# Patient Record
Sex: Female | Born: 1975 | Race: Black or African American | Hispanic: No | Marital: Single | Smoking: Former smoker
Health system: Southern US, Community
[De-identification: ages and names within clinical notes are randomized; demographics above are authoritative.]

## PROBLEM LIST (undated history)

## (undated) DIAGNOSIS — K5090 Crohn's disease, unspecified, without complications: Secondary | ICD-10-CM

## (undated) DIAGNOSIS — C50919 Malignant neoplasm of unspecified site of unspecified female breast: Secondary | ICD-10-CM

## (undated) DIAGNOSIS — K625 Hemorrhage of anus and rectum: Secondary | ICD-10-CM

## (undated) DIAGNOSIS — R197 Diarrhea, unspecified: Secondary | ICD-10-CM

## (undated) DIAGNOSIS — C801 Malignant (primary) neoplasm, unspecified: Secondary | ICD-10-CM

## (undated) DIAGNOSIS — D500 Iron deficiency anemia secondary to blood loss (chronic): Secondary | ICD-10-CM

## (undated) DIAGNOSIS — E559 Vitamin D deficiency, unspecified: Secondary | ICD-10-CM

## (undated) DIAGNOSIS — D259 Leiomyoma of uterus, unspecified: Secondary | ICD-10-CM

## (undated) HISTORY — DX: Crohn's disease, unspecified, without complications: K50.90

## (undated) HISTORY — DX: Diarrhea, unspecified: R19.7

## (undated) HISTORY — DX: Hemorrhage of anus and rectum: K62.5

## (undated) HISTORY — DX: Leiomyoma of uterus, unspecified: D25.9

## (undated) HISTORY — DX: Iron deficiency anemia secondary to blood loss (chronic): D50.0

## (undated) HISTORY — DX: Vitamin D deficiency, unspecified: E55.9

---

## 1998-01-23 ENCOUNTER — Emergency Department (HOSPITAL_COMMUNITY): Admission: EM | Admit: 1998-01-23 | Discharge: 1998-01-23 | Payer: Self-pay | Admitting: Emergency Medicine

## 1998-06-06 ENCOUNTER — Emergency Department (HOSPITAL_COMMUNITY): Admission: EM | Admit: 1998-06-06 | Discharge: 1998-06-06 | Payer: Self-pay | Admitting: Emergency Medicine

## 1998-06-06 ENCOUNTER — Inpatient Hospital Stay (HOSPITAL_COMMUNITY): Admission: AD | Admit: 1998-06-06 | Discharge: 1998-06-06 | Payer: Self-pay | Admitting: *Deleted

## 1998-06-09 ENCOUNTER — Inpatient Hospital Stay (HOSPITAL_COMMUNITY): Admission: AD | Admit: 1998-06-09 | Discharge: 1998-06-09 | Payer: Self-pay | Admitting: Obstetrics

## 1998-06-15 ENCOUNTER — Inpatient Hospital Stay (HOSPITAL_COMMUNITY): Admission: AD | Admit: 1998-06-15 | Discharge: 1998-06-15 | Payer: Self-pay | Admitting: Obstetrics

## 1998-08-27 ENCOUNTER — Inpatient Hospital Stay (HOSPITAL_COMMUNITY): Admission: AD | Admit: 1998-08-27 | Discharge: 1998-08-27 | Payer: Self-pay | Admitting: Obstetrics

## 1998-10-28 ENCOUNTER — Inpatient Hospital Stay (HOSPITAL_COMMUNITY): Admission: AD | Admit: 1998-10-28 | Discharge: 1998-10-28 | Payer: Self-pay | Admitting: Obstetrics

## 1999-01-24 ENCOUNTER — Inpatient Hospital Stay (HOSPITAL_COMMUNITY): Admission: AD | Admit: 1999-01-24 | Discharge: 1999-01-26 | Payer: Self-pay | Admitting: Obstetrics

## 1999-12-19 ENCOUNTER — Encounter (INDEPENDENT_AMBULATORY_CARE_PROVIDER_SITE_OTHER): Payer: Self-pay

## 1999-12-19 ENCOUNTER — Other Ambulatory Visit: Admission: RE | Admit: 1999-12-19 | Discharge: 1999-12-19 | Payer: Self-pay | Admitting: Obstetrics

## 2002-04-09 ENCOUNTER — Encounter: Payer: Self-pay | Admitting: Emergency Medicine

## 2002-04-09 ENCOUNTER — Emergency Department (HOSPITAL_COMMUNITY): Admission: EM | Admit: 2002-04-09 | Discharge: 2002-04-10 | Payer: Self-pay | Admitting: Emergency Medicine

## 2002-04-15 ENCOUNTER — Emergency Department (HOSPITAL_COMMUNITY): Admission: EM | Admit: 2002-04-15 | Discharge: 2002-04-15 | Payer: Self-pay | Admitting: Emergency Medicine

## 2004-04-07 ENCOUNTER — Emergency Department (HOSPITAL_COMMUNITY): Admission: EM | Admit: 2004-04-07 | Discharge: 2004-04-07 | Payer: Self-pay | Admitting: Emergency Medicine

## 2004-12-16 ENCOUNTER — Encounter: Payer: Self-pay | Admitting: Gastroenterology

## 2004-12-19 ENCOUNTER — Encounter (INDEPENDENT_AMBULATORY_CARE_PROVIDER_SITE_OTHER): Payer: Self-pay | Admitting: *Deleted

## 2004-12-19 ENCOUNTER — Encounter (INDEPENDENT_AMBULATORY_CARE_PROVIDER_SITE_OTHER): Payer: Self-pay | Admitting: Specialist

## 2004-12-19 ENCOUNTER — Encounter: Payer: Self-pay | Admitting: Gastroenterology

## 2004-12-19 ENCOUNTER — Ambulatory Visit (HOSPITAL_COMMUNITY): Admission: RE | Admit: 2004-12-19 | Discharge: 2004-12-19 | Payer: Self-pay | Admitting: Gastroenterology

## 2005-06-13 ENCOUNTER — Encounter: Payer: Self-pay | Admitting: Gastroenterology

## 2005-12-04 ENCOUNTER — Encounter: Payer: Self-pay | Admitting: Gastroenterology

## 2005-12-26 ENCOUNTER — Encounter: Payer: Self-pay | Admitting: Gastroenterology

## 2006-01-01 ENCOUNTER — Encounter: Payer: Self-pay | Admitting: Gastroenterology

## 2006-01-18 ENCOUNTER — Encounter: Admission: RE | Admit: 2006-01-18 | Discharge: 2006-01-18 | Payer: Self-pay | Admitting: Gastroenterology

## 2006-03-06 ENCOUNTER — Encounter: Payer: Self-pay | Admitting: Gastroenterology

## 2006-06-22 ENCOUNTER — Encounter: Payer: Self-pay | Admitting: Gastroenterology

## 2007-06-24 ENCOUNTER — Emergency Department (HOSPITAL_COMMUNITY): Admission: EM | Admit: 2007-06-24 | Discharge: 2007-06-24 | Payer: Self-pay | Admitting: Emergency Medicine

## 2009-08-27 ENCOUNTER — Encounter (INDEPENDENT_AMBULATORY_CARE_PROVIDER_SITE_OTHER): Payer: Self-pay | Admitting: *Deleted

## 2009-08-27 ENCOUNTER — Ambulatory Visit: Payer: Self-pay | Admitting: Gastroenterology

## 2009-08-27 DIAGNOSIS — K625 Hemorrhage of anus and rectum: Secondary | ICD-10-CM

## 2009-08-27 DIAGNOSIS — K5090 Crohn's disease, unspecified, without complications: Secondary | ICD-10-CM | POA: Insufficient documentation

## 2009-08-30 ENCOUNTER — Ambulatory Visit: Payer: Self-pay | Admitting: Gastroenterology

## 2009-08-30 LAB — CONVERTED CEMR LAB
ALT: 12 units/L (ref 0–35)
AST: 15 units/L (ref 0–37)
Albumin: 3.4 g/dL — ABNORMAL LOW (ref 3.5–5.2)
Alkaline Phosphatase: 103 units/L (ref 39–117)
BUN: 8 mg/dL (ref 6–23)
Basophils Absolute: 0 10*3/uL (ref 0.0–0.1)
Basophils Relative: 0.4 % (ref 0.0–3.0)
Bilirubin, Direct: 0.1 mg/dL (ref 0.0–0.3)
CO2: 29 meq/L (ref 19–32)
Calcium: 9 mg/dL (ref 8.4–10.5)
Chloride: 106 meq/L (ref 96–112)
Creatinine, Ser: 0.7 mg/dL (ref 0.4–1.2)
Eosinophils Absolute: 0.1 10*3/uL (ref 0.0–0.7)
Eosinophils Relative: 0.9 % (ref 0.0–5.0)
Ferritin: 17 ng/mL (ref 10.0–291.0)
Folate: 9.2 ng/mL
GFR calc non Af Amer: 127.01 mL/min (ref >60)
Glucose, Bld: 88 mg/dL (ref 70–99)
HCT: 35.3 % — ABNORMAL LOW (ref 36.0–46.0)
Hemoglobin: 11.6 g/dL — ABNORMAL LOW (ref 12.0–15.0)
Iron: 32 ug/dL — ABNORMAL LOW (ref 42–145)
Lymphocytes Relative: 25.6 % (ref 12.0–46.0)
Lymphs Abs: 2 10*3/uL (ref 0.7–4.0)
MCHC: 33 g/dL (ref 30.0–36.0)
MCV: 82.6 fL (ref 78.0–100.0)
Magnesium: 2.3 mg/dL (ref 1.5–2.5)
Monocytes Absolute: 0.9 10*3/uL (ref 0.1–1.0)
Monocytes Relative: 11.1 % (ref 3.0–12.0)
Neutro Abs: 4.8 10*3/uL (ref 1.4–7.7)
Neutrophils Relative %: 62 % (ref 43.0–77.0)
Platelets: 352 10*3/uL (ref 150.0–400.0)
Potassium: 4.1 meq/L (ref 3.5–5.1)
RBC: 4.28 M/uL (ref 3.87–5.11)
RDW: 14 % (ref 11.5–14.6)
Saturation Ratios: 10.5 % — ABNORMAL LOW (ref 20.0–50.0)
Sed Rate: 11 mm/hr (ref 0–22)
Sodium: 141 meq/L (ref 135–145)
TSH: 0.89 microintl units/mL (ref 0.35–5.50)
Total Bilirubin: 0.7 mg/dL (ref 0.3–1.2)
Total Protein: 7.1 g/dL (ref 6.0–8.3)
Transferrin: 217.4 mg/dL (ref 212.0–360.0)
Vitamin B-12: 600 pg/mL (ref 211–911)
WBC: 7.7 10*3/uL (ref 4.5–10.5)

## 2009-09-01 ENCOUNTER — Encounter: Payer: Self-pay | Admitting: Gastroenterology

## 2009-09-02 ENCOUNTER — Telehealth: Payer: Self-pay | Admitting: Gastroenterology

## 2009-09-13 ENCOUNTER — Telehealth (INDEPENDENT_AMBULATORY_CARE_PROVIDER_SITE_OTHER): Payer: Self-pay | Admitting: *Deleted

## 2010-05-10 NOTE — Letter (Signed)
Summary: [facility]  [facility]   Imported By: Phillis Knack 09/29/2009 15:21:19  _____________________________________________________________________  External Attachment:    Type:   Image     Comment:   External Document

## 2010-05-10 NOTE — Letter (Signed)
Summary: Patient Notice- Colon Biospy Results  [facility]  [place], [place] [address]   Phone: [phone]  Fax: [phone]        Sep 01, 2009 MRN: [id number]    [name] [place], [place]  [address]    Dear [name],  I am pleased to inform you that the biopsies taken during your recent colonoscopy did not show any evidence of cancer upon pathologic examination.  Additional information/recommendations:  __No further action is needed at this time.  Please follow-up with      your primary care physician for your other healthcare needs.  _x_Please call [phone] to schedule a return visit to review      your condition.  _x_Continue with the treatment plan as outlined on the day of your      exam.  __You should have a repeat colonoscopy examination for this problem           in _ years.  Please call us if you are having persistent problems or have questions about your condition that have not been fully answered at this time.  Sincerely,  Sable Feil MD [facility]   This letter has been electronically signed by your physician.  Appended Document: Patient Notice- Colon Biospy Results letter mailed.

## 2010-05-10 NOTE — Progress Notes (Signed)
Summary: instructions  Phone Note Call from Patient Call back at Home Phone [phone]   Caller: Patient Call For: Dr. Sharlett Iles Reason for Call: Talk to Nurse Summary of Call: pt had COL Monday and thinks she was told to stop "something", but she doesnt recall what the "something" was Initial call taken by: Lucien Mons,  Sep 02, 2009 8:20 AM  Follow-up for Phone Call        LM for pt to call.   Butch Penny Surface RN  Sep 02, 2009 8:33 AM  Pt asking if she still needs to use the canasa suppositories?  Pt has noticed that her urine is darker than normal.  No other urinary symptoms.  Follow-up by: Alberteen Spindle RN,  Sep 02, 2009 8:40 AM  Additional Follow-up for Phone Call Additional follow up Details #1::        stop supp...see me next week Additional Follow-up by: Sable Feil MD Rubie Maid 9:28 AM    Additional Follow-up for Phone Call Additional follow up Details #2::    patient was wonder is the flagyl was causing the darker urine not the supp.  Follow-up by: Bernita Buffy CMA Deborra Medina),  Sep 02, 2009 4:18 PM  Additional Follow-up for Phone Call Additional follow up Details #3:: Details for Additional Follow-up Action Taken: talked with pt.  She states urine has returned to normal color.  Has return OV scheduled. Additional Follow-up by: Alberteen Spindle RN,  September 08, 2009 11:48 AM

## 2010-05-10 NOTE — Letter (Signed)
Summary: [facility]  [facility]   Imported By: Phillis Knack 09/29/2009 15:22:03  _____________________________________________________________________  External Attachment:    Type:   Image     Comment:   External Document

## 2010-05-10 NOTE — Procedures (Signed)
Summary: Colonoscopy  Patient: [name] Note: All result statuses are Final unless otherwise noted.  Tests: (1) Colonoscopy (COL)   COL Colonoscopy           [facility].     [place], [place]  [id number]           COLONOSCOPY PROCEDURE REPORT           PATIENT:  [name], [name]  MR#:  [id number]     BIRTHDATE:  [birth year], 33 yrs. old  GENDER:  female     ENDOSCOPIST:  Loralee Pacas. Sharlett Iles, MD, [facility]     REF. BY:     PROCEDURE DATE:  08/30/2009     PROCEDURE:  Colonoscopy with biopsy     ASA CLASS:  Class II     INDICATIONS:  FOBT positive stool, Crohn's disease, unexplained     diarrhea     MEDICATIONS:   Fentanyl 100 mcg IV, Versed 9 mg IV           DESCRIPTION OF PROCEDURE:   After the risks benefits and     alternatives of the procedure were thoroughly explained, informed     consent was obtained.  Digital rectal exam was performed and     revealed no abnormalities.   The LB CF-H180AL [id number] endoscope     was introduced through the anus and advanced to the terminal ileum     which was intubated for a short distance, without limitations.     The quality of the prep was excellent, using MoviPrep.  The     instrument was then slowly withdrawn as the colon was fully     examined.     <<PROCEDUREIMAGES>>     FINDINGS:  There were mucosal changes consistent with Crohn's     disease. SEVERE COLITIS FROM MID SIGMOID AREA TO     CECUM.GRANULAR,ULCERATED,PSEUDO-POLYS AND MARKED EXUDATE.BX.     DONE.THE ILEUM APPEARS NORMAL AS DOES THE RECTUM AND DISTAL     SIGMOID AREA.   Retrof     lexed views in the rectum revealed no abnormalities.    The scope     was then withdrawn from the patient and the procedure completed.           COMPLICATIONS:  None     ENDOSCOPIC IMPRESSION:     1) Colitis - Crohn's     SEVERE CROHN'S COLITIS AND PROBABLE C.DIFF INFECTION,R/O CMV.     RECOMMENDATIONS:     #1.METRONIDAZOLE 500 MG BID FOR 2 WEEKS.#50, REFILL X  3.     #2.CONTINUE PO AMINOSAICYLATES.CONSIDER 6MP RX ON RTC VISIT 2     WEEKS,     REPEAT EXAM:  No           ______________________________     Loralee Pacas. Sharlett Iles, MD, [facility]           CC:  Freda Munro, MD           n.     Lorrin Mais:   Loralee Pacas. Patterson at 08/30/2009 04:06 PM           [name], [phone]  Note: An exclamation mark (!) indicates a result that was not dispersed into the flowsheet. Document Creation Date: 08/30/2009 4:07 PM _______________________________________________________________________  (1) Order result status: Final Collection or observation date-time: 08/30/2009 15:56 Requested date-time:  Receipt date-time:  Reported date-time:  Referring Physician:   Ordering Physician:  Verl Blalock [id number]) Specimen Source:  Source: Tawanna Cooler Order Number: [id number] Lab site:

## 2010-05-10 NOTE — Letter (Signed)
Summary: [facility]  [facility]   Imported By: Phillis Knack 09/29/2009 15:22:50  _____________________________________________________________________  External Attachment:    Type:   Image     Comment:   External Document

## 2010-05-10 NOTE — Progress Notes (Signed)
  Phone Note Other Incoming   Request: Send information Summary of Call: Records received from [facility]. 31 pages of records forwarded to Dr. Sharlett Iles for review.

## 2010-05-10 NOTE — Procedures (Signed)
Summary: Colon and path   Colonoscopy  Procedure date:  12/19/2004  Findings:      Location:  [facility].    NAME:  [name], [name]              ACCOUNT NO.:  [id number]   MEDICAL RECORD NO.:  [id number]          PATIENT TYPE:  AMB   LOCATION:  ENDO                         FACILITY:  [facility]   PHYSICIAN:  Lear Ng, MDDATE OF BIRTH:  [birth year]   DATE OF PROCEDURE:  12/19/2004  DATE OF DISCHARGE:                                 OPERATIVE REPORT   PROCEDURE:  Colonoscopy.   INDICATIONS FOR PROCEDURE:  Rectal bleeding.   HISTORY:  The patient is a 35 year old black female who presented to my  clinic on December 16, 2004, with a two month history of rectal bleeding  with associated abdominal pain and diarrhea along with a 20 pound weight  loss.  She had a C. diff test which was negative by her primary care  physician and had negative celiac sprue panel done by her primary care  physician, and she was sent to my clinic for further evaluation.  Upon  further discussion, she had a negative travel history and denied tenesmus.  She did report the onset of arthritis in her left foot which was new, as  well.  She is unsure of her family history since she is adopted.  Blood  counts done by my office revealed a hematocrit of 32 with an MCV of 80.  She  has undergone this colonoscopy to evaluate for possible inflammatory bowel  disease.   DESCRIPTION OF PROCEDURE:  Informed consent was obtained from the patient  prior to initiation of procedure, discussing the risks, benefits, and  alternatives.  A PCF-160AL colonoscope was used for this procedure.  Conscious sedation was used and a total of 60 mg Demerol and 6 mg Versed was  given.   FINDINGS:  Rectal exam was normal with normal rectal tone and no stool on  the gloved finger.  The colonoscope was inserted into a well prepped colon  and advanced to the cecum where the ileocecal valve and appendiceal orifice  were  identified.  The terminal ileum was intubated which was normal in  appearance and random biopsies were taken in the terminal ileum.  Careful  withdrawal of the colonoscope revealed scattered ulcers throughout the colon  mainly concentrated in the ascending, transverse, descending, and proximal  sigmoid colon.  Edema was noted in these areas as well as friability.  These  areas represented lesions with areas of normal mucosa surrounding these  scattered ulcers.  Random biopsies were taken throughout the colon.  The  distal sigmoid and rectosigmoid junction as well as the rectum was spared  with normal vascular pattern and no ulcers or edema noted.  Retroflexion was  normal.  The colonoscope was withdrawn and confirmed the above findings.   DIAGNOSIS:  Colitis likely secondary to Crohn's disease - status post  multiple random biopsies.   PLAN:  1.  Start Asacol 800 mg t.i.d.  2.  Metronidazole 500 mg t.i.d. x 7 days.  3.  The patient will follow up  in my clinic at [place] in 2-3 weeks.      Lear Ng, MD  Electronically Signed     VCS/MEDQ  D:  12/19/2004  T:  12/19/2004  Job:  [id number]      [facility] Surgical Pathology - STATUS: Final             By: Leatrice Jewels,      Perform Date: 11Sep06 00:00  Ordered By: Golden Pop MD , PROVIDER         Ordered Date: 11Sep06 16:37  Facility: [facility]                              Department: [place]  Service Report Text  [facility]   [place], [place] [address]   [phone]    REPORT OF SURGICAL PATHOLOGY    Case #: [id number]   Patient Name: [name]   PID: [id number]   Pathologist: Jaquelyn Bitter A. Rodney Cruise, MD   DOB/Age 10-08-1975 (Age: 63) Gender: F   Date Taken: 12/19/2004   Date Received: 12/19/2004    FINAL DIAGNOSIS    ***MICROSCOPIC EXAMINATION AND DIAGNOSIS***    1. TERMINAL ILEUM, BIOPSIES: FRAGMENTS OF SMALL INTESTINAL   MUCOSA WITH REACTIVE LYMPHOID AGGREGATES.  NO ACTIVE ILEITIS,   GRANULOMAS OR DYSPLASIA IDENTIFIED.    2. COLON, RIGHT, BIOPSIES: MILD ACTIVE CHRONIC MUCOSAL COLITIS.   NO DYSPLASIA IDENTIFIED.    3. COLON, TRANSVERSE, BIOPSIES: ACTIVE CHRONIC MUCOSAL COLITIS.   NO DYSPLASIA IDENTIFIED.    4. COLON, LEFT, BIOPSIES: ACTIVE CHRONIC MUCOSAL COLITIS WITH   FOCAL EXTENSION OF INFLAMMATION TO SUBMUCOSAL TISSUES. NO   GRANULOMAS OR DYSPLASIA IDENTIFIED.    5. RECTUM, BIOPSIES: FRAGMENTS OF BENIGN COLONIC MUCOSA WITH   FOCAL REACTIVE LYMPHOID AGGREGATES. NO ACTIVE MUCOSAL   INFLAMMATION, GRANULOMAS OR DYSPLASIA IDENTIFIED.    COMMENT   The right, transverse and left colon biopsies show fragments of   colonic mucosa with features consistent with active chronic   idiopathic inflammatory bowel disease. Although in some of the   biopsy fragments the changes are diffuse, much of the   inflammation including ulceration is focal and in the left colon   biopsy there is extension of inflammation into the submucosa.   These features are more in favor of Crohn disease than ulcerative   colitis. No dysplasia is identified. Clinical and endoscopic   correlation would be helpful. (EAA:glk 12-20-04)    gk   Date Reported: 12/20/2004 Louanne Belton. Rodney Cruise, MD   *** Electronically Signed Out By EAA ***    Clinical information   Blood in stool; colitis (cm)    specimen(s) obtained   1: Ileum, biopsy, terminal   2: Colon, biopsy, right side   3: Colon, biopsy, transverse   4: Colon, biopsy, left side   5: Rectum, biopsy    Gross Description   1. Received in formalin are tan, soft tissue fragments that are   submitted in toto. Number: multiple.   Size: each 0.3 cm.    2. Received in formalin are tan, soft tissue fragments that are   submitted in toto. Number: multiple.   Size: 0.2 to 0.3 cm.    3. Received in formalin are tan, soft tissue fragments that are   submitted in toto. Number: two.   Size: each 0.3 cm.    4. Received in formalin  are  tan, soft tissue fragments that are   submitted in toto. Number: multiple.   Size: 0.2 to 0.3 cm.    5. Received in formalin are tan, soft tissue fragments that are   submitted in toto. Number: two.   Size: each 0.3 cm. (GP:caf 12/19/04)    cf/

## 2010-05-10 NOTE — Assessment & Plan Note (Signed)
Summary: flare up colitis...erm   History of Present Illness Visit Type: new patient Primary GI MD: Verl Blalock MD [place] Primary Provider: n/a Chief Complaint: crohn's flare, pt has seen Dr. Michail Sermon in the past, but has not seen him since 2007, last colonoscopy in 2006 History of Present Illness:   35 year old F. American female with a past history of Crohn's colitis diagnosed at time of colonoscopy in September 2006 by Dr. Michail Sermon at [facility]. His report is reviewed today as his pathologic report which confirmed granulomatous colitis. She had an excellent response to Asacol 800 mg t.i.d. which she took for several months, then self discontinued. She's had no problems until the last 4-6 weeks when she again has developed crampy lower abdominal pain, fatigue, distention, and watery diarrhea. Followup with Dr. Ouida Sills, her gynecologist, had been unremarkable.  She has suffered from recurrent oral stomatitis but denies current problems with this, denies arthritis, skin rashes, fever, chills, or other systemic complaints except for mild fatigue. She has normal menstrual periods and denies any chance she could be pregnant. She does use a fair amount of Advil at the time of her menses. Otherwise she is denies abuse of NSAIDs or alcohol. She was a heavy smoker until 3 years ago. Family history is noncontributory since she is adopted.   GI Review of Systems    Reports abdominal pain and  weight loss.   Weight loss of 10 pounds over 6 weeks.   Denies acid reflux, belching, bloating, chest pain, dysphagia with liquids, dysphagia with solids, heartburn, loss of appetite, nausea, vomiting, vomiting blood, and  weight gain.      Reports change in bowel habits and  constipation.     Denies anal fissure, black tarry stools, diarrhea, diverticulosis, fecal incontinence, heme positive stool, hemorrhoids, irritable bowel syndrome, jaundice, light color stool, liver problems, rectal bleeding, and  rectal  pain. Preventive Screening-Counseling & Management  Alcohol-Tobacco     Smoking Status: quit      Drug Use:  no.      Current Medications (verified): 1)  None  Allergies (verified): No Known Drug Allergies  Past History:  Past medical, surgical, family and social histories (including risk factors) reviewed for relevance to current acute and chronic problems.  Past Medical History: Crohns Disease  Past Surgical History: None  Family History: Reviewed history and no changes required. Family History of Breast Cancer: Mat Aunt No FH of Colon Cancer:  Social History: Reviewed history and no changes required. 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  Alcohol Use - no Illicit Drug Use - no Smoking Status:  quit Drug Use:  no  Review of Systems       The patient complains of fatigue.  The patient denies allergy/sinus, anemia, anxiety-new, arthritis/joint pain, back pain, blood in urine, breast changes/lumps, change in vision, confusion, cough, coughing up blood, depression-new, fainting, fever, headaches-new, hearing problems, heart murmur, heart rhythm changes, itching, menstrual pain, muscle pains/cramps, night sweats, nosebleeds, pregnancy symptoms, shortness of breath, skin rash, sleeping problems, sore throat, swelling of feet/legs, swollen lymph glands, thirst - excessive, urination - excessive, urination changes/pain, urine leakage, and voice change.   General:  Complains of fatigue and weight loss; denies fever, chills, sweats, anorexia, weakness, malaise, and sleep disorder; she has alleged 10 pound weight loss over the last 2 months.. Eyes:  Denies blurring, diplopia, irritation, discharge, vision loss, scotoma, eye pain, and photophobia. ENT:  Denies earache, ear discharge, tinnitus, decreased hearing, nasal  congestion, loss of smell, nosebleeds, sore throat, hoarseness, and difficulty swallowing. CV:  Denies chest pains, angina, palpitations,  syncope, dyspnea on exertion, orthopnea, PND, peripheral edema, and claudication. Resp:  Denies dyspnea at rest, dyspnea with exercise, cough, sputum, wheezing, coughing up blood, and pleurisy. GI:  Complains of abdominal pain, gas/bloating, and diarrhea; denies difficulty swallowing, pain on swallowing, nausea, indigestion/heartburn, vomiting, vomiting blood, jaundice, constipation, change in bowel habits, bloody BM's, black BMs, and fecal incontinence. GU:  Denies urinary burning, blood in urine, nocturnal urination, urinary frequency, urinary incontinence, abnormal vaginal bleeding, amenorrhea, menorrhagia, vaginal discharge, pelvic pain, genital sores, painful intercourse, and decreased libido. MS:  Denies joint pain / LOM, joint swelling, joint stiffness, joint deformity, low back pain, muscle weakness, muscle cramps, muscle atrophy, leg pain at night, leg pain with exertion, and shoulder pain / LOM hand / wrist pain (CTS). Derm:  Denies rash, itching, dry skin, hives, moles, warts, and unhealing ulcers. Neuro:  Complains of frequent headaches; denies weakness, paralysis, abnormal sensation, seizures, syncope, tremors, vertigo, transient blindness, frequent falls, difficulty walking, headache, sciatica, radiculopathy other:, restless legs, memory loss, and confusion. Psych:  Denies depression, anxiety, memory loss, suicidal ideation, hallucinations, paranoia, phobia, and confusion. Endo:  Denies cold intolerance, heat intolerance, polydipsia, polyphagia, polyuria, unusual weight change, and hirsutism. Heme:  Denies bruising, bleeding, enlarged lymph nodes, and pagophagia. Allergy:  Denies hives, rash, sneezing, hay fever, and recurrent infections.  Vital Signs:  Patient profile:   34 year old female Height:      65 inches Weight:      139 pounds BMI:     23.21 Pulse rate:   80 / minute Pulse rhythm:   regular BP sitting:   108 / 70  (left arm) Cuff size:   regular  Vitals Entered By:  Abelino Derrick CMA Deborra Medina) (Aug 27, 2009 9:47 AM)  Physical Exam  General:  Well developed, well nourished, no acute distress.healthy appearing.   Head:  Normocephalic and atraumatic. Eyes:  PERRLA, no icterus.exam deferred to patient's ophthalmologist.   Mouth:  No deformity or lesions, dentition normal. Neck:  Supple; no masses or thyromegaly.enlarged thyroid.   Lungs:  Clear throughout to auscultation. Heart:  Regular rate and rhythm; no murmurs, rubs,  or bruits. Abdomen:  Soft, nontender and nondistended. No masses, hepatosplenomegaly or hernias noted. Normal bowel sounds.Her abdomen is distended with some mild tenderness of the sigmoid colon. Rectal:  Normal exam.Loose liquidy stool which is markedly guaiac positive Msk:  Symmetrical with no gross deformities. Normal posture. Pulses:  Normal pulses noted. Extremities:  No clubbing, cyanosis, edema or deformities noted. Neurologic:  Alert and  oriented x4;  grossly normal neurologically. Cervical Nodes:  No significant cervical adenopathy. Psych:  Alert and cooperative. Normal mood and affect.anxious.     Impression & Recommendations:  Problem # 1:  CROHN'S DISEASE (ICD-555.9) Assessment Unchanged Obvious flare of her inflammatory colitis, perhaps precipitated by NSAID use. Her thyroid is enlarged we will check thyroid profile and other labs. I have started Lialba 2.4 g twice a day and Canasa 1 g suppositories at bedtime, p.r.n. Imodium every 12 hours, and avoidance of NSAIDs. Colonoscopy also has been scheduled for reevaluation of her inflammatory bowel disease. I suspect she does have associated iron deficiency anemia from GI and gynecologic blood loss. Orders: TLB-CBC Platelet - w/Differential (85025-CBCD) TLB-BMP (Basic Metabolic Panel-BMET) (26948-NIOEVOJ) TLB-Hepatic/Liver Function Pnl (80076-HEPATIC) TLB-TSH (Thyroid Stimulating Hormone) (84443-TSH) TLB-B12, Serum-Total ONLY (50093-G18) TLB-Ferritin  (29937-JIR) TLB-Folic Acid (Folate) (67893-YBO) TLB-IBC Pnl (Iron/FE;Transferrin) (83550-IBC)  TLB-Magnesium (Mg) (83735-MG) TLB-Sedimentation Rate (ESR) (85652-ESR)  Problem # 2:  DIARRHEA (ICD-787.91) Assessment: Deteriorated  Orders: TLB-CBC Platelet - w/Differential (85025-CBCD) TLB-BMP (Basic Metabolic Panel-BMET) (06237-SEGBTDV) TLB-Hepatic/Liver Function Pnl (80076-HEPATIC) TLB-TSH (Thyroid Stimulating Hormone) (84443-TSH) TLB-B12, Serum-Total ONLY (76160-V37) TLB-Ferritin (10626-RSW) TLB-Folic Acid (Folate) (54627-OJJ) TLB-IBC Pnl (Iron/FE;Transferrin) (83550-IBC) TLB-Magnesium (Mg) (83735-MG) TLB-Sedimentation Rate (ESR) (85652-ESR)  Patient Instructions: 1)  Please go to the basement for lab work. 2)  You are scheduled for a colonoscopy. 3)  Begin Lialda two tabs two times a day  4)  Begin Canasa suppositories at bedtime. 5)  You may take Imodium every 12 hrs as needed for dairrhea. 6)  Do not take any aleve, motrin ect.  Tylenol may be used for pain if needed. 7)  Copy Sent To:Dr. Freda Munro gynecology  Appended Document: flare up colitis...erm    Clinical Lists Changes  Medications: Added new medication of MOVIPREP 100 GM  SOLR (PEG-KCL-NACL-NASULF-NA ASC-C) As per prep instructions. - Signed Added new medication of LIALDA 1.2 GM  TBEC (MESALAMINE) 2 tabs two times a day - Signed Added new medication of CANASA 1000 MG  SUPP (MESALAMINE) 1 q hs - Signed Added new medication of IMODIUM A-D 2 MG TABS (LOPERAMIDE HCL) q 12 hrs as needed Rx of MOVIPREP 100 GM  SOLR (PEG-KCL-NACL-NASULF-NA ASC-C) As per prep instructions.;  #1 x 0;  Signed;  Entered by: Alberteen Spindle RN;  Authorized by: Sable Feil MD [facility];  Method used: Electronically to [facility]. [place]. [phone]*, [address] [place], [place], [place]  [address], Ph: [phone] or [phone], Fax: [phone] Rx of LIALDA 1.2 GM  TBEC (MESALAMINE) 2 tabs two times a day;  #120 x 6;  Signed;   Entered by: Alberteen Spindle RN;  Authorized by: Sable Feil MD [facility];  Method used: Electronically to [facility]. [place]. [phone]*, [address] [place], [place], [place]  [address], Ph: [phone] or [phone], Fax: [phone] Rx of CANASA 1000 MG  SUPP (MESALAMINE) 1 q hs;  #30 x 3;  Signed;  Entered by: Alberteen Spindle RN;  Authorized by: Sable Feil MD [facility];  Method used: Electronically to [facility]. [place]. [phone]*, [address] [place], [place], [place]  [address], Ph: [phone] or [phone], Fax: [phone] Orders: Added new Test order of Colonoscopy (Colon) - Signed    Prescriptions: CANASA 1000 MG  SUPP (MESALAMINE) 1 q hs  #30 x 3   Entered by:   Alberteen Spindle RN   Authorized by:   Sable Feil MD [facility]   Signed by:   Alberteen Spindle RN on 08/27/2009   Method used:   Electronically to        [facility]. [address] [phone]* (retail)       [address] [place], [place]  [address]       Ph: [phone] or [phone]       Fax: [phone]   RxID:   [id number] LIALDA 1.2 GM  TBEC (MESALAMINE) 2 tabs two times a day  #120 x 6   Entered by:   Alberteen Spindle RN   Authorized by:   Sable Feil MD [facility]   Signed by:   Alberteen Spindle RN on 08/27/2009   Method used:   Electronically to        [facility]. [address] [phone]* (retail)  [place], [place]  [address]       Ph: [phone] or [phone]       Fax: [phone]   RxID:   [id number] MOVIPREP 100 GM  SOLR (PEG-KCL-NACL-NASULF-NA ASC-C) As per prep instructions.  #1 x 0   Entered by:   Alberteen Spindle RN   Authorized by:   Sable Feil MD [facility]   Signed by:   Alberteen Spindle RN on 08/27/2009   Method used:   Electronically to        [facility]. [address] [phone]* (retail)       [address]       [place], [place]  [address]       Ph: [phone] or [phone]       Fax: [phone]   RxID:    [id number]

## 2010-05-10 NOTE — Letter (Signed)
Summary: [facility]  [facility]   Imported By: Phillis Knack 09/29/2009 15:20:31  _____________________________________________________________________  External Attachment:    Type:   Image     Comment:   External Document

## 2010-05-10 NOTE — Letter (Signed)
Summary: [facility]  [facility]   Imported By: Phillis Knack 09/29/2009 15:23:41  _____________________________________________________________________  External Attachment:    Type:   Image     Comment:   External Document

## 2010-05-10 NOTE — Letter (Signed)
Summary: [facility] Instructions  [facility]  [place], [place] [address]   Phone: [phone]  Fax: [phone]       [name]    02-21-1976    MRN: [id number]        Procedure Day /Date: Monday, 08/30/09     Arrival Time: 3:00      Procedure Time: 4:00     Location of Procedure:                    [name]  [place] (4th Floor)   [place]   Starting todaydo not eat nuts, seeds, popcorn, corn, beans, peas,  salads, or any raw vegetables.  Do not take any fiber supplements (e.g. Metamucil, Citrucel, and Benefiber).  THE DAY BEFORE YOUR PROCEDURE         DATE: 08/29/09  SWH:QPRFFM  1.  Drink clear liquids the entire day-NO SOLID FOOD  2.  Do not drink anything colored red or purple.  Avoid juices with pulp.  No orange juice.  3.  Drink at least 64 oz. (8 glasses) of fluid/clear liquids during the day to prevent dehydration and help the prep work efficiently.  CLEAR LIQUIDS INCLUDE: Water Jello Ice Popsicles Tea (sugar ok, no milk/cream) Powdered fruit flavored drinks Coffee (sugar ok, no milk/cream) Gatorade Juice: apple, white grape, white cranberry  Lemonade Clear bullion, consomm, broth Carbonated beverages (any kind) Strained chicken noodle soup Hard Candy                             4.  In the morning, mix first dose of MoviPrep solution:    Empty 1 Pouch A and 1 Pouch B into the disposable container    Add lukewarm drinking water to the top line of the container. Mix to dissolve    Refrigerate (mixed solution should be used within 24 hrs)  5.  Begin drinking the prep at 5:00 p.m. The MoviPrep container is divided by 4 marks.   Every 15 minutes drink the solution down to the next mark (approximately 8 oz) until the full liter is complete.   6.  Follow completed prep with 16 oz of clear liquid of your choice (Nothing red or purple).  Continue to drink clear liquids until  bedtime.  7.  Before going to bed, mix second dose of MoviPrep solution:    Empty 1 Pouch A and 1 Pouch B into the disposable container    Add lukewarm drinking water to the top line of the container. Mix to dissolve    Refrigerate  THE DAY OF YOUR PROCEDURE      DATE: 08/30/09   DAY: Monday  Beginning at 11:00 a.m. (5 hours before procedure):         1. Every 15 minutes, drink the solution down to the next mark (approx 8 oz) until the full liter is complete.  2. Follow completed prep with 16 oz. of clear liquid of your choice.    3. You may drink clear liquids until 2:00 (2 HOURS BEFORE PROCEDURE).   MEDICATION INSTRUCTIONS  Unless otherwise instructed, you should take regular prescription medications with a small sip of water   as early as possible the morning of your procedure.                 OTHER INSTRUCTIONS  You will need a responsible adult at least  33 years of age to accompany you and drive you home.   This person must remain in the waiting room during your procedure.  Wear loose fitting clothing that is easily removed.  Leave jewelry and other valuables at home.  However, you may wish to bring a book to read or  an iPod/MP3 player to listen to music as you wait for your procedure to start.  Remove all body piercing jewelry and leave at home.  Total time from sign-in until discharge is approximately 2-3 hours.  You should go home directly after your procedure and rest.  You can resume normal activities the  day after your procedure.  The day of your procedure you should not:   Drive   Make legal decisions   Operate machinery   Drink alcohol   Return to work  You will receive specific instructions about eating, activities and medications before you leave.    The above instructions have been reviewed and explained to me by   _______________________    I fully understand and can verbalize these instructions _____________________________  Date _________

## 2010-05-10 NOTE — Letter (Signed)
Summary: [facility]  [facility]   Imported By: Phillis Knack 09/29/2009 15:19:56  _____________________________________________________________________  External Attachment:    Type:   Image     Comment:   External Document

## 2010-05-10 NOTE — Miscellaneous (Signed)
Summary: Flagyl order/Metronidazole for 2 wks  Clinical Lists Changes  Medications: Added new medication of METRONIDAZOLE 500 MG  TABS (METRONIDAZOLE) Take 1 twice a day times 14 days - Signed Rx of METRONIDAZOLE 500 MG  TABS (METRONIDAZOLE) Take 1 twice a day times 14 days;  #50 x 3;  Signed;  Entered by: Thurston Pounds RN II;  Authorized by: Sable Feil MD [facility];  Method used: Electronically to [facility]. [place]. [phone]*, [address] [place], [place], [place]  [address], Ph: [phone] or [phone], Fax: [phone] Observations: Added new observation of ALLERGY REV: Done (08/30/2009 16:10) Added new observation of NKA: T (08/30/2009 16:10)    Prescriptions: METRONIDAZOLE 500 MG  TABS (METRONIDAZOLE) Take 1 twice a day times 14 days  #50 x 3   Entered by:   Thurston Pounds RN II   Authorized by:   Sable Feil MD [facility]   Signed by:   Thurston Pounds RN II on 08/30/2009   Method used:   Electronically to        [facility]. [address] [phone]* (retail)       [address]       [place], [place]  [address]       Ph: [phone] or [phone]       Fax: [phone]   RxID:   [id number]

## 2010-05-13 NOTE — Procedures (Signed)
Summary: Colonoscopy/[place]  Colonoscopy/[place]   Imported By: Phillis Knack 09/29/2009 15:25:32  _____________________________________________________________________  External Attachment:    Type:   Image     Comment:   External Document

## 2010-08-26 ENCOUNTER — Other Ambulatory Visit: Payer: Self-pay | Admitting: Gastroenterology

## 2010-08-26 MED ORDER — MESALAMINE 1.2 G PO TBEC: DELAYED_RELEASE_TABLET | ORAL | Status: DC

## 2010-08-26 NOTE — Op Note (Signed)
[name], [name]              ACCOUNT NO.:  [id number]   MEDICAL RECORD NO.:  [id number]          PATIENT TYPE:  AMB   LOCATION:  ENDO                         FACILITY:  [place]   PHYSICIAN:  Lear Ng, MDDATE OF BIRTH:  Jun 03, 1975   DATE OF PROCEDURE:  12/19/2004  DATE OF DISCHARGE:                                 OPERATIVE REPORT   PROCEDURE:  Colonoscopy.   INDICATIONS FOR PROCEDURE:  Rectal bleeding.   HISTORY:  The patient is a 35 year old black female who presented to my  clinic on December 16, 2004, with a two month history of rectal bleeding  with associated abdominal pain and diarrhea along with a 20 pound weight  loss.  She had a C. diff test which was negative by her primary care  physician and had negative celiac sprue panel done by her primary care  physician, and she was sent to my clinic for further evaluation.  Upon  further discussion, she had a negative travel history and denied tenesmus.  She did report the onset of arthritis in her left foot which was new, as  well.  She is unsure of her family history since she is adopted.  Blood  counts done by my office revealed a hematocrit of 32 with an MCV of 80.  She  has undergone this colonoscopy to evaluate for possible inflammatory bowel  disease.   DESCRIPTION OF PROCEDURE:  Informed consent was obtained from the patient  prior to initiation of procedure, discussing the risks, benefits, and  alternatives.  A PCF-160AL colonoscope was used for this procedure.  Conscious sedation was used and a total of 60 mg Demerol and 6 mg Versed was  given.   FINDINGS:  Rectal exam was normal with normal rectal tone and no stool on  the gloved finger.  The colonoscope was inserted into a well prepped colon  and advanced to the cecum where the ileocecal valve and appendiceal orifice  were identified.  The terminal ileum was intubated which was normal in  appearance and random biopsies were taken in the terminal  ileum.  Careful  withdrawal of the colonoscope revealed scattered ulcers throughout the colon  mainly concentrated in the ascending, transverse, descending, and proximal  sigmoid colon.  Edema was noted in these areas as well as friability.  These  areas represented lesions with areas of normal mucosa surrounding these  scattered ulcers.  Random biopsies were taken throughout the colon.  The  distal sigmoid and rectosigmoid junction as well as the rectum was spared  with normal vascular pattern and no ulcers or edema noted.  Retroflexion was  normal.  The colonoscope was withdrawn and confirmed the above findings.   DIAGNOSIS:  Colitis likely secondary to Crohn's disease - status post  multiple random biopsies.   PLAN:  1.  Start Asacol 800 mg t.i.d.  2.  Metronidazole 500 mg t.i.d. x 7 days.  3.  The patient will follow up in my clinic at [place] in 2-3 weeks.      Lear Ng, MD  Electronically Signed     VCS/MEDQ  D:  12/19/2004  T:  12/19/2004  Job:  [id number]

## 2010-09-02 ENCOUNTER — Ambulatory Visit: Payer: Self-pay | Admitting: Gastroenterology

## 2010-09-02 ENCOUNTER — Telehealth: Payer: Self-pay | Admitting: *Deleted

## 2010-09-02 NOTE — Telephone Encounter (Signed)
Per Dr Sharlett Iles bill patient for same day no show / cx

## 2010-09-02 NOTE — Telephone Encounter (Signed)
Message copied by Sheral Flow on Fri Sep 02, 2010  9:23 AM ------      Message from: Webb Laws D      Created: Fri Sep 02, 2010  8:57 AM       Pt. resch'd her appt. from today until 09-09-10 b/c "she has a lot going on"

## 2010-09-09 ENCOUNTER — Ambulatory Visit: Payer: Self-pay | Admitting: Gastroenterology

## 2010-09-26 ENCOUNTER — Telehealth: Payer: Self-pay | Admitting: Gastroenterology

## 2010-09-27 ENCOUNTER — Ambulatory Visit: Payer: Self-pay | Admitting: Gastroenterology

## 2010-09-27 NOTE — Telephone Encounter (Signed)
Pt has been given a one time refill since 08/2010 she has no showed her appt in may and cancelled twice in June. I tried to call her phone number but its is not accepting incoming calls. Her meds have been denied until she KEEPS an office visit.

## 2010-10-05 ENCOUNTER — Telehealth: Payer: Self-pay | Admitting: Gastroenterology

## 2010-10-06 MED ORDER — MESALAMINE 1.2 G PO TBEC: DELAYED_RELEASE_TABLET | ORAL | Status: DC

## 2010-10-06 NOTE — Telephone Encounter (Signed)
Left message for pt to call back, see previous phone call from 09/26/2010.

## 2010-10-06 NOTE — Telephone Encounter (Signed)
i have sent in one month until her office visit she is upset that no rx has been sent but she was asked to have a return office visit 2 weeks after her colonoscopy in 08/2009. She has not followed up I have advised her that if she dont keep her appt she will no longer get refills.

## 2010-10-17 ENCOUNTER — Encounter: Payer: Self-pay | Admitting: Gastroenterology

## 2010-10-17 ENCOUNTER — Other Ambulatory Visit (INDEPENDENT_AMBULATORY_CARE_PROVIDER_SITE_OTHER): Payer: BC Managed Care – PPO

## 2010-10-17 ENCOUNTER — Ambulatory Visit (INDEPENDENT_AMBULATORY_CARE_PROVIDER_SITE_OTHER): Payer: BC Managed Care – PPO | Admitting: Gastroenterology

## 2010-10-17 VITALS — BP 120/80 | HR 68 | Ht 66.0 in | Wt 151.0 lb

## 2010-10-17 DIAGNOSIS — K5090 Crohn's disease, unspecified, without complications: Secondary | ICD-10-CM

## 2010-10-17 DIAGNOSIS — D500 Iron deficiency anemia secondary to blood loss (chronic): Secondary | ICD-10-CM

## 2010-10-17 DIAGNOSIS — Z862 Personal history of diseases of the blood and blood-forming organs and certain disorders involving the immune mechanism: Secondary | ICD-10-CM

## 2010-10-17 HISTORY — DX: Iron deficiency anemia secondary to blood loss (chronic): D50.0

## 2010-10-17 LAB — IBC PANEL
Iron: 33 ug/dL — ABNORMAL LOW (ref 42–145)
Transferrin: 275.9 mg/dL (ref 212.0–360.0)

## 2010-10-17 LAB — BASIC METABOLIC PANEL
CO2: 25 mEq/L (ref 19–32)
Calcium: 8.7 mg/dL (ref 8.4–10.5)
Potassium: 4 mEq/L (ref 3.5–5.1)
Sodium: 138 mEq/L (ref 135–145)

## 2010-10-17 LAB — HEPATIC FUNCTION PANEL
AST: 16 U/L (ref 0–37)
Albumin: 4 g/dL (ref 3.5–5.2)

## 2010-10-17 LAB — CBC WITH DIFFERENTIAL/PLATELET
Basophils Absolute: 0 10*3/uL (ref 0.0–0.1)
Hemoglobin: 11.4 g/dL — ABNORMAL LOW (ref 12.0–15.0)
Lymphocytes Relative: 22.8 % (ref 12.0–46.0)
Monocytes Relative: 6.4 % (ref 3.0–12.0)
Neutro Abs: 6.9 10*3/uL (ref 1.4–7.7)
Platelets: 250 10*3/uL (ref 150.0–400.0)
RDW: 14.4 % (ref 11.5–14.6)

## 2010-10-17 LAB — FERRITIN: Ferritin: 5.3 ng/mL — ABNORMAL LOW (ref 10.0–291.0)

## 2010-10-17 LAB — FOLATE: Folate: 16.5 ng/mL (ref >5.9)

## 2010-10-17 LAB — VITAMIN B12: Vitamin B-12: 547 pg/mL (ref 211–911)

## 2010-10-17 MED ORDER — MESALAMINE 1.2 G PO TBEC: DELAYED_RELEASE_TABLET | ORAL | Status: DC

## 2010-10-17 NOTE — Progress Notes (Signed)
This is a 35 year old African American with Crohn's colitis currently in remission on Lialba 2.4 g a day. She reports regular bowel movements without melena, hematochezia, abdominal pain, upper GI or hepatobiliary complaints. Her diet consists of a" blood type specific diet" without a history of lactose intolerance or malabsorption of other food products. Her appetite is good and her weight is stable. There is no history of arthritis, skin rashes, mouth sores, or systemic complaints. She has had problems with affording her medications.  Current Medications, Allergies, Past Medical History, Past Surgical History, Family History and Social History were reviewed in [employer] record.  Pertinent Review of Systems Negative... history of iron deficiency but regular menstrual periods, she relates regular GYN checkups.   Physical Exam: Awake and alert in no acute distress. I cannot appreciate stigmata of chronic liver disease. Chest is clear cardiac exam is unremarkable. There is no hepatosplenomegaly, abdominal masses or tenderness. Bowel sounds are normal. Mental status is clear.    Assessment and Plan: Crohn's colitis in remission  on aminosalicylate therapy. I have ordered a CBC a metabolic profile and anemia profile. I have refilled her Lialba 2.4 g a day with yearly followup or when necessary as needed. Encounter Diagnosis  Name Primary?  . Crohn's disease Yes

## 2010-10-17 NOTE — Patient Instructions (Signed)
Your prescription(s) have been sent to you pharmacy.  Please go to the basement today for your labs.  Please make sure you see Dr Sharlett Iles every year.

## 2010-10-18 ENCOUNTER — Telehealth: Payer: Self-pay | Admitting: *Deleted

## 2010-10-18 NOTE — Telephone Encounter (Signed)
Message copied by Sheral Flow on Tue Oct 18, 2010 11:57 AM ------      Message from: Sharlett Iles, DAVID R      Created: Tue Oct 18, 2010 11:55 AM       OTC IRON DAILY...

## 2010-10-18 NOTE — Telephone Encounter (Signed)
Left message on machine to buy OTC iron and take one a day,.

## 2011-01-02 LAB — URINALYSIS, ROUTINE W REFLEX MICROSCOPIC
Bilirubin Urine: NEGATIVE
Glucose, UA: NEGATIVE
Ketones, ur: NEGATIVE
Protein, ur: 100 — AB

## 2011-01-02 LAB — URINE CULTURE: Colony Count: >=100000

## 2011-01-02 LAB — URINE MICROSCOPIC-ADD ON

## 2012-01-23 ENCOUNTER — Emergency Department (HOSPITAL_COMMUNITY)
Admission: EM | Admit: 2012-01-23 | Discharge: 2012-01-23 | Disposition: A | Discharge status: Home / Self Care | Payer: 59 | Source: Home / Self Care | Attending: Emergency Medicine | Admitting: Emergency Medicine

## 2012-01-23 ENCOUNTER — Encounter (HOSPITAL_COMMUNITY): Payer: Self-pay | Admitting: *Deleted

## 2012-01-23 DIAGNOSIS — K0889 Other specified disorders of teeth and supporting structures: Secondary | ICD-10-CM

## 2012-01-23 DIAGNOSIS — R51 Headache: Secondary | ICD-10-CM

## 2012-01-23 DIAGNOSIS — R110 Nausea: Secondary | ICD-10-CM

## 2012-01-23 DIAGNOSIS — K089 Disorder of teeth and supporting structures, unspecified: Secondary | ICD-10-CM

## 2012-01-23 MED ORDER — PENICILLIN V POTASSIUM 500 MG PO TABS: 500.0000 mg | ORAL_TABLET | Freq: Four times a day (QID) | ORAL | Status: DC

## 2012-01-23 MED ORDER — HYDROCODONE-ACETAMINOPHEN 5-325 MG PO TABS: 1.0000 | ORAL_TABLET | Freq: Four times a day (QID) | ORAL | Status: DC | PRN

## 2012-01-23 NOTE — ED Notes (Signed)
Pt states that her left side of jaw radiating to head  Has been hurting for the past 2 days, possible bad tooth on upper left - no previous prob with bp

## 2012-01-23 NOTE — ED Provider Notes (Signed)
History     CSN: [id number]  Arrival date & time 01/23/12  1250   First MD Initiated Contact with Patient 01/23/12 1330      Chief Complaint  Patient presents with  . Headache    (Consider location/radiation/quality/duration/timing/severity/associated sxs/prior treatment) HPI Pt reports that 1 year ago had two of her back molars in her lower left jaw removed.  Have been asymptomatic until 2 weeks ago at which time they started hurting.  She reports a throbbing pain in her left lower jaw that is constant and radiates to the left temple.  Pain has been gradually worsening over past two weeks.  She denies any fevers, chills, diarrhea, abd pain, visual changes, or pain elsewhere.  She has been taking tylenol for pain (can't take NSAIDs due to Crohn's disease) which has been working at least somewhat.  She does admit to some problems with intermittent nausea due to pain.  Past Medical History  Diagnosis Date  . Crohn's disease   . Rectal bleeding   . Diarrhea     History reviewed. No pertinent past surgical history.  Family History  Problem Relation Age of Onset  . Breast cancer Maternal Aunt   . Colon cancer Neg Hx     History  Substance Use Topics  . Smoking status: Former Research scientist (life sciences)  . Smokeless tobacco: Not on file  . Alcohol Use: No    OB History    Grav Para Term Preterm Abortions TAB SAB Ect Mult Living                  Review of Systems  Constitutional: Negative for fever, chills, appetite change and fatigue.  HENT: Negative for facial swelling, neck pain and neck stiffness.   Eyes: Negative for pain and visual disturbance.  Gastrointestinal: Positive for nausea. Negative for vomiting and abdominal pain.  Neurological: Positive for headaches. Negative for dizziness and speech difficulty.    Allergies  Review of patient's allergies indicates no known allergies.  Home Medications   Current Outpatient Rx  Name Route Sig Dispense Refill  . FERROUS FUM-IRON  POLYSACCH 162-115.2 MG PO CAPS Oral Take 1 capsule by mouth daily with breakfast.      . HYDROCODONE-ACETAMINOPHEN 5-325 MG PO TABS Oral Take 1 tablet by mouth every 6 (six) hours as needed for pain. 40 tablet 0  . LOPERAMIDE HCL 2 MG PO TABS Oral Take 2 mg by mouth as needed.      Marland Kitchen MESALAMINE 1000 MG RE SUPP Rectal Place 1,000 mg rectally at bedtime.      Marland Kitchen MESALAMINE 1.2 G PO TBEC  Take two tablets by mouth two times a day. 120 tablet 6  . PENICILLIN V POTASSIUM 500 MG PO TABS Oral Take 1 tablet (500 mg total) by mouth 4 (four) times daily. 40 tablet 0    BP 161/106  Pulse 90  Temp 98.3 F (36.8 C) (Oral)  Resp 16  SpO2 100%  LMP 01/07/2012  Physical Exam  Constitutional: She appears well-developed and well-nourished.  HENT:  Head: Normocephalic and atraumatic.  Right Ear: External ear normal.  Left Ear: External ear normal.  Mouth/Throat: Oropharynx is clear and moist.       There is pain on palpation over the back left lower jaw.  No obvious warmth, erythema, or fluctuance.  No redness noted inside mouth although jaw is tender to touch.  No obvious tooth breakdown  Eyes: Conjunctivae normal and EOM are normal. Pupils are equal, round, and reactive  to light.  Neck: Normal range of motion. Neck supple.  Lymphadenopathy:    She has no cervical adenopathy.    ED Course  Procedures (including critical care time)  Labs Reviewed - No data to display No results found.   1. Pain, dental       MDM  Dental pain, possibly from infectious source.  Will treat with PCN and pain meds.  Gave patient resources for dental care.  Also rec patient find PCP for bp f/u.        Vivi Ferns, MD 01/23/12 [phone]

## 2012-01-23 NOTE — ED Provider Notes (Signed)
Medical screening examination/treatment/procedure(s) were performed by a resident physician and as supervising physician I was immediately available for consultation/collaboration.  Additionally, I saw the patient independently, verified the history, examined the patient and discussed the treatment plan with the resident. The pain seems to be originating from her teeth, yet we could not see any evidence of an abscess or an infection. We recommended that she followup with a dentist, and she was given antibiotics and pain medication.  Philipp Deputy, M.D.    Harden Mo, MD 01/23/12 2153

## 2012-02-29 ENCOUNTER — Telehealth: Payer: Self-pay | Admitting: Gastroenterology

## 2012-02-29 DIAGNOSIS — K5090 Crohn's disease, unspecified, without complications: Secondary | ICD-10-CM

## 2012-02-29 NOTE — Telephone Encounter (Signed)
According to her pharmacy she filled her Lialda in Sept 2012, December 2012, and April 2013.    I have left a message for the patient to call back to discuss

## 2012-03-01 MED ORDER — MESALAMINE 1.2 G PO TBEC: DELAYED_RELEASE_TABLET | ORAL | Status: DC

## 2012-03-01 NOTE — Telephone Encounter (Signed)
Patient with a history of crohn's.  Her last office visit was 10/2011.  She reports she did well for many months and she stopped her Lialda.  She reports that she has had 2 weeks of lower abdominal pain and diarrhea.  Denies rectal bleeding.  She reports that she did have some Lialda left and started herself back on it last Thursday, but is out of pills now.  She is advised that she will need an office visit for evaluation.  Patient will come in and see Nicoletta Ba PA on Tuesday at 3:00.  I have given her one box of Lialda samples to come pick up until Tuesday.

## 2012-03-04 ENCOUNTER — Ambulatory Visit: Payer: 59 | Admitting: Physician Assistant

## 2012-03-19 ENCOUNTER — Telehealth: Payer: Self-pay | Admitting: Gastroenterology

## 2012-03-19 NOTE — Telephone Encounter (Signed)
Pt reports a Crohn's flare; can't be seen today, but will come in tomorrow and see Amy. Informed her we cannot order meds until she is seen. Pt reports she mixed up her last appt; states she was having a miscarriage.

## 2012-03-20 ENCOUNTER — Ambulatory Visit (INDEPENDENT_AMBULATORY_CARE_PROVIDER_SITE_OTHER): Payer: 59 | Admitting: Physician Assistant

## 2012-03-20 ENCOUNTER — Other Ambulatory Visit (INDEPENDENT_AMBULATORY_CARE_PROVIDER_SITE_OTHER): Payer: 59

## 2012-03-20 ENCOUNTER — Encounter: Payer: Self-pay | Admitting: Physician Assistant

## 2012-03-20 VITALS — BP 110/64 | HR 104 | Ht 64.0 in | Wt 149.0 lb

## 2012-03-20 DIAGNOSIS — R197 Diarrhea, unspecified: Secondary | ICD-10-CM

## 2012-03-20 DIAGNOSIS — K5090 Crohn's disease, unspecified, without complications: Secondary | ICD-10-CM

## 2012-03-20 LAB — C-REACTIVE PROTEIN: CRP: 3.6 mg/dL (ref 0.5–20.0)

## 2012-03-20 LAB — CBC WITH DIFFERENTIAL/PLATELET
Basophils Absolute: 0 10*3/uL (ref 0.0–0.1)
Eosinophils Absolute: 0.1 10*3/uL (ref 0.0–0.7)
HCT: 32.7 % — ABNORMAL LOW (ref 36.0–46.0)
Lymphs Abs: 2 10*3/uL (ref 0.7–4.0)
MCHC: 32.2 g/dL (ref 30.0–36.0)
Monocytes Absolute: 1 10*3/uL (ref 0.1–1.0)
Monocytes Relative: 9.6 % (ref 3.0–12.0)
Neutro Abs: 7.4 10*3/uL (ref 1.4–7.7)
Platelets: 366 10*3/uL (ref 150.0–400.0)
RDW: 15.2 % — ABNORMAL HIGH (ref 11.5–14.6)

## 2012-03-20 MED ORDER — MESALAMINE 1.2 G PO TBEC: DELAYED_RELEASE_TABLET | ORAL | Status: DC

## 2012-03-20 MED ORDER — METRONIDAZOLE 250 MG PO TABS: 250.0000 mg | ORAL_TABLET | Freq: Four times a day (QID) | ORAL | Status: DC

## 2012-03-20 NOTE — Progress Notes (Signed)
Subjective:    Patient ID: [name], female    DOB: 1975/11/01, 36 y.o.   MRN: [id number]  HPI [name] is a pleasant 36 year old African American female known to Dr. Sharlett Iles who has history of Crohns' colitis which was  initially was diagnosed 6 or 7 years ago . She last had colonoscopy in May of 2011 and at that time was noted to have severe colitis from the mid sigmoid to the cecum with ulceration and pseudopolyps and marked exudate. The ileum appeared normal as did the rectum and distal sigmoid colon. Biopsies were consistent with Crohn's disease and there was no evidence of pseudomembranous colitis. She was treated with Lialda at that time as well as a course of Flagyl, and then went into remission. She was last seen in July of 2012 at that time had remained only all the and was doing well with no symptoms She says she eventually stop taking the lead all the last spring except for a short period of time when she was stressed. She started having problems again about 3 weeks ago with diarrhea and lower abdominal pain. Unfortunately she had found out around that same time that she was pregnant and then had a miscarriage over Thanksgiving-she was only [redacted] weeks pregnant at that time. She also had an ER visit in mid October with tooth pain and was given a course of penicillin which she says she did not finish. She called  here after her symptoms recurred and was given samples of Lialda  but has since run out. She says while she was on the Lialda she did feel a little bit better but has continued to have diarrhea. Currently she's having 3-4 watery bowel movements per day no melena or hematochezia. She does not feel that she's had any fever or chills, her energy level is been pretty good, and her weight has been stable. She says she's been having persistent pain in the left side of her abdomen which radiates across her lower abdomen after meals.    Review of Systems  Constitutional: Negative.   HENT:  Negative.   Eyes: Negative.   Respiratory: Negative.   Cardiovascular: Negative.   Gastrointestinal: Positive for abdominal pain and diarrhea.  Genitourinary: Negative.   Musculoskeletal: Negative.   Neurological: Negative.   Hematological: Negative.   Psychiatric/Behavioral: Negative.    Outpatient Prescriptions Prior to Visit  Medication Sig Dispense Refill  . HYDROcodone-acetaminophen (NORCO) 5-325 MG per tablet Take 1 tablet by mouth every 6 (six) hours as needed for pain.  40 tablet  0  . [DISCONTINUED] mesalamine (LIALDA) 1.2 G EC tablet Take two tablets by mouth two times a day.  24 tablet  0  . [DISCONTINUED] ferrous fumarate-iron polysaccharide complex (TANDEM) 162-115.2 MG CAPS Take 1 capsule by mouth daily with breakfast.        . [DISCONTINUED] loperamide (IMODIUM A-D) 2 MG tablet Take 2 mg by mouth as needed.        . [DISCONTINUED] mesalamine (CANASA) 1000 MG suppository Place 1,000 mg rectally at bedtime.        . [DISCONTINUED] penicillin v potassium (VEETID) 500 MG tablet Take 1 tablet (500 mg total) by mouth 4 (four) times daily.  40 tablet  0   Last reviewed on 03/20/2012 12:04 PM by Alfredia Ferguson, PA  No Known Allergies Patient Active Problem List  Diagnosis  . CROHN'S DISEASE  . DIARRHEA  . Crohn's disease  . History of iron deficiency anemia   History  Substance Use Topics  . Smoking status: Former Research scientist (life sciences)  . Smokeless tobacco: Never Used  . Alcohol Use: No   Objective:   Physical Exam well-developed young African American female in no acute distress, pleasant blood pressure 110/64 pulse 104 height 5 foot 4 weight 149. HEENT; nontraumatic normocephalic EOMI PERRLA sclera anicteric,Neck; Supple no JVD, Cardiovascular; regular rate and rhythm with S1-S2 no murmur or gallop, Pulmonary; clear bilaterally, Abdomen; soft she is tender in the left lower quadrant left mid quadrant mildly in the suprapubic area there is no guarding or rebound no palpable mass or  hepatosplenomegaly bowel sounds are active, Rectal; exam not done, Extremities; no clubbing cyanosis or edema skin warm and dry, Psych; mood and affect normal and appropriate.        Assessment & Plan:  #65 36 year old female with history of Crohn's colitis which had been in remission. Patient off we although over the past several months, and now presents with 3 week history of recurrent primarily left-sided abdominal pain and diarrhea. Suspect Crohn's exacerbation. Patient had also taken a short course of penicillin and will need to rule out superimposed C. Difficile  #2 recent miscarriage   Plan; we'll check CBC with differential and CRP today Stool for C. difficile by PCR Restart Lialda 1.2gm ; 2 tablets by mouth twice daily-patient is given samples and a prescription today Will also call and empiric course of Flagyl 250 mg by mouth 4 times daily x14 days Followup with Dr. Sharlett Iles in 3-4 weeks .

## 2012-03-20 NOTE — Patient Instructions (Addendum)
Your physician has requested that you go to the basement for the following lab work before leaving today: CBC/Diff, CRP, Stool studies  We have sent the following medications to your pharmacy for you to pick up at your convenience: Lialda and Flagyl in case needed once stool test results come back.  Follow-up with Dr. Sharlett Iles in 1 month.

## 2012-03-20 NOTE — Progress Notes (Signed)
I agree with asseesment and plans

## 2012-03-21 ENCOUNTER — Other Ambulatory Visit: Payer: 59

## 2012-03-21 ENCOUNTER — Other Ambulatory Visit: Payer: Self-pay | Admitting: Physician Assistant

## 2012-03-21 DIAGNOSIS — R197 Diarrhea, unspecified: Secondary | ICD-10-CM

## 2012-03-25 ENCOUNTER — Encounter: Payer: Self-pay | Admitting: *Deleted

## 2012-03-29 ENCOUNTER — Other Ambulatory Visit: Payer: Self-pay | Admitting: *Deleted

## 2012-03-29 MED ORDER — DICYCLOMINE HCL 10 MG PO CAPS: 10.0000 mg | ORAL_CAPSULE | Freq: Two times a day (BID) | ORAL | Status: DC | PRN

## 2012-04-25 ENCOUNTER — Ambulatory Visit (INDEPENDENT_AMBULATORY_CARE_PROVIDER_SITE_OTHER): Payer: 59 | Admitting: Gastroenterology

## 2012-04-25 ENCOUNTER — Other Ambulatory Visit (INDEPENDENT_AMBULATORY_CARE_PROVIDER_SITE_OTHER): Payer: 59

## 2012-04-25 ENCOUNTER — Encounter: Payer: Self-pay | Admitting: Gastroenterology

## 2012-04-25 VITALS — BP 118/70 | HR 103 | Ht 64.0 in | Wt 148.2 lb

## 2012-04-25 DIAGNOSIS — K529 Noninfective gastroenteritis and colitis, unspecified: Secondary | ICD-10-CM

## 2012-04-25 DIAGNOSIS — K5289 Other specified noninfective gastroenteritis and colitis: Secondary | ICD-10-CM

## 2012-04-25 DIAGNOSIS — D509 Iron deficiency anemia, unspecified: Secondary | ICD-10-CM

## 2012-04-25 DIAGNOSIS — K5010 Crohn's disease of large intestine without complications: Secondary | ICD-10-CM

## 2012-04-25 LAB — FOLATE: Folate: 13.2 ng/mL (ref >5.9)

## 2012-04-25 LAB — IBC PANEL: Transferrin: 246.5 mg/dL (ref 212.0–360.0)

## 2012-04-25 LAB — VITAMIN B12: Vitamin B-12: 867 pg/mL (ref 211–911)

## 2012-04-25 LAB — SEDIMENTATION RATE: Sed Rate: 29 mm/hr — ABNORMAL HIGH (ref 0–22)

## 2012-04-25 LAB — C-REACTIVE PROTEIN: CRP: 0.5 mg/dL (ref 0.5–20.0)

## 2012-04-25 MED ORDER — PEG-KCL-NACL-NASULF-NA ASC-C 100 G PO SOLR: 1.0000 | Freq: Once | ORAL | Status: DC

## 2012-04-25 NOTE — Patient Instructions (Addendum)
Colitis Colitis is inflammation of the colon. Colitis can be a short-term or long-standing (chronic) illness. Crohn's disease and ulcerative colitis are 2 types of colitis which are chronic. They usually require lifelong treatment. CAUSES  There are many different causes of colitis, including:  Viruses.  Germs (bacteria).  Medicine reactions. SYMPTOMS   Diarrhea.  Intestinal bleeding.  Pain.  Fever.  Throwing up (vomiting).  Tiredness (fatigue).  Weight loss.  Bowel blockage. DIAGNOSIS  The diagnosis of colitis is based on examination and stool or blood tests. X-rays, CT scan, and colonoscopy may also be needed. TREATMENT  Treatment may include:  Fluids given through the vein (intravenously).  Bowel rest (nothing to eat or drink for a period of time).  Medicine for pain and diarrhea.  Medicines (antibiotics) that kill germs.  Cortisone medicines.  Surgery. HOME CARE INSTRUCTIONS   Get plenty of rest.  Drink enough water and fluids to keep your urine clear or pale yellow.  Eat a well-balanced diet.  Call your caregiver for follow-up as recommended. SEEK IMMEDIATE MEDICAL CARE IF:   You develop chills.  You have an oral temperature above 102 F (38.9 C), not controlled by medicine.  You have extreme weakness, fainting, or dehydration.  You have repeated vomiting.  You develop severe belly (abdominal) pain or are passing bloody or tarry stools. MAKE SURE YOU:   Understand these instructions.  Will watch your condition.  Will get help right away if you are not doing well or get worse. Document Released: 05/04/2004 Document Revised: 06/19/2011 Document Reviewed: 07/30/2009 [facility] Patient Information 2013 [place].   Go to the basement for labs today  You have been scheduled for a colonoscopy with propofol. Please follow written instructions given to you at your visit today.  Please pick up your prep kit at the pharmacy within the next  1-3 days. If you use inhalers (even only as needed) or a CPAP machine, please bring them with you on the day of your procedure.

## 2012-04-25 NOTE — Progress Notes (Signed)
This is a 37 year old African American who has had Crohn's colitis since 2006 when she was under the care of [facility].  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  She was treated with metronidazole and Lialda and had good response, was lost to followup, and apparently was off of all medications for one year.  She relapsed approximately month ago perhaps related to a miscarriage.  She was seen by our physician's assistant and placed on Lialda 2.4 g a day and metronidazole, which she did not take.  The patient relates her to 3 weeks for her to improve but she currently denies abdominal pain, diarrhea, rectal bleeding, upper GI or hepatobiliary complaints.  She is back having normal menstrual periods but does have a history of iron deficiency anemia.  She denies abuse of NSAIDs, alcohol, or cigarettes.  There is no history of skin rashes, joint pains, oral stomatitis, anorexia, weight loss, or other systemic problems.  Current Medications, Allergies, Past Medical History, Past Surgical History, Family History and Social History were reviewed in [employer] record.  Pertinent Review of Systems Negative   Physical Exam: At pressure 118/70, pulse 103 and regular, and weight 148 with a BMI of 25.45.  Healthy-appearing patient in no distress.  Chest clear in she is in a regular rhythm without murmurs gallops or rubs.  Her abdomen shows no distention, organomegaly, masses, and bowel sounds are normal.  Inspection of rectum shows no evidence of perianal or perirectal inflammatory processes.  Rectal exam shows no masses or tenderness, and there is formed stool which is guaiac negative.  Mental status is normal.  Peripheral extremities are unremarkable.    Assessment and Plan: I'm surprised this patient is doing so well on standard dose by mouth amino salicylates with such severe Crohn's disease.  Obviously she  has mucosa disease and not fibro- stenosing or fistulizing  disease.  We will perform colonoscopy to assure mucosa with healing.  I have set her up for colonoscopy, continue her medications at current levels.  Anemia profile ordered for review along with CRP and sedimentation rate.  This patient may need more aggressive therapy, but appears to compliance has been an issue.  I did give her information today concern our local chapter of the Colitis- [employer]. Encounter Diagnosis  Name Primary?  . Colitis Yes

## 2012-05-06 ENCOUNTER — Encounter: Payer: 59 | Admitting: Gastroenterology

## 2012-05-08 ENCOUNTER — Encounter: Payer: 59 | Admitting: Gastroenterology

## 2012-05-29 ENCOUNTER — Telehealth: Payer: Self-pay | Admitting: *Deleted

## 2012-05-29 NOTE — Telephone Encounter (Signed)
Pt never called back. Mailed her a letter with her lab results asking her to call for a Tandem script.

## 2012-05-29 NOTE — Telephone Encounter (Signed)
Message copied by Lance Morin on Wed May 29, 2012  1:31 PM ------      Message from: PATTERSON, DAVID R      Created: Fri Apr 26, 2012  9:17 AM       Start Tandem daily chronically ------

## 2013-04-10 HISTORY — PX: COLONOSCOPY: SHX174

## 2013-05-01 ENCOUNTER — Other Ambulatory Visit: Payer: Self-pay | Admitting: Obstetrics and Gynecology

## 2013-08-22 ENCOUNTER — Telehealth: Payer: Self-pay | Admitting: Gastroenterology

## 2013-08-22 ENCOUNTER — Ambulatory Visit (INDEPENDENT_AMBULATORY_CARE_PROVIDER_SITE_OTHER): Payer: 59 | Admitting: Physician Assistant

## 2013-08-22 ENCOUNTER — Encounter: Payer: Self-pay | Admitting: Physician Assistant

## 2013-08-22 ENCOUNTER — Other Ambulatory Visit (INDEPENDENT_AMBULATORY_CARE_PROVIDER_SITE_OTHER): Payer: 59

## 2013-08-22 VITALS — BP 120/80 | HR 88 | Ht 63.25 in | Wt 151.0 lb

## 2013-08-22 DIAGNOSIS — K5010 Crohn's disease of large intestine without complications: Secondary | ICD-10-CM

## 2013-08-22 DIAGNOSIS — K5090 Crohn's disease, unspecified, without complications: Secondary | ICD-10-CM

## 2013-08-22 LAB — CBC WITH DIFFERENTIAL/PLATELET
BASOS ABS: 0 10*3/uL (ref 0.0–0.1)
Basophils Relative: 0.3 % (ref 0.0–3.0)
EOS ABS: 0.1 10*3/uL (ref 0.0–0.7)
Eosinophils Relative: 0.9 % (ref 0.0–5.0)
HEMATOCRIT: 28.5 % — AB (ref 36.0–46.0)
HEMOGLOBIN: 8.9 g/dL — AB (ref 12.0–15.0)
LYMPHS ABS: 1.7 10*3/uL (ref 0.7–4.0)
Lymphocytes Relative: 19.3 % (ref 12.0–46.0)
MCHC: 31.3 g/dL (ref 30.0–36.0)
MCV: 71.3 fl — AB (ref 78.0–100.0)
Monocytes Absolute: 0.9 10*3/uL (ref 0.1–1.0)
Monocytes Relative: 10.7 % (ref 3.0–12.0)
NEUTROS ABS: 5.9 10*3/uL (ref 1.4–7.7)
Neutrophils Relative %: 68.8 % (ref 43.0–77.0)
Platelets: 417 10*3/uL — ABNORMAL HIGH (ref 150.0–400.0)
RBC: 4 Mil/uL (ref 3.87–5.11)
RDW: 17.6 % — AB (ref 11.5–15.5)
WBC: 8.6 10*3/uL (ref 4.0–10.5)

## 2013-08-22 LAB — BASIC METABOLIC PANEL
BUN: 9 mg/dL (ref 6–23)
CALCIUM: 8.5 mg/dL (ref 8.4–10.5)
CO2: 27 meq/L (ref 19–32)
CREATININE: 0.8 mg/dL (ref 0.4–1.2)
Chloride: 103 mEq/L (ref 96–112)
GFR: 103.05 mL/min (ref >60.00)
Glucose, Bld: 91 mg/dL (ref 70–99)
Potassium: 3.6 mEq/L (ref 3.5–5.1)
Sodium: 136 mEq/L (ref 135–145)

## 2013-08-22 LAB — HIGH SENSITIVITY CRP: CRP HIGH SENSITIVITY: 44.49 mg/L — AB (ref 0.000–5.000)

## 2013-08-22 MED ORDER — MESALAMINE 1.2 G PO TBEC: DELAYED_RELEASE_TABLET | ORAL | Status: DC

## 2013-08-22 MED ORDER — GLYCOPYRROLATE 2 MG PO TABS: ORAL_TABLET | ORAL | Status: DC

## 2013-08-22 NOTE — Telephone Encounter (Signed)
Spoke with patient and she does not have a preference for GI MD. She states she has been having bloody diarrhea x 1 week every time she eats. Also, having cramping and bloating. She states she has not been taking her Lialda regularly because "my symptoms had gone away." Scheduled with Nicoletta Ba, PA today at 2:30 PM.

## 2013-08-22 NOTE — Progress Notes (Signed)
Subjective:    Patient ID: [name], female    DOB: [birth year], 38 y.o.   MRN: [id number]  HPI [name] is a pleasant 37 year old African American female known previously to Dr. Sharlett Iles who has a diagnosis of Crohn's colitis, initially diagnosed in 2008. She was last seen here in December of 2013 and last had colonoscopy in May of 2011 at which time she had a severe colitis from the mid sigmoid to the cecum with ulcerations and pseudopolyps and marked exudate. Path showed chronic active mucosal colitis consistent with IBD. Fortunately she has done well and has only required mesalamine therapy. She had been on Lee all the 4.8 g daily but says she stopped taking the medicine when she ran out of 5 or 6 months ago. She said she had been feeling well for a long time and wasn't sure that she needed it. She did not have any colitis symptoms until over the past 2 weeks when she started noticing some abdominal bloating lower abdominal cramping and is now having diarrheal stools with 3-4 bowel movements per day. She saw small amounts of blood in her stool yesterday. She has not had any associated fever or chills. Her appetite has been fine her weight has been stable. No complaints of nausea. She does have some mild fatigue and says she has noticed some aching in her legs recently as well. Patient was supposed to have a colonoscopy done in 2013 after she was seen but did not go through with that.   Review of Systems  Constitutional: Positive for fatigue.  HENT: Negative.   Eyes: Negative.   Respiratory: Negative.   Cardiovascular: Negative.   Gastrointestinal: Positive for abdominal pain, diarrhea and blood in stool.  Endocrine: Negative.   Genitourinary: Negative.   Musculoskeletal: Positive for arthralgias.  Skin: Negative.   Allergic/Immunologic: Negative.   Neurological: Positive for facial asymmetry.  Hematological: Negative.   Psychiatric/Behavioral: Negative.    Outpatient  Prescriptions Prior to Visit  Medication Sig Dispense Refill  . mesalamine (LIALDA) 1.2 G EC tablet Take 2 tablets twice a day  120 tablet  11  . HYDROcodone-acetaminophen (NORCO) 5-325 MG per tablet Take 1 tablet by mouth every 6 (six) hours as needed for pain.  40 tablet  0  . peg 3350 powder (MOVIPREP) 100 G SOLR Take 1 kit (100 g total) by mouth once.  1 kit  0   No facility-administered medications prior to visit.      No Known Allergies Patient Active Problem List   Diagnosis Date Noted  . Crohn's disease 10/17/2010  . History of iron deficiency anemia 10/17/2010  . DIARRHEA 08/27/2009   History  Substance Use Topics  . Smoking status: Former Smoker    Types: Cigarettes  . Smokeless tobacco: Never Used  . Alcohol Use: No  family history includes Breast cancer in her maternal aunt; Diabetes in her father and paternal aunt. There is no history of Colon cancer.  Objective:   Physical Exam  well-developed young African American female in no acute distress, blood pressure 120 /80,pulse 88,height 5 foot 3 weight 151. HEENT; nontraumatic normocephalic EOMI PERRLA sclera anicteric, Supple; no JVD, Cardiovascular; regular rate and rhythm with S1-S2 no murmur or gallop, Pulmonary; clear bilaterally, Abdomen; soft she is tender in the left mid quadrant left lower quadrant there is no guarding or rebound no palpable mass or hepatosplenomegaly bowel sounds are present, Rectal; exam not done, Extremities ;no clubbing cyanosis or edema skin warm and dry,  Psych ;mood and affect appropriate        Assessment & Plan:  #22 38 year old female with history of Crohn's colitis. Currently maintained on Lialda, and off all therapy over the past 6 months now with exacerbation with lower abdominal cramping small-volume hematochezia and diarrhea. Symptoms are most consistent with exacerbation of her known Crohn's colitis. #2 mild arthralgias possibly secondary to IBD #3 fatigue rule out anemia  Plan;  check CBC with differential, BMET and CRP Restart Lee all the 4.8 g daily (2 tablets twice daily) Start Robinul Forte 2 mg by mouth every morning with second dose as needed for urgency and cramping Discussed importance of maintenance therapy long-term for IBD, and importance of regular followup. She will be established with Dr. Carlean Purl Have scheduled for colonoscopy with Dr. Carlean Purl to reassess extent and severity of her disease. Procedure discussed in detail the patient and she is agreeable to proceed

## 2013-08-22 NOTE — Patient Instructions (Signed)
Please go to the basement level to have your labs drawn.  You have been scheduled for a colonoscopy with propofol. Please follow written instructions given to you at your visit today.  We have given you the sample Kit for the colonoscopy prep.  If you use inhalers (even only as needed), please bring them with you on the day of your procedure. Your physician has requested that you go to www.startemmi.com and enter the access code given to you at your visit today. This web site gives a general overview about your procedure. However, you should still follow specific instructions given to you by our office regarding your preparation for the procedure.  We sent prescriptions to [facility] [address]. Lialda 2. Robinul Forte

## 2013-08-22 NOTE — Progress Notes (Signed)
Agree w/ [name].

## 2013-08-25 ENCOUNTER — Other Ambulatory Visit: Payer: Self-pay | Admitting: *Deleted

## 2013-08-25 DIAGNOSIS — D649 Anemia, unspecified: Secondary | ICD-10-CM

## 2013-08-25 MED ORDER — FUSION PLUS PO CAPS: ORAL_CAPSULE | ORAL | Status: DC

## 2013-08-27 ENCOUNTER — Encounter: Payer: Self-pay | Admitting: Internal Medicine

## 2013-09-08 ENCOUNTER — Telehealth: Payer: Self-pay | Admitting: Internal Medicine

## 2013-09-08 ENCOUNTER — Other Ambulatory Visit: Payer: Self-pay | Admitting: *Deleted

## 2013-09-08 NOTE — Telephone Encounter (Signed)
I called the pharmacy and called in the prescriptions for the Lialda and the Robinul forte.  I originally sent them to the pharmacy electronically on 08-22-2013. The paienti had not called me since her visit on the 15th to let me know she didn't get her medications.  I left the patient a message advising I called them into the pharmacy today.  [facility] [address]

## 2013-09-16 ENCOUNTER — Encounter: Payer: 59 | Admitting: Internal Medicine

## 2013-09-17 ENCOUNTER — Encounter: Payer: Self-pay | Admitting: Internal Medicine

## 2013-09-17 ENCOUNTER — Ambulatory Visit (AMBULATORY_SURGERY_CENTER): Payer: 59 | Admitting: Internal Medicine

## 2013-09-17 ENCOUNTER — Other Ambulatory Visit (INDEPENDENT_AMBULATORY_CARE_PROVIDER_SITE_OTHER): Payer: 59

## 2013-09-17 VITALS — BP 135/92 | HR 90 | Temp 98.9°F | Resp 20 | Ht 63.0 in | Wt 151.0 lb

## 2013-09-17 DIAGNOSIS — D649 Anemia, unspecified: Secondary | ICD-10-CM

## 2013-09-17 DIAGNOSIS — K5289 Other specified noninfective gastroenteritis and colitis: Secondary | ICD-10-CM

## 2013-09-17 DIAGNOSIS — K5010 Crohn's disease of large intestine without complications: Secondary | ICD-10-CM

## 2013-09-17 DIAGNOSIS — K50112 Crohn's disease of large intestine with intestinal obstruction: Secondary | ICD-10-CM | POA: Insufficient documentation

## 2013-09-17 DIAGNOSIS — K50819 Crohn's disease of both small and large intestine with unspecified complications: Secondary | ICD-10-CM | POA: Insufficient documentation

## 2013-09-17 DIAGNOSIS — Z1211 Encounter for screening for malignant neoplasm of colon: Secondary | ICD-10-CM

## 2013-09-17 HISTORY — DX: Crohn's disease of large intestine with intestinal obstruction: K50.112

## 2013-09-17 LAB — CBC WITH DIFFERENTIAL/PLATELET
BASOS ABS: 0 10*3/uL (ref 0.0–0.1)
Basophils Relative: 0.4 % (ref 0.0–3.0)
EOS ABS: 0 10*3/uL (ref 0.0–0.7)
Eosinophils Relative: 0.6 % (ref 0.0–5.0)
HCT: 29.4 % — ABNORMAL LOW (ref 36.0–46.0)
Hemoglobin: 9.1 g/dL — ABNORMAL LOW (ref 12.0–15.0)
Lymphocytes Relative: 21.6 % (ref 12.0–46.0)
Lymphs Abs: 1.7 10*3/uL (ref 0.7–4.0)
MCHC: 31 g/dL (ref 30.0–36.0)
MCV: 70.2 fl — AB (ref 78.0–100.0)
MONO ABS: 0.7 10*3/uL (ref 0.1–1.0)
Monocytes Relative: 9.2 % (ref 3.0–12.0)
Neutro Abs: 5.4 10*3/uL (ref 1.4–7.7)
Neutrophils Relative %: 68.2 % (ref 43.0–77.0)
PLATELETS: 377 10*3/uL (ref 150.0–400.0)
RBC: 4.19 Mil/uL (ref 3.87–5.11)
RDW: 18.5 % — AB (ref 11.5–15.5)
WBC: 7.9 10*3/uL (ref 4.0–10.5)

## 2013-09-17 NOTE — Patient Instructions (Addendum)
There is active colitis from Crohn's disease in your colon.   Please stay on the L[facility] - I also want you to go for blood work today and we will call with results and follow-up plans.  It will be very important to maintain regular visits so we can get this disease under control.  I appreciate the opportunity to care for you. [name], MD, FACG   YOU HAD AN ENDOSCOPIC PROCEDURE TODAY AT [facility]: Refer to the procedure report that was given to you for any specific questions about what was found during the examination.  If the procedure report does not answer your questions, please call your gastroenterologist to clarify.  If you requested that your care partner not be given the details of your procedure findings, then the procedure report has been included in a sealed envelope for you to review at your convenience later.  YOU SHOULD EXPECT: Some feelings of bloating in the abdomen. Passage of more gas than usual.  Walking can help get rid of the air that was put into your GI tract during the procedure and reduce the bloating. If you had a lower endoscopy (such as a colonoscopy or flexible sigmoidoscopy) you may notice spotting of blood in your stool or on the toilet paper. If you underwent a bowel prep for your procedure, then you may not have a normal bowel movement for a few days.  DIET: Your first meal following the procedure should be a light meal and then it is ok to progress to your normal diet.  A half-sandwich or bowl of soup is an example of a good first meal.  Heavy or fried foods are harder to digest and may make you feel nauseous or bloated.  Likewise meals heavy in dairy and vegetables can cause extra gas to form and this can also increase the bloating.  Drink plenty of fluids but you should avoid alcoholic beverages for 24 hours.  ACTIVITY: Your care partner should take you home directly after the procedure.  You should plan to take it easy, moving slowly for  the rest of the day.  You can resume normal activity the day after the procedure however you should NOT DRIVE or use heavy machinery for 24 hours (because of the sedation medicines used during the test).    SYMPTOMS TO REPORT IMMEDIATELY: A gastroenterologist can be reached at any hour.  During normal business hours, 8:30 AM to 5:00 PM Monday through Friday, call [phone].  After hours and on weekends, please call the GI answering service at [phone] who will take a message and have the physician on call contact you.   Following lower endoscopy (colonoscopy or flexible sigmoidoscopy):  Excessive amounts of blood in the stool  Significant tenderness or worsening of abdominal pains  Swelling of the abdomen that is new, acute  Fever of 100F or higher   FOLLOW UP: If any biopsies were taken you will be contacted by phone or by letter within the next 1-3 weeks.  Call your gastroenterologist if you have not heard about the biopsies in 3 weeks.  Our staff will call the home number listed on your records the next business day following your procedure to check on you and address any questions or concerns that you may have at that time regarding the information given to you following your procedure. This is a courtesy call and so if there is no answer at the home number and we have not heard  from you through the emergency physician on call, we will assume that you have returned to your regular daily activities without incident.  SIGNATURES/CONFIDENTIALITY: You and/or your care partner have signed paperwork which will be entered into your electronic medical record.  These signatures attest to the fact that that the information above on your After Visit Summary has been reviewed and is understood.  Full responsibility of the confidentiality of this discharge information lies with you and/or your care-partner.  Labs today.  Wait biospy results.

## 2013-09-17 NOTE — Op Note (Signed)
[place]  [address] [place], [address]   COLONOSCOPY PROCEDURE REPORT  PATIENT: [name], [name]  MR#: [id number] BIRTHDATE: [birth year] , 37  yrs. old GENDER: Female ENDOSCOPIST: [name], MD, [facility] PROCEDURE DATE:  09/17/2013 PROCEDURE:   Colonoscopy with biopsy First Screening Colonoscopy - Avg.  risk and is 50 yrs.  old or older - No.  Prior Negative Screening - Now for repeat screening. N/A  History of Adenoma - Now for follow-up colonoscopy & has been > or = to 3 yrs.  N/A  Polyps Removed Today? No.  Recommend repeat exam, <10 yrs? Yes.  High risk (family or personal hx). ASA CLASS:   Class II INDICATIONS:High risk patient with previously diagnosed Crohn's disease: large intestine. MEDICATIONS: propofol (Diprivan) 192m IV, MAC sedation, administered by CRNA, and These medications were titrated to patient response per physician's verbal order  DESCRIPTION OF PROCEDURE:   After the risks benefits and alternatives of the procedure were thoroughly explained, informed consent was obtained.  A digital rectal exam revealed no abnormalities of the rectum.   The LB [id number]-[id number] endoscope was introduced through the anus and advanced to the terminal ileum which was intubated for a short distance. No adverse events experienced.   The quality of the prep was excellent using Suprep The instrument was then slowly withdrawn as the colon was fully examined.  COLON FINDINGS: Severe colitis - friable and nodular with ulcerations, contact friability.  20-60 cm.  Biopsies taken. Otherwise normal and terminal ileum normal.  Retroflexed views revealed no abnormalities. The time to cecum=1 minutes 47 seconds. Withdrawal time=4 minutes 09 seconds.  The scope was withdrawn and the procedure completed. COMPLICATIONS: There were no complications.  ENDOSCOPIC IMPRESSION: Severe colitis - friable and nodular with ulcerations, contact friability.  20-60 cm.   Biopsies taken.  Otherwise normal and terminal ileum normal.  RECOMMENDATIONS: 1.  Await biopsy results 2.   CBC, ferritin, B12 and vit D level today   eSigned:  CGatha Mayer MD, FAscension [facility]06/01/2014 4:08 PM   cc: The Patient

## 2013-09-17 NOTE — Progress Notes (Signed)
Called to room to assist during endoscopic procedure.  Patient ID and intended procedure confirmed with present staff. Received instructions for my participation in the procedure from the performing physician.  

## 2013-09-18 ENCOUNTER — Telehealth: Payer: Self-pay | Admitting: *Deleted

## 2013-09-18 LAB — VITAMIN D 25 HYDROXY (VIT D DEFICIENCY, FRACTURES): VITD: 13.87 ng/mL

## 2013-09-18 LAB — VITAMIN B12: VITAMIN B 12: 981 pg/mL — AB (ref 211–911)

## 2013-09-18 LAB — FERRITIN: FERRITIN: 5 ng/mL — AB (ref 10.0–291.0)

## 2013-09-18 NOTE — Telephone Encounter (Signed)
Telephone number given with prerecorded message no calls accepted at this time. No option to leave message.

## 2013-09-24 ENCOUNTER — Encounter: Payer: Self-pay | Admitting: Internal Medicine

## 2013-09-24 DIAGNOSIS — E559 Vitamin D deficiency, unspecified: Secondary | ICD-10-CM

## 2013-09-24 HISTORY — DX: Vitamin D deficiency, unspecified: E55.9

## 2013-09-24 NOTE — Progress Notes (Signed)
Quick Note:  Please let her know biopsies show colitis - as expected Labs show she has vitamin D deficiency and low iron  1) Be sure to take iron 2) vit D Rx 50,000 U weekly x 8 3) See me in August 4) Call back sooner if not improving  [facility] colon recall 2 years  ______

## 2013-09-25 ENCOUNTER — Other Ambulatory Visit: Payer: Self-pay

## 2013-09-25 MED ORDER — VITAMIN D3 1.25 MG (50000 UT) PO CAPS: 50000.0000 [IU] | ORAL_CAPSULE | ORAL | Status: DC

## 2013-09-29 ENCOUNTER — Telehealth: Payer: Self-pay | Admitting: Internal Medicine

## 2013-09-29 NOTE — Telephone Encounter (Signed)
See pathology results for details

## 2013-11-10 ENCOUNTER — Emergency Department (HOSPITAL_COMMUNITY)
Admission: EM | Admit: 2013-11-10 | Discharge: 2013-11-10 | Disposition: A | Discharge status: Home / Self Care | Payer: 59 | Attending: Emergency Medicine | Admitting: Emergency Medicine

## 2013-11-10 ENCOUNTER — Encounter (HOSPITAL_COMMUNITY): Payer: Self-pay | Admitting: Emergency Medicine

## 2013-11-10 DIAGNOSIS — Y92410 Unspecified street and highway as the place of occurrence of the external cause: Secondary | ICD-10-CM | POA: Insufficient documentation

## 2013-11-10 DIAGNOSIS — Z79899 Other long term (current) drug therapy: Secondary | ICD-10-CM | POA: Insufficient documentation

## 2013-11-10 DIAGNOSIS — Z8719 Personal history of other diseases of the digestive system: Secondary | ICD-10-CM | POA: Diagnosis not present

## 2013-11-10 DIAGNOSIS — Z87891 Personal history of nicotine dependence: Secondary | ICD-10-CM | POA: Insufficient documentation

## 2013-11-10 DIAGNOSIS — S76012A Strain of muscle, fascia and tendon of left hip, initial encounter: Secondary | ICD-10-CM

## 2013-11-10 DIAGNOSIS — Z8639 Personal history of other endocrine, nutritional and metabolic disease: Secondary | ICD-10-CM | POA: Insufficient documentation

## 2013-11-10 DIAGNOSIS — Y9389 Activity, other specified: Secondary | ICD-10-CM | POA: Diagnosis not present

## 2013-11-10 DIAGNOSIS — Z791 Long term (current) use of non-steroidal anti-inflammatories (NSAID): Secondary | ICD-10-CM | POA: Diagnosis not present

## 2013-11-10 DIAGNOSIS — M545 Low back pain, unspecified: Secondary | ICD-10-CM

## 2013-11-10 DIAGNOSIS — Z862 Personal history of diseases of the blood and blood-forming organs and certain disorders involving the immune mechanism: Secondary | ICD-10-CM | POA: Diagnosis not present

## 2013-11-10 DIAGNOSIS — IMO0002 Reserved for concepts with insufficient information to code with codable children: Secondary | ICD-10-CM | POA: Insufficient documentation

## 2013-11-10 DIAGNOSIS — Z8742 Personal history of other diseases of the female genital tract: Secondary | ICD-10-CM | POA: Insufficient documentation

## 2013-11-10 DIAGNOSIS — Z792 Long term (current) use of antibiotics: Secondary | ICD-10-CM | POA: Diagnosis not present

## 2013-11-10 MED ORDER — METHOCARBAMOL 1000 MG/10ML IJ SOLN
1000.0000 mg | Freq: Once | INTRAMUSCULAR | Status: AC
  Administered 2013-11-10: 1000 mg via INTRAMUSCULAR
  Filled 2013-11-10: qty 10

## 2013-11-10 MED ORDER — METHOCARBAMOL 500 MG PO TABS: 500.0000 mg | ORAL_TABLET | Freq: Two times a day (BID) | ORAL | Status: DC

## 2013-11-10 MED ORDER — HYDROCODONE-ACETAMINOPHEN 5-325 MG PO TABS: 1.0000 | ORAL_TABLET | ORAL | Status: DC | PRN

## 2013-11-10 MED ORDER — NAPROXEN 500 MG PO TABS: 500.0000 mg | ORAL_TABLET | Freq: Two times a day (BID) | ORAL | Status: DC

## 2013-11-10 MED ORDER — OXYCODONE-ACETAMINOPHEN 5-325 MG PO TABS
2.0000 | ORAL_TABLET | Freq: Once | ORAL | Status: AC
  Administered 2013-11-10: 2 via ORAL
  Filled 2013-11-10: qty 2

## 2013-11-10 NOTE — ED Provider Notes (Signed)
Medical screening examination/treatment/procedure(s) were performed by non-physician practitioner and as supervising physician I was immediately available for consultation/collaboration.    [name], MD 11/10/13 [phone]
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
## 2013-11-11 ENCOUNTER — Encounter: Payer: Self-pay | Admitting: Gastroenterology

## 2013-11-13 ENCOUNTER — Encounter: Payer: 59 | Admitting: Internal Medicine

## 2013-12-02 ENCOUNTER — Ambulatory Visit: Payer: 59 | Admitting: Internal Medicine

## 2014-10-05 ENCOUNTER — Other Ambulatory Visit: Payer: Self-pay | Admitting: Physician Assistant

## 2015-11-09 ENCOUNTER — Encounter: Payer: Self-pay | Admitting: Internal Medicine

## 2016-04-06 ENCOUNTER — Other Ambulatory Visit: Payer: Self-pay | Admitting: Obstetrics and Gynecology

## 2016-04-07 LAB — CYTOLOGY - PAP

## 2018-09-10 ENCOUNTER — Emergency Department (HOSPITAL_COMMUNITY)
Admission: EM | Admit: 2018-09-10 | Discharge: 2018-09-10 | Disposition: A | Discharge status: Home / Self Care | Payer: Managed Care, Other (non HMO) | Attending: Emergency Medicine | Admitting: Emergency Medicine

## 2018-09-10 ENCOUNTER — Emergency Department (HOSPITAL_COMMUNITY): Payer: Managed Care, Other (non HMO)

## 2018-09-10 ENCOUNTER — Encounter (HOSPITAL_COMMUNITY): Payer: Self-pay | Admitting: Emergency Medicine

## 2018-09-10 ENCOUNTER — Other Ambulatory Visit: Payer: Self-pay

## 2018-09-10 DIAGNOSIS — R1084 Generalized abdominal pain: Secondary | ICD-10-CM | POA: Diagnosis not present

## 2018-09-10 DIAGNOSIS — Z79899 Other long term (current) drug therapy: Secondary | ICD-10-CM | POA: Insufficient documentation

## 2018-09-10 DIAGNOSIS — Z87891 Personal history of nicotine dependence: Secondary | ICD-10-CM | POA: Insufficient documentation

## 2018-09-10 DIAGNOSIS — R1110 Vomiting, unspecified: Secondary | ICD-10-CM | POA: Insufficient documentation

## 2018-09-10 DIAGNOSIS — R112 Nausea with vomiting, unspecified: Secondary | ICD-10-CM

## 2018-09-10 DIAGNOSIS — K50819 Crohn's disease of both small and large intestine with unspecified complications: Secondary | ICD-10-CM

## 2018-09-10 DIAGNOSIS — K50112 Crohn's disease of large intestine with intestinal obstruction: Secondary | ICD-10-CM

## 2018-09-10 LAB — URINALYSIS, ROUTINE W REFLEX MICROSCOPIC
Bilirubin Urine: NEGATIVE
Glucose, UA: NEGATIVE mg/dL
Ketones, ur: 20 mg/dL — AB
Leukocytes,Ua: NEGATIVE
Nitrite: NEGATIVE
Protein, ur: NEGATIVE mg/dL
Specific Gravity, Urine: 1.027 (ref 1.005–1.030)
pH: 5 (ref 5.0–8.0)

## 2018-09-10 LAB — CBC WITH DIFFERENTIAL/PLATELET
Abs Immature Granulocytes: 0.01 10*3/uL (ref 0.00–0.07)
Basophils Absolute: 0 10*3/uL (ref 0.0–0.1)
Basophils Relative: 0 %
Eosinophils Absolute: 0 10*3/uL (ref 0.0–0.5)
Eosinophils Relative: 0 %
HCT: 38.6 % (ref 36.0–46.0)
Hemoglobin: 11.9 g/dL — ABNORMAL LOW (ref 12.0–15.0)
Immature Granulocytes: 0 %
Lymphocytes Relative: 15 %
Lymphs Abs: 1 10*3/uL (ref 0.7–4.0)
MCH: 24.6 pg — ABNORMAL LOW (ref 26.0–34.0)
MCHC: 30.8 g/dL (ref 30.0–36.0)
MCV: 79.9 fL — ABNORMAL LOW (ref 80.0–100.0)
Monocytes Absolute: 1.1 10*3/uL — ABNORMAL HIGH (ref 0.1–1.0)
Monocytes Relative: 17 %
Neutro Abs: 4.5 10*3/uL (ref 1.7–7.7)
Neutrophils Relative %: 68 %
Platelets: 368 10*3/uL (ref 150–400)
RBC: 4.83 MIL/uL (ref 3.87–5.11)
RDW: 17.5 % — ABNORMAL HIGH (ref 11.5–15.5)
WBC: 6.6 10*3/uL (ref 4.0–10.5)
nRBC: 0 % (ref 0.0–0.2)

## 2018-09-10 LAB — COMPREHENSIVE METABOLIC PANEL
ALT: 11 U/L (ref 0–44)
AST: 13 U/L — ABNORMAL LOW (ref 15–41)
Albumin: 3.4 g/dL — ABNORMAL LOW (ref 3.5–5.0)
Alkaline Phosphatase: 101 U/L (ref 38–126)
Anion gap: 13 (ref 5–15)
BUN: 12 mg/dL (ref 6–20)
CO2: 23 mmol/L (ref 22–32)
Calcium: 8.6 mg/dL — ABNORMAL LOW (ref 8.9–10.3)
Chloride: 100 mmol/L (ref 98–111)
Creatinine, Ser: 0.91 mg/dL (ref 0.44–1.00)
GFR calc Af Amer: >60 mL/min (ref >60)
GFR calc non Af Amer: >60 mL/min (ref >60)
Glucose, Bld: 111 mg/dL — ABNORMAL HIGH (ref 70–99)
Potassium: 3.4 mmol/L — ABNORMAL LOW (ref 3.5–5.1)
Sodium: 136 mmol/L (ref 135–145)
Total Bilirubin: 0.8 mg/dL (ref 0.3–1.2)
Total Protein: 7 g/dL (ref 6.5–8.1)

## 2018-09-10 LAB — I-STAT BETA HCG BLOOD, ED (MC, WL, AP ONLY): I-stat hCG, quantitative: <5 m[IU]/mL (ref <5)

## 2018-09-10 LAB — POC OCCULT BLOOD, ED: Fecal Occult Bld: POSITIVE — AB

## 2018-09-10 LAB — LIPASE, BLOOD: Lipase: 24 U/L (ref 11–51)

## 2018-09-10 MED ORDER — MORPHINE SULFATE (PF) 4 MG/ML IV SOLN
4.0000 mg | Freq: Once | INTRAVENOUS | Status: AC
  Administered 2018-09-10: 4 mg via INTRAVENOUS
  Filled 2018-09-10: qty 1

## 2018-09-10 MED ORDER — ONDANSETRON HCL 4 MG/2ML IJ SOLN
4.0000 mg | Freq: Once | INTRAMUSCULAR | Status: AC
  Administered 2018-09-10: 4 mg via INTRAVENOUS
  Filled 2018-09-10: qty 2

## 2018-09-10 MED ORDER — SODIUM CHLORIDE 0.9 % IV BOLUS
1000.0000 mL | Freq: Once | INTRAVENOUS | Status: AC
  Administered 2018-09-10: 1000 mL via INTRAVENOUS

## 2018-09-10 MED ORDER — PREDNISONE 5 MG PO TABS: ORAL_TABLET | ORAL | 0 refills | Status: DC

## 2018-09-10 MED ORDER — ONDANSETRON 4 MG PO TBDP: 4.0000 mg | ORAL_TABLET | Freq: Three times a day (TID) | ORAL | 0 refills | Status: DC | PRN

## 2018-09-10 MED ORDER — IOPAMIDOL (ISOVUE-300) INJECTION 61%
100.0000 mL | Freq: Once | INTRAVENOUS | Status: AC | PRN
  Administered 2018-09-10: 100 mL via INTRAVENOUS

## 2018-09-10 MED ORDER — PANTOPRAZOLE SODIUM 40 MG IV SOLR
40.0000 mg | Freq: Once | INTRAVENOUS | Status: AC
  Administered 2018-09-10: 40 mg via INTRAVENOUS
  Filled 2018-09-10: qty 40

## 2018-09-10 NOTE — ED Notes (Signed)
Urine culture collected and sent to main lab.

## 2018-09-10 NOTE — Discharge Instructions (Addendum)
Schedule to see Gi for evaluation as directed.

## 2018-09-10 NOTE — ED Triage Notes (Signed)
Pt arrives from home. Pt complaining of inability to eat since Sunday. Pt states Sunday she began having abdominal pain/cramping. Pt states that since that began she has not been able to keep any food down.

## 2018-09-10 NOTE — ED Notes (Signed)
Patient transported to CT 

## 2018-09-10 NOTE — ED Provider Notes (Signed)
Pt's care assumed by me at 4pm.  I spoke to Nancy Marus with [facility].  She will see pt here for medication management.    Fransico Meadow, [place] 09/10/18 1613    Pattricia Boss, MD 09/11/18 1248

## 2018-09-10 NOTE — Consult Note (Addendum)
[facility] Consult: 4:03 PM 09/10/2018  LOS: 0 days    Referring Provider: Dr. Rogene Houston in the ED. Primary Care Physician:  Patient, [name] Dr. Ouida Sills is her gynecologist Primary Gastroenterologist:  Dr Carlean Purl    Reason for Consultation: Crohn's flare.   HPI: [name] is a 43 y.o. female.  Hx IDA.  Uterine fibroids with menorrhagia. Crohns colitis dx 2006.  Severe colitis from sigmoid to cecum on colonoscopy 2011 Had been taking only mesalamine as of 08/2013, her last GI OV.  Colonoscopy 2015: severe colitis from 20 to 60 mm.  Normal TI.   Patient previously took Noble but has not taken this in years.  GI wise she has felt fine.  She says she is "in remission", her words/her diagnosis.  3 days ago, on Sunday she lost her appetite and did not feel well.  Monday, Tuesday nauseous and vomiting a couple of times.  She has mid abdominal pain which is moderate level.  Normally her stools are twice a day, formed and kind of dark in color because she takes iron 3 times a day.  For the last couple of days they have been looser but only 1 or 2/day.  No fever, no visible blood in the stool, no coffee ground emesis. For a couple of days in early to mid April she had similar but much milder symptoms than she has currently  Presented to the ED for evaluation.   CTAP: extensive Crohns of mid-distal SB and colon.  / ileus in transverse colon.  Uterine fibroids.  Hgb 11.9, low MCV.  WBC 6.6   FOBT + K 3.4.  Albumin 3.4.    Works full-time as a Orthoptist for [employer].  Does not smoke.  Past Medical History:  Diagnosis Date   Crohn's disease ([place])    Diarrhea    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 10/17/2010   Rectal bleeding    Uterine fibroid    Vitamin D  deficiency 09/24/2013    History reviewed. No pertinent surgical history.  Prior to Admission medications   Medication Sig Start Date End Date Taking? Authorizing Provider  ferrous sulfate 325 (65 FE) MG tablet Take 325 mg by mouth 3 (three) times daily with meals.   Yes [provider]  HYDROcodone-acetaminophen (NORCO/VICODIN) 5-325 MG per tablet Take 1 tablet by mouth every 4 (four) hours as needed for moderate pain (toothpain). Patient not taking: Reported on 09/10/2018 11/10/13   Antonietta Breach, PA-C  LIALDA 1.2 G EC tablet TAKE 2 TABLETS BY MOUTH TWICE DAILY Patient not taking: Reported on 09/10/2018 10/05/14   [name], MD  methocarbamol (ROBAXIN) 500 MG tablet Take 1 tablet (500 mg total) by mouth 2 (two) times daily. Patient not taking: Reported on 09/10/2018 11/10/13   Antonietta Breach, PA-C  naproxen (NAPROSYN) 500 MG tablet Take 1 tablet (500 mg total) by mouth 2 (two) times daily. Patient not taking: Reported on 09/10/2018 11/10/13   Antonietta Breach, PA-C    Scheduled Meds:  Infusions:  PRN Meds:    Allergies as of 09/10/2018   (No Known Allergies)    Family History  Problem Relation Age of Onset   Breast cancer Maternal Aunt    Diabetes Father    Diabetes Paternal Aunt    Colon cancer Neg Hx     Social History   Socioeconomic History   Marital status: Single    Spouse name: Not on file   Number of children: 1   Years of education: Not on file   Highest education level: Not on file  Occupational History   Occupation: Careers information officer   Social Needs   Financial resource strain: Not on file   Food insecurity:    Worry: Not on file    Inability: Not on file   Transportation needs:    Medical: Not on file    Non-medical: Not on file  Tobacco Use   Smoking status: Former Smoker    Types: Cigarettes   Smokeless tobacco: Never Used  Substance and Sexual Activity   Alcohol use: Yes    Alcohol/week: 3.0 standard drinks    Types: 1 Glasses of  wine, 1 Cans of beer, 1 Shots of liquor per week   Drug use: No   Sexual activity: Never    Birth control/protection: None  Lifestyle   Physical activity:    Days per week: Not on file    Minutes per session: Not on file   Stress: Not on file  Relationships   Social connections:    Talks on phone: Not on file    Gets together: Not on file    Attends religious service: Not on file    Active member of club or organization: Not on file    Attends meetings of clubs or organizations: Not on file    Relationship status: Not on file   Intimate partner violence:    Fear of current or ex partner: Not on file    Emotionally abused: Not on file    Physically abused: Not on file    Forced sexual activity: Not on file  Other Topics Concern   Not on file  Social History Narrative   Not on file    REVIEW OF SYSTEMS: Constitutional: Generally has good energy.  She still working full-time as a Careers information officer ENT:  No nose bleeds Pulm: No difficulty breathing, no cough. CV:  No palpitations, no LE edema.  No chest pain. GU:  No hematuria, no frequency.  Slight decrease in urinary output in the last couple of days GYN: Menorrhagia.  Occasional midcycle bleeding. GI: See HPI.  No dysphagia.  Generally no reflux symptoms. Heme: No unusual bleeding or bruising Transfusions: None. Neuro:  No headaches, no peripheral tingling or numbness Derm:  No itching, no rash or sores.  Endocrine:  No sweats or chills.  No polyuria or dysuria Immunization: Reviewed. Travel:  None beyond local counties in last few months.    PHYSICAL EXAM: Vital signs in last 24 hours: Vitals:   09/10/18 1430 09/10/18 1515  BP: 124/87 116/88  Pulse: 86 93  Resp: 16   Temp:    SpO2: 98% 99%   Wt Readings from Last 3 Encounters:  09/10/18 67.1 kg  09/17/13 68.5 kg  08/22/13 68.5 kg    General: Pleasant, looks well.  Not toxic.  Not uncomfortable. Head: No facial asymmetry or swelling.  No signs of head  trauma. Eyes: No scleral icterus.  No conjunctival pallor. Ears: Not hard of hearing Nose:  No discharge or congestion Mouth: Good dentition.  Oral mucosa moist, pink, clear. Neck: No JVD, no masses.  No thyromegaly. Lungs: Clear bilaterally.  No labored breathing, no cough. Heart: RRR.  No MRG.  S1, S2 present. Abdomen: Soft.  Bowel sounds hypoactive but not tinkling or tympanitic.  Not distended.  Tenderness without guarding or rebound broadly in the mid abdomen..   Rectal: Deferred Musc/Skeltl: No joint redness, swelling, deformity. Extremities: No CCE. Neurologic: Oriented x3.  Moves all 4 limbs.  No limb weakness, no tremors.  No gross deficits. Skin: No rashes, no sores Tattoos: Yes  Nodes: No cervical adenopathy Psych: In good spirits, pleasant, cooperative  Intake/Output from previous day: No intake/output data recorded. Intake/Output this shift: Total I/O In: 1000 [IV Piggyback:1000] Out: -   LAB RESULTS: Recent Labs    09/10/18 1029  WBC 6.6  HGB 11.9*  HCT 38.6  PLT 368   BMET Lab Results  Component Value Date   NA 136 09/10/2018   NA 136 08/22/2013   NA 138 10/17/2010   K 3.4 (L) 09/10/2018   K 3.6 08/22/2013   K 4.0 10/17/2010   CL 100 09/10/2018   CL 103 08/22/2013   CL 103 10/17/2010   CO2 23 09/10/2018   CO2 27 08/22/2013   CO2 25 10/17/2010   GLUCOSE 111 (H) 09/10/2018   GLUCOSE 91 08/22/2013   GLUCOSE 88 10/17/2010   BUN 12 09/10/2018   BUN 9 08/22/2013   BUN 11 10/17/2010   CREATININE 0.91 09/10/2018   CREATININE 0.8 08/22/2013   CREATININE 0.7 10/17/2010   CALCIUM 8.6 (L) 09/10/2018   CALCIUM 8.5 08/22/2013   CALCIUM 8.7 10/17/2010   LFT Recent Labs    09/10/18 1029  PROT 7.0  ALBUMIN 3.4*  AST 13*  ALT 11  ALKPHOS 101  BILITOT 0.8   PT/INR No results found for: INR, PROTIME Hepatitis Panel No results for input(s): HEPBSAG, HCVAB, HEPAIGM, HEPBIGM in the last 72 hours. C-Diff No components found for: CDIFF Lipase       Component Value Date/Time   LIPASE 24 09/10/2018 1029    Drugs of Abuse  No results found for: LABOPIA, COCAINSCRNUR, LABBENZ, AMPHETMU, THCU, LABBARB   RADIOLOGY STUDIES: Ct Abdomen Pelvis W Contrast  Result Date: 09/10/2018 CLINICAL DATA:  Abdominal pain for 2 days. History of Crohn's disease EXAM: CT ABDOMEN AND PELVIS WITH CONTRAST TECHNIQUE: Multidetector CT imaging of the abdomen and pelvis was performed using the standard protocol following bolus administration of intravenous contrast. CONTRAST:  119m ISOVUE-300 IOPAMIDOL (ISOVUE-300) INJECTION 61% COMPARISON:  None. FINDINGS: Lower chest: Lung bases are clear. Hepatobiliary: No focal liver lesions are appreciable. Gallbladder wall is not appreciably thickened. There is no biliary duct dilatation. Pancreas: No pancreatic mass or inflammatory focus. Spleen: No splenic lesions are evident. Adrenals/Urinary Tract: Adrenals bilaterally appear normal. Kidneys bilaterally show no evident mass or hydronephrosis on either side. There is no evident renal or ureteral calculus on either side. Urinary bladder is midline with wall thickness within normal limits. Stomach/Bowel: The proximal small bowel and stomach appear within normal limits. There is dilatation with enhancement of the walls of the distal jejunum and essentially the entire ileum. There is wall enhancement and dilatation of the ascending colon to the level of the proximal transverse colon. There Is fluid arising in the proximal transverse colon with moderate fluid throughout the remainder of the colon. There is milder wall thickening in portions of the descending and proximal sigmoid colon regions. There  is no bowel obstruction. No evident free air or portal venous air. Note that there is fluid tracking along the inferior aspect of the cecum. Vascular/Lymphatic: There is no abdominal aortic aneurysm. No vascular lesions are evident. No adenopathy is appreciable in the abdomen or pelvis.  Reproductive: The uterus is anteverted. The uterus is diffusely inhomogeneous and contains multiple masses consistent with diffuse leiomyomatous change. A leiomyoma in the leftward posterior aspect of the uterus has peripheral calcification. This leiomyoma measures 4.4 x 4.2 cm. No extrauterine pelvic masses are evident. Other: Appendix appears unremarkable. No abscess is evident in the abdomen or pelvis. There is no ascites beyond fluid tracking adjacent to the cecum in the right pelvis. Musculoskeletal: There are no blastic or lytic bone lesions. No sacroiliitis evident. No intramuscular or abdominal wall lesions are evident. IMPRESSION: 1. Extensive mid to distal small bowel and colonic inflammatory changes consistent with known Crohn's disease. There is wall thickening and enhancement throughout much of the mid the distal small bowel as well as throughout much of the colon. The proximal small bowel and stomach appear unremarkable. No frank bowel obstruction evident. Note that there is localized fluid extending from the proximal transverse colon throughout most of the remainder of the transverse colon, potentially due to a degree of ileus in this area. No fistulae are evident. There is fluid adjacent to the cecum which is probably of post inflammatory etiology. 2. The appendix appears unremarkable. No abscess evident in the abdomen or pelvis. 3.  Enlarged leiomyomatous uterus. 4. No evident renal or ureteral calculus. No hydronephrosis. Urinary bladder appears unremarkable. 5.  No evidence sacroiliitis. Electronically Signed   By: Lowella Grip III M.D.   On: 09/10/2018 15:11     IMPRESSION:   *    Acute small bowel and colonic Crohn's disease in patient with previous history of colonic Crohn's. Interestingly was not having any Crohn's symptoms until the last few days though her CT suggests Crohn's disease has been active for some time.  *    IDA.  Hemoglobins actually pretty good but her MCV is low.   This despite her taking iron 3 times daily.      PLAN:     *   ?  Can she discharge out of the ED on oral prednisone with plans for rapid follow-up with Dr. Arelia Longest at the office or by virtual visit.  She is not toxic at present.  Concern would be that she is not able to tolerate p.o. enough to prevent dehydration.    [name]  09/10/2018, 4:03 PM Phone [phone]

## 2018-09-10 NOTE — ED Notes (Signed)
Pt ambulated to the restroom with steady gait.

## 2018-09-11 ENCOUNTER — Encounter: Payer: Self-pay | Admitting: Internal Medicine

## 2018-09-11 NOTE — ED Provider Notes (Signed)
[facility] EMERGENCY DEPARTMENT Provider Note   CSN: [id number] Arrival date & time: 09/10/18  0160    History   Chief Complaint Chief Complaint  Patient presents with   Abdominal Pain    HPI [name] is a 43 y.o. female with PMH/o Crohn's Disease Who presents for evaluation of 2 days of generalized abdominal pain, more focal in the upper portion of her abdomen, and nausea/vomiting/diarrhea.  She states that over the last 2 days, she has not been able to tolerate much p.o.  She states she has had several episodes of nonbloody, nonbilious emesis.  She also reports several episodes of diarrhea.  She states that she normally has some darker stools because she takes p.o. iron pills.  She states that she has not noticed any changes or black tarry stools.  She has noticed some mild bright red blood but states she is currently on her menstrual cycle she does not know if that is what is causing the blood.  Patient states that she has not tried any medications for her pains.  She denies any alleviating or aggravating factors.  She states symptoms began after eating at a cookout at a family member's house.  She reports that several members of her family ate the same food that she did and they do not have symptoms.  Patient states that she has not noted any fevers, chest pain, difficulty breathing, dysuria, hematuria, vaginal discharge.  She has a history of Crohn's disease and had been previously followed by Dr. Sharlett Iles ([facility]).  She states she has not seen a [place] in several years and states that she is not currently on any medications.     The history is provided by the patient.    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 No pertinent surgical history.   OB History   No obstetric history on file.      Home Medications    Prior to Admission medications   Medication Sig Start Date End Date Taking? Authorizing Provider  ferrous sulfate 325 (65 FE) MG tablet Take 325 mg by mouth 3 (three) times daily with meals.   Yes [provider]  HYDROcodone-acetaminophen (NORCO/VICODIN) 5-325 MG per tablet Take 1 tablet by mouth every 4 (four) hours as needed for moderate pain (toothpain). Patient not taking: Reported on 09/10/2018 11/10/13   Antonietta Breach, PA-C  LIALDA 1.2 G EC tablet TAKE 2 TABLETS BY MOUTH TWICE DAILY Patient not taking: Reported on 09/10/2018 10/05/14   [name], MD  methocarbamol (ROBAXIN) 500 MG tablet Take 1 tablet (500 mg total) by mouth 2 (two) times daily. Patient not taking: Reported on 09/10/2018 11/10/13   Antonietta Breach, PA-C  naproxen (NAPROSYN) 500 MG tablet Take 1 tablet (500 mg total) by mouth 2 (two) times daily. Patient not taking: Reported on 09/10/2018 11/10/13   Antonietta Breach, PA-C  ondansetron [facility] ODT) 4  MG disintegrating tablet Take 1 tablet (4 mg total) by mouth every 8 (eight) hours as needed for nausea or vomiting. 09/10/18   Fransico Meadow, PA-C  predniSONE (DELTASONE) 5 MG tablet 86m a week x 2 weeks then 35 mg x 1 week, 359mx 1 week, 25, x 1 week then 20,15,10 and 5 09/10/18   SoFransico MeadowPA-C    Family History Family History  Problem Relation Age of Onset   Breast cancer Maternal Aunt    Diabetes Father    Diabetes Paternal Aunt    Colon cancer Neg Hx     Social History Social History   Tobacco Use   Smoking status: Former Smoker    Types:  Cigarettes   Smokeless tobacco: Never Used  Substance Use Topics   Alcohol use: Yes    Alcohol/week: 3.0 standard drinks    Types: 1 Glasses of wine, 1 Cans of beer, 1 Shots of liquor per week   Drug use: No     Allergies   Patient has no known allergies.   Review of Systems Review of Systems  Constitutional: Negative for fever.  Respiratory: Negative for cough and shortness of breath.   Cardiovascular: Negative for chest pain.  Gastrointestinal: Positive for abdominal pain, blood in stool, nausea and vomiting.  Genitourinary: Negative for dysuria and hematuria.  Neurological: Negative for headaches.  All other systems reviewed and are negative.    Physical Exam Updated Vital Signs BP 128/86 (BP Location: Right Arm)    Pulse 90    Temp 98.2 F (36.8 C) (Oral)    Resp 14    Ht 5' 4"  (1.626 m)    Wt 67.1 kg    SpO2 98%    BMI 25.40 kg/m   Physical Exam Vitals signs and nursing note reviewed. Exam conducted with a chaperone present.  Constitutional:      Appearance: Normal appearance. She is well-developed.  HENT:     Head: Normocephalic and atraumatic.  Eyes:     General: Lids are normal.     Conjunctiva/sclera: Conjunctivae normal.     Pupils: Pupils are equal, round, and reactive to light.  Neck:     Musculoskeletal: Full passive range of motion without pain.  Cardiovascular:     Rate and Rhythm: Normal rate and regular rhythm.     Pulses: Normal pulses.     Heart sounds: Normal heart sounds. No murmur. No friction rub. No gallop.   Pulmonary:     Effort: Pulmonary effort is normal.     Breath sounds: Normal breath sounds.     Comments: Lungs clear to auscultation bilaterally.  Symmetric chest rise.  No wheezing, rales, rhonchi. Abdominal:     Palpations: Abdomen is soft. Abdomen is not rigid.     Tenderness: There is generalized abdominal tenderness. There is no right CVA tenderness, left CVA tenderness or guarding.     Comments: Abdomen is soft,  nondistended.  Generalized tenderness noted throughout, more focal at the upper abdomen.  No rigidity, guarding.  Genitourinary:    Rectum: Guaiac result positive.     Comments: The exam was performed with a chaperone present.  No gross melena on DRE.  There was bright red blood surrounding the vagina that extended over into the anus.  I suspect that this is from being on her menstrual cycle.  There was some bright red blood around the anus and on the finger during initial exam but no evidence of black or tarry  stools. Musculoskeletal: Normal range of motion.  Skin:    General: Skin is warm and dry.     Capillary Refill: Capillary refill takes less than 2 seconds.  Neurological:     Mental Status: She is alert and oriented to person, place, and time.  Psychiatric:        Speech: Speech normal.      ED Treatments / Results  Labs (all labs ordered are listed, but only abnormal results are displayed) Labs Reviewed  COMPREHENSIVE METABOLIC PANEL - Abnormal; Notable for the following components:      Result Value   Potassium 3.4 (*)    Glucose, Bld 111 (*)    Calcium 8.6 (*)    Albumin 3.4 (*)    AST 13 (*)    All other components within normal limits  CBC WITH DIFFERENTIAL/PLATELET - Abnormal; Notable for the following components:   Hemoglobin 11.9 (*)    MCV 79.9 (*)    MCH 24.6 (*)    RDW 17.5 (*)    Monocytes Absolute 1.1 (*)    All other components within normal limits  URINALYSIS, ROUTINE W REFLEX MICROSCOPIC - Abnormal; Notable for the following components:   APPearance HAZY (*)    Hgb urine dipstick LARGE (*)    Ketones, ur 20 (*)    Bacteria, UA FEW (*)    All other components within normal limits  POC OCCULT BLOOD, ED - Abnormal; Notable for the following components:   Fecal Occult Bld POSITIVE (*)    All other components within normal limits  LIPASE, BLOOD  I-STAT BETA HCG BLOOD, ED (MC, WL, AP ONLY)    EKG None  Radiology Ct Abdomen Pelvis W  Contrast  Result Date: 09/10/2018 CLINICAL DATA:  Abdominal pain for 2 days. History of Crohn's disease EXAM: CT ABDOMEN AND PELVIS WITH CONTRAST TECHNIQUE: Multidetector CT imaging of the abdomen and pelvis was performed using the standard protocol following bolus administration of intravenous contrast. CONTRAST:  175m ISOVUE-300 IOPAMIDOL (ISOVUE-300) INJECTION 61% COMPARISON:  None. FINDINGS: Lower chest: Lung bases are clear. Hepatobiliary: No focal liver lesions are appreciable. Gallbladder wall is not appreciably thickened. There is no biliary duct dilatation. Pancreas: No pancreatic mass or inflammatory focus. Spleen: No splenic lesions are evident. Adrenals/Urinary Tract: Adrenals bilaterally appear normal. Kidneys bilaterally show no evident mass or hydronephrosis on either side. There is no evident renal or ureteral calculus on either side. Urinary bladder is midline with wall thickness within normal limits. Stomach/Bowel: The proximal small bowel and stomach appear within normal limits. There is dilatation with enhancement of the walls of the distal jejunum and essentially the entire ileum. There is wall enhancement and dilatation of the ascending colon to the level of the proximal transverse colon. There Is fluid arising in the proximal transverse colon with moderate fluid throughout the remainder of the colon. There is milder wall thickening in portions of the descending and proximal sigmoid colon regions. There is no bowel obstruction. No evident free air or portal venous air. Note that there is fluid tracking along the inferior aspect of the cecum. Vascular/Lymphatic: There is no abdominal aortic aneurysm. No vascular lesions are evident. No adenopathy is appreciable in the abdomen or pelvis. Reproductive: The uterus is anteverted. The uterus is diffusely inhomogeneous and contains multiple masses consistent with diffuse leiomyomatous change. A leiomyoma in the leftward posterior aspect of the  uterus has peripheral calcification. This leiomyoma measures 4.4 x 4.2 cm. No extrauterine pelvic masses are evident. Other: Appendix  appears unremarkable. No abscess is evident in the abdomen or pelvis. There is no ascites beyond fluid tracking adjacent to the cecum in the right pelvis. Musculoskeletal: There are no blastic or lytic bone lesions. No sacroiliitis evident. No intramuscular or abdominal wall lesions are evident. IMPRESSION: 1. Extensive mid to distal small bowel and colonic inflammatory changes consistent with known Crohn's disease. There is wall thickening and enhancement throughout much of the mid the distal small bowel as well as throughout much of the colon. The proximal small bowel and stomach appear unremarkable. No frank bowel obstruction evident. Note that there is localized fluid extending from the proximal transverse colon throughout most of the remainder of the transverse colon, potentially due to a degree of ileus in this area. No fistulae are evident. There is fluid adjacent to the cecum which is probably of post inflammatory etiology. 2. The appendix appears unremarkable. No abscess evident in the abdomen or pelvis. 3.  Enlarged leiomyomatous uterus. 4. No evident renal or ureteral calculus. No hydronephrosis. Urinary bladder appears unremarkable. 5.  No evidence sacroiliitis. Electronically Signed   By: Lowella Grip III M.D.   On: 09/10/2018 15:11    Procedures Procedures (including critical care time)  Medications Ordered in ED Medications  sodium chloride 0.9 % bolus 1,000 mL (0 mLs Intravenous Stopped 09/10/18 1153)  morphine 4 MG/ML injection 4 mg (4 mg Intravenous Given 09/10/18 1047)  ondansetron (ZOFRAN) injection 4 mg (4 mg Intravenous Given 09/10/18 1046)  morphine 4 MG/ML injection 4 mg (4 mg Intravenous Given 09/10/18 1507)  ondansetron (ZOFRAN) injection 4 mg (4 mg Intravenous Given 09/10/18 1505)  pantoprazole (PROTONIX) injection 40 mg (40 mg Intravenous Given  09/10/18 1507)  iopamidol (ISOVUE-300) 61 % injection 100 mL (100 mLs Intravenous Contrast Given 09/10/18 1455)     Initial Impression / Assessment and Plan / ED Course  I have reviewed the triage vital signs and the nursing notes.  Pertinent labs & imaging results that were available during my care of the patient were reviewed by me and considered in my medical decision making (see chart for details).        43 year old female history of Crohn's not currently on any medications who presents for 2 days of generalized abdominal pain, nausea/vomiting/diarrhea.  No fevers, urinary complaints.  On initial ED arrival, she is afebrile, vitals are stable.  On exam, she has diffuse tenderness, more focal at the upper abdomen.  Concern for infectious etiology versus Crohn's flare.  History/physical exam not concerning for pelvic pathology.  Additionally, she has noticed some bright red blood in her stools but she is on her menstrual cycle.  She does not have any risk factors for GI bleed.  Doubt that this is the cause of her symptoms but will plan to evaluate.  GU exam as documented above.  Patient had some bright red blood that I think is secondary from her menstrual bleeding.  No gross melena on exam.  UA negative for any infectious etiology.  I-STAT beta is negative.  Fecal occult is positive.  Lipase unremarkable.  CMP shows LFTs within normal limits.  Normal BUN and creatinine.  CBC without any significant leukocytosis.  Hemoglobin stable at 11.9.  CT abdomen pelvis shows extensive mid to distal small bowel and colonic inflammatory changes consistent with known Crohn's disease.  She has diffuse wall thickening and enhancement through much of the mid distal small bowel as well as throughout much of the colon.  No evidence of bowel obstruction.  Given extensiveness of inflammation likely secondary to Crohn's flare, will plan to consult GI.  Patient signed out to Marcene Brawn, PA-C with GI evaluation  pending.  Portions of this note were generated with [employer]. Dictation errors may occur despite best attempts at proofreading.      Final Clinical Impressions(s) / ED Diagnoses   Final diagnoses:  Generalized abdominal pain  Non-intractable vomiting with nausea, unspecified vomiting type    ED Discharge Orders         Ordered    predniSONE (DELTASONE) 5 MG tablet    Note to Pharmacy:  Pharmacist please assist with taper dosage   09/10/18 1754    ondansetron (ZOFRAN ODT) 4 MG disintegrating tablet  Every 8 hours PRN     09/10/18 1754           Volanda Napoleon, PA-C 09/11/18 1304    Fredia Sorrow, MD 09/11/18 1930

## 2018-09-24 ENCOUNTER — Telehealth: Payer: Self-pay

## 2018-09-24 NOTE — Telephone Encounter (Signed)
Covid-19 screening questions   Do you now or have you had a fever in the last 14 days? NO  Do you have any respiratory symptoms of shortness of breath or cough now or in the last 14 days? NO  Do you have any family members or close contacts with diagnosed or suspected Covid-19 in the past 14 days? NO  Have you been tested for Covid-19 and found to be positive? NO  Patient called back and answered questions

## 2018-09-24 NOTE — Telephone Encounter (Signed)
Call patient and left voicemail to give [place] a call back in regard to covid screening

## 2018-09-26 ENCOUNTER — Ambulatory Visit (INDEPENDENT_AMBULATORY_CARE_PROVIDER_SITE_OTHER): Payer: Managed Care, Other (non HMO) | Admitting: Internal Medicine

## 2018-09-26 ENCOUNTER — Encounter: Payer: Self-pay | Admitting: Internal Medicine

## 2018-09-26 DIAGNOSIS — K50819 Crohn's disease of both small and large intestine with unspecified complications: Secondary | ICD-10-CM | POA: Diagnosis not present

## 2018-09-26 DIAGNOSIS — D500 Iron deficiency anemia secondary to blood loss (chronic): Secondary | ICD-10-CM | POA: Diagnosis not present

## 2018-09-26 DIAGNOSIS — E559 Vitamin D deficiency, unspecified: Secondary | ICD-10-CM

## 2018-09-26 MED ORDER — MESALAMINE 1.2 G PO TBEC: 2.4000 g | DELAYED_RELEASE_TABLET | Freq: Two times a day (BID) | ORAL | 1 refills | Status: DC

## 2018-09-26 NOTE — Assessment & Plan Note (Addendum)
Lialda 2.4 g twice daily will be started. Finish steroids Colonoscopy after she has been on the Lialda for a couple of months.  We will place and October colonoscopy recall to get it done by around that time.  I think it makes sense to have her perform a colonoscopy after she been on therapy for a few months.  The last one was done after she had stopped therapy once before.  We had a discussion today about how Crohn's is a chronic illness and autoimmune disease and requires chronic therapy.  Even if feeling well there are potential problems including an increased risk of cancer if inflammation is untreated.  I will say that this recent suspected Crohn's flare may not have been 1 given the short-term signs and symptoms and that rapid resolution.  I wonder if she did not have some sort of a gastrointestinal infection.  She could certainly could have had a gastroenteritis responsible for the small bowel changes seen on CT.  She is never had small bowel issues before.  Consider repeat cross-sectional imaging though I will try to look into the terminal ileum when she has a Colonoscopy.  I did recommend that she practice birth control on a regular basis as she indicates she is not intending to get pregnant and that pregnancy in the face of IBD can be complicated as well.

## 2018-09-26 NOTE — Progress Notes (Signed)
[name] 43 y.o. 1975/10/24 [id number]  Assessment & Plan:   Crohn's disease of small and large intestines with complication (H[facility]) Lialda 2.4 g twice daily will be started. Finish steroids Colonoscopy after she has been on the Lialda for a couple of months.  We will place and October colonoscopy recall to get it done by around that time.  I think it makes sense to have her perform a colonoscopy after she been on therapy for a few months.  The last one was done after she had stopped therapy once before.  We had a discussion today about how Crohn's is a chronic illness and autoimmune disease and requires chronic therapy.  Even if feeling well there are potential problems including an increased risk of cancer if inflammation is untreated.  I will say that this recent suspected Crohn's flare may not have been 1 given the short-term signs and symptoms and that rapid resolution.  I wonder if she did not have some sort of a gastrointestinal infection.  She could certainly could have had a gastroenteritis responsible for the small bowel changes seen on CT.  She is never had small bowel issues before.  Consider repeat cross-sectional imaging though I will try to look into the terminal ileum when she has a Colonoscopy.  I did recommend that she practice birth control on a regular basis as she indicates she is not intending to get pregnant and that pregnancy in the face of IBD can be complicated as well.  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 Ferritin, B12 level  Vitamin D deficiency recheck   Printed lab orders provided to the patient she works for [employer] and may get her labs free   I will send a copy to Dr. Freda Munro Subjective:   Chief Complaint: Reestablish care for Crohn's disease  HPI The patient is a 43 year old African-American woman with a history of Crohn's colitis, originally diagnosed in 2008 by Dr. Verl Blalock and I  assumed her care in 48 14-15 after his retirement.  She has not been seen since then until she showed up in the ER in early June where [name] and Nancy Marus saw her because she had abdominal cramps and diarrhea.  CT scanning demonstrated extensive mid to distal small bowel colonic inflammatory changes consistent with known Crohn's disease.  Wall thickening and enhancement throughout much of the mid and distal small bowel as well as much of the colon.  Localized fluid from the proximal transverse colon suggestion of ileus 2.  She was started on a prednisone taper and actually got better the next day.  She had really only been sick for about 24 to 36 hours total.  In general since stopping Lialda in 2015 she has felt well.  No diarrhea abdominal pain bleeding etc.  At her last colonoscopy in June 2015 she had pretty severe left-sided colitis and otherwise normal exam.  She also has a history of vitamin D deficiency and iron deficiency anemia.  Review of systems notable for irregular menses.  Pregnancy test was negative in early June.  She is sexually active with her boyfriend and intermittently uses protection.  She is followed by Dr. Freda Munro of gynecology for the fibroids.  She is considering options to treat those but has not decided. No Known Allergies Current Meds  Medication Sig  . ferrous sulfate 325 (65 FE) MG tablet Take 325 mg by mouth 3 (three) times daily with meals.  Marland Kitchen  ondansetron (ZOFRAN ODT) 4 MG disintegrating tablet Take 1 tablet (4 mg total) by mouth every 8 (eight) hours as needed for nausea or vomiting.  . predniSONE (DELTASONE) 5 MG tablet 69m a week x 2 weeks then 35 mg x 1 week, 387mx 1 week, 25, x 1 week then 20,15,10 and 5   Past Medical History:  Diagnosis Date  . [place]  . Diarrhea   . 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 Rectal bleeding   . Uterine fibroid   . Vitamin D deficiency  09/24/2013   Past Surgical History:  Procedure Laterality Date  . COLONOSCOPY     Social History   Social History Narrative   Single, 1 daughter born 2001 approximately   [employer] [place] She is a former smoker, occasional alcohol no drug use   family history includes Breast cancer in her maternal aunt; Diabetes in her father and paternal aunt.   Review of Systems As per HPI  Objective:   Physical Exam BP 138/84 (BP Location: Right Arm, Patient Position: Sitting, Cuff Size: Normal)   Pulse 98   Temp 98.4 F (36.9 C) (Oral)   Resp 16   Ht 5' 4"  (1.626 m)   Wt 148 lb (67.1 kg)   SpO2 99%   BMI 25.40 kg/m  Middle-aged black woman looking stated age or younger in no acute distress Eyes anicteric Lungs clear Abdomen is soft slightly tympanitic and protuberant but without organomegaly mass or tenderness The skin is notable for some tattoos otherwise warm and dry She is alert and oriented x3 and has an appropriate mood and affect

## 2018-09-26 NOTE — Patient Instructions (Addendum)
  If you are age 41 or younger, your body mass index should be between 19-25. Your Body mass index is 25.4 kg/m. If this is out of the aformentioned range listed, please consider follow up with your Primary Care Provider.   To help prevent the possible spread of infection to our patients, communities, and staff; we will be implementing the following measures:  As of now we are not allowing any visitors/family members to accompany you to any upcoming appointments with [name]. If you have any concerns about this please contact our office to discuss prior to the appointment.   We are giving you printed lab orders to take to [facility] to have drawn.   We have sent the following medications to your pharmacy for you to pick up at your convenience: [name]    We are putting you in for a colon recall for October 2020.   I appreciate the opportunity to care for you. [name], MD, [facility]

## 2018-09-26 NOTE — Assessment & Plan Note (Signed)
recheck

## 2018-09-26 NOTE — Assessment & Plan Note (Signed)
Ferritin, B12 level

## 2019-01-20 ENCOUNTER — Telehealth: Payer: Self-pay

## 2019-01-20 ENCOUNTER — Encounter: Payer: Self-pay | Admitting: Internal Medicine

## 2019-01-20 NOTE — Telephone Encounter (Signed)
-----   Message from [name], MD sent at 01/19/2019  3:49 PM EDT ----- Regarding: needs colonoscopy Please contact her and set up a colonoscopy  for Crohn's  See my last note -- plan was to do a colonoscopy after she had been on Lialda for several mos

## 2019-01-20 NOTE — Telephone Encounter (Signed)
Unable to reach the patient.  Recall letter mailed.

## 2019-02-03 ENCOUNTER — Telehealth: Payer: Self-pay | Admitting: Internal Medicine

## 2019-02-03 NOTE — Telephone Encounter (Signed)
I called [place] and she states starting today she is having nausea, vomiting, diarrhea, no blood, unsure of her temp. She drank 2 glasses of wine Saturday and wonders if this set off a flare. She has not been on her lialda  In months. She has #40 prednisone 69m on hand, she took 3 today. She is requesting prednisone rx. She was seen in June 2020. Please see office note.

## 2019-02-03 NOTE — Telephone Encounter (Signed)
She was supposed to start Lialda - she needs to do that and start prednisone 10 mg w each day # 120  In 1 week go to 30 mg daily  Needs OV me or an APP in 2-3 weeks- can use an empty banding slot if I have one

## 2019-02-04 ENCOUNTER — Telehealth: Payer: Self-pay | Admitting: Internal Medicine

## 2019-02-04 MED ORDER — PREDNISONE 10 MG PO TABS: ORAL_TABLET | ORAL | 0 refills | Status: DC

## 2019-02-04 NOTE — Telephone Encounter (Signed)
Spoke with [name] and she will restart her Lialda and do the prednisone. She has made an appointment for November 17th with Dr Carlean Purl.

## 2019-02-04 NOTE — Telephone Encounter (Signed)
I have spoken to patient , see previous phone note.

## 2019-02-25 ENCOUNTER — Ambulatory Visit: Payer: Managed Care, Other (non HMO) | Admitting: Internal Medicine

## 2019-02-25 ENCOUNTER — Encounter: Payer: Self-pay | Admitting: Internal Medicine

## 2019-02-25 ENCOUNTER — Other Ambulatory Visit: Payer: Self-pay

## 2019-02-25 VITALS — BP 118/80 | HR 100 | Temp 97.9°F | Ht 64.0 in | Wt 153.4 lb

## 2019-02-25 DIAGNOSIS — D500 Iron deficiency anemia secondary to blood loss (chronic): Secondary | ICD-10-CM | POA: Diagnosis not present

## 2019-02-25 DIAGNOSIS — K50819 Crohn's disease of both small and large intestine with unspecified complications: Secondary | ICD-10-CM

## 2019-02-25 DIAGNOSIS — E559 Vitamin D deficiency, unspecified: Secondary | ICD-10-CM

## 2019-02-25 DIAGNOSIS — D509 Iron deficiency anemia, unspecified: Secondary | ICD-10-CM

## 2019-02-25 NOTE — Assessment & Plan Note (Addendum)
Better with therapy for Crohn's and iron supplementation recheck ferritin and CBC recheck B12 also

## 2019-02-25 NOTE — Assessment & Plan Note (Addendum)
BetterAfter steroid taper and now on Lialda 2.4 g twice daily.  Time for colonoscopy, we have decided to do that after the first of the year. want to check for endoscopic healing.  If she is not in endoscopic remission need to consider additional treatment.

## 2019-02-25 NOTE — Assessment & Plan Note (Signed)
Follow-up level

## 2019-02-25 NOTE — Patient Instructions (Signed)
We are putting you in for a colonoscopy recall for January 2021.  We will give you printed orders to take to [place] to have done.   I appreciate the opportunity to care for you. [name], MD, [facility]

## 2019-02-25 NOTE — Progress Notes (Signed)
   [name] 43 y.o. March 04, 1976 [id number]  Assessment & Plan:   Crohn's disease of small and large intestines with complication ([place]) BetterAfter steroid taper and now on Lialda 2.4 g twice daily.  Time for colonoscopy, we have decided to do that after the first of the year. want to check for endoscopic healing.  If she is not in endoscopic remission need to consider additional treatment.   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  Some fatigue.  Very busy at work at [employer], mandatory overtime.  She is off the steroids and happy about that and is taking her iron supplementation and Lialda.  The plan was to schedule a follow-up colonoscopy to look for endoscopic remission.  Social history notable that she is staying home a lot because of the Covid pandemic, she plans to have Thanksgiving dinner with her son only.  This is her family in [place] and other places.  We discussed the possibility of a zoom call and she thinks she will try that. No Known Allergies Current Meds  Medication Sig  . ferrous sulfate 325 (65 FE) MG tablet Take 325 mg by mouth 2 (two) times daily with a meal.   . mesalamine (LIALDA) 1.2 g EC tablet Take 2.4 g by mouth 2 (two) times daily.   Past Medical History:  Diagnosis Date  . Crohn's disease ([place])   . Diarrhea   . 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 Rectal bleeding   . Uterine fibroid   . Vitamin D deficiency 09/24/2013   Past Surgical History:  Procedure Laterality Date  . COLONOSCOPY     Social History   Social History Narrative   Single, 1 daughter born  2001 approximately   Careers information officer for [employer]   She is a former smoker, occasional alcohol no drug use   family history includes Breast cancer in her maternal aunt; Diabetes in her father and paternal aunt.   Review of Systems As above  Objective:   Physical Exam BP 118/80   Pulse 100   Temp 97.9 F (36.6 C)   Ht 5' 4"  (1.626 m)   Wt 153 lb 6.4 oz (69.6 kg)   BMI 26.33 kg/m  No acute distress   15 minutes time spent with patient > half in counseling coordination of care

## 2019-03-03 ENCOUNTER — Telehealth: Payer: Self-pay | Admitting: Internal Medicine

## 2019-03-03 NOTE — Telephone Encounter (Signed)
Agree 

## 2019-03-03 NOTE — Telephone Encounter (Signed)
Patient reports that she has been having stomach burning off an on.  She started herself on some left over prednisone for the burning in her stomach.  She started it today..  She is advised to stop the prednisone and begin Prilosec OTC.  She will call me next week if this fails to improve her symptoms.

## 2019-03-18 NOTE — Telephone Encounter (Signed)
Pt states she had a rapid covid test done and it was negative, she reports she has anti-nausea meds already and she will take them.

## 2019-03-18 NOTE — Telephone Encounter (Signed)
Left message for pt to call back  °

## 2019-03-18 NOTE — Telephone Encounter (Signed)
Pt reported that she has been experiencing N/V and early satiety.  Please advise.

## 2019-03-18 NOTE — Telephone Encounter (Signed)
Crohn's patient.  However, no immunosuppressants.  Tapering mesalamine.  See PCP regarding fever and chills.  Can prescribe limited Zofran 4 mg every 4-6 hours as needed for nausea (#10, no refills).

## 2019-03-18 NOTE — Telephone Encounter (Signed)
[name] pt calling, pt reports that last night she had chills last night and was so cold she had to use 2 blankets and her feet were burning they felt so cold. Asked pt is she had a fever and she stated it is low grade at 99. Pt reports she drank some water this morning a threw up and has been nauseated. Discussed with her that she should probably contact her PCP regarding the low grade temp and chills for possible covid. Pt wants to know what she can do regarding the nausea. Please advise as DOD.

## 2019-04-02 ENCOUNTER — Other Ambulatory Visit: Payer: Self-pay | Admitting: Internal Medicine

## 2019-04-02 ENCOUNTER — Encounter (HOSPITAL_COMMUNITY): Payer: Self-pay | Admitting: Emergency Medicine

## 2019-04-02 ENCOUNTER — Telehealth: Payer: Self-pay | Admitting: Internal Medicine

## 2019-04-02 ENCOUNTER — Emergency Department (HOSPITAL_COMMUNITY): Payer: Managed Care, Other (non HMO)

## 2019-04-02 ENCOUNTER — Other Ambulatory Visit: Payer: Self-pay

## 2019-04-02 ENCOUNTER — Emergency Department (HOSPITAL_COMMUNITY)
Admission: EM | Admit: 2019-04-02 | Discharge: 2019-04-02 | Disposition: A | Discharge status: Home / Self Care | Payer: Managed Care, Other (non HMO) | Attending: Emergency Medicine | Admitting: Emergency Medicine

## 2019-04-02 DIAGNOSIS — Z87891 Personal history of nicotine dependence: Secondary | ICD-10-CM | POA: Insufficient documentation

## 2019-04-02 DIAGNOSIS — R112 Nausea with vomiting, unspecified: Secondary | ICD-10-CM | POA: Diagnosis not present

## 2019-04-02 DIAGNOSIS — Z79899 Other long term (current) drug therapy: Secondary | ICD-10-CM | POA: Insufficient documentation

## 2019-04-02 DIAGNOSIS — R1084 Generalized abdominal pain: Secondary | ICD-10-CM | POA: Diagnosis not present

## 2019-04-02 DIAGNOSIS — R197 Diarrhea, unspecified: Secondary | ICD-10-CM | POA: Diagnosis not present

## 2019-04-02 LAB — COMPREHENSIVE METABOLIC PANEL
ALT: 15 U/L (ref 0–44)
AST: 14 U/L — ABNORMAL LOW (ref 15–41)
Albumin: 3.7 g/dL (ref 3.5–5.0)
Alkaline Phosphatase: 101 U/L (ref 38–126)
Anion gap: 13 (ref 5–15)
BUN: 15 mg/dL (ref 6–20)
CO2: 21 mmol/L — ABNORMAL LOW (ref 22–32)
Calcium: 9.4 mg/dL (ref 8.9–10.3)
Chloride: 102 mmol/L (ref 98–111)
Creatinine, Ser: 0.85 mg/dL (ref 0.44–1.00)
GFR calc Af Amer: >60 mL/min (ref >60)
GFR calc non Af Amer: >60 mL/min (ref >60)
Glucose, Bld: 157 mg/dL — ABNORMAL HIGH (ref 70–99)
Potassium: 3.6 mmol/L (ref 3.5–5.1)
Sodium: 136 mmol/L (ref 135–145)
Total Bilirubin: 1.3 mg/dL — ABNORMAL HIGH (ref 0.3–1.2)
Total Protein: 7.8 g/dL (ref 6.5–8.1)

## 2019-04-02 LAB — CBC WITH DIFFERENTIAL/PLATELET
Abs Immature Granulocytes: 0.08 10*3/uL — ABNORMAL HIGH (ref 0.00–0.07)
Basophils Absolute: 0 10*3/uL (ref 0.0–0.1)
Basophils Relative: 0 %
Eosinophils Absolute: 0 10*3/uL (ref 0.0–0.5)
Eosinophils Relative: 0 %
HCT: 39.5 % (ref 36.0–46.0)
Hemoglobin: 12.5 g/dL (ref 12.0–15.0)
Immature Granulocytes: 0 %
Lymphocytes Relative: 2 %
Lymphs Abs: 0.3 10*3/uL — ABNORMAL LOW (ref 0.7–4.0)
MCH: 25.5 pg — ABNORMAL LOW (ref 26.0–34.0)
MCHC: 31.6 g/dL (ref 30.0–36.0)
MCV: 80.4 fL (ref 80.0–100.0)
Monocytes Absolute: 0.8 10*3/uL (ref 0.1–1.0)
Monocytes Relative: 4 %
Neutro Abs: 17.8 10*3/uL — ABNORMAL HIGH (ref 1.7–7.7)
Neutrophils Relative %: 94 %
Platelets: 476 10*3/uL — ABNORMAL HIGH (ref 150–400)
RBC: 4.91 MIL/uL (ref 3.87–5.11)
RDW: 14.3 % (ref 11.5–15.5)
WBC: 19 10*3/uL — ABNORMAL HIGH (ref 4.0–10.5)
nRBC: 0 % (ref 0.0–0.2)

## 2019-04-02 LAB — LIPASE, BLOOD: Lipase: 20 U/L (ref 11–51)

## 2019-04-02 LAB — I-STAT BETA HCG BLOOD, ED (MC, WL, AP ONLY): I-stat hCG, quantitative: <5 m[IU]/mL (ref <5)

## 2019-04-02 MED ORDER — MORPHINE SULFATE 15 MG PO TABS: 15.0000 mg | ORAL_TABLET | ORAL | 0 refills | Status: DC | PRN

## 2019-04-02 MED ORDER — MORPHINE SULFATE (PF) 4 MG/ML IV SOLN
4.0000 mg | Freq: Once | INTRAVENOUS | Status: AC
  Administered 2019-04-02: 4 mg via INTRAVENOUS
  Filled 2019-04-02: qty 1

## 2019-04-02 MED ORDER — FAMOTIDINE IN NACL 20-0.9 MG/50ML-% IV SOLN
20.0000 mg | Freq: Once | INTRAVENOUS | Status: AC
  Administered 2019-04-02: 20 mg via INTRAVENOUS
  Filled 2019-04-02: qty 50

## 2019-04-02 MED ORDER — ONDANSETRON HCL 4 MG/2ML IJ SOLN
4.0000 mg | Freq: Once | INTRAMUSCULAR | Status: AC
  Administered 2019-04-02: 4 mg via INTRAVENOUS
  Filled 2019-04-02: qty 2

## 2019-04-02 MED ORDER — OXYCODONE HCL 5 MG PO TABS
5.0000 mg | ORAL_TABLET | Freq: Once | ORAL | Status: AC
  Administered 2019-04-02: 5 mg via ORAL
  Filled 2019-04-02: qty 1

## 2019-04-02 MED ORDER — ONDANSETRON 4 MG PO TBDP: 4.0000 mg | ORAL_TABLET | Freq: Three times a day (TID) | ORAL | 0 refills | Status: DC | PRN

## 2019-04-02 MED ORDER — IOHEXOL 300 MG/ML  SOLN
100.0000 mL | Freq: Once | INTRAMUSCULAR | Status: AC | PRN
  Administered 2019-04-02: 100 mL via INTRAVENOUS

## 2019-04-02 MED ORDER — ALUM & MAG HYDROXIDE-SIMETH 200-200-20 MG/5ML PO SUSP
15.0000 mL | Freq: Once | ORAL | Status: AC
  Administered 2019-04-02: 15 mL via ORAL
  Filled 2019-04-02: qty 30

## 2019-04-02 MED ORDER — ONDANSETRON HCL 4 MG/2ML IJ SOLN
4.0000 mg | Freq: Once | INTRAMUSCULAR | Status: AC
  Administered 2019-04-02: 10:00:00 4 mg via INTRAVENOUS
  Filled 2019-04-02: qty 2

## 2019-04-02 MED ORDER — SODIUM CHLORIDE 0.9 % IV BOLUS
1000.0000 mL | Freq: Once | INTRAVENOUS | Status: AC
  Administered 2019-04-02: 1000 mL via INTRAVENOUS

## 2019-04-02 NOTE — ED Provider Notes (Signed)
[facility] EMERGENCY DEPARTMENT Provider Note   CSN: [id number] Arrival date & time: 04/02/19  9798     History Chief Complaint  Patient presents with  . Abdominal Pain  . Vomiting    ANNELLA Norris is a 43 y.o. female.  43 yo F with a chief complaints of diffuse abdominal burning.  Is been going on for a few days now.  Associated with some nausea and vomiting and diarrhea.  She has a history of Crohn's disease and has been taking her medications.  Over the past couple days she has been unable to tolerate some of the medications and has been vomiting them up.  She denies fever denies blood in her stool or dark stool.  Describes diffuse abdominal pain that she initially thought was worse in the epigastrium but when I laid her back she felt it was all over the belly.  Nothing seems to make it better or worse.  The history is provided by the patient.  Abdominal Pain Pain location:  Generalized Pain quality: burning   Pain radiates to:  Does not radiate Pain severity:  Moderate Onset quality:  Gradual Duration:  3 days Timing:  Constant Progression:  Worsening Chronicity:  New Relieved by:  Nothing Worsened by:  Nothing Ineffective treatments:  None tried Associated symptoms: diarrhea, nausea and vomiting   Associated symptoms: no chest pain, no chills, no dysuria, no fever and no shortness of breath        Past Medical History:  Diagnosis Date  . Crohn's disease ([place])   . Diarrhea   . 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 Rectal bleeding   . Uterine fibroid   . Vitamin D deficiency 09/24/2013    Patient Active Problem List   Diagnosis Date Noted  . Vitamin D deficiency 09/24/2013  . Crohn's disease of small and large intestines with complication ([place]) 92/02/9416  . 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 COLONOSCOPY       OB History   No obstetric history on file.     Family History  Problem Relation Age of Onset  . Breast cancer Maternal Aunt   . Diabetes Father   . Diabetes Paternal Aunt   . Colon cancer Neg Hx     Social History   Tobacco Use  . Smoking status: Former Smoker    Types: Cigarettes  . Smokeless tobacco: Never Used  Substance Use Topics  . Alcohol use: Not Currently  . Drug use: No    Home Medications Prior to Admission medications   Medication Sig Start Date End Date Taking? Authorizing Provider  ferrous sulfate 325 (65 FE) MG tablet Take 325 mg by mouth 2 (two) times daily with a meal.     [provider]  mesalamine (LIALDA) 1.2 g EC tablet Take 2.4 g by mouth 2 (two) times daily.    [provider]  morphine (MSIR) 15 MG tablet Take 1 tablet (15 mg total) by mouth every 4 (four) hours as needed for severe pain. 04/02/19   Deno Etienne, DO  ondansetron (ZOFRAN ODT) 4 MG disintegrating tablet Take 1 tablet (4 mg total) by mouth every 8 (eight) hours as needed for nausea or vomiting. 04/02/19   Deno Etienne, DO    Allergies    Patient has no known allergies.  Review of Systems  Review of Systems  Constitutional: Negative for chills and fever.  HENT: Negative for congestion and rhinorrhea.   Eyes: Negative for redness and visual disturbance.  Respiratory: Negative for shortness of breath and wheezing.   Cardiovascular: Negative for chest pain and palpitations.  Gastrointestinal: Positive for abdominal pain, diarrhea, nausea and vomiting. Negative for blood in stool.  Genitourinary: Negative for dysuria and urgency.  Musculoskeletal: Negative for arthralgias and myalgias.  Skin: Negative for pallor and wound.  Neurological: Negative for dizziness and headaches.    Physical Exam Updated Vital Signs BP (!) 145/79   Pulse 99   Temp 98 F (36.7 C) (Oral)   Resp 16   SpO2 100%   Physical Exam Vitals and  nursing note reviewed.  Constitutional:      General: She is not in acute distress.    Appearance: She is well-developed. She is not diaphoretic.  HENT:     Head: Normocephalic and atraumatic.  Eyes:     Pupils: Pupils are equal, round, and reactive to light.  Cardiovascular:     Rate and Rhythm: Normal rate and regular rhythm.     Heart sounds: No murmur. No friction rub. No gallop.   Pulmonary:     Effort: Pulmonary effort is normal.     Breath sounds: No wheezing or rales.  Abdominal:     General: There is no distension.     Palpations: Abdomen is soft.     Tenderness: There is abdominal tenderness (mild diffuse).  Musculoskeletal:        General: No tenderness.     Cervical back: Normal range of motion and neck supple.  Skin:    General: Skin is warm and dry.  Neurological:     Mental Status: She is alert and oriented to person, place, and time.  Psychiatric:        Behavior: Behavior normal.     ED Results / Procedures / Treatments   Labs (all labs ordered are listed, but only abnormal results are displayed) Labs Reviewed  CBC WITH DIFFERENTIAL/PLATELET - Abnormal; Notable for the following components:      Result Value   WBC 19.0 (*)    MCH 25.5 (*)    Platelets 476 (*)    Neutro Abs 17.8 (*)    Lymphs Abs 0.3 (*)    Abs Immature Granulocytes 0.08 (*)    All other components within normal limits  COMPREHENSIVE METABOLIC PANEL - Abnormal; Notable for the following components:   CO2 21 (*)    Glucose, Bld 157 (*)    AST 14 (*)    Total Bilirubin 1.3 (*)    All other components within normal limits  LIPASE, BLOOD  I-STAT BETA HCG BLOOD, ED ([facility], [facility], [facility] ONLY)    EKG None  Radiology CT ABDOMEN PELVIS W CONTRAST  Result Date: 04/02/2019 CLINICAL DATA:  Abdominal pain nausea vomiting for 3 days EXAM: CT ABDOMEN AND PELVIS WITH CONTRAST TECHNIQUE: Multidetector CT imaging of the abdomen and pelvis was performed using the standard protocol following bolus  administration of intravenous contrast. CONTRAST:  118m OMNIPAQUE IOHEXOL 300 MG/ML  SOLN COMPARISON:  09/10/2018 FINDINGS: Lower chest: Basilar atelectasis. Hepatobiliary: Liver is normal. Gallbladder without inflammation. No signs of biliary ductal dilation. Pancreas: Pancreas is unremarkable. No signs of pancreatic inflammation or ductal dilation. Spleen: Spleen is normal. Adrenals/Urinary Tract: Adrenal glands are normal. Kidneys enhance symmetrically without signs of focal lesion or evidence of hydronephrosis. Stomach/Bowel: Small bowel distension beginning in the distal  small bowel. Normal appendix accounting for fluid-filled colon. Ascending colon is distended with transition point in the transverse colon with the mucosa is markedly nodular and there is evidence of long segment narrowing of the mid transverse colon spanning approximately 9 cm. Pericolonic stranding is present. Marked mucosal irregularity and thickening of the long segment narrowing is present, perhaps worse when compared to the previous exam. Upper abdominal lymphadenopathy is also noted in the celiac distribution. Small locules of air are trapped along the wall but do not appear to be within the wall of the ascending colon. Similar to the prior study. Ascites in the right hemiabdomen is diminished adjacent to the dilated colon when compared to the prior study. Second area of colonic narrowing at the descending to sigmoid junction is similar to the prior study. Vascular/Lymphatic: SMA and SMV are patent. Mild-to-moderate nodal enlargement in the upper abdomen more abnormal with respect to number than with respect to size. No retroperitoneal adenopathy. No signs of pelvic lymphadenopathy. Reproductive: Enlarged leiomyomatous uterus. Endometrial lesion measuring 2.8 cm in greatest width and extending through the endometrial canal from the fundus. This of uncertain significance. Other: Small volume fluid less than on the prior study in the  right lower quadrant. Musculoskeletal: No acute bone finding or destructive bone process. IMPRESSION: 1. 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 While this may be due to robust inflammatory changes, the possibility of colonic neoplasm with this pattern has been seen in the setting of Crohn's disease, assessment with colonoscopy may be warranted. 2. Numerous mildly enlarged upper abdominal lymph nodes may be reactive, attention on follow-up. 3. Endometrial lesion measuring 2.8 cm in greatest width and extending through the endometrial canal from the fundus of the uterus. This is of uncertain significance. Pelvic ultrasound follow-up is suggested. Findings could represent large pedunculated intra cavitary fibroid, polyp or neoplasm. 4. Small volume ascites in the right hemiabdomen is diminished when compared to the prior study as is inflammation of the descending colon. 5. Normal appendix is. 6. These results were called by telephone at the time of interpretation on 04/02/2019 at 1:43 pm to provider Elyjah Hazan , who verbally acknowledged these results. Electronically Signed   By: Zetta Bills M.D.   On: 04/02/2019 13:44    Procedures Procedures (including critical care time)  Medications Ordered in ED Medications  oxyCODONE (Oxy IR/ROXICODONE) immediate release tablet 5 mg (has no administration in time range)  sodium chloride 0.9 % bolus 1,000 mL (0 mLs Intravenous Stopped 04/02/19 1102)  ondansetron (ZOFRAN) injection 4 mg (4 mg Intravenous Given 04/02/19 0952)  alum & mag hydroxide-simeth (MAALOX/MYLANTA) 200-200-20 MG/5ML suspension 15 mL (15 mLs Oral Given 04/02/19 0954)  morphine 4 MG/ML injection 4 mg (4 mg Intravenous Given 04/02/19 1139)  famotidine (PEPCID) IVPB 20 mg premix (0 mg Intravenous Stopped 04/02/19 1205)  ondansetron (ZOFRAN) injection 4 mg (4 mg Intravenous Given 04/02/19 1139)  iohexol (OMNIPAQUE)  300 MG/ML solution 100 mL (100 mLs Intravenous Contrast Given 04/02/19 1303)    ED Course  I have reviewed the triage vital signs and the nursing notes.  Pertinent labs & imaging results that were available during my care of the patient were reviewed by me and considered in my medical decision making (see chart for details).    MDM Rules/Calculators/A&P                      43 yo F with a chief  complaints of diffuse burning abdominal pain.  Going on for a few days.  Mild diffuse pain on exam.  She otherwise well-appearing though is tachycardic on arrival.  Will give a bolus of IV fluids check lab work.  Reassess.   The patient is feeling much better on reassessment.  Her lab work is concerning for a significant leukocytosis.  She is also persistently tachycardic.  I discussed this with her and recommended obtaining a CT scan to further evaluate.  Patient was somewhat hesitant that she was feeling better and would like to go home.  She called her GI doctors office and they felt that it was appropriate for imaging.  Patient CT scan shows 2 areas of stricture, a partial bowel obstruction.  There is some inflammation though somewhat improved from her scan in June.  There is also some concern for nodularity, radiology is recommending colonoscopy if the patient has not had one recently.  She also has a mass in her uterus, she told me that she does have extensive fibroids.  I suggested that she follow this up with her GYN docs.  She continues to feel well and would like to try and go home.  We will have her call her GI doctor.  1:57 PM:  I have discussed the diagnosis/risks/treatment options with the patient and believe the pt to be eligible for discharge home to follow-up with GI, PCP. We also discussed returning to the ED immediately if new or worsening sx occur. We discussed the sx which are most concerning (e.g., sudden worsening pain, fever, inability to tolerate by mouth) that necessitate immediate  return. Medications administered to the patient during their visit and any new prescriptions provided to the patient are listed below.  Medications given during this visit Medications  oxyCODONE (Oxy IR/ROXICODONE) immediate release tablet 5 mg (has no administration in time range)  sodium chloride 0.9 % bolus 1,000 mL (0 mLs Intravenous Stopped 04/02/19 1102)  ondansetron (ZOFRAN) injection 4 mg (4 mg Intravenous Given 04/02/19 0952)  alum & mag hydroxide-simeth (MAALOX/MYLANTA) 200-200-20 MG/5ML suspension 15 mL (15 mLs Oral Given 04/02/19 0954)  morphine 4 MG/ML injection 4 mg (4 mg Intravenous Given 04/02/19 1139)  famotidine (PEPCID) IVPB 20 mg premix (0 mg Intravenous Stopped 04/02/19 1205)  ondansetron (ZOFRAN) injection 4 mg (4 mg Intravenous Given 04/02/19 1139)  iohexol (OMNIPAQUE) 300 MG/ML solution 100 mL (100 mLs Intravenous Contrast Given 04/02/19 1303)     The patient appears reasonably screen and/or stabilized for discharge and I doubt any other medical condition or other [facility] requiring further screening, evaluation, or treatment in the ED at this time prior to discharge.   Final Clinical Impression(s) / ED Diagnoses Final diagnoses:  Generalized abdominal pain    Rx / DC Orders ED Discharge Orders         Ordered    morphine (MSIR) 15 MG tablet  Every 4 hours PRN     04/02/19 1353    ondansetron (ZOFRAN ODT) 4 MG disintegrating tablet  Every 8 hours PRN     04/02/19 [place], Jayvion Stefanski, DO 04/02/19 1357

## 2019-04-02 NOTE — ED Triage Notes (Signed)
abd pain and burring vomiting since Monday

## 2019-04-02 NOTE — Discharge Instructions (Signed)
Please follow-up with your gastroenterologist.  Based on the CT scan the radiologist recommended that you get a colonoscopy, I know that you already have one scheduled next month discussed with them to see if they want to change the timing.  You had a partial obstruction on your CAT scan.  Since you are able to tolerate by mouth here and are feeling better we will try and send you home.  This something that could get worse, if you have worsening pain develop a fever for unable to eat or drink please return for admission to the hospital.

## 2019-04-02 NOTE — ED Notes (Signed)
Got patient a warm blanket

## 2019-04-02 NOTE — ED Notes (Signed)
Patient Alert and oriented to baseline. Stable and ambulatory to baseline. Patient verbalized understanding of the discharge instructions.  Patient belongings were taken by the patient.   

## 2019-04-02 NOTE — Telephone Encounter (Signed)
Patient notified that she should allow the ED MD to perform any necessary tests to help her.  I notified [name], PA that she is there.  Patient will call back for any additional questions or concerns.

## 2019-04-02 NOTE — ED Notes (Signed)
Got patient on the monitor patient is resting with call bell in reach  ?

## 2019-04-02 NOTE — Telephone Encounter (Signed)
Patient called to let us know she was discharged and the doctor at the hospital would like for her to have the procedure sooner if possible due to some obstructions

## 2019-04-02 NOTE — Telephone Encounter (Signed)
Patient is in [facility] for burning in stomach- and she was supposed to have a colonoscopy with Dr. Carlean Purl next month but at the hospital they asked if she would like to have a CT scan to look at her stomach. She is wondering if Dr. Carlean Purl would like for her to have that done or just wait to have the colonoscopy done.

## 2019-04-03 LAB — CBC WITH DIFFERENTIAL/PLATELET
Basophils Absolute: 0 10*3/uL (ref 0.0–0.2)
Basos: 0 %
EOS (ABSOLUTE): 0 10*3/uL (ref 0.0–0.4)
Eos: 0 %
Hematocrit: 39.2 % (ref 34.0–46.6)
Hemoglobin: 12.2 g/dL (ref 11.1–15.9)
Immature Grans (Abs): 0 10*3/uL (ref 0.0–0.1)
Immature Granulocytes: 0 %
Lymphocytes Absolute: 0.7 10*3/uL (ref 0.7–3.1)
Lymphs: 7 %
MCH: 24.9 pg — ABNORMAL LOW (ref 26.6–33.0)
MCHC: 31.1 g/dL — ABNORMAL LOW (ref 31.5–35.7)
MCV: 80 fL (ref 79–97)
Monocytes Absolute: 0.8 10*3/uL (ref 0.1–0.9)
Monocytes: 8 %
Neutrophils Absolute: 9 10*3/uL — ABNORMAL HIGH (ref 1.4–7.0)
Neutrophils: 85 %
Platelets: 497 10*3/uL — ABNORMAL HIGH (ref 150–450)
RBC: 4.89 x10E6/uL (ref 3.77–5.28)
RDW: 14 % (ref 11.7–15.4)
WBC: 10.6 10*3/uL (ref 3.4–10.8)

## 2019-04-03 LAB — VITAMIN D 25 HYDROXY (VIT D DEFICIENCY, FRACTURES): Vit D, 25-Hydroxy: 9.4 ng/mL — ABNORMAL LOW (ref 30.0–100.0)

## 2019-04-03 LAB — VITAMIN B12: Vitamin B-12: 1011 pg/mL (ref 232–1245)

## 2019-04-03 LAB — FERRITIN: Ferritin: 30 ng/mL (ref 15–150)

## 2019-04-03 NOTE — Telephone Encounter (Signed)
Pt called back and requested an update.

## 2019-04-03 NOTE — Telephone Encounter (Signed)
OK to move up colonoscopy

## 2019-04-03 NOTE — Telephone Encounter (Signed)
Okay to move up her procedure or does she need an office visit.  Please review the ED notes from yesterday

## 2019-04-07 NOTE — Telephone Encounter (Signed)
Patient has been scheduled for colon, pre-visit, and COVID screen

## 2019-04-08 ENCOUNTER — Other Ambulatory Visit: Payer: Self-pay

## 2019-04-08 MED ORDER — VITAMIN D (ERGOCALCIFEROL) 1.25 MG (50000 UNIT) PO CAPS: 50000.0000 [IU] | ORAL_CAPSULE | ORAL | 0 refills | Status: DC

## 2019-04-17 ENCOUNTER — Other Ambulatory Visit: Payer: Self-pay

## 2019-04-17 ENCOUNTER — Ambulatory Visit (INDEPENDENT_AMBULATORY_CARE_PROVIDER_SITE_OTHER): Payer: Self-pay

## 2019-04-17 ENCOUNTER — Other Ambulatory Visit: Payer: Self-pay | Admitting: Internal Medicine

## 2019-04-17 ENCOUNTER — Ambulatory Visit (AMBULATORY_SURGERY_CENTER): Payer: Managed Care, Other (non HMO) | Admitting: *Deleted

## 2019-04-17 ENCOUNTER — Encounter: Payer: Self-pay | Admitting: Internal Medicine

## 2019-04-17 VITALS — Temp 97.3°F | Ht 64.0 in | Wt 147.0 lb

## 2019-04-17 DIAGNOSIS — Z1159 Encounter for screening for other viral diseases: Secondary | ICD-10-CM

## 2019-04-17 DIAGNOSIS — K50819 Crohn's disease of both small and large intestine with unspecified complications: Secondary | ICD-10-CM

## 2019-04-17 NOTE — Progress Notes (Signed)
Patient is here in-person for PV. Patient denies any allergies to eggs or soy. Patient denies any problems with anesthesia/sedation. Patient denies any oxygen use at home. Patient denies taking any diet/weight loss medications or blood thinners. Patient is not being treated for MRSA or C-diff. EMMI education assisgned to the patient for the procedure, this was explained and instructions given to patient. COVID-19 screening test is on 1/7, the pt is aware. Pt is aware that care partner will wait in the car during procedure; if they feel like they will be too hot or cold to wait in the car; they may wait in the 4 th floor lobby. Patient is aware to bring only one care partner. We want them to wear a mask (we do not have any that we can provide them), practice social distancing, and we will check their temperatures when they get here.  I did remind the patient that their care partner needs to stay in the parking lot the entire time and have a cell phone available, we will call them when the pt is ready for discharge. Patient will wear mask into building.

## 2019-04-18 LAB — SARS CORONAVIRUS 2 (TAT 6-24 HRS): SARS Coronavirus 2: NEGATIVE

## 2019-04-21 ENCOUNTER — Ambulatory Visit (AMBULATORY_SURGERY_CENTER): Payer: Managed Care, Other (non HMO) | Admitting: Internal Medicine

## 2019-04-21 ENCOUNTER — Other Ambulatory Visit: Payer: Self-pay

## 2019-04-21 ENCOUNTER — Encounter: Payer: Self-pay | Admitting: Internal Medicine

## 2019-04-21 VITALS — BP 109/71 | HR 78 | Temp 98.1°F | Resp 19 | Ht 64.0 in | Wt 147.0 lb

## 2019-04-21 DIAGNOSIS — K50819 Crohn's disease of both small and large intestine with unspecified complications: Secondary | ICD-10-CM | POA: Diagnosis present

## 2019-04-21 DIAGNOSIS — K56699 Other intestinal obstruction unspecified as to partial versus complete obstruction: Secondary | ICD-10-CM | POA: Diagnosis not present

## 2019-04-21 DIAGNOSIS — K50919 Crohn's disease, unspecified, with unspecified complications: Secondary | ICD-10-CM

## 2019-04-21 DIAGNOSIS — Z1159 Encounter for screening for other viral diseases: Secondary | ICD-10-CM

## 2019-04-21 DIAGNOSIS — Z111 Encounter for screening for respiratory tuberculosis: Secondary | ICD-10-CM

## 2019-04-21 MED ORDER — SODIUM CHLORIDE 0.9 % IV SOLN: 500.0000 mL | Freq: Once | INTRAVENOUS | Status: DC

## 2019-04-21 NOTE — Progress Notes (Signed)
Called to room to assist during endoscopic procedure.  Patient ID and intended procedure confirmed with present staff. Received instructions for my participation in the procedure from the performing physician.  

## 2019-04-21 NOTE — Op Note (Addendum)
[place] Patient Name: [name] Procedure Date: 04/21/2019 2:44 PM MRN: [id number] Endoscopist: [name] , MD Age: 44 Referring MD:  Date of Birth: 06-12-1975 Gender: Female Account #: [id number] Procedure:                Colonoscopy Indications:              Crohn's disease of the small bowel and colon,                            Follow-up of Crohn's disease of the small bowel and                            colon, Disease activity assessment of Crohn's                            disease of the small bowel and colon Medicines:                Propofol per Anesthesia, Monitored Anesthesia Care Procedure:                Pre-Anesthesia Assessment:                           - Prior to the procedure, a History and Physical                            was performed, and patient medications and                            allergies were reviewed. The patient's tolerance of                            previous anesthesia was also reviewed. The risks                            and benefits of the procedure and the sedation                            options and risks were discussed with the patient.                            All questions were answered, and informed consent                            was obtained. Prior Anticoagulants: The patient has                            taken no previous anticoagulant or antiplatelet                            agents. ASA Grade Assessment: II - A patient with                            mild systemic disease. After reviewing the risks  and benefits, the patient was deemed in                            satisfactory condition to undergo the procedure.                           After obtaining informed consent, the colonoscope                            was passed under direct vision. Throughout the                            procedure, the patient's blood pressure, pulse, and   oxygen saturations were monitored continuously. The                            Colonoscope was introduced through the anus with                            the intention of advancing to the cecum. The scope                            was advanced to the sigmoid colon before the                            procedure was aborted. Medications were given. The                            colonoscopy was performed with difficulty due to                            bowel stenosis. The patient tolerated the procedure                            well. The quality of the bowel preparation was                            excellent. The rectum was photographed. Scope In: 2:54:54 PM Scope Out: 3:01:01 PM Total Procedure Duration: 0 hours 6 minutes 7 seconds  Findings:                 The perianal and digital rectal examinations were                            normal.                           A benign-appearing, intrinsic severe stenosis was                            found in the sigmoid colon and was non-traversed.                            Biopsies were taken with a cold forceps for  histology. Verification of patient identification                            for the specimen was done. Estimated blood loss was                            minimal.                           The exam was otherwise without abnormality on                            direct and retroflexion views. Complications:            No immediate complications. Estimated Blood Loss:     Estimated blood loss was minimal. 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                            Biopsied. suspect from Crohn's - gastroscope would                            not pass                           - The examination was otherwise normal on direct                            and retroflexion views. Recommendation:           - Patient has a contact number available for                             emergencies. The signs and symptoms of potential                            delayed complications were discussed with the                            patient. Return to normal activities tomorrow.                            Written discharge instructions were provided to the                            patient.                           - Resume previous diet.                           - Continue present medications.                           - Await pathology results.                           -  Labs in anticipation of biologics today - she                            will likely take to [employer] [facility] [facility]                           - will need to review at [place] also I                            gave her [place] fact sheet - will need more                            education about nature of Crohn's disease and                            treatment I believe [name], MD 04/21/2019 3:26:46 PM This report has been signed electronically.

## 2019-04-21 NOTE — Progress Notes (Signed)
Temp JR  VS DT  Pt's states no medical or surgical changes since previsit or office visit.

## 2019-04-21 NOTE — Progress Notes (Signed)
PT taken to PACU. Monitors in place. VSS. Report given to RN. 

## 2019-04-21 NOTE — Patient Instructions (Addendum)
The Crohn's disease has caused a stricture or narrow area of the colon. I could not get past that with my scope - I took biopsies of the area to understand it better and will call you with results.  You will need to have different treatment which I will discuss and you may also even need surgery (we try to avoid that).  I am sensing you to the lab today for some testing that we will need to know about before changing treatment.  I appreciate the opportunity to care for you. [name], MD, [facility]   Orders for labs by Dr. Carlean Purl  given to you today, take these with you to [place]:   Refer to the procedure report that was given to you for any specific questions about what was found during the examination.  If the procedure report does not answer your questions, please call your gastroenterologist to clarify.  If you requested that your care partner not be given the details of your procedure findings, then the procedure report has been included in a sealed envelope for you to review at your convenience later.  YOU SHOULD EXPECT: Some feelings of bloating in the abdomen. Passage of more gas than usual.  Walking can help get rid of the air that was put into your GI tract during the procedure and reduce the bloating. If you had a lower endoscopy (such as a colonoscopy or flexible sigmoidoscopy) you may notice spotting of blood in your stool or on the toilet paper. If you underwent a bowel prep for your procedure, you may not have a normal bowel movement for a few days.  Please Note:  You might notice some irritation and congestion in your nose or some drainage.  This is from the oxygen used during your procedure.  There is no need for concern and it should clear up in a day or so.  SYMPTOMS TO REPORT IMMEDIATELY:   Following lower endoscopy (colonoscopy or flexible sigmoidoscopy):  Excessive amounts of blood in the  stool  Significant tenderness or worsening of abdominal pains  Swelling of the abdomen that is new, acute  Fever of 100F or higher   For urgent or emergent issues, a gastroenterologist can be reached at any hour by calling [phone].   DIET:  We do recommend a small meal at first, but then you may proceed to your regular diet.  Drink plenty of fluids but you should avoid alcoholic beverages for 24 hours.  ACTIVITY:  You should plan to take it easy for the rest of today and you should NOT DRIVE or use heavy machinery until tomorrow (because of the sedation medicines used during the test).    FOLLOW UP: Our staff will call the number listed on your records 48-72 hours following your procedure to check on you and address any questions or concerns that you may have regarding the information given to you following your procedure. If we do not reach you, we will leave a message.  We will attempt to reach you two times.  During this call, we will ask if you have developed any symptoms of COVID 19. If you develop any symptoms (ie: fever, flu-like symptoms, shortness of breath, cough etc.) before then, please call [phone].  If you test positive for Covid 19 in the 2 weeks post procedure, please call and report this information to [place].    If any biopsies were  taken you will be contacted by phone or by letter within the next 1-3 weeks.  Please call us at [phone] if you have not heard about the biopsies in 3 weeks.    SIGNATURES/CONFIDENTIALITY: You and/or your care partner have signed paperwork which will be entered into your electronic medical record.  These signatures attest to the fact that that the information above on your After Visit Summary has been reviewed and is understood.  Full responsibility of the confidentiality of this discharge information lies with you and/or your care-partner.

## 2019-04-23 ENCOUNTER — Telehealth: Payer: Self-pay

## 2019-04-23 ENCOUNTER — Telehealth: Payer: Self-pay | Admitting: *Deleted

## 2019-04-23 NOTE — Telephone Encounter (Signed)
Pt is returning your call and has some questions.  Asking for a call back to discuss

## 2019-04-23 NOTE — Telephone Encounter (Signed)
  Follow up Call-  Call back number 04/21/2019  Post procedure Call Back phone  # [phone]  Permission to leave phone message Yes  Some recent data might be hidden     Patient questions:  Do you have a fever, pain , or abdominal swelling? No. Pain Score  0 *  Have you tolerated food without any problems? Yes.    Have you been able to return to your normal activities? Yes.    Do you have any questions about your discharge instructions: Diet   No. Medications  No. Follow up visit  No.  Do you have questions or concerns about your Care? Yes.  Pt had questions regarding bx results and continuing her Lialda. Dr. Carlean Purl wanted her to resume previous meds and to wait on bx results. PT verbalized understanding.  Actions: * If pain score is 4 or above: No action needed, pain <4.   1. Have you developed a fever since your procedure? no  2.   Have you had an respiratory symptoms (SOB or cough) since your procedure? no  3.   Have you tested positive for COVID 19 since your procedure no  4.   Have you had any family members/close contacts diagnosed with the COVID 19 since your procedure?  no   If yes to any of these questions please route to Joylene John, RN and [name], Therapist, sports.

## 2019-04-23 NOTE — Telephone Encounter (Signed)
Follow up call made, left message

## 2019-04-23 NOTE — Telephone Encounter (Signed)
2nd follow up call made.  NALM 

## 2019-04-25 ENCOUNTER — Telehealth: Payer: Self-pay | Admitting: Internal Medicine

## 2019-04-25 NOTE — Telephone Encounter (Signed)
Please tell her biopsies show inflammation No cancer Consistent with crohn's disease  I need her to come in week of 25th - looks like 11/27 at 1130 is a good spot so we can go over next steps in treatment

## 2019-04-25 NOTE — Telephone Encounter (Signed)
Spoke to the patient, notified the patient of her results.   Scheduled patient on 1/27 at 11:30 am. Patient aware of appointment.

## 2019-04-25 NOTE — Telephone Encounter (Signed)
Patient called about path and lab results.

## 2019-04-27 LAB — SERIAL MONITORING

## 2019-04-28 LAB — THIOPURINE METABOLITES
6-MMPN Metaboilte: <187 pmol/8x 10E8
6-TGN Metabolite: <37 pmol/8x 10E8

## 2019-04-28 LAB — QUANTIFERON-TB GOLD PLUS
QuantiFERON Mitogen Value: >10 IU/mL
QuantiFERON Nil Value: 0.03 IU/mL
QuantiFERON TB1 Ag Value: 0.04 IU/mL
QuantiFERON TB2 Ag Value: 0.03 IU/mL
QuantiFERON-TB Gold Plus: NEGATIVE

## 2019-04-28 LAB — HEPATITIS B SURFACE ANTIGEN: Hepatitis B Surface Ag: NEGATIVE

## 2019-04-28 LAB — HEPATITIS B CORE ANTIBODY, TOTAL: Hep B Core Total Ab: NEGATIVE

## 2019-04-28 LAB — HEPATITIS B SURFACE ANTIBODY,QUALITATIVE: Hep B Surface Ab, Qual: NONREACTIVE

## 2019-05-07 ENCOUNTER — Ambulatory Visit (INDEPENDENT_AMBULATORY_CARE_PROVIDER_SITE_OTHER): Payer: Managed Care, Other (non HMO) | Admitting: Internal Medicine

## 2019-05-07 ENCOUNTER — Other Ambulatory Visit: Payer: Self-pay | Admitting: Internal Medicine

## 2019-05-07 ENCOUNTER — Encounter: Payer: Self-pay | Admitting: Internal Medicine

## 2019-05-07 VITALS — BP 120/80 | HR 64 | Temp 97.6°F | Ht 64.0 in | Wt 143.1 lb

## 2019-05-07 DIAGNOSIS — K6389 Other specified diseases of intestine: Secondary | ICD-10-CM

## 2019-05-07 DIAGNOSIS — R9389 Abnormal findings on diagnostic imaging of other specified body structures: Secondary | ICD-10-CM | POA: Diagnosis not present

## 2019-05-07 DIAGNOSIS — K50819 Crohn's disease of both small and large intestine with unspecified complications: Secondary | ICD-10-CM | POA: Diagnosis not present

## 2019-05-07 DIAGNOSIS — K56699 Other intestinal obstruction unspecified as to partial versus complete obstruction: Secondary | ICD-10-CM | POA: Diagnosis not present

## 2019-05-07 HISTORY — DX: Other intestinal obstruction unspecified as to partial versus complete obstruction: K56.699

## 2019-05-07 NOTE — Assessment & Plan Note (Signed)
We know she has fibroids but she missed a period this past month and last intercourse was in November, and had periods prior. Refer to Dr. Freda Munro her gynecologist for further evaluation. Question benign versus malignant. May need something beyond imaging. I explained how uterine lesion particularly if malignant could impact plan of overall treatment.

## 2019-05-07 NOTE — Assessment & Plan Note (Signed)
Stricture in the sigmoid colon preventing advancement of even gastroscope which means we cannot reach area of mass to determine etiology. I have some doubts as to whether this would even respond to biologic therapy anyway. Surgery will be curative for the stricture.

## 2019-05-07 NOTE — Assessment & Plan Note (Signed)
Imaging shows a mass lesion in the colon, cannot tell if this is Crohn's related or malignancy. It has features concerning for the latter. Reviewed at joint IBD conference. Plan is for surgery. Options for medical therapy really not good here given the concern over possible malignancy. The patient will be referred to colorectal surgery. I have explained this to her. Understand that this is not typically our first choice but given the concern over potential malignancy biologic therapy is contraindicated I think in proceeding to surgical resection which can be definitive for either though will require Crohn's treatment subsequently is the best option.

## 2019-05-07 NOTE — Progress Notes (Signed)
[name] 44 y.o. May 02, 1975 [id number]  Assessment & Plan:  Crohn's disease of small and large intestines with complication [facility]) Referral to colorectal surgery. See discussion under the problem of colon mass.  Mass of colon Imaging shows a mass lesion in the colon, cannot tell if this is Crohn's related or malignancy. It has features concerning for the latter. Reviewed at joint IBD conference. Plan is for surgery. Options for medical therapy really not good here given the concern over possible malignancy. The patient will be referred to colorectal surgery. I have explained this to her. Understand that this is not typically our first choice but given the concern over potential malignancy biologic therapy is contraindicated I think in proceeding to surgical resection which can be definitive for either though will require Crohn's treatment subsequently is the best option.  Colon stricture ([place]) Stricture in the sigmoid colon preventing advancement of even gastroscope which means we cannot reach area of mass to determine etiology. I have some doubts as to whether this would even respond to biologic therapy anyway. Surgery will be curative for the stricture.  Abnormal finding present on diagnostic imaging of uterus We know she has fibroids but she missed a period this past month and last intercourse was in November, and had periods prior. Refer to Dr. Freda Munro her gynecologist for further evaluation. Question benign versus malignant. May need something beyond imaging. I explained how uterine lesion particularly if malignant could impact plan of overall treatment.    I will send copies to Drs. [name] and Nadeen Landau and Freda Munro  Subjective:   Chief Complaint: Crohn's disease  HPI The patient is here for follow-up, she had an attempted colonoscopy to evaluate Crohn's disease seen at CT scan and known from previous exams, on January 11. There was a sigmoid  stricture with biopsy showing chronic colitis but a gastroscope would not pass. I reviewed her case at IBD conference yesterday with surgery and radiology and other gastroenterologist present. There is concern for mass in the transverse colon area, looks like it could be malignant, so the consensus was surgery over medical treatment and follow-up for her. I have explained this to her today. She had one episode about 10 days ago of nausea and vomiting that was transient but otherwise has not been having those problems. She is moving her bowels and not having significant abdominal pain that I am aware of. She missed her period this month, last intercourse was in November, and she had had regular periods though the December period was "funny". Imaging shows a 2.8 cm uterine lesion as well as fibroids on the CT scan. This was explained to her today again as well. No Known Allergies Current Meds  Medication Sig  . mesalamine (LIALDA) 1.2 g EC tablet Take 2.4 g by mouth 2 (two) times daily.  . Multiple Vitamins-Minerals (MULTIVITAMIN ADULT PO) Take by mouth.   Past Medical History:  Diagnosis Date  . Crohn's disease ([address])   . Diarrhea   . 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 Rectal bleeding   . Uterine fibroid   . Vitamin D deficiency 09/24/2013   Past Surgical History:  Procedure Laterality Date  . COLONOSCOPY  2015   Social History   Social History Narrative   Single, 1 daughter born 2001 approximately   Careers information officer for [employer]   She is a former smoker, occasional alcohol no drug use   family history includes  Breast cancer in her maternal aunt; Diabetes in her father and paternal aunt.   Review of Systems As above  Objective:   Physical Exam BP 120/80   Pulse 64   Temp 97.6 F (36.4 C)   Ht 5' 4"  (1.626 m)   Wt 143 lb 2 oz (64.9 kg)   BMI 24.57 kg/m  NAD  Total time 22 minutes

## 2019-05-07 NOTE — Assessment & Plan Note (Addendum)
Referral to colorectal surgery. See discussion under the problem of colon mass.

## 2019-05-07 NOTE — Patient Instructions (Signed)
We are providing you with a printed order to have a CEA drawn at [facility] have been scheduled for an appointment with _______________ at [facility]. Your appointment is on ______________ at ____________________. Please arrive at _______________ for registration. Make certain to bring a list of current medications, including any over the counter medications or vitamins. Also bring your co-pay if you have one as well as your insurance cards. [facility] is located at [address], [address]. Should you need to reschedule your appointment, please contact them at [phone].   We have made you an appointment with Dr Freda Munro with [facility] for 05/13/2019 at 9:45AM, arrive at 9:30AM.   I appreciate the opportunity to care for you. [name] MD, [facility]

## 2019-05-08 LAB — CEA: CEA: 2.3 ng/mL (ref 0.0–4.7)

## 2019-05-12 ENCOUNTER — Ambulatory Visit: Payer: Self-pay | Admitting: General Surgery

## 2019-05-12 NOTE — H&P (Addendum)
History of Present Illness [name] MD; 0/0/7622 3:17 PM) The patient is a 44 year old female who presents with Crohn's disease. 44 year old female with a 10 year history of Crohn's disease who presents to the office today with worsening obstructive symptoms. She was seen by her gastrologist. After CT scan showed a mass in the transverse colon as well as some terminal ileitis and sigmoid stricture. Dr. Carlean Purl was unable to advance the scope past the sigmoid stricture. This patient's case was discussed in our multidisciplinary inflammatory bowel disease conference and it was thought that a surgical resection would be her best chance at symptom relief as well as evaluating her transverse colon mass. She is having occasional vomiting. No weight loss has been noted.   Past Surgical History Geni Bers [place], [place]; 05/12/2019 2:36 PM) Oral Surgery  Diagnostic Studies History Geni Bers [place], [place]; 05/12/2019 2:36 PM) Colonoscopy within last year Mammogram within last year Pap Smear 1-5 years ago  Allergies Geni Bers [place], RMA; 05/12/2019 2:36 PM) No Known Drug Allergies [05/12/2019]: Allergies Reconciled  Medication History [employer], RMA; 05/12/2019 2:37 PM) Mesalamine (1.2GM Tablet DR, Oral) Active. Multi Vitamin (Oral) Active. Medications Reconciled  Social History Geni Bers [place], RMA; 05/12/2019 2:36 PM) Alcohol use Recently quit alcohol use. Caffeine use Carbonated beverages, Coffee, Tea. No drug use Tobacco use Former smoker.  Family History Geni Bers [place], RMA; 05/12/2019 2:36 PM) Diabetes Mellitus Father.  Pregnancy / Birth History Geni Bers [place], RMA; 05/12/2019 2:36 PM) Age at menarche 74 years. Gravida 4 Irregular periods Length (months) of breastfeeding 3-6 Maternal age 9-20 Para 1  Other Problems Geni Bers [place], RMA; 05/12/2019 2:36 PM) Crohn's Disease     Review of Systems Geni Bers [place] RMA;  05/12/2019 2:36 PM) General Not Present- Appetite Loss, Chills, Fatigue, Fever, Night Sweats, Weight Gain and Weight Loss. Skin Not Present- Change in Wart/Mole, Dryness, Hives, Jaundice, New Lesions, Non-Healing Wounds, Rash and Ulcer. HEENT Not Present- Earache, Hearing Loss, Hoarseness, Nose Bleed, Oral Ulcers, Ringing in the Ears, Seasonal Allergies, Sinus Pain, Sore Throat, Visual Disturbances, Wears glasses/contact lenses and Yellow Eyes. Respiratory Not Present- Bloody sputum, Chronic Cough, Difficulty Breathing, Snoring and Wheezing. Breast Not Present- Breast Mass, Breast Pain, Nipple Discharge and Skin Changes. Cardiovascular Not Present- Chest Pain, Difficulty Breathing Lying Down, Leg Cramps, Palpitations, Rapid Heart Rate, Shortness of Breath and Swelling of Extremities. Gastrointestinal Not Present- Abdominal Pain, Bloating, Bloody Stool, Change in Bowel Habits, Chronic diarrhea, Constipation, Difficulty Swallowing, Excessive gas, Gets full quickly at meals, Hemorrhoids, Indigestion, Nausea, Rectal Pain and Vomiting. Female Genitourinary Not Present- Frequency, Nocturia, Painful Urination, Pelvic Pain and Urgency. Musculoskeletal Not Present- Back Pain, Joint Pain, Joint Stiffness, Muscle Pain, Muscle Weakness and Swelling of Extremities. Neurological Not Present- Decreased Memory, Fainting, Headaches, Numbness, Seizures, Tingling, Tremor, Trouble walking and Weakness. Psychiatric Not Present- Anxiety, Bipolar, Change in Sleep Pattern, Depression, Fearful and Frequent crying. Endocrine Not Present- Cold Intolerance, Excessive Hunger, Hair Changes, Heat Intolerance, Hot flashes and New Diabetes. Hematology Not Present- Blood Thinners, Easy Bruising, Excessive bleeding, Gland problems, HIV and Persistent Infections.  Vitals Geni Bers [place] RMA; 05/12/2019 2:37 PM) 05/12/2019 2:37 PM Weight: 141 lb Height: 64in Body Surface Area: 1.69 m Body Mass Index: 24.2 kg/m  Temp.:  98.65F(Temporal)  Pulse: 132 (Regular)  P.OX: 97% (Room air) BP: 118/78 (Sitting, Left Arm, Standard)        Physical Exam [name] MD; 09/10/3352 3:39 PM)  General Mental Status-Alert. General Appearance-Not in acute distress. Build & Nutrition-Well nourished. Posture-Normal posture. Gait-Normal.  Head and Neck  Head-normocephalic, atraumatic with no lesions or palpable masses. Trachea-midline. Thyroid Gland Characteristics - normal size and consistency and no palpable nodules.  Chest and Lung Exam Chest and lung exam reveals -on auscultation, normal breath sounds, no adventitious sounds and normal vocal resonance.  Cardiovascular Cardiovascular examination reveals -normal heart sounds, regular rate and rhythm with no murmurs and no digital clubbing, cyanosis, edema, increased warmth or tenderness.  Abdomen Inspection Inspection of the abdomen reveals - No Hernias. Palpation/Percussion Palpation and Percussion of the abdomen reveal - Soft, Non Tender, No Rigidity (guarding), No hepatosplenomegaly and No Palpable abdominal masses.  Neurologic Neurologic evaluation reveals -alert and oriented x 3 with no impairment of recent or remote memory, normal attention span and ability to concentrate, normal sensation and normal coordination.  Musculoskeletal Normal Exam - Bilateral-Upper Extremity Strength Normal and Lower Extremity Strength Normal.    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 She underwent a CT scan in December 2020 which showed a partial obstruction of the transverse colon associated with significant nodularity and masslike effect with robust inflammatory  changes as well as a sigmoid stricture and terminal ileum inflammation. The right colon is significantly dilated. She is currently on mesalamine. I have recommended that we perform a total abdominal colectomy with ileorectal or ileosigmoid anastomosis. We have discussed this in detail including risk of postoperative bleeding, intraoperative bleeding and anastomotic dehiscence. She has an endometrial lesion noted in her uterus. She is going to see her gynecologist for this week for evaluation of this. The surgery and anatomy were described to the patient as well as the risks of surgery and the possible complications.  These include: Bleeding, deep abdominal infections and possible wound complications such as hernia and infection, damage to adjacent structures, leak of surgical connections, which can lead to other surgeries and possibly an ostomy, possible need for other procedures, such as abscess drains in radiology, possible prolonged hospital stay, possible diarrhea from removal of part of the colon, possible constipation from narcotics, prolonged fatigue/weakness or appetite loss, possible early recurrence of of disease, possible complications of their medical problems such as heart disease or arrhythmias or lung problems, death (less than 1%). I believe the patient understands and wishes to proceed with the surgery.

## 2019-05-13 ENCOUNTER — Telehealth: Payer: Self-pay | Admitting: Internal Medicine

## 2019-05-13 NOTE — Telephone Encounter (Signed)
Pt would like to speak with Dr. Celesta Aver nurse about her visit with colorectal surgeon yesterday. Pls call her.

## 2019-05-13 NOTE — Telephone Encounter (Signed)
Spoke with pt and she is aware.

## 2019-05-13 NOTE — Telephone Encounter (Signed)
Pt states Dr. Carlean Purl had mentioned that she would need a partial colectomy. Reports when she saw Dr. Marcello Moores yesterday she was told she would need a total colectomy. Pt states this contradicts what Dr. Carlean Purl told her and she wants to know his thoughts. Please advise.

## 2019-05-13 NOTE — Telephone Encounter (Signed)
If that is what she heard then I misspoke.  I was trying to communicate she would keep her rectum and not have a colostomy.  Dr. Marcello Moores is the expert here and there are good reasons as to why she needs to remove the whole colon but fortunately get to keep the rectum

## 2019-06-20 NOTE — Progress Notes (Addendum)
Incentive Spirometer  An incentive spirometer is a tool that can help keep your lungs clear and active. This tool measures how well you are filling your lungs with each breath. Taking long deep breaths may help reverse or decrease the chance of developing breathing (pulmonary) problems (especially infection) following:  A long period of time when you are unable to move or be active. BEFORE THE PROCEDURE   If the spirometer includes an indicator to show your best effort, your nurse or respiratory therapist will set it to a desired goal.  If possible, sit up straight or lean slightly forward. Try not to slouch.  Hold the incentive spirometer in an upright position. INSTRUCTIONS FOR USE  1. Sit on the edge of your bed if possible, or sit up as far as you can in bed or on a chair. 2. Hold the incentive spirometer in an upright position. 3. Breathe out normally. 4. Place the mouthpiece in your mouth and seal your lips tightly around it. 5. Breathe in slowly and as deeply as possible, raising the piston or the ball toward the top of the column. 6. Hold your breath for 3-5 seconds or for as long as possible. Allow the piston or ball to fall to the bottom of the column. 7. Remove the mouthpiece from your mouth and breathe out normally. 8. Rest for a few seconds and repeat Steps 1 through 7 at least 10 times every 1-2 hours when you are awake. Take your time and take a few normal breaths between deep breaths. 9. The spirometer may include an indicator to show your best effort. Use the indicator as a goal to work toward during each repetition. 10. After each set of 10 deep breaths, practice coughing to be sure your lungs are clear. If you have an incision (the cut made at the time of surgery), support your incision when coughing by placing a pillow or rolled up towels firmly against it. Once you are able to get out of bed, walk around indoors and cough well. You may stop using the incentive  spirometer when instructed by your caregiver.  RISKS AND COMPLICATIONS  Take your time so you do not get dizzy or light-headed.  If you are in pain, you may need to take or ask for pain medication before doing incentive spirometry. It is harder to take a deep breath if you are having pain. AFTER USE  Rest and breathe slowly and easily.  It can be helpful to keep track of a log of your progress. Your caregiver can provide you with a simple table to help with this. If you are using the spirometer at home, follow these instructions: [place] IF:   You are having difficultly using the spirometer.  You have trouble using the spirometer as often as instructed.  Your pain medication is not giving enough relief while using the spirometer.  You develop fever of 100.5 F (38.1 C) or higher. SEEK IMMEDIATE MEDICAL CARE IF:   You cough up bloody sputum that had not been present before.  You develop fever of 102 F (38.9 C) or greater.  You develop worsening pain at or near the incision site. MAKE SURE YOU:   Understand these instructions.  Will watch your condition.  Will get help right away if you are not doing well or get worse. Document Released: 08/07/2006 Document Revised: 06/19/2011 Document Reviewed: 10/08/2006 ExitCare Patient Information 2014 [place].   ________________________________________________________________________ DUE TO COVID-19 ONLY ONE VISITOR IS  ALLOWED TO COME WITH YOU AND STAY IN THE WAITING ROOM ONLY DURING PRE OP AND PROCEDURE DAY OF SURGERY. THE 1 VISITOR MAY VISIT WITH YOU AFTER SURGERY IN YOUR PRIVATE ROOM DURING VISITING HOURS ONLY!  YOU NEED TO HAVE A COVID 19 TEST ON____3/16/21 ___ @__305PM_____ , THIS TEST MUST BE DONE BEFORE SURGERY, COME  [address], [address] , [address].  ([place]) ONCE YOUR COVID TEST IS COMPLETED, PLEASE BEGIN THE QUARANTINE INSTRUCTIONS AS OUTLINED IN YOUR HANDOUT.                 CHANY WOOLWORTH  06/20/2019   Your procedure is scheduled on:  06/27/2019   Report to [facility] Main  Entrance   Report to admitting at   [place]      Call this number if you have problems the morning of surgery [phone]    Remember: Do not eat food or drink liquids :After Midnight. BRUSH YOUR TEETH MORNING OF SURGERY AND RINSE YOUR MOUTH OUT, NO CHEWING GUM CANDY OR MINTS.     Take these medicines the morning of surgery with A SIP OF WATER:   DO NOT TAKE ANY DIABETIC MEDICATIONS DAY OF YOUR SURGERY                               You may not have any metal on your body including hair pins and              piercings  Do not wear jewelry, make-up, lotions, powders or perfumes, deodorant             Do not wear nail polish on your fingernails.  Do not shave  48 hours prior to surgery.     Do not bring valuables to the hospital. [facility].  Contacts, dentures or bridgework may not be worn into surgery.  Leave suitcase in the car. After surgery it may be brought to your room.     Patients discharged the day of surgery will not be allowed to drive home. IF YOU ARE HAVING SURGERY AND GOING HOME THE SAME DAY, YOU MUST HAVE AN ADULT TO DRIVE YOU HOME AND BE WITH YOU FOR 24 HOURS. YOU MAY GO HOME BY TAXI OR UBER OR ORTHERWISE, BUT AN ADULT MUST ACCOMPANY YOU HOME AND STAY WITH YOU FOR 24 HOURS.  Name and phone number of your driver:  Special Instructions: N/A              Please read over the following fact sheets you were given: _____________________________________________________________________                CLEAR LIQUID DIET  ON DAY OF BOWEL PREP    Foods Allowed                                                                     Foods Excluded  Coffee and tea, regular and decaf  liquids that you cannot  Plain Jell-O any favor except red or purple                                            see through such as: Fruit ices (not with fruit pulp)                                     milk, soups, orange juice  Iced Popsicles                                    All solid food Carbonated beverages, regular and diet                                    Cranberry, grape and apple juices Sports drinks like Gatorade Lightly seasoned clear broth or consume(fat free) Sugar, honey syrup  Sample Menu Breakfast                                Lunch                                     Supper Cranberry juice                    Beef broth                            Chicken broth Jell-O                                     Grape juice                           Apple juice Coffee or tea                        Jell-O                                      Popsicle                                                Coffee or tea                        Coffee or tea  _____________________________________________________________________  DRINK 2 PRESURGERY ENSURE DRINKS THE NIGHT BEFORE SURGERY AT  1000 PM AND 1 PRESURGERY DRINK THE DAY OF THE PROCEDURE 3 HOURS PRIOR TO SCHEDULED SURGERY. NO SOLIDS AFTER MIDNIGHT THE DAY PRIOR TO THE SURGERY. NOTHING BY MOUTH EXCEPT CLEAR LIQUIDS UNTIL THREE HOURS PRIOR TO SCHEDULED SURGERY. PLEASE FINISH PRESURGERY ENSURE DRINK PER SURGEON  ORDER 3 HOURS PRIOR TO SCHEDULED SURGERY TIME WHICH NEEDS TO BE COMPLETED AT __1100AM _______. [place] - Preparing for Surgery Before surgery, you can play an important role.  Because skin is not sterile, your skin needs to be as free of germs as possible.  You can reduce the number of germs on your skin by washing with CHG (chlorahexidine gluconate) soap before surgery.  CHG is an antiseptic cleaner which kills germs and bonds with the skin to continue killing germs even after washing. Please DO NOT use if you have an allergy to CHG or antibacterial soaps.  If your skin becomes reddened/irritated stop using the CHG and inform your nurse  when you arrive at Short Stay. Do not shave (including legs and underarms) for at least 48 hours prior to the first CHG shower.  You may shave your face/neck. Please follow these instructions carefully:  1.  Shower with CHG Soap the night before surgery and the  morning of Surgery.  2.  If you choose to wash your hair, wash your hair first as usual with your  normal  shampoo.  3.  After you shampoo, rinse your hair and body thoroughly to remove the  shampoo.                           4.  Use CHG as you would any other liquid soap.  You can apply chg directly  to the skin and wash                       Gently with a scrungie or clean washcloth.  5.  Apply the CHG Soap to your body ONLY FROM THE NECK DOWN.   Do not use on face/ open                           Wound or open sores. Avoid contact with eyes, ears mouth and genitals (private parts).                       Wash face,  Genitals (private parts) with your normal soap.             6.  Wash thoroughly, paying special attention to the area where your surgery  will be performed.  7.  Thoroughly rinse your body with warm water from the neck down.  8.  DO NOT shower/wash with your normal soap after using and rinsing off  the CHG Soap.                9.  Pat yourself dry with a clean towel.            10.  Wear clean pajamas.            11.  Place clean sheets on your bed the night of your first shower and do not  sleep with pets. Day of Surgery : Do not apply any lotions/deodorants the morning of surgery.  Please wear clean clothes to the hospital/surgery center.  FAILURE TO FOLLOW THESE INSTRUCTIONS MAY RESULT IN THE CANCELLATION OF YOUR SURGERY PATIENT SIGNATURE_________________________________  NURSE SIGNATURE__________________________________  ________________________________________________________________________

## 2019-06-23 ENCOUNTER — Encounter (HOSPITAL_COMMUNITY): Payer: Self-pay

## 2019-06-23 ENCOUNTER — Encounter (HOSPITAL_COMMUNITY)
Admission: RE | Admit: 2019-06-23 | Discharge: 2019-06-23 | Disposition: A | Discharge status: Home / Self Care | Payer: Managed Care, Other (non HMO) | Source: Ambulatory Visit | Attending: General Surgery | Admitting: General Surgery

## 2019-06-23 ENCOUNTER — Other Ambulatory Visit: Payer: Self-pay

## 2019-06-23 NOTE — Progress Notes (Signed)
Patient states at time of phone call appt she has not received bowel prep instructions per DR Marcello Moores.  Instructed patient to call office of Dr Marcello Moores and speak with Triage and ask for bowel prep instructions. Patient voiced understanding.

## 2019-06-24 ENCOUNTER — Other Ambulatory Visit: Payer: Self-pay

## 2019-06-24 ENCOUNTER — Encounter (HOSPITAL_COMMUNITY)
Admission: RE | Admit: 2019-06-24 | Discharge: 2019-06-24 | Disposition: A | Discharge status: Home / Self Care | Payer: Managed Care, Other (non HMO) | Source: Ambulatory Visit | Attending: General Surgery | Admitting: General Surgery

## 2019-06-24 ENCOUNTER — Other Ambulatory Visit (HOSPITAL_COMMUNITY)
Admission: RE | Admit: 2019-06-24 | Discharge: 2019-06-24 | Disposition: A | Discharge status: Home / Self Care | Payer: Managed Care, Other (non HMO) | Source: Ambulatory Visit | Attending: General Surgery | Admitting: General Surgery

## 2019-06-24 DIAGNOSIS — Z01812 Encounter for preprocedural laboratory examination: Secondary | ICD-10-CM | POA: Insufficient documentation

## 2019-06-24 DIAGNOSIS — Z20822 Contact with and (suspected) exposure to covid-19: Secondary | ICD-10-CM | POA: Insufficient documentation

## 2019-06-24 LAB — CBC
HCT: 35 % — ABNORMAL LOW (ref 36.0–46.0)
Hemoglobin: 10.5 g/dL — ABNORMAL LOW (ref 12.0–15.0)
MCH: 24.1 pg — ABNORMAL LOW (ref 26.0–34.0)
MCHC: 30 g/dL (ref 30.0–36.0)
MCV: 80.5 fL (ref 80.0–100.0)
Platelets: 317 10*3/uL (ref 150–400)
RBC: 4.35 MIL/uL (ref 3.87–5.11)
RDW: 18.2 % — ABNORMAL HIGH (ref 11.5–15.5)
WBC: 8.5 10*3/uL (ref 4.0–10.5)
nRBC: 0 % (ref 0.0–0.2)

## 2019-06-24 LAB — COMPREHENSIVE METABOLIC PANEL
ALT: 10 U/L (ref 0–44)
AST: 13 U/L — ABNORMAL LOW (ref 15–41)
Albumin: 3.5 g/dL (ref 3.5–5.0)
Alkaline Phosphatase: 75 U/L (ref 38–126)
Anion gap: 7 (ref 5–15)
BUN: 12 mg/dL (ref 6–20)
CO2: 24 mmol/L (ref 22–32)
Calcium: 8.5 mg/dL — ABNORMAL LOW (ref 8.9–10.3)
Chloride: 107 mmol/L (ref 98–111)
Creatinine, Ser: 0.74 mg/dL (ref 0.44–1.00)
GFR calc Af Amer: >60 mL/min (ref >60)
GFR calc non Af Amer: >60 mL/min (ref >60)
Glucose, Bld: 96 mg/dL (ref 70–99)
Potassium: 4.4 mmol/L (ref 3.5–5.1)
Sodium: 138 mmol/L (ref 135–145)
Total Bilirubin: 0.5 mg/dL (ref 0.3–1.2)
Total Protein: 7 g/dL (ref 6.5–8.1)

## 2019-06-24 LAB — SARS CORONAVIRUS 2 (TAT 6-24 HRS): SARS Coronavirus 2: NEGATIVE

## 2019-06-24 NOTE — Progress Notes (Signed)
Anesthesia Review:  PCP: Cardiologist : Chest x-ray : EKG : Echo : Cardiac Cath :  Sleep Study/ CPAP : Fasting Blood Sugar :      / Checks Blood Sugar -- times a day:   Blood Thinner/ Instructions /Last Dose: ASA / Instructions/ Last Dose :   Patient denies shortness of breath, chest pain, fever, and cough at this phone interview. Please note CBC results done 06/24/2019.  Patient is not a [employer]hov[employer] Witness but does refuse blood.  Note sent via epic to DR Marcello Moores and placed in all other areas need to document.

## 2019-06-24 NOTE — Progress Notes (Signed)
CBC results routed to Dr Marcello Moores.

## 2019-06-24 NOTE — Progress Notes (Signed)
Patient states at time of preop phone call that she is not a Jehov[employer] Witness but she does refuse blood.

## 2019-06-25 LAB — CEA: CEA: 2 ng/mL (ref 0.0–4.7)

## 2019-06-26 MED ORDER — BUPIVACAINE LIPOSOME 1.3 % IJ SUSP
20.0000 mL | Freq: Once | INTRAMUSCULAR | Status: DC
  Filled 2019-06-26: qty 20

## 2019-06-27 ENCOUNTER — Encounter (HOSPITAL_COMMUNITY): Payer: Self-pay | Admitting: General Surgery

## 2019-06-27 ENCOUNTER — Inpatient Hospital Stay (HOSPITAL_COMMUNITY)
Admission: RE | Admit: 2019-06-27 | Discharge: 2019-07-02 | DRG: 331 | Disposition: A | Discharge status: Home / Self Care | Payer: Managed Care, Other (non HMO) | Attending: General Surgery | Admitting: General Surgery

## 2019-06-27 ENCOUNTER — Inpatient Hospital Stay (HOSPITAL_COMMUNITY): Payer: Managed Care, Other (non HMO) | Admitting: Anesthesiology

## 2019-06-27 ENCOUNTER — Telehealth (HOSPITAL_COMMUNITY): Payer: Self-pay | Admitting: *Deleted

## 2019-06-27 ENCOUNTER — Other Ambulatory Visit: Payer: Self-pay

## 2019-06-27 ENCOUNTER — Inpatient Hospital Stay (HOSPITAL_COMMUNITY): Payer: Managed Care, Other (non HMO) | Admitting: Physician Assistant

## 2019-06-27 ENCOUNTER — Encounter (HOSPITAL_COMMUNITY)
Admission: RE | Disposition: A | Discharge status: Home / Self Care | Payer: Self-pay | Source: Other Acute Inpatient Hospital | Attending: General Surgery

## 2019-06-27 DIAGNOSIS — Z87891 Personal history of nicotine dependence: Secondary | ICD-10-CM | POA: Diagnosis not present

## 2019-06-27 DIAGNOSIS — Z79899 Other long term (current) drug therapy: Secondary | ICD-10-CM | POA: Diagnosis not present

## 2019-06-27 DIAGNOSIS — Z833 Family history of diabetes mellitus: Secondary | ICD-10-CM

## 2019-06-27 DIAGNOSIS — K50112 Crohn's disease of large intestine with intestinal obstruction: Secondary | ICD-10-CM | POA: Diagnosis present

## 2019-06-27 DIAGNOSIS — Z20822 Contact with and (suspected) exposure to covid-19: Secondary | ICD-10-CM | POA: Diagnosis present

## 2019-06-27 HISTORY — PX: COLECTOMY: SHX59

## 2019-06-27 LAB — PREGNANCY, URINE: Preg Test, Ur: NEGATIVE

## 2019-06-27 SURGERY — COLECTOMY, PARTIAL, ROBOT-ASSISTED, LAPAROSCOPIC
Anesthesia: General | Site: Abdomen

## 2019-06-27 MED ORDER — ALBUMIN HUMAN 5 % IV SOLN
INTRAVENOUS | Status: AC
  Filled 2019-06-27: qty 500

## 2019-06-27 MED ORDER — PHENYLEPHRINE HCL-NACL 10-0.9 MG/250ML-% IV SOLN
INTRAVENOUS | Status: DC | PRN
  Administered 2019-06-27: 80 ug/min via INTRAVENOUS

## 2019-06-27 MED ORDER — TRAMADOL HCL 50 MG PO TABS
50.0000 mg | ORAL_TABLET | Freq: Four times a day (QID) | ORAL | Status: DC | PRN
  Administered 2019-06-27 – 2019-07-01 (×10): 50 mg via ORAL
  Filled 2019-06-27 (×10): qty 1

## 2019-06-27 MED ORDER — FENTANYL CITRATE (PF) 100 MCG/2ML IJ SOLN
25.0000 ug | INTRAMUSCULAR | Status: DC | PRN
  Administered 2019-06-27 (×2): 50 ug via INTRAVENOUS

## 2019-06-27 MED ORDER — PROPOFOL 10 MG/ML IV BOLUS
INTRAVENOUS | Status: AC
  Filled 2019-06-27: qty 20

## 2019-06-27 MED ORDER — PHENYLEPHRINE 40 MCG/ML (10ML) SYRINGE FOR IV PUSH (FOR BLOOD PRESSURE SUPPORT)
PREFILLED_SYRINGE | INTRAVENOUS | Status: DC | PRN
  Administered 2019-06-27 (×2): 80 ug via INTRAVENOUS
  Administered 2019-06-27: 120 ug via INTRAVENOUS

## 2019-06-27 MED ORDER — KETAMINE HCL 10 MG/ML IJ SOLN
INTRAMUSCULAR | Status: DC | PRN
  Administered 2019-06-27: 10 mg via INTRAVENOUS
  Administered 2019-06-27: 30 mg via INTRAVENOUS
  Administered 2019-06-27: 10 mg via INTRAVENOUS

## 2019-06-27 MED ORDER — ONDANSETRON HCL 4 MG/2ML IJ SOLN: 4.0000 mg | Freq: Four times a day (QID) | INTRAMUSCULAR | Status: DC | PRN

## 2019-06-27 MED ORDER — LACTATED RINGERS IR SOLN
Status: DC | PRN
  Administered 2019-06-27: 1000 mL

## 2019-06-27 MED ORDER — FENTANYL CITRATE (PF) 100 MCG/2ML IJ SOLN
INTRAMUSCULAR | Status: AC
  Filled 2019-06-27: qty 2

## 2019-06-27 MED ORDER — HEPARIN SODIUM (PORCINE) 5000 UNIT/ML IJ SOLN
5000.0000 [IU] | Freq: Once | INTRAMUSCULAR | Status: AC
  Administered 2019-06-27: 5000 [IU] via SUBCUTANEOUS
  Filled 2019-06-27: qty 1

## 2019-06-27 MED ORDER — ALVIMOPAN 12 MG PO CAPS
12.0000 mg | ORAL_CAPSULE | Freq: Two times a day (BID) | ORAL | Status: DC
  Administered 2019-06-28 – 2019-06-29 (×3): 12 mg via ORAL
  Filled 2019-06-27 (×3): qty 1

## 2019-06-27 MED ORDER — ONDANSETRON HCL 4 MG/2ML IJ SOLN
INTRAMUSCULAR | Status: AC
  Filled 2019-06-27: qty 2

## 2019-06-27 MED ORDER — SACCHAROMYCES BOULARDII 250 MG PO CAPS
250.0000 mg | ORAL_CAPSULE | Freq: Two times a day (BID) | ORAL | Status: DC
  Administered 2019-06-27 – 2019-07-02 (×10): 250 mg via ORAL
  Filled 2019-06-27 (×10): qty 1

## 2019-06-27 MED ORDER — ALVIMOPAN 12 MG PO CAPS
12.0000 mg | ORAL_CAPSULE | ORAL | Status: AC
  Administered 2019-06-27: 12 mg via ORAL
  Filled 2019-06-27: qty 1

## 2019-06-27 MED ORDER — LIDOCAINE HCL 2 % IJ SOLN
INTRAMUSCULAR | Status: AC
  Filled 2019-06-27: qty 20

## 2019-06-27 MED ORDER — SODIUM CHLORIDE 0.9 % IV SOLN
2.0000 g | INTRAVENOUS | Status: AC
  Administered 2019-06-27: 2 g via INTRAVENOUS
  Filled 2019-06-27: qty 2

## 2019-06-27 MED ORDER — HYDROMORPHONE HCL 1 MG/ML IJ SOLN
0.5000 mg | INTRAMUSCULAR | Status: DC | PRN
  Administered 2019-06-27 – 2019-07-01 (×9): 0.5 mg via INTRAVENOUS
  Filled 2019-06-27 (×9): qty 0.5

## 2019-06-27 MED ORDER — PHENYLEPHRINE HCL (PRESSORS) 10 MG/ML IV SOLN
INTRAVENOUS | Status: AC
  Filled 2019-06-27: qty 1

## 2019-06-27 MED ORDER — MIDAZOLAM HCL 5 MG/5ML IJ SOLN
INTRAMUSCULAR | Status: DC | PRN
  Administered 2019-06-27: 2 mg via INTRAVENOUS

## 2019-06-27 MED ORDER — DEXAMETHASONE SODIUM PHOSPHATE 10 MG/ML IJ SOLN
INTRAMUSCULAR | Status: DC | PRN
  Administered 2019-06-27: 6 mg via INTRAVENOUS

## 2019-06-27 MED ORDER — KCL IN DEXTROSE-NACL 20-5-0.45 MEQ/L-%-% IV SOLN
INTRAVENOUS | Status: DC
  Filled 2019-06-27 (×4): qty 1000

## 2019-06-27 MED ORDER — ROCURONIUM BROMIDE 10 MG/ML (PF) SYRINGE
PREFILLED_SYRINGE | INTRAVENOUS | Status: AC
  Filled 2019-06-27: qty 10

## 2019-06-27 MED ORDER — GABAPENTIN 300 MG PO CAPS
300.0000 mg | ORAL_CAPSULE | ORAL | Status: AC
  Administered 2019-06-27: 300 mg via ORAL
  Filled 2019-06-27: qty 1

## 2019-06-27 MED ORDER — KETAMINE HCL 10 MG/ML IJ SOLN
INTRAMUSCULAR | Status: AC
  Filled 2019-06-27: qty 1

## 2019-06-27 MED ORDER — LIDOCAINE 2% (20 MG/ML) 5 ML SYRINGE
INTRAMUSCULAR | Status: AC
  Filled 2019-06-27: qty 5

## 2019-06-27 MED ORDER — ENOXAPARIN SODIUM 40 MG/0.4ML ~~LOC~~ SOLN
40.0000 mg | SUBCUTANEOUS | Status: DC
  Administered 2019-06-28 – 2019-07-02 (×5): 40 mg via SUBCUTANEOUS
  Filled 2019-06-27 (×5): qty 0.4

## 2019-06-27 MED ORDER — ENSURE SURGERY PO LIQD
237.0000 mL | Freq: Two times a day (BID) | ORAL | Status: DC
  Administered 2019-06-28 – 2019-06-30 (×5): 237 mL via ORAL
  Filled 2019-06-27 (×8): qty 237

## 2019-06-27 MED ORDER — ALBUMIN HUMAN 5 % IV SOLN: INTRAVENOUS | Status: DC | PRN

## 2019-06-27 MED ORDER — BUPIVACAINE LIPOSOME 1.3 % IJ SUSP
INTRAMUSCULAR | Status: DC | PRN
  Administered 2019-06-27: 20 mL

## 2019-06-27 MED ORDER — MIDAZOLAM HCL 2 MG/2ML IJ SOLN
INTRAMUSCULAR | Status: AC
  Filled 2019-06-27: qty 2

## 2019-06-27 MED ORDER — FENTANYL CITRATE (PF) 100 MCG/2ML IJ SOLN
INTRAMUSCULAR | Status: DC | PRN
  Administered 2019-06-27 (×7): 50 ug via INTRAVENOUS
  Administered 2019-06-27: 100 ug via INTRAVENOUS
  Administered 2019-06-27: 50 ug via INTRAVENOUS

## 2019-06-27 MED ORDER — SODIUM CHLORIDE 0.9 % IV SOLN
2.0000 g | Freq: Two times a day (BID) | INTRAVENOUS | Status: AC
  Administered 2019-06-28: 2 g via INTRAVENOUS
  Filled 2019-06-27: qty 2

## 2019-06-27 MED ORDER — LACTATED RINGERS IV SOLN: INTRAVENOUS | Status: DC | PRN

## 2019-06-27 MED ORDER — LACTATED RINGERS IV SOLN: INTRAVENOUS | Status: DC

## 2019-06-27 MED ORDER — 0.9 % SODIUM CHLORIDE (POUR BTL) OPTIME
TOPICAL | Status: DC | PRN
  Administered 2019-06-27: 2000 mL

## 2019-06-27 MED ORDER — ONDANSETRON HCL 4 MG PO TABS
4.0000 mg | ORAL_TABLET | Freq: Four times a day (QID) | ORAL | Status: DC | PRN
  Administered 2019-06-29: 4 mg via ORAL
  Filled 2019-06-27: qty 1

## 2019-06-27 MED ORDER — MESALAMINE 1.2 G PO TBEC
2.4000 g | DELAYED_RELEASE_TABLET | Freq: Two times a day (BID) | ORAL | Status: DC
  Administered 2019-06-27 – 2019-07-02 (×10): 2.4 g via ORAL
  Filled 2019-06-27 (×11): qty 2

## 2019-06-27 MED ORDER — PROPOFOL 10 MG/ML IV BOLUS
INTRAVENOUS | Status: DC | PRN
  Administered 2019-06-27: 150 mg via INTRAVENOUS

## 2019-06-27 MED ORDER — ROCURONIUM BROMIDE 50 MG/5ML IV SOSY
PREFILLED_SYRINGE | INTRAVENOUS | Status: DC | PRN
  Administered 2019-06-27 (×2): 20 mg via INTRAVENOUS
  Administered 2019-06-27: 60 mg via INTRAVENOUS

## 2019-06-27 MED ORDER — LIDOCAINE 2% (20 MG/ML) 5 ML SYRINGE
INTRAMUSCULAR | Status: DC | PRN
  Administered 2019-06-27: 40 mg via INTRAVENOUS

## 2019-06-27 MED ORDER — FENTANYL CITRATE (PF) 250 MCG/5ML IJ SOLN
INTRAMUSCULAR | Status: AC
  Filled 2019-06-27: qty 5

## 2019-06-27 MED ORDER — BUPIVACAINE HCL 0.25 % IJ SOLN
INTRAMUSCULAR | Status: AC
  Filled 2019-06-27: qty 1

## 2019-06-27 MED ORDER — ACETAMINOPHEN 500 MG PO TABS
1000.0000 mg | ORAL_TABLET | ORAL | Status: AC
  Administered 2019-06-27: 1000 mg via ORAL
  Filled 2019-06-27: qty 2

## 2019-06-27 MED ORDER — ONDANSETRON HCL 4 MG/2ML IJ SOLN
INTRAMUSCULAR | Status: DC | PRN
  Administered 2019-06-27 (×2): 4 mg via INTRAVENOUS

## 2019-06-27 MED ORDER — BUPIVACAINE HCL (PF) 0.25 % IJ SOLN
INTRAMUSCULAR | Status: DC | PRN
  Administered 2019-06-27: 30 mL

## 2019-06-27 MED ORDER — ALUM & MAG HYDROXIDE-SIMETH 200-200-20 MG/5ML PO SUSP: 30.0000 mL | Freq: Four times a day (QID) | ORAL | Status: DC | PRN

## 2019-06-27 MED ORDER — GABAPENTIN 300 MG PO CAPS
300.0000 mg | ORAL_CAPSULE | Freq: Two times a day (BID) | ORAL | Status: DC
  Administered 2019-06-27 – 2019-07-02 (×10): 300 mg via ORAL
  Filled 2019-06-27 (×11): qty 1

## 2019-06-27 MED ORDER — LIDOCAINE 2% (20 MG/ML) 5 ML SYRINGE
INTRAMUSCULAR | Status: DC | PRN
  Administered 2019-06-27: 1.5 mg/kg/h via INTRAVENOUS

## 2019-06-27 SURGICAL SUPPLY — 97 items
BLADE EXTENDED COATED 6.5IN (ELECTRODE) IMPLANT
CANNULA REDUC XI 12-8 STAPL (CANNULA) ×2
CANNULA REDUC XI 12-8MM STAPL (CANNULA) ×1
CANNULA REDUCER 12-8 DVNC XI (CANNULA) ×1 IMPLANT
CELLS DAT CNTRL 66122 CELL SVR (MISCELLANEOUS) IMPLANT
CLIP VESOLOCK LG 6/CT PURPLE (CLIP) IMPLANT
CLIP VESOLOCK MED 6/CT (CLIP) IMPLANT
COVER SURGICAL LIGHT HANDLE (MISCELLANEOUS) ×6 IMPLANT
COVER TIP SHEARS 8 DVNC (MISCELLANEOUS) ×1 IMPLANT
COVER TIP SHEARS 8MM DA VINCI (MISCELLANEOUS) ×3
COVER WAND RF STERILE (DRAPES) ×3 IMPLANT
DECANTER SPIKE VIAL GLASS SM (MISCELLANEOUS) IMPLANT
DERMABOND ADVANCED (GAUZE/BANDAGES/DRESSINGS) ×2
DERMABOND ADVANCED .7 DNX12 (GAUZE/BANDAGES/DRESSINGS) IMPLANT
DRAIN CHANNEL 19F RND (DRAIN) IMPLANT
DRAPE ARM DVNC X/XI (DISPOSABLE) ×4 IMPLANT
DRAPE COLUMN DVNC XI (DISPOSABLE) ×1 IMPLANT
DRAPE DA VINCI XI ARM (DISPOSABLE) ×12
DRAPE DA VINCI XI COLUMN (DISPOSABLE) ×3
DRAPE SURG IRRIG POUCH 19X23 (DRAPES) ×3 IMPLANT
DRSG OPSITE POSTOP 4X10 (GAUZE/BANDAGES/DRESSINGS) IMPLANT
DRSG OPSITE POSTOP 4X6 (GAUZE/BANDAGES/DRESSINGS) ×2 IMPLANT
DRSG OPSITE POSTOP 4X8 (GAUZE/BANDAGES/DRESSINGS) IMPLANT
ELECT REM PT RETURN 15FT ADLT (MISCELLANEOUS) ×3 IMPLANT
ENDOLOOP SUT PDS II  0 18 (SUTURE)
ENDOLOOP SUT PDS II 0 18 (SUTURE) IMPLANT
EVACUATOR SILICONE 100CC (DRAIN) IMPLANT
GAUZE SPONGE 4X4 12PLY STRL (GAUZE/BANDAGES/DRESSINGS) IMPLANT
GLOVE BIO SURGEON STRL SZ 6.5 (GLOVE) ×6 IMPLANT
GLOVE BIO SURGEONS STRL SZ 6.5 (GLOVE) ×3
GLOVE BIOGEL PI IND STRL 7.0 (GLOVE) ×3 IMPLANT
GLOVE BIOGEL PI INDICATOR 7.0 (GLOVE) ×6
GOWN STRL REUS W/TWL XL LVL3 (GOWN DISPOSABLE) ×21 IMPLANT
GRASPER ENDOPATH ANVIL 10MM (MISCELLANEOUS) IMPLANT
GRASPER SUT TROCAR 14GX15 (MISCELLANEOUS) IMPLANT
HOLDER FOLEY CATH W/STRAP (MISCELLANEOUS) ×3 IMPLANT
IRRIG SUCT STRYKERFLOW 2 WTIP (MISCELLANEOUS) ×3
IRRIGATION SUCT STRKRFLW 2 WTP (MISCELLANEOUS) ×1 IMPLANT
IRRIGATOR SUCT 8 DISP DVNC XI (IRRIGATION / IRRIGATOR) IMPLANT
IRRIGATOR SUCTION 8MM XI DISP (IRRIGATION / IRRIGATOR)
KIT PROCEDURE DA VINCI SI (MISCELLANEOUS)
KIT PROCEDURE DVNC SI (MISCELLANEOUS) IMPLANT
KIT TURNOVER KIT A (KITS) IMPLANT
NDL INSUFFLATION 14GA 120MM (NEEDLE) ×1 IMPLANT
NEEDLE INSUFFLATION 14GA 120MM (NEEDLE) ×3 IMPLANT
PACK CARDIOVASCULAR III (CUSTOM PROCEDURE TRAY) ×3 IMPLANT
PACK COLON (CUSTOM PROCEDURE TRAY) ×3 IMPLANT
PENCIL SMOKE EVACUATOR (MISCELLANEOUS) IMPLANT
PORT LAP GEL ALEXIS MED 5-9CM (MISCELLANEOUS) ×3 IMPLANT
RELOAD STAPLE 45 BLU REG DVNC (STAPLE) IMPLANT
RELOAD STAPLE 45 GRN THCK DVNC (STAPLE) IMPLANT
RETRACTOR WND ALEXIS 18 MED (MISCELLANEOUS) IMPLANT
RTRCTR WOUND ALEXIS 18CM MED (MISCELLANEOUS)
SCISSORS LAP 5X35 DISP (ENDOMECHANICALS) ×3 IMPLANT
SEAL CANN UNIV 5-8 DVNC XI (MISCELLANEOUS) ×3 IMPLANT
SEAL XI 5MM-8MM UNIVERSAL (MISCELLANEOUS) ×9
SEALER VESSEL DA VINCI XI (MISCELLANEOUS) ×3
SEALER VESSEL EXT DVNC XI (MISCELLANEOUS) ×1 IMPLANT
SLEEVE ADV FIXATION 5X100MM (TROCAR) IMPLANT
SOLUTION ELECTROLUBE (MISCELLANEOUS) ×3 IMPLANT
STAPLER 45 BLU RELOAD XI (STAPLE) IMPLANT
STAPLER 45 BLUE RELOAD XI (STAPLE)
STAPLER 45 GREEN RELOAD XI (STAPLE)
STAPLER 45 GRN RELOAD XI (STAPLE) IMPLANT
STAPLER CANNULA SEAL DVNC XI (STAPLE) ×1 IMPLANT
STAPLER CANNULA SEAL XI (STAPLE) ×3
STAPLER SHEATH (SHEATH) ×3
STAPLER SHEATH ENDOWRIST DVNC (SHEATH) ×1 IMPLANT
STAPLER VISISTAT 35W (STAPLE) IMPLANT
STOPCOCK 4 WAY LG BORE MALE ST (IV SETS) ×6 IMPLANT
SUT ETHILON 2 0 PS N (SUTURE) IMPLANT
SUT NOVA NAB DX-16 0-1 5-0 T12 (SUTURE) ×6 IMPLANT
SUT PROLENE 2 0 KS (SUTURE) ×3 IMPLANT
SUT SILK 2 0 (SUTURE) ×3
SUT SILK 2 0 SH CR/8 (SUTURE) IMPLANT
SUT SILK 2-0 18XBRD TIE 12 (SUTURE) ×1 IMPLANT
SUT SILK 3 0 (SUTURE)
SUT SILK 3 0 SH CR/8 (SUTURE) ×3 IMPLANT
SUT SILK 3-0 18XBRD TIE 12 (SUTURE) IMPLANT
SUT V-LOC BARB 180 2/0GR6 GS22 (SUTURE)
SUT VIC AB 2-0 SH 18 (SUTURE) IMPLANT
SUT VIC AB 2-0 SH 27 (SUTURE) ×3
SUT VIC AB 2-0 SH 27X BRD (SUTURE) ×1 IMPLANT
SUT VIC AB 3-0 SH 18 (SUTURE) IMPLANT
SUT VIC AB 4-0 PS2 27 (SUTURE) ×6 IMPLANT
SUT VICRYL 0 UR6 27IN ABS (SUTURE) ×3 IMPLANT
SUTURE V-LC BRB 180 2/0GR6GS22 (SUTURE) IMPLANT
SYR 10ML ECCENTRIC (SYRINGE) ×3 IMPLANT
SYS LAPSCP GELPORT 120MM (MISCELLANEOUS)
SYSTEM LAPSCP GELPORT 120MM (MISCELLANEOUS) IMPLANT
TOWEL OR 17X26 10 PK STRL BLUE (TOWEL DISPOSABLE) IMPLANT
TOWEL OR NON WOVEN STRL DISP B (DISPOSABLE) ×3 IMPLANT
TRAY FOLEY MTR SLVR 16FR STAT (SET/KITS/TRAYS/PACK) ×3 IMPLANT
TROCAR ADV FIXATION 5X100MM (TROCAR) ×3 IMPLANT
TUBING CONNECTING 10 (TUBING) ×4 IMPLANT
TUBING CONNECTING 10' (TUBING) ×2
TUBING INSUFFLATION 10FT LAP (TUBING) ×3 IMPLANT

## 2019-06-27 NOTE — Anesthesia Postprocedure Evaluation (Signed)
Anesthesia Post Note  Patient: [name]  Procedure(s) Performed: ROBOTIC ASSISTED SUBTOTAL COLECTOMY WITH ILEOSIGMOID ANASTOMOSIS (N/A Abdomen)     Patient location during evaluation: PACU Anesthesia Type: General Level of consciousness: awake and alert Pain management: pain level controlled Vital Signs Assessment: post-procedure vital signs reviewed and stable Respiratory status: spontaneous breathing, nonlabored ventilation and respiratory function stable Cardiovascular status: blood pressure returned to baseline and stable Postop Assessment: no apparent nausea or vomiting Anesthetic complications: no    Last Vitals:  Vitals:   06/27/19 1715 06/27/19 1730  BP: (!) 136/93 (!) 134/92  Pulse: 92 96  Resp: 11 12  Temp:  36.4 C  SpO2: 100% 100%    Last Pain:  Vitals:   06/27/19 1730  TempSrc:   PainSc: 6                  Lidia Collum

## 2019-06-27 NOTE — H&P (Signed)
The patient is a 44 year old female who presents with Crohn's disease. 44 year old female with a 10 year history of Crohn's disease who presents to the office today with worsening obstructive symptoms. She was seen by her gastrologist. After CT scan showed a mass in the transverse colon as well as some terminal ileitis and sigmoid stricture. Dr. Carlean Purl was unable to advance the scope past the sigmoid stricture. This patient's case was discussed in our multidisciplinary inflammatory bowel disease conference and it was thought that a surgical resection would be her best chance at symptom relief as well as evaluating her transverse colon mass. She is having occasional vomiting. No weight loss has been noted.   Past Surgical History  Oral Surgery  Diagnostic Studies History Colonoscopy within last year Mammogram within last year Pap Smear 1-5 years ago  Allergies  No Known Drug Allergies [05/12/2019]: Allergies Reconciled  Medication History  Mesalamine (1.2GM Tablet DR, Oral) Active. Multi Vitamin (Oral) Active. Medications Reconciled  Social History  Alcohol use Recently quit alcohol use. Caffeine use Carbonated beverages, Coffee, Tea. No drug use Tobacco use Former smoker.  Family History  Diabetes Mellitus Father.  Pregnancy / Birth History  Age at menarche 37 years. Gravida 4 Irregular periods Length (months) of breastfeeding 3-6 Maternal age 71-20 Para 1  Other Problems  Crohn's Disease     Review of Systems General Not Present- Appetite Loss, Chills, Fatigue, Fever, Night Sweats, Weight Gain and Weight Loss. Skin Not Present- Change in Wart/Mole, Dryness, Hives, Jaundice, New Lesions, Non-Healing Wounds, Rash and Ulcer. HEENT Not Present- Earache, Hearing Loss, Hoarseness, Nose Bleed, Oral Ulcers, Ringing in the Ears, Seasonal Allergies, Sinus Pain, Sore Throat, Visual Disturbances, Wears glasses/contact lenses and Yellow  Eyes. Respiratory Not Present- Bloody sputum, Chronic Cough, Difficulty Breathing, Snoring and Wheezing. Breast Not Present- Breast Mass, Breast Pain, Nipple Discharge and Skin Changes. Cardiovascular Not Present- Chest Pain, Difficulty Breathing Lying Down, Leg Cramps, Palpitations, Rapid Heart Rate, Shortness of Breath and Swelling of Extremities. Gastrointestinal Not Present- Abdominal Pain, Bloating, Bloody Stool, Change in Bowel Habits, Chronic diarrhea, Constipation, Difficulty Swallowing, Excessive gas, Gets full quickly at meals, Hemorrhoids, Indigestion, Nausea, Rectal Pain and Vomiting. Female Genitourinary Not Present- Frequency, Nocturia, Painful Urination, Pelvic Pain and Urgency. Musculoskeletal Not Present- Back Pain, Joint Pain, Joint Stiffness, Muscle Pain, Muscle Weakness and Swelling of Extremities. Neurological Not Present- Decreased Memory, Fainting, Headaches, Numbness, Seizures, Tingling, Tremor, Trouble walking and Weakness. Psychiatric Not Present- Anxiety, Bipolar, Change in Sleep Pattern, Depression, Fearful and Frequent crying. Endocrine Not Present- Cold Intolerance, Excessive Hunger, Hair Changes, Heat Intolerance, Hot flashes and New Diabetes. Hematology Not Present- Blood Thinners, Easy Bruising, Excessive bleeding, Gland problems, HIV and Persistent Infections.  BP (!) 141/101   Pulse (!) 124   Temp 98.8 F (37.1 C) (Oral)   Resp 16   Ht 5' 5"  (1.651 m)   Wt 65.5 kg   LMP 06/09/2019   SpO2 99%   BMI 24.03 kg/m     Physical Exam   General Mental Status-Alert. General Appearance-Not in acute distress. Build & Nutrition-Well nourished. Posture-Normal posture. Gait-Normal.  Head and Neck Head-normocephalic, atraumatic with no lesions or palpable masses. Trachea-midline. Thyroid Gland Characteristics - normal size and consistency and no palpable nodules.  Chest and Lung Exam Chest and lung exam reveals -on auscultation, normal  breath sounds, no adventitious sounds and normal vocal resonance.  Cardiovascular Cardiovascular examination reveals -normal heart sounds, regular rate and rhythm with no murmurs and no digital clubbing, cyanosis,  edema, increased warmth or tenderness.  Abdomen Inspection Inspection of the abdomen reveals - No Hernias. Palpation/Percussion Palpation and Percussion of the abdomen reveal - Soft, Non Tender, No Rigidity (guarding), No hepatosplenomegaly and No Palpable abdominal masses.  Neurologic Neurologic evaluation reveals -alert and oriented x 3 with no impairment of recent or remote memory, normal attention span and ability to concentrate, normal sensation and normal coordination.  Musculoskeletal Normal Exam - Bilateral-Upper Extremity Strength Normal and Lower Extremity Strength Normal.    Assessment & Plan  CROHN'S COLITIS, WITH INTESTINAL OBSTRUCTION (K50.112) Impression: 44 year old female with continued history of Crohn's disease who presents to the office today for evaluation of an increasingly symptomatic sigmoid stricture as well as a transverse colon mass. She underwent a CT scan in December 2020 which showed a partial obstruction of the transverse colon associated with significant nodularity and masslike effect with robust inflammatory changes as well as a sigmoid stricture and terminal ileum inflammation. The right colon is significantly dilated. She is currently on mesalamine. I have recommended that we perform a total abdominal colectomy with ileorectal or ileosigmoid anastomosis. We have discussed this in detail including risk of postoperative bleeding, intraoperative bleeding and anastomotic dehiscence.  The surgery and anatomy were described to the patient as well as the risks of surgery and the possible complications.  These include: Bleeding, deep abdominal infections and possible wound complications such as hernia and infection, damage to adjacent  structures, leak of surgical connections, which can lead to other surgeries and possibly an ostomy, possible need for other procedures, such as abscess drains in radiology, possible prolonged hospital stay, possible diarrhea from removal of part of the colon, possible constipation from narcotics, prolonged fatigue/weakness or appetite loss, possible early recurrence of of disease, possible complications of their medical problems such as heart disease or arrhythmias or lung problems, death (less than 1%). I believe the patient understands and wishes to proceed with the surgery.

## 2019-06-27 NOTE — Op Note (Signed)
06/27/2019  4:25 PM  PATIENT:  [name]  44 y.o. female  Patient Care Team: Patient, [name] as PCP - General (General Practice)  PRE-OPERATIVE DIAGNOSIS:  CHRONS STRICTURE, COLON MASS  POST-OPERATIVE DIAGNOSIS:  CHRONS STRICTURE, COLON MASS  PROCEDURE:  ROBOTIC ASSISTED SUBTOTAL COLECTOMY WITH ILEO-SIGMOID ANASTOMOSIS   Surgeon(s): [name], MD Clovis Riley, MD Michael Boston, MD  ASSISTANT: Dr Johney Maine   ANESTHESIA:   local and general  EBL: 385m  Total I/O In: 4000 [I.V.:3400; IV [id number]]Out: 500 [Urine:200; Blood:300]  Delay start of Pharmacological VTE agent (>24hrs) due to surgical blood loss or risk of bleeding:  no  DRAINS: none   SPECIMEN:  Source of Specimen:  Subtotal colectomy  DISPOSITION OF SPECIMEN:  PATHOLOGY  COUNTS:  YES  PLAN OF CARE: Admit to inpatient   PATIENT DISPOSITION:  PACU - hemodynamically stable.  INDICATION:    44y.o. F with multiple colonic strictures due to Crohn's disease.  I recommended segmental resection:  The anatomy & physiology of the digestive tract was discussed.  The pathophysiology was discussed.  Natural history risks without surgery was discussed.   I worked to give an overview of the disease and the frequent need to have multispecialty involvement.  I feel the risks of no intervention will lead to serious problems that outweigh the operative risks; therefore, I recommended a partial colectomy to remove the pathology.  Laparoscopic & open techniques were discussed.   Risks such as bleeding, infection, abscess, leak, reoperation, possible ostomy, hernia, heart attack, death, and other risks were discussed.  I noted a good likelihood this will help address the problem.   Goals of post-operative recovery were discussed as well.    The patient expressed understanding & wished to proceed with surgery.  OR FINDINGS:   Patient had significant inflammatory changes in the mid transverse colon.  No  obvious metastatic disease on visceral parietal peritoneum or liver.  The anastomosis rests ~20 cm from the anal verge by rigid proctoscopy.  DESCRIPTION:   Informed consent was confirmed.  The patient underwent general anaesthesia without difficulty.  The patient was positioned appropriately.  VTE prevention in place.  The patient's abdomen was clipped, prepped, & draped in a sterile fashion.  Surgical timeout confirmed our plan.  The patient was positioned in reverse Trendelenburg.  Abdominal entry was gained using a Varies needle in the LUQ.  Entry was clean.  I induced carbon dioxide insufflation.  An 840mrobotic port was placed in the LLQ.  Camera inspection revealed no injury.  Extra ports were carefully placed under direct laparoscopic visualization. The patient was appropriately positioned and the robot was docked to the patient's left side.  Instruments were placed under direct visualization.    I began by mobilizing the right colon.  I identified the ileocolic artery.  I entered into the retroperitoneal plane underneath of the artery and identified the duodenum.  Dissected out the ileocolic artery and vein.  This was divided under a robotic vessel sealer.  Unfortunately there was bleeding in the proximal end.  I was unable to get good exposure to identified the stump of the artery.  I decided to undocked the robot and make a small midline laparotomy.  I packed off the small bowel and identified the bleeding vessel.  This was clamped with a right angle and suture ligated.  Once this was complete abdomen was irrigated.  Hemostasis was good.  I placed an [place] protector inside the  incision and covered this with a green cap.  We then redocked the robot and continued our dissection.  I mobilized the right colon from the right side of the abdominal wall using the robotic vessel sealer.  Blunt dissection was used to dissect the remaining retroperitoneal plane.  I then continued with the  hepatocolic ligament and divided this using the vessel sealer.  I continued to divide the mesentery from the terminal ileum up towards the transverse colon using the robotic vessel sealer.  The duodenum and pancreas were identified and carefully dissected free from the mesenteric structures.  I was able to identify the middle colic artery and vein.  There was significant lymphadenopathy noted.  I transected the middle colic artery and vein approximately 2 cm above the takeoff.  There was good hemostasis.  I then divided the omentum off of the transverse colon using the vessel sealer.  I continued into the lesser sac dividing the gastrocolic and splenocolic ligaments.  We then repositioned the patient and I took down the splenic flexure using the robotic vessel sealer.  I continued to divide the mesentery also using the vessel sealer.  The omentum was preserved on the left side.  I then took down the descending colon attachments.  At this point we needed to identify the sigmoid stricture.  I clipped the proximal descending colon and performed an on table colonoscopy.  The sigmoid stricture was noted to be approximately 25 cm.  I marked an area distal to this and I continued by dissection.  I developed the retroperitoneal plane from lateral to medial to free up the remaining attachments of the descending colon.  I finished dividing the mesentery as well using the robotic vessel sealer.  This allowed for the entire colon to be Norris.  We then placed a 60 mm green load stapler into the abdomen.  I identified my margin in the terminal ileum and transected this using the stapler.  I also transected the sigmoid colon in similar fashion.  Once this was complete, we reevaluated the sigmoid colon and felt that we did not have enough distal margin.  An additional 10 cm was identified and the bowel was skeletonized from the mesentery using the robotic vessel sealer.  I then transected the sigmoid colon again using a green load  robotic stapler.  Once this was complete a 60 mm robotic stapler was used to create a anastomosis between the terminal ileum and the rectosigmoid area.  This was done in an isoperistaltic fashion.  The common enterotomy was sewn closed robotically using 2, 2-0 V-Loc sutures.  Once this was complete, the abdomen was irrigated.  Hemostasis was good.  The colonoscope was reinserted into the colon and the anastomosis was evaluated.  There was no leak with insufflation.  Hemostasis was good.  The ports and instruments were removed.  We switched to clean gowns, gloves, instruments and drapes.  The fascia of the midline incision was then closed using interrupted #1 Novafil sutures.  Subcutaneous tissue was reapproximated using a 2-0 Vicryl running suture.  The skin was closed using a running 4-0 Vicryl subcuticular suture.  The remaining port sites were also closed using 4-0 Vicryl subcuticular sutures.  The 12 mm suprapubic port fascia was closed using a 0 Vicryl suture.  The skin was closed with a 4-0 Vicryl interrupted suture.  Dermabond was placed on the port sites and a sterile dressing was placed on the midline.  The patient was then awakened from anesthesia and sent  to the postanesthesia care unit in stable condition.  All counts were correct per operating room staff.

## 2019-06-27 NOTE — Anesthesia Preprocedure Evaluation (Addendum)
Anesthesia Evaluation  Patient identified by MRN, date of birth, ID band Patient awake    Reviewed: Allergy & Precautions, NPO status , Patient's Chart, lab work & pertinent test results  Airway Mallampati: I  TM Distance: >3 FB Neck ROM: Full    Dental no notable dental hx. (+) Teeth Intact, Dental Advisory Given   Pulmonary neg pulmonary ROS, former smoker,    Pulmonary exam normal breath sounds clear to auscultation       Cardiovascular negative cardio ROS Normal cardiovascular exam Rhythm:Regular Rate:Normal     Neuro/Psych negative neurological ROS  negative psych ROS   GI/Hepatic negative GI ROS, Neg liver ROS,   Endo/Other  negative endocrine ROS  Renal/GU negative Renal ROS  negative genitourinary   Musculoskeletal negative musculoskeletal ROS (+)   Abdominal   Peds  Hematology  (+) Blood dyscrasia (Hgb 10.5), anemia , REFUSES BLOOD PRODUCTS (will accept albumin ),   Anesthesia Other Findings Colon mass, crohn's disease   Reproductive/Obstetrics                            Anesthesia Physical Anesthesia Plan  ASA: II  Anesthesia Plan: General   Post-op Pain Management:    Induction: Intravenous  PONV Risk Score and Plan: 3 and Midazolam, Dexamethasone and Ondansetron  Airway Management Planned: Oral ETT  Additional Equipment:   Intra-op Plan:   Post-operative Plan: Extubation in OR  Informed Consent: I have reviewed the patients History and Physical, chart, labs and discussed the procedure including the risks, benefits and alternatives for the proposed anesthesia with the patient or authorized representative who has indicated his/her understanding and acceptance.     Dental advisory given  Plan Discussed with: CRNA  Anesthesia Plan Comments:         Anesthesia Quick Evaluation

## 2019-06-27 NOTE — Transfer of Care (Signed)
Immediate Anesthesia Transfer of Care Note  Patient: [name]  Procedure(s) Performed: ROBOTIC ASSISTED SUBTOTAL COLECTOMY WITH ILEOSIGMOID ANASTOMOSIS (N/A Abdomen)  Patient Location: PACU  Anesthesia Type:General  Level of Consciousness: drowsy and patient cooperative  Airway & Oxygen Therapy: Patient Spontanous Breathing and Patient connected to face mask oxygen  Post-op Assessment: Report given to RN and Post -op Vital signs reviewed and stable  Post vital signs: Reviewed and stable  Last Vitals:  Vitals Value Taken Time  BP 135/96 06/27/19 1639  Temp    Pulse 93 06/27/19 1642  Resp 20 06/27/19 1642  SpO2 100 % 06/27/19 1642  Vitals shown include unvalidated device data.  Last Pain:  Vitals:   06/27/19 1057  TempSrc: Oral         Complications: No apparent anesthesia complications

## 2019-06-27 NOTE — Anesthesia Procedure Notes (Addendum)
Procedure Name: Intubation Date/Time: 06/27/2019 12:04 PM Performed by: Montel Clock, CRNA Pre-anesthesia Checklist: Patient identified, Emergency Drugs available, Suction available, Patient being monitored and Timeout performed Patient Re-evaluated:Patient Re-evaluated prior to induction Oxygen Delivery Method: Circle system utilized Preoxygenation: Pre-oxygenation with 100% oxygen Induction Type: IV induction Ventilation: Mask ventilation without difficulty Laryngoscope Size: Mac and 3 Grade View: Grade I Tube type: Oral Tube size: 7.0 mm Number of attempts: 1 Airway Equipment and Method: Stylet Placement Confirmation: ETT inserted through vocal cords under direct vision,  positive ETCO2 and breath sounds checked- equal and bilateral Secured at: 21 cm Tube secured with: Tape Dental Injury: Teeth and Oropharynx as per pre-operative assessment

## 2019-06-28 LAB — BASIC METABOLIC PANEL
Anion gap: 10 (ref 5–15)
BUN: 10 mg/dL (ref 6–20)
CO2: 19 mmol/L — ABNORMAL LOW (ref 22–32)
Calcium: 7.3 mg/dL — ABNORMAL LOW (ref 8.9–10.3)
Chloride: 107 mmol/L (ref 98–111)
Creatinine, Ser: 0.91 mg/dL (ref 0.44–1.00)
GFR calc Af Amer: >60 mL/min (ref >60)
GFR calc non Af Amer: >60 mL/min (ref >60)
Glucose, Bld: 156 mg/dL — ABNORMAL HIGH (ref 70–99)
Potassium: 4 mmol/L (ref 3.5–5.1)
Sodium: 136 mmol/L (ref 135–145)

## 2019-06-28 LAB — CBC
HCT: 29.7 % — ABNORMAL LOW (ref 36.0–46.0)
Hemoglobin: 9 g/dL — ABNORMAL LOW (ref 12.0–15.0)
MCH: 24.2 pg — ABNORMAL LOW (ref 26.0–34.0)
MCHC: 30.3 g/dL (ref 30.0–36.0)
MCV: 79.8 fL — ABNORMAL LOW (ref 80.0–100.0)
Platelets: 289 10*3/uL (ref 150–400)
RBC: 3.72 MIL/uL — ABNORMAL LOW (ref 3.87–5.11)
RDW: 18.6 % — ABNORMAL HIGH (ref 11.5–15.5)
WBC: 11.9 10*3/uL — ABNORMAL HIGH (ref 4.0–10.5)
nRBC: 0 % (ref 0.0–0.2)

## 2019-06-28 LAB — NO BLOOD PRODUCTS

## 2019-06-28 MED ORDER — ACETAMINOPHEN 325 MG PO TABS
650.0000 mg | ORAL_TABLET | ORAL | Status: DC | PRN
  Administered 2019-06-28: 650 mg via ORAL
  Filled 2019-06-28: qty 2

## 2019-06-28 MED ORDER — CHLORHEXIDINE GLUCONATE CLOTH 2 % EX PADS
6.0000 | MEDICATED_PAD | Freq: Every day | CUTANEOUS | Status: DC
  Administered 2019-06-28 – 2019-06-30 (×2): 6 via TOPICAL

## 2019-06-28 NOTE — Progress Notes (Signed)
Vital signs this evening score pt a red mews for elevated HR and temp of 100.9, MD Tsuei (on-call for surgery) notified per protocol, gave order for tylenol and deferred to Dr. Manon Hilding note regarding HR, pt has been walking, doing IS, pain this evening was minimal but goes to a 7/10 with movement, given Ultram per [facility] order, surgical port sites and midline incision look clean and dry, no redness or drainage noted, pt is tolerating a full liquid diet, hypo BSx4, no flatus but burping, no BM post op yet, will implement new red mews guidelines with vitals at 11p, 12a, 1a, and 2a, and then q4 hrs, Since physician aware he states no need to renotify for same vital sign abnormalities.

## 2019-06-28 NOTE — Progress Notes (Signed)
1 Day Post-Op robotic subtotal colectomy with IRA Subjective: Having a lot of pain today.  No flatus.  No nausea.  Tolerating clears.  Good UOP  Objective: Vital signs in last 24 hours: Temp:  [97.5 F (36.4 C)-99.7 F (37.6 C)] 99.6 F (37.6 C) (03/20 0532) Pulse Rate:  [91-125] 125 (03/20 0532) Resp:  [11-22] 16 (03/20 0532) BP: (120-141)/(80-101) 140/89 (03/20 0532) SpO2:  [99 %-100 %] 99 % (03/20 0532) Weight:  [65.5 kg] 65.5 kg (03/19 1050)   Intake/Output from previous day: 03/19 0701 - 03/20 0700 In: 5585 [P.O.:200; I.V.:4685; IV Piggyback:700] Out: 1125 [Urine:825; Blood:300] Intake/Output this shift: No intake/output data recorded.   General appearance: alert and cooperative GI: normal findings: soft, appropriately tender, mildly distended  Incision: no significant drainage  Lab Results:  Recent Labs    06/28/19 0404  WBC 11.9*  HGB 9.0*  HCT 29.7*  PLT 289   BMET Recent Labs    06/28/19 0404  NA 136  K 4.0  CL 107  CO2 19*  GLUCOSE 156*  BUN 10  CREATININE 0.91  CALCIUM 7.3*   PT/INR No results for input(s): LABPROT, INR in the last 72 hours. ABG No results for input(s): PHART, HCO3 in the last 72 hours.  Invalid input(s): PCO2, PO2  MEDS, Scheduled . alvimopan  12 mg Oral BID  . enoxaparin (LOVENOX) injection  40 mg Subcutaneous Q24H  . feeding supplement  237 mL Oral BID BM  . gabapentin  300 mg Oral BID  . mesalamine  2.4 g Oral BID  . saccharomyces boulardii  250 mg Oral BID    Studies/Results: No results found.  Assessment: s/p Procedure(s): ROBOTIC ASSISTED SUBTOTAL COLECTOMY WITH ILEOSIGMOID ANASTOMOSIS Patient Active Problem List   Diagnosis Date Noted  . Crohn's colitis, with intestinal obstruction ([place]) 06/27/2019  . Mass of colon 05/07/2019  . Abnormal finding present on diagnostic imaging of uterus 05/07/2019  . Colon stricture ([place]) 05/07/2019  . Vitamin D deficiency 09/24/2013  . Crohn's disease of small and  large intestines with complication ([place]) 54/00/8676  . 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 10/17/2010    Expected post op course.  Pt with tachycardia, which is very common in post op Crohn's patients.  No signs of dehydration or severe anemia.  Pain also likely contributing.  Plan: cont liquid diet  OOB to chair at least today Cont IVF and foley   LOS: 1 day     .Rosario Adie, MD [facility], [place]    06/28/2019 7:49 AM

## 2019-06-28 NOTE — Progress Notes (Signed)
Pt's HR this morning 122-126 sustained . Paged oncall MD [name]. Administered PRN tramadol and IV dilaudid for pain. Continue to monitor.

## 2019-06-29 LAB — BASIC METABOLIC PANEL
Anion gap: 7 (ref 5–15)
BUN: 6 mg/dL (ref 6–20)
CO2: 24 mmol/L (ref 22–32)
Calcium: 7.4 mg/dL — ABNORMAL LOW (ref 8.9–10.3)
Chloride: 103 mmol/L (ref 98–111)
Creatinine, Ser: 0.8 mg/dL (ref 0.44–1.00)
GFR calc Af Amer: >60 mL/min (ref >60)
GFR calc non Af Amer: >60 mL/min (ref >60)
Glucose, Bld: 129 mg/dL — ABNORMAL HIGH (ref 70–99)
Potassium: 3.4 mmol/L — ABNORMAL LOW (ref 3.5–5.1)
Sodium: 134 mmol/L — ABNORMAL LOW (ref 135–145)

## 2019-06-29 LAB — CBC
HCT: 25.2 % — ABNORMAL LOW (ref 36.0–46.0)
Hemoglobin: 7.7 g/dL — ABNORMAL LOW (ref 12.0–15.0)
MCH: 23.9 pg — ABNORMAL LOW (ref 26.0–34.0)
MCHC: 30.6 g/dL (ref 30.0–36.0)
MCV: 78.3 fL — ABNORMAL LOW (ref 80.0–100.0)
Platelets: 249 10*3/uL (ref 150–400)
RBC: 3.22 MIL/uL — ABNORMAL LOW (ref 3.87–5.11)
RDW: 18.2 % — ABNORMAL HIGH (ref 11.5–15.5)
WBC: 18.8 10*3/uL — ABNORMAL HIGH (ref 4.0–10.5)
nRBC: 0 % (ref 0.0–0.2)

## 2019-06-29 MED ORDER — ACETAMINOPHEN 500 MG PO TABS
1000.0000 mg | ORAL_TABLET | Freq: Four times a day (QID) | ORAL | Status: DC
  Administered 2019-06-29 – 2019-07-01 (×8): 1000 mg via ORAL
  Filled 2019-06-29 (×9): qty 2

## 2019-06-29 NOTE — Progress Notes (Signed)
Red mews q1 hour vitals completed, temp down to 100.8 and heart rate down to 127- pt now back in the yellow mews, will continue q4 hour vitals and monitor for changes.

## 2019-06-29 NOTE — Progress Notes (Signed)
2 Days Post-Op robotic subtotal colectomy with IRA Subjective: Had some fevers yesterday.  Good UOP.  Had a BM.  No nausea  Objective: Vital signs in last 24 hours: Temp:  [98.6 F (37 C)-102.3 F (39.1 C)] 99.7 F (37.6 C) (03/21 0557) Pulse Rate:  [127-150] 130 (03/21 0557) Resp:  [16-18] 18 (03/21 0557) BP: (120-138)/(81-92) 133/84 (03/21 0557) SpO2:  [99 %-100 %] 99 % (03/21 0557)   Intake/Output from previous day: 03/20 0701 - 03/21 0700 In: 2785.7 [P.O.:1140; I.V.:1645.7] Out: 6433 [Urine:3275] Intake/Output this shift: No intake/output data recorded.   General appearance: alert and cooperative GI: normal findings: soft, appropriately tender, mildly distended  Incision: no significant drainage  Lab Results:  Recent Labs    06/28/19 0404 06/29/19 0336  WBC 11.9* 18.8*  HGB 9.0* 7.7*  HCT 29.7* 25.2*  PLT 289 249   BMET Recent Labs    06/28/19 0404 06/29/19 0336  NA 136 134*  K 4.0 3.4*  CL 107 103  CO2 19* 24  GLUCOSE 156* 129*  BUN 10 6  CREATININE 0.91 0.80  CALCIUM 7.3* 7.4*   PT/INR No results for input(s): LABPROT, INR in the last 72 hours. ABG No results for input(s): PHART, HCO3 in the last 72 hours.  Invalid input(s): PCO2, PO2  MEDS, Scheduled . acetaminophen  1,000 mg Oral Q6H  . alvimopan  12 mg Oral BID  . Chlorhexidine Gluconate Cloth  6 each Topical Daily  . enoxaparin (LOVENOX) injection  40 mg Subcutaneous Q24H  . feeding supplement  237 mL Oral BID BM  . gabapentin  300 mg Oral BID  . mesalamine  2.4 g Oral BID  . saccharomyces boulardii  250 mg Oral BID    Studies/Results: No results found.  Assessment: s/p Procedure(s): ROBOTIC ASSISTED SUBTOTAL COLECTOMY WITH ILEOSIGMOID ANASTOMOSIS Patient Active Problem List   Diagnosis Date Noted  . Crohn's colitis, with intestinal obstruction ([place]) 06/27/2019  . Mass of colon 05/07/2019  . Abnormal finding present on diagnostic imaging of uterus 05/07/2019  . Colon  stricture ([place]) 05/07/2019  . Vitamin D deficiency 09/24/2013  . Crohn's disease of small and large intestines with complication ([place]) 29/51/8841  . 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 10/17/2010    Expected post op course.  Pt with tachycardia, which is very common in post op Crohn's patients.  No signs of dehydration or severe anemia.  Pain and fevers also likely contributing.  Plan: Advance to reg diet OOB to chair at least today Decrease IVF  D/c foley   LOS: 2 days     .Rosario Adie, [place] [facility], [place]    06/29/2019 7:56 AM

## 2019-06-29 NOTE — Plan of Care (Signed)

## 2019-06-29 NOTE — Progress Notes (Signed)
Pharmacy Brief Note - Alvimopan (Entereg)  The standing order set for alvimopan (Entereg) now includes an automatic order to discontinue the drug after the patient has had a bowel movement. The change was approved by the [place] and the Medical Executive Committee.   This patient has had bowel movements documented by nursing. Therefore, alvimopan has been discontinued. If there are questions, please contact the pharmacy at [phone].   Thank you- Dolly Rias RPh 06/29/2019, 10:18 AM

## 2019-06-30 LAB — CBC
HCT: 24.1 % — ABNORMAL LOW (ref 36.0–46.0)
Hemoglobin: 7.5 g/dL — ABNORMAL LOW (ref 12.0–15.0)
MCH: 24.3 pg — ABNORMAL LOW (ref 26.0–34.0)
MCHC: 31.1 g/dL (ref 30.0–36.0)
MCV: 78 fL — ABNORMAL LOW (ref 80.0–100.0)
Platelets: 258 10*3/uL (ref 150–400)
RBC: 3.09 MIL/uL — ABNORMAL LOW (ref 3.87–5.11)
RDW: 18 % — ABNORMAL HIGH (ref 11.5–15.5)
WBC: 22.7 10*3/uL — ABNORMAL HIGH (ref 4.0–10.5)
nRBC: 0 % (ref 0.0–0.2)

## 2019-06-30 LAB — BASIC METABOLIC PANEL
Anion gap: 11 (ref 5–15)
BUN: 6 mg/dL (ref 6–20)
CO2: 26 mmol/L (ref 22–32)
Calcium: 8.2 mg/dL — ABNORMAL LOW (ref 8.9–10.3)
Chloride: 102 mmol/L (ref 98–111)
Creatinine, Ser: 0.65 mg/dL (ref 0.44–1.00)
GFR calc Af Amer: >60 mL/min (ref >60)
GFR calc non Af Amer: >60 mL/min (ref >60)
Glucose, Bld: 118 mg/dL — ABNORMAL HIGH (ref 70–99)
Potassium: 3.4 mmol/L — ABNORMAL LOW (ref 3.5–5.1)
Sodium: 139 mmol/L (ref 135–145)

## 2019-06-30 LAB — SURGICAL PATHOLOGY

## 2019-06-30 MED ORDER — MAGIC MOUTHWASH W/LIDOCAINE
5.0000 mL | Freq: Four times a day (QID) | ORAL | Status: DC
  Administered 2019-06-30 – 2019-07-02 (×7): 5 mL via ORAL
  Filled 2019-06-30 (×10): qty 5

## 2019-06-30 MED ORDER — OXYCODONE HCL 5 MG PO TABS
5.0000 mg | ORAL_TABLET | ORAL | Status: DC | PRN
  Filled 2019-06-30: qty 1

## 2019-06-30 NOTE — TOC Progression Note (Signed)
Transition of Care [facility]) - Progression Note    Patient Details  Name: [name] MRN: [id number] Date of Birth: [birth year]  Transition of Care [facility] CM/SW [place], [place] Phone Number: 06/30/2019, 10:13 AM  Clinical Narrative:    Consult-PCP appointment CSW gave the patient instructions to call the number on the back of the insurance card to determine her in network primary care providers. Patient reports understanding.  No other needs identified.     Expected Discharge Plan: Home/Self Care Barriers to Discharge: No Barriers Identified  Expected Discharge Plan and Services Expected Discharge Plan: Home/Self Care                                               Social Determinants of Health (SDOH) Interventions    Readmission Risk Interventions No flowsheet data found.

## 2019-06-30 NOTE — Plan of Care (Signed)

## 2019-06-30 NOTE — Progress Notes (Signed)
3 Days Post-Op robotic subtotal colectomy with IRA Subjective: No more fevers.  Good UOP. No nausea.  Not much appetite yet.  No nausea.  Ambulating some.  Having some bleeding with urination   Objective: Vital signs in last 24 hours: Temp:  [97.3 F (36.3 C)-98.7 F (37.1 C)] 98.7 F (37.1 C) (03/22 0527) Pulse Rate:  [111-135] 113 (03/22 0527) Resp:  [16-18] 18 (03/22 0527) BP: (125-135)/(83-94) 125/83 (03/22 0527) SpO2:  [99 %-100 %] 99 % (03/22 0527)   Intake/Output from previous day: 03/21 0701 - 03/22 0700 In: 1703.8 [P.O.:1200; I.V.:503.8] Out: 1200 [Urine:1200] Intake/Output this shift: No intake/output data recorded.   General appearance: alert and cooperative GI: normal findings: soft, appropriately tender, mildly distended  Incision: no significant drainage  Lab Results:  Recent Labs    06/29/19 0336 06/30/19 0423  WBC 18.8* 22.7*  HGB 7.7* 7.5*  HCT 25.2* 24.1*  PLT 249 258   BMET Recent Labs    06/29/19 0336 06/30/19 0423  NA 134* 139  K 3.4* 3.4*  CL 103 102  CO2 24 26  GLUCOSE 129* 118*  BUN 6 6  CREATININE 0.80 0.65  CALCIUM 7.4* 8.2*   PT/INR No results for input(s): LABPROT, INR in the last 72 hours. ABG No results for input(s): PHART, HCO3 in the last 72 hours.  Invalid input(s): PCO2, PO2  MEDS, Scheduled . acetaminophen  1,000 mg Oral Q6H  . Chlorhexidine Gluconate Cloth  6 each Topical Daily  . enoxaparin (LOVENOX) injection  40 mg Subcutaneous Q24H  . feeding supplement  237 mL Oral BID BM  . gabapentin  300 mg Oral BID  . mesalamine  2.4 g Oral BID  . saccharomyces boulardii  250 mg Oral BID    Studies/Results: No results found.  Assessment: s/p Procedure(s): ROBOTIC ASSISTED SUBTOTAL COLECTOMY WITH ILEOSIGMOID ANASTOMOSIS Patient Active Problem List   Diagnosis Date Noted  . Crohn's colitis, with intestinal obstruction ([place]) 06/27/2019  . Mass of colon 05/07/2019  . Abnormal finding present on diagnostic imaging  of uterus 05/07/2019  . Colon stricture ([place]) 05/07/2019  . Vitamin D deficiency 09/24/2013  . Crohn's disease of small and large intestines with complication ([place]) 55/04/5866  . 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 10/17/2010    Expected post op course.  Pt with tachycardia, which is very common in post op Crohn's patients.  This is resolving.  No signs of dehydration or severe anemia.   Plan: Cont reg diet as tolerated Ambulate Decrease IVF     LOS: 3 days     .Rosario Adie, [facility], [place]    06/30/2019 7:21 AM

## 2019-07-01 LAB — CBC
HCT: 23.6 % — ABNORMAL LOW (ref 36.0–46.0)
Hemoglobin: 7.3 g/dL — ABNORMAL LOW (ref 12.0–15.0)
MCH: 23.7 pg — ABNORMAL LOW (ref 26.0–34.0)
MCHC: 30.9 g/dL (ref 30.0–36.0)
MCV: 76.6 fL — ABNORMAL LOW (ref 80.0–100.0)
Platelets: 303 10*3/uL (ref 150–400)
RBC: 3.08 MIL/uL — ABNORMAL LOW (ref 3.87–5.11)
RDW: 18 % — ABNORMAL HIGH (ref 11.5–15.5)
WBC: 17.3 10*3/uL — ABNORMAL HIGH (ref 4.0–10.5)
nRBC: 0 % (ref 0.0–0.2)

## 2019-07-01 MED ORDER — ENSURE ENLIVE PO LIQD
237.0000 mL | Freq: Two times a day (BID) | ORAL | Status: DC
  Administered 2019-07-01 – 2019-07-02 (×3): 237 mL via ORAL

## 2019-07-01 MED ORDER — HYDROCODONE-ACETAMINOPHEN 5-325 MG PO TABS
1.0000 | ORAL_TABLET | ORAL | Status: DC | PRN
  Administered 2019-07-01 – 2019-07-02 (×4): 2 via ORAL
  Filled 2019-07-01 (×4): qty 2

## 2019-07-01 NOTE — Progress Notes (Signed)
4 Days Post-Op robotic subtotal colectomy with IRA Subjective: Occasional nausea. Tolerating a bit more of her diet.  Ambulating in hall.  Having some bleeding vaginally.   Objective: Vital signs in last 24 hours: Temp:  [98.4 F (36.9 C)-99.2 F (37.3 C)] 98.8 F (37.1 C) (03/23 0629) Pulse Rate:  [113-123] 117 (03/23 0629) Resp:  [17-18] 18 (03/23 0629) BP: (127-146)/(85-97) 140/94 (03/23 0629) SpO2:  [97 %-100 %] 100 % (03/23 0629)   Intake/Output from previous day: 03/22 0701 - 03/23 0700 In: 1640 [P.O.:1640] Out: 0  Intake/Output this shift: No intake/output data recorded.  General appearance: alert and cooperative GI: normal findings: soft, appropriately tender, mildly distended  Incision: no significant drainage  Lab Results:  Recent Labs    06/30/19 0423 07/01/19 0432  WBC 22.7* 17.3*  HGB 7.5* 7.3*  HCT 24.1* 23.6*  PLT 258 303   BMET Recent Labs    06/29/19 0336 06/30/19 0423  NA 134* 139  K 3.4* 3.4*  CL 103 102  CO2 24 26  GLUCOSE 129* 118*  BUN 6 6  CREATININE 0.80 0.65  CALCIUM 7.4* 8.2*   PT/INR No results for input(s): LABPROT, INR in the last 72 hours. ABG No results for input(s): PHART, HCO3 in the last 72 hours.  Invalid input(s): PCO2, PO2  MEDS, Scheduled . Chlorhexidine Gluconate Cloth  6 each Topical Daily  . enoxaparin (LOVENOX) injection  40 mg Subcutaneous Q24H  . feeding supplement  237 mL Oral BID BM  . gabapentin  300 mg Oral BID  . magic mouthwash w/lidocaine  5 mL Oral QID  . mesalamine  2.4 g Oral BID  . saccharomyces boulardii  250 mg Oral BID    Studies/Results: No results found.  Assessment: s/p Procedure(s): ROBOTIC ASSISTED SUBTOTAL COLECTOMY WITH ILEOSIGMOID ANASTOMOSIS Patient Active Problem List   Diagnosis Date Noted  . Crohn's colitis, with intestinal obstruction ([place]) 06/27/2019  . Mass of colon 05/07/2019  . Abnormal finding present on diagnostic imaging of uterus 05/07/2019  . Colon stricture  ([place]) 05/07/2019  . Vitamin D deficiency 09/24/2013  . Crohn's disease of small and large intestines with complication ([place]) 68/03/7516  . 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 10/17/2010    Expected post op course.  Pt with tachycardia, which is very common in post op Crohn's patients.  This is resolving.  No signs of dehydration or severe anemia.   Plan: Cont reg diet as tolerated Ambulate SL IVF     LOS: 4 days     .Rosario Adie, [facility], [place]    07/01/2019 8:34 AM

## 2019-07-02 MED ORDER — FERROUS SULFATE 325 (65 FE) MG PO TABS: 325.0000 mg | ORAL_TABLET | Freq: Three times a day (TID) | ORAL | 3 refills | Status: DC

## 2019-07-02 MED ORDER — HYDROCODONE-ACETAMINOPHEN 5-325 MG PO TABS: 1.0000 | ORAL_TABLET | Freq: Four times a day (QID) | ORAL | 0 refills | Status: DC | PRN

## 2019-07-02 MED ORDER — LOPERAMIDE HCL 2 MG PO TABS: 2.0000 mg | ORAL_TABLET | Freq: Four times a day (QID) | ORAL | 0 refills | Status: DC | PRN

## 2019-07-02 NOTE — Discharge Instructions (Signed)

## 2019-07-02 NOTE — Discharge Summary (Signed)
Physician Discharge Summary  Patient ID: [name] MRN: [id number] DOB/AGE: Nov 24, 1975 44 y.o.  Admit date: 06/27/2019 Discharge date: 07/02/2019  Admission Diagnoses: Crohn's colitis with stricture  Discharge Diagnoses:  Active Problems:   Crohn's colitis, with intestinal obstruction [facility])   Discharged Condition: good  Hospital Course: Patient was admitted after robotic assisted total abdominal colectomy.  Her diet was advanced as tolerated.  By postop day three she was having bowel function.  She was urinating without difficulty.  We monitored her hemoglobin for signs of severe anemia.  This stabilized around seven.  By postop day five she was in stable condition for discharge to home.  She was tolerating a diet without difficulty and ambulating well in the hallways.  She was having good bowel function.  Consults: None  Significant Diagnostic Studies: labs: cbc, bmet  Treatments: IV hydration, analgesia: Vicodin and surgery: robotic assisted total abd colectomy  Discharge Exam: Blood pressure 132/82, pulse (!) 107, temperature 98.5 F (36.9 C), temperature source Oral, resp. rate 18, height 5' 5"  (1.651 m), weight 65.5 kg, last menstrual period 06/09/2019, SpO2 100 %. General appearance: alert and cooperative GI: normal findings: soft, non-tender Incision/Wound:  Disposition:   HOme   Follow-up Information    [name], MD Follow up.   Specialty: General Surgery Why: as scheduled Contact information: [address] [place] [address] [phone]           Signed: Rosario Adie 5/82/5189, 11:35 AM

## 2019-07-03 ENCOUNTER — Telehealth: Payer: Self-pay | Admitting: *Deleted

## 2019-07-03 ENCOUNTER — Encounter: Payer: Self-pay | Admitting: *Deleted

## 2019-07-03 NOTE — Telephone Encounter (Signed)
Spoke to the patient who has been scheduled to see Dr. Carlean Purl for follow up on 08/13/19 at 8:30 am.   Reminder letter sent to the patient.

## 2019-07-03 NOTE — Telephone Encounter (Signed)
-----   Message from [name], MD sent at 07/02/2019 12:54 PM EDT ----- Regarding: RE: [name] Thank you for the update  We will schedule a GI follow-up  Brttani please make her a May appointment ----- Message ----- From: [name], MD Sent: 12/31/3007  11:39 AM EDT To: [name], MD Subject: [name]                                            She did pretty well with her surgery.  Final pathology shows no sign of malignancy.  I have restarted her Lialda.  Elmo Putt

## 2019-07-14 ENCOUNTER — Other Ambulatory Visit: Payer: Self-pay | Admitting: General Surgery

## 2019-07-14 DIAGNOSIS — K50112 Crohn's disease of large intestine with intestinal obstruction: Secondary | ICD-10-CM

## 2019-07-14 DIAGNOSIS — R1032 Left lower quadrant pain: Secondary | ICD-10-CM

## 2019-07-24 ENCOUNTER — Other Ambulatory Visit: Payer: Self-pay | Admitting: General Surgery

## 2019-07-24 DIAGNOSIS — R1032 Left lower quadrant pain: Secondary | ICD-10-CM

## 2019-07-24 DIAGNOSIS — K50112 Crohn's disease of large intestine with intestinal obstruction: Secondary | ICD-10-CM

## 2019-07-30 ENCOUNTER — Other Ambulatory Visit: Payer: Managed Care, Other (non HMO)

## 2019-08-13 ENCOUNTER — Ambulatory Visit: Payer: Managed Care, Other (non HMO) | Admitting: Internal Medicine

## 2019-08-13 ENCOUNTER — Encounter: Payer: Self-pay | Admitting: Internal Medicine

## 2019-08-13 VITALS — BP 106/72 | HR 66 | Temp 98.1°F | Ht 65.0 in | Wt 136.0 lb

## 2019-08-13 DIAGNOSIS — Z01818 Encounter for other preprocedural examination: Secondary | ICD-10-CM | POA: Diagnosis not present

## 2019-08-13 DIAGNOSIS — D500 Iron deficiency anemia secondary to blood loss (chronic): Secondary | ICD-10-CM

## 2019-08-13 DIAGNOSIS — K50819 Crohn's disease of both small and large intestine with unspecified complications: Secondary | ICD-10-CM | POA: Diagnosis not present

## 2019-08-13 NOTE — Progress Notes (Signed)
[name] 44 y.o. [birth year] [id number]  Assessment & Plan:  Crohn's disease of small and large intestines with complication ([place]) Much improved since surgery.  Plan is for restaging with endoscopic evaluation sigmoidoscopy with look into the ileum.  I explained that it is important to get a baseline.  If this all looks good may just continue the mesalamine.  She indicates she is not inclined to "take a bunch of medications".  I did explain that the Lialda is a colon medication and not a small bowel medication.  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 I will review the labs from Dr. Elta Guadeloupe Anderson's office.  Continue ferrous sulfate further plans pending that. Labs obtained but no Hgb or ferritin Will ask her to do these   I have encouraged her to get the Covid vaccine either the [place] I would avoid the J&J vaccine in her.  She will consider.  I appreciate the opportunity to care for this patient. CC: Dr. Freda Munro, Dr. [name] Subjective:   Chief Complaint: Follow-up of Crohn's disease after colectomy  HPI [name] is here for her first visit after a subtotal colectomy with ileosigmoid anastomosis by Dr. Marcello Moores in March, reporting that she feels much better.  She had colonic strictures and a mass lesion in the transverse colon that we were concerned about being malignant, fortunately it was all benign disease.  Pathology read as inflammatory polyps of transverse colon in the background of inflammatory bowel disease with strictures of transverse and sigmoid colons.  She is moving her bowels 2-3 times a day without difficulty not having pain.  She has been concerned about gaining weight that she lost and wonders about using CBD Gummies or prescription THC.  She was using the CBD Gummies at the recommendation of a friend but quit them.  She is on her mesalamine.  Recall that she had gone for years without taking medication before  returning with the stricturing.  She has not been vaccinated for Covid.  She reports that she does not have fatigue she has good energy she says Dr. Ouida Sills had checked her iron and it was good 2 weeks ago, at her GYN visit.  She believes she had an iron infusion in the hospital though I cannot find that in the records that I have.  Hemoglobin at discharge was 7.3 with MCV 76 Allergies  Allergen Reactions  . Other     BLOOD PRODUCT REFUSAL   Current Meds  Medication Sig  . ferrous sulfate 325 (65 FE) MG tablet Take 1 tablet (325 mg total) by mouth 3 (three) times daily with meals.  Marland Kitchen loperamide (IMODIUM A-D) 2 MG tablet Take 1 tablet (2 mg total) by mouth 4 (four) times daily as needed for diarrhea or loose stools.  . mesalamine (LIALDA) 1.2 g EC tablet Take 2.4 g by mouth 2 (two) times daily.   Past Medical History:  Diagnosis Date  . Colon stricture ([place]) 05/07/2019  . Crohn's disease ([place])   . Diarrhea   . 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 Rectal bleeding   . Uterine fibroid   . Vitamin D deficiency 09/24/2013   Past Surgical History:  Procedure Laterality Date  . COLECTOMY  06/27/2019  . COLONOSCOPY  2015   Social History   Social History Narrative   Single, 1 daughter born 2001 approximately   Careers information officer for [employer]   She  is a former smoker, occasional alcohol no drug use   family history includes Breast cancer in her maternal aunt; Diabetes in her father and paternal aunt.   Review of Systems As above  Objective:   Physical Exam BP 106/72   Pulse 66   Temp 98.1 F (36.7 C)   Ht 5' 5"  (1.651 m)   Wt 136 lb (61.7 kg)   BMI 22.63 kg/m  Well-developed well-nourished no acute distress young black woman Eyes are anicteric The abdomen has well-healed surgical scars a soft slightly protuberant and tympanic with active bowel sounds and no organomegaly or mass  Data reviewed includes hospitalization records from  March including pathology labs operative note discharge summary.  I have requested GYN records from this year including labs that showed normal iron level per patient

## 2019-08-13 NOTE — Patient Instructions (Signed)
  You have been scheduled for a flexible sigmoidoscopy. Please follow the written instructions given to you at your visit today. If you use inhalers (even only as needed), please bring them with you on the day of your procedure.   We are going to get your records from Dr Freda Munro for review.    I appreciate the opportunity to care for you. [name], MD, [facility]

## 2019-08-13 NOTE — Assessment & Plan Note (Addendum)
I will review the labs from Dr. Elta Guadeloupe Anderson's office.  Continue ferrous sulfate further plans pending that. Labs obtained but no Hgb or ferritin Will ask her to do these

## 2019-08-13 NOTE — Assessment & Plan Note (Signed)
Much improved since surgery.  Plan is for restaging with endoscopic evaluation sigmoidoscopy with look into the ileum.  I explained that it is important to get a baseline.  If this all looks good may just continue the mesalamine.  She indicates she is not inclined to "take a bunch of medications".  I did explain that the Lialda is a colon medication and not a small bowel medication.

## 2019-09-12 ENCOUNTER — Other Ambulatory Visit: Payer: Managed Care, Other (non HMO) | Admitting: Internal Medicine

## 2019-09-16 ENCOUNTER — Ambulatory Visit (INDEPENDENT_AMBULATORY_CARE_PROVIDER_SITE_OTHER): Payer: Managed Care, Other (non HMO)

## 2019-09-16 ENCOUNTER — Other Ambulatory Visit: Payer: Self-pay | Admitting: Internal Medicine

## 2019-09-16 DIAGNOSIS — Z1159 Encounter for screening for other viral diseases: Secondary | ICD-10-CM

## 2019-09-17 ENCOUNTER — Encounter: Payer: Self-pay | Admitting: Certified Registered Nurse Anesthetist

## 2019-09-17 LAB — SARS CORONAVIRUS 2 (TAT 6-24 HRS): SARS Coronavirus 2: NEGATIVE

## 2019-09-18 ENCOUNTER — Ambulatory Visit (AMBULATORY_SURGERY_CENTER): Payer: Managed Care, Other (non HMO) | Admitting: Internal Medicine

## 2019-09-18 ENCOUNTER — Other Ambulatory Visit: Payer: Self-pay

## 2019-09-18 ENCOUNTER — Encounter: Payer: Self-pay | Admitting: Internal Medicine

## 2019-09-18 VITALS — BP 126/81 | HR 83 | Temp 97.3°F | Resp 25 | Ht 65.0 in | Wt 136.0 lb

## 2019-09-18 DIAGNOSIS — K50819 Crohn's disease of both small and large intestine with unspecified complications: Secondary | ICD-10-CM | POA: Diagnosis present

## 2019-09-18 MED ORDER — SODIUM CHLORIDE 0.9 % IV SOLN: 500.0000 mL | Freq: Once | INTRAVENOUS | Status: DC

## 2019-09-18 NOTE — Progress Notes (Signed)
Pt's states no medical or surgical changes since previsit or office visit.  [place]

## 2019-09-18 NOTE — Progress Notes (Signed)
Called to room to assist during endoscopic procedure.  Patient ID and intended procedure confirmed with present staff. Received instructions for my participation in the procedure from the performing physician.  

## 2019-09-18 NOTE — Progress Notes (Signed)
Report given to PACU, vss 

## 2019-09-18 NOTE — Op Note (Signed)
[name] Patient Name: [name] Procedure Date: 09/18/2019 10:49 AM MRN: [id number] Endoscopist: [name] , MD Age: 44 Referring MD:  Date of Birth: [birth year] Gender: Female Account #: [id number] Procedure:                Flexible Sigmoidoscopy Indications:              Crohn's disease of the small bowel and colon,                            Follow-up of Crohn's disease of the small bowel and                            colon, Disease activity assessment of Crohn's                            disease of the small bowel and colon Medicines:                Propofol per Anesthesia, Monitored Anesthesia Care Procedure:                Pre-Anesthesia Assessment:                           - Prior to the procedure, a History and Physical                            was performed, and patient medications and                            allergies were reviewed. The patient's tolerance of                            previous anesthesia was also reviewed. The risks                            and benefits of the procedure and the sedation                            options and risks were discussed with the patient.                            All questions were answered, and informed consent                            was obtained. Prior Anticoagulants: The patient has                            taken no previous anticoagulant or antiplatelet                            agents. ASA Grade Assessment: II - A patient with                            mild systemic disease. After reviewing the risks  and benefits, the patient was deemed in                            satisfactory condition to undergo the procedure.                           After obtaining informed consent, the scope was                            passed under direct vision. The Colonoscope was                            introduced through the anus and advanced to the the                             ileo-sigmoid anastomosis and into the ileum The                            flexible sigmoidoscopy was accomplished without                            difficulty. The patient tolerated the procedure                            well. The quality of the bowel preparation was                            excellent. Scope In: 11:05:15 AM Scope Out: 11:13:31 AM Total Procedure Duration: 0 hours 8 minutes 16 seconds  Findings:                 The perianal and digital rectal examinations were                            normal.                           There was evidence of a prior end-to-side                            ileo-colonic anastomosis in the sigmoid colon. This                            was patent and was characterized by inflammation.                            The anastomosis was traversed. Biopsies were taken                            with a cold forceps for histology. Verification of                            patient identification for the specimen was done.  Estimated blood loss was minimal.                           The ileum appeared normal. Biopsies were taken with                            a cold forceps for histology. Verification of                            patient identification for the specimen was done.                            Estimated blood loss was minimal. Advanved 30-40 cm                            beyond anastomosis                           The rectum and sigmoid colon appeared normal.                            Biopsies were taken with a cold forceps for                            histology. Verification of patient identification                            for the specimen was done. Estimated blood loss was                            minimal. Complications:            No immediate complications. Estimated Blood Loss:     Estimated blood loss was minimal. Impression:               - Patent end-to-side ileo-colonic anastomosis,                             characterized by inflammation. Biopsied. This is                            post-surgical non-specific inflammation vs Crohn's                            persistence                           - The terminal ileum is normal. Biopsied.                           - The rectum and sigmoid colon are normal. Biopsied. Recommendation:           - The patient will be observed post-procedure,                            until all discharge criteria are met.                           -  Resume previous diet.                           - Await pathology results. Overall this looks good.                            The inflammatory changes at the anastomosis could                            be normal post-op findings. Seems like observation                            would be a good option in her. will discuss after                            patholog is in [name], MD 09/18/2019 11:26:06 AM This report has been signed electronically.

## 2019-09-18 NOTE — Patient Instructions (Addendum)
There was slight inflammation where the intestines were put together - most often this is related to the surgery and NOT active Crohn's but I took biopsies of the and the small intestine + rectum which all looked ok.  Once I get the biopsies back will let you know.  I think things look good overall.  I appreciate the opportunity to care for you. [name], MD, FACG  YOU HAD AN ENDOSCOPIC PROCEDURE TODAY AT [facility]:   Refer to the procedure report that was given to you for any specific questions about what was found during the examination.  If the procedure report does not answer your questions, please call your gastroenterologist to clarify.  If you requested that your care partner not be given the details of your procedure findings, then the procedure report has been included in a sealed envelope for you to review at your convenience later.  YOU SHOULD EXPECT: Some feelings of bloating in the abdomen. Passage of more gas than usual.  Walking can help get rid of the air that was put into your GI tract during the procedure and reduce the bloating. If you had a lower endoscopy (such as a colonoscopy or flexible sigmoidoscopy) you may notice spotting of blood in your stool or on the toilet paper. If you underwent a bowel prep for your procedure, you may not have a normal bowel movement for a few days.  Please Note:  You might notice some irritation and congestion in your nose or some drainage.  This is from the oxygen used during your procedure.  There is no need for concern and it should clear up in a day or so.  SYMPTOMS TO REPORT IMMEDIATELY:   Following lower endoscopy (colonoscopy or flexible sigmoidoscopy):  Excessive amounts of blood in the stool  Significant tenderness or worsening of abdominal pains  Swelling of the abdomen that is new, acute  Fever of 100F or higher  For urgent or emergent issues, a gastroenterologist can be reached at any hour by calling  [phone]. Do not use MyChart messaging for urgent concerns.    DIET:  We do recommend a small meal at first, but then you may proceed to your regular diet.  Drink plenty of fluids but you should avoid alcoholic beverages for 24 hours.  ACTIVITY:  You should plan to take it easy for the rest of today and you should NOT DRIVE or use heavy machinery until tomorrow (because of the sedation medicines used during the test).    FOLLOW UP: Our staff will call the number listed on your records 48-72 hours following your procedure to check on you and address any questions or concerns that you may have regarding the information given to you following your procedure. If we do not reach you, we will leave a message.  We will attempt to reach you two times.  During this call, we will ask if you have developed any symptoms of COVID 19. If you develop any symptoms (ie: fever, flu-like symptoms, shortness of breath, cough etc.) before then, please call [phone].  If you test positive for Covid 19 in the 2 weeks post procedure, please call and report this information to [place].    If any biopsies were taken you will be contacted by phone or by letter within the next 1-3 weeks.  Please call us at [phone] if you have not heard about the biopsies in 3 weeks.    SIGNATURES/CONFIDENTIALITY: You and/or your  care partner have signed paperwork which will be entered into your electronic medical record.  These signatures attest to the fact that that the information above on your After Visit Summary has been reviewed and is understood.  Full responsibility of the confidentiality of this discharge information lies with you and/or your care-partner.

## 2019-09-22 ENCOUNTER — Telehealth: Payer: Self-pay

## 2019-09-22 NOTE — Telephone Encounter (Signed)
Covid-19 screening questions   Do you now or have you had a fever in the last 14 days? No.  Do you have any respiratory symptoms of shortness of breath or cough now or in the last 14 days? No.  Do you have any family members or close contacts with diagnosed or suspected Covid-19 in the past 14 days? No.  Have you been tested for Covid-19 and found to be positive? No.       Follow up Call-  Call back number 09/18/2019 04/21/2019  Post procedure Call Back phone  # [phone]  Permission to leave phone message Yes Yes  Some recent data might be hidden     Patient questions:  Do you have a fever, pain , or abdominal swelling? No. Pain Score  0 *  Have you tolerated food without any problems? Yes.    Have you been able to return to your normal activities? Yes.    Do you have any questions about your discharge instructions: Diet   No. Medications  No. Follow up visit  No.  Do you have questions or concerns about your Care? No.  Actions: * If pain score is 4 or above: No action needed, pain <4.

## 2019-10-06 ENCOUNTER — Telehealth: Payer: Self-pay | Admitting: Internal Medicine

## 2019-10-06 NOTE — Telephone Encounter (Signed)
Spoke with patient, patient aware to discontinue Lialda. Removed from [place].

## 2019-10-06 NOTE — Telephone Encounter (Signed)
Spoke with patient regarding pathology results, read Dr. Celesta Aver My Chart Message to patient. Patient scheduled for follow up on 11/20/19 at 9:10 am with Dr. Carlean Purl. Patient wanting to know if you would like her to continue Lialda? Please advise, thank you.

## 2019-10-06 NOTE — Telephone Encounter (Signed)
We can dc Lialda at this time  Please take off med list  Thanks

## 2019-11-20 ENCOUNTER — Ambulatory Visit: Payer: Managed Care, Other (non HMO) | Admitting: Internal Medicine

## 2019-12-18 ENCOUNTER — Ambulatory Visit: Payer: Managed Care, Other (non HMO) | Admitting: Internal Medicine

## 2019-12-23 ENCOUNTER — Ambulatory Visit: Payer: Managed Care, Other (non HMO) | Admitting: Gastroenterology

## 2020-01-21 ENCOUNTER — Encounter: Payer: Self-pay | Admitting: Gastroenterology

## 2020-01-21 ENCOUNTER — Ambulatory Visit (INDEPENDENT_AMBULATORY_CARE_PROVIDER_SITE_OTHER): Payer: Managed Care, Other (non HMO) | Admitting: Gastroenterology

## 2020-01-21 VITALS — BP 144/80 | HR 92 | Ht 63.25 in | Wt 153.2 lb

## 2020-01-21 DIAGNOSIS — D500 Iron deficiency anemia secondary to blood loss (chronic): Secondary | ICD-10-CM

## 2020-01-21 DIAGNOSIS — K50819 Crohn's disease of both small and large intestine with unspecified complications: Secondary | ICD-10-CM

## 2020-01-21 NOTE — Patient Instructions (Addendum)
.  If you are age 42 or older, your body mass index should be between 23-30. Your Body mass index is 26.93 kg/m. If this is out of the aforementioned range listed, please consider follow up with your Primary Care Provider.  If you are age 17 or younger, your body mass index should be between 19-25. Your Body mass index is 26.93 kg/m. If this is out of the aformentioned range listed, please consider follow up with your Primary Care Provider.   Your provider has requested that you go for lab work today.   Follow up in 1 year or sooner if needed.

## 2020-01-21 NOTE — Progress Notes (Signed)
01/21/2020 [name] [id number] [birth year]   HISTORY OF PRESENT ILLNESS: This is a 44 year old female who is a patient of Dr. Celesta Aver.  She has Crohn's disease with colonic strictures and a mass lesion in the transverse colon that they were concerned about being malignant, but fortunately was all benign disease.  She had a subtotal colectomy with ileosigmoid anastomosis by Dr. Marcello Moores in March of this year.  Pathology from her surgery read as inflammatory polyps of the transverse colon in the background of inflammatory bowel disease with strictures of the transverse and sigmoid colon.  She saw Dr. Carlean Purl in May and then underwent flexible sigmoidoscopy in June.  Everything looked good except for some inflammation at the anastomosis, but biopsies were consistent with surgical changes rather than Crohn's disease.  She had been on Lialda but after her flexible sigmoidoscopy Dr. Carlean Purl told her that she could discontinue it.  She has now been off of medication since the end of June.  She says that she is having 3 formed bowel movements a day for the most part.  Denies seeing blood in her stools and denies abdominal pain.  She has gained 17 pounds since her visit with Dr. Carlean Purl in May.  Her only medication at this time is a daily multivitamin.  She also has history of iron deficiency anemia.  We do not have any labs in our system since she had her surgery in March.   Past Medical History:  Diagnosis Date  . Colon stricture ([place]) 05/07/2019  . Crohn's disease ([place])   . Diarrhea   . 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 Rectal bleeding   . Uterine fibroid   . Vitamin D deficiency 09/24/2013   Past Surgical History:  Procedure Laterality Date  . COLECTOMY  06/27/2019  . COLONOSCOPY  2015    reports that she has quit smoking. Her smoking use included cigarettes. She has never used smokeless tobacco. She reports current alcohol use of  about 1.0 - 2.0 standard drink of alcohol per week. She reports that she does not use drugs. family history includes Breast cancer in her maternal aunt; Diabetes in her father and paternal aunt. Allergies  Allergen Reactions  . Other     BLOOD PRODUCT REFUSAL      Outpatient Encounter Medications as of 01/21/2020  Medication Sig  . Multiple Vitamin (MULTIVITAMIN) tablet Take 1 tablet by mouth daily.  . [DISCONTINUED] ferrous sulfate 325 (65 FE) MG tablet Take 1 tablet (325 mg total) by mouth 3 (three) times daily with meals.  . [DISCONTINUED] loperamide (IMODIUM A-D) 2 MG tablet Take 1 tablet (2 mg total) by mouth 4 (four) times daily as needed for diarrhea or loose stools.   No facility-administered encounter medications on file as of 01/21/2020.     REVIEW OF SYSTEMS  : All other systems reviewed and negative except where noted in the History of Present Illness.   PHYSICAL EXAM: BP (!) 144/80 (BP Location: Left Arm, Patient Position: Sitting, Cuff Size: Normal)   Pulse 92   Ht 5' 3.25" (1.607 m) Comment: height measured without shoes  Wt 153 lb 4 oz (69.5 kg)   LMP 12/24/2019   BMI 26.93 kg/m  General: Well developed AA female in no acute distress Head: Normocephalic and atraumatic Eyes:  Sclerae anicteric, conjunctiva pink. Ears: Normal auditory acuity Lungs: Clear throughout to auscultation; no W/R/R. Heart: Regular rate and rhythm; no M/R/G. Abdomen: Soft,  non-distended.  BS present.  Non-tender.  Well-healed midline scar noted. Musculoskeletal: Symmetrical with no gross deformities  Skin: No lesions on visible extremities Extremities: No edema  Neurological: Alert oriented x 4, grossly non-focal Psychological:  Alert and cooperative. Normal mood and affect  ASSESSMENT AND PLAN: *44 year old female with Crohn's disease status post subtotal colectomy with ileosigmoid anastomosis by Dr. Marcello Moores in March 2021.  Has been doing really well since that time.  Flexible  sigmoidoscopy in June looked good.  She had been on Lialda, but she was told that she could discontinue that after her flex sig.  She continues to do well without medication.  Will check a CBC, BMP, iron studies today.  If all looks well she can follow-up here in a year or sooner if needed. *History of iron deficiency anemia: No labs in our system since March when she had her surgery.  We will recheck CBC and iron studies today.  CC:  No ref. provider found

## 2020-01-22 LAB — BASIC METABOLIC PANEL
BUN/Creatinine Ratio: 15 (ref 9–23)
BUN: 12 mg/dL (ref 6–24)
CO2: 22 mmol/L (ref 20–29)
Calcium: 9.4 mg/dL (ref 8.7–10.2)
Chloride: 101 mmol/L (ref 96–106)
Creatinine, Ser: 0.78 mg/dL (ref 0.57–1.00)
GFR calc Af Amer: 108 mL/min/{1.73_m2} (ref >59)
GFR calc non Af Amer: 94 mL/min/{1.73_m2} (ref >59)
Glucose: 98 mg/dL (ref 65–99)
Potassium: 3.9 mmol/L (ref 3.5–5.2)
Sodium: 135 mmol/L (ref 134–144)

## 2020-01-22 LAB — CBC WITH DIFFERENTIAL/PLATELET
Basophils Absolute: 0 10*3/uL (ref 0.0–0.2)
Basos: 0 %
EOS (ABSOLUTE): 0 10*3/uL (ref 0.0–0.4)
Eos: 0 %
Hematocrit: 33.9 % — ABNORMAL LOW (ref 34.0–46.6)
Hemoglobin: 10.4 g/dL — ABNORMAL LOW (ref 11.1–15.9)
Immature Grans (Abs): 0 10*3/uL (ref 0.0–0.1)
Immature Granulocytes: 0 %
Lymphocytes Absolute: 1.3 10*3/uL (ref 0.7–3.1)
Lymphs: 14 %
MCH: 21.7 pg — ABNORMAL LOW (ref 26.6–33.0)
MCHC: 30.7 g/dL — ABNORMAL LOW (ref 31.5–35.7)
MCV: 71 fL — ABNORMAL LOW (ref 79–97)
Monocytes Absolute: 0.6 10*3/uL (ref 0.1–0.9)
Monocytes: 7 %
Neutrophils Absolute: 7.2 10*3/uL — ABNORMAL HIGH (ref 1.4–7.0)
Neutrophils: 79 %
Platelets: 346 10*3/uL (ref 150–450)
RBC: 4.79 x10E6/uL (ref 3.77–5.28)
RDW: 18.8 % — ABNORMAL HIGH (ref 11.7–15.4)
WBC: 9.2 10*3/uL (ref 3.4–10.8)

## 2020-01-22 LAB — IRON AND TIBC
Iron Saturation: 9 % — CL (ref 15–55)
Iron: 39 ug/dL (ref 27–159)
Total Iron Binding Capacity: 424 ug/dL (ref 250–450)
UIBC: 385 ug/dL (ref 131–425)

## 2020-01-22 LAB — FERRITIN: Ferritin: 6 ng/mL — ABNORMAL LOW (ref 15–150)

## 2020-02-05 ENCOUNTER — Other Ambulatory Visit: Payer: Self-pay

## 2020-02-05 DIAGNOSIS — D500 Iron deficiency anemia secondary to blood loss (chronic): Secondary | ICD-10-CM

## 2020-02-06 ENCOUNTER — Telehealth: Payer: Self-pay | Admitting: Hematology and Oncology

## 2020-02-06 NOTE — Telephone Encounter (Signed)
Received a new hem referral from [facility] for [facility]. Pt has been cld and scheduled to see Dr. Alvy Bimler on 11/16 at 2pm. Pt aware to arrive 30 minutes early.

## 2020-02-23 NOTE — Progress Notes (Deleted)
[facility] CONSULT NOTE  Patient Care Team: Patient, [name] as PCP - General (General Practice)  CHIEF COMPLAINTS/PURPOSE OF CONSULTATION:  Severe iron deficiency anemia secondary to Crohn's disease  HISTORY OF PRESENTING ILLNESS:  [name] 44 y.o. female is here because of diagnosis of severe iron deficiency anemia due to Crohn's disease  She was found to have abnormal CBC from routine blood count monitoring I have the opportunity to review her CBC all the way to 2011 Between Aug 27, 2009 to present, her hemoglobin fluctuated from as low as 7.32 as high as 12.5 Her latest blood test result on January 21, 2020 show hemoglobin of 10.4 with an MCV of 71 Iron studies was abnormally low with iron saturation 9% and ferritin of 6  ***She denies recent chest pain on exertion, shortness of breath on minimal exertion, pre-syncopal episodes, or palpitations. ***She had not noticed any recent bleeding such as epistaxis, hematuria or hematochezia ***The patient denies over the counter NSAID ingestion. She is not *** on antiplatelets agents. Her last colonoscopy was *** ***She had no prior history or diagnosis of cancer. Her age appropriate screening programs are up-to-date. ***She denies any pica and eats a variety of diet. ***She never donated blood or received blood transfusion ***The patient was prescribed oral iron supplements and she takes ***  MEDICAL HISTORY:  Past Medical History:  Diagnosis Date  . Colon stricture ([place]) 05/07/2019  . Crohn's disease ([place])   . Diarrhea   . 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 Rectal bleeding   . Uterine fibroid   . Vitamin D deficiency 09/24/2013    SURGICAL HISTORY: Past Surgical History:  Procedure Laterality Date  . COLECTOMY  06/27/2019  . COLONOSCOPY  2015    SOCIAL HISTORY: Social History   Socioeconomic History  . Marital status: Single    Spouse name: Not  on file  . Number of children: 1  . Years of education: 15  . Highest education level: Not on file  Occupational History  . Occupation: Medical Coder   Tobacco Use  . Smoking status: Former Smoker    Types: Cigarettes  . Smokeless tobacco: Never Used  Vaping Use  . Vaping Use: Never used  Substance and Sexual Activity  . Alcohol use: Yes    Alcohol/week: 1.0 - 2.0 standard drink    Types: 1 - 2 Glasses of wine per week  . Drug use: No  . Sexual activity: Yes    Partners: Male    Comment: Intermittent protection not consistent  Other Topics Concern  . Not on file  Social History Narrative   Single, 1 daughter born 2001 approximately   Medical coder for [employer]   She is a former smoker, occasional alcohol no drug use   Social Determinants of Radio broadcast assistant Strain:   . Difficulty of Paying Living Expenses: Not on file  Food Insecurity:   . Worried About Charity fundraiser in the Last Year: Not on file  . Ran Out of Food in the Last Year: Not on file  Transportation Needs:   . Lack of Transportation (Medical): Not on file  . Lack of Transportation (Non-Medical): Not on file  Physical Activity:   . Days of Exercise per Week: Not on file  . Minutes of Exercise per Session: Not on file  Stress:   . Feeling of Stress : Not on file  Social Connections:   .  Frequency of Communication with Friends and Family: Not on file  . Frequency of Social Gatherings with Friends and Family: Not on file  . Attends Religious Services: Not on file  . Active Member of Clubs or Organizations: Not on file  . Attends Archivist Meetings: Not on file  . Marital Status: Not on file  Intimate Partner Violence:   . Fear of Current or Ex-Partner: Not on file  . Emotionally Abused: Not on file  . Physically Abused: Not on file  . Sexually Abused: Not on file    FAMILY HISTORY: Family History  Problem Relation Age of Onset  . Breast cancer Maternal Aunt   . Diabetes  Father   . Diabetes Paternal Aunt   . Esophageal cancer Neg Hx   . Rectal cancer Neg Hx   . Stomach cancer Neg Hx   . Colon polyps Neg Hx   . Colon cancer Neg Hx     ALLERGIES:  is allergic to other.  MEDICATIONS:  Current Outpatient Medications  Medication Sig Dispense Refill  . Multiple Vitamin (MULTIVITAMIN) tablet Take 1 tablet by mouth daily.     No current facility-administered medications for this visit.    REVIEW OF SYSTEMS:   Constitutional: Denies fevers, chills or abnormal night sweats Eyes: Denies blurriness of vision, double vision or watery eyes Ears, nose, mouth, throat, and face: Denies mucositis or sore throat Respiratory: Denies cough, dyspnea or wheezes Cardiovascular: Denies palpitation, chest discomfort or lower extremity swelling Gastrointestinal:  Denies nausea, heartburn or change in bowel habits Skin: Denies abnormal skin rashes Lymphatics: Denies new lymphadenopathy or easy bruising Neurological:Denies numbness, tingling or new weaknesses Behavioral/Psych: Mood is stable, no new changes  All other systems were reviewed with the patient and are negative.  PHYSICAL EXAMINATION: ECOG PERFORMANCE STATUS: {CHL ONC ECOG PS:[id number]}  There were no vitals filed for this visit. There were no vitals filed for this visit.  GENERAL:alert, no distress and comfortable SKIN: skin color, texture, turgor are normal, no rashes or significant lesions EYES: normal, conjunctiva are pink and non-injected, sclera clear OROPHARYNX:no exudate, no erythema and lips, buccal mucosa, and tongue normal  NECK: supple, thyroid normal size, non-tender, without nodularity LYMPH:  no palpable lymphadenopathy in the cervical, axillary or inguinal LUNGS: clear to auscultation and percussion with normal breathing effort HEART: regular rate & rhythm and no murmurs and no lower extremity edema ABDOMEN:abdomen soft, non-tender and normal bowel sounds Musculoskeletal:no cyanosis of  digits and no clubbing  PSYCH: alert & oriented x 3 with fluent speech NEURO: no focal motor/sensory deficits  RADIOGRAPHIC STUDIES: I have personally reviewed the radiological images as listed and agreed with the findings in the report. No results found.  ASSESSMENT & PLAN:  No problem-specific Assessment & Plan notes found for this encounter.   All questions were answered. The patient knows to call the clinic with any problems, questions or concerns.  The total time spent in the appointment was {CHL ONC TIME VISIT - ZRAQT:[id number]} encounter with patients including review of chart and various tests results, discussions about plan of care and coordination of care plan  Heath Lark, MD 11/15/20212:23 PM       Heath Lark, MD 02/23/20 2:23 PM

## 2020-02-24 ENCOUNTER — Telehealth: Payer: Self-pay | Admitting: Hematology and Oncology

## 2020-02-24 ENCOUNTER — Inpatient Hospital Stay: Payer: Managed Care, Other (non HMO) | Admitting: Hematology and Oncology

## 2020-02-24 NOTE — Telephone Encounter (Signed)
Received a call from Ms. Linskey to reschedule her new hem appt to 12/1 at 8am to see [name]. Pt aware to arrive 15 minutes early.

## 2020-02-25 ENCOUNTER — Telehealth: Payer: Self-pay | Admitting: Hematology and Oncology

## 2020-02-25 NOTE — Telephone Encounter (Signed)
Pt has been rescheduled to see [name] on 12/1 to 10am. Ms. Cabacungan confirmed the new appt time

## 2020-03-07 NOTE — Progress Notes (Signed)
[facility] CONSULT NOTE  Patient Care Team: Patient, [name] as PCP - General (General Practice)  CHIEF COMPLAINTS/PURPOSE OF CONSULTATION:   Iron deficiency anemia.  ASSESSMENT & PLAN:  No problem-specific Assessment & Plan notes found for this encounter.  Orders Placed This Encounter  Procedures  . CBC with Differential/Platelet    Standing Status:   Standing    Number of Occurrences:   22    Standing Expiration Date:   03/10/2021  . Iron and TIBC    Standing Status:   Future    Standing Expiration Date:   03/10/2021  . Ferritin    Standing Status:   Future    Standing Expiration Date:   03/10/2021   This is a very pleasant 44 year old female patient with past medical history significant for Crohn's disease, iron deficiency anemia, uterine fibroids, menorrhagia referred to hematology for evaluation and recommendations regarding iron deficiency anemia.  She was previously diagnosed with iron deficiency and took iron supplementation for a while and discontinued it given her improvement in iron.  She most recently was asked to restart it but she has only been taking it couple times a week.  Review of systems only pertinent for fatigue, otherwise feeling well.  Physical examination, healthy appearing female patient, no palpable lymphadenopathy or hepatosplenomegaly. 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 Since with regards to her fibroids, she is still interested in childbearing hence she is following up with her gynecologist.   I recommended that she start oral iron supplementation, ferrous sulfate 325 mg at least twice a day since she is tolerating it very well.  She will repeat labs in 8 weeks before her next visit with me.  If she does not have adequate response with oral iron iron despite her compliance then we will consider intravenous iron supplementation.  She is agreeable to this  plan. She is already doing mammograms regularly. Thank you for consulting [place] in the care of this patient.  Please do not hesitate to contact us with any additional questions or concerns.  Labs will be faxed to pt's lab of choice.  HISTORY OF PRESENTING ILLNESS:   TEMEKIA CASKEY 44 y.o. 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  This is a very pleasant 44 year old female patient with past medical history significant for iron deficiency anemia secondary to menorrhagia, Crohn's disease status post total colectomy, uterine fibroids, vitamin D deficiency referred to hematology for evaluation and recommendations regarding iron deficiency anemia.  Ms. Jamielyn arrived to the appointment today by herself.  She is a good historian.  She tells me that she has heavy menstrual cycles because of her fibroids and has had intermittent iron deficiency.  She took iron supplementation last year and she had improvement in her numbers so she was told to discontinue it.  She denies any hematochezia or melena since she had total colectomy.  Bowel habits are normal.  No other source of blood loss.  She is not a vegetarian.  No dietary restrictions.  She feels well except for some fatigue and sleepiness from time to time.  No shortness of breath with exertion, pica, brittle nails, hair loss or difficulty swallowing.  She has been taking ferrous sulfate 65 mg once every other day or so, when she remembers it.  She has not taken it regularly.  She has no side effects from iron supplementation.  Rest of the pertinent 10 point ROS reviewed and negative.  REVIEW OF SYSTEMS:   Constitutional: Denies fevers, chills or abnormal night sweats Eyes: Denies blurriness of vision, double vision or watery eyes Ears, nose, mouth, throat, and face: Denies mucositis or sore throat Respiratory: Denies cough, dyspnea or wheezes Cardiovascular: Denies palpitation, chest discomfort or lower extremity  swelling Gastrointestinal:  Denies nausea, heartburn or change in bowel habits Skin: Denies abnormal skin rashes Lymphatics: Denies new lymphadenopathy or easy bruising Neurological:Denies numbness, tingling or new weaknesses Behavioral/Psych: Mood is stable, no new changes  All other systems were reviewed with the patient and are negative.  MEDICAL HISTORY:  Past Medical History:  Diagnosis Date  . Colon stricture ([place]) 05/07/2019  . Crohn's disease ([place])   . Diarrhea   . 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 Rectal bleeding   . Uterine fibroid   . Vitamin D deficiency 09/24/2013    SURGICAL HISTORY: Past Surgical History:  Procedure Laterality Date  . COLECTOMY  06/27/2019  . COLONOSCOPY  2015    SOCIAL HISTORY: Social History   Socioeconomic History  . Marital status: Single    Spouse name: Not on file  . Number of children: 1  . Years of education: 48  . Highest education level: Not on file  Occupational History  . Occupation: Medical Coder   Tobacco Use  . Smoking status: Former Smoker    Types: Cigarettes  . Smokeless tobacco: Never Used  Vaping Use  . Vaping Use: Never used  Substance and Sexual Activity  . Alcohol use: Yes    Alcohol/week: 1.0 - 2.0 standard drink    Types: 1 - 2 Glasses of wine per week  . Drug use: No  . Sexual activity: Yes    Partners: Male    Comment: Intermittent protection not consistent  Other Topics Concern  . Not on file  Social History Narrative   Single, 1 daughter born 2001 approximately   Medical coder for [employer]   She is a former smoker, occasional alcohol no drug use   Social Determinants of Radio broadcast assistant Strain:   . Difficulty of Paying Living Expenses: Not on file  Food Insecurity:   . Worried About Charity fundraiser in the Last Year: Not on file  . Ran Out of Food in the Last Year: Not on file  Transportation Needs:   . Lack of Transportation  (Medical): Not on file  . Lack of Transportation (Non-Medical): Not on file  Physical Activity:   . Days of Exercise per Week: Not on file  . Minutes of Exercise per Session: Not on file  Stress:   . Feeling of Stress : Not on file  Social Connections:   . Frequency of Communication with Friends and Family: Not on file  . Frequency of Social Gatherings with Friends and Family: Not on file  . Attends Religious Services: Not on file  . Active Member of Clubs or Organizations: Not on file  . Attends Archivist Meetings: Not on file  . Marital Status: Not on file  Intimate Partner Violence:   . Fear of Current or Ex-Partner: Not on file  . Emotionally Abused: Not on file  . Physically Abused: Not on file  . Sexually Abused: Not on file    FAMILY HISTORY: Family History  Problem Relation Age of Onset  . Breast cancer Maternal Aunt   . Diabetes Father   .  Diabetes Paternal Aunt   . Esophageal cancer Neg Hx   . Rectal cancer Neg Hx   . Stomach cancer Neg Hx   . Colon polyps Neg Hx   . Colon cancer Neg Hx      ALLERGIES:  is allergic to other.  MEDICATIONS:  Current Outpatient Medications  Medication Sig Dispense Refill  . ferrous sulfate 325 (65 FE) MG tablet Take by mouth.    . Multiple Vitamin (MULTIVITAMIN) tablet Take 1 tablet by mouth daily.     No current facility-administered medications for this visit.     PHYSICAL EXAMINATION: ECOG PERFORMANCE STATUS: 0 - Asymptomatic  Vitals:   03/10/20 0959  BP: (!) 138/100  Pulse: (!) 105  Temp: 98.7 F (37.1 C)  SpO2: 100%   Filed Weights   03/10/20 0959  Weight: 157 lb (71.2 kg)    GENERAL:alert, no distress and comfortable SKIN: skin color, texture, turgor are normal, no rashes or significant lesions EYES: normal, conjunctiva are pink and non-injected, sclera clear OROPHARYNX:no exudate, no erythema and lips, buccal mucosa, and tongue normal  NECK: supple, thyroid normal size, non-tender, without  nodularity LYMPH:  no palpable lymphadenopathy in the cervical, axillary or inguinal LUNGS: clear to auscultation and percussion with normal breathing effort HEART: regular rate & rhythm and no murmurs and no lower extremity edema ABDOMEN:abdomen soft, non-tender and normal bowel sounds Musculoskeletal:no cyanosis of digits and no clubbing  PSYCH: alert & oriented x 3 with fluent speech NEURO: no focal motor/sensory deficits  LABORATORY DATA:  I have reviewed the data as listed Lab Results  Component Value Date   WBC 9.2 01/21/2020   HGB 10.4 (L) 01/21/2020   HCT 33.9 (L) 01/21/2020   MCV 71 (L) 01/21/2020   PLT 346 01/21/2020     Chemistry      Component Value Date/Time   NA 135 01/21/2020 1056   K 3.9 01/21/2020 1056   CL 101 01/21/2020 1056   CO2 22 01/21/2020 1056   BUN 12 01/21/2020 1056   CREATININE 0.78 01/21/2020 1056      Component Value Date/Time   CALCIUM 9.4 01/21/2020 1056   ALKPHOS 75 06/24/2019 0852   AST 13 (L) 06/24/2019 0852   ALT 10 06/24/2019 0852   BILITOT 0.5 06/24/2019 0852     Reviewed labs, microcytic hypochromic anemia, normal WBC count and normal platelet count. Ferritin of 6.   RADIOGRAPHIC STUDIES: I have personally reviewed the radiological images as listed and agreed with the findings in the report. I have spent a total of 30 minutes in the care of this patient including history and physical, review of medical records, counseling and coordination of care All questions were answered. The patient knows to call the clinic with any problems, questions or concerns.     Benay Pike, MD 03/10/2020 10:38 AM

## 2020-03-10 ENCOUNTER — Inpatient Hospital Stay: Payer: Managed Care, Other (non HMO)

## 2020-03-10 ENCOUNTER — Inpatient Hospital Stay: Payer: Managed Care, Other (non HMO) | Attending: Hematology and Oncology | Admitting: Hematology and Oncology

## 2020-03-10 ENCOUNTER — Encounter: Payer: Self-pay | Admitting: Hematology and Oncology

## 2020-03-10 ENCOUNTER — Telehealth: Payer: Self-pay | Admitting: Hematology and Oncology

## 2020-03-10 ENCOUNTER — Other Ambulatory Visit: Payer: Self-pay

## 2020-03-10 VITALS — BP 138/100 | HR 105 | Temp 98.7°F | Ht 63.0 in | Wt 157.0 lb

## 2020-03-10 DIAGNOSIS — K5090 Crohn's disease, unspecified, without complications: Secondary | ICD-10-CM | POA: Insufficient documentation

## 2020-03-10 DIAGNOSIS — D500 Iron deficiency anemia secondary to blood loss (chronic): Secondary | ICD-10-CM | POA: Insufficient documentation

## 2020-03-10 DIAGNOSIS — N920 Excessive and frequent menstruation with regular cycle: Secondary | ICD-10-CM | POA: Insufficient documentation

## 2020-03-10 NOTE — Telephone Encounter (Signed)
Scheduled appointment per 12/1 los. Spoke to patient who is aware of appointment date and time. No labs scheduled. Patient will have labs done at outside source. Nurse is aware of patient's decision for labs.

## 2020-04-30 ENCOUNTER — Other Ambulatory Visit: Payer: Self-pay | Admitting: Obstetrics and Gynecology

## 2020-04-30 DIAGNOSIS — N6310 Unspecified lump in the right breast, unspecified quadrant: Secondary | ICD-10-CM

## 2020-05-07 ENCOUNTER — Telehealth: Payer: Self-pay

## 2020-05-07 NOTE — Telephone Encounter (Signed)
After the Patients last visit with [name] she had requested to go to [place] in order to have her CBC w/ Diff/Platelet, Iron & TIBC, and Ferritin drawn. The patient called today requesting that the orders be faxed over to [facility] on [facility]. I faxed over the above lab order requisitions to the listed [facility] at [phone] and notified the Patient. Patient verbalized understanding. I advised the Patient to contact me if there were any issues.

## 2020-05-12 ENCOUNTER — Other Ambulatory Visit: Payer: Self-pay

## 2020-05-12 ENCOUNTER — Inpatient Hospital Stay: Payer: Managed Care, Other (non HMO) | Attending: Hematology and Oncology | Admitting: Hematology and Oncology

## 2020-05-12 VITALS — BP 150/101 | HR 102 | Temp 97.7°F | Resp 18 | Ht 63.0 in | Wt 160.8 lb

## 2020-05-12 DIAGNOSIS — K5090 Crohn's disease, unspecified, without complications: Secondary | ICD-10-CM | POA: Insufficient documentation

## 2020-05-12 DIAGNOSIS — D500 Iron deficiency anemia secondary to blood loss (chronic): Secondary | ICD-10-CM

## 2020-05-12 DIAGNOSIS — N920 Excessive and frequent menstruation with regular cycle: Secondary | ICD-10-CM | POA: Insufficient documentation

## 2020-05-12 NOTE — Progress Notes (Unsigned)
[facility] CONSULT NOTE  Patient Care Team: Patient, [name] as PCP - General (General Practice)  CHIEF COMPLAINTS/PURPOSE OF CONSULTATION:   Iron deficiency anemia.  ASSESSMENT & PLAN:  No problem-specific Assessment & Plan notes found for this encounter.  No orders of the defined types were placed in this encounter.  This is a very pleasant 45 year old female patient with past medical history significant for Crohn's disease, iron deficiency anemia, uterine fibroids, menorrhagia referred to hematology for evaluation and recommendations regarding iron deficiency anemia.  She was previously diagnosed with iron deficiency and took iron supplementation for a while and discontinued it given her improvement in iron.  She most recently was asked to restart it but she has only been taking it couple times a week.  Review of systems only pertinent for fatigue, otherwise feeling well.  Physical examination, healthy appearing female patient, no palpable lymphadenopathy or hepatosplenomegaly. 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 with regards to her fibroids, she is still interested in childbearing hence she is following up with her gynecologist.   I recommended that she start oral iron supplementation, ferrous sulfate 325 mg at least twice a day since she is tolerating it very well.  She will repeat labs in 8 weeks before her next visit with me.  If she does not have adequate response with oral iron iron despite her compliance then we will consider intravenous iron supplementation.  She is agreeable to this plan.  She is here for FU after oral iron. She has felt some improvement in energy, unless she is having menstrual cycles when she feels weak She has been compliant with iron, no complications. She did her labs recently, but we dont have access to it. We will review labs when available, if no  improvement with oral iron, we will arrange for IV iron. RTC in 4 months otherwise.  She is already doing mammograms regularly.  Thank you for consulting [place] in the care of this patient.  Please do not hesitate to contact us with any additional questions or concerns.   HISTORY OF PRESENTING ILLNESS:   [name] 45 y.o. 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  This is a very pleasant 45 year old female patient with past medical history significant for iron deficiency anemia secondary to menorrhagia, Crohn's disease status post total colectomy, uterine fibroids, vitamin D deficiency referred to hematology for evaluation and recommendations regarding iron deficiency anemia.   She has started oral iron supplementation about 8 weeks ago and is here for FU. She is compliant and is tolerating oral iron well. She feels tired right after her menstrual cycle, but otherwise no major issues.  REVIEW OF SYSTEMS:   Constitutional: Denies fevers, chills or abnormal night sweats Eyes: Denies blurriness of vision, double vision or watery eyes Ears, nose, mouth, throat, and face: Denies mucositis or sore throat Respiratory: Denies cough, dyspnea or wheezes Cardiovascular: Denies palpitation, chest discomfort or lower extremity swelling Gastrointestinal:  Denies nausea, heartburn or change in bowel habits Skin: Denies abnormal skin rashes Lymphatics: Denies new lymphadenopathy or easy bruising Neurological:Denies numbness, tingling or new weaknesses Behavioral/Psych: Mood is stable, no new changes  All other systems were reviewed with the patient and are negative.  MEDICAL HISTORY:  Past Medical History:  Diagnosis Date  . 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 Crohn's disease ([place])   . Diarrhea   .  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 Rectal bleeding   . Uterine fibroid   . Vitamin D deficiency 09/24/2013    SURGICAL  HISTORY: Past Surgical History:  Procedure Laterality Date  . COLECTOMY  06/27/2019  . COLONOSCOPY  2015    SOCIAL HISTORY: Social History   Socioeconomic History  . Marital status: Single    Spouse name: Not on file  . Number of children: 1  . Years of education: 74  . Highest education level: Not on file  Occupational History  . Occupation: Medical Coder   Tobacco Use  . Smoking status: Former Smoker    Types: Cigarettes  . Smokeless tobacco: Never Used  Vaping Use  . Vaping Use: Never used  Substance and Sexual Activity  . Alcohol use: Yes    Alcohol/week: 1.0 - 2.0 standard drink    Types: 1 - 2 Glasses of wine per week  . Drug use: No  . Sexual activity: Yes    Partners: Male    Comment: Intermittent protection not consistent  Other Topics Concern  . Not on file  Social History Narrative   Single, 1 daughter born 2001 approximately   Medical coder for [employer]   She is a former smoker, occasional alcohol no drug use   Social Determinants of Radio broadcast assistant Strain: Not on file  Food Insecurity: Not on file  Transportation Needs: Not on file  Physical Activity: Not on file  Stress: Not on file  Social Connections: Not on file  Intimate Partner Violence: Not on file    FAMILY HISTORY: Family History  Problem Relation Age of Onset  . Breast cancer Maternal Aunt   . Diabetes Father   . Diabetes Paternal Aunt   . Esophageal cancer Neg Hx   . Rectal cancer Neg Hx   . Stomach cancer Neg Hx   . Colon polyps Neg Hx   . Colon cancer Neg Hx      ALLERGIES:  is allergic to other.  MEDICATIONS:  Current Outpatient Medications  Medication Sig Dispense Refill  . ferrous sulfate 325 (65 FE) MG tablet Take by mouth.    . Multiple Vitamin (MULTIVITAMIN) tablet Take 1 tablet by mouth daily.     No current facility-administered medications for this visit.     PHYSICAL EXAMINATION: ECOG PERFORMANCE STATUS: 0 - Asymptomatic  Vitals:   05/12/20  1510  BP: (!) 150/101  Pulse: (!) 102  Resp: 18  Temp: 97.7 F (36.5 C)  SpO2: 100%   Filed Weights   05/12/20 1510  Weight: 160 lb 12.8 oz (72.9 kg)    GENERAL:alert, no distress and comfortable SKIN: skin color, texture, turgor are normal, no rashes or significant lesions EYES: normal, conjunctiva are pink and non-injected, sclera clear OROPHARYNX:no exudate, no erythema and lips, buccal mucosa, and tongue normal  NECK: supple, thyroid normal size, non-tender, without nodularity LYMPH:  no palpable lymphadenopathy in the cervical, axillary or inguinal LUNGS: clear to auscultation and percussion with normal breathing effort HEART: regular rate & rhythm and no murmurs and no lower extremity edema ABDOMEN:abdomen soft, non-tender and normal bowel sounds Musculoskeletal:no cyanosis of digits and no clubbing  PSYCH: alert & oriented x 3 with fluent speech NEURO: no focal motor/sensory deficits  LABORATORY DATA:  I have reviewed the data as listed Lab Results  Component Value Date   WBC 9.2 01/21/2020   HGB 10.4 (L) 01/21/2020   HCT 33.9 (L)  01/21/2020   MCV 71 (L) 01/21/2020   PLT 346 01/21/2020     Chemistry      Component Value Date/Time   NA 135 01/21/2020 1056   K 3.9 01/21/2020 1056   CL 101 01/21/2020 1056   CO2 22 01/21/2020 1056   BUN 12 01/21/2020 1056   CREATININE 0.78 01/21/2020 1056      Component Value Date/Time   CALCIUM 9.4 01/21/2020 1056   ALKPHOS 75 06/24/2019 0852   AST 13 (L) 06/24/2019 0852   ALT 10 06/24/2019 0852   BILITOT 0.5 06/24/2019 0852     Reviewed labs, microcytic hypochromic anemia, normal WBC count and normal platelet count.   Labs received from [facility], Hb now over 11 gms and ferritin of 43. This is consistent with response.  RADIOGRAPHIC STUDIES: I have personally reviewed the radiological images as listed and agreed with the findings in the report. I have spent a total of 20 minutes in the care of this patient including  history and physical, review of medical records, counseling and coordination of care All questions were answered. The patient knows to call the clinic with any problems, questions or concerns.     Benay Pike, MD 05/13/2020 8:26 AM

## 2020-05-13 ENCOUNTER — Other Ambulatory Visit: Payer: Self-pay | Admitting: Hematology and Oncology

## 2020-05-13 ENCOUNTER — Encounter: Payer: Self-pay | Admitting: Hematology and Oncology

## 2020-05-13 DIAGNOSIS — D500 Iron deficiency anemia secondary to blood loss (chronic): Secondary | ICD-10-CM

## 2020-05-14 ENCOUNTER — Telehealth: Payer: Self-pay | Admitting: Hematology and Oncology

## 2020-05-14 NOTE — Telephone Encounter (Signed)
Scheduled appt per 2/4 sch msg - pt is aware of new appt scheduled in May

## 2020-06-02 ENCOUNTER — Ambulatory Visit
Admission: RE | Admit: 2020-06-02 | Discharge: 2020-06-02 | Disposition: A | Discharge status: Home / Self Care | Payer: Managed Care, Other (non HMO) | Source: Ambulatory Visit | Attending: Obstetrics and Gynecology | Admitting: Obstetrics and Gynecology

## 2020-06-02 ENCOUNTER — Other Ambulatory Visit: Payer: Self-pay

## 2020-06-02 ENCOUNTER — Other Ambulatory Visit: Payer: Self-pay | Admitting: Obstetrics and Gynecology

## 2020-06-02 DIAGNOSIS — N6310 Unspecified lump in the right breast, unspecified quadrant: Secondary | ICD-10-CM

## 2020-06-03 ENCOUNTER — Ambulatory Visit
Admission: RE | Admit: 2020-06-03 | Discharge: 2020-06-03 | Disposition: A | Discharge status: Home / Self Care | Payer: Managed Care, Other (non HMO) | Source: Ambulatory Visit | Attending: Obstetrics and Gynecology | Admitting: Obstetrics and Gynecology

## 2020-06-03 ENCOUNTER — Other Ambulatory Visit: Payer: Self-pay | Admitting: Obstetrics and Gynecology

## 2020-06-03 DIAGNOSIS — N6310 Unspecified lump in the right breast, unspecified quadrant: Secondary | ICD-10-CM

## 2020-06-04 ENCOUNTER — Other Ambulatory Visit: Payer: Self-pay | Admitting: Obstetrics and Gynecology

## 2020-06-04 ENCOUNTER — Other Ambulatory Visit: Payer: Self-pay | Admitting: *Deleted

## 2020-06-04 DIAGNOSIS — C50411 Malignant neoplasm of upper-outer quadrant of right female breast: Secondary | ICD-10-CM | POA: Insufficient documentation

## 2020-06-04 DIAGNOSIS — Z170 Estrogen receptor positive status [ER+]: Secondary | ICD-10-CM | POA: Insufficient documentation

## 2020-06-04 DIAGNOSIS — C50919 Malignant neoplasm of unspecified site of unspecified female breast: Secondary | ICD-10-CM

## 2020-06-07 ENCOUNTER — Ambulatory Visit
Admission: RE | Admit: 2020-06-07 | Discharge: 2020-06-07 | Disposition: A | Discharge status: Home / Self Care | Payer: Managed Care, Other (non HMO) | Source: Ambulatory Visit | Attending: Obstetrics and Gynecology | Admitting: Obstetrics and Gynecology

## 2020-06-07 ENCOUNTER — Other Ambulatory Visit: Payer: Self-pay | Admitting: Hematology and Oncology

## 2020-06-07 ENCOUNTER — Other Ambulatory Visit: Payer: Self-pay

## 2020-06-07 DIAGNOSIS — C50919 Malignant neoplasm of unspecified site of unspecified female breast: Secondary | ICD-10-CM

## 2020-06-07 NOTE — Progress Notes (Signed)
Tried calling her, no answer. I will talk to her on Wednesday.

## 2020-06-08 ENCOUNTER — Encounter: Payer: Self-pay | Admitting: *Deleted

## 2020-06-08 ENCOUNTER — Other Ambulatory Visit: Payer: Self-pay | Admitting: *Deleted

## 2020-06-08 ENCOUNTER — Other Ambulatory Visit: Payer: Self-pay | Admitting: Genetic Counselor

## 2020-06-08 ENCOUNTER — Other Ambulatory Visit: Payer: Self-pay

## 2020-06-08 ENCOUNTER — Telehealth: Payer: Self-pay | Admitting: *Deleted

## 2020-06-08 ENCOUNTER — Ambulatory Visit
Admission: RE | Admit: 2020-06-08 | Discharge: 2020-06-08 | Disposition: A | Discharge status: Home / Self Care | Payer: Managed Care, Other (non HMO) | Source: Ambulatory Visit | Attending: General Surgery | Admitting: General Surgery

## 2020-06-08 ENCOUNTER — Other Ambulatory Visit: Payer: Self-pay | Admitting: General Surgery

## 2020-06-08 DIAGNOSIS — Z170 Estrogen receptor positive status [ER+]: Secondary | ICD-10-CM

## 2020-06-08 DIAGNOSIS — C50411 Malignant neoplasm of upper-outer quadrant of right female breast: Secondary | ICD-10-CM

## 2020-06-08 MED ORDER — GADOBUTROL 1 MMOL/ML IV SOLN
7.0000 mL | Freq: Once | INTRAVENOUS | Status: AC | PRN
  Administered 2020-06-08: 7 mL via INTRAVENOUS

## 2020-06-08 NOTE — Telephone Encounter (Signed)
Called pt with navigation resources and contact information. Discussed new pt appt tomorrow at [facility] to see [name] and [name]. Informed pt will be coordinating MR bx of left breast per MRI recommendations. Denies further questions at this time. Encourage pt to call with needs.

## 2020-06-09 ENCOUNTER — Inpatient Hospital Stay: Payer: Managed Care, Other (non HMO)

## 2020-06-09 ENCOUNTER — Other Ambulatory Visit: Payer: Self-pay | Admitting: *Deleted

## 2020-06-09 ENCOUNTER — Inpatient Hospital Stay: Payer: Managed Care, Other (non HMO) | Admitting: Genetic Counselor

## 2020-06-09 ENCOUNTER — Ambulatory Visit: Payer: Managed Care, Other (non HMO) | Attending: General Surgery | Admitting: Physical Therapy

## 2020-06-09 ENCOUNTER — Encounter: Payer: Self-pay | Admitting: *Deleted

## 2020-06-09 ENCOUNTER — Encounter: Payer: Self-pay | Admitting: Physical Therapy

## 2020-06-09 ENCOUNTER — Encounter: Payer: Self-pay | Admitting: Hematology and Oncology

## 2020-06-09 ENCOUNTER — Other Ambulatory Visit: Payer: Self-pay | Admitting: General Surgery

## 2020-06-09 ENCOUNTER — Inpatient Hospital Stay: Payer: Managed Care, Other (non HMO) | Attending: Hematology and Oncology | Admitting: Hematology and Oncology

## 2020-06-09 VITALS — BP 156/98 | HR 100 | Temp 97.8°F | Resp 18 | Ht 63.0 in | Wt 154.9 lb

## 2020-06-09 DIAGNOSIS — C50411 Malignant neoplasm of upper-outer quadrant of right female breast: Secondary | ICD-10-CM | POA: Insufficient documentation

## 2020-06-09 DIAGNOSIS — Z170 Estrogen receptor positive status [ER+]: Secondary | ICD-10-CM | POA: Insufficient documentation

## 2020-06-09 DIAGNOSIS — D500 Iron deficiency anemia secondary to blood loss (chronic): Secondary | ICD-10-CM | POA: Diagnosis not present

## 2020-06-09 DIAGNOSIS — K5090 Crohn's disease, unspecified, without complications: Secondary | ICD-10-CM | POA: Diagnosis not present

## 2020-06-09 DIAGNOSIS — Z5112 Encounter for antineoplastic immunotherapy: Secondary | ICD-10-CM | POA: Diagnosis present

## 2020-06-09 DIAGNOSIS — N920 Excessive and frequent menstruation with regular cycle: Secondary | ICD-10-CM | POA: Diagnosis not present

## 2020-06-09 DIAGNOSIS — Z5111 Encounter for antineoplastic chemotherapy: Secondary | ICD-10-CM | POA: Insufficient documentation

## 2020-06-09 DIAGNOSIS — Z803 Family history of malignant neoplasm of breast: Secondary | ICD-10-CM

## 2020-06-09 DIAGNOSIS — C50811 Malignant neoplasm of overlapping sites of right female breast: Secondary | ICD-10-CM | POA: Diagnosis present

## 2020-06-09 DIAGNOSIS — R293 Abnormal posture: Secondary | ICD-10-CM | POA: Insufficient documentation

## 2020-06-09 LAB — CBC WITH DIFFERENTIAL/PLATELET
Abs Immature Granulocytes: 0.01 10*3/uL (ref 0.00–0.07)
Basophils Absolute: 0.1 10*3/uL (ref 0.0–0.1)
Basophils Relative: 1 %
Eosinophils Absolute: 0 10*3/uL (ref 0.0–0.5)
Eosinophils Relative: 0 %
HCT: 40.3 % (ref 36.0–46.0)
Hemoglobin: 12.8 g/dL (ref 12.0–15.0)
Immature Granulocytes: 0 %
Lymphocytes Relative: 15 %
Lymphs Abs: 1.2 10*3/uL (ref 0.7–4.0)
MCH: 26.6 pg (ref 26.0–34.0)
MCHC: 31.8 g/dL (ref 30.0–36.0)
MCV: 83.8 fL (ref 80.0–100.0)
Monocytes Absolute: 0.5 10*3/uL (ref 0.1–1.0)
Monocytes Relative: 6 %
Neutro Abs: 6.1 10*3/uL (ref 1.7–7.7)
Neutrophils Relative %: 78 %
Platelets: 344 10*3/uL (ref 150–400)
RBC: 4.81 MIL/uL (ref 3.87–5.11)
RDW: 15.3 % (ref 11.5–15.5)
WBC: 7.9 10*3/uL (ref 4.0–10.5)
nRBC: 0 % (ref 0.0–0.2)

## 2020-06-09 LAB — COMPREHENSIVE METABOLIC PANEL
ALT: 10 U/L (ref 0–44)
AST: 17 U/L (ref 15–41)
Albumin: 4.4 g/dL (ref 3.5–5.0)
Alkaline Phosphatase: 100 U/L (ref 38–126)
Anion gap: 12 (ref 5–15)
BUN: 7 mg/dL (ref 6–20)
CO2: 24 mmol/L (ref 22–32)
Calcium: 9.6 mg/dL (ref 8.9–10.3)
Chloride: 104 mmol/L (ref 98–111)
Creatinine, Ser: 0.83 mg/dL (ref 0.44–1.00)
GFR, Estimated: >60 mL/min (ref >60)
Glucose, Bld: 93 mg/dL (ref 70–99)
Potassium: 4.5 mmol/L (ref 3.5–5.1)
Sodium: 140 mmol/L (ref 135–145)
Total Bilirubin: 0.5 mg/dL (ref 0.3–1.2)
Total Protein: 8.5 g/dL — ABNORMAL HIGH (ref 6.5–8.1)

## 2020-06-09 LAB — MAGNESIUM: Magnesium: 2.3 mg/dL (ref 1.7–2.4)

## 2020-06-09 LAB — GENETIC SCREENING ORDER

## 2020-06-09 NOTE — Patient Instructions (Signed)

## 2020-06-09 NOTE — Progress Notes (Signed)
START ON PATHWAY REGIMEN - Breast     A cycle is every 21 days:     Pertuzumab      Pertuzumab      Trastuzumab-xxxx      Trastuzumab-xxxx      Carboplatin      Docetaxel   **Always confirm dose/schedule in your pharmacy ordering system**  Patient Characteristics: Preoperative or Nonsurgical Candidate (Clinical Staging), Neoadjuvant Therapy followed by Surgery, Invasive Disease, Chemotherapy, HER2 Positive, ER Positive Therapeutic Status: Preoperative or Nonsurgical Candidate (Clinical Staging) AJCC M Category: cM0 AJCC Grade: G3 Breast Surgical Plan: Neoadjuvant Therapy followed by Surgery ER Status: Positive (+) AJCC 8 Stage Grouping: IIIA HER2 Status: Positive (+) AJCC T Category: cT3 AJCC N Category: cN1 PR Status: Negative (-) Intent of Therapy: Curative Intent, Discussed with Patient

## 2020-06-09 NOTE — Therapy (Signed)
[place], [place], [address] Phone: [phone]   Fax:  [phone]  Physical Therapy Evaluation  Patient Details  Name: [name] MRN: [id number] Date of Birth: March 12, 1976 Referring Provider (PT): Dr. [name]   Encounter Date: 06/09/2020   PT End of Session - 06/09/20 1416    Visit Number 1    Number of Visits 2    Date for PT Re-Evaluation 12/10/20    PT Start Time 8891    PT Stop Time 1112    PT Time Calculation (min) 30 min    Activity Tolerance Patient tolerated treatment well    Behavior During Therapy [facility] for tasks assessed/performed           Past Medical History:  Diagnosis Date  . 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 Crohn's disease ([place])   . Diarrhea   . 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 Rectal bleeding   . Uterine fibroid   . Vitamin D deficiency 09/24/2013    Past Surgical History:  Procedure Laterality Date  . COLECTOMY  06/27/2019  . COLONOSCOPY  2015    There were no vitals filed for this visit.    Subjective Assessment - 06/09/20 1404    Subjective Patient reports she is here at the cancer center to meet with hr medical team for her newly diagnosed right breast cancer.    Pertinent History Patient was diagnosed on 06/04/2020 with right grade III invasive ductal carcinoma breast cancer. It measures 7.9 cm and is located in the upper outer quadrant. It is ER positive, PR negative, and HER2 negative with a Ki67 of 40%.    Patient Stated Goals Prevent lymphedema    Currently in Pain? No/denies              [facility] PT Assessment - 06/09/20 0001      Assessment   Medical Diagnosis Right breast cancer    Referring Provider (PT) Dr. [name]    Onset Date/Surgical Date 06/04/20    Hand Dominance Right    Prior Therapy none      Precautions   Precautions Other (comment)    Precaution  Comments active cancer      Restrictions   Weight Bearing Restrictions No      Balance Screen   Has the patient fallen in the past 6 months No    Has the patient had a decrease in activity level because of a fear of falling?  No    Is the patient reluctant to leave their home because of a fear of falling?  No      Home Environment   Living Environment Private residence    Living Arrangements Children   23 y.o. daughter   Available Help at Discharge Family      Prior Function   Level of Independence Independent    Vocation Full time employment    Biochemist, clinical for [employer] She does some exercises at home including squats and weights      Cognition   Overall Cognitive Status Within Functional Limits for tasks assessed      Posture/Postural Control   Posture/Postural Control Postural limitations    Postural Limitations Rounded Shoulders;Forward head      ROM / Strength   AROM / PROM / Strength AROM;Strength      AROM   Overall AROM Comments Cervical AROM is WNL  AROM Assessment Site Shoulder    Right/Left Shoulder Right;Left    Right Shoulder Extension 42 Degrees    Right Shoulder Flexion 147 Degrees    Right Shoulder ABduction 170 Degrees    Right Shoulder Internal Rotation 75 Degrees    Right Shoulder External Rotation 90 Degrees    Left Shoulder Extension 52 Degrees    Left Shoulder Flexion 162 Degrees    Left Shoulder ABduction 178 Degrees    Left Shoulder Internal Rotation 68 Degrees    Left Shoulder External Rotation 77 Degrees      Strength   Overall Strength Within functional limits for tasks performed             LYMPHEDEMA/ONCOLOGY QUESTIONNAIRE - 06/09/20 0001      Type   Cancer Type Right breast cancer      Lymphedema Assessments   Lymphedema Assessments Upper extremities      Right Upper Extremity Lymphedema   10 cm Proximal to Olecranon Process 27.4 cm    Olecranon Process 23.3 cm    10 cm Proximal to Ulnar  Styloid Process 20.2 cm    Just Proximal to Ulnar Styloid Process 15.2 cm    Across Hand at [employer] 17.7 cm    At [place] of 2nd Digit 6.1 cm      Left Upper Extremity Lymphedema   10 cm Proximal to Olecranon Process 28 cm    Olecranon Process 24.2 cm    10 cm Proximal to Ulnar Styloid Process 19.9 cm    Just Proximal to Ulnar Styloid Process 15.2 cm    Across Hand at [employer] 18.5 cm    At [place] of 2nd Digit 6.1 cm           L-DEX FLOWSHEETS - 06/09/20 1400      L-DEX LYMPHEDEMA SCREENING   Measurement Type Unilateral    L-DEX MEASUREMENT EXTREMITY Upper Extremity    POSITION  Standing    DOMINANT SIDE Right    At Risk Side Right    BASELINE SCORE (UNILATERAL) -4.4           The patient was assessed using the L-Dex machine today to produce a lymphedema index baseline score. The patient will be reassessed on a regular basis (typically every 3 months) to obtain new L-Dex scores. If the score is > 6.5 points away from his/her baseline score indicating onset of subclinical lymphedema, it will be recommended to wear a compression garment for 4 weeks, 12 hours per day and then be reassessed. If the score continues to be > 6.5 points from baseline at reassessment, we will initiate lymphedema treatment. Assessing in this manner has a 95% rate of preventing clinically significant lymphedema.      Katina Dung - 06/09/20 0001    Open a tight or new jar No difficulty    Do heavy household chores (wash walls, wash floors) No difficulty    Carry a shopping bag or briefcase No difficulty    Wash your back No difficulty    Use a knife to cut food No difficulty    Recreational activities in which you take some force or impact through your arm, shoulder, or hand (golf, hammering, tennis) No difficulty    During the past week, to what extent has your arm, shoulder or hand problem interfered with your normal social activities with family, friends, neighbors, or groups? Not at all     During the past week, to what extent has  your arm, shoulder or hand problem limited your work or other regular daily activities Not at all    Arm, shoulder, or hand pain. None    Tingling (pins and needles) in your arm, shoulder, or hand None    Difficulty Sleeping No difficulty    DASH Score 0 %            Objective measurements completed on examination: See above findings.         Patient was instructed today in a home exercise program today for post op shoulder range of motion. These included active assist shoulder flexion in sitting, scapular retraction, wall walking with shoulder abduction, and hands behind head external rotation.  She was encouraged to do these twice a day, holding 3 seconds and repeating 5 times when permitted by her physician.          PT Education - 06/09/20 1414    Education Details Lymphedema education and HEP    Person(s) Educated Patient;Child(ren)    Methods Explanation;Demonstration;Handout    Comprehension Returned demonstration;Verbalized understanding               PT Long Term Goals - 06/09/20 1421      PT LONG TERM GOAL #1   Title Patient will demonstate she has regained full shoulder ROM and function post operatively compared to baselines.    Time 6    Period Months    Status New    Target Date 12/10/20           Breast Clinic Goals - 06/09/20 1420      Patient will be able to verbalize understanding of pertinent lymphedema risk reduction practices relevant to her diagnosis specifically related to skin care.   Time 1    Period Days    Status Achieved      Patient will be able to return demonstrate and/or verbalize understanding of the post-op home exercise program related to regaining shoulder range of motion.   Time 1    Period Days    Status Achieved      Patient will be able to verbalize understanding of the importance of attending the postoperative After Breast Cancer Class for further lymphedema risk reduction  education and therapeutic exercise.   Time 1    Period Days    Status Achieved                 Plan - 06/09/20 1417    Clinical Impression Statement Patient was diagnosed on 06/04/2020 with right grade III invasive ductal carcinoma breast cancer. It measures 7.9 cm and is located in the upper outer quadrant. It is ER positive, PR negative, and HER2 negative with a Ki67 of 40%. Her medical team met prior to her assessments to determine a recommended treatment plan. She is planning ot have neoadjuvant chemotherapy followed by a right lumpectomy or mastectomy with a targeted axillary lymph node dissection, radiation, and Tamoxifen. She will benefit from a post op PT reassesment to determine needs and from L-Dex screens every 3 months to detect subclinical lymphedema.    Stability/Clinical Decision Making Stable/Uncomplicated    Clinical Decision Making Low    Rehab Potential Excellent    PT Frequency --   Eval and 1 f/u visit   PT Treatment/Interventions ADLs/Self Care Home Management;Therapeutic exercise;Patient/family education    PT Next Visit Plan Will reassess 3-4 weeks post op    PT Home Exercise Plan Post op shoulder ROM HEP    Consulted and Agree  with Plan of Care Patient    Family Member Consulted Daughter           Patient will benefit from skilled therapeutic intervention in order to improve the following deficits and impairments:  Postural dysfunction,Decreased range of motion,Decreased knowledge of precautions,Impaired UE functional use,Pain  Visit Diagnosis: Carcinoma of upper-outer quadrant of right breast in female, estrogen receptor positive ([place]) - Plan: PT plan of care cert/re-cert  Abnormal posture - Plan: PT plan of care cert/re-cert   Patient will follow up at outpatient cancer rehab 3-4 weeks following surgery.  If the patient requires physical therapy at that time, a specific plan will be dictated and sent to the referring physician for approval. The patient  was educated today on appropriate basic range of motion exercises to begin post operatively and the importance of attending the After Breast Cancer class following surgery.  Patient was educated today on lymphedema risk reduction practices as it pertains to recommendations that will benefit the patient immediately following surgery.  She verbalized good understanding.      Problem List Patient Active Problem List   Diagnosis Date Noted  . Malignant neoplasm of upper-outer quadrant of right breast in female, estrogen receptor positive ([place]) 06/04/2020  . Abnormal finding present on diagnostic imaging of uterus 05/07/2019  . Vitamin D deficiency 09/24/2013  . Crohn's disease of small and large intestines with complication ([place]) 72/76/1848  . 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 10/17/2010   Annia Friendly, PT 06/09/20 2:24 PM  [place], [place], [address] Phone: [phone]   Fax:  [phone]  Name: [name] MRN: [id number] Date of Birth: 1975-10-22

## 2020-06-09 NOTE — Progress Notes (Addendum)
[place] CONSULT NOTE  Patient Care Team: Patient, [name] as PCP - General (General Practice) [name], RN as Oncology Nurse Navigator [name], RN as Oncology Nurse Navigator  CHIEF COMPLAINTS/PURPOSE OF CONSULTATION:   New diagnosis of breast cancer.  ASSESSMENT & PLAN:  No problem-specific Assessment & Plan notes found for this encounter.  Orders Placed This Encounter  Procedures  . CBC with Differential/Platelet    Standing Status:   Standing    Number of Occurrences:   22    Standing Expiration Date:   06/09/2021  . Comprehensive metabolic panel    Standing Status:   Standing    Number of Occurrences:   33    Standing Expiration Date:   06/09/2021  . Magnesium    Standing Status:   Standing    Number of Occurrences:   33    Standing Expiration Date:   06/09/2021   Assessment   This is a very pleasant 45 year old female patient with past medical history significant for Crohn's disease, iron deficiency anemia, uterine fibroids, menorrhagia referred to hematology initially for evaluation and recommendations regarding iron deficiency anemia.  She recently complained of palpable mass in her right breast which prompted a mammogram. Her imaging and pathology suggested contiguous masses in the right breast measuring 7.9 cms in greatest dimension. LN positive for involvement. Systemic staging pending. No concern for brain involvement from history, so will not proceed with MR brain.  PLAN  If no evidence of metastatic disease, we have discussed neoadjuvant chemotherapy with TCHP every 21 days for 6 cycles followed by surgery followed by maintenance herceptin based therapy or Kadcyla depending on the response. Antiestrogens to follow later in the course.  Discussed adverse effects including but not limited to fatigue, nausea, vomiting, diarrhea, cardiotoxicity with Herceptin which is reversible in most patients, neuropathy which can occasionally be  permanent after completion of chemotherapy, increased risk of infections etc.  Typically in HER-2 amplified patients with weakly ER staining and PR negative, we tend to see excellent response to neoadjuvant chemotherapy.  Discussed the schedule of chemotherapy in detail with the patient, she would like to start infusion on the 18th. Discussed that we will need maintenance Herceptin based therapy for a total of 1 year.  We have not addressed antiestrogen therapy on this visit, patient is already very overwhelmed. She will come back for follow-up to see me on March 17 in preparation for chemotherapy on March 18 provided staging is complete and no evidence of metastasis.  We have not really focused on iron deficiency anemia on this visit given extensive information that was discussed today.  Genetic testing scheduled for today given young age at the time of diagnosis All labs will [facility] according to patient. Standing orders printed and handed over to the patient. She understands that results need to be available at the time of the visit.  Thank you for consulting [place] in the care of this patient.  Please do not hesitate to contact us with any additional questions or concerns.  HISTORY OF PRESENTING ILLNESS:   [name] 45 y.o. 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  This is a very pleasant 45 year old female patient with past medical history significant for iron deficiency anemia secondary to menorrhagia, Crohn's disease status post total colectomy, uterine fibroids, vitamin D deficiency referred to hematology for evaluation and recommendations regarding iron deficiency anemia. She then noticed a breast  mass in her right breast which prompted a mammogram.  06/02/2020, Mammo and [place] Numerous concerning hypoechoic masses in the lateral aspect of the right breast. Enlarged right axillary lymph node.  06/03/2020, US guided biopsy of the right breast 9 0 clock  mass showed grade III IDC Pathology from right breast 10 0 clock mass showed grade III IDC  Prognostics showed ER 40% positive, weak staining, PR positive 0 %, Her 2 3 +, Ki 67 30% MRI breast showed contiguous masses in the UPPER-OUTER and LOWER-OUTER quadrants of the RIGHT breast, spanning 7.9 centimeters in greatest dimension. 2. Enhancement extends to the RIGHT nipple, suspicious for nipple and/or retroareolar involvement. 3. Significant skin thickening and skin enhancement of the RIGHT breast. 4. Enlarged RIGHT axillary lymph node with tissue marker clip, consistent with known metastatic disease. 5. Single indeterminate 6 millimeter mass in the LOWER OUTER QUADRANT of the LEFT breast warranting tissue diagnosis. Given the small size, ultrasound correlate is unlikely, and MR guided biopsy is recommended.  Left breast biopsy scheduled for 06/14/2020. She is referred back to medical oncology for consideration of neoadjuvant chemotherapy. She is here for FU with her daughter. She is understandably overwhelmed by the diagnosis. She has met Dr Donne Hazel this AM, he has explained the plan to her as well. She has no major underlying co-morbidities. Very rare headaches associated with premenstrual syndrome otherwise no complaints. No other neurological symtpoms No bone pains. No change in bowel or urinary habits.  REVIEW OF SYSTEMS:    Constitutional: Denies fevers, chills or abnormal night sweats Eyes: Denies blurriness of vision, double vision or watery eyes Ears, nose, mouth, throat, and face: Denies mucositis or sore throat Respiratory: Denies cough, dyspnea or wheezes Cardiovascular: Denies palpitation, chest discomfort or lower extremity swelling Gastrointestinal:  Denies nausea, heartburn or change in bowel habits Skin: Denies abnormal skin rashes Neurological:Denies numbness, tingling or new weaknesses Behavioral/Psych: Mood is stable, no new changes  All other systems were  reviewed with the patient and are negative. Breast: She noticed right breast mass and swelling.  MEDICAL HISTORY:  Past Medical History:  Diagnosis Date  . 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 Crohn's disease ([place])   . Diarrhea   . 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 Rectal bleeding   . Uterine fibroid   . Vitamin D deficiency 09/24/2013    SURGICAL HISTORY: Past Surgical History:  Procedure Laterality Date  . COLECTOMY  06/27/2019  . COLONOSCOPY  2015    SOCIAL HISTORY: Social History   Socioeconomic History  . Marital status: Single    Spouse name: Not on file  . Number of children: 1  . Years of education: 38  . Highest education level: Not on file  Occupational History  . Occupation: Medical Coder   Tobacco Use  . Smoking status: Former Smoker    Types: Cigarettes  . Smokeless tobacco: Never Used  Vaping Use  . Vaping Use: Never used  Substance and Sexual Activity  . Alcohol use: Yes    Alcohol/week: 1.0 - 2.0 standard drink    Types: 1 - 2 Glasses of wine per week  . Drug use: No  . Sexual activity: Yes    Partners: Male    Comment: Intermittent protection not consistent  Other Topics Concern  . Not on file  Social History Narrative   Single, 1 daughter born 2001 approximately   Medical coder for [employer]   She is a former smoker,  occasional alcohol no drug use   Social Determinants of Radio broadcast assistant Strain: Not on file  Food Insecurity: Not on file  Transportation Needs: Not on file  Physical Activity: Not on file  Stress: Not on file  Social Connections: Not on file  Intimate Partner Violence: Not on file    FAMILY HISTORY: Family History  Problem Relation Age of Onset  . Breast cancer Maternal Aunt   . Diabetes Father   . Diabetes Paternal Aunt   . Esophageal cancer Neg Hx   . Rectal cancer Neg Hx   . Stomach cancer Neg Hx   . Colon polyps Neg Hx   . Colon cancer Neg Hx       ALLERGIES:  is allergic to other.  MEDICATIONS:  Current Outpatient Medications  Medication Sig Dispense Refill  . ferrous sulfate 325 (65 FE) MG tablet Take by mouth.    . Multiple Vitamin (MULTIVITAMIN) tablet Take 1 tablet by mouth daily.     No current facility-administered medications for this visit.     PHYSICAL EXAMINATION: ECOG PERFORMANCE STATUS: 0 - Asymptomatic  Vitals:   06/09/20 1347  BP: (!) 156/98  Pulse: 100  Resp: 18  Temp: 97.8 F (36.6 C)  SpO2: 100%     Filed Weights   06/09/20 1347  Weight: 154 lb 14.4 oz (70.3 kg)    GENERAL:alert, no distress and comfortable SKIN: skin color, texture, turgor are normal, no rashes or significant lesions EYES: normal, conjunctiva are pink and non-injected, sclera clear OROPHARYNX:no exudate, no erythema and lips, buccal mucosa, and tongue normal  NECK: supple, thyroid normal size, non-tender, without nodularity LUNGS: clear to auscultation and percussion with normal breathing effort HEART: regular rate & rhythm and no murmurs and no lower extremity edema ABDOMEN:abdomen soft, non-tender and normal bowel sounds Musculoskeletal:no cyanosis of digits and no clubbing  PSYCH: alert & oriented x 3 with fluent speech NEURO: no focal motor/sensory deficits Breast: Right breast visibly larger than left breast. Palpable mass occupying pretty much the entire outer portion of the right breast. Mass measures about 7-8 cms on palpation, movable freely in the breast.Skin over the breast not pinchable in certain areas. Palpable right axillary swelling, but hard to discern how much of this is hematoma from the most recent biopsy. No infraclavicular or other palpable lymphadenopathy No palpable masses in left breast.  LABORATORY DATA:  I have reviewed the data as listed Lab Results  Component Value Date   WBC 7.9 06/09/2020   HGB 12.8 06/09/2020   HCT 40.3 06/09/2020   MCV 83.8 06/09/2020   PLT 344 06/09/2020      Chemistry      Component Value Date/Time   NA 140 06/09/2020 1509   NA 135 01/21/2020 1056   K 4.5 06/09/2020 1509   CL 104 06/09/2020 1509   CO2 24 06/09/2020 1509   BUN 7 06/09/2020 1509   BUN 12 01/21/2020 1056   CREATININE 0.83 06/09/2020 1509      Component Value Date/Time   CALCIUM 9.6 06/09/2020 1509   ALKPHOS 100 06/09/2020 1509   AST 17 06/09/2020 1509   ALT 10 06/09/2020 1509   BILITOT 0.5 06/09/2020 1509     Reviewed labs, microcytic hypochromic anemia, normal WBC count and normal platelet count. Ferritin of 6.   RADIOGRAPHIC STUDIES: I have personally reviewed the radiological images as listed and agreed with the findings in the report. I have spent a total of 45 minutes in the  care of this patient including history and physical, review of medical records, counseling and coordination of care Discussed the MRI results, pathology results, neoadjuvant treatment, side effects of chemotherapy, anticipated prognosis coordinated her imaging, appointments. All questions were answered. The patient knows to call the clinic with any problems, questions or concerns.     Benay Pike, MD 06/09/2020 4:31 PM

## 2020-06-10 ENCOUNTER — Encounter: Payer: Self-pay | Admitting: *Deleted

## 2020-06-10 ENCOUNTER — Other Ambulatory Visit: Payer: Managed Care, Other (non HMO)

## 2020-06-10 LAB — IRON AND TIBC
Iron: 50 ug/dL (ref 41–142)
Saturation Ratios: 16 % — ABNORMAL LOW (ref 21–57)
TIBC: 324 ug/dL (ref 236–444)
UIBC: 273 ug/dL (ref 120–384)

## 2020-06-10 LAB — FERRITIN: Ferritin: 45 ng/mL (ref 11–307)

## 2020-06-11 ENCOUNTER — Encounter: Payer: Self-pay | Admitting: Genetic Counselor

## 2020-06-11 DIAGNOSIS — Z803 Family history of malignant neoplasm of breast: Secondary | ICD-10-CM

## 2020-06-11 HISTORY — DX: Family history of malignant neoplasm of breast: Z80.3

## 2020-06-11 NOTE — Progress Notes (Signed)
The following biosimilar Kanjinti (trastuzumab-anns) has been selected for use in this patient.  Pt can get Neulasta for 30 days at [facility], after that she has to get through specialty Rx for home administration.  Day 3 inj removed from tx plan starting w/ Cycle 3.  Kennith Center, Pharm.D., CPP 06/11/2020@9 :34 AM

## 2020-06-11 NOTE — Progress Notes (Signed)
REFERRING PROVIDER: Benay Pike, MD [name],  [place] [address]  PRIMARY PROVIDER:  No PCP listed  PRIMARY REASON FOR VISIT:  1. Malignant neoplasm of upper-outer quadrant of right breast in female, estrogen receptor positive ([place])   2. Family history of breast cancer     HISTORY OF PRESENT ILLNESS:   [name], a 45 y.o. female, was seen for a [place] cancer genetics consultation at the request of [name] due to a personal and family history of cancer.  [name] presents to clinic today with her daughter to discuss the possibility of a hereditary predisposition to cancer, to discuss genetic testing, and to further clarify her future cancer risks, as well as potential cancer risks for family members.   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 The preliminary treatment plan includes neoadjuvant chemotherapy, surgery, .   CANCER HISTORY:  Oncology History  Malignant neoplasm of upper-outer quadrant of right breast in female, estrogen receptor positive ([place])  06/04/2020 Initial Diagnosis   Malignant neoplasm of upper-outer quadrant of right breast in female, estrogen receptor positive ([place])   06/25/2020 -  Chemotherapy    Patient is on Treatment Plan: BREAST  DOCETAXEL + CARBOPLATIN + TRASTUZUMAB + PERTUZUMAB  (TCHP) Q21D        RISK FACTORS:  Menarche was at age 70 or 47.  First live birth at age 18.  OCP use for approximately 3 years.  Ovaries intact: yes.  Hysterectomy: no.  Menopausal status: premenopausal.  HRT use: 0 years. Colonoscopy: yes Mammogram within the last year: yes. Number of breast biopsies: 1.  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   Crohn's disease ([place])    Diarrhea    Family history of breast cancer 06/11/2020   Iron deficiency  anemia secondary to blood loss (chronic) - Crohn's colitis 10/17/2010   Rectal bleeding    Uterine fibroid    Vitamin D deficiency 09/24/2013    Past Surgical History:  Procedure Laterality Date   COLECTOMY  06/27/2019   COLONOSCOPY  2015    Social History   Socioeconomic History   Marital status: Single    Spouse name: Not on file   Number of children: 1   Years of education: 13   Highest education level: Not on file  Occupational History   Occupation: Medical Coder   Tobacco Use   Smoking status: Former Smoker    Types: Cigarettes   Smokeless tobacco: Never Used  Scientific laboratory technician Use: Never used  Substance and Sexual Activity   Alcohol use: Yes    Alcohol/week: 1.0 - 2.0 standard drink    Types: 1 - 2 Glasses of wine per week   Drug use: No   Sexual activity: Yes    Partners: Male    Comment: Intermittent protection not consistent  Other Topics Concern   Not on file  Social History Narrative   Single, 1 daughter born 2001 approximately   Medical coder for [employer]   She is a former smoker, occasional alcohol no drug use   Social Determinants of Radio broadcast assistant Strain: Not on file  Food Insecurity: Not on file  Transportation Needs: Not on file  Physical Activity: Not on file  Stress: Not on file  Social Connections: Not  on file     FAMILY HISTORY:  We obtained a detailed, 4-generation family history.  Significant diagnoses are listed below: Family History  Problem Relation Age of Onset   Breast cancer Maternal Aunt        dx unknown age   Breast cancer Cousin        maternal cousin; dx unknown age    [name] has one full brother, two maternal half brothers, and six maternal half sisters, all without a history of cancer.  [name] mother passed away before the age of 25 and did not have cancer.  [name] has a maternal aunt and maternal cousin with breast cancer diagnosed at unknown ages.  [name] father is 35  without cancer.  No paternal family history of cancer was reported.   [name] is unaware of previous family history of genetic testing for hereditary cancer risks. There is no reported Ashkenazi Jewish ancestry. There is no known consanguinity.  GENETIC COUNSELING ASSESSMENT: [name] is a 45 y.o. female with a personal and family history of cancer which is somewhat suggestive of a hereditary cancer syndrome and predisposition to cancer given her age of diagnoses and the presence of related cancers in her maternal family. We, therefore, discussed and recommended the following at today's visit.   DISCUSSION: We discussed that 5 - 10% of cancer is hereditary, with most cases of hereditary breast cancer associated with mutations in BRCA1/2.  There are other genes that can be associated with hereditary breast cancer syndromes.  Type of cancer risk and level of risk are gene-specific.  We discussed that testing is beneficial for several reasons including knowing how to follow individuals after completing their treatment, identifying whether potential treatment options would be beneficial, and understanding if other family members could be at risk for cancer and allowing them to undergo genetic testing.   We reviewed the characteristics, features and inheritance patterns of hereditary cancer syndromes. We also discussed genetic testing, including the appropriate family members to test, the process of testing, insurance coverage and turn-around-time for results. We discussed the implications of a negative, positive, carrier and/or variant of uncertain significant result. We recommended [name] pursue genetic testing for a panel that includes genes associated with breast cancer.   [name]  was offered a common hereditary cancer panel (47 genes) and an expanded pan-cancer panel (77 genes). [name] was informed of the benefits and limitations of each panel, including that expanded pan-cancer panels  contain genes that do not have clear management guidelines at this point in time.  We also discussed that as the number of genes included on a panel increases, the chances of variants of uncertain significance increases.  After considering the benefits and limitations of each gene panel, Ms. Cimini  elected to have a common hereditary cancers panel through [place].  The CustomNext-Cancer+RNAinsight panel offered by [facility] includes sequencing and rearrangement analysis for the following 47 genes:  APC, ATM, AXIN2, BARD1, BMPR1A, BRCA1, BRCA2, BRIP1, CDH1, CDK4, CDKN2A, CHEK2, DICER1, EPCAM, GREM1, HOXB13, MEN1, MLH1, MSH2, MSH3, MSH6, MUTYH, NBN, NF1, NF2, NTHL1, PALB2, PMS2, POLD1, POLE, PTEN, RAD51C, RAD51D, RECQL, RET, SDHA, SDHAF2, SDHB, SDHC, SDHD, SMAD4, SMARCA4, STK11, TP53, TSC1, TSC2, and VHL.  RNA data is routinely analyzed for use in variant interpretation for all genes.  Based on Ms. Schweer's personal and family history of breast cancer, she meets medical criteria for genetic testing. Despite that she meets criteria, she may still have an out  of pocket cost. We discussed that if her out of pocket cost for testing is over $100, the laboratory will contact her to discuss patient pay assistance programs and/or self-pay options.   PLAN: After considering the risks, benefits, and limitations, Ms. Vicens provided informed consent to pursue genetic testing and the blood sample was sent to [facility] for analysis of the CustomNext-Cancer +RNAinsight Panel. Results should be available within approximately 3 weeks' time, at which point they will be disclosed by telephone to Ms. Kroner, as will any additional recommendations warranted by these results. Ms. Havener will receive a summary of her genetic counseling visit and a copy of her results once available. This information will also be available in Epic.   Lastly, we encouraged Ms. Longhi to remain in contact with cancer genetics annually so  that we can continuously update the family history and inform her of any changes in cancer genetics and testing that may be of benefit for this family.   Ms. Lapage questions were answered to her satisfaction today. Our contact information was provided should additional questions or concerns arise. Thank you for the referral and allowing [place] to share in the care of your patient.   Nhyira Leano M. Joette Catching, [place], [facility] Genetic Counselor [email or web address] (P) [phone]  The patient was seen for a total of 20 minutes in face-to-face genetic counseling.  Drs. Magrinat, Lindi Adie and/or Burr Medico were available to discuss this case as needed.    _______________________________________________________________________ For Office Staff:  Number of people involved in session: 1 Was an Intern/ student involved with case: no

## 2020-06-14 ENCOUNTER — Ambulatory Visit
Admission: RE | Admit: 2020-06-14 | Discharge: 2020-06-14 | Disposition: A | Discharge status: Home / Self Care | Payer: Managed Care, Other (non HMO) | Source: Ambulatory Visit | Attending: General Surgery | Admitting: General Surgery

## 2020-06-14 ENCOUNTER — Other Ambulatory Visit: Payer: Self-pay

## 2020-06-14 ENCOUNTER — Other Ambulatory Visit: Payer: Self-pay | Admitting: Medical Oncology

## 2020-06-14 ENCOUNTER — Telehealth: Payer: Self-pay

## 2020-06-14 ENCOUNTER — Telehealth: Payer: Self-pay | Admitting: Medical Oncology

## 2020-06-14 DIAGNOSIS — Z5111 Encounter for antineoplastic chemotherapy: Secondary | ICD-10-CM

## 2020-06-14 DIAGNOSIS — Z170 Estrogen receptor positive status [ER+]: Secondary | ICD-10-CM

## 2020-06-14 DIAGNOSIS — C50411 Malignant neoplasm of upper-outer quadrant of right female breast: Secondary | ICD-10-CM

## 2020-06-14 MED ORDER — NEULASTA 6 MG/0.6ML ~~LOC~~ SOSY: 6.0000 mg | PREFILLED_SYRINGE | Freq: Once | SUBCUTANEOUS | 4 refills | Status: DC

## 2020-06-14 MED ORDER — GADOBUTROL 1 MMOL/ML IV SOLN
7.0000 mL | Freq: Once | INTRAVENOUS | Status: AC | PRN
  Administered 2020-06-14: 7 mL via INTRAVENOUS

## 2020-06-14 MED ORDER — NEULASTA 6 MG/0.6ML ~~LOC~~ SOSY: 6.0000 mg | PREFILLED_SYRINGE | Freq: Once | SUBCUTANEOUS | 4 refills | Status: AC

## 2020-06-14 NOTE — Telephone Encounter (Signed)
This is not a patient of mine.  Please forward to [name].

## 2020-06-14 NOTE — Telephone Encounter (Addendum)
Neulasta administration She is authorized to get 1st Neulasta at the cancer center.The next 5 are authorized for home administration. 5 doses are authorized for home delivery. Per [name] at [facility] please send rx to [facility] with 4 refills..  Rx send to Dr Chryl Heck for authorization.

## 2020-06-14 NOTE — Telephone Encounter (Signed)
Malachy Mood with [facility] called with their Pharmacist needing clarification on pts Neulasta rx. They need to know what cycle and day supply is needed?  Call back number is [phone] Reference#: [id number] SD

## 2020-06-15 ENCOUNTER — Telehealth: Payer: Self-pay | Admitting: *Deleted

## 2020-06-15 NOTE — Telephone Encounter (Signed)
Hello All,  Neulasta would be given 2 days after her treatment. Her treatment will be every 21 days for a total of 6 treatments.  I work with Lexine Baton to request the prior authorization for scans. I'm not sure but I think the desk nurse typically works on getting the prior auth for home injections if needed.

## 2020-06-15 NOTE — Telephone Encounter (Signed)
Spoke to pt concerning types of chemo and immunotherapy recommended per [name] as well as timing. Received verbal understanding.  Pt requested all appts be moved to Friday as needed for her work. Pt unable to come any other day. Scheduling msg sent to move chemo to 3/18

## 2020-06-16 ENCOUNTER — Ambulatory Visit (HOSPITAL_COMMUNITY)
Admission: RE | Admit: 2020-06-16 | Discharge: 2020-06-16 | Disposition: A | Discharge status: Home / Self Care | Payer: Managed Care, Other (non HMO) | Source: Ambulatory Visit | Attending: Hematology and Oncology | Admitting: Hematology and Oncology

## 2020-06-16 ENCOUNTER — Inpatient Hospital Stay: Payer: Managed Care, Other (non HMO)

## 2020-06-16 ENCOUNTER — Other Ambulatory Visit: Payer: Self-pay | Admitting: Hematology and Oncology

## 2020-06-16 ENCOUNTER — Other Ambulatory Visit: Payer: Self-pay

## 2020-06-16 ENCOUNTER — Other Ambulatory Visit: Payer: Self-pay | Admitting: General Surgery

## 2020-06-16 ENCOUNTER — Other Ambulatory Visit: Payer: Self-pay | Admitting: *Deleted

## 2020-06-16 ENCOUNTER — Telehealth: Payer: Self-pay | Admitting: Hematology and Oncology

## 2020-06-16 ENCOUNTER — Telehealth: Payer: Self-pay

## 2020-06-16 ENCOUNTER — Encounter: Payer: Self-pay | Admitting: Hematology and Oncology

## 2020-06-16 DIAGNOSIS — Z170 Estrogen receptor positive status [ER+]: Secondary | ICD-10-CM

## 2020-06-16 DIAGNOSIS — Z01818 Encounter for other preprocedural examination: Secondary | ICD-10-CM | POA: Insufficient documentation

## 2020-06-16 DIAGNOSIS — C50411 Malignant neoplasm of upper-outer quadrant of right female breast: Secondary | ICD-10-CM | POA: Diagnosis not present

## 2020-06-16 LAB — ECHOCARDIOGRAM COMPLETE
Area-P 1/2: 4.89 cm2
S' Lateral: 2.6 cm

## 2020-06-16 MED ORDER — LORAZEPAM 0.5 MG PO TABS: 0.5000 mg | ORAL_TABLET | Freq: Four times a day (QID) | ORAL | 0 refills | Status: DC | PRN

## 2020-06-16 MED ORDER — DEXAMETHASONE 4 MG PO TABS: 8.0000 mg | ORAL_TABLET | Freq: Two times a day (BID) | ORAL | 1 refills | Status: DC

## 2020-06-16 MED ORDER — LIDOCAINE-PRILOCAINE 2.5-2.5 % EX CREA: 1.0000 "application " | TOPICAL_CREAM | CUTANEOUS | 2 refills | Status: DC | PRN

## 2020-06-16 MED ORDER — PROCHLORPERAZINE MALEATE 10 MG PO TABS: 10.0000 mg | ORAL_TABLET | Freq: Four times a day (QID) | ORAL | 1 refills | Status: DC | PRN

## 2020-06-16 MED ORDER — ONDANSETRON HCL 8 MG PO TABS: 8.0000 mg | ORAL_TABLET | Freq: Two times a day (BID) | ORAL | 1 refills | Status: DC | PRN

## 2020-06-16 NOTE — Telephone Encounter (Signed)
RN spoke to patient to confirm new appointment time for chemotherapy.     Pt will be having labs drawn 3 days prior to MD follow up at [facility], pt aware to bring results.    RN reviewed new appointment times and dates - scheduling message send to have injection rescheduled.  No further needs. Pt verbalized understanding.

## 2020-06-16 NOTE — Progress Notes (Signed)
Met with patient at registration to introduce myself as Arboriculturist and to offer available resources.  Discussed one-time $1000 Radio broadcast assistant to assist with personal expenses while going through treatment.  Patient interested in applying and provided income.  Moved patient to unoccupied desk to complete process. Patient approved for grant. Discussed in detail expenses and how they are covered. She has a copy of the approval letter and expense sheet along with the Outpatient pharmacy information.  Also advised of available copay assistance for treatment drugs. She gave me consent to apply on her behalf.  She has my card for any additional financial questions or concerns.  Patient mentioned she had an echo at 42 and thought she was to check in here being she was sent to a desk. Escorted patient to [facility] to check in for echo and advised her to come back here for class. She has already been screened and can be arrived and sent to waiting area for class.

## 2020-06-16 NOTE — Telephone Encounter (Signed)
Left message with rescheduled upcoming appointments per 3/9 schedule message. Gave option to call back to reschedule if needed.

## 2020-06-16 NOTE — Progress Notes (Signed)
  Echocardiogram 2D Echocardiogram has been performed.  Tiffany G Dance 06/16/2020, 10:13 AM

## 2020-06-16 NOTE — Progress Notes (Signed)
The following biosimilar Ziextenzo (pegfilgrastim-bmez) has been selected for use in this patient.  Kennith Center, Pharm.D., CPP 06/16/2020@10 :50 AM

## 2020-06-17 ENCOUNTER — Telehealth: Payer: Self-pay | Admitting: Hematology and Oncology

## 2020-06-17 ENCOUNTER — Encounter: Payer: Self-pay | Admitting: Hematology and Oncology

## 2020-06-17 ENCOUNTER — Other Ambulatory Visit: Payer: Self-pay | Admitting: Hematology and Oncology

## 2020-06-17 ENCOUNTER — Encounter (HOSPITAL_BASED_OUTPATIENT_CLINIC_OR_DEPARTMENT_OTHER): Payer: Self-pay | Admitting: General Surgery

## 2020-06-17 ENCOUNTER — Telehealth: Payer: Self-pay

## 2020-06-17 ENCOUNTER — Encounter: Payer: Self-pay | Admitting: *Deleted

## 2020-06-17 ENCOUNTER — Other Ambulatory Visit: Payer: Self-pay

## 2020-06-17 DIAGNOSIS — Z170 Estrogen receptor positive status [ER+]: Secondary | ICD-10-CM

## 2020-06-17 DIAGNOSIS — C50411 Malignant neoplasm of upper-outer quadrant of right female breast: Secondary | ICD-10-CM

## 2020-06-17 MED ORDER — LIDOCAINE-PRILOCAINE 2.5-2.5 % EX CREA: TOPICAL_CREAM | CUTANEOUS | 3 refills | Status: DC

## 2020-06-17 NOTE — Progress Notes (Signed)
Enrolled patient in Max program for [place].  Patient approved for $25,000 06/10/20 - 04/09/21 leaving her with a balance with as little as a $5 copay.  She will receive a copy of the approval letter for her records only.  A copy of the approval letter and copay card information will be given to [facility] for billing/claim submissions.

## 2020-06-17 NOTE — Telephone Encounter (Signed)
Returned call to reschedule appointments per 3/10 schedule message. Patient opted to keep appointments as is. No changes made.

## 2020-06-17 NOTE — Telephone Encounter (Signed)
After much communication I was finally able to call [name] and confirm with [name] who is the patient's case manager that everything has been authorized for her injections. [name] was able to confirm with me that the Patient is all set up for her home deliveries of Neulasta. Her insurance will be covering/providing nursing aid to help administer the medication to the patient.   [employer] [employer] [facility] case Freight forwarder with [name] [phone] ex [phone]

## 2020-06-17 NOTE — Progress Notes (Signed)
I called the patient, explained to her that immunotherapy is not recommended for this particular subtype at this time. Radiation will be considered but not before surgery. She expressed understanding.  [name]

## 2020-06-17 NOTE — Progress Notes (Signed)
Enrolled patient in [place] program for [place].  Patient approved for $20,000 leaving her with a $0 out-of-pocket for first dose or cycle and as little as $5 for subsequent doses or cycles after insurance pays.  She will receive a copy of the approval letter which will be for her records only.  A copy of the approval and copay card information will be given to [facility] for billing/copay claim submissions.

## 2020-06-18 ENCOUNTER — Telehealth: Payer: Self-pay | Admitting: Medical Oncology

## 2020-06-18 ENCOUNTER — Encounter: Payer: Self-pay | Admitting: Hematology and Oncology

## 2020-06-18 ENCOUNTER — Other Ambulatory Visit: Payer: Self-pay | Admitting: Medical Oncology

## 2020-06-18 NOTE — Progress Notes (Signed)
Pharmacist Chemotherapy Monitoring - Initial Assessment    Anticipated start date: 06/25/20  Regimen:  . Are orders appropriate based on the patient's diagnosis, regimen, and cycle? Yes . Does the plan date match the patient's scheduled date? Yes . Is the sequencing of drugs appropriate? Yes . Are the premedications appropriate for the patient's regimen? Yes . Prior Authorization for treatment is: Approved o If applicable, is the correct biosimilar selected based on the patient's insurance? yes  Organ Function and Labs: Marland Kitchen Are dose adjustments needed based on the patient's renal function, hepatic function, or hematologic function? No . Are appropriate labs ordered prior to the start of patient's treatment? Yes . Other organ system assessment, if indicated: trastuzumab: Echo/ MUGA and pertuzumab: Echo/ MUGA . The following baseline labs, if indicated, have been ordered: N/A  Dose Assessment: . Are the drug doses appropriate? Yes . Are the following correct: o Drug concentrations Yes o IV fluid compatible with drug Yes o Administration routes Yes o Timing of therapy Yes . If applicable, does the patient have documented access for treatment and/or plans for port-a-cath placement? yes . If applicable, have lifetime cumulative doses been properly documented and assessed? yes Lifetime Dose Tracking  No doses have been documented on this patient for the following tracked chemicals: Doxorubicin, Epirubicin, Idarubicin, Daunorubicin, Mitoxantrone, Bleomycin, Oxaliplatin, Carboplatin, Liposomal Doxorubicin  o   Toxicity Monitoring/Prevention: . The patient has the following take home antiemetics prescribed: Ondansetron, Prochlorperazine and Lorazepam . The patient has the following take home medications prescribed: N/A . Medication allergies and previous infusion related reactions, if applicable, have been reviewed and addressed. Yes . The patient's current medication list has been assessed  for drug-drug interactions with their chemotherapy regimen. no significant drug-drug interactions were identified on review.  Order Review: . Are the treatment plan orders signed? No . Is the patient scheduled to see a provider prior to their treatment? Yes  I verify that I have reviewed each item in the above checklist and answered each question accordingly.  Romualdo Bolk Romeo, Moro, 06/18/2020  9:54 AM

## 2020-06-18 NOTE — Telephone Encounter (Signed)
Neulasta for home injection after 03/18 chemo  -Neulasta order clarified with Mina Marble at [facility] .She will fax over Neulasta order for signature for  home injection on 03/21. Per Jeani Hawking, [facility] will send nurse to pts home for  Injection instructions.

## 2020-06-19 ENCOUNTER — Other Ambulatory Visit (HOSPITAL_COMMUNITY)
Admission: RE | Admit: 2020-06-19 | Discharge: 2020-06-19 | Disposition: A | Discharge status: Home / Self Care | Payer: Managed Care, Other (non HMO) | Source: Ambulatory Visit | Attending: General Surgery | Admitting: General Surgery

## 2020-06-19 DIAGNOSIS — Z20822 Contact with and (suspected) exposure to covid-19: Secondary | ICD-10-CM | POA: Insufficient documentation

## 2020-06-19 DIAGNOSIS — Z01812 Encounter for preprocedural laboratory examination: Secondary | ICD-10-CM | POA: Diagnosis present

## 2020-06-19 LAB — SARS CORONAVIRUS 2 (TAT 6-24 HRS): SARS Coronavirus 2: NEGATIVE

## 2020-06-21 ENCOUNTER — Encounter (HOSPITAL_COMMUNITY): Payer: Managed Care, Other (non HMO)

## 2020-06-21 ENCOUNTER — Ambulatory Visit (HOSPITAL_COMMUNITY): Payer: Managed Care, Other (non HMO)

## 2020-06-21 ENCOUNTER — Other Ambulatory Visit (HOSPITAL_COMMUNITY): Payer: Managed Care, Other (non HMO)

## 2020-06-22 ENCOUNTER — Other Ambulatory Visit: Payer: Self-pay | Admitting: Hematology and Oncology

## 2020-06-22 MED ORDER — ZIEXTENZO 6 MG/0.6ML ~~LOC~~ SOSY: 6.0000 mg | PREFILLED_SYRINGE | SUBCUTANEOUS | 5 refills | Status: DC

## 2020-06-22 MED ORDER — ENSURE PRE-SURGERY PO LIQD: 296.0000 mL | Freq: Once | ORAL | Status: DC

## 2020-06-22 NOTE — Progress Notes (Signed)
I ordered [id number] per insurance recommendations.  [name]

## 2020-06-22 NOTE — Progress Notes (Signed)

## 2020-06-23 ENCOUNTER — Other Ambulatory Visit: Payer: Self-pay

## 2020-06-23 ENCOUNTER — Encounter (HOSPITAL_BASED_OUTPATIENT_CLINIC_OR_DEPARTMENT_OTHER): Payer: Self-pay | Admitting: General Surgery

## 2020-06-23 ENCOUNTER — Ambulatory Visit (HOSPITAL_BASED_OUTPATIENT_CLINIC_OR_DEPARTMENT_OTHER): Payer: Managed Care, Other (non HMO) | Admitting: Anesthesiology

## 2020-06-23 ENCOUNTER — Encounter (HOSPITAL_BASED_OUTPATIENT_CLINIC_OR_DEPARTMENT_OTHER)
Admission: RE | Disposition: A | Discharge status: Home / Self Care | Payer: Self-pay | Source: Ambulatory Visit | Attending: General Surgery

## 2020-06-23 ENCOUNTER — Ambulatory Visit (HOSPITAL_BASED_OUTPATIENT_CLINIC_OR_DEPARTMENT_OTHER)
Admission: RE | Admit: 2020-06-23 | Discharge: 2020-06-23 | Disposition: A | Discharge status: Home / Self Care | Payer: Managed Care, Other (non HMO) | Source: Ambulatory Visit | Attending: General Surgery | Admitting: General Surgery

## 2020-06-23 ENCOUNTER — Ambulatory Visit (HOSPITAL_COMMUNITY): Payer: Managed Care, Other (non HMO)

## 2020-06-23 DIAGNOSIS — Z419 Encounter for procedure for purposes other than remedying health state, unspecified: Secondary | ICD-10-CM

## 2020-06-23 DIAGNOSIS — C50911 Malignant neoplasm of unspecified site of right female breast: Secondary | ICD-10-CM | POA: Diagnosis present

## 2020-06-23 DIAGNOSIS — Z87891 Personal history of nicotine dependence: Secondary | ICD-10-CM | POA: Diagnosis not present

## 2020-06-23 HISTORY — PX: PORTACATH PLACEMENT: SHX2246

## 2020-06-23 LAB — POCT PREGNANCY, URINE: Preg Test, Ur: NEGATIVE

## 2020-06-23 SURGERY — INSERTION, TUNNELED CENTRAL VENOUS DEVICE, WITH PORT
Anesthesia: General | Site: Breast

## 2020-06-23 MED ORDER — BUPIVACAINE HCL (PF) 0.25 % IJ SOLN
INTRAMUSCULAR | Status: DC | PRN
  Administered 2020-06-23: 10 mL

## 2020-06-23 MED ORDER — OXYMETAZOLINE HCL 0.05 % NA SOLN
NASAL | Status: AC
  Filled 2020-06-23: qty 30

## 2020-06-23 MED ORDER — ONDANSETRON HCL 4 MG/2ML IJ SOLN
INTRAMUSCULAR | Status: DC | PRN
  Administered 2020-06-23: 4 mg via INTRAVENOUS

## 2020-06-23 MED ORDER — PROPOFOL 10 MG/ML IV BOLUS
INTRAVENOUS | Status: DC | PRN
  Administered 2020-06-23: 150 mg via INTRAVENOUS

## 2020-06-23 MED ORDER — BACITRACIN ZINC 500 UNIT/GM EX OINT
TOPICAL_OINTMENT | CUTANEOUS | Status: AC
  Filled 2020-06-23: qty 0.9

## 2020-06-23 MED ORDER — KETOROLAC TROMETHAMINE 15 MG/ML IJ SOLN
15.0000 mg | INTRAMUSCULAR | Status: AC
  Administered 2020-06-23: 15 mg via INTRAVENOUS

## 2020-06-23 MED ORDER — DEXAMETHASONE SODIUM PHOSPHATE 4 MG/ML IJ SOLN
INTRAMUSCULAR | Status: DC | PRN
  Administered 2020-06-23: 4 mg via INTRAVENOUS

## 2020-06-23 MED ORDER — BUPIVACAINE HCL (PF) 0.25 % IJ SOLN
INTRAMUSCULAR | Status: AC
  Filled 2020-06-23: qty 30

## 2020-06-23 MED ORDER — LACTATED RINGERS IV SOLN: INTRAVENOUS | Status: DC

## 2020-06-23 MED ORDER — HEPARIN (PORCINE) IN NACL 2-0.9 UNITS/ML
INTRAMUSCULAR | Status: AC | PRN
  Administered 2020-06-23: 500 mL via INTRAVENOUS

## 2020-06-23 MED ORDER — EPHEDRINE SULFATE 50 MG/ML IJ SOLN
INTRAMUSCULAR | Status: DC | PRN
  Administered 2020-06-23: 10 mg via INTRAVENOUS

## 2020-06-23 MED ORDER — HEPARIN (PORCINE) IN NACL 1000-0.9 UT/500ML-% IV SOLN
INTRAVENOUS | Status: AC
  Filled 2020-06-23: qty 500

## 2020-06-23 MED ORDER — PHENYLEPHRINE HCL (PRESSORS) 10 MG/ML IV SOLN
INTRAVENOUS | Status: DC | PRN
  Administered 2020-06-23 (×3): 80 ug via INTRAVENOUS

## 2020-06-23 MED ORDER — CEFAZOLIN SODIUM-DEXTROSE 2-4 GM/100ML-% IV SOLN
2.0000 g | INTRAVENOUS | Status: AC
  Administered 2020-06-23: 2 g via INTRAVENOUS

## 2020-06-23 MED ORDER — MIDAZOLAM HCL 5 MG/5ML IJ SOLN
INTRAMUSCULAR | Status: DC | PRN
  Administered 2020-06-23: 2 mg via INTRAVENOUS

## 2020-06-23 MED ORDER — LIDOCAINE-EPINEPHRINE 1 %-1:100000 IJ SOLN
INTRAMUSCULAR | Status: AC
  Filled 2020-06-23: qty 1

## 2020-06-23 MED ORDER — CEFAZOLIN SODIUM-DEXTROSE 2-4 GM/100ML-% IV SOLN
INTRAVENOUS | Status: AC
  Filled 2020-06-23: qty 100

## 2020-06-23 MED ORDER — FENTANYL CITRATE (PF) 100 MCG/2ML IJ SOLN
INTRAMUSCULAR | Status: DC | PRN
  Administered 2020-06-23: 50 ug via INTRAVENOUS

## 2020-06-23 MED ORDER — HEPARIN SOD (PORK) LOCK FLUSH 100 UNIT/ML IV SOLN
INTRAVENOUS | Status: AC
  Filled 2020-06-23: qty 5

## 2020-06-23 MED ORDER — LIDOCAINE 2% (20 MG/ML) 5 ML SYRINGE
INTRAMUSCULAR | Status: DC | PRN
  Administered 2020-06-23: 60 mg via INTRAVENOUS

## 2020-06-23 MED ORDER — ACETAMINOPHEN 500 MG PO TABS
ORAL_TABLET | ORAL | Status: AC
  Filled 2020-06-23: qty 2

## 2020-06-23 MED ORDER — FENTANYL CITRATE (PF) 100 MCG/2ML IJ SOLN
INTRAMUSCULAR | Status: AC
  Filled 2020-06-23: qty 2

## 2020-06-23 MED ORDER — OXYCODONE HCL 5 MG PO TABS
ORAL_TABLET | ORAL | Status: AC
  Filled 2020-06-23: qty 1

## 2020-06-23 MED ORDER — KETOROLAC TROMETHAMINE 15 MG/ML IJ SOLN
INTRAMUSCULAR | Status: AC
  Filled 2020-06-23: qty 1

## 2020-06-23 MED ORDER — ACETAMINOPHEN 500 MG PO TABS
1000.0000 mg | ORAL_TABLET | Freq: Once | ORAL | Status: AC
  Administered 2020-06-23: 1000 mg via ORAL

## 2020-06-23 MED ORDER — ONDANSETRON HCL 4 MG/2ML IJ SOLN
INTRAMUSCULAR | Status: AC
  Filled 2020-06-23: qty 2

## 2020-06-23 MED ORDER — HEPARIN SOD (PORK) LOCK FLUSH 100 UNIT/ML IV SOLN
INTRAVENOUS | Status: DC | PRN
  Administered 2020-06-23: 500 [IU] via INTRAVENOUS

## 2020-06-23 MED ORDER — OXYCODONE HCL 5 MG PO TABS
5.0000 mg | ORAL_TABLET | Freq: Once | ORAL | Status: AC
Start: 2020-06-23 — End: 2020-06-23
  Administered 2020-06-23: 5 mg via ORAL

## 2020-06-23 MED ORDER — FENTANYL CITRATE (PF) 100 MCG/2ML IJ SOLN
25.0000 ug | INTRAMUSCULAR | Status: DC | PRN
Start: 2020-06-23 — End: 2020-06-23

## 2020-06-23 MED ORDER — MIDAZOLAM HCL 2 MG/2ML IJ SOLN
INTRAMUSCULAR | Status: AC
  Filled 2020-06-23: qty 2

## 2020-06-23 MED ORDER — DEXAMETHASONE SODIUM PHOSPHATE 10 MG/ML IJ SOLN
INTRAMUSCULAR | Status: AC
  Filled 2020-06-23: qty 1

## 2020-06-23 MED ORDER — LIDOCAINE 2% (20 MG/ML) 5 ML SYRINGE
INTRAMUSCULAR | Status: AC
  Filled 2020-06-23: qty 5

## 2020-06-23 SURGICAL SUPPLY — 51 items
ADH SKN CLS APL DERMABOND .7 (GAUZE/BANDAGES/DRESSINGS) ×1
APL PRP STRL LF DISP 70% ISPRP (MISCELLANEOUS) ×1
APL SKNCLS STERI-STRIP NONHPOA (GAUZE/BANDAGES/DRESSINGS) ×1
BAG DECANTER FOR FLEXI CONT (MISCELLANEOUS) ×2 IMPLANT
BENZOIN TINCTURE PRP APPL 2/3 (GAUZE/BANDAGES/DRESSINGS) ×2 IMPLANT
BLADE SURG 11 STRL SS (BLADE) ×2 IMPLANT
BLADE SURG 15 STRL LF DISP TIS (BLADE) ×1 IMPLANT
BLADE SURG 15 STRL SS (BLADE) ×2
CANISTER SUCT 1200ML W/VALVE (MISCELLANEOUS) IMPLANT
CHLORAPREP W/TINT 26 (MISCELLANEOUS) ×2 IMPLANT
COVER BACK TABLE 60X90IN (DRAPES) ×2 IMPLANT
COVER MAYO STAND STRL (DRAPES) ×2 IMPLANT
COVER PROBE 5X48 (MISCELLANEOUS)
COVER WAND RF STERILE (DRAPES) IMPLANT
DECANTER SPIKE VIAL GLASS SM (MISCELLANEOUS) IMPLANT
DERMABOND ADVANCED (GAUZE/BANDAGES/DRESSINGS) ×1
DERMABOND ADVANCED .7 DNX12 (GAUZE/BANDAGES/DRESSINGS) ×1 IMPLANT
DRAPE C-ARM 42X72 X-RAY (DRAPES) ×2 IMPLANT
DRAPE LAPAROSCOPIC ABDOMINAL (DRAPES) ×2 IMPLANT
DRAPE UTILITY XL STRL (DRAPES) ×2 IMPLANT
DRSG TEGADERM 4X4.75 (GAUZE/BANDAGES/DRESSINGS) IMPLANT
ELECT COATED BLADE 2.86 ST (ELECTRODE) ×2 IMPLANT
ELECT REM PT RETURN 9FT ADLT (ELECTROSURGICAL) ×2
ELECTRODE REM PT RTRN 9FT ADLT (ELECTROSURGICAL) ×1 IMPLANT
GAUZE SPONGE 4X4 12PLY STRL LF (GAUZE/BANDAGES/DRESSINGS) ×2 IMPLANT
GLOVE SURG ENC MOIS LTX SZ7 (GLOVE) ×2 IMPLANT
GLOVE SURG UNDER POLY LF SZ7.5 (GLOVE) ×2 IMPLANT
GOWN STRL REUS W/ TWL LRG LVL3 (GOWN DISPOSABLE) ×2 IMPLANT
GOWN STRL REUS W/TWL LRG LVL3 (GOWN DISPOSABLE) ×4
IV KIT MINILOC 20X1 SAFETY (NEEDLE) IMPLANT
KIT CVR 48X5XPRB PLUP LF (MISCELLANEOUS) IMPLANT
KIT PORT POWER 8FR ISP CVUE (Port) ×2 IMPLANT
NDL SAFETY ECLIPSE 18X1.5 (NEEDLE) IMPLANT
NEEDLE HYPO 18GX1.5 SHARP (NEEDLE)
NEEDLE HYPO 25X1 1.5 SAFETY (NEEDLE) ×2 IMPLANT
PACK BASIN DAY SURGERY FS (CUSTOM PROCEDURE TRAY) ×2 IMPLANT
PENCIL SMOKE EVACUATOR (MISCELLANEOUS) ×2 IMPLANT
SHEATH COOK PEEL AWAY SET 9F (SHEATH) ×2 IMPLANT
SLEEVE SCD COMPRESS KNEE MED (STOCKING) ×2 IMPLANT
STRIP CLOSURE SKIN 1/2X4 (GAUZE/BANDAGES/DRESSINGS) ×2 IMPLANT
SUT MNCRL AB 4-0 PS2 18 (SUTURE) ×2 IMPLANT
SUT PROLENE 2 0 SH DA (SUTURE) ×2 IMPLANT
SUT SILK 2 0 TIES 17X18 (SUTURE)
SUT SILK 2-0 18XBRD TIE BLK (SUTURE) IMPLANT
SUT VIC AB 3-0 SH 27 (SUTURE) ×2
SUT VIC AB 3-0 SH 27X BRD (SUTURE) ×1 IMPLANT
SYR 5ML LUER SLIP (SYRINGE) ×2 IMPLANT
SYR CONTROL 10ML LL (SYRINGE) ×2 IMPLANT
TOWEL GREEN STERILE FF (TOWEL DISPOSABLE) ×2 IMPLANT
TUBE CONNECTING 20X1/4 (TUBING) IMPLANT
YANKAUER SUCT BULB TIP NO VENT (SUCTIONS) IMPLANT

## 2020-06-23 NOTE — Transfer of Care (Signed)
Immediate Anesthesia Transfer of Care Note  Patient: [name]  Procedure(s) Performed: INSERTION PORT-A-CATH (N/A Breast)  Patient Location: PACU  Anesthesia Type:General  Level of Consciousness: sedated  Airway & Oxygen Therapy: Patient Spontanous Breathing and Patient connected to face mask oxygen  Post-op Assessment: Report given to RN and Post -op Vital signs reviewed and stable  Post vital signs: Reviewed and stable  Last Vitals:  Vitals Value Taken Time  BP 107/69 06/23/20 1130  Temp    Pulse 86 06/23/20 1133  Resp 13 06/23/20 1133  SpO2 100 % 06/23/20 1133  Vitals shown include unvalidated device data.  Last Pain:  Vitals:   06/23/20 0930  PainSc: 3          Complications: No complications documented.

## 2020-06-23 NOTE — Anesthesia Preprocedure Evaluation (Addendum)
Anesthesia Evaluation  Patient identified by MRN, date of birth, ID band Patient awake    Reviewed: Allergy & Precautions, H&P , NPO status , Patient's Chart, lab work & pertinent test results  Airway Mallampati: II  TM Distance: >3 FB Neck ROM: Full    Dental no notable dental hx. (+) Teeth Intact, Dental Advisory Given   Pulmonary neg pulmonary ROS, former smoker,    Pulmonary exam normal breath sounds clear to auscultation       Cardiovascular negative cardio ROS   Rhythm:Regular Rate:Normal     Neuro/Psych negative neurological ROS  negative psych ROS   GI/Hepatic negative GI ROS, Neg liver ROS,   Endo/Other  negative endocrine ROS  Renal/GU negative Renal ROS  negative genitourinary   Musculoskeletal   Abdominal   Peds  Hematology  (+) Blood dyscrasia, anemia ,   Anesthesia Other Findings   Reproductive/Obstetrics negative OB ROS                            Anesthesia Physical Anesthesia Plan  ASA: II  Anesthesia Plan: General   Post-op Pain Management:    Induction: Intravenous  PONV Risk Score and Plan: 4 or greater and Ondansetron, Dexamethasone and Midazolam  Airway Management Planned: LMA  Additional Equipment:   Intra-op Plan:   Post-operative Plan: Extubation in OR  Informed Consent: I have reviewed the patients History and Physical, chart, labs and discussed the procedure including the risks, benefits and alternatives for the proposed anesthesia with the patient or authorized representative who has indicated his/her understanding and acceptance.     Dental advisory given  Plan Discussed with: CRNA  Anesthesia Plan Comments:         Anesthesia Quick Evaluation

## 2020-06-23 NOTE — H&P (Signed)
  77 yof who has fh in maternal aunt who had a palpable mass noted on her own exam. she has d density mammogram. she had masses at 9 oclock measuring 2.1 cm and 10 oclock 1.9 cm and numerous other masses from 8 to 10. there is one abnormal axillary node. the clips are 4.7 cm apart. MRI shows a 7.9 cm contiguous mass that goes to nipple, skin thickening, enlarged right axillary node, there is also a left loq mass that needs mr biopsy. 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 and Ki is 30%. she is here to discuss options today   Past Surgical History [name], MD; 06/09/2020 1:32 PM) Oral Surgery   Allergies [name], MD; 06/09/2020 1:32 PM) No Known Drug Allergies  [05/12/2019]:  Medication History [name], MD; 06/09/2020 1:32 PM) Ondansetron (4MG Tablet Disint, 1 (one) Oral every six hours, as needed, Taken starting 06/26/2019) Active. FeroSul (325 (65 Fe)MG Tablet, Oral) Active. Mesalamine (1.2GM Tablet DR, Oral) Active. Multi Vitamin (Oral) Active.  Social History [name], MD; 06/09/2020 1:32 PM) Alcohol use  Recently quit alcohol use. Caffeine use  Carbonated beverages, Coffee, Tea. No drug use  Tobacco use  Former smoker.  Family History [name], MD; 06/09/2020 1:32 PM) Diabetes Mellitus  Father.   Physical Exam [name] MD; 06/09/2020 1:29 PM) General Mental Status-Alert. Orientation-Oriented X3. Breast Nipples-No Discharge. Note: ruoq 8 cm mass mobile nontender, no skin involvement Lymphatic Head & Neck General Head & Neck Lymphatics: Bilateral - Description - Normal. Axillary General Axillary Region: Bilateral - Description - Normal. Note: no Andrew adenopathy   Assessment & Plan [name] MD; 06/09/2020 1:32 PM) BREAST CANCER METASTASIZED TO AXILLARY LYMPH NODE, RIGHT (C50.911) Story: Port placement, primary chemotherapy,  Left breast mr biopsy, genetics We discussed the staging and pathophysiology of breast cancer. We discussed all of the different options for treatment for breast cancer including surgery, chemotherapy, radiation therapy, Herceptin, and antiestrogen therapy. we discussed staging scans, left mr biopsy for the small lesion. we discussed need for chemotherapy and why doing this primarily is reasonable. she will get genetics today. we discussed likely tad later and lumpectomy/ssm/nsm/mastectomy as well. discussed port placement for chemotherapy which I will schedule

## 2020-06-23 NOTE — Op Note (Signed)
Preoperative diagnosisnode positive right breast cancer Postoperative diagnosis: Same as above Procedure: Leftinternal jugular port placement with ultrasound guidance Surgeon: Dr. Serita Grammes Anesthesia: General  Estimated blood loss:minimal Specimens:none Sponge and needlecount was correct atcompletion Drains: None Disposition recovery stable condition  Indications:44 yof who has fh in maternal aunt who had a palpable mass noted on her own exam. she has d density mammogram. she had masses at 9 oclock measuring 2.1 cm and 10 oclock 1.9 cm and numerous other masses from 8 to 10. there is one abnormal axillary node. the clips are 4.7 cm apart. MRI shows a 7.9 cm contiguous mass that goes to nipple, skin thickening, enlarged right axillary node, there is also a left loq mass that needs mr biopsy. 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 and Ki is 30%. we discussed primary systemic therapy  Procedure: After informed consent was obtainedshe was taken to the OR.She was given antibiotics. SCDs were placed. She was placed under general anesthesia without complication. She was prepped and draped in the standard sterile surgical fashion. A surgical timeout was then performed.  I used the ultrasound to identify theleftinternal jugular vein. Under ultrasound guidance I then accessed the vein with the needle. I passed the wire. The wire was in the vein both by ultrasound and by fluoroscopy. I thenmade an incisionon herleft chest.I tunneled the line between the 2 sites. I then placed the dilator over the wire. I observed this with fluoroscopy to go in the correct position. I then removedthe wire. I then passed the line. The peel-away sheath was removed. I pulled the line back to be in the superior vena cava. The tip of the line is in the superior vena cava. I then attached the port. I sutured this into  place with 2-0 Prolene. I then closed this with 3-0 Vicryl and 4-0 Monocryl. Glue was placed. Final fluoroscopic image showed the port to be in good position. I then accessed the port and was able to aspirate blood and packed this with heparin.She tolerated well, was transferred to recovery stable.

## 2020-06-23 NOTE — Discharge Instructions (Signed)
PORT-A-CATH: POST OP INSTRUCTIONS  Always review your discharge instruction sheet given to you by the facility where your surgery was performed.   1. A prescription for pain medication may be given to you upon discharge. Take your pain medication as prescribed, if needed. If narcotic pain medicine is not needed, then you make take acetaminophen (Tylenol) or ibuprofen (Advil) as needed.  *No tylenol until 3:30pm *No Ibuprofen/Motrin/Advil until 4:45pm 2. Take your usually prescribed medications unless otherwise directed. 3. If you need a refill on your pain medication, please contact our office. All narcotic pain medicine now requires a paper prescription.  Phoned in and fax refills are no longer allowed by law.  Prescriptions will not be filled after 5 pm or on weekends.  4. You should follow a light diet for the remainder of the day after your procedure. 5. Most patients will experience some mild swelling and/or bruising in the area of the incision. It may take several days to resolve. 6. It is common to experience some constipation if taking pain medication after surgery. Increasing fluid intake and taking a stool softener (such as Colace) will usually help or prevent this problem from occurring. A mild laxative (Milk of Magnesia or Miralax) should be taken according to package directions if there are no bowel movements after 48 hours.  7. Unless discharge instructions indicate otherwise, you may remove your bandages 48 hours after surgery, and you may shower at that time. You may have steri-strips (small white skin tapes) in place directly over the incision.  These strips should be left on the skin for 7-10 days.  If your surgeon used Dermabond (skin glue) on the incision, you may shower in 24 hours.  The glue will flake off over the next 2-3 weeks.  8. If your port is left accessed at the end of surgery (needle left in port), the dressing cannot get wet and should only by changed by a healthcare  professional. When the port is no longer accessed (when the needle has been removed), follow step 7.   9. ACTIVITIES:  Limit activity involving your arms for the next 72 hours. Do no strenuous exercise or activity for 1 week. You may drive when you are no longer taking prescription pain medication, you can comfortably wear a seatbelt, and you can maneuver your car. 10.You may need to see your doctor in the office for a follow-up appointment.  Please       check with your doctor.  11.When you receive a new Port-a-Cath, you will get a product guide and        ID card.  Please keep them in case you need them.  WHEN TO CALL YOUR DOCTOR [phone]): 1. Fever over 101.0 2. Chills 3. Continued bleeding from incision 4. Increased redness and tenderness at the site 5. Shortness of breath, difficulty breathing   The clinic staff is available to answer your questions during regular business hours. Please don't hesitate to call and ask to speak to one of the nurses or medical assistants for clinical concerns. If you have a medical emergency, go to the nearest emergency room or call 911.  A surgeon from [facility] is always on call at the hospital.     For further information, please visit [email or web address]    Post Anesthesia Home Care Instructions  Activity: Get plenty of rest for the remainder of the day. A responsible individual must stay with you for 24 hours following the procedure.  For  the next 24 hours, DO NOT: -Drive a car -Paediatric nurse -Drink alcoholic beverages -Take any medication unless instructed by your physician -Make any legal decisions or sign important papers.  Meals: Start with liquid foods such as gelatin or soup. Progress to regular foods as tolerated. Avoid greasy, spicy, heavy foods. If nausea and/or vomiting occur, drink only clear liquids until the nausea and/or vomiting subsides. Call your physician if vomiting continues.  Special  Instructions/Symptoms: Your throat may feel dry or sore from the anesthesia or the breathing tube placed in your throat during surgery. If this causes discomfort, gargle with warm salt water. The discomfort should disappear within 24 hours.  If you had a scopolamine patch placed behind your ear for the management of post- operative nausea and/or vomiting:  1. The medication in the patch is effective for 72 hours, after which it should be removed.  Wrap patch in a tissue and discard in the trash. Wash hands thoroughly with soap and water. 2. You may remove the patch earlier than 72 hours if you experience unpleasant side effects which may include dry mouth, dizziness or visual disturbances. 3. Avoid touching the patch. Wash your hands with soap and water after contact with the patch.

## 2020-06-23 NOTE — Interval H&P Note (Signed)
History and Physical Interval Note:  06/23/2020 10:23 AM  [name]  has presented today for surgery, with the diagnosis of BREAST CANCER.  The various methods of treatment have been discussed with the patient and family. After consideration of risks, benefits and other options for treatment, the patient has consented to  Procedure(s) with comments: INSERTION PORT-A-CATH (N/A) - START TIME OF 11:00 AM FOR 60 MINUTES [name] as a surgical intervention.  The patient's history has been reviewed, patient examined, no change in status, stable for surgery.  I have reviewed the patient's chart and labs.  Questions were answered to the patient's satisfaction.     [name]

## 2020-06-23 NOTE — Anesthesia Postprocedure Evaluation (Signed)
Anesthesia Post Note  Patient: [name]  Procedure(s) Performed: INSERTION PORT-A-CATH (N/A Breast)     Patient location during evaluation: PACU Anesthesia Type: General Level of consciousness: awake and alert Pain management: pain level controlled Vital Signs Assessment: post-procedure vital signs reviewed and stable Respiratory status: spontaneous breathing, nonlabored ventilation and respiratory function stable Cardiovascular status: blood pressure returned to baseline and stable Postop Assessment: no apparent nausea or vomiting Anesthetic complications: no   No complications documented.  Last Vitals:  Vitals:   06/23/20 1145 06/23/20 1200  BP: 125/85 129/86  Pulse: 97 81  Resp: 14 (!) 23  Temp:    SpO2: 100% 97%    Last Pain:  Vitals:   06/23/20 1145  PainSc: 5                  Denetria Luevanos,W. EDMOND

## 2020-06-23 NOTE — Anesthesia Procedure Notes (Signed)
Procedure Name: LMA Insertion Date/Time: 06/23/2020 10:47 AM Performed by: Maryella Shivers, CRNA Pre-anesthesia Checklist: Patient identified, Emergency Drugs available, Suction available and Patient being monitored Patient Re-evaluated:Patient Re-evaluated prior to induction Oxygen Delivery Method: Circle system utilized Preoxygenation: Pre-oxygenation with 100% oxygen Induction Type: IV induction Ventilation: Mask ventilation without difficulty LMA: LMA inserted LMA Size: 4.0 Number of attempts: 1 Airway Equipment and Method: Bite block Placement Confirmation: positive ETCO2 Tube secured with: Tape Dental Injury: Teeth and Oropharynx as per pre-operative assessment

## 2020-06-24 ENCOUNTER — Ambulatory Visit: Payer: Managed Care, Other (non HMO) | Admitting: Hematology and Oncology

## 2020-06-24 ENCOUNTER — Encounter: Payer: Self-pay | Admitting: Hematology and Oncology

## 2020-06-24 ENCOUNTER — Other Ambulatory Visit: Payer: Managed Care, Other (non HMO)

## 2020-06-24 ENCOUNTER — Ambulatory Visit: Payer: Managed Care, Other (non HMO)

## 2020-06-24 ENCOUNTER — Encounter: Payer: Self-pay | Admitting: *Deleted

## 2020-06-24 ENCOUNTER — Inpatient Hospital Stay: Payer: Managed Care, Other (non HMO)

## 2020-06-24 ENCOUNTER — Inpatient Hospital Stay (HOSPITAL_BASED_OUTPATIENT_CLINIC_OR_DEPARTMENT_OTHER): Payer: Managed Care, Other (non HMO) | Admitting: Hematology and Oncology

## 2020-06-24 VITALS — BP 146/95 | HR 87 | Temp 97.9°F | Resp 16 | Ht 63.0 in | Wt 154.5 lb

## 2020-06-24 DIAGNOSIS — Z803 Family history of malignant neoplasm of breast: Secondary | ICD-10-CM | POA: Diagnosis not present

## 2020-06-24 DIAGNOSIS — Z170 Estrogen receptor positive status [ER+]: Secondary | ICD-10-CM | POA: Diagnosis not present

## 2020-06-24 DIAGNOSIS — C50411 Malignant neoplasm of upper-outer quadrant of right female breast: Secondary | ICD-10-CM

## 2020-06-24 DIAGNOSIS — Z5112 Encounter for antineoplastic immunotherapy: Secondary | ICD-10-CM | POA: Diagnosis not present

## 2020-06-24 DIAGNOSIS — D500 Iron deficiency anemia secondary to blood loss (chronic): Secondary | ICD-10-CM

## 2020-06-24 DIAGNOSIS — Z5111 Encounter for antineoplastic chemotherapy: Secondary | ICD-10-CM

## 2020-06-24 LAB — CBC WITH DIFFERENTIAL/PLATELET
Abs Immature Granulocytes: 0.04 10*3/uL (ref 0.00–0.07)
Basophils Absolute: 0 10*3/uL (ref 0.0–0.1)
Basophils Relative: 0 %
Eosinophils Absolute: 0 10*3/uL (ref 0.0–0.5)
Eosinophils Relative: 0 %
HCT: 37.6 % (ref 36.0–46.0)
Hemoglobin: 11.9 g/dL — ABNORMAL LOW (ref 12.0–15.0)
Immature Granulocytes: 0 %
Lymphocytes Relative: 10 %
Lymphs Abs: 1.4 10*3/uL (ref 0.7–4.0)
MCH: 26.5 pg (ref 26.0–34.0)
MCHC: 31.6 g/dL (ref 30.0–36.0)
MCV: 83.7 fL (ref 80.0–100.0)
Monocytes Absolute: 0.9 10*3/uL (ref 0.1–1.0)
Monocytes Relative: 6 %
Neutro Abs: 11.6 10*3/uL — ABNORMAL HIGH (ref 1.7–7.7)
Neutrophils Relative %: 84 %
Platelets: 281 10*3/uL (ref 150–400)
RBC: 4.49 MIL/uL (ref 3.87–5.11)
RDW: 14.7 % (ref 11.5–15.5)
WBC: 13.9 10*3/uL — ABNORMAL HIGH (ref 4.0–10.5)
nRBC: 0 % (ref 0.0–0.2)

## 2020-06-24 LAB — COMPREHENSIVE METABOLIC PANEL
ALT: 12 U/L (ref 0–44)
AST: 15 U/L (ref 15–41)
Albumin: 4 g/dL (ref 3.5–5.0)
Alkaline Phosphatase: 93 U/L (ref 38–126)
Anion gap: 10 (ref 5–15)
BUN: 6 mg/dL (ref 6–20)
CO2: 25 mmol/L (ref 22–32)
Calcium: 9.4 mg/dL (ref 8.9–10.3)
Chloride: 106 mmol/L (ref 98–111)
Creatinine, Ser: 0.86 mg/dL (ref 0.44–1.00)
GFR, Estimated: >60 mL/min (ref >60)
Glucose, Bld: 99 mg/dL (ref 70–99)
Potassium: 4.2 mmol/L (ref 3.5–5.1)
Sodium: 141 mmol/L (ref 135–145)
Total Bilirubin: 0.6 mg/dL (ref 0.3–1.2)
Total Protein: 8 g/dL (ref 6.5–8.1)

## 2020-06-24 LAB — IRON AND TIBC
Iron: 123 ug/dL (ref 41–142)
Saturation Ratios: 41 % (ref 21–57)
TIBC: 303 ug/dL (ref 236–444)
UIBC: 180 ug/dL (ref 120–384)

## 2020-06-24 LAB — MAGNESIUM: Magnesium: 2 mg/dL (ref 1.7–2.4)

## 2020-06-24 LAB — FERRITIN: Ferritin: 28 ng/mL (ref 11–307)

## 2020-06-24 LAB — GENETIC SCREENING ORDER

## 2020-06-24 NOTE — Progress Notes (Signed)
Received approval letter from [employer] for [place] after insurance pays leaving patient with a $0 copay.  Letter given to [facility] for billing/claim submissions.

## 2020-06-24 NOTE — Progress Notes (Signed)
[place] CONSULT NOTE  Patient Care Team: Patient, [name] as PCP - General (General Practice) [name], RN as Oncology Nurse Navigator [name], RN as Oncology Nurse Navigator  CHIEF COMPLAINTS/PURPOSE OF CONSULTATION:  Her 2 amplified breast cancer  ASSESSMENT & PLAN:  No problem-specific Assessment & Plan notes found for this encounter.  Orders Placed This Encounter  Procedures  . CBC with Differential/Platelet    Standing Status:   Standing    Number of Occurrences:   22    Standing Expiration Date:   06/24/2021  . 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 She will have bone scan rescheduled. Given suspicion for IBC and no evidence of distant mets on CT, we will proceed with planned TCHP neoadjuvant for 6 cycles. No concerning review of systems today. Physical examination today palpable right breast mass measuring about 7 cm x 5 and half centimeters, palpable right axillary lymph node with concern for skin invasion We have previously discussed in detail about side effects of chemotherapy. Again she had questions about toxicity, when to reach out to the MD, will she get 6 cycles or 4 cycles. We discussed that depending on her tolerance and response, we could discuss number of cycles. Discussed adverse effects once again including but not limited to fatigue, nausea, vomiting, diarrhea, cytopenias, increased risk of infections, neuropathy.  She understands that she can go to the nearest ER with any concern for infections or fevers. We have reassured that this is standard of care chemotherapy and is very effective in Her 2 amplified breast cancer with excellent pCR rates. We discussed that standard of care is mastectomy if this is indeed treated as IBC after chemotherapy  but she can have a discussion with surgeon regarding this. We have previously talked that she may not be able to conceive after this chemo and she is acceptable to this fact and she doesn't want any more kids.  IDA , ferritin of 45, will continue to monitor.  Genetic testing, screening blood drawn today  Thank you for consulting [place] in the care of this patient.  Please do not hesitate to contact us with any additional questions or concerns.  HISTORY OF PRESENTING ILLNESS:   [name] 45 y.o. 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  This is a very pleasant 45 year old female patient with past medical history significant for iron deficiency anemia secondary to menorrhagia, Crohn's disease status post total colectomy, uterine fibroids, vitamin D deficiency referred to hematology for evaluation and recommendations regarding iron deficiency anemia. She then noticed a breast mass in her right breast which prompted a mammogram.  06/02/2020, Mammo and [place] Numerous concerning hypoechoic masses in the lateral aspect of the right breast. Enlarged right axillary lymph node.  06/03/2020, US guided biopsy of the right breast 9 0 clock mass showed grade III IDC Pathology from right breast 10 0 clock mass showed grade III IDC  Prognostics showed ER 40% positive, weak staining, PR positive 0 %, Her 2 3 +, Ki 67 30% MRI breast showed contiguous masses in the UPPER-OUTER and LOWER-OUTER quadrants of the RIGHT breast, spanning 7.9 centimeters in greatest dimension. 2. Enhancement extends to the RIGHT nipple, suspicious for nipple and/or retroareolar involvement. 3. Significant  skin thickening and skin enhancement of the RIGHT breast. 4. Enlarged RIGHT axillary lymph node with tissue marker clip, consistent with known metastatic disease. 5. Single indeterminate 6 millimeter mass in the LOWER OUTER QUADRANT of the LEFT breast warranting tissue diagnosis. Given the small size,  ultrasound correlate is unlikely, and MR guided biopsy is recommended.  Left breast biopsy showed benign breast tissue with focal fibroadenomatous change, no evidence of malignancy.  CT chest abdomen and pelvis with inflammatory right breast cancer with single enlarged right axillary node. Post surgical changes of left breast. NO convincing evidence of metastatic disease. Endometrial thickening and enhancement out of proportion for a premenopausal patient. Findings could reflect an intracavitary lesion or endometrial hyperplasia/neoplasia. Correlate with any symptoms of bleeding and medication use (such as tamoxifen  Interval History  Patient arrived to the appointment by herself.  Since her last visit, she had CT scans and breast biopsy.  She has been feeling well although she is understandably apprehensive about the upcoming chemotherapy and the potential side effects.  She thinks that the breast mass may have gotten a little smaller overall.  She had her port placed. No new health complaints.  She has her labs done note for today and anticipate first cycle of TCHP tomorrow.  She is hoping that she may not have to do all 6 cycles of chemotherapy. Rest of the pertinent 10 point ROS reviewed and negative.  MEDICAL HISTORY:  Past Medical History:  Diagnosis Date  . 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 Crohn's disease ([place])   . Diarrhea   . Family history of breast cancer 06/11/2020  . 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 Rectal bleeding   . Uterine fibroid   . Vitamin D deficiency 09/24/2013    SURGICAL HISTORY: Past Surgical History:  Procedure Laterality Date  . COLECTOMY  06/27/2019  . COLONOSCOPY  2015    SOCIAL HISTORY: Social History   Socioeconomic History  . Marital status: Single    Spouse name: Not on file  . Number of children: 1  . Years of education: 53  . Highest education level: Not on file  Occupational History   . Occupation: Medical Coder   Tobacco Use  . Smoking status: Former Smoker    Types: Cigarettes  . Smokeless tobacco: Never Used  Vaping Use  . Vaping Use: Never used  Substance and Sexual Activity  . Alcohol use: Yes    Alcohol/week: 1.0 - 2.0 standard drink    Types: 1 - 2 Glasses of wine per week  . Drug use: No  . Sexual activity: Yes    Partners: Male    Comment: Intermittent protection not consistent  Other Topics Concern  . Not on file  Social History Narrative   Single, 1 daughter born 2001 approximately   Medical coder for [employer]   She is a former smoker, occasional alcohol no drug use   Social Determinants of Radio broadcast assistant Strain: Not on file  Food Insecurity: Not on file  Transportation Needs: Not on file  Physical Activity: Not on file  Stress: Not on file  Social Connections: Not on file  Intimate Partner Violence: Not on file    FAMILY HISTORY: Family History  Problem Relation Age of Onset  . Breast cancer Maternal Aunt        dx unknown age  . Diabetes Father   . Diabetes Paternal Aunt   . Breast cancer Cousin  maternal cousin; dx unknown age  . Esophageal cancer Neg Hx   . Rectal cancer Neg Hx   . Stomach cancer Neg Hx   . Colon polyps Neg Hx   . Colon cancer Neg Hx      ALLERGIES:  is allergic to other.  MEDICATIONS:  Current Outpatient Medications  Medication Sig Dispense Refill  . ferrous sulfate 325 (65 FE) MG tablet Take by mouth.    . Multiple Vitamin (MULTIVITAMIN) tablet Take 1 tablet by mouth daily.    Marland Kitchen dexamethasone (DECADRON) 4 MG tablet Take 2 tablets (8 mg total) by mouth 2 (two) times daily. Start the day before Taxotere. Then take daily x 3 days after chemotherapy. 30 tablet 1  . lidocaine-prilocaine (EMLA) cream Apply 1 application topically as needed (Prior to port access). 30 g 2  . lidocaine-prilocaine (EMLA) cream Apply to affected area once 30 g 3  . LORazepam (ATIVAN) 0.5 MG tablet Take 1 tablet  (0.5 mg total) by mouth every 6 (six) hours as needed (Nausea or vomiting). 30 tablet 0  . ondansetron (ZOFRAN) 8 MG tablet Take 1 tablet (8 mg total) by mouth 2 (two) times daily as needed (Nausea or vomiting). Start on the third day after chemotherapy. 30 tablet 1  . pegfilgrastim-bmez (ZIEXTENZO) 6 MG/0.6ML injection Inject 0.6 mLs (6 mg total) into the skin every 21 ( twenty-one) days. First injection on 06/28/2020 0.6 mL 5  . prochlorperazine (COMPAZINE) 10 MG tablet Take 1 tablet (10 mg total) by mouth every 6 (six) hours as needed (Nausea or vomiting). 30 tablet 1   No current facility-administered medications for this visit.     PHYSICAL EXAMINATION: ECOG PERFORMANCE STATUS: 0 - Asymptomatic  Vitals:   06/24/20 0900  BP: (!) 146/95  Pulse: 87  Resp: 16  Temp: 97.9 F (36.6 C)  SpO2: 100%     Filed Weights   06/24/20 0900  Weight: 154 lb 8 oz (70.1 kg)    Physical Exam Constitutional:      Appearance: Normal appearance.  HENT:     Head: Normocephalic and atraumatic.     Mouth/Throat:     Mouth: Mucous membranes are moist.  Eyes:     Extraocular Movements: Extraocular movements intact.  Cardiovascular:     Rate and Rhythm: Normal rate and regular rhythm.     Pulses: Normal pulses.     Heart sounds: Normal heart sounds.  Pulmonary:     Effort: Pulmonary effort is normal.     Breath sounds: Normal breath sounds.  Chest:       Comments: Right breast mass palpable measuring about 5-1/2 x 7 cm.  Breast mass is mobile within the breast.  Skin over the breast is non pinchable suggestive of skin invasion.Palpable right axillary lymphadenopathy measuring around 3 cm, again freely mobile.  Abdominal:     General: Abdomen is flat. Bowel sounds are normal.     Palpations: Abdomen is soft.  Musculoskeletal:     Cervical back: Neck supple.  Lymphadenopathy:     Cervical: No cervical adenopathy.  Neurological:     Mental Status: She is alert.      LABORATORY DATA:   I have reviewed the data as listed Lab Results  Component Value Date   WBC 13.9 (H) 06/24/2020   HGB 11.9 (L) 06/24/2020   HCT 37.6 06/24/2020   MCV 83.7 06/24/2020   PLT 281 06/24/2020     Chemistry      Component Value Date/Time  NA 141 06/24/2020 0948   NA 135 01/21/2020 1056   K 4.2 06/24/2020 0948   CL 106 06/24/2020 0948   CO2 25 06/24/2020 0948   BUN 6 06/24/2020 0948   BUN 12 01/21/2020 1056   CREATININE 0.86 06/24/2020 0948      Component Value Date/Time   CALCIUM 9.4 06/24/2020 0948   ALKPHOS 93 06/24/2020 0948   AST 15 06/24/2020 0948   ALT 12 06/24/2020 0948   BILITOT 0.6 06/24/2020 0948     Reviewed recent biopsy results, CT imaging results  RADIOGRAPHIC STUDIES: I reviewed pertinent imaging reports and pathology reports.  Time I spent 45  minutes in the care of this patient including history, physical, review of records, counseling and coordination of care.     Benay Pike, MD 06/24/2020 10:31 AM

## 2020-06-25 ENCOUNTER — Inpatient Hospital Stay: Payer: Managed Care, Other (non HMO)

## 2020-06-25 ENCOUNTER — Other Ambulatory Visit: Payer: Self-pay

## 2020-06-25 ENCOUNTER — Encounter: Payer: Self-pay | Admitting: Genetic Counselor

## 2020-06-25 ENCOUNTER — Encounter (HOSPITAL_BASED_OUTPATIENT_CLINIC_OR_DEPARTMENT_OTHER): Payer: Self-pay | Admitting: General Surgery

## 2020-06-25 ENCOUNTER — Ambulatory Visit: Payer: Managed Care, Other (non HMO)

## 2020-06-25 VITALS — BP 139/89 | HR 72 | Temp 98.9°F | Resp 16 | Wt 152.0 lb

## 2020-06-25 DIAGNOSIS — Z170 Estrogen receptor positive status [ER+]: Secondary | ICD-10-CM

## 2020-06-25 DIAGNOSIS — Z7189 Other specified counseling: Secondary | ICD-10-CM | POA: Insufficient documentation

## 2020-06-25 DIAGNOSIS — Z1379 Encounter for other screening for genetic and chromosomal anomalies: Secondary | ICD-10-CM | POA: Insufficient documentation

## 2020-06-25 DIAGNOSIS — Z5112 Encounter for antineoplastic immunotherapy: Secondary | ICD-10-CM | POA: Diagnosis not present

## 2020-06-25 MED ORDER — SODIUM CHLORIDE 0.9 % IV SOLN
75.0000 mg/m2 | Freq: Once | INTRAVENOUS | Status: AC
  Administered 2020-06-25: 130 mg via INTRAVENOUS
  Filled 2020-06-25: qty 13

## 2020-06-25 MED ORDER — PALONOSETRON HCL INJECTION 0.25 MG/5ML
INTRAVENOUS | Status: AC
  Filled 2020-06-25: qty 5

## 2020-06-25 MED ORDER — PALONOSETRON HCL INJECTION 0.25 MG/5ML
0.2500 mg | Freq: Once | INTRAVENOUS | Status: AC
  Administered 2020-06-25: 0.25 mg via INTRAVENOUS

## 2020-06-25 MED ORDER — SODIUM CHLORIDE 0.9 % IV SOLN
840.0000 mg | Freq: Once | INTRAVENOUS | Status: AC
  Administered 2020-06-25: 840 mg via INTRAVENOUS
  Filled 2020-06-25: qty 28

## 2020-06-25 MED ORDER — DIPHENHYDRAMINE HCL 25 MG PO CAPS
ORAL_CAPSULE | ORAL | Status: AC
  Filled 2020-06-25: qty 2

## 2020-06-25 MED ORDER — HEPARIN SOD (PORK) LOCK FLUSH 100 UNIT/ML IV SOLN
500.0000 [IU] | Freq: Once | INTRAVENOUS | Status: AC | PRN
  Administered 2020-06-25: 500 [IU]
  Filled 2020-06-25: qty 5

## 2020-06-25 MED ORDER — SODIUM CHLORIDE 0.9 % IV SOLN
150.0000 mg | Freq: Once | INTRAVENOUS | Status: AC
  Administered 2020-06-25: 150 mg via INTRAVENOUS
  Filled 2020-06-25: qty 150

## 2020-06-25 MED ORDER — SODIUM CHLORIDE 0.9% FLUSH
10.0000 mL | INTRAVENOUS | Status: DC | PRN
  Administered 2020-06-25: 10 mL
  Filled 2020-06-25: qty 10

## 2020-06-25 MED ORDER — ACETAMINOPHEN 325 MG PO TABS
650.0000 mg | ORAL_TABLET | Freq: Once | ORAL | Status: AC
  Administered 2020-06-25: 650 mg via ORAL

## 2020-06-25 MED ORDER — DIPHENHYDRAMINE HCL 25 MG PO CAPS
50.0000 mg | ORAL_CAPSULE | Freq: Once | ORAL | Status: AC
  Administered 2020-06-25: 50 mg via ORAL

## 2020-06-25 MED ORDER — ACETAMINOPHEN 325 MG PO TABS
ORAL_TABLET | ORAL | Status: AC
  Filled 2020-06-25: qty 2

## 2020-06-25 MED ORDER — SODIUM CHLORIDE 0.9 % IV SOLN
Freq: Once | INTRAVENOUS | Status: AC
Start: 2020-06-25 — End: 2020-06-25
  Filled 2020-06-25: qty 250

## 2020-06-25 MED ORDER — SODIUM CHLORIDE 0.9 % IV SOLN
10.0000 mg | Freq: Once | INTRAVENOUS | Status: AC
  Administered 2020-06-25: 10 mg via INTRAVENOUS
  Filled 2020-06-25: qty 10

## 2020-06-25 MED ORDER — SODIUM CHLORIDE 0.9 % IV SOLN
8.0000 mg/kg | Freq: Once | INTRAVENOUS | Status: AC
Start: 2020-06-25 — End: 2020-06-25
  Administered 2020-06-25: 567 mg via INTRAVENOUS
  Filled 2020-06-25: qty 27

## 2020-06-25 MED ORDER — SODIUM CHLORIDE 0.9 % IV SOLN
717.6000 mg | Freq: Once | INTRAVENOUS | Status: AC
  Administered 2020-06-25: 720 mg via INTRAVENOUS
  Filled 2020-06-25: qty 72

## 2020-06-25 NOTE — Patient Instructions (Signed)
[place] Discharge Instructions for Patients Receiving Chemotherapy  Today you received the following chemotherapy agents trastuzumab, pertuzumab, docetaxel, carboplatin.   To help prevent nausea and vomiting after your treatment, we encourage you to take your nausea medication as directed.    If you develop nausea and vomiting that is not controlled by your nausea medication, call the clinic.   BELOW ARE SYMPTOMS THAT SHOULD BE REPORTED IMMEDIATELY:  *FEVER GREATER THAN 100.5 F  *CHILLS WITH OR WITHOUT FEVER  NAUSEA AND VOMITING THAT IS NOT CONTROLLED WITH YOUR NAUSEA MEDICATION  *UNUSUAL SHORTNESS OF BREATH  *UNUSUAL BRUISING OR BLEEDING  TENDERNESS IN MOUTH AND THROAT WITH OR WITHOUT PRESENCE OF ULCERS  *URINARY PROBLEMS  *BOWEL PROBLEMS  UNUSUAL RASH Items with * indicate a potential emergency and should be followed up as soon as possible.  Feel free to call the clinic should you have any questions or concerns. The clinic phone number is [phone].  Please show the [facility] at check-in to the Emergency Department and triage nurse.  Trastuzumab injection for infusion What is this medicine? TRASTUZUMAB (tras TOO zoo mab) is a monoclonal antibody. It is used to treat breast cancer and stomach cancer. This medicine may be used for other purposes; ask your health care provider or pharmacist if you have questions. COMMON BRAND NAME(S): Herceptin, Galvin Proffer, Trazimera What should I tell my health care provider before I take this medicine? They need to know if you have any of these conditions:  heart disease  heart failure  lung or breathing disease, like asthma  an unusual or allergic reaction to trastuzumab, benzyl alcohol, or other medications, foods, dyes, or preservatives  pregnant or trying to get pregnant  breast-feeding How should I use this medicine? This drug is given as an infusion into a vein. It is  administered in a hospital or clinic by a specially trained health care professional. Talk to your pediatrician regarding the use of this medicine in children. This medicine is not approved for use in children. Overdosage: If you think you have taken too much of this medicine contact a poison control center or emergency room at once. NOTE: This medicine is only for you. Do not share this medicine with others. What if I miss a dose? It is important not to miss a dose. Call your doctor or health care professional if you are unable to keep an appointment. What may interact with this medicine? This medicine may interact with the following medications:  certain types of chemotherapy, such as daunorubicin, doxorubicin, epirubicin, and idarubicin This list may not describe all possible interactions. Give your health care provider a list of all the medicines, herbs, non-prescription drugs, or dietary supplements you use. Also tell them if you smoke, drink alcohol, or use illegal drugs. Some items may interact with your medicine. What should I watch for while using this medicine? Visit your doctor for checks on your progress. Report any side effects. Continue your course of treatment even though you feel ill unless your doctor tells you to stop. Call your doctor or health care professional for advice if you get a fever, chills or sore throat, or other symptoms of a cold or flu. Do not treat yourself. Try to avoid being around people who are sick. You may experience fever, chills and shaking during your first infusion. These effects are usually mild and can be treated with other medicines. Report any side effects during the infusion to your health  care professional. Fever and chills usually do not happen with later infusions. Do not become pregnant while taking this medicine or for 7 months after stopping it. Women should inform their doctor if they wish to become pregnant or think they might be pregnant. Women  of child-bearing potential will need to have a negative pregnancy test before starting this medicine. There is a potential for serious side effects to an unborn child. Talk to your health care professional or pharmacist for more information. Do not breast-feed an infant while taking this medicine or for 7 months after stopping it. Women must use effective birth control with this medicine. What side effects may I notice from receiving this medicine? Side effects that you should report to your doctor or health care professional as soon as possible:  allergic reactions like skin rash, itching or hives, swelling of the face, lips, or tongue  chest pain or palpitations  cough  dizziness  feeling faint or lightheaded, falls  fever  general ill feeling or flu-like symptoms  signs of worsening heart failure like breathing problems; swelling in your legs and feet  unusually weak or tired Side effects that usually do not require medical attention (report to your doctor or health care professional if they continue or are bothersome):  bone pain  changes in taste  diarrhea  joint pain  nausea/vomiting  weight loss This list may not describe all possible side effects. Call your doctor for medical advice about side effects. You may report side effects to FDA at 1-800-FDA-1088. Where should I keep my medicine? This drug is given in a hospital or clinic and will not be stored at home. NOTE: This sheet is a summary. It may not cover all possible information. If you have questions about this medicine, talk to your doctor, pharmacist, or health care provider.  2021 Elsevier/Gold Standard (2016-03-21 14:37:52)  Pertuzumab injection What is this medicine? PERTUZUMAB (per TOOZ ue mab) is a monoclonal antibody. It is used to treat breast cancer. This medicine may be used for other purposes; ask your health care provider or pharmacist if you have questions. COMMON BRAND NAME(S): PERJETA What  should I tell my health care provider before I take this medicine? They need to know if you have any of these conditions:  heart disease  heart failure  high blood pressure  history of irregular heart beat  recent or ongoing radiation therapy  an unusual or allergic reaction to pertuzumab, other medicines, foods, dyes, or preservatives  pregnant or trying to get pregnant  breast-feeding How should I use this medicine? This medicine is for infusion into a vein. It is given by a health care professional in a hospital or clinic setting. Talk to your pediatrician regarding the use of this medicine in children. Special care may be needed. Overdosage: If you think you have taken too much of this medicine contact a poison control center or emergency room at once. NOTE: This medicine is only for you. Do not share this medicine with others. What if I miss a dose? It is important not to miss your dose. Call your doctor or health care professional if you are unable to keep an appointment. What may interact with this medicine? Interactions are not expected. Give your health care provider a list of all the medicines, herbs, non-prescription drugs, or dietary supplements you use. Also tell them if you smoke, drink alcohol, or use illegal drugs. Some items may interact with your medicine. This list may not describe all  possible interactions. Give your health care provider a list of all the medicines, herbs, non-prescription drugs, or dietary supplements you use. Also tell them if you smoke, drink alcohol, or use illegal drugs. Some items may interact with your medicine. What should I watch for while using this medicine? Your condition will be monitored carefully while you are receiving this medicine. Report any side effects. Continue your course of treatment even though you feel ill unless your doctor tells you to stop. Do not become pregnant while taking this medicine or for 7 months after stopping  it. Women should inform their doctor if they wish to become pregnant or think they might be pregnant. Women of child-bearing potential will need to have a negative pregnancy test before starting this medicine. There is a potential for serious side effects to an unborn child. Talk to your health care professional or pharmacist for more information. Do not breast-feed an infant while taking this medicine or for 7 months after stopping it. Women must use effective birth control with this medicine. Call your doctor or health care professional for advice if you get a fever, chills or sore throat, or other symptoms of a cold or flu. Do not treat yourself. Try to avoid being around people who are sick. You may experience fever, chills, and headache during the infusion. Report any side effects during the infusion to your health care professional. What side effects may I notice from receiving this medicine? Side effects that you should report to your doctor or health care professional as soon as possible:  breathing problems  chest pain or palpitations  dizziness  feeling faint or lightheaded  fever or chills  skin rash, itching or hives  sore throat  swelling of the face, lips, or tongue  swelling of the legs or ankles  unusually weak or tired Side effects that usually do not require medical attention (report to your doctor or health care professional if they continue or are bothersome):  diarrhea  hair loss  nausea, vomiting  tiredness This list may not describe all possible side effects. Call your doctor for medical advice about side effects. You may report side effects to FDA at 1-800-FDA-1088. Where should I keep my medicine? This drug is given in a hospital or clinic and will not be stored at home. NOTE: This sheet is a summary. It may not cover all possible information. If you have questions about this medicine, talk to your doctor, pharmacist, or health care provider.  2021  Elsevier/Gold Standard (2015-04-29 12:08:50)  Docetaxel injection What is this medicine? DOCETAXEL (doe se TAX el) is a chemotherapy drug. It targets fast dividing cells, like cancer cells, and causes these cells to die. This medicine is used to treat many types of cancers like breast cancer, certain stomach cancers, head and neck cancer, lung cancer, and prostate cancer. This medicine may be used for other purposes; ask your health care provider or pharmacist if you have questions. COMMON BRAND NAME(S): Docefrez, Taxotere What should I tell my health care provider before I take this medicine? They need to know if you have any of these conditions:  infection (especially a virus infection such as chickenpox, cold sores, or herpes)  liver disease  low blood counts, like low white cell, platelet, or red cell counts  an unusual or allergic reaction to docetaxel, polysorbate 80, other chemotherapy agents, other medicines, foods, dyes, or preservatives  pregnant or trying to get pregnant  breast-feeding How should I use this medicine? This  drug is given as an infusion into a vein. It is administered in a hospital or clinic by a specially trained health care professional. Talk to your pediatrician regarding the use of this medicine in children. Special care may be needed. Overdosage: If you think you have taken too much of this medicine contact a poison control center or emergency room at once. NOTE: This medicine is only for you. Do not share this medicine with others. What if I miss a dose? It is important not to miss your dose. Call your doctor or health care professional if you are unable to keep an appointment. What may interact with this medicine? Do not take this medicine with any of the following medications:  live virus vaccines This medicine may also interact with the following medications:  aprepitant  certain antibiotics like erythromycin or clarithromycin  certain  antivirals for HIV or hepatitis  certain medicines for fungal infections like fluconazole, itraconazole, ketoconazole, posaconazole, or voriconazole  cimetidine  ciprofloxacin  conivaptan  cyclosporine  dronedarone  fluvoxamine  grapefruit juice  imatinib  verapamil This list may not describe all possible interactions. Give your health care provider a list of all the medicines, herbs, non-prescription drugs, or dietary supplements you use. Also tell them if you smoke, drink alcohol, or use illegal drugs. Some items may interact with your medicine. What should I watch for while using this medicine? Your condition will be monitored carefully while you are receiving this medicine. You will need important blood work done while you are taking this medicine. Call your doctor or health care professional for advice if you get a fever, chills or sore throat, or other symptoms of a cold or flu. Do not treat yourself. This drug decreases your body's ability to fight infections. Try to avoid being around people who are sick. Some products may contain alcohol. Ask your health care professional if this medicine contains alcohol. Be sure to tell all health care professionals you are taking this medicine. Certain medicines, like metronidazole and disulfiram, can cause an unpleasant reaction when taken with alcohol. The reaction includes flushing, headache, nausea, vomiting, sweating, and increased thirst. The reaction can last from 30 minutes to several hours. You may get drowsy or dizzy. Do not drive, use machinery, or do anything that needs mental alertness until you know how this medicine affects you. Do not stand or sit up quickly, especially if you are an older patient. This reduces the risk of dizzy or fainting spells. Alcohol may interfere with the effect of this medicine. Talk to your health care professional about your risk of cancer. You may be more at risk for certain types of cancer if you  take this medicine. Do not become pregnant while taking this medicine or for 6 months after stopping it. Women should inform their doctor if they wish to become pregnant or think they might be pregnant. There is a potential for serious side effects to an unborn child. Talk to your health care professional or pharmacist for more information. Do not breast-feed an infant while taking this medicine or for 1 week after stopping it. Males who get this medicine must use a condom during sex with females who can get pregnant. If you get a woman pregnant, the baby could have birth defects. The baby could die before they are born. You will need to continue wearing a condom for 3 months after stopping the medicine. Tell your health care provider right away if your partner becomes pregnant while you  are taking this medicine. This may interfere with the ability to father a child. You should talk to your doctor or health care professional if you are concerned about your fertility. What side effects may I notice from receiving this medicine? Side effects that you should report to your doctor or health care professional as soon as possible:  allergic reactions like skin rash, itching or hives, swelling of the face, lips, or tongue  blurred vision  breathing problems  changes in vision  low blood counts - This drug may decrease the number of white blood cells, red blood cells and platelets. You may be at increased risk for infections and bleeding.  nausea and vomiting  pain, redness or irritation at site where injected  pain, tingling, numbness in the hands or feet  redness, blistering, peeling, or loosening of the skin, including inside the mouth  signs of decreased platelets or bleeding - bruising, pinpoint red spots on the skin, black, tarry stools, nosebleeds  signs of decreased red blood cells - unusually weak or tired, fainting spells, lightheadedness  signs of infection - fever or chills, cough,  sore throat, pain or difficulty passing urine  swelling of the ankle, feet, hands Side effects that usually do not require medical attention (report to your doctor or health care professional if they continue or are bothersome):  constipation  diarrhea  fingernail or toenail changes  hair loss  loss of appetite  mouth sores  muscle pain This list may not describe all possible side effects. Call your doctor for medical advice about side effects. You may report side effects to FDA at 1-800-FDA-1088. Where should I keep my medicine? This drug is given in a hospital or clinic and will not be stored at home. NOTE: This sheet is a summary. It may not cover all possible information. If you have questions about this medicine, talk to your doctor, pharmacist, or health care provider.  2021 Elsevier/Gold Standard (2019-02-24 19:50:31)  Carboplatin injection What is this medicine? CARBOPLATIN (KAR boe pla tin) is a chemotherapy drug. It targets fast dividing cells, like cancer cells, and causes these cells to die. This medicine is used to treat ovarian cancer and many other cancers. This medicine may be used for other purposes; ask your health care provider or pharmacist if you have questions. COMMON BRAND NAME(S): Paraplatin What should I tell my health care provider before I take this medicine? They need to know if you have any of these conditions:  blood disorders  hearing problems  kidney disease  recent or ongoing radiation therapy  an unusual or allergic reaction to carboplatin, cisplatin, other chemotherapy, other medicines, foods, dyes, or preservatives  pregnant or trying to get pregnant  breast-feeding How should I use this medicine? This drug is usually given as an infusion into a vein. It is administered in a hospital or clinic by a specially trained health care professional. Talk to your pediatrician regarding the use of this medicine in children. Special care may be  needed. Overdosage: If you think you have taken too much of this medicine contact a poison control center or emergency room at once. NOTE: This medicine is only for you. Do not share this medicine with others. What if I miss a dose? It is important not to miss a dose. Call your doctor or health care professional if you are unable to keep an appointment. What may interact with this medicine?  medicines for seizures  medicines to increase blood counts like filgrastim, pegfilgrastim,  sargramostim  some antibiotics like amikacin, gentamicin, neomycin, streptomycin, tobramycin  vaccines Talk to your doctor or health care professional before taking any of these medicines:  acetaminophen  aspirin  ibuprofen  ketoprofen  naproxen This list may not describe all possible interactions. Give your health care provider a list of all the medicines, herbs, non-prescription drugs, or dietary supplements you use. Also tell them if you smoke, drink alcohol, or use illegal drugs. Some items may interact with your medicine. What should I watch for while using this medicine? Your condition will be monitored carefully while you are receiving this medicine. You will need important blood work done while you are taking this medicine. This drug may make you feel generally unwell. This is not uncommon, as chemotherapy can affect healthy cells as well as cancer cells. Report any side effects. Continue your course of treatment even though you feel ill unless your doctor tells you to stop. In some cases, you may be given additional medicines to help with side effects. Follow all directions for their use. Call your doctor or health care professional for advice if you get a fever, chills or sore throat, or other symptoms of a cold or flu. Do not treat yourself. This drug decreases your body's ability to fight infections. Try to avoid being around people who are sick. This medicine may increase your risk to bruise or  bleed. Call your doctor or health care professional if you notice any unusual bleeding. Be careful brushing and flossing your teeth or using a toothpick because you may get an infection or bleed more easily. If you have any dental work done, tell your dentist you are receiving this medicine. Avoid taking products that contain aspirin, acetaminophen, ibuprofen, naproxen, or ketoprofen unless instructed by your doctor. These medicines may hide a fever. Do not become pregnant while taking this medicine. Women should inform their doctor if they wish to become pregnant or think they might be pregnant. There is a potential for serious side effects to an unborn child. Talk to your health care professional or pharmacist for more information. Do not breast-feed an infant while taking this medicine. What side effects may I notice from receiving this medicine? Side effects that you should report to your doctor or health care professional as soon as possible:  allergic reactions like skin rash, itching or hives, swelling of the face, lips, or tongue  signs of infection - fever or chills, cough, sore throat, pain or difficulty passing urine  signs of decreased platelets or bleeding - bruising, pinpoint red spots on the skin, black, tarry stools, nosebleeds  signs of decreased red blood cells - unusually weak or tired, fainting spells, lightheadedness  breathing problems  changes in hearing  changes in vision  chest pain  high blood pressure  low blood counts - This drug may decrease the number of white blood cells, red blood cells and platelets. You may be at increased risk for infections and bleeding.  nausea and vomiting  pain, swelling, redness or irritation at the injection site  pain, tingling, numbness in the hands or feet  problems with balance, talking, walking  trouble passing urine or change in the amount of urine Side effects that usually do not require medical attention (report to  your doctor or health care professional if they continue or are bothersome):  hair loss  loss of appetite  metallic taste in the mouth or changes in taste This list may not describe all possible side effects. Call your  doctor for medical advice about side effects. You may report side effects to FDA at 1-800-FDA-1088. Where should I keep my medicine? This drug is given in a hospital or clinic and will not be stored at home. NOTE: This sheet is a summary. It may not cover all possible information. If you have questions about this medicine, talk to your doctor, pharmacist, or health care provider.  2021 Elsevier/Gold Standard (2007-07-02 14:38:05)

## 2020-06-26 ENCOUNTER — Ambulatory Visit: Payer: Managed Care, Other (non HMO)

## 2020-06-28 ENCOUNTER — Encounter: Payer: Self-pay | Admitting: Hematology and Oncology

## 2020-06-28 ENCOUNTER — Inpatient Hospital Stay: Payer: Managed Care, Other (non HMO)

## 2020-06-28 ENCOUNTER — Telehealth: Payer: Self-pay | Admitting: Genetic Counselor

## 2020-06-28 ENCOUNTER — Ambulatory Visit: Payer: Self-pay | Admitting: Genetic Counselor

## 2020-06-28 ENCOUNTER — Other Ambulatory Visit: Payer: Self-pay

## 2020-06-28 VITALS — BP 137/80 | HR 82 | Resp 18

## 2020-06-28 DIAGNOSIS — Z1379 Encounter for other screening for genetic and chromosomal anomalies: Secondary | ICD-10-CM

## 2020-06-28 DIAGNOSIS — Z803 Family history of malignant neoplasm of breast: Secondary | ICD-10-CM

## 2020-06-28 DIAGNOSIS — Z170 Estrogen receptor positive status [ER+]: Secondary | ICD-10-CM

## 2020-06-28 MED ORDER — PEGFILGRASTIM-BMEZ 6 MG/0.6ML ~~LOC~~ SOSY
PREFILLED_SYRINGE | SUBCUTANEOUS | Status: AC
  Filled 2020-06-28: qty 0.6

## 2020-06-28 MED ORDER — PEGFILGRASTIM-BMEZ 6 MG/0.6ML ~~LOC~~ SOSY
6.0000 mg | PREFILLED_SYRINGE | Freq: Once | SUBCUTANEOUS | Status: AC
  Administered 2020-06-28: 6 mg via SUBCUTANEOUS

## 2020-06-28 NOTE — Progress Notes (Signed)
HPI:  Ms. Cassarino was previously seen in the [facility] clinic due to a personal and family history of cancer and concerns regarding a hereditary predisposition to cancer. Please refer to our prior cancer genetics clinic note for more information regarding our discussion, assessment and recommendations, at the time. Ms. Defranco recent genetic test results were disclosed to her, as were recommendations warranted by these results. These results and recommendations are discussed in more detail below.  CANCER HISTORY:  In February 2022, at the age of 45, Ms. 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 Oncology History  Malignant neoplasm of upper-outer quadrant of right breast in female, estrogen receptor positive ([place])  06/04/2020 Initial Diagnosis   Malignant neoplasm of upper-outer quadrant of right breast in female, estrogen receptor positive ([place])   06/24/2020 Genetic Testing   No pathogenic variants detected in [facility]-Cancer +RNAinsight.  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  The report date is June 24, 2020.   The CustomNext-Cancer+RNAinsight panel offered by [facility] includes sequencing and rearrangement analysis for the following 47 genes:  APC, ATM, AXIN2, BARD1, BMPR1A, BRCA1, BRCA2, BRIP1, CDH1, CDK4, CDKN2A, CHEK2, DICER1, EPCAM, GREM1, HOXB13, MEN1, MLH1, MSH2, MSH3, MSH6, MUTYH, NBN, NF1, NF2, NTHL1, PALB2, PMS2, POLD1, POLE, PTEN, RAD51C, RAD51D, RECQL, RET, SDHA, SDHAF2, SDHB, SDHC, SDHD, SMAD4, SMARCA4, STK11, TP53, TSC1, TSC2, and VHL.  RNA data is routinely analyzed for use in variant interpretation for all genes.   06/25/2020 -  Chemotherapy    Patient is on Treatment Plan: BREAST  DOCETAXEL + CARBOPLATIN + TRASTUZUMAB + PERTUZUMAB  (TCHP) Q21D         FAMILY HISTORY:  We obtained a detailed, 4-generation family history.  Significant  diagnoses are listed below: Family History  Problem Relation Age of Onset  . Breast cancer Maternal Aunt        dx unknown age  . Breast cancer Cousin        maternal cousin; dx unknown age     Ms. Fairburn has one full brother, two maternal half brothers, and six maternal half sisters, all without a history of cancer.  Ms. Slider mother passed away before the age of 31 and did not have cancer.  Ms. Delellis has a maternal aunt and maternal cousin with breast cancer diagnosed at unknown ages.  Ms. Mangano father is 25 without cancer.  No paternal family history of cancer was reported.   Ms. Ladouceur is unaware of previous family history of genetic testing for hereditary cancer risks. There is no reported Ashkenazi Jewish ancestry. There is no known consanguinity.  GENETIC TEST RESULTS: Genetic testing reported out on June 24, 2020.  The  [facility] CustomNext-Cancer +RNAinsight Panel found no pathogenic mutations. The CustomNext-Cancer+RNAinsight panel offered by [facility] includes sequencing and rearrangement analysis for the following 47 genes:  APC, ATM, AXIN2, BARD1, BMPR1A, BRCA1, BRCA2, BRIP1, CDH1, CDK4, CDKN2A, CHEK2, DICER1, EPCAM, GREM1, HOXB13, MEN1, MLH1, MSH2, MSH3, MSH6, MUTYH, NBN, NF1, NF2, NTHL1, PALB2, PMS2, POLD1, POLE, PTEN, RAD51C, RAD51D, RECQL, RET, SDHA, SDHAF2, SDHB, SDHC, SDHD, SMAD4, SMARCA4, STK11, TP53, TSC1, TSC2, and VHL.  RNA data is routinely analyzed for use in variant interpretation for all genes.  The test report has been scanned into EPIC and is located under the Molecular Pathology section of the Results Review tab.  A portion of the result report is included below for reference.  We discussed with Ms. Brundage that because current genetic testing is not perfect, it is possible there may be a gene mutation in one of these genes that current testing cannot detect, but that chance is small.  We also discussed, that there could be another gene that has not yet  been discovered, or that we have not yet tested, that is responsible for the cancer diagnoses in the family. It is also possible there is a hereditary cause for the cancer in the family that Ms. Klink did not inherit and therefore was not identified in her testing.  Therefore, it is important to remain in touch with cancer genetics in the future so that we can continue to offer Ms. Mullinax the most up to date genetic testing.   Genetic testing did identify a variant of uncertain significance (VUS) was identified in the PALB2 gene called c.109C>A (p.R37S).  At this time, it is unknown if this variant is associated with increased cancer risk or if this is a normal finding, but most variants such as this get reclassified to being inconsequential. It should not be used to make medical management decisions. With time, we suspect the lab will determine the significance of this variant, if any. If we do learn more about it, we will try to contact Ms. Klassen to discuss it further. However, it is important to stay in touch with [place] periodically and keep the address and phone number up to date.  ADDITIONAL GENETIC TESTING: We discussed with Ms. Pollet that there are other genes that are associated with increased cancer risk that can be analyzed. Should Ms. Granville wish to pursue additional genetic testing, we are happy to discuss and coordinate this testing, at any time.    CANCER SCREENING RECOMMENDATIONS: Ms. Kaman test result is considered negative (normal).  This means that we have not identified a hereditary cause for her personal history of cancer at this time. Most cancers happen by chance and this negative test suggests that her cancer may fall into this category.    This does not definitively rule out a hereditary predisposition to cancer. It is still possible that there could be genetic mutations that are undetectable by current technology. There could be genetic mutations in genes that have not been tested  or identified to increase cancer risk.  Therefore, it is recommended she continue to follow the cancer management and screening guidelines provided by her oncology and primary healthcare provider.   An individual's cancer risk and medical management are not determined by genetic test results alone. Overall cancer risk assessment incorporates additional factors, including personal medical history, family history, and any available genetic information that may result in a personalized plan for cancer prevention and surveillance  RECOMMENDATIONS FOR FAMILY MEMBERS:  Individuals in this family might be at some increased risk of developing cancer, over the general population risk, simply due to the family history of cancer.  We recommended women in this family have a yearly mammogram beginning at age 43, or 24 years younger than the earliest onset of cancer, an annual clinical breast exam, and perform monthly breast self-exams. Women in this family should also have a gynecological exam as recommended by their primary provider. All family members should be referred for colonoscopy starting at age 65.  FOLLOW-UP: Lastly, we discussed with Ms. Corbit that cancer genetics is a rapidly advancing field and it is possible that new genetic tests will be appropriate for her and/or her family members in the future. We encouraged  her to remain in contact with cancer genetics on an annual basis so we can update her personal and family histories and let her know of advances in cancer genetics that may benefit this family.   Our contact number was provided. Ms. Junkins questions were answered to her satisfaction, and she knows she is welcome to call us at anytime with additional questions or concerns.    Mellanie Bejarano M. Joette Catching, [place], [facility] Genetic Counselor [email or web address] (P) [phone]

## 2020-06-28 NOTE — Patient Instructions (Signed)
Pegfilgrastim injection What is this medicine? PEGFILGRASTIM (PEG fil gra stim) is a long-acting granulocyte colony-stimulating factor that stimulates the growth of neutrophils, a type of white blood cell important in the body's fight against infection. It is used to reduce the incidence of fever and infection in patients with certain types of cancer who are receiving chemotherapy that affects the bone marrow, and to increase survival after being exposed to high doses of radiation. This medicine may be used for other purposes; ask your health care provider or pharmacist if you have questions. COMMON BRAND NAME(S): Rexene Edison, Ziextenzo What should I tell my health care provider before I take this medicine? They need to know if you have any of these conditions:  kidney disease  latex allergy  ongoing radiation therapy  sickle cell disease  skin reactions to acrylic adhesives (On-Body Injector only)  an unusual or allergic reaction to pegfilgrastim, filgrastim, other medicines, foods, dyes, or preservatives  pregnant or trying to get pregnant  breast-feeding How should I use this medicine? This medicine is for injection under the skin. If you get this medicine at home, you will be taught how to prepare and give the pre-filled syringe or how to use the On-body Injector. Refer to the patient Instructions for Use for detailed instructions. Use exactly as directed. Tell your healthcare provider immediately if you suspect that the On-body Injector may not have performed as intended or if you suspect the use of the On-body Injector resulted in a missed or partial dose. It is important that you put your used needles and syringes in a special sharps container. Do not put them in a trash can. If you do not have a sharps container, call your pharmacist or healthcare provider to get one. Talk to your pediatrician regarding the use of this medicine in children. While this drug  may be prescribed for selected conditions, precautions do apply. Overdosage: If you think you have taken too much of this medicine contact a poison control center or emergency room at once. NOTE: This medicine is only for you. Do not share this medicine with others. What if I miss a dose? It is important not to miss your dose. Call your doctor or health care professional if you miss your dose. If you miss a dose due to an On-body Injector failure or leakage, a new dose should be administered as soon as possible using a single prefilled syringe for manual use. What may interact with this medicine? Interactions have not been studied. This list may not describe all possible interactions. Give your health care provider a list of all the medicines, herbs, non-prescription drugs, or dietary supplements you use. Also tell them if you smoke, drink alcohol, or use illegal drugs. Some items may interact with your medicine. What should I watch for while using this medicine? Your condition will be monitored carefully while you are receiving this medicine. You may need blood work done while you are taking this medicine. Talk to your health care provider about your risk of cancer. You may be more at risk for certain types of cancer if you take this medicine. If you are going to need a MRI, CT scan, or other procedure, tell your doctor that you are using this medicine (On-Body Injector only). What side effects may I notice from receiving this medicine? Side effects that you should report to your doctor or health care professional as soon as possible:  allergic reactions (skin rash, itching or hives, swelling of  the face, lips, or tongue)  back pain  dizziness  fever  pain, redness, or irritation at site where injected  pinpoint red spots on the skin  red or dark-brown urine  shortness of breath or breathing problems  stomach or side pain, or pain at the shoulder  swelling  tiredness  trouble  passing urine or change in the amount of urine  unusual bruising or bleeding Side effects that usually do not require medical attention (report to your doctor or health care professional if they continue or are bothersome):  bone pain  muscle pain This list may not describe all possible side effects. Call your doctor for medical advice about side effects. You may report side effects to FDA at 1-800-FDA-1088. Where should I keep my medicine? Keep out of the reach of children. If you are using this medicine at home, you will be instructed on how to store it. Throw away any unused medicine after the expiration date on the label. NOTE: This sheet is a summary. It may not cover all possible information. If you have questions about this medicine, talk to your doctor, pharmacist, or health care provider.  2021 Elsevier/Gold Standard (2019-04-18 13:20:51)

## 2020-06-28 NOTE — Telephone Encounter (Signed)
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  Discussed that we do not know why she has breast cancer or why there is cancer in the family. It could be familial, due to a different gene that we are not testing, or maybe our current technology may not be able to pick something up.  It will be important for her to keep in contact with genetics to keep up with whether additional testing may be needed.

## 2020-06-29 ENCOUNTER — Telehealth: Payer: Self-pay | Admitting: *Deleted

## 2020-06-29 ENCOUNTER — Encounter: Payer: Self-pay | Admitting: Hematology and Oncology

## 2020-06-29 ENCOUNTER — Other Ambulatory Visit: Payer: Self-pay

## 2020-06-29 ENCOUNTER — Encounter (HOSPITAL_COMMUNITY): Payer: Self-pay

## 2020-06-29 ENCOUNTER — Telehealth: Payer: Self-pay

## 2020-06-29 DIAGNOSIS — D701 Agranulocytosis secondary to cancer chemotherapy: Secondary | ICD-10-CM | POA: Diagnosis present

## 2020-06-29 DIAGNOSIS — Z170 Estrogen receptor positive status [ER+]: Secondary | ICD-10-CM

## 2020-06-29 DIAGNOSIS — K5010 Crohn's disease of large intestine without complications: Secondary | ICD-10-CM | POA: Diagnosis present

## 2020-06-29 DIAGNOSIS — K521 Toxic gastroenteritis and colitis: Principal | ICD-10-CM | POA: Diagnosis present

## 2020-06-29 DIAGNOSIS — Z20822 Contact with and (suspected) exposure to covid-19: Secondary | ICD-10-CM | POA: Diagnosis present

## 2020-06-29 DIAGNOSIS — R823 Hemoglobinuria: Secondary | ICD-10-CM | POA: Diagnosis present

## 2020-06-29 DIAGNOSIS — Z79899 Other long term (current) drug therapy: Secondary | ICD-10-CM

## 2020-06-29 DIAGNOSIS — Z803 Family history of malignant neoplasm of breast: Secondary | ICD-10-CM

## 2020-06-29 DIAGNOSIS — Z87891 Personal history of nicotine dependence: Secondary | ICD-10-CM

## 2020-06-29 DIAGNOSIS — K567 Ileus, unspecified: Secondary | ICD-10-CM | POA: Diagnosis present

## 2020-06-29 DIAGNOSIS — Z9049 Acquired absence of other specified parts of digestive tract: Secondary | ICD-10-CM

## 2020-06-29 DIAGNOSIS — E876 Hypokalemia: Secondary | ICD-10-CM | POA: Diagnosis present

## 2020-06-29 DIAGNOSIS — D649 Anemia, unspecified: Secondary | ICD-10-CM | POA: Diagnosis present

## 2020-06-29 DIAGNOSIS — Z833 Family history of diabetes mellitus: Secondary | ICD-10-CM

## 2020-06-29 DIAGNOSIS — T451X5A Adverse effect of antineoplastic and immunosuppressive drugs, initial encounter: Secondary | ICD-10-CM | POA: Diagnosis present

## 2020-06-29 DIAGNOSIS — C50411 Malignant neoplasm of upper-outer quadrant of right female breast: Secondary | ICD-10-CM | POA: Diagnosis present

## 2020-06-29 DIAGNOSIS — D259 Leiomyoma of uterus, unspecified: Secondary | ICD-10-CM | POA: Diagnosis present

## 2020-06-29 NOTE — Telephone Encounter (Signed)
-----   Message from Benay Pike, MD sent at 06/29/2020  2:30 PM EDT ----- Yes she can certainly take tylenol for headaches and bone pain. This is likely from growth factor we give to keep her WBC up. I dont believe this is necessarily from chemotherapy.  Blood in stool is not a common side effect of chemo. Do we know if she is having diarrhea or severe constipation.  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 She states that she has not seen blood in her stool since her colon was removed last year. The patient would like to know 1) can she take tylenol for her headaches & bone pain 2) is the blood in her stool normal after chemo or should she go to the ED?  Please advise, thank you.

## 2020-06-29 NOTE — Telephone Encounter (Signed)
-----   Message from Isabel Caprice, RN sent at 06/25/2020  3:27 PM EDT ----- Regarding: First time TCHP, [name] patient. Patient received Trastuzumab, perjeta, taxotere, and carboplatin today. She tolerated infusion well, please follow up tomorrow. Thank you!

## 2020-06-29 NOTE — Telephone Encounter (Signed)
I called the [name] to go over her symptoms and to see how she was feeling. According to the [name] she has been staying very hydrated, has not had any diarrhea, and denies constipation. The [name] states that she is currently menstruating but she is sure that she is also bleeding from her rectum. The [name] reports that her vaginal bleeding is minimum and the blood coming from her rectum is light pink. We also discussed that the Neulasta is more than likely causing her bone pain/headaches and I advised her to take Tylenol prn but to avoid NSAIDs right now with the potential for rectal bleeding. Per [name] an appointment was offered to the [name] to be seen in the clinic tomorrow but the [name] denied. [name] states that she will call us tomorrow with an update for [name] on how she is feeling and she plans to follow up with her on 07/01/20.

## 2020-06-29 NOTE — ED Triage Notes (Signed)
Pt reports first chemotherapy treatment this Friday and since then has experienced bone aches and bright red blood in stool.

## 2020-06-29 NOTE — Telephone Encounter (Signed)
Called pt to see how she did with her recent treatment & she reports doing well except fatigue.  Discussed nutrition, hydration, exercise, & rest.  She denies any other problems & is working.  She knows how to reach [place].

## 2020-06-30 ENCOUNTER — Inpatient Hospital Stay (HOSPITAL_COMMUNITY)
Admission: EM | Admit: 2020-06-30 | Discharge: 2020-07-02 | DRG: 394 | Disposition: A | Discharge status: Home / Self Care | Payer: Managed Care, Other (non HMO) | Attending: Internal Medicine | Admitting: Internal Medicine

## 2020-06-30 ENCOUNTER — Emergency Department (HOSPITAL_COMMUNITY): Payer: Managed Care, Other (non HMO)

## 2020-06-30 ENCOUNTER — Encounter (HOSPITAL_COMMUNITY): Payer: Self-pay

## 2020-06-30 ENCOUNTER — Telehealth: Payer: Self-pay | Admitting: Medical Oncology

## 2020-06-30 DIAGNOSIS — K625 Hemorrhage of anus and rectum: Secondary | ICD-10-CM | POA: Diagnosis not present

## 2020-06-30 DIAGNOSIS — D709 Neutropenia, unspecified: Secondary | ICD-10-CM | POA: Diagnosis not present

## 2020-06-30 DIAGNOSIS — K5090 Crohn's disease, unspecified, without complications: Secondary | ICD-10-CM | POA: Diagnosis present

## 2020-06-30 DIAGNOSIS — K529 Noninfective gastroenteritis and colitis, unspecified: Secondary | ICD-10-CM | POA: Diagnosis present

## 2020-06-30 DIAGNOSIS — C50411 Malignant neoplasm of upper-outer quadrant of right female breast: Secondary | ICD-10-CM

## 2020-06-30 DIAGNOSIS — K6289 Other specified diseases of anus and rectum: Secondary | ICD-10-CM | POA: Diagnosis not present

## 2020-06-30 DIAGNOSIS — E876 Hypokalemia: Secondary | ICD-10-CM | POA: Diagnosis present

## 2020-06-30 DIAGNOSIS — K5010 Crohn's disease of large intestine without complications: Secondary | ICD-10-CM | POA: Diagnosis present

## 2020-06-30 DIAGNOSIS — Z20822 Contact with and (suspected) exposure to covid-19: Secondary | ICD-10-CM | POA: Diagnosis present

## 2020-06-30 DIAGNOSIS — D259 Leiomyoma of uterus, unspecified: Secondary | ICD-10-CM | POA: Diagnosis present

## 2020-06-30 DIAGNOSIS — K567 Ileus, unspecified: Secondary | ICD-10-CM | POA: Diagnosis present

## 2020-06-30 DIAGNOSIS — Z833 Family history of diabetes mellitus: Secondary | ICD-10-CM | POA: Diagnosis not present

## 2020-06-30 DIAGNOSIS — K921 Melena: Secondary | ICD-10-CM | POA: Diagnosis not present

## 2020-06-30 DIAGNOSIS — R823 Hemoglobinuria: Secondary | ICD-10-CM | POA: Diagnosis present

## 2020-06-30 DIAGNOSIS — Z9049 Acquired absence of other specified parts of digestive tract: Secondary | ICD-10-CM | POA: Diagnosis not present

## 2020-06-30 DIAGNOSIS — K521 Toxic gastroenteritis and colitis: Secondary | ICD-10-CM | POA: Diagnosis present

## 2020-06-30 DIAGNOSIS — D649 Anemia, unspecified: Secondary | ICD-10-CM | POA: Diagnosis present

## 2020-06-30 DIAGNOSIS — Z79899 Other long term (current) drug therapy: Secondary | ICD-10-CM | POA: Diagnosis not present

## 2020-06-30 DIAGNOSIS — Z87891 Personal history of nicotine dependence: Secondary | ICD-10-CM | POA: Diagnosis not present

## 2020-06-30 DIAGNOSIS — Z170 Estrogen receptor positive status [ER+]: Secondary | ICD-10-CM | POA: Diagnosis not present

## 2020-06-30 DIAGNOSIS — D701 Agranulocytosis secondary to cancer chemotherapy: Secondary | ICD-10-CM | POA: Diagnosis present

## 2020-06-30 DIAGNOSIS — Z803 Family history of malignant neoplasm of breast: Secondary | ICD-10-CM | POA: Diagnosis not present

## 2020-06-30 DIAGNOSIS — T451X5A Adverse effect of antineoplastic and immunosuppressive drugs, initial encounter: Secondary | ICD-10-CM | POA: Diagnosis present

## 2020-06-30 LAB — URINALYSIS, ROUTINE W REFLEX MICROSCOPIC
Bacteria, UA: NONE SEEN
Bilirubin Urine: NEGATIVE
Glucose, UA: NEGATIVE mg/dL
Ketones, ur: NEGATIVE mg/dL
Leukocytes,Ua: NEGATIVE
Nitrite: NEGATIVE
Protein, ur: NEGATIVE mg/dL
Specific Gravity, Urine: 1.044 — ABNORMAL HIGH (ref 1.005–1.030)
pH: 6 (ref 5.0–8.0)

## 2020-06-30 LAB — DIFFERENTIAL
Abs Immature Granulocytes: 1.17 10*3/uL — ABNORMAL HIGH (ref 0.00–0.07)
Basophils Absolute: 0.1 10*3/uL (ref 0.0–0.1)
Basophils Relative: 1 %
Eosinophils Absolute: 0 10*3/uL (ref 0.0–0.5)
Eosinophils Relative: 0 %
Immature Granulocytes: 11 %
Lymphocytes Relative: 15 %
Lymphs Abs: 1.6 10*3/uL (ref 0.7–4.0)
Monocytes Absolute: 0.1 10*3/uL (ref 0.1–1.0)
Monocytes Relative: 1 %
Neutro Abs: 7.6 10*3/uL (ref 1.7–7.7)
Neutrophils Relative %: 72 %

## 2020-06-30 LAB — RENAL FUNCTION PANEL
Albumin: 3.4 g/dL — ABNORMAL LOW (ref 3.5–5.0)
Anion gap: 8 (ref 5–15)
BUN: 11 mg/dL (ref 6–20)
CO2: 24 mmol/L (ref 22–32)
Calcium: 8.3 mg/dL — ABNORMAL LOW (ref 8.9–10.3)
Chloride: 105 mmol/L (ref 98–111)
Creatinine, Ser: 0.58 mg/dL (ref 0.44–1.00)
GFR, Estimated: >60 mL/min (ref >60)
Glucose, Bld: 92 mg/dL (ref 70–99)
Phosphorus: 3.3 mg/dL (ref 2.5–4.6)
Potassium: 3.7 mmol/L (ref 3.5–5.1)
Sodium: 137 mmol/L (ref 135–145)

## 2020-06-30 LAB — CBC
HCT: 34.1 % — ABNORMAL LOW (ref 36.0–46.0)
Hemoglobin: 10.9 g/dL — ABNORMAL LOW (ref 12.0–15.0)
MCH: 27 pg (ref 26.0–34.0)
MCHC: 32 g/dL (ref 30.0–36.0)
MCV: 84.4 fL (ref 80.0–100.0)
Platelets: 161 10*3/uL (ref 150–400)
RBC: 4.04 MIL/uL (ref 3.87–5.11)
RDW: 13.9 % (ref 11.5–15.5)
WBC: 6 10*3/uL (ref 4.0–10.5)
nRBC: 0 % (ref 0.0–0.2)

## 2020-06-30 LAB — CBC WITH DIFFERENTIAL/PLATELET
HCT: 38 % (ref 36.0–46.0)
Hemoglobin: 12.1 g/dL (ref 12.0–15.0)
MCH: 26.9 pg (ref 26.0–34.0)
MCHC: 31.8 g/dL (ref 30.0–36.0)
MCV: 84.6 fL (ref 80.0–100.0)
Platelets: 184 10*3/uL (ref 150–400)
RBC: 4.49 MIL/uL (ref 3.87–5.11)
RDW: 14 % (ref 11.5–15.5)
WBC: 10.8 10*3/uL — ABNORMAL HIGH (ref 4.0–10.5)
nRBC: 0 % (ref 0.0–0.2)

## 2020-06-30 LAB — COMPREHENSIVE METABOLIC PANEL
ALT: 21 U/L (ref 0–44)
AST: 23 U/L (ref 15–41)
Albumin: 3.8 g/dL (ref 3.5–5.0)
Alkaline Phosphatase: 98 U/L (ref 38–126)
Anion gap: 9 (ref 5–15)
BUN: 15 mg/dL (ref 6–20)
CO2: 26 mmol/L (ref 22–32)
Calcium: 8.6 mg/dL — ABNORMAL LOW (ref 8.9–10.3)
Chloride: 104 mmol/L (ref 98–111)
Creatinine, Ser: 0.7 mg/dL (ref 0.44–1.00)
GFR, Estimated: >60 mL/min (ref >60)
Glucose, Bld: 106 mg/dL — ABNORMAL HIGH (ref 70–99)
Potassium: 3.4 mmol/L — ABNORMAL LOW (ref 3.5–5.1)
Sodium: 139 mmol/L (ref 135–145)
Total Bilirubin: 0.7 mg/dL (ref 0.3–1.2)
Total Protein: 7.5 g/dL (ref 6.5–8.1)

## 2020-06-30 LAB — C DIFFICILE QUICK SCREEN W PCR REFLEX
C Diff antigen: NEGATIVE
C Diff interpretation: NOT DETECTED
C Diff toxin: NEGATIVE

## 2020-06-30 LAB — MAGNESIUM: Magnesium: 2.4 mg/dL (ref 1.7–2.4)

## 2020-06-30 LAB — SARS CORONAVIRUS 2 (TAT 6-24 HRS): SARS Coronavirus 2: NEGATIVE

## 2020-06-30 LAB — POC OCCULT BLOOD, ED: Fecal Occult Bld: POSITIVE — AB

## 2020-06-30 MED ORDER — LACTATED RINGERS IV BOLUS
1000.0000 mL | Freq: Once | INTRAVENOUS | Status: AC
  Administered 2020-06-30: 1000 mL via INTRAVENOUS

## 2020-06-30 MED ORDER — POTASSIUM CHLORIDE 10 MEQ/100ML IV SOLN
10.0000 meq | INTRAVENOUS | Status: AC
  Administered 2020-06-30 (×4): 10 meq via INTRAVENOUS
  Filled 2020-06-30 (×2): qty 100

## 2020-06-30 MED ORDER — FLEET ENEMA 7-19 GM/118ML RE ENEM: 2.0000 | ENEMA | Freq: Once | RECTAL | Status: DC

## 2020-06-30 MED ORDER — LIDOCAINE-PRILOCAINE 2.5-2.5 % EX CREA
TOPICAL_CREAM | Freq: Once | CUTANEOUS | Status: AC
  Filled 2020-06-30: qty 5

## 2020-06-30 MED ORDER — FAMOTIDINE IN NACL 20-0.9 MG/50ML-% IV SOLN
20.0000 mg | Freq: Two times a day (BID) | INTRAVENOUS | Status: DC
  Administered 2020-06-30: 20 mg via INTRAVENOUS
  Filled 2020-06-30: qty 50

## 2020-06-30 MED ORDER — ACETAMINOPHEN 650 MG RE SUPP: 650.0000 mg | Freq: Four times a day (QID) | RECTAL | Status: DC | PRN

## 2020-06-30 MED ORDER — PROCHLORPERAZINE EDISYLATE 10 MG/2ML IJ SOLN: 5.0000 mg | INTRAMUSCULAR | Status: DC | PRN

## 2020-06-30 MED ORDER — SODIUM CHLORIDE 0.9% FLUSH: 10.0000 mL | INTRAVENOUS | Status: DC | PRN

## 2020-06-30 MED ORDER — SODIUM CHLORIDE 0.9% FLUSH
10.0000 mL | Freq: Two times a day (BID) | INTRAVENOUS | Status: DC
  Administered 2020-06-30 – 2020-07-01 (×3): 10 mL

## 2020-06-30 MED ORDER — LIDOCAINE-PRILOCAINE 2.5-2.5 % EX CREA: 1.0000 "application " | TOPICAL_CREAM | CUTANEOUS | Status: DC | PRN

## 2020-06-30 MED ORDER — LOPERAMIDE HCL 2 MG PO CAPS
2.0000 mg | ORAL_CAPSULE | ORAL | Status: DC | PRN
  Administered 2020-06-30 – 2020-07-02 (×2): 2 mg via ORAL
  Filled 2020-06-30 (×2): qty 1

## 2020-06-30 MED ORDER — IOHEXOL 300 MG/ML  SOLN
100.0000 mL | Freq: Once | INTRAMUSCULAR | Status: AC | PRN
  Administered 2020-06-30: 100 mL via INTRAVENOUS

## 2020-06-30 MED ORDER — HYDROMORPHONE HCL 1 MG/ML IJ SOLN: 0.5000 mg | INTRAMUSCULAR | Status: DC | PRN

## 2020-06-30 MED ORDER — LORAZEPAM 0.5 MG PO TABS
0.5000 mg | ORAL_TABLET | Freq: Four times a day (QID) | ORAL | Status: DC | PRN
  Filled 2020-06-30: qty 1

## 2020-06-30 MED ORDER — ACETAMINOPHEN 325 MG PO TABS
650.0000 mg | ORAL_TABLET | Freq: Four times a day (QID) | ORAL | Status: DC | PRN
  Administered 2020-07-01: 650 mg via ORAL
  Filled 2020-06-30: qty 2

## 2020-06-30 MED ORDER — FLEET ENEMA 7-19 GM/118ML RE ENEM
2.0000 | ENEMA | Freq: Once | RECTAL | Status: AC
  Administered 2020-07-01: 2 via RECTAL
  Filled 2020-06-30: qty 2

## 2020-06-30 MED ORDER — ACETAMINOPHEN 325 MG PO TABS
650.0000 mg | ORAL_TABLET | Freq: Once | ORAL | Status: AC
  Administered 2020-06-30: 650 mg via ORAL
  Filled 2020-06-30: qty 2

## 2020-06-30 MED ORDER — CHLORHEXIDINE GLUCONATE CLOTH 2 % EX PADS
6.0000 | MEDICATED_PAD | Freq: Every day | CUTANEOUS | Status: DC
  Administered 2020-06-30 – 2020-07-02 (×3): 6 via TOPICAL

## 2020-06-30 MED ORDER — FAMOTIDINE IN NACL 20-0.9 MG/50ML-% IV SOLN: 20.0000 mg | Freq: Two times a day (BID) | INTRAVENOUS | Status: DC

## 2020-06-30 NOTE — ED Provider Notes (Signed)
[facility] DEPT Provider Note   CSN: [id number] Arrival date & time: 06/29/20  2343     History No chief complaint on file.   [name] is a 45 y.o. female.  Patient with PMH of breast cancer, Crohn's with colon resection, presents to the emergency department with a chief complaint of rectal bleeding and lower abdominal pain.  She states the symptoms started the day after her most recent chemotherapy.  She states that this feels similar to her prior Crohn's flare ups.   She also reports tingling in her feet.  She denies numbness or weakness.   She reports having completed her chemotherapy a few days ago, was given Neulasta injection on Monday.  Took Tylenol for pain with little relief.  The history is provided by the patient. No language interpreter was used.       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 10/17/2010   Rectal bleeding    Uterine fibroid    Vitamin D deficiency 09/24/2013    Patient Active Problem List   Diagnosis Date Noted   Genetic testing 06/25/2020   Family history of breast cancer 06/11/2020   Malignant neoplasm of upper-outer quadrant of right breast in female, estrogen receptor positive ([place]) 06/04/2020   Abnormal finding present on diagnostic imaging of uterus 05/07/2019   Vitamin D deficiency 09/24/2013   Crohn's disease of small and large intestines with complication ([place]) 53/97/6734   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     Family History  Problem Relation Age of Onset   Breast cancer Maternal Aunt        dx unknown age   Diabetes Father    Diabetes Paternal Aunt    Breast cancer Cousin        maternal cousin; dx unknown age   Esophageal cancer Neg Hx    Rectal cancer Neg Hx    Stomach cancer Neg Hx    Colon polyps Neg Hx    Colon cancer Neg Hx     Social History   Tobacco Use   Smoking status: Former Smoker    Types: Cigarettes   Smokeless tobacco: Never Used  Scientific laboratory technician Use: Never used  Substance Use Topics   Alcohol use: Yes    Alcohol/week: 1.0 - 2.0 standard drink    Types: 1 - 2 Glasses of wine per week   Drug use: No    Home Medications Prior to Admission medications   Medication Sig Start Date End Date Taking? Authorizing Provider  dexamethasone (DECADRON) 4 MG tablet Take 2 tablets (8 mg total) by mouth 2 (two) times daily. Start the day before Taxotere. Then take daily x 3 days after chemotherapy. 06/16/20   Benay Pike, MD  ferrous sulfate 325 (65 FE) MG tablet Take by mouth. 07/02/19  [provider]  lidocaine-prilocaine (EMLA) cream Apply 1 application topically as needed (Prior to port access). 06/16/20   Benay Pike, MD  lidocaine-prilocaine (EMLA) cream Apply to affected area once 06/17/20   Iruku, Arletha Pili, MD  LORazepam (ATIVAN) 0.5 MG tablet Take 1 tablet (0.5 mg total) by mouth every 6 (six) hours as needed (Nausea or vomiting). 06/16/20   Benay Pike, MD  Multiple Vitamin (MULTIVITAMIN) tablet Take 1 tablet by mouth daily.    [provider]  ondansetron (ZOFRAN) 8 MG tablet Take 1 tablet (8 mg total) by mouth 2 (two) times daily as needed (Nausea or vomiting). Start on the third day after chemotherapy. 06/16/20   Benay Pike, MD  pegfilgrastim-bmez (ZIEXTENZO) 6 MG/0.6ML injection  Inject 0.6 mLs (6 mg total) into the skin every 21 ( twenty-one) days. First injection on 06/28/2020 06/22/20   Benay Pike, MD  prochlorperazine (COMPAZINE) 10 MG tablet Take 1 tablet (10 mg total) by mouth every 6 (six) hours as needed (Nausea or vomiting). 06/16/20   Benay Pike, MD    Allergies    Other  Review of Systems   Review of Systems  All other systems reviewed and are negative.   Physical Exam Updated Vital Signs BP (!) 177/97 (BP Location: Left Arm)    Pulse (!) 117    Temp 99.1 F (37.3 C) (Oral)    Resp 18    Ht 5' 3"  (1.6 m)    Wt 68.9 kg    LMP 05/31/2020 (Exact Date)    SpO2 97%    BMI 26.93 kg/m   Physical Exam Vitals and nursing note reviewed.  Constitutional:      General: She is not in acute distress.    Appearance: She is well-developed.  HENT:     Head: Normocephalic and atraumatic.  Eyes:     Conjunctiva/sclera: Conjunctivae normal.  Cardiovascular:     Rate and Rhythm: Normal rate and regular rhythm.     Heart sounds: No murmur heard.   Pulmonary:     Effort: Pulmonary effort is normal. No respiratory distress.     Breath sounds: Normal breath sounds.  Abdominal:     Palpations: Abdomen is soft.     Tenderness: There is no abdominal tenderness.  Musculoskeletal:        General: Normal range of motion.     Cervical back: Neck supple.  Skin:    General: Skin is warm and dry.  Neurological:     Mental Status: She is alert and oriented to person, place, and time.  Psychiatric:        Mood and Affect: Mood normal.        Behavior: Behavior normal.     ED Results / Procedures / Treatments   Labs (all labs ordered are listed, but only abnormal results are displayed) Labs Reviewed  CBC WITH DIFFERENTIAL/PLATELET - Abnormal; Notable for the following components:      Result Value   WBC 10.8 (*)    All other components within normal limits  COMPREHENSIVE METABOLIC PANEL - Abnormal; Notable for the following components:   Potassium 3.4  (*)    Glucose, Bld 106 (*)    Calcium 8.6 (*)    All other components within normal limits  URINALYSIS, ROUTINE W REFLEX MICROSCOPIC  POC OCCULT BLOOD, ED    EKG None  Radiology No results found.  Procedures Procedures   Medications Ordered in ED Medications - No data to display  ED Course  I  have reviewed the triage vital signs and the nursing notes.  Pertinent labs & imaging results that were available during my care of the patient were reviewed by me and considered in my medical decision making (see chart for details).    MDM Rules/Calculators/A&P                          This patient complains of low abdominal pain and rectal bleeding, this involves an extensive number of treatment options, and is a complaint that carries with it a high risk of complications and morbidity.    Differential Dx GI bleed, colitis, diverticulitis  Pertinent Labs I ordered, reviewed, and interpreted labs, which included no significant leukocytosis, electrolytes notable for mild hypokalemia, Hemoccult positive  Imaging Interpretation I ordered imaging studies which included CT abdomen/pelvis which showed findings consistent with colitis versus ileus, but thought to be colitis in this patient with Crohn's disease.  Also showed findings consistent with patient's breast cancer and uterine fibroids, both of which the patient is aware of.   Reassessments After the interventions stated above, I reevaluated the patient and found feeling somewhat improved.  Consultants Dr. Olevia Bowens, hospitalist, who is appreciated for admitting patient for rectal bleeding in the setting of colitis versus ileus.  Plan Admit  Case discussed with Dr. Tyrone Nine, who agrees with plan.    Final Clinical Impression(s) / ED Diagnoses Final diagnoses:  Colitis with rectal bleeding    Rx / DC Orders ED Discharge Orders    None       Montine Circle, PA-C 06/30/20 Bergman, [place], DO 06/30/20 [phone]

## 2020-06-30 NOTE — Consult Note (Addendum)
[facility] Consult: 12:26 PM 06/30/2020  LOS: 0 days    Referring Provider: Dr Kathreen Cosier  Primary Care Physician:  Patient, [name] Primary Gastroenterologist:  Dr. Carlean Purl    Reason for Consultation: Colitis   HPI: [name] is a 45 y.o. female.  PMH IDA.   Ileal and colonic Crohn's disease.  06/2019 robot-assisted subtotal colectomy with ileosigmoid anastomosis.  Has not had to resume any of her Crohn's medications since the surgery. 04/2019 colonoscopy.  Stricture at mid/distal sigmoid colon, suspect this is from Crohn's.  Gastroscope did not pass beyond this area.  Biopsied: Mildly active chronic colitis consistent with Crohn's disease.  No dysplasia, no granulomas 09/2019 Flex sig:  Inflammation at patent end-to-end ileocolonic anastomosis, biopsied.  Normal rectum and sigmoid. Anastomotic pathology shows severely active chronic inflammation with ulceration at anastomotic site.  No specific histopathologic changes on biopsies from rectum or terminal ileum.  Dr. Carlean Purl did not feel that the findings in the intestine as well as pathology supported active Crohn's disease.  Recently diagnosed with breast cancer.  Port-A-Cath placement 06/23/2020.  Began chemotherapy 3/18 under the direction of Dr Chryl Heck w carboplatin, docetaxel, pertuzumab and trastuzumab-anns, premedicated with palonosetron 0.25 mg IVP x1, dexamethasone 10 mg IVPB, fosaprepitant 150 mg IVPB, acetaminophen 650 mg p.o. x1 and diphenhydramine 50 mg p.o. x1 she did well following chemotherapy.  On 3/21 she received an injection of Neulasta. Developed pain in her bones, especially in the feet, after receiving Neulasta.  In addition to the pain in her feet, yesterday she developed diarrhea.  Initially there was a small amount of blood with this  but on subsequent episodes more and more blood was visible and this continues through today.  Some crampy abdominal pain associated with bowel movements but subsides after bowel movements.  No nausea, vomiting.  No fevers or chills.  She is hungry as she has been made n.p.o. Normal bowel pattern is 2-3 soft, sometimes loose stools depending on what she is eaten, daily but no blood.  Hgb 12.1 >> 10.9.  WBCs 10.8 >> 6.  Normal MCV and platelets. Hypokalemia corrected.  No renal dysfunction.  LFTs normal other than albumin slightly low at 3.4. Stool FOBT positive.  Stool C. Difficile Ag and toxin negative. CTAP w contrast: Mild air distention at distal small bowel with some fecalization proximal to the anastomotic line.  Mild mural thickening in the proximal colon remnant.  Faint rectosigmoid mucosal hyper per enhancement, adjacent reactive adenopathy and increased vascularity.  Inspissated fecal contents in the remnant colon.  Findings may reflect mild ileus vs slow intestinal transit in setting of ?active Crohn's.  Fibroid uterus, endometrial thickening.    Past Medical History:  Diagnosis Date  . Colon stricture ([place]) 05/07/2019  . Crohn's disease ([place])   . Diarrhea   . Family history of breast cancer 06/11/2020  . 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 Rectal bleeding   . Uterine fibroid   . Vitamin D deficiency 09/24/2013    Past  Surgical History:  Procedure Laterality Date  . COLECTOMY  06/27/2019  . COLONOSCOPY  2015  . PORTACATH PLACEMENT N/A 06/23/2020   Procedure: INSERTION PORT-A-CATH;  Surgeon: [name], MD;  Location: [facility];  Service: General;  Laterality: N/A;  START TIME OF 11:00 AM FOR 66 MINUTES [place] [facility]    Prior to Admission medications   Medication Sig Start Date End Date Taking? Authorizing Provider  dexamethasone (DECADRON) 4 MG tablet Take 2 tablets (8 mg total) by mouth 2 (two) times daily.  Start the day before Taxotere. Then take daily x 3 days after chemotherapy. 06/16/20  Yes Benay Pike, MD  ferrous sulfate 325 (65 FE) MG tablet Take 325 mg by mouth daily with breakfast. 07/02/19  Yes [provider]  lidocaine-prilocaine (EMLA) cream Apply 1 application topically as needed (Prior to port access). 06/16/20  Yes Benay Pike, MD  loratadine (CLARITIN) 10 MG tablet Take 10 mg by mouth See admin instructions. Take 7 days starting the day of injection   Yes [provider]  LORazepam (ATIVAN) 0.5 MG tablet Take 1 tablet (0.5 mg total) by mouth every 6 (six) hours as needed (Nausea or vomiting). 06/16/20  Yes Benay Pike, MD  Multiple Vitamin (MULTIVITAMIN) tablet Take 1 tablet by mouth daily.   Yes [provider]  ondansetron (ZOFRAN) 8 MG tablet Take 1 tablet (8 mg total) by mouth 2 (two) times daily as needed (Nausea or vomiting). Start on the third day after chemotherapy. 06/16/20  Yes Benay Pike, MD  pegfilgrastim-bmez (ZIEXTENZO) 6 MG/0.6ML injection Inject 0.6 mLs (6 mg total) into the skin every 21 ( twenty-one) days. First injection on 06/28/2020 06/22/20  Yes Iruku, Arletha Pili, MD  prochlorperazine (COMPAZINE) 10 MG tablet Take 1 tablet (10 mg total) by mouth every 6 (six) hours as needed (Nausea or vomiting). 06/16/20  Yes Benay Pike, MD    Scheduled Meds:  Infusions: . famotidine (PEPCID) IV 20 mg (06/30/20 0853)   PRN Meds: acetaminophen **OR** acetaminophen, HYDROmorphone (DILAUDID) injection, lidocaine-prilocaine, prochlorperazine   Allergies as of 06/29/2020 - Review Complete 06/29/2020  Allergen Reaction Noted  . Other  06/23/2019    Family History  Problem Relation Age of Onset  . Breast cancer Maternal Aunt        dx unknown age  . Diabetes Father   . Diabetes Paternal Aunt   . Breast cancer Cousin        maternal cousin; dx unknown age  . Esophageal cancer Neg Hx   . Rectal cancer Neg Hx   . Stomach cancer Neg Hx    . Colon polyps Neg Hx   . Colon cancer Neg Hx     Social History   Socioeconomic History  . Marital status: Single    Spouse name: Not on file  . Number of children: 1  . Years of education: 85  . Highest education level: Not on file  Occupational History  . Occupation: Medical Coder   Tobacco Use  . Smoking status: Former Smoker    Types: Cigarettes  . Smokeless tobacco: Never Used  Vaping Use  . Vaping Use: Never used  Substance and Sexual Activity  . Alcohol use: Yes    Alcohol/week: 1.0 - 2.0 standard drink    Types: 1 - 2 Glasses of wine per week  . Drug use: No  . Sexual activity: Yes    Partners: Male    Comment: Intermittent protection not consistent  Other Topics Concern  . Not  on file  Social History Narrative   Single, 1 daughter born 2001 approximately   Careers information officer for [employer]   She is a former smoker, occasional alcohol no drug use   Social Determinants of Radio broadcast assistant Strain: Not on file  Food Insecurity: Not on file  Transportation Needs: Not on file  Physical Activity: Not on file  Stress: Not on file  Social Connections: Not on file  Intimate Partner Violence: Not on file    REVIEW OF SYSTEMS: Constitutional: No profound weakness or fatigue. ENT:  No nose bleeds Pulm: No shortness of breath or cough. CV:  No palpitations, no LE edema.  No angina GU:  No hematuria, no frequency GI: See HPI peer Heme: Other than the intestinal bleeding, no excessive or unusual bleeding or bruising Transfusions: No records of RBC transfusions.  Patient declines blood product transfusion now and in the past though she is not a Jehovah's Witness. Neuro:  No headaches, no peripheral tingling or numbness Derm:  No itching, no rash or sores.  Endocrine:  No sweats or chills.  No polyuria or dysuria Immunization: Not queried Travel:  None beyond local counties in last few months.    PHYSICAL EXAM: Vital signs in last 24 hours: Vitals:    06/30/20 0649 06/30/20 1030  BP: (!) 116/91 125/89  Pulse: 87 99  Resp: 14 16  Temp: 98.2 F (36.8 C) 99 F (37.2 C)  SpO2: 99% 100%   Wt Readings from Last 3 Encounters:  06/29/20 68.9 kg  06/25/20 68.9 kg  06/24/20 70.1 kg    General: Does not, looks well.  Resting comfortably. Head: No facial asymmetry or swelling.  No signs of head trauma. Eyes: No conjunctival pallor or scleral icterus.  EOMI Ears: No hearing deficit Nose: Congestion or discharge Mouth: Oral mucosa is moist, pink, clear.  Good dentition.  Tongue midline. Neck: No JVD, no masses, no thyromegaly Lungs: Slight breath sounds.  Clear bilaterally.  No labored breathing or cough. Heart: RRR.  No MRG.  S1, S2 present. Abdomen: Soft, slight mild tenderness on the right mid abdomen without guarding or rebound.  No distention.  No HSM, masses, bruits, hernias.  Bowel sounds active..   Rectal: Did not perform digital exam.  No visible blood or hemorrhoids. Musc/Skeltl: No joint redness, swelling or gross deformity. Extremities: No CCE. Neurologic: Alert.  Oriented x3.  Moves all 4 limbs without weakness, no tremor. Skin: No rash, no sores, no telangiectasia.  No suspicious lesions.  Psych: Calm, pleasant, cooperative.  Intake/Output from previous day: No intake/output data recorded. Intake/Output this shift: Total I/O In: 350 [IV Piggyback:350] Out: -   LAB RESULTS: Recent Labs    06/29/20 2352 06/30/20 0727  WBC 10.8* 6.0  HGB 12.1 10.9*  HCT 38.0 34.1*  PLT 184 161   BMET Lab Results  Component Value Date   NA 137 06/30/2020   NA 139 06/29/2020   NA 141 06/24/2020   K 3.7 06/30/2020   K 3.4 (L) 06/29/2020   K 4.2 06/24/2020   CL 105 06/30/2020   CL 104 06/29/2020   CL 106 06/24/2020   CO2 24 06/30/2020   CO2 26 06/29/2020   CO2 25 06/24/2020   GLUCOSE 92 06/30/2020   GLUCOSE 106 (H) 06/29/2020   GLUCOSE 99 06/24/2020   BUN 11 06/30/2020   BUN 15 06/29/2020   BUN 6 06/24/2020    CREATININE 0.58 06/30/2020   CREATININE 0.70 06/29/2020   CREATININE 0.86  06/24/2020   CALCIUM 8.3 (L) 06/30/2020   CALCIUM 8.6 (L) 06/29/2020   CALCIUM 9.4 06/24/2020   LFT Recent Labs    06/29/20 2352 06/30/20 0727  PROT 7.5  --   ALBUMIN 3.8 3.4*  AST 23  --   ALT 21  --   ALKPHOS 98  --   BILITOT 0.7  --    PT/INR No results found for: INR, PROTIME Hepatitis Panel No results for input(s): HEPBSAG, HCVAB, HEPAIGM, HEPBIGM in the last 72 hours. C-Diff No components found for: CDIFF Lipase     Component Value Date/Time   LIPASE 20 04/02/2019 0929    Drugs of Abuse  No results found for: LABOPIA, COCAINSCRNUR, LABBENZ, AMPHETMU, THCU, LABBARB   RADIOLOGY STUDIES: CT ABDOMEN PELVIS W CONTRAST  Result Date: 06/30/2020 CLINICAL DATA:  Abdominal pain, hematochezia, recently began treatment for right breast cancer 06/25/2020, initially diagnosed February 2022 EXAM: CT ABDOMEN AND PELVIS WITH CONTRAST TECHNIQUE: Multidetector CT imaging of the abdomen and pelvis was performed using the standard protocol following bolus administration of intravenous contrast. CONTRAST:  180m OMNIPAQUE IOHEXOL 300 MG/ML  SOLN COMPARISON:  CT 04/02/2019 FINDINGS: Lower chest: Mild pectus deformity. Lung bases are clear. Normal heart size. No pericardial effusion. Irregular thickening of the tissues in the right breast lower outer quadrant likely corresponding to the abnormality seen on comparison breast MR. Hepatobiliary: No worrisome focal liver lesions. Smooth liver surface contour. Normal hepatic attenuation. Normal gallbladder and biliary tree. Pancreas: No pancreatic ductal dilatation or surrounding inflammatory changes. Spleen: Normal in size. No concerning splenic lesions. Adrenals/Urinary Tract: Normal adrenal glands. Kidneys are normally located with symmetric enhancement and excretion. No suspicious renal lesion, urolithiasis or hydronephrosis. Urinary bladder is unremarkable. Stomach/Bowel:  Distal esophagus, stomach and duodenum are unremarkable. Postsurgical changes from prior partial colectomy with ileocolic anastomosis seen in the midline abdomen. Mild air distention is seen of the distal most segments of small bowel with some fecalization just proximal to the anastomotic line as well as slightly inspissated fecal contents in the colonic remnant. Some mild mural thickening is noted of the proximal colonic remnant as well as the rectosigmoid with faint mucosal hyperenhancement. Some adjacent reactive adenopathy and increased vascularity is present as well. Vascular/Lymphatic: No significant vascular findings are present. No enlarged abdominal or pelvic lymph nodes. Reproductive: Enlarged, heterogeneous fibroid uterus. Multiple intramural, subserosal and submucosal fibroids throughout the uterus result in distortion of the endometrium though some questionable endometrial thickening may be present up to 25 mm which is greater than expected, similar to comparison CT imaging. No concerning adnexal lesions. Other: No abdominopelvic free fluid or free gas. No bowel containing hernias. Mild rectus diastasis. Postsurgical changes from prior vertical midline incision. Musculoskeletal: No acute osseous abnormality or suspicious osseous lesion. Musculature is normal and symmetric. IMPRESSION: 1. Postsurgical changes from prior subtotal colectomy with ileocolic anastomosis seen in the midline abdomen. Mild air distention of the distal most segments of small bowel with some fecalization just proximal to and across the patent anastomotic line. Inspissated fecal contents in the colonic remnant. Findings could reflect a mild ileus or slowed intestinal transit possibly a secondary finding to the additional inflammatory features of the colonic remnant suspicious for a colitis, favor inflammatory in this patient with history of Crohn's. 2. Enlarged, heterogeneous fibroid uterus. Multiple intramural, subserosal and  submucosal fibroids throughout the uterus result in distortion of the endometrium though some questionable endometrial thickening may be present up to 25 mm which is greater than expected, similar to  comparison CT imaging. Recommend further evaluation with pelvic ultrasound as clinically indicated. 3. Irregular thickening of the tissues in the right breast lower outer quadrant likely corresponding to the abnormality seen on comparison breast Recommend correlation with recent mammography. Electronically Signed   By: Lovena Le M.D.   On: 06/30/2020 03:08     IMPRESSION:   *    Bloody diarrhea in pt who recently had first session of chemotherapy for breast cancer on 3/18 followed by Neulasta on 3/21.   Patient with history small bowel, colonic Crohn's disease.  Crohn's associated stricture treated with subtotal colectomy and ileocolonic anastomosis 06/2019. Follow-up flexible sigmoidoscopy 09/2019 showed chronic inflammation at the anastomosis which Dr. Carlean Purl was not convinced represented active Crohn's disease. Has been on no Crohn's meds since the surgery. CTAP raises concern for active Crohn's in the remnant proximal colon and or rectosigmoid.  *    normocytic anemia.  Takes oral iron at home, labs consistent with iron deficiency in October 2021 which had normalized by repeat testing 3 weeks ago and 1 week ago.  PLAN:     *  Okay for clear liquids, ordered.  In fact if she tolerates these, could perhaps advance to a soft diet. Dr Havery Moros will see pt later today.    *   Note that the hospitalist ordered Pepcid 20 mg IV bid.  From a GI standpoint this is not necessary but it was possibly ordered for non-GI reasons so will leave this in place.   [name]  06/30/2020, 12:26 PM Phone [phone]

## 2020-06-30 NOTE — Plan of Care (Signed)
  Problem: Pain Managment: Goal: General experience of comfort will improve Description: Patient's general experience of comfort will improve by 07/03/20 Outcome: Progressing

## 2020-06-30 NOTE — Telephone Encounter (Signed)
Provider- -asked if Dr Julien Nordmann is taking new pts. I  told her that he does not see new Breast Cancer pts.   I told her to talk to Dr Chryl Heck at her next appt.

## 2020-06-30 NOTE — H&P (View-Only) (Signed)
[facility] Consult: 12:26 PM 06/30/2020  LOS: 0 days    Referring Provider: Dr Kathreen Cosier  Primary Care Physician:  Patient, [name] Primary Gastroenterologist:  Dr. Carlean Purl    Reason for Consultation: Colitis   HPI: [name] is a 45 y.o. female.  PMH IDA.   Ileal and colonic Crohn's disease.  06/2019 robot-assisted subtotal colectomy with ileosigmoid anastomosis.  Has not had to resume any of her Crohn's medications since the surgery. 04/2019 colonoscopy.  Stricture at mid/distal sigmoid colon, suspect this is from Crohn's.  Gastroscope did not pass beyond this area.  Biopsied: Mildly active chronic colitis consistent with Crohn's disease.  No dysplasia, no granulomas 09/2019 Flex sig:  Inflammation at patent end-to-end ileocolonic anastomosis, biopsied.  Normal rectum and sigmoid. Anastomotic pathology shows severely active chronic inflammation with ulceration at anastomotic site.  No specific histopathologic changes on biopsies from rectum or terminal ileum.  Dr. Carlean Purl did not feel that the findings in the intestine as well as pathology supported active Crohn's disease.  Recently diagnosed with breast cancer.  Port-A-Cath placement 06/23/2020.  Began chemotherapy 3/18 under the direction of Dr Chryl Heck w carboplatin, docetaxel, pertuzumab and trastuzumab-anns, premedicated with palonosetron 0.25 mg IVP x1, dexamethasone 10 mg IVPB, fosaprepitant 150 mg IVPB, acetaminophen 650 mg p.o. x1 and diphenhydramine 50 mg p.o. x1 she did well following chemotherapy.  On 3/21 she received an injection of Neulasta. Developed pain in her bones, especially in the feet, after receiving Neulasta.  In addition to the pain in her feet, yesterday she developed diarrhea.  Initially there was a small amount of blood with this  but on subsequent episodes more and more blood was visible and this continues through today.  Some crampy abdominal pain associated with bowel movements but subsides after bowel movements.  No nausea, vomiting.  No fevers or chills.  She is hungry as she has been made n.p.o. Normal bowel pattern is 2-3 soft, sometimes loose stools depending on what she is eaten, daily but no blood.  Hgb 12.1 >> 10.9.  WBCs 10.8 >> 6.  Normal MCV and platelets. Hypokalemia corrected.  No renal dysfunction.  LFTs normal other than albumin slightly low at 3.4. Stool FOBT positive.  Stool C. Difficile Ag and toxin negative. CTAP w contrast: Mild air distention at distal small bowel with some fecalization proximal to the anastomotic line.  Mild mural thickening in the proximal colon remnant.  Faint rectosigmoid mucosal hyper per enhancement, adjacent reactive adenopathy and increased vascularity.  Inspissated fecal contents in the remnant colon.  Findings may reflect mild ileus vs slow intestinal transit in setting of ?active Crohn's.  Fibroid uterus, endometrial thickening.    Past Medical History:  Diagnosis Date  . Colon stricture ([place]) 05/07/2019  . Crohn's disease ([place])   . Diarrhea   . Family history of breast cancer 06/11/2020  . 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 Rectal bleeding   . Uterine fibroid   . Vitamin D deficiency 09/24/2013    Past  Surgical History:  Procedure Laterality Date  . COLECTOMY  06/27/2019  . COLONOSCOPY  2015  . PORTACATH PLACEMENT N/A 06/23/2020   Procedure: INSERTION PORT-A-CATH;  Surgeon: [name], MD;  Location: [facility];  Service: General;  Laterality: N/A;  START TIME OF 11:00 AM FOR 41 MINUTES [place]    Prior to Admission medications   Medication Sig Start Date End Date Taking? Authorizing Provider  dexamethasone (DECADRON) 4 MG tablet Take 2 tablets (8 mg total) by mouth 2 (two) times daily.  Start the day before Taxotere. Then take daily x 3 days after chemotherapy. 06/16/20  Yes Benay Pike, MD  ferrous sulfate 325 (65 FE) MG tablet Take 325 mg by mouth daily with breakfast. 07/02/19  Yes [provider]  lidocaine-prilocaine (EMLA) cream Apply 1 application topically as needed (Prior to port access). 06/16/20  Yes Benay Pike, MD  loratadine (CLARITIN) 10 MG tablet Take 10 mg by mouth See admin instructions. Take 7 days starting the day of injection   Yes [provider]  LORazepam (ATIVAN) 0.5 MG tablet Take 1 tablet (0.5 mg total) by mouth every 6 (six) hours as needed (Nausea or vomiting). 06/16/20  Yes Benay Pike, MD  Multiple Vitamin (MULTIVITAMIN) tablet Take 1 tablet by mouth daily.   Yes [provider]  ondansetron (ZOFRAN) 8 MG tablet Take 1 tablet (8 mg total) by mouth 2 (two) times daily as needed (Nausea or vomiting). Start on the third day after chemotherapy. 06/16/20  Yes Benay Pike, MD  pegfilgrastim-bmez (ZIEXTENZO) 6 MG/0.6ML injection Inject 0.6 mLs (6 mg total) into the skin every 21 ( twenty-one) days. First injection on 06/28/2020 06/22/20  Yes Iruku, Arletha Pili, MD  prochlorperazine (COMPAZINE) 10 MG tablet Take 1 tablet (10 mg total) by mouth every 6 (six) hours as needed (Nausea or vomiting). 06/16/20  Yes Benay Pike, MD    Scheduled Meds:  Infusions: . famotidine (PEPCID) IV 20 mg (06/30/20 0853)   PRN Meds: acetaminophen **OR** acetaminophen, HYDROmorphone (DILAUDID) injection, lidocaine-prilocaine, prochlorperazine   Allergies as of 06/29/2020 - Review Complete 06/29/2020  Allergen Reaction Noted  . Other  06/23/2019    Family History  Problem Relation Age of Onset  . Breast cancer Maternal Aunt        dx unknown age  . Diabetes Father   . Diabetes Paternal Aunt   . Breast cancer Cousin        maternal cousin; dx unknown age  . Esophageal cancer Neg Hx   . Rectal cancer Neg Hx   . Stomach cancer Neg Hx    . Colon polyps Neg Hx   . Colon cancer Neg Hx     Social History   Socioeconomic History  . Marital status: Single    Spouse name: Not on file  . Number of children: 1  . Years of education: 81  . Highest education level: Not on file  Occupational History  . Occupation: Medical Coder   Tobacco Use  . Smoking status: Former Smoker    Types: Cigarettes  . Smokeless tobacco: Never Used  Vaping Use  . Vaping Use: Never used  Substance and Sexual Activity  . Alcohol use: Yes    Alcohol/week: 1.0 - 2.0 standard drink    Types: 1 - 2 Glasses of wine per week  . Drug use: No  . Sexual activity: Yes    Partners: Male    Comment: Intermittent protection not consistent  Other Topics Concern  . Not  on file  Social History Narrative   Single, 1 daughter born 2001 approximately   Careers information officer for [employer]   She is a former smoker, occasional alcohol no drug use   Social Determinants of Radio broadcast assistant Strain: Not on file  Food Insecurity: Not on file  Transportation Needs: Not on file  Physical Activity: Not on file  Stress: Not on file  Social Connections: Not on file  Intimate Partner Violence: Not on file    REVIEW OF SYSTEMS: Constitutional: No profound weakness or fatigue. ENT:  No nose bleeds Pulm: No shortness of breath or cough. CV:  No palpitations, no LE edema.  No angina GU:  No hematuria, no frequency GI: See HPI peer Heme: Other than the intestinal bleeding, no excessive or unusual bleeding or bruising Transfusions: No records of RBC transfusions.  Patient declines blood product transfusion now and in the past though she is not a Jehovah's Witness. Neuro:  No headaches, no peripheral tingling or numbness Derm:  No itching, no rash or sores.  Endocrine:  No sweats or chills.  No polyuria or dysuria Immunization: Not queried Travel:  None beyond local counties in last few months.    PHYSICAL EXAM: Vital signs in last 24 hours: Vitals:    06/30/20 0649 06/30/20 1030  BP: (!) 116/91 125/89  Pulse: 87 99  Resp: 14 16  Temp: 98.2 F (36.8 C) 99 F (37.2 C)  SpO2: 99% 100%   Wt Readings from Last 3 Encounters:  06/29/20 68.9 kg  06/25/20 68.9 kg  06/24/20 70.1 kg    General: Does not, looks well.  Resting comfortably. Head: No facial asymmetry or swelling.  No signs of head trauma. Eyes: No conjunctival pallor or scleral icterus.  EOMI Ears: No hearing deficit Nose: Congestion or discharge Mouth: Oral mucosa is moist, pink, clear.  Good dentition.  Tongue midline. Neck: No JVD, no masses, no thyromegaly Lungs: Slight breath sounds.  Clear bilaterally.  No labored breathing or cough. Heart: RRR.  No MRG.  S1, S2 present. Abdomen: Soft, slight mild tenderness on the right mid abdomen without guarding or rebound.  No distention.  No HSM, masses, bruits, hernias.  Bowel sounds active..   Rectal: Did not perform digital exam.  No visible blood or hemorrhoids. Musc/Skeltl: No joint redness, swelling or gross deformity. Extremities: No CCE. Neurologic: Alert.  Oriented x3.  Moves all 4 limbs without weakness, no tremor. Skin: No rash, no sores, no telangiectasia.  No suspicious lesions.  Psych: Calm, pleasant, cooperative.  Intake/Output from previous day: No intake/output data recorded. Intake/Output this shift: Total I/O In: 350 [IV Piggyback:350] Out: -   LAB RESULTS: Recent Labs    06/29/20 2352 06/30/20 0727  WBC 10.8* 6.0  HGB 12.1 10.9*  HCT 38.0 34.1*  PLT 184 161   BMET Lab Results  Component Value Date   NA 137 06/30/2020   NA 139 06/29/2020   NA 141 06/24/2020   K 3.7 06/30/2020   K 3.4 (L) 06/29/2020   K 4.2 06/24/2020   CL 105 06/30/2020   CL 104 06/29/2020   CL 106 06/24/2020   CO2 24 06/30/2020   CO2 26 06/29/2020   CO2 25 06/24/2020   GLUCOSE 92 06/30/2020   GLUCOSE 106 (H) 06/29/2020   GLUCOSE 99 06/24/2020   BUN 11 06/30/2020   BUN 15 06/29/2020   BUN 6 06/24/2020    CREATININE 0.58 06/30/2020   CREATININE 0.70 06/29/2020   CREATININE 0.86  06/24/2020   CALCIUM 8.3 (L) 06/30/2020   CALCIUM 8.6 (L) 06/29/2020   CALCIUM 9.4 06/24/2020   LFT Recent Labs    06/29/20 2352 06/30/20 0727  PROT 7.5  --   ALBUMIN 3.8 3.4*  AST 23  --   ALT 21  --   ALKPHOS 98  --   BILITOT 0.7  --    PT/INR No results found for: INR, PROTIME Hepatitis Panel No results for input(s): HEPBSAG, HCVAB, HEPAIGM, HEPBIGM in the last 72 hours. C-Diff No components found for: CDIFF Lipase     Component Value Date/Time   LIPASE 20 04/02/2019 0929    Drugs of Abuse  No results found for: LABOPIA, COCAINSCRNUR, LABBENZ, AMPHETMU, THCU, LABBARB   RADIOLOGY STUDIES: CT ABDOMEN PELVIS W CONTRAST  Result Date: 06/30/2020 CLINICAL DATA:  Abdominal pain, hematochezia, recently began treatment for right breast cancer 06/25/2020, initially diagnosed February 2022 EXAM: CT ABDOMEN AND PELVIS WITH CONTRAST TECHNIQUE: Multidetector CT imaging of the abdomen and pelvis was performed using the standard protocol following bolus administration of intravenous contrast. CONTRAST:  172m OMNIPAQUE IOHEXOL 300 MG/ML  SOLN COMPARISON:  CT 04/02/2019 FINDINGS: Lower chest: Mild pectus deformity. Lung bases are clear. Normal heart size. No pericardial effusion. Irregular thickening of the tissues in the right breast lower outer quadrant likely corresponding to the abnormality seen on comparison breast MR. Hepatobiliary: No worrisome focal liver lesions. Smooth liver surface contour. Normal hepatic attenuation. Normal gallbladder and biliary tree. Pancreas: No pancreatic ductal dilatation or surrounding inflammatory changes. Spleen: Normal in size. No concerning splenic lesions. Adrenals/Urinary Tract: Normal adrenal glands. Kidneys are normally located with symmetric enhancement and excretion. No suspicious renal lesion, urolithiasis or hydronephrosis. Urinary bladder is unremarkable. Stomach/Bowel:  Distal esophagus, stomach and duodenum are unremarkable. Postsurgical changes from prior partial colectomy with ileocolic anastomosis seen in the midline abdomen. Mild air distention is seen of the distal most segments of small bowel with some fecalization just proximal to the anastomotic line as well as slightly inspissated fecal contents in the colonic remnant. Some mild mural thickening is noted of the proximal colonic remnant as well as the rectosigmoid with faint mucosal hyperenhancement. Some adjacent reactive adenopathy and increased vascularity is present as well. Vascular/Lymphatic: No significant vascular findings are present. No enlarged abdominal or pelvic lymph nodes. Reproductive: Enlarged, heterogeneous fibroid uterus. Multiple intramural, subserosal and submucosal fibroids throughout the uterus result in distortion of the endometrium though some questionable endometrial thickening may be present up to 25 mm which is greater than expected, similar to comparison CT imaging. No concerning adnexal lesions. Other: No abdominopelvic free fluid or free gas. No bowel containing hernias. Mild rectus diastasis. Postsurgical changes from prior vertical midline incision. Musculoskeletal: No acute osseous abnormality or suspicious osseous lesion. Musculature is normal and symmetric. IMPRESSION: 1. Postsurgical changes from prior subtotal colectomy with ileocolic anastomosis seen in the midline abdomen. Mild air distention of the distal most segments of small bowel with some fecalization just proximal to and across the patent anastomotic line. Inspissated fecal contents in the colonic remnant. Findings could reflect a mild ileus or slowed intestinal transit possibly a secondary finding to the additional inflammatory features of the colonic remnant suspicious for a colitis, favor inflammatory in this patient with history of Crohn's. 2. Enlarged, heterogeneous fibroid uterus. Multiple intramural, subserosal and  submucosal fibroids throughout the uterus result in distortion of the endometrium though some questionable endometrial thickening may be present up to 25 mm which is greater than expected, similar to  comparison CT imaging. Recommend further evaluation with pelvic ultrasound as clinically indicated. 3. Irregular thickening of the tissues in the right breast lower outer quadrant likely corresponding to the abnormality seen on comparison breast Recommend correlation with recent mammography. Electronically Signed   By: Lovena Le M.D.   On: 06/30/2020 03:08     IMPRESSION:   *    Bloody diarrhea in pt who recently had first session of chemotherapy for breast cancer on 3/18 followed by Neulasta on 3/21.   Patient with history small bowel, colonic Crohn's disease.  Crohn's associated stricture treated with subtotal colectomy and ileocolonic anastomosis 06/2019. Follow-up flexible sigmoidoscopy 09/2019 showed chronic inflammation at the anastomosis which Dr. Carlean Purl was not convinced represented active Crohn's disease. Has been on no Crohn's meds since the surgery. CTAP raises concern for active Crohn's in the remnant proximal colon and or rectosigmoid.  *    normocytic anemia.  Takes oral iron at home, labs consistent with iron deficiency in October 2021 which had normalized by repeat testing 3 weeks ago and 1 week ago.  PLAN:     *  Okay for clear liquids, ordered.  In fact if she tolerates these, could perhaps advance to a soft diet. Dr Havery Moros will see pt later today.    *   Note that the hospitalist ordered Pepcid 20 mg IV bid.  From a GI standpoint this is not necessary but it was possibly ordered for non-GI reasons so will leave this in place.   [name]  06/30/2020, 12:26 PM Phone [phone]

## 2020-06-30 NOTE — ED Notes (Signed)
Patient transported to CT 

## 2020-06-30 NOTE — Consult Note (Signed)
[facility] CONSULT NOTE  Patient Care Team: Patient, [name] as PCP - General (General Practice) [name], RN as Oncology Nurse Navigator [name], RN as Oncology Nurse Navigator  CHIEF COMPLAINTS/PURPOSE OF CONSULTATION:  Bloody stool  ASSESSMENT & PLAN:   No problem-specific Assessment & Plan notes found for this encounter.  This is a pleasant 45 yr old female on neoadjuvant chemotherapy with carboplatin, docetaxel, herceptin and perjeta for locally advanced breast cancer, PMH of crohn's disease admitted with blood in stool. She is not neutropenic at this time and tested negative for C diff. Please consider PRN imodium for diarrhea if ok with gastroenterology team, fluid and electrolyte correction and gastro enterology consult for management of crohn's flare. She has no signs of acute abdomen at this time. It is possible that the diarrhea could have precipitated with chemo, we have discussed multiple times with patient about adverse effects I have reassured her that we will decrease the dose for C2. She appears to have some response to the treatment already, she is happy about it. CBC reviewed, mild drop in Hb, please consider transfusion if Hb 8 gm/dl or if there is a sudden drop by more than 2 gm.dl We will continue to monitor her inpatient.   HISTORY OF PRESENTING ILLNESS:   [name] 45 y.o. female is here because of bloody stool  Patient recently started on chemotherapy for breast cancer. She received chemo on 06/25/2020 She called [place] yesterday with some concern for bleeding per rectum, but she said it was faint. She then had a bloody bowel movement after her dinner and came to the ER because she was concerned for crohn's flare. She had about 5-7 bowel movements yesterday, she says today is better, no fevers, chills, abdominal pain. She otherwise feels that the breast lump is getting smaller. Rest of the pertinent 10 point ROS reviewed  and unremarkable.  Oncology History  06/02/2020, Mammo and [place] Numerous concerning hypoechoic masses in the lateral aspect of the right breast. Enlarged right axillary lymph node.  06/03/2020, US guided biopsy of the right breast 9 0 clock mass showed grade III IDC Pathology from right breast 10 0 clock mass showed grade III IDC  Prognostics showed ER 40% positive, weak staining, PR positive 0 %, Her 2 3 +, Ki 67 30% MRI breast showed contiguous masses in the UPPER-OUTER and LOWER-OUTER quadrants of the RIGHT breast, spanning 7.9 centimeters in greatest dimension. 2. Enhancement extends to the RIGHT nipple, suspicious for nipple and/or retroareolar involvement. 3. Significant skin thickening and skin enhancement of the RIGHT breast. 4. Enlarged RIGHT axillary lymph node with tissue marker clip, consistent with known metastatic disease. 5. Single indeterminate 6 millimeter mass in the LOWER OUTER QUADRANT of the LEFT breast warranting tissue diagnosis. Given the small size, ultrasound correlate is unlikely, and MR guided biopsy is recommended.  Left breast biopsy showed benign breast tissue with focal fibroadenomatous change, no evidence of malignancy. CT chest abdomen and pelvis with inflammatory right breast cancer with single enlarged right axillary node. Post surgical changes of left breast. NO convincing evidence of metastatic disease. Endometrial thickening and enhancement out of proportion for a premenopausal patient. Findings could reflect an intracavitary lesion or endometrial hyperplasia/neoplasia. Correlate with any symptoms of bleeding and medication use (such as tamoxifen)  She started neoadjuvant chemotherapy [facility] on 06/25/2020,  MEDICAL HISTORY:  Past Medical History:  Diagnosis Date  . Colon stricture ([place]) 05/07/2019  . Crohn's disease ([place])   .  Diarrhea   . Family history of breast cancer 06/11/2020  . Iron deficiency anemia secondary to blood loss (chronic) -  Crohn's colitis 10/17/2010  . Rectal bleeding   . Uterine fibroid   . Vitamin D deficiency 09/24/2013    SURGICAL HISTORY: Past Surgical History:  Procedure Laterality Date  . COLECTOMY  06/27/2019  . COLONOSCOPY  2015  . PORTACATH PLACEMENT N/A 06/23/2020   Procedure: INSERTION PORT-A-CATH;  Surgeon: [name], MD;  Location: [facility];  Service: General;  Laterality: N/A;  START TIME OF 11:00 AM FOR 60 MINUTES [place]    SOCIAL HISTORY: Social History   Socioeconomic History  . Marital status: Single    Spouse name: Not on file  . Number of children: 1  . Years of education: 43  . Highest education level: Not on file  Occupational History  . Occupation: Medical Coder   Tobacco Use  . Smoking status: Former Smoker    Types: Cigarettes  . Smokeless tobacco: Never Used  Vaping Use  . Vaping Use: Never used  Substance and Sexual Activity  . Alcohol use: Yes    Alcohol/week: 1.0 - 2.0 standard drink    Types: 1 - 2 Glasses of wine per week  . Drug use: No  . Sexual activity: Yes    Partners: Male    Comment: Intermittent protection not consistent  Other Topics Concern  . Not on file  Social History Narrative   Single, 1 daughter born 2001 approximately   Medical coder for [employer]   She is a former smoker, occasional alcohol no drug use   Social Determinants of Radio broadcast assistant Strain: Not on file  Food Insecurity: Not on file  Transportation Needs: Not on file  Physical Activity: Not on file  Stress: Not on file  Social Connections: Not on file  Intimate Partner Violence: Not on file    FAMILY HISTORY: Family History  Problem Relation Age of Onset  . Breast cancer Maternal Aunt        dx unknown age  . Diabetes Father   . Diabetes Paternal Aunt   . Breast cancer Cousin        maternal cousin; dx unknown age  . Esophageal cancer Neg Hx   . Rectal cancer Neg Hx   . Stomach cancer Neg Hx   . Colon polyps Neg Hx   .  Colon cancer Neg Hx     ALLERGIES:  is allergic to other.  MEDICATIONS:  Current Facility-Administered Medications  Medication Dose Route Frequency Provider Last Rate Last Admin  . acetaminophen (TYLENOL) tablet 650 mg  650 mg Oral Q6H PRN Reubin Milan, MD       Or  . acetaminophen (TYLENOL) suppository 650 mg  650 mg Rectal Q6H PRN Reubin Milan, MD      . famotidine (PEPCID) IVPB 20 mg premix  20 mg Intravenous Q12H Reubin Milan, MD 100 mL/hr at 06/30/20 0853 20 mg at 06/30/20 0853  . HYDROmorphone (DILAUDID) injection 0.5 mg  0.5 mg Intravenous Q4H PRN Reubin Milan, MD      . lidocaine-prilocaine (EMLA) cream 1 application  1 application Topical PRN Reubin Milan, MD      . prochlorperazine (COMPAZINE) injection 5 mg  5 mg Intravenous Q4H PRN Reubin Milan, MD         PHYSICAL EXAMINATION: ECOG PERFORMANCE STATUS: 1 - Symptomatic but completely ambulatory  Vitals:   06/30/20 5366  06/30/20 1030  BP: (!) 116/91 125/89  Pulse: 87 99  Resp: 14 16  Temp: 98.2 F (36.8 C) 99 F (37.2 C)  SpO2: 99% 100%   Filed Weights   06/29/20 2347  Weight: 152 lb (68.9 kg)    GENERAL:alert, no distress and comfortable SKIN: skin color, texture, turgor are normal, no rashes or significant lesions EYES: normal, conjunctiva are pink and non-injected, sclera clear OROPHARYNX:no exudate, no erythema and lips, buccal mucosa, and tongue normal  NECK: supple, thyroid normal size, non-tender, without nodularity LYMPH:  no palpable lymphadenopathy in the cervical, axillary or inguinal LUNGS: clear to auscultation and percussion with normal breathing effort HEART: regular rate & rhythm and no murmurs and no lower extremity edema ABDOMEN:abdomen soft, non-tender and normal bowel sounds Musculoskeletal:no cyanosis of digits and no clubbing  PSYCH: alert & oriented x 3 with fluent speech NEURO: no focal motor/sensory deficits Breast: right breast mass feels  more softer and mobile on palpation, it also feels smaller.  LABORATORY DATA:  I have reviewed the data as listed Lab Results  Component Value Date   WBC 6.0 06/30/2020   HGB 10.9 (L) 06/30/2020   HCT 34.1 (L) 06/30/2020   MCV 84.4 06/30/2020   PLT 161 06/30/2020   @LASTCHEM @  RADIOGRAPHIC STUDIES:  Reviewed recent labs.  All questions were answered. The patient knows to call the clinic with any problems, questions or concerns. I spent 50 minutes in the care of this patient including H and P, review of records, counseling and coordination of care.     Benay Pike, MD 06/30/2020 12:53 PM

## 2020-06-30 NOTE — ED Notes (Signed)
ED TO INPATIENT HANDOFF REPORT  Name/Age/Gender [name] 45 y.o. female  Code Status    Code Status Orders  (From admission, onward)         Start     Ordered   06/30/20 0356  Full code  Continuous        06/30/20 0359        Code Status History    Date Active Date Inactive Code Status Order ID Comments User Context   06/27/2019 1753 07/02/2019 1731 Full Code [id number]  [name], MD Inpatient   Advance Care Planning Activity      Home/SNF/Other Home  Chief Complaint Colitis with rectal bleeding [K52.9, K62.5]  Level of Care/Admitting Diagnosis ED Disposition    ED Disposition Condition [place]: [facility] [[id number]]  Level of Care: Med-Surg [16]  Covid Evaluation: Asymptomatic Screening Protocol (No Symptoms)  Diagnosis: Colitis with rectal bleeding [[id number]]  Admitting Physician: Reubin Milan [[id number]]  Attending Physician: Reubin Milan [[id number]]       Medical History Past Medical History:  Diagnosis Date  . Colon stricture ([place]) 05/07/2019  . Crohn's disease ([place])   . Diarrhea   . Family history of breast cancer 06/11/2020  . 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 Rectal bleeding   . Uterine fibroid   . Vitamin D deficiency 09/24/2013    Allergies Allergies  Allergen Reactions  . Other     BLOOD PRODUCT REFUSAL    IV Location/Drains/Wounds Patient Lines/Drains/Airways Status    Active Line/Drains/Airways    Name Placement date Placement time Site Days   Implanted Port 06/30/20 Left Chest 06/30/20  0206  Chest  less than 1   Peripheral IV 06/27/19 Right Hand 06/27/19  1206  Hand  369   CVC Single Lumen 06/23/20 Left Internal jugular 06/23/20  1107  Internal jugular  7   Incision (Closed) Abdomen Other (Comment) --  1621  -- --   Incision (Closed) 06/23/20 Chest Left 06/23/20  1109  -- 7   Incision - 5 Ports Abdomen Right Right  Mid;Lower Left Left 06/27/19  1230  -- 369          Labs/Imaging Results for orders placed or performed during the hospital encounter of 06/30/20 (from the past 48 hour(s))  CBC with Differential     Status: Abnormal   Collection Time: 06/29/20 11:52 PM  Result Value Ref Range   WBC 10.8 (H) 4.0 - 10.5 K/uL   RBC 4.49 3.87 - 5.11 MIL/uL   Hemoglobin 12.1 12.0 - 15.0 g/dL   HCT 38.0 36.0 - 46.0 %   MCV 84.6 80.0 - 100.0 fL   MCH 26.9 26.0 - 34.0 pg   MCHC 31.8 30.0 - 36.0 g/dL   RDW 14.0 11.5 - 15.5 %   Platelets 184 150 - 400 K/uL   nRBC 0.0 0.0 - 0.2 %    Comment: Performed at [facility], [place] [address] [place], [place] [address]   Neutrophils Relative % TEST RESCHEDULED %   Neutro Abs TEST RESCHEDULED 1.7 - 7.7 K/uL   Band Neutrophils TEST RESCHEDULED %   Lymphocytes Relative TEST RESCHEDULED %   Lymphs Abs TEST RESCHEDULED 0.7 - 4.0 K/uL   Monocytes Relative TEST RESCHEDULED %   Monocytes Absolute TEST RESCHEDULED 0.1 - 1.0 K/uL   Eosinophils Relative TEST RESCHEDULED %   Eosinophils Absolute TEST RESCHEDULED 0.0 -  0.5 K/uL   Basophils Relative TEST RESCHEDULED %   Basophils Absolute TEST RESCHEDULED 0.0 - 0.1 K/uL   WBC Morphology TEST RESCHEDULED    RBC Morphology TEST RESCHEDULED    Smear Review TEST RESCHEDULED    Other TEST RESCHEDULED %   nRBC TEST RESCHEDULED 0 /100 WBC   Metamyelocytes Relative TEST RESCHEDULED %   Myelocytes TEST RESCHEDULED %   Promyelocytes Relative TEST RESCHEDULED %   Blasts TEST RESCHEDULED %   Immature Granulocytes TEST RESCHEDULED %   Abs Immature Granulocytes TEST RESCHEDULED 0.00 - 0.07 K/uL  Comprehensive metabolic panel     Status: Abnormal   Collection Time: 06/29/20 11:52 PM  Result Value Ref Range   Sodium 139 135 - 145 mmol/L   Potassium 3.4 (L) 3.5 - 5.1 mmol/L   Chloride 104 98 - 111 mmol/L   CO2 26 22 - 32 mmol/L   Glucose, Bld 106 (H) 70 - 99 mg/dL    Comment: Glucose reference range applies only  to samples taken after fasting for at least 8 hours.   BUN 15 6 - 20 mg/dL   Creatinine, Ser 0.70 0.44 - 1.00 mg/dL   Calcium 8.6 (L) 8.9 - 10.3 mg/dL   Total Protein 7.5 6.5 - 8.1 g/dL   Albumin 3.8 3.5 - 5.0 g/dL   AST 23 15 - 41 U/L   ALT 21 0 - 44 U/L   Alkaline Phosphatase 98 38 - 126 U/L   Total Bilirubin 0.7 0.3 - 1.2 mg/dL   GFR, Estimated >60 >60 mL/min    Comment: (NOTE) Calculated using the CKD-EPI Creatinine Equation (2021)    Anion gap 9 5 - 15    Comment: Performed at [facility], [place] [address], [place], [place] [address]  Differential     Status: Abnormal   Collection Time: 06/30/20 12:04 AM  Result Value Ref Range   Neutrophils Relative % 72 %   Neutro Abs 7.6 1.7 - 7.7 K/uL   Lymphocytes Relative 15 %   Lymphs Abs 1.6 0.7 - 4.0 K/uL   Monocytes Relative 1 %   Monocytes Absolute 0.1 0.1 - 1.0 K/uL   Eosinophils Relative 0 %   Eosinophils Absolute 0.0 0.0 - 0.5 K/uL   Basophils Relative 1 %   Basophils Absolute 0.1 0.0 - 0.1 K/uL   Immature Granulocytes 11 %   Abs Immature Granulocytes 1.17 (H) 0.00 - 0.07 K/uL   Reactive, Benign Lymphocytes PRESENT     Comment: Performed at [facility], [place] [address] [place], [place] [address]  Urinalysis, Routine w reflex microscopic Urine, Clean Catch     Status: Abnormal   Collection Time: 06/30/20  3:02 AM  Result Value Ref Range   Color, Urine YELLOW YELLOW   APPearance CLEAR CLEAR   Specific Gravity, Urine 1.044 (H) 1.005 - 1.030   pH 6.0 5.0 - 8.0   Glucose, UA NEGATIVE NEGATIVE mg/dL   Hgb urine dipstick MODERATE (A) NEGATIVE   Bilirubin Urine NEGATIVE NEGATIVE   Ketones, ur NEGATIVE NEGATIVE mg/dL   Protein, ur NEGATIVE NEGATIVE mg/dL   Nitrite NEGATIVE NEGATIVE   Leukocytes,Ua NEGATIVE NEGATIVE   RBC / HPF 0-5 0 - 5 RBC/hpf   WBC, UA 0-5 0 - 5 WBC/hpf   Bacteria, UA NONE SEEN NONE SEEN   Squamous Epithelial / LPF 0-5 0 - 5    Comment: Performed at [facility], [place] [address] [place], [place] [address]  POC  occult blood, ED     Status: Abnormal   Collection Time: 06/30/20  3:14 AM  Result Value Ref Range   Fecal Occult Bld POSITIVE (A) NEGATIVE   CT ABDOMEN PELVIS W CONTRAST  Result Date: 06/30/2020 CLINICAL DATA:  Abdominal pain, hematochezia, recently began treatment for right breast cancer 06/25/2020, initially diagnosed February 2022 EXAM: CT ABDOMEN AND PELVIS WITH CONTRAST TECHNIQUE: Multidetector CT imaging of the abdomen and pelvis was performed using the standard protocol following bolus administration of intravenous contrast. CONTRAST:  187m OMNIPAQUE IOHEXOL 300 MG/ML  SOLN COMPARISON:  CT 04/02/2019 FINDINGS: Lower chest: Mild pectus deformity. Lung bases are clear. Normal heart size. No pericardial effusion. Irregular thickening of the tissues in the right breast lower outer quadrant likely corresponding to the abnormality seen on comparison breast MR. Hepatobiliary: No worrisome focal liver lesions. Smooth liver surface contour. Normal hepatic attenuation. Normal gallbladder and biliary tree. Pancreas: No pancreatic ductal dilatation or surrounding inflammatory changes. Spleen: Normal in size. No concerning splenic lesions. Adrenals/Urinary Tract: Normal adrenal glands. Kidneys are normally located with symmetric enhancement and excretion. No suspicious renal lesion, urolithiasis or hydronephrosis. Urinary bladder is unremarkable. Stomach/Bowel: Distal esophagus, stomach and duodenum are unremarkable. Postsurgical changes from prior partial colectomy with ileocolic anastomosis seen in the midline abdomen. Mild air distention is seen of the distal most segments of small bowel with some fecalization just proximal to the anastomotic line as well as slightly inspissated fecal contents in the colonic remnant. Some mild mural thickening is noted of the proximal colonic remnant as well as the rectosigmoid with faint mucosal  hyperenhancement. Some adjacent reactive adenopathy and increased vascularity is present as well. Vascular/Lymphatic: No significant vascular findings are present. No enlarged abdominal or pelvic lymph nodes. Reproductive: Enlarged, heterogeneous fibroid uterus. Multiple intramural, subserosal and submucosal fibroids throughout the uterus result in distortion of the endometrium though some questionable endometrial thickening may be present up to 25 mm which is greater than expected, similar to comparison CT imaging. No concerning adnexal lesions. Other: No abdominopelvic free fluid or free gas. No bowel containing hernias. Mild rectus diastasis. Postsurgical changes from prior vertical midline incision. Musculoskeletal: No acute osseous abnormality or suspicious osseous lesion. Musculature is normal and symmetric. IMPRESSION: 1. Postsurgical changes from prior subtotal colectomy with ileocolic anastomosis seen in the midline abdomen. Mild air distention of the distal most segments of small bowel with some fecalization just proximal to and across the patent anastomotic line. Inspissated fecal contents in the colonic remnant. Findings could reflect a mild ileus or slowed intestinal transit possibly a secondary finding to the additional inflammatory features of the colonic remnant suspicious for a colitis, favor inflammatory in this patient with history of Crohn's. 2. Enlarged, heterogeneous fibroid uterus. Multiple intramural, subserosal and submucosal fibroids throughout the uterus result in distortion of the endometrium though some questionable endometrial thickening may be present up to 25 mm which is greater than expected, similar to comparison CT imaging. Recommend further evaluation with pelvic ultrasound as clinically indicated. 3. Irregular thickening of the tissues in the right breast lower outer quadrant likely corresponding to the abnormality seen on comparison breast Recommend correlation with recent  mammography. Electronically Signed   By: PLovena LeM.D.   On: 06/30/2020 03:08    Pending Labs Unresulted Labs (From admission, onward)          Start     Ordered   07/01/20 0500  HIV Antibody (routine testing w rflx)  (HIV Antibody (Routine testing w reflex) panel)  Tomorrow morning,   R        06/30/20 0406   06/30/20 0730  CBC  Once-Timed,   STAT        06/30/20 0359   06/30/20 0730  Renal function panel  Once-Timed,   STAT        06/30/20 0359   06/30/20 0358  Magnesium  Add-on,   AD        06/30/20 0359   06/30/20 0325  SARS CORONAVIRUS 2 (TAT 6-24 HRS) Nasopharyngeal Nasopharyngeal Swab  (Tier 3 - Symptomatic/asymptomatic with Precautions)  Once,   STAT       Question Answer Comment  Is this test for diagnosis or screening Screening   Symptomatic for COVID-19 as defined by CDC No   Hospitalized for COVID-19 No   Admitted to ICU for COVID-19 No   Previously tested for COVID-19 Yes   Resident in a congregate (group) care setting Unknown   Employed in healthcare setting Unknown   Pregnant Unknown   Has patient completed COVID vaccination(s) (2 doses of Pfizer/Moderna 1 dose of [employer]) Unknown      06/30/20 0324   06/30/20 0324  C Difficile Quick Screen w PCR reflex  (C Difficile quick screen w PCR reflex panel)  Once, for 24 hours,   STAT       References:    CDiff Information Tool   06/30/20 0323          Vitals/Pain Today's Vitals   06/30/20 0247 06/30/20 0300 06/30/20 0352 06/30/20 0400  BP: (!) 136/92  (!) 129/91 121/76  Pulse: 98  98 93  Resp: 19  20 (!) 21  Temp:      TempSrc:      SpO2: 99%  97% 99%  Weight:      Height:      PainSc:  0-No pain      Isolation Precautions Airborne and Contact precautions  Medications Medications  acetaminophen (TYLENOL) tablet 650 mg (has no administration in time range)    Or  acetaminophen (TYLENOL) suppository 650 mg (has no administration in time range)  prochlorperazine (COMPAZINE) injection 5 mg  (has no administration in time range)  lactated ringers bolus 1,000 mL (has no administration in time range)  potassium chloride 10 mEq in 100 mL IVPB (has no administration in time range)  lidocaine-prilocaine (EMLA) cream ( Topical Given 06/30/20 0137)  iohexol (OMNIPAQUE) 300 MG/ML solution 100 mL (100 mLs Intravenous Contrast Given 06/30/20 0230)  acetaminophen (TYLENOL) tablet 650 mg (650 mg Oral Given 06/30/20 0200)    Mobility Independent

## 2020-06-30 NOTE — Progress Notes (Signed)
   06/30/20 0517  Assess: MEWS Score  Temp 98.5 F (36.9 C)  BP 133/90  Pulse Rate (!) 111  Resp 14  SpO2 99 %  O2 Device Room Air  Assess: MEWS Score  MEWS Temp 0  MEWS Systolic 0  MEWS Pulse 2  MEWS RR 0  MEWS LOC 0  MEWS Score 2  MEWS Score Color Yellow  Assess: if the MEWS score is Yellow or Red  Were vital signs taken at a resting state? Yes  Focused Assessment No change from prior assessment  Early Detection of Sepsis Score *See Row Information* Low  MEWS guidelines implemented *See Row Information* Yes  Treat  MEWS Interventions Escalated (See documentation below)  Pain Scale 0-10  Pain Score 5  Pain Type Acute pain  Pain Location Abdomen  Pain Orientation Lower  Pain Intervention(s) MD notified (Comment) (MD aware at bedside, patient refused pain medication to be ordered)  Take Vital Signs  Increase Vital Sign Frequency  Yellow: Q 2hr X 2 then Q 4hr X 2, if remains yellow, continue Q 4hrs  Escalate  MEWS: Escalate Yellow: discuss with charge nurse/RN and consider discussing with provider and RRT  Notify: Charge Nurse/RN  Name of Charge Nurse/RN Notified Stephanie, RN  Date Charge Nurse/RN Notified 06/30/20  Time Charge Nurse/RN Notified [phone]  Document  Patient Outcome Other (Comment) (Stable on unit)  Progress note created (see row info) Yes

## 2020-06-30 NOTE — Progress Notes (Signed)
Patient admitted this morning.  45 year old female with history of subtotal colectomy Crohn's disease iron deficiency anemia breast cancer admitted with hematochezia and diarrhea.  She is status post recent chemotherapy and Neulasta.  She was Hemoccult positive in the ED.  She is admitted for further management.  I appreciate GI and oncology input.  Hemoglobin stable.  Potassium low repleted.  Follow-up labs in a.m. and monitor her clinical course.  Advance diet as tolerated.

## 2020-06-30 NOTE — H&P (Signed)
History and Physical    [name] HYQ:[id number] DOB: October 30, 1975 DOA: 06/30/2020  PCP: Patient, [name]  Patient coming from: Home.  I have personally briefly reviewed patient's old medical records in [place]  Chief Complaint: Rectal bleeding.  HPI: [name] is a 45 y.o. female with medical history significant of colon stricture with history of subtotal colectomy, Crohn's disease, diarrhea, iron deficiency anemia, uterine fibroids, vitamin D deficiency, episodes of rectal bleeding who was recently diagnosed with breast cancer, [name] placed by [name] on 06/23/2020, seen by her oncologist [name] on 06/24/2020 and begun chemotherapy with carboplatin, docetaxel, pertuzumab and trastuzumab-anns on 06/26/2020 after premedication with palonosetron 0.25 mg IVP x1, dexamethasone 10 mg IVPB, fosaprepitant 150 mg IVPB, acetaminophen 650 mg p.o. x1 and diphenhydramine 50 mg p.o. x1.  The patient mentioned that she did not have any particular symptoms over the weekend, but mentions that since she received the Neulasta she has been having pain in her bones, particularly in her feet.  The patient also has had multiple bowel movements with hematochezia associated with colic-like pain in the RLQ.  No nausea, vomiting, melena or constipation.  No dysuria, frequency or hematuria.  She denies she denies fever, but complains of night sweats, chills and mild fatigue.  Denies rhinorrhea, sore throat, dyspnea, chest pain, dizziness, orthopnea or lower extremity edema.  No polyuria, polydipsia or polyphagia.  ED Course: Initial vital signs were temperature 99.1 F, pulse 117, respiration 18, BP 177/97 mmHg O2 sat 97% on room air.  The patient received 650 mg of acetaminophen p.o. and EMLA cream was used topically to assess her Port-A-Cath.  Labwork: Fecal occult blood was positive.  Urinalysis showed an elevated specific gravity of more than 1.044 and moderate hemoglobinuria.  CBC  with a white count of 10.8, hemoglobin 12.1 g/dL and platelets 184.  CMP showed potassium of 3.4 mmol/L.  Glucose of 106 and calcium 8.6 mg/dL.  The rest of the electrolytes, hepatic and renal function are within normal range.  Imaging: CT abdomen/pelvis with contrast showed postsurgical changes from previous subtotal colectomy with ileocolic anastomosis seen in the midline abdomen.  There is mild air distention of the most distal segments of small bowel with some fecalization just proximal to and across the patent anastomotic line.  Inspissated fecal contents and inflammatory changes in the colonic remnant.  Findings are suspicious for an ileus.  Multiple fibroids with pelvic ultrasound recommended.  Please see images and full radiology report for further details.  Review of Systems: As per HPI otherwise all other systems reviewed and are negative.  Past Medical History:  Diagnosis Date  . Colon stricture ([place]) 05/07/2019  . Crohn's disease ([place])   . Diarrhea   . Family history of breast cancer 06/11/2020  . 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 Rectal bleeding   . Uterine fibroid   . Vitamin D deficiency 09/24/2013   Past Surgical History:  Procedure Laterality Date  . COLECTOMY  06/27/2019  . COLONOSCOPY  2015  . PORTACATH PLACEMENT N/A 06/23/2020   Procedure: INSERTION PORT-A-CATH;  Surgeon: [name], MD;  Location: [facility];  Service: General;  Laterality: N/A;  START TIME OF 11:00 AM FOR 60 MINUTES [place] [facility]   Social History  reports that she has quit smoking. Her smoking use included cigarettes. She has never used smokeless tobacco. She reports current alcohol use of about 1.0 - 2.0 standard  drink of alcohol per week. She reports that she does not use drugs.  Allergies  Allergen Reactions  . Other     BLOOD PRODUCT REFUSAL   Family History  Problem Relation Age of Onset  . Breast cancer Maternal Aunt         dx unknown age  . Diabetes Father   . Diabetes Paternal Aunt   . Breast cancer Cousin        maternal cousin; dx unknown age  . Esophageal cancer Neg Hx   . Rectal cancer Neg Hx   . Stomach cancer Neg Hx   . Colon polyps Neg Hx   . Colon cancer Neg Hx    Prior to Admission medications   Medication Sig Start Date End Date Taking? Authorizing Provider  dexamethasone (DECADRON) 4 MG tablet Take 2 tablets (8 mg total) by mouth 2 (two) times daily. Start the day before Taxotere. Then take daily x 3 days after chemotherapy. 06/16/20  Yes Benay Pike, MD  ferrous sulfate 325 (65 FE) MG tablet Take 325 mg by mouth daily with breakfast. 07/02/19  Yes [provider]  lidocaine-prilocaine (EMLA) cream Apply 1 application topically as needed (Prior to port access). 06/16/20  Yes Benay Pike, MD  loratadine (CLARITIN) 10 MG tablet Take 10 mg by mouth See admin instructions. Take 7 days starting the day of injection   Yes [provider]  LORazepam (ATIVAN) 0.5 MG tablet Take 1 tablet (0.5 mg total) by mouth every 6 (six) hours as needed (Nausea or vomiting). 06/16/20  Yes Benay Pike, MD  Multiple Vitamin (MULTIVITAMIN) tablet Take 1 tablet by mouth daily.   Yes [provider]  ondansetron (ZOFRAN) 8 MG tablet Take 1 tablet (8 mg total) by mouth 2 (two) times daily as needed (Nausea or vomiting). Start on the third day after chemotherapy. 06/16/20  Yes Benay Pike, MD  pegfilgrastim-bmez (ZIEXTENZO) 6 MG/0.6ML injection Inject 0.6 mLs (6 mg total) into the skin every 21 ( twenty-one) days. First injection on 06/28/2020 06/22/20  Yes Iruku, Arletha Pili, MD  prochlorperazine (COMPAZINE) 10 MG tablet Take 1 tablet (10 mg total) by mouth every 6 (six) hours as needed (Nausea or vomiting). 06/16/20  Yes Benay Pike, MD   Physical Exam: Vitals:   06/30/20 0215 06/30/20 0247 06/30/20 0352 06/30/20 0400  BP: (!) 131/92 (!) 136/92 (!) 129/91 121/76  Pulse: 88 98 98 93   Resp: 15 19 20  (!) 21  Temp:      TempSrc:      SpO2: 99% 99% 97% 99%  Weight:      Height:       Constitutional: NAD, calm, comfortable Eyes: PERRL, lids and conjunctivae normal ENMT: Mucous membranes are mildly dry. Posterior pharynx clear of any exudate or lesions. Neck: normal, supple, no masses, no thyromegaly Respiratory: clear to auscultation bilaterally, no wheezing, no crackles. Normal respiratory effort. No accessory muscle use.  Chest: Left Port-A-Cath in place. Cardiovascular: Regular rate and rhythm, no murmurs / rubs / gallops. No extremity edema. 2+ pedal pulses. No carotid bruits.  Abdomen: No distention.  Bowel sounds positive.  Soft, positive RLQ tenderness, no guarding or rebound, no masses palpated. No hepatosplenomegaly. Bowel sounds positive.  Musculoskeletal: no clubbing / cyanosis. Good ROM, no contractures. Normal muscle tone.  Skin: no rashes, lesions, ulcers on limited dermatological examination. Neurologic: CN 2-12 grossly intact. Sensation intact, DTR normal. Strength 5/5 in all 4.  Psychiatric: Normal judgment and insight. Alert and oriented x 3. Normal  mood.   Labs on Admission: I have personally reviewed following labs and imaging studies  CBC: Recent Labs  Lab 06/24/20 0948 06/29/20 2352 06/30/20 0004  WBC 13.9* 10.8*  --   NEUTROABS 11.6* TEST RESCHEDULED 7.6  HGB 11.9* 12.1  --   HCT 37.6 38.0  --   MCV 83.7 84.6  --   PLT 281 184  --     Basic Metabolic Panel: Recent Labs  Lab 06/24/20 0948 06/29/20 2352  NA 141 139  K 4.2 3.4*  CL 106 104  CO2 25 26  GLUCOSE 99 106*  BUN 6 15  CREATININE 0.86 0.70  CALCIUM 9.4 8.6*  MG 2.0  --     GFR: Estimated Creatinine Clearance: 85.3 mL/min (by C-G formula based on SCr of 0.7 mg/dL).  Liver Function Tests: Recent Labs  Lab 06/24/20 0948 06/29/20 2352  AST 15 23  ALT 12 21  ALKPHOS 93 98  BILITOT 0.6 0.7  PROT 8.0 7.5  ALBUMIN 4.0 3.8    Urine analysis:    Component Value  Date/Time   COLORURINE YELLOW 06/30/2020 0302   APPEARANCEUR CLEAR 06/30/2020 0302   LABSPEC 1.044 (H) 06/30/2020 0302   PHURINE 6.0 06/30/2020 0302   GLUCOSEU NEGATIVE 06/30/2020 0302   HGBUR MODERATE (A) 06/30/2020 0302   BILIRUBINUR NEGATIVE 06/30/2020 0302   KETONESUR NEGATIVE 06/30/2020 0302   PROTEINUR NEGATIVE 06/30/2020 0302   UROBILINOGEN 0.2 06/24/2007 0321   NITRITE NEGATIVE 06/30/2020 0302   LEUKOCYTESUR NEGATIVE 06/30/2020 0302    Radiological Exams on Admission: CT ABDOMEN PELVIS W CONTRAST  Result Date: 06/30/2020 CLINICAL DATA:  Abdominal pain, hematochezia, recently began treatment for right breast cancer 06/25/2020, initially diagnosed February 2022 EXAM: CT ABDOMEN AND PELVIS WITH CONTRAST TECHNIQUE: Multidetector CT imaging of the abdomen and pelvis was performed using the standard protocol following bolus administration of intravenous contrast. CONTRAST:  113m OMNIPAQUE IOHEXOL 300 MG/ML  SOLN COMPARISON:  CT 04/02/2019 FINDINGS: Lower chest: Mild pectus deformity. Lung bases are clear. Normal heart size. No pericardial effusion. Irregular thickening of the tissues in the right breast lower outer quadrant likely corresponding to the abnormality seen on comparison breast MR. Hepatobiliary: No worrisome focal liver lesions. Smooth liver surface contour. Normal hepatic attenuation. Normal gallbladder and biliary tree. Pancreas: No pancreatic ductal dilatation or surrounding inflammatory changes. Spleen: Normal in size. No concerning splenic lesions. Adrenals/Urinary Tract: Normal adrenal glands. Kidneys are normally located with symmetric enhancement and excretion. No suspicious renal lesion, urolithiasis or hydronephrosis. Urinary bladder is unremarkable. Stomach/Bowel: Distal esophagus, stomach and duodenum are unremarkable. Postsurgical changes from prior partial colectomy with ileocolic anastomosis seen in the midline abdomen. Mild air distention is seen of the distal most  segments of small bowel with some fecalization just proximal to the anastomotic line as well as slightly inspissated fecal contents in the colonic remnant. Some mild mural thickening is noted of the proximal colonic remnant as well as the rectosigmoid with faint mucosal hyperenhancement. Some adjacent reactive adenopathy and increased vascularity is present as well. Vascular/Lymphatic: No significant vascular findings are present. No enlarged abdominal or pelvic lymph nodes. Reproductive: Enlarged, heterogeneous fibroid uterus. Multiple intramural, subserosal and submucosal fibroids throughout the uterus result in distortion of the endometrium though some questionable endometrial thickening may be present up to 25 mm which is greater than expected, similar to comparison CT imaging. No concerning adnexal lesions. Other: No abdominopelvic free fluid or free gas. No bowel containing hernias. Mild rectus diastasis. Postsurgical changes from prior vertical  midline incision. Musculoskeletal: No acute osseous abnormality or suspicious osseous lesion. Musculature is normal and symmetric. IMPRESSION: 1. Postsurgical changes from prior subtotal colectomy with ileocolic anastomosis seen in the midline abdomen. Mild air distention of the distal most segments of small bowel with some fecalization just proximal to and across the patent anastomotic line. Inspissated fecal contents in the colonic remnant. Findings could reflect a mild ileus or slowed intestinal transit possibly a secondary finding to the additional inflammatory features of the colonic remnant suspicious for a colitis, favor inflammatory in this patient with history of Crohn's. 2. Enlarged, heterogeneous fibroid uterus. Multiple intramural, subserosal and submucosal fibroids throughout the uterus result in distortion of the endometrium though some questionable endometrial thickening may be present up to 25 mm which is greater than expected, similar to comparison CT  imaging. Recommend further evaluation with pelvic ultrasound as clinically indicated. 3. Irregular thickening of the tissues in the right breast lower outer quadrant likely corresponding to the abnormality seen on comparison breast Recommend correlation with recent mammography. Electronically Signed   By: Lovena Le M.D.   On: 06/30/2020 03:08    EKG: Independently reviewed.  Assessment/Plan Principal Problem:   Colitis with rectal bleeding   Ileus (HCC) Keep NPO. Continue IV fluids. Optimize electrolytes. Observation/MedSurg. Antiemetics as needed. Famotidine 20 mg IVPB twice daily. Analgesics as needed, but try to limit use.  Active Problems:   Crohn's disease ([place]) Was in remission since partial colectomy. Exacerbation of Crohn's? Medication side effect? Consider GI evaluation.    Hypokalemia Replacing. Follow-up potassium level as needed.    Hypocalcemia Has history of vitamin D deficiency. Repeat BMP later this morning.    Malignant neoplasm of upper-outer quadrant of right breast in female, estrogen receptor positive ([place]) Follow-up with [name] [name] will follow in the future for possible mastectomy.   DVT prophylaxis: SCDs. Code Status:   Full code. Family Communication: Disposition Plan:   Patient is from:  Home.  Anticipated DC to:  Home.  Anticipated DC date:  07/01/2020.  Anticipated DC barriers: Clinical status.  Consults called: Admission status:  Observation/MedSurg.  Severity of Illness:  High severity due to multiple episodes of rectal bleeding associated with abdominal pain with imaging suspicious for ileus and colitis.  The patient will remain forced IV hydration and symptomatic treatment.  Reubin Milan MD [facility]  How to contact the [facility] Attending or Consulting provider [place] or covering provider during after hours [place], for this patient?   1. Check the care team in [facility] and look for a) attending/consulting [facility]  provider listed and b) the [facility] team listed 2. Log into [email or web address] and use [place]'s universal password to access. If you do not have the password, please contact the hospital operator. 3. Locate the [facility] provider you are looking for under [facility] and page to a number that you can be directly reached. 4. If you still have difficulty reaching the provider, please page the [facility] (Director on Call) for the Hospitalists listed on amion for assistance.  06/30/2020, 4:18 AM   This document was prepared using Dragon voice recognition software and may contain some unintended transcription errors.

## 2020-06-30 NOTE — ED Notes (Signed)
Pt ambulatory to restroom unassisted

## 2020-07-01 ENCOUNTER — Inpatient Hospital Stay (HOSPITAL_COMMUNITY): Payer: Managed Care, Other (non HMO) | Admitting: Certified Registered"

## 2020-07-01 ENCOUNTER — Inpatient Hospital Stay: Payer: Managed Care, Other (non HMO) | Admitting: Hematology and Oncology

## 2020-07-01 ENCOUNTER — Inpatient Hospital Stay: Payer: Managed Care, Other (non HMO)

## 2020-07-01 ENCOUNTER — Encounter (HOSPITAL_COMMUNITY)
Admission: EM | Disposition: A | Discharge status: Home / Self Care | Payer: Self-pay | Source: Home / Self Care | Attending: Internal Medicine

## 2020-07-01 ENCOUNTER — Encounter (HOSPITAL_COMMUNITY): Payer: Self-pay | Admitting: Internal Medicine

## 2020-07-01 ENCOUNTER — Encounter: Payer: Self-pay | Admitting: *Deleted

## 2020-07-01 ENCOUNTER — Telehealth: Payer: Self-pay | Admitting: Hematology and Oncology

## 2020-07-01 DIAGNOSIS — K625 Hemorrhage of anus and rectum: Secondary | ICD-10-CM | POA: Diagnosis not present

## 2020-07-01 DIAGNOSIS — K529 Noninfective gastroenteritis and colitis, unspecified: Secondary | ICD-10-CM | POA: Diagnosis not present

## 2020-07-01 DIAGNOSIS — K6289 Other specified diseases of anus and rectum: Secondary | ICD-10-CM

## 2020-07-01 DIAGNOSIS — K921 Melena: Secondary | ICD-10-CM

## 2020-07-01 HISTORY — PX: FLEXIBLE SIGMOIDOSCOPY: SHX5431

## 2020-07-01 HISTORY — PX: BIOPSY: SHX5522

## 2020-07-01 LAB — BASIC METABOLIC PANEL
Anion gap: 8 (ref 5–15)
BUN: 10 mg/dL (ref 6–20)
CO2: 26 mmol/L (ref 22–32)
Calcium: 8.9 mg/dL (ref 8.9–10.3)
Chloride: 101 mmol/L (ref 98–111)
Creatinine, Ser: 0.81 mg/dL (ref 0.44–1.00)
GFR, Estimated: >60 mL/min (ref >60)
Glucose, Bld: 99 mg/dL (ref 70–99)
Potassium: 3.9 mmol/L (ref 3.5–5.1)
Sodium: 135 mmol/L (ref 135–145)

## 2020-07-01 LAB — GASTROINTESTINAL PANEL BY PCR, STOOL (REPLACES STOOL CULTURE)

## 2020-07-01 LAB — CBC
HCT: 36 % (ref 36.0–46.0)
Hemoglobin: 11.7 g/dL — ABNORMAL LOW (ref 12.0–15.0)
MCH: 27.2 pg (ref 26.0–34.0)
MCHC: 32.5 g/dL (ref 30.0–36.0)
MCV: 83.7 fL (ref 80.0–100.0)
Platelets: 163 10*3/uL (ref 150–400)
RBC: 4.3 MIL/uL (ref 3.87–5.11)
RDW: 13.9 % (ref 11.5–15.5)
WBC: 2 10*3/uL — ABNORMAL LOW (ref 4.0–10.5)
nRBC: 0 % (ref 0.0–0.2)

## 2020-07-01 LAB — MAGNESIUM: Magnesium: 2.2 mg/dL (ref 1.7–2.4)

## 2020-07-01 LAB — HIV ANTIBODY (ROUTINE TESTING W REFLEX): HIV Screen 4th Generation wRfx: NONREACTIVE

## 2020-07-01 SURGERY — SIGMOIDOSCOPY, FLEXIBLE
Anesthesia: Monitor Anesthesia Care

## 2020-07-01 MED ORDER — METRONIDAZOLE IN NACL 5-0.79 MG/ML-% IV SOLN
500.0000 mg | Freq: Three times a day (TID) | INTRAVENOUS | Status: DC
  Administered 2020-07-01 – 2020-07-02 (×4): 500 mg via INTRAVENOUS
  Filled 2020-07-01 (×4): qty 100

## 2020-07-01 MED ORDER — PROPOFOL 10 MG/ML IV BOLUS
INTRAVENOUS | Status: DC | PRN
  Administered 2020-07-01: 20 mg via INTRAVENOUS

## 2020-07-01 MED ORDER — LORATADINE 10 MG PO TABS
10.0000 mg | ORAL_TABLET | Freq: Every day | ORAL | Status: DC
  Administered 2020-07-01 – 2020-07-02 (×2): 10 mg via ORAL
  Filled 2020-07-01 (×2): qty 1

## 2020-07-01 MED ORDER — PROPOFOL 500 MG/50ML IV EMUL
INTRAVENOUS | Status: DC | PRN
  Administered 2020-07-01: 125 ug/kg/min via INTRAVENOUS

## 2020-07-01 MED ORDER — TRAMADOL HCL 50 MG PO TABS: 50.0000 mg | ORAL_TABLET | Freq: Four times a day (QID) | ORAL | Status: DC | PRN

## 2020-07-01 MED ORDER — LACTATED RINGERS IV SOLN: INTRAVENOUS | Status: DC

## 2020-07-01 MED ORDER — SODIUM CHLORIDE 0.9 % IV SOLN: INTRAVENOUS | Status: DC

## 2020-07-01 MED ORDER — PHENYLEPHRINE 40 MCG/ML (10ML) SYRINGE FOR IV PUSH (FOR BLOOD PRESSURE SUPPORT)
PREFILLED_SYRINGE | INTRAVENOUS | Status: DC | PRN
  Administered 2020-07-01 (×2): 40 ug via INTRAVENOUS

## 2020-07-01 MED ORDER — PROPOFOL 500 MG/50ML IV EMUL
INTRAVENOUS | Status: AC
  Filled 2020-07-01: qty 50

## 2020-07-01 MED ORDER — PROPOFOL 10 MG/ML IV BOLUS
INTRAVENOUS | Status: AC
  Filled 2020-07-01: qty 20

## 2020-07-01 NOTE — Progress Notes (Signed)
GI UPDATE:  See flex sig for details of findings. Rectum is inflamed, rest of colon and anastomosis looks okay. I took some biopsies. I told the patient while it is possible this could be Crohn's, I think less likely. This would be unusual to have Crohn's flare in the setting of chemotherapy, which often helps put IBD into remission. Will await path. Will also await stool studies to rule out infection. Perhaps most likely is reaction from Docetaxil. We discussed treatment options. The patient's [place] has fallen, concerned she may develop worsening neutropenia, and in that setting would not want to use suppository / enema therapy (mesalamine, short chain fatty acid enemas, etc). Will cover her with flagyl in case her neutropenia worsens over the next day or so. She has received Neupogen. Hopefully with more time this improves on its own with supportive measures but will await her course. I think she can eat, will start flagyl, await biopsies, hopefully back tomorrow.  G[place] Cellar, MD [facility]

## 2020-07-01 NOTE — Anesthesia Postprocedure Evaluation (Signed)
Anesthesia Post Note  Patient: [name]  Procedure(s) Performed: FLEXIBLE SIGMOIDOSCOPY (N/A ) BIOPSY     Patient location during evaluation: Endoscopy Anesthesia Type: MAC Level of consciousness: awake Pain management: pain level controlled Vital Signs Assessment: post-procedure vital signs reviewed and stable Respiratory status: spontaneous breathing Cardiovascular status: stable Postop Assessment: no apparent nausea or vomiting Anesthetic complications: no   No complications documented.  Last Vitals:  Vitals:   07/01/20 1210 07/01/20 1220  BP: 111/71 115/78  Pulse: 94 97  Resp: 18 (!) 21  Temp: 37 C   SpO2: 100% 100%    Last Pain:  Vitals:   07/01/20 1220  TempSrc:   PainSc: 0-No pain                 Huston Foley

## 2020-07-01 NOTE — Op Note (Addendum)
[facility] Patient Name: [name] Procedure Date: 07/01/2020 MRN: [id number] Attending MD: Carlota Raspberry. Havery Moros , MD Date of Birth: [birth year] CSN: [id number] Age: 45 Admit Type: Inpatient Procedure:                Flexible Sigmoidoscopy Indications:              Lower abdominal pain, Hematochezia, Diarrhea -                            history of Crohn's disease thought to be in                            remission, now on chemotherapy for breast cancer,                            recently started prior to onset of symptoms                            including use of Docetaxil Providers:                Carlota Raspberry. Havery Moros, MD, Clyde Lundborg, RN,                            Lesia Sago, Technician, Jefm Miles CRNA Referring MD:              Medicines:                Monitored Anesthesia Care Complications:            No immediate complications. Estimated blood loss:                            Minimal. Estimated Blood Loss:     Estimated blood loss was minimal. Procedure:                Pre-Anesthesia Assessment:                           - Prior to the procedure, a History and Physical                            was performed, and patient medications and                            allergies were reviewed. The patient's tolerance of                            previous anesthesia was also reviewed. The risks                            and benefits of the procedure and the sedation                            options and risks were discussed with the patient.  All questions were answered, and informed consent                            was obtained. Prior Anticoagulants: The patient has                            taken no previous anticoagulant or antiplatelet                            agents. ASA Grade Assessment: III - A patient with                            severe systemic disease. After reviewing the risks                             and benefits, the patient was deemed in                            satisfactory condition to undergo the procedure.                           After obtaining informed consent, the scope was                            passed under direct vision. The PCF-H190DL                            ([id number] Olympus pediatric colonscope was                            introduced through the anus and advanced to the the                            ileo-sigmoid anastomosis. The flexible                            sigmoidoscopy was accomplished without difficulty.                            The patient tolerated the procedure well. The                            quality of the bowel preparation was fair. Scope In: 11:40:16 AM Scope Out: 11:49:30 AM Total Procedure Duration: 0 hours 9 minutes 14 seconds  Findings:      The perianal and digital rectal examinations were normal.      The ileum appeared normal.      The surgical anastomosis in the sigmoid colon appeared normal.      Diffuse inflammation characterized by altered vascularity, erosions,       erythema, friability, granularity, loss of vascularity and mucus was       found in the distal 10-15cm of the rectum. Biopsies were taken with a       cold forceps for histology.      The exam was otherwise normal throughout the  examined colon. No       inflammation noted proximal to 10-15cm from the anal verge. Prep was       fair. Impression:               - The terminal ileum is normal.                           - The colonic anastomosis is normal.                           - Diffuse inflammation was found in the distal                            rectum. Biopsied.                           - Normal colon otherwise                           Differential diagnosis includes drug induced                            colitis (Docetaxil), Crohn's disease, vs                            infectious. I think drug reaction is most likely.                             It would be unusual to have a Crohn's flare in the                            setting of chemotherapy and her surgical                            anastomosis and ileum, looked normal. Moderate Sedation:      No moderate sedation, case performed with MAC Recommendation:           - Return patient to hospital ward for ongoing care.                           - Resume regular diet as tolerated.                           - Await pathology results. I have asked this to be                            sent rush, hopefully may have a result back by                            tomorrow?                           - Await GI pathogen panel, exclude infection                           -  Will discuss case with Oncology regarding                            possibility of Docetaxil induced colitis which is                            well-known but rare association, in regards to                            treatment options. Consider short chain fatty acid                            enema, or rectal mesalamine suppositories although                            WBC has dropped this AM, will need to keep an eye                            on ANC. Would hold off on steroids right now.                            Consider empiric flagyl.                           - GI service will follow Procedure Code(s):        --- Professional ---                           [phone], Sigmoidoscopy, flexible; with biopsy, single                            or multiple Diagnosis Code(s):        --- Professional ---                           K62.89, Other specified diseases of anus and rectum                           R10.30, Lower abdominal pain, unspecified                           K92.1, Melena (includes Hematochezia)                           R19.7, Diarrhea, unspecified CPT copyright 2019 American Medical Association. All rights reserved. The codes documented in this report are preliminary and upon coder review may  be  revised to meet current compliance requirements. Remo Lipps P. Annarae Macnair, MD 07/01/2020 12:12:53 PM This report has been signed electronically. Number of Addenda: 0

## 2020-07-01 NOTE — Transfer of Care (Signed)
Immediate Anesthesia Transfer of Care Note  Patient: [name]  Procedure(s) Performed: FLEXIBLE SIGMOIDOSCOPY (N/A ) BIOPSY  Patient Location: PACU and Endoscopy Unit  Anesthesia Type:MAC  Level of Consciousness: drowsy  Airway & Oxygen Therapy: Patient Spontanous Breathing and Patient connected to face mask oxygen  Post-op Assessment: Report given to RN and Post -op Vital signs reviewed and stable  Post vital signs: Reviewed and stable  Last Vitals:  Vitals Value Taken Time  BP    Temp    Pulse    Resp    SpO2      Last Pain:  Vitals:   07/01/20 1011  TempSrc: Oral  PainSc: 0-No pain      Patients Stated Pain Goal: 0 (56/[id number]/94 [id number])  Complications: No complications documented.

## 2020-07-01 NOTE — Anesthesia Procedure Notes (Signed)
Procedure Name: MAC Date/Time: 07/01/2020 11:35 AM Performed by: Eben Burow, CRNA Pre-anesthesia Checklist: Patient identified, Emergency Drugs available, Suction available, Patient being monitored and Timeout performed Oxygen Delivery Method: Simple face mask Placement Confirmation: positive ETCO2

## 2020-07-01 NOTE — Progress Notes (Signed)
PROGRESS NOTE    [name]  XTA:[id number] DOB: 1975-06-17 DOA: 06/30/2020 PCP: Patient, [name]    Brief Narrative: HPI per Dr. Olevia Bowens on 06/30/2020 [name] is a 45 y.o. female with medical history significant of colon stricture with history of subtotal colectomy, Crohn's disease, diarrhea, iron deficiency anemia, uterine fibroids, vitamin D deficiency, episodes of rectal bleeding who was recently diagnosed with breast cancer, [name] placed by [name] on 06/23/2020, seen by her oncologist [name] on 06/24/2020 and begun chemotherapy with carboplatin, docetaxel, pertuzumab and trastuzumab-anns on 06/26/2020 after premedication with palonosetron 0.25 mg IVP x1, dexamethasone 10 mg IVPB, fosaprepitant 150 mg IVPB, acetaminophen 650 mg p.o. x1 and diphenhydramine 50 mg p.o. x1.  The patient mentioned that she did not have any particular symptoms over the weekend, but mentions that since she received the Neulasta she has been having pain in her bones, particularly in her feet.  The patient also has had multiple bowel movements with hematochezia associated with colic-like pain in the RLQ.  No nausea, vomiting, melena or constipation.  No dysuria, frequency or hematuria.  She denies she denies fever, but complains of night sweats, chills and mild fatigue.  Denies rhinorrhea, sore throat, dyspnea, chest pain, dizziness, orthopnea or lower extremity edema.  No polyuria, polydipsia or polyphagia.  ED Course: Initial vital signs were temperature 99.1 F, pulse 117, respiration 18, BP 177/97 mmHg O2 sat 97% on room air.  The patient received 650 mg of acetaminophen p.o. and EMLA cream was used topically to assess her Norris.  Labwork: Fecal occult blood was positive.  Urinalysis showed an elevated specific gravity of more than 1.044 and moderate hemoglobinuria.  CBC with a white count of 10.8, hemoglobin 12.1 g/dL and platelets 184.  CMP showed potassium of 3.4 mmol/L.   Glucose of 106 and calcium 8.6 mg/dL.  The rest of the electrolytes, hepatic and renal function are within normal range.   Imaging: CT abdomen/pelvis with contrast showed postsurgical changes from previous subtotal colectomy with ileocolic anastomosis seen in the midline abdomen.  There is mild air distention of the most distal segments of small bowel with some fecalization just proximal to and across the patent anastomotic line.  Inspissated fecal contents and inflammatory changes in the colonic remnant.  Findings are suspicious for an ileus.  Multiple fibroids with pelvic ultrasound recommended.  Please see images and full radiology report for further details. Assessment & Plan:   Principal Problem:   Colitis with rectal bleeding Active Problems:   Crohn's disease ([name])   Malignant neoplasm of upper-outer quadrant of right breast in female, estrogen receptor positive ([name])   Hypokalemia   Ileus (HCC)   Hypocalcemia   Rectal bleed   #1 bloody diarrhea Crohn's versus drug-induced-patient admitted with bloody diarrhea, status post first session of chemotherapy with docetaxel and Neulasta for breast cancer  C. difficile negative Patient has history of Crohn's stricture treated with subtotal colectomy and ileocolonic anastomosis.  She has history of small bowel and colonic Crohn's disease. Flex sig 07/01/2020-diffuse inflammation in the distal rectum biopsied.  Normal colon otherwise. GI thinks this could be drug reaction from docetaxel though rare. She started on regular diet  #2 malignant neoplasm of the right breast upper outer quadrant ER positive-followed by oncology.  [name] to follow in the future for possible mastectomy.  #3 hypokalemia/hypocalcemia resolved  #4 anemia stable hemoglobin 11.7  #5 neutropenia white count dropped from 6 to 2.  Follow-up in a.m.  #  6 soft blood pressure-  Estimated body mass index is 26.57 kg/m as calculated from the following:   Height as of  this encounter: 5' 3"  (1.6 m).   Weight as of this encounter: 68 kg.  DVT prophylaxis: None due to bloody diarrhea  code Status: Full code Family Communication: None at bedside today Disposition Plan:  Status is: Inpatient  Dispo: The patient is from: Home              Anticipated d/c is to: Home              Patient currently is not medically stable to d/c.   Difficult to place patient na   Consultants:   GI and oncology  Procedures: None Antimicrobials: Flagyl 07/01/2020  Subjective: Patient resting in bed had 1 loose BM overnight did notice some fresh blood in the stool  Objective: Vitals:   07/01/20 1200 07/01/20 1206 07/01/20 1210 07/01/20 1220  BP: (!) 87/62  111/71 115/78  Pulse: 93 92 94 97  Resp: 19 (!) 21 18 (!) 21  Temp:   98.6 F (37 C)   TempSrc:   Oral   SpO2: 100% 100% 100% 100%  Weight:      Height:        Intake/Output Summary (Last 24 hours) at 07/01/2020 1441 Last data filed at 07/01/2020 1226 Gross per 24 hour  Intake 623.05 ml  Output --  Net 623.05 ml   Filed Weights   06/29/20 2347 07/01/20 1011  Weight: 68.9 kg 68 kg    Examination:  General exam: Appears calm and comfortable  Respiratory system: Clear to auscultation. Respiratory effort normal. Cardiovascular system: S1 & S2 heard, RRR. No JVD, murmurs, rubs, gallops or clicks. No pedal edema. Gastrointestinal system: Abdomen is nondistended, soft and nontender. No organomegaly or masses felt. Normal bowel sounds heard. Central nervous system: Alert and oriented. No focal neurological deficits. Extremities: Symmetric 5 x 5 power. Skin: No rashes, lesions or ulcers Psychiatry: Judgement and insight appear normal. Mood & affect appropriate.     Data Reviewed: I have personally reviewed following labs and imaging studies  CBC: Recent Labs  Lab 06/29/20 2352 06/30/20 0004 06/30/20 0727 07/01/20 0516  WBC 10.8*  --  6.0 2.0*  NEUTROABS TEST RESCHEDULED 7.6  --   --   HGB 12.1   --  10.9* 11.7*  HCT 38.0  --  34.1* 36.0  MCV 84.6  --  84.4 83.7  PLT 184  --  161 644   Basic Metabolic Panel: Recent Labs  Lab 06/29/20 2352 06/30/20 0727 07/01/20 0516  NA 139 137 135  K 3.4* 3.7 3.9  CL 104 105 101  CO2 26 24 26   GLUCOSE 106* 92 99  BUN 15 11 10   CREATININE 0.70 0.58 0.81  CALCIUM 8.6* 8.3* 8.9  MG 2.4  --  2.2  PHOS  --  3.3  --    GFR: Estimated Creatinine Clearance: 83.7 mL/min (by C-G formula based on SCr of 0.81 mg/dL). Liver Function Tests: Recent Labs  Lab 06/29/20 2352 06/30/20 0727  AST 23  --   ALT 21  --   ALKPHOS 98  --   BILITOT 0.7  --   PROT 7.5  --   ALBUMIN 3.8 3.4*   No results for input(s): LIPASE, AMYLASE in the last 168 hours. No results for input(s): AMMONIA in the last 168 hours. Coagulation Profile: No results for input(s): INR, PROTIME in the last 168 hours.  Cardiac Enzymes: No results for input(s): CKTOTAL, CKMB, CKMBINDEX, TROPONINI in the last 168 hours. BNP (last 3 results) No results for input(s): PROBNP in the last 8760 hours. HbA1C: No results for input(s): HGBA1C in the last 72 hours. CBG: No results for input(s): GLUCAP in the last 168 hours. Lipid Profile: No results for input(s): CHOL, HDL, LDLCALC, TRIG, CHOLHDL, LDLDIRECT in the last 72 hours. Thyroid Function Tests: No results for input(s): TSH, T4TOTAL, FREET4, T3FREE, THYROIDAB in the last 72 hours. Anemia Panel: No results for input(s): VITAMINB12, FOLATE, FERRITIN, TIBC, IRON, RETICCTPCT in the last 72 hours. Sepsis Labs: No results for input(s): PROCALCITON, LATICACIDVEN in the last 168 hours.  Recent Results (from the past 240 hour(s))  SARS CORONAVIRUS 2 (TAT 6-24 HRS) Nasopharyngeal Nasopharyngeal Swab     Status: None   Collection Time: 06/30/20  3:30 AM   Specimen: Nasopharyngeal Swab  Result Value Ref Range Status   SARS Coronavirus 2 NEGATIVE NEGATIVE Final    Comment: (NOTE) SARS-CoV-2 target nucleic acids are NOT  DETECTED.  The SARS-CoV-2 RNA is generally detectable in upper and lower respiratory specimens during the acute phase of infection. Negative results do not preclude SARS-CoV-2 infection, do not rule out co-infections with other pathogens, and should not be used as the sole basis for treatment or other patient management decisions. Negative results must be combined with clinical observations, patient history, and epidemiological information. The expected result is Negative.  Fact Sheet for Patients: SugarRoll.be  Fact Sheet for Healthcare Providers: https://www.woods-.com/  This test is not yet approved or cleared by the [place] FDA and  has been authorized for detection and/or diagnosis of SARS-CoV-2 by FDA under an Emergency Use Authorization (EUA). This EUA will remain  in effect (meaning this test can be used) for the duration of the COVID-19 declaration under Se ction 564(b)(1) of the Act, 21 U.S.C. section 360bbb-3(b)(1), unless the authorization is terminated or revoked sooner.  Performed at [facility], [place] [address], [place], [place] [address]   C Difficile Quick Screen w PCR reflex     Status: None   Collection Time: 06/30/20  5:48 AM   Specimen: STOOL  Result Value Ref Range Status   C Diff antigen NEGATIVE NEGATIVE Final   C Diff toxin NEGATIVE NEGATIVE Final   C Diff interpretation No C. difficile detected.  Final    Comment: Performed at [facility], [place] [address], [place], [place] [address]  Gastrointestinal Panel by PCR , Stool     Status: None   Collection Time: 06/30/20  5:48 AM   Specimen: Stool  Result Value Ref Range Status   Campylobacter species NOT DETECTED NOT DETECTED Final   Plesimonas shigelloides NOT DETECTED NOT DETECTED Final   Salmonella species NOT DETECTED NOT DETECTED Final   Yersinia enterocolitica NOT DETECTED NOT DETECTED Final   Vibrio species NOT  DETECTED NOT DETECTED Final   Vibrio cholerae NOT DETECTED NOT DETECTED Final   Enteroaggregative E coli (EAEC) NOT DETECTED NOT DETECTED Final   Enteropathogenic E coli (EPEC) NOT DETECTED NOT DETECTED Final   Enterotoxigenic E coli (ETEC) NOT DETECTED NOT DETECTED Final   Shiga like toxin producing E coli (STEC) NOT DETECTED NOT DETECTED Final   Shigella/Enteroinvasive E coli (EIEC) NOT DETECTED NOT DETECTED Final   Cryptosporidium NOT DETECTED NOT DETECTED Final   Cyclospora cayetanensis NOT DETECTED NOT DETECTED Final   Entamoeba histolytica NOT DETECTED NOT DETECTED Final   Giardia lamblia NOT DETECTED NOT  DETECTED Final   Adenovirus F40/41 NOT DETECTED NOT DETECTED Final   Astrovirus NOT DETECTED NOT DETECTED Final   Norovirus GI/GII NOT DETECTED NOT DETECTED Final   Rotavirus A NOT DETECTED NOT DETECTED Final   Sapovirus (I, II, IV, and V) NOT DETECTED NOT DETECTED Final    Comment: Performed at [facility], [address], [place], [place] [address]         Radiology Studies: CT ABDOMEN PELVIS W CONTRAST  Result Date: 06/30/2020 CLINICAL DATA:  Abdominal pain, hematochezia, recently began treatment for right breast cancer 06/25/2020, initially diagnosed February 2022 EXAM: CT ABDOMEN AND PELVIS WITH CONTRAST TECHNIQUE: Multidetector CT imaging of the abdomen and pelvis was performed using the standard protocol following bolus administration of intravenous contrast. CONTRAST:  129m OMNIPAQUE IOHEXOL 300 MG/ML  SOLN COMPARISON:  CT 04/02/2019 FINDINGS: Lower chest: Mild pectus deformity. Lung bases are clear. Normal heart size. No pericardial effusion. Irregular thickening of the tissues in the right breast lower outer quadrant likely corresponding to the abnormality seen on comparison breast MR. Hepatobiliary: No worrisome focal liver lesions. Smooth liver surface contour. Normal hepatic attenuation. Normal gallbladder and biliary tree. Pancreas: No pancreatic ductal  dilatation or surrounding inflammatory changes. Spleen: Normal in size. No concerning splenic lesions. Adrenals/Urinary Tract: Normal adrenal glands. Kidneys are normally located with symmetric enhancement and excretion. No suspicious renal lesion, urolithiasis or hydronephrosis. Urinary bladder is unremarkable. Stomach/Bowel: Distal esophagus, stomach and duodenum are unremarkable. Postsurgical changes from prior partial colectomy with ileocolic anastomosis seen in the midline abdomen. Mild air distention is seen of the distal most segments of small bowel with some fecalization just proximal to the anastomotic line as well as slightly inspissated fecal contents in the colonic remnant. Some mild mural thickening is noted of the proximal colonic remnant as well as the rectosigmoid with faint mucosal hyperenhancement. Some adjacent reactive adenopathy and increased vascularity is present as well. Vascular/Lymphatic: No significant vascular findings are present. No enlarged abdominal or pelvic lymph nodes. Reproductive: Enlarged, heterogeneous fibroid uterus. Multiple intramural, subserosal and submucosal fibroids throughout the uterus result in distortion of the endometrium though some questionable endometrial thickening may be present up to 25 mm which is greater than expected, similar to comparison CT imaging. No concerning adnexal lesions. Other: No abdominopelvic free fluid or free gas. No bowel containing hernias. Mild rectus diastasis. Postsurgical changes from prior vertical midline incision. Musculoskeletal: No acute osseous abnormality or suspicious osseous lesion. Musculature is normal and symmetric. IMPRESSION: 1. Postsurgical changes from prior subtotal colectomy with ileocolic anastomosis seen in the midline abdomen. Mild air distention of the distal most segments of small bowel with some fecalization just proximal to and across the patent anastomotic line. Inspissated fecal contents in the colonic  remnant. Findings could reflect a mild ileus or slowed intestinal transit possibly a secondary finding to the additional inflammatory features of the colonic remnant suspicious for a colitis, favor inflammatory in this patient with history of Crohn's. 2. Enlarged, heterogeneous fibroid uterus. Multiple intramural, subserosal and submucosal fibroids throughout the uterus result in distortion of the endometrium though some questionable endometrial thickening may be present up to 25 mm which is greater than expected, similar to comparison CT imaging. Recommend further evaluation with pelvic ultrasound as clinically indicated. 3. Irregular thickening of the tissues in the right breast lower outer quadrant likely corresponding to the abnormality seen on comparison breast Recommend correlation with recent mammography. Electronically Signed   By: PLovena LeM.D.   On: 06/30/2020 03:08  Scheduled Meds: . Chlorhexidine Gluconate Cloth  6 each Topical Daily  . sodium chloride flush  10-40 mL Intracatheter Q12H   Continuous Infusions: . sodium chloride 100 mL/hr at 07/01/20 0737  . metronidazole 500 mg (07/01/20 1351)     LOS: 1 day    Georgette Shell, MD 07/01/2020, 2:41 PM

## 2020-07-01 NOTE — Anesthesia Preprocedure Evaluation (Signed)
Anesthesia Evaluation  Patient identified by MRN, date of birth, ID band Patient awake    Reviewed: Allergy & Precautions, H&P , NPO status , Patient's Chart, lab work & pertinent test results  Airway Mallampati: I  TM Distance: >3 FB Neck ROM: Full    Dental no notable dental hx. (+) Teeth Intact   Pulmonary neg pulmonary ROS, former smoker,    Pulmonary exam normal breath sounds clear to auscultation       Cardiovascular negative cardio ROS Normal cardiovascular exam Rhythm:Regular Rate:Normal     Neuro/Psych negative neurological ROS  negative psych ROS   GI/Hepatic Neg liver ROS,   Endo/Other  negative endocrine ROS  Renal/GU negative Renal ROS  negative genitourinary   Musculoskeletal negative musculoskeletal ROS (+)   Abdominal Normal abdominal exam  (+)   Peds  Hematology  (+) Blood dyscrasia, anemia ,   Anesthesia Other Findings   Reproductive/Obstetrics negative OB ROS                             Anesthesia Physical  Anesthesia Plan  ASA: II  Anesthesia Plan: MAC   Post-op Pain Management:    Induction:   PONV Risk Score and Plan: 2 and Propofol infusion  Airway Management Planned: Natural Airway, Nasal Cannula and Simple Face Mask  Additional Equipment: None  Intra-op Plan:   Post-operative Plan:   Informed Consent: I have reviewed the patients History and Physical, chart, labs and discussed the procedure including the risks, benefits and alternatives for the proposed anesthesia with the patient or authorized representative who has indicated his/her understanding and acceptance.       Plan Discussed with: CRNA  Anesthesia Plan Comments:         Anesthesia Quick Evaluation

## 2020-07-01 NOTE — Telephone Encounter (Signed)
Scheduled per sch msg. Called, daughter picked up phone. Patient is in the hospital. Daughter said that patients [name] is active. Will check mychart for appt .

## 2020-07-01 NOTE — Interval H&P Note (Signed)
History and Physical Interval Note: Patient states no further abdominal pain. Has had some rectal bleeding with the enema.  I have discussed flex sig with the patient, risks / benefits, she wishes to proceed. Further recommendations pending the results.    07/01/2020 11:33 AM  [name]  has presented today for surgery, with the diagnosis of History of Crohn's, acute colitis.  The various methods of treatment have been discussed with the patient and family. After consideration of risks, benefits and other options for treatment, the patient has consented to  Procedure(s): FLEXIBLE SIGMOIDOSCOPY (N/A) as a surgical intervention.  The patient's history has been reviewed, patient examined, no change in status, stable for surgery.  I have reviewed the patient's chart and labs.  Questions were answered to the patient's satisfaction.     [place]

## 2020-07-01 NOTE — Progress Notes (Incomplete)
Patient ID: [name], female   DOB: 20-Apr-1975, 45 y.o.   MRN: [id number]    Progress Note   Subjective   Day # 2  CC; bloody diarrhea  C. difficile quick screen negative GI path panel pending  WBC down to 2.0/hemoglobin 11.7 hematocrit 36    Objective   Vital signs in last 24 hours: Temp:  [98.2 F (36.8 C)-99 F (37.2 C)] 98.2 F (36.8 C) (03/24 0530) Pulse Rate:  [95-108] 108 (03/24 0530) Resp:  [14-16] 14 (03/24 0530) BP: (119-134)/(81-95) 119/81 (03/24 0530) SpO2:  [97 %-100 %] 98 % (03/24 0530) Last BM Date: 06/30/20 General:    white female in NAD Heart:  Regular rate and rhythm; no murmurs Lungs: Respirations even and unlabored, lungs CTA bilaterally Abdomen:  Soft, nontender and nondistended. Normal bowel sounds. Extremities:  Without edema. Neurologic:  Alert and oriented,  grossly normal neurologically. Psych:  Cooperative. Normal mood and affect.  Intake/Output from previous day: 03/23 0701 - 03/24 0700 In: 360 [I.V.:10; IV Piggyback:350] Out: -  Intake/Output this shift: No intake/output data recorded.  Lab Results: Recent Labs    06/29/20 2352 06/30/20 0727 07/01/20 0516  WBC 10.8* 6.0 2.0*  HGB 12.1 10.9* 11.7*  HCT 38.0 34.1* 36.0  PLT 184 161 163   BMET Recent Labs    06/29/20 2352 06/30/20 0727 07/01/20 0516  NA 139 137 135  K 3.4* 3.7 3.9  CL 104 105 101  CO2 26 24 26   GLUCOSE 106* 92 99  BUN 15 11 10   CREATININE 0.70 0.58 0.81  CALCIUM 8.6* 8.3* 8.9   LFT Recent Labs    06/29/20 2352 06/30/20 0727  PROT 7.5  --   ALBUMIN 3.8 3.4*  AST 23  --   ALT 21  --   ALKPHOS 98  --   BILITOT 0.7  --    PT/INR No results for input(s): LABPROT, INR in the last 72 hours.  Studies/Results: CT ABDOMEN PELVIS W CONTRAST  Result Date: 06/30/2020 CLINICAL DATA:  Abdominal pain, hematochezia, recently began treatment for right breast cancer 06/25/2020, initially diagnosed February 2022 EXAM: CT ABDOMEN AND PELVIS WITH  CONTRAST TECHNIQUE: Multidetector CT imaging of the abdomen and pelvis was performed using the standard protocol following bolus administration of intravenous contrast. CONTRAST:  178m OMNIPAQUE IOHEXOL 300 MG/ML  SOLN COMPARISON:  CT 04/02/2019 FINDINGS: Lower chest: Mild pectus deformity. Lung bases are clear. Normal heart size. No pericardial effusion. Irregular thickening of the tissues in the right breast lower outer quadrant likely corresponding to the abnormality seen on comparison breast MR. Hepatobiliary: No worrisome focal liver lesions. Smooth liver surface contour. Normal hepatic attenuation. Normal gallbladder and biliary tree. Pancreas: No pancreatic ductal dilatation or surrounding inflammatory changes. Spleen: Normal in size. No concerning splenic lesions. Adrenals/Urinary Tract: Normal adrenal glands. Kidneys are normally located with symmetric enhancement and excretion. No suspicious renal lesion, urolithiasis or hydronephrosis. Urinary bladder is unremarkable. Stomach/Bowel: Distal esophagus, stomach and duodenum are unremarkable. Postsurgical changes from prior partial colectomy with ileocolic anastomosis seen in the midline abdomen. Mild air distention is seen of the distal most segments of small bowel with some fecalization just proximal to the anastomotic line as well as slightly inspissated fecal contents in the colonic remnant. Some mild mural thickening is noted of the proximal colonic remnant as well as the rectosigmoid with faint mucosal hyperenhancement. Some adjacent reactive adenopathy and increased vascularity is present as well. Vascular/Lymphatic: No significant vascular findings are present. No enlarged abdominal  or pelvic lymph nodes. Reproductive: Enlarged, heterogeneous fibroid uterus. Multiple intramural, subserosal and submucosal fibroids throughout the uterus result in distortion of the endometrium though some questionable endometrial thickening may be present up to 25 mm  which is greater than expected, similar to comparison CT imaging. No concerning adnexal lesions. Other: No abdominopelvic free fluid or free gas. No bowel containing hernias. Mild rectus diastasis. Postsurgical changes from prior vertical midline incision. Musculoskeletal: No acute osseous abnormality or suspicious osseous lesion. Musculature is normal and symmetric. IMPRESSION: 1. Postsurgical changes from prior subtotal colectomy with ileocolic anastomosis seen in the midline abdomen. Mild air distention of the distal most segments of small bowel with some fecalization just proximal to and across the patent anastomotic line. Inspissated fecal contents in the colonic remnant. Findings could reflect a mild ileus or slowed intestinal transit possibly a secondary finding to the additional inflammatory features of the colonic remnant suspicious for a colitis, favor inflammatory in this patient with history of Crohn's. 2. Enlarged, heterogeneous fibroid uterus. Multiple intramural, subserosal and submucosal fibroids throughout the uterus result in distortion of the endometrium though some questionable endometrial thickening may be present up to 25 mm which is greater than expected, similar to comparison CT imaging. Recommend further evaluation with pelvic ultrasound as clinically indicated. 3. Irregular thickening of the tissues in the right breast lower outer quadrant likely corresponding to the abnormality seen on comparison breast Recommend correlation with recent mammography. Electronically Signed   By: Lovena Le M.D.   On: 06/30/2020 03:08       Assessment / Plan:           Principal Problem:   Colitis with rectal bleeding Active Problems:   Crohn's disease ([place])   Malignant neoplasm of upper-outer quadrant of right breast in female, estrogen receptor positive ([place])   Hypokalemia   Ileus ([place])   Hypocalcemia   Rectal bleed     LOS: 1 day   Amy Esterwood PA-C 07/01/2020, 8:39 AM

## 2020-07-02 ENCOUNTER — Telehealth: Payer: Self-pay | Admitting: Gastroenterology

## 2020-07-02 ENCOUNTER — Encounter (HOSPITAL_COMMUNITY): Payer: Self-pay | Admitting: Gastroenterology

## 2020-07-02 DIAGNOSIS — D709 Neutropenia, unspecified: Secondary | ICD-10-CM

## 2020-07-02 DIAGNOSIS — K625 Hemorrhage of anus and rectum: Secondary | ICD-10-CM | POA: Diagnosis not present

## 2020-07-02 DIAGNOSIS — K529 Noninfective gastroenteritis and colitis, unspecified: Secondary | ICD-10-CM | POA: Diagnosis not present

## 2020-07-02 LAB — CBC WITH DIFFERENTIAL/PLATELET
Abs Immature Granulocytes: 0.1 10*3/uL — ABNORMAL HIGH (ref 0.00–0.07)
Band Neutrophils: 3 %
Basophils Absolute: 0 10*3/uL (ref 0.0–0.1)
Basophils Relative: 0 %
Eosinophils Absolute: 0.1 10*3/uL (ref 0.0–0.5)
Eosinophils Relative: 3 %
HCT: 30.9 % — ABNORMAL LOW (ref 36.0–46.0)
Hemoglobin: 9.9 g/dL — ABNORMAL LOW (ref 12.0–15.0)
Lymphocytes Relative: 41 %
Lymphs Abs: 0.7 10*3/uL (ref 0.7–4.0)
MCH: 27.2 pg (ref 26.0–34.0)
MCHC: 32 g/dL (ref 30.0–36.0)
MCV: 84.9 fL (ref 80.0–100.0)
Metamyelocytes Relative: 5 %
Monocytes Absolute: 0.5 10*3/uL (ref 0.1–1.0)
Monocytes Relative: 31 %
Myelocytes: 2 %
Neutro Abs: 0.3 10*3/uL — CL (ref 1.7–7.7)
Neutrophils Relative %: 15 %
Platelets: 146 10*3/uL — ABNORMAL LOW (ref 150–400)
RBC: 3.64 MIL/uL — ABNORMAL LOW (ref 3.87–5.11)
RDW: 13.6 % (ref 11.5–15.5)
WBC: 1.7 10*3/uL — ABNORMAL LOW (ref 4.0–10.5)
nRBC: 0 % (ref 0.0–0.2)

## 2020-07-02 LAB — SURGICAL PATHOLOGY

## 2020-07-02 MED ORDER — LOPERAMIDE HCL 2 MG PO CAPS: 2.0000 mg | ORAL_CAPSULE | ORAL | 0 refills | Status: DC | PRN

## 2020-07-02 MED ORDER — MESALAMINE 1.2 G PO TBEC: 2.4000 g | DELAYED_RELEASE_TABLET | Freq: Every day | ORAL | 0 refills | Status: DC

## 2020-07-02 MED ORDER — TRAMADOL HCL 50 MG PO TABS: 50.0000 mg | ORAL_TABLET | Freq: Four times a day (QID) | ORAL | 0 refills | Status: DC | PRN

## 2020-07-02 MED ORDER — METRONIDAZOLE 500 MG PO TABS: 500.0000 mg | ORAL_TABLET | Freq: Three times a day (TID) | ORAL | 0 refills | Status: AC

## 2020-07-02 NOTE — Telephone Encounter (Signed)
Pt called stating that her pharmacy told her that they are waiting for her doctor's authorization for Lialda. Not sure if they were referring to PA, pt did not know either but is asking if we can help her with that.

## 2020-07-02 NOTE — Discharge Summary (Signed)
Physician Discharge Summary  [name]:[id number] DOB: Nov 16, 1975 DOA: 06/30/2020  PCP: Patient, [name]  Admit date: 06/30/2020 Discharge date: 07/02/2020  Admitted From: home Disposition: home Recommendations for Outpatient Follow-up:  1. Follow up with PCP in 1-2 weeks 2. Please obtain BMP/CBC in one week 3. Please follow up with Dr. Lindi Adie oncology/Dr. T[place] none Equipment/Devices: None Discharge Condition: Stable CODE STATUS: Full code Diet recommendation: Cardiac  Brief/Interim Summary: [name] a 45 y.o.femalewith medical history significant ofcolon stricture with history of subtotal colectomy, Crohn's disease, diarrhea, iron deficiency anemia, uterine fibroids, vitamin D deficiency, episodes of rectal bleeding who was recently diagnosed with breast cancer, [name] placed by [name] on 06/23/2020, seen by her oncologist [name] on 3/17/2022and begun chemotherapy with carboplatin, docetaxel,pertuzumab and trastuzumab-annson 06/26/2020 after premedication with palonosetron 0.25 mg IVP x1, dexamethasone 10 mg IVPB, fosaprepitant 150 mg IVPB, acetaminophen 650 mg p.o. x1 and diphenhydramine 50 mg p.o. x1. The patient mentionedthatshe did not have any particular symptoms over the weekend, but mentions that since she received the Neulasta she has been having pain in her bones, particularly in her feet. The patient also has had multiple bowel movements with hematochezia associated with colic-like pain in the RLQ. No nausea, vomiting, melena or constipation. No dysuria, frequency or hematuria. She denies she denies fever, but complains of night sweats, chills and mild fatigue. Denies rhinorrhea, sore throat, dyspnea, chest pain, dizziness, orthopnea or lower extremity edema. No polyuria, polydipsia or polyphagia.  ED Course:Initialvital signs were temperature99.1 F, pulse 117, respiration 18, BP 177/97 mmHg O2 sat 97% on  room air. The patient received 650 mg of acetaminophen p.o. and EMLA cream was used topically to assess her Port-A-Cath.  Labwork:Fecal occult blood was positive. Urinalysis showed an elevated specific gravity of more than 1.044 and moderate hemoglobinuria. CBC with a white count of 10.8, hemoglobin 12.1 g/dL and platelets 184. CMP showed potassium of 3.4 mmol/L. Glucose of 106 and calcium 8.6 mg/dL. The rest of the electrolytes, hepatic and renal function are within normal range. Imaging:CT abdomen/pelvis with contrast showed postsurgical changes from previous subtotal colectomy with ileocolic anastomosis seen in the midline abdomen. There is mild air distention of the most distal segments of small bowel with some fecalization just proximal to and across the patent anastomotic line. Inspissated fecal contents and inflammatory changes in the colonic remnant. Findings are suspicious for an ileus. Multiple fibroids with pelvic ultrasound recommended. Please see images and full radiology report for further details.  Discharge Diagnoses:  Principal Problem:   Colitis with rectal bleeding Active Problems:   Crohn's disease ([place])   Malignant neoplasm of upper-outer quadrant of right breast in female, estrogen receptor positive ([place])   Hypokalemia   Ileus ([facility])   Hypocalcemia   Rectal bleed   Neutropenia ([facility])   #1 bloody diarrhea-patient admitted with bloody diarrhea, status post first session of chemotherapy with docetaxel and Neulasta for breast cancer  C. difficile negative Patient has history of Crohn's stricture treated with subtotal colectomy and ileocolonic anastomosis.  She has history of small bowel and colonic Crohn's disease. Flex sig 07/01/2020-diffuse inflammation in the distal rectum biopsied.  [name] discussed with pathologist about the biopsy reports.  Even though there were some background chronic changes with history of Crohn's acute inflammation was likely more  related to drug effect.  Likely representing a drug reaction from docetaxel.  Her symptoms started after getting this infusion previously her Crohn's was in  remission.  And GI felt it is unusual to have Crohn's flare in the setting of chemotherapy. Normal colon otherwise. GI thinks this could be drug reaction from docetaxel though rare. She started on regular diet and tolerated. Due to neutropenia she was not prescribed Canasa emesis.  Instead she was discharged on Flagyl 500 mg 3 times a day with Lialda 2.4 mg a day.  She will follow up with GI and oncology this week.  CBC to be repeated in 2 days to know the progression of neutropenia and hemoglobin.    #2 malignant neoplasm of the right breast upper outer quadrant ER positive-followed by oncology.  [name] to follow in the future for possible mastectomy.  #3 hypokalemia/hypocalcemia resolved  #4 anemia her hemoglobin was 9.9 on discharge this had dropped from 11.7 in 24 hours.  Repeat CBC to be done in 2 days at the GI office.    #5 neutropenia white count 1.7 from 6 at the time of admission likely due to chemotherapy.     Estimated body mass index is 26.57 kg/m as calculated from the following:   Height as of this encounter: 5' 3"  (1.6 m).   Weight as of this encounter: 68 kg.  Discharge Instructions  Discharge Instructions    Diet - low sodium heart healthy   Complete by: As directed    Increase activity slowly   Complete by: As directed    No wound care   Complete by: As directed      Allergies as of 07/02/2020      Reactions   Other    BLOOD PRODUCT REFUSAL      Medication List    STOP taking these medications   Ziextenzo 6 MG/0.6ML injection Generic drug: pegfilgrastim-bmez     TAKE these medications   dexamethasone 4 MG tablet Commonly known as: DECADRON Take 2 tablets (8 mg total) by mouth 2 (two) times daily. Start the day before Taxotere. Then take daily x 3 days after chemotherapy.   ferrous  sulfate 325 (65 FE) MG tablet Take 325 mg by mouth daily with breakfast.   lidocaine-prilocaine cream Commonly known as: EMLA Apply 1 application topically as needed (Prior to port access).   loperamide 2 MG capsule Commonly known as: IMODIUM Take 1 capsule (2 mg total) by mouth as needed for diarrhea or loose stools.   loratadine 10 MG tablet Commonly known as: CLARITIN Take 10 mg by mouth See admin instructions. Take 7 days starting the day of injection   LORazepam 0.5 MG tablet Commonly known as: Ativan Take 1 tablet (0.5 mg total) by mouth every 6 (six) hours as needed (Nausea or vomiting).   mesalamine 1.2 g EC tablet Commonly known as: Lialda Take 2 tablets (2.4 g total) by mouth daily with breakfast.   metroNIDAZOLE 500 MG tablet Commonly known as: Flagyl Take 1 tablet (500 mg total) by mouth 3 (three) times daily for 14 days.   multivitamin tablet Take 1 tablet by mouth daily.   ondansetron 8 MG tablet Commonly known as: Zofran Take 1 tablet (8 mg total) by mouth 2 (two) times daily as needed (Nausea or vomiting). Start on the third day after chemotherapy.   prochlorperazine 10 MG tablet Commonly known as: COMPAZINE Take 1 tablet (10 mg total) by mouth every 6 (six) hours as needed (Nausea or vomiting).   traMADol 50 MG tablet Commonly known as: ULTRAM Take 1 tablet (50 mg total) by mouth every 6 (six) hours as needed  for severe pain.       Follow-up Information    [name], MD Follow up.   Specialty: Hematology and Oncology Contact information: [place] [address] [phone]        Yetta Flock, MD Follow up.   Specialty: Gastroenterology Contact information: [address] [place] [address] [phone]              Allergies  Allergen Reactions  . Other     BLOOD PRODUCT REFUSAL    Consultations:  Fabienne Bruns Adella Nissen   Procedures/Studies: DG Chest 1 View  Result Date:  06/23/2020 CLINICAL DATA:  Port-A-Cath insertion. EXAM: DG C-ARM 1-60 MIN; CHEST  1 VIEW FLUOROSCOPY TIME:  Fluoroscopy Time:  28 seconds. Radiation Exposure Index (if provided by the fluoroscopic device): 2.32 mGy. Number of Acquired Spot Images: 1 COMPARISON:  None. FINDINGS: A single C-arm fluoroscopic image was obtained intraoperatively and submitted for post operative interpretation. This image demonstrates a left IJ approach Port-A-Cath with the tip projecting at the superior cavoatrial junction. No evidence of a kink in the catheter tubing. Please see the performing provider's procedural report for further detail. IMPRESSION: Left Port-A-Cath with tip at the superior cavoatrial junction. Electronically Signed   By: Margaretha Sheffield MD   On: 06/23/2020 12:35   CT ABDOMEN PELVIS W CONTRAST  Result Date: 06/30/2020 CLINICAL DATA:  Abdominal pain, hematochezia, recently began treatment for right breast cancer 06/25/2020, initially diagnosed February 2022 EXAM: CT ABDOMEN AND PELVIS WITH CONTRAST TECHNIQUE: Multidetector CT imaging of the abdomen and pelvis was performed using the standard protocol following bolus administration of intravenous contrast. CONTRAST:  124m OMNIPAQUE IOHEXOL 300 MG/ML  SOLN COMPARISON:  CT 04/02/2019 FINDINGS: Lower chest: Mild pectus deformity. Lung bases are clear. Normal heart size. No pericardial effusion. Irregular thickening of the tissues in the right breast lower outer quadrant likely corresponding to the abnormality seen on comparison breast MR. Hepatobiliary: No worrisome focal liver lesions. Smooth liver surface contour. Normal hepatic attenuation. Normal gallbladder and biliary tree. Pancreas: No pancreatic ductal dilatation or surrounding inflammatory changes. Spleen: Normal in size. No concerning splenic lesions. Adrenals/Urinary Tract: Normal adrenal glands. Kidneys are normally located with symmetric enhancement and excretion. No suspicious renal lesion,  urolithiasis or hydronephrosis. Urinary bladder is unremarkable. Stomach/Bowel: Distal esophagus, stomach and duodenum are unremarkable. Postsurgical changes from prior partial colectomy with ileocolic anastomosis seen in the midline abdomen. Mild air distention is seen of the distal most segments of small bowel with some fecalization just proximal to the anastomotic line as well as slightly inspissated fecal contents in the colonic remnant. Some mild mural thickening is noted of the proximal colonic remnant as well as the rectosigmoid with faint mucosal hyperenhancement. Some adjacent reactive adenopathy and increased vascularity is present as well. Vascular/Lymphatic: No significant vascular findings are present. No enlarged abdominal or pelvic lymph nodes. Reproductive: Enlarged, heterogeneous fibroid uterus. Multiple intramural, subserosal and submucosal fibroids throughout the uterus result in distortion of the endometrium though some questionable endometrial thickening may be present up to 25 mm which is greater than expected, similar to comparison CT imaging. No concerning adnexal lesions. Other: No abdominopelvic free fluid or free gas. No bowel containing hernias. Mild rectus diastasis. Postsurgical changes from prior vertical midline incision. Musculoskeletal: No acute osseous abnormality or suspicious osseous lesion. Musculature is normal and symmetric. IMPRESSION: 1. Postsurgical changes from prior subtotal colectomy with ileocolic anastomosis seen in the midline abdomen. Mild air distention of the distal  most segments of small bowel with some fecalization just proximal to and across the patent anastomotic line. Inspissated fecal contents in the colonic remnant. Findings could reflect a mild ileus or slowed intestinal transit possibly a secondary finding to the additional inflammatory features of the colonic remnant suspicious for a colitis, favor inflammatory in this patient with history of Crohn's. 2.  Enlarged, heterogeneous fibroid uterus. Multiple intramural, subserosal and submucosal fibroids throughout the uterus result in distortion of the endometrium though some questionable endometrial thickening may be present up to 25 mm which is greater than expected, similar to comparison CT imaging. Recommend further evaluation with pelvic ultrasound as clinically indicated. 3. Irregular thickening of the tissues in the right breast lower outer quadrant likely corresponding to the abnormality seen on comparison breast Recommend correlation with recent mammography. Electronically Signed   By: Lovena Le M.D.   On: 06/30/2020 03:08   MR BREAST BILATERAL W WO CONTRAST INC CAD  Result Date: 06/08/2020 CLINICAL DATA:  Patient recently presented with palpable mass in the RIGHT breast. Diagnostic evaluation shows numerous masses in the LATERAL portion of the RIGHT breast. Two of these masses were biopsied, showing grade 3 invasive mammary carcinoma in the 10 o'clock location in the 9 o'clock location of the RIGHT breast. Ultrasound-guided core biopsy of an enlarged RIGHT axillary lymph node shows metastatic carcinoma. LABS:  None obtained at the time of imaging. EXAM: BILATERAL BREAST MRI WITH AND WITHOUT CONTRAST TECHNIQUE: Multiplanar, multisequence MR images of both breasts were obtained prior to and following the intravenous administration of 7 ml of Gadavist Three-dimensional MR images were rendered by post-processing of the original MR data on an independent workstation. The three-dimensional MR images were interpreted, and findings are reported in the following complete MRI report for this study. Three dimensional images were evaluated at the independent interpreting workstation using the DynaCAD thin client. COMPARISON:  Prior studies including 06/07/2020 and earlier FINDINGS: Breast composition: c. Heterogeneous fibroglandular tissue. Background parenchymal enhancement: Moderate. Right breast: There are  numerous contiguous solid rim enhancing masses in the UPPER-OUTER and LOWER-OUTER quadrants of the RIGHT breast, spanning 7.9 x 4.7 x 7.5 centimeters. Enhancement within the larger lesions is rapid in and washout. Marker clips are identified within the central aspect of the RIGHT breast and UPPER OUTER QUADRANT of the RIGHT breast following recent core biopsies. The skin of the RIGHT breast is diffusely thickened and there is mild enhancement of the skin on delayed. There is linear enhancement in the immediate retroareolar region of the RIGHT breast, which extends to the nipple and is suspicious for malignancy of the nipple or retroareolar region. Left breast: Within the LOWER OUTER QUADRANT of the LEFT breast, there is an oval mass with indistinct margins measuring 6 millimeters on image 96 of series 12. Mass demonstrates progressive enhancement. Lymph nodes: The of enlarged RIGHT axillary lymph node is 2.4 centimeters and contains tissue marker clip artifact from recent core biopsy. LEFT axilla is unremarkable. No enlarged internal mammary lymph nodes. Ancillary findings:  None. IMPRESSION: 1. Contiguous masses in the UPPER-OUTER and LOWER-OUTER quadrants of the RIGHT breast, spanning 7.9 centimeters in greatest dimension. 2. Enhancement extends to the RIGHT nipple, suspicious for nipple and/or retroareolar involvement. 3. Significant skin thickening and skin enhancement of the RIGHT breast. 4. Enlarged RIGHT axillary lymph node with tissue marker clip, consistent with known metastatic disease. 5. Single indeterminate 6 millimeter mass in the LOWER OUTER QUADRANT of the LEFT breast warranting tissue diagnosis. Given the small size, ultrasound  correlate is unlikely, and MR guided biopsy is recommended. RECOMMENDATION: 1. Treatment plan for known RIGHT breast malignancy. 2. Consider punch biopsy to evaluate for possible skin involvement, for staging purposes. 3. Recommend MR guided core biopsy of mass in the LOWER  OUTER QUADRANT of the LEFT breast. BI-RADS CATEGORY  4: Suspicious. Electronically Signed   By: Nolon Nations M.D.   On: 06/08/2020 11:30   DG C-Arm 1-60 Min  Result Date: 06/23/2020 CLINICAL DATA:  Port-A-Cath insertion. EXAM: DG C-ARM 1-60 MIN; CHEST  1 VIEW FLUOROSCOPY TIME:  Fluoroscopy Time:  28 seconds. Radiation Exposure Index (if provided by the fluoroscopic device): 2.32 mGy. Number of Acquired Spot Images: 1 COMPARISON:  None. FINDINGS: A single C-arm fluoroscopic image was obtained intraoperatively and submitted for post operative interpretation. This image demonstrates a left IJ approach Port-A-Cath with the tip projecting at the superior cavoatrial junction. No evidence of a kink in the catheter tubing. Please see the performing provider's procedural report for further detail. IMPRESSION: Left Port-A-Cath with tip at the superior cavoatrial junction. Electronically Signed   By: Margaretha Sheffield MD   On: 06/23/2020 12:35   US BREAST LTD UNI RIGHT INC AXILLA  Result Date: 06/02/2020 CLINICAL DATA:  45 year old female complaining of a palpable mass in the right breast. EXAM: DIGITAL DIAGNOSTIC UNILATERAL RIGHT MAMMOGRAM WITH TOMOSYNTHESIS AND CAD; ULTRASOUND RIGHT BREAST LIMITED TECHNIQUE: Right digital diagnostic mammography and breast tomosynthesis was performed. The images were evaluated with computer-aided detection.; Targeted ultrasound examination of the right breast was performed COMPARISON:  Previous exam(s). ACR Breast Density Category c: The breast tissue is heterogeneously dense, which may obscure small masses. FINDINGS: In the upper-outer quadrant of the right breast there is interval development of pleomorphic calcifications spanning 6 cm. The overall parenchymal pattern is diffusely increased throughout the lateral aspect of the right breast with skin thickening. On the lateral view there is a prominent appearing partially imaged axillary lymph node. On physical exam, there is  diffuse firmness through the lateral aspect of the right breast. Targeted ultrasound is performed, showing numerous hypoechoic irregular masses in the lateral aspect of the right breast. There is a hypoechoic mass at 8 o'clock 3 cm from the nipple measuring 1.1 x 1.7 x 0.8 cm. There is a hypoechoic mass in the right breast at 8 o'clock 4 cm from the nipple measuring 1.2 x 0.9 x 0.5 cm. There is a hypoechoic mass in the right breast at 9 o'clock 2 cm from the nipple measuring 2.0 x 1.7 x 2.1 cm. There is a hypoechoic mass in the right breast at 10 o'clock 7 cm from the nipple measuring 1.9 x 1.8 x 1.6 cm. There is a hypoechoic mass in the right breast at 10 o'clock in the retroareolar region measuring 1.2 x 1.0 x 1.0 cm. Sonographic evaluation of the right axilla shows a prominent lymph node with cortical thickening measuring 6 mm. IMPRESSION: Numerous concerning hypoechoic masses in the lateral aspect of the right breast. Enlarged right axillary lymph node. RECOMMENDATION: Ultrasound-guided core biopsy of 2 of the masses in the right breast and the abnormal right axillary lymph node is recommended. I would recommend biopsying the larger mass at 9 o'clock 2 cm from the nipple and 1 of the masses in the 10 o'clock region of the right breast 7 cm from the nipple. I have discussed the findings and recommendations with the patient. If applicable, a reminder letter will be sent to the patient regarding the next appointment. BI-RADS CATEGORY  5: Highly suggestive of malignancy. Electronically Signed   By: Lillia Mountain M.D.   On: 06/02/2020 16:14   MM DIAG BREAST TOMO UNI LEFT  Result Date: 06/07/2020 CLINICAL DATA:  Patient with newly diagnosed right breast carcinoma, for left breast mammography prior to treatment. Patient also has a breast MRI ordered to assess for extent of disease. EXAM: DIGITAL DIAGNOSTIC UNILATERAL LEFT MAMMOGRAM WITH TOMOSYNTHESIS AND CAD TECHNIQUE: Left digital diagnostic mammography and breast  tomosynthesis was performed. The images were evaluated with computer-aided detection. COMPARISON:  Previous exam(s). ACR Breast Density Category c: The breast tissue is heterogeneously dense, which may obscure small masses. FINDINGS: There are no masses, areas of architectural distortion, areas of significant asymmetry or suspicious calcifications. No mammographic change. IMPRESSION: 1. Known right breast malignancy. No evidence of left breast malignancy. RECOMMENDATION: Treatment as planned for the known right breast malignancy. I have discussed the findings and recommendations with the patient. If applicable, a reminder letter will be sent to the patient regarding the next appointment. BI-RADS CATEGORY  6: Known biopsy-proven malignancy. Electronically Signed   By: Lajean Manes M.D.   On: 06/07/2020 08:45   MM DIAG BREAST TOMO UNI RIGHT  Result Date: 06/02/2020 CLINICAL DATA:  45 year old female complaining of a palpable mass in the right breast. EXAM: DIGITAL DIAGNOSTIC UNILATERAL RIGHT MAMMOGRAM WITH TOMOSYNTHESIS AND CAD; ULTRASOUND RIGHT BREAST LIMITED TECHNIQUE: Right digital diagnostic mammography and breast tomosynthesis was performed. The images were evaluated with computer-aided detection.; Targeted ultrasound examination of the right breast was performed COMPARISON:  Previous exam(s). ACR Breast Density Category c: The breast tissue is heterogeneously dense, which may obscure small masses. FINDINGS: In the upper-outer quadrant of the right breast there is interval development of pleomorphic calcifications spanning 6 cm. The overall parenchymal pattern is diffusely increased throughout the lateral aspect of the right breast with skin thickening. On the lateral view there is a prominent appearing partially imaged axillary lymph node. On physical exam, there is diffuse firmness through the lateral aspect of the right breast. Targeted ultrasound is performed, showing numerous hypoechoic irregular masses  in the lateral aspect of the right breast. There is a hypoechoic mass at 8 o'clock 3 cm from the nipple measuring 1.1 x 1.7 x 0.8 cm. There is a hypoechoic mass in the right breast at 8 o'clock 4 cm from the nipple measuring 1.2 x 0.9 x 0.5 cm. There is a hypoechoic mass in the right breast at 9 o'clock 2 cm from the nipple measuring 2.0 x 1.7 x 2.1 cm. There is a hypoechoic mass in the right breast at 10 o'clock 7 cm from the nipple measuring 1.9 x 1.8 x 1.6 cm. There is a hypoechoic mass in the right breast at 10 o'clock in the retroareolar region measuring 1.2 x 1.0 x 1.0 cm. Sonographic evaluation of the right axilla shows a prominent lymph node with cortical thickening measuring 6 mm. IMPRESSION: Numerous concerning hypoechoic masses in the lateral aspect of the right breast. Enlarged right axillary lymph node. RECOMMENDATION: Ultrasound-guided core biopsy of 2 of the masses in the right breast and the abnormal right axillary lymph node is recommended. I would recommend biopsying the larger mass at 9 o'clock 2 cm from the nipple and 1 of the masses in the 10 o'clock region of the right breast 7 cm from the nipple. I have discussed the findings and recommendations with the patient. If applicable, a reminder letter will be sent to the patient regarding the next appointment. BI-RADS CATEGORY  5: Highly suggestive of malignancy. Electronically Signed   By: Lillia Mountain M.D.   On: 06/02/2020 16:14   ECHOCARDIOGRAM COMPLETE  Result Date: 06/16/2020    ECHOCARDIOGRAM REPORT   Patient Name:   AMANII SNETHEN Date of Exam: 06/16/2020 Medical Rec #:  [id number]        Height:       63.0 in Accession #:    [id number]       Weight:       154.9 lb Date of Birth:  [birth year]        BSA:          1.735 m Patient Age:    63 years         BP:           130/86 mmHg Patient Gender: F                HR:           86 bpm. Exam Location:  Outpatient Procedure: 2D Echo, Cardiac Doppler, Color Doppler, Strain Analysis and 3D Echo  Indications:    Z51.11 Encounter for antineoplastic chemotheraphy  History:        Patient has no prior history of Echocardiogram examinations.  Sonographer:    Tiffany Dance Referring Phys: [id number] [place]  1. Left ventricular ejection fraction, by estimation, is 60 to 65%. The left ventricle has normal function. The left ventricle has no regional wall motion abnormalities. Left ventricular diastolic parameters were normal.  2. Right ventricular systolic function is normal. The right ventricular size is normal.  3. The mitral valve is normal in structure. No evidence of mitral valve regurgitation. No evidence of mitral stenosis.  4. The aortic valve is normal in structure. Aortic valve regurgitation is not visualized. No aortic stenosis is present. FINDINGS  Left Ventricle: Left ventricular ejection fraction, by estimation, is 60 to 65%. The left ventricle has normal function. The left ventricle has no regional wall motion abnormalities. The left ventricular internal cavity size was normal in size. There is  no left ventricular hypertrophy. Left ventricular diastolic parameters were normal. Right Ventricle: The right ventricular size is normal. No increase in right ventricular wall thickness. Right ventricular systolic function is normal. Left Atrium: Left atrial size was normal in size. Right Atrium: Right atrial size was normal in size. Pericardium: There is no evidence of pericardial effusion. Mitral Valve: The mitral valve is normal in structure. No evidence of mitral valve regurgitation. No evidence of mitral valve stenosis. Tricuspid Valve: The tricuspid valve is grossly normal. Tricuspid valve regurgitation is not demonstrated. Aortic Valve: The aortic valve is normal in structure. Aortic valve regurgitation is not visualized. No aortic stenosis is present. Pulmonic Valve: The pulmonic valve was normal in structure. Pulmonic valve regurgitation is not visualized. Aorta: The aortic root and  ascending aorta are structurally normal, with no evidence of dilitation. IAS/Shunts: The atrial septum is grossly normal.  LEFT VENTRICLE PLAX 2D LVIDd:         3.80 cm  Diastology LVIDs:         2.60 cm  LV e' medial:    8.54 cm/s LV PW:         0.90 cm  LV E/e' medial:  6.7 LV IVS:        0.90 cm  LV e' lateral:   10.90 cm/s LVOT diam:     1.90 cm  LV E/e' lateral: 5.2 LV SV:  39 LV SV Index:   23 LVOT Area:     2.84 cm                          3D Volume EF:                         3D EF:        53 %                         LV EDV:       106 ml                         LV ESV:       50 ml                         LV SV:        56 ml RIGHT VENTRICLE             IVC RV Basal diam:  2.00 cm     IVC diam: 1.30 cm RV S prime:     12.10 cm/s TAPSE (M-mode): 2.1 cm LEFT ATRIUM             Index       RIGHT ATRIUM          Index LA diam:        3.00 cm 1.73 cm/m  RA Area:     9.25 cm LA Vol (A2C):   18.3 ml 10.55 ml/m RA Volume:   15.70 ml 9.05 ml/m LA Vol (A4C):   20.9 ml 12.05 ml/m LA Biplane Vol: 20.5 ml 11.82 ml/m  AORTIC VALVE LVOT Vmax:   73.70 cm/s LVOT Vmean:  48.400 cm/s LVOT VTI:    0.138 m  AORTA Ao Root diam: 2.70 cm Ao Asc diam:  2.70 cm MITRAL VALVE MV Area (PHT): 4.89 cm    SHUNTS MV Decel Time: 155 msec    Systemic VTI:  0.14 m MV E velocity: 57.20 cm/s  Systemic Diam: 1.90 cm MV A velocity: 56.00 cm/s MV E/A ratio:  1.02 Mertie Moores MD Electronically signed by Mertie Moores MD Signature Date/Time: 06/16/2020/10:26:51 AM    Final    [place] AXILLARY NODE CORE BIOPSY RIGHT  Addendum Date: 06/04/2020   ADDENDUM REPORT: 06/04/2020 15:34 ADDENDUM: 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 This was found to be concordant by Dr. Dorise Bullion. 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 This was found to be concordant by Dr. Dorise Bullion. 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 This was found to be concordant by Dr. Dorise Bullion. Pathology results were discussed with the patient by telephone by Stacie Acres, RN Nurse Navigator. The patient reported doing well after the biopsies with tenderness at the sites. Post biopsy instructions and care were reviewed and questions were answered. The patient was encouraged to call [facility] [place] for any additional concerns. Surgical consultation has been arranged with Dr. [name] at [facility] on June 09, 2020. Medical oncology consultation has been arranged with Dr. Benay Pike at [facility] on June 09, 2020. Recommendation for a bilateral breast MRI to exclude  any additional sites of disease, heterogeneously dense breasts, age and family history. Pathology results reported by Terie Purser, RN on 06/04/2020. Electronically Signed   By: Dorise Bullion III M.D   On: 06/04/2020 15:34   Result Date: 06/04/2020 CLINICAL DATA:  Biopsy of 2 right breast masses and an abnormal right axillary node. EXAM: ULTRASOUND GUIDED RIGHT BREAST CORE NEEDLE BIOPSY COMPARISON:  Previous exam(s). PROCEDURE: I met with the patient and we discussed the procedure of ultrasound-guided biopsy, including benefits and alternatives. We discussed the high likelihood of a successful procedure. We discussed the risks of the procedure, including infection, bleeding, tissue injury, clip migration, and inadequate sampling. Informed written consent was given. The usual time-out protocol was performed immediately prior to the procedure. Lesion quadrant: 9 o'clock, 2 cm from the nipple Using sterile technique and 1% Lidocaine as local anesthetic, under direct ultrasound visualization, a 12 gauge spring-loaded device was used to perform biopsy of a breast mass at 9 o'clock, 2 cm from the nipple in the right breast using a lateral approach. At the conclusion of  the procedure a ribbon shaped tissue marker clip was deployed into the biopsy cavity. Follow up 2 view mammogram was performed and dictated separately. Lesion quadrant: Right breast, 10 o'clock, 7 cm from the nipple Using sterile technique and 1% Lidocaine as local anesthetic, under direct ultrasound visualization, a 14 gauge spring-loaded device was used to perform biopsy of a right breast mass at 10 o'clock, 7 cm from the nipple using a lateral approach. At the conclusion of the procedure a coil shaped tissue marker clip was deployed into the biopsy cavity. Follow up 2 view mammogram was performed and dictated separately. Lesion quadrant: Right axilla Using sterile technique and 1% Lidocaine as local anesthetic, under direct ultrasound visualization, a 14 gauge spring-loaded device was used to perform biopsy of an abnormal right axillary node using a lateral approach. At the conclusion of the procedure a tribell tissue marker clip was deployed into the biopsy cavity. Follow up 2 view mammogram was performed and dictated separately. IMPRESSION: Ultrasound guided biopsy of 2 right breast masses and an abnormal right axillary node. No apparent complications. Electronically Signed: By: Dorise Bullion III M.D On: 06/03/2020 10:43   MM CLIP PLACEMENT LEFT  Result Date: 06/14/2020 CLINICAL DATA:  Evaluate CYLINDER clip following MR guided LEFT breast biopsy. EXAM: 3D DIAGNOSTIC LEFT MAMMOGRAM POST MRI BIOPSY COMPARISON:  Previous exam(s). FINDINGS: 3D Mammographic images were obtained following MR guided biopsy of 0.6 cm OUTER LEFT breast mass. The CYLINDER biopsy marking clip is in expected position at the site of biopsy. IMPRESSION: Appropriate positioning of the CYLINDER shaped biopsy marking clip at the site of biopsy in the OUTER LEFT breast. Final Assessment: Post Procedure Mammograms for Marker Placement Electronically Signed   By: Margarette Canada M.D.   On: 06/14/2020 11:30   MM CLIP PLACEMENT RIGHT  Result  Date: 06/03/2020 CLINICAL DATA:  Evaluate biopsy marker EXAM: DIAGNOSTIC RIGHT MAMMOGRAM POST ULTRASOUND BIOPSY COMPARISON:  Previous exam(s). FINDINGS: Mammographic images were obtained following ultrasound guided biopsies of of 2 right breast masses and a right axillary lymph node. The ribbon shaped clip is within the mass at 9 o'clock, 2 cm from the nipple. The coil shaped clip is within the mass at 10 o'clock, 7 cm from the nipple. These 2 clips are 5 cm apart. The clip placed in the abnormal axillary lymph node could not be visualized on today's mammogram. However, the clip placed within the lymph node  was clearly visualized under ultrasound. IMPRESSION: The ribbon shaped clip is within the mass biopsied at 9 o'clock, 2 cm from the nipple. The coil shaped clip is within the mass biopsied at 10 o'clock, 7 cm from the nipple. Final Assessment: Post Procedure Mammograms for Marker Placement Electronically Signed   By: Dorise Bullion III M.D   On: 06/03/2020 11:15   MR LT BREAST BX W LOC DEV 1ST LESION IMAGE BX [place] MR GUIDE  Addendum Date: 06/15/2020   ADDENDUM REPORT: 06/15/2020 13:18 ADDENDUM: Pathology revealed BENIGN BREAST TISSUE WITH FOCAL FIBROADENOMATOUS CHANGE, NO EVIDENCE OF MALIGNANCY of the LEFT breast. This was found to be concordant by Dr. Hassan Rowan. Pathology results were discussed with the patient by telephone. The patient reported doing well after the biopsy with tenderness at the site. Post biopsy instructions and care were reviewed and questions were answered. The patient was encouraged to call [facility] for any additional concerns. The patient has a recent diagnosis of right breast cancer and should follow her outlined treatment plan. Pathology results reported by Stacie Acres RN on 06/15/2020. Electronically Signed   By: Margarette Canada M.D.   On: 06/15/2020 13:18   Result Date: 06/15/2020 CLINICAL DATA:  45 year old female for tissue sampling of 0.6 cm LOWER OUTER  LEFT breast mass. Recent diagnosis of RIGHT breast cancer. EXAM: MRI GUIDED CORE NEEDLE BIOPSY OF THE LEFT BREAST TECHNIQUE: Multiplanar, multisequence MR imaging of the LEFT breast was performed both before and after administration of intravenous contrast. CONTRAST:  42m GADAVIST GADOBUTROL 1 MMOL/ML IV SOLN COMPARISON:  Previous exams. FINDINGS: I met with the patient, and we discussed the procedure of MRI guided biopsy, including risks, benefits, and alternatives. Specifically, we discussed the risks of infection, bleeding, tissue injury, clip migration, and inadequate sampling. Informed, written consent was given. The usual time out protocol was performed immediately prior to the procedure. Using sterile technique, 1% Lidocaine, MRI guidance, and a 9 gauge vacuum assisted device, biopsy was performed of the 0.6 cm mass in the LOWER OUTER LEFT breast using a LATERAL approach. At the conclusion of the procedure, a CYLINDER tissue marker clip was deployed into the biopsy cavity. Follow-up 2-view mammogram was performed and dictated separately. IMPRESSION: MRI guided biopsy of 0.6 cm LOWER OUTER LEFT breast mass. No apparent complications. Electronically Signed: By: JMargarette CanadaM.D. 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 This was found to be concordant by Dr. 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 This was found to be concordant by Dr. 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 This was found to be concordant by Dr. DDorise Bullion Pathology results were discussed with the patient by telephone by SStacie Acres RN Nurse Navigator. The patient reported  doing well after the biopsies with tenderness at the sites. Post biopsy instructions and care were reviewed and questions were answered. The [facility] [place] [facility]. [facility]. Medical oncology [facility]. Recommendation for a bilateral breast MRI to exclude any additional sites of disease, heterogeneously dense breasts, age and family history. Pathology results reported by Terie Purser, RN on 06/04/2020. Electronically Signed   By: Dorise Bullion III M.D   On: 06/04/2020 15:34   Result Date: 06/04/2020 CLINICAL DATA:  Biopsy of 2 right breast masses and an abnormal right axillary node. EXAM: ULTRASOUND GUIDED RIGHT BREAST CORE NEEDLE BIOPSY COMPARISON:  Previous exam(s). PROCEDURE: I met with the patient and we discussed the procedure of ultrasound-guided biopsy, including benefits and alternatives. We discussed the high likelihood of a successful procedure. We discussed the risks of the procedure, including infection, bleeding, tissue injury, clip migration, and inadequate sampling. Informed written consent was given. The usual time-out protocol was performed immediately prior to the procedure. Lesion quadrant: 9 o'clock, 2 cm from the nipple Using sterile technique and 1% Lidocaine as local anesthetic, under direct ultrasound visualization, a 12 gauge spring-loaded device was used to perform biopsy of a breast mass at 9 o'clock, 2 cm from the nipple in the right breast using a lateral approach. At the conclusion of the procedure a ribbon shaped tissue marker clip was deployed into the biopsy cavity. Follow up 2 view mammogram was performed and dictated separately. Lesion quadrant: Right breast, 10 o'clock, 7 cm from the nipple Using sterile  technique and 1% Lidocaine as local anesthetic, under direct ultrasound visualization, a 14 gauge spring-loaded device was used to perform biopsy of a right breast mass at 10 o'clock, 7 cm from the nipple using a lateral approach. At the conclusion of the procedure a coil shaped tissue marker clip was deployed into the biopsy cavity. Follow up 2 view mammogram was performed and dictated separately. Lesion quadrant: Right axilla Using sterile technique and 1% Lidocaine as local anesthetic, under direct ultrasound visualization, a 14 gauge spring-loaded device was used to perform biopsy of an abnormal right axillary node using a lateral approach. At the conclusion of the procedure a tribell tissue marker clip was deployed into the biopsy cavity. Follow up 2 view mammogram was performed and dictated separately. IMPRESSION: Ultrasound guided biopsy of 2 right breast masses and an abnormal right axillary node. No apparent complications. 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 This was found to be concordant by Dr. Dorise Bullion. 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 This was found to be concordant by Dr. Dorise Bullion. 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 This was found to be concordant by Dr. Dorise Bullion. Pathology results were discussed with the patient by telephone by Stacie Acres, RN Nurse Navigator. The patient reported doing well after the biopsies with tenderness at the sites. Post biopsy instructions and care were reviewed and questions were answered. The patient was encouraged to call [facility] [place] for any additional concerns. Surgical consultation has been arranged with Dr. [name] at [facility] on June 09, 2020. Medical oncology consultation has been arranged with Dr. Benay Pike at [facility] on June 09, 2020. Recommendation for a bilateral breast MRI to exclude any additional sites of disease, heterogeneously  dense breasts, age and family history. Pathology results reported by Terie Purser, RN on 06/04/2020. Electronically Signed   By: Dorise Bullion III M.D   On: 06/04/2020 15:34   Result Date: 06/04/2020 CLINICAL DATA:  Biopsy of 2 right breast masses and an abnormal right axillary node. EXAM: ULTRASOUND GUIDED RIGHT BREAST CORE NEEDLE BIOPSY COMPARISON:  Previous exam(s). PROCEDURE: I met with the patient and we discussed the procedure of ultrasound-guided biopsy, including benefits and alternatives. We discussed the high likelihood of a successful procedure. We discussed the risks of the procedure, including infection, bleeding, tissue injury, clip migration, and inadequate sampling. Informed written consent was given. The usual time-out protocol was performed immediately prior to the procedure. Lesion quadrant: 9 o'clock, 2 cm from the nipple Using sterile technique and 1% Lidocaine as local anesthetic, under direct ultrasound visualization, a 12 gauge spring-loaded device was used to perform biopsy of a breast mass at 9 o'clock, 2 cm from the nipple in the right breast using a lateral approach. At the conclusion of the procedure a ribbon shaped tissue marker clip was deployed into the biopsy cavity. Follow up 2 view mammogram was performed and dictated separately. Lesion quadrant: Right breast, 10 o'clock, 7 cm from the nipple Using sterile technique and 1% Lidocaine as local anesthetic, under direct ultrasound visualization, a 14 gauge spring-loaded device was used to perform biopsy of a right breast mass at 10 o'clock, 7 cm from the  nipple using a lateral approach. At the conclusion of the procedure a coil shaped tissue marker clip was deployed into the biopsy cavity. Follow up 2 view mammogram was performed and dictated separately. Lesion quadrant: Right axilla Using sterile technique and 1% Lidocaine as local anesthetic, under direct ultrasound visualization, a 14 gauge spring-loaded device was used to perform biopsy of an abnormal right axillary node using a lateral approach. At the conclusion of the procedure a tribell tissue marker clip was deployed into the biopsy cavity. Follow up 2 view mammogram was performed and dictated separately. IMPRESSION: Ultrasound guided biopsy of 2 right breast masses and an abnormal right axillary node. No apparent complications. Electronically Signed: By: Dorise Bullion III M.D On: 06/03/2020 10:43    (Echo, Carotid, EGD, Colonoscopy, ERCP)    Subjective:  Resting in bed anxious  Had few BMs overnight saw some fresh blood  Discharge Exam: Vitals:   07/02/20 0551 07/02/20 1302  BP: 116/70 118/90  Pulse: (!) 110 (!) 116  Resp: 20   Temp: 98.9 F (37.2 C)   SpO2: 100% 99%   Vitals:   07/01/20 1220 07/01/20 2211 07/02/20 0551 07/02/20 1302  BP: 115/78 116/83 116/70 118/90  Pulse: 97 (!) 115 (!) 110 (!) 116  Resp: (!) 21 18 20    Temp:  99.4 F (37.4 C) 98.9 F (37.2 C)   TempSrc:  Oral Oral   SpO2: 100% 98% 100% 99%  Weight:      Height:        General: Pt is alert, awake, not in acute distress Cardiovascular: RRR, S1/S2 +, no rubs, no gallops Respiratory: CTA bilaterally, no wheezing, no rhonchi Abdominal: Soft, NT, ND, bowel sounds + Extremities: no edema, no cyanosis    The results of significant diagnostics from this hospitalization (including imaging, microbiology, ancillary and laboratory) are listed below for reference.     Microbiology: Recent Results (from the past 240 hour(s))  SARS CORONAVIRUS 2 (TAT 6-24 HRS) Nasopharyngeal Nasopharyngeal Swab      Status: None   Collection  Time: 06/30/20  3:30 AM   Specimen: Nasopharyngeal Swab  Result Value Ref Range Status   SARS Coronavirus 2 NEGATIVE NEGATIVE Final    Comment: (NOTE) SARS-CoV-2 target nucleic acids are NOT DETECTED.  The SARS-CoV-2 RNA is generally detectable in upper and lower respiratory specimens during the acute phase of infection. Negative results do not preclude SARS-CoV-2 infection, do not rule out co-infections with other pathogens, and should not be used as the sole basis for treatment or other patient management decisions. Negative results must be combined with clinical observations, patient history, and epidemiological information. The expected result is Negative.  Fact Sheet for Patients: SugarRoll.be  Fact Sheet for Healthcare Providers: https://www.woods-mathews.com/  This test is not yet approved or cleared by the [place] FDA and  has been authorized for detection and/or diagnosis of SARS-CoV-2 by FDA under an Emergency Use Authorization (EUA). This EUA will remain  in effect (meaning this test can be used) for the duration of the COVID-19 declaration under Se ction 564(b)(1) of the Act, 21 U.S.C. section 360bbb-3(b)(1), unless the authorization is terminated or revoked sooner.  Performed at [facility], [place] [address], [place], [place] [address]   C Difficile Quick Screen w PCR reflex     Status: None   Collection Time: 06/30/20  5:48 AM   Specimen: STOOL  Result Value Ref Range Status   C Diff antigen NEGATIVE NEGATIVE Final   C Diff toxin NEGATIVE NEGATIVE Final   C Diff interpretation No C. difficile detected.  Final    Comment: Performed at [facility], [place] [address], [place], [place] [address]  Gastrointestinal Panel by PCR , Stool     Status: None   Collection Time: 06/30/20  5:48 AM   Specimen: Stool  Result Value Ref Range Status   Campylobacter species NOT  DETECTED NOT DETECTED Final   Plesimonas shigelloides NOT DETECTED NOT DETECTED Final   Salmonella species NOT DETECTED NOT DETECTED Final   Yersinia enterocolitica NOT DETECTED NOT DETECTED Final   Vibrio species NOT DETECTED NOT DETECTED Final   Vibrio cholerae NOT DETECTED NOT DETECTED Final   Enteroaggregative E coli (EAEC) NOT DETECTED NOT DETECTED Final   Enteropathogenic E coli (EPEC) NOT DETECTED NOT DETECTED Final   Enterotoxigenic E coli (ETEC) NOT DETECTED NOT DETECTED Final   Shiga like toxin producing E coli (STEC) NOT DETECTED NOT DETECTED Final   Shigella/Enteroinvasive E coli (EIEC) NOT DETECTED NOT DETECTED Final   Cryptosporidium NOT DETECTED NOT DETECTED Final   Cyclospora cayetanensis NOT DETECTED NOT DETECTED Final   Entamoeba histolytica NOT DETECTED NOT DETECTED Final   Giardia lamblia NOT DETECTED NOT DETECTED Final   Adenovirus F40/41 NOT DETECTED NOT DETECTED Final   Astrovirus NOT DETECTED NOT DETECTED Final   Norovirus GI/GII NOT DETECTED NOT DETECTED Final   Rotavirus A NOT DETECTED NOT DETECTED Final   Sapovirus (I, II, IV, and V) NOT DETECTED NOT DETECTED Final    Comment: Performed at [facility], [place] [place], [place] [address]     Labs: BNP (last 3 results) No results for input(s): BNP in the last 8760 hours. Basic Metabolic Panel: Recent Labs  Lab 06/29/20 2352 06/30/20 0727 07/01/20 0516  NA 139 137 135  K 3.4* 3.7 3.9  CL 104 105 101  CO2 26 24 26   GLUCOSE 106* 92 99  BUN 15 11 10   CREATININE 0.70 0.58 0.81  CALCIUM 8.6* 8.3* 8.9  MG 2.4  --  2.2  PHOS  --  3.3  --    Liver Function Tests: Recent Labs  Lab 06/29/20 2352 06/30/20 0727  AST 23  --   ALT 21  --   ALKPHOS 98  --   BILITOT 0.7  --   PROT 7.5  --   ALBUMIN 3.8 3.4*   No results for input(s): LIPASE, AMYLASE in the last 168 hours. No results for input(s): AMMONIA in the last 168 hours. CBC: Recent Labs  Lab 06/29/20 2352 06/30/20 0004  06/30/20 0727 07/01/20 0516 07/02/20 1149  WBC 10.8*  --  6.0 2.0* 1.7*  NEUTROABS TEST RESCHEDULED 7.6  --   --  0.3*  HGB 12.1  --  10.9* 11.7* 9.9*  HCT 38.0  --  34.1* 36.0 30.9*  MCV 84.6  --  84.4 83.7 84.9  PLT 184  --  161 163 146*   Cardiac Enzymes: No results for input(s): CKTOTAL, CKMB, CKMBINDEX, TROPONINI in the last 168 hours. BNP: Invalid input(s): POCBNP CBG: No results for input(s): GLUCAP in the last 168 hours. D-Dimer No results for input(s): DDIMER in the last 72 hours. Hgb A1c No results for input(s): HGBA1C in the last 72 hours. Lipid Profile No results for input(s): CHOL, HDL, LDLCALC, TRIG, CHOLHDL, LDLDIRECT in the last 72 hours. Thyroid function studies No results for input(s): TSH, T4TOTAL, T3FREE, THYROIDAB in the last 72 hours.  Invalid input(s): FREET3 Anemia work up No results for input(s): VITAMINB12, FOLATE, FERRITIN, TIBC, IRON, RETICCTPCT in the last 72 hours. Urinalysis    Component Value Date/Time   COLORURINE YELLOW 06/30/2020 0302   APPEARANCEUR CLEAR 06/30/2020 0302   LABSPEC 1.044 (H) 06/30/2020 0302   PHURINE 6.0 06/30/2020 0302   GLUCOSEU NEGATIVE 06/30/2020 0302   HGBUR MODERATE (A) 06/30/2020 0302   BILIRUBINUR NEGATIVE 06/30/2020 0302   KETONESUR NEGATIVE 06/30/2020 0302   PROTEINUR NEGATIVE 06/30/2020 0302   UROBILINOGEN 0.2 06/24/2007 0321   NITRITE NEGATIVE 06/30/2020 0302   LEUKOCYTESUR NEGATIVE 06/30/2020 0302   Sepsis Labs Invalid input(s): PROCALCITONIN,  WBC,  LACTICIDVEN Microbiology Recent Results (from the past 240 hour(s))  SARS CORONAVIRUS 2 (TAT 6-24 HRS) Nasopharyngeal Nasopharyngeal Swab     Status: None   Collection Time: 06/30/20  3:30 AM   Specimen: Nasopharyngeal Swab  Result Value Ref Range Status   SARS Coronavirus 2 NEGATIVE NEGATIVE Final    Comment: (NOTE) SARS-CoV-2 target nucleic acids are NOT DETECTED.  The SARS-CoV-2 RNA is generally detectable in upper and lower respiratory specimens  during the acute phase of infection. Negative results do not preclude SARS-CoV-2 infection, do not rule out co-infections with other pathogens, and should not be used as the sole basis for treatment or other patient management decisions. Negative results must be combined with clinical observations, patient history, and epidemiological information. The expected result is Negative.  Fact Sheet for Patients: SugarRoll.be  Fact Sheet for Healthcare Providers: https://www.woods-mathews.com/  This test is not yet approved or cleared by the [place] FDA and  has been authorized for detection and/or diagnosis of SARS-CoV-2 by FDA under an Emergency Use Authorization (EUA). This EUA will remain  in effect (meaning this test can be used) for the duration of the COVID-19 declaration under Se ction 564(b)(1) of the Act, 21 U.S.C. section 360bbb-3(b)(1), unless the authorization is terminated or revoked sooner.  Performed at [facility], [place] [address], [place], [place] [address]   C Difficile Quick Screen w PCR reflex     Status:  None   Collection Time: 06/30/20  5:48 AM   Specimen: STOOL  Result Value Ref Range Status   C Diff antigen NEGATIVE NEGATIVE Final   C Diff toxin NEGATIVE NEGATIVE Final   C Diff interpretation No C. difficile detected.  Final    Comment: Performed at [facility], [place] [address] [place], [place] [address]  Gastrointestinal Panel by PCR , Stool     Status: None   Collection Time: 06/30/20  5:48 AM   Specimen: Stool  Result Value Ref Range Status   Campylobacter species NOT DETECTED NOT DETECTED Final   Plesimonas shigelloides NOT DETECTED NOT DETECTED Final   Salmonella species NOT DETECTED NOT DETECTED Final   Yersinia enterocolitica NOT DETECTED NOT DETECTED Final   Vibrio species NOT DETECTED NOT DETECTED Final   Vibrio cholerae NOT DETECTED NOT DETECTED Final   Enteroaggregative E  coli (EAEC) NOT DETECTED NOT DETECTED Final   Enteropathogenic E coli (EPEC) NOT DETECTED NOT DETECTED Final   Enterotoxigenic E coli (ETEC) NOT DETECTED NOT DETECTED Final   Shiga like toxin producing E coli (STEC) NOT DETECTED NOT DETECTED Final   Shigella/Enteroinvasive E coli (EIEC) NOT DETECTED NOT DETECTED Final   Cryptosporidium NOT DETECTED NOT DETECTED Final   Cyclospora cayetanensis NOT DETECTED NOT DETECTED Final   Entamoeba histolytica NOT DETECTED NOT DETECTED Final   Giardia lamblia NOT DETECTED NOT DETECTED Final   Adenovirus F40/41 NOT DETECTED NOT DETECTED Final   Astrovirus NOT DETECTED NOT DETECTED Final   Norovirus GI/GII NOT DETECTED NOT DETECTED Final   Rotavirus A NOT DETECTED NOT DETECTED Final   Sapovirus (I, II, IV, and V) NOT DETECTED NOT DETECTED Final    Comment: Performed at [facility], [address], [place], [place] [address]     Time coordinating discharge:  39 minutes  SIGNED:   Georgette Shell, MD  [facility] 07/02/2020, 2:15 PM

## 2020-07-02 NOTE — Progress Notes (Signed)
Patient not seen today, attempted to visit her yesterday She was being wheeled to flex sig at that time. Daughter mentioned patient is worried about continuing current therapy and would like to see another oncologist to make sure that this is an appropriate treatment. This is reasonable, I have made arrangement for her care to be transferred to Dr [name]. I also discussed with Dr Havery Moros, and he believes this is less likely crohn's disease. We agreed that its reasonable to try abx with flagyl. I have already reduced the dose of chemotherapy for C2, but this may be modified by Dr [name] depending on his assessment. She already had neulasta after her chemotherapy Ok to use short term pain medication, do not prefer NSAID in her case given rectal bleeding for management of arthralgias from neulasta.

## 2020-07-02 NOTE — Plan of Care (Signed)
  Problem: Education: Goal: Knowledge of General Education information will improve Description: Including pain rating scale, medication(s)/side effects and non-pharmacologic comfort measures Outcome: Adequate for Discharge   Problem: Health Behavior/Discharge Planning: Goal: Ability to manage health-related needs will improve Outcome: Adequate for Discharge   Problem: Clinical Measurements: Goal: Ability to maintain clinical measurements within normal limits will improve Outcome: Adequate for Discharge Goal: Will remain free from infection Outcome: Adequate for Discharge Goal: Diagnostic test results will improve Outcome: Adequate for Discharge Goal: Respiratory complications will improve Outcome: Adequate for Discharge Goal: Cardiovascular complication will be avoided Outcome: Adequate for Discharge   Problem: Activity: Goal: Risk for activity intolerance will decrease Outcome: Adequate for Discharge   Problem: Nutrition: Goal: Adequate nutrition will be maintained Outcome: Adequate for Discharge   Problem: Coping: Goal: Level of anxiety will decrease Outcome: Adequate for Discharge   Problem: Elimination: Goal: Will not experience complications related to bowel motility Outcome: Adequate for Discharge Goal: Will not experience complications related to urinary retention Outcome: Adequate for Discharge   Problem: Pain Managment: Goal: General experience of comfort will improve Description: Patient's general experience of comfort will improve by 07/03/20 Outcome: Adequate for Discharge   Problem: Safety: Goal: Ability to remain free from injury will improve Outcome: Adequate for Discharge   Problem: Skin Integrity: Goal: Risk for impaired skin integrity will decrease Outcome: Adequate for Discharge

## 2020-07-02 NOTE — Progress Notes (Signed)
Progress Note   Subjective  Patient states she is feeling better.  She has no abdominal pain.  She is having some intermittent diarrhea and small amount of rectal bleeding.  No fevers.  White blood cell count slightly lower than yesterday.   Objective   Vital signs in last 24 hours: Temp:  [98.9 F (37.2 C)-99.4 F (37.4 C)] 98.9 F (37.2 C) (03/25 0551) Pulse Rate:  [110-116] 116 (03/25 1302) Resp:  [18-20] 20 (03/25 0551) BP: (116-118)/(70-90) 118/90 (03/25 1302) SpO2:  [98 %-100 %] 99 % (03/25 1302) Last BM Date: 07/02/20 General:    AA female in NAD Abdomen:  Soft, nontender and nondistended.  Intake/Output from previous day: 03/24 0701 - 03/25 0700 In: 1193 [P.O.:240; I.V.:853; IV Piggyback:100] Out: -  Intake/Output this shift: No intake/output data recorded.  Lab Results: Recent Labs    06/30/20 0727 07/01/20 0516 07/02/20 1149  WBC 6.0 2.0* 1.7*  HGB 10.9* 11.7* 9.9*  HCT 34.1* 36.0 30.9*  PLT 161 163 146*   BMET Recent Labs    06/29/20 2352 06/30/20 0727 07/01/20 0516  NA 139 137 135  K 3.4* 3.7 3.9  CL 104 105 101  CO2 26 24 26   GLUCOSE 106* 92 99  BUN 15 11 10   CREATININE 0.70 0.58 0.81  CALCIUM 8.6* 8.3* 8.9   LFT Recent Labs    06/29/20 2352 06/30/20 0727  PROT 7.5  --   ALBUMIN 3.8 3.4*  AST 23  --   ALT 21  --   ALKPHOS 98  --   BILITOT 0.7  --    PT/INR No results for input(s): LABPROT, INR in the last 72 hours.  Studies/Results: No results found.     Assessment / Plan:    45 year old female with a history of Crohn's disease status post partial colectomy with ileosigmoid anastomosis, recently diagnosed breast cancer on chemotherapy with regimen including docetaxel, presented with bloody diarrhea.  Stool studies for infection negative.  I performed a flex sig yesterday which showed no inflammation in the ileum, surgical anastomosis looked good without inflammation.  She had inflammatory changes of the distal 10 cm of  her rectum consistent with proctitis, the rest of her remnant colon looked normal.  Biopsies were taken.  I spoke with pathology about the results today.  On biopsies there is evidence of history of Crohn's with some background chronicity changes however acute inflammation they suspect is more likely drug effect to be causing this.  I discussed this with the patient.  I do think that the findings most likely represent drug reaction from docetaxel.  Her symptoms started after getting this infused, previously her Crohn's is in remission, and it is unusual to have a Crohn's flare in the setting of chemotherapy.  We discussed how to treat this.  Unfortunately her white blood cell count is going down and becoming neutropenic.  I would normally consider a trial of Canasa, or short-chain fatty acid enemas for this.  Given her low white blood cell count I would avoid rectal therapies for now.  I discussed other options.  I reviewed some of the literature on docetaxel induced colitis, there are case reports of using Flagyl for this.  Given her neutropenia will keep her on Flagyl 500 mg 3 times daily.  I also discussed empiric Lialda dosed at 2.4 g a day to see if that will help at all, in case there is a component of IBD to this, or perhaps  this may also help drug-related colitis.  She was agreeable with this.  She strongly wishes to go home which I think is reasonable if she is otherwise stable, her symptoms appear improved from previous.  Hopefully this will improve with time.  The question moving forward is which chemotherapy regimen is best for her in light of this occurrence, I would avoid docetaxel.  She is set to see oncology on Wednesday to discuss her options.  In the interim we will place her on Flagyl 3 times daily and Lialda.  We will repeat CBC on Monday to see where her neutropenia is heading and if this is improved we may switch to Canasa or other rectal therapy pending her course.  I had a lengthy  discussion with her about all of this and she understands.  She is quite frustrated with this occurrence has a lot of questions about plans moving forward.  Again hopefully with time, if related to [facility], this should get better.  Plan: - patient symptomatically better, I think okay to be discharged today - plan on discharging with Flagyl 500 mg PO 3 times daily as well as Lialda 2.4 gm / day - CBC with diff on Monday - we will call to check on her on Monday.  If symptoms are persisting or worsening will consider Canasa or short-chain fatty acid enemas if she is no longer neutropenic.  She has had Neupogen already - Oncology follow-up on Wednesday to determine best regimen for chemotherapy at this point given this occurrence - if she for some reason worsens over the weekend she should call after hours line for advice  Call with questions, we will sign off for now.   Cellar, MD [facility]

## 2020-07-05 ENCOUNTER — Encounter: Payer: Self-pay | Admitting: *Deleted

## 2020-07-05 LAB — PATHOLOGIST SMEAR REVIEW

## 2020-07-05 NOTE — Telephone Encounter (Signed)
Called and spoke to pharmacy. Patient has already picked up Lialda script.

## 2020-07-06 ENCOUNTER — Other Ambulatory Visit (INDEPENDENT_AMBULATORY_CARE_PROVIDER_SITE_OTHER): Payer: Managed Care, Other (non HMO)

## 2020-07-06 ENCOUNTER — Other Ambulatory Visit: Payer: Self-pay

## 2020-07-06 ENCOUNTER — Telehealth: Payer: Self-pay

## 2020-07-06 ENCOUNTER — Encounter: Payer: Self-pay | Admitting: Hematology and Oncology

## 2020-07-06 DIAGNOSIS — D509 Iron deficiency anemia, unspecified: Secondary | ICD-10-CM

## 2020-07-06 DIAGNOSIS — D500 Iron deficiency anemia secondary to blood loss (chronic): Secondary | ICD-10-CM | POA: Diagnosis not present

## 2020-07-06 DIAGNOSIS — K50819 Crohn's disease of both small and large intestine with unspecified complications: Secondary | ICD-10-CM | POA: Diagnosis not present

## 2020-07-06 NOTE — Assessment & Plan Note (Signed)
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 palpable right breast mass measuring about 7 cm x 5 and half centimeters, palpable right axillary lymph node with concern for skin invasion   Treatment Plan: 1. Neoadjuvant chemo with TCHP q 3 weeks X 6 followed by HP vs Kadcyla 2. Mastectomy with TAD 3. Adj XRT 4. Adj Anti-estrogen therapy 5. Neratinib --------------------------------------------------------------------------------------------------------- Current Treatment : neoadj TCHP started 06/25/20 Chemo Toxicities: 1. Bloody Diarrhea 2. Hospitalization 3/23-3/25/22: Sub total colectomy, Crohns disease, Iron deficiency Anemia 3. Bone Pain   Iron Deficiency Anemia

## 2020-07-06 NOTE — Progress Notes (Signed)
Received [employer] approval for [employer].   Patient approved for up to $10,000 leaving her with a $0 copay after insurance pays 06/23/20 - 04/09/21 with eligible claim date of 02/24/20.  Unsure whom applied on behalf of patient and [employer] didn't have a name listed either .  Copy will be given to [facility] for billing/claim submissions.

## 2020-07-06 NOTE — Telephone Encounter (Signed)
Called and spoke to patient.  She is feeling a little better but she still has loose stool.  She will go to the lab today for CBC.

## 2020-07-06 NOTE — Progress Notes (Unsigned)
Order entered for cbc

## 2020-07-06 NOTE — Progress Notes (Addendum)
[name] Care Team: [name], [name] (Inactive) as PCP - General (General Practice) [name], RN as Oncology Nurse Navigator [name], RN as Oncology Nurse Navigator  DIAGNOSIS:    ICD-10-CM   1. 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 palpable right breast mass measuring about 7 cm x 5 and half centimeters, palpable right axillary lymph node with concern for skin invasion    06/24/2020 Genetic Testing   No pathogenic variants detected in [facility] [employer] +RNAinsight.  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  The report date is June 24, 2020.   The CustomNext-Cancer+RNAinsight panel offered by [facility] includes sequencing and rearrangement analysis for the following 47 genes:  APC, ATM, AXIN2, BARD1, BMPR1A, BRCA1, BRCA2, BRIP1, CDH1, CDK4, CDKN2A, CHEK2, DICER1, EPCAM, GREM1, HOXB13, MEN1, MLH1, MSH2, MSH3, MSH6, MUTYH, NBN, NF1, NF2, NTHL1, PALB2, PMS2, POLD1, POLE, PTEN, RAD51C, RAD51D, RECQL, RET, SDHA, SDHAF2, SDHB, SDHC, SDHD, SMAD4, SMARCA4, STK11, TP53, TSC1, TSC2, and VHL.  RNA data is routinely analyzed for use in variant interpretation for all genes.   06/25/2020 -  Chemotherapy    [name] is on Treatment Plan: BREAST  DOCETAXEL + CARBOPLATIN + TRASTUZUMAB + PERTUZUMAB  (TCHP) Q21D         CHIEF COMPLIANT: Evaluation of right breast cancer for second opinion  INTERVAL HISTORY: RISHA BARRETTA  is a 45 y.o. with above-mentioned history of HER-2 positive right breast cancer currently on neoadjuvant chemotherapy with [place]. She received cycle 1 on 06/25/20. On 06/30/20 she presented to the ED for rectal bleeding and abdominal pain. She has a history of Crohn's disease. She is a [name] of [name] and requested a second opinion on her treatment plan. She presents to the clinic today for evaluation.  After her first cycle of TCHP she had profound side effects that included severe diarrhea accompanied by bloody stool.  She was in the hospital as result of that.  Her gastroenterologist suggested that the symptoms are related to Taxotere.  She is reluctant to receive any more Taxotere because of the way she feels.  She also experienced fatigue and bone pain.  She lost about 10 pounds weight overall.  ALLERGIES:  is allergic to other.  MEDICATIONS:  Current Outpatient Medications  Medication Sig Dispense Refill  . dexamethasone (DECADRON) 4 MG tablet Take 2 tablets (8 mg total) by mouth 2 (two) times daily. Start the day before Taxotere. Then take daily x 3 days after chemotherapy. 30 tablet 1  . ferrous sulfate 325 (65 FE) MG tablet Take 325 mg by mouth daily with breakfast.    . lidocaine-prilocaine (EMLA) cream Apply 1 application topically as needed (Prior to port access). 30 g 2  . loperamide (IMODIUM) 2 MG capsule Take 1 capsule (2 mg total) by mouth as needed for diarrhea or loose stools. 30 capsule 0  . loratadine (CLARITIN) 10 MG tablet Take 10 mg by mouth  See admin instructions. Take 7 days starting the day of injection    . LORazepam (ATIVAN) 0.5 MG tablet Take 1 tablet (0.5 mg total) by mouth every 6 (six) hours as needed (Nausea or vomiting). 30 tablet 0  . mesalamine (LIALDA) 1.2 g EC tablet Take 2 tablets (2.4 g total) by mouth daily with breakfast. 60 tablet 0  . metroNIDAZOLE (FLAGYL) 500 MG tablet Take 1 tablet (500 mg total) by mouth 3 (three) times daily for 14 days. 42  tablet 0  . Multiple Vitamin (MULTIVITAMIN) tablet Take 1 tablet by mouth daily.    . ondansetron (ZOFRAN) 8 MG tablet Take 1 tablet (8 mg total) by mouth 2 (two) times daily as needed (Nausea or vomiting). Start on the third day after chemotherapy. 30 tablet 1  . prochlorperazine (COMPAZINE) 10 MG tablet Take 1 tablet (10 mg total) by mouth every 6 (six) hours as needed (Nausea or vomiting). 30 tablet 1  . traMADol (ULTRAM) 50 MG tablet Take 1 tablet (50 mg total) by mouth every 6 (six) hours as needed for severe pain. 30 tablet 0   No current facility-administered medications for this visit.    PHYSICAL EXAMINATION: ECOG PERFORMANCE STATUS: 2 - Symptomatic, <50% confined to bed  There were no vitals filed for this visit. There were no vitals filed for this visit.   LABORATORY DATA:  I have reviewed the data as listed CMP Latest Ref Rng & Units 07/01/2020 06/30/2020 06/29/2020  Glucose 70 - 99 mg/dL 99 92 106(H)  BUN 6 - 20 mg/dL _0 Creatinine 0.44 - 1.00 mg/dL 0.81 0.58 0.70  Sodium 135 - 145 mmol/L 135 137 139  Potassium 3.5 - 5.1 mmol/L 3.9 3.7 3.4(L)  Chloride 98 - 111 mmol/L 101 105 104  CO2 22 - 32 mmol/L _1 Calcium 8.9 - 10.3 mg/dL 8.9 8.3(L) 8.6(L)  Total Protein 6.5 - 8.1 g/dL - - 7.5  Total Bilirubin 0.3 - 1.2 mg/dL - - 0.7  Alkaline Phos 38 - 126 U/L - - 98  AST 15 - 41 U/L - - 23  ALT 0 - 44 U/L - - 21    Lab Results  Component Value Date   WBC 13.6 (H) 07/06/2020   HGB 11.7 (L) 07/06/2020   HCT 36.0 07/06/2020   MCV 82.4 07/06/2020   PLT 231.0 07/06/2020   NEUTROABS 11.7 (H) 07/06/2020    ASSESSMENT & PLAN:  Malignant neoplasm of upper-outer quadrant of right breast in female, estrogen receptor positive ([place]) 06/03/2020, US guided biopsy of the right breast 9 0 clock mass showed grade III IDC; Prognostics showed ER 40% positive, weak staining, PR positive 0 %, Her 2 3 +, Ki 67 30% 4dCN1M0 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 palpable  right breast mass measuring about 7 cm x 5 and half centimeters, palpable right axillary lymph node with concern for skin invasion   Treatment Plan: 1. Neoadjuvant chemo with TCHP q 3 weeks X 6 followed by HP vs Kadcyla 2. Mastectomy with TAD 3. Adj XRT 4. Adj Anti-estrogen therapy 5. Neratinib --------------------------------------------------------------------------------------------------------- Current Treatment : neoadj TCHP started 06/25/20 Chemo Toxicities: 1. Bloody Diarrhea: Still ongoing issue 2. Hospitalization 3/23-3/25/22: History of Sub total colectomy, Crohns disease, Iron deficiency Anemia 3. Bone Pain We had lengthy discussion about the diagnosis and the treatment plan.  I discussed with her that she received what we considered standard of care treatment which would have been my treatment plan as well.  However given the severity of her symptoms and her personal wishes, our plan is to eliminate Perjeta and Taxotere for the next treatment.  If the diarrhea and the hematochezia improve then we will consider adding back the Taxotere at a reduced dosage of 50 mg/m with cycle 3. She is receiving additional treatments for Crohn's disease including mesalamine to stop the bleeding and inflammation.  Iron Deficiency Anemia: Hemoglobin has improved to 11.7 on 07/06/2020. We will add iron studies to the next set of labs to see if she would benefit from intravenous iron therapy. I removed the injection appointment since she will not need it without the Taxotere.  No orders of the defined types were placed in this encounter.  The [name] has a good understanding of the overall plan. she agrees with it. she will call with any problems that may develop before the next visit here.  Total time spent: 45 mins including face to face time and time spent for planning, charting and coordination of care  Rulon Eisenmenger, MD, MPH 07/07/2020  I, Molly Dorshimer, am acting as scribe for Dr. [name].  I have reviewed the above documentation for accuracy and completeness, and I agree with the above.

## 2020-07-06 NOTE — Telephone Encounter (Signed)
-----   Message from Yetta Flock, MD sent at 07/02/2020  1:30 PM EDT ----- Regarding: outpatient follow up Barbera Setters can you help place an order for CBC with diff Monday and call and check up on this patient Monday? She is being discharged today.  Glendell Docker see my note for this patient. Very complicated. 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 It is only rectal involvement but can't use rectal therapies as she is now neutropenic. I have her on flagyl (case reports of using this for docetaxil colitis) and empirically giving Lialda. If her neutropenia improves would consider Canasa or short chain fatty acid enemas. Will see how she does.   Thanks Richardson Landry

## 2020-07-06 NOTE — Telephone Encounter (Signed)
Patient notified to come for labs.  She will come today or tomorrow.  She reports urgency with BM after meals.  Bleeding has stopped.

## 2020-07-06 NOTE — Telephone Encounter (Signed)
Thanks [name] was only for 30 day supply. She should be on flagyl and Lialda right now. She was due for CBC yesterday and don't see that she had it done. Can you ask her to go to the lab for CBC with diff today. Hopefully she is feeling better with the medications. Thanks

## 2020-07-06 NOTE — Telephone Encounter (Signed)
Okay thanks for your help with this

## 2020-07-07 ENCOUNTER — Other Ambulatory Visit: Payer: Self-pay

## 2020-07-07 ENCOUNTER — Ambulatory Visit: Payer: Managed Care, Other (non HMO) | Admitting: Hematology and Oncology

## 2020-07-07 ENCOUNTER — Encounter: Payer: Self-pay | Admitting: *Deleted

## 2020-07-07 ENCOUNTER — Telehealth: Payer: Self-pay | Admitting: Internal Medicine

## 2020-07-07 ENCOUNTER — Other Ambulatory Visit: Payer: Self-pay | Admitting: Hematology and Oncology

## 2020-07-07 ENCOUNTER — Inpatient Hospital Stay: Payer: Managed Care, Other (non HMO) | Admitting: Hematology and Oncology

## 2020-07-07 VITALS — BP 142/99 | HR 119 | Temp 97.9°F | Resp 21 | Ht 63.0 in | Wt 142.2 lb

## 2020-07-07 DIAGNOSIS — N920 Excessive and frequent menstruation with regular cycle: Secondary | ICD-10-CM | POA: Diagnosis not present

## 2020-07-07 DIAGNOSIS — K5090 Crohn's disease, unspecified, without complications: Secondary | ICD-10-CM | POA: Diagnosis not present

## 2020-07-07 DIAGNOSIS — C50411 Malignant neoplasm of upper-outer quadrant of right female breast: Secondary | ICD-10-CM

## 2020-07-07 DIAGNOSIS — D500 Iron deficiency anemia secondary to blood loss (chronic): Secondary | ICD-10-CM | POA: Diagnosis not present

## 2020-07-07 DIAGNOSIS — Z170 Estrogen receptor positive status [ER+]: Secondary | ICD-10-CM

## 2020-07-07 DIAGNOSIS — C50811 Malignant neoplasm of overlapping sites of right female breast: Secondary | ICD-10-CM | POA: Diagnosis present

## 2020-07-07 DIAGNOSIS — Z5112 Encounter for antineoplastic immunotherapy: Secondary | ICD-10-CM | POA: Diagnosis present

## 2020-07-07 DIAGNOSIS — Z5111 Encounter for antineoplastic chemotherapy: Secondary | ICD-10-CM | POA: Diagnosis present

## 2020-07-07 LAB — CBC WITH DIFFERENTIAL/PLATELET
Basophils Absolute: 0 10*3/uL (ref 0.0–0.1)
Basophils Relative: 0.1 % (ref 0.0–3.0)
Eosinophils Absolute: 0 10*3/uL (ref 0.0–0.7)
Eosinophils Relative: 0 % (ref 0.0–5.0)
HCT: 36 % (ref 36.0–46.0)
Hemoglobin: 11.7 g/dL — ABNORMAL LOW (ref 12.0–15.0)
Lymphocytes Relative: 9 % — ABNORMAL LOW (ref 12.0–46.0)
Lymphs Abs: 1.2 10*3/uL (ref 0.7–4.0)
MCHC: 32.6 g/dL (ref 30.0–36.0)
MCV: 82.4 fl (ref 78.0–100.0)
Monocytes Absolute: 0.7 10*3/uL (ref 0.1–1.0)
Monocytes Relative: 5.3 % (ref 3.0–12.0)
Neutro Abs: 11.7 10*3/uL — ABNORMAL HIGH (ref 1.4–7.7)
Neutrophils Relative %: 85.6 % — ABNORMAL HIGH (ref 43.0–77.0)
Platelets: 231 10*3/uL (ref 150.0–400.0)
RBC: 4.36 Mil/uL (ref 3.87–5.11)
RDW: 14.5 % (ref 11.5–15.5)
WBC: 13.6 10*3/uL — ABNORMAL HIGH (ref 4.0–10.5)

## 2020-07-07 MED ORDER — DIPHENOXYLATE-ATROPINE 2.5-0.025 MG PO TABS: ORAL_TABLET | ORAL | 1 refills | Status: DC

## 2020-07-07 MED ORDER — MESALAMINE 1.2 G PO TBEC: 2.4000 g | DELAYED_RELEASE_TABLET | Freq: Two times a day (BID) | ORAL | 2 refills | Status: DC

## 2020-07-07 NOTE — Telephone Encounter (Signed)
Left message for patient to call back  

## 2020-07-07 NOTE — Progress Notes (Signed)
Per MD pt will not need home Ziextenzo injection following tx on 07/16/20 due to eliminating Taxotere for that tx date.  States pt will be due next for Ziextenzo following tx on 08/06/20.  RN attempt x1 to contact pt case manager [name] with [facility] at [phone] ext [phone].  No answer, LVM to return call to the office.

## 2020-07-07 NOTE — Addendum Note (Signed)
Addended by: [name] on: 07/07/2020 10:20 AM   Modules accepted: Orders

## 2020-07-07 NOTE — Telephone Encounter (Signed)
I called the patient and let her know that her hemoglobin was 11.7.  Her white count is up above normal a little bit.  She saw Dr. [name] yesterday regarding her breast cancer treatment and his plan is to hold some of the therapy on her next treatment cycle and then added back at reduced dose.  We have had some question as to what caused this colitis the biopsies definitely show chronic colitis so I think it is Crohn's disease plus or minus the chemotherapy.  She is still having some rectal bleeding and definitely having urgent loose stools postprandial  Loperamide is not helping  My plan is as follows:  Increase Lialda to 2 tabs twice a day I sent a new prescription  Use generic Lomotil 1 perhaps 2 tabs prior to meals to treat symptoms of urgent defecation.  I have prescribed this.  I think the oncology plan is reasonable.  I would lean towards using the chemotherapy at a reduced dose as planned even if her diarrhea does not recur with her next treatment without it.  I think this is a multifactorial problem and that her Crohn's disease is at least a part of it.   Hold steroids like budesonide in reserve if she feels like she is improving.   She needs an appointment to see me in about 2 or 3 weeks, she knows to check in through my chart otherwise if needed.  We can use a working slot or a banding slot to see her.

## 2020-07-08 ENCOUNTER — Encounter: Payer: Self-pay | Admitting: Hematology and Oncology

## 2020-07-08 ENCOUNTER — Other Ambulatory Visit (HOSPITAL_COMMUNITY): Payer: Managed Care, Other (non HMO)

## 2020-07-09 ENCOUNTER — Ambulatory Visit (HOSPITAL_COMMUNITY)
Admission: RE | Admit: 2020-07-09 | Discharge: 2020-07-09 | Disposition: A | Discharge status: Home / Self Care | Payer: Managed Care, Other (non HMO) | Source: Ambulatory Visit | Attending: Hematology and Oncology | Admitting: Hematology and Oncology

## 2020-07-09 ENCOUNTER — Other Ambulatory Visit: Payer: Self-pay

## 2020-07-09 DIAGNOSIS — C50411 Malignant neoplasm of upper-outer quadrant of right female breast: Secondary | ICD-10-CM | POA: Diagnosis present

## 2020-07-09 DIAGNOSIS — Z170 Estrogen receptor positive status [ER+]: Secondary | ICD-10-CM

## 2020-07-09 MED ORDER — TECHNETIUM TC 99M MEDRONATE IV KIT
20.7000 | PACK | Freq: Once | INTRAVENOUS | Status: AC | PRN
  Administered 2020-07-09: 20.7 via INTRAVENOUS

## 2020-07-12 ENCOUNTER — Telehealth: Payer: Self-pay | Admitting: Hematology and Oncology

## 2020-07-12 ENCOUNTER — Encounter: Payer: Self-pay | Admitting: *Deleted

## 2020-07-12 ENCOUNTER — Telehealth: Payer: Self-pay | Admitting: *Deleted

## 2020-07-12 NOTE — Telephone Encounter (Signed)
Scheduled per 3/30 los. Pt will receive an updated appt calendar per next visit appt notes

## 2020-07-12 NOTE — Telephone Encounter (Signed)
Connected with patient regarding Leave of absence.  Apologized for any inconvenience.  Reported form faxed today to [employer] [facility]: [phone].  Received successful fax confirmation.  Transmission included note [facility] forms nurse currently backlogged three to four weeks.  Envelope with registration for pick-up this week.  Currently no questions or needs.

## 2020-07-13 ENCOUNTER — Encounter: Payer: Self-pay | Admitting: Hematology and Oncology

## 2020-07-14 NOTE — Assessment & Plan Note (Signed)
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 palpable right breast mass measuring about 7 cm x 5 and half centimeters, palpable right axillary lymph node with concern for skin invasion   Treatment Plan: 1. Neoadjuvant chemo with TCHP q 3 weeks X 6 followed by HP vs Kadcyla 2. Mastectomy with TAD 3. Adj XRT 4. Adj Anti-estrogen therapy 5. Neratinib --------------------------------------------------------------------------------------------------------- Current Treatment : neoadj TCHP started 06/25/20, Today is cycle 2 (taxotere and Perjeta are being held)  Chemo Toxicities: 1. Bloody Diarrhea: Still ongoing issue 2. Hospitalization 3/23-3/25/22: History of Sub total colectomy, Crohns disease, Iron deficiency Anemia 3. Bone Pain  Iron Deficiency Anemia: Hemoglobin has improved to 11.7 on 07/06/2020. RTC in 3 weeks for cycle 3

## 2020-07-14 NOTE — Progress Notes (Signed)
[name] Care Team: [name], [name] (Inactive) as PCP - General (General Practice) [name], RN as Oncology Nurse Navigator [name], RN as Oncology Nurse Navigator  DIAGNOSIS:    ICD-10-CM   1. 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 palpable right breast mass measuring about 7 cm x 5 and half centimeters, palpable right axillary lymph node with concern for skin invasion    06/08/2020 Cancer Staging   Staging form: Breast, AJCC 8th Edition - Clinical: Stage IIIB (cT4d, cN1, cM0, G3, ER+, PR-, HER2+) - Signed by [name], MD on 07/07/2020 Histologic grading system: 3 grade system   06/24/2020 Genetic Testing   No pathogenic variants detected in [facility] [employer] [facility]RNA[employer]ight.  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  The report date is June 24, 2020.   The CustomNext-Cancer+RNAinsight panel offered by [facility] includes sequencing and rearrangement analysis for the following 47 genes:  APC, ATM, AXIN2, BARD1, BMPR1A, BRCA1, BRCA2, BRIP1, CDH1, CDK4, CDKN2A, CHEK2, DICER1, EPCAM, GREM1, HOXB13, MEN1, MLH1, MSH2, MSH3, MSH6, MUTYH, NBN, NF1, NF2, NTHL1, PALB2, PMS2, POLD1, POLE, PTEN, RAD51C, RAD51D, RECQL, RET, SDHA, SDHAF2, SDHB, SDHC, SDHD, SMAD4, SMARCA4, STK11, TP53, TSC1, TSC2, and VHL.  RNA data is routinely analyzed for use in variant interpretation for all genes.   06/25/2020 -  Chemotherapy     [name] is on Treatment Plan: BREAST  DOCETAXEL + CARBOPLATIN + TRASTUZUMAB + PERTUZUMAB  (TCHP) Q21D         CHIEF COMPLIANT: Cycle 2 TCHP  INTERVAL HISTORY: [name] is a 45 y.o. with above-mentioned history of HER-2 positive right breast cancer currently on neoadjuvant chemotherapy with TCH Perjeta, but Perjeta and Taxotere have been discontinued due to gastrointestinal side effects. She presents to the clinic today for treatment.   ALLERGIES:  is allergic to other.  MEDICATIONS:  Current Outpatient Medications  Medication Sig Dispense Refill  . dexamethasone (DECADRON) 4 MG tablet Take 2 tablets (8 mg total) by mouth 2 (two) times daily. Start the day before Taxotere. Then take daily x 3 days after chemotherapy. 30 tablet 1  . diphenoxylate-atropine (LOMOTIL) 2.5-0.025 MG tablet 1 to 2 tablets before meals and also use at bedtime if needed 90 tablet 1  . ferrous sulfate 325 (65 FE) MG tablet Take 325 mg by mouth daily with breakfast.    . lidocaine-prilocaine (EMLA) cream Apply 1 application topically as needed (Prior to port access). 30 g 2  . loratadine (CLARITIN) 10 MG tablet Take 10 mg by mouth See admin instructions. Take 7 days starting the day of injection    . LORazepam (ATIVAN) 0.5 MG tablet Take 1 tablet (0.5 mg total) by mouth every 6 (six) hours as needed (Nausea or vomiting). 30 tablet 0  . mesalamine (LIALDA) 1.2 g EC tablet Take 2 tablets (2.4 g total) by mouth 2 (two) times daily with a meal. 120 tablet 2  . metroNIDAZOLE (FLAGYL)  500 MG tablet Take 1 tablet (500 mg total) by mouth 3 (three) times daily for 14 days. 42 tablet 0  . Multiple Vitamin (MULTIVITAMIN) tablet Take 1 tablet by mouth daily.    . ondansetron (ZOFRAN) 8 MG tablet Take 1 tablet (8 mg total) by mouth 2 (two) times daily as needed (Nausea or vomiting). Start on the third day after chemotherapy. 30 tablet 1  . prochlorperazine (COMPAZINE) 10 MG tablet Take 1 tablet (10 mg total) by mouth every 6  (six) hours as needed (Nausea or vomiting). 30 tablet 1  . traMADol (ULTRAM) 50 MG tablet Take 1 tablet (50 mg total) by mouth every 6 (six) hours as needed for severe pain. 30 tablet 0   No current facility-administered medications for this visit.    PHYSICAL EXAMINATION: ECOG PERFORMANCE STATUS: 1 - Symptomatic but completely ambulatory  Vitals:   07/15/20 0938  BP: 127/79  Pulse: 94  Resp: 18  Temp: 98.1 F (36.7 C)  SpO2: 100%   Filed Weights   07/15/20 0938  Weight: 149 lb 9.6 oz (67.9 kg)      LABORATORY DATA:  I have reviewed the data as listed CMP Latest Ref Rng & Units 07/15/2020 07/01/2020 06/30/2020  Glucose 70 - 99 mg/dL 112(H) 99 92  BUN 6 - 20 mg/dL _0 Creatinine 0.44 - 1.00 mg/dL 0.73 0.81 0.58  Sodium 135 - 145 mmol/L 141 135 137  Potassium 3.5 - 5.1 mmol/L 3.8 3.9 3.7  Chloride 98 - 111 mmol/L 108 101 105  CO2 22 - 32 mmol/L _1 Calcium 8.9 - 10.3 mg/dL 8.4(L) 8.9 8.3(L)  Total Protein 6.5 - 8.1 g/dL 6.5 - -  Total Bilirubin 0.3 - 1.2 mg/dL <0.2(L) - -  Alkaline Phos 38 - 126 U/L 105 - -  AST 15 - 41 U/L 13(L) - -  ALT 0 - 44 U/L 10 - -    Lab Results  Component Value Date   WBC 9.2 07/15/2020   HGB 9.6 (L) 07/15/2020   HCT 30.2 (L) 07/15/2020   MCV 84.4 07/15/2020   PLT 311 07/15/2020   NEUTROABS 7.4 07/15/2020    ASSESSMENT & PLAN:  Malignant neoplasm of upper-outer quadrant of right breast in female, estrogen receptor positive ([place]) 06/03/2020, US guided biopsy of the right breast 9 0 clock mass showed grade III IDC; Prognostics showed ER 40% positive, weak staining, PR positive 0 %, Her 2 3 +, Ki 67 30% 4dCN1M0 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 palpable right breast mass measuring about 7 cm x 5 and half centimeters, palpable right axillary lymph node with concern for skin invasion   Treatment Plan: 1. Neoadjuvant chemo with TCHP q 3 weeks X 6 followed by HP vs Kadcyla 2. Mastectomy with TAD 3. Adj XRT 4. Adj  Anti-estrogen therapy 5. Neratinib --------------------------------------------------------------------------------------------------------- Current Treatment : neoadj TCHP started 06/25/20, Today is cycle 2 (taxotere and Perjeta are being held)  Chemo Toxicities: 1. Bloody Diarrhea: Much improved 2. Hospitalization 3/23-3/25/22: History of Sub total colectomy, Crohns disease, Iron deficiency Anemia 3. Bone Pain  Iron Deficiency Anemia: Hemoglobin is fluctuating up and down today it is 9.6 For this treatment we are holding Taxotere and Perjeta.  Depending on how she does with this treatment we will discuss bringing back Taxotere at a lower dosage for the next cycle. Return to clinic in 1 week for toxicity check and then we will decide on cycle 3.   No orders  of the defined types were placed in this encounter.  The [name] has a good understanding of the overall plan. she agrees with it. she will call with any problems that may develop before the next visit here.  Total time spent: 30 mins including face to face time and time spent for planning, charting and coordination of care  Rulon Eisenmenger, MD, MPH 07/15/2020  I, Molly Dorshimer, am acting as scribe for Dr. [name].  I have reviewed the above documentation for accuracy and completeness, and I agree with the above.

## 2020-07-15 ENCOUNTER — Inpatient Hospital Stay: Payer: Managed Care, Other (non HMO) | Admitting: Hematology and Oncology

## 2020-07-15 ENCOUNTER — Inpatient Hospital Stay: Payer: Managed Care, Other (non HMO)

## 2020-07-15 ENCOUNTER — Inpatient Hospital Stay: Payer: Managed Care, Other (non HMO) | Attending: Hematology and Oncology

## 2020-07-15 ENCOUNTER — Other Ambulatory Visit: Payer: Self-pay

## 2020-07-15 DIAGNOSIS — D509 Iron deficiency anemia, unspecified: Secondary | ICD-10-CM | POA: Insufficient documentation

## 2020-07-15 DIAGNOSIS — Z5112 Encounter for antineoplastic immunotherapy: Secondary | ICD-10-CM | POA: Insufficient documentation

## 2020-07-15 DIAGNOSIS — Z170 Estrogen receptor positive status [ER+]: Secondary | ICD-10-CM

## 2020-07-15 DIAGNOSIS — Z95828 Presence of other vascular implants and grafts: Secondary | ICD-10-CM | POA: Insufficient documentation

## 2020-07-15 DIAGNOSIS — C50411 Malignant neoplasm of upper-outer quadrant of right female breast: Secondary | ICD-10-CM

## 2020-07-15 DIAGNOSIS — C50811 Malignant neoplasm of overlapping sites of right female breast: Secondary | ICD-10-CM | POA: Diagnosis present

## 2020-07-15 DIAGNOSIS — Z5111 Encounter for antineoplastic chemotherapy: Secondary | ICD-10-CM | POA: Insufficient documentation

## 2020-07-15 LAB — CBC WITH DIFFERENTIAL/PLATELET
Abs Immature Granulocytes: 0.04 10*3/uL (ref 0.00–0.07)
Basophils Absolute: 0 10*3/uL (ref 0.0–0.1)
Basophils Relative: 0 %
Eosinophils Absolute: 0 10*3/uL (ref 0.0–0.5)
Eosinophils Relative: 0 %
HCT: 30.2 % — ABNORMAL LOW (ref 36.0–46.0)
Hemoglobin: 9.6 g/dL — ABNORMAL LOW (ref 12.0–15.0)
Immature Granulocytes: 0 %
Lymphocytes Relative: 14 %
Lymphs Abs: 1.3 10*3/uL (ref 0.7–4.0)
MCH: 26.8 pg (ref 26.0–34.0)
MCHC: 31.8 g/dL (ref 30.0–36.0)
MCV: 84.4 fL (ref 80.0–100.0)
Monocytes Absolute: 0.6 10*3/uL (ref 0.1–1.0)
Monocytes Relative: 6 %
Neutro Abs: 7.4 10*3/uL (ref 1.7–7.7)
Neutrophils Relative %: 80 %
Platelets: 311 10*3/uL (ref 150–400)
RBC: 3.58 MIL/uL — ABNORMAL LOW (ref 3.87–5.11)
RDW: 15.2 % (ref 11.5–15.5)
WBC: 9.2 10*3/uL (ref 4.0–10.5)
nRBC: 0 % (ref 0.0–0.2)

## 2020-07-15 LAB — COMPREHENSIVE METABOLIC PANEL
ALT: 10 U/L (ref 0–44)
AST: 13 U/L — ABNORMAL LOW (ref 15–41)
Albumin: 3.1 g/dL — ABNORMAL LOW (ref 3.5–5.0)
Alkaline Phosphatase: 105 U/L (ref 38–126)
Anion gap: 10 (ref 5–15)
BUN: 8 mg/dL (ref 6–20)
CO2: 23 mmol/L (ref 22–32)
Calcium: 8.4 mg/dL — ABNORMAL LOW (ref 8.9–10.3)
Chloride: 108 mmol/L (ref 98–111)
Creatinine, Ser: 0.73 mg/dL (ref 0.44–1.00)
GFR, Estimated: >60 mL/min (ref >60)
Glucose, Bld: 112 mg/dL — ABNORMAL HIGH (ref 70–99)
Potassium: 3.8 mmol/L (ref 3.5–5.1)
Sodium: 141 mmol/L (ref 135–145)
Total Bilirubin: <0.2 mg/dL — ABNORMAL LOW (ref 0.3–1.2)
Total Protein: 6.5 g/dL (ref 6.5–8.1)

## 2020-07-15 LAB — MAGNESIUM: Magnesium: 1.7 mg/dL (ref 1.7–2.4)

## 2020-07-15 MED ORDER — DIPHENHYDRAMINE HCL 25 MG PO CAPS
50.0000 mg | ORAL_CAPSULE | Freq: Once | ORAL | Status: AC
  Administered 2020-07-15: 50 mg via ORAL

## 2020-07-15 MED ORDER — ACETAMINOPHEN 325 MG PO TABS
650.0000 mg | ORAL_TABLET | Freq: Once | ORAL | Status: AC
  Administered 2020-07-15: 650 mg via ORAL

## 2020-07-15 MED ORDER — SODIUM CHLORIDE 0.9% FLUSH
10.0000 mL | INTRAVENOUS | Status: DC | PRN
  Administered 2020-07-15: 10 mL
  Filled 2020-07-15: qty 10

## 2020-07-15 MED ORDER — DIPHENHYDRAMINE HCL 25 MG PO CAPS
ORAL_CAPSULE | ORAL | Status: AC
  Filled 2020-07-15: qty 2

## 2020-07-15 MED ORDER — TRASTUZUMAB-ANNS CHEMO 150 MG IV SOLR
6.0000 mg/kg | Freq: Once | INTRAVENOUS | Status: AC
  Administered 2020-07-15: 420 mg via INTRAVENOUS
  Filled 2020-07-15: qty 20

## 2020-07-15 MED ORDER — PALONOSETRON HCL INJECTION 0.25 MG/5ML
0.2500 mg | Freq: Once | INTRAVENOUS | Status: AC
  Administered 2020-07-15: 0.25 mg via INTRAVENOUS

## 2020-07-15 MED ORDER — PALONOSETRON HCL INJECTION 0.25 MG/5ML
INTRAVENOUS | Status: AC
  Filled 2020-07-15: qty 5

## 2020-07-15 MED ORDER — SODIUM CHLORIDE 0.9 % IV SOLN
150.0000 mg | Freq: Once | INTRAVENOUS | Status: AC
  Administered 2020-07-15: 150 mg via INTRAVENOUS
  Filled 2020-07-15: qty 150

## 2020-07-15 MED ORDER — HEPARIN SOD (PORK) LOCK FLUSH 100 UNIT/ML IV SOLN
500.0000 [IU] | Freq: Once | INTRAVENOUS | Status: AC | PRN
  Administered 2020-07-15: 500 [IU]
  Filled 2020-07-15: qty 5

## 2020-07-15 MED ORDER — ACETAMINOPHEN 325 MG PO TABS
ORAL_TABLET | ORAL | Status: AC
  Filled 2020-07-15: qty 2

## 2020-07-15 MED ORDER — SODIUM CHLORIDE 0.9 % IV SOLN
633.5000 mg | Freq: Once | INTRAVENOUS | Status: AC
  Administered 2020-07-15: 630 mg via INTRAVENOUS
  Filled 2020-07-15: qty 63

## 2020-07-15 MED ORDER — SODIUM CHLORIDE 0.9% FLUSH
10.0000 mL | Freq: Once | INTRAVENOUS | Status: AC
  Administered 2020-07-15: 10 mL
  Filled 2020-07-15: qty 10

## 2020-07-15 MED ORDER — SODIUM CHLORIDE 0.9 % IV SOLN
Freq: Once | INTRAVENOUS | Status: AC
  Filled 2020-07-15: qty 250

## 2020-07-15 MED ORDER — SODIUM CHLORIDE 0.9 % IV SOLN
10.0000 mg | Freq: Once | INTRAVENOUS | Status: AC
  Administered 2020-07-15: 10 mg via INTRAVENOUS
  Filled 2020-07-15: qty 10

## 2020-07-15 NOTE — Patient Instructions (Addendum)
[facility] Discharge Instructions for Patients Receiving Chemotherapy  Today you received the following chemotherapy agents trastuzumab and carboplatin.   To help prevent nausea and vomiting after your treatment, we encourage you to take your nausea medication as directed.    If you develop nausea and vomiting that is not controlled by your nausea medication, call the clinic.   BELOW ARE SYMPTOMS THAT SHOULD BE REPORTED IMMEDIATELY:  *FEVER GREATER THAN 100.5 F  *CHILLS WITH OR WITHOUT FEVER  NAUSEA AND VOMITING THAT IS NOT CONTROLLED WITH YOUR NAUSEA MEDICATION  *UNUSUAL SHORTNESS OF BREATH  *UNUSUAL BRUISING OR BLEEDING  TENDERNESS IN MOUTH AND THROAT WITH OR WITHOUT PRESENCE OF ULCERS  *URINARY PROBLEMS  *BOWEL PROBLEMS  UNUSUAL RASH Items with * indicate a potential emergency and should be followed up as soon as possible.  Feel free to call the clinic should you have any questions or concerns. The clinic phone number is [phone].  Please show the [facility] at check-in to the Emergency Department and triage nurse.

## 2020-07-17 ENCOUNTER — Ambulatory Visit: Payer: Managed Care, Other (non HMO)

## 2020-07-19 ENCOUNTER — Encounter: Payer: Self-pay | Admitting: Hematology and Oncology

## 2020-07-20 ENCOUNTER — Ambulatory Visit: Payer: Managed Care, Other (non HMO) | Admitting: Internal Medicine

## 2020-07-20 VITALS — BP 118/72 | HR 78 | Ht 63.0 in | Wt 145.0 lb

## 2020-07-20 DIAGNOSIS — K50112 Crohn's disease of large intestine with intestinal obstruction: Secondary | ICD-10-CM

## 2020-07-20 DIAGNOSIS — T50905D Adverse effect of unspecified drugs, medicaments and biological substances, subsequent encounter: Secondary | ICD-10-CM

## 2020-07-20 NOTE — Assessment & Plan Note (Signed)
She is much improved.  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  In the past I had wondered about small bowel disease based upon a CT scan she had when she was ill the other year but that might have been a gastroenteritis.  On top of this she had this Taxotere related toxicity.  She was also neutropenic.  She is recovering from that.  I will plan to see her back in July she may cancel that if she is feeling really well.  I do agree with oncology plans to dose reduce her Taxotere.  I think that makes the best overall sense.  Continue twice daily mesalamine 2.4 g twice a day at 4.8 g total.  At some point in the future we may consider dose reduction.

## 2020-07-20 NOTE — Patient Instructions (Addendum)
Continue your Lialda per Dr Carlean Purl.  Please make a July appointment and if your doing will you may cancel.   I appreciate the opportunity to care for you. [name], MD, [facility]

## 2020-07-20 NOTE — Progress Notes (Signed)
[name] 45 y.o. 08-11-1975 [id number]  Assessment & Plan:   Encounter Diagnoses  Name Primary?  . Medication side effect, subsequent encounter Yes  . Crohn's colitis, with intestinal obstruction ([place])     Crohn's colitis, with intestinal obstruction ([place]) She is much improved.  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  In the past I had wondered about small bowel disease based upon a CT scan she had when she was ill the other year but that might have been a gastroenteritis.  On top of this she had this Taxotere related toxicity.  She was also neutropenic.  She is recovering from that.  I will plan to see her back in July she may cancel that if she is feeling really well.  I do agree with oncology plans to dose reduce her Taxotere.  I think that makes the best overall sense.  Continue twice daily mesalamine 2.4 g twice a day at 4.8 g total.  At some point in the future we may consider dose reduction.   Copy to Dr. Lindi Adie   Subjective:   Chief Complaint: Colitis  HPI [name] is a 45 year old African-American woman with a history of Crohn's disease which was stricturing requiring a subtotal colectomy, who was in the hospital recently with a severe colitis thought likely due to Taxotere plus or minus some component of her chronic Crohn's.  She is now on mesalamine 2.4 g twice daily and has not had her Taxotere in some time, she had her chemotherapy regimen just this week but the Taxotere was held.  She is having 1-3 stools a day mostly formed and she is feeling much better.  The plan is to do a dose reduction of Taxotere going forward.  She is not needing Lomotil on a regular basis. Allergies  Allergen Reactions  . Other     BLOOD PRODUCT REFUSAL   Current Meds  Medication Sig  . dexamethasone (DECADRON) 4 MG tablet Take 2 tablets (8 mg total) by mouth 2 (two) times daily. Start the day before Taxotere. Then take daily x 3 days after  chemotherapy.  . diphenoxylate-atropine (LOMOTIL) 2.5-0.025 MG tablet 1 to 2 tablets before meals and also use at bedtime if needed  . ferrous sulfate 325 (65 FE) MG tablet Take 325 mg by mouth daily with breakfast.  . lidocaine-prilocaine (EMLA) cream Apply 1 application topically as needed (Prior to port access).  Marland Kitchen loratadine (CLARITIN) 10 MG tablet Take 10 mg by mouth See admin instructions. Take 7 days starting the day of injection  . LORazepam (ATIVAN) 0.5 MG tablet Take 1 tablet (0.5 mg total) by mouth every 6 (six) hours as needed (Nausea or vomiting).  . mesalamine (LIALDA) 1.2 g EC tablet Take 2 tablets (2.4 g total) by mouth 2 (two) times daily with a meal.  . Multiple Vitamin (MULTIVITAMIN) tablet Take 1 tablet by mouth daily.  . ondansetron (ZOFRAN) 8 MG tablet Take 1 tablet (8 mg total) by mouth 2 (two) times daily as needed (Nausea or vomiting). Start on the third day after chemotherapy.  . prochlorperazine (COMPAZINE) 10 MG tablet Take 1 tablet (10 mg total) by mouth every 6 (six) hours as needed (Nausea or vomiting).  . traMADol (ULTRAM) 50 MG tablet Take 1 tablet (50 mg total) by mouth every 6 (six) hours as needed for severe pain.   Past Medical History:  Diagnosis Date  . Colon stricture ([place]) 05/07/2019  . Crohn's disease ([place])   .  Family history of breast cancer 06/11/2020  . 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 Rectal bleeding   . Uterine fibroid   . Vitamin D deficiency 09/24/2013   Past Surgical History:  Procedure Laterality Date  . BIOPSY  07/01/2020   Procedure: BIOPSY;  Surgeon: Yetta Flock, MD;  Location: [facility];  Service: Gastroenterology;;  . [name]  06/27/2019  . COLONOSCOPY  2015  . FLEXIBLE SIGMOIDOSCOPY N/A 07/01/2020   Procedure: FLEXIBLE SIGMOIDOSCOPY;  Surgeon: Yetta Flock, MD;  Location: [facility];  Service: Gastroenterology;  Laterality: N/A;  . PORTACATH PLACEMENT N/A  06/23/2020   Procedure: INSERTION PORT-A-CATH;  Surgeon: [name], MD;  Location: [facility];  Service: General;  Laterality: N/A;  START TIME OF 11:00 AM FOR 77 MINUTES [place] [facility]   Social History   Social History Narrative   Single, 1 daughter born 2001 approximately   Medical coder for [employer]   She is a former smoker, occasional alcohol no drug use   family history includes Breast cancer in her cousin and maternal aunt; Diabetes in her father and paternal aunt.   Review of Systems   Objective:   Physical Exam

## 2020-07-21 NOTE — Progress Notes (Signed)
Patient Care Team: Patient, [name] (Inactive) as PCP - General (General Practice) [name], RN as Oncology Nurse Navigator [name], RN as Oncology Nurse Navigator  DIAGNOSIS:    ICD-10-CM   1. 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 palpable right breast mass measuring about 7 cm x 5 and half centimeters, palpable right axillary lymph node with concern for skin invasion    06/08/2020 Cancer Staging   Staging form: Breast, AJCC 8th Edition - Clinical: Stage IIIB (cT4d, cN1, cM0, G3, ER+, PR-, HER2+) - Signed by [name], MD on 07/07/2020 Histologic grading system: 3 grade system   06/24/2020 Genetic Testing   No pathogenic variants detected in [facility] [employer]RNA[employer]ight.  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  The report date is June 24, 2020.   The CustomNext-Cancer+RNAinsight panel offered by [facility] includes sequencing and rearrangement analysis for the following 47 genes:  APC, ATM, AXIN2, BARD1, BMPR1A, BRCA1, BRCA2, BRIP1, CDH1, CDK4, CDKN2A, CHEK2, DICER1, EPCAM, GREM1, HOXB13, MEN1, MLH1, MSH2, MSH3, MSH6, MUTYH, NBN, NF1, NF2, NTHL1, PALB2, PMS2, POLD1, POLE, PTEN, RAD51C, RAD51D, RECQL, RET, SDHA, SDHAF2, SDHB, SDHC, SDHD, SMAD4, SMARCA4, STK11, TP53, TSC1, TSC2, and VHL.  RNA data is routinely analyzed for use in variant interpretation for all genes.   06/25/2020 -  Chemotherapy     Patient is on Treatment Plan: BREAST  DOCETAXEL + CARBOPLATIN + TRASTUZUMAB + PERTUZUMAB  (TCHP) Q21D         CHIEF COMPLIANT: Cycle 2 Day 8 TCHP  INTERVAL HISTORY: [name] is a 45 y.o. with above-mentioned history of HER-2 positive right breast cancer currently on neoadjuvant chemotherapy with TCH Perjeta, but Perjeta and Taxotere have been discontinued due to gastrointestinal side effects. She presents to the clinic today for a toxicity check.  Cycle 2 was very well-tolerated because of eliminated Taxotere and Perjeta.  She had 1-2 loose stools but no bloody stools.  She has seen Dr. Ellender Hose yesterday.  She is feeling slight fatigue and mental cloudiness but otherwise doing extremely well.  She is here to discuss what to do with the next cycle of chemo.  ALLERGIES:  is allergic to other.  MEDICATIONS:  Current Outpatient Medications  Medication Sig Dispense Refill  . dexamethasone (DECADRON) 4 MG tablet Take 2 tablets (8 mg total) by mouth 2 (two) times daily. Start the day before Taxotere. Then take daily x 3 days after chemotherapy. 30 tablet 1  . diphenoxylate-atropine (LOMOTIL) 2.5-0.025 MG tablet 1 to 2 tablets before meals and also use at bedtime if needed 90 tablet 1  . ferrous sulfate 325 (65 FE) MG tablet Take 325 mg by mouth daily with breakfast.    . lidocaine-prilocaine (EMLA) cream Apply 1 application topically as needed (Prior to port access). 30 g 2  . loratadine (CLARITIN) 10 MG tablet Take 10 mg by mouth See admin instructions. Take 7 days starting the day  of injection    . LORazepam (ATIVAN) 0.5 MG tablet Take 1 tablet (0.5 mg total) by mouth every 6 (six) hours as needed (Nausea or vomiting). 30 tablet 0  . mesalamine (LIALDA) 1.2 g EC tablet Take 2 tablets (2.4 g total) by mouth 2 (two) times daily with a meal. 120 tablet 2  . Multiple Vitamin (MULTIVITAMIN) tablet Take 1 tablet by mouth daily.    . ondansetron (ZOFRAN) 8 MG tablet Take 1 tablet (8 mg total) by  mouth 2 (two) times daily as needed (Nausea or vomiting). Start on the third day after chemotherapy. 30 tablet 1  . prochlorperazine (COMPAZINE) 10 MG tablet Take 1 tablet (10 mg total) by mouth every 6 (six) hours as needed (Nausea or vomiting). 30 tablet 1  . traMADol (ULTRAM) 50 MG tablet Take 1 tablet (50 mg total) by mouth every 6 (six) hours as needed for severe pain. 30 tablet 0   No current facility-administered medications for this visit.    PHYSICAL EXAMINATION: ECOG PERFORMANCE STATUS: 1 - Symptomatic but completely ambulatory  There were no vitals filed for this visit. There were no vitals filed for this visit.   LABORATORY DATA:  I have reviewed the data as listed CMP Latest Ref Rng & Units 07/15/2020 07/01/2020 06/30/2020  Glucose 70 - 99 mg/dL 112(H) 99 92  BUN 6 - 20 mg/dL 8 10 11   Creatinine 0.44 - 1.00 mg/dL 0.73 0.81 0.58  Sodium 135 - 145 mmol/L 141 135 137  Potassium 3.5 - 5.1 mmol/L 3.8 3.9 3.7  Chloride 98 - 111 mmol/L 108 101 105  CO2 22 - 32 mmol/L 23 26 24   Calcium 8.9 - 10.3 mg/dL 8.4(L) 8.9 8.3(L)  Total Protein 6.5 - 8.1 g/dL 6.5 - -  Total Bilirubin 0.3 - 1.2 mg/dL <0.2(L) - -  Alkaline Phos 38 - 126 U/L 105 - -  AST 15 - 41 U/L 13(L) - -  ALT 0 - 44 U/L 10 - -    Lab Results  Component Value Date   WBC 9.2 07/15/2020   HGB 9.6 (L) 07/15/2020   HCT 30.2 (L) 07/15/2020   MCV 84.4 07/15/2020   PLT 311 07/15/2020   NEUTROABS 7.4 07/15/2020    ASSESSMENT & PLAN:  Malignant neoplasm of upper-outer quadrant of right breast in female, estrogen receptor positive ([place]) 06/03/2020, US guided biopsy of the right breast 9 0 clock mass showed grade III IDC; Prognostics showed ER 40% positive, weak staining, PR positive 0 %, Her 2 3 +, Ki 67 30% 4dCN1M0 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 palpable right breast mass measuring about 7 cm x 5 and half centimeters, palpable right axillary lymph node with concern for skin invasion  Treatment  Plan: 1. Neoadjuvant chemo with TCHP q 3 weeks X 6 followed by HP vs Kadcyla 2. Mastectomy with TAD 3. Adj XRT 4. Adj Anti-estrogen therapy 5. Neratinib --------------------------------------------------------------------------------------------------------- Current Treatment: neoadj TCHP started 06/25/20, Today is cycle 2 (taxotere and Perjeta were held for cycle 2), Taxotere at a reduced dose will be added for cycle 3  Chemo Toxicities: 1. Bloody Diarrhea: Much improved 2. Hospitalization 3/23-3/25/22:History ofSub total colectomy, Crohns disease, Iron deficiency Anemia 3. Bone Pain: Due to Neulasta which is being discontinued  Iron Deficiency Anemia: Hemoglobin is fluctuating up and down today it is 9.6 For the next cycle of chemo we plan to add Taxotere 30 mg per metered squared and will still hold off on Perjeta.  If she tolerates  that better than for the fourth cycle we will bring back Perjeta and increase Taxotere dosage.  This will gradually assess her tolerance to more and more chemotherapy.  Return to clinic in 2 weeks for cycle 3  No orders of the defined types were placed in this encounter.  The patient has a good understanding of the overall plan. she agrees with it. she will call with any problems that may develop before the next visit here.  Total time spent: 30 mins including face to face time and time spent for planning, charting and coordination of care  Rulon Eisenmenger, MD, MPH 07/22/2020  I, Molly Dorshimer, am acting as scribe for Dr. [name].  I have reviewed the above documentation for accuracy and completeness, and I agree with the above.

## 2020-07-21 NOTE — Assessment & Plan Note (Signed)
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 palpable right breast mass measuring about 7 cm x 5 and half centimeters, palpable right axillary lymph node with concern for skin invasion  Treatment Plan: 1. Neoadjuvant chemo with TCHP q 3 weeks X 6 followed by HP vs Kadcyla 2. Mastectomy with TAD 3. Adj XRT 4. Adj Anti-estrogen therapy 5. Neratinib --------------------------------------------------------------------------------------------------------- Current Treatment: neoadj TCHP started 06/25/20, Today is cycle 2 (taxotere and Perjeta are being held)  Chemo Toxicities: 1. Bloody Diarrhea: Much improved 2. Hospitalization 3/23-3/25/22:History ofSub total colectomy, Crohns disease, Iron deficiency Anemia 3. Bone Pain  Iron Deficiency Anemia: Hemoglobin is fluctuating up and down today it is 9.6 For this treatment we are holding Taxotere and Perjeta.  Depending on how she does with this treatment we will discuss bringing back Taxotere at a lower dosage for the next cycle.  Return to clinic in 2 weeks for cycle 3

## 2020-07-22 ENCOUNTER — Inpatient Hospital Stay: Payer: Managed Care, Other (non HMO)

## 2020-07-22 ENCOUNTER — Telehealth: Payer: Self-pay | Admitting: Internal Medicine

## 2020-07-22 ENCOUNTER — Inpatient Hospital Stay (HOSPITAL_BASED_OUTPATIENT_CLINIC_OR_DEPARTMENT_OTHER): Payer: Managed Care, Other (non HMO) | Admitting: Hematology and Oncology

## 2020-07-22 ENCOUNTER — Other Ambulatory Visit: Payer: Self-pay

## 2020-07-22 DIAGNOSIS — C50411 Malignant neoplasm of upper-outer quadrant of right female breast: Secondary | ICD-10-CM

## 2020-07-22 DIAGNOSIS — Z170 Estrogen receptor positive status [ER+]: Secondary | ICD-10-CM | POA: Diagnosis not present

## 2020-07-22 DIAGNOSIS — Z5112 Encounter for antineoplastic immunotherapy: Secondary | ICD-10-CM | POA: Diagnosis not present

## 2020-07-22 MED ORDER — MESALAMINE 1.2 G PO TBEC: 2.4000 g | DELAYED_RELEASE_TABLET | Freq: Two times a day (BID) | ORAL | 11 refills | Status: DC

## 2020-07-22 NOTE — Telephone Encounter (Signed)
Mesalamine refilled as requested. Seen recently and Dr Carlean Purl put in the note to continue this rx.

## 2020-07-27 ENCOUNTER — Encounter: Payer: Self-pay | Admitting: Hematology and Oncology

## 2020-07-28 ENCOUNTER — Telehealth: Payer: Self-pay

## 2020-07-28 ENCOUNTER — Encounter: Payer: Self-pay | Admitting: *Deleted

## 2020-07-28 NOTE — Telephone Encounter (Signed)
[name] from [employer] called to update regarding medication, Ziextenzo.  [phone] Ext [phone]  Per [name], Rx is at [facility] and ready to ship to patient if needed.  [name] states they will need to wait closer to date of 5/20 for shipment purposes if needed.

## 2020-07-28 NOTE — Progress Notes (Signed)
Per MD pt to not receive Ziextenzo following 08/05/20 tx due to decreased dose in taxotere.  Per MD pt may receive Ziextenzo following 08/26/20 infusion depending on tx dose and pt side effects.  RN LVM with [facility] Kitten case manager for home injection Ziextenzo and provided above information.  RN also placed call to [facility] mail order pharmacy to update on pt delivery date.  Pharmacy verified  order for Ziextenzo 6 mg subcutaneous injection to be self administered 08/27/20.  Total time spent on phone: 20 minutes.

## 2020-08-02 ENCOUNTER — Encounter: Payer: Self-pay | Admitting: Hematology and Oncology

## 2020-08-03 ENCOUNTER — Encounter: Payer: Self-pay | Admitting: Hematology and Oncology

## 2020-08-03 ENCOUNTER — Encounter: Payer: Self-pay | Admitting: *Deleted

## 2020-08-05 ENCOUNTER — Inpatient Hospital Stay: Payer: Managed Care, Other (non HMO)

## 2020-08-05 ENCOUNTER — Ambulatory Visit: Payer: Managed Care, Other (non HMO) | Admitting: Hematology and Oncology

## 2020-08-05 ENCOUNTER — Other Ambulatory Visit: Payer: Self-pay

## 2020-08-05 ENCOUNTER — Inpatient Hospital Stay: Payer: Managed Care, Other (non HMO) | Admitting: Medical

## 2020-08-05 VITALS — BP 130/90 | HR 95

## 2020-08-05 VITALS — BP 152/101 | HR 120 | Temp 98.2°F | Resp 18 | Ht 63.0 in | Wt 146.7 lb

## 2020-08-05 DIAGNOSIS — C50411 Malignant neoplasm of upper-outer quadrant of right female breast: Secondary | ICD-10-CM

## 2020-08-05 DIAGNOSIS — Z170 Estrogen receptor positive status [ER+]: Secondary | ICD-10-CM

## 2020-08-05 DIAGNOSIS — Z95828 Presence of other vascular implants and grafts: Secondary | ICD-10-CM

## 2020-08-05 DIAGNOSIS — Z5112 Encounter for antineoplastic immunotherapy: Secondary | ICD-10-CM | POA: Diagnosis not present

## 2020-08-05 LAB — CBC WITH DIFFERENTIAL/PLATELET
Abs Immature Granulocytes: 0.03 10*3/uL (ref 0.00–0.07)
Basophils Absolute: 0 10*3/uL (ref 0.0–0.1)
Basophils Relative: 0 %
Eosinophils Absolute: 0 10*3/uL (ref 0.0–0.5)
Eosinophils Relative: 0 %
HCT: 30.5 % — ABNORMAL LOW (ref 36.0–46.0)
Hemoglobin: 9.7 g/dL — ABNORMAL LOW (ref 12.0–15.0)
Immature Granulocytes: 1 %
Lymphocytes Relative: 6 %
Lymphs Abs: 0.4 10*3/uL — ABNORMAL LOW (ref 0.7–4.0)
MCH: 27.4 pg (ref 26.0–34.0)
MCHC: 31.8 g/dL (ref 30.0–36.0)
MCV: 86.2 fL (ref 80.0–100.0)
Monocytes Absolute: 0.3 10*3/uL (ref 0.1–1.0)
Monocytes Relative: 5 %
Neutro Abs: 5.8 10*3/uL (ref 1.7–7.7)
Neutrophils Relative %: 88 %
Platelets: 150 10*3/uL (ref 150–400)
RBC: 3.54 MIL/uL — ABNORMAL LOW (ref 3.87–5.11)
RDW: 16.1 % — ABNORMAL HIGH (ref 11.5–15.5)
WBC: 6.5 10*3/uL (ref 4.0–10.5)
nRBC: 0 % (ref 0.0–0.2)

## 2020-08-05 LAB — COMPREHENSIVE METABOLIC PANEL
ALT: 14 U/L (ref 0–44)
AST: 15 U/L (ref 15–41)
Albumin: 3.7 g/dL (ref 3.5–5.0)
Alkaline Phosphatase: 115 U/L (ref 38–126)
Anion gap: 12 (ref 5–15)
BUN: 10 mg/dL (ref 6–20)
CO2: 23 mmol/L (ref 22–32)
Calcium: 9.3 mg/dL (ref 8.9–10.3)
Chloride: 105 mmol/L (ref 98–111)
Creatinine, Ser: 0.77 mg/dL (ref 0.44–1.00)
GFR, Estimated: >60 mL/min (ref >60)
Glucose, Bld: 128 mg/dL — ABNORMAL HIGH (ref 70–99)
Potassium: 3.8 mmol/L (ref 3.5–5.1)
Sodium: 140 mmol/L (ref 135–145)
Total Bilirubin: 0.4 mg/dL (ref 0.3–1.2)
Total Protein: 7.9 g/dL (ref 6.5–8.1)

## 2020-08-05 LAB — MAGNESIUM: Magnesium: 2 mg/dL (ref 1.7–2.4)

## 2020-08-05 MED ORDER — HEPARIN SOD (PORK) LOCK FLUSH 100 UNIT/ML IV SOLN
500.0000 [IU] | Freq: Once | INTRAVENOUS | Status: AC | PRN
  Administered 2020-08-05: 500 [IU]
  Filled 2020-08-05: qty 5

## 2020-08-05 MED ORDER — PALONOSETRON HCL INJECTION 0.25 MG/5ML
0.2500 mg | Freq: Once | INTRAVENOUS | Status: AC
  Administered 2020-08-05: 0.25 mg via INTRAVENOUS

## 2020-08-05 MED ORDER — TRASTUZUMAB-ANNS CHEMO 150 MG IV SOLR
6.0000 mg/kg | Freq: Once | INTRAVENOUS | Status: AC
  Administered 2020-08-05: 420 mg via INTRAVENOUS
  Filled 2020-08-05: qty 20

## 2020-08-05 MED ORDER — SODIUM CHLORIDE 0.9 % IV SOLN
Freq: Once | INTRAVENOUS | Status: AC
  Filled 2020-08-05: qty 250

## 2020-08-05 MED ORDER — SODIUM CHLORIDE 0.9 % IV SOLN
150.0000 mg | Freq: Once | INTRAVENOUS | Status: AC
  Administered 2020-08-05: 150 mg via INTRAVENOUS
  Filled 2020-08-05: qty 150

## 2020-08-05 MED ORDER — SODIUM CHLORIDE 0.9 % IV SOLN
633.5000 mg | Freq: Once | INTRAVENOUS | Status: AC
  Administered 2020-08-05: 630 mg via INTRAVENOUS
  Filled 2020-08-05: qty 63

## 2020-08-05 MED ORDER — SODIUM CHLORIDE 0.9% FLUSH
10.0000 mL | INTRAVENOUS | Status: DC | PRN
  Administered 2020-08-05: 10 mL
  Filled 2020-08-05: qty 10

## 2020-08-05 MED ORDER — ACETAMINOPHEN 325 MG PO TABS
650.0000 mg | ORAL_TABLET | Freq: Once | ORAL | Status: AC
  Administered 2020-08-05: 650 mg via ORAL

## 2020-08-05 MED ORDER — PALONOSETRON HCL INJECTION 0.25 MG/5ML
INTRAVENOUS | Status: AC
  Filled 2020-08-05: qty 5

## 2020-08-05 MED ORDER — DIPHENHYDRAMINE HCL 25 MG PO CAPS
ORAL_CAPSULE | ORAL | Status: AC
  Filled 2020-08-05: qty 2

## 2020-08-05 MED ORDER — DIPHENHYDRAMINE HCL 25 MG PO CAPS
50.0000 mg | ORAL_CAPSULE | Freq: Once | ORAL | Status: AC
  Administered 2020-08-05: 50 mg via ORAL

## 2020-08-05 MED ORDER — SODIUM CHLORIDE 0.9% FLUSH
10.0000 mL | Freq: Once | INTRAVENOUS | Status: AC
  Administered 2020-08-05: 10 mL
  Filled 2020-08-05: qty 10

## 2020-08-05 MED ORDER — SODIUM CHLORIDE 0.9 % IV SOLN
10.0000 mg | Freq: Once | INTRAVENOUS | Status: AC
  Administered 2020-08-05: 10 mg via INTRAVENOUS
  Filled 2020-08-05: qty 10

## 2020-08-05 MED ORDER — ACETAMINOPHEN 325 MG PO TABS
ORAL_TABLET | ORAL | Status: AC
  Filled 2020-08-05: qty 2

## 2020-08-05 MED ORDER — SODIUM CHLORIDE 0.9 % IV SOLN
30.0000 mg/m2 | Freq: Once | INTRAVENOUS | Status: AC
  Administered 2020-08-05: 50 mg via INTRAVENOUS
  Filled 2020-08-05: qty 5

## 2020-08-05 NOTE — Patient Instructions (Addendum)
[facility]    Discharge Instructions:  Thank you for choosing [facility] to provide your oncology and hematology care.   If you have a lab appointment with the [facility], please go directly to the [facility] and check in at the registration area.   Wear comfortable clothing and clothing appropriate for easy access to any Portacath or PICC line.   We strive to give you quality time with your provider. You may need to reschedule your appointment if you arrive late (15 or more minutes).  Arriving late affects you and other patients whose appointments are after yours.  Also, if you miss three or more appointments without notifying the office, you may be dismissed from the clinic at the provider's discretion.      For prescription refill requests, have your pharmacy contact our office and allow 72 hours for refills to be completed.    Today you received the following chemotherapy and/or immunotherapy agents:  trastuzumab-anns, docetaxel, and carboplatin.   To help prevent nausea and vomiting after your treatment, we encourage you to take your nausea medication as directed.  BELOW ARE SYMPTOMS THAT SHOULD BE REPORTED IMMEDIATELY: . *FEVER GREATER THAN 100.4 F (38 C) OR HIGHER . *CHILLS OR SWEATING . *NAUSEA AND VOMITING THAT IS NOT CONTROLLED WITH YOUR NAUSEA MEDICATION . *UNUSUAL SHORTNESS OF BREATH . *UNUSUAL BRUISING OR BLEEDING . *URINARY PROBLEMS (pain or burning when urinating, or frequent urination) . *BOWEL PROBLEMS (unusual diarrhea, constipation, pain near the anus) . TENDERNESS IN MOUTH AND THROAT WITH OR WITHOUT PRESENCE OF ULCERS (sore throat, sores in mouth, or a toothache) . UNUSUAL RASH, SWELLING OR PAIN  . UNUSUAL VAGINAL DISCHARGE OR ITCHING   Items with * indicate a potential emergency and should be followed up as soon as possible or go to the Emergency Department if any problems should occur.  Please show the  CHEMOTHERAPY ALERT CARD or IMMUNOTHERAPY ALERT CARD at check-in to the Emergency Department and triage nurse.  Should you have questions after your visit or need to cancel or reschedule your appointment, please contact [facility]  Dept: [phone]  and follow the prompts.  Office hours are 8:00 a.m. to 4:30 p.m. Monday - Friday. Please note that voicemails left after 4:00 p.m. may not be returned until the following business day.  We are closed weekends and major holidays. You have access to a nurse at all times for urgent questions. Please call the main number to the clinic Dept: [phone] and follow the prompts.   For any non-urgent questions, you may also contact your provider using MyChart. We now offer e-Visits for anyone 9 and older to request care online for non-urgent symptoms. For details visit [email or web address].   Also download the MyChart app! Go to the app store, search "MyChart", open the app, select [facility], and log in with your MyChart username and password.  Due to Covid, a mask is required upon entering the hospital/clinic. If you do not have a mask, one will be given to you upon arrival. For doctor visits, patients may have 1 support person aged 16 or older with them. For treatment visits, patients cannot have anyone with them due to current Covid guidelines and our immunocompromised population.

## 2020-08-05 NOTE — Progress Notes (Signed)
OK to treat.  Sandi Mealy, MHS, PA-C Physician Assistant

## 2020-08-05 NOTE — Progress Notes (Signed)
Per Dr. Lindi Adie, patient will not need GCSF support d/t docetaxel dose reduction. Patient aware and verbalizes understanding.

## 2020-08-05 NOTE — Progress Notes (Signed)
Symptoms Management Clinic Progress Note   [name] [id number] 03-26-1976 45 y.o.  [name] is managed by Dr. [name]  Actively treated with chemotherapy/immunotherapy/hormonal therapy: yes  Current therapy: Carboplatin, docetaxel, and Kanjinti  Last treated: 07/15/2020 (cycle 2, day 1) Cycle 2, day 1 of chemotherapy was given without docetaxel given the patient's GI bleeding following his treatment.  Next scheduled appointment with provider: 08/26/2020  Assessment: Plan:    Malignant neoplasm of upper-outer quadrant of right breast in female, estrogen receptor positive ([place])   ER positive malignant neoplasm of the right breast: Ms. [name] presents to the clinic today for consideration of cycle #3, day 1 of carboplatin, docetaxel, and Kanjinti.  Plans are to reintroduce docetaxel with today's cycle at a reduced dose.  We will proceed with her treatment today and have her return as scheduled on 08/26/2020.  Please see After Visit Summary for patient specific instructions.  Future Appointments  Date Time Provider [place]  08/26/2020  8:15 AM [facility] None  08/26/2020  8:30 AM [name], MD [facility] None  08/26/2020  9:30 AM [facility] INFUSION [facility] None  09/16/2020  8:15 AM [facility] FLUSH [facility] None  09/16/2020  8:45 AM [name], MD [facility] None  09/16/2020  9:30 AM [facility] INFUSION [facility] None  10/15/2020  2:30 PM [name], MD [facility]    No orders of the defined types were placed in this encounter.      Subjective:   Patient ID:  [name] is a 45 y.o. (DOB 08/23/1975) female.  Chief Complaint: No chief complaint on file.   HPI [name]  is a 45 y.o. female with a diagnosis of an ER positive malignant neoplasm of the right breast. She is followed by Dr. [name] and presents to the clinic today for consideration of cycle #3, day 1 of carboplatin,  docetaxel, and Kanjinti.  She reports having bloody diarrhea.  Her first cycle of chemotherapy which was given with docetaxel.  She has a history of Crohn's disease and has had a colectomy.  Plans are to reintroduce docetaxel with today's cycle at a reduced dose.  She is nervous regarding the reintroduction of docetaxel today but is also nervous that she is getting a reduced dose.  She is concerned that it might be less effective than her prior dose.  This was discussed with her at length today.  She feels more comfortable after a discussion regarding how we often have to adjust chemotherapy based on patient's sensitivity to that drug.  She is otherwise doing well.  She denies fevers, chills, sweats, nausea, vomiting.  Her appetite is good.  Medications: I have reviewed the patient's current medications.  Allergies:  Allergies  Allergen Reactions  . Other     BLOOD PRODUCT REFUSAL    Past Medical History:  Diagnosis Date  . Colon stricture ([place]) 05/07/2019  . Crohn's disease ([place])   . Family history of breast cancer 06/11/2020  . 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 Rectal bleeding   . Uterine fibroid   . Vitamin D deficiency 09/24/2013    Past Surgical History:  Procedure Laterality Date  . BIOPSY  07/01/2020   Procedure: BIOPSY;  Surgeon: Courtney Flock, MD;  Location: [facility];  Service: Gastroenterology;;  . [name]  06/27/2019  . COLONOSCOPY  2015  . FLEXIBLE SIGMOIDOSCOPY N/A 07/01/2020   Procedure: FLEXIBLE SIGMOIDOSCOPY;  Surgeon: Courtney Flock, MD;  Location: [facility];  Service: Gastroenterology;  Laterality: N/A;  . PORTACATH PLACEMENT N/A 06/23/2020   Procedure: INSERTION PORT-A-CATH;  Surgeon: [name], MD;  Location: [facility];  Service: General;  Laterality: N/A;  START TIME OF 11:00 AM FOR 60 MINUTES [place] [facility]    Family History  Problem Relation Age of Onset  . Breast  cancer Maternal Aunt        dx unknown age  . Diabetes Father   . Diabetes Paternal Aunt   . Breast cancer Cousin        maternal cousin; dx unknown age  . Esophageal cancer Neg Hx   . Rectal cancer Neg Hx   . Stomach cancer Neg Hx   . Colon polyps Neg Hx   . Colon cancer Neg Hx     Social History   Socioeconomic History  . Marital status: Significant Other    Spouse name: Not on file  . Number of children: 1  . Years of education: 33  . Highest education level: Not on file  Occupational History  . Occupation: Medical Coder   Tobacco Use  . Smoking status: Former Smoker    Types: Cigarettes  . Smokeless tobacco: Never Used  Vaping Use  . Vaping Use: Never used  Substance and Sexual Activity  . Alcohol use: Yes    Alcohol/week: 1.0 - 2.0 standard drink    Types: 1 - 2 Glasses of wine per week  . Drug use: No  . Sexual activity: Yes    Partners: Male    Comment: Intermittent protection not consistent  Other Topics Concern  . Not on file  Social History Narrative   Single, 1 daughter born 2001 approximately   Medical coder for [employer]   She is a former smoker, occasional alcohol no drug use   Social Determinants of Radio broadcast assistant Strain: Not on file  Food Insecurity: Not on file  Transportation Needs: Not on file  Physical Activity: Not on file  Stress: Not on file  Social Connections: Not on file  Intimate Partner Violence: Not on file    Past Medical History, Surgical history, Social history, and Family history were reviewed and updated as appropriate.   Please see review of systems for further details on the patient's review from today.   Review of Systems:  Review of Systems  Constitutional: Negative for chills, diaphoresis and fever.  HENT: Negative for trouble swallowing and voice change.   Respiratory: Negative for cough, chest tightness, shortness of breath and wheezing.   Cardiovascular: Negative for chest pain and palpitations.   Gastrointestinal: Negative for abdominal pain, constipation, diarrhea, nausea and vomiting.  Musculoskeletal: Negative for back pain and myalgias.  Neurological: Negative for dizziness, light-headedness and headaches.    Objective:   Physical Exam:  BP (!) 152/101 (BP Location: Left Arm, Patient Position: Sitting)   Pulse (!) 120   Temp 98.2 F (36.8 C) (Tympanic)   Resp 18   Ht 5' 3"  (1.6 m)   Wt 146 lb 11.2 oz (66.5 kg)   SpO2 100%   BMI 25.99 kg/m  ECOG: 0  Physical Exam Constitutional:      General: She is not in acute distress.    Appearance: She is not diaphoretic.  HENT:     Head: Normocephalic and atraumatic.  Eyes:     General: No scleral icterus.       Right eye: No discharge.  Left eye: No discharge.     Conjunctiva/sclera: Conjunctivae normal.  Cardiovascular:     Rate and Rhythm: Normal rate and regular rhythm.     Heart sounds: Normal heart sounds. No murmur heard. No friction rub. No gallop.   Pulmonary:     Effort: Pulmonary effort is normal. No respiratory distress.     Breath sounds: Normal breath sounds. No wheezing or rales.  Skin:    General: Skin is warm and dry.     Findings: No erythema or rash.  Neurological:     Mental Status: She is alert.     Coordination: Coordination normal.     Gait: Gait normal.  Psychiatric:        Mood and Affect: Mood normal.        Behavior: Behavior normal.        Thought Content: Thought content normal.        Judgment: Judgment normal.     Lab Review:     Component Value Date/Time   NA 140 08/05/2020 0854   NA 135 01/21/2020 1056   K 3.8 08/05/2020 0854   CL 105 08/05/2020 0854   CO2 23 08/05/2020 0854   GLUCOSE 128 (H) 08/05/2020 0854   BUN 10 08/05/2020 0854   BUN 12 01/21/2020 1056   CREATININE 0.77 08/05/2020 0854   CALCIUM 9.3 08/05/2020 0854   PROT 7.9 08/05/2020 0854   ALBUMIN 3.7 08/05/2020 0854   AST 15 08/05/2020 0854   ALT 14 08/05/2020 0854   ALKPHOS 115 08/05/2020 0854    BILITOT 0.4 08/05/2020 0854   GFRNONAA >60 08/05/2020 0854   GFRAA 108 01/21/2020 1056       Component Value Date/Time   WBC 6.5 08/05/2020 0854   RBC 3.54 (L) 08/05/2020 0854   HGB 9.7 (L) 08/05/2020 0854   HGB 10.4 (L) 01/21/2020 1056   HCT 30.5 (L) 08/05/2020 0854   HCT 33.9 (L) 01/21/2020 1056   PLT 150 08/05/2020 0854   PLT 346 01/21/2020 1056   MCV 86.2 08/05/2020 0854   MCV 71 (L) 01/21/2020 1056   MCH 27.4 08/05/2020 0854   MCHC 31.8 08/05/2020 0854   RDW 16.1 (H) 08/05/2020 0854   RDW 18.8 (H) 01/21/2020 1056   LYMPHSABS 0.4 (L) 08/05/2020 0854   LYMPHSABS 1.3 01/21/2020 1056   MONOABS 0.3 08/05/2020 0854   EOSABS 0.0 08/05/2020 0854   EOSABS 0.0 01/21/2020 1056   BASOSABS 0.0 08/05/2020 0854   BASOSABS 0.0 01/21/2020 1056   -------------------------------  Imaging from last 24 hours (if applicable):  Radiology interpretation: NM Bone Scan Whole Body  Result Date: 07/10/2020 CLINICAL DATA:  Breast cancer.  Evaluate for metastatic disease. EXAM: NUCLEAR MEDICINE WHOLE BODY BONE SCAN TECHNIQUE: Whole body anterior and posterior images were obtained approximately 3 hours after intravenous injection of radiopharmaceutical. RADIOPHARMACEUTICALS:  20.7 mCi Technetium-1mMDP IV COMPARISON:  None. FINDINGS: Two foci of uptake are seen in the mid mandible in the left posterior mandible. Uptake at the sternomanubrial junction is within normal limits. Uptake is seen in a right posterior rib., somewhat linear in nature. No other abnormal bony or soft tissue uptake. IMPRESSION: Somewhat linear uptake in a right posterior rib is identified. Given the lack of other abnormalities, this finding has less than a 10% chance of representing metastatic disease. However, recommend dedicated anatomic imaging for further evaluation. Odontogenic uptake in the mandible. No other abnormalities. Electronically Signed   By: DDorise BullionIII M.D   On: 07/10/2020 16:48

## 2020-08-07 ENCOUNTER — Ambulatory Visit: Payer: Managed Care, Other (non HMO)

## 2020-08-07 ENCOUNTER — Encounter: Payer: Self-pay | Admitting: Hematology and Oncology

## 2020-08-11 ENCOUNTER — Encounter: Payer: Self-pay | Admitting: Hematology and Oncology

## 2020-08-16 ENCOUNTER — Ambulatory Visit: Payer: Managed Care, Other (non HMO) | Admitting: Hematology and Oncology

## 2020-08-25 ENCOUNTER — Encounter: Payer: Self-pay | Admitting: Hematology and Oncology

## 2020-08-25 NOTE — Progress Notes (Signed)
Patient Care Team: Patient, [name] (Inactive) as PCP - General (General Practice) [name], RN as Oncology Nurse Navigator [name], RN as Oncology Nurse Navigator  DIAGNOSIS:    ICD-10-CM   1. Malignant neoplasm of upper-outer quadrant of right breast in female, estrogen receptor positive ([place])  C50.411    Z17.0     SUMMARY OF ONCOLOGIC HISTORY: Oncology History  Malignant neoplasm of upper-outer quadrant of right breast in female, estrogen receptor positive ([place])  06/03/2020 Mammogram   06/03/2020, US guided biopsy of the right breast 9 0 clock mass showed grade III IDC; Prognostics showed ER 40% positive, weak staining, PR positive 0 %, Her 2 3 +, Ki 67 30%   06/04/2020 Initial Diagnosis   [id number] 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 palpable right breast mass measuring about 7 cm x 5 and half centimeters, palpable right axillary lymph node with concern for skin invasion    06/08/2020 Cancer Staging   Staging form: Breast, AJCC 8th Edition - Clinical: Stage IIIB (cT4d, cN1, cM0, G3, ER+, PR-, HER2+) - Signed by [name], MD on 07/07/2020 Histologic grading system: 3 grade system   06/24/2020 Genetic Testing   No pathogenic variants detected in [facility] +RNA[facility].  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  The report date is June 24, 2020.   The CustomNext-Cancer+RNAinsight panel offered by [facility] includes sequencing and rearrangement analysis for the following 47 genes:  APC, ATM, AXIN2, BARD1, BMPR1A, BRCA1, BRCA2, BRIP1, CDH1, CDK4, CDKN2A, CHEK2, DICER1, EPCAM, GREM1, HOXB13, MEN1, MLH1, MSH2, MSH3, MSH6, MUTYH, NBN, NF1, NF2, NTHL1, PALB2, PMS2, POLD1, POLE, PTEN, RAD51C, RAD51D, RECQL, RET, SDHA, SDHAF2, SDHB, SDHC, SDHD, SMAD4, SMARCA4, STK11, TP53, TSC1, TSC2, and VHL.  RNA data is routinely analyzed for use in variant interpretation for all genes.   06/25/2020 -  Chemotherapy     Patient is on Treatment Plan: BREAST  DOCETAXEL + CARBOPLATIN + TRASTUZUMAB + PERTUZUMAB  (TCHP) Q21D         CHIEF COMPLIANT: Cycle 4 Day 1 TCHP  INTERVAL HISTORY: THOMASENE DUBOW is a 45 y.o. with above-mentioned history of HER-2 positive right breast cancercurrently on neoadjuvant chemotherapy with TCH Perjeta, but Perjeta and Taxotere have been discontinued due to gastrointestinal side effects.She presents to the clinic todayfor a toxicity check.    She had 1 episode of bloody stool but otherwise did quite well when she received 30 mg per metered square of Taxotere.  So today we will increase the dosage to 50 mg per metered square.  We will still not do Perjeta because of her diarrhea issues.  She did have more fatigue than usual but otherwise did quite well.  She notices that the inflammatory changes in the breast have all resolved.  ALLERGIES:  is allergic to other.  MEDICATIONS:  Current Outpatient Medications  Medication Sig Dispense Refill  . dexamethasone (DECADRON) 4 MG tablet Take 2 tablets (8 mg total) by mouth 2 (two) times daily. Start the day before Taxotere. Then take daily x 3 days after chemotherapy. 30 tablet 1  . diphenoxylate-atropine (LOMOTIL) 2.5-0.025 MG tablet 1 to 2 tablets before meals and also use at bedtime if needed 90 tablet 1  . ferrous sulfate 325 (65 FE) MG tablet Take 325 mg by mouth daily with breakfast.    . lidocaine-prilocaine (EMLA) cream Apply 1 application topically as needed (Prior to port access). 30 g 2  . loratadine (CLARITIN) 10  MG tablet Take 10 mg by mouth See admin instructions. Take 7 days starting the day of injection    . LORazepam (ATIVAN) 0.5 MG tablet Take 1 tablet (0.5 mg total) by mouth every 6 (six) hours as needed (Nausea or vomiting). 30 tablet 0  . mesalamine (LIALDA) 1.2 g EC tablet Take 2 tablets (2.4 g total) by mouth 2 (two) times daily with a meal. 120 tablet 11  . Multiple Vitamin (MULTIVITAMIN) tablet Take 1 tablet by  mouth daily.    . ondansetron (ZOFRAN) 8 MG tablet Take 1 tablet (8 mg total) by mouth 2 (two) times daily as needed (Nausea or vomiting). Start on the third day after chemotherapy. 30 tablet 1  . prochlorperazine (COMPAZINE) 10 MG tablet Take 1 tablet (10 mg total) by mouth every 6 (six) hours as needed (Nausea or vomiting). 30 tablet 1  . traMADol (ULTRAM) 50 MG tablet Take 1 tablet (50 mg total) by mouth every 6 (six) hours as needed for severe pain. 30 tablet 0   No current facility-administered medications for this visit.    PHYSICAL EXAMINATION: ECOG PERFORMANCE STATUS: 1 - Symptomatic but completely ambulatory  Vitals:   08/26/20 0904  BP: (!) 143/92  Pulse: (!) 110  Resp: 16  Temp: (!) 97.3 F (36.3 C)  SpO2: 100%   Filed Weights   08/26/20 0904  Weight: 147 lb 6.4 oz (66.9 kg)     LABORATORY DATA:  I have reviewed the data as listed CMP Latest Ref Rng & Units 08/05/2020 07/15/2020 07/01/2020  Glucose 70 - 99 mg/dL 128(H) 112(H) 99  BUN 6 - 20 mg/dL _0 Creatinine 0.44 - 1.00 mg/dL 0.77 0.73 0.81  Sodium 135 - 145 mmol/L 140 141 135  Potassium 3.5 - 5.1 mmol/L 3.8 3.8 3.9  Chloride 98 - 111 mmol/L 105 108 101  CO2 22 - 32 mmol/L _1 Calcium 8.9 - 10.3 mg/dL 9.3 8.4(L) 8.9  Total Protein 6.5 - 8.1 g/dL 7.9 6.5 -  Total Bilirubin 0.3 - 1.2 mg/dL 0.4 <0.2(L) -  Alkaline Phos 38 - 126 U/L 115 105 -  AST 15 - 41 U/L 15 13(L) -  ALT 0 - 44 U/L 14 10 -    Lab Results  Component Value Date   WBC 6.2 08/26/2020   HGB 9.6 (L) 08/26/2020   HCT 29.7 (L) 08/26/2020   MCV 85.8 08/26/2020   PLT 111 (L) 08/26/2020   NEUTROABS 5.0 08/26/2020    ASSESSMENT & PLAN:  Malignant neoplasm of upper-outer quadrant of right breast in female, estrogen receptor positive ([place]) 06/03/2020, US guided biopsy of the right breast 9 0 clock mass showed grade III IDC; Prognostics showed ER 40% positive, weak staining, PR positive 0 %, Her 2 3 +, Ki 67 30% 4dCN1M0 Inflammatory  breast cancer of the right breast. palpable right breast mass measuring about 7 cm x 5 and half centimeters, palpable right axillary lymph node with concern for skin invasion  Treatment Plan: 1. Neoadjuvant chemo with TCHP q 3 weeks X 6 followed by HP vs Kadcyla 2. Mastectomy with TAD 3. Adj XRT 4. Adj Anti-estrogen therapy 5. Neratinib --------------------------------------------------------------------------------------------------------- Current Treatment: neoadj TCHP started 06/25/20, Today is cycle 4 (taxotere and Perjeta were held for cycle 2), Taxotere at a reduced dose added for cycle 3  Chemo Toxicities: 1. Bloody Diarrhea:Much improved 2. Hospitalization 3/23-3/25/22:History ofSub total colectomy, Crohns disease, Iron deficiency Anemia 3. Bone Pain: Due to Neulasta which is being  discontinued  Iron Deficiency Anemia: Today's hemoglobin is 9.6 and it is stable.  Plan: Increase the dose of Taxotere to 50 mg/m2 Slightly decrease the dosage of carboplatin We are still holding off on giving her Neulasta injection at this time.  We will plan to get a breast MRI after 6 cycles of chemo. RTC in 3 weeks for cycle 5    No orders of the defined types were placed in this encounter.  The patient has a good understanding of the overall plan. she agrees with it. she will call with any problems that may develop before the next visit here.  Total time spent: 30 mins including face to face time and time spent for planning, charting and coordination of care  Rulon Eisenmenger, MD, MPH 08/26/2020  I, Cloyde Reams Dorshimer, am acting as scribe for Dr. [name].  I have reviewed the above documentation for accuracy and completeness, and I agree with the above.

## 2020-08-25 NOTE — Assessment & Plan Note (Signed)
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 palpable right breast mass measuring about 7 cm x 5 and half centimeters, palpable right axillary lymph node with concern for skin invasion  Treatment Plan: 1. Neoadjuvant chemo with TCHP q 3 weeks X 6 followed by HP vs Kadcyla 2. Mastectomy with TAD 3. Adj XRT 4. Adj Anti-estrogen therapy 5. Neratinib --------------------------------------------------------------------------------------------------------- Current Treatment: neoadj TCHP started 06/25/20, Today is cycle 4 (taxotere and Perjeta were held for cycle 2), Taxotere at a reduced dose added for cycle 3  Chemo Toxicities: 1. Bloody Diarrhea:Much improved 2. Hospitalization 3/23-3/25/22:History ofSub total colectomy, Crohns disease, Iron deficiency Anemia 3. Bone Pain: Due to Neulasta which is being discontinued  Iron Deficiency Anemia:  Plan: Increase the dose of Taxotere to 50 mg/m2 RTC in 3 weeks for cycle 5

## 2020-08-26 ENCOUNTER — Other Ambulatory Visit: Payer: Managed Care, Other (non HMO)

## 2020-08-26 ENCOUNTER — Encounter: Payer: Self-pay | Admitting: *Deleted

## 2020-08-26 ENCOUNTER — Inpatient Hospital Stay: Payer: Managed Care, Other (non HMO) | Attending: Hematology and Oncology

## 2020-08-26 ENCOUNTER — Ambulatory Visit: Payer: Managed Care, Other (non HMO) | Admitting: Hematology and Oncology

## 2020-08-26 ENCOUNTER — Inpatient Hospital Stay: Payer: Managed Care, Other (non HMO)

## 2020-08-26 ENCOUNTER — Other Ambulatory Visit: Payer: Self-pay

## 2020-08-26 ENCOUNTER — Inpatient Hospital Stay: Payer: Managed Care, Other (non HMO) | Admitting: Hematology and Oncology

## 2020-08-26 ENCOUNTER — Encounter: Payer: Self-pay | Admitting: Hematology and Oncology

## 2020-08-26 VITALS — BP 135/90 | HR 103 | Temp 98.1°F | Resp 18

## 2020-08-26 DIAGNOSIS — C50411 Malignant neoplasm of upper-outer quadrant of right female breast: Secondary | ICD-10-CM

## 2020-08-26 DIAGNOSIS — Z95828 Presence of other vascular implants and grafts: Secondary | ICD-10-CM

## 2020-08-26 DIAGNOSIS — D509 Iron deficiency anemia, unspecified: Secondary | ICD-10-CM | POA: Diagnosis not present

## 2020-08-26 DIAGNOSIS — Z170 Estrogen receptor positive status [ER+]: Secondary | ICD-10-CM | POA: Diagnosis not present

## 2020-08-26 DIAGNOSIS — Z5111 Encounter for antineoplastic chemotherapy: Secondary | ICD-10-CM | POA: Diagnosis present

## 2020-08-26 DIAGNOSIS — Z5112 Encounter for antineoplastic immunotherapy: Secondary | ICD-10-CM | POA: Insufficient documentation

## 2020-08-26 DIAGNOSIS — D500 Iron deficiency anemia secondary to blood loss (chronic): Secondary | ICD-10-CM

## 2020-08-26 LAB — CBC WITH DIFFERENTIAL/PLATELET
Abs Immature Granulocytes: 0.03 10*3/uL (ref 0.00–0.07)
Basophils Absolute: 0 10*3/uL (ref 0.0–0.1)
Basophils Relative: 0 %
Eosinophils Absolute: 0 10*3/uL (ref 0.0–0.5)
Eosinophils Relative: 0 %
HCT: 29.7 % — ABNORMAL LOW (ref 36.0–46.0)
Hemoglobin: 9.6 g/dL — ABNORMAL LOW (ref 12.0–15.0)
Immature Granulocytes: 1 %
Lymphocytes Relative: 13 %
Lymphs Abs: 0.8 10*3/uL (ref 0.7–4.0)
MCH: 27.7 pg (ref 26.0–34.0)
MCHC: 32.3 g/dL (ref 30.0–36.0)
MCV: 85.8 fL (ref 80.0–100.0)
Monocytes Absolute: 0.4 10*3/uL (ref 0.1–1.0)
Monocytes Relative: 6 %
Neutro Abs: 5 10*3/uL (ref 1.7–7.7)
Neutrophils Relative %: 80 %
Platelets: 111 10*3/uL — ABNORMAL LOW (ref 150–400)
RBC: 3.46 MIL/uL — ABNORMAL LOW (ref 3.87–5.11)
RDW: 17.6 % — ABNORMAL HIGH (ref 11.5–15.5)
WBC: 6.2 10*3/uL (ref 4.0–10.5)
nRBC: 0 % (ref 0.0–0.2)

## 2020-08-26 LAB — COMPREHENSIVE METABOLIC PANEL
ALT: 13 U/L (ref 0–44)
AST: 15 U/L (ref 15–41)
Albumin: 3.7 g/dL (ref 3.5–5.0)
Alkaline Phosphatase: 111 U/L (ref 38–126)
Anion gap: 12 (ref 5–15)
BUN: 10 mg/dL (ref 6–20)
CO2: 25 mmol/L (ref 22–32)
Calcium: 9.6 mg/dL (ref 8.9–10.3)
Chloride: 101 mmol/L (ref 98–111)
Creatinine, Ser: 0.78 mg/dL (ref 0.44–1.00)
GFR, Estimated: >60 mL/min (ref >60)
Glucose, Bld: 153 mg/dL — ABNORMAL HIGH (ref 70–99)
Potassium: 3.5 mmol/L (ref 3.5–5.1)
Sodium: 138 mmol/L (ref 135–145)
Total Bilirubin: 0.4 mg/dL (ref 0.3–1.2)
Total Protein: 7.8 g/dL (ref 6.5–8.1)

## 2020-08-26 LAB — IRON AND TIBC
Iron: 85 ug/dL (ref 41–142)
Saturation Ratios: 29 % (ref 21–57)
TIBC: 296 ug/dL (ref 236–444)
UIBC: 212 ug/dL (ref 120–384)

## 2020-08-26 LAB — MAGNESIUM: Magnesium: 1.7 mg/dL (ref 1.7–2.4)

## 2020-08-26 LAB — FERRITIN: Ferritin: 82 ng/mL (ref 11–307)

## 2020-08-26 MED ORDER — SODIUM CHLORIDE 0.9 % IV SOLN
500.0000 mg | Freq: Once | INTRAVENOUS | Status: AC
  Administered 2020-08-26: 500 mg via INTRAVENOUS
  Filled 2020-08-26: qty 50

## 2020-08-26 MED ORDER — ACETAMINOPHEN 325 MG PO TABS
ORAL_TABLET | ORAL | Status: AC
  Filled 2020-08-26: qty 2

## 2020-08-26 MED ORDER — SODIUM CHLORIDE 0.9 % IV SOLN
10.0000 mg | Freq: Once | INTRAVENOUS | Status: AC
  Administered 2020-08-26: 10 mg via INTRAVENOUS
  Filled 2020-08-26: qty 10

## 2020-08-26 MED ORDER — ACETAMINOPHEN 325 MG PO TABS
650.0000 mg | ORAL_TABLET | Freq: Once | ORAL | Status: AC
  Administered 2020-08-26: 650 mg via ORAL

## 2020-08-26 MED ORDER — SODIUM CHLORIDE 0.9 % IV SOLN
420.0000 mg | Freq: Once | INTRAVENOUS | Status: AC
  Administered 2020-08-26: 420 mg via INTRAVENOUS
  Filled 2020-08-26: qty 14

## 2020-08-26 MED ORDER — SODIUM CHLORIDE 0.9% FLUSH
10.0000 mL | INTRAVENOUS | Status: DC | PRN
  Administered 2020-08-26: 10 mL
  Filled 2020-08-26: qty 10

## 2020-08-26 MED ORDER — PALONOSETRON HCL INJECTION 0.25 MG/5ML
0.2500 mg | Freq: Once | INTRAVENOUS | Status: AC
Start: 2020-08-26 — End: 2020-08-26
  Administered 2020-08-26: 0.25 mg via INTRAVENOUS

## 2020-08-26 MED ORDER — SODIUM CHLORIDE 0.9 % IV SOLN
Freq: Once | INTRAVENOUS | Status: AC
  Filled 2020-08-26: qty 250

## 2020-08-26 MED ORDER — HEPARIN SOD (PORK) LOCK FLUSH 100 UNIT/ML IV SOLN
500.0000 [IU] | Freq: Once | INTRAVENOUS | Status: AC | PRN
  Administered 2020-08-26: 500 [IU]
  Filled 2020-08-26: qty 5

## 2020-08-26 MED ORDER — SODIUM CHLORIDE 0.9 % IV SOLN
150.0000 mg | Freq: Once | INTRAVENOUS | Status: AC
  Administered 2020-08-26: 150 mg via INTRAVENOUS
  Filled 2020-08-26: qty 150

## 2020-08-26 MED ORDER — SODIUM CHLORIDE 0.9% FLUSH
10.0000 mL | Freq: Once | INTRAVENOUS | Status: AC
  Administered 2020-08-26: 10 mL
  Filled 2020-08-26: qty 10

## 2020-08-26 MED ORDER — DIPHENHYDRAMINE HCL 25 MG PO CAPS
ORAL_CAPSULE | ORAL | Status: AC
  Filled 2020-08-26: qty 2

## 2020-08-26 MED ORDER — SODIUM CHLORIDE 0.9 % IV SOLN
50.0000 mg/m2 | Freq: Once | INTRAVENOUS | Status: AC
  Administered 2020-08-26: 90 mg via INTRAVENOUS
  Filled 2020-08-26: qty 9

## 2020-08-26 MED ORDER — DIPHENHYDRAMINE HCL 25 MG PO CAPS
50.0000 mg | ORAL_CAPSULE | Freq: Once | ORAL | Status: AC
  Administered 2020-08-26: 50 mg via ORAL

## 2020-08-26 MED ORDER — TRASTUZUMAB-ANNS CHEMO 150 MG IV SOLR
6.0000 mg/kg | Freq: Once | INTRAVENOUS | Status: AC
  Administered 2020-08-26: 420 mg via INTRAVENOUS
  Filled 2020-08-26: qty 20

## 2020-08-26 MED ORDER — HEPARIN SOD (PORK) LOCK FLUSH 100 UNIT/ML IV SOLN
500.0000 [IU] | Freq: Once | INTRAVENOUS | Status: AC
  Administered 2020-08-26: 500 [IU]
  Filled 2020-08-26: qty 5

## 2020-08-26 MED ORDER — PALONOSETRON HCL INJECTION 0.25 MG/5ML
INTRAVENOUS | Status: AC
  Filled 2020-08-26: qty 5

## 2020-08-26 NOTE — Patient Instructions (Signed)
[facility]    Discharge Instructions:  Thank you for choosing [facility] to provide your oncology and hematology care.   If you have a lab appointment with the [facility], please go directly to the [facility] and check in at the registration area.   Wear comfortable clothing and clothing appropriate for easy access to any Portacath or PICC line.   We strive to give you quality time with your provider. You may need to reschedule your appointment if you arrive late (15 or more minutes).  Arriving late affects you and other patients whose appointments are after yours.  Also, if you miss three or more appointments without notifying the office, you may be dismissed from the clinic at the provider's discretion.      For prescription refill requests, have your pharmacy contact our office and allow 72 hours for refills to be completed.    Today you received the following chemotherapy and/or immunotherapy agents:  trastuzumab-anns, docetaxel, and carboplatin and perjeta   To help prevent nausea and vomiting after your treatment, we encourage you to take your nausea medication as directed.  BELOW ARE SYMPTOMS THAT SHOULD BE REPORTED IMMEDIATELY: . *FEVER GREATER THAN 100.4 F (38 C) OR HIGHER . *CHILLS OR SWEATING . *NAUSEA AND VOMITING THAT IS NOT CONTROLLED WITH YOUR NAUSEA MEDICATION . *UNUSUAL SHORTNESS OF BREATH . *UNUSUAL BRUISING OR BLEEDING . *URINARY PROBLEMS (pain or burning when urinating, or frequent urination) . *BOWEL PROBLEMS (unusual diarrhea, constipation, pain near the anus) . TENDERNESS IN MOUTH AND THROAT WITH OR WITHOUT PRESENCE OF ULCERS (sore throat, sores in mouth, or a toothache) . UNUSUAL RASH, SWELLING OR PAIN  . UNUSUAL VAGINAL DISCHARGE OR ITCHING   Items with * indicate a potential emergency and should be followed up as soon as possible or go to the Emergency Department if any problems should occur.  Please show  the CHEMOTHERAPY ALERT CARD or IMMUNOTHERAPY ALERT CARD at check-in to the Emergency Department and triage nurse.  Should you have questions after your visit or need to cancel or reschedule your appointment, please contact [facility]  Dept: [phone]  and follow the prompts.  Office hours are 8:00 a.m. to 4:30 p.m. Monday - Friday. Please note that voicemails left after 4:00 p.m. may not be returned until the following business day.  We are closed weekends and major holidays. You have access to a nurse at all times for urgent questions. Please call the main number to the clinic Dept: [phone] and follow the prompts.   For any non-urgent questions, you may also contact your provider using MyChart. We now offer e-Visits for anyone 75 and older to request care online for non-urgent symptoms. For details visit [email or web address].   Also download the MyChart app! Go to the app store, search "MyChart", open the app, select [facility], and log in with your MyChart username and password.  Due to Covid, a mask is required upon entering the hospital/clinic. If you do not have a mask, one will be given to you upon arrival. For doctor visits, patients may have 1 support person aged 22 or older with them. For treatment visits, patients cannot have anyone with them due to current Covid guidelines and our immunocompromised population.

## 2020-08-27 ENCOUNTER — Telehealth: Payer: Self-pay | Admitting: Hematology and Oncology

## 2020-08-27 NOTE — Telephone Encounter (Signed)
Scheduled appointment per 05/19 los. Patient will be given updated calender.

## 2020-08-28 ENCOUNTER — Ambulatory Visit: Payer: Managed Care, Other (non HMO)

## 2020-08-30 ENCOUNTER — Encounter: Payer: Self-pay | Admitting: *Deleted

## 2020-08-30 ENCOUNTER — Telehealth: Payer: Self-pay | Admitting: *Deleted

## 2020-08-30 DIAGNOSIS — Z170 Estrogen receptor positive status [ER+]: Secondary | ICD-10-CM

## 2020-08-30 NOTE — Telephone Encounter (Signed)
Confirmed appt with [name] for 6/22 at 11:15.  Pt would like to consider bil mast with reconstruction. Referral placed for pt to see DR. Pace. Denies further needs or questions at this time.

## 2020-09-01 ENCOUNTER — Ambulatory Visit (INDEPENDENT_AMBULATORY_CARE_PROVIDER_SITE_OTHER): Payer: Managed Care, Other (non HMO) | Admitting: Plastic Surgery

## 2020-09-01 ENCOUNTER — Other Ambulatory Visit: Payer: Self-pay

## 2020-09-01 ENCOUNTER — Encounter: Payer: Self-pay | Admitting: Plastic Surgery

## 2020-09-01 VITALS — BP 146/95 | HR 115 | Ht 64.0 in | Wt 145.8 lb

## 2020-09-01 DIAGNOSIS — Z170 Estrogen receptor positive status [ER+]: Secondary | ICD-10-CM | POA: Diagnosis not present

## 2020-09-01 DIAGNOSIS — C50411 Malignant neoplasm of upper-outer quadrant of right female breast: Secondary | ICD-10-CM | POA: Diagnosis not present

## 2020-09-01 NOTE — Progress Notes (Signed)
Referring Provider [name], MD [place],  [place] [id number]   CC:  Chief Complaint  Patient presents with  . consult      [name] is an 45 y.o. female.  HPI: Patient presents to discuss breast reconstruction.  She found a lump in her right breast several months ago.  This was then evaluated with mammogram and core biopsy showing her right-sided breast cancer.  She is undergoing neoadjuvant chemotherapy which should finish at the end of June.  She has yet to have a formal discussion about her planned cancer surgery but is seemingly leaning towards bilateral mastectomies.  She wants to discuss her reconstructive options.  She does not smoke and is not a good diabetic and has very few health issues with her cancer.  She is currently a B cup.  Allergies  Allergen Reactions  . Other     BLOOD PRODUCT REFUSAL    Outpatient Encounter Medications as of 09/01/2020  Medication Sig  . dexamethasone (DECADRON) 4 MG tablet Take 2 tablets (8 mg total) by mouth 2 (two) times daily. Start the day before Taxotere. Then take daily x 3 days after chemotherapy.  . diphenoxylate-atropine (LOMOTIL) 2.5-0.025 MG tablet 1 to 2 tablets before meals and also use at bedtime if needed  . ferrous sulfate 325 (65 FE) MG tablet Take 325 mg by mouth daily with breakfast.  . lidocaine-prilocaine (EMLA) cream Apply 1 application topically as needed (Prior to port access).  Marland Kitchen loratadine (CLARITIN) 10 MG tablet Take 10 mg by mouth See admin instructions. Take 7 days starting the day of injection  . LORazepam (ATIVAN) 0.5 MG tablet Take 1 tablet (0.5 mg total) by mouth every 6 (six) hours as needed (Nausea or vomiting).  . mesalamine (LIALDA) 1.2 g EC tablet Take 2 tablets (2.4 g total) by mouth 2 (two) times daily with a meal.  . Multiple Vitamin (MULTIVITAMIN) tablet Take 1 tablet by mouth daily.  . ondansetron (ZOFRAN) 8 MG tablet Take 1 tablet (8 mg total) by mouth 2 (two)  times daily as needed (Nausea or vomiting). Start on the third day after chemotherapy.  . prochlorperazine (COMPAZINE) 10 MG tablet Take 1 tablet (10 mg total) by mouth every 6 (six) hours as needed (Nausea or vomiting).  . traMADol (ULTRAM) 50 MG tablet Take 1 tablet (50 mg total) by mouth every 6 (six) hours as needed for severe pain.   No facility-administered encounter medications on file as of 09/01/2020.     Past Medical History:  Diagnosis Date  . 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 Crohn's disease ([place])   . Family history of breast cancer 06/11/2020  . 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 Rectal bleeding   . Uterine fibroid   . Vitamin D deficiency 09/24/2013    Past Surgical History:  Procedure Laterality Date  . BIOPSY  07/01/2020   Procedure: BIOPSY;  Surgeon: Yetta Flock, MD;  Location: [facility];  Service: Gastroenterology;;  . [name]  06/27/2019  . COLONOSCOPY  2015  . FLEXIBLE SIGMOIDOSCOPY N/A 07/01/2020   Procedure: FLEXIBLE SIGMOIDOSCOPY;  Surgeon: Yetta Flock, MD;  Location: [facility];  Service: Gastroenterology;  Laterality: N/A;  . PORTACATH PLACEMENT N/A 06/23/2020   Procedure: INSERTION PORT-A-CATH;  Surgeon: [name], MD;  Location: [facility];  Service: General;  Laterality: N/A;  START TIME OF 11:00 AM FOR 60 MINUTES [place] IQ  Family History  Problem Relation Age of Onset  . Breast cancer Maternal Aunt        dx unknown age  . Diabetes Father   . Diabetes Paternal Aunt   . Breast cancer Cousin        maternal cousin; dx unknown age  . Esophageal cancer Neg Hx   . Rectal cancer Neg Hx   . Stomach cancer Neg Hx   . Colon polyps Neg Hx   . Colon cancer Neg Hx     Social History   Social History Narrative   Single, 1 daughter born 2001 approximately   Careers information officer for [employer]   She is a former smoker, occasional alcohol no drug use     Review  of Systems General: Denies fevers, chills, weight loss CV: Denies chest pain, shortness of breath, palpitations  Physical Exam Vitals with BMI 09/01/2020 08/26/2020 08/26/2020  Height 5' 4"  - -  Weight 145 lbs 13 oz - -  BMI 36.14 - -  Systolic 431 540 -  Diastolic 95 90 -  Pulse 086 103 99    General:  No acute distress,  Alert and oriented, Non-Toxic, Normal speech and affect Breast: She has very little ptosis.  She is around a B cup in size.  She has a Scar and palpable port in the left upper chest.  She is reasonably symmetric at baseline.  Assessment/Plan Had a long discussion with the patient about her options for breast reconstruction.  She had done some research and seemed most interested in implant-based reconstruction and wanted to avoid autologous flaps if possible.  I discussed the tissue expander to implant process.  She wants to be a little bit bigger than she is now.  We discussed placement of the expander at the time of the mastectomy followed by expansion subsequently switching her to a gel implant.  We discussed the risks of the procedure that include bleeding, infection, damage to surrounding structures and need for further surgery.  We discussed the potential for bleeding complications after surgery that might affect whether or not the implant can remain in place.  We discussed the need for drains postoperatively.  We discussed that the postop mastectomy radiation ends up being required this may impact the outcome of her reconstruction.  All her questions were answered and we will plan to follow-up with her discussion with [name] and plan accordingly.  All of her questions were answered.  [name] 09/01/2020, 4:56 PM

## 2020-09-14 ENCOUNTER — Ambulatory Visit: Payer: Managed Care, Other (non HMO) | Admitting: Hematology and Oncology

## 2020-09-15 ENCOUNTER — Other Ambulatory Visit: Payer: Self-pay | Admitting: *Deleted

## 2020-09-15 DIAGNOSIS — Z79899 Other long term (current) drug therapy: Secondary | ICD-10-CM

## 2020-09-15 NOTE — Progress Notes (Signed)
Patient Care Team: Patient, [name] (Inactive) as PCP - General (General Practice) [name], RN as Oncology Nurse Navigator [name], RN as Oncology Nurse Navigator  DIAGNOSIS:    ICD-10-CM   1. 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 palpable right breast mass measuring about 7 cm x 5 and half centimeters, palpable right axillary lymph node with concern for skin invasion    06/08/2020 Cancer Staging   Staging form: Breast, AJCC 8th Edition - Clinical: Stage IIIB (cT4d, cN1, cM0, G3, ER+, PR-, HER2+) - Signed by [name], MD on 07/07/2020  Histologic grading system: 3 grade system    06/24/2020 Genetic Testing   No pathogenic variants detected in [facility] +RNA[facility]ight.  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  The report date is June 24, 2020.   The CustomNext-Cancer+RNAinsight panel offered by [facility] includes sequencing and rearrangement analysis for the following 47 genes:  APC, ATM, AXIN2, BARD1, BMPR1A, BRCA1, BRCA2, BRIP1, CDH1, CDK4, CDKN2A, CHEK2, DICER1, EPCAM, GREM1, HOXB13, MEN1, MLH1, MSH2, MSH3, MSH6, MUTYH, NBN, NF1, NF2, NTHL1, PALB2, PMS2, POLD1, POLE, PTEN, RAD51C, RAD51D, RECQL, RET, SDHA, SDHAF2, SDHB, SDHC, SDHD, SMAD4, SMARCA4, STK11, TP53, TSC1, TSC2, and VHL.  RNA data is routinely analyzed for use in variant interpretation for all genes.   06/25/2020 -  Chemotherapy     Patient is on Treatment Plan: BREAST  DOCETAXEL + CARBOPLATIN + TRASTUZUMAB + PERTUZUMAB  (TCHP) Q21D          CHIEF COMPLIANT:  Cycle 5 Day 1 TCHP  INTERVAL HISTORY: GIRTHA KILGORE is a 45 y.o. with above-mentioned history of HER-2 positive right breast cancer currently on neoadjuvant chemotherapy with TCH Perjeta, but Perjeta and Taxotere have been discontinued due to gastrointestinal side effects. She presents to the clinic today for a toxicity check and treatment.  She had side effects of treatment which included diarrhea 3-4 loose stools per day but she does not want to take any antidiarrheal medication.  She also noticed to have bright red blood per rectum which we felt were related to internal hemorrhoids.  ALLERGIES:  is allergic to other.  MEDICATIONS:  Current Outpatient Medications  Medication Sig Dispense Refill   dexamethasone (DECADRON) 4 MG tablet Take 2 tablets (8 mg total) by mouth 2 (two) times daily. Start the day before Taxotere. Then take daily x 3 days after chemotherapy. 30 tablet 1   diphenoxylate-atropine (LOMOTIL) 2.5-0.025 MG tablet 1 to 2 tablets before meals and also use at bedtime if needed 90 tablet 1   ferrous sulfate 325 (65 FE) MG tablet Take 325 mg by mouth daily with breakfast.     lidocaine-prilocaine (EMLA) cream Apply 1 application topically as needed (Prior to port access). 30 g 2   loratadine (CLARITIN) 10 MG tablet Take 10 mg by mouth See admin instructions. Take 7 days starting the day of injection  LORazepam (ATIVAN) 0.5 MG tablet Take 1 tablet (0.5 mg total) by mouth every 6 (six) hours as needed (Nausea or vomiting). 30 tablet 0   mesalamine (LIALDA) 1.2 g EC tablet Take 2 tablets (2.4 g total) by mouth 2 (two) times daily with a meal. 120 tablet 11   Multiple Vitamin (MULTIVITAMIN) tablet Take 1 tablet by mouth daily.     ondansetron (ZOFRAN) 8 MG tablet Take 1 tablet (8 mg total) by mouth 2 (two) times daily as needed (Nausea or vomiting).  Start on the third day after chemotherapy. 30 tablet 1   prochlorperazine (COMPAZINE) 10 MG tablet Take 1 tablet (10 mg total) by mouth every 6 (six) hours as needed (Nausea or vomiting). 30 tablet 1   traMADol (ULTRAM) 50 MG tablet Take 1 tablet (50 mg total) by mouth every 6 (six) hours as needed for severe pain. 30 tablet 0   No current facility-administered medications for this visit.    PHYSICAL EXAMINATION: ECOG PERFORMANCE STATUS: 1 - Symptomatic but completely ambulatory  Vitals:   09/16/20 0843  BP: (!) 143/94  Pulse: (!) 113  Resp: 18  Temp: (!) 97.4 F (36.3 C)  SpO2: 100%   Filed Weights   09/16/20 0843  Weight: 146 lb 9.6 oz (66.5 kg)    LABORATORY DATA:  I have reviewed the data as listed CMP Latest Ref Rng & Units 08/26/2020 08/05/2020 07/15/2020  Glucose 70 - 99 mg/dL 153(H) 128(H) 112(H)  BUN 6 - 20 mg/dL 10 10 8   Creatinine 0.44 - 1.00 mg/dL 0.78 0.77 0.73  Sodium 135 - 145 mmol/L 138 140 141  Potassium 3.5 - 5.1 mmol/L 3.5 3.8 3.8  Chloride 98 - 111 mmol/L 101 105 108  CO2 22 - 32 mmol/L 25 23 23   Calcium 8.9 - 10.3 mg/dL 9.6 9.3 8.4(L)  Total Protein 6.5 - 8.1 g/dL 7.8 7.9 6.5  Total Bilirubin 0.3 - 1.2 mg/dL 0.4 0.4 <0.2(L)  Alkaline Phos 38 - 126 U/L 111 115 105  AST 15 - 41 U/L 15 15 13(L)  ALT 0 - 44 U/L 13 14 10     Lab Results  Component Value Date   WBC 13.5 (H) 09/16/2020   HGB 9.4 (L) 09/16/2020   HCT 29.5 (L) 09/16/2020   MCV 86.5 09/16/2020   PLT 186 09/16/2020   NEUTROABS 12.2 (H) 09/16/2020    ASSESSMENT & PLAN:  Malignant neoplasm of upper-outer quadrant of right breast in female, estrogen receptor positive ([place]) 06/03/2020, US guided biopsy of the right breast 9 0 clock mass showed grade III IDC; Prognostics showed ER 40% positive, weak staining, PR positive 0 %, Her 2 3 +, Ki 67 30% 4dCN1M0 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 palpable right breast mass measuring about 7 cm x 5 and half centimeters, palpable right  axillary lymph node with concern for skin invasion    Treatment Plan: 1. Neoadjuvant chemo with TCHP q 3 weeks X 6 followed by HP vs Kadcyla 2. Mastectomy with TAD 3. Adj XRT 4. Adj Anti-estrogen therapy 5. Neratinib --------------------------------------------------------------------------------------------------------- Current Treatment : neoadj TCHP started 06/25/20, Today is cycle 5 (taxotere and Perjeta were held for cycle 2), Taxotere at a reduced dose added for cycle 3   Chemo Toxicities: 1. Bloody Diarrhea: Much improved, could be related to hemorrhoids 2. Hospitalization 3/23-3/25/22: History of Sub total colectomy, Crohns disease, Iron deficiency Anemia 3. Bone Pain: Due to Neulasta which is being discontinued   Iron Deficiency Anemia: Today's hemoglobin is 9.4  and it is stable.   Plan: Increase the dose of Taxotere to 50 mg/m2 Slightly decrease the dosage of carboplatin We are still holding off on giving her Neulasta injection at this time.   We will plan to get a breast MRI after 6 cycles of chemo. I discussed with her about the need for radiation and the need for antiestrogen therapy and anti-HER2 maintenance therapy. RTC in 3 weeks for cycle 6      No orders of the defined types were placed in this encounter.  The patient has a good understanding of the overall plan. she agrees with it. she will call with any problems that may develop before the next visit here.  Total time spent: 30 mins including face to face time and time spent for planning, charting and coordination of care  Rulon Eisenmenger, MD, MPH 09/16/2020  I, Cloyde Reams Dorshimer, am acting as scribe for Dr. [name].  I have reviewed the above documentation for accuracy and completeness, and I agree with the above.

## 2020-09-15 NOTE — Assessment & Plan Note (Signed)
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 palpable right breast mass measuring about 7 cm x 5 and half centimeters, palpable right axillary lymph node with concern for skin invasion  Treatment Plan: 1. Neoadjuvant chemo with TCHP q 3 weeks X 6 followed by HP vs Kadcyla 2. Mastectomy with TAD 3. Adj XRT 4. Adj Anti-estrogen therapy 5. Neratinib --------------------------------------------------------------------------------------------------------- Current Treatment: neoadj TCHP started 06/25/20, Today is cycle 5 (taxotere and Perjetawereheld for cycle 2), Taxotere at a reduced dose added for cycle 3  Chemo Toxicities: 1. Bloody Diarrhea:Much improved 2. Hospitalization 3/23-3/25/22:History ofSub total colectomy, Crohns disease, Iron deficiency Anemia 3. Bone Pain: Due to Neulasta which is being discontinued  Iron Deficiency Anemia: Today's hemoglobin is 9.6 and it is stable.  Plan: Increase the dose of Taxotere to 50 mg/m2 Slightly decrease the dosage of carboplatin We are still holding off on giving her Neulasta injection at this time.  We will plan to get a breast MRI after 6 cycles of chemo. RTC in 3 weeks for cycle 6

## 2020-09-16 ENCOUNTER — Inpatient Hospital Stay: Payer: Managed Care, Other (non HMO)

## 2020-09-16 ENCOUNTER — Other Ambulatory Visit: Payer: Managed Care, Other (non HMO)

## 2020-09-16 ENCOUNTER — Inpatient Hospital Stay: Payer: Managed Care, Other (non HMO) | Attending: Hematology and Oncology

## 2020-09-16 ENCOUNTER — Inpatient Hospital Stay (HOSPITAL_BASED_OUTPATIENT_CLINIC_OR_DEPARTMENT_OTHER): Payer: Managed Care, Other (non HMO) | Admitting: Hematology and Oncology

## 2020-09-16 ENCOUNTER — Other Ambulatory Visit: Payer: Self-pay

## 2020-09-16 ENCOUNTER — Ambulatory Visit: Payer: Managed Care, Other (non HMO) | Admitting: Hematology and Oncology

## 2020-09-16 VITALS — BP 138/78 | HR 89 | Temp 98.2°F | Resp 18

## 2020-09-16 DIAGNOSIS — Z5111 Encounter for antineoplastic chemotherapy: Secondary | ICD-10-CM | POA: Insufficient documentation

## 2020-09-16 DIAGNOSIS — D509 Iron deficiency anemia, unspecified: Secondary | ICD-10-CM | POA: Diagnosis not present

## 2020-09-16 DIAGNOSIS — Z95828 Presence of other vascular implants and grafts: Secondary | ICD-10-CM

## 2020-09-16 DIAGNOSIS — C50411 Malignant neoplasm of upper-outer quadrant of right female breast: Secondary | ICD-10-CM | POA: Insufficient documentation

## 2020-09-16 DIAGNOSIS — Z170 Estrogen receptor positive status [ER+]: Secondary | ICD-10-CM

## 2020-09-16 DIAGNOSIS — Z79899 Other long term (current) drug therapy: Secondary | ICD-10-CM | POA: Insufficient documentation

## 2020-09-16 DIAGNOSIS — Z5112 Encounter for antineoplastic immunotherapy: Secondary | ICD-10-CM | POA: Insufficient documentation

## 2020-09-16 LAB — COMPREHENSIVE METABOLIC PANEL
ALT: 14 U/L (ref 0–44)
AST: 14 U/L — ABNORMAL LOW (ref 15–41)
Albumin: 3.5 g/dL (ref 3.5–5.0)
Alkaline Phosphatase: 101 U/L (ref 38–126)
Anion gap: 14 (ref 5–15)
BUN: 10 mg/dL (ref 6–20)
CO2: 24 mmol/L (ref 22–32)
Calcium: 10 mg/dL (ref 8.9–10.3)
Chloride: 103 mmol/L (ref 98–111)
Creatinine, Ser: 0.79 mg/dL (ref 0.44–1.00)
GFR, Estimated: >60 mL/min (ref >60)
Glucose, Bld: 185 mg/dL — ABNORMAL HIGH (ref 70–99)
Potassium: 3.6 mmol/L (ref 3.5–5.1)
Sodium: 141 mmol/L (ref 135–145)
Total Bilirubin: 0.3 mg/dL (ref 0.3–1.2)
Total Protein: 7.8 g/dL (ref 6.5–8.1)

## 2020-09-16 LAB — CBC WITH DIFFERENTIAL/PLATELET
Abs Immature Granulocytes: 0.15 10*3/uL — ABNORMAL HIGH (ref 0.00–0.07)
Basophils Absolute: 0 10*3/uL (ref 0.0–0.1)
Basophils Relative: 0 %
Eosinophils Absolute: 0 10*3/uL (ref 0.0–0.5)
Eosinophils Relative: 0 %
HCT: 29.5 % — ABNORMAL LOW (ref 36.0–46.0)
Hemoglobin: 9.4 g/dL — ABNORMAL LOW (ref 12.0–15.0)
Immature Granulocytes: 1 %
Lymphocytes Relative: 7 %
Lymphs Abs: 0.9 10*3/uL (ref 0.7–4.0)
MCH: 27.6 pg (ref 26.0–34.0)
MCHC: 31.9 g/dL (ref 30.0–36.0)
MCV: 86.5 fL (ref 80.0–100.0)
Monocytes Absolute: 0.2 10*3/uL (ref 0.1–1.0)
Monocytes Relative: 2 %
Neutro Abs: 12.2 10*3/uL — ABNORMAL HIGH (ref 1.7–7.7)
Neutrophils Relative %: 90 %
Platelets: 186 10*3/uL (ref 150–400)
RBC: 3.41 MIL/uL — ABNORMAL LOW (ref 3.87–5.11)
RDW: 18 % — ABNORMAL HIGH (ref 11.5–15.5)
WBC: 13.5 10*3/uL — ABNORMAL HIGH (ref 4.0–10.5)
nRBC: 0 % (ref 0.0–0.2)

## 2020-09-16 LAB — MAGNESIUM: Magnesium: 1.6 mg/dL — ABNORMAL LOW (ref 1.7–2.4)

## 2020-09-16 MED ORDER — SODIUM CHLORIDE 0.9 % IV SOLN
Freq: Once | INTRAVENOUS | Status: AC
Start: 2020-09-16 — End: 2020-09-16
  Filled 2020-09-16: qty 250

## 2020-09-16 MED ORDER — DIPHENHYDRAMINE HCL 25 MG PO CAPS
50.0000 mg | ORAL_CAPSULE | Freq: Once | ORAL | Status: AC
  Administered 2020-09-16: 50 mg via ORAL

## 2020-09-16 MED ORDER — SODIUM CHLORIDE 0.9 % IV SOLN
10.0000 mg | Freq: Once | INTRAVENOUS | Status: AC
  Administered 2020-09-16: 10 mg via INTRAVENOUS
  Filled 2020-09-16: qty 10

## 2020-09-16 MED ORDER — PALONOSETRON HCL INJECTION 0.25 MG/5ML
0.2500 mg | Freq: Once | INTRAVENOUS | Status: AC
  Administered 2020-09-16: 0.25 mg via INTRAVENOUS

## 2020-09-16 MED ORDER — PALONOSETRON HCL INJECTION 0.25 MG/5ML
INTRAVENOUS | Status: AC
  Filled 2020-09-16: qty 5

## 2020-09-16 MED ORDER — SODIUM CHLORIDE 0.9% FLUSH
10.0000 mL | Freq: Once | INTRAVENOUS | Status: AC
  Administered 2020-09-16: 10 mL
  Filled 2020-09-16: qty 10

## 2020-09-16 MED ORDER — SODIUM CHLORIDE 0.9 % IV SOLN
500.0000 mg | Freq: Once | INTRAVENOUS | Status: AC
  Administered 2020-09-16: 500 mg via INTRAVENOUS
  Filled 2020-09-16: qty 50

## 2020-09-16 MED ORDER — ACETAMINOPHEN 325 MG PO TABS
ORAL_TABLET | ORAL | Status: AC
  Filled 2020-09-16: qty 2

## 2020-09-16 MED ORDER — SODIUM CHLORIDE 0.9 % IV SOLN
420.0000 mg | Freq: Once | INTRAVENOUS | Status: AC
  Administered 2020-09-16: 420 mg via INTRAVENOUS
  Filled 2020-09-16: qty 14

## 2020-09-16 MED ORDER — ACETAMINOPHEN 325 MG PO TABS
650.0000 mg | ORAL_TABLET | Freq: Once | ORAL | Status: AC
Start: 2020-09-16 — End: 2020-09-16
  Administered 2020-09-16: 650 mg via ORAL

## 2020-09-16 MED ORDER — DIPHENHYDRAMINE HCL 25 MG PO CAPS
ORAL_CAPSULE | ORAL | Status: AC
  Filled 2020-09-16: qty 2

## 2020-09-16 MED ORDER — SODIUM CHLORIDE 0.9 % IV SOLN
50.0000 mg/m2 | Freq: Once | INTRAVENOUS | Status: AC
  Administered 2020-09-16: 90 mg via INTRAVENOUS
  Filled 2020-09-16: qty 9

## 2020-09-16 MED ORDER — SODIUM CHLORIDE 0.9 % IV SOLN
150.0000 mg | Freq: Once | INTRAVENOUS | Status: AC
  Administered 2020-09-16: 150 mg via INTRAVENOUS
  Filled 2020-09-16: qty 150

## 2020-09-16 MED ORDER — TRASTUZUMAB-ANNS CHEMO 150 MG IV SOLR
6.0000 mg/kg | Freq: Once | INTRAVENOUS | Status: AC
  Administered 2020-09-16: 420 mg via INTRAVENOUS
  Filled 2020-09-16: qty 20

## 2020-09-18 ENCOUNTER — Ambulatory Visit: Payer: Managed Care, Other (non HMO)

## 2020-09-27 ENCOUNTER — Encounter: Payer: Self-pay | Admitting: Hematology and Oncology

## 2020-10-01 ENCOUNTER — Other Ambulatory Visit: Payer: Self-pay

## 2020-10-01 ENCOUNTER — Ambulatory Visit (HOSPITAL_COMMUNITY)
Admission: RE | Admit: 2020-10-01 | Discharge: 2020-10-01 | Disposition: A | Discharge status: Home / Self Care | Payer: Managed Care, Other (non HMO) | Source: Ambulatory Visit | Attending: Hematology and Oncology | Admitting: Hematology and Oncology

## 2020-10-01 ENCOUNTER — Other Ambulatory Visit (HOSPITAL_COMMUNITY): Payer: Managed Care, Other (non HMO)

## 2020-10-01 DIAGNOSIS — Z5181 Encounter for therapeutic drug level monitoring: Secondary | ICD-10-CM | POA: Diagnosis not present

## 2020-10-01 DIAGNOSIS — Z01818 Encounter for other preprocedural examination: Secondary | ICD-10-CM | POA: Insufficient documentation

## 2020-10-01 DIAGNOSIS — I361 Nonrheumatic tricuspid (valve) insufficiency: Secondary | ICD-10-CM | POA: Diagnosis not present

## 2020-10-01 DIAGNOSIS — C50919 Malignant neoplasm of unspecified site of unspecified female breast: Secondary | ICD-10-CM | POA: Diagnosis not present

## 2020-10-01 DIAGNOSIS — Z79899 Other long term (current) drug therapy: Secondary | ICD-10-CM | POA: Diagnosis not present

## 2020-10-06 NOTE — Progress Notes (Signed)
Patient Care Team: Patient, [name] (Inactive) as PCP - General (General Practice) [name], RN as Oncology Nurse Navigator [name], RN as Oncology Nurse Navigator  DIAGNOSIS:    ICD-10-CM   1. 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 palpable right breast mass measuring about 7 cm x 5 and half centimeters, palpable right axillary lymph node with concern for skin invasion    06/08/2020 Cancer Staging   Staging form: Breast, AJCC 8th Edition - Clinical: Stage IIIB (cT4d, cN1, cM0, G3, ER+, PR-, HER2+) - Signed by [name], MD on 07/07/2020  Histologic grading system: 3 grade system    06/24/2020 Genetic Testing   No pathogenic variants detected in [facility].  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  The report date is June 24, 2020.   The [facility]Next-Cancer+RNAinsight panel offered by [facility] includes sequencing and rearrangement analysis for the following 47 genes:  APC, ATM, AXIN2, BARD1, BMPR1A, BRCA1, BRCA2, BRIP1, CDH1, CDK4, CDKN2A, CHEK2, DICER1, EPCAM, GREM1, HOXB13, MEN1, MLH1, MSH2, MSH3, MSH6, MUTYH, NBN, NF1, NF2, NTHL1, PALB2, PMS2, POLD1, POLE, PTEN, RAD51C, RAD51D, RECQL, RET, SDHA, SDHAF2, SDHB, SDHC, SDHD, SMAD4, SMARCA4, STK11, TP53, TSC1, TSC2, and VHL.  RNA data is routinely analyzed for use in variant interpretation for all genes.   06/25/2020 -  Chemotherapy     Patient is on Treatment Plan: BREAST  DOCETAXEL + CARBOPLATIN + TRASTUZUMAB + PERTUZUMAB  (TCHP) Q21D          CHIEF COMPLIANT: Cycle 6 Day 1 TCHP  INTERVAL HISTORY: [name] is a 45 y.o. with above-mentioned history of HER-2 positive right breast cancer currently on neoadjuvant chemotherapy with TCH Perjeta, but Perjeta and Taxotere have been discontinued due to gastrointestinal side effects. She presents to the clinic today for a toxicity check and treatment.  She has mild intermittent diarrhea but she is able to manage it without any problems.  She did not have any blood in the stool.  ALLERGIES:  is allergic to other.  MEDICATIONS:  Current Outpatient Medications  Medication Sig Dispense Refill   dexamethasone (DECADRON) 4 MG tablet Take 2 tablets (8 mg total) by mouth 2 (two) times daily. Start the day before Taxotere. Then take daily x 3 days after chemotherapy. 30 tablet 1   diphenoxylate-atropine (LOMOTIL) 2.5-0.025 MG tablet 1 to 2 tablets before meals and also use at bedtime if needed 90 tablet 1   ferrous sulfate 325 (65 FE) MG tablet Take 325 mg by mouth daily with breakfast.     lidocaine-prilocaine (EMLA) cream Apply 1 application topically as needed (Prior to port access). 30 g 2   loratadine (CLARITIN) 10 MG tablet Take 10 mg by mouth See admin instructions. Take 7 days starting the day of injection     LORazepam (ATIVAN) 0.5 MG tablet Take 1 tablet (0.5 mg total) by mouth every 6 (six) hours as needed (  Nausea or vomiting). 30 tablet 0   mesalamine (LIALDA) 1.2 g EC tablet Take 2 tablets (2.4 g total) by mouth 2 (two) times daily with a meal. 120 tablet 11   Multiple Vitamin (MULTIVITAMIN) tablet Take 1 tablet by mouth daily.     ondansetron (ZOFRAN) 8 MG tablet Take 1 tablet (8 mg total) by mouth 2 (two) times daily as needed (Nausea or vomiting). Start on the third day after chemotherapy. 30 tablet 1   prochlorperazine (COMPAZINE) 10 MG tablet Take 1 tablet (10 mg  total) by mouth every 6 (six) hours as needed (Nausea or vomiting). 30 tablet 1   traMADol (ULTRAM) 50 MG tablet Take 1 tablet (50 mg total) by mouth every 6 (six) hours as needed for severe pain. 30 tablet 0   No current facility-administered medications for this visit.    PHYSICAL EXAMINATION: ECOG PERFORMANCE STATUS: 1 - Symptomatic but completely ambulatory  Vitals:   10/07/20 1046  BP: (!) 148/90  Pulse: 85  Resp: 18  Temp: 97.7 F (36.5 C)  SpO2: 100%   Filed Weights   10/07/20 1046  Weight: 148 lb 1.6 oz (67.2 kg)    LABORATORY DATA:  I have reviewed the data as listed CMP Latest Ref Rng & Units 09/16/2020 08/26/2020 08/05/2020  Glucose 70 - 99 mg/dL 185(H) 153(H) 128(H)  BUN 6 - 20 mg/dL _0 Creatinine 0.44 - 1.00 mg/dL 0.79 0.78 0.77  Sodium 135 - 145 mmol/L 141 138 140  Potassium 3.5 - 5.1 mmol/L 3.6 3.5 3.8  Chloride 98 - 111 mmol/L 103 101 105  CO2 22 - 32 mmol/L _1 Calcium 8.9 - 10.3 mg/dL 10.0 9.6 9.3  Total Protein 6.5 - 8.1 g/dL 7.8 7.8 7.9  Total Bilirubin 0.3 - 1.2 mg/dL 0.3 0.4 0.4  Alkaline Phos 38 - 126 U/L 101 111 115  AST 15 - 41 U/L 14(L) 15 15  ALT 0 - 44 U/L _2 Lab Results  Component Value Date   WBC 10.8 (H) 10/07/2020   HGB 9.2 (L) 10/07/2020   HCT 28.1 (L) 10/07/2020   MCV 85.9 10/07/2020   PLT 186 10/07/2020   NEUTROABS 9.6 (H) 10/07/2020    ASSESSMENT & PLAN:  Malignant neoplasm of upper-outer quadrant of right breast in female, estrogen receptor positive ([place]) 06/03/2020, US guided biopsy of the right breast 9 0 clock mass showed grade III IDC; Prognostics showed ER 40% positive, weak staining, PR positive 0 %, Her 2 3 +, Ki 67 30% 4dCN1M0 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 palpable right breast mass measuring about 7 cm x 5 and half centimeters, palpable right axillary lymph node with concern for skin invasion    Treatment Plan: 1. Neoadjuvant chemo with TCHP q 3 weeks X 6 followed by HP vs  Kadcyla 2. Mastectomy with TAD 3. Adj XRT 4. Adj Anti-estrogen therapy 5. Neratinib --------------------------------------------------------------------------------------------------------- Current Treatment : neoadj TCHP started 06/25/20, Today is cycle 5 (taxotere and Perjeta were held for cycle 2), Taxotere at a reduced dose added for cycle 3   Chemo Toxicities: 1. Bloody Diarrhea: No blood in the stool. 2. Hospitalization 3/23-3/25/22: History of Sub total colectomy, Crohns disease, Iron deficiency Anemia   Iron Deficiency Anemia: Today's hemoglobin is 9.2 and it is stable.  Breast MRI scheduled for 10/08/2020.  She will come back in 3 weeks for Herceptin Perjeta maintenance I will schedule her for a treatment of Herceptin and Perjeta  in 3 weeks.  She would like to do her treatments on Fridays.  I discussed with her about the need for radiation and the need for antiestrogen therapy and anti-HER2 maintenance therapy. Okay to treat her without the result of the echocardiogram from today.  No orders of the defined types were placed in this encounter.  The patient has a good understanding of the overall plan. she agrees with it. she will call with any problems that may develop before the next visit here.  Total time spent: 30 mins including face to face time and time spent for planning, charting and coordination of care  Rulon Eisenmenger, MD, MPH 10/07/2020  I, Reinaldo Raddle, am acting as scribe for Dr. [name], MD.

## 2020-10-07 ENCOUNTER — Encounter: Payer: Self-pay | Admitting: *Deleted

## 2020-10-07 ENCOUNTER — Other Ambulatory Visit: Payer: Self-pay

## 2020-10-07 ENCOUNTER — Inpatient Hospital Stay (HOSPITAL_BASED_OUTPATIENT_CLINIC_OR_DEPARTMENT_OTHER): Payer: Managed Care, Other (non HMO) | Admitting: Hematology and Oncology

## 2020-10-07 ENCOUNTER — Inpatient Hospital Stay: Payer: Managed Care, Other (non HMO)

## 2020-10-07 VITALS — BP 123/81 | HR 98 | Temp 97.8°F | Resp 20

## 2020-10-07 DIAGNOSIS — Z170 Estrogen receptor positive status [ER+]: Secondary | ICD-10-CM

## 2020-10-07 DIAGNOSIS — C50411 Malignant neoplasm of upper-outer quadrant of right female breast: Secondary | ICD-10-CM

## 2020-10-07 DIAGNOSIS — Z5112 Encounter for antineoplastic immunotherapy: Secondary | ICD-10-CM | POA: Diagnosis not present

## 2020-10-07 DIAGNOSIS — Z95828 Presence of other vascular implants and grafts: Secondary | ICD-10-CM

## 2020-10-07 LAB — CMP (CANCER CENTER ONLY)
ALT: 14 U/L (ref 0–44)
AST: 15 U/L (ref 15–41)
Albumin: 3.5 g/dL (ref 3.5–5.0)
Alkaline Phosphatase: 109 U/L (ref 38–126)
Anion gap: 12 (ref 5–15)
BUN: 13 mg/dL (ref 6–20)
CO2: 25 mmol/L (ref 22–32)
Calcium: 9.9 mg/dL (ref 8.9–10.3)
Chloride: 104 mmol/L (ref 98–111)
Creatinine: 0.73 mg/dL (ref 0.44–1.00)
GFR, Estimated: >60 mL/min (ref >60)
Glucose, Bld: 107 mg/dL — ABNORMAL HIGH (ref 70–99)
Potassium: 3.8 mmol/L (ref 3.5–5.1)
Sodium: 141 mmol/L (ref 135–145)
Total Bilirubin: 0.4 mg/dL (ref 0.3–1.2)
Total Protein: 7.7 g/dL (ref 6.5–8.1)

## 2020-10-07 LAB — CBC WITH DIFFERENTIAL (CANCER CENTER ONLY)
Abs Immature Granulocytes: 0.03 10*3/uL (ref 0.00–0.07)
Basophils Absolute: 0 10*3/uL (ref 0.0–0.1)
Basophils Relative: 0 %
Eosinophils Absolute: 0 10*3/uL (ref 0.0–0.5)
Eosinophils Relative: 0 %
HCT: 28.1 % — ABNORMAL LOW (ref 36.0–46.0)
Hemoglobin: 9.2 g/dL — ABNORMAL LOW (ref 12.0–15.0)
Immature Granulocytes: 0 %
Lymphocytes Relative: 8 %
Lymphs Abs: 0.9 10*3/uL (ref 0.7–4.0)
MCH: 28.1 pg (ref 26.0–34.0)
MCHC: 32.7 g/dL (ref 30.0–36.0)
MCV: 85.9 fL (ref 80.0–100.0)
Monocytes Absolute: 0.4 10*3/uL (ref 0.1–1.0)
Monocytes Relative: 3 %
Neutro Abs: 9.6 10*3/uL — ABNORMAL HIGH (ref 1.7–7.7)
Neutrophils Relative %: 89 %
Platelet Count: 186 10*3/uL (ref 150–400)
RBC: 3.27 MIL/uL — ABNORMAL LOW (ref 3.87–5.11)
RDW: 18.1 % — ABNORMAL HIGH (ref 11.5–15.5)
WBC Count: 10.8 10*3/uL — ABNORMAL HIGH (ref 4.0–10.5)
nRBC: 0 % (ref 0.0–0.2)

## 2020-10-07 LAB — ECHOCARDIOGRAM COMPLETE: S' Lateral: 2.6 cm

## 2020-10-07 LAB — MAGNESIUM: Magnesium: 1.8 mg/dL (ref 1.7–2.4)

## 2020-10-07 MED ORDER — DOCETAXEL CHEMO INJECTION 160 MG/16ML
50.0000 mg/m2 | Freq: Once | INTRAVENOUS | Status: AC
  Administered 2020-10-07: 90 mg via INTRAVENOUS
  Filled 2020-10-07: qty 9

## 2020-10-07 MED ORDER — SODIUM CHLORIDE 0.9% FLUSH
10.0000 mL | Freq: Once | INTRAVENOUS | Status: AC
Start: 2020-10-07 — End: 2020-10-07
  Administered 2020-10-07: 10 mL
  Filled 2020-10-07: qty 10

## 2020-10-07 MED ORDER — SODIUM CHLORIDE 0.9 % IV SOLN
150.0000 mg | Freq: Once | INTRAVENOUS | Status: AC
  Administered 2020-10-07: 150 mg via INTRAVENOUS
  Filled 2020-10-07: qty 150

## 2020-10-07 MED ORDER — SODIUM CHLORIDE 0.9 % IV SOLN
420.0000 mg | Freq: Once | INTRAVENOUS | Status: AC
  Administered 2020-10-07: 420 mg via INTRAVENOUS
  Filled 2020-10-07: qty 14

## 2020-10-07 MED ORDER — DIPHENHYDRAMINE HCL 25 MG PO CAPS
50.0000 mg | ORAL_CAPSULE | Freq: Once | ORAL | Status: AC
  Administered 2020-10-07: 50 mg via ORAL

## 2020-10-07 MED ORDER — SODIUM CHLORIDE 0.9 % IV SOLN
Freq: Once | INTRAVENOUS | Status: AC
Start: 2020-10-07 — End: 2020-10-07
  Filled 2020-10-07: qty 250

## 2020-10-07 MED ORDER — PALONOSETRON HCL INJECTION 0.25 MG/5ML
0.2500 mg | Freq: Once | INTRAVENOUS | Status: AC
  Administered 2020-10-07: 0.25 mg via INTRAVENOUS

## 2020-10-07 MED ORDER — ACETAMINOPHEN 325 MG PO TABS
ORAL_TABLET | ORAL | Status: AC
  Filled 2020-10-07: qty 2

## 2020-10-07 MED ORDER — PALONOSETRON HCL INJECTION 0.25 MG/5ML
INTRAVENOUS | Status: AC
  Filled 2020-10-07: qty 5

## 2020-10-07 MED ORDER — ACETAMINOPHEN 325 MG PO TABS
650.0000 mg | ORAL_TABLET | Freq: Once | ORAL | Status: AC
  Administered 2020-10-07: 650 mg via ORAL

## 2020-10-07 MED ORDER — SODIUM CHLORIDE 0.9 % IV SOLN
500.0000 mg | Freq: Once | INTRAVENOUS | Status: AC
  Administered 2020-10-07: 500 mg via INTRAVENOUS
  Filled 2020-10-07: qty 50

## 2020-10-07 MED ORDER — COLD PACK MISC ONCOLOGY
1.0000 | Freq: Once | Status: DC | PRN
  Filled 2020-10-07: qty 1

## 2020-10-07 MED ORDER — SODIUM CHLORIDE 0.9% FLUSH
10.0000 mL | INTRAVENOUS | Status: DC | PRN
  Administered 2020-10-07: 10 mL
  Filled 2020-10-07: qty 10

## 2020-10-07 MED ORDER — TRASTUZUMAB-ANNS CHEMO 150 MG IV SOLR
6.0000 mg/kg | Freq: Once | INTRAVENOUS | Status: AC
  Administered 2020-10-07: 420 mg via INTRAVENOUS
  Filled 2020-10-07: qty 20

## 2020-10-07 MED ORDER — HEPARIN SOD (PORK) LOCK FLUSH 100 UNIT/ML IV SOLN
500.0000 [IU] | Freq: Once | INTRAVENOUS | Status: AC | PRN
  Administered 2020-10-07: 500 [IU]
  Filled 2020-10-07: qty 5

## 2020-10-07 MED ORDER — SODIUM CHLORIDE 0.9 % IV SOLN
10.0000 mg | Freq: Once | INTRAVENOUS | Status: AC
  Administered 2020-10-07: 10 mg via INTRAVENOUS
  Filled 2020-10-07: qty 10

## 2020-10-07 MED ORDER — DIPHENHYDRAMINE HCL 25 MG PO CAPS
ORAL_CAPSULE | ORAL | Status: AC
  Filled 2020-10-07: qty 2

## 2020-10-07 NOTE — Assessment & Plan Note (Signed)
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 palpable right breast mass measuring about 7 cm x 5 and half centimeters, palpable right axillary lymph node with concern for skin invasion  Treatment Plan: 1. Neoadjuvant chemo with TCHP q 3 weeks X 6 followed by HP vs Kadcyla 2. Mastectomy with TAD 3. Adj XRT 4. Adj Anti-estrogen therapy 5. Neratinib --------------------------------------------------------------------------------------------------------- Current Treatment: neoadj TCHP started 06/25/20, Today is cycle5(taxotere and Perjetawereheld for cycle 2), Taxotere at a reduced dose added for cycle 3  Chemo Toxicities: 1. Bloody Diarrhea:Much improved, could be related to hemorrhoids 2. Hospitalization 3/23-3/25/22:History ofSub total colectomy, Crohns disease, Iron deficiency Anemia 3. Bone Pain: Due to Neulasta which is being discontinued  Iron Deficiency Anemia:Today's hemoglobin is 9.4 and it is stable.  Breast MRI scheduled for 10/08/2020.  She will come back in 3 weeks for Herceptin Perjeta maintenance  I discussed with her about the need for radiation and the need for antiestrogen therapy and anti-HER2 maintenance therapy.

## 2020-10-07 NOTE — Patient Instructions (Signed)
[facility]  Discharge Instructions: Thank you for choosing [facility] to provide your oncology and hematology care.   If you have a lab appointment with the [facility], please go directly to the [facility] and check in at the registration area.   Wear comfortable clothing and clothing appropriate for easy access to any Portacath or PICC line.   We strive to give you quality time with your provider. You may need to reschedule your appointment if you arrive late (15 or more minutes).  Arriving late affects you and other patients whose appointments are after yours.  Also, if you miss three or more appointments without notifying the office, you may be dismissed from the clinic at the provider's discretion.      For prescription refill requests, have your pharmacy contact our office and allow 72 hours for refills to be completed.    Today you received the following chemotherapy and/or immunotherapy agents: Trastuzumab, Pertuzumab (Perjeta), Docetaxel (Taxotere), and Carboplatin.   To help prevent nausea and vomiting after your treatment, we encourage you to take your nausea medication as directed.  BELOW ARE SYMPTOMS THAT SHOULD BE REPORTED IMMEDIATELY: *FEVER GREATER THAN 100.4 F (38 C) OR HIGHER *CHILLS OR SWEATING *NAUSEA AND VOMITING THAT IS NOT CONTROLLED WITH YOUR NAUSEA MEDICATION *UNUSUAL SHORTNESS OF BREATH *UNUSUAL BRUISING OR BLEEDING *URINARY PROBLEMS (pain or burning when urinating, or frequent urination) *BOWEL PROBLEMS (unusual diarrhea, constipation, pain near the anus) TENDERNESS IN MOUTH AND THROAT WITH OR WITHOUT PRESENCE OF ULCERS (sore throat, sores in mouth, or a toothache) UNUSUAL RASH, SWELLING OR PAIN  UNUSUAL VAGINAL DISCHARGE OR ITCHING   Items with * indicate a potential emergency and should be followed up as soon as possible or go to the Emergency Department if any problems should occur.  Please show the  CHEMOTHERAPY ALERT CARD or IMMUNOTHERAPY ALERT CARD at check-in to the Emergency Department and triage nurse.  Should you have questions after your visit or need to cancel or reschedule your appointment, please contact [facility]  Dept: [phone]  and follow the prompts.  Office hours are 8:00 a.m. to 4:30 p.m. Monday - Friday. Please note that voicemails left after 4:00 p.m. may not be returned until the following business day.  We are closed weekends and major holidays. You have access to a nurse at all times for urgent questions. Please call the main number to the clinic Dept: [phone] and follow the prompts.   For any non-urgent questions, you may also contact your provider using MyChart. We now offer e-Visits for anyone 26 and older to request care online for non-urgent symptoms. For details visit [email or web address].   Also download the MyChart app! Go to the app store, search "MyChart", open the app, select [facility], and log in with your MyChart username and password.  Due to Covid, a mask is required upon entering the hospital/clinic. If you do not have a mask, one will be given to you upon arrival. For doctor visits, patients may have 1 support person aged 43 or older with them. For treatment visits, patients cannot have anyone with them due to current Covid guidelines and our immunocompromised population.

## 2020-10-07 NOTE — Progress Notes (Signed)
Per Dr. Lindi Adie ok for treatment today with ECHO 3/22

## 2020-10-08 ENCOUNTER — Telehealth: Payer: Self-pay | Admitting: Hematology and Oncology

## 2020-10-08 ENCOUNTER — Ambulatory Visit
Admission: RE | Admit: 2020-10-08 | Discharge: 2020-10-08 | Disposition: A | Discharge status: Home / Self Care | Payer: Managed Care, Other (non HMO) | Source: Ambulatory Visit | Attending: Hematology and Oncology | Admitting: Hematology and Oncology

## 2020-10-08 DIAGNOSIS — C50411 Malignant neoplasm of upper-outer quadrant of right female breast: Secondary | ICD-10-CM

## 2020-10-08 MED ORDER — GADOBUTROL 1 MMOL/ML IV SOLN
7.0000 mL | Freq: Once | INTRAVENOUS | Status: AC | PRN
  Administered 2020-10-08: 7 mL via INTRAVENOUS

## 2020-10-08 NOTE — Telephone Encounter (Signed)
R/s appt time. Pt aware. Per 7/1 sch msg

## 2020-10-08 NOTE — Telephone Encounter (Signed)
Scheduled per 6/30 los. Called pt and left a msg

## 2020-10-12 ENCOUNTER — Encounter: Payer: Self-pay | Admitting: *Deleted

## 2020-10-14 ENCOUNTER — Encounter: Payer: Self-pay | Admitting: Hematology and Oncology

## 2020-10-14 ENCOUNTER — Encounter: Payer: Self-pay | Admitting: *Deleted

## 2020-10-15 ENCOUNTER — Telehealth: Payer: Self-pay | Admitting: Hematology and Oncology

## 2020-10-15 ENCOUNTER — Ambulatory Visit: Payer: Managed Care, Other (non HMO) | Admitting: Internal Medicine

## 2020-10-15 NOTE — Telephone Encounter (Signed)
Scheduled appts per 7/7 sch msg. Called pt, no answer. Left msg with appts dates and times.

## 2020-10-15 NOTE — Telephone Encounter (Signed)
Per RN Tammi, pt had called in to discuss appts. I called pt back and made updates to her schedule per her request. Pt is aware of appts.

## 2020-10-21 ENCOUNTER — Other Ambulatory Visit: Payer: Self-pay

## 2020-10-21 ENCOUNTER — Ambulatory Visit (INDEPENDENT_AMBULATORY_CARE_PROVIDER_SITE_OTHER): Payer: Managed Care, Other (non HMO)

## 2020-10-21 VITALS — BP 119/78 | HR 102 | Ht 64.0 in | Wt 146.0 lb

## 2020-10-21 DIAGNOSIS — Z170 Estrogen receptor positive status [ER+]: Secondary | ICD-10-CM

## 2020-10-21 DIAGNOSIS — C50411 Malignant neoplasm of upper-outer quadrant of right female breast: Secondary | ICD-10-CM

## 2020-10-26 ENCOUNTER — Ambulatory Visit: Payer: Managed Care, Other (non HMO) | Admitting: Internal Medicine

## 2020-10-28 ENCOUNTER — Other Ambulatory Visit: Payer: Self-pay | Admitting: Pharmacist

## 2020-10-28 ENCOUNTER — Other Ambulatory Visit: Payer: Self-pay | Admitting: Hematology and Oncology

## 2020-10-29 ENCOUNTER — Ambulatory Visit: Payer: Managed Care, Other (non HMO)

## 2020-10-29 ENCOUNTER — Other Ambulatory Visit: Payer: Self-pay

## 2020-10-29 ENCOUNTER — Inpatient Hospital Stay: Payer: Managed Care, Other (non HMO) | Attending: Hematology and Oncology

## 2020-10-29 VITALS — BP 119/83 | HR 109 | Temp 98.2°F | Resp 20 | Ht 64.0 in | Wt 149.5 lb

## 2020-10-29 DIAGNOSIS — C50411 Malignant neoplasm of upper-outer quadrant of right female breast: Secondary | ICD-10-CM | POA: Insufficient documentation

## 2020-10-29 DIAGNOSIS — Z5112 Encounter for antineoplastic immunotherapy: Secondary | ICD-10-CM | POA: Diagnosis present

## 2020-10-29 DIAGNOSIS — D6481 Anemia due to antineoplastic chemotherapy: Secondary | ICD-10-CM | POA: Insufficient documentation

## 2020-10-29 DIAGNOSIS — Z170 Estrogen receptor positive status [ER+]: Secondary | ICD-10-CM

## 2020-10-29 MED ORDER — ACETAMINOPHEN 325 MG PO TABS
650.0000 mg | ORAL_TABLET | Freq: Once | ORAL | Status: AC
  Administered 2020-10-29: 650 mg via ORAL

## 2020-10-29 MED ORDER — TRASTUZUMAB-ANNS CHEMO 150 MG IV SOLR
6.0000 mg/kg | Freq: Once | INTRAVENOUS | Status: AC
  Administered 2020-10-29: 420 mg via INTRAVENOUS
  Filled 2020-10-29: qty 20

## 2020-10-29 MED ORDER — ACETAMINOPHEN 325 MG PO TABS
ORAL_TABLET | ORAL | Status: AC
  Filled 2020-10-29: qty 2

## 2020-10-29 MED ORDER — HEPARIN SOD (PORK) LOCK FLUSH 100 UNIT/ML IV SOLN
500.0000 [IU] | Freq: Once | INTRAVENOUS | Status: AC | PRN
  Administered 2020-10-29: 500 [IU]
  Filled 2020-10-29: qty 5

## 2020-10-29 MED ORDER — SODIUM CHLORIDE 0.9 % IV SOLN
420.0000 mg | Freq: Once | INTRAVENOUS | Status: AC
  Administered 2020-10-29: 420 mg via INTRAVENOUS
  Filled 2020-10-29: qty 14

## 2020-10-29 MED ORDER — DIPHENHYDRAMINE HCL 25 MG PO CAPS
ORAL_CAPSULE | ORAL | Status: AC
  Filled 2020-10-29: qty 2

## 2020-10-29 MED ORDER — SODIUM CHLORIDE 0.9% FLUSH
10.0000 mL | INTRAVENOUS | Status: DC | PRN
  Administered 2020-10-29: 10 mL
  Filled 2020-10-29: qty 10

## 2020-10-29 MED ORDER — SODIUM CHLORIDE 0.9 % IV SOLN
Freq: Once | INTRAVENOUS | Status: AC
  Filled 2020-10-29: qty 250

## 2020-10-29 MED ORDER — DIPHENHYDRAMINE HCL 25 MG PO CAPS
50.0000 mg | ORAL_CAPSULE | Freq: Once | ORAL | Status: AC
  Administered 2020-10-29: 50 mg via ORAL

## 2020-10-29 NOTE — Progress Notes (Signed)
Per Dr. Lindi Adie - okay to treat with elevated heart rate.  Also, patient does not require any updated labs today.

## 2020-10-29 NOTE — Patient Instructions (Signed)
[facility]   Discharge Instructions: Thank you for choosing [facility] to provide your oncology and hematology care.   If you have a lab appointment with the [facility], please go directly to the [facility] and check in at the registration area.   Wear comfortable clothing and clothing appropriate for easy access to any Portacath or PICC line.   We strive to give you quality time with your provider. You may need to reschedule your appointment if you arrive late (15 or more minutes).  Arriving late affects you and other patients whose appointments are after yours.  Also, if you miss three or more appointments without notifying the office, you may be dismissed from the clinic at the provider's discretion.      For prescription refill requests, have your pharmacy contact our office and allow 72 hours for refills to be completed.    Today you received the following chemotherapy and/or immunotherapy agents: Trastuzumab (Herceptin) and Pertuzumab (Perjeta)      To help prevent nausea and vomiting after your treatment, we encourage you to take your nausea medication as directed.  BELOW ARE SYMPTOMS THAT SHOULD BE REPORTED IMMEDIATELY: *FEVER GREATER THAN 100.4 F (38 C) OR HIGHER *CHILLS OR SWEATING *NAUSEA AND VOMITING THAT IS NOT CONTROLLED WITH YOUR NAUSEA MEDICATION *UNUSUAL SHORTNESS OF BREATH *UNUSUAL BRUISING OR BLEEDING *URINARY PROBLEMS (pain or burning when urinating, or frequent urination) *BOWEL PROBLEMS (unusual diarrhea, constipation, pain near the anus) TENDERNESS IN MOUTH AND THROAT WITH OR WITHOUT PRESENCE OF ULCERS (sore throat, sores in mouth, or a toothache) UNUSUAL RASH, SWELLING OR PAIN  UNUSUAL VAGINAL DISCHARGE OR ITCHING   Items with * indicate a potential emergency and should be followed up as soon as possible or go to the Emergency Department if any problems should occur.  Please show the CHEMOTHERAPY ALERT CARD or  IMMUNOTHERAPY ALERT CARD at check-in to the Emergency Department and triage nurse.  Should you have questions after your visit or need to cancel or reschedule your appointment, please contact [facility]  Dept: [phone]  and follow the prompts.  Office hours are 8:00 a.m. to 4:30 p.m. Monday - Friday. Please note that voicemails left after 4:00 p.m. may not be returned until the following business day.  We are closed weekends and major holidays. You have access to a nurse at all times for urgent questions. Please call the main number to the clinic Dept: [phone] and follow the prompts.   For any non-urgent questions, you may also contact your provider using MyChart. We now offer e-Visits for anyone 88 and older to request care online for non-urgent symptoms. For details visit [email or web address].   Also download the MyChart app! Go to the app store, search "MyChart", open the app, select [facility], and log in with your MyChart username and password.  Due to Covid, a mask is required upon entering the hospital/clinic. If you do not have a mask, one will be given to you upon arrival. For doctor visits, patients may have 1 support person aged 88 or older with them. For treatment visits, patients cannot have anyone with them due to current Covid guidelines and our immunocompromised population.

## 2020-11-02 ENCOUNTER — Other Ambulatory Visit: Payer: Self-pay

## 2020-11-02 ENCOUNTER — Telehealth: Payer: Self-pay | Admitting: *Deleted

## 2020-11-02 ENCOUNTER — Other Ambulatory Visit: Payer: Self-pay | Admitting: *Deleted

## 2020-11-02 ENCOUNTER — Encounter (HOSPITAL_BASED_OUTPATIENT_CLINIC_OR_DEPARTMENT_OTHER): Payer: Self-pay | Admitting: General Surgery

## 2020-11-02 DIAGNOSIS — D500 Iron deficiency anemia secondary to blood loss (chronic): Secondary | ICD-10-CM

## 2020-11-02 DIAGNOSIS — C50411 Malignant neoplasm of upper-outer quadrant of right female breast: Secondary | ICD-10-CM

## 2020-11-02 NOTE — Telephone Encounter (Signed)
Received call back from patient and she will come in tomorrow 7/27 for lab work at 7:45am.

## 2020-11-02 NOTE — Progress Notes (Addendum)
Reviewed upcoming surgical procedure, pt's pmh of chronic anemia and recent labwork with Dr Ambrose Pancoast.  Pt's surgery will need to be moved to the Main OR. LVM with Ivey at [facility] to move the case.    [name] and Dr Ambrose Pancoast discussed the case, and have decided that it can be done at [facility].

## 2020-11-02 NOTE — Telephone Encounter (Signed)
Left message for a return phone call to schedule lab appt to recheck iron and hgb studies prior to sx.

## 2020-11-03 ENCOUNTER — Encounter: Payer: Self-pay | Admitting: Hematology and Oncology

## 2020-11-03 ENCOUNTER — Inpatient Hospital Stay: Payer: Managed Care, Other (non HMO)

## 2020-11-03 DIAGNOSIS — Z5112 Encounter for antineoplastic immunotherapy: Secondary | ICD-10-CM | POA: Diagnosis not present

## 2020-11-03 DIAGNOSIS — C50411 Malignant neoplasm of upper-outer quadrant of right female breast: Secondary | ICD-10-CM

## 2020-11-03 DIAGNOSIS — Z170 Estrogen receptor positive status [ER+]: Secondary | ICD-10-CM

## 2020-11-03 DIAGNOSIS — D500 Iron deficiency anemia secondary to blood loss (chronic): Secondary | ICD-10-CM

## 2020-11-03 LAB — IRON AND TIBC
Iron: 91 ug/dL (ref 41–142)
Saturation Ratios: 30 % (ref 21–57)
TIBC: 301 ug/dL (ref 236–444)
UIBC: 210 ug/dL (ref 120–384)

## 2020-11-03 LAB — CBC WITH DIFFERENTIAL (CANCER CENTER ONLY)
Abs Immature Granulocytes: 0.01 10*3/uL (ref 0.00–0.07)
Basophils Absolute: 0 10*3/uL (ref 0.0–0.1)
Basophils Relative: 0 %
Eosinophils Absolute: 0 10*3/uL (ref 0.0–0.5)
Eosinophils Relative: 0 %
HCT: 30.1 % — ABNORMAL LOW (ref 36.0–46.0)
Hemoglobin: 9.5 g/dL — ABNORMAL LOW (ref 12.0–15.0)
Immature Granulocytes: 0 %
Lymphocytes Relative: 22 %
Lymphs Abs: 1.2 10*3/uL (ref 0.7–4.0)
MCH: 27.9 pg (ref 26.0–34.0)
MCHC: 31.6 g/dL (ref 30.0–36.0)
MCV: 88.3 fL (ref 80.0–100.0)
Monocytes Absolute: 0.5 10*3/uL (ref 0.1–1.0)
Monocytes Relative: 10 %
Neutro Abs: 3.5 10*3/uL (ref 1.7–7.7)
Neutrophils Relative %: 68 %
Platelet Count: 177 10*3/uL (ref 150–400)
RBC: 3.41 MIL/uL — ABNORMAL LOW (ref 3.87–5.11)
RDW: 17.9 % — ABNORMAL HIGH (ref 11.5–15.5)
WBC Count: 5.2 10*3/uL (ref 4.0–10.5)
nRBC: 0 % (ref 0.0–0.2)

## 2020-11-03 LAB — RETICULOCYTES
Immature Retic Fract: 22.8 % — ABNORMAL HIGH (ref 2.3–15.9)
RBC.: 3.38 MIL/uL — ABNORMAL LOW (ref 3.87–5.11)
Retic Count, Absolute: 97 10*3/uL (ref 19.0–186.0)
Retic Ct Pct: 2.9 % (ref 0.4–3.1)

## 2020-11-03 LAB — FERRITIN: Ferritin: 64 ng/mL (ref 11–307)

## 2020-11-07 NOTE — Patient Instructions (Signed)
Patient will call for any changes or concerns

## 2020-11-07 NOTE — Progress Notes (Signed)
10/21/20     Patient ID: [name], female    DOB: [birth year], 45 y.o.   MRN: [id number]  Chief Complaint  Patient presents with   Pre-op Exam    No diagnosis found. Right breast cancer   History of Present Illness: [name] is a 45 y.o.  female  with a history of right breast cancer.  She presents for preoperative evaluation for upcoming procedure, bilateral breast reconstruction with tissue expanders & A-cellular dermal matrix, scheduled for 11/09/20 with Dr. Claudia Desanctis.  The patient has not had problems with anesthesia.   Summary of Previous Visit: consult with Dr. Claudia Desanctis for breast reconstruction options.  Job: [employer]  PMH Significant for:   Right breast cancer Possible immunotherapy for breast cancer scheduled to begin 10/30/20 May require radiation in future Chron's disease- colon resection 2017 Vitamin D deficiency Former smoker- quit 2 years ago Patient refuses "all blood products"      Past Medical History: Allergies: Allergies  Allergen Reactions   Other     BLOOD PRODUCT REFUSAL    Current Medications:  Current Outpatient Medications:    ferrous sulfate 325 (65 FE) MG tablet, Take 325 mg by mouth daily with breakfast., Disp: , Rfl:    loratadine (CLARITIN) 10 MG tablet, Take 10 mg by mouth See admin instructions. Take 7 days starting the day of injection, Disp: , Rfl:    mesalamine (LIALDA) 1.2 g EC tablet, Take 2 tablets (2.4 g total) by mouth 2 (two) times daily with a meal., Disp: 120 tablet, Rfl: 11   Multiple Vitamin (MULTIVITAMIN) tablet, Take 1 tablet by mouth daily., Disp: , Rfl:    dexamethasone (DECADRON) 4 MG tablet, Take 2 tablets (8 mg total) by mouth 2 (two) times daily. Start the day before Taxotere. Then take daily x 3 days after chemotherapy., Disp: 30 tablet, Rfl: 1   diphenoxylate-atropine (LOMOTIL) 2.5-0.025 MG tablet, 1 to 2 tablets before meals and also use at bedtime if needed, Disp: 90 tablet, Rfl: 1   lidocaine-prilocaine  (EMLA) cream, Apply 1 application topically as needed (Prior to port access)., Disp: 30 g, Rfl: 2   LORazepam (ATIVAN) 0.5 MG tablet, Take 1 tablet (0.5 mg total) by mouth every 6 (six) hours as needed (Nausea or vomiting)., Disp: 30 tablet, Rfl: 0   ondansetron (ZOFRAN) 8 MG tablet, Take 1 tablet (8 mg total) by mouth 2 (two) times daily as needed (Nausea or vomiting). 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 10/17/2010   Rectal bleeding    Uterine fibroid    Vitamin D deficiency 09/24/2013    Past Surgical History: Past Surgical History:  Procedure Laterality Date   BIOPSY  07/01/2020   Procedure: BIOPSY;  Surgeon: Yetta Flock, MD;  Location: [facility];  Service: Gastroenterology;;   COLECTOMY  06/27/2019   COLONOSCOPY  2015   FLEXIBLE SIGMOIDOSCOPY N/A  07/01/2020   Procedure: FLEXIBLE SIGMOIDOSCOPY;  Surgeon: Yetta Flock, MD;  Location: [facility];  Service: Gastroenterology;  Laterality: N/A;   PORTACATH PLACEMENT N/A 06/23/2020   Procedure: INSERTION PORT-A-CATH;  Surgeon: [name], MD;  Location: [facility];  Service: General;  Laterality: N/A;  START TIME OF 11:00 AM FOR 60 MINUTES [place] [facility]    Social History:   daughter [name] ph# [phone] Social History   Socioeconomic History   Marital status: Significant Other    Spouse name: Not on file   Number of  children: 1   Years of education: 13   Highest education level: Not on file  Occupational History   Occupation: Medical Coder   Tobacco Use   Smoking status: Former    Types: Cigarettes   Smokeless tobacco: Never  Vaping Use   Vaping Use: Never used  Substance and Sexual Activity   Alcohol use: Yes    Alcohol/week: 1.0 - 2.0 standard drink    Types: 1 - 2 Glasses of wine per week   Drug use: No   Sexual activity: Yes    Partners: Male    Comment: Intermittent protection not consistent  Other Topics Concern   Not on file  Social History Narrative   Single, 1 daughter born 2001 approximately   Medical coder for [employer]   She is a former smoker, occasional alcohol no drug use   Social Determinants of Radio broadcast assistant Strain: Not on file  Food Insecurity: Not on file  Transportation Needs: Not on file  Physical Activity: Not on file  Stress: Not on file  Social Connections: Not on file  Intimate Partner Violence: Not on file    Family History: Family History  Problem Relation Age of Onset   Breast cancer Maternal Aunt        dx unknown age   Diabetes Father    Diabetes Paternal Aunt    Breast cancer Cousin        maternal cousin; dx unknown age   Esophageal cancer Neg Hx    Rectal cancer Neg Hx    Stomach cancer Neg Hx    Colon polyps Neg Hx    Colon cancer Neg Hx     Review of Systems: ROS Patient denies any of the following: No respiratory hx/ no sleep apnea / no supplemental oxygen use / She is a former smoker - quit in 2020 No cardiac history, no DVT/PE or varicose veins / no swelling of lower extremities Patient regulates her diet/meds  for chron's disease  Physical Exam: Vital Signs BP 119/78 (BP Location: Left Arm, Patient Position: Sitting, Cuff Size: Normal)   Pulse (!) 102   Ht 5' 4"  (1.626 m)   Wt 146 lb (66.2 kg)   SpO2 100%   BMI 25.06 kg/m   Physical Exam  Constitutional:      General: Not in acute distress.    Appearance:  Normal appearance. Not ill-appearing.  HENT:     Head: Normocephalic and atraumatic.  Eyes:     Pupils: Pupils are equal, round Neck:     Musculoskeletal: Normal range of motion.  Cardiovascular:     Rate and Rhythm: Normal rate    Pulses: Normal pulses.  Pulmonary:     Effort: Pulmonary effort is normal. No respiratory distress.  Abdominal:     General: Abdomen is flat. There is no distension.  Musculoskeletal: Normal range of motion.  Skin:    General:  Skin is warm and dry.     Findings: No erythema or rash.  Neurological:     General: No focal deficit present.     Mental Status: Alert and oriented to person, place, and time. Mental status is at baseline.     Motor: No weakness.  Psychiatric:        Mood and Affect: Mood normal.        Behavior: Behavior normal.    Assessment/Plan: The patient is scheduled for bilateral breast reconstruction with tissue expanders & acellular dermal matrix with Dr. Claudia Desanctis.  Risks, benefits, and alternatives of procedure discussed, questions answered and consent obtained.    Smoking Status: former smoker; Counseling Given? yes Last Mammogram: 2022; Results: right breast cancer  Caprini Score: 8; Risk Factors include: hx of cancer, hx of chron's, age  and length of planned surgery. Recommendation for mechanical SCD's pharmacological prophylaxis. Encourage early ambulation.   Pictures obtained: yes  Post-op Rx sent to pharmacy: Dr. Claudia Desanctis  Patient was provided with the  General Surgical Risk consent document and Pain Medication Agreement prior to their appointment.  They had adequate time to read through the risk consent documents and Pain Medication Agreement. We also discussed them in person together during this preop appointment. All of their questions were answered to their satisfaction.  Recommended calling if they have any further questions.  Risk consent form and Pain Medication Agreement to be scanned into patient's chart.  Note:  patient  refuses all blood products   Electronically signed by: [place], RN 11/07/2020 8:01 PM

## 2020-11-08 ENCOUNTER — Other Ambulatory Visit: Payer: Self-pay | Admitting: General Surgery

## 2020-11-09 ENCOUNTER — Encounter (HOSPITAL_BASED_OUTPATIENT_CLINIC_OR_DEPARTMENT_OTHER): Payer: Self-pay | Admitting: General Surgery

## 2020-11-09 ENCOUNTER — Other Ambulatory Visit: Payer: Self-pay

## 2020-11-09 ENCOUNTER — Ambulatory Visit (HOSPITAL_BASED_OUTPATIENT_CLINIC_OR_DEPARTMENT_OTHER): Payer: Managed Care, Other (non HMO) | Admitting: Anesthesiology

## 2020-11-09 ENCOUNTER — Observation Stay (HOSPITAL_BASED_OUTPATIENT_CLINIC_OR_DEPARTMENT_OTHER)
Admission: RE | Admit: 2020-11-09 | Discharge: 2020-11-10 | Disposition: A | Discharge status: Home / Self Care | Payer: Managed Care, Other (non HMO) | Attending: Plastic Surgery | Admitting: Plastic Surgery

## 2020-11-09 ENCOUNTER — Encounter (HOSPITAL_BASED_OUTPATIENT_CLINIC_OR_DEPARTMENT_OTHER)
Admission: RE | Disposition: A | Discharge status: Home / Self Care | Payer: Self-pay | Source: Home / Self Care | Attending: Plastic Surgery

## 2020-11-09 DIAGNOSIS — Z87891 Personal history of nicotine dependence: Secondary | ICD-10-CM | POA: Diagnosis not present

## 2020-11-09 DIAGNOSIS — Z170 Estrogen receptor positive status [ER+]: Secondary | ICD-10-CM

## 2020-11-09 DIAGNOSIS — N6022 Fibroadenosis of left breast: Secondary | ICD-10-CM | POA: Diagnosis not present

## 2020-11-09 DIAGNOSIS — C50911 Malignant neoplasm of unspecified site of right female breast: Principal | ICD-10-CM | POA: Insufficient documentation

## 2020-11-09 DIAGNOSIS — N6042 Mammary duct ectasia of left breast: Secondary | ICD-10-CM | POA: Insufficient documentation

## 2020-11-09 DIAGNOSIS — C50411 Malignant neoplasm of upper-outer quadrant of right female breast: Secondary | ICD-10-CM

## 2020-11-09 DIAGNOSIS — C50919 Malignant neoplasm of unspecified site of unspecified female breast: Secondary | ICD-10-CM | POA: Diagnosis present

## 2020-11-09 HISTORY — PX: BREAST RECONSTRUCTION WITH PLACEMENT OF TISSUE EXPANDER AND FLEX HD (ACELLULAR HYDRATED DERMIS): SHX6295

## 2020-11-09 HISTORY — DX: Malignant (primary) neoplasm, unspecified: C80.1

## 2020-11-09 HISTORY — PX: NIPPLE SPARING MASTECTOMY: SHX6537

## 2020-11-09 HISTORY — PX: AXILLARY LYMPH NODE DISSECTION: SHX5229

## 2020-11-09 LAB — POCT PREGNANCY, URINE: Preg Test, Ur: NEGATIVE

## 2020-11-09 SURGERY — MASTECTOMY, NIPPLE SPARING
Anesthesia: General | Site: Breast | Laterality: Right

## 2020-11-09 MED ORDER — ROCURONIUM BROMIDE 100 MG/10ML IV SOLN
INTRAVENOUS | Status: DC | PRN
  Administered 2020-11-09: 70 mg via INTRAVENOUS

## 2020-11-09 MED ORDER — DIPHENHYDRAMINE HCL 50 MG/ML IJ SOLN: 25.0000 mg | Freq: Four times a day (QID) | INTRAMUSCULAR | Status: DC | PRN

## 2020-11-09 MED ORDER — DEXAMETHASONE SODIUM PHOSPHATE 4 MG/ML IJ SOLN
INTRAMUSCULAR | Status: DC | PRN
  Administered 2020-11-09: 5 mg via INTRAVENOUS

## 2020-11-09 MED ORDER — EPHEDRINE 5 MG/ML INJ
INTRAVENOUS | Status: AC
  Filled 2020-11-09: qty 10

## 2020-11-09 MED ORDER — CEFAZOLIN SODIUM-DEXTROSE 2-4 GM/100ML-% IV SOLN: 2.0000 g | INTRAVENOUS | Status: DC

## 2020-11-09 MED ORDER — CHLORHEXIDINE GLUCONATE CLOTH 2 % EX PADS: 6.0000 | MEDICATED_PAD | Freq: Once | CUTANEOUS | Status: DC

## 2020-11-09 MED ORDER — ROPIVACAINE HCL 5 MG/ML IJ SOLN
INTRAMUSCULAR | Status: DC | PRN
  Administered 2020-11-09: 50 mL via PERINEURAL

## 2020-11-09 MED ORDER — MORPHINE SULFATE (PF) 4 MG/ML IV SOLN
1.0000 mg | INTRAVENOUS | Status: DC | PRN
Start: 2020-11-09 — End: 2020-11-10

## 2020-11-09 MED ORDER — ACETAMINOPHEN 500 MG PO TABS
ORAL_TABLET | ORAL | Status: AC
  Filled 2020-11-09: qty 2

## 2020-11-09 MED ORDER — SODIUM CHLORIDE 0.9 % IV SOLN
INTRAVENOUS | Status: DC | PRN
  Administered 2020-11-09: 500 mL

## 2020-11-09 MED ORDER — ACETAMINOPHEN 325 MG RE SUPP: 650.0000 mg | Freq: Four times a day (QID) | RECTAL | Status: DC | PRN

## 2020-11-09 MED ORDER — EPHEDRINE SULFATE 50 MG/ML IJ SOLN
INTRAMUSCULAR | Status: DC | PRN
  Administered 2020-11-09: 10 mg via INTRAVENOUS

## 2020-11-09 MED ORDER — SODIUM CHLORIDE 0.9 % IV SOLN
INTRAVENOUS | Status: AC
  Filled 2020-11-09: qty 10

## 2020-11-09 MED ORDER — HEPARIN SOD (PORK) LOCK FLUSH 100 UNIT/ML IV SOLN
INTRAVENOUS | Status: AC
  Filled 2020-11-09: qty 5

## 2020-11-09 MED ORDER — METHYLENE BLUE 0.5 % INJ SOLN
INTRAVENOUS | Status: AC
  Filled 2020-11-09: qty 10

## 2020-11-09 MED ORDER — MIDAZOLAM HCL 5 MG/5ML IJ SOLN
INTRAMUSCULAR | Status: DC | PRN
  Administered 2020-11-09: 1 mg via INTRAVENOUS

## 2020-11-09 MED ORDER — MIDAZOLAM HCL 2 MG/2ML IJ SOLN
2.0000 mg | Freq: Once | INTRAMUSCULAR | Status: AC
  Administered 2020-11-09: 2 mg via INTRAVENOUS

## 2020-11-09 MED ORDER — ONDANSETRON HCL 4 MG/2ML IJ SOLN
INTRAMUSCULAR | Status: DC | PRN
  Administered 2020-11-09: 4 mg via INTRAVENOUS

## 2020-11-09 MED ORDER — FENTANYL CITRATE (PF) 100 MCG/2ML IJ SOLN
INTRAMUSCULAR | Status: DC | PRN
  Administered 2020-11-09: 50 ug via INTRAVENOUS

## 2020-11-09 MED ORDER — ENSURE PRE-SURGERY PO LIQD: 296.0000 mL | Freq: Once | ORAL | Status: DC

## 2020-11-09 MED ORDER — AMISULPRIDE (ANTIEMETIC) 5 MG/2ML IV SOLN: 10.0000 mg | Freq: Once | INTRAVENOUS | Status: DC | PRN

## 2020-11-09 MED ORDER — HYDROMORPHONE HCL 1 MG/ML IJ SOLN: 0.2500 mg | INTRAMUSCULAR | Status: DC | PRN

## 2020-11-09 MED ORDER — CEFAZOLIN SODIUM-DEXTROSE 2-4 GM/100ML-% IV SOLN
2.0000 g | INTRAVENOUS | Status: AC
  Administered 2020-11-09: 2 g via INTRAVENOUS

## 2020-11-09 MED ORDER — PHENYLEPHRINE 40 MCG/ML (10ML) SYRINGE FOR IV PUSH (FOR BLOOD PRESSURE SUPPORT)
PREFILLED_SYRINGE | INTRAVENOUS | Status: AC
  Filled 2020-11-09: qty 10

## 2020-11-09 MED ORDER — NITROGLYCERIN 2 % TD OINT
TOPICAL_OINTMENT | TRANSDERMAL | Status: DC | PRN
  Administered 2020-11-09: 2 [in_us] via TOPICAL

## 2020-11-09 MED ORDER — ACETAMINOPHEN 325 MG PO TABS: 650.0000 mg | ORAL_TABLET | Freq: Four times a day (QID) | ORAL | Status: DC | PRN

## 2020-11-09 MED ORDER — PROMETHAZINE HCL 25 MG/ML IJ SOLN: 6.2500 mg | INTRAMUSCULAR | Status: DC | PRN

## 2020-11-09 MED ORDER — ROCURONIUM BROMIDE 10 MG/ML (PF) SYRINGE
PREFILLED_SYRINGE | INTRAVENOUS | Status: AC
  Filled 2020-11-09: qty 10

## 2020-11-09 MED ORDER — LACTATED RINGERS IV SOLN: INTRAVENOUS | Status: DC

## 2020-11-09 MED ORDER — HYDROCODONE-ACETAMINOPHEN 5-325 MG PO TABS
1.0000 | ORAL_TABLET | ORAL | Status: DC | PRN
  Administered 2020-11-09 – 2020-11-10 (×4): 2 via ORAL
  Filled 2020-11-09 (×4): qty 2

## 2020-11-09 MED ORDER — NITROGLYCERIN 2 % TD OINT
TOPICAL_OINTMENT | TRANSDERMAL | Status: AC
  Filled 2020-11-09: qty 30

## 2020-11-09 MED ORDER — DIPHENHYDRAMINE HCL 50 MG/ML IJ SOLN
INTRAMUSCULAR | Status: AC
  Filled 2020-11-09: qty 2

## 2020-11-09 MED ORDER — DIPHENHYDRAMINE HCL 50 MG/ML IJ SOLN
INTRAMUSCULAR | Status: DC | PRN
  Administered 2020-11-09: 12.5 mg via INTRAVENOUS

## 2020-11-09 MED ORDER — FENTANYL CITRATE (PF) 100 MCG/2ML IJ SOLN
INTRAMUSCULAR | Status: AC
  Filled 2020-11-09: qty 2

## 2020-11-09 MED ORDER — MIDAZOLAM HCL 2 MG/2ML IJ SOLN
INTRAMUSCULAR | Status: AC
  Filled 2020-11-09: qty 2

## 2020-11-09 MED ORDER — PROCHLORPERAZINE EDISYLATE 10 MG/2ML IJ SOLN: 5.0000 mg | Freq: Four times a day (QID) | INTRAMUSCULAR | Status: DC | PRN

## 2020-11-09 MED ORDER — LORAZEPAM 1 MG PO TABS: 0.5000 mg | ORAL_TABLET | Freq: Four times a day (QID) | ORAL | Status: DC | PRN

## 2020-11-09 MED ORDER — METHOCARBAMOL 500 MG PO TABS
500.0000 mg | ORAL_TABLET | Freq: Four times a day (QID) | ORAL | Status: DC | PRN
  Administered 2020-11-09 (×2): 500 mg via ORAL
  Filled 2020-11-09 (×2): qty 1

## 2020-11-09 MED ORDER — MEPERIDINE HCL 25 MG/ML IJ SOLN
6.2500 mg | INTRAMUSCULAR | Status: DC | PRN
Start: 2020-11-09 — End: 2020-11-10

## 2020-11-09 MED ORDER — ONDANSETRON HCL 4 MG/2ML IJ SOLN: 4.0000 mg | Freq: Four times a day (QID) | INTRAMUSCULAR | Status: DC | PRN

## 2020-11-09 MED ORDER — SUCCINYLCHOLINE CHLORIDE 200 MG/10ML IV SOSY
PREFILLED_SYRINGE | INTRAVENOUS | Status: AC
  Filled 2020-11-09: qty 10

## 2020-11-09 MED ORDER — OXYCODONE HCL 5 MG PO TABS: 5.0000 mg | ORAL_TABLET | Freq: Once | ORAL | Status: DC | PRN

## 2020-11-09 MED ORDER — KETOROLAC TROMETHAMINE 15 MG/ML IJ SOLN
INTRAMUSCULAR | Status: AC
  Filled 2020-11-09: qty 1

## 2020-11-09 MED ORDER — KETOROLAC TROMETHAMINE 15 MG/ML IJ SOLN
15.0000 mg | INTRAMUSCULAR | Status: AC
  Administered 2020-11-09: 15 mg via INTRAVENOUS

## 2020-11-09 MED ORDER — DEXAMETHASONE SODIUM PHOSPHATE 10 MG/ML IJ SOLN
INTRAMUSCULAR | Status: AC
  Filled 2020-11-09: qty 1

## 2020-11-09 MED ORDER — PHENYLEPHRINE HCL (PRESSORS) 10 MG/ML IV SOLN
INTRAVENOUS | Status: DC | PRN
  Administered 2020-11-09 (×2): 80 ug via INTRAVENOUS

## 2020-11-09 MED ORDER — ACETAMINOPHEN 500 MG PO TABS
1000.0000 mg | ORAL_TABLET | ORAL | Status: AC
  Administered 2020-11-09: 1000 mg via ORAL

## 2020-11-09 MED ORDER — SUGAMMADEX SODIUM 500 MG/5ML IV SOLN
INTRAVENOUS | Status: AC
  Filled 2020-11-09: qty 5

## 2020-11-09 MED ORDER — ONDANSETRON 4 MG PO TBDP
4.0000 mg | ORAL_TABLET | Freq: Four times a day (QID) | ORAL | Status: DC | PRN
  Administered 2020-11-10: 4 mg via ORAL
  Filled 2020-11-09: qty 1

## 2020-11-09 MED ORDER — ONDANSETRON HCL 4 MG/2ML IJ SOLN
INTRAMUSCULAR | Status: AC
  Filled 2020-11-09: qty 2

## 2020-11-09 MED ORDER — OXYCODONE HCL 5 MG/5ML PO SOLN: 5.0000 mg | Freq: Once | ORAL | Status: DC | PRN

## 2020-11-09 MED ORDER — PROCHLORPERAZINE MALEATE 10 MG PO TABS
10.0000 mg | ORAL_TABLET | Freq: Four times a day (QID) | ORAL | Status: DC | PRN
  Filled 2020-11-09: qty 1

## 2020-11-09 MED ORDER — LIDOCAINE HCL (PF) 2 % IJ SOLN
INTRAMUSCULAR | Status: AC
  Filled 2020-11-09: qty 5

## 2020-11-09 MED ORDER — SODIUM CHLORIDE (PF) 0.9 % IJ SOLN
INTRAVENOUS | Status: DC | PRN
  Administered 2020-11-09: 3 mL via INTRAMUSCULAR

## 2020-11-09 MED ORDER — SUGAMMADEX SODIUM 200 MG/2ML IV SOLN
INTRAVENOUS | Status: DC | PRN
  Administered 2020-11-09: 150 mg via INTRAVENOUS

## 2020-11-09 MED ORDER — HEPARIN (PORCINE) IN NACL 1000-0.9 UT/500ML-% IV SOLN
INTRAVENOUS | Status: AC
  Filled 2020-11-09: qty 500

## 2020-11-09 MED ORDER — FENTANYL CITRATE (PF) 100 MCG/2ML IJ SOLN
100.0000 ug | Freq: Once | INTRAMUSCULAR | Status: AC
  Administered 2020-11-09: 100 ug via INTRAVENOUS

## 2020-11-09 MED ORDER — PROPOFOL 10 MG/ML IV BOLUS
INTRAVENOUS | Status: DC | PRN
  Administered 2020-11-09: 150 mg via INTRAVENOUS

## 2020-11-09 MED ORDER — SODIUM CHLORIDE (PF) 0.9 % IJ SOLN
INTRAMUSCULAR | Status: AC
  Filled 2020-11-09: qty 10

## 2020-11-09 MED ORDER — LIDOCAINE HCL (CARDIAC) PF 100 MG/5ML IV SOSY
PREFILLED_SYRINGE | INTRAVENOUS | Status: DC | PRN
  Administered 2020-11-09: 50 mg via INTRAVENOUS

## 2020-11-09 MED ORDER — DIPHENHYDRAMINE HCL 25 MG PO CAPS: 25.0000 mg | ORAL_CAPSULE | Freq: Four times a day (QID) | ORAL | Status: DC | PRN

## 2020-11-09 MED ORDER — CEFAZOLIN SODIUM-DEXTROSE 2-4 GM/100ML-% IV SOLN
INTRAVENOUS | Status: AC
  Filled 2020-11-09: qty 100

## 2020-11-09 MED ORDER — PROPOFOL 500 MG/50ML IV EMUL
INTRAVENOUS | Status: DC | PRN
  Administered 2020-11-09: 15 ug/kg/min via INTRAVENOUS

## 2020-11-09 SURGICAL SUPPLY — 74 items
APPLIER CLIP 11 MED OPEN (CLIP) ×3
APPLIER CLIP 9.375 MED OPEN (MISCELLANEOUS) ×3
BAG DECANTER FOR FLEXI CONT (MISCELLANEOUS) ×3 IMPLANT
BENZOIN TINCTURE PRP APPL 2/3 (GAUZE/BANDAGES/DRESSINGS) ×6 IMPLANT
BIOPATCH RED 1 DISK 7.0 (GAUZE/BANDAGES/DRESSINGS) ×9 IMPLANT
BLADE SURG 10 STRL SS (BLADE) ×6 IMPLANT
BLADE SURG 15 STRL LF DISP TIS (BLADE) ×4 IMPLANT
BLADE SURG 15 STRL SS (BLADE) ×6
BNDG ELASTIC 6X10 VLCR STRL LF (GAUZE/BANDAGES/DRESSINGS) ×3 IMPLANT
CANISTER SUCT 1200ML W/VALVE (MISCELLANEOUS) ×3 IMPLANT
CHLORAPREP W/TINT 26 (MISCELLANEOUS) ×6 IMPLANT
CLIP APPLIE 11 MED OPEN (CLIP) ×2 IMPLANT
CLIP APPLIE 9.375 MED OPEN (MISCELLANEOUS) ×2 IMPLANT
COVER BACK TABLE 60X90IN (DRAPES) ×3 IMPLANT
COVER MAYO STAND STRL (DRAPES) ×6 IMPLANT
DRAIN CHANNEL 15F RND FF W/TCR (WOUND CARE) ×9 IMPLANT
DRAPE TOP ARMCOVERS (MISCELLANEOUS) ×3 IMPLANT
DRAPE U-SHAPE 76X120 STRL (DRAPES) ×3 IMPLANT
DRAPE UTILITY XL STRL (DRAPES) ×6 IMPLANT
DRSG PAD ABDOMINAL 8X10 ST (GAUZE/BANDAGES/DRESSINGS) ×12 IMPLANT
DRSG TEGADERM 2-3/8X2-3/4 SM (GAUZE/BANDAGES/DRESSINGS) ×9 IMPLANT
DRSG TEGADERM 4X10 (GAUZE/BANDAGES/DRESSINGS) IMPLANT
ELECT BLADE 4.0 EZ CLEAN MEGAD (MISCELLANEOUS) ×6
ELECT REM PT RETURN 9FT ADLT (ELECTROSURGICAL) ×3
ELECTRODE BLDE 4.0 EZ CLN MEGD (MISCELLANEOUS) ×4 IMPLANT
ELECTRODE REM PT RTRN 9FT ADLT (ELECTROSURGICAL) ×2 IMPLANT
EVACUATOR SILICONE 100CC (DRAIN) ×9 IMPLANT
GAUZE SPONGE 4X4 12PLY STRL (GAUZE/BANDAGES/DRESSINGS) ×3 IMPLANT
GLOVE SRG 8 PF TXTR STRL LF DI (GLOVE) ×2 IMPLANT
GLOVE SURG ENC MOIS LTX SZ6.5 (GLOVE) ×6 IMPLANT
GLOVE SURG ENC MOIS LTX SZ7 (GLOVE) ×3 IMPLANT
GLOVE SURG ENC TEXT LTX SZ7.5 (GLOVE) ×3 IMPLANT
GLOVE SURG UNDER POLY LF SZ7.5 (GLOVE) ×3 IMPLANT
GLOVE SURG UNDER POLY LF SZ8 (GLOVE) ×3
GOWN STRL REUS W/ TWL LRG LVL3 (GOWN DISPOSABLE) ×8 IMPLANT
GOWN STRL REUS W/TWL LRG LVL3 (GOWN DISPOSABLE) ×12
GRAFT FLEX HD 19X22X0.7-1.4 (Tissue) ×6 IMPLANT
ILLUMINATOR WAVEGUIDE N/F (MISCELLANEOUS) ×3 IMPLANT
IMPLANT BREAST 375CC (Breast) ×6 IMPLANT
NEEDLE HYPO 25X1 1.5 SAFETY (NEEDLE) ×3 IMPLANT
NS IRRIG 1000ML POUR BTL (IV SOLUTION) ×3 IMPLANT
PACK BASIN DAY SURGERY FS (CUSTOM PROCEDURE TRAY) ×6 IMPLANT
PACK SPY-PHI (KITS) ×3 IMPLANT
PENCIL SMOKE EVACUATOR (MISCELLANEOUS) ×6 IMPLANT
PIN SAFETY STERILE (MISCELLANEOUS) ×6 IMPLANT
SHEET MEDIUM DRAPE 40X70 STRL (DRAPES) ×6 IMPLANT
SLEEVE SCD COMPRESS KNEE MED (STOCKING) ×3 IMPLANT
SPONGE T-LAP 18X18 ~~LOC~~+RFID (SPONGE) ×9 IMPLANT
STAPLER INSORB 30 2030 C-SECTI (MISCELLANEOUS) ×3 IMPLANT
STAPLER VISISTAT 35W (STAPLE) ×3 IMPLANT
STRIP CLOSURE SKIN 1/2X4 (GAUZE/BANDAGES/DRESSINGS) ×6 IMPLANT
SUT ETHILON 2 0 FS 18 (SUTURE) IMPLANT
SUT ETHILON 3 0 PS 1 (SUTURE) ×9 IMPLANT
SUT MNCRL AB 3-0 PS2 18 (SUTURE) IMPLANT
SUT MNCRL AB 4-0 PS2 18 (SUTURE) IMPLANT
SUT MON AB 3-0 SH 27 (SUTURE)
SUT MON AB 3-0 SH27 (SUTURE) IMPLANT
SUT MON AB 4-0 PC3 18 (SUTURE) IMPLANT
SUT MON AB 5-0 PS2 18 (SUTURE) ×3 IMPLANT
SUT PDS 3-0 CT2 (SUTURE) ×18
SUT PDS II 3-0 CT2 27 ABS (SUTURE) ×12 IMPLANT
SUT SILK 2 0 SH (SUTURE) ×3 IMPLANT
SUT VIC AB 3-0 SH 27 (SUTURE) ×6
SUT VIC AB 3-0 SH 27X BRD (SUTURE) ×4 IMPLANT
SUT VICRYL 4-0 PS2 18IN ABS (SUTURE) IMPLANT
SUT VLOC 90 P-14 23 (SUTURE) ×6 IMPLANT
SYR 50ML LL SCALE MARK (SYRINGE) ×3 IMPLANT
SYR BULB IRRIG 60ML STRL (SYRINGE) ×3 IMPLANT
SYR CONTROL 10ML LL (SYRINGE) ×3 IMPLANT
TOWEL GREEN STERILE FF (TOWEL DISPOSABLE) ×6 IMPLANT
TRAY FOLEY W/BAG SLVR 14FR LF (SET/KITS/TRAYS/PACK) ×3 IMPLANT
TUBE CONNECTING 20X1/4 (TUBING) ×3 IMPLANT
UNDERPAD 30X36 HEAVY ABSORB (UNDERPADS AND DIAPERS) ×6 IMPLANT
YANKAUER SUCT BULB TIP NO VENT (SUCTIONS) ×6 IMPLANT

## 2020-11-09 NOTE — Anesthesia Postprocedure Evaluation (Signed)
Anesthesia Post Note  Patient: [name]  Procedure(s) Performed: BILATERAL NIPPLE SPARING MASTECTOMY (Bilateral: Breast) RIGHT AXILLARY LYMPH NODE DISSECTION (Right: Axilla) BILATERAL BREAST RECONSTRUCTION WITH PLACEMENT OF TISSUE EXPANDER AND FLEX HD (ACELLULAR HYDRATED DERMIS) (Bilateral: Breast)     Patient location during evaluation: PACU Anesthesia Type: General Level of consciousness: awake and alert Pain management: pain level controlled Vital Signs Assessment: post-procedure vital signs reviewed and stable Respiratory status: spontaneous breathing, nonlabored ventilation and respiratory function stable Cardiovascular status: blood pressure returned to baseline and stable Postop Assessment: no apparent nausea or vomiting Anesthetic complications: no   No notable events documented.  Last Vitals:  Vitals:   11/09/20 1145 11/09/20 1200  BP: 112/78 114/80  Pulse: (!) 107 (!) 106  Resp: 13 17  Temp:    SpO2: 98% 98%    Last Pain:  Vitals:   11/09/20 1200  TempSrc:   PainSc: 1                  Lynda Rainwater

## 2020-11-09 NOTE — Anesthesia Procedure Notes (Signed)
Anesthesia Regional Block: Pectoralis block   Pre-Anesthetic Checklist: , timeout performed,  Correct Patient, Correct Site, Correct Laterality,  Correct Procedure, Correct Position, site marked,  Risks and benefits discussed,  Surgical consent,  Pre-op evaluation,  At surgeon's request and post-op pain management  Laterality: Left and Right  Prep: chloraprep       Needles:  Injection technique: Single-shot  Needle Type: Stimiplex     Needle Length: 9cm  Needle Gauge: 21     Additional Needles:   Procedures:,,,, ultrasound used (permanent image in chart),,    Narrative:  Start time: 11/09/2020 7:11 AM End time: 11/09/2020 7:16 AM Injection made incrementally with aspirations every 5 mL.  Performed by: Personally  Anesthesiologist: Lynda Rainwater, MD

## 2020-11-09 NOTE — Op Note (Signed)
Operative Note   DATE OF OPERATION: 11/09/2020  SURGICAL DEPARTMENT: Plastic Surgery  PREOPERATIVE DIAGNOSES: Breast cancer  POSTOPERATIVE DIAGNOSES:  same  PROCEDURE: 1.  Bilateral breast reconstruction with tissue expanders and acellular dermal matrix 2.  Indocyanine green angiography bilateral mastectomy flaps  SURGEON: Talmadge Coventry, MD  ASSISTANT: [place], RNFA The advanced practice practitioner (APP) assisted throughout the case.  The APP was essential in retraction and counter traction when needed to make the case progress smoothly.  This retraction and assistance made it possible to see the tissue plans for the procedure.  The assistance was needed for blood control, tissue re-approximation and assisted with closure of the incision site.  ANESTHESIA:  General.   COMPLICATIONS: None.   INDICATIONS FOR PROCEDURE:  The patient, [name] is a 45 y.o. female born on Mar 01, 1976, is here for treatment of bilateral mastectomy defects MRN: [id number]  CONSENT:  Informed consent was obtained directly from the patient. Risks, benefits and alternatives were fully discussed. Specific risks including but not limited to bleeding, infection, hematoma, seroma, scarring, pain, contracture, asymmetry, wound healing problems, and need for further surgery were all discussed. The patient did have an ample opportunity to have questions answered to satisfaction.   DESCRIPTION OF PROCEDURE:  The patient was taken to the operating room. SCDs were placed and antibiotics were given.  General anesthesia was administered.  The patient's operative site was prepped and draped in a sterile fashion. A time out was performed and all information was confirmed to be correct.  [name] performed his portion of the case which included bilateral nipple sparing mastectomies and a right axillary dissection.  His part will be dictated separately.  I started by evaluating the skin flaps and they look to  be healthy.  Indocyanine green angiography was then performed.  The left side filled beautifully.  There was some delayed filling on the right side but it ultimately filled completely with the exception of a focal area of the nipple areolar complex itself.  This was about 1 cm in size.  I then inspected both pockets and ensured meticulous hemostasis.  0 V-Loc was used to tack down the lateral tissues on both sides to close off the dead space.  A 15 French drain was placed into the axilla and secured with a nylon suture.  The axilla was then closed with interrupted buried 3-0 PDS sutures, insorb stapler and 3 OV lock for the for the skin.  I then placed 15 French drains in both breast pockets and secured them with nylon sutures.  The Flex HD was then brought onto the field and soaked in saline.  This was a Flex HD pliable pre-Peace 1 for each side.  3-0 PDS was then run around the periphery as a pursestring.  The expanders were then brought onto the field.  I elected to use Mentor Arturo high-profile smooth expanders with a 375 cc volume.  Serial number for the left side is [id number].  Serial number for the right side is [id number].  All the air was removed the expanders and they were then placed within the matrix and the pursestring was tied down.  Suture tabs were then brought out at 3 6 and 9:00.  3-0 PDS stay sutures were then placed to the chest wall at 3 6 and 9:00 in the locations that would correspond to those suture tabs in the prepectoral pocket.  The stay sutures were then run through the suture tabs and the expanders were then  placed within the pocket after irrigating copiously with triple antibiotic solution.  Stay sutures were tied down to secure the implants to the chest wall and 150 cc of air were then placed into both expanders which put no tension on the skin closure.  The inframammary crease incision was then closed with insorb stapler and 3 OV lock.  Steri-Strips were then applied along with  some Nitropaste to the right nipple areolar complex.  The patient tolerated the procedure well.  There were no complications. The patient was allowed to wake from anesthesia, extubated and taken to the recovery room in satisfactory condition.

## 2020-11-09 NOTE — Brief Op Note (Signed)
11/09/2020  10:57 AM  PATIENT:  [name]  45 y.o. female  PRE-OPERATIVE DIAGNOSIS:  BREAST CANCER  POST-OPERATIVE DIAGNOSIS:  BREAST CANCER  PROCEDURE:  Procedure(s): BILATERAL NIPPLE SPARING MASTECTOMY (Bilateral) RIGHT AXILLARY LYMPH NODE DISSECTION (Right) BILATERAL BREAST RECONSTRUCTION WITH PLACEMENT OF TISSUE EXPANDER AND FLEX HD (ACELLULAR HYDRATED DERMIS) (Bilateral)  SURGEON:  Surgeon(s) and Role: Panel 1:    [name], MD - Primary Panel 2:    * [name], MD - Primary  PHYSICIAN ASSISTANT: [place], RNFA  ASSISTANTS: none   ANESTHESIA:   general  EBL:  225 mL   BLOOD ADMINISTERED:none  DRAINS: (3) Jackson-Pratt drain(s) with closed bulb suction in the chest    LOCAL MEDICATIONS USED:  MARCAINE     SPECIMEN:  No Specimen  DISPOSITION OF SPECIMEN:  PATHOLOGY  COUNTS:  YES  TOURNIQUET:  * No tourniquets in log *  DICTATION: .Dragon Dictation  PLAN OF CARE: Observation  PATIENT DISPOSITION:  PACU - hemodynamically stable.   Delay start of Pharmacological VTE agent (>24hrs) due to surgical blood loss or risk of bleeding: not applicable

## 2020-11-09 NOTE — Op Note (Signed)
Preoperative diagnosis: Node positive right breast cancer status post primary chemotherapy Postoperative diagnosis: Same as above Procedure: 1.  Left risk reducing nipple sparing mastectomy 2.  Right nipple sparing mastectomy 3.  Right axillary lymph node dissection Surgeon: Dr. Serita Grammes Anesthesia: General with bilateral pectoral blocks Estimated blood loss: 50 cc Specimens: 1.  Left breast tissue marked short superior, long lateral, double deep 2.  Left nipple biopsy 3.  Right axillary lymph nodes 4.  Right nipple sparing mastectomy marked short superior, long lateral, double deep 5.  Right nipple biopsy Complications: None Drains: Per plastic surgery Sponge needle count was correct Dispo case turned over to plastic surgery for reconstruction  Indications:44 yof who has fh in maternal aunt who had a palpable mass noted on her own exam.  she has d density mammogram.  she had masses at 9 oclock measuring 2.1 cm and 10 oclock 1.9 cm and numerous other masses from 8 to 10.  there is one abnormal axillary node.  the clips are 4.7 cm apart.  MRI shows a 7.9 cm contiguous mass that goes to nipple, skin thickening, enlarged right axillary node, there is also a left loq mass that needs mr biopsy.  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 and Ki is 30%. She has completed chemotherapy. Repeat mri shows a good response with only small amount of enhancement.  The prior biopsied node is smaller.  We discussed all of the options and have elected to attempt a nipple sparing mastectomy on the right side due to what appears to be a really good response to chemotherapy.  She also desires a risk reducing mastectomy on the other side which is not unreasonable given her young age.  Even though her lymph nodes look better on her MRI I still feel a palpable lymph node and I discussed a low level axillary lymph node dissection.  Procedure: After  informed consent was obtained the patient first underwent bilateral pectoral blocks.  She was given antibiotics.  SCDs were in place.  She was placed under general anesthesia without complication.  A Foley catheter was placed.  She was prepped and draped in the standard sterile surgical fashion.  A surgical timeout was then performed.  I made an inframammary incision 8 cm from the xiphoid that extended 10 cm in the inframammary fold.  I then located the pectoralis muscle.  I then created the posterior flap to include the pectoralis fascia to the parasternal area, clavicle, and latissimus.  I then created the anterior flap with cautery to the similar margins and removed all of the breast tissue.  I trimmed the flaps to ensure that all the breast tissue had been removed.  I then inverted the nipple and remove this and sent it off as a specimen.  Hemostasis was obtained.  I then packed this side and went to the right.  I first did the axillary lymph nodes.  I made an incision below the axillary hairline.  I carried this through the axillary fascia.  I she did a portion of the mastectomy and created this flap from this incision is well in the upper outer quadrant.  I then entered in the axilla.  There were numerous lymph nodes that were visible and palpable.  I ended up identifying the axillary vein and removed all of the palpable lymph nodes caudad to that which was a low level axillary lymph node dissection.  The nerves in the vein were both preserved.  There was no other palpable disease present upon completion.  Hemostasis was obtained.  I then packed this.  I then did the nipple sparing mastectomy on the side.  I made a similar incision to the other side.  I created the posterior flap and anterior flap in a similar fashion.  I then remove the breast tissue marked it as above.  I trimmed the flaps again and remove the nipple on the side.  There was a small rent in the nipple that I repaired with 5-0 Monocryl.  I  packed this.  This  case was then turned over to plastic surgery for reconstruction.

## 2020-11-09 NOTE — Brief Op Note (Signed)
11/09/2020  9:55 AM  PATIENT:  [name]  45 y.o. female  PRE-OPERATIVE DIAGNOSIS:  BREAST CANCER  POST-OPERATIVE DIAGNOSIS:  BREAST CANCER  PROCEDURE:  Procedure(s): BILATERAL NIPPLE SPARING MASTECTOMY (Bilateral) RIGHT AXILLARY LYMPH NODE DISSECTION (Right) BILATERAL BREAST RECONSTRUCTION WITH PLACEMENT OF TISSUE EXPANDER AND FLEX HD (ACELLULAR HYDRATED DERMIS) (Bilateral)  SURGEON:  Surgeon(s) and Role: Panel 1:    [name], MD - Primary Panel 2:    * [name], MD - Primary  PHYSICIAN ASSISTANT:   ASSISTANTS: none   ANESTHESIA:   general with pec blocks  EBL:  225 mL   BLOOD ADMINISTERED:none  DRAINS:  per plastic srugery    LOCAL MEDICATIONS USED:  NONE  SPECIMEN:  Mastectomy with ALND right , left nsm  DISPOSITION OF SPECIMEN:  PATHOLOGY  COUNTS:  YES  TOURNIQUET:  * No tourniquets in log *  DICTATION: .Dragon Dictation  PLAN OF CARE: Admit for overnight observation  PATIENT DISPOSITION:   case turned over to plastic surgery   Delay start of Pharmacological VTE agent (>24hrs) due to surgical blood loss or risk of bleeding: not applicable

## 2020-11-09 NOTE — Interval H&P Note (Signed)
History and Physical Interval Note:  11/09/2020 6:57 AM  [name]  has presented today for surgery, with the diagnosis of BREAST CANCER.  The various methods of treatment have been discussed with the patient and family. After consideration of risks, benefits and other options for treatment, the patient has consented to  Procedure(s): BILATERAL NIPPLE SPARING MASTECTOMY (Bilateral) RIGHT AXILLARY LYMPH NODE DISSECTION (Right) BILATERAL BREAST RECONSTRUCTION WITH PLACEMENT OF TISSUE EXPANDER AND FLEX HD (ACELLULAR HYDRATED DERMIS) (Bilateral) as a surgical intervention.  The patient's history has been reviewed, patient examined, no change in status, stable for surgery.  I have reviewed the patient's chart and labs.  Questions were answered to the patient's satisfaction.     [name]

## 2020-11-09 NOTE — H&P (Signed)
[name] is an 45 y.o. female.   Chief Complaint: breast cancer HPI: 27 yof who has fh in maternal aunt who had a palpable mass noted on her own exam.  she has d density mammogram.  she had masses at 9 oclock measuring 2.1 cm and 10 oclock 1.9 cm and numerous other masses from 8 to 10.  there is one abnormal axillary node.  the clips are 4.7 cm apart.  MRI shows a 7.9 cm contiguous mass that goes to nipple, skin thickening, enlarged right axillary node, there is also a left loq mass that needs mr biopsy.  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 and Ki is 30%. She has completed chemotherapy. Repeat mri shows a good response with only small amount of enhancement.  The prior biopsied node is smaller.     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 10/17/2010   Rectal bleeding    Uterine fibroid    Vitamin D deficiency 09/24/2013    Past Surgical History:  Procedure Laterality Date   BIOPSY  07/01/2020   Procedure: BIOPSY;  Surgeon: Yetta Flock, MD;  Location: [facility];  Service: Gastroenterology;;   COLECTOMY  06/27/2019   COLONOSCOPY  2015   FLEXIBLE SIGMOIDOSCOPY N/A 07/01/2020   Procedure: Otho Darner SIGMOIDOSCOPY;  Surgeon: Yetta Flock, MD;  Location: [facility];  Service: Gastroenterology;  Laterality: N/A;   PORTACATH PLACEMENT N/A 06/23/2020   Procedure: INSERTION PORT-A-CATH;  Surgeon: [name], MD;  Location: [facility];  Service: General;  Laterality: N/A;  START TIME OF 11:00 AM FOR 17 MINUTES Anshika Pethtel IQ    Family History  Problem Relation Age of Onset   Breast cancer Maternal Aunt        dx unknown age   Diabetes Father    Diabetes Paternal Aunt    Breast cancer  Cousin        maternal cousin; dx unknown age   Esophageal cancer Neg Hx    Rectal cancer Neg Hx    Stomach cancer Neg Hx    Colon polyps Neg Hx    Colon cancer Neg Hx    Social History:  reports that she has quit smoking. Her smoking use included cigarettes. She has never used smokeless tobacco. She reports current alcohol use of about 1.0 - 2.0 standard drink of alcohol per week. She reports that she does not use drugs.  Allergies:  Allergies  Allergen Reactions   Other     BLOOD PRODUCT REFUSAL    Medications Prior to Admission  Medication Sig Dispense Refill   ferrous sulfate 325 (65 FE) MG tablet Take 325 mg by mouth daily with breakfast.     lidocaine-prilocaine (EMLA) cream Apply 1 application topically as needed (Prior to port access). 30 g 2   mesalamine (LIALDA) 1.2 g EC tablet Take 2 tablets (2.4 g total) by mouth 2 (two) times daily with a meal. 120 tablet 11   Multiple Vitamin (MULTIVITAMIN) tablet Take 1 tablet by mouth daily.     dexamethasone (DECADRON) 4 MG tablet Take 2 tablets (8 mg total) by mouth 2 (two) times daily. Start the day before Taxotere. Then take daily x 3  days after chemotherapy. 30 tablet 1   diphenoxylate-atropine (LOMOTIL) 2.5-0.025 MG tablet 1 to 2 tablets before meals and also use at bedtime if needed 90 tablet 1   loratadine (CLARITIN) 10 MG tablet Take 10 mg by mouth See admin instructions. Take 7 days starting the day of injection     LORazepam (ATIVAN) 0.5 MG tablet Take 1 tablet (0.5 mg total) by mouth every 6 (six) hours as needed (Nausea or vomiting). 30 tablet 0   ondansetron (ZOFRAN) 8 MG tablet Take 1 tablet (8 mg total) by mouth 2 (two) times daily as needed (Nausea or vomiting). Start on the third day after chemotherapy. 30 tablet 1   prochlorperazine (COMPAZINE) 10 MG tablet Take 1 tablet (10 mg total) by mouth every 6 (six) hours as needed (Nausea or vomiting). 30 tablet 1   traMADol (ULTRAM) 50 MG tablet Take 1 tablet (50 mg total) by  mouth every 6 (six) hours as needed for severe pain. 30 tablet 0    Results for orders placed or performed during the hospital encounter of 11/09/20 (from the past 48 hour(s))  Pregnancy, urine POC     Status: None   Collection Time: 11/09/20  6:15 AM  Result Value Ref Range   Preg Test, Ur NEGATIVE NEGATIVE    Comment:        THE SENSITIVITY OF THIS METHODOLOGY IS >24 mIU/mL    No results found.  Review of Systems negative Temperature (!) 97.5 F (36.4 C), temperature source Oral, height 5' 4"  (1.626 m), weight 67.9 kg. Physical Exam  Breast Nipples - No Discharge. Breast Lump - No Palpable Breast Mass. Note:  breast has dense tissue but no real discrete mass now Lymphatic Head & Neck General Head & Neck Lymphatics: Bilateral - Description - Normal. Axillary  - ? enlarged right axillary node. Note:  no  adenopathy Cv rrr Pulm effort normal  Assessment/Plan BREAST CANCER METASTASIZED TO AXILLARY LYMPH NODE, RIGHT (C50.911) Right mastectomy, right ALND, left mastectomy genetics were negative but for symmetry and mostly due to young age she desires risk reducing left mastectomy. I think at her age this is reasonable and discussed nsm on left without node evaluation. discussed skin loss. nac loss and loss of sensation. On the right side after mri I do think she is nsm candidate.  I do not think she ever had inflammatory cancer.  After mri she desires reconstruction and reasonable. we discussed pros and cons of recon and outcomes with radiotherapy postoperatively. I can still palpate a node on the right which is concerning. Will plan for low level alnd given young age.    [name], MD 11/09/2020, 6:49 AM

## 2020-11-09 NOTE — Anesthesia Preprocedure Evaluation (Signed)
Anesthesia Evaluation  Patient identified by MRN, date of birth, ID band Patient awake    Reviewed: Allergy & Precautions, H&P , NPO status , Patient's Chart, lab work & pertinent test results  Airway Mallampati: I  TM Distance: >3 FB Neck ROM: Full    Dental no notable dental hx. (+) Teeth Intact   Pulmonary neg pulmonary ROS, former smoker,    Pulmonary exam normal breath sounds clear to auscultation       Cardiovascular negative cardio ROS Normal cardiovascular exam Rhythm:Regular Rate:Normal     Neuro/Psych negative neurological ROS  negative psych ROS   GI/Hepatic Neg liver ROS,   Endo/Other  negative endocrine ROS  Renal/GU negative Renal ROS  negative genitourinary   Musculoskeletal negative musculoskeletal ROS (+)   Abdominal Normal abdominal exam  (+)   Peds  Hematology  (+) Blood dyscrasia, anemia ,   Anesthesia Other Findings Breast Cancer  Reproductive/Obstetrics negative OB ROS                             Anesthesia Physical  Anesthesia Plan  ASA: 3  Anesthesia Plan: General   Post-op Pain Management:  Regional for Post-op pain   Induction: Intravenous  PONV Risk Score and Plan: 3 and Ondansetron, Dexamethasone, Midazolam and Treatment may vary due to age or medical condition  Airway Management Planned: Oral ETT  Additional Equipment: None  Intra-op Plan:   Post-operative Plan: Extubation in OR  Informed Consent: I have reviewed the patients History and Physical, chart, labs and discussed the procedure including the risks, benefits and alternatives for the proposed anesthesia with the patient or authorized representative who has indicated his/her understanding and acceptance.       Plan Discussed with: CRNA  Anesthesia Plan Comments:         Anesthesia Quick Evaluation

## 2020-11-09 NOTE — Anesthesia Procedure Notes (Addendum)
Procedure Name: Intubation Date/Time: 11/09/2020 7:38 AM Performed by: Willa Frater, CRNA Pre-anesthesia Checklist: Patient identified, Emergency Drugs available, Suction available and Patient being monitored Patient Re-evaluated:Patient Re-evaluated prior to induction Oxygen Delivery Method: Circle system utilized Preoxygenation: Pre-oxygenation with 100% oxygen Induction Type: IV induction Ventilation: Mask ventilation without difficulty Laryngoscope Size: Mac and 3 Grade View: Grade I Tube type: Oral Tube size: 7.0 mm Number of attempts: 1 Airway Equipment and Method: Stylet and Oral airway Placement Confirmation: ETT inserted through vocal cords under direct vision, positive ETCO2 and breath sounds checked- equal and bilateral Secured at: 22 cm Tube secured with: Tape Dental Injury: Teeth and Oropharynx as per pre-operative assessment

## 2020-11-09 NOTE — Progress Notes (Signed)
Assisted Dr. Sabra Heck with right, left, pectoralis block. Side rails up, monitors on throughout procedure. See vital signs in flow sheet. Tolerated Procedure well.

## 2020-11-09 NOTE — Transfer of Care (Signed)
Immediate Anesthesia Transfer of Care Note  Patient: [name]  Procedure(s) Performed: BILATERAL NIPPLE SPARING MASTECTOMY (Bilateral: Breast) RIGHT AXILLARY LYMPH NODE DISSECTION (Right: Axilla) BILATERAL BREAST RECONSTRUCTION WITH PLACEMENT OF TISSUE EXPANDER AND FLEX HD (ACELLULAR HYDRATED DERMIS) (Bilateral: Breast)  Patient Location: PACU  Anesthesia Type:General  Level of Consciousness: sedated  Airway & Oxygen Therapy: Patient Spontanous Breathing and Patient connected to face mask oxygen  Post-op Assessment: Report given to RN and Post -op Vital signs reviewed and stable  Post vital signs: Reviewed and stable  Last Vitals:  Vitals Value Taken Time  BP    Temp    Pulse    Resp    SpO2      Last Pain:  Vitals:   11/09/20 0638  TempSrc: Oral  PainSc: 0-No pain         Complications: No notable events documented.

## 2020-11-10 ENCOUNTER — Telehealth: Payer: Self-pay | Admitting: Plastic Surgery

## 2020-11-10 ENCOUNTER — Telehealth: Payer: Self-pay

## 2020-11-10 ENCOUNTER — Encounter (HOSPITAL_BASED_OUTPATIENT_CLINIC_OR_DEPARTMENT_OTHER): Payer: Self-pay | Admitting: General Surgery

## 2020-11-10 DIAGNOSIS — C50911 Malignant neoplasm of unspecified site of right female breast: Secondary | ICD-10-CM | POA: Diagnosis not present

## 2020-11-10 MED ORDER — SULFAMETHOXAZOLE-TRIMETHOPRIM 800-160 MG PO TABS: 1.0000 | ORAL_TABLET | Freq: Two times a day (BID) | ORAL | 0 refills | Status: DC

## 2020-11-10 MED ORDER — NITROGLYCERIN 2 % TD OINT: 0.5000 [in_us] | TOPICAL_OINTMENT | Freq: Two times a day (BID) | TRANSDERMAL | 3 refills | Status: DC

## 2020-11-10 MED ORDER — HYDROCODONE-ACETAMINOPHEN 5-325 MG PO TABS: 1.0000 | ORAL_TABLET | ORAL | 0 refills | Status: DC | PRN

## 2020-11-10 MED ORDER — NITROGLYCERIN 2 % TD OINT: 1.0000 [in_us] | TOPICAL_OINTMENT | Freq: Once | TRANSDERMAL | Status: DC

## 2020-11-10 NOTE — Discharge Instructions (Addendum)
Activity As tolerated: NO showers for 3 days No heavy activities  Diet: Regular  Wound Care: Keep dressing clean & dry for 3 days.  Keep wrap applied as much as possible.  Wrap should not be tight.  Empty and record drain output.  Do not change dressings for 3 days unless soiled.  Can change if needed but make sure to reapply wrap. After three days can remove wrap and shower.  Then reapply dressings if needed and continue wrap or soft sports bra. Call doctor if any unusual problems occur such as pain, excessive bleeding, unrelieved nausea/vomiting, fever &/or chills  Follow-up appointment: Scheduled for next week.  About my Jackson-Pratt Bulb Drain  What is a Jackson-Pratt bulb? A Jackson-Pratt is a soft, round device used to collect drainage. It is connected to a long, thin drainage catheter, which is held in place by one or two small stiches near your surgical incision site. When the bulb is squeezed, it forms a vacuum, forcing the drainage to empty into the bulb.  Emptying the Jackson-Pratt bulb- To empty the bulb: 1. Release the plug on the top of the bulb. 2. Pour the bulb's contents into a measuring container which your nurse will provide. 3. Record the time emptied and amount of drainage. Empty the drain(s) as often as your     doctor or nurse recommends.  Date                  Time                    Amount (Drain 1)            Amount (Drain 2)              Amount (Drain  3)  _____________________________________________________________________________________________________________  _____________________________________________________________________________________________________________  _____________________________________________________________________________________________________________  _____________________________________________________________________________________________________________  _____________________________________________________________________________________________________________  _____________________________________________________________________________________________________________  _____________________________________________________________________________________________________________  ______________________________________________________________________________________________________________  Squeezing the Jackson-Pratt Bulb- To squeeze the bulb: 1. Make sure the plug at the top of the bulb is open. 2. Squeeze the bulb tightly in your fist. You will hear air squeezing from the bulb. 3. Replace the plug while the bulb is squeezed. 4. Use a safety pin to attach the bulb to your clothing. This will keep the catheter from     pulling at the bulb insertion site.  When to call your doctor- Call your doctor if: Drain site becomes red, swollen or hot. You have a fever greater than 101 degrees F. There is oozing at the drain site. Drain falls out (apply a guaze bandage over the drain hole and secure it with tape). Drainage increases daily not related to activity patterns. (You will usually have more drainage when you are active than when you are resting.) Drainage has a bad odor.

## 2020-11-10 NOTE — Discharge Summary (Signed)
Physician Discharge Summary  Patient ID: MANROOP JAKUBOWICZ MRN: [id number] DOB/AGE: 08-18-1975 45 y.o.  Admit date: 11/09/2020 Discharge date: 11/10/2020  Admission Diagnoses:  Discharge Diagnoses:  Active Problems:   Breast cancer [facility])   Discharged Condition: good  Hospital Course: Tolerated procedure  Consults: None  Significant Diagnostic Studies: none  Treatments: surgery: tolerated well  Discharge Exam: Blood pressure 100/69, pulse 100, temperature 98.2 F (36.8 C), resp. rate 18, height 5' 4"  (1.626 m), weight 67.9 kg, SpO2 99 %. General appearance: alert  Disposition: Discharge disposition: 01-Home or Self Care       Discharge Instructions     Diet - low sodium heart healthy   Complete by: As directed    Increase activity slowly   Complete by: As directed        Signed: [name] 11/10/2020, 8:57 AM

## 2020-11-10 NOTE — Progress Notes (Signed)
Patient ID: [name], female   DOB: 21-May-1975, 45 y.o.   MRN: [id number] Doing well, drains as expected, will call later this week with pathology.

## 2020-11-10 NOTE — Telephone Encounter (Signed)
Patient called to say that she received a prescription for sulfamethoxazole-trimethoprim and would like to know if it's mandatory for her to take it.  Please call.

## 2020-11-10 NOTE — Telephone Encounter (Signed)
Patient left voicemail wanting to find out if she can mix the hydrocodone and antibiotic or if she should wait a certain amount of time in between taking the two. Please call her to advise.

## 2020-11-10 NOTE — Telephone Encounter (Signed)
Called and spoke with the patient regarding the message below.  Informed the patient that the sulfamethoxazole-trimethoprim is for precaution or preventative after surgery.  So yes it would be good for her to take it.  Patient verbalized understanding and agreed.    She stated that she's on a lot of medicine now and she just didn't want to add anymore.    Patient also asked if we could call in something for nausea.  Informed the patient that I see in her chart that Ondansetron (Zofran) was sent to her pharmacy on (06/16/2020) #30,1 refill.  Asked the patient if she still have that prescription.  She stated that she's not sure.    Informed the patient that she can call her pharmacy to see if they still have the refill on file,and if so ask if they would fill it for her to take.  Patient verbalized understanding.//AB/CMA

## 2020-11-10 NOTE — Telephone Encounter (Signed)
Called and spoke with the patient regarding the message below.  Informed the patient she should be fine, but if she really have concerns she can call the pharmacy and they can let her know.  Patient verbalized understanding and agreed.//AB/CMA

## 2020-11-15 ENCOUNTER — Encounter: Payer: Self-pay | Admitting: *Deleted

## 2020-11-17 ENCOUNTER — Encounter: Payer: Self-pay | Admitting: *Deleted

## 2020-11-17 ENCOUNTER — Encounter: Payer: Self-pay | Admitting: Hematology and Oncology

## 2020-11-18 ENCOUNTER — Ambulatory Visit: Payer: Managed Care, Other (non HMO) | Admitting: Hematology and Oncology

## 2020-11-18 ENCOUNTER — Other Ambulatory Visit: Payer: Managed Care, Other (non HMO)

## 2020-11-18 ENCOUNTER — Ambulatory Visit: Payer: Managed Care, Other (non HMO)

## 2020-11-18 NOTE — Progress Notes (Signed)
Patient Care Team: Patient, [name] (Inactive) as PCP - General (General Practice) [name], RN as Oncology Nurse Navigator [name], RN as Oncology Nurse Navigator  DIAGNOSIS:    ICD-10-CM   1. 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 palpable right breast mass measuring about 7 cm x 5 and half centimeters, palpable right axillary lymph node with concern for skin invasion    06/08/2020 Cancer Staging   Staging form: Breast, AJCC 8th Edition - Clinical: Stage IIIB (cT4d, cN1, cM0, G3, ER+, PR-, HER2+) - Signed by [name], MD on 07/07/2020 Histologic grading system: 3 grade system   06/24/2020 Genetic Testing   No pathogenic variants detected in [facility] [employer] [facility]RNA[employer].  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  The report date is June 24, 2020.   The CustomNext-Cancer+RNAinsight panel offered by [facility] includes sequencing and rearrangement analysis for the following 47 genes:  APC, ATM, AXIN2, BARD1, BMPR1A, BRCA1, BRCA2, BRIP1, CDH1, CDK4, CDKN2A, CHEK2, DICER1, EPCAM, GREM1, HOXB13, MEN1, MLH1, MSH2, MSH3, MSH6, MUTYH, NBN, NF1, NF2, NTHL1, PALB2, PMS2, POLD1, POLE, PTEN, RAD51C, RAD51D, RECQL, RET, SDHA, SDHAF2, SDHB, SDHC, SDHD, SMAD4, SMARCA4, STK11, TP53, TSC1, TSC2, and VHL.  RNA data is routinely  analyzed for use in variant interpretation for all genes.   06/25/2020 - 10/29/2020 Chemotherapy          11/09/2020 Surgery   Bilateral mastectomies: Left mastectomy: Benign Right mastectomy: Residual microinvasive cancer status post neoadjuvant therapy, DCIS, 0/12 lymph nodes, ER +1%, PR negative, HER2 equivocal 2+ by [facility]   11/20/2020 -  Chemotherapy    Patient is on Treatment Plan: BREAST TRASTUZUMAB  + PERTUZUMAB Q21D X 13 CYCLES         CHIEF COMPLIANT:  Herceptin and Perjeta  INTERVAL HISTORY: [name] is a 45 y.o. with above-mentioned history of HER-2 positive right breast cancer currently on neoadjuvant chemotherapy with TCH Perjeta, but Perjeta and Taxotere have been discontinued due to gastrointestinal side effects. She presents to the clinic today for a toxicity check and treatment.  She reports that since her surgery the pain is under reasonable control.  She wants to get the drains out today.  Does not have any major residual adverse effects from prior chemo.  She is here to receive Herceptin and Perjeta.  ALLERGIES:  is allergic to other.  MEDICATIONS:  Current Outpatient Medications  Medication Sig Dispense Refill   diphenoxylate-atropine (LOMOTIL) 2.5-0.025 MG tablet 1 to 2 tablets before meals and also use at bedtime if needed 90 tablet 1   ferrous sulfate 325 (65 FE) MG tablet Take 325 mg by mouth daily with breakfast.     HYDROcodone-acetaminophen (NORCO/VICODIN) 5-325 MG tablet Take 1 tablet by mouth every 4 (four) hours as needed for moderate pain. 30 tablet 0   lidocaine-prilocaine (EMLA) cream Apply 1 application  topically as needed (Prior to port access). 30 g 2   loratadine (CLARITIN) 10 MG tablet Take 10 mg by mouth See admin instructions. Take 7 days starting the day of injection     mesalamine (LIALDA) 1.2 g EC tablet Take 2 tablets (2.4 g total) by mouth 2 (two) times daily with a meal. 120 tablet 11   Multiple Vitamin (MULTIVITAMIN) tablet Take 1  tablet by mouth daily.     nitroGLYCERIN (NITROGLYN) 2 % ointment Apply 0.5 inches topically 2 (two) times daily. 30 g 3   sulfamethoxazole-trimethoprim (BACTRIM DS) 800-160 MG tablet Take 1 tablet by mouth 2 (two) times daily. 28 tablet 0   traMADol (ULTRAM) 50 MG tablet Take 1 tablet (50 mg total) by mouth every 6 (six) hours as needed for severe pain. 30 tablet 0   No current facility-administered medications for this visit.    PHYSICAL EXAMINATION: ECOG PERFORMANCE STATUS: 1 - Symptomatic but completely ambulatory  Vitals:   11/19/20 0908  BP: 136/81  Pulse: 100  Resp: 18  Temp: 97.6 F (36.4 C)  SpO2: 100%   Filed Weights   11/19/20 0908  Weight: 149 lb 9.6 oz (67.9 kg)      LABORATORY DATA:  I have reviewed the data as listed CMP Latest Ref Rng & Units 11/19/2020 10/07/2020 09/16/2020  Glucose 70 - 99 mg/dL 79 107(H) 185(H)  BUN 6 - 20 mg/dL _0 Creatinine 0.44 - 1.00 mg/dL 0.94 0.73 0.79  Sodium 135 - 145 mmol/L 138 141 141  Potassium 3.5 - 5.1 mmol/L 4.0 3.8 3.6  Chloride 98 - 111 mmol/L 106 104 103  CO2 22 - 32 mmol/L 21(L) 25 24  Calcium 8.9 - 10.3 mg/dL 9.1 9.9 10.0  Total Protein 6.5 - 8.1 g/dL 6.7 7.7 7.8  Total Bilirubin 0.3 - 1.2 mg/dL <0.2(L) 0.4 0.3  Alkaline Phos 38 - 126 U/L 93 109 101  AST 15 - 41 U/L 24 15 14(L)  ALT 0 - 44 U/L _1 Lab Results  Component Value Date   WBC 7.6 11/19/2020   HGB 7.4 (L) 11/19/2020   HCT 23.8 (L) 11/19/2020   MCV 88.5 11/19/2020   PLT 341 11/19/2020   NEUTROABS 5.2 11/19/2020    ASSESSMENT & PLAN:  Malignant neoplasm of upper-outer quadrant of right breast in female, estrogen receptor positive ([place]) 06/03/2020, US guided biopsy of the right breast 9 0 clock mass showed grade III IDC; Prognostics showed ER 40% positive, weak staining, PR positive 0 %, Her 2 3 +, Ki 67 30% 4dCN1M0 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 palpable right breast mass measuring about 7 cm x 5 and half centimeters,  palpable right axillary lymph node with concern for skin invasion    Treatment Plan: 1. Neoadjuvant chemo with TCHP q 3 weeks X 6 followed by HP 2. Mastectomy with TAD 3. Adj XRT 4. Adj Anti-estrogen therapy 5. Neratinib ---------------------------------------------------------------------------------------------------------   Bilateral mastectomies: Left mastectomy: Benign Right mastectomy: Residual microinvasive cancer status post neoadjuvant therapy, DCIS, 0/12 lymph nodes, ER +1%, PR negative, HER2 equivocal 2+ by [facility]  Pathology counseling: I discussed the final pathology report of the patient provided  a copy of this report. I discussed the margins as well as lymph node surgeries. We also discussed the final staging along with previously performed ER/PR and HER-2/neu testing.  Treatment plan:  Herceptin Perjeta maintenance Adjuvant radiation  Return to clinic every 3 weeks for Herceptin Perjeta and  every 6 weeks for labs Herceptin Perjeta and MD follow-up.    Orders Placed This Encounter  Procedures   ONCBCN PHYSICIAN COMMUNICATION 1    Trastuzumab/Pertuzumab SQ options for each treatment day available via "Add Orders".   PHYSICIAN COMMUNICATION ORDER    A baseline Echo/Muga should be obtained prior to initiation of Trastuzumab, at 3, 6, 9 months during Trastuzumab treatment.   PHYSICIAN COMMUNICATION ORDER    Post Neo-adjuvant AC-THP. Repeat trastuzumab 8 mg/kg Loading Dose D1 if dose missed by > 1 week. Repeat pertuzumab (Perjeta) 840 mg Loading Dose D1 if 6 weeks or > since last dose   The patient has a good understanding of the overall plan. she agrees with it. she will call with any problems that may develop before the next visit here.  Total time spent: 30 mins including face to face time and time spent for planning, charting and coordination of care  Rulon Eisenmenger, MD, MPH 11/19/2020  I, Thana Ates, am acting as scribe for Dr. [name].  I have reviewed  the above documentation for accuracy and completeness, and I agree with the above.

## 2020-11-19 ENCOUNTER — Encounter: Payer: Self-pay | Admitting: Surgical

## 2020-11-19 ENCOUNTER — Other Ambulatory Visit: Payer: Self-pay

## 2020-11-19 ENCOUNTER — Ambulatory Visit (INDEPENDENT_AMBULATORY_CARE_PROVIDER_SITE_OTHER): Payer: Managed Care, Other (non HMO) | Admitting: Surgical

## 2020-11-19 ENCOUNTER — Inpatient Hospital Stay: Payer: Managed Care, Other (non HMO) | Attending: Hematology and Oncology

## 2020-11-19 ENCOUNTER — Telehealth: Payer: Self-pay | Admitting: Radiation Oncology

## 2020-11-19 ENCOUNTER — Inpatient Hospital Stay (HOSPITAL_BASED_OUTPATIENT_CLINIC_OR_DEPARTMENT_OTHER): Payer: Managed Care, Other (non HMO) | Admitting: Hematology and Oncology

## 2020-11-19 ENCOUNTER — Other Ambulatory Visit: Payer: Self-pay | Admitting: *Deleted

## 2020-11-19 ENCOUNTER — Inpatient Hospital Stay: Payer: Managed Care, Other (non HMO)

## 2020-11-19 VITALS — BP 136/81 | HR 100 | Temp 97.6°F | Resp 18 | Ht 64.0 in | Wt 149.6 lb

## 2020-11-19 DIAGNOSIS — C50411 Malignant neoplasm of upper-outer quadrant of right female breast: Secondary | ICD-10-CM

## 2020-11-19 DIAGNOSIS — Z170 Estrogen receptor positive status [ER+]: Secondary | ICD-10-CM | POA: Insufficient documentation

## 2020-11-19 DIAGNOSIS — Z5112 Encounter for antineoplastic immunotherapy: Secondary | ICD-10-CM | POA: Insufficient documentation

## 2020-11-19 DIAGNOSIS — Z95828 Presence of other vascular implants and grafts: Secondary | ICD-10-CM

## 2020-11-19 LAB — CBC WITH DIFFERENTIAL (CANCER CENTER ONLY)
Abs Immature Granulocytes: 0.07 10*3/uL (ref 0.00–0.07)
Basophils Absolute: 0 10*3/uL (ref 0.0–0.1)
Basophils Relative: 0 %
Eosinophils Absolute: 0 10*3/uL (ref 0.0–0.5)
Eosinophils Relative: 1 %
HCT: 23.8 % — ABNORMAL LOW (ref 36.0–46.0)
Hemoglobin: 7.4 g/dL — ABNORMAL LOW (ref 12.0–15.0)
Immature Granulocytes: 1 %
Lymphocytes Relative: 22 %
Lymphs Abs: 1.7 10*3/uL (ref 0.7–4.0)
MCH: 27.5 pg (ref 26.0–34.0)
MCHC: 31.1 g/dL (ref 30.0–36.0)
MCV: 88.5 fL (ref 80.0–100.0)
Monocytes Absolute: 0.5 10*3/uL (ref 0.1–1.0)
Monocytes Relative: 7 %
Neutro Abs: 5.2 10*3/uL (ref 1.7–7.7)
Neutrophils Relative %: 69 %
Platelet Count: 341 10*3/uL (ref 150–400)
RBC: 2.69 MIL/uL — ABNORMAL LOW (ref 3.87–5.11)
RDW: 17.2 % — ABNORMAL HIGH (ref 11.5–15.5)
WBC Count: 7.6 10*3/uL (ref 4.0–10.5)
nRBC: 0 % (ref 0.0–0.2)

## 2020-11-19 LAB — CMP (CANCER CENTER ONLY)
ALT: 24 U/L (ref 0–44)
AST: 24 U/L (ref 15–41)
Albumin: 3 g/dL — ABNORMAL LOW (ref 3.5–5.0)
Alkaline Phosphatase: 93 U/L (ref 38–126)
Anion gap: 11 (ref 5–15)
BUN: 11 mg/dL (ref 6–20)
CO2: 21 mmol/L — ABNORMAL LOW (ref 22–32)
Calcium: 9.1 mg/dL (ref 8.9–10.3)
Chloride: 106 mmol/L (ref 98–111)
Creatinine: 0.94 mg/dL (ref 0.44–1.00)
GFR, Estimated: >60 mL/min (ref >60)
Glucose, Bld: 79 mg/dL (ref 70–99)
Potassium: 4 mmol/L (ref 3.5–5.1)
Sodium: 138 mmol/L (ref 135–145)
Total Bilirubin: <0.2 mg/dL — ABNORMAL LOW (ref 0.3–1.2)
Total Protein: 6.7 g/dL (ref 6.5–8.1)

## 2020-11-19 LAB — MAGNESIUM: Magnesium: 1.7 mg/dL (ref 1.7–2.4)

## 2020-11-19 MED ORDER — SODIUM CHLORIDE 0.9 % IV SOLN
Freq: Once | INTRAVENOUS | Status: AC
  Filled 2020-11-19: qty 250

## 2020-11-19 MED ORDER — TRASTUZUMAB-DKST CHEMO 150 MG IV SOLR: 6.0000 mg/kg | Freq: Once | INTRAVENOUS | Status: DC

## 2020-11-19 MED ORDER — HEPARIN SOD (PORK) LOCK FLUSH 100 UNIT/ML IV SOLN
500.0000 [IU] | Freq: Once | INTRAVENOUS | Status: AC | PRN
  Administered 2020-11-19: 500 [IU]
  Filled 2020-11-19: qty 5

## 2020-11-19 MED ORDER — DIPHENHYDRAMINE HCL 25 MG PO CAPS
25.0000 mg | ORAL_CAPSULE | Freq: Once | ORAL | Status: AC
  Administered 2020-11-19: 25 mg via ORAL
  Filled 2020-11-19: qty 1

## 2020-11-19 MED ORDER — SODIUM CHLORIDE 0.9% FLUSH
10.0000 mL | Freq: Once | INTRAVENOUS | Status: AC
  Administered 2020-11-19: 10 mL
  Filled 2020-11-19: qty 10

## 2020-11-19 MED ORDER — SODIUM CHLORIDE 0.9 % IV SOLN
420.0000 mg | Freq: Once | INTRAVENOUS | Status: AC
  Administered 2020-11-19: 420 mg via INTRAVENOUS
  Filled 2020-11-19: qty 14

## 2020-11-19 MED ORDER — ACETAMINOPHEN 325 MG PO TABS
650.0000 mg | ORAL_TABLET | Freq: Once | ORAL | Status: AC
  Administered 2020-11-19: 650 mg via ORAL
  Filled 2020-11-19: qty 2

## 2020-11-19 MED ORDER — SODIUM CHLORIDE 0.9% FLUSH
10.0000 mL | INTRAVENOUS | Status: DC | PRN
  Administered 2020-11-19: 10 mL
  Filled 2020-11-19: qty 10

## 2020-11-19 MED ORDER — TRASTUZUMAB-ANNS CHEMO 420 MG IV SOLR
420.0000 mg | Freq: Once | INTRAVENOUS | Status: AC
  Administered 2020-11-19: 420 mg via INTRAVENOUS
  Filled 2020-11-19: qty 20

## 2020-11-19 NOTE — Progress Notes (Signed)
The following biosimilar Kanjinti (trastuzumab-anns) has been selected for use in this patient.  Kennith Center, Pharm.D., CPP 11/19/2020@9 :58 AM

## 2020-11-19 NOTE — Patient Instructions (Signed)
[facility]   Discharge Instructions: Thank you for choosing [facility] to provide your oncology and hematology care.   If you have a lab appointment with the [facility], please go directly to the [facility] and check in at the registration area.   Wear comfortable clothing and clothing appropriate for easy access to any Portacath or PICC line.   We strive to give you quality time with your provider. You may need to reschedule your appointment if you arrive late (15 or more minutes).  Arriving late affects you and other patients whose appointments are after yours.  Also, if you miss three or more appointments without notifying the office, you may be dismissed from the clinic at the provider's discretion.      For prescription refill requests, have your pharmacy contact our office and allow 72 hours for refills to be completed.    Today you received the following chemotherapy and/or immunotherapy agents: Trastuzumab (Herceptin) and Pertuzumab (Perjeta)      To help prevent nausea and vomiting after your treatment, we encourage you to take your nausea medication as directed.  BELOW ARE SYMPTOMS THAT SHOULD BE REPORTED IMMEDIATELY: *FEVER GREATER THAN 100.4 F (38 C) OR HIGHER *CHILLS OR SWEATING *NAUSEA AND VOMITING THAT IS NOT CONTROLLED WITH YOUR NAUSEA MEDICATION *UNUSUAL SHORTNESS OF BREATH *UNUSUAL BRUISING OR BLEEDING *URINARY PROBLEMS (pain or burning when urinating, or frequent urination) *BOWEL PROBLEMS (unusual diarrhea, constipation, pain near the anus) TENDERNESS IN MOUTH AND THROAT WITH OR WITHOUT PRESENCE OF ULCERS (sore throat, sores in mouth, or a toothache) UNUSUAL RASH, SWELLING OR PAIN  UNUSUAL VAGINAL DISCHARGE OR ITCHING   Items with * indicate a potential emergency and should be followed up as soon as possible or go to the Emergency Department if any problems should occur.  Please show the CHEMOTHERAPY ALERT CARD or  IMMUNOTHERAPY ALERT CARD at check-in to the Emergency Department and triage nurse.  Should you have questions after your visit or need to cancel or reschedule your appointment, please contact [facility]  Dept: [phone]  and follow the prompts.  Office hours are 8:00 a.m. to 4:30 p.m. Monday - Friday. Please note that voicemails left after 4:00 p.m. may not be returned until the following business day.  We are closed weekends and major holidays. You have access to a nurse at all times for urgent questions. Please call the main number to the clinic Dept: [phone] and follow the prompts.   For any non-urgent questions, you may also contact your provider using MyChart. We now offer e-Visits for anyone 76 and older to request care online for non-urgent symptoms. For details visit [email or web address].   Also download the MyChart app! Go to the app store, search "MyChart", open the app, select [facility], and log in with your MyChart username and password.  Due to Covid, a mask is required upon entering the hospital/clinic. If you do not have a mask, one will be given to you upon arrival. For doctor visits, patients may have 1 support person aged 56 or older with them. For treatment visits, patients cannot have anyone with them due to current Covid guidelines and our immunocompromised population.

## 2020-11-19 NOTE — Assessment & Plan Note (Signed)
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 palpable right breast mass measuring about 7 cm x 5 and half centimeters, palpable right axillary lymph node with concern for skin invasion  Treatment Plan: 1. Neoadjuvant chemo with TCHP q 3 weeks X 6 followed by HP 2. Mastectomy with TAD 3. Adj XRT 4. Adj Anti-estrogen therapy 5. Neratinib ---------------------------------------------------------------------------------------------------------   Bilateral mastectomies: Left mastectomy: Benign Right mastectomy: Residual microinvasive cancer status post neoadjuvant therapy, DCIS, 0/12 lymph nodes, ER +1%, PR negative, HER2 equivocal 2+ by IHC  Pathology counseling: I discussed the final pathology report of the patient provided  a copy of this report. I discussed the margins as well as lymph node surgeries. We also discussed the final staging along with previously performed ER/PR and HER-2/neu testing.  Treatment plan:  Herceptin Perjeta maintenance Adjuvant radiation  Return to clinic every 3 weeks for Herceptin Perjeta and every 6 weeks for labs Herceptin Perjeta and MD follow-up. 

## 2020-11-19 NOTE — Progress Notes (Signed)
Patient is a 45 year old female here for follow-up after bilateral nipple sparing mastectomy by [name] followed by bilateral breast reconstruction with placement of tissue expanders with Dr. Claudia Desanctis on 11/09/2020.  She is 10 days postop.  Patient reports overall she is doing well, reports no infectious symptoms.  She reports that the drains have been uncomfortable.  Chaperone present on exam Bilateral breast incisions intact, Steri-Strips in place.  Right JP drains in place with serosanguineous output, left JP drain in place with serosanguineous output.  Output from left JP drain approximately 30 cc per 24 hours, output from right breast JP drain approximately 30 cc per 24 hours.  Output from right axilla JP drain approximately 25 cc per 24 hours.  There is no erythema or cellulitic changes.  Patient had Nitropaste over right NAC complex.  No NAC complex necrosis noted, some slight pink discoloration of the nipple but no sloughing skin noted today on exam or necrosis.  Given the appearance of the right nipple and the use of Nitropaste intraoperatively decided to hold off on fill until next week.  We did remove the right axillary JP drain today due to low output.  We will plan to remove bilateral breast JP drains next week.  Recommend calling with questions or concerns.  There is no sign of infection or seroma or hematoma.  We did not place injectable saline in the Expander using a sterile technique: Right: 0 cc for a total of 150 / 375 cc Left: 0 cc for a total of 150 / 375 cc

## 2020-11-22 ENCOUNTER — Encounter: Payer: Self-pay | Admitting: *Deleted

## 2020-11-22 LAB — SURGICAL PATHOLOGY

## 2020-11-23 NOTE — Progress Notes (Signed)
New Breast Cancer Diagnosis: Right Breast UOQ  Did patient present with symptoms (if so, please note symptoms) or screening mammography?:Palpable mass    Location and Extent of disease :right breast. Located at 9 o'clock position, measured  7 x 5.5 cm in greatest dimension. Adenopathy yes.  Histology per Pathology Report: grade 3, Invasive Ductal Carcinoma 11/09/2020  Receptor Status: ER(positive), PR (negative), Her2-neu (positive), Ki-(30%)  Surgeon and surgical plan, if any:  [name] -Bilateral Mastectomies 11/09/2020 -Left Mastectomy: benign -Right Mastectomy: Residual microinvasion cancer post neoadjuvant therapy, DCIS, 0/12 lymph nodes, ER +1%, PR negative, HER2 equivocal 2+ by IHC  -Follow-up last week with [name] -Drains come out today.  Medical oncologist, treatment if any:   Dr. Lindi Adie 11/19/2020 -Treatment Plan: 1. Neoadjuvant chemo with TCHP q 3 weeks X 6 followed by HP 2. Mastectomy with TAD 3. Adj XRT 4. Adj Anti-estrogen therapy 5. Neratinib Treatment Plan:  Herceptin Perjeta maintenance Adjuvant radiation   Plastic Surgeon Dr. Claudia Desanctis- Breast reconstruction with placement of tissue expander.  Family History of Breast/Ovarian/Prostate Cancer: Maternal Aunt had breast cancer, Maternal Cousin had breast cancer.  Lymphedema issues, if any: Little bit, seeing PT next week.     Pain issues, if any: None     SAFETY ISSUES: Prior radiation? No Pacemaker/ICD? No Possible current pregnancy? Chemotherapy, Has occasional spotting. Is the patient on methotrexate? No  Current Complaints / other details:   Port insertion 06/23/2020

## 2020-11-25 ENCOUNTER — Ambulatory Visit
Admission: RE | Admit: 2020-11-25 | Discharge: 2020-11-25 | Disposition: A | Discharge status: Home / Self Care | Payer: Managed Care, Other (non HMO) | Source: Ambulatory Visit | Attending: Radiation Oncology | Admitting: Radiation Oncology

## 2020-11-25 ENCOUNTER — Encounter: Payer: Self-pay | Admitting: Radiation Oncology

## 2020-11-25 ENCOUNTER — Ambulatory Visit (INDEPENDENT_AMBULATORY_CARE_PROVIDER_SITE_OTHER): Payer: Managed Care, Other (non HMO) | Admitting: Plastic Surgery

## 2020-11-25 ENCOUNTER — Other Ambulatory Visit: Payer: Self-pay

## 2020-11-25 VITALS — Ht 64.0 in | Wt 150.0 lb

## 2020-11-25 DIAGNOSIS — Z170 Estrogen receptor positive status [ER+]: Secondary | ICD-10-CM

## 2020-11-25 DIAGNOSIS — C50411 Malignant neoplasm of upper-outer quadrant of right female breast: Secondary | ICD-10-CM

## 2020-11-25 NOTE — Progress Notes (Signed)
Radiation Oncology         [phone] ________________________________  Initial Outpatient Consultation - Conducted via telephone due to current COVID-19 concerns for limiting patient exposure  I spoke with the patient to conduct this consult visit via telephone to spare the patient unnecessary potential exposure in the healthcare setting during the current COVID-19 pandemic. The patient was notified in advance and was offered a [place] meeting to allow for face to face communication but unfortunately reported that they did not have the appropriate resources/technology to support such a visit and instead preferred to proceed with a telephone consult.    Name: [name]        MRN: [id number]  Date of Service: 11/25/2020 DOB: 04/03/1976  OJ:[id number], [name] (Inactive)  [name], MD     REFERRING PHYSICIAN: [name], MD   DIAGNOSIS: The encounter diagnosis was Malignant neoplasm of upper-outer quadrant of right breast in female, estrogen receptor positive ([place]).   HISTORY OF PRESENT ILLNESS: [name] is a 45 y.o. female with a diagnosis of right breast cancer.  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  During her work-up she was noted to have multiple lesions in the breast, between 8 to 10 o'clock position there were multiple small masses, at 9:00 in the right breast there was a 2.1 cm mass and at 10:00 a 1.9 cm mass with 1 axillary lymph node of concern.  She underwent biopsies on 06/03/2020 which revealed grade 3 invasive ductal carcinoma in the 9:00 to 10:00 and right axillary specimens.  Her cancer was ER positive PR negative HER2 was amplified with a Ki-67 of 30%.  She subsequently was counseled on the rationale for neoadjuvant chemotherapy and MRI prior to treatment revealed a conglomerate mass in the  right breast measuring a total of 7.9 cm, there was right nipple and skin enhancement and 1 abnormal appearing lymph node in the left axilla.  She also had a 6 mm mass in the left breast that was identified, it was sampled on 06/14/2020 and was consistent with benign focal fibroadenomatous change, she went on to receive chemotherapy between 06/25/2020 and 10/07/2020.  Her post neoadjuvant MRI scan showed significant improvement in the tumor now measuring 2 cm in the right breast, her lymph node showed normal-appearing morphology.  She elected to proceed with right mastectomy bilateral nipple sparing mastectomy and right axillary lymph node dissection with immediate placement of tissue expanders on 11/09/20.  Final pathology revealed microinvasive grade 1 invasive ductal carcinoma measuring 1 mm her lymph nodes that were sampled were negative for disease and 12 sampled nodes were removed.  Her margins were negative for invasive disease and 2 mm from the anterior and posterior for noninvasive disease.  Her specimen was retested for prognostic markers and remained weakly positive for estrogen, PR negative, and HER2 positive.  She is contacted today by phone to discuss adjuvant radiotherapy.  She also continues with systemic Perjeta and Kanjinti.   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 10/17/2010   Rectal bleeding  Uterine fibroid    Vitamin D deficiency 09/24/2013       PAST SURGICAL HISTORY: Past Surgical History:  Procedure Laterality Date   AXILLARY LYMPH NODE DISSECTION Right 11/09/2020   Procedure: RIGHT AXILLARY LYMPH NODE DISSECTION;  Surgeon: [name], MD;  Location: [facility];  Service: General;  Laterality: Right;    BIOPSY  07/01/2020   Procedure: BIOPSY;  Surgeon: Yetta Flock, MD;  Location: [facility];  Service: Gastroenterology;;   BREAST RECONSTRUCTION WITH PLACEMENT OF TISSUE EXPANDER AND FLEX HD (ACELLULAR HYDRATED DERMIS) Bilateral 11/09/2020   Procedure: BILATERAL BREAST RECONSTRUCTION WITH PLACEMENT OF TISSUE EXPANDER AND FLEX HD (ACELLULAR HYDRATED DERMIS);  Surgeon: [name], MD;  Location: [facility];  Service: Plastics;  Laterality: Bilateral;   COLECTOMY  06/27/2019   COLONOSCOPY  2015   FLEXIBLE SIGMOIDOSCOPY N/A 07/01/2020   Procedure: FLEXIBLE SIGMOIDOSCOPY;  Surgeon: Yetta Flock, MD;  [facility];  Service: Gastroenterology;  Laterality: N/A;   NIPPLE SPARING MASTECTOMY Bilateral 11/09/2020   Procedure: BILATERAL NIPPLE SPARING MASTECTOMY;  Surgeon: [name], MD;  Location: [facility];  Service: General;  Laterality: Bilateral;   PORTACATH PLACEMENT N/A 06/23/2020   Procedure: INSERTION PORT-A-CATH;  Surgeon: [name], MD;  Location: [facility];  Service: General;  Laterality: N/A;  START TIME OF 11:00 AM FOR 60 MINUTES [place] [facility]     FAMILY HISTORY:  Family History  Problem Relation Age of Onset   Breast cancer Maternal Aunt        dx unknown age   Diabetes Father    Diabetes Paternal Aunt    Breast cancer Cousin        maternal cousin; dx unknown age   Esophageal cancer Neg Hx    Rectal cancer Neg Hx    Stomach cancer Neg Hx    Colon polyps Neg Hx    Colon cancer Neg Hx      SOCIAL HISTORY:  reports that she has quit smoking. Her smoking use included cigarettes. She has never used smokeless tobacco. She reports current alcohol use of about 1.0 - 2.0 standard drink per week. She reports that she does not use drugs. The patient is in a relationship and lives in [place].    ALLERGIES: Other   MEDICATIONS:  Current Outpatient Medications  Medication Sig Dispense Refill    ferrous sulfate 325 (65 FE) MG tablet Take 325 mg by mouth daily with breakfast.     HYDROcodone-acetaminophen (NORCO/VICODIN) 5-325 MG tablet Take 1 tablet by mouth every 4 (four) hours as needed for moderate pain. 30 tablet 0   lidocaine-prilocaine (EMLA) cream Apply 1 application topically as needed (Prior to port access). 30 g 2   mesalamine (LIALDA) 1.2 g EC tablet Take 2 tablets (2.4 g total) by mouth 2 (two) times daily with a meal. 120 tablet 11   Multiple Vitamin (MULTIVITAMIN) tablet Take 1 tablet by mouth daily.     nitroGLYCERIN (NITROGLYN) 2 % ointment Apply 0.5 inches topically 2 (two) times daily. 30 g 3   sulfamethoxazole-trimethoprim (BACTRIM DS) 800-160 MG tablet Take 1 tablet by mouth 2 (two) times daily. 28 tablet 0   No current facility-administered medications for this encounter.     REVIEW OF SYSTEMS: On review of systems, the patient reports that she is doing well and has a little swelling in her arm as well as some restricted ROM. She's going to see Dr. Claudia Desanctis this afternoon. She reports she  did pretty well with chemotherapy. No other complaints are noted.     PHYSICAL EXAM:  Unable to assess due to encounter     ECOG = 1  0 - Asymptomatic (Fully active, able to carry on all predisease activities without restriction)  1 - Symptomatic but completely ambulatory (Restricted in physically strenuous activity but ambulatory and able to carry out work of a light or sedentary nature. For example, light housework, office work)  2 - Symptomatic, <50% in bed during the day (Ambulatory and capable of all self care but unable to carry out any work activities. Up and about more than 50% of waking hours)  3 - Symptomatic, >50% in bed, but not bedbound (Capable of only limited self-care, confined to bed or chair 50% or more of waking hours)  4 - Bedbound (Completely disabled. Cannot carry on any self-care. Totally confined to bed or chair)  5 - Death   Eustace Pen MM, Creech RH,  Tormey DC, et al. [phone]). "Toxicity and response criteria of the [facility]". [place] Oncol. 5 (6): 649-55    LABORATORY DATA:  Lab Results  Component Value Date   WBC 7.6 11/19/2020   HGB 7.4 (L) 11/19/2020   HCT 23.8 (L) 11/19/2020   MCV 88.5 11/19/2020   PLT 341 11/19/2020   Lab Results  Component Value Date   NA 138 11/19/2020   K 4.0 11/19/2020   CL 106 11/19/2020   CO2 21 (L) 11/19/2020   Lab Results  Component Value Date   ALT 24 11/19/2020   AST 24 11/19/2020   ALKPHOS 93 11/19/2020   BILITOT <0.2 (L) 11/19/2020      RADIOGRAPHY: No results found.     IMPRESSION/PLAN: 1. 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 Dr. Lisbeth Renshaw discusses the patient's course of treatment including final pathology findings and reviews the nature of node positive breast disease. The consensus from the breast conference includes continuation of targeted HER2 therapy and external radiotherapy to the right chest wall and regional nodes to reduce risks of local recurrence followed by antiestrogen therapy. We discussed the risks, benefits, short, and long term effects of radiotherapy, as well as the curative intent, and the patient is interested in proceeding. Dr. Lisbeth Renshaw recommends a course of 6 1/2 weeks of radiotherapy. We will see for simulation on 12/06/20 and would proceed on 12/20/20 if healed and if her reconstructive plan is to proceed with delayed implant exchange, we can delay radiation depending on if she decides for implant exchange sooner. 2. Contraceptive counseling. The patient is not on any contraceptive prophylaxis. We discussed the importance of not getting pregnant during radiotherapy out of concerns for risks to the fetus.  She is in agreement to proceed with a home pregnancy test prior to simulation.  Given current concerns for patient exposure during the COVID-19 pandemic, this encounter was  conducted via telephone.  The patient has provided two factor identification and has given verbal consent for this type of encounter and has been advised to only accept a meeting of this type in a secure network environment. The time spent during this encounter was 60 minutes including preparation, discussion, and coordination of the patient's care. The attendants for this meeting include Blenda Nicely, RN, Dr. Lisbeth Renshaw, Hayden Pedro  and [name].  During the encounter,  Blenda Nicely, RN, Dr. Lisbeth Renshaw, and Hayden Pedro were located at [facility].  [name] was located at home.   The above documentation reflects my direct findings during this shared patient visit. Please see the separate note by Dr. Lisbeth Renshaw on this date for the remainder of the patient's plan of care.    [name], [facility]    **Disclaimer: This note was dictated with voice recognition software. Similar sounding words can inadvertently be transcribed and this note may contain transcription errors which may not have been corrected upon publication of note.**

## 2020-11-25 NOTE — Progress Notes (Signed)
Patient is a 45 year old female with past medical history of right-sided breast cancer s/p chemotherapy and bilateral nipple sparing mastectomy performed by [name] followed by breast reconstruction with tissue expander placement performed by Dr. Claudia Desanctis on 11/09/2020 who presents to the clinic for follow-up.  She is now 16 days postop.  She was seen most recently here in the clinic 11/19/2020.  At that time, she noted the drains were uncomfortable, but otherwise was doing well.  The JP drains were draining approximately 30 cc per 24 hours.  Plan was to leave JP drains in an additional week as well as hold off on expander injections until subsequent appointment.  Physical exam was largely reassuring as she was without any NAC necrosis or sloughing.  JP drains were each draining approximately 30 cc per 24-hour.    On my examination, she is doing well.  Patient denies any significant pain symptoms as well as any fevers, chills, redness or swelling, or other concerning symptoms.  She no longer is requiring Nitropaste for her NSM.    Patient's JP drains were both only draining approximately 10 to 15 cc/day.  We remove them here in clinic.  She tolerated the procedure without difficulty.  Her incisions appear to be well-healing.  Steri-Strips were removed.  While she was due for a fill today, there still appears to be a small wound involving the right ANC.  Discussed with Dr. Claudia Desanctis and we will abstain from fill for another week as we do not want to put any further strain on the skin for healing.  Patient is understandable and agreeable with current plan.  She will return next week.  We placed injectable saline in the Expander using a sterile technique: Right: 0 cc for a total of 150/ 375 cc Left: 0 cc for a total of 150/ 375 cc  Patient instructed to call the clinic should she develop any new questions or concerns.

## 2020-12-01 ENCOUNTER — Encounter: Payer: Self-pay | Admitting: *Deleted

## 2020-12-02 ENCOUNTER — Ambulatory Visit (INDEPENDENT_AMBULATORY_CARE_PROVIDER_SITE_OTHER): Payer: Managed Care, Other (non HMO) | Admitting: Plastic Surgery

## 2020-12-02 ENCOUNTER — Other Ambulatory Visit: Payer: Self-pay

## 2020-12-02 DIAGNOSIS — Z170 Estrogen receptor positive status [ER+]: Secondary | ICD-10-CM

## 2020-12-02 DIAGNOSIS — C50411 Malignant neoplasm of upper-outer quadrant of right female breast: Secondary | ICD-10-CM

## 2020-12-02 NOTE — Progress Notes (Signed)
Patient presents for follow-up after breast reconstruction.  We have been waiting to fill her to let some small areas of nipple epidermal lysis healed up.  This looks to be doing better and so we will start her films today.  75 cc was infiltrated on both sides.  If everything continues to go well we will remove all the air from her expanders next week and proceed with all saline going forward.  Her current total volume is 225 cc.  This is in the 375 cc expander.

## 2020-12-06 ENCOUNTER — Ambulatory Visit: Payer: Managed Care, Other (non HMO) | Attending: General Surgery | Admitting: Physical Therapy

## 2020-12-06 ENCOUNTER — Other Ambulatory Visit: Payer: Self-pay

## 2020-12-06 ENCOUNTER — Ambulatory Visit
Admission: RE | Admit: 2020-12-06 | Discharge: 2020-12-06 | Disposition: A | Discharge status: Home / Self Care | Payer: Managed Care, Other (non HMO) | Source: Ambulatory Visit | Attending: Radiation Oncology | Admitting: Radiation Oncology

## 2020-12-06 ENCOUNTER — Encounter: Payer: Self-pay | Admitting: Physical Therapy

## 2020-12-06 DIAGNOSIS — C50411 Malignant neoplasm of upper-outer quadrant of right female breast: Secondary | ICD-10-CM | POA: Insufficient documentation

## 2020-12-06 DIAGNOSIS — M79621 Pain in right upper arm: Secondary | ICD-10-CM | POA: Diagnosis present

## 2020-12-06 DIAGNOSIS — R293 Abnormal posture: Secondary | ICD-10-CM | POA: Diagnosis present

## 2020-12-06 DIAGNOSIS — Z170 Estrogen receptor positive status [ER+]: Secondary | ICD-10-CM | POA: Insufficient documentation

## 2020-12-06 DIAGNOSIS — Z483 Aftercare following surgery for neoplasm: Secondary | ICD-10-CM | POA: Diagnosis present

## 2020-12-06 DIAGNOSIS — Z510 Encounter for antineoplastic radiation therapy: Secondary | ICD-10-CM | POA: Insufficient documentation

## 2020-12-06 NOTE — Patient Instructions (Addendum)
            [facility]         [address], [place] [address]         [phone]         Annia Friendly, PT, CLT   After Breast Cancer Class It is recommended you attend the ABC class to be educated on lymphedema risk reduction. This class is free of charge and lasts for 1 hour. It is a 1-time class.  You are scheduled for October 17th at 11:00. We will send you the link the night before the class.  Scar massage You can being doing gentle scar massage with coconut oil to all of your incisions a few minutes each day.  Home exercise Program Begin doing the nerve stretch (shown below) to reduce the upper arm tightness.  Follow up PT: It is recommended you return every 3 months for the first 3 years following surgery to be assessed on the SOZO machine for an L-Dex score. This helps prevent clinically significant lymphedema in 95% of patients. These follow up screens are 10 minute appointments that you are not billed for. WE ARE SCHEDULED TO MOVE TO [facility] January 10, 2021. APPOINTMENTS FOR SOZO SCREENS AFTER 01/10/2021 WILL BE LOCATED AT [facility] AT [address] [place] [address]. Please call us to confirm we have moved if your appointment is scheduled after October 3rd, 2022. The phone number is [phone]. You are scheduled for November 14th at 4:50.  MEDIAN NERVE: Mobilization XI    Stand with right palm flat on wall, fingers back, elbow bent, head tilted away. Sidestep away from wall, straightening elbow. Do _1__ set of __3_ repetitions per session, holding 5 seconds. Do _2-3__ sessions per day.  Copyright  VHI. All rights reserved.

## 2020-12-06 NOTE — Therapy (Signed)
[place], [place], [address] Phone: [phone]   Fax:  [phone]  Physical Therapy Treatment  Patient Details  Name: [name] MRN: [id number] Date of Birth: 12/28/1975 Referring Provider (PT): Dr. [name]   Encounter Date: 12/06/2020   PT End of Session - 12/06/20 1548     Visit Number 2    Number of Visits 2    PT Start Time 1505    PT Stop Time 2800    PT Time Calculation (min) 43 min    Activity Tolerance Patient tolerated treatment well    Behavior During Therapy [facility] for tasks assessed/performed             Past Medical History:  Diagnosis Date   Cancer ([facility])    Colon stricture ([facility]) 05/07/2019   Crohn's disease ([facility])    Family history of breast cancer 06/11/2020   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 10/17/2010   Rectal bleeding    Uterine fibroid    Vitamin D deficiency 09/24/2013    Past Surgical History:  Procedure Laterality Date   AXILLARY LYMPH NODE DISSECTION Right 11/09/2020   Procedure: RIGHT AXILLARY LYMPH NODE DISSECTION;  Surgeon: [name], MD;  Location: [place];  Service: General;  Laterality: Right;   BIOPSY  07/01/2020   Procedure: BIOPSY;  Surgeon: Yetta Flock, MD;  Location: [facility];  Service: Gastroenterology;;   BREAST RECONSTRUCTION WITH PLACEMENT OF TISSUE EXPANDER AND FLEX HD (ACELLULAR HYDRATED DERMIS) Bilateral 11/09/2020   Procedure: BILATERAL BREAST RECONSTRUCTION WITH PLACEMENT OF TISSUE EXPANDER AND FLEX HD (ACELLULAR HYDRATED DERMIS);  Surgeon: [name], MD;  Location: [place];  Service: Plastics;  Laterality: Bilateral;   COLECTOMY  06/27/2019   COLONOSCOPY  2015   FLEXIBLE SIGMOIDOSCOPY N/A 07/01/2020   Procedure: FLEXIBLE SIGMOIDOSCOPY;  Surgeon: Yetta Flock, MD;  Location: [facility];  Service: Gastroenterology;   Laterality: N/A;   NIPPLE SPARING MASTECTOMY Bilateral 11/09/2020   Procedure: BILATERAL NIPPLE SPARING MASTECTOMY;  Surgeon: [name], MD;  Location: [place];  Service: General;  Laterality: Bilateral;   PORTACATH PLACEMENT N/A 06/23/2020   Procedure: INSERTION PORT-A-CATH;  Surgeon: [name], MD;  Location: [facility];  Service: General;  Laterality: N/A;  START TIME OF 11:00 AM FOR 60 MINUTES [place] [facility]    There were no vitals filed for this visit.   Subjective Assessment - 12/06/20 1508     Subjective Patient underwent neoadjuvant chemotherapy from 06/25/2020 - 10/29/2020 followed by bilateral mastectomies with expanders placed on 11/09/2020. Twelve nodes were removed but all were negative. She will undergo radiation beginning 12/20/2020.    Pertinent History Patient was diagnosed on 06/04/2020 with right grade III invasive ductal carcinoma breast cancer. She neoadjuvant chemotherapy from 06/25/2020 - 10/29/2020 followed by bilateral mastectomies with expanders placed on 11/09/2020 with 0/12 axillary nodes positive for cancer. It is ER positive, PR negative, and HER2 negative with a Ki67 of 40%.    Patient Stated Goals See how my arm is doing    Currently in Pain? Yes    Pain Score 2     Pain Location Arm    Pain Orientation Right;Upper;Medial    Pain Descriptors / Indicators Tightness    Pain Type Surgical pain    Pain Onset 1 to 4 weeks ago    Pain Frequency Intermittent    Aggravating Factors  Abduction  Pain Relieving Factors Rest                [facility] PT Assessment - 12/06/20 0001       Assessment   Medical Diagnosis s/p bil mastectomies and right TAD    Referring Provider (PT) Dr. [name]    Onset Date/Surgical Date 11/09/20    Hand Dominance Right    Prior Therapy Baselines      Precautions   Precautions Other (comment)    Precaution Comments recent surgery and right targeted node dissection       Restrictions   Weight Bearing Restrictions No      Balance Screen   Has the patient fallen in the past 6 months No    Has the patient had a decrease in activity level because of a fear of falling?  No    Is the patient reluctant to leave their home because of a fear of falling?  No      Home Environment   Living Environment Private residence    Living Arrangements Children   43 y.o. daughter   Available Help at Discharge Family      Prior Function   Level of Independence Independent    Vocation Full time employment    Biochemist, clinical for [employer]      Cognition   Overall Cognitive Status Within Functional Limits for tasks assessed      Observation/Other Assessments   Observations Bil breast incisions appear to be healing well. Mild keloid scarring in right axillary incision but it is well healed.      Posture/Postural Control   Posture/Postural Control Postural limitations    Postural Limitations Rounded Shoulders;Forward head      ROM / Strength   AROM / PROM / Strength AROM      AROM   AROM Assessment Site Shoulder    Right/Left Shoulder Right;Left    Right Shoulder Extension 42 Degrees    Right Shoulder Flexion 148 Degrees    Right Shoulder ABduction 172 Degrees    Right Shoulder Internal Rotation 72 Degrees    Right Shoulder External Rotation 89 Degrees    Left Shoulder Extension 51 Degrees    Left Shoulder Flexion 151 Degrees    Left Shoulder ABduction 177 Degrees    Left Shoulder Internal Rotation 60 Degrees    Left Shoulder External Rotation 75 Degrees      Strength   Overall Strength Unable to assess;Due to precautions               LYMPHEDEMA/ONCOLOGY QUESTIONNAIRE - 12/06/20 0001       Type   Cancer Type Right breast cancer      Surgeries   Mastectomy Date 11/09/20    Axillary Lymph Node Dissection Date 11/09/20    Number Lymph Nodes Removed 12      Treatment   Active Chemotherapy Treatment No     Past Chemotherapy Treatment No    Active Radiation Treatment No    Past Radiation Treatment No    Current Hormone Treatment No    Past Hormone Therapy No      What other symptoms do you have   Are you Having Heaviness or Tightness Yes    Are you having Pain Yes    Are you having pitting edema No    Is it Hard or Difficult finding clothes that fit No    Do you have infections No  Is there Decreased scar mobility Yes    [place]mer Sign No      Lymphedema Assessments   Lymphedema Assessments Upper extremities      Right Upper Extremity Lymphedema   10 cm Proximal to Olecranon Process 27.8 cm    Olecranon Process 24.3 cm    10 cm Proximal to Ulnar Styloid Process 20.6 cm    Just Proximal to Ulnar Styloid Process 15.3 cm    Across Hand at [employer] 18.3 cm    At [place] of 2nd Digit 5.9 cm      Left Upper Extremity Lymphedema   10 cm Proximal to Olecranon Process 28.3 cm    Olecranon Process 24.3 cm    10 cm Proximal to Ulnar Styloid Process 20.2 cm    Just Proximal to Ulnar Styloid Process 15.5 cm    Across Hand at [employer] 18.3 cm    At [place] of 2nd Digit 6.1 cm                Quick Dash - 12/06/20 0001     Open a tight or new jar No difficulty    Do heavy household chores (wash walls, wash floors) No difficulty    Carry a shopping bag or briefcase No difficulty    Wash your back No difficulty    Use a knife to cut food No difficulty    Recreational activities in which you take some force or impact through your arm, shoulder, or hand (golf, hammering, tennis) No difficulty    During the past week, to what extent has your arm, shoulder or hand problem interfered with your normal social activities with family, friends, neighbors, or groups? Not at all    During the past week, to what extent has your arm, shoulder or hand problem limited your work or other regular daily activities Not at all    Arm, shoulder, or hand pain. Mild    Tingling (pins and needles)  in your arm, shoulder, or hand None    Difficulty Sleeping No difficulty    DASH Score 2.27 %                            PT Education - 12/06/20 1547     Education Details Aftercare; scar massage; lymphedema precautions    Person(s) Educated Patient    Methods Explanation;Demonstration;Handout    Comprehension Returned demonstration;Verbalized understanding                 PT Long Term Goals - 12/06/20 1551       PT LONG TERM GOAL #1   Title Patient will demonstate she has regained full shoulder ROM and function post operatively compared to baselines.    Time 6    Period Months    Status Achieved                   Plan - 12/06/20 1548     Clinical Impression Statement Patient is doing very well s/p bil mastectomies with reconstruction. She had 12 negative nodes removed and expanders placed on 11/09/2020. She underwent neoadjuvant chemotherapy from 06/25/2020 - 10/29/2020. She will begin radiation in 2 weeks. Her shoulder ROM and function is back to baseline. Her incisions are healing well. Her only complaint is a tightness present in her right medial upper arm that appears to be nerve pain so a stretch was given for that. She will participate in  the After Breast Cancer class and will call us if her pain does not resolve with her new HEP. She declined to do PT for that tightness/pain at this time due to starting radiation and having concerns about missing work.    PT Treatment/Interventions ADLs/Self Care Home Management;Therapeutic exercise;Patient/family education    PT Next Visit Plan D/C per pt request    PT Home Exercise Plan Nerve stretch right UE    Consulted and Agree with Plan of Care Patient             Patient will benefit from skilled therapeutic intervention in order to improve the following deficits and impairments:  Postural dysfunction, Decreased range of motion, Decreased knowledge of precautions, Impaired UE functional use, Pain,  Decreased scar mobility  Visit Diagnosis: Carcinoma of upper-outer quadrant of right breast in female, estrogen receptor positive ([facility])  Abnormal posture  Pain in right upper arm  Aftercare following surgery for neoplasm     Problem List Patient Active Problem List   Diagnosis Date Noted   Breast cancer ([facility]) 11/09/2020   Port-A-Cath in place 07/15/2020   Genetic testing 06/25/2020   Family history of breast cancer 06/11/2020   Malignant neoplasm of upper-outer quadrant of right breast in female, estrogen receptor positive ([facility]) 06/04/2020   Abnormal finding present on diagnostic imaging of uterus 05/07/2019   Vitamin D deficiency 09/24/2013   Crohn's colitis, with intestinal obstruction ([facility]) 09/17/2013   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 10/17/2010   PHYSICAL THERAPY DISCHARGE SUMMARY  Visits from Start of Care: 2  Current functional level related to goals / functional outcomes: Goals met. See above for objective findings.   Remaining deficits: Mild pain and tightness present in right medial upper arm.   Education / Equipment: HEP and lymphedema education  Patient agrees to discharge. Patient goals were met. Patient is being discharged due to meeting the stated rehab goals.  Annia Friendly, [place] 12/06/20 3:54 PM    [place], [place], [address] Phone: [phone]   Fax:  [phone]  Name: MARYLYNNE KEELIN MRN: [id number] Date of Birth: [birth year]

## 2020-12-07 ENCOUNTER — Telehealth: Payer: Self-pay | Admitting: Radiation Oncology

## 2020-12-07 NOTE — Telephone Encounter (Signed)
I called this morning and spoke with the patient to let her know that we were just made aware that she was still on the expansion process and still had 3 to 4 weeks to proceed with this.  I apologize that we would have to restimulate her, and she is understandably frustrated but understands the reasoning for making sure her  volume is appropriate for the time of treatment.  We will contact her to reschedule her simulation at the end of September.

## 2020-12-08 ENCOUNTER — Ambulatory Visit (INDEPENDENT_AMBULATORY_CARE_PROVIDER_SITE_OTHER): Payer: Managed Care, Other (non HMO) | Admitting: Plastic Surgery

## 2020-12-08 ENCOUNTER — Other Ambulatory Visit: Payer: Self-pay

## 2020-12-08 DIAGNOSIS — C50411 Malignant neoplasm of upper-outer quadrant of right female breast: Secondary | ICD-10-CM

## 2020-12-08 DIAGNOSIS — Z170 Estrogen receptor positive status [ER+]: Secondary | ICD-10-CM

## 2020-12-08 NOTE — Progress Notes (Signed)
Patient presents to continue her fills of her tissue expanders.  She feels like everything went fine last time.  She does appear to have some residual air in her expanders which I removed today.  50 additional cc of fluid were then added giving her a current volume of around 275 cc and 375 cc expanders.  She tolerated this well.  She would like to be a little bit bigger but feels like we are getting close.  I will plan to see her next week and what may be her last fill.

## 2020-12-09 ENCOUNTER — Ambulatory Visit: Payer: Managed Care, Other (non HMO)

## 2020-12-09 ENCOUNTER — Other Ambulatory Visit: Payer: Managed Care, Other (non HMO)

## 2020-12-10 ENCOUNTER — Inpatient Hospital Stay: Payer: Managed Care, Other (non HMO)

## 2020-12-10 ENCOUNTER — Other Ambulatory Visit: Payer: Self-pay

## 2020-12-10 VITALS — BP 121/85 | HR 83 | Temp 98.3°F | Resp 18 | Wt 150.5 lb

## 2020-12-10 DIAGNOSIS — Z5112 Encounter for antineoplastic immunotherapy: Secondary | ICD-10-CM | POA: Insufficient documentation

## 2020-12-10 DIAGNOSIS — Z170 Estrogen receptor positive status [ER+]: Secondary | ICD-10-CM

## 2020-12-10 DIAGNOSIS — C50411 Malignant neoplasm of upper-outer quadrant of right female breast: Secondary | ICD-10-CM | POA: Insufficient documentation

## 2020-12-10 DIAGNOSIS — Z95828 Presence of other vascular implants and grafts: Secondary | ICD-10-CM | POA: Insufficient documentation

## 2020-12-10 LAB — CBC WITH DIFFERENTIAL/PLATELET
Abs Immature Granulocytes: 0.01 10*3/uL (ref 0.00–0.07)
Basophils Absolute: 0 10*3/uL (ref 0.0–0.1)
Basophils Relative: 0 %
Eosinophils Absolute: 0.1 10*3/uL (ref 0.0–0.5)
Eosinophils Relative: 1 %
HCT: 30.4 % — ABNORMAL LOW (ref 36.0–46.0)
Hemoglobin: 9.9 g/dL — ABNORMAL LOW (ref 12.0–15.0)
Immature Granulocytes: 0 %
Lymphocytes Relative: 24 %
Lymphs Abs: 1.5 10*3/uL (ref 0.7–4.0)
MCH: 28.4 pg (ref 26.0–34.0)
MCHC: 32.6 g/dL (ref 30.0–36.0)
MCV: 87.1 fL (ref 80.0–100.0)
Monocytes Absolute: 0.5 10*3/uL (ref 0.1–1.0)
Monocytes Relative: 9 %
Neutro Abs: 4 10*3/uL (ref 1.7–7.7)
Neutrophils Relative %: 66 %
Platelets: 246 10*3/uL (ref 150–400)
RBC: 3.49 MIL/uL — ABNORMAL LOW (ref 3.87–5.11)
RDW: 15.6 % — ABNORMAL HIGH (ref 11.5–15.5)
WBC: 6.1 10*3/uL (ref 4.0–10.5)
nRBC: 0 % (ref 0.0–0.2)

## 2020-12-10 LAB — COMPREHENSIVE METABOLIC PANEL
ALT: 9 U/L (ref 0–44)
AST: 16 U/L (ref 15–41)
Albumin: 3.7 g/dL (ref 3.5–5.0)
Alkaline Phosphatase: 106 U/L (ref 38–126)
Anion gap: 13 (ref 5–15)
BUN: 11 mg/dL (ref 6–20)
CO2: 22 mmol/L (ref 22–32)
Calcium: 9.1 mg/dL (ref 8.9–10.3)
Chloride: 106 mmol/L (ref 98–111)
Creatinine, Ser: 0.87 mg/dL (ref 0.44–1.00)
GFR, Estimated: >60 mL/min (ref >60)
Glucose, Bld: 90 mg/dL (ref 70–99)
Potassium: 3.8 mmol/L (ref 3.5–5.1)
Sodium: 141 mmol/L (ref 135–145)
Total Bilirubin: 0.5 mg/dL (ref 0.3–1.2)
Total Protein: 7.2 g/dL (ref 6.5–8.1)

## 2020-12-10 LAB — MAGNESIUM: Magnesium: 1.9 mg/dL (ref 1.7–2.4)

## 2020-12-10 MED ORDER — HEPARIN SOD (PORK) LOCK FLUSH 100 UNIT/ML IV SOLN
500.0000 [IU] | Freq: Once | INTRAVENOUS | Status: AC | PRN
  Administered 2020-12-10: 500 [IU]

## 2020-12-10 MED ORDER — SODIUM CHLORIDE 0.9 % IV SOLN: Freq: Once | INTRAVENOUS | Status: AC

## 2020-12-10 MED ORDER — SODIUM CHLORIDE 0.9% FLUSH
10.0000 mL | INTRAVENOUS | Status: DC | PRN
  Administered 2020-12-10: 10 mL

## 2020-12-10 MED ORDER — DIPHENHYDRAMINE HCL 25 MG PO CAPS
25.0000 mg | ORAL_CAPSULE | Freq: Once | ORAL | Status: AC
Start: 2020-12-10 — End: 2020-12-10
  Administered 2020-12-10: 25 mg via ORAL
  Filled 2020-12-10: qty 1

## 2020-12-10 MED ORDER — TRASTUZUMAB-ANNS CHEMO 150 MG IV SOLR
420.0000 mg | Freq: Once | INTRAVENOUS | Status: AC
  Administered 2020-12-10: 420 mg via INTRAVENOUS
  Filled 2020-12-10: qty 20

## 2020-12-10 MED ORDER — SODIUM CHLORIDE 0.9% FLUSH
10.0000 mL | Freq: Once | INTRAVENOUS | Status: AC
  Administered 2020-12-10: 10 mL

## 2020-12-10 MED ORDER — ACETAMINOPHEN 325 MG PO TABS
650.0000 mg | ORAL_TABLET | Freq: Once | ORAL | Status: AC
  Administered 2020-12-10: 650 mg via ORAL
  Filled 2020-12-10: qty 2

## 2020-12-10 MED ORDER — SODIUM CHLORIDE 0.9 % IV SOLN
420.0000 mg | Freq: Once | INTRAVENOUS | Status: AC
  Administered 2020-12-10: 420 mg via INTRAVENOUS
  Filled 2020-12-10: qty 14

## 2020-12-10 NOTE — Patient Instructions (Signed)
[facility]   Discharge Instructions: Thank you for choosing [facility] to provide your oncology and hematology care.   If you have a lab appointment with the [facility], please go directly to the [facility] and check in at the registration area.   Wear comfortable clothing and clothing appropriate for easy access to any Portacath or PICC line.   We strive to give you quality time with your provider. You may need to reschedule your appointment if you arrive late (15 or more minutes).  Arriving late affects you and other patients whose appointments are after yours.  Also, if you miss three or more appointments without notifying the office, you may be dismissed from the clinic at the provider's discretion.      For prescription refill requests, have your pharmacy contact our office and allow 72 hours for refills to be completed.    Today you received the following chemotherapy and/or immunotherapy agents: Trastuzumab (Herceptin) and Pertuzumab (Perjeta)      To help prevent nausea and vomiting after your treatment, we encourage you to take your nausea medication as directed.  BELOW ARE SYMPTOMS THAT SHOULD BE REPORTED IMMEDIATELY: *FEVER GREATER THAN 100.4 F (38 C) OR HIGHER *CHILLS OR SWEATING *NAUSEA AND VOMITING THAT IS NOT CONTROLLED WITH YOUR NAUSEA MEDICATION *UNUSUAL SHORTNESS OF BREATH *UNUSUAL BRUISING OR BLEEDING *URINARY PROBLEMS (pain or burning when urinating, or frequent urination) *BOWEL PROBLEMS (unusual diarrhea, constipation, pain near the anus) TENDERNESS IN MOUTH AND THROAT WITH OR WITHOUT PRESENCE OF ULCERS (sore throat, sores in mouth, or a toothache) UNUSUAL RASH, SWELLING OR PAIN  UNUSUAL VAGINAL DISCHARGE OR ITCHING   Items with * indicate a potential emergency and should be followed up as soon as possible or go to the Emergency Department if any problems should occur.  Please show the CHEMOTHERAPY ALERT CARD or  IMMUNOTHERAPY ALERT CARD at check-in to the Emergency Department and triage nurse.  Should you have questions after your visit or need to cancel or reschedule your appointment, please contact [facility]  Dept: [phone]  and follow the prompts.  Office hours are 8:00 a.m. to 4:30 p.m. Monday - Friday. Please note that voicemails left after 4:00 p.m. may not be returned until the following business day.  We are closed weekends and major holidays. You have access to a nurse at all times for urgent questions. Please call the main number to the clinic Dept: [phone] and follow the prompts.   For any non-urgent questions, you may also contact your provider using MyChart. We now offer e-Visits for anyone 15 and older to request care online for non-urgent symptoms. For details visit [email or web address].   Also download the MyChart app! Go to the app store, search "MyChart", open the app, select [facility], and log in with your MyChart username and password.  Due to Covid, a mask is required upon entering the hospital/clinic. If you do not have a mask, one will be given to you upon arrival. For doctor visits, patients may have 1 support person aged 83 or older with them. For treatment visits, patients cannot have anyone with them due to current Covid guidelines and our immunocompromised population.

## 2020-12-14 ENCOUNTER — Encounter: Payer: Self-pay | Admitting: *Deleted

## 2020-12-15 ENCOUNTER — Encounter: Payer: Self-pay | Admitting: Plastic Surgery

## 2020-12-15 ENCOUNTER — Other Ambulatory Visit: Payer: Self-pay

## 2020-12-15 ENCOUNTER — Ambulatory Visit (INDEPENDENT_AMBULATORY_CARE_PROVIDER_SITE_OTHER): Payer: Managed Care, Other (non HMO) | Admitting: Surgical

## 2020-12-15 DIAGNOSIS — C50411 Malignant neoplasm of upper-outer quadrant of right female breast: Secondary | ICD-10-CM

## 2020-12-15 DIAGNOSIS — Z170 Estrogen receptor positive status [ER+]: Secondary | ICD-10-CM

## 2020-12-15 NOTE — Progress Notes (Signed)
Patient is a 46 year old female here for follow-up on her bilateral breast reconstruction with Dr. Claudia Desanctis.  She currently has 275/375 in each breast expander.  She tolerated her last fill fine.  She would like an additional fill today.  She reports that there is plans for radiation, she believes they are planning to do this after expansion but prior to implant exchange.  Chaperone present on exam On exam bilateral breast incisions are intact, bilateral nipple areolar complexes are healing well.  No erythema or cellulitic changes noted.  No subcutaneous fluid collections and with palpation.  We placed injectable saline in the Expander using a sterile technique: Right: 50 cc for a total of 325 / 375 cc Left: 50 cc for a total of 325 / 375 cc  Patient is doing well in regards to her bilateral breast reconstruction, recommend following up in 1 week for reevaluation and additional fill.  She is getting close to her desired size.

## 2020-12-20 ENCOUNTER — Ambulatory Visit: Payer: Managed Care, Other (non HMO) | Admitting: Radiation Oncology

## 2020-12-21 ENCOUNTER — Ambulatory Visit: Payer: Managed Care, Other (non HMO)

## 2020-12-22 ENCOUNTER — Ambulatory Visit: Payer: Managed Care, Other (non HMO)

## 2020-12-23 ENCOUNTER — Ambulatory Visit (INDEPENDENT_AMBULATORY_CARE_PROVIDER_SITE_OTHER): Payer: Managed Care, Other (non HMO) | Admitting: Plastic Surgery

## 2020-12-23 ENCOUNTER — Encounter: Payer: Self-pay | Admitting: Hematology and Oncology

## 2020-12-23 ENCOUNTER — Other Ambulatory Visit: Payer: Self-pay

## 2020-12-23 ENCOUNTER — Ambulatory Visit: Payer: Managed Care, Other (non HMO)

## 2020-12-23 DIAGNOSIS — C50411 Malignant neoplasm of upper-outer quadrant of right female breast: Secondary | ICD-10-CM

## 2020-12-23 DIAGNOSIS — Z170 Estrogen receptor positive status [ER+]: Secondary | ICD-10-CM

## 2020-12-23 NOTE — Progress Notes (Signed)
Patient presents for further refills regarding her tissue expanders.  She currently has 325 cc and 375 cc expanders.  She would like to be bigger.  I added 75 cc to both sides so she now has 400 cc and 375 cc expanders.  She feels pretty good about the size but wants to think about it.  We will make an appointment for her to come back next week in the event that she wants some additional volume.  The nipple on the left side is riding a little bit higher than the right side but I do believe I be able to correct this by excising some of the skin along the IMF at the time of the implant exchange.  It could be that this improves after getting rid of the distortion caused by the shape of the expander as well.  Additionally if she is radiated on the right side that may lift the nipple on that side which might bring it into a more symmetric position.  Sounds like she is scheduled for a initial appointment and radiation in 2 weeks which should work out fine from our standpoint.

## 2020-12-24 ENCOUNTER — Ambulatory Visit: Payer: Managed Care, Other (non HMO)

## 2020-12-27 ENCOUNTER — Encounter: Payer: Self-pay | Admitting: Hematology and Oncology

## 2020-12-27 ENCOUNTER — Telehealth: Payer: Self-pay | Admitting: Radiation Oncology

## 2020-12-27 ENCOUNTER — Ambulatory Visit: Payer: Managed Care, Other (non HMO)

## 2020-12-27 NOTE — Telephone Encounter (Signed)
I called and left the patient a voicemail to see if she desired to move up her simulation to this week following her visit with plastics on Thursday.  She may or may not have additional expansion but this would be the last anticipated fill of her expander.  I gave her our number to call back if she is interested in moving forward with simulation on Friday.

## 2020-12-28 ENCOUNTER — Ambulatory Visit: Payer: Managed Care, Other (non HMO)

## 2020-12-29 ENCOUNTER — Ambulatory Visit: Payer: Managed Care, Other (non HMO)

## 2020-12-30 ENCOUNTER — Other Ambulatory Visit: Payer: Managed Care, Other (non HMO)

## 2020-12-30 ENCOUNTER — Ambulatory Visit: Payer: Managed Care, Other (non HMO) | Admitting: Hematology and Oncology

## 2020-12-30 ENCOUNTER — Ambulatory Visit: Payer: Managed Care, Other (non HMO)

## 2020-12-30 ENCOUNTER — Other Ambulatory Visit: Payer: Self-pay

## 2020-12-30 ENCOUNTER — Ambulatory Visit (INDEPENDENT_AMBULATORY_CARE_PROVIDER_SITE_OTHER): Payer: Managed Care, Other (non HMO) | Admitting: Surgical

## 2020-12-30 DIAGNOSIS — C50411 Malignant neoplasm of upper-outer quadrant of right female breast: Secondary | ICD-10-CM

## 2020-12-30 DIAGNOSIS — Z170 Estrogen receptor positive status [ER+]: Secondary | ICD-10-CM

## 2020-12-30 NOTE — Progress Notes (Signed)
Patient Care Team: Patient, [name] (Inactive) as PCP - General (General Practice) [name], RN as Oncology Nurse Navigator [name], RN as Oncology Nurse Navigator  DIAGNOSIS:    ICD-10-CM   1. 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 palpable right breast mass measuring about 7 cm x 5 and half centimeters, palpable right axillary lymph node with concern for skin invasion    06/08/2020 Cancer Staging   Staging form: Breast, AJCC 8th Edition - Clinical: Stage IIIB (cT4d, cN1, cM0, G3, ER+, PR-, HER2+) - Signed by [name], MD on 07/07/2020 Histologic grading system: 3 grade system   06/24/2020 Genetic Testing   No pathogenic variants detected in [facility]RNA[facility].  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  The report date is June 24, 2020.   The CustomNext-Cancer+RNAinsight panel offered by [facility] includes sequencing and rearrangement analysis for the following 47 genes:  APC, ATM, AXIN2, BARD1, BMPR1A, BRCA1, BRCA2, BRIP1, CDH1, CDK4, CDKN2A, CHEK2, DICER1, EPCAM, GREM1, HOXB13, MEN1, MLH1, MSH2, MSH3, MSH6, MUTYH, NBN, NF1, NF2, NTHL1, PALB2, PMS2, POLD1, POLE, PTEN, RAD51C, RAD51D, RECQL, RET, SDHA, SDHAF2, SDHB, SDHC, SDHD, SMAD4, SMARCA4, STK11, TP53, TSC1, TSC2, and VHL.  RNA data is routinely analyzed for use in variant interpretation for all genes.   06/25/2020  - 10/29/2020 Chemotherapy   TCHP x6 cycles   11/09/2020 Surgery   Bilateral mastectomies: Left mastectomy: Benign Right mastectomy: Residual microinvasive cancer status post neoadjuvant therapy, DCIS, 0/12 lymph nodes, ER +1%, PR negative, HER2 equivocal 2+ by [facility]   11/19/2020 -  Chemotherapy    Patient is on Treatment Plan: BREAST TRASTUZUMAB  + PERTUZUMAB Q21D X 13 CYCLES         CHIEF COMPLIANT: Cycle 3 Herceptin and Perjeta  INTERVAL HISTORY: PAYGE EPPES is a 45 y.o. with above-mentioned history of HER-2 positive right breast cancer currently on neoadjuvant chemotherapy with [place], but Perjeta and Taxotere were discontinued due to gastrointestinal side effects. She presents to the clinic today for a toxicity check and treatment.  She is tolerating Herceptin and Perjeta extremely well without any major side effects.  ALLERGIES:  is allergic to other.  MEDICATIONS:  Current Outpatient Medications  Medication Sig Dispense Refill   ferrous sulfate 325 (65 FE) MG tablet Take 325 mg by mouth daily with breakfast.     lidocaine-prilocaine (EMLA) cream Apply 1 application topically as needed (Prior to port access). 30 g 2   mesalamine (LIALDA) 1.2 g EC tablet Take 2 tablets (2.4 g total) by mouth 2 (two) times daily with a meal. 120 tablet 11   No current facility-administered medications for this visit.    PHYSICAL EXAMINATION: ECOG PERFORMANCE STATUS: 1 - Symptomatic but completely ambulatory  Vitals:   12/31/20 0854  BP: 138/87  Pulse: 77  Resp: 17  Temp: (!) 97.5 F (36.4 C)  SpO2: 100%   Filed Weights  12/31/20 0854  Weight: 153 lb (69.4 kg)      LABORATORY DATA:  I have reviewed the data as listed CMP Latest Ref Rng & Units 12/10/2020 11/19/2020 10/07/2020  Glucose 70 - 99 mg/dL 90 79 107(H)  BUN 6 - 20 mg/dL 11 11 13   Creatinine 0.44 - 1.00 mg/dL 0.87 0.94 0.73  Sodium 135 - 145 mmol/L 141 138 141  Potassium 3.5 - 5.1 mmol/L 3.8 4.0 3.8  Chloride 98 -  111 mmol/L 106 106 104  CO2 22 - 32 mmol/L 22 21(L) 25  Calcium 8.9 - 10.3 mg/dL 9.1 9.1 9.9  Total Protein 6.5 - 8.1 g/dL 7.2 6.7 7.7  Total Bilirubin 0.3 - 1.2 mg/dL 0.5 <0.2(L) 0.4  Alkaline Phos 38 - 126 U/L 106 93 109  AST 15 - 41 U/L 16 24 15   ALT 0 - 44 U/L 9 24 14     Lab Results  Component Value Date   WBC 5.6 12/31/2020   HGB 10.4 (L) 12/31/2020   HCT 32.9 (L) 12/31/2020   MCV 85.5 12/31/2020   PLT 230 12/31/2020   NEUTROABS 3.9 12/31/2020    ASSESSMENT & PLAN:  Malignant neoplasm of upper-outer quadrant of right breast in female, estrogen receptor positive ([place]) 06/03/2020, US guided biopsy of the right breast 9 0 clock mass showed grade III IDC; Prognostics showed ER 40% positive, weak staining, PR positive 0 %, Her 2 3 +, Ki 67 30% 4dCN1M0 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 palpable right breast mass measuring about 7 cm x 5 and half centimeters, palpable right axillary lymph node with concern for skin invasion    Treatment Plan: 1. Neoadjuvant chemo with TCHP q 3 weeks X 6 followed by HP (until 07/29/2021) 2. 11/09/2020 Bilateral mastectomies: Left mastectomy: Benign Right mastectomy: Residual microinvasive cancer status post neoadjuvant therapy, DCIS, 0/12 lymph nodes, ER +1%, PR negative, HER2 equivocal 2+ by IHC 3. Adj XRT 4. Adj Anti-estrogen therapy 5. Neratinib --------------------------------------------------------------------------------------------------------- Treatment plan: 1.  Herceptin Perjeta maintenance 2. Adjuvant radiation 01/11/2021-02/23/2021   Patient expressed concerns regarding coming daily for radiation and is hoping to see if there are any options for transportation assistance.  She is worried about the financial impact of gas cost. We will obtain an echocardiogram Return to clinic every 3 weeks for Herceptin Perjeta and every 6 weeks for labs Herceptin Perjeta and MD follow-up.    Orders Placed This Encounter  Procedures    ECHOCARDIOGRAM COMPLETE    Standing Status:   Future    Standing Expiration Date:   12/31/2021    Scheduling Instructions:     Please do it wither before or after radiation appt    Order Specific Question:   Where should this test be performed    Answer:   [facility]    Order Specific Question:   Perflutren DEFINITY (image enhancing agent) should be administered unless hypersensitivity or allergy exist    Answer:   Administer Perflutren    Order Specific Question:   Reason for exam-Echo    Answer:   Chemo  Z09    Order Specific Question:   Release to patient    Answer:   Immediate    The patient has a good understanding of the overall plan. she agrees with it. she will call with any problems that may develop before the next visit here.  Total time spent: 30 mins including face to face time and time spent for planning, charting and coordination of care  Rulon Eisenmenger, MD, MPH 12/31/2020  I, Thana Ates, am acting as scribe for Dr. [name].  I have reviewed the above documentation for accuracy and completeness, and I agree with the above.

## 2020-12-30 NOTE — Progress Notes (Signed)
Patient is a 45 year old female here for follow-up on her bilateral breast reconstruction.  She currently has 400 cc in 375 cc expanders and would like a little bit additional fluid.  I added 20 cc to each expander and she now is 420 and 375 cc expanders.  She feels much better about today's size after the fill.  She is scheduled to begin radiation October 3 and this will end mid November.  We would wait 6 months prior to exchange surgery so we will plan to see her in January sometime to discuss planning for exchange.  On exam incisions are intact, bilateral NAC's are viable, no erythema or subcutaneous fluid collections are noted.  Recommend continue wear compressive garments when active, she does not need to wear a compressive garment at night any longer.  Recommend following up in January, I discussed with her she can certainly call with any questions or concerns and we could see her sooner if anything changes.  Pictures taken and placed in the patient's chart with patient's permission.

## 2020-12-31 ENCOUNTER — Ambulatory Visit: Payer: Managed Care, Other (non HMO)

## 2020-12-31 ENCOUNTER — Ambulatory Visit
Admission: RE | Admit: 2020-12-31 | Discharge: 2020-12-31 | Disposition: A | Discharge status: Home / Self Care | Payer: Managed Care, Other (non HMO) | Source: Ambulatory Visit | Attending: Radiation Oncology | Admitting: Radiation Oncology

## 2020-12-31 ENCOUNTER — Inpatient Hospital Stay (HOSPITAL_BASED_OUTPATIENT_CLINIC_OR_DEPARTMENT_OTHER): Payer: Managed Care, Other (non HMO) | Admitting: Hematology and Oncology

## 2020-12-31 ENCOUNTER — Inpatient Hospital Stay: Payer: Managed Care, Other (non HMO)

## 2020-12-31 DIAGNOSIS — Z170 Estrogen receptor positive status [ER+]: Secondary | ICD-10-CM | POA: Diagnosis not present

## 2020-12-31 DIAGNOSIS — Z95828 Presence of other vascular implants and grafts: Secondary | ICD-10-CM

## 2020-12-31 DIAGNOSIS — C50411 Malignant neoplasm of upper-outer quadrant of right female breast: Secondary | ICD-10-CM | POA: Diagnosis not present

## 2020-12-31 LAB — COMPREHENSIVE METABOLIC PANEL
ALT: 8 U/L (ref 0–44)
AST: 13 U/L — ABNORMAL LOW (ref 15–41)
Albumin: 3.8 g/dL (ref 3.5–5.0)
Alkaline Phosphatase: 105 U/L (ref 38–126)
Anion gap: 10 (ref 5–15)
BUN: 7 mg/dL (ref 6–20)
CO2: 25 mmol/L (ref 22–32)
Calcium: 9.3 mg/dL (ref 8.9–10.3)
Chloride: 106 mmol/L (ref 98–111)
Creatinine, Ser: 0.81 mg/dL (ref 0.44–1.00)
GFR, Estimated: >60 mL/min (ref >60)
Glucose, Bld: 91 mg/dL (ref 70–99)
Potassium: 3.7 mmol/L (ref 3.5–5.1)
Sodium: 141 mmol/L (ref 135–145)
Total Bilirubin: 0.4 mg/dL (ref 0.3–1.2)
Total Protein: 7.4 g/dL (ref 6.5–8.1)

## 2020-12-31 LAB — CBC WITH DIFFERENTIAL/PLATELET
Abs Immature Granulocytes: 0.01 10*3/uL (ref 0.00–0.07)
Basophils Absolute: 0 10*3/uL (ref 0.0–0.1)
Basophils Relative: 0 %
Eosinophils Absolute: 0 10*3/uL (ref 0.0–0.5)
Eosinophils Relative: 1 %
HCT: 32.9 % — ABNORMAL LOW (ref 36.0–46.0)
Hemoglobin: 10.4 g/dL — ABNORMAL LOW (ref 12.0–15.0)
Immature Granulocytes: 0 %
Lymphocytes Relative: 22 %
Lymphs Abs: 1.2 10*3/uL (ref 0.7–4.0)
MCH: 27 pg (ref 26.0–34.0)
MCHC: 31.6 g/dL (ref 30.0–36.0)
MCV: 85.5 fL (ref 80.0–100.0)
Monocytes Absolute: 0.4 10*3/uL (ref 0.1–1.0)
Monocytes Relative: 7 %
Neutro Abs: 3.9 10*3/uL (ref 1.7–7.7)
Neutrophils Relative %: 70 %
Platelets: 230 10*3/uL (ref 150–400)
RBC: 3.85 MIL/uL — ABNORMAL LOW (ref 3.87–5.11)
RDW: 14.4 % (ref 11.5–15.5)
WBC: 5.6 10*3/uL (ref 4.0–10.5)
nRBC: 0 % (ref 0.0–0.2)

## 2020-12-31 LAB — MAGNESIUM: Magnesium: 1.8 mg/dL (ref 1.7–2.4)

## 2020-12-31 MED ORDER — ACETAMINOPHEN 325 MG PO TABS
650.0000 mg | ORAL_TABLET | Freq: Once | ORAL | Status: AC
  Administered 2020-12-31: 650 mg via ORAL

## 2020-12-31 MED ORDER — SODIUM CHLORIDE 0.9% FLUSH
10.0000 mL | INTRAVENOUS | Status: DC | PRN
  Administered 2020-12-31: 10 mL

## 2020-12-31 MED ORDER — HEPARIN SOD (PORK) LOCK FLUSH 100 UNIT/ML IV SOLN
500.0000 [IU] | Freq: Once | INTRAVENOUS | Status: AC | PRN
  Administered 2020-12-31: 500 [IU]

## 2020-12-31 MED ORDER — DIPHENHYDRAMINE HCL 25 MG PO CAPS
25.0000 mg | ORAL_CAPSULE | Freq: Once | ORAL | Status: AC
  Administered 2020-12-31: 25 mg via ORAL

## 2020-12-31 MED ORDER — ACETAMINOPHEN 325 MG PO TABS
ORAL_TABLET | ORAL | Status: AC
  Filled 2020-12-31: qty 2

## 2020-12-31 MED ORDER — TRASTUZUMAB-ANNS CHEMO 150 MG IV SOLR
420.0000 mg | Freq: Once | INTRAVENOUS | Status: AC
  Administered 2020-12-31: 420 mg via INTRAVENOUS
  Filled 2020-12-31: qty 20

## 2020-12-31 MED ORDER — DIPHENHYDRAMINE HCL 25 MG PO CAPS
ORAL_CAPSULE | ORAL | Status: AC
  Filled 2020-12-31: qty 2

## 2020-12-31 MED ORDER — SODIUM CHLORIDE 0.9 % IV SOLN: Freq: Once | INTRAVENOUS | Status: AC

## 2020-12-31 MED ORDER — SODIUM CHLORIDE 0.9% FLUSH
10.0000 mL | Freq: Once | INTRAVENOUS | Status: AC
  Administered 2020-12-31: 10 mL

## 2020-12-31 MED ORDER — SODIUM CHLORIDE 0.9 % IV SOLN
420.0000 mg | Freq: Once | INTRAVENOUS | Status: AC
  Administered 2020-12-31: 420 mg via INTRAVENOUS
  Filled 2020-12-31: qty 14

## 2020-12-31 NOTE — Assessment & Plan Note (Signed)
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 palpable right breast mass measuring about 7 cm x 5 and half centimeters, palpable right axillary lymph node with concern for skin invasion  Treatment Plan: 1. Neoadjuvant chemo with TCHP q 3 weeks X 6 followed by HP 2. Mastectomy with TAD 3. Adj XRT 4. Adj Anti-estrogen therapy 5. Neratinib --------------------------------------------------------------------------------------------------------- Bilateral mastectomies: Left mastectomy: Benign Right mastectomy: Residual microinvasive cancer status post neoadjuvant therapy, DCIS, 0/12 lymph nodes, ER +1%, PR negative, HER2 equivocal 2+ by IHC  Treatment plan: 1.  Herceptin Perjeta maintenance 2. Adjuvant radiation 01/11/2021-02/23/2021  Return to clinic every 3 weeks for Herceptin Perjeta and every 6 weeks for labs Herceptin Perjeta and MD follow-up.

## 2020-12-31 NOTE — Patient Instructions (Signed)
[facility]COL[facility]   Discharge Instructions: Thank you for choosing [facility] to provide your oncology and hematology care.   If you have a lab appointment with the [facility], please go directly to the [facility] and check in at the registration area.   Wear comfortable clothing and clothing appropriate for easy access to any Portacath or PICC line.   We strive to give you quality time with your provider. You may need to reschedule your appointment if you arrive late (15 or more minutes).  Arriving late affects you and other patients whose appointments are after yours.  Also, if you miss three or more appointments without notifying the office, you may be dismissed from the clinic at the provider's discretion.      For prescription refill requests, have your pharmacy contact our office and allow 72 hours for refills to be completed.    Today you received the following chemotherapy and/or immunotherapy agents: Trastuzumab (Herceptin) and Pertuzumab (Perjeta)      To help prevent nausea and vomiting after your treatment, we encourage you to take your nausea medication as directed.  BELOW ARE SYMPTOMS THAT SHOULD BE REPORTED IMMEDIATELY: *FEVER GREATER THAN 100.4 F (38 C) OR HIGHER *CHILLS OR SWEATING *NAUSEA AND VOMITING THAT IS NOT CONTROLLED WITH YOUR NAUSEA MEDICATION *UNUSUAL SHORTNESS OF BREATH *UNUSUAL BRUISING OR BLEEDING *URINARY PROBLEMS (pain or burning when urinating, or frequent urination) *BOWEL PROBLEMS (unusual diarrhea, constipation, pain near the anus) TENDERNESS IN MOUTH AND THROAT WITH OR WITHOUT PRESENCE OF ULCERS (sore throat, sores in mouth, or a toothache) UNUSUAL RASH, SWELLING OR PAIN  UNUSUAL VAGINAL DISCHARGE OR ITCHING   Items with * indicate a potential emergency and should be followed up as soon as possible or go to the Emergency Department if any problems should occur.  Please show the CHEMOTHERAPY ALERT CARD or  IMMUNOTHERAPY ALERT CARD at check-in to the Emergency Department and triage nurse.  Should you have questions after your visit or need to cancel or reschedule your appointment, please contact [facility]  Dept: [phone]  and follow the prompts.  Office hours are 8:00 a.m. to 4:30 p.m. Monday - Friday. Please note that voicemails left after 4:00 p.m. may not be returned until the following business day.  We are closed weekends and major holidays. You have access to a nurse at all times for urgent questions. Please call the main number to the clinic Dept: [phone] and follow the prompts.   For any non-urgent questions, you may also contact your provider using MyChart. We now offer e-Visits for anyone 76 and older to request care online for non-urgent symptoms. For details visit [email or web address].   Also download the MyChart app! Go to the app store, search "MyChart", open the app, select [facility], and log in with your MyChart username and password.  Due to Covid, a mask is required upon entering the hospital/clinic. If you do not have a mask, one will be given to you upon arrival. For doctor visits, patients may have 1 support person aged 36 or older with them. For treatment visits, patients cannot have anyone with them due to current Covid guidelines and our immunocompromised population.

## 2021-01-03 ENCOUNTER — Ambulatory Visit: Payer: Managed Care, Other (non HMO)

## 2021-01-04 ENCOUNTER — Ambulatory Visit: Payer: Managed Care, Other (non HMO)

## 2021-01-05 ENCOUNTER — Ambulatory Visit: Payer: Managed Care, Other (non HMO) | Admitting: Radiation Oncology

## 2021-01-05 ENCOUNTER — Ambulatory Visit: Payer: Managed Care, Other (non HMO)

## 2021-01-06 ENCOUNTER — Ambulatory Visit: Payer: Managed Care, Other (non HMO)

## 2021-01-07 ENCOUNTER — Ambulatory Visit: Payer: Managed Care, Other (non HMO)

## 2021-01-08 DIAGNOSIS — Z510 Encounter for antineoplastic radiation therapy: Secondary | ICD-10-CM | POA: Insufficient documentation

## 2021-01-08 DIAGNOSIS — Z95828 Presence of other vascular implants and grafts: Secondary | ICD-10-CM | POA: Insufficient documentation

## 2021-01-08 DIAGNOSIS — C50411 Malignant neoplasm of upper-outer quadrant of right female breast: Secondary | ICD-10-CM | POA: Insufficient documentation

## 2021-01-08 DIAGNOSIS — Z170 Estrogen receptor positive status [ER+]: Secondary | ICD-10-CM | POA: Insufficient documentation

## 2021-01-08 DIAGNOSIS — Z5112 Encounter for antineoplastic immunotherapy: Secondary | ICD-10-CM | POA: Insufficient documentation

## 2021-01-09 DIAGNOSIS — Z510 Encounter for antineoplastic radiation therapy: Secondary | ICD-10-CM | POA: Diagnosis present

## 2021-01-09 DIAGNOSIS — Z5112 Encounter for antineoplastic immunotherapy: Secondary | ICD-10-CM | POA: Diagnosis not present

## 2021-01-09 DIAGNOSIS — Z95828 Presence of other vascular implants and grafts: Secondary | ICD-10-CM | POA: Diagnosis not present

## 2021-01-09 DIAGNOSIS — Z170 Estrogen receptor positive status [ER+]: Secondary | ICD-10-CM | POA: Diagnosis not present

## 2021-01-09 DIAGNOSIS — C50411 Malignant neoplasm of upper-outer quadrant of right female breast: Secondary | ICD-10-CM | POA: Diagnosis present

## 2021-01-10 ENCOUNTER — Ambulatory Visit: Payer: Managed Care, Other (non HMO)

## 2021-01-10 ENCOUNTER — Ambulatory Visit
Admission: RE | Admit: 2021-01-10 | Payer: Managed Care, Other (non HMO) | Source: Ambulatory Visit | Admitting: Radiation Oncology

## 2021-01-11 ENCOUNTER — Other Ambulatory Visit: Payer: Self-pay

## 2021-01-11 ENCOUNTER — Ambulatory Visit
Admission: RE | Admit: 2021-01-11 | Discharge: 2021-01-11 | Disposition: A | Discharge status: Home / Self Care | Payer: Managed Care, Other (non HMO) | Source: Ambulatory Visit | Attending: Radiation Oncology | Admitting: Radiation Oncology

## 2021-01-11 ENCOUNTER — Ambulatory Visit: Payer: Managed Care, Other (non HMO)

## 2021-01-11 DIAGNOSIS — Z5112 Encounter for antineoplastic immunotherapy: Secondary | ICD-10-CM | POA: Diagnosis not present

## 2021-01-12 ENCOUNTER — Ambulatory Visit: Payer: Managed Care, Other (non HMO)

## 2021-01-12 ENCOUNTER — Other Ambulatory Visit (HOSPITAL_COMMUNITY): Payer: Managed Care, Other (non HMO)

## 2021-01-12 ENCOUNTER — Ambulatory Visit (HOSPITAL_COMMUNITY)
Admission: RE | Admit: 2021-01-12 | Discharge: 2021-01-12 | Disposition: A | Discharge status: Home / Self Care | Payer: Managed Care, Other (non HMO) | Source: Ambulatory Visit | Attending: Hematology and Oncology | Admitting: Hematology and Oncology

## 2021-01-12 ENCOUNTER — Encounter: Payer: Self-pay | Admitting: *Deleted

## 2021-01-12 ENCOUNTER — Ambulatory Visit
Admission: RE | Admit: 2021-01-12 | Discharge: 2021-01-12 | Disposition: A | Discharge status: Home / Self Care | Payer: Managed Care, Other (non HMO) | Source: Ambulatory Visit | Attending: Radiation Oncology | Admitting: Radiation Oncology

## 2021-01-12 DIAGNOSIS — Z170 Estrogen receptor positive status [ER+]: Secondary | ICD-10-CM | POA: Diagnosis not present

## 2021-01-12 DIAGNOSIS — Z0189 Encounter for other specified special examinations: Secondary | ICD-10-CM

## 2021-01-12 DIAGNOSIS — C50411 Malignant neoplasm of upper-outer quadrant of right female breast: Secondary | ICD-10-CM | POA: Insufficient documentation

## 2021-01-12 DIAGNOSIS — Z08 Encounter for follow-up examination after completed treatment for malignant neoplasm: Secondary | ICD-10-CM | POA: Diagnosis not present

## 2021-01-12 DIAGNOSIS — Z79899 Other long term (current) drug therapy: Secondary | ICD-10-CM | POA: Diagnosis not present

## 2021-01-12 DIAGNOSIS — Z5181 Encounter for therapeutic drug level monitoring: Secondary | ICD-10-CM | POA: Diagnosis not present

## 2021-01-12 DIAGNOSIS — Z5112 Encounter for antineoplastic immunotherapy: Secondary | ICD-10-CM | POA: Diagnosis not present

## 2021-01-12 LAB — ECHOCARDIOGRAM COMPLETE
Area-P 1/2: 5.62 cm2
S' Lateral: 2.7 cm

## 2021-01-12 NOTE — Progress Notes (Signed)
  Echocardiogram 2D Echocardiogram has been performed.  [name] 01/12/2021, 9:55 AM

## 2021-01-13 ENCOUNTER — Other Ambulatory Visit: Payer: Self-pay

## 2021-01-13 ENCOUNTER — Ambulatory Visit: Payer: Managed Care, Other (non HMO)

## 2021-01-13 ENCOUNTER — Ambulatory Visit
Admission: RE | Admit: 2021-01-13 | Discharge: 2021-01-13 | Disposition: A | Discharge status: Home / Self Care | Payer: Managed Care, Other (non HMO) | Source: Ambulatory Visit | Attending: Radiation Oncology | Admitting: Radiation Oncology

## 2021-01-13 DIAGNOSIS — Z5112 Encounter for antineoplastic immunotherapy: Secondary | ICD-10-CM | POA: Diagnosis not present

## 2021-01-13 NOTE — Progress Notes (Signed)
Pt here for patient teaching.  Pt given Radiation and You booklet, skin care instructions, Alra deodorant, and Radiaplex gel.  Reviewed areas of pertinence such as fatigue, hair loss, skin changes, breast tenderness, and breast swelling . Pt able to give teach back of to pat skin and use unscented/gentle soap,apply Radiaplex bid, avoid applying anything to skin within 4 hours of treatment, avoid wearing an under wire bra, and to use an electric razor if they must shave. Pt verbalizes understanding of information given and will contact nursing with any questions or concerns.     [email or web address]

## 2021-01-14 ENCOUNTER — Ambulatory Visit: Payer: Managed Care, Other (non HMO) | Admitting: Radiation Oncology

## 2021-01-14 ENCOUNTER — Ambulatory Visit: Payer: Managed Care, Other (non HMO)

## 2021-01-14 ENCOUNTER — Ambulatory Visit
Admission: RE | Admit: 2021-01-14 | Discharge: 2021-01-14 | Disposition: A | Discharge status: Home / Self Care | Payer: Managed Care, Other (non HMO) | Source: Ambulatory Visit | Attending: Radiation Oncology | Admitting: Radiation Oncology

## 2021-01-14 DIAGNOSIS — Z5112 Encounter for antineoplastic immunotherapy: Secondary | ICD-10-CM | POA: Diagnosis not present

## 2021-01-14 DIAGNOSIS — Z170 Estrogen receptor positive status [ER+]: Secondary | ICD-10-CM

## 2021-01-14 DIAGNOSIS — C50411 Malignant neoplasm of upper-outer quadrant of right female breast: Secondary | ICD-10-CM

## 2021-01-14 MED ORDER — RADIAPLEXRX EX GEL: Freq: Once | CUTANEOUS | Status: AC

## 2021-01-14 MED ORDER — ALRA NON-METALLIC DEODORANT (RAD-ONC)
1.0000 "application " | Freq: Once | TOPICAL | Status: AC
  Administered 2021-01-14: 1 via TOPICAL

## 2021-01-17 ENCOUNTER — Ambulatory Visit: Payer: Managed Care, Other (non HMO)

## 2021-01-17 ENCOUNTER — Ambulatory Visit
Admission: RE | Admit: 2021-01-17 | Discharge: 2021-01-17 | Disposition: A | Discharge status: Home / Self Care | Payer: Managed Care, Other (non HMO) | Source: Ambulatory Visit | Attending: Radiation Oncology | Admitting: Radiation Oncology

## 2021-01-17 ENCOUNTER — Other Ambulatory Visit: Payer: Self-pay

## 2021-01-17 DIAGNOSIS — Z5112 Encounter for antineoplastic immunotherapy: Secondary | ICD-10-CM | POA: Diagnosis not present

## 2021-01-18 ENCOUNTER — Ambulatory Visit: Payer: Managed Care, Other (non HMO)

## 2021-01-18 ENCOUNTER — Ambulatory Visit
Admission: RE | Admit: 2021-01-18 | Discharge: 2021-01-18 | Disposition: A | Discharge status: Home / Self Care | Payer: Managed Care, Other (non HMO) | Source: Ambulatory Visit | Attending: Radiation Oncology | Admitting: Radiation Oncology

## 2021-01-18 DIAGNOSIS — Z5112 Encounter for antineoplastic immunotherapy: Secondary | ICD-10-CM | POA: Diagnosis not present

## 2021-01-19 ENCOUNTER — Ambulatory Visit: Payer: Managed Care, Other (non HMO)

## 2021-01-19 ENCOUNTER — Other Ambulatory Visit: Payer: Self-pay

## 2021-01-19 ENCOUNTER — Ambulatory Visit
Admission: RE | Admit: 2021-01-19 | Discharge: 2021-01-19 | Disposition: A | Discharge status: Home / Self Care | Payer: Managed Care, Other (non HMO) | Source: Ambulatory Visit | Attending: Radiation Oncology | Admitting: Radiation Oncology

## 2021-01-19 DIAGNOSIS — Z5112 Encounter for antineoplastic immunotherapy: Secondary | ICD-10-CM | POA: Diagnosis not present

## 2021-01-20 ENCOUNTER — Ambulatory Visit
Admission: RE | Admit: 2021-01-20 | Discharge: 2021-01-20 | Disposition: A | Discharge status: Home / Self Care | Payer: Managed Care, Other (non HMO) | Source: Ambulatory Visit | Attending: Radiation Oncology | Admitting: Radiation Oncology

## 2021-01-20 ENCOUNTER — Ambulatory Visit: Payer: Managed Care, Other (non HMO)

## 2021-01-20 DIAGNOSIS — Z5112 Encounter for antineoplastic immunotherapy: Secondary | ICD-10-CM | POA: Diagnosis not present

## 2021-01-21 ENCOUNTER — Ambulatory Visit: Payer: Managed Care, Other (non HMO)

## 2021-01-21 ENCOUNTER — Inpatient Hospital Stay: Payer: Managed Care, Other (non HMO)

## 2021-01-21 ENCOUNTER — Other Ambulatory Visit: Payer: Self-pay

## 2021-01-21 ENCOUNTER — Ambulatory Visit: Payer: Managed Care, Other (non HMO) | Admitting: Radiation Oncology

## 2021-01-21 ENCOUNTER — Ambulatory Visit
Admission: RE | Admit: 2021-01-21 | Discharge: 2021-01-21 | Disposition: A | Discharge status: Home / Self Care | Payer: Managed Care, Other (non HMO) | Source: Ambulatory Visit | Attending: Radiation Oncology | Admitting: Radiation Oncology

## 2021-01-21 VITALS — BP 120/87 | HR 81 | Temp 98.6°F | Resp 16 | Wt 152.5 lb

## 2021-01-21 DIAGNOSIS — Z95828 Presence of other vascular implants and grafts: Secondary | ICD-10-CM

## 2021-01-21 DIAGNOSIS — C50411 Malignant neoplasm of upper-outer quadrant of right female breast: Secondary | ICD-10-CM

## 2021-01-21 DIAGNOSIS — Z5112 Encounter for antineoplastic immunotherapy: Secondary | ICD-10-CM | POA: Diagnosis not present

## 2021-01-21 LAB — CMP (CANCER CENTER ONLY)
ALT: 13 U/L (ref 0–44)
AST: 17 U/L (ref 15–41)
Albumin: 3.9 g/dL (ref 3.5–5.0)
Alkaline Phosphatase: 108 U/L (ref 38–126)
Anion gap: 9 (ref 5–15)
BUN: 12 mg/dL (ref 6–20)
CO2: 25 mmol/L (ref 22–32)
Calcium: 9.3 mg/dL (ref 8.9–10.3)
Chloride: 103 mmol/L (ref 98–111)
Creatinine: 0.79 mg/dL (ref 0.44–1.00)
GFR, Estimated: >60 mL/min (ref >60)
Glucose, Bld: 95 mg/dL (ref 70–99)
Potassium: 3.9 mmol/L (ref 3.5–5.1)
Sodium: 137 mmol/L (ref 135–145)
Total Bilirubin: 0.5 mg/dL (ref 0.3–1.2)
Total Protein: 7.6 g/dL (ref 6.5–8.1)

## 2021-01-21 LAB — CBC WITH DIFFERENTIAL/PLATELET
Abs Immature Granulocytes: 0.01 10*3/uL (ref 0.00–0.07)
Basophils Absolute: 0 10*3/uL (ref 0.0–0.1)
Basophils Relative: 0 %
Eosinophils Absolute: 0 10*3/uL (ref 0.0–0.5)
Eosinophils Relative: 1 %
HCT: 33.1 % — ABNORMAL LOW (ref 36.0–46.0)
Hemoglobin: 10.6 g/dL — ABNORMAL LOW (ref 12.0–15.0)
Immature Granulocytes: 0 %
Lymphocytes Relative: 20 %
Lymphs Abs: 1 10*3/uL (ref 0.7–4.0)
MCH: 26.6 pg (ref 26.0–34.0)
MCHC: 32 g/dL (ref 30.0–36.0)
MCV: 83 fL (ref 80.0–100.0)
Monocytes Absolute: 0.4 10*3/uL (ref 0.1–1.0)
Monocytes Relative: 8 %
Neutro Abs: 3.5 10*3/uL (ref 1.7–7.7)
Neutrophils Relative %: 71 %
Platelets: 241 10*3/uL (ref 150–400)
RBC: 3.99 MIL/uL (ref 3.87–5.11)
RDW: 13.9 % (ref 11.5–15.5)
WBC: 5 10*3/uL (ref 4.0–10.5)
nRBC: 0 % (ref 0.0–0.2)

## 2021-01-21 LAB — MAGNESIUM: Magnesium: 1.9 mg/dL (ref 1.7–2.4)

## 2021-01-21 MED ORDER — SODIUM CHLORIDE 0.9 % IV SOLN
420.0000 mg | Freq: Once | INTRAVENOUS | Status: AC
  Administered 2021-01-21: 420 mg via INTRAVENOUS
  Filled 2021-01-21: qty 14

## 2021-01-21 MED ORDER — SODIUM CHLORIDE 0.9% FLUSH
10.0000 mL | INTRAVENOUS | Status: DC | PRN
  Administered 2021-01-21: 10 mL

## 2021-01-21 MED ORDER — SODIUM CHLORIDE 0.9% FLUSH
10.0000 mL | Freq: Once | INTRAVENOUS | Status: AC
Start: 2021-01-21 — End: 2021-01-21
  Administered 2021-01-21: 10 mL

## 2021-01-21 MED ORDER — HEPARIN SOD (PORK) LOCK FLUSH 100 UNIT/ML IV SOLN
500.0000 [IU] | Freq: Once | INTRAVENOUS | Status: AC | PRN
  Administered 2021-01-21: 500 [IU]

## 2021-01-21 MED ORDER — TRASTUZUMAB-ANNS CHEMO 150 MG IV SOLR
420.0000 mg | Freq: Once | INTRAVENOUS | Status: AC
  Administered 2021-01-21: 420 mg via INTRAVENOUS
  Filled 2021-01-21: qty 20

## 2021-01-21 MED ORDER — SODIUM CHLORIDE 0.9 % IV SOLN: Freq: Once | INTRAVENOUS | Status: AC

## 2021-01-21 MED ORDER — ACETAMINOPHEN 325 MG PO TABS
650.0000 mg | ORAL_TABLET | Freq: Once | ORAL | Status: AC
  Administered 2021-01-21: 650 mg via ORAL
  Filled 2021-01-21: qty 2

## 2021-01-21 MED ORDER — DIPHENHYDRAMINE HCL 25 MG PO CAPS
25.0000 mg | ORAL_CAPSULE | Freq: Once | ORAL | Status: AC
  Administered 2021-01-21: 25 mg via ORAL
  Filled 2021-01-21: qty 1

## 2021-01-21 NOTE — Patient Instructions (Signed)
[facility]COL[facility]   Discharge Instructions: Thank you for choosing [facility] to provide your oncology and hematology care.   If you have a lab appointment with the [facility], please go directly to the [facility] and check in at the registration area.   Wear comfortable clothing and clothing appropriate for easy access to any Portacath or PICC line.   We strive to give you quality time with your provider. You may need to reschedule your appointment if you arrive late (15 or more minutes).  Arriving late affects you and other patients whose appointments are after yours.  Also, if you miss three or more appointments without notifying the office, you may be dismissed from the clinic at the provider's discretion.      For prescription refill requests, have your pharmacy contact our office and allow 72 hours for refills to be completed.    Today you received the following chemotherapy and/or immunotherapy agents: Trastuzumab (Herceptin) and Pertuzumab (Perjeta)      To help prevent nausea and vomiting after your treatment, we encourage you to take your nausea medication as directed.  BELOW ARE SYMPTOMS THAT SHOULD BE REPORTED IMMEDIATELY: *FEVER GREATER THAN 100.4 F (38 C) OR HIGHER *CHILLS OR SWEATING *NAUSEA AND VOMITING THAT IS NOT CONTROLLED WITH YOUR NAUSEA MEDICATION *UNUSUAL SHORTNESS OF BREATH *UNUSUAL BRUISING OR BLEEDING *URINARY PROBLEMS (pain or burning when urinating, or frequent urination) *BOWEL PROBLEMS (unusual diarrhea, constipation, pain near the anus) TENDERNESS IN MOUTH AND THROAT WITH OR WITHOUT PRESENCE OF ULCERS (sore throat, sores in mouth, or a toothache) UNUSUAL RASH, SWELLING OR PAIN  UNUSUAL VAGINAL DISCHARGE OR ITCHING   Items with * indicate a potential emergency and should be followed up as soon as possible or go to the Emergency Department if any problems should occur.  Please show the CHEMOTHERAPY ALERT CARD or  IMMUNOTHERAPY ALERT CARD at check-in to the Emergency Department and triage nurse.  Should you have questions after your visit or need to cancel or reschedule your appointment, please contact [facility]  Dept: [phone]  and follow the prompts.  Office hours are 8:00 a.m. to 4:30 p.m. Monday - Friday. Please note that voicemails left after 4:00 p.m. may not be returned until the following business day.  We are closed weekends and major holidays. You have access to a nurse at all times for urgent questions. Please call the main number to the clinic Dept: [phone] and follow the prompts.   For any non-urgent questions, you may also contact your provider using MyChart. We now offer e-Visits for anyone 60 and older to request care online for non-urgent symptoms. For details visit [email or web address].   Also download the MyChart app! Go to the app store, search "MyChart", open the app, select [facility], and log in with your MyChart username and password.  Due to Covid, a mask is required upon entering the hospital/clinic. If you do not have a mask, one will be given to you upon arrival. For doctor visits, patients may have 1 support person aged 17 or older with them. For treatment visits, patients cannot have anyone with them due to current Covid guidelines and our immunocompromised population.

## 2021-01-24 ENCOUNTER — Ambulatory Visit
Admission: RE | Admit: 2021-01-24 | Discharge: 2021-01-24 | Disposition: A | Discharge status: Home / Self Care | Payer: Managed Care, Other (non HMO) | Source: Ambulatory Visit | Attending: Radiation Oncology | Admitting: Radiation Oncology

## 2021-01-24 ENCOUNTER — Other Ambulatory Visit: Payer: Self-pay

## 2021-01-24 ENCOUNTER — Ambulatory Visit: Payer: Managed Care, Other (non HMO)

## 2021-01-24 DIAGNOSIS — Z5112 Encounter for antineoplastic immunotherapy: Secondary | ICD-10-CM | POA: Diagnosis not present

## 2021-01-25 ENCOUNTER — Ambulatory Visit
Admission: RE | Admit: 2021-01-25 | Discharge: 2021-01-25 | Disposition: A | Discharge status: Home / Self Care | Payer: Managed Care, Other (non HMO) | Source: Ambulatory Visit | Attending: Radiation Oncology | Admitting: Radiation Oncology

## 2021-01-25 ENCOUNTER — Encounter: Payer: Self-pay | Admitting: Hematology and Oncology

## 2021-01-25 ENCOUNTER — Ambulatory Visit: Payer: Managed Care, Other (non HMO)

## 2021-01-25 DIAGNOSIS — Z5112 Encounter for antineoplastic immunotherapy: Secondary | ICD-10-CM | POA: Diagnosis not present

## 2021-01-26 ENCOUNTER — Observation Stay (HOSPITAL_COMMUNITY)
Admission: EM | Admit: 2021-01-26 | Discharge: 2021-01-28 | Disposition: A | Discharge status: Home / Self Care | Payer: Managed Care, Other (non HMO) | Attending: General Surgery | Admitting: General Surgery

## 2021-01-26 ENCOUNTER — Other Ambulatory Visit: Payer: Self-pay

## 2021-01-26 ENCOUNTER — Observation Stay (HOSPITAL_COMMUNITY): Payer: Managed Care, Other (non HMO)

## 2021-01-26 ENCOUNTER — Encounter: Payer: Self-pay | Admitting: Radiation Oncology

## 2021-01-26 ENCOUNTER — Emergency Department (HOSPITAL_COMMUNITY): Payer: Managed Care, Other (non HMO)

## 2021-01-26 ENCOUNTER — Ambulatory Visit: Payer: Managed Care, Other (non HMO)

## 2021-01-26 ENCOUNTER — Encounter (HOSPITAL_COMMUNITY): Payer: Self-pay | Admitting: Oncology

## 2021-01-26 DIAGNOSIS — Z980 Intestinal bypass and anastomosis status: Secondary | ICD-10-CM | POA: Insufficient documentation

## 2021-01-26 DIAGNOSIS — K56609 Unspecified intestinal obstruction, unspecified as to partial versus complete obstruction: Secondary | ICD-10-CM

## 2021-01-26 DIAGNOSIS — K6289 Other specified diseases of anus and rectum: Secondary | ICD-10-CM | POA: Diagnosis not present

## 2021-01-26 DIAGNOSIS — K50819 Crohn's disease of both small and large intestine with unspecified complications: Secondary | ICD-10-CM

## 2021-01-26 DIAGNOSIS — Z20822 Contact with and (suspected) exposure to covid-19: Secondary | ICD-10-CM | POA: Insufficient documentation

## 2021-01-26 DIAGNOSIS — Z853 Personal history of malignant neoplasm of breast: Secondary | ICD-10-CM | POA: Diagnosis not present

## 2021-01-26 DIAGNOSIS — Z8719 Personal history of other diseases of the digestive system: Secondary | ICD-10-CM

## 2021-01-26 DIAGNOSIS — K5090 Crohn's disease, unspecified, without complications: Secondary | ICD-10-CM | POA: Insufficient documentation

## 2021-01-26 DIAGNOSIS — Z0189 Encounter for other specified special examinations: Secondary | ICD-10-CM

## 2021-01-26 DIAGNOSIS — Z87891 Personal history of nicotine dependence: Secondary | ICD-10-CM | POA: Insufficient documentation

## 2021-01-26 DIAGNOSIS — R109 Unspecified abdominal pain: Secondary | ICD-10-CM | POA: Diagnosis present

## 2021-01-26 DIAGNOSIS — K50812 Crohn's disease of both small and large intestine with intestinal obstruction: Secondary | ICD-10-CM

## 2021-01-26 DIAGNOSIS — K56601 Complete intestinal obstruction, unspecified as to cause: Secondary | ICD-10-CM | POA: Diagnosis not present

## 2021-01-26 LAB — CBC
HCT: 41.7 % (ref 36.0–46.0)
Hemoglobin: 13.3 g/dL (ref 12.0–15.0)
MCH: 26.5 pg (ref 26.0–34.0)
MCHC: 31.9 g/dL (ref 30.0–36.0)
MCV: 83.2 fL (ref 80.0–100.0)
Platelets: 312 10*3/uL (ref 150–400)
RBC: 5.01 MIL/uL (ref 3.87–5.11)
RDW: 14.4 % (ref 11.5–15.5)
WBC: 10.5 10*3/uL (ref 4.0–10.5)
nRBC: 0 % (ref 0.0–0.2)

## 2021-01-26 LAB — COMPREHENSIVE METABOLIC PANEL
ALT: 16 U/L (ref 0–44)
AST: 25 U/L (ref 15–41)
Albumin: 4.7 g/dL (ref 3.5–5.0)
Alkaline Phosphatase: 134 U/L — ABNORMAL HIGH (ref 38–126)
Anion gap: 11 (ref 5–15)
BUN: 21 mg/dL — ABNORMAL HIGH (ref 6–20)
CO2: 25 mmol/L (ref 22–32)
Calcium: 9.7 mg/dL (ref 8.9–10.3)
Chloride: 100 mmol/L (ref 98–111)
Creatinine, Ser: 1.03 mg/dL — ABNORMAL HIGH (ref 0.44–1.00)
GFR, Estimated: >60 mL/min (ref >60)
Glucose, Bld: 122 mg/dL — ABNORMAL HIGH (ref 70–99)
Potassium: 3.9 mmol/L (ref 3.5–5.1)
Sodium: 136 mmol/L (ref 135–145)
Total Bilirubin: 1.1 mg/dL (ref 0.3–1.2)
Total Protein: 8.9 g/dL — ABNORMAL HIGH (ref 6.5–8.1)

## 2021-01-26 LAB — URINALYSIS, ROUTINE W REFLEX MICROSCOPIC
Bilirubin Urine: NEGATIVE
Glucose, UA: NEGATIVE mg/dL
Hgb urine dipstick: NEGATIVE
Ketones, ur: NEGATIVE mg/dL
Leukocytes,Ua: NEGATIVE
Nitrite: NEGATIVE
Protein, ur: 30 mg/dL — AB
Specific Gravity, Urine: 1.03 (ref 1.005–1.030)
pH: 5 (ref 5.0–8.0)

## 2021-01-26 LAB — RESP PANEL BY RT-PCR (FLU A&B, COVID) ARPGX2
Influenza A by PCR: NEGATIVE
Influenza B by PCR: NEGATIVE
SARS Coronavirus 2 by RT PCR: NEGATIVE

## 2021-01-26 LAB — I-STAT BETA HCG BLOOD, ED (MC, WL, AP ONLY): I-stat hCG, quantitative: <5 m[IU]/mL (ref <5)

## 2021-01-26 LAB — LIPASE, BLOOD: Lipase: 47 U/L (ref 11–51)

## 2021-01-26 LAB — LACTIC ACID, PLASMA: Lactic Acid, Venous: 1.5 mmol/L (ref 0.5–1.9)

## 2021-01-26 MED ORDER — MORPHINE SULFATE (PF) 4 MG/ML IV SOLN
4.0000 mg | Freq: Once | INTRAVENOUS | Status: AC
  Administered 2021-01-26: 4 mg via INTRAVENOUS
  Filled 2021-01-26: qty 1

## 2021-01-26 MED ORDER — IOHEXOL 350 MG/ML SOLN
80.0000 mL | Freq: Once | INTRAVENOUS | Status: AC | PRN
  Administered 2021-01-26: 80 mL via INTRAVENOUS

## 2021-01-26 MED ORDER — SODIUM CHLORIDE 0.9 % IV SOLN: INTRAVENOUS | Status: DC

## 2021-01-26 MED ORDER — DIATRIZOATE MEGLUMINE & SODIUM 66-10 % PO SOLN
90.0000 mL | Freq: Once | ORAL | Status: AC
  Administered 2021-01-26: 90 mL via NASOGASTRIC
  Filled 2021-01-26: qty 90

## 2021-01-26 MED ORDER — HYDROMORPHONE HCL 1 MG/ML IJ SOLN
0.5000 mg | Freq: Once | INTRAMUSCULAR | Status: DC
Start: 2021-01-26 — End: 2021-01-28
  Filled 2021-01-26: qty 1

## 2021-01-26 MED ORDER — MORPHINE SULFATE (PF) 2 MG/ML IV SOLN: 2.0000 mg | INTRAVENOUS | Status: DC | PRN

## 2021-01-26 MED ORDER — ACETAMINOPHEN 650 MG RE SUPP: 650.0000 mg | Freq: Four times a day (QID) | RECTAL | Status: DC | PRN

## 2021-01-26 MED ORDER — PHENOL 1.4 % MT LIQD
1.0000 | OROMUCOSAL | Status: DC | PRN
  Administered 2021-01-26: 1 via OROMUCOSAL
  Filled 2021-01-26: qty 177

## 2021-01-26 MED ORDER — METOPROLOL TARTRATE 5 MG/5ML IV SOLN: 5.0000 mg | Freq: Four times a day (QID) | INTRAVENOUS | Status: DC | PRN

## 2021-01-26 MED ORDER — ONDANSETRON HCL 4 MG/2ML IJ SOLN
4.0000 mg | Freq: Once | INTRAMUSCULAR | Status: AC
  Administered 2021-01-26: 4 mg via INTRAVENOUS
  Filled 2021-01-26: qty 2

## 2021-01-26 MED ORDER — SODIUM CHLORIDE (PF) 0.9 % IJ SOLN
INTRAMUSCULAR | Status: AC
  Filled 2021-01-26: qty 50

## 2021-01-26 MED ORDER — SODIUM CHLORIDE 0.9 % IV BOLUS
1000.0000 mL | Freq: Once | INTRAVENOUS | Status: AC
Start: 2021-01-26 — End: 2021-01-26
  Administered 2021-01-26: 1000 mL via INTRAVENOUS

## 2021-01-26 MED ORDER — ACETAMINOPHEN 325 MG PO TABS: 650.0000 mg | ORAL_TABLET | Freq: Four times a day (QID) | ORAL | Status: DC | PRN

## 2021-01-26 MED ORDER — ONDANSETRON HCL 4 MG/2ML IJ SOLN: 4.0000 mg | Freq: Four times a day (QID) | INTRAMUSCULAR | Status: DC | PRN

## 2021-01-26 MED ORDER — ENOXAPARIN SODIUM 40 MG/0.4ML IJ SOSY
40.0000 mg | PREFILLED_SYRINGE | Freq: Every day | INTRAMUSCULAR | Status: DC
  Filled 2021-01-26 (×2): qty 0.4

## 2021-01-26 MED ORDER — ONDANSETRON 4 MG PO TBDP: 4.0000 mg | ORAL_TABLET | Freq: Four times a day (QID) | ORAL | Status: DC | PRN

## 2021-01-26 NOTE — ED Notes (Addendum)
Confirmed radiology read and placement via Dr. Ria Comment written report. Placement verified and no additional recommendations for placement at this time. NG tube re-started to low intermittent suction at this time.

## 2021-01-26 NOTE — ED Notes (Signed)
Pt back from CT at this time 

## 2021-01-26 NOTE — ED Notes (Signed)
Report sent to floor.

## 2021-01-26 NOTE — ED Notes (Signed)
Radiology notified by this nurse regarding pt's STAT KUB. Xray aware and en route.

## 2021-01-26 NOTE — ED Notes (Signed)
NG tube placed to right nostril with gastric stomach contents returned. NG tube set to low intermittent suction. Pt tolerating well. Pt denies any complaints or concerns at this time. Stat KUB ordered.

## 2021-01-26 NOTE — ED Notes (Signed)
Xray at the bedside.

## 2021-01-26 NOTE — ED Notes (Signed)
Pt's intermittent suction paused until films read and placement confirmed by Radiologist.

## 2021-01-26 NOTE — H&P (Signed)
[facility] Surgery Admission Note  [name] [birth year]  [id number].    Requesting MD: Daleen Bo Chief Complaint/Reason for Consult: SBO  HPI:  [name] is a 45yo female PMH breast cancer s/p bilateral nipple sparing mastectomy 11/09/20 by [name] and currently on immunosuppressant therapy and undergoing radiation treatment, as well as a h/o Crohns disease s/p robotic assisted subtotal colectomy with ileo-sigmoid anastomosis 06/27/19 by Dr. Marcello Moores, who presented to [facility] today complaining of abdominal pain. States that the pain started yesterday around 4am. It is diffuse and constant. Associated with multiple episodes of nausea and vomiting. Unable to tolerate PO. She had a normal BM yesterday afternoon, and has passed some flatus today. Denies prior h/o SBO.  In the ED she underwent CT scan which shows a high-grade distal small bowel obstruction with transition point in the region of intussusception at the ileocolic anastomosis. WBC and lactic acid WNL. She is mildly tachycardic in the low 100s and hypertensive. General surgery asked to see.  Anticoagulants: none  Review of Systems  Constitutional: Negative.   Respiratory: Negative.    Cardiovascular: Negative.   Gastrointestinal:  Positive for abdominal pain, nausea and vomiting.   All systems reviewed and otherwise negative except for as above  Family History  Problem Relation Age of Onset   Breast cancer Maternal Aunt        dx unknown age   Diabetes Father    Diabetes Paternal Aunt    Breast cancer Cousin        maternal cousin; dx unknown age   Esophageal cancer Neg Hx    Rectal cancer Neg Hx    Stomach cancer Neg Hx    Colon polyps Neg Hx    Colon cancer Neg Hx     Past Medical History:  Diagnosis Date   Cancer ([place])    Colon stricture ([place]) 05/07/2019   Crohn's disease ([place])    Family history of breast cancer 06/11/2020   Iron deficiency anemia secondary to blood loss (chronic) -  Crohn's colitis 10/17/2010   Rectal bleeding    Uterine fibroid    Vitamin D deficiency 09/24/2013    Past Surgical History:  Procedure Laterality Date   AXILLARY LYMPH NODE DISSECTION Right 11/09/2020   Procedure: RIGHT AXILLARY LYMPH NODE DISSECTION;  Surgeon: [name], MD;  Location: [place];  Service: General;  Laterality: Right;   BIOPSY  07/01/2020   Procedure: BIOPSY;  Surgeon: Yetta Flock, MD;  Location: [facility];  Service: Gastroenterology;;   BREAST RECONSTRUCTION WITH PLACEMENT OF TISSUE EXPANDER AND FLEX HD (ACELLULAR HYDRATED DERMIS) Bilateral 11/09/2020   Procedure: BILATERAL BREAST RECONSTRUCTION WITH PLACEMENT OF TISSUE EXPANDER AND FLEX HD (ACELLULAR HYDRATED DERMIS);  Surgeon: [name], MD;  Location: [place];  Service: Plastics;  Laterality: Bilateral;   COLECTOMY  06/27/2019   COLONOSCOPY  2015   FLEXIBLE SIGMOIDOSCOPY N/A 07/01/2020   Procedure: FLEXIBLE SIGMOIDOSCOPY;  Surgeon: Yetta Flock, MD;  Location: [facility];  Service: Gastroenterology;  Laterality: N/A;   NIPPLE SPARING MASTECTOMY Bilateral 11/09/2020   Procedure: BILATERAL NIPPLE SPARING MASTECTOMY;  Surgeon: [name], MD;  Location: [place];  Service: General;  Laterality: Bilateral;   PORTACATH PLACEMENT N/A 06/23/2020   Procedure: INSERTION PORT-A-CATH;  Surgeon: [name], MD;  Location: [place];  Service: General;  Laterality: N/A;  START TIME OF 11:00 AM FOR 60 MINUTES [place] [facility]    Social History:  reports that  she has quit smoking. Her smoking use included cigarettes. She has never used smokeless tobacco. She reports current alcohol use of about 1.0 - 2.0 standard drink per week. She reports that she does not use drugs.  Allergies:  Allergies  Allergen Reactions   Other     BLOOD PRODUCT REFUSAL    (Not in a hospital admission)   Prior to Admission medications    Medication Sig Start Date End Date Taking? Authorizing Provider  acetaminophen (TYLENOL) 500 MG tablet Take 500-1,000 mg by mouth every 6 (six) hours as needed for mild pain.   Yes [provider]  lidocaine-prilocaine (EMLA) cream Apply 1 application topically as needed (Prior to port access). 06/16/20  Yes Benay Pike, MD  mesalamine (LIALDA) 1.2 g EC tablet Take 2 tablets (2.4 g total) by mouth 2 (two) times daily with a meal. 07/22/20  Yes [name], MD  OVER THE COUNTER MEDICATION Take 2 tablets by mouth daily.   Yes [provider]  prochlorperazine (COMPAZINE) 10 MG tablet Take 1 tablet (10 mg total) by mouth every 6 (six) hours as needed (Nausea or vomiting). 06/16/20 11/19/20  Benay Pike, MD    Blood pressure (!) 151/103, pulse 100, temperature 98.7 F (37.1 C), temperature source Oral, resp. rate 19, height 5' 4"  (1.626 m), weight 68.9 kg, SpO2 100 %. Physical Exam: General: pleasant, WD/WN female who is laying in bed in NAD HEENT: head is normocephalic, atraumatic.  Sclera are noninjected.  Pupils equal and round.  Ears and nose without any masses or lesions.  Mouth is pink and moist. Dentition fair Heart: regular, rate, and rhythm.  Normal s1,s2. No obvious murmurs, gallops, or rubs noted.  Palpable pedal pulses bilaterally  Lungs: CTAB, no wheezes, rhonchi, or rales noted.  Respiratory effort nonlabored Abd: well healed midline incision, soft, mild distension, minimal diffuse TTP without rebound or guarding, hypoactive BS, no masses, hernias, or organomegaly MS: no BUE/BLE edema, calves soft and nontender Skin: warm and dry with no masses, lesions, or rashes Psych: A&Ox4 with an appropriate affect Neuro: cranial nerves grossly intact, equal strength in BUE/BLE bilaterally, normal speech, thought process intact  Results for orders placed or performed during the hospital encounter of 01/26/21 (from the past 48 hour(s))  Lipase, blood     Status: None    Collection Time: 01/26/21  7:34 AM  Result Value Ref Range   Lipase 47 11 - 51 U/L    Comment: Performed at [facility], [place] [address], [place],  [place] [address]  Comprehensive metabolic panel     Status: Abnormal   Collection Time: 01/26/21  7:34 AM  Result Value Ref Range   Sodium 136 135 - 145 mmol/L   Potassium 3.9 3.5 - 5.1 mmol/L   Chloride 100 98 - 111 mmol/L   CO2 25 22 - 32 mmol/L   Glucose, Bld 122 (H) 70 - 99 mg/dL    Comment: Glucose reference range applies only to samples taken after fasting for at least 8 hours.   BUN 21 (H) 6 - 20 mg/dL   Creatinine, Ser 1.03 (H) 0.44 - 1.00 mg/dL   Calcium 9.7 8.9 - 10.3 mg/dL   Total Protein 8.9 (H) 6.5 - 8.1 g/dL   Albumin 4.7 3.5 - 5.0 g/dL   AST 25 15 - 41 U/L   ALT 16 0 - 44 U/L   Alkaline Phosphatase 134 (H) 38 - 126 U/L   Total Bilirubin 1.1 0.3 - 1.2 mg/dL  GFR, Estimated >60 >60 mL/min    Comment: (NOTE) Calculated using the CKD-EPI Creatinine Equation (2021)    Anion gap 11 5 - 15    Comment: Performed at [facility], [place] [address] [place], [place] [address]  CBC     Status: None   Collection Time: 01/26/21  7:34 AM  Result Value Ref Range   WBC 10.5 4.0 - 10.5 K/uL   RBC 5.01 3.87 - 5.11 MIL/uL   Hemoglobin 13.3 12.0 - 15.0 g/dL   HCT 41.7 36.0 - 46.0 %   MCV 83.2 80.0 - 100.0 fL   MCH 26.5 26.0 - 34.0 pg   MCHC 31.9 30.0 - 36.0 g/dL   RDW 14.4 11.5 - 15.5 %   Platelets 312 150 - 400 K/uL   nRBC 0.0 0.0 - 0.2 %    Comment: Performed at [facility], [place] [address], [place], [place] [address]  Urinalysis, Routine w reflex microscopic     Status: Abnormal   Collection Time: 01/26/21  7:40 AM  Result Value Ref Range   Color, Urine YELLOW YELLOW   APPearance HAZY (A) CLEAR   Specific Gravity, Urine 1.030 1.005 - 1.030   pH 5.0 5.0 - 8.0   Glucose, UA NEGATIVE NEGATIVE mg/dL   Hgb urine dipstick NEGATIVE NEGATIVE   Bilirubin Urine NEGATIVE NEGATIVE    Ketones, ur NEGATIVE NEGATIVE mg/dL   Protein, ur 30 (A) NEGATIVE mg/dL   Nitrite NEGATIVE NEGATIVE   Leukocytes,Ua NEGATIVE NEGATIVE   RBC / HPF 0-5 0 - 5 RBC/hpf   WBC, UA 0-5 0 - 5 WBC/hpf   Bacteria, UA FEW (A) NONE SEEN   Squamous Epithelial / LPF 0-5 0 - 5   Mucus PRESENT    Hyaline Casts, UA PRESENT     Comment: Performed at [facility], [place] [address], [place],  [address]  I-Stat beta hCG blood, ED     Status: None   Collection Time: 01/26/21  8:10 AM  Result Value Ref Range   I-stat hCG, quantitative <5.0 <5 mIU/mL   Comment 3            Comment:   GEST. AGE      CONC.  (mIU/mL)   <=1 WEEK        5 - 50     2 WEEKS       50 - 500     3 WEEKS       100 - 10,000     4 WEEKS     1,000 - 30,000        FEMALE AND NON-PREGNANT FEMALE:     LESS THAN 5 mIU/mL   Resp Panel by RT-PCR (Flu A&B, Covid) Nasopharyngeal Swab     Status: None   Collection Time: 01/26/21  9:31 AM   Specimen: Nasopharyngeal Swab; Nasopharyngeal(NP) swabs in vial transport medium  Result Value Ref Range   SARS Coronavirus 2 by RT PCR NEGATIVE NEGATIVE    Comment: (NOTE) SARS-CoV-2 target nucleic acids are NOT DETECTED.  The SARS-CoV-2 RNA is generally detectable in upper respiratory specimens during the acute phase of infection. The lowest concentration of SARS-CoV-2 viral copies this assay can detect is 138 copies/mL. A negative result does not preclude SARS-Cov-2 infection and should not be used as the sole basis for treatment or other patient management decisions. A negative result may occur with  improper specimen collection/handling, submission of specimen other than nasopharyngeal swab, presence of  viral mutation(s) within the areas targeted by this assay, and inadequate number of viral copies(<138 copies/mL). A negative result must be combined with clinical observations, patient history, and epidemiological information. The expected result is Negative.  Fact  Sheet for Patients:  EntrepreneurPulse.com.au  Fact Sheet for Healthcare Providers:  IncredibleEmployment.be  This test is no t yet approved or cleared by the [place] FDA and  has been authorized for detection and/or diagnosis of SARS-CoV-2 by FDA under an Emergency Use Authorization (EUA). This EUA will remain  in effect (meaning this test can be used) for the duration of the COVID-19 declaration under Section 564(b)(1) of the Act, 21 U.S.C.section 360bbb-3(b)(1), unless the authorization is terminated  or revoked sooner.       Influenza A by PCR NEGATIVE NEGATIVE   Influenza B by PCR NEGATIVE NEGATIVE    Comment: (NOTE) The Xpert Xpress SARS-CoV-2/FLU/RSV plus assay is intended as an aid in the diagnosis of influenza from Nasopharyngeal swab specimens and should not be used as a sole basis for treatment. Nasal washings and aspirates are unacceptable for Xpert Xpress SARS-CoV-2/FLU/RSV testing.  Fact Sheet for Patients: EntrepreneurPulse.com.au  Fact Sheet for Healthcare Providers: IncredibleEmployment.be  This test is not yet approved or cleared by the [place] FDA and has been authorized for detection and/or diagnosis of SARS-CoV-2 by FDA under an Emergency Use Authorization (EUA). This EUA will remain in effect (meaning this test can be used) for the duration of the COVID-19 declaration under Section 564(b)(1) of the Act, 21 U.S.C. section 360bbb-3(b)(1), unless the authorization is terminated or revoked.  Performed at [facility], [place] [address], [place], [place] [address]   Lactic acid, plasma     Status: None   Collection Time: 01/26/21  9:31 AM  Result Value Ref Range   Lactic Acid, Venous 1.5 0.5 - 1.9 mmol/L    Comment: Performed at [facility], [place] [address] [place], [place] [address]   CT Abdomen Pelvis W Contrast  Result Date:  01/26/2021 CLINICAL DATA:  Abdominal pain EXAM: CT ABDOMEN AND PELVIS WITH CONTRAST TECHNIQUE: Multidetector CT imaging of the abdomen and pelvis was performed using the standard protocol following bolus administration of intravenous contrast. CONTRAST:  64m OMNIPAQUE IOHEXOL 350 MG/ML SOLN COMPARISON:  06/30/2020 FINDINGS: Lower chest: Bilateral breast implants. No pleural or pericardial effusion. Visualized lung bases clear. Hepatobiliary: No focal liver abnormality is seen. No gallstones, gallbladder wall thickening, or biliary dilatation. Pancreas: Unremarkable. No pancreatic ductal dilatation or surrounding inflammatory changes. Spleen: Normal in size without focal abnormality. Adrenals/Urinary Tract: Adrenal glands are unremarkable. Kidneys are normal, without renal calculi, focal lesion, or hydronephrosis. Bladder is unremarkable. Stomach/Bowel: Stomach is partially distended, unremarkable. Proximal loops of small bowel are nondilated. There are multiple dilated distal small bowel loops. Transition point is in the region of intussusception involving the ileocolic anastomosis. Distal sigmoid colon and rectum are decompressed. Vascular/Lymphatic: No significant vascular findings are present. No enlarged abdominal or pelvic lymph nodes. Reproductive: Enlarged uterus with peripherally calcified left fundal fibroid. No adnexal mass. Other: No ascites.  No free air. Musculoskeletal: No acute or significant osseous findings. IMPRESSION: 1. High-grade distal small bowel obstruction, transition point in the region of intussusception at the ileocolic anastomosis. Electronically Signed   By: DLucrezia EuropeM.D.   On: 01/26/2021 09:49      Assessment/Plan SBO - PSH robotic assisted subtotal colectomy with ileo-sigmoid anastomosis 06/27/19 by Dr. TMarcello Moores- CT scan today shows a high-grade distal small bowel  obstruction with transition point in the region of intussusception at the ileocolic anastomosis - her WBC and  lactic acid are WNL and she is minimally tender on abdominal exam with no signs of peritonitis. No indication for urgent surgical intervention. - Will admit to med-surg for observation and start the small bowel obstruction protocol with NG tube for decompression, gastrograffin administration and delayed abdominal film.  ID - none VTE - lovenox FEN - IVF, NPO/NGT to LIWS Foley - none  Breast cancer s/p bilateral nipple sparing mastectomy 11/09/20 by [name], currently on immunosuppressant therapy and undergoing radiation treatment  Crohns disease - on mesalamine  Wellington Hampshire, [facility] Surgery 01/26/2021, 11:07 AM Please see Amion for pager number during day hours 7:00am-4:30pm

## 2021-01-26 NOTE — ED Triage Notes (Signed)
Pt reports abdominal pain since radiation on Monday.  Pain became severe yesterday accompanied by N/V.  Reports poor po intake.

## 2021-01-26 NOTE — Consult Note (Signed)
Referring Provider: [facility], Dr. Marcello Moores Primary Care Physician:  Patient, [name] (Inactive) Primary Gastroenterologist:  Dr. Carlean Purl  Reason for Consultation:  SBO, Crohn's disease  HPI: [name] is a 45 y.o. female PMH breast cancer s/p bilateral nipple sparing mastectomy 11/09/20 by [name] and currently on immunosuppressant therapy and undergoing radiation treatment, h/o Crohns disease s/p robotic assisted subtotal colectomy with ileo-sigmoid anastomosis 06/27/19 by Dr. Marcello Moores maintained on Lialda 4.8 grams daily, who presented to [facility] today complaining of abdominal pain. States that the pain started yesterday around 4am. It is diffuse and constant. Associated with multiple episodes of nausea and vomiting. Unable to tolerate PO. She had a normal BM yesterday morning shortly after all of this began but none since.  Tells me that she is NOT passing any flatus.  Labs look fine except for very small bump in her creatinine, likely reflective of some dehydration.  NGT is in place.  CT scan of the abdomen and pelvis with contrast showed the following: IMPRESSION: 1. High-grade distal small bowel obstruction, transition point in the region of intussusception at the ileocolic anastomosis.   Flex sig 06/2020:  - The terminal ileum is normal. - The colonic anastomosis is normal. - Diffuse inflammation was found in the distal rectum. Biopsied. - Normal colon otherwise Differential diagnosis includes drug induced colitis (Docetaxil), Crohn's disease, vs infectious. I think drug reaction is most likely. It would be unusual to have a Crohn's flare in the setting of chemotherapy and her surgical anastomosis and ileum, looked normal.  A. RECTUM, BIOPSY:  - Moderately active chronic colitis, see comment.  - No granulomas.  - No dysplasia or malignancy.   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   Crohn's disease ([place])    Family  history of breast cancer 06/11/2020   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 10/17/2010   Rectal bleeding    Uterine fibroid    Vitamin D deficiency 09/24/2013    Past Surgical History:  Procedure Laterality Date   AXILLARY LYMPH NODE DISSECTION Right 11/09/2020   Procedure: RIGHT AXILLARY LYMPH NODE DISSECTION;  Surgeon: [name], MD;  Location: [place];  Service: General;  Laterality: Right;   BIOPSY  07/01/2020   Procedure: BIOPSY;  Surgeon: Yetta Flock, MD;  Location: [facility];  Service: Gastroenterology;;   BREAST RECONSTRUCTION WITH PLACEMENT OF TISSUE EXPANDER AND FLEX HD (ACELLULAR HYDRATED DERMIS) Bilateral 11/09/2020   Procedure: BILATERAL BREAST RECONSTRUCTION WITH PLACEMENT OF TISSUE EXPANDER AND FLEX HD (ACELLULAR HYDRATED DERMIS);  Surgeon: [name], MD;  Location: [place];  Service: Plastics;  Laterality: Bilateral;   COLECTOMY  06/27/2019   COLONOSCOPY  2015   FLEXIBLE SIGMOIDOSCOPY N/A 07/01/2020   Procedure: FLEXIBLE SIGMOIDOSCOPY;  Surgeon: Yetta Flock, MD;  Location: [facility];  Service: Gastroenterology;  Laterality: N/A;   NIPPLE SPARING MASTECTOMY Bilateral 11/09/2020   Procedure: BILATERAL NIPPLE SPARING MASTECTOMY;  Surgeon: [name], MD;  Location: [place];  Service: General;  Laterality: Bilateral;   PORTACATH PLACEMENT N/A 06/23/2020   Procedure: INSERTION PORT-A-CATH;  Surgeon: [name], MD;  Location: [place];  Service: General;  Laterality: N/A;  START TIME OF 11:00 AM FOR 40 MINUTES [place] [facility]    Prior to Admission medications   Medication Sig Start Date End Date Taking? Authorizing Provider  acetaminophen (TYLENOL) 500 MG tablet Take  500-1,000 mg by mouth every 6 (six) hours as needed for mild pain.   Yes [provider]  lidocaine-prilocaine (EMLA) cream Apply 1 application topically as  needed (Prior to port access). 06/16/20  Yes Benay Pike, MD  mesalamine (LIALDA) 1.2 g EC tablet Take 2 tablets (2.4 g total) by mouth 2 (two) times daily with a meal. 07/22/20  Yes [name], MD  OVER THE COUNTER MEDICATION Take 2 tablets by mouth daily.   Yes [provider]  prochlorperazine (COMPAZINE) 10 MG tablet Take 1 tablet (10 mg total) by mouth every 6 (six) hours as needed (Nausea or vomiting). 06/16/20 11/19/20  Benay Pike, MD    Current Facility-Administered Medications  Medication Dose Route Frequency Provider Last Rate Last Admin   0.9 %  sodium chloride infusion   Intravenous Continuous Jillyn Ledger, PA-C       acetaminophen (TYLENOL) tablet 650 mg  650 mg Oral Q6H PRN Jillyn Ledger, PA-C       Or   acetaminophen (TYLENOL) suppository 650 mg  650 mg Rectal Q6H PRN Maczis, Barth Kirks, PA-C       diatrizoate meglumine-sodium (GASTROGRAFIN) 66-10 % solution 90 mL  90 mL Per NG tube Once Meuth, Brooke A, PA-C       enoxaparin (LOVENOX) injection 40 mg  40 mg Subcutaneous Daily Maczis, Michael M, PA-C       HYDROmorphone (DILAUDID) injection 0.5 mg  0.5 mg Intravenous Once Henderly, Britni A, PA-C       metoprolol tartrate (LOPRESSOR) injection 5 mg  5 mg Intravenous Q6H PRN Maczis, Barth Kirks, PA-C       morphine 2 MG/ML injection 2 mg  2 mg Intravenous Q2H PRN Jillyn Ledger, PA-C       ondansetron (ZOFRAN-ODT) disintegrating tablet 4 mg  4 mg Oral Q6H PRN Jillyn Ledger, PA-C       Or   ondansetron [facility]) injection 4 mg  4 mg Intravenous Q6H PRN Maczis, Michael M, PA-C       sodium chloride (PF) 0.9 % injection            Current Outpatient Medications  Medication Sig Dispense Refill   acetaminophen (TYLENOL) 500 MG tablet Take 500-1,000 mg by mouth every 6 (six) hours as needed for mild pain.     lidocaine-prilocaine (EMLA) cream Apply 1 application topically as needed (Prior to port access). 30 g 2   mesalamine (LIALDA) 1.2 g EC tablet Take  2 tablets (2.4 g total) by mouth 2 (two) times daily with a meal. 120 tablet 11   OVER THE COUNTER MEDICATION Take 2 tablets by mouth daily.      Allergies as of 01/26/2021 - Review Complete 01/26/2021  Allergen Reaction Noted   Other  06/23/2019    Family History  Problem Relation Age of Onset   Breast cancer Maternal Aunt        dx unknown age   Diabetes Father    Diabetes Paternal Aunt    Breast cancer Cousin        maternal cousin; dx unknown age   Esophageal cancer Neg Hx    Rectal cancer Neg Hx    Stomach cancer Neg Hx    Colon polyps Neg Hx    Colon cancer Neg Hx     Social History   Socioeconomic History   Marital status: Significant Other    Spouse name: Not on file   Number of children: 1  Years of education: 20   Highest education level: Not on file  Occupational History   Occupation: Medical Coder   Tobacco Use   Smoking status: Former    Types: Cigarettes   Smokeless tobacco: Never  Vaping Use   Vaping Use: Never used  Substance and Sexual Activity   Alcohol use: Yes    Alcohol/week: 1.0 - 2.0 standard drink    Types: 1 - 2 Glasses of wine per week   Drug use: No   Sexual activity: Yes    Partners: Male    Comment: Intermittent protection not consistent  Other Topics Concern   Not on file  Social History Narrative   Single, 1 daughter born 2001 approximately   Medical coder for [employer]   She is a former smoker, occasional alcohol no drug use   Social Determinants of Radio broadcast assistant Strain: Not on file  Food Insecurity: Not on file  Transportation Needs: Not on file  Physical Activity: Not on file  Stress: Not on file  Social Connections: Not on file  Intimate Partner Violence: Not At Risk   Fear of Current or Ex-Partner: No   Emotionally Abused: No   Physically Abused: No   Sexually Abused: No   Review of Systems: ROS is O/W negative except as mentioned in HPI.  Physical Exam: Vital signs in last 24 hours: Temp:   [98.3 F (36.8 C)-98.7 F (37.1 C)] 98.7 F (37.1 C) (10/19 0725) Pulse Rate:  [100-118] 100 (10/19 1036) Resp:  [19-23] 19 (10/19 1036) BP: (140-151)/(100-107) 151/103 (10/19 1036) SpO2:  [98 %-100 %] 100 % (10/19 1036) Weight:  [68.9 kg] 68.9 kg (10/19 0713)   General:  Alert, Well-developed, well-nourished, pleasant and cooperative in NAD Head:  Normocephalic and atraumatic. Eyes:  Sclera clear, no icterus.  Conjunctiva pink. Ears:  Normal auditory acuity. Mouth:  No deformity or lesions.   Lungs:  Clear throughout to auscultation.  No wheezes, crackles, or rhonchi.  Heart:  Regular rate and rhythm; no murmurs, clicks, rubs, or gallops. Abdomen:  Soft, slightly distended.  BS present but quiet and sparse.  Mild diffuse TTP. Msk:  Symmetrical without gross deformities. Pulses:  Normal pulses noted. Extremities:  Without clubbing or edema. Neurologic:  Alert and oriented x 4;  grossly normal neurologically. Skin:  Intact without significant lesions or rashes. Psych:  Alert and cooperative. Normal mood and affect.  Lab Results: Recent Labs    01/26/21 0734  WBC 10.5  HGB 13.3  HCT 41.7  PLT 312   BMET Recent Labs    01/26/21 0734  NA 136  K 3.9  CL 100  CO2 25  GLUCOSE 122*  BUN 21*  CREATININE 1.03*  CALCIUM 9.7   LFT Recent Labs    01/26/21 0734  PROT 8.9*  ALBUMIN 4.7  AST 25  ALT 16  ALKPHOS 134*  BILITOT 1.1    Studies/Results: CT Abdomen Pelvis W Contrast  Result Date: 01/26/2021 CLINICAL DATA:  Abdominal pain EXAM: CT ABDOMEN AND PELVIS WITH CONTRAST TECHNIQUE: Multidetector CT imaging of the abdomen and pelvis was performed using the standard protocol following bolus administration of intravenous contrast. CONTRAST:  57m OMNIPAQUE IOHEXOL 350 MG/ML SOLN COMPARISON:  06/30/2020 FINDINGS: Lower chest: Bilateral breast implants. No pleural or pericardial effusion. Visualized lung bases clear. Hepatobiliary: No focal liver abnormality is seen. No  gallstones, gallbladder wall thickening, or biliary dilatation. Pancreas: Unremarkable. No pancreatic ductal dilatation or surrounding inflammatory changes. Spleen: Normal in size without  focal abnormality. Adrenals/Urinary Tract: Adrenal glands are unremarkable. Kidneys are normal, without renal calculi, focal lesion, or hydronephrosis. Bladder is unremarkable. Stomach/Bowel: Stomach is partially distended, unremarkable. Proximal loops of small bowel are nondilated. There are multiple dilated distal small bowel loops. Transition point is in the region of intussusception involving the ileocolic anastomosis. Distal sigmoid colon and rectum are decompressed. Vascular/Lymphatic: No significant vascular findings are present. No enlarged abdominal or pelvic lymph nodes. Reproductive: Enlarged uterus with peripherally calcified left fundal fibroid. No adnexal mass. Other: No ascites.  No free air. Musculoskeletal: No acute or significant osseous findings. IMPRESSION: 1. High-grade distal small bowel obstruction, transition point in the region of intussusception at the ileocolic anastomosis. Electronically Signed   By: Lucrezia Europe M.D.   On: 01/26/2021 09:49    IMPRESSION:  *High-grade distal small bowel obstruction with transition point in the region of intussusception at the ileocolic anastomosis *Crohn's colitis status post subtotal colectomy with ileal to sigmoid anastomosis in March 2021 by Dr. Marcello Moores.  Has been maintained on mesalamine 4.8 g daily.  She had diffuse inflammation in the distal rectum in 06/2020 that was thought to be drug-induced from her Docetaxil. *Breast cancer s/p bilateral nipple sparing mastectomy 11/09/20 by [name], currently on immunosuppressant therapy and undergoing radiation treatment  PLAN: -She definitely needs management of her small bowel obstruction with NG tube, bowel rest, antiemetics, etc. per surgery.  Once that improves/resolves to a degree then could consider prepping  her for flexible sigmoidoscopy to reassess degree of inflammation to determine if that could be driving this process.  Tentatively have plans for tomorrow afternoon if she improves some by then.   Laban Emperor. Kelly Eisler  01/26/2021, 12:14 PM

## 2021-01-26 NOTE — ED Provider Notes (Signed)
[facility] DEPT Provider Note   CSN: [id number] Arrival date & time: 01/26/21  0175    History Chief Complaint  Patient presents with   Abdominal Pain   [name] is a 45 y.o. female with past medical history significant for breast CA, crohn's colitis who presents for evaluation of abd pain. Diffuse abd pain since yesterday morning. Started some vaginal spotting initially however since resolved. Thought she was starting her menstrual cycle. Last LMP in March. Multiple NBNB emesis, not able to tolerate PO intake. Passing flatus, Last BM yesterday 4 pm. Sx began after radiation. Attempted Tylenol 1000 mg without relief. Pain 7/10. Pain intermittent in nature. No etoh use, chronic NSAID use. No melena or BRBPR.  Denies additional aggravating or relieving factors  Prior hx of colectomy with reversal  History obtained from patient and past medical records.  No interpreter is used.  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   Crohn's disease ([place])    Family history of breast cancer 06/11/2020   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 10/17/2010   Rectal bleeding    Uterine fibroid    Vitamin D deficiency 09/24/2013    Patient Active Problem List   Diagnosis Date Noted   SBO (small bowel obstruction) ([place]) 01/26/2021   Breast cancer ([place]) 11/09/2020   Port-A-Cath in place 07/15/2020   Genetic testing 06/25/2020   Family history of breast cancer 06/11/2020   Malignant neoplasm of upper-outer quadrant of right breast in female, estrogen receptor positive ([place]) 06/04/2020   Abnormal finding present on diagnostic imaging of uterus 05/07/2019   Vitamin D deficiency 09/24/2013   Crohn's colitis, with intestinal obstruction ([place]) 09/17/2013   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 10/17/2010     Past Surgical History:  Procedure Laterality Date   AXILLARY LYMPH NODE DISSECTION Right 11/09/2020   Procedure: RIGHT AXILLARY LYMPH NODE DISSECTION;  Surgeon: [name], MD;  Location: [facility];  Service: General;  Laterality: Right;   BIOPSY  07/01/2020   Procedure: BIOPSY;  Surgeon: Yetta Flock, MD;  Location: [facility];  Service: Gastroenterology;;   BREAST RECONSTRUCTION WITH PLACEMENT OF TISSUE EXPANDER AND FLEX HD (ACELLULAR HYDRATED DERMIS) Bilateral 11/09/2020   Procedure: BILATERAL BREAST RECONSTRUCTION WITH PLACEMENT OF TISSUE EXPANDER AND FLEX HD (ACELLULAR HYDRATED DERMIS);  Surgeon: [name], MD;  Location: [facility];  Service: Plastics;  Laterality: Bilateral;   COLECTOMY  06/27/2019   COLONOSCOPY  2015   FLEXIBLE SIGMOIDOSCOPY N/A 07/01/2020   Procedure: FLEXIBLE SIGMOIDOSCOPY;  Surgeon: Yetta Flock, MD;  Location[facility];  Service: Gastroenterology;  Laterality: N/A;   NIPPLE SPARING MASTECTOMY Bilateral 11/09/2020   Procedure: BILATERAL NIPPLE SPARING MASTECTOMY;  Surgeon: [name], MD;  Location: [facility];  Service: General;  Laterality: Bilateral;   PORTACATH PLACEMENT N/A 06/23/2020   Procedure: INSERTION PORT-A-CATH;  Surgeon: [name], MD;  Location: [facility];  Service: General;  Laterality: N/A;  START TIME OF 11:00 AM FOR 60 MINUTES [place] [facility]     OB History   No obstetric history on file.     Family History  Problem Relation Age of Onset   Breast cancer Maternal Aunt        dx unknown age   Diabetes  Father    Diabetes Paternal Aunt    Breast cancer Cousin        maternal cousin; dx unknown age   Esophageal cancer Neg Hx    Rectal cancer Neg Hx    Stomach cancer Neg Hx    Colon polyps Neg Hx    Colon cancer Neg Hx     Social History   Tobacco Use   Smoking status: Former    Types: Cigarettes   Smokeless tobacco: Never   Vaping Use   Vaping Use: Never used  Substance Use Topics   Alcohol use: Yes    Alcohol/week: 1.0 - 2.0 standard drink    Types: 1 - 2 Glasses of wine per week   Drug use: No    Home Medications Prior to Admission medications   Medication Sig Start Date End Date Taking? Authorizing Provider  acetaminophen (TYLENOL) 500 MG tablet Take 500-1,000 mg by mouth every 6 (six) hours as needed for mild pain.   Yes [provider]  lidocaine-prilocaine (EMLA) cream Apply 1 application topically as needed (Prior to port access). 06/16/20  Yes Benay Pike, MD  mesalamine (LIALDA) 1.2 g EC tablet Take 2 tablets (2.4 g total) by mouth 2 (two) times daily with a meal. 07/22/20  Yes [name], MD  OVER THE COUNTER MEDICATION Take 2 tablets by mouth daily.   Yes [provider]  prochlorperazine (COMPAZINE) 10 MG tablet Take 1 tablet (10 mg total) by mouth every 6 (six) hours as needed (Nausea or vomiting). 06/16/20 11/19/20  Benay Pike, MD    Allergies    Other  Review of Systems   Review of Systems  Constitutional: Negative.   HENT: Negative.    Respiratory: Negative.    Cardiovascular: Negative.   Gastrointestinal:  Positive for abdominal pain, nausea and vomiting. Negative for abdominal distention, anal bleeding, blood in stool, constipation, diarrhea and rectal pain.  Genitourinary: Negative.   Musculoskeletal: Negative.   Skin: Negative.   Neurological: Negative.   All other systems reviewed and are negative.  Physical Exam Updated Vital Signs BP (!) 151/103 (BP Location: Right Arm)   Pulse 100   Temp 98.7 F (37.1 C) (Oral)   Resp 19   Ht 5' 4"  (1.626 m)   Wt 68.9 kg   SpO2 100%   BMI 26.09 kg/m   Physical Exam Vitals and nursing note reviewed.  Constitutional:      General: She is not in acute distress.    Appearance: She is well-developed. She is not ill-appearing, toxic-appearing or diaphoretic.  HENT:     Head: Normocephalic and atraumatic.      Mouth/Throat:     Mouth: Mucous membranes are moist.  Eyes:     Pupils: Pupils are equal, round, and reactive to light.  Cardiovascular:     Rate and Rhythm: Normal rate.     Pulses: Normal pulses.          Radial pulses are 2+ on the right side and 2+ on the left side.       Dorsalis pedis pulses are 2+ on the right side and 2+ on the left side.     Heart sounds: Normal heart sounds.  Pulmonary:     Effort: Pulmonary effort is normal. No respiratory distress.     Breath sounds: Normal breath sounds.     Comments: Clear Bl, Speaks in full sentences without difficulty Abdominal:     General: Bowel sounds are normal. There is no  distension.     Palpations: Abdomen is soft.     Tenderness: There is generalized abdominal tenderness. There is guarding. There is no right CVA tenderness, left CVA tenderness or rebound.     Hernia: No hernia is present.  Musculoskeletal:        General: Normal range of motion.     Cervical back: Normal range of motion.  Skin:    General: Skin is warm and dry.     Capillary Refill: Capillary refill takes less than 2 seconds.  Neurological:     General: No focal deficit present.     Mental Status: She is alert and oriented to person, place, and time.  Psychiatric:        Mood and Affect: Mood normal.    ED Results / Procedures / Treatments   Labs (all labs ordered are listed, but only abnormal results are displayed) Labs Reviewed  COMPREHENSIVE METABOLIC PANEL - Abnormal; Notable for the following components:      Result Value   Glucose, Bld 122 (*)    BUN 21 (*)    Creatinine, Ser 1.03 (*)    Total Protein 8.9 (*)    Alkaline Phosphatase 134 (*)    All other components within normal limits  URINALYSIS, ROUTINE W REFLEX MICROSCOPIC - Abnormal; Notable for the following components:   APPearance HAZY (*)    Protein, ur 30 (*)    Bacteria, UA FEW (*)    All other components within normal limits  RESP PANEL BY RT-PCR (FLU A&B, COVID) [id number]   CULTURE, BLOOD (SINGLE)  LIPASE, BLOOD  CBC  LACTIC ACID, PLASMA  I-STAT BETA HCG BLOOD, ED ([facility], [facility], [facility] ONLY)    EKG EKG Interpretation  Date/Time:  Wednesday January 26 2021 10:35:55 EDT Ventricular Rate:  100 PR Interval:  148 QRS Duration: 80 QT Interval:  333 QTC Calculation: 430 R Axis:   45 Text Interpretation: Sinus tachycardia Probable left atrial enlargement Probable left ventricular hypertrophy No old tracing to compare Confirmed by Daleen Bo [phone]) on 01/26/2021 10:43:32 AM  Radiology CT Abdomen Pelvis W Contrast  Result Date: 01/26/2021 CLINICAL DATA:  Abdominal pain EXAM: CT ABDOMEN AND PELVIS WITH CONTRAST TECHNIQUE: Multidetector CT imaging of the abdomen and pelvis was performed using the standard protocol following bolus administration of intravenous contrast. CONTRAST:  38m OMNIPAQUE IOHEXOL 350 MG/ML SOLN COMPARISON:  06/30/2020 FINDINGS: Lower chest: Bilateral breast implants. No pleural or pericardial effusion. Visualized lung bases clear. Hepatobiliary: No focal liver abnormality is seen. No gallstones, gallbladder wall thickening, or biliary dilatation. Pancreas: Unremarkable. No pancreatic ductal dilatation or surrounding inflammatory changes. Spleen: Normal in size without focal abnormality. Adrenals/Urinary Tract: Adrenal glands are unremarkable. Kidneys are normal, without renal calculi, focal lesion, or hydronephrosis. Bladder is unremarkable. Stomach/Bowel: Stomach is partially distended, unremarkable. Proximal loops of small bowel are nondilated. There are multiple dilated distal small bowel loops. Transition point is in the region of intussusception involving the ileocolic anastomosis. Distal sigmoid colon and rectum are decompressed. Vascular/Lymphatic: No significant vascular findings are present. No enlarged abdominal or pelvic lymph nodes. Reproductive: Enlarged uterus with peripherally calcified left fundal fibroid. No adnexal mass. Other: No  ascites.  No free air. Musculoskeletal: No acute or significant osseous findings. IMPRESSION: 1. High-grade distal small bowel obstruction, transition point in the region of intussusception at the ileocolic anastomosis. Electronically Signed   By: DLucrezia EuropeM.D.   On: 01/26/2021 09:49    Procedures Procedures   Medications Ordered in ED Medications  sodium chloride (PF) 0.9 % injection (  Not Given 01/26/21 0858)  HYDROmorphone (DILAUDID) injection 0.5 mg (0 mg Intravenous Hold 01/26/21 1108)  diatrizoate meglumine-sodium (GASTROGRAFIN) 66-10 % solution 90 mL (has no administration in time range)  enoxaparin (LOVENOX) injection 40 mg (has no administration in time range)  0.9 %  sodium chloride infusion (has no administration in time range)  morphine 2 MG/ML injection 2 mg (has no administration in time range)  acetaminophen (TYLENOL) tablet 650 mg (has no administration in time range)    Or  acetaminophen (TYLENOL) suppository 650 mg (has no administration in time range)  ondansetron (ZOFRAN-ODT) disintegrating tablet 4 mg (has no administration in time range)    Or  ondansetron (ZOFRAN) injection 4 mg (has no administration in time range)  metoprolol tartrate (LOPRESSOR) injection 5 mg (has no administration in time range)  sodium chloride 0.9 % bolus 1,000 mL (1,000 mLs Intravenous New Bag/Given 01/26/21 0822)  ondansetron (ZOFRAN) injection 4 mg (4 mg Intravenous Given 01/26/21 0823)  morphine 4 MG/ML injection 4 mg (4 mg Intravenous Given 01/26/21 0825)  iohexol (OMNIPAQUE) 350 MG/ML injection 80 mL (80 mLs Intravenous Contrast Given 01/26/21 0900)    ED Course  I have reviewed the triage vital signs and the nursing notes.  Pertinent labs & imaging results that were available during my care of the patient were reviewed by me and considered in my medical decision making (see chart for details).  45 year old complicated medical history here for evaluation of generalized abdominal  pain, nausea and vomiting.  Afebrile, nonseptic, not ill-appearing.  Currently undergoing treatment for breast cancer.  Prior history of colitis s/p colectomy with reversal.  She is passing flatus.  NBNB emesis, no melena or blood per rectum.  Mildly tachycardic however denies any chest pain, shortness of breath, lateral leg swelling, redness or warmth.  Low suspicion for atypical intrathoracic etiology such as PE, PNA, AAA, dissection as cause of her symptoms.  Plan on labs, imaging and reassess: CBC without leukocytosis CMP creatinine 1.03, alk phos 134 normal other LFT, Tbili Lipase 47 UA neg for infection Lactic 1.5 BC pending  CT AP grade bowel obstruction at point of possible intussusception  Patient reassessed.  Discussed labs and imaging.  Emesis here in ED however is having some increased pain and gas however appears comfortable in room.  Will write for additional medications as well as consult with general surgery   CONSULT with Jerene Pitch with general surgery who will evaluate patient for admission.  The patient appears reasonably stabilized for admission considering the current resources, flow, and capabilities available in the ED at this time, and I doubt any other [facility] requiring further screening and/or treatment in the ED prior to admission.   Discussed with attending who agrees with above treatment, plan and disposition     MDM Rules/Calculators/A&P                            Final Clinical Impression(s) / ED Diagnoses Final diagnoses:  Complete intestinal obstruction, unspecified cause [facility])    Rx / DC Orders ED Discharge Orders     None        Rie Mcneil A, PA-C 01/26/21 1142    Daleen Bo, MD 01/26/21 1802

## 2021-01-27 ENCOUNTER — Encounter (HOSPITAL_COMMUNITY): Payer: Self-pay

## 2021-01-27 ENCOUNTER — Encounter (HOSPITAL_COMMUNITY)
Admission: EM | Disposition: A | Discharge status: Home / Self Care | Payer: Self-pay | Source: Home / Self Care | Attending: Emergency Medicine

## 2021-01-27 ENCOUNTER — Observation Stay (HOSPITAL_COMMUNITY): Payer: Managed Care, Other (non HMO) | Admitting: Anesthesiology

## 2021-01-27 ENCOUNTER — Ambulatory Visit: Payer: Managed Care, Other (non HMO)

## 2021-01-27 DIAGNOSIS — K6289 Other specified diseases of anus and rectum: Secondary | ICD-10-CM | POA: Diagnosis not present

## 2021-01-27 DIAGNOSIS — K50812 Crohn's disease of both small and large intestine with intestinal obstruction: Secondary | ICD-10-CM

## 2021-01-27 DIAGNOSIS — Z8719 Personal history of other diseases of the digestive system: Secondary | ICD-10-CM | POA: Diagnosis not present

## 2021-01-27 DIAGNOSIS — K50819 Crohn's disease of both small and large intestine with unspecified complications: Secondary | ICD-10-CM

## 2021-01-27 DIAGNOSIS — K56609 Unspecified intestinal obstruction, unspecified as to partial versus complete obstruction: Secondary | ICD-10-CM | POA: Diagnosis not present

## 2021-01-27 HISTORY — PX: FLEXIBLE SIGMOIDOSCOPY: SHX5431

## 2021-01-27 HISTORY — PX: BIOPSY: SHX5522

## 2021-01-27 LAB — CBC
HCT: 35.3 % — ABNORMAL LOW (ref 36.0–46.0)
Hemoglobin: 11 g/dL — ABNORMAL LOW (ref 12.0–15.0)
MCH: 26.4 pg (ref 26.0–34.0)
MCHC: 31.2 g/dL (ref 30.0–36.0)
MCV: 84.9 fL (ref 80.0–100.0)
Platelets: 233 10*3/uL (ref 150–400)
RBC: 4.16 MIL/uL (ref 3.87–5.11)
RDW: 14.3 % (ref 11.5–15.5)
WBC: 4.9 10*3/uL (ref 4.0–10.5)
nRBC: 0 % (ref 0.0–0.2)

## 2021-01-27 LAB — BASIC METABOLIC PANEL
Anion gap: 7 (ref 5–15)
BUN: 21 mg/dL — ABNORMAL HIGH (ref 6–20)
CO2: 25 mmol/L (ref 22–32)
Calcium: 8.7 mg/dL — ABNORMAL LOW (ref 8.9–10.3)
Chloride: 107 mmol/L (ref 98–111)
Creatinine, Ser: 0.98 mg/dL (ref 0.44–1.00)
GFR, Estimated: >60 mL/min (ref >60)
Glucose, Bld: 94 mg/dL (ref 70–99)
Potassium: 3.8 mmol/L (ref 3.5–5.1)
Sodium: 139 mmol/L (ref 135–145)

## 2021-01-27 SURGERY — SIGMOIDOSCOPY, FLEXIBLE
Anesthesia: General

## 2021-01-27 MED ORDER — FENTANYL CITRATE (PF) 100 MCG/2ML IJ SOLN
INTRAMUSCULAR | Status: AC
  Filled 2021-01-27: qty 2

## 2021-01-27 MED ORDER — LIDOCAINE 2% (20 MG/ML) 5 ML SYRINGE
INTRAMUSCULAR | Status: DC | PRN
  Administered 2021-01-27: 80 mg via INTRAVENOUS

## 2021-01-27 MED ORDER — PROPOFOL 10 MG/ML IV BOLUS
INTRAVENOUS | Status: AC
  Filled 2021-01-27: qty 20

## 2021-01-27 MED ORDER — MIDAZOLAM HCL 5 MG/5ML IJ SOLN
INTRAMUSCULAR | Status: DC | PRN
  Administered 2021-01-27: 2 mg via INTRAVENOUS

## 2021-01-27 MED ORDER — MIDAZOLAM HCL 2 MG/2ML IJ SOLN
INTRAMUSCULAR | Status: AC
  Filled 2021-01-27: qty 2

## 2021-01-27 MED ORDER — LACTATED RINGERS IV SOLN: INTRAVENOUS | Status: DC | PRN

## 2021-01-27 MED ORDER — PROPOFOL 10 MG/ML IV BOLUS
INTRAVENOUS | Status: DC | PRN
  Administered 2021-01-27: 20 mg via INTRAVENOUS
  Administered 2021-01-27: 30 mg via INTRAVENOUS
  Administered 2021-01-27: 50 mg via INTRAVENOUS

## 2021-01-27 MED ORDER — LACTATED RINGERS IV SOLN
INTRAVENOUS | Status: AC | PRN
  Administered 2021-01-27: 10 mL/h via INTRAVENOUS

## 2021-01-27 MED ORDER — MENTHOL 3 MG MT LOZG: 1.0000 | LOZENGE | OROMUCOSAL | Status: DC | PRN

## 2021-01-27 NOTE — Progress Notes (Signed)
I met with the patient today. She is considering her options regarding her breast cancer. She feels that the XRT "may be too strong" for her. We reviewed her XRT plan in detail once again, including the rationale for treatment given the locally aggressive nature of her tumor at presentation, as well as the details of her course. We discussed that this treatment is not related to her bowel issues. She understands my recommendation to continue XRT as soon as she is able to proceed. She is going think about how she wants to proceed and we will check with her again tomorrow to see what she has decided.  ------------------------------------------------  Jodelle Gross, MD, PhD

## 2021-01-27 NOTE — Op Note (Signed)
[facility] Patient Name: [name] Procedure Date: 01/27/2021 MRN: [id number] Attending MD: Carlota Raspberry. Havery Moros , MD Date of Birth: 1975-11-04 CSN: [id number] Age: 45 Admit Type: Inpatient Procedure:                Flexible Sigmoidoscopy Indications:              Abnormal CT of the GI tract - history of colonic                            Crohn's s/p resection - subtotal colectomy with                            ileal-sigmoid anastomosis, currently not on any                            Crohn's specific therapy, admitted with bowel                            obstruction reportedly near surgical anastomosis                            which has clinically improved. Flex sig to further                            evaluate anastomosis and ileum. Providers:                Carlota Raspberry. Havery Moros, MD, Truddie Coco, RN, Particia Nearing, RN, [employer], CRNA Referring MD:              Medicines:                Monitored Anesthesia Care Complications:            No immediate complications. Estimated blood loss:                            Minimal. Estimated Blood Loss:     Estimated blood loss was minimal. Procedure:                Pre-Anesthesia Assessment:                           - Prior to the procedure, a History and Physical                            was performed, and patient medications and                            allergies were reviewed. The patient's tolerance of                            previous anesthesia was also reviewed. The risks  and benefits of the procedure and the sedation                            options and risks were discussed with the patient.                            All questions were answered, and informed consent                            was obtained. Prior Anticoagulants: The patient has                            taken no previous anticoagulant or antiplatelet                             agents. ASA Grade Assessment: II - A patient with                            mild systemic disease. After reviewing the risks                            and benefits, the patient was deemed in                            satisfactory condition to undergo the procedure.                           After obtaining informed consent, the scope was                            passed under direct vision. The [id number]-HQ190L                            ([id number] Olympus colonoscope was introduced                            through the anus and advanced to the the                            ileo-sigmoid anastomosis. The flexible                            sigmoidoscopy was accomplished without difficulty.                            The patient tolerated the procedure well. The                            quality of the bowel preparation was fair. Scope In: 1:46:06 PM Scope Out: 1:59:38 PM Total Procedure Duration: 0 hours 13 minutes 32 seconds  Findings:      The perianal and digital rectal examinations were normal.      Scaterred small patchy erosions and small areas of erythema was found in  the ileum. Biopsies obtained. The ileum otherwise was deeply intubated.       No focal stenosis / stricture appreciated, no mass lesions. No obvious       pathology noted to cause obstruction.      There was evidence of a end-to-side ileo-colonic anastomosis in the       sigmoid colon. This was patent and was characterized by healthy       appearing mucosa. The anastomosis was easily traversed.      A moderate amount of semi-solid stool was found in the rectum, in the       recto-sigmoid colon and in the sigmoid colon, making visualization       difficult in the colon. Time was spent to lavage the colon to visualize       the anastomosis. The scope clogged a few times during this process which       prolonged the exam.      A patchy area of mildly erythematous mucosa was found in the distal        rectum. Biopsies were taken with a cold forceps for histology.      The exam was otherwise without abnormality in the colon of what was       visualized. All air suctioned from the ileum and colon at the end of the       case. Impression:               - Preparation of the colon was fair, time spent to                            lavage the colon to complete the exam.                           - Patient ileo-sigmoid surgical anastomosis which                            appeared healthy.                           - Ileum deeply intubated - some scattered areas of                            small erosions and nonspecific mild erythema but no                            significant inflammation, no stenosis / stricture /                            mass lesions appreciated. Ileal biopsies obtained                           - Erythematous mucosa in the distal rectum.                            Biopsied.                           - Colon otherwise normal but views limited in the  colon due to prep.                           Unclear cause of obstruction - no focal pathology                            noted on the exam to cause this. Anastomosis                            appears patent. Mild scattered inflammatory changes                            of the ileum of unclear significance. Moderate Sedation:      No moderate sedation, case performed with MAC Recommendation:           - Return patient to hospital ward for ongoing care.                           - Clear liquid diet if okay with surgical service.                           - Continue present medications.                           - Await pathology results.                           - GI service will follow, call with questions Procedure Code(s):        --- Professional ---                           [phone], Sigmoidoscopy, flexible; with biopsy, single                            or multiple Diagnosis Code(s):         --- Professional ---                           K62.89, Other specified diseases of anus and rectum                           R93.3, Abnormal findings on diagnostic imaging of                            other parts of digestive tract CPT copyright 2019 [employer]. All rights reserved. The codes documented in this report are preliminary and upon coder review may  be revised to meet current compliance requirements. Remo Lipps P. Bolivar Koranda, MD 01/27/2021 2:18:56 PM This report has been signed electronically. Number of Addenda: 0

## 2021-01-27 NOTE — H&P (View-Only) (Signed)
[facility] Progress Note  CC:  SBO, Crohn's disease  Subjective:  Feel better.  Not as much abdominal pain or distention/bloating.  Had two decent sized BMs.  Had about 600 mLs from NGT.  She wants the NGT taken out, throat is very sore.  Objective:  Vital signs in last 24 hours: Temp:  [97.8 F (36.6 C)-98.4 F (36.9 C)] 97.8 F (36.6 C) (10/20 0718) Pulse Rate:  [98-112] 98 (10/20 0718) Resp:  [17-19] 17 (10/20 0718) BP: (114-151)/(82-103) 121/82 (10/20 0718) SpO2:  [98 %-100 %] 98 % (10/20 0718) Last BM Date: 01/27/21 General:  Alert, Well-developed, in NAD; uncomfortable form NGT. Heart:  Regular rate and rhythm; no murmurs Pulm:  CTAB.  No W/R/R. Abdomen:  Soft, non-distended.  BS present, but still very quiet and sparse.  Non-tender.     Extremities:  Without edema. Neurologic:  Alert and oriented x 4;  grossly normal neurologically. Psych:  Alert and cooperative. Normal mood and affect.  Intake/Output from previous day: 10/19 0701 - 10/20 0700 In: 1237.6 [I.V.:1237.6] Out: 1700 [Urine:400; Emesis/NG output:400; Stool:900]  Lab Results: Recent Labs    01/26/21 0734 01/27/21 0437  WBC 10.5 4.9  HGB 13.3 11.0*  HCT 41.7 35.3*  PLT 312 233   BMET Recent Labs    01/26/21 0734 01/27/21 0437  NA 136 139  K 3.9 3.8  CL 100 107  CO2 25 25  GLUCOSE 122* 94  BUN 21* 21*  CREATININE 1.03* 0.98  CALCIUM 9.7 8.7*   LFT Recent Labs    01/26/21 0734  PROT 8.9*  ALBUMIN 4.7  AST 25  ALT 16  ALKPHOS 134*  BILITOT 1.1   CT Abdomen Pelvis W Contrast  Result Date: 01/26/2021 CLINICAL DATA:  Abdominal pain EXAM: CT ABDOMEN AND PELVIS WITH CONTRAST TECHNIQUE: Multidetector CT imaging of the abdomen and pelvis was performed using the standard protocol following bolus administration of intravenous contrast. CONTRAST:  53m OMNIPAQUE IOHEXOL 350 MG/ML SOLN COMPARISON:  06/30/2020 FINDINGS: Lower chest: Bilateral breast implants. No pleural or  pericardial effusion. Visualized lung bases clear. Hepatobiliary: No focal liver abnormality is seen. No gallstones, gallbladder wall thickening, or biliary dilatation. Pancreas: Unremarkable. No pancreatic ductal dilatation or surrounding inflammatory changes. Spleen: Normal in size without focal abnormality. Adrenals/Urinary Tract: Adrenal glands are unremarkable. Kidneys are normal, without renal calculi, focal lesion, or hydronephrosis. Bladder is unremarkable. Stomach/Bowel: Stomach is partially distended, unremarkable. Proximal loops of small bowel are nondilated. There are multiple dilated distal small bowel loops. Transition point is in the region of intussusception involving the ileocolic anastomosis. Distal sigmoid colon and rectum are decompressed. Vascular/Lymphatic: No significant vascular findings are present. No enlarged abdominal or pelvic lymph nodes. Reproductive: Enlarged uterus with peripherally calcified left fundal fibroid. No adnexal mass. Other: No ascites.  No free air. Musculoskeletal: No acute or significant osseous findings. IMPRESSION: 1. High-grade distal small bowel obstruction, transition point in the region of intussusception at the ileocolic anastomosis. Electronically Signed   By: DLucrezia EuropeM.D.   On: 01/26/2021 09:49   DG Abd Portable 1V-Small Bowel Obstruction Protocol-initial, 8 hr delay  Result Date: 01/26/2021 CLINICAL DATA:  8 hour delay EXAM: PORTABLE ABDOMEN - 1 VIEW COMPARISON:  01/26/2021, CT 01/26/2021 FINDINGS: Esophageal tube tip overlies the gastric fundus. Persistent gaseous dilatation of bowel measuring up to 5.4 cm. Contrast present within dilated small bowel as well as the rectum. Contrast within the bladder. IMPRESSION: Persistent dilatation of small bowel up to 5.4 cm.  Contrast is present within dilated distal small bowel as well as the rectum. Electronically Signed   By: Donavan Foil M.D.   On: 01/26/2021 23:24   DG Abd Portable 1V-Small Bowel  Protocol-Position Verification  Result Date: 01/26/2021 CLINICAL DATA:  Tube placement EXAM: PORTABLE ABDOMEN - 1 VIEW COMPARISON:  None. FINDINGS: Esophagogastric tube with tip and side port below the diaphragm, tip in the gastric fundus. Distended loops of bowel in the central abdomen measuring up to 5.4 cm in caliber. No obvious free air. IMPRESSION: 1. Esophagogastric tube with tip and side port below the diaphragm, tip in the gastric fundus. 2. Distended loops of bowel in the central abdomen measuring up to 5.4 cm in caliber. Electronically Signed   By: Delanna Ahmadi M.D.   On: 01/26/2021 13:06    Assessment / Plan: *High-grade distal small bowel obstruction with transition point in the region of intussusception at the ileocolic anastomosis *Crohn's colitis status post subtotal colectomy with ileal to sigmoid anastomosis in March 2021 by Dr. Marcello Moores.  Has been maintained on mesalamine 4.8 g daily.  She had diffuse inflammation in the distal rectum in 06/2020 that was thought to be drug-induced from her Docetaxil. *Breast cancer s/p bilateral nipple sparing mastectomy 11/09/20 by [name], currently on immunosuppressant therapy and undergoing radiation treatment  -Continue NGT for now. -Flex sig with [name] this afternoon.  Tap water enemas ordered.  Lovenox held for now.    LOS: 0 days   Laban Emperor. Arleatha Philipps  01/27/2021, 9:04 AM

## 2021-01-27 NOTE — Progress Notes (Signed)
[facility] Progress Note  CC:  SBO, Crohn's disease  Subjective:  Feel better.  Not as much abdominal pain or distention/bloating.  Had two decent sized BMs.  Had about 600 mLs from NGT.  She wants the NGT taken out, throat is very sore.  Objective:  Vital signs in last 24 hours: Temp:  [97.8 F (36.6 C)-98.4 F (36.9 C)] 97.8 F (36.6 C) (10/20 0718) Pulse Rate:  [98-112] 98 (10/20 0718) Resp:  [17-19] 17 (10/20 0718) BP: (114-151)/(82-103) 121/82 (10/20 0718) SpO2:  [98 %-100 %] 98 % (10/20 0718) Last BM Date: 01/27/21 General:  Alert, Well-developed, in NAD; uncomfortable form NGT. Heart:  Regular rate and rhythm; no murmurs Pulm:  CTAB.  No W/R/R. Abdomen:  Soft, non-distended.  BS present, but still very quiet and sparse.  Non-tender.     Extremities:  Without edema. Neurologic:  Alert and oriented x 4;  grossly normal neurologically. Psych:  Alert and cooperative. Normal mood and affect.  Intake/Output from previous day: 10/19 0701 - 10/20 0700 In: 1237.6 [I.V.:1237.6] Out: 1700 [Urine:400; Emesis/NG output:400; Stool:900]  Lab Results: Recent Labs    01/26/21 0734 01/27/21 0437  WBC 10.5 4.9  HGB 13.3 11.0*  HCT 41.7 35.3*  PLT 312 233   BMET Recent Labs    01/26/21 0734 01/27/21 0437  NA 136 139  K 3.9 3.8  CL 100 107  CO2 25 25  GLUCOSE 122* 94  BUN 21* 21*  CREATININE 1.03* 0.98  CALCIUM 9.7 8.7*   LFT Recent Labs    01/26/21 0734  PROT 8.9*  ALBUMIN 4.7  AST 25  ALT 16  ALKPHOS 134*  BILITOT 1.1   CT Abdomen Pelvis W Contrast  Result Date: 01/26/2021 CLINICAL DATA:  Abdominal pain EXAM: CT ABDOMEN AND PELVIS WITH CONTRAST TECHNIQUE: Multidetector CT imaging of the abdomen and pelvis was performed using the standard protocol following bolus administration of intravenous contrast. CONTRAST:  30m OMNIPAQUE IOHEXOL 350 MG/ML SOLN COMPARISON:  06/30/2020 FINDINGS: Lower chest: Bilateral breast implants. No pleural or  pericardial effusion. Visualized lung bases clear. Hepatobiliary: No focal liver abnormality is seen. No gallstones, gallbladder wall thickening, or biliary dilatation. Pancreas: Unremarkable. No pancreatic ductal dilatation or surrounding inflammatory changes. Spleen: Normal in size without focal abnormality. Adrenals/Urinary Tract: Adrenal glands are unremarkable. Kidneys are normal, without renal calculi, focal lesion, or hydronephrosis. Bladder is unremarkable. Stomach/Bowel: Stomach is partially distended, unremarkable. Proximal loops of small bowel are nondilated. There are multiple dilated distal small bowel loops. Transition point is in the region of intussusception involving the ileocolic anastomosis. Distal sigmoid colon and rectum are decompressed. Vascular/Lymphatic: No significant vascular findings are present. No enlarged abdominal or pelvic lymph nodes. Reproductive: Enlarged uterus with peripherally calcified left fundal fibroid. No adnexal mass. Other: No ascites.  No free air. Musculoskeletal: No acute or significant osseous findings. IMPRESSION: 1. High-grade distal small bowel obstruction, transition point in the region of intussusception at the ileocolic anastomosis. Electronically Signed   By: DLucrezia EuropeM.D.   On: 01/26/2021 09:49   DG Abd Portable 1V-Small Bowel Obstruction Protocol-initial, 8 hr delay  Result Date: 01/26/2021 CLINICAL DATA:  8 hour delay EXAM: PORTABLE ABDOMEN - 1 VIEW COMPARISON:  01/26/2021, CT 01/26/2021 FINDINGS: Esophageal tube tip overlies the gastric fundus. Persistent gaseous dilatation of bowel measuring up to 5.4 cm. Contrast present within dilated small bowel as well as the rectum. Contrast within the bladder. IMPRESSION: Persistent dilatation of small bowel up to 5.4 cm.  Contrast is present within dilated distal small bowel as well as the rectum. Electronically Signed   By: Donavan Foil M.D.   On: 01/26/2021 23:24   DG Abd Portable 1V-Small Bowel  Protocol-Position Verification  Result Date: 01/26/2021 CLINICAL DATA:  Tube placement EXAM: PORTABLE ABDOMEN - 1 VIEW COMPARISON:  None. FINDINGS: Esophagogastric tube with tip and side port below the diaphragm, tip in the gastric fundus. Distended loops of bowel in the central abdomen measuring up to 5.4 cm in caliber. No obvious free air. IMPRESSION: 1. Esophagogastric tube with tip and side port below the diaphragm, tip in the gastric fundus. 2. Distended loops of bowel in the central abdomen measuring up to 5.4 cm in caliber. Electronically Signed   By: Delanna Ahmadi M.D.   On: 01/26/2021 13:06    Assessment / Plan: *High-grade distal small bowel obstruction with transition point in the region of intussusception at the ileocolic anastomosis *Crohn's colitis status post subtotal colectomy with ileal to sigmoid anastomosis in March 2021 by Dr. Marcello Moores.  Has been maintained on mesalamine 4.8 g daily.  She had diffuse inflammation in the distal rectum in 06/2020 that was thought to be drug-induced from her Docetaxil. *Breast cancer s/p bilateral nipple sparing mastectomy 11/09/20 by [name], currently on immunosuppressant therapy and undergoing radiation treatment  -Continue NGT for now. -Flex sig with [name] this afternoon.  Tap water enemas ordered.  Lovenox held for now.    LOS: 0 days   Laban Emperor. Britzy Graul  01/27/2021, 9:04 AM

## 2021-01-27 NOTE — Anesthesia Postprocedure Evaluation (Signed)
Anesthesia Post Note  Patient: [name]  Procedure(s) Performed: FLEXIBLE SIGMOIDOSCOPY BIOPSY     Patient location during evaluation: PACU Anesthesia Type: General Level of consciousness: awake and alert and oriented Pain management: pain level controlled Vital Signs Assessment: post-procedure vital signs reviewed and stable Respiratory status: spontaneous breathing, nonlabored ventilation and respiratory function stable Cardiovascular status: blood pressure returned to baseline Postop Assessment: no apparent nausea or vomiting Anesthetic complications: no   No notable events documented.  Last Vitals:  Vitals:   01/27/21 1430 01/27/21 1449  BP: 128/88 (!) 125/98  Pulse: 88 97  Resp: 17 16  Temp:  36.5 C  SpO2: 100% 100%    Last Pain:  Vitals:   01/27/21 1449  TempSrc: Oral  PainSc:                  Marthenia Rolling

## 2021-01-27 NOTE — Interval H&P Note (Signed)
History and Physical Interval Note: No interval changes since progress note written.  01/27/2021 1:31 PM  [name]  has presented today for surgery, with the diagnosis of Crohn's disease and SBO.  The various methods of treatment have been discussed with the patient and family. After consideration of risks, benefits and other options for treatment, the patient has consented to  Procedure(s): FLEXIBLE SIGMOIDOSCOPY (N/A) as a surgical intervention.  The patient's history has been reviewed, patient examined, no change in status, stable for surgery.  I have reviewed the patient's chart and labs.  Questions were answered to the patient's satisfaction.     [place]

## 2021-01-27 NOTE — Transfer of Care (Signed)
Immediate Anesthesia Transfer of Care Note  Patient: [name]  Procedure(s) Performed: FLEXIBLE SIGMOIDOSCOPY BIOPSY  Patient Location: PACU  Anesthesia Type:MAC  Level of Consciousness: awake, alert  and oriented  Airway & Oxygen Therapy: Patient Spontanous Breathing and Patient connected to face mask oxygen  Post-op Assessment: Report given to RN and Post -op Vital signs reviewed and stable  Post vital signs: Reviewed and stable  Last Vitals:  Vitals Value Taken Time  BP 114/80 01/27/21 1410  Temp    Pulse 84 01/27/21 1413  Resp 16 01/27/21 1413  SpO2 100 % 01/27/21 1413  Vitals shown include unvalidated device data.  Last Pain:  Vitals:   01/27/21 1245  TempSrc: Oral  PainSc: 0-No pain         Complications: No notable events documented.

## 2021-01-27 NOTE — Anesthesia Preprocedure Evaluation (Addendum)
Anesthesia Evaluation  Patient identified by MRN, date of birth, ID band Patient awake    Reviewed: Allergy & Precautions, NPO status , Patient's Chart, lab work & pertinent test results  History of Anesthesia Complications Negative for: history of anesthetic complications  Airway Mallampati: II  TM Distance: >3 FB Neck ROM: Full    Dental no notable dental hx.    Pulmonary former smoker,    Pulmonary exam normal        Cardiovascular negative cardio ROS Normal cardiovascular exam     Neuro/Psych negative neurological ROS  negative psych ROS   GI/Hepatic Neg liver ROS, Crohn's dx, SBO   Endo/Other  negative endocrine ROS  Renal/GU negative Renal ROS  negative genitourinary   Musculoskeletal negative musculoskeletal ROS (+)   Abdominal   Peds  Hematology  (+) anemia , REFUSES BLOOD PRODUCTS, JEHOVAH'S WITNESSHgb 11.0   Anesthesia Other Findings Hx of breast ca s/p bilateral mastectomy  Reproductive/Obstetrics negative OB ROS                            Anesthesia Physical Anesthesia Plan  ASA: 2  Anesthesia Plan: MAC   Post-op Pain Management:    Induction: Intravenous  PONV Risk Score and Plan: Treatment may vary due to age or medical condition, Ondansetron and Propofol infusion  Airway Management Planned: Natural Airway and Simple Face Mask  Additional Equipment: None  Intra-op Plan:   Post-operative Plan:   Informed Consent: I have reviewed the patients History and Physical, chart, labs and discussed the procedure including the risks, benefits and alternatives for the proposed anesthesia with the patient or authorized representative who has indicated his/her understanding and acceptance.       Plan Discussed with: CRNA  Anesthesia Plan Comments:       Anesthesia Quick Evaluation

## 2021-01-27 NOTE — Progress Notes (Addendum)
Day of Surgery  Subjective: CC: Patient complains of irritation in the back of her throat from the NGT. No nausea. Abdomen feels less tight. She reports abdominal pain resolved currently but will still get intermittent epigastric cramping pain that occurs randomly and will last for a few seconds to minutes before resolving spontaneously. She reports she is passing flatus (1-2x) and had 2 large, loose bm's yesterday. NGT w/ 600cc bilious output. There is contrast that passed into her rectum on last night's xray.   Objective: Vital signs in last 24 hours: Temp:  [97.8 F (36.6 C)-98.4 F (36.9 C)] 98.2 F (36.8 C) (10/20 0929) Pulse Rate:  [98-112] 99 (10/20 0929) Resp:  [17-19] 18 (10/20 0929) BP: (114-151)/(82-103) 128/87 (10/20 0929) SpO2:  [98 %-100 %] 98 % (10/20 0929) Last BM Date: 01/27/21  Intake/Output from previous day: 10/19 0701 - 10/20 0700 In: 1237.6 [I.V.:1237.6] Out: 1700 [Urine:400; Emesis/NG output:400; Stool:900] Intake/Output this shift: No intake/output data recorded.  PE: Gen:  Alert, NAD, pleasant HEENT: EOM's intact, pupils equal and round Card:  RRR Pulm:  CTAB, no W/R/R, effort normal Abd: Soft, ND, mild epigastric tenderness, +BS, prior midline scar that is well healed. NGT w/ 600cc bilious output Ext:  No LE edema or calf tenderness Psych: A&Ox3  Skin: no rashes noted, warm and dry  Lab Results:  Recent Labs    01/26/21 0734 01/27/21 0437  WBC 10.5 4.9  HGB 13.3 11.0*  HCT 41.7 35.3*  PLT 312 233   BMET Recent Labs    01/26/21 0734 01/27/21 0437  NA 136 139  K 3.9 3.8  CL 100 107  CO2 25 25  GLUCOSE 122* 94  BUN 21* 21*  CREATININE 1.03* 0.98  CALCIUM 9.7 8.7*   PT/INR No results for input(s): LABPROT, INR in the last 72 hours. CMP     Component Value Date/Time   NA 139 01/27/2021 0437   NA 135 01/21/2020 1056   K 3.8 01/27/2021 0437   CL 107 01/27/2021 0437   CO2 25 01/27/2021 0437   GLUCOSE 94 01/27/2021 0437   BUN  21 (H) 01/27/2021 0437   BUN 12 01/21/2020 1056   CREATININE 0.98 01/27/2021 0437   CREATININE 0.79 01/21/2021 0817   CALCIUM 8.7 (L) 01/27/2021 0437   PROT 8.9 (H) 01/26/2021 0734   ALBUMIN 4.7 01/26/2021 0734   AST 25 01/26/2021 0734   AST 17 01/21/2021 0817   ALT 16 01/26/2021 0734   ALT 13 01/21/2021 0817   ALKPHOS 134 (H) 01/26/2021 0734   BILITOT 1.1 01/26/2021 0734   BILITOT 0.5 01/21/2021 0817   GFRNONAA >60 01/27/2021 0437   GFRNONAA >60 01/21/2021 0817   GFRAA 108 01/21/2020 1056   Lipase     Component Value Date/Time   LIPASE 47 01/26/2021 0734    Studies/Results: CT Abdomen Pelvis W Contrast  Result Date: 01/26/2021 CLINICAL DATA:  Abdominal pain EXAM: CT ABDOMEN AND PELVIS WITH CONTRAST TECHNIQUE: Multidetector CT imaging of the abdomen and pelvis was performed using the standard protocol following bolus administration of intravenous contrast. CONTRAST:  41m OMNIPAQUE IOHEXOL 350 MG/ML SOLN COMPARISON:  06/30/2020 FINDINGS: Lower chest: Bilateral breast implants. No pleural or pericardial effusion. Visualized lung bases clear. Hepatobiliary: No focal liver abnormality is seen. No gallstones, gallbladder wall thickening, or biliary dilatation. Pancreas: Unremarkable. No pancreatic ductal dilatation or surrounding inflammatory changes. Spleen: Normal in size without focal abnormality. Adrenals/Urinary Tract: Adrenal glands are unremarkable. Kidneys are normal, without renal calculi, focal  lesion, or hydronephrosis. Bladder is unremarkable. Stomach/Bowel: Stomach is partially distended, unremarkable. Proximal loops of small bowel are nondilated. There are multiple dilated distal small bowel loops. Transition point is in the region of intussusception involving the ileocolic anastomosis. Distal sigmoid colon and rectum are decompressed. Vascular/Lymphatic: No significant vascular findings are present. No enlarged abdominal or pelvic lymph nodes. Reproductive: Enlarged uterus with  peripherally calcified left fundal fibroid. No adnexal mass. Other: No ascites.  No free air. Musculoskeletal: No acute or significant osseous findings. IMPRESSION: 1. High-grade distal small bowel obstruction, transition point in the region of intussusception at the ileocolic anastomosis. Electronically Signed   By: Lucrezia Europe M.D.   On: 01/26/2021 09:49   DG Abd Portable 1V-Small Bowel Obstruction Protocol-initial, 8 hr delay  Result Date: 01/26/2021 CLINICAL DATA:  8 hour delay EXAM: PORTABLE ABDOMEN - 1 VIEW COMPARISON:  01/26/2021, CT 01/26/2021 FINDINGS: Esophageal tube tip overlies the gastric fundus. Persistent gaseous dilatation of bowel measuring up to 5.4 cm. Contrast present within dilated small bowel as well as the rectum. Contrast within the bladder. IMPRESSION: Persistent dilatation of small bowel up to 5.4 cm. Contrast is present within dilated distal small bowel as well as the rectum. Electronically Signed   By: Donavan Foil M.D.   On: 01/26/2021 23:24   DG Abd Portable 1V-Small Bowel Protocol-Position Verification  Result Date: 01/26/2021 CLINICAL DATA:  Tube placement EXAM: PORTABLE ABDOMEN - 1 VIEW COMPARISON:  None. FINDINGS: Esophagogastric tube with tip and side port below the diaphragm, tip in the gastric fundus. Distended loops of bowel in the central abdomen measuring up to 5.4 cm in caliber. No obvious free air. IMPRESSION: 1. Esophagogastric tube with tip and side port below the diaphragm, tip in the gastric fundus. 2. Distended loops of bowel in the central abdomen measuring up to 5.4 cm in caliber. Electronically Signed   By: Delanna Ahmadi M.D.   On: 01/26/2021 13:06    Anti-infectives: Anti-infectives (From admission, onward)    None        Assessment/Plan SBO - [facility] robotic assisted subtotal colectomy with ileo-sigmoid anastomosis 06/27/19 by Dr. Marcello Moores - CT scan 10/19 shows a high-grade distal small bowel obstruction with transition point in the region of  intussusception at the ileocolic anastomosis - GI to scope today to eval if any active disease at anastomosis and eval possible area of intussusception seen on CT. Spoke with their team in person. - Contrast has passed into rectum and she had ROBF. Okay for CLD and NGT clamping after GI procedure.   ID - none VTE - lovenox FEN - IVF, NPO/NGT to LIWS Foley - none   Breast cancer s/p bilateral nipple sparing mastectomy 11/09/20 by [name], currently on immunosuppressant therapy and undergoing radiation treatment Crohn's disease - on mesalamine   LOS: 0 days    Jillyn Ledger , [facility] 01/27/2021, 9:32 AM Please see Amion for pager number during day hours 7:00am-4:30pm

## 2021-01-28 ENCOUNTER — Encounter (HOSPITAL_COMMUNITY): Payer: Self-pay | Admitting: Gastroenterology

## 2021-01-28 ENCOUNTER — Ambulatory Visit: Payer: Managed Care, Other (non HMO)

## 2021-01-28 DIAGNOSIS — K50819 Crohn's disease of both small and large intestine with unspecified complications: Secondary | ICD-10-CM | POA: Diagnosis not present

## 2021-01-28 DIAGNOSIS — K56609 Unspecified intestinal obstruction, unspecified as to partial versus complete obstruction: Secondary | ICD-10-CM | POA: Diagnosis not present

## 2021-01-28 LAB — SURGICAL PATHOLOGY

## 2021-01-28 MED ORDER — DOCUSATE SODIUM 50 MG PO CAPS: 50.0000 mg | ORAL_CAPSULE | Freq: Two times a day (BID) | ORAL | 0 refills | Status: DC | PRN

## 2021-01-28 MED ORDER — POLYETHYLENE GLYCOL 3350 17 G PO PACK: 17.0000 g | PACK | Freq: Every day | ORAL | 0 refills | Status: DC | PRN

## 2021-01-28 MED ORDER — MESALAMINE 1.2 G PO TBEC
2.4000 g | DELAYED_RELEASE_TABLET | Freq: Two times a day (BID) | ORAL | Status: DC
  Filled 2021-01-28: qty 2

## 2021-01-28 NOTE — Progress Notes (Signed)
1 Day Post-Op  Subjective: CC: Patient reports no abdominal pain, n/v. Tolerating cld. Passing flatus. BM yesterday and early this am. Voiding.   Objective: Vital signs in last 24 hours: Temp:  [97.7 F (36.5 C)-98.8 F (37.1 C)] 98.5 F (36.9 C) (10/21 0558) Pulse Rate:  [78-99] 81 (10/21 0558) Resp:  [14-21] 18 (10/21 0558) BP: (113-128)/(74-98) 123/78 (10/21 0558) SpO2:  [98 %-100 %] 99 % (10/21 0558) Weight:  [68.5 kg] 68.5 kg (10/20 1245) Last BM Date: 01/27/21  Intake/Output from previous day: 10/20 0701 - 10/21 0700 In: 3606 [P.O.:1540; I.V.:2066] Out: 600 [Urine:400; Emesis/NG output:200] Intake/Output this shift: No intake/output data recorded.  PE: Gen:  Alert, NAD, pleasant HEENT: EOM's intact, pupils equal and round Card:  RRR Pulm:  CTAB, no W/R/R, effort normal Abd: Soft, ND, NT, +BS Ext:  No LE edema or calf tenderness Psych: A&Ox3  Skin: no rashes noted, warm and dry  Lab Results:  Recent Labs    01/26/21 0734 01/27/21 0437  WBC 10.5 4.9  HGB 13.3 11.0*  HCT 41.7 35.3*  PLT 312 233   BMET Recent Labs    01/26/21 0734 01/27/21 0437  NA 136 139  K 3.9 3.8  CL 100 107  CO2 25 25  GLUCOSE 122* 94  BUN 21* 21*  CREATININE 1.03* 0.98  CALCIUM 9.7 8.7*   PT/INR No results for input(s): LABPROT, INR in the last 72 hours. CMP     Component Value Date/Time   NA 139 01/27/2021 0437   NA 135 01/21/2020 1056   K 3.8 01/27/2021 0437   CL 107 01/27/2021 0437   CO2 25 01/27/2021 0437   GLUCOSE 94 01/27/2021 0437   BUN 21 (H) 01/27/2021 0437   BUN 12 01/21/2020 1056   CREATININE 0.98 01/27/2021 0437   CREATININE 0.79 01/21/2021 0817   CALCIUM 8.7 (L) 01/27/2021 0437   PROT 8.9 (H) 01/26/2021 0734   ALBUMIN 4.7 01/26/2021 0734   AST 25 01/26/2021 0734   AST 17 01/21/2021 0817   ALT 16 01/26/2021 0734   ALT 13 01/21/2021 0817   ALKPHOS 134 (H) 01/26/2021 0734   BILITOT 1.1 01/26/2021 0734   BILITOT 0.5 01/21/2021 0817   GFRNONAA  >60 01/27/2021 0437   GFRNONAA >60 01/21/2021 0817   GFRAA 108 01/21/2020 1056   Lipase     Component Value Date/Time   LIPASE 47 01/26/2021 0734    Studies/Results: CT Abdomen Pelvis W Contrast  Result Date: 01/26/2021 CLINICAL DATA:  Abdominal pain EXAM: CT ABDOMEN AND PELVIS WITH CONTRAST TECHNIQUE: Multidetector CT imaging of the abdomen and pelvis was performed using the standard protocol following bolus administration of intravenous contrast. CONTRAST:  46m OMNIPAQUE IOHEXOL 350 MG/ML SOLN COMPARISON:  06/30/2020 FINDINGS: Lower chest: Bilateral breast implants. No pleural or pericardial effusion. Visualized lung bases clear. Hepatobiliary: No focal liver abnormality is seen. No gallstones, gallbladder wall thickening, or biliary dilatation. Pancreas: Unremarkable. No pancreatic ductal dilatation or surrounding inflammatory changes. Spleen: Normal in size without focal abnormality. Adrenals/Urinary Tract: Adrenal glands are unremarkable. Kidneys are normal, without renal calculi, focal lesion, or hydronephrosis. Bladder is unremarkable. Stomach/Bowel: Stomach is partially distended, unremarkable. Proximal loops of small bowel are nondilated. There are multiple dilated distal small bowel loops. Transition point is in the region of intussusception involving the ileocolic anastomosis. Distal sigmoid colon and rectum are decompressed. Vascular/Lymphatic: No significant vascular findings are present. No enlarged abdominal or pelvic lymph nodes. Reproductive: Enlarged uterus with peripherally calcified left fundal  fibroid. No adnexal mass. Other: No ascites.  No free air. Musculoskeletal: No acute or significant osseous findings. IMPRESSION: 1. High-grade distal small bowel obstruction, transition point in the region of intussusception at the ileocolic anastomosis. Electronically Signed   By: Lucrezia Europe M.D.   On: 01/26/2021 09:49   DG Abd Portable 1V-Small Bowel Obstruction Protocol-initial, 8 hr  delay  Result Date: 01/26/2021 CLINICAL DATA:  8 hour delay EXAM: PORTABLE ABDOMEN - 1 VIEW COMPARISON:  01/26/2021, CT 01/26/2021 FINDINGS: Esophageal tube tip overlies the gastric fundus. Persistent gaseous dilatation of bowel measuring up to 5.4 cm. Contrast present within dilated small bowel as well as the rectum. Contrast within the bladder. IMPRESSION: Persistent dilatation of small bowel up to 5.4 cm. Contrast is present within dilated distal small bowel as well as the rectum. Electronically Signed   By: Donavan Foil M.D.   On: 01/26/2021 23:24   DG Abd Portable 1V-Small Bowel Protocol-Position Verification  Result Date: 01/26/2021 CLINICAL DATA:  Tube placement EXAM: PORTABLE ABDOMEN - 1 VIEW COMPARISON:  None. FINDINGS: Esophagogastric tube with tip and side port below the diaphragm, tip in the gastric fundus. Distended loops of bowel in the central abdomen measuring up to 5.4 cm in caliber. No obvious free air. IMPRESSION: 1. Esophagogastric tube with tip and side port below the diaphragm, tip in the gastric fundus. 2. Distended loops of bowel in the central abdomen measuring up to 5.4 cm in caliber. Electronically Signed   By: Delanna Ahmadi M.D.   On: 01/26/2021 13:06    Anti-infectives: Anti-infectives (From admission, onward)    None        Assessment/Plan SBO - PSH robotic assisted subtotal colectomy with ileo-sigmoid anastomosis 06/27/19 by Dr. Marcello Moores - CT scan 10/19 shows a high-grade distal small bowel obstruction with transition point in the region of intussusception at the ileocolic anastomosis - GI scoped and no focal pathology noted. Anastomosis appears patent. Mild scattered inflammatory changes of the ileum of unclear significance. Path pending.  - Contrast has passed into rectum on xray. She is tolerating cld w/ ROBF. Adv diet. Plan for d/c later today.    ID - none VTE - lovenox FEN - FLD, AAT to Soft diet  Foley - none   Breast cancer s/p bilateral nipple  sparing mastectomy 11/09/20 by [name], currently on immunosuppressant therapy and undergoing radiation treatment - Radiation Oncology to see again today per their notes  Crohn's disease - on mesalamine   LOS: 0 days    Jillyn Ledger , [facility] 01/28/2021, 8:38 AM Please see Amion for pager number during day hours 7:00am-4:30pm

## 2021-01-28 NOTE — Progress Notes (Signed)
Patient was given discharge instructions, and all questions were answered. Patient was stable for discharge and was walked to the ED exit where her car was parked.

## 2021-01-28 NOTE — Discharge Instructions (Signed)
Recommend continuing soft/ low fiber diet for the next couple of weeks, then gradually return to a high fiber diet

## 2021-01-28 NOTE — Progress Notes (Signed)
[facility] Progress Note  CC:  Crohn's disease, SBO  Subjective:  Feel much better.  Passing flatus and having BMs.  No abdominal pain, nausea, vomiting.  Tolerated soft diet.  Flex sig on 10/20:  - Preparation of the colon was fair, time spent to lavage the colon to complete the exam. - Patient ileo-sigmoid surgical anastomosis which appeared healthy. - Ileum deeply intubated - some scattered areas of small erosions and nonspecific mild erythema but no significant inflammation, no stenosis / stricture / mass lesions appreciated. Ileal biopsies obtained - Erythematous mucosa in the distal rectum. Biopsied. - Colon otherwise normal but views limited in the colon due to prep. Unclear cause of obstruction - no focal pathology noted on the exam to cause this.  Anastomosis appears patent. Mild scattered inflammatory changes of the ileum of unclear significance.   Objective:  Vital signs in last 24 hours: Temp:  [97.7 F (36.5 C)-98.8 F (37.1 C)] 98.5 F (36.9 C) (10/21 0558) Pulse Rate:  [78-99] 81 (10/21 0558) Resp:  [14-21] 18 (10/21 0558) BP: (113-128)/(74-98) 123/78 (10/21 0558) SpO2:  [98 %-100 %] 99 % (10/21 0558) Weight:  [68.5 kg] 68.5 kg (10/20 1245) Last BM Date: 01/28/21 General:  Alert, Well-developed, in NAD Heart:  Regular rate and rhythm; no murmurs Pulm:  CTAB.  No W/R/R. Abdomen:  Soft, non-distended.  BS present.  Non-tender. Extremities:  Without edema. Neurologic:  Alert and oriented x 4;  grossly normal neurologically. Psych:  Alert and cooperative. Normal mood and affect.  Intake/Output from previous day: 10/20 0701 - 10/21 0700 In: 3606 [P.O.:1540; I.V.:2066] Out: 600 [Urine:400; Emesis/NG output:200]  Lab Results: Recent Labs    01/26/21 0734 01/27/21 0437  WBC 10.5 4.9  HGB 13.3 11.0*  HCT 41.7 35.3*  PLT 312 233   BMET Recent Labs    01/26/21 0734 01/27/21 0437  NA 136 139  K 3.9 3.8  CL 100 107  CO2 25 25   GLUCOSE 122* 94  BUN 21* 21*  CREATININE 1.03* 0.98  CALCIUM 9.7 8.7*   LFT Recent Labs    01/26/21 0734  PROT 8.9*  ALBUMIN 4.7  AST 25  ALT 16  ALKPHOS 134*  BILITOT 1.1   DG Abd Portable 1V-Small Bowel Obstruction Protocol-initial, 8 hr delay  Result Date: 01/26/2021 CLINICAL DATA:  8 hour delay EXAM: PORTABLE ABDOMEN - 1 VIEW COMPARISON:  01/26/2021, CT 01/26/2021 FINDINGS: Esophageal tube tip overlies the gastric fundus. Persistent gaseous dilatation of bowel measuring up to 5.4 cm. Contrast present within dilated small bowel as well as the rectum. Contrast within the bladder. IMPRESSION: Persistent dilatation of small bowel up to 5.4 cm. Contrast is present within dilated distal small bowel as well as the rectum. Electronically Signed   By: Donavan Foil M.D.   On: 01/26/2021 23:24   DG Abd Portable 1V-Small Bowel Protocol-Position Verification  Result Date: 01/26/2021 CLINICAL DATA:  Tube placement EXAM: PORTABLE ABDOMEN - 1 VIEW COMPARISON:  None. FINDINGS: Esophagogastric tube with tip and side port below the diaphragm, tip in the gastric fundus. Distended loops of bowel in the central abdomen measuring up to 5.4 cm in caliber. No obvious free air. IMPRESSION: 1. Esophagogastric tube with tip and side port below the diaphragm, tip in the gastric fundus. 2. Distended loops of bowel in the central abdomen measuring up to 5.4 cm in caliber. Electronically Signed   By: Delanna Ahmadi M.D.   On: 01/26/2021 13:06    Assessment /  Plan: *High-grade distal small bowel obstruction with transition point in the region of intussusception at the ileocolic anastomosis.  Flex sig 10/20 showed no focal pathology noted. Anastomosis appears patent. Mild scattered inflammatory changes of the ileum of unclear significance. Path pending.  *Crohn's colitis status post subtotal colectomy with ileal to sigmoid anastomosis in March 2021 by Dr. Marcello Moores.  Has been maintained on mesalamine 4.8 g daily.  She  had diffuse inflammation in the distal rectum in 06/2020 that was thought to be drug-induced from her Docetaxil. *Breast cancer s/p bilateral nipple sparing mastectomy 11/09/20 by [name], currently on immunosuppressant therapy and undergoing radiation treatment  -She is feeling better.  Going to be discharged today.  We will contact her with biopsy results and any further plans regarding her care.   LOS: 0 days   Laban Emperor. Loveah Like  01/28/2021, 9:49 AM

## 2021-01-28 NOTE — Discharge Summary (Signed)
[facility] Discharge Summary   Patient ID: [name] MRN: [id number] DOB/AGE: 10/27/1975 45 y.o.  Admit date: 01/26/2021 Discharge date: 01/28/2021  Admitting Diagnosis: Bowel obstruction  Discharge Diagnosis Patient Active Problem List   Diagnosis Date Noted   Crohn's disease of small and large intestines with complication ([place])    Small bowel obstruction ([place]) 01/26/2021   History of Crohn's disease    Breast cancer ([place]) 11/09/2020   Port-A-Cath in place 07/15/2020   Genetic testing 06/25/2020   Family history of breast cancer 06/11/2020   Malignant neoplasm of upper-outer quadrant of right breast in female, estrogen receptor positive ([place]) 06/04/2020   Abnormal finding present on diagnostic imaging of uterus 05/07/2019   Vitamin D deficiency 09/24/2013   Crohn's colitis, with intestinal obstruction ([place]) 09/17/2013   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 10/17/2010    Consultants Gastroenterology  Imaging: DG Abd Portable 1V-Small Bowel Obstruction Protocol-initial, 8 hr delay  Result Date: 01/26/2021 CLINICAL DATA:  8 hour delay EXAM: PORTABLE ABDOMEN - 1 VIEW COMPARISON:  01/26/2021, CT 01/26/2021 FINDINGS: Esophageal tube tip overlies the gastric fundus. Persistent gaseous dilatation of bowel measuring up to 5.4 cm. Contrast present within dilated small bowel as well as the rectum. Contrast within the bladder. IMPRESSION: Persistent dilatation of small bowel up to 5.4 cm. Contrast is present within dilated distal small bowel as well as the rectum. Electronically Signed   By: Donavan Foil M.D.   On: 01/26/2021 23:24   DG Abd Portable 1V-Small Bowel Protocol-Position Verification  Result Date: 01/26/2021 CLINICAL DATA:  Tube placement EXAM: PORTABLE ABDOMEN - 1 VIEW COMPARISON:  None. FINDINGS: Esophagogastric tube with tip and side port below the diaphragm, tip in the gastric fundus. Distended loops of bowel  in the central abdomen measuring up to 5.4 cm in caliber. No obvious free air. IMPRESSION: 1. Esophagogastric tube with tip and side port below the diaphragm, tip in the gastric fundus. 2. Distended loops of bowel in the central abdomen measuring up to 5.4 cm in caliber. Electronically Signed   By: Delanna Ahmadi M.D.   On: 01/26/2021 13:06    Procedures [name] (01/27/2021) - Flexible sigmoidoscopy  Hospital Course:  [name] is a 45yo female PMH breast cancer s/p bilateral nipple sparing mastectomy 11/09/20 by [name] and currently on immunosuppressant therapy and undergoing radiation treatment, as well as a h/o Crohns disease s/p robotic assisted subtotal colectomy with ileo-sigmoid anastomosis 06/27/19 by Dr. Marcello Moores, who presented to [facility] today complaining of abdominal pain. In the ED she underwent CT scan which shows a high-grade distal small bowel obstruction with transition point in the region of intussusception at the ileocolic anastomosis.  Patient was admitted to the surgical service. NG tube was placed and she was started on the small bowel obstruction protocol. Gastroenterology was consulted and took the patient for flex sig on 10/20 to evaluate intussusception at the ileocolic anastomosis seen on CT scan. Ileo-sigmoid surgical anastomosis appeared healthy; some scattered areas of small erosions and nonspecific mild erythema but no significant inflammation, no stenosis / stricture / mass lesions appreciated; Ileal biopsies obtained; erythematous mucosa in the distal rectum also biopsied.  Delayed film showed contrast in the colon and patient's bowel function returned. NG tube was removed and diet advanced as tolerated.  On 01/28/21 the patient was tolerating diet, having bowel function, abdominal pain resolved, vital signs stable and felt stable for discharge home.  Patient will follow up as  below and knows to call with questions or concerns.      Allergies as of 01/28/2021        Reactions   Other    BLOOD PRODUCT REFUSAL        Medication List     TAKE these medications    acetaminophen 500 MG tablet Commonly known as: TYLENOL Take 500-1,000 mg by mouth every 6 (six) hours as needed for mild pain.   docusate sodium 50 MG capsule Commonly known as: COLACE Take 1 capsule (50 mg total) by mouth 2 (two) times daily as needed for mild constipation.   lidocaine-prilocaine cream Commonly known as: EMLA Apply 1 application topically as needed (Prior to port access).   mesalamine 1.2 g EC tablet Commonly known as: Lialda Take 2 tablets (2.4 g total) by mouth 2 (two) times daily with a meal.   OVER THE COUNTER MEDICATION Take 2 tablets by mouth daily.   polyethylene glycol 17 g packet Commonly known as: MiraLax Take 17 g by mouth daily as needed.          Follow-up Information     [facility], [facility] Follow up.   Specialty: General Surgery Why: As needed Contact information: [address] [place] [address] [phone]                 Signed: Wellington Hampshire, [facility] 01/28/2021, 12:31 PM Please see Amion for pager number during day hours 7:00am-4:30pm

## 2021-01-31 ENCOUNTER — Ambulatory Visit: Payer: Managed Care, Other (non HMO)

## 2021-01-31 LAB — CULTURE, BLOOD (SINGLE): Culture: NO GROWTH

## 2021-02-01 ENCOUNTER — Ambulatory Visit: Payer: Managed Care, Other (non HMO)

## 2021-02-01 ENCOUNTER — Ambulatory Visit: Admission: RE | Admit: 2021-02-01 | Payer: Managed Care, Other (non HMO) | Source: Ambulatory Visit

## 2021-02-01 ENCOUNTER — Telehealth: Payer: Self-pay | Admitting: Radiation Oncology

## 2021-02-01 NOTE — Telephone Encounter (Signed)
I called and spoke with the patient this afternoon.  Thankfully she is now home from her recent hospitalization for small bowel obstruction.  It is notable that imaging during this timeframe showed a transition point at the level of her anastomosis from her prior surgery for Crohn's disease.  She fortunately recovered from her obstruction with bowel rest NG tube decompression and IV hydration.  She had been asked by our staff if she was having any symptoms in the abdominal region  (for what reason we're not sure of) last week which at that time she did not have. However within 24 hours, she developed nausea ,vomiting, and abdominal pain. She was concerned that there was a connection between the small bowel obstruction and her ongoing radiotherapy and last week while she had been hospitalized I had tried calling her but was unsuccessful at reaching her.  Dr. Lisbeth Renshaw did go up and visit with her.  After talking to the the provider who saw her for her undertreatment visit on 02/24/2021 the day that she had been asked about abdominal symptoms in our clinic, it was not felt that there was an overwhelming concern for any specific side effects at that time that would have been reason to worry about her abdomen.  I spent time today on the phone explaining to the patient that given the location of her treatment it would be highly unusual to have any gastrointestinal side effects.  We did discuss that left-sided breast cancer patients who are obese with large breast tissue can sometimes experience enteritis due to anatomy and overlap of their treatment.  Her case however is much different based on anatomy and location.  She is quite curious about whether or not Dr. Lisbeth Renshaw would be willing to reduce her daily dose of radiotherapy as she understands the benefits of local control with radiotherapy.  We discussed again that radiotherapy recommendations are based on clinical diagnosis for node positive patients regardless of  neoadjuvant chemotherapeutic response.  She would like to have further discussion with Dr. Lisbeth Renshaw as well prior to resuming.  We will contact her tomorrow at 1:00 in the afternoon so that he is able to join the discussion.

## 2021-02-02 ENCOUNTER — Ambulatory Visit: Payer: Managed Care, Other (non HMO)

## 2021-02-02 ENCOUNTER — Telehealth: Payer: Self-pay | Admitting: Radiation Oncology

## 2021-02-02 NOTE — Telephone Encounter (Signed)
I called the patient and she is going to try to make some continued dietary changes and read up a bit more on foods that can trigger obstructive symptoms. She is now moore comfortable understanding the risks of obstruction and how diet can influence how people can experience obstructive risks. She is willing to keep a soft or mostly full liquid diet while re-attempting radiation. She does feel strongly though that if her symptoms of obstruction occurred she would not continue with treatment. Dr. Lisbeth Renshaw also joined the call and all are in agreement with this plan to start back tomorrow.

## 2021-02-03 ENCOUNTER — Ambulatory Visit
Admission: RE | Admit: 2021-02-03 | Discharge: 2021-02-03 | Disposition: A | Discharge status: Home / Self Care | Payer: Managed Care, Other (non HMO) | Source: Ambulatory Visit | Attending: Radiation Oncology | Admitting: Radiation Oncology

## 2021-02-03 ENCOUNTER — Other Ambulatory Visit: Payer: Self-pay

## 2021-02-03 DIAGNOSIS — Z5112 Encounter for antineoplastic immunotherapy: Secondary | ICD-10-CM | POA: Diagnosis not present

## 2021-02-04 ENCOUNTER — Ambulatory Visit
Admission: RE | Admit: 2021-02-04 | Discharge: 2021-02-04 | Disposition: A | Discharge status: Home / Self Care | Payer: Managed Care, Other (non HMO) | Source: Ambulatory Visit | Attending: Radiation Oncology | Admitting: Radiation Oncology

## 2021-02-04 ENCOUNTER — Ambulatory Visit: Payer: Managed Care, Other (non HMO) | Admitting: Radiation Oncology

## 2021-02-04 DIAGNOSIS — Z5112 Encounter for antineoplastic immunotherapy: Secondary | ICD-10-CM | POA: Diagnosis not present

## 2021-02-07 ENCOUNTER — Other Ambulatory Visit: Payer: Self-pay

## 2021-02-07 ENCOUNTER — Ambulatory Visit
Admission: RE | Admit: 2021-02-07 | Discharge: 2021-02-07 | Disposition: A | Discharge status: Home / Self Care | Payer: Managed Care, Other (non HMO) | Source: Ambulatory Visit | Attending: Radiation Oncology | Admitting: Radiation Oncology

## 2021-02-07 DIAGNOSIS — Z5112 Encounter for antineoplastic immunotherapy: Secondary | ICD-10-CM | POA: Diagnosis not present

## 2021-02-08 ENCOUNTER — Ambulatory Visit
Admission: RE | Admit: 2021-02-08 | Discharge: 2021-02-08 | Disposition: A | Discharge status: Home / Self Care | Payer: Managed Care, Other (non HMO) | Source: Ambulatory Visit | Attending: Radiation Oncology | Admitting: Radiation Oncology

## 2021-02-08 DIAGNOSIS — C50411 Malignant neoplasm of upper-outer quadrant of right female breast: Secondary | ICD-10-CM | POA: Insufficient documentation

## 2021-02-08 DIAGNOSIS — Z170 Estrogen receptor positive status [ER+]: Secondary | ICD-10-CM | POA: Diagnosis not present

## 2021-02-08 DIAGNOSIS — Z510 Encounter for antineoplastic radiation therapy: Secondary | ICD-10-CM | POA: Insufficient documentation

## 2021-02-08 DIAGNOSIS — Z5112 Encounter for antineoplastic immunotherapy: Secondary | ICD-10-CM | POA: Diagnosis not present

## 2021-02-09 ENCOUNTER — Other Ambulatory Visit: Payer: Self-pay

## 2021-02-09 ENCOUNTER — Ambulatory Visit
Admission: RE | Admit: 2021-02-09 | Discharge: 2021-02-09 | Disposition: A | Discharge status: Home / Self Care | Payer: Managed Care, Other (non HMO) | Source: Ambulatory Visit | Attending: Radiation Oncology | Admitting: Radiation Oncology

## 2021-02-09 DIAGNOSIS — Z5112 Encounter for antineoplastic immunotherapy: Secondary | ICD-10-CM | POA: Diagnosis not present

## 2021-02-10 ENCOUNTER — Inpatient Hospital Stay: Payer: Managed Care, Other (non HMO) | Attending: Hematology and Oncology

## 2021-02-10 ENCOUNTER — Inpatient Hospital Stay: Payer: Managed Care, Other (non HMO)

## 2021-02-10 ENCOUNTER — Ambulatory Visit
Admission: RE | Admit: 2021-02-10 | Discharge: 2021-02-10 | Disposition: A | Discharge status: Home / Self Care | Payer: Managed Care, Other (non HMO) | Source: Ambulatory Visit | Attending: Radiation Oncology | Admitting: Radiation Oncology

## 2021-02-10 ENCOUNTER — Encounter: Payer: Self-pay | Admitting: Adult Health

## 2021-02-10 ENCOUNTER — Inpatient Hospital Stay (HOSPITAL_BASED_OUTPATIENT_CLINIC_OR_DEPARTMENT_OTHER): Payer: Managed Care, Other (non HMO) | Admitting: Adult Health

## 2021-02-10 VITALS — BP 134/80 | HR 91 | Temp 97.5°F | Resp 18 | Ht 64.0 in | Wt 150.9 lb

## 2021-02-10 DIAGNOSIS — Z170 Estrogen receptor positive status [ER+]: Secondary | ICD-10-CM | POA: Diagnosis not present

## 2021-02-10 DIAGNOSIS — Z95828 Presence of other vascular implants and grafts: Secondary | ICD-10-CM

## 2021-02-10 DIAGNOSIS — Z5112 Encounter for antineoplastic immunotherapy: Secondary | ICD-10-CM | POA: Diagnosis not present

## 2021-02-10 DIAGNOSIS — C50411 Malignant neoplasm of upper-outer quadrant of right female breast: Secondary | ICD-10-CM

## 2021-02-10 DIAGNOSIS — Z510 Encounter for antineoplastic radiation therapy: Secondary | ICD-10-CM | POA: Insufficient documentation

## 2021-02-10 LAB — CBC WITH DIFFERENTIAL/PLATELET
Abs Immature Granulocytes: 0.02 10*3/uL (ref 0.00–0.07)
Basophils Absolute: 0 10*3/uL (ref 0.0–0.1)
Basophils Relative: 1 %
Eosinophils Absolute: 0 10*3/uL (ref 0.0–0.5)
Eosinophils Relative: 1 %
HCT: 32.5 % — ABNORMAL LOW (ref 36.0–46.0)
Hemoglobin: 10.3 g/dL — ABNORMAL LOW (ref 12.0–15.0)
Immature Granulocytes: 0 %
Lymphocytes Relative: 12 %
Lymphs Abs: 0.7 10*3/uL (ref 0.7–4.0)
MCH: 26.1 pg (ref 26.0–34.0)
MCHC: 31.7 g/dL (ref 30.0–36.0)
MCV: 82.5 fL (ref 80.0–100.0)
Monocytes Absolute: 0.4 10*3/uL (ref 0.1–1.0)
Monocytes Relative: 6 %
Neutro Abs: 5.2 10*3/uL (ref 1.7–7.7)
Neutrophils Relative %: 80 %
Platelets: 197 10*3/uL (ref 150–400)
RBC: 3.94 MIL/uL (ref 3.87–5.11)
RDW: 13.8 % (ref 11.5–15.5)
WBC: 6.4 10*3/uL (ref 4.0–10.5)
nRBC: 0 % (ref 0.0–0.2)

## 2021-02-10 LAB — COMPREHENSIVE METABOLIC PANEL
ALT: 11 U/L (ref 0–44)
AST: 19 U/L (ref 15–41)
Albumin: 3.5 g/dL (ref 3.5–5.0)
Alkaline Phosphatase: 113 U/L (ref 38–126)
Anion gap: 12 (ref 5–15)
BUN: 9 mg/dL (ref 6–20)
CO2: 20 mmol/L — ABNORMAL LOW (ref 22–32)
Calcium: 8.6 mg/dL — ABNORMAL LOW (ref 8.9–10.3)
Chloride: 109 mmol/L (ref 98–111)
Creatinine, Ser: 0.76 mg/dL (ref 0.44–1.00)
GFR, Estimated: >60 mL/min (ref >60)
Glucose, Bld: 89 mg/dL (ref 70–99)
Potassium: 4.1 mmol/L (ref 3.5–5.1)
Sodium: 141 mmol/L (ref 135–145)
Total Bilirubin: 0.3 mg/dL (ref 0.3–1.2)
Total Protein: 6.9 g/dL (ref 6.5–8.1)

## 2021-02-10 LAB — MAGNESIUM: Magnesium: 1.9 mg/dL (ref 1.7–2.4)

## 2021-02-10 MED ORDER — ACETAMINOPHEN 325 MG PO TABS
650.0000 mg | ORAL_TABLET | Freq: Once | ORAL | Status: AC
  Administered 2021-02-10: 650 mg via ORAL
  Filled 2021-02-10: qty 2

## 2021-02-10 MED ORDER — DIPHENHYDRAMINE HCL 25 MG PO CAPS
25.0000 mg | ORAL_CAPSULE | Freq: Once | ORAL | Status: AC
  Administered 2021-02-10: 25 mg via ORAL
  Filled 2021-02-10: qty 1

## 2021-02-10 MED ORDER — SODIUM CHLORIDE 0.9 % IV SOLN
420.0000 mg | Freq: Once | INTRAVENOUS | Status: AC
  Administered 2021-02-10: 420 mg via INTRAVENOUS
  Filled 2021-02-10: qty 14

## 2021-02-10 MED ORDER — SODIUM CHLORIDE 0.9 % IV SOLN: Freq: Once | INTRAVENOUS | Status: AC

## 2021-02-10 MED ORDER — HEPARIN SOD (PORK) LOCK FLUSH 100 UNIT/ML IV SOLN
500.0000 [IU] | Freq: Once | INTRAVENOUS | Status: AC | PRN
  Administered 2021-02-10: 500 [IU]

## 2021-02-10 MED ORDER — TRASTUZUMAB-ANNS CHEMO 150 MG IV SOLR
6.0000 mg/kg | Freq: Once | INTRAVENOUS | Status: AC
  Administered 2021-02-10: 399 mg via INTRAVENOUS
  Filled 2021-02-10: qty 19

## 2021-02-10 MED ORDER — SODIUM CHLORIDE 0.9% FLUSH
10.0000 mL | Freq: Once | INTRAVENOUS | Status: AC
  Administered 2021-02-10: 10 mL

## 2021-02-10 MED ORDER — SODIUM CHLORIDE 0.9% FLUSH
10.0000 mL | INTRAVENOUS | Status: DC | PRN
  Administered 2021-02-10: 10 mL

## 2021-02-10 NOTE — Progress Notes (Signed)
Patient declined to stay for 30 minute observation after perjeta. Patient in no distress upon leaving infusion room.

## 2021-02-10 NOTE — Patient Instructions (Signed)
[facility]   Discharge Instructions: Thank you for choosing [facility] to provide your oncology and hematology care.   If you have a lab appointment with the [facility], please go directly to the [facility] and check in at the registration area.   Wear comfortable clothing and clothing appropriate for easy access to any Portacath or PICC line.   We strive to give you quality time with your provider. You may need to reschedule your appointment if you arrive late (15 or more minutes).  Arriving late affects you and other patients whose appointments are after yours.  Also, if you miss three or more appointments without notifying the office, you may be dismissed from the clinic at the provider's discretion.      For prescription refill requests, have your pharmacy contact our office and allow 72 hours for refills to be completed.    Today you received the following chemotherapy and/or immunotherapy agents: Trastuzumab (Herceptin) and Pertuzumab (Perjeta)      To help prevent nausea and vomiting after your treatment, we encourage you to take your nausea medication as directed.  BELOW ARE SYMPTOMS THAT SHOULD BE REPORTED IMMEDIATELY: *FEVER GREATER THAN 100.4 F (38 C) OR HIGHER *CHILLS OR SWEATING *NAUSEA AND VOMITING THAT IS NOT CONTROLLED WITH YOUR NAUSEA MEDICATION *UNUSUAL SHORTNESS OF BREATH *UNUSUAL BRUISING OR BLEEDING *URINARY PROBLEMS (pain or burning when urinating, or frequent urination) *BOWEL PROBLEMS (unusual diarrhea, constipation, pain near the anus) TENDERNESS IN MOUTH AND THROAT WITH OR WITHOUT PRESENCE OF ULCERS (sore throat, sores in mouth, or a toothache) UNUSUAL RASH, SWELLING OR PAIN  UNUSUAL VAGINAL DISCHARGE OR ITCHING   Items with * indicate a potential emergency and should be followed up as soon as possible or go to the Emergency Department if any problems should occur.  Please show the CHEMOTHERAPY ALERT CARD or  IMMUNOTHERAPY ALERT CARD at check-in to the Emergency Department and triage nurse.  Should you have questions after your visit or need to cancel or reschedule your appointment, please contact [facility]  Dept: [phone]  and follow the prompts.  Office hours are 8:00 a.m. to 4:30 p.m. Monday - Friday. Please note that voicemails left after 4:00 p.m. may not be returned until the following business day.  We are closed weekends and major holidays. You have access to a nurse at all times for urgent questions. Please call the main number to the clinic Dept: [phone] and follow the prompts.   For any non-urgent questions, you may also contact your provider using MyChart. We now offer e-Visits for anyone 73 and older to request care online for non-urgent symptoms. For details visit [email or web address].   Also download the MyChart app! Go to the app store, search "MyChart", open the app, select [facility], and log in with your MyChart username and password.  Due to Covid, a mask is required upon entering the hospital/clinic. If you do not have a mask, one will be given to you upon arrival. For doctor visits, patients may have 1 support person aged 68 or older with them. For treatment visits, patients cannot have anyone with them due to current Covid guidelines and our immunocompromised population.

## 2021-02-10 NOTE — Progress Notes (Signed)
[facility] Follow up:    Patient, [name] (Inactive) No address on file   DIAGNOSIS: Cancer Staging Malignant neoplasm of upper-outer quadrant of right breast in female, estrogen receptor positive ([place]) Staging form: Breast, AJCC 8th Edition - Clinical: Stage IIIB (cT4d, cN1, cM0, G3, ER+, PR-, HER2+) - Signed by [name], MD on 07/07/2020 Histologic grading system: 3 grade system   SUMMARY OF ONCOLOGIC HISTORY: Oncology History  Malignant neoplasm of upper-outer quadrant of right breast in female, estrogen receptor positive ([place])  06/03/2020 Mammogram   06/03/2020, US guided biopsy of the right breast 9 0 clock mass showed grade III IDC; Prognostics showed ER 40% positive, weak staining, PR positive 0 %, Her 2 3 +, Ki 67 30%   06/04/2020 Initial Diagnosis   T4dCN1M0 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 palpable right breast mass measuring about 7 cm x 5 and half centimeters, palpable right axillary lymph node with concern for skin invasion    06/08/2020 Cancer Staging   Staging form: Breast, AJCC 8th Edition - Clinical: Stage IIIB (cT4d, cN1, cM0, G3, ER+, PR-, HER2+) - Signed by [name], MD on 07/07/2020 Histologic grading system: 3 grade system    06/24/2020 Genetic Testing   No pathogenic variants detected in [facility] +RNAinsight.  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  The report date is June 24, 2020.   The CustomNext-Cancer+RNAinsight panel offered by [facility] includes sequencing and rearrangement analysis for the following 47 genes:  APC, ATM, AXIN2, BARD1, BMPR1A, BRCA1, BRCA2, BRIP1, CDH1, CDK4, CDKN2A, CHEK2, DICER1, EPCAM, GREM1, HOXB13, MEN1, MLH1, MSH2, MSH3, MSH6, MUTYH, NBN, NF1, NF2, NTHL1, PALB2, PMS2, POLD1, POLE, PTEN, RAD51C, RAD51D, RECQL, RET, SDHA, SDHAF2, SDHB, SDHC, SDHD, SMAD4, SMARCA4, STK11, TP53, TSC1, TSC2, and VHL.  RNA data is routinely analyzed for use in  variant interpretation for all genes.   06/25/2020 - 10/29/2020 Chemotherapy   TCHP x6 cycles   11/09/2020 Surgery   Bilateral mastectomies: Left mastectomy: Benign Right mastectomy: Residual microinvasive cancer status post neoadjuvant therapy, DCIS, 0/12 lymph nodes, ER +1%, PR negative, HER2 equivocal 2+ by [facility]   11/19/2020 -  Chemotherapy   Patient is on Treatment Plan : BREAST Trastuzumab  + Pertuzumab q21d x 13 cycles       CURRENT THERAPY: Herceptin/Perjeta  INTERVAL HISTORY: [name] 45 y.o. female returns for evaluation prior to receiving her maitnenance Herceptin Perjeta.  Her most recent echo was completed on 10/5/20225 and showed a normal EF of 60-65%.    [name] tells me that she is doing quite well today.  She notes that she has remained active and is working.  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  She has no stomach issues today.  No nausea vomiting or diarrhea.  She continues to receive adjuvant radiation and will continue this through the middle of this month.  She does have some mild skin irritation under her right arm.  Otherwise she is doing quite well and has no questions or concerns today.   Patient Active Problem List   Diagnosis Date Noted   Crohn's disease of small and large intestines with complication ([place])    Small bowel obstruction ([place]) 01/26/2021   History of Crohn's disease    Breast cancer ([place]) 11/09/2020   Port-A-Cath in place 07/15/2020   Genetic testing 06/25/2020   Family history of breast cancer 06/11/2020   Malignant neoplasm of upper-outer quadrant of right breast  in female, estrogen receptor positive ([place]) 06/04/2020   Abnormal finding present on diagnostic imaging of uterus 05/07/2019   Vitamin D deficiency 09/24/2013   Crohn's colitis, with intestinal obstruction ([place]) 09/17/2013   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 10/17/2010    is allergic to other.  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[place]) 05/07/2019   Crohn's disease ([place])    Family history of breast cancer 06/11/2020   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 10/17/2010   Rectal bleeding    Uterine fibroid    Vitamin D deficiency 09/24/2013    SURGICAL HISTORY: Past Surgical History:  Procedure Laterality Date   AXILLARY LYMPH NODE DISSECTION Right 11/09/2020   Procedure: RIGHT AXILLARY LYMPH NODE DISSECTION;  Surgeon: [name], MD;  Location: [place] [facility];  Service: General;  Laterality: Right;   BIOPSY  07/01/2020   Procedure: BIOPSY;  Surgeon: Yetta Flock, MD;  [facility];  Service: Gastroenterology;;   BIOPSY  01/27/2021   Procedure: BIOPSY;  Surgeon: Yetta Flock, MD;  [facility];  Service: Gastroenterology;;   BREAST RECONSTRUCTION WITH PLACEMENT OF TISSUE EXPANDER AND FLEX HD (ACELLULAR HYDRATED DERMIS) Bilateral 11/09/2020   Procedure: BILATERAL BREAST RECONSTRUCTION WITH PLACEMENT OF TISSUE EXPANDER AND FLEX HD (ACELLULAR HYDRATED DERMIS);  Surgeon: [name], MD;  Location: [facility];  Service: Plastics;  Laterality: Bilateral;   COLECTOMY  06/27/2019   COLONOSCOPY  2015   FLEXIBLE SIGMOIDOSCOPY N/A 07/01/2020   Procedure: FLEXIBLE SIGMOIDOSCOPY;  Surgeon: Yetta Flock, MD;  [facility];  Service: Gastroenterology;  Laterality: N/A;   FLEXIBLE SIGMOIDOSCOPY N/A 01/27/2021   Procedure: FLEXIBLE SIGMOIDOSCOPY;  Surgeon: Yetta Flock, MD;  [facility];  Service: Gastroenterology;  Laterality: N/A;   NIPPLE SPARING MASTECTOMY Bilateral 11/09/2020   Procedure: BILATERAL NIPPLE SPARING MASTECTOMY;  Surgeon: [name], MD;  Location: [place];  Service: General;  Laterality: Bilateral;   PORTACATH PLACEMENT N/A 06/23/2020   Procedure: INSERTION PORT-A-CATH;  Surgeon: [name], MD;  Location: [place];  Service: General;  Laterality: N/A;  START TIME OF 11:00 AM FOR 60 MINUTES [place] [facility]    SOCIAL HISTORY: Social History   Socioeconomic History   Marital status: Significant Other    Spouse name: Not on file   Number of children: 1   Years of education: 13   Highest education level: Not on file  Occupational History   Occupation: Medical Coder   Tobacco Use   Smoking status: Former    Types: Cigarettes   Smokeless tobacco: Never  Vaping Use   Vaping Use: Never used  Substance and Sexual Activity   Alcohol use: Yes    Alcohol/week: 1.0 - 2.0 standard drink    Types: 1 - 2 Glasses of wine per week   Drug use: No   Sexual activity: Yes    Partners: Male    Comment: Intermittent protection not consistent  Other Topics Concern   Not on file  Social History Narrative   Single, 1 daughter born 2001 approximately   Medical coder for [employer]   She is a former smoker, occasional alcohol no drug use   Social Determinants of Radio broadcast assistant Strain: Not on file  Food Insecurity: Not on file  Transportation Needs: Not on file  Physical Activity: Not on  file  Stress: Not on file  Social Connections: Not on file  Intimate Partner Violence: Not At Risk   Fear of Current or Ex-Partner: No   Emotionally Abused: No   Physically Abused: No   Sexually Abused: No    FAMILY HISTORY: Family History  Problem Relation Age of Onset   Breast cancer Maternal Aunt        dx unknown age   Diabetes Father    Diabetes Paternal Aunt    Breast cancer Cousin        maternal cousin; dx unknown age   Esophageal cancer Neg Hx    Rectal cancer Neg Hx    Stomach cancer Neg Hx    Colon polyps Neg Hx    Colon cancer Neg Hx     Review of Systems  Constitutional:  Positive for fatigue. Negative for appetite change, chills, fever and unexpected weight change.  HENT:   Negative for hearing loss, lump/mass and trouble swallowing.    Eyes:  Negative for eye problems and icterus.  Respiratory:  Negative for chest tightness, cough and shortness of breath.   Cardiovascular:  Negative for chest pain, leg swelling and palpitations.  Gastrointestinal:  Negative for abdominal distention, abdominal pain, constipation, diarrhea, nausea and vomiting.  Endocrine: Negative for hot flashes.  Genitourinary:  Negative for difficulty urinating.   Musculoskeletal:  Negative for arthralgias.  Skin:  Negative for itching and rash.  Neurological:  Negative for dizziness, extremity weakness, headaches and numbness.  Hematological:  Negative for adenopathy. Does not bruise/bleed easily.  Psychiatric/Behavioral:  Negative for depression. The patient is not nervous/anxious.      PHYSICAL EXAMINATION  ECOG PERFORMANCE STATUS: 1 - Symptomatic but completely ambulatory  Vitals:   02/10/21 0807  BP: 134/80  Pulse: 91  Resp: 18  Temp: (!) 97.5 F (36.4 C)  SpO2: 100%    Physical Exam Constitutional:      General: She is not in acute distress.    Appearance: Normal appearance. She is not toxic-appearing.  HENT:     Head: Normocephalic and atraumatic.  Eyes:     General: No scleral icterus. Cardiovascular:     Rate and Rhythm: Normal rate and regular rhythm.     Pulses: Normal pulses.     Heart sounds: Normal heart sounds.  Pulmonary:     Effort: Pulmonary effort is normal.     Breath sounds: Normal breath sounds.  Abdominal:     General: Abdomen is flat. Bowel sounds are normal. There is no distension.     Palpations: Abdomen is soft.     Tenderness: There is no abdominal tenderness.  Musculoskeletal:        General: No swelling.     Cervical back: Neck supple.  Lymphadenopathy:     Cervical: No cervical adenopathy.  Skin:    General: Skin is warm and dry.     Findings: No rash.  Neurological:     General: No focal deficit present.     Mental Status: She is alert.  Psychiatric:        Mood and Affect: Mood normal.         Behavior: Behavior normal.    LABORATORY DATA:  CBC    Component Value Date/Time   WBC 6.4 02/10/2021 0755   RBC 3.94 02/10/2021 0755   HGB 10.3 (L) 02/10/2021 0755   HGB 7.4 (L) 11/19/2020 0854   HGB 10.4 (L) 01/21/2020 1056   HCT 32.5 (L) 02/10/2021 7741  HCT 33.9 (L) 01/21/2020 1056   PLT 197 02/10/2021 0755   PLT 341 11/19/2020 0854   PLT 346 01/21/2020 1056   MCV 82.5 02/10/2021 0755   MCV 71 (L) 01/21/2020 1056   MCH 26.1 02/10/2021 0755   MCHC 31.7 02/10/2021 0755   RDW 13.8 02/10/2021 0755   RDW 18.8 (H) 01/21/2020 1056   LYMPHSABS 0.7 02/10/2021 0755   LYMPHSABS 1.3 01/21/2020 1056   MONOABS 0.4 02/10/2021 0755   EOSABS 0.0 02/10/2021 0755   EOSABS 0.0 01/21/2020 1056   BASOSABS 0.0 02/10/2021 0755   BASOSABS 0.0 01/21/2020 1056    CMP     Component Value Date/Time   NA 141 02/10/2021 0755   NA 135 01/21/2020 1056   K 4.1 02/10/2021 0755   CL 109 02/10/2021 0755   CO2 20 (L) 02/10/2021 0755   GLUCOSE 89 02/10/2021 0755   BUN 9 02/10/2021 0755   BUN 12 01/21/2020 1056   CREATININE 0.76 02/10/2021 0755   CREATININE 0.79 01/21/2021 0817   CALCIUM 8.6 (L) 02/10/2021 0755   PROT 6.9 02/10/2021 0755   ALBUMIN 3.5 02/10/2021 0755   AST 19 02/10/2021 0755   AST 17 01/21/2021 0817   ALT 11 02/10/2021 0755   ALT 13 01/21/2021 0817   ALKPHOS 113 02/10/2021 0755   BILITOT 0.3 02/10/2021 0755   BILITOT 0.5 01/21/2021 0817   GFRNONAA >60 02/10/2021 0755   GFRNONAA >60 01/21/2021 0817   GFRAA 108 01/21/2020 1056       ASSESSMENT and THERAPY PLAN:   Malignant neoplasm of upper-outer quadrant of right breast in female, estrogen receptor positive ([place]) 06/03/2020, US guided biopsy of the right breast 9 0 clock mass showed grade III IDC; Prognostics showed ER 40% positive, weak staining, PR positive 0 %, Her 2 3 +, Ki 67 30% 4dCN1M0 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 palpable right breast mass measuring about 7 cm x 5 and half centimeters,  palpable right axillary lymph node with concern for skin invasion    Treatment Plan: 1. Neoadjuvant chemo with TCHP q 3 weeks X 6 followed by HP 2. Mastectomy with TAD 3. Adj XRT 4. Adj Anti-estrogen therapy 5. Neratinib --------------------------------------------------------------------------------------------------------- Bilateral mastectomies: Left mastectomy: Benign Right mastectomy: Residual microinvasive cancer status post neoadjuvant therapy, DCIS, 0/12 lymph nodes, ER +1%, PR negative, HER2 equivocal 2+ by IHC   Treatment plan: 1.  Herceptin Perjeta maintenance 2. Adjuvant radiation 01/11/2021-02/23/2021  Echo 01/12/2021: EF of 60-65%  [name] is doing quite well today.  I reviewed her labs which were stable today.  We discussed that she is mildly anemic with a hemoglobin of 10.3.  I reviewed iron rich foods that she could eat which may help.  She will continue with Herceptin and Perjeta.  She is following up with radiation oncology about her adjuvant radiation.  She is tolerating both of these things well and will continue with these.  We discussed activity level and healthy diet today.  Her next echo will be due in January 2023.  I sent scheduling messages to secure her next to appointment dates.  Return to clinic every 3 weeks for Herceptin Perjeta and every 6 weeks for labs Herceptin Perjeta and follow-up.   All questions were answered. The patient knows to call the clinic with any problems, questions or concerns. We can certainly see the patient much sooner if necessary.  Total encounter time: 20 minutes in face-to-face time, chart review, lab review, order entry,care coordination, and documentation of the  encounter.  Wilber Bihari, NP 02/10/21 9:49 AM Medical Oncology and Hematology [facility] [place], [place] [address] Tel. [phone]    Fax. [phone]  *Total Encounter Time as defined by the Centers for Medicare and Medicaid  Services includes, in addition to the face-to-face time of a patient visit (documented in the note above) non-face-to-face time: obtaining and reviewing outside history, ordering and reviewing medications, tests or procedures, care coordination (communications with other health care professionals or caregivers) and documentation in the medical record.

## 2021-02-10 NOTE — Assessment & Plan Note (Addendum)
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 palpable right breast mass measuring about 7 cm x 5 and half centimeters, palpable right axillary lymph node with concern for skin invasion  Treatment Plan: 1. Neoadjuvant chemo with TCHP q 3 weeks X 6 followed by HP 2. Mastectomy with TAD 3. Adj XRT 4. Adj Anti-estrogen therapy 5. Neratinib --------------------------------------------------------------------------------------------------------- Bilateral mastectomies: Left mastectomy: Benign Right mastectomy: Residual microinvasive cancer status post neoadjuvant therapy, DCIS, 0/12 lymph nodes, ER +1%, PR negative, HER2 equivocal 2+ by IHC  Treatment plan: 1.  Herceptin Perjeta maintenance 2. Adjuvant radiation 01/11/2021-02/23/2021  Echo 01/12/2021: EF of 60-65%  Mateo Flow is doing quite well today.  I reviewed her labs which were stable today.  We discussed that she is mildly anemic with a hemoglobin of 10.3.  I reviewed iron rich foods that she could eat which may help.  She will continue with Herceptin and Perjeta.  She is following up with radiation oncology about her adjuvant radiation.  She is tolerating both of these things well and will continue with these.  We discussed activity level and healthy diet today.  Her next echo will be due in January 2023.  I sent scheduling messages to secure her next to appointment dates.  Return to clinic every 3 weeks for Herceptin Perjeta and every 6 weeks for labs Herceptin Perjeta and follow-up.

## 2021-02-11 ENCOUNTER — Other Ambulatory Visit: Payer: Self-pay

## 2021-02-11 ENCOUNTER — Other Ambulatory Visit: Payer: Managed Care, Other (non HMO)

## 2021-02-11 ENCOUNTER — Ambulatory Visit
Admission: RE | Admit: 2021-02-11 | Discharge: 2021-02-11 | Disposition: A | Discharge status: Home / Self Care | Payer: Managed Care, Other (non HMO) | Source: Ambulatory Visit | Attending: Radiation Oncology | Admitting: Radiation Oncology

## 2021-02-11 ENCOUNTER — Ambulatory Visit: Payer: Managed Care, Other (non HMO) | Admitting: Hematology and Oncology

## 2021-02-11 ENCOUNTER — Ambulatory Visit: Payer: Managed Care, Other (non HMO) | Admitting: Radiation Oncology

## 2021-02-11 ENCOUNTER — Ambulatory Visit: Payer: Managed Care, Other (non HMO)

## 2021-02-11 DIAGNOSIS — Z5112 Encounter for antineoplastic immunotherapy: Secondary | ICD-10-CM | POA: Diagnosis not present

## 2021-02-14 ENCOUNTER — Other Ambulatory Visit: Payer: Self-pay

## 2021-02-14 ENCOUNTER — Encounter: Payer: Self-pay | Admitting: Radiation Oncology

## 2021-02-14 ENCOUNTER — Ambulatory Visit
Admission: RE | Admit: 2021-02-14 | Discharge: 2021-02-14 | Disposition: A | Discharge status: Home / Self Care | Payer: Managed Care, Other (non HMO) | Source: Ambulatory Visit | Attending: Radiation Oncology | Admitting: Radiation Oncology

## 2021-02-14 DIAGNOSIS — Z5112 Encounter for antineoplastic immunotherapy: Secondary | ICD-10-CM | POA: Diagnosis not present

## 2021-02-15 ENCOUNTER — Ambulatory Visit
Admission: RE | Admit: 2021-02-15 | Discharge: 2021-02-15 | Disposition: A | Discharge status: Home / Self Care | Payer: Managed Care, Other (non HMO) | Source: Ambulatory Visit | Attending: Radiation Oncology | Admitting: Radiation Oncology

## 2021-02-15 DIAGNOSIS — Z5112 Encounter for antineoplastic immunotherapy: Secondary | ICD-10-CM | POA: Diagnosis not present

## 2021-02-16 ENCOUNTER — Ambulatory Visit
Admission: RE | Admit: 2021-02-16 | Discharge: 2021-02-16 | Disposition: A | Discharge status: Home / Self Care | Payer: Managed Care, Other (non HMO) | Source: Ambulatory Visit | Attending: Radiation Oncology | Admitting: Radiation Oncology

## 2021-02-16 ENCOUNTER — Other Ambulatory Visit: Payer: Self-pay

## 2021-02-16 DIAGNOSIS — Z5112 Encounter for antineoplastic immunotherapy: Secondary | ICD-10-CM | POA: Diagnosis not present

## 2021-02-17 ENCOUNTER — Ambulatory Visit
Admission: RE | Admit: 2021-02-17 | Discharge: 2021-02-17 | Disposition: A | Discharge status: Home / Self Care | Payer: Managed Care, Other (non HMO) | Source: Ambulatory Visit | Attending: Radiation Oncology | Admitting: Radiation Oncology

## 2021-02-17 ENCOUNTER — Ambulatory Visit: Payer: Managed Care, Other (non HMO)

## 2021-02-17 DIAGNOSIS — Z5112 Encounter for antineoplastic immunotherapy: Secondary | ICD-10-CM | POA: Diagnosis not present

## 2021-02-18 ENCOUNTER — Ambulatory Visit: Payer: Managed Care, Other (non HMO)

## 2021-02-18 ENCOUNTER — Other Ambulatory Visit: Payer: Self-pay

## 2021-02-18 ENCOUNTER — Ambulatory Visit
Admission: RE | Admit: 2021-02-18 | Discharge: 2021-02-18 | Disposition: A | Discharge status: Home / Self Care | Payer: Managed Care, Other (non HMO) | Source: Ambulatory Visit | Attending: Radiation Oncology | Admitting: Radiation Oncology

## 2021-02-18 ENCOUNTER — Ambulatory Visit: Payer: Managed Care, Other (non HMO) | Admitting: Radiation Oncology

## 2021-02-18 DIAGNOSIS — Z5112 Encounter for antineoplastic immunotherapy: Secondary | ICD-10-CM | POA: Diagnosis not present

## 2021-02-21 ENCOUNTER — Ambulatory Visit: Payer: Managed Care, Other (non HMO)

## 2021-02-21 ENCOUNTER — Ambulatory Visit
Admission: RE | Admit: 2021-02-21 | Discharge: 2021-02-21 | Disposition: A | Discharge status: Home / Self Care | Payer: Managed Care, Other (non HMO) | Source: Ambulatory Visit | Attending: Radiation Oncology | Admitting: Radiation Oncology

## 2021-02-21 ENCOUNTER — Other Ambulatory Visit: Payer: Self-pay

## 2021-02-21 DIAGNOSIS — Z5112 Encounter for antineoplastic immunotherapy: Secondary | ICD-10-CM | POA: Diagnosis not present

## 2021-02-22 ENCOUNTER — Ambulatory Visit: Payer: Managed Care, Other (non HMO)

## 2021-02-22 ENCOUNTER — Ambulatory Visit
Admission: RE | Admit: 2021-02-22 | Discharge: 2021-02-22 | Disposition: A | Discharge status: Home / Self Care | Payer: Managed Care, Other (non HMO) | Source: Ambulatory Visit | Attending: Radiation Oncology | Admitting: Radiation Oncology

## 2021-02-22 DIAGNOSIS — Z5112 Encounter for antineoplastic immunotherapy: Secondary | ICD-10-CM | POA: Diagnosis not present

## 2021-02-23 ENCOUNTER — Ambulatory Visit: Admission: RE | Admit: 2021-02-23 | Payer: Managed Care, Other (non HMO) | Source: Ambulatory Visit

## 2021-02-23 ENCOUNTER — Encounter: Payer: Self-pay | Admitting: *Deleted

## 2021-02-23 ENCOUNTER — Ambulatory Visit: Payer: Managed Care, Other (non HMO)

## 2021-02-23 ENCOUNTER — Other Ambulatory Visit: Payer: Self-pay

## 2021-02-23 DIAGNOSIS — Z5112 Encounter for antineoplastic immunotherapy: Secondary | ICD-10-CM | POA: Diagnosis not present

## 2021-02-23 NOTE — Progress Notes (Signed)
RN successfully faxed dental clearance form to [facility] and [employer] [phone]).  MD requesting pt be placed on Amoxicillin 500 mg prior to any dental procedures including fillings, root canal's, and extractions.

## 2021-02-24 ENCOUNTER — Ambulatory Visit: Payer: Managed Care, Other (non HMO)

## 2021-02-24 ENCOUNTER — Ambulatory Visit: Payer: Managed Care, Other (non HMO) | Admitting: Radiation Oncology

## 2021-02-24 ENCOUNTER — Ambulatory Visit
Admission: RE | Admit: 2021-02-24 | Discharge: 2021-02-24 | Disposition: A | Discharge status: Home / Self Care | Payer: Managed Care, Other (non HMO) | Source: Ambulatory Visit | Attending: Radiation Oncology | Admitting: Radiation Oncology

## 2021-02-24 ENCOUNTER — Encounter: Payer: Self-pay | Admitting: *Deleted

## 2021-02-24 DIAGNOSIS — Z5112 Encounter for antineoplastic immunotherapy: Secondary | ICD-10-CM | POA: Diagnosis not present

## 2021-02-25 ENCOUNTER — Ambulatory Visit: Payer: Managed Care, Other (non HMO)

## 2021-02-25 ENCOUNTER — Ambulatory Visit
Admission: RE | Admit: 2021-02-25 | Discharge: 2021-02-25 | Disposition: A | Discharge status: Home / Self Care | Payer: Managed Care, Other (non HMO) | Source: Ambulatory Visit | Attending: Radiation Oncology | Admitting: Radiation Oncology

## 2021-02-25 DIAGNOSIS — Z5112 Encounter for antineoplastic immunotherapy: Secondary | ICD-10-CM | POA: Diagnosis not present

## 2021-02-27 ENCOUNTER — Ambulatory Visit
Admission: RE | Admit: 2021-02-27 | Discharge: 2021-02-27 | Disposition: A | Discharge status: Home / Self Care | Payer: Managed Care, Other (non HMO) | Source: Ambulatory Visit | Attending: Radiation Oncology | Admitting: Radiation Oncology

## 2021-02-27 DIAGNOSIS — Z5112 Encounter for antineoplastic immunotherapy: Secondary | ICD-10-CM | POA: Diagnosis not present

## 2021-02-28 ENCOUNTER — Ambulatory Visit: Payer: Managed Care, Other (non HMO)

## 2021-02-28 ENCOUNTER — Ambulatory Visit
Admission: RE | Admit: 2021-02-28 | Discharge: 2021-02-28 | Disposition: A | Discharge status: Home / Self Care | Payer: Managed Care, Other (non HMO) | Source: Ambulatory Visit | Attending: Radiation Oncology | Admitting: Radiation Oncology

## 2021-02-28 ENCOUNTER — Other Ambulatory Visit: Payer: Self-pay

## 2021-02-28 DIAGNOSIS — Z5112 Encounter for antineoplastic immunotherapy: Secondary | ICD-10-CM | POA: Diagnosis not present

## 2021-02-28 NOTE — Progress Notes (Incomplete)
She developed fatigue and anticipated skin changes in the treatment field. She had been admitted early in the course of her radiation with a small bowel obstruction at the level of her prior surgical anastamosis from her history of crohn's disease. She missed a few treatments during this time. She was contemplating not continuing therapy, however she was able to finish this.  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

## 2021-03-01 ENCOUNTER — Ambulatory Visit: Payer: Managed Care, Other (non HMO)

## 2021-03-02 ENCOUNTER — Other Ambulatory Visit: Payer: Self-pay

## 2021-03-02 ENCOUNTER — Inpatient Hospital Stay: Payer: Managed Care, Other (non HMO)

## 2021-03-02 ENCOUNTER — Ambulatory Visit: Payer: Managed Care, Other (non HMO)

## 2021-03-02 VITALS — BP 136/72 | HR 85 | Temp 98.0°F | Resp 18 | Wt 148.5 lb

## 2021-03-02 DIAGNOSIS — C50411 Malignant neoplasm of upper-outer quadrant of right female breast: Secondary | ICD-10-CM

## 2021-03-02 DIAGNOSIS — Z5112 Encounter for antineoplastic immunotherapy: Secondary | ICD-10-CM | POA: Diagnosis not present

## 2021-03-02 DIAGNOSIS — Z95828 Presence of other vascular implants and grafts: Secondary | ICD-10-CM

## 2021-03-02 DIAGNOSIS — Z170 Estrogen receptor positive status [ER+]: Secondary | ICD-10-CM

## 2021-03-02 LAB — COMPREHENSIVE METABOLIC PANEL
ALT: 13 U/L (ref 0–44)
AST: 17 U/L (ref 15–41)
Albumin: 3.6 g/dL (ref 3.5–5.0)
Alkaline Phosphatase: 112 U/L (ref 38–126)
Anion gap: 6 (ref 5–15)
BUN: 7 mg/dL (ref 6–20)
CO2: 25 mmol/L (ref 22–32)
Calcium: 8.9 mg/dL (ref 8.9–10.3)
Chloride: 110 mmol/L (ref 98–111)
Creatinine, Ser: 0.79 mg/dL (ref 0.44–1.00)
GFR, Estimated: >60 mL/min (ref >60)
Glucose, Bld: 95 mg/dL (ref 70–99)
Potassium: 3.9 mmol/L (ref 3.5–5.1)
Sodium: 141 mmol/L (ref 135–145)
Total Bilirubin: 0.3 mg/dL (ref 0.3–1.2)
Total Protein: 7 g/dL (ref 6.5–8.1)

## 2021-03-02 LAB — CBC WITH DIFFERENTIAL/PLATELET
Abs Immature Granulocytes: 0.01 10*3/uL (ref 0.00–0.07)
Basophils Absolute: 0 10*3/uL (ref 0.0–0.1)
Basophils Relative: 0 %
Eosinophils Absolute: 0 10*3/uL (ref 0.0–0.5)
Eosinophils Relative: 1 %
HCT: 32.7 % — ABNORMAL LOW (ref 36.0–46.0)
Hemoglobin: 10.5 g/dL — ABNORMAL LOW (ref 12.0–15.0)
Immature Granulocytes: 0 %
Lymphocytes Relative: 11 %
Lymphs Abs: 0.5 10*3/uL — ABNORMAL LOW (ref 0.7–4.0)
MCH: 26.2 pg (ref 26.0–34.0)
MCHC: 32.1 g/dL (ref 30.0–36.0)
MCV: 81.5 fL (ref 80.0–100.0)
Monocytes Absolute: 0.4 10*3/uL (ref 0.1–1.0)
Monocytes Relative: 8 %
Neutro Abs: 4.1 10*3/uL (ref 1.7–7.7)
Neutrophils Relative %: 80 %
Platelets: 208 10*3/uL (ref 150–400)
RBC: 4.01 MIL/uL (ref 3.87–5.11)
RDW: 14.5 % (ref 11.5–15.5)
WBC: 5 10*3/uL (ref 4.0–10.5)
nRBC: 0 % (ref 0.0–0.2)

## 2021-03-02 LAB — MAGNESIUM: Magnesium: 1.8 mg/dL (ref 1.7–2.4)

## 2021-03-02 MED ORDER — SODIUM CHLORIDE 0.9 % IV SOLN
420.0000 mg | Freq: Once | INTRAVENOUS | Status: AC
  Administered 2021-03-02: 420 mg via INTRAVENOUS
  Filled 2021-03-02: qty 14

## 2021-03-02 MED ORDER — ACETAMINOPHEN 325 MG PO TABS
650.0000 mg | ORAL_TABLET | Freq: Once | ORAL | Status: AC
  Administered 2021-03-02: 650 mg via ORAL
  Filled 2021-03-02: qty 2

## 2021-03-02 MED ORDER — TRASTUZUMAB-ANNS CHEMO 150 MG IV SOLR
6.0000 mg/kg | Freq: Once | INTRAVENOUS | Status: AC
  Administered 2021-03-02: 399 mg via INTRAVENOUS
  Filled 2021-03-02: qty 19

## 2021-03-02 MED ORDER — DIPHENHYDRAMINE HCL 25 MG PO CAPS
25.0000 mg | ORAL_CAPSULE | Freq: Once | ORAL | Status: AC
  Administered 2021-03-02: 25 mg via ORAL
  Filled 2021-03-02: qty 1

## 2021-03-02 MED ORDER — HEPARIN SOD (PORK) LOCK FLUSH 100 UNIT/ML IV SOLN
500.0000 [IU] | Freq: Once | INTRAVENOUS | Status: AC | PRN
  Administered 2021-03-02: 500 [IU]

## 2021-03-02 MED ORDER — SODIUM CHLORIDE 0.9 % IV SOLN
Freq: Once | INTRAVENOUS | Status: AC
Start: 2021-03-02 — End: 2021-03-02

## 2021-03-02 MED ORDER — SODIUM CHLORIDE 0.9% FLUSH
10.0000 mL | Freq: Once | INTRAVENOUS | Status: AC
  Administered 2021-03-02: 10 mL

## 2021-03-02 MED ORDER — SODIUM CHLORIDE 0.9% FLUSH
10.0000 mL | INTRAVENOUS | Status: DC | PRN
  Administered 2021-03-02: 10 mL

## 2021-03-02 NOTE — Progress Notes (Signed)
Patient declined 30 minute post observation period.  VSS.  No s/s or c/o distress or discomfort.

## 2021-03-02 NOTE — Patient Instructions (Signed)
[facility]  Discharge Instructions: Thank you for choosing [facility] to provide your oncology and hematology care.   If you have a lab appointment with the [facility], please go directly to the [facility] and check in at the registration area.   Wear comfortable clothing and clothing appropriate for easy access to any Portacath or PICC line.   We strive to give you quality time with your provider. You may need to reschedule your appointment if you arrive late (15 or more minutes).  Arriving late affects you and other patients whose appointments are after yours.  Also, if you miss three or more appointments without notifying the office, you may be dismissed from the clinic at the provider's discretion.      For prescription refill requests, have your pharmacy contact our office and allow 72 hours for refills to be completed.    Today you received the following chemotherapy and/or immunotherapy agents: Kanjinti/Perjeta.      To help prevent nausea and vomiting after your treatment, we encourage you to take your nausea medication as directed.  BELOW ARE SYMPTOMS THAT SHOULD BE REPORTED IMMEDIATELY: *FEVER GREATER THAN 100.4 F (38 C) OR HIGHER *CHILLS OR SWEATING *NAUSEA AND VOMITING THAT IS NOT CONTROLLED WITH YOUR NAUSEA MEDICATION *UNUSUAL SHORTNESS OF BREATH *UNUSUAL BRUISING OR BLEEDING *URINARY PROBLEMS (pain or burning when urinating, or frequent urination) *BOWEL PROBLEMS (unusual diarrhea, constipation, pain near the anus) TENDERNESS IN MOUTH AND THROAT WITH OR WITHOUT PRESENCE OF ULCERS (sore throat, sores in mouth, or a toothache) UNUSUAL RASH, SWELLING OR PAIN  UNUSUAL VAGINAL DISCHARGE OR ITCHING   Items with * indicate a potential emergency and should be followed up as soon as possible or go to the Emergency Department if any problems should occur.  Please show the CHEMOTHERAPY ALERT CARD or IMMUNOTHERAPY ALERT CARD at  check-in to the Emergency Department and triage nurse.  Should you have questions after your visit or need to cancel or reschedule your appointment, please contact [facility]  Dept: [phone]  and follow the prompts.  Office hours are 8:00 a.m. to 4:30 p.m. Monday - Friday. Please note that voicemails left after 4:00 p.m. may not be returned until the following business day.  We are closed weekends and major holidays. You have access to a nurse at all times for urgent questions. Please call the main number to the clinic Dept: [phone] and follow the prompts.   For any non-urgent questions, you may also contact your provider using MyChart. We now offer e-Visits for anyone 23 and older to request care online for non-urgent symptoms. For details visit [email or web address].   Also download the MyChart app! Go to the app store, search "MyChart", open the app, select [facility], and log in with your MyChart username and password.  Due to Covid, a mask is required upon entering the hospital/clinic. If you do not have a mask, one will be given to you upon arrival. For doctor visits, patients may have 1 support person aged 32 or older with them. For treatment visits, patients cannot have anyone with them due to current Covid guidelines and our immunocompromised population.

## 2021-03-03 ENCOUNTER — Ambulatory Visit: Payer: Managed Care, Other (non HMO)

## 2021-03-04 ENCOUNTER — Ambulatory Visit: Payer: Managed Care, Other (non HMO)

## 2021-03-04 ENCOUNTER — Telehealth: Payer: Self-pay | Admitting: Hematology and Oncology

## 2021-03-04 NOTE — Telephone Encounter (Signed)
Scheduled per sch msg. Patient wanted to move all appts to [facility]. Called and left msg

## 2021-03-07 ENCOUNTER — Ambulatory Visit: Payer: Managed Care, Other (non HMO) | Attending: General Surgery

## 2021-03-07 ENCOUNTER — Ambulatory Visit: Payer: Managed Care, Other (non HMO)

## 2021-03-07 ENCOUNTER — Encounter: Payer: Self-pay | Admitting: Radiation Oncology

## 2021-03-07 ENCOUNTER — Other Ambulatory Visit: Payer: Self-pay

## 2021-03-07 ENCOUNTER — Ambulatory Visit
Admission: RE | Admit: 2021-03-07 | Discharge: 2021-03-07 | Disposition: A | Discharge status: Home / Self Care | Payer: Managed Care, Other (non HMO) | Source: Ambulatory Visit | Attending: Radiation Oncology | Admitting: Radiation Oncology

## 2021-03-07 VITALS — Wt 147.1 lb

## 2021-03-07 DIAGNOSIS — Z483 Aftercare following surgery for neoplasm: Secondary | ICD-10-CM | POA: Insufficient documentation

## 2021-03-07 DIAGNOSIS — Z5112 Encounter for antineoplastic immunotherapy: Secondary | ICD-10-CM | POA: Diagnosis not present

## 2021-03-07 NOTE — Progress Notes (Signed)
She developed fatigue and anticipated skin changes in the treatment field. She had been admitted early in the course of her radiation with a small bowel obstruction at the level of her prior surgical anastamosis from her history of crohn's disease. She missed a few treatments during this time. She was contemplating not continuing therapy, however she was able to finish this.  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

## 2021-03-07 NOTE — Therapy (Signed)
[facility] @ [facility], [place], [address] Phone: [phone]   Fax:  [phone]  Physical Therapy Treatment  Patient Details  Name: [name] MRN: [id number] Date of Birth: 1975/10/20 Referring Provider (PT): Dr. 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 10/17/2010   Rectal bleeding    Uterine fibroid    Vitamin D deficiency 09/24/2013    Past Surgical History:  Procedure Laterality Date   AXILLARY LYMPH NODE DISSECTION Right 11/09/2020   Procedure: RIGHT AXILLARY LYMPH NODE DISSECTION;  Surgeon: [name], MD;  Location: [facility];  Service: General;  Laterality: Right;   BIOPSY  07/01/2020   Procedure: BIOPSY;  Surgeon: Yetta Flock, MD;  Location: [facility];  Service: Gastroenterology;;   BIOPSY  01/27/2021   Procedure: BIOPSY;  Surgeon: Yetta Flock, MD;  [facility];  Service: Gastroenterology;;   BREAST RECONSTRUCTION WITH PLACEMENT OF TISSUE EXPANDER AND FLEX HD (ACELLULAR HYDRATED DERMIS) Bilateral 11/09/2020   Procedure: BILATERAL BREAST RECONSTRUCTION WITH PLACEMENT OF TISSUE EXPANDER AND FLEX HD (ACELLULAR HYDRATED DERMIS);  Surgeon: [name], MD;  Location: [facility];  Service: Plastics;  Laterality: Bilateral;   COLECTOMY  06/27/2019   COLONOSCOPY  2015   FLEXIBLE SIGMOIDOSCOPY N/A  07/01/2020   Procedure: FLEXIBLE SIGMOIDOSCOPY;  Surgeon: Yetta Flock, MD;  [facility];  Service: Gastroenterology;  Laterality: N/A;   FLEXIBLE SIGMOIDOSCOPY N/A 01/27/2021   Procedure: FLEXIBLE SIGMOIDOSCOPY;  Surgeon: Yetta Flock, MD;  [facility];  Service: Gastroenterology;  Laterality: N/A;   NIPPLE SPARING MASTECTOMY Bilateral 11/09/2020   Procedure: BILATERAL NIPPLE SPARING MASTECTOMY;  Surgeon: [name], MD;  Location: [facility];  Service: General;  Laterality: Bilateral;   PORTACATH PLACEMENT N/A 06/23/2020   Procedure: INSERTION PORT-A-CATH;  Surgeon: [name], MD;  Location: [facility];  Service: General;  Laterality: N/A;  START TIME OF 11:00 AM FOR 60 MINUTES [place] [facility]    Vitals:   03/07/21 0833  Weight: 147 lb 2 oz (66.7 kg)     Subjective Assessment - 03/07/21 0832     Subjective Pt returns for her 3 month L-Dex screen.    Pertinent History Patient was diagnosed on 06/04/2020 with right grade III invasive ductal carcinoma breast cancer. She neoadjuvant chemotherapy from 06/25/2020 - 10/29/2020 followed by bilateral mastectomies with expanders placed on 11/09/2020 with 0/12 axillary nodes positive for cancer. It is ER positive, PR negative, and HER2 negative with a Ki67 of 40%.                    L-DEX FLOWSHEETS - 03/07/21 0800       L-DEX LYMPHEDEMA SCREENING   Measurement Type Unilateral    L-DEX  MEASUREMENT EXTREMITY Upper Extremity    POSITION  Standing    DOMINANT SIDE Right    At Risk Side Right    BASELINE SCORE (UNILATERAL) -4.4    L-DEX SCORE (UNILATERAL) -5.6    VALUE CHANGE (UNILAT) -1.2                                     PT Long Term Goals - 12/06/20 1551       PT LONG TERM GOAL #1   Title Patient will demonstate she has regained full shoulder ROM and function post operatively compared to baselines.    Time 6    Period  Months    Status Achieved                   Plan - 03/07/21 0841     Clinical Impression Statement Pt returns for her 3 month L-Dex screen. Her change from baseline of -1.2 is WNLs so no further treatment is required at this time except to cont every 3 month L-Dex screens which pt is agreeable to.    PT Next Visit Plan Cont every 3 month L-Dex screen for up to 2 years from her ALND (~11/10/22)    Consulted and Agree with Plan of Care Patient             Patient will benefit from skilled therapeutic intervention in order to improve the following deficits and impairments:     Visit Diagnosis: Aftercare following surgery for neoplasm     Problem List Patient Active Problem List   Diagnosis Date Noted   Crohn's disease of small and large intestines with complication ([place])    Small bowel obstruction ([place]) 01/26/2021   History of Crohn's disease    Breast cancer ([place]) 11/09/2020   Port-A-Cath in place 07/15/2020   Genetic testing 06/25/2020   Family history of breast cancer 06/11/2020   Malignant neoplasm of upper-outer quadrant of right breast in female, estrogen receptor positive ([place]) 06/04/2020   Abnormal finding present on diagnostic imaging of uterus 05/07/2019   Vitamin D deficiency 09/24/2013   Crohn's colitis, with intestinal obstruction ([place]) 09/17/2013   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

## 2021-03-08 ENCOUNTER — Ambulatory Visit
Admission: RE | Admit: 2021-03-08 | Discharge: 2021-03-08 | Disposition: A | Discharge status: Home / Self Care | Payer: Managed Care, Other (non HMO) | Source: Ambulatory Visit | Attending: Radiation Oncology | Admitting: Radiation Oncology

## 2021-03-08 ENCOUNTER — Ambulatory Visit: Payer: Managed Care, Other (non HMO)

## 2021-03-08 DIAGNOSIS — Z5112 Encounter for antineoplastic immunotherapy: Secondary | ICD-10-CM | POA: Diagnosis not present

## 2021-03-09 ENCOUNTER — Encounter: Payer: Self-pay | Admitting: Radiation Oncology

## 2021-03-09 ENCOUNTER — Ambulatory Visit
Admission: RE | Admit: 2021-03-09 | Discharge: 2021-03-09 | Disposition: A | Discharge status: Home / Self Care | Payer: Managed Care, Other (non HMO) | Source: Ambulatory Visit | Attending: Radiation Oncology | Admitting: Radiation Oncology

## 2021-03-09 ENCOUNTER — Other Ambulatory Visit: Payer: Self-pay

## 2021-03-09 DIAGNOSIS — Z5112 Encounter for antineoplastic immunotherapy: Secondary | ICD-10-CM | POA: Diagnosis not present

## 2021-03-10 ENCOUNTER — Encounter: Payer: Self-pay | Admitting: Hematology and Oncology

## 2021-03-10 NOTE — Progress Notes (Signed)
                                                                                                                                                             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 She developed fatigue and anticipated skin changes in the treatment field. She had been admitted early in the course of her radiation with a small bowel obstruction at the level of her prior surgical anastamosis from her history of crohn's disease. She missed a few treatments during this time. She was contemplating not continuing therapy, however she was able to finish this.  Plan: The patient will receive a call in about one month from the radiation oncology department. She will continue follow up with Dr.  Lindi Adie as well.   ________________________________________________    [name], [facility]

## 2021-03-24 ENCOUNTER — Ambulatory Visit: Payer: Managed Care, Other (non HMO) | Admitting: Hematology and Oncology

## 2021-03-24 ENCOUNTER — Ambulatory Visit: Payer: Managed Care, Other (non HMO)

## 2021-03-24 ENCOUNTER — Other Ambulatory Visit: Payer: Managed Care, Other (non HMO)

## 2021-03-24 NOTE — Assessment & Plan Note (Signed)
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 palpable right breast mass measuring about 7 cm x 5 and half centimeters, palpable right axillary lymph node with concern for skin invasion  Treatment Plan: 1. Neoadjuvant chemo with TCHP q 3 weeks X 6 followed by HP 2. Mastectomy with TAD 3. Adj XRT 4. Adj Anti-estrogen therapy 5. Neratinib --------------------------------------------------------------------------------------------------------- Bilateral mastectomies: Left mastectomy: Benign Right mastectomy: Residual microinvasive cancer status post neoadjuvant therapy, DCIS, 0/12 lymph nodes, ER +1%, PR negative, HER2 equivocal 2+ by IHC  Treatment plan: 1. Herceptin Perjeta maintenance 2. Adjuvant radiation 01/11/2021-02/23/2021  Echo 01/12/2021: EF of 60-65% Since radiation was completed recommended antiestrogen therapy with tamoxifen.  Tamoxifen counseling: We discussed the risks and benefits of tamoxifen. These include but not limited to insomnia, hot flashes, mood changes, vaginal dryness, and weight gain. Although rare, serious side effects including endometrial cancer, risk of blood clots were also discussed. We strongly believe that the benefits far outweigh the risks. Patient understands these risks and consented to starting treatment. Planned treatment duration is 10 years.  Return to clinic every 3 weeks for Herceptin and Perjeta and every 6 weeks to follow-up with me

## 2021-03-24 NOTE — Progress Notes (Signed)
Patient Care Team: Patient, [name] (Inactive) as PCP - General (General Practice) [name], RN as Oncology Nurse Navigator [name], RN as Oncology Nurse Navigator  DIAGNOSIS:    ICD-10-CM   1. 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 palpable right breast mass measuring about 7 cm x 5 and half centimeters, palpable right axillary lymph node with concern for skin invasion    06/08/2020 Cancer Staging   Staging form: Breast, AJCC 8th Edition - Clinical: Stage IIIB (cT4d, cN1, cM0, G3, ER+, PR-, HER2+) - Signed by [name], MD on 07/07/2020 Histologic grading system: 3 grade system    06/24/2020 Genetic Testing   No pathogenic variants detected in [facility] [employer]-Cancer +RNAinsight.  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  The report date is June 24, 2020.   The CustomNext-Cancer+RNAinsight panel offered by [facility] includes sequencing and rearrangement analysis for the following 47 genes:  APC, ATM, AXIN2, BARD1, BMPR1A, BRCA1, BRCA2, BRIP1, CDH1, CDK4, CDKN2A, CHEK2, DICER1, EPCAM, GREM1, HOXB13, MEN1, MLH1, MSH2, MSH3, MSH6, MUTYH, NBN, NF1, NF2, NTHL1, PALB2, PMS2, POLD1, POLE, PTEN, RAD51C, RAD51D, RECQL, RET, SDHA, SDHAF2, SDHB, SDHC, SDHD, SMAD4, SMARCA4, STK11, TP53, TSC1, TSC2, and VHL.  RNA data is routinely analyzed for use in variant interpretation for all genes.   06/25/2020 - 10/29/2020  Chemotherapy   TCHP x6 cycles   11/09/2020 Surgery   Bilateral mastectomies: Left mastectomy: Benign Right mastectomy: Residual microinvasive cancer status post neoadjuvant therapy, DCIS, 0/12 lymph nodes, ER +1%, PR negative, HER2 equivocal 2+ by [facility]   11/19/2020 -  Chemotherapy   Patient is on Treatment Plan : BREAST Trastuzumab  + Pertuzumab q21d x 13 cycles       CHIEF COMPLIANT: Follow-up of right breast cancer  INTERVAL HISTORY: ASENETH HACK is a 45 y.o. with above-mentioned history of HER-2 positive right breast cancer currently on neoadjuvant chemotherapy with [place], but Perjeta and Taxotere were discontinued due to gastrointestinal side effects. She presents to the clinic today for a toxicity check.   ALLERGIES:  is allergic to other.  MEDICATIONS:  Current Outpatient Medications  Medication Sig Dispense Refill   acetaminophen (TYLENOL) 500 MG tablet Take 500-1,000 mg by mouth every 6 (six) hours as needed for mild pain.     lidocaine-prilocaine (EMLA) cream Apply 1 application topically as needed (Prior to port access). 30 g 2   mesalamine (LIALDA) 1.2 g EC tablet Take 2 tablets (2.4 g total) by mouth 2 (two) times daily with a meal. 120 tablet 11   polyethylene glycol (MIRALAX) 17 g packet Take 17 g by mouth daily as needed. 14 each 0   No current facility-administered medications for this visit.    PHYSICAL EXAMINATION: ECOG PERFORMANCE STATUS: 1 - Symptomatic but completely ambulatory  Vitals:   03/25/21 1333  BP: (!) 146/90  Pulse: 99  Resp: 18  Temp: 97.9 F (36.6 C)  SpO2: 100%   Filed Weights   03/25/21 1333  Weight: 147 lb 9.6 oz (67 kg)    BREAST: No palpable masses or nodules in either right or left breasts. No palpable axillary supraclavicular or infraclavicular adenopathy no breast tenderness or nipple discharge. (exam performed in the presence of a chaperone)  LABORATORY DATA:  I have reviewed the data as listed CMP Latest Ref Rng &  Units 03/02/2021 02/10/2021 01/27/2021  Glucose 70 - 99 mg/dL 95 89 94  BUN 6 - 20 mg/dL 7 9 21(H)  Creatinine 0.44 - 1.00 mg/dL 0.79 0.76 0.98  Sodium 135 - 145 mmol/L 141 141 139  Potassium 3.5 - 5.1 mmol/L 3.9 4.1 3.8  Chloride 98 - 111 mmol/L 110 109 107  CO2 22 - 32 mmol/L 25 20(L) 25  Calcium 8.9 - 10.3 mg/dL 8.9 8.6(L) 8.7(L)  Total Protein 6.5 - 8.1 g/dL 7.0 6.9 -  Total Bilirubin 0.3 - 1.2 mg/dL 0.3 0.3 -  Alkaline Phos 38 - 126 U/L 112 113 -  AST 15 - 41 U/L 17 19 -  ALT 0 - 44 U/L 13 11 -    Lab Results  Component Value Date   WBC 6.2 03/25/2021   HGB 10.4 (L) 03/25/2021   HCT 32.0 (L) 03/25/2021   MCV 80.8 03/25/2021   PLT 215 03/25/2021   NEUTROABS 4.8 03/25/2021    ASSESSMENT & PLAN:  Malignant neoplasm of upper-outer quadrant of right breast in female, estrogen receptor positive ([place]) 06/03/2020, US guided biopsy of the right breast 9 0 clock mass showed grade III IDC; Prognostics showed ER 40% positive, weak staining, PR positive 0 %, Her 2 3 +, Ki 67 30% 4dCN1M0 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 palpable right breast mass measuring about 7 cm x 5 and half centimeters, palpable right axillary lymph node with concern for skin invasion    Treatment Plan: 1. Neoadjuvant chemo with TCHP q 3 weeks X 6 followed by HP 2. Bilateral mastectomies: Left mastectomy: Benign Right mastectomy: Residual microinvasive cancer status post neoadjuvant therapy, DCIS, 0/12 lymph nodes, ER +1%, PR negative, HER2 equivocal 2+ by IHC 3. Adj XRT completed 02/23/2021 4. Adj Anti-estrogen therapy with tamoxifen to start 04/24/2021 5. Neratinib --------------------------------------------------------------------------------------------------------- Treatment plan: 1.  Herceptin Perjeta maintenance 2. Adjuvant radiation 01/11/2021-02/23/2021   Echo 01/12/2021: EF of 60-65% Since radiation was completed recommended antiestrogen therapy with tamoxifen.  Tamoxifen counseling: We  discussed the risks and benefits of tamoxifen. These include but not limited to insomnia, hot flashes, mood changes, vaginal dryness, and weight gain. Although rare, serious side effects including endometrial cancer, risk of blood clots were also discussed. We strongly believe that the benefits far outweigh the risks. Patient understands these risks and consented to starting treatment. Planned treatment duration is 10 years.  Return to clinic every 3 weeks for Herceptin and Perjeta and every 6 weeks to follow-up with me   No orders of the defined types were placed in this encounter.  The patient has a good understanding of the overall plan. she agrees with it. she will call with any problems that may develop before the next visit here.  Total time spent: 20 mins including face to face time and time spent for planning, charting and coordination of care  Rulon Eisenmenger, MD, MPH 03/25/2021  I, Thana Ates, am acting as scribe for Dr. [name].  I have reviewed the above documentation for accuracy and completeness, and I agree with the above.

## 2021-03-25 ENCOUNTER — Other Ambulatory Visit: Payer: Self-pay

## 2021-03-25 ENCOUNTER — Inpatient Hospital Stay: Payer: Managed Care, Other (non HMO)

## 2021-03-25 ENCOUNTER — Inpatient Hospital Stay (HOSPITAL_BASED_OUTPATIENT_CLINIC_OR_DEPARTMENT_OTHER): Payer: Managed Care, Other (non HMO) | Admitting: Hematology and Oncology

## 2021-03-25 ENCOUNTER — Inpatient Hospital Stay: Payer: Managed Care, Other (non HMO) | Attending: Hematology and Oncology

## 2021-03-25 VITALS — BP 138/70 | HR 84 | Temp 98.2°F | Resp 18

## 2021-03-25 DIAGNOSIS — Z170 Estrogen receptor positive status [ER+]: Secondary | ICD-10-CM

## 2021-03-25 DIAGNOSIS — Z95828 Presence of other vascular implants and grafts: Secondary | ICD-10-CM

## 2021-03-25 DIAGNOSIS — C50411 Malignant neoplasm of upper-outer quadrant of right female breast: Secondary | ICD-10-CM | POA: Diagnosis not present

## 2021-03-25 DIAGNOSIS — Z5112 Encounter for antineoplastic immunotherapy: Secondary | ICD-10-CM | POA: Insufficient documentation

## 2021-03-25 LAB — CBC WITH DIFFERENTIAL/PLATELET
Abs Immature Granulocytes: 0.01 10*3/uL (ref 0.00–0.07)
Basophils Absolute: 0 10*3/uL (ref 0.0–0.1)
Basophils Relative: 1 %
Eosinophils Absolute: 0 10*3/uL (ref 0.0–0.5)
Eosinophils Relative: 0 %
HCT: 32 % — ABNORMAL LOW (ref 36.0–46.0)
Hemoglobin: 10.4 g/dL — ABNORMAL LOW (ref 12.0–15.0)
Immature Granulocytes: 0 %
Lymphocytes Relative: 13 %
Lymphs Abs: 0.8 10*3/uL (ref 0.7–4.0)
MCH: 26.3 pg (ref 26.0–34.0)
MCHC: 32.5 g/dL (ref 30.0–36.0)
MCV: 80.8 fL (ref 80.0–100.0)
Monocytes Absolute: 0.6 10*3/uL (ref 0.1–1.0)
Monocytes Relative: 10 %
Neutro Abs: 4.8 10*3/uL (ref 1.7–7.7)
Neutrophils Relative %: 76 %
Platelets: 215 10*3/uL (ref 150–400)
RBC: 3.96 MIL/uL (ref 3.87–5.11)
RDW: 15.3 % (ref 11.5–15.5)
WBC: 6.2 10*3/uL (ref 4.0–10.5)
nRBC: 0 % (ref 0.0–0.2)

## 2021-03-25 LAB — COMPREHENSIVE METABOLIC PANEL
ALT: 12 U/L (ref 0–44)
AST: 19 U/L (ref 15–41)
Albumin: 3.8 g/dL (ref 3.5–5.0)
Alkaline Phosphatase: 121 U/L (ref 38–126)
Anion gap: 10 (ref 5–15)
BUN: 11 mg/dL (ref 6–20)
CO2: 22 mmol/L (ref 22–32)
Calcium: 9.1 mg/dL (ref 8.9–10.3)
Chloride: 107 mmol/L (ref 98–111)
Creatinine, Ser: 0.78 mg/dL (ref 0.44–1.00)
GFR, Estimated: >60 mL/min (ref >60)
Glucose, Bld: 85 mg/dL (ref 70–99)
Potassium: 3.7 mmol/L (ref 3.5–5.1)
Sodium: 139 mmol/L (ref 135–145)
Total Bilirubin: 0.5 mg/dL (ref 0.3–1.2)
Total Protein: 7.6 g/dL (ref 6.5–8.1)

## 2021-03-25 LAB — MAGNESIUM: Magnesium: 1.8 mg/dL (ref 1.7–2.4)

## 2021-03-25 MED ORDER — DIPHENHYDRAMINE HCL 25 MG PO CAPS
25.0000 mg | ORAL_CAPSULE | Freq: Once | ORAL | Status: AC
  Administered 2021-03-25: 25 mg via ORAL
  Filled 2021-03-25: qty 1

## 2021-03-25 MED ORDER — SODIUM CHLORIDE 0.9 % IV SOLN: Freq: Once | INTRAVENOUS | Status: AC

## 2021-03-25 MED ORDER — TRASTUZUMAB-ANNS CHEMO 150 MG IV SOLR
6.0000 mg/kg | Freq: Once | INTRAVENOUS | Status: AC
  Administered 2021-03-25: 399 mg via INTRAVENOUS
  Filled 2021-03-25: qty 19

## 2021-03-25 MED ORDER — TAMOXIFEN CITRATE 20 MG PO TABS: 20.0000 mg | ORAL_TABLET | Freq: Every day | ORAL | 3 refills | Status: DC

## 2021-03-25 MED ORDER — SODIUM CHLORIDE 0.9% FLUSH
10.0000 mL | Freq: Once | INTRAVENOUS | Status: AC
  Administered 2021-03-25: 10 mL

## 2021-03-25 MED ORDER — HEPARIN SOD (PORK) LOCK FLUSH 100 UNIT/ML IV SOLN
500.0000 [IU] | Freq: Once | INTRAVENOUS | Status: AC | PRN
  Administered 2021-03-25: 500 [IU]

## 2021-03-25 MED ORDER — ACETAMINOPHEN 325 MG PO TABS
650.0000 mg | ORAL_TABLET | Freq: Once | ORAL | Status: AC
  Administered 2021-03-25: 650 mg via ORAL
  Filled 2021-03-25: qty 2

## 2021-03-25 MED ORDER — SODIUM CHLORIDE 0.9% FLUSH
10.0000 mL | INTRAVENOUS | Status: DC | PRN
  Administered 2021-03-25: 10 mL

## 2021-03-25 MED ORDER — SODIUM CHLORIDE 0.9 % IV SOLN
420.0000 mg | Freq: Once | INTRAVENOUS | Status: AC
  Administered 2021-03-25: 420 mg via INTRAVENOUS
  Filled 2021-03-25: qty 14

## 2021-03-25 NOTE — Patient Instructions (Signed)
[facility]  Discharge Instructions: Thank you for choosing [facility] to provide your oncology and hematology care.   If you have a lab appointment with the [facility], please go directly to the [facility] and check in at the registration area.   Wear comfortable clothing and clothing appropriate for easy access to any Portacath or PICC line.   We strive to give you quality time with your provider. You may need to reschedule your appointment if you arrive late (15 or more minutes).  Arriving late affects you and other patients whose appointments are after yours.  Also, if you miss three or more appointments without notifying the office, you may be dismissed from the clinic at the providers discretion.      For prescription refill requests, have your pharmacy contact our office and allow 72 hours for refills to be completed.    Today you received the following chemotherapy and/or immunotherapy agents: Kanjinti & Perjeta     To help prevent nausea and vomiting after your treatment, we encourage you to take your nausea medication as directed.  BELOW ARE SYMPTOMS THAT SHOULD BE REPORTED IMMEDIATELY: *FEVER GREATER THAN 100.4 F (38 C) OR HIGHER *CHILLS OR SWEATING *NAUSEA AND VOMITING THAT IS NOT CONTROLLED WITH YOUR NAUSEA MEDICATION *UNUSUAL SHORTNESS OF BREATH *UNUSUAL BRUISING OR BLEEDING *URINARY PROBLEMS (pain or burning when urinating, or frequent urination) *BOWEL PROBLEMS (unusual diarrhea, constipation, pain near the anus) TENDERNESS IN MOUTH AND THROAT WITH OR WITHOUT PRESENCE OF ULCERS (sore throat, sores in mouth, or a toothache) UNUSUAL RASH, SWELLING OR PAIN  UNUSUAL VAGINAL DISCHARGE OR ITCHING   Items with * indicate a potential emergency and should be followed up as soon as possible or go to the Emergency Department if any problems should occur.  Please show the CHEMOTHERAPY ALERT CARD or IMMUNOTHERAPY ALERT CARD at  check-in to the Emergency Department and triage nurse.  Should you have questions after your visit or need to cancel or reschedule your appointment, please contact [facility]  Dept: [phone]  and follow the prompts.  Office hours are 8:00 a.m. to 4:30 p.m. Monday - Friday. Please note that voicemails left after 4:00 p.m. may not be returned until the following business day.  We are closed weekends and major holidays. You have access to a nurse at all times for urgent questions. Please call the main number to the clinic Dept: [phone] and follow the prompts.   For any non-urgent questions, you may also contact your provider using MyChart. We now offer e-Visits for anyone 95 and older to request care online for non-urgent symptoms. For details visit [email or web address].   Also download the MyChart app! Go to the app store, search "MyChart", open the app, select , and log in with your MyChart username and password.  Due to Covid, a mask is required upon entering the hospital/clinic. If you do not have a mask, one will be given to you upon arrival. For doctor visits, patients may have 1 support person aged 67 or older with them. For treatment visits, patients cannot have anyone with them due to current Covid guidelines and our immunocompromised population.

## 2021-03-29 ENCOUNTER — Encounter: Payer: Self-pay | Admitting: Hematology and Oncology

## 2021-03-30 ENCOUNTER — Other Ambulatory Visit: Payer: Self-pay | Admitting: *Deleted

## 2021-03-30 MED ORDER — TAMOXIFEN CITRATE 10 MG PO TABS: 10.0000 mg | ORAL_TABLET | Freq: Two times a day (BID) | ORAL | 3 refills | Status: DC

## 2021-04-14 ENCOUNTER — Other Ambulatory Visit: Payer: Managed Care, Other (non HMO)

## 2021-04-14 ENCOUNTER — Ambulatory Visit: Payer: Managed Care, Other (non HMO)

## 2021-04-15 ENCOUNTER — Other Ambulatory Visit: Payer: Managed Care, Other (non HMO)

## 2021-04-15 ENCOUNTER — Inpatient Hospital Stay: Payer: Managed Care, Other (non HMO) | Attending: Hematology and Oncology

## 2021-04-15 ENCOUNTER — Ambulatory Visit: Payer: Managed Care, Other (non HMO)

## 2021-04-15 ENCOUNTER — Inpatient Hospital Stay: Payer: Managed Care, Other (non HMO)

## 2021-04-15 ENCOUNTER — Other Ambulatory Visit: Payer: Self-pay

## 2021-04-15 VITALS — BP 130/87 | HR 93 | Temp 98.3°F | Resp 18

## 2021-04-15 DIAGNOSIS — C50411 Malignant neoplasm of upper-outer quadrant of right female breast: Secondary | ICD-10-CM

## 2021-04-15 DIAGNOSIS — Z5112 Encounter for antineoplastic immunotherapy: Secondary | ICD-10-CM | POA: Diagnosis not present

## 2021-04-15 DIAGNOSIS — Z170 Estrogen receptor positive status [ER+]: Secondary | ICD-10-CM | POA: Diagnosis not present

## 2021-04-15 DIAGNOSIS — Z95828 Presence of other vascular implants and grafts: Secondary | ICD-10-CM

## 2021-04-15 DIAGNOSIS — I3139 Other pericardial effusion (noninflammatory): Secondary | ICD-10-CM | POA: Insufficient documentation

## 2021-04-15 LAB — COMPREHENSIVE METABOLIC PANEL
ALT: 11 U/L (ref 0–44)
AST: 15 U/L (ref 15–41)
Albumin: 3.7 g/dL (ref 3.5–5.0)
Alkaline Phosphatase: 104 U/L (ref 38–126)
Anion gap: 5 (ref 5–15)
BUN: 8 mg/dL (ref 6–20)
CO2: 28 mmol/L (ref 22–32)
Calcium: 8.7 mg/dL — ABNORMAL LOW (ref 8.9–10.3)
Chloride: 106 mmol/L (ref 98–111)
Creatinine, Ser: 0.65 mg/dL (ref 0.44–1.00)
GFR, Estimated: >60 mL/min (ref >60)
Glucose, Bld: 104 mg/dL — ABNORMAL HIGH (ref 70–99)
Potassium: 3.2 mmol/L — ABNORMAL LOW (ref 3.5–5.1)
Sodium: 139 mmol/L (ref 135–145)
Total Bilirubin: 0.4 mg/dL (ref 0.3–1.2)
Total Protein: 6.6 g/dL (ref 6.5–8.1)

## 2021-04-15 LAB — CBC WITH DIFFERENTIAL/PLATELET
Abs Immature Granulocytes: 0.02 10*3/uL (ref 0.00–0.07)
Basophils Absolute: 0 10*3/uL (ref 0.0–0.1)
Basophils Relative: 1 %
Eosinophils Absolute: 0 10*3/uL (ref 0.0–0.5)
Eosinophils Relative: 0 %
HCT: 29.6 % — ABNORMAL LOW (ref 36.0–46.0)
Hemoglobin: 9.6 g/dL — ABNORMAL LOW (ref 12.0–15.0)
Immature Granulocytes: 0 %
Lymphocytes Relative: 11 %
Lymphs Abs: 0.7 10*3/uL (ref 0.7–4.0)
MCH: 26.7 pg (ref 26.0–34.0)
MCHC: 32.4 g/dL (ref 30.0–36.0)
MCV: 82.2 fL (ref 80.0–100.0)
Monocytes Absolute: 0.7 10*3/uL (ref 0.1–1.0)
Monocytes Relative: 10 %
Neutro Abs: 5.1 10*3/uL (ref 1.7–7.7)
Neutrophils Relative %: 78 %
Platelets: 226 10*3/uL (ref 150–400)
RBC: 3.6 MIL/uL — ABNORMAL LOW (ref 3.87–5.11)
RDW: 14.9 % (ref 11.5–15.5)
WBC: 6.6 10*3/uL (ref 4.0–10.5)
nRBC: 0 % (ref 0.0–0.2)

## 2021-04-15 LAB — MAGNESIUM: Magnesium: 1.8 mg/dL (ref 1.7–2.4)

## 2021-04-15 MED ORDER — DIPHENHYDRAMINE HCL 25 MG PO CAPS
25.0000 mg | ORAL_CAPSULE | Freq: Once | ORAL | Status: AC
  Administered 2021-04-15: 25 mg via ORAL
  Filled 2021-04-15: qty 1

## 2021-04-15 MED ORDER — HEPARIN SOD (PORK) LOCK FLUSH 100 UNIT/ML IV SOLN
500.0000 [IU] | Freq: Once | INTRAVENOUS | Status: AC | PRN
  Administered 2021-04-15: 500 [IU]

## 2021-04-15 MED ORDER — SODIUM CHLORIDE 0.9% FLUSH
10.0000 mL | Freq: Once | INTRAVENOUS | Status: AC
  Administered 2021-04-15: 10 mL

## 2021-04-15 MED ORDER — TRASTUZUMAB-ANNS CHEMO 150 MG IV SOLR
6.0000 mg/kg | Freq: Once | INTRAVENOUS | Status: AC
  Administered 2021-04-15: 399 mg via INTRAVENOUS
  Filled 2021-04-15: qty 19

## 2021-04-15 MED ORDER — SODIUM CHLORIDE 0.9 % IV SOLN
420.0000 mg | Freq: Once | INTRAVENOUS | Status: AC
  Administered 2021-04-15: 420 mg via INTRAVENOUS
  Filled 2021-04-15: qty 14

## 2021-04-15 MED ORDER — ACETAMINOPHEN 325 MG PO TABS
650.0000 mg | ORAL_TABLET | Freq: Once | ORAL | Status: AC
  Administered 2021-04-15: 650 mg via ORAL
  Filled 2021-04-15: qty 2

## 2021-04-15 MED ORDER — SODIUM CHLORIDE 0.9% FLUSH
10.0000 mL | INTRAVENOUS | Status: DC | PRN
  Administered 2021-04-15: 10 mL

## 2021-04-15 MED ORDER — SODIUM CHLORIDE 0.9 % IV SOLN: Freq: Once | INTRAVENOUS | Status: AC

## 2021-04-15 NOTE — Progress Notes (Signed)
Patient declined 30 minute post observation period.  VSS.  No s/s or c/o distress or discomfort.

## 2021-04-18 ENCOUNTER — Ambulatory Visit
Admission: RE | Admit: 2021-04-18 | Discharge: 2021-04-18 | Disposition: A | Discharge status: Home / Self Care | Payer: Managed Care, Other (non HMO) | Source: Ambulatory Visit | Attending: Radiation Oncology | Admitting: Radiation Oncology

## 2021-04-18 DIAGNOSIS — Z170 Estrogen receptor positive status [ER+]: Secondary | ICD-10-CM

## 2021-04-18 NOTE — Progress Notes (Signed)
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 She developed fatigue and anticipated skin changes in the treatment field. She had been admitted early in the course of her radiation with a small bowel obstruction at the level of her prior surgical anastamosis from her history of crohn's disease. She missed a few treatments during this time. She was contemplating not continuing therapy, however she was able to finish this.    Impression/Plan: 1. 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 I was unable to reach the patient but left a voicemail and on the message, I  discussed that we would be happy to continue to follow her as needed, but she will also continue to follow up with Dr. Lindi Adie in medical oncology. She was counseled to call us back regarding questions or concerns she may have.     [name], PAC

## 2021-04-19 ENCOUNTER — Telehealth: Payer: Self-pay

## 2021-04-19 NOTE — Telephone Encounter (Signed)
-----   Message from Gardenia Phlegm, NP sent at 04/18/2021  3:49 PM EST ----- Please call patient about decreased potassium level.  She needs to take potassium supplementation. ----- Message ----- From: Benay Pike, MD Sent: 04/18/2021  10:08 AM EST To: [name], MD

## 2021-04-19 NOTE — Telephone Encounter (Signed)
Pt is aware of lab results, encouraged K+ rich diet and supplementation. Pt states she was sick for a few days d/t taking iron on an empty stomach and thinks this may be why her K+ is low.

## 2021-04-20 ENCOUNTER — Ambulatory Visit (HOSPITAL_COMMUNITY)
Admission: RE | Admit: 2021-04-20 | Discharge: 2021-04-20 | Disposition: A | Discharge status: Home / Self Care | Payer: Managed Care, Other (non HMO) | Source: Ambulatory Visit | Attending: Hematology and Oncology | Admitting: Hematology and Oncology

## 2021-04-20 ENCOUNTER — Other Ambulatory Visit: Payer: Self-pay

## 2021-04-20 DIAGNOSIS — Z170 Estrogen receptor positive status [ER+]: Secondary | ICD-10-CM | POA: Insufficient documentation

## 2021-04-20 DIAGNOSIS — C50411 Malignant neoplasm of upper-outer quadrant of right female breast: Secondary | ICD-10-CM | POA: Diagnosis not present

## 2021-04-20 DIAGNOSIS — Z0189 Encounter for other specified special examinations: Secondary | ICD-10-CM | POA: Diagnosis not present

## 2021-04-20 DIAGNOSIS — Z9221 Personal history of antineoplastic chemotherapy: Secondary | ICD-10-CM | POA: Diagnosis not present

## 2021-04-20 DIAGNOSIS — Z01818 Encounter for other preprocedural examination: Secondary | ICD-10-CM | POA: Diagnosis not present

## 2021-04-20 LAB — ECHOCARDIOGRAM COMPLETE
AR max vel: 2.16 cm2
AV Area VTI: 2.03 cm2
AV Area mean vel: 1.91 cm2
AV Mean grad: 2 mmHg
AV Peak grad: 2.7 mmHg
Ao pk vel: 0.82 m/s
Area-P 1/2: 6.12 cm2
S' Lateral: 2.7 cm

## 2021-04-26 ENCOUNTER — Encounter: Payer: Self-pay | Admitting: Hematology and Oncology

## 2021-04-27 ENCOUNTER — Encounter: Payer: Self-pay | Admitting: Hematology and Oncology

## 2021-04-28 ENCOUNTER — Ambulatory Visit: Payer: Managed Care, Other (non HMO) | Admitting: Plastic Surgery

## 2021-04-28 ENCOUNTER — Other Ambulatory Visit: Payer: Self-pay

## 2021-04-28 DIAGNOSIS — Z170 Estrogen receptor positive status [ER+]: Secondary | ICD-10-CM

## 2021-04-28 DIAGNOSIS — C50411 Malignant neoplasm of upper-outer quadrant of right female breast: Secondary | ICD-10-CM | POA: Diagnosis not present

## 2021-04-28 NOTE — Progress Notes (Signed)
° °  Referring Provider No referring provider defined for this encounter.   CC:  Chief Complaint  Patient presents with   Follow-up      [name] is an 46 y.o. female.  HPI: Patient presents to discuss the next day of her breast reconstruction.  She had bilateral nipple sparing mastectomies with tissue expander reconstruction.  Subsequently she had radiation and is getting Herceptin.  Her last radiation treatment was in November and her Herceptin treatment should be finished in March.  She would like to have her port removed and expanders exchanged in mid April if possible.  She feels generally happy with shape and size but does note that the areolar position is asymmetric between the sides.  She currently has 420 cc in her 375 cc expanders.  Review of Systems General: Denies fevers or chills  Physical Exam Vitals with BMI 04/15/2021 04/15/2021 03/25/2021  Height - - -  Weight - - -  BMI - - -  Systolic - 127 517  Diastolic - 87 70  Pulse 93 102 84    General:  No acute distress,  Alert and oriented, Non-Toxic, Normal speech and affect On exam she has some radiation changes on the right side.  The overall shape and position on the chest wall is reasonably symmetric with regards to her breast expander volume.  The areola on the left side is higher than on the right.  Assessment/Plan Patient will soon be ready for her second stage reconstruction.  I would plan to remove both expanders.  I may do a capsulotomy on the left side to try to encourage the implant to migrate a little higher and may take out some skin inferiorly to try and bring the areola more inferiorly to match the right side.  I did explain to her that there would be some risk involved in trying to raise the areola on the right side as its been radiated in could lead to wound healing problems.  We reviewed risks of the procedure that include bleeding, infection, damage surrounding structures need for additional procedures.   We may coordinate with [name] to get the port done at the same time.  All her questions were answered.  [name] 04/28/2021, 4:22 PM

## 2021-04-29 ENCOUNTER — Telehealth: Payer: Self-pay | Admitting: Hematology and Oncology

## 2021-04-29 NOTE — Telephone Encounter (Signed)
Sch per 1/19 inbasket, pt req to r/s 3/30, left msg .

## 2021-05-04 NOTE — Progress Notes (Signed)
Patient Care Team: Patient, [name] (Inactive) as PCP - General (General Practice) Courtney Kaufmann, RN as Oncology Nurse Navigator Courtney Germany, RN as Oncology Nurse Navigator  DIAGNOSIS:    ICD-10-CM   1. 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 palpable right breast mass measuring about 7 cm x 5 and half centimeters, palpable right axillary lymph node with concern for skin invasion    06/08/2020 Cancer Staging   Staging form: Breast, AJCC 8th Edition - Clinical: Stage IIIB (cT4d, cN1, cM0, G3, ER+, PR-, HER2+) - Signed by [name], MD on 07/07/2020 Histologic grading system: 3 grade system    06/24/2020 Genetic Testing   No pathogenic variants detected in [facility].  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  The report date is June 24, 2020.   The [facility]Can[employer]RNA[facility]ight panel offered by [facility] includes sequencing and rearrangement analysis for the following 47 genes:  APC, ATM, AXIN2, BARD1, BMPR1A, BRCA1, BRCA2, BRIP1, CDH1, CDK4, CDKN2A, CHEK2, DICER1, EPCAM, GREM1, HOXB13, MEN1, MLH1, MSH2, MSH3, MSH6, MUTYH, NBN, NF1, NF2, NTHL1, PALB2, PMS2, POLD1, POLE, PTEN, RAD51C, RAD51D, RECQL, RET, SDHA, SDHAF2, SDHB, SDHC, SDHD, SMAD4, SMARCA4, STK11, TP53, TSC1, TSC2, and VHL.  RNA data is routinely analyzed for use in variant interpretation for all genes.   06/25/2020 - 10/29/2020  Chemotherapy   TCHP x6 cycles   11/09/2020 Surgery   Bilateral mastectomies: Left mastectomy: Benign Right mastectomy: Residual microinvasive cancer status post neoadjuvant therapy, DCIS, 0/12 lymph nodes, ER +1%, PR negative, HER2 equivocal 2+ by [facility]   11/19/2020 -  Chemotherapy   Patient is on Treatment Plan : BREAST Trastuzumab  + Pertuzumab q21d x 13 cycles       CHIEF COMPLIANT: Cycle 9 Herceptin and Perjeta  INTERVAL HISTORY: [name] is a 46 y.o. with above-mentioned history of HER-2 positive right breast cancer currently on Herceptin Perjeta maintenance.  She is tolerating the treatment extremely well without any problems.  She had an echocardiogram that showed trace pericardial effusion and she was very concerned about it.  I tried to reassure her that this is a very trace finding and therefore is not clinically necessary to refer her to cardiology. She has not started tamoxifen yet. She has changed her diet significantly and became a pescatarian/Mediterranean diet.  ALLERGIES:  is allergic to other.  MEDICATIONS:  Current Outpatient Medications  Medication Sig Dispense Refill   acetaminophen (TYLENOL) 500 MG tablet Take 500-1,000 mg by mouth every 6 (six) hours as needed for mild pain.     lidocaine-prilocaine (EMLA) cream Apply 1 application topically as needed (Prior to port access). 30 g 2   mesalamine (LIALDA) 1.2 g EC tablet Take 2 tablets (2.4 g total) by mouth 2 (two) times daily with a meal. 120 tablet 11   polyethylene glycol (MIRALAX) 17 g packet Take 17 g by mouth daily as needed. 14 each 0  tamoxifen (NOLVADEX) 10 MG tablet Take 1 tablet (10 mg total) by mouth 2 (two) times daily. 90 tablet 3   No current facility-administered medications for this visit.    PHYSICAL EXAMINATION: ECOG PERFORMANCE STATUS: 1 - Symptomatic but completely ambulatory  Vitals:   05/06/21 0852  BP: (!) 144/90  Pulse: 97  Resp: 18  Temp: (!) 97.5 F (36.4 C)  SpO2: 99%    Filed Weights   05/06/21 0852  Weight: 148 lb 8 oz (67.4 kg)    BREAST: No palpable masses or nodules in either right or left breasts. No palpable axillary supraclavicular or infraclavicular adenopathy no breast tenderness or nipple discharge. (exam performed in the presence of a chaperone)  LABORATORY DATA:  I have reviewed the data as listed CMP Latest Ref Rng & Units 05/06/2021 04/15/2021 03/25/2021  Glucose 70 - 99 mg/dL 88 104(H) 85  BUN 6 - 20 mg/dL 11 8 11   Creatinine 0.44 - 1.00 mg/dL 0.76 0.65 0.78  Sodium 135 - 145 mmol/L 138 139 139  Potassium 3.5 - 5.1 mmol/L 3.8 3.2(L) 3.7  Chloride 98 - 111 mmol/L 106 106 107  CO2 22 - 32 mmol/L 25 28 22   Calcium 8.9 - 10.3 mg/dL 8.8(L) 8.7(L) 9.1  Total Protein 6.5 - 8.1 g/dL 7.0 6.6 7.6  Total Bilirubin 0.3 - 1.2 mg/dL 0.4 0.4 0.5  Alkaline Phos 38 - 126 U/L 107 104 121  AST 15 - 41 U/L 16 15 19   ALT 0 - 44 U/L 9 11 12     Lab Results  Component Value Date   WBC 5.4 05/06/2021   HGB 10.0 (L) 05/06/2021   HCT 31.4 (L) 05/06/2021   MCV 84.4 05/06/2021   PLT 236 05/06/2021   NEUTROABS 4.0 05/06/2021    ASSESSMENT & PLAN:  Malignant neoplasm of upper-outer quadrant of right breast in female, estrogen receptor positive ([place]) 06/03/2020, US guided biopsy of the right breast 9 0 clock mass showed grade III IDC; Prognostics showed ER 40% positive, weak staining, PR positive 0 %, Her 2 3 +, Ki 67 30% 4dCN1M0 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 palpable right breast mass measuring about 7 cm x 5 and half centimeters, palpable right axillary lymph node with concern for skin invasion    Treatment Plan: 1. Neoadjuvant chemo with TCHP q 3 weeks X 6 followed by HP 2. Bilateral mastectomies: Left mastectomy: Benign Right mastectomy: Residual microinvasive cancer status post neoadjuvant therapy, DCIS, 0/12 lymph nodes, ER +1%, PR negative, HER2 equivocal 2+ by IHC 3. Adj XRT completed 02/23/2021 4. Adj Anti-estrogen therapy with  tamoxifen to be started 06/08/2021 5. Neratinib --------------------------------------------------------------------------------------------------------- Current treatment: 1.  Herceptin Perjeta maintenance to be completed 07/07/2021 (her daughter is having a baby shower on 07/08/2021) 2. adjuvant tamoxifen started 04/24/2021   Echo 01/12/2021: EF of 60-65% Echo January 2023: EF 65% (trace pericardial effusion: I assured her that it is not clinically significant.)   tamoxifen toxicities: She has not started tamoxifen yet She changed her diet profoundly and she eats mostly fish and Mediterranean diet  Return to clinic every 3 weeks for Herceptin Perjeta maintenance and every 6 weeks to follow-up with me.    No orders of the defined types were placed in this encounter.  The patient has a good understanding of the overall plan. she agrees with it. she will call with any problems that may develop before the next visit here.  Total time spent: 30 mins including face to face time and  time spent for planning, charting and coordination of care  Rulon Eisenmenger, MD, MPH 05/06/2021  I, Thana Ates, am acting as scribe for Dr. [name].  I have reviewed the above documentation for accuracy and completeness, and I agree with the above.

## 2021-05-05 ENCOUNTER — Ambulatory Visit: Payer: Managed Care, Other (non HMO)

## 2021-05-05 ENCOUNTER — Ambulatory Visit: Payer: Managed Care, Other (non HMO) | Admitting: Hematology and Oncology

## 2021-05-05 ENCOUNTER — Other Ambulatory Visit: Payer: Managed Care, Other (non HMO)

## 2021-05-06 ENCOUNTER — Inpatient Hospital Stay: Payer: Managed Care, Other (non HMO) | Admitting: Dietician

## 2021-05-06 ENCOUNTER — Inpatient Hospital Stay: Payer: Managed Care, Other (non HMO)

## 2021-05-06 ENCOUNTER — Other Ambulatory Visit: Payer: Self-pay

## 2021-05-06 ENCOUNTER — Inpatient Hospital Stay (HOSPITAL_BASED_OUTPATIENT_CLINIC_OR_DEPARTMENT_OTHER): Payer: Managed Care, Other (non HMO) | Admitting: Hematology and Oncology

## 2021-05-06 VITALS — BP 131/92 | HR 74 | Temp 98.4°F | Resp 18

## 2021-05-06 DIAGNOSIS — Z5112 Encounter for antineoplastic immunotherapy: Secondary | ICD-10-CM | POA: Diagnosis not present

## 2021-05-06 DIAGNOSIS — C50411 Malignant neoplasm of upper-outer quadrant of right female breast: Secondary | ICD-10-CM | POA: Diagnosis not present

## 2021-05-06 DIAGNOSIS — Z95828 Presence of other vascular implants and grafts: Secondary | ICD-10-CM

## 2021-05-06 DIAGNOSIS — Z170 Estrogen receptor positive status [ER+]: Secondary | ICD-10-CM

## 2021-05-06 LAB — CBC WITH DIFFERENTIAL/PLATELET
Abs Immature Granulocytes: 0.01 10*3/uL (ref 0.00–0.07)
Basophils Absolute: 0 10*3/uL (ref 0.0–0.1)
Basophils Relative: 0 %
Eosinophils Absolute: 0.1 10*3/uL (ref 0.0–0.5)
Eosinophils Relative: 1 %
HCT: 31.4 % — ABNORMAL LOW (ref 36.0–46.0)
Hemoglobin: 10 g/dL — ABNORMAL LOW (ref 12.0–15.0)
Immature Granulocytes: 0 %
Lymphocytes Relative: 16 %
Lymphs Abs: 0.8 10*3/uL (ref 0.7–4.0)
MCH: 26.9 pg (ref 26.0–34.0)
MCHC: 31.8 g/dL (ref 30.0–36.0)
MCV: 84.4 fL (ref 80.0–100.0)
Monocytes Absolute: 0.5 10*3/uL (ref 0.1–1.0)
Monocytes Relative: 9 %
Neutro Abs: 4 10*3/uL (ref 1.7–7.7)
Neutrophils Relative %: 74 %
Platelets: 236 10*3/uL (ref 150–400)
RBC: 3.72 MIL/uL — ABNORMAL LOW (ref 3.87–5.11)
RDW: 14.6 % (ref 11.5–15.5)
WBC: 5.4 10*3/uL (ref 4.0–10.5)
nRBC: 0 % (ref 0.0–0.2)

## 2021-05-06 LAB — COMPREHENSIVE METABOLIC PANEL
ALT: 9 U/L (ref 0–44)
AST: 16 U/L (ref 15–41)
Albumin: 3.8 g/dL (ref 3.5–5.0)
Alkaline Phosphatase: 107 U/L (ref 38–126)
Anion gap: 7 (ref 5–15)
BUN: 11 mg/dL (ref 6–20)
CO2: 25 mmol/L (ref 22–32)
Calcium: 8.8 mg/dL — ABNORMAL LOW (ref 8.9–10.3)
Chloride: 106 mmol/L (ref 98–111)
Creatinine, Ser: 0.76 mg/dL (ref 0.44–1.00)
GFR, Estimated: >60 mL/min (ref >60)
Glucose, Bld: 88 mg/dL (ref 70–99)
Potassium: 3.8 mmol/L (ref 3.5–5.1)
Sodium: 138 mmol/L (ref 135–145)
Total Bilirubin: 0.4 mg/dL (ref 0.3–1.2)
Total Protein: 7 g/dL (ref 6.5–8.1)

## 2021-05-06 LAB — MAGNESIUM: Magnesium: 1.8 mg/dL (ref 1.7–2.4)

## 2021-05-06 MED ORDER — SODIUM CHLORIDE 0.9 % IV SOLN
420.0000 mg | Freq: Once | INTRAVENOUS | Status: AC
  Administered 2021-05-06: 420 mg via INTRAVENOUS
  Filled 2021-05-06: qty 14

## 2021-05-06 MED ORDER — HEPARIN SOD (PORK) LOCK FLUSH 100 UNIT/ML IV SOLN
500.0000 [IU] | Freq: Once | INTRAVENOUS | Status: AC | PRN
  Administered 2021-05-06: 500 [IU]

## 2021-05-06 MED ORDER — SODIUM CHLORIDE 0.9 % IV SOLN: Freq: Once | INTRAVENOUS | Status: AC

## 2021-05-06 MED ORDER — SODIUM CHLORIDE 0.9% FLUSH
10.0000 mL | INTRAVENOUS | Status: DC | PRN
  Administered 2021-05-06: 10 mL

## 2021-05-06 MED ORDER — DIPHENHYDRAMINE HCL 25 MG PO CAPS
25.0000 mg | ORAL_CAPSULE | Freq: Once | ORAL | Status: AC
  Administered 2021-05-06: 25 mg via ORAL
  Filled 2021-05-06: qty 1

## 2021-05-06 MED ORDER — SODIUM CHLORIDE 0.9% FLUSH
10.0000 mL | Freq: Once | INTRAVENOUS | Status: AC
  Administered 2021-05-06: 10 mL

## 2021-05-06 MED ORDER — ACETAMINOPHEN 325 MG PO TABS
650.0000 mg | ORAL_TABLET | Freq: Once | ORAL | Status: AC
  Administered 2021-05-06: 650 mg via ORAL
  Filled 2021-05-06: qty 2

## 2021-05-06 MED ORDER — TRASTUZUMAB-ANNS CHEMO 150 MG IV SOLR
6.0000 mg/kg | Freq: Once | INTRAVENOUS | Status: AC
  Administered 2021-05-06: 399 mg via INTRAVENOUS
  Filled 2021-05-06: qty 19

## 2021-05-06 NOTE — Progress Notes (Signed)
Pt declined 30 min post observation period after Pertuzumab. VSS upon discharge. Pt ambulatory with no complaints.

## 2021-05-06 NOTE — Assessment & Plan Note (Signed)
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 palpable right breast mass measuring about 7 cm x 5 and half centimeters, palpable right axillary lymph node with concern for skin invasion  Treatment Plan: 1. Neoadjuvant chemo with TCHP q 3 weeks X 6 followed by HP 2. Bilateral mastectomies: Left mastectomy: Benign Right mastectomy: Residual microinvasive cancer status post neoadjuvant therapy, DCIS, 0/12 lymph nodes, ER +1%, PR negative, HER2 equivocal 2+ by IHC 3. Adj XRT completed 02/23/2021 4. Adj Anti-estrogen therapy with tamoxifen started 04/24/2021 5. Neratinib --------------------------------------------------------------------------------------------------------- Current treatment: 1. Herceptin Perjeta maintenance 2. adjuvant tamoxifen started 04/24/2021  Echo 01/12/2021: EF of 60-65%  Tamoxifen toxicities:  Return to clinic every 3 weeks for Herceptin Perjeta maintenance and every 6 weeks to follow-up with me.

## 2021-05-10 ENCOUNTER — Encounter: Payer: Self-pay | Admitting: Hematology and Oncology

## 2021-05-18 ENCOUNTER — Other Ambulatory Visit: Payer: Self-pay | Admitting: Obstetrics and Gynecology

## 2021-05-18 DIAGNOSIS — E049 Nontoxic goiter, unspecified: Secondary | ICD-10-CM

## 2021-05-20 ENCOUNTER — Encounter: Payer: Self-pay | Admitting: Hematology and Oncology

## 2021-05-25 ENCOUNTER — Ambulatory Visit
Admission: RE | Admit: 2021-05-25 | Discharge: 2021-05-25 | Disposition: A | Discharge status: Home / Self Care | Payer: Managed Care, Other (non HMO) | Source: Ambulatory Visit | Attending: Obstetrics and Gynecology | Admitting: Obstetrics and Gynecology

## 2021-05-25 DIAGNOSIS — E049 Nontoxic goiter, unspecified: Secondary | ICD-10-CM

## 2021-05-26 ENCOUNTER — Other Ambulatory Visit: Payer: Managed Care, Other (non HMO)

## 2021-05-26 ENCOUNTER — Encounter: Payer: Self-pay | Admitting: *Deleted

## 2021-05-26 ENCOUNTER — Ambulatory Visit: Payer: Managed Care, Other (non HMO)

## 2021-05-27 ENCOUNTER — Inpatient Hospital Stay (HOSPITAL_BASED_OUTPATIENT_CLINIC_OR_DEPARTMENT_OTHER): Payer: Managed Care, Other (non HMO) | Admitting: Adult Health

## 2021-05-27 ENCOUNTER — Encounter (HOSPITAL_COMMUNITY): Payer: Self-pay | Admitting: Emergency Medicine

## 2021-05-27 ENCOUNTER — Other Ambulatory Visit: Payer: Managed Care, Other (non HMO)

## 2021-05-27 ENCOUNTER — Inpatient Hospital Stay (HOSPITAL_COMMUNITY): Payer: Managed Care, Other (non HMO)

## 2021-05-27 ENCOUNTER — Ambulatory Visit: Payer: Managed Care, Other (non HMO)

## 2021-05-27 ENCOUNTER — Inpatient Hospital Stay: Payer: Managed Care, Other (non HMO) | Attending: Hematology and Oncology

## 2021-05-27 ENCOUNTER — Other Ambulatory Visit: Payer: Self-pay

## 2021-05-27 ENCOUNTER — Inpatient Hospital Stay: Payer: Managed Care, Other (non HMO)

## 2021-05-27 ENCOUNTER — Encounter: Payer: Self-pay | Admitting: Adult Health

## 2021-05-27 ENCOUNTER — Encounter: Payer: Self-pay | Admitting: Hematology and Oncology

## 2021-05-27 ENCOUNTER — Emergency Department (HOSPITAL_COMMUNITY): Payer: Managed Care, Other (non HMO)

## 2021-05-27 ENCOUNTER — Inpatient Hospital Stay (HOSPITAL_COMMUNITY)
Admission: EM | Admit: 2021-05-27 | Discharge: 2021-05-30 | DRG: 389 | Disposition: A | Discharge status: Home / Self Care | Payer: Managed Care, Other (non HMO) | Attending: Family Medicine | Admitting: Family Medicine

## 2021-05-27 VITALS — BP 136/93 | HR 115 | Temp 100.0°F | Resp 196

## 2021-05-27 DIAGNOSIS — Z833 Family history of diabetes mellitus: Secondary | ICD-10-CM | POA: Diagnosis not present

## 2021-05-27 DIAGNOSIS — Z9013 Acquired absence of bilateral breasts and nipples: Secondary | ICD-10-CM

## 2021-05-27 DIAGNOSIS — R1114 Bilious vomiting: Secondary | ICD-10-CM | POA: Diagnosis not present

## 2021-05-27 DIAGNOSIS — Z79899 Other long term (current) drug therapy: Secondary | ICD-10-CM | POA: Diagnosis not present

## 2021-05-27 DIAGNOSIS — D500 Iron deficiency anemia secondary to blood loss (chronic): Secondary | ICD-10-CM | POA: Diagnosis present

## 2021-05-27 DIAGNOSIS — Z4659 Encounter for fitting and adjustment of other gastrointestinal appliance and device: Secondary | ICD-10-CM

## 2021-05-27 DIAGNOSIS — Z803 Family history of malignant neoplasm of breast: Secondary | ICD-10-CM

## 2021-05-27 DIAGNOSIS — Z9049 Acquired absence of other specified parts of digestive tract: Secondary | ICD-10-CM | POA: Diagnosis not present

## 2021-05-27 DIAGNOSIS — Z888 Allergy status to other drugs, medicaments and biological substances status: Secondary | ICD-10-CM

## 2021-05-27 DIAGNOSIS — Z0189 Encounter for other specified special examinations: Secondary | ICD-10-CM

## 2021-05-27 DIAGNOSIS — K565 Intestinal adhesions [bands], unspecified as to partial versus complete obstruction: Principal | ICD-10-CM | POA: Diagnosis present

## 2021-05-27 DIAGNOSIS — K56609 Unspecified intestinal obstruction, unspecified as to partial versus complete obstruction: Secondary | ICD-10-CM

## 2021-05-27 DIAGNOSIS — K50818 Crohn's disease of both small and large intestine with other complication: Secondary | ICD-10-CM | POA: Diagnosis present

## 2021-05-27 DIAGNOSIS — C50411 Malignant neoplasm of upper-outer quadrant of right female breast: Secondary | ICD-10-CM | POA: Insufficient documentation

## 2021-05-27 DIAGNOSIS — D72829 Elevated white blood cell count, unspecified: Secondary | ICD-10-CM

## 2021-05-27 DIAGNOSIS — K50819 Crohn's disease of both small and large intestine with unspecified complications: Secondary | ICD-10-CM | POA: Diagnosis present

## 2021-05-27 DIAGNOSIS — Z87891 Personal history of nicotine dependence: Secondary | ICD-10-CM | POA: Diagnosis not present

## 2021-05-27 DIAGNOSIS — Z170 Estrogen receptor positive status [ER+]: Secondary | ICD-10-CM

## 2021-05-27 DIAGNOSIS — Z95828 Presence of other vascular implants and grafts: Secondary | ICD-10-CM

## 2021-05-27 DIAGNOSIS — K50812 Crohn's disease of both small and large intestine with intestinal obstruction: Secondary | ICD-10-CM | POA: Diagnosis present

## 2021-05-27 DIAGNOSIS — Z20822 Contact with and (suspected) exposure to covid-19: Secondary | ICD-10-CM | POA: Diagnosis present

## 2021-05-27 DIAGNOSIS — Z5112 Encounter for antineoplastic immunotherapy: Secondary | ICD-10-CM | POA: Insufficient documentation

## 2021-05-27 LAB — CBC WITH DIFFERENTIAL/PLATELET
Abs Immature Granulocytes: 0.03 10*3/uL (ref 0.00–0.07)
Basophils Absolute: 0 10*3/uL (ref 0.0–0.1)
Basophils Relative: 0 %
Eosinophils Absolute: 0 10*3/uL (ref 0.0–0.5)
Eosinophils Relative: 0 %
HCT: 41.2 % (ref 36.0–46.0)
Hemoglobin: 13.7 g/dL (ref 12.0–15.0)
Immature Granulocytes: 0 %
Lymphocytes Relative: 3 %
Lymphs Abs: 0.4 10*3/uL — ABNORMAL LOW (ref 0.7–4.0)
MCH: 26.9 pg (ref 26.0–34.0)
MCHC: 33.3 g/dL (ref 30.0–36.0)
MCV: 80.9 fL (ref 80.0–100.0)
Monocytes Absolute: 0.7 10*3/uL (ref 0.1–1.0)
Monocytes Relative: 5 %
Neutro Abs: 12.8 10*3/uL — ABNORMAL HIGH (ref 1.7–7.7)
Neutrophils Relative %: 92 %
Platelets: 321 10*3/uL (ref 150–400)
RBC: 5.09 MIL/uL (ref 3.87–5.11)
RDW: 13.9 % (ref 11.5–15.5)
WBC: 14 10*3/uL — ABNORMAL HIGH (ref 4.0–10.5)
nRBC: 0 % (ref 0.0–0.2)

## 2021-05-27 LAB — COMPREHENSIVE METABOLIC PANEL
ALT: 15 U/L (ref 0–44)
AST: 21 U/L (ref 15–41)
Albumin: 4.9 g/dL (ref 3.5–5.0)
Alkaline Phosphatase: 134 U/L — ABNORMAL HIGH (ref 38–126)
Anion gap: 11 (ref 5–15)
BUN: 15 mg/dL (ref 6–20)
CO2: 26 mmol/L (ref 22–32)
Calcium: 10.3 mg/dL (ref 8.9–10.3)
Chloride: 99 mmol/L (ref 98–111)
Creatinine, Ser: 0.85 mg/dL (ref 0.44–1.00)
GFR, Estimated: >60 mL/min (ref >60)
Glucose, Bld: 150 mg/dL — ABNORMAL HIGH (ref 70–99)
Potassium: 3.7 mmol/L (ref 3.5–5.1)
Sodium: 136 mmol/L (ref 135–145)
Total Bilirubin: 0.9 mg/dL (ref 0.3–1.2)
Total Protein: 8.6 g/dL — ABNORMAL HIGH (ref 6.5–8.1)

## 2021-05-27 LAB — RESP PANEL BY RT-PCR (FLU A&B, COVID) ARPGX2
Influenza A by PCR: NEGATIVE
Influenza B by PCR: NEGATIVE
SARS Coronavirus 2 by RT PCR: NEGATIVE

## 2021-05-27 LAB — LIPASE, BLOOD: Lipase: 37 U/L (ref 11–51)

## 2021-05-27 LAB — MAGNESIUM: Magnesium: 1.9 mg/dL (ref 1.7–2.4)

## 2021-05-27 MED ORDER — PHENOL 1.4 % MT LIQD
2.0000 | OROMUCOSAL | Status: DC | PRN
  Administered 2021-05-28 (×2): 2 via OROMUCOSAL
  Filled 2021-05-27 (×2): qty 177

## 2021-05-27 MED ORDER — MAGIC MOUTHWASH
15.0000 mL | Freq: Four times a day (QID) | ORAL | Status: DC | PRN
  Filled 2021-05-27: qty 15

## 2021-05-27 MED ORDER — ALUM & MAG HYDROXIDE-SIMETH 200-200-20 MG/5ML PO SUSP: 30.0000 mL | Freq: Four times a day (QID) | ORAL | Status: DC | PRN

## 2021-05-27 MED ORDER — HYDRALAZINE HCL 20 MG/ML IJ SOLN: 10.0000 mg | Freq: Four times a day (QID) | INTRAMUSCULAR | Status: DC | PRN

## 2021-05-27 MED ORDER — LACTATED RINGERS IV BOLUS
1000.0000 mL | Freq: Three times a day (TID) | INTRAVENOUS | Status: AC | PRN
Start: 2021-05-27 — End: 2021-05-29

## 2021-05-27 MED ORDER — SODIUM CHLORIDE (PF) 0.9 % IJ SOLN
INTRAMUSCULAR | Status: AC
  Filled 2021-05-27: qty 50

## 2021-05-27 MED ORDER — IOHEXOL 300 MG/ML  SOLN
100.0000 mL | Freq: Once | INTRAMUSCULAR | Status: AC | PRN
  Administered 2021-05-27: 100 mL via INTRAVENOUS

## 2021-05-27 MED ORDER — LACTATED RINGERS IV BOLUS
1000.0000 mL | Freq: Once | INTRAVENOUS | Status: AC
  Administered 2021-05-27: 1000 mL via INTRAVENOUS

## 2021-05-27 MED ORDER — PROCHLORPERAZINE EDISYLATE 10 MG/2ML IJ SOLN: 5.0000 mg | INTRAMUSCULAR | Status: DC | PRN

## 2021-05-27 MED ORDER — ONDANSETRON HCL 4 MG/2ML IJ SOLN: 4.0000 mg | Freq: Four times a day (QID) | INTRAMUSCULAR | Status: DC | PRN

## 2021-05-27 MED ORDER — METHOCARBAMOL 1000 MG/10ML IJ SOLN
1000.0000 mg | Freq: Four times a day (QID) | INTRAVENOUS | Status: DC | PRN
  Filled 2021-05-27: qty 10

## 2021-05-27 MED ORDER — ENOXAPARIN SODIUM 40 MG/0.4ML IJ SOSY: 40.0000 mg | PREFILLED_SYRINGE | INTRAMUSCULAR | Status: DC

## 2021-05-27 MED ORDER — ONDANSETRON HCL 4 MG PO TABS: 4.0000 mg | ORAL_TABLET | Freq: Four times a day (QID) | ORAL | Status: DC | PRN

## 2021-05-27 MED ORDER — LACTATED RINGERS IV SOLN: INTRAVENOUS | Status: DC

## 2021-05-27 MED ORDER — HYDROMORPHONE HCL 1 MG/ML IJ SOLN: 0.5000 mg | INTRAMUSCULAR | Status: DC | PRN

## 2021-05-27 MED ORDER — HYDROMORPHONE HCL 1 MG/ML IJ SOLN
1.0000 mg | Freq: Once | INTRAMUSCULAR | Status: AC
  Administered 2021-05-27: 1 mg via INTRAVENOUS
  Filled 2021-05-27: qty 1

## 2021-05-27 MED ORDER — DIATRIZOATE MEGLUMINE & SODIUM 66-10 % PO SOLN
90.0000 mL | Freq: Once | ORAL | Status: AC
  Administered 2021-05-27: 90 mL via NASOGASTRIC
  Filled 2021-05-27: qty 90

## 2021-05-27 MED ORDER — LIP MEDEX EX OINT
1.0000 "application " | TOPICAL_OINTMENT | Freq: Two times a day (BID) | CUTANEOUS | Status: DC
  Administered 2021-05-27 – 2021-05-30 (×5): 1 via TOPICAL
  Filled 2021-05-27 (×2): qty 7

## 2021-05-27 MED ORDER — SODIUM CHLORIDE 0.9% FLUSH
10.0000 mL | Freq: Once | INTRAVENOUS | Status: AC
  Administered 2021-05-27: 10 mL

## 2021-05-27 MED ORDER — ACETAMINOPHEN 325 MG PO TABS
650.0000 mg | ORAL_TABLET | Freq: Four times a day (QID) | ORAL | Status: DC | PRN
  Filled 2021-05-27: qty 2

## 2021-05-27 MED ORDER — ONDANSETRON HCL 4 MG/2ML IJ SOLN
4.0000 mg | Freq: Once | INTRAMUSCULAR | Status: AC
  Administered 2021-05-27: 4 mg via INTRAVENOUS
  Filled 2021-05-27: qty 2

## 2021-05-27 MED ORDER — MENTHOL 3 MG MT LOZG
1.0000 | LOZENGE | OROMUCOSAL | Status: DC | PRN
  Filled 2021-05-27: qty 9

## 2021-05-27 MED ORDER — DIPHENHYDRAMINE HCL 50 MG/ML IJ SOLN: 12.5000 mg | Freq: Four times a day (QID) | INTRAMUSCULAR | Status: DC | PRN

## 2021-05-27 MED ORDER — ACETAMINOPHEN 650 MG RE SUPP: 650.0000 mg | Freq: Four times a day (QID) | RECTAL | Status: DC | PRN

## 2021-05-27 MED ORDER — SODIUM CHLORIDE 0.9 % IV SOLN
8.0000 mg | Freq: Four times a day (QID) | INTRAVENOUS | Status: DC | PRN
Start: 2021-05-27 — End: 2021-05-30
  Filled 2021-05-27: qty 4

## 2021-05-27 MED ORDER — BISACODYL 10 MG RE SUPP
10.0000 mg | Freq: Two times a day (BID) | RECTAL | Status: DC | PRN
Start: 2021-05-27 — End: 2021-05-30

## 2021-05-27 NOTE — ED Provider Notes (Signed)
[facility] DEPT Provider Note   CSN: [id number] Arrival date & time: 05/27/21  1451     History  Chief Complaint  Patient presents with   Hiccups   Nausea    [name] is a 46 y.o. female.  46 yo F with a chief complaints of abdominal pain nausea and vomiting.  Patient states it feels like when she had Crohn's previously.  She went to see her oncologist today and they were concerned about her abdominal discomfort and sent her down to the ED for evaluation.  Had a temperature just over 100 in the oncology office.  Had blood work there with a leukocytosis and they suggested she come here.  The history is provided by the patient.  Illness     Home Medications Prior to Admission medications   Medication Sig Start Date End Date Taking? Authorizing Provider  acetaminophen (TYLENOL) 500 MG tablet Take 500-1,000 mg by mouth every 6 (six) hours as needed for mild pain.    [provider]  lidocaine-prilocaine (EMLA) cream Apply 1 application topically as needed (Prior to port access). 06/16/20   Benay Pike, MD  mesalamine (LIALDA) 1.2 g EC tablet Take 2 tablets (2.4 g total) by mouth 2 (two) times daily with a meal. 07/22/20   [name], MD  polyethylene glycol (MIRALAX) 17 g packet Take 17 g by mouth daily as needed. 01/28/21   Maczis, Barth Kirks, PA-C  tamoxifen (NOLVADEX) 10 MG tablet Take 1 tablet (10 mg total) by mouth 2 (two) times daily. 03/30/21   [name], MD  prochlorperazine (COMPAZINE) 10 MG tablet Take 1 tablet (10 mg total) by mouth every 6 (six) hours as needed (Nausea or vomiting). 06/16/20 11/19/20  Benay Pike, MD      Allergies    Other    Review of Systems   Review of Systems  Physical Exam Updated Vital Signs BP (!) 147/110    Pulse 96    Temp 98.5 F (36.9 C) (Oral)    Resp (!) 24    Ht 5' 4"  (1.626 m)    Wt 67 kg    SpO2 98%    BMI 25.35 kg/m  Physical Exam Vitals and nursing note reviewed.   Constitutional:      General: She is not in acute distress.    Appearance: She is well-developed. She is not diaphoretic.  HENT:     Head: Normocephalic and atraumatic.  Eyes:     Pupils: Pupils are equal, round, and reactive to light.  Cardiovascular:     Rate and Rhythm: Normal rate and regular rhythm.     Heart sounds: No murmur heard.   No friction rub. No gallop.  Pulmonary:     Effort: Pulmonary effort is normal.     Breath sounds: No wheezing or rales.  Abdominal:     General: There is no distension.     Palpations: Abdomen is soft.     Tenderness: There is no abdominal tenderness.  Musculoskeletal:        General: Tenderness (mild diffuse pain) present.     Cervical back: Normal range of motion and neck supple.  Skin:    General: Skin is warm and dry.  Neurological:     Mental Status: She is alert and oriented to person, place, and time.  Psychiatric:        Behavior: Behavior normal.    ED Results / Procedures / Treatments   Labs (all labs  ordered are listed, but only abnormal results are displayed) Labs Reviewed  LIPASE, BLOOD    EKG None  Radiology No results found.  Procedures Procedures    Medications Ordered in ED Medications  HYDROmorphone (DILAUDID) injection 1 mg (has no administration in time range)  ondansetron (ZOFRAN) injection 4 mg (has no administration in time range)    ED Course/ Medical Decision Making/ A&P                           Medical Decision Making Amount and/or Complexity of Data Reviewed Radiology: ordered.  Risk Prescription drug management. Decision regarding hospitalization.   Patient is a 45 y.o. female with a cc of abdominal pain.  Has a history of Crohn's and she thinks is typical of her Crohn's flare.  Going on for about 48 hours or so.  Is being treated currently for breast cancer and so is gone to the oncology center.  She was sent in here for evaluation.  I reviewed their notes they document bilious emesis  though not obviously bilious by my history.  We will obtain CT imaging treat pain and nausea IV fluids reassess..  I was concerned for the possibility of Crohn's flare versus small bowel obstruction and so obtained CT imaging.  CT scan is consistent with a small bowel obstruction with the transition point.  I discussed this with [place] gastroenterology who without obvious inflammation on CT thought could be primary postsurgical pathology we will hold off on any specific Crohn's therapy at this time.  They recommended discussion with general surgery.  I discussed case with Dr. Johney Maine who recommended placing an NG tube and they would evaluate at bedside.  Discussed with medicine for admission.    The patients results and plan were reviewed and discussed.   Any x-rays performed were independently reviewed by myself.   Differential diagnosis were considered with the presenting HPI.  Medications  diatrizoate meglumine-sodium (GASTROGRAFIN) 66-10 % solution 90 mL (has no administration in time range)  lactated ringers bolus 1,000 mL (has no administration in time range)  HYDROmorphone (DILAUDID) injection 0.5-2 mg (has no administration in time range)  methocarbamol (ROBAXIN) 1,000 mg in dextrose 5 % 100 mL IVPB (has no administration in time range)  ondansetron (ZOFRAN) injection 4 mg (has no administration in time range)    Or  ondansetron (ZOFRAN) 8 mg in sodium chloride 0.9 % 50 mL IVPB (has no administration in time range)  prochlorperazine (COMPAZINE) injection 5-10 mg (has no administration in time range)  lip balm (CARMEX) ointment 1 application (has no administration in time range)  phenol (CHLORASEPTIC) mouth spray 2 spray (has no administration in time range)  menthol-cetylpyridinium (CEPACOL) lozenge 3 mg (has no administration in time range)  magic mouthwash (has no administration in time range)  alum & mag hydroxide-simeth (MAALOX/MYLANTA) 200-200-20 MG/5ML suspension 30 mL (has no  administration in time range)  bisacodyl (DULCOLAX) suppository 10 mg (has no administration in time range)  diphenhydrAMINE (BENADRYL) injection 12.5-25 mg (has no administration in time range)  lactated ringers infusion (has no administration in time range)  acetaminophen (TYLENOL) tablet 650 mg (has no administration in time range)    Or  acetaminophen (TYLENOL) suppository 650 mg (has no administration in time range)  ondansetron (ZOFRAN) tablet 4 mg (has no administration in time range)    Or  ondansetron (ZOFRAN) injection 4 mg (has no administration in time range)  hydrALAZINE (APRESOLINE) injection 10 mg (  has no administration in time range)  HYDROmorphone (DILAUDID) injection 1 mg (1 mg Intravenous Given 05/27/21 1523)  ondansetron (ZOFRAN) injection 4 mg (4 mg Intravenous Given 05/27/21 1522)  iohexol (OMNIPAQUE) 300 MG/ML solution 100 mL (100 mLs Intravenous Contrast Given 05/27/21 1544)  sodium chloride (PF) 0.9 % injection (  Given by Other 05/27/21 1628)  lactated ringers bolus 1,000 mL (1,000 mLs Intravenous New Bag/Given 05/27/21 1654)    Vitals:   05/27/21 1530 05/27/21 1606 05/27/21 1641 05/27/21 1645  BP: (!) 129/98 119/83 (!) 113/95 (!) 119/91  Pulse: (!) 112 (!) 103 100 (!) 105  Resp: 20 16 13 17   Temp:      TempSrc:      SpO2: 96% 95% 96% 96%  Weight:      Height:        Final diagnoses:  Encounter for imaging study to confirm nasogastric (NG) tube placement  Small bowel obstruction ([place])    Admission/ observation were discussed with the admitting physician, patient and/or family and they are comfortable with the plan.         Final Clinical Impression(s) / ED Diagnoses Final diagnoses:  None    Rx / DC Orders ED Discharge Orders     None         Deno Etienne, DO 05/27/21 1705

## 2021-05-27 NOTE — Consult Note (Signed)
[name]  [birth year] [id number]  CARE TEAM:  PCP: Merryl Hacker, No  Outpatient Care Team: Patient Care Team: Pcp, No as PCP - General [name], RN as Oncology Nurse Navigator [name], RN as Oncology Nurse Navigator [name], MD as Consulting Physician (Colon and Rectal Surgery) [name], MD as Consulting Physician (Gastroenterology) [name], MD as Consulting Physician (General Surgery) [name], MD as Consulting Physician (Hematology and Oncology)  Inpatient Treatment Team: Treatment Team: Attending Provider: Deno Etienne, DO; Registered Nurse: Garrison, [place] N, RN; Technician: Hilary Hertz Consulting Physician: Nolon Nations, MD; Rounding Team: Jackelyn Knife, MD   This patient is a 46 y.o.female who presents today for surgical evaluation at the request of Dr Tyrone Nine.   Chief complaint / Reason for evaluation: Small bowel obstruction.  Woman with history of Crohn disease.  Significant small bowel large intestinal disease.  Ultimately required abdominal colectomy with ileorectal anastomosis by my partner, Dr. Marcello Moores, 2021.  On maintenance Lialda. Also with breast cancer under getting chemotherapy as well.  Was admitted in the fall with small bowel obstruction that improved with nasogastric tube decompression.  Flexible sigmoidoscopy at that time showed some mild irritation disease but no major Crohn's flares.  She improved and was discharged 48 hours later.  Patient undergoing immunotherapy chemotherapy through the cancer center through Dr. Sonny Dandy.  Came today feeling worse with nausea vomiting crampy abdominal pain since last night.  Recommended transfer to the emergency department.  Examination and CT scan findings concerning for small bowel obstruction.  Gastroenterology and surgical consultations requested.  Patient here with daughter.  She noted her crampy abdominal pain started last night.  Some nausea.  Became more intense.  Felt  like the obstruction she had last fall.  Patient notes that she usually moves his bowels about 2-3 times a day.  Does not need to take any antidiarrheals.  Denies any sick contacts or travel history.  No hematochezia or melena.  No alteration in her bowels.   Assessment  [name]  46 y.o. 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  No major evidence of small bowel inflammation that would suggest recurrent Crohn's at this time.  Plan:  IV fluid resuscitation.  Nausea and pain control.  Nasogastric tube decompression.  Small bowel protocol.  Surgery will help follow but there is no need for emergent surgery at this time.  Dr. Marcello Moores, her colorectal surgeon, is on this weekend and can provide any evaluation / insights.  Agree by the CAT scan there is no major inflation that would argue towards a Crohn's flare but may need to be revisited with gastroenterology if persistent problem.  We will try and hold off on abdominal exploration and lysis of adhesions unless this keeps happening more often or if she fails small bowel protocol.  Last time was just 48 hours and that is the only other episode.  Hopefully it will be a safe and quick turnaround.  We will see.  -VTE prophylaxis- SCDs, etc -mobilize as tolerated to help recovery  40 minutes spent in  review, evaluation, examination, counseling, and coordination of care.  More than 50% of that time was spent in counseling.  Adin Hector, MD, FACS, MASCRS Esophageal, Gastrointestinal & Colorectal Surgery Robotic and Minimally Invasive  Surgery  [facility], [place]  [place]. [address], [place], [place] [address] [phone] Fax [phone] Main  CONTACT INFORMATION:  Weekday (9AM-5PM): Call [facility] main office at [phone]  Weeknight (5PM-9AM) or Weekend/Holiday: Check [email or web address] (password " [email or web address]") for General Surgery [facility] coverage  (Please, do not use SecureChat as it is not reliable communication to operating surgeons for immediate patient care)      05/27/2021      Past Medical History:  Diagnosis Date   Cancer [facility])    Colon stricture ([place]) 05/07/2019   Crohn's colitis, with intestinal obstruction ([place]) 09/17/2013   Dx 2006 - right and left colon involved 2011 - pan-colitis 09/17/2013 left colitis 60-20 cm March 2021 subtotal colectomy ileosigmoid anastomosis for colonic strictures-Dr. Marcello Moores     Crohn's disease [facility])    Family history of breast cancer 06/11/2020   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 10/17/2010   Rectal bleeding    Uterine fibroid    Vitamin D deficiency 09/24/2013    Past Surgical History:  Procedure Laterality Date   AXILLARY LYMPH NODE DISSECTION Right 11/09/2020   Procedure: RIGHT AXILLARY LYMPH NODE DISSECTION;  Surgeon: [name], MD;  Location: [place];  Service: General;  Laterality: Right;   BIOPSY  07/01/2020   Procedure: BIOPSY;  Surgeon: Yetta Flock, MD;  Location: [facility];  Service: Gastroenterology;;   BIOPSY  01/27/2021   Procedure: BIOPSY;  Surgeon: Yetta Flock, MD;  Location: [facility];  Service: Gastroenterology;;   BREAST RECONSTRUCTION WITH PLACEMENT OF TISSUE EXPANDER AND FLEX HD (ACELLULAR HYDRATED DERMIS) Bilateral 11/09/2020   Procedure: BILATERAL BREAST RECONSTRUCTION WITH PLACEMENT OF TISSUE EXPANDER AND FLEX HD (ACELLULAR HYDRATED DERMIS);  Surgeon: [name], MD;  Location: [place];  Service: Plastics;  Laterality: Bilateral;   COLECTOMY  06/27/2019   COLONOSCOPY  2015   FLEXIBLE SIGMOIDOSCOPY N/A 07/01/2020   Procedure: FLEXIBLE SIGMOIDOSCOPY;  Surgeon: Yetta Flock, MD;  [facility];  Service: Gastroenterology;  Laterality: N/A;   FLEXIBLE SIGMOIDOSCOPY N/A 01/27/2021   Procedure: FLEXIBLE SIGMOIDOSCOPY;  Surgeon: Yetta Flock, MD;  [facility];  Service: Gastroenterology;  Laterality: N/A;   NIPPLE SPARING MASTECTOMY Bilateral 11/09/2020   Procedure: BILATERAL NIPPLE SPARING MASTECTOMY;  Surgeon: [name], MD;  Location: [place];  Service: General;  Laterality: Bilateral;   PORTACATH PLACEMENT N/A 06/23/2020   Procedure: INSERTION PORT-A-CATH;  Surgeon: [name], MD;  Location: [place];  Service: General;  Laterality: N/A;  START TIME OF 11:00 AM FOR 60 MINUTES [place] [facility]    Social History   Socioeconomic History   Marital status: Significant Other    Spouse name: Not on file   Number of children: 1   Years of education: 13   Highest education level: Not on file  Occupational History   Occupation: Medical Coder   Tobacco Use   Smoking status: Former    Types: Cigarettes   Smokeless tobacco: Never  Vaping Use   Vaping Use: Never used  Substance and Sexual Activity   Alcohol use: Yes    Alcohol/week: 1.0 - 2.0 standard drink    Types: 1 -  2 Glasses of wine per week   Drug use: No   Sexual activity: Yes    Partners: Male    Comment: Intermittent protection not consistent  Other Topics Concern   Not on file  Social History Narrative   Single, 1 daughter born 2001 approximately   Medical coder for [employer]   She is a former smoker, occasional alcohol no drug use   Social Determinants of Radio broadcast assistant Strain: Not on file  Food Insecurity: Not on file  Transportation Needs: Not on file  Physical Activity: Not on file  Stress: Not on file   Social Connections: Not on file  Intimate Partner Violence: Not At Risk   Fear of Current or Ex-Partner: No   Emotionally Abused: No   Physically Abused: No   Sexually Abused: No    Family History  Problem Relation Age of Onset   Breast cancer Maternal Aunt        dx unknown age   Diabetes Father    Diabetes Paternal Aunt    Breast cancer Cousin        maternal cousin; dx unknown age   Esophageal cancer Neg Hx    Rectal cancer Neg Hx    Stomach cancer Neg Hx    Colon polyps Neg Hx    Colon cancer Neg Hx     Current Facility-Administered Medications  Medication Dose Route Frequency Provider Last Rate Last Admin   alum & mag hydroxide-simeth (MAALOX/MYLANTA) 200-200-20 MG/5ML suspension 30 mL  30 mL Oral Q6H PRN Michael Boston, MD       bisacodyl (DULCOLAX) suppository 10 mg  10 mg Rectal Q12H PRN Michael Boston, MD       diatrizoate meglumine-sodium (GASTROGRAFIN) 66-10 % solution 90 mL  90 mL Per NG tube Once Michael Boston, MD       diphenhydrAMINE (BENADRYL) injection 12.5-25 mg  12.5-25 mg Intravenous Q6H PRN Michael Boston, MD       HYDROmorphone (DILAUDID) injection 0.5-2 mg  0.5-2 mg Intravenous Q2H PRN Michael Boston, MD       lactated ringers bolus 1,000 mL  1,000 mL Intravenous Once Michael Boston, MD       lactated ringers bolus 1,000 mL  1,000 mL Intravenous Q8H PRN Omarian Jaquith, Remo Lipps, MD       lip balm (CARMEX) ointment 1 application  1 application Topical BID Michael Boston, MD       magic mouthwash  15 mL Oral QID PRN Michael Boston, MD       menthol-cetylpyridinium (CEPACOL) lozenge 3 mg  1 lozenge Oral PRN Michael Boston, MD       methocarbamol (ROBAXIN) 1,000 mg in dextrose 5 % 100 mL IVPB  1,000 mg Intravenous Q6H PRN Michael Boston, MD       ondansetron (ZOFRAN) injection 4 mg  4 mg Intravenous Q6H PRN Michael Boston, MD       Or   ondansetron (ZOFRAN) 8 mg in sodium chloride 0.9 % 50 mL IVPB  8 mg Intravenous Q6H PRN Michael Boston, MD       phenol (CHLORASEPTIC) mouth  spray 2 spray  2 spray Mouth/Throat PRN Michael Boston, MD       prochlorperazine (COMPAZINE) injection 5-10 mg  5-10 mg Intravenous Q4H PRN Michael Boston, MD       Current Outpatient Medications  Medication Sig Dispense Refill   acetaminophen (TYLENOL) 500 MG tablet Take 500-1,000 mg by mouth every 6 (six) hours as needed for mild pain.  lidocaine-prilocaine (EMLA) cream Apply 1 application topically as needed (Prior to port access). 30 g 2   mesalamine (LIALDA) 1.2 g EC tablet Take 2 tablets (2.4 g total) by mouth 2 (two) times daily with a meal. 120 tablet 11   polyethylene glycol (MIRALAX) 17 g packet Take 17 g by mouth daily as needed. 14 each 0   tamoxifen (NOLVADEX) 10 MG tablet Take 1 tablet (10 mg total) by mouth 2 (two) times daily. 90 tablet 3     Allergies  Allergen Reactions   Nsaids Other (See Comments)    Crohns disease/ IBD   Other     BLOOD PRODUCT REFUSAL    ROS:   All other systems reviewed & are negative except per HPI or as noted below: Constitutional:  No fevers, chills, sweats.  Weight stable Eyes:  No vision changes, No discharge HENT:  No sore throats, nasal drainage Lymph: No neck swelling, No bruising easily Pulmonary:  No cough, productive sputum CV: No orthopnea, PND  Patient walks 30 minutes for about 1 miles without difficulty.  No exertional chest/neck/shoulder/arm pain. GI:  No personal nor family history of GI/colon cancer,irritable bowel syndrome, allergy such as Celiac Sprue, dietary/dairy problems, ulcers nor gastritis.  No recent sick contacts/gastroenteritis.  No travel outside the country.  No changes in diet. Renal: No UTIs, No hematuria Genital:  No drainage, bleeding, masses Musculoskeletal: No severe joint pain.  Good ROM major joints Skin:  No sores or lesions.  No rashes Heme/Lymph:  No easy bleeding.  No swollen lymph nodes Neuro: No focal weakness/numbness.  No seizures Psych: No suicidal ideation.  No hallucinations  BP (!) 113/95     Pulse 100    Temp 98.5 F (36.9 C) (Oral)    Resp 13    Ht 5' 4"  (1.626 m)    Wt 67 kg    SpO2 96%    BMI 25.35 kg/m   Physical Exam:  Constitutional: Not cachectic.  Hygeine adequate.  Vitals signs as above.   Eyes: Pupils reactive, normal extraocular movements. Sclera nonicteric Neuro: CN II-XII intact.  No major focal sensory defects.  No major motor deficits. Lymph: No head/neck/groin lymphadenopathy Psych:  No severe agitation.  No severe anxiety.  Judgment & insight Adequate, Oriented x4, HENT: Normocephalic, Mucus membranes moist.  No thrush.   Neck: Supple, No tracheal deviation.  No obvious thyromegaly Chest: No pain to chest wall compression.  Good respiratory excursion.  No audible wheezing CV:  Pulses intact.   Regular rhythm.  No major extremity edema  Abdomen:   Hernia: Not present. Diastasis recti: Mild supraumbilical midline. Somewhat firm.   Moderately distended.  Nontender.  No hepatomegaly.  No splenomegaly  Gen:  Inguinal hernia: Not present.  Inguinal lymph nodes: without lymphadenopathy.    Rectal: (Deferred)  Ext: No obvious deformity or contracture.  Edema: Not present.  No cyanosis Skin: No major subcutaneous nodules.  Warm and dry Musculoskeletal: Severe joint rigidity not present.  No obvious clubbing.  No digital petechiae.     Results:   Labs: Results for orders placed or performed during the hospital encounter of 05/27/21 (from the past 48 hour(s))  Lipase, blood     Status: None   Collection Time: 05/27/21  1:40 PM  Result Value Ref Range   Lipase 37 11 - 51 U/L    Comment: Performed at [facility], [place] [address], [place], [place] [address]    Imaging / Studies: CT ABDOMEN PELVIS  W CONTRAST  Result Date: 05/27/2021 CLINICAL DATA:  Abdominal pain, acute, nonlocalized EXAM: CT ABDOMEN AND PELVIS WITH CONTRAST TECHNIQUE: Multidetector CT imaging of the abdomen and pelvis was performed using the standard protocol following  bolus administration of intravenous contrast. RADIATION DOSE REDUCTION: This exam was performed according to the departmental dose-optimization program which includes automated exposure control, adjustment of the mA and/or kV according to patient size and/or use of iterative reconstruction technique. CONTRAST:  136m OMNIPAQUE IOHEXOL 300 MG/ML  SOLN COMPARISON:  Abdominal radiograph 01/26/2021, CT 01/26/2021 FINDINGS: Lower chest: Small peripheral opacity in the right lung base, appears to be a subsolid nodule with solid component measuring 3 mm (series 6, image 22). Per Fleischner guidelines, no routine follow-up imaging is recommended. There are bilateral saline breast implants with radial folds. Hepatobiliary: No focal liver abnormality is seen. The gallbladder is unremarkable. Pancreas: Unremarkable. No pancreatic ductal dilatation or surrounding inflammatory changes. Spleen: Normal in size without focal abnormality. Adrenals/Urinary Tract: Adrenal glands are unremarkable. Unchanged 7 mm left adrenal nodule, statistically likely to be an adenoma. Normal right adrenal gland. No hydronephrosis or nephrolithiasis. Stomach/Bowel: Prior subtotal colectomy with ileosigmoid anastomosis. Diffusely dilated small bowel with air-fluid levels, and transition point in the central pelvis (coronal images 44-60), at the ileosigmoid anastomosis. There is twisting and kinking of the anastomosis. Vascular/Lymphatic: There are few prominent mesenteric lymph nodes in the mid abdomen. No significant vascular findings are present. No AAA. Reproductive: Unchanged multiple uterine fibroids, a few of which are calcified. Other: Small volume abdominopelvic ascites.  No free air. Musculoskeletal: No acute or significant osseous findings. IMPRESSION: Small-bowel obstruction with transition point at the ileosigmoid anastomosis. Twisting and kinking at the transition point, likely due to adhesions. Small volume abdominopelvic free fluid. No  free air. Electronically Signed   By: JMaurine SimmeringM.D.   On: 05/27/2021 16:16   [place]  Result Date: 05/25/2021 CLINICAL DATA:  Palpable abnormality. Thyromegaly on physical examination. EXAM: THYROID ULTRASOUND TECHNIQUE: Ultrasound examination of the thyroid gland and adjacent soft tissues was performed. COMPARISON:  None. FINDINGS: Parenchymal Echotexture: Moderately heterogenous Isthmus: Normal in size measures 0.4 cm in diameter Right lobe: Borderline enlarged measuring 4.5 x 2.1 x 2.1 cm Left lobe: Borderline enlarged measuring 4.7 x 2.0 x 2.0 cm _________________________________________________________ Estimated total number of nodules >/= 1 cm: 0 Number of spongiform nodules >/=  2 cm not described below (TR1): 0 Number of mixed cystic and solid nodules >/= 1.5 cm not described below (TR2): 0 _________________________________________________________ There is an approximately 0.9 x 0.9 x 0.6 cm anechoic cyst within the right-sided the thyroid isthmus (labeled 1), which does not meet criteria to recommend percutaneous sampling or continued dedicated follow-up. There is an approximately 0.7 x 0.6 x 0.3 cm spongiform/benign-appearing nodule within mid aspect the right lobe of the thyroid (labeled 2), which does not meet criteria to recommend percutaneous sampling or continued dedicated follow-up There is an approximately 0.7 x 0.6 x 0.5 cm mixed partially cystic, partially solid nodule within the mid, posterior aspect the right lobe of the thyroid (labeled 3), which does not meet criteria to recommend percutaneous sampling or continued dedicated follow-up There is an approximately 0.7 x 0.5 x 0.3 cm spongiform/benign-appearing nodule within the inferior pole of the right lobe of the thyroid (labeled 4), which does not meet criteria to recommend percutaneous sampling or continued dedicated follow-up _________________________________________________________ There is an approximately 0.8 x 0.6 x 0.5 cm  spongiform/benign-appearing nodule within mid aspect the left lobe of the  thyroid (labeled 5), which does not meet imaging criteria to recommend percutaneous sampling or continued dedicated follow-up. IMPRESSION: 1. Borderline enlarged and moderately heterogeneous appearing thyroid without worrisome nodule or mass. 2. None of the discretely measured punctate (sub 0.9 cm) spongiform/benign-appearing nodules or cysts meet imaging criteria to recommend percutaneous sampling or continued dedicated follow-up. The above is in keeping with the ACR TI-RADS recommendations - J Am Coll Radiol 2017;14:587-595. Electronically Signed   By: Sandi Mariscal M.D.   On: 05/25/2021 15:11    Medications / Allergies: per chart  Antibiotics: Anti-infectives (From admission, onward)    None         Note: Portions of this report may have been transcribed using voice recognition software. Every effort was made to ensure accuracy; however, inadvertent computerized transcription errors may be present.   Any transcriptional errors that result from this process are unintentional.    Adin Hector, MD, FACS, MASCRS Esophageal, Gastrointestinal & Colorectal Surgery Robotic and Minimally Invasive Surgery  [facility], [place]  [place]. [address] [place], [place] [address] [phone] Fax [phone] Main  CONTACT INFORMATION:  Weekday (9AM-5PM): Call [facility] main office at [phone]  Weeknight (5PM-9AM) or Weekend/Holiday: Check [email or web address] (password " [email or web address]") for General Surgery [facility] coverage  (Please, do not use SecureChat as it is not reliable communication to operating surgeons for immediate patient care)       05/27/2021  4:48 PM

## 2021-05-27 NOTE — Assessment & Plan Note (Addendum)
This could be reactive.  No signs of infection noted. Leukocytosis resolved.

## 2021-05-27 NOTE — Assessment & Plan Note (Addendum)
Patient presented with cramping abdominal pain associated with nausea, vomiting and hiccups. Hiccups and pain was so intense, sent from cancer center. CT abdomen showed small bowel obstruction. Continue IV hydration, bowel rest, NG decompression. Adequate pain control with Dilaudid. Continue IV Zofran as needed. General surgery consulted.  NG tube discontinued. Patient reports having 5 bowel movements,  feeling much improved. She reports abdominal pain has resolved, started on full liquid diet Advance diet as tolerated.  SBO resolved.  Cleared from general surgery to be discharged

## 2021-05-27 NOTE — Hospital Course (Addendum)
This 46 years old female with PMH significant for Crohn's disease, breast cancer undergoing chemotherapy, significant small and large intestinal disease required abdominal colectomy with ileorectal anastomosis.  She presented in the ED with complaints of nausea, vomiting and abdominal pain that started last night.  Patient reports having similar episode few months back and found to have small bowel obstruction which resolved with conservative management. CT abdomen showed Small-bowel obstruction with transition point at the ileosigmoid anastomosis. Twisting and kinking at the transition point, likely due to adhesions. Small volume abdominopelvic free fluid. No free air. Patient is admitted for small bowel obstruction, general surgery and GI is consulted.  Patient was kept n.p.o., NG tube was inserted.  Patient has significantly improved , She has bowel movements multiple times ,  denies any abdominal pain, started on full liquid diet, advance to soft diet.  Small bowel obstruction has resolved with conservative management.  Patient wants to be discharged.  Patient is being discharged and follow-up with general surgery.

## 2021-05-27 NOTE — Progress Notes (Signed)
[facility] Follow up:    Pcp, No No address on file   DIAGNOSIS:  Cancer Staging  Malignant neoplasm of upper-outer quadrant of right breast in female, estrogen receptor positive ([place]) Staging form: Breast, AJCC 8th Edition - Clinical: Stage IIIB (cT4d, cN1, cM0, G3, ER+, PR-, HER2+) - Signed by [name], MD on 07/07/2020 Histologic grading system: 3 grade system   SUMMARY OF ONCOLOGIC HISTORY: Oncology History  Malignant neoplasm of upper-outer quadrant of right breast in female, estrogen receptor positive ([place])  06/03/2020 Mammogram   06/03/2020, US guided biopsy of the right breast 9 0 clock mass showed grade III IDC; Prognostics showed ER 40% positive, weak staining, PR positive 0 %, Her 2 3 +, Ki 67 30%   06/04/2020 Initial Diagnosis   [id number] 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 palpable right breast mass measuring about 7 cm x 5 and half centimeters, palpable right axillary lymph node with concern for skin invasion    06/08/2020 Cancer Staging   Staging form: Breast, AJCC 8th Edition - Clinical: Stage IIIB (cT4d, cN1, cM0, G3, ER+, PR-, HER2+) - Signed by [name], MD on 07/07/2020 Histologic grading system: 3 grade system    06/24/2020 Genetic Testing   No pathogenic variants detected in [facility] +RNAinsight.  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  The report date is June 24, 2020.   The CustomNext-Cancer+RNAinsight panel offered by [facility] includes sequencing and rearrangement analysis for the following 47 genes:  APC, ATM, AXIN2, BARD1, BMPR1A, BRCA1, BRCA2, BRIP1, CDH1, CDK4, CDKN2A, CHEK2, DICER1, EPCAM, GREM1, HOXB13, MEN1, MLH1, MSH2, MSH3, MSH6, MUTYH, NBN, NF1, NF2, NTHL1, PALB2, PMS2, POLD1, POLE, PTEN, RAD51C, RAD51D, RECQL, RET, SDHA, SDHAF2, SDHB, SDHC, SDHD, SMAD4, SMARCA4, STK11, TP53, TSC1, TSC2, and VHL.  RNA data is routinely analyzed for use in variant  interpretation for all genes.   06/25/2020 - 10/29/2020 Chemotherapy   TCHP x6 cycles   11/09/2020 Surgery   Bilateral mastectomies: Left mastectomy: Benign Right mastectomy: Residual microinvasive cancer status post neoadjuvant therapy, DCIS, 0/12 lymph nodes, ER +1%, PR negative, HER2 equivocal 2+ by [facility]   11/19/2020 -  Chemotherapy   Patient is on Treatment Plan : BREAST Trastuzumab  + Pertuzumab q21d x 13 cycles       CURRENT THERAPY: Herceptin/Perjeta  INTERVAL HISTORY: [name] 46 y.o. female returns for evaluation prior to receiving Herceptin Perjeta.  She is here urgently because overnight she developed intense abdominal pain along with nausea vomiting.  She says that this feels like one of her Crohn's flares, and when this happened she normally takes her Crohn's medication however she cannot tolerate this.  She has not been able to keep anything down.  She feels very poorly.  She was too weak to stand up to give [place] a weight today.  Her white blood cells today were elevated at 12.4.  She also has a low-grade fever of 100.   Patient Active Problem List   Diagnosis Date Noted   Crohn's disease of small and large intestines with complication ([place])    Small bowel obstruction ([place]) 01/26/2021   History of Crohn's disease    Breast cancer ([place]) 11/09/2020   Port-A-Cath in place 07/15/2020   Genetic testing 06/25/2020   Family history of breast cancer 06/11/2020   Malignant neoplasm of upper-outer quadrant of right breast in female, estrogen receptor positive ([place]) 06/04/2020   Abnormal finding present on diagnostic imaging of uterus  05/07/2019   Vitamin D deficiency 09/24/2013   Crohn's colitis, with intestinal obstruction ([place]) 09/17/2013   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 10/17/2010    is allergic to other.  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   Crohn's  disease ([place])    Family history of breast cancer 06/11/2020   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 10/17/2010   Rectal bleeding    Uterine fibroid    Vitamin D deficiency 09/24/2013    SURGICAL HISTORY: Past Surgical History:  Procedure Laterality Date   AXILLARY LYMPH NODE DISSECTION Right 11/09/2020   Procedure: RIGHT AXILLARY LYMPH NODE DISSECTION;  Surgeon: [name], MD;  Location: [place];  Service: General;  Laterality: Right;   BIOPSY  07/01/2020   Procedure: BIOPSY;  Surgeon: Yetta Flock, MD;  [facility];  Service: Gastroenterology;;   BIOPSY  01/27/2021   Procedure: BIOPSY;  Surgeon: Yetta Flock, MD;  [facility];  Service: Gastroenterology;;   BREAST RECONSTRUCTION WITH PLACEMENT OF TISSUE EXPANDER AND FLEX HD (ACELLULAR HYDRATED DERMIS) Bilateral 11/09/2020   Procedure: BILATERAL BREAST RECONSTRUCTION WITH PLACEMENT OF TISSUE EXPANDER AND FLEX HD (ACELLULAR HYDRATED DERMIS);  Surgeon: [name], MD;  Location: [place];  Service: Plastics;  Laterality: Bilateral;   COLECTOMY  06/27/2019   COLONOSCOPY  2015   FLEXIBLE SIGMOIDOSCOPY N/A 07/01/2020   Procedure: FLEXIBLE SIGMOIDOSCOPY;  Surgeon: Yetta Flock, MD;  [facility];  Service: Gastroenterology;  Laterality: N/A;   FLEXIBLE SIGMOIDOSCOPY N/A 01/27/2021   Procedure: FLEXIBLE SIGMOIDOSCOPY;  Surgeon: Yetta Flock, MD;  [facility];  Service: Gastroenterology;  Laterality: N/A;   NIPPLE SPARING MASTECTOMY Bilateral 11/09/2020   Procedure: BILATERAL NIPPLE SPARING MASTECTOMY;  Surgeon: [name], MD;  Location: [place];  Service: General;  Laterality: Bilateral;   PORTACATH PLACEMENT N/A 06/23/2020   Procedure: INSERTION PORT-A-CATH;  Surgeon: [name], MD;  Location: [place];  Service: General;  Laterality: N/A;   START TIME OF 11:00 AM FOR 60 MINUTES [place] [facility]    SOCIAL HISTORY: Social History   Socioeconomic History   Marital status: Significant Other    Spouse name: Not on file   Number of children: 1   Years of education: 13   Highest education level: Not on file  Occupational History   Occupation: Medical Coder   Tobacco Use   Smoking status: Former    Types: Cigarettes   Smokeless tobacco: Never  Vaping Use   Vaping Use: Never used  Substance and Sexual Activity   Alcohol use: Yes    Alcohol/week: 1.0 - 2.0 standard drink    Types: 1 - 2 Glasses of wine per week   Drug use: No   Sexual activity: Yes    Partners: Male    Comment: Intermittent protection not consistent  Other Topics Concern   Not on file  Social History Narrative   Single, 1 daughter born 2001 approximately   Medical coder for [employer]   She is a former smoker, occasional alcohol no drug use   Social Determinants of Radio broadcast assistant Strain: Not on file  Food Insecurity: Not on file  Transportation Needs: Not on file  Physical Activity: Not on file  Stress: Not on file  Social Connections: Not on file  Intimate Partner Violence: Not  At Risk   Fear of Current or Ex-Partner: No   Emotionally Abused: No   Physically Abused: No   Sexually Abused: No    FAMILY HISTORY: Family History  Problem Relation Age of Onset   Breast cancer Maternal Aunt        dx unknown age   Diabetes Father    Diabetes Paternal Aunt    Breast cancer Cousin        maternal cousin; dx unknown age   Esophageal cancer Neg Hx    Rectal cancer Neg Hx    Stomach cancer Neg Hx    Colon polyps Neg Hx    Colon cancer Neg Hx     Review of Systems  Constitutional:  Positive for appetite change, chills, fatigue and fever.  Eyes:  Negative for eye problems and icterus.  Respiratory:  Negative for chest tightness and cough.   Cardiovascular:  Negative for chest pain, leg swelling and palpitations.  Gastrointestinal:   Positive for abdominal distention and abdominal pain.  Endocrine: Negative for hot flashes.  Hematological:  Negative for adenopathy. Does not bruise/bleed easily.  Psychiatric/Behavioral:  The patient is not nervous/anxious.      PHYSICAL EXAMINATION  ECOG PERFORMANCE STATUS: 3 - Symptomatic, >50% confined to bed  Vitals:   05/27/21 1357 05/27/21 1438  BP: (!) 136/93   Pulse: (!) 115   Resp: (!) 196   Temp: 99.1 F (37.3 C) 100 F (37.8 C)  SpO2: 98%     Physical Exam Constitutional:      Appearance: She is ill-appearing.  HENT:     Head: Normocephalic and atraumatic.  Cardiovascular:     Rate and Rhythm: Tachycardia present.     Heart sounds: Normal heart sounds.  Pulmonary:     Effort: Pulmonary effort is normal.     Breath sounds: Normal breath sounds.  Abdominal:     Tenderness: There is guarding.  Neurological:     General: No focal deficit present.     Mental Status: She is alert and oriented to person, place, and time.  Psychiatric:        Mood and Affect: Mood normal.        Behavior: Behavior normal.    LABORATORY DATA:  CBC    Component Value Date/Time   WBC 14.0 (H) 05/27/2021 1340   RBC 5.09 05/27/2021 1340   HGB 13.7 05/27/2021 1340   HGB 7.4 (L) 11/19/2020 0854   HGB 10.4 (L) 01/21/2020 1056   HCT 41.2 05/27/2021 1340   HCT 33.9 (L) 01/21/2020 1056   PLT 321 05/27/2021 1340   PLT 341 11/19/2020 0854   PLT 346 01/21/2020 1056   MCV 80.9 05/27/2021 1340   MCV 71 (L) 01/21/2020 1056   MCH 26.9 05/27/2021 1340   MCHC 33.3 05/27/2021 1340   RDW 13.9 05/27/2021 1340   RDW 18.8 (H) 01/21/2020 1056   LYMPHSABS 0.4 (L) 05/27/2021 1340   LYMPHSABS 1.3 01/21/2020 1056   MONOABS 0.7 05/27/2021 1340   EOSABS 0.0 05/27/2021 1340   EOSABS 0.0 01/21/2020 1056   BASOSABS 0.0 05/27/2021 1340   BASOSABS 0.0 01/21/2020 1056    CMP     Component Value Date/Time   NA 136 05/27/2021 1340   NA 135 01/21/2020 1056   K 3.7 05/27/2021 1340   CL 99  05/27/2021 1340   CO2 26 05/27/2021 1340   GLUCOSE 150 (H) 05/27/2021 1340   BUN 15 05/27/2021 1340   BUN 12 01/21/2020 1056  CREATININE 0.85 05/27/2021 1340   CREATININE 0.79 01/21/2021 0817   CALCIUM 10.3 05/27/2021 1340   PROT 8.6 (H) 05/27/2021 1340   ALBUMIN 4.9 05/27/2021 1340   AST 21 05/27/2021 1340   AST 17 01/21/2021 0817   ALT 15 05/27/2021 1340   ALT 13 01/21/2021 0817   ALKPHOS 134 (H) 05/27/2021 1340   BILITOT 0.9 05/27/2021 1340   BILITOT 0.5 01/21/2021 0817   GFRNONAA >60 05/27/2021 1340   GFRNONAA >60 01/21/2021 0817   GFRAA 108 01/21/2020 1056     ASSESSMENT and THERAPY PLAN:   Malignant neoplasm of upper-outer quadrant of right breast in female, estrogen receptor positive ([place]) 06/03/2020, US guided biopsy of the right breast 9 0 clock mass showed grade III IDC; Prognostics showed ER 40% positive, weak staining, PR positive 0 %, Her 2 3 +, Ki 67 30% 4dCN1M0 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 palpable right breast mass measuring about 7 cm x 5 and half centimeters, palpable right axillary lymph node with concern for skin invasion    Treatment Plan: 1. Neoadjuvant chemo with TCHP q 3 weeks X 6 followed by [facility] 2. Bilateral mastectomies: Left mastectomy: Benign Right mastectomy: Residual microinvasive cancer status post neoadjuvant therapy, DCIS, 0/12 lymph nodes, ER +1%, PR negative, HER2 equivocal 2+ by IHC 3. Adj XRT completed 02/23/2021 4. Adj Anti-estrogen therapy with tamoxifen started 04/24/2021 5. Neratinib --------------------------------------------------------------------------------------------------------- Current treatment: 1.  Herceptin Perjeta maintenance 2. adjuvant tamoxifen started 04/24/2021    The patient is here today for evaluation of abdominal pain nausea vomiting.  White blood cells are slightly elevated with a left shift.  She is tachycardic and has low-grade fever.  Considering her abdominal pain and history of intestinal  obstruction I am concerned that we may be dealing with another obstruction or acute abdomen.  I am sending her over to the ER for IV fluids and further prompt evaluation.  I reviewed with her that considering her history of previous obstruction her work-up and evaluation should be done promptly and safely in the emergency room.  She verbalized understanding and my nurse called over to the ER and took her over.   All questions were answered. The patient knows to call the clinic with any problems, questions or concerns. We can certainly see the patient much sooner if necessary.  Total encounter time: 30 minutes in face-to-face visit time, chart review, lab review, order entry, care coordination, and documentation of the encounter.  Wilber Bihari, NP 05/27/21 2:53 PM Medical Oncology and Hematology [facility] [place], [place] [address] Tel. [phone]    Fax. [phone]  *Total Encounter Time as defined by the Centers for Medicare and Medicaid Services includes, in addition to the face-to-face time of a patient visit (documented in the note above) non-face-to-face time: obtaining and reviewing outside history, ordering and reviewing medications, tests or procedures, care coordination (communications with other health care professionals or caregivers) and documentation in the medical record.

## 2021-05-27 NOTE — H&P (Signed)
History and Physical    Patient: [name] WER:[id number] DOB: [birth year] DOA: 05/27/2021 DOS: the patient was seen and examined on 05/27/2021 PCP: Pcp, No  Patient coming from: Home  Chief Complaint:  Chief Complaint  Patient presents with   Hiccups   Nausea    HPI: [name] is a 46 y.o. female with PMH significant for Crohn's disease, breast cancer undergoing chemotherapy, significant small and large intestinal disease required abdominal colectomy with ileorectal anastomosis.  She presented in the ED with complaints of nausea, vomiting and abdominal pain that started last night.  She describes abdominal pain as generalized, sharp, non, radiating associated with nausea,  vomiting and hiccups..  She described pain as 6 /10 on the pain scale, patient reports having similar episode few months back and found to have small bowel obstruction which resolved with conservative management. CT abdomen showed Small-bowel obstruction with transition point at the ileosigmoid anastomosis. Twisting and kinking at the transition point, likely due to adhesions. Small volume abdominopelvic free fluid. No free air. ED course: Temp 98.5, HR 109, RR 26, BP 147/110, SPO2 98% on room air Labs include sodium 136, potassium 3.7, chloride 99, bicarb 26, glucose 150, BUN 15, creatinine 0.85, calcium 10.3, anion gap 11, magnesium 1.9, alkaline phosphatase 134, albumin 4.9, lipase 37, AST 21, ALT 15, total protein 8.6, total bilirubin 0.9, WBC 14.0, hemoglobin 13.7, hematocrit 41.2, MCV 80.9, platelets 321. CT abdomen: Small-bowel obstruction with transition point at the ileosigmoid anastomosis. Twisting and kinking at the transition point, likely due to adhesions. Small volume abdominopelvic free fluid. No free air.   Review of Systems:  Review of Systems  Constitutional:  Positive for malaise/fatigue.  HENT: Negative.    Eyes: Negative.   Respiratory: Negative.    Cardiovascular: Negative.    Gastrointestinal:  Positive for abdominal pain, nausea and vomiting.  Genitourinary: Negative.        Hiccups  Musculoskeletal: Negative.   Skin: Negative.   Neurological: Negative.   Endo/Heme/Allergies: Negative.   Psychiatric/Behavioral: Negative.       Past Medical History:  Diagnosis Date   Cancer 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 Marcello Moores     Crohn's disease [facility])    Family history of breast cancer 06/11/2020   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 10/17/2010   Rectal bleeding    Uterine fibroid    Vitamin D deficiency 09/24/2013   Past Surgical History:  Procedure Laterality Date   AXILLARY LYMPH NODE DISSECTION Right 11/09/2020   Procedure: RIGHT AXILLARY LYMPH NODE DISSECTION;  Surgeon: [name], MD;  Location: [place];  Service: General;  Laterality: Right;   BIOPSY  07/01/2020   Procedure: BIOPSY;  Surgeon: Yetta Flock, MD;  [facility];  Service: Gastroenterology;;   BIOPSY  01/27/2021   Procedure: BIOPSY;  Surgeon: Yetta Flock, MD;  [facility];  Service: Gastroenterology;;   BREAST RECONSTRUCTION WITH PLACEMENT OF TISSUE EXPANDER AND FLEX HD (ACELLULAR HYDRATED DERMIS) Bilateral 11/09/2020   Procedure: BILATERAL BREAST RECONSTRUCTION WITH PLACEMENT OF TISSUE EXPANDER AND FLEX HD (ACELLULAR HYDRATED DERMIS);  Surgeon: [name], MD;  Location: [place];  Service: Plastics;  Laterality: Bilateral;   COLECTOMY  06/27/2019   COLONOSCOPY  2015   FLEXIBLE SIGMOIDOSCOPY N/A 07/01/2020   Procedure: FLEXIBLE SIGMOIDOSCOPY;  Surgeon: Yetta Flock, MD;  [facility];  Service: Gastroenterology;   Laterality: N/A;   FLEXIBLE SIGMOIDOSCOPY N/A 01/27/2021   Procedure: FLEXIBLE SIGMOIDOSCOPY;  Surgeon: Yetta Flock, MD;  [facility];  Service: Gastroenterology;  Laterality: N/A;   NIPPLE SPARING MASTECTOMY Bilateral 11/09/2020   Procedure: BILATERAL NIPPLE SPARING MASTECTOMY;  Surgeon: [name], MD;  Location: [place];  Service: General;  Laterality: Bilateral;   PORTACATH PLACEMENT N/A 06/23/2020   Procedure: INSERTION PORT-A-CATH;  Surgeon: [name], MD;  Location: [place];  Service: General;  Laterality: N/A;  START TIME OF 11:00 AM FOR 60 MINUTES [place] [facility]   Social History:  reports that she has quit smoking. Her smoking use included cigarettes. She has never used smokeless tobacco. She reports current alcohol use of about 1.0 - 2.0 standard drink per week. She reports that she does not use drugs.  Allergies  Allergen Reactions   Nsaids Other (See Comments)    Crohns disease/ IBD   Other     BLOOD PRODUCT REFUSAL    Family History  Problem Relation Age of Onset   Breast cancer Maternal Aunt        dx unknown age   Diabetes Father    Diabetes Paternal Aunt    Breast cancer Cousin        maternal cousin; dx unknown age   Esophageal cancer Neg Hx    Rectal cancer Neg Hx    Stomach cancer Neg Hx    Colon polyps Neg Hx    Colon cancer Neg Hx     Prior to Admission medications   Medication Sig Start Date End Date Taking? Authorizing Provider  acetaminophen (TYLENOL) 500 MG tablet Take 500-1,000 mg by mouth every 6 (six) hours as needed for mild pain.    [provider]  lidocaine-prilocaine (EMLA) cream Apply 1 application topically as needed (Prior to port access). 06/16/20   Benay Pike, MD  mesalamine (LIALDA) 1.2 g EC tablet Take 2 tablets (2.4 g total) by mouth 2 (two) times daily with a meal. 07/22/20   [name], MD  polyethylene glycol (MIRALAX) 17 g packet Take 17 g by mouth  daily as needed. 01/28/21   Maczis, Barth Kirks, PA-C  tamoxifen (NOLVADEX) 10 MG tablet Take 1 tablet (10 mg total) by mouth 2 (two) times daily. 03/30/21   [name], MD  prochlorperazine (COMPAZINE) 10 MG tablet Take 1 tablet (10 mg total) by mouth every 6 (six) hours as needed (Nausea or vomiting). 06/16/20 11/19/20  Benay Pike, MD    Physical Exam: Vitals:   05/27/21 1530 05/27/21 1606 05/27/21 1641 05/27/21 1645  BP: (!) 129/98 119/83 (!) 113/95 (!) 119/91  Pulse: (!) 112 (!) 103 100 (!) 105  Resp: 20 16 13 17   Temp:      TempSrc:      SpO2: 96% 95% 96% 96%  Weight:      Height:       Physical Exam Vitals and nursing note reviewed.  Constitutional:      Appearance: Normal appearance. She is normal weight.  HENT:     Head: Normocephalic and atraumatic.  Eyes:     Conjunctiva/sclera: Conjunctivae normal.  Cardiovascular:     Rate and Rhythm: Normal rate and regular rhythm.     Pulses: Normal pulses.     Heart sounds: Normal heart sounds.  Pulmonary:  Effort: Pulmonary effort is normal.     Breath sounds: Normal breath sounds.  Abdominal:     General: Abdomen is flat. Bowel sounds are normal.     Palpations: Abdomen is soft.     Tenderness: There is abdominal tenderness.  Skin:    General: Skin is warm and dry.  Neurological:     General: No focal deficit present.     Mental Status: She is alert and oriented to person, place, and time. Mental status is at baseline.  Psychiatric:        Mood and Affect: Mood normal.        Behavior: Behavior normal.     Data Reviewed: I have Reviewed nursing notes, Vitals, and Lab results since pt's last encounter. Pertinent lab results CBC, CMP I have ordered test including CBC, CMP, mag , phosphorus I have independently visualized and interpreted imaging CT abdomen which showed small bowel obstruction. I have ordered imaging studies x-ray abdomen. I have reviewed the last note from general surgery,  I have discussed pt's  care plan and test results with patient and the daughter.   Assessment and Plan: * SBO (small bowel obstruction) ([place]) Patient presented with cramping abdominal pain associated with nausea, vomiting and hiccups that started last night. Hiccups and pain is so intense, sent from cancer center. CT abdomen showed small bowel obstruction. Admit on medical floor, continue IV hydration, bowel rest. Insert NG tube, adequate pain control with Dilaudid. Continue IV Zofran as needed. General surgery and GI consulted.  Leucocytosis This could be reactive.  No signs of infection noted.  Crohn's disease of small and large intestines with complication ([place])- (present on admission) Patient reports having Crohn's disease and been on mesalamine. She reports having significant small and large intestinal disease underwent colectomy with ileorectal anastomosis. General surgery consulted, did not seem like Crohn's flare.  Continue SBO protocol.  Malignant neoplasm of upper-outer quadrant of right breast in female, estrogen receptor positive ([place]) Patient is regularly following up with Dr. Payton Mccallum and getting chemotherapy and immunotherapy via Port-A-Cath. Continue outpatient follow-up.  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- (present on admission) H&H remains stable.    Advance Care Planning:   Code Status: Full Code   Consults: General surgery, GI  Family Communication: Spoke with daughter  Severity of Illness: The appropriate patient status for this patient is OBSERVATION. Observation status is judged to be reasonable and necessary in order to provide the required intensity of service to ensure the patient's safety. The patient's presenting symptoms, physical exam findings, and initial radiographic and laboratory data in the context of their medical condition is felt to place them at decreased risk for further clinical deterioration. Furthermore, it is anticipated  that the patient will be medically stable for discharge from the hospital within 2 midnights of admission.   Author: Shawna Clamp, MD 05/27/2021 5:31 PM  For on call review [email or web address].

## 2021-05-27 NOTE — Assessment & Plan Note (Signed)
Patient is regularly following up with Dr. Payton Mccallum and getting chemotherapy and immunotherapy via Port-A-Cath. Continue outpatient follow-up.

## 2021-05-27 NOTE — ED Notes (Signed)
Pt hooked back to low intermittent suction per MD order.

## 2021-05-27 NOTE — ED Notes (Addendum)
NG tube withdrawn some due to x-ray reading. Gastrografin given per NG tube. Tube clamped now and to be unclamped at 7:45 pm.

## 2021-05-27 NOTE — Assessment & Plan Note (Addendum)
Patient reports having Crohn's disease and been on mesalamine. She reports having significant small and large intestinal disease,  underwent colectomy with ileorectal anastomosis. General surgery consulted, did not seem like Crohn's flare.   SBO resolved

## 2021-05-27 NOTE — ED Notes (Signed)
Dr Gross at bedside

## 2021-05-27 NOTE — ED Triage Notes (Signed)
Patient brought over from the cancer center, they brought her over due to nausea, hiccups and tachycardia of 103. Being treated for breast cancer.

## 2021-05-27 NOTE — Assessment & Plan Note (Signed)
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 palpable right breast mass measuring about 7 cm x 5 and half centimeters, palpable right axillary lymph node with concern for skin invasion  Treatment Plan: 1. Neoadjuvant chemo with TCHP q 3 weeks X 6 followed by HP 2. Bilateral mastectomies: Left mastectomy: Benign Right mastectomy: Residual microinvasive cancer status post neoadjuvant therapy, DCIS, 0/12 lymph nodes, ER +1%, PR negative, HER2 equivocal 2+ by IHC 3. Adj XRT completed 02/23/2021 4. Adj Anti-estrogen therapy with tamoxifen started 04/24/2021 5. Neratinib --------------------------------------------------------------------------------------------------------- Current treatment: 1. Herceptin Perjeta maintenance 2. adjuvant tamoxifen started 04/24/2021   The patient is here today for evaluation of abdominal pain nausea vomiting.  White blood cells are slightly elevated with a left shift.  She is tachycardic and has low-grade fever.  Considering her abdominal pain and history of intestinal obstruction I am concerned that we may be dealing with another obstruction or acute abdomen.  I am sending her over to the ER for IV fluids and further prompt evaluation.  I reviewed with her that considering her history of previous obstruction her work-up and evaluation should be done promptly and safely in the emergency room.  She verbalized understanding and my nurse called over to the ER and took her over.

## 2021-05-27 NOTE — Assessment & Plan Note (Signed)
H&H remains stable.

## 2021-05-28 ENCOUNTER — Ambulatory Visit: Payer: Managed Care, Other (non HMO)

## 2021-05-28 ENCOUNTER — Inpatient Hospital Stay (HOSPITAL_COMMUNITY): Payer: Managed Care, Other (non HMO)

## 2021-05-28 ENCOUNTER — Encounter (HOSPITAL_COMMUNITY): Payer: Self-pay | Admitting: Family Medicine

## 2021-05-28 LAB — CBC
HCT: 34.6 % — ABNORMAL LOW (ref 36.0–46.0)
Hemoglobin: 11 g/dL — ABNORMAL LOW (ref 12.0–15.0)
MCH: 26.8 pg (ref 26.0–34.0)
MCHC: 31.8 g/dL (ref 30.0–36.0)
MCV: 84.4 fL (ref 80.0–100.0)
Platelets: 235 10*3/uL (ref 150–400)
RBC: 4.1 MIL/uL (ref 3.87–5.11)
RDW: 14.2 % (ref 11.5–15.5)
WBC: 4.8 10*3/uL (ref 4.0–10.5)
nRBC: 0 % (ref 0.0–0.2)

## 2021-05-28 LAB — BASIC METABOLIC PANEL
Anion gap: 8 (ref 5–15)
BUN: 18 mg/dL (ref 6–20)
CO2: 28 mmol/L (ref 22–32)
Calcium: 8.6 mg/dL — ABNORMAL LOW (ref 8.9–10.3)
Chloride: 101 mmol/L (ref 98–111)
Creatinine, Ser: 0.98 mg/dL (ref 0.44–1.00)
GFR, Estimated: >60 mL/min (ref >60)
Glucose, Bld: 105 mg/dL — ABNORMAL HIGH (ref 70–99)
Potassium: 3.4 mmol/L — ABNORMAL LOW (ref 3.5–5.1)
Sodium: 137 mmol/L (ref 135–145)

## 2021-05-28 LAB — MAGNESIUM: Magnesium: 1.9 mg/dL (ref 1.7–2.4)

## 2021-05-28 LAB — PHOSPHORUS: Phosphorus: 4.1 mg/dL (ref 2.5–4.6)

## 2021-05-28 MED ORDER — CHLORHEXIDINE GLUCONATE CLOTH 2 % EX PADS
6.0000 | MEDICATED_PAD | Freq: Every day | CUTANEOUS | Status: DC
  Administered 2021-05-28 – 2021-05-30 (×3): 6 via TOPICAL

## 2021-05-28 MED ORDER — SODIUM CHLORIDE 0.9 % IV SOLN: INTRAVENOUS | Status: DC | PRN

## 2021-05-28 MED ORDER — POTASSIUM CHLORIDE 10 MEQ/100ML IV SOLN
10.0000 meq | INTRAVENOUS | Status: AC
  Administered 2021-05-28 (×2): 10 meq via INTRAVENOUS
  Filled 2021-05-28 (×2): qty 100

## 2021-05-28 MED ORDER — ENOXAPARIN SODIUM 40 MG/0.4ML IJ SOSY
40.0000 mg | PREFILLED_SYRINGE | INTRAMUSCULAR | Status: DC
  Filled 2021-05-28 (×2): qty 0.4

## 2021-05-28 MED ORDER — SODIUM CHLORIDE 0.9% FLUSH
10.0000 mL | INTRAVENOUS | Status: DC | PRN
  Administered 2021-05-30: 10 mL

## 2021-05-28 MED ORDER — SODIUM CHLORIDE 0.9% FLUSH
10.0000 mL | Freq: Two times a day (BID) | INTRAVENOUS | Status: DC
  Administered 2021-05-30: 10 mL

## 2021-05-28 NOTE — Progress Notes (Signed)
Progress Note   Patient: [name] LTJ:[id number] DOB: 12/19/1975 DOA: 05/27/2021     1 DOS: the patient was seen and examined on 05/28/2021   Brief hospital course: This 46 years old female with PMH significant for Crohn's disease, breast cancer undergoing chemotherapy, significant small and large intestinal disease required abdominal colectomy with ileorectal anastomosis.  She presented in the ED with complaints of nausea, vomiting and abdominal pain that started last night.  Patient reports having similar episode few months back and found to have small bowel obstruction which resolved with conservative management. CT abdomen showed Small-bowel obstruction with transition point at the ileosigmoid anastomosis. Twisting and kinking at the transition point, likely due to adhesions. Small volume abdominopelvic free fluid. No free air. Patient is admitted for small bowel obstruction, general surgery and GI is consulted.  Patient is kept NPO , NG tube decompression and IV hydration.    Assessment and Plan: * SBO (small bowel obstruction) ([place]) Patient presented with cramping abdominal pain associated with nausea, vomiting and hiccups that started last night. Hiccups and pain was so intense, sent from cancer center. CT abdomen showed small bowel obstruction. Continue IV hydration, bowel rest, NG decompression. Adequate pain control with Dilaudid. Continue IV Zofran as needed. General surgery consulted.  Continue above management. Patient reports having 2 bowel movements,  feeling much improved.  Leucocytosis This could be reactive.  No signs of infection noted.  Crohn's disease of small and large intestines with complication ([place])- (present on admission) Patient reports having Crohn's disease and been on mesalamine. She reports having significant small and large intestinal disease underwent colectomy with ileorectal anastomosis. General surgery consulted, did not seem like Crohn's flare.   Continue SBO protocol.  Malignant neoplasm of upper-outer quadrant of right breast in female, estrogen receptor positive ([place]) Patient is regularly following up with Dr. Payton Mccallum and getting chemotherapy and immunotherapy via Port-A-Cath. Continue outpatient follow-up.  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- (present on admission) H&H remains stable.   Subjective: Patient was seen and examined at bedside, overnight events noted. Patient reports having 2 bowel movements.  Denies any nausea, vomiting.  Physical Exam: Vitals:   05/27/21 2337 05/28/21 0247 05/28/21 0614 05/28/21 1038  BP: (!) 155/102 135/89 121/80 125/90  Pulse: (!) 110 (!) 109 (!) 110 (!) 104  Resp: 16 18 18 15   Temp: 99.1 F (37.3 C) 98.3 F (36.8 C) 98.6 F (37 C) 98.4 F (36.9 C)  TempSrc: Oral Oral Oral Oral  SpO2: 100% 99% 98% 99%  Weight:      Height:       Physical Exam Vitals and nursing note reviewed.  Constitutional:      Appearance: Normal appearance. She is normal weight.  HENT:     Head: Normocephalic and atraumatic.  Eyes:     Conjunctiva/sclera: Conjunctivae normal.  Cardiovascular:     Rate and Rhythm: Normal rate and regular rhythm.     Pulses: Normal pulses.     Heart sounds: Normal heart sounds.  Pulmonary:     Effort: Pulmonary effort is normal.     Breath sounds: Normal breath sounds.  Abdominal:     General: Abdomen is flat. Bowel sounds are normal.     Palpations: Abdomen is soft.  Skin:    General: Skin is warm and dry.  Neurological:     General: No focal deficit present.     Mental Status: She is alert and oriented to person, place, and  time. Mental status is at baseline.  Psychiatric:        Mood and Affect: Mood normal.        Behavior: Behavior normal.     Data Reviewed: I have Reviewed nursing notes, Vitals, and Lab results since pt's last encounter. Pertinent lab results CBC CMP I have ordered test including CBC CMP I have  independently visualized and interpreted imaging CT abdomen which showed SBO with transition point. I have ordered imaging studies x-ray abdomen. I have reviewed the last note from general surgery,  I have discussed pt's care plan and test results with patient.   Family Communication: No family at bedside  Disposition: Status is: Inpatient Remains inpatient appropriate because: Admitted for SBO, requiring IV hydration, bowel rest and pain control.    Planned Discharge Destination: Home  Time spent: 35 minutes  Author: Shawna Clamp, MD 05/28/2021 11:28 AM  For on call review [email or web address].

## 2021-05-28 NOTE — Progress Notes (Signed)
NG tube advanced 5cm.

## 2021-05-28 NOTE — Progress Notes (Signed)
Chief Complaint/Subjective: +BM, no nausea, abdomen improving  Objective: Vital signs in last 24 hours: Temp:  [98.3 F (36.8 C)-100 F (37.8 C)] 98.6 F (37 C) (02/18 0614) Pulse Rate:  [96-115] 110 (02/18 0614) Resp:  [13-196] 18 (02/18 0614) BP: (113-159)/(80-112) 121/80 (02/18 0614) SpO2:  [95 %-100 %] 98 % (02/18 0614) Weight:  [67 kg] 67 kg (02/17 1503) Last BM Date : 05/26/21 Intake/Output from previous day: 02/17 0701 - 02/18 0700 In: 1628.8 [I.V.:628.8; IV Piggyback:1000] Out: 200 [Emesis/NG output:200] Intake/Output this shift: No intake/output data recorded.  PE: Gen: NAd Resp: nonlabored Card: tachycardic Abd: soft, NT, ND  Lab Results:  Recent Labs    05/27/21 1340 05/28/21 0429  WBC 14.0* 4.8  HGB 13.7 11.0*  HCT 41.2 34.6*  PLT 321 235   BMET Recent Labs    05/27/21 1340 05/28/21 0429  NA 136 137  K 3.7 3.4*  CL 99 101  CO2 26 28  GLUCOSE 150* 105*  BUN 15 18  CREATININE 0.85 0.98  CALCIUM 10.3 8.6*   PT/INR No results for input(s): LABPROT, INR in the last 72 hours. CMP     Component Value Date/Time   NA 137 05/28/2021 0429   NA 135 01/21/2020 1056   K 3.4 (L) 05/28/2021 0429   CL 101 05/28/2021 0429   CO2 28 05/28/2021 0429   GLUCOSE 105 (H) 05/28/2021 0429   BUN 18 05/28/2021 0429   BUN 12 01/21/2020 1056   CREATININE 0.98 05/28/2021 0429   CREATININE 0.79 01/21/2021 0817   CALCIUM 8.6 (L) 05/28/2021 0429   PROT 8.6 (H) 05/27/2021 1340   ALBUMIN 4.9 05/27/2021 1340   AST 21 05/27/2021 1340   AST 17 01/21/2021 0817   ALT 15 05/27/2021 1340   ALT 13 01/21/2021 0817   ALKPHOS 134 (H) 05/27/2021 1340   BILITOT 0.9 05/27/2021 1340   BILITOT 0.5 01/21/2021 0817   GFRNONAA >60 05/28/2021 0429   GFRNONAA >60 01/21/2021 0817   GFRAA 108 01/21/2020 1056   Lipase     Component Value Date/Time   LIPASE 37 05/27/2021 1340    Studies/Results: DG Abd 1 View  Result Date: 05/28/2021 CLINICAL DATA:  NG tube placement.  EXAM: ABDOMEN - 1 VIEW COMPARISON:  05/27/2021. FINDINGS: Multiple loops of dilated small bowel with air-fluid levels are noted in the abdomen measuring up to 4.7 cm in diameter. An enteric tube side port is seen at the gastroesophageal junction and should be advanced approximately 5 cm. No radio-opaque calculi. Left chest port is stable. Surgical clips are present in the right axilla. IMPRESSION: 1. Multiple distended loops of small bowel in the upper abdomen with air-fluid levels, compatible with small bowel obstruction. 2. The side port of the enteric tube is at the gastroesophageal junction and should be advanced approximately 5 cm. Electronically Signed   By: Brett Fairy M.D.   On: 05/28/2021 00:39   CT ABDOMEN PELVIS W CONTRAST  Result Date: 05/27/2021 CLINICAL DATA:  Abdominal pain, acute, nonlocalized EXAM: CT ABDOMEN AND PELVIS WITH CONTRAST TECHNIQUE: Multidetector CT imaging of the abdomen and pelvis was performed using the standard protocol following bolus administration of intravenous contrast. RADIATION DOSE REDUCTION: This exam was performed according to the departmental dose-optimization program which includes automated exposure control, adjustment of the mA and/or kV according to patient size and/or use of iterative reconstruction technique. CONTRAST:  151m OMNIPAQUE IOHEXOL 300 MG/ML  SOLN COMPARISON:  Abdominal radiograph 01/26/2021, CT 01/26/2021 FINDINGS: Lower  chest: Small peripheral opacity in the right lung base, appears to be a subsolid nodule with solid component measuring 3 mm (series 6, image 22). Per Fleischner guidelines, no routine follow-up imaging is recommended. There are bilateral saline breast implants with radial folds. Hepatobiliary: No focal liver abnormality is seen. The gallbladder is unremarkable. Pancreas: Unremarkable. No pancreatic ductal dilatation or surrounding inflammatory changes. Spleen: Normal in size without focal abnormality. Adrenals/Urinary Tract:  Adrenal glands are unremarkable. Unchanged 7 mm left adrenal nodule, statistically likely to be an adenoma. Normal right adrenal gland. No hydronephrosis or nephrolithiasis. Stomach/Bowel: Prior subtotal colectomy with ileosigmoid anastomosis. Diffusely dilated small bowel with air-fluid levels, and transition point in the central pelvis (coronal images 44-60), at the ileosigmoid anastomosis. There is twisting and kinking of the anastomosis. Vascular/Lymphatic: There are few prominent mesenteric lymph nodes in the mid abdomen. No significant vascular findings are present. No AAA. Reproductive: Unchanged multiple uterine fibroids, a few of which are calcified. Other: Small volume abdominopelvic ascites.  No free air. Musculoskeletal: No acute or significant osseous findings. IMPRESSION: Small-bowel obstruction with transition point at the ileosigmoid anastomosis. Twisting and kinking at the transition point, likely due to adhesions. Small volume abdominopelvic free fluid. No free air. Electronically Signed   By: Maurine Simmering M.D.   On: 05/27/2021 16:16   DG Abd Portable 1V-Small Bowel Obstruction Protocol-initial, 8 hr delay  Result Date: 05/28/2021 CLINICAL DATA:  Small-bowel obstruction, 8 hour delay. EXAM: PORTABLE ABDOMEN - 1 VIEW COMPARISON:  05/28/2021. FINDINGS: There is redemonstration of multiple dilated loops of small bowel in the abdomen measuring up to 4.7 cm. Small amount of residual contrast is present in the left upper quadrant. An enteric tube is noted in the stomach. A calcified lesion is noted in the pelvis on the left, compatible with known fibroid. Excreted contrast is present in the urinary bladder. IMPRESSION: 1. Multiple distended loops of small bowel in the abdomen, compatible with known small bowel obstruction and not significantly changed from the prior exam. 2. Enteric tube terminates in the stomach. Electronically Signed   By: Brett Fairy M.D.   On: 05/28/2021 03:09   DG Abd  Portable 1V-Small Bowel Protocol-Position Verification  Result Date: 05/27/2021 CLINICAL DATA:  Enteric catheter placement, breast cancer EXAM: PORTABLE ABDOMEN - 1 VIEW COMPARISON:  01/26/2021 FINDINGS: Upright frontal view of the lower chest and upper abdomen demonstrates enteric catheter passing below diaphragm coiled over the gastric fundus. Gaseous distention of the small bowel is seen, compatible with obstruction seen on prior CT. Lung bases are clear. IMPRESSION: 1. Enteric catheter coiled over the gastric fundus. 2. Small bowel obstruction. Electronically Signed   By: Randa Ngo M.D.   On: 05/27/2021 17:50    Anti-infectives: Anti-infectives (From admission, onward)    None       Assessment/Plan SBO from Crohn's vs adhesive disease FEN - NPO, NGT VTE - lovenox ID - no issues Disposition - inpatient   LOS: 1 day   I reviewed last 24 h vitals and pain scores, last 48 h intake and output, last 24 h labs and trends, and last 24 h imaging results.  This care required high  level of medical decision making.   [facility] 05/28/2021, 9:13 AM Please see Amion for pager number during day hours 7:00am-4:30pm or 7:00am -11:30am on weekends

## 2021-05-28 NOTE — Plan of Care (Signed)
  Problem: Education: Goal: Knowledge of General Education information will improve Description Including pain rating scale, medication(s)/side effects and non-pharmacologic comfort measures Outcome: Progressing   

## 2021-05-28 NOTE — Progress Notes (Signed)
Transition of Care [facility]) Screening Note  Patient Details  Name: [name] Date of Birth: 12/04/1975  Transition of Care [facility]) CM/SW Contact:    Sherie Don, LCSW Phone Number: 05/28/2021, 10:09 AM  Transition of Care Department [facility]) has reviewed patient and no TOC needs have been identified at this time. We will continue to monitor patient advancement through interdisciplinary progression rounds. If new patient transition needs arise, please place a TOC consult.

## 2021-05-29 LAB — BASIC METABOLIC PANEL
Anion gap: 6 (ref 5–15)
BUN: 13 mg/dL (ref 6–20)
CO2: 26 mmol/L (ref 22–32)
Calcium: 8.3 mg/dL — ABNORMAL LOW (ref 8.9–10.3)
Chloride: 104 mmol/L (ref 98–111)
Creatinine, Ser: 0.8 mg/dL (ref 0.44–1.00)
GFR, Estimated: >60 mL/min (ref >60)
Glucose, Bld: 75 mg/dL (ref 70–99)
Potassium: 3.3 mmol/L — ABNORMAL LOW (ref 3.5–5.1)
Sodium: 136 mmol/L (ref 135–145)

## 2021-05-29 MED ORDER — POTASSIUM CHLORIDE 10 MEQ/100ML IV SOLN
10.0000 meq | INTRAVENOUS | Status: AC
  Filled 2021-05-29: qty 100

## 2021-05-29 MED ORDER — POTASSIUM CHLORIDE CRYS ER 20 MEQ PO TBCR: 40.0000 meq | EXTENDED_RELEASE_TABLET | Freq: Once | ORAL | Status: DC

## 2021-05-29 NOTE — Plan of Care (Signed)

## 2021-05-29 NOTE — Progress Notes (Signed)
Progress Note   Patient: [name] JQB:[id number] DOB: 1975-11-25 DOA: 05/27/2021     2  DOS: the patient was seen and examined on 05/29/2021   Brief hospital course: This 46 years old female with PMH significant for Crohn's disease, breast cancer undergoing chemotherapy, significant small and large intestinal disease required abdominal colectomy with ileorectal anastomosis.  She presented in the ED with complaints of nausea, vomiting and abdominal pain that started last night.  Patient reports having similar episode few months back and found to have small bowel obstruction which resolved with conservative management. CT abdomen showed Small-bowel obstruction with transition point at the ileosigmoid anastomosis. Twisting and kinking at the transition point, likely due to adhesions. Small volume abdominopelvic free fluid. No free air. Patient is admitted for small bowel obstruction, general surgery and GI is consulted.  Patient is kept NPO , NG tube decompression and IV hydration.    Assessment and Plan: * SBO (small bowel obstruction) ([place]) Patient presented with cramping abdominal pain associated with nausea, vomiting and hiccups. Hiccups and pain was so intense, sent from cancer center. CT abdomen showed small bowel obstruction. Continue IV hydration, bowel rest, NG decompression. Adequate pain control with Dilaudid. Continue IV Zofran as needed. General surgery consulted.  NG tube discontinued. Patient reports having 2 bowel movements,  feeling much improved. Consider clear liquid diet.  Leucocytosis This could be reactive.  No signs of infection noted. Leukocytosis resolved.  Crohn's disease of small and large intestines with complication ([place])- (present on admission) Patient reports having Crohn's disease and been on mesalamine. She reports having significant small and large intestinal disease,  underwent colectomy with ileorectal anastomosis. General surgery consulted, did  not seem like Crohn's flare.  Continue SBO protocol.  Malignant neoplasm of upper-outer quadrant of right breast in female, estrogen receptor positive ([place]) Patient is regularly following up with Dr. Payton Mccallum and getting chemotherapy and immunotherapy via Port-A-Cath. Continue outpatient follow-up.  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- (present on admission) H&H remains stable.   Subjective: Patient was seen and examined at bedside, overnight events noted. Patient reports feeling better, She reports having bowel movements, able to pass flatus.  Physical Exam: Vitals:   05/28/21 1227 05/28/21 1647 05/28/21 2107 05/29/21 0444  BP: 125/85 (!) 126/93 136/86 127/83  Pulse: (!) 104 (!) 110 (!) 107 99  Resp: 15 15 18 17   Temp: 98.3 F (36.8 C) 99.6 F (37.6 C) 99.6 F (37.6 C) 98.7 F (37.1 C)  TempSrc: Oral Oral Oral Oral  SpO2: 98% 98% 100% 99%  Weight:      Height:       Physical Exam Vitals and nursing note reviewed.  Constitutional:      Appearance: Normal appearance. She is normal weight.  HENT:     Head: Normocephalic and atraumatic.  Eyes:     Conjunctiva/sclera: Conjunctivae normal.  Cardiovascular:     Rate and Rhythm: Normal rate and regular rhythm.     Heart sounds: Normal heart sounds.  Pulmonary:     Effort: Pulmonary effort is normal.     Breath sounds: Normal breath sounds.  Abdominal:     General: Bowel sounds are normal. There is no distension.     Palpations: Abdomen is soft.     Tenderness: There is no abdominal tenderness.  Skin:    General: Skin is warm and dry.  Neurological:     General: No focal deficit present.     Mental Status:  She is alert and oriented to person, place, and time. Mental status is at baseline.  Psychiatric:        Mood and Affect: Mood normal.        Behavior: Behavior normal.      Data Reviewed: I have Reviewed nursing notes, Vitals, and Lab results since pt's last encounter.  Pertinent lab results CBC BMP I have ordered test including CBC BMP I have reviewed the last note from general surgery,  I have discussed pt's care plan and test results with patient.   Family Communication: No family at bedside  Disposition: Status is: Inpatient Remains inpatient appropriate because: Admitted for SBO, requiring IV hydration, bowel rest and pain control.    Planned Discharge Destination: Home  Time spent: 35 minutes  Author: Shawna Clamp, MD 05/29/2021 11:40 AM  For on call review [email or web address].

## 2021-05-29 NOTE — Progress Notes (Signed)
Patient refused Dr. Dwyane Dee, MD's orders of 2 runs of IV Potassium today. Paged Dr. [name], MD to ask about the patient having her diet advanced. Dr. Marcello Moores, MD requested to keep her NPO and try minimal fluids/ice chips. Patient also refused oral Potassium when it was changed to oral route by Dr. Dwyane Dee, MD. Patient also refused Lovenox for VTE. Dr. Dwyane Dee, MD was made aware of patient's refusals. Patient walked in the hall today. SCDs also ordered and put in room for VTE (Received SCD order from Dr. Dwyane Dee, MD).

## 2021-05-29 NOTE — Plan of Care (Signed)
  Problem: Education: Goal: Knowledge of General Education information will improve Description Including pain rating scale, medication(s)/side effects and non-pharmacologic comfort measures Outcome: Progressing   Problem: Health Behavior/Discharge Planning: Goal: Ability to manage health-related needs will improve Outcome: Progressing   

## 2021-05-30 ENCOUNTER — Ambulatory Visit: Payer: Managed Care, Other (non HMO)

## 2021-05-30 ENCOUNTER — Encounter: Payer: Self-pay | Admitting: Hematology and Oncology

## 2021-05-30 DIAGNOSIS — K56609 Unspecified intestinal obstruction, unspecified as to partial versus complete obstruction: Secondary | ICD-10-CM

## 2021-05-30 LAB — BASIC METABOLIC PANEL
Anion gap: 10 (ref 5–15)
BUN: 12 mg/dL (ref 6–20)
CO2: 23 mmol/L (ref 22–32)
Calcium: 8.4 mg/dL — ABNORMAL LOW (ref 8.9–10.3)
Chloride: 102 mmol/L (ref 98–111)
Creatinine, Ser: 0.8 mg/dL (ref 0.44–1.00)
GFR, Estimated: >60 mL/min (ref >60)
Glucose, Bld: 52 mg/dL — ABNORMAL LOW (ref 70–99)
Potassium: 3.5 mmol/L (ref 3.5–5.1)
Sodium: 135 mmol/L (ref 135–145)

## 2021-05-30 MED ORDER — HEPARIN SOD (PORK) LOCK FLUSH 100 UNIT/ML IV SOLN
500.0000 [IU] | INTRAVENOUS | Status: AC | PRN
  Administered 2021-05-30: 500 [IU]
  Filled 2021-05-30: qty 5

## 2021-05-30 NOTE — Plan of Care (Signed)
Problem: Education: Goal: Knowledge of General Education information will improve Description: Including pain rating scale, medication(s)/side effects and non-pharmacologic comfort measures Outcome: Progressing   Problem: Clinical Measurements: Goal: Diagnostic test results will improve Outcome: Progressing   Problem: Coping: Goal: Level of anxiety will decrease Outcome: Progressing   Ivan Anchors, RN 05/30/21 9:02 AM

## 2021-05-30 NOTE — Plan of Care (Signed)
Patient discharged home via private vehicle following port being de-accessed by IV team. Ivan Anchors, RN 05/30/21 4:58 PM

## 2021-05-30 NOTE — Discharge Instructions (Signed)
Advised to follow-up with primary care physician in 1 week. Advised to follow-up with oncologist Dr. Payton Mccallum tomorrow. Small bowel obstruction has resolved with conservative management.

## 2021-05-30 NOTE — Progress Notes (Signed)
Subjective: CC: Hungry Reports on day of admission she had no flatus or bm. Bowel function return on Saturday and all symptoms resolved yesterday. Denies any further abdominal pain, bloating, n/v. She had 5 bm's yesterday that were mainly liquid but did have some flecks of more formed stool in them. Usually has 3 soft bm's per day. No PRN meds. Patient is currently npo but drank ~1/2 galloon water yesterday. Reports she is hungry.   Objective: Vital signs in last 24 hours: Temp:  [98 F (36.7 C)-98.9 F (37.2 C)] 98 F (36.7 C) (02/20 0524) Pulse Rate:  [86-90] 86 (02/20 0524) Resp:  [15-16] 15 (02/20 0524) BP: (119-134)/(81-84) 119/81 (02/20 0524) SpO2:  [99 %-100 %] 99 % (02/20 0524) Last BM Date : 05/29/21  Intake/Output from previous day: 02/19 0701 - 02/20 0700 In: 2779.3 [I.V.:2779.3] Out: -  Intake/Output this shift: No intake/output data recorded.  PE: Gen:  Alert, NAD, pleasant Pulm: Rate and effort normal Abd: Soft, ND, NT +BS, prior midline wound well healed Psych: A&Ox3  Skin: no rashes noted, warm and dry  Lab Results:  Recent Labs    05/27/21 1340 05/28/21 0429  WBC 14.0* 4.8  HGB 13.7 11.0*  HCT 41.2 34.6*  PLT 321 235   BMET Recent Labs    05/29/21 0500 05/30/21 0426  NA 136 135  K 3.3* 3.5  CL 104 102  CO2 26 23  GLUCOSE 75 52*  BUN 13 12  CREATININE 0.80 0.80  CALCIUM 8.3* 8.4*   PT/INR No results for input(s): LABPROT, INR in the last 72 hours. CMP     Component Value Date/Time   NA 135 05/30/2021 0426   NA 135 01/21/2020 1056   K 3.5 05/30/2021 0426   CL 102 05/30/2021 0426   CO2 23 05/30/2021 0426   GLUCOSE 52 (L) 05/30/2021 0426   BUN 12 05/30/2021 0426   BUN 12 01/21/2020 1056   CREATININE 0.80 05/30/2021 0426   CREATININE 0.79 01/21/2021 0817   CALCIUM 8.4 (L) 05/30/2021 0426   PROT 8.6 (H) 05/27/2021 1340   ALBUMIN 4.9 05/27/2021 1340   AST 21 05/27/2021 1340   AST 17 01/21/2021 0817   ALT 15 05/27/2021 1340    ALT 13 01/21/2021 0817   ALKPHOS 134 (H) 05/27/2021 1340   BILITOT 0.9 05/27/2021 1340   BILITOT 0.5 01/21/2021 0817   GFRNONAA >60 05/30/2021 0426   GFRNONAA >60 01/21/2021 0817   GFRAA 108 01/21/2020 1056   Lipase     Component Value Date/Time   LIPASE 37 05/27/2021 1340    Studies/Results: DG Abd Portable 1V  Result Date: 05/28/2021 CLINICAL DATA:  Small-bowel obstruction EXAM: PORTABLE ABDOMEN - 1 VIEW COMPARISON:  Previous studies including the examination done earlier today FINDINGS: Tip of enteric tube is seen in the stomach. There is dilation of small-bowel loops measuring up to 4.7 cm. There is possible slight decrease in degree of small-bowel dilation. Stomach is not distended. There is no demonstrable gas in colon, possibly due to previous colectomy. There is coarse calcification in the left side of pelvis, possibly calcified uterine fibroid. IMPRESSION: There is abnormal dilation of small-bowel loops suggesting distal small bowel obstruction. There is possible slight decrease in degree of small-bowel dilation. Electronically Signed   By: Elmer Picker M.D.   On: 05/28/2021 13:09    Anti-infectives: Anti-infectives (From admission, onward)    None        Assessment/Plan SBO Hx Crohn's s/p  robotic assisted subtotal colectomy with ileo-sigmoid anastomosis 06/27/19 by Dr. Marcello Moores.  On maintenance Lialda/Mesalamine - CT 2/17 w/ small-bowel obstruction with transition point at the ileosigmoid anastomosis. Some free fluid, no free air. No obvious inflammatory changes to suggest this is from a Crohn's flare. EDP discussed with GI who recommended holding on any specific Crohn's therapy at this time.  - Hx SBO 01/2021 that improved with conservative management  - NGT out 2/19. Tolerating cld w/ ROBF. Clinically improving - symptoms have resolved and NT on exam. Xray's can lag behind clinical improvement. Adv to FLD - Keep K > 4, Mg > 2 and mobilize for bowel function -  Hopefully will continue to improve with non-operative therapy. We will follow with you   FEN - FLD K 3.5 (replacing - declined yesterday's dose) VTE - SCDs, Lovenox  ID - None currently. Afebrile. WBC 4.8  Breast CA on Chemo - followed by Dr. Sonny Dandy. Hx bilateral nipple sparing mastectomy 11/09/20 by [name]  Hx iron deficiency anemia   I reviewed the last 24 hours of vital signs, I/O, pain scores, MAR, hospitalist and nursing notes.  This care required moderate level of medical decision making.    LOS: 3 days    Jillyn Ledger , [facility] 05/30/2021, 8:24 AM Please see Amion for pager number during day hours 7:00am-4:30pm

## 2021-05-30 NOTE — Progress Notes (Signed)
Progress Note   Patient: [name] QVZ:[id number] DOB: 1975/06/08 DOA: 05/27/2021     3  DOS: the patient was seen and examined on 05/30/2021   Brief hospital course: This 46 years old female with PMH significant for Crohn's disease, breast cancer undergoing chemotherapy, significant small and large intestinal disease required abdominal colectomy with ileorectal anastomosis.  She presented in the ED with complaints of nausea, vomiting and abdominal pain that started last night.  Patient reports having similar episode few months back and found to have small bowel obstruction which resolved with conservative management. CT abdomen showed Small-bowel obstruction with transition point at the ileosigmoid anastomosis. Twisting and kinking at the transition point, likely due to adhesions. Small volume abdominopelvic free fluid. No free air. Patient is admitted for small bowel obstruction, general surgery and GI is consulted.  Patient was kept n.p.o., NG tube was inserted.  Patient has significantly improved , She has bowel movements multiple times yesterday,  denies any abdominal pain, started on full liquid diet,  will advance as tolerated.  Assessment and Plan: * SBO (small bowel obstruction) ([place]) Patient presented with cramping abdominal pain associated with nausea, vomiting and hiccups. Hiccups and pain was so intense, sent from cancer center. CT abdomen showed small bowel obstruction. Continue IV hydration, bowel rest, NG decompression. Adequate pain control with Dilaudid. Continue IV Zofran as needed. General surgery consulted.  NG tube discontinued. Patient reports having 5 bowel movements,  feeling much improved. She reports abdominal pain has resolved, started on full liquid diet Advance diet as tolerated.  Leucocytosis This could be reactive.  No signs of infection noted. Leukocytosis resolved.  Crohn's disease of small and large intestines with complication ([place])- (present on  admission) Patient reports having Crohn's disease and been on mesalamine. She reports having significant small and large intestinal disease,  underwent colectomy with ileorectal anastomosis. General surgery consulted, did not seem like Crohn's flare.  Continue SBO protocol.  Malignant neoplasm of upper-outer quadrant of right breast in female, estrogen receptor positive ([place]) Patient is regularly following up with Dr. Payton Mccallum and getting chemotherapy and immunotherapy via Port-A-Cath. Continue outpatient follow-up.  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- (present on admission) H&H remains stable.   Subjective: Patient was seen and examined at bedside, overnight events noted. Patient reports feeling better, has bowel movements yesterday, denies any abdominal pain.  Physical Exam: Vitals:   05/29/21 0444 05/29/21 2041 05/30/21 0524 05/30/21 1040  BP: 127/83 134/84 119/81 124/89  Pulse: 99 90 86 95  Resp: 17 16 15 16   Temp: 98.7 F (37.1 C) 98.9 F (37.2 C) 98 F (36.7 C) 98 F (36.7 C)  TempSrc: Oral Oral Oral Oral  SpO2: 99% 100% 99% 100%  Weight:      Height:       General exam: Appears comfortable, not in any acute distress. Respiratory : Clear to auscultation bilaterally, no wheezing, no crackles, respiratory effort normal. Cardiovascular : S1-S2 heard, regular rate and rhythm, no murmur Gastrointestinal : Abdominal soft, nontender, nondistended, BS+ Central nervous system: Alert, oriented x3, no neurological deficits. Extremities: No edema, no cyanosis, no clubbing. Psychiatry: Mood and insight normal   Data Reviewed: I have Reviewed nursing notes, Vitals, and Lab results since pt's last encounter. Pertinent lab results CBC BMP I have ordered test including CBC BMP I have reviewed the last note from general surgery,  I have discussed pt's care plan and test results with patient.   Family Communication: No  family at  bedside  Disposition: Status is: Inpatient Remains inpatient appropriate because: Admitted for SBO, requiring IV hydration, bowel rest and pain control.    Planned Discharge Destination: Home  Time spent: 35 minutes  Author: Shawna Clamp, MD 05/30/2021 11:58 AM  For on call review [email or web address].

## 2021-05-30 NOTE — Discharge Summary (Signed)
Physician Discharge Summary   Patient: [name] MRN: [id number] DOB: 10-28-1975  Admit date:     05/27/2021  Discharge date: 05/30/21  Discharge Physician: Shawna Clamp   PCP: Pcp, No   Recommendations at discharge:  Advised to follow-up with primary care physician in 1 week. Advised to follow-up with oncologist Dr. Payton Mccallum tomorrow. Small bowel obstruction has resolved with conservative management.  Discharge Diagnoses: Principal Problem:   SBO (small bowel obstruction) (HCC) Active Problems:   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   Malignant neoplasm of upper-outer quadrant of right breast in female, estrogen receptor positive ([place])   Port-A-Cath in place   Crohn's disease of small and large intestines with complication ([place])   Leucocytosis  Resolved Problems:   * No resolved hospital problems. [facility] Course: This 46 years old female with PMH significant for Crohn's disease, breast cancer undergoing chemotherapy, significant small and large intestinal disease required abdominal colectomy with ileorectal anastomosis.  She presented in the ED with complaints of nausea, vomiting and abdominal pain that started last night.  Patient reports having similar episode few months back and found to have small bowel obstruction which resolved with conservative management. CT abdomen showed Small-bowel obstruction with transition point at the ileosigmoid anastomosis. Twisting and kinking at the transition point, likely due to adhesions. Small volume abdominopelvic free fluid. No free air. Patient is admitted for small bowel obstruction, general surgery and GI is consulted.  Patient was kept n.p.o., NG tube was inserted.  Patient has significantly improved , She has bowel movements multiple times ,  denies any abdominal pain, started on full liquid diet, advance to soft diet.  Small bowel obstruction has resolved with conservative management.   Patient wants to be discharged.  Patient is being discharged and follow-up with general surgery.  Assessment and Plan: * SBO (small bowel obstruction) ([place]) Patient presented with cramping abdominal pain associated with nausea, vomiting and hiccups. Hiccups and pain was so intense, sent from cancer center. CT abdomen showed small bowel obstruction. Continue IV hydration, bowel rest, NG decompression. Adequate pain control with Dilaudid. Continue IV Zofran as needed. General surgery consulted.  NG tube discontinued. Patient reports having 5 bowel movements,  feeling much improved. She reports abdominal pain has resolved, started on full liquid diet Advance diet as tolerated.  SBO resolved.  Cleared from general surgery to be discharged  Leucocytosis This could be reactive.  No signs of infection noted. Leukocytosis resolved.  Crohn's disease of small and large intestines with complication ([place])- (present on admission) Patient reports having Crohn's disease and been on mesalamine. She reports having significant small and large intestinal disease,  underwent colectomy with ileorectal anastomosis. General surgery consulted, did not seem like Crohn's flare.   SBO resolved  Malignant neoplasm of upper-outer quadrant of right breast in female, estrogen receptor positive ([place]) Patient is regularly following up with Dr. Payton Mccallum and getting chemotherapy and immunotherapy via Port-A-Cath. Continue outpatient follow-up.  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- (present on admission) H&H remains stable.    Consultants: General surgery Procedures performed: None Disposition: Home Diet recommendation:  Discharge Diet Orders (From admission, onward)     Start     Ordered   05/30/21 0000  Diet - low sodium heart healthy        05/30/21 1517   05/30/21 0000  Diet Carb Modified        05/30/21 1517  Carb modified diet  DISCHARGE  MEDICATION: Allergies as of 05/30/2021       Reactions   Nsaids Other (See Comments)   Crohns disease/ IBD   Other    BLOOD PRODUCT REFUSAL        Medication List     STOP taking these medications    tamoxifen 10 MG tablet Commonly known as: NOLVADEX       TAKE these medications    acetaminophen 500 MG tablet Commonly known as: TYLENOL Take 500-1,000 mg by mouth every 6 (six) hours as needed for mild pain.   lidocaine-prilocaine cream Commonly known as: EMLA Apply 1 application topically as needed (Prior to port access).   mesalamine 1.2 g EC tablet Commonly known as: Lialda Take 2 tablets (2.4 g total) by mouth 2 (two) times daily with a meal.   multivitamin with minerals tablet Take 1 tablet by mouth daily.   polyethylene glycol 17 g packet Commonly known as: MiraLax Take 17 g by mouth daily as needed.        Follow-up Information     [name], MD Follow up in 1 week(s).   Specialty: Hematology and Oncology Contact information: [place] [address] [phone]                 Discharge Exam: Danley Danker Weights   05/27/21 1503  Weight: 67 kg   General exam: Appears comfortable, not in any acute distress. Respiratory system: Clear to auscultation bilaterally, no wheezing, no crackles respiratory for normal Cardiovascular system: S1-S2 heard, regular rate and rhythm, no murmur Gastrointestinal system: .  Abdominal soft, nontender, nondistended, BS+ Central nervous system: Alert and oriented x3, no neurological deficits Extremities: No edema, no cyanosis, no clubbing Psychiatry: Mood and insight normal   Condition at discharge: good  The results of significant diagnostics from this hospitalization (including imaging, microbiology, ancillary and laboratory) are listed below for reference.   Imaging Studies: DG Abd 1 View  Result Date: 05/28/2021 CLINICAL DATA:  NG tube placement. EXAM: ABDOMEN - 1 VIEW  COMPARISON:  05/27/2021. FINDINGS: Multiple loops of dilated small bowel with air-fluid levels are noted in the abdomen measuring up to 4.7 cm in diameter. An enteric tube side port is seen at the gastroesophageal junction and should be advanced approximately 5 cm. No radio-opaque calculi. Left chest port is stable. Surgical clips are present in the right axilla. IMPRESSION: 1. Multiple distended loops of small bowel in the upper abdomen with air-fluid levels, compatible with small bowel obstruction. 2. The side port of the enteric tube is at the gastroesophageal junction and should be advanced approximately 5 cm. Electronically Signed   By: Brett Fairy M.D.   On: 05/28/2021 00:39   CT ABDOMEN PELVIS W CONTRAST  Result Date: 05/27/2021 CLINICAL DATA:  Abdominal pain, acute, nonlocalized EXAM: CT ABDOMEN AND PELVIS WITH CONTRAST TECHNIQUE: Multidetector CT imaging of the abdomen and pelvis was performed using the standard protocol following bolus administration of intravenous contrast. RADIATION DOSE REDUCTION: This exam was performed according to the departmental dose-optimization program which includes automated exposure control, adjustment of the mA and/or kV according to patient size and/or use of iterative reconstruction technique. CONTRAST:  119m OMNIPAQUE IOHEXOL 300 MG/ML  SOLN COMPARISON:  Abdominal radiograph 01/26/2021, CT 01/26/2021 FINDINGS: Lower chest: Small peripheral opacity in the right lung base, appears to be a subsolid nodule with solid component measuring 3 mm (series 6, image 22). Per Fleischner guidelines, no routine follow-up imaging is recommended.  There are bilateral saline breast implants with radial folds. Hepatobiliary: No focal liver abnormality is seen. The gallbladder is unremarkable. Pancreas: Unremarkable. No pancreatic ductal dilatation or surrounding inflammatory changes. Spleen: Normal in size without focal abnormality. Adrenals/Urinary Tract: Adrenal glands are  unremarkable. Unchanged 7 mm left adrenal nodule, statistically likely to be an adenoma. Normal right adrenal gland. No hydronephrosis or nephrolithiasis. Stomach/Bowel: Prior subtotal colectomy with ileosigmoid anastomosis. Diffusely dilated small bowel with air-fluid levels, and transition point in the central pelvis (coronal images 44-60), at the ileosigmoid anastomosis. There is twisting and kinking of the anastomosis. Vascular/Lymphatic: There are few prominent mesenteric lymph nodes in the mid abdomen. No significant vascular findings are present. No AAA. Reproductive: Unchanged multiple uterine fibroids, a few of which are calcified. Other: Small volume abdominopelvic ascites.  No free air. Musculoskeletal: No acute or significant osseous findings. IMPRESSION: Small-bowel obstruction with transition point at the ileosigmoid anastomosis. Twisting and kinking at the transition point, likely due to adhesions. Small volume abdominopelvic free fluid. No free air. Electronically Signed   By: Maurine Simmering M.D.   On: 05/27/2021 16:16   DG Abd Portable 1V  Result Date: 05/28/2021 CLINICAL DATA:  Small-bowel obstruction EXAM: PORTABLE ABDOMEN - 1 VIEW COMPARISON:  Previous studies including the examination done earlier today FINDINGS: Tip of enteric tube is seen in the stomach. There is dilation of small-bowel loops measuring up to 4.7 cm. There is possible slight decrease in degree of small-bowel dilation. Stomach is not distended. There is no demonstrable gas in colon, possibly due to previous colectomy. There is coarse calcification in the left side of pelvis, possibly calcified uterine fibroid. IMPRESSION: There is abnormal dilation of small-bowel loops suggesting distal small bowel obstruction. There is possible slight decrease in degree of small-bowel dilation. Electronically Signed   By: Elmer Picker M.D.   On: 05/28/2021 13:09   DG Abd Portable 1V-Small Bowel Obstruction Protocol-initial, 8 hr  delay  Result Date: 05/28/2021 CLINICAL DATA:  Small-bowel obstruction, 8 hour delay. EXAM: PORTABLE ABDOMEN - 1 VIEW COMPARISON:  05/28/2021. FINDINGS: There is redemonstration of multiple dilated loops of small bowel in the abdomen measuring up to 4.7 cm. Small amount of residual contrast is present in the left upper quadrant. An enteric tube is noted in the stomach. A calcified lesion is noted in the pelvis on the left, compatible with known fibroid. Excreted contrast is present in the urinary bladder. IMPRESSION: 1. Multiple distended loops of small bowel in the abdomen, compatible with known small bowel obstruction and not significantly changed from the prior exam. 2. Enteric tube terminates in the stomach. Electronically Signed   By: Brett Fairy M.D.   On: 05/28/2021 03:09   DG Abd Portable 1V-Small Bowel Protocol-Position Verification  Result Date: 05/27/2021 CLINICAL DATA:  Enteric catheter placement, breast cancer EXAM: PORTABLE ABDOMEN - 1 VIEW COMPARISON:  01/26/2021 FINDINGS: Upright frontal view of the lower chest and upper abdomen demonstrates enteric catheter passing below diaphragm coiled over the gastric fundus. Gaseous distention of the small bowel is seen, compatible with obstruction seen on prior CT. Lung bases are clear. IMPRESSION: 1. Enteric catheter coiled over the gastric fundus. 2. Small bowel obstruction. Electronically Signed   By: Randa Ngo M.D.   On: 05/27/2021 17:50   US THYROID  Result Date: 05/25/2021 CLINICAL DATA:  Palpable abnormality. Thyromegaly on physical examination. EXAM: THYROID ULTRASOUND TECHNIQUE: Ultrasound examination of the thyroid gland and adjacent soft tissues was performed. COMPARISON:  None. FINDINGS: Parenchymal Echotexture: Moderately heterogenous  Isthmus: Normal in size measures 0.4 cm in diameter Right lobe: Borderline enlarged measuring 4.5 x 2.1 x 2.1 cm Left lobe: Borderline enlarged measuring 4.7 x 2.0 x 2.0 cm  _________________________________________________________ Estimated total number of nodules >/= 1 cm: 0 Number of spongiform nodules >/=  2 cm not described below (TR1): 0 Number of mixed cystic and solid nodules >/= 1.5 cm not described below (TR2): 0 _________________________________________________________ There is an approximately 0.9 x 0.9 x 0.6 cm anechoic cyst within the right-sided the thyroid isthmus (labeled 1), which does not meet criteria to recommend percutaneous sampling or continued dedicated follow-up. There is an approximately 0.7 x 0.6 x 0.3 cm spongiform/benign-appearing nodule within mid aspect the right lobe of the thyroid (labeled 2), which does not meet criteria to recommend percutaneous sampling or continued dedicated follow-up There is an approximately 0.7 x 0.6 x 0.5 cm mixed partially cystic, partially solid nodule within the mid, posterior aspect the right lobe of the thyroid (labeled 3), which does not meet criteria to recommend percutaneous sampling or continued dedicated follow-up There is an approximately 0.7 x 0.5 x 0.3 cm spongiform/benign-appearing nodule within the inferior pole of the right lobe of the thyroid (labeled 4), which does not meet criteria to recommend percutaneous sampling or continued dedicated follow-up _________________________________________________________ There is an approximately 0.8 x 0.6 x 0.5 cm spongiform/benign-appearing nodule within mid aspect the left lobe of the thyroid (labeled 5), which does not meet imaging criteria to recommend percutaneous sampling or continued dedicated follow-up. IMPRESSION: 1. Borderline enlarged and moderately heterogeneous appearing thyroid without worrisome nodule or mass. 2. None of the discretely measured punctate (sub 0.9 cm) spongiform/benign-appearing nodules or cysts meet imaging criteria to recommend percutaneous sampling or continued dedicated follow-up. The above is in keeping with the ACR TI-RADS recommendations  - J Am Coll Radiol 2017;14:587-595. Electronically Signed   By: Sandi Mariscal M.D.   On: 05/25/2021 15:11    Microbiology: Results for orders placed or performed during the hospital encounter of 05/27/21  Resp Panel by RT-PCR (Flu A&B, Covid) Nasopharyngeal Swab     Status: None   Collection Time: 05/27/21  4:43 PM   Specimen: Nasopharyngeal Swab; Nasopharyngeal(NP) swabs in vial transport medium  Result Value Ref Range Status   SARS Coronavirus 2 by RT PCR NEGATIVE NEGATIVE Final    Comment: (NOTE) SARS-CoV-2 target nucleic acids are NOT DETECTED.  The SARS-CoV-2 RNA is generally detectable in upper respiratory specimens during the acute phase of infection. The lowest concentration of SARS-CoV-2 viral copies this assay can detect is 138 copies/mL. A negative result does not preclude SARS-Cov-2 infection and should not be used as the sole basis for treatment or other patient management decisions. A negative result may occur with  improper specimen collection/handling, submission of specimen other than nasopharyngeal swab, presence of viral mutation(s) within the areas targeted by this assay, and inadequate number of viral copies(<138 copies/mL). A negative result must be combined with clinical observations, patient history, and epidemiological information. The expected result is Negative.  Fact Sheet for Patients:  EntrepreneurPulse.com.au  Fact Sheet for Healthcare Providers:  IncredibleEmployment.be  This test is no t yet approved or cleared by the [place] FDA and  has been authorized for detection and/or diagnosis of SARS-CoV-2 by FDA under an Emergency Use Authorization (EUA). This EUA will remain  in effect (meaning this test can be used) for the duration of the COVID-19 declaration under Section 564(b)(1) of the Act, 21 U.S.C.section 360bbb-3(b)(1), unless the authorization is terminated  or revoked sooner.       Influenza A by PCR  NEGATIVE NEGATIVE Final   Influenza B by PCR NEGATIVE NEGATIVE Final    Comment: (NOTE) The Xpert Xpress SARS-CoV-2/FLU/RSV plus assay is intended as an aid in the diagnosis of influenza from Nasopharyngeal swab specimens and should not be used as a sole basis for treatment. Nasal washings and aspirates are unacceptable for Xpert Xpress SARS-CoV-2/FLU/RSV testing.  Fact Sheet for Patients: EntrepreneurPulse.com.au  Fact Sheet for Healthcare Providers: IncredibleEmployment.be  This test is not yet approved or cleared by the [place] FDA and has been authorized for detection and/or diagnosis of SARS-CoV-2 by FDA under an Emergency Use Authorization (EUA). This EUA will remain in effect (meaning this test can be used) for the duration of the COVID-19 declaration under Section 564(b)(1) of the Act, 21 U.S.C. section 360bbb-3(b)(1), unless the authorization is terminated or revoked.  Performed at [facility], [place] [address] [place], [place] [address]     Labs: CBC: Recent Labs  Lab 05/27/21 1340 05/28/21 0429  WBC 14.0* 4.8  NEUTROABS 12.8*  --   HGB 13.7 11.0*  HCT 41.2 34.6*  MCV 80.9 84.4  PLT 321 437   Basic Metabolic Panel: Recent Labs  Lab 05/27/21 1340 05/28/21 0429 05/29/21 0500 05/30/21 0426  NA 136 137 136 135  K 3.7 3.4* 3.3* 3.5  CL 99 101 104 102  CO2 26 28 26 23   GLUCOSE 150* 105* 75 52*  BUN 15 18 13 12   CREATININE 0.85 0.98 0.80 0.80  CALCIUM 10.3 8.6* 8.3* 8.4*  MG 1.9 1.9  --   --   PHOS  --  4.1  --   --    Liver Function Tests: Recent Labs  Lab 05/27/21 1340  AST 21  ALT 15  ALKPHOS 134*  BILITOT 0.9  PROT 8.6*  ALBUMIN 4.9   CBG: No results for input(s): GLUCAP in the last 168 hours.  Discharge time spent: greater than 30 minutes.  Signed: Shawna Clamp, MD [facility] 05/30/2021

## 2021-05-30 NOTE — Plan of Care (Signed)

## 2021-05-31 ENCOUNTER — Telehealth: Payer: Self-pay | Admitting: Hematology and Oncology

## 2021-05-31 NOTE — Telephone Encounter (Signed)
Sch per 2/20 inbasket, left msg with pt

## 2021-06-02 NOTE — Progress Notes (Signed)
Patient Care Team: Pcp, No as PCP - General [name], RN as Oncology Nurse Navigator [name], RN as Oncology Nurse Navigator [name], MD as Consulting Physician (Colon and Rectal Surgery) [name], MD as Consulting Physician (Gastroenterology) [name], MD as Consulting Physician (General Surgery) [name], MD as Consulting Physician (Hematology and Oncology)  DIAGNOSIS:    ICD-10-CM   1. 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 palpable right breast mass measuring about 7 cm x 5 and half centimeters, palpable right axillary lymph node with concern for skin invasion    06/08/2020 Cancer Staging   Staging form: Breast, AJCC 8th Edition - Clinical: Stage IIIB (cT4d, cN1, cM0, G3, ER+, PR-, HER2+) - Signed by [name], MD on 07/07/2020 Histologic grading system: 3 grade system    06/24/2020 Genetic Testing   No pathogenic variants detected in [facility]RNA[facility].  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  The report date is June 24, 2020.   The CustomNext-Cancer+RNAinsight panel offered by [facility] includes sequencing and rearrangement analysis for the following 47 genes:  APC, ATM, AXIN2, BARD1, BMPR1A, BRCA1, BRCA2, BRIP1, CDH1, CDK4, CDKN2A, CHEK2, DICER1, EPCAM, GREM1, HOXB13, MEN1, MLH1, MSH2, MSH3, MSH6, MUTYH, NBN, NF1, NF2, NTHL1, PALB2, PMS2, POLD1, POLE,  PTEN, RAD51C, RAD51D, RECQL, RET, SDHA, SDHAF2, SDHB, SDHC, SDHD, SMAD4, SMARCA4, STK11, TP53, TSC1, TSC2, and VHL.  RNA data is routinely analyzed for use in variant interpretation for all genes.   06/25/2020 - 10/29/2020 Chemotherapy   TCHP x6 cycles   11/09/2020 Surgery   Bilateral mastectomies: Left mastectomy: Benign Right mastectomy: Residual microinvasive cancer status post neoadjuvant therapy, DCIS, 0/12 lymph nodes, ER +1%, PR negative, HER2 equivocal 2+ by [facility]   11/19/2020 -  Chemotherapy   Patient is on Treatment Plan : BREAST Trastuzumab  + Pertuzumab q21d x 13 cycles       CHIEF COMPLIANT: Follow-up of HER-2 positive right breast cancer  INTERVAL HISTORY: [name] is a 46 y.o. with above-mentioned history of HER-2 positive right breast cancer currently on Herceptin Perjeta maintenance. She presents to the clinic today for follow-up and toxicity check.    ALLERGIES:  is allergic to nsaids and other.  MEDICATIONS:  Current Outpatient Medications  Medication Sig Dispense Refill   acetaminophen (TYLENOL) 500 MG tablet Take 500-1,000 mg by mouth every 6 (six) hours as needed for mild pain.     lidocaine-prilocaine (EMLA) cream Apply 1 application topically as needed (Prior to port access). 30 g 2   mesalamine (LIALDA) 1.2 g EC tablet Take 2 tablets (2.4 g total) by mouth 2 (two) times daily with a meal. 120 tablet 11   Multiple Vitamins-Minerals (MULTIVITAMIN WITH MINERALS) tablet Take 1 tablet by mouth daily.     polyethylene glycol (MIRALAX) 17 g packet Take 17 g by mouth daily as needed. 14 each 0   No current facility-administered medications for  this visit.    PHYSICAL EXAMINATION: ECOG PERFORMANCE STATUS: 1 - Symptomatic but completely ambulatory  Vitals:   06/03/21 0940  BP: 123/88  Pulse: (!) 109  Resp: 18  Temp: (!) 97.4 F (36.3 C)  SpO2: 100%   Filed Weights   06/03/21 0940  Weight: 139 lb 4.8 oz (63.2 kg)    LABORATORY DATA:  I have reviewed  the data as listed CMP Latest Ref Rng & Units 05/30/2021 05/29/2021 05/28/2021  Glucose 70 - 99 mg/dL 52(L) 75 105(H)  BUN 6 - 20 mg/dL _0 Creatinine 0.44 - 1.00 mg/dL 0.80 0.80 0.98  Sodium 135 - 145 mmol/L 135 136 137  Potassium 3.5 - 5.1 mmol/L 3.5 3.3(L) 3.4(L)  Chloride 98 - 111 mmol/L 102 104 101  CO2 22 - 32 mmol/L _1 Calcium 8.9 - 10.3 mg/dL 8.4(L) 8.3(L) 8.6(L)  Total Protein 6.5 - 8.1 g/dL - - -  Total Bilirubin 0.3 - 1.2 mg/dL - - -  Alkaline Phos 38 - 126 U/L - - -  AST 15 - 41 U/L - - -  ALT 0 - 44 U/L - - -    Lab Results  Component Value Date   WBC 6.0 06/03/2021   HGB 11.1 (L) 06/03/2021   HCT 34.5 (L) 06/03/2021   MCV 81.8 06/03/2021   PLT 249 06/03/2021   NEUTROABS 4.5 06/03/2021    ASSESSMENT & PLAN:  Malignant neoplasm of upper-outer quadrant of right breast in female, estrogen receptor positive ([place]) 06/03/2020, US guided biopsy of the right breast 9 0 clock mass showed grade III IDC; Prognostics showed ER 40% positive, weak staining, PR positive 0 %, Her 2 3 +, Ki 67 30% 4dCN1M0 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 palpable right breast mass measuring about 7 cm x 5 and half centimeters, palpable right axillary lymph node with concern for skin invasion    Treatment Plan: 1. Neoadjuvant chemo with TCHP q 3 weeks X 6 followed by HP 2. Bilateral mastectomies: Left mastectomy: Benign Right mastectomy: Residual microinvasive cancer status post neoadjuvant therapy, DCIS, 0/12 lymph nodes, ER +1%, PR negative, HER2 equivocal 2+ by IHC 3. Adj XRT completed 02/23/2021 4. Adj Anti-estrogen therapy with tamoxifen to be started 06/08/2021 5. Neratinib --------------------------------------------------------------------------------------------------------- Current treatment: 1.  Herceptin Perjeta maintenance to be completed 07/07/2021 (her daughter is having a baby shower on 07/08/2021) 2. adjuvant tamoxifen started 04/24/2021   Echo 01/12/2021: EF  of 60-65% Echo January 2023: EF 65% (trace pericardial effusion: I assured her that it is not clinically significant.)    tamoxifen toxicities: She stopped tamoxifen after taking it for a few months She changed her diet profoundly and she eats mostly fish and Mediterranean diet Hospitalization 05/27/2021- 05/30/2021: Crohn's colitis, small bowel obstruction (due to adhesions) I discussed with her that it would be ideal to restart the tamoxifen at half a tablet every other day after she gets done with her infusions.  She will think about it.  Return to clinic every 3 weeks for Herceptin Perjeta maintenance and every 6 weeks to follow-up with me.    No orders of the defined types were placed in this encounter.  The patient has a good understanding of the overall plan. she agrees with it. she will call with any problems that may develop before the next visit here.  Total time spent: 30 mins including face to face time and time spent for planning, charting and coordination of care  Reniya Mcclees K  Lindi Adie, MD, MPH 06/03/2021  I, Thana Ates, am acting as scribe for Dr. [name].  I have reviewed the above documentation for accuracy and completeness, and I agree with the above.

## 2021-06-02 NOTE — Assessment & Plan Note (Signed)
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 palpable right breast mass measuring about 7 cm x 5 and half centimeters, palpable right axillary lymph node with concern for skin invasion  Treatment Plan: 1. Neoadjuvant chemo with TCHP q 3 weeks X 6 followed by HP 2. Bilateral mastectomies: Left mastectomy: Benign Right mastectomy: Residual microinvasive cancer status post neoadjuvant therapy, DCIS, 0/12 lymph nodes, ER +1%, PR negative, HER2 equivocal 2+ by IHC 3. Adj XRTcompleted 02/23/2021 4. Adj Anti-estrogen therapywith tamoxifen to be started 06/08/2021 5. Neratinib --------------------------------------------------------------------------------------------------------- Current treatment: 1. Herceptin Perjeta maintenance to be completed 07/07/2021 (her daughter is having a baby shower on 07/08/2021) 2. adjuvant tamoxifen started 04/24/2021  Echo 01/12/2021: EF of 60-65% Echo January 2023: EF 65% (trace pericardial effusion: I assured her that it is not clinically significant.)   tamoxifen toxicities: She has not started tamoxifen yet She changed her diet profoundly and she eats mostly fish and Mediterranean diet Hospitalization 05/27/2021- 05/30/2021: Crohn's colitis, small bowel obstruction (due to adhesions)  Return to clinic every 3 weeks for Herceptin Perjeta maintenance and every 6 weeks to follow-up with me.

## 2021-06-03 ENCOUNTER — Encounter: Payer: Self-pay | Admitting: Hematology and Oncology

## 2021-06-03 ENCOUNTER — Other Ambulatory Visit: Payer: Self-pay

## 2021-06-03 ENCOUNTER — Other Ambulatory Visit: Payer: Self-pay | Admitting: *Deleted

## 2021-06-03 ENCOUNTER — Inpatient Hospital Stay: Payer: Managed Care, Other (non HMO) | Admitting: Hematology and Oncology

## 2021-06-03 ENCOUNTER — Inpatient Hospital Stay: Payer: Managed Care, Other (non HMO)

## 2021-06-03 VITALS — BP 113/81 | HR 93 | Temp 97.8°F | Resp 18

## 2021-06-03 DIAGNOSIS — Z170 Estrogen receptor positive status [ER+]: Secondary | ICD-10-CM

## 2021-06-03 DIAGNOSIS — Z5112 Encounter for antineoplastic immunotherapy: Secondary | ICD-10-CM | POA: Diagnosis present

## 2021-06-03 DIAGNOSIS — C50411 Malignant neoplasm of upper-outer quadrant of right female breast: Secondary | ICD-10-CM | POA: Diagnosis present

## 2021-06-03 DIAGNOSIS — Z95828 Presence of other vascular implants and grafts: Secondary | ICD-10-CM

## 2021-06-03 LAB — COMPREHENSIVE METABOLIC PANEL
ALT: 17 U/L (ref 0–44)
AST: 22 U/L (ref 15–41)
Albumin: 4 g/dL (ref 3.5–5.0)
Alkaline Phosphatase: 106 U/L (ref 38–126)
Anion gap: 7 (ref 5–15)
BUN: 6 mg/dL (ref 6–20)
CO2: 23 mmol/L (ref 22–32)
Calcium: 8.9 mg/dL (ref 8.9–10.3)
Chloride: 107 mmol/L (ref 98–111)
Creatinine, Ser: 0.87 mg/dL (ref 0.44–1.00)
GFR, Estimated: >60 mL/min (ref >60)
Glucose, Bld: 93 mg/dL (ref 70–99)
Potassium: 3.5 mmol/L (ref 3.5–5.1)
Sodium: 137 mmol/L (ref 135–145)
Total Bilirubin: 0.4 mg/dL (ref 0.3–1.2)
Total Protein: 7.2 g/dL (ref 6.5–8.1)

## 2021-06-03 LAB — CBC WITH DIFFERENTIAL/PLATELET
Abs Immature Granulocytes: 0.03 10*3/uL (ref 0.00–0.07)
Basophils Absolute: 0 10*3/uL (ref 0.0–0.1)
Basophils Relative: 0 %
Eosinophils Absolute: 0 10*3/uL (ref 0.0–0.5)
Eosinophils Relative: 1 %
HCT: 34.5 % — ABNORMAL LOW (ref 36.0–46.0)
Hemoglobin: 11.1 g/dL — ABNORMAL LOW (ref 12.0–15.0)
Immature Granulocytes: 1 %
Lymphocytes Relative: 15 %
Lymphs Abs: 0.9 10*3/uL (ref 0.7–4.0)
MCH: 26.3 pg (ref 26.0–34.0)
MCHC: 32.2 g/dL (ref 30.0–36.0)
MCV: 81.8 fL (ref 80.0–100.0)
Monocytes Absolute: 0.5 10*3/uL (ref 0.1–1.0)
Monocytes Relative: 8 %
Neutro Abs: 4.5 10*3/uL (ref 1.7–7.7)
Neutrophils Relative %: 75 %
Platelets: 249 10*3/uL (ref 150–400)
RBC: 4.22 MIL/uL (ref 3.87–5.11)
RDW: 13.4 % (ref 11.5–15.5)
WBC: 6 10*3/uL (ref 4.0–10.5)
nRBC: 0 % (ref 0.0–0.2)

## 2021-06-03 MED ORDER — SODIUM CHLORIDE 0.9 % IV SOLN: Freq: Once | INTRAVENOUS | Status: AC

## 2021-06-03 MED ORDER — HEPARIN SOD (PORK) LOCK FLUSH 100 UNIT/ML IV SOLN
500.0000 [IU] | Freq: Once | INTRAVENOUS | Status: AC | PRN
  Administered 2021-06-03: 500 [IU]

## 2021-06-03 MED ORDER — SODIUM CHLORIDE 0.9 % IV SOLN
420.0000 mg | Freq: Once | INTRAVENOUS | Status: AC
  Administered 2021-06-03: 420 mg via INTRAVENOUS
  Filled 2021-06-03: qty 14

## 2021-06-03 MED ORDER — SODIUM CHLORIDE 0.9% FLUSH
10.0000 mL | Freq: Once | INTRAVENOUS | Status: AC
  Administered 2021-06-03: 10 mL

## 2021-06-03 MED ORDER — SODIUM CHLORIDE 0.9% FLUSH
10.0000 mL | INTRAVENOUS | Status: DC | PRN
  Administered 2021-06-03: 10 mL

## 2021-06-03 MED ORDER — DIPHENHYDRAMINE HCL 25 MG PO CAPS
25.0000 mg | ORAL_CAPSULE | Freq: Once | ORAL | Status: AC
  Administered 2021-06-03: 25 mg via ORAL
  Filled 2021-06-03: qty 1

## 2021-06-03 MED ORDER — ACETAMINOPHEN 325 MG PO TABS
650.0000 mg | ORAL_TABLET | Freq: Once | ORAL | Status: AC
  Administered 2021-06-03: 650 mg via ORAL
  Filled 2021-06-03: qty 2

## 2021-06-03 MED ORDER — TRASTUZUMAB-ANNS CHEMO 150 MG IV SOLR
6.0000 mg/kg | Freq: Once | INTRAVENOUS | Status: AC
  Administered 2021-06-03: 399 mg via INTRAVENOUS
  Filled 2021-06-03: qty 19

## 2021-06-03 NOTE — Progress Notes (Signed)
Pt declined 30 minute observation period post Pertuzumab infusion. VSS. No complaints upon discharge.

## 2021-06-03 NOTE — Progress Notes (Signed)
Per MD request referral placed for [facility] nutritionist evaluation.

## 2021-06-06 ENCOUNTER — Other Ambulatory Visit: Payer: Self-pay

## 2021-06-06 ENCOUNTER — Ambulatory Visit: Payer: Managed Care, Other (non HMO) | Attending: General Surgery

## 2021-06-06 DIAGNOSIS — Z483 Aftercare following surgery for neoplasm: Secondary | ICD-10-CM

## 2021-06-06 NOTE — Therapy (Signed)
[facility] @ [address] [place], [place], [address] Phone: [phone]   Fax:  [phone]  Physical Therapy Treatment  Patient Details  Name: [name] MRN: [id number] Date of Birth: 03-26-1976 Referring Provider (PT): Dr. [name]   Encounter Date: 06/06/2021   PT End of Session - 06/06/21 1712     Visit Number 2   # unchanged due to screen only   PT Start Time 1533    PT Stop Time 1540    PT Time Calculation (min) 7 min    Activity Tolerance Patient tolerated treatment well    Behavior During Therapy [facility] for tasks assessed/performed             Past Medical History:  Diagnosis Date   Cancer ([place])    Colon stricture ([place]) 05/07/2019   Crohn's colitis, with intestinal obstruction ([place]) 09/17/2013   Dx 2006 - right and left colon involved 2011 - pan-colitis 09/17/2013 left colitis 60-20 cm March 2021 subtotal colectomy ileosigmoid anastomosis for colonic strictures-Dr. Marcello Moores     Crohn's disease [facility])    Family history of breast cancer 06/11/2020   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 10/17/2010   Rectal bleeding    Uterine fibroid    Vitamin D deficiency 09/24/2013    Past Surgical History:  Procedure Laterality Date   AXILLARY LYMPH NODE DISSECTION Right 11/09/2020   Procedure: RIGHT AXILLARY LYMPH NODE DISSECTION;  Surgeon: [name], MD;  Location: [place];  Service: General;  Laterality: Right;   BIOPSY  07/01/2020   Procedure: BIOPSY;  Surgeon: Yetta Flock, MD;  Location: [facility];  Service: Gastroenterology;;   BIOPSY  01/27/2021   Procedure: BIOPSY;  Surgeon: Yetta Flock, MD;  Location: [facility];  Service: Gastroenterology;;   BREAST RECONSTRUCTION WITH PLACEMENT OF TISSUE EXPANDER AND FLEX HD (ACELLULAR HYDRATED DERMIS) Bilateral 11/09/2020   Procedure: BILATERAL BREAST RECONSTRUCTION WITH PLACEMENT OF  TISSUE EXPANDER AND FLEX HD (ACELLULAR HYDRATED DERMIS);  Surgeon: [name], MD;  Location: [facility];  Service: Plastics;  Laterality: Bilateral;   COLECTOMY  06/27/2019   COLONOSCOPY  2015   FLEXIBLE SIGMOIDOSCOPY N/A 07/01/2020   Procedure: FLEXIBLE SIGMOIDOSCOPY;  Surgeon: Yetta Flock, MD;  Location: [facility];  Service: Gastroenterology;  Laterality: N/A;   FLEXIBLE SIGMOIDOSCOPY N/A 01/27/2021   Procedure: FLEXIBLE SIGMOIDOSCOPY;  Surgeon: Yetta Flock, MD;  Location: [facility];  Service: Gastroenterology;  Laterality: N/A;   NIPPLE SPARING MASTECTOMY Bilateral 11/09/2020   Procedure: BILATERAL NIPPLE SPARING MASTECTOMY;  Surgeon: [name], MD;  Location: [facility];  Service: General;  Laterality: Bilateral;   PORTACATH PLACEMENT N/A 06/23/2020   Procedure: INSERTION PORT-A-CATH;  Surgeon: [name], MD;  Location: [facility];  Service: General;  Laterality: N/A;  START TIME OF 11:00 AM FOR 60 MINUTES [place] [facility]    There were no vitals filed for this visit.           L-DEX FLOWSHEETS - 06/06/21 1700       L-DEX LYMPHEDEMA SCREENING   Measurement Type Unilateral    L-DEX MEASUREMENT EXTREMITY Upper Extremity    POSITION  Standing    DOMINANT SIDE Right    At Risk Side Right    BASELINE SCORE (UNILATERAL) -4.4    L-DEX SCORE (UNILATERAL) 6.8    VALUE CHANGE (UNILAT) 11.2  PT Long Term Goals - 12/06/20 1551       PT LONG TERM GOAL #1   Title Patient will demonstate she has regained full shoulder ROM and function post operatively compared to baselines.    Time 6    Period Months    Status Achieved                   Plan - 06/06/21 1713     Clinical Impression Statement Pt returns for her 3 month L-Dex screen. Her change from baseline of 11.2 indicates subclinical lymphedema so pt was measured for and  issued a size I [employer] compression sleeve and instructed in wash/care for sleeve, proper donning, wear of 10 hrs/day, do not wear at night, and how to order another from [email or web address] if she so wishes. Pt was able to verablize good understanding of all instructions and knows she can call this clinic if she has any questions before she returns in 1 month.    PT Next Visit Plan Pt to return in 1 month to reassess L-Dex score after having worn the compression sleeve daily for 30 days. If returns to [facility] then cont every 3 month L-Dex screen for up to 2 years from her ALND (~11/10/22)    Consulted and Agree with Plan of Care Patient             Patient will benefit from skilled therapeutic intervention in order to improve the following deficits and impairments:     Visit Diagnosis: Aftercare following surgery for neoplasm     Problem List Patient Active Problem List   Diagnosis Date Noted   Leucocytosis 05/27/2021   Crohn's disease of small and large intestines with complication ([place])    SBO (small bowel obstruction) ([place]) 01/26/2021   History of Crohn's disease    Breast cancer ([place]) 11/09/2020   Port-A-Cath in place 07/15/2020   Genetic testing 06/25/2020   Family history of breast cancer 06/11/2020   Malignant neoplasm of upper-outer quadrant of right breast in female, estrogen receptor positive ([place]) 06/04/2020   Abnormal finding present on diagnostic imaging of uterus 05/07/2019   Vitamin D deficiency 09/24/2013   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 10/17/2010    [name], PTA 06/06/2021, 5:17 PM  [facility] @ [address] [place], [place], [address] Phone: [phone]   Fax:  [phone]  Name: [name] MRN: [id number] Date of Birth: 01/17/1976

## 2021-06-07 ENCOUNTER — Telehealth: Payer: Self-pay | Admitting: Gastroenterology

## 2021-06-07 ENCOUNTER — Emergency Department (HOSPITAL_COMMUNITY): Payer: Managed Care, Other (non HMO)

## 2021-06-07 ENCOUNTER — Encounter (HOSPITAL_COMMUNITY): Payer: Self-pay

## 2021-06-07 ENCOUNTER — Inpatient Hospital Stay (HOSPITAL_COMMUNITY)
Admission: EM | Admit: 2021-06-07 | Discharge: 2021-06-13 | DRG: 389 | Disposition: A | Discharge status: Home / Self Care | Payer: Managed Care, Other (non HMO) | Attending: Family Medicine | Admitting: Family Medicine

## 2021-06-07 ENCOUNTER — Other Ambulatory Visit: Payer: Self-pay

## 2021-06-07 DIAGNOSIS — Z791 Long term (current) use of non-steroidal anti-inflammatories (NSAID): Secondary | ICD-10-CM

## 2021-06-07 DIAGNOSIS — Z87891 Personal history of nicotine dependence: Secondary | ICD-10-CM

## 2021-06-07 DIAGNOSIS — Z170 Estrogen receptor positive status [ER+]: Secondary | ICD-10-CM | POA: Diagnosis not present

## 2021-06-07 DIAGNOSIS — Z20822 Contact with and (suspected) exposure to covid-19: Secondary | ICD-10-CM | POA: Diagnosis present

## 2021-06-07 DIAGNOSIS — D72829 Elevated white blood cell count, unspecified: Secondary | ICD-10-CM | POA: Diagnosis present

## 2021-06-07 DIAGNOSIS — K5080 Crohn's disease of both small and large intestine without complications: Secondary | ICD-10-CM | POA: Diagnosis present

## 2021-06-07 DIAGNOSIS — E876 Hypokalemia: Secondary | ICD-10-CM

## 2021-06-07 DIAGNOSIS — Z886 Allergy status to analgesic agent status: Secondary | ICD-10-CM | POA: Diagnosis not present

## 2021-06-07 DIAGNOSIS — K56609 Unspecified intestinal obstruction, unspecified as to partial versus complete obstruction: Secondary | ICD-10-CM | POA: Diagnosis present

## 2021-06-07 DIAGNOSIS — Z803 Family history of malignant neoplasm of breast: Secondary | ICD-10-CM

## 2021-06-07 DIAGNOSIS — Z9013 Acquired absence of bilateral breasts and nipples: Secondary | ICD-10-CM

## 2021-06-07 DIAGNOSIS — D259 Leiomyoma of uterus, unspecified: Secondary | ICD-10-CM | POA: Diagnosis present

## 2021-06-07 DIAGNOSIS — E559 Vitamin D deficiency, unspecified: Secondary | ICD-10-CM | POA: Diagnosis present

## 2021-06-07 DIAGNOSIS — K56600 Partial intestinal obstruction, unspecified as to cause: Secondary | ICD-10-CM | POA: Diagnosis present

## 2021-06-07 DIAGNOSIS — K50819 Crohn's disease of both small and large intestine with unspecified complications: Secondary | ICD-10-CM | POA: Diagnosis not present

## 2021-06-07 DIAGNOSIS — Z9049 Acquired absence of other specified parts of digestive tract: Secondary | ICD-10-CM | POA: Diagnosis not present

## 2021-06-07 DIAGNOSIS — Z79899 Other long term (current) drug therapy: Secondary | ICD-10-CM | POA: Diagnosis not present

## 2021-06-07 DIAGNOSIS — R109 Unspecified abdominal pain: Secondary | ICD-10-CM | POA: Diagnosis present

## 2021-06-07 DIAGNOSIS — K50812 Crohn's disease of both small and large intestine with intestinal obstruction: Secondary | ICD-10-CM | POA: Diagnosis present

## 2021-06-07 DIAGNOSIS — C50411 Malignant neoplasm of upper-outer quadrant of right female breast: Secondary | ICD-10-CM

## 2021-06-07 LAB — CBC WITH DIFFERENTIAL/PLATELET
Abs Immature Granulocytes: 0.1 10*3/uL — ABNORMAL HIGH (ref 0.00–0.07)
Basophils Absolute: 0.1 10*3/uL (ref 0.0–0.1)
Basophils Relative: 0 %
Eosinophils Absolute: 0 10*3/uL (ref 0.0–0.5)
Eosinophils Relative: 0 %
HCT: 42.8 % (ref 36.0–46.0)
Hemoglobin: 14 g/dL (ref 12.0–15.0)
Immature Granulocytes: 1 %
Lymphocytes Relative: 5 %
Lymphs Abs: 1.1 10*3/uL (ref 0.7–4.0)
MCH: 26.7 pg (ref 26.0–34.0)
MCHC: 32.7 g/dL (ref 30.0–36.0)
MCV: 81.5 fL (ref 80.0–100.0)
Monocytes Absolute: 0.5 10*3/uL (ref 0.1–1.0)
Monocytes Relative: 2 %
Neutro Abs: 20.2 10*3/uL — ABNORMAL HIGH (ref 1.7–7.7)
Neutrophils Relative %: 92 %
Platelets: 343 10*3/uL (ref 150–400)
RBC: 5.25 MIL/uL — ABNORMAL HIGH (ref 3.87–5.11)
RDW: 13.9 % (ref 11.5–15.5)
WBC: 21.9 10*3/uL — ABNORMAL HIGH (ref 4.0–10.5)
nRBC: 0 % (ref 0.0–0.2)

## 2021-06-07 LAB — I-STAT CHEM 8, ED
BUN: 9 mg/dL (ref 6–20)
Calcium, Ion: 1.28 mmol/L (ref 1.15–1.40)
Chloride: 107 mmol/L (ref 98–111)
Creatinine, Ser: 0.7 mg/dL (ref 0.44–1.00)
Glucose, Bld: 159 mg/dL — ABNORMAL HIGH (ref 70–99)
HCT: 46 % (ref 36.0–46.0)
Hemoglobin: 15.6 g/dL — ABNORMAL HIGH (ref 12.0–15.0)
Potassium: 3.3 mmol/L — ABNORMAL LOW (ref 3.5–5.1)
Sodium: 140 mmol/L (ref 135–145)
TCO2: 20 mmol/L — ABNORMAL LOW (ref 22–32)

## 2021-06-07 LAB — COMPREHENSIVE METABOLIC PANEL
ALT: 22 U/L (ref 0–44)
AST: 24 U/L (ref 15–41)
Albumin: 4.5 g/dL (ref 3.5–5.0)
Alkaline Phosphatase: 129 U/L — ABNORMAL HIGH (ref 38–126)
Anion gap: 4 — ABNORMAL LOW (ref 5–15)
BUN: 11 mg/dL (ref 6–20)
CO2: 20 mmol/L — ABNORMAL LOW (ref 22–32)
Calcium: 9.4 mg/dL (ref 8.9–10.3)
Chloride: 113 mmol/L — ABNORMAL HIGH (ref 98–111)
Creatinine, Ser: 0.86 mg/dL (ref 0.44–1.00)
GFR, Estimated: >60 mL/min (ref >60)
Glucose, Bld: 159 mg/dL — ABNORMAL HIGH (ref 70–99)
Potassium: 3.2 mmol/L — ABNORMAL LOW (ref 3.5–5.1)
Sodium: 137 mmol/L (ref 135–145)
Total Bilirubin: 0.5 mg/dL (ref 0.3–1.2)
Total Protein: 8.8 g/dL — ABNORMAL HIGH (ref 6.5–8.1)

## 2021-06-07 LAB — RESP PANEL BY RT-PCR (FLU A&B, COVID) ARPGX2
Influenza A by PCR: NEGATIVE
Influenza B by PCR: NEGATIVE
SARS Coronavirus 2 by RT PCR: NEGATIVE

## 2021-06-07 LAB — LIPASE, BLOOD: Lipase: 34 U/L (ref 11–51)

## 2021-06-07 MED ORDER — ONDANSETRON HCL 4 MG/2ML IJ SOLN
4.0000 mg | Freq: Four times a day (QID) | INTRAMUSCULAR | Status: DC | PRN
  Administered 2021-06-07 – 2021-06-08 (×2): 4 mg via INTRAVENOUS
  Filled 2021-06-07 (×3): qty 2

## 2021-06-07 MED ORDER — KETAMINE HCL 50 MG/5ML IJ SOSY
10.0000 mg | PREFILLED_SYRINGE | Freq: Once | INTRAMUSCULAR | Status: DC
  Administered 2021-06-07: 10 mg via INTRAVENOUS
  Filled 2021-06-07: qty 5

## 2021-06-07 MED ORDER — ACETAMINOPHEN 650 MG RE SUPP: 650.0000 mg | Freq: Four times a day (QID) | RECTAL | Status: DC | PRN

## 2021-06-07 MED ORDER — IOHEXOL 300 MG/ML  SOLN
100.0000 mL | Freq: Once | INTRAMUSCULAR | Status: AC | PRN
  Administered 2021-06-07: 100 mL via INTRAVENOUS

## 2021-06-07 MED ORDER — DEXTROSE IN LACTATED RINGERS 5 % IV SOLN: INTRAVENOUS | Status: AC

## 2021-06-07 MED ORDER — SODIUM CHLORIDE 0.9 % IV BOLUS
1000.0000 mL | Freq: Once | INTRAVENOUS | Status: AC
  Administered 2021-06-07: 1000 mL via INTRAVENOUS

## 2021-06-07 MED ORDER — POTASSIUM CHLORIDE 10 MEQ/100ML IV SOLN: 10.0000 meq | INTRAVENOUS | Status: AC

## 2021-06-07 MED ORDER — MORPHINE SULFATE (PF) 2 MG/ML IV SOLN
1.0000 mg | INTRAVENOUS | Status: DC | PRN
  Administered 2021-06-07 – 2021-06-09 (×8): 1 mg via INTRAVENOUS
  Filled 2021-06-07 (×8): qty 1

## 2021-06-07 MED ORDER — ACETAMINOPHEN 325 MG PO TABS: 650.0000 mg | ORAL_TABLET | Freq: Four times a day (QID) | ORAL | Status: DC | PRN

## 2021-06-07 MED ORDER — HYDROMORPHONE HCL 1 MG/ML IJ SOLN
1.0000 mg | Freq: Once | INTRAMUSCULAR | Status: AC
  Administered 2021-06-07: 1 mg via INTRAVENOUS
  Filled 2021-06-07: qty 1

## 2021-06-07 MED ORDER — HEPARIN SODIUM (PORCINE) 5000 UNIT/ML IJ SOLN: 5000.0000 [IU] | Freq: Three times a day (TID) | INTRAMUSCULAR | Status: DC

## 2021-06-07 MED ORDER — ONDANSETRON HCL 4 MG/2ML IJ SOLN
4.0000 mg | Freq: Once | INTRAMUSCULAR | Status: AC
  Administered 2021-06-07: 4 mg via INTRAVENOUS
  Filled 2021-06-07: qty 2

## 2021-06-07 MED ORDER — LIDOCAINE HCL URETHRAL/MUCOSAL 2 % EX GEL
1.0000 "application " | Freq: Once | CUTANEOUS | Status: DC
  Administered 2021-06-07: 1 via TOPICAL
  Filled 2021-06-07: qty 11

## 2021-06-07 NOTE — Telephone Encounter (Signed)
Patient calling with persistent nausea and vomiting, no bowel movements and no flatus.  She was hospitalized with a small bowel obstruction last week.  Current symptoms are very similar.  She is advised to be n.p.o. and to go to the nearest ED for further evaluation this evening.

## 2021-06-07 NOTE — Progress Notes (Signed)
Patient ID: [name], female   DOB: [birth year], 46 y.o.   MRN: [id number] Patient known to our service, just discharged, will see in am, agree with ng decompression for now

## 2021-06-07 NOTE — ED Triage Notes (Signed)
Pt c/o abd pain, nausea, vomiting, hiccups.  Pt hx of bowel obstruction recently. Pt hx of beast cancer.

## 2021-06-07 NOTE — Telephone Encounter (Signed)
Patient called and said she was at the hospital due to a bowel obstruction and she is now starting to have similar symptoms again. Advised her to call later if it gets worst and ask for the D.O.D or go to ER.

## 2021-06-07 NOTE — H&P (Signed)
History and Physical    [name] KAJ:[id number] DOB: 12-01-1975 DOA: 06/07/2021  PCP: Pcp, No  Patient coming from: Home.  Chief Complaint: Abdominal pain with nausea vomiting.  HPI: [name] is a 46 y.o. female with history of Crohn's disease status post colectomy with ileorectal anastomosis in 2021 who was recently admitted for small bowel obstruction and discharged about a week ago started developing abdominal pain with nausea vomiting again over the last 24 hours.  Last bowel movement was 24 hours ago.  Denies any blood in the vomitus.  Pain in the abdomen is diffuse.  ED Course: In the ER CT abdomen pelvis shows high-grade small bowel obstruction with transition point in the ileosigmoid anastomotic area.  [name], general surgeon was consulted.  NG tube placed patient admitted for further work-up.  Labs do show leukocytosis but patient does not have any signs of infection.  COVID test negative.  Review of Systems: As per HPI, rest all negative.   Past Medical History:  Diagnosis Date   Cancer [facility])    Colon stricture ([place]) 05/07/2019   Crohn's colitis, with intestinal obstruction ([place]) 09/17/2013   Dx 2006 - right and left colon involved 2011 - pan-colitis 09/17/2013 left colitis 60-20 cm March 2021 subtotal colectomy ileosigmoid anastomosis for colonic strictures-Dr. Marcello Moores     Crohn's disease [facility])    Family history of breast cancer 06/11/2020   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 10/17/2010   Rectal bleeding    Uterine fibroid    Vitamin D deficiency 09/24/2013    Past Surgical History:  Procedure Laterality Date   AXILLARY LYMPH NODE DISSECTION Right 11/09/2020   Procedure: RIGHT AXILLARY LYMPH NODE DISSECTION;  Surgeon: [name], MD;  Location: [place];  Service: General;  Laterality: Right;   BIOPSY  07/01/2020   Procedure: BIOPSY;  Surgeon: Yetta Flock, MD;  Location[facility]  END[facility];  Service: Gastroenterology;;   BIOPSY  01/27/2021   Procedure: BIOPSY;  Surgeon: Yetta Flock, MD;  [facility];  Service: Gastroenterology;;   BREAST RECONSTRUCTION WITH PLACEMENT OF TISSUE EXPANDER AND FLEX HD (ACELLULAR HYDRATED DERMIS) Bilateral 11/09/2020   Procedure: BILATERAL BREAST RECONSTRUCTION WITH PLACEMENT OF TISSUE EXPANDER AND FLEX HD (ACELLULAR HYDRATED DERMIS);  Surgeon: [name], MD;  Location: [place];  Service: Plastics;  Laterality: Bilateral;   COLECTOMY  06/27/2019   COLONOSCOPY  2015   FLEXIBLE SIGMOIDOSCOPY N/A 07/01/2020   Procedure: FLEXIBLE SIGMOIDOSCOPY;  Surgeon: Yetta Flock, MD;  [facility];  Service: Gastroenterology;  Laterality: N/A;   FLEXIBLE SIGMOIDOSCOPY N/A 01/27/2021   Procedure: FLEXIBLE SIGMOIDOSCOPY;  Surgeon: Yetta Flock, MD;  [facility];  Service: Gastroenterology;  Laterality: N/A;   NIPPLE SPARING MASTECTOMY Bilateral 11/09/2020   Procedure: BILATERAL NIPPLE SPARING MASTECTOMY;  Surgeon: [name], MD;  Location: [place];  Service: General;  Laterality: Bilateral;   PORTACATH PLACEMENT N/A 06/23/2020   Procedure: INSERTION PORT-A-CATH;  Surgeon: [name], MD;  Location: [place];  Service: General;  Laterality: N/A;  START TIME OF 11:00 AM FOR 60 MINUTES [place] [facility]     reports that she has quit smoking. Her smoking use included cigarettes. She has never used smokeless tobacco. She reports current alcohol use of about 1.0 - 2.0 standard drink per week. She reports that she does not use drugs.  Allergies  Allergen Reactions   Nsaids Other (See Comments)  Crohns disease/ IBD   Other     BLOOD PRODUCT REFUSAL    Family History  Problem Relation Age of Onset   Breast cancer Maternal Aunt        dx unknown age   Diabetes Father    Diabetes Paternal Aunt    Breast cancer Cousin        maternal  cousin; dx unknown age   Esophageal cancer Neg Hx    Rectal cancer Neg Hx    Stomach cancer Neg Hx    Colon polyps Neg Hx    Colon cancer Neg Hx     Prior to Admission medications   Medication Sig Start Date End Date Taking? Authorizing Provider  acetaminophen (TYLENOL) 500 MG tablet Take 500-1,000 mg by mouth every 6 (six) hours as needed for mild pain.    [provider]  lidocaine-prilocaine (EMLA) cream Apply 1 application topically as needed (Prior to port access). 06/16/20   Benay Pike, MD  mesalamine (LIALDA) 1.2 g EC tablet Take 2 tablets (2.4 g total) by mouth 2 (two) times daily with a meal. 07/22/20   [name], MD  Multiple Vitamins-Minerals (MULTIVITAMIN WITH MINERALS) tablet Take 1 tablet by mouth daily.    [provider]  polyethylene glycol (MIRALAX) 17 g packet Take 17 g by mouth daily as needed. 01/28/21   Maczis, Barth Kirks, PA-C  prochlorperazine (COMPAZINE) 10 MG tablet Take 1 tablet (10 mg total) by mouth every 6 (six) hours as needed (Nausea or vomiting). 06/16/20 11/19/20  Benay Pike, MD    Physical Exam: Constitutional: Moderately built and nourished. Vitals:   06/07/21 1814 06/07/21 1823 06/07/21 1912 06/07/21 1930  BP:  (!) 160/112 129/90 131/90  Pulse:  (!) 125 98 98  Resp:  (!) 23 15 14   Temp:  98.6 F (37 C)    TempSrc:  Oral    SpO2:  97% 96% 96%  Weight: 63 kg     Height: 5' 4"  (1.626 m)      Eyes: Anicteric no pallor. ENMT: No discharge from the ears eyes nose and mouth. Neck: No mass felt.  No neck rigidity. Respiratory: No rhonchi or crepitations. Cardiovascular: S1-S2 heard. Abdomen: Distended nontender bowel sounds not appreciated. Musculoskeletal: No edema. Skin: No rash. Neurologic: Alert awake oriented to time place and person.  Moves all extremities. Psychiatric: Appears normal.  Normal affect.   Labs on Admission: I have personally reviewed following labs and imaging studies  CBC: Recent Labs  Lab  06/03/21 0927 06/07/21 1818 06/07/21 1835  WBC 6.0 21.9*  --   NEUTROABS 4.5 20.2*  --   HGB 11.1* 14.0 15.6*  HCT 34.5* 42.8 46.0  MCV 81.8 81.5  --   PLT 249 343  --    Basic Metabolic Panel: Recent Labs  Lab 06/03/21 0927 06/07/21 1818 06/07/21 1835  NA 137 137 140  K 3.5 3.2* 3.3*  CL 107 113* 107  CO2 23 20*  --   GLUCOSE 93 159* 159*  BUN 6 11 9   CREATININE 0.87 0.86 0.70  CALCIUM 8.9 9.4  --    GFR: Estimated Creatinine Clearance: 78.3 mL/min (by C-G formula based on SCr of 0.7 mg/dL). Liver Function Tests: Recent Labs  Lab 06/03/21 0927 06/07/21 1818  AST 22 24  ALT 17 22  ALKPHOS 106 129*  BILITOT 0.4 0.5  PROT 7.2 8.8*  ALBUMIN 4.0 4.5   Recent Labs  Lab 06/07/21 1818  LIPASE 34   No  results for input(s): AMMONIA in the last 168 hours. Coagulation Profile: No results for input(s): INR, PROTIME in the last 168 hours. Cardiac Enzymes: No results for input(s): CKTOTAL, CKMB, CKMBINDEX, TROPONINI in the last 168 hours. BNP (last 3 results) No results for input(s): PROBNP in the last 8760 hours. HbA1C: No results for input(s): HGBA1C in the last 72 hours. CBG: No results for input(s): GLUCAP in the last 168 hours. Lipid Profile: No results for input(s): CHOL, HDL, LDLCALC, TRIG, CHOLHDL, LDLDIRECT in the last 72 hours. Thyroid Function Tests: No results for input(s): TSH, T4TOTAL, FREET4, T3FREE, THYROIDAB in the last 72 hours. Anemia Panel: No results for input(s): VITAMINB12, FOLATE, FERRITIN, TIBC, IRON, RETICCTPCT in the last 72 hours. Urine analysis:    Component Value Date/Time   COLORURINE YELLOW 01/26/2021 0740   APPEARANCEUR HAZY (A) 01/26/2021 0740   LABSPEC 1.030 01/26/2021 0740   PHURINE 5.0 01/26/2021 0740   GLUCOSEU NEGATIVE 01/26/2021 0740   HGBUR NEGATIVE 01/26/2021 0740   BILIRUBINUR NEGATIVE 01/26/2021 0740   KETONESUR NEGATIVE 01/26/2021 0740   PROTEINUR 30 (A) 01/26/2021 0740   UROBILINOGEN 0.2 06/24/2007 0321    NITRITE NEGATIVE 01/26/2021 0740   LEUKOCYTESUR NEGATIVE 01/26/2021 0740   Sepsis Labs: @LABRCNTIP (procalcitonin:4,lacticidven:4) )No results found for this or any previous visit (from the past 240 hour(s)).   Radiological Exams on Admission: CT ABDOMEN PELVIS W CONTRAST  Result Date: 06/07/2021 CLINICAL DATA:  Abdominal pain, nausea and vomiting, hiccups EXAM: CT ABDOMEN AND PELVIS WITH CONTRAST TECHNIQUE: Multidetector CT imaging of the abdomen and pelvis was performed using the standard protocol following bolus administration of intravenous contrast. RADIATION DOSE REDUCTION: This exam was performed according to the departmental dose-optimization program which includes automated exposure control, adjustment of the mA and/or kV according to patient size and/or use of iterative reconstruction technique. CONTRAST:  172m OMNIPAQUE IOHEXOL 300 MG/ML  SOLN COMPARISON:  05/27/2021 FINDINGS: Lower chest: No acute pleural or parenchymal lung disease. Hepatobiliary: No focal liver abnormality is seen. No gallstones, gallbladder wall thickening, or biliary dilatation. Pancreas: Unremarkable. No pancreatic ductal dilatation or surrounding inflammatory changes. Spleen: Normal in size without focal abnormality. Adrenals/Urinary Tract: Adrenal glands are unremarkable. Kidneys are normal, without renal calculi, focal lesion, or hydronephrosis. Bladder is decompressed, limiting its evaluation. Stomach/Bowel: Postsurgical changes from subtotal colectomy. There is progressive small-bowel obstruction with abrupt transition at the ileo sigmoid anastomosis within the central upper pelvis. Increasing caliber of the dilated small bowel measuring up to 5 cm, with diffuse gas fluid levels. Vascular/Lymphatic: No significant vascular findings are present. No enlarged abdominal or pelvic lymph nodes. Reproductive: A large heterogeneous uterus consistent with fibroids unchanged. No adnexal masses. Other: No free fluid or free  intraperitoneal gas. No abdominal wall hernia. Musculoskeletal: Bilateral breast prostheses. No acute or destructive bony lesions. Reconstructed images demonstrate no additional findings. IMPRESSION: 1. Progressive high-grade small bowel obstruction, with abrupt transition point at the site of prior ileo sigmoid anastomosis within the central upper pelvis. Increasing caliber of the small bowel with diffuse gas fluid levels again noted. 2. Fibroid uterus. Electronically Signed   By: MRanda NgoM.D.   On: 06/07/2021 19:32      Assessment/Plan Principal Problem:   SBO (small bowel obstruction) (HCC) Active Problems:   Malignant neoplasm of upper-outer quadrant of right breast in female, estrogen receptor positive ([place]   Crohn's disease of small and large intestines with complication [place]   Leucocytosis    Small bowel obstruction -with history of Crohn's disease status post colectomy  with ileorectal anastomosis.  Patient will be kept n.p.o. and an NG tube suction.  IV fluids pain medication General surgery has been consulted will follow the recommendations. Leukocytosis could be reactive.  No signs of infection we will continue to follow CBC. History of breast cancer undergoing chemotherapy. History of anemia follow CBC.  Since patient has bowel obstruction will need close monitoring and further management and inpatient status.   DVT prophylaxis: Heparin. Code Status: Full code. Family Communication: Discussed with patient. Disposition Plan: Home. Consults called: General surgery. Admission status: Inpatient.   Rise Patience MD [facility] Pager [phone].  If 7PM-7AM, please contact night-coverage [email or web address] Password [facility]  06/07/2021, 8:25 PM

## 2021-06-07 NOTE — ED Provider Notes (Signed)
[name] Provider Note   CSN: [id number] Arrival date & time: 06/07/21  1808     History  Chief Complaint  Patient presents with   Abdominal Pain   Emesis    [name] is a 46 y.o. female.  46 yo F with a chief complaints of nausea vomiting and no flatus or stool output since this morning.  She has a history of Crohn's disease as well as breast cancer with mets.  Was just in the hospital a couple weeks ago with similar symptoms and found to have small bowel obstruction.  She thinks her symptoms feel very similar.  No fevers or chills.  Diffuse abdominal discomfort.   Abdominal Pain Associated symptoms: vomiting   Emesis Associated symptoms: abdominal pain       Home Medications Prior to Admission medications   Medication Sig Start Date End Date Taking? Authorizing Provider  acetaminophen (TYLENOL) 500 MG tablet Take 500-1,000 mg by mouth every 6 (six) hours as needed for mild pain.    [provider]  lidocaine-prilocaine (EMLA) cream Apply 1 application topically as needed (Prior to port access). 06/16/20   Benay Pike, MD  mesalamine (LIALDA) 1.2 g EC tablet Take 2 tablets (2.4 g total) by mouth 2 (two) times daily with a meal. 07/22/20   [name], MD  Multiple Vitamins-Minerals (MULTIVITAMIN WITH MINERALS) tablet Take 1 tablet by mouth daily.    [provider]  polyethylene glycol (MIRALAX) 17 g packet Take 17 g by mouth daily as needed. 01/28/21   Maczis, Barth Kirks, PA-C  prochlorperazine (COMPAZINE) 10 MG tablet Take 1 tablet (10 mg total) by mouth every 6 (six) hours as needed (Nausea or vomiting). 06/16/20 11/19/20  Benay Pike, MD      Allergies    Nsaids and Other    Review of Systems   Review of Systems  Gastrointestinal:  Positive for abdominal pain and vomiting.   Physical Exam Updated Vital Signs BP 131/90    Pulse 98    Temp 98.6 F (37 C) (Oral)    Resp 14    Ht 5' 4"  (1.626 m)     Wt 63 kg    SpO2 96%    BMI 23.84 kg/m  Physical Exam Vitals and nursing note reviewed.  Constitutional:      General: She is not in acute distress.    Appearance: She is well-developed. She is not diaphoretic.  HENT:     Head: Normocephalic and atraumatic.  Eyes:     Pupils: Pupils are equal, round, and reactive to light.  Cardiovascular:     Rate and Rhythm: Normal rate and regular rhythm.     Heart sounds: No murmur heard.   No friction rub. No gallop.  Pulmonary:     Effort: Pulmonary effort is normal.     Breath sounds: No wheezing or rales.  Abdominal:     General: There is no distension.     Palpations: Abdomen is soft.     Tenderness: There is abdominal tenderness (diffuse).  Musculoskeletal:        General: No tenderness.     Cervical back: Normal range of motion and neck supple.  Skin:    General: Skin is warm and dry.  Neurological:     Mental Status: She is alert and oriented to person, place, and time.  Psychiatric:        Behavior: Behavior normal.    ED Results / Procedures /  Treatments   Labs (all labs ordered are listed, but only abnormal results are displayed) Labs Reviewed  CBC WITH DIFFERENTIAL/PLATELET - Abnormal; Notable for the following components:      Result Value   WBC 21.9 (*)    RBC 5.25 (*)    Neutro Abs 20.2 (*)    Abs Immature Granulocytes 0.10 (*)    All other components within normal limits  COMPREHENSIVE METABOLIC PANEL - Abnormal; Notable for the following components:   Potassium 3.2 (*)    Chloride 113 (*)    CO2 20 (*)    Glucose, Bld 159 (*)    Total Protein 8.8 (*)    Alkaline Phosphatase 129 (*)    Anion gap 4 (*)    All other components within normal limits  I-STAT CHEM 8, ED - Abnormal; Notable for the following components:   Potassium 3.3 (*)    Glucose, Bld 159 (*)    TCO2 20 (*)    Hemoglobin 15.6 (*)    All other components within normal limits  RESP PANEL BY RT-PCR (FLU A&B, COVID) ARPGX2  LIPASE, BLOOD   COMPREHENSIVE METABOLIC PANEL  CBC    EKG None  Radiology CT ABDOMEN PELVIS W CONTRAST  Result Date: 06/07/2021 CLINICAL DATA:  Abdominal pain, nausea and vomiting, hiccups EXAM: CT ABDOMEN AND PELVIS WITH CONTRAST TECHNIQUE: Multidetector CT imaging of the abdomen and pelvis was performed using the standard protocol following bolus administration of intravenous contrast. RADIATION DOSE REDUCTION: This exam was performed according to the departmental dose-optimization program which includes automated exposure control, adjustment of the mA and/or kV according to patient size and/or use of iterative reconstruction technique. CONTRAST:  149m OMNIPAQUE IOHEXOL 300 MG/ML  SOLN COMPARISON:  05/27/2021 FINDINGS: Lower chest: No acute pleural or parenchymal lung disease. Hepatobiliary: No focal liver abnormality is seen. No gallstones, gallbladder wall thickening, or biliary dilatation. Pancreas: Unremarkable. No pancreatic ductal dilatation or surrounding inflammatory changes. Spleen: Normal in size without focal abnormality. Adrenals/Urinary Tract: Adrenal glands are unremarkable. Kidneys are normal, without renal calculi, focal lesion, or hydronephrosis. Bladder is decompressed, limiting its evaluation. Stomach/Bowel: Postsurgical changes from subtotal colectomy. There is progressive small-bowel obstruction with abrupt transition at the ileo sigmoid anastomosis within the central upper pelvis. Increasing caliber of the dilated small bowel measuring up to 5 cm, with diffuse gas fluid levels. Vascular/Lymphatic: No significant vascular findings are present. No enlarged abdominal or pelvic lymph nodes. Reproductive: A large heterogeneous uterus consistent with fibroids unchanged. No adnexal masses. Other: No free fluid or free intraperitoneal gas. No abdominal wall hernia. Musculoskeletal: Bilateral breast prostheses. No acute or destructive bony lesions. Reconstructed images demonstrate no additional findings.  IMPRESSION: 1. Progressive high-grade small bowel obstruction, with abrupt transition point at the site of prior ileo sigmoid anastomosis within the central upper pelvis. Increasing caliber of the small bowel with diffuse gas fluid levels again noted. 2. Fibroid uterus. Electronically Signed   By: MRanda NgoM.D.   On: 06/07/2021 19:32    Procedures Procedures    Medications Ordered in ED Medications  heparin injection 5,000 Units (has no administration in time range)  acetaminophen (TYLENOL) tablet 650 mg (has no administration in time range)    Or  acetaminophen (TYLENOL) suppository 650 mg (has no administration in time range)  morphine (PF) 2 MG/ML injection 1 mg (has no administration in time range)  dextrose 5 % in lactated ringers infusion (has no administration in time range)  potassium chloride 10 mEq in 100 mL  IVPB (has no administration in time range)  sodium chloride 0.9 % bolus 1,000 mL (0 mLs Intravenous Stopped 06/07/21 2034)  HYDROmorphone (DILAUDID) injection 1 mg (1 mg Intravenous Given 06/07/21 1829)  ondansetron (ZOFRAN) injection 4 mg (4 mg Intravenous Given 06/07/21 1828)  iohexol (OMNIPAQUE) 300 MG/ML solution 100 mL (100 mLs Intravenous Contrast Given 06/07/21 1851)    ED Course/ Medical Decision Making/ A&P                           Medical Decision Making Amount and/or Complexity of Data Reviewed Labs: ordered. Radiology: ordered.  Risk Prescription drug management. Decision regarding hospitalization.   Patient is a 46 y.o. female with a cc of abdominal pain nausea and vomiting.  I actually saw the patient's last time she presented with similar symptoms which was about a week ago she was admitted and per record review resolved on her own.  I suspect likely this is a recurrence.  We will obtain a laboratory evaluation and CT scan and treat pain and nausea and reassess..  Patient's potassium is mildly low at 3.2.  No LFT elevation.  Lipase is normal.   Leukocytosis of 20,000.  CT scan of the abdomen pelvis with concern for high-grade small bowel obstruction.  I discussed this with [name].  Recommends NG tube hospitalist admission and surgery will see in the hospital.  The patients results and plan were reviewed and discussed.   Any x-rays performed were independently reviewed by myself.   Differential diagnosis were considered with the presenting HPI.  Medications  heparin injection 5,000 Units (has no administration in time range)  acetaminophen (TYLENOL) tablet 650 mg (has no administration in time range)    Or  acetaminophen (TYLENOL) suppository 650 mg (has no administration in time range)  morphine (PF) 2 MG/ML injection 1 mg (has no administration in time range)  dextrose 5 % in lactated ringers infusion (has no administration in time range)  potassium chloride 10 mEq in 100 mL IVPB (has no administration in time range)  sodium chloride 0.9 % bolus 1,000 mL (0 mLs Intravenous Stopped 06/07/21 2034)  HYDROmorphone (DILAUDID) injection 1 mg (1 mg Intravenous Given 06/07/21 1829)  ondansetron (ZOFRAN) injection 4 mg (4 mg Intravenous Given 06/07/21 1828)  iohexol (OMNIPAQUE) 300 MG/ML solution 100 mL (100 mLs Intravenous Contrast Given 06/07/21 1851)    Vitals:   06/07/21 1814 06/07/21 1823 06/07/21 1912 06/07/21 1930  BP:  (!) 160/112 129/90 131/90  Pulse:  (!) 125 98 98  Resp:  (!) 23 15 14   Temp:  98.6 F (37 C)    TempSrc:  Oral    SpO2:  97% 96% 96%  Weight: 63 kg     Height: 5' 4"  (1.626 m)       Final diagnoses:  SBO (small bowel obstruction) ([place])    Admission/ observation were discussed with the admitting physician, patient and/or family and they are comfortable with the plan.           Final Clinical Impression(s) / ED Diagnoses Final diagnoses:  SBO (small bowel obstruction) [facility])    Rx / DC Orders ED Discharge Orders     None         Deno Etienne, DO 06/07/21 2044

## 2021-06-07 NOTE — ED Notes (Signed)
ED TO INPATIENT HANDOFF REPORT  ED Nurse Name and Phone #: Erick Colace, [phone]  S Name/Age/Gender [name] 46 y.o. female Room/Bed: WA17/WA17  Code Status   Code Status: Full Code  Home/SNF/Other Home Patient oriented to: self, place, time, and situation Is this baseline? Yes   Triage Complete: Triage complete  Chief Complaint SBO (small bowel obstruction) ([place]) [G81.856]  Triage Note Pt c/o abd pain, nausea, vomiting, hiccups.  Pt hx of bowel obstruction recently. Pt hx of beast cancer.   Allergies Allergies  Allergen Reactions   Nsaids Other (See Comments)    Crohns disease/ IBD   Other     BLOOD PRODUCT REFUSAL    Level of Care/Admitting Diagnosis ED Disposition     ED Disposition  Admit   Condition  --   Comment  Hospital Area: [place] [[id number]]  Level of Care: Med-Surg [16]  May admit patient to [facility] or [facility] if equivalent level of care is available:: No  Covid Evaluation: Asymptomatic Screening Protocol (No Symptoms)  Diagnosis: SBO (small bowel obstruction) [facility]) [[id number]]  Admitting Physician: Rise Patience [phone]  Attending Physician: Rise Patience [phone]  Estimated length of stay: past midnight tomorrow  Certification:: I certify this patient will need inpatient services for at least 2 midnights          B Medical/Surgery History Past Medical History:  Diagnosis Date   Cancer ([place])    Colon stricture ([place]) 05/07/2019   Crohn's colitis, with intestinal obstruction ([place]) 09/17/2013   Dx 2006 - right and left colon involved 2011 - pan-colitis 09/17/2013 left colitis 60-20 cm March 2021 subtotal colectomy ileosigmoid anastomosis for colonic strictures-Dr. Marcello Moores     Crohn's disease [facility])    Family history of breast cancer 06/11/2020   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 10/17/2010   Rectal bleeding    Uterine fibroid    Vitamin D deficiency  09/24/2013   Past Surgical History:  Procedure Laterality Date   AXILLARY LYMPH NODE DISSECTION Right 11/09/2020   Procedure: RIGHT AXILLARY LYMPH NODE DISSECTION;  Surgeon: [name], MD;  Location: [place];  Service: General;  Laterality: Right;   BIOPSY  07/01/2020   Procedure: BIOPSY;  Surgeon: Yetta Flock, MD;  Location: [facility];  Service: Gastroenterology;;   BIOPSY  01/27/2021   Procedure: BIOPSY;  Surgeon: Yetta Flock, MD;  [facility];  Service: Gastroenterology;;   BREAST RECONSTRUCTION WITH PLACEMENT OF TISSUE EXPANDER AND FLEX HD (ACELLULAR HYDRATED DERMIS) Bilateral 11/09/2020   Procedure: BILATERAL BREAST RECONSTRUCTION WITH PLACEMENT OF TISSUE EXPANDER AND FLEX HD (ACELLULAR HYDRATED DERMIS);  Surgeon: [name], MD;  Location: [place];  Service: Plastics;  Laterality: Bilateral;   COLECTOMY  06/27/2019   COLONOSCOPY  2015   FLEXIBLE SIGMOIDOSCOPY N/A 07/01/2020   Procedure: FLEXIBLE SIGMOIDOSCOPY;  Surgeon: Yetta Flock, MD;  [facility];  Service: Gastroenterology;  Laterality: N/A;   FLEXIBLE SIGMOIDOSCOPY N/A 01/27/2021   Procedure: FLEXIBLE SIGMOIDOSCOPY;  Surgeon: Yetta Flock, MD;  [facility];  Service: Gastroenterology;  Laterality: N/A;   NIPPLE SPARING MASTECTOMY Bilateral 11/09/2020   Procedure: BILATERAL NIPPLE SPARING MASTECTOMY;  Surgeon: [name], MD;  Location: [place];  Service: General;  Laterality: Bilateral;   PORTACATH PLACEMENT N/A 06/23/2020   Procedure: INSERTION PORT-A-CATH;  Surgeon: [name], MD;  Location: [place];  Service: General;  Laterality: N/A;  START TIME OF 11:00 AM FOR 60 MINUTES [place] [facility]     A IV Location/Drains/Wounds Patient Lines/Drains/Airways Status     Active Line/Drains/Airways     Name Placement date Placement time Site Days   Implanted Port 06/30/20  Left Chest 06/30/20  0206  Chest  342   Peripheral IV 06/07/21 20 G Left Antecubital 06/07/21  1828  Antecubital  less than 1   NG/OG Vented/Dual Lumen 14 Fr. Right nare Marking at nare/corner of mouth 06/07/21  2035  Right nare  less than 1            Intake/Output Last 24 hours  Intake/Output Summary (Last 24 hours) at 06/07/2021 2036 Last data filed at 06/07/2021 2034 Gross per 24 hour  Intake 1000 ml  Output --  Net 1000 ml    Labs/Imaging Results for orders placed or performed during the hospital encounter of 06/07/21 (from the past 48 hour(s))  CBC with Differential     Status: Abnormal   Collection Time: 06/07/21  6:18 PM  Result Value Ref Range   WBC 21.9 (H) 4.0 - 10.5 K/uL   RBC 5.25 (H) 3.87 - 5.11 MIL/uL   Hemoglobin 14.0 12.0 - 15.0 g/dL   HCT 42.8 36.0 - 46.0 %   MCV 81.5 80.0 - 100.0 fL   MCH 26.7 26.0 - 34.0 pg   MCHC 32.7 30.0 - 36.0 g/dL   RDW 13.9 11.5 - 15.5 %   Platelets 343 150 - 400 K/uL   nRBC 0.0 0.0 - 0.2 %   Neutrophils Relative % 92 %   Neutro Abs 20.2 (H) 1.7 - 7.7 K/uL   Lymphocytes Relative 5 %   Lymphs Abs 1.1 0.7 - 4.0 K/uL   Monocytes Relative 2 %   Monocytes Absolute 0.5 0.1 - 1.0 K/uL   Eosinophils Relative 0 %   Eosinophils Absolute 0.0 0.0 - 0.5 K/uL   Basophils Relative 0 %   Basophils Absolute 0.1 0.0 - 0.1 K/uL   Immature Granulocytes 1 %   Abs Immature Granulocytes 0.10 (H) 0.00 - 0.07 K/uL    Comment: Performed at [facility], [place] [address], [place], [place] [address]  Comprehensive metabolic panel     Status: Abnormal   Collection Time: 06/07/21  6:18 PM  Result Value Ref Range   Sodium 137 135 - 145 mmol/L   Potassium 3.2 (L) 3.5 - 5.1 mmol/L   Chloride 113 (H) 98 - 111 mmol/L   CO2 20 (L) 22 - 32 mmol/L   Glucose, Bld 159 (H) 70 - 99 mg/dL    Comment: Glucose reference range applies only to samples taken after fasting for at least 8 hours.   BUN 11 6 - 20 mg/dL   Creatinine, Ser 0.86 0.44 - 1.00  mg/dL   Calcium 9.4 8.9 - 10.3 mg/dL   Total Protein 8.8 (H) 6.5 - 8.1 g/dL   Albumin 4.5 3.5 - 5.0 g/dL   AST 24 15 - 41 U/L   ALT 22 0 - 44 U/L   Alkaline Phosphatase 129 (H) 38 - 126 U/L   Total Bilirubin 0.5 0.3 - 1.2 mg/dL   GFR, Estimated >60 >60 mL/min    Comment: (NOTE) Calculated using the CKD-EPI Creatinine Equation (2021)    Anion gap 4 (L) 5 - 15    Comment: Performed at [facility], [place] [address], [place], [place] [address]  Lipase, blood     Status: None   Collection  Time: 06/07/21  6:18 PM  Result Value Ref Range   Lipase 34 11 - 51 U/L    Comment: Performed at [facility], [place] [address] [place], [place] [address]  I-stat chem 8, ED (not at [facility] or [facility])     Status: Abnormal   Collection Time: 06/07/21  6:35 PM  Result Value Ref Range   Sodium 140 135 - 145 mmol/L   Potassium 3.3 (L) 3.5 - 5.1 mmol/L   Chloride 107 98 - 111 mmol/L   BUN 9 6 - 20 mg/dL   Creatinine, Ser 0.70 0.44 - 1.00 mg/dL   Glucose, Bld 159 (H) 70 - 99 mg/dL    Comment: Glucose reference range applies only to samples taken after fasting for at least 8 hours.   Calcium, Ion 1.28 1.15 - 1.40 mmol/L   TCO2 20 (L) 22 - 32 mmol/L   Hemoglobin 15.6 (H) 12.0 - 15.0 g/dL   HCT 46.0 36.0 - 46.0 %   CT ABDOMEN PELVIS W CONTRAST  Result Date: 06/07/2021 CLINICAL DATA:  Abdominal pain, nausea and vomiting, hiccups EXAM: CT ABDOMEN AND PELVIS WITH CONTRAST TECHNIQUE: Multidetector CT imaging of the abdomen and pelvis was performed using the standard protocol following bolus administration of intravenous contrast. RADIATION DOSE REDUCTION: This exam was performed according to the departmental dose-optimization program which includes automated exposure control, adjustment of the mA and/or kV according to patient size and/or use of iterative reconstruction technique. CONTRAST:  147m OMNIPAQUE IOHEXOL 300 MG/ML  SOLN COMPARISON:  05/27/2021 FINDINGS: Lower chest: No  acute pleural or parenchymal lung disease. Hepatobiliary: No focal liver abnormality is seen. No gallstones, gallbladder wall thickening, or biliary dilatation. Pancreas: Unremarkable. No pancreatic ductal dilatation or surrounding inflammatory changes. Spleen: Normal in size without focal abnormality. Adrenals/Urinary Tract: Adrenal glands are unremarkable. Kidneys are normal, without renal calculi, focal lesion, or hydronephrosis. Bladder is decompressed, limiting its evaluation. Stomach/Bowel: Postsurgical changes from subtotal colectomy. There is progressive small-bowel obstruction with abrupt transition at the ileo sigmoid anastomosis within the central upper pelvis. Increasing caliber of the dilated small bowel measuring up to 5 cm, with diffuse gas fluid levels. Vascular/Lymphatic: No significant vascular findings are present. No enlarged abdominal or pelvic lymph nodes. Reproductive: A large heterogeneous uterus consistent with fibroids unchanged. No adnexal masses. Other: No free fluid or free intraperitoneal gas. No abdominal wall hernia. Musculoskeletal: Bilateral breast prostheses. No acute or destructive bony lesions. Reconstructed images demonstrate no additional findings. IMPRESSION: 1. Progressive high-grade small bowel obstruction, with abrupt transition point at the site of prior ileo sigmoid anastomosis within the central upper pelvis. Increasing caliber of the small bowel with diffuse gas fluid levels again noted. 2. Fibroid uterus. Electronically Signed   By: MRanda NgoM.D.   On: 06/07/2021 19:32    Pending Labs Unresulted Labs (From admission, onward)     Start     Ordered   06/08/21 0500  Comprehensive metabolic panel  Tomorrow morning,   R        06/07/21 2024   06/08/21 0500  CBC  Tomorrow morning,   R        06/07/21 2024   06/07/21 2022  CBC  (heparin)  Once,   R       Comments: Baseline for heparin therapy IF NOT ALREADY DRAWN.  Notify MD if PLT < 100 K.    06/07/21 2024    06/07/21 2022  Creatinine, serum  (heparin)  Once,   R  Comments: Baseline for heparin therapy IF NOT ALREADY DRAWN.    06/07/21 2024   06/07/21 1936  Resp Panel by RT-PCR (Flu A&B, Covid) Nasopharyngeal Swab  Once,   STAT        06/07/21 1936            Vitals/Pain Today's Vitals   06/07/21 1823 06/07/21 1912 06/07/21 1912 06/07/21 1930  BP: (!) 160/112 129/90  131/90  Pulse: (!) 125 98  98  Resp: (!) 23 15  14   Temp: 98.6 F (37 C)     TempSrc: Oral     SpO2: 97% 96%  96%  Weight:      Height:      PainSc:   5      Isolation Precautions No active isolations  Medications Medications  heparin injection 5,000 Units (has no administration in time range)  acetaminophen (TYLENOL) tablet 650 mg (has no administration in time range)    Or  acetaminophen (TYLENOL) suppository 650 mg (has no administration in time range)  morphine (PF) 2 MG/ML injection 1 mg (has no administration in time range)  dextrose 5 % in lactated ringers infusion (has no administration in time range)  potassium chloride 10 mEq in 100 mL IVPB (has no administration in time range)  sodium chloride 0.9 % bolus 1,000 mL (0 mLs Intravenous Stopped 06/07/21 2034)  HYDROmorphone (DILAUDID) injection 1 mg (1 mg Intravenous Given 06/07/21 1829)  ondansetron (ZOFRAN) injection 4 mg (4 mg Intravenous Given 06/07/21 1828)  iohexol (OMNIPAQUE) 300 MG/ML solution 100 mL (100 mLs Intravenous Contrast Given 06/07/21 1851)    Mobility walks Low fall risk   Focused Assessments    R Recommendations: See Admitting Provider Note  Report given to:   Additional Notes:

## 2021-06-07 NOTE — Plan of Care (Signed)
  Problem: Activity: Goal: Risk for activity intolerance will decrease Outcome: Progressing   Problem: Nutrition: Goal: Adequate nutrition will be maintained Outcome: Progressing   Problem: Coping: Goal: Level of anxiety will decrease Outcome: Progressing   Problem: Pain Managment: Goal: General experience of comfort will improve Outcome: Progressing   Problem: Safety: Goal: Ability to remain free from injury will improve Outcome: Progressing   

## 2021-06-08 ENCOUNTER — Inpatient Hospital Stay (HOSPITAL_COMMUNITY): Payer: Managed Care, Other (non HMO)

## 2021-06-08 DIAGNOSIS — C50411 Malignant neoplasm of upper-outer quadrant of right female breast: Secondary | ICD-10-CM

## 2021-06-08 DIAGNOSIS — D72829 Elevated white blood cell count, unspecified: Secondary | ICD-10-CM

## 2021-06-08 DIAGNOSIS — Z170 Estrogen receptor positive status [ER+]: Secondary | ICD-10-CM

## 2021-06-08 LAB — COMPREHENSIVE METABOLIC PANEL
ALT: 20 U/L (ref 0–44)
AST: 25 U/L (ref 15–41)
Albumin: 3.9 g/dL (ref 3.5–5.0)
Alkaline Phosphatase: 103 U/L (ref 38–126)
Anion gap: 11 (ref 5–15)
BUN: 12 mg/dL (ref 6–20)
CO2: 20 mmol/L — ABNORMAL LOW (ref 22–32)
Calcium: 9.2 mg/dL (ref 8.9–10.3)
Chloride: 105 mmol/L (ref 98–111)
Creatinine, Ser: 0.69 mg/dL (ref 0.44–1.00)
GFR, Estimated: >60 mL/min (ref >60)
Glucose, Bld: 162 mg/dL — ABNORMAL HIGH (ref 70–99)
Potassium: 4 mmol/L (ref 3.5–5.1)
Sodium: 136 mmol/L (ref 135–145)
Total Bilirubin: 0.9 mg/dL (ref 0.3–1.2)
Total Protein: 7.5 g/dL (ref 6.5–8.1)

## 2021-06-08 LAB — CBC
HCT: 40.5 % (ref 36.0–46.0)
Hemoglobin: 13.5 g/dL (ref 12.0–15.0)
MCH: 26.8 pg (ref 26.0–34.0)
MCHC: 33.3 g/dL (ref 30.0–36.0)
MCV: 80.5 fL (ref 80.0–100.0)
Platelets: 272 10*3/uL (ref 150–400)
RBC: 5.03 MIL/uL (ref 3.87–5.11)
RDW: 14 % (ref 11.5–15.5)
WBC: 17.1 10*3/uL — ABNORMAL HIGH (ref 4.0–10.5)
nRBC: 0 % (ref 0.0–0.2)

## 2021-06-08 LAB — GLUCOSE, CAPILLARY
Glucose-Capillary: 138 mg/dL — ABNORMAL HIGH (ref 70–99)
Glucose-Capillary: 155 mg/dL — ABNORMAL HIGH (ref 70–99)
Glucose-Capillary: 174 mg/dL — ABNORMAL HIGH (ref 70–99)

## 2021-06-08 LAB — C-REACTIVE PROTEIN: CRP: 3.3 mg/dL — ABNORMAL HIGH (ref <1.0)

## 2021-06-08 MED ORDER — PHENOL 1.4 % MT LIQD: 1.0000 | OROMUCOSAL | Status: DC | PRN

## 2021-06-08 MED ORDER — MESALAMINE 1.2 G PO TBEC
4.8000 g | DELAYED_RELEASE_TABLET | Freq: Every day | ORAL | Status: DC
  Filled 2021-06-08 (×5): qty 4

## 2021-06-08 MED ORDER — DIATRIZOATE MEGLUMINE & SODIUM 66-10 % PO SOLN
90.0000 mL | Freq: Once | ORAL | Status: AC
  Administered 2021-06-08: 90 mL via NASOGASTRIC
  Filled 2021-06-08: qty 90

## 2021-06-08 NOTE — Progress Notes (Signed)
? ? ?   ?Subjective: ?Patient known to our service for two prior SBOs of similar fashion in October and just in the last 1-2 weeks requiring admission.  She was doing well after her last discharge until yesterday she began having abdominal pain with N/V.  She normally has 2-3 BMs a day, but hasn't had one since Monday.  Her new CT scan shows SBO at the level of ileosigmoid anastomosis.  She has been admitted and an NGT has been placed. ? ?Objective: ?Vital signs in last 24 hours: ?Temp:  [98 ?F (36.7 ?C)-98.6 ?F (37 ?C)] 98 ?F (36.7 ?C) (03/01 7026) ?Pulse Rate:  [85-125] 109 (03/01 0925) ?Resp:  [14-23] 16 (03/01 0925) ?BP: (127-160)/(90-112) 157/102 (03/01 0925) ?SpO2:  [96 %-100 %] 98 % (03/01 0925) ?Weight:  [63 kg] 63 kg (02/28 1814) ?Last BM Date : 06/06/21 ? ?Intake/Output from previous day: ?02/28 0701 - 03/01 0700 ?In: 3785 [I.V.:15; IV Piggyback:1000] ?Out: 100 [Emesis/NG output:100] ?Intake/Output this shift: ?No intake/output data recorded. ? ?PE: ?Gen:  Alert, NAD, pleasant ?Pulm: Rate and effort normal ?Abd: Soft, some distention, mild central tenderness, NGT in place with several hundred ccs of output. ?Psych: A&Ox3  ? ? ?Lab Results:  ?Recent Labs  ?  06/07/21 ?1818 06/07/21 ?1835 06/08/21 ?0411  ?WBC 21.9*  --  17.1*  ?HGB 14.0 15.6* 13.5  ?HCT 42.8 46.0 40.5  ?PLT 343  --  272  ? ?BMET ?Recent Labs  ?  06/07/21 ?1818 06/07/21 ?1835 06/08/21 ?0411  ?NA 137 140 136  ?K 3.2* 3.3* 4.0  ?CL 113* 107 105  ?CO2 20*  --  20*  ?GLUCOSE 159* 159* 162*  ?BUN 11 9 12   ?CREATININE 0.86 0.70 0.69  ?CALCIUM 9.4  --  9.2  ? ?PT/INR ?No results for input(s): LABPROT, INR in the last 72 hours. ?CMP  ?   ?Component Value Date/Time  ? NA 136 06/08/2021 0411  ? NA 135 01/21/2020 1056  ? K 4.0 06/08/2021 0411  ? CL 105 06/08/2021 0411  ? CO2 20 (L) 06/08/2021 0411  ? GLUCOSE 162 (H) 06/08/2021 0411  ? BUN 12 06/08/2021 0411  ? BUN 12 01/21/2020 1056  ? CREATININE 0.69 06/08/2021 0411  ? CREATININE 0.79 01/21/2021 0817   ? CALCIUM 9.2 06/08/2021 0411  ? PROT 7.5 06/08/2021 0411  ? ALBUMIN 3.9 06/08/2021 0411  ? AST 25 06/08/2021 0411  ? AST 17 01/21/2021 0817  ? ALT 20 06/08/2021 0411  ? ALT 13 01/21/2021 0817  ? ALKPHOS 103 06/08/2021 0411  ? BILITOT 0.9 06/08/2021 0411  ? BILITOT 0.5 01/21/2021 0817  ? GFRNONAA >60 06/08/2021 0411  ? GFRNONAA >60 01/21/2021 0817  ? GFRAA 108 01/21/2020 1056  ? ?Lipase  ?   ?Component Value Date/Time  ? LIPASE 34 06/07/2021 1818  ? ? ?Studies/Results: ?CT ABDOMEN PELVIS W CONTRAST ? ?Result Date: 06/07/2021 ?CLINICAL DATA:  Abdominal pain, nausea and vomiting, hiccups EXAM: CT ABDOMEN AND PELVIS WITH CONTRAST TECHNIQUE: Multidetector CT imaging of the abdomen and pelvis was performed using the standard protocol following bolus administration of intravenous contrast. RADIATION DOSE REDUCTION: This exam was performed according to the departmental dose-optimization program which includes automated exposure control, adjustment of the mA and/or kV according to patient size and/or use of iterative reconstruction technique. CONTRAST:  187m OMNIPAQUE IOHEXOL 300 MG/ML  SOLN COMPARISON:  05/27/2021 FINDINGS: Lower chest: No acute pleural or parenchymal lung disease. Hepatobiliary: No focal liver abnormality is seen. No gallstones, gallbladder wall thickening,  or biliary dilatation. Pancreas: Unremarkable. No pancreatic ductal dilatation or surrounding inflammatory changes. Spleen: Normal in size without focal abnormality. Adrenals/Urinary Tract: Adrenal glands are unremarkable. Kidneys are normal, without renal calculi, focal lesion, or hydronephrosis. Bladder is decompressed, limiting its evaluation. Stomach/Bowel: Postsurgical changes from subtotal colectomy. There is progressive small-bowel obstruction with abrupt transition at the ileo sigmoid anastomosis within the central upper pelvis. Increasing caliber of the dilated small bowel measuring up to 5 cm, with diffuse gas fluid levels.  Vascular/Lymphatic: No significant vascular findings are present. No enlarged abdominal or pelvic lymph nodes. Reproductive: A large heterogeneous uterus consistent with fibroids unchanged. No adnexal masses. Other: No free fluid or free intraperitoneal gas. No abdominal wall hernia. Musculoskeletal: Bilateral breast prostheses. No acute or destructive bony lesions. Reconstructed images demonstrate no additional findings. IMPRESSION: 1. Progressive high-grade small bowel obstruction, with abrupt transition point at the site of prior ileo sigmoid anastomosis within the central upper pelvis. Increasing caliber of the small bowel with diffuse gas fluid levels again noted. 2. Fibroid uterus. Electronically Signed   By: Randa Ngo M.D.   On: 06/07/2021 19:32   ? ?Anti-infectives: ?Anti-infectives (From admission, onward)  ? ? None  ? ?  ? ? ? ?Assessment/Plan ?Recurrent SBO ?Hx Crohn's s/p robotic assisted subtotal colectomy with ileo-sigmoid anastomosis 06/27/19 by Dr. Marcello Moores.  On maintenance Lialda/Mesalamine ?- unfortunately this is the patient's 3rd admission in the last 5ish months at the same location.  Her anastomosis was evaluated with c-scope in October but appeared healthy with no signs of narrowing.  It is possible this is from extrinsic compression from adhesive disease. ?-we need to discuss this with Dr. Marcello Moores who last operated on her to get some recommendations. ?-concerning that she may need some type of intervention this how quickly this continues to recur. ?-will start SBO protocol for now and allow decompression ? ?FEN - NPO/NGT/IVFs ?VTE - SCDs, heparin (refusing) ?ID - None currently ? ?Breast CA on Chemo - followed by Dr. Sonny Dandy. Hx bilateral nipple sparing mastectomy 11/09/20 by [name]  ?Hx iron deficiency anemia  ? ?I reviewed the last 24 hours of vital signs, I/O, pain scores, MAR, hospitalist and nursing notes. ? ?This care required moderate level of medical decision making.  ? ? LOS: 1  day  ? ? ?Henreitta Cea , PA-C ?[facility] ?06/08/2021, 10:17 AM ?Please see [facility] for pager number during day hours 7:00am-4:30pm ? ?

## 2021-06-08 NOTE — Progress Notes (Signed)
Transition of Care (TOC) Screening Note ? ?Patient Details  ?Name: [name] ?Date of Birth: 02-08-1976 ? ?Transition of Care (TOC) CM/SW Contact:    ?Sherie Don, LCSW ?Phone Number: ?06/08/2021, 11:00 AM ? ?Transition of Care Department [facility]) has reviewed patient and no TOC needs have been identified at this time. We will continue to monitor patient advancement through interdisciplinary progression rounds. If new patient transition needs arise, please place a TOC consult. ?

## 2021-06-08 NOTE — Assessment & Plan Note (Signed)
Patient follows with Dr. Lindi Adie. Currently receiving chemotherapy as an outpatient. She is s/p bilateral mastectomies. ?

## 2021-06-08 NOTE — Consult Note (Signed)
Consultation  Referring Provider:   Margie Billet, PA-C, General Surgery Primary Care Physician:  Pcp, No Primary Gastroenterologist: Dr. Carlean Purl        Reason for Consultation: Stricturing Crohn's with recurrent bowel obstruction         HPI:   [name] is a 46 y.o. female with a past medical history as listed below including breast cancer on chemo and immunotherapy Crohn's disease status post colectomy with ileorectal anastomosis in 2021, who presented to the ER on 06/07/2021 with abdominal pain, nausea and vomiting.    Of note patient has been admitted about 5 times with bowel obstruction.  The last was about a week ago.    Today, the patient is not very conversive, she was just in the hospital and appears exhausted at the fact that she is back.  Tells me that she started with abdominal pain about 24 hours ago and then nausea and vomiting which is very similar to her recent presentation for bowel obstruction so she returned to the ER.  Prior to this she had continued to have normal bowel movements and was eating okay.  Tells me she is still on her Mesalamine 4.8 g daily.  She is passing a little gas but has not had a stool since 06/06/2021.  Continues with some waves of abdominal pain now regardless of NG tube.    Denies fever, chills or blood in her stool.  ER course: CTAP shows high-grade small bowel obstruction with transition point in the ileosigmoid anastomotic area, surgery was consulted  GI history: 05/27/2021-05/30/2021 admission for small bowel obstruction (our service was not consulted): It appears at that time patient had IV hydration, bowel rest and NG decompression and pain control with Dilaudid, general surgery was consulted and NG tube was discontinued, she started having better bowel movements and was discharged 01/27/2021 flex sig: With fair preparation of the colon, patent ileosigmoid surgical anastomosis which appeared healthy, ileum deeply intubated with some  scattered areas of small erosions and nonspecific mild erythema, but no significant inflammation, stenosis/stricture/mass lesions, erythematous mucosa in the distal rectum and colon otherwise normal but views limited due to prep, at that time noted unclear cause of obstruction but no focal pathology noted on exam; pathology showed focal erosion with active inflammation and pyloric metaplasia consistent with Crohn's ileitis, rectal mucosa with altered crypt architecture and reactive lymphoid aggregates consistent with chronic colitis 01/26/2021 admission for small bowel obstruction and Crohn's disease at that time noted to be status post subtotal colectomy with ileo and sigmoid anastomosis March 2021 and development of colitis thought due to chemotherapy earlier that year, since then had been on immunotherapy and receiving radiation therapy for breast cancer, that a.m. she had acutely developed vomiting and found to have obstruction near the surgical anastomosis, NG tube placed and n.p.o. she was feeling better, recommended a flex sig 07/20/2020 office visit Dr. Carlean Purl at that time was because she was improved she did appear persistent chronic colitis in the remaining colon, also had recent Taxotere related toxicity and was neutropenic and is recovering, was recommended that she come back in July she was continued on twice daily Mesalamine 2.4 g twice daily  Past Medical History:  Diagnosis Date   Cancer [facility])    Colon stricture ([place]) 05/07/2019   Crohn's colitis, with intestinal obstruction ([place]) 09/17/2013   Dx 2006 - right and left colon involved 2011 - pan-colitis 09/17/2013 left colitis 60-20 cm March 2021 subtotal colectomy ileosigmoid anastomosis for  colonic strictures-Dr. Marcello Moores     Crohn's disease [facility])    Family history of breast cancer 06/11/2020   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 10/17/2010   Rectal bleeding    Uterine fibroid    Vitamin D  deficiency 09/24/2013    Past Surgical History:  Procedure Laterality Date   AXILLARY LYMPH NODE DISSECTION Right 11/09/2020   Procedure: RIGHT AXILLARY LYMPH NODE DISSECTION;  Surgeon: [name], MD;  Location: [place];  Service: General;  Laterality: Right;   BIOPSY  07/01/2020   Procedure: BIOPSY;  Surgeon: Yetta Flock, MD;  Location: [facility];  Service: Gastroenterology;;   BIOPSY  01/27/2021   Procedure: BIOPSY;  Surgeon: Yetta Flock, MD;  Location: [facility];  Service: Gastroenterology;;   BREAST RECONSTRUCTION WITH PLACEMENT OF TISSUE EXPANDER AND FLEX HD (ACELLULAR HYDRATED DERMIS) Bilateral 11/09/2020   Procedure: BILATERAL BREAST RECONSTRUCTION WITH PLACEMENT OF TISSUE EXPANDER AND FLEX HD (ACELLULAR HYDRATED DERMIS);  Surgeon: [name], MD;  Location: [place];  Service: Plastics;  Laterality: Bilateral;   COLECTOMY  06/27/2019   COLONOSCOPY  2015   FLEXIBLE SIGMOIDOSCOPY N/A 07/01/2020   Procedure: FLEXIBLE SIGMOIDOSCOPY;  Surgeon: Yetta Flock, MD;  Location: [facility];  Service: Gastroenterology;  Laterality: N/A;   FLEXIBLE SIGMOIDOSCOPY N/A 01/27/2021   Procedure: FLEXIBLE SIGMOIDOSCOPY;  Surgeon: Yetta Flock, MD;  Location: [facility];  Service: Gastroenterology;  Laterality: N/A;   NIPPLE SPARING MASTECTOMY Bilateral 11/09/2020   Procedure: BILATERAL NIPPLE SPARING MASTECTOMY;  Surgeon: [name], MD;  Location: [place];  Service: General;  Laterality: Bilateral;   PORTACATH PLACEMENT N/A 06/23/2020   Procedure: INSERTION PORT-A-CATH;  Surgeon: [name], MD;  Location: [place];  Service: General;  Laterality: N/A;  START TIME OF 11:00 AM FOR 40 MINUTES [place] [facility]    Family History  Problem Relation Age of Onset   Breast cancer Maternal Aunt        dx unknown age   Diabetes Father    Diabetes Paternal Aunt     Breast cancer Cousin        maternal cousin; dx unknown age   Esophageal cancer Neg Hx    Rectal cancer Neg Hx    Stomach cancer Neg Hx    Colon polyps Neg Hx    Colon cancer Neg Hx    Social History   Tobacco Use   Smoking status: Former    Types: Cigarettes   Smokeless tobacco: Never  Vaping Use   Vaping Use: Never used  Substance Use Topics   Alcohol use: Yes    Alcohol/week: 1.0 - 2.0 standard drink    Types: 1 - 2 Glasses of wine per week   Drug use: No    Prior to Admission medications   Medication Sig Start Date End Date Taking? Authorizing Provider  acetaminophen (TYLENOL) 500 MG tablet Take 500-1,000 mg by mouth every 6 (six) hours as needed for mild pain.   Yes [provider]  lidocaine-prilocaine (EMLA) cream Apply 1 application topically as needed (Prior to port access). 06/16/20  Yes Benay Pike, MD  mesalamine (LIALDA) 1.2 g EC tablet Take 2 tablets (2.4 g total) by mouth 2 (two) times daily with a meal. 07/22/20  Yes [name], MD  Multiple Vitamins-Minerals (MULTIVITAMIN WITH MINERALS) tablet Take 1 tablet by mouth daily.   Yes [provider]  polyethylene glycol (MIRALAX) 17 g packet  Take 17 g by mouth daily as needed. Patient taking differently: Take 17 g by mouth daily as needed for mild constipation. 01/28/21  Yes Maczis, Barth Kirks, PA-C  prochlorperazine (COMPAZINE) 10 MG tablet Take 1 tablet (10 mg total) by mouth every 6 (six) hours as needed (Nausea or vomiting). 06/16/20 11/19/20  Benay Pike, MD    Current Facility-Administered Medications  Medication Dose Route Frequency Provider Last Rate Last Admin   acetaminophen (TYLENOL) tablet 650 mg  650 mg Oral Q6H PRN Rise Patience, MD       Or   acetaminophen (TYLENOL) suppository 650 mg  650 mg Rectal Q6H PRN Rise Patience, MD       dextrose 5 % in lactated ringers infusion   Intravenous Continuous Rise Patience, MD 100 mL/hr at 06/08/21 0537 New  Bag at 06/08/21 0537   morphine (PF) 2 MG/ML injection 1 mg  1 mg Intravenous Q3H PRN Rise Patience, MD   1 mg at 06/08/21 1332   ondansetron (ZOFRAN) injection 4 mg  4 mg Intravenous Q6H PRN Rise Patience, MD   4 mg at 06/08/21 0530    Allergies as of 06/07/2021 - Review Complete 06/07/2021  Allergen Reaction Noted   Nsaids Other (See Comments) 05/27/2021   Other  06/23/2019     Review of Systems:    Constitutional: No weight loss, fever or chills Skin: No rash Cardiovascular: No chest pain Respiratory: No SOB Gastrointestinal: See HPI and otherwise negative Genitourinary: No dysuria  Neurological: No headache, dizziness or syncope Musculoskeletal: No new muscle or joint pain Hematologic: No bleeding Psychiatric: No history of depression or anxiety    Physical Exam:  Vital signs in last 24 hours: Temp:  [98 F (36.7 C)-98.6 F (37 C)] 98.1 F (36.7 C) (03/01 1324) Pulse Rate:  [85-125] 116 (03/01 1324) Resp:  [14-23] 18 (03/01 1324) BP: (127-160)/(90-112) 155/106 (03/01 1324) SpO2:  [96 %-100 %] 99 % (03/01 1324) Weight:  [17 kg] 63 kg (02/28 1814) Last BM Date : 06/06/21 General:   AA female appears to be in NAD, Well developed, Well nourished, alert and cooperative +NG tube in place  Head:  Normocephalic and atraumatic. Eyes:   PEERL, EOMI. No icterus. Conjunctiva pink. Ears:  Normal auditory acuity. Neck:  Supple Throat: Oral cavity and pharynx without inflammation, swelling or lesion.  Lungs: Respirations even and unlabored. Lungs clear to auscultation bilaterally.   No wheezes, crackles, or rhonchi.  Heart: Normal S1, S2. No MRG. Regular rate and rhythm. No peripheral edema, cyanosis or pallor.  Abdomen:  Soft, mild distention, moderate generalized TTP in the upper abdomen. No rebound or guarding.  Decreased bowel sounds no appreciable masses or hepatomegaly. Rectal:  Not performed.  Msk:  Symmetrical without gross deformities. Peripheral pulses  intact.  Extremities:  Without edema, no deformity or joint abnormality.  Neurologic:  Alert and  oriented x4;  grossly normal neurologically.  Skin:   Dry and intact without significant lesions or rashes. Psychiatric: Demonstrates good judgement and reason without abnormal affect or behaviors.   LAB RESULTS: Recent Labs    06/07/21 1818 06/07/21 1835 06/08/21 0411  WBC 21.9*  --  17.1*  HGB 14.0 15.6* 13.5  HCT 42.8 46.0 40.5  PLT 343  --  272   BMET Recent Labs    06/07/21 1818 06/07/21 1835 06/08/21 0411  NA 137 140 136  K 3.2* 3.3* 4.0  CL 113* 107 105  CO2 20*  --  20*  GLUCOSE 159* 159* 162*  BUN 11 9 12   CREATININE 0.86 0.70 0.69  CALCIUM 9.4  --  9.2   LFT Recent Labs    06/08/21 0411  PROT 7.5  ALBUMIN 3.9  AST 25  ALT 20  ALKPHOS 103  BILITOT 0.9    STUDIES: CT ABDOMEN PELVIS W CONTRAST  Result Date: 06/07/2021 CLINICAL DATA:  Abdominal pain, nausea and vomiting, hiccups EXAM: CT ABDOMEN AND PELVIS WITH CONTRAST TECHNIQUE: Multidetector CT imaging of the abdomen and pelvis was performed using the standard protocol following bolus administration of intravenous contrast. RADIATION DOSE REDUCTION: This exam was performed according to the departmental dose-optimization program which includes automated exposure control, adjustment of the mA and/or kV according to patient size and/or use of iterative reconstruction technique. CONTRAST:  154m OMNIPAQUE IOHEXOL 300 MG/ML  SOLN COMPARISON:  05/27/2021 FINDINGS: Lower chest: No acute pleural or parenchymal lung disease. Hepatobiliary: No focal liver abnormality is seen. No gallstones, gallbladder wall thickening, or biliary dilatation. Pancreas: Unremarkable. No pancreatic ductal dilatation or surrounding inflammatory changes. Spleen: Normal in size without focal abnormality. Adrenals/Urinary Tract: Adrenal glands are unremarkable. Kidneys are normal, without renal calculi, focal lesion, or hydronephrosis. Bladder is  decompressed, limiting its evaluation. Stomach/Bowel: Postsurgical changes from subtotal colectomy. There is progressive small-bowel obstruction with abrupt transition at the ileo sigmoid anastomosis within the central upper pelvis. Increasing caliber of the dilated small bowel measuring up to 5 cm, with diffuse gas fluid levels. Vascular/Lymphatic: No significant vascular findings are present. No enlarged abdominal or pelvic lymph nodes. Reproductive: A large heterogeneous uterus consistent with fibroids unchanged. No adnexal masses. Other: No free fluid or free intraperitoneal gas. No abdominal wall hernia. Musculoskeletal: Bilateral breast prostheses. No acute or destructive bony lesions. Reconstructed images demonstrate no additional findings. IMPRESSION: 1. Progressive high-grade small bowel obstruction, with abrupt transition point at the site of prior ileo sigmoid anastomosis within the central upper pelvis. Increasing caliber of the small bowel with diffuse gas fluid levels again noted. 2. Fibroid uterus. Electronically Signed   By: MRanda NgoM.D.   On: 06/07/2021 19:32     Impression / Plan:   Impression: 1.  High-grade distal small bowel obstruction with transition point in the region of ileocolic anastomosis: Last flex sig 01/27/2021 showed no focal pathology, anastomosis appeared patent, mild scattered inflammatory changes of the ileum of unclear significance, biopsies did show continued colitis and patient was supposed to have follow-up in our clinic, does have leukocytosis; consider Crohn's versus fibrotic stricture causing recurrent small bowel obstructions 2.  Crohn's colitis status post subtotal colectomy with ileal to sigmoid anastomosis in March 2021 by Dr. TMarcello Moores Had been maintained on Mesalamine 4.8 g daily, previous diffuse inflammation in the distal rectum 3/22 was thought due to drug-induced from Docetaxil 3.  Breast cancer status post bilateral nipple sparing mastectomy  11/09/2020  Plan: 1.  At this point we will wait on surgery to make their final recommendations.  If no plans for surgery then could start IV steroids to see if this helps with current symptoms and may need to discuss changing maintenance therapy 2.  Diet as per general surgery 3.  Ordered CRP 4.  Continue Lialda 4.8 g daily for now  Thank you for your kind consultation, we will continue to follow.  JLavone NianLemmon  06/08/2021, 3:13 PM

## 2021-06-08 NOTE — Assessment & Plan Note (Addendum)
GI consulted for concern that Crohn disease is contributing to recurrent SBO. Initiation of Solu-medrol led to improvement of obstructive symptoms. GI recommendation for prednisone on discharge with slow taper and outpatient follow-up in 4 weeks. ?

## 2021-06-08 NOTE — Hospital Course (Addendum)
[name] is a 46 y.o. female with a history of Crohn disease s/p colectomy with ileosigmoid anastomosis, breast cancer s/p bilateral mastectomies currently on chemotherapy. Patient presented secondary to abdominal pain, nausea and vomiting. CT abdomen/pelvis significant for evidence of a high grade small bowel obstruction at the site of prior ileosigmoid anastomosis. General surgery consulted for management. NG tube placed and patient is NPO. IV steroids initiated for possible Crohn's flare. Bowel function is improved and NG tube removed. Diet advanced. Patient transitioned to prednisone with taper. ?

## 2021-06-08 NOTE — Assessment & Plan Note (Addendum)
Recurrent issue. Complicated by history of Crohn disease and prior colectomy with ileosigmoid anastomosis. CT scan on admission significant for high-grade SBO with transition point at site of prior ileosigmoid anastomosis. General surgery consulted on admission. NG tube placed and patient made NPO. Patient has started having bowel function with bowel movements. NG tube removed on 3/4 and diet advanced to soft diet. Resolved. ?

## 2021-06-08 NOTE — Progress Notes (Signed)
PROGRESS NOTE    [name]  VOZ:[id number] DOB: [birth year] DOA: 06/07/2021 PCP: Pcp, No   Brief Narrative: [name] is a 46 y.o. female with a history of Crohn disease s/p colectomy with ileosigmoid anastomosis, breast cancer s/p bilateral mastectomies currently on chemotherapy. Patient presented secondary to abdominal pain, nausea and vomiting. CT abdomen/pelvis significant for evidence of a high grade small bowel obstruction at the site of prior ileosigmoid anastomosis. General surgery consulted for management. NG tube placed and patient is NPO.   Assessment and Plan: * SBO (small bowel obstruction) ([place])- (present on admission) Recurrent issue. Complicated by history of Crohn disease and prior colectomy with ileosigmoid anastomosis. CT scan on admission significant for high-grade SBO with transition point at site of prior ileosigmoid anastomosis. General surgery consulted on admission. NG tube placed. NPO. -General surgery recommendations: small bowel protocol, GI consult -Continue IV fluids  Leucocytosis- (present on admission) Possibly related to Crohn disease. Afebrile. Leukocytosis is improved today. CBC significant for a WBC of 17,100. -CBC in AM  Crohn's disease of small and large intestines with complication ([place])- (present on admission) GI consulted for concern that Crohn disease is contributing to recurrent SBO. -GI recommendations: awaiting general surgery recommendations prior to starting treatment  Malignant neoplasm of upper-outer quadrant of right breast in female, estrogen receptor positive ([place]) Patient follows with Dr. Lindi Adie. Currently receiving chemotherapy as an outpatient. She is s/p bilateral mastectomies.    DVT prophylaxis: SCDs (patient has declined chemical VTE prophylaxis) Code Status:   Code Status: Full Code Family Communication: None at bedside Disposition Plan: Discharge home pending general surgery/GI  recommendations/management   Consultants:  General surgery [facility]  Procedures:  NG tube placement  Antimicrobials: None    Subjective: Patient with no flatus. She mentioned she has has a bowel movement consisting of small stool balls.  Objective: BP (!) 143/99 (BP Location: Left Arm)    Pulse (!) 115    Temp 98.6 F (37 C) (Oral)    Resp 17    Ht 5' 4"  (1.626 m)    Wt 63 kg    SpO2 98%    BMI 23.84 kg/m   Examination:  General exam: Appears calm and comfortable Respiratory system: Clear to auscultation. Respiratory effort normal. Cardiovascular system: S1 & S2 heard, RRR. No murmurs, rubs, gallops or clicks. Gastrointestinal system: Abdomen is nondistended, soft and tender in lower quadrants. Decreased bowel sounds heard. Central nervous system: Alert and oriented. No focal neurological deficits. Musculoskeletal: No edema. No calf tenderness Skin: No cyanosis. No rashes Psychiatry: Judgement and insight appear normal. Mood & affect appropriate.    Data Reviewed: I have personally reviewed following labs and imaging studies  CBC Lab Results  Component Value Date   WBC 17.1 (H) 06/08/2021   RBC 5.03 06/08/2021   HGB 13.5 06/08/2021   HCT 40.5 06/08/2021   MCV 80.5 06/08/2021   MCH 26.8 06/08/2021   PLT 272 06/08/2021   MCHC 33.3 06/08/2021   RDW 14.0 06/08/2021   LYMPHSABS 1.1 06/07/2021   MONOABS 0.5 06/07/2021   EOSABS 0.0 06/07/2021   BASOSABS 0.1 42/59/5638     Last metabolic panel Lab Results  Component Value Date   NA 136 06/08/2021   K 4.0 06/08/2021   CL 105 06/08/2021   CO2 20 (L) 06/08/2021   BUN 12 06/08/2021   CREATININE 0.69 06/08/2021   GLUCOSE 162 (H) 06/08/2021   GFRNONAA >60 06/08/2021   GFRAA 108 01/21/2020   CALCIUM  9.2 06/08/2021   PHOS 4.1 05/28/2021   PROT 7.5 06/08/2021   ALBUMIN 3.9 06/08/2021   BILITOT 0.9 06/08/2021   ALKPHOS 103 06/08/2021   AST 25 06/08/2021   ALT 20 06/08/2021   ANIONGAP 11  06/08/2021    GFR: Estimated Creatinine Clearance: 78.3 mL/min (by C-G formula based on SCr of 0.69 mg/dL).  Recent Results (from the past 240 hour(s))  Resp Panel by RT-PCR (Flu A&B, Covid) Nasopharyngeal Swab     Status: None   Collection Time: 06/07/21  7:36 PM   Specimen: Nasopharyngeal Swab; Nasopharyngeal(NP) swabs in vial transport medium  Result Value Ref Range Status   SARS Coronavirus 2 by RT PCR NEGATIVE NEGATIVE Final    Comment: (NOTE) SARS-CoV-2 target nucleic acids are NOT DETECTED.  The SARS-CoV-2 RNA is generally detectable in upper respiratory specimens during the acute phase of infection. The lowest concentration of SARS-CoV-2 viral copies this assay can detect is 138 copies/mL. A negative result does not preclude SARS-Cov-2 infection and should not be used as the sole basis for treatment or other patient management decisions. A negative result may occur with  improper specimen collection/handling, submission of specimen other than nasopharyngeal swab, presence of viral mutation(s) within the areas targeted by this assay, and inadequate number of viral copies(<138 copies/mL). A negative result must be combined with clinical observations, patient history, and epidemiological information. The expected result is Negative.  Fact Sheet for Patients:  EntrepreneurPulse.com.au  Fact Sheet for Healthcare Providers:  IncredibleEmployment.be  This test is no t yet approved or cleared by the [place] FDA and  has been authorized for detection and/or diagnosis of SARS-CoV-2 by FDA under an Emergency Use Authorization (EUA). This EUA will remain  in effect (meaning this test can be used) for the duration of the COVID-19 declaration under Section 564(b)(1) of the Act, 21 U.S.C.section 360bbb-3(b)(1), unless the authorization is terminated  or revoked sooner.       Influenza A by PCR NEGATIVE NEGATIVE Final   Influenza B by PCR  NEGATIVE NEGATIVE Final    Comment: (NOTE) The Xpert Xpress SARS-CoV-2/FLU/RSV plus assay is intended as an aid in the diagnosis of influenza from Nasopharyngeal swab specimens and should not be used as a sole basis for treatment. Nasal washings and aspirates are unacceptable for Xpert Xpress SARS-CoV-2/FLU/RSV testing.  Fact Sheet for Patients: EntrepreneurPulse.com.au  Fact Sheet for Healthcare Providers: IncredibleEmployment.be  This test is not yet approved or cleared by the [place] FDA and has been authorized for detection and/or diagnosis of SARS-CoV-2 by FDA under an Emergency Use Authorization (EUA). This EUA will remain in effect (meaning this test can be used) for the duration of the COVID-19 declaration under Section 564(b)(1) of the Act, 21 U.S.C. section 360bbb-3(b)(1), unless the authorization is terminated or revoked.  Performed at [facility], [place] [address], [place], [place] [address]       Radiology Studies: CT ABDOMEN PELVIS W CONTRAST  Result Date: 06/07/2021 CLINICAL DATA:  Abdominal pain, nausea and vomiting, hiccups EXAM: CT ABDOMEN AND PELVIS WITH CONTRAST TECHNIQUE: Multidetector CT imaging of the abdomen and pelvis was performed using the standard protocol following bolus administration of intravenous contrast. RADIATION DOSE REDUCTION: This exam was performed according to the departmental dose-optimization program which includes automated exposure control, adjustment of the mA and/or kV according to patient size and/or use of iterative reconstruction technique. CONTRAST:  141m OMNIPAQUE IOHEXOL 300 MG/ML  SOLN COMPARISON:  05/27/2021 FINDINGS: Lower chest: No acute pleural  or parenchymal lung disease. Hepatobiliary: No focal liver abnormality is seen. No gallstones, gallbladder wall thickening, or biliary dilatation. Pancreas: Unremarkable. No pancreatic ductal dilatation or surrounding  inflammatory changes. Spleen: Normal in size without focal abnormality. Adrenals/Urinary Tract: Adrenal glands are unremarkable. Kidneys are normal, without renal calculi, focal lesion, or hydronephrosis. Bladder is decompressed, limiting its evaluation. Stomach/Bowel: Postsurgical changes from subtotal colectomy. There is progressive small-bowel obstruction with abrupt transition at the ileo sigmoid anastomosis within the central upper pelvis. Increasing caliber of the dilated small bowel measuring up to 5 cm, with diffuse gas fluid levels. Vascular/Lymphatic: No significant vascular findings are present. No enlarged abdominal or pelvic lymph nodes. Reproductive: A large heterogeneous uterus consistent with fibroids unchanged. No adnexal masses. Other: No free fluid or free intraperitoneal gas. No abdominal wall hernia. Musculoskeletal: Bilateral breast prostheses. No acute or destructive bony lesions. Reconstructed images demonstrate no additional findings. IMPRESSION: 1. Progressive high-grade small bowel obstruction, with abrupt transition point at the site of prior ileo sigmoid anastomosis within the central upper pelvis. Increasing caliber of the small bowel with diffuse gas fluid levels again noted. 2. Fibroid uterus. Electronically Signed   By: Randa Ngo M.D.   On: 06/07/2021 19:32      LOS: 1 day    Cordelia Poche, MD [facility] 06/08/2021, 5:19 PM   If 7PM-7AM, please contact night-coverage [email or web address]

## 2021-06-08 NOTE — Progress Notes (Signed)
Patient in yellow MEWS. Patient had just ambulated back from bathroom and in pain. PRN medicine given. ?

## 2021-06-08 NOTE — Assessment & Plan Note (Addendum)
Possibly related to Crohn disease. Afebrile. Leukocytosis resolved but has worsened slightly after initiation of steroids. ?

## 2021-06-08 NOTE — Telephone Encounter (Addendum)
Patient is currently admitted and being evaluated.  ?

## 2021-06-09 DIAGNOSIS — K50819 Crohn's disease of both small and large intestine with unspecified complications: Secondary | ICD-10-CM | POA: Diagnosis not present

## 2021-06-09 LAB — CBC
HCT: 35.2 % — ABNORMAL LOW (ref 36.0–46.0)
Hemoglobin: 11.3 g/dL — ABNORMAL LOW (ref 12.0–15.0)
MCH: 26.6 pg (ref 26.0–34.0)
MCHC: 32.1 g/dL (ref 30.0–36.0)
MCV: 82.8 fL (ref 80.0–100.0)
Platelets: 271 10*3/uL (ref 150–400)
RBC: 4.25 MIL/uL (ref 3.87–5.11)
RDW: 14 % (ref 11.5–15.5)
WBC: 10 10*3/uL (ref 4.0–10.5)
nRBC: 0 % (ref 0.0–0.2)

## 2021-06-09 LAB — BASIC METABOLIC PANEL
Anion gap: 8 (ref 5–15)
BUN: 9 mg/dL (ref 6–20)
CO2: 28 mmol/L (ref 22–32)
Calcium: 8.9 mg/dL (ref 8.9–10.3)
Chloride: 103 mmol/L (ref 98–111)
Creatinine, Ser: 0.62 mg/dL (ref 0.44–1.00)
GFR, Estimated: >60 mL/min (ref >60)
Glucose, Bld: 119 mg/dL — ABNORMAL HIGH (ref 70–99)
Potassium: 3.2 mmol/L — ABNORMAL LOW (ref 3.5–5.1)
Sodium: 139 mmol/L (ref 135–145)

## 2021-06-09 LAB — GLUCOSE, CAPILLARY
Glucose-Capillary: 100 mg/dL — ABNORMAL HIGH (ref 70–99)
Glucose-Capillary: 118 mg/dL — ABNORMAL HIGH (ref 70–99)
Glucose-Capillary: 118 mg/dL — ABNORMAL HIGH (ref 70–99)
Glucose-Capillary: 121 mg/dL — ABNORMAL HIGH (ref 70–99)
Glucose-Capillary: 129 mg/dL — ABNORMAL HIGH (ref 70–99)

## 2021-06-09 MED ORDER — METHYLPREDNISOLONE SODIUM SUCC 40 MG IJ SOLR
40.0000 mg | Freq: Every day | INTRAMUSCULAR | Status: DC
  Administered 2021-06-09 – 2021-06-12 (×4): 40 mg via INTRAVENOUS
  Filled 2021-06-09 (×5): qty 1

## 2021-06-09 MED ORDER — SODIUM CHLORIDE 0.9 % IV SOLN: INTRAVENOUS | Status: DC

## 2021-06-09 MED ORDER — POTASSIUM CHLORIDE 10 MEQ/100ML IV SOLN
10.0000 meq | INTRAVENOUS | Status: AC
  Filled 2021-06-09: qty 100

## 2021-06-09 NOTE — Assessment & Plan Note (Addendum)
Potassium supplementation given. Now resolved. ?

## 2021-06-09 NOTE — Progress Notes (Signed)
Patient refusing potassium infusion. MD made aware. Patient educated on importance of potassium.  ?

## 2021-06-09 NOTE — Progress Notes (Signed)
? ? ?   ?Subjective: ?Continues to have pain in her abd.  Small amount of flatus, but no BM.  NGT still with bilious output. ? ?Objective: ?Vital signs in last 24 hours: ?Temp:  [98.1 ?F (36.7 ?C)-98.6 ?F (37 ?C)] 98.5 ?F (36.9 ?C) (03/02 0941) ?Pulse Rate:  [103-116] 103 (03/02 0941) ?Resp:  [17-20] 18 (03/02 0941) ?BP: (139-155)/(83-106) 139/98 (03/02 0941) ?SpO2:  [98 %-100 %] 98 % (03/02 0941) ?Last BM Date : 06/06/21 ? ?Intake/Output from previous day: ?03/01 0701 - 03/02 0700 ?In: 0  ?Out: 850 [Urine:100; Emesis/NG output:750] ?Intake/Output this shift: ?No intake/output data recorded. ? ?PE: ?Gen:  Alert, NAD, pleasant ?Abd: Soft, some distention, central to RLQ tenderness, NGT in place with some bilious output ? ? ? ?Lab Results:  ?Recent Labs  ?  06/08/21 ?0411 06/09/21 ?0430  ?WBC 17.1* 10.0  ?HGB 13.5 11.3*  ?HCT 40.5 35.2*  ?PLT 272 271  ? ?BMET ?Recent Labs  ?  06/08/21 ?0411 06/09/21 ?0430  ?NA 136 139  ?K 4.0 3.2*  ?CL 105 103  ?CO2 20* 28  ?GLUCOSE 162* 119*  ?BUN 12 9  ?CREATININE 0.69 0.62  ?CALCIUM 9.2 8.9  ? ?PT/INR ?No results for input(s): LABPROT, INR in the last 72 hours. ?CMP  ?   ?Component Value Date/Time  ? NA 139 06/09/2021 0430  ? NA 135 01/21/2020 1056  ? K 3.2 (L) 06/09/2021 0430  ? CL 103 06/09/2021 0430  ? CO2 28 06/09/2021 0430  ? GLUCOSE 119 (H) 06/09/2021 0430  ? BUN 9 06/09/2021 0430  ? BUN 12 01/21/2020 1056  ? CREATININE 0.62 06/09/2021 0430  ? CREATININE 0.79 01/21/2021 0817  ? CALCIUM 8.9 06/09/2021 0430  ? PROT 7.5 06/08/2021 0411  ? ALBUMIN 3.9 06/08/2021 0411  ? AST 25 06/08/2021 0411  ? AST 17 01/21/2021 0817  ? ALT 20 06/08/2021 0411  ? ALT 13 01/21/2021 0817  ? ALKPHOS 103 06/08/2021 0411  ? BILITOT 0.9 06/08/2021 0411  ? BILITOT 0.5 01/21/2021 0817  ? GFRNONAA >60 06/09/2021 0430  ? GFRNONAA >60 01/21/2021 0817  ? GFRAA 108 01/21/2020 1056  ? ?Lipase  ?   ?Component Value Date/Time  ? LIPASE 34 06/07/2021 1818  ? ? ?Studies/Results: ?CT ABDOMEN PELVIS W  CONTRAST ? ?Result Date: 06/07/2021 ?CLINICAL DATA:  Abdominal pain, nausea and vomiting, hiccups EXAM: CT ABDOMEN AND PELVIS WITH CONTRAST TECHNIQUE: Multidetector CT imaging of the abdomen and pelvis was performed using the standard protocol following bolus administration of intravenous contrast. RADIATION DOSE REDUCTION: This exam was performed according to the departmental dose-optimization program which includes automated exposure control, adjustment of the mA and/or kV according to patient size and/or use of iterative reconstruction technique. CONTRAST:  149m OMNIPAQUE IOHEXOL 300 MG/ML  SOLN COMPARISON:  05/27/2021 FINDINGS: Lower chest: No acute pleural or parenchymal lung disease. Hepatobiliary: No focal liver abnormality is seen. No gallstones, gallbladder wall thickening, or biliary dilatation. Pancreas: Unremarkable. No pancreatic ductal dilatation or surrounding inflammatory changes. Spleen: Normal in size without focal abnormality. Adrenals/Urinary Tract: Adrenal glands are unremarkable. Kidneys are normal, without renal calculi, focal lesion, or hydronephrosis. Bladder is decompressed, limiting its evaluation. Stomach/Bowel: Postsurgical changes from subtotal colectomy. There is progressive small-bowel obstruction with abrupt transition at the ileo sigmoid anastomosis within the central upper pelvis. Increasing caliber of the dilated small bowel measuring up to 5 cm, with diffuse gas fluid levels. Vascular/Lymphatic: No significant vascular findings are present. No enlarged abdominal or pelvic lymph nodes. Reproductive: A  large heterogeneous uterus consistent with fibroids unchanged. No adnexal masses. Other: No free fluid or free intraperitoneal gas. No abdominal wall hernia. Musculoskeletal: Bilateral breast prostheses. No acute or destructive bony lesions. Reconstructed images demonstrate no additional findings. IMPRESSION: 1. Progressive high-grade small bowel obstruction, with abrupt transition  point at the site of prior ileo sigmoid anastomosis within the central upper pelvis. Increasing caliber of the small bowel with diffuse gas fluid levels again noted. 2. Fibroid uterus. Electronically Signed   By: Randa Ngo M.D.   On: 06/07/2021 19:32  ? ?DG Abd Portable 1V-Small Bowel Obstruction Protocol-initial, 8 hr delay ? ?Result Date: 06/08/2021 ?CLINICAL DATA:  Small-bowel obstruction, oral contrast administration EXAM: PORTABLE ABDOMEN - 1 VIEW COMPARISON:  06/07/2021 FINDINGS: Supine frontal view of the abdomen and pelvis demonstrates persistent gaseous distension of the small bowel measuring up to 6.2 cm in diameter. No oral contrast is identified on this exam. Minimal residual contrast within the bladder from previous CT exam. Calcified uterine fibroid. Enteric catheter coiled over the gastric fundus. IMPRESSION: 1. Oral contrast is not definitively identified on this study. 2. Progressive gaseous distention of the small bowel consistent with obstruction. 3. Enteric catheter overlying the gastric fundus. Electronically Signed   By: Randa Ngo M.D.   On: 06/08/2021 21:45   ? ?Anti-infectives: ?Anti-infectives (From admission, onward)  ? ? None  ? ?  ? ? ? ?Assessment/Plan ?Recurrent SBO ?Hx Crohn's s/p robotic assisted subtotal colectomy with ileo-sigmoid anastomosis 06/27/19 by Dr. Marcello Moores.  On maintenance Lialda/Mesalamine ?- SBO protocol with contrast likely being totally sucked back out ?-GI did evaluate yesterday.  CRP is mildly elevated.  I think it is reasonable if GI would like to try tx for acute inflammation/flare to see if this will improve as the patient has already had a fair amount of intestine removed and would not want to just jump to further removal if she were to improve with conservative management. ?-if patient were to fail this, then she may require surgical intervention ?-cont to follow and monitor closely ? ?FEN - NPO/NGT/IVFs ?VTE - SCDs, heparin (refusing) ?ID - None  currently ? ?Breast CA on Chemo - followed by Dr. Sonny Dandy. Hx bilateral nipple sparing mastectomy 11/09/20 by [name]  ?Hx iron deficiency anemia  ? ?I reviewed the last 24 hours of vital signs, I/O, pain scores, MAR, hospitalist and nursing notes. ? ?This care required moderate level of medical decision making.  ? ? LOS: 2 days  ? ? ?Henreitta Cea , PA-C ?[facility] ?06/09/2021, 9:49 AM ?Please see Amion for pager number during day hours 7:00am-4:30pm ? ?

## 2021-06-09 NOTE — Discharge Instructions (Addendum)
[name], ? ?You were in the hospital with a bowel blockage. This seems to have improved with treatment of your Crohn's with steroids. The GI physician group has recommended a prednisone taper. Please follow-up with them in 4 weeks. ? ? ?Low Fiber Nutrition Therapy  ? ?You may need a low-fiber diet if you have Crohn's disease, diverticulitis, gastroparesis, ulcerative colitis, a new colostomy, or new ileostomy. A low-fiber diet may also be needed following radiation therapy to the pelvis and lower bowel or recent intestinal surgery. ? ?A low-fiber diet reduces the frequency and volume of your stools. This lessens irritation to the gastrointestinal (GI) tract and can help you heal. Use this diet if you have a stricture so your intestine doesn't get blocked. The goal of this diet is to get less than 8 grams of fiber daily. It's also important to eat enough protein foods while you are on a low-fiber diet. ? ?Drink nutrition supplements that have 1 gram of fiber or less in each serving. If your stricture is severe or if your inflammation is severe, drink more liquids to reduce symptoms and to get enough calories and protein. ? ?Tips ?Eat about 5 to 6 small meals daily or about every 3 to 4 hours. Do not skip meals.  ?Every time you eat, include a small amount of protein (1 to 2 ounces) plus an additional food. Low fiber starch foods are the best choice to eat with protein.  ?Limit acidic, spicy and high-fat or fried and greasy foods to reduce GI symptoms.  ?Do not eat raw fruits and vegetables while on this diet. All fruits and vegetables need to be cooked and without peels or skins.  ?Drink a lot of fluids, at least 8 cups of fluid each day. Limit drinks with caffeine, sugar, and sugar substitutes.  ?Plain water is the best choice. Avoid mixing drink packets or flavor drops into water. Marland Kitchen  ?Take a chewable multivitamin with minerals. Gummy vitamins do not have enough minerals and can block an ostomy and  non-chewable supplements are not easily digested. Chewable supplements must be used if you have a stricture or ostomy.  ?If you are lactose intolerant, you may need to eat low-lactose dairy products. If you can't tolerate dairy, ask your RDN about how you can get enough calcium from other foods.  ?Do not take a calcium supplement. They can cause a blockage.  ?It is important to add high-calcium foods gradually to your diet and monitor for symptoms to avoid a blockage.  ?Do not add more fiber to your diet until your health care provider or registered dietitian nutritionist (RDN) tells you it's OK. Fiber is part of whole grains, fruits and vegetables (foods from plants) and needs to be slowly added back in to your diet when your body is healed.  ?Choose foods that have been safely handled and prepared to lower your risk of foodborne illness. Talk to your RDN or see the Food Safety Nutrition Therapy handout for more information.  ? ?Foods Recommended ?These foods are low in fat and fiber and will help with your GI symptoms. ?Food Group Foods Recommended  ?Grains  Choose grain foods with less than 2 grams of fiber per serving. ?Refined white flour products--for example, enriched white bread without seeds, crackers or pasta ?Cream of wheat or rice ?Grits (fine ground) ?Tortillas: white flour or corn ?White rice, well-cooked (do not rinse, or soak before cooking) ?Cold and hot cereals made from white or refined flour such  as puffed rice or corn flakes  ?Protein Foods  Lean, very tender, well-cooked poultry or fish; red meats: beef, pork or lamb (slow cook until soft; chop meats if you have stricture or ostomy) ?Eggs, well-cooked ?Smooth nut butters such as almond, peanut, or sunflower ?Tofu  ?Dairy  If you have lactose intolerance, drinking milk products from cows or goats may make diarrhea worse. Foods marked with an asterisk (*) have lactose. ?Milk: fat-free, 1% or 2% * (choose best tolerated) ?Lactose-free  milk ?Buttermilk* ?Fortified non-dairy milks: almond, cashew, coconut, or rice (be aware that these options are not good sources of protein so you will need to eat an additional protein food) ?Kefir* (Don't include kefir in the diet until approved by your health care provider) ?Yogurt*/lactose-free yogurt (without nuts, fruit, granola or chocolate) ?Mild cheese* (hard and aged cheeses tend to be lower in lactose such as cheddar, swiss or parmesan) ?Cottage cheese* or lactose-free cottage cheese ?Low-fat ice cream* or lactose-free ice cream ?Sherbet* (usually lower lactose)  ?Vegetables  Canned and well-cooked vegetables without seeds, skins, or hulls  ?Carrots or green beans, cooked ?White, red or yellow potatoes without skins ?Strained vegetable juice  ?Fruit Soft, and well-cooked fruits without skins, seeds, or membranes ?Canned fruit in juice: peaches, pears, or applesauce ?Fruit juice without pulp diluted by half with water may be tolerated better ?Fruit drinks fortified with vitamin C may be tolerated better than 100% fruit juice  ?Oils  When possible, choose healthy oils and fats, such as olive and canola oils, plant oils rather than solid fats.  ?Other  Broth and strained soups made from allowed foods ?Desserts (small portions) without whole grains, seeds, nuts, raisins, or coconut ?Jelly (clear)  ? ?Foods Not Recommended ?These foods are higher in fat and fiber and may make your GI symptoms worse.  ?Food Group Foods Not Recommended  ?Grains  Bread, whole wheat or with whole grain flour or seeds or nuts ?Brown rice, quinoa, kasha, barley ?Tortillas: whole grain ?Whole wheat pasta ?Whole grain and high-fiber cereals, including oatmeal, bran flakes or shredded wheat ?Popcorn  ?Protein Foods  Steak, pork chops, or other meats that are fatty or have gristle ?Fried meat, Sales executive, or fish ?Seafood with a tough or rubbery texture, such as shrimp ?Luncheon meats such as bologna and salami ?Sausage, bacon, or hot  dogs ?Dried beans, peas, or lentils ?Hummus ?Sushi ?Nuts and chunky nut butters  ?Dairy  Whole milk ?Pea milk and soymilk (may cause diarrhea, gas, bloating, and abdominal pain) ?Cream ?Half-and-half ?Sour cream ?Yogurt with added fruit, nuts, or granola or chocolate  ?Vegetables  Alfalfa or bean sprouts (high fiber and risk for bacteria) ?Raw or undercooked vegetables: beets; broccoli; brussels sprouts; cabbage; cauliflower; collard, mustard, or turnip greens; corn; cucumber; green peas or any kind of peas; kale; lima beans; mushrooms; okra; olives; pickles and relish; onions; parsnips; peppers; potato skins; sauerkraut; spinach; tomatoes  ?Fruit Raw fruit ?Dried fruit ?Avocado, berries, coconut ?Canned fruit in syrup ?Canned fruit with mandarin oranges, papaya or pineapple ?Fruit juice with pulp ?Prune juice ?Fruit skin  ?Oils  Pork rinds  ? ?Low-Fiber (8 grams) Sample 1-Day Menu  ?Breakfast ? cup cream of wheat (0.5 gram fiber)  ?1 slice white toast (1 gram fiber)  ?1 teaspoon margarine, soft tub  ?2 scrambled eggs   ?Morning Snack 1 cup lactose-free nutrition supplement  ?Lunch 2 slices white bread (2 grams fiber)  ?3 tablespoons tuna  ?1 tablespoon mayonnaise  ?1 cup chicken noodle soup (  1 gram fiber)  ?? cup apple juice   ?Afternoon Snack 6 saltine crackers (0.5 gram fiber)  ?2 ounces low-fat cheddar cheese  ?Evening Meal 3 ounces tender chicken breast  ?1 cup white rice (0.5 gram fiber)  ?? cup cooked canned green beans (2 grams fiber)  ?? cup cranberry juice   ?Evening Snack 1 cup lactose-free nutrition supplement  ? ?Copyright 2020 ? Academy of Nutrition and Dietetics ?

## 2021-06-09 NOTE — Progress Notes (Signed)
PROGRESS NOTE    [name]  OZD:[id number] DOB: 1975-07-15 DOA: 06/07/2021 PCP: Pcp, No   Brief Narrative: [name] is a 46 y.o. female with a history of Crohn disease s/p colectomy with ileosigmoid anastomosis, breast cancer s/p bilateral mastectomies currently on chemotherapy. Patient presented secondary to abdominal pain, nausea and vomiting. CT abdomen/pelvis significant for evidence of a high grade small bowel obstruction at the site of prior ileosigmoid anastomosis. General surgery consulted for management. NG tube placed and patient is NPO. IV steroids initiated for possible Crohn's flare.   Assessment and Plan: * SBO (small bowel obstruction) (HCC) Recurrent issue. Complicated by history of Crohn disease and prior colectomy with ileosigmoid anastomosis. CT scan on admission significant for high-grade SBO with transition point at site of prior ileosigmoid anastomosis. General surgery consulted on admission. NG tube placed. NPO. -General surgery recommendations: Follow-up GI and follow treatment for possible Crohn flare -GI recommendations: IV steroid trial for possible Crohn flare -Continue IV fluids  Leucocytosis Possibly related to Crohn disease. Afebrile. Leukocytosis is resolved.  Crohn's disease of small and large intestines with complication ([place]) GI consulted for concern that Crohn disease is contributing to recurrent SBO. -GI recommendations: awaiting general surgery recommendations prior to starting treatment  Hypokalemia Potassium of 3.2 on BMP today -Potassium supplementation  Malignant neoplasm of upper-outer quadrant of right breast in female, estrogen receptor positive ([place]) Patient follows with Dr. Lindi Adie. Currently receiving chemotherapy as an outpatient. She is s/p bilateral mastectomies.    DVT prophylaxis: SCDs (patient has declined chemical VTE prophylaxis) Code Status:   Code Status: Full Code Family Communication: Daughter at  bedside Disposition Plan: Discharge home pending general surgery/GI recommendations/management   Consultants:  General surgery [facility]  Procedures:  NG tube placement  Antimicrobials: None    Subjective: Patient with no flatus. She mentioned she has has a bowel movement consisting of small stool balls.  Objective: BP 139/90 (BP Location: Left Arm)    Pulse 96    Temp 98.5 F (36.9 C) (Oral)    Resp 16    Ht 5' 4"  (1.626 m)    Wt 63 kg    SpO2 99%    BMI 23.84 kg/m   Examination:  General exam: Appears calm and comfortable Respiratory system: Clear to auscultation. Respiratory effort normal. Cardiovascular system: S1 & S2 heard, RRR. No murmurs, rubs, gallops or clicks. Gastrointestinal system: Abdomen is nondistended, soft and tender. Normal bowel sounds heard. Central nervous system: Alert and oriented. No focal neurological deficits. Musculoskeletal: No edema. No calf tenderness Skin: No cyanosis. No rashes Psychiatry: Judgement and insight appear normal. Mood & affect appropriate.    Data Reviewed: I have personally reviewed following labs and imaging studies  CBC Lab Results  Component Value Date   WBC 10.0 06/09/2021   RBC 4.25 06/09/2021   HGB 11.3 (L) 06/09/2021   HCT 35.2 (L) 06/09/2021   MCV 82.8 06/09/2021   MCH 26.6 06/09/2021   PLT 271 06/09/2021   MCHC 32.1 06/09/2021   RDW 14.0 06/09/2021   LYMPHSABS 1.1 06/07/2021   MONOABS 0.5 06/07/2021   EOSABS 0.0 06/07/2021   BASOSABS 0.1 25/95/6387     Last metabolic panel Lab Results  Component Value Date   NA 139 06/09/2021   K 3.2 (L) 06/09/2021   CL 103 06/09/2021   CO2 28 06/09/2021   BUN 9 06/09/2021   CREATININE 0.62 06/09/2021   GLUCOSE 119 (H) 06/09/2021   GFRNONAA >60 06/09/2021  GFRAA 108 01/21/2020   CALCIUM 8.9 06/09/2021   PHOS 4.1 05/28/2021   PROT 7.5 06/08/2021   ALBUMIN 3.9 06/08/2021   BILITOT 0.9 06/08/2021   ALKPHOS 103 06/08/2021   AST 25 06/08/2021    ALT 20 06/08/2021   ANIONGAP 8 06/09/2021    GFR: Estimated Creatinine Clearance: 78.3 mL/min (by C-G formula based on SCr of 0.62 mg/dL).  Recent Results (from the past 240 hour(s))  Resp Panel by RT-PCR (Flu A&B, Covid) Nasopharyngeal Swab     Status: None   Collection Time: 06/07/21  7:36 PM   Specimen: Nasopharyngeal Swab; Nasopharyngeal(NP) swabs in vial transport medium  Result Value Ref Range Status   SARS Coronavirus 2 by RT PCR NEGATIVE NEGATIVE Final    Comment: (NOTE) SARS-CoV-2 target nucleic acids are NOT DETECTED.  The SARS-CoV-2 RNA is generally detectable in upper respiratory specimens during the acute phase of infection. The lowest concentration of SARS-CoV-2 viral copies this assay can detect is 138 copies/mL. A negative result does not preclude SARS-Cov-2 infection and should not be used as the sole basis for treatment or other patient management decisions. A negative result may occur with  improper specimen collection/handling, submission of specimen other than nasopharyngeal swab, presence of viral mutation(s) within the areas targeted by this assay, and inadequate number of viral copies(<138 copies/mL). A negative result must be combined with clinical observations, patient history, and epidemiological information. The expected result is Negative.  Fact Sheet for Patients:  EntrepreneurPulse.com.au  Fact Sheet for Healthcare Providers:  IncredibleEmployment.be  This test is no t yet approved or cleared by the [place] FDA and  has been authorized for detection and/or diagnosis of SARS-CoV-2 by FDA under an Emergency Use Authorization (EUA). This EUA will remain  in effect (meaning this test can be used) for the duration of the COVID-19 declaration under Section 564(b)(1) of the Act, 21 U.S.C.section 360bbb-3(b)(1), unless the authorization is terminated  or revoked sooner.       Influenza A by PCR NEGATIVE  NEGATIVE Final   Influenza B by PCR NEGATIVE NEGATIVE Final    Comment: (NOTE) The Xpert Xpress SARS-CoV-2/FLU/RSV plus assay is intended as an aid in the diagnosis of influenza from Nasopharyngeal swab specimens and should not be used as a sole basis for treatment. Nasal washings and aspirates are unacceptable for Xpert Xpress SARS-CoV-2/FLU/RSV testing.  Fact Sheet for Patients: EntrepreneurPulse.com.au  Fact Sheet for Healthcare Providers: IncredibleEmployment.be  This test is not yet approved or cleared by the [place] FDA and has been authorized for detection and/or diagnosis of SARS-CoV-2 by FDA under an Emergency Use Authorization (EUA). This EUA will remain in effect (meaning this test can be used) for the duration of the COVID-19 declaration under Section 564(b)(1) of the Act, 21 U.S.C. section 360bbb-3(b)(1), unless the authorization is terminated or revoked.  Performed at [facility], [place] [address] [place], [place] [address]       Radiology Studies: CT ABDOMEN PELVIS W CONTRAST  Result Date: 06/07/2021 CLINICAL DATA:  Abdominal pain, nausea and vomiting, hiccups EXAM: CT ABDOMEN AND PELVIS WITH CONTRAST TECHNIQUE: Multidetector CT imaging of the abdomen and pelvis was performed using the standard protocol following bolus administration of intravenous contrast. RADIATION DOSE REDUCTION: This exam was performed according to the departmental dose-optimization program which includes automated exposure control, adjustment of the mA and/or kV according to patient size and/or use of iterative reconstruction technique. CONTRAST:  133m OMNIPAQUE IOHEXOL 300 MG/ML  SOLN COMPARISON:  05/27/2021  FINDINGS: Lower chest: No acute pleural or parenchymal lung disease. Hepatobiliary: No focal liver abnormality is seen. No gallstones, gallbladder wall thickening, or biliary dilatation. Pancreas: Unremarkable. No pancreatic ductal  dilatation or surrounding inflammatory changes. Spleen: Normal in size without focal abnormality. Adrenals/Urinary Tract: Adrenal glands are unremarkable. Kidneys are normal, without renal calculi, focal lesion, or hydronephrosis. Bladder is decompressed, limiting its evaluation. Stomach/Bowel: Postsurgical changes from subtotal colectomy. There is progressive small-bowel obstruction with abrupt transition at the ileo sigmoid anastomosis within the central upper pelvis. Increasing caliber of the dilated small bowel measuring up to 5 cm, with diffuse gas fluid levels. Vascular/Lymphatic: No significant vascular findings are present. No enlarged abdominal or pelvic lymph nodes. Reproductive: A large heterogeneous uterus consistent with fibroids unchanged. No adnexal masses. Other: No free fluid or free intraperitoneal gas. No abdominal wall hernia. Musculoskeletal: Bilateral breast prostheses. No acute or destructive bony lesions. Reconstructed images demonstrate no additional findings. IMPRESSION: 1. Progressive high-grade small bowel obstruction, with abrupt transition point at the site of prior ileo sigmoid anastomosis within the central upper pelvis. Increasing caliber of the small bowel with diffuse gas fluid levels again noted. 2. Fibroid uterus. Electronically Signed   By: Randa Ngo M.D.   On: 06/07/2021 19:32   DG Abd Portable 1V-Small Bowel Obstruction Protocol-initial, 8 hr delay  Result Date: 06/08/2021 CLINICAL DATA:  Small-bowel obstruction, oral contrast administration EXAM: PORTABLE ABDOMEN - 1 VIEW COMPARISON:  06/07/2021 FINDINGS: Supine frontal view of the abdomen and pelvis demonstrates persistent gaseous distension of the small bowel measuring up to 6.2 cm in diameter. No oral contrast is identified on this exam. Minimal residual contrast within the bladder from previous CT exam. Calcified uterine fibroid. Enteric catheter coiled over the gastric fundus. IMPRESSION: 1. Oral contrast is not  definitively identified on this study. 2. Progressive gaseous distention of the small bowel consistent with obstruction. 3. Enteric catheter overlying the gastric fundus. Electronically Signed   By: Randa Ngo M.D.   On: 06/08/2021 21:45      LOS: 2 days    Cordelia Poche, MD [facility] 06/09/2021, 3:13 PM   If 7PM-7AM, please contact night-coverage [email or web address]

## 2021-06-09 NOTE — Progress Notes (Addendum)
? ? ? ?[facility] Progress Note ? ?CC:  Crohn's disease and SBO ? ?Subjective: Pain and distention intermittent/colicky.  Passing some flatus, but no BM. ? ?Objective:  ?Vital signs in last 24 hours: ?Temp:  [98.1 ?F (36.7 ?C)-98.6 ?F (37 ?C)] 98.5 ?F (36.9 ?C) (03/02 0941) ?Pulse Rate:  [103-116] 103 (03/02 0941) ?Resp:  [17-20] 18 (03/02 0941) ?BP: (139-155)/(83-106) 139/98 (03/02 0941) ?SpO2:  [98 %-100 %] 98 % (03/02 0941) ?Last BM Date : 06/06/21 ?General:  Alert, Well-developed, in NAD ?Heart:  Slightly tachy; no murmurs ?Pulm:  CTAB.  No W/R/R. ?Abdomen:  Soft, some distention present.  BS sparse and quiet.  Mild TTP in mid-abdomen. ?Extremities:  Without edema. ?Neurologic:  Alert and oriented x 4;  grossly normal neurologically. ?Psych:  Alert and cooperative. Normal mood and affect. ? ?Intake/Output from previous day: ?03/01 0701 - 03/02 0700 ?In: 0  ?Out: 850 [Urine:100; Emesis/NG output:750] ? ?Lab Results: ?Recent Labs  ?  06/07/21 ?1818 06/07/21 ?1835 06/08/21 ?0411 06/09/21 ?0430  ?WBC 21.9*  --  17.1* 10.0  ?HGB 14.0 15.6* 13.5 11.3*  ?HCT 42.8 46.0 40.5 35.2*  ?PLT 343  --  272 271  ? ?BMET ?Recent Labs  ?  06/07/21 ?1818 06/07/21 ?1835 06/08/21 ?0411 06/09/21 ?0430  ?NA 137 140 136 139  ?K 3.2* 3.3* 4.0 3.2*  ?CL 113* 107 105 103  ?CO2 20*  --  20* 28  ?GLUCOSE 159* 159* 162* 119*  ?BUN 11 9 12 9   ?CREATININE 0.86 0.70 0.69 0.62  ?CALCIUM 9.4  --  9.2 8.9  ? ?LFT ?Recent Labs  ?  06/08/21 ?0411  ?PROT 7.5  ?ALBUMIN 3.9  ?AST 25  ?ALT 20  ?ALKPHOS 103  ?BILITOT 0.9  ? ?CT ABDOMEN PELVIS W CONTRAST ? ?Result Date: 06/07/2021 ?CLINICAL DATA:  Abdominal pain, nausea and vomiting, hiccups EXAM: CT ABDOMEN AND PELVIS WITH CONTRAST TECHNIQUE: Multidetector CT imaging of the abdomen and pelvis was performed using the standard protocol following bolus administration of intravenous contrast. RADIATION DOSE REDUCTION: This exam was performed according to the departmental dose-optimization  program which includes automated exposure control, adjustment of the mA and/or kV according to patient size and/or use of iterative reconstruction technique. CONTRAST:  175m OMNIPAQUE IOHEXOL 300 MG/ML  SOLN COMPARISON:  05/27/2021 FINDINGS: Lower chest: No acute pleural or parenchymal lung disease. Hepatobiliary: No focal liver abnormality is seen. No gallstones, gallbladder wall thickening, or biliary dilatation. Pancreas: Unremarkable. No pancreatic ductal dilatation or surrounding inflammatory changes. Spleen: Normal in size without focal abnormality. Adrenals/Urinary Tract: Adrenal glands are unremarkable. Kidneys are normal, without renal calculi, focal lesion, or hydronephrosis. Bladder is decompressed, limiting its evaluation. Stomach/Bowel: Postsurgical changes from subtotal colectomy. There is progressive small-bowel obstruction with abrupt transition at the ileo sigmoid anastomosis within the central upper pelvis. Increasing caliber of the dilated small bowel measuring up to 5 cm, with diffuse gas fluid levels. Vascular/Lymphatic: No significant vascular findings are present. No enlarged abdominal or pelvic lymph nodes. Reproductive: A large heterogeneous uterus consistent with fibroids unchanged. No adnexal masses. Other: No free fluid or free intraperitoneal gas. No abdominal wall hernia. Musculoskeletal: Bilateral breast prostheses. No acute or destructive bony lesions. Reconstructed images demonstrate no additional findings. IMPRESSION: 1. Progressive high-grade small bowel obstruction, with abrupt transition point at the site of prior ileo sigmoid anastomosis within the central upper pelvis. Increasing caliber of the small bowel with diffuse gas fluid levels again noted. 2. Fibroid uterus. Electronically Signed   By: MLegrand Como  Owens Shark M.D.   On: 06/07/2021 19:32  ? ?DG Abd Portable 1V-Small Bowel Obstruction Protocol-initial, 8 hr delay ? ?Result Date: 06/08/2021 ?CLINICAL DATA:  Small-bowel obstruction,  oral contrast administration EXAM: PORTABLE ABDOMEN - 1 VIEW COMPARISON:  06/07/2021 FINDINGS: Supine frontal view of the abdomen and pelvis demonstrates persistent gaseous distension of the small bowel measuring up to 6.2 cm in diameter. No oral contrast is identified on this exam. Minimal residual contrast within the bladder from previous CT exam. Calcified uterine fibroid. Enteric catheter coiled over the gastric fundus. IMPRESSION: 1. Oral contrast is not definitively identified on this study. 2. Progressive gaseous distention of the small bowel consistent with obstruction. 3. Enteric catheter overlying the gastric fundus. Electronically Signed   By: Randa Ngo M.D.   On: 06/08/2021 21:45   ? ?Assessment / Plan: ?1.  High-grade distal small bowel obstruction with transition point in the region of ileocolic anastomosis: Last flex sig 01/27/2021 showed no focal pathology, anastomosis appeared patent, mild scattered inflammatory changes of the ileum of unclear significance, biopsies did show continued colitis and patient was supposed to have follow-up in our clinic, did have leukocytosis but that has resolved today; consider Crohn's versus fibrotic stricture vs adhesions causing recurrent small bowel obstructions.   ?2.  Crohn's colitis status post subtotal colectomy with ileal to sigmoid anastomosis in March 2021 by Dr. Marcello Moores: Had been maintained on Mesalamine 4.8 g daily, previous diffuse inflammation in the distal rectum 3/22 was thought due to drug-induced from Docetaxil.  CRP currently 3.3. ?3.  Breast cancer status post bilateral nipple sparing mastectomy 11/09/2020 ?4.  Hypokalemia:  K+ 3.2 ? ?-? Trial of IV steroids to see if that helps to open her up before proceeding with another surgery.  Will await Dr. Celesta Aver input (he is her regular GI MD and will be seeing her here in the hospital today). ?-Otherwise supportive care for SBO with NGT, IVFs, antiemetics, etc. ?-Correct hypokalemia to optimize gut  function. ? ? LOS: 2 days  ? ?Laban Emperor. Zehr  06/09/2021, 10:46 AM ? ? ? [facility] Attending  ? ?Patient seen and evaluated. We will treat with IV steroids for possible Crohn's flare to see if she opens up. I think that is very possible. ? ?[name], MD, Marval Regal ?[facility] ?06/09/2021 1:28 PM ? ? ?

## 2021-06-09 NOTE — Progress Notes (Signed)
Initial Nutrition Assessment ? ?INTERVENTION:  ? ?-Will monitor for diet advancement and resolution of SBO ? ?-If diet unable to be advanced by 3/6, recommend initiation of TPN ? ?-If diet advanced, recommend Boost Breeze po TID, each supplement provides 250 kcal and 9 grams of protein ? ? ?NUTRITION DIAGNOSIS:  ? ?Increased nutrient needs related to chronic illness, cancer and cancer related treatments (SBO) as evidenced by estimated needs. ? ?GOAL:  ? ?Patient will meet greater than or equal to 90% of their needs ? ?MONITOR:  ? ?Diet advancement, Labs, Weight trends, I & O's ? ?REASON FOR ASSESSMENT:  ? ?Consult ?Assessment of nutrition requirement/status, Diet education ? ?ASSESSMENT:  ? ?46 y.o. female with a history of Crohn disease s/p colectomy with ileosigmoid anastomosis, breast cancer s/p bilateral mastectomies currently on chemotherapy. Patient presented secondary to abdominal pain, nausea and vomiting. CT abdomen/pelvis significant for evidence of a high grade small bowel obstruction at the site of prior ileosigmoid anastomosis. General surgery consulted for management. NG tube placed and patient is NPO. ? ?Patient lying in bed, not very interactive. NGT in placed, canister was just emptied per patient.  ?Grimacing in pain, did not open eyes during visit. Pt reports eating well until 2/28 when she began to have N/V. States she was never told to follow  a low fiber diet and didn't know what this entailed. Briefly spoke on diet principles but pt unable to engage in full education at this time. Placed information in discharge instructions. Will attempt to review diet prior to discharge. ? ?Pt states she has been losing weight. UBW is 145-150 lbs. ?Per weight records, pt has lost 14 lbs since 01/21/21 (9% wt loss x 4.5 months, significant for time frame). ? ?Medications reviewed. ? ?Labs reviewed: ? CBGs: 118-121 ?Low K ? ?NUTRITION - FOCUSED PHYSICAL EXAM: ? ?Flowsheet Row Most Recent Value  ?Orbital Region  No depletion  ?Upper Arm Region Mild depletion  ?Thoracic and Lumbar Region Unable to assess  [in pain]  ?Buccal Region No depletion  ?Temple Region Mild depletion  ?Clavicle Bone Region No depletion  ?Clavicle and Acromion Bone Region No depletion  ?Scapular Bone Region Unable to assess  ?Dorsal Hand No depletion  ?Patellar Region Unable to assess  ?Anterior Thigh Region Unable to assess  ?Posterior Calf Region Unable to assess  ?Edema (RD Assessment) None  ?Hair Unable to assess  ?Eyes Unable to assess  [kept eyes closed in pain]  ?Mouth Reviewed  ?Skin Reviewed  ? ?  ? ? ?Diet Order:   ?Diet Order   ? ?       ?  Diet NPO time specified Except for: Ice Chips  Diet effective now       ?  ? ?  ?  ? ?  ? ? ?EDUCATION NEEDS:  ? ?Education needs have been addressed ? ?Skin:  Skin Assessment: Reviewed RN Assessment ? ?Last BM:  2/27 ? ?Height:  ? ?Ht Readings from Last 1 Encounters:  ?06/07/21 5' 4"  (1.626 m)  ? ? ?Weight:  ? ?Wt Readings from Last 1 Encounters:  ?06/07/21 63 kg  ? ? ?BMI:  Body mass index is 23.84 kg/m?. ? ?Estimated Nutritional Needs:  ? ?Kcal:  1900-2100 ? ?Protein:  95-110g ? ?Fluid:  2.1L/day ? ?Clayton Bibles, MS, RD, LDN ?Inpatient Clinical Dietitian ?Contact information available via Amion ? ?

## 2021-06-10 LAB — BASIC METABOLIC PANEL
Anion gap: 9 (ref 5–15)
BUN: 13 mg/dL (ref 6–20)
CO2: 29 mmol/L (ref 22–32)
Calcium: 8.6 mg/dL — ABNORMAL LOW (ref 8.9–10.3)
Chloride: 100 mmol/L (ref 98–111)
Creatinine, Ser: 0.7 mg/dL (ref 0.44–1.00)
GFR, Estimated: >60 mL/min (ref >60)
Glucose, Bld: 100 mg/dL — ABNORMAL HIGH (ref 70–99)
Potassium: 3.4 mmol/L — ABNORMAL LOW (ref 3.5–5.1)
Sodium: 138 mmol/L (ref 135–145)

## 2021-06-10 LAB — GLUCOSE, CAPILLARY
Glucose-Capillary: 120 mg/dL — ABNORMAL HIGH (ref 70–99)
Glucose-Capillary: 96 mg/dL (ref 70–99)
Glucose-Capillary: 98 mg/dL (ref 70–99)

## 2021-06-10 LAB — MAGNESIUM: Magnesium: 2 mg/dL (ref 1.7–2.4)

## 2021-06-10 MED ORDER — CHLORHEXIDINE GLUCONATE CLOTH 2 % EX PADS
6.0000 | MEDICATED_PAD | Freq: Every day | CUTANEOUS | Status: DC
  Administered 2021-06-10: 6 via TOPICAL

## 2021-06-10 MED ORDER — SODIUM CHLORIDE 0.45 % IV SOLN: INTRAVENOUS | Status: DC

## 2021-06-10 MED ORDER — POTASSIUM CHLORIDE IN NACL 20-0.45 MEQ/L-% IV SOLN: INTRAVENOUS | Status: DC

## 2021-06-10 MED ORDER — SODIUM CHLORIDE 0.9% FLUSH
10.0000 mL | Freq: Two times a day (BID) | INTRAVENOUS | Status: DC
  Administered 2021-06-10 – 2021-06-12 (×3): 10 mL

## 2021-06-10 MED ORDER — SODIUM CHLORIDE 0.9% FLUSH
10.0000 mL | INTRAVENOUS | Status: DC | PRN
  Administered 2021-06-12: 20 mL
  Administered 2021-06-13: 10 mL

## 2021-06-10 MED ORDER — SODIUM CHLORIDE 0.45 % IV SOLN
INTRAVENOUS | Status: DC
  Filled 2021-06-10 (×3): qty 1000

## 2021-06-10 NOTE — Progress Notes (Addendum)
? ? ?   ?Subjective: ?Continues to have pain in her abd, but less today than yesterday.  Small amount of flatus, but no BM.  NGT still with bilious output. ? ?Objective: ?Vital signs in last 24 hours: ?Temp:  [97.7 ?F (36.5 ?C)-98.7 ?F (37.1 ?C)] 98.7 ?F (37.1 ?C) (03/03 0515) ?Pulse Rate:  [87-105] 95 (03/03 0515) ?Resp:  [16-20] 18 (03/03 0515) ?BP: (131-157)/(90-99) 146/98 (03/03 0515) ?SpO2:  [97 %-99 %] 97 % (03/03 0515) ?Last BM Date : 06/06/21 ? ?Intake/Output from previous day: ?03/02 0701 - 03/03 0700 ?In: 334.2 [I.V.:334.2] ?Out: 800 [Urine:300; Emesis/NG output:500] ?Intake/Output this shift: ?No intake/output data recorded. ? ?PE: ?Gen:  Alert, NAD, pleasant ?Abd: Soft, some distention, less tender today , NGT in place with some bilious output, 500cc out yesterday ? ? ? ?Lab Results:  ?Recent Labs  ?  06/08/21 ?0411 06/09/21 ?0430  ?WBC 17.1* 10.0  ?HGB 13.5 11.3*  ?HCT 40.5 35.2*  ?PLT 272 271  ? ?BMET ?Recent Labs  ?  06/09/21 ?0430 06/10/21 ?0414  ?NA 139 138  ?K 3.2* 3.4*  ?CL 103 100  ?CO2 28 29  ?GLUCOSE 119* 100*  ?BUN 9 13  ?CREATININE 0.62 0.70  ?CALCIUM 8.9 8.6*  ? ?PT/INR ?No results for input(s): LABPROT, INR in the last 72 hours. ?CMP  ?   ?Component Value Date/Time  ? NA 138 06/10/2021 0414  ? NA 135 01/21/2020 1056  ? K 3.4 (L) 06/10/2021 0414  ? CL 100 06/10/2021 0414  ? CO2 29 06/10/2021 0414  ? GLUCOSE 100 (H) 06/10/2021 0414  ? BUN 13 06/10/2021 0414  ? BUN 12 01/21/2020 1056  ? CREATININE 0.70 06/10/2021 0414  ? CREATININE 0.79 01/21/2021 0817  ? CALCIUM 8.6 (L) 06/10/2021 0414  ? PROT 7.5 06/08/2021 0411  ? ALBUMIN 3.9 06/08/2021 0411  ? AST 25 06/08/2021 0411  ? AST 17 01/21/2021 0817  ? ALT 20 06/08/2021 0411  ? ALT 13 01/21/2021 0817  ? ALKPHOS 103 06/08/2021 0411  ? BILITOT 0.9 06/08/2021 0411  ? BILITOT 0.5 01/21/2021 0817  ? GFRNONAA >60 06/10/2021 0414  ? GFRNONAA >60 01/21/2021 0817  ? GFRAA 108 01/21/2020 1056  ? ?Lipase  ?   ?Component Value Date/Time  ? LIPASE 34  06/07/2021 1818  ? ? ?Studies/Results: ?DG Abd Portable 1V-Small Bowel Obstruction Protocol-initial, 8 hr delay ? ?Result Date: 06/08/2021 ?CLINICAL DATA:  Small-bowel obstruction, oral contrast administration EXAM: PORTABLE ABDOMEN - 1 VIEW COMPARISON:  06/07/2021 FINDINGS: Supine frontal view of the abdomen and pelvis demonstrates persistent gaseous distension of the small bowel measuring up to 6.2 cm in diameter. No oral contrast is identified on this exam. Minimal residual contrast within the bladder from previous CT exam. Calcified uterine fibroid. Enteric catheter coiled over the gastric fundus. IMPRESSION: 1. Oral contrast is not definitively identified on this study. 2. Progressive gaseous distention of the small bowel consistent with obstruction. 3. Enteric catheter overlying the gastric fundus. Electronically Signed   By: Randa Ngo M.D.   On: 06/08/2021 21:45   ? ?Anti-infectives: ?Anti-infectives (From admission, onward)  ? ? None  ? ?  ? ? ? ?Assessment/Plan ?Recurrent SBO ?Hx Crohn's s/p robotic assisted subtotal colectomy with ileo-sigmoid anastomosis 06/27/19 by Dr. Marcello Moores.  On maintenance Lialda/Mesalamine ?- SBO protocol with contrast likely being totally sucked back out ?-GI started steroids yesterday.  Pain a little improved today, but no BMs yet.  Still with a small amount of flatus. ?-cont current treatment plans to  see if we can get her better conservatively.  Rediscussed with patient today. ?-follow up film in am to see if she has made any progress in bowel dilatation etc ? ?FEN - NPO/NGT/IVFs ?VTE - SCDs, heparin (refusing) ?ID - None currently ? ?Breast CA on Chemo - followed by Dr. Sonny Dandy. Hx bilateral nipple sparing mastectomy 11/09/20 by [name]  ?Hx iron deficiency anemia  ? ?I reviewed the last 24 hours of vital signs, I/O, pain scores, MAR, hospitalist and nursing notes. ? ?This care required straightforward level of medical decision making.  ? ? LOS: 3 days  ? ? ?Henreitta Cea  , PA-C ?[facility] ?06/10/2021, 8:59 AM ?Please see Amion for pager number during day hours 7:00am-4:30pm ? ?

## 2021-06-10 NOTE — Progress Notes (Signed)
? ?PROGRESS NOTE ? ? ? [name]  TKW:[id number] DOB: [birth year] DOA: 06/07/2021 ?PCP: Pcp, No ? ? ?Brief Narrative: ?ICESS BERTONI is a 46 y.o. female with a history of Crohn disease s/p colectomy with ileosigmoid anastomosis, breast cancer s/p bilateral mastectomies currently on chemotherapy. Patient presented secondary to abdominal pain, nausea and vomiting. CT abdomen/pelvis significant for evidence of a high grade small bowel obstruction at the site of prior ileosigmoid anastomosis. General surgery consulted for management. NG tube placed and patient is NPO. IV steroids initiated for possible Crohn's flare. ? ? ?Assessment and Plan: ?* SBO (small bowel obstruction) ([place]) ?Recurrent issue. Complicated by history of Crohn disease and prior colectomy with ileosigmoid anastomosis. CT scan on admission significant for high-grade SBO with transition point at site of prior ileosigmoid anastomosis. General surgery consulted on admission. NG tube placed. NPO. ?-General surgery recommendations: Follow-up GI and follow treatment for possible Crohn flare ?-Continue IV fluids ? ?Leucocytosis ?Possibly related to Crohn disease. Afebrile. Leukocytosis is resolved. ? ?Crohn's disease of small and large intestines with complication ([place]) ?GI consulted for concern that Crohn disease is contributing to recurrent SBO. ?-GI recommendations: IV solumedrol ? ?Hypokalemia ?Potassium of 3.2 > 3.4 on BMP. Patient declining IV potassium supplementation. Currently NPO with an NG tube. Advised patient of risks for not repleting potassium. ? ?Malignant neoplasm of upper-outer quadrant of right breast in female, estrogen receptor positive ([place]) ?Patient follows with Dr. Lindi Adie. Currently receiving chemotherapy as an outpatient. She is s/p bilateral mastectomies. ? ? ? ?DVT prophylaxis: SCDs (patient has declined chemical VTE prophylaxis) ?Code Status:   Code Status: Full Code ?Family Communication: Daughter at bedside ?Disposition  Plan: Discharge home pending general surgery/GI recommendations/management ? ? ?Consultants:  ?General surgery ?[facility] ? ?Procedures:  ?NG tube placement ? ?Antimicrobials: ?None  ? ? ?Subjective: ?Patient is unhappy because IV fluids discontinued last night. She also had an issue where NG tube contents spilled in her bed.  ? ?Objective: ?BP (!) 146/98 (BP Location: Left Arm)   Pulse 95   Temp 98.7 ?F (37.1 ?C) (Oral)   Resp 18   Ht 5' 4"  (1.626 m)   Wt 63 kg   SpO2 97%   BMI 23.84 kg/m?  ? ?Examination: ? ?General exam: Appears comfortable. No distress ?Respiratory system: Clear to auscultation. Respiratory effort normal. ?Cardiovascular system: S1 & S2 heard, RRR. ?Gastrointestinal system: Normal bowel sounds. Physical exam deferred per patient request ?Central nervous system: Alert and oriented. No focal neurological deficits. ?Musculoskeletal: No edema. No calf tenderness ?Skin: No cyanosis. No rashes ?Psychiatry: Judgement and insight appear normal. Agitated. ? ? ?Data Reviewed: I have personally reviewed following labs and imaging studies ? ?CBC ?Lab Results  ?Component Value Date  ? WBC 10.0 06/09/2021  ? RBC 4.25 06/09/2021  ? HGB 11.3 (L) 06/09/2021  ? HCT 35.2 (L) 06/09/2021  ? MCV 82.8 06/09/2021  ? MCH 26.6 06/09/2021  ? PLT 271 06/09/2021  ? MCHC 32.1 06/09/2021  ? RDW 14.0 06/09/2021  ? LYMPHSABS 1.1 06/07/2021  ? MONOABS 0.5 06/07/2021  ? EOSABS 0.0 06/07/2021  ? BASOSABS 0.1 06/07/2021  ? ? ? ?Last metabolic panel ?Lab Results  ?Component Value Date  ? NA 138 06/10/2021  ? K 3.4 (L) 06/10/2021  ? CL 100 06/10/2021  ? CO2 29 06/10/2021  ? BUN 13 06/10/2021  ? CREATININE 0.70 06/10/2021  ? GLUCOSE 100 (H) 06/10/2021  ? GFRNONAA >60 06/10/2021  ? GFRAA 108 01/21/2020  ? CALCIUM 8.6 (L)  06/10/2021  ? PHOS 4.1 05/28/2021  ? PROT 7.5 06/08/2021  ? ALBUMIN 3.9 06/08/2021  ? BILITOT 0.9 06/08/2021  ? ALKPHOS 103 06/08/2021  ? AST 25 06/08/2021  ? ALT 20 06/08/2021  ? ANIONGAP 9  06/10/2021  ? ? ?GFR: ?Estimated Creatinine Clearance: 78.3 mL/min (by C-G formula based on SCr of 0.7 mg/dL). ? ?Recent Results (from the past 240 hour(s))  ?Resp Panel by RT-PCR (Flu A&B, Covid) Nasopharyngeal Swab     Status: None  ? Collection Time: 06/07/21  7:36 PM  ? Specimen: Nasopharyngeal Swab; Nasopharyngeal(NP) swabs in vial transport medium  ?Result Value Ref Range Status  ? SARS Coronavirus 2 by RT PCR NEGATIVE NEGATIVE Final  ?  Comment: (NOTE) ?SARS-CoV-2 target nucleic acids are NOT DETECTED. ? ?The SARS-CoV-2 RNA is generally detectable in upper respiratory ?specimens during the acute phase of infection. The lowest ?concentration of SARS-CoV-2 viral copies this assay can detect is ?138 copies/mL. A negative result does not preclude SARS-Cov-2 ?infection and should not be used as the sole basis for treatment or ?other patient management decisions. A negative result may occur with  ?improper specimen collection/handling, submission of specimen other ?than nasopharyngeal swab, presence of viral mutation(s) within the ?areas targeted by this assay, and inadequate number of viral ?copies(<138 copies/mL). A negative result must be combined with ?clinical observations, patient history, and epidemiological ?information. The expected result is Negative. ? ?Fact Sheet for Patients:  ?EntrepreneurPulse.com.au ? ?Fact Sheet for Healthcare Providers:  ?IncredibleEmployment.be ? ?This test is no t yet approved or cleared by the [place] FDA and  ?has been authorized for detection and/or diagnosis of SARS-CoV-2 by ?FDA under an Emergency Use Authorization (EUA). This EUA will remain  ?in effect (meaning this test can be used) for the duration of the ?COVID-19 declaration under Section 564(b)(1) of the Act, 21 ?U.S.C.section 360bbb-3(b)(1), unless the authorization is terminated  ?or revoked sooner.  ? ? ?  ? Influenza A by PCR NEGATIVE NEGATIVE Final  ? Influenza B by PCR  NEGATIVE NEGATIVE Final  ?  Comment: (NOTE) ?The Xpert Xpress SARS-CoV-2/FLU/RSV plus assay is intended as an aid ?in the diagnosis of influenza from Nasopharyngeal swab specimens and ?should not be used as a sole basis for treatment. Nasal washings and ?aspirates are unacceptable for Xpert Xpress SARS-CoV-2/FLU/RSV ?testing. ? ?Fact Sheet for Patients: ?EntrepreneurPulse.com.au ? ?Fact Sheet for Healthcare Providers: ?IncredibleEmployment.be ? ?This test is not yet approved or cleared by the [place] FDA and ?has been authorized for detection and/or diagnosis of SARS-CoV-2 by ?FDA under an Emergency Use Authorization (EUA). This EUA will remain ?in effect (meaning this test can be used) for the duration of the ?COVID-19 declaration under Section 564(b)(1) of the Act, 21 U.S.C. ?section 360bbb-3(b)(1), unless the authorization is terminated or ?revoked. ? ?Performed at [facility], [address] ?[place], [place] [address] ?  ?  ? ? ?Radiology Studies: ?DG Abd Portable 1V-Small Bowel Obstruction Protocol-initial, 8 hr delay ? ?Result Date: 06/08/2021 ?CLINICAL DATA:  Small-bowel obstruction, oral contrast administration EXAM: PORTABLE ABDOMEN - 1 VIEW COMPARISON:  06/07/2021 FINDINGS: Supine frontal view of the abdomen and pelvis demonstrates persistent gaseous distension of the small bowel measuring up to 6.2 cm in diameter. No oral contrast is identified on this exam. Minimal residual contrast within the bladder from previous CT exam. Calcified uterine fibroid. Enteric catheter coiled over the gastric fundus. IMPRESSION: 1. Oral contrast is not definitively identified on this study. 2. Progressive gaseous distention of  the small bowel consistent with obstruction. 3. Enteric catheter overlying the gastric fundus. Electronically Signed   By: Randa Ngo M.D.   On: 06/08/2021 21:45   ? ? ? LOS: 3 days  ? ? ?Cordelia Poche, MD ?[facility] ?06/10/2021,  12:54 PM ? ? ?If 7PM-7AM, please contact night-coverage ?[email or web address] ? ?

## 2021-06-10 NOTE — Progress Notes (Signed)
? ? ? ?[name] ? ?CC:  Crohn's and SBO ? ?Subjective:  Upset, agitated this morning.  Refused her potassium because it makes her feel unwell.  Upset that everyone keeps pressing on her belly.  Passing flatus but no BM.  Pain is a little better.  Still a lot of bilious output in the NG canister.   ? ?Objective:  ?Vital signs in last 24 hours: ?Temp:  [97.7 ?F (36.5 ?C)-98.7 ?F (37.1 ?C)] 98.7 ?F (37.1 ?C) (03/03 0515) ?Pulse Rate:  [87-105] 95 (03/03 0515) ?Resp:  [16-20] 18 (03/03 0515) ?BP: (131-157)/(90-99) 146/98 (03/03 0515) ?SpO2:  [97 %-99 %] 97 % (03/03 0515) ?Last BM Date : 06/06/21 ?General:  Alert, Well-developed, in NAD; agitated this morning. ?Heart:  Regular rate and rhythm; no murmurs ?Pulm:  CTAB.  No W/R/R. ?Abdomen:  Soft, somewhat distended.  BS sparse and quiet.  Abdomen not palpated at the patient's request. ?Extremities:  Without edema. ?Neurologic:  Alert and oriented x 4;  grossly normal neurologically. ?Psych:  Alert and cooperative. Normal mood and affect. ? ?Intake/Output from previous day: ?03/02 0701 - 03/03 0700 ?In: 334.2 [I.V.:334.2] ?Out: 800 [Urine:300; Emesis/NG output:500] ? ?Lab Results: ?Recent Labs  ?  06/07/21 ?1818 06/07/21 ?1835 06/08/21 ?0411 06/09/21 ?0430  ?WBC 21.9*  --  17.1* 10.0  ?HGB 14.0 15.6* 13.5 11.3*  ?HCT 42.8 46.0 40.5 35.2*  ?PLT 343  --  272 271  ? ?BMET ?Recent Labs  ?  06/08/21 ?0411 06/09/21 ?0430 06/10/21 ?8299  ?NA 136 139 138  ?K 4.0 3.2* 3.4*  ?CL 105 103 100  ?CO2 20* 28 29  ?GLUCOSE 162* 119* 100*  ?BUN 12 9 13   ?CREATININE 0.69 0.62 0.70  ?CALCIUM 9.2 8.9 8.6*  ? ?LFT ?Recent Labs  ?  06/08/21 ?0411  ?PROT 7.5  ?ALBUMIN 3.9  ?AST 25  ?ALT 20  ?ALKPHOS 103  ?BILITOT 0.9  ? ?DG Abd Portable 1V-Small Bowel Obstruction Protocol-initial, 8 hr delay ? ?Result Date: 06/08/2021 ?CLINICAL DATA:  Small-bowel obstruction, oral contrast administration EXAM: PORTABLE ABDOMEN - 1 VIEW COMPARISON:  06/07/2021 FINDINGS: Supine frontal  view of the abdomen and pelvis demonstrates persistent gaseous distension of the small bowel measuring up to 6.2 cm in diameter. No oral contrast is identified on this exam. Minimal residual contrast within the bladder from previous CT exam. Calcified uterine fibroid. Enteric catheter coiled over the gastric fundus. IMPRESSION: 1. Oral contrast is not definitively identified on this study. 2. Progressive gaseous distention of the small bowel consistent with obstruction. 3. Enteric catheter overlying the gastric fundus. Electronically Signed   By: Randa Ngo M.D.   On: 06/08/2021 21:45   ? ?Assessment / Plan: ?1.  High-grade distal small bowel obstruction with transition point in the region of ileocolic anastomosis: Last flex sig 01/27/2021 showed no focal pathology, anastomosis appeared patent, mild scattered inflammatory changes of the ileum of unclear significance, biopsies did show continued colitis and patient was supposed to have follow-up in our clinic, did have leukocytosis but that has resolved today; consider Crohn's versus fibrotic stricture vs adhesions causing recurrent small bowel obstructions.   ?2.  Crohn's colitis status post subtotal colectomy with ileal to sigmoid anastomosis in March 2021 by Dr. Marcello Moores: Had been maintained on Mesalamine 4.8 g daily, previous diffuse inflammation in the distal rectum 3/22 was thought due to drug-induced from Docetaxil.  CRP currently 3.3. ?3.  Breast cancer status post bilateral nipple sparing mastectomy 11/09/2020 ?4.  Hypokalemia:  K+  3.4 today ?  ?-Trial of IV steroids to see if that helps to open her up before proceeding with another surgery.  Started solumedrol 40 mg IV daily. ?-Otherwise supportive care for SBO with NGT, IVFs, antiemetics, etc. ?-Correct hypokalemia to optimize gut function. ?-She said that she would try to walk around today.  I think that she felt better after conversation with Dr. Lonny Prude and myself. ? ? LOS: 3 days  ? ?[name]. [name]   06/10/2021, 9:12 AM ? ?  ?

## 2021-06-11 ENCOUNTER — Inpatient Hospital Stay (HOSPITAL_COMMUNITY): Payer: Managed Care, Other (non HMO)

## 2021-06-11 LAB — GLUCOSE, CAPILLARY
Glucose-Capillary: 80 mg/dL (ref 70–99)
Glucose-Capillary: 82 mg/dL (ref 70–99)
Glucose-Capillary: 90 mg/dL (ref 70–99)

## 2021-06-11 LAB — BASIC METABOLIC PANEL
Anion gap: 8 (ref 5–15)
BUN: 16 mg/dL (ref 6–20)
CO2: 29 mmol/L (ref 22–32)
Calcium: 8.4 mg/dL — ABNORMAL LOW (ref 8.9–10.3)
Chloride: 97 mmol/L — ABNORMAL LOW (ref 98–111)
Creatinine, Ser: 0.65 mg/dL (ref 0.44–1.00)
GFR, Estimated: >60 mL/min (ref >60)
Glucose, Bld: 80 mg/dL (ref 70–99)
Potassium: 3.4 mmol/L — ABNORMAL LOW (ref 3.5–5.1)
Sodium: 134 mmol/L — ABNORMAL LOW (ref 135–145)

## 2021-06-11 LAB — CBC
HCT: 31.7 % — ABNORMAL LOW (ref 36.0–46.0)
Hemoglobin: 10.3 g/dL — ABNORMAL LOW (ref 12.0–15.0)
MCH: 27 pg (ref 26.0–34.0)
MCHC: 32.5 g/dL (ref 30.0–36.0)
MCV: 83 fL (ref 80.0–100.0)
Platelets: 241 10*3/uL (ref 150–400)
RBC: 3.82 MIL/uL — ABNORMAL LOW (ref 3.87–5.11)
RDW: 13.8 % (ref 11.5–15.5)
WBC: 8 10*3/uL (ref 4.0–10.5)
nRBC: 0 % (ref 0.0–0.2)

## 2021-06-11 LAB — PREALBUMIN: Prealbumin: 19.2 mg/dL (ref 18–38)

## 2021-06-11 MED ORDER — POTASSIUM CHLORIDE 10 MEQ/100ML IV SOLN
10.0000 meq | INTRAVENOUS | Status: AC
  Administered 2021-06-11 (×4): 10 meq via INTRAVENOUS
  Filled 2021-06-11 (×3): qty 100

## 2021-06-11 MED ORDER — SODIUM CHLORIDE 0.45 % IV SOLN: INTRAVENOUS | Status: DC

## 2021-06-11 NOTE — Progress Notes (Addendum)
? ?  Subjective/Chief Complaint: ?No flatus or bm yet ? ? ?Objective: ?Vital signs in last 24 hours: ?Temp:  [97.7 ?F (36.5 ?C)-98.6 ?F (37 ?C)] 98.2 ?F (36.8 ?C) (03/04 0505) ?Pulse Rate:  [88-95] 95 (03/04 0505) ?Resp:  [17-18] 18 (03/04 0505) ?BP: (132-146)/(89-97) 136/97 (03/04 0505) ?SpO2:  [98 %] 98 % (03/04 0505) ?Last BM Date : 06/06/21 ? ?Intake/Output from previous day: ?03/03 0701 - 03/04 0700 ?In: 802.1 [P.O.:300; I.V.:502.1] ?Out: 6789 [Urine:1400; Emesis/NG output:1150] ?Intake/Output this shift: ?No intake/output data recorded. ? ?Gen:  Alert, NAD, pleasant ?Abd: Soft, some distention, minimally tender well healed incision ?  ? ?Lab Results:  ?Recent Labs  ?  06/09/21 ?0430 06/11/21 ?3810  ?WBC 10.0 8.0  ?HGB 11.3* 10.3*  ?HCT 35.2* 31.7*  ?PLT 271 241  ? ?BMET ?Recent Labs  ?  06/10/21 ?0414 06/11/21 ?1751  ?NA 138 134*  ?K 3.4* 3.4*  ?CL 100 97*  ?CO2 29 29  ?GLUCOSE 100* 80  ?BUN 13 16  ?CREATININE 0.70 0.65  ?CALCIUM 8.6* 8.4*  ? ?PT/INR ?No results for input(s): LABPROT, INR in the last 72 hours. ?ABG ?No results for input(s): PHART, HCO3 in the last 72 hours. ? ?Invalid input(s): PCO2, PO2 ? ?Studies/Results: ?DG Abd Portable 1V ? ?Result Date: 06/11/2021 ?CLINICAL DATA:  46 year old female small-bowel obstruction. EXAM: PORTABLE ABDOMEN - 1 VIEW COMPARISON:  CT Abdomen and Pelvis 06/07/2021. KUB 06/08/2021. FINDINGS: Portable AP supine view at 0608 hours. Enteric tube remains looped at the level of the stomach. Improved but not normalized bowel gas pattern from 3 days ago with continued gas distended small bowel loops now about 5 cm diameter. Prior subtotal colectomy. Stable abdominal visceral contours. No acute osseous abnormality identified. IMPRESSION: 1. Improved but not normalized bowel gas pattern since 06/08/2021. Ongoing at least partial SBO suspected. 2. Enteric tube remains within the stomach. Electronically Signed   By: Genevie Ann M.D.   On: 06/11/2021 08:26    ? ?Anti-infectives: ?Anti-infectives (From admission, onward)  ? ? None  ? ?  ? ? ?Assessment/Plan: ?Recurrent SBO ?Hx Crohn's s/p robotic assisted subtotal colectomy with ileo-sigmoid anastomosis 06/27/19 by Dr. Marcello Moores.  On maintenance Lialda/Mesalamine ?- SBO protocol with contrast likely being totally sucked back out ?-GI started steroids 2 days ago, no real bowel function ?-cont current treatment plans to see if we can get her better conservatively.  Rediscussed with patient today. ?-follow up film in am  ?-would consider another flex sig prior to any surgery ? ?FEN - NPO/NGT/IVFs ?VTE - SCDs, heparin (refusing) ?ID - None currently ?  ?Breast CA on antiher2 therapy (not chemotherapy) - followed by Dr. Lindi Adie. Hx bilateral nipple sparing mastectomy 11/09/20 by [name]  ?Hx iron deficiency anemia  ?  ?I reviewed the last 24 hours of vital signs, I/O, pain scores, MAR, hospitalist and nursing notes. ?  ?This care required straightforward level of medical decision making.  ? ?[name] ?06/11/2021 ? ?

## 2021-06-11 NOTE — Progress Notes (Signed)
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 Patient has been on Lialda for her Crohn's but has never been tried on biologics. She presented with a SBO and is being treated with NG decompression and steroids. She had similar admissions last month and in 01/2021. A flexible sigmoidoscopy done in  October last year revealed erosions in the terminal ileum with a healthy ileo-sigmoid anastamosis. She denies having any abdominal pain or nausea; has not passed any flatus.  ? ?Objective: ?Vital signs in last 24 hours: ?Temp:  [97.7 ?F (36.5 ?C)-98.6 ?F (37 ?C)] 98.2 ?F (36.8 ?C) (03/04 0505) ?Pulse Rate:  [88-95] 95 (03/04 0505) ?Resp:  [17-18] 18 (03/04 0505) ?BP: (132-146)/(89-97) 136/97 (03/04 0505) ?SpO2:  [98 %] 98 % (03/04 0505) ?Last BM Date : 06/06/21 ? ?Intake/Output from previous day: ?03/03 0701 - 03/04 0700 ?In: 802.1 [P.O.:300; I.V.:502.1] ?Out: 2778 [Urine:1400; Emesis/NG output:1150] ?Intake/Output this shift: ?No intake/output data recorded. ? ?General appearance: alert, cooperative, appears stated age, and no distress ?Resp: clear to auscultation bilaterally ?Cardio: regular rate and rhythm, S1, S2 normal, no murmur, click, rub or gallop ?GI: soft, non-tender; hypoactive bowel sounds normal; no masses,  no organomegaly ? ?Lab Results: ?Recent Labs  ?  06/09/21 ?0430 06/11/21 ?2423  ?WBC 10.0 8.0  ?HGB 11.3* 10.3*  ?HCT 35.2* 31.7*  ?PLT 271 241  ? ?BMET ?Recent Labs  ?  06/09/21 ?0430 06/10/21 ?0414 06/11/21 ?5361  ?NA 139 138 134*  ?K 3.2* 3.4* 3.4*  ?CL 103 100 97*  ?CO2 _0 ?GLUCOSE 119* 100* 80  ?BUN _1 ?CREATININE 0.62 0.70  0.65  ?CALCIUM 8.9 8.6* 8.4*  ? ?Studies/Results: ?DG Abd Portable 1V ? ?Result Date: 06/11/2021 ?CLINICAL DATA:  46 year old female small-bowel obstruction. EXAM: PORTABLE ABDOMEN - 1 VIEW COMPARISON:  CT Abdomen and Pelvis 06/07/2021. KUB 06/08/2021. FINDINGS: Portable AP supine view at 0608 hours. Enteric tube remains looped at the level of the stomach. Improved but not normalized bowel gas pattern from 3 days ago with continued gas distended small bowel loops now about 5 cm diameter. Prior subtotal colectomy. Stable abdominal visceral contours. No acute osseous abnormality identified. IMPRESSION: 1. Improved but not normalized bowel gas pattern since 06/08/2021. Ongoing at least partial SBO suspected. 2. Enteric tube remains within the stomach. Electronically Signed   By: Genevie Ann M.D.   On: 06/11/2021 08:26   ? ?Medications: I have reviewed the patient's current medications. ?Prior to Admission:  ?Medications Prior to Admission  ?Medication Sig Dispense Refill Last Dose  ? acetaminophen (TYLENOL) 500 MG tablet Take 500-1,000 mg by mouth every 6 (six) hours as needed for mild pain.   unk  ? lidocaine-prilocaine (EMLA) cream Apply 1 application topically as needed (Prior to port access). 30 g 2 unk  ? mesalamine (LIALDA) 1.2 g EC tablet Take 2 tablets (2.4 g total) by mouth 2 (two) times daily with a meal. 120 tablet 11 06/06/2021  ? Multiple Vitamins-Minerals (MULTIVITAMIN WITH MINERALS) tablet Take 1 tablet by mouth daily.   06/06/2021  ? polyethylene glycol (MIRALAX) 17 g packet Take 17 g by mouth daily as needed. (Patient taking differently: Take 17 g by mouth daily as  needed for mild constipation.) 14 each 0 unk  ? ?Scheduled: ? Chlorhexidine Gluconate Cloth  6 each Topical Daily  ? mesalamine  4.8 g Oral Q breakfast  ? methylPREDNISolone (SOLU-MEDROL) injection  40 mg Intravenous Daily  ? sodium chloride flush  10-40 mL Intracatheter Q12H  ? ?Continuous: ? sodium chloride 75 mL/hr at 06/11/21 1450  ? potassium  chloride 10 mEq (06/11/21 1453)  ? ?Assessment/Plan: ?1) High grade distal SBO with a transition pint at the ileocolonic anastamosis-on IV steroids and NG decompression-SBO seems to have improved on imaging buit has not resolved clinically. We may need to consider other treatment options besides Lialda as she has SB involvement with Crohn's as well. ?2) Crohn's colitis s/p subtotal colectomy with ileo-sigmoid anastamosis-06/2019 ?3) Stage III B breast cancer s/p bilateral mastectomies in 11/2020-completing Herceptin Prejenta infusions on 07/08/21. ?.  ? LOS: 4 days  ? ?Lilliemae Fruge ?06/11/2021, 9:02 AM ? ? ?

## 2021-06-11 NOTE — Progress Notes (Signed)
PROGRESS NOTE    [name]  PRF:[id number] DOB: 1975/06/12 DOA: 06/07/2021 PCP: Pcp, No   Brief Narrative: [name] is a 46 y.o. female with a history of Crohn disease s/p colectomy with ileosigmoid anastomosis, breast cancer s/p bilateral mastectomies currently on chemotherapy. Patient presented secondary to abdominal pain, nausea and vomiting. CT abdomen/pelvis significant for evidence of a high grade small bowel obstruction at the site of prior ileosigmoid anastomosis. General surgery consulted for management. NG tube placed and patient is NPO. IV steroids initiated for possible Crohn's flare.   Assessment and Plan: * SBO (small bowel obstruction) (HCC) Recurrent issue. Complicated by history of Crohn disease and prior colectomy with ileosigmoid anastomosis. CT scan on admission significant for high-grade SBO with transition point at site of prior ileosigmoid anastomosis. General surgery consulted on admission. NG tube placed. NPO. -General surgery recommendations: Treat possible Crohn's flare and revisit need for possible surgery if treatment fails -Continue IV fluids  Leucocytosis Possibly related to Crohn disease. Afebrile. Leukocytosis is resolved.  Crohn's disease of small and large intestines with complication ([place]) GI consulted for concern that Crohn disease is contributing to recurrent SBO. -GI recommendations: IV solumedrol  Hypokalemia Potassium of 3.2 > 3.4 > 3.4 on BMP. Patient initially declining IV potassium supplementation. Currently NPO with an NG tube. Patient now okay with receiving potassium via IV. -potassium IV 10 meq x4 runs  Malignant neoplasm of upper-outer quadrant of right breast in female, estrogen receptor positive ([place]) Patient follows with Dr. Lindi Adie. Currently receiving chemotherapy as an outpatient. She is s/p bilateral mastectomies.    DVT prophylaxis: SCDs (patient has declined chemical VTE prophylaxis) Code Status:   Code  Status: Full Code Family Communication: None at bedside Disposition Plan: Discharge home pending general surgery/GI recommendations/management   Consultants:  General surgery [facility]  Procedures:  NG tube placement  Antimicrobials: None    Subjective: No flatus or bowel movement still. Abdominal cramping is still episodic and not much better. Patient is open to receiving potassium via IV today just in case it will help with her bowel function.  Objective: BP (!) 142/93 (BP Location: Left Arm)    Pulse (!) 105    Temp 98.6 F (37 C) (Oral)    Resp 18    Ht 5' 4"  (1.626 m)    Wt 63 kg    SpO2 98%    BMI 23.84 kg/m   Examination:  General exam: Appears calm and comfortable  Respiratory system: Clear to auscultation. Respiratory effort normal. Cardiovascular system: S1 & S2 heard, RRR. No murmurs, rubs, gallops or clicks. Gastrointestinal system: Slightly decreased, but present bowel sounds heard. Palpation deferred per patient preference Central nervous system: Alert and oriented. No focal neurological deficits. Musculoskeletal: No edema. No calf tenderness Skin: No cyanosis. No rashes Psychiatry: Judgement and insight appear normal. Mood & affect appropriate.    Data Reviewed: I have personally reviewed following labs and imaging studies  CBC Lab Results  Component Value Date   WBC 8.0 06/11/2021   RBC 3.82 (L) 06/11/2021   HGB 10.3 (L) 06/11/2021   HCT 31.7 (L) 06/11/2021   MCV 83.0 06/11/2021   MCH 27.0 06/11/2021   PLT 241 06/11/2021   MCHC 32.5 06/11/2021   RDW 13.8 06/11/2021   LYMPHSABS 1.1 06/07/2021   MONOABS 0.5 06/07/2021   EOSABS 0.0 06/07/2021   BASOSABS 0.1 93/57/0177     Last metabolic panel Lab Results  Component Value Date   NA 134 (L)  06/11/2021   K 3.4 (L) 06/11/2021   CL 97 (L) 06/11/2021   CO2 29 06/11/2021   BUN 16 06/11/2021   CREATININE 0.65 06/11/2021   GLUCOSE 80 06/11/2021   GFRNONAA >60 06/11/2021   GFRAA  108 01/21/2020   CALCIUM 8.4 (L) 06/11/2021   PHOS 4.1 05/28/2021   PROT 7.5 06/08/2021   ALBUMIN 3.9 06/08/2021   BILITOT 0.9 06/08/2021   ALKPHOS 103 06/08/2021   AST 25 06/08/2021   ALT 20 06/08/2021   ANIONGAP 8 06/11/2021    GFR: Estimated Creatinine Clearance: 78.3 mL/min (by C-G formula based on SCr of 0.65 mg/dL).  Recent Results (from the past 240 hour(s))  Resp Panel by RT-PCR (Flu A&B, Covid) Nasopharyngeal Swab     Status: None   Collection Time: 06/07/21  7:36 PM   Specimen: Nasopharyngeal Swab; Nasopharyngeal(NP) swabs in vial transport medium  Result Value Ref Range Status   SARS Coronavirus 2 by RT PCR NEGATIVE NEGATIVE Final    Comment: (NOTE) SARS-CoV-2 target nucleic acids are NOT DETECTED.  The SARS-CoV-2 RNA is generally detectable in upper respiratory specimens during the acute phase of infection. The lowest concentration of SARS-CoV-2 viral copies this assay can detect is 138 copies/mL. A negative result does not preclude SARS-Cov-2 infection and should not be used as the sole basis for treatment or other patient management decisions. A negative result may occur with  improper specimen collection/handling, submission of specimen other than nasopharyngeal swab, presence of viral mutation(s) within the areas targeted by this assay, and inadequate number of viral copies(<138 copies/mL). A negative result must be combined with clinical observations, patient history, and epidemiological information. The expected result is Negative.  Fact Sheet for Patients:  EntrepreneurPulse.com.au  Fact Sheet for Healthcare Providers:  IncredibleEmployment.be  This test is no t yet approved or cleared by the [place] FDA and  has been authorized for detection and/or diagnosis of SARS-CoV-2 by FDA under an Emergency Use Authorization (EUA). This EUA will remain  in effect (meaning this test can be used) for the duration of  the COVID-19 declaration under Section 564(b)(1) of the Act, 21 U.S.C.section 360bbb-3(b)(1), unless the authorization is terminated  or revoked sooner.       Influenza A by PCR NEGATIVE NEGATIVE Final   Influenza B by PCR NEGATIVE NEGATIVE Final    Comment: (NOTE) The Xpert Xpress SARS-CoV-2/FLU/RSV plus assay is intended as an aid in the diagnosis of influenza from Nasopharyngeal swab specimens and should not be used as a sole basis for treatment. Nasal washings and aspirates are unacceptable for Xpert Xpress SARS-CoV-2/FLU/RSV testing.  Fact Sheet for Patients: EntrepreneurPulse.com.au  Fact Sheet for Healthcare Providers: IncredibleEmployment.be  This test is not yet approved or cleared by the [place] FDA and has been authorized for detection and/or diagnosis of SARS-CoV-2 by FDA under an Emergency Use Authorization (EUA). This EUA will remain in effect (meaning this test can be used) for the duration of the COVID-19 declaration under Section 564(b)(1) of the Act, 21 U.S.C. section 360bbb-3(b)(1), unless the authorization is terminated or revoked.  Performed at [facility], [place] [address] [place], [place] [address]       Radiology Studies: DG Abd Portable 1V  Result Date: 06/11/2021 CLINICAL DATA:  46 year old female small-bowel obstruction. EXAM: PORTABLE ABDOMEN - 1 VIEW COMPARISON:  CT Abdomen and Pelvis 06/07/2021. KUB 06/08/2021. FINDINGS: Portable AP supine view at 0608 hours. Enteric tube remains looped at the level of the stomach. Improved but not  normalized bowel gas pattern from 3 days ago with continued gas distended small bowel loops now about 5 cm diameter. Prior subtotal colectomy. Stable abdominal visceral contours. No acute osseous abnormality identified. IMPRESSION: 1. Improved but not normalized bowel gas pattern since 06/08/2021. Ongoing at least partial SBO suspected. 2. Enteric tube remains  within the stomach. Electronically Signed   By: Genevie Ann M.D.   On: 06/11/2021 08:26      LOS: 4 days    Cordelia Poche, MD [facility] 06/11/2021, 2:12 PM   If 7PM-7AM, please contact night-coverage [email or web address]

## 2021-06-12 ENCOUNTER — Inpatient Hospital Stay (HOSPITAL_COMMUNITY): Payer: Managed Care, Other (non HMO)

## 2021-06-12 LAB — BASIC METABOLIC PANEL
Anion gap: 12 (ref 5–15)
BUN: 19 mg/dL (ref 6–20)
CO2: 27 mmol/L (ref 22–32)
Calcium: 8.6 mg/dL — ABNORMAL LOW (ref 8.9–10.3)
Chloride: 94 mmol/L — ABNORMAL LOW (ref 98–111)
Creatinine, Ser: 0.66 mg/dL (ref 0.44–1.00)
GFR, Estimated: >60 mL/min (ref >60)
Glucose, Bld: 69 mg/dL — ABNORMAL LOW (ref 70–99)
Potassium: 3.4 mmol/L — ABNORMAL LOW (ref 3.5–5.1)
Sodium: 133 mmol/L — ABNORMAL LOW (ref 135–145)

## 2021-06-12 LAB — GLUCOSE, CAPILLARY
Glucose-Capillary: 74 mg/dL (ref 70–99)
Glucose-Capillary: 103 mg/dL — ABNORMAL HIGH (ref 70–99)
Glucose-Capillary: 132 mg/dL — ABNORMAL HIGH (ref 70–99)

## 2021-06-12 LAB — MAGNESIUM: Magnesium: 2 mg/dL (ref 1.7–2.4)

## 2021-06-12 MED ORDER — POTASSIUM CHLORIDE CRYS ER 20 MEQ PO TBCR
40.0000 meq | EXTENDED_RELEASE_TABLET | ORAL | Status: AC
  Administered 2021-06-12 (×2): 40 meq via ORAL
  Filled 2021-06-12 (×2): qty 2

## 2021-06-12 NOTE — Progress Notes (Signed)
MD notified that pt is refusing her CHG wipes (has PAC accessed) and her daily UC med Lialda. ?

## 2021-06-12 NOTE — Progress Notes (Signed)
CROSS [facility]OVER [facility] ?Subjective: ?Since I last evaluated the patient, she seems to be doing much better. She had 2 BMs last night and has been passing flatus this morning.  She is anxious to get the NG tube out.  A family member at the bedside is requesting "something to eat as a blood sugars are dropping". ?Objective: ?Vital signs in last 24 hours: ?Temp:  [98.4 ?F (36.9 ?C)-99 ?F (37.2 ?C)] 98.4 ?F (36.9 ?C) (03/05 4098) ?Pulse Rate:  [103-105] 105 (03/05 0639) ?Resp:  [16-18] 16 (03/05 1191) ?BP: (135-142)/(91-100) 135/91 (03/05 4782) ?SpO2:  [98 %-99 %] 99 % (03/05 0639) ?Last BM Date : 06/12/21 ? ?Intake/Output from previous day: ?03/04 0701 - 03/05 0700 ?In: 685 [I.V.:685] ?Out: 1701 [Emesis/NG output:1700; Stool:1] ?Intake/Output this shift: ?No intake/output data recorded. ? ?General appearance: alert, cooperative, appears stated age, fatigued, and no distress; NG tube in plac' ?Resp: clear to auscultation bilaterally ?Cardio: regular rate and rhythm, S1, S2 normal, no murmur, click, rub or gallop ?GI: soft, non-tender; bowel sounds normal; no masses,  no organomegaly ? ?Lab Results: ?Recent Labs  ?  06/11/21 ?9562  ?WBC 8.0  ?HGB 10.3*  ?HCT 31.7*  ?PLT 241  ? ?BMET ?Recent Labs  ?  06/10/21 ?0414 06/11/21 ?1308 06/12/21 ?0319  ?NA 138 134* 133*  ?K 3.4* 3.4* 3.4*  ?CL 100 97* 94*  ?CO2 29 29 27   ?GLUCOSE 100* 80 69*  ?BUN 13 16 19   ?CREATININE 0.70 0.65 0.66  ?CALCIUM 8.6* 8.4* 8.6*  ? ?Studies/Results: ?DG Abd Portable 1V ? ?Result Date: 06/12/2021 ?CLINICAL DATA:  Evaluate small bowel obstruction. EXAM: PORTABLE ABDOMEN - 1 VIEW COMPARISON:  06/11/2021 FINDINGS: The enteric tube is again noted with tip and side port well below the GE junction. The tip projects over the body of the stomach. Persistent but improved gaseous distension of small bowel loops. Small bowel loops measure up to 4.2 cm versus 5.2 cm previously. IMPRESSION: Persistent but improved gaseous distension of small bowel loops. Electronically  Signed   By: Kerby Moors M.D.   On: 06/12/2021 08:23  ? ?DG Abd Portable 1V ? ?Result Date: 06/11/2021 ?CLINICAL DATA:  45 year old female small-bowel obstruction. EXAM: PORTABLE ABDOMEN - 1 VIEW COMPARISON:  CT Abdomen and Pelvis 06/07/2021. KUB 06/08/2021. FINDINGS: Portable AP supine view at 0608 hours. Enteric tube remains looped at the level of the stomach. Improved but not normalized bowel gas pattern from 3 days ago with continued gas distended small bowel loops now about 5 cm diameter. Prior subtotal colectomy. Stable abdominal visceral contours. No acute osseous abnormality identified. IMPRESSION: 1. Improved but not normalized bowel gas pattern since 06/08/2021. Ongoing at least partial SBO suspected. 2. Enteric tube remains within the stomach. Electronically Signed   By: Genevie Ann M.D.   On: 06/11/2021 08:26   ? ?Medications: I have reviewed the patient's current medications. ?Prior to Admission:  ?Medications Prior to Admission  ?Medication Sig Dispense Refill Last Dose  ? acetaminophen (TYLENOL) 500 MG tablet Take 500-1,000 mg by mouth every 6 (six) hours as needed for mild pain.   unk  ? lidocaine-prilocaine (EMLA) cream Apply 1 application topically as needed (Prior to port access). 30 g 2 unk  ? mesalamine (LIALDA) 1.2 g EC tablet Take 2 tablets (2.4 g total) by mouth 2 (two) times daily with a meal. 120 tablet 11 06/06/2021  ? Multiple Vitamins-Minerals (MULTIVITAMIN WITH MINERALS) tablet Take 1 tablet by mouth daily.   06/06/2021  ? polyethylene glycol (MIRALAX) 17  g packet Take 17 g by mouth daily as needed. (Patient taking differently: Take 17 g by mouth daily as needed for mild constipation.) 14 each 0 unk  ? ?Scheduled: ? Chlorhexidine Gluconate Cloth  6 each Topical Daily  ? mesalamine  4.8 g Oral Q breakfast  ? methylPREDNISolone (SOLU-MEDROL) injection  40 mg Intravenous Daily  ? sodium chloride flush  10-40 mL Intracatheter Q12H  ? ?Continuous: ? ?Assessment/Plan: ?1) High grade distal SBO  with a transition point at the ileocolonic anastamosis-on IV steroids and NG decompression-SBO seems to havei resolved-we will remove the NG tube and start the patient on clear liquids and advance as tolerated. ?2) Crohn's colitis s/p subtotal colectomy with ileo-sigmoid anastamosis-06/2019 ?3) Stage III B breast cancer s/p bilateral mastectomies in 11/2020-completing Herceptin Prejenta infusions on 07/08/21. ?.  ? ? ? ? LOS: 5 days  ? ?[name] ?06/12/2021, 8:36 AM ? ? ?

## 2021-06-12 NOTE — Progress Notes (Signed)
? ?  Subjective/Chief Complaint: ?Multiple bms and flatus, feels much better ? ? ?Objective: ?Vital signs in last 24 hours: ?Temp:  [98.4 ?F (36.9 ?C)-99 ?F (37.2 ?C)] 98.4 ?F (36.9 ?C) (03/05 4315) ?Pulse Rate:  [103-105] 105 (03/05 0639) ?Resp:  [16-18] 16 (03/05 4008) ?BP: (135-142)/(91-100) 135/91 (03/05 6761) ?SpO2:  [98 %-99 %] 99 % (03/05 0639) ?Last BM Date : 06/12/21 ? ?Intake/Output from previous day: ?03/04 0701 - 03/05 0700 ?In: 685 [I.V.:685] ?Out: 1701 [Emesis/NG output:1700; Stool:1] ?Intake/Output this shift: ?No intake/output data recorded. ? ?General nad ?Abd soft nontender nondistended ? ?Lab Results:  ?Recent Labs  ?  06/11/21 ?9509  ?WBC 8.0  ?HGB 10.3*  ?HCT 31.7*  ?PLT 241  ? ?BMET ?Recent Labs  ?  06/11/21 ?3267 06/12/21 ?0319  ?NA 134* 133*  ?K 3.4* 3.4*  ?CL 97* 94*  ?CO2 29 27  ?GLUCOSE 80 69*  ?BUN 16 19  ?CREATININE 0.65 0.66  ?CALCIUM 8.4* 8.6*  ? ?PT/INR ?No results for input(s): LABPROT, INR in the last 72 hours. ?ABG ?No results for input(s): PHART, HCO3 in the last 72 hours. ? ?Invalid input(s): PCO2, PO2 ? ?Studies/Results: ?DG Abd Portable 1V ? ?Result Date: 06/12/2021 ?CLINICAL DATA:  Evaluate small bowel obstruction. EXAM: PORTABLE ABDOMEN - 1 VIEW COMPARISON:  06/11/2021 FINDINGS: The enteric tube is again noted with tip and side port well below the GE junction. The tip projects over the body of the stomach. Persistent but improved gaseous distension of small bowel loops. Small bowel loops measure up to 4.2 cm versus 5.2 cm previously. IMPRESSION: Persistent but improved gaseous distension of small bowel loops. Electronically Signed   By: Kerby Moors M.D.   On: 06/12/2021 08:23  ? ?DG Abd Portable 1V ? ?Result Date: 06/11/2021 ?CLINICAL DATA:  46 year old female small-bowel obstruction. EXAM: PORTABLE ABDOMEN - 1 VIEW COMPARISON:  CT Abdomen and Pelvis 06/07/2021. KUB 06/08/2021. FINDINGS: Portable AP supine view at 0608 hours. Enteric tube remains looped at the level of the  stomach. Improved but not normalized bowel gas pattern from 3 days ago with continued gas distended small bowel loops now about 5 cm diameter. Prior subtotal colectomy. Stable abdominal visceral contours. No acute osseous abnormality identified. IMPRESSION: 1. Improved but not normalized bowel gas pattern since 06/08/2021. Ongoing at least partial SBO suspected. 2. Enteric tube remains within the stomach. Electronically Signed   By: Genevie Ann M.D.   On: 06/11/2021 08:26   ? ?Anti-infectives: ?Anti-infectives (From admission, onward)  ? ? None  ? ?  ? ? ?Assessment/Plan: ?Recurrent SBO ?Hx Crohn's s/p robotic assisted subtotal colectomy with ileo-sigmoid anastomosis 06/27/19 by Dr. Marcello Moores.  On maintenance Lialda ?- resolved today ?-fulls ?-Crohns therapy per GI ?-does not appear to need surgery ?  ?FEN - fulls, prealbumin 19 ?VTE - SCDs, heparin (refusing) ?ID - None currently ?  ?Breast CA on antiher2 therapy (not chemotherapy) - followed by Dr. Lindi Adie. Hx bilateral nipple sparing mastectomy 11/09/20 by [name]  ?Hx iron deficiency anemia  ?  ?I reviewed the last 24 hours of vital signs, I/O, pain scores, MAR, hospitalist and nursing notes. ?  ?This care required straightforward level of medical decision making.  ? ? ?[name] ?06/12/2021 ? ?

## 2021-06-12 NOTE — Progress Notes (Signed)
PROGRESS NOTE    [name]  CHY:[id number] DOB: [birth year] DOA: 06/07/2021 PCP: Pcp, No   Brief Narrative: [name] is a 46 y.o. female with a history of Crohn disease s/p colectomy with ileosigmoid anastomosis, breast cancer s/p bilateral mastectomies currently on chemotherapy. Patient presented secondary to abdominal pain, nausea and vomiting. CT abdomen/pelvis significant for evidence of a high grade small bowel obstruction at the site of prior ileosigmoid anastomosis. General surgery consulted for management. NG tube placed and patient is NPO. IV steroids initiated for possible Crohn's flare. Bowel function is improved and NG tube removed. Diet is advancing.   Assessment and Plan: * SBO (small bowel obstruction) (HCC) Recurrent issue. Complicated by history of Crohn disease and prior colectomy with ileosigmoid anastomosis. CT scan on admission significant for high-grade SBO with transition point at site of prior ileosigmoid anastomosis. General surgery consulted on admission. NG tube placed and patient made NPO. Patient has started having bowel function with bowel movements. NG tube removed on 3/4 and diet advanced to full liquids -General surgery recommendations: diet advanced to full liquid -Discontinue IV fluids  Leucocytosis Possibly related to Crohn disease. Afebrile. Leukocytosis is resolved.  Crohn's disease of small and large intestines with complication ([place]) GI consulted for concern that Crohn disease is contributing to recurrent SBO. -GI recommendations: IV solumedrol  Hypokalemia Potassium of 3.2 > 3.4 > 3.4 on BMP. Patient initially declining IV potassium supplementation. Currently NPO with an NG tube. Patient now okay with receiving potassium via IV. Normal magnesium -Kdur 40 meq x2  Malignant neoplasm of upper-outer quadrant of right breast in female, estrogen receptor positive ([place]) Patient follows with Dr. Lindi Norris. Currently receiving chemotherapy as  an outpatient. She is s/p bilateral mastectomies.    DVT prophylaxis: SCDs (patient has declined chemical VTE prophylaxis) Code Status:   Code Status: Full Code Family Communication: Daughter at bedside Disposition Plan: Discharge home pending general surgery/GI recommendations/management. Possibly in 1-3 days   Consultants:  General surgery [facility]  Procedures:  NG tube placement  Antimicrobials: None    Subjective: Passing gas and having bowel movements. Abdominal cramping is improved. No nausea or vomiting. NG tube removed earlier today.  Objective: BP (!) 135/91 (BP Location: Left Arm)    Pulse (!) 105    Temp 98.4 F (36.9 C) (Oral)    Resp 16    Ht 5' 4"  (1.626 m)    Wt 63 kg    SpO2 99%    BMI 23.84 kg/m   Examination:  General exam: Appears calm and comfortable Respiratory system: Clear to auscultation. Respiratory effort normal. Cardiovascular system: S1 & S2 heard, RRR. No murmurs, rubs, gallops or clicks. Gastrointestinal system: Abdomen is nondistended, soft and nontender. No organomegaly or masses felt. Normal bowel sounds heard. Central nervous system: Alert and oriented. No focal neurological deficits. Musculoskeletal: No edema. No calf tenderness Skin: No cyanosis. No rashes Psychiatry: Judgement and insight appear normal. Mood & affect appropriate.    Data Reviewed: I have personally reviewed following labs and imaging studies  CBC Lab Results  Component Value Date   WBC 8.0 06/11/2021   RBC 3.82 (L) 06/11/2021   HGB 10.3 (L) 06/11/2021   HCT 31.7 (L) 06/11/2021   MCV 83.0 06/11/2021   MCH 27.0 06/11/2021   PLT 241 06/11/2021   MCHC 32.5 06/11/2021   RDW 13.8 06/11/2021   LYMPHSABS 1.1 06/07/2021   MONOABS 0.5 06/07/2021   EOSABS 0.0 06/07/2021   BASOSABS 0.1 06/07/2021  Last metabolic panel Lab Results  Component Value Date   NA 133 (L) 06/12/2021   K 3.4 (L) 06/12/2021   CL 94 (L) 06/12/2021   CO2 27 06/12/2021    BUN 19 06/12/2021   CREATININE 0.66 06/12/2021   GLUCOSE 69 (L) 06/12/2021   GFRNONAA >60 06/12/2021   GFRAA 108 01/21/2020   CALCIUM 8.6 (L) 06/12/2021   PHOS 4.1 05/28/2021   PROT 7.5 06/08/2021   ALBUMIN 3.9 06/08/2021   BILITOT 0.9 06/08/2021   ALKPHOS 103 06/08/2021   AST 25 06/08/2021   ALT 20 06/08/2021   ANIONGAP 12 06/12/2021    GFR: Estimated Creatinine Clearance: 78.3 mL/min (by C-G formula based on SCr of 0.66 mg/dL).  Recent Results (from the past 240 hour(s))  Resp Panel by RT-PCR (Flu A&B, Covid) Nasopharyngeal Swab     Status: None   Collection Time: 06/07/21  7:36 PM   Specimen: Nasopharyngeal Swab; Nasopharyngeal(NP) swabs in vial transport medium  Result Value Ref Range Status   SARS Coronavirus 2 by RT PCR NEGATIVE NEGATIVE Final    Comment: (NOTE) SARS-CoV-2 target nucleic acids are NOT DETECTED.  The SARS-CoV-2 RNA is generally detectable in upper respiratory specimens during the acute phase of infection. The lowest concentration of SARS-CoV-2 viral copies this assay can detect is 138 copies/mL. A negative result does not preclude SARS-Cov-2 infection and should not be used as the sole basis for treatment or other patient management decisions. A negative result may occur with  improper specimen collection/handling, submission of specimen other than nasopharyngeal swab, presence of viral mutation(s) within the areas targeted by this assay, and inadequate number of viral copies(<138 copies/mL). A negative result must be combined with clinical observations, patient history, and epidemiological information. The expected result is Negative.  Fact Sheet for Patients:  EntrepreneurPulse.com.au  Fact Sheet for Healthcare Providers:  IncredibleEmployment.be  This test is no t yet approved or cleared by the [place] FDA and  has been authorized for detection and/or diagnosis of SARS-CoV-2 by FDA under an Emergency  Use Authorization (EUA). This EUA will remain  in effect (meaning this test can be used) for the duration of the COVID-19 declaration under Section 564(b)(1) of the Act, 21 U.S.C.section 360bbb-3(b)(1), unless the authorization is terminated  or revoked sooner.       Influenza A by PCR NEGATIVE NEGATIVE Final   Influenza B by PCR NEGATIVE NEGATIVE Final    Comment: (NOTE) The Xpert Xpress SARS-CoV-2/FLU/RSV plus assay is intended as an aid in the diagnosis of influenza from Nasopharyngeal swab specimens and should not be used as a sole basis for treatment. Nasal washings and aspirates are unacceptable for Xpert Xpress SARS-CoV-2/FLU/RSV testing.  Fact Sheet for Patients: EntrepreneurPulse.com.au  Fact Sheet for Healthcare Providers: IncredibleEmployment.be  This test is not yet approved or cleared by the [place] FDA and has been authorized for detection and/or diagnosis of SARS-CoV-2 by FDA under an Emergency Use Authorization (EUA). This EUA will remain in effect (meaning this test can be used) for the duration of the COVID-19 declaration under Section 564(b)(1) of the Act, 21 U.S.C. section 360bbb-3(b)(1), unless the authorization is terminated or revoked.  Performed at [facility], [place] [address], [place], [place] [address]       Radiology Studies: DG Abd Portable 1V  Result Date: 06/12/2021 CLINICAL DATA:  Evaluate small bowel obstruction. EXAM: PORTABLE ABDOMEN - 1 VIEW COMPARISON:  06/11/2021 FINDINGS: The enteric tube is again noted with tip and side port  well below the GE junction. The tip projects over the body of the stomach. Persistent but improved gaseous distension of small bowel loops. Small bowel loops measure up to 4.2 cm versus 5.2 cm previously. IMPRESSION: Persistent but improved gaseous distension of small bowel loops. Electronically Signed   By: Kerby Moors M.D.   On: 06/12/2021 08:23   DG  Abd Portable 1V  Result Date: 06/11/2021 CLINICAL DATA:  45 year old female small-bowel obstruction. EXAM: PORTABLE ABDOMEN - 1 VIEW COMPARISON:  CT Abdomen and Pelvis 06/07/2021. KUB 06/08/2021. FINDINGS: Portable AP supine view at 0608 hours. Enteric tube remains looped at the level of the stomach. Improved but not normalized bowel gas pattern from 3 days ago with continued gas distended small bowel loops now about 5 cm diameter. Prior subtotal colectomy. Stable abdominal visceral contours. No acute osseous abnormality identified. IMPRESSION: 1. Improved but not normalized bowel gas pattern since 06/08/2021. Ongoing at least partial SBO suspected. 2. Enteric tube remains within the stomach. Electronically Signed   By: Genevie Ann M.D.   On: 06/11/2021 08:26      LOS: 5 days    Cordelia Poche, MD [facility] 06/12/2021, 11:41 AM   If 7PM-7AM, please contact night-coverage [email or web address]

## 2021-06-13 ENCOUNTER — Telehealth: Payer: Self-pay

## 2021-06-13 ENCOUNTER — Encounter: Payer: Self-pay | Admitting: Hematology and Oncology

## 2021-06-13 LAB — CBC
HCT: 34.4 % — ABNORMAL LOW (ref 36.0–46.0)
Hemoglobin: 11.2 g/dL — ABNORMAL LOW (ref 12.0–15.0)
MCH: 26.9 pg (ref 26.0–34.0)
MCHC: 32.6 g/dL (ref 30.0–36.0)
MCV: 82.7 fL (ref 80.0–100.0)
Platelets: 280 10*3/uL (ref 150–400)
RBC: 4.16 MIL/uL (ref 3.87–5.11)
RDW: 13.4 % (ref 11.5–15.5)
WBC: 10.7 10*3/uL — ABNORMAL HIGH (ref 4.0–10.5)
nRBC: 0 % (ref 0.0–0.2)

## 2021-06-13 LAB — BASIC METABOLIC PANEL
Anion gap: 8 (ref 5–15)
BUN: 14 mg/dL (ref 6–20)
CO2: 27 mmol/L (ref 22–32)
Calcium: 8.6 mg/dL — ABNORMAL LOW (ref 8.9–10.3)
Chloride: 99 mmol/L (ref 98–111)
Creatinine, Ser: 0.72 mg/dL (ref 0.44–1.00)
GFR, Estimated: >60 mL/min (ref >60)
Glucose, Bld: 85 mg/dL (ref 70–99)
Potassium: 4.2 mmol/L (ref 3.5–5.1)
Sodium: 134 mmol/L — ABNORMAL LOW (ref 135–145)

## 2021-06-13 LAB — GLUCOSE, CAPILLARY
Glucose-Capillary: 87 mg/dL (ref 70–99)
Glucose-Capillary: 87 mg/dL (ref 70–99)

## 2021-06-13 MED ORDER — PREDNISONE 20 MG PO TABS
40.0000 mg | ORAL_TABLET | Freq: Every day | ORAL | Status: DC
  Administered 2021-06-13: 40 mg via ORAL
  Filled 2021-06-13: qty 2

## 2021-06-13 MED ORDER — PREDNISONE 5 MG PO TABS: 10.0000 mg | ORAL_TABLET | Freq: Every day | ORAL | Status: DC

## 2021-06-13 MED ORDER — PREDNISONE 10 MG PO TABS: ORAL_TABLET | ORAL | 0 refills | Status: DC

## 2021-06-13 MED ORDER — ALTEPLASE 2 MG IJ SOLR
2.0000 mg | Freq: Once | INTRAMUSCULAR | Status: DC
  Filled 2021-06-13: qty 2

## 2021-06-13 MED ORDER — HEPARIN SOD (PORK) LOCK FLUSH 100 UNIT/ML IV SOLN
500.0000 [IU] | INTRAVENOUS | Status: AC | PRN
  Administered 2021-06-13: 500 [IU]
  Filled 2021-06-13: qty 5

## 2021-06-13 NOTE — Discharge Summary (Signed)
Physician Discharge Summary   Patient: [name] MRN: [id number] DOB: 1975-06-25  Admit date:     06/07/2021  Discharge date: 06/13/21  Discharge Physician: Jacquelin Hawking, MD   PCP: Pcp, No   Recommendations at discharge:   Prednisone taper GI follow-up  Discharge Diagnoses: Active Problems:   Malignant neoplasm of upper-outer quadrant of right breast in female, estrogen receptor positive (HCC)   Crohn's disease of small and large intestines with complication (HCC)   Leucocytosis  Principal Problem (Resolved):   SBO (small bowel obstruction) (HCC) Resolved Problems:   Hypokalemia  Hospital Course: JURNEI BALYEAT is a 46 y.o. female with a history of Crohn disease s/p colectomy with ileosigmoid anastomosis, breast cancer s/p bilateral mastectomies currently on chemotherapy. Patient presented secondary to abdominal pain, nausea and vomiting. CT abdomen/pelvis significant for evidence of a high grade small bowel obstruction at the site of prior ileosigmoid anastomosis. General surgery consulted for management. NG tube placed and patient is NPO. IV steroids initiated for possible Crohn's flare. Bowel function is improved and NG tube removed. Diet advanced. Patient transitioned to prednisone with taper.  Assessment and Plan: * SBO (small bowel obstruction) (HCC)-resolved as of 06/13/2021 Recurrent issue. Complicated by history of Crohn disease and prior colectomy with ileosigmoid anastomosis. CT scan on admission significant for high-grade SBO with transition point at site of prior ileosigmoid anastomosis. General surgery consulted on admission. NG tube placed and patient made NPO. Patient has started having bowel function with bowel movements. NG tube removed on 3/4 and diet advanced to soft diet. Resolved.  Leucocytosis Possibly related to Crohn disease. Afebrile. Leukocytosis resolved but has worsened slightly after initiation of steroids.  Crohn's disease of small and large  intestines with complication (HCC) GI consulted for concern that Crohn disease is contributing to recurrent SBO. Initiation of Solu-medrol led to improvement of obstructive symptoms. GI recommendation for prednisone on discharge with slow taper and outpatient follow-up in 4 weeks.  Malignant neoplasm of upper-outer quadrant of right breast in female, estrogen receptor positive (HCC) Patient follows with Dr. Pamelia Hoit. Currently receiving chemotherapy as an outpatient. She is s/p bilateral mastectomies.  Hypokalemia-resolved as of 06/13/2021 Potassium supplementation given. Now resolved.        Consultants: General surgery, Gastroenterology Procedures performed: None  Disposition: Home Diet recommendation:  Soft diet/low fiber  DISCHARGE MEDICATION: Allergies as of 06/13/2021       Reactions   Nsaids Other (See Comments)   Crohns disease/ IBD   Other    BLOOD PRODUCT REFUSAL        Medication List     TAKE these medications    acetaminophen 500 MG tablet Commonly known as: TYLENOL Take 500-1,000 mg by mouth every 6 (six) hours as needed for mild pain.   lidocaine-prilocaine cream Commonly known as: EMLA Apply 1 application topically as needed (Prior to port access).   mesalamine 1.2 g EC tablet Commonly known as: Lialda Take 2 tablets (2.4 g total) by mouth 2 (two) times daily with a meal.   multivitamin with minerals tablet Take 1 tablet by mouth daily.   polyethylene glycol 17 g packet Commonly known as: MiraLax Take 17 g by mouth daily as needed. What changed: reasons to take this   predniSONE 10 MG tablet Commonly known as: DELTASONE Take 4 tablets (40 mg total) by mouth daily with breakfast for 6 days, THEN 3.5 tablets (35 mg total) daily with breakfast for 7 days, THEN 3 tablets (30 mg total) daily with breakfast  for 7 days, THEN 2.5 tablets (25 mg total) daily with breakfast for 7 days, THEN 2 tablets (20 mg total) daily with breakfast. Start taking on:  June 14, 2021        Follow-up Information     Iva Boop, MD. Schedule an appointment as soon as possible for a visit in 4 week(s).   Specialty: Gastroenterology Why: For hospital follow-up Contact information: [address] [place] [address] [phone]                Discharge Exam: Filed Weights   06/07/21 1814  Weight: 63 kg   General exam: Appears calm and comfortable Respiratory system: Clear to auscultation. Respiratory effort normal. Cardiovascular system: S1 & S2 heard, RRR. No murmurs, rubs, gallops or clicks. Gastrointestinal system: Abdomen is slightly distended, soft and nontender. No organomegaly or masses felt. Normal bowel sounds heard. Central nervous system: Alert and oriented. No focal neurological deficits. Musculoskeletal: No edema. No calf tenderness Skin: No cyanosis. No rashes Psychiatry: Judgement and insight appear normal. Mood & affect appropriate.   Condition at discharge: stable  The results of significant diagnostics from this hospitalization (including imaging, microbiology, ancillary and laboratory) are listed below for reference.   Imaging Studies: DG Abd 1 View  Result Date: 05/28/2021 CLINICAL DATA:  NG tube placement. EXAM: ABDOMEN - 1 VIEW COMPARISON:  05/27/2021. FINDINGS: Multiple loops of dilated small bowel with air-fluid levels are noted in the abdomen measuring up to 4.7 cm in diameter. An enteric tube side port is seen at the gastroesophageal junction and should be advanced approximately 5 cm. No radio-opaque calculi. Left chest port is stable. Surgical clips are present in the right axilla. IMPRESSION: 1. Multiple distended loops of small bowel in the upper abdomen with air-fluid levels, compatible with small bowel obstruction. 2. The side port of the enteric tube is at the gastroesophageal junction and should be advanced approximately 5 cm. Electronically Signed   By: Thornell Sartorius M.D.   On: 05/28/2021 00:39    CT ABDOMEN PELVIS W CONTRAST  Result Date: 06/07/2021 CLINICAL DATA:  Abdominal pain, nausea and vomiting, hiccups EXAM: CT ABDOMEN AND PELVIS WITH CONTRAST TECHNIQUE: Multidetector CT imaging of the abdomen and pelvis was performed using the standard protocol following bolus administration of intravenous contrast. RADIATION DOSE REDUCTION: This exam was performed according to the departmental dose-optimization program which includes automated exposure control, adjustment of the mA and/or kV according to patient size and/or use of iterative reconstruction technique. CONTRAST:  OMNIPAQUE IOHEXOL 300 MG/ML  SOLN COMPARISON:  05/27/2021 FINDINGS: Lower chest: No acute pleural or parenchymal lung disease. Hepatobiliary: No focal liver abnormality is seen. No gallstones, gallbladder wall thickening, or biliary dilatation. Pancreas: Unremarkable. No pancreatic ductal dilatation or surrounding inflammatory changes. Spleen: Normal in size without focal abnormality. Adrenals/Urinary Tract: Adrenal glands are unremarkable. Kidneys are normal, without renal calculi, focal lesion, or hydronephrosis. Bladder is decompressed, limiting its evaluation. Stomach/Bowel: Postsurgical changes from subtotal colectomy. There is progressive small-bowel obstruction with abrupt transition at the ileo sigmoid anastomosis within the central upper pelvis. Increasing caliber of the dilated small bowel measuring up to 5 cm, with diffuse gas fluid levels. Vascular/Lymphatic: No significant vascular findings are present. No enlarged abdominal or pelvic lymph nodes. Reproductive: A large heterogeneous uterus consistent with fibroids unchanged. No adnexal masses. Other: No free fluid or free intraperitoneal gas. No abdominal wall hernia. Musculoskeletal: Bilateral breast prostheses. No acute or destructive bony lesions. Reconstructed images demonstrate no additional findings.  IMPRESSION: 1. Progressive high-grade small bowel obstruction,  with abrupt transition point at the site of prior ileo sigmoid anastomosis within the central upper pelvis. Increasing caliber of the small bowel with diffuse gas fluid levels again noted. 2. Fibroid uterus. Electronically Signed   By: Sharlet Salina M.D.   On: 06/07/2021 19:32   CT ABDOMEN PELVIS W CONTRAST  Result Date: 05/27/2021 CLINICAL DATA:  Abdominal pain, acute, nonlocalized EXAM: CT ABDOMEN AND PELVIS WITH CONTRAST TECHNIQUE: Multidetector CT imaging of the abdomen and pelvis was performed using the standard protocol following bolus administration of intravenous contrast. RADIATION DOSE REDUCTION: This exam was performed according to the departmental dose-optimization program which includes automated exposure control, adjustment of the mA and/or kV according to patient size and/or use of iterative reconstruction technique. CONTRAST:  OMNIPAQUE IOHEXOL 300 MG/ML  SOLN COMPARISON:  Abdominal radiograph 01/26/2021, CT 01/26/2021 FINDINGS: Lower chest: Small peripheral opacity in the right lung base, appears to be a subsolid nodule with solid component measuring 3 mm (series 6, image 22). Per Fleischner guidelines, no routine follow-up imaging is recommended. There are bilateral saline breast implants with radial folds. Hepatobiliary: No focal liver abnormality is seen. The gallbladder is unremarkable. Pancreas: Unremarkable. No pancreatic ductal dilatation or surrounding inflammatory changes. Spleen: Normal in size without focal abnormality. Adrenals/Urinary Tract: Adrenal glands are unremarkable. Unchanged 7 mm left adrenal nodule, statistically likely to be an adenoma. Normal right adrenal gland. No hydronephrosis or nephrolithiasis. Stomach/Bowel: Prior subtotal colectomy with ileosigmoid anastomosis. Diffusely dilated small bowel with air-fluid levels, and transition point in the central pelvis (coronal images 44-60), at the ileosigmoid anastomosis. There is twisting and kinking of the  anastomosis. Vascular/Lymphatic: There are few prominent mesenteric lymph nodes in the mid abdomen. No significant vascular findings are present. No AAA. Reproductive: Unchanged multiple uterine fibroids, a few of which are calcified. Other: Small volume abdominopelvic ascites.  No free air. Musculoskeletal: No acute or significant osseous findings. IMPRESSION: Small-bowel obstruction with transition point at the ileosigmoid anastomosis. Twisting and kinking at the transition point, likely due to adhesions. Small volume abdominopelvic free fluid. No free air. Electronically Signed   By: Caprice Renshaw M.D.   On: 05/27/2021 16:16   DG Abd Portable 1V  Result Date: 06/12/2021 CLINICAL DATA:  Evaluate small bowel obstruction. EXAM: PORTABLE ABDOMEN - 1 VIEW COMPARISON:  06/11/2021 FINDINGS: The enteric tube is again noted with tip and side port well below the GE junction. The tip projects over the body of the stomach. Persistent but improved gaseous distension of small bowel loops. Small bowel loops measure up to 4.2 cm versus 5.2 cm previously. IMPRESSION: Persistent but improved gaseous distension of small bowel loops. Electronically Signed   By: Signa Kell M.D.   On: 06/12/2021 08:23   DG Abd Portable 1V  Result Date: 06/11/2021 CLINICAL DATA:  46 year old female small-bowel obstruction. EXAM: PORTABLE ABDOMEN - 1 VIEW COMPARISON:  CT Abdomen and Pelvis 06/07/2021. KUB 06/08/2021. FINDINGS: Portable AP supine view at 0608 hours. Enteric tube remains looped at the level of the stomach. Improved but not normalized bowel gas pattern from 3 days ago with continued gas distended small bowel loops now about 5 cm diameter. Prior subtotal colectomy. Stable abdominal visceral contours. No acute osseous abnormality identified. IMPRESSION: 1. Improved but not normalized bowel gas pattern since 06/08/2021. Ongoing at least partial SBO suspected. 2. Enteric tube remains within the stomach. Electronically Signed   By: Odessa Fleming M.D.   On: 06/11/2021 08:26  DG Abd Portable 1V-Small Bowel Obstruction Protocol-initial, 8 hr delay  Result Date: 06/08/2021 CLINICAL DATA:  Small-bowel obstruction, oral contrast administration EXAM: PORTABLE ABDOMEN - 1 VIEW COMPARISON:  06/07/2021 FINDINGS: Supine frontal view of the abdomen and pelvis demonstrates persistent gaseous distension of the small bowel measuring up to 6.2 cm in diameter. No oral contrast is identified on this exam. Minimal residual contrast within the bladder from previous CT exam. Calcified uterine fibroid. Enteric catheter coiled over the gastric fundus. IMPRESSION: 1. Oral contrast is not definitively identified on this study. 2. Progressive gaseous distention of the small bowel consistent with obstruction. 3. Enteric catheter overlying the gastric fundus. Electronically Signed   By: Sharlet Salina M.D.   On: 06/08/2021 21:45   DG Abd Portable 1V  Result Date: 05/28/2021 CLINICAL DATA:  Small-bowel obstruction EXAM: PORTABLE ABDOMEN - 1 VIEW COMPARISON:  Previous studies including the examination done earlier today FINDINGS: Tip of enteric tube is seen in the stomach. There is dilation of small-bowel loops measuring up to 4.7 cm. There is possible slight decrease in degree of small-bowel dilation. Stomach is not distended. There is no demonstrable gas in colon, possibly due to previous colectomy. There is coarse calcification in the left side of pelvis, possibly calcified uterine fibroid. IMPRESSION: There is abnormal dilation of small-bowel loops suggesting distal small bowel obstruction. There is possible slight decrease in degree of small-bowel dilation. Electronically Signed   By: Ernie Avena M.D.   On: 05/28/2021 13:09   DG Abd Portable 1V-Small Bowel Obstruction Protocol-initial, 8 hr delay  Result Date: 05/28/2021 CLINICAL DATA:  Small-bowel obstruction, 8 hour delay. EXAM: PORTABLE ABDOMEN - 1 VIEW COMPARISON:  05/28/2021. FINDINGS: There is  redemonstration of multiple dilated loops of small bowel in the abdomen measuring up to 4.7 cm. Small amount of residual contrast is present in the left upper quadrant. An enteric tube is noted in the stomach. A calcified lesion is noted in the pelvis on the left, compatible with known fibroid. Excreted contrast is present in the urinary bladder. IMPRESSION: 1. Multiple distended loops of small bowel in the abdomen, compatible with known small bowel obstruction and not significantly changed from the prior exam. 2. Enteric tube terminates in the stomach. Electronically Signed   By: Thornell Sartorius M.D.   On: 05/28/2021 03:09   DG Abd Portable 1V-Small Bowel Protocol-Position Verification  Result Date: 05/27/2021 CLINICAL DATA:  Enteric catheter placement, breast cancer EXAM: PORTABLE ABDOMEN - 1 VIEW COMPARISON:  01/26/2021 FINDINGS: Upright frontal view of the lower chest and upper abdomen demonstrates enteric catheter passing below diaphragm coiled over the gastric fundus. Gaseous distention of the small bowel is seen, compatible with obstruction seen on prior CT. Lung bases are clear. IMPRESSION: 1. Enteric catheter coiled over the gastric fundus. 2. Small bowel obstruction. Electronically Signed   By: Sharlet Salina M.D.   On: 05/27/2021 17:50   US THYROID  Result Date: 05/25/2021 CLINICAL DATA:  Palpable abnormality. Thyromegaly on physical examination. EXAM: THYROID ULTRASOUND TECHNIQUE: Ultrasound examination of the thyroid gland and adjacent soft tissues was performed. COMPARISON:  None. FINDINGS: Parenchymal Echotexture: Moderately heterogenous Isthmus: Normal in size measures 0.4 cm in diameter Right lobe: Borderline enlarged measuring 4.5 x 2.1 x 2.1 cm Left lobe: Borderline enlarged measuring 4.7 x 2.0 x 2.0 cm _________________________________________________________ Estimated total number of nodules >/= 1 cm: 0 Number of spongiform nodules >/=  2 cm not described below (TR1): 0 Number of mixed  cystic and solid nodules >/= 1.5  cm not described below (TR2): 0 _________________________________________________________ There is an approximately 0.9 x 0.9 x 0.6 cm anechoic cyst within the right-sided the thyroid isthmus (labeled 1), which does not meet criteria to recommend percutaneous sampling or continued dedicated follow-up. There is an approximately 0.7 x 0.6 x 0.3 cm spongiform/benign-appearing nodule within mid aspect the right lobe of the thyroid (labeled 2), which does not meet criteria to recommend percutaneous sampling or continued dedicated follow-up There is an approximately 0.7 x 0.6 x 0.5 cm mixed partially cystic, partially solid nodule within the mid, posterior aspect the right lobe of the thyroid (labeled 3), which does not meet criteria to recommend percutaneous sampling or continued dedicated follow-up There is an approximately 0.7 x 0.5 x 0.3 cm spongiform/benign-appearing nodule within the inferior pole of the right lobe of the thyroid (labeled 4), which does not meet criteria to recommend percutaneous sampling or continued dedicated follow-up _________________________________________________________ There is an approximately 0.8 x 0.6 x 0.5 cm spongiform/benign-appearing nodule within mid aspect the left lobe of the thyroid (labeled 5), which does not meet imaging criteria to recommend percutaneous sampling or continued dedicated follow-up. IMPRESSION: 1. Borderline enlarged and moderately heterogeneous appearing thyroid without worrisome nodule or mass. 2. None of the discretely measured punctate (sub 0.9 cm) spongiform/benign-appearing nodules or cysts meet imaging criteria to recommend percutaneous sampling or continued dedicated follow-up. The above is in keeping with the ACR TI-RADS recommendations - J Am Coll Radiol 2017;14:587-595. Electronically Signed   By: Simonne Come M.D.   On: 05/25/2021 15:11    Microbiology: Results for orders placed or performed during the hospital  encounter of 06/07/21  Resp Panel by RT-PCR (Flu A&B, Covid) Nasopharyngeal Swab     Status: None   Collection Time: 06/07/21  7:36 PM   Specimen: Nasopharyngeal Swab; Nasopharyngeal(NP) swabs in vial transport medium  Result Value Ref Range Status   SARS Coronavirus 2 by RT PCR NEGATIVE NEGATIVE Final    Comment: (NOTE) SARS-CoV-2 target nucleic acids are NOT DETECTED.  The SARS-CoV-2 RNA is generally detectable in upper respiratory specimens during the acute phase of infection. The lowest concentration of SARS-CoV-2 viral copies this assay can detect is 138 copies/mL. A negative result does not preclude SARS-Cov-2 infection and should not be used as the sole basis for treatment or other patient management decisions. A negative result may occur with  improper specimen collection/handling, submission of specimen other than nasopharyngeal swab, presence of viral mutation(s) within the areas targeted by this assay, and inadequate number of viral copies(<138 copies/mL). A negative result must be combined with clinical observations, patient history, and epidemiological information. The expected result is Negative.  Fact Sheet for Patients:  BloggerCourse.com  Fact Sheet for Healthcare Providers:  SeriousBroker.it  This test is no t yet approved or cleared by the [place] FDA and  has been authorized for detection and/or diagnosis of SARS-CoV-2 by FDA under an Emergency Use Authorization (EUA). This EUA will remain  in effect (meaning this test can be used) for the duration of the COVID-19 declaration under Section 564(b)(1) of the Act, 21 U.S.C.section 360bbb-3(b)(1), unless the authorization is terminated  or revoked sooner.       Influenza A by PCR NEGATIVE NEGATIVE Final   Influenza B by PCR NEGATIVE NEGATIVE Final    Comment: (NOTE) The Xpert Xpress SARS-CoV-2/FLU/RSV plus assay is intended as an aid in the diagnosis of  influenza from Nasopharyngeal swab specimens and should not be used as a sole basis for treatment. Nasal  washings and aspirates are unacceptable for Xpert Xpress SARS-CoV-2/FLU/RSV testing.  Fact Sheet for Patients: BloggerCourse.com  Fact Sheet for Healthcare Providers: SeriousBroker.it  This test is not yet approved or cleared by the [place] FDA and has been authorized for detection and/or diagnosis of SARS-CoV-2 by FDA under an Emergency Use Authorization (EUA). This EUA will remain in effect (meaning this test can be used) for the duration of the COVID-19 declaration under Section 564(b)(1) of the Act, 21 U.S.C. section 360bbb-3(b)(1), unless the authorization is terminated or revoked.  Performed at [facility], [address], [place], [place] [address]     Labs: CBC: Recent Labs  Lab 06/07/21 1818 06/07/21 1835 06/08/21 0411 06/09/21 0430 06/11/21 0343 06/13/21 0316  WBC 21.9*  --  17.1* 10.0 8.0 10.7*  NEUTROABS 20.2*  --   --   --   --   --   HGB 14.0 15.6* 13.5 11.3* 10.3* 11.2*  HCT 42.8 46.0 40.5 35.2* 31.7* 34.4*  MCV 81.5  --  80.5 82.8 83.0 82.7  PLT 343  --  272 271 241 280   Basic Metabolic Panel: Recent Labs  Lab 06/09/21 0430 06/10/21 0414 06/11/21 0343 06/12/21 0319 06/13/21 0316  NA 139 138 134* 133* 134*  K 3.2* 3.4* 3.4* 3.4* 4.2  CL 103 100 97* 94* 99  CO2 28 29 29 27 27   GLUCOSE 119* 100* 80 69* 85  BUN 9 13 16 19 14   CREATININE 0.62 0.70 0.65 0.66 0.72  CALCIUM 8.9 8.6* 8.4* 8.6* 8.6*  MG  --  2.0  --  2.0  --    Liver Function Tests: Recent Labs  Lab 06/07/21 1818 06/08/21 0411  AST 24 25  ALT 22 20  ALKPHOS 129* 103  BILITOT 0.5 0.9  PROT 8.8* 7.5  ALBUMIN 4.5 3.9   CBG: Recent Labs  Lab 06/12/21 0643 06/12/21 1317 06/12/21 2102 06/13/21 0022 06/13/21 0728  GLUCAP 74 103* 132* 87 87    Discharge time spent:  35  minutes.  Signed: Jacquelin Hawking, MD [facility] 06/13/2021

## 2021-06-13 NOTE — Progress Notes (Signed)
Discharge instructions given to patient and all questions were answered.  

## 2021-06-13 NOTE — Telephone Encounter (Signed)
Patient has been scheduled for a 4-wk hospital f/u with Dr. Carlean Purl on Wednesday, 07/13/21 at 11:10 am. Appt will be on hospital discharge summary. Letter mailed to patient with appt information. ?

## 2021-06-13 NOTE — Progress Notes (Addendum)
? ? Progress Note ? ? Subjective  ?Chief Complaint: High-grade distal SBO with a transition point at the ileocolonic anastomosis, Crohn's colitis status post subtotal colectomy ? ?Today, patient tells me she is much better than when she arrived at the hospital.  She tolerated a full liquid diet yesterday and is ordered a soft diet for today.  She is having bowel movements passing gas with no further nausea or vomiting.  She asked me about going home today.  Also has other questions in regards to steroids and upcoming surgeries. ? ? Objective  ? ?Vital signs in last 24 hours: ?Temp:  [98.1 ?F (36.7 ?C)-98.6 ?F (37 ?C)] 98.1 ?F (36.7 ?C) (03/06 1443) ?Pulse Rate:  [90-100] 93 (03/06 0513) ?Resp:  [16-18] 18 (03/06 0513) ?BP: (128-130)/(92-97) 128/92 (03/06 1540) ?SpO2:  [99 %-100 %] 100 % (03/06 0513) ?Last BM Date : 06/12/21 ?General:  AA female in NAD ?Heart:  Regular rate and rhythm; no murmurs ?Lungs: Respirations even and unlabored, lungs CTA bilaterally ?Abdomen:  Soft, nontender and nondistended. Normal bowel sounds. ?Psych:  Cooperative. Normal mood and affect. ? ?Intake/Output from previous day: ?03/05 0701 - 03/06 0700 ?In: 360 [P.O.:360] ?Out: 700 [Urine:700] ? ? ?Lab Results: ?Recent Labs  ?  06/11/21 ?0867 06/13/21 ?0316  ?WBC 8.0 10.7*  ?HGB 10.3* 11.2*  ?HCT 31.7* 34.4*  ?PLT 241 280  ? ?BMET ?Recent Labs  ?  06/11/21 ?6195 06/12/21 ?0319 06/13/21 ?0316  ?NA 134* 133* 134*  ?K 3.4* 3.4* 4.2  ?CL 97* 94* 99  ?CO2 29 27 27   ?GLUCOSE 80 69* 85  ?BUN 16 19 14   ?CREATININE 0.65 0.66 0.72  ?CALCIUM 8.4* 8.6* 8.6*  ? ? ?Studies/Results: ?DG Abd Portable 1V ? ?Result Date: 06/12/2021 ?CLINICAL DATA:  Evaluate small bowel obstruction. EXAM: PORTABLE ABDOMEN - 1 VIEW COMPARISON:  06/11/2021 FINDINGS: The enteric tube is again noted with tip and side port well below the GE junction. The tip projects over the body of the stomach. Persistent but improved gaseous distension of small bowel loops. Small bowel loops  measure up to 4.2 cm versus 5.2 cm previously. IMPRESSION: Persistent but improved gaseous distension of small bowel loops. Electronically Signed   By: Kerby Moors M.D.   On: 06/12/2021 08:23   ? ? ? Assessment / Plan:   ?Assessment: ?1.  High-grade distal SBO with a transition point at the ileocolonic anastomosis: NG tube removed yesterday, patient tolerated full liquid diet last night and starting soft today, passing stool, no nausea or vomiting or abdominal pain on IV steroids ?2.  Crohn's colitis status post subtotal colectomy with ileosigmoid anastomosis-06/2019 ?3.  Stage III breast cancer status post bilateral mastectomy 8/22, completing chemotherapy 07/08/2021 with plans for reconstruction in April ? ?Plan: ?1.  Stopped IV steroids.  Started Prednisone 40 mg p.o. today.  This will be tapered down by 5 mg per week until she reaches 20 mg and then stay on this dose until follow up. ?2.  Patient asked if starting Prednisone would delay her reconstruction surgery.  Did discuss that oftentimes when patients are on steroids it inhibits healing and they may want to wait until she is done with this, but it will be their decision ultimately. ?3.  Patient also asked if her chemotherapy would be bad with the Prednisone.  Explained that this puts her immune system at an even lower level and she needs to be very careful as far as leaving the house and wearing a mask etc. ?4.  Patient will need follow-up in our clinic with Dr. Carlean Purl to decide next best steps going forward.  We will go ahead and arrange for this in the next 4 to 6 weeks. ?5.  Okay for soft diet today and advance as tolerated, patient can be discharged tomorrow if doing well on p.o. steroids. ? ?Thank you for your kind consultation. ? ? ? LOS: 6 days  ? ?Lavone Nian [facility]  06/13/2021, 9:36 AM ? ?  ?

## 2021-06-13 NOTE — Telephone Encounter (Signed)
-----   Message from Levin Erp, [place] sent at 06/13/2021  9:44 AM EST ----- ?Regarding: Follow up ?Patient needs follow-up with Dr. Carlean Purl only in the next 4 to 6 weeks.  This is for Crohn's. ? ?Thanks, JL L ? ?

## 2021-06-13 NOTE — Progress Notes (Signed)
? ? ?Assessment & Plan: ?HD#7 - recurrent SBO; Hx Crohn's s/p robotic assisted subtotal colectomy with ileo-sigmoid anastomosis 06/27/19 by Dr. Marcello Moores.  On maintenance Lialda ? Tolerating FLD - will advance to soft diet this AM ? On steroids per GI - follow up with Dr. Carlean Purl ? OK for discharge home from surgical standpoint ?  ?      Courtney Gemma, MD ?      [name] ?      Office: [phone] ? ? ?Chief Complaint: ?Small bowel obstruction ? ?Subjective: ?Patient comfortable, less distended, having BM's, tolerating diet. ? ?Objective: ?Vital signs in last 24 hours: ?Temp:  [98.1 ?F (36.7 ?C)-98.6 ?F (37 ?C)] 98.1 ?F (36.7 ?C) (03/06 7619) ?Pulse Rate:  [90-100] 93 (03/06 0513) ?Resp:  [16-18] 18 (03/06 0513) ?BP: (128-130)/(92-97) 128/92 (03/06 5093) ?SpO2:  [99 %-100 %] 100 % (03/06 0513) ?Last BM Date : 06/12/21 ? ?Intake/Output from previous day: ?03/05 0701 - 03/06 0700 ?In: 360 [P.O.:360] ?Out: 700 [Urine:700] ?Intake/Output this shift: ?No intake/output data recorded. ? ?Physical Exam: ?HEENT - sclerae clear, mucous membranes moist ?Abdomen - mild distension, non-tender ?Ext - no edema, non-tender ?Neuro - alert & oriented, no focal deficits ? ?Lab Results:  ?Recent Labs  ?  06/11/21 ?2671 06/13/21 ?0316  ?WBC 8.0 10.7*  ?HGB 10.3* 11.2*  ?HCT 31.7* 34.4*  ?PLT 241 280  ? ?BMET ?Recent Labs  ?  06/12/21 ?0319 06/13/21 ?2458  ?NA 133* 134*  ?K 3.4* 4.2  ?CL 94* 99  ?CO2 27 27  ?GLUCOSE 69* 85  ?BUN 19 14  ?CREATININE 0.66 0.72  ?CALCIUM 8.6* 8.6*  ? ?PT/INR ?No results for input(s): LABPROT, INR in the last 72 hours. ?Comprehensive Metabolic Panel: ?   ?Component Value Date/Time  ? NA 134 (L) 06/13/2021 0316  ? NA 133 (L) 06/12/2021 0319  ? NA 135 01/21/2020 1056  ? K 4.2 06/13/2021 0316  ? K 3.4 (L) 06/12/2021 0319  ? CL 99 06/13/2021 0316  ? CL 94 (L) 06/12/2021 0319  ? CO2 27 06/13/2021 0316  ? CO2 27 06/12/2021 0319  ? BUN 14 06/13/2021 0316  ? BUN 19 06/12/2021 0319  ? BUN 12  01/21/2020 1056  ? CREATININE 0.72 06/13/2021 0316  ? CREATININE 0.66 06/12/2021 0319  ? CREATININE 0.79 01/21/2021 0817  ? CREATININE 0.94 11/19/2020 0854  ? GLUCOSE 85 06/13/2021 0316  ? GLUCOSE 69 (L) 06/12/2021 0319  ? CALCIUM 8.6 (L) 06/13/2021 0316  ? CALCIUM 8.6 (L) 06/12/2021 0319  ? AST 25 06/08/2021 0411  ? AST 24 06/07/2021 1818  ? AST 17 01/21/2021 0817  ? AST 24 11/19/2020 0854  ? ALT 20 06/08/2021 0411  ? ALT 22 06/07/2021 1818  ? ALT 13 01/21/2021 0817  ? ALT 24 11/19/2020 0854  ? ALKPHOS 103 06/08/2021 0411  ? ALKPHOS 129 (H) 06/07/2021 1818  ? BILITOT 0.9 06/08/2021 0411  ? BILITOT 0.5 06/07/2021 1818  ? BILITOT 0.5 01/21/2021 0817  ? BILITOT <0.2 (L) 11/19/2020 0854  ? PROT 7.5 06/08/2021 0411  ? PROT 8.8 (H) 06/07/2021 1818  ? ALBUMIN 3.9 06/08/2021 0411  ? ALBUMIN 4.5 06/07/2021 1818  ? ? ?Studies/Results: ?DG Abd Portable 1V ? ?Result Date: 06/12/2021 ?CLINICAL DATA:  Evaluate small bowel obstruction. EXAM: PORTABLE ABDOMEN - 1 VIEW COMPARISON:  06/11/2021 FINDINGS: The enteric tube is again noted with tip and side port well below the GE junction. The tip projects over the body of the stomach. Persistent but  improved gaseous distension of small bowel loops. Small bowel loops measure up to 4.2 cm versus 5.2 cm previously. IMPRESSION: Persistent but improved gaseous distension of small bowel loops. Electronically Signed   By: Kerby Moors M.D.   On: 06/12/2021 08:23   ? ? ? ?[name] ?06/13/2021 ? ? Patient ID: [name], female   DOB: 05/30/1975, 46 y.o.   MRN: [id number] ? ?

## 2021-06-17 ENCOUNTER — Inpatient Hospital Stay: Payer: Managed Care, Other (non HMO) | Admitting: Hematology and Oncology

## 2021-06-17 ENCOUNTER — Encounter: Payer: Managed Care, Other (non HMO) | Admitting: Dietician

## 2021-06-17 ENCOUNTER — Inpatient Hospital Stay: Payer: Managed Care, Other (non HMO)

## 2021-06-17 ENCOUNTER — Encounter: Payer: Self-pay | Admitting: Internal Medicine

## 2021-06-22 NOTE — Progress Notes (Signed)
? ?Patient Care Team: ?Pcp, No as PCP - General ?[name], RN as Oncology Nurse Navigator ?[name], RN as Oncology Nurse Navigator ?[name], MD as Consulting Physician (Colon and Rectal Surgery) ?[name], MD as Consulting Physician (Gastroenterology) ?[name], MD as Consulting Physician (General Surgery) ?[name], MD as Consulting Physician (Hematology and Oncology) ? ?DIAGNOSIS:  ?Encounter Diagnosis  ?Name Primary?  ? Malignant neoplasm of upper-outer quadrant of right breast in female, estrogen receptor positive ([place])   ? ? ?SUMMARY OF ONCOLOGIC HISTORY: ?Oncology History  ?Malignant neoplasm of upper-outer quadrant of right breast in female, estrogen receptor positive ([place])  ?06/03/2020 Mammogram  ? 06/03/2020, US guided biopsy of the right breast 9 0 clock mass showed grade III IDC; Prognostics showed ER 40% positive, weak staining, PR positive 0 %, Her 2 3 +, Ki 67 30% ?  ?06/04/2020 Initial Diagnosis  ? 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 palpable right breast mass measuring about 7 cm x 5 and half centimeters, palpable right axillary lymph node with concern for skin invasion  ?  ?06/08/2020 Cancer Staging  ? Staging form: Breast, AJCC 8th Edition ?- Clinical: Stage IIIB (cT4d, cN1, cM0, G3, ER+, PR-, HER2+) - Signed by [name], MD on 07/07/2020 ?Histologic grading system: 3 grade system ? ?  ?06/24/2020 Genetic Testing  ? No pathogenic variants detected in [facility].  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  The report date is June 24, 2020.  ? ?The CustomNext-Cancer+RNAinsight panel offered by [facility] includes sequencing and rearrangement analysis for the following 47 genes:  APC, ATM, AXIN2, BARD1, BMPR1A, BRCA1, BRCA2, BRIP1, CDH1, CDK4, CDKN2A, CHEK2, DICER1, EPCAM, GREM1, HOXB13, MEN1, MLH1, MSH2, MSH3, MSH6, MUTYH, NBN, NF1, NF2, NTHL1, PALB2, PMS2, POLD1, POLE,  PTEN, RAD51C, RAD51D, RECQL, RET, SDHA, SDHAF2, SDHB, SDHC, SDHD, SMAD4, SMARCA4, STK11, TP53, TSC1, TSC2, and VHL.  RNA data is routinely analyzed for use in variant interpretation for all genes. ?  ?06/25/2020 - 10/29/2020 Chemotherapy  ? TCHP x6 cycles ?  ?11/09/2020 Surgery  ? Bilateral mastectomies: Left mastectomy: Benign ?Right mastectomy: Residual microinvasive cancer status post neoadjuvant therapy, DCIS, 0/12 lymph nodes, ER +1%, PR negative, HER2 equivocal 2+ by IHC ?  ?11/19/2020 -  Chemotherapy  ? Patient is on Treatment Plan : BREAST Trastuzumab  + Pertuzumab q21d x 13 cycles  ?   ? ? ?CHIEF COMPLIANT: Follow-up of HER-2 positive right breast cancer ? ?INTERVAL HISTORY: BLIMIE VANESS is a 46 y.o. with above-mentioned history of HER-2 positive right breast cancer currently on Herceptin Perjeta maintenance. She presents to the clinic today for follow-up and toxicity check.  She was recently hospitalized with small bowel obstruction as result of prior colectomy with ileosigmoid anastomosis.  This was treated with n.p.o. IV steroids for possible Crohn's flareup and finally bowel function improved and she was able to be discharged home on a prednisone taper.  She tells [place] that she hates being on prednisone and she cannot wait to be done with that.  She has 2 more infusions of anti-HER2 therapy left. ? ? ?ALLERGIES:  is allergic to nsaids and other. ? ?MEDICATIONS:  ?Current Outpatient Medications  ?Medication Sig Dispense Refill  ? acetaminophen (TYLENOL) 500 MG tablet Take 500-1,000 mg by mouth every 6 (six) hours as needed for mild pain.    ? lidocaine-prilocaine (EMLA) cream Apply 1 application topically as needed (Prior to port access). 30 g 2  ?  mesalamine (LIALDA) 1.2 g EC tablet Take 2 tablets (2.4 g total) by mouth 2 (two) times daily with a meal. 120 tablet 11  ? Multiple Vitamins-Minerals (MULTIVITAMIN WITH MINERALS) tablet Take 1 tablet by mouth daily.    ? polyethylene glycol (MIRALAX) 17 g packet  Take 17 g by mouth daily as needed. (Patient taking differently: Take 17 g by mouth daily as needed for mild constipation.) 14 each 0  ? predniSONE (DELTASONE) 10 MG tablet Take 4 tablets (40 mg total) by mouth daily with breakfast for 6 days, THEN 3.5 tablets (35 mg total) daily with breakfast for 7 days, THEN 3 tablets (30 mg total) daily with breakfast for 7 days, THEN 2.5 tablets (25 mg total) daily with breakfast for 7 days, THEN 2 tablets (20 mg total) daily with breakfast. 147 tablet 0  ? ?No current facility-administered medications for this visit.  ? ?Facility-Administered Medications Ordered in Other Visits  ?Medication Dose Route Frequency Provider Last Rate Last Admin  ? sodium chloride flush (NS) 0.9 % injection 10 mL  10 mL Intracatheter PRN [name], MD   10 mL at 06/24/21 1409  ? ? ?PHYSICAL EXAMINATION: ?ECOG PERFORMANCE STATUS: 1 - Symptomatic but completely ambulatory ? ?Vitals:  ? 06/24/21 1158  ?BP: (!) 154/94  ?Pulse: (!) 114  ?Resp: 18  ?Temp: 97.7 ?F (36.5 ?C)  ?SpO2: 98%  ? ?Filed Weights  ? 06/24/21 1158  ?Weight: 136 lb 4.8 oz (61.8 kg)  ? ?  ? ?LABORATORY DATA:  ?I have reviewed the data as listed ?CMP Latest Ref Rng & Units 06/24/2021 06/13/2021 06/12/2021  ?Glucose 70 - 99 mg/dL 96 85 69(L)  ?BUN 6 - 20 mg/dL 9 14 19   ?Creatinine 0.44 - 1.00 mg/dL 0.86 0.72 0.66  ?Sodium 135 - 145 mmol/L 139 134(L) 133(L)  ?Potassium 3.5 - 5.1 mmol/L 3.8 4.2 3.4(L)  ?Chloride 98 - 111 mmol/L 103 99 94(L)  ?CO2 22 - 32 mmol/L 28 27 27   ?Calcium 8.9 - 10.3 mg/dL 9.3 8.6(L) 8.6(L)  ?Total Protein 6.5 - 8.1 g/dL 7.1 - -  ?Total Bilirubin 0.3 - 1.2 mg/dL 0.6 - -  ?Alkaline Phos 38 - 126 U/L 102 - -  ?AST 15 - 41 U/L 14(L) - -  ?ALT 0 - 44 U/L 17 - -  ? ? ?Lab Results  ?Component Value Date  ? WBC 15.6 (H) 06/24/2021  ? HGB 11.2 (L) 06/24/2021  ? HCT 34.8 (L) 06/24/2021  ? MCV 83.1 06/24/2021  ? PLT 247 06/24/2021  ? NEUTROABS 13.8 (H) 06/24/2021  ? ? 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 palpable right breast mass measuring about 7 cm x 5 and half centimeters, palpable right axillary lymph node with concern for skin invasion  ?  ?Treatment Plan: ?1. Neoadjuvant chemo with TCHP q 3 weeks X 6 followed by HP ?2. Bilateral mastectomies: Left mastectomy: Benign ?Right mastectomy: Residual microinvasive cancer status post neoadjuvant therapy, DCIS, 0/12 lymph nodes, ER +1%, PR negative, HER2 equivocal 2+ by IHC ?3. Adj XRT completed 02/23/2021 ?4. Adj Anti-estrogen therapy with tamoxifen to be started 06/08/2021 ?5. Neratinib ?--------------------------------------------------------------------------------------------------------- ?Current treatment: ?1.  Herceptin Perjeta maintenance to be completed  07/07/2021 (her daughter is having a baby shower on 07/08/2021) ?2. adjuvant tamoxifen started 04/24/2021 ?I recommended discontinuation of Perjeta because of her recent issues with her bowels. ?We will treat the next 2 rounds with just Herceptin alone. ? ?Echo 01/12/2021: EF of 60-65% ?Echo January 2023: EF 65% (trace pericardial effusion: I assured her that it is not clinically significant.)  ?  ?tamoxifen toxicities: She stopped tamoxifen after taking it for a few months.  Since the final pathology ER was only 1% we did not feel that the risks outweigh the benefits. ?Hospitalization 05/18/2021-06/13/2021 (small bowel obstruction) ? ?Port can be removed after the last Herceptin. ?Return to clinic in 6 months with labs and follow-up. ? ? ? ?Orders Placed This Encounter  ?Procedures  ? CT CHEST ABDOMEN PELVIS W CONTRAST  ?  Standing Status:   Future  ?  Standing Expiration Date:   06/25/2022  ?  Order Specific Question:   Is  patient pregnant?  ?  Answer:   No  ?  Order Specific Question:   Preferred imaging location?  ?  Answer:   [facility]  ?  Order Specific Question:   Release to patient  ?  Answer:   Immediate

## 2021-06-24 ENCOUNTER — Encounter: Payer: Self-pay | Admitting: *Deleted

## 2021-06-24 ENCOUNTER — Inpatient Hospital Stay: Payer: Managed Care, Other (non HMO)

## 2021-06-24 ENCOUNTER — Inpatient Hospital Stay: Payer: Managed Care, Other (non HMO) | Attending: Hematology and Oncology

## 2021-06-24 ENCOUNTER — Inpatient Hospital Stay: Payer: Managed Care, Other (non HMO) | Admitting: Dietician

## 2021-06-24 ENCOUNTER — Other Ambulatory Visit: Payer: Self-pay

## 2021-06-24 ENCOUNTER — Inpatient Hospital Stay: Payer: Managed Care, Other (non HMO) | Admitting: Hematology and Oncology

## 2021-06-24 DIAGNOSIS — C50411 Malignant neoplasm of upper-outer quadrant of right female breast: Secondary | ICD-10-CM

## 2021-06-24 DIAGNOSIS — Z5112 Encounter for antineoplastic immunotherapy: Secondary | ICD-10-CM | POA: Diagnosis present

## 2021-06-24 DIAGNOSIS — Z79810 Long term (current) use of selective estrogen receptor modulators (SERMs): Secondary | ICD-10-CM | POA: Insufficient documentation

## 2021-06-24 DIAGNOSIS — Z95828 Presence of other vascular implants and grafts: Secondary | ICD-10-CM

## 2021-06-24 DIAGNOSIS — Z170 Estrogen receptor positive status [ER+]: Secondary | ICD-10-CM | POA: Insufficient documentation

## 2021-06-24 LAB — CMP (CANCER CENTER ONLY)
ALT: 17 U/L (ref 0–44)
AST: 14 U/L — ABNORMAL LOW (ref 15–41)
Albumin: 4 g/dL (ref 3.5–5.0)
Alkaline Phosphatase: 102 U/L (ref 38–126)
Anion gap: 8 (ref 5–15)
BUN: 9 mg/dL (ref 6–20)
CO2: 28 mmol/L (ref 22–32)
Calcium: 9.3 mg/dL (ref 8.9–10.3)
Chloride: 103 mmol/L (ref 98–111)
Creatinine: 0.86 mg/dL (ref 0.44–1.00)
GFR, Estimated: >60 mL/min (ref >60)
Glucose, Bld: 96 mg/dL (ref 70–99)
Potassium: 3.8 mmol/L (ref 3.5–5.1)
Sodium: 139 mmol/L (ref 135–145)
Total Bilirubin: 0.6 mg/dL (ref 0.3–1.2)
Total Protein: 7.1 g/dL (ref 6.5–8.1)

## 2021-06-24 LAB — CBC WITH DIFFERENTIAL/PLATELET
Abs Immature Granulocytes: 0.06 10*3/uL (ref 0.00–0.07)
Basophils Absolute: 0 10*3/uL (ref 0.0–0.1)
Basophils Relative: 0 %
Eosinophils Absolute: 0 10*3/uL (ref 0.0–0.5)
Eosinophils Relative: 0 %
HCT: 34.8 % — ABNORMAL LOW (ref 36.0–46.0)
Hemoglobin: 11.2 g/dL — ABNORMAL LOW (ref 12.0–15.0)
Immature Granulocytes: 0 %
Lymphocytes Relative: 6 %
Lymphs Abs: 0.9 10*3/uL (ref 0.7–4.0)
MCH: 26.7 pg (ref 26.0–34.0)
MCHC: 32.2 g/dL (ref 30.0–36.0)
MCV: 83.1 fL (ref 80.0–100.0)
Monocytes Absolute: 0.8 10*3/uL (ref 0.1–1.0)
Monocytes Relative: 5 %
Neutro Abs: 13.8 10*3/uL — ABNORMAL HIGH (ref 1.7–7.7)
Neutrophils Relative %: 89 %
Platelets: 247 10*3/uL (ref 150–400)
RBC: 4.19 MIL/uL (ref 3.87–5.11)
RDW: 15.3 % (ref 11.5–15.5)
WBC: 15.6 10*3/uL — ABNORMAL HIGH (ref 4.0–10.5)
nRBC: 0 % (ref 0.0–0.2)

## 2021-06-24 MED ORDER — TRASTUZUMAB-ANNS CHEMO 150 MG IV SOLR
6.0000 mg/kg | Freq: Once | INTRAVENOUS | Status: AC
  Administered 2021-06-24: 399 mg via INTRAVENOUS
  Filled 2021-06-24: qty 19

## 2021-06-24 MED ORDER — SODIUM CHLORIDE 0.9 % IV SOLN: Freq: Once | INTRAVENOUS | Status: AC

## 2021-06-24 MED ORDER — SODIUM CHLORIDE 0.9% FLUSH
10.0000 mL | INTRAVENOUS | Status: DC | PRN
  Administered 2021-06-24: 10 mL

## 2021-06-24 MED ORDER — DIPHENHYDRAMINE HCL 25 MG PO CAPS
25.0000 mg | ORAL_CAPSULE | Freq: Once | ORAL | Status: AC
  Administered 2021-06-24: 25 mg via ORAL
  Filled 2021-06-24: qty 1

## 2021-06-24 MED ORDER — SODIUM CHLORIDE 0.9% FLUSH
10.0000 mL | Freq: Once | INTRAVENOUS | Status: AC
  Administered 2021-06-24: 10 mL

## 2021-06-24 MED ORDER — ACETAMINOPHEN 325 MG PO TABS
650.0000 mg | ORAL_TABLET | Freq: Once | ORAL | Status: AC
  Administered 2021-06-24: 650 mg via ORAL
  Filled 2021-06-24: qty 2

## 2021-06-24 MED ORDER — HEPARIN SOD (PORK) LOCK FLUSH 100 UNIT/ML IV SOLN
500.0000 [IU] | Freq: Once | INTRAVENOUS | Status: AC | PRN
  Administered 2021-06-24: 500 [IU]

## 2021-06-24 NOTE — Patient Instructions (Signed)
[facility]   ?Discharge Instructions: ?Thank you for choosing [facility] to provide your oncology and hematology care.  ? ?If you have a lab appointment with the [facility], please go directly to the [facility] and check in at the registration area. ?  ?Wear comfortable clothing and clothing appropriate for easy access to any Portacath or PICC line.  ? ?We strive to give you quality time with your provider. You may need to reschedule your appointment if you arrive late (15 or more minutes).  Arriving late affects you and other patients whose appointments are after yours.  Also, if you miss three or more appointments without notifying the office, you may be dismissed from the clinic at the provider?s discretion.    ?  ?For prescription refill requests, have your pharmacy contact our office and allow 72 hours for refills to be completed.   ? ?Today you received the following chemotherapy and/or immunotherapy agents: trastuzumab-anns    ?  ?To help prevent nausea and vomiting after your treatment, we encourage you to take your nausea medication as directed. ? ?BELOW ARE SYMPTOMS THAT SHOULD BE REPORTED IMMEDIATELY: ?*FEVER GREATER THAN 100.4 F (38 ?C) OR HIGHER ?*CHILLS OR SWEATING ?*NAUSEA AND VOMITING THAT IS NOT CONTROLLED WITH YOUR NAUSEA MEDICATION ?*UNUSUAL SHORTNESS OF BREATH ?*UNUSUAL BRUISING OR BLEEDING ?*URINARY PROBLEMS (pain or burning when urinating, or frequent urination) ?*BOWEL PROBLEMS (unusual diarrhea, constipation, pain near the anus) ?TENDERNESS IN MOUTH AND THROAT WITH OR WITHOUT PRESENCE OF ULCERS (sore throat, sores in mouth, or a toothache) ?UNUSUAL RASH, SWELLING OR PAIN  ?UNUSUAL VAGINAL DISCHARGE OR ITCHING  ? ?Items with * indicate a potential emergency and should be followed up as soon as possible or go to the Emergency Department if any problems should occur. ? ?Please show the CHEMOTHERAPY ALERT CARD or IMMUNOTHERAPY ALERT CARD at  check-in to the Emergency Department and triage nurse. ? ?Should you have questions after your visit or need to cancel or reschedule your appointment, please contact [facility]  Dept: [phone]  and follow the prompts.  Office hours are 8:00 a.m. to 4:30 p.m. Monday - Friday. Please note that voicemails left after 4:00 p.m. may not be returned until the following business day.  We are closed weekends and major holidays. You have access to a nurse at all times for urgent questions. Please call the main number to the clinic Dept: [phone] and follow the prompts. ? ? ?For any non-urgent questions, you may also contact your provider using MyChart. We now offer e-Visits for anyone 29 and older to request care online for non-urgent symptoms. For details visit [email or web address]. ?  ?Also download the MyChart app! Go to the app store, search "MyChart", open the app, select [facility], and log in with your MyChart username and password. ? ?Due to Covid, a mask is required upon entering the hospital/clinic. If you do not have a mask, one will be given to you upon arrival. For doctor visits, patients may have 1 support person aged 25 or older with them. For treatment visits, patients cannot have anyone with them due to current Covid guidelines and our immunocompromised population.  ? ?

## 2021-06-24 NOTE — Assessment & Plan Note (Signed)
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 palpable right breast mass measuring about 7 cm x 5 and half centimeters, palpable right axillary lymph node with concern for skin invasion? ?? ?Treatment Plan: ?1. Neoadjuvant chemo with TCHP q 3 weeks X 6 followed by HP ?2. Bilateral mastectomies: Left mastectomy: Benign ?Right mastectomy: Residual microinvasive cancer status post neoadjuvant therapy, DCIS, 0/12 lymph nodes, ER +1%, PR negative, HER2 equivocal 2+ by IHC ?3. Adj XRT?completed 02/23/2021 ?4. Adj Anti-estrogen therapy?with tamoxifen?to be started 06/08/2021 ?5. Neratinib ?--------------------------------------------------------------------------------------------------------- ?Current treatment: ?1. ?Herceptin Perjeta maintenance?to be completed 07/07/2021 (her daughter is having a baby shower on 07/08/2021) ?2.?adjuvant tamoxifen started 04/24/2021 ?? ?Echo 01/12/2021: EF of 60-65% ?Echo January 2023: EF 65% (trace pericardial effusion: I assured her that it is not clinically significant.)  ?? ?tamoxifen?toxicities:?She stopped tamoxifen after taking it for a few months ?Hospitalization 05/18/2021-06/13/2021 (small bowel obstruction) ? ?Return to clinic every 3 weeks for Herceptin Perjeta maintenance and every 6 weeks to follow-up with me. ?

## 2021-06-24 NOTE — Progress Notes (Signed)
Per MD okay to treat with HR 114 ?

## 2021-06-25 ENCOUNTER — Encounter: Payer: Self-pay | Admitting: Hematology and Oncology

## 2021-06-25 NOTE — Progress Notes (Signed)
Nutrition Assessment ? ? ?Reason for Assessment: Hospital f/u ? ? ?ASSESSMENT: 46 year old female with HER-2 positive right breast cancer. S/p bilateral mastectomies. She is currently receiving maintenance chemotherapy with Herceptin/Perjeta. Patient is under the care of Dr. Lindi Adie. ? ?Hospitalization 2/28-3/6 for small bowel obstruction ? ?Past medical history significant of Crohn's disease with ileosigmoid anastomosis, vit D deficiency, IDA ? ?Met with patient during infusion. She reports good appetite and intake since hospital discharge. Patient attributes this in part to current prednisone taper. She is now eating a low fiber diet. Prior to recent admissions for SBO, patient endorses making dietary changes, adopting mediterranean-style diet. Patient reports she was not educated on a low fiber diet s/p colectomy in 2021. Yesterday patient ate bowl of cereal for breakfast, eggs, grits, toast, for lunch, soft tacos with vegan meat and chips for dinner, bowl of ice cream for snack. She is drinking water and some fruit juice. Patient denies nausea, vomiting, diarrhea, constipation.  ? ?Nutrition Focused Physical Exam: deferred - noted mild fat depletion to upper arm region, mild muscle depletion to temple region per 3/2 NFPE completed by inpatient RD  ? ? ?Medications: mesalamine, miralax, prednisone ? ? ?Labs: reviewed ? ? ?Anthropometrics: Weights have decreased 7.5% (11 lbs) in one month which is significant for time frame. Per chart, pt weighed 147 lb 11.3 oz on 05/27/21.  ? ?Height: 5'4" ?Weight: 136 lb 4.8 oz ?UBW: 147-148 lb (Nov 2022-Feb 2023) ?BMI: 23.40 ? ? ?NUTRITION DIAGNOSIS: Food and nutrition related knowledge deficit related to altered GI function s/p subtotal colectomy as evidenced by SBO and reported no prior education on low-fiber diet recommendations ? ? ?INTERVENTION:  ?Continue eating low fiber diet - handout provided ?Encouraged small frequent meals and snacks with adequate calories and  protein ?Discussed good sources of lean proteins  ?Contact information provided ? ? ?MONITORING, EVALUATION, GOAL: Patient will tolerate increased calories and protein to minimize weight loss ? ? ?Next Visit: To be scheduled as needed ? ? ? ? ? ? ?

## 2021-06-27 ENCOUNTER — Telehealth: Payer: Self-pay | Admitting: Hematology and Oncology

## 2021-06-27 NOTE — Telephone Encounter (Signed)
Scheduled appointment per 3/17 los. Patient is aware. ?

## 2021-07-01 ENCOUNTER — Encounter: Payer: Self-pay | Admitting: Plastic Surgery

## 2021-07-04 ENCOUNTER — Ambulatory Visit: Payer: Managed Care, Other (non HMO) | Attending: General Surgery

## 2021-07-04 ENCOUNTER — Other Ambulatory Visit: Payer: Self-pay

## 2021-07-04 VITALS — Wt 135.0 lb

## 2021-07-04 DIAGNOSIS — Z483 Aftercare following surgery for neoplasm: Secondary | ICD-10-CM | POA: Insufficient documentation

## 2021-07-04 NOTE — H&P (View-Only) (Signed)
? ?  Patient ID: [name], female    DOB: November 16, 1975, 46 y.o.   MRN: [id number] ? ?Chief Complaint  ?Patient presents with  ? Pre-op Exam  ? ? ?  ICD-10-CM   ?1. Malignant neoplasm of upper-outer quadrant of right breast in female, estrogen receptor positive ([place])  C50.411   ? Z17.0   ?  ? ? ? ?History of Present Illness: ?[name] is a 46 y.o.  female  with a history of bilateral NSM with expander placement 11/09/2020.  She presents for preoperative evaluation for upcoming procedure, removal of bilateral tissue expanders with replacement of breast implants, scheduled for 07/25/2021 with Dr. Claudia Desanctis. ? ?The patient has not had problems with anesthesia.  Patient tells me that her left nipple is a bit upward directed whereas her right nipple is downward pointing.  She is hoping that this can be adjusted during surgery.  She reports that she was a small B cup prior to reconstruction and she would ideally be a full C cup after implant exchange.  She is finishing a prednisone taper on 07/15/2021 after having Crohn's flare.  She tells me that she has completed chemotherapy and has no plans to take tamoxifen.  She has her final immunotherapy infusion early April. ? ?Summary of Previous Visit: She was last seen here in clinic by Dr. Claudia Desanctis on 04/28/2021.  At that time, she had just finished radiation and is on Herceptin which should be concluding in March 2023.  Patient expressed that she would like to have her port removed and have a second phase of her reconstruction in April, if possible.  She expressed concern regarding the asymmetric areolar position.  The areola on the left is higher than the areola on the right.  Current expander volume is 420/375 cc each side.  Discussed possible capsulotomy in the left side to encourage the implant to migrate higher in effort to bring areola inferiorly.  Could also raise the areola on the right side, but limited due to radiation and potential for poor wound healing.  Plan is to  coordinate with [name] to have port removed at the same time. ? 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  Denies any significant physical component.  Offered FMLA if needed, but she would prefer to try to avoid. ? ?PMH Significant for: Crohn's disease s/p colectomy and ileorectal anastomosis, SBO, recent Crohn's flare 05/2021, right-sided breast cancer on chemotherapy. ? ? ?Past Medical History: ?Allergies: ?Allergies  ?Allergen Reactions  ? Nsaids Other (See Comments)  ?  Crohns disease/ IBD  ? Other   ?  BLOOD PRODUCT REFUSAL  ? ? ?Current Medications: ? ?Current Outpatient Medications:  ?  acetaminophen (TYLENOL) 500 MG tablet, Take 500-1,000 mg by mouth every 6 (six) hours as needed for mild pain., Disp: , Rfl:  ?  lidocaine-prilocaine (EMLA) cream, Apply 1 application topically as needed (Prior to port access)., Disp: 30 g, Rfl: 2 ?  mesalamine (LIALDA) 1.2 g EC tablet, Take 2 tablets (2.4 g total) by mouth 2 (two) times daily with a meal., Disp: 120 tablet, Rfl: 11 ?  Multiple Vitamins-Minerals (MULTIVITAMIN WITH MINERALS) tablet, Take 1 tablet by mouth daily., Disp: , Rfl:  ?  polyethylene glycol (MIRALAX) 17 g packet, Take 17 g by mouth daily as needed. (Patient taking differently: Take 17 g by mouth daily as needed for mild constipation.), Disp: 14 each, Rfl: 0 ?  predniSONE (DELTASONE) 10 MG tablet,  Take 4 tablets (40 mg total) by mouth daily with breakfast for 6 days, THEN 3.5 tablets (35 mg total) daily with breakfast for 7 days, THEN 3 tablets (30 mg total) daily with breakfast for 7 days, THEN 2.5 tablets (25 mg total) daily with breakfast for 7 days, THEN 2 tablets (20 mg total) daily with breakfast., Disp: 147 tablet, Rfl: 0 ? ?Past Medical Problems: ?Past Medical History:  ?Diagnosis Date  ? Cancer [facility])   ? Colon stricture ([place]) 05/07/2019  ? Crohn's colitis, with intestinal obstruction ([place]) 09/17/2013  ? Dx 2006 - right and left colon involved 2011 - pan-colitis  09/17/2013 left colitis 60-20 cm March 2021 subtotal colectomy ileosigmoid anastomosis for colonic strictures-Dr. Marcello Moores    ? Crohn's disease ([place])   ? Family history of breast cancer 06/11/2020  ? 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 10/17/2010  ? Rectal bleeding   ? Uterine fibroid   ? Vitamin D deficiency 09/24/2013  ? ? ?Past Surgical History: ?Past Surgical History:  ?Procedure Laterality Date  ? AXILLARY LYMPH NODE DISSECTION Right 11/09/2020  ? Procedure: RIGHT AXILLARY LYMPH NODE DISSECTION;  Surgeon: [name], MD;  Location: [place];  Service: General;  Laterality: Right;  ? BIOPSY  07/01/2020  ? Procedure: BIOPSY;  Surgeon: Yetta Flock, MD;  Location: [facility];  Service: Gastroenterology;;  ? BIOPSY  01/27/2021  ? Procedure: BIOPSY;  Surgeon: Yetta Flock, MD;  Location: [facility];  Service: Gastroenterology;;  ? BREAST RECONSTRUCTION WITH PLACEMENT OF TISSUE EXPANDER AND FLEX HD (ACELLULAR HYDRATED DERMIS) Bilateral 11/09/2020  ? Procedure: BILATERAL BREAST RECONSTRUCTION WITH PLACEMENT OF TISSUE EXPANDER AND FLEX HD (ACELLULAR HYDRATED DERMIS);  Surgeon: [name], MD;  Location: [place];  Service: Plastics;  Laterality: Bilateral;  ? COLECTOMY  06/27/2019  ? COLONOSCOPY  2015  ? FLEXIBLE SIGMOIDOSCOPY N/A 07/01/2020  ? Procedure: FLEXIBLE SIGMOIDOSCOPY;  Surgeon: Yetta Flock, MD;  Location: [facility];  Service: Gastroenterology;  Laterality: N/A;  ? FLEXIBLE SIGMOIDOSCOPY N/A 01/27/2021  ? Procedure: FLEXIBLE SIGMOIDOSCOPY;  Surgeon: Yetta Flock, MD;  Location: [facility];  Service: Gastroenterology;  Laterality: N/A;  ? NIPPLE SPARING MASTECTOMY Bilateral 11/09/2020  ? Procedure: BILATERAL NIPPLE SPARING MASTECTOMY;  Surgeon: [name], MD;  Location: [place];  Service: General;  Laterality: Bilateral;  ? PORTACATH PLACEMENT N/A 06/23/2020  ?  Procedure: INSERTION PORT-A-CATH;  Surgeon: [name], MD;  Location: [place];  Service: General;  Laterality: N/A;  START TIME OF 11:00 AM FOR 54 MINUTES [place]  ? ? ?Social History: ?Social History  ? ?Socioeconomic History  ? Marital status: Significant Other  ?  Spouse name: Not on file  ? Number of children: 1  ? Years of education: 78  ? Highest education level: Not on file  ?Occupational History  ? Occupation: Medical Coder   ?Tobacco Use  ? Smoking status: Former  ?  Types: Cigarettes  ? Smokeless tobacco: Never  ?Vaping Use  ? Vaping Use: Never used  ?Substance and Sexual Activity  ? Alcohol use: Yes  ?  Alcohol/week: 1.0 - 2.0 standard drink  ?  Types: 1 - 2 Glasses of wine per week  ? Drug use: No  ? Sexual activity: Yes  ?  Partners: Male  ?  Comment: Intermittent protection not consistent  ?Other Topics Concern  ? Not on file  ?Social History Narrative  ? Single, 1 daughter born  2001 approximately  ? Medical coder for [employer]  ? She is a former smoker, occasional alcohol no drug use  ? ?Social Determinants of Health  ? ?Financial Resource Strain: Not on file  ?Food Insecurity: Not on file  ?Transportation Needs: Not on file  ?Physical Activity: Not on file  ?Stress: Not on file  ?Social Connections: Not on file  ?Intimate Partner Violence: Not At Risk  ? Fear of Current or Ex-Partner: No  ? Emotionally Abused: No  ? Physically Abused: No  ? Sexually Abused: No  ? ? ?Family History: ?Family History  ?Problem Relation Age of Onset  ? Breast cancer Maternal Aunt   ?     dx unknown age  ? Diabetes Father   ? Diabetes Paternal Aunt   ? Breast cancer Cousin   ?     maternal cousin; dx unknown age  ? Esophageal cancer Neg Hx   ? Rectal cancer Neg Hx   ? Stomach cancer Neg Hx   ? Colon polyps Neg Hx   ? Colon cancer Neg Hx   ? ? ?Review of Systems: ?ROS ?Denies any recent chest pain, difficulty breathing, fevers, or traumas. ? ?Physical Exam: ?Vital Signs ?BP (!) 141/89 (BP  Location: Left Arm, Patient Position: Sitting, Cuff Size: Small)   Pulse (!) 107   Ht 5' 4"  (1.626 m)   Wt 137 lb 9.6 oz (62.4 kg)   SpO2 99%   BMI 23.62 kg/m?  ? ?Physical Exam ?Constitutional:   ?   General: N

## 2021-07-04 NOTE — Progress Notes (Signed)
? ?  Patient ID: [name], female    DOB: 20-Aug-1975, 46 y.o.   MRN: [id number] ? ?Chief Complaint  ?Patient presents with  ? Pre-op Exam  ? ? ?  ICD-10-CM   ?1. Malignant neoplasm of upper-outer quadrant of right breast in female, estrogen receptor positive ([place])  C50.411   ? Z17.0   ?  ? ? ? ?History of Present Illness: ?[name] is a 46 y.o.  female  with a history of bilateral NSM with expander placement 11/09/2020.  She presents for preoperative evaluation for upcoming procedure, removal of bilateral tissue expanders with replacement of breast implants, scheduled for 07/25/2021 with Dr. Claudia Desanctis. ? ?The patient has not had problems with anesthesia.  Patient tells me that her left nipple is a bit upward directed whereas her right nipple is downward pointing.  She is hoping that this can be adjusted during surgery.  She reports that she was a small B cup prior to reconstruction and she would ideally be a full C cup after implant exchange.  She is finishing a prednisone taper on 07/15/2021 after having Crohn's flare.  She tells me that she has completed chemotherapy and has no plans to take tamoxifen.  She has her final immunotherapy infusion early April. ? ?Summary of Previous Visit: She was last seen here in clinic by Dr. Claudia Desanctis on 04/28/2021.  At that time, she had just finished radiation and is on Herceptin which should be concluding in March 2023.  Patient expressed that she would like to have her port removed and have a second phase of her reconstruction in April, if possible.  She expressed concern regarding the asymmetric areolar position.  The areola on the left is higher than the areola on the right.  Current expander volume is 420/375 cc each side.  Discussed possible capsulotomy in the left side to encourage the implant to migrate higher in effort to bring areola inferiorly.  Could also raise the areola on the right side, but limited due to radiation and potential for poor wound healing.  Plan is to  coordinate with [name] to have port removed at the same time. ? 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  Denies any significant physical component.  Offered FMLA if needed, but she would prefer to try to avoid. ? ?PMH Significant for: Crohn's disease s/p colectomy and ileorectal anastomosis, SBO, recent Crohn's flare 05/2021, right-sided breast cancer on chemotherapy. ? ? ?Past Medical History: ?Allergies: ?Allergies  ?Allergen Reactions  ? Nsaids Other (See Comments)  ?  Crohns disease/ IBD  ? Other   ?  BLOOD PRODUCT REFUSAL  ? ? ?Current Medications: ? ?Current Outpatient Medications:  ?  acetaminophen (TYLENOL) 500 MG tablet, Take 500-1,000 mg by mouth every 6 (six) hours as needed for mild pain., Disp: , Rfl:  ?  lidocaine-prilocaine (EMLA) cream, Apply 1 application topically as needed (Prior to port access)., Disp: 30 g, Rfl: 2 ?  mesalamine (LIALDA) 1.2 g EC tablet, Take 2 tablets (2.4 g total) by mouth 2 (two) times daily with a meal., Disp: 120 tablet, Rfl: 11 ?  Multiple Vitamins-Minerals (MULTIVITAMIN WITH MINERALS) tablet, Take 1 tablet by mouth daily., Disp: , Rfl:  ?  polyethylene glycol (MIRALAX) 17 g packet, Take 17 g by mouth daily as needed. (Patient taking differently: Take 17 g by mouth daily as needed for mild constipation.), Disp: 14 each, Rfl: 0 ?  predniSONE (DELTASONE) 10 MG tablet,  Take 4 tablets (40 mg total) by mouth daily with breakfast for 6 days, THEN 3.5 tablets (35 mg total) daily with breakfast for 7 days, THEN 3 tablets (30 mg total) daily with breakfast for 7 days, THEN 2.5 tablets (25 mg total) daily with breakfast for 7 days, THEN 2 tablets (20 mg total) daily with breakfast., Disp: 147 tablet, Rfl: 0 ? ?Past Medical Problems: ?Past Medical History:  ?Diagnosis Date  ? Cancer [facility])   ? Colon stricture ([place]) 05/07/2019  ? Crohn's colitis, with intestinal obstruction ([place]) 09/17/2013  ? Dx 2006 - right and left colon involved 2011 - pan-colitis  09/17/2013 left colitis 60-20 cm March 2021 subtotal colectomy ileosigmoid anastomosis for colonic strictures-Dr. Marcello Moores    ? Crohn's disease ([place])   ? Family history of breast cancer 06/11/2020  ? 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 10/17/2010  ? Rectal bleeding   ? Uterine fibroid   ? Vitamin D deficiency 09/24/2013  ? ? ?Past Surgical History: ?Past Surgical History:  ?Procedure Laterality Date  ? AXILLARY LYMPH NODE DISSECTION Right 11/09/2020  ? Procedure: RIGHT AXILLARY LYMPH NODE DISSECTION;  Surgeon: [name], MD;  Location: [place];  Service: General;  Laterality: Right;  ? BIOPSY  07/01/2020  ? Procedure: BIOPSY;  Surgeon: Yetta Flock, MD;  Location: [facility];  Service: Gastroenterology;;  ? BIOPSY  01/27/2021  ? Procedure: BIOPSY;  Surgeon: Yetta Flock, MD;  Location: [facility];  Service: Gastroenterology;;  ? BREAST RECONSTRUCTION WITH PLACEMENT OF TISSUE EXPANDER AND FLEX HD (ACELLULAR HYDRATED DERMIS) Bilateral 11/09/2020  ? Procedure: BILATERAL BREAST RECONSTRUCTION WITH PLACEMENT OF TISSUE EXPANDER AND FLEX HD (ACELLULAR HYDRATED DERMIS);  Surgeon: [name], MD;  Location: [place];  Service: Plastics;  Laterality: Bilateral;  ? COLECTOMY  06/27/2019  ? COLONOSCOPY  2015  ? FLEXIBLE SIGMOIDOSCOPY N/A 07/01/2020  ? Procedure: FLEXIBLE SIGMOIDOSCOPY;  Surgeon: Yetta Flock, MD;  Location: [facility];  Service: Gastroenterology;  Laterality: N/A;  ? FLEXIBLE SIGMOIDOSCOPY N/A 01/27/2021  ? Procedure: FLEXIBLE SIGMOIDOSCOPY;  Surgeon: Yetta Flock, MD;  Location: [facility];  Service: Gastroenterology;  Laterality: N/A;  ? NIPPLE SPARING MASTECTOMY Bilateral 11/09/2020  ? Procedure: BILATERAL NIPPLE SPARING MASTECTOMY;  Surgeon: [name], MD;  Location: [place];  Service: General;  Laterality: Bilateral;  ? PORTACATH PLACEMENT N/A 06/23/2020  ?  Procedure: INSERTION PORT-A-CATH;  Surgeon: [name], MD;  Location: [place];  Service: General;  Laterality: N/A;  START TIME OF 11:00 AM FOR 76 MINUTES [place]  ? ? ?Social History: ?Social History  ? ?Socioeconomic History  ? Marital status: Significant Other  ?  Spouse name: Not on file  ? Number of children: 1  ? Years of education: 24  ? Highest education level: Not on file  ?Occupational History  ? Occupation: Medical Coder   ?Tobacco Use  ? Smoking status: Former  ?  Types: Cigarettes  ? Smokeless tobacco: Never  ?Vaping Use  ? Vaping Use: Never used  ?Substance and Sexual Activity  ? Alcohol use: Yes  ?  Alcohol/week: 1.0 - 2.0 standard drink  ?  Types: 1 - 2 Glasses of wine per week  ? Drug use: No  ? Sexual activity: Yes  ?  Partners: Male  ?  Comment: Intermittent protection not consistent  ?Other Topics Concern  ? Not on file  ?Social History Narrative  ? Single, 1 daughter born  2001 approximately  ? Medical coder for [employer]  ? She is a former smoker, occasional alcohol no drug use  ? ?Social Determinants of Health  ? ?Financial Resource Strain: Not on file  ?Food Insecurity: Not on file  ?Transportation Needs: Not on file  ?Physical Activity: Not on file  ?Stress: Not on file  ?Social Connections: Not on file  ?Intimate Partner Violence: Not At Risk  ? Fear of Current or Ex-Partner: No  ? Emotionally Abused: No  ? Physically Abused: No  ? Sexually Abused: No  ? ? ?Family History: ?Family History  ?Problem Relation Age of Onset  ? Breast cancer Maternal Aunt   ?     dx unknown age  ? Diabetes Father   ? Diabetes Paternal Aunt   ? Breast cancer Cousin   ?     maternal cousin; dx unknown age  ? Esophageal cancer Neg Hx   ? Rectal cancer Neg Hx   ? Stomach cancer Neg Hx   ? Colon polyps Neg Hx   ? Colon cancer Neg Hx   ? ? ?Review of Systems: ?ROS ?Denies any recent chest pain, difficulty breathing, fevers, or traumas. ? ?Physical Exam: ?Vital Signs ?BP (!) 141/89 (BP  Location: Left Arm, Patient Position: Sitting, Cuff Size: Small)   Pulse (!) 107   Ht 5' 4"  (1.626 m)   Wt 137 lb 9.6 oz (62.4 kg)   SpO2 99%   BMI 23.62 kg/m?  ? ?Physical Exam ?Constitutional:   ?   General: N

## 2021-07-04 NOTE — Therapy (Signed)
?OUTPATIENT PHYSICAL THERAPY SOZO SCREENING NOTE ? ? ?Patient Name: [name] ?MRN: [id number] ?DOB:1976-01-21, 46 y.o., female ?Today's Date: 07/04/2021 ? ?PCP: Pcp, No ?REFERRING PROVIDER: [name], MD ? ? PT End of Session - 07/04/21 1531   ? ? Visit Number 2   # unchanged due to screen only  ? PT Start Time 1522   ? PT Stop Time 1528   ? PT Time Calculation (min) 6 min   ? Activity Tolerance Patient tolerated treatment well   ? Behavior During Therapy [facility] for tasks assessed/performed   ? ?  ?  ? ?  ? ? ?Past Medical History:  ?Diagnosis Date  ? Cancer [facility])   ? Colon stricture ([place]) 05/07/2019  ? Crohn's colitis, with intestinal obstruction ([place]) 09/17/2013  ? Dx 2006 - right and left colon involved 2011 - pan-colitis 09/17/2013 left colitis 60-20 cm March 2021 subtotal colectomy ileosigmoid anastomosis for colonic strictures-Dr. Marcello Moores    ? Crohn's disease ([place])   ? Family history of breast cancer 06/11/2020  ? 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 10/17/2010  ? Rectal bleeding   ? Uterine fibroid   ? Vitamin D deficiency 09/24/2013  ? ?Past Surgical History:  ?Procedure Laterality Date  ? AXILLARY LYMPH NODE DISSECTION Right 11/09/2020  ? Procedure: RIGHT AXILLARY LYMPH NODE DISSECTION;  Surgeon: [name], MD;  Location: [place];  Service: General;  Laterality: Right;  ? BIOPSY  07/01/2020  ? Procedure: BIOPSY;  Surgeon: Yetta Flock, MD;  Location: [facility];  Service: Gastroenterology;;  ? BIOPSY  01/27/2021  ? Procedure: BIOPSY;  Surgeon: Yetta Flock, MD;  Location: [facility];  Service: Gastroenterology;;  ? BREAST RECONSTRUCTION WITH PLACEMENT OF TISSUE EXPANDER AND FLEX HD (ACELLULAR HYDRATED DERMIS) Bilateral 11/09/2020  ? Procedure: BILATERAL BREAST RECONSTRUCTION WITH PLACEMENT OF TISSUE EXPANDER AND FLEX HD (ACELLULAR HYDRATED DERMIS);  Surgeon: [name], MD;  Location: [place];   Service: Plastics;  Laterality: Bilateral;  ? COLECTOMY  06/27/2019  ? COLONOSCOPY  2015  ? FLEXIBLE SIGMOIDOSCOPY N/A 07/01/2020  ? Procedure: FLEXIBLE SIGMOIDOSCOPY;  Surgeon: Yetta Flock, MD;  Location: [facility];  Service: Gastroenterology;  Laterality: N/A;  ? FLEXIBLE SIGMOIDOSCOPY N/A 01/27/2021  ? Procedure: FLEXIBLE SIGMOIDOSCOPY;  Surgeon: Yetta Flock, MD;  Location: [facility];  Service: Gastroenterology;  Laterality: N/A;  ? NIPPLE SPARING MASTECTOMY Bilateral 11/09/2020  ? Procedure: BILATERAL NIPPLE SPARING MASTECTOMY;  Surgeon: [name], MD;  Location: [place];  Service: General;  Laterality: Bilateral;  ? PORTACATH PLACEMENT N/A 06/23/2020  ? Procedure: INSERTION PORT-A-CATH;  Surgeon: [name], MD;  Location: [place];  Service: General;  Laterality: N/A;  START TIME OF 11:00 AM FOR 5 MINUTES [place]  ? ?Patient Active Problem List  ? Diagnosis Date Noted  ? Leucocytosis 05/27/2021  ? Crohn's disease of small and large intestines with complication ([place])   ? History of Crohn's disease   ? Breast cancer ([place]) 11/09/2020  ? Port-A-Cath in place 07/15/2020  ? Genetic testing 06/25/2020  ? Family history of breast cancer 06/11/2020  ? Malignant neoplasm of upper-outer quadrant of right breast in female, estrogen receptor positive ([place]) 06/04/2020  ? Abnormal finding present on diagnostic imaging of uterus 05/07/2019  ? Vitamin D deficiency 09/24/2013  ? 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 10/17/2010  ? ? ?REFERRING DIAG: right breast cancer at risk for  lymphedema ? ?THERAPY DIAG:  ?Aftercare following surgery for neoplasm ? ?PERTINENT HISTORY: Patient was diagnosed on 06/04/2020 with right grade III invasive ductal carcinoma breast cancer. She neoadjuvant chemotherapy from 06/25/2020 - 10/29/2020 followed by bilateral mastectomies with expanders placed on 11/09/2020 with 0/12 axillary nodes  positive for cancer. It is ER positive, PR negative, and HER2 negative with a Ki67 of 40%. ? ?PRECAUTIONS: right UE Lymphedema risk, None ? ?SUBJECTIVE: Pt returns for her 1 month SOZO screen after testing outside of baseline. "I've worn my compression sleeve every day. I had noticed some fullness in my axilla initially but this improved over the past month.  ? ?PAIN:  ?Are you having pain? No ? ?SOZO SCREENING: ?Patient was assessed today using the SOZO machine to determine the lymphedema index score. This was compared to her baseline score. It was determined that she is within the recommended range when compared to her baseline and no further action is needed at this time. She will continue SOZO screenings. These are done every 3 months for 2 years post operatively followed by every 6 months for 2 years, and then annually. ? ? ? ? ? ?Otelia Limes, PTA ?07/04/2021, 3:33 PM ? ?  ? ?

## 2021-07-07 ENCOUNTER — Other Ambulatory Visit: Payer: Managed Care, Other (non HMO)

## 2021-07-07 ENCOUNTER — Encounter: Payer: Self-pay | Admitting: *Deleted

## 2021-07-07 ENCOUNTER — Ambulatory Visit: Payer: Managed Care, Other (non HMO)

## 2021-07-07 ENCOUNTER — Other Ambulatory Visit: Payer: Self-pay

## 2021-07-07 ENCOUNTER — Ambulatory Visit (INDEPENDENT_AMBULATORY_CARE_PROVIDER_SITE_OTHER): Payer: Managed Care, Other (non HMO) | Admitting: Physician Assistant

## 2021-07-07 VITALS — BP 141/89 | HR 107 | Ht 64.0 in | Wt 137.6 lb

## 2021-07-07 DIAGNOSIS — Z170 Estrogen receptor positive status [ER+]: Secondary | ICD-10-CM

## 2021-07-07 DIAGNOSIS — C50411 Malignant neoplasm of upper-outer quadrant of right female breast: Secondary | ICD-10-CM

## 2021-07-07 MED ORDER — HYDROCODONE-ACETAMINOPHEN 5-325 MG PO TABS: 1.0000 | ORAL_TABLET | Freq: Four times a day (QID) | ORAL | 0 refills | Status: DC | PRN

## 2021-07-07 MED ORDER — ONDANSETRON 4 MG PO TBDP: 4.0000 mg | ORAL_TABLET | Freq: Three times a day (TID) | ORAL | 0 refills | Status: DC | PRN

## 2021-07-08 ENCOUNTER — Ambulatory Visit: Payer: Managed Care, Other (non HMO)

## 2021-07-08 ENCOUNTER — Other Ambulatory Visit: Payer: Managed Care, Other (non HMO)

## 2021-07-13 ENCOUNTER — Ambulatory Visit (INDEPENDENT_AMBULATORY_CARE_PROVIDER_SITE_OTHER): Payer: Managed Care, Other (non HMO) | Admitting: Internal Medicine

## 2021-07-13 ENCOUNTER — Encounter: Payer: Self-pay | Admitting: Internal Medicine

## 2021-07-13 VITALS — BP 122/70 | HR 115 | Ht 64.0 in | Wt 138.0 lb

## 2021-07-13 DIAGNOSIS — K50819 Crohn's disease of both small and large intestine with unspecified complications: Secondary | ICD-10-CM | POA: Diagnosis not present

## 2021-07-13 DIAGNOSIS — T50905D Adverse effect of unspecified drugs, medicaments and biological substances, subsequent encounter: Secondary | ICD-10-CM

## 2021-07-13 NOTE — Patient Instructions (Signed)
Continue your low fiber diet and follow the prednisone taper. ? ?Due to recent changes in healthcare laws, you may see the results of your imaging and laboratory studies on MyChart before your provider has had a chance to review them.  We understand that in some cases there may be results that are confusing or concerning to you. Not all laboratory results come back in the same time frame and the provider may be waiting for multiple results in order to interpret others.  Please give [place] 48 hours in order for your provider to thoroughly review all the results before contacting the office for clarification of your results.  ? ?I appreciate the opportunity to care for you. ?[name], MD, [facility] ? ? ? ?

## 2021-07-13 NOTE — Progress Notes (Signed)
? ?[name] y.o. 06/26/1975 [id number] ? ?Assessment & Plan:  ? ?Encounter Diagnoses  ?Name Primary?  ? Crohn's disease of small and large intestines with complication ([place]) Yes  ? Medication side effect suspected ? Perjeta , subsequent encounter   ? ? ?Cause of problem not clear.  Adhesions could be an issue as well.  Oncology thinks it might of been Perjeta. ? ?I reviewed that we are uncertain of the exact cause it does not seem to be Crohn's though she got better with prednisone which would support that but that might support Perjeta-caused inflammation as well. ? ?Prednisone will be tapered over the next 2 weeks and she will have her breast surgery for the implants. ? ?I will see her in 2 months to reassess and decide neck steps.  We need to decide whether we follow conservatively or we are assess objectively with endoscopic evaluation plus or minus imaging. ? ? ?I think it will make sense to do a sigmoidoscopy ileoscopy again.  I would probably start with that.  I think she will have ongoing CT scanning related to her breast cancer follow-up.  However CT enterography would be more specific. ? ? ? ?Subjective:  ? ?Chief Complaint: Follow-up of bowel obstruction and Crohn's disease ? ?HPI ?46 year old African-American woman with a history of Crohn's disease status post subtotal colectomy for severe Crohn's colitis and stricture, who has been in and out of the hospital with bowel obstructions over the past several months during her Herceptin Perjeta therapy for breast cancer.  She had a sigmoidoscopy/ileoscopy last fall which showed slight inflammation in the rectum and in the ileum but overall did not represent any or show any obstruction or severe inflammation.  She was admitted recently and discharged in early March, she did not rapidly improve with conservative therapy so we added steroids because we thought it might be a Crohn's flare and she started to improve and she is much better and  tolerating a low fiber diet at this point.  She thinks that makes a big difference in her quality of life and intends to continue.  She saw Dr. Lindi Adie in follow-up recently and he wondered if Perjeta might have been contributing to her problems causing these obstructions and inflammatory situations.  It has been dropped from her regimen for the last 2 treatments. ? ?She is now on 20 mg of a prednisone taper.  She has upcoming breast implant surgery in about 2 weeks and would like to be off the steroids.  She is back on Lialda she had been off that for a while and did not have any problems but it was restarted in the past several months. ? ?CT abdomen and pelvis with contrast 06/07/2021 ?IMPRESSION: ?1. Progressive high-grade small bowel obstruction, with abrupt ?transition point at the site of prior ileo sigmoid anastomosis ?within the central upper pelvis. Increasing caliber of the small ?bowel with diffuse gas fluid levels again noted. ?2. Fibroid uterus. ? ?Allergies  ?Allergen Reactions  ? Nsaids Other (See Comments)  ?  Crohns disease/ IBD  ? Other   ?  BLOOD PRODUCT REFUSAL  ? ?Current Meds  ?Medication Sig  ? acetaminophen (TYLENOL) 500 MG tablet Take 500-1,000 mg by mouth every 6 (six) hours as needed for mild pain.  ? lidocaine-prilocaine (EMLA) cream Apply 1 application topically as needed (Prior to port access).  ? mesalamine (LIALDA) 1.2 g EC tablet Take 2 tablets (2.4 g total) by mouth 2 (two) times daily with  a meal.  ? Multiple Vitamins-Minerals (MULTIVITAMIN WITH MINERALS) tablet Take 1 tablet by mouth daily.  ? predniSONE (DELTASONE) 10 MG tablet Take 4 tablets (40 mg total) by mouth daily with breakfast for 6 days, THEN 3.5 tablets (35 mg total) daily with breakfast for 7 days, THEN 3 tablets (30 mg total) daily with breakfast for 7 days, THEN 2.5 tablets (25 mg total) daily with breakfast for 7 days, THEN 2 tablets (20 mg total) daily with breakfast.  ? ?Past Medical History:  ?Diagnosis Date  ?  Cancer [facility])   ? Colon stricture ([place]) 05/07/2019  ? Crohn's colitis, with intestinal obstruction ([place]) 09/17/2013  ? Dx 2006 - right and left colon involved 2011 - pan-colitis 09/17/2013 left colitis 60-20 cm March 2021 subtotal colectomy ileosigmoid anastomosis for colonic strictures-Dr. Marcello Moores    ? Crohn's disease ([place])   ? Family history of breast cancer 06/11/2020  ? 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 10/17/2010  ? Rectal bleeding   ? Uterine fibroid   ? Vitamin D deficiency 09/24/2013  ? ?Past Surgical History:  ?Procedure Laterality Date  ? AXILLARY LYMPH NODE DISSECTION Right 11/09/2020  ? Procedure: RIGHT AXILLARY LYMPH NODE DISSECTION;  Surgeon: [name], MD;  Location: [facility];  Service: General;  Laterality: Right;  ? BIOPSY  07/01/2020  ? Procedure: BIOPSY;  Surgeon: Yetta Flock, MD;  Location: [facility];  Service: Gastroenterology;;  ? BIOPSY  01/27/2021  ? Procedure: BIOPSY;  Surgeon: Yetta Flock, MD;  Location: [facility];  Service: Gastroenterology;;  ? BREAST RECONSTRUCTION WITH PLACEMENT OF TISSUE EXPANDER AND FLEX HD (ACELLULAR HYDRATED DERMIS) Bilateral 11/09/2020  ? Procedure: BILATERAL BREAST RECONSTRUCTION WITH PLACEMENT OF TISSUE EXPANDER AND FLEX HD (ACELLULAR HYDRATED DERMIS);  Surgeon: [name], MD;  Location: [facility];  Service: Plastics;  Laterality: Bilateral;  ? COLECTOMY  06/27/2019  ? COLONOSCOPY  2015  ? FLEXIBLE SIGMOIDOSCOPY N/A 07/01/2020  ? Procedure: FLEXIBLE SIGMOIDOSCOPY;  Surgeon: Yetta Flock, MD;  Location: [facility];  Service: Gastroenterology;  Laterality: N/A;  ? FLEXIBLE SIGMOIDOSCOPY N/A 01/27/2021  ? Procedure: FLEXIBLE SIGMOIDOSCOPY;  Surgeon: Yetta Flock, MD;  Location: [facility];  Service: Gastroenterology;  Laterality: N/A;  ? NIPPLE SPARING MASTECTOMY Bilateral 11/09/2020  ? Procedure: BILATERAL NIPPLE SPARING MASTECTOMY;  Surgeon:  [name], MD;  Location: [facility];  Service: General;  Laterality: Bilateral;  ? PORTACATH PLACEMENT N/A 06/23/2020  ? Procedure: INSERTION PORT-A-CATH;  Surgeon: [name], MD;  Location: [facility];  Service: General;  Laterality: N/A;  START TIME OF 11:00 AM FOR 8 MINUTES [place] [facility]  ? ?Social History  ? ?Social History Narrative  ? Single, 1 daughter born 2001 approximately  ? Medical coder for [employer]  ? She is a former smoker, occasional alcohol no drug use  ? ?family history includes Breast cancer in her cousin and maternal aunt; Diabetes in her father and paternal aunt. ? ? ?Review of Systems ?See HPI ? ?Objective:  ? Physical Exam ?BP 122/70   Pulse (!) 115   Ht 5' 4"  (1.626 m)   Wt 138 lb (62.6 kg)   SpO2 95%   BMI 23.69 kg/m?  ?Abdomen is soft and nontender bowel sounds are present well-healed surgical scars ? ?Data reviewed include hospital records and labs from this year and last year and imaging as well.  Sigmoidoscopy ileoscopy from October 2022 including pathology.  Oncology notes. ? ?

## 2021-07-14 ENCOUNTER — Encounter (HOSPITAL_BASED_OUTPATIENT_CLINIC_OR_DEPARTMENT_OTHER): Payer: Self-pay | Admitting: Plastic Surgery

## 2021-07-14 ENCOUNTER — Other Ambulatory Visit: Payer: Self-pay

## 2021-07-14 ENCOUNTER — Other Ambulatory Visit: Payer: Self-pay | Admitting: *Deleted

## 2021-07-14 DIAGNOSIS — Z170 Estrogen receptor positive status [ER+]: Secondary | ICD-10-CM

## 2021-07-15 ENCOUNTER — Encounter: Payer: Self-pay | Admitting: *Deleted

## 2021-07-15 ENCOUNTER — Inpatient Hospital Stay: Payer: Managed Care, Other (non HMO) | Attending: Hematology and Oncology

## 2021-07-15 ENCOUNTER — Inpatient Hospital Stay: Payer: Managed Care, Other (non HMO)

## 2021-07-15 VITALS — BP 114/90 | HR 98 | Resp 17

## 2021-07-15 DIAGNOSIS — Z170 Estrogen receptor positive status [ER+]: Secondary | ICD-10-CM

## 2021-07-15 DIAGNOSIS — Z5112 Encounter for antineoplastic immunotherapy: Secondary | ICD-10-CM | POA: Insufficient documentation

## 2021-07-15 DIAGNOSIS — C50411 Malignant neoplasm of upper-outer quadrant of right female breast: Secondary | ICD-10-CM | POA: Insufficient documentation

## 2021-07-15 DIAGNOSIS — Z95828 Presence of other vascular implants and grafts: Secondary | ICD-10-CM

## 2021-07-15 LAB — CMP (CANCER CENTER ONLY)
ALT: 21 U/L (ref 0–44)
AST: 16 U/L (ref 15–41)
Albumin: 4.5 g/dL (ref 3.5–5.0)
Alkaline Phosphatase: 105 U/L (ref 38–126)
Anion gap: 11 (ref 5–15)
BUN: 17 mg/dL (ref 6–20)
CO2: 29 mmol/L (ref 22–32)
Calcium: 10.2 mg/dL (ref 8.9–10.3)
Chloride: 99 mmol/L (ref 98–111)
Creatinine: 1.17 mg/dL — ABNORMAL HIGH (ref 0.44–1.00)
GFR, Estimated: 59 mL/min — ABNORMAL LOW (ref >60)
Glucose, Bld: 115 mg/dL — ABNORMAL HIGH (ref 70–99)
Potassium: 3.8 mmol/L (ref 3.5–5.1)
Sodium: 139 mmol/L (ref 135–145)
Total Bilirubin: 1.1 mg/dL (ref 0.3–1.2)
Total Protein: 8.2 g/dL — ABNORMAL HIGH (ref 6.5–8.1)

## 2021-07-15 LAB — CBC WITH DIFFERENTIAL (CANCER CENTER ONLY)
Abs Immature Granulocytes: 0.03 10*3/uL (ref 0.00–0.07)
Basophils Absolute: 0 10*3/uL (ref 0.0–0.1)
Basophils Relative: 0 %
Eosinophils Absolute: 0 10*3/uL (ref 0.0–0.5)
Eosinophils Relative: 0 %
HCT: 43.3 % (ref 36.0–46.0)
Hemoglobin: 13.7 g/dL (ref 12.0–15.0)
Immature Granulocytes: 0 %
Lymphocytes Relative: 13 %
Lymphs Abs: 1 10*3/uL (ref 0.7–4.0)
MCH: 26.2 pg (ref 26.0–34.0)
MCHC: 31.6 g/dL (ref 30.0–36.0)
MCV: 83 fL (ref 80.0–100.0)
Monocytes Absolute: 0.8 10*3/uL (ref 0.1–1.0)
Monocytes Relative: 10 %
Neutro Abs: 6 10*3/uL (ref 1.7–7.7)
Neutrophils Relative %: 77 %
Platelet Count: 330 10*3/uL (ref 150–400)
RBC: 5.22 MIL/uL — ABNORMAL HIGH (ref 3.87–5.11)
RDW: 15.6 % — ABNORMAL HIGH (ref 11.5–15.5)
WBC Count: 7.9 10*3/uL (ref 4.0–10.5)
nRBC: 0 % (ref 0.0–0.2)

## 2021-07-15 MED ORDER — ACETAMINOPHEN 325 MG PO TABS
650.0000 mg | ORAL_TABLET | Freq: Once | ORAL | Status: AC
  Administered 2021-07-15: 650 mg via ORAL
  Filled 2021-07-15: qty 2

## 2021-07-15 MED ORDER — TRASTUZUMAB-ANNS CHEMO 150 MG IV SOLR
6.0000 mg/kg | Freq: Once | INTRAVENOUS | Status: AC
  Administered 2021-07-15: 399 mg via INTRAVENOUS
  Filled 2021-07-15: qty 19

## 2021-07-15 MED ORDER — SODIUM CHLORIDE 0.9% FLUSH
10.0000 mL | INTRAVENOUS | Status: DC | PRN
  Administered 2021-07-15: 10 mL

## 2021-07-15 MED ORDER — SODIUM CHLORIDE 0.9% FLUSH
10.0000 mL | Freq: Once | INTRAVENOUS | Status: AC
  Administered 2021-07-15: 10 mL

## 2021-07-15 MED ORDER — HEPARIN SOD (PORK) LOCK FLUSH 100 UNIT/ML IV SOLN
500.0000 [IU] | Freq: Once | INTRAVENOUS | Status: AC
  Administered 2021-07-15: 500 [IU]

## 2021-07-15 MED ORDER — HEPARIN SOD (PORK) LOCK FLUSH 100 UNIT/ML IV SOLN
500.0000 [IU] | Freq: Once | INTRAVENOUS | Status: AC | PRN
  Administered 2021-07-15: 500 [IU]

## 2021-07-15 MED ORDER — SODIUM CHLORIDE 0.9 % IV SOLN: Freq: Once | INTRAVENOUS | Status: AC

## 2021-07-15 MED ORDER — DIPHENHYDRAMINE HCL 25 MG PO CAPS
25.0000 mg | ORAL_CAPSULE | Freq: Once | ORAL | Status: AC
  Administered 2021-07-15: 25 mg via ORAL
  Filled 2021-07-15: qty 1

## 2021-07-15 NOTE — Patient Instructions (Signed)
[facility]  Discharge Instructions: ?Thank you for choosing [facility] to provide your oncology and hematology care.  ? ?If you have a lab appointment with the [facility], please go directly to the [facility] and check in at the registration area. ?  ?Wear comfortable clothing and clothing appropriate for easy access to any Portacath or PICC line.  ? ?We strive to give you quality time with your provider. You may need to reschedule your appointment if you arrive late (15 or more minutes).  Arriving late affects you and other patients whose appointments are after yours.  Also, if you miss three or more appointments without notifying the office, you may be dismissed from the clinic at the provider?s discretion.    ?  ?For prescription refill requests, have your pharmacy contact our office and allow 72 hours for refills to be completed.   ? ?Today you received the following chemotherapy and/or immunotherapy agents: Trastuzumab    ?  ?To help prevent nausea and vomiting after your treatment, we encourage you to take your nausea medication as directed. ? ?BELOW ARE SYMPTOMS THAT SHOULD BE REPORTED IMMEDIATELY: ?*FEVER GREATER THAN 100.4 F (38 ?C) OR HIGHER ?*CHILLS OR SWEATING ?*NAUSEA AND VOMITING THAT IS NOT CONTROLLED WITH YOUR NAUSEA MEDICATION ?*UNUSUAL SHORTNESS OF BREATH ?*UNUSUAL BRUISING OR BLEEDING ?*URINARY PROBLEMS (pain or burning when urinating, or frequent urination) ?*BOWEL PROBLEMS (unusual diarrhea, constipation, pain near the anus) ?TENDERNESS IN MOUTH AND THROAT WITH OR WITHOUT PRESENCE OF ULCERS (sore throat, sores in mouth, or a toothache) ?UNUSUAL RASH, SWELLING OR PAIN  ?UNUSUAL VAGINAL DISCHARGE OR ITCHING  ? ?Items with * indicate a potential emergency and should be followed up as soon as possible or go to the Emergency Department if any problems should occur. ? ?Please show the CHEMOTHERAPY ALERT CARD or IMMUNOTHERAPY ALERT CARD at check-in  to the Emergency Department and triage nurse. ? ?Should you have questions after your visit or need to cancel or reschedule your appointment, please contact [facility]  Dept: [phone]  and follow the prompts.  Office hours are 8:00 a.m. to 4:30 p.m. Monday - Friday. Please note that voicemails left after 4:00 p.m. may not be returned until the following business day.  We are closed weekends and major holidays. You have access to a nurse at all times for urgent questions. Please call the main number to the clinic Dept: [phone] and follow the prompts. ? ? ?For any non-urgent questions, you may also contact your provider using MyChart. We now offer e-Visits for anyone 64 and older to request care online for non-urgent symptoms. For details visit [email or web address]. ?  ?Also download the MyChart app! Go to the app store, search "MyChart", open the app, select [facility], and log in with your MyChart username and password. ? ?Due to Covid, a mask is required upon entering the hospital/clinic. If you do not have a mask, one will be given to you upon arrival. For doctor visits, patients may have 1 support person aged 43 or older with them. For treatment visits, patients cannot have anyone with them due to current Covid guidelines and our immunocompromised population.  ? ?

## 2021-07-19 ENCOUNTER — Telehealth: Payer: Self-pay

## 2021-07-19 NOTE — Telephone Encounter (Signed)
Called and spoke with pt, confirmed she has had great PO intake, no significant complaints but will call us if anything changes.  Pt verbalized thanks  ?

## 2021-07-22 ENCOUNTER — Telehealth: Payer: Self-pay | Admitting: Plastic Surgery

## 2021-07-22 MED ORDER — OXYCODONE HCL 5 MG PO TABS: 5.0000 mg | ORAL_TABLET | Freq: Four times a day (QID) | ORAL | 0 refills | Status: AC | PRN

## 2021-07-22 NOTE — Addendum Note (Signed)
Addended by: Krista Blue on: 07/22/2021 12:55 PM ? ? Modules accepted: Orders ? ?

## 2021-07-22 NOTE — Telephone Encounter (Signed)
Pt is calling stating that she was not able to pick up her pain medication hydrocodone when it was called in on 07/07/2021 and would like to see if she can get it called in again before her surgery on Monday.  ?

## 2021-07-22 NOTE — Telephone Encounter (Signed)
Resent

## 2021-07-23 ENCOUNTER — Encounter: Payer: Self-pay | Admitting: Internal Medicine

## 2021-07-24 NOTE — Anesthesia Preprocedure Evaluation (Addendum)
Anesthesia Evaluation  ?Patient identified by MRN, date of birth, ID band ?Patient awake ? ? ? ?Reviewed: ?Allergy & Precautions, NPO status , Patient's Chart, lab work & pertinent test results ? ?Airway ?Mallampati: II ? ?TM Distance: >3 FB ?Neck ROM: Full ? ? ? Dental ?no notable dental hx. ? ?  ?Pulmonary ?former smoker,  ?  ?Pulmonary exam normal ? ? ? ? ? ? ? Cardiovascular ?negative cardio ROS ?Normal cardiovascular exam ? ? ?  ?Neuro/Psych ?negative neurological ROS ? negative psych ROS  ? GI/Hepatic ?Neg liver ROS, Crohn's disease ?  ?Endo/Other  ?negative endocrine ROS ? Renal/GU ?negative Renal ROS  ? ?  ?Musculoskeletal ?negative musculoskeletal ROS ?(+)  ? Abdominal ?  ?Peds ? Hematology ? ?(+) REFUSES BLOOD PRODUCTS,   ?Anesthesia Other Findings ?Malignant Neoplasm of Upper Outer Quadrant of right breast ? Reproductive/Obstetrics ?hcg negative ? ?  ? ? ? ? ? ? ? ? ? ? ? ? ? ?  ?  ? ? ? ? ? ? ?Anesthesia Physical ?Anesthesia Plan ? ?ASA: 2 ? ?Anesthesia Plan: General  ? ?Post-op Pain Management:   ? ?Induction: Intravenous ? ?PONV Risk Score and Plan: 3 and Ondansetron, Dexamethasone, Midazolam and Treatment may vary due to age or medical condition ? ?Airway Management Planned: LMA ? ?Additional Equipment:  ? ?Intra-op Plan:  ? ?Post-operative Plan: Extubation in OR ? ?Informed Consent: I have reviewed the patients History and Physical, chart, labs and discussed the procedure including the risks, benefits and alternatives for the proposed anesthesia with the patient or authorized representative who has indicated his/her understanding and acceptance.  ? ? ? ?Dental advisory given ? ?Plan Discussed with: CRNA ? ?Anesthesia Plan Comments:   ? ? ? ? ?Anesthesia Quick Evaluation ? ?

## 2021-07-25 ENCOUNTER — Other Ambulatory Visit: Payer: Self-pay

## 2021-07-25 ENCOUNTER — Ambulatory Visit (HOSPITAL_BASED_OUTPATIENT_CLINIC_OR_DEPARTMENT_OTHER)
Admission: RE | Admit: 2021-07-25 | Discharge: 2021-07-25 | Disposition: A | Discharge status: Home / Self Care | Payer: Managed Care, Other (non HMO) | Attending: Plastic Surgery | Admitting: Plastic Surgery

## 2021-07-25 ENCOUNTER — Encounter: Payer: Self-pay | Admitting: Plastic Surgery

## 2021-07-25 ENCOUNTER — Encounter (HOSPITAL_BASED_OUTPATIENT_CLINIC_OR_DEPARTMENT_OTHER): Payer: Self-pay | Admitting: Plastic Surgery

## 2021-07-25 ENCOUNTER — Ambulatory Visit (HOSPITAL_BASED_OUTPATIENT_CLINIC_OR_DEPARTMENT_OTHER): Payer: Managed Care, Other (non HMO) | Admitting: Anesthesiology

## 2021-07-25 ENCOUNTER — Encounter (HOSPITAL_BASED_OUTPATIENT_CLINIC_OR_DEPARTMENT_OTHER)
Admission: RE | Disposition: A | Discharge status: Home / Self Care | Payer: Self-pay | Source: Home / Self Care | Attending: Plastic Surgery

## 2021-07-25 DIAGNOSIS — Z421 Encounter for breast reconstruction following mastectomy: Secondary | ICD-10-CM

## 2021-07-25 DIAGNOSIS — Z980 Intestinal bypass and anastomosis status: Secondary | ICD-10-CM | POA: Insufficient documentation

## 2021-07-25 DIAGNOSIS — Z170 Estrogen receptor positive status [ER+]: Secondary | ICD-10-CM | POA: Insufficient documentation

## 2021-07-25 DIAGNOSIS — K5090 Crohn's disease, unspecified, without complications: Secondary | ICD-10-CM | POA: Insufficient documentation

## 2021-07-25 DIAGNOSIS — Z9049 Acquired absence of other specified parts of digestive tract: Secondary | ICD-10-CM | POA: Diagnosis not present

## 2021-07-25 DIAGNOSIS — Z853 Personal history of malignant neoplasm of breast: Secondary | ICD-10-CM

## 2021-07-25 DIAGNOSIS — Z87891 Personal history of nicotine dependence: Secondary | ICD-10-CM | POA: Insufficient documentation

## 2021-07-25 DIAGNOSIS — C50411 Malignant neoplasm of upper-outer quadrant of right female breast: Secondary | ICD-10-CM

## 2021-07-25 HISTORY — PX: PORT-A-CATH REMOVAL: SHX5289

## 2021-07-25 HISTORY — PX: CAPSULOTOMY: SHX379

## 2021-07-25 HISTORY — PX: REMOVAL OF BILATERAL TISSUE EXPANDERS WITH PLACEMENT OF BILATERAL BREAST IMPLANTS: SHX6431

## 2021-07-25 LAB — POCT PREGNANCY, URINE: Preg Test, Ur: NEGATIVE

## 2021-07-25 SURGERY — REMOVAL, TISSUE EXPANDER, BREAST, BILATERAL, WITH BILATERAL IMPLANT IMPLANT INSERTION
Anesthesia: General | Site: Chest

## 2021-07-25 MED ORDER — DEXAMETHASONE SODIUM PHOSPHATE 4 MG/ML IJ SOLN
INTRAMUSCULAR | Status: DC | PRN
Start: 2021-07-25 — End: 2021-07-25
  Administered 2021-07-25: 4 mg via INTRAVENOUS

## 2021-07-25 MED ORDER — FENTANYL CITRATE (PF) 100 MCG/2ML IJ SOLN
INTRAMUSCULAR | Status: AC
Start: 2021-07-25 — End: ?
  Filled 2021-07-25: qty 2

## 2021-07-25 MED ORDER — ACETAMINOPHEN 500 MG PO TABS
ORAL_TABLET | ORAL | Status: AC
  Filled 2021-07-25: qty 2

## 2021-07-25 MED ORDER — CEFAZOLIN SODIUM-DEXTROSE 2-4 GM/100ML-% IV SOLN
INTRAVENOUS | Status: AC
  Filled 2021-07-25: qty 100

## 2021-07-25 MED ORDER — 0.9 % SODIUM CHLORIDE (POUR BTL) OPTIME
TOPICAL | Status: DC | PRN
  Administered 2021-07-25: 500 mL

## 2021-07-25 MED ORDER — CEFAZOLIN SODIUM-DEXTROSE 2-4 GM/100ML-% IV SOLN
2.0000 g | INTRAVENOUS | Status: AC
  Administered 2021-07-25: 2 g via INTRAVENOUS

## 2021-07-25 MED ORDER — ACETAMINOPHEN 500 MG PO TABS
1000.0000 mg | ORAL_TABLET | Freq: Once | ORAL | Status: AC
Start: 2021-07-25 — End: 2021-07-25
  Administered 2021-07-25: 1000 mg via ORAL

## 2021-07-25 MED ORDER — BUPIVACAINE-EPINEPHRINE (PF) 0.25% -1:200000 IJ SOLN
INTRAMUSCULAR | Status: AC
  Filled 2021-07-25: qty 30

## 2021-07-25 MED ORDER — MIDAZOLAM HCL 2 MG/2ML IJ SOLN
INTRAMUSCULAR | Status: AC
Start: 2021-07-25 — End: ?
  Filled 2021-07-25: qty 2

## 2021-07-25 MED ORDER — OXYCODONE HCL 5 MG PO TABS: 5.0000 mg | ORAL_TABLET | Freq: Once | ORAL | Status: DC | PRN

## 2021-07-25 MED ORDER — FENTANYL CITRATE (PF) 100 MCG/2ML IJ SOLN
INTRAMUSCULAR | Status: DC | PRN
  Administered 2021-07-25: 25 ug via INTRAVENOUS
  Administered 2021-07-25: 50 ug via INTRAVENOUS
  Administered 2021-07-25: 25 ug via INTRAVENOUS

## 2021-07-25 MED ORDER — FENTANYL CITRATE (PF) 100 MCG/2ML IJ SOLN
25.0000 ug | INTRAMUSCULAR | Status: DC | PRN
  Administered 2021-07-25: 25 ug via INTRAVENOUS
  Administered 2021-07-25: 50 ug via INTRAVENOUS

## 2021-07-25 MED ORDER — LACTATED RINGERS IV SOLN: INTRAVENOUS | Status: DC

## 2021-07-25 MED ORDER — BUPIVACAINE-EPINEPHRINE 0.25% -1:200000 IJ SOLN
INTRAMUSCULAR | Status: DC | PRN
Start: 2021-07-25 — End: 2021-07-25
  Administered 2021-07-25: 30 mL

## 2021-07-25 MED ORDER — OXYCODONE HCL 5 MG/5ML PO SOLN: 5.0000 mg | Freq: Once | ORAL | Status: DC | PRN

## 2021-07-25 MED ORDER — MIDAZOLAM HCL 5 MG/5ML IJ SOLN
INTRAMUSCULAR | Status: DC | PRN
  Administered 2021-07-25: 2 mg via INTRAVENOUS

## 2021-07-25 MED ORDER — FENTANYL CITRATE (PF) 100 MCG/2ML IJ SOLN
INTRAMUSCULAR | Status: AC
  Filled 2021-07-25: qty 2

## 2021-07-25 MED ORDER — LIDOCAINE 2% (20 MG/ML) 5 ML SYRINGE
INTRAMUSCULAR | Status: DC | PRN
  Administered 2021-07-25: 60 mg via INTRAVENOUS

## 2021-07-25 MED ORDER — PROPOFOL 10 MG/ML IV BOLUS
INTRAVENOUS | Status: DC | PRN
  Administered 2021-07-25: 150 mg via INTRAVENOUS

## 2021-07-25 MED ORDER — AMISULPRIDE (ANTIEMETIC) 5 MG/2ML IV SOLN: 10.0000 mg | Freq: Once | INTRAVENOUS | Status: DC | PRN

## 2021-07-25 SURGICAL SUPPLY — 60 items
APL PRP STRL LF DISP 70% ISPRP (MISCELLANEOUS) ×4
BAG DECANTER FOR FLEXI CONT (MISCELLANEOUS) ×3 IMPLANT
BLADE SURG 10 STRL SS (BLADE) IMPLANT
BLADE SURG 15 STRL LF DISP TIS (BLADE) ×2 IMPLANT
BLADE SURG 15 STRL SS (BLADE) ×3
BNDG CMPR MED 10X6 ELC LF (GAUZE/BANDAGES/DRESSINGS) ×2
BNDG ELASTIC 6X10 VLCR STRL LF (GAUZE/BANDAGES/DRESSINGS) ×3 IMPLANT
CANISTER SUCT 1200ML W/VALVE (MISCELLANEOUS) ×3 IMPLANT
CHLORAPREP W/TINT 26 (MISCELLANEOUS) ×6 IMPLANT
COVER BACK TABLE 60X90IN (DRAPES) ×3 IMPLANT
COVER MAYO STAND STRL (DRAPES) ×3 IMPLANT
DRAIN CHANNEL 15F RND FF W/TCR (WOUND CARE) IMPLANT
DRAPE LAPAROSCOPIC ABDOMINAL (DRAPES) ×3 IMPLANT
DRAPE UTILITY XL STRL (DRAPES) ×3 IMPLANT
DRSG PAD ABDOMINAL 8X10 ST (GAUZE/BANDAGES/DRESSINGS) ×6 IMPLANT
ELECT BLADE 4.0 EZ CLEAN MEGAD (MISCELLANEOUS)
ELECT COATED BLADE 2.86 ST (ELECTRODE) ×3 IMPLANT
ELECT REM PT RETURN 9FT ADLT (ELECTROSURGICAL) ×3
ELECTRODE BLDE 4.0 EZ CLN MEGD (MISCELLANEOUS) IMPLANT
ELECTRODE REM PT RTRN 9FT ADLT (ELECTROSURGICAL) ×2 IMPLANT
EVACUATOR SILICONE 100CC (DRAIN) IMPLANT
FUNNEL KELLER 2 DISP (MISCELLANEOUS) ×3 IMPLANT
GAUZE SPONGE 4X4 12PLY STRL (GAUZE/BANDAGES/DRESSINGS) ×3 IMPLANT
GLOVE BIOGEL PI IND STRL 8 (GLOVE) ×2 IMPLANT
GLOVE BIOGEL PI INDICATOR 8 (GLOVE) ×1
GLOVE SURG ENC TEXT LTX SZ7.5 (GLOVE) ×6 IMPLANT
GOWN STRL REUS W/ TWL LRG LVL3 (GOWN DISPOSABLE) ×4 IMPLANT
GOWN STRL REUS W/TWL LRG LVL3 (GOWN DISPOSABLE) ×6
GOWN STRL REUS W/TWL XL LVL3 (GOWN DISPOSABLE) ×3 IMPLANT
IMPL BREAST P5.9XHI RND 450 (Breast) IMPLANT
IMPL BRST P5.9XHI RND 450CC (Breast) ×4 IMPLANT
IMPLANT BREAST GEL 450CC (Breast) ×6 IMPLANT
IV NS 500ML (IV SOLUTION)
IV NS 500ML BAXH (IV SOLUTION) IMPLANT
MARKER SKIN DUAL TIP RULER LAB (MISCELLANEOUS) IMPLANT
NDL HYPO 25X1 1.5 SAFETY (NEEDLE) ×2 IMPLANT
NEEDLE HYPO 25X1 1.5 SAFETY (NEEDLE) ×3 IMPLANT
PACK BASIN DAY SURGERY FS (CUSTOM PROCEDURE TRAY) ×3 IMPLANT
PENCIL SMOKE EVACUATOR (MISCELLANEOUS) ×3 IMPLANT
PIN SAFETY STERILE (MISCELLANEOUS) IMPLANT
SIZER BREAST REUSE 415CC (SIZER) ×3
SIZER BREAST REUSE 450CC (SIZER) ×6
SIZER BRST REUSE 450CC (SIZER) IMPLANT
SIZER BRST REUSE P5.8XHI 415CC (SIZER) IMPLANT
SLEEVE SCD COMPRESS KNEE MED (STOCKING) ×3 IMPLANT
SPIKE FLUID TRANSFER (MISCELLANEOUS) IMPLANT
SPONGE T-LAP 18X18 ~~LOC~~+RFID (SPONGE) ×3 IMPLANT
STAPLER VISISTAT 35W (STAPLE) ×3 IMPLANT
STRIP SUTURE WOUND CLOSURE 1/2 (MISCELLANEOUS) ×6 IMPLANT
SUT ETHILON 2 0 FS 18 (SUTURE) IMPLANT
SUT PDS 3-0 CT2 (SUTURE) ×6
SUT PDS II 3-0 CT2 27 ABS (SUTURE) ×4 IMPLANT
SUT VLOC 180 0 24IN GS25 (SUTURE) IMPLANT
SUT VLOC 90 P-14 23 (SUTURE) ×3 IMPLANT
SYR BULB IRRIG 60ML STRL (SYRINGE) ×3 IMPLANT
SYR CONTROL 10ML LL (SYRINGE) ×3 IMPLANT
TOWEL GREEN STERILE FF (TOWEL DISPOSABLE) ×3 IMPLANT
TUBE CONNECTING 20X1/4 (TUBING) ×3 IMPLANT
UNDERPAD 30X36 HEAVY ABSORB (UNDERPADS AND DIAPERS) ×6 IMPLANT
YANKAUER SUCT BULB TIP NO VENT (SUCTIONS) ×3 IMPLANT

## 2021-07-25 NOTE — Transfer of Care (Signed)
Immediate Anesthesia Transfer of Care Note ? ?Patient: TITYANA PAGAN ? ?Procedure(s) Performed: REMOVAL OF BILATERAL TISSUE EXPANDERS WITH PLACEMENT OF BILATERAL BREAST IMPLANTS (Bilateral: Breast) ?CAPSULOTOMY (Bilateral: Breast) ?REMOVAL PORT-A-CATH (Chest) ? ?Patient Location: PACU ? ?Anesthesia Type:General ? ?Level of Consciousness: drowsy and patient cooperative ? ?Airway & Oxygen Therapy: Patient Spontanous Breathing and Patient connected to face mask oxygen ? ?Post-op Assessment: Report given to RN and Post -op Vital signs reviewed and stable ? ?Post vital signs: Reviewed and stable ? ?Last Vitals:  ?Vitals Value Taken Time  ?BP    ?Temp    ?Pulse    ?Resp    ?SpO2    ? ? ?Last Pain:  ?Vitals:  ? 07/25/21 0645  ?TempSrc: Oral  ?PainSc: 0-No pain  ?   ? ?Patients Stated Pain Goal: 3 (07/25/21 0645) ? ?Complications: No notable events documented. ?

## 2021-07-25 NOTE — Anesthesia Postprocedure Evaluation (Signed)
Anesthesia Post Note ? ?Patient: MONA AYARS ? ?Procedure(s) Performed: REMOVAL OF BILATERAL TISSUE EXPANDERS WITH PLACEMENT OF BILATERAL BREAST IMPLANTS (Bilateral: Breast) ?CAPSULOTOMY (Bilateral: Breast) ?REMOVAL PORT-A-CATH (Chest) ? ?  ? ?Patient location during evaluation: PACU ?Anesthesia Type: General ?Level of consciousness: awake ?Pain management: pain level controlled ?Vital Signs Assessment: post-procedure vital signs reviewed and stable ?Respiratory status: spontaneous breathing, nonlabored ventilation, respiratory function stable and patient connected to nasal cannula oxygen ?Cardiovascular status: blood pressure returned to baseline and stable ?Postop Assessment: no apparent nausea or vomiting ?Anesthetic complications: no ? ? ?No notable events documented. ? ?Last Vitals:  ?Vitals:  ? 07/25/21 0945 07/25/21 1007  ?BP: (!) 146/96 (!) 144/100  ?Pulse: 79 90  ?Resp: 10 16  ?Temp:  36.8 ?C  ?SpO2: 100% 97%  ?  ?Last Pain:  ?Vitals:  ? 07/25/21 1007  ?TempSrc: Tympanic  ?PainSc: 3   ? ? ?  ?  ?  ?  ?  ?  ? ?Elonna Mcfarlane P Knolan Simien ? ? ? ? ?

## 2021-07-25 NOTE — Op Note (Signed)
Operative Note  ? ?DATE OF OPERATION: 07/25/2021 ? ?SURGICAL DEPARTMENT: Plastic Surgery ? ?PREOPERATIVE DIAGNOSES: 1.  History of breast cancer undergoing breast reconstruction ?2.  Left chest Port-A-Cath ? ?POSTOPERATIVE DIAGNOSES:  same ? ?PROCEDURE: 1.  Exchange of bilateral breast tissue expanders for gel implants ?2.  Left superior capsulotomy ?3.  Right inferior capsulotomy ?4.  Excision of excess left breast skin to assist with nipple areolar complex symmetry ?5.  Removal of left chest Port-A-Cath ? ?SURGEON: Talmadge Coventry, MD ? ?ASSISTANT: Krista Blue, PA ?The advanced practice practitioner (APP) assisted throughout the case.  The APP was essential in retraction and counter traction when needed to make the case progress smoothly.  This retraction and assistance made it possible to see the tissue planes for the procedure.  The assistance was needed for hemostasis, tissue re-approximation and closure of the incision site.  ? ?ANESTHESIA:  General.  ? ?COMPLICATIONS: None.  ? ?INDICATIONS FOR PROCEDURE:  ?The patient, [name] is a 46 y.o. female born on Sep 05, 1975, is here for treatment of breast reconstruction ?MRN: [id number] ? ?CONSENT:  ?Informed consent was obtained directly from the patient. Risks, benefits and alternatives were fully discussed. Specific risks including but not limited to bleeding, infection, hematoma, seroma, scarring, pain, contracture, asymmetry, wound healing problems, and need for further surgery were all discussed. The patient did have an ample opportunity to have questions answered to satisfaction.  ? ?DESCRIPTION OF PROCEDURE:  ?The patient was taken to the operating room. SCDs were placed and antibiotics were given.  General anesthesia was administered.  The patient's operative site was prepped and draped in a sterile fashion. A time out was performed and all information was confirmed to be correct.  Started by infiltrating Marcaine with epinephrine in the planned  incision sites.  I started in the left chest removing the Port-A-Cath.  Incision was made with a 15 blade.  I dissected down to the port with cautery.  Sutures holding in place were removed.  The port was then able to be delivered atraumatically from the wound and the catheter and the port were able to be removed in their entirety.  Hemostasis was ensured.  The wound was closed with interrupted buried 4 Monocryl sutures. ? ?Then turned my attention to the left chest.  An ellipse of skin was excised along the inframammary incision to help bring the nipple areolar complex further inferiorly to match the right side.  This was done with a 15 blade and the skin was sent as specimen.  The tissue expander was removed without issue.  A superior capsulotomy was performed with cautery to assist with symmetry.  A form 50 cc sizer was placed and the skin was stapled closed.  I then turned my attention to the right side.  More limited inferior incision was made with a 15 blade.  Dissected down to the expander with cautery.  Fluid was evacuated and the expander was removed without issue.  An inferior capsulotomy was then performed to assist with symmetry.  Hemostasis was obtained and a 450 cc sizer was placed.  Skin was stapled closed.  Patient was then set up to check for appropriate shape size and symmetry minor modifications were made.  She was then sat back down in the sizers were removed.  Both pockets were irrigated with Betadine irrigation.  Implants were then brought onto the field.  I elected to use a Mentor high-profile extra implants with 450 cc of volume.  Serial number for the left  side [id number].  Serial number for the right side [id number].  Implants were then placed with a Keller funnel.  Pocket was closed with running 3-0 PDS on each side.  Both incisions were then closed with buried INSORB staples and running 3 OV lock.  This gave a nice on table result.  Steri-Strips were applied to the wounds followed by  soft compressive dressing. ? ?The patient tolerated the procedure well.  There were no complications. The patient was allowed to wake from anesthesia, extubated and taken to the recovery room in satisfactory condition.  ? ?

## 2021-07-25 NOTE — Discharge Instructions (Addendum)
Activity: Avoid strenuous activity.  No heavy lifting. ? ?Diet: No restrictions.  Try to optimize nutrition with plenty of fruits and vegetables to improve healing. ? ?Wound Care: Leave breast binder for 3 days.  You may then remove and shower normally.  After breast binder comes off, recommend sports bra for gentle compression.  Leave Steri-Strips in place.  Your port was removed.  Avoid any bra with under-wire until cleared by Dr. Claudia Desanctis. ? ?Follow-Up: Scheduled for next week. ? ?Things to watch for:  Call the office if you experience fever, chills, persistent nausea, or significant bleeding.  Mild wound drainage is common after implant placement and should not be cause for alarm. ? ? ?May take Tylenol after 1:30pm, if needed.  ? ? ?Post Anesthesia Home Care Instructions ? ?Activity: ?Get plenty of rest for the remainder of the day. A responsible individual must stay with you for 24 hours following the procedure.  ?For the next 24 hours, DO NOT: ?-Drive a car ?-Paediatric nurse ?-Drink alcoholic beverages ?-Take any medication unless instructed by your physician ?-Make any legal decisions or sign important papers. ? ?Meals: ?Start with liquid foods such as gelatin or soup. Progress to regular foods as tolerated. Avoid greasy, spicy, heavy foods. If nausea and/or vomiting occur, drink only clear liquids until the nausea and/or vomiting subsides. Call your physician if vomiting continues. ? ?Special Instructions/Symptoms: ?Your throat may feel dry or sore from the anesthesia or the breathing tube placed in your throat during surgery. If this causes discomfort, gargle with warm salt water. The discomfort should disappear within 24 hours. ? ?If you had a scopolamine patch placed behind your ear for the management of post- operative nausea and/or vomiting: ? ?1. The medication in the patch is effective for 72 hours, after which it should be removed.  Wrap patch in a tissue and discard in the trash. Wash hands thoroughly  with soap and water. ?2. You may remove the patch earlier than 72 hours if you experience unpleasant side effects which may include dry mouth, dizziness or visual disturbances. ?3. Avoid touching the patch. Wash your hands with soap and water after contact with the patch. ?    ?

## 2021-07-25 NOTE — Interval H&P Note (Signed)
History and Physical Interval Note: ? ?07/25/2021 ?7:34 AM ? ?[name]  has presented today for surgery, with the diagnosis of Malignant Neoplasm of Upper Outer Quadrant of right breast.  The various methods of treatment have been discussed with the patient and family. After consideration of risks, benefits and other options for treatment, the patient has consented to  Procedure(s) with comments: ?REMOVAL OF BILATERAL TISSUE EXPANDERS WITH PLACEMENT OF BILATERAL BREAST IMPLANTS (Bilateral) - 1.5 ?POSSIBLE CAPSULOTOMY (Bilateral) ?REMOVAL PORT-A-CATH (N/A) as a surgical intervention.  The patient's history has been reviewed, patient examined, no change in status, stable for surgery.  I have reviewed the patient's chart and labs.  Questions were answered to the patient's satisfaction.   ? ? ?[name] ? ? ?

## 2021-07-25 NOTE — Anesthesia Procedure Notes (Signed)
Procedure Name: LMA Insertion ?Date/Time: 07/25/2021 7:42 AM ?Performed by: Signe Colt, CRNA ?Pre-anesthesia Checklist: Patient identified, Emergency Drugs available, Suction available and Patient being monitored ?Patient Re-evaluated:Patient Re-evaluated prior to induction ?Oxygen Delivery Method: Circle System Utilized ?Preoxygenation: Pre-oxygenation with 100% oxygen ?Induction Type: IV induction ?Ventilation: Mask ventilation without difficulty ?LMA: LMA inserted ?LMA Size: 4.0 ?Number of attempts: 1 ?Airway Equipment and Method: bite block ?Placement Confirmation: positive ETCO2 ?Tube secured with: Tape ?Dental Injury: Teeth and Oropharynx as per pre-operative assessment  ? ? ? ? ?

## 2021-07-26 ENCOUNTER — Encounter (HOSPITAL_BASED_OUTPATIENT_CLINIC_OR_DEPARTMENT_OTHER): Payer: Self-pay | Admitting: Plastic Surgery

## 2021-07-26 LAB — SURGICAL PATHOLOGY

## 2021-08-03 ENCOUNTER — Ambulatory Visit (INDEPENDENT_AMBULATORY_CARE_PROVIDER_SITE_OTHER): Payer: Managed Care, Other (non HMO) | Admitting: Plastic Surgery

## 2021-08-03 ENCOUNTER — Encounter: Payer: Self-pay | Admitting: Plastic Surgery

## 2021-08-03 DIAGNOSIS — Z170 Estrogen receptor positive status [ER+]: Secondary | ICD-10-CM

## 2021-08-03 DIAGNOSIS — C50411 Malignant neoplasm of upper-outer quadrant of right female breast: Secondary | ICD-10-CM

## 2021-08-03 NOTE — Progress Notes (Signed)
Patient presents postop from exchange of tissue expanders for implants and port removal.  She feels like everything is going well.  She does note an area of perceived color difference lateral to the nipple on the left breast.  On examination she looks to be healing well.  Her areola symmetry is improved from preop.  Incisions are intact.  Overall results is reasonably symmetric.  The hypopigmentation lateral to the left nipple looks like a pre-existing scar from her prior operations.  No surgery was done in that specific spot this time around.  Port site looks to be healing well also.  We will plan to continue supportive bras and avoid strenuous activity and see her again in 3 weeks.  All of her questions were answered. ?

## 2021-08-10 ENCOUNTER — Encounter: Payer: Self-pay | Admitting: Internal Medicine

## 2021-08-24 ENCOUNTER — Ambulatory Visit (INDEPENDENT_AMBULATORY_CARE_PROVIDER_SITE_OTHER): Payer: Managed Care, Other (non HMO) | Admitting: Surgical

## 2021-08-24 DIAGNOSIS — C50411 Malignant neoplasm of upper-outer quadrant of right female breast: Secondary | ICD-10-CM

## 2021-08-24 DIAGNOSIS — Z170 Estrogen receptor positive status [ER+]: Secondary | ICD-10-CM

## 2021-08-24 NOTE — Progress Notes (Signed)
Patient is a 46 year old female here for follow-up after removal of bilateral breast tissue expanders and placement of bilateral breast implants, bilateral capsulotomy and removal of Port-A-Cath with Dr. Claudia Desanctis on 07/25/2021.  She is 1 month postop.  Of note, she does have radiation history to the right breast. ? ?She has some questions about right breast tightness and skin changes due to radiation.  She reports otherwise she is doing well. ? ?Chaperone present on exam ?On exam bilateral NAC's are viable, bilateral breasts are overall reasonably symmetric with the right being slightly higher due to the tighten skin from radiation.  Port site is healing well. ? ?We discussed use of scar creams to incisions and lotion to right breast for dry skin.  We discussed that implants will continue to settle over the next few months, symmetry may improve, but with a history of radiation the left breast will always likely be slightly more ptotic.  Continue with compressive garments 24/7 for 2 more weeks, then can transition to daily compression only.  We discussed follow-up in 3 months for reevaluation.  Call with questions or concerns. ? ?Pictures were obtained of the patient and placed in the chart with the patient's or guardian's permission. ? ?

## 2021-08-30 ENCOUNTER — Encounter: Payer: Self-pay | Admitting: Hematology and Oncology

## 2021-09-05 ENCOUNTER — Emergency Department (HOSPITAL_COMMUNITY): Payer: Managed Care, Other (non HMO)

## 2021-09-05 ENCOUNTER — Encounter (HOSPITAL_COMMUNITY): Payer: Self-pay

## 2021-09-05 ENCOUNTER — Other Ambulatory Visit: Payer: Self-pay

## 2021-09-05 ENCOUNTER — Inpatient Hospital Stay (HOSPITAL_COMMUNITY)
Admission: EM | Admit: 2021-09-05 | Discharge: 2021-09-08 | DRG: 387 | Disposition: A | Discharge status: Home / Self Care | Payer: Managed Care, Other (non HMO) | Attending: Internal Medicine | Admitting: Internal Medicine

## 2021-09-05 DIAGNOSIS — K56609 Unspecified intestinal obstruction, unspecified as to partial versus complete obstruction: Secondary | ICD-10-CM

## 2021-09-05 DIAGNOSIS — K50819 Crohn's disease of both small and large intestine with unspecified complications: Secondary | ICD-10-CM | POA: Diagnosis not present

## 2021-09-05 DIAGNOSIS — Z803 Family history of malignant neoplasm of breast: Secondary | ICD-10-CM

## 2021-09-05 DIAGNOSIS — Z9221 Personal history of antineoplastic chemotherapy: Secondary | ICD-10-CM

## 2021-09-05 DIAGNOSIS — Z853 Personal history of malignant neoplasm of breast: Secondary | ICD-10-CM

## 2021-09-05 DIAGNOSIS — R000 Tachycardia, unspecified: Secondary | ICD-10-CM | POA: Diagnosis not present

## 2021-09-05 DIAGNOSIS — Z923 Personal history of irradiation: Secondary | ICD-10-CM

## 2021-09-05 DIAGNOSIS — Z79899 Other long term (current) drug therapy: Secondary | ICD-10-CM

## 2021-09-05 DIAGNOSIS — C50411 Malignant neoplasm of upper-outer quadrant of right female breast: Secondary | ICD-10-CM | POA: Diagnosis not present

## 2021-09-05 DIAGNOSIS — Z87891 Personal history of nicotine dependence: Secondary | ICD-10-CM

## 2021-09-05 DIAGNOSIS — K50812 Crohn's disease of both small and large intestine with intestinal obstruction: Principal | ICD-10-CM | POA: Diagnosis present

## 2021-09-05 DIAGNOSIS — E559 Vitamin D deficiency, unspecified: Secondary | ICD-10-CM | POA: Diagnosis present

## 2021-09-05 DIAGNOSIS — D509 Iron deficiency anemia, unspecified: Secondary | ICD-10-CM | POA: Diagnosis present

## 2021-09-05 DIAGNOSIS — Z9013 Acquired absence of bilateral breasts and nipples: Secondary | ICD-10-CM

## 2021-09-05 DIAGNOSIS — Z9049 Acquired absence of other specified parts of digestive tract: Secondary | ICD-10-CM

## 2021-09-05 DIAGNOSIS — Z170 Estrogen receptor positive status [ER+]: Secondary | ICD-10-CM

## 2021-09-05 DIAGNOSIS — D72819 Decreased white blood cell count, unspecified: Secondary | ICD-10-CM | POA: Diagnosis present

## 2021-09-05 DIAGNOSIS — Z888 Allergy status to other drugs, medicaments and biological substances status: Secondary | ICD-10-CM

## 2021-09-05 LAB — COMPREHENSIVE METABOLIC PANEL
ALT: 20 U/L (ref 0–44)
AST: 23 U/L (ref 15–41)
Albumin: 4.3 g/dL (ref 3.5–5.0)
Alkaline Phosphatase: 113 U/L (ref 38–126)
Anion gap: 14 (ref 5–15)
BUN: 22 mg/dL — ABNORMAL HIGH (ref 6–20)
CO2: 24 mmol/L (ref 22–32)
Calcium: 9.6 mg/dL (ref 8.9–10.3)
Chloride: 100 mmol/L (ref 98–111)
Creatinine, Ser: 0.96 mg/dL (ref 0.44–1.00)
GFR, Estimated: >60 mL/min (ref >60)
Glucose, Bld: 122 mg/dL — ABNORMAL HIGH (ref 70–99)
Potassium: 3.6 mmol/L (ref 3.5–5.1)
Sodium: 138 mmol/L (ref 135–145)
Total Bilirubin: 2 mg/dL — ABNORMAL HIGH (ref 0.3–1.2)
Total Protein: 8.7 g/dL — ABNORMAL HIGH (ref 6.5–8.1)

## 2021-09-05 LAB — CBC WITH DIFFERENTIAL/PLATELET
Abs Immature Granulocytes: 0.02 10*3/uL (ref 0.00–0.07)
Basophils Absolute: 0 10*3/uL (ref 0.0–0.1)
Basophils Relative: 0 %
Eosinophils Absolute: 0 10*3/uL (ref 0.0–0.5)
Eosinophils Relative: 0 %
HCT: 42.2 % (ref 36.0–46.0)
Hemoglobin: 14.1 g/dL (ref 12.0–15.0)
Immature Granulocytes: 0 %
Lymphocytes Relative: 16 %
Lymphs Abs: 1.1 10*3/uL (ref 0.7–4.0)
MCH: 27.8 pg (ref 26.0–34.0)
MCHC: 33.4 g/dL (ref 30.0–36.0)
MCV: 83.2 fL (ref 80.0–100.0)
Monocytes Absolute: 0.7 10*3/uL (ref 0.1–1.0)
Monocytes Relative: 10 %
Neutro Abs: 4.9 10*3/uL (ref 1.7–7.7)
Neutrophils Relative %: 74 %
Platelets: 334 10*3/uL (ref 150–400)
RBC: 5.07 MIL/uL (ref 3.87–5.11)
RDW: 14.7 % (ref 11.5–15.5)
WBC: 6.6 10*3/uL (ref 4.0–10.5)
nRBC: 0 % (ref 0.0–0.2)

## 2021-09-05 LAB — LACTIC ACID, PLASMA
Lactic Acid, Venous: 1.3 mmol/L (ref 0.5–1.9)
Lactic Acid, Venous: 1.7 mmol/L (ref 0.5–1.9)

## 2021-09-05 LAB — LIPASE, BLOOD: Lipase: 26 U/L (ref 11–51)

## 2021-09-05 MED ORDER — ACETAMINOPHEN 325 MG PO TABS
650.0000 mg | ORAL_TABLET | Freq: Four times a day (QID) | ORAL | Status: DC | PRN
  Administered 2021-09-05: 650 mg via ORAL
  Filled 2021-09-05: qty 2

## 2021-09-05 MED ORDER — MESALAMINE 1.2 G PO TBEC
2.4000 g | DELAYED_RELEASE_TABLET | Freq: Two times a day (BID) | ORAL | Status: DC
  Administered 2021-09-06 – 2021-09-08 (×4): 2.4 g via ORAL
  Filled 2021-09-05 (×6): qty 2

## 2021-09-05 MED ORDER — SODIUM CHLORIDE 0.9 % IV SOLN: INTRAVENOUS | Status: DC

## 2021-09-05 MED ORDER — IOHEXOL 300 MG/ML  SOLN
100.0000 mL | Freq: Once | INTRAMUSCULAR | Status: AC | PRN
  Administered 2021-09-05: 100 mL via INTRAVENOUS

## 2021-09-05 MED ORDER — ACETAMINOPHEN 650 MG RE SUPP: 650.0000 mg | Freq: Four times a day (QID) | RECTAL | Status: DC | PRN

## 2021-09-05 MED ORDER — SODIUM CHLORIDE 0.9 % IV BOLUS
500.0000 mL | Freq: Once | INTRAVENOUS | Status: AC
  Administered 2021-09-05: 500 mL via INTRAVENOUS

## 2021-09-05 MED ORDER — POTASSIUM CHLORIDE CRYS ER 10 MEQ PO TBCR
10.0000 meq | EXTENDED_RELEASE_TABLET | Freq: Every day | ORAL | Status: DC
  Administered 2021-09-06 – 2021-09-08 (×2): 10 meq via ORAL
  Filled 2021-09-05 (×3): qty 1

## 2021-09-05 MED ORDER — ENOXAPARIN SODIUM 40 MG/0.4ML IJ SOSY
40.0000 mg | PREFILLED_SYRINGE | INTRAMUSCULAR | Status: DC
Start: 2021-09-06 — End: 2021-09-08
  Filled 2021-09-05 (×2): qty 0.4

## 2021-09-05 MED ORDER — ONDANSETRON HCL 4 MG/2ML IJ SOLN: 4.0000 mg | Freq: Four times a day (QID) | INTRAMUSCULAR | Status: DC | PRN

## 2021-09-05 MED ORDER — MORPHINE SULFATE (PF) 2 MG/ML IV SOLN
2.0000 mg | INTRAVENOUS | Status: DC | PRN
  Administered 2021-09-06 (×2): 2 mg via INTRAVENOUS
  Filled 2021-09-05 (×2): qty 1

## 2021-09-05 MED ORDER — SODIUM CHLORIDE 0.9 % IV BOLUS
1000.0000 mL | Freq: Once | INTRAVENOUS | Status: AC
  Administered 2021-09-05: 1000 mL via INTRAVENOUS

## 2021-09-05 MED ORDER — ONDANSETRON HCL 4 MG PO TABS: 4.0000 mg | ORAL_TABLET | Freq: Four times a day (QID) | ORAL | Status: DC | PRN

## 2021-09-05 NOTE — ED Provider Triage Note (Signed)
Emergency Medicine Provider Triage Evaluation Note  [name] , a 46 y.o. female  was evaluated in triage.  Pt complains of symptoms that remind her of previous small bowel obstructions.  Patient has a history of breast cancer and is about a month status post treatment for this.  She developed abdominal pain with vomiting in the early morning hours today.  She has had generalized abdominal pain.  She is not passing gas or having bowel movements.  No chest pain or shortness of breath but she is tachycardic on arrival to 151.  Review of Systems  Positive: Abdominal pain, vomiting Negative: Fever  Physical Exam  BP (!) 138/96 (BP Location: Left Arm)   Pulse (!) 151   Temp 98.5 F (36.9 C) (Oral)   Resp 16   SpO2 100%  Gen:   Awake, no distress   Resp:  Normal effort  MSK:   Moves extremities without difficulty  Other:  Tachycardic 140-150  Medical Decision Making  Medically screening exam initiated at 6:44 PM.  Appropriate orders placed.  [name] was informed that the remainder of the evaluation will be completed by another provider, this initial triage assessment does not replace that evaluation, and the importance of remaining in the ED until their evaluation is complete.     Carlisle Cater, PA-C 09/05/21 1845

## 2021-09-05 NOTE — ED Provider Notes (Signed)
[facility] DEPT Provider Note   CSN: [id number] Arrival date & time: 09/05/21  [place]     History  Chief Complaint  Patient presents with   Abdominal Pain   Emesis    [name] is a 46 y.o. female.  Patient with history of breast cancer, Crohn's disease history of colectomy, recurrent small bowel obstructions, seen by myself previously in triage presents for complaint of symptoms that remind her of previous small bowel obstructions.  Patient has a history of breast cancer and is about a month status post treatment for this.  She developed abdominal pain with vomiting in the early morning hours today.  She has had generalized abdominal pain.  She is not passing gas or having bowel movements.  Last episode of vomiting just prior to arrival.  Last small bowel obstruction was several months ago.  No dysuria, increased frequency or urgency.   No chest pain or shortness of breath but she is tachycardic on arrival to 151.      Home Medications Prior to Admission medications   Medication Sig Start Date End Date Taking? Authorizing Provider  acetaminophen (TYLENOL) 500 MG tablet Take 500-1,000 mg by mouth every 6 (six) hours as needed for mild pain.    [provider]  mesalamine (LIALDA) 1.2 g EC tablet Take 2 tablets (2.4 g total) by mouth 2 (two) times daily with a meal. 07/22/20   [name], MD  Multiple Vitamins-Minerals (MULTIVITAMIN WITH MINERALS) tablet Take 1 tablet by mouth daily.    [provider]  prochlorperazine (COMPAZINE) 10 MG tablet Take 1 tablet (10 mg total) by mouth every 6 (six) hours as needed (Nausea or vomiting). 06/16/20 11/19/20  Benay Pike, MD      Allergies    Nsaids and Other    Review of Systems   Review of Systems  Physical Exam Updated Vital Signs BP (!) 127/97   Pulse (!) 135   Temp 98.5 F (36.9 C) (Oral)   Resp (!) 22   Ht 5' 4"  (1.626 m)   Wt 63.5 kg   SpO2 99%   BMI 24.03 kg/m    Physical Exam Vitals and nursing note reviewed.  Constitutional:      General: She is not in acute distress.    Appearance: She is well-developed.  HENT:     Head: Normocephalic and atraumatic.     Right Ear: External ear normal.     Left Ear: External ear normal.     Nose: Nose normal.  Eyes:     Conjunctiva/sclera: Conjunctivae normal.  Cardiovascular:     Rate and Rhythm: Regular rhythm. Tachycardia present.     Heart sounds: No murmur heard. Pulmonary:     Effort: No respiratory distress.     Breath sounds: No wheezing, rhonchi or rales.  Abdominal:     General: There is no distension.     Palpations: Abdomen is soft.     Tenderness: There is generalized abdominal tenderness. There is no guarding or rebound.     Comments: Mild tenderness to palpation, nonfocal exam.  No significant distention.  No rebound or guarding.  Musculoskeletal:     Cervical back: Normal range of motion and neck supple.     Right lower leg: No edema.     Left lower leg: No edema.  Skin:    General: Skin is warm and dry.     Findings: No rash.  Neurological:     General: No  focal deficit present.     Mental Status: She is alert. Mental status is at baseline.     Motor: No weakness.  Psychiatric:        Mood and Affect: Mood normal.    ED Results / Procedures / Treatments   Labs (all labs ordered are listed, but only abnormal results are displayed) Labs Reviewed  COMPREHENSIVE METABOLIC PANEL - Abnormal; Notable for the following components:      Result Value   Glucose, Bld 122 (*)    BUN 22 (*)    Total Protein 8.7 (*)    Total Bilirubin 2.0 (*)    All other components within normal limits  CBC WITH DIFFERENTIAL/PLATELET  LIPASE, BLOOD  LACTIC ACID, PLASMA  LACTIC ACID, PLASMA    EKG EKG Interpretation  Date/Time:  Monday Sep 05 2021 18:55:39 EDT Ventricular Rate:  135 PR Interval:  110 QRS Duration: 84 QT Interval:  296 QTC Calculation: 444 R Axis:   72 Text  Interpretation: Sinus tachycardia Consider right atrial enlargement Consider left ventricular hypertrophy Nonspecific T abnormalities, lateral leads increased rate and nonspecific t changes from prior 10/22 Confirmed by Aletta Edouard [phone]) on 09/05/2021 7:10:50 PM  Radiology CT ABDOMEN PELVIS W CONTRAST  Result Date: 09/05/2021 CLINICAL DATA:  Generalized abdominal pain, vomiting. Bowel obstruction suspected EXAM: CT ABDOMEN AND PELVIS WITH CONTRAST TECHNIQUE: Multidetector CT imaging of the abdomen and pelvis was performed using the standard protocol following bolus administration of intravenous contrast. RADIATION DOSE REDUCTION: This exam was performed according to the departmental dose-optimization program which includes automated exposure control, adjustment of the mA and/or kV according to patient size and/or use of iterative reconstruction technique. CONTRAST:  178m OMNIPAQUE IOHEXOL 300 MG/ML  SOLN COMPARISON:  06/07/2021 FINDINGS: Lower chest: No acute abnormality Hepatobiliary: No focal hepatic abnormality. Gallbladder unremarkable. Pancreas: No focal abnormality or ductal dilatation. Spleen: No focal abnormality.  Normal size. Adrenals/Urinary Tract: No adrenal abnormality. No focal renal abnormality. No stones or hydronephrosis. Urinary bladder is unremarkable. Stomach/Bowel: Changes of subtotal colectomy. Significantly dilated small bowel loops with scattered air-fluid levels. Loops measure up to 4.3 cm. Transition is at the ileosigmoid anastomosis in the upper pelvis, similar to prior study. Vascular/Lymphatic: No evidence of aneurysm or adenopathy. Reproductive: Fibroid uterus.  No adnexal mass. Other: No free fluid or free air. Musculoskeletal: No acute bony abnormality. IMPRESSION: Recurrent small-bowel obstruction at the ileosigmoid anastomosis in the upper pelvis. Appearance is similar to prior study. Electronically Signed   By: KRolm BaptiseM.D.   On: 09/05/2021 20:28     Procedures Procedures    Medications Ordered in ED Medications  sodium chloride 0.9 % bolus 500 mL (has no administration in time range)  0.9 %  sodium chloride infusion (has no administration in time range)  sodium chloride 0.9 % bolus 1,000 mL (1,000 mLs Intravenous New Bag/Given 09/05/21 1949)  iohexol (OMNIPAQUE) 300 MG/ML solution 100 mL (100 mLs Intravenous Contrast Given 09/05/21 2011)    ED Course/ Medical Decision Making/ A&P    Patient seen and examined. History obtained directly from patient.   Labs/EKG: Ordered CBC, CMP, lipase, lactate.  EKG reviewed personally reviewed, shows sinus tachycardia at 135.  Imaging: Considered imaging of the abdomen pelvis to evaluate for bowel obstruction.  Awaiting initial labs.  Medications/Fluids: See fluid bolus, Zofran.  Most recent vital signs reviewed and are as follows: BP (!) 127/97   Pulse (!) 135   Temp 98.5 F (36.9 C) (Oral)   Resp (!) 22  Ht 5' 4"  (1.626 m)   Wt 63.5 kg   SpO2 99%   BMI 24.03 kg/m   Initial impression: Abdominal pain, rule out small bowel obstruction, sinus tachycardia  8:49 PM Reassessment performed. Patient appears stable, no vomiting.  Labs personally reviewed and interpreted including: CBC with differential with normal white blood cell count, normal hemoglobin; CMP with normal creatinine and electrolytes, glucose 122 with normal anion gap; lipase normal; lactate normal.  Imaging personally visualized and interpreted including: CT imaging of the abdomen pelvis demonstrates small bowel obstruction.  Reviewed pertinent lab work and imaging with patient at bedside. Questions answered.  Requesting no NG tube at this time.  Most current vital signs reviewed and are as follows: BP 114/87   Pulse (!) 124   Temp 98.5 F (36.9 C) (Oral)   Resp 20   Ht 5' 4"  (1.626 m)   Wt 63.5 kg   SpO2 99%   BMI 24.03 kg/m   Plan: Surgery consult.  Admit to hospital.   Patient discussed with: Dr.  Melina Copa.  9:26 PM I consulted with Dr. Barry Dienes by telephone.  Surgery will consult.  I consulted with Dr. Alcario Drought of [facility] hospitalist who will see for admission.                           Medical Decision Making Amount and/or Complexity of Data Reviewed Labs: ordered.   Patient with recurrent small bowel obstruction, history of colectomy for Crohn's disease.  She is tachycardic today, likely secondary to dehydration and pain.  Considered PE, however patient does not have shortness of breath or chest pain.        Final Clinical Impression(s) / ED Diagnoses Final diagnoses:  Small bowel obstruction ([place])  Sinus tachycardia    Rx / DC Orders ED Discharge Orders     None         Suann Larry 09/05/21 2127    Hayden Rasmussen, MD 09/06/21 1043

## 2021-09-05 NOTE — Assessment & Plan Note (Addendum)
S/P B mastectomies Completed Radiation in Nov 2022 Completed Trastuzumab with last dose 07/15/21 1. Followed by onc 2. Looks like they were going to put her on tamoxifen in Dec, but I dont see this med on her med-rec today... 1. Pt will discuss more with onc during follow up.

## 2021-09-05 NOTE — Assessment & Plan Note (Signed)
Cont mesalamine

## 2021-09-05 NOTE — ED Triage Notes (Signed)
Patient c/o generalized abdominal pain and emesis since 0230 today. Patient repots a history of bowel obstruction.  HR-151 in triage.

## 2021-09-05 NOTE — Assessment & Plan Note (Addendum)
SBO at site of prior anastamosis from surgery.  Same site SBO has occurred previously. Pt also admitted 01/2021 and 2x in Feb 2023 with SBO. 1. IVF 2. NPO 3. Zofran PRN 4. Morphine PRN if needed 5. Gen surg consult 6. Holding off on NGT for now unless abd pain or N/V becomes intractable, or surgery has strong opinion on this.

## 2021-09-05 NOTE — H&P (Addendum)
History and Physical    Patient: [name] SWN:[id number] DOB: [birth year] DOA: 09/05/2021 DOS: the patient was seen and examined on 09/05/2021 PCP: Pcp, No  Patient coming from: Home  Chief Complaint:  Chief Complaint  Patient presents with   Abdominal Pain   Emesis   HPI: [name] is a 46 y.o. female with medical history significant of Crohn's disease, breast CA s/p B mastectomies, chemo and radiation.  Pt with h/o prior SBOs  Pt with h/o subtotal colectomy with ileosigmoid anastomosis for colonic strictures.  Pt with h/o SBO at site of anastomosis in recent months.  4 recent admits for SBO: Oct 22, and 2 in Feb 23.  Pt presents to the ED today with c/o what she (correctly) suspects is recurrent SBO: specifically she developed abd pain with vomiting earlier this AM, abd pain is generalized.  Not passing gas or having BMs.  Had vomiting PTA.  No dysuria.   Review of Systems: As mentioned in the history of present illness. All other systems reviewed and are negative. Past Medical History:  Diagnosis Date   Cancer [facility])    Colon stricture ([place]) 05/07/2019   Crohn's colitis, with intestinal obstruction ([place]) 09/17/2013   Dx 2006 - right and left colon involved 2011 - pan-colitis 09/17/2013 left colitis 60-20 cm March 2021 subtotal colectomy ileosigmoid anastomosis for colonic strictures-Dr. Marcello Moores     Crohn's disease [facility])    Family history of breast cancer 06/11/2020   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 10/17/2010   Rectal bleeding    Uterine fibroid    Vitamin D deficiency 09/24/2013   Past Surgical History:  Procedure Laterality Date   AXILLARY LYMPH NODE DISSECTION Right 11/09/2020   Procedure: RIGHT AXILLARY LYMPH NODE DISSECTION;  Surgeon: [name], MD;  Location: [facility];  Service: General;  Laterality: Right;   BIOPSY  07/01/2020   Procedure: BIOPSY;  Surgeon: Yetta Flock, MD;   Location: [facility];  Service: Gastroenterology;;   BIOPSY  01/27/2021   Procedure: BIOPSY;  Surgeon: Yetta Flock, MD;  Location: [facility];  Service: Gastroenterology;;   BREAST RECONSTRUCTION WITH PLACEMENT OF TISSUE EXPANDER AND FLEX HD (ACELLULAR HYDRATED DERMIS) Bilateral 11/09/2020   Procedure: BILATERAL BREAST RECONSTRUCTION WITH PLACEMENT OF TISSUE EXPANDER AND FLEX HD (ACELLULAR HYDRATED DERMIS);  Surgeon: [name], MD;  Location: [place];  Service: Plastics;  Laterality: Bilateral;   CAPSULOTOMY Bilateral 07/25/2021   Procedure: CAPSULOTOMY;  Surgeon: [name], MD;  Location: [place];  Service: Plastics;  Laterality: Bilateral;   COLECTOMY  06/27/2019   COLONOSCOPY  2015   FLEXIBLE SIGMOIDOSCOPY N/A 07/01/2020   Procedure: FLEXIBLE SIGMOIDOSCOPY;  Surgeon: Yetta Flock, MD;  Location: [facility];  Service: Gastroenterology;  Laterality: N/A;   FLEXIBLE SIGMOIDOSCOPY N/A 01/27/2021   Procedure: FLEXIBLE SIGMOIDOSCOPY;  Surgeon: Yetta Flock, MD;  Location[facility];  Service: Gastroenterology;  Laterality: N/A;   NIPPLE SPARING MASTECTOMY Bilateral 11/09/2020   Procedure: BILATERAL NIPPLE SPARING MASTECTOMY;  Surgeon: [name], MD;  Location: [place];  Service: General;  Laterality: Bilateral;   PORT-A-CATH REMOVAL N/A 07/25/2021   Procedure: REMOVAL PORT-A-CATH;  Surgeon: [name], MD;  Location: [place];  Service: Plastics;  Laterality: N/A;   PORTACATH PLACEMENT N/A 06/23/2020   Procedure: INSERTION PORT-A-CATH;  Surgeon: [name], MD;  Location: [place];  Service: General;  Laterality: N/A;  START TIME OF 11:00 AM FOR 60 MINUTES [place] [facility]   REMOVAL OF BILATERAL TISSUE EXPANDERS WITH PLACEMENT OF BILATERAL BREAST IMPLANTS Bilateral 07/25/2021   Procedure: REMOVAL OF BILATERAL TISSUE EXPANDERS WITH PLACEMENT OF BILATERAL  BREAST IMPLANTS;  Surgeon: [name], MD;  Location: [place];  Service: Plastics;  Laterality: Bilateral;  1.5   Social History:  reports that she has quit smoking. Her smoking use included cigarettes. She has never used smokeless tobacco. She reports current alcohol use of about 1.0 - 2.0 standard drink per week. She reports that she does not use drugs.  Allergies  Allergen Reactions   Nsaids Other (See Comments)    Crohns disease/ IBD   Other     BLOOD PRODUCT REFUSAL    Family History  Problem Relation Age of Onset   Diabetes Father    Breast cancer Maternal Aunt        dx unknown age   Diabetes Paternal 48    Breast cancer Cousin        maternal cousin; dx unknown age   Esophageal cancer Neg Hx    Rectal cancer Neg Hx    Stomach cancer Neg Hx    Colon polyps Neg Hx    Colon cancer Neg Hx     Prior to Admission medications   Medication Sig Start Date End Date Taking? Authorizing Provider  acetaminophen (TYLENOL) 500 MG tablet Take 500-1,000 mg by mouth every 6 (six) hours as needed for mild pain.   Yes [provider]  Ferrous Sulfate (IRON PO) Take 1 tablet by mouth daily.   Yes [provider]  mesalamine (LIALDA) 1.2 g EC tablet Take 2 tablets (2.4 g total) by mouth 2 (two) times daily with a meal. 07/22/20  Yes [name], MD  Multiple Vitamins-Minerals (MULTIVITAMIN WITH MINERALS) tablet Take 1 tablet by mouth daily.   Yes [provider]  potassium chloride (KLOR-CON M) 10 MEQ tablet Take 10 mEq by mouth daily.   Yes [provider]  vitamin B-12 (CYANOCOBALAMIN) 100 MCG tablet Take 100 mcg by mouth daily.   Yes [provider]  prochlorperazine (COMPAZINE) 10 MG tablet Take 1 tablet (10 mg total) by mouth every 6 (six) hours as needed (Nausea or vomiting). 06/16/20 11/19/20  Benay Pike, MD    Physical Exam: Vitals:   09/05/21 1945 09/05/21 2100 09/05/21 2145 09/05/21 2207  BP: 114/87 (!)  133/98 117/86 (!) 141/95  Pulse: (!) 124 (!) 112 (!) 117 (!) 117  Resp: 20 (!) 24 20 18   Temp:   98.7 F (37.1 C) 99.5 F (37.5 C)  TempSrc:    Oral  SpO2: 99% 100% 99% 99%  Weight:      Height:       Constitutional: NAD, calm, comfortable Eyes: PERRL, lids and conjunctivae normal ENMT: Mucous membranes are moist. Posterior pharynx clear of any exudate or lesions.Normal dentition.  Neck: normal, supple, no masses, no thyromegaly Respiratory: clear to auscultation bilaterally, no wheezing, no crackles. Normal respiratory effort. No accessory muscle use.  Cardiovascular: Tachycardia present Abdomen: Generalized TTP, no guarding, no rebound Musculoskeletal: no clubbing / cyanosis. No joint deformity upper and lower extremities. Good ROM, no contractures. Normal muscle tone.  Skin: no rashes, lesions, ulcers. No induration Neurologic: CN 2-12 grossly intact. Sensation intact, DTR normal. Strength 5/5 in all 4.  Psychiatric: Normal judgment and insight. Alert and oriented x 3. Normal mood.   Data Reviewed:    CT AP  sure enough shows SBO with transition point at the site of anastomosis just like previously.  Assessment and Plan: * SBO (small bowel obstruction) (HCC) SBO at site of prior anastamosis from surgery.  Same site SBO has occurred previously. Pt also admitted 01/2021 and 2x in Feb 2023 with SBO. IVF NPO Zofran PRN Morphine PRN if needed Gen surg consult Holding off on NGT for now unless abd pain or N/V becomes intractable, or surgery has strong opinion on this.  Crohn's disease of small and large intestines with complication ([place]) Cont mesalamine  Malignant neoplasm of upper-outer quadrant of right breast in female, estrogen receptor positive ([place]) S/P B mastectomies Completed Radiation in Nov 2022 Completed Trastuzumab with last dose 07/15/21 Followed by onc Looks like they were going to put her on tamoxifen in Dec, but I dont see this med on her med-rec today... Pt  will discuss more with onc during follow up.      Advance Care Planning:   Code Status: Full Code  Consults: General surgery  Family Communication: No family in room  Severity of Illness: The appropriate patient status for this patient is OBSERVATION. Observation status is judged to be reasonable and necessary in order to provide the required intensity of service to ensure the patient's safety. The patient's presenting symptoms, physical exam findings, and initial radiographic and laboratory data in the context of their medical condition is felt to place them at decreased risk for further clinical deterioration. Furthermore, it is anticipated that the patient will be medically stable for discharge from the hospital within 2 midnights of admission.   Author: Etta Quill., DO 09/05/2021 10:15 PM  For on call review [email or web address].

## 2021-09-05 NOTE — Consult Note (Signed)
Reason for Consult:SBO Referring: Courtney Oh, PA  [name] is an 46 y.o. female.  HPI:  Pt is a 46 yo F with h/o crohn's dx s/p subtotal colectomy with ileosigmoid anastamosis by Dr. Marcello Moores in 2021.  She has had a recent h/o breast cancer and has had bilat mastectomies and chemo over the last year.  She presents with around 12-18 hours of abdominal pain, n/v.  She has stopped having flatus or BMs.  She has had 4 admissions for SBO in last year and felt that this was what it was.  She had implant switchout in April with Dr. Claudia Desanctis.    She is on mesalamine for crohn's under direction of Dr. Carlean Purl.  She said crohn's was gone until chemo seemed to reactivate it.  She had flex sig 01/2022 with evidence of crohn's in the ileum and rectum.    Past Medical History:  Diagnosis Date   Cancer [facility])    Colon stricture ([place]) 05/07/2019   Crohn's colitis, with intestinal obstruction ([place]) 09/17/2013   Dx 2006 - right and left colon involved 2011 - pan-colitis 09/17/2013 left colitis 60-20 cm March 2021 subtotal colectomy ileosigmoid anastomosis for colonic strictures-Dr. Marcello Moores     Crohn's disease [facility])    Family history of breast cancer 06/11/2020   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 10/17/2010   Rectal bleeding    Uterine fibroid    Vitamin D deficiency 09/24/2013    Past Surgical History:  Procedure Laterality Date   AXILLARY LYMPH NODE DISSECTION Right 11/09/2020   Procedure: RIGHT AXILLARY LYMPH NODE DISSECTION;  Surgeon: [name], MD;  Location: [place];  Service: General;  Laterality: Right;   BIOPSY  07/01/2020   Procedure: BIOPSY;  Surgeon: Yetta Flock, MD;  Location: [facility];  Service: Gastroenterology;;   BIOPSY  01/27/2021   Procedure: BIOPSY;  Surgeon: Yetta Flock, MD;  Location: [facility];  Service: Gastroenterology;;   BREAST RECONSTRUCTION WITH PLACEMENT OF TISSUE EXPANDER AND FLEX HD  (ACELLULAR HYDRATED DERMIS) Bilateral 11/09/2020   Procedure: BILATERAL BREAST RECONSTRUCTION WITH PLACEMENT OF TISSUE EXPANDER AND FLEX HD (ACELLULAR HYDRATED DERMIS);  Surgeon: [name], MD;  Location: [place];  Service: Plastics;  Laterality: Bilateral;   CAPSULOTOMY Bilateral 07/25/2021   Procedure: CAPSULOTOMY;  Surgeon: [name], MD;  Location: [place];  Service: Plastics;  Laterality: Bilateral;   COLECTOMY  06/27/2019   COLONOSCOPY  2015   FLEXIBLE SIGMOIDOSCOPY N/A 07/01/2020   Procedure: FLEXIBLE SIGMOIDOSCOPY;  Surgeon: Yetta Flock, MD;  [facility];  Service: Gastroenterology;  Laterality: N/A;   FLEXIBLE SIGMOIDOSCOPY N/A 01/27/2021   Procedure: FLEXIBLE SIGMOIDOSCOPY;  Surgeon: Yetta Flock, MD;  [facility];  Service: Gastroenterology;  Laterality: N/A;   NIPPLE SPARING MASTECTOMY Bilateral 11/09/2020   Procedure: BILATERAL NIPPLE SPARING MASTECTOMY;  Surgeon: [name], MD;  Location: [place];  Service: General;  Laterality: Bilateral;   PORT-A-CATH REMOVAL N/A 07/25/2021   Procedure: REMOVAL PORT-A-CATH;  Surgeon: [name], MD;  Location: [place];  Service: Plastics;  Laterality: N/A;   PORTACATH PLACEMENT N/A 06/23/2020   Procedure: INSERTION PORT-A-CATH;  Surgeon: [name], MD;  Location: [place];  Service: General;  Laterality: N/A;  START TIME OF 11:00 AM FOR 60 MINUTES [place] [facility]   REMOVAL OF BILATERAL TISSUE EXPANDERS WITH PLACEMENT OF BILATERAL BREAST IMPLANTS Bilateral 07/25/2021   Procedure: REMOVAL  OF BILATERAL TISSUE EXPANDERS WITH PLACEMENT OF BILATERAL BREAST IMPLANTS;  Surgeon: [name], MD;  Location: [place];  Service: Plastics;  Laterality: Bilateral;  1.5    Family History  Problem Relation Age of Onset   Diabetes Father    Breast cancer Maternal Aunt        dx unknown age    Diabetes Paternal 48    Breast cancer Cousin        maternal cousin; dx unknown age   Esophageal cancer Neg Hx    Rectal cancer Neg Hx    Stomach cancer Neg Hx    Colon polyps Neg Hx    Colon cancer Neg Hx     Social History:  reports that she has quit smoking. Her smoking use included cigarettes. She has never used smokeless tobacco. She reports current alcohol use of about 1.0 - 2.0 standard drink per week. She reports that she does not use drugs.  Allergies:  Allergies  Allergen Reactions   Nsaids Other (See Comments)    Crohns disease/ IBD   Other     BLOOD PRODUCT REFUSAL    Medications:  Current Meds  Medication Sig   acetaminophen (TYLENOL) 500 MG tablet Take 500-1,000 mg by mouth every 6 (six) hours as needed for mild pain.   Ferrous Sulfate (IRON PO) Take 1 tablet by mouth daily.   mesalamine (LIALDA) 1.2 g EC tablet Take 2 tablets (2.4 g total) by mouth 2 (two) times daily with a meal.   Multiple Vitamins-Minerals (MULTIVITAMIN WITH MINERALS) tablet Take 1 tablet by mouth daily.   potassium chloride (KLOR-CON M) 10 MEQ tablet Take 10 mEq by mouth daily.   vitamin B-12 (CYANOCOBALAMIN) 100 MCG tablet Take 100 mcg by mouth daily.     Results for orders placed or performed during the hospital encounter of 09/05/21 (from the past 48 hour(s))  CBC with Differential     Status: None   Collection Time: 09/05/21  6:43 PM  Result Value Ref Range   WBC 6.6 4.0 - 10.5 K/uL   RBC 5.07 3.87 - 5.11 MIL/uL   Hemoglobin 14.1 12.0 - 15.0 g/dL   HCT 42.2 36.0 - 46.0 %   MCV 83.2 80.0 - 100.0 fL   MCH 27.8 26.0 - 34.0 pg   MCHC 33.4 30.0 - 36.0 g/dL   RDW 14.7 11.5 - 15.5 %   Platelets 334 150 - 400 K/uL   nRBC 0.0 0.0 - 0.2 %   Neutrophils Relative % 74 %   Neutro Abs 4.9 1.7 - 7.7 K/uL   Lymphocytes Relative 16 %   Lymphs Abs 1.1 0.7 - 4.0 K/uL   Monocytes Relative 10 %   Monocytes Absolute 0.7 0.1 - 1.0 K/uL   Eosinophils Relative 0 %   Eosinophils Absolute 0.0  0.0 - 0.5 K/uL   Basophils Relative 0 %   Basophils Absolute 0.0 0.0 - 0.1 K/uL   Immature Granulocytes 0 %   Abs Immature Granulocytes 0.02 0.00 - 0.07 K/uL    Comment: Performed at [facility], [place] [address] [place], [place] [address]  Comprehensive metabolic panel     Status: Abnormal   Collection Time: 09/05/21  6:43 PM  Result Value Ref Range   Sodium 138 135 - 145 mmol/L   Potassium 3.6 3.5 - 5.1 mmol/L   Chloride 100 98 - 111 mmol/L   CO2 24 22 - 32 mmol/L   Glucose, Bld 122 (H) 70 -  99 mg/dL    Comment: Glucose reference range applies only to samples taken after fasting for at least 8 hours.   BUN 22 (H) 6 - 20 mg/dL   Creatinine, Ser 0.96 0.44 - 1.00 mg/dL   Calcium 9.6 8.9 - 10.3 mg/dL   Total Protein 8.7 (H) 6.5 - 8.1 g/dL   Albumin 4.3 3.5 - 5.0 g/dL   AST 23 15 - 41 U/L   ALT 20 0 - 44 U/L   Alkaline Phosphatase 113 38 - 126 U/L   Total Bilirubin 2.0 (H) 0.3 - 1.2 mg/dL   GFR, Estimated >60 >60 mL/min    Comment: (NOTE) Calculated using the CKD-EPI Creatinine Equation (2021)    Anion gap 14 5 - 15    Comment: Performed at [facility], [place] [address], [place], [place] [address]  Lipase, blood     Status: None   Collection Time: 09/05/21  6:43 PM  Result Value Ref Range   Lipase 26 11 - 51 U/L    Comment: Performed at [facility], [place] [address], [place], [place] [address]  Lactic acid, plasma     Status: None   Collection Time: 09/05/21  7:51 PM  Result Value Ref Range   Lactic Acid, Venous 1.3 0.5 - 1.9 mmol/L    Comment: Performed at [facility], [place] [address], [place], [place] [address]  Lactic acid, plasma     Status: None   Collection Time: 09/05/21 10:24 PM  Result Value Ref Range   Lactic Acid, Venous 1.7 0.5 - 1.9 mmol/L    Comment: Performed at [facility], [place] [address] [place], [place] [address]    CT ABDOMEN PELVIS W CONTRAST  Result Date:  09/05/2021 CLINICAL DATA:  Generalized abdominal pain, vomiting. Bowel obstruction suspected EXAM: CT ABDOMEN AND PELVIS WITH CONTRAST TECHNIQUE: Multidetector CT imaging of the abdomen and pelvis was performed using the standard protocol following bolus administration of intravenous contrast. RADIATION DOSE REDUCTION: This exam was performed according to the departmental dose-optimization program which includes automated exposure control, adjustment of the mA and/or kV according to patient size and/or use of iterative reconstruction technique. CONTRAST:  150m OMNIPAQUE IOHEXOL 300 MG/ML  SOLN COMPARISON:  06/07/2021 FINDINGS: Lower chest: No acute abnormality Hepatobiliary: No focal hepatic abnormality. Gallbladder unremarkable. Pancreas: No focal abnormality or ductal dilatation. Spleen: No focal abnormality.  Normal size. Adrenals/Urinary Tract: No adrenal abnormality. No focal renal abnormality. No stones or hydronephrosis. Urinary bladder is unremarkable. Stomach/Bowel: Changes of subtotal colectomy. Significantly dilated small bowel loops with scattered air-fluid levels. Loops measure up to 4.3 cm. Transition is at the ileosigmoid anastomosis in the upper pelvis, similar to prior study. Vascular/Lymphatic: No evidence of aneurysm or adenopathy. Reproductive: Fibroid uterus.  No adnexal mass. Other: No free fluid or free air. Musculoskeletal: No acute bony abnormality. IMPRESSION: Recurrent small-bowel obstruction at the ileosigmoid anastomosis in the upper pelvis. Appearance is similar to prior study. Electronically Signed   By: KRolm BaptiseM.D.   On: 09/05/2021 20:28    Review of Systems  Gastrointestinal:  Positive for abdominal distention, abdominal pain, nausea and vomiting.  All other systems reviewed and are negative.   Blood pressure (!) 141/95, pulse (!) 117, temperature 99.5 F (37.5 C), temperature source Oral, resp. rate 18, height 5' 4"  (1.626 m), weight 63.5 kg, SpO2 99 %. Physical  Exam Vitals reviewed.  Constitutional:      General: Distressed: looks uncomfortable.  Appearance: She is well-developed.  HENT:     Head: Normocephalic and atraumatic.     Mouth/Throat:     Mouth: Mucous membranes are moist.  Cardiovascular:     Rate and Rhythm: Regular rhythm. Tachycardia present.     Heart sounds: Normal heart sounds. No murmur heard.   No friction rub. No gallop.  Pulmonary:     Effort: Pulmonary effort is normal. No respiratory distress.     Breath sounds: Normal breath sounds. No stridor. No wheezing.  Abdominal:     General: Abdomen is protuberant. Bowel sounds are decreased. There is distension (mild).     Palpations: Abdomen is soft. There is no shifting dullness, hepatomegaly, splenomegaly or mass.     Tenderness: There is no abdominal tenderness.  Skin:    General: Skin is warm and dry.     Capillary Refill: Capillary refill takes 2 to 3 seconds.     Coloration: Skin is not cyanotic, jaundiced, mottled or pale.     Findings: No erythema or rash.  Neurological:     General: No focal deficit present.     Mental Status: She is alert and oriented to person, place, and time.  Psychiatric:        Mood and Affect: Mood normal. Mood is not anxious or depressed.        Behavior: Behavior normal.     Assessment/Plan: Recurrent SBO Crohn's Right breast cancer, cT4dN1, grade 3, +/-/+, [id number]  NPO NGT Now off chemotherapy.  Fourth admit for SBO in last 7 months.  Evidence of crohn's in 01/2022. Pt recently went down to 1 mesalamine per day instead of two.  Consult GI to see if steroid taper or other increased immunotherapy would be beneficial.  Pt not eager to have surgery.   Surgery to follow.    FStark Klein5/29/2023, 11:37 PM

## 2021-09-06 ENCOUNTER — Inpatient Hospital Stay (HOSPITAL_COMMUNITY): Payer: Managed Care, Other (non HMO)

## 2021-09-06 ENCOUNTER — Observation Stay (HOSPITAL_COMMUNITY): Payer: Managed Care, Other (non HMO)

## 2021-09-06 DIAGNOSIS — D649 Anemia, unspecified: Secondary | ICD-10-CM | POA: Diagnosis not present

## 2021-09-06 DIAGNOSIS — K50819 Crohn's disease of both small and large intestine with unspecified complications: Secondary | ICD-10-CM | POA: Diagnosis not present

## 2021-09-06 DIAGNOSIS — Z980 Intestinal bypass and anastomosis status: Secondary | ICD-10-CM | POA: Diagnosis not present

## 2021-09-06 DIAGNOSIS — E559 Vitamin D deficiency, unspecified: Secondary | ICD-10-CM | POA: Diagnosis present

## 2021-09-06 DIAGNOSIS — K6289 Other specified diseases of anus and rectum: Secondary | ICD-10-CM | POA: Diagnosis not present

## 2021-09-06 DIAGNOSIS — Z923 Personal history of irradiation: Secondary | ICD-10-CM | POA: Diagnosis not present

## 2021-09-06 DIAGNOSIS — R000 Tachycardia, unspecified: Secondary | ICD-10-CM | POA: Diagnosis present

## 2021-09-06 DIAGNOSIS — Z79899 Other long term (current) drug therapy: Secondary | ICD-10-CM | POA: Diagnosis not present

## 2021-09-06 DIAGNOSIS — Z9221 Personal history of antineoplastic chemotherapy: Secondary | ICD-10-CM | POA: Diagnosis not present

## 2021-09-06 DIAGNOSIS — Z888 Allergy status to other drugs, medicaments and biological substances status: Secondary | ICD-10-CM | POA: Diagnosis not present

## 2021-09-06 DIAGNOSIS — D509 Iron deficiency anemia, unspecified: Secondary | ICD-10-CM | POA: Diagnosis present

## 2021-09-06 DIAGNOSIS — Z803 Family history of malignant neoplasm of breast: Secondary | ICD-10-CM | POA: Diagnosis not present

## 2021-09-06 DIAGNOSIS — K56609 Unspecified intestinal obstruction, unspecified as to partial versus complete obstruction: Secondary | ICD-10-CM | POA: Diagnosis not present

## 2021-09-06 DIAGNOSIS — Z9049 Acquired absence of other specified parts of digestive tract: Secondary | ICD-10-CM | POA: Diagnosis not present

## 2021-09-06 DIAGNOSIS — Z87891 Personal history of nicotine dependence: Secondary | ICD-10-CM | POA: Diagnosis not present

## 2021-09-06 DIAGNOSIS — K50812 Crohn's disease of both small and large intestine with intestinal obstruction: Secondary | ICD-10-CM | POA: Diagnosis present

## 2021-09-06 DIAGNOSIS — Z9013 Acquired absence of bilateral breasts and nipples: Secondary | ICD-10-CM | POA: Diagnosis not present

## 2021-09-06 DIAGNOSIS — D72819 Decreased white blood cell count, unspecified: Secondary | ICD-10-CM | POA: Diagnosis present

## 2021-09-06 DIAGNOSIS — Z853 Personal history of malignant neoplasm of breast: Secondary | ICD-10-CM | POA: Diagnosis not present

## 2021-09-06 LAB — CBC
HCT: 36.2 % (ref 36.0–46.0)
Hemoglobin: 11.7 g/dL — ABNORMAL LOW (ref 12.0–15.0)
MCH: 27.4 pg (ref 26.0–34.0)
MCHC: 32.3 g/dL (ref 30.0–36.0)
MCV: 84.8 fL (ref 80.0–100.0)
Platelets: 244 10*3/uL (ref 150–400)
RBC: 4.27 MIL/uL (ref 3.87–5.11)
RDW: 14.6 % (ref 11.5–15.5)
WBC: 2.8 10*3/uL — ABNORMAL LOW (ref 4.0–10.5)
nRBC: 0 % (ref 0.0–0.2)

## 2021-09-06 LAB — SEDIMENTATION RATE: Sed Rate: 26 mm/hr — ABNORMAL HIGH (ref 0–22)

## 2021-09-06 LAB — BASIC METABOLIC PANEL
Anion gap: 9 (ref 5–15)
BUN: 19 mg/dL (ref 6–20)
CO2: 23 mmol/L (ref 22–32)
Calcium: 8.5 mg/dL — ABNORMAL LOW (ref 8.9–10.3)
Chloride: 106 mmol/L (ref 98–111)
Creatinine, Ser: 0.87 mg/dL (ref 0.44–1.00)
GFR, Estimated: >60 mL/min (ref >60)
Glucose, Bld: 103 mg/dL — ABNORMAL HIGH (ref 70–99)
Potassium: 3.7 mmol/L (ref 3.5–5.1)
Sodium: 138 mmol/L (ref 135–145)

## 2021-09-06 LAB — C-REACTIVE PROTEIN: CRP: 5.3 mg/dL — ABNORMAL HIGH (ref <1.0)

## 2021-09-06 LAB — HIV ANTIBODY (ROUTINE TESTING W REFLEX): HIV Screen 4th Generation wRfx: NONREACTIVE

## 2021-09-06 MED ORDER — METHYLPREDNISOLONE SODIUM SUCC 40 MG IJ SOLR
40.0000 mg | Freq: Every day | INTRAMUSCULAR | Status: DC
  Administered 2021-09-06 – 2021-09-08 (×2): 40 mg via INTRAVENOUS
  Filled 2021-09-06 (×3): qty 1

## 2021-09-06 MED ORDER — DIATRIZOATE MEGLUMINE & SODIUM 66-10 % PO SOLN
90.0000 mL | Freq: Once | ORAL | Status: AC
  Administered 2021-09-06: 90 mL via ORAL
  Filled 2021-09-06: qty 90

## 2021-09-06 NOTE — Progress Notes (Signed)
Subjective: CC: Patient reports she was having intermittent cramping generalized abdominal pain every 30 minutes until she received IV pain medication around 9 AM.  Since then her abdominal pain has resolved. She currently has no pain.  No nausea or vomiting since admission.  Feels less bloated/distended since admission as well. Still no flatus or BM. This episode feels similar to prior sbo's but less intense.   Objective: Vital signs in last 24 hours: Temp:  [97.8 F (36.6 C)-99.5 F (37.5 C)] 97.8 F (36.6 C) (05/30 0937) Pulse Rate:  [95-151] 95 (05/30 0937) Resp:  [16-24] 18 (05/30 0937) BP: (114-141)/(80-98) 128/87 (05/30 0937) SpO2:  [99 %-100 %] 99 % (05/30 0937) Weight:  [63.5 kg] 63.5 kg (05/29 1844) Last BM Date : 09/04/21  Intake/Output from previous day: 05/29 0701 - 05/30 0700 In: 2486.5 [P.O.:60; I.V.:1010.3; IV Piggyback:1416.3] Out: -  Intake/Output this shift: No intake/output data recorded.  PE: Gen:  Alert, NAD, pleasant Pulm: Normal rate and effort  Abd: Mild distension but soft, completely NT, some bs but still hypoactive.  Psych: A&Ox3   Lab Results:  Recent Labs    09/05/21 1843 09/06/21 0413  WBC 6.6 2.8*  HGB 14.1 11.7*  HCT 42.2 36.2  PLT 334 244   BMET Recent Labs    09/05/21 1843 09/06/21 0413  NA 138 138  K 3.6 3.7  CL 100 106  CO2 24 23  GLUCOSE 122* 103*  BUN 22* 19  CREATININE 0.96 0.87  CALCIUM 9.6 8.5*   PT/INR No results for input(s): LABPROT, INR in the last 72 hours. CMP     Component Value Date/Time   NA 138 09/06/2021 0413   NA 135 01/21/2020 1056   K 3.7 09/06/2021 0413   CL 106 09/06/2021 0413   CO2 23 09/06/2021 0413   GLUCOSE 103 (H) 09/06/2021 0413   BUN 19 09/06/2021 0413   BUN 12 01/21/2020 1056   CREATININE 0.87 09/06/2021 0413   CREATININE 1.17 (H) 07/15/2021 1016   CALCIUM 8.5 (L) 09/06/2021 0413   PROT 8.7 (H) 09/05/2021 1843   ALBUMIN 4.3 09/05/2021 1843   AST 23 09/05/2021 1843   AST  16 07/15/2021 1016   ALT 20 09/05/2021 1843   ALT 21 07/15/2021 1016   ALKPHOS 113 09/05/2021 1843   BILITOT 2.0 (H) 09/05/2021 1843   BILITOT 1.1 07/15/2021 1016   GFRNONAA >60 09/06/2021 0413   GFRNONAA 59 (L) 07/15/2021 1016   GFRAA 108 01/21/2020 1056   Lipase     Component Value Date/Time   LIPASE 26 09/05/2021 1843    Studies/Results: CT ABDOMEN PELVIS W CONTRAST  Result Date: 09/05/2021 CLINICAL DATA:  Generalized abdominal pain, vomiting. Bowel obstruction suspected EXAM: CT ABDOMEN AND PELVIS WITH CONTRAST TECHNIQUE: Multidetector CT imaging of the abdomen and pelvis was performed using the standard protocol following bolus administration of intravenous contrast. RADIATION DOSE REDUCTION: This exam was performed according to the departmental dose-optimization program which includes automated exposure control, adjustment of the mA and/or kV according to patient size and/or use of iterative reconstruction technique. CONTRAST:  171m OMNIPAQUE IOHEXOL 300 MG/ML  SOLN COMPARISON:  06/07/2021 FINDINGS: Lower chest: No acute abnormality Hepatobiliary: No focal hepatic abnormality. Gallbladder unremarkable. Pancreas: No focal abnormality or ductal dilatation. Spleen: No focal abnormality.  Normal size. Adrenals/Urinary Tract: No adrenal abnormality. No focal renal abnormality. No stones or hydronephrosis. Urinary bladder is unremarkable. Stomach/Bowel: Changes of subtotal colectomy. Significantly dilated small bowel loops with scattered  air-fluid levels. Loops measure up to 4.3 cm. Transition is at the ileosigmoid anastomosis in the upper pelvis, similar to prior study. Vascular/Lymphatic: No evidence of aneurysm or adenopathy. Reproductive: Fibroid uterus.  No adnexal mass. Other: No free fluid or free air. Musculoskeletal: No acute bony abnormality. IMPRESSION: Recurrent small-bowel obstruction at the ileosigmoid anastomosis in the upper pelvis. Appearance is similar to prior study.  Electronically Signed   By: Rolm Baptise M.D.   On: 09/05/2021 20:28    Anti-infectives: Anti-infectives (From admission, onward)    None        Assessment/Plan Recurrent SBO - 4th admit for sbo in the last year Hx Crohn's s/p robotic assisted subtotal colectomy with ileo-sigmoid anastomosis 06/27/19 by Dr. Marcello Moores.  On maintenance Mesalamine - CT A/P w/ sbo at the ileosigmoid anastomosis.  - Can hold off on ngt for now and do SBO protocol orally. If patient develops any worsening abdominal pain, distension, n/v would place ngt.  - Patient had flex sig in 10/22 that showed a patient ileo-sigmoid surgical anastomosis that was able to be transversed and showed some disease of the ileum. Would consult GI to see if steroid taper or other increased immunotherapy would be beneficial.  - Hopefully she will improve with conservative management. If is not improving with conservative management she may need exploratory surgery during this admission - If patient improves with conservative management we discussed following up in the office with Dr. Marcello Moores to discuss possible elective surgery given this is her fourth admission for SBO in the last year.  She would like to avoid any surgery if it at all possible. Can follow up PRN.    FEN: NPO, IVF VTE: SCDs, okay for chemical prophylaxis from general surgery standpoint ID: None   LOS: 0 days    Jillyn Ledger , [facility] 09/06/2021, 10:00 AM Please see Amion for pager number during day hours 7:00am-4:30pm

## 2021-09-06 NOTE — Progress Notes (Signed)
PROGRESS NOTE   [name]  KPQ:[id number]    DOB: 06/26/1975    DOA: 09/05/2021  PCP: Pcp, No   I have briefly reviewed patients previous medical records in [facility].  Chief Complaint  Patient presents with   Abdominal Pain   Emesis    Brief Narrative:  46 year old female, PMH of Crohn's ileocolitis, s/p subtotal colectomy with ileosigmoid anastomosis, recurrent SBO's, breast cancer s/p bilateral mastectomies, chemoradiation, iron deficiency anemia, vitamin D deficiency, presented with abdominal pain, emesis and admitted for another episode of SBO.  Treating conservatively.  General surgery consulted and recommended [facility] consultation.   Assessment & Plan:  Principal Problem:   SBO (small bowel obstruction) (HCC) Active Problems:   Crohn's disease of small and large intestines with complication (HCC)   Malignant neoplasm of upper-outer quadrant of right breast in female, estrogen receptor positive ([place])   Recurrent SBO:  Reportedly fourth admit for SBO in the last year. CT A/P shows SBO at the ileosigmoid anastomosis. General surgery consultation appreciated.  Trial of SBO protocol and holding off on NG tube unless she starts having worsening abdominal pain, distention and vomiting.  They recommended GI consultation to optimize Crohn's treatment with the hope to minimize the recurrent SBO's.  Obviously if not improving with conservative management, may need surgery this admission. Etiology of her SBO is unclear.?  Adhesions versus active Crohn's. Is on Lialda. Reports that her mesalamine dose was reduced from twice daily to once daily and wonders if this may be related to that. [place] [facility] input appreciated, n.p.o., IVF, SBO protocol, checking ESR, CRP and fecal calprotectin, possible repeat flexible sigmoidoscopy/ileoscopy this admission and if no active Crohn's found then may benefit from laparoscopic and LOA.  Crohn's ileocolitis [facility] input  appreciated. Mesalamine increased to 2.4 g twice daily Treating as Crohn's flare with IV Solu-Medrol 40 mg daily. Rest of evaluation and management as above.  Breast cancer S/p chemotherapy followed by bilateral mastectomies and then neoadjuvant treatment. Follows with Dr. Lindi Adie oncology.  Iron deficiency anemia: Hemoglobin 14.1 on admission has dropped to 11.7 which may be her baseline or could be due to hemodilution from IVF. Follow CBC in AM.  Leukopenia WBC has dropped from 6.6-2.8.  Etiology unclear.?  Lab error. Follow CBC in AM.  Body mass index is 24.03 kg/m.   DVT prophylaxis: enoxaparin (LOVENOX) injection 40 mg Start: 09/06/21 1000     Code Status: Full Code:  Family Communication: None at bedside. Disposition:  Status is: Observation The patient will require care spanning > 2 midnights and should be moved to inpatient because: Further evaluation and management of SBO which may need further procedures.     Consultants:   General surgery [facility]  Procedures:   None  Antimicrobials:      Subjective:  Seen this morning.  Abdominal pain, mid abdomen, 7/10 prior to meds and 0/10 after morphine.  No BM/flatus/nausea/vomiting since admission.  No other complaints reported.  Wonders if her flare is related to green tea that she recently started drinking or cutting back on mesalamine  Objective:   Vitals:   09/06/21 0145 09/06/21 0550 09/06/21 0937 09/06/21 1254  BP: 133/85 122/84 128/87 122/84  Pulse: (!) 114 (!) 106 95 (!) 101  Resp: 16 16 18 16   Temp:  98.6 F (37 C) 97.8 F (36.6 C) 98.3 F (36.8 C)  TempSrc:  Oral  Oral  SpO2: 99% 99% 99% 100%  Weight:  Height:        General exam: Young female, moderately built and nourished lying comfortably supine in bed without distress.  Oral mucosa moist. Respiratory system: Clear to auscultation. Respiratory effort normal. Cardiovascular system: S1 & S2 heard, RRR. No JVD, murmurs, rubs, gallops or  clicks. No pedal edema. Gastrointestinal system: Abdomen is nondistended, soft and nontender. No organomegaly or masses felt. Normal bowel sounds heard. Central nervous system: Alert and oriented. No focal neurological deficits. Extremities: Symmetric 5 x 5 power. Skin: No rashes, lesions or ulcers Psychiatry: Judgement and insight appear normal. Mood & affect appropriate.     Data Reviewed:   I have personally reviewed following labs and imaging studies   CBC: Recent Labs  Lab 09/05/21 1843 09/06/21 0413  WBC 6.6 2.8*  NEUTROABS 4.9  --   HGB 14.1 11.7*  HCT 42.2 36.2  MCV 83.2 84.8  PLT 334 453    Basic Metabolic Panel: Recent Labs  Lab 09/05/21 1843 09/06/21 0413  NA 138 138  K 3.6 3.7  CL 100 106  CO2 24 23  GLUCOSE 122* 103*  BUN 22* 19  CREATININE 0.96 0.87  CALCIUM 9.6 8.5*    Liver Function Tests: Recent Labs  Lab 09/05/21 1843  AST 23  ALT 20  ALKPHOS 113  BILITOT 2.0*  PROT 8.7*  ALBUMIN 4.3    CBG: No results for input(s): GLUCAP in the last 168 hours.  Microbiology Studies:  No results found for this or any previous visit (from the past 240 hour(s)).  Radiology Studies:  CT ABDOMEN PELVIS W CONTRAST  Result Date: 09/05/2021 CLINICAL DATA:  Generalized abdominal pain, vomiting. Bowel obstruction suspected EXAM: CT ABDOMEN AND PELVIS WITH CONTRAST TECHNIQUE: Multidetector CT imaging of the abdomen and pelvis was performed using the standard protocol following bolus administration of intravenous contrast. RADIATION DOSE REDUCTION: This exam was performed according to the departmental dose-optimization program which includes automated exposure control, adjustment of the mA and/or kV according to patient size and/or use of iterative reconstruction technique. CONTRAST:  183m OMNIPAQUE IOHEXOL 300 MG/ML  SOLN COMPARISON:  06/07/2021 FINDINGS: Lower chest: No acute abnormality Hepatobiliary: No focal hepatic abnormality. Gallbladder unremarkable.  Pancreas: No focal abnormality or ductal dilatation. Spleen: No focal abnormality.  Normal size. Adrenals/Urinary Tract: No adrenal abnormality. No focal renal abnormality. No stones or hydronephrosis. Urinary bladder is unremarkable. Stomach/Bowel: Changes of subtotal colectomy. Significantly dilated small bowel loops with scattered air-fluid levels. Loops measure up to 4.3 cm. Transition is at the ileosigmoid anastomosis in the upper pelvis, similar to prior study. Vascular/Lymphatic: No evidence of aneurysm or adenopathy. Reproductive: Fibroid uterus.  No adnexal mass. Other: No free fluid or free air. Musculoskeletal: No acute bony abnormality. IMPRESSION: Recurrent small-bowel obstruction at the ileosigmoid anastomosis in the upper pelvis. Appearance is similar to prior study. Electronically Signed   By: KRolm BaptiseM.D.   On: 09/05/2021 20:28   DG Abd Portable 1V  Result Date: 09/06/2021 CLINICAL DATA:  Small-bowel obstruction EXAM: PORTABLE ABDOMEN - 1 VIEW COMPARISON:  Portable exam 1033 hours compared to 06/12/2021 FINDINGS: Persistent gaseous distension of small bowel loops in the abdomen. Largest small bowel loop measures 6.7 cm diameter, increased. Excreted contrast in bladder. Probable medication tablet projects over stomach. No bowel wall thickening. Nasogastric tube no longer identified. IMPRESSION: Persistent small bowel obstruction, with increased small bowel dilatation since previous exam. Electronically Signed   By: MLavonia DanaM.D.   On: 09/06/2021 10:50    Scheduled Meds:  enoxaparin (LOVENOX) injection  40 mg Subcutaneous Q24H   mesalamine  2.4 g Oral BID WC   methylPREDNISolone (SOLU-MEDROL) injection  40 mg Intravenous Daily   potassium chloride  10 mEq Oral Daily    Continuous Infusions:    sodium chloride 125 mL/hr at 09/06/21 1200     LOS: 0 days     Vernell Leep, MD,  FACP, [facility], [facility], [facility] (Care Management Physician Certified) [place]  To contact the attending provider between 7A-7P or the covering provider during after hours 7P-7A, please log into the web site [email or web address] and access using universal [place] password for that web site. If you do not have the password, please call the hospital operator.  09/06/2021, 2:56 PM

## 2021-09-06 NOTE — Consult Note (Addendum)
Consultation  Referring Provider:  [facility]/ [facility]  Primary Care Physician:  Pcp, No Primary Gastroenterologist:  Dr.Gessner  Reason for Consultation: Recurrent small bowel obstruction  HPI: [name] is a 46 y.o. female, known to Dr. Carlean Purl with history of Crohn's ileocolitis, status post subtotal colectomy who has had 4 admissions within the past year with recurrent small bowel obstructions.  Last admission was February 2023, and at that time had abrupt transition at the site of the ileocolonic anastomosis.  It was decided to treat her with steroids at that time, with improvement in symptoms.  She was last seen in the office in early April 2023 per Dr. Carlean Purl, at that time feeling better, was on prednisone 20 mg p.o. daily and planning to taper as she was to undergo bilateral breast implants. She last had sigmoidoscopy in October 2022 with slight inflammation in the rectum and in the ileum, there was no obstruction or evidence of small bowel obstruction or significant inflammation.  Biopsies did show focal erosions in the ileum consistent with active ileitis and rectal biopsies showed altered crypt architecture and reactive lymphoid aggregates consistent with chronic colitis. She had not been on any chronic maintenance medication recently for Crohn's, other than low dose Lialda(had been on 2 per day decreased to one daily about a week ago)  She was diagnosed with breast cancer in February 2022, stage IIIb, underwent chemotherapy and then bilateral mastectomies August 2022, has residual microinvasive cancer and is status post neoadjuvant chemotherapy.  She had been on Herceptin which was stopped because of concern for possible inflammatory reaction causing these bowel obstructions(stopped 3/23) Patient readmitted yesterday with acute recurrence of abdominal pain, nausea and vomiting onset early yesterday.  She had been feeling well over the past month and a half, with no complaints of  abdominal pain or cramping, having her usual 2-3 loose bowel movements per day.  Abrupt onset of symptoms yesterday, did not have a bowel movement on Sunday . CT of the abdomen and pelvis showed multiple dilated loops of small bowel up to 4.3 cm with transition at the ileosigmoid anastomosis.  Labs 29 2023 showed WBC of 6.6/hemoglobin 14 hematocrit 42 BUN 22/creatinine 0.96 T. bili 2.0 LFTs within normal limits lactate 1.3.  She has been evaluated by surgery, planning to continue conservative management at present Dr. Marcello Moores planning possible elective surgery due to multiple recurrences of small bowel obstructions. She did not have NG placed yesterday, has not had any vomiting, she says she feels better though did take pain medication this morning, she has not been passing gas, no bowel movement.  Surgery is planning small bowel protocol imaging to start today  Abdominal films from this morning show persistent small bowel obstruction with largest small bowel loop measuring 6.7 cm no bowel wall thickening noted   Past Medical History:  Diagnosis Date   Cancer ([place])    Colon stricture ([place]) 05/07/2019   Crohn's colitis, with intestinal obstruction ([place]) 09/17/2013   Dx 2006 - right and left colon involved 2011 - pan-colitis 09/17/2013 left colitis 60-20 cm March 2021 subtotal colectomy ileosigmoid anastomosis for colonic strictures-Dr. Marcello Moores     Crohn's disease [facility]    Family history of breast cancer 06/11/2020   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 10/17/2010   Rectal bleeding    Uterine fibroid    Vitamin D deficiency 09/24/2013    Past Surgical History:  Procedure Laterality Date   AXILLARY LYMPH NODE DISSECTION  Right 11/09/2020   Procedure: RIGHT AXILLARY LYMPH NODE DISSECTION;  Surgeon: [name], MD;  Location: [place];  Service: General;  Laterality: Right;   BIOPSY  07/01/2020   Procedure: BIOPSY;  Surgeon:  Yetta Flock, MD;  Location: [facility];  Service: Gastroenterology;;   BIOPSY  01/27/2021   Procedure: BIOPSY;  Surgeon: Yetta Flock, MD;  Location: [facility];  Service: Gastroenterology;;   BREAST RECONSTRUCTION WITH PLACEMENT OF TISSUE EXPANDER AND FLEX HD (ACELLULAR HYDRATED DERMIS) Bilateral 11/09/2020   Procedure: BILATERAL BREAST RECONSTRUCTION WITH PLACEMENT OF TISSUE EXPANDER AND FLEX HD (ACELLULAR HYDRATED DERMIS);  Surgeon: [name], MD;  Location: [place];  Service: Plastics;  Laterality: Bilateral;   CAPSULOTOMY Bilateral 07/25/2021   Procedure: CAPSULOTOMY;  Surgeon: [name], MD;  Location: [place];  Service: Plastics;  Laterality: Bilateral;   COLECTOMY  06/27/2019   COLONOSCOPY  2015   FLEXIBLE SIGMOIDOSCOPY N/A 07/01/2020   Procedure: FLEXIBLE SIGMOIDOSCOPY;  Surgeon: Yetta Flock, MD;  Location: [facility];  Service: Gastroenterology;  Laterality: N/A;   FLEXIBLE SIGMOIDOSCOPY N/A 01/27/2021   Procedure: FLEXIBLE SIGMOIDOSCOPY;  Surgeon: Yetta Flock, MD;  Location: [facility];  Service: Gastroenterology;  Laterality: N/A;   NIPPLE SPARING MASTECTOMY Bilateral 11/09/2020   Procedure: BILATERAL NIPPLE SPARING MASTECTOMY;  Surgeon: [name], MD;  Location: [place];  Service: General;  Laterality: Bilateral;   PORT-A-CATH REMOVAL N/A 07/25/2021   Procedure: REMOVAL PORT-A-CATH;  Surgeon: [name], MD;  Location: [place];  Service: Plastics;  Laterality: N/A;   PORTACATH PLACEMENT N/A 06/23/2020   Procedure: INSERTION PORT-A-CATH;  Surgeon: [name], MD;  Location: [place];  Service: General;  Laterality: N/A;  START TIME OF 11:00 AM FOR 60 MINUTES [place] [facility]   REMOVAL OF BILATERAL TISSUE EXPANDERS WITH PLACEMENT OF BILATERAL BREAST IMPLANTS Bilateral 07/25/2021   Procedure: REMOVAL OF BILATERAL TISSUE EXPANDERS WITH  PLACEMENT OF BILATERAL BREAST IMPLANTS;  Surgeon: [name], MD;  Location: [place];  Service: Plastics;  Laterality: Bilateral;  1.5    Prior to Admission medications   Medication Sig Start Date End Date Taking? Authorizing Provider  acetaminophen (TYLENOL) 500 MG tablet Take 500-1,000 mg by mouth every 6 (six) hours as needed for mild pain.   Yes [provider]  Ferrous Sulfate (IRON PO) Take 1 tablet by mouth daily.   Yes [provider]  mesalamine (LIALDA) 1.2 g EC tablet Take 2 tablets (2.4 g total) by mouth 2 (two) times daily with a meal. 07/22/20  Yes [name], MD  Multiple Vitamins-Minerals (MULTIVITAMIN WITH MINERALS) tablet Take 1 tablet by mouth daily.   Yes [provider]  potassium chloride (KLOR-CON M) 10 MEQ tablet Take 10 mEq by mouth daily.   Yes [provider]  vitamin B-12 (CYANOCOBALAMIN) 100 MCG tablet Take 100 mcg by mouth daily.   Yes [provider]  prochlorperazine (COMPAZINE) 10 MG tablet Take 1 tablet (10 mg total) by mouth every 6 (six) hours as needed (Nausea or vomiting). 06/16/20 11/19/20  Benay Pike, MD    Current Facility-Administered Medications  Medication Dose Route Frequency Provider Last Rate Last Admin   0.9 %  sodium chloride infusion   Intravenous Continuous Carlisle Cater, PA-C 125 mL/hr at 09/06/21 0615 New Bag at 09/06/21 0615   acetaminophen (TYLENOL) tablet 650 mg  650 mg Oral Q6H PRN Etta Quill, DO  650 mg at 09/05/21 2215   Or   acetaminophen (TYLENOL) suppository 650 mg  650 mg Rectal Q6H PRN Etta Quill, DO       diatrizoate meglumine-sodium (GASTROGRAFIN) 66-10 % solution 90 mL  90 mL Oral Once Jillyn Ledger, PA-C       enoxaparin (LOVENOX) injection 40 mg  40 mg Subcutaneous Q24H Alcario Drought, Jared M, DO       mesalamine (LIALDA) EC tablet 2.4 g  2.4 g Oral BID WC Jennette Kettle M, DO   2.4 g at 09/06/21 0857   morphine (PF) 2 MG/ML injection 2-4 mg   2-4 mg Intravenous Q4H PRN Etta Quill, DO   2 mg at 09/06/21 0900   ondansetron (ZOFRAN) tablet 4 mg  4 mg Oral Q6H PRN Etta Quill, DO       Or   ondansetron [facility]) injection 4 mg  4 mg Intravenous Q6H PRN Etta Quill, DO       potassium chloride (KLOR-CON M) CR tablet 10 mEq  10 mEq Oral Daily Jennette Kettle M, DO   10 mEq at 09/06/21 7353    Allergies as of 09/05/2021 - Review Complete 09/05/2021  Allergen Reaction Noted   Nsaids Other (See Comments) 05/27/2021   Other  06/23/2019    Family History  Problem Relation Age of Onset   Diabetes Father    Breast cancer Maternal Aunt        dx unknown age   Diabetes Paternal 42    Breast cancer Cousin        maternal cousin; dx unknown age   Esophageal cancer Neg Hx    Rectal cancer Neg Hx    Stomach cancer Neg Hx    Colon polyps Neg Hx    Colon cancer Neg Hx     Social History   Socioeconomic History   Marital status: Significant Other    Spouse name: Not on file   Number of children: 1   Years of education: 13   Highest education level: Not on file  Occupational History   Occupation: Medical Coder   Tobacco Use   Smoking status: Former    Types: Cigarettes   Smokeless tobacco: Never  Vaping Use   Vaping Use: Never used  Substance and Sexual Activity   Alcohol use: Yes    Alcohol/week: 1.0 - 2.0 standard drink    Types: 1 - 2 Glasses of wine per week   Drug use: No   Sexual activity: Yes    Partners: Male    Comment: Intermittent protection not consistent  Other Topics Concern   Not on file  Social History Narrative   Single, 1 daughter born 2001 approximately   Medical coder for [employer]   She is a former smoker, occasional alcohol no drug use   Social Determinants of Radio broadcast assistant Strain: Not on file  Food Insecurity: Not on file  Transportation Needs: Not on file  Physical Activity: Not on file  Stress: Not on file  Social Connections: Not on file  Intimate Partner  Violence: Not At Risk   Fear of Current or Ex-Partner: No   Emotionally Abused: No   Physically Abused: No   Sexually Abused: No    Review of Systems: Pertinent positive and negative review of systems were noted in the above HPI section.  All other review of systems was otherwise negative.   Physical Exam: Vital signs in last 24 hours: Temp:  [97.8  F (36.6 C)-99.5 F (37.5 C)] 97.8 F (36.6 C) (05/30 0937) Pulse Rate:  [95-151] 95 (05/30 0937) Resp:  [16-24] 18 (05/30 0937) BP: (114-141)/(80-98) 128/87 (05/30 0937) SpO2:  [99 %-100 %] 99 % (05/30 0937) Weight:  [63.5 kg] 63.5 kg (05/29 1844) Last BM Date : 09/04/21 General:   Alert,  Well-developed, well-nourished, African-American female ,pleasant and cooperative in NAD Head:  Normocephalic and atraumatic. Eyes:  Sclera clear, no icterus.   Conjunctiva pink. Ears:  Normal auditory acuity. Nose:  No deformity, discharge,  or lesions. Mouth:  No deformity or lesions.   Neck:  Supple; no masses or thyromegaly. Lungs:  Clear throughout to auscultation.   No wheezes, crackles, or rhonchi.  Heart:  Regular rate and rhythm; no murmurs, clicks, rubs,  or gallops. Abdomen:  Soft, no focal tenderness, bowel sounds are quiet-midline incisional scar  Rectal: Not done Msk:  Symmetrical without gross deformities. . Pulses:  Normal pulses noted. Extremities:  Without clubbing or edema. Neurologic:  Alert and  oriented x4;  grossly normal neurologically. Skin:  Intact without significant lesions or rashes.. Psych:  Alert and cooperative. Normal mood and affect.  Intake/Output from previous day: 05/29 0701 - 05/30 0700 In: 2486.5 [P.O.:60; I.V.:1010.3; IV Piggyback:1416.3] Out: -  Intake/Output this shift: No intake/output data recorded.  Lab Results: Recent Labs    09/05/21 1843 09/06/21 0413  WBC 6.6 2.8*  HGB 14.1 11.7*  HCT 42.2 36.2  PLT 334 244   BMET Recent Labs    09/05/21 1843 09/06/21 0413  NA 138 138  K 3.6  3.7  CL 100 106  CO2 24 23  GLUCOSE 122* 103*  BUN 22* 19  CREATININE 0.96 0.87  CALCIUM 9.6 8.5*   LFT Recent Labs    09/05/21 1843  PROT 8.7*  ALBUMIN 4.3  AST 23  ALT 20  ALKPHOS 113  BILITOT 2.0*   PT/INR No results for input(s): LABPROT, INR in the last 72 hours. Hepatitis Panel No results for input(s): HEPBSAG, HCVAB, HEPAIGM, HEPBIGM in the last 72 hours.     IMPRESSION:  #59  46 year old African-American female with history of Crohn's ileocolitis, status post subtotal colectomy 2021, who was admitted yesterday with fourth small bowel obstruction in less than a year. Each time patient has presented with abrupt onset of symptoms, and says that she feels fine in between these episodes with no active abdominal pain, changes in bowel habits cramping etc.  CT on admission yesterday similar to that from February 2023 admission showing small bowel obstruction with abrupt transition point at the ilio sigmoid anastomosis  She has not had any further nausea or vomiting, but has not yet started passing any flatus.  Abdominal films today show persistent small bowel obstruction.  Unclear at this point if her obstructions are secondary to adhesions which seems more likely, versus active Crohn's at the anastomosis.  She did have flexible sigmoidoscopy October 2022 during admission for same, had mild inflammation noted in the ileum, no obstruction or stricture noted and there was also slight inflammation in the rectum.  Biopsies did show focal erosions in the ileum with active inflammation consistent with Crohn's and had changes of chronic colitis from the rectal biopsies.  Patient had been on low-dose Lialda, no other maintenance treatment for Crohn's over the past couple of years.  She did take a course of prednisone after the admission in February 2023.  #2 breast cancer right, diagnosed February 2022 stage IIIb, she completed chemotherapy, then bilateral  mastectomies August 2022,  then neoadjuvant treatment.  Off Herceptin since March 2023 She had breast implant surgery April 2023.  Plan; n.p.o. except ice chips and sips Agree with small bowel protocol imaging for persistent small bowel obstruction Discussed indication for NG placement if she has any vomiting.  We will check sed rate, CRP and fecal calprotectin  I think it would be helpful to do another flex sigmoid/ileoscopy this admission, hopefully after she has opened up. Unless significant active Crohn's found on endoscopic evaluation, she would benefit from laparoscopy and lysis of adhesions but is understandably reluctant to undergo any further surgery.  GI will follow with you.    Amy Esterwood PA-C 09/06/2021, 11:03 AM     Attending physician's note   I have taken history, reviewed the chart and examined the patient. I performed a substantive portion of this encounter, including complete performance of at least one of the key components, in conjunction with the APP. I agree with the Advanced Practitioner's note, impression and recommendations.   46yrold AAF with H/O Crohn's ileocolitis s/p subtotal colectomy 2021, now with recurrent SBO (4th episode in about a year) with CT showing abrupt transition at ileosigmoid anastomosis (adhesions vs active Crohn's). ESR 26 this adm. Last FS 01/2021 did show mild inflammation in neo-TI, confirmed by Bx. Patent anastomosis.  Also with H/O R Breast Adenoca s/p B/L mastectomy followed by breast implant surgery 07/2021. Off Perjeta d/t concerns regarding GI toxicity (mucositis/colitis)  Plan: -Follow CRP/fecal calprotectin -IV Solu-Medrol 40 Q24hrs -Appreciate Sx consultation. SB protocol in progress. NG if N/V -If she improves, plan for flexible sigmoidoscopy 6/1. -Dr. GCarlean Purlhad discussed regarding Biologics in past.  She had refused d/t S/Es.  Now willing to reconsider if these would help long-term. Will order TB gold/HBsAg near D/C. -Will resume BID Lialda once  able to take p.o. (near D/C)   RCarmell Austria MD LVelora HecklerGI [phone]

## 2021-09-06 NOTE — Progress Notes (Signed)
Transition of Care [facility]) Screening Note  Patient Details  Name: [name] Date of Birth: October 29, 1975  Transition of Care [facility]) CM/SW Contact:    Sherie Don, LCSW Phone Number: 09/06/2021, 11:37 AM  Transition of Care Department [facility]) has reviewed patient and no TOC needs have been identified at this time. We will continue to monitor patient advancement through interdisciplinary progression rounds. If new patient transition needs arise, please place a TOC consult.

## 2021-09-07 ENCOUNTER — Inpatient Hospital Stay (HOSPITAL_COMMUNITY): Payer: Managed Care, Other (non HMO) | Admitting: Anesthesiology

## 2021-09-07 ENCOUNTER — Encounter (HOSPITAL_COMMUNITY)
Admission: EM | Disposition: A | Discharge status: Home / Self Care | Payer: Self-pay | Source: Home / Self Care | Attending: Internal Medicine

## 2021-09-07 ENCOUNTER — Inpatient Hospital Stay (HOSPITAL_COMMUNITY): Payer: Managed Care, Other (non HMO)

## 2021-09-07 ENCOUNTER — Encounter (HOSPITAL_COMMUNITY): Payer: Self-pay | Admitting: Internal Medicine

## 2021-09-07 DIAGNOSIS — K6289 Other specified diseases of anus and rectum: Secondary | ICD-10-CM

## 2021-09-07 DIAGNOSIS — Z980 Intestinal bypass and anastomosis status: Secondary | ICD-10-CM

## 2021-09-07 DIAGNOSIS — K50819 Crohn's disease of both small and large intestine with unspecified complications: Secondary | ICD-10-CM | POA: Diagnosis not present

## 2021-09-07 DIAGNOSIS — D649 Anemia, unspecified: Secondary | ICD-10-CM

## 2021-09-07 DIAGNOSIS — K56609 Unspecified intestinal obstruction, unspecified as to partial versus complete obstruction: Secondary | ICD-10-CM | POA: Diagnosis not present

## 2021-09-07 HISTORY — PX: FLEXIBLE SIGMOIDOSCOPY: SHX5431

## 2021-09-07 HISTORY — PX: BIOPSY: SHX5522

## 2021-09-07 LAB — CBC
HCT: 33.9 % — ABNORMAL LOW (ref 36.0–46.0)
Hemoglobin: 10.9 g/dL — ABNORMAL LOW (ref 12.0–15.0)
MCH: 27.7 pg (ref 26.0–34.0)
MCHC: 32.2 g/dL (ref 30.0–36.0)
MCV: 86 fL (ref 80.0–100.0)
Platelets: 224 10*3/uL (ref 150–400)
RBC: 3.94 MIL/uL (ref 3.87–5.11)
RDW: 14.1 % (ref 11.5–15.5)
WBC: 5.1 10*3/uL (ref 4.0–10.5)
nRBC: 0 % (ref 0.0–0.2)

## 2021-09-07 LAB — COMPREHENSIVE METABOLIC PANEL
ALT: 16 U/L (ref 0–44)
AST: 19 U/L (ref 15–41)
Albumin: 3.4 g/dL — ABNORMAL LOW (ref 3.5–5.0)
Alkaline Phosphatase: 76 U/L (ref 38–126)
Anion gap: 15 (ref 5–15)
BUN: 13 mg/dL (ref 6–20)
CO2: 19 mmol/L — ABNORMAL LOW (ref 22–32)
Calcium: 8.5 mg/dL — ABNORMAL LOW (ref 8.9–10.3)
Chloride: 103 mmol/L (ref 98–111)
Creatinine, Ser: 0.59 mg/dL (ref 0.44–1.00)
GFR, Estimated: >60 mL/min (ref >60)
Glucose, Bld: 83 mg/dL (ref 70–99)
Potassium: 3.9 mmol/L (ref 3.5–5.1)
Sodium: 137 mmol/L (ref 135–145)
Total Bilirubin: 1.3 mg/dL — ABNORMAL HIGH (ref 0.3–1.2)
Total Protein: 6.6 g/dL (ref 6.5–8.1)

## 2021-09-07 LAB — HEPATITIS PANEL, ACUTE
HCV Ab: NONREACTIVE
Hep A IgM: NONREACTIVE
Hep B C IgM: NONREACTIVE
Hepatitis B Surface Ag: NONREACTIVE

## 2021-09-07 SURGERY — SIGMOIDOSCOPY, FLEXIBLE
Anesthesia: Monitor Anesthesia Care

## 2021-09-07 MED ORDER — FLEET ENEMA 7-19 GM/118ML RE ENEM
2.0000 | ENEMA | Freq: Once | RECTAL | Status: AC
  Administered 2021-09-07: 2 via RECTAL
  Filled 2021-09-07: qty 2

## 2021-09-07 MED ORDER — PROPOFOL 10 MG/ML IV BOLUS
INTRAVENOUS | Status: DC | PRN
  Administered 2021-09-07 (×3): 50 mg via INTRAVENOUS

## 2021-09-07 MED ORDER — LACTATED RINGERS IV SOLN: INTRAVENOUS | Status: DC

## 2021-09-07 NOTE — Anesthesia Preprocedure Evaluation (Addendum)
Anesthesia Evaluation  Patient identified by MRN, date of birth, ID band Patient awake  General Assessment [name] is a 46 y.o. female with medical history significant of Crohn's disease, breast CA s/p B mastectomies, chemo and radiation.  Pt with h/o prior SBOs  Pt with h/o subtotal colectomy with ileosigmoid anastomosis for colonic strictures.  Pt with h/o SBO at site of anastomosis in recent months.  4 recent admits for SBO: Oct 22, and 2 in Feb 23.   Reviewed: Allergy & Precautions, NPO status , Patient's Chart, lab work & pertinent test results  Airway Mallampati: II  TM Distance: >3 FB Neck ROM: Full    Dental no notable dental hx. (+) Teeth Intact, Dental Advisory Given   Pulmonary neg pulmonary ROS, former smoker,    Pulmonary exam normal breath sounds clear to auscultation       Cardiovascular negative cardio ROS Normal cardiovascular exam Rhythm:Regular Rate:Normal     Neuro/Psych negative neurological ROS  negative psych ROS   GI/Hepatic negative GI ROS, Neg liver ROS,   Endo/Other  negative endocrine ROS  Renal/GU negative Renal ROS  negative genitourinary   Musculoskeletal negative musculoskeletal ROS (+)   Abdominal   Peds negative pediatric ROS (+)  Hematology  (+) Blood dyscrasia, anemia ,   Anesthesia Other Findings NSAIDs  Reproductive/Obstetrics negative OB ROS                            Anesthesia Physical Anesthesia Plan  ASA: 3  Anesthesia Plan: MAC   Post-op Pain Management: Minimal or no pain anticipated   Induction: Intravenous  PONV Risk Score and Plan: 2 and Propofol infusion and Treatment may vary due to age or medical condition  Airway Management Planned: Simple Face Mask  Additional Equipment:   Intra-op Plan:   Post-operative Plan:   Informed Consent: I have reviewed the patients History and Physical, chart, labs and  discussed the procedure including the risks, benefits and alternatives for the proposed anesthesia with the patient or authorized representative who has indicated his/her understanding and acceptance.     Dental advisory given  Plan Discussed with: CRNA and Surgeon  Anesthesia Plan Comments:         Anesthesia Quick Evaluation

## 2021-09-07 NOTE — Progress Notes (Signed)
Subjective: CC: Patient reports she had a liquidy BM and flatus overnight.  Since that time her abdominal pain has resolved.  No current abdominal pain.  Feels less distended.  No nausea or vomiting.  Objective: Vital signs in last 24 hours: Temp:  [97.8 F (36.6 C)-98.6 F (37 C)] 98.6 F (37 C) (05/31 2595) Pulse Rate:  [95-101] 95 (05/31 0613) Resp:  [16-18] 18 (05/31 0613) BP: (122-129)/(84-87) 129/85 (05/31 6387) SpO2:  [98 %-100 %] 100 % (05/31 0613) Last BM Date : 09/04/21  Intake/Output from previous day: 05/30 0701 - 05/31 0700 In: 2972.8 [I.V.:2972.8] Out: 2200 [Urine:2200] Intake/Output this shift: No intake/output data recorded.  PE: Gen:  Alert, NAD, pleasant Pulm: Normal rate and effort  Abd: Mild distension that is improved. Abdomen remains soft, completely NT, with +bs Psych: A&Ox3   Lab Results:  Recent Labs    09/06/21 0413 09/07/21 0455  WBC 2.8* 5.1  HGB 11.7* 10.9*  HCT 36.2 33.9*  PLT 244 224   BMET Recent Labs    09/06/21 0413 09/07/21 0455  NA 138 137  K 3.7 3.9  CL 106 103  CO2 23 19*  GLUCOSE 103* 83  BUN 19 13  CREATININE 0.87 0.59  CALCIUM 8.5* 8.5*   PT/INR No results for input(s): LABPROT, INR in the last 72 hours. CMP     Component Value Date/Time   NA 137 09/07/2021 0455   NA 135 01/21/2020 1056   K 3.9 09/07/2021 0455   CL 103 09/07/2021 0455   CO2 19 (L) 09/07/2021 0455   GLUCOSE 83 09/07/2021 0455   BUN 13 09/07/2021 0455   BUN 12 01/21/2020 1056   CREATININE 0.59 09/07/2021 0455   CREATININE 1.17 (H) 07/15/2021 1016   CALCIUM 8.5 (L) 09/07/2021 0455   PROT 6.6 09/07/2021 0455   ALBUMIN 3.4 (L) 09/07/2021 0455   AST 19 09/07/2021 0455   AST 16 07/15/2021 1016   ALT 16 09/07/2021 0455   ALT 21 07/15/2021 1016   ALKPHOS 76 09/07/2021 0455   BILITOT 1.3 (H) 09/07/2021 0455   BILITOT 1.1 07/15/2021 1016   GFRNONAA >60 09/07/2021 0455   GFRNONAA 59 (L) 07/15/2021 1016   GFRAA 108 01/21/2020 1056    Lipase     Component Value Date/Time   LIPASE 26 09/05/2021 1843    Studies/Results: CT ABDOMEN PELVIS W CONTRAST  Result Date: 09/05/2021 CLINICAL DATA:  Generalized abdominal pain, vomiting. Bowel obstruction suspected EXAM: CT ABDOMEN AND PELVIS WITH CONTRAST TECHNIQUE: Multidetector CT imaging of the abdomen and pelvis was performed using the standard protocol following bolus administration of intravenous contrast. RADIATION DOSE REDUCTION: This exam was performed according to the departmental dose-optimization program which includes automated exposure control, adjustment of the mA and/or kV according to patient size and/or use of iterative reconstruction technique. CONTRAST:  157m OMNIPAQUE IOHEXOL 300 MG/ML  SOLN COMPARISON:  06/07/2021 FINDINGS: Lower chest: No acute abnormality Hepatobiliary: No focal hepatic abnormality. Gallbladder unremarkable. Pancreas: No focal abnormality or ductal dilatation. Spleen: No focal abnormality.  Normal size. Adrenals/Urinary Tract: No adrenal abnormality. No focal renal abnormality. No stones or hydronephrosis. Urinary bladder is unremarkable. Stomach/Bowel: Changes of subtotal colectomy. Significantly dilated small bowel loops with scattered air-fluid levels. Loops measure up to 4.3 cm. Transition is at the ileosigmoid anastomosis in the upper pelvis, similar to prior study. Vascular/Lymphatic: No evidence of aneurysm or adenopathy. Reproductive: Fibroid uterus.  No adnexal mass. Other: No free fluid or free air. Musculoskeletal:  No acute bony abnormality. IMPRESSION: Recurrent small-bowel obstruction at the ileosigmoid anastomosis in the upper pelvis. Appearance is similar to prior study. Electronically Signed   By: Rolm Baptise M.D.   On: 09/05/2021 20:28   DG Abd Portable 1V  Result Date: 09/06/2021 CLINICAL DATA:  Small-bowel obstruction EXAM: PORTABLE ABDOMEN - 1 VIEW COMPARISON:  Portable exam 1033 hours compared to 06/12/2021 FINDINGS: Persistent  gaseous distension of small bowel loops in the abdomen. Largest small bowel loop measures 6.7 cm diameter, increased. Excreted contrast in bladder. Probable medication tablet projects over stomach. No bowel wall thickening. Nasogastric tube no longer identified. IMPRESSION: Persistent small bowel obstruction, with increased small bowel dilatation since previous exam. Electronically Signed   By: Lavonia Dana M.D.   On: 09/06/2021 10:50   DG Abd Portable 1V-Small Bowel Obstruction Protocol-initial, 8 hr delay  Result Date: 09/06/2021 CLINICAL DATA:  Small-bowel protocol, 8 hours postcontrast. EXAM: PORTABLE ABDOMEN - 1 VIEW COMPARISON:  Abdominal x-ray 09/06/2021. CT abdomen and pelvis 09/05/2021 FINDINGS: Oral contrast remains in the stomach on this post 8 hour film. Dilated central small bowel loops appear unchanged measuring up to 6 cm. Minimal colonic gas identified. Contrast in the bladder has decreased. No acute fractures. IMPRESSION: 1. Oral contrast remains in the stomach. Dilated small bowel persists, unchanged. Findings are compatible with high-grade small-bowel obstruction. Electronically Signed   By: Ronney Asters M.D.   On: 09/06/2021 20:25    Anti-infectives: Anti-infectives (From admission, onward)    None        Assessment/Plan Recurrent SBO - 4th admit for sbo in the last year Hx Crohn's s/p robotic assisted subtotal colectomy with ileo-sigmoid anastomosis 06/27/19 by Dr. Marcello Moores.  On maintenance Mesalamine - CT A/P w/ sbo at the ileosigmoid anastomosis.  - Patient w/ ROBF. Symptomatically improved and NT on exam. Xray pending. Can start on clears. Will f/u on film - Patient had flex sig in 10/22 that showed a patient ileo-sigmoid surgical anastomosis that was able to be transversed and showed some disease of the ileum. GI planning repeat scope tomorrow to evaluate if there is any stricturing or disease at area of anastomosis/obstruction - IV Solu-Medrol per GI.  They plan to resume  meds only when taking p.o.  They are also evaluating starting Biologics - Hopefully she will improve with conservative management. If is not improving with conservative management she may need exploratory surgery during this admission - If patient improves with conservative management we discussed following up in the office with Dr. Marcello Moores to discuss possible elective surgery given this is her fourth admission for SBO in the last year.  Scope by GI will be helpful to evaluate if there is any stricturing.  Again she would like to avoid any surgery if it at all possible. Can follow up PRN.    FEN: CLD, IVF VTE: SCDs, Lovenox  ID: None   LOS: 1 day    Jillyn Ledger , [facility] Surgery 09/07/2021, 8:33 AM Please see Amion for pager number during day hours 7:00am-4:30pm

## 2021-09-07 NOTE — Op Note (Signed)
[facility] Patient Name: [name] Procedure Date: 09/07/2021 MRN: [id number] Attending MD: Jackquline Denmark , MD Date of Birth: [birth year] CSN: [id number] Age: 46 Admit Type: Inpatient Procedure:                Flexible Sigmoidoscopy Indications:              H/O Crohn's ileocolitis s/p subtotal colectomy                            2021, now with recurrent SBO (4th episode in about                            a year) with CT showing abrupt transition at                            ileosigmoid anastomosis. Providers:                Jackquline Denmark, MD, Doristine Johns, RN, Luan Moore, Technician Referring MD:             Dr Carlean Purl. Medicines:                Monitored Anesthesia Care Complications:            No immediate complications. Estimated Blood Loss:     Estimated blood loss was minimal. Procedure:                Pre-Anesthesia Assessment:                           - Prior to the procedure, a History and Physical                            was performed, and patient medications and                            allergies were reviewed. The patient's tolerance of                            previous anesthesia was also reviewed. The risks                            and benefits of the procedure and the sedation                            options and risks were discussed with the patient.                            All questions were answered, and informed consent                            was obtained. Prior Anticoagulants: The patient has  taken no previous anticoagulant or antiplatelet                            agents. ASA Grade Assessment: II - A patient with                            mild systemic disease. After reviewing the risks                            and benefits, the patient was deemed in                            satisfactory condition to undergo the procedure.                           After  obtaining informed consent, the scope was                            passed under direct vision. The PCF-HQ190L                            ([id number]) Olympus colonoscope was introduced                            through the anus and advanced to the the                            ileo-sigmoid anastomosis. Thereafter approximately                            10 cm of distal ileum was intubated. The flexible                            sigmoidoscopy was accomplished without difficulty.                            The patient tolerated the procedure well. The                            quality of the bowel preparation was adequate to                            identify polyps. There was retained stool which was                            lavaged. Scope In: 2:59:58 PM Scope Out: 3:16:38 PM Scope Withdrawal Time: 0 hours 0 minutes 1 second  Total Procedure Duration: 0 hours 16 minutes 40 seconds  Findings:      The perianal and digital rectal examinations were normal except for       internal hemorrhoids. No perianal fistulas or sinuses.      There was evidence of a prior side-to-side ileo-colonic anastomosis in       the sigmoid colon, 25 cm from the anal verge. This was characterized by  edema, erythema, ulcers and moderate stenosis (we were able to pass PCF       H190 with diameter 11.5 mm). Biopsies were taken with a cold forceps for       histology. Approximately 10 cm of distal ileum was traversed and was       normal.      Localized moderate inflammation characterized by erythema, friability,       linear erosions and loss of vascularity was found in the distal rectum       up to 2 cm from the dentate line. Biopsies were taken with a cold       forceps for histology. The remaining rectal mucosa and sigmoid mucosa       was normal with well preserved vascular pattern s/o skip area of       involvement. Impression:               - Anastomotic stenosis with ulcers and edema s/o                             recurrence of Crohn's disease. Biopsied.                           - Crohn's proctitis- biopsied. Moderate Sedation:      Not Applicable - Patient had care per Anesthesia. Recommendation:           - Return patient to hospital ward for ongoing care.                           - Clear liquid diet. Advance as tolerated.                           - Would continue IV steroids x another 24 hours.                            Then if no recurrent signs of SBO, transition to                            prednisone 40 mg p.o. QD x 1 week, 34m po QD x 1                            week, continue 20 mg po qd until FU visit                           - Await pathology results.                           - Check TB Gold, hepatitis B surface antigen in                            anticipation of biologic therapy.                           - Can continue mesalamine twice daily                           -  FU GI in 2-3 weeks. Will discuss with Dr Carlean Purl.                           - The findings and recommendations were discussed                            with the patient. Procedure Code(s):        --- Professional ---                           [phone], Sigmoidoscopy, flexible; with biopsy, single                            or multiple Diagnosis Code(s):        --- Professional ---                           Z98.0, Intestinal bypass and anastomosis status                           K62.89, Other specified diseases of anus and rectum                           R93.3, Abnormal findings on diagnostic imaging of                            other parts of digestive tract CPT copyright 2019 [employer]. All rights reserved. The codes documented in this report are preliminary and upon coder review may  be revised to meet current compliance requirements. Jackquline Denmark, MD 09/07/2021 3:48:30 PM This report has been signed electronically. Number of Addenda: 0

## 2021-09-07 NOTE — Transfer of Care (Signed)
Immediate Anesthesia Transfer of Care Note  Patient: [name]  Procedure(s) Performed: FLEXIBLE SIGMOIDOSCOPY BIOPSY  Patient Location: PACU  Anesthesia Type:MAC  Level of Consciousness: awake, alert , oriented and patient cooperative  Airway & Oxygen Therapy: Patient Spontanous Breathing  Post-op Assessment: Report given to RN, Post -op Vital signs reviewed and stable and Patient moving all extremities X 4  Post vital signs: Reviewed and stable  Last Vitals:  Vitals Value Taken Time  BP 123/74 09/07/21 1520  Temp    Pulse    Resp 20 09/07/21 1520  SpO2    Vitals shown include unvalidated device data.  Last Pain:  Vitals:   09/07/21 1424  TempSrc: Temporal  PainSc: 0-No pain      Patients Stated Pain Goal: 2 (27/51/70 0174)  Complications: No notable events documented.

## 2021-09-07 NOTE — Progress Notes (Addendum)
Patient ID: [name], female   DOB: 01/06/1976, 46 y.o.   MRN: [id number]    Progress Note   Subjective   Day # 2 CC; recurrent SBO  IV Solu-Medrol  Sed rate 26 CRP 5.3 Fecal calprotectin pending WBC 5.1/hemoglobin 10.9/hematocrit 33.9  KUB this am-persistent distention of small bowel throughout the abdomen there is some enteric contrast over the rectal vault, now suggesting partial obstruction  Patient says she feels better, has had 2 bowel movements earlier this morning and passed gas, now anxious to be discharged from the hospital, frustrated by repeated obstructions   Objective   Vital signs in last 24 hours: Temp:  [97.8 F (36.6 C)-98.6 F (37 C)] 98.6 F (37 C) (05/31 2500) Pulse Rate:  [95-101] 95 (05/31 0613) Resp:  [16-18] 18 (05/31 0613) BP: (122-129)/(84-87) 129/85 (05/31 3704) SpO2:  [98 %-100 %] 100 % (05/31 8889) Last BM Date : 09/04/21 General:    African-American female in NAD Heart:  Regular rate and rhythm; no murmurs Lungs: Respirations even and unlabored, lungs CTA bilaterally Abdomen:  Soft, nontender , midline incisional scar, bowel sounds somewhat hyperactive, no focal tenderness Extremities:  Without edema. Neurologic:  Alert and oriented,  grossly normal neurologically. Psych:  Cooperative. Normal mood and affect.  Intake/Output from previous day: 05/30 0701 - 05/31 0700 In: 2972.8 [I.V.:2972.8] Out: 2200 [Urine:2200] Intake/Output this shift: No intake/output data recorded.  Lab Results: Recent Labs    09/05/21 1843 09/06/21 0413 09/07/21 0455  WBC 6.6 2.8* 5.1  HGB 14.1 11.7* 10.9*  HCT 42.2 36.2 33.9*  PLT 334 244 224   BMET Recent Labs    09/05/21 1843 09/06/21 0413 09/07/21 0455  NA 138 138 137  K 3.6 3.7 3.9  CL 100 106 103  CO2 24 23 19*  GLUCOSE 122* 103* 83  BUN 22* 19 13  CREATININE 0.96 0.87 0.59  CALCIUM 9.6 8.5* 8.5*   LFT Recent Labs    09/07/21 0455  PROT 6.6  ALBUMIN 3.4*  AST 19  ALT 16   ALKPHOS 76  BILITOT 1.3*   PT/INR No results for input(s): LABPROT, INR in the last 72 hours.  Studies/Results: CT ABDOMEN PELVIS W CONTRAST  Result Date: 09/05/2021 CLINICAL DATA:  Generalized abdominal pain, vomiting. Bowel obstruction suspected EXAM: CT ABDOMEN AND PELVIS WITH CONTRAST TECHNIQUE: Multidetector CT imaging of the abdomen and pelvis was performed using the standard protocol following bolus administration of intravenous contrast. RADIATION DOSE REDUCTION: This exam was performed according to the departmental dose-optimization program which includes automated exposure control, adjustment of the mA and/or kV according to patient size and/or use of iterative reconstruction technique. CONTRAST:  119m OMNIPAQUE IOHEXOL 300 MG/ML  SOLN COMPARISON:  06/07/2021 FINDINGS: Lower chest: No acute abnormality Hepatobiliary: No focal hepatic abnormality. Gallbladder unremarkable. Pancreas: No focal abnormality or ductal dilatation. Spleen: No focal abnormality.  Normal size. Adrenals/Urinary Tract: No adrenal abnormality. No focal renal abnormality. No stones or hydronephrosis. Urinary bladder is unremarkable. Stomach/Bowel: Changes of subtotal colectomy. Significantly dilated small bowel loops with scattered air-fluid levels. Loops measure up to 4.3 cm. Transition is at the ileosigmoid anastomosis in the upper pelvis, similar to prior study. Vascular/Lymphatic: No evidence of aneurysm or adenopathy. Reproductive: Fibroid uterus.  No adnexal mass. Other: No free fluid or free air. Musculoskeletal: No acute bony abnormality. IMPRESSION: Recurrent small-bowel obstruction at the ileosigmoid anastomosis in the upper pelvis. Appearance is similar to prior study. Electronically Signed   By: KRolm BaptiseM.D.  On: 09/05/2021 20:28   DG Abd Portable 1V  Result Date: 09/07/2021 CLINICAL DATA:  BM x2 overnight. EXAM: PORTABLE ABDOMEN - 1 VIEW COMPARISON:  Multiple priors including most recent abdominal  radiograph Sep 06, 2021. FINDINGS: Radiopaque enteric contrast material projects over the rectal vault. Persistent dilation/distension of loops of small bowel throughout the abdomen in this patient who is status post subtotal colectomy. Calcified uterine leiomyoma. IMPRESSION: Persistent dilation/distension of small bowel throughout the abdomen, but with radiopaque enteric contrast projecting over the rectal vault, most consistent with partial obstruction. Electronically Signed   By: Dahlia Bailiff M.D.   On: 09/07/2021 08:52   DG Abd Portable 1V  Result Date: 09/06/2021 CLINICAL DATA:  Small-bowel obstruction EXAM: PORTABLE ABDOMEN - 1 VIEW COMPARISON:  Portable exam 1033 hours compared to 06/12/2021 FINDINGS: Persistent gaseous distension of small bowel loops in the abdomen. Largest small bowel loop measures 6.7 cm diameter, increased. Excreted contrast in bladder. Probable medication tablet projects over stomach. No bowel wall thickening. Nasogastric tube no longer identified. IMPRESSION: Persistent small bowel obstruction, with increased small bowel dilatation since previous exam. Electronically Signed   By: Lavonia Dana M.D.   On: 09/06/2021 10:50   DG Abd Portable 1V-Small Bowel Obstruction Protocol-initial, 8 hr delay  Result Date: 09/06/2021 CLINICAL DATA:  Small-bowel protocol, 8 hours postcontrast. EXAM: PORTABLE ABDOMEN - 1 VIEW COMPARISON:  Abdominal x-ray 09/06/2021. CT abdomen and pelvis 09/05/2021 FINDINGS: Oral contrast remains in the stomach on this post 8 hour film. Dilated central small bowel loops appear unchanged measuring up to 6 cm. Minimal colonic gas identified. Contrast in the bladder has decreased. No acute fractures. IMPRESSION: 1. Oral contrast remains in the stomach. Dilated small bowel persists, unchanged. Findings are compatible with high-grade small-bowel obstruction. Electronically Signed   By: Ronney Asters M.D.   On: 09/06/2021 20:25       Assessment / Plan:    #21  46 year old African-American female with history of Crohn's ileocolitis status post subtotal colectomy with ileosigmoid anastomosis 2021.  Currently admitted with fourth small bowel obstruction within the past year.  At the last couple of admission she has had abrupt transition at the site of the ileocolonic anastomosis. Flexible sigmoidoscopy October 2022 just showed slight inflammation in the rectum and in the ileum, no obstruction or evidence of significant ileal inflammation.  Biopsies did show focal erosions in the ileum consistent with active ileitis  She has improved since admission, KUB shows contrast in the rectal vault and she has now had 2 bowel movements this morning consistent with resolving small bowel obstruction  She does have an elevated sed rate and CRP. It is not clear whether these recurrent acute obstructions with abrupt onset are secondary to adhesions at her anastomosis versus component of active Crohn's/anastomotic  Started IV Solu-Medrol yesterday  We will proceed with flexible sigmoidoscopy this afternoon per Dr. Lyndel Safe to evaluate the anastomosis and TI. Keep n.p.o. Fleets enemas x2  If no evidence for significantly active Crohn's, then she will benefit from lap with lysis of adhesions, though she is reluctant to have any further surgeries.  #2 breast cancer right diagnosed February 2022 stage IIIb-status post chemotherapy, bilateral mastectomies, neoadjuvant treatment and had breast reconstructive surgery April 2023.  She is not on any current active treatment, Herceptin discontinued       Principal Problem:   SBO (small bowel obstruction) ([place]) Active Problems:   Malignant neoplasm of upper-outer quadrant of right breast in female, estrogen receptor positive ([place])  Crohn's disease of small and large intestines with complication ([place])     LOS: 1 day   Amy Esterwood PA-C 09/07/2021, 8:59 AM     Attending physician's note   I have taken history, reviewed  the chart and examined the patient. I performed a substantive portion of this encounter, including complete performance of at least one of the key components, in conjunction with the APP. I agree with the Advanced Practitioner's note, impression and recommendations.   Responded well to steroids (1 dose so far) Proceed with flexible sigmoidoscopy Still reluctant to go on Biologics. She will discuss with Dr. Carlean Purl as outpt. Continue mesalamine BID Had D/W Dr Carlean Purl earlier.   Carmell Austria, MD [facility] [phone]

## 2021-09-07 NOTE — Progress Notes (Signed)
PROGRESS NOTE   [name]  DJS:[id number]    DOB: [birth year]    DOA: 09/05/2021  PCP: Pcp, No   I have briefly reviewed patients previous medical records in [facility].  Chief Complaint  Patient presents with   Abdominal Pain   Emesis    Brief Narrative:  46 year old female, PMH of Crohn's ileocolitis, s/p subtotal colectomy with ileosigmoid anastomosis, recurrent SBO's, breast cancer s/p bilateral mastectomies, chemoradiation, iron deficiency anemia, vitamin D deficiency, presented with abdominal pain, emesis and admitted for another episode of SBO.  Treating conservatively.  General surgery and [facility] consulted.  Now having BMs, passing flatus but KUB shows PSBO with stool in the rectal vault.  GI plans flexible sigmoidoscopy 5/31 and if no evidence of active Crohn's then they recommend laparotomy with lysis of adhesions although patient reluctant to have any further surgeries.   Assessment & Plan:  Principal Problem:   SBO (small bowel obstruction) ([facility]) Active Problems:   Crohn's disease of small and large intestines with complication ([facility])   Malignant neoplasm of upper-outer quadrant of right breast in female, estrogen receptor positive ([place])   Recurrent SBO:  Reportedly fourth admit for SBO in the last year. CT A/P shows SBO at the ileosigmoid anastomosis. General surgery consultation appreciated.  Treated supportively with SBO protocol.  No NG placed. GI consulted to optimize Crohn's treatment with the hope to minimize the recurrent SBO's.  Obviously if not improving with conservative management, may need surgery this admission. Etiology of her SBO is unclear?  Adhesions versus active Crohn's. Reports that her mesalamine dose was reduced from twice daily to once daily and wonders if the recurrent SBO is related to that [facility] follow-up appreciated.  ESR 26, CRP 5.3, fecal calprotectin pending.  The plan sigmoidoscopy/ileoscopy 5/31 and if no active Crohn's  found then may benefit from laparoscopic and LOA but patient reluctant for more surgeries.  Crohn's ileocolitis [facility] input appreciated. Mesalamine increased to 2.4 g twice daily Treating as Crohn's flare with IV Solu-Medrol 40 mg daily. Rest of evaluation and management as above.  Breast cancer S/p chemotherapy followed by bilateral mastectomies and then neoadjuvant treatment. Follows with Dr. Lindi Adie oncology.  Iron deficiency anemia: Hemoglobin 14.1 on admission has dropped to 11.7 >10.9.  Hemoglobin in the 11 g range possibly her baseline.  Relatively stable. Follow CBC in AM.  Leukopenia WBC has dropped from 6.6-2.8.  Etiology unclear.  Suspect lab error WBC normal.  Body mass index is 24.03 kg/m.   DVT prophylaxis: enoxaparin (LOVENOX) injection 40 mg Start: 09/06/21 1000     Code Status: Full Code:  Family Communication: None at bedside. Disposition:  Status is: Observation The patient will require care spanning > 2 midnights and should be moved to inpatient because: Further evaluation and management of SBO which may need further procedures.     Consultants:   General surgery [facility]  Procedures:   None  Antimicrobials:      Subjective:  Reports that she had 2 BMs that 5:30 AM and 8 AM.  No further abdominal pain, nausea or vomiting.  Reports that she has been tolerating water with her meds and wondering why she has to go on clear liquid diets rather than solid food.  No other complaints reported.  Objective:   Vitals:   09/06/21 1254 09/06/21 2112 09/07/21 0613 09/07/21 1300  BP: 122/84 128/84 129/85 (!) 126/92  Pulse: (!) 101 100 95 89  Resp: _0 15  Temp: 98.3 F (36.8 C) 98.5 F (36.9 C) 98.6 F (37 C) 99.1 F (37.3 C)  TempSrc: Oral Oral Oral Oral  SpO2: 100% 98% 100% 100%  Weight:      Height:        General exam: Young female, moderately built and nourished lying comfortably supine in bed without distress.  Oral mucosa  moist. Respiratory system: Clear to auscultation. Respiratory effort normal. Cardiovascular system: S1 & S2 heard, RRR. No JVD, murmurs, rubs, gallops or clicks. No pedal edema. Gastrointestinal system: Abdomen is minimally distended, soft and nontender.  Slightly hyperactive bowel sounds heard. Central nervous system: Alert and oriented. No focal neurological deficits. Extremities: Symmetric 5 x 5 power. Skin: No rashes, lesions or ulcers Psychiatry: Judgement and insight appear normal. Mood & affect appropriate.     Data Reviewed:   I have personally reviewed following labs and imaging studies   CBC: Recent Labs  Lab 09/05/21 1843 09/06/21 0413 09/07/21 0455  WBC 6.6 2.8* 5.1  NEUTROABS 4.9  --   --   HGB 14.1 11.7* 10.9*  HCT 42.2 36.2 33.9*  MCV 83.2 84.8 86.0  PLT 334 244 338    Basic Metabolic Panel: Recent Labs  Lab 09/05/21 1843 09/06/21 0413 09/07/21 0455  NA 138 138 137  K 3.6 3.7 3.9  CL 100 106 103  CO2 24 23 19*  GLUCOSE 122* 103* 83  BUN 22* 19 13  CREATININE 0.96 0.87 0.59  CALCIUM 9.6 8.5* 8.5*    Liver Function Tests: Recent Labs  Lab 09/05/21 1843 09/07/21 0455  AST 23 19  ALT 20 16  ALKPHOS 113 76  BILITOT 2.0* 1.3*  PROT 8.7* 6.6  ALBUMIN 4.3 3.4*    CBG: No results for input(s): GLUCAP in the last 168 hours.  Microbiology Studies:  No results found for this or any previous visit (from the past 240 hour(s)).  Radiology Studies:  CT ABDOMEN PELVIS W CONTRAST  Result Date: 09/05/2021 CLINICAL DATA:  Generalized abdominal pain, vomiting. Bowel obstruction suspected EXAM: CT ABDOMEN AND PELVIS WITH CONTRAST TECHNIQUE: Multidetector CT imaging of the abdomen and pelvis was performed using the standard protocol following bolus administration of intravenous contrast. RADIATION DOSE REDUCTION: This exam was performed according to the departmental dose-optimization program which includes automated exposure control, adjustment of the mA  and/or kV according to patient size and/or use of iterative reconstruction technique. CONTRAST:  163m OMNIPAQUE IOHEXOL 300 MG/ML  SOLN COMPARISON:  06/07/2021 FINDINGS: Lower chest: No acute abnormality Hepatobiliary: No focal hepatic abnormality. Gallbladder unremarkable. Pancreas: No focal abnormality or ductal dilatation. Spleen: No focal abnormality.  Normal size. Adrenals/Urinary Tract: No adrenal abnormality. No focal renal abnormality. No stones or hydronephrosis. Urinary bladder is unremarkable. Stomach/Bowel: Changes of subtotal colectomy. Significantly dilated small bowel loops with scattered air-fluid levels. Loops measure up to 4.3 cm. Transition is at the ileosigmoid anastomosis in the upper pelvis, similar to prior study. Vascular/Lymphatic: No evidence of aneurysm or adenopathy. Reproductive: Fibroid uterus.  No adnexal mass. Other: No free fluid or free air. Musculoskeletal: No acute bony abnormality. IMPRESSION: Recurrent small-bowel obstruction at the ileosigmoid anastomosis in the upper pelvis. Appearance is similar to prior study. Electronically Signed   By: KRolm BaptiseM.D.   On: 09/05/2021 20:28   DG Abd Portable 1V  Result Date: 09/07/2021 CLINICAL DATA:  BM x2 overnight. EXAM: PORTABLE ABDOMEN - 1 VIEW COMPARISON:  Multiple priors including most recent abdominal radiograph Sep 06, 2021. FINDINGS: Radiopaque enteric contrast material projects  over the rectal vault. Persistent dilation/distension of loops of small bowel throughout the abdomen in this patient who is status post subtotal colectomy. Calcified uterine leiomyoma. IMPRESSION: Persistent dilation/distension of small bowel throughout the abdomen, but with radiopaque enteric contrast projecting over the rectal vault, most consistent with partial obstruction. Electronically Signed   By: Dahlia Bailiff M.D.   On: 09/07/2021 08:52   DG Abd Portable 1V  Result Date: 09/06/2021 CLINICAL DATA:  Small-bowel obstruction EXAM:  PORTABLE ABDOMEN - 1 VIEW COMPARISON:  Portable exam 1033 hours compared to 06/12/2021 FINDINGS: Persistent gaseous distension of small bowel loops in the abdomen. Largest small bowel loop measures 6.7 cm diameter, increased. Excreted contrast in bladder. Probable medication tablet projects over stomach. No bowel wall thickening. Nasogastric tube no longer identified. IMPRESSION: Persistent small bowel obstruction, with increased small bowel dilatation since previous exam. Electronically Signed   By: Lavonia Dana M.D.   On: 09/06/2021 10:50   DG Abd Portable 1V-Small Bowel Obstruction Protocol-initial, 8 hr delay  Result Date: 09/06/2021 CLINICAL DATA:  Small-bowel protocol, 8 hours postcontrast. EXAM: PORTABLE ABDOMEN - 1 VIEW COMPARISON:  Abdominal x-ray 09/06/2021. CT abdomen and pelvis 09/05/2021 FINDINGS: Oral contrast remains in the stomach on this post 8 hour film. Dilated central small bowel loops appear unchanged measuring up to 6 cm. Minimal colonic gas identified. Contrast in the bladder has decreased. No acute fractures. IMPRESSION: 1. Oral contrast remains in the stomach. Dilated small bowel persists, unchanged. Findings are compatible with high-grade small-bowel obstruction. Electronically Signed   By: Ronney Asters M.D.   On: 09/06/2021 20:25    Scheduled Meds:    enoxaparin (LOVENOX) injection  40 mg Subcutaneous Q24H   mesalamine  2.4 g Oral BID WC   methylPREDNISolone (SOLU-MEDROL) injection  40 mg Intravenous Daily   potassium chloride  10 mEq Oral Daily    Continuous Infusions:    sodium chloride 125 mL/hr at 09/07/21 1034     LOS: 1 day     Vernell Leep, MD,  FACP, [facility], [facility], [facility] (Care Management Physician Certified) [place]  To contact the attending provider between 7A-7P or the covering provider during after hours 7P-7A, please log into the web site [email or web address] and access using universal [place] password for that web  site. If you do not have the password, please call the hospital operator.  09/07/2021, 1:25 PM

## 2021-09-08 DIAGNOSIS — K56609 Unspecified intestinal obstruction, unspecified as to partial versus complete obstruction: Secondary | ICD-10-CM | POA: Diagnosis not present

## 2021-09-08 LAB — CBC
HCT: 35.4 % — ABNORMAL LOW (ref 36.0–46.0)
Hemoglobin: 11.1 g/dL — ABNORMAL LOW (ref 12.0–15.0)
MCH: 26.9 pg (ref 26.0–34.0)
MCHC: 31.4 g/dL (ref 30.0–36.0)
MCV: 85.9 fL (ref 80.0–100.0)
Platelets: 242 10*3/uL (ref 150–400)
RBC: 4.12 MIL/uL (ref 3.87–5.11)
RDW: 14.2 % (ref 11.5–15.5)
WBC: 4.5 10*3/uL (ref 4.0–10.5)
nRBC: 0 % (ref 0.0–0.2)

## 2021-09-08 LAB — SURGICAL PATHOLOGY

## 2021-09-08 LAB — BASIC METABOLIC PANEL
Anion gap: 10 (ref 5–15)
BUN: 11 mg/dL (ref 6–20)
CO2: 22 mmol/L (ref 22–32)
Calcium: 8.7 mg/dL — ABNORMAL LOW (ref 8.9–10.3)
Chloride: 104 mmol/L (ref 98–111)
Creatinine, Ser: 0.76 mg/dL (ref 0.44–1.00)
GFR, Estimated: >60 mL/min (ref >60)
Glucose, Bld: 76 mg/dL (ref 70–99)
Potassium: 4.8 mmol/L (ref 3.5–5.1)
Sodium: 136 mmol/L (ref 135–145)

## 2021-09-08 MED ORDER — PREDNISONE 20 MG PO TABS: ORAL_TABLET | ORAL | 0 refills | Status: DC

## 2021-09-08 NOTE — Discharge Summary (Signed)
Physician Discharge Summary  [name] WPV:[id number] DOB: 1975-08-04  PCP: Pcp, No  Admitted from: Home Discharged to: Home  Admit date: 09/05/2021 Discharge date: 09/08/2021  Recommendations for Outpatient Follow-up:    Follow-up Information     [name], MD Follow up.   Specialties: General Surgery, Colon and Rectal Surgery Contact information: [address] [place] [address] [phone]         [name], MD Follow up on 09/14/2021.   Specialty: Gastroenterology Why: 3:30 PM.  Keep this prior appointment.  Could consider lab work as deemed necessary at this visit. Contact information: [address] [place] [address] [phone]                  Home Health: None    Equipment/Devices: None    Discharge Condition: Improved and stable.   Code Status: Full Code Diet recommendation:  Discharge Diet Orders (From admission, onward)     Start     Ordered   09/08/21 0000  Diet - low sodium heart healthy       Comments: Soft low residue diet.   09/08/21 1224             Discharge Diagnoses:  Principal Problem:   SBO (small bowel obstruction) ([place]) Active Problems:   Crohn's disease of small and large intestines with complication ([place])   Malignant neoplasm of upper-outer quadrant of right breast in female, estrogen receptor positive ([place])   Brief Summary: 46 year old female, PMH of Crohn's ileocolitis, s/p subtotal colectomy with ileosigmoid anastomosis, recurrent SBO's, breast cancer s/p bilateral mastectomies, chemoradiation, iron deficiency anemia, vitamin D deficiency, presented with abdominal pain, emesis and admitted for another episode of SBO.  Treated conservatively.  General surgery and [facility] consulted.  Now having BMs, passing flatus but KUB shows PSBO with stool in the rectal vault.  GI performed sigmoidoscopy 5/31 which showed findings of active Crohn's disease.  Clinically improved, SBO resolved,  tolerating diet, discharging home on gradual prednisone taper until close outpatient follow-up with GI.     Assessment & Plan:   Recurrent SBO:  Reportedly fourth admit for SBO in the last year. CT A/P shows SBO at the ileosigmoid anastomosis. General surgery consultation appreciated.  Treated supportively with SBO protocol, bowel rest, IV fluids, antiemetics and pain medications and gradually advancing diet.  No NG placed. GI consulted to optimize Crohn's treatment with the hope to minimize the recurrent SBO's. [facility] follow-up today appreciated and as per their summary: Flexible sigmoidoscopy on 5/31 showed side-to-side anastomosis in the sigmoid colon with edema/erythema/ulcers and moderate stenosis, able to pass scope 11.5 mm, approximately 10 cm of distal ileum transversed and was normal, localized moderate inflammation with erythema and friability linear erosions distal rectum-biopsies pending. Thereby etiology of her SBO is due to active Crohn's/Crohn's flare with edema and stenosis. Patient treated with IV Solu-Medrol in the hospital and at discharge, GI recommends slow prednisone taper as noted below, have performed labs needed to consider initiation of Biologics but they indicate that this will be a challenge in the setting of very recent completion of treatment for breast cancer. Hepatitis serologies negative.  TB QuantiFERON gold results pending and can be followed up as outpatient. Continue mesalamine as per previous home dose of 1.2, 2 tabs twice daily. ESR 26, CRP 5.3, fecal calprotectin pending.   General surgery also saw and cleared for discharge.  She remains hesitant about following up for possible surgery.  However  surgeons information has been included in AVS and patient encouraged to follow-up.   Crohn's ileocolitis [facility] input appreciated. Management as noted above.  Breast cancer S/p chemotherapy followed by bilateral mastectomies and then neoadjuvant  treatment. Follows with Dr. Lindi Adie oncology.  Will forward copy of the DC summary to him.   Iron deficiency anemia: Hemoglobin 14.1 on admission has dropped to 11.7 >10.9 >11.1.  Hemoglobin in the 11 g range possibly her baseline.  Relatively stable. Outpatient follow-up.   Leukopenia WBC has dropped from 6.6-2.8.  Etiology unclear.  Suspect lab error WBC normal.   Body mass index is 24.03 kg/m.      Consultants:   General surgery [facility]   Procedures:   None   Discharge Instructions  Discharge Instructions     Call MD for:  difficulty breathing, headache or visual disturbances   Complete by: As directed    Call MD for:  extreme fatigue   Complete by: As directed    Call MD for:  persistant dizziness or light-headedness   Complete by: As directed    Call MD for:  persistant nausea and vomiting   Complete by: As directed    Call MD for:  severe uncontrolled pain   Complete by: As directed    Call MD for:  temperature >100.4   Complete by: As directed    Diet - low sodium heart healthy   Complete by: As directed    Soft low residue diet.   Increase activity slowly   Complete by: As directed         Medication List     TAKE these medications    acetaminophen 500 MG tablet Commonly known as: TYLENOL Take 500-1,000 mg by mouth every 6 (six) hours as needed for mild pain.   IRON PO Take 1 tablet by mouth daily.   mesalamine 1.2 g EC tablet Commonly known as: Lialda Take 2 tablets (2.4 g total) by mouth 2 (two) times daily with a meal.   multivitamin with minerals tablet Take 1 tablet by mouth daily.   potassium chloride 10 MEQ tablet Commonly known as: KLOR-CON M Take 10 mEq by mouth daily.   predniSONE 20 MG tablet Commonly known as: DELTASONE Take 2 tabs (40 mg total) daily x7 days, then 1.5 tabs (30 mg total) daily x7 days, then 1 tab (20 Mg total) daily thereafter until follow-up with your gastroenterologist.   vitamin B-12 100 MCG  tablet Commonly known as: CYANOCOBALAMIN Take 100 mcg by mouth daily.       Allergies  Allergen Reactions   Nsaids Other (See Comments)    Crohns disease/ IBD   Other     BLOOD PRODUCT REFUSAL      Procedures/Studies: CT ABDOMEN PELVIS W CONTRAST  Result Date: 09/05/2021 CLINICAL DATA:  Generalized abdominal pain, vomiting. Bowel obstruction suspected EXAM: CT ABDOMEN AND PELVIS WITH CONTRAST TECHNIQUE: Multidetector CT imaging of the abdomen and pelvis was performed using the standard protocol following bolus administration of intravenous contrast. RADIATION DOSE REDUCTION: This exam was performed according to the departmental dose-optimization program which includes automated exposure control, adjustment of the mA and/or kV according to patient size and/or use of iterative reconstruction technique. CONTRAST:  183m OMNIPAQUE IOHEXOL 300 MG/ML  SOLN COMPARISON:  06/07/2021 FINDINGS: Lower chest: No acute abnormality Hepatobiliary: No focal hepatic abnormality. Gallbladder unremarkable. Pancreas: No focal abnormality or ductal dilatation. Spleen: No focal abnormality.  Normal size. Adrenals/Urinary Tract: No adrenal abnormality. No focal renal abnormality.  No stones or hydronephrosis. Urinary bladder is unremarkable. Stomach/Bowel: Changes of subtotal colectomy. Significantly dilated small bowel loops with scattered air-fluid levels. Loops measure up to 4.3 cm. Transition is at the ileosigmoid anastomosis in the upper pelvis, similar to prior study. Vascular/Lymphatic: No evidence of aneurysm or adenopathy. Reproductive: Fibroid uterus.  No adnexal mass. Other: No free fluid or free air. Musculoskeletal: No acute bony abnormality. IMPRESSION: Recurrent small-bowel obstruction at the ileosigmoid anastomosis in the upper pelvis. Appearance is similar to prior study. Electronically Signed   By: Rolm Baptise M.D.   On: 09/05/2021 20:28   DG Abd Portable 1V  Result Date: 09/07/2021 CLINICAL DATA:   BM x2 overnight. EXAM: PORTABLE ABDOMEN - 1 VIEW COMPARISON:  Multiple priors including most recent abdominal radiograph Sep 06, 2021. FINDINGS: Radiopaque enteric contrast material projects over the rectal vault. Persistent dilation/distension of loops of small bowel throughout the abdomen in this patient who is status post subtotal colectomy. Calcified uterine leiomyoma. IMPRESSION: Persistent dilation/distension of small bowel throughout the abdomen, but with radiopaque enteric contrast projecting over the rectal vault, most consistent with partial obstruction. Electronically Signed   By: Dahlia Bailiff M.D.   On: 09/07/2021 08:52   DG Abd Portable 1V  Result Date: 09/06/2021 CLINICAL DATA:  Small-bowel obstruction EXAM: PORTABLE ABDOMEN - 1 VIEW COMPARISON:  Portable exam 1033 hours compared to 06/12/2021 FINDINGS: Persistent gaseous distension of small bowel loops in the abdomen. Largest small bowel loop measures 6.7 cm diameter, increased. Excreted contrast in bladder. Probable medication tablet projects over stomach. No bowel wall thickening. Nasogastric tube no longer identified. IMPRESSION: Persistent small bowel obstruction, with increased small bowel dilatation since previous exam. Electronically Signed   By: Lavonia Dana M.D.   On: 09/06/2021 10:50   DG Abd Portable 1V-Small Bowel Obstruction Protocol-initial, 8 hr delay  Result Date: 09/06/2021 CLINICAL DATA:  Small-bowel protocol, 8 hours postcontrast. EXAM: PORTABLE ABDOMEN - 1 VIEW COMPARISON:  Abdominal x-ray 09/06/2021. CT abdomen and pelvis 09/05/2021 FINDINGS: Oral contrast remains in the stomach on this post 8 hour film. Dilated central small bowel loops appear unchanged measuring up to 6 cm. Minimal colonic gas identified. Contrast in the bladder has decreased. No acute fractures. IMPRESSION: 1. Oral contrast remains in the stomach. Dilated small bowel persists, unchanged. Findings are compatible with high-grade small-bowel obstruction.  Electronically Signed   By: Ronney Asters M.D.   On: 09/06/2021 20:25      Subjective: Denied complaints.  Seen this morning.  Was starting to take full liquids for breakfast and then try soft diet for lunch.  Has had several BMs in the last 24 hours.  No further nausea, vomiting or abdominal pain for more than 48 hours.  Eager to discharge home.  Discharge Exam:  Vitals:   09/07/21 1540 09/07/21 1609 09/07/21 2138 09/08/21 0612  BP: 140/89 (!) 134/96 135/82 109/83  Pulse: 80 85 97 67  Resp: 19 16 18 16   Temp:  97.6 F (36.4 C) 98.6 F (37 C) 98.4 F (36.9 C)  TempSrc:  Oral Oral Oral  SpO2: 100% 100% 100% 100%  Weight:      Height:        General exam: Young female, moderately built and nourished lying comfortably supine in bed without distress.  Oral mucosa moist. Respiratory system: Clear to auscultation. Respiratory effort normal. Cardiovascular system: S1 & S2 heard, RRR. No JVD, murmurs, rubs, gallops or clicks. No pedal edema. Gastrointestinal system: Abdomen is minimally distended, soft and nontender.  Normal bowel sounds heard. Central nervous system: Alert and oriented. No focal neurological deficits. Extremities: Symmetric 5 x 5 power. Skin: No rashes, lesions or ulcers Psychiatry: Judgement and insight appear normal. Mood & affect appropriate.     The results of significant diagnostics from this hospitalization (including imaging, microbiology, ancillary and laboratory) are listed below for reference.     Microbiology: No results found for this or any previous visit (from the past 240 hour(s)).   Labs: CBC: Recent Labs  Lab 09/05/21 1843 09/06/21 0413 09/07/21 0455 09/08/21 0446  WBC 6.6 2.8* 5.1 4.5  NEUTROABS 4.9  --   --   --   HGB 14.1 11.7* 10.9* 11.1*  HCT 42.2 36.2 33.9* 35.4*  MCV 83.2 84.8 86.0 85.9  PLT 334 244 224 421    Basic Metabolic Panel: Recent Labs  Lab 09/05/21 1843 09/06/21 0413 09/07/21 0455 09/08/21 0446  NA 138 138 137  136  K 3.6 3.7 3.9 4.8  CL 100 106 103 104  CO2 24 23 19* 22  GLUCOSE 122* 103* 83 76  BUN 22* 19 13 11   CREATININE 0.96 0.87 0.59 0.76  CALCIUM 9.6 8.5* 8.5* 8.7*    Liver Function Tests: Recent Labs  Lab 09/05/21 1843 09/07/21 0455  AST 23 19  ALT 20 16  ALKPHOS 113 76  BILITOT 2.0* 1.3*  PROT 8.7* 6.6  ALBUMIN 4.3 3.4*     Time coordinating discharge: 25 minutes  SIGNED:  Vernell Leep, MD,  FACP, [facility], [facility], [facility] (Care Management Physician Certified). [facility]  To contact the attending provider between 7A-7P or the covering provider during after hours 7P-7A, please log into the web site [email or web address] and access using universal [place] password for that web site. If you do not have the password, please call the hospital operator.

## 2021-09-08 NOTE — Progress Notes (Signed)
Discharge instructions discussed with patient, verbalized agreement and understanding 

## 2021-09-08 NOTE — Progress Notes (Cosign Needed)
Patient ID: [name], female   DOB: 08-05-1975, 46 y.o.   MRN: [id number]    Progress Note   Subjective   Day # 3 CC; recurrent small bowel obstruction  IV Solu-Medrol  Flexible sigmoidoscopy yesterday-side-to-side anastomosis in the sigmoid colon showed edema/erythema/ulcers and moderate stenosis, able to pass scope 11.5 mm, approximately 10 cm of distal ileum traversed and was normal, localized moderate inflammation with erythema and friability linear erosions distal rectum-biopsies pending  WBC 4.5/hemoglobin 11.1  Feeling better, able to tolerate p.o.'s, no significant abdominal pain, denies any vomiting, has had further bowel movements.  She would like to go home.     Objective   Vital signs in last 24 hours: Temp:  [97.6 F (36.4 C)-99.1 F (37.3 C)] 98.4 F (36.9 C) (06/01 0612) Pulse Rate:  [67-97] 67 (06/01 0612) Resp:  [15-21] 16 (06/01 0612) BP: (109-141)/(74-96) 109/83 (06/01 0612) SpO2:  [100 %] 100 % (06/01 0612) Weight:  [63.5 kg] 63.5 kg (05/31 1424) Last BM Date : 09/04/21 General: African-American female in NAD Heart:  Regular rate and rhythm; no murmurs Lungs: Respirations even and unlabored, lungs CTA bilaterally Abdomen:  Soft, nontender and nondistended.  Bowel sounds present Extremities:  Without edema. Neurologic:  Alert and oriented,  grossly normal neurologically. Psych:  Cooperative. Normal mood and affect.  Intake/Output from previous day: 05/31 0701 - 06/01 0700 In: 1392.2 [P.O.:480; I.V.:912.2] Out: 0  Intake/Output this shift: No intake/output data recorded.  Lab Results: Recent Labs    09/06/21 0413 09/07/21 0455 09/08/21 0446  WBC 2.8* 5.1 4.5  HGB 11.7* 10.9* 11.1*  HCT 36.2 33.9* 35.4*  PLT 244 224 242   BMET Recent Labs    09/06/21 0413 09/07/21 0455 09/08/21 0446  NA 138 137 136  K 3.7 3.9 4.8  CL 106 103 104  CO2 23 19* 22  GLUCOSE 103* 83 76  BUN 19 13 11   CREATININE 0.87 0.59 0.76  CALCIUM 8.5* 8.5*  8.7*   LFT Recent Labs    09/07/21 0455  PROT 6.6  ALBUMIN 3.4*  AST 19  ALT 16  ALKPHOS 76  BILITOT 1.3*   PT/INR No results for input(s): LABPROT, INR in the last 72 hours.  Studies/Results: DG Abd Portable 1V  Result Date: 09/07/2021 CLINICAL DATA:  BM x2 overnight. EXAM: PORTABLE ABDOMEN - 1 VIEW COMPARISON:  Multiple priors including most recent abdominal radiograph Sep 06, 2021. FINDINGS: Radiopaque enteric contrast material projects over the rectal vault. Persistent dilation/distension of loops of small bowel throughout the abdomen in this patient who is status post subtotal colectomy. Calcified uterine leiomyoma. IMPRESSION: Persistent dilation/distension of small bowel throughout the abdomen, but with radiopaque enteric contrast projecting over the rectal vault, most consistent with partial obstruction. Electronically Signed   By: Dahlia Bailiff M.D.   On: 09/07/2021 08:52   DG Abd Portable 1V  Result Date: 09/06/2021 CLINICAL DATA:  Small-bowel obstruction EXAM: PORTABLE ABDOMEN - 1 VIEW COMPARISON:  Portable exam 1033 hours compared to 06/12/2021 FINDINGS: Persistent gaseous distension of small bowel loops in the abdomen. Largest small bowel loop measures 6.7 cm diameter, increased. Excreted contrast in bladder. Probable medication tablet projects over stomach. No bowel wall thickening. Nasogastric tube no longer identified. IMPRESSION: Persistent small bowel obstruction, with increased small bowel dilatation since previous exam. Electronically Signed   By: Lavonia Dana M.D.   On: 09/06/2021 10:50   DG Abd Portable 1V-Small Bowel Obstruction Protocol-initial, 8 hr delay  Result Date: 09/06/2021 CLINICAL DATA:  Small-bowel protocol, 8 hours postcontrast. EXAM: PORTABLE ABDOMEN - 1 VIEW COMPARISON:  Abdominal x-ray 09/06/2021. CT abdomen and pelvis 09/05/2021 FINDINGS: Oral contrast remains in the stomach on this post 8 hour film. Dilated central small bowel loops appear unchanged  measuring up to 6 cm. Minimal colonic gas identified. Contrast in the bladder has decreased. No acute fractures. IMPRESSION: 1. Oral contrast remains in the stomach. Dilated small bowel persists, unchanged. Findings are compatible with high-grade small-bowel obstruction. Electronically Signed   By: Ronney Asters M.D.   On: 09/06/2021 20:25       Assessment / Plan:    #33 46 year old African-American female with history of Crohn's ileocolitis status post subtotal colectomy with ileosigmoid anastomosis 2021, currently admitted with fourth small bowel obstruction within the past year.  She has had abrupt transition at the site of the anastomosis on imaging  Flexible sigmoidoscopy yesterday does show active Crohn's at the anastomosis with stricturing, though able to pass the 11.5 mm scope.  #2 breast cancer-diagnosed February 2022/stage IIIb, she has completed chemotherapy, bilateral mastectomies, neoadjuvant treatment and just had breast reconstructive surgery April 2023.  Had been on Herceptin, this was discontinued as there was concern for whether this may have been causing an inflammatory process of the small bowel.  Plan; soft low residue diet It is okay for discharge to home today Transition to prednisone 40 mg p.o. every morning x1 week, then 30 mg p.o. every morning x1 week, then 20 mg daily thereafter until further instructions. She can continue Lialda 1.2,  2 tablets twice daily QuantiFERON gold and hepatitis serologies ordered  He has follow-up appointment with Dr. Carlean Purl in the office next week-decisions will need to be made moving forward regarding potential biologic therapy which will be a challenge in the setting of very recent completion of treatment for breast cancer.    Principal Problem:   SBO (small bowel obstruction) ([facility]) Active Problems:   Malignant neoplasm of upper-outer quadrant of right breast in female, estrogen receptor positive ([place])   Crohn's disease of small and  large intestines with complication ([place])     LOS: 2 days   Amy EsterwoodPA-C  09/08/2021, 8:55 AM   Attending physician's note   I have reviewed the chart. I agree with the Advanced Practitioner's note, impression and recommendations.  Patient was already discharged by the time I made my rounds.  I have reviewed biopsy results.  I have also discussed with Dr. Carlean Purl.  She has follow-up with him in coming days.   Carmell Austria, MD [facility] [phone]

## 2021-09-08 NOTE — Discharge Instructions (Signed)

## 2021-09-08 NOTE — Progress Notes (Signed)
1 Day Post-Op  Subjective: CC: S/p flex sig yesterday Denies any abdominal pain, nausea or vomiting since I saw her yesterday.  Feels less bloated/distended.  Tolerating CLD.  Passing flatus.  2 additional BMs yesterday/last night that are still liquidy but becoming more formed.  Objective: Vital signs in last 24 hours: Temp:  [97.6 F (36.4 C)-99.1 F (37.3 C)] 98.4 F (36.9 C) (06/01 0612) Pulse Rate:  [67-97] 67 (06/01 0612) Resp:  [15-21] 16 (06/01 0612) BP: (109-141)/(74-96) 109/83 (06/01 0612) SpO2:  [100 %] 100 % (06/01 0612) Weight:  [63.5 kg] 63.5 kg (05/31 1424) Last BM Date : 09/04/21  Intake/Output from previous day: 05/31 0701 - 06/01 0700 In: 1392.2 [P.O.:480; I.V.:912.2] Out: 0  Intake/Output this shift: No intake/output data recorded.  PE: Gen:  Alert, NAD, pleasant Pulm: Normal rate and effort  Abd: Abdomen remains soft, completely NT, with +bs and improved distension Psych: A&Ox3   Lab Results:  Recent Labs    09/07/21 0455 09/08/21 0446  WBC 5.1 4.5  HGB 10.9* 11.1*  HCT 33.9* 35.4*  PLT 224 242   BMET Recent Labs    09/07/21 0455 09/08/21 0446  NA 137 136  K 3.9 4.8  CL 103 104  CO2 19* 22  GLUCOSE 83 76  BUN 13 11  CREATININE 0.59 0.76  CALCIUM 8.5* 8.7*   PT/INR No results for input(s): LABPROT, INR in the last 72 hours. CMP     Component Value Date/Time   NA 136 09/08/2021 0446   NA 135 01/21/2020 1056   K 4.8 09/08/2021 0446   CL 104 09/08/2021 0446   CO2 22 09/08/2021 0446   GLUCOSE 76 09/08/2021 0446   BUN 11 09/08/2021 0446   BUN 12 01/21/2020 1056   CREATININE 0.76 09/08/2021 0446   CREATININE 1.17 (H) 07/15/2021 1016   CALCIUM 8.7 (L) 09/08/2021 0446   PROT 6.6 09/07/2021 0455   ALBUMIN 3.4 (L) 09/07/2021 0455   AST 19 09/07/2021 0455   AST 16 07/15/2021 1016   ALT 16 09/07/2021 0455   ALT 21 07/15/2021 1016   ALKPHOS 76 09/07/2021 0455   BILITOT 1.3 (H) 09/07/2021 0455   BILITOT 1.1 07/15/2021 1016    GFRNONAA >60 09/08/2021 0446   GFRNONAA 59 (L) 07/15/2021 1016   GFRAA 108 01/21/2020 1056   Lipase     Component Value Date/Time   LIPASE 26 09/05/2021 1843    Studies/Results: DG Abd Portable 1V  Result Date: 09/07/2021 CLINICAL DATA:  BM x2 overnight. EXAM: PORTABLE ABDOMEN - 1 VIEW COMPARISON:  Multiple priors including most recent abdominal radiograph Sep 06, 2021. FINDINGS: Radiopaque enteric contrast material projects over the rectal vault. Persistent dilation/distension of loops of small bowel throughout the abdomen in this patient who is status post subtotal colectomy. Calcified uterine leiomyoma. IMPRESSION: Persistent dilation/distension of small bowel throughout the abdomen, but with radiopaque enteric contrast projecting over the rectal vault, most consistent with partial obstruction. Electronically Signed   By: Dahlia Bailiff M.D.   On: 09/07/2021 08:52   DG Abd Portable 1V  Result Date: 09/06/2021 CLINICAL DATA:  Small-bowel obstruction EXAM: PORTABLE ABDOMEN - 1 VIEW COMPARISON:  Portable exam 1033 hours compared to 06/12/2021 FINDINGS: Persistent gaseous distension of small bowel loops in the abdomen. Largest small bowel loop measures 6.7 cm diameter, increased. Excreted contrast in bladder. Probable medication tablet projects over stomach. No bowel wall thickening. Nasogastric tube no longer identified. IMPRESSION: Persistent small bowel obstruction, with increased small bowel  dilatation since previous exam. Electronically Signed   By: Lavonia Dana M.D.   On: 09/06/2021 10:50   DG Abd Portable 1V-Small Bowel Obstruction Protocol-initial, 8 hr delay  Result Date: 09/06/2021 CLINICAL DATA:  Small-bowel protocol, 8 hours postcontrast. EXAM: PORTABLE ABDOMEN - 1 VIEW COMPARISON:  Abdominal x-ray 09/06/2021. CT abdomen and pelvis 09/05/2021 FINDINGS: Oral contrast remains in the stomach on this post 8 hour film. Dilated central small bowel loops appear unchanged measuring up to 6 cm.  Minimal colonic gas identified. Contrast in the bladder has decreased. No acute fractures. IMPRESSION: 1. Oral contrast remains in the stomach. Dilated small bowel persists, unchanged. Findings are compatible with high-grade small-bowel obstruction. Electronically Signed   By: Ronney Asters M.D.   On: 09/06/2021 20:25    Anti-infectives: Anti-infectives (From admission, onward)    None        Assessment/Plan Recurrent SBO - 4th admit for sbo in the last year Hx Crohn's s/p robotic assisted subtotal colectomy with ileo-sigmoid anastomosis 06/27/19 by Dr. Marcello Moores.  On maintenance Mesalamine - CT A/P w/ sbo at the ileosigmoid anastomosis.  - Patient with ROBF, NT on exam, contrast passed area of obstruction on xray, and GI was able to transverse ileocolonic anastomosis during flex sig yesterday. Adv diet (FLD > Soft). If tolerates, can be d/c'd from our standpoint.  - Flex Sig yesterday showed Crohn's changes at the area of of the ileocolonic anastomosis with moderate stenosis but was able to be transversed.  There is also Crohn's changes of the distal rectum. On IV steroids per GI.  They are planning to transition to p.o. steroid taper soon and are evaluating starting Biologics.  - Rediscussed following up in the office with Dr. Marcello Moores to discuss possible elective surgery given Crohn's-like changes noted at her ileocolonic anastomosis with moderate stenosis and this is her fourth SBO in the last year.  She is still hesitant about following up or possible surgery.  Will include Dr. Marcello Moores information in the discharge AVS.    FEN: FLD, ADAT, IVF per [facility] VTE: SCDs, Lovenox  ID: None   LOS: 2 days    Jillyn Ledger , [facility] Surgery 09/08/2021, 8:25 AM Please see Amion for pager number during day hours 7:00am-4:30pm

## 2021-09-08 NOTE — Anesthesia Postprocedure Evaluation (Signed)
Anesthesia Post Note  Patient: [name]  Procedure(s) Performed: FLEXIBLE SIGMOIDOSCOPY BIOPSY     Patient location during evaluation: PACU Anesthesia Type: MAC Level of consciousness: awake and alert Pain management: pain level controlled Vital Signs Assessment: post-procedure vital signs reviewed and stable Respiratory status: spontaneous breathing, nonlabored ventilation, respiratory function stable and patient connected to nasal cannula oxygen Cardiovascular status: stable and blood pressure returned to baseline Postop Assessment: no apparent nausea or vomiting Anesthetic complications: no   No notable events documented.  Last Vitals:  Vitals:   09/07/21 2138 09/08/21 0612  BP: 135/82 109/83  Pulse: 97 67  Resp: 18 16  Temp: 37 C 36.9 C  SpO2: 100% 100%    Last Pain:  Vitals:   09/08/21 0612  TempSrc: Oral  PainSc:                  Ailee Pates S

## 2021-09-11 LAB — QUANTIFERON-TB GOLD PLUS (RQFGPL)
QuantiFERON Mitogen Value: >10 IU/mL
QuantiFERON Nil Value: 0.02 IU/mL
QuantiFERON TB1 Ag Value: 0.03 IU/mL
QuantiFERON TB2 Ag Value: 0.03 IU/mL

## 2021-09-11 LAB — QUANTIFERON-TB GOLD PLUS: QuantiFERON-TB Gold Plus: NEGATIVE

## 2021-09-14 ENCOUNTER — Ambulatory Visit (INDEPENDENT_AMBULATORY_CARE_PROVIDER_SITE_OTHER): Payer: Managed Care, Other (non HMO) | Admitting: Internal Medicine

## 2021-09-14 ENCOUNTER — Encounter: Payer: Self-pay | Admitting: Internal Medicine

## 2021-09-14 VITALS — BP 110/72 | HR 103 | Ht 64.0 in | Wt 138.8 lb

## 2021-09-14 DIAGNOSIS — K50012 Crohn's disease of small intestine with intestinal obstruction: Secondary | ICD-10-CM | POA: Diagnosis not present

## 2021-09-14 DIAGNOSIS — C50411 Malignant neoplasm of upper-outer quadrant of right female breast: Secondary | ICD-10-CM

## 2021-09-14 DIAGNOSIS — Z170 Estrogen receptor positive status [ER+]: Secondary | ICD-10-CM

## 2021-09-14 DIAGNOSIS — F418 Other specified anxiety disorders: Secondary | ICD-10-CM | POA: Diagnosis not present

## 2021-09-14 MED ORDER — PREDNISONE 10 MG PO TABS: 20.0000 mg | ORAL_TABLET | Freq: Every day | ORAL | 0 refills | Status: DC

## 2021-09-14 NOTE — Patient Instructions (Addendum)
Today we are providing you with a printed rx for prednisone. When you finish your current bottle start this.  We will work on a referral to [employer]. We will be in touch about this.  Take 1/2 dose of miralax daily.  I appreciate the opportunity to care for you. [name], MD, [facility]

## 2021-09-14 NOTE — Progress Notes (Signed)
[name] 46 y.o. [birth year] [id number]  Assessment & Plan:   Encounter Diagnoses  Name Primary?   Crohn's disease of small intestine with intestinal obstruction ([place]) Yes   Malignant neoplasm of upper-outer quadrant of right breast in female, estrogen receptor positive ([place])    Anxiety about health     Recurrent bowel obstructions thought related to Crohn's disease. Better on prednisone. I think biologic treatment will make sense given clinical course and history. Suppose immunomodulator is a possibility as well I tried to explain how rare the cancer risk is and that IBD itself increases the risk of cancer but got nowhere with that. Understandably anxious about reported (rare) risks of cancer on biologic therapy given just completing therapy for breast cancer She is not ready to process this or decide and has requested referral to tertiary center. I explained that there could be a very long wait for that.   I will have her return in 1 month - to be at 20 mg prednisone then and we will reassess and determine taper and try to discuss other therapy again. Will message Dr. Lindi Adie about his thoughts on biologics. [facility] might be a good choice as bowel specific though formulary coverage dictates what we can do. Long-term steroids not a great choice but I think may need to consider budesonide trial depending upon clinical course and her preferences.  MiraLax prn for constipation start 1/2 dose daily  Note that when I explained I would message Dr. Lindi Adie she indicated that she thought she would not be seeing him anymore as she had completed therapy. Health care literacy has been an issue in past as well.  When she was hospitalized surgery wanted to have seen in office by Dr. Marcello Moores which I think is a good idea - I think trial of medical therapy is reasonable but surgical follow-up is appropriate and helpful. We did not discuss this today nor did I discuss possible balloon dilation of  the stricture. She was upset about the cancer risks of biologics and I dod not go further.   Keep in mind potential side effects of high dose steroids re: anxiety sxs  Cc: Dr. [name] Dr. [name]  Subjective:   Chief Complaint: Crohn's disease  HPI 46 yo woman w/ hx Crohn's ileocolitis s/p subtotal colectomy for stricturing disease with 5 admissions in past year due to SBO - and is here after 5th last week showed recurrent Crohn's in rectum + stricture - she improved on prednisone and is having flatus, stools. She says stools are hard at times and asks about using MiraLax.  She was told about biologics and had neg quantiferon and Hep B Sag at hospital. She read online about biologics and is very concerned about reported risks of cancer associated with them and does not want to take them She has just completed chemotx for breast cancer and had reconstructive surgery.. When last here we wondered if her immunotx (Perjeta) could be causing bowel inflammation.  CT abd/pelvis 5/29 IMPRESSION: Recurrent small-bowel obstruction at the ileosigmoid anastomosis in the upper pelvis. Appearance is similar to prior study.   Flex sig 09/07/21 Anastomotic stenosis with ulcers and edema s/o recurrence of Crohn's disease. Biopsied. - Crohn's proctitis- biopsied.  SURGICAL PATHOLOGY  CASE: [phone]  PATIENT: [name]  Surgical Pathology Report      Clinical History: Partial SBO; History of crohn's (crm)      FINAL MICROSCOPIC DIAGNOSIS:   A. COLON, ILEO-SIGMOID ANASTOMOSIS, BIOPSY:  -  Enteric mucosa with nonspecific architectural and inflammatory  changes, consistent with anastomotic site  - Negative for dysplasia or granulomas   B. RECTUM, BIOPSY:  - Moderately active chronic colitis, consistent with patient's clinical  history of Crohn's disease  - Negative for dysplasia  - See comment Allergies  Allergen Reactions   Nsaids Other (See Comments)    Crohns  disease/ IBD   Other     BLOOD PRODUCT REFUSAL   Current Meds  Medication Sig   acetaminophen (TYLENOL) 500 MG tablet Take 500-1,000 mg by mouth every 6 (six) hours as needed for mild pain.   Ferrous Sulfate (IRON PO) Take 1 tablet by mouth daily.   mesalamine (LIALDA) 1.2 g EC tablet Take 2 tablets (2.4 g total) by mouth 2 (two) times daily with a meal.   Multiple Vitamins-Minerals (MULTIVITAMIN WITH MINERALS) tablet Take 1 tablet by mouth daily.   predniSONE (DELTASONE) 10 MG tablet Take 2 tablets (20 mg total) by mouth daily with breakfast. Stay at this dose until you return to Dr. Carlean Purl   predniSONE (DELTASONE) 20 MG tablet Take 2 tabs (40 mg total) daily x7 days, then 1.5 tabs (30 mg total) daily x7 days, then 1 tab (20 Mg total) daily thereafter until follow-up with your gastroenterologist.   vitamin B-12 (CYANOCOBALAMIN) 100 MCG tablet Take 100 mcg by mouth daily.   Past Medical History:  Diagnosis Date   Cancer [facility])    Colon stricture ([place]) 05/07/2019   Crohn's colitis, with intestinal obstruction ([place]) 09/17/2013   Dx 2006 - right and left colon involved 2011 - pan-colitis 09/17/2013 left colitis 60-20 cm March 2021 subtotal colectomy ileosigmoid anastomosis for colonic strictures-Dr. Marcello Moores     Crohn's disease [facility])    Family history of breast cancer 06/11/2020   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 10/17/2010   Rectal bleeding    Uterine fibroid    Vitamin D deficiency 09/24/2013   Past Surgical History:  Procedure Laterality Date   AXILLARY LYMPH NODE DISSECTION Right 11/09/2020   Procedure: RIGHT AXILLARY LYMPH NODE DISSECTION;  Surgeon: [name], MD;  Location: [place];  Service: General;  Laterality: Right;   BIOPSY  07/01/2020   Procedure: BIOPSY;  Surgeon: Yetta Flock, MD;  [facility];  Service: Gastroenterology;;   BIOPSY  01/27/2021   Procedure: BIOPSY;  Surgeon: Yetta Flock, MD;  [facility];  Service: Gastroenterology;;   BIOPSY  09/07/2021   Procedure: BIOPSY;  Surgeon: Jackquline Denmark, MD;  [facility];  Service: Gastroenterology;;   BREAST RECONSTRUCTION WITH PLACEMENT OF TISSUE EXPANDER AND FLEX HD (ACELLULAR HYDRATED DERMIS) Bilateral 11/09/2020   Procedure: BILATERAL BREAST RECONSTRUCTION WITH PLACEMENT OF TISSUE EXPANDER AND FLEX HD (ACELLULAR HYDRATED DERMIS);  Surgeon: [name], MD;  Location: [place];  Service: Plastics;  Laterality: Bilateral;   CAPSULOTOMY Bilateral 07/25/2021   Procedure: CAPSULOTOMY;  Surgeon: [name], MD;  Location: [place];  Service: Plastics;  Laterality: Bilateral;   COLECTOMY  06/27/2019   COLONOSCOPY  2015   FLEXIBLE SIGMOIDOSCOPY N/A 07/01/2020   Procedure: FLEXIBLE SIGMOIDOSCOPY;  Surgeon: Yetta Flock, MD;  [facility];  Service: Gastroenterology;  Laterality: N/A;   FLEXIBLE SIGMOIDOSCOPY N/A 01/27/2021   Procedure: FLEXIBLE SIGMOIDOSCOPY;  Surgeon: Yetta Flock, MD;  [facility];  Service: Gastroenterology;  Laterality: N/A;   FLEXIBLE SIGMOIDOSCOPY N/A 09/07/2021   Procedure: FLEXIBLE SIGMOIDOSCOPY;  Surgeon: Lyndel Safe,  Peyton Bottoms, MD;  [facility];  Service: Gastroenterology;  Laterality: N/A;   NIPPLE SPARING MASTECTOMY Bilateral 11/09/2020   Procedure: BILATERAL NIPPLE SPARING MASTECTOMY;  Surgeon: [name], MD;  Location: [place];  Service: General;  Laterality: Bilateral;   PORT-A-CATH REMOVAL N/A 07/25/2021   Procedure: REMOVAL PORT-A-CATH;  Surgeon: [name], MD;  Location: [place];  Service: Plastics;  Laterality: N/A;   PORTACATH PLACEMENT N/A 06/23/2020   Procedure: INSERTION PORT-A-CATH;  Surgeon: [name], MD;  Location: [place];  Service: General;  Laterality: N/A;  START TIME OF 11:00 AM FOR 60 MINUTES [place] IQ    REMOVAL OF BILATERAL TISSUE EXPANDERS WITH PLACEMENT OF BILATERAL BREAST IMPLANTS Bilateral 07/25/2021   Procedure: REMOVAL OF BILATERAL TISSUE EXPANDERS WITH PLACEMENT OF BILATERAL BREAST IMPLANTS;  Surgeon: [name], MD;  Location: [place];  Service: Plastics;  Laterality: Bilateral;  1.5   Social History   Social History Narrative   Single, 1 daughter born 2001 approximately   Careers information officer for [employer]   She is a former smoker, occasional alcohol no drug use   family history includes Breast cancer in her cousin and maternal aunt; Diabetes in her father and paternal aunt.   Review of Systems As above  Objective:   Physical Exam BP 110/72   Pulse (!) 103   Ht 5' 4"  (1.626 m)   Wt 138 lb 12.8 oz (63 kg)   BMI 23.82 kg/m  NAD Abd soft NT and benign

## 2021-09-15 ENCOUNTER — Telehealth: Payer: Self-pay

## 2021-09-15 NOTE — Telephone Encounter (Signed)
Referral form for IBD clinic at [facility] faxed to [phone]. There phone # is [phone].

## 2021-09-15 NOTE — Telephone Encounter (Signed)
-----   Message from [name], MD sent at 09/15/2021  8:25 AM EDT ----- Regarding: referral Let's try [facility]  IBD clinic  The # should be [phone]  Reason is crohn's diease recurrent bowel obstruction s/p resection and patient just completed breast cancer treatment and desires second opinion

## 2021-09-28 NOTE — Telephone Encounter (Signed)
I spoke to [facility] and they hope to reach out to her in the next couple of days to set up her appointment.

## 2021-09-29 ENCOUNTER — Other Ambulatory Visit: Payer: Self-pay | Admitting: Nurse Practitioner

## 2021-10-05 NOTE — Telephone Encounter (Signed)
I left her a voicemail checking in with her to make sure [facility] had called her to set up the appointment.

## 2021-10-05 NOTE — Telephone Encounter (Signed)
I spoke with [name] with [facility] to check on the appointment status since [name] hasn't heard back. Dr Jeanne Ivan approved the second opinion appointment and they will reach out to her soon hopefully to set up the appointment. [name] is aware of this.

## 2021-10-06 ENCOUNTER — Encounter: Payer: Self-pay | Admitting: Internal Medicine

## 2021-10-10 ENCOUNTER — Ambulatory Visit: Payer: Managed Care, Other (non HMO) | Attending: General Surgery

## 2021-10-10 VITALS — Wt 130.4 lb

## 2021-10-10 DIAGNOSIS — Z483 Aftercare following surgery for neoplasm: Secondary | ICD-10-CM | POA: Insufficient documentation

## 2021-10-10 NOTE — Therapy (Signed)
OUTPATIENT PHYSICAL THERAPY SOZO SCREENING NOTE   Patient Name: [name] MRN: [id number] DOB:[birth year], 46 y.o., female Today's Date: 10/10/2021  PCP: Merryl Hacker, No REFERRING PROVIDER: [name], MD   PT End of Session - 10/10/21 1614     Visit Number 2   # unchanged due to screen only   PT Start Time 6433    PT Stop Time 2951    PT Time Calculation (min) 6 min    Activity Tolerance Patient tolerated treatment well    Behavior During Therapy [facility] for tasks assessed/performed             Past Medical History:  Diagnosis Date   Cancer ([place])    Colon stricture ([place]) 05/07/2019   Crohn's colitis, with intestinal obstruction ([place]) 09/17/2013   Dx 2006 - right and left colon involved 2011 - pan-colitis 09/17/2013 left colitis 60-20 cm March 2021 subtotal colectomy ileosigmoid anastomosis for colonic strictures-Dr. Marcello Moores     Crohn's disease [facility])    Family history of breast cancer 06/11/2020   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 10/17/2010   Rectal bleeding    Uterine fibroid    Vitamin D deficiency 09/24/2013   Past Surgical History:  Procedure Laterality Date   AXILLARY LYMPH NODE DISSECTION Right 11/09/2020   Procedure: RIGHT AXILLARY LYMPH NODE DISSECTION;  Surgeon: [name], MD;  Location: [place];  Service: General;  Laterality: Right;   BIOPSY  07/01/2020   Procedure: BIOPSY;  Surgeon: Yetta Flock, MD;  [facility];  Service: Gastroenterology;;   BIOPSY  01/27/2021   Procedure: BIOPSY;  Surgeon: Yetta Flock, MD;  [facility];  Service: Gastroenterology;;   BIOPSY  09/07/2021   Procedure: BIOPSY;  Surgeon: Jackquline Denmark, MD;  [facility];  Service: Gastroenterology;;   BREAST RECONSTRUCTION WITH PLACEMENT OF TISSUE EXPANDER AND FLEX HD (ACELLULAR HYDRATED DERMIS) Bilateral 11/09/2020   Procedure: BILATERAL BREAST RECONSTRUCTION WITH PLACEMENT OF  TISSUE EXPANDER AND FLEX HD (ACELLULAR HYDRATED DERMIS);  Surgeon: [name], MD;  Location: [place];  Service: Plastics;  Laterality: Bilateral;   CAPSULOTOMY Bilateral 07/25/2021   Procedure: CAPSULOTOMY;  Surgeon: [name], MD;  Location: [place];  Service: Plastics;  Laterality: Bilateral;   COLECTOMY  06/27/2019   COLONOSCOPY  2015   FLEXIBLE SIGMOIDOSCOPY N/A 07/01/2020   Procedure: FLEXIBLE SIGMOIDOSCOPY;  Surgeon: Yetta Flock, MD;  [facility];  Service: Gastroenterology;  Laterality: N/A;   FLEXIBLE SIGMOIDOSCOPY N/A 01/27/2021   Procedure: FLEXIBLE SIGMOIDOSCOPY;  Surgeon: Yetta Flock, MD;  [facility];  Service: Gastroenterology;  Laterality: N/A;   FLEXIBLE SIGMOIDOSCOPY N/A 09/07/2021   Procedure: FLEXIBLE SIGMOIDOSCOPY;  Surgeon: Jackquline Denmark, MD;  [facility];  Service: Gastroenterology;  Laterality: N/A;   NIPPLE SPARING MASTECTOMY Bilateral 11/09/2020   Procedure: BILATERAL NIPPLE SPARING MASTECTOMY;  Surgeon: [name], MD;  Location: [place];  Service: General;  Laterality: Bilateral;   PORT-A-CATH REMOVAL N/A 07/25/2021   Procedure: REMOVAL PORT-A-CATH;  Surgeon: [name], MD;  Location: [place];  Service: Plastics;  Laterality: N/A;   PORTACATH PLACEMENT N/A 06/23/2020   Procedure: INSERTION PORT-A-CATH;  Surgeon: [name], MD;  Location: [place];  Service: General;  Laterality: N/A;  START TIME OF 11:00 AM FOR 60 MINUTES [place] IQ   REMOVAL OF BILATERAL TISSUE EXPANDERS WITH PLACEMENT OF BILATERAL BREAST IMPLANTS Bilateral 07/25/2021  Procedure: REMOVAL OF BILATERAL TISSUE EXPANDERS WITH PLACEMENT OF BILATERAL BREAST IMPLANTS;  Surgeon: [name], MD;  Location: [place];  Service: Plastics;  Laterality: Bilateral;  1.5   Patient Active Problem List   Diagnosis Date Noted    Leucocytosis 05/27/2021   Crohn's disease of small and large intestines with complication ([place])    SBO (small bowel obstruction) ([place]) 01/26/2021   History of Crohn's disease    Breast cancer ([place]) 11/09/2020   Port-A-Cath in place 07/15/2020   Genetic testing 06/25/2020   Family history of breast cancer 06/11/2020   Malignant neoplasm of upper-outer quadrant of right breast in female, estrogen receptor positive ([place]) 06/04/2020   Abnormal finding present on diagnostic imaging of uterus 05/07/2019   Vitamin D deficiency 09/24/2013   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 10/17/2010    REFERRING DIAG: right breast cancer at risk for lymphedema  THERAPY DIAG:  Aftercare following surgery for neoplasm  PERTINENT HISTORY: Patient was diagnosed on 06/04/2020 with right grade III invasive ductal carcinoma breast cancer. She neoadjuvant chemotherapy from 06/25/2020 - 10/29/2020 followed by bilateral mastectomies with expanders placed on 11/09/2020 with 0/12 axillary nodes positive for cancer. It is ER positive, PR negative, and HER2 negative with a Ki67 of 40%.  PRECAUTIONS: right UE Lymphedema risk, None  SUBJECTIVE: Pt returns for her 1 month SOZO screen after testing outside of baseline. "I've worn my compression sleeve every day. I had noticed some fullness in my axilla initially but this improved over the past month.   PAIN:  Are you having pain? No  SOZO SCREENING: Patient was assessed today using the SOZO machine to determine the lymphedema index score. This was compared to her baseline score. It was determined that she is within the recommended range when compared to her baseline and no further action is needed at this time. She will continue SOZO screenings. These are done every 3 months for 2 years post operatively followed by every 6 months for 2 years, and then annually.      Otelia Limes, PTA 10/10/2021, 4:17 PM

## 2021-10-14 ENCOUNTER — Encounter: Payer: Self-pay | Admitting: Internal Medicine

## 2021-10-14 NOTE — Telephone Encounter (Signed)
Patient was seen at [facility] 10/13/2021, the consult note came and I will place it on Dr Celesta Aver desk.

## 2021-10-15 ENCOUNTER — Other Ambulatory Visit: Payer: Self-pay

## 2021-10-15 ENCOUNTER — Emergency Department (HOSPITAL_COMMUNITY)
Admission: EM | Admit: 2021-10-15 | Discharge: 2021-10-16 | Discharge status: Against Medical Advice | Payer: Managed Care, Other (non HMO) | Attending: Emergency Medicine | Admitting: Emergency Medicine

## 2021-10-15 ENCOUNTER — Encounter (HOSPITAL_COMMUNITY): Payer: Self-pay

## 2021-10-15 DIAGNOSIS — K5900 Constipation, unspecified: Secondary | ICD-10-CM | POA: Insufficient documentation

## 2021-10-15 DIAGNOSIS — Z5321 Procedure and treatment not carried out due to patient leaving prior to being seen by health care provider: Secondary | ICD-10-CM | POA: Diagnosis not present

## 2021-10-15 LAB — CBC WITH DIFFERENTIAL/PLATELET
Abs Immature Granulocytes: 0.04 10*3/uL (ref 0.00–0.07)
Basophils Absolute: 0 10*3/uL (ref 0.0–0.1)
Basophils Relative: 0 %
Eosinophils Absolute: 0 10*3/uL (ref 0.0–0.5)
Eosinophils Relative: 0 %
HCT: 39.4 % (ref 36.0–46.0)
Hemoglobin: 12.4 g/dL (ref 12.0–15.0)
Immature Granulocytes: 0 %
Lymphocytes Relative: 5 %
Lymphs Abs: 0.5 10*3/uL — ABNORMAL LOW (ref 0.7–4.0)
MCH: 27.3 pg (ref 26.0–34.0)
MCHC: 31.5 g/dL (ref 30.0–36.0)
MCV: 86.8 fL (ref 80.0–100.0)
Monocytes Absolute: 0.3 10*3/uL (ref 0.1–1.0)
Monocytes Relative: 3 %
Neutro Abs: 10.9 10*3/uL — ABNORMAL HIGH (ref 1.7–7.7)
Neutrophils Relative %: 92 %
Platelets: 314 10*3/uL (ref 150–400)
RBC: 4.54 MIL/uL (ref 3.87–5.11)
RDW: 14.4 % (ref 11.5–15.5)
WBC: 11.8 10*3/uL — ABNORMAL HIGH (ref 4.0–10.5)
nRBC: 0 % (ref 0.0–0.2)

## 2021-10-15 LAB — COMPREHENSIVE METABOLIC PANEL
ALT: 25 U/L (ref 0–44)
AST: 19 U/L (ref 15–41)
Albumin: 3.8 g/dL (ref 3.5–5.0)
Alkaline Phosphatase: 86 U/L (ref 38–126)
Anion gap: 11 (ref 5–15)
BUN: 14 mg/dL (ref 6–20)
CO2: 26 mmol/L (ref 22–32)
Calcium: 9.5 mg/dL (ref 8.9–10.3)
Chloride: 100 mmol/L (ref 98–111)
Creatinine, Ser: 1 mg/dL (ref 0.44–1.00)
GFR, Estimated: >60 mL/min (ref >60)
Glucose, Bld: 138 mg/dL — ABNORMAL HIGH (ref 70–99)
Potassium: 3.9 mmol/L (ref 3.5–5.1)
Sodium: 137 mmol/L (ref 135–145)
Total Bilirubin: 0.8 mg/dL (ref 0.3–1.2)
Total Protein: 7.6 g/dL (ref 6.5–8.1)

## 2021-10-15 LAB — I-STAT BETA HCG BLOOD, ED (MC, WL, AP ONLY): I-stat hCG, quantitative: 5.8 m[IU]/mL — ABNORMAL HIGH (ref <5)

## 2021-10-15 LAB — LIPASE, BLOOD: Lipase: 29 U/L (ref 11–51)

## 2021-10-15 NOTE — ED Provider Triage Note (Signed)
Emergency Medicine Provider Triage Evaluation Note  [name] , a 46 y.o. female  was evaluated in triage.  Pt complains of abdominal distention and constipation for the past week. No nausea, or vomiting. The patient has a very small bowel movement today after a suppository. The patient has a h/o Crohns and recurrent SBOs.   Review of Systems  Positive:  Negative:   Physical Exam  BP (!) 142/106   Pulse (!) 106   Temp 98.5 F (36.9 C) (Oral)   Resp 18   SpO2 95%  Gen:   Awake, no distress   Resp:  Normal effort  MSK:   Moves extremities without difficulty  Other:  Abdomen distended, but pliable. NBS.   Medical Decision Making  Medically screening exam initiated at 6:42 PM.  Appropriate orders placed.  [name] was informed that the remainder of the evaluation will be completed by another provider, this initial triage assessment does not replace that evaluation, and the importance of remaining in the ED until their evaluation is complete.  Labs and CT imaging ordered.    Sherrell Puller, [place] 10/15/21 1845

## 2021-10-15 NOTE — ED Triage Notes (Signed)
Pt c/o constipation x5 days. Pt started taking Miralax yesterday and a suppository this morning, states she has not had a big bowel movement yet. Pt has hx of Crohn's, hx of small bowel obstructions. Pt denies N/V.

## 2021-10-17 ENCOUNTER — Encounter: Payer: Self-pay | Admitting: Internal Medicine

## 2021-10-17 ENCOUNTER — Telehealth: Payer: Self-pay | Admitting: Internal Medicine

## 2021-10-17 NOTE — Telephone Encounter (Signed)
Pt stated that she is having some issues with Constipation: Pt stated her last BM was last Thursday after a fleets enema: Pt stated that she has been taking Miralx daily: Pt was recommended to Do a MiraLAX purge: Instructions provided: Pt verbalized understanding with all questions answered.

## 2021-10-17 NOTE — Telephone Encounter (Signed)
Inbound call from patient stating that she has been constipated for almost a week and   miralax isn't providing a lot of relief. Patient is seeking advice on laxative she can take to help. Please advise.

## 2021-10-19 ENCOUNTER — Other Ambulatory Visit: Payer: Self-pay | Admitting: Internal Medicine

## 2021-10-19 ENCOUNTER — Telehealth: Payer: Self-pay | Admitting: Internal Medicine

## 2021-10-19 DIAGNOSIS — E349 Endocrine disorder, unspecified: Secondary | ICD-10-CM

## 2021-10-19 NOTE — Progress Notes (Signed)
Patient with Crohn's disease, plan is to start Entyvio.  QuantiFERON and hepatitis B testing are negative. She had a barely positive serum hCG.  She is not sexually active she is not menstruating but she has not been related to breast cancer chemotherapy.  We will recheck the serum hCG.

## 2021-10-19 NOTE — Telephone Encounter (Signed)
Left message for pt to call back  °

## 2021-10-19 NOTE — Telephone Encounter (Signed)
I have entered orders for her to start Entyvio at the [facility] infusion center on [address].  I was notified that she had an abnormal hCG test and she had a serum hCG of 5.8 which is above the threshold of 5.  Technically this is a potential contraindication for the therapy.  [name] says she is not sexually active and does not think she is pregnant.  She is not having periods currently related to breast cancer chemotherapy.  She was told it would take months for them to return.  We have decided to repeat the hCG test.  This order is pended.  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  Please help accomplish this.

## 2021-10-20 ENCOUNTER — Emergency Department (HOSPITAL_COMMUNITY): Payer: Managed Care, Other (non HMO)

## 2021-10-20 ENCOUNTER — Other Ambulatory Visit: Payer: Self-pay

## 2021-10-20 ENCOUNTER — Encounter: Payer: Self-pay | Admitting: Internal Medicine

## 2021-10-20 ENCOUNTER — Encounter (HOSPITAL_COMMUNITY): Payer: Self-pay

## 2021-10-20 ENCOUNTER — Emergency Department (HOSPITAL_COMMUNITY)
Admission: EM | Admit: 2021-10-20 | Discharge: 2021-10-20 | Disposition: A | Discharge status: Home / Self Care | Payer: Managed Care, Other (non HMO) | Attending: Emergency Medicine | Admitting: Emergency Medicine

## 2021-10-20 DIAGNOSIS — R109 Unspecified abdominal pain: Secondary | ICD-10-CM | POA: Diagnosis present

## 2021-10-20 DIAGNOSIS — K5900 Constipation, unspecified: Secondary | ICD-10-CM | POA: Insufficient documentation

## 2021-10-20 LAB — COMPREHENSIVE METABOLIC PANEL
ALT: 21 U/L (ref 0–44)
AST: 17 U/L (ref 15–41)
Albumin: 3.7 g/dL (ref 3.5–5.0)
Alkaline Phosphatase: 87 U/L (ref 38–126)
Anion gap: 11 (ref 5–15)
BUN: 10 mg/dL (ref 6–20)
CO2: 28 mmol/L (ref 22–32)
Calcium: 9.3 mg/dL (ref 8.9–10.3)
Chloride: 101 mmol/L (ref 98–111)
Creatinine, Ser: 1.04 mg/dL — ABNORMAL HIGH (ref 0.44–1.00)
GFR, Estimated: >60 mL/min (ref >60)
Glucose, Bld: 102 mg/dL — ABNORMAL HIGH (ref 70–99)
Potassium: 3 mmol/L — ABNORMAL LOW (ref 3.5–5.1)
Sodium: 140 mmol/L (ref 135–145)
Total Bilirubin: 0.7 mg/dL (ref 0.3–1.2)
Total Protein: 7.2 g/dL (ref 6.5–8.1)

## 2021-10-20 LAB — CBC WITH DIFFERENTIAL/PLATELET
Abs Immature Granulocytes: 0.02 10*3/uL (ref 0.00–0.07)
Basophils Absolute: 0 10*3/uL (ref 0.0–0.1)
Basophils Relative: 0 %
Eosinophils Absolute: 0 10*3/uL (ref 0.0–0.5)
Eosinophils Relative: 0 %
HCT: 38.7 % (ref 36.0–46.0)
Hemoglobin: 12.2 g/dL (ref 12.0–15.0)
Immature Granulocytes: 0 %
Lymphocytes Relative: 14 %
Lymphs Abs: 1.4 10*3/uL (ref 0.7–4.0)
MCH: 26.9 pg (ref 26.0–34.0)
MCHC: 31.5 g/dL (ref 30.0–36.0)
MCV: 85.4 fL (ref 80.0–100.0)
Monocytes Absolute: 0.7 10*3/uL (ref 0.1–1.0)
Monocytes Relative: 7 %
Neutro Abs: 8.1 10*3/uL — ABNORMAL HIGH (ref 1.7–7.7)
Neutrophils Relative %: 79 %
Platelets: 298 10*3/uL (ref 150–400)
RBC: 4.53 MIL/uL (ref 3.87–5.11)
RDW: 14.3 % (ref 11.5–15.5)
WBC: 10.3 10*3/uL (ref 4.0–10.5)
nRBC: 0 % (ref 0.0–0.2)

## 2021-10-20 LAB — URINALYSIS, ROUTINE W REFLEX MICROSCOPIC
Bilirubin Urine: NEGATIVE
Glucose, UA: NEGATIVE mg/dL
Hgb urine dipstick: NEGATIVE
Ketones, ur: NEGATIVE mg/dL
Leukocytes,Ua: NEGATIVE
Nitrite: NEGATIVE
Protein, ur: NEGATIVE mg/dL
Specific Gravity, Urine: 1.016 (ref 1.005–1.030)
pH: 5 (ref 5.0–8.0)

## 2021-10-20 LAB — HCG, QUANTITATIVE, PREGNANCY: hCG, Beta Chain, Quant, S: 5 m[IU]/mL — ABNORMAL HIGH (ref <5)

## 2021-10-20 LAB — LIPASE, BLOOD: Lipase: 26 U/L (ref 11–51)

## 2021-10-20 LAB — I-STAT BETA HCG BLOOD, ED (MC, WL, AP ONLY): I-stat hCG, quantitative: 7.6 m[IU]/mL — ABNORMAL HIGH (ref <5)

## 2021-10-20 MED ORDER — IOHEXOL 300 MG/ML  SOLN
100.0000 mL | Freq: Once | INTRAMUSCULAR | Status: AC | PRN
  Administered 2021-10-20: 100 mL via INTRAVENOUS

## 2021-10-20 MED ORDER — SODIUM CHLORIDE 0.9 % IV BOLUS
1000.0000 mL | Freq: Once | INTRAVENOUS | Status: AC
  Administered 2021-10-20: 1000 mL via INTRAVENOUS

## 2021-10-20 NOTE — Telephone Encounter (Signed)
Pt notified of Dr. Carlean Purl recommendations to have repeat lab done. Pt questioned if she would like it done at a particular [employer] site. Pt stated that she could just do it at our lab: Location to Lab Given. Pt verbalized understanding with all questions answered.

## 2021-10-20 NOTE — ED Provider Triage Note (Signed)
Emergency Medicine Provider Triage Evaluation Note  [name] , a 46 y.o. female  was evaluated in triage.  Pt complains of constipation, abdominal pain.  Patient reports has history of Crohn's disease, recurrent small bowel obstructions.  Patient reports last small bowel obstruction was 1 month ago.  Patient states that she has been unable to have successful bowel movement for the last 2 weeks.  Patient states that she was seen on 7/8 here and advised to take MiraLAX and Gatorade.  Patient states that she has done this with minimal relief of symptoms.  Patient states she is still passing gas however this is consistent for small bowel obstructions for her.  The patient states that with small bowel obstruction she is typically able to pass small amounts of stool.  Patient unsure last menstrual period.  Patient states that she has been going through chemotherapy over the last 1 year.  Patient states that she is sexually active.  Review of Systems  Positive:  Negative:   Physical Exam  BP (!) 165/112 (BP Location: Left Arm)   Pulse (!) 104   Temp 98.5 F (36.9 C) (Oral)   Resp 16   SpO2 98%  Gen:   Awake, no distress   Resp:  Normal effort  MSK:   Moves extremities without difficulty  Other:  Distended abdomen  Medical Decision Making  Medically screening exam initiated at 1:20 PM.  Appropriate orders placed.  KAMLA SKILTON was informed that the remainder of the evaluation will be completed by another provider, this initial triage assessment does not replace that evaluation, and the importance of remaining in the ED until their evaluation is complete.     Azucena Cecil, PA-C 10/20/21 1321

## 2021-10-20 NOTE — ED Triage Notes (Signed)
Pt c/o constipation and extreme abdominal pain for over a week. Pt states she was seen here at [facility] ED 7/8 for same and its gotten worse. Pt states she was told to take Miralax in gatorade and she did that with no relief. Pt has hx of Crohn's, hx of small bowel obstructions. Pt denies emesis but has had some nausea off and on for last few days.

## 2021-10-20 NOTE — Telephone Encounter (Signed)
Patient returned your call, please advise. 

## 2021-10-20 NOTE — Discharge Instructions (Signed)
Continue MiraLAX daily.  Continue steroids.  Please return if you develop fever, worsening abdominal pain, nausea and vomiting as discussed.  Follow-up with OB/GYN tomorrow for repeat HCG test.

## 2021-10-20 NOTE — Progress Notes (Signed)
[name]  Feb 14, 1976 [id number]  Patient Care Team: Pcp, No as PCP - General [name], RN as Oncology Nurse Navigator [name], RN as Oncology Nurse Navigator [name], MD as Consulting Physician (Colon and Rectal Surgery) [name], MD as Consulting Physician (Gastroenterology) [name], MD as Consulting Physician (General Surgery) [name], MD as Consulting Physician (Hematology and Oncology)  Reason for call: Abnormal CT scan of abdomen.   Called by Dr. Ronnald Nian at [facility] emergency department for concerns of abnormal CAT scan.  46 year old with Crohn's disease.  Required abdominal colectomy with ileosigmoid anastomosis for stricturing in January 21.  Followed by [facility] gastrology.  Try to control with steroids but felt to have progression.  Had flexible sigmoidoscopy 6 weeks ago that showed active colitis in sigmoid colon near the anastomosis.  Plan was to start Entyvio immunosuppression to try and get her Crohn's under better control..  Patient having some worsening distention and cramping in constipation.  Question of abnormal hCG/pregnancy testing.  With cramping recommend to go to emergency department.  CAT scan shows worsening dilatation with some fecalization suspicious for at least partial obstruction.  In reviewing the films the patient is rather dilated with some fecalization concerning for worsening obstruction.  Patient is afebrile.  Not tachycardic nor in shock.  No leukocytosis.  She is distended but denies any major pain.  ER does not think that she has peritonitis.  She does not have any obvious abscess nor fistula.  The patient really wants to go home.  Recommend they reach back out to gastrology to see what the plan is for the Entyvio to sort out.  Sounds like they have reached out to OB/GYN.  They suspect the laboratory values a false positive and to repeat it tomorrow.  Will defer to them.  If patient ends up getting  admitted, surgery can be available for emergent issues but at this point does not sound like she has peritonitis, there is no evidence of free air perforation pneumatosis.  She is not acting in shock.  The treatment for her obstruction is appropriate Crohn's treatment, not surgical intervention as the first-line option.  Hopefully appropriate immunosuppression will allow the inflammation to calm down and the obstruction to resolve once inflammation is under control.  We will reserve surgical intervention until all other options have been exhausted.  Dr. Ronnald Nian  Patient Active Problem List   Diagnosis Date Noted   Crohn's disease of small and large intestines with complication ([place])     Priority: Medium    Leucocytosis 05/27/2021   SBO (small bowel obstruction) ([facility]) 01/26/2021   History of Crohn's disease    Breast cancer ([place]) 11/09/2020   Port-A-Cath in place 07/15/2020   Genetic testing 06/25/2020   Family history of breast cancer 06/11/2020   Malignant neoplasm of upper-outer quadrant of right breast in female, estrogen receptor positive ([place]) 06/04/2020   Abnormal finding present on diagnostic imaging of uterus 05/07/2019   Vitamin D deficiency 09/24/2013   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 10/17/2010    Past Medical History:  Diagnosis Date   Cancer ([place])    Colon stricture ([place]) 05/07/2019   Crohn's colitis, with intestinal obstruction ([place]) 09/17/2013   Dx 2006 - right and left colon involved 2011 - pan-colitis 09/17/2013 left colitis 60-20 cm March 2021 subtotal colectomy ileosigmoid anastomosis for colonic strictures-Dr. Marcello Moores     Crohn's disease [facility])    Family history of  breast cancer 06/11/2020   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 10/17/2010   Rectal bleeding    Uterine fibroid    Vitamin D deficiency 09/24/2013    Past Surgical History:  Procedure Laterality Date   AXILLARY LYMPH NODE  DISSECTION Right 11/09/2020   Procedure: RIGHT AXILLARY LYMPH NODE DISSECTION;  Surgeon: [name], MD;  Location: [facility];  Service: General;  Laterality: Right;   BIOPSY  07/01/2020   Procedure: BIOPSY;  Surgeon: Yetta Flock, MD;  Location: [facility];  Service: Gastroenterology;;   BIOPSY  01/27/2021   Procedure: BIOPSY;  Surgeon: Yetta Flock, MD;  [facility]: [facility];  Service: Gastroenterology;;   BIOPSY  09/07/2021   Procedure: BIOPSY;  Surgeon: Jackquline Denmark, MD;  Location: [facility];  Service: Gastroenterology;;   BREAST RECONSTRUCTION WITH PLACEMENT OF TISSUE EXPANDER AND FLEX HD (ACELLULAR HYDRATED DERMIS) Bilateral 11/09/2020   Procedure: BILATERAL BREAST RECONSTRUCTION WITH PLACEMENT OF TISSUE EXPANDER AND FLEX HD (ACELLULAR HYDRATED DERMIS);  Surgeon: [name], MD;  Location: [facility];  Service: Plastics;  Laterality: Bilateral;   CAPSULOTOMY Bilateral 07/25/2021   Procedure: CAPSULOTOMY;  Surgeon: [name], MD;  Location: [facility];  Service: Plastics;  Laterality: Bilateral;   COLECTOMY  06/27/2019   COLONOSCOPY  2015   FLEXIBLE SIGMOIDOSCOPY N/A 07/01/2020   Procedure: FLEXIBLE SIGMOIDOSCOPY;  Surgeon: Yetta Flock, MD;  [facility];  Service: Gastroenterology;  Laterality: N/A;   FLEXIBLE SIGMOIDOSCOPY N/A 01/27/2021   Procedure: FLEXIBLE SIGMOIDOSCOPY;  Surgeon: Yetta Flock, MD;  [facility];  Service: Gastroenterology;  Laterality: N/A;   FLEXIBLE SIGMOIDOSCOPY N/A 09/07/2021   Procedure: FLEXIBLE SIGMOIDOSCOPY;  Surgeon: Jackquline Denmark, MD;  [facility];  Service: Gastroenterology;  Laterality: N/A;   NIPPLE SPARING MASTECTOMY Bilateral 11/09/2020   Procedure: BILATERAL NIPPLE SPARING MASTECTOMY;  Surgeon: [name], MD;  Location: [place];  Service: General;  Laterality: Bilateral;   PORT-A-CATH REMOVAL N/A  07/25/2021   Procedure: REMOVAL PORT-A-CATH;  Surgeon: [name], MD;  Location: [place];  Service: Plastics;  Laterality: N/A;   PORTACATH PLACEMENT N/A 06/23/2020   Procedure: INSERTION PORT-A-CATH;  Surgeon: [name], MD;  Location: [place];  Service: General;  Laterality: N/A;  START TIME OF 11:00 AM FOR 60 MINUTES [place] [facility]   REMOVAL OF BILATERAL TISSUE EXPANDERS WITH PLACEMENT OF BILATERAL BREAST IMPLANTS Bilateral 07/25/2021   Procedure: REMOVAL OF BILATERAL TISSUE EXPANDERS WITH PLACEMENT OF BILATERAL BREAST IMPLANTS;  Surgeon: [name], MD;  Location: [place];  Service: Plastics;  Laterality: Bilateral;  1.5    Social History   Socioeconomic History   Marital status: Significant Other    Spouse name: Not on file   Number of children: 1   Years of education: 41   Highest education level: Not on file  Occupational History   Occupation: Medical Coder   Tobacco Use   Smoking status: Former    Types: Cigarettes   Smokeless tobacco: Never  Vaping Use   Vaping Use: Never used  Substance and Sexual Activity   Alcohol use: Yes    Alcohol/week: 1.0 - 2.0 standard drink of alcohol    Types: 1 - 2 Glasses of wine per week   Drug use: No   Sexual activity: Yes    Partners: Male    Comment: Intermittent protection not consistent  Other Topics Concern  Not on file  Social History Narrative   Single, 1 daughter born 2001 approximately   Medical coder for [employer]   She is a former smoker, occasional alcohol no drug use   Social Determinants of Radio broadcast assistant Strain: Not on file  Food Insecurity: Not on file  Transportation Needs: Not on file  Physical Activity: Not on file  Stress: Not on file  Social Connections: Not on file  Intimate Partner Violence: Not At Risk (11/25/2020)   Humiliation, Afraid, Rape, and Kick questionnaire    Fear of Current or Ex-Partner: No    Emotionally  Abused: No    Physically Abused: No    Sexually Abused: No    Family History  Problem Relation Age of Onset   Diabetes Father    Breast cancer Maternal Aunt        dx unknown age   Diabetes Paternal Aunt    Breast cancer Cousin        maternal cousin; dx unknown age   Esophageal cancer Neg Hx    Rectal cancer Neg Hx    Stomach cancer Neg Hx    Colon polyps Neg Hx    Colon cancer Neg Hx     No current facility-administered medications for this encounter.   Current Outpatient Medications  Medication Sig Dispense Refill   acetaminophen (TYLENOL) 500 MG tablet Take 500-1,000 mg by mouth every 6 (six) hours as needed for mild pain.     Ferrous Sulfate (IRON PO) Take 1 tablet by mouth daily.     mesalamine (LIALDA) 1.2 g EC tablet Take 2 tablets (2.4 g total) by mouth 2 (two) times daily with a meal. 120 tablet 11   Multiple Vitamins-Minerals (MULTIVITAMIN WITH MINERALS) tablet Take 1 tablet by mouth daily.     predniSONE (DELTASONE) 10 MG tablet Take 2 tablets (20 mg total) by mouth daily with breakfast. Stay at this dose until you return to Dr. Carlean Purl 100 tablet 0   predniSONE (DELTASONE) 20 MG tablet Take 2 tabs (40 mg total) daily x7 days, then 1.5 tabs (30 mg total) daily x7 days, then 1 tab (20 Mg total) daily thereafter until follow-up with your gastroenterologist. 60 tablet 0   vitamin B-12 (CYANOCOBALAMIN) 100 MCG tablet Take 100 mcg by mouth daily.       Allergies  Allergen Reactions   Nsaids Other (See Comments)    Crohns disease/ IBD   Other     BLOOD PRODUCT REFUSAL    BP (!) 149/101   Pulse 91   Temp 98.5 F (36.9 C) (Oral)   Resp 16   SpO2 100%   CT Abdomen Pelvis W Contrast  Result Date: 10/20/2021 CLINICAL DATA:  Bowel obstruction suspected.  Abdominal pain EXAM: CT ABDOMEN AND PELVIS WITH CONTRAST TECHNIQUE: Multidetector CT imaging of the abdomen and pelvis was performed using the standard protocol following bolus administration of intravenous contrast.  RADIATION DOSE REDUCTION: This exam was performed according to the departmental dose-optimization program which includes automated exposure control, adjustment of the mA and/or kV according to patient size and/or use of iterative reconstruction technique. CONTRAST:  146m OMNIPAQUE IOHEXOL 300 MG/ML  SOLN COMPARISON:  09/05/2021 FINDINGS: Lower chest: No acute abnormality Hepatobiliary: No focal hepatic abnormality. Gallbladder unremarkable. Pancreas: No focal abnormality or ductal dilatation. Spleen: No focal abnormality.  Normal size. Adrenals/Urinary Tract: No adrenal abnormality. No focal renal abnormality. No stones or hydronephrosis. Urinary bladder is unremarkable. Stomach/Bowel: Changes of subtotal colectomy. Markedly dilated small  bowel loops with air-fluid levels and fecalization. Small bowel loops measure up to 5.3 cm in diameter. Transition is at the ileo sigmoid anastomosis. Stomach is decompressed, grossly unremarkable. Vascular/Lymphatic: No evidence of aneurysm or adenopathy. Reproductive: Enlarged uterus with multiple fibroids, unchanged. Other: No free fluid or free air. Musculoskeletal: No acute bony abnormality. IMPRESSION: Prior subtotal colectomy. Markedly dilated small bowel loops with air-fluid levels and fecalization. Transition at the ileosigmoid anastomosis. Findings compatible with recurrent small bowel obstruction. Electronically Signed   By: Rolm Baptise M.D.   On: 10/20/2021 17:21    Note:  This dictation was prepared with Dragon/digital dictation along with [employer]. Any transcriptional errors that result from this process are unintentional.    Adin Hector, MD, FACS, MASCRS Esophageal, Gastrointestinal & Colorectal Surgery Robotic and Minimally Invasive Surgery  [facility] [address], [place], [place] [address] [phone] Fax [phone] Main  CONTACT INFORMATION:  Weekday  (9AM-5PM): Call [facility] main office at [phone]  Weeknight (5PM-9AM) or Weekend/Holiday: Check [email or web address] (password " [email or web address]") for General Surgery [facility] coverage  (Please, do not use SecureChat as it is not reliable communication to operating surgeons for immediate patient care)      10/20/2021  5:59 PM

## 2021-10-20 NOTE — ED Provider Notes (Signed)
[facility] DEPT Provider Note   CSN: [id number] Arrival date & time: 10/20/21  1300     History  Chief Complaint  Patient presents with   Abdominal Pain   Constipation    [name] is a 46 y.o. female.  Patient with history of Crohn's, bowel obstruction presents with abdominal pain and constipation.  Use MiraLAX 2 days ago and had some bowel movement yesterday but still with some cramping pain.  Denies any vaginal bleeding or vaginal discharge.  She had a mildly positive blood pregnancy test with GI doctor but had a negative pregnancy test via urine yesterday.  She has not had a menstrual cycle in over a year since she was treated with chemo and immunologic's for breast cancer.  She did have unprotected sex last month.  She states nothing makes it worse or better.  She denies any nausea, vomiting.  She is passing gas.  But she is having hard stools.  Did try an enema.  The history is provided by the patient.       Home Medications Prior to Admission medications   Medication Sig Start Date End Date Taking? Authorizing Provider  acetaminophen (TYLENOL) 500 MG tablet Take 500-1,000 mg by mouth every 6 (six) hours as needed for mild pain.    [provider]  Ferrous Sulfate (IRON PO) Take 1 tablet by mouth daily.    [provider]  mesalamine (LIALDA) 1.2 g EC tablet Take 2 tablets (2.4 g total) by mouth 2 (two) times daily with a meal. 07/22/20   [name], MD  Multiple Vitamins-Minerals (MULTIVITAMIN WITH MINERALS) tablet Take 1 tablet by mouth daily.    [provider]  predniSONE (DELTASONE) 10 MG tablet Take 2 tablets (20 mg total) by mouth daily with breakfast. Stay at this dose until you return to Dr. Carlean Purl 09/14/21   [name], MD  predniSONE (DELTASONE) 20 MG tablet Take 2 tabs (40 mg total) daily x7 days, then 1.5 tabs (30 mg total) daily x7 days, then 1 tab (20 Mg total) daily thereafter until  follow-up with your gastroenterologist. 09/08/21   Modena Jansky, MD  vitamin B-12 (CYANOCOBALAMIN) 100 MCG tablet Take 100 mcg by mouth daily.    [provider]  prochlorperazine (COMPAZINE) 10 MG tablet Take 1 tablet (10 mg total) by mouth every 6 (six) hours as needed (Nausea or vomiting). 06/16/20 11/19/20  Benay Pike, MD      Allergies    Nsaids and Other    Review of Systems   Review of Systems  Physical Exam Updated Vital Signs BP (!) 154/97   Pulse 93   Temp 98.5 F (36.9 C) (Oral)   Resp 15   SpO2 100%  Physical Exam Vitals and nursing note reviewed.  Constitutional:      General: She is not in acute distress.    Appearance: She is well-developed. She is not ill-appearing.  HENT:     Head: Normocephalic and atraumatic.     Mouth/Throat:     Mouth: Mucous membranes are moist.  Eyes:     Extraocular Movements: Extraocular movements intact.     Conjunctiva/sclera: Conjunctivae normal.     Pupils: Pupils are equal, round, and reactive to light.  Cardiovascular:     Rate and Rhythm: Normal rate and regular rhythm.     Heart sounds: Normal heart sounds. No murmur heard. Pulmonary:     Effort: Pulmonary effort is normal. No respiratory distress.  Breath sounds: Normal breath sounds.  Abdominal:     General: Bowel sounds are normal.     Palpations: Abdomen is soft.     Tenderness: There is generalized abdominal tenderness.  Musculoskeletal:        General: No swelling.     Cervical back: Neck supple.  Skin:    General: Skin is warm and dry.     Capillary Refill: Capillary refill takes less than 2 seconds.  Neurological:     Mental Status: She is alert.  Psychiatric:        Mood and Affect: Mood normal.     ED Results / Procedures / Treatments   Labs (all labs ordered are listed, but only abnormal results are displayed) Labs Reviewed  CBC WITH DIFFERENTIAL/PLATELET - Abnormal; Notable for the following components:      Result Value    Neutro Abs 8.1 (*)    All other components within normal limits  COMPREHENSIVE METABOLIC PANEL - Abnormal; Notable for the following components:   Potassium 3.0 (*)    Glucose, Bld 102 (*)    Creatinine, Ser 1.04 (*)    All other components within normal limits  URINALYSIS, ROUTINE W REFLEX MICROSCOPIC - Abnormal; Notable for the following components:   Bacteria, UA RARE (*)    All other components within normal limits  HCG, QUANTITATIVE, PREGNANCY - Abnormal; Notable for the following components:   hCG, Beta Chain, Quant, S 5 (*)    All other components within normal limits  I-STAT BETA HCG BLOOD, ED ([facility], [facility], [facility] ONLY) - Abnormal; Notable for the following components:   I-stat hCG, quantitative 7.6 (*)    All other components within normal limits  LIPASE, BLOOD    EKG None  Radiology CT Abdomen Pelvis W Contrast  Result Date: 10/20/2021 CLINICAL DATA:  Bowel obstruction suspected.  Abdominal pain EXAM: CT ABDOMEN AND PELVIS WITH CONTRAST TECHNIQUE: Multidetector CT imaging of the abdomen and pelvis was performed using the standard protocol following bolus administration of intravenous contrast. RADIATION DOSE REDUCTION: This exam was performed according to the departmental dose-optimization program which includes automated exposure control, adjustment of the mA and/or kV according to patient size and/or use of iterative reconstruction technique. CONTRAST:  152m OMNIPAQUE IOHEXOL 300 MG/ML  SOLN COMPARISON:  09/05/2021 FINDINGS: Lower chest: No acute abnormality Hepatobiliary: No focal hepatic abnormality. Gallbladder unremarkable. Pancreas: No focal abnormality or ductal dilatation. Spleen: No focal abnormality.  Normal size. Adrenals/Urinary Tract: No adrenal abnormality. No focal renal abnormality. No stones or hydronephrosis. Urinary bladder is unremarkable. Stomach/Bowel: Changes of subtotal colectomy. Markedly dilated small bowel loops with air-fluid levels and fecalization. Small  bowel loops measure up to 5.3 cm in diameter. Transition is at the ileo sigmoid anastomosis. Stomach is decompressed, grossly unremarkable. Vascular/Lymphatic: No evidence of aneurysm or adenopathy. Reproductive: Enlarged uterus with multiple fibroids, unchanged. Other: No free fluid or free air. Musculoskeletal: No acute bony abnormality. IMPRESSION: Prior subtotal colectomy. Markedly dilated small bowel loops with air-fluid levels and fecalization. Transition at the ileosigmoid anastomosis. Findings compatible with recurrent small bowel obstruction. Electronically Signed   By: KRolm BaptiseM.D.   On: 10/20/2021 17:21    Procedures Procedures    Medications Ordered in ED Medications  sodium chloride 0.9 % bolus 1,000 mL (1,000 mLs Intravenous New Bag/Given 10/20/21 1648)  iohexol (OMNIPAQUE) 300 MG/ML solution 100 mL (100 mLs Intravenous Contrast Given 10/20/21 1700)    ED Course/ Medical Decision Making/ A&P  Medical Decision Making Risk Prescription drug management.   JACELYN CUEN is here with abdominal pain, constipation.  History of Crohn's, bowel obstructions.  Symptoms for the last several days.  She did get minor improvement with MiraLAX yesterday but has not had a normal bowel movement.  She is passing gas.  No nausea or vomiting.  She is diffusely tender on exam but mostly cramping pain at times.  She did have a blood i-STAT hCG with GI about 5 days ago that was mildly elevated at 5.8.  Today it was rechecked and was 7.6.  A quantitative hCG was checked and was 5.  Patient has not had a menstrual cycle in over a year as she is a breast cancer patient in remission.  She finished chemotherapy maintenance immunologic therapy several months ago.  I talked with Dr. Ilda Basset with OB/GYN on the phone and overall thinks that this is mildly positive secondary to some other process.  Could be related to her treatments.  The fact that i-STAT has not doubled shows that  this is very unlikely to be pregnancy.  She is not having significant pain or abnormal vitals and very low suspicion for an ectopic process.  After talking with Dr. Ilda Basset and the patient we both believe the best course is to get a CT scan of the abdomen pelvis.  Patient is agreeable to this plan as benefits outweigh the risk.  Differential diagnosis includes bowel obstruction, constipation.  Very low suspicion for an ectopic.  Dr. Ilda Basset will arrange for follow-up tomorrow to recheck hCG provided unremarkable CT.  Lab work has already been collected otherwise with a CBC, CMP, urinalysis and per my review and interpretation are unremarkable.  No significant leukocytosis, anemia, electrolyte abnormality.  Patient will be given IV fluids and we will pursue CT scan abdomen pelvis.  CT scan was concerning for recurrent bowel obstruction.  However clinically patient not having nausea or vomiting.  She is passing gas.  Talked with Dr. Johney Maine with general surgery on the phone who reviewed images.  This seems to be fairly chronic dilation at this anastomosis site.  His suspicion is that this is likely Crohn's related inflammatory related.  Feels that if patient is not having uncontrollable nausea, vomiting, pain can be discharged with return precautions.  I talked with Dr. Lyndel Safe with gastroenterology who is also in agreement with this plan.  She is on 20 mg of prednisone currently with a plan to start new immunotherapy next week.  Can keep that current plan with close return precautions.  I have arranged for her to have OB follow-up tomorrow to follow her hCG.  Patient is comfortable with this plan.  She will follow-up with OB tomorrow.  She understands to return if symptoms get worse.  Patient did not want to be admitted.  She did not feel like she was having a bowel obstruction as she has not had any of her previous symptoms.  She states that she has been able to eat and drink without any issues.  Overall patient was  discharged.  This chart was dictated using voice recognition software.  Despite best efforts to proofread,  errors can occur which can change the documentation meaning.          Final Clinical Impression(s) / ED Diagnoses Final diagnoses:  Constipation, unspecified constipation type    Rx / DC Orders ED Discharge Orders     None         Lennice Sites, DO 10/20/21  1856  

## 2021-10-21 ENCOUNTER — Ambulatory Visit: Payer: Managed Care, Other (non HMO)

## 2021-10-25 ENCOUNTER — Ambulatory Visit (INDEPENDENT_AMBULATORY_CARE_PROVIDER_SITE_OTHER): Payer: Managed Care, Other (non HMO) | Admitting: Internal Medicine

## 2021-10-25 ENCOUNTER — Telehealth: Payer: Self-pay | Admitting: Pharmacy Technician

## 2021-10-25 ENCOUNTER — Encounter: Payer: Self-pay | Admitting: Internal Medicine

## 2021-10-25 ENCOUNTER — Other Ambulatory Visit (INDEPENDENT_AMBULATORY_CARE_PROVIDER_SITE_OTHER): Payer: Managed Care, Other (non HMO)

## 2021-10-25 VITALS — BP 120/84 | HR 103 | Ht 64.0 in | Wt 127.0 lb

## 2021-10-25 DIAGNOSIS — E010 Iodine-deficiency related diffuse (endemic) goiter: Secondary | ICD-10-CM

## 2021-10-25 DIAGNOSIS — K50112 Crohn's disease of large intestine with intestinal obstruction: Secondary | ICD-10-CM

## 2021-10-25 DIAGNOSIS — K56609 Unspecified intestinal obstruction, unspecified as to partial versus complete obstruction: Secondary | ICD-10-CM

## 2021-10-25 LAB — TSH: TSH: 2.82 u[IU]/mL (ref 0.35–5.50)

## 2021-10-25 LAB — VITAMIN B12: Vitamin B-12: 665 pg/mL (ref 211–911)

## 2021-10-25 LAB — MAGNESIUM: Magnesium: 1.7 mg/dL (ref 1.5–2.5)

## 2021-10-25 LAB — T4, FREE: Free T4: 1.07 ng/dL (ref 0.60–1.60)

## 2021-10-25 LAB — COMPREHENSIVE METABOLIC PANEL
ALT: 19 U/L (ref 0–35)
AST: 16 U/L (ref 0–37)
Albumin: 4.1 g/dL (ref 3.5–5.2)
Alkaline Phosphatase: 96 U/L (ref 39–117)
BUN: 11 mg/dL (ref 6–23)
CO2: 29 mEq/L (ref 19–32)
Calcium: 9.4 mg/dL (ref 8.4–10.5)
Chloride: 99 mEq/L (ref 96–112)
Creatinine, Ser: 1.05 mg/dL (ref 0.40–1.20)
GFR: 64.89 mL/min (ref >60.00)
Glucose, Bld: 92 mg/dL (ref 70–99)
Potassium: 3.1 mEq/L — ABNORMAL LOW (ref 3.5–5.1)
Sodium: 139 mEq/L (ref 135–145)
Total Bilirubin: 0.6 mg/dL (ref 0.2–1.2)
Total Protein: 7.4 g/dL (ref 6.0–8.3)

## 2021-10-25 NOTE — Telephone Encounter (Addendum)
Dr. Carlean Purl, [facility] note:  Auth Submission: APPROVED Payer: [employer] Medication & CPT/J Code(s) submitted: En[employer]vio (Vedolizumab) [phone] Route of submission (phone, fax, portal): PHONE: [phone] FAX: [phone] Auth type: Buy/Bill Units/visits requested: [place] Reference number: [id number] Approval from: 10/19/21 to 10/20/22    [employer] CO-PAY CARD: APPROVED ID: [id number] BIN: [id number]  PCN: [id number]  GR: [id number]  Patient will be scheduled as soon as possible.

## 2021-10-25 NOTE — Progress Notes (Unsigned)
[name] 46 y.o. 1975/06/23 [id number]  Assessment & Plan:   Encounter Diagnoses  Name Primary?   Crohn's colitis ? small bowel, with intestinal obstruction ([place]) Yes   SBO (small bowel obstruction) ([place])    Thyromegaly    Presumably her problems are related to Crohn's disease and stenosis at her ileorectal anastomosis.  I am struck by the persistence of dilation of her small bowel loops and that we at least on some of her endoscopic evaluations the stenosis was not that severe.  The scope passes so I am puzzled as to why she has this constipation problem.  Particularly in the setting of a subtotal colectomy and ileorectal anastomosis i.e. typically though the stool is very loose and should not back up as it has in her.  However this is the only abnormality we have so we will proceed with [facility] therapy.  She is excepting of that now.  This is scheduled to start in about 5 to 6 days.  Laboratory testing was performed and showed the following.  Lab Results  Component Value Date   CREATININE 1.05 10/25/2021   BUN 11 10/25/2021   NA 139 10/25/2021   K 3.1 (L) 10/25/2021   CL 99 10/25/2021   CO2 29 10/25/2021   Lab Results  Component Value Date   ALT 19 10/25/2021   AST 16 10/25/2021   ALKPHOS 96 10/25/2021   BILITOT 0.6 10/25/2021   Lab Results  Component Value Date   TSH 2.82 10/25/2021   Lab Results  Component Value Date   VITAMINB12 665 10/25/2021   T4 was normal and magnesium was 1.7 which was normal.   Additional recommendations is to stop iron as it may constipate and she is not anemic at this point.  She can try a saline Fleet enema for relief of constipation and see if that helps.  Discontinue mesalamine she is on prednisone and starting Entyvio and it has not been helpful anyway.  I did communicate with Dr. Marcello Moores of surgery and understandably she does not think surgery is indicated nor do I.   Additional considerations would be could she have had  some sort of neuropathic change related to breast cancer chemotherapy though I think that would be unusual.  She seemed to have more problems after she started therapy but she has had problems before that as well.  Though some imaging studies have suggested small bowel Crohn's disease we have not really seen with certainty.  She will be given 20 milliequivalents potassium prescription to supplement and I will recheck that in 2 weeks.  We will have her do an office visit in 2 months.  Sooner if needed.  Copy to Dr. [name] Dr. Garwin Brothers Dr. Lindi Adie Subjective:   Chief Complaint: Crohn's and bowel obstruction  HPI The patient is a 46 year old African-American woman with Crohn's disease status post subtotal colectomy for colonic stricturing disease, who has had multiple admissions for bowel obstructions in the past year.  She had been treated for breast cancer with TCHP regimen (docetraxel, carboplatin, trastuzumab, pertuzumab).  In general she was not on therapy for her Crohn's disease during this time.  She has been given steroids off and on during multiple hospital admissions.  There was some question of whether or not her biologic treatment for her breast cancer was causing some of her bowel issues.  She went back to the emergency department last week because of persistent bloating and distention and she has persistent diffusely dilated and  fecalized small bowel loops, dilated to about 5 cm.  This has been the same on multiple CT scans this year.  She struggles to have adequate defecation.  She will use MiraLAX 3/2 doses a day 2 or 3 days a week.  She does not feel like it is very effective.  She has tried some suppositories, Fleet suppositories.  I do not think she has tried an enema.  She went back on mesalamine as well.  When I saw her last month she was quite concerned about the potential of biologic therapy causing cancer and requested a second opinion and she was seen at [facility] and Dr. Roosevelt Locks  concurred that Weyman Rodney was a good choice of biologic for her.  Despite feeling unwell she continues to work from home for [employer] and she is caring for her 89-monthold grandchild.  Potassium was 3 when she was in the ER last week.  She has been taking some over-the-counter home potassium supplements.  She had a thyroid ultrasound performed due to thyromegaly in February (Dr. CGarwin Brothers.  IMPRESSION: 1. Borderline enlarged and moderately heterogeneous appearing thyroid without worrisome nodule or mass. 2. None of the discretely measured punctate (sub 0.9 cm) spongiform/benign-appearing nodules or cysts meet imaging criteria to recommend percutaneous sampling or continued dedicated follow-up.   Summary of  GI problems/evaluation:   CT 09/2018 prior to subtotal colectomy  IMPRESSION: 1. Extensive mid to distal small bowel and colonic inflammatory changes consistent with known Crohn's disease. There is wall thickening and enhancement throughout much of the mid the distal small bowel as well as throughout much of the colon. The proximal small bowel and stomach appear unremarkable. No frank bowel obstruction evident. Note that there is localized fluid extending from the proximal transverse colon throughout most of the remainder of the transverse colon, potentially due to a degree of ileus in this area. No fistulae are evident. There is fluid adjacent to the cecum which is probably of post inflammatory etiology.   2. The appendix appears unremarkable. No abscess evident in the abdomen or pelvis.   3.  Enlarged leiomyomatous uterus.   4. No evident renal or ureteral calculus. No hydronephrosis. Urinary bladder appears unremarkable.   5.  No evidence sacroiliitis.     Electronically Signed   By: [name] M.D.   On: 09/10/2018 15:11    CT 01/2019 still prior to surgery  IMPRESSION: 1. Stricture with partial obstruction of the transverse colon associated with  marked mural nodularity and mucosal nodularity. While this may be due to robust inflammatory changes, the possibility of colonic neoplasm with this pattern has been seen in the setting of Crohn's disease, assessment with colonoscopy may be warranted. 2. Numerous mildly enlarged upper abdominal lymph nodes may be reactive, attention on follow-up. 3. Endometrial lesion measuring 2.8 cm in greatest width and extending through the endometrial canal from the fundus of the uterus. This is of uncertain significance. Pelvic ultrasound follow-up is suggested. Findings could represent large pedunculated intra cavitary fibroid, polyp or neoplasm. 4. Small volume ascites in the right hemiabdomen is diminished when compared to the prior study as is inflammation of the descending colon. 5. Normal appendix is. 6. These results were called by telephone at the time of interpretation on 04/02/2019 at 1:43 pm to provider DAN FLOYD , who verbally acknowledged these results.   Admisson for rectal bleeding March 2022 - just after starting chemotx  CT IMPRESSION: 1. Postsurgical changes from prior subtotal colectomy with ileocolic anastomosis seen in  the midline abdomen. Mild air distention of the distal most segments of small bowel with some fecalization just proximal to and across the patent anastomotic line. Inspissated fecal contents in the colonic remnant. Findings could reflect a mild ileus or slowed intestinal transit possibly a secondary finding to the additional inflammatory features of the colonic remnant suspicious for a colitis, favor inflammatory in this patient with history of Crohn's. 2. Enlarged, heterogeneous fibroid uterus. Multiple intramural, subserosal and submucosal fibroids throughout the uterus result in distortion of the endometrium though some questionable endometrial thickening may be present up to 25 mm which is greater than expected, similar to comparison CT imaging.  Recommend further evaluation with pelvic ultrasound as clinically indicated. 3. Irregular thickening of the tissues in the right breast lower outer quadrant likely corresponding to the abnormality seen on comparison breast Recommend correlation with recent mammography.     Electronically Signed   By: Lovena Le M.D.   On: 06/30/2020 03:08  Admissions for bowel obstruction October 2022 February 2023 February 2023 second admission that month May 2023  CT scanning with signs of bowel obstruction and 5 cm dilated bowel loops on all of those admissions, and last week as well.  At her first admission in October and intussusception at the ileocolonic anastomosis was suspected.  On the second when twisting and kinking at the transition point likely due to adhesions was suspected.  Similar findings with ileocolonic transition point February and similar findings in May and July.   Endoscopic evaluation  June 2021   (prior to hospitalization for bowel obstructions and status post subtotal colectomy) mild inflammation at the anastomosis otherwise looks normal biopsy same inflammatory changes at anastomosis normal rectum and ileum  March 2022   - The terminal ileum is normal. - The colonic anastomosis is normal. - Diffuse inflammation was found in the distal rectum. Biopsied. - Normal colon otherwise Differential diagnosis includes drug induced colitis (Docetaxil), Crohn's disease, vs infectious. I think drug reaction is most likely. It would be unusual to have a Crohn's flare in the setting of chemotherapy and her surgical anastomosis and ileum, looked normal. moderately active chronic colitis on rectal biopsies the anastomosis and ileum were normal in appearance   October 2022  - Preparation of the colon was fair, time spent to lavage the colon to complete the exam. - Patient ileo-sigmoid surgical anastomosis which appeared healthy. - Ileum deeply intubated - some scattered areas of  small erosions and nonspecific mild erythema but no significant inflammation, no stenosis / stricture / mass lesions appreciated. Ileal biopsies obtained - Erythematous mucosa in the distal rectum. Biopsied.  Changes of chronic colitis - Colon otherwise normal but views limited in the colon due to prep. Unclear cause of obstruction - no focal pathology noted on the exam to cause this. Anastomosis appears patent. Mild scattered inflammatory changes of the ileum of unclear significance.  May 2023  - Anastomotic stenosis with ulcers and edema s/o recurrence of Crohn's disease. Biopsied. - Crohn's proctitis- biopsied.   anastomosis nonspecific architectural inflammatory change consistent with anastomotic site on biopsy, rectum moderately active chronic colitis  Allergies  Allergen Reactions   Nsaids Other (See Comments)    Crohns disease/ IBD   Other     BLOOD PRODUCT REFUSAL   Current Meds  Medication Sig   acetaminophen (TYLENOL) 500 MG tablet Take 500-1,000 mg by mouth every 6 (six) hours as needed for mild pain.   Ferrous Sulfate (IRON PO) Take 1 tablet by mouth daily.  mesalamine (LIALDA) 1.2 g EC tablet Take 2 tablets (2.4 g total) by mouth 2 (two) times daily with a meal.   Multiple Vitamins-Minerals (MULTIVITAMIN WITH MINERALS) tablet Take 1 tablet by mouth daily.   predniSONE (DELTASONE) 10 MG tablet Take 2 tablets (20 mg total) by mouth daily with breakfast. Stay at this dose until you return to Dr. Carlean Purl   predniSONE (DELTASONE) 20 MG tablet Take 2 tabs (40 mg total) daily x7 days, then 1.5 tabs (30 mg total) daily x7 days, then 1 tab (20 Mg total) daily thereafter until follow-up with your gastroenterologist.   Past Medical History:  Diagnosis Date   Cancer [facility])    Colon stricture ([place]) 05/07/2019   Crohn's colitis, with intestinal obstruction ([place]) 09/17/2013   Dx 2006 - right and left colon involved 2011 - pan-colitis 09/17/2013 left colitis 60-20 cm March 2021  subtotal colectomy ileosigmoid anastomosis for colonic strictures-Dr. Marcello Moores     Crohn's disease [facility])    Family history of breast cancer 06/11/2020   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 10/17/2010   Rectal bleeding    Uterine fibroid    Vitamin D deficiency 09/24/2013   Past Surgical History:  Procedure Laterality Date   AXILLARY LYMPH NODE DISSECTION Right 11/09/2020   Procedure: RIGHT AXILLARY LYMPH NODE DISSECTION;  Surgeon: [name], MD;  Location: [place];  Service: General;  Laterality: Right;   BIOPSY  07/01/2020   Procedure: BIOPSY;  Surgeon: Yetta Flock, MD;  Location: [facility];  Service: Gastroenterology;;   BIOPSY  01/27/2021   Procedure: BIOPSY;  Surgeon: Yetta Flock, MD;  [facility]: [facility];  Service: Gastroenterology;;   BIOPSY  09/07/2021   Procedure: BIOPSY;  Surgeon: Jackquline Denmark, MD;  Location: [facility];  Service: Gastroenterology;;   BREAST RECONSTRUCTION WITH PLACEMENT OF TISSUE EXPANDER AND FLEX HD (ACELLULAR HYDRATED DERMIS) Bilateral 11/09/2020   Procedure: BILATERAL BREAST RECONSTRUCTION WITH PLACEMENT OF TISSUE EXPANDER AND FLEX HD (ACELLULAR HYDRATED DERMIS);  Surgeon: [name], MD;  Location: [place];  Service: Plastics;  Laterality: Bilateral;   CAPSULOTOMY Bilateral 07/25/2021   Procedure: CAPSULOTOMY;  Surgeon: [name], MD;  Location: [place];  Service: Plastics;  Laterality: Bilateral;   COLECTOMY  06/27/2019   COLONOSCOPY  2015   FLEXIBLE SIGMOIDOSCOPY N/A 07/01/2020   Procedure: FLEXIBLE SIGMOIDOSCOPY;  Surgeon: Yetta Flock, MD;  [facility];  Service: Gastroenterology;  Laterality: N/A;   FLEXIBLE SIGMOIDOSCOPY N/A 01/27/2021   Procedure: FLEXIBLE SIGMOIDOSCOPY;  Surgeon: Yetta Flock, MD;  [facility];  Service: Gastroenterology;  Laterality: N/A;   FLEXIBLE SIGMOIDOSCOPY N/A  09/07/2021   Procedure: FLEXIBLE SIGMOIDOSCOPY;  Surgeon: Jackquline Denmark, MD;  [facility];  Service: Gastroenterology;  Laterality: N/A;   NIPPLE SPARING MASTECTOMY Bilateral 11/09/2020   Procedure: BILATERAL NIPPLE SPARING MASTECTOMY;  Surgeon: [name], MD;  Location: [place];  Service: General;  Laterality: Bilateral;   PORT-A-CATH REMOVAL N/A 07/25/2021   Procedure: REMOVAL PORT-A-CATH;  Surgeon: [name], MD;  Location: [place];  Service: Plastics;  Laterality: N/A;   PORTACATH PLACEMENT N/A 06/23/2020   Procedure: INSERTION PORT-A-CATH;  Surgeon: [name], MD;  Location: [place];  Service: General;  Laterality: N/A;  START TIME OF 11:00 AM FOR 60 MINUTES [place] [facility]   REMOVAL OF BILATERAL TISSUE EXPANDERS WITH PLACEMENT OF BILATERAL BREAST IMPLANTS Bilateral 07/25/2021   Procedure: REMOVAL OF  BILATERAL TISSUE EXPANDERS WITH PLACEMENT OF BILATERAL BREAST IMPLANTS;  Surgeon: [name], MD;  Location: [place];  Service: Plastics;  Laterality: Bilateral;  1.5   Social History   Social History Narrative   Single, 1 daughter born 2001 approximately   Careers information officer for [employer]   She is a former smoker, occasional alcohol no drug use   family history includes Breast cancer in her cousin and maternal aunt; Diabetes in her father and paternal aunt.   Review of Systems As above  Objective:   Physical Exam BP 120/84   Pulse (!) 103   Ht 5' 4"  (1.626 m)   Wt 127 lb (57.6 kg)   BMI 21.80 kg/m  Middle-aged black woman in no acute distress Neck shows a mildly enlarged thyroid right greater than left lobe to my exam no nodules nontender June McMurray, CMA present  Abdominal exam protuberant distended she really looks about 6 months pregnant perhaps, tympanitic soft bowel sounds present, mildly tender.  No masses or hernia.  Rectal exam tender but no signs of fissure, normal  anoderm and no mass or impaction (rectum was empty).   > 60 mins time

## 2021-10-25 NOTE — Patient Instructions (Addendum)
Your provider has requested that you go to the basement level for lab work before leaving today. Press "B" on the elevator. The lab is located at the first door on the left as you exit the elevator.  Due to recent changes in healthcare laws, you may see the results of your imaging and laboratory studies on MyChart before your provider has had a chance to review them.  We understand that in some cases there may be results that are confusing or concerning to you. Not all laboratory results come back in the same time frame and the provider may be waiting for multiple results in order to interpret others.  Please give [place] 48 hours in order for your provider to thoroughly review all the results before contacting the office for clarification of your results.   Use over the counter Fleet enema as needed for constipation.  Stop the mesalamine and the iron per Dr Carlean Purl.   I appreciate the opportunity to care for you. [name], MD, [facility]

## 2021-10-26 ENCOUNTER — Other Ambulatory Visit: Payer: Self-pay

## 2021-10-26 ENCOUNTER — Other Ambulatory Visit: Payer: Self-pay | Admitting: Internal Medicine

## 2021-10-26 DIAGNOSIS — E876 Hypokalemia: Secondary | ICD-10-CM

## 2021-10-26 MED ORDER — POTASSIUM CHLORIDE CRYS ER 20 MEQ PO TBCR
20.0000 meq | EXTENDED_RELEASE_TABLET | Freq: Every day | ORAL | 0 refills | Status: DC
Start: 2021-10-26 — End: 2021-12-27

## 2021-10-31 ENCOUNTER — Ambulatory Visit: Payer: Managed Care, Other (non HMO)

## 2021-10-31 VITALS — BP 134/94 | HR 98 | Temp 99.1°F | Resp 18 | Ht 64.0 in | Wt 126.4 lb

## 2021-10-31 DIAGNOSIS — K50819 Crohn's disease of both small and large intestine with unspecified complications: Secondary | ICD-10-CM

## 2021-10-31 MED ORDER — VEDOLIZUMAB 300 MG IV SOLR
300.0000 mg | Freq: Once | INTRAVENOUS | Status: AC
  Administered 2021-10-31: 300 mg via INTRAVENOUS
  Filled 2021-10-31: qty 5

## 2021-10-31 NOTE — Progress Notes (Signed)
Diagnosis: Crohn's Disease  Provider:  Marshell Garfinkel, MD  Procedure: Infusion  IV Type: Peripheral, IV Location: L Antecubital  Entyvio (Vedolizumab), Dose: 300 mg  Infusion Start Time: 2111  Infusion Stop Time: 1020  Post Infusion IV Care: Observation period completed and Peripheral IV Discontinued  Discharge: Condition: Good, Destination: Home . AVS provided to patient.   Performed by:  Arnoldo Morale, RN

## 2021-11-14 ENCOUNTER — Encounter: Payer: Self-pay | Admitting: Internal Medicine

## 2021-11-14 ENCOUNTER — Ambulatory Visit: Payer: Managed Care, Other (non HMO)

## 2021-11-14 VITALS — BP 122/84 | HR 114 | Temp 98.7°F | Resp 16 | Ht 64.0 in | Wt 121.2 lb

## 2021-11-14 DIAGNOSIS — K50819 Crohn's disease of both small and large intestine with unspecified complications: Secondary | ICD-10-CM | POA: Diagnosis not present

## 2021-11-14 MED ORDER — VEDOLIZUMAB 300 MG IV SOLR
300.0000 mg | Freq: Once | INTRAVENOUS | Status: AC
  Administered 2021-11-14: 300 mg via INTRAVENOUS
  Filled 2021-11-14: qty 5

## 2021-11-14 NOTE — Progress Notes (Signed)
Diagnosis: Crohn's Disease  Provider:  Marshell Garfinkel, MD  Procedure: Infusion  IV Type: Peripheral, IV Location: L Antecubital  Entyvio (Vedolizumab), Dose: 300 mg  Infusion Start Time: 0925  Infusion Stop Time: 0958   Post Infusion IV Care: Peripheral IV Discontinued  Discharge: Condition: Good, Destination: Home . AVS provided to patient.   Performed by:  Adelina Mings, LPN

## 2021-11-24 ENCOUNTER — Ambulatory Visit (INDEPENDENT_AMBULATORY_CARE_PROVIDER_SITE_OTHER): Payer: Managed Care, Other (non HMO) | Admitting: Surgical

## 2021-11-24 DIAGNOSIS — Z923 Personal history of irradiation: Secondary | ICD-10-CM

## 2021-11-24 DIAGNOSIS — N651 Disproportion of reconstructed breast: Secondary | ICD-10-CM

## 2021-11-24 DIAGNOSIS — C50411 Malignant neoplasm of upper-outer quadrant of right female breast: Secondary | ICD-10-CM

## 2021-11-24 NOTE — Progress Notes (Signed)
   Referring Provider No referring provider defined for this encounter.   CC:  Chief Complaint  Patient presents with   Follow-up      [name] is an 46 y.o. female.  HPI: Patient is a 46 year old female here for follow-up after removal of bilateral breast tissue expanders and placement of bilateral breast implants, bilateral capsulotomy and removal of Port-A-Cath with Dr. Claudia Desanctis on 07/25/2021.  She is 4 months postop.  She reports overall she is doing well.  She reports that she is bothered by some asymmetry between the position of bilateral implants.  She feels as if the right breast implant is sitting higher on her chest wall.  Of note, she does have history of radiation to the right breast.  She is traveling to [facility] for the weekend as it is her birthday in a few days.  Of note she had [employer]or high-profile extra implants with 450 cc of volume placed bilaterally.  Review of Systems General: No fevers or chills  Physical Exam    11/14/2021   10:06 AM 11/14/2021    8:27 AM 10/31/2021   10:52 AM  Vitals with BMI  Height  5' 4"    Weight  121 lbs 3 oz   BMI  56.38   Systolic 756 433 295  Diastolic 84 82 94  Pulse 188 101 98    General:  No acute distress,  Alert and oriented, Non-Toxic, Normal speech and affect Bilateral breast: Bilateral NAC's are viable, bilateral inframammary fold incisions intact.  She does have some asymmetry of the position of bilateral breast implants, the right is sitting higher on her chest wall and has more superior pole volume.  Breast size appears symmetric.  Assessment/Plan Patient is a 45 year old female status post breast reconstruction with implants in place.  She has healed well.  We discussed that due to the radiation to the right breast she does have some asymmetry.  Her capsule was released along the inferior portion to improve symmetry at implant placement, however we discussed that lowering the implant much more would cause a "double  bubble" affect if the inframammary fold was to be transected.  We discussed banding and massaging of the right breast implant to help with repositioning.  We did discuss that with time it may also settle more.  Patient was understanding of this, all of her questions were answered in content to this.  She is aware that she has no restrictions at this time.  Pictures were taken and placed in her chart with her permission.  We will plan to see her back in 8 months for reevaluation.  She knows to call with questions or concerns.   [name] [name] 11/24/2021, 8:36 AM

## 2021-12-13 ENCOUNTER — Ambulatory Visit: Payer: Managed Care, Other (non HMO) | Admitting: *Deleted

## 2021-12-13 VITALS — BP 121/81 | HR 107 | Temp 98.7°F | Resp 18 | Ht 64.0 in | Wt 119.6 lb

## 2021-12-13 DIAGNOSIS — K50819 Crohn's disease of both small and large intestine with unspecified complications: Secondary | ICD-10-CM | POA: Diagnosis not present

## 2021-12-13 MED ORDER — VEDOLIZUMAB 300 MG IV SOLR
300.0000 mg | Freq: Once | INTRAVENOUS | Status: AC
  Administered 2021-12-13: 300 mg via INTRAVENOUS
  Filled 2021-12-13: qty 5

## 2021-12-13 NOTE — Progress Notes (Signed)
Diagnosis: Crohn's Disease  Provider:  Marshell Garfinkel MD  Procedure: Infusion  IV Type: Peripheral, IV Location: L Antecubital  Entyvio (Vedolizumab), Dose: 300 mg  Infusion Start Time: 0638 am  Infusion Stop Time: 6854 am  Post Infusion IV Care: Observation period completed and Peripheral IV Discontinued  Discharge: Condition: Good, Destination: Home . AVS provided to patient.   Performed by:  Oren Beckmann, RN

## 2021-12-14 ENCOUNTER — Ambulatory Visit: Payer: Managed Care, Other (non HMO) | Admitting: Internal Medicine

## 2021-12-26 ENCOUNTER — Inpatient Hospital Stay: Payer: Managed Care, Other (non HMO) | Attending: Hematology and Oncology

## 2021-12-26 ENCOUNTER — Encounter (HOSPITAL_COMMUNITY): Payer: Self-pay

## 2021-12-26 ENCOUNTER — Ambulatory Visit (HOSPITAL_COMMUNITY)
Admission: RE | Admit: 2021-12-26 | Discharge: 2021-12-26 | Disposition: A | Discharge status: Home / Self Care | Payer: Managed Care, Other (non HMO) | Source: Ambulatory Visit | Attending: Hematology and Oncology | Admitting: Hematology and Oncology

## 2021-12-26 DIAGNOSIS — Z170 Estrogen receptor positive status [ER+]: Secondary | ICD-10-CM | POA: Insufficient documentation

## 2021-12-26 DIAGNOSIS — C50411 Malignant neoplasm of upper-outer quadrant of right female breast: Secondary | ICD-10-CM | POA: Insufficient documentation

## 2021-12-26 LAB — CBC WITH DIFFERENTIAL (CANCER CENTER ONLY)
Abs Immature Granulocytes: 0.03 10*3/uL (ref 0.00–0.07)
Basophils Absolute: 0 10*3/uL (ref 0.0–0.1)
Basophils Relative: 0 %
Eosinophils Absolute: 0 10*3/uL (ref 0.0–0.5)
Eosinophils Relative: 0 %
HCT: 33.4 % — ABNORMAL LOW (ref 36.0–46.0)
Hemoglobin: 10.4 g/dL — ABNORMAL LOW (ref 12.0–15.0)
Immature Granulocytes: 0 %
Lymphocytes Relative: 25 %
Lymphs Abs: 2.2 10*3/uL (ref 0.7–4.0)
MCH: 27.1 pg (ref 26.0–34.0)
MCHC: 31.1 g/dL (ref 30.0–36.0)
MCV: 87 fL (ref 80.0–100.0)
Monocytes Absolute: 0.8 10*3/uL (ref 0.1–1.0)
Monocytes Relative: 8 %
Neutro Abs: 6 10*3/uL (ref 1.7–7.7)
Neutrophils Relative %: 67 %
Platelet Count: 279 10*3/uL (ref 150–400)
RBC: 3.84 MIL/uL — ABNORMAL LOW (ref 3.87–5.11)
RDW: 16.7 % — ABNORMAL HIGH (ref 11.5–15.5)
WBC Count: 9.1 10*3/uL (ref 4.0–10.5)
nRBC: 0 % (ref 0.0–0.2)

## 2021-12-26 LAB — CMP (CANCER CENTER ONLY)
ALT: 15 U/L (ref 0–44)
AST: 16 U/L (ref 15–41)
Albumin: 3.3 g/dL — ABNORMAL LOW (ref 3.5–5.0)
Alkaline Phosphatase: 74 U/L (ref 38–126)
Anion gap: 9 (ref 5–15)
BUN: 15 mg/dL (ref 6–20)
CO2: 28 mmol/L (ref 22–32)
Calcium: 9.1 mg/dL (ref 8.9–10.3)
Chloride: 106 mmol/L (ref 98–111)
Creatinine: 0.92 mg/dL (ref 0.44–1.00)
GFR, Estimated: >60 mL/min (ref >60)
Glucose, Bld: 81 mg/dL (ref 70–99)
Potassium: 3.6 mmol/L (ref 3.5–5.1)
Sodium: 143 mmol/L (ref 135–145)
Total Bilirubin: 0.8 mg/dL (ref 0.3–1.2)
Total Protein: 6.7 g/dL (ref 6.5–8.1)

## 2021-12-26 MED ORDER — SODIUM CHLORIDE (PF) 0.9 % IJ SOLN
INTRAMUSCULAR | Status: AC
  Filled 2021-12-26: qty 50

## 2021-12-26 MED ORDER — IOHEXOL 300 MG/ML  SOLN
100.0000 mL | Freq: Once | INTRAMUSCULAR | Status: AC | PRN
  Administered 2021-12-26: 100 mL via INTRAVENOUS

## 2021-12-27 ENCOUNTER — Ambulatory Visit (INDEPENDENT_AMBULATORY_CARE_PROVIDER_SITE_OTHER): Payer: Managed Care, Other (non HMO) | Admitting: Internal Medicine

## 2021-12-27 ENCOUNTER — Other Ambulatory Visit: Payer: Self-pay | Admitting: Internal Medicine

## 2021-12-27 ENCOUNTER — Encounter: Payer: Self-pay | Admitting: Internal Medicine

## 2021-12-27 ENCOUNTER — Other Ambulatory Visit (INDEPENDENT_AMBULATORY_CARE_PROVIDER_SITE_OTHER): Payer: Managed Care, Other (non HMO)

## 2021-12-27 VITALS — BP 100/82 | HR 101 | Ht 64.0 in | Wt 121.2 lb

## 2021-12-27 DIAGNOSIS — Z7960 Long term (current) use of unspecified immunomodulators and immunosuppressants: Secondary | ICD-10-CM | POA: Diagnosis not present

## 2021-12-27 DIAGNOSIS — K50112 Crohn's disease of large intestine with intestinal obstruction: Secondary | ICD-10-CM

## 2021-12-27 DIAGNOSIS — D649 Anemia, unspecified: Secondary | ICD-10-CM | POA: Diagnosis not present

## 2021-12-27 DIAGNOSIS — E876 Hypokalemia: Secondary | ICD-10-CM

## 2021-12-27 LAB — IBC + FERRITIN
Ferritin: 50.7 ng/mL (ref 10.0–291.0)
Iron: 65 ug/dL (ref 42–145)
Saturation Ratios: 26.4 % (ref 20.0–50.0)
TIBC: 246.4 ug/dL — ABNORMAL LOW (ref 250.0–450.0)
Transferrin: 176 mg/dL — ABNORMAL LOW (ref 212.0–360.0)

## 2021-12-27 LAB — BASIC METABOLIC PANEL
BUN: 13 mg/dL (ref 6–23)
CO2: 30 mEq/L (ref 19–32)
Calcium: 9.1 mg/dL (ref 8.4–10.5)
Chloride: 101 mEq/L (ref 96–112)
Creatinine, Ser: 0.94 mg/dL (ref 0.40–1.20)
GFR: 74.02 mL/min (ref >60.00)
Glucose, Bld: 81 mg/dL (ref 70–99)
Potassium: 3 mEq/L — ABNORMAL LOW (ref 3.5–5.1)
Sodium: 139 mEq/L (ref 135–145)

## 2021-12-27 MED ORDER — POTASSIUM CHLORIDE CRYS ER 20 MEQ PO TBCR: 20.0000 meq | EXTENDED_RELEASE_TABLET | Freq: Every day | ORAL | 2 refills | Status: DC

## 2021-12-27 NOTE — Progress Notes (Signed)
CALEIGHA ZALE 46 y.o. 01-Jan-1976 [id number]  Assessment & Plan:   Encounter Diagnoses  Name Primary?   Crohn's colitis ? small bowel, with intestinal obstruction (Progress) Yes   Long-term use of immunosuppressant medication    Normocytic anemia    She is significantly better, thankfully.  Responding to [facility] and prednisone treatment.  She MyChart message to me after this and asked about the prednisone, we did not discuss that but she can taper off 5 mg daily for 2 weeks and stop.  Continue Entyvio and return here in 6 months sooner if needed.  She may try to liberalize her diet.  Iron studies were checked today as well.  She will continue follow-up with heme-onc also.  Because of mild decrease in hemoglobin unclear.  Iron studies do not show a cause and B12 was normal earlier this year.  Lab Results  Component Value Date   IRON 65 12/27/2021   TIBC 246.4 (L) 12/27/2021   FERRITIN 50.7 12/27/2021    The patient asked me when we would stop Entyvio and I once again and explained to her that Crohn's disease is a chronic autoimmune disease that requires chronic therapy and there is no cure at this point in time.  Copy to Dr. [name] and Dr. Servando Salina    Subjective:   Chief Complaint: Follow-up of Crohn's disease  HPI 46 year old African-American woman with a history of Crohn's disease status post subtotal colectomy for colonic stricturing disease, multiple bowel obstruction admissions 2020 05-2021, history of breast cancer treated with TCHP regimen who presents for follow-up after initiating Entyvio.  She was last seen in September 2023 and Entyvio 300 mg initiated and she has had 3 doses and is improved.  She was quite concerned about using Biologics because of her cancer history.  She remains on prednisone 10 mg daily.  She reports no more significant constipation stools are relatively normal.  No bleeding issues.  Still on a low fiber diet.  Beef will constipate her  so she is using seafood and beans for protein and has experimented with some protein supplements but is concerned about potential issue so really not using those.  Would like to gain weight.  Clothing is loose.    Lab Results  Component Value Date   WBC 9.1 12/26/2021   HGB 10.4 (L) 12/26/2021   HCT 33.4 (L) 12/26/2021   MCV 87.0 12/26/2021   PLT 279 12/26/2021       Summary of  GI problems/evaluation:     CT 09/2018 prior to subtotal colectomy   IMPRESSION: 1. Extensive mid to distal small bowel and colonic inflammatory changes consistent with known Crohn's disease. There is wall thickening and enhancement throughout much of the mid the distal small bowel as well as throughout much of the colon. The proximal small bowel and stomach appear unremarkable. No frank bowel obstruction evident. Note that there is localized fluid extending from the proximal transverse colon throughout most of the remainder of the transverse colon, potentially due to a degree of ileus in this area. No fistulae are evident. There is fluid adjacent to the cecum which is probably of post inflammatory etiology.   2. The appendix appears unremarkable. No abscess evident in the abdomen or pelvis.   3.  Enlarged leiomyomatous uterus.   4. No evident renal or ureteral calculus. No hydronephrosis. Urinary bladder appears unremarkable.   5.  No evidence sacroiliitis.     Electronically Signed   By: Lowella Grip  III M.D.   On: 09/10/2018 15:11       CT 01/2019 still prior to surgery   IMPRESSION: 1. 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 While this may be due to robust inflammatory changes, the possibility of colonic neoplasm with this pattern has been seen in the setting of Crohn's disease, assessment with colonoscopy may be warranted. 2. Numerous mildly enlarged upper abdominal lymph nodes may be reactive,  attention on follow-up. 3. Endometrial lesion measuring 2.8 cm in greatest width and extending through the endometrial canal from the fundus of the uterus. This is of uncertain significance. Pelvic ultrasound follow-up is suggested. Findings could represent large pedunculated intra cavitary fibroid, polyp or neoplasm. 4. Small volume ascites in the right hemiabdomen is diminished when compared to the prior study as is inflammation of the descending colon. 5. Normal appendix is. 6. These results were called by telephone at the time of interpretation on 04/02/2019 at 1:43 pm to provider DAN FLOYD , who verbally acknowledged these results.     Admisson for rectal bleeding March 2022 - just after starting chemotx   CT IMPRESSION: 1. Postsurgical changes from prior subtotal colectomy with ileocolic anastomosis seen in the midline abdomen. Mild air distention of the distal most segments of small bowel with some fecalization just proximal to and across the patent anastomotic line. Inspissated fecal contents in the colonic remnant. Findings could reflect a mild ileus or slowed intestinal transit possibly a secondary finding to the additional inflammatory features of the colonic remnant suspicious for a colitis, favor inflammatory in this patient with history of Crohn's. 2. Enlarged, heterogeneous fibroid uterus. Multiple intramural, subserosal and submucosal fibroids throughout the uterus result in distortion of the endometrium though some questionable endometrial thickening may be present up to 25 mm which is greater than expected, similar to comparison CT imaging. Recommend further evaluation with pelvic ultrasound as clinically indicated. 3. Irregular thickening of the tissues in the right breast lower outer quadrant likely corresponding to the abnormality seen on comparison breast Recommend correlation with recent mammography.     Electronically Signed   By: Lovena Le M.D.    On: 06/30/2020 03:08   Admissions for bowel obstruction October 2022 February 2023 February 2023 second admission that month May 2023   CT scanning with signs of bowel obstruction and 5 cm dilated bowel loops on all of those admissions, and last week as well.  At her first admission in October and intussusception at the ileocolonic anastomosis was suspected.  On the second when twisting and kinking at the transition point likely due to adhesions was suspected.  Similar findings with ileocolonic transition point February and similar findings in May and July.     Endoscopic evaluation   June 2021    (prior to hospitalization for bowel obstructions and status post subtotal colectomy) mild inflammation at the anastomosis otherwise looks normal biopsy same inflammatory changes at anastomosis normal rectum and ileum   March 2022    - The terminal ileum is normal. - The colonic anastomosis is normal. - Diffuse inflammation was found in the distal rectum. Biopsied. - Normal colon otherwise Differential diagnosis includes drug induced colitis (Docetaxil), Crohn's disease, vs infectious. I think drug reaction is most likely. It would be unusual to have a Crohn's flare in the setting of chemotherapy and her surgical anastomosis and ileum, looked normal. moderately active chronic colitis on rectal biopsies the anastomosis and ileum were normal in appearance  October 2022   - Preparation of the colon was fair, time spent to lavage the colon to complete the exam. - Patient ileo-sigmoid surgical anastomosis which appeared healthy. - Ileum deeply intubated - some scattered areas of small erosions and nonspecific mild erythema but no significant inflammation, no stenosis / stricture / mass lesions appreciated. Ileal biopsies obtained - Erythematous mucosa in the distal rectum. Biopsied.  Changes of chronic colitis - Colon otherwise normal but views limited in the colon due to prep. Unclear  cause of obstruction - no focal pathology noted on the exam to cause this. Anastomosis appears patent. Mild scattered inflammatory changes of the ileum of unclear significance.   May 2023  - Anastomotic stenosis with ulcers and edema s/o recurrence of Crohn's disease. Biopsied. - Crohn's proctitis- biopsied.     anastomosis nonspecific architectural inflammatory change consistent with anastomotic site on biopsy, rectum moderately active chronic colitis  September 2023-Entyvio therapy initiated Allergies  Allergen Reactions   Nsaids Other (See Comments)    Crohns disease/ IBD   Other     BLOOD PRODUCT REFUSAL   Current Meds  Medication Sig   acetaminophen (TYLENOL) 500 MG tablet Take 500-1,000 mg by mouth every 6 (six) hours as needed for mild pain.   Multiple Vitamins-Minerals (MULTIVITAMIN WITH MINERALS) tablet Take 1 tablet by mouth daily.   predniSONE (DELTASONE) 10 MG tablet Take 2 tablets (20 mg total) by mouth daily with breakfast. Stay at this dose until you return to Dr. Carlean Purl   Past Medical History:  Diagnosis Date   Cancer [facility])    Colon stricture ([place]) 05/07/2019   Crohn's colitis, with intestinal obstruction ([place]) 09/17/2013   Dx 2006 - right and left colon involved 2011 - pan-colitis 09/17/2013 left colitis 60-20 cm March 2021 subtotal colectomy ileosigmoid anastomosis for colonic strictures-Dr. Marcello Moores     Crohn's disease [facility])    Family history of breast cancer 06/11/2020   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 10/17/2010   Rectal bleeding    Uterine fibroid    Vitamin D deficiency 09/24/2013   Past Surgical History:  Procedure Laterality Date   AXILLARY LYMPH NODE DISSECTION Right 11/09/2020   Procedure: RIGHT AXILLARY LYMPH NODE DISSECTION;  Surgeon: [name], MD;  Location: [place];  Service: General;  Laterality: Right;   BIOPSY  07/01/2020   Procedure: BIOPSY;  Surgeon: Yetta Flock,  MD;  Location: [facility];  Service: Gastroenterology;;   BIOPSY  01/27/2021   Procedure: BIOPSY;  Surgeon: Yetta Flock, MD;  [facility]: [facility];  Service: Gastroenterology;;   BIOPSY  09/07/2021   Procedure: BIOPSY;  Surgeon: Jackquline Denmark, MD;  [facility]: [facility];  Service: Gastroenterology;;   BREAST RECONSTRUCTION WITH PLACEMENT OF TISSUE EXPANDER AND FLEX HD (ACELLULAR HYDRATED DERMIS) Bilateral 11/09/2020   Procedure: BILATERAL BREAST RECONSTRUCTION WITH PLACEMENT OF TISSUE EXPANDER AND FLEX HD (ACELLULAR HYDRATED DERMIS);  Surgeon: [name], MD;  Location: [place];  Service: Plastics;  Laterality: Bilateral;   CAPSULOTOMY Bilateral 07/25/2021   Procedure: CAPSULOTOMY;  Surgeon: [name], MD;  Location: [place];  Service: Plastics;  Laterality: Bilateral;   COLECTOMY  06/27/2019   COLONOSCOPY  2015   FLEXIBLE SIGMOIDOSCOPY N/A 07/01/2020   Procedure: FLEXIBLE SIGMOIDOSCOPY;  Surgeon: Yetta Flock, MD;  [facility];  Service: Gastroenterology;  Laterality: N/A;   FLEXIBLE SIGMOIDOSCOPY N/A 01/27/2021   Procedure: FLEXIBLE SIGMOIDOSCOPY;  Surgeon: Yetta Flock, MD;  [facility];  Service: Gastroenterology;  Laterality: N/A;   FLEXIBLE SIGMOIDOSCOPY N/A 09/07/2021   Procedure: FLEXIBLE SIGMOIDOSCOPY;  Surgeon: Jackquline Denmark, MD;  [facility];  Service: Gastroenterology;  Laterality: N/A;   NIPPLE SPARING MASTECTOMY Bilateral 11/09/2020   Procedure: BILATERAL NIPPLE SPARING MASTECTOMY;  Surgeon: [name], MD;  Location: [place];  Service: General;  Laterality: Bilateral;   PORT-A-CATH REMOVAL N/A 07/25/2021   Procedure: REMOVAL PORT-A-CATH;  Surgeon: [name], MD;  Location: [place];  Service: Plastics;  Laterality: N/A;   PORTACATH PLACEMENT N/A 06/23/2020   Procedure: INSERTION PORT-A-CATH;  Surgeon: [name], MD;   Location: [place];  Service: General;  Laterality: N/A;  START TIME OF 11:00 AM FOR 60 MINUTES [place] [facility]   REMOVAL OF BILATERAL TISSUE EXPANDERS WITH PLACEMENT OF BILATERAL BREAST IMPLANTS Bilateral 07/25/2021   Procedure: REMOVAL OF BILATERAL TISSUE EXPANDERS WITH PLACEMENT OF BILATERAL BREAST IMPLANTS;  Surgeon: [name], MD;  Location: [place];  Service: Plastics;  Laterality: Bilateral;  1.5   Social History   Social History Narrative   Single, 1 daughter born 2001 approximately   Careers information officer for [employer]   She is a former smoker, occasional alcohol no drug use   family history includes Breast cancer in her cousin and maternal aunt; Diabetes in her father and paternal aunt.   Review of Systems As above  Objective:   Physical Exam BP 100/82   Pulse (!) 101   Ht 5' 4"  (1.626 m)   Wt 121 lb 4 oz (55 kg)   BMI 20.81 kg/m  NAD Lungs cta Cor NL S1S2 no rmg Abd soft, less protuberant, NT, BS +, slight tympany but less than in past

## 2021-12-27 NOTE — Patient Instructions (Signed)
Your provider has requested that you go to the basement level for lab work before leaving today. Press "B" on the elevator. The lab is located at the first door on the left as you exit the elevator.  The [place] [facility] providers would like to encourage you to use [facility] to communicate with providers for non-urgent requests or questions.  Due to long hold times on the telephone, sending your provider a message by [facility] may be a faster and more efficient way to get a response.  Please allow 48 business hours for a response.  Please remember that this is for non-urgent requests.   Due to recent changes in healthcare laws, you may see the results of your imaging and laboratory studies on MyChart before your provider has had a chance to review them.  We understand that in some cases there may be results that are confusing or concerning to you. Not all laboratory results come back in the same time frame and the provider may be waiting for multiple results in order to interpret others.  Please give [place] 48 hours in order for your provider to thoroughly review all the results before contacting the office for clarification of your results.    Follow up with Dr. Carlean Purl in 6 months.

## 2021-12-30 NOTE — Progress Notes (Signed)
Patient Care Team: Patient, [name] as PCP - General (General Practice) [name], RN as Oncology Nurse Navigator [name], RN as Oncology Nurse Navigator [name], MD as Consulting Physician (Colon and Rectal Surgery) [name], MD as Consulting Physician (Gastroenterology) [name], MD as Consulting Physician (General Surgery) [name], MD as Consulting Physician (Hematology and Oncology)  DIAGNOSIS: No diagnosis found.  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 palpable right breast mass measuring about 7 cm x 5 and half centimeters, palpable right axillary lymph node with concern for skin invasion    06/08/2020 Cancer Staging   Staging form: Breast, AJCC 8th Edition - Clinical: Stage IIIB (cT4d, cN1, cM0, G3, ER+, PR-, HER2+) - Signed by [name], MD on 07/07/2020 Histologic grading system: 3 grade system   06/24/2020 Genetic Testing   No pathogenic variants detected in [facility] [employer] +RNAinsight.  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  The report date is June 24, 2020.   The CustomNext-Cancer+RNAinsight panel offered by [facility] includes sequencing and rearrangement analysis for the following 47 genes:  APC, ATM, AXIN2, BARD1, BMPR1A, BRCA1, BRCA2, BRIP1, CDH1, CDK4, CDKN2A, CHEK2, DICER1, EPCAM, GREM1, HOXB13, MEN1, MLH1, MSH2, MSH3, MSH6, MUTYH, NBN, NF1, NF2, NTHL1, PALB2, PMS2, POLD1, POLE, PTEN, RAD51C, RAD51D, RECQL, RET, SDHA, SDHAF2, SDHB, SDHC, SDHD, SMAD4, SMARCA4, STK11, TP53, TSC1,  TSC2, and VHL.  RNA data is routinely analyzed for use in variant interpretation for all genes.   06/25/2020 - 10/29/2020 Chemotherapy   TCHP x6 cycles   11/09/2020 Surgery   Bilateral mastectomies: Left mastectomy: Benign Right mastectomy: Residual microinvasive cancer status post neoadjuvant therapy, DCIS, 0/12 lymph nodes, ER +1%, PR negative, HER2 equivocal 2+ by [facility]   11/19/2020 - 07/15/2021 Chemotherapy   Patient is on Treatment Plan : BREAST Trastuzumab  + Pertuzumab q21d x 13 cycles       CHIEF COMPLIANT: Follow-up of HER-2 positive right breast cancer    INTERVAL HISTORY: JERLYN PAIN is a 46 y.o. with above-mentioned history of HER-2 positive right breast cancer currently on Herceptin Perjeta maintenance. She presents to the clinic today for follow-up.    ALLERGIES:  is allergic to nsaids and other.  MEDICATIONS:  Current Outpatient Medications  Medication Sig Dispense Refill   acetaminophen (TYLENOL) 500 MG tablet Take 500-1,000 mg by mouth every 6 (six) hours as needed for mild pain.     Multiple Vitamins-Minerals (MULTIVITAMIN WITH MINERALS) tablet Take 1 tablet by mouth daily.     potassium chloride SA (KLOR-CON M) 20 MEQ tablet Take 1 tablet (20 mEq total) by mouth daily. 90 tablet 2   predniSONE (DELTASONE) 10 MG tablet Take 2 tablets (20 mg total) by mouth daily with breakfast. Stay at this dose until you return to Dr. Carlean Purl 100 tablet 0   vedolizumab (ENTYVIO) 300 MG injection Inject into the vein.     No current facility-administered medications for this visit.    PHYSICAL EXAMINATION: ECOG PERFORMANCE STATUS: {CHL ONC ECOG PS:[id number]}  There were no vitals filed for this visit. There  were no vitals filed for this visit.  BREAST:*** No palpable masses or nodules in either right or left breasts. No palpable axillary supraclavicular or infraclavicular adenopathy no breast tenderness or nipple discharge. (exam performed in the presence of a  chaperone)  LABORATORY DATA:  I have reviewed the data as listed    Latest Ref Rng & Units 12/27/2021   10:29 AM 12/26/2021    8:07 AM 10/25/2021    3:41 PM  CMP  Glucose 70 - 99 mg/dL 81  81  92   BUN 6 - 23 mg/dL 13  15  11    Creatinine 0.40 - 1.20 mg/dL 0.94  0.92  1.05   Sodium 135 - 145 mEq/L 139  143  139   Potassium 3.5 - 5.1 mEq/L 3.0  3.6  3.1   Chloride 96 - 112 mEq/L 101  106  99   CO2 19 - 32 mEq/L 30  28  29    Calcium 8.4 - 10.5 mg/dL 9.1  9.1  9.4   Total Protein 6.5 - 8.1 g/dL  6.7  7.4   Total Bilirubin 0.3 - 1.2 mg/dL  0.8  0.6   Alkaline Phos 38 - 126 U/L  74  96   AST 15 - 41 U/L  16  16   ALT 0 - 44 U/L  15  19     Lab Results  Component Value Date   WBC 9.1 12/26/2021   HGB 10.4 (L) 12/26/2021   HCT 33.4 (L) 12/26/2021   MCV 87.0 12/26/2021   PLT 279 12/26/2021   NEUTROABS 6.0 12/26/2021    ASSESSMENT & PLAN:  No problem-specific Assessment & Plan notes found for this encounter.    No orders of the defined types were placed in this encounter.  The patient has a good understanding of the overall plan. she agrees with it. she will call with any problems that may develop before the next visit here. Total time spent: 30 mins including face to face time and time spent for planning, charting and co-ordination of care   Suzzette Righter, [place] 12/30/21    I Gardiner Coins am scribing for Dr. Lindi Adie  ***

## 2022-01-02 ENCOUNTER — Other Ambulatory Visit: Payer: Self-pay

## 2022-01-02 ENCOUNTER — Inpatient Hospital Stay (HOSPITAL_BASED_OUTPATIENT_CLINIC_OR_DEPARTMENT_OTHER): Payer: Managed Care, Other (non HMO) | Admitting: Hematology and Oncology

## 2022-01-02 DIAGNOSIS — Z170 Estrogen receptor positive status [ER+]: Secondary | ICD-10-CM | POA: Diagnosis not present

## 2022-01-02 DIAGNOSIS — C50411 Malignant neoplasm of upper-outer quadrant of right female breast: Secondary | ICD-10-CM

## 2022-01-02 NOTE — Assessment & Plan Note (Addendum)
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 palpable right breast mass measuring about 7 cm x 5 and half centimeters, palpable right axillary lymph node with concern for skin invasion  Treatment Plan: 1. Neoadjuvant chemo with TCHP q 3 weeks X 6 followed by [facility] completed 07/07/2021 2. Bilateral mastectomies: Left mastectomy: Benign Right mastectomy: Residual microinvasive cancer status post neoadjuvant therapy, DCIS, 0/12 lymph nodes, ER +1%, PR negative, HER2 equivocal 2+ by IHC 3. Adj XRTcompleted 02/23/2021 4. Adj Anti-estrogen therapywith tamoxifenstarted 06/08/2021 discontinued 06/24/2021 (ER was only 1% therefore we felt risks outweigh benefits) 5. Neratinib --------------------------------------------------------------------------------------------------------- Hospitalization 05/18/2021-06/13/2021 (small bowel obstruction) We discussed the pros and cons of neratinib.  Because of her history of Crohn's disease and her multiple bowel issues we decided that it was not in her best interest to take neratinib.  Breast cancer surveillance: Since she had bilateral mastectomies there is no role of mammograms. Breast examination 01/02/2022: Benign  Return to clinic in 1 year for follow-up

## 2022-01-03 ENCOUNTER — Telehealth: Payer: Self-pay | Admitting: Hematology and Oncology

## 2022-01-03 NOTE — Telephone Encounter (Signed)
Left patient a voicemail regarding 9/26 appointment

## 2022-01-16 ENCOUNTER — Ambulatory Visit: Payer: Managed Care, Other (non HMO) | Attending: General Surgery

## 2022-01-16 VITALS — Wt 129.1 lb

## 2022-01-16 DIAGNOSIS — Z483 Aftercare following surgery for neoplasm: Secondary | ICD-10-CM | POA: Insufficient documentation

## 2022-01-16 NOTE — Therapy (Signed)
OUTPATIENT PHYSICAL THERAPY SOZO SCREENING NOTE   Patient Name: [name] MRN: [id number] DOB:08-14-1975, 46 y.o., female Today's Date: 01/16/2022  PCP: Patient, [name] REFERRING PROVIDER: [name], MD   PT End of Session - 01/16/22 1606     Visit Number 2   # unchanged due to screen only   PT Start Time 9024    PT Stop Time 0973    PT Time Calculation (min) 4 min    Activity Tolerance Patient tolerated treatment well    Behavior During Therapy [facility] for tasks assessed/performed             Past Medical History:  Diagnosis Date   Cancer ([place])    Colon stricture ([place]) 05/07/2019   Crohn's colitis, with intestinal obstruction ([place]) 09/17/2013   Dx 2006 - right and left colon involved 2011 - pan-colitis 09/17/2013 left colitis 60-20 cm March 2021 subtotal colectomy ileosigmoid anastomosis for colonic strictures-Dr. Marcello Moores     Crohn's disease [facility])    Family history of breast cancer 06/11/2020   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 10/17/2010   Rectal bleeding    Uterine fibroid    Vitamin D deficiency 09/24/2013   Past Surgical History:  Procedure Laterality Date   AXILLARY LYMPH NODE DISSECTION Right 11/09/2020   Procedure: RIGHT AXILLARY LYMPH NODE DISSECTION;  Surgeon: [name], MD;  Location: [facility];  Service: General;  Laterality: Right;   BIOPSY  07/01/2020   Procedure: BIOPSY;  Surgeon: Yetta Flock, MD;  Location: [facility];  Service: Gastroenterology;;   BIOPSY  01/27/2021   Procedure: BIOPSY;  Surgeon: Yetta Flock, MD;  Location: [facility];  Service: Gastroenterology;;   BIOPSY  09/07/2021   Procedure: BIOPSY;  Surgeon: Jackquline Denmark, MD;  Location: [facility];  Service: Gastroenterology;;   BREAST RECONSTRUCTION WITH PLACEMENT OF TISSUE EXPANDER AND FLEX HD (ACELLULAR HYDRATED DERMIS) Bilateral 11/09/2020   Procedure: BILATERAL BREAST RECONSTRUCTION WITH  PLACEMENT OF TISSUE EXPANDER AND FLEX HD (ACELLULAR HYDRATED DERMIS);  Surgeon: [name], MD;  Location: [place];  Service: Plastics;  Laterality: Bilateral;   CAPSULOTOMY Bilateral 07/25/2021   Procedure: CAPSULOTOMY;  Surgeon: [name], MD;  Location: [place];  Service: Plastics;  Laterality: Bilateral;   COLECTOMY  06/27/2019   COLONOSCOPY  2015   FLEXIBLE SIGMOIDOSCOPY N/A 07/01/2020   Procedure: FLEXIBLE SIGMOIDOSCOPY;  Surgeon: Yetta Flock, MD;  [facility];  Service: Gastroenterology;  Laterality: N/A;   FLEXIBLE SIGMOIDOSCOPY N/A 01/27/2021   Procedure: FLEXIBLE SIGMOIDOSCOPY;  Surgeon: Yetta Flock, MD;  [facility];  Service: Gastroenterology;  Laterality: N/A;   FLEXIBLE SIGMOIDOSCOPY N/A 09/07/2021   Procedure: FLEXIBLE SIGMOIDOSCOPY;  Surgeon: Jackquline Denmark, MD;  [facility];  Service: Gastroenterology;  Laterality: N/A;   NIPPLE SPARING MASTECTOMY Bilateral 11/09/2020   Procedure: BILATERAL NIPPLE SPARING MASTECTOMY;  Surgeon: [name], MD;  Location: [place];  Service: General;  Laterality: Bilateral;   PORT-A-CATH REMOVAL N/A 07/25/2021   Procedure: REMOVAL PORT-A-CATH;  Surgeon: [name], MD;  Location: [place];  Service: Plastics;  Laterality: N/A;   PORTACATH PLACEMENT N/A 06/23/2020   Procedure: INSERTION PORT-A-CATH;  Surgeon: [name], MD;  Location: [place];  Service: General;  Laterality: N/A;  START TIME OF 11:00 AM FOR 60 MINUTES [place] [facility]   REMOVAL OF BILATERAL TISSUE EXPANDERS WITH PLACEMENT OF BILATERAL BREAST IMPLANTS Bilateral  07/25/2021   Procedure: REMOVAL OF BILATERAL TISSUE EXPANDERS WITH PLACEMENT OF BILATERAL BREAST IMPLANTS;  Surgeon: [name], MD;  Location: [place];  Service: Plastics;  Laterality: Bilateral;  1.5   Patient Active Problem List   Diagnosis  Date Noted   Leucocytosis 05/27/2021   Crohn's disease of small and large intestines with complication ([place])    SBO (small bowel obstruction) ([place]) 01/26/2021   History of Crohn's disease    Breast cancer ([place]) 11/09/2020   Port-A-Cath in place 07/15/2020   Genetic testing 06/25/2020   Family history of breast cancer 06/11/2020   Malignant neoplasm of upper-outer quadrant of right breast in female, estrogen receptor positive ([place]) 06/04/2020   Abnormal finding present on diagnostic imaging of uterus 05/07/2019   Vitamin D deficiency 09/24/2013   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 10/17/2010    REFERRING DIAG: right breast cancer at risk for lymphedema  THERAPY DIAG:  Aftercare following surgery for neoplasm  PERTINENT HISTORY: Patient was diagnosed on 06/04/2020 with right grade III invasive ductal carcinoma breast cancer. She neoadjuvant chemotherapy from 06/25/2020 - 10/29/2020 followed by bilateral mastectomies with expanders placed on 11/09/2020 with 0/12 axillary nodes positive for cancer. It is ER positive, PR negative, and HER2 negative with a Ki67 of 40%.  PRECAUTIONS: right UE Lymphedema risk, None  SUBJECTIVE: Pt returns for her 1 month SOZO screen after testing outside of baseline. "I've worn my compression sleeve every day. I had noticed some fullness in my axilla initially but this improved over the past month.   PAIN:  Are you having pain? No  SOZO SCREENING: Patient was assessed today using the SOZO machine to determine the lymphedema index score. This was compared to her baseline score. It was determined that she is within the recommended range when compared to her baseline and no further action is needed at this time. She will continue SOZO screenings. These are done every 3 months for 2 years post operatively followed by every 6 months for 2 years, and then annually.   L-DEX FLOWSHEETS - 01/16/22 1600       L-DEX LYMPHEDEMA  SCREENING   Measurement Type Unilateral    L-DEX MEASUREMENT EXTREMITY Upper Extremity    POSITION  Standing    DOMINANT SIDE Right    At Risk Side Right    BASELINE SCORE (UNILATERAL) -4.4    L-DEX SCORE (UNILATERAL) 1.4    VALUE CHANGE (UNILAT) 5.8                Otelia Limes, PTA 01/16/2022, 4:08 PM

## 2022-02-07 ENCOUNTER — Ambulatory Visit: Payer: Managed Care, Other (non HMO)

## 2022-02-07 VITALS — BP 120/87 | HR 114 | Temp 98.7°F | Resp 16 | Ht 64.0 in | Wt 132.8 lb

## 2022-02-07 DIAGNOSIS — K50819 Crohn's disease of both small and large intestine with unspecified complications: Secondary | ICD-10-CM | POA: Diagnosis not present

## 2022-02-07 MED ORDER — VEDOLIZUMAB 300 MG IV SOLR
300.0000 mg | Freq: Once | INTRAVENOUS | Status: AC
  Administered 2022-02-07: 300 mg via INTRAVENOUS
  Filled 2022-02-07: qty 5

## 2022-02-07 NOTE — Progress Notes (Signed)
Diagnosis: Crohn's Disease  Provider:  Marshell Garfinkel MD  Procedure: Infusion  IV Type: Peripheral, IV Location: L Antecubital  Entyvio (Vedolizumab), Dose: 300 mg  Infusion Start Time: 0910  Infusion Stop Time: 6887  Post Infusion IV Care: Peripheral IV Discontinued  Discharge: Condition: Good, Destination: Home . AVS provided to patient.   Performed by:  Koren Shiver, RN

## 2022-03-14 ENCOUNTER — Ambulatory Visit (INDEPENDENT_AMBULATORY_CARE_PROVIDER_SITE_OTHER): Payer: Managed Care, Other (non HMO) | Admitting: Surgical

## 2022-03-14 DIAGNOSIS — Z923 Personal history of irradiation: Secondary | ICD-10-CM | POA: Diagnosis not present

## 2022-03-14 DIAGNOSIS — Z170 Estrogen receptor positive status [ER+]: Secondary | ICD-10-CM

## 2022-03-14 DIAGNOSIS — Z853 Personal history of malignant neoplasm of breast: Secondary | ICD-10-CM

## 2022-03-14 DIAGNOSIS — Z9013 Acquired absence of bilateral breasts and nipples: Secondary | ICD-10-CM | POA: Diagnosis not present

## 2022-03-14 NOTE — Progress Notes (Signed)
   Referring Provider No referring provider defined for this encounter.   CC:  Chief Complaint  Patient presents with   Follow-up      [name] is an 46 y.o. female.  HPI: Patient is a 46 year old female who underwent bilateral breast reconstruction with Dr. Claudia Desanctis.  She most recently underwent removal of bilateral breast tissue expanders and placement of bilateral breast silicone implants with Dr. Claudia Desanctis on 07/25/2021.  She had Mentor high-profile extra implants with 450 cc of volume placed bilaterally.  Of note she does have a history of radiation to the right breast.  She is 8 months postop.  She presents today with concerns of right breast pain and a lump of the right breast just above her nipple areola. She reports the pain is continuing to improve.  Review of Systems General: No fevers or chills  Physical Exam    02/07/2022    9:54 AM 02/07/2022    8:24 AM 01/16/2022    4:05 PM  Vitals with BMI  Height  5' 4"    Weight  132 lbs 13 oz 129 lbs 2 oz  BMI  86.16 83.72  Systolic 902 111   Diastolic 87 77   Pulse 552 117     General:  No acute distress,  Alert and oriented, Non-Toxic, Normal speech and affect Breast: Bilateral breast incisions are intact, well-healed.  She does have radiation damage to the right breast skin which has caused some skin tightening.  Discoloration from radiation noted as well.  Bilateral breast implants are soft, left greater than right.  No capsular contracture is noted.  She does have a small lump that is approximately 1 x 1 cm.  Feels deep to the skin.  It does feel consistent with the back of the implant  Assessment/Plan 46 year old female status post bilateral breast reconstruction after breast cancer and radiation to her right breast.  She reports that she has had a lump in the right breast for a few weeks, she is concerned about this.  Based on my exam I feel as if the area is consistent with the marking that is on the back of Mentor  implants.  It is a similar texture and size.  However given her history, will order ultrasound for confirmation.  I would like to schedule her for follow-up in 1 to 2 weeks with one of the surgeons to discuss options for trying to flip the implant in the office or other possible options.  Unfortunately she was radiated on that side so surgical intervention would pose an increased risk of complications.  Pictures were obtained of the patient and placed in the chart with the patient's or guardian's permission.   [name] [name] 03/14/2022, 10:22 AM

## 2022-03-16 ENCOUNTER — Ambulatory Visit
Admission: RE | Admit: 2022-03-16 | Discharge: 2022-03-16 | Disposition: A | Discharge status: Home / Self Care | Payer: Managed Care, Other (non HMO) | Source: Ambulatory Visit | Attending: Surgical | Admitting: Surgical

## 2022-03-16 ENCOUNTER — Other Ambulatory Visit: Payer: Self-pay | Admitting: Surgical

## 2022-03-16 DIAGNOSIS — N6310 Unspecified lump in the right breast, unspecified quadrant: Secondary | ICD-10-CM

## 2022-03-16 DIAGNOSIS — Z170 Estrogen receptor positive status [ER+]: Secondary | ICD-10-CM

## 2022-03-20 ENCOUNTER — Encounter: Payer: Managed Care, Other (non HMO) | Admitting: Adult Health

## 2022-03-24 ENCOUNTER — Ambulatory Visit
Admission: RE | Admit: 2022-03-24 | Discharge: 2022-03-24 | Disposition: A | Discharge status: Home / Self Care | Payer: Managed Care, Other (non HMO) | Source: Ambulatory Visit | Attending: Surgical | Admitting: Surgical

## 2022-03-24 ENCOUNTER — Ambulatory Visit
Admission: RE | Admit: 2022-03-24 | Discharge: 2022-03-24 | Disposition: A | Discharge status: Home / Self Care | Payer: Managed Care, Other (non HMO) | Source: Ambulatory Visit | Attending: Surgical

## 2022-03-24 ENCOUNTER — Encounter: Payer: Managed Care, Other (non HMO) | Admitting: Adult Health

## 2022-03-24 DIAGNOSIS — N6310 Unspecified lump in the right breast, unspecified quadrant: Secondary | ICD-10-CM

## 2022-03-24 DIAGNOSIS — C50411 Malignant neoplasm of upper-outer quadrant of right female breast: Secondary | ICD-10-CM

## 2022-03-24 HISTORY — PX: BREAST BIOPSY: SHX20

## 2022-03-27 ENCOUNTER — Encounter: Payer: Self-pay | Admitting: Hematology and Oncology

## 2022-03-27 ENCOUNTER — Encounter: Payer: Managed Care, Other (non HMO) | Admitting: Adult Health

## 2022-03-28 NOTE — Progress Notes (Signed)
Patient Care Team: Patient, [name] as PCP - General (General Practice) [name], RN as Oncology Nurse Navigator [name], RN as Oncology Nurse Navigator [name], MD as Consulting Physician (Colon and Rectal Surgery) [name], MD as Consulting Physician (Gastroenterology) [name], MD as Consulting Physician (General Surgery) [name], MD as Consulting Physician (Hematology and Oncology)  DIAGNOSIS: No diagnosis found.  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 palpable right breast mass measuring about 7 cm x 5 and half centimeters, palpable right axillary lymph node with concern for skin invasion    06/08/2020 Cancer Staging   Staging form: Breast, AJCC 8th Edition - Clinical: Stage IIIB (cT4d, cN1, cM0, G3, ER+, PR-, HER2+) - Signed by [name], MD on 07/07/2020 Histologic grading system: 3 grade system   06/24/2020 Genetic Testing   No pathogenic variants detected in [facility] [employer] +RNAinsight.  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  The report date is June 24, 2020.   The CustomNext-Cancer+RNAinsight panel offered by [facility] includes sequencing and rearrangement analysis for the following 47 genes:  APC, ATM, AXIN2, BARD1, BMPR1A, BRCA1, BRCA2, BRIP1, CDH1, CDK4, CDKN2A, CHEK2, DICER1, EPCAM, GREM1, HOXB13, MEN1, MLH1, MSH2, MSH3, MSH6, MUTYH, NBN, NF1, NF2, NTHL1, PALB2, PMS2, POLD1, POLE, PTEN, RAD51C, RAD51D, RECQL, RET, SDHA, SDHAF2, SDHB, SDHC, SDHD, SMAD4, SMARCA4, STK11, TP53, TSC1,  TSC2, and VHL.  RNA data is routinely analyzed for use in variant interpretation for all genes.   06/25/2020 - 10/29/2020 Chemotherapy   TCHP x6 cycles   11/09/2020 Surgery   Bilateral mastectomies: Left mastectomy: Benign Right mastectomy: Residual microinvasive cancer status post neoadjuvant therapy, DCIS, 0/12 lymph nodes, ER +1%, PR negative, HER2 equivocal 2+ by [facility]   11/19/2020 - 07/15/2021 Chemotherapy   Patient is on Treatment Plan : BREAST Trastuzumab  + Pertuzumab q21d x 13 cycles     01/11/2021 - 03/09/2021 Radiation Therapy   Site Technique Total Dose (Gy) Dose per Fx (Gy) Completed Fx Beam Energies  Chest Wall, Right: CW_Rt 3D 50.4/50.4 1.8 28/28 10X  Chest Wall, Right: CW_Rt_SCLV 3D 50.4/50.4 1.8 28/28 6X, 10X  Chest Wall, Right: CW_Rt_Bst Electron 10/10 2 5/5 6E     06/08/2021 - 06/24/2021 Anti-estrogen oral therapy   Tamoxifen (discontinued)     CHIEF COMPLIANT: Follow-up of HER-2 positive right breast cancer   INTERVAL HISTORY: [name] is a 46 y.o. with above-mentioned history of HER-2 positive right breast cancer currently on Herceptin Perjeta maintenance. She presents to the clinic today for follow-up.     ALLERGIES:  is allergic to nsaids and other.  MEDICATIONS:  Current Outpatient Medications  Medication Sig Dispense Refill   acetaminophen (TYLENOL) 500 MG tablet Take 500-1,000 mg by mouth every 6 (six) hours as needed for mild pain.     Multiple Vitamins-Minerals (MULTIVITAMIN WITH MINERALS) tablet Take 1 tablet by mouth daily.     potassium chloride SA (KLOR-CON M) 20 MEQ tablet Take 1 tablet (20 mEq total) by mouth daily. 90 tablet 2   predniSONE (DELTASONE) 10 MG tablet Take  2 tablets (20 mg total) by mouth daily with breakfast. Stay at this dose until you return to Dr. Carlean Purl 100 tablet 0   vedolizumab (ENTYVIO) 300 MG injection Inject into the vein.     No current facility-administered medications for this visit.    PHYSICAL EXAMINATION: ECOG  PERFORMANCE STATUS: {CHL ONC ECOG PS:[phone]}  There were no vitals filed for this visit. There were no vitals filed for this visit.  BREAST:*** No palpable masses or nodules in either right or left breasts. No palpable axillary supraclavicular or infraclavicular adenopathy no breast tenderness or nipple discharge. (exam performed in the presence of a chaperone)  LABORATORY DATA:  I have reviewed the data as listed    Latest Ref Rng & Units 12/27/2021   10:29 AM 12/26/2021    8:07 AM 10/25/2021    3:41 PM  CMP  Glucose 70 - 99 mg/dL 81  81  92   BUN 6 - 23 mg/dL _0 Creatinine 0.40 - 1.20 mg/dL 0.94  0.92  1.05   Sodium 135 - 145 mEq/L 139  143  139   Potassium 3.5 - 5.1 mEq/L 3.0  3.6  3.1   Chloride 96 - 112 mEq/L 101  106  99   CO2 19 - 32 mEq/L _1 Calcium 8.4 - 10.5 mg/dL 9.1  9.1  9.4   Total Protein 6.5 - 8.1 g/dL  6.7  7.4   Total Bilirubin 0.3 - 1.2 mg/dL  0.8  0.6   Alkaline Phos 38 - 126 U/L  74  96   AST 15 - 41 U/L  16  16   ALT 0 - 44 U/L  15  19     Lab Results  Component Value Date   WBC 9.1 12/26/2021   HGB 10.4 (L) 12/26/2021   HCT 33.4 (L) 12/26/2021   MCV 87.0 12/26/2021   PLT 279 12/26/2021   NEUTROABS 6.0 12/26/2021    ASSESSMENT & PLAN:  No problem-specific Assessment & Plan notes found for this encounter.    No orders of the defined types were placed in this encounter.  The patient has a good understanding of the overall plan. she agrees with it. she will call with any problems that may develop before the next visit here. Total time spent: 30 mins including face to face time and time spent for planning, charting and co-ordination of care   Suzzette Righter, [place] 03/28/22    I Gardiner Coins am acting as a Education administrator for [employer]  ***

## 2022-03-29 ENCOUNTER — Encounter: Payer: Self-pay | Admitting: Hematology and Oncology

## 2022-03-29 ENCOUNTER — Inpatient Hospital Stay: Payer: Managed Care, Other (non HMO) | Attending: Hematology and Oncology | Admitting: Hematology and Oncology

## 2022-03-29 VITALS — BP 141/93 | HR 98 | Temp 97.9°F | Resp 16 | Wt 136.4 lb

## 2022-03-29 DIAGNOSIS — C50411 Malignant neoplasm of upper-outer quadrant of right female breast: Secondary | ICD-10-CM | POA: Insufficient documentation

## 2022-03-29 DIAGNOSIS — C50911 Malignant neoplasm of unspecified site of right female breast: Secondary | ICD-10-CM | POA: Diagnosis not present

## 2022-03-29 DIAGNOSIS — Z170 Estrogen receptor positive status [ER+]: Secondary | ICD-10-CM | POA: Diagnosis not present

## 2022-03-29 MED ORDER — TAMOXIFEN CITRATE 10 MG PO TABS: 10.0000 mg | ORAL_TABLET | Freq: Two times a day (BID) | ORAL | 3 refills | Status: DC

## 2022-03-29 NOTE — Assessment & Plan Note (Signed)
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 palpable right breast mass measuring about 7 cm x 5 and half centimeters, palpable right axillary lymph node with concern for skin invasion    Treatment Plan: 1. Neoadjuvant chemo with TCHP q 3 weeks X 6 followed by [facility] completed 07/07/2021 2. Bilateral mastectomies: Left mastectomy: Benign Right mastectomy: Residual microinvasive cancer status post neoadjuvant therapy, DCIS, 0/12 lymph nodes, ER +1%, PR negative, HER2 equivocal 2+ by IHC 3. Adj XRT completed 02/23/2021 4. Adj Anti-estrogen therapy with tamoxifen started 06/08/2021 discontinued 06/24/2021 (ER was only 1% therefore we felt risks outweigh benefits) 5. Neratinib: Because of her bowel issues she decided not to take it. --------------------------------------------------------------------------------------------------------- Hospitalization 05/18/2021-06/13/2021 (small bowel obstruction) Right chest palpable nodule: 1.2 cm mass at 10 o'clock position biopsy revealed grade 2 IDC with high-grade DCIS ER 20% positive weak staining, PR 0%, HER2 3+ positive, Ki-67 50%  Recommendation: Staging scans Excision of the nodule Adjuvant Kadcyla  Return to clinic after surgery

## 2022-03-30 ENCOUNTER — Other Ambulatory Visit: Payer: Self-pay | Admitting: *Deleted

## 2022-03-30 DIAGNOSIS — C50911 Malignant neoplasm of unspecified site of right female breast: Secondary | ICD-10-CM

## 2022-04-01 ENCOUNTER — Telehealth: Payer: Self-pay | Admitting: Hematology and Oncology

## 2022-04-01 NOTE — Telephone Encounter (Signed)
Scheduled appointment per 12/20 los. Left voicemail.

## 2022-04-04 ENCOUNTER — Encounter: Payer: Self-pay | Admitting: Internal Medicine

## 2022-04-04 ENCOUNTER — Telehealth: Payer: Self-pay

## 2022-04-04 ENCOUNTER — Ambulatory Visit: Payer: Managed Care, Other (non HMO)

## 2022-04-04 VITALS — BP 124/78 | HR 83 | Temp 98.9°F | Resp 16 | Ht 64.0 in | Wt 135.6 lb

## 2022-04-04 DIAGNOSIS — K50819 Crohn's disease of both small and large intestine with unspecified complications: Secondary | ICD-10-CM | POA: Diagnosis not present

## 2022-04-04 MED ORDER — VEDOLIZUMAB 300 MG IV SOLR
300.0000 mg | Freq: Once | INTRAVENOUS | Status: AC
  Administered 2022-04-04: 300 mg via INTRAVENOUS
  Filled 2022-04-04: qty 5

## 2022-04-04 NOTE — Telephone Encounter (Signed)
Pt made aware of Dr. Henrene Pastor recommendations:  Pt verbalized understanding with all questions answered.

## 2022-04-04 NOTE — Telephone Encounter (Signed)
Dr. Carlean Purl Pt  [name]  to [facility] Clinical Pool (supporting [name], MD)      04/04/22  7:46 AM Hello,  I was diagnosed with stage 2 breast cancer again last week. I have to get a lumpectomy done next month & immunotherapy starting in February. Should I get the biologic infusion today?  Please advise as DOD

## 2022-04-04 NOTE — Progress Notes (Signed)
Diagnosis: Crohn's Disease  Provider:  Marshell Garfinkel MD  Procedure: Infusion  IV Type: Peripheral, IV Location: L Antecubital  Entyvio (Vedolizumab), Dose: 300 mg  Infusion Start Time: 3893  Infusion Stop Time: 1320  Post Infusion IV Care: Peripheral IV Discontinued  Discharge: Condition: Good, Destination: Home . AVS provided to patient.   Performed by:  Adelina Mings, LPN

## 2022-04-04 NOTE — Telephone Encounter (Signed)
1.  Yes for today 2.  I will forward this note to Dr. Carlean Purl regarding any additional recommendations, or alterations, moving forward.

## 2022-04-05 ENCOUNTER — Encounter: Payer: Self-pay | Admitting: Plastic Surgery

## 2022-04-05 ENCOUNTER — Institutional Professional Consult (permissible substitution): Payer: Managed Care, Other (non HMO) | Admitting: Plastic Surgery

## 2022-04-06 ENCOUNTER — Encounter: Payer: Self-pay | Admitting: Hematology and Oncology

## 2022-04-06 ENCOUNTER — Encounter: Payer: Self-pay | Admitting: *Deleted

## 2022-04-06 ENCOUNTER — Other Ambulatory Visit: Payer: Self-pay | Admitting: *Deleted

## 2022-04-06 ENCOUNTER — Ambulatory Visit (HOSPITAL_COMMUNITY)
Admission: RE | Admit: 2022-04-06 | Discharge: 2022-04-06 | Disposition: A | Discharge status: Home / Self Care | Payer: Managed Care, Other (non HMO) | Source: Ambulatory Visit | Attending: Hematology and Oncology | Admitting: Hematology and Oncology

## 2022-04-06 DIAGNOSIS — Z170 Estrogen receptor positive status [ER+]: Secondary | ICD-10-CM | POA: Insufficient documentation

## 2022-04-06 DIAGNOSIS — C50411 Malignant neoplasm of upper-outer quadrant of right female breast: Secondary | ICD-10-CM | POA: Diagnosis present

## 2022-04-06 DIAGNOSIS — C50911 Malignant neoplasm of unspecified site of right female breast: Secondary | ICD-10-CM | POA: Diagnosis present

## 2022-04-06 MED ORDER — SODIUM CHLORIDE (PF) 0.9 % IJ SOLN
INTRAMUSCULAR | Status: AC
  Filled 2022-04-06: qty 50

## 2022-04-06 MED ORDER — IOHEXOL 300 MG/ML  SOLN
100.0000 mL | Freq: Once | INTRAMUSCULAR | Status: AC | PRN
  Administered 2022-04-06: 100 mL via INTRAVENOUS

## 2022-04-06 NOTE — Progress Notes (Signed)
Pt requesting referral be placed to Cancer Rehab for evaluation and tx of ongoing right upper extremity lymphedema.  RN reviewed with provider and referral placed.

## 2022-04-07 ENCOUNTER — Other Ambulatory Visit: Payer: Self-pay

## 2022-04-07 ENCOUNTER — Telehealth: Payer: Self-pay | Admitting: Hematology and Oncology

## 2022-04-07 ENCOUNTER — Telehealth: Payer: Self-pay

## 2022-04-07 ENCOUNTER — Ambulatory Visit: Payer: Managed Care, Other (non HMO) | Attending: Adult Health | Admitting: Physical Therapy

## 2022-04-07 ENCOUNTER — Ambulatory Visit: Payer: Managed Care, Other (non HMO) | Admitting: Surgical

## 2022-04-07 ENCOUNTER — Encounter: Payer: Self-pay | Admitting: Physical Therapy

## 2022-04-07 DIAGNOSIS — C50411 Malignant neoplasm of upper-outer quadrant of right female breast: Secondary | ICD-10-CM | POA: Diagnosis present

## 2022-04-07 DIAGNOSIS — R293 Abnormal posture: Secondary | ICD-10-CM | POA: Diagnosis present

## 2022-04-07 DIAGNOSIS — Z170 Estrogen receptor positive status [ER+]: Secondary | ICD-10-CM | POA: Insufficient documentation

## 2022-04-07 DIAGNOSIS — C50911 Malignant neoplasm of unspecified site of right female breast: Secondary | ICD-10-CM | POA: Diagnosis not present

## 2022-04-07 DIAGNOSIS — M79644 Pain in right finger(s): Secondary | ICD-10-CM | POA: Diagnosis present

## 2022-04-07 DIAGNOSIS — I890 Lymphedema, not elsewhere classified: Secondary | ICD-10-CM | POA: Diagnosis present

## 2022-04-07 NOTE — Telephone Encounter (Signed)
Contacted patient to scheduled appointments. Left message with appointment details and a call back number if patient had any questions or could not accommodate the time we provided.   

## 2022-04-07 NOTE — Therapy (Signed)
OUTPATIENT PHYSICAL THERAPY ONCOLOGY EVALUATION  Patient Name: [name] MRN: [id number] DOB:May 12, 1975, 46 y.o., female Today's Date: 04/07/2022  END OF SESSION:  PT End of Session - 04/07/22 0904     Visit Number 1    Number of Visits 5    Date for PT Re-Evaluation 05/05/22    PT Start Time 0803    PT Stop Time 4193    PT Time Calculation (min) 52 min             Past Medical History:  Diagnosis Date   Cancer ([place])    Colon stricture ([place]) 05/07/2019   Crohn's colitis, with intestinal obstruction ([place]) 09/17/2013   Dx 2006 - right and left colon involved 2011 - pan-colitis 09/17/2013 left colitis 60-20 cm March 2021 subtotal colectomy ileosigmoid anastomosis for colonic strictures-Dr. Marcello Moores     Crohn's disease [facility])    Family history of breast cancer 06/11/2020   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 10/17/2010   Rectal bleeding    Uterine fibroid    Vitamin D deficiency 09/24/2013   Past Surgical History:  Procedure Laterality Date   AXILLARY LYMPH NODE DISSECTION Right 11/09/2020   Procedure: RIGHT AXILLARY LYMPH NODE DISSECTION;  Surgeon: [name], MD;  Location: [place];  Service: General;  Laterality: Right;   BIOPSY  07/01/2020   Procedure: BIOPSY;  Surgeon: Yetta Flock, MD;  [facility];  Service: Gastroenterology;;   BIOPSY  01/27/2021   Procedure: BIOPSY;  Surgeon: Yetta Flock, MD;  [facility];  Service: Gastroenterology;;   BIOPSY  09/07/2021   Procedure: BIOPSY;  Surgeon: Jackquline Denmark, MD;  [facility];  Service: Gastroenterology;;   BREAST BIOPSY Right 03/24/2022   [place] RT BREAST BX W LOC DEV 1ST LESION IMG BX SPEC US GUIDE 03/24/2022 [facility]   BREAST RECONSTRUCTION WITH PLACEMENT OF TISSUE EXPANDER AND FLEX HD (ACELLULAR HYDRATED DERMIS) Bilateral 11/09/2020   Procedure: BILATERAL BREAST RECONSTRUCTION WITH PLACEMENT OF TISSUE  EXPANDER AND FLEX HD (ACELLULAR HYDRATED DERMIS);  Surgeon: [name], MD;  Location: [place];  Service: Plastics;  Laterality: Bilateral;   CAPSULOTOMY Bilateral 07/25/2021   Procedure: CAPSULOTOMY;  Surgeon: [name], MD;  Location: [place];  Service: Plastics;  Laterality: Bilateral;   COLECTOMY  06/27/2019   COLONOSCOPY  2015   FLEXIBLE SIGMOIDOSCOPY N/A 07/01/2020   Procedure: FLEXIBLE SIGMOIDOSCOPY;  Surgeon: Yetta Flock, MD;  [facility];  Service: Gastroenterology;  Laterality: N/A;   FLEXIBLE SIGMOIDOSCOPY N/A 01/27/2021   Procedure: FLEXIBLE SIGMOIDOSCOPY;  Surgeon: Yetta Flock, MD;  [facility];  Service: Gastroenterology;  Laterality: N/A;   FLEXIBLE SIGMOIDOSCOPY N/A 09/07/2021   Procedure: FLEXIBLE SIGMOIDOSCOPY;  Surgeon: Jackquline Denmark, MD;  [facility];  Service: Gastroenterology;  Laterality: N/A;   NIPPLE SPARING MASTECTOMY Bilateral 11/09/2020   Procedure: BILATERAL NIPPLE SPARING MASTECTOMY;  Surgeon: [name], MD;  Location: [place];  Service: General;  Laterality: Bilateral;   PORT-A-CATH REMOVAL N/A 07/25/2021   Procedure: REMOVAL PORT-A-CATH;  Surgeon: [name], MD;  Location: [place];  Service: Plastics;  Laterality: N/A;   PORTACATH PLACEMENT N/A 06/23/2020   Procedure: INSERTION PORT-A-CATH;  Surgeon: [name], MD;  Location: [place];  Service: General;  Laterality: N/A;  START TIME OF 11:00 AM FOR 60 MINUTES [place] [facility]   REMOVAL OF BILATERAL TISSUE EXPANDERS WITH PLACEMENT OF  BILATERAL BREAST IMPLANTS Bilateral 07/25/2021   Procedure: REMOVAL OF BILATERAL TISSUE EXPANDERS WITH PLACEMENT OF BILATERAL BREAST IMPLANTS;  Surgeon: [name], MD;  Location: [place];  Service: Plastics;  Laterality: Bilateral;  1.5   Patient Active Problem List   Diagnosis Date Noted    Leucocytosis 05/27/2021   Crohn's disease of small and large intestines with complication ([place])    SBO (small bowel obstruction) ([place]) 01/26/2021   History of Crohn's disease    Breast cancer ([place]) 11/09/2020   Port-A-Cath in place 07/15/2020   Genetic testing 06/25/2020   Family history of breast cancer 06/11/2020   Malignant neoplasm of upper-outer quadrant of right breast in female, estrogen receptor positive ([place]) 06/04/2020   Abnormal finding present on diagnostic imaging of uterus 05/07/2019   Vitamin D deficiency 09/24/2013   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 10/17/2010    PCP: Does not have one  REFERRING PROVIDER: Wilber Bihari, NP  REFERRING DIAG: C50.911 (ICD-10-CM) - Recurrent breast cancer, right ([place])  THERAPY DIAG:  Lymphedema, not elsewhere classified  Pain of right thumb  Abnormal posture  Carcinoma of upper-outer quadrant of right breast in female, estrogen receptor positive ([place])  ONSET DATE: 01/16/22  Rationale for Evaluation and Treatment: Rehabilitation  SUBJECTIVE:                                                                                                                                                                                           SUBJECTIVE STATEMENT: I have been wearing my sleeve all day every day since my SOZO number started increasing around Thanksgiving. My hand has been swelling worse since Thanksgiving despite this. I also have pain in my thumb that goes down my wrist. I do not notice a lot of swelling in my arm but I do have swelling on my side.  PERTINENT HISTORY:  Patient was diagnosed on 06/04/2020 with right grade III invasive ductal carcinoma breast cancer. She neoadjuvant chemotherapy from 06/25/2020 - 10/29/2020 followed by bilateral mastectomies with expanders placed on 11/09/2020 with 0/12 axillary nodes positive for cancer. It is ER positive, PR negative, and HER2 negative with a Ki67  of 40%. 03/28/22- diagnosed with R recurrence and will undergo a lumpectomy PAIN:  Are you having pain? Yes NPRS scale: 9/10 when moving R thumb no pain in sitting Pain location: R thumb Pain orientation: Right  PAIN TYPE: sharp Pain description: intermittent  Aggravating factors: moving the thumb, lifting Relieving factors: not doing anything  PRECAUTIONS: Other: active cancer - recurrent R breast cancer  WEIGHT BEARING RESTRICTIONS: No  FALLS:  Has  patient fallen in last 6 months? No  LIVING ENVIRONMENT: Lives with: lives with their daughter and grandson who is 45 months old Lives in: House/apartment Stairs: No;  Has following equipment at home: None  OCCUPATION: full time - Careers information officer  LEISURE: walks, lifts weights 5lbs, squats 2x/wk  HAND DOMINANCE: right   PRIOR LEVEL OF FUNCTION: Independent  PATIENT GOALS: to get the swelling down and the pain to stop   OBJECTIVE:  COGNITION: Overall cognitive status: Within functional limits for tasks assessed   PALPATION: Tenderness along thumb extending to wrist  OBSERVATIONS / OTHER ASSESSMENTS: R hand is visibly swollen  POSTURE: forward head, rounded shoulders  UPPER EXTREMITY AROM/PROM: WFL   LYMPHEDEMA ASSESSMENTS:   SURGERY TYPE/DATE: bilateral mastectomy 11/09/20 and will need a lumpectomy for recent recurrence on R side  NUMBER OF LYMPH NODES REMOVED: 0/12  CHEMOTHERAPY: completed  RADIATION:completed 2022  LYMPHEDEMA ASSESSMENTS:   LANDMARK RIGHT  eval  10 cm proximal to olecranon process 25.4  Olecranon process 22.2  10 cm proximal to ulnar styloid process 18.4  Just proximal to ulnar styloid process 15.3  Across hand at thumb web space 19.2  At base of 2nd digit 6.5  (Blank rows = not tested)  LANDMARK LEFT  eval  10 cm proximal to olecranon process 25.5  Olecranon process 23  10 cm proximal to ulnar styloid process 18.4  Just proximal to ulnar styloid process 14.5  Across hand at thumb  web space 17.9  At base of 2nd digit 5.7  (Blank rows = not tested)    L-DEX LYMPHEDEMA SCREENING:  The patient was assessed using the L-Dex machine today to produce a lymphedema index baseline score. The patient will be reassessed on a regular basis (typically every 3 months) to obtain new L-Dex scores. If the score is > 6.5 points away from his/her baseline score indicating onset of subclinical lymphedema, it will be recommended to wear a compression garment for 4 weeks, 12 hours per day and then be reassessed. If the score continues to be > 6.5 points from baseline at reassessment, we will initiate lymphedema treatment. Assessing in this manner has a 95% rate of preventing clinically significant lymphedema.   L-DEX FLOWSHEETS - 04/07/22 0900       L-DEX LYMPHEDEMA SCREENING   Measurement Type Unilateral    L-DEX MEASUREMENT EXTREMITY Upper Extremity    POSITION  Standing    DOMINANT SIDE Right    At Risk Side Right    BASELINE SCORE (UNILATERAL) -4.4    L-DEX SCORE (UNILATERAL) 6.7    VALUE CHANGE (UNILAT) 11.1             QUICK DASH SURVEY:   Katina Dung - 04/07/22 0001     Open a tight or new jar Severe difficulty    Do heavy household chores (wash walls, wash floors) Moderate difficulty    Carry a shopping bag or briefcase Mild difficulty    Wash your back Moderate difficulty    Use a knife to cut food Mild difficulty    Recreational activities in which you take some force or impact through your arm, shoulder, or hand (golf, hammering, tennis) Moderate difficulty    During the past week, to what extent has your arm, shoulder or hand problem interfered with your normal social activities with family, friends, neighbors, or groups? Modererately    During the past week, to what extent has your arm, shoulder or hand problem limited your work or other regular  daily activities Modererately    Arm, shoulder, or hand pain. Moderate    Tingling (pins and needles) in your arm,  shoulder, or hand None    Difficulty Sleeping Mild difficulty    DASH Score 40.91 %             Quick Dash - 04/07/22 0001     Open a tight or new jar Severe difficulty    Do heavy household chores (wash walls, wash floors) Moderate difficulty    Carry a shopping bag or briefcase Mild difficulty    Wash your back Moderate difficulty    Use a knife to cut food Mild difficulty    Recreational activities in which you take some force or impact through your arm, shoulder, or hand (golf, hammering, tennis) Moderate difficulty    During the past week, to what extent has your arm, shoulder or hand problem interfered with your normal social activities with family, friends, neighbors, or groups? Modererately    During the past week, to what extent has your arm, shoulder or hand problem limited your work or other regular daily activities Modererately    Arm, shoulder, or hand pain. Moderate    Tingling (pins and needles) in your arm, shoulder, or hand None    Difficulty Sleeping Mild difficulty    DASH Score 40.91 %               LLIS: 23.53  TODAY'S TREATMENT:                                                                                                                                         DATE:  04/07/22: Educated pt to purchase a brace for her wrist and thumb to immobilize her thumb to allow it to heal. Issued 3 exercises to help decrease pain along tendons of R thumb. Educated her to wear the brace during daytime hours for about a week and to avoid twisting motions and lifting. Also educated pt that she may need a custom compression sleeve but will assess for this at next visit.   PATIENT EDUCATION:  Education details: tenosynovitis and causes, need for wrist splint, exercises to help with thumb pain, lymphedema vs subclinical lymphedema Person educated: Patient Education method: Explanation, Demonstration, and Handouts Education comprehension: verbalized understanding and  returned demonstration  HOME EXERCISE PROGRAM: , Thumb lifts, Place your hand on a flat surface, with your palm up. Lift your thumb away from your palm to make a "C" shape. Hold for about 6 seconds. Repeat 8 to 12 times. Passive thumb MP flexion, Hold your hand in front of you, and turn your hand so your little finger faces down and your thumb faces up. (Your hand should be in the position used for shaking someone's hand.) You may also rest your hand on a flat surface. Use the fingers on your other hand to bend your thumb down  at the point where your thumb connects to your palm. Hold for at least 15 to 30 seconds. Repeat 2 to 4 times. Finkelstein stretch, Hold your arms out in front of you. (Your hand should be in the position used for shaking someone's hand.) Bend your thumb toward your palm. Use your other hand to gently stretch your thumb and wrist downward until you feel a stretch on the thumb side of your wrist. Hold for at least 15 to 30 seconds. Repeat 2 to 4 times. It's a good idea to repeat these steps with your other wrist.  ASSESSMENT:  CLINICAL IMPRESSION: Patient is a 46 y.o. female who was seen today for physical therapy evaluation and treatment for LUE lymphedema and pain in L wrist and thumb. Pt has a high Ldex score back in Feb 2023 but after wearing the compression sleeve for 4 weeks it returned to the green and her subclinical lymphedema was reversed. Her SOZO screen in October was high but was not in the yellow indicating subclinical lymphedema. Pt reports that after Thanksgiving she began seeing increased swelling in her hand and began wearing her sleeve for 12 hours a day for 4 weeks but she did not have a glove and her hand continued to swell. There is noticeable swelling in her hand compared to the L hand. Her arm does not demonstrate any edema. She ordered a new sleeve off of [place] and has been using that since the other one was too large and sliding down. Explained  to pt that there is no way to verify that the compression level is correct on a sleeve from [employer]. Pt's ldex score today was in the red and she has visible lymphedema in her hand. She also reports thumb pain that could be DeQuervains tenosynovitis as tests for this were positive. Educated pt on obtaining a wrist brace and wearing it during the day for a week to rest it. Gave her exercises to begin doing to help. Pt would benefit from skilled PT services to decrease RUE lymphedema, assist pt with obtaining appropriate compression garments, decrease thumb and wrist pain and ensure independent management of lymphedema.    OBJECTIVE IMPAIRMENTS: decreased knowledge of condition, decreased knowledge of use of DME, increased edema, increased fascial restrictions, impaired UE functional use, and pain.   ACTIVITY LIMITATIONS: carrying, lifting, and caring for others  PARTICIPATION LIMITATIONS: meal prep, cleaning, and occupation  PERSONAL FACTORS: Time since onset of injury/illness/exacerbation are also affecting patient's functional outcome.   REHAB POTENTIAL: Good  CLINICAL DECISION MAKING: Stable/uncomplicated  EVALUATION COMPLEXITY: Low  GOALS: Goals reviewed with patient? Yes  LONG TERM GOALS=SHORT TERM GOALS Target date: 05/05/22  Pt will report no pain in thumb and wrist when moving her thumb.  Baseline:  Goal status: INITIAL  2.  Pt will obtain an appropriate compression sleeve and glove for long term management of lymphedema.  Baseline:  Goal status: INITIAL  4.  Pt will be independent in self MLD for long term management of lymphedema  Baseline:  Goal status: INITIAL  5.  Pt will be independent in a home exercise program to decrease thumb and wrist pain on R side.  Baseline:  Goal status: INITIAL  6.  Pt will demonstrate a 0.5 decrease in edema at dorsum of hand to allow improved comfort.  Baseline:  Goal status: INITIAL  PLAN:  PT FREQUENCY: 1x/week  PT DURATION: 4  weeks  PLANNED INTERVENTIONS: Therapeutic exercises, Therapeutic activity, Patient/Family education, Self Care, Joint mobilization, Orthotic/Fit  training, DME instructions, Manual lymph drainage, Compression bandaging, scar mobilization, Taping, Manual therapy, and Re-evaluation  PLAN FOR NEXT SESSION: how were tenosynovitis exercises? Did she get the brace for her wrist/thumb? Measure for a compression sleeve and glove - does she fit in to off the shelf or does she need a custom (demo sent to [facility] on 04/07/22) teach MLD to [employer], PT 04/07/2022, 12:23 PM

## 2022-04-07 NOTE — Telephone Encounter (Signed)
Called and LVM with below message. Gave number for Pt to call back with any further questions.

## 2022-04-07 NOTE — Telephone Encounter (Signed)
-----   Message from Gardenia Phlegm, NP sent at 04/06/2022  4:47 PM EST ----- No cancer on scans.  VG will review results in detail with her on 04/14/2022 ----- Message ----- From: Interface, Rad Results In Sent: 04/06/2022  12:56 PM EST To: [name], MD

## 2022-04-10 NOTE — Progress Notes (Incomplete)
HEMATOLOGY-ONCOLOGY TELEPHONE VISIT PROGRESS NOTE  I connected with our patient on 04/10/22 at  9:45 AM EST by telephone and verified that I am speaking with the correct person using two identifiers.  I discussed the limitations, risks, security and privacy concerns of performing an evaluation and management service by telephone and the availability of in person appointments.  I also discussed with the patient that there may be a patient responsible charge related to this service. The patient expressed understanding and agreed to proceed.   History of Present Illness: [name] is a 47 y.o. with above-mentioned history of HER-2 positive right breast cancer currently on Herceptin Perjeta maintenance. She presents to the clinic today via  follow-up.      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 palpable right breast mass measuring about 7 cm x 5 and half centimeters, palpable right axillary lymph node with concern for skin invasion    06/08/2020 Cancer Staging   Staging form: Breast, AJCC 8th Edition - Clinical: Stage IIIB (cT4d, cN1, cM0, G3, ER+, PR-, HER2+) - Signed by [name], MD on 07/07/2020 Histologic grading system: 3 grade system   06/24/2020 Genetic Testing   No pathogenic variants detected in [employer] [facility].  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  The report date is June 24, 2020.   The [facility]Next-Cancer+RNAinsight panel offered by [facility] includes sequencing and rearrangement analysis for the following 47 genes:  APC, ATM, AXIN2, BARD1, BMPR1A,  BRCA1, BRCA2, BRIP1, CDH1, CDK4, CDKN2A, CHEK2, DICER1, EPCAM, GREM1, HOXB13, MEN1, MLH1, MSH2, MSH3, MSH6, MUTYH, NBN, NF1, NF2, NTHL1, PALB2, PMS2, POLD1, POLE, PTEN, RAD51C, RAD51D, RECQL, RET, SDHA, SDHAF2, SDHB, SDHC, SDHD, SMAD4, SMARCA4, STK11, TP53, TSC1, TSC2, and VHL.  RNA data is routinely analyzed for use in variant interpretation for all genes.   06/25/2020 - 10/29/2020 Chemotherapy   TCHP x6 cycles   11/09/2020 Surgery   Bilateral mastectomies: Left mastectomy: Benign Right mastectomy: Residual microinvasive cancer status post neoadjuvant therapy, DCIS, 0/12 lymph nodes, ER +1%, PR negative, HER2 equivocal 2+ by [facility]   11/19/2020 - 07/15/2021 Chemotherapy   Patient is on Treatment Plan : BREAST Trastuzumab  + Pertuzumab q21d x 13 cycles     01/11/2021 - 03/09/2021 Radiation Therapy   Site Technique Total Dose (Gy) Dose per Fx (Gy) Completed Fx Beam Energies  Chest Wall, Right: CW_Rt 3D 50.4/50.4 1.8 28/28 10X  Chest Wall, Right: CW_Rt_SCLV 3D 50.4/50.4 1.8 28/28 6X, 10X  Chest Wall, Right: CW_Rt_Bst Electron 10/10 2 5/5 6E     06/08/2021 - 06/24/2021 Anti-estrogen oral therapy   Tamoxifen (discontinued)     REVIEW OF SYSTEMS:   Constitutional: Denies fevers, chills or abnormal weight loss All other systems were reviewed with the patient and are negative. Observations/Objective:     Assessment Plan:  No problem-specific Assessment & Plan notes found for this encounter.    I discussed the assessment and treatment plan with the patient. The patient was provided an opportunity to ask questions and all were answered. The patient agreed with the plan and demonstrated an understanding of the instructions. The  patient was advised to call back or seek an in-person evaluation if the symptoms worsen or if the condition fails to improve as anticipated.   I provided *** minutes of non-face-to-face time during this encounter.  This includes time for charting and coordination of care    [place], CMA  I Damond Borchers, Madell Heino am acting as a Education administrator for Dr.Vinay [employer]  ***

## 2022-04-11 ENCOUNTER — Encounter (HOSPITAL_COMMUNITY)
Admission: RE | Admit: 2022-04-11 | Discharge: 2022-04-11 | Disposition: A | Discharge status: Home / Self Care | Payer: Managed Care, Other (non HMO) | Source: Ambulatory Visit | Attending: Hematology and Oncology | Admitting: Hematology and Oncology

## 2022-04-11 DIAGNOSIS — C50411 Malignant neoplasm of upper-outer quadrant of right female breast: Secondary | ICD-10-CM | POA: Insufficient documentation

## 2022-04-11 DIAGNOSIS — C50911 Malignant neoplasm of unspecified site of right female breast: Secondary | ICD-10-CM | POA: Diagnosis present

## 2022-04-11 DIAGNOSIS — Z170 Estrogen receptor positive status [ER+]: Secondary | ICD-10-CM | POA: Diagnosis present

## 2022-04-11 MED ORDER — TECHNETIUM TC 99M MEDRONATE IV KIT
21.9000 | PACK | Freq: Once | INTRAVENOUS | Status: AC
  Administered 2022-04-11: 21.9 via INTRAVENOUS

## 2022-04-12 ENCOUNTER — Encounter: Payer: Self-pay | Admitting: Hematology and Oncology

## 2022-04-13 ENCOUNTER — Encounter: Payer: Self-pay | Admitting: *Deleted

## 2022-04-13 ENCOUNTER — Inpatient Hospital Stay: Payer: Managed Care, Other (non HMO) | Attending: Hematology and Oncology | Admitting: Hematology and Oncology

## 2022-04-13 DIAGNOSIS — Z170 Estrogen receptor positive status [ER+]: Secondary | ICD-10-CM

## 2022-04-13 DIAGNOSIS — C50411 Malignant neoplasm of upper-outer quadrant of right female breast: Secondary | ICD-10-CM | POA: Insufficient documentation

## 2022-04-13 NOTE — Progress Notes (Signed)
HEMATOLOGY-ONCOLOGY TELEPHONE VISIT PROGRESS NOTE  I connected with our patient on 04/13/22 at  1:30 PM EST by telephone and verified that I am speaking with the correct person using two identifiers.  I discussed the limitations, risks, security and privacy concerns of performing an evaluation and management service by telephone and the availability of in person appointments.  I also discussed with the patient that there may be a patient responsible charge related to this service. The patient expressed understanding and agreed to proceed.   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 palpable right breast mass measuring about 7 cm x 5 and half centimeters, palpable right axillary lymph node with concern for skin invasion    06/08/2020 Cancer Staging   Staging form: Breast, AJCC 8th Edition - Clinical: Stage IIIB (cT4d, cN1, cM0, G3, ER+, PR-, HER2+) - Signed by [name], MD on 07/07/2020 Histologic grading system: 3 grade system   06/24/2020 Genetic Testing   No pathogenic variants detected in [facility]RNA[facility]ight.  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  The report date is June 24, 2020.   The CustomNext-Cancer+RNAinsight panel offered by [facility] includes sequencing and rearrangement analysis for the following 47 genes:  APC, ATM, AXIN2, BARD1, BMPR1A, BRCA1, BRCA2, BRIP1, CDH1, CDK4, CDKN2A, CHEK2, DICER1, EPCAM, GREM1, HOXB13, MEN1, MLH1, MSH2, MSH3, MSH6, MUTYH, NBN, NF1, NF2,  NTHL1, PALB2, PMS2, POLD1, POLE, PTEN, RAD51C, RAD51D, RECQL, RET, SDHA, SDHAF2, SDHB, SDHC, SDHD, SMAD4, SMARCA4, STK11, TP53, TSC1, TSC2, and VHL.  RNA data is routinely analyzed for use in variant interpretation for all genes.   06/25/2020 - 10/29/2020 Chemotherapy   TCHP x6 cycles   11/09/2020 Surgery   Bilateral mastectomies: Left mastectomy: Benign Right mastectomy: Residual microinvasive cancer status post neoadjuvant therapy, DCIS, 0/12 lymph nodes, ER +1%, PR negative, HER2 equivocal 2+ by [facility]   11/19/2020 - 07/15/2021 Chemotherapy   Patient is on Treatment Plan : BREAST Trastuzumab  + Pertuzumab q21d x 13 cycles     01/11/2021 - 03/09/2021 Radiation Therapy   Site Technique Total Dose (Gy) Dose per Fx (Gy) Completed Fx Beam Energies  Chest Wall, Right: CW_Rt 3D 50.4/50.4 1.8 28/28 10X  Chest Wall, Right: CW_Rt_SCLV 3D 50.4/50.4 1.8 28/28 6X, 10X  Chest Wall, Right: CW_Rt_Bst Electron 10/10 2 5/5 6E     06/08/2021 - 06/24/2021 Anti-estrogen oral therapy   Tamoxifen (discontinued)     REVIEW OF SYSTEMS:   Constitutional: Denies fevers, chills or abnormal weight loss All other systems were reviewed with the patient and are negative. 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 palpable right breast mass measuring about 7 cm x 5 and half centimeters, palpable right axillary lymph  node with concern for skin invasion    Treatment Plan: 1. Neoadjuvant chemo with TCHP q 3 weeks X 6 followed by HP completed 07/07/2021 2. Bilateral mastectomies: Left mastectomy: Benign Right mastectomy: Residual microinvasive cancer status post neoadjuvant therapy, DCIS, 0/12  lymph nodes, ER +1%, PR negative, HER2 equivocal 2+ by IHC 3. Adj XRT completed 02/23/2021 4. Adj Anti-estrogen therapy with tamoxifen started 06/08/2021 discontinued 06/24/2021 (ER was only 1% therefore we felt risks outweigh benefits) 5. Neratinib: Because of her bowel issues she decided not to take it. --------------------------------------------------------------------------------------------------------- Hospitalization 05/18/2021-06/13/2021 (small bowel obstruction) Right chest palpable nodule: 1.2 cm mass at 10 o'clock position biopsy revealed grade 2 IDC with high-grade DCIS ER 20% positive weak staining, PR 0%, HER2 3+ positive, Ki-67 50%   Recommendation: Staging scans: CT CAP 04/06/2022: No evidence of metastatic disease.  Surgical changes in the colon as well as inflammation from Crohn's enteritis. Bone scan 04/12/2022: Right fifth rib uptake.  I discussed with radiology it was present in 2022.  However there is no CT correlate for this finding and hence a biopsy is not possible.  We will watch and monitor this. Excision of the nodule Adjuvant Kadcyla   Return to clinic after surgery   I discussed the assessment and treatment plan with the patient. The patient was provided an opportunity to ask questions and all were answered. The patient agreed with the plan and demonstrated an understanding of the instructions. The patient was advised to call back or seek an in-person evaluation if the symptoms worsen or if the condition fails to improve as anticipated.   I provided 12 minutes of non-face-to-face time during this encounter.  This includes time for charting and coordination of care   Harriette Ohara, MD

## 2022-04-13 NOTE — Assessment & Plan Note (Signed)
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 palpable right breast mass measuring about 7 cm x 5 and half centimeters, palpable right axillary lymph node with concern for skin invasion    Treatment Plan: 1. Neoadjuvant chemo with TCHP q 3 weeks X 6 followed by HP completed 07/07/2021 2. Bilateral mastectomies: Left mastectomy: Benign Right mastectomy: Residual microinvasive cancer status post neoadjuvant therapy, DCIS, 0/12 lymph nodes, ER +1%, PR negative, HER2 equivocal 2+ by IHC 3. Adj XRT completed 02/23/2021 4. Adj Anti-estrogen therapy with tamoxifen started 06/08/2021 discontinued 06/24/2021 (ER was only 1% therefore we felt risks outweigh benefits) 5. Neratinib: Because of her bowel issues she decided not to take it. --------------------------------------------------------------------------------------------------------- Hospitalization 05/18/2021-06/13/2021 (small bowel obstruction) Right chest palpable nodule: 1.2 cm mass at 10 o'clock position biopsy revealed grade 2 IDC with high-grade DCIS ER 20% positive weak staining, PR 0%, HER2 3+ positive, Ki-67 50%   Recommendation: Staging scans: CT CAP 04/06/2022: No evidence of metastatic disease.  Surgical changes in the colon as well as inflammation from Crohn's enteritis. Bone scan 04/12/2022: Pending Excision of the nodule Adjuvant Kadcyla   Return to clinic after surgery

## 2022-04-14 ENCOUNTER — Telehealth: Payer: Managed Care, Other (non HMO) | Admitting: Hematology and Oncology

## 2022-04-14 ENCOUNTER — Other Ambulatory Visit: Payer: Self-pay | Admitting: General Surgery

## 2022-04-14 DIAGNOSIS — Z170 Estrogen receptor positive status [ER+]: Secondary | ICD-10-CM

## 2022-04-17 ENCOUNTER — Ambulatory Visit: Payer: Managed Care, Other (non HMO)

## 2022-04-18 ENCOUNTER — Encounter: Payer: Self-pay | Admitting: *Deleted

## 2022-04-18 ENCOUNTER — Other Ambulatory Visit: Payer: Managed Care, Other (non HMO)

## 2022-04-18 ENCOUNTER — Other Ambulatory Visit: Payer: Self-pay | Admitting: General Surgery

## 2022-04-18 DIAGNOSIS — Z170 Estrogen receptor positive status [ER+]: Secondary | ICD-10-CM

## 2022-04-19 ENCOUNTER — Ambulatory Visit
Admission: RE | Admit: 2022-04-19 | Discharge: 2022-04-19 | Disposition: A | Discharge status: Home / Self Care | Payer: Managed Care, Other (non HMO) | Source: Ambulatory Visit | Attending: Hematology and Oncology | Admitting: Hematology and Oncology

## 2022-04-19 DIAGNOSIS — C50911 Malignant neoplasm of unspecified site of right female breast: Secondary | ICD-10-CM

## 2022-04-19 MED ORDER — GADOPICLENOL 0.5 MMOL/ML IV SOLN
6.0000 mL | Freq: Once | INTRAVENOUS | Status: AC | PRN
  Administered 2022-04-19: 6 mL via INTRAVENOUS

## 2022-04-20 ENCOUNTER — Ambulatory Visit: Payer: Managed Care, Other (non HMO) | Attending: Adult Health

## 2022-04-20 DIAGNOSIS — I890 Lymphedema, not elsewhere classified: Secondary | ICD-10-CM | POA: Diagnosis present

## 2022-04-20 DIAGNOSIS — C50411 Malignant neoplasm of upper-outer quadrant of right female breast: Secondary | ICD-10-CM | POA: Diagnosis present

## 2022-04-20 DIAGNOSIS — Z170 Estrogen receptor positive status [ER+]: Secondary | ICD-10-CM

## 2022-04-20 DIAGNOSIS — M79644 Pain in right finger(s): Secondary | ICD-10-CM | POA: Diagnosis present

## 2022-04-20 DIAGNOSIS — R293 Abnormal posture: Secondary | ICD-10-CM

## 2022-04-20 NOTE — Therapy (Signed)
OUTPATIENT PHYSICAL THERAPY ONCOLOGY EVALUATION  Patient Name: [name] MRN: [id number] DOB:1975/12/20, 47 y.o., female Today's Date: 04/20/2022  END OF SESSION:  PT End of Session - 04/20/22 1603     Visit Number 2    Number of Visits 5    Date for PT Re-Evaluation 05/05/22    PT Start Time 2542    PT Stop Time 7062    PT Time Calculation (min) 53 min    Activity Tolerance Patient tolerated treatment well    Behavior During Therapy [facility] for tasks assessed/performed             Past Medical History:  Diagnosis Date   Cancer ([place])    Colon stricture ([place]) 05/07/2019   Crohn's colitis, with intestinal obstruction ([place]) 09/17/2013   Dx 2006 - right and left colon involved 2011 - pan-colitis 09/17/2013 left colitis 60-20 cm March 2021 subtotal colectomy ileosigmoid anastomosis for colonic strictures-Dr. Marcello Moores     Crohn's disease [facility])    Family history of breast cancer 06/11/2020   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 10/17/2010   Rectal bleeding    Uterine fibroid    Vitamin D deficiency 09/24/2013   Past Surgical History:  Procedure Laterality Date   AXILLARY LYMPH NODE DISSECTION Right 11/09/2020   Procedure: RIGHT AXILLARY LYMPH NODE DISSECTION;  Surgeon: [name], MD;  Location: [place];  Service: General;  Laterality: Right;   BIOPSY  07/01/2020   Procedure: BIOPSY;  Surgeon: Yetta Flock, MD;  [facility];  Service: Gastroenterology;;   BIOPSY  01/27/2021   Procedure: BIOPSY;  Surgeon: Yetta Flock, MD;  [facility];  Service: Gastroenterology;;   BIOPSY  09/07/2021   Procedure: BIOPSY;  Surgeon: Jackquline Denmark, MD;  [facility];  Service: Gastroenterology;;   BREAST BIOPSY Right 03/24/2022   [place] RT BREAST BX W LOC DEV 1ST LESION IMG BX SPEC US GUIDE 03/24/2022 [facility] MAMMOGRAPHY   BREAST RECONSTRUCTION WITH PLACEMENT OF TISSUE EXPANDER AND FLEX HD  (ACELLULAR HYDRATED DERMIS) Bilateral 11/09/2020   Procedure: BILATERAL BREAST RECONSTRUCTION WITH PLACEMENT OF TISSUE EXPANDER AND FLEX HD (ACELLULAR HYDRATED DERMIS);  Surgeon: [name], MD;  Location: [place];  Service: Plastics;  Laterality: Bilateral;   CAPSULOTOMY Bilateral 07/25/2021   Procedure: CAPSULOTOMY;  Surgeon: [name], MD;  Location: [place];  Service: Plastics;  Laterality: Bilateral;   COLECTOMY  06/27/2019   COLONOSCOPY  2015   FLEXIBLE SIGMOIDOSCOPY N/A 07/01/2020   Procedure: FLEXIBLE SIGMOIDOSCOPY;  Surgeon: Yetta Flock, MD;  [facility];  Service: Gastroenterology;  Laterality: N/A;   FLEXIBLE SIGMOIDOSCOPY N/A 01/27/2021   Procedure: FLEXIBLE SIGMOIDOSCOPY;  Surgeon: Yetta Flock, MD;  [facility];  Service: Gastroenterology;  Laterality: N/A;   FLEXIBLE SIGMOIDOSCOPY N/A 09/07/2021   Procedure: FLEXIBLE SIGMOIDOSCOPY;  Surgeon: Jackquline Denmark, MD;  [facility];  Service: Gastroenterology;  Laterality: N/A;   NIPPLE SPARING MASTECTOMY Bilateral 11/09/2020   Procedure: BILATERAL NIPPLE SPARING MASTECTOMY;  Surgeon: [name], MD;  Location: [place];  Service: General;  Laterality: Bilateral;   PORT-A-CATH REMOVAL N/A 07/25/2021   Procedure: REMOVAL PORT-A-CATH;  Surgeon: [name], MD;  Location: [place];  Service: Plastics;  Laterality: N/A;   PORTACATH PLACEMENT N/A 06/23/2020   Procedure: INSERTION PORT-A-CATH;  Surgeon: [name], MD;  Location: [place];  Service: General;  Laterality: N/A;  START  TIME OF 11:00 AM FOR 60 MINUTES [place] [facility]   REMOVAL OF BILATERAL TISSUE EXPANDERS WITH PLACEMENT OF BILATERAL BREAST IMPLANTS Bilateral 07/25/2021   Procedure: REMOVAL OF BILATERAL TISSUE EXPANDERS WITH PLACEMENT OF BILATERAL BREAST IMPLANTS;  Surgeon: [name], MD;  Location: [place];   Service: Plastics;  Laterality: Bilateral;  1.5   Patient Active Problem List   Diagnosis Date Noted   Leucocytosis 05/27/2021   Crohn's disease of small and large intestines with complication ([place])    SBO (small bowel obstruction) ([place]) 01/26/2021   History of Crohn's disease    Breast cancer ([place]) 11/09/2020   Port-A-Cath in place 07/15/2020   Genetic testing 06/25/2020   Family history of breast cancer 06/11/2020   Malignant neoplasm of upper-outer quadrant of right breast in female, estrogen receptor positive ([place]) 06/04/2020   Abnormal finding present on diagnostic imaging of uterus 05/07/2019   Vitamin D deficiency 09/24/2013   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 10/17/2010    PCP: Does not have one  REFERRING PROVIDER: Wilber Bihari, NP  REFERRING DIAG: C50.911 (ICD-10-CM) - Recurrent breast cancer, right ([place])  THERAPY DIAG:  Lymphedema, not elsewhere classified  Pain of right thumb  Abnormal posture  Carcinoma of upper-outer quadrant of right breast in female, estrogen receptor positive ([place])  ONSET DATE: 01/16/22  Rationale for Evaluation and Treatment: Rehabilitation  SUBJECTIVE:                                                                                                                                                                                           SUBJECTIVE STATEMENT: I have been wearing the brace about a week and a half. The pain is a lot better, but I still feel it. The swelling in my fingers is better too.  PERTINENT HISTORY:  Patient was diagnosed on 06/04/2020 with right grade III invasive ductal carcinoma breast cancer. She neoadjuvant chemotherapy from 06/25/2020 - 10/29/2020 followed by bilateral mastectomies with expanders placed on 11/09/2020 with 0/12 axillary nodes positive for cancer. It is ER positive, PR negative, and HER2 negative with a Ki67 of 40%. 03/28/22- diagnosed with R recurrence and will  undergo a lumpectomy PAIN:  Are you having pain? Yes NPRS scale: 9/10 when moving R thumb no pain in sitting Pain location: R thumb Pain orientation: Right  PAIN TYPE: sharp Pain description: intermittent  Aggravating factors: moving the thumb, lifting Relieving factors: not doing anything  PRECAUTIONS: Other: active cancer - recurrent R breast cancer  WEIGHT BEARING RESTRICTIONS: No  FALLS:  Has patient fallen in last 6 months? No  LIVING  ENVIRONMENT: Lives with: lives with their daughter and grandson who is 21 months old Lives in: House/apartment Stairs: No;  Has following equipment at home: None  OCCUPATION: full time - Careers information officer  LEISURE: walks, lifts weights 5lbs, squats 2x/wk  HAND DOMINANCE: right   PRIOR LEVEL OF FUNCTION: Independent  PATIENT GOALS: to get the swelling down and the pain to stop   OBJECTIVE:  COGNITION: Overall cognitive status: Within functional limits for tasks assessed   PALPATION: Tenderness along thumb extending to wrist  OBSERVATIONS / OTHER ASSESSMENTS: R hand is visibly swollen  POSTURE: forward head, rounded shoulders  UPPER EXTREMITY AROM/PROM: WFL   LYMPHEDEMA ASSESSMENTS:   SURGERY TYPE/DATE: bilateral mastectomy 11/09/20 and will need a lumpectomy for recent recurrence on R side  NUMBER OF LYMPH NODES REMOVED: 0/12  CHEMOTHERAPY: completed  RADIATION:completed 2022  LYMPHEDEMA ASSESSMENTS:   LANDMARK RIGHT  eval RIGHT 111/2024  10 cm proximal to olecranon process 25.4 26.7  Olecranon process 22.2 23.2  10 cm proximal to ulnar styloid process 18.4 19.6  Just proximal to ulnar styloid process 15.3 15.1  Across hand at thumb web space 19.2 19.5  At base of 2nd digit 6.5 6.6  (Blank rows = not tested)  LANDMARK LEFT  eval  10 cm proximal to olecranon process 25.5  Olecranon process 23  10 cm proximal to ulnar styloid process 18.4  Just proximal to ulnar styloid process 14.5  Across hand at thumb web space  17.9  At base of 2nd digit 5.7  (Blank rows = not tested)    L-DEX LYMPHEDEMA SCREENING:  The patient was assessed using the L-Dex machine today to produce a lymphedema index baseline score. The patient will be reassessed on a regular basis (typically every 3 months) to obtain new L-Dex scores. If the score is > 6.5 points away from his/her baseline score indicating onset of subclinical lymphedema, it will be recommended to wear a compression garment for 4 weeks, 12 hours per day and then be reassessed. If the score continues to be > 6.5 points from baseline at reassessment, we will initiate lymphedema treatment. Assessing in this manner has a 95% rate of preventing clinically significant lymphedema.     QUICK DASH SURVEY:         LLIS: 23.53  TODAY'S TREATMENT:                                                                                                                                         DATE:  04/20/2022 Pt shown how to remove brace and range her wrist so it doesn't get tight, and advised not to wear at night to see if finger/hand swelling will improve and not to get wrist area on brace too tight. Measured arm for therapy note and for sleeve and glove fit. She measured into a Size 1 Medi Harmony sleeve and size 2  Harmony glove.  I had her try on the harmony sleeve here and it fit well. We discussed insurance vs paying outright. She will be a full pay for sleeve right now since she hasn't met her out of pocket. Cost with compression guru is nearly $200.00. She would like to wait until after her surgery to order her sleeve, and would like to try wrapping her hand while she wears the sleeve she has. Advised we could try that next visit. Had pt sit in a chair so she could observe me and instructed in self MLD to the right UE with VC's for correct pressure and sequence, Short neck, 5 diaphragmatic breaths, activate left axillary and right inguinal LN's, Anterior interaxillary pathway,  right axillo-inguinal pathway, Right outer arm, inner to outer arm, outer arm then retracing steps then forearm retracing steps and hand retracing steps and ending with LN's. Pt was given a handout for home use      04/07/22: Educated pt to purchase a brace for her wrist and thumb to immobilize her thumb to allow it to heal. Issued 3 exercises to help decrease pain along tendons of R thumb. Educated her to wear the brace during daytime hours for about a week and to avoid twisting motions and lifting. Also educated pt that she may need a custom compression sleeve but will assess for this at next visit.   PATIENT EDUCATION:  Education details: tenosynovitis and causes, need for wrist splint, exercises to help with thumb pain, lymphedema vs subclinical lymphedema Person educated: Patient Education method: Explanation, Demonstration, and Handouts Education comprehension: verbalized understanding and returned demonstration  HOME EXERCISE PROGRAM: , Thumb lifts, Place your hand on a flat surface, with your palm up. Lift your thumb away from your palm to make a "C" shape. Hold for about 6 seconds. Repeat 8 to 12 times. Passive thumb MP flexion, Hold your hand in front of you, and turn your hand so your little finger faces down and your thumb faces up. (Your hand should be in the position used for shaking someone's hand.) You may also rest your hand on a flat surface. Use the fingers on your other hand to bend your thumb down at the point where your thumb connects to your palm. Hold for at least 15 to 30 seconds. Repeat 2 to 4 times. Finkelstein stretch, Hold your arms out in front of you. (Your hand should be in the position used for shaking someone's hand.) Bend your thumb toward your palm. Use your other hand to gently stretch your thumb and wrist downward until you feel a stretch on the thumb side of your wrist. Hold for at least 15 to 30 seconds. Repeat 2 to 4 times. It's a good idea to  repeat these steps with your other wrist.  ASSESSMENT:  CLINICAL IMPRESSION: Pts arm and hand remeasured with some increases noted in arm and dorsum of hand and fingers still swollen but improved per pt. report. Measured for sleeve and glove but pt wants to try and wait until after her surgery to order so she doesn't have to pay upfront. (Charges would have to be in for her deductible to show being met). Instructed pt in Self MLD to the right UE. She did well but will require review . She required visual, verbal and occasional tactile cues but did well overall, and importance of performing was emphasized.     OBJECTIVE IMPAIRMENTS: decreased knowledge of condition, decreased knowledge of use of DME, increased edema, increased fascial restrictions, impaired UE  functional use, and pain.   ACTIVITY LIMITATIONS: carrying, lifting, and caring for others  PARTICIPATION LIMITATIONS: meal prep, cleaning, and occupation  PERSONAL FACTORS: Time since onset of injury/illness/exacerbation are also affecting patient's functional outcome.   REHAB POTENTIAL: Good  CLINICAL DECISION MAKING: Stable/uncomplicated  EVALUATION COMPLEXITY: Low  GOALS: Goals reviewed with patient? Yes  LONG TERM GOALS=SHORT TERM GOALS Target date: 05/05/22  Pt will report no pain in thumb and wrist when moving her thumb.  Baseline:  Goal status: INITIAL  2.  Pt will obtain an appropriate compression sleeve and glove for long term management of lymphedema.  Baseline:  Goal status: INITIAL  4.  Pt will be independent in self MLD for long term management of lymphedema  Baseline:  Goal status: INITIAL  5.  Pt will be independent in a home exercise program to decrease thumb and wrist pain on R side.  Baseline:  Goal status: INITIAL  6.  Pt will demonstrate a 0.5 decrease in edema at dorsum of hand to allow improved comfort.  Baseline:  Goal status: INITIAL  PLAN:  PT FREQUENCY: 1x/week  PT DURATION: 4  weeks  PLANNED INTERVENTIONS: Therapeutic exercises, Therapeutic activity, Patient/Family education, Self Care, Joint mobilization, Orthotic/Fit training, DME instructions, Manual lymph drainage, Compression bandaging, scar mobilization, Taping, Manual therapy, and Re-evaluation  PLAN FOR NEXT SESSION: how were tenosynovitis exercises? Did she get the brace for her wrist/thumb? Measure for a compression sleeve and glove - does she fit in to off the shelf or does she need a custom (demo sent to [facility] on 04/07/22) teach MLD to [place], PT 04/20/2022, 5:04 PM [place] @ [address], [place], [address] Phone: [phone]   Fax:  [phone]  Patient Details  Name: STARIA BIRKHEAD MRN: [id number] Date of Birth: [birth year] Referring Provider:  Wilber Bihari [place]*  Encounter Date: 04/20/2022   Claris Pong, PT 04/20/2022, 5:04 PM  [place] @ [address] [place], [place], [address] Phone: [phone]   Fax:  [phone]

## 2022-04-21 ENCOUNTER — Encounter: Payer: Self-pay | Admitting: Hematology and Oncology

## 2022-04-24 ENCOUNTER — Inpatient Hospital Stay (HOSPITAL_BASED_OUTPATIENT_CLINIC_OR_DEPARTMENT_OTHER): Payer: Managed Care, Other (non HMO) | Admitting: Hematology and Oncology

## 2022-04-24 ENCOUNTER — Encounter (HOSPITAL_BASED_OUTPATIENT_CLINIC_OR_DEPARTMENT_OTHER): Payer: Self-pay | Admitting: General Surgery

## 2022-04-24 ENCOUNTER — Other Ambulatory Visit: Payer: Self-pay

## 2022-04-24 DIAGNOSIS — C50411 Malignant neoplasm of upper-outer quadrant of right female breast: Secondary | ICD-10-CM

## 2022-04-24 DIAGNOSIS — Z170 Estrogen receptor positive status [ER+]: Secondary | ICD-10-CM

## 2022-04-24 NOTE — Progress Notes (Signed)
HEMATOLOGY-ONCOLOGY TELEPHONE VISIT PROGRESS NOTE  I connected with our patient on 04/24/22 at  3:45 PM EST by telephone and verified that I am speaking with the correct person using two identifiers.  I discussed the limitations, risks, security and privacy concerns of performing an evaluation and management service by telephone and the availability of in person appointments.  I also discussed with the patient that there may be a patient responsible charge related to this service. The patient expressed understanding and agreed to proceed.   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 palpable right breast mass measuring about 7 cm x 5 and half centimeters, palpable right axillary lymph node with concern for skin invasion    06/08/2020 Cancer Staging   Staging form: Breast, AJCC 8th Edition - Clinical: Stage IIIB (cT4d, cN1, cM0, G3, ER+, PR-, HER2+) - Signed by [name], MD on 07/07/2020 Histologic grading system: 3 grade system   06/24/2020 Genetic Testing   No pathogenic variants detected in [facility].  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  The report date is June 24, 2020.   The [facility] panel offered by [facility] includes sequencing and rearrangement analysis for the following 47 genes:  APC, ATM, AXIN2, BARD1, BMPR1A, BRCA1, BRCA2, BRIP1, CDH1, CDK4, CDKN2A, CHEK2, DICER1, EPCAM, GREM1, HOXB13, MEN1, MLH1, MSH2, MSH3, MSH6, MUTYH, NBN, NF1, NF2, NTHL1,  PALB2, PMS2, POLD1, POLE, PTEN, RAD51C, RAD51D, RECQL, RET, SDHA, SDHAF2, SDHB, SDHC, SDHD, SMAD4, SMARCA4, STK11, TP53, TSC1, TSC2, and VHL.  RNA data is routinely analyzed for use in variant interpretation for all genes.   06/25/2020 - 10/29/2020 Chemotherapy   TCHP x6 cycles   11/09/2020 Surgery   Bilateral mastectomies: Left mastectomy: Benign Right mastectomy: Residual microinvasive cancer status post neoadjuvant therapy, DCIS, 0/12 lymph nodes, ER +1%, PR negative, HER2 equivocal 2+ by [facility]   11/19/2020 - 07/15/2021 Chemotherapy   Patient is on Treatment Plan : BREAST Trastuzumab  + Pertuzumab q21d x 13 cycles     01/11/2021 - 03/09/2021 Radiation Therapy   Site Technique Total Dose (Gy) Dose per Fx (Gy) Completed Fx Beam Energies  Chest Wall, Right: CW_Rt 3D 50.4/50.4 1.8 28/28 10X  Chest Wall, Right: CW_Rt_SCLV 3D 50.4/50.4 1.8 28/28 6X, 10X  Chest Wall, Right: CW_Rt_Bst Electron 10/10 2 5/5 6E     06/08/2021 - 06/24/2021 Anti-estrogen oral therapy   Tamoxifen (discontinued)     REVIEW OF SYSTEMS:   Constitutional: Denies fevers, chills or abnormal weight loss All other systems were reviewed with the patient and are negative. 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 palpable right breast mass measuring about 7 cm x 5 and half centimeters, palpable right axillary lymph node with concern for  skin invasion    Treatment Plan: 1. Neoadjuvant chemo with TCHP q 3 weeks X 6 followed by HP completed 07/07/2021 2. Bilateral mastectomies: Left mastectomy: Benign Right mastectomy: Residual microinvasive cancer status post neoadjuvant therapy, DCIS, 0/12 lymph  nodes, ER +1%, PR negative, HER2 equivocal 2+ by IHC 3. Adj XRT completed 02/23/2021 4. Adj Anti-estrogen therapy with tamoxifen started 06/08/2021 discontinued 06/24/2021 (ER was only 1% therefore we felt risks outweigh benefits) 5. Neratinib: Because of her bowel issues she decided not to take it. --------------------------------------------------------------------------------------------------------- Hospitalization 05/18/2021-06/13/2021 (small bowel obstruction) Right chest palpable nodule: 1.2 cm mass at 10 o'clock position biopsy revealed grade 2 IDC with high-grade DCIS ER 20% positive weak staining, PR 0%, HER2 3+ positive, Ki-67 50%   Recommendation: Staging scans: CT CAP 04/06/2022: No evidence of metastatic disease.  Surgical changes in the colon as well as inflammation from Crohn's enteritis. Bone scan 04/12/2022: Right fifth rib uptake.  I discussed with radiology it was present in 2022.  However there is no CT correlate for this finding and hence a biopsy is not possible.  We will watch and monitor this. Excision of the nodule Adjuvant Kadcyla   Discussion: Patient would like to know if there are any alternatives to Kadcyla.  I discussed with the patient that there have not been any head-to-head trials comparing Kadcyla to neratinib but if she were to decide not to take Kadcyla the neratinib is certainly an option.  I briefly discussed the side effects of neratinib including the rare side effect of diarrhea and she appears to understand this and will think about it and will inform us tomorrow about her decision.  If she does not want to Kadcyla then she does not need a port.   I discussed the assessment and treatment plan with the patient. The patient was provided an opportunity to ask questions and all were answered. The patient agreed with the plan and demonstrated an understanding of the instructions. The patient was advised to call back or seek an in-person evaluation if the symptoms  worsen or if the condition fails to improve as anticipated.   I provided 12 minutes of non-face-to-face time during this encounter.  This includes time for charting and coordination of care   Harriette Ohara, MD

## 2022-04-24 NOTE — Assessment & Plan Note (Signed)
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 palpable right breast mass measuring about 7 cm x 5 and half centimeters, palpable right axillary lymph node with concern for skin invasion    Treatment Plan: 1. Neoadjuvant chemo with TCHP q 3 weeks X 6 followed by HP completed 07/07/2021 2. Bilateral mastectomies: Left mastectomy: Benign Right mastectomy: Residual microinvasive cancer status post neoadjuvant therapy, DCIS, 0/12 lymph nodes, ER +1%, PR negative, HER2 equivocal 2+ by IHC 3. Adj XRT completed 02/23/2021 4. Adj Anti-estrogen therapy with tamoxifen started 06/08/2021 discontinued 06/24/2021 (ER was only 1% therefore we felt risks outweigh benefits) 5. Neratinib: Because of her bowel issues she decided not to take it. --------------------------------------------------------------------------------------------------------- Hospitalization 05/18/2021-06/13/2021 (small bowel obstruction) Right chest palpable nodule: 1.2 cm mass at 10 o'clock position biopsy revealed grade 2 IDC with high-grade DCIS ER 20% positive weak staining, PR 0%, HER2 3+ positive, Ki-67 50%   Recommendation: Staging scans: CT CAP 04/06/2022: No evidence of metastatic disease.  Surgical changes in the colon as well as inflammation from Crohn's enteritis. Bone scan 04/12/2022: Right fifth rib uptake.  I discussed with radiology it was present in 2022.  However there is no CT correlate for this finding and hence a biopsy is not possible.  We will watch and monitor this. Excision of the nodule Adjuvant Kadcyla   Discussion: Patient would like to know if there are any alternatives to Kadcyla.  I discussed with the patient that there have not been any head-to-head trials comparing Kadcyla to neratinib but if she were to decide not to take Kadcyla the  neratinib is certainly an option.  I briefly discussed the side effects of neratinib including the rare side effect of diarrhea and she appears to understand this and will think about it and will inform us tomorrow about her decision.  If she does not want to Kadcyla then she does not need a port.

## 2022-04-25 ENCOUNTER — Encounter: Payer: Self-pay | Admitting: Internal Medicine

## 2022-04-26 ENCOUNTER — Ambulatory Visit: Payer: Managed Care, Other (non HMO)

## 2022-04-26 ENCOUNTER — Encounter: Payer: Self-pay | Admitting: Hematology and Oncology

## 2022-04-26 ENCOUNTER — Other Ambulatory Visit: Payer: Self-pay | Admitting: Hematology and Oncology

## 2022-04-26 ENCOUNTER — Encounter: Payer: Self-pay | Admitting: Internal Medicine

## 2022-04-26 ENCOUNTER — Telehealth: Payer: Self-pay | Admitting: *Deleted

## 2022-04-26 DIAGNOSIS — I890 Lymphedema, not elsewhere classified: Secondary | ICD-10-CM

## 2022-04-26 DIAGNOSIS — C50411 Malignant neoplasm of upper-outer quadrant of right female breast: Secondary | ICD-10-CM

## 2022-04-26 DIAGNOSIS — M79644 Pain in right finger(s): Secondary | ICD-10-CM

## 2022-04-26 DIAGNOSIS — R293 Abnormal posture: Secondary | ICD-10-CM

## 2022-04-26 MED ORDER — ONDANSETRON HCL 8 MG PO TABS: 8.0000 mg | ORAL_TABLET | Freq: Three times a day (TID) | ORAL | 1 refills | Status: DC | PRN

## 2022-04-26 MED ORDER — LIDOCAINE-PRILOCAINE 2.5-2.5 % EX CREA: TOPICAL_CREAM | CUTANEOUS | 3 refills | Status: DC

## 2022-04-26 MED ORDER — PROCHLORPERAZINE MALEATE 10 MG PO TABS: 10.0000 mg | ORAL_TABLET | Freq: Four times a day (QID) | ORAL | 1 refills | Status: DC | PRN

## 2022-04-26 NOTE — Patient Instructions (Signed)
Self bandaging the arm:   Follow along with this video:  [employer]  -OR-  Search in your internet browser: "self bandaging arm MD Ouida Sills"   And the video should appear in the video search results "Lymphedema Management: Self bandaging your arm" on YouTube   This video is for caregiver assistance:  [employer]com  -OR-  Enter " Lymphedema Management: Caregiver Bandaging you arm" into your search browser and your video should appear in the results

## 2022-04-26 NOTE — Telephone Encounter (Signed)
Received call from pt with some concerns with starting Kadcyla.  Pt states with her previous tx's she experienced bowel obstructions and hospitalizations due to the chemotherapy and is wanting to discuss strength of Kadcyla with MD before proceeding.  RN will alert MD to contact pt to further discuss.

## 2022-04-26 NOTE — Therapy (Signed)
OUTPATIENT PHYSICAL THERAPY ONCOLOGY TREATMENT  Patient Name: [name] MRN: [id number] DOB:30-Dec-1975, 47 y.o., female Today's Date: 04/26/2022  END OF SESSION:  PT End of Session - 04/26/22 1559     Visit Number 3    Number of Visits 5    Date for PT Re-Evaluation 05/05/22    PT Start Time 1603    PT Stop Time 1645    PT Time Calculation (min) 42 min    Activity Tolerance Patient tolerated treatment well    Behavior During Therapy [facility] for tasks assessed/performed             Past Medical History:  Diagnosis Date   Cancer ([place])    Colon stricture ([place]) 05/07/2019   Crohn's colitis, with intestinal obstruction ([place]) 09/17/2013   Dx 2006 - right and left colon involved 2011 - pan-colitis 09/17/2013 left colitis 60-20 cm March 2021 subtotal colectomy ileosigmoid anastomosis for colonic strictures-Dr. Marcello Moores     Crohn's disease [facility])    Family history of breast cancer 06/11/2020   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 10/17/2010   Rectal bleeding    Uterine fibroid    Vitamin D deficiency 09/24/2013   Past Surgical History:  Procedure Laterality Date   AXILLARY LYMPH NODE DISSECTION Right 11/09/2020   Procedure: RIGHT AXILLARY LYMPH NODE DISSECTION;  Surgeon: [name], MD;  Location: [place];  Service: General;  Laterality: Right;   BIOPSY  07/01/2020   Procedure: BIOPSY;  Surgeon: Yetta Flock, MD;  [facility];  Service: Gastroenterology;;   BIOPSY  01/27/2021   Procedure: BIOPSY;  Surgeon: Yetta Flock, MD;  [facility];  Service: Gastroenterology;;   BIOPSY  09/07/2021   Procedure: BIOPSY;  Surgeon: Jackquline Denmark, MD;  [facility];  Service: Gastroenterology;;   BREAST BIOPSY Right 03/24/2022   [place] RT BREAST BX W LOC DEV 1ST LESION IMG BX SPEC US GUIDE 03/24/2022 [facility]AM[facility]GR[facility]   BREAST RECONSTRUCTION WITH PLACEMENT OF TISSUE EXPANDER AND FLEX HD  (ACELLULAR HYDRATED DERMIS) Bilateral 11/09/2020   Procedure: BILATERAL BREAST RECONSTRUCTION WITH PLACEMENT OF TISSUE EXPANDER AND FLEX HD (ACELLULAR HYDRATED DERMIS);  Surgeon: [name], MD;  Location: [place];  Service: Plastics;  Laterality: Bilateral;   CAPSULOTOMY Bilateral 07/25/2021   Procedure: CAPSULOTOMY;  Surgeon: [name], MD;  Location: [place];  Service: Plastics;  Laterality: Bilateral;   COLECTOMY  06/27/2019   COLONOSCOPY  2015   FLEXIBLE SIGMOIDOSCOPY N/A 07/01/2020   Procedure: FLEXIBLE SIGMOIDOSCOPY;  Surgeon: Yetta Flock, MD;  [facility];  Service: Gastroenterology;  Laterality: N/A;   FLEXIBLE SIGMOIDOSCOPY N/A 01/27/2021   Procedure: FLEXIBLE SIGMOIDOSCOPY;  Surgeon: Yetta Flock, MD;  [facility];  Service: Gastroenterology;  Laterality: N/A;   FLEXIBLE SIGMOIDOSCOPY N/A 09/07/2021   Procedure: FLEXIBLE SIGMOIDOSCOPY;  Surgeon: Jackquline Denmark, MD;  [facility];  Service: Gastroenterology;  Laterality: N/A;   NIPPLE SPARING MASTECTOMY Bilateral 11/09/2020   Procedure: BILATERAL NIPPLE SPARING MASTECTOMY;  Surgeon: [name], MD;  Location: [place];  Service: General;  Laterality: Bilateral;   PORT-A-CATH REMOVAL N/A 07/25/2021   Procedure: REMOVAL PORT-A-CATH;  Surgeon: [name], MD;  Location: [place];  Service: Plastics;  Laterality: N/A;   PORTACATH PLACEMENT N/A 06/23/2020   Procedure: INSERTION PORT-A-CATH;  Surgeon: [name], MD;  Location: [place];  Service: General;  Laterality: N/A;  START  TIME OF 11:00 AM FOR 60 MINUTES [place] [facility]   REMOVAL OF BILATERAL TISSUE EXPANDERS WITH PLACEMENT OF BILATERAL BREAST IMPLANTS Bilateral 07/25/2021   Procedure: REMOVAL OF BILATERAL TISSUE EXPANDERS WITH PLACEMENT OF BILATERAL BREAST IMPLANTS;  Surgeon: [name], MD;  Location: [place];   Service: Plastics;  Laterality: Bilateral;  1.5   Patient Active Problem List   Diagnosis Date Noted   Leucocytosis 05/27/2021   Crohn's disease of small and large intestines with complication ([place])    SBO (small bowel obstruction) ([place]) 01/26/2021   History of Crohn's disease    Breast cancer ([place]) 11/09/2020   Port-A-Cath in place 07/15/2020   Genetic testing 06/25/2020   Family history of breast cancer 06/11/2020   Malignant neoplasm of upper-outer quadrant of right breast in female, estrogen receptor positive ([place]) 06/04/2020   Abnormal finding present on diagnostic imaging of uterus 05/07/2019   Vitamin D deficiency 09/24/2013   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 10/17/2010    PCP: Does not have one  REFERRING PROVIDER: Wilber Bihari, NP  REFERRING DIAG: C50.911 (ICD-10-CM) - Recurrent breast cancer, right ([place])  THERAPY DIAG:  Lymphedema, not elsewhere classified  Pain of right thumb  Abnormal posture  Carcinoma of upper-outer quadrant of right breast in female, estrogen receptor positive ([place])  ONSET DATE: 01/16/22  Rationale for Evaluation and Treatment: Rehabilitation  SUBJECTIVE:                                                                                                                                                                                           SUBJECTIVE STATEMENT: I have tried the MLD and it seemed to do well. I wear the brace in the day and remove at night. My finger swelling has been better, but hand is still swollen  PERTINENT HISTORY:  Patient was diagnosed on 06/04/2020 with right grade III invasive ductal carcinoma breast cancer. She neoadjuvant chemotherapy from 06/25/2020 - 10/29/2020 followed by bilateral mastectomies with expanders placed on 11/09/2020 with 0/12 axillary nodes positive for cancer. It is ER positive, PR negative, and HER2 negative with a Ki67 of 40%. 03/28/22- diagnosed with R  recurrence and will undergo a lumpectomy PAIN:  Are you having pain? Yes NPRS scale: 2/10 when moving R thumb no pain in sitting Pain location: R thumb Pain orientation: Right  PAIN TYPE: sharp Pain description: intermittent  Aggravating factors: moving the thumb, lifting Relieving factors: not doing anything  PRECAUTIONS: Other: active cancer - recurrent R breast cancer  WEIGHT BEARING RESTRICTIONS: No  FALLS:  Has patient fallen in last 6 months? No  LIVING ENVIRONMENT: Lives with: lives with their daughter and grandson who is 59 months old Lives in: House/apartment Stairs: No;  Has following equipment at home: None  OCCUPATION: full time - Careers information officer  LEISURE: walks, lifts weights 5lbs, squats 2x/wk  HAND DOMINANCE: right   PRIOR LEVEL OF FUNCTION: Independent  PATIENT GOALS: to get the swelling down and the pain to stop   OBJECTIVE:  COGNITION: Overall cognitive status: Within functional limits for tasks assessed   PALPATION: Tenderness along thumb extending to wrist  OBSERVATIONS / OTHER ASSESSMENTS: R hand is visibly swollen  POSTURE: forward head, rounded shoulders  UPPER EXTREMITY AROM/PROM: WFL   LYMPHEDEMA ASSESSMENTS:   SURGERY TYPE/DATE: bilateral mastectomy 11/09/20 and will need a lumpectomy for recent recurrence on R side  NUMBER OF LYMPH NODES REMOVED: 0/12  CHEMOTHERAPY: completed  RADIATION:completed 2022  LYMPHEDEMA ASSESSMENTS:   LANDMARK RIGHT  eval RIGHT 04/20/2022  10 cm proximal to olecranon process 25.4 26.7  Olecranon process 22.2 23.2  10 cm proximal to ulnar styloid process 18.4 19.6  Just proximal to ulnar styloid process 15.3 15.1  Across hand at thumb web space 19.2 19.5  At base of 2nd digit 6.5 6.6  (Blank rows = not tested)  LANDMARK LEFT  eval  10 cm proximal to olecranon process 25.5  Olecranon process 23  10 cm proximal to ulnar styloid process 18.4  Just proximal to ulnar styloid process 14.5  Across  hand at thumb web space 17.9  At base of 2nd digit 5.7  (Blank rows = not tested)    L-DEX LYMPHEDEMA SCREENING:  The patient was assessed using the L-Dex machine today to produce a lymphedema index baseline score. The patient will be reassessed on a regular basis (typically every 3 months) to obtain new L-Dex scores. If the score is > 6.5 points away from his/her baseline score indicating onset of subclinical lymphedema, it will be recommended to wear a compression garment for 4 weeks, 12 hours per day and then be reassessed. If the score continues to be > 6.5 points from baseline at reassessment, we will initiate lymphedema treatment. Assessing in this manner has a 95% rate of preventing clinically significant lymphedema.     QUICK DASH SURVEY:         LLIS: 23.53  TODAY'S TREATMENT:                                                                                                                                         DATE:  04/26/2022 Reviewed self MLD with pt in sitting to the right UE with VC's for correct pressure and sequence, Short neck, 5 diaphragmatic breaths, activate left axillary and right inguinal LN's, Anterior interaxillary pathway, right axillo-inguinal pathway, Right outer arm, inner to outer arm, outer arm then retracing steps then forearm retracing steps and hand retracing steps and ending with LN's. Pt required occasional VC's  and TC's but did very well overall. Discussed HSA and if pt had enough that she could use to cover her sleeve and glove.  She had enough money so she was given info to order [employer] and glove in 30-40 compression. Pt applied her sleeve and therapist started instructing pt in finger wrap and PT performed once and then pt performed. She struggled initially with keeping wrap straight but improved as she did more. She used the counter for assist. She had to leave at 4:45 to get her grandchild from daycare so I applied hand wrap with  instruction to pt. I gave her video instructions for bandaging. Showed her how to check capillary refill and instructed to remove if wrap is ever painful or causes numbness.  She knows she must remove sleeve and wrap before going to bed and reapply in am.Pt verbalized understanding.   04/20/2022 Pt shown how to remove brace and range her wrist so it doesn't get tight, and advised not to wear at night to see if finger/hand swelling will improve and not to get wrist area on brace too tight. Measured arm for therapy note and for sleeve and glove fit. She measured into a Size 1 Medi Harmony sleeve and size 2  Harmony glove. I had her try on the harmony sleeve here and it fit well. We discussed insurance vs paying outright. She will be a full pay for sleeve right now since she hasn't met her out of pocket. Cost with compression guru is nearly $200.00. She would like to wait until after her surgery to order her sleeve, and would like to try wrapping her hand while she wears the sleeve she has. Advised we could try that next visit. Had pt sit in a chair so she could observe me and instructed in self MLD to the right UE with VC's for correct pressure and sequence, Short neck, 5 diaphragmatic breaths, activate left axillary and right inguinal LN's, Anterior interaxillary pathway, right axillo-inguinal pathway, Right outer arm, inner to outer arm, outer arm then retracing steps then forearm retracing steps and hand retracing steps and ending with LN's. Pt was given a handout for home use      04/07/22: Educated pt to purchase a brace for her wrist and thumb to immobilize her thumb to allow it to heal. Issued 3 exercises to help decrease pain along tendons of R thumb. Educated her to wear the brace during daytime hours for about a week and to avoid twisting motions and lifting. Also educated pt that she may need a custom compression sleeve but will assess for this at next visit.   PATIENT EDUCATION:  Education  details: tenosynovitis and causes, need for wrist splint, exercises to help with thumb pain, lymphedema vs subclinical lymphedema Person educated: Patient Education method: Explanation, Demonstration, and Handouts Education comprehension: verbalized understanding and returned demonstration  HOME EXERCISE PROGRAM: , Thumb lifts, Place your hand on a flat surface, with your palm up. Lift your thumb away from your palm to make a "C" shape. Hold for about 6 seconds. Repeat 8 to 12 times. Passive thumb MP flexion, Hold your hand in front of you, and turn your hand so your little finger faces down and your thumb faces up. (Your hand should be in the position used for shaking someone's hand.) You may also rest your hand on a flat surface. Use the fingers on your other hand to bend your thumb down at the point where your thumb connects to  your palm. Hold for at least 15 to 30 seconds. Repeat 2 to 4 times. Finkelstein stretch, Hold your arms out in front of you. (Your hand should be in the position used for shaking someone's hand.) Bend your thumb toward your palm. Use your other hand to gently stretch your thumb and wrist downward until you feel a stretch on the thumb side of your wrist. Hold for at least 15 to 30 seconds. Repeat 2 to 4 times. It's a good idea to repeat these steps with your other wrist.  ASSESSMENT:  CLINICAL IMPRESSION: Pt did very well with self MLD to the left UE with occasional VC's to slow down and use less pressure and occasionally for sequence. She was improving with finger wrap but had to leave early to pick up her grandchild from day care. She was shown how to use the table for assist, but did not get to practice the hand wrap. She was given the website for bandaging.She is going to ofer the sleeve and glove on [employer] or [place] if they carry [employer].   OBJECTIVE IMPAIRMENTS: decreased knowledge of condition, decreased knowledge of use of DME, increased  edema, increased fascial restrictions, impaired UE functional use, and pain.   ACTIVITY LIMITATIONS: carrying, lifting, and caring for others  PARTICIPATION LIMITATIONS: meal prep, cleaning, and occupation  PERSONAL FACTORS: Time since onset of injury/illness/exacerbation are also affecting patient's functional outcome.   REHAB POTENTIAL: Good  CLINICAL DECISION MAKING: Stable/uncomplicated  EVALUATION COMPLEXITY: Low  GOALS: Goals reviewed with patient? Yes  LONG TERM GOALS=SHORT TERM GOALS Target date: 05/05/22  Pt will report no pain in thumb and wrist when moving her thumb.  Baseline:  Goal status: INITIAL  2.  Pt will obtain an appropriate compression sleeve and glove for long term management of lymphedema.  Baseline:  Goal status: INITIAL  4.  Pt will be independent in self MLD for long term management of lymphedema  Baseline:  Goal status: INITIAL  5.  Pt will be independent in a home exercise program to decrease thumb and wrist pain on R side.  Baseline:  Goal status: INITIAL  6.  Pt will demonstrate a 0.5 decrease in edema at dorsum of hand to allow improved comfort.  Baseline:  Goal status: INITIAL  PLAN:  PT FREQUENCY: 1x/week  PT DURATION: 4 weeks  PLANNED INTERVENTIONS: Therapeutic exercises, Therapeutic activity, Patient/Family education, Self Care, Joint mobilization, Orthotic/Fit training, DME instructions, Manual lymph drainage, Compression bandaging, scar mobilization, Taping, Manual therapy, and Re-evaluation  PLAN FOR NEXT SESSION:  Did pt order sleeve and glove on compression guru?, review MLD to RUE prn, review bandaging prn   Claris Pong, PT 04/26/2022, 4:53 PM [place] @ [address] [place], [place], [address] Phone: [phone]   Fax:  [phone]  Patient Details  Name: DREMA EDDINGTON MRN: [id number] Date of Birth: 08/07/1975 Referring Provider:  Wilber Bihari  [place]*  Encounter Date: 04/26/2022   Claris Pong, PT 04/26/2022, 4:53 PM  [place] @ [address] [place], [place], [address] Phone: [phone]   Fax:  [phone]

## 2022-04-26 NOTE — Progress Notes (Signed)
DISCONTINUE ON PATHWAY REGIMEN - Breast     A cycle is every 21 days:     Pertuzumab      Pertuzumab      Trastuzumab-xxxx      Trastuzumab-xxxx      Carboplatin      Docetaxel   **Always confirm dose/schedule in your pharmacy ordering system**  REASON: Disease Progression PRIOR TREATMENT: [id number]: Docetaxel + Carboplatin + Trastuzumab IV + Pertuzumab IV (TCHP IV) q21 Days x 6 Cycles TREATMENT RESPONSE: Unable to Evaluate  START OFF PATHWAY REGIMEN - Breast   [id number]:Ado-Trastuzumab Emtansine 3.6 mg/kg IV D1 q21 Days:   A cycle is every 21 days:     Ado-trastuzumab emtansine   **Always confirm dose/schedule in your pharmacy ordering system**  Patient Characteristics: Postoperative without Neoadjuvant Therapy (Pathologic Staging), Invasive Disease, Adjuvant Therapy, HER2 Positive, ER Positive, Node Negative, pT1c, pN0/N93m Therapeutic Status: Postoperative without Neoadjuvant Therapy (Pathologic Staging) AJCC Grade: G2 AJCC N Category: pN0 AJCC M Category: cM0 ER Status: Positive (+) AJCC 8 Stage Grouping: IA HER2 Status: Positive (+) Oncotype Dx Recurrence Score: Not Appropriate AJCC T Category: pT1c PR Status: Negative (-) Intent of Therapy: Curative Intent, Discussed with Patient

## 2022-04-28 ENCOUNTER — Other Ambulatory Visit: Payer: Self-pay | Admitting: General Surgery

## 2022-04-28 ENCOUNTER — Ambulatory Visit
Admission: RE | Admit: 2022-04-28 | Discharge: 2022-04-28 | Disposition: A | Discharge status: Home / Self Care | Payer: Managed Care, Other (non HMO) | Source: Ambulatory Visit | Attending: General Surgery | Admitting: General Surgery

## 2022-04-28 ENCOUNTER — Encounter: Payer: Self-pay | Admitting: *Deleted

## 2022-04-28 ENCOUNTER — Other Ambulatory Visit: Payer: Self-pay

## 2022-04-28 DIAGNOSIS — C50411 Malignant neoplasm of upper-outer quadrant of right female breast: Secondary | ICD-10-CM

## 2022-04-28 HISTORY — PX: BREAST BIOPSY: SHX20

## 2022-04-28 MED ORDER — ENSURE PRE-SURGERY PO LIQD: 296.0000 mL | Freq: Once | ORAL | Status: DC

## 2022-04-28 NOTE — Progress Notes (Signed)

## 2022-05-01 ENCOUNTER — Telehealth: Payer: Self-pay | Admitting: Hematology and Oncology

## 2022-05-01 NOTE — Telephone Encounter (Signed)
Scheduled appointments per WQ. Left voicemail.  

## 2022-05-02 ENCOUNTER — Ambulatory Visit (HOSPITAL_BASED_OUTPATIENT_CLINIC_OR_DEPARTMENT_OTHER): Payer: Managed Care, Other (non HMO) | Admitting: Anesthesiology

## 2022-05-02 ENCOUNTER — Ambulatory Visit (HOSPITAL_COMMUNITY): Payer: Managed Care, Other (non HMO)

## 2022-05-02 ENCOUNTER — Ambulatory Visit (HOSPITAL_BASED_OUTPATIENT_CLINIC_OR_DEPARTMENT_OTHER)
Admission: RE | Admit: 2022-05-02 | Discharge: 2022-05-02 | Disposition: A | Discharge status: Home / Self Care | Payer: Managed Care, Other (non HMO) | Attending: General Surgery | Admitting: General Surgery

## 2022-05-02 ENCOUNTER — Encounter (HOSPITAL_BASED_OUTPATIENT_CLINIC_OR_DEPARTMENT_OTHER): Payer: Self-pay | Admitting: General Surgery

## 2022-05-02 ENCOUNTER — Encounter (HOSPITAL_BASED_OUTPATIENT_CLINIC_OR_DEPARTMENT_OTHER)
Admission: RE | Disposition: A | Discharge status: Home / Self Care | Payer: Self-pay | Source: Home / Self Care | Attending: General Surgery

## 2022-05-02 ENCOUNTER — Ambulatory Visit
Admission: RE | Admit: 2022-05-02 | Discharge: 2022-05-02 | Disposition: A | Discharge status: Home / Self Care | Payer: Managed Care, Other (non HMO) | Source: Ambulatory Visit | Attending: General Surgery | Admitting: General Surgery

## 2022-05-02 ENCOUNTER — Other Ambulatory Visit: Payer: Self-pay

## 2022-05-02 DIAGNOSIS — C50911 Malignant neoplasm of unspecified site of right female breast: Secondary | ICD-10-CM

## 2022-05-02 DIAGNOSIS — C50411 Malignant neoplasm of upper-outer quadrant of right female breast: Secondary | ICD-10-CM | POA: Insufficient documentation

## 2022-05-02 DIAGNOSIS — K5090 Crohn's disease, unspecified, without complications: Secondary | ICD-10-CM | POA: Insufficient documentation

## 2022-05-02 DIAGNOSIS — Z170 Estrogen receptor positive status [ER+]: Secondary | ICD-10-CM | POA: Insufficient documentation

## 2022-05-02 DIAGNOSIS — Z01818 Encounter for other preprocedural examination: Secondary | ICD-10-CM

## 2022-05-02 HISTORY — PX: PORTACATH PLACEMENT: SHX2246

## 2022-05-02 HISTORY — PX: RADIOACTIVE SEED GUIDED EXCISIONAL BREAST BIOPSY: SHX6490

## 2022-05-02 LAB — NO BLOOD PRODUCTS

## 2022-05-02 LAB — POCT PREGNANCY, URINE: Preg Test, Ur: NEGATIVE

## 2022-05-02 SURGERY — INSERTION, TUNNELED CENTRAL VENOUS DEVICE, WITH PORT
Anesthesia: General | Site: Chest | Laterality: Right

## 2022-05-02 MED ORDER — FENTANYL CITRATE (PF) 100 MCG/2ML IJ SOLN
INTRAMUSCULAR | Status: AC
  Filled 2022-05-02: qty 2

## 2022-05-02 MED ORDER — HEPARIN SOD (PORK) LOCK FLUSH 100 UNIT/ML IV SOLN
INTRAVENOUS | Status: DC | PRN
  Administered 2022-05-02: 400 [IU] via INTRAVENOUS

## 2022-05-02 MED ORDER — HEPARIN (PORCINE) IN NACL 1000-0.9 UT/500ML-% IV SOLN
INTRAVENOUS | Status: AC
  Filled 2022-05-02: qty 500

## 2022-05-02 MED ORDER — OXYCODONE HCL 5 MG PO TABS
5.0000 mg | ORAL_TABLET | Freq: Once | ORAL | Status: AC
  Administered 2022-05-02: 5 mg via ORAL

## 2022-05-02 MED ORDER — MIDAZOLAM HCL 5 MG/5ML IJ SOLN
INTRAMUSCULAR | Status: DC | PRN
  Administered 2022-05-02: 2 mg via INTRAVENOUS

## 2022-05-02 MED ORDER — OXYCODONE HCL 5 MG PO TABS
ORAL_TABLET | ORAL | Status: AC
  Filled 2022-05-02: qty 1

## 2022-05-02 MED ORDER — CEFAZOLIN SODIUM-DEXTROSE 2-4 GM/100ML-% IV SOLN
INTRAVENOUS | Status: AC
  Filled 2022-05-02: qty 100

## 2022-05-02 MED ORDER — TRAMADOL HCL 50 MG PO TABS: 50.0000 mg | ORAL_TABLET | Freq: Four times a day (QID) | ORAL | 0 refills | Status: DC | PRN

## 2022-05-02 MED ORDER — CHLORHEXIDINE GLUCONATE CLOTH 2 % EX PADS: 6.0000 | MEDICATED_PAD | Freq: Once | CUTANEOUS | Status: DC

## 2022-05-02 MED ORDER — ACETAMINOPHEN 500 MG PO TABS: 1000.0000 mg | ORAL_TABLET | ORAL | Status: DC

## 2022-05-02 MED ORDER — BUPIVACAINE HCL (PF) 0.25 % IJ SOLN
INTRAMUSCULAR | Status: AC
  Filled 2022-05-02: qty 30

## 2022-05-02 MED ORDER — LIDOCAINE HCL (CARDIAC) PF 100 MG/5ML IV SOSY
PREFILLED_SYRINGE | INTRAVENOUS | Status: DC | PRN
  Administered 2022-05-02: 40 mg via INTRAVENOUS

## 2022-05-02 MED ORDER — MIDAZOLAM HCL 2 MG/2ML IJ SOLN
INTRAMUSCULAR | Status: AC
  Filled 2022-05-02: qty 2

## 2022-05-02 MED ORDER — LACTATED RINGERS IV SOLN: INTRAVENOUS | Status: DC

## 2022-05-02 MED ORDER — BUPIVACAINE HCL (PF) 0.25 % IJ SOLN
INTRAMUSCULAR | Status: DC | PRN
  Administered 2022-05-02: 8 mL

## 2022-05-02 MED ORDER — PHENYLEPHRINE HCL (PRESSORS) 10 MG/ML IV SOLN
INTRAVENOUS | Status: DC | PRN
  Administered 2022-05-02 (×3): 160 ug via INTRAVENOUS
  Administered 2022-05-02 (×4): 80 ug via INTRAVENOUS

## 2022-05-02 MED ORDER — LIDOCAINE 2% (20 MG/ML) 5 ML SYRINGE
INTRAMUSCULAR | Status: AC
  Filled 2022-05-02: qty 5

## 2022-05-02 MED ORDER — PROPOFOL 500 MG/50ML IV EMUL
INTRAVENOUS | Status: DC | PRN
  Administered 2022-05-02: 150 ug/kg/min via INTRAVENOUS

## 2022-05-02 MED ORDER — PROPOFOL 10 MG/ML IV BOLUS
INTRAVENOUS | Status: DC | PRN
  Administered 2022-05-02: 150 mg via INTRAVENOUS

## 2022-05-02 MED ORDER — PROPOFOL 10 MG/ML IV BOLUS
INTRAVENOUS | Status: AC
  Filled 2022-05-02: qty 20

## 2022-05-02 MED ORDER — FENTANYL CITRATE (PF) 100 MCG/2ML IJ SOLN
25.0000 ug | INTRAMUSCULAR | Status: DC | PRN
  Administered 2022-05-02: 50 ug via INTRAVENOUS

## 2022-05-02 MED ORDER — DEXAMETHASONE SODIUM PHOSPHATE 10 MG/ML IJ SOLN
INTRAMUSCULAR | Status: AC
  Filled 2022-05-02: qty 1

## 2022-05-02 MED ORDER — CEFAZOLIN SODIUM-DEXTROSE 2-4 GM/100ML-% IV SOLN
2.0000 g | INTRAVENOUS | Status: AC
  Administered 2022-05-02: 2 g via INTRAVENOUS

## 2022-05-02 MED ORDER — ACETAMINOPHEN 500 MG PO TABS
1000.0000 mg | ORAL_TABLET | Freq: Once | ORAL | Status: AC
  Administered 2022-05-02: 1000 mg via ORAL

## 2022-05-02 MED ORDER — HEPARIN (PORCINE) IN NACL 2-0.9 UNITS/ML
INTRAMUSCULAR | Status: AC | PRN
  Administered 2022-05-02: 1 via INTRAVENOUS

## 2022-05-02 MED ORDER — FENTANYL CITRATE (PF) 100 MCG/2ML IJ SOLN
INTRAMUSCULAR | Status: DC | PRN
  Administered 2022-05-02: 50 ug via INTRAVENOUS

## 2022-05-02 MED ORDER — DEXAMETHASONE SODIUM PHOSPHATE 4 MG/ML IJ SOLN
INTRAMUSCULAR | Status: DC | PRN
  Administered 2022-05-02: 4 mg via INTRAVENOUS

## 2022-05-02 MED ORDER — ACETAMINOPHEN 500 MG PO TABS
ORAL_TABLET | ORAL | Status: AC
  Filled 2022-05-02: qty 2

## 2022-05-02 MED ORDER — HEPARIN SOD (PORK) LOCK FLUSH 100 UNIT/ML IV SOLN
INTRAVENOUS | Status: AC
  Filled 2022-05-02: qty 5

## 2022-05-02 SURGICAL SUPPLY — 71 items
ADH SKN CLS APL DERMABOND .7 (GAUZE/BANDAGES/DRESSINGS) ×2
APL PRP STRL LF DISP 70% ISPRP (MISCELLANEOUS) ×2
APL SKNCLS STERI-STRIP NONHPOA (GAUZE/BANDAGES/DRESSINGS)
APPLIER CLIP 9.375 MED OPEN (MISCELLANEOUS) ×2
APR CLP MED 9.3 20 MLT OPN (MISCELLANEOUS) ×2
BAG DECANTER FOR FLEXI CONT (MISCELLANEOUS) ×2 IMPLANT
BENZOIN TINCTURE PRP APPL 2/3 (GAUZE/BANDAGES/DRESSINGS) ×2 IMPLANT
BINDER BREAST LRG (GAUZE/BANDAGES/DRESSINGS) IMPLANT
BINDER BREAST MEDIUM (GAUZE/BANDAGES/DRESSINGS) IMPLANT
BINDER BREAST XLRG (GAUZE/BANDAGES/DRESSINGS) IMPLANT
BINDER BREAST XXLRG (GAUZE/BANDAGES/DRESSINGS) IMPLANT
BLADE SURG 11 STRL SS (BLADE) ×2 IMPLANT
BLADE SURG 15 STRL LF DISP TIS (BLADE) ×2 IMPLANT
BLADE SURG 15 STRL SS (BLADE) ×2
CANISTER SUC SOCK COL 7IN (MISCELLANEOUS) IMPLANT
CANISTER SUCT 1200ML W/VALVE (MISCELLANEOUS) IMPLANT
CHLORAPREP W/TINT 26 (MISCELLANEOUS) ×2 IMPLANT
CLIP APPLIE 9.375 MED OPEN (MISCELLANEOUS) IMPLANT
CLIP TI WIDE RED SMALL 6 (CLIP) IMPLANT
COVER BACK TABLE 60X90IN (DRAPES) ×2 IMPLANT
COVER MAYO STAND STRL (DRAPES) ×2 IMPLANT
COVER PROBE 5X48 (MISCELLANEOUS)
COVER PROBE CYLINDRICAL 5X96 (MISCELLANEOUS) ×2 IMPLANT
DERMABOND ADVANCED .7 DNX12 (GAUZE/BANDAGES/DRESSINGS) ×2 IMPLANT
DRAPE C-ARM 42X72 X-RAY (DRAPES) ×2 IMPLANT
DRAPE LAPAROSCOPIC ABDOMINAL (DRAPES) ×2 IMPLANT
DRAPE UTILITY XL STRL (DRAPES) ×2 IMPLANT
DRSG TEGADERM 4X4.75 (GAUZE/BANDAGES/DRESSINGS) IMPLANT
ELECT COATED BLADE 2.86 ST (ELECTRODE) ×2 IMPLANT
ELECT REM PT RETURN 9FT ADLT (ELECTROSURGICAL) ×2
ELECTRODE REM PT RTRN 9FT ADLT (ELECTROSURGICAL) ×2 IMPLANT
GAUZE 4X4 16PLY ~~LOC~~+RFID DBL (SPONGE) ×2 IMPLANT
GAUZE SPONGE 4X4 12PLY STRL LF (GAUZE/BANDAGES/DRESSINGS) ×2 IMPLANT
GLOVE BIO SURGEON STRL SZ7 (GLOVE) ×4 IMPLANT
GLOVE BIOGEL PI IND STRL 7.0 (GLOVE) IMPLANT
GLOVE BIOGEL PI IND STRL 7.5 (GLOVE) ×2 IMPLANT
GOWN STRL REUS W/ TWL LRG LVL3 (GOWN DISPOSABLE) ×4 IMPLANT
GOWN STRL REUS W/TWL LRG LVL3 (GOWN DISPOSABLE) ×4
HEMOSTAT ARISTA ABSORB 3G PWDR (HEMOSTASIS) IMPLANT
IV KIT MINILOC 20X1 SAFETY (NEEDLE) IMPLANT
KIT CVR 48X5XPRB PLUP LF (MISCELLANEOUS) IMPLANT
KIT MARKER MARGIN INK (KITS) ×2 IMPLANT
KIT PORT POWER 8FR ISP CVUE (Port) IMPLANT
NDL HYPO 25X1 1.5 SAFETY (NEEDLE) ×2 IMPLANT
NDL SAFETY ECLIP 18X1.5 (MISCELLANEOUS) IMPLANT
NEEDLE HYPO 25X1 1.5 SAFETY (NEEDLE) ×2 IMPLANT
NS IRRIG 1000ML POUR BTL (IV SOLUTION) IMPLANT
PACK BASIN DAY SURGERY FS (CUSTOM PROCEDURE TRAY) ×2 IMPLANT
PENCIL SMOKE EVACUATOR (MISCELLANEOUS) ×2 IMPLANT
RETRACTOR ONETRAX LX 90X20 (MISCELLANEOUS) IMPLANT
SLEEVE SCD COMPRESS KNEE MED (STOCKING) ×2 IMPLANT
SPIKE FLUID TRANSFER (MISCELLANEOUS) IMPLANT
SPONGE T-LAP 4X18 ~~LOC~~+RFID (SPONGE) ×2 IMPLANT
STRIP CLOSURE SKIN 1/2X4 (GAUZE/BANDAGES/DRESSINGS) ×2 IMPLANT
SUT ETHILON 3 0 PS 1 (SUTURE) IMPLANT
SUT MNCRL AB 4-0 PS2 18 (SUTURE) ×2 IMPLANT
SUT MON AB 5-0 PS2 18 (SUTURE) IMPLANT
SUT PROLENE 2 0 SH DA (SUTURE) ×2 IMPLANT
SUT SILK 2 0 SH (SUTURE) IMPLANT
SUT SILK 2 0 TIES 17X18 (SUTURE)
SUT SILK 2-0 18XBRD TIE BLK (SUTURE) IMPLANT
SUT VIC AB 2-0 SH 27 (SUTURE) ×4
SUT VIC AB 2-0 SH 27XBRD (SUTURE) ×2 IMPLANT
SUT VIC AB 3-0 SH 27 (SUTURE) ×4
SUT VIC AB 3-0 SH 27X BRD (SUTURE) ×2 IMPLANT
SYR 5ML LUER SLIP (SYRINGE) ×2 IMPLANT
SYR CONTROL 10ML LL (SYRINGE) ×2 IMPLANT
TOWEL GREEN STERILE FF (TOWEL DISPOSABLE) ×2 IMPLANT
TRAY FAXITRON CT DISP (TRAY / TRAY PROCEDURE) ×2 IMPLANT
TUBE CONNECTING 20X1/4 (TUBING) IMPLANT
YANKAUER SUCT BULB TIP NO VENT (SUCTIONS) IMPLANT

## 2022-05-02 NOTE — Anesthesia Preprocedure Evaluation (Addendum)
Anesthesia Evaluation  Patient identified by MRN, date of birth, ID band Patient awake    Reviewed: Allergy & Precautions, NPO status , Patient's Chart, lab work & pertinent test results  Airway Mallampati: II  TM Distance: >3 FB Neck ROM: Full    Dental no notable dental hx. (+) Teeth Intact, Dental Advisory Given   Pulmonary former smoker   Pulmonary exam normal breath sounds clear to auscultation       Cardiovascular negative cardio ROS Normal cardiovascular exam Rhythm:Regular Rate:Normal     Neuro/Psych negative neurological ROS  negative psych ROS   GI/Hepatic negative GI ROS, Neg liver ROS,,,Crohn's   Endo/Other  negative endocrine ROS    Renal/GU negative Renal ROS  negative genitourinary   Musculoskeletal negative musculoskeletal ROS (+)    Abdominal   Peds  Hematology  (+) REFUSES BLOOD PRODUCTS  Anesthesia Other Findings   Reproductive/Obstetrics                             Anesthesia Physical Anesthesia Plan  ASA: 2  Anesthesia Plan: General   Post-op Pain Management:    Induction: Intravenous  PONV Risk Score and Plan: 3 and Ondansetron, Dexamethasone, Midazolam and TIVA  Airway Management Planned: LMA  Additional Equipment:   Intra-op Plan:   Post-operative Plan: Extubation in OR  Informed Consent: I have reviewed the patients History and Physical, chart, labs and discussed the procedure including the risks, benefits and alternatives for the proposed anesthesia with the patient or authorized representative who has indicated his/her understanding and acceptance.     Dental advisory given  Plan Discussed with: CRNA  Anesthesia Plan Comments:        Anesthesia Quick Evaluation

## 2022-05-02 NOTE — Discharge Instructions (Addendum)
[place] Office Phone Number [phone]  POST OP INSTRUCTIONS Take 400 mg of ibuprofen every 8 hours or 650 mg tylenol every 6 hours for next 72 hours then as needed. Use ice several times daily also.  A prescription for pain medication may be given to you upon discharge.  Take your pain medication as prescribed, if needed.  If narcotic pain medicine is not needed, then you may take acetaminophen (Tylenol), naprosyn (Alleve) or ibuprofen (Advil) as needed. Take your usually prescribed medications unless otherwise directed If you need a refill on your pain medication, please contact your pharmacy.  They will contact our office to request authorization.  Prescriptions will not be filled after 5pm or on week-ends. You should eat very light the first 24 hours after surgery, such as soup, crackers, pudding, etc.  Resume your normal diet the day after surgery. Most patients will experience some swelling and bruising in the breast.  Ice packs and a good support bra will help.  Wear the breast binder provided or a sports bra for 72 hours day and night.  After that wear a sports bra during the day until you return to the office. Swelling and bruising can take several days to resolve.  It is common to experience some constipation if taking pain medication after surgery.  Increasing fluid intake and taking a stool softener will usually help or prevent this problem from occurring.  A mild laxative (Milk of Magnesia or Miralax) should be taken according to package directions if there are no bowel movements after 48 hours. I used skin glue on the incision, you may shower in 24 hours.  The glue will flake off over the next 2-3 weeks.  Any sutures or staples will be removed at the office during your follow-up visit. ACTIVITIES:  You may resume regular daily activities (gradually increasing) beginning the next day.  Wearing a good support bra or sports bra minimizes pain and swelling.  You may have  sexual intercourse when it is comfortable. You may drive when you no longer are taking prescription pain medication, you can comfortably wear a seatbelt, and you can safely maneuver your car and apply brakes. RETURN TO WORK:  ______________________________________________________________________________________ Dennis Bast should see your doctor in the office for a follow-up appointment approximately two weeks after your surgery.  Your doctor's nurse will typically make your follow-up appointment when she calls you with your pathology report.  Expect your pathology report 3-4 business days after your surgery.  You may call to check if you do not hear from [place] after three days. OTHER INSTRUCTIONS: _______________________________________________________________________________________________ _____________________________________________________________________________________________________________________________________ _____________________________________________________________________________________________________________________________________ _____________________________________________________________________________________________________________________________________  WHEN TO CALL DR WAKEFIELD: Fever over 101.0 Nausea and/or vomiting. Extreme swelling or bruising. Continued bleeding from incision. Increased pain, redness, or drainage from the incision.  The clinic staff is available to answer your questions during regular business hours.  Please don't hesitate to call and ask to speak to one of the nurses for clinical concerns.  If you have a medical emergency, go to the nearest emergency room or call 911.  A surgeon from [facility] is always on call at the hospital.  For further questions, please visit [email or web address]    PORT-A-CATH: POST OP INSTRUCTIONS  Always review your discharge instruction sheet given to you by the facility where your surgery was  performed.   A prescription for pain medication may be given to you upon discharge. Take your pain medication as prescribed, if needed. If narcotic pain medicine is not needed,  then you make take acetaminophen (Tylenol) or ibuprofen (Advil) as needed.  Take your usually prescribed medications unless otherwise directed. If you need a refill on your pain medication, please contact our office. All narcotic pain medicine now requires a paper prescription.  Phoned in and fax refills are no longer allowed by law.  Prescriptions will not be filled after 5 pm or on weekends.  You should follow a light diet for the remainder of the day after your procedure. Most patients will experience some mild swelling and/or bruising in the area of the incision. It may take several days to resolve. It is common to experience some constipation if taking pain medication after surgery. Increasing fluid intake and taking a stool softener (such as Colace) will usually help or prevent this problem from occurring. A mild laxative (Milk of Magnesia or Miralax) should be taken according to package directions if there are no bowel movements after 48 hours.  Unless discharge instructions indicate otherwise, you may remove your bandages 48 hours after surgery, and you may shower at that time. You may have steri-strips (small white skin tapes) in place directly over the incision.  These strips should be left on the skin for 7-10 days.  If your surgeon used Dermabond (skin glue) on the incision, you may shower in 24 hours.  The glue will flake off over the next 2-3 weeks.  If your port is left accessed at the end of surgery (needle left in port), the dressing cannot get wet and should only by changed by a healthcare professional. When the port is no longer accessed (when the needle has been removed), follow step 7.   ACTIVITIES:  Limit activity involving your arms for the next 72 hours. Do no strenuous exercise or activity for 1 week. You  may drive when you are no longer taking prescription pain medication, you can comfortably wear a seatbelt, and you can maneuver your car. 10.You may need to see your doctor in the office for a follow-up appointment.  Please       check with your doctor.  11.When you receive a new Port-a-Cath, you will get a product guide and        ID card.  Please keep them in case you need them.  WHEN TO CALL YOUR DOCTOR [phone]): Fever over 101.0 Chills Continued bleeding from incision Increased redness and tenderness at the site Shortness of breath, difficulty breathing   The clinic staff is available to answer your questions during regular business hours. Please don't hesitate to call and ask to speak to one of the nurses or medical assistants for clinical concerns. If you have a medical emergency, go to the nearest emergency room or call 911.  A surgeon from [facility] is always on call at the hospital.     For further information, please visit [email or web address]    Post Anesthesia Home Care Instructions  Activity: Get plenty of rest for the remainder of the day. A responsible individual must stay with you for 24 hours following the procedure.  For the next 24 hours, DO NOT: -Drive a car -Paediatric nurse -Drink alcoholic beverages -Take any medication unless instructed by your physician -Make any legal decisions or sign important papers.  Meals: Start with liquid foods such as gelatin or soup. Progress to regular foods as tolerated. Avoid greasy, spicy, heavy foods. If nausea and/or vomiting occur, drink only clear liquids until the nausea and/or vomiting subsides. Call your physician if vomiting continues.  Special  Instructions/Symptoms: Your throat may feel dry or sore from the anesthesia or the breathing tube placed in your throat during surgery. If this causes discomfort, gargle with warm salt water. The discomfort should disappear within 24  hours.  Can take tylenol after 3:20 p

## 2022-05-02 NOTE — H&P (Signed)
4 yof who has fh in maternal aunt who had a palpable mass noted on her own exam in 2022. she has d density mammogram. she had masses at 9 oclock measuring 2.1 cm and 10 oclock 1.9 cm and numerous other masses from 8 to 10. there is one abnormal axillary node. the clips are 4.7 cm apart. 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 and Ki is 30%. She has completed chemotherapy. Repeat mri shows a good response with only small amount of enhancement. The prior biopsied node is smaller. After discussion she had strong desire to undergo nsm. I did risk reducing left nsm with expander by Dr Claudia Desanctis. I did right alnd with 12 negative nodes and then a right nsm with very little residual invasive disease but extensive dcis. This extends to anterior margin but not at nipple. This is er pos, pr neg her 2 pos. She did well. She has had GI issues that I have seen her in hospital for that are now better. She then noted a palpable mass. She was seen by plastic surgery. She underwent imaging. She has a 0.7x0.4x1.2 cm mass at 10 oclock. There are no abnormal right sided nodes present. Biopsy is a grade II IDC with DCIS with 20% er pos, pr neg , her 2 positive. She has seen oncology plan is for [facility].  Review of Systems: A complete review of systems was obtained from the patient. I have reviewed this information and discussed as appropriate with the patient. See HPI as well for other ROS.  Review of Systems All other systems reviewed and are negative.  Medical History: Past Medical History: Diagnosis Date Anemia Arthritis History of cancer  Patient Active Problem List Diagnosis Malignant neoplasm of upper-outer quadrant of right breast in female, estrogen receptor positive  Past Surgical History: Procedure Laterality Date COLON SURGERY DEEP  AXILLARY SENTINEL NODE BIOPSY / EXCISION Right MASTECTOMY Bilateral nsm  Allergies Allergen Reactions Nsaids (Non-Steroidal Anti-Inflammatory Drug) Other (See Comments) Crohns disease/ IBD Other Unknown BLOOD PRODUCT REFUSAL BLOOD PRODUCT REFUSAL   Current Outpatient Medications on File Prior to Visit Medication Sig Dispense Refill ferrous sulfate-vitamin C-folic acid (FOLITAB) ER tablet ferrous sulfate mesalamine (LIALDA) 1.2 gram EC tablet Take by mouth multivitamin tablet Take 1 tablet by mouth once daily sulfamethoxazole-trimethoprim (BACTRIM DS) 800-160 mg tablet Take 1 tablet by mouth 2 (two) times daily (Patient not taking: Reported on 10/13/2021)  No current facility-administered medications on file prior to visit.  Family History Problem Relation Age of Onset High blood pressure (Hypertension) Father Diabetes Father   Social History  Tobacco Use Smoking Status Never Smokeless Tobacco Never Marital status: Single Tobacco Use Smoking status: Never Smokeless tobacco: Never Substance and Sexual Activity Alcohol use: Yes Drug use: Never  Objective:  Vitals: 04/14/22 1018 04/14/22 1024 BP: 118/78 Pulse: (!) 125 Temp: 36.9 C (98.4 F) SpO2: 98% Weight: 60.3 kg (133 lb) Height: 162.6 cm ('5\' 4"'$ ) PainSc: 0-No pain PainLoc: Breast  Body mass index is 22.83 kg/m.  Physical Exam Vitals reviewed. Constitutional: Appearance: Normal appearance. Chest: Breasts: Right: Mass present. No inverted nipple or nipple discharge. Left: No inverted nipple, mass or nipple discharge. Comments: Bilateral expanders Lymphadenopathy: Upper Body: Right upper body: No supraclavicular or axillary adenopathy. Left upper body: No supraclavicular or axillary adenopathy. Neurological: Mental Status: She is alert.  Assessment and Plan:  Recurrent right breast cancer  Wide local excision and port placement  We discussed the staging and pathophysiology of breast cancer.  We discussed all of the different options for treatment for breast cancer including surgery, chemotherapy, radiation therapy, Herceptin, and antiestrogen therapy.  I dont think will plan on node biopsy as this will not change treatment and there are no abnormal nodes. Dont think a sn is valid at this point.  Will plan to leave implant for now and try to excise this with margin. Discussed possible need for further surgery.  Will put port in same place again on left side.  We discussed the risks of operation including bleeding, infection, possible reoperation. She understands her further therapy will be based on what her stages at the time of her operation.

## 2022-05-02 NOTE — Transfer of Care (Signed)
Immediate Anesthesia Transfer of Care Note  Patient: [name]  Procedure(s) Performed: INSERTION PORT-A-CATH (Left: Chest) RADIOACTIVE SEED GUIDED RIGHT BREAST MASS EXCISION (Right: Breast)  Patient Location: PACU  Anesthesia Type:General  Level of Consciousness: sedated  Airway & Oxygen Therapy: Patient Spontanous Breathing and Patient connected to face mask oxygen  Post-op Assessment: Report given to RN and Post -op Vital signs reviewed and stable  Post vital signs: Reviewed and stable  Last Vitals:  Vitals Value Taken Time  BP 99/63 05/02/22 1230  Temp    Pulse 76 05/02/22 1231  Resp 15 05/02/22 1231  SpO2 100 % 05/02/22 1231  Vitals shown include unvalidated device data.  Last Pain:  Vitals:   05/02/22 0916  PainSc: 0-No pain      Patients Stated Pain Goal: 5 (16/10/96 0454)  Complications: No notable events documented.

## 2022-05-02 NOTE — Anesthesia Postprocedure Evaluation (Signed)
Anesthesia Post Note  Patient: SHERITHA LOUIS  Procedure(s) Performed: INSERTION PORT-A-CATH (Left: Chest) RADIOACTIVE SEED GUIDED RIGHT BREAST MASS EXCISION (Right: Breast)     Patient location during evaluation: PACU Anesthesia Type: General Level of consciousness: awake and alert Pain management: pain level controlled Vital Signs Assessment: post-procedure vital signs reviewed and stable Respiratory status: spontaneous breathing, nonlabored ventilation, respiratory function stable and patient connected to nasal cannula oxygen Cardiovascular status: blood pressure returned to baseline and stable Postop Assessment: no apparent nausea or vomiting Anesthetic complications: no  No notable events documented.  Last Vitals:  Vitals:   05/02/22 1345 05/02/22 1400  BP: 109/79 131/84  Pulse:  77  Resp:  18  Temp:  36.4 C  SpO2:  100%    Last Pain:  Vitals:   05/02/22 1420  PainSc: 2                  Sasuke Yaffe L Rafaelita Foister

## 2022-05-02 NOTE — Interval H&P Note (Signed)
History and Physical Interval Note:  05/02/2022 10:41 AM  [name]  has presented today for surgery, with the diagnosis of BREAST CANCER RECURRENCE.  The various methods of treatment have been discussed with the patient and family. After consideration of risks, benefits and other options for treatment, the patient has consented to  Procedure(s): INSERTION PORT-A-CATH (N/A) RADIOACTIVE SEED GUIDED RIGHT BREAST MASS EXCISION (Right) as a surgical intervention.  The patient's history has been reviewed, patient examined, no change in status, stable for surgery.  I have reviewed the patient's chart and labs.  Questions were answered to the patient's satisfaction.     [name]

## 2022-05-02 NOTE — Anesthesia Procedure Notes (Signed)
Procedure Name: LMA Insertion Date/Time: 05/02/2022 11:17 AM  Performed by: Maryella Shivers, CRNAPre-anesthesia Checklist: Patient identified, Emergency Drugs available, Suction available and Patient being monitored Patient Re-evaluated:Patient Re-evaluated prior to induction Oxygen Delivery Method: Circle system utilized Preoxygenation: Pre-oxygenation with 100% oxygen Induction Type: IV induction Ventilation: Mask ventilation without difficulty LMA: LMA inserted LMA Size: 4.0 Number of attempts: 1 Airway Equipment and Method: Bite block Placement Confirmation: positive ETCO2 Tube secured with: Tape Dental Injury: Teeth and Oropharynx as per pre-operative assessment

## 2022-05-02 NOTE — Op Note (Signed)
Preoperative diagnosis: Recurrent right breast cancer Postoperative diagnosis: Same as above Procedure: 1.  Right breast mass radioactive seed guided wide local excision 2.  Left internal jugular port placement Surgeon: Dr. Serita Grammes Anesthesia: General Estimated blood loss: Minimal Specimens: 1.  Right breast mass containing seed and clip marked with paint 2.  Additional inferior and superolateral margins marked short superior, long lateral Complications: Drains: None Special counts correct completion Disposition recovery stable addition  Indications: Is a 47 year old female with a history of Crohn's disease who had in 2022 was diagnosed with a right breast cancer.  She underwent primary chemotherapy followed by bilateral nipple sparing mastectomy as well as removal of her axillary nodes.  She had 12 negative nodes and very little residual invasive disease present.  She had done well and she noted a mass that was present.  She was evaluated underwent ultrasound that showed a 1.2 cm mass at 10:00.  There were no abnormal right-sided nodes.  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  She has been seen in multidisciplinary fashion we elected to try a wide local excision as well as beginning Kadcyla.  Procedure: After informed consent was obtained she was taken to the operating room.  She refuses blood products if needed and that paperwork was signed.  She was then placed under general anesthesia without complication.  She been given antibiotics.  SCDs were placed.  She was prepped and draped in standard sterile surgical fashion.  Timeout was then performed.  I identified the seed at the 10 o'clock position near the areolar border.  I then removed the skin overlying the mass in an elliptical fashion I used cautery to then remove the mass and some of the surrounding tissue.  There is very little tissue due to her prior mastectomy.  I entered  the implant capsule and was visualized the implant.  I remove the entire capsule below this mass.  I then did a mammogram which confirmed removal of the seed and the clip.  I did take additional margins as above.  There really is no other tissue in this vicinity at this point.  I then closed the capsule back with 2-0 Vicryl taking care not to injure the implant.  I then closed the skin with 3-0 Vicryl and 4-0 Monocryl.  I did place several 3-0 nylon sutures externally.  I then placed the port.  I was able to identify her left internal jugular vein with the ultrasound.  I accessed the vein under direct vision.  I then passed the wire.  The wire was confirmed to be in position both by fluoroscopy and ultrasound.  I then made an incision at the site of her old port.  I created a pocket.  I tunneled line between the 2 sides.  I then placed the dilator over the wire and under fluoroscopy watch this going to good position.  The wire assembly was removed.  I placed a line through the peel-away sheath.  The sheath was removed.  The line was brought back to be at the cavoatrial junction and the distal vena cava and is now ready for use.  I hooked this up to the port and placed this in the pocket.  I sutured this down with a 2-0 Prolene suture.  This flushed easily and aspirated blood.  I placed heparin in the port.  I then closed this with 3-0 Vicryl and 4-0 Monocryl.  Glue and Steri-Strips were  applied.  She tolerated this well was extubated transferred recovery stable.

## 2022-05-03 ENCOUNTER — Encounter (HOSPITAL_BASED_OUTPATIENT_CLINIC_OR_DEPARTMENT_OTHER): Payer: Self-pay | Admitting: General Surgery

## 2022-05-03 ENCOUNTER — Ambulatory Visit: Payer: Managed Care, Other (non HMO)

## 2022-05-03 LAB — SURGICAL PATHOLOGY

## 2022-05-08 ENCOUNTER — Encounter: Payer: Self-pay | Admitting: *Deleted

## 2022-05-10 ENCOUNTER — Ambulatory Visit: Payer: Managed Care, Other (non HMO) | Admitting: Physical Therapy

## 2022-05-10 ENCOUNTER — Encounter: Payer: Self-pay | Admitting: Physical Therapy

## 2022-05-10 DIAGNOSIS — I890 Lymphedema, not elsewhere classified: Secondary | ICD-10-CM | POA: Diagnosis not present

## 2022-05-10 DIAGNOSIS — M79644 Pain in right finger(s): Secondary | ICD-10-CM

## 2022-05-10 DIAGNOSIS — R293 Abnormal posture: Secondary | ICD-10-CM

## 2022-05-10 NOTE — Therapy (Signed)
OUTPATIENT PHYSICAL THERAPY ONCOLOGY TREATMENT  Patient Name: [name] MRN: [id number] DOB:12-Dec-1975, 47 y.o., female Today's Date: 05/10/2022  END OF SESSION:  PT End of Session - 05/10/22 1700     Visit Number 4    Number of Visits 7    Date for PT Re-Evaluation 06/21/22    PT Start Time 1605    PT Stop Time 7564    PT Time Calculation (min) 50 min    Activity Tolerance Patient tolerated treatment well    Behavior During Therapy [facility] for tasks assessed/performed              Past Medical History:  Diagnosis Date   Cancer ([place])    Colon stricture ([place]) 05/07/2019   Crohn's colitis, with intestinal obstruction ([place]) 09/17/2013   Dx 2006 - right and left colon involved 2011 - pan-colitis 09/17/2013 left colitis 60-20 cm March 2021 subtotal colectomy ileosigmoid anastomosis for colonic strictures-Dr. Marcello Moores     Crohn's disease [facility])    Family history of breast cancer 06/11/2020   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 10/17/2010   Rectal bleeding    Uterine fibroid    Vitamin D deficiency 09/24/2013   Past Surgical History:  Procedure Laterality Date   AXILLARY LYMPH NODE DISSECTION Right 11/09/2020   Procedure: RIGHT AXILLARY LYMPH NODE DISSECTION;  Surgeon: [name], MD;  Location: [place];  Service: General;  Laterality: Right;   BIOPSY  07/01/2020   Procedure: BIOPSY;  Surgeon: Yetta Flock, MD;  [facility];  Service: Gastroenterology;;   BIOPSY  01/27/2021   Procedure: BIOPSY;  Surgeon: Yetta Flock, MD;  [facility];  Service: Gastroenterology;;   BIOPSY  09/07/2021   Procedure: BIOPSY;  Surgeon: Jackquline Denmark, MD;  [facility];  Service: Gastroenterology;;   BREAST BIOPSY Right 03/24/2022   [place] RT BREAST BX W LOC DEV 1ST LESION IMG BX [place] US GUIDE 03/24/2022 [facility]   BREAST BIOPSY  04/28/2022   [place] RT RADIOACTIVE SEED LOC 04/28/2022  [facility]   BREAST RECONSTRUCTION WITH PLACEMENT OF TISSUE EXPANDER AND FLEX HD (ACELLULAR HYDRATED DERMIS) Bilateral 11/09/2020   Procedure: BILATERAL BREAST RECONSTRUCTION WITH PLACEMENT OF TISSUE EXPANDER AND FLEX HD (ACELLULAR HYDRATED DERMIS);  Surgeon: [name], MD;  Location: [place];  Service: Plastics;  Laterality: Bilateral;   CAPSULOTOMY Bilateral 07/25/2021   Procedure: CAPSULOTOMY;  Surgeon: [name], MD;  Location: [place];  Service: Plastics;  Laterality: Bilateral;   COLECTOMY  06/27/2019   COLONOSCOPY  2015   FLEXIBLE SIGMOIDOSCOPY N/A 07/01/2020   Procedure: FLEXIBLE SIGMOIDOSCOPY;  Surgeon: Yetta Flock, MD;  [facility];  Service: Gastroenterology;  Laterality: N/A;   FLEXIBLE SIGMOIDOSCOPY N/A 01/27/2021   Procedure: FLEXIBLE SIGMOIDOSCOPY;  Surgeon: Yetta Flock, MD;  [facility];  Service: Gastroenterology;  Laterality: N/A;   FLEXIBLE SIGMOIDOSCOPY N/A 09/07/2021   Procedure: FLEXIBLE SIGMOIDOSCOPY;  Surgeon: Jackquline Denmark, MD;  [facility];  Service: Gastroenterology;  Laterality: N/A;   NIPPLE SPARING MASTECTOMY Bilateral 11/09/2020   Procedure: BILATERAL NIPPLE SPARING MASTECTOMY;  Surgeon: [name], MD;  Location: [place];  Service: General;  Laterality: Bilateral;   PORT-A-CATH REMOVAL N/A 07/25/2021   Procedure: REMOVAL PORT-A-CATH;  Surgeon: [name], MD;  Location: [place];  Service: Plastics;  Laterality: N/A;   PORTACATH PLACEMENT N/A 06/23/2020   Procedure: INSERTION PORT-A-CATH;  Surgeon:  [name], MD;  Location: [place];  Service: General;  Laterality: N/A;  START TIME OF 11:00 AM FOR 60 MINUTES [place] [facility]   PORTACATH PLACEMENT Left 05/02/2022   Procedure: INSERTION PORT-A-CATH;  Surgeon: [name], MD;  Location: [place];  Service: General;  Laterality:  Left;   RADIOACTIVE SEED GUIDED EXCISIONAL BREAST BIOPSY Right 05/02/2022   Procedure: RADIOACTIVE SEED GUIDED RIGHT BREAST MASS EXCISION;  Surgeon: [name], MD;  Location: [place];  Service: General;  Laterality: Right;   REMOVAL OF BILATERAL TISSUE EXPANDERS WITH PLACEMENT OF BILATERAL BREAST IMPLANTS Bilateral 07/25/2021   Procedure: REMOVAL OF BILATERAL TISSUE EXPANDERS WITH PLACEMENT OF BILATERAL BREAST IMPLANTS;  Surgeon: [name], MD;  Location: [place];  Service: Plastics;  Laterality: Bilateral;  1.5   Patient Active Problem List   Diagnosis Date Noted   Leucocytosis 05/27/2021   Crohn's disease of small and large intestines with complication ([place])    SBO (small bowel obstruction) ([place]) 01/26/2021   History of Crohn's disease    Breast cancer ([place]) 11/09/2020   Port-A-Cath in place 07/15/2020   Genetic testing 06/25/2020   Family history of breast cancer 06/11/2020   Malignant neoplasm of upper-outer quadrant of right breast in female, estrogen receptor positive ([place]) 06/04/2020   Abnormal finding present on diagnostic imaging of uterus 05/07/2019   Vitamin D deficiency 09/24/2013   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 10/17/2010    PCP: Does not have one  REFERRING PROVIDER: Wilber Bihari, NP  REFERRING DIAG: C50.911 (ICD-10-CM) - Recurrent breast cancer, right ([place])  THERAPY DIAG:  Lymphedema, not elsewhere classified  Pain of right thumb  Abnormal posture  ONSET DATE: 01/16/22  Rationale for Evaluation and Treatment: Rehabilitation  SUBJECTIVE:                                                                                                                                                                                           SUBJECTIVE STATEMENT: The finger swelling is down but I still have a little swelling on the back of my hand. I have been doing my exercises. The pain  in my thumb is doing better.   PERTINENT HISTORY:  Patient was diagnosed on 06/04/2020 with right grade III invasive ductal carcinoma breast cancer. She neoadjuvant chemotherapy from 06/25/2020 - 10/29/2020 followed by bilateral mastectomies with expanders placed on 11/09/2020 with 0/12 axillary nodes positive for cancer. It is ER positive, PR negative, and HER2 negative with a Ki67 of 40%. 03/28/22- diagnosed with R recurrence and will undergo a lumpectomy PAIN:  Are you having pain? Yes NPRS scale: 3/10 when moving R thumb no pain in sitting Pain location: R thumb Pain orientation: Right  PAIN TYPE: sharp Pain description: intermittent  Aggravating factors: moving the thumb, lifting Relieving factors: not doing anything  PRECAUTIONS: Other: active cancer - recurrent R breast cancer  WEIGHT BEARING RESTRICTIONS: No  FALLS:  Has patient fallen in last 6 months? No  LIVING ENVIRONMENT: Lives with: lives with their daughter and grandson who is 81 months old Lives in: House/apartment Stairs: No;  Has following equipment at home: None  OCCUPATION: full time - Careers information officer  LEISURE: walks, lifts weights 5lbs, squats 2x/wk  HAND DOMINANCE: right   PRIOR LEVEL OF FUNCTION: Independent  PATIENT GOALS: to get the swelling down and the pain to stop   OBJECTIVE:  COGNITION: Overall cognitive status: Within functional limits for tasks assessed   PALPATION: Tenderness along thumb extending to wrist  OBSERVATIONS / OTHER ASSESSMENTS: R hand is visibly swollen  POSTURE: forward head, rounded shoulders  UPPER EXTREMITY AROM/PROM: WFL   LYMPHEDEMA ASSESSMENTS:   SURGERY TYPE/DATE: bilateral mastectomy 11/09/20 and will need a lumpectomy for recent recurrence on R side  NUMBER OF LYMPH NODES REMOVED: 0/12  CHEMOTHERAPY: completed  RADIATION:completed 2022  LYMPHEDEMA ASSESSMENTS:   LANDMARK RIGHT  eval RIGHT 04/20/2022 RIGHT 05/10/22  10 cm proximal to olecranon process 25.4  26.7   Olecranon process 22.2 23.2   10 cm proximal to ulnar styloid process 18.4 19.6   Just proximal to ulnar styloid process 15.3 15.1 15.3  Across hand at thumb web space 19.2 19.5 19.1  At base of 2nd digit 6.5 6.6 6  (Blank rows = not tested)  LANDMARK LEFT  eval  10 cm proximal to olecranon process 25.5  Olecranon process 23  10 cm proximal to ulnar styloid process 18.4  Just proximal to ulnar styloid process 14.5  Across hand at thumb web space 17.9  At base of 2nd digit 5.7  (Blank rows = not tested)    L-DEX LYMPHEDEMA SCREENING:  The patient was assessed using the L-Dex machine today to produce a lymphedema index baseline score. The patient will be reassessed on a regular basis (typically every 3 months) to obtain new L-Dex scores. If the score is > 6.5 points away from his/her baseline score indicating onset of subclinical lymphedema, it will be recommended to wear a compression garment for 4 weeks, 12 hours per day and then be reassessed. If the score continues to be > 6.5 points from baseline at reassessment, we will initiate lymphedema treatment. Assessing in this manner has a 95% rate of preventing clinically significant lymphedema.   L-DEX FLOWSHEETS - 05/10/22 1600       L-DEX LYMPHEDEMA SCREENING   Measurement Type Unilateral    L-DEX MEASUREMENT EXTREMITY Upper Extremity    POSITION  Standing    DOMINANT SIDE Right    At Risk Side Right    BASELINE SCORE (UNILATERAL) -4.4    L-DEX SCORE (UNILATERAL) 15.3    VALUE CHANGE (UNILAT) 19.7              QUICK DASH SURVEY:         LLIS: 23.53  TODAY'S TREATMENT:  DATE:  05/10/22:  Educated pt how to apply compression bandaging from wrist to axilla. She was previously instructed in how to apply hand and finger bandages. Cut Komprex II foam and 1/2 grey foam to  alternate using on back of hand with bandaging to see if that will help decrease edema at back of hand. Educated pt that she can don Komprex II inside her glove on the dorsum of hand. Remeasured l dex today and pt has increased since last session. At this point she most likely has transitioned from subclinical lymphedema to lymphedema. Educated pt to continue to wear her sleeve during the day and to begin bandaging hand and arm every night for the next 2 weeks and then will reassess at next session and answer any questions she has. She has been wearing her glove and demonstrates great reduction in finger edema but still has edema at dorsum of hand.   04/26/2022 Reviewed self MLD with pt in sitting to the right UE with VC's for correct pressure and sequence, Short neck, 5 diaphragmatic breaths, activate left axillary and right inguinal LN's, Anterior interaxillary pathway, right axillo-inguinal pathway, Right outer arm, inner to outer arm, outer arm then retracing steps then forearm retracing steps and hand retracing steps and ending with LN's. Pt required occasional VC's and TC's but did very well overall. Discussed HSA and if pt had enough that she could use to cover her sleeve and glove.  She had enough money so she was given info to order [employer] and glove in 30-40 compression. Pt applied her sleeve and therapist started instructing pt in finger wrap and PT performed once and then pt performed. She struggled initially with keeping wrap straight but improved as she did more. She used the counter for assist. She had to leave at 4:45 to get her grandchild from daycare so I applied hand wrap with instruction to pt. I gave her video instructions for bandaging. Showed her how to check capillary refill and instructed to remove if wrap is ever painful or causes numbness.  She knows she must remove sleeve and wrap before going to bed and reapply in am.Pt verbalized understanding.   04/20/2022 Pt shown  how to remove brace and range her wrist so it doesn't get tight, and advised not to wear at night to see if finger/hand swelling will improve and not to get wrist area on brace too tight. Measured arm for therapy note and for sleeve and glove fit. She measured into a Size 1 Medi Harmony sleeve and size 2  Harmony glove. I had her try on the harmony sleeve here and it fit well. We discussed insurance vs paying outright. She will be a full pay for sleeve right now since she hasn't met her out of pocket. Cost with compression guru is nearly $200.00. She would like to wait until after her surgery to order her sleeve, and would like to try wrapping her hand while she wears the sleeve she has. Advised we could try that next visit. Had pt sit in a chair so she could observe me and instructed in self MLD to the right UE with VC's for correct pressure and sequence, Short neck, 5 diaphragmatic breaths, activate left axillary and right inguinal LN's, Anterior interaxillary pathway, right axillo-inguinal pathway, Right outer arm, inner to outer arm, outer arm then retracing steps then forearm retracing steps and hand retracing steps and ending with LN's. Pt was given a handout for home use  04/07/22: Educated pt to purchase a brace for her wrist and thumb to immobilize her thumb to allow it to heal. Issued 3 exercises to help decrease pain along tendons of R thumb. Educated her to wear the brace during daytime hours for about a week and to avoid twisting motions and lifting. Also educated pt that she may need a custom compression sleeve but will assess for this at next visit.   PATIENT EDUCATION:  Education details: tenosynovitis and causes, need for wrist splint, exercises to help with thumb pain, lymphedema vs subclinical lymphedema Person educated: Patient Education method: Explanation, Demonstration, and Handouts Education comprehension: verbalized understanding and returned demonstration  HOME EXERCISE  PROGRAM: , Thumb lifts, Place your hand on a flat surface, with your palm up. Lift your thumb away from your palm to make a "C" shape. Hold for about 6 seconds. Repeat 8 to 12 times. Passive thumb MP flexion, Hold your hand in front of you, and turn your hand so your little finger faces down and your thumb faces up. (Your hand should be in the position used for shaking someone's hand.) You may also rest your hand on a flat surface. Use the fingers on your other hand to bend your thumb down at the point where your thumb connects to your palm. Hold for at least 15 to 30 seconds. Repeat 2 to 4 times. Finkelstein stretch, Hold your arms out in front of you. (Your hand should be in the position used for shaking someone's hand.) Bend your thumb toward your palm. Use your other hand to gently stretch your thumb and wrist downward until you feel a stretch on the thumb side of your wrist. Hold for at least 15 to 30 seconds. Repeat 2 to 4 times. It's a good idea to repeat these steps with your other wrist.    L-DEX FLOWSHEETS - 05/10/22 1600       L-DEX LYMPHEDEMA SCREENING   Measurement Type Unilateral    L-DEX MEASUREMENT EXTREMITY Upper Extremity    POSITION  Standing    DOMINANT SIDE Right    At Risk Side Right    BASELINE SCORE (UNILATERAL) -4.4    L-DEX SCORE (UNILATERAL) 15.3    VALUE CHANGE (UNILAT) 19.7              The patient was assessed using the L-Dex machine today to produce a lymphedema index baseline score. The patient will be reassessed on a regular basis (typically every 3 months) to obtain new L-Dex scores. If the score is > 6.5 points away from his/her baseline score indicating onset of subclinical lymphedema, it will be recommended to wear a compression garment for 4 weeks, 12 hours per day and then be reassessed. If the score continues to be > 6.5 points from baseline at reassessment, we will initiate lymphedema treatment. Assessing in this manner has a 95% rate of  preventing clinically significant lymphedema.   ASSESSMENT:  CLINICAL IMPRESSION: Pt obtained her compression glove and has been wearing that and her sleeve for 10-12 hrs a day. Her finger circumferences have decreased greatly since last session but she still presents with swelling at dorsum of hand and wrist. Her ldex score increased since the end of December. Educated pt to begin bandaging her entire hand and arm at night with foam at dorsum of hand to decrease edema and continue to wear her sleeve and glove during day time hours. Pt would benefit from continued skilled PT services every other week to continue to  progress pt towards independent management of lymphedema.    OBJECTIVE IMPAIRMENTS: decreased knowledge of condition, decreased knowledge of use of DME, increased edema, increased fascial restrictions, impaired UE functional use, and pain.   ACTIVITY LIMITATIONS: carrying, lifting, and caring for others  PARTICIPATION LIMITATIONS: meal prep, cleaning, and occupation  PERSONAL FACTORS: Time since onset of injury/illness/exacerbation are also affecting patient's functional outcome.   REHAB POTENTIAL: Good  CLINICAL DECISION MAKING: Stable/uncomplicated  EVALUATION COMPLEXITY: Low  GOALS: Goals reviewed with patient? Yes  LONG TERM GOALS=SHORT TERM GOALS Target date: 05/05/22  Pt will report no pain in thumb and wrist when moving her thumb.  Baseline:  Goal status: PARTIALLY MET 50% improved on 05/10/22  2.  Pt will obtain an appropriate compression sleeve and glove for long term management of lymphedema.  Baseline:  Goal status: MET 05/10/22  4.  Pt will be independent in self MLD for long term management of lymphedema  Baseline:  Goal status: MET 05/10/22  5.  Pt will be independent in a home exercise program to decrease thumb and wrist pain on R side.  Baseline:  Goal status: MET 05/10/22  6.  Pt will demonstrate a 0.5 decrease in edema at dorsum of hand to allow  improved comfort.  Baseline:  Goal status: IN PROGRESS  PLAN:  PT FREQUENCY: every other week  PT DURATION: 6 weeks  PLANNED INTERVENTIONS: Therapeutic exercises, Therapeutic activity, Patient/Family education, Self Care, Joint mobilization, Orthotic/Fit training, DME instructions, Manual lymph drainage, Compression bandaging, scar mobilization, Taping, Manual therapy, and Re-evaluation  PLAN FOR NEXT SESSION: how did bandaging go?, review MLD to RUE prn, review bandaging prn   Manus Gunning, PT 05/10/2022, 5:02 PM  [facility] [address] [place], [place], [address] Phone: [phone]   Fax:  [phone]

## 2022-05-12 ENCOUNTER — Inpatient Hospital Stay: Payer: Managed Care, Other (non HMO) | Attending: Hematology and Oncology | Admitting: Hematology and Oncology

## 2022-05-12 ENCOUNTER — Telehealth: Payer: Self-pay

## 2022-05-12 VITALS — BP 116/86 | HR 89 | Temp 97.3°F | Resp 17 | Wt 135.1 lb

## 2022-05-12 DIAGNOSIS — R110 Nausea: Secondary | ICD-10-CM | POA: Insufficient documentation

## 2022-05-12 DIAGNOSIS — Z170 Estrogen receptor positive status [ER+]: Secondary | ICD-10-CM | POA: Diagnosis not present

## 2022-05-12 DIAGNOSIS — C50411 Malignant neoplasm of upper-outer quadrant of right female breast: Secondary | ICD-10-CM | POA: Diagnosis not present

## 2022-05-12 DIAGNOSIS — K521 Toxic gastroenteritis and colitis: Secondary | ICD-10-CM | POA: Diagnosis not present

## 2022-05-12 DIAGNOSIS — Z79899 Other long term (current) drug therapy: Secondary | ICD-10-CM | POA: Insufficient documentation

## 2022-05-12 DIAGNOSIS — Z5112 Encounter for antineoplastic immunotherapy: Secondary | ICD-10-CM | POA: Diagnosis present

## 2022-05-12 DIAGNOSIS — Z5181 Encounter for therapeutic drug level monitoring: Secondary | ICD-10-CM | POA: Insufficient documentation

## 2022-05-12 DIAGNOSIS — Z452 Encounter for adjustment and management of vascular access device: Secondary | ICD-10-CM | POA: Diagnosis not present

## 2022-05-12 DIAGNOSIS — R5383 Other fatigue: Secondary | ICD-10-CM | POA: Insufficient documentation

## 2022-05-12 NOTE — Assessment & Plan Note (Addendum)
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 palpable right breast mass measuring about 7 cm x 5 and half centimeters, palpable right axillary lymph node with concern for skin invasion    Treatment Plan: 1. Neoadjuvant chemo with TCHP q 3 weeks X 6 followed by HP completed 07/07/2021 2. Bilateral mastectomies: Left mastectomy: Benign Right mastectomy: Residual microinvasive cancer status post neoadjuvant therapy, DCIS, 0/12 lymph nodes, ER +1%, PR negative, HER2 equivocal 2+ by IHC 3. Adj XRT completed 02/23/2021 4. Adj Anti-estrogen therapy with tamoxifen started 06/08/2021 discontinued 06/24/2021 (ER was only 1% therefore we felt risks outweigh benefits) 5. Neratinib: Because of her bowel issues she decided not to take it. --------------------------------------------------------------------------------------------------------- Hospitalization 05/18/2021-06/13/2021 (small bowel obstruction) Right chest palpable nodule: 1.2 cm mass at 10 o'clock position biopsy revealed grade 2 IDC with high-grade DCIS ER 20% positive weak staining, PR 0%, HER2 3+ positive, Ki-67 50%  05/02/2022: Right lumpectomy: Recurrent grade 3 IDC, posterior margin focally involved by cancer  Treatment plan: Adjuvant Kadcyla Once again discussed the pros and cons of Kadcyla and that side effects. She will return back to see is on the first day for treatment.

## 2022-05-12 NOTE — Telephone Encounter (Signed)
Pt scheduled to see Dr. Carlean Purl on 06/28/2022 at 11:10 AM for an office visit. Pt made aware through my chart. Pt verbalized understanding with all questions answered.

## 2022-05-12 NOTE — Telephone Encounter (Signed)
Appointment Received: [name], Ofilia Neas, MD  Gillermina Hu, RN She is going to put her Weyman Rodney on hold because of chemotherapy for breast cancer.  I would like to see her in the office sometime in March to check up on her.  I told her we would be contacting her about an appointment (through [facility])  Thanks

## 2022-05-12 NOTE — Progress Notes (Signed)
Patient Care Team: Patient, [name] as PCP - General (General Practice) Courtney Kaufmann, RN as Oncology Nurse Navigator Courtney Germany, RN as Oncology Nurse Navigator Courtney Ruff, MD as Consulting Physician (Colon and Rectal Surgery) Courtney Mayer, MD as Consulting Physician (Gastroenterology) Courtney Bookbinder, MD as Consulting Physician (General Surgery) Courtney Lose, MD as Consulting Physician (Hematology and Oncology)  DIAGNOSIS:  Encounter Diagnosis  Name Primary?   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 palpable right breast mass measuring about 7 cm x 5 and half centimeters, palpable right axillary lymph node with concern for skin invasion    06/08/2020 Cancer Staging   Staging form: Breast, AJCC 8th Edition - Clinical: Stage IIIB (cT4d, cN1, cM0, G3, ER+, PR-, HER2+) - Signed by Courtney Lose, MD on 07/07/2020 Histologic grading system: 3 grade system   06/24/2020 Genetic Testing   No pathogenic variants detected in [employer] [facility]RNA[employer]ight.  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  The report date is June 24, 2020.   The CustomNext-Cancer+RNAinsight panel offered by [facility] includes sequencing and rearrangement analysis for the following 47 genes:  APC, ATM, AXIN2, BARD1, BMPR1A, BRCA1, BRCA2, BRIP1, CDH1, CDK4, CDKN2A, CHEK2, DICER1, EPCAM, GREM1, HOXB13, MEN1, MLH1, MSH2, MSH3, MSH6, MUTYH, NBN, NF1, NF2,  NTHL1, PALB2, PMS2, POLD1, POLE, PTEN, RAD51C, RAD51D, RECQL, RET, SDHA, SDHAF2, SDHB, SDHC, SDHD, SMAD4, SMARCA4, STK11, TP53, TSC1, TSC2, and VHL.  RNA data is routinely analyzed for use in variant interpretation for all genes.   06/25/2020 - 10/29/2020 Chemotherapy   TCHP x6 cycles   11/09/2020 Surgery   Bilateral mastectomies: Left mastectomy: Benign Right mastectomy: Residual microinvasive cancer status post neoadjuvant therapy, DCIS, 0/12 lymph nodes, ER +1%, PR negative, HER2 equivocal 2+ by [facility]   11/19/2020 - 07/15/2021 Chemotherapy   Patient is on Treatment Plan : BREAST Trastuzumab  + Pertuzumab q21d x 13 cycles     01/11/2021 - 03/09/2021 Radiation Therapy   Site Technique Total Dose (Gy) Dose per Fx (Gy) Completed Fx Beam Energies  Chest Wall, Right: CW_Rt 3D 50.4/50.4 1.8 28/28 10X  Chest Wall, Right: CW_Rt_SCLV 3D 50.4/50.4 1.8 28/28 6X, 10X  Chest Wall, Right: CW_Rt_Bst Electron 10/10 2 5/5 6E     06/08/2021 - 06/24/2021 Anti-estrogen oral therapy   Tamoxifen (discontinued)   05/17/2022 -  Chemotherapy   Patient is on Treatment Plan : BREAST ADO-Trastuzumab Emtansine (Kadcyla) q21d       CHIEF COMPLIANT: Follow-up of HER-2 positive right breast cancer/ after surgery     INTERVAL HISTORY: [name] is a 47 y.o. with above-mentioned history of HER-2 positive right breast cancer currently on Herceptin Perjeta maintenance. She presents to the clinic today for follow-up after surgery to discuss treatment plan. Surgery went well. She denies any pain   ALLERGIES:  is allergic to nsaids and other.  MEDICATIONS:  Current Outpatient Medications  Medication Sig Dispense Refill   acetaminophen (TYLENOL) 500 MG tablet Take 500-1,000 mg by  mouth every 6 (six) hours as needed for mild pain.     lidocaine-prilocaine (EMLA) cream Apply to affected area once 30 g 3   Multiple Vitamins-Minerals (MULTIVITAMIN WITH MINERALS) tablet Take 1 tablet by mouth daily.     ondansetron (ZOFRAN) 8  MG tablet Take 1 tablet (8 mg total) by mouth every 8 (eight) hours as needed for nausea or vomiting. 30 tablet 1   potassium chloride SA (KLOR-CON M) 20 MEQ tablet Take 1 tablet (20 mEq total) by mouth daily. 90 tablet 2   prochlorperazine (COMPAZINE) 10 MG tablet Take 1 tablet (10 mg total) by mouth every 6 (six) hours as needed for nausea or vomiting. 30 tablet 1   vedolizumab (ENTYVIO) 300 MG injection Inject into the vein.     No current facility-administered medications for this visit.    PHYSICAL EXAMINATION: ECOG PERFORMANCE STATUS: 1 - Symptomatic but completely ambulatory  Vitals:   05/12/22 1204  BP: 116/86  Pulse: 89  Resp: 17  Temp: (!) 97.3 F (36.3 C)  SpO2: 100%   Filed Weights   05/12/22 1204  Weight: 135 lb 1 oz (61.3 kg)      LABORATORY DATA:  I have reviewed the data as listed    Latest Ref Rng & Units 12/27/2021   10:29 AM 12/26/2021    8:07 AM 10/25/2021    3:41 PM  CMP  Glucose 70 - 99 mg/dL 81  81  92   BUN 6 - 23 mg/dL '13  15  11   '$ Creatinine 0.40 - 1.20 mg/dL 0.94  0.92  1.05   Sodium 135 - 145 mEq/L 139  143  139   Potassium 3.5 - 5.1 mEq/L 3.0  3.6  3.1   Chloride 96 - 112 mEq/L 101  106  99   CO2 19 - 32 mEq/L '30  28  29   '$ Calcium 8.4 - 10.5 mg/dL 9.1  9.1  9.4   Total Protein 6.5 - 8.1 g/dL  6.7  7.4   Total Bilirubin 0.3 - 1.2 mg/dL  0.8  0.6   Alkaline Phos 38 - 126 U/L  74  96   AST 15 - 41 U/L  16  16   ALT 0 - 44 U/L  15  19     Lab Results  Component Value Date   WBC 9.1 12/26/2021   HGB 10.4 (L) 12/26/2021   HCT 33.4 (L) 12/26/2021   MCV 87.0 12/26/2021   PLT 279 12/26/2021   NEUTROABS 6.0 12/26/2021    ASSESSMENT & PLAN:  Malignant neoplasm of upper-outer quadrant of right breast in female, estrogen receptor positive ([place]) 06/03/2020, US guided biopsy of the right breast 9 0 clock mass showed grade III IDC; Prognostics showed ER 40% positive, weak staining, PR positive 0 %, Her 2 3 +, Ki 67 30% 4dCN1M0 Inflammatory breast  cancer of the right breast. palpable right breast mass measuring about 7 cm x 5 and half centimeters, palpable right axillary lymph node with concern for skin invasion    Treatment Plan: 1. Neoadjuvant chemo with TCHP q 3 weeks X 6 followed by HP completed 07/07/2021 2. Bilateral mastectomies: Left mastectomy: Benign Right mastectomy: Residual microinvasive cancer status post neoadjuvant therapy, DCIS, 0/12 lymph nodes, ER +1%, PR negative, HER2 equivocal 2+ by IHC 3. Adj XRT completed 02/23/2021 4. Adj Anti-estrogen therapy with tamoxifen started 06/08/2021 discontinued 06/24/2021 (ER was only 1% therefore we felt risks outweigh benefits) 5. Neratinib: Because of her bowel  issues she decided not to take it. --------------------------------------------------------------------------------------------------------- Hospitalization 05/18/2021-06/13/2021 (small bowel obstruction) Right chest palpable nodule: 1.2 cm mass at 10 o'clock position biopsy revealed grade 2 IDC with high-grade DCIS ER 20% positive weak staining, PR 0%, HER2 3+ positive, Ki-67 50%  05/02/2022: Right lumpectomy: Recurrent grade 3 IDC, posterior margin focally involved by cancer  Treatment plan: Adjuvant Kadcyla Once again discussed the pros and cons of Kadcyla and that side effects. She will return back to see is on the first day for treatment.     No orders of the defined types were placed in this encounter.  The patient has a good understanding of the overall plan. she agrees with it. she will call with any problems that may develop before the next visit here. Total time spent: 30 mins including face to face time and time spent for planning, charting and co-ordination of care   Harriette Ohara, MD 05/12/22    I Gardiner Coins am acting as a Education administrator for [employer]  I have reviewed the above documentation for accuracy and completeness, and I agree with the above.

## 2022-05-12 NOTE — Telephone Encounter (Signed)
My Chart message sent to pt with several dates available to schedule her for an office visit.

## 2022-05-15 ENCOUNTER — Encounter: Payer: Self-pay | Admitting: *Deleted

## 2022-05-17 ENCOUNTER — Other Ambulatory Visit: Payer: Managed Care, Other (non HMO)

## 2022-05-17 ENCOUNTER — Other Ambulatory Visit: Payer: Self-pay | Admitting: *Deleted

## 2022-05-17 ENCOUNTER — Inpatient Hospital Stay: Payer: Managed Care, Other (non HMO)

## 2022-05-17 ENCOUNTER — Encounter: Payer: Self-pay | Admitting: Hematology and Oncology

## 2022-05-17 ENCOUNTER — Other Ambulatory Visit: Payer: Self-pay

## 2022-05-17 ENCOUNTER — Inpatient Hospital Stay: Payer: Managed Care, Other (non HMO) | Admitting: Hematology and Oncology

## 2022-05-17 VITALS — BP 130/90 | HR 99 | Temp 97.7°F | Resp 18 | Ht 64.0 in | Wt 133.0 lb

## 2022-05-17 VITALS — BP 109/78 | HR 100 | Temp 98.1°F | Resp 18

## 2022-05-17 DIAGNOSIS — C50411 Malignant neoplasm of upper-outer quadrant of right female breast: Secondary | ICD-10-CM

## 2022-05-17 DIAGNOSIS — Z170 Estrogen receptor positive status [ER+]: Secondary | ICD-10-CM

## 2022-05-17 DIAGNOSIS — Z5112 Encounter for antineoplastic immunotherapy: Secondary | ICD-10-CM | POA: Diagnosis not present

## 2022-05-17 DIAGNOSIS — Z5181 Encounter for therapeutic drug level monitoring: Secondary | ICD-10-CM

## 2022-05-17 DIAGNOSIS — Z95828 Presence of other vascular implants and grafts: Secondary | ICD-10-CM

## 2022-05-17 LAB — CBC WITH DIFFERENTIAL (CANCER CENTER ONLY)
Abs Immature Granulocytes: 0.01 K/uL (ref 0.00–0.07)
Basophils Absolute: 0 K/uL (ref 0.0–0.1)
Basophils Relative: 0 %
Eosinophils Absolute: 0.1 K/uL (ref 0.0–0.5)
Eosinophils Relative: 2 %
HCT: 35.7 % — ABNORMAL LOW (ref 36.0–46.0)
Hemoglobin: 11.5 g/dL — ABNORMAL LOW (ref 12.0–15.0)
Immature Granulocytes: 0 %
Lymphocytes Relative: 24 %
Lymphs Abs: 1.4 K/uL (ref 0.7–4.0)
MCH: 26.3 pg (ref 26.0–34.0)
MCHC: 32.2 g/dL (ref 30.0–36.0)
MCV: 81.5 fL (ref 80.0–100.0)
Monocytes Absolute: 0.4 K/uL (ref 0.1–1.0)
Monocytes Relative: 8 %
Neutro Abs: 3.9 K/uL (ref 1.7–7.7)
Neutrophils Relative %: 66 %
Platelet Count: 224 K/uL (ref 150–400)
RBC: 4.38 MIL/uL (ref 3.87–5.11)
RDW: 14.5 % (ref 11.5–15.5)
WBC Count: 5.8 K/uL (ref 4.0–10.5)
nRBC: 0 % (ref 0.0–0.2)

## 2022-05-17 LAB — CMP (CANCER CENTER ONLY)
ALT: 11 U/L (ref 0–44)
AST: 19 U/L (ref 15–41)
Albumin: 3.9 g/dL (ref 3.5–5.0)
Alkaline Phosphatase: 112 U/L (ref 38–126)
Anion gap: 7 (ref 5–15)
BUN: 13 mg/dL (ref 6–20)
CO2: 28 mmol/L (ref 22–32)
Calcium: 9.2 mg/dL (ref 8.9–10.3)
Chloride: 105 mmol/L (ref 98–111)
Creatinine: 0.93 mg/dL (ref 0.44–1.00)
GFR, Estimated: >60 mL/min
Glucose, Bld: 130 mg/dL — ABNORMAL HIGH (ref 70–99)
Potassium: 3.6 mmol/L (ref 3.5–5.1)
Sodium: 140 mmol/L (ref 135–145)
Total Bilirubin: 0.4 mg/dL (ref 0.3–1.2)
Total Protein: 7 g/dL (ref 6.5–8.1)

## 2022-05-17 MED ORDER — SODIUM CHLORIDE 0.9% FLUSH
10.0000 mL | INTRAVENOUS | Status: DC | PRN
  Administered 2022-05-17: 10 mL

## 2022-05-17 MED ORDER — ACETAMINOPHEN 325 MG PO TABS
650.0000 mg | ORAL_TABLET | Freq: Once | ORAL | Status: AC
  Administered 2022-05-17: 650 mg via ORAL
  Filled 2022-05-17: qty 2

## 2022-05-17 MED ORDER — PROCHLORPERAZINE MALEATE 10 MG PO TABS
10.0000 mg | ORAL_TABLET | Freq: Once | ORAL | Status: AC
  Administered 2022-05-17: 10 mg via ORAL
  Filled 2022-05-17: qty 1

## 2022-05-17 MED ORDER — SODIUM CHLORIDE 0.9 % IV SOLN
2.4000 mg/kg | Freq: Once | INTRAVENOUS | Status: AC
  Administered 2022-05-17: 160 mg via INTRAVENOUS
  Filled 2022-05-17: qty 8

## 2022-05-17 MED ORDER — HEPARIN SOD (PORK) LOCK FLUSH 100 UNIT/ML IV SOLN
500.0000 [IU] | Freq: Once | INTRAVENOUS | Status: AC | PRN
  Administered 2022-05-17: 500 [IU]

## 2022-05-17 MED ORDER — DIPHENHYDRAMINE HCL 25 MG PO CAPS
25.0000 mg | ORAL_CAPSULE | Freq: Once | ORAL | Status: AC
  Administered 2022-05-17: 25 mg via ORAL
  Filled 2022-05-17: qty 1

## 2022-05-17 MED ORDER — SODIUM CHLORIDE 0.9% FLUSH
10.0000 mL | Freq: Once | INTRAVENOUS | Status: AC
  Administered 2022-05-17: 10 mL

## 2022-05-17 MED ORDER — SODIUM CHLORIDE 0.9 % IV SOLN: Freq: Once | INTRAVENOUS | Status: AC

## 2022-05-17 MED ORDER — HEPARIN SOD (PORK) LOCK FLUSH 100 UNIT/ML IV SOLN
500.0000 [IU] | Freq: Once | INTRAVENOUS | Status: AC
  Administered 2022-05-17: 500 [IU]

## 2022-05-17 NOTE — Assessment & Plan Note (Signed)
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 palpable right breast mass measuring about 7 cm x 5 and half centimeters, palpable right axillary lymph node with concern for skin invasion    Treatment Plan: 1. Neoadjuvant chemo with TCHP q 3 weeks X 6 followed by HP completed 07/07/2021 2. Bilateral mastectomies: Left mastectomy: Benign Right mastectomy: Residual microinvasive cancer status post neoadjuvant therapy, DCIS, 0/12 lymph nodes, ER +1%, PR negative, HER2 equivocal 2+ by IHC 3. Adj XRT completed 02/23/2021 4. Adj Anti-estrogen therapy with tamoxifen started 06/08/2021 discontinued 06/24/2021 (ER was only 1% therefore we felt risks outweigh benefits) 5. Neratinib: Because of her bowel issues she decided not to take it. --------------------------------------------------------------------------------------------------------- Hospitalization 05/18/2021-06/13/2021 (small bowel obstruction) Right chest palpable nodule: 1.2 cm mass at 10 o'clock position biopsy revealed grade 2 IDC with high-grade DCIS ER 20% positive weak staining, PR 0%, HER2 3+ positive, Ki-67 50%   05/02/2022: Right lumpectomy: Recurrent grade 3 IDC, posterior margin focally involved by cancer   Treatment plan: Insurance denied Kadcyla. Recommendation: Taxol Herceptin Discussed the Pros and cons

## 2022-05-17 NOTE — Progress Notes (Signed)
Confirmed Kadcyla dose w/ Dr Lindi Adie: ok to give 160 mg (rounded dose).  Kennith Center, Pharm.D., CPP 05/17/2022'@1'$ :39 PM

## 2022-05-17 NOTE — Progress Notes (Signed)
Patient Care Team: Patient, [name] as PCP - General (General Practice) [name], RN as Oncology Nurse Navigator [name], RN as Oncology Nurse Navigator [name], MD as Consulting Physician (Colon and Rectal Surgery) [name], MD as Consulting Physician (Gastroenterology) [name], MD as Consulting Physician (General Surgery) [name], MD as Consulting Physician (Hematology and Oncology)  DIAGNOSIS:  Encounter Diagnosis  Name Primary?   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 palpable right breast mass measuring about 7 cm x 5 and half centimeters, palpable right axillary lymph node with concern for skin invasion    06/08/2020 Cancer Staging   Staging form: Breast, AJCC 8th Edition - Clinical: Stage IIIB (cT4d, cN1, cM0, G3, ER+, PR-, HER2+) - Signed by [name], MD on 07/07/2020 Histologic grading system: 3 grade system   06/24/2020 Genetic Testing   No pathogenic variants detected in [employer] [facility]RNAinsight.  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  The report date is June 24, 2020.   The CustomNext-Cancer+RNAinsight panel offered by [facility] includes sequencing and rearrangement analysis for the following 47 genes:  APC, ATM, AXIN2, BARD1, BMPR1A, BRCA1, BRCA2, BRIP1, CDH1, CDK4, CDKN2A, CHEK2, DICER1, EPCAM, GREM1, HOXB13, MEN1, MLH1, MSH2, MSH3, MSH6, MUTYH, NBN, NF1, NF2,  NTHL1, PALB2, PMS2, POLD1, POLE, PTEN, RAD51C, RAD51D, RECQL, RET, SDHA, SDHAF2, SDHB, SDHC, SDHD, SMAD4, SMARCA4, STK11, TP53, TSC1, TSC2, and VHL.  RNA data is routinely analyzed for use in variant interpretation for all genes.   06/25/2020 - 10/29/2020 Chemotherapy   TCHP x6 cycles   11/09/2020 Surgery   Bilateral mastectomies: Left mastectomy: Benign Right mastectomy: Residual microinvasive cancer status post neoadjuvant therapy, DCIS, 0/12 lymph nodes, ER +1%, PR negative, HER2 equivocal 2+ by [facility]   11/19/2020 - 07/15/2021 Chemotherapy   Patient is on Treatment Plan : BREAST Trastuzumab  + Pertuzumab q21d x 13 cycles     01/11/2021 - 03/09/2021 Radiation Therapy   Site Technique Total Dose (Gy) Dose per Fx (Gy) Completed Fx Beam Energies  Chest Wall, Right: CW_Rt 3D 50.4/50.4 1.8 28/28 10X  Chest Wall, Right: CW_Rt_SCLV 3D 50.4/50.4 1.8 28/28 6X, 10X  Chest Wall, Right: CW_Rt_Bst Electron 10/10 2 5/5 6E     06/08/2021 - 06/24/2021 Anti-estrogen oral therapy   Tamoxifen (discontinued)   05/17/2022 -  Chemotherapy   Patient is on Treatment Plan : BREAST ADO-Trastuzumab Emtansine (Kadcyla) q21d       CHIEF COMPLIANT: Discuss insurance plane  INTERVAL HISTORY: [name] is a 47 y.o. with above-mentioned history of HER-2 positive right breast cancer currently on Herceptin Perjeta maintenance. She presents to the clinic today for follow-up.      ALLERGIES:  is allergic to nsaids and other.  MEDICATIONS:  Current Outpatient Medications  Medication Sig Dispense Refill   acetaminophen (TYLENOL) 500 MG tablet Take 500-1,000 mg by mouth every 6 (six) hours as needed for mild pain.     lidocaine-prilocaine (EMLA) cream Apply to  affected area once 30 g 3   Multiple Vitamins-Minerals (MULTIVITAMIN WITH MINERALS) tablet Take 1 tablet by mouth daily.     ondansetron (ZOFRAN) 8 MG tablet Take 1 tablet (8 mg total) by mouth every 8 (eight) hours as needed for nausea or vomiting. 30 tablet 1    potassium chloride SA (KLOR-CON M) 20 MEQ tablet Take 1 tablet (20 mEq total) by mouth daily. 90 tablet 2   prochlorperazine (COMPAZINE) 10 MG tablet Take 1 tablet (10 mg total) by mouth every 6 (six) hours as needed for nausea or vomiting. 30 tablet 1   vedolizumab (ENTYVIO) 300 MG injection Inject into the vein.     No current facility-administered medications for this visit.    PHYSICAL EXAMINATION: ECOG PERFORMANCE STATUS: 1 - Symptomatic but completely ambulatory  Vitals:   05/17/22 1003  BP: (!) 130/90  Pulse: 99  Resp: 18  Temp: 97.7 F (36.5 C)  SpO2: 100%   Filed Weights   05/17/22 1003  Weight: 133 lb (60.3 kg)      LABORATORY DATA:  I have reviewed the data as listed    Latest Ref Rng & Units 12/27/2021   10:29 AM 12/26/2021    8:07 AM 10/25/2021    3:41 PM  CMP  Glucose 70 - 99 mg/dL 81  81  92   BUN 6 - 23 mg/dL '13  15  11   '$ Creatinine 0.40 - 1.20 mg/dL 0.94  0.92  1.05   Sodium 135 - 145 mEq/L 139  143  139   Potassium 3.5 - 5.1 mEq/L 3.0  3.6  3.1   Chloride 96 - 112 mEq/L 101  106  99   CO2 19 - 32 mEq/L '30  28  29   '$ Calcium 8.4 - 10.5 mg/dL 9.1  9.1  9.4   Total Protein 6.5 - 8.1 g/dL  6.7  7.4   Total Bilirubin 0.3 - 1.2 mg/dL  0.8  0.6   Alkaline Phos 38 - 126 U/L  74  96   AST 15 - 41 U/L  16  16   ALT 0 - 44 U/L  15  19     Lab Results  Component Value Date   WBC 5.8 05/17/2022   HGB 11.5 (L) 05/17/2022   HCT 35.7 (L) 05/17/2022   MCV 81.5 05/17/2022   PLT 224 05/17/2022   NEUTROABS 3.9 05/17/2022    ASSESSMENT & PLAN:  Malignant neoplasm of upper-outer quadrant of right breast in female, estrogen receptor positive ([place]) 06/03/2020, US guided biopsy of the right breast 9 0 clock mass showed grade III IDC; Prognostics showed ER 40% positive, weak staining, PR positive 0 %, Her 2 3 +, Ki 67 30% 4dCN1M0 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 palpable right breast mass measuring about 7 cm x 5 and half centimeters, palpable right  axillary lymph node with concern for skin invasion    Treatment Plan: 1. Neoadjuvant chemo with TCHP q 3 weeks X 6 followed by HP completed 07/07/2021 2. Bilateral mastectomies: Left mastectomy: Benign Right mastectomy: Residual microinvasive cancer status post neoadjuvant therapy, DCIS, 0/12 lymph nodes, ER +1%, PR negative, HER2 equivocal 2+ by IHC 3. Adj XRT completed 02/23/2021 4. Adj Anti-estrogen therapy with tamoxifen started 06/08/2021 discontinued 06/24/2021 (ER was only 1% therefore we felt risks outweigh benefits) 5. Neratinib: Because of her bowel issues she decided not to take it. --------------------------------------------------------------------------------------------------------- Hospitalization 05/18/2021-06/13/2021 (small bowel obstruction) Right chest palpable nodule: 1.2 cm mass at  10 o'clock position biopsy revealed grade 2 IDC with high-grade DCIS ER 20% positive weak staining, PR 0%, HER2 3+ positive, Ki-67 50%   05/02/2022: Right lumpectomy: Recurrent grade 3 IDC, posterior margin focally involved by cancer   Treatment plan: Insurance denied Kadcyla. Recommendation:  I discussed with the patient different options including chemo with Herceptin which is what her insurance wants [place] to do.  Patient is not interested in going through systemic chemotherapy options.  We will try to see if we can request manufacturer assistance for free drug. Patient is very frustrated and upset that this is happening.  We will call her as soon as we hear more information.    No orders of the defined types were placed in this encounter.  The patient has a good understanding of the overall plan. she agrees with it. she will call with any problems that may develop before the next visit here. Total time spent: 30 mins including face to face time and time spent for planning, charting and co-ordination of care   Harriette Ohara, MD 05/17/22    I Gardiner Coins am acting as a Education administrator for  [employer]  I have reviewed the above documentation for accuracy and completeness, and I agree with the above.

## 2022-05-17 NOTE — Progress Notes (Signed)
Per Dr Lindi Adie. Ok to reduce post observation time to 60 minutes instead of 90.

## 2022-05-17 NOTE — Progress Notes (Signed)
Per MD okay to treat today with echo from 04/20/21.  Verbal orders received to repeat echo. Appt scheduled, pt educated and verbalized understanding.

## 2022-05-18 ENCOUNTER — Telehealth: Payer: Self-pay

## 2022-05-18 NOTE — Telephone Encounter (Signed)
[name] states that she is doing fine. She is eating, drinking, and urinating well. She knows to call the office at [phone] if she has any questions or concerns.

## 2022-05-18 NOTE — Telephone Encounter (Signed)
-----   Message from Manning Charity, RN sent at 05/17/2022  1:13 PM EST ----- Regarding: Follow up First Time Kadcyla Dr. Lindi Adie 05/18/2022

## 2022-05-19 ENCOUNTER — Encounter: Payer: Self-pay | Admitting: Hematology and Oncology

## 2022-05-19 ENCOUNTER — Telehealth: Payer: Self-pay | Admitting: *Deleted

## 2022-05-19 NOTE — Telephone Encounter (Signed)
Contacted patient in response to earlier MyChart message regarding diarrhea.   [name] states diarrhea started yesterday morning. She began having loose watery stools following a meal. She denies nausea,  increased temperature or pain. She states she was trying to eat her regular diet during this time. Today she was mainly drinking water. At time of call, patient states diarrhea has almost stopped. She also said she has Crohns and a shortened colon which can make diarrhea worse.   Advised her that she can resume eating as tolerated starting with small meals of bland foods - such as crackers, toast, soups, applesauce. She said small meals could make a difference because yesterday she was eating regular sized meals. Encouraged her to take in adequate fluids, recommended water, tea, Gatorade if tolerated.  She verbalized understanding of diet recommendations.   Informed her that if diarrhea continued/increased despite change in diet or if she developed fever or pain over weekend, to call [place] [facility] and speak to After Hours Nurse Triage for advice. She said she would call if needed.

## 2022-05-23 NOTE — Assessment & Plan Note (Signed)
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 palpable right breast mass measuring about 7 cm x 5 and half centimeters, palpable right axillary lymph node with concern for skin invasion    Treatment Plan: 1. Neoadjuvant chemo with TCHP q 3 weeks X 6 followed by HP completed 07/07/2021 2. Bilateral mastectomies: Left mastectomy: Benign Right mastectomy: Residual microinvasive cancer status post neoadjuvant therapy, DCIS, 0/12 lymph nodes, ER +1%, PR negative, HER2 equivocal 2+ by IHC 3. Adj XRT completed 02/23/2021 4. Adj Anti-estrogen therapy with tamoxifen started 06/08/2021 discontinued 06/24/2021 (ER was only 1% therefore we felt risks outweigh benefits) 5. Neratinib: Because of her bowel issues she decided not to take it. --------------------------------------------------------------------------------------------------------- Hospitalization 05/18/2021-06/13/2021 (small bowel obstruction) Right chest palpable nodule: 1.2 cm mass at 10 o'clock position biopsy revealed grade 2 IDC with high-grade DCIS ER 20% positive weak staining, PR 0%, HER2 3+ positive, Ki-67 50%   05/02/2022: Right lumpectomy: Recurrent grade 3 IDC, posterior margin focally involved by cancer  Current treatment: Kadcyla cycle 1 day 8 Kadcyla toxicities: Mild intermittent diarrhea Fatigue that lasted 3 days Very mild nausea  Return to clinic 2 weeks for cycle 2

## 2022-05-23 NOTE — Progress Notes (Signed)
Patient Care Team: Patient, [name] as PCP - General (General Practice) [name], RN as Oncology Nurse Navigator [name], RN as Oncology Nurse Navigator [name], MD as Consulting Physician (Colon and Rectal Surgery) [name], MD as Consulting Physician (Gastroenterology) [name], MD as Consulting Physician (General Surgery) [name], MD as Consulting Physician (Hematology and Oncology)  DIAGNOSIS: No diagnosis found.  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 palpable right breast mass measuring about 7 cm x 5 and half centimeters, palpable right axillary lymph node with concern for skin invasion    06/08/2020 Cancer Staging   Staging form: Breast, AJCC 8th Edition - Clinical: Stage IIIB (cT4d, cN1, cM0, G3, ER+, PR-, HER2+) - Signed by [name], MD on 07/07/2020 Histologic grading system: 3 grade system   06/24/2020 Genetic Testing   No pathogenic variants detected in [facility] [employer]Can[employer] +RNAinsight.  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  The report date is June 24, 2020.   The CustomNext-Cancer+RNAinsight panel offered by [facility] includes sequencing and rearrangement analysis for the following 47 genes:  APC, ATM, AXIN2, BARD1, BMPR1A, BRCA1, BRCA2, BRIP1, CDH1, CDK4, CDKN2A, CHEK2, DICER1, EPCAM, GREM1, HOXB13, MEN1, MLH1, MSH2, MSH3, MSH6, MUTYH, NBN, NF1, NF2, NTHL1, PALB2, PMS2, POLD1, POLE, PTEN, RAD51C, RAD51D, RECQL, RET, SDHA, SDHAF2, SDHB, SDHC, SDHD, SMAD4, SMARCA4, STK11, TP53, TSC1,  TSC2, and VHL.  RNA data is routinely analyzed for use in variant interpretation for all genes.   06/25/2020 - 10/29/2020 Chemotherapy   TCHP x6 cycles   11/09/2020 Surgery   Bilateral mastectomies: Left mastectomy: Benign Right mastectomy: Residual microinvasive cancer status post neoadjuvant therapy, DCIS, 0/12 lymph nodes, ER +1%, PR negative, HER2 equivocal 2+ by [facility]   11/19/2020 - 07/15/2021 Chemotherapy   Patient is on Treatment Plan : BREAST Trastuzumab  + Pertuzumab q21d x 13 cycles     01/11/2021 - 03/09/2021 Radiation Therapy   Site Technique Total Dose (Gy) Dose per Fx (Gy) Completed Fx Beam Energies  Chest Wall, Right: CW_Rt 3D 50.4/50.4 1.8 28/28 10X  Chest Wall, Right: CW_Rt_SCLV 3D 50.4/50.4 1.8 28/28 6X, 10X  Chest Wall, Right: CW_Rt_Bst Electron 10/10 2 5/5 6E     06/08/2021 - 06/24/2021 Anti-estrogen oral therapy   Tamoxifen (discontinued)   05/17/2022 -  Chemotherapy   Patient is on Treatment Plan : BREAST ADO-Trastuzumab Emtansine (Kadcyla) q21d       CHIEF COMPLIANT:   INTERVAL HISTORY: [name] is a   ALLERGIES:  is allergic to nsaids and other.  MEDICATIONS:  Current Outpatient Medications  Medication Sig Dispense Refill   acetaminophen (TYLENOL) 500 MG tablet Take 500-1,000 mg by mouth every 6 (six) hours as needed for mild pain.     lidocaine-prilocaine (EMLA) cream Apply to affected area once 30 g 3   Multiple Vitamins-Minerals (MULTIVITAMIN WITH MINERALS) tablet Take 1 tablet by mouth daily.     ondansetron (ZOFRAN) 8 MG tablet Take 1 tablet (8 mg total) by mouth every 8 (eight) hours as needed for nausea or vomiting. 30 tablet 1  potassium chloride SA (KLOR-CON M) 20 MEQ tablet Take 1 tablet (20 mEq total) by mouth daily. 90 tablet 2   prochlorperazine (COMPAZINE) 10 MG tablet Take 1 tablet (10 mg total) by mouth every 6 (six) hours as needed for nausea or vomiting. 30 tablet 1   vedolizumab (ENTYVIO) 300 MG injection Inject into the vein.     No  current facility-administered medications for this visit.    PHYSICAL EXAMINATION: ECOG PERFORMANCE STATUS: {CHL ONC ECOG [phone]}  There were no vitals filed for this visit. There were no vitals filed for this visit.  BREAST:*** No palpable masses or nodules in either right or left breasts. No palpable axillary supraclavicular or infraclavicular adenopathy no breast tenderness or nipple discharge. (exam performed in the presence of a chaperone)  LABORATORY DATA:  I have reviewed the data as listed    Latest Ref Rng & Units 05/17/2022    9:40 AM 12/27/2021   10:29 AM 12/26/2021    8:07 AM  CMP  Glucose 70 - 99 mg/dL 130  81  81   BUN 6 - 20 mg/dL 13  13  15   $ Creatinine 0.44 - 1.00 mg/dL 0.93  0.94  0.92   Sodium 135 - 145 mmol/L 140  139  143   Potassium 3.5 - 5.1 mmol/L 3.6  3.0  3.6   Chloride 98 - 111 mmol/L 105  101  106   CO2 22 - 32 mmol/L 28  30  28   $ Calcium 8.9 - 10.3 mg/dL 9.2  9.1  9.1   Total Protein 6.5 - 8.1 g/dL 7.0   6.7   Total Bilirubin 0.3 - 1.2 mg/dL 0.4   0.8   Alkaline Phos 38 - 126 U/L 112   74   AST 15 - 41 U/L 19   16   ALT 0 - 44 U/L 11   15     Lab Results  Component Value Date   WBC 5.8 05/17/2022   HGB 11.5 (L) 05/17/2022   HCT 35.7 (L) 05/17/2022   MCV 81.5 05/17/2022   PLT 224 05/17/2022   NEUTROABS 3.9 05/17/2022    ASSESSMENT & PLAN:  No problem-specific Assessment & Plan notes found for this encounter.    No orders of the defined types were placed in this encounter.  The patient has a good understanding of the overall plan. she agrees with it. she will call with any problems that may develop before the next visit here. Total time spent: 30 mins including face to face time and time spent for planning, charting and co-ordination of care   Suzzette Righter, [place] 05/23/22    I Gardiner Coins am acting as a Education administrator for [employer]  ***

## 2022-05-24 ENCOUNTER — Inpatient Hospital Stay: Payer: Managed Care, Other (non HMO)

## 2022-05-24 ENCOUNTER — Inpatient Hospital Stay (HOSPITAL_BASED_OUTPATIENT_CLINIC_OR_DEPARTMENT_OTHER): Payer: Managed Care, Other (non HMO) | Admitting: Hematology and Oncology

## 2022-05-24 ENCOUNTER — Other Ambulatory Visit: Payer: Managed Care, Other (non HMO)

## 2022-05-24 ENCOUNTER — Ambulatory Visit (HOSPITAL_BASED_OUTPATIENT_CLINIC_OR_DEPARTMENT_OTHER)
Admission: RE | Admit: 2022-05-24 | Discharge: 2022-05-24 | Disposition: A | Discharge status: Home / Self Care | Payer: Managed Care, Other (non HMO) | Source: Ambulatory Visit | Attending: Hematology and Oncology | Admitting: Hematology and Oncology

## 2022-05-24 ENCOUNTER — Ambulatory Visit: Payer: Managed Care, Other (non HMO)

## 2022-05-24 VITALS — BP 118/82 | HR 87 | Temp 98.0°F | Resp 14 | Ht 64.0 in | Wt 131.4 lb

## 2022-05-24 DIAGNOSIS — Z5112 Encounter for antineoplastic immunotherapy: Secondary | ICD-10-CM | POA: Diagnosis not present

## 2022-05-24 DIAGNOSIS — Z5181 Encounter for therapeutic drug level monitoring: Secondary | ICD-10-CM | POA: Diagnosis not present

## 2022-05-24 DIAGNOSIS — Z0189 Encounter for other specified special examinations: Secondary | ICD-10-CM

## 2022-05-24 DIAGNOSIS — C50411 Malignant neoplasm of upper-outer quadrant of right female breast: Secondary | ICD-10-CM

## 2022-05-24 DIAGNOSIS — Z170 Estrogen receptor positive status [ER+]: Secondary | ICD-10-CM | POA: Diagnosis not present

## 2022-05-24 DIAGNOSIS — Z79899 Other long term (current) drug therapy: Secondary | ICD-10-CM

## 2022-05-24 DIAGNOSIS — Z95828 Presence of other vascular implants and grafts: Secondary | ICD-10-CM

## 2022-05-24 LAB — CBC WITH DIFFERENTIAL (CANCER CENTER ONLY)
Abs Immature Granulocytes: 0.02 10*3/uL (ref 0.00–0.07)
Basophils Absolute: 0 10*3/uL (ref 0.0–0.1)
Basophils Relative: 1 %
Eosinophils Absolute: 0.1 10*3/uL (ref 0.0–0.5)
Eosinophils Relative: 1 %
HCT: 33.9 % — ABNORMAL LOW (ref 36.0–46.0)
Hemoglobin: 11.4 g/dL — ABNORMAL LOW (ref 12.0–15.0)
Immature Granulocytes: 0 %
Lymphocytes Relative: 23 %
Lymphs Abs: 1.3 10*3/uL (ref 0.7–4.0)
MCH: 26.2 pg (ref 26.0–34.0)
MCHC: 33.6 g/dL (ref 30.0–36.0)
MCV: 77.9 fL — ABNORMAL LOW (ref 80.0–100.0)
Monocytes Absolute: 0.6 10*3/uL (ref 0.1–1.0)
Monocytes Relative: 10 %
Neutro Abs: 3.6 10*3/uL (ref 1.7–7.7)
Neutrophils Relative %: 65 %
Platelet Count: 133 10*3/uL — ABNORMAL LOW (ref 150–400)
RBC: 4.35 MIL/uL (ref 3.87–5.11)
RDW: 13.6 % (ref 11.5–15.5)
WBC Count: 5.6 10*3/uL (ref 4.0–10.5)
nRBC: 0 % (ref 0.0–0.2)

## 2022-05-24 LAB — CMP (CANCER CENTER ONLY)
ALT: 19 U/L (ref 0–44)
AST: 40 U/L (ref 15–41)
Albumin: 3.8 g/dL (ref 3.5–5.0)
Alkaline Phosphatase: 137 U/L — ABNORMAL HIGH (ref 38–126)
Anion gap: 6 (ref 5–15)
BUN: 8 mg/dL (ref 6–20)
CO2: 30 mmol/L (ref 22–32)
Calcium: 9.1 mg/dL (ref 8.9–10.3)
Chloride: 106 mmol/L (ref 98–111)
Creatinine: 0.91 mg/dL (ref 0.44–1.00)
GFR, Estimated: >60 mL/min (ref >60)
Glucose, Bld: 115 mg/dL — ABNORMAL HIGH (ref 70–99)
Potassium: 3.2 mmol/L — ABNORMAL LOW (ref 3.5–5.1)
Sodium: 142 mmol/L (ref 135–145)
Total Bilirubin: 0.5 mg/dL (ref 0.3–1.2)
Total Protein: 7.2 g/dL (ref 6.5–8.1)

## 2022-05-24 LAB — ECHOCARDIOGRAM COMPLETE
Area-P 1/2: 4.26 cm2
S' Lateral: 2.5 cm

## 2022-05-24 MED ORDER — SODIUM CHLORIDE 0.9% FLUSH
10.0000 mL | Freq: Once | INTRAVENOUS | Status: AC
  Administered 2022-05-24: 10 mL

## 2022-05-24 MED ORDER — HEPARIN SOD (PORK) LOCK FLUSH 100 UNIT/ML IV SOLN
500.0000 [IU] | Freq: Once | INTRAVENOUS | Status: AC
  Administered 2022-05-24: 500 [IU]

## 2022-05-24 NOTE — Patient Instructions (Signed)

## 2022-05-26 ENCOUNTER — Encounter: Payer: Self-pay | Admitting: Internal Medicine

## 2022-05-26 ENCOUNTER — Encounter: Payer: Self-pay | Admitting: Hematology and Oncology

## 2022-05-29 ENCOUNTER — Encounter: Payer: Self-pay | Admitting: Hematology and Oncology

## 2022-05-30 ENCOUNTER — Telehealth: Payer: Self-pay | Admitting: *Deleted

## 2022-05-30 ENCOUNTER — Encounter (HOSPITAL_COMMUNITY): Payer: Self-pay

## 2022-05-30 ENCOUNTER — Encounter: Payer: Managed Care, Other (non HMO) | Admitting: Physician Assistant

## 2022-05-30 ENCOUNTER — Other Ambulatory Visit: Payer: Self-pay | Admitting: *Deleted

## 2022-05-30 DIAGNOSIS — Z170 Estrogen receptor positive status [ER+]: Secondary | ICD-10-CM

## 2022-05-30 NOTE — Progress Notes (Incomplete)
Symptom Management Consult note [place]    Patient Care Team: Patient, [name] as PCP - General (General Practice) Courtney Kaufmann, RN as Oncology Nurse Navigator Courtney Germany, RN as Oncology Nurse Navigator Courtney Ruff, MD as Consulting Physician (Colon and Rectal Surgery) Courtney Mayer, MD as Consulting Physician (Gastroenterology) Courtney Bookbinder, MD as Consulting Physician (General Surgery) Courtney Lose, MD as Consulting Physician (Hematology and Oncology)    Name / MRN / DOB: [name]  [id number]  [birth year]   Date of visit: 05/30/2022   Chief Complaint/Reason for visit: cough   Current Therapy: Kadcyla q21d  Last treatment:  Day 1   Cycle 1 on 05/17/22   ASSESSMENT & PLAN: Patient is a 47 y.o. female  with oncologic history of malignant neoplasm of upper-outer quadrant of right breast in female, estrogen receptor positive followed by Dr. Lindi Norris.  I have viewed most recent oncology note and lab work.    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 palpable right breast mass measuring about 7 cm x 5 and half centimeters, palpable right axillary lymph node with concern for skin invasion    06/08/2020 Cancer Staging   Staging form: Breast, AJCC 8th Edition - Clinical: Stage IIIB (cT4d, cN1, cM0, G3, ER+, PR-, HER2+) - Signed by Courtney Lose, MD on 07/07/2020 Histologic grading system: 3 grade  system   06/24/2020 Genetic Testing   No pathogenic variants detected in [facility] CustomNext-Cancer +RNAinsight.  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  The report date is June 24, 2020.   The CustomNext-Cancer+RNAinsight panel offered by [facility] includes sequencing and rearrangement analysis for the following 47 genes:  APC, ATM, AXIN2, BARD1, BMPR1A, BRCA1, BRCA2, BRIP1, CDH1, CDK4, CDKN2A, CHEK2, DICER1, EPCAM, GREM1, HOXB13, MEN1, MLH1, MSH2, MSH3, MSH6, MUTYH, NBN, NF1, NF2, NTHL1, PALB2, PMS2, POLD1, POLE, PTEN, RAD51C, RAD51D, RECQL, RET, SDHA, SDHAF2, SDHB, SDHC, SDHD, SMAD4, SMARCA4, STK11, TP53, TSC1, TSC2, and VHL.  RNA data is routinely analyzed for use in variant interpretation for all genes.   06/25/2020 - 10/29/2020 Chemotherapy   TCHP x6 cycles   11/09/2020 Surgery   Bilateral mastectomies: Left mastectomy: Benign Right mastectomy: Residual microinvasive cancer status post neoadjuvant therapy, DCIS, 0/12 lymph nodes, ER +1%, PR negative, HER2 equivocal 2+ by [facility]   11/19/2020 - 07/15/2021 Chemotherapy   Patient is on Treatment Plan : BREAST Trastuzumab  + Pertuzumab q21d x 13 cycles     01/11/2021 - 03/09/2021 Radiation Therapy   Site Technique Total Dose (Gy) Dose per Fx (Gy) Completed Fx Beam Energies  Chest Wall, Right: CW_Rt 3D 50.4/50.4 1.8 28/28 10X  Chest Wall, Right: CW_Rt_SCLV 3D 50.4/50.4 1.8 28/28 6X, 10X  Chest Wall, Right: CW_Rt_Bst Electron 10/10 2 5/5 6E     06/08/2021 - 06/24/2021 Anti-estrogen oral therapy   Tamoxifen (discontinued)   05/17/2022 -  Chemotherapy   Patient is on Treatment  Plan : BREAST ADO-Trastuzumab Emtansine (Kadcyla) q21d         Interval history-: [name] is a 47 y.o. female with oncologic history as above presenting to [facility] today with chief complaint of      ROS  All other systems are reviewed and are negative for acute change except as noted in the HPI.    Allergies  Allergen  Reactions  . Nsaids Other (See Comments)    Crohns disease/ IBD  . Other     BLOOD PRODUCT REFUSAL     Past Medical History:  Diagnosis Date  . Cancer ([place])   . Colon stricture ([place]) 05/07/2019  . Crohn's colitis, with intestinal obstruction ([place]) 09/17/2013   Dx 2006 - right and left colon involved 2011 - pan-colitis 09/17/2013 left colitis 60-20 cm March 2021 subtotal colectomy ileosigmoid anastomosis for colonic strictures-Dr. Marcello Moores    . Crohn's disease ([place])   . Family history of breast cancer 06/11/2020  . 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 Rectal bleeding   . Uterine fibroid   . Vitamin D deficiency 09/24/2013     Past Surgical History:  Procedure Laterality Date  . AXILLARY LYMPH NODE DISSECTION Right 11/09/2020   Procedure: RIGHT AXILLARY LYMPH NODE DISSECTION;  Surgeon: Courtney Bookbinder, MD;  Location: [place];  Service: General;  Laterality: Right;  . BIOPSY  07/01/2020   Procedure: BIOPSY;  Surgeon: Yetta Flock, MD;  Location: [facility];  Service: Gastroenterology;;  . BIOPSY  01/27/2021   Procedure: BIOPSY;  Surgeon: Yetta Flock, MD;  Location: [facility];  Service: Gastroenterology;;  . BIOPSY  09/07/2021   Procedure: BIOPSY;  Surgeon: Jackquline Denmark, MD;  Location: [facility];  Service: Gastroenterology;;  . BREAST BIOPSY Right 03/24/2022   [place] RT BREAST BX W LOC DEV 1ST LESION IMG BX SPEC US GUIDE 03/24/2022 [facility]  . BREAST BIOPSY  04/28/2022   [place] RT RADIOACTIVE SEED LOC 04/28/2022 [facility]  . BREAST RECONSTRUCTION WITH PLACEMENT OF TISSUE EXPANDER AND FLEX HD (ACELLULAR HYDRATED DERMIS) Bilateral 11/09/2020   Procedure: BILATERAL BREAST RECONSTRUCTION WITH PLACEMENT OF TISSUE EXPANDER AND FLEX HD (ACELLULAR HYDRATED DERMIS);  Surgeon: [name], MD;  Location: [place];  Service: Plastics;  Laterality: Bilateral;  . CAPSULOTOMY  Bilateral 07/25/2021   Procedure: CAPSULOTOMY;  Surgeon: [name], MD;  Location: [place];  Service: Plastics;  Laterality: Bilateral;  . COLECTOMY  06/27/2019  . COLONOSCOPY  2015  . FLEXIBLE SIGMOIDOSCOPY N/A 07/01/2020   Procedure: FLEXIBLE SIGMOIDOSCOPY;  Surgeon: Yetta Flock, MD;  Location: [facility];  Service: Gastroenterology;  Laterality: N/A;  . FLEXIBLE SIGMOIDOSCOPY N/A 01/27/2021   Procedure: FLEXIBLE SIGMOIDOSCOPY;  Surgeon: Yetta Flock, MD;  Location: [facility];  Service: Gastroenterology;  Laterality: N/A;  . FLEXIBLE SIGMOIDOSCOPY N/A 09/07/2021   Procedure: FLEXIBLE SIGMOIDOSCOPY;  Surgeon: Jackquline Denmark, MD;  Location: [facility];  Service: Gastroenterology;  Laterality: N/A;  . NIPPLE SPARING MASTECTOMY Bilateral 11/09/2020   Procedure: BILATERAL NIPPLE SPARING MASTECTOMY;  Surgeon: Courtney Bookbinder, MD;  Location: [place];  Service: General;  Laterality: Bilateral;  . PORT-A-CATH REMOVAL N/A 07/25/2021   Procedure: REMOVAL PORT-A-CATH;  Surgeon: [name], MD;  Location: [place];  Service: Plastics;  Laterality: N/A;  . PORTACATH PLACEMENT N/A 06/23/2020   Procedure: INSERTION PORT-A-CATH;  Surgeon: Courtney Bookbinder, MD;  Location: [place];  Service:  General;  Laterality: N/A;  START TIME OF 11:00 AM FOR 13 MINUTES [place] [facility]  . PORTACATH PLACEMENT Left 05/02/2022   Procedure: INSERTION PORT-A-CATH;  Surgeon: Courtney Bookbinder, MD;  Location: [place];  Service: General;  Laterality: Left;  . RADIOACTIVE SEED GUIDED EXCISIONAL BREAST BIOPSY Right 05/02/2022   Procedure: RADIOACTIVE SEED GUIDED RIGHT BREAST MASS EXCISION;  Surgeon: Courtney Bookbinder, MD;  Location: [place];  Service: General;  Laterality: Right;  . REMOVAL OF BILATERAL TISSUE EXPANDERS WITH PLACEMENT OF BILATERAL BREAST IMPLANTS Bilateral 07/25/2021   Procedure:  REMOVAL OF BILATERAL TISSUE EXPANDERS WITH PLACEMENT OF BILATERAL BREAST IMPLANTS;  Surgeon: [name], MD;  Location: [place];  Service: Plastics;  Laterality: Bilateral;  1.5    Social History   Socioeconomic History  . Marital status: Significant Other    Spouse name: Not on file  . Number of children: 1  . Years of education: 72  . Highest education level: Not on file  Occupational History  . Occupation: Medical Coder   Tobacco Use  . Smoking status: Former    Types: Cigarettes  . Smokeless tobacco: Never  Vaping Use  . Vaping Use: Never used  Substance and Sexual Activity  . Alcohol use: Yes    Alcohol/week: 1.0 - 2.0 standard drink of alcohol    Types: 1 - 2 Glasses of wine per week  . Drug use: No  . Sexual activity: Yes    Partners: Male    Comment: Intermittent protection not consistent  Other Topics Concern  . Not on file  Social History Narrative   Single, 1 daughter born 2001 approximately   Medical coder for [employer]   She is a former smoker, occasional alcohol no drug use   Social Determinants of Radio broadcast assistant Strain: Not on file  Food Insecurity: Not on file  Transportation Needs: Not on file  Physical Activity: Not on file  Stress: Not on file  Social Connections: Not on file  Intimate Partner Violence: Not At Risk (11/25/2020)   Humiliation, Afraid, Rape, and Kick questionnaire   . Fear of Current or Ex-Partner: No   . Emotionally Abused: No   . Physically Abused: No   . Sexually Abused: No    Family History  Problem Relation Age of Onset  . Diabetes Father   . Breast cancer Maternal Aunt        dx unknown age  . Diabetes Paternal Aunt   . Breast cancer Cousin        maternal cousin; dx unknown age  . Esophageal cancer Neg Hx   . Rectal cancer Neg Hx   . Stomach cancer Neg Hx   . Colon polyps Neg Hx   . Colon cancer Neg Hx      Current Outpatient Medications:  .  acetaminophen (TYLENOL) 500 MG  tablet, Take 500-1,000 mg by mouth every 6 (six) hours as needed for mild pain., Disp: , Rfl:  .  lidocaine-prilocaine (EMLA) cream, Apply to affected area once, Disp: 30 g, Rfl: 3 .  Multiple Vitamins-Minerals (MULTIVITAMIN WITH MINERALS) tablet, Take 1 tablet by mouth daily., Disp: , Rfl:  .  ondansetron (ZOFRAN) 8 MG tablet, Take 1 tablet (8 mg total) by mouth every 8 (eight) hours as needed for nausea or vomiting., Disp: 30 tablet, Rfl: 1 .  potassium chloride SA (KLOR-CON M) 20 MEQ tablet, Take 1 tablet (20 mEq total) by mouth daily., Disp: 90 tablet, Rfl:  2 .  prochlorperazine (COMPAZINE) 10 MG tablet, Take 1 tablet (10 mg total) by mouth every 6 (six) hours as needed for nausea or vomiting., Disp: 30 tablet, Rfl: 1 .  vedolizumab (ENTYVIO) 300 MG injection, Inject into the vein., Disp: , Rfl:   PHYSICAL EXAM: ECOG FS:{CHL ONC [id number]   There were no vitals filed for this visit. Physical Exam     LABORATORY DATA: I have reviewed the data as listed    Latest Ref Rng & Units 05/24/2022    9:27 AM 05/17/2022    9:40 AM 12/26/2021    8:07 AM  CBC  WBC 4.0 - 10.5 K/uL 5.6  5.8  9.1   Hemoglobin 12.0 - 15.0 g/dL 11.4  11.5  10.4   Hematocrit 36.0 - 46.0 % 33.9  35.7  33.4   Platelets 150 - 400 K/uL 133  224  279         Latest Ref Rng & Units 05/24/2022    9:27 AM 05/17/2022    9:40 AM 12/27/2021   10:29 AM  CMP  Glucose 70 - 99 mg/dL 115  130  81   BUN 6 - 20 mg/dL 8  13  13   $ Creatinine 0.44 - 1.00 mg/dL 0.91  0.93  0.94   Sodium 135 - 145 mmol/L 142  140  139   Potassium 3.5 - 5.1 mmol/L 3.2  3.6  3.0   Chloride 98 - 111 mmol/L 106  105  101   CO2 22 - 32 mmol/L 30  28  30   $ Calcium 8.9 - 10.3 mg/dL 9.1  9.2  9.1   Total Protein 6.5 - 8.1 g/dL 7.2  7.0    Total Bilirubin 0.3 - 1.2 mg/dL 0.5  0.4    Alkaline Phos 38 - 126 U/L 137  112    AST 15 - 41 U/L 40  19    ALT 0 - 44 U/L 19  11         RADIOGRAPHIC STUDIES (from last 24 hours if applicable) I have  personally reviewed the radiological images as listed and agreed with the findings in the report. No results found.      Visit Diagnosis: No diagnosis found.   No orders of the defined types were placed in this encounter.   All questions were answered. The patient knows to call the clinic with any problems, questions or concerns. No barriers to learning was detected.  I have spent a total of *** minutes minutes of face-to-face and non-face-to-face time, preparing to see the patient, obtaining and/or reviewing separately obtained history, performing a medically appropriate examination, counseling and educating the patient, ordering tests, documenting clinical information in the electronic health record, and care coordination (communications with other health care professionals or caregivers).    Thank you for allowing me to participate in the care of this patient.    Barrie Folk, PA-C Department of Hematology/Oncology [facility] at [facility] Phone: [phone]  [phone]    05/30/2022 9:16 AM

## 2022-05-30 NOTE — Telephone Encounter (Signed)
Received call from pt requesting to cancel [facility] visit for today.  Pt states she is unable to get off work and has not coughed at all today.  Pt states she will alert our office if symptoms resume for [facility] visit.

## 2022-05-30 NOTE — Progress Notes (Signed)
Received message from pt with complaint of new could since starting Kadcyla.  Per MD pt needing to be seen in [facility].  Verbal orders received from [facility] to obtain STAT chest xray and lab work prior to visit.  Appts scheduled, attempt x1 to notify pt of appts.  No answer, left detailed VM.

## 2022-06-06 ENCOUNTER — Ambulatory Visit: Payer: Managed Care, Other (non HMO) | Attending: Adult Health

## 2022-06-06 DIAGNOSIS — I890 Lymphedema, not elsewhere classified: Secondary | ICD-10-CM | POA: Insufficient documentation

## 2022-06-06 DIAGNOSIS — R293 Abnormal posture: Secondary | ICD-10-CM | POA: Insufficient documentation

## 2022-06-06 DIAGNOSIS — Z170 Estrogen receptor positive status [ER+]: Secondary | ICD-10-CM | POA: Diagnosis present

## 2022-06-06 DIAGNOSIS — M79644 Pain in right finger(s): Secondary | ICD-10-CM

## 2022-06-06 DIAGNOSIS — C50411 Malignant neoplasm of upper-outer quadrant of right female breast: Secondary | ICD-10-CM | POA: Diagnosis present

## 2022-06-06 NOTE — Therapy (Signed)
OUTPATIENT PHYSICAL THERAPY ONCOLOGY TREATMENT  Patient Name: [name] MRN: [id number] DOB:1975-10-23, 47 y.o., female Today's Date: 06/06/2022  END OF SESSION:  PT End of Session - 06/06/22 1602     Visit Number 5    Number of Visits 7    Date for PT Re-Evaluation 06/21/22    PT Start Time 1603    PT Stop Time [id number]    PT Time Calculation (min) 52 min    Activity Tolerance Patient tolerated treatment well    Behavior During Therapy [facility] for tasks assessed/performed              Past Medical History:  Diagnosis Date   Cancer ([place])    Colon stricture ([place]) 05/07/2019   Crohn's colitis, with intestinal obstruction ([place]) 09/17/2013   Dx 2006 - right and left colon involved 2011 - pan-colitis 09/17/2013 left colitis 60-20 cm March 2021 subtotal colectomy ileosigmoid anastomosis for colonic strictures-Dr. Marcello Moores     Crohn's disease [facility])    Family history of breast cancer 06/11/2020   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 10/17/2010   Rectal bleeding    Uterine fibroid    Vitamin D deficiency 09/24/2013   Past Surgical History:  Procedure Laterality Date   AXILLARY LYMPH NODE DISSECTION Right 11/09/2020   Procedure: RIGHT AXILLARY LYMPH NODE DISSECTION;  Surgeon: [name], MD;  Location: [place];  Service: General;  Laterality: Right;   BIOPSY  07/01/2020   Procedure: BIOPSY;  Surgeon: Yetta Flock, MD;  [facility];  Service: Gastroenterology;;   BIOPSY  01/27/2021   Procedure: BIOPSY;  Surgeon: Yetta Flock, MD;  [facility];  Service: Gastroenterology;;   BIOPSY  09/07/2021   Procedure: BIOPSY;  Surgeon: Jackquline Denmark, MD;  [facility];  Service: Gastroenterology;;   BREAST BIOPSY Right 03/24/2022   [place] RT BREAST BX W LOC DEV 1ST LESION IMG BX [place] US GUIDE 03/24/2022 [facility]GRAP[facility]   BREAST BIOPSY  04/28/2022   [place] RT RADIOACTIVE SEED LOC 04/28/2022  [facility] MAMMOGRAPHY   BREAST RECONSTRUCTION WITH PLACEMENT OF TISSUE EXPANDER AND FLEX HD (ACELLULAR HYDRATED DERMIS) Bilateral 11/09/2020   Procedure: BILATERAL BREAST RECONSTRUCTION WITH PLACEMENT OF TISSUE EXPANDER AND FLEX HD (ACELLULAR HYDRATED DERMIS);  Surgeon: [name], MD;  Location: [place];  Service: Plastics;  Laterality: Bilateral;   CAPSULOTOMY Bilateral 07/25/2021   Procedure: CAPSULOTOMY;  Surgeon: [name], MD;  Location: [place];  Service: Plastics;  Laterality: Bilateral;   COLECTOMY  06/27/2019   COLONOSCOPY  2015   FLEXIBLE SIGMOIDOSCOPY N/A 07/01/2020   Procedure: FLEXIBLE SIGMOIDOSCOPY;  Surgeon: Yetta Flock, MD;  [facility];  Service: Gastroenterology;  Laterality: N/A;   FLEXIBLE SIGMOIDOSCOPY N/A 01/27/2021   Procedure: FLEXIBLE SIGMOIDOSCOPY;  Surgeon: Yetta Flock, MD;  [facility];  Service: Gastroenterology;  Laterality: N/A;   FLEXIBLE SIGMOIDOSCOPY N/A 09/07/2021   Procedure: FLEXIBLE SIGMOIDOSCOPY;  Surgeon: Jackquline Denmark, MD;  [facility];  Service: Gastroenterology;  Laterality: N/A;   NIPPLE SPARING MASTECTOMY Bilateral 11/09/2020   Procedure: BILATERAL NIPPLE SPARING MASTECTOMY;  Surgeon: [name], MD;  Location: [place];  Service: General;  Laterality: Bilateral;   PORT-A-CATH REMOVAL N/A 07/25/2021   Procedure: REMOVAL PORT-A-CATH;  Surgeon: [name], MD;  Location: [place];  Service: Plastics;  Laterality: N/A;   PORTACATH PLACEMENT N/A 06/23/2020   Procedure: INSERTION PORT-A-CATH;  Surgeon:  [name], MD;  Location: [place];  Service: General;  Laterality: N/A;  START TIME OF 11:00 AM FOR 60 MINUTES [place] [facility]   PORTACATH PLACEMENT Left 05/02/2022   Procedure: INSERTION PORT-A-CATH;  Surgeon: [name], MD;  Location: [place];  Service: General;  Laterality:  Left;   RADIOACTIVE SEED GUIDED EXCISIONAL BREAST BIOPSY Right 05/02/2022   Procedure: RADIOACTIVE SEED GUIDED RIGHT BREAST MASS EXCISION;  Surgeon: [name], MD;  Location: [place];  Service: General;  Laterality: Right;   REMOVAL OF BILATERAL TISSUE EXPANDERS WITH PLACEMENT OF BILATERAL BREAST IMPLANTS Bilateral 07/25/2021   Procedure: REMOVAL OF BILATERAL TISSUE EXPANDERS WITH PLACEMENT OF BILATERAL BREAST IMPLANTS;  Surgeon: [name], MD;  Location: [place];  Service: Plastics;  Laterality: Bilateral;  1.5   Patient Active Problem List   Diagnosis Date Noted   Leucocytosis 05/27/2021   Crohn's disease of small and large intestines with complication ([place])    SBO (small bowel obstruction) ([place]) 01/26/2021   History of Crohn's disease    Breast cancer ([place]) 11/09/2020   Port-A-Cath in place 07/15/2020   Genetic testing 06/25/2020   Family history of breast cancer 06/11/2020   Malignant neoplasm of upper-outer quadrant of right breast in female, estrogen receptor positive ([place]) 06/04/2020   Abnormal finding present on diagnostic imaging of uterus 05/07/2019   Vitamin D deficiency 09/24/2013   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 10/17/2010    PCP: Does not have one  REFERRING PROVIDER: Wilber Bihari, NP  REFERRING DIAG: C50.911 (ICD-10-CM) - Recurrent breast cancer, right ([place])  THERAPY DIAG:  Lymphedema, not elsewhere classified  Pain of right thumb  Abnormal posture  Carcinoma of upper-outer quadrant of right breast in female, estrogen receptor positive ([place])  ONSET DATE: 01/16/22  Rationale for Evaluation and Treatment: Rehabilitation  SUBJECTIVE:                                                                                                                                                                                           SUBJECTIVE STATEMENT: I am wearing the sleeve and  glove all day and wrapping at night. I took the sleeve and glove off about 45 min ago.. Sometimes I wear a guantlet over the glove and that keeps my hand down better. I do still get some pain in the thumb tendons. I am doing the MLD every other day. I have some trouble with the wrapping, but I think I do the MLD correctly.  PERTINENT HISTORY:  Patient was diagnosed on 06/04/2020 with right grade III invasive  ductal carcinoma breast cancer. She neoadjuvant chemotherapy from 06/25/2020 - 10/29/2020 followed by bilateral mastectomies with expanders placed on 11/09/2020 with 0/12 axillary nodes positive for cancer. It is ER positive, PR negative, and HER2 negative with a Ki67 of 40%. 03/28/22- diagnosed with R recurrence and will undergo a lumpectomy PAIN:  Are you having pain? Yes NPRS scale: 3/10 when moving R thumb no pain in sitting Pain location: R thumb Pain orientation: Right  PAIN TYPE: sharp Pain description: intermittent  Aggravating factors: moving the thumb, lifting Relieving factors: not doing anything  PRECAUTIONS: Other: active cancer - recurrent R breast cancer  WEIGHT BEARING RESTRICTIONS: No  FALLS:  Has patient fallen in last 6 months? No  LIVING ENVIRONMENT: Lives with: lives with their daughter and grandson who is 11 months old Lives in: House/apartment Stairs: No;  Has following equipment at home: None  OCCUPATION: full time - Careers information officer  LEISURE: walks, lifts weights 5lbs, squats 2x/wk  HAND DOMINANCE: right   PRIOR LEVEL OF FUNCTION: Independent  PATIENT GOALS: to get the swelling down and the pain to stop   OBJECTIVE:  COGNITION: Overall cognitive status: Within functional limits for tasks assessed   PALPATION: Tenderness along thumb extending to wrist  OBSERVATIONS / OTHER ASSESSMENTS: R hand is visibly swollen  POSTURE: forward head, rounded shoulders  UPPER EXTREMITY AROM/PROM: WFL   LYMPHEDEMA ASSESSMENTS:   SURGERY TYPE/DATE: bilateral  mastectomy 11/09/20 and will need a lumpectomy for recent recurrence on R side  NUMBER OF LYMPH NODES REMOVED: 0/12  CHEMOTHERAPY: completed  RADIATION:completed 2022  LYMPHEDEMA ASSESSMENTS:   LANDMARK RIGHT  eval RIGHT 04/20/2022 RIGHT 05/10/22 RIGHT 06/06/2022  10 cm proximal to olecranon process 25.4 26.7  26.5  Olecranon process 22.2 23.2  22.7  10 cm proximal to ulnar styloid process 18.4 19.6  19.5  Just proximal to ulnar styloid process 15.3 15.1 15.3 15.3  Across hand at thumb web space 19.2 19.5 19.1 18.9  At base of 2nd digit 6.5 6.6 6 6.1  (Blank rows = not tested)  LANDMARK LEFT  eval  10 cm proximal to olecranon process 25.5  Olecranon process 23  10 cm proximal to ulnar styloid process 18.4  Just proximal to ulnar styloid process 14.5  Across hand at thumb web space 17.9  At base of 2nd digit 5.7  (Blank rows = not tested)    L-DEX LYMPHEDEMA SCREENING:  The patient was assessed using the L-Dex machine today to produce a lymphedema index baseline score. The patient will be reassessed on a regular basis (typically every 3 months) to obtain new L-Dex scores. If the score is > 6.5 points away from his/her baseline score indicating onset of subclinical lymphedema, it will be recommended to wear a compression garment for 4 weeks, 12 hours per day and then be reassessed. If the score continues to be > 6.5 points from baseline at reassessment, we will initiate lymphedema treatment. Assessing in this manner has a 95% rate of preventing clinically significant lymphedema.      QUICK DASH SURVEY:         LLIS: 23.53  TODAY'S TREATMENT:  DATE:   06/06/2022 Pt re- measured Instructed pt in compression bandaging to fingers x 2 with therapist demonstrating and pt then wrapping using counter for assistance Also instructed in hand and  arm wrap x 1 ea  with emphasis on keeping fingers apart when wrapping hand so she doesn't get it too tight. Practiced hand and arm again using table for assistance. Showed pt circaid night garment which she may be interested in purchasing as she thinks she has met her in network out of pocket. Pt gave permission to send  to [place] Discussed using her wrist splint when she is home from work and having to lift her 28 month old grandson as that is a likely stressor for her wrist pain. Reviewed how to perform MLD across the chest and avoiding her portacath.;she verbalized understanding   05/10/22:  Educated pt how to apply compression bandaging from wrist to axilla. She was previously instructed in how to apply hand and finger bandages. Cut Komprex II foam and 1/2 grey foam to alternate using on back of hand with bandaging to see if that will help decrease edema at back of hand. Educated pt that she can don Komprex II inside her glove on the dorsum of hand. Remeasured l dex today and pt has increased since last session. At this point she most likely has transitioned from subclinical lymphedema to lymphedema. Educated pt to continue to wear her sleeve during the day and to begin bandaging hand and arm every night for the next 2 weeks and then will reassess at next session and answer any questions she has. She has been wearing her glove and demonstrates great reduction in finger edema but still has edema at dorsum of hand.   04/26/2022 Reviewed self MLD with pt in sitting to the right UE with VC's for correct pressure and sequence, Short neck, 5 diaphragmatic breaths, activate left axillary and right inguinal LN's, Anterior interaxillary pathway, right axillo-inguinal pathway, Right outer arm, inner to outer arm, outer arm then retracing steps then forearm retracing steps and hand retracing steps and ending with LN's. Pt required occasional VC's and TC's but did very well overall. Discussed HSA and if pt had  enough that she could use to cover her sleeve and glove.  She had enough money so she was given info to order [employer] and glove in 30-40 compression. Pt applied her sleeve and therapist started instructing pt in finger wrap and PT performed once and then pt performed. She struggled initially with keeping wrap straight but improved as she did more. She used the counter for assist. She had to leave at 4:45 to get her grandchild from daycare so I applied hand wrap with instruction to pt. I gave her video instructions for bandaging. Showed her how to check capillary refill and instructed to remove if wrap is ever painful or causes numbness.  She knows she must remove sleeve and wrap before going to bed and reapply in am.Pt verbalized understanding.   04/20/2022 Pt shown how to remove brace and range her wrist so it doesn't get tight, and advised not to wear at night to see if finger/hand swelling will improve and not to get wrist area on brace too tight. Measured arm for therapy note and for sleeve and glove fit. She measured into a Size 1 Medi Harmony sleeve and size 2  Harmony glove. I had her try on the harmony sleeve here and it fit well. We discussed insurance vs paying outright.  She will be a full pay for sleeve right now since she hasn't met her out of pocket. Cost with compression guru is nearly $200.00. She would like to wait until after her surgery to order her sleeve, and would like to try wrapping her hand while she wears the sleeve she has. Advised we could try that next visit. Had pt sit in a chair so she could observe me and instructed in self MLD to the right UE with VC's for correct pressure and sequence, Short neck, 5 diaphragmatic breaths, activate left axillary and right inguinal LN's, Anterior interaxillary pathway, right axillo-inguinal pathway, Right outer arm, inner to outer arm, outer arm then retracing steps then forearm retracing steps and hand retracing steps and ending with  LN's. Pt was given a handout for home use      04/07/22: Educated pt to purchase a brace for her wrist and thumb to immobilize her thumb to allow it to heal. Issued 3 exercises to help decrease pain along tendons of R thumb. Educated her to wear the brace during daytime hours for about a week and to avoid twisting motions and lifting. Also educated pt that she may need a custom compression sleeve but will assess for this at next visit.   PATIENT EDUCATION:  Education details: tenosynovitis and causes, need for wrist splint, exercises to help with thumb pain, lymphedema vs subclinical lymphedema Person educated: Patient Education method: Explanation, Demonstration, and Handouts Education comprehension: verbalized understanding and returned demonstration  HOME EXERCISE PROGRAM: , Thumb lifts, Place your hand on a flat surface, with your palm up. Lift your thumb away from your palm to make a "C" shape. Hold for about 6 seconds. Repeat 8 to 12 times. Passive thumb MP flexion, Hold your hand in front of you, and turn your hand so your little finger faces down and your thumb faces up. (Your hand should be in the position used for shaking someone's hand.) You may also rest your hand on a flat surface. Use the fingers on your other hand to bend your thumb down at the point where your thumb connects to your palm. Hold for at least 15 to 30 seconds. Repeat 2 to 4 times. Finkelstein stretch, Hold your arms out in front of you. (Your hand should be in the position used for shaking someone's hand.) Bend your thumb toward your palm. Use your other hand to gently stretch your thumb and wrist downward until you feel a stretch on the thumb side of your wrist. Hold for at least 15 to 30 seconds. Repeat 2 to 4 times. It's a good idea to repeat these steps with your other wrist.       The patient was assessed using the L-Dex machine today to produce a lymphedema index baseline score. The patient  will be reassessed on a regular basis (typically every 3 months) to obtain new L-Dex scores. If the score is > 6.5 points away from his/her baseline score indicating onset of subclinical lymphedema, it will be recommended to wear a compression garment for 4 weeks, 12 hours per day and then be reassessed. If the score continues to be > 6.5 points from baseline at reassessment, we will initiate lymphedema treatment. Assessing in this manner has a 95% rate of preventing clinically significant lymphedema.   ASSESSMENT:  CLINICAL IMPRESSION: Pt continues with swelling at dorsum of hand although pt indicated it is better when using comprex pad under glove or when she uses a gauntlet over her glove. We  practiced bandaging again today and she required VC and assist initially, but then able to correct and did well when using the counter to assist. Pt gave approval to send demo sheet to [place] and will do so tomorrow. She is interested in getting a night garment and was shown the Circaid night today which she really liked. She is still having some wrist pain today and was advised to try her splint when lifting her grandchild.  OBJECTIVE IMPAIRMENTS: decreased knowledge of condition, decreased knowledge of use of DME, increased edema, increased fascial restrictions, impaired UE functional use, and pain.   ACTIVITY LIMITATIONS: carrying, lifting, and caring for others  PARTICIPATION LIMITATIONS: meal prep, cleaning, and occupation  PERSONAL FACTORS: Time since onset of injury/illness/exacerbation are also affecting patient's functional outcome.   REHAB POTENTIAL: Good  CLINICAL DECISION MAKING: Stable/uncomplicated  EVALUATION COMPLEXITY: Low  GOALS: Goals reviewed with patient? Yes  LONG TERM GOALS=SHORT TERM GOALS Target date: 05/05/22  Pt will report no pain in thumb and wrist when moving her thumb.  Baseline:  Goal status: PARTIALLY MET 50% improved on 05/10/22  2.  Pt will obtain an  appropriate compression sleeve and glove for long term management of lymphedema.  Baseline:  Goal status: MET 05/10/22  4.  Pt will be independent in self MLD for long term management of lymphedema  Baseline:  Goal status: MET 05/10/22  5.  Pt will be independent in a home exercise program to decrease thumb and wrist pain on R side.  Baseline:  Goal status: MET 05/10/22  6.  Pt will demonstrate a 0.5 decrease in edema at dorsum of hand to allow improved comfort.  Baseline:  Goal status: IN PROGRESS  PLAN:  PT FREQUENCY: every other week  PT DURATION: 6 weeks  PLANNED INTERVENTIONS: Therapeutic exercises, Therapeutic activity, Patient/Family education, Self Care, Joint mobilization, Orthotic/Fit training, DME instructions, Manual lymph drainage, Compression bandaging, scar mobilization, Taping, Manual therapy, and Re-evaluation  PLAN FOR NEXT SESSION: how did bandaging go?, review MLD to RUE prn, review bandaging prn   Claris Pong, PT 06/06/2022, 5:20 PM  [facility] [address] [place], [place], [address] Phone: [phone]   Fax:  [phone]

## 2022-06-08 ENCOUNTER — Ambulatory Visit: Payer: Managed Care, Other (non HMO)

## 2022-06-08 ENCOUNTER — Ambulatory Visit: Payer: Managed Care, Other (non HMO) | Admitting: Hematology and Oncology

## 2022-06-08 ENCOUNTER — Other Ambulatory Visit: Payer: Managed Care, Other (non HMO)

## 2022-06-09 ENCOUNTER — Inpatient Hospital Stay: Payer: Managed Care, Other (non HMO)

## 2022-06-09 ENCOUNTER — Inpatient Hospital Stay: Payer: Managed Care, Other (non HMO) | Attending: Hematology and Oncology

## 2022-06-09 ENCOUNTER — Telehealth: Payer: Self-pay | Admitting: *Deleted

## 2022-06-09 ENCOUNTER — Inpatient Hospital Stay (HOSPITAL_BASED_OUTPATIENT_CLINIC_OR_DEPARTMENT_OTHER): Payer: Managed Care, Other (non HMO) | Admitting: Adult Health

## 2022-06-09 ENCOUNTER — Encounter: Payer: Self-pay | Admitting: Adult Health

## 2022-06-09 DIAGNOSIS — C50411 Malignant neoplasm of upper-outer quadrant of right female breast: Secondary | ICD-10-CM

## 2022-06-09 DIAGNOSIS — D6481 Anemia due to antineoplastic chemotherapy: Secondary | ICD-10-CM | POA: Diagnosis not present

## 2022-06-09 DIAGNOSIS — K50812 Crohn's disease of both small and large intestine with intestinal obstruction: Secondary | ICD-10-CM | POA: Insufficient documentation

## 2022-06-09 DIAGNOSIS — R197 Diarrhea, unspecified: Secondary | ICD-10-CM | POA: Insufficient documentation

## 2022-06-09 DIAGNOSIS — Z170 Estrogen receptor positive status [ER+]: Secondary | ICD-10-CM | POA: Insufficient documentation

## 2022-06-09 DIAGNOSIS — E876 Hypokalemia: Secondary | ICD-10-CM | POA: Insufficient documentation

## 2022-06-09 DIAGNOSIS — Z5112 Encounter for antineoplastic immunotherapy: Secondary | ICD-10-CM | POA: Diagnosis present

## 2022-06-09 DIAGNOSIS — Z95828 Presence of other vascular implants and grafts: Secondary | ICD-10-CM

## 2022-06-09 LAB — CBC WITH DIFFERENTIAL (CANCER CENTER ONLY)
Abs Immature Granulocytes: 0.01 10*3/uL (ref 0.00–0.07)
Basophils Absolute: 0 10*3/uL (ref 0.0–0.1)
Basophils Relative: 0 %
Eosinophils Absolute: 0.1 10*3/uL (ref 0.0–0.5)
Eosinophils Relative: 1 %
HCT: 31.8 % — ABNORMAL LOW (ref 36.0–46.0)
Hemoglobin: 10.5 g/dL — ABNORMAL LOW (ref 12.0–15.0)
Immature Granulocytes: 0 %
Lymphocytes Relative: 23 %
Lymphs Abs: 1.5 10*3/uL (ref 0.7–4.0)
MCH: 26.5 pg (ref 26.0–34.0)
MCHC: 33 g/dL (ref 30.0–36.0)
MCV: 80.3 fL (ref 80.0–100.0)
Monocytes Absolute: 0.7 10*3/uL (ref 0.1–1.0)
Monocytes Relative: 10 %
Neutro Abs: 4.5 10*3/uL (ref 1.7–7.7)
Neutrophils Relative %: 66 %
Platelet Count: 263 10*3/uL (ref 150–400)
RBC: 3.96 MIL/uL (ref 3.87–5.11)
RDW: 15.1 % (ref 11.5–15.5)
WBC Count: 6.8 10*3/uL (ref 4.0–10.5)
nRBC: 0 % (ref 0.0–0.2)

## 2022-06-09 LAB — CMP (CANCER CENTER ONLY)
ALT: 17 U/L (ref 0–44)
AST: 27 U/L (ref 15–41)
Albumin: 3.8 g/dL (ref 3.5–5.0)
Alkaline Phosphatase: 104 U/L (ref 38–126)
Anion gap: 6 (ref 5–15)
BUN: 11 mg/dL (ref 6–20)
CO2: 26 mmol/L (ref 22–32)
Calcium: 8.9 mg/dL (ref 8.9–10.3)
Chloride: 105 mmol/L (ref 98–111)
Creatinine: 0.92 mg/dL (ref 0.44–1.00)
GFR, Estimated: >60 mL/min (ref >60)
Glucose, Bld: 113 mg/dL — ABNORMAL HIGH (ref 70–99)
Potassium: 3.1 mmol/L — ABNORMAL LOW (ref 3.5–5.1)
Sodium: 137 mmol/L (ref 135–145)
Total Bilirubin: 0.9 mg/dL (ref 0.3–1.2)
Total Protein: 7.3 g/dL (ref 6.5–8.1)

## 2022-06-09 MED ORDER — SODIUM CHLORIDE 0.9 % IV SOLN: Freq: Once | INTRAVENOUS | Status: AC

## 2022-06-09 MED ORDER — DIPHENHYDRAMINE HCL 25 MG PO CAPS
25.0000 mg | ORAL_CAPSULE | Freq: Once | ORAL | Status: AC
  Administered 2022-06-09: 25 mg via ORAL
  Filled 2022-06-09: qty 1

## 2022-06-09 MED ORDER — SODIUM CHLORIDE 0.9% FLUSH
10.0000 mL | INTRAVENOUS | Status: DC | PRN
  Administered 2022-06-09: 10 mL

## 2022-06-09 MED ORDER — HEPARIN SOD (PORK) LOCK FLUSH 100 UNIT/ML IV SOLN
500.0000 [IU] | Freq: Once | INTRAVENOUS | Status: AC | PRN
  Administered 2022-06-09: 500 [IU]

## 2022-06-09 MED ORDER — PROCHLORPERAZINE MALEATE 10 MG PO TABS
10.0000 mg | ORAL_TABLET | Freq: Once | ORAL | Status: AC
  Administered 2022-06-09: 10 mg via ORAL
  Filled 2022-06-09: qty 1

## 2022-06-09 MED ORDER — SODIUM CHLORIDE 0.9% FLUSH
10.0000 mL | Freq: Once | INTRAVENOUS | Status: AC
  Administered 2022-06-09: 10 mL

## 2022-06-09 MED ORDER — ACETAMINOPHEN 325 MG PO TABS
650.0000 mg | ORAL_TABLET | Freq: Once | ORAL | Status: AC
  Administered 2022-06-09: 650 mg via ORAL
  Filled 2022-06-09: qty 2

## 2022-06-09 MED ORDER — SODIUM CHLORIDE 0.9 % IV SOLN
2.4000 mg/kg | Freq: Once | INTRAVENOUS | Status: AC
  Administered 2022-06-09: 160 mg via INTRAVENOUS
  Filled 2022-06-09: qty 8

## 2022-06-09 NOTE — Assessment & Plan Note (Signed)
[name] is a 47 year old woman with recurrent HER2 positive breast cancer status postlumpectomy here today for follow-up and evaluation prior to receiving her second cycle of Kadcyla.  Her labs today are stable and I reviewed them with her in detail.  She will proceed with treatment today.  She has Imodium on hand if she develops any more diarrhea.  She also can drink protein shakes with carbohydrates.  We will see her back in 3 weeks for labs, follow-up, and her next appointment.

## 2022-06-09 NOTE — Telephone Encounter (Signed)
-----   Message from Gardenia Phlegm, NP sent at 06/09/2022  2:57 PM EST ----- Potassium decreased, please recommend she increase potassium rich foods  ----- Message ----- From: Buel Ream, [facility] [place] Sent: 06/09/2022  10:19 AM EST To: [name], MD

## 2022-06-09 NOTE — Progress Notes (Signed)
[facility] Follow up:    Patient, [name] No address on file   DIAGNOSIS:  Cancer Staging  Malignant neoplasm of upper-outer quadrant of right breast in female, estrogen receptor positive ([place]) Staging form: Breast, AJCC 8th Edition - Clinical: Stage IIIB (cT4d, cN1, cM0, G3, ER+, PR-, HER2+) - Signed by [name], MD on 07/07/2020 Histologic grading system: 3 grade system   SUMMARY OF ONCOLOGIC HISTORY: Oncology History  Malignant neoplasm of upper-outer quadrant of right breast in female, estrogen receptor positive ([place])  06/03/2020 Mammogram   06/03/2020, US guided biopsy of the right breast 9 0 clock mass showed grade III IDC; Prognostics showed ER 40% positive, weak staining, PR positive 0 %, Her 2 3 +, Ki 67 30%   06/04/2020 Initial Diagnosis   [id number] 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 palpable right breast mass measuring about 7 cm x 5 and half centimeters, palpable right axillary lymph node with concern for skin invasion    06/08/2020 Cancer Staging   Staging form: Breast, AJCC 8th Edition - Clinical: Stage IIIB (cT4d, cN1, cM0, G3, ER+, PR-, HER2+) - Signed by [name], MD on 07/07/2020 Histologic grading system: 3 grade system   06/24/2020 Genetic Testing   No pathogenic variants detected in [facility]RNA[facility]ight.  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  The report date is June 24, 2020.   The CustomNext-Cancer+RNAinsight panel offered by [facility] includes sequencing and rearrangement analysis for the following 47 genes:  APC, ATM, AXIN2, BARD1, BMPR1A, BRCA1, BRCA2, BRIP1, CDH1, CDK4, CDKN2A, CHEK2, DICER1, EPCAM, GREM1, HOXB13, MEN1, MLH1, MSH2, MSH3, MSH6, MUTYH, NBN, NF1, NF2, NTHL1, PALB2, PMS2, POLD1, POLE, PTEN, RAD51C, RAD51D, RECQL, RET, SDHA, SDHAF2, SDHB, SDHC, SDHD, SMAD4, SMARCA4, STK11, TP53, TSC1, TSC2, and VHL.  RNA data is routinely analyzed for use in variant  interpretation for all genes.   06/25/2020 - 10/29/2020 Chemotherapy   TCHP x6 cycles   11/09/2020 Surgery   Bilateral mastectomies: Left mastectomy: Benign Right mastectomy: Residual microinvasive cancer status post neoadjuvant therapy, DCIS, 0/12 lymph nodes, ER +1%, PR negative, HER2 equivocal 2+ by [facility]   11/19/2020 - 07/15/2021 Chemotherapy   Patient is on Treatment Plan : BREAST Trastuzumab  + Pertuzumab q21d x 13 cycles     01/11/2021 - 03/09/2021 Radiation Therapy   Site Technique Total Dose (Gy) Dose per Fx (Gy) Completed Fx Beam Energies  Chest Wall, Right: CW_Rt 3D 50.4/50.4 1.8 28/28 10X  Chest Wall, Right: CW_Rt_SCLV 3D 50.4/50.4 1.8 28/28 6X, 10X  Chest Wall, Right: CW_Rt_Bst Electron 10/10 2 5/5 6E     06/08/2021 - 06/24/2021 Anti-estrogen oral therapy   Tamoxifen (discontinued)   03/24/2022 Relapse/Recurrence   Right chest palpable nodule: 1.2 cm mass at 10 o'clock position biopsy revealed grade 2 IDC with high-grade DCIS ER 20% positive weak staining, PR 0%, HER2 3+ positive, Ki-67 50%   04/06/2022 Imaging   CT chest/abd/pelvis  IMPRESSION: 1. No evidence of metastatic disease in the chest, abdomen or pelvis. 2. Similar probable postradiation change in the peripheral anterior right upper lobe, anterior right middle lobe, lateral right lower lobe and right lung apex. 3. Surgical changes of subtotal colectomy with short segment wall thickening of the neo terminal ileum and short segments of small bowel throughout the abdomen pelvis, with interposed gas and fluid-filled distended loops of small bowel intermixed between the areas of inflammation. Findings are compatible active inflammatory Crohn's enteritis. 4. Small volume right lower  quadrant and pelvic free fluid, likely reactive.     Electronically Signed   By: Dahlia Bailiff M.D.   On: 04/06/2022 12:53     05/02/2022 Surgery   Right lumpectomy: Recurrent grade 3 IDC, posterior margin focally involved by  cancer.   05/17/2022 -  Chemotherapy   Patient is on Treatment Plan : BREAST ADO-Trastuzumab Emtansine (Kadcyla) q21d       CURRENT THERAPY: Kadcyla  INTERVAL HISTORY: [name] 47 y.o. female returns for evaluation prior to receiving Kadcyla treatment.  Her most recent echocardiogram occurred on May 24, 2022 demonstrating a left ventricular ejection fraction of 60 to 65%.  After her first cycle of Kadcyla she did experience some mild diarrhea.  This is since resolved and she has Imodium for if it happens with this next cycle.  She is doing well.  She notes that she wants to start some protein shakes with carbohydrates to help keep weight on her body.  She denies any other significant issues and is ready to proceed with treatment today.    Patient Active Problem List   Diagnosis Date Noted   Leucocytosis 05/27/2021   Crohn's disease of small and large intestines with complication ([place])    SBO (small bowel obstruction) ([place]) 01/26/2021   History of Crohn's disease    Breast cancer ([place]) 11/09/2020   Port-A-Cath in place 07/15/2020   Genetic testing 06/25/2020   Family history of breast cancer 06/11/2020   Malignant neoplasm of upper-outer quadrant of right breast in female, estrogen receptor positive ([place]) 06/04/2020   Abnormal finding present on diagnostic imaging of uterus 05/07/2019   Vitamin D deficiency 09/24/2013   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 10/17/2010    is allergic to nsaids and other.  MEDICAL HISTORY: Past Medical History:  Diagnosis Date   Cancer [facility]    Colon stricture ([place]) 05/07/2019   Crohn's colitis, with intestinal obstruction ([place]) 09/17/2013   Dx 2006 - right and left colon involved 2011 - pan-colitis 09/17/2013 left colitis 60-20 cm March 2021 subtotal colectomy ileosigmoid anastomosis for colonic strictures-Dr. Marcello Moores     Crohn's disease [facility])    Family history of breast cancer 06/11/2020   Iron  deficiency anemia secondary to blood loss (chronic) - Crohn's colitis 10/17/2010   Rectal bleeding    Uterine fibroid    Vitamin D deficiency 09/24/2013    SURGICAL HISTORY: Past Surgical History:  Procedure Laterality Date   AXILLARY LYMPH NODE DISSECTION Right 11/09/2020   Procedure: RIGHT AXILLARY LYMPH NODE DISSECTION;  Surgeon: [name], MD;  Location: [place];  Service: General;  Laterality: Right;   BIOPSY  07/01/2020   Procedure: BIOPSY;  Surgeon: Yetta Flock, MD;  Location[facility];  Service: Gastroenterology;;   BIOPSY  01/27/2021   Procedure: BIOPSY;  Surgeon: Yetta Flock, MD;  [facility];  Service: Gastroenterology;;   BIOPSY  09/07/2021   Procedure: BIOPSY;  Surgeon: Jackquline Denmark, MD;  [facility];  Service: Gastroenterology;;   BREAST BIOPSY Right 03/24/2022   [place] RT BREAST BX W LOC DEV 1ST LESION IMG BX [place] US GUIDE 03/24/2022 [facility]   BREAST BIOPSY  04/28/2022   [place] RT RADIOACTIVE SEED LOC 04/28/2022 [facility]   BREAST RECONSTRUCTION WITH PLACEMENT OF TISSUE EXPANDER AND FLEX HD (ACELLULAR HYDRATED DERMIS) Bilateral 11/09/2020   Procedure: BILATERAL BREAST RECONSTRUCTION WITH PLACEMENT OF TISSUE EXPANDER AND FLEX HD (ACELLULAR HYDRATED DERMIS);  Surgeon: Mingo Amber  S, MD;  Location: [place];  Service: Plastics;  Laterality: Bilateral;   CAPSULOTOMY Bilateral 07/25/2021   Procedure: CAPSULOTOMY;  Surgeon: [name], MD;  Location: [place];  Service: Plastics;  Laterality: Bilateral;   COLECTOMY  06/27/2019   COLONOSCOPY  2015   FLEXIBLE SIGMOIDOSCOPY N/A 07/01/2020   Procedure: FLEXIBLE SIGMOIDOSCOPY;  Surgeon: Yetta Flock, MD;  [facility];  Service: Gastroenterology;  Laterality: N/A;   FLEXIBLE SIGMOIDOSCOPY N/A 01/27/2021   Procedure: FLEXIBLE SIGMOIDOSCOPY;  Surgeon: Yetta Flock, MD;  [facility];   Service: Gastroenterology;  Laterality: N/A;   FLEXIBLE SIGMOIDOSCOPY N/A 09/07/2021   Procedure: FLEXIBLE SIGMOIDOSCOPY;  Surgeon: Jackquline Denmark, MD;  [facility];  Service: Gastroenterology;  Laterality: N/A;   NIPPLE SPARING MASTECTOMY Bilateral 11/09/2020   Procedure: BILATERAL NIPPLE SPARING MASTECTOMY;  Surgeon: [name], MD;  Location: [place];  Service: General;  Laterality: Bilateral;   PORT-A-CATH REMOVAL N/A 07/25/2021   Procedure: REMOVAL PORT-A-CATH;  Surgeon: [name], MD;  Location: [place];  Service: Plastics;  Laterality: N/A;   PORTACATH PLACEMENT N/A 06/23/2020   Procedure: INSERTION PORT-A-CATH;  Surgeon: [name], MD;  Location: [place];  Service: General;  Laterality: N/A;  START TIME OF 11:00 AM FOR 60 MINUTES [place] [facility]   PORTACATH PLACEMENT Left 05/02/2022   Procedure: INSERTION PORT-A-CATH;  Surgeon: [name], MD;  Location: [place];  Service: General;  Laterality: Left;   RADIOACTIVE SEED GUIDED EXCISIONAL BREAST BIOPSY Right 05/02/2022   Procedure: RADIOACTIVE SEED GUIDED RIGHT BREAST MASS EXCISION;  Surgeon: [name], MD;  Location: [place];  Service: General;  Laterality: Right;   REMOVAL OF BILATERAL TISSUE EXPANDERS WITH PLACEMENT OF BILATERAL BREAST IMPLANTS Bilateral 07/25/2021   Procedure: REMOVAL OF BILATERAL TISSUE EXPANDERS WITH PLACEMENT OF BILATERAL BREAST IMPLANTS;  Surgeon: [name], MD;  Location: [place];  Service: Plastics;  Laterality: Bilateral;  1.5    SOCIAL HISTORY: Social History   Socioeconomic History   Marital status: Significant Other    Spouse name: Not on file   Number of children: 1   Years of education: 13   Highest education level: Not on file  Occupational History   Occupation: Medical Coder   Tobacco Use   Smoking status: Former    Types: Cigarettes   Smokeless  tobacco: Never  Vaping Use   Vaping Use: Never used  Substance and Sexual Activity   Alcohol use: Yes    Alcohol/week: 1.0 - 2.0 standard drink of alcohol    Types: 1 - 2 Glasses of wine per week   Drug use: No   Sexual activity: Yes    Partners: Male    Comment: Intermittent protection not consistent  Other Topics Concern   Not on file  Social History Narrative   Single, 1 daughter born 2001 approximately   Medical coder for [employer]   She is a former smoker, occasional alcohol no drug use   Social Determinants of Radio broadcast assistant Strain: Not on file  Food Insecurity: Not on file  Transportation Needs: Not on file  Physical Activity: Not on file  Stress: Not on file  Social Connections: Not on file  Intimate Partner Violence: Not At Risk (11/25/2020)   Humiliation, Afraid, Rape, and Kick questionnaire    Fear of Current or Ex-Partner: No    Emotionally Abused: No    Physically  Abused: No    Sexually Abused: No    FAMILY HISTORY: Family History  Problem Relation Age of Onset   Diabetes Father    Breast cancer Maternal Aunt        dx unknown age   Diabetes Paternal 25    Breast cancer Cousin        maternal cousin; dx unknown age   Esophageal cancer Neg Hx    Rectal cancer Neg Hx    Stomach cancer Neg Hx    Colon polyps Neg Hx    Colon cancer Neg Hx     Review of Systems  Constitutional:  Negative for appetite change, chills, fatigue, fever and unexpected weight change.  HENT:   Negative for hearing loss, lump/mass and trouble swallowing.   Eyes:  Negative for eye problems and icterus.  Respiratory:  Negative for chest tightness, cough and shortness of breath.   Cardiovascular:  Negative for chest pain, leg swelling and palpitations.  Gastrointestinal:  Negative for abdominal distention, abdominal pain, constipation, diarrhea, nausea and vomiting.  Endocrine: Negative for hot flashes.  Genitourinary:  Negative for difficulty urinating.    Musculoskeletal:  Negative for arthralgias.  Skin:  Negative for itching and rash.  Neurological:  Negative for dizziness, extremity weakness, headaches and numbness.  Hematological:  Negative for adenopathy. Does not bruise/bleed easily.  Psychiatric/Behavioral:  Negative for depression. The patient is not nervous/anxious.       PHYSICAL EXAMINATION  ECOG PERFORMANCE STATUS: 1 - Symptomatic but completely ambulatory  Vitals:   06/09/22 1017  BP: 123/84  Pulse: 95  Resp: 14  Temp: (!) 97.5 F (36.4 C)  SpO2: 100%    Physical Exam Constitutional:      General: She is not in acute distress.    Appearance: Normal appearance. She is not toxic-appearing.  HENT:     Head: Normocephalic and atraumatic.  Eyes:     General: No scleral icterus. Cardiovascular:     Rate and Rhythm: Normal rate and regular rhythm.     Pulses: Normal pulses.     Heart sounds: Normal heart sounds.  Pulmonary:     Effort: Pulmonary effort is normal.     Breath sounds: Normal breath sounds.  Abdominal:     General: Abdomen is flat. Bowel sounds are normal. There is no distension.     Palpations: Abdomen is soft.     Tenderness: There is no abdominal tenderness.  Musculoskeletal:        General: No swelling.     Cervical back: Neck supple.  Lymphadenopathy:     Cervical: No cervical adenopathy.  Skin:    General: Skin is warm and dry.     Findings: No rash.  Neurological:     General: No focal deficit present.     Mental Status: She is alert.  Psychiatric:        Mood and Affect: Mood normal.        Behavior: Behavior normal.     LABORATORY DATA:  CBC    Component Value Date/Time   WBC 6.8 06/09/2022 1003   WBC 10.3 10/20/2021 1333   RBC 3.96 06/09/2022 1003   HGB 10.5 (L) 06/09/2022 1003   HGB 10.4 (L) 01/21/2020 1056   HCT 31.8 (L) 06/09/2022 1003   HCT 33.9 (L) 01/21/2020 1056   PLT 263 06/09/2022 1003   PLT 346 01/21/2020 1056   MCV 80.3 06/09/2022 1003   MCV 71 (L)  01/21/2020 1056   MCH 26.5 06/09/2022  1003   MCHC 33.0 06/09/2022 1003   RDW 15.1 06/09/2022 1003   RDW 18.8 (H) 01/21/2020 1056   LYMPHSABS 1.5 06/09/2022 1003   LYMPHSABS 1.3 01/21/2020 1056   MONOABS 0.7 06/09/2022 1003   EOSABS 0.1 06/09/2022 1003   EOSABS 0.0 01/21/2020 1056   BASOSABS 0.0 06/09/2022 1003   BASOSABS 0.0 01/21/2020 1056    CMP     Component Value Date/Time   NA 137 06/09/2022 1003   NA 135 01/21/2020 1056   K 3.1 (L) 06/09/2022 1003   CL 105 06/09/2022 1003   CO2 26 06/09/2022 1003   GLUCOSE 113 (H) 06/09/2022 1003   BUN 11 06/09/2022 1003   BUN 12 01/21/2020 1056   CREATININE 0.92 06/09/2022 1003   CALCIUM 8.9 06/09/2022 1003   PROT 7.3 06/09/2022 1003   ALBUMIN 3.8 06/09/2022 1003   AST 27 06/09/2022 1003   ALT 17 06/09/2022 1003   ALKPHOS 104 06/09/2022 1003   BILITOT 0.9 06/09/2022 1003   GFRNONAA >60 06/09/2022 1003   GFRAA 108 01/21/2020 1056       ASSESSMENT and THERAPY PLAN:   Malignant neoplasm of upper-outer quadrant of right breast in female, estrogen receptor positive ([place]) [name] is a 47 year old woman with recurrent HER2 positive breast cancer status postlumpectomy here today for follow-up and evaluation prior to receiving her second cycle of Kadcyla.  Her labs today are stable and I reviewed them with her in detail.  She will proceed with treatment today.  She has Imodium on hand if she develops any more diarrhea.  She also can drink protein shakes with carbohydrates.  We will see her back in 3 weeks for labs, follow-up, and her next appointment.   All questions were answered. The patient knows to call the clinic with any problems, questions or concerns. We can certainly see the patient much sooner if necessary.  Total encounter time:20 minutes*in face-to-face visit time, chart review, lab review, care coordination, order entry, and documentation of the encounter time.    Wilber Bihari, NP 06/09/22 1:46 PM Medical Oncology  and Hematology [facility] [place], [place] [address] Tel. [phone]    Fax. [phone]  *Total Encounter Time as defined by the Centers for Medicare and Medicaid Services includes, in addition to the face-to-face time of a patient visit (documented in the note above) non-face-to-face time: obtaining and reviewing outside history, ordering and reviewing medications, tests or procedures, care coordination (communications with other health care professionals or caregivers) and documentation in the medical record.

## 2022-06-09 NOTE — Patient Instructions (Signed)
[facility]  Discharge Instructions: Thank you for choosing [facility] to provide your oncology and hematology care.   If you have a lab appointment with the [facility], please go directly to the [facility] and check in at the registration area.   Wear comfortable clothing and clothing appropriate for easy access to any Portacath or PICC line.   We strive to give you quality time with your provider. You may need to reschedule your appointment if you arrive late (15 or more minutes).  Arriving late affects you and other patients whose appointments are after yours.  Also, if you miss three or more appointments without notifying the office, you may be dismissed from the clinic at the provider's discretion.      For prescription refill requests, have your pharmacy contact our office and allow 72 hours for refills to be completed.    Today you received the following chemotherapy and/or immunotherapy agents kadcyla      To help prevent nausea and vomiting after your treatment, we encourage you to take your nausea medication as directed.  BELOW ARE SYMPTOMS THAT SHOULD BE REPORTED IMMEDIATELY: *FEVER GREATER THAN 100.4 F (38 C) OR HIGHER *CHILLS OR SWEATING *NAUSEA AND VOMITING THAT IS NOT CONTROLLED WITH YOUR NAUSEA MEDICATION *UNUSUAL SHORTNESS OF BREATH *UNUSUAL BRUISING OR BLEEDING *URINARY PROBLEMS (pain or burning when urinating, or frequent urination) *BOWEL PROBLEMS (unusual diarrhea, constipation, pain near the anus) TENDERNESS IN MOUTH AND THROAT WITH OR WITHOUT PRESENCE OF ULCERS (sore throat, sores in mouth, or a toothache) UNUSUAL RASH, SWELLING OR PAIN  UNUSUAL VAGINAL DISCHARGE OR ITCHING   Items with * indicate a potential emergency and should be followed up as soon as possible or go to the Emergency Department if any problems should occur.  Please show the CHEMOTHERAPY ALERT CARD or IMMUNOTHERAPY ALERT CARD at check-in  to the Emergency Department and triage nurse.  Should you have questions after your visit or need to cancel or reschedule your appointment, please contact [facility]  Dept: [phone]  and follow the prompts.  Office hours are 8:00 a.m. to 4:30 p.m. Monday - Friday. Please note that voicemails left after 4:00 p.m. may not be returned until the following business day.  We are closed weekends and major holidays. You have access to a nurse at all times for urgent questions. Please call the main number to the clinic Dept: [phone] and follow the prompts.   For any non-urgent questions, you may also contact your provider using MyChart. We now offer e-Visits for anyone 67 and older to request care online for non-urgent symptoms. For details visit [email or web address].   Also download the MyChart app! Go to the app store, search "MyChart", open the app, select [facility], and log in with your MyChart username and password.

## 2022-06-09 NOTE — Telephone Encounter (Signed)
Notified of message below. Provided examples of potassium rich food

## 2022-06-21 ENCOUNTER — Encounter: Payer: Self-pay | Admitting: Physical Therapy

## 2022-06-21 ENCOUNTER — Ambulatory Visit: Payer: Managed Care, Other (non HMO) | Attending: Adult Health

## 2022-06-21 DIAGNOSIS — C50411 Malignant neoplasm of upper-outer quadrant of right female breast: Secondary | ICD-10-CM | POA: Insufficient documentation

## 2022-06-21 DIAGNOSIS — Z483 Aftercare following surgery for neoplasm: Secondary | ICD-10-CM | POA: Diagnosis present

## 2022-06-21 DIAGNOSIS — Z170 Estrogen receptor positive status [ER+]: Secondary | ICD-10-CM | POA: Diagnosis present

## 2022-06-21 DIAGNOSIS — R293 Abnormal posture: Secondary | ICD-10-CM | POA: Insufficient documentation

## 2022-06-21 DIAGNOSIS — I890 Lymphedema, not elsewhere classified: Secondary | ICD-10-CM | POA: Diagnosis present

## 2022-06-21 DIAGNOSIS — M79644 Pain in right finger(s): Secondary | ICD-10-CM | POA: Diagnosis present

## 2022-06-21 NOTE — Therapy (Signed)
OUTPATIENT PHYSICAL THERAPY ONCOLOGY TREATMENT  Patient Name: [name] MRN: [id number] DOB:1976-01-18, 47 y.o., female Today's Date: 06/21/2022  END OF SESSION:  PT End of Session - 06/21/22 1623     Visit Number 6    Number of Visits 7    Date for PT Re-Evaluation 06/21/22    PT Start Time 1603    PT Stop Time 1623    PT Time Calculation (min) 20 min    Activity Tolerance Patient tolerated treatment well    Behavior During Therapy [facility] for tasks assessed/performed               Past Medical History:  Diagnosis Date   Cancer ([place])    Colon stricture ([place]) 05/07/2019   Crohn's colitis, with intestinal obstruction ([place]) 09/17/2013   Dx 2006 - right and left colon involved 2011 - pan-colitis 09/17/2013 left colitis 60-20 cm March 2021 subtotal colectomy ileosigmoid anastomosis for colonic strictures-Dr. Marcello Moores     Crohn's disease [facility])    Family history of breast cancer 06/11/2020   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 10/17/2010   Rectal bleeding    Uterine fibroid    Vitamin D deficiency 09/24/2013   Past Surgical History:  Procedure Laterality Date   AXILLARY LYMPH NODE DISSECTION Right 11/09/2020   Procedure: RIGHT AXILLARY LYMPH NODE DISSECTION;  Surgeon: [name], MD;  Location: [place];  Service: General;  Laterality: Right;   BIOPSY  07/01/2020   Procedure: BIOPSY;  Surgeon: Yetta Flock, MD;  [facility];  Service: Gastroenterology;;   BIOPSY  01/27/2021   Procedure: BIOPSY;  Surgeon: Yetta Flock, MD;  [facility];  Service: Gastroenterology;;   BIOPSY  09/07/2021   Procedure: BIOPSY;  Surgeon: Jackquline Denmark, MD;  [facility];  Service: Gastroenterology;;   BREAST BIOPSY Right 03/24/2022   [place] RT BREAST BX W LOC DEV 1ST LESION IMG BX [place] US GUIDE 03/24/2022 [facility]   BREAST BIOPSY  04/28/2022   [place] RT RADIOACTIVE SEED LOC 04/28/2022  [facility]MOGRAP[facility]   BREAST RECONSTRUCTION WITH PLACEMENT OF TISSUE EXPANDER AND FLEX HD (ACELLULAR HYDRATED DERMIS) Bilateral 11/09/2020   Procedure: BILATERAL BREAST RECONSTRUCTION WITH PLACEMENT OF TISSUE EXPANDER AND FLEX HD (ACELLULAR HYDRATED DERMIS);  Surgeon: [name], MD;  Location: [place];  Service: Plastics;  Laterality: Bilateral;   CAPSULOTOMY Bilateral 07/25/2021   Procedure: CAPSULOTOMY;  Surgeon: [name], MD;  Location: [place];  Service: Plastics;  Laterality: Bilateral;   COLECTOMY  06/27/2019   COLONOSCOPY  2015   FLEXIBLE SIGMOIDOSCOPY N/A 07/01/2020   Procedure: FLEXIBLE SIGMOIDOSCOPY;  Surgeon: Yetta Flock, MD;  [facility];  Service: Gastroenterology;  Laterality: N/A;   FLEXIBLE SIGMOIDOSCOPY N/A 01/27/2021   Procedure: FLEXIBLE SIGMOIDOSCOPY;  Surgeon: Yetta Flock, MD;  [facility];  Service: Gastroenterology;  Laterality: N/A;   FLEXIBLE SIGMOIDOSCOPY N/A 09/07/2021   Procedure: FLEXIBLE SIGMOIDOSCOPY;  Surgeon: Jackquline Denmark, MD;  [facility];  Service: Gastroenterology;  Laterality: N/A;   NIPPLE SPARING MASTECTOMY Bilateral 11/09/2020   Procedure: BILATERAL NIPPLE SPARING MASTECTOMY;  Surgeon: [name], MD;  Location: [place];  Service: General;  Laterality: Bilateral;   PORT-A-CATH REMOVAL N/A 07/25/2021   Procedure: REMOVAL PORT-A-CATH;  Surgeon: [name], MD;  Location: [place];  Service: Plastics;  Laterality: N/A;   PORTACATH PLACEMENT N/A 06/23/2020   Procedure: INSERTION PORT-A-CATH;  Surgeon: [name], MD;  Location: [place];  Service: General;  Laterality: N/A;  START TIME OF 11:00 AM FOR 60 MINUTES [place] [facility]   PORTACATH PLACEMENT Left 05/02/2022   Procedure: INSERTION PORT-A-CATH;  Surgeon: [name], MD;  Location: [place];  Service: General;  Laterality:  Left;   RADIOACTIVE SEED GUIDED EXCISIONAL BREAST BIOPSY Right 05/02/2022   Procedure: RADIOACTIVE SEED GUIDED RIGHT BREAST MASS EXCISION;  Surgeon: [name], MD;  Location: [place];  Service: General;  Laterality: Right;   REMOVAL OF BILATERAL TISSUE EXPANDERS WITH PLACEMENT OF BILATERAL BREAST IMPLANTS Bilateral 07/25/2021   Procedure: REMOVAL OF BILATERAL TISSUE EXPANDERS WITH PLACEMENT OF BILATERAL BREAST IMPLANTS;  Surgeon: [name], MD;  Location: [place];  Service: Plastics;  Laterality: Bilateral;  1.5   Patient Active Problem List   Diagnosis Date Noted   Leucocytosis 05/27/2021   Crohn's disease of small and large intestines with complication ([place])    SBO (small bowel obstruction) ([place]) 01/26/2021   History of Crohn's disease    Breast cancer ([place]) 11/09/2020   Port-A-Cath in place 07/15/2020   Genetic testing 06/25/2020   Family history of breast cancer 06/11/2020   Malignant neoplasm of upper-outer quadrant of right breast in female, estrogen receptor positive ([place]) 06/04/2020   Abnormal finding present on diagnostic imaging of uterus 05/07/2019   Vitamin D deficiency 09/24/2013   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 10/17/2010    PCP: Does not have one  REFERRING PROVIDER: Wilber Bihari, NP  REFERRING DIAG: C50.911 (ICD-10-CM) - Recurrent breast cancer, right ([place])  THERAPY DIAG:  Lymphedema, not elsewhere classified  Pain of right thumb  Abnormal posture  Carcinoma of upper-outer quadrant of right breast in female, estrogen receptor positive ([place])  Aftercare following surgery for neoplasm  ONSET DATE: 01/16/22  Rationale for Evaluation and Treatment: Rehabilitation  SUBJECTIVE:                                                                                                                                                                                           SUBJECTIVE  STATEMENT: I do the massage every other day. The pain in my thumb and wrist area is not as bad. I do the bandaging.   PERTINENT HISTORY:  Patient was diagnosed on 06/04/2020 with right grade III invasive ductal carcinoma breast cancer. She neoadjuvant chemotherapy from 06/25/2020 - 10/29/2020 followed by bilateral mastectomies with expanders placed on 11/09/2020 with 0/12 axillary nodes positive for cancer. It is ER positive, PR negative, and HER2 negative with a Ki67 of 40%.  03/28/22- diagnosed with R recurrence and will undergo a lumpectomy PAIN:  Are you having pain? No  PRECAUTIONS: Other: active cancer - recurrent R breast cancer  WEIGHT BEARING RESTRICTIONS: No  FALLS:  Has patient fallen in last 6 months? No  LIVING ENVIRONMENT: Lives with: lives with their daughter and grandson who is 29 months old Lives in: House/apartment Stairs: No;  Has following equipment at home: None  OCCUPATION: full time - Careers information officer  LEISURE: walks, lifts weights 5lbs, squats 2x/wk  HAND DOMINANCE: right   PRIOR LEVEL OF FUNCTION: Independent  PATIENT GOALS: to get the swelling down and the pain to stop   OBJECTIVE:  COGNITION: Overall cognitive status: Within functional limits for tasks assessed   PALPATION: Tenderness along thumb extending to wrist  OBSERVATIONS / OTHER ASSESSMENTS: R hand is visibly swollen  POSTURE: forward head, rounded shoulders  UPPER EXTREMITY AROM/PROM: WFL   LYMPHEDEMA ASSESSMENTS:   SURGERY TYPE/DATE: bilateral mastectomy 11/09/20 and will need a lumpectomy for recent recurrence on R side  NUMBER OF LYMPH NODES REMOVED: 0/12  CHEMOTHERAPY: completed  RADIATION:completed 2022  LYMPHEDEMA ASSESSMENTS:   LANDMARK RIGHT  eval RIGHT 04/20/2022 RIGHT 05/10/22 RIGHT 06/06/2022 RIGHT 06/21/22  10 cm proximal to olecranon process 25.4 26.7  26.5   Olecranon process 22.2 23.2  22.7   10 cm proximal to ulnar styloid process 18.4 19.6  19.5   Just proximal to  ulnar styloid process 15.3 15.1 15.3 15.3 15.5  Across hand at thumb web space 19.2 19.5 19.1 18.9 19  At base of 2nd digit 6.5 6.6 6 6.1 6.1  (Blank rows = not tested)  LANDMARK LEFT  eval  10 cm proximal to olecranon process 25.5  Olecranon process 23  10 cm proximal to ulnar styloid process 18.4  Just proximal to ulnar styloid process 14.5  Across hand at thumb web space 17.9  At base of 2nd digit 5.7  (Blank rows = not tested)    L-DEX LYMPHEDEMA SCREENING:  The patient was assessed using the L-Dex machine today to produce a lymphedema index baseline score. The patient will be reassessed on a regular basis (typically every 3 months) to obtain new L-Dex scores. If the score is > 6.5 points away from his/her baseline score indicating onset of subclinical lymphedema, it will be recommended to wear a compression garment for 4 weeks, 12 hours per day and then be reassessed. If the score continues to be > 6.5 points from baseline at reassessment, we will initiate lymphedema treatment. Assessing in this manner has a 95% rate of preventing clinically significant lymphedema.      QUICK DASH SURVEY:         LLIS: 23.53  TODAY'S TREATMENT:  DATE:  06/21/22: Answered pt's questions regarding her lymphedema. Educated her that elevating her arm at night is not necessary when she has her bandages on. Educated pt that the night time garment would replace her bandages once she receives it.  Pt measured for a CircAid profile off the shelf. She was one cm out of the range of the upper arm (G). Her arm was 28.5 cm and the upper arm range starts at 29.5. She may benefit from a custom if it is covered.   06/06/2022 Pt re- measured Instructed pt in compression bandaging to fingers x 2 with therapist demonstrating and pt then wrapping using counter for  assistance Also instructed in hand and arm wrap x 1 ea  with emphasis on keeping fingers apart when wrapping hand so she doesn't get it too tight. Practiced hand and arm again using table for assistance. Showed pt circaid night garment which she may be interested in purchasing as she thinks she has met her in network out of pocket. Pt gave permission to send  to [facility] Discussed using her wrist splint when she is home from work and having to lift her 34 month old grandson as that is a likely stressor for her wrist pain. Reviewed how to perform MLD across the chest and avoiding her portacath.;she verbalized understanding   05/10/22:  Educated pt how to apply compression bandaging from wrist to axilla. She was previously instructed in how to apply hand and finger bandages. Cut Komprex II foam and 1/2 grey foam to alternate using on back of hand with bandaging to see if that will help decrease edema at back of hand. Educated pt that she can don Komprex II inside her glove on the dorsum of hand. Remeasured l dex today and pt has increased since last session. At this point she most likely has transitioned from subclinical lymphedema to lymphedema. Educated pt to continue to wear her sleeve during the day and to begin bandaging hand and arm every night for the next 2 weeks and then will reassess at next session and answer any questions she has. She has been wearing her glove and demonstrates great reduction in finger edema but still has edema at dorsum of hand.   04/26/2022 Reviewed self MLD with pt in sitting to the right UE with VC's for correct pressure and sequence, Short neck, 5 diaphragmatic breaths, activate left axillary and right inguinal LN's, Anterior interaxillary pathway, right axillo-inguinal pathway, Right outer arm, inner to outer arm, outer arm then retracing steps then forearm retracing steps and hand retracing steps and ending with LN's. Pt required occasional VC's and TC's but did very  well overall. Discussed HSA and if pt had enough that she could use to cover her sleeve and glove.  She had enough money so she was given info to order [employer] and glove in 30-40 compression. Pt applied her sleeve and therapist started instructing pt in finger wrap and PT performed once and then pt performed. She struggled initially with keeping wrap straight but improved as she did more. She used the counter for assist. She had to leave at 4:45 to get her grandchild from daycare so I applied hand wrap with instruction to pt. I gave her video instructions for bandaging. Showed her how to check capillary refill and instructed to remove if wrap is ever painful or causes numbness.  She knows she must remove sleeve and wrap before going to bed and reapply in am.Pt verbalized understanding.  04/20/2022 Pt shown how to remove brace and range her wrist so it doesn't get tight, and advised not to wear at night to see if finger/hand swelling will improve and not to get wrist area on brace too tight. Measured arm for therapy note and for sleeve and glove fit. She measured into a Size 1 Medi Harmony sleeve and size 2  Harmony glove. I had her try on the harmony sleeve here and it fit well. We discussed insurance vs paying outright. She will be a full pay for sleeve right now since she hasn't met her out of pocket. Cost with compression guru is nearly $200.00. She would like to wait until after her surgery to order her sleeve, and would like to try wrapping her hand while she wears the sleeve she has. Advised we could try that next visit. Had pt sit in a chair so she could observe me and instructed in self MLD to the right UE with VC's for correct pressure and sequence, Short neck, 5 diaphragmatic breaths, activate left axillary and right inguinal LN's, Anterior interaxillary pathway, right axillo-inguinal pathway, Right outer arm, inner to outer arm, outer arm then retracing steps then forearm retracing  steps and hand retracing steps and ending with LN's. Pt was given a handout for home use      04/07/22: Educated pt to purchase a brace for her wrist and thumb to immobilize her thumb to allow it to heal. Issued 3 exercises to help decrease pain along tendons of R thumb. Educated her to wear the brace during daytime hours for about a week and to avoid twisting motions and lifting. Also educated pt that she may need a custom compression sleeve but will assess for this at next visit.   PATIENT EDUCATION:  Education details: tenosynovitis and causes, need for wrist splint, exercises to help with thumb pain, lymphedema vs subclinical lymphedema Person educated: Patient Education method: Explanation, Demonstration, and Handouts Education comprehension: verbalized understanding and returned demonstration  HOME EXERCISE PROGRAM: , Thumb lifts, Place your hand on a flat surface, with your palm up. Lift your thumb away from your palm to make a "C" shape. Hold for about 6 seconds. Repeat 8 to 12 times. Passive thumb MP flexion, Hold your hand in front of you, and turn your hand so your little finger faces down and your thumb faces up. (Your hand should be in the position used for shaking someone's hand.) You may also rest your hand on a flat surface. Use the fingers on your other hand to bend your thumb down at the point where your thumb connects to your palm. Hold for at least 15 to 30 seconds. Repeat 2 to 4 times. Finkelstein stretch, Hold your arms out in front of you. (Your hand should be in the position used for shaking someone's hand.) Bend your thumb toward your palm. Use your other hand to gently stretch your thumb and wrist downward until you feel a stretch on the thumb side of your wrist. Hold for at least 15 to 30 seconds. Repeat 2 to 4 times. It's a good idea to repeat these steps with your other wrist.       The patient was assessed using the L-Dex machine today to produce a  lymphedema index baseline score. The patient will be reassessed on a regular basis (typically every 3 months) to obtain new L-Dex scores. If the score is > 6.5 points away from his/her baseline score indicating onset of subclinical lymphedema, it will  be recommended to wear a compression garment for 4 weeks, 12 hours per day and then be reassessed. If the score continues to be > 6.5 points from baseline at reassessment, we will initiate lymphedema treatment. Assessing in this manner has a 95% rate of preventing clinically significant lymphedema.   ASSESSMENT:  CLINICAL IMPRESSION: Pt reports her wrist and hand pain is much better - 80% improved. She has been managing her RUE lymphedema through self MLD and bandaging which she does every other night. Her finger circumference is much smaller than it was at eval. She was measured for a night time garment today. Pt feels she is ready for discharge from skilled PT services at this time.   OBJECTIVE IMPAIRMENTS: decreased knowledge of condition, decreased knowledge of use of DME, increased edema, increased fascial restrictions, impaired UE functional use, and pain.   ACTIVITY LIMITATIONS: carrying, lifting, and caring for others  PARTICIPATION LIMITATIONS: meal prep, cleaning, and occupation  PERSONAL FACTORS: Time since onset of injury/illness/exacerbation are also affecting patient's functional outcome.   REHAB POTENTIAL: Good  CLINICAL DECISION MAKING: Stable/uncomplicated  EVALUATION COMPLEXITY: Low  GOALS: Goals reviewed with patient? Yes  LONG TERM GOALS=SHORT TERM GOALS Target date: 05/05/22  Pt will report no pain in thumb and wrist when moving her thumb.  Baseline:  Goal status: PARTIALLY MET 50% improved on 05/10/22; 80% improved as of 06/21/22  2.  Pt will obtain an appropriate compression sleeve and glove for long term management of lymphedema.  Baseline:  Goal status: MET 05/10/22  4.  Pt will be independent in self MLD for long  term management of lymphedema  Baseline:  Goal status: MET 05/10/22  5.  Pt will be independent in a home exercise program to decrease thumb and wrist pain on R side.  Baseline:  Goal status: MET 05/10/22  6.  Pt will demonstrate a 0.5 decrease in edema at dorsum of hand to allow improved comfort.  Baseline:  Goal status: NOT MET 06/21/22 - decreased from eval but majority of edema was in her finger and it decreased a lot from eval  PLAN:  PT FREQUENCY: every other week  PT DURATION: 6 weeks  PLANNED INTERVENTIONS: Therapeutic exercises, Therapeutic activity, Patient/Family education, Self Care, Joint mobilization, Orthotic/Fit training, DME instructions, Manual lymph drainage, Compression bandaging, scar mobilization, Taping, Manual therapy, and Re-evaluation  PLAN FOR NEXT SESSION: d/c this session   Manus Gunning, PT 06/21/2022, 4:29 PM  [facility] [address] [place], [place], [address] Phone: [phone]   Fax:  [phone]  PHYSICAL THERAPY DISCHARGE SUMMARY  Visits from Start of Care: 6  Current functional level related to goals / functional outcomes: All goals met   Remaining deficits: Still some wrist pain but 80% improved   Education / Equipment: Self MLD, how to self bandage   Patient agrees to discharge. Patient goals were partially met. Patient is being discharged due to meeting the stated rehab goals.  Allyson Sabal [place], [place] 06/21/22 4:29 PM

## 2022-06-28 ENCOUNTER — Ambulatory Visit: Payer: Managed Care, Other (non HMO) | Admitting: Internal Medicine

## 2022-06-28 ENCOUNTER — Encounter: Payer: Self-pay | Admitting: Physical Therapy

## 2022-06-29 ENCOUNTER — Other Ambulatory Visit: Payer: Managed Care, Other (non HMO)

## 2022-06-29 ENCOUNTER — Ambulatory Visit: Payer: Managed Care, Other (non HMO) | Admitting: Hematology and Oncology

## 2022-06-29 ENCOUNTER — Ambulatory Visit: Payer: Managed Care, Other (non HMO)

## 2022-06-29 ENCOUNTER — Encounter: Payer: Self-pay | Admitting: Physical Therapy

## 2022-06-29 NOTE — Progress Notes (Signed)
Patient Care Team: Patient, [name] as PCP - General (General Practice) [name], RN as Oncology Nurse Navigator [name], RN as Oncology Nurse Navigator [name], MD as Consulting Physician (Colon and Rectal Surgery) [name], MD as Consulting Physician (Gastroenterology) [name], MD as Consulting Physician (General Surgery) [name], MD as Consulting Physician (Hematology and Oncology)  DIAGNOSIS:  Encounter Diagnosis  Name Primary?   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 palpable right breast mass measuring about 7 cm x 5 and half centimeters, palpable right axillary lymph node with concern for skin invasion    06/08/2020 Cancer Staging   Staging form: Breast, AJCC 8th Edition - Clinical: Stage IIIB (cT4d, cN1, cM0, G3, ER+, PR-, HER2+) - Signed by [name], MD on 07/07/2020 Histologic grading system: 3 grade system   06/24/2020 Genetic Testing   No pathogenic variants detected in [facility] [employer] [facility]RNAinsight.  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  The report date is June 24, 2020.   The CustomNext-Cancer+RNAinsight panel offered by [facility] includes sequencing and rearrangement analysis for the following 47 genes:  APC, ATM, AXIN2, BARD1, BMPR1A, BRCA1, BRCA2, BRIP1, CDH1, CDK4, CDKN2A, CHEK2, DICER1, EPCAM, GREM1, HOXB13, MEN1, MLH1, MSH2, MSH3, MSH6, MUTYH, NBN, NF1, NF2,  NTHL1, PALB2, PMS2, POLD1, POLE, PTEN, RAD51C, RAD51D, RECQL, RET, SDHA, SDHAF2, SDHB, SDHC, SDHD, SMAD4, SMARCA4, STK11, TP53, TSC1, TSC2, and VHL.  RNA data is routinely analyzed for use in variant interpretation for all genes.   06/25/2020 - 10/29/2020 Chemotherapy   TCHP x6 cycles   11/09/2020 Surgery   Bilateral mastectomies: Left mastectomy: Benign Right mastectomy: Residual microinvasive cancer status post neoadjuvant therapy, DCIS, 0/12 lymph nodes, ER +1%, PR negative, HER2 equivocal 2+ by [facility]   11/19/2020 - 07/15/2021 Chemotherapy   Patient is on Treatment Plan : BREAST Trastuzumab  + Pertuzumab q21d x 13 cycles     01/11/2021 - 03/09/2021 Radiation Therapy   Site Technique Total Dose (Gy) Dose per Fx (Gy) Completed Fx Beam Energies  Chest Wall, Right: CW_Rt 3D 50.4/50.4 1.8 28/28 10X  Chest Wall, Right: CW_Rt_SCLV 3D 50.4/50.4 1.8 28/28 6X, 10X  Chest Wall, Right: CW_Rt_Bst Electron 10/10 2 5/5 6E     06/08/2021 - 06/24/2021 Anti-estrogen oral therapy   Tamoxifen (discontinued)   03/24/2022 Relapse/Recurrence   Right chest palpable nodule: 1.2 cm mass at 10 o'clock position biopsy revealed grade 2 IDC with high-grade DCIS ER 20% positive weak staining, PR 0%, HER2 3+ positive, Ki-67 50%   04/06/2022 Imaging   CT chest/abd/pelvis  IMPRESSION: 1. No evidence of metastatic disease in the chest, abdomen or pelvis. 2. Similar probable postradiation change in the peripheral anterior right upper lobe, anterior right middle lobe, lateral right lower lobe and right lung apex. 3. Surgical changes of subtotal colectomy with short segment wall thickening of the neo terminal ileum and short segments of small bowel throughout the abdomen pelvis, with interposed gas and fluid-filled distended loops  of small bowel intermixed between the areas of inflammation. Findings are compatible active inflammatory Crohn's enteritis. 4. Small volume right lower quadrant and pelvic free fluid,  likely reactive.     Electronically Signed   By: Dahlia Bailiff M.D.   On: 04/06/2022 12:53     05/02/2022 Surgery   Right lumpectomy: Recurrent grade 3 IDC, posterior margin focally involved by cancer.   05/17/2022 -  Chemotherapy   Patient is on Treatment Plan : BREAST ADO-Trastuzumab Emtansine (Kadcyla) q21d       CHIEF COMPLIANT: Cycle 3 Kadcyla  INTERVAL HISTORY: [name] is a  47 y.o. with above-mentioned history of HER-2 positive right breast cancer currently on Herceptin Perjeta maintenance. She presents to the clinic today for follow-up. She denies any symptoms she says she does be fatigue, but she manages it with coffee. She does get loose stools.   ALLERGIES:  is allergic to nsaids and other.  MEDICATIONS:  Current Outpatient Medications  Medication Sig Dispense Refill   acetaminophen (TYLENOL) 500 MG tablet Take 500-1,000 mg by mouth every 6 (six) hours as needed for mild pain.     lidocaine-prilocaine (EMLA) cream Apply to affected area once 30 g 3   Multiple Vitamins-Minerals (MULTIVITAMIN WITH MINERALS) tablet Take 1 tablet by mouth daily.     ondansetron (ZOFRAN) 8 MG tablet Take 1 tablet (8 mg total) by mouth every 8 (eight) hours as needed for nausea or vomiting. 30 tablet 1   potassium chloride SA (KLOR-CON M) 20 MEQ tablet Take 1 tablet (20 mEq total) by mouth daily. 90 tablet 2   prochlorperazine (COMPAZINE) 10 MG tablet Take 1 tablet (10 mg total) by mouth every 6 (six) hours as needed for nausea or vomiting. 30 tablet 1   vedolizumab (ENTYVIO) 300 MG injection Inject into the vein.     No current facility-administered medications for this visit.    PHYSICAL EXAMINATION: ECOG PERFORMANCE STATUS: 1 - Symptomatic but completely ambulatory  Vitals:   06/30/22 0951  BP: 125/73  Pulse: 93  Resp: 18  Temp: 97.7 F (36.5 C)  SpO2: 100%   Filed Weights   06/30/22 0951  Weight: 133 lb 12.8 oz (60.7 kg)     LABORATORY DATA:  I have reviewed the  data as listed    Latest Ref Rng & Units 06/30/2022    9:27 AM 06/09/2022   10:03 AM 05/24/2022    9:27 AM  CMP  Glucose 70 - 99 mg/dL 106  113  115   BUN 6 - 20 mg/dL 9  11  8    Creatinine 0.44 - 1.00 mg/dL 0.92  0.92  0.91   Sodium 135 - 145 mmol/L 139  137  142   Potassium 3.5 - 5.1 mmol/L 3.4  3.1  3.2   Chloride 98 - 111 mmol/L 106  105  106   CO2 22 - 32 mmol/L 28  26  30    Calcium 8.9 - 10.3 mg/dL 8.9  8.9  9.1   Total Protein 6.5 - 8.1 g/dL 6.6  7.3  7.2   Total Bilirubin 0.3 - 1.2 mg/dL 0.4  0.9  0.5   Alkaline Phos 38 - 126 U/L 107  104  137   AST 15 - 41 U/L 21  27  40   ALT 0 - 44 U/L 17  17  19      Lab Results  Component Value Date   WBC 6.1 06/30/2022   HGB 10.2 (L) 06/30/2022  HCT 31.5 (L) 06/30/2022   MCV 80.4 06/30/2022   PLT 271 06/30/2022   NEUTROABS 3.6 06/30/2022    ASSESSMENT & PLAN:  Malignant neoplasm of upper-outer quadrant of right breast in female, estrogen receptor positive ([place]) 06/03/2020, US guided biopsy of the right breast 9 0 clock mass showed grade III IDC; Prognostics showed ER 40% positive, weak staining, PR positive 0 %, Her 2 3 +, Ki 67 30% [id number] 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 palpable right breast mass measuring about 7 cm x 5 and half centimeters, palpable right axillary lymph node with concern for skin invasion    Treatment Plan: 1. Neoadjuvant chemo with TCHP q 3 weeks X 6 followed by HP completed 07/07/2021 2. Bilateral mastectomies: Left mastectomy: Benign Right mastectomy: Residual microinvasive cancer status post neoadjuvant therapy, DCIS, 0/12 lymph nodes, ER +1%, PR negative, HER2 equivocal 2+ by IHC 3. Adj XRT completed 02/23/2021 4. Adj Anti-estrogen therapy with tamoxifen started 06/08/2021 discontinued 06/24/2021 (ER was only 1% therefore we felt risks outweigh benefits) 5. Neratinib: Because of her bowel issues she decided not to take  it. --------------------------------------------------------------------------------------------------------- Hospitalization 05/18/2021-06/13/2021 (small bowel obstruction) Right chest palpable nodule: 1.2 cm mass at 10 o'clock position biopsy revealed grade 2 IDC with high-grade DCIS ER 20% positive weak staining, PR 0%, HER2 3+ positive, Ki-67 50%   05/02/2022: Right lumpectomy: Recurrent grade 3 IDC, posterior margin focally involved by cancer   Current treatment: Kadcyla cycle 3 Kadcyla toxicities: Mild intermittent diarrhea Fatigue that lasted 3 days Very mild nausea Hypokalemia: I instructed the patient to resume taking potassium supplement Chemotherapy-induced anemia: She will start taking iron tablets once a day.   Return to clinic 3 weeks for cycle 4    No orders of the defined types were placed in this encounter.  The patient has a good understanding of the overall plan. she agrees with it. she will call with any problems that may develop before the next visit here. Total time spent: 30 mins including face to face time and time spent for planning, charting and co-ordination of care   Harriette Ohara, MD 06/30/22    I Gardiner Coins am acting as a Education administrator for [employer]  I have reviewed the above documentation for accuracy and completeness, and I agree with the above.

## 2022-06-30 ENCOUNTER — Inpatient Hospital Stay: Payer: Managed Care, Other (non HMO)

## 2022-06-30 ENCOUNTER — Inpatient Hospital Stay (HOSPITAL_BASED_OUTPATIENT_CLINIC_OR_DEPARTMENT_OTHER): Payer: Managed Care, Other (non HMO) | Admitting: Hematology and Oncology

## 2022-06-30 VITALS — BP 117/82 | HR 92 | Resp 18

## 2022-06-30 VITALS — BP 125/73 | HR 93 | Temp 97.7°F | Resp 18 | Ht 64.0 in | Wt 133.8 lb

## 2022-06-30 DIAGNOSIS — C50411 Malignant neoplasm of upper-outer quadrant of right female breast: Secondary | ICD-10-CM | POA: Diagnosis not present

## 2022-06-30 DIAGNOSIS — Z170 Estrogen receptor positive status [ER+]: Secondary | ICD-10-CM | POA: Diagnosis not present

## 2022-06-30 DIAGNOSIS — Z95828 Presence of other vascular implants and grafts: Secondary | ICD-10-CM

## 2022-06-30 DIAGNOSIS — Z5112 Encounter for antineoplastic immunotherapy: Secondary | ICD-10-CM | POA: Diagnosis not present

## 2022-06-30 LAB — CMP (CANCER CENTER ONLY)
ALT: 17 U/L (ref 0–44)
AST: 21 U/L (ref 15–41)
Albumin: 3.6 g/dL (ref 3.5–5.0)
Alkaline Phosphatase: 107 U/L (ref 38–126)
Anion gap: 5 (ref 5–15)
BUN: 9 mg/dL (ref 6–20)
CO2: 28 mmol/L (ref 22–32)
Calcium: 8.9 mg/dL (ref 8.9–10.3)
Chloride: 106 mmol/L (ref 98–111)
Creatinine: 0.92 mg/dL (ref 0.44–1.00)
GFR, Estimated: >60 mL/min (ref >60)
Glucose, Bld: 106 mg/dL — ABNORMAL HIGH (ref 70–99)
Potassium: 3.4 mmol/L — ABNORMAL LOW (ref 3.5–5.1)
Sodium: 139 mmol/L (ref 135–145)
Total Bilirubin: 0.4 mg/dL (ref 0.3–1.2)
Total Protein: 6.6 g/dL (ref 6.5–8.1)

## 2022-06-30 LAB — CBC WITH DIFFERENTIAL (CANCER CENTER ONLY)
Abs Immature Granulocytes: 0.01 10*3/uL (ref 0.00–0.07)
Basophils Absolute: 0 10*3/uL (ref 0.0–0.1)
Basophils Relative: 1 %
Eosinophils Absolute: 0.1 10*3/uL (ref 0.0–0.5)
Eosinophils Relative: 1 %
HCT: 31.5 % — ABNORMAL LOW (ref 36.0–46.0)
Hemoglobin: 10.2 g/dL — ABNORMAL LOW (ref 12.0–15.0)
Immature Granulocytes: 0 %
Lymphocytes Relative: 31 %
Lymphs Abs: 1.9 10*3/uL (ref 0.7–4.0)
MCH: 26 pg (ref 26.0–34.0)
MCHC: 32.4 g/dL (ref 30.0–36.0)
MCV: 80.4 fL (ref 80.0–100.0)
Monocytes Absolute: 0.5 10*3/uL (ref 0.1–1.0)
Monocytes Relative: 8 %
Neutro Abs: 3.6 10*3/uL (ref 1.7–7.7)
Neutrophils Relative %: 59 %
Platelet Count: 271 10*3/uL (ref 150–400)
RBC: 3.92 MIL/uL (ref 3.87–5.11)
RDW: 14.8 % (ref 11.5–15.5)
WBC Count: 6.1 10*3/uL (ref 4.0–10.5)
nRBC: 0 % (ref 0.0–0.2)

## 2022-06-30 MED ORDER — HEPARIN SOD (PORK) LOCK FLUSH 100 UNIT/ML IV SOLN
500.0000 [IU] | Freq: Once | INTRAVENOUS | Status: AC | PRN
  Administered 2022-06-30: 500 [IU]

## 2022-06-30 MED ORDER — SODIUM CHLORIDE 0.9 % IV SOLN
2.4000 mg/kg | Freq: Once | INTRAVENOUS | Status: AC
  Administered 2022-06-30: 160 mg via INTRAVENOUS
  Filled 2022-06-30: qty 8

## 2022-06-30 MED ORDER — PROCHLORPERAZINE MALEATE 10 MG PO TABS
10.0000 mg | ORAL_TABLET | Freq: Once | ORAL | Status: AC
  Administered 2022-06-30: 10 mg via ORAL
  Filled 2022-06-30: qty 1

## 2022-06-30 MED ORDER — DIPHENHYDRAMINE HCL 25 MG PO CAPS
25.0000 mg | ORAL_CAPSULE | Freq: Once | ORAL | Status: AC
  Administered 2022-06-30: 25 mg via ORAL
  Filled 2022-06-30: qty 1

## 2022-06-30 MED ORDER — SODIUM CHLORIDE 0.9% FLUSH
10.0000 mL | Freq: Once | INTRAVENOUS | Status: AC
  Administered 2022-06-30: 10 mL

## 2022-06-30 MED ORDER — SODIUM CHLORIDE 0.9% FLUSH
10.0000 mL | INTRAVENOUS | Status: DC | PRN
  Administered 2022-06-30: 10 mL

## 2022-06-30 MED ORDER — ACETAMINOPHEN 325 MG PO TABS
650.0000 mg | ORAL_TABLET | Freq: Once | ORAL | Status: AC
  Administered 2022-06-30: 650 mg via ORAL
  Filled 2022-06-30: qty 2

## 2022-06-30 MED ORDER — SODIUM CHLORIDE 0.9 % IV SOLN: Freq: Once | INTRAVENOUS | Status: AC

## 2022-06-30 NOTE — Patient Instructions (Signed)
[facility]  Discharge Instructions: Thank you for choosing [facility] to provide your oncology and hematology care.   If you have a lab appointment with the Cancer Center, please go directly to the Cancer Center and check in at the registration area.   Wear comfortable clothing and clothing appropriate for easy access to any Portacath or PICC line.   We strive to give you quality time with your provider. You may need to reschedule your appointment if you arrive late (15 or more minutes).  Arriving late affects you and other patients whose appointments are after yours.  Also, if you miss three or more appointments without notifying the office, you may be dismissed from the clinic at the provider's discretion.      For prescription refill requests, have your pharmacy contact our office and allow 72 hours for refills to be completed.    Today you received the following chemotherapy and/or immunotherapy agents: Kadcyla.       To help prevent nausea and vomiting after your treatment, we encourage you to take your nausea medication as directed.  BELOW ARE SYMPTOMS THAT SHOULD BE REPORTED IMMEDIATELY: *FEVER GREATER THAN 100.4 F (38 C) OR HIGHER *CHILLS OR SWEATING *NAUSEA AND VOMITING THAT IS NOT CONTROLLED WITH YOUR NAUSEA MEDICATION *UNUSUAL SHORTNESS OF BREATH *UNUSUAL BRUISING OR BLEEDING *URINARY PROBLEMS (pain or burning when urinating, or frequent urination) *BOWEL PROBLEMS (unusual diarrhea, constipation, pain near the anus) TENDERNESS IN MOUTH AND THROAT WITH OR WITHOUT PRESENCE OF ULCERS (sore throat, sores in mouth, or a toothache) UNUSUAL RASH, SWELLING OR PAIN  UNUSUAL VAGINAL DISCHARGE OR ITCHING   Items with * indicate a potential emergency and should be followed up as soon as possible or go to the Emergency Department if any problems should occur.  Please show the CHEMOTHERAPY ALERT CARD or IMMUNOTHERAPY ALERT CARD at  check-in to the Emergency Department and triage nurse.  Should you have questions after your visit or need to cancel or reschedule your appointment, please contact [facility]  Dept: [phone]  and follow the prompts.  Office hours are 8:00 a.m. to 4:30 p.m. Monday - Friday. Please note that voicemails left after 4:00 p.m. may not be returned until the following business day.  We are closed weekends and major holidays. You have access to a nurse at all times for urgent questions. Please call the main number to the clinic Dept: [phone] and follow the prompts.   For any non-urgent questions, you may also contact your provider using MyChart. We now offer e-Visits for anyone 18 and older to request care online for non-urgent symptoms. For details visit [email or web address].   Also download the MyChart app! Go to the app store, search "MyChart", open the app, select [facility], and log in with your MyChart username and password.   

## 2022-06-30 NOTE — Assessment & Plan Note (Addendum)
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 palpable right breast mass measuring about 7 cm x 5 and half centimeters, palpable right axillary lymph node with concern for skin invasion    Treatment Plan: 1. Neoadjuvant chemo with TCHP q 3 weeks X 6 followed by HP completed 07/07/2021 2. Bilateral mastectomies: Left mastectomy: Benign Right mastectomy: Residual microinvasive cancer status post neoadjuvant therapy, DCIS, 0/12 lymph nodes, ER +1%, PR negative, HER2 equivocal 2+ by IHC 3. Adj XRT completed 02/23/2021 4. Adj Anti-estrogen therapy with tamoxifen started 06/08/2021 discontinued 06/24/2021 (ER was only 1% therefore we felt risks outweigh benefits) 5. Neratinib: Because of her bowel issues she decided not to take it. --------------------------------------------------------------------------------------------------------- Hospitalization 05/18/2021-06/13/2021 (small bowel obstruction) Right chest palpable nodule: 1.2 cm mass at 10 o'clock position biopsy revealed grade 2 IDC with high-grade DCIS ER 20% positive weak staining, PR 0%, HER2 3+ positive, Ki-67 50%   05/02/2022: Right lumpectomy: Recurrent grade 3 IDC, posterior margin focally involved by cancer   Current treatment: Kadcyla cycle 3 Kadcyla toxicities: Mild intermittent diarrhea Fatigue that lasted 3 days Very mild nausea Hypokalemia: I instructed the patient to resume taking potassium supplement Chemotherapy-induced anemia: She will start taking iron tablets once a day.   Return to clinic 3 weeks for cycle 4

## 2022-07-03 ENCOUNTER — Telehealth: Payer: Self-pay | Admitting: Hematology and Oncology

## 2022-07-03 NOTE — Telephone Encounter (Signed)
Scheduled appointments per WQ. Left voicemail for patient.  

## 2022-07-07 ENCOUNTER — Telehealth: Payer: Self-pay

## 2022-07-07 ENCOUNTER — Encounter: Payer: Self-pay | Admitting: Hematology and Oncology

## 2022-07-07 NOTE — Telephone Encounter (Signed)
Called Pt regarding MyChart message. Pt reports congestion, chills, cough, and fatigue since yesterday. Pt states she cares for her grandson who has a cold but has not been swabbed for strep/flu/covid. Pt denies fever, difficulty breathing, dysuria, or dehydration. Pt asking if she can take cold medicine with tylenol. After speaking with Courtney Cave, PA, Pt should monitor for fever or worsening sx but is unlikely to have neutropenic fever. [name] advises Pt can take cold medicine with antipyretics and does not need to be worked up in [facility]. Message relayed to Pt who verbalized understanding.

## 2022-07-20 ENCOUNTER — Encounter: Payer: Self-pay | Admitting: *Deleted

## 2022-07-20 NOTE — Progress Notes (Signed)
Patient Care Team: Patient, [name] as PCP - General (General Practice) Pershing Proud, RN as Oncology Nurse Navigator Donnelly Angelica, RN as Oncology Nurse Navigator Romie Levee, MD as Consulting Physician (Colon and Rectal Surgery) Iva Boop, MD as Consulting Physician (Gastroenterology) Emelia Loron, MD as Consulting Physician (General Surgery) Serena Croissant, MD as Consulting Physician (Hematology and Oncology)  DIAGNOSIS:  Encounter Diagnosis  Name Primary?   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 palpable right breast mass measuring about 7 cm x 5 and half centimeters, palpable right axillary lymph node with concern for skin invasion    06/08/2020 Cancer Staging   Staging form: Breast, AJCC 8th Edition - Clinical: Stage IIIB (cT4d, cN1, cM0, G3, ER+, PR-, HER2+) - Signed by Serena Croissant, MD on 07/07/2020 Histologic grading system: 3 grade system   06/24/2020 Genetic Testing   No pathogenic variants detected in [facility] [employer]Can[employer] +RNAinsight.  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  The report date is June 24, 2020.   The CustomNext-Cancer+RNAinsight panel offered by Karna Dupes includes sequencing and rearrangement analysis for the following 47 genes:  APC, ATM, AXIN2, BARD1, BMPR1A, BRCA1, BRCA2, BRIP1, CDH1, CDK4, CDKN2A, CHEK2, DICER1, EPCAM, GREM1, HOXB13, MEN1, MLH1, MSH2, MSH3, MSH6, MUTYH, NBN, NF1, NF2, NTHL1, PALB2,  PMS2, POLD1, POLE, PTEN, RAD51C, RAD51D, RECQL, RET, SDHA, SDHAF2, SDHB, SDHC, SDHD, SMAD4, SMARCA4, STK11, TP53, TSC1, TSC2, and VHL.  RNA data is routinely analyzed for use in variant interpretation for all genes.   06/25/2020 - 10/29/2020 Chemotherapy   TCHP x6 cycles   11/09/2020 Surgery   Bilateral mastectomies: Left mastectomy: Benign Right mastectomy: Residual microinvasive cancer status post neoadjuvant therapy, DCIS, 0/12 lymph nodes, ER +1%, PR negative, HER2 equivocal 2+ by [facility]   11/19/2020 - 07/15/2021 Chemotherapy   Patient is on Treatment Plan : BREAST Trastuzumab  + Pertuzumab q21d x 13 cycles     01/11/2021 - 03/09/2021 Radiation Therapy   Site Technique Total Dose (Gy) Dose per Fx (Gy) Completed Fx Beam Energies  Chest Wall, Right: CW_Rt 3D 50.4/50.4 1.8 28/28 10X  Chest Wall, Right: CW_Rt_SCLV 3D 50.4/50.4 1.8 28/28 6X, 10X  Chest Wall, Right: CW_Rt_Bst Electron 10/10 2 5/5 6E     06/08/2021 - 06/24/2021 Anti-estrogen oral therapy   Tamoxifen (discontinued)   03/24/2022 Relapse/Recurrence   Right chest palpable nodule: 1.2 cm mass at 10 o'clock position biopsy revealed grade 2 IDC with high-grade DCIS ER 20% positive weak staining, PR 0%, HER2 3+ positive, Ki-67 50%   04/06/2022 Imaging   CT chest/abd/pelvis  IMPRESSION: 1. No evidence of metastatic disease in the chest, abdomen or pelvis. 2. Similar probable postradiation change in the peripheral anterior right upper lobe, anterior right middle lobe, lateral right lower lobe and right lung apex. 3. Surgical changes of subtotal colectomy with short segment wall thickening of the neo terminal ileum and short segments of small bowel throughout the abdomen pelvis, with interposed gas and fluid-filled distended loops of small  bowel intermixed between the areas of inflammation. Findings are compatible active inflammatory Crohn's enteritis. 4. Small volume right lower quadrant and pelvic free fluid, likely reactive.      Electronically Signed   By: Maudry Mayhew M.D.   On: 04/06/2022 12:53     05/02/2022 Surgery   Right lumpectomy: Recurrent grade 3 IDC, posterior margin focally involved by cancer.   05/17/2022 -  Chemotherapy   Patient is on Treatment Plan : BREAST ADO-Trastuzumab Emtansine (Kadcyla) q21d       CHIEF COMPLIANT: Cycle 4 Kadcyla   INTERVAL HISTORY: [name] is a 47 y.o. with above-mentioned history of HER-2 positive right breast cancer currently on Kadcyla maintenance. She presents to the clinic today for follow-up. She reports that she didn't have diarrhea this go round. She does still have fatigue. She has been taking potassium medication and her iron pills, to help with potassium and anemia.   ALLERGIES:  is allergic to nsaids and other.  MEDICATIONS:  Current Outpatient Medications  Medication Sig Dispense Refill   acetaminophen (TYLENOL) 500 MG tablet Take 500-1,000 mg by mouth every 6 (six) hours as needed for mild pain.     lidocaine-prilocaine (EMLA) cream Apply to affected area once 30 g 3   Multiple Vitamins-Minerals (MULTIVITAMIN WITH MINERALS) tablet Take 1 tablet by mouth daily.     ondansetron (ZOFRAN) 8 MG tablet Take 1 tablet (8 mg total) by mouth every 8 (eight) hours as needed for nausea or vomiting. 30 tablet 1   potassium chloride SA (KLOR-CON M) 20 MEQ tablet Take 1 tablet (20 mEq total) by mouth daily. 90 tablet 2   prochlorperazine (COMPAZINE) 10 MG tablet Take 1 tablet (10 mg total) by mouth every 6 (six) hours as needed for nausea or vomiting. 30 tablet 1   vedolizumab (ENTYVIO) 300 MG injection Inject into the vein.     No current facility-administered medications for this visit.    PHYSICAL EXAMINATION: ECOG PERFORMANCE STATUS: 1 - Symptomatic but completely ambulatory  Vitals:   07/21/22 0909  BP: 130/81  Pulse: (!) 112  Resp: 18  Temp: 97.7 F (36.5 C)  SpO2: 100%   Filed Weights   07/21/22 0909  Weight: 133 lb 9.6 oz (60.6 kg)      LABORATORY DATA:  I have reviewed the data as listed    Latest Ref Rng & Units 06/30/2022    9:27 AM 06/09/2022   10:03 AM 05/24/2022    9:27 AM  CMP  Glucose 70 - 99 mg/dL 161  096  045   BUN 6 - 20 mg/dL Creatinine 0.44 - 1.00 mg/dL 4.09  8.11  9.14   Sodium 135 - 145 mmol/L 139  137  142   Potassium 3.5 - 5.1 mmol/L 3.4  3.1  3.2   Chloride 98 - 111 mmol/L 106  105  106   CO2 22 - 32 mmol/L Calcium 8.9 - 10.3 mg/dL 8.9  8.9  9.1   Total Protein 6.5 - 8.1 g/dL 6.6  7.3  7.2   Total Bilirubin 0.3 - 1.2 mg/dL 0.4  0.9  0.5   Alkaline Phos 38 - 126 U/L 107  104  137   AST 15 - 41 U/L 21  27  40   ALT 0 - 44 U/L Lab Results  Component Value Date   WBC 7.6  07/21/2022   HGB 10.0 (L) 07/21/2022   HCT 31.3 (L) 07/21/2022   MCV 81.7 07/21/2022   PLT 335 07/21/2022   NEUTROABS 5.1 07/21/2022    ASSESSMENT & PLAN:  Malignant neoplasm of upper-outer quadrant of right breast in female, estrogen receptor positive ([facility]) 06/03/2020, US guided biopsy of the right breast 9 0 clock mass showed grade III IDC; Prognostics showed ER 40% positive, weak staining, PR positive 0 %, Her 2 3 +, Ki 67 30% 4dCN1M0 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 palpable right breast mass measuring about 7 cm x 5 and half centimeters, palpable right axillary lymph node with concern for skin invasion    Treatment Plan: 1. Neoadjuvant chemo with TCHP q 3 weeks X 6 followed by HP completed 07/07/2021 2. Bilateral mastectomies: Left mastectomy: Benign Right mastectomy: Residual microinvasive cancer status post neoadjuvant therapy, DCIS, 0/12 lymph nodes, ER +1%, PR negative, HER2 equivocal 2+ by IHC 3. Adj XRT completed 02/23/2021 4. Adj Anti-estrogen therapy with tamoxifen started 06/08/2021 discontinued 06/24/2021 (ER was only 1% therefore we felt risks outweigh benefits) 5. Neratinib: Because of her bowel issues she decided not to take  it. --------------------------------------------------------------------------------------------------------- Hospitalization 05/18/2021-06/13/2021 (small bowel obstruction) Right chest palpable nodule: 1.2 cm mass at 10 o'clock position biopsy revealed grade 2 IDC with high-grade DCIS ER 20% positive weak staining, PR 0%, HER2 3+ positive, Ki-67 50%   05/02/2022: Right lumpectomy: Recurrent grade 3 IDC, posterior margin focally involved by cancer   Current treatment: Kadcyla cycle 4 Kadcyla toxicities: Diarrhea subsided Fatigue that lasted 3 days Hypokalemia: Currently on potassium supplement Chemotherapy-induced anemia: Taking iron tablet once a day today's hemoglobin is 10.  Monitoring closely. She would like to move her next treatment appointment from May 3 to May 2. Return to clinic 3 weeks for cycle 5    No orders of the defined types were placed in this encounter.  The patient has a good understanding of the overall plan. she agrees with it. she will call with any problems that may develop before the next visit here. Total time spent: 30 mins including face to face time and time spent for planning, charting and co-ordination of care   Tamsen MeekViinay K Caileigh Canche, MD 07/21/22    I Janan Ridgeeritra, Mcnairy am acting as a Neurosurgeonscribe for [employer]Blakeleigh Domek  I have reviewed the above documentation for accuracy and completeness, and I agree with the above.

## 2022-07-21 ENCOUNTER — Inpatient Hospital Stay (HOSPITAL_BASED_OUTPATIENT_CLINIC_OR_DEPARTMENT_OTHER): Payer: Managed Care, Other (non HMO) | Admitting: Hematology and Oncology

## 2022-07-21 ENCOUNTER — Inpatient Hospital Stay: Payer: Managed Care, Other (non HMO) | Attending: Hematology and Oncology

## 2022-07-21 ENCOUNTER — Inpatient Hospital Stay: Payer: Managed Care, Other (non HMO)

## 2022-07-21 VITALS — BP 130/81 | HR 112 | Temp 97.7°F | Resp 18 | Ht 64.0 in | Wt 133.6 lb

## 2022-07-21 VITALS — BP 110/66 | HR 103 | Temp 97.8°F | Resp 16

## 2022-07-21 DIAGNOSIS — Z170 Estrogen receptor positive status [ER+]: Secondary | ICD-10-CM | POA: Diagnosis not present

## 2022-07-21 DIAGNOSIS — E876 Hypokalemia: Secondary | ICD-10-CM | POA: Insufficient documentation

## 2022-07-21 DIAGNOSIS — Z5112 Encounter for antineoplastic immunotherapy: Secondary | ICD-10-CM | POA: Diagnosis not present

## 2022-07-21 DIAGNOSIS — C50411 Malignant neoplasm of upper-outer quadrant of right female breast: Secondary | ICD-10-CM | POA: Insufficient documentation

## 2022-07-21 DIAGNOSIS — D6481 Anemia due to antineoplastic chemotherapy: Secondary | ICD-10-CM | POA: Insufficient documentation

## 2022-07-21 DIAGNOSIS — Z95828 Presence of other vascular implants and grafts: Secondary | ICD-10-CM

## 2022-07-21 LAB — CBC WITH DIFFERENTIAL (CANCER CENTER ONLY)
Abs Immature Granulocytes: 0.02 10*3/uL (ref 0.00–0.07)
Basophils Absolute: 0 10*3/uL (ref 0.0–0.1)
Basophils Relative: 1 %
Eosinophils Absolute: 0.1 10*3/uL (ref 0.0–0.5)
Eosinophils Relative: 1 %
HCT: 31.3 % — ABNORMAL LOW (ref 36.0–46.0)
Hemoglobin: 10 g/dL — ABNORMAL LOW (ref 12.0–15.0)
Immature Granulocytes: 0 %
Lymphocytes Relative: 23 %
Lymphs Abs: 1.8 10*3/uL (ref 0.7–4.0)
MCH: 26.1 pg (ref 26.0–34.0)
MCHC: 31.9 g/dL (ref 30.0–36.0)
MCV: 81.7 fL (ref 80.0–100.0)
Monocytes Absolute: 0.6 10*3/uL (ref 0.1–1.0)
Monocytes Relative: 7 %
Neutro Abs: 5.1 10*3/uL (ref 1.7–7.7)
Neutrophils Relative %: 68 %
Platelet Count: 335 10*3/uL (ref 150–400)
RBC: 3.83 MIL/uL — ABNORMAL LOW (ref 3.87–5.11)
RDW: 14.7 % (ref 11.5–15.5)
WBC Count: 7.6 10*3/uL (ref 4.0–10.5)
nRBC: 0 % (ref 0.0–0.2)

## 2022-07-21 LAB — CMP (CANCER CENTER ONLY)
ALT: 18 U/L (ref 0–44)
AST: 25 U/L (ref 15–41)
Albumin: 3.8 g/dL (ref 3.5–5.0)
Alkaline Phosphatase: 102 U/L (ref 38–126)
Anion gap: 7 (ref 5–15)
BUN: 15 mg/dL (ref 6–20)
CO2: 30 mmol/L (ref 22–32)
Calcium: 9.4 mg/dL (ref 8.9–10.3)
Chloride: 105 mmol/L (ref 98–111)
Creatinine: 1.05 mg/dL — ABNORMAL HIGH (ref 0.44–1.00)
GFR, Estimated: >60 mL/min (ref >60)
Glucose, Bld: 86 mg/dL (ref 70–99)
Potassium: 3.7 mmol/L (ref 3.5–5.1)
Sodium: 142 mmol/L (ref 135–145)
Total Bilirubin: 0.3 mg/dL (ref 0.3–1.2)
Total Protein: 7.4 g/dL (ref 6.5–8.1)

## 2022-07-21 MED ORDER — SODIUM CHLORIDE 0.9% FLUSH
10.0000 mL | INTRAVENOUS | Status: DC | PRN
  Administered 2022-07-21: 10 mL

## 2022-07-21 MED ORDER — ACETAMINOPHEN 325 MG PO TABS
650.0000 mg | ORAL_TABLET | Freq: Once | ORAL | Status: AC
  Administered 2022-07-21: 650 mg via ORAL
  Filled 2022-07-21: qty 2

## 2022-07-21 MED ORDER — PROCHLORPERAZINE MALEATE 10 MG PO TABS
10.0000 mg | ORAL_TABLET | Freq: Once | ORAL | Status: AC
  Administered 2022-07-21: 10 mg via ORAL
  Filled 2022-07-21: qty 1

## 2022-07-21 MED ORDER — SODIUM CHLORIDE 0.9 % IV SOLN
2.4000 mg/kg | Freq: Once | INTRAVENOUS | Status: AC
  Administered 2022-07-21: 160 mg via INTRAVENOUS
  Filled 2022-07-21: qty 8

## 2022-07-21 MED ORDER — SODIUM CHLORIDE 0.9 % IV SOLN: Freq: Once | INTRAVENOUS | Status: AC

## 2022-07-21 MED ORDER — DIPHENHYDRAMINE HCL 25 MG PO CAPS
25.0000 mg | ORAL_CAPSULE | Freq: Once | ORAL | Status: AC
  Administered 2022-07-21: 25 mg via ORAL
  Filled 2022-07-21: qty 1

## 2022-07-21 MED ORDER — SODIUM CHLORIDE 0.9% FLUSH
10.0000 mL | Freq: Once | INTRAVENOUS | Status: AC
  Administered 2022-07-21: 10 mL

## 2022-07-21 MED ORDER — HEPARIN SOD (PORK) LOCK FLUSH 100 UNIT/ML IV SOLN
500.0000 [IU] | Freq: Once | INTRAVENOUS | Status: AC | PRN
  Administered 2022-07-21: 500 [IU]

## 2022-07-21 NOTE — Assessment & Plan Note (Signed)
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 palpable right breast mass measuring about 7 cm x 5 and half centimeters, palpable right axillary lymph node with concern for skin invasion    Treatment Plan: 1. Neoadjuvant chemo with TCHP q 3 weeks X 6 followed by HP completed 07/07/2021 2. Bilateral mastectomies: Left mastectomy: Benign Right mastectomy: Residual microinvasive cancer status post neoadjuvant therapy, DCIS, 0/12 lymph nodes, ER +1%, PR negative, HER2 equivocal 2+ by IHC 3. Adj XRT completed 02/23/2021 4. Adj Anti-estrogen therapy with tamoxifen started 06/08/2021 discontinued 06/24/2021 (ER was only 1% therefore we felt risks outweigh benefits) 5. Neratinib: Because of her bowel issues she decided not to take it. --------------------------------------------------------------------------------------------------------- Hospitalization 05/18/2021-06/13/2021 (small bowel obstruction) Right chest palpable nodule: 1.2 cm mass at 10 o'clock position biopsy revealed grade 2 IDC with high-grade DCIS ER 20% positive weak staining, PR 0%, HER2 3+ positive, Ki-67 50%   05/02/2022: Right lumpectomy: Recurrent grade 3 IDC, posterior margin focally involved by cancer   Current treatment: Kadcyla cycle 4 Kadcyla toxicities: Mild intermittent diarrhea Fatigue that lasted 3 days Very mild nausea Hypokalemia: I instructed the patient to resume taking potassium supplement Chemotherapy-induced anemia: She will start taking iron tablets once a day.   Return to clinic 3 weeks for cycle 5

## 2022-07-21 NOTE — Progress Notes (Signed)
Per Dr. Pamelia Hoit, OK to proceed with treatment with HR 102.

## 2022-07-27 ENCOUNTER — Telehealth: Payer: Self-pay | Admitting: Hematology and Oncology

## 2022-07-27 NOTE — Telephone Encounter (Signed)
Patient called to reschedule next tx appointments due to changes in work schedule. Patient rescheduled and notified. Patient prefers later appointments for future.

## 2022-08-01 ENCOUNTER — Encounter: Payer: Self-pay | Admitting: Hematology and Oncology

## 2022-08-02 ENCOUNTER — Encounter (HOSPITAL_BASED_OUTPATIENT_CLINIC_OR_DEPARTMENT_OTHER): Payer: Self-pay

## 2022-08-07 ENCOUNTER — Other Ambulatory Visit: Payer: Self-pay

## 2022-08-07 ENCOUNTER — Ambulatory Visit (HOSPITAL_BASED_OUTPATIENT_CLINIC_OR_DEPARTMENT_OTHER)
Admission: RE | Admit: 2022-08-07 | Discharge: 2022-08-07 | Disposition: A | Discharge status: Home / Self Care | Payer: Managed Care, Other (non HMO) | Attending: Plastic Surgery | Admitting: Plastic Surgery

## 2022-08-07 ENCOUNTER — Ambulatory Visit (HOSPITAL_BASED_OUTPATIENT_CLINIC_OR_DEPARTMENT_OTHER): Payer: Managed Care, Other (non HMO) | Admitting: Certified Registered"

## 2022-08-07 ENCOUNTER — Encounter (HOSPITAL_BASED_OUTPATIENT_CLINIC_OR_DEPARTMENT_OTHER): Payer: Self-pay

## 2022-08-07 ENCOUNTER — Encounter (HOSPITAL_BASED_OUTPATIENT_CLINIC_OR_DEPARTMENT_OTHER)
Admission: RE | Disposition: A | Discharge status: Home / Self Care | Payer: Self-pay | Source: Home / Self Care | Attending: Plastic Surgery

## 2022-08-07 DIAGNOSIS — T859XXA Unspecified complication of internal prosthetic device, implant and graft, initial encounter: Secondary | ICD-10-CM | POA: Diagnosis present

## 2022-08-07 DIAGNOSIS — Z01818 Encounter for other preprocedural examination: Secondary | ICD-10-CM

## 2022-08-07 DIAGNOSIS — Y831 Surgical operation with implant of artificial internal device as the cause of abnormal reaction of the patient, or of later complication, without mention of misadventure at the time of the procedure: Secondary | ICD-10-CM | POA: Insufficient documentation

## 2022-08-07 DIAGNOSIS — Z87891 Personal history of nicotine dependence: Secondary | ICD-10-CM

## 2022-08-07 DIAGNOSIS — T8549XA Other mechanical complication of breast prosthesis and implant, initial encounter: Secondary | ICD-10-CM | POA: Insufficient documentation

## 2022-08-07 DIAGNOSIS — Z45811 Encounter for adjustment or removal of right breast implant: Secondary | ICD-10-CM

## 2022-08-07 DIAGNOSIS — K5090 Crohn's disease, unspecified, without complications: Secondary | ICD-10-CM

## 2022-08-07 DIAGNOSIS — Z803 Family history of malignant neoplasm of breast: Secondary | ICD-10-CM | POA: Diagnosis not present

## 2022-08-07 HISTORY — PX: BREAST IMPLANT REMOVAL: SHX5361

## 2022-08-07 LAB — NO BLOOD PRODUCTS

## 2022-08-07 SURGERY — REMOVAL, IMPLANT, BREAST
Anesthesia: General | Site: Breast | Laterality: Right

## 2022-08-07 MED ORDER — LACTATED RINGERS IV SOLN: INTRAVENOUS | Status: DC | PRN

## 2022-08-07 MED ORDER — MIDAZOLAM HCL 2 MG/2ML IJ SOLN
INTRAMUSCULAR | Status: DC | PRN
  Administered 2022-08-07: 2 mg via INTRAVENOUS

## 2022-08-07 MED ORDER — PHENYLEPHRINE HCL (PRESSORS) 10 MG/ML IV SOLN
INTRAVENOUS | Status: DC | PRN
  Administered 2022-08-07 (×10): 160 ug via INTRAVENOUS

## 2022-08-07 MED ORDER — FENTANYL CITRATE (PF) 100 MCG/2ML IJ SOLN
INTRAMUSCULAR | Status: AC
  Filled 2022-08-07: qty 2

## 2022-08-07 MED ORDER — FENTANYL CITRATE (PF) 100 MCG/2ML IJ SOLN
INTRAMUSCULAR | Status: DC | PRN
  Administered 2022-08-07: 50 ug via INTRAVENOUS
  Administered 2022-08-07: 25 ug via INTRAVENOUS

## 2022-08-07 MED ORDER — LACTATED RINGERS IV SOLN: INTRAVENOUS | Status: DC

## 2022-08-07 MED ORDER — LIDOCAINE HCL (CARDIAC) PF 100 MG/5ML IV SOSY
PREFILLED_SYRINGE | INTRAVENOUS | Status: DC | PRN
  Administered 2022-08-07: 60 mg via INTRAVENOUS

## 2022-08-07 MED ORDER — CHLORHEXIDINE GLUCONATE CLOTH 2 % EX PADS: 6.0000 | MEDICATED_PAD | Freq: Once | CUTANEOUS | Status: DC

## 2022-08-07 MED ORDER — OXYCODONE HCL 5 MG/5ML PO SOLN: 5.0000 mg | Freq: Once | ORAL | Status: AC | PRN

## 2022-08-07 MED ORDER — CEFAZOLIN SODIUM-DEXTROSE 2-3 GM-%(50ML) IV SOLR
INTRAVENOUS | Status: DC | PRN
  Administered 2022-08-07: 2 g via INTRAVENOUS

## 2022-08-07 MED ORDER — SODIUM CHLORIDE 0.9 % IV SOLN: INTRAVENOUS | Status: DC | PRN

## 2022-08-07 MED ORDER — PROPOFOL 10 MG/ML IV BOLUS
INTRAVENOUS | Status: AC
  Filled 2022-08-07: qty 20

## 2022-08-07 MED ORDER — PROPOFOL 500 MG/50ML IV EMUL
INTRAVENOUS | Status: DC | PRN
  Administered 2022-08-07: 35 ug/kg/min via INTRAVENOUS

## 2022-08-07 MED ORDER — PROPOFOL 10 MG/ML IV BOLUS
INTRAVENOUS | Status: DC | PRN
  Administered 2022-08-07: 200 mg via INTRAVENOUS

## 2022-08-07 MED ORDER — ONDANSETRON HCL 4 MG/2ML IJ SOLN: 4.0000 mg | Freq: Four times a day (QID) | INTRAMUSCULAR | Status: DC | PRN

## 2022-08-07 MED ORDER — SODIUM CHLORIDE 0.9 % IV SOLN
INTRAVENOUS | Status: AC
  Filled 2022-08-07: qty 10

## 2022-08-07 MED ORDER — MIDAZOLAM HCL 2 MG/2ML IJ SOLN
INTRAMUSCULAR | Status: AC
  Filled 2022-08-07: qty 2

## 2022-08-07 MED ORDER — BUPIVACAINE-EPINEPHRINE 0.25% -1:200000 IJ SOLN
INTRAMUSCULAR | Status: DC | PRN
  Administered 2022-08-07: 20 mL

## 2022-08-07 MED ORDER — OXYCODONE HCL 5 MG PO TABS
ORAL_TABLET | ORAL | Status: AC
  Filled 2022-08-07: qty 1

## 2022-08-07 MED ORDER — FENTANYL CITRATE (PF) 100 MCG/2ML IJ SOLN
25.0000 ug | INTRAMUSCULAR | Status: DC | PRN
  Administered 2022-08-07 (×2): 50 ug via INTRAVENOUS

## 2022-08-07 MED ORDER — LIDOCAINE 2% (20 MG/ML) 5 ML SYRINGE
INTRAMUSCULAR | Status: AC
  Filled 2022-08-07: qty 5

## 2022-08-07 MED ORDER — DEXAMETHASONE SODIUM PHOSPHATE 10 MG/ML IJ SOLN
INTRAMUSCULAR | Status: AC
  Filled 2022-08-07: qty 1

## 2022-08-07 MED ORDER — CEFAZOLIN SODIUM-DEXTROSE 2-4 GM/100ML-% IV SOLN
2.0000 g | INTRAVENOUS | Status: AC
  Administered 2022-08-07: 2 g via INTRAVENOUS

## 2022-08-07 MED ORDER — ONDANSETRON HCL 4 MG/2ML IJ SOLN
INTRAMUSCULAR | Status: AC
  Filled 2022-08-07: qty 2

## 2022-08-07 MED ORDER — OXYCODONE HCL 5 MG PO TABS
5.0000 mg | ORAL_TABLET | Freq: Once | ORAL | Status: AC | PRN
  Administered 2022-08-07: 5 mg via ORAL

## 2022-08-07 MED ORDER — CEFAZOLIN SODIUM-DEXTROSE 2-4 GM/100ML-% IV SOLN
INTRAVENOUS | Status: AC
  Filled 2022-08-07: qty 100

## 2022-08-07 MED ORDER — ONDANSETRON HCL 4 MG/2ML IJ SOLN
INTRAMUSCULAR | Status: DC | PRN
  Administered 2022-08-07: 4 mg via INTRAVENOUS

## 2022-08-07 SURGICAL SUPPLY — 59 items
APL PRP STRL LF DISP 70% ISPRP (MISCELLANEOUS) ×1
APL SKNCLS STERI-STRIP NONHPOA (GAUZE/BANDAGES/DRESSINGS)
BAG DECANTER FOR FLEXI CONT (MISCELLANEOUS) IMPLANT
BENZOIN TINCTURE PRP APPL 2/3 (GAUZE/BANDAGES/DRESSINGS) ×1 IMPLANT
BIOPATCH RED 1 DISK 7.0 (GAUZE/BANDAGES/DRESSINGS) IMPLANT
BLADE SURG 10 STRL SS (BLADE) ×1 IMPLANT
BLADE SURG 15 STRL LF DISP TIS (BLADE) IMPLANT
BLADE SURG 15 STRL SS (BLADE)
BNDG CMPR MED 10X6 ELC LF (GAUZE/BANDAGES/DRESSINGS)
BNDG ELASTIC 6X10 VLCR STRL LF (GAUZE/BANDAGES/DRESSINGS) IMPLANT
CANISTER SUCT 1200ML W/VALVE (MISCELLANEOUS) ×1 IMPLANT
CHLORAPREP W/TINT 26 (MISCELLANEOUS) ×1 IMPLANT
COVER BACK TABLE 60X90IN (DRAPES) ×1 IMPLANT
COVER MAYO STAND STRL (DRAPES) ×1 IMPLANT
DRAIN CHANNEL 15F RND FF W/TCR (WOUND CARE) IMPLANT
DRAPE UTILITY XL STRL (DRAPES) ×1 IMPLANT
DRSG TEGADERM 2-3/8X2-3/4 SM (GAUZE/BANDAGES/DRESSINGS) IMPLANT
ELECT BLADE 4.0 EZ CLEAN MEGAD (MISCELLANEOUS)
ELECT BLADE 6.5 EXT (BLADE) IMPLANT
ELECT COATED BLADE 2.86 ST (ELECTRODE) ×1 IMPLANT
ELECT REM PT RETURN 9FT ADLT (ELECTROSURGICAL) ×1
ELECTRODE BLDE 4.0 EZ CLN MEGD (MISCELLANEOUS) IMPLANT
ELECTRODE REM PT RTRN 9FT ADLT (ELECTROSURGICAL) ×1 IMPLANT
EVACUATOR SILICONE 100CC (DRAIN) IMPLANT
GAUZE PAD ABD 8X10 STRL (GAUZE/BANDAGES/DRESSINGS) ×1 IMPLANT
GAUZE SPONGE 4X4 12PLY STRL (GAUZE/BANDAGES/DRESSINGS) ×1 IMPLANT
GLOVE BIO SURGEON STRL SZ7.5 (GLOVE) IMPLANT
GLOVE BIOGEL M STRL SZ7.5 (GLOVE) ×1 IMPLANT
GLOVE BIOGEL PI IND STRL 8 (GLOVE) ×1 IMPLANT
GOWN STRL REUS W/ TWL LRG LVL3 (GOWN DISPOSABLE) ×2 IMPLANT
GOWN STRL REUS W/TWL LRG LVL3 (GOWN DISPOSABLE) ×2
NDL SPNL 18GX3.5 QUINCKE PK (NEEDLE) IMPLANT
NEEDLE SPNL 18GX3.5 QUINCKE PK (NEEDLE) IMPLANT
NS IRRIG 1000ML POUR BTL (IV SOLUTION) IMPLANT
PACK BASIN DAY SURGERY FS (CUSTOM PROCEDURE TRAY) ×1 IMPLANT
PENCIL SMOKE EVACUATOR (MISCELLANEOUS) ×1 IMPLANT
PIN SAFETY STERILE (MISCELLANEOUS) IMPLANT
SLEEVE SCD COMPRESS KNEE MED (STOCKING) ×1 IMPLANT
SPIKE FLUID TRANSFER (MISCELLANEOUS) IMPLANT
SPONGE T-LAP 18X18 ~~LOC~~+RFID (SPONGE) ×2 IMPLANT
STAPLER INSORB 30 2030 C-SECTI (MISCELLANEOUS) ×1 IMPLANT
STAPLER VISISTAT 35W (STAPLE) ×1 IMPLANT
STRIP CLOSURE SKIN 1/2X4 (GAUZE/BANDAGES/DRESSINGS) IMPLANT
SUT ETHILON 3 0 PS 1 (SUTURE) IMPLANT
SUT MNCRL AB 4-0 PS2 18 (SUTURE) IMPLANT
SUT PDS 3-0 CT2 (SUTURE) ×1
SUT PDS II 3-0 CT2 27 ABS (SUTURE) IMPLANT
SUT VIC AB 3-0 PS1 18 (SUTURE)
SUT VIC AB 3-0 PS1 18XBRD (SUTURE) IMPLANT
SUT VIC AB 3-0 SH 27 (SUTURE)
SUT VIC AB 3-0 SH 27X BRD (SUTURE) IMPLANT
SUT VIC AB 4-0 PS2 18 (SUTURE) IMPLANT
SWAB COLLECTION DEVICE MRSA (MISCELLANEOUS) IMPLANT
SWAB CULTURE ESWAB REG 1ML (MISCELLANEOUS) IMPLANT
SYR BULB IRRIG 60ML STRL (SYRINGE) ×1 IMPLANT
TOWEL GREEN STERILE FF (TOWEL DISPOSABLE) ×1 IMPLANT
TUBE CONNECTING 20X1/4 (TUBING) ×1 IMPLANT
UNDERPAD 30X36 HEAVY ABSORB (UNDERPADS AND DIAPERS) ×2 IMPLANT
YANKAUER SUCT BULB TIP NO VENT (SUCTIONS) ×1 IMPLANT

## 2022-08-07 NOTE — Anesthesia Preprocedure Evaluation (Signed)
Anesthesia Evaluation  Patient identified by MRN, date of birth, ID band Patient awake    Reviewed: Allergy & Precautions, H&P , NPO status , Patient's Chart, lab work & pertinent test results  Airway Mallampati: II   Neck ROM: full    Dental   Pulmonary former smoker   breath sounds clear to auscultation       Cardiovascular negative cardio ROS  Rhythm:regular Rate:Normal     Neuro/Psych    GI/Hepatic Crohn's dz   Endo/Other    Renal/GU      Musculoskeletal   Abdominal   Peds  Hematology   Anesthesia Other Findings   Reproductive/Obstetrics                             Anesthesia Physical Anesthesia Plan  ASA: 2  Anesthesia Plan: General   Post-op Pain Management:    Induction: Intravenous  PONV Risk Score and Plan: 3 and Dexamethasone, Midazolam, Treatment may vary due to age or medical condition and Ondansetron  Airway Management Planned: LMA  Additional Equipment:   Intra-op Plan:   Post-operative Plan: Extubation in OR  Informed Consent: I have reviewed the patients History and Physical, chart, labs and discussed the procedure including the risks, benefits and alternatives for the proposed anesthesia with the patient or authorized representative who has indicated his/her understanding and acceptance.     Dental advisory given  Plan Discussed with: CRNA, Anesthesiologist and Surgeon  Anesthesia Plan Comments:        Anesthesia Quick Evaluation

## 2022-08-07 NOTE — Anesthesia Procedure Notes (Signed)
Procedure Name: LMA Insertion Date/Time: 08/07/2022 1:50 PM  Performed by: Karen Kitchens, CRNAPre-anesthesia Checklist: Patient identified, Emergency Drugs available, Suction available and Patient being monitored Patient Re-evaluated:Patient Re-evaluated prior to induction Oxygen Delivery Method: Circle system utilized Preoxygenation: Pre-oxygenation with 100% oxygen Induction Type: IV induction Ventilation: Mask ventilation without difficulty LMA: LMA inserted LMA Size: 4.0 Number of attempts: 1 Airway Equipment and Method: Bite block Placement Confirmation: positive ETCO2, CO2 detector and breath sounds checked- equal and bilateral Tube secured with: Tape Dental Injury: Teeth and Oropharynx as per pre-operative assessment

## 2022-08-07 NOTE — H&P (Signed)
[name] is an 47 y.o. female.   Chief Complaint: Exposed implant   HPI: Pt has exposed implant.  Here for removal and closure.  Past Medical History:  Diagnosis Date   Cancer [facility])    Colon stricture (HCC) 05/07/2019   Crohn's colitis, with intestinal obstruction (HCC) 09/17/2013   Dx 2006 - right and left colon involved 2011 - pan-colitis 09/17/2013 left colitis 60-20 cm March 2021 subtotal colectomy ileosigmoid anastomosis for colonic strictures-Dr. Maisie Fus     Crohn's disease [facility])    Family history of breast cancer 06/11/2020   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 10/17/2010   Rectal bleeding    Uterine fibroid    Vitamin D deficiency 09/24/2013    Past Surgical History:  Procedure Laterality Date   AXILLARY LYMPH NODE DISSECTION Right 11/09/2020   Procedure: RIGHT AXILLARY LYMPH NODE DISSECTION;  Surgeon: Emelia Loron, MD;  Location: [facility];  Service: General;  Laterality: Right;   BIOPSY  07/01/2020   Procedure: BIOPSY;  Surgeon: Benancio Deeds, MD;  Location: [facility];  Service: Gastroenterology;;   BIOPSY  01/27/2021   Procedure: BIOPSY;  Surgeon: Benancio Deeds, MD;  Location: [facility];  Service: Gastroenterology;;   BIOPSY  09/07/2021   Procedure: BIOPSY;  Surgeon: Lynann Bologna, MD;  Location: [facility];  Service: Gastroenterology;;   BREAST BIOPSY Right 03/24/2022   [place] RT BREAST BX W LOC DEV 1ST LESION IMG BX SPEC US GUIDE 03/24/2022 [facility]   BREAST BIOPSY  04/28/2022   [place] RT RADIOACTIVE SEED LOC 04/28/2022 [facility]   BREAST RECONSTRUCTION WITH PLACEMENT OF TISSUE EXPANDER AND FLEX HD (ACELLULAR HYDRATED DERMIS) Bilateral 11/09/2020   Procedure: BILATERAL BREAST RECONSTRUCTION WITH PLACEMENT OF TISSUE EXPANDER AND FLEX HD (ACELLULAR HYDRATED DERMIS);  Surgeon: Allena Napoleon, MD;  Location: [facility];  Service: Plastics;  Laterality: Bilateral;    CAPSULOTOMY Bilateral 07/25/2021   Procedure: CAPSULOTOMY;  Surgeon: Allena Napoleon, MD;  Location: [facility];  Service: Plastics;  Laterality: Bilateral;   COLECTOMY  06/27/2019   COLONOSCOPY  2015   FLEXIBLE SIGMOIDOSCOPY N/A 07/01/2020   Procedure: FLEXIBLE SIGMOIDOSCOPY;  Surgeon: Benancio Deeds, MD;  Location: [facility];  Service: Gastroenterology;  Laterality: N/A;   FLEXIBLE SIGMOIDOSCOPY N/A 01/27/2021   Procedure: FLEXIBLE SIGMOIDOSCOPY;  Surgeon: Benancio Deeds, MD;  Location: [facility];  Service: Gastroenterology;  Laterality: N/A;   FLEXIBLE SIGMOIDOSCOPY N/A 09/07/2021   Procedure: FLEXIBLE SIGMOIDOSCOPY;  Surgeon: Lynann Bologna, MD;  Location: [facility];  Service: Gastroenterology;  Laterality: N/A;   NIPPLE SPARING MASTECTOMY Bilateral 11/09/2020   Procedure: BILATERAL NIPPLE SPARING MASTECTOMY;  Surgeon: Emelia Loron, MD;  Location: [facility];  Service: General;  Laterality: Bilateral;   PORT-A-CATH REMOVAL N/A 07/25/2021   Procedure: REMOVAL PORT-A-CATH;  Surgeon: Allena Napoleon, MD;  Location: [facility];  Service: Plastics;  Laterality: N/A;   PORTACATH PLACEMENT N/A 06/23/2020   Procedure: INSERTION PORT-A-CATH;  Surgeon: Emelia Loron, MD;  Location: [facility];  Service: General;  Laterality: N/A;  START TIME OF 11:00 AM FOR 60 MINUTES [place] [facility]   PORTACATH PLACEMENT Left 05/02/2022   Procedure: INSERTION PORT-A-CATH;  Surgeon: Emelia Loron, MD;  Location: [facility];  Service: General;  Laterality: Left;   RADIOACTIVE SEED GUIDED EXCISIONAL BREAST BIOPSY Right 05/02/2022   Procedure: RADIOACTIVE SEED GUIDED RIGHT BREAST MASS EXCISION;  Surgeon: Emelia Loron, MD;  Location: [facility];  Service: General;  Laterality: Right;   REMOVAL OF BILATERAL TISSUE EXPANDERS WITH PLACEMENT OF BILATERAL BREAST IMPLANTS Bilateral 07/25/2021   Procedure:  REMOVAL OF BILATERAL TISSUE EXPANDERS WITH PLACEMENT OF BILATERAL BREAST IMPLANTS;  Surgeon: Allena Napoleon, MD;  Location: [facility];  Service: Plastics;  Laterality: Bilateral;  1.5    Family History  Problem Relation Age of Onset   Diabetes Father    Breast cancer Maternal Aunt        dx unknown age   Diabetes Paternal Aunt    Breast cancer Cousin        maternal cousin; dx unknown age   Esophageal cancer Neg Hx    Rectal cancer Neg Hx    Stomach cancer Neg Hx    Colon polyps Neg Hx    Colon cancer Neg Hx    Social History:  reports that she has quit smoking. Her smoking use included cigarettes. She has never used smokeless tobacco. She reports current alcohol use of about 1.0 - 2.0 standard drink of alcohol per week. She reports that she does not use drugs.  Allergies:  Allergies  Allergen Reactions   Nsaids Other (See Comments)    Crohns disease/ IBD   Other     BLOOD PRODUCT REFUSAL    Medications Prior to Admission  Medication Sig Dispense Refill   Iron Combinations (IRON COMPLEX PO) Take by mouth.     Multiple Vitamins-Minerals (MULTIVITAMIN WITH MINERALS) tablet Take 1 tablet by mouth daily.     potassium chloride SA (KLOR-CON M) 20 MEQ tablet Take 1 tablet (20 mEq total) by mouth daily. 90 tablet 2   acetaminophen (TYLENOL) 500 MG tablet Take 500-1,000 mg by mouth every 6 (six) hours as needed for mild pain.     lidocaine-prilocaine (EMLA) cream Apply to affected area once 30 g 3   ondansetron (ZOFRAN) 8 MG tablet Take 1 tablet (8 mg total) by mouth every 8 (eight) hours as needed for nausea or vomiting. 30 tablet 1   prochlorperazine (COMPAZINE) 10 MG tablet Take 1 tablet (10 mg total) by mouth every 6 (six) hours as needed for nausea or vomiting. 30 tablet 1    Results for orders placed or performed during the hospital encounter of 08/07/22 (from the past 48 hour(s))  No blood products     Status: None   Collection Time: 08/07/22 11:40 AM  Result  Value Ref Range   Transfuse no blood products      TRANSFUSE NO BLOOD PRODUCTS, VERIFIED BY CHRISTINA WOODS RN Performed at [facility], [address] [place], [place] [address]    No results found.    Blood pressure (!) 128/92, pulse 87, temperature 98.1 F (36.7 C), temperature source Oral, resp. rate 16, height 5\' 4"  (1.626 m), weight 60 kg, last menstrual period 04/24/2021, SpO2 100 %. Physical Exam   [facility] CTAP RRR Exposed right breast implant.  No infection.   Assessment/Plan Here for removal implant with closure.  Risks and benefits discussed.  Proceed with surgery.  Allena Napoleon, MD 08/07/2022, 12:55 PM

## 2022-08-07 NOTE — Brief Op Note (Signed)
08/07/2022  1:43 PM  PATIENT:  [name]  47 y.o. female  PRE-OPERATIVE DIAGNOSIS:  BREAST IMPLANT  POST-OPERATIVE DIAGNOSIS:  BREAST IMPLANT  PROCEDURE:  Procedure(s): REMOVAL  OF RIGHT BREAST IMPLANTS (Right)  SURGEON:  Surgeon(s) and Role:    * Allena Napoleon, MD - Primary  PHYSICIAN ASSISTANT:   ASSISTANTS: none   ANESTHESIA:   general  EBL:  15 mL   BLOOD ADMINISTERED:none  DRAINS: (1) Jackson-Pratt drain(s) with closed bulb suction in the right chest    LOCAL MEDICATIONS USED:  MARCAINE     SPECIMEN:  Source of Specimen:  right breast skin  DISPOSITION OF SPECIMEN:  PATHOLOGY  COUNTS:  YES  TOURNIQUET:  * No tourniquets in log *  DICTATION: .Dragon Dictation  PLAN OF CARE: Discharge to home after PACU  PATIENT DISPOSITION:  PACU - hemodynamically stable.   Delay start of Pharmacological VTE agent (>24hrs) due to surgical blood loss or risk of bleeding: not applicable

## 2022-08-07 NOTE — Transfer of Care (Signed)
Immediate Anesthesia Transfer of Care Note  Patient: [name]  Procedure(s) Performed: REMOVAL  OF RIGHT BREAST IMPLANTS (Right: Breast)  Patient Location: PACU  Anesthesia Type:General  Level of Consciousness: awake and patient cooperative  Airway & Oxygen Therapy: Patient Spontanous Breathing and Patient connected to face mask oxygen  Post-op Assessment: Report given to RN and Post -op Vital signs reviewed and stable  Post vital signs: Reviewed and stable  Last Vitals:  Vitals Value Taken Time  BP 104/72 08/07/22 1345  Temp 36.3 C 08/07/22 1344  Pulse 84 08/07/22 1349  Resp 14 08/07/22 1349  SpO2 100 % 08/07/22 1349  Vitals shown include unvalidated device data.  Last Pain:  Vitals:   08/07/22 1148  TempSrc: Oral  PainSc: 0-No pain      Patients Stated Pain Goal: 4 (08/07/22 1148)  Complications: No notable events documented.

## 2022-08-07 NOTE — Interval H&P Note (Signed)
History and Physical Interval Note:  08/07/2022 12:57 PM  [name]  has presented today for surgery, with the diagnosis of BREAST IMPLANT.  The various methods of treatment have been discussed with the patient and family. After consideration of risks, benefits and other options for treatment, the patient has consented to  Procedure(s): REMOVAL  OF RIGHT BREAST IMPLANTS (Right) as a surgical intervention.  The patient's history has been reviewed, patient examined, no change in status, stable for surgery.  I have reviewed the patient's chart and labs.  Questions were answered to the patient's satisfaction.     Allena Napoleon

## 2022-08-07 NOTE — Op Note (Signed)
Operative Note    DATE OF OPERATION: 08/07/2022   SURGICAL DEPARTMENT: Plastic Surgery   PREOPERATIVE DIAGNOSES: Right breast implant exposure   POSTOPERATIVE DIAGNOSES:  same   PROCEDURE: 1.  Removal of intact right breast implant 2. Complex closure right breast 9cm   SURGEON: Ancil Linsey, MD   ASSISTANT: None   ANESTHESIA:  General.    COMPLICATIONS: None.    INDICATIONS FOR PROCEDURE:  Patient presents with exposure of right breast implant after delayed healing from excision of cancer recurrence.   CONSENT:  Informed consent was obtained directly from the patient. Risks, benefits and alternatives were fully discussed. Specific risks including but not limited to bleeding, infection, hematoma, seroma, scarring, pain, contracture, asymmetry, wound healing problems, and need for further surgery were all discussed. The patient did have an ample opportunity to have questions answered to satisfaction.    DESCRIPTION OF PROCEDURE:  Patient was identified preoperatively.  Informed consent was obtained.  Risks and benefits were discussed.  She was taken to the operating room and placed supine on the OR table.  She was prepped and draped in a sterile fashion.  Timeout was performed.  Marcaine with epinephrine was injected for a field block.  1cm of skin was excised from around her wound to freshen up the edges.  The implant was easily removed.  The pocket was irrigated with antibiotic solution.  A 23F drain was placed and secured with a nylon suture.  The skin was then undermined and advanced and closed in layers with an Insorb stapler and 3-0 PDS.  Compressive dressing was applied.  The patient tolerated the procedure well and was extubated uneventfully.  She was transferred to the PACU in stable condition.

## 2022-08-07 NOTE — Discharge Instructions (Addendum)
Post Anesthesia Home Care Instructions  Activity: Get plenty of rest for the remainder of the day. A responsible individual must stay with you for 24 hours following the procedure.  For the next 24 hours, DO NOT: -Drive a car -Advertising copywriter -Drink alcoholic beverages -Take any medication unless instructed by your physician -Make any legal decisions or sign important papers.  Meals: Start with liquid foods such as gelatin or soup. Progress to regular foods as tolerated. Avoid greasy, spicy, heavy foods. If nausea and/or vomiting occur, drink only clear liquids until the nausea and/or vomiting subsides. Call your physician if vomiting continues.  Special Instructions/Symptoms: Your throat may feel dry or sore from the anesthesia or the breathing tube placed in your throat during surgery. If this causes discomfort, gargle with warm salt water. The discomfort should disappear within 24 hours.  If you had a scopolamine patch placed behind your ear for the management of post- operative nausea and/or vomiting:  1. The medication in the patch is effective for 72 hours, after which it should be removed.  Wrap patch in a tissue and discard in the trash. Wash hands thoroughly with soap and water. 2. You may remove the patch earlier than 72 hours if you experience unpleasant side effects which may include dry mouth, dizziness or visual disturbances. 3. Avoid touching the patch. Wash your hands with soap and water after contact with the patch.       About my Jackson-Pratt Bulb Drain  What is a Jackson-Pratt bulb? A Jackson-Pratt is a soft, round device used to collect drainage. It is connected to a long, thin drainage catheter, which is held in place by one or two small stiches near your surgical incision site. When the bulb is squeezed, it forms a vacuum, forcing the drainage to empty into the bulb.  Emptying the Jackson-Pratt bulb- To empty the bulb: 1. Release the plug on the top of the  bulb. 2. Pour the bulb's contents into a measuring container which your nurse will provide. 3. Record the time emptied and amount of drainage. Empty the drain(s) as often as your     doctor or nurse recommends.  Date                  Time                    Amount (Drain 1)                 Amount (Drain 2)  _____________________________________________________________________  _____________________________________________________________________  _____________________________________________________________________  _____________________________________________________________________  _____________________________________________________________________  _____________________________________________________________________  _____________________________________________________________________  _____________________________________________________________________  Squeezing the Jackson-Pratt Bulb- To squeeze the bulb: 1. Make sure the plug at the top of the bulb is open. 2. Squeeze the bulb tightly in your fist. You will hear air squeezing from the bulb. 3. Replace the plug while the bulb is squeezed. 4. Use a safety pin to attach the bulb to your clothing. This will keep the catheter from     pulling at the bulb insertion site.  When to call your doctor- Call your doctor if: Drain site becomes red, swollen or hot. You have a fever greater than 101 degrees F. There is oozing at the drain site. Drain falls out (apply a guaze bandage over the drain hole and secure it with tape). Drainage increases daily not related to activity patterns. (You will usually have more drainage when you are active than when you are resting.) Drainage has a bad odor.  Patient stated she has a post operative appointment scheduled for next week            Empty and record JP output.  You can shower tomorrow.  Avoid strenuous activity.  Keep the pink wrap applied most of the time for gentle  pressure.

## 2022-08-08 ENCOUNTER — Encounter (HOSPITAL_BASED_OUTPATIENT_CLINIC_OR_DEPARTMENT_OTHER): Payer: Self-pay | Admitting: Plastic Surgery

## 2022-08-08 NOTE — Anesthesia Postprocedure Evaluation (Signed)
Anesthesia Post Note  Patient: [name]  Procedure(s) Performed: REMOVAL  OF RIGHT BREAST IMPLANTS (Right: Breast)     Patient location during evaluation: PACU Anesthesia Type: General Level of consciousness: awake and alert Pain management: pain level controlled Vital Signs Assessment: post-procedure vital signs reviewed and stable Respiratory status: spontaneous breathing, nonlabored ventilation, respiratory function stable and patient connected to nasal cannula oxygen Cardiovascular status: blood pressure returned to baseline and stable Postop Assessment: no apparent nausea or vomiting Anesthetic complications: no   No notable events documented.  Last Vitals:  Vitals:   08/07/22 1423 08/07/22 1446  BP: 125/79 132/85  Pulse: 90 87  Resp: (!) 22 20  Temp:  (!) 36.3 C  SpO2: 100% 99%    Last Pain:  Vitals:   08/07/22 1446  TempSrc: Temporal  PainSc: 3                  Armas Mcbee S

## 2022-08-09 LAB — SURGICAL PATHOLOGY

## 2022-08-10 ENCOUNTER — Other Ambulatory Visit: Payer: Managed Care, Other (non HMO)

## 2022-08-10 ENCOUNTER — Ambulatory Visit: Payer: Managed Care, Other (non HMO) | Admitting: Hematology and Oncology

## 2022-08-10 ENCOUNTER — Ambulatory Visit: Payer: Managed Care, Other (non HMO)

## 2022-08-11 ENCOUNTER — Ambulatory Visit: Payer: Managed Care, Other (non HMO) | Admitting: Hematology and Oncology

## 2022-08-11 ENCOUNTER — Ambulatory Visit: Payer: Managed Care, Other (non HMO) | Admitting: Adult Health

## 2022-08-11 ENCOUNTER — Other Ambulatory Visit: Payer: Managed Care, Other (non HMO)

## 2022-08-11 ENCOUNTER — Ambulatory Visit: Payer: Managed Care, Other (non HMO)

## 2022-08-15 ENCOUNTER — Encounter: Payer: Self-pay | Admitting: Adult Health

## 2022-08-15 ENCOUNTER — Telehealth: Payer: Self-pay | Admitting: Hematology and Oncology

## 2022-08-15 NOTE — Telephone Encounter (Signed)
Scheduled appointments per WQ. Left voicemail. 

## 2022-08-30 ENCOUNTER — Encounter: Payer: Self-pay | Admitting: Internal Medicine

## 2022-08-30 ENCOUNTER — Ambulatory Visit (INDEPENDENT_AMBULATORY_CARE_PROVIDER_SITE_OTHER): Payer: Managed Care, Other (non HMO) | Admitting: Internal Medicine

## 2022-08-30 VITALS — BP 100/68 | HR 111 | Ht 64.0 in | Wt 132.0 lb

## 2022-08-30 DIAGNOSIS — K50112 Crohn's disease of large intestine with intestinal obstruction: Secondary | ICD-10-CM | POA: Diagnosis not present

## 2022-08-30 DIAGNOSIS — R7989 Other specified abnormal findings of blood chemistry: Secondary | ICD-10-CM

## 2022-08-30 NOTE — Patient Instructions (Signed)
_______________________________________________________  If your blood pressure at your visit was 140/90 or greater, please contact your primary care physician to follow up on this.  _______________________________________________________  If you are age 45 or older, your body mass index should be between 23-30. Your Body mass index is 22.66 kg/m. If this is out of the aforementioned range listed, please consider follow up with your Primary Care Provider.  If you are age 1 or younger, your body mass index should be between 19-25. Your Body mass index is 22.66 kg/m. If this is out of the aformentioned range listed, please consider follow up with your Primary Care Provider.   ________________________________________________________  The [facility] providers would like to encourage you to use [facility] to communicate with providers for non-urgent requests or questions.  Due to long hold times on the telephone, sending your provider a message by [facility] may be a faster and more efficient way to get a response.  Please allow 48 business hours for a response.  Please remember that this is for non-urgent requests.  _______________________________________________________  I appreciate the opportunity to care for you. Stan Head, MD, [facility]

## 2022-08-30 NOTE — Progress Notes (Signed)
[name] 47 y.o. 21-Nov-1975 [id number]  Assessment & Plan:   Encounter Diagnoses  Name Primary?   Crohn's colitis ? small bowel, with intestinal obstruction (HCC) Yes   Abnormal LFTs    Clinically she seems reasonably well off therapy at this time.  She is at significant risk of recurrent problems however.  We will monitor, plan follow-up in about 3 months sooner if needed.  Should she flare will discuss options for therapy with her oncologist Dr. Pamelia Hoit.  She did not really respond well to prednisone in the past though I suspect that that would be the safest option depending upon what is happening with her breast cancer therapy.  Regarding her LFT changes she has follow-up with Dr. Pamelia Hoit on that.  Defer to him with respect to possible toxicity.  I did explain that those enzymes may come from other body tissues as well at times.  Subjective:  Gastroenterology summary:  Crohn's colitis and small bowel disease diagnosed 2008, initially treated with mesalamine for a number of years.  2021 had sigmoid stricture, not able to pass with scope, and questionable mass in the right colon.  Subtotal colectomy by Dr. Maisie Fus March 2021  Multiple hospitalizations with obstructions after that, eventually treated with Entyvio late 2023 therapy discontinued early 2024 due to recurrent breast cancer and need for chemotherapy and immunosuppression contraindicated 09/07/2021 flex sig (most recent endoscopic exam) - Anastomotic stenosis with ulcers and edema s/o recurrence of Crohn's disease. Biopsied. - Crohn's proctitis- biopsied  A. COLON, ILEO-SIGMOID ANASTOMOSIS, BIOPSY:  - Enteric mucosa with nonspecific architectural and inflammatory  changes, consistent with anastomotic site  - Negative for dysplasia or granulomas   B. RECTUM, BIOPSY:  - Moderately active chronic colitis, consistent with patient's clinical  history of Crohn's disease  - Negative for dysplasia  COMMENT:  B. 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    Significant anxiety about health and possible medication side effects     Chief Complaint: Follow-up of Crohn's disease  HPI 47 year old African-American woman with recurrent breast cancer and Crohn's disease as outlined above who presents for follow-up, having been off Entyvio therapy for couple of months since starting chemotherapy  (Kadcyla)for breast cancer.  She feels like she is doing okay off therapy, sometimes will have difficulty with foods like tomatoes or corn so she is eliminated those, that would cause diarrhea.  She did have some transient diarrhea with her chemotherapy she has anemia related and is being treated with iron and potassium is being supplemented.  She has not been constipated.  There is no significant abdominal pain.  She recently had lab testing as part of her preop for removal of her breast implant and was concerned because her AST was 55 ALT 37 and alk phos 144.  Previous LFTs have been normal. Allergies  Allergen Reactions   Nsaids Other (See Comments)    Crohns disease/ IBD   Other     BLOOD PRODUCT REFUSAL   Current Meds  Medication Sig   acetaminophen (TYLENOL) 500 MG tablet Take 500-1,000 mg by mouth every 6 (six) hours as needed for mild pain.   Iron Combinations (IRON COMPLEX PO) Take by mouth.   lidocaine-prilocaine (EMLA) cream Apply to affected area once   Multiple Vitamins-Minerals (MULTIVITAMIN WITH MINERALS) tablet Take 1 tablet by mouth daily.   potassium chloride SA (KLOR-CON M) 20 MEQ tablet Take 1 tablet (20 mEq total) by  mouth daily.   Past Medical History:  Diagnosis Date   Cancer [facility])    Colon stricture (HCC) 05/07/2019   Crohn's colitis, with intestinal obstruction (HCC) 09/17/2013   Dx 2006 - right and left colon involved 2011 - pan-colitis 09/17/2013 left colitis 60-20 cm March 2021 subtotal colectomy ileosigmoid anastomosis for  colonic strictures-Dr. Maisie Fus     Crohn's disease [facility])    Family history of breast cancer 06/11/2020   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 10/17/2010   Rectal bleeding    Uterine fibroid    Vitamin D deficiency 09/24/2013   Past Surgical History:  Procedure Laterality Date   AXILLARY LYMPH NODE DISSECTION Right 11/09/2020   Procedure: RIGHT AXILLARY LYMPH NODE DISSECTION;  Surgeon: Emelia Loron, MD;  Location: [facility];  Service: General;  Laterality: Right;   BIOPSY  07/01/2020   Procedure: BIOPSY;  Surgeon: Benancio Deeds, MD;  [facility];  Service: Gastroenterology;;   BIOPSY  01/27/2021   Procedure: BIOPSY;  Surgeon: Benancio Deeds, MD;  [facility];  Service: Gastroenterology;;   BIOPSY  09/07/2021   Procedure: BIOPSY;  Surgeon: Lynann Bologna, MD;  [facility];  Service: Gastroenterology;;   BREAST BIOPSY Right 03/24/2022   [place] RT BREAST BX W LOC DEV 1ST LESION IMG BX SPEC US GUIDE 03/24/2022 [facility]   BREAST BIOPSY  04/28/2022   [place] RT RADIOACTIVE SEED LOC 04/28/2022 [facility]   BREAST IMPLANT REMOVAL Right 08/07/2022   Procedure: REMOVAL  OF RIGHT BREAST IMPLANTS;  Surgeon: Allena Napoleon, MD;  Location: [facility];  Service: Plastics;  Laterality: Right;   BREAST RECONSTRUCTION WITH PLACEMENT OF TISSUE EXPANDER AND FLEX HD (ACELLULAR HYDRATED DERMIS) Bilateral 11/09/2020   Procedure: BILATERAL BREAST RECONSTRUCTION WITH PLACEMENT OF TISSUE EXPANDER AND FLEX HD (ACELLULAR HYDRATED DERMIS);  Surgeon: Allena Napoleon, MD;  Location: [facility];  Service: Plastics;  Laterality: Bilateral;   CAPSULOTOMY Bilateral 07/25/2021   Procedure: CAPSULOTOMY;  Surgeon: Allena Napoleon, MD;  Location: [facility];  Service: Plastics;  Laterality: Bilateral;   COLECTOMY  06/27/2019   COLONOSCOPY  2015   FLEXIBLE SIGMOIDOSCOPY N/A  07/01/2020   Procedure: FLEXIBLE SIGMOIDOSCOPY;  Surgeon: Benancio Deeds, MD;  [facility];  Service: Gastroenterology;  Laterality: N/A;   FLEXIBLE SIGMOIDOSCOPY N/A 01/27/2021   Procedure: FLEXIBLE SIGMOIDOSCOPY;  Surgeon: Benancio Deeds, MD;  [facility];  Service: Gastroenterology;  Laterality: N/A;   FLEXIBLE SIGMOIDOSCOPY N/A 09/07/2021   Procedure: FLEXIBLE SIGMOIDOSCOPY;  Surgeon: Lynann Bologna, MD;  [facility];  Service: Gastroenterology;  Laterality: N/A;   NIPPLE SPARING MASTECTOMY Bilateral 11/09/2020   Procedure: BILATERAL NIPPLE SPARING MASTECTOMY;  Surgeon: Emelia Loron, MD;  Location: [facility];  Service: General;  Laterality: Bilateral;   PORT-A-CATH REMOVAL N/A 07/25/2021   Procedure: REMOVAL PORT-A-CATH;  Surgeon: Allena Napoleon, MD;  Location: [facility];  Service: Plastics;  Laterality: N/A;   PORTACATH PLACEMENT N/A 06/23/2020   Procedure: INSERTION PORT-A-CATH;  Surgeon: Emelia Loron, MD;  Location: [facility];  Service: General;  Laterality: N/A;  START TIME OF 11:00 AM FOR 60 MINUTES [place]   PORTACATH PLACEMENT Left 05/02/2022   Procedure: INSERTION PORT-A-CATH;  Surgeon: Emelia Loron, MD;  Location: [facility];  Service: General;  Laterality: Left;   RADIOACTIVE SEED GUIDED EXCISIONAL BREAST BIOPSY Right 05/02/2022   Procedure: RADIOACTIVE SEED GUIDED  RIGHT BREAST MASS EXCISION;  Surgeon: Emelia Loron, MD;  Location: [facility];  Service: General;  Laterality: Right;   REMOVAL OF BILATERAL TISSUE EXPANDERS WITH PLACEMENT OF BILATERAL BREAST IMPLANTS Bilateral 07/25/2021   Procedure: REMOVAL OF BILATERAL TISSUE EXPANDERS WITH PLACEMENT OF BILATERAL BREAST IMPLANTS;  Surgeon: Allena Napoleon, MD;  Location: [facility];  Service: Plastics;  Laterality: Bilateral;  1.5   Social History   Social History Narrative    Single, 1 daughter born 2001 approximately   Energy manager for [employer]   She is a former smoker, occasional alcohol no drug use   family history includes Breast cancer in her cousin and maternal aunt; Diabetes in her father and paternal aunt.   Review of Systems As above  Objective:   Physical Exam BP 100/68   Pulse (!) 111   Ht 5\' 4"  (1.626 m)   Wt 132 lb (59.9 kg)   LMP 04/24/2021   BMI 22.66 kg/m  Middle-aged African-American woman no acute distress Abdomen is somewhat protuberant soft and nontender, bowel sounds are increased.

## 2022-08-31 ENCOUNTER — Encounter: Payer: Self-pay | Admitting: Hematology and Oncology

## 2022-08-31 ENCOUNTER — Encounter: Payer: Self-pay | Admitting: Internal Medicine

## 2022-09-01 ENCOUNTER — Inpatient Hospital Stay: Payer: Managed Care, Other (non HMO)

## 2022-09-01 ENCOUNTER — Ambulatory Visit: Payer: Managed Care, Other (non HMO)

## 2022-09-01 ENCOUNTER — Other Ambulatory Visit: Payer: Self-pay

## 2022-09-01 ENCOUNTER — Encounter: Payer: Self-pay | Admitting: Adult Health

## 2022-09-01 ENCOUNTER — Inpatient Hospital Stay: Payer: Managed Care, Other (non HMO) | Attending: Hematology and Oncology

## 2022-09-01 ENCOUNTER — Encounter: Payer: Self-pay | Admitting: *Deleted

## 2022-09-01 ENCOUNTER — Inpatient Hospital Stay (HOSPITAL_BASED_OUTPATIENT_CLINIC_OR_DEPARTMENT_OTHER): Payer: Managed Care, Other (non HMO) | Admitting: Adult Health

## 2022-09-01 ENCOUNTER — Other Ambulatory Visit: Payer: Managed Care, Other (non HMO)

## 2022-09-01 ENCOUNTER — Ambulatory Visit: Payer: Managed Care, Other (non HMO) | Admitting: Hematology and Oncology

## 2022-09-01 DIAGNOSIS — C50411 Malignant neoplasm of upper-outer quadrant of right female breast: Secondary | ICD-10-CM | POA: Diagnosis present

## 2022-09-01 DIAGNOSIS — Z452 Encounter for adjustment and management of vascular access device: Secondary | ICD-10-CM | POA: Diagnosis not present

## 2022-09-01 DIAGNOSIS — Z170 Estrogen receptor positive status [ER+]: Secondary | ICD-10-CM

## 2022-09-01 DIAGNOSIS — Z95828 Presence of other vascular implants and grafts: Secondary | ICD-10-CM

## 2022-09-01 LAB — CMP (CANCER CENTER ONLY)
ALT: 14 U/L (ref 0–44)
AST: 20 U/L (ref 15–41)
Albumin: 4 g/dL (ref 3.5–5.0)
Alkaline Phosphatase: 140 U/L — ABNORMAL HIGH (ref 38–126)
Anion gap: 6 (ref 5–15)
BUN: 14 mg/dL (ref 6–20)
CO2: 27 mmol/L (ref 22–32)
Calcium: 9.1 mg/dL (ref 8.9–10.3)
Chloride: 105 mmol/L (ref 98–111)
Creatinine: 1.05 mg/dL — ABNORMAL HIGH (ref 0.44–1.00)
GFR, Estimated: >60 mL/min (ref >60)
Glucose, Bld: 98 mg/dL (ref 70–99)
Potassium: 3.8 mmol/L (ref 3.5–5.1)
Sodium: 138 mmol/L (ref 135–145)
Total Bilirubin: 0.7 mg/dL (ref 0.3–1.2)
Total Protein: 7.5 g/dL (ref 6.5–8.1)

## 2022-09-01 LAB — CBC WITH DIFFERENTIAL (CANCER CENTER ONLY)
Abs Immature Granulocytes: 0.01 10*3/uL (ref 0.00–0.07)
Basophils Absolute: 0 10*3/uL (ref 0.0–0.1)
Basophils Relative: 1 %
Eosinophils Absolute: 0.1 10*3/uL (ref 0.0–0.5)
Eosinophils Relative: 1 %
HCT: 33.3 % — ABNORMAL LOW (ref 36.0–46.0)
Hemoglobin: 10.9 g/dL — ABNORMAL LOW (ref 12.0–15.0)
Immature Granulocytes: 0 %
Lymphocytes Relative: 23 %
Lymphs Abs: 1.9 10*3/uL (ref 0.7–4.0)
MCH: 26.5 pg (ref 26.0–34.0)
MCHC: 32.7 g/dL (ref 30.0–36.0)
MCV: 81 fL (ref 80.0–100.0)
Monocytes Absolute: 0.7 10*3/uL (ref 0.1–1.0)
Monocytes Relative: 9 %
Neutro Abs: 5.5 10*3/uL (ref 1.7–7.7)
Neutrophils Relative %: 66 %
Platelet Count: 310 10*3/uL (ref 150–400)
RBC: 4.11 MIL/uL (ref 3.87–5.11)
RDW: 15.9 % — ABNORMAL HIGH (ref 11.5–15.5)
WBC Count: 8.2 10*3/uL (ref 4.0–10.5)
nRBC: 0 % (ref 0.0–0.2)

## 2022-09-01 MED ORDER — SODIUM CHLORIDE 0.9% FLUSH
10.0000 mL | Freq: Once | INTRAVENOUS | Status: AC
  Administered 2022-09-01: 10 mL

## 2022-09-01 NOTE — Patient Instructions (Signed)

## 2022-09-01 NOTE — Progress Notes (Signed)
[facility] Cancer Follow up:    Patient, [name] No address on file   DIAGNOSIS:  Cancer Staging  Malignant neoplasm of upper-outer quadrant of right breast in female, estrogen receptor positive (HCC) Staging form: Breast, AJCC 8th Edition - Clinical: Stage IIIB (cT4d, cN1, cM0, G3, ER+, PR-, HER2+) - Signed by Serena Croissant, MD on 07/07/2020 Histologic grading system: 3 grade system   SUMMARY OF ONCOLOGIC HISTORY: Oncology History  Malignant neoplasm of upper-outer quadrant of right breast in female, estrogen receptor positive (HCC)  06/03/2020 Mammogram   06/03/2020, US guided biopsy of the right breast 9 0 clock mass showed grade III IDC; Prognostics showed ER 40% positive, weak staining, PR positive 0 %, Her 2 3 +, Ki 67 30%   06/04/2020 Initial Diagnosis   [id number] 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 palpable right breast mass measuring about 7 cm x 5 and half centimeters, palpable right axillary lymph node with concern for skin invasion    06/08/2020 Cancer Staging   Staging form: Breast, AJCC 8th Edition - Clinical: Stage IIIB (cT4d, cN1, cM0, G3, ER+, PR-, HER2+) - Signed by Serena Croissant, MD on 07/07/2020 Histologic grading system: 3 grade system   06/24/2020 Genetic Testing   No pathogenic variants detected in [facility] [employer] +RNAinsight.  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  The report date is June 24, 2020.   The CustomNext-Cancer+RNAinsight panel offered by [facility] includes sequencing and rearrangement analysis for the following 47 genes:  APC, ATM, AXIN2, BARD1, BMPR1A, BRCA1, BRCA2, BRIP1, CDH1, CDK4, CDKN2A, CHEK2, DICER1, EPCAM, GREM1, HOXB13, MEN1, MLH1, MSH2, MSH3, MSH6, MUTYH, NBN, NF1, NF2, NTHL1, PALB2, PMS2, POLD1, POLE, PTEN, RAD51C, RAD51D, RECQL, RET, SDHA, SDHAF2, SDHB, SDHC, SDHD, SMAD4, SMARCA4, STK11, TP53, TSC1, TSC2, and VHL.  RNA data is routinely analyzed for use in variant  interpretation for all genes.   06/25/2020 - 10/29/2020 Chemotherapy   TCHP x6 cycles   11/09/2020 Surgery   Bilateral mastectomies: Left mastectomy: Benign Right mastectomy: Residual microinvasive cancer status post neoadjuvant therapy, DCIS, 0/12 lymph nodes, ER +1%, PR negative, HER2 equivocal 2+ by [facility]   11/19/2020 - 07/15/2021 Chemotherapy   Patient is on Treatment Plan : BREAST Trastuzumab  + Pertuzumab q21d x 13 cycles     01/11/2021 - 03/09/2021 Radiation Therapy   Site Technique Total Dose (Gy) Dose per Fx (Gy) Completed Fx Beam Energies  Chest Wall, Right: CW_Rt 3D 50.4/50.4 1.8 28/28 10X  Chest Wall, Right: CW_Rt_SCLV 3D 50.4/50.4 1.8 28/28 6X, 10X  Chest Wall, Right: CW_Rt_Bst Electron 10/10 2 5/5 6E     06/08/2021 - 06/24/2021 Anti-estrogen oral therapy   Tamoxifen (discontinued)   03/24/2022 Relapse/Recurrence   Right chest palpable nodule: 1.2 cm mass at 10 o'clock position biopsy revealed grade 2 IDC with high-grade DCIS ER 20% positive weak staining, PR 0%, HER2 3+ positive, Ki-67 50%   04/06/2022 Imaging   CT chest/abd/pelvis  IMPRESSION: 1. No evidence of metastatic disease in the chest, abdomen or pelvis. 2. Similar probable postradiation change in the peripheral anterior right upper lobe, anterior right middle lobe, lateral right lower lobe and right lung apex. 3. Surgical changes of subtotal colectomy with short segment wall thickening of the neo terminal ileum and short segments of small bowel throughout the abdomen pelvis, with interposed gas and fluid-filled distended loops of small bowel intermixed between the areas of inflammation. Findings are compatible active inflammatory Crohn's enteritis. 4. Small volume right lower  quadrant and pelvic free fluid, likely reactive.     Electronically Signed   By: Maudry Mayhew M.D.   On: 04/06/2022 12:53     05/02/2022 Surgery   Right lumpectomy: Recurrent grade 3 IDC, posterior margin focally involved by  cancer.   05/17/2022 -  Chemotherapy   Patient is on Treatment Plan : BREAST ADO-Trastuzumab Emtansine (Kadcyla) q21d       CURRENT THERAPY: Kadcyla  INTERVAL HISTORY: AZARIA KASAI 47 y.o. female returns for f/u prior to receiving Kadcyla.  She has been doing moderately well.  She missed her last treatment due to implant removal.  Her most recent echo occurred on 05/24/2022 and demonstrated a LVEF of 60-65%.  She is concerned about her previously elevated liver enzymes that were drawn by Dr. Arita Miss.  She is concerned about the elevation and its relation to the Kadcyla she is receiving.    Patient Active Problem List   Diagnosis Date Noted   Crohn's disease of small and large intestines with complication (HCC)    SBO (small bowel obstruction) (HCC) 01/26/2021   History of Crohn's disease    Breast cancer (HCC) 11/09/2020   Port-A-Cath in place 07/15/2020   Genetic testing 06/25/2020   Family history of breast cancer 06/11/2020   Malignant neoplasm of upper-outer quadrant of right breast in female, estrogen receptor positive (HCC) 06/04/2020   Abnormal finding present on diagnostic imaging of uterus 05/07/2019   Vitamin D deficiency 09/24/2013   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 10/17/2010    is allergic to nsaids and other.  MEDICAL HISTORY: Past Medical History:  Diagnosis Date   Cancer [facility])    Colon stricture (HCC) 05/07/2019   Crohn's colitis, with intestinal obstruction (HCC) 09/17/2013   Dx 2006 - right and left colon involved 2011 - pan-colitis 09/17/2013 left colitis 60-20 cm March 2021 subtotal colectomy ileosigmoid anastomosis for colonic strictures-Dr. Maisie Fus     Crohn's disease [facility])    Family history of breast cancer 06/11/2020   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 10/17/2010   Rectal bleeding    Uterine fibroid    Vitamin D deficiency 09/24/2013    SURGICAL HISTORY: Past Surgical  History:  Procedure Laterality Date   AXILLARY LYMPH NODE DISSECTION Right 11/09/2020   Procedure: RIGHT AXILLARY LYMPH NODE DISSECTION;  Surgeon: Emelia Loron, MD;  Location: [facility];  Service: General;  Laterality: Right;   BIOPSY  07/01/2020   Procedure: BIOPSY;  Surgeon: Benancio Deeds, MD;  [facility];  Service: Gastroenterology;;   BIOPSY  01/27/2021   Procedure: BIOPSY;  Surgeon: Benancio Deeds, MD;  [facility];  Service: Gastroenterology;;   BIOPSY  09/07/2021   Procedure: BIOPSY;  Surgeon: Lynann Bologna, MD;  [facility];  Service: Gastroenterology;;   BREAST BIOPSY Right 03/24/2022   [place] RT BREAST BX W LOC DEV 1ST LESION IMG BX SPEC US GUIDE 03/24/2022 [facility]   BREAST BIOPSY  04/28/2022   [place] RT RADIOACTIVE SEED LOC 04/28/2022 [facility]   BREAST IMPLANT REMOVAL Right 08/07/2022   Procedure: REMOVAL  OF RIGHT BREAST IMPLANTS;  Surgeon: Allena Napoleon, MD;  Location: [facility];  Service: Plastics;  Laterality: Right;   BREAST RECONSTRUCTION WITH PLACEMENT OF TISSUE EXPANDER AND FLEX HD (ACELLULAR HYDRATED DERMIS) Bilateral 11/09/2020   Procedure: BILATERAL BREAST RECONSTRUCTION WITH PLACEMENT OF TISSUE EXPANDER AND FLEX HD (ACELLULAR HYDRATED DERMIS);  Surgeon:  Allena Napoleon, MD;  Location: [facility];  Service: Plastics;  Laterality: Bilateral;   CAPSULOTOMY Bilateral 07/25/2021   Procedure: CAPSULOTOMY;  Surgeon: Allena Napoleon, MD;  Location: [facility];  Service: Plastics;  Laterality: Bilateral;   COLECTOMY  06/27/2019   COLONOSCOPY  2015   FLEXIBLE SIGMOIDOSCOPY N/A 07/01/2020   Procedure: FLEXIBLE SIGMOIDOSCOPY;  Surgeon: Benancio Deeds, MD;  [facility];  Service: Gastroenterology;  Laterality: N/A;   FLEXIBLE SIGMOIDOSCOPY N/A 01/27/2021   Procedure: FLEXIBLE SIGMOIDOSCOPY;  Surgeon: Benancio Deeds, MD;  [facility];  Service: Gastroenterology;  Laterality: N/A;   FLEXIBLE SIGMOIDOSCOPY N/A 09/07/2021   Procedure: FLEXIBLE SIGMOIDOSCOPY;  Surgeon: Lynann Bologna, MD;  [facility];  Service: Gastroenterology;  Laterality: N/A;   NIPPLE SPARING MASTECTOMY Bilateral 11/09/2020   Procedure: BILATERAL NIPPLE SPARING MASTECTOMY;  Surgeon: Emelia Loron, MD;  Location: [facility];  Service: General;  Laterality: Bilateral;   PORT-A-CATH REMOVAL N/A 07/25/2021   Procedure: REMOVAL PORT-A-CATH;  Surgeon: Allena Napoleon, MD;  Location: [facility];  Service: Plastics;  Laterality: N/A;   PORTACATH PLACEMENT N/A 06/23/2020   Procedure: INSERTION PORT-A-CATH;  Surgeon: Emelia Loron, MD;  Location: [facility];  Service: General;  Laterality: N/A;  START TIME OF 11:00 AM FOR 60 MINUTES [place] IQ   PORTACATH PLACEMENT Left 05/02/2022   Procedure: INSERTION PORT-A-CATH;  Surgeon: Emelia Loron, MD;  Location: [facility];  Service: General;  Laterality: Left;   RADIOACTIVE SEED GUIDED EXCISIONAL BREAST BIOPSY Right 05/02/2022   Procedure: RADIOACTIVE SEED GUIDED RIGHT BREAST MASS EXCISION;  Surgeon: Emelia Loron, MD;  Location: [facility];  Service: General;  Laterality: Right;   REMOVAL OF BILATERAL TISSUE EXPANDERS WITH PLACEMENT OF BILATERAL BREAST IMPLANTS Bilateral 07/25/2021   Procedure: REMOVAL OF BILATERAL TISSUE EXPANDERS WITH PLACEMENT OF BILATERAL BREAST IMPLANTS;  Surgeon: Allena Napoleon, MD;  [facility];  Service: Plastics;  Laterality: Bilateral;  1.5    SOCIAL HISTORY: Social History   Socioeconomic History   Marital status: Significant Other    Spouse name: Not on file   Number of children: 1   Years of education: 13   Highest education level: Not on file  Occupational History   Occupation: Medical Coder   Tobacco Use   Smoking status: Former    Types: Cigarettes    Smokeless tobacco: Never  Vaping Use   Vaping Use: Never used  Substance and Sexual Activity   Alcohol use: Yes    Alcohol/week: 1.0 - 2.0 standard drink of alcohol    Types: 1 - 2 Glasses of wine per week   Drug use: No   Sexual activity: Yes    Partners: Male    Comment: Intermittent protection not consistent  Other Topics Concern   Not on file  Social History Narrative   Single, 1 daughter born 2001 approximately   Medical coder for [employer]   She is a former smoker, occasional alcohol no drug use   Social Determinants of Corporate investment banker Strain: Not on file  Food Insecurity: Not on file  Transportation Needs: Not on file  Physical Activity: Not on file  Stress: Not on file  Social Connections: Not on file  Intimate Partner Violence: Not At Risk (11/25/2020)   Humiliation, Afraid, Rape, and Kick questionnaire    Fear of Current or Ex-Partner: No    Emotionally Abused: No  Physically Abused: No    Sexually Abused: No    FAMILY HISTORY: Family History  Problem Relation Age of Onset   Diabetes Father    Breast cancer Maternal Aunt        dx unknown age   Diabetes Paternal Aunt    Breast cancer Cousin        maternal cousin; dx unknown age   Esophageal cancer Neg Hx    Rectal cancer Neg Hx    Stomach cancer Neg Hx    Colon polyps Neg Hx    Colon cancer Neg Hx     Review of Systems  Constitutional:  Negative for appetite change, chills, fatigue, fever and unexpected weight change.  HENT:   Negative for hearing loss, lump/mass and trouble swallowing.   Eyes:  Negative for eye problems and icterus.  Respiratory:  Negative for chest tightness, cough and shortness of breath.   Cardiovascular:  Negative for chest pain, leg swelling and palpitations.  Gastrointestinal:  Negative for abdominal distention, abdominal pain, constipation, diarrhea, nausea and vomiting.  Endocrine: Negative for hot flashes.  Genitourinary:  Negative for difficulty urinating.    Musculoskeletal:  Negative for arthralgias.  Skin:  Negative for itching and rash.  Neurological:  Negative for dizziness, extremity weakness, headaches and numbness.  Hematological:  Negative for adenopathy. Does not bruise/bleed easily.  Psychiatric/Behavioral:  Negative for depression. The patient is not nervous/anxious.       PHYSICAL EXAMINATION   Onc Performance Status - 09/01/22 1339       KPS SCALE   KPS % SCORE Normal, no compliants, no evidence of disease             Vitals:   09/01/22 1334  BP: 118/77  Pulse: (!) 107  Resp: 17  Temp: (!) 97.5 F (36.4 C)  SpO2: 100%    Exam deferred in lieu of counseling, patient appears well and is in no apparent distress  LABORATORY DATA:  CBC    Component Value Date/Time   WBC 8.2 09/01/2022 1301   WBC 10.3 10/20/2021 1333   RBC 4.11 09/01/2022 1301   HGB 10.9 (L) 09/01/2022 1301   HGB 10.4 (L) 01/21/2020 1056   HCT 33.3 (L) 09/01/2022 1301   HCT 33.9 (L) 01/21/2020 1056   PLT 310 09/01/2022 1301   PLT 346 01/21/2020 1056   MCV 81.0 09/01/2022 1301   MCV 71 (L) 01/21/2020 1056   MCH 26.5 09/01/2022 1301   MCHC 32.7 09/01/2022 1301   RDW 15.9 (H) 09/01/2022 1301   RDW 18.8 (H) 01/21/2020 1056   LYMPHSABS 1.9 09/01/2022 1301   LYMPHSABS 1.3 01/21/2020 1056   MONOABS 0.7 09/01/2022 1301   EOSABS 0.1 09/01/2022 1301   EOSABS 0.0 01/21/2020 1056   BASOSABS 0.0 09/01/2022 1301   BASOSABS 0.0 01/21/2020 1056    CMP     Component Value Date/Time   NA 138 09/01/2022 1301   NA 135 01/21/2020 1056   K 3.8 09/01/2022 1301   CL 105 09/01/2022 1301   CO2 27 09/01/2022 1301   GLUCOSE 98 09/01/2022 1301   BUN 14 09/01/2022 1301   BUN 12 01/21/2020 1056   CREATININE 1.05 (H) 09/01/2022 1301   CALCIUM 9.1 09/01/2022 1301   PROT 7.5 09/01/2022 1301   ALBUMIN 4.0 09/01/2022 1301   AST 20 09/01/2022 1301   ALT 14 09/01/2022 1301   ALKPHOS 140 (H) 09/01/2022 1301   BILITOT 0.7 09/01/2022 1301   GFRNONAA >60  09/01/2022 1301  GFRAA 108 01/21/2020 1056        ASSESSMENT and THERAPY PLAN:   Malignant neoplasm of upper-outer quadrant of right breast in female, estrogen receptor positive (HCC) Emmalise is a 47 year old woman with recurrent HER2 positive breast cancer status postlumpectomy here today for follow-up and evaluation prior to receiving her Kadcyla.  Stage IIIb ER positive HER2 positive breast cancer: She declined to receive Kadcyla today.  I reviewed with her that it would be perfectly safe to proceed with treatment given her AST and ALT are completely normal and the alk phos is only very slightly elevated at 140 which it has been before and is not as much of an indicator of the liver as the AST and ALT; however, she still declined.  I offered to recheck her liver enzymes next week and get her in for treatment next week or the week after so she does not miss this treatment altogether since she did not receive the last 1, however she prefers to skip this treatment and return in 3 weeks as scheduled.    [name] will return in 3 weeks for labs follow-up, and her next treatment.   All questions were answered. The patient knows to call the clinic with any problems, questions or concerns. We can certainly see the patient much sooner if necessary.  Total encounter time:30 minutes*in face-to-face visit time, chart review, lab review, care coordination, order entry, and documentation of the encounter time.    Lillard Anes, NP 09/01/22 2:36 PM Medical Oncology and Hematology [facility] [address] [place], [place] [address] Tel. [phone]    Fax. [phone]  *Total Encounter Time as defined by the Centers for Medicare and Medicaid Services includes, in addition to the face-to-face time of a patient visit (documented in the note above) non-face-to-face time: obtaining and reviewing outside history, ordering and reviewing medications, tests or procedures, care  coordination (communications with other health care professionals or caregivers) and documentation in the medical record.

## 2022-09-01 NOTE — Progress Notes (Signed)
This nurse flushed port with normal saline, locked wit Heparin.  Port was de-accessed.  Patient tolerated well.  Stable at discharge.

## 2022-09-01 NOTE — Assessment & Plan Note (Addendum)
[name] is a 47 year old woman with recurrent HER2 positive breast cancer status postlumpectomy here today for follow-up and evaluation prior to receiving her Kadcyla.  Stage IIIb ER positive HER2 positive breast cancer: She declined to receive Kadcyla today.  I reviewed with her that it would be perfectly safe to proceed with treatment given her AST and ALT are completely normal and the alk phos is only very slightly elevated at 140 which it has been before and is not as much of an indicator of the liver as the AST and ALT; however, she still declined.  I offered to recheck her liver enzymes next week and get her in for treatment next week or the week after so she does not miss this treatment altogether since she did not receive the last 1, however she prefers to skip this treatment and return in 3 weeks as scheduled.    [name] will return in 3 weeks for labs follow-up, and her next treatment.

## 2022-09-05 ENCOUNTER — Telehealth: Payer: Self-pay | Admitting: Adult Health

## 2022-09-05 NOTE — Telephone Encounter (Signed)
Scheduled and rescheduled appointment per los. Left voicemail.

## 2022-09-15 ENCOUNTER — Ambulatory Visit (HOSPITAL_COMMUNITY): Payer: Managed Care, Other (non HMO)

## 2022-09-18 ENCOUNTER — Ambulatory Visit (HOSPITAL_COMMUNITY)
Admission: RE | Admit: 2022-09-18 | Discharge: 2022-09-18 | Disposition: A | Discharge status: Home / Self Care | Payer: Managed Care, Other (non HMO) | Source: Ambulatory Visit | Attending: Adult Health | Admitting: Adult Health

## 2022-09-18 DIAGNOSIS — Z0189 Encounter for other specified special examinations: Secondary | ICD-10-CM

## 2022-09-18 DIAGNOSIS — C50411 Malignant neoplasm of upper-outer quadrant of right female breast: Secondary | ICD-10-CM | POA: Diagnosis not present

## 2022-09-18 DIAGNOSIS — Z170 Estrogen receptor positive status [ER+]: Secondary | ICD-10-CM | POA: Insufficient documentation

## 2022-09-18 DIAGNOSIS — Z9882 Breast implant status: Secondary | ICD-10-CM | POA: Diagnosis not present

## 2022-09-18 LAB — ECHOCARDIOGRAM COMPLETE

## 2022-09-18 NOTE — Progress Notes (Signed)
  Echocardiogram 2D Echocardiogram has been performed.  [name] 09/18/2022, 11:02 AM

## 2022-09-20 ENCOUNTER — Other Ambulatory Visit: Payer: Self-pay | Admitting: Pharmacy Technician

## 2022-09-21 ENCOUNTER — Other Ambulatory Visit: Payer: Managed Care, Other (non HMO)

## 2022-09-21 ENCOUNTER — Inpatient Hospital Stay: Payer: Managed Care, Other (non HMO)

## 2022-09-21 ENCOUNTER — Inpatient Hospital Stay (HOSPITAL_BASED_OUTPATIENT_CLINIC_OR_DEPARTMENT_OTHER): Payer: Managed Care, Other (non HMO) | Admitting: Adult Health

## 2022-09-21 ENCOUNTER — Inpatient Hospital Stay: Payer: Managed Care, Other (non HMO) | Attending: Hematology and Oncology

## 2022-09-21 ENCOUNTER — Encounter: Payer: Self-pay | Admitting: Adult Health

## 2022-09-21 VITALS — BP 109/78 | HR 97 | Temp 98.1°F | Resp 17

## 2022-09-21 VITALS — BP 112/87 | HR 114 | Temp 97.5°F | Resp 18 | Ht 64.0 in | Wt 126.4 lb

## 2022-09-21 DIAGNOSIS — R000 Tachycardia, unspecified: Secondary | ICD-10-CM | POA: Diagnosis not present

## 2022-09-21 DIAGNOSIS — Z95828 Presence of other vascular implants and grafts: Secondary | ICD-10-CM

## 2022-09-21 DIAGNOSIS — Z5112 Encounter for antineoplastic immunotherapy: Secondary | ICD-10-CM | POA: Insufficient documentation

## 2022-09-21 DIAGNOSIS — C50411 Malignant neoplasm of upper-outer quadrant of right female breast: Secondary | ICD-10-CM

## 2022-09-21 DIAGNOSIS — Z170 Estrogen receptor positive status [ER+]: Secondary | ICD-10-CM | POA: Insufficient documentation

## 2022-09-21 LAB — CBC WITH DIFFERENTIAL (CANCER CENTER ONLY)
Abs Immature Granulocytes: 0.03 10*3/uL (ref 0.00–0.07)
Basophils Absolute: 0.1 10*3/uL (ref 0.0–0.1)
Basophils Relative: 1 %
Eosinophils Absolute: 0 10*3/uL (ref 0.0–0.5)
Eosinophils Relative: 1 %
HCT: 34.6 % — ABNORMAL LOW (ref 36.0–46.0)
Hemoglobin: 10.9 g/dL — ABNORMAL LOW (ref 12.0–15.0)
Immature Granulocytes: 0 %
Lymphocytes Relative: 23 %
Lymphs Abs: 2.1 10*3/uL (ref 0.7–4.0)
MCH: 25.3 pg — ABNORMAL LOW (ref 26.0–34.0)
MCHC: 31.5 g/dL (ref 30.0–36.0)
MCV: 80.3 fL (ref 80.0–100.0)
Monocytes Absolute: 0.8 10*3/uL (ref 0.1–1.0)
Monocytes Relative: 9 %
Neutro Abs: 5.9 10*3/uL (ref 1.7–7.7)
Neutrophils Relative %: 66 %
Platelet Count: 360 10*3/uL (ref 150–400)
RBC: 4.31 MIL/uL (ref 3.87–5.11)
RDW: 15.1 % (ref 11.5–15.5)
WBC Count: 8.8 10*3/uL (ref 4.0–10.5)
nRBC: 0 % (ref 0.0–0.2)

## 2022-09-21 LAB — CMP (CANCER CENTER ONLY)
ALT: 12 U/L (ref 0–44)
AST: 18 U/L (ref 15–41)
Albumin: 3.7 g/dL (ref 3.5–5.0)
Alkaline Phosphatase: 122 U/L (ref 38–126)
Anion gap: 8 (ref 5–15)
BUN: 13 mg/dL (ref 6–20)
CO2: 28 mmol/L (ref 22–32)
Calcium: 9.5 mg/dL (ref 8.9–10.3)
Chloride: 100 mmol/L (ref 98–111)
Creatinine: 0.91 mg/dL (ref 0.44–1.00)
GFR, Estimated: >60 mL/min (ref >60)
Glucose, Bld: 98 mg/dL (ref 70–99)
Potassium: 3.4 mmol/L — ABNORMAL LOW (ref 3.5–5.1)
Sodium: 136 mmol/L (ref 135–145)
Total Bilirubin: 0.4 mg/dL (ref 0.3–1.2)
Total Protein: 7.9 g/dL (ref 6.5–8.1)

## 2022-09-21 MED ORDER — SODIUM CHLORIDE 0.9 % IV SOLN: Freq: Once | INTRAVENOUS | Status: AC

## 2022-09-21 MED ORDER — PROCHLORPERAZINE MALEATE 10 MG PO TABS
10.0000 mg | ORAL_TABLET | Freq: Once | ORAL | Status: AC
  Administered 2022-09-21: 10 mg via ORAL
  Filled 2022-09-21: qty 1

## 2022-09-21 MED ORDER — SODIUM CHLORIDE 0.9% FLUSH
10.0000 mL | Freq: Once | INTRAVENOUS | Status: AC
  Administered 2022-09-21: 10 mL

## 2022-09-21 MED ORDER — ACETAMINOPHEN 325 MG PO TABS
650.0000 mg | ORAL_TABLET | Freq: Once | ORAL | Status: AC
  Administered 2022-09-21: 650 mg via ORAL
  Filled 2022-09-21: qty 2

## 2022-09-21 MED ORDER — DIPHENHYDRAMINE HCL 25 MG PO CAPS
25.0000 mg | ORAL_CAPSULE | Freq: Once | ORAL | Status: AC
  Administered 2022-09-21: 25 mg via ORAL
  Filled 2022-09-21: qty 1

## 2022-09-21 MED ORDER — SODIUM CHLORIDE 0.9 % IV SOLN
2.4000 mg/kg | Freq: Once | INTRAVENOUS | Status: AC
  Administered 2022-09-21: 140 mg via INTRAVENOUS
  Filled 2022-09-21: qty 7

## 2022-09-21 MED ORDER — SODIUM CHLORIDE 0.9% FLUSH
10.0000 mL | INTRAVENOUS | Status: DC | PRN
  Administered 2022-09-21: 10 mL

## 2022-09-21 MED ORDER — HEPARIN SOD (PORK) LOCK FLUSH 100 UNIT/ML IV SOLN
500.0000 [IU] | Freq: Once | INTRAVENOUS | Status: AC | PRN
  Administered 2022-09-21: 500 [IU]

## 2022-09-21 NOTE — Progress Notes (Signed)
[facility] Cancer Follow up:    Patient, [name] No address on file   DIAGNOSIS:  Cancer Staging  Malignant neoplasm of upper-outer quadrant of right breast in female, estrogen receptor positive (HCC) Staging form: Breast, AJCC 8th Edition - Clinical: Stage IIIB (cT4d, cN1, cM0, G3, ER+, PR-, HER2+) - Signed by Serena Croissant, MD on 07/07/2020 Histologic grading system: 3 grade system   SUMMARY OF ONCOLOGIC HISTORY: Oncology History  Malignant neoplasm of upper-outer quadrant of right breast in female, estrogen receptor positive (HCC)  06/03/2020 Mammogram   06/03/2020, US guided biopsy of the right breast 9 0 clock mass showed grade III IDC; Prognostics showed ER 40% positive, weak staining, PR positive 0 %, Her 2 3 +, Ki 67 30%   06/04/2020 Initial Diagnosis   [id number] 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 palpable right breast mass measuring about 7 cm x 5 and half centimeters, palpable right axillary lymph node with concern for skin invasion    06/08/2020 Cancer Staging   Staging form: Breast, AJCC 8th Edition - Clinical: Stage IIIB (cT4d, cN1, cM0, G3, ER+, PR-, HER2+) - Signed by Serena Croissant, MD on 07/07/2020 Histologic grading system: 3 grade system   06/24/2020 Genetic Testing   No pathogenic variants detected in [facility] [employer] +RNAinsight.  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  The report date is June 24, 2020.   The CustomNext-Cancer+RNAinsight panel offered by [employer] includes sequencing and rearrangement analysis for the following 47 genes:  APC, ATM, AXIN2, BARD1, BMPR1A, BRCA1, BRCA2, BRIP1, CDH1, CDK4, CDKN2A, CHEK2, DICER1, EPCAM, GREM1, HOXB13, MEN1, MLH1, MSH2, MSH3, MSH6, MUTYH, NBN, NF1, NF2, NTHL1, PALB2, PMS2, POLD1, POLE, PTEN, RAD51C, RAD51D, RECQL, RET, SDHA, SDHAF2, SDHB, SDHC, SDHD, SMAD4, SMARCA4, STK11, TP53, TSC1, TSC2, and VHL.  RNA data is routinely analyzed for use in variant  interpretation for all genes.   06/25/2020 - 10/29/2020 Chemotherapy   TCHP x6 cycles   11/09/2020 Surgery   Bilateral mastectomies: Left mastectomy: Benign Right mastectomy: Residual microinvasive cancer status post neoadjuvant therapy, DCIS, 0/12 lymph nodes, ER +1%, PR negative, HER2 equivocal 2+ by [facility]   11/19/2020 - 07/15/2021 Chemotherapy   Patient is on Treatment Plan : BREAST Trastuzumab  + Pertuzumab q21d x 13 cycles     01/11/2021 - 03/09/2021 Radiation Therapy   Site Technique Total Dose (Gy) Dose per Fx (Gy) Completed Fx Beam Energies  Chest Wall, Right: CW_Rt 3D 50.4/50.4 1.8 28/28 10X  Chest Wall, Right: CW_Rt_SCLV 3D 50.4/50.4 1.8 28/28 6X, 10X  Chest Wall, Right: CW_Rt_Bst Electron 10/10 2 5/5 6E     06/08/2021 - 06/24/2021 Anti-estrogen oral therapy   Tamoxifen (discontinued)   03/24/2022 Relapse/Recurrence   Right chest palpable nodule: 1.2 cm mass at 10 o'clock position biopsy revealed grade 2 IDC with high-grade DCIS ER 20% positive weak staining, PR 0%, HER2 3+ positive, Ki-67 50%   04/06/2022 Imaging   CT chest/abd/pelvis  IMPRESSION: 1. No evidence of metastatic disease in the chest, abdomen or pelvis. 2. Similar probable postradiation change in the peripheral anterior right upper lobe, anterior right middle lobe, lateral right lower lobe and right lung apex. 3. Surgical changes of subtotal colectomy with short segment wall thickening of the neo terminal ileum and short segments of small bowel throughout the abdomen pelvis, with interposed gas and fluid-filled distended loops of small bowel intermixed between the areas of inflammation. Findings are compatible active inflammatory Crohn's enteritis. 4. Small volume right lower  quadrant and pelvic free fluid, likely reactive.     Electronically Signed   By: Maudry Mayhew M.D.   On: 04/06/2022 12:53     05/02/2022 Surgery   Right lumpectomy: Recurrent grade 3 IDC, posterior margin focally involved by  cancer.   05/17/2022 -  Chemotherapy   Patient is on Treatment Plan : BREAST ADO-Trastuzumab Emtansine (Kadcyla) q21d       CURRENT THERAPY: Kadcyla  INTERVAL HISTORY: [name] 47 y.o. female returns for f/u prior to receiving Kadcyla.  Her most recent echo occurred on 09/18/2022 and demonstrated LVEF of 55-60%.  She skipped her last Kadcyla due to her concern of elevated alk phos of 140.  She noted some impaired relaxation on her most recent echocardiogram.  She also has had slight tachycardia with HR in 110s.  No palpitations.   Patient Active Problem List   Diagnosis Date Noted   Crohn's disease of small and large intestines with complication (HCC)    SBO (small bowel obstruction) (HCC) 01/26/2021   History of Crohn's disease    Breast cancer (HCC) 11/09/2020   Port-A-Cath in place 07/15/2020   Genetic testing 06/25/2020   Family history of breast cancer 06/11/2020   Malignant neoplasm of upper-outer quadrant of right breast in female, estrogen receptor positive (HCC) 06/04/2020   Abnormal finding present on diagnostic imaging of uterus 05/07/2019   Vitamin D deficiency 09/24/2013   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 10/17/2010    is allergic to nsaids and other.  MEDICAL HISTORY: Past Medical History:  Diagnosis Date   Cancer [facility])    Colon stricture (HCC) 05/07/2019   Crohn's colitis, with intestinal obstruction (HCC) 09/17/2013   Dx 2006 - right and left colon involved 2011 - pan-colitis 09/17/2013 left colitis 60-20 cm March 2021 subtotal colectomy ileosigmoid anastomosis for colonic strictures-Dr. 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 10/17/2010   Rectal bleeding    Uterine fibroid    Vitamin D deficiency 09/24/2013    SURGICAL HISTORY: Past Surgical History:  Procedure Laterality Date   AXILLARY LYMPH  NODE DISSECTION Right 11/09/2020   Procedure: RIGHT AXILLARY LYMPH NODE DISSECTION;  Surgeon: Emelia Loron, MD;  Location: [facility];  Service: General;  Laterality: Right;   BIOPSY  07/01/2020   Procedure: BIOPSY;  Surgeon: Benancio Deeds, MD;  Location: [facility];  Service: Gastroenterology;;   BIOPSY  01/27/2021   Procedure: BIOPSY;  Surgeon: Benancio Deeds, MD;  Location: [facility];  Service: Gastroenterology;;   BIOPSY  09/07/2021   Procedure: BIOPSY;  Surgeon: Lynann Bologna, MD;  Location: [facility];  Service: Gastroenterology;;   BREAST BIOPSY Right 03/24/2022   [place] RT BREAST BX W LOC DEV 1ST LESION IMG BX SPEC US GUIDE 03/24/2022 [facility]   BREAST BIOPSY  04/28/2022   [place] RT RADIOACTIVE SEED LOC 04/28/2022 [facility]   BREAST IMPLANT REMOVAL Right 08/07/2022   Procedure: REMOVAL  OF RIGHT BREAST IMPLANTS;  Surgeon: Allena Napoleon, MD;  Location: [facility];  Service: Plastics;  Laterality: Right;   BREAST RECONSTRUCTION WITH PLACEMENT OF TISSUE EXPANDER AND FLEX HD (ACELLULAR HYDRATED DERMIS) Bilateral 11/09/2020   Procedure: BILATERAL BREAST RECONSTRUCTION WITH PLACEMENT OF TISSUE EXPANDER AND FLEX HD (ACELLULAR HYDRATED DERMIS);  Surgeon: Allena Napoleon, MD;  Location: [facility];  Service: Government social research officer;  Laterality: Bilateral;   CAPSULOTOMY Bilateral 07/25/2021   Procedure: CAPSULOTOMY;  Surgeon: Allena Napoleon, MD;  Location: [facility];  Service: Plastics;  Laterality: Bilateral;   COLECTOMY  06/27/2019   COLONOSCOPY  2015   FLEXIBLE SIGMOIDOSCOPY N/A 07/01/2020   Procedure: FLEXIBLE SIGMOIDOSCOPY;  Surgeon: Benancio Deeds, MD;  [facility];  Service: Gastroenterology;  Laterality: N/A;   FLEXIBLE SIGMOIDOSCOPY N/A 01/27/2021   Procedure: FLEXIBLE SIGMOIDOSCOPY;  Surgeon: Benancio Deeds, MD;  [facility];  Service: Gastroenterology;  Laterality: N/A;    FLEXIBLE SIGMOIDOSCOPY N/A 09/07/2021   Procedure: FLEXIBLE SIGMOIDOSCOPY;  Surgeon: Lynann Bologna, MD;  [facility];  Service: Gastroenterology;  Laterality: N/A;   NIPPLE SPARING MASTECTOMY Bilateral 11/09/2020   Procedure: BILATERAL NIPPLE SPARING MASTECTOMY;  Surgeon: Emelia Loron, MD;  Location: [facility];  Service: General;  Laterality: Bilateral;   PORT-A-CATH REMOVAL N/A 07/25/2021   Procedure: REMOVAL PORT-A-CATH;  Surgeon: Allena Napoleon, MD;  Location: [facility];  Service: Plastics;  Laterality: N/A;   PORTACATH PLACEMENT N/A 06/23/2020   Procedure: INSERTION PORT-A-CATH;  Surgeon: Emelia Loron, MD;  Location: [facility];  Service: General;  Laterality: N/A;  START TIME OF 11:00 AM FOR 60 MINUTES [place] [facility]   PORTACATH PLACEMENT Left 05/02/2022   Procedure: INSERTION PORT-A-CATH;  Surgeon: Emelia Loron, MD;  Location: [facility];  Service: General;  Laterality: Left;   RADIOACTIVE SEED GUIDED EXCISIONAL BREAST BIOPSY Right 05/02/2022   Procedure: RADIOACTIVE SEED GUIDED RIGHT BREAST MASS EXCISION;  Surgeon: Emelia Loron, MD;  Location: [facility];  Service: General;  Laterality: Right;   REMOVAL OF BILATERAL TISSUE EXPANDERS WITH PLACEMENT OF BILATERAL BREAST IMPLANTS Bilateral 07/25/2021   Procedure: REMOVAL OF BILATERAL TISSUE EXPANDERS WITH PLACEMENT OF BILATERAL BREAST IMPLANTS;  Surgeon: Allena Napoleon, MD;  Location: [facility];  Service: Plastics;  Laterality: Bilateral;  1.5    SOCIAL HISTORY: Social History   Socioeconomic History   Marital status: Significant Other    Spouse name: Not on file   Number of children: 1   Years of education: 13   Highest education level: Not on file  Occupational History   Occupation: Medical Coder   Tobacco Use   Smoking status: Former    Types: Cigarettes   Smokeless tobacco: Never  Vaping Use   Vaping Use:  Never used  Substance and Sexual Activity   Alcohol use: Yes    Alcohol/week: 1.0 - 2.0 standard drink of alcohol    Types: 1 - 2 Glasses of wine per week   Drug use: No   Sexual activity: Yes    Partners: Male    Comment: Intermittent protection not consistent  Other Topics Concern   Not on file  Social History Narrative   Single, 1 daughter born 2001 approximately   Medical coder for [employer]   She is a former smoker, occasional alcohol no drug use   Social Determinants of Corporate investment banker Strain: Not on file  Food Insecurity: Not on file  Transportation Needs: Not on file  Physical Activity: Not on file  Stress: Not on file  Social Connections: Not on file  Intimate Partner Violence: Not At Risk (11/25/2020)   Humiliation, Afraid, Rape, and Kick questionnaire    Fear of Current or Ex-Partner: No    Emotionally Abused: No    Physically Abused: No    Sexually Abused: No  FAMILY HISTORY: Family History  Problem Relation Age of Onset   Diabetes Father    Breast cancer Maternal Aunt        dx unknown age   Diabetes Paternal Aunt    Breast cancer Cousin        maternal cousin; dx unknown age   Esophageal cancer Neg Hx    Rectal cancer Neg Hx    Stomach cancer Neg Hx    Colon polyps Neg Hx    Colon cancer Neg Hx     Review of Systems  Constitutional:  Negative for appetite change, chills, fatigue, fever and unexpected weight change.  HENT:   Negative for hearing loss, lump/mass and trouble swallowing.   Eyes:  Negative for eye problems and icterus.  Respiratory:  Negative for chest tightness, cough and shortness of breath.   Cardiovascular:  Negative for chest pain, leg swelling and palpitations.  Gastrointestinal:  Negative for abdominal distention, abdominal pain, constipation, diarrhea, nausea and vomiting.  Endocrine: Negative for hot flashes.  Genitourinary:  Negative for difficulty urinating.   Musculoskeletal:  Negative for arthralgias.  Skin:   Negative for itching and rash.  Neurological:  Negative for dizziness, extremity weakness, headaches and numbness.  Hematological:  Negative for adenopathy. Does not bruise/bleed easily.  Psychiatric/Behavioral:  Negative for depression. The patient is not nervous/anxious.       PHYSICAL EXAMINATION   Onc Performance Status - 09/21/22 1210       ECOG Perf Status   ECOG Perf Status Restricted in physically strenuous activity but ambulatory and able to carry out work of a light or sedentary nature, e.g., light house work, office work      KPS SCALE   KPS % SCORE Able to carry on normal activity, minor s/s of disease             Vitals:   09/21/22 1207  BP: 112/87  Pulse: (!) 114  Resp: 18  Temp: (!) 97.5 F (36.4 C)  SpO2: 100%    Physical Exam Constitutional:      General: She is not in acute distress.    Appearance: Normal appearance. She is not toxic-appearing.  HENT:     Head: Normocephalic and atraumatic.     Mouth/Throat:     Mouth: Mucous membranes are moist.     Pharynx: Oropharynx is clear. No oropharyngeal exudate or posterior oropharyngeal erythema.  Eyes:     General: No scleral icterus. Cardiovascular:     Rate and Rhythm: Normal rate and regular rhythm.     Pulses: Normal pulses.     Heart sounds: Normal heart sounds.  Pulmonary:     Effort: Pulmonary effort is normal.     Breath sounds: Normal breath sounds.  Abdominal:     General: Abdomen is flat. Bowel sounds are normal. There is no distension.     Palpations: Abdomen is soft.     Tenderness: There is no abdominal tenderness.  Musculoskeletal:        General: No swelling.     Cervical back: Neck supple.  Lymphadenopathy:     Cervical: No cervical adenopathy.  Skin:    General: Skin is warm and dry.     Findings: No rash.  Neurological:     General: No focal deficit present.     Mental Status: She is alert.  Psychiatric:        Mood and Affect: Mood normal.        Behavior: Behavior  normal.  LABORATORY DATA:  CBC    Component Value Date/Time   WBC 8.8 09/21/2022 1141   WBC 10.3 10/20/2021 1333   RBC 4.31 09/21/2022 1141   HGB 10.9 (L) 09/21/2022 1141   HGB 10.4 (L) 01/21/2020 1056   HCT 34.6 (L) 09/21/2022 1141   HCT 33.9 (L) 01/21/2020 1056   PLT 360 09/21/2022 1141   PLT 346 01/21/2020 1056   MCV 80.3 09/21/2022 1141   MCV 71 (L) 01/21/2020 1056   MCH 25.3 (L) 09/21/2022 1141   MCHC 31.5 09/21/2022 1141   RDW 15.1 09/21/2022 1141   RDW 18.8 (H) 01/21/2020 1056   LYMPHSABS 2.1 09/21/2022 1141   LYMPHSABS 1.3 01/21/2020 1056   MONOABS 0.8 09/21/2022 1141   EOSABS 0.0 09/21/2022 1141   EOSABS 0.0 01/21/2020 1056   BASOSABS 0.1 09/21/2022 1141   BASOSABS 0.0 01/21/2020 1056    CMP     Component Value Date/Time   NA 136 09/21/2022 1141   NA 135 01/21/2020 1056   K 3.4 (L) 09/21/2022 1141   CL 100 09/21/2022 1141   CO2 28 09/21/2022 1141   GLUCOSE 98 09/21/2022 1141   BUN 13 09/21/2022 1141   BUN 12 01/21/2020 1056   CREATININE 0.91 09/21/2022 1141   CALCIUM 9.5 09/21/2022 1141   PROT 7.9 09/21/2022 1141   ALBUMIN 3.7 09/21/2022 1141   AST 18 09/21/2022 1141   ALT 12 09/21/2022 1141   ALKPHOS 122 09/21/2022 1141   BILITOT 0.4 09/21/2022 1141   GFRNONAA >60 09/21/2022 1141   GFRAA 108 01/21/2020 1056      ASSESSMENT and THERAPY PLAN:   Malignant neoplasm of upper-outer quadrant of right breast in female, estrogen receptor positive (HCC) [name] is a 47 year old woman with recurrent HER2 positive breast cancer status postlumpectomy here today for follow-up and evaluation prior to receiving her Kadcyla.  Stage IIIb ER positive HER2 positive breast cancer: She will proceed with Kadcyla today.  Her labs are stable and I reviewed them with her in detail.  Her alk phos decreased to 122.  Tachycardia and echo change: I reviewed with her that it is safe for her to proceed with treatment today with her heart rate and I reviewed her heart  muscle is not weakened.  I did refer her to Dr. Wyline Mood for further cardiac evaluation since she is getting Herceptin and is slightly tachycardic. Next steps: She wants to know about starting Signatera testing now.  I gave her details on Signatera testing and we will send this request to Dr. Pamelia Hoit as he is out of town this week.  She will follow-up with Dr. Pamelia Hoit in 3 weeks prior to her next treatment.     All questions were answered. The patient knows to call the clinic with any problems, questions or concerns. We can certainly see the patient much sooner if necessary.  Total encounter time:20 minutes*in face-to-face visit time, chart review, lab review, care coordination, order entry, and documentation of the encounter time.    Lillard Anes, NP 09/21/22 1:22 PM Medical Oncology and Hematology [facility] [address] [place], [place] [address] Tel. [phone]    Fax. [phone]  *Total Encounter Time as defined by the Centers for Medicare and Medicaid Services includes, in addition to the face-to-face time of a patient visit (documented in the note above) non-face-to-face time: obtaining and reviewing outside history, ordering and reviewing medications, tests or procedures, care coordination (communications with other health care professionals or caregivers) and documentation in  the medical record.

## 2022-09-21 NOTE — Patient Instructions (Signed)
[facility]  Discharge Instructions: Thank you for choosing [facility] to provide your oncology and hematology care.   If you have a lab appointment with the Cancer Center, please go directly to the Cancer Center and check in at the registration area.   Wear comfortable clothing and clothing appropriate for easy access to any Portacath or PICC line.   We strive to give you quality time with your provider. You may need to reschedule your appointment if you arrive late (15 or more minutes).  Arriving late affects you and other patients whose appointments are after yours.  Also, if you miss three or more appointments without notifying the office, you may be dismissed from the clinic at the provider's discretion.      For prescription refill requests, have your pharmacy contact our office and allow 72 hours for refills to be completed.    Today you received the following chemotherapy and/or immunotherapy agents: Kadcyla.       To help prevent nausea and vomiting after your treatment, we encourage you to take your nausea medication as directed.  BELOW ARE SYMPTOMS THAT SHOULD BE REPORTED IMMEDIATELY: *FEVER GREATER THAN 100.4 F (38 C) OR HIGHER *CHILLS OR SWEATING *NAUSEA AND VOMITING THAT IS NOT CONTROLLED WITH YOUR NAUSEA MEDICATION *UNUSUAL SHORTNESS OF BREATH *UNUSUAL BRUISING OR BLEEDING *URINARY PROBLEMS (pain or burning when urinating, or frequent urination) *BOWEL PROBLEMS (unusual diarrhea, constipation, pain near the anus) TENDERNESS IN MOUTH AND THROAT WITH OR WITHOUT PRESENCE OF ULCERS (sore throat, sores in mouth, or a toothache) UNUSUAL RASH, SWELLING OR PAIN  UNUSUAL VAGINAL DISCHARGE OR ITCHING   Items with * indicate a potential emergency and should be followed up as soon as possible or go to the Emergency Department if any problems should occur.  Please show the CHEMOTHERAPY ALERT CARD or IMMUNOTHERAPY ALERT CARD at  check-in to the Emergency Department and triage nurse.  Should you have questions after your visit or need to cancel or reschedule your appointment, please contact [facility]  Dept: [phone]  and follow the prompts.  Office hours are 8:00 a.m. to 4:30 p.m. Monday - Friday. Please note that voicemails left after 4:00 p.m. may not be returned until the following business day.  We are closed weekends and major holidays. You have access to a nurse at all times for urgent questions. Please call the main number to the clinic Dept: [phone] and follow the prompts.   For any non-urgent questions, you may also contact your provider using MyChart. We now offer e-Visits for anyone 18 and older to request care online for non-urgent symptoms. For details visit [email or web address].   Also download the MyChart app! Go to the app store, search "MyChart", open the app, select [facility], and log in with your MyChart username and password.   

## 2022-09-21 NOTE — Assessment & Plan Note (Signed)
[name] is a 47 year old woman with recurrent HER2 positive breast cancer status postlumpectomy here today for follow-up and evaluation prior to receiving her Kadcyla.  Stage IIIb ER positive HER2 positive breast cancer: She will proceed with Kadcyla today.  Her labs are stable and I reviewed them with her in detail.  Her alk phos decreased to 122.  Tachycardia and echo change: I reviewed with her that it is safe for her to proceed with treatment today with her heart rate and I reviewed her heart muscle is not weakened.  I did refer her to Dr. Wyline Mood for further cardiac evaluation since she is getting Herceptin and is slightly tachycardic. Next steps: She wants to know about starting Signatera testing now.  I gave her details on Signatera testing and we will send this request to Dr. Pamelia Hoit as he is out of town this week.  She will follow-up with Dr. Pamelia Hoit in 3 weeks prior to her next treatment.

## 2022-09-21 NOTE — Progress Notes (Signed)
Per LC dose reduce using todays weight, dose 140 mg.  Plan updated with future doses.

## 2022-09-21 NOTE — Progress Notes (Signed)
Per Mardella Layman NP OK to proceed with tx with elevated HR 114 today

## 2022-09-22 ENCOUNTER — Telehealth: Payer: Self-pay | Admitting: Emergency Medicine

## 2022-09-22 ENCOUNTER — Ambulatory Visit: Payer: Managed Care, Other (non HMO)

## 2022-09-22 ENCOUNTER — Ambulatory Visit: Payer: Managed Care, Other (non HMO) | Admitting: Adult Health

## 2022-09-22 ENCOUNTER — Other Ambulatory Visit: Payer: Managed Care, Other (non HMO)

## 2022-09-22 NOTE — Telephone Encounter (Signed)
Called to set up an appt. No answer, left a message stating that an appt was made and asked her to let us know if this appt does not work for her.  09/27/22 at 1130 with Dr Wyline Mood

## 2022-09-22 NOTE — Telephone Encounter (Signed)
Maisie Fus, MD  Loa Socks, NP; Reynolds Bowl, RN Absolutely. Her LV function looks good, I can talk to her.  Jasmine can you please make an appointment for Mrs. Bortz ? Next available.       Previous Messages    ----- Message ----- From: Loa Socks, NP Sent: 09/21/2022  12:39 PM EDT To: Maisie Fus, MD  Can you please get this patient in?  She has some concerns about her echo and the impact of kadcyla on her heart.  She also has some tachycardia.    Thanks so much,  [employer]

## 2022-09-27 ENCOUNTER — Other Ambulatory Visit: Payer: Self-pay

## 2022-09-27 ENCOUNTER — Ambulatory Visit: Payer: Managed Care, Other (non HMO) | Admitting: Internal Medicine

## 2022-09-27 ENCOUNTER — Emergency Department (HOSPITAL_COMMUNITY): Payer: Managed Care, Other (non HMO)

## 2022-09-27 ENCOUNTER — Encounter (HOSPITAL_COMMUNITY): Payer: Self-pay

## 2022-09-27 ENCOUNTER — Encounter: Payer: Self-pay | Admitting: Adult Health

## 2022-09-27 ENCOUNTER — Encounter: Payer: Self-pay | Admitting: Hematology and Oncology

## 2022-09-27 ENCOUNTER — Observation Stay (HOSPITAL_COMMUNITY)
Admission: EM | Admit: 2022-09-27 | Discharge: 2022-09-29 | Disposition: A | Discharge status: Home / Self Care | Payer: Managed Care, Other (non HMO) | Attending: Family Medicine | Admitting: Family Medicine

## 2022-09-27 DIAGNOSIS — K56600 Partial intestinal obstruction, unspecified as to cause: Secondary | ICD-10-CM | POA: Diagnosis not present

## 2022-09-27 DIAGNOSIS — C50411 Malignant neoplasm of upper-outer quadrant of right female breast: Secondary | ICD-10-CM

## 2022-09-27 DIAGNOSIS — Z87891 Personal history of nicotine dependence: Secondary | ICD-10-CM | POA: Diagnosis not present

## 2022-09-27 DIAGNOSIS — Z1152 Encounter for screening for COVID-19: Secondary | ICD-10-CM | POA: Diagnosis not present

## 2022-09-27 DIAGNOSIS — E876 Hypokalemia: Secondary | ICD-10-CM | POA: Diagnosis not present

## 2022-09-27 DIAGNOSIS — K50812 Crohn's disease of both small and large intestine with intestinal obstruction: Secondary | ICD-10-CM | POA: Diagnosis present

## 2022-09-27 DIAGNOSIS — K50819 Crohn's disease of both small and large intestine with unspecified complications: Secondary | ICD-10-CM | POA: Diagnosis present

## 2022-09-27 DIAGNOSIS — R531 Weakness: Secondary | ICD-10-CM | POA: Diagnosis present

## 2022-09-27 DIAGNOSIS — K56609 Unspecified intestinal obstruction, unspecified as to partial versus complete obstruction: Secondary | ICD-10-CM | POA: Diagnosis present

## 2022-09-27 DIAGNOSIS — Z170 Estrogen receptor positive status [ER+]: Secondary | ICD-10-CM | POA: Diagnosis not present

## 2022-09-27 DIAGNOSIS — K529 Noninfective gastroenteritis and colitis, unspecified: Principal | ICD-10-CM | POA: Insufficient documentation

## 2022-09-27 LAB — COMPREHENSIVE METABOLIC PANEL
ALT: 15 U/L (ref 0–44)
AST: 27 U/L (ref 15–41)
Albumin: 3.7 g/dL (ref 3.5–5.0)
Alkaline Phosphatase: 136 U/L — ABNORMAL HIGH (ref 38–126)
Anion gap: 12 (ref 5–15)
BUN: 15 mg/dL (ref 6–20)
CO2: 24 mmol/L (ref 22–32)
Calcium: 9 mg/dL (ref 8.9–10.3)
Chloride: 97 mmol/L — ABNORMAL LOW (ref 98–111)
Creatinine, Ser: 0.98 mg/dL (ref 0.44–1.00)
GFR, Estimated: >60 mL/min (ref >60)
Glucose, Bld: 81 mg/dL (ref 70–99)
Potassium: 3.1 mmol/L — ABNORMAL LOW (ref 3.5–5.1)
Sodium: 133 mmol/L — ABNORMAL LOW (ref 135–145)
Total Bilirubin: 0.9 mg/dL (ref 0.3–1.2)
Total Protein: 8.1 g/dL (ref 6.5–8.1)

## 2022-09-27 LAB — URINALYSIS, ROUTINE W REFLEX MICROSCOPIC
Bilirubin Urine: NEGATIVE
Glucose, UA: NEGATIVE mg/dL
Hgb urine dipstick: NEGATIVE
Ketones, ur: 20 mg/dL — AB
Leukocytes,Ua: NEGATIVE
Nitrite: NEGATIVE
Protein, ur: NEGATIVE mg/dL
Specific Gravity, Urine: 1.017 (ref 1.005–1.030)
pH: 5 (ref 5.0–8.0)

## 2022-09-27 LAB — CBC WITH DIFFERENTIAL/PLATELET
Abs Immature Granulocytes: 0.03 10*3/uL (ref 0.00–0.07)
Basophils Absolute: 0 10*3/uL (ref 0.0–0.1)
Basophils Relative: 0 %
Eosinophils Absolute: 0 10*3/uL (ref 0.0–0.5)
Eosinophils Relative: 0 %
HCT: 36.3 % (ref 36.0–46.0)
Hemoglobin: 11.5 g/dL — ABNORMAL LOW (ref 12.0–15.0)
Immature Granulocytes: 0 %
Lymphocytes Relative: 16 %
Lymphs Abs: 1.8 10*3/uL (ref 0.7–4.0)
MCH: 25.6 pg — ABNORMAL LOW (ref 26.0–34.0)
MCHC: 31.7 g/dL (ref 30.0–36.0)
MCV: 80.7 fL (ref 80.0–100.0)
Monocytes Absolute: 0.8 10*3/uL (ref 0.1–1.0)
Monocytes Relative: 7 %
Neutro Abs: 8.5 10*3/uL — ABNORMAL HIGH (ref 1.7–7.7)
Neutrophils Relative %: 77 %
Platelets: 261 10*3/uL (ref 150–400)
RBC: 4.5 MIL/uL (ref 3.87–5.11)
RDW: 15.3 % (ref 11.5–15.5)
WBC: 11.2 10*3/uL — ABNORMAL HIGH (ref 4.0–10.5)
nRBC: 0 % (ref 0.0–0.2)

## 2022-09-27 LAB — CBC
HCT: 34.1 % — ABNORMAL LOW (ref 36.0–46.0)
Hemoglobin: 10.8 g/dL — ABNORMAL LOW (ref 12.0–15.0)
MCH: 25.4 pg — ABNORMAL LOW (ref 26.0–34.0)
MCHC: 31.7 g/dL (ref 30.0–36.0)
MCV: 80.2 fL (ref 80.0–100.0)
Platelets: 236 10*3/uL (ref 150–400)
RBC: 4.25 MIL/uL (ref 3.87–5.11)
RDW: 15.3 % (ref 11.5–15.5)
WBC: 12.9 10*3/uL — ABNORMAL HIGH (ref 4.0–10.5)
nRBC: 0 % (ref 0.0–0.2)

## 2022-09-27 LAB — SARS CORONAVIRUS 2 BY RT PCR: SARS Coronavirus 2 by RT PCR: NEGATIVE

## 2022-09-27 LAB — LIPASE, BLOOD: Lipase: 28 U/L (ref 11–51)

## 2022-09-27 LAB — CREATININE, SERUM
Creatinine, Ser: 1.11 mg/dL — ABNORMAL HIGH (ref 0.44–1.00)
GFR, Estimated: >60 mL/min (ref >60)

## 2022-09-27 MED ORDER — SODIUM CHLORIDE 0.9 % IV BOLUS
1000.0000 mL | Freq: Once | INTRAVENOUS | Status: AC
  Administered 2022-09-27: 1000 mL via INTRAVENOUS

## 2022-09-27 MED ORDER — SODIUM CHLORIDE 0.9 % IV SOLN
2.0000 g | INTRAVENOUS | Status: DC
  Administered 2022-09-27: 2 g via INTRAVENOUS
  Filled 2022-09-27: qty 20

## 2022-09-27 MED ORDER — METRONIDAZOLE 500 MG/100ML IV SOLN
500.0000 mg | Freq: Two times a day (BID) | INTRAVENOUS | Status: DC
  Filled 2022-09-27 (×2): qty 100

## 2022-09-27 MED ORDER — SODIUM CHLORIDE 0.9% FLUSH: 3.0000 mL | Freq: Two times a day (BID) | INTRAVENOUS | Status: DC

## 2022-09-27 MED ORDER — ENOXAPARIN SODIUM 40 MG/0.4ML IJ SOSY
40.0000 mg | PREFILLED_SYRINGE | INTRAMUSCULAR | Status: DC
  Filled 2022-09-27 (×2): qty 0.4

## 2022-09-27 MED ORDER — ACETAMINOPHEN 325 MG PO TABS
650.0000 mg | ORAL_TABLET | Freq: Four times a day (QID) | ORAL | Status: DC | PRN
  Administered 2022-09-28: 650 mg via ORAL
  Filled 2022-09-27: qty 2

## 2022-09-27 MED ORDER — LACTATED RINGERS IV SOLN: INTRAVENOUS | Status: DC

## 2022-09-27 MED ORDER — IOHEXOL 300 MG/ML  SOLN
100.0000 mL | Freq: Once | INTRAMUSCULAR | Status: AC | PRN
  Administered 2022-09-27: 100 mL via INTRAVENOUS

## 2022-09-27 MED ORDER — POTASSIUM CHLORIDE 20 MEQ PO PACK
40.0000 meq | PACK | Freq: Once | ORAL | Status: DC
  Filled 2022-09-27: qty 2

## 2022-09-27 MED ORDER — ACETAMINOPHEN 650 MG RE SUPP: 650.0000 mg | Freq: Four times a day (QID) | RECTAL | Status: DC | PRN

## 2022-09-27 MED ORDER — POTASSIUM CHLORIDE CRYS ER 20 MEQ PO TBCR
40.0000 meq | EXTENDED_RELEASE_TABLET | Freq: Once | ORAL | Status: AC
  Administered 2022-09-27: 40 meq via ORAL
  Filled 2022-09-27: qty 2

## 2022-09-27 NOTE — H&P (Addendum)
History and Physical    Patient: [name] ZOX:[id number] DOB: [birth year] DOA: 09/27/2022 DOS: the patient was seen and examined on 09/27/2022 PCP: Patient, [name]  Patient coming from: Home  Chief Complaint:  Chief Complaint  Patient presents with   Weakness   HPI: [name] is a 47 y.o. female with medical history significant of static breast cancer.  Patient gets periodic chemotherapy.  After getting chemotherapy patient gets a couple of days of loose stools and poor appetite.  Patient's last chemotherapy was apparently on Thursday.  However patient reports that she has had persistent loose stools 2-3 every day watery brown associated with very poor appetite.  So this has lasted approximately 6 days now.  There is no associated fever.  Patient has a bloated sensation on and off in her belly that she describes before or after she has the diarrhea episodes.  There is no vomiting.  Patient has been tolerating p.o. diet although she has been only eating once a day typically.  She is tolerating fluids.  Is passing gas. Has had only 1 BM since this AM  Patient has continued to work at her job she is not markedly weak.  Patiently patient basically came in today because her diarrhea is lasting longer than it typically does.  She has tried Imodium as prescribed.  ER course is notable for vital stability, CAT scan abdomen has been done as below.  Medical evaluation is sought Review of Systems: As mentioned in the history of present illness. All other systems reviewed and are negative. Past Medical History:  Diagnosis Date   Cancer [facility])    Colon stricture (HCC) 05/07/2019   Crohn's colitis, with intestinal obstruction (HCC) 09/17/2013   Dx 2006 - right and left colon involved 2011 - pan-colitis 09/17/2013 left colitis 60-20 cm March 2021 subtotal colectomy ileosigmoid anastomosis for colonic strictures-Dr. Maisie Fus     Crohn's disease [facility])    Family history of breast cancer  06/11/2020   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 10/17/2010   Rectal bleeding    Uterine fibroid    Vitamin D deficiency 09/24/2013   Past Surgical History:  Procedure Laterality Date   AXILLARY LYMPH NODE DISSECTION Right 11/09/2020   Procedure: RIGHT AXILLARY LYMPH NODE DISSECTION;  Surgeon: Emelia Loron, MD;  Location: [facility];  Service: General;  Laterality: Right;   BIOPSY  07/01/2020   Procedure: BIOPSY;  Surgeon: Benancio Deeds, MD;  Location: [facility];  Service: Gastroenterology;;   BIOPSY  01/27/2021   Procedure: BIOPSY;  Surgeon: Benancio Deeds, MD;  Location: [facility];  Service: Gastroenterology;;   BIOPSY  09/07/2021   Procedure: BIOPSY;  Surgeon: Lynann Bologna, MD;  Location: [facility];  Service: Gastroenterology;;   BREAST BIOPSY Right 03/24/2022   [place] RT BREAST BX W LOC DEV 1ST LESION IMG BX SPEC US GUIDE 03/24/2022 [facility]   BREAST BIOPSY  04/28/2022   [place] RT RADIOACTIVE SEED LOC 04/28/2022 [facility]   BREAST IMPLANT REMOVAL Right 08/07/2022   Procedure: REMOVAL  OF RIGHT BREAST IMPLANTS;  Surgeon: Allena Napoleon, MD;  Location: [facility];  Service: Plastics;  Laterality: Right;   BREAST RECONSTRUCTION WITH PLACEMENT OF TISSUE EXPANDER AND FLEX HD (ACELLULAR HYDRATED DERMIS) Bilateral 11/09/2020   Procedure: BILATERAL BREAST RECONSTRUCTION WITH PLACEMENT OF TISSUE EXPANDER AND FLEX HD (ACELLULAR HYDRATED DERMIS);  Surgeon: Allena Napoleon, MD;  Location: [facility];  Service: Government social research officer;  Laterality: Bilateral;   CAPSULOTOMY Bilateral 07/25/2021   Procedure: CAPSULOTOMY;  Surgeon: Allena Napoleon, MD;  Location: [facility];  Service: Plastics;  Laterality: Bilateral;   COLECTOMY  06/27/2019   COLONOSCOPY  2015   FLEXIBLE SIGMOIDOSCOPY N/A 07/01/2020   Procedure: FLEXIBLE SIGMOIDOSCOPY;  Surgeon: Benancio Deeds, MD;  [facility]: [facility];  Service: Gastroenterology;  Laterality: N/A;   FLEXIBLE SIGMOIDOSCOPY N/A 01/27/2021   Procedure: FLEXIBLE SIGMOIDOSCOPY;  Surgeon: Benancio Deeds, MD;  Location: [facility];  Service: Gastroenterology;  Laterality: N/A;   FLEXIBLE SIGMOIDOSCOPY N/A 09/07/2021   Procedure: FLEXIBLE SIGMOIDOSCOPY;  Surgeon: Lynann Bologna, MD;  Location: [facility];  Service: Gastroenterology;  Laterality: N/A;   NIPPLE SPARING MASTECTOMY Bilateral 11/09/2020   Procedure: BILATERAL NIPPLE SPARING MASTECTOMY;  Surgeon: Emelia Loron, MD;  Location: [facility];  Service: General;  Laterality: Bilateral;   PORT-A-CATH REMOVAL N/A 07/25/2021   Procedure: REMOVAL PORT-A-CATH;  Surgeon: Allena Napoleon, MD;  Location: [facility];  Service: Plastics;  Laterality: N/A;   PORTACATH PLACEMENT N/A 06/23/2020   Procedure: INSERTION PORT-A-CATH;  Surgeon: Emelia Loron, MD;  Location: [facility];  Service: General;  Laterality: N/A;  START TIME OF 11:00 AM FOR 60 MINUTES [place] [facility]   PORTACATH PLACEMENT Left 05/02/2022   Procedure: INSERTION PORT-A-CATH;  Surgeon: Emelia Loron, MD;  Location: [facility];  Service: General;  Laterality: Left;   RADIOACTIVE SEED GUIDED EXCISIONAL BREAST BIOPSY Right 05/02/2022   Procedure: RADIOACTIVE SEED GUIDED RIGHT BREAST MASS EXCISION;  Surgeon: Emelia Loron, MD;  Location: [facility];  Service: General;  Laterality: Right;   REMOVAL OF BILATERAL TISSUE EXPANDERS WITH PLACEMENT OF BILATERAL BREAST IMPLANTS Bilateral 07/25/2021   Procedure: REMOVAL OF BILATERAL TISSUE EXPANDERS WITH PLACEMENT OF BILATERAL BREAST IMPLANTS;  Surgeon: Allena Napoleon, MD;  Location: [facility];  Service: Plastics;  Laterality: Bilateral;  1.5   Social History:  reports that she has quit smoking. Her smoking use included cigarettes. She has never used smokeless tobacco. She reports current  alcohol use of about 1.0 - 2.0 standard drink of alcohol per week. She reports that she does not use drugs.  Allergies  Allergen Reactions   Nsaids Other (See Comments)    Crohns disease/ IBD   Other     BLOOD PRODUCT REFUSAL    Family History  Problem Relation Age of Onset   Diabetes Father    Breast cancer Maternal Aunt        dx unknown age   Diabetes Paternal Aunt    Breast cancer Cousin        maternal cousin; dx unknown age   Esophageal cancer Neg Hx    Rectal cancer Neg Hx    Stomach cancer Neg Hx    Colon polyps Neg Hx    Colon cancer Neg Hx     Prior to Admission medications   Medication Sig Start Date End Date Taking? Authorizing Provider  acetaminophen (TYLENOL) 500 MG tablet Take 500-1,000 mg by mouth every 6 (six) hours as needed for mild pain.   Yes [provider]  Multiple Vitamins-Minerals (MULTIVITAMIN WITH MINERALS) tablet Take 1 tablet by mouth daily.   Yes [provider]  lidocaine-prilocaine (EMLA) cream Apply to affected area once Patient not taking: Reported on 09/27/2022 04/26/22   Serena Croissant, MD  potassium chloride SA (KLOR-CON M) 20 MEQ tablet Take 1 tablet (20 mEq total) by  mouth daily. Patient not taking: Reported on 09/27/2022 12/27/21   Iva Boop, MD    Physical Exam: Vitals:   09/27/22 1700 09/27/22 1702 09/27/22 1900 09/27/22 2000  BP: 129/89  124/89   Pulse: (!) 108  96   Resp: 17  (!) 21   Temp: 98.2 F (36.8 C)   98 F (36.7 C)  TempSrc: Oral     SpO2: 100%  100%   Weight:  57 kg    Height:  5\' 4"  (1.626 m)     General: Thin lady, no distress apparent. Non toxic. Respiratory exam: Bilateral intravesicular there is a subclavian port beneath the skin. Respiratory cardiovascular exam S1-S2 normal Abdomen all quadrants soft bowel sounds are normal to diminished.  Extremities warm without edema, moving all to direction No focal deficit Data Reviewed:  Labs on Admission:  Results for orders placed or  performed during the hospital encounter of 09/27/22 (from the past 24 hour(s))  CBC with Differential     Status: Abnormal   Collection Time: 09/27/22  6:49 PM  Result Value Ref Range   WBC 11.2 (H) 4.0 - 10.5 K/uL   RBC 4.50 3.87 - 5.11 MIL/uL   Hemoglobin 11.5 (L) 12.0 - 15.0 g/dL   HCT 16.1 09.6 - 04.5 %   MCV 80.7 80.0 - 100.0 fL   MCH 25.6 (L) 26.0 - 34.0 pg   MCHC 31.7 30.0 - 36.0 g/dL   RDW 40.9 81.1 - 91.4 %   Platelets 261 150 - 400 K/uL   nRBC 0.0 0.0 - 0.2 %   Neutrophils Relative % 77 %   Neutro Abs 8.5 (H) 1.7 - 7.7 K/uL   Lymphocytes Relative 16 %   Lymphs Abs 1.8 0.7 - 4.0 K/uL   Monocytes Relative 7 %   Monocytes Absolute 0.8 0.1 - 1.0 K/uL   Eosinophils Relative 0 %   Eosinophils Absolute 0.0 0.0 - 0.5 K/uL   Basophils Relative 0 %   Basophils Absolute 0.0 0.0 - 0.1 K/uL   Immature Granulocytes 0 %   Abs Immature Granulocytes 0.03 0.00 - 0.07 K/uL  Comprehensive metabolic panel     Status: Abnormal   Collection Time: 09/27/22  6:49 PM  Result Value Ref Range   Sodium 133 (L) 135 - 145 mmol/L   Potassium 3.1 (L) 3.5 - 5.1 mmol/L   Chloride 97 (L) 98 - 111 mmol/L   CO2 24 22 - 32 mmol/L   Glucose, Bld 81 70 - 99 mg/dL   BUN 15 6 - 20 mg/dL   Creatinine, Ser 7.82 0.44 - 1.00 mg/dL   Calcium 9.0 8.9 - 95.6 mg/dL   Total Protein 8.1 6.5 - 8.1 g/dL   Albumin 3.7 3.5 - 5.0 g/dL   AST 27 15 - 41 U/L   ALT 15 0 - 44 U/L   Alkaline Phosphatase 136 (H) 38 - 126 U/L   Total Bilirubin 0.9 0.3 - 1.2 mg/dL   GFR, Estimated >21 >30 mL/min   Anion gap 12 5 - 15  Lipase, blood     Status: None   Collection Time: 09/27/22  6:49 PM  Result Value Ref Range   Lipase 28 11 - 51 U/L  Urinalysis, Routine w reflex microscopic -Urine, Clean Catch     Status: Abnormal   Collection Time: 09/27/22  6:49 PM  Result Value Ref Range   Color, Urine YELLOW YELLOW   APPearance CLEAR CLEAR   Specific Gravity, Urine 1.017 1.005 -  1.030   pH 5.0 5.0 - 8.0   Glucose, UA NEGATIVE  NEGATIVE mg/dL   Hgb urine dipstick NEGATIVE NEGATIVE   Bilirubin Urine NEGATIVE NEGATIVE   Ketones, ur 20 (A) NEGATIVE mg/dL   Protein, ur NEGATIVE NEGATIVE mg/dL   Nitrite NEGATIVE NEGATIVE   Leukocytes,Ua NEGATIVE NEGATIVE   Basic Metabolic Panel: Recent Labs  Lab 09/21/22 1141 09/27/22 1849  NA 136 133*  K 3.4* 3.1*  CL 100 97*  CO2 28 24  GLUCOSE 98 81  BUN 13 15  CREATININE 0.91 0.98  CALCIUM 9.5 9.0   Liver Function Tests: Recent Labs  Lab 09/21/22 1141 09/27/22 1849  AST 18 27  ALT 12 15  ALKPHOS 122 136*  BILITOT 0.4 0.9  PROT 7.9 8.1  ALBUMIN 3.7 3.7   Recent Labs  Lab 09/27/22 1849  LIPASE 28   No results for input(s): "AMMONIA" in the last 168 hours. CBC: Recent Labs  Lab 09/21/22 1141 09/27/22 1849  WBC 8.8 11.2*  NEUTROABS 5.9 8.5*  HGB 10.9* 11.5*  HCT 34.6* 36.3  MCV 80.3 80.7  PLT 360 261   Cardiac Enzymes: No results for input(s): "CKTOTAL", "CKMB", "CKMBINDEX", "TROPONINIHS" in the last 168 hours.  BNP (last 3 results) No results for input(s): "PROBNP" in the last 8760 hours. CBG: No results for input(s): "GLUCAP" in the last 168 hours.  Radiological Exams on Admission:  CT ABDOMEN PELVIS W CONTRAST  Result Date: 09/27/2022 CLINICAL DATA:  Abdominal bloating, nausea, diarrhea. EXAM: CT ABDOMEN AND PELVIS WITH CONTRAST TECHNIQUE: Multidetector CT imaging of the abdomen and pelvis was performed using the standard protocol following bolus administration of intravenous contrast. RADIATION DOSE REDUCTION: This exam was performed according to the departmental dose-optimization program which includes automated exposure control, adjustment of the mA and/or kV according to patient size and/or use of iterative reconstruction technique. CONTRAST:  OMNIPAQUE IOHEXOL 300 MG/ML  SOLN COMPARISON:  April 06, 2022. FINDINGS: Lower chest: No acute abnormality. Hepatobiliary: No cholelithiasis or biliary dilatation is noted. Probable 2 hepatic  cysts are seen in right hepatic lobe. Pancreas: Unremarkable. No pancreatic ductal dilatation or surrounding inflammatory changes. Spleen: Normal in size without focal abnormality. Adrenals/Urinary Tract: Adrenal glands are unremarkable. Kidneys are normal, without renal calculi, focal lesion, or hydronephrosis. Bladder is unremarkable. Stomach/Bowel: Stomach is unremarkable. Status post subtotal colectomy. There is severe diffuse small bowel dilatation with severe narrowing or transition zone seen in the pelvis best seen on image number 57 of series 2. It is uncertain if this is inflammatory or neoplastic in etiology. Vascular/Lymphatic: No significant vascular findings are present. No enlarged abdominal or pelvic lymph nodes. Reproductive: Continued presence of enlarged fibroid uterus. Other: No ascites or hernia is noted. Musculoskeletal: No acute or significant osseous findings. IMPRESSION: Status post subtotal colectomy. Severe diffuse small bowel dilatation is noted with severe narrowing or transition zone seen in the pelvis consistent with small bowel obstruction. It is uncertain if this is inflammatory or neoplastic in etiology. Enlarged fibroid uterus. Electronically Signed   By: Lupita Raider M.D.   On: 09/27/2022 20:36    EKG: Independently reviewed. There is no significant ST elevation.    Assessment and Plan: Enteritis Given the entirety of patient's clinical picture of diarrhea, intermittent stomach bloating, elevated white count, clinical picture most consistent with enteritis.  CAT scan finding likely represents enteritis related small bowel dilation with possible peristaltic movement interpreted as stricture.  Patient has not been throwing up, but tolerating diet and passing  bowel movements.  At this time I will hold off on nasogastric tube.  Patient is actually hungry.  We will treat the patient with ceftriaxone metronidazole and check stool for  pathogen  ______________________________________________________________________ 10:18 PM. Patient declined iv antibiotics till she researchs ceftriaxone/metronidazole. Will let us know when ready for abx or if has any more questions. Diagnostic impression and careplan were discussed with aptient during initial encounter and again now. RN at the bedside.    Advance Care Planning:   Code Status: Prior full code.  Consults: general surgery will see patient in AM. Case was discussed with them by ER provider.  Family Communication: per patient.  Severity of Illness: The appropriate patient status for this patient is OBSERVATION. Observation status is judged to be reasonable and necessary in order to provide the required intensity of service to ensure the patient's safety. The patient's presenting symptoms, physical exam findings, and initial radiographic and laboratory data in the context of their medical condition is felt to place them at decreased risk for further clinical deterioration. Furthermore, it is anticipated that the patient will be medically stable for discharge from the hospital within 2 midnights of admission.   Author: Nolberto Hanlon, MD 09/27/2022 9:06 PM  For on call review [email or web address].

## 2022-09-27 NOTE — Assessment & Plan Note (Addendum)
Given the entirety of patient's clinical picture of diarrhea, intermittent stomach bloating, elevated white count, clinical picture most consistent with enteritis.  CAT scan finding likely represents enteritis related small bowel dilation with possible peristaltic movement interpreted as stricture.  Patient has not been throwing up, but tolerating diet and passing bowel movements.  At this time I will hold off on nasogastric tube.  Patient is actually hungry.  We will treat the patient with ceftriaxone metronidazole and check stool for pathogen  Patient reports a weight loss of about 8 pounds over the course of her illness in the last 5 days.  This is most consistent with dehydration.  I will give the patient IV fluids

## 2022-09-27 NOTE — ED Triage Notes (Signed)
Pt reports loss of appetite since last chemo last Thursday. Also c/o bloating,nausea and diarrhea

## 2022-09-27 NOTE — ED Provider Notes (Addendum)
[place] EMERGENCY DEPARTMENT AT [facility] Provider Note   CSN: [id number] Arrival date & time: 09/27/22  1654     History  Chief Complaint  Patient presents with   Weakness    [name] is a 47 y.o. female history of SBO, Crohn's disease, malignant neoplasm of breast presented with decreased appetite, bloating, nausea for 6 days.  Patient states that she had a chemo treatment last Thursday and since then has been feeling progressively more ill.  Patient states that over the past 3 days she is only eating 1 meal when she normally eats 3 to 4-day.  Patient states that she has not been drinking fluid at all and has the thought of drinking or eating makes her nauseous.  Patient denies any emesis.  Patient does endorse overall weakness.  Patient denies chest pain, shortness of breath, headache, vision changes, LOC  Home Medications Prior to Admission medications   Medication Sig Start Date End Date Taking? Authorizing Provider  acetaminophen (TYLENOL) 500 MG tablet Take 500-1,000 mg by mouth every 6 (six) hours as needed for mild pain.   Yes [provider]  Multiple Vitamins-Minerals (MULTIVITAMIN WITH MINERALS) tablet Take 1 tablet by mouth daily.   Yes [provider]  lidocaine-prilocaine (EMLA) cream Apply to affected area once Patient not taking: Reported on 09/27/2022 04/26/22   Serena Croissant, MD  potassium chloride SA (KLOR-CON M) 20 MEQ tablet Take 1 tablet (20 mEq total) by mouth daily. Patient not taking: Reported on 09/27/2022 12/27/21   Iva Boop, MD      Allergies    Nsaids and Other    Review of Systems   Review of Systems  Neurological:  Positive for weakness.    Physical Exam Updated Vital Signs BP 124/89   Pulse 96   Temp 98 F (36.7 C)   Resp (!) 21   Ht 5\' 4"  (1.626 m)   Wt 57 kg   LMP 04/24/2021   SpO2 100%   BMI 21.57 kg/m  Physical Exam Vitals reviewed.  Constitutional:      General: She is not in acute  distress.    Comments: Thin appearing  HENT:     Head: Normocephalic and atraumatic.  Eyes:     Extraocular Movements: Extraocular movements intact.     Conjunctiva/sclera: Conjunctivae normal.     Pupils: Pupils are equal, round, and reactive to light.  Cardiovascular:     Rate and Rhythm: Normal rate and regular rhythm.     Pulses: Normal pulses.     Heart sounds: Normal heart sounds.     Comments: 2+ bilateral radial/dorsalis pedis pulses with regular rate Pulmonary:     Effort: Pulmonary effort is normal. No respiratory distress.     Breath sounds: Normal breath sounds.  Abdominal:     Palpations: Abdomen is soft.     Tenderness: There is no abdominal tenderness. There is no guarding or rebound.  Musculoskeletal:        General: Normal range of motion.     Cervical back: Normal range of motion and neck supple.     Comments: 5 out of 5 bilateral grip/leg extension strength  Skin:    General: Skin is warm and dry.     Capillary Refill: Capillary refill takes less than 2 seconds.  Neurological:     General: No focal deficit present.     Mental Status: She is alert and oriented to person, place, and time.  Comments: Sensation intact in all 4 limbs  Psychiatric:        Mood and Affect: Mood normal.     ED Results / Procedures / Treatments   Labs (all labs ordered are listed, but only abnormal results are displayed) Labs Reviewed  CBC WITH DIFFERENTIAL/PLATELET - Abnormal; Notable for the following components:      Result Value   WBC 11.2 (*)    Hemoglobin 11.5 (*)    MCH 25.6 (*)    Neutro Abs 8.5 (*)    All other components within normal limits  COMPREHENSIVE METABOLIC PANEL - Abnormal; Notable for the following components:   Sodium 133 (*)    Potassium 3.1 (*)    Chloride 97 (*)    Alkaline Phosphatase 136 (*)    All other components within normal limits  URINALYSIS, ROUTINE W REFLEX MICROSCOPIC - Abnormal; Notable for the following components:   Ketones, ur  20 (*)    All other components within normal limits  LIPASE, BLOOD    EKG None  Radiology CT ABDOMEN PELVIS W CONTRAST  Result Date: 09/27/2022 CLINICAL DATA:  Abdominal bloating, nausea, diarrhea. EXAM: CT ABDOMEN AND PELVIS WITH CONTRAST TECHNIQUE: Multidetector CT imaging of the abdomen and pelvis was performed using the standard protocol following bolus administration of intravenous contrast. RADIATION DOSE REDUCTION: This exam was performed according to the departmental dose-optimization program which includes automated exposure control, adjustment of the mA and/or kV according to patient size and/or use of iterative reconstruction technique. CONTRAST:  OMNIPAQUE IOHEXOL 300 MG/ML  SOLN COMPARISON:  April 06, 2022. FINDINGS: Lower chest: No acute abnormality. Hepatobiliary: No cholelithiasis or biliary dilatation is noted. Probable 2 hepatic cysts are seen in right hepatic lobe. Pancreas: Unremarkable. No pancreatic ductal dilatation or surrounding inflammatory changes. Spleen: Normal in size without focal abnormality. Adrenals/Urinary Tract: Adrenal glands are unremarkable. Kidneys are normal, without renal calculi, focal lesion, or hydronephrosis. Bladder is unremarkable. Stomach/Bowel: Stomach is unremarkable. Status post subtotal colectomy. There is severe diffuse small bowel dilatation with severe narrowing or transition zone seen in the pelvis best seen on image number 57 of series 2. It is uncertain if this is inflammatory or neoplastic in etiology. Vascular/Lymphatic: No significant vascular findings are present. No enlarged abdominal or pelvic lymph nodes. Reproductive: Continued presence of enlarged fibroid uterus. Other: No ascites or hernia is noted. Musculoskeletal: No acute or significant osseous findings. IMPRESSION: Status post subtotal colectomy. Severe diffuse small bowel dilatation is noted with severe narrowing or transition zone seen in the pelvis consistent with small  bowel obstruction. It is uncertain if this is inflammatory or neoplastic in etiology. Enlarged fibroid uterus. Electronically Signed   By: Lupita Raider M.D.   On: 09/27/2022 20:36    Procedures Procedures    Medications Ordered in ED Medications  potassium chloride SA (KLOR-CON M) CR tablet 40 mEq (40 mEq Oral Given 09/27/22 2032)  iohexol (OMNIPAQUE) 300 MG/ML solution 100 mL (100 mLs Intravenous Contrast Given 09/27/22 2008)    ED Course/ Medical Decision Making/ A&P                             Medical Decision Making Amount and/or Complexity of Data Reviewed Labs: ordered. Radiology: ordered.  Risk Prescription drug management. Decision regarding hospitalization.   [name] 47 y.o. presented today for nausea, bloating, weakness. Working DDx that I considered at this time includes, but not limited to, SBO, metastasis,  electrolyte imbalance, anemia, dehydration, bowel perforation, AAA.  R/o DDx: metastasis, anemia, dehydration, gastroparesis, bowel perforation, AAA: These are considered less likely due to history of present illness and physical exam findings.  Review of prior external notes: 09/21/2022 office visit  Unique Tests and My Interpretation:  CBC with differential: Mild leukocytosis 11.2 CMP: Hypokalemia 3.1 Lipase: None UA: Unremarkable EKG: Rate, rhythm, axis, intervals all examined and without medically relevant abnormality. ST segments without concerns for elevations CT Abd/Pelvis with contrast: Severe SBO, uterine fibroid  Discussion with Independent Historian: None  Discussion of Management of Tests:  Cornett, MD Gen Surg  Risk: High: hospitalization or escalation of hospital-level care  Risk Stratification Score: none  Plan: On exam patient was in no acute distress but slightly tachycardic 108.  Patient had generalized tenderness throughout her abdomen however no peritoneal signs no distention noted.  Patient good pulse motor sensation  distally in all 4 limbs and the rest of her physical exam is unremarkable.  Labs and imaging will be ordered.  Patient be given fluids.  Patient stable at this time.  Patient did have slightly low potassium and was given a potassium pill to replenish this.  Patient CT shows small bowel obstruction.  I spoke to the general surgeon who said they would be available for consult and to admit to medicine in the meantime to place an NG tube.  NG tube was ordered and hospitalist was consulted and accepted patient for admission.  Patient stable for admission at this time.   After patient was admitted to medicine medicine stated they wanted the NG tube to be discontinued as patient is not currently vomiting or showing signs of emergently needing NG tube.  NG tube will be discontinued at the hospitalist request.      Final Clinical Impression(s) / ED Diagnoses Final diagnoses:  Hypokalemia  SBO (small bowel obstruction) [facility])    Rx / DC Orders ED Discharge Orders     None         Remi Deter 09/27/22 2057    Netta Corrigan, PA-C 09/27/22 2106    Tegeler, Canary Brim, MD 09/27/22 [phone]

## 2022-09-27 NOTE — Consult Note (Signed)
Reason for Consult: Small bowel obstruction Referring Physician: Maryjean Ka MD  [name] is an 47 y.o. female.  HPI: Pleasant 47 year old female with metastatic breast cancer on maintenance chemotherapy presents with 3-day history of abdominal distention, loss of appetite abdominal pain.  A CT scan shows a distal partial small bowel obstruction.  She is tolerating bowel movements.  She has a history of a subtotal colectomy due to Crohn's back in 2015 with an ileosigmoid anastomosis.  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  She recurred in 2023 and now on systemic chemotherapy.  CT scan shows small bowel obstruction and surgical consultation was asked for.  Currently, she has no nausea vomiting.  She has had no vomiting whatsoever.  Her bowels are moving with loose stool.  She is at least 2.  Complains mostly of loss of appetite distention.  Past Medical History:  Diagnosis Date   Cancer [facility])    Colon stricture (HCC) 05/07/2019   Crohn's colitis, with intestinal obstruction (HCC) 09/17/2013   Dx 2006 - right and left colon involved 2011 - pan-colitis 09/17/2013 left colitis 60-20 cm March 2021 subtotal colectomy ileosigmoid anastomosis for colonic strictures-Dr. Maisie Fus     Crohn's disease [facility])    Family history of breast cancer 06/11/2020   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 10/17/2010   Rectal bleeding    Uterine fibroid    Vitamin D deficiency 09/24/2013    Past Surgical History:  Procedure Laterality Date   AXILLARY LYMPH NODE DISSECTION Right 11/09/2020   Procedure: RIGHT AXILLARY LYMPH NODE DISSECTION;  Surgeon: Emelia Loron, MD;  Location: [facility];  Service: General;  Laterality: Right;   BIOPSY  07/01/2020   Procedure: BIOPSY;  Surgeon: Benancio Deeds, MD;  Location: [facility];  Service: Gastroenterology;;   BIOPSY   01/27/2021   Procedure: BIOPSY;  Surgeon: Benancio Deeds, MD;  Location: [facility];  Service: Gastroenterology;;   BIOPSY  09/07/2021   Procedure: BIOPSY;  Surgeon: Lynann Bologna, MD;  Location: [facility];  Service: Gastroenterology;;   BREAST BIOPSY Right 03/24/2022   [place] RT BREAST BX W LOC DEV 1ST LESION IMG BX SPEC US GUIDE 03/24/2022 [facility]   BREAST BIOPSY  04/28/2022   [place] RT RADIOACTIVE SEED LOC 04/28/2022 [facility]   BREAST IMPLANT REMOVAL Right 08/07/2022   Procedure: REMOVAL  OF RIGHT BREAST IMPLANTS;  Surgeon: Allena Napoleon, MD;  Location: [facility];  Service: Plastics;  Laterality: Right;   BREAST RECONSTRUCTION WITH PLACEMENT OF TISSUE EXPANDER AND FLEX HD (ACELLULAR HYDRATED DERMIS) Bilateral 11/09/2020   Procedure: BILATERAL BREAST RECONSTRUCTION WITH PLACEMENT OF TISSUE EXPANDER AND FLEX HD (ACELLULAR HYDRATED DERMIS);  Surgeon: Allena Napoleon, MD;  Location: [facility];  Service: Plastics;  Laterality: Bilateral;   CAPSULOTOMY Bilateral 07/25/2021   Procedure: CAPSULOTOMY;  Surgeon: Allena Napoleon, MD;  Location: [facility];  Service: Plastics;  Laterality: Bilateral;   COLECTOMY  06/27/2019   COLONOSCOPY  2015   FLEXIBLE SIGMOIDOSCOPY N/A 07/01/2020   Procedure: FLEXIBLE SIGMOIDOSCOPY;  Surgeon: Benancio Deeds, MD;  Location: [facility];  Service: Gastroenterology;  Laterality: N/A;   FLEXIBLE SIGMOIDOSCOPY N/A 01/27/2021   Procedure: FLEXIBLE SIGMOIDOSCOPY;  Surgeon: Benancio Deeds, MD;  Location: [facility];  Service: Gastroenterology;  Laterality: N/A;   FLEXIBLE SIGMOIDOSCOPY N/A 09/07/2021   Procedure: FLEXIBLE SIGMOIDOSCOPY;  Surgeon: Lynann Bologna, MD;  Location: [facility];  Service: Gastroenterology;  Laterality: N/A;   NIPPLE SPARING MASTECTOMY Bilateral 11/09/2020   Procedure: BILATERAL NIPPLE SPARING MASTECTOMY;  Surgeon: Emelia Loron, MD;  Location: [facility];  Service: General;  Laterality: Bilateral;   PORT-A-CATH REMOVAL N/A 07/25/2021   Procedure: REMOVAL PORT-A-CATH;  Surgeon: Allena Napoleon, MD;  Location: [facility];  Service: Plastics;  Laterality: N/A;   PORTACATH PLACEMENT N/A 06/23/2020   Procedure: INSERTION PORT-A-CATH;  Surgeon: Emelia Loron, MD;  Location: [facility];  Service: General;  Laterality: N/A;  START TIME OF 11:00 AM FOR 60 MINUTES [place] [facility]   PORTACATH PLACEMENT Left 05/02/2022   Procedure: INSERTION PORT-A-CATH;  Surgeon: Emelia Loron, MD;  Location: [facility];  Service: General;  Laterality: Left;   RADIOACTIVE SEED GUIDED EXCISIONAL BREAST BIOPSY Right 05/02/2022   Procedure: RADIOACTIVE SEED GUIDED RIGHT BREAST MASS EXCISION;  Surgeon: Emelia Loron, MD;  Location: [facility];  Service: General;  Laterality: Right;   REMOVAL OF BILATERAL TISSUE EXPANDERS WITH PLACEMENT OF BILATERAL BREAST IMPLANTS Bilateral 07/25/2021   Procedure: REMOVAL OF BILATERAL TISSUE EXPANDERS WITH PLACEMENT OF BILATERAL BREAST IMPLANTS;  Surgeon: Allena Napoleon, MD;  Location: [facility];  Service: Plastics;  Laterality: Bilateral;  1.5    Family History  Problem Relation Age of Onset   Diabetes Father    Breast cancer Maternal Aunt        dx unknown age   Diabetes Paternal Aunt    Breast cancer Cousin        maternal cousin; dx unknown age   Esophageal cancer Neg Hx    Rectal cancer Neg Hx    Stomach cancer Neg Hx    Colon polyps Neg Hx    Colon cancer Neg Hx     Social History:  reports that she has quit smoking. Her smoking use included cigarettes. She has never used smokeless tobacco. She reports current alcohol use of about 1.0 - 2.0 standard drink of alcohol per week. She reports that she does not use drugs.  Allergies:  Allergies  Allergen Reactions   Nsaids Other (See Comments)    Crohns disease/ IBD   Other     BLOOD  PRODUCT REFUSAL    Medications: I have reviewed the patient's current medications.  Results for orders placed or performed during the hospital encounter of 09/27/22 (from the past 48 hour(s))  CBC with Differential     Status: Abnormal   Collection Time: 09/27/22  6:49 PM  Result Value Ref Range   WBC 11.2 (H) 4.0 - 10.5 K/uL   RBC 4.50 3.87 - 5.11 MIL/uL   Hemoglobin 11.5 (L) 12.0 - 15.0 g/dL   HCT 40.9 81.1 - 91.4 %   MCV 80.7 80.0 - 100.0 fL   MCH 25.6 (L) 26.0 - 34.0 pg   MCHC 31.7 30.0 - 36.0 g/dL   RDW 78.2 95.6 - 21.3 %   Platelets 261 150 - 400 K/uL   nRBC 0.0 0.0 - 0.2 %   Neutrophils Relative % 77 %   Neutro Abs 8.5 (H) 1.7 - 7.7 K/uL   Lymphocytes Relative 16 %   Lymphs Abs 1.8 0.7 - 4.0 K/uL   Monocytes Relative 7 %   Monocytes Absolute 0.8 0.1 - 1.0 K/uL   Eosinophils Relative 0 %   Eosinophils Absolute 0.0 0.0 - 0.5 K/uL   Basophils Relative 0 %   Basophils Absolute 0.0 0.0 -  0.1 K/uL   Immature Granulocytes 0 %   Abs Immature Granulocytes 0.03 0.00 - 0.07 K/uL    Comment: Performed at [facility], [address] [place], [place] [address]  Comprehensive metabolic panel     Status: Abnormal   Collection Time: 09/27/22  6:49 PM  Result Value Ref Range   Sodium 133 (L) 135 - 145 mmol/L   Potassium 3.1 (L) 3.5 - 5.1 mmol/L   Chloride 97 (L) 98 - 111 mmol/L   CO2 24 22 - 32 mmol/L   Glucose, Bld 81 70 - 99 mg/dL    Comment: Glucose reference range applies only to samples taken after fasting for at least 8 hours.   BUN 15 6 - 20 mg/dL   Creatinine, Ser 6.04 0.44 - 1.00 mg/dL   Calcium 9.0 8.9 - 54.0 mg/dL   Total Protein 8.1 6.5 - 8.1 g/dL   Albumin 3.7 3.5 - 5.0 g/dL   AST 27 15 - 41 U/L   ALT 15 0 - 44 U/L   Alkaline Phosphatase 136 (H) 38 - 126 U/L   Total Bilirubin 0.9 0.3 - 1.2 mg/dL   GFR, Estimated >98 >11 mL/min    Comment: (NOTE) Calculated using the CKD-EPI Creatinine Equation (2021)    Anion gap 12 5 - 15    Comment:  Performed at [facility], [address], [place], [place] [address]  Lipase, blood     Status: None   Collection Time: 09/27/22  6:49 PM  Result Value Ref Range   Lipase 28 11 - 51 U/L    Comment: Performed at [facility], [address] [place], [place] [address]  Urinalysis, Routine w reflex microscopic -Urine, Clean Catch     Status: Abnormal   Collection Time: 09/27/22  6:49 PM  Result Value Ref Range   Color, Urine YELLOW YELLOW   APPearance CLEAR CLEAR   Specific Gravity, Urine 1.017 1.005 - 1.030   pH 5.0 5.0 - 8.0   Glucose, UA NEGATIVE NEGATIVE mg/dL   Hgb urine dipstick NEGATIVE NEGATIVE   Bilirubin Urine NEGATIVE NEGATIVE   Ketones, ur 20 (A) NEGATIVE mg/dL   Protein, ur NEGATIVE NEGATIVE mg/dL   Nitrite NEGATIVE NEGATIVE   Leukocytes,Ua NEGATIVE NEGATIVE    Comment: Performed at [facility], [address] [place], [place] [address]  CBC     Status: Abnormal   Collection Time: 09/27/22  9:40 PM  Result Value Ref Range   WBC 12.9 (H) 4.0 - 10.5 K/uL   RBC 4.25 3.87 - 5.11 MIL/uL   Hemoglobin 10.8 (L) 12.0 - 15.0 g/dL   HCT 57.8 (L) 46.9 - 62.9 %   MCV 80.2 80.0 - 100.0 fL   MCH 25.4 (L) 26.0 - 34.0 pg   MCHC 31.7 30.0 - 36.0 g/dL   RDW 52.8 41.3 - 24.4 %   Platelets 236 150 - 400 K/uL   nRBC 0.0 0.0 - 0.2 %    Comment: Performed at [facility], [address], [place], [place] [address]    CT ABDOMEN PELVIS W CONTRAST  Result Date: 09/27/2022 CLINICAL DATA:  Abdominal bloating, nausea, diarrhea. EXAM: CT ABDOMEN AND PELVIS WITH CONTRAST TECHNIQUE: Multidetector CT imaging of the abdomen and pelvis was performed using the standard protocol following bolus administration of intravenous contrast. RADIATION DOSE REDUCTION: This exam was performed according to the departmental dose-optimization program which includes automated exposure control, adjustment of the mA and/or  kV according to  patient size and/or use of iterative reconstruction technique. CONTRAST:  OMNIPAQUE IOHEXOL 300 MG/ML  SOLN COMPARISON:  April 06, 2022. FINDINGS: Lower chest: No acute abnormality. Hepatobiliary: No cholelithiasis or biliary dilatation is noted. Probable 2 hepatic cysts are seen in right hepatic lobe. Pancreas: Unremarkable. No pancreatic ductal dilatation or surrounding inflammatory changes. Spleen: Normal in size without focal abnormality. Adrenals/Urinary Tract: Adrenal glands are unremarkable. Kidneys are normal, without renal calculi, focal lesion, or hydronephrosis. Bladder is unremarkable. Stomach/Bowel: Stomach is unremarkable. Status post subtotal colectomy. There is severe diffuse small bowel dilatation with severe narrowing or transition zone seen in the pelvis best seen on image number 57 of series 2. It is uncertain if this is inflammatory or neoplastic in etiology. Vascular/Lymphatic: No significant vascular findings are present. No enlarged abdominal or pelvic lymph nodes. Reproductive: Continued presence of enlarged fibroid uterus. Other: No ascites or hernia is noted. Musculoskeletal: No acute or significant osseous findings. IMPRESSION: Status post subtotal colectomy. Severe diffuse small bowel dilatation is noted with severe narrowing or transition zone seen in the pelvis consistent with small bowel obstruction. It is uncertain if this is inflammatory or neoplastic in etiology. Enlarged fibroid uterus. Electronically Signed   By: Lupita Raider M.D.   On: 09/27/2022 20:36    Review of Systems  Constitutional:  Positive for fatigue.  Gastrointestinal:  Positive for abdominal distention.   Blood pressure 124/89, pulse 96, temperature 98 F (36.7 C), resp. rate (!) 21, height 5\' 4"  (1.626 m), weight 57 kg, last menstrual period 04/24/2021, SpO2 100 %. Physical Exam Abdominal:     General: There is distension.     Tenderness: There is no abdominal tenderness. There is no guarding  or rebound.     Hernia: No hernia is present.       Comments: Scar noted  Skin:    General: Skin is warm.  Neurological:     General: No focal deficit present.     Mental Status: She is alert.  Psychiatric:        Mood and Affect: Mood normal.     Assessment/Plan: Partial small bowel obstruction-she has no nausea vomiting having bowel movements.  Observe for now keep NPO.  If symptoms resolve, start diet in a.m.  This could be inflammatory or possibly neoplastic in nature.  If she vomits, recommend NG tube but otherwise observe  High complexity  Dameshia Seybold A Amarea Macdowell 09/27/2022, 10:00 PM

## 2022-09-28 ENCOUNTER — Ambulatory Visit: Payer: Managed Care, Other (non HMO)

## 2022-09-28 ENCOUNTER — Encounter: Payer: Managed Care, Other (non HMO) | Admitting: Physician Assistant

## 2022-09-28 DIAGNOSIS — K56609 Unspecified intestinal obstruction, unspecified as to partial versus complete obstruction: Secondary | ICD-10-CM | POA: Diagnosis not present

## 2022-09-28 DIAGNOSIS — K529 Noninfective gastroenteritis and colitis, unspecified: Secondary | ICD-10-CM | POA: Diagnosis not present

## 2022-09-28 LAB — C DIFFICILE QUICK SCREEN W PCR REFLEX
C Diff antigen: NEGATIVE
C Diff interpretation: NOT DETECTED
C Diff toxin: NEGATIVE

## 2022-09-28 LAB — BASIC METABOLIC PANEL
Anion gap: 9 (ref 5–15)
BUN: 11 mg/dL (ref 6–20)
CO2: 22 mmol/L (ref 22–32)
Calcium: 8.5 mg/dL — ABNORMAL LOW (ref 8.9–10.3)
Chloride: 103 mmol/L (ref 98–111)
Creatinine, Ser: 0.95 mg/dL (ref 0.44–1.00)
GFR, Estimated: >60 mL/min (ref >60)
Glucose, Bld: 78 mg/dL (ref 70–99)
Potassium: 4 mmol/L (ref 3.5–5.1)
Sodium: 134 mmol/L — ABNORMAL LOW (ref 135–145)

## 2022-09-28 LAB — GASTROINTESTINAL PANEL BY PCR, STOOL (REPLACES STOOL CULTURE)

## 2022-09-28 LAB — CBC
HCT: 32.4 % — ABNORMAL LOW (ref 36.0–46.0)
Hemoglobin: 10.4 g/dL — ABNORMAL LOW (ref 12.0–15.0)
MCH: 26.1 pg (ref 26.0–34.0)
MCHC: 32.1 g/dL (ref 30.0–36.0)
MCV: 81.2 fL (ref 80.0–100.0)
Platelets: 217 10*3/uL (ref 150–400)
RBC: 3.99 MIL/uL (ref 3.87–5.11)
RDW: 15.4 % (ref 11.5–15.5)
WBC: 8.7 10*3/uL (ref 4.0–10.5)
nRBC: 0 % (ref 0.0–0.2)

## 2022-09-28 LAB — PROTIME-INR
INR: 1 (ref 0.8–1.2)
Prothrombin Time: 13.9 seconds (ref 11.4–15.2)

## 2022-09-28 LAB — APTT: aPTT: 28 seconds (ref 24–36)

## 2022-09-28 LAB — HIV ANTIBODY (ROUTINE TESTING W REFLEX): HIV Screen 4th Generation wRfx: NONREACTIVE

## 2022-09-28 MED ORDER — KATE FARMS STANDARD 1.4 PO LIQD
325.0000 mL | Freq: Two times a day (BID) | ORAL | Status: DC
  Administered 2022-09-28 – 2022-09-29 (×3): 325 mL via ORAL
  Filled 2022-09-28 (×3): qty 325

## 2022-09-28 MED ORDER — BOOST / RESOURCE BREEZE PO LIQD CUSTOM
1.0000 | Freq: Three times a day (TID) | ORAL | Status: DC
  Administered 2022-09-28: 1 via ORAL

## 2022-09-28 MED ORDER — POTASSIUM CHLORIDE CRYS ER 20 MEQ PO TBCR
40.0000 meq | EXTENDED_RELEASE_TABLET | Freq: Once | ORAL | Status: AC
  Administered 2022-09-28: 40 meq via ORAL
  Filled 2022-09-28: qty 2

## 2022-09-28 NOTE — Progress Notes (Signed)
Initial Nutrition Assessment  DOCUMENTATION CODES:   Not applicable  INTERVENTION:  - Full Liquid diet per Surgery.  [employer] 1.4 PO BID, each supplement provides 455 kcal and 20 grams protein. - Encourage intake as tolerated.  - Monitor weight trends.   NUTRITION DIAGNOSIS:   Inadequate oral intake related to poor appetite, acute illness (SBO) as evidenced by per patient/family report, energy intake < or equal to 50% for > or equal to 5 days.  GOAL:   Patient will meet greater than or equal to 90% of their needs  MONITOR:   PO intake, Supplement acceptance, Diet advancement, Weight trends  REASON FOR ASSESSMENT:   Malnutrition Screening Tool    ASSESSMENT:   47 y.o. female with PMH of static breast cancer, gets periodic chemotherapy, and Crohn's disease s/p colectomy with ileosigmoid anastomosis (March 2021). Presented after developing persistent loose stools (2-3 every day) associated with very poor appetite. Admitted for enteritis and SBO.   Patient endorses UBW of 133# and no changes in weight until the past week, she she feels she has likely lost weight due to eating very little. Per EMR, patient has lost 6# or 4.5% body weight in the last month, which is borderline significant. Last weight prior to this admit was from May. If weight loss occurred over the past week as patient reports, this would be significant.   States she normally eats very well at home with 3-4 meals a day. Also has 1 scoop of Serious [employer] protein power once daily mixed with water. Appetite good until the past week.  Over the past week has slowly been decreasing the amount of meals she eats due to diarrhea and appetite. Notes she ate nothing the day before presenting to the hospital.   Patient reports feeling much better this AM. Advanced to a clear liquid diet this morning and endorses tolerating well. Had a Boost Breeze but didn't really like it. She is hopeful to advance to a soft diet today.  She is agreeable to try [employer] once diet advanced for a plant based option.  Surgery following patient, advanced her to a full liquid diet this afternoon.  Medications reviewed and include: -  Labs reviewed:  Na 134   NUTRITION - FOCUSED PHYSICAL EXAM:  Flowsheet Row Most Recent Value  Orbital Region No depletion  Upper Arm Region No depletion  Thoracic and Lumbar Region No depletion  Buccal Region No depletion  Temple Region No depletion  Clavicle Bone Region Mild depletion  Clavicle and Acromion Bone Region No depletion  Scapular Bone Region Unable to assess  Dorsal Hand No depletion  Patellar Region No depletion  Anterior Thigh Region No depletion  Posterior Calf Region No depletion  Edema (RD Assessment) None  Hair Reviewed  Eyes Reviewed  Mouth Reviewed  Skin Reviewed  Nails Reviewed       Diet Order:   Diet Order             Diet full liquid Fluid consistency: Thin  Diet effective now                   EDUCATION NEEDS:  Education needs have been addressed  Skin:  Skin Assessment: Reviewed RN Assessment  Last BM:  6/19  Height:  Ht Readings from Last 1 Encounters:  09/27/22 5\' 4"  (1.626 m)   Weight:  Wt Readings from Last 1 Encounters:  09/27/22 57 kg    BMI:  Body mass index is 21.57 kg/m.  Estimated Nutritional Needs:  Kcal:  1700-2000 kcals Protein:  70-80 grams Fluid:  >/= 1.7L    Shelle Iron RD, LDN For contact information, refer to [facility].

## 2022-09-28 NOTE — Plan of Care (Signed)
  Problem: Activity: Goal: Risk for activity intolerance will decrease Outcome: Progressing   Problem: Nutrition: Goal: Adequate nutrition will be maintained Outcome: Progressing   Problem: Safety: Goal: Ability to remain free from injury will improve Outcome: Progressing   Problem: Pain Managment: Goal: General experience of comfort will improve Outcome: Progressing   

## 2022-09-28 NOTE — Progress Notes (Addendum)
Subjective: CC: Patient reports on Thursday she began having decreased appetite and bloating after anytime she ate. No n/v. Was still able to pass flatus and have liquid stools.   She feels better today. No abdominal pain, n/v. Bloating better. Passing flatus. Liquid bm today. She is tolerating cld.   Objective: Vital signs in last 24 hours: Temp:  [98 F (36.7 C)-99.2 F (37.3 C)] 98.3 F (36.8 C) (06/20 0958) Pulse Rate:  [84-108] 97 (06/20 0958) Resp:  [16-21] 17 (06/20 0958) BP: (110-136)/(84-89) 118/85 (06/20 0958) SpO2:  [98 %-100 %] 99 % (06/20 0958) Weight:  [57 kg] 57 kg (06/19 1702) Last BM Date : 09/27/22  Intake/Output from previous day: 06/19 0701 - 06/20 0700 In: 1787.5 [P.O.:120; I.V.:768.3; IV Piggyback:899.2] Out: 1925 [Urine:1125; Stool:800] Intake/Output this shift: No intake/output data recorded.  PE: Gen:  Alert, NAD, pleasant Abd: At least mild distension but very soft and completely NT. +BS  Lab Results:  Recent Labs    09/27/22 2140 09/28/22 0350  WBC 12.9* 8.7  HGB 10.8* 10.4*  HCT 34.1* 32.4*  PLT 236 217   BMET Recent Labs    09/27/22 1849 09/27/22 2140 09/28/22 0350  NA 133*  --  134*  K 3.1*  --  4.0  CL 97*  --  103  CO2 24  --  22  GLUCOSE 81  --  78  BUN 15  --  11  CREATININE 0.98 1.11* 0.95  CALCIUM 9.0  --  8.5*   PT/INR Recent Labs    09/28/22 0350  LABPROT 13.9  INR 1.0   CMP     Component Value Date/Time   NA 134 (L) 09/28/2022 0350   NA 135 01/21/2020 1056   K 4.0 09/28/2022 0350   CL 103 09/28/2022 0350   CO2 22 09/28/2022 0350   GLUCOSE 78 09/28/2022 0350   BUN 11 09/28/2022 0350   BUN 12 01/21/2020 1056   CREATININE 0.95 09/28/2022 0350   CREATININE 0.91 09/21/2022 1141   CALCIUM 8.5 (L) 09/28/2022 0350   PROT 8.1 09/27/2022 1849   ALBUMIN 3.7 09/27/2022 1849   AST 27 09/27/2022 1849   AST 18 09/21/2022 1141   ALT 15 09/27/2022 1849   ALT 12 09/21/2022 1141   ALKPHOS 136 (H)  09/27/2022 1849   BILITOT 0.9 09/27/2022 1849   BILITOT 0.4 09/21/2022 1141   GFRNONAA >60 09/28/2022 0350   GFRNONAA >60 09/21/2022 1141   GFRAA 108 01/21/2020 1056   Lipase     Component Value Date/Time   LIPASE 28 09/27/2022 1849    Studies/Results: CT ABDOMEN PELVIS W CONTRAST  Result Date: 09/27/2022 CLINICAL DATA:  Abdominal bloating, nausea, diarrhea. EXAM: CT ABDOMEN AND PELVIS WITH CONTRAST TECHNIQUE: Multidetector CT imaging of the abdomen and pelvis was performed using the standard protocol following bolus administration of intravenous contrast. RADIATION DOSE REDUCTION: This exam was performed according to the departmental dose-optimization program which includes automated exposure control, adjustment of the mA and/or kV according to patient size and/or use of iterative reconstruction technique. CONTRAST:  OMNIPAQUE IOHEXOL 300 MG/ML  SOLN COMPARISON:  April 06, 2022. FINDINGS: Lower chest: No acute abnormality. Hepatobiliary: No cholelithiasis or biliary dilatation is noted. Probable 2 hepatic cysts are seen in right hepatic lobe. Pancreas: Unremarkable. No pancreatic ductal dilatation or surrounding inflammatory changes. Spleen: Normal in size without focal abnormality. Adrenals/Urinary Tract: Adrenal glands are unremarkable. Kidneys are normal, without renal calculi, focal lesion, or hydronephrosis. Bladder  is unremarkable. Stomach/Bowel: Stomach is unremarkable. Status post subtotal colectomy. There is severe diffuse small bowel dilatation with severe narrowing or transition zone seen in the pelvis best seen on image number 57 of series 2. It is uncertain if this is inflammatory or neoplastic in etiology. Vascular/Lymphatic: No significant vascular findings are present. No enlarged abdominal or pelvic lymph nodes. Reproductive: Continued presence of enlarged fibroid uterus. Other: No ascites or hernia is noted. Musculoskeletal: No acute or significant osseous findings.  IMPRESSION: Status post subtotal colectomy. Severe diffuse small bowel dilatation is noted with severe narrowing or transition zone seen in the pelvis consistent with small bowel obstruction. It is uncertain if this is inflammatory or neoplastic in etiology. Enlarged fibroid uterus. Electronically Signed   By: Lupita Raider M.D.   On: 09/27/2022 20:36    Anti-infectives: Anti-infectives (From admission, onward)    Start     Dose/Rate Route Frequency Ordered Stop   09/27/22 2200  metroNIDAZOLE (FLAGYL) IVPB 500 mg        500 mg 100 mL/hr over 60 Minutes Intravenous Every 12 hours 09/27/22 2111     09/27/22 2115  cefTRIAXone (ROCEPHIN) 2 g in sodium chloride 0.9 % 100 mL IVPB        2 g 200 mL/hr over 30 Minutes Intravenous Every 24 hours 09/27/22 2111          Assessment/Plan SBO - CT w/ small bowel dilation with narrowing vs transition in the pelvis. Patient has hx of Crohn's s/p s/p subtotal colectomy with ileo-sigmoid anastomosis by Dr. Maisie Fus in 2021. Patient had 4 admissions last year for SBO. During her last admission in May 2023 she had a flex ex that showed moderate stenosis at the side-to-side ileo-colonic anastomosis in the sigmoid colon but was able to be transversed. There was also evidence of active crohn's on Bx. She now is off tx for her Crohn's as she has recurrence of her breast cancer and is on chemo - HDS without fever, tachycardia (resolved) or hypotension. No peritonitis on exam. WBC 8.7 (near baseline based on prior labs over the last few months). No current indication for emergency surgery - C. Diff and GI panel negative - Unclear etiology of her SBO. She has several reasons why she could have an SBO considering her hx above. Will discuss with MD if she warrants GI consultation inpatient for further w/u (scope) or needs close f/u as outpatient.  - Okay with moving to FLD today - We will follow with you   FEN - FLD, IVF per TR[facility] VTE - SCDs, okay for chem ppx from our  standpoint ID - Currently has Rocephin/Flagyl ordered. She does not need any antibiotics from our standpoint  Hx of Crohn's s/p robotic assisted subtotal colectomy with ileo-sigmoid anastomosis 06/27/19 by Dr. Maisie Fus. Followed by [facility]. Most recent scope was a flex sig on 09/07/21 that showed prior side-to-side ileo-colonic anastomosis in the sigmoid colon, ~25 cm from the anal verge with edema, erythema, ulcers and moderate stenosis (able to pass with scope and advance to 10cm of the distal ileum). Biopsies were taken and the rectum bx demonstrated active chronic colitis. Negative for dysplasia. There was recommendations for patient to f/u with Dr. Maisie Fus in the office to discuss possible redo of her anastomosis but it does not appear she has f/u'd with her since last SBO. She was previously on mesalamine for Crohn's. 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 Dwain Sarna in 2022. Recurred in 2023 and she is now on systemic chemo followed by Dr. Pamelia Hoit. 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   LOS: 0 days    Jacinto Halim , [facility] 09/28/2022, 11:34 AM Please see Amion for pager number during day hours 7:00am-4:30pm

## 2022-09-28 NOTE — Plan of Care (Signed)

## 2022-09-28 NOTE — TOC CM/SW Note (Signed)
Transition of Care [facility]) - Inpatient Brief Assessment  Patient Details  Name: [name] MRN: [id number] Date of Birth: 1975-08-27  Transition of [facility]) CM/SW Contact:    Ewing Schlein, LCSW Phone Number: 09/28/2022, 1:33 PM  Clinical Narrative: Screening completed. Per chart review, patient does not have a PCP, but she declined to have CSW set up a hospital follow up appointment as she would like to read reviews prior to choosing a new PCP. No TOC needs identified.  Transition of Care Asessment: Insurance and Status: Insurance coverage has been reviewed Patient has primary care physician: No (Patient declined to have TOC set her up at a [facility] clinic and would like to find her own PCP.) Home environment has been reviewed: Resides with significant other Prior level of function:: Independent at baseline Prior/Current Home Services: No current home services Social Determinants of Health Reivew: SDOH reviewed no interventions necessary Readmission risk has been reviewed: Yes Transition of care needs: no transition of care needs at this time

## 2022-09-28 NOTE — Progress Notes (Signed)
PROGRESS NOTE  [name]  DOB: 1975-11-15  PCP: Patient, [name] ZOX:[id number]  DOA: 09/27/2022  LOS: 0 days  Hospital Day: 2  Brief narrative: [name] is a 47 y.o. 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 6/19, patient presented to the ED with complaint of few days of loose stool, poor appetite. Most recent chemotherapy was on Thursday 6/13.  Since then, she has been having persistent 2-3 bowel movements a day, not responding to Imodium as an outpatient.  Not able to hold any food or water down.  No fever.  Progressively weak.  In the ED, no fever, heart rate 108, blood pressure normal WBC count at 12.9, hemoglobin 10.8 CT abdomen pelvis showed severe diffuse small bowel dilatation with severe narrowing or transition zone in the pelvis consistent with small bowel obstruction C. difficile antigen, toxin negative Admitted to [facility] consulted  Subjective: Patient was seen and examined this morning.  Pleasant young African-American female. Continues to have diarrhea but feels bloated as well.  Has distended abdomen. Chart reviewed Remains hemodynamically stable WBC count normal at 8.7, hemoglobin 10.4  Assessment and plan: Partial small bowel obstruction Acute gastroenteritis Patient gives a history of abdominal pain, diarrhea started since last chemotherapy 6/13. However CT abdomen suggests severe bowel dilatation and small bowel obstruction. Had her bowel movement this morning just an hour before my evaluation. General surgery following Currently on observation with n.p.o. status, IV fluid, IV antiemetics Since patient is not throwing up, she has not required NG tube. CT assay negative. Initially  started on IV antibiotics but patient refused.  I do not see any infectious etiology at this time.  Mild leukocytosis Likely reactive due to stress.  Improved. Recent Labs  Lab 09/27/22 1849 09/27/22 2140 09/28/22 0350  WBC 11.2* 12.9* 8.7   Chronic anemia Hemoglobin chronically low but stable above 10. Continue to monitor Recent Labs    10/25/21 1541 12/26/21 0807 12/27/21 1029 05/17/22 0940 09/01/22 1301 09/21/22 1141 09/27/22 1849 09/27/22 2140 09/28/22 0350  HGB  --    < >  --    < > 10.9* 10.9* 11.5* 10.8* 10.4*  MCV  --    < >  --    < > 81.0 80.3 80.7 80.2 81.2  VITAMINB12 665  --   --   --   --   --   --   --   --   FERRITIN  --   --  50.7  --   --   --   --   --   --   TIBC  --   --  246.4*  --   --   --   --   --   --   IRON  --   --  65  --   --   --   --   --   --    < > = values in this interval not displayed.   Mobility: Encourage ambulation  Goals of care   Code Status: Full Code     DVT prophylaxis:  enoxaparin (LOVENOX) injection 40 mg Start: 09/28/22 0800 SCDs Start: 09/27/22 2123   Antimicrobials: None currently Fluid: LR at 100 mill per hour Consultants: Surgery Family Communication: None at bedside  Status: Observation Level of care:  Med-Surg  Patient from: Home Anticipated d/c to: Pending clinical course Needs to continue in-hospital care:  Remains symptomatic       Diet:  Diet Order             Diet full liquid Fluid consistency: Thin  Diet effective now                   Scheduled Meds:  enoxaparin (LOVENOX) injection  40 mg Subcutaneous Q24H   feeding supplement ([employer] STANDARD 1.4)  325 mL Oral BID BM   sodium chloride flush  3 mL Intravenous Q12H    PRN meds: acetaminophen **OR** acetaminophen   Infusions:   cefTRIAXone (ROCEPHIN)  IV Stopped (09/27/22 2225)   lactated ringers 100 mL/hr at 09/27/22 2219   metronidazole      Antimicrobials: Anti-infectives (From admission, onward)    Start      Dose/Rate Route Frequency Ordered Stop   09/27/22 2200  metroNIDAZOLE (FLAGYL) IVPB 500 mg        500 mg 100 mL/hr over 60 Minutes Intravenous Every 12 hours 09/27/22 2111     09/27/22 2115  cefTRIAXone (ROCEPHIN) 2 g in sodium chloride 0.9 % 100 mL IVPB        2 g 200 mL/hr over 30 Minutes Intravenous Every 24 hours 09/27/22 2111         Nutritional status:  Body mass index is 21.57 kg/m.  Nutrition Problem: Inadequate oral intake Etiology: poor appetite, acute illness (SBO) Signs/Symptoms: per patient/family report, energy intake < or equal to 50% for > or equal to 5 days     Objective: Vitals:   09/28/22 0620 09/28/22 0958  BP: 116/84 118/85  Pulse: 88 97  Resp: 16 17  Temp: 98.2 F (36.8 C) 98.3 F (36.8 C)  SpO2: 98% 99%    Intake/Output Summary (Last 24 hours) at 09/28/2022 1336 Last data filed at 09/28/2022 0618 Gross per 24 hour  Intake 1787.53 ml  Output 1925 ml  Net -137.47 ml   Filed Weights   09/27/22 1702  Weight: 57 kg   Weight change:  Body mass index is 21.57 kg/m.   Physical Exam: General exam: Pleasant, young African-American female Skin: No rashes, lesions or ulcers. HEENT: Atraumatic, normocephalic, no obvious bleeding Lungs: Clear to auscultation bilaterally CVS: Regular rate and rhythm, no murmur GI/Abd soft, distended, tympanitic, bowel sound present CNS: Alert, awake, oriented x 3 Psychiatry: Mood appropriate Extremities: No edema, no calf redness  Data Review: I have personally reviewed the laboratory data and studies available.  F/u labs ordered Unresulted Labs (From admission, onward)     Start     Ordered   10/04/22 0500  Creatinine, serum  (enoxaparin (LOVENOX)    CrCl >/= 30 ml/min)  Weekly,   R     Comments: while on enoxaparin therapy    09/27/22 2122   09/29/22 0500  CBC with Differential/Platelet  Tomorrow morning,   R        09/28/22 0941   09/29/22 0500  Basic metabolic panel  Tomorrow morning,   R         09/28/22 0941            Total time spent in review of labs and imaging, patient evaluation, formulation of plan, documentation and communication with family: 55 minutes  Signed, Lorin Glass, MD [facility] 09/28/2022

## 2022-09-29 DIAGNOSIS — K50819 Crohn's disease of both small and large intestine with unspecified complications: Secondary | ICD-10-CM

## 2022-09-29 DIAGNOSIS — K56609 Unspecified intestinal obstruction, unspecified as to partial versus complete obstruction: Secondary | ICD-10-CM

## 2022-09-29 DIAGNOSIS — C50411 Malignant neoplasm of upper-outer quadrant of right female breast: Secondary | ICD-10-CM

## 2022-09-29 DIAGNOSIS — Z170 Estrogen receptor positive status [ER+]: Secondary | ICD-10-CM

## 2022-09-29 DIAGNOSIS — K529 Noninfective gastroenteritis and colitis, unspecified: Secondary | ICD-10-CM | POA: Diagnosis not present

## 2022-09-29 LAB — CBC WITH DIFFERENTIAL/PLATELET
Abs Immature Granulocytes: 0.02 10*3/uL (ref 0.00–0.07)
Basophils Absolute: 0 10*3/uL (ref 0.0–0.1)
Basophils Relative: 0 %
Eosinophils Absolute: 0 10*3/uL (ref 0.0–0.5)
Eosinophils Relative: 0 %
HCT: 32 % — ABNORMAL LOW (ref 36.0–46.0)
Hemoglobin: 10.1 g/dL — ABNORMAL LOW (ref 12.0–15.0)
Immature Granulocytes: 0 %
Lymphocytes Relative: 17 %
Lymphs Abs: 1.3 10*3/uL (ref 0.7–4.0)
MCH: 25.3 pg — ABNORMAL LOW (ref 26.0–34.0)
MCHC: 31.6 g/dL (ref 30.0–36.0)
MCV: 80.2 fL (ref 80.0–100.0)
Monocytes Absolute: 0.9 10*3/uL (ref 0.1–1.0)
Monocytes Relative: 12 %
Neutro Abs: 5.4 10*3/uL (ref 1.7–7.7)
Neutrophils Relative %: 71 %
Platelets: 211 10*3/uL (ref 150–400)
RBC: 3.99 MIL/uL (ref 3.87–5.11)
RDW: 15.1 % (ref 11.5–15.5)
WBC: 7.7 10*3/uL (ref 4.0–10.5)
nRBC: 0 % (ref 0.0–0.2)

## 2022-09-29 LAB — BASIC METABOLIC PANEL
Anion gap: 9 (ref 5–15)
BUN: 9 mg/dL (ref 6–20)
CO2: 27 mmol/L (ref 22–32)
Calcium: 8.7 mg/dL — ABNORMAL LOW (ref 8.9–10.3)
Chloride: 100 mmol/L (ref 98–111)
Creatinine, Ser: 0.86 mg/dL (ref 0.44–1.00)
GFR, Estimated: >60 mL/min (ref >60)
Glucose, Bld: 112 mg/dL — ABNORMAL HIGH (ref 70–99)
Potassium: 3.2 mmol/L — ABNORMAL LOW (ref 3.5–5.1)
Sodium: 136 mmol/L (ref 135–145)

## 2022-09-29 MED ORDER — ALUM & MAG HYDROXIDE-SIMETH 200-200-20 MG/5ML PO SUSP
15.0000 mL | Freq: Four times a day (QID) | ORAL | Status: DC | PRN
  Filled 2022-09-29: qty 30

## 2022-09-29 MED ORDER — POTASSIUM CHLORIDE CRYS ER 20 MEQ PO TBCR
40.0000 meq | EXTENDED_RELEASE_TABLET | Freq: Once | ORAL | Status: AC
  Administered 2022-09-29: 40 meq via ORAL
  Filled 2022-09-29: qty 2

## 2022-09-29 NOTE — Progress Notes (Signed)
Subjective: CC: Feeling much better. Still a little bloated but tolerating fld without any abdominal pain, n/v. Passing flatus. 3-4 bm's yesterday that are bulking up. Eating most of her trays.   Afebrile. No tachycardia or hypotension. WBC 7.7.   Objective: Vital signs in last 24 hours: Temp:  [98.3 F (36.8 C)-98.6 F (37 C)] 98.5 F (36.9 C) (06/21 0510) Pulse Rate:  [87-100] 87 (06/21 0510) Resp:  [17-18] 18 (06/21 0510) BP: (113-134)/(76-93) 113/78 (06/21 0510) SpO2:  [98 %-99 %] 99 % (06/21 0510) Last BM Date : 09/28/22  Intake/Output from previous day: 06/20 0701 - 06/21 0700 In: 1858 [P.O.:665; I.V.:1193] Out: 800 [Urine:800] Intake/Output this shift: No intake/output data recorded.  PE: Gen:  Alert, NAD, pleasant Abd: Much less distended, very soft, completely NT. +BS  Lab Results:  Recent Labs    09/28/22 0350 09/29/22 0341  WBC 8.7 7.7  HGB 10.4* 10.1*  HCT 32.4* 32.0*  PLT 217 211    BMET Recent Labs    09/28/22 0350 09/29/22 0341  NA 134* 136  K 4.0 3.2*  CL 103 100  CO2 22 27  GLUCOSE 78 112*  BUN 11 9  CREATININE 0.95 0.86  CALCIUM 8.5* 8.7*    PT/INR Recent Labs    09/28/22 0350  LABPROT 13.9  INR 1.0    CMP     Component Value Date/Time   NA 136 09/29/2022 0341   NA 135 01/21/2020 1056   K 3.2 (L) 09/29/2022 0341   CL 100 09/29/2022 0341   CO2 27 09/29/2022 0341   GLUCOSE 112 (H) 09/29/2022 0341   BUN 9 09/29/2022 0341   BUN 12 01/21/2020 1056   CREATININE 0.86 09/29/2022 0341   CREATININE 0.91 09/21/2022 1141   CALCIUM 8.7 (L) 09/29/2022 0341   PROT 8.1 09/27/2022 1849   ALBUMIN 3.7 09/27/2022 1849   AST 27 09/27/2022 1849   AST 18 09/21/2022 1141   ALT 15 09/27/2022 1849   ALT 12 09/21/2022 1141   ALKPHOS 136 (H) 09/27/2022 1849   BILITOT 0.9 09/27/2022 1849   BILITOT 0.4 09/21/2022 1141   GFRNONAA >60 09/29/2022 0341   GFRNONAA >60 09/21/2022 1141   GFRAA 108 01/21/2020 1056   Lipase      Component Value Date/Time   LIPASE 28 09/27/2022 1849    Studies/Results: CT ABDOMEN PELVIS W CONTRAST  Result Date: 09/27/2022 CLINICAL DATA:  Abdominal bloating, nausea, diarrhea. EXAM: CT ABDOMEN AND PELVIS WITH CONTRAST TECHNIQUE: Multidetector CT imaging of the abdomen and pelvis was performed using the standard protocol following bolus administration of intravenous contrast. RADIATION DOSE REDUCTION: This exam was performed according to the departmental dose-optimization program which includes automated exposure control, adjustment of the mA and/or kV according to patient size and/or use of iterative reconstruction technique. CONTRAST:  OMNIPAQUE IOHEXOL 300 MG/ML  SOLN COMPARISON:  April 06, 2022. FINDINGS: Lower chest: No acute abnormality. Hepatobiliary: No cholelithiasis or biliary dilatation is noted. Probable 2 hepatic cysts are seen in right hepatic lobe. Pancreas: Unremarkable. No pancreatic ductal dilatation or surrounding inflammatory changes. Spleen: Normal in size without focal abnormality. Adrenals/Urinary Tract: Adrenal glands are unremarkable. Kidneys are normal, without renal calculi, focal lesion, or hydronephrosis. Bladder is unremarkable. Stomach/Bowel: Stomach is unremarkable. Status post subtotal colectomy. There is severe diffuse small bowel dilatation with severe narrowing or transition zone seen in the pelvis best seen on image number 57 of series 2. It is uncertain if this is inflammatory or  neoplastic in etiology. Vascular/Lymphatic: No significant vascular findings are present. No enlarged abdominal or pelvic lymph nodes. Reproductive: Continued presence of enlarged fibroid uterus. Other: No ascites or hernia is noted. Musculoskeletal: No acute or significant osseous findings. IMPRESSION: Status post subtotal colectomy. Severe diffuse small bowel dilatation is noted with severe narrowing or transition zone seen in the pelvis consistent with small bowel obstruction.  It is uncertain if this is inflammatory or neoplastic in etiology. Enlarged fibroid uterus. Electronically Signed   By: Lupita Raider M.D.   On: 09/27/2022 20:36    Anti-infectives: Anti-infectives (From admission, onward)    Start     Dose/Rate Route Frequency Ordered Stop   09/27/22 2200  metroNIDAZOLE (FLAGYL) IVPB 500 mg        500 mg 100 mL/hr over 60 Minutes Intravenous Every 12 hours 09/27/22 2111     09/27/22 2115  cefTRIAXone (ROCEPHIN) 2 g in sodium chloride 0.9 % 100 mL IVPB        2 g 200 mL/hr over 30 Minutes Intravenous Every 24 hours 09/27/22 2111          Assessment/Plan SBO - CT w/ small bowel dilation with narrowing vs transition in the pelvis. Patient has hx of Crohn's s/p s/p subtotal colectomy with ileo-sigmoid anastomosis by Dr. Maisie Fus in 2021. Patient had 4 admissions last year for SBO. During her last admission in May 2023 she had a flex ex that showed moderate stenosis at the side-to-side ileo-colonic anastomosis in the sigmoid colon but was able to be transversed. There was also evidence of active crohn's on Bx. She now is off tx for her Crohn's as she has recurrence of her breast cancer (w/ mets to rib) and is on chemo - HDS without fever, tachycardia or hypotension. No peritonitis on exam. WBC 7.7 (near baseline based on prior labs over the last few months). Patient clinically improving. Tolerating FLD without abdominal pain, n/v and is having bowel function. No current indication for emergency surgery - C. Diff and GI panel negative - Unclear etiology of her SBO. She has several reasons why she could have an SBO considering her hx above. I discussed with GI about scoping here to further w/u this. They will arrange close output f/u for scope.  - Adv to soft diet. If tolerates, okay for d/c from our standpoint  FEN - Soft diet, IVF per [facility] VTE - SCDs, okay for chem ppx from our standpoint ID - Currently has Rocephin/Flagyl ordered. She does not need any  antibiotics from our standpoint  Hx of Crohn's s/p robotic assisted subtotal colectomy with ileo-sigmoid anastomosis 06/27/19 by Dr. Maisie Fus. Followed by [facility]. Most recent scope was a flex sig on 09/07/21 that showed prior side-to-side ileo-colonic anastomosis in the sigmoid colon, ~25 cm from the anal verge with edema, erythema, ulcers and moderate stenosis (able to pass with scope and advance to 10cm of the distal ileum). Biopsies were taken and the rectum bx demonstrated active chronic colitis. Negative for dysplasia. There was recommendations for patient to f/u with Dr. Maisie Fus in the office to discuss possible redo of her anastomosis but it does not appear she has f/u'd with her since last SBO. She was previously on mesalamine for Crohn's. 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 Dwain Sarna in 2022. Recurred in 2023 and she is now on systemic  chemo followed by Dr. Pamelia Hoit. 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   LOS: 0 days    Jacinto Halim , [facility] Surgery 09/29/2022, 8:42 AM Please see Amion for pager number during day hours 7:00am-4:30pm

## 2022-09-29 NOTE — Plan of Care (Signed)
?  Problem: Coping: ?Goal: Level of anxiety will decrease ?Outcome: Progressing ?  ?Problem: Safety: ?Goal: Ability to remain free from injury will improve ?Outcome: Progressing ?  ?

## 2022-09-29 NOTE — Discharge Summary (Signed)
Physician Discharge Summary   Patient: [name] MRN: [id number] DOB: Apr 10, 1976  Admit date:     09/27/2022  Discharge date: 09/29/22  Discharge Physician: Brendia Sacks   PCP: Patient, [name]   Recommendations at discharge:   Follow-up with gastroenterology, see below  Discharge Diagnoses: Principal Problem:   Enteritis Active Problems:   SBO (small bowel obstruction) (HCC)   Crohn's disease of small and large intestines with complication (HCC)   Malignant neoplasm of upper-outer quadrant of right breast in female, estrogen receptor positive (HCC)  Resolved Problems:   * No resolved hospital problems. *  Hospital Course: 47 year old woman PMH metastatic breast cancer, Crohn's disease, presented with poor appetite loose stools.  Imaging suggested small bowel obstruction.  Admitted for further evaluation.  Seen by general surgery.  Diet advanced.  Obstruction spontaneously resolved.  Surgery discussed with GI who will follow-up in the outpatient setting given the unclear etiology of her SBO and abnormal flex sigmoidoscopy in May 2023 showing moderate stenosis at the side-to-side ileocolonic anastomosis.  Consultants General surgery  Procedures None  Partial small bowel obstruction, acute gastroenteritis spontaneously resolved.  Metastatic breast cancer.  Mild normocytic anemia.     Disposition: Home Diet recommendation:  Soft diet DISCHARGE MEDICATION: Allergies as of 09/29/2022       Reactions   Nsaids Other (See Comments)   Crohns disease/ IBD   Other    BLOOD PRODUCT REFUSAL        Medication List     STOP taking these medications    lidocaine-prilocaine cream Commonly known as: EMLA       TAKE these medications    acetaminophen 500 MG tablet Commonly known as: TYLENOL Take 500-1,000 mg by mouth every 6 (six) hours as needed for mild pain.   multivitamin with minerals tablet Take 1 tablet by mouth daily.   potassium chloride SA  20 MEQ tablet Commonly known as: KLOR-CON M Take 1 tablet (20 mEq total) by mouth daily.        Follow-up Information     Iva Boop, MD Follow up.   Specialty: Gastroenterology Why: Office will contact you with an appointment Contact information: [address] [place] [address] [phone]                Feels better Tolerating diet  Discharge Exam: Filed Weights   09/27/22 1702  Weight: 57 kg   Physical Exam Vitals reviewed.  Constitutional:      General: She is not in acute distress.    Appearance: She is not ill-appearing or toxic-appearing.  Cardiovascular:     Rate and Rhythm: Normal rate and regular rhythm.     Heart sounds: No murmur heard. Pulmonary:     Effort: Pulmonary effort is normal. No respiratory distress.     Breath sounds: No wheezing, rhonchi or rales.  Neurological:     Mental Status: She is alert.  Psychiatric:        Mood and Affect: Mood normal.        Behavior: Behavior normal.      Condition at discharge: good  The results of significant diagnostics from this hospitalization (including imaging, microbiology, ancillary and laboratory) are listed below for reference.   Imaging Studies: CT ABDOMEN PELVIS W CONTRAST  Result Date: 09/27/2022 CLINICAL DATA:  Abdominal bloating, nausea, diarrhea. EXAM: CT ABDOMEN AND PELVIS WITH CONTRAST TECHNIQUE: Multidetector CT imaging of the abdomen and pelvis was performed using the standard protocol following bolus  administration of intravenous contrast. RADIATION DOSE REDUCTION: This exam was performed according to the departmental dose-optimization program which includes automated exposure control, adjustment of the mA and/or kV according to patient size and/or use of iterative reconstruction technique. CONTRAST:  OMNIPAQUE IOHEXOL 300 MG/ML  SOLN COMPARISON:  April 06, 2022. FINDINGS: Lower chest: No acute abnormality. Hepatobiliary: No cholelithiasis or biliary dilatation  is noted. Probable 2 hepatic cysts are seen in right hepatic lobe. Pancreas: Unremarkable. No pancreatic ductal dilatation or surrounding inflammatory changes. Spleen: Normal in size without focal abnormality. Adrenals/Urinary Tract: Adrenal glands are unremarkable. Kidneys are normal, without renal calculi, focal lesion, or hydronephrosis. Bladder is unremarkable. Stomach/Bowel: Stomach is unremarkable. Status post subtotal colectomy. There is severe diffuse small bowel dilatation with severe narrowing or transition zone seen in the pelvis best seen on image number 57 of series 2. It is uncertain if this is inflammatory or neoplastic in etiology. Vascular/Lymphatic: No significant vascular findings are present. No enlarged abdominal or pelvic lymph nodes. Reproductive: Continued presence of enlarged fibroid uterus. Other: No ascites or hernia is noted. Musculoskeletal: No acute or significant osseous findings. IMPRESSION: Status post subtotal colectomy. Severe diffuse small bowel dilatation is noted with severe narrowing or transition zone seen in the pelvis consistent with small bowel obstruction. It is uncertain if this is inflammatory or neoplastic in etiology. Enlarged fibroid uterus. Electronically Signed   By: Lupita Raider M.D.   On: 09/27/2022 20:36   ECHOCARDIOGRAM COMPLETE  Result Date: 09/18/2022    ECHOCARDIOGRAM REPORT   Patient Name:   [name] Date of Exam: 09/18/2022 Medical Rec #:  [id number]        Height:       64.0 in Accession #:    [id number]       Weight:       131.0 lb Date of Birth:  1975-07-18        BSA:          1.634 m Patient Age:    46 years         BP:           122/85 mmHg Patient Gender: F                HR:           104 bpm. Exam Location:  Outpatient Procedure: 2D Echo, 3D Echo, Color Doppler, Cardiac Doppler and Strain Analysis Indications:    Chemo  History:        Patient has prior history of Echocardiogram examinations, most                 recent 05/24/2022. [place].   Sonographer:    [name] Referring Phys: Merula.Booze LINDSEY CORNETTO CAUSEY  Sonographer Comments: Image acquisition challenging due to patient body habitus, Image acquisition challenging due to respiratory motion and Image acquisition challenging due to breast implants. Global longitudinal strain was attempted. IMPRESSIONS  1. Left ventricular ejection fraction, by estimation, is 55 to 60%. The left ventricle has normal function. The left ventricle has no regional wall motion abnormalities. Left ventricular diastolic parameters are consistent with Grade I diastolic dysfunction (impaired relaxation).  2. Right ventricular systolic function was not well visualized. The right ventricular size is normal. Tricuspid regurgitation signal is inadequate for assessing PA pressure.  3. The mitral valve is normal in structure. No evidence of mitral valve regurgitation. No evidence of mitral stenosis.  4. The aortic valve is normal in structure. Aortic valve regurgitation  is not visualized. No aortic stenosis is present.  5. The inferior vena cava is normal in size with greater than 50% respiratory variability, suggesting right atrial pressure of 3 mmHg.  6. Recommend limited study with definity contrast and repeat LV strain FINDINGS  Left Ventricle: Left ventricular ejection fraction, by estimation, is 55 to 60%. The left ventricle has normal function. The left ventricle has no regional wall motion abnormalities. Global longitudinal strain performed but not reported based on interpreter judgement due to suboptimal tracking. The left ventricular internal cavity size was normal in size. There is no left ventricular hypertrophy. Left ventricular diastolic parameters are consistent with Grade I diastolic dysfunction (impaired relaxation). Normal left ventricular filling pressure. Right Ventricle: The right ventricular size is normal. No increase in right ventricular wall thickness. Right ventricular systolic function was not well  visualized. Tricuspid regurgitation signal is inadequate for assessing PA pressure. Left Atrium: Left atrial size was normal in size. Right Atrium: Right atrial size was normal in size. Pericardium: There is no evidence of pericardial effusion. Mitral Valve: The mitral valve is normal in structure. No evidence of mitral valve regurgitation. No evidence of mitral valve stenosis. Tricuspid Valve: The tricuspid valve is normal in structure. Tricuspid valve regurgitation is trivial. No evidence of tricuspid stenosis. Aortic Valve: The aortic valve is normal in structure. Aortic valve regurgitation is not visualized. No aortic stenosis is present. Pulmonic Valve: The pulmonic valve was normal in structure. Pulmonic valve regurgitation is not visualized. No evidence of pulmonic stenosis. Aorta: The aortic root is normal in size and structure. Venous: The inferior vena cava is normal in size with greater than 50% respiratory variability, suggesting right atrial pressure of 3 mmHg. IAS/Shunts: No atrial level shunt detected by color flow Doppler. Armanda Magic MD Electronically signed by Armanda Magic MD Signature Date/Time: 09/18/2022/11:56:04 AM    Final     Microbiology: Results for orders placed or performed during the hospital encounter of 09/27/22  SARS Coronavirus 2 by RT PCR (hospital order, performed in [facility] hospital lab) *cepheid single result test* Anterior Nasal Swab     Status: None   Collection Time: 09/27/22 10:22 PM   Specimen: Anterior Nasal Swab  Result Value Ref Range Status   SARS Coronavirus 2 by RT PCR NEGATIVE NEGATIVE Final    Comment: (NOTE) SARS-CoV-2 target nucleic acids are NOT DETECTED.  The SARS-CoV-2 RNA is generally detectable in upper and lower respiratory specimens during the acute phase of infection. The lowest concentration of SARS-CoV-2 viral copies this assay can detect is 250 copies / mL. A negative result does not preclude SARS-CoV-2 infection and should not be  used as the sole basis for treatment or other patient management decisions.  A negative result may occur with improper specimen collection / handling, submission of specimen other than nasopharyngeal swab, presence of viral mutation(s) within the areas targeted by this assay, and inadequate number of viral copies (<250 copies / mL). A negative result must be combined with clinical observations, patient history, and epidemiological information.  Fact Sheet for Patients:   RoadLapTop.co.za  Fact Sheet for Healthcare Providers: http://kim-miller.com/  This test is not yet approved or  cleared by the [place] FDA and has been authorized for detection and/or diagnosis of SARS-CoV-2 by FDA under an Emergency Use Authorization (EUA).  This EUA will remain in effect (meaning this test can be used) for the duration of the COVID-19 declaration under Section 564(b)(1) of the Act, 21 U.S.C. section 360bbb-3(b)(1), unless the  authorization is terminated or revoked sooner.  Performed at [facility], [address], [place], [place] [address]   Gastrointestinal Panel by PCR , Stool     Status: None   Collection Time: 09/28/22  1:09 AM   Specimen: Rectum; Stool  Result Value Ref Range Status   Campylobacter species NOT DETECTED NOT DETECTED Final   Plesimonas shigelloides NOT DETECTED NOT DETECTED Final   Salmonella species NOT DETECTED NOT DETECTED Final   Yersinia enterocolitica NOT DETECTED NOT DETECTED Final   Vibrio species NOT DETECTED NOT DETECTED Final   Vibrio cholerae NOT DETECTED NOT DETECTED Final   Enteroaggregative E coli (EAEC) NOT DETECTED NOT DETECTED Final   Enteropathogenic E coli (EPEC) NOT DETECTED NOT DETECTED Final   Enterotoxigenic E coli (ETEC) NOT DETECTED NOT DETECTED Final   Shiga like toxin producing E coli (STEC) NOT DETECTED NOT DETECTED Final   Shigella/Enteroinvasive E coli (EIEC) NOT DETECTED  NOT DETECTED Final   Cryptosporidium NOT DETECTED NOT DETECTED Final   Cyclospora cayetanensis NOT DETECTED NOT DETECTED Final   Entamoeba histolytica NOT DETECTED NOT DETECTED Final   Giardia lamblia NOT DETECTED NOT DETECTED Final   Adenovirus F40/41 NOT DETECTED NOT DETECTED Final   Astrovirus NOT DETECTED NOT DETECTED Final   Norovirus GI/GII NOT DETECTED NOT DETECTED Final   Rotavirus A NOT DETECTED NOT DETECTED Final   Sapovirus (I, II, IV, and V) NOT DETECTED NOT DETECTED Final    Comment: Performed at [facility], [address] [place], [place] [address]  C Difficile Quick Screen w PCR reflex     Status: None   Collection Time: 09/28/22  1:09 AM  Result Value Ref Range Status   C Diff antigen NEGATIVE NEGATIVE Final   C Diff toxin NEGATIVE NEGATIVE Final   C Diff interpretation No C. difficile detected.  Final    Comment: Performed at [facility], [address], [place], [place] [address]    Labs: CBC: Recent Labs  Lab 09/27/22 1849 09/27/22 2140 09/28/22 0350 09/29/22 0341  WBC 11.2* 12.9* 8.7 7.7  NEUTROABS 8.5*  --   --  5.4  HGB 11.5* 10.8* 10.4* 10.1*  HCT 36.3 34.1* 32.4* 32.0*  MCV 80.7 80.2 81.2 80.2  PLT 261 236 217 211   Basic Metabolic Panel: Recent Labs  Lab 09/27/22 1849 09/27/22 2140 09/28/22 0350 09/29/22 0341  NA 133*  --  134* 136  K 3.1*  --  4.0 3.2*  CL 97*  --  103 100  CO2 24  --  22 27  GLUCOSE 81  --  78 112*  BUN 15  --  11 9  CREATININE 0.98 1.11* 0.95 0.86  CALCIUM 9.0  --  8.5* 8.7*   Liver Function Tests: Recent Labs  Lab 09/27/22 1849  AST 27  ALT 15  ALKPHOS 136*  BILITOT 0.9  PROT 8.1  ALBUMIN 3.7   CBG: No results for input(s): "GLUCAP" in the last 168 hours.  Discharge time spent: greater than 30 minutes.  Signed: Brendia Sacks, MD [facility] 09/29/2022

## 2022-09-29 NOTE — Progress Notes (Signed)
Reviewed written d/c instructions with pt and all questions answered. Pt verbalized understanding. Pt left in stable condition with all belongings.  

## 2022-09-29 NOTE — Hospital Course (Addendum)
47 year old woman PMH metastatic breast cancer, Crohn's disease, presented with poor appetite loose stools.  Imaging suggested small bowel obstruction.  Admitted for further evaluation.  Seen by general surgery.  Diet advanced.  Surgery discussed with GI who will follow-up in the outpatient setting given the unclear etiology of her SBO and abnormal flex sigmoidoscopy in May 2023 showing moderate stenosis at the side-to-side ileocolonic anastomosis.  Consultants General surgery  Procedures None

## 2022-10-03 ENCOUNTER — Telehealth: Payer: Self-pay | Admitting: Hematology and Oncology

## 2022-10-03 NOTE — Telephone Encounter (Signed)
Called to rescheduled appointments per provider PAL. While talking with the patient, she states she does not want to continue with tx due to it affecting her liver. I informed the patient that I will reach out to Dr.Gudena and Lillard Anes.

## 2022-10-04 ENCOUNTER — Telehealth: Payer: Self-pay | Admitting: Hematology and Oncology

## 2022-10-04 NOTE — Telephone Encounter (Signed)
Scheduled appointment per staff message. Patient is aware of the made appointment. 

## 2022-10-05 ENCOUNTER — Ambulatory Visit: Payer: Managed Care, Other (non HMO) | Attending: Internal Medicine | Admitting: Internal Medicine

## 2022-10-05 ENCOUNTER — Encounter: Payer: Self-pay | Admitting: Internal Medicine

## 2022-10-05 VITALS — BP 108/72 | HR 106 | Ht 64.0 in | Wt 129.4 lb

## 2022-10-05 DIAGNOSIS — Z5181 Encounter for therapeutic drug level monitoring: Secondary | ICD-10-CM | POA: Diagnosis not present

## 2022-10-05 DIAGNOSIS — Z79899 Other long term (current) drug therapy: Secondary | ICD-10-CM

## 2022-10-05 DIAGNOSIS — R000 Tachycardia, unspecified: Secondary | ICD-10-CM | POA: Diagnosis not present

## 2022-10-05 NOTE — Patient Instructions (Addendum)
Medication Instructions:  No changes  *If you need a refill on your cardiac medications before your next appointment, please call your pharmacy*   Lab Work: None   Testing/Procedures: An echocardiogram has been ordered for you and should be completed around 3 months from now (September 2024).   Follow-Up: At [facility], you and your health needs are our priority.  As part of our continuing mission to provide you with exceptional heart care, we have created designated Provider Care Teams.  These Care Teams include your primary Cardiologist (physician) and Advanced Practice Providers (APPs -  Physician Assistants and Nurse Practitioners) who all work together to provide you with the care you need, when you need it.  We recommend signing up for the patient portal called "MyChart".  Sign up information is provided on this After Visit Summary.  MyChart is used to connect with patients for Virtual Visits (Telemedicine).  Patients are able to view lab/test results, encounter notes, upcoming appointments, etc.  Non-urgent messages can be sent to your provider as well.   To learn more about what you can do with MyChart, go to [email or web address].    Your next appointment:   3 month(s).  Please make sure you have the echo done before this appointment.  Provider:   Maisie Fus, MD

## 2022-10-05 NOTE — Progress Notes (Signed)
Cardiology Office Note:    Date:  10/06/2022   ID:  [name], DOB November 28, 1975, MRN [id number]  PCP:  Ollen Bowl, MD   [facility] Providers Cardiologist:  Maisie Fus, MD     Referring MD: Lillard Anes Cornett*   No chief complaint on file. Grade 1 DD  History of Present Illness:    [name] is a 47 y.o. 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 She had a recent echo in June that noted grade 1 DD.  Normal EKG. No family hx of cardiac dx. No smoking. No DM2.  Cardio-Onc Hx: - CVD Risk/ABCDE: none - Anthracycline therapy - No - HER2 therapy (trastuzumab and pertuzumab)- yes since 06/2020;  - Radiation - R chest wall - ICI- no - TKI- no   Past Medical History:  Diagnosis Date   Cancer (HCC)    Colon stricture (HCC) 05/07/2019   Crohn's colitis, with intestinal obstruction (HCC) 09/17/2013   Dx 2006 - right and left colon involved 2011 - pan-colitis 09/17/2013 left colitis 60-20 cm March 2021 subtotal colectomy ileosigmoid anastomosis for colonic strictures-Dr. Maisie Fus     Crohn's disease [facility])    Family history of breast cancer 06/11/2020   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 10/17/2010   Rectal bleeding    Uterine fibroid    Vitamin D deficiency 09/24/2013    Past Surgical History:  Procedure Laterality Date   AXILLARY LYMPH NODE DISSECTION Right 11/09/2020   Procedure: RIGHT AXILLARY LYMPH NODE DISSECTION;  Surgeon: Emelia Loron, MD;  Location: [facility];  Service: General;  Laterality: Right;   BIOPSY  07/01/2020   Procedure: BIOPSY;  Surgeon: Benancio Deeds, MD;  Location: [facility];  Service: Gastroenterology;;   BIOPSY  01/27/2021   Procedure: BIOPSY;  Surgeon: Benancio Deeds, MD;  Location: [facility];  Service: Gastroenterology;;   BIOPSY   09/07/2021   Procedure: BIOPSY;  Surgeon: Lynann Bologna, MD;  Location: [facility];  Service: Gastroenterology;;   BREAST BIOPSY Right 03/24/2022   [place] RT BREAST BX W LOC DEV 1ST LESION IMG BX SPEC US GUIDE 03/24/2022 [facility]   BREAST BIOPSY  04/28/2022   [place] RT RADIOACTIVE SEED LOC 04/28/2022 [facility]   BREAST IMPLANT REMOVAL Right 08/07/2022   Procedure: REMOVAL  OF RIGHT BREAST IMPLANTS;  Surgeon: Allena Napoleon, MD;  Location: [facility];  Service: Plastics;  Laterality: Right;   BREAST RECONSTRUCTION WITH PLACEMENT OF TISSUE EXPANDER AND FLEX HD (ACELLULAR HYDRATED DERMIS) Bilateral 11/09/2020   Procedure: BILATERAL BREAST RECONSTRUCTION WITH PLACEMENT OF TISSUE EXPANDER AND FLEX HD (ACELLULAR HYDRATED DERMIS);  Surgeon: Allena Napoleon, MD;  Location: [facility];  Service: Plastics;  Laterality: Bilateral;   CAPSULOTOMY Bilateral 07/25/2021   Procedure: CAPSULOTOMY;  Surgeon: Allena Napoleon, MD;  Location: [facility];  Service: Plastics;  Laterality: Bilateral;   COLECTOMY  06/27/2019   COLONOSCOPY  2015   FLEXIBLE SIGMOIDOSCOPY N/A 07/01/2020   Procedure: FLEXIBLE SIGMOIDOSCOPY;  Surgeon: Benancio Deeds, MD;  Location: [facility];  Service: Gastroenterology;  Laterality: N/A;   FLEXIBLE SIGMOIDOSCOPY N/A 01/27/2021   Procedure: FLEXIBLE SIGMOIDOSCOPY;  Surgeon: Benancio Deeds, MD;  Location: [facility];  Service: Gastroenterology;  Laterality: N/A;   FLEXIBLE SIGMOIDOSCOPY N/A 09/07/2021   Procedure: FLEXIBLE SIGMOIDOSCOPY;  Surgeon: Lynann Bologna,  MD;  Location: [facility];  Service: Gastroenterology;  Laterality: N/A;   NIPPLE SPARING MASTECTOMY Bilateral 11/09/2020   Procedure: BILATERAL NIPPLE SPARING MASTECTOMY;  Surgeon: Emelia Loron, MD;  Location: [facility];  Service: General;  Laterality: Bilateral;   PORT-A-CATH REMOVAL N/A 07/25/2021   Procedure: REMOVAL PORT-A-CATH;  Surgeon:  Allena Napoleon, MD;  Location: [facility];  Service: Plastics;  Laterality: N/A;   PORTACATH PLACEMENT N/A 06/23/2020   Procedure: INSERTION PORT-A-CATH;  Surgeon: Emelia Loron, MD;  Location: [facility];  Service: General;  Laterality: N/A;  START TIME OF 11:00 AM FOR 60 MINUTES [place] [facility]   PORTACATH PLACEMENT Left 05/02/2022   Procedure: INSERTION PORT-A-CATH;  Surgeon: Emelia Loron, MD;  Location: [facility];  Service: General;  Laterality: Left;   RADIOACTIVE SEED GUIDED EXCISIONAL BREAST BIOPSY Right 05/02/2022   Procedure: RADIOACTIVE SEED GUIDED RIGHT BREAST MASS EXCISION;  Surgeon: Emelia Loron, MD;  Location: [facility];  Service: General;  Laterality: Right;   REMOVAL OF BILATERAL TISSUE EXPANDERS WITH PLACEMENT OF BILATERAL BREAST IMPLANTS Bilateral 07/25/2021   Procedure: REMOVAL OF BILATERAL TISSUE EXPANDERS WITH PLACEMENT OF BILATERAL BREAST IMPLANTS;  Surgeon: Allena Napoleon, MD;  Location: [facility];  Service: Plastics;  Laterality: Bilateral;  1.5    Current Medications: Current Meds  Medication Sig   acetaminophen (TYLENOL) 500 MG tablet Take 500-1,000 mg by mouth every 6 (six) hours as needed for mild pain.   Multiple Vitamins-Minerals (MULTIVITAMIN WITH MINERALS) tablet Take 1 tablet by mouth daily.   potassium chloride SA (KLOR-CON M) 20 MEQ tablet Take 1 tablet (20 mEq total) by mouth daily.     Allergies:   Nsaids and Other   Social History   Socioeconomic History   Marital status: Single    Spouse name: Not on file   Number of children: 1   Years of education: 13   Highest education level: Not on file  Occupational History   Occupation: Medical Coder   Tobacco Use   Smoking status: Former    Types: Cigarettes   Smokeless tobacco: Never  Vaping Use   Vaping Use: Never used  Substance and Sexual Activity   Alcohol use: Yes    Alcohol/week: 1.0 - 2.0 standard  drink of alcohol    Types: 1 - 2 Glasses of wine per week   Drug use: No   Sexual activity: Yes    Partners: Male    Comment: Intermittent protection not consistent  Other Topics Concern   Not on file  Social History Narrative   Single, 1 daughter born 2001 approximately   Medical coder for [employer]   She is a former smoker, occasional alcohol no drug use   Social Determinants of Corporate investment banker Strain: Not on file  Food Insecurity: No Food Insecurity (09/27/2022)   Hunger Vital Sign    Worried About Running Out of Food in the Last Year: Never true    Ran Out of Food in the Last Year: Never true  Transportation Needs: No Transportation Needs (09/27/2022)   PRAPARE - Administrator, Civil Service (Medical): No    Lack of Transportation (Non-Medical): No  Physical Activity: Not on file  Stress: Not on file  Social Connections: Not on file     Family History: The patient's family history includes Breast cancer in her cousin and maternal aunt; Diabetes in her father and paternal aunt. There is no  history of Esophageal cancer, Rectal cancer, Stomach cancer, Colon polyps, or Colon cancer.  ROS:   Please see the history of present illness.     All other systems reviewed and are negative.  EKGs/Labs/Other Studies Reviewed:    The following studies were reviewed today:  TTE 06/16/20 EF 60-65%, nl RV fxn, no valve dx  TTE 10/01/2020 EF 60-65%; GLS -17%  TTE 01/12/21 EF 60-65%, GLS - 20%  TTE 04/20/2021 EF 60-65%  TTE 05/24/2021 EF 60-65%  TTE 09/18/2022 EF 55-60% Grade I DD      Recent Labs: 10/25/2021: Magnesium 1.7; TSH 2.82 09/27/2022: ALT 15 09/29/2022: BUN 9; Creatinine, Ser 0.86; Hemoglobin 10.1; Platelets 211; Potassium 3.2; Sodium 136   Recent Lipid Panel No results found for: "CHOL", "TRIG", "HDL", "CHOLHDL", "VLDL", "LDLCALC", "LDLDIRECT"   Risk Assessment/Calculations:     Physical Exam:    VS:  BP 108/72 (BP Location: Left Arm,  Patient Position: Sitting, Cuff Size: Normal)   Pulse (!) 106   Ht 5\' 4"  (1.626 m)   Wt 129 lb 6.4 oz (58.7 kg)   LMP 04/24/2021   SpO2 99%   BMI 22.21 kg/m     Wt Readings from Last 3 Encounters:  10/05/22 129 lb 6.4 oz (58.7 kg)  09/27/22 125 lb 10.6 oz (57 kg)  09/21/22 126 lb 6.4 oz (57.3 kg)     GEN:  Well nourished, well developed in no acute distress HEENT: Normal CARDIAC: RRR, no murmurs, rubs, gallops RESPIRATORY:  Clear to auscultation without rales, wheezing or rhonchi  ABDOMEN: Soft, non-tender, non-distended MUSCULOSKELETAL:  No edema; No deformity  SKIN: Warm and dry NEUROLOGIC:  Alert and oriented x 3 PSYCHIATRIC:  Normal affect   ASSESSMENT:   Grade 1 DD Discussed that this can be seen with aging, not pathologic. Cancer therapy can accelerate the aging process. She has no signs of diastolic dysfunction. She is low risk for CTRCD.  Will get TTE in September PLAN:    In order of problems listed above:  TTE Limited GLS 12/2022 Follow up 3 months     Medication Adjustments/Labs and Tests Ordered: Current medicines are reviewed at length with the patient today.  Concerns regarding medicines are outlined above.  Orders Placed This Encounter  Procedures   ECHOCARDIOGRAM LIMITED   No orders of the defined types were placed in this encounter.   Patient Instructions  Medication Instructions:  No changes  *If you need a refill on your cardiac medications before your next appointment, please call your pharmacy*   Lab Work: None   Testing/Procedures: An echocardiogram has been ordered for you and should be completed around 3 months from now (September 2024).   Follow-Up: At [facility], you and your health needs are our priority.  As part of our continuing mission to provide you with exceptional heart care, we have created designated Provider Care Teams.  These Care Teams include your primary Cardiologist (physician) and Advanced Practice  Providers (APPs -  Physician Assistants and Nurse Practitioners) who all work together to provide you with the care you need, when you need it.  We recommend signing up for the patient portal called "MyChart".  Sign up information is provided on this After Visit Summary.  MyChart is used to connect with patients for Virtual Visits (Telemedicine).  Patients are able to view lab/test results, encounter notes, upcoming appointments, etc.  Non-urgent messages can be sent to your provider as well.   To learn more about what you can  do with MyChart, go to [email or web address].    Your next appointment:   3 month(s).  Please make sure you have the echo done before this appointment.  Provider:   Maisie Fus, MD      Signed, Maisie Fus, MD  10/06/2022 8:00 PM    [facility]

## 2022-10-07 NOTE — Progress Notes (Signed)
Patient Care Team: Ollen Bowl, MD as PCP - General (Internal Medicine) Maisie Fus, MD as PCP - Cardiology (Cardiology) Pershing Proud, RN as Oncology Nurse Navigator Donnelly Angelica, RN as Oncology Nurse Navigator Romie Levee, MD as Consulting Physician (Colon and Rectal Surgery) Iva Boop, MD as Consulting Physician (Gastroenterology) Emelia Loron, MD as Consulting Physician (General Surgery) Serena Croissant, MD as Consulting Physician (Hematology and Oncology)  DIAGNOSIS: No diagnosis found.  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 palpable right breast mass measuring about 7 cm x 5 and half centimeters, palpable right axillary lymph node with concern for skin invasion    06/08/2020 Cancer Staging   Staging form: Breast, AJCC 8th Edition - Clinical: Stage IIIB (cT4d, cN1, cM0, G3, ER+, PR-, HER2+) - Signed by Serena Croissant, MD on 07/07/2020 Histologic grading system: 3 grade system   06/24/2020 Genetic Testing   No pathogenic variants detected in [employer]-Cancer +RNAinsight.  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  The report date is June 24, 2020.   The CustomNext-Cancer+RNAinsight panel offered by [employer] includes sequencing and rearrangement analysis for the following 47 genes:  APC, ATM, AXIN2, BARD1, BMPR1A, BRCA1, BRCA2, BRIP1, CDH1, CDK4, CDKN2A, CHEK2, DICER1, EPCAM, GREM1, HOXB13, MEN1, MLH1, MSH2, MSH3, MSH6, MUTYH, NBN, NF1, NF2, NTHL1, PALB2, PMS2, POLD1, POLE, PTEN, RAD51C, RAD51D, RECQL, RET, SDHA, SDHAF2,  SDHB, SDHC, SDHD, SMAD4, SMARCA4, STK11, TP53, TSC1, TSC2, and VHL.  RNA data is routinely analyzed for use in variant interpretation for all genes.   06/25/2020 - 10/29/2020 Chemotherapy   TCHP x6 cycles   11/09/2020 Surgery   Bilateral mastectomies: Left mastectomy: Benign Right mastectomy: Residual microinvasive cancer status post neoadjuvant therapy, DCIS, 0/12 lymph nodes, ER +1%, PR negative, HER2 equivocal 2+ by [facility]   11/19/2020 - 07/15/2021 Chemotherapy   Patient is on Treatment Plan : BREAST Trastuzumab  + Pertuzumab q21d x 13 cycles     01/11/2021 - 03/09/2021 Radiation Therapy   Site Technique Total Dose (Gy) Dose per Fx (Gy) Completed Fx Beam Energies  Chest Wall, Right: CW_Rt 3D 50.4/50.4 1.8 28/28 10X  Chest Wall, Right: CW_Rt_SCLV 3D 50.4/50.4 1.8 28/28 6X, 10X  Chest Wall, Right: CW_Rt_Bst Electron 10/10 2 5/5 6E     06/08/2021 - 06/24/2021 Anti-estrogen oral therapy   Tamoxifen (discontinued)   03/24/2022 Relapse/Recurrence   Right chest palpable nodule: 1.2 cm mass at 10 o'clock position biopsy revealed grade 2 IDC with high-grade DCIS ER 20% positive weak staining, PR 0%, HER2 3+ positive, Ki-67 50%   04/06/2022 Imaging   CT chest/abd/pelvis  IMPRESSION: 1. No evidence of metastatic disease in the chest, abdomen or pelvis. 2. Similar probable postradiation change in the peripheral anterior right upper lobe, anterior right middle lobe, lateral right lower lobe and right lung apex. 3. Surgical changes of subtotal colectomy with short segment wall thickening of the neo terminal ileum and short segments of small bowel throughout the abdomen pelvis, with interposed gas and fluid-filled distended loops of small bowel intermixed between the areas of inflammation. Findings are compatible active  inflammatory Crohn's enteritis. 4. Small volume right lower quadrant and pelvic free fluid, likely reactive.     Electronically Signed   By: Maudry Mayhew M.D.   On: 04/06/2022  12:53     05/02/2022 Surgery   Right lumpectomy: Recurrent grade 3 IDC, posterior margin focally involved by cancer.   05/17/2022 -  Chemotherapy   Patient is on Treatment Plan : BREAST ADO-Trastuzumab Emtansine (Kadcyla) q21d       CHIEF COMPLIANT: Cycle 5 Kadcyla     INTERVAL HISTORY: [name] is a 47 y.o. with above-mentioned history of HER-2 positive right breast cancer currently on Kadcyla maintenance. She presents to the clinic today for follow-up.    ALLERGIES:  is allergic to nsaids and other.  MEDICATIONS:  Current Outpatient Medications  Medication Sig Dispense Refill   acetaminophen (TYLENOL) 500 MG tablet Take 500-1,000 mg by mouth every 6 (six) hours as needed for mild pain.     Multiple Vitamins-Minerals (MULTIVITAMIN WITH MINERALS) tablet Take 1 tablet by mouth daily.     potassium chloride SA (KLOR-CON M) 20 MEQ tablet Take 1 tablet (20 mEq total) by mouth daily. 90 tablet 2   No current facility-administered medications for this visit.    PHYSICAL EXAMINATION: ECOG PERFORMANCE STATUS: {CHL ONC ECOG [phone]}  There were no vitals filed for this visit. There were no vitals filed for this visit.  BREAST:*** No palpable masses or nodules in either right or left breasts. No palpable axillary supraclavicular or infraclavicular adenopathy no breast tenderness or nipple discharge. (exam performed in the presence of a chaperone)  LABORATORY DATA:  I have reviewed the data as listed    Latest Ref Rng & Units 09/29/2022    3:41 AM 09/28/2022    3:50 AM 09/27/2022    9:40 PM  CMP  Glucose 70 - 99 mg/dL 161  78    BUN 6 - 20 mg/dL 9  11    Creatinine 0.96 - 1.00 mg/dL 0.45  4.09  8.11   Sodium 135 - 145 mmol/L 136  134    Potassium 3.5 - 5.1 mmol/L 3.2  4.0    Chloride 98 - 111 mmol/L 100  103    CO2 22 - 32 mmol/L 27  22    Calcium 8.9 - 10.3 mg/dL 8.7  8.5      Lab Results  Component Value Date   WBC 7.7 09/29/2022   HGB 10.1 (L) 09/29/2022    HCT 32.0 (L) 09/29/2022   MCV 80.2 09/29/2022   PLT 211 09/29/2022   NEUTROABS 5.4 09/29/2022    ASSESSMENT & PLAN:  No problem-specific Assessment & Plan notes found for this encounter.    No orders of the defined types were placed in this encounter.  The patient has a good understanding of the overall plan. she agrees with it. she will call with any problems that may develop before the next visit here. Total time spent: 30 mins including face to face time and time spent for planning, charting and co-ordination of care   Sherlyn Lick, CMA 10/07/22    I Janan Ridge am acting as a Neurosurgeon for [employer]  ***

## 2022-10-10 NOTE — Telephone Encounter (Signed)
Left message for pt to return the call to set up an appt.

## 2022-10-11 ENCOUNTER — Other Ambulatory Visit: Payer: Self-pay | Admitting: Hematology and Oncology

## 2022-10-11 ENCOUNTER — Inpatient Hospital Stay: Payer: Managed Care, Other (non HMO) | Attending: Hematology and Oncology | Admitting: Hematology and Oncology

## 2022-10-11 ENCOUNTER — Encounter: Payer: Self-pay | Admitting: Hematology and Oncology

## 2022-10-11 DIAGNOSIS — D6481 Anemia due to antineoplastic chemotherapy: Secondary | ICD-10-CM | POA: Insufficient documentation

## 2022-10-11 DIAGNOSIS — R5383 Other fatigue: Secondary | ICD-10-CM | POA: Insufficient documentation

## 2022-10-11 DIAGNOSIS — C50411 Malignant neoplasm of upper-outer quadrant of right female breast: Secondary | ICD-10-CM | POA: Diagnosis not present

## 2022-10-11 DIAGNOSIS — Z170 Estrogen receptor positive status [ER+]: Secondary | ICD-10-CM

## 2022-10-11 DIAGNOSIS — E876 Hypokalemia: Secondary | ICD-10-CM | POA: Insufficient documentation

## 2022-10-11 DIAGNOSIS — R197 Diarrhea, unspecified: Secondary | ICD-10-CM | POA: Insufficient documentation

## 2022-10-11 NOTE — Assessment & Plan Note (Signed)
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 palpable right breast mass measuring about 7 cm x 5 and half centimeters, palpable right axillary lymph node with concern for skin invasion    Treatment Plan: 1. Neoadjuvant chemo with TCHP q 3 weeks X 6 followed by HP completed 07/07/2021 2. Bilateral mastectomies: Left mastectomy: Benign Right mastectomy: Residual microinvasive cancer status post neoadjuvant therapy, DCIS, 0/12 lymph nodes, ER +1%, PR negative, HER2 equivocal 2+ by IHC 3. Adj XRT completed 02/23/2021 4. Adj Anti-estrogen therapy with tamoxifen started 06/08/2021 discontinued 06/24/2021 (ER was only 1% therefore we felt risks outweigh benefits) 5. Neratinib: Because of her bowel issues she decided not to take it. --------------------------------------------------------------------------------------------------------- Hospitalization 05/18/2021-06/13/2021 (small bowel obstruction) Right chest palpable nodule: 1.2 cm mass at 10 o'clock position biopsy revealed grade 2 IDC with high-grade DCIS ER 20% positive weak staining, PR 0%, HER2 3+ positive, Ki-67 50%   05/02/2022: Right lumpectomy: Recurrent grade 3 IDC, posterior margin focally involved by cancer April 2024: Skin right breast excision: Benign   Current treatment: Kadcyla cycle 6 Kadcyla toxicities: Diarrhea subsided Fatigue that lasted 3 days Hypokalemia: Currently on potassium supplement Chemotherapy-induced anemia: Taking iron tablet once a day today's hemoglobin is 10.  Monitoring closely.  Return to clinic 3 weeks for cycle 7

## 2022-10-13 ENCOUNTER — Inpatient Hospital Stay: Payer: Managed Care, Other (non HMO)

## 2022-10-13 ENCOUNTER — Other Ambulatory Visit: Payer: Managed Care, Other (non HMO)

## 2022-10-13 ENCOUNTER — Ambulatory Visit: Payer: Managed Care, Other (non HMO)

## 2022-10-13 ENCOUNTER — Ambulatory Visit: Payer: Managed Care, Other (non HMO) | Admitting: Hematology and Oncology

## 2022-10-13 ENCOUNTER — Other Ambulatory Visit: Payer: Self-pay

## 2022-10-13 ENCOUNTER — Inpatient Hospital Stay: Payer: Managed Care, Other (non HMO) | Admitting: Dietician

## 2022-10-13 VITALS — BP 116/84 | HR 108 | Temp 98.7°F | Resp 18

## 2022-10-13 DIAGNOSIS — Z95828 Presence of other vascular implants and grafts: Secondary | ICD-10-CM

## 2022-10-13 DIAGNOSIS — Z170 Estrogen receptor positive status [ER+]: Secondary | ICD-10-CM

## 2022-10-13 DIAGNOSIS — R5383 Other fatigue: Secondary | ICD-10-CM | POA: Diagnosis not present

## 2022-10-13 DIAGNOSIS — D6481 Anemia due to antineoplastic chemotherapy: Secondary | ICD-10-CM | POA: Diagnosis not present

## 2022-10-13 DIAGNOSIS — E876 Hypokalemia: Secondary | ICD-10-CM | POA: Diagnosis not present

## 2022-10-13 DIAGNOSIS — C50411 Malignant neoplasm of upper-outer quadrant of right female breast: Secondary | ICD-10-CM | POA: Diagnosis present

## 2022-10-13 DIAGNOSIS — R197 Diarrhea, unspecified: Secondary | ICD-10-CM | POA: Diagnosis not present

## 2022-10-13 MED ORDER — SODIUM CHLORIDE 0.9 % IV SOLN: INTRAVENOUS | Status: DC

## 2022-10-13 MED ORDER — SODIUM CHLORIDE 0.9% FLUSH
10.0000 mL | Freq: Once | INTRAVENOUS | Status: AC
  Administered 2022-10-13: 10 mL

## 2022-10-13 MED ORDER — HEPARIN SOD (PORK) LOCK FLUSH 100 UNIT/ML IV SOLN
500.0000 [IU] | Freq: Once | INTRAVENOUS | Status: AC
  Administered 2022-10-13: 500 [IU]

## 2022-10-13 NOTE — Progress Notes (Signed)
Nutrition Follow-up:  Patient with HER-2 positive right breast cancer. S/p bilateral mastectomies. She completed 5 cycles Kadcyla. Patient stopped maintenance therapy secondary to adverse side effects.   6/19-6/21 hospital admission - enteritis; SBO  Met with patient in office. She appears weak and complains of no appetite. Patient denies altered taste, nausea/vomiting with intake. She states she has no desire to eat or drink and is "wasting away." Her last "real meal" was Sunday. Patient had sheppard's pie and beans. This tasted good and patient tolerated. Patient ate some peaches yesterday. She has not eaten today. Patient is requesting supportive therapy with IV fluids. Patient left message with Dr. Earmon Phoenix nurse and awaiting return call. RD contacted RN Nelly Rout). Patient will receive IV hydration today. She is agreeable to this. Patient reports liking the [employer] oral supplement given during admission. She feels she could drink 1-2/day. Patient reports diarrhea resolved s/p imodium.   Medications: reviewed   Labs: 6/21 labs reviewed   Anthropometrics: Last wt 129 lb 6.4 oz on 6/27  6/13 - 126 lb 6.4 oz 5/24 - 131 lb 4/29 - 132 lb 4.4 oz  3/22 - 133 lb 12.8 oz 2/14 - 131 lb 6.4 oz    NUTRITION DIAGNOSIS: Food and nutrition related knowledge deficit continues   INTERVENTION:  Discussed strategies for poor appetite. Educated on eating within first hour of waking, followed by bites/sips q2h Reviewed low/high fiber foods - handout provided Discussed strategies for diarrhea, foods best tolerated and foods to limit - samples of Banatrol provided  Provided samples of [employer] 1.4, encouraged 1-2/day as tolerated to promote wt gain (ordering information provided) Supportive listening and encouragement     MONITORING, EVALUATION, GOAL: weight trends, intake   NEXT VISIT: To be scheduled as needed. Patient has contact information

## 2022-10-13 NOTE — Patient Instructions (Signed)

## 2022-10-13 NOTE — Progress Notes (Signed)
Pt with right br ca. She is here seeing nutrition today and reports she "feels dehydrated" and is asking for IVF. She is currently on Kadcyla q21d, last tx 6/13, last labs 6/21 K was 3.2 (currently on KLOR CON 20 meq every day).  Above information reported to Dr Bertis Ruddy. Verbal order given for 1L NS over an hr. Pt scheduled today for IVF

## 2022-10-16 IMAGING — MG MM BREAST LOCALIZATION CLIP
6 series · 6 of 18 positions shown · non-contrast
Comparison: Previous exam(s).

CLINICAL DATA: Evaluate CYLINDER clip following MR guided LEFT
breast biopsy.

EXAM:
3D DIAGNOSTIC LEFT MAMMOGRAM POST MRI BIOPSY

[L CC synth-2D]
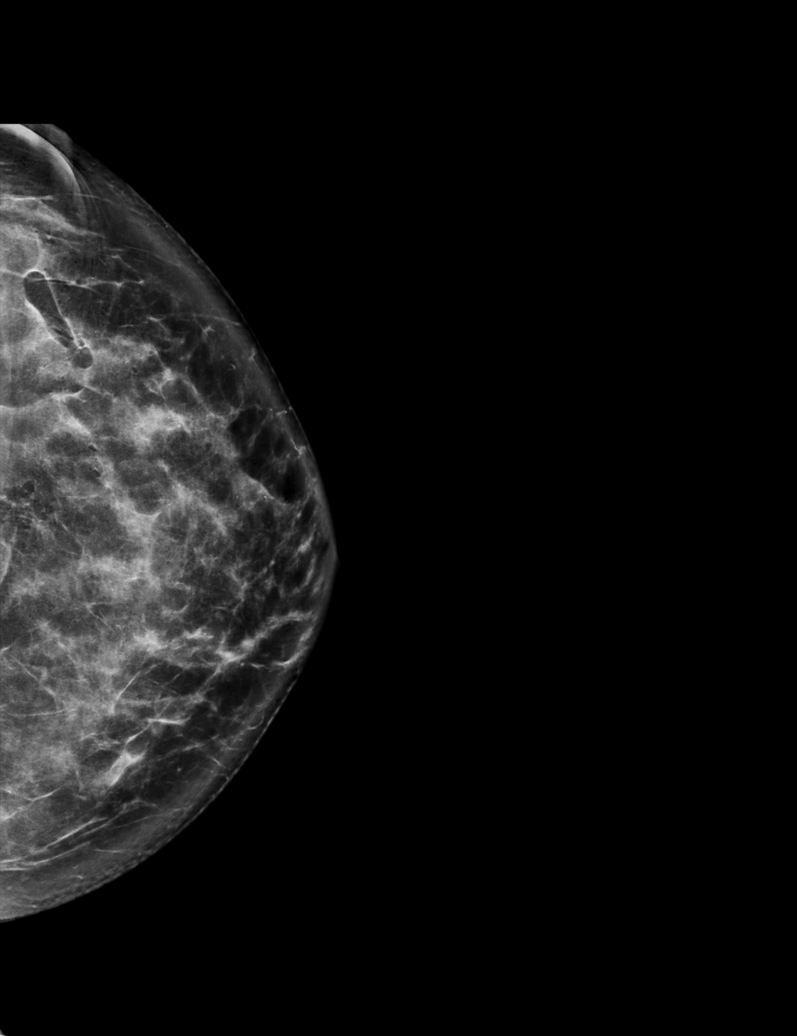

[L XCCL synth-2D]
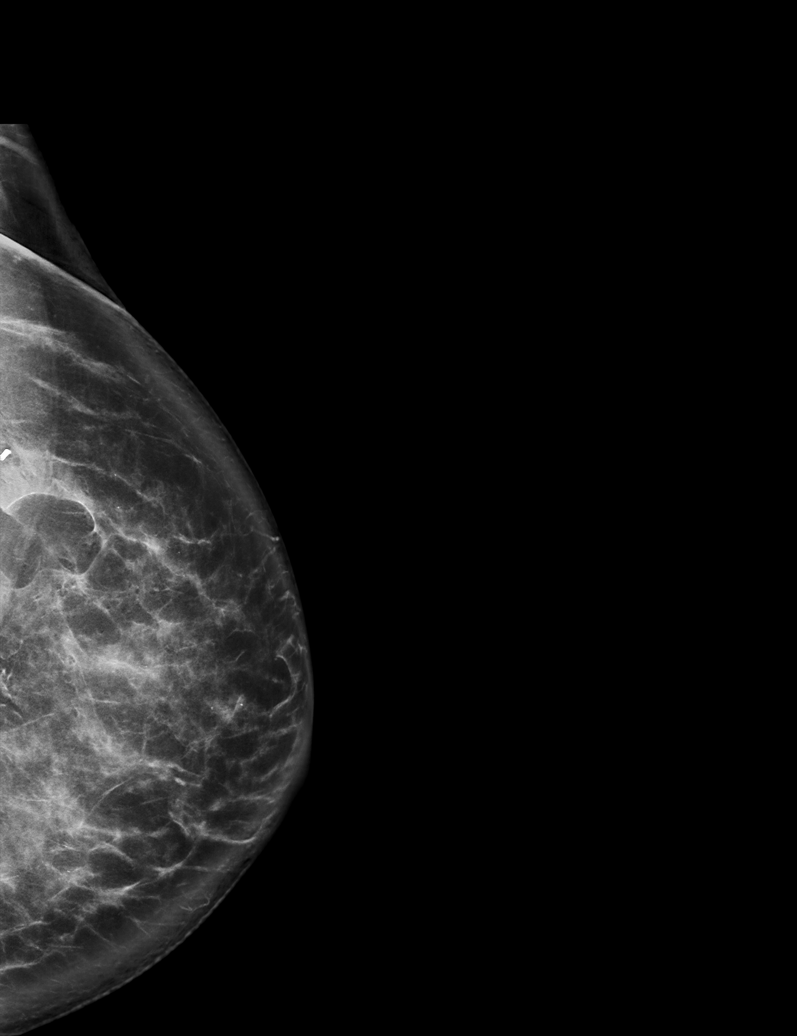

[L LM synth-2D]
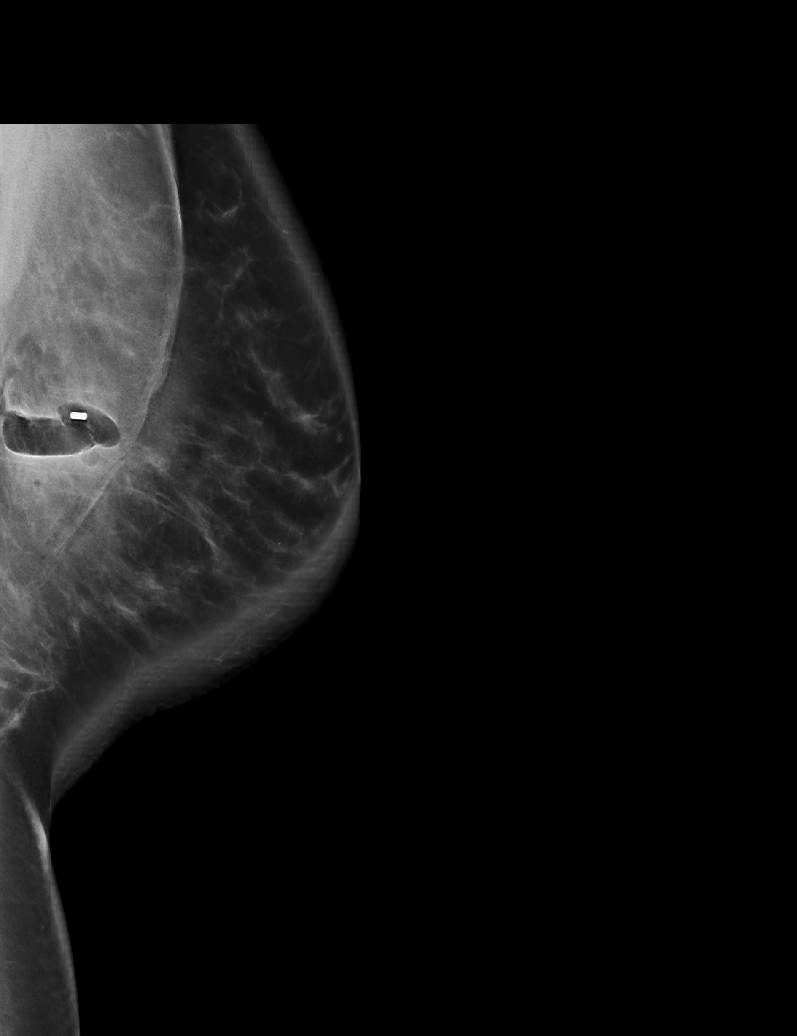

[L CC tomo · tomo slice 40/79.0]
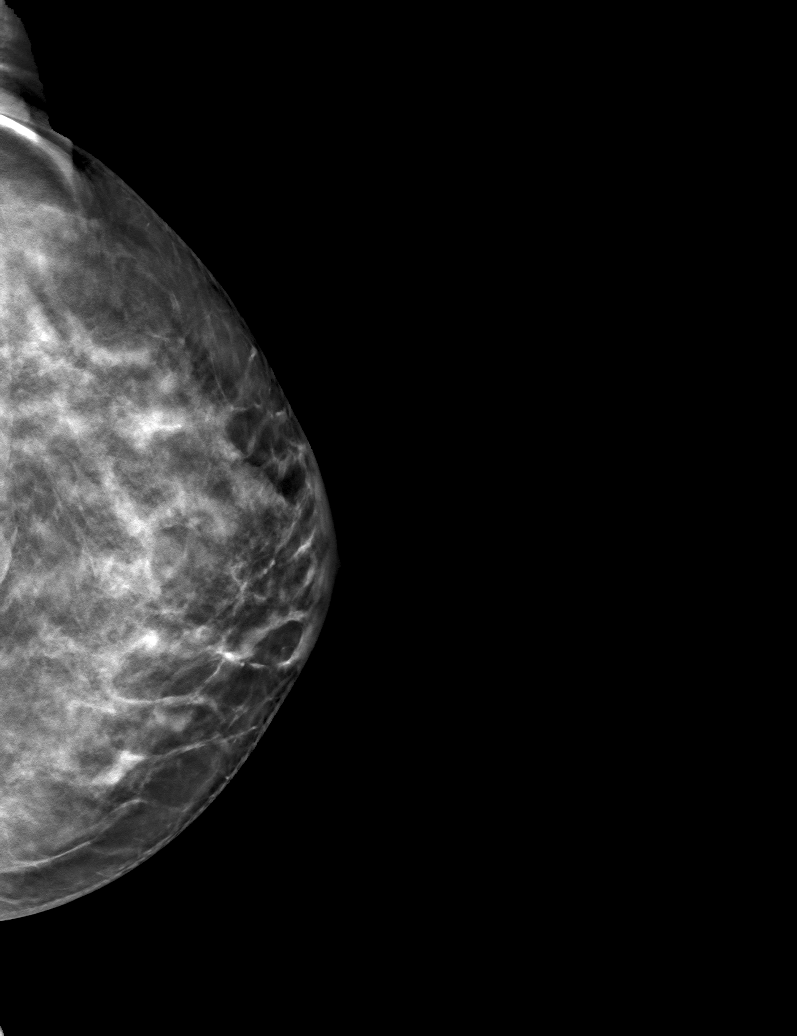

[L LM tomo · tomo slice 57/112.0]
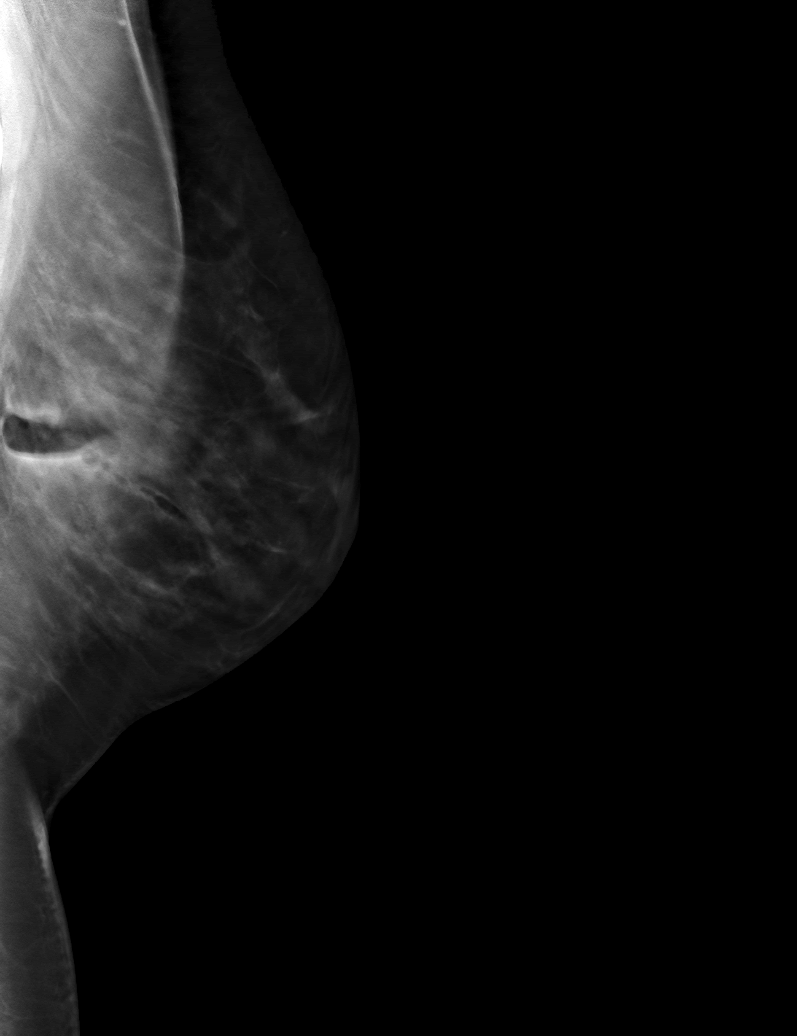

[L XCCL tomo · tomo slice 43/84.0]
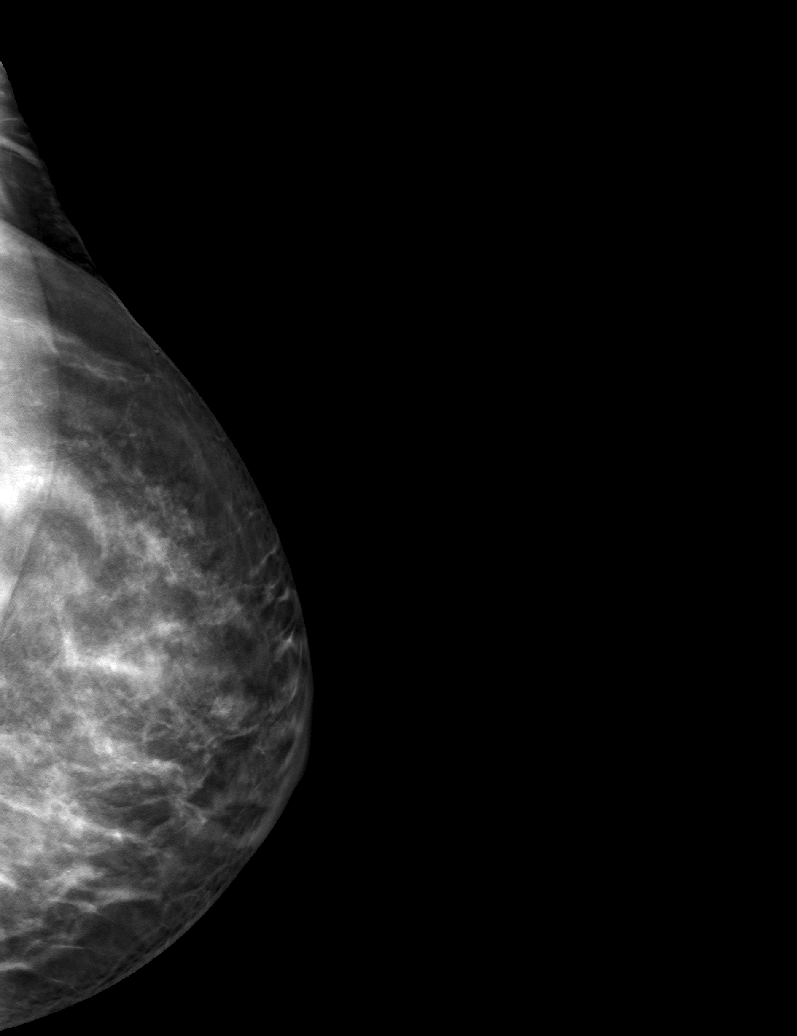

[6 of 18 positions shown; findings below may reference images not displayed]

FINDINGS: 3D Mammographic images were obtained following MR guided biopsy of
0.6 cm OUTER LEFT breast mass. The CYLINDER biopsy marking clip is
in expected position at the site of biopsy.
IMPRESSION: Appropriate positioning of the CYLINDER shaped biopsy marking clip
at the site of biopsy in the OUTER LEFT breast.

Final Assessment: Post Procedure Mammograms for Marker Placement

## 2022-11-01 ENCOUNTER — Encounter: Payer: Self-pay | Admitting: *Deleted

## 2022-11-01 DIAGNOSIS — C50411 Malignant neoplasm of upper-outer quadrant of right female breast: Secondary | ICD-10-CM

## 2022-11-02 ENCOUNTER — Other Ambulatory Visit: Payer: Managed Care, Other (non HMO)

## 2022-11-03 ENCOUNTER — Ambulatory Visit: Payer: Managed Care, Other (non HMO) | Admitting: Adult Health

## 2022-11-03 ENCOUNTER — Ambulatory Visit: Payer: Managed Care, Other (non HMO)

## 2022-11-03 ENCOUNTER — Other Ambulatory Visit: Payer: Managed Care, Other (non HMO)

## 2022-11-03 ENCOUNTER — Ambulatory Visit: Payer: Managed Care, Other (non HMO) | Admitting: Hematology and Oncology

## 2022-11-07 ENCOUNTER — Ambulatory Visit: Payer: Managed Care, Other (non HMO) | Admitting: Nutrition

## 2022-11-07 NOTE — Progress Notes (Signed)
Patient left message requesting [name] ONS samples. I placed an order for samples on the [employer] and contacted the patient to let her know. She denies other needs at this time. She was appreciative.

## 2022-11-13 ENCOUNTER — Encounter: Payer: Self-pay | Admitting: Hematology and Oncology

## 2022-11-13 ENCOUNTER — Ambulatory Visit: Payer: Managed Care, Other (non HMO)

## 2022-11-13 ENCOUNTER — Other Ambulatory Visit: Payer: Self-pay

## 2022-11-13 ENCOUNTER — Telehealth: Payer: Self-pay

## 2022-11-13 DIAGNOSIS — Z170 Estrogen receptor positive status [ER+]: Secondary | ICD-10-CM

## 2022-11-13 NOTE — Telephone Encounter (Signed)
S/w pt via phone per MyChart message. Per MD, OK for pt to come in tomorrow for IVF. Pt states she is having difficulty with drinking fluids, stating she has to force herself to drink. Orders placed for 1L NS over one hr per MD. Pt understands to arrive at 0945 for check in.

## 2022-11-14 ENCOUNTER — Inpatient Hospital Stay: Payer: Managed Care, Other (non HMO) | Attending: Hematology and Oncology

## 2022-11-14 ENCOUNTER — Other Ambulatory Visit: Payer: Self-pay

## 2022-11-14 VITALS — BP 128/96 | HR 113 | Temp 98.1°F | Resp 16 | Wt 111.0 lb

## 2022-11-14 DIAGNOSIS — C50411 Malignant neoplasm of upper-outer quadrant of right female breast: Secondary | ICD-10-CM | POA: Insufficient documentation

## 2022-11-14 DIAGNOSIS — Z452 Encounter for adjustment and management of vascular access device: Secondary | ICD-10-CM | POA: Diagnosis not present

## 2022-11-14 DIAGNOSIS — Z95828 Presence of other vascular implants and grafts: Secondary | ICD-10-CM

## 2022-11-14 DIAGNOSIS — Z170 Estrogen receptor positive status [ER+]: Secondary | ICD-10-CM

## 2022-11-14 MED ORDER — HEPARIN SOD (PORK) LOCK FLUSH 100 UNIT/ML IV SOLN
500.0000 [IU] | Freq: Once | INTRAVENOUS | Status: AC
  Administered 2022-11-14: 500 [IU]

## 2022-11-14 MED ORDER — SODIUM CHLORIDE 0.9 % IV SOLN: INTRAVENOUS | Status: DC

## 2022-11-14 MED ORDER — SODIUM CHLORIDE 0.9% FLUSH
10.0000 mL | Freq: Once | INTRAVENOUS | Status: AC
  Administered 2022-11-14: 10 mL

## 2022-11-14 NOTE — Patient Instructions (Signed)

## 2022-11-18 ENCOUNTER — Inpatient Hospital Stay (HOSPITAL_BASED_OUTPATIENT_CLINIC_OR_DEPARTMENT_OTHER)
Admission: EM | Admit: 2022-11-18 | Discharge: 2022-11-23 | DRG: 389 | Disposition: A | Discharge status: Home / Self Care | Payer: Managed Care, Other (non HMO) | Attending: Family Medicine | Admitting: Family Medicine

## 2022-11-18 ENCOUNTER — Other Ambulatory Visit: Payer: Self-pay

## 2022-11-18 ENCOUNTER — Encounter (HOSPITAL_BASED_OUTPATIENT_CLINIC_OR_DEPARTMENT_OTHER): Payer: Self-pay

## 2022-11-18 ENCOUNTER — Emergency Department (HOSPITAL_BASED_OUTPATIENT_CLINIC_OR_DEPARTMENT_OTHER): Payer: Managed Care, Other (non HMO)

## 2022-11-18 DIAGNOSIS — R1084 Generalized abdominal pain: Secondary | ICD-10-CM | POA: Diagnosis not present

## 2022-11-18 DIAGNOSIS — I5032 Chronic diastolic (congestive) heart failure: Secondary | ICD-10-CM | POA: Diagnosis present

## 2022-11-18 DIAGNOSIS — K50912 Crohn's disease, unspecified, with intestinal obstruction: Secondary | ICD-10-CM | POA: Diagnosis not present

## 2022-11-18 DIAGNOSIS — K50812 Crohn's disease of both small and large intestine with intestinal obstruction: Secondary | ICD-10-CM | POA: Diagnosis present

## 2022-11-18 DIAGNOSIS — Z980 Intestinal bypass and anastomosis status: Secondary | ICD-10-CM | POA: Diagnosis not present

## 2022-11-18 DIAGNOSIS — Z87891 Personal history of nicotine dependence: Secondary | ICD-10-CM | POA: Diagnosis not present

## 2022-11-18 DIAGNOSIS — K50012 Crohn's disease of small intestine with intestinal obstruction: Secondary | ICD-10-CM | POA: Diagnosis not present

## 2022-11-18 DIAGNOSIS — E8809 Other disorders of plasma-protein metabolism, not elsewhere classified: Secondary | ICD-10-CM | POA: Diagnosis present

## 2022-11-18 DIAGNOSIS — K2289 Other specified disease of esophagus: Secondary | ICD-10-CM | POA: Diagnosis not present

## 2022-11-18 DIAGNOSIS — K50819 Crohn's disease of both small and large intestine with unspecified complications: Secondary | ICD-10-CM | POA: Diagnosis present

## 2022-11-18 DIAGNOSIS — K562 Volvulus: Secondary | ICD-10-CM | POA: Diagnosis not present

## 2022-11-18 DIAGNOSIS — K5010 Crohn's disease of large intestine without complications: Secondary | ICD-10-CM | POA: Diagnosis not present

## 2022-11-18 DIAGNOSIS — Z9882 Breast implant status: Secondary | ICD-10-CM

## 2022-11-18 DIAGNOSIS — Z8719 Personal history of other diseases of the digestive system: Secondary | ICD-10-CM | POA: Diagnosis not present

## 2022-11-18 DIAGNOSIS — I469 Cardiac arrest, cause unspecified: Secondary | ICD-10-CM | POA: Diagnosis not present

## 2022-11-18 DIAGNOSIS — R630 Anorexia: Secondary | ICD-10-CM

## 2022-11-18 DIAGNOSIS — D72829 Elevated white blood cell count, unspecified: Secondary | ICD-10-CM | POA: Diagnosis not present

## 2022-11-18 DIAGNOSIS — Z9013 Acquired absence of bilateral breasts and nipples: Secondary | ICD-10-CM | POA: Diagnosis not present

## 2022-11-18 DIAGNOSIS — K641 Second degree hemorrhoids: Secondary | ICD-10-CM | POA: Diagnosis present

## 2022-11-18 DIAGNOSIS — Z681 Body mass index (BMI) 19 or less, adult: Secondary | ICD-10-CM | POA: Diagnosis not present

## 2022-11-18 DIAGNOSIS — R933 Abnormal findings on diagnostic imaging of other parts of digestive tract: Secondary | ICD-10-CM | POA: Diagnosis not present

## 2022-11-18 DIAGNOSIS — Z9221 Personal history of antineoplastic chemotherapy: Secondary | ICD-10-CM | POA: Diagnosis not present

## 2022-11-18 DIAGNOSIS — R110 Nausea: Secondary | ICD-10-CM | POA: Diagnosis not present

## 2022-11-18 DIAGNOSIS — E440 Moderate protein-calorie malnutrition: Secondary | ICD-10-CM | POA: Diagnosis present

## 2022-11-18 DIAGNOSIS — K6289 Other specified diseases of anus and rectum: Secondary | ICD-10-CM | POA: Diagnosis not present

## 2022-11-18 DIAGNOSIS — K56609 Unspecified intestinal obstruction, unspecified as to partial versus complete obstruction: Secondary | ICD-10-CM | POA: Diagnosis not present

## 2022-11-18 DIAGNOSIS — Z888 Allergy status to other drugs, medicaments and biological substances status: Secondary | ICD-10-CM

## 2022-11-18 DIAGNOSIS — Z886 Allergy status to analgesic agent status: Secondary | ICD-10-CM

## 2022-11-18 DIAGNOSIS — E876 Hypokalemia: Secondary | ICD-10-CM | POA: Diagnosis not present

## 2022-11-18 DIAGNOSIS — Z853 Personal history of malignant neoplasm of breast: Secondary | ICD-10-CM

## 2022-11-18 DIAGNOSIS — E861 Hypovolemia: Secondary | ICD-10-CM | POA: Diagnosis present

## 2022-11-18 DIAGNOSIS — E860 Dehydration: Secondary | ICD-10-CM | POA: Diagnosis present

## 2022-11-18 DIAGNOSIS — K559 Vascular disorder of intestine, unspecified: Secondary | ICD-10-CM | POA: Diagnosis not present

## 2022-11-18 DIAGNOSIS — D509 Iron deficiency anemia, unspecified: Secondary | ICD-10-CM | POA: Diagnosis present

## 2022-11-18 DIAGNOSIS — R509 Fever, unspecified: Secondary | ICD-10-CM | POA: Diagnosis not present

## 2022-11-18 DIAGNOSIS — Z9049 Acquired absence of other specified parts of digestive tract: Secondary | ICD-10-CM | POA: Diagnosis not present

## 2022-11-18 DIAGNOSIS — B370 Candidal stomatitis: Secondary | ICD-10-CM | POA: Diagnosis present

## 2022-11-18 DIAGNOSIS — I472 Ventricular tachycardia, unspecified: Secondary | ICD-10-CM | POA: Diagnosis not present

## 2022-11-18 DIAGNOSIS — I498 Other specified cardiac arrhythmias: Secondary | ICD-10-CM | POA: Diagnosis not present

## 2022-11-18 DIAGNOSIS — Z923 Personal history of irradiation: Secondary | ICD-10-CM

## 2022-11-18 DIAGNOSIS — D259 Leiomyoma of uterus, unspecified: Secondary | ICD-10-CM | POA: Diagnosis present

## 2022-11-18 DIAGNOSIS — D638 Anemia in other chronic diseases classified elsewhere: Secondary | ICD-10-CM | POA: Diagnosis present

## 2022-11-18 DIAGNOSIS — R000 Tachycardia, unspecified: Secondary | ICD-10-CM | POA: Diagnosis present

## 2022-11-18 DIAGNOSIS — E871 Hypo-osmolality and hyponatremia: Secondary | ICD-10-CM | POA: Diagnosis present

## 2022-11-18 DIAGNOSIS — R627 Adult failure to thrive: Secondary | ICD-10-CM | POA: Diagnosis present

## 2022-11-18 DIAGNOSIS — Z803 Family history of malignant neoplasm of breast: Secondary | ICD-10-CM

## 2022-11-18 DIAGNOSIS — K5080 Crohn's disease of both small and large intestine without complications: Secondary | ICD-10-CM | POA: Diagnosis present

## 2022-11-18 DIAGNOSIS — K3189 Other diseases of stomach and duodenum: Secondary | ICD-10-CM | POA: Diagnosis not present

## 2022-11-18 DIAGNOSIS — A419 Sepsis, unspecified organism: Secondary | ICD-10-CM | POA: Diagnosis not present

## 2022-11-18 DIAGNOSIS — K644 Residual hemorrhoidal skin tags: Secondary | ICD-10-CM | POA: Diagnosis present

## 2022-11-18 DIAGNOSIS — K56699 Other intestinal obstruction unspecified as to partial versus complete obstruction: Secondary | ICD-10-CM

## 2022-11-18 DIAGNOSIS — R109 Unspecified abdominal pain: Secondary | ICD-10-CM | POA: Diagnosis not present

## 2022-11-18 DIAGNOSIS — K629 Disease of anus and rectum, unspecified: Secondary | ICD-10-CM | POA: Diagnosis not present

## 2022-11-18 DIAGNOSIS — D649 Anemia, unspecified: Secondary | ICD-10-CM | POA: Diagnosis not present

## 2022-11-18 DIAGNOSIS — K5669 Other partial intestinal obstruction: Principal | ICD-10-CM | POA: Diagnosis present

## 2022-11-18 DIAGNOSIS — E43 Unspecified severe protein-calorie malnutrition: Secondary | ICD-10-CM | POA: Insufficient documentation

## 2022-11-18 DIAGNOSIS — K90829 Short bowel syndrome, unspecified: Secondary | ICD-10-CM | POA: Diagnosis not present

## 2022-11-18 DIAGNOSIS — R634 Abnormal weight loss: Secondary | ICD-10-CM | POA: Diagnosis present

## 2022-11-18 DIAGNOSIS — I2699 Other pulmonary embolism without acute cor pulmonale: Secondary | ICD-10-CM | POA: Diagnosis not present

## 2022-11-18 DIAGNOSIS — M79A3 Nontraumatic compartment syndrome of abdomen: Secondary | ICD-10-CM | POA: Diagnosis not present

## 2022-11-18 DIAGNOSIS — R6521 Severe sepsis with septic shock: Secondary | ICD-10-CM | POA: Diagnosis not present

## 2022-11-18 DIAGNOSIS — Z79899 Other long term (current) drug therapy: Secondary | ICD-10-CM

## 2022-11-18 LAB — CBC WITH DIFFERENTIAL/PLATELET
Abs Immature Granulocytes: 0.05 10*3/uL (ref 0.00–0.07)
Basophils Absolute: 0 10*3/uL (ref 0.0–0.1)
Basophils Relative: 1 %
Eosinophils Absolute: 0 10*3/uL (ref 0.0–0.5)
Eosinophils Relative: 0 %
HCT: 30.6 % — ABNORMAL LOW (ref 36.0–46.0)
Hemoglobin: 10.1 g/dL — ABNORMAL LOW (ref 12.0–15.0)
Immature Granulocytes: 1 %
Lymphocytes Relative: 19 %
Lymphs Abs: 1.7 10*3/uL (ref 0.7–4.0)
MCH: 25.3 pg — ABNORMAL LOW (ref 26.0–34.0)
MCHC: 33 g/dL (ref 30.0–36.0)
MCV: 76.7 fL — ABNORMAL LOW (ref 80.0–100.0)
Monocytes Absolute: 1 10*3/uL (ref 0.1–1.0)
Monocytes Relative: 12 %
Neutro Abs: 6 10*3/uL (ref 1.7–7.7)
Neutrophils Relative %: 67 %
Platelets: 391 10*3/uL (ref 150–400)
RBC: 3.99 MIL/uL (ref 3.87–5.11)
RDW: 15.9 % — ABNORMAL HIGH (ref 11.5–15.5)
WBC: 8.8 10*3/uL (ref 4.0–10.5)
nRBC: 0 % (ref 0.0–0.2)

## 2022-11-18 LAB — COMPREHENSIVE METABOLIC PANEL
ALT: 6 U/L (ref 0–44)
AST: 9 U/L — ABNORMAL LOW (ref 15–41)
Albumin: 2.9 g/dL — ABNORMAL LOW (ref 3.5–5.0)
Alkaline Phosphatase: 106 U/L (ref 38–126)
Anion gap: 11 (ref 5–15)
BUN: 10 mg/dL (ref 6–20)
CO2: 25 mmol/L (ref 22–32)
Calcium: 8.4 mg/dL — ABNORMAL LOW (ref 8.9–10.3)
Chloride: 101 mmol/L (ref 98–111)
Creatinine, Ser: 0.61 mg/dL (ref 0.44–1.00)
GFR, Estimated: >60 mL/min (ref >60)
Glucose, Bld: 88 mg/dL (ref 70–99)
Potassium: 3.2 mmol/L — ABNORMAL LOW (ref 3.5–5.1)
Sodium: 137 mmol/L (ref 135–145)
Total Bilirubin: 0.6 mg/dL (ref 0.3–1.2)
Total Protein: 5.9 g/dL — ABNORMAL LOW (ref 6.5–8.1)

## 2022-11-18 LAB — LACTIC ACID, PLASMA: Lactic Acid, Venous: 0.6 mmol/L (ref 0.5–1.9)

## 2022-11-18 LAB — LIPASE, BLOOD: Lipase: 11 U/L (ref 11–51)

## 2022-11-18 MED ORDER — SODIUM CHLORIDE 0.9 % IV BOLUS
1000.0000 mL | Freq: Once | INTRAVENOUS | Status: AC
  Administered 2022-11-18: 1000 mL via INTRAVENOUS

## 2022-11-18 MED ORDER — ONDANSETRON HCL 4 MG/2ML IJ SOLN
4.0000 mg | Freq: Once | INTRAMUSCULAR | Status: DC
  Filled 2022-11-18: qty 2

## 2022-11-18 MED ORDER — IOHEXOL 300 MG/ML  SOLN
100.0000 mL | Freq: Once | INTRAMUSCULAR | Status: AC | PRN
  Administered 2022-11-18: 100 mL via INTRAVENOUS

## 2022-11-18 NOTE — ED Notes (Signed)
ED TO INPATIENT HANDOFF REPORT  ED Nurse Name and Phone #: [phone] [name] Name/Age/Gender [name] 47 y.o. female Room/Bed: DB001/DB001  Code Status   Code Status: Prior  Home/SNF/Other Home Patient oriented to: self, place, time, and situation Is this baseline? Yes   Triage Complete: Triage complete  Chief Complaint SBO (small bowel obstruction) (HCC) [K56.609]  Triage Note Pt presents with c/o of not wanting to eat or drink anything. Last chemotherapy was 6 weeks ago for breast cancer. Pt reports significant weight loss.    Allergies Allergies  Allergen Reactions   Nsaids Other (See Comments)    Crohns disease/ IBD   Other     BLOOD PRODUCT REFUSAL    Level of Care/Admitting Diagnosis ED Disposition     ED Disposition  Admit   Condition  --   Comment  Hospital Area: [facility] [[id number]]  Level of Care: Telemetry Medical [104]  May admit patient to [facility] or [facility] if equivalent level of care is available:: Yes  Interfacility transfer: Yes  Covid Evaluation: Asymptomatic - no recent exposure (last 10 days) testing not required  Diagnosis: SBO (small bowel obstruction) [facility]) [[id number]]  Admitting Physician: Angie Fava [[id number]]  Attending Physician: Angie Fava [phone]  Certification:: I certify this patient will need inpatient services for at least 2 midnights  Estimated Length of Stay: 2          B Medical/Surgery History Past Medical History:  Diagnosis Date   Cancer (HCC)    Colon stricture (HCC) 05/07/2019   Crohn's colitis, with intestinal obstruction (HCC) 09/17/2013   Dx 2006 - right and left colon involved 2011 - pan-colitis 09/17/2013 left colitis 60-20 cm March 2021 subtotal colectomy ileosigmoid anastomosis for colonic strictures-Dr. Maisie Fus     Crohn's disease [facility])    Family history of breast cancer 06/11/2020   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  10/17/2010   Rectal bleeding    Uterine fibroid    Vitamin D deficiency 09/24/2013   Past Surgical History:  Procedure Laterality Date   AXILLARY LYMPH NODE DISSECTION Right 11/09/2020   Procedure: RIGHT AXILLARY LYMPH NODE DISSECTION;  Surgeon: Emelia Loron, MD;  Location: [facility];  Service: General;  Laterality: Right;   BIOPSY  07/01/2020   Procedure: BIOPSY;  Surgeon: Benancio Deeds, MD;  Location: [facility];  Service: Gastroenterology;;   BIOPSY  01/27/2021   Procedure: BIOPSY;  Surgeon: Benancio Deeds, MD;  Location: [facility];  Service: Gastroenterology;;   BIOPSY  09/07/2021   Procedure: BIOPSY;  Surgeon: Lynann Bologna, MD;  Location: [facility];  Service: Gastroenterology;;   BREAST BIOPSY Right 03/24/2022   [place] RT BREAST BX W LOC DEV 1ST LESION IMG BX SPEC US GUIDE 03/24/2022 [facility]   BREAST BIOPSY  04/28/2022   [place] RT RADIOACTIVE SEED LOC 04/28/2022 [facility]   BREAST IMPLANT REMOVAL Right 08/07/2022   Procedure: REMOVAL  OF RIGHT BREAST IMPLANTS;  Surgeon: Allena Napoleon, MD;  Location: [facility];  Service: Plastics;  Laterality: Right;   BREAST RECONSTRUCTION WITH PLACEMENT OF TISSUE EXPANDER AND FLEX HD (ACELLULAR HYDRATED DERMIS) Bilateral 11/09/2020   Procedure: BILATERAL BREAST RECONSTRUCTION WITH PLACEMENT OF TISSUE EXPANDER AND FLEX HD (ACELLULAR HYDRATED DERMIS);  Surgeon: Allena Napoleon, MD;  Location: [facility];  Service: Plastics;  Laterality: Bilateral;   CAPSULOTOMY Bilateral 07/25/2021   Procedure: CAPSULOTOMY;  Surgeon: Arita Miss,  Wendy Poet, MD;  Location: [facility];  Service: Plastics;  Laterality: Bilateral;   COLECTOMY  06/27/2019   COLONOSCOPY  2015   FLEXIBLE SIGMOIDOSCOPY N/A 07/01/2020   Procedure: FLEXIBLE SIGMOIDOSCOPY;  Surgeon: Benancio Deeds, MD;  Location: [facility];  Service: Gastroenterology;  Laterality: N/A;   FLEXIBLE SIGMOIDOSCOPY N/A  01/27/2021   Procedure: FLEXIBLE SIGMOIDOSCOPY;  Surgeon: Benancio Deeds, MD;  Location: [facility];  Service: Gastroenterology;  Laterality: N/A;   FLEXIBLE SIGMOIDOSCOPY N/A 09/07/2021   Procedure: FLEXIBLE SIGMOIDOSCOPY;  Surgeon: Lynann Bologna, MD;  Location: [facility];  Service: Gastroenterology;  Laterality: N/A;   NIPPLE SPARING MASTECTOMY Bilateral 11/09/2020   Procedure: BILATERAL NIPPLE SPARING MASTECTOMY;  Surgeon: Emelia Loron, MD;  Location: [facility];  Service: General;  Laterality: Bilateral;   PORT-A-CATH REMOVAL N/A 07/25/2021   Procedure: REMOVAL PORT-A-CATH;  Surgeon: Allena Napoleon, MD;  Location: [facility];  Service: Plastics;  Laterality: N/A;   PORTACATH PLACEMENT N/A 06/23/2020   Procedure: INSERTION PORT-A-CATH;  Surgeon: Emelia Loron, MD;  Location: [facility];  Service: General;  Laterality: N/A;  START TIME OF 11:00 AM FOR 60 MINUTES [place] [facility]   PORTACATH PLACEMENT Left 05/02/2022   Procedure: INSERTION PORT-A-CATH;  Surgeon: Emelia Loron, MD;  Location: [facility];  Service: General;  Laterality: Left;   RADIOACTIVE SEED GUIDED EXCISIONAL BREAST BIOPSY Right 05/02/2022   Procedure: RADIOACTIVE SEED GUIDED RIGHT BREAST MASS EXCISION;  Surgeon: Emelia Loron, MD;  Location: [facility];  Service: General;  Laterality: Right;   REMOVAL OF BILATERAL TISSUE EXPANDERS WITH PLACEMENT OF BILATERAL BREAST IMPLANTS Bilateral 07/25/2021   Procedure: REMOVAL OF BILATERAL TISSUE EXPANDERS WITH PLACEMENT OF BILATERAL BREAST IMPLANTS;  Surgeon: Allena Napoleon, MD;  Location: [facility];  Service: Plastics;  Laterality: Bilateral;  1.5     A IV Location/Drains/Wounds Patient Lines/Drains/Airways Status     Active Line/Drains/Airways     Name Placement date Placement time Site Days   Implanted Port 05/02/22 Left Chest 05/02/22  1209  Chest  200             Intake/Output Last 24 hours  Intake/Output Summary (Last 24 hours) at 11/18/2022 2113 Last data filed at 11/18/2022 1933 Gross per 24 hour  Intake 1006.06 ml  Output --  Net 1006.06 ml    Labs/Imaging Results for orders placed or performed during the hospital encounter of 11/18/22 (from the past 48 hour(s))  CBC with Differential     Status: Abnormal   Collection Time: 11/18/22  5:07 PM  Result Value Ref Range   WBC 8.8 4.0 - 10.5 K/uL   RBC 3.99 3.87 - 5.11 MIL/uL   Hemoglobin 10.1 (L) 12.0 - 15.0 g/dL   HCT 57.8 (L) 46.9 - 62.9 %   MCV 76.7 (L) 80.0 - 100.0 fL   MCH 25.3 (L) 26.0 - 34.0 pg   MCHC 33.0 30.0 - 36.0 g/dL   RDW 52.8 (H) 41.3 - 24.4 %   Platelets 391 150 - 400 K/uL   nRBC 0.0 0.0 - 0.2 %   Neutrophils Relative % 67 %   Neutro Abs 6.0 1.7 - 7.7 K/uL   Lymphocytes Relative 19 %   Lymphs Abs 1.7 0.7 - 4.0 K/uL   Monocytes Relative 12 %   Monocytes Absolute 1.0 0.1 - 1.0 K/uL   Eosinophils Relative 0 %   Eosinophils Absolute 0.0 0.0 - 0.5 K/uL   Basophils Relative  1 %   Basophils Absolute 0.0 0.0 - 0.1 K/uL   Immature Granulocytes 1 %   Abs Immature Granulocytes 0.05 0.00 - 0.07 K/uL    Comment: Performed at [facility], [address] [place], [place] [address]  Comprehensive metabolic panel     Status: Abnormal   Collection Time: 11/18/22  5:07 PM  Result Value Ref Range   Sodium 137 135 - 145 mmol/L   Potassium 3.2 (L) 3.5 - 5.1 mmol/L   Chloride 101 98 - 111 mmol/L   CO2 25 22 - 32 mmol/L   Glucose, Bld 88 70 - 99 mg/dL    Comment: Glucose reference range applies only to samples taken after fasting for at least 8 hours.   BUN 10 6 - 20 mg/dL   Creatinine, Ser 2.53 0.44 - 1.00 mg/dL   Calcium 8.4 (L) 8.9 - 10.3 mg/dL   Total Protein 5.9 (L) 6.5 - 8.1 g/dL   Albumin 2.9 (L) 3.5 - 5.0 g/dL   AST 9 (L) 15 - 41 U/L   ALT 6 0 - 44 U/L   Alkaline Phosphatase 106 38 - 126 U/L   Total Bilirubin 0.6 0.3 - 1.2 mg/dL   GFR, Estimated  >66 >44 mL/min    Comment: (NOTE) Calculated using the CKD-EPI Creatinine Equation (2021)    Anion gap 11 5 - 15    Comment: Performed at [facility], [address] [place], [place] [address]  Lipase, blood     Status: None   Collection Time: 11/18/22  5:07 PM  Result Value Ref Range   Lipase 11 11 - 51 U/L    Comment: Performed at [facility], [address] [place], [place] [address]  Lactic acid, plasma     Status: None   Collection Time: 11/18/22  5:07 PM  Result Value Ref Range   Lactic Acid, Venous 0.6 0.5 - 1.9 mmol/L    Comment: Performed at [facility], [address] [place], [place] [address]   CT ABDOMEN PELVIS W CONTRAST  Result Date: 11/18/2022 CLINICAL DATA:  History of breast cancer on chemotherapy with loss of appetite. * Tracking Code: BO * EXAM: CT ANGIOGRAPHY CHEST CT ABDOMEN AND PELVIS WITH CONTRAST TECHNIQUE: Multidetector CT imaging of the chest was performed using the standard protocol during bolus administration of intravenous contrast. Multiplanar CT image reconstructions and MIPs were obtained to evaluate the vascular anatomy. Multidetector CT imaging of the abdomen and pelvis was performed using the standard protocol during bolus administration of intravenous contrast. RADIATION DOSE REDUCTION: This exam was performed according to the departmental dose-optimization program which includes automated exposure control, adjustment of the mA and/or kV according to patient size and/or use of iterative reconstruction technique. CONTRAST:  OMNIPAQUE IOHEXOL 300 MG/ML  SOLN COMPARISON:  CT abdomen and pelvis dated 09/27/2022, CT chest dated 04/06/2022 FINDINGS: CTA CHEST FINDINGS Cardiovascular: Left chest wall port terminates in the right atrium. The study is high quality for the evaluation of pulmonary embolism. There are no filling defects in the central, lobar, segmental or subsegmental pulmonary artery  branches to suggest acute pulmonary embolism. Anatomic variant common origin of the brachiocephalic and common carotid arteries. Normal heart size. No significant pericardial fluid/thickening. Mediastinum/Nodes: Imaged thyroid gland without nodules meeting criteria for imaging follow-up by size. Normal esophagus. No pathologically enlarged axillary, supraclavicular, mediastinal, or hilar lymph nodes. Lungs/Pleura: The central airways are patent. Unchanged subpleural ground-glass densities along the anterior right upper and middle lobe.  Unchanged small area of perifissural ground-glass opacity is also seen in the anterior right lower lobe. Unchanged 3 mm basilar left lower lobe nodule (6:88). No pneumothorax. No pleural effusion. Musculoskeletal: No acute or abnormal lytic or blastic osseous lesions. Right mastectomy. Left breast implant. Review of the MIP images confirms the above findings. CT ABDOMEN and PELVIS FINDINGS Hepatobiliary: Subcentimeter hypoattenuating focus in segment 5 (2:23) too small to characterize but unchanged. No intra or extrahepatic biliary ductal dilation. Normal gallbladder. Pancreas: No focal lesions or main ductal dilation. Spleen: Normal in size without focal abnormality. Adrenals/Urinary Tract: No adrenal nodules. No suspicious renal mass, calculi, or hydronephrosis. No focal bladder wall thickening. Stomach/Bowel: Normal appearance of the stomach. Status post subtotal colectomy with ileosigmoid anastomosis. Marked long segment small bowel dilation demonstrating mild mural thickening, similar to 09/27/2022, with abrupt caliber transition near the ileosigmoid anastomosis (5:51). Vascular/Lymphatic: No significant vascular findings are present. No enlarged abdominal or pelvic lymph nodes. Reproductive: Enlarged and lobulated uterus containing multifocal enhancing masses, including a few with coarse calcifications. No adnexal masses. Other: No free fluid, fluid collection, or free air.  Musculoskeletal: No acute or abnormal lytic or blastic osseous lesions. IMPRESSION: 1. No evidence of pulmonary embolism. 2. Status post subtotal colectomy with marked long segment small bowel dilation demonstrating mild mural thickening, similar to 09/27/2022, with abrupt caliber transition near the ileosigmoid anastomosis, suspicious for small-bowel obstruction. 3. Unchanged subpleural ground-glass densities along the anterior right upper and middle lobe, likely postradiation change. 4. 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 5. Unchanged 3 mm basilar left lower lobe nodule. Electronically Signed   By: Agustin Cree M.D.   On: 11/18/2022 18:33   CT Angio Chest PE W/Cm &/Or Wo Cm  Result Date: 11/18/2022 CLINICAL DATA:  History of breast cancer on chemotherapy with loss of appetite. * Tracking Code: BO * EXAM: CT ANGIOGRAPHY CHEST CT ABDOMEN AND PELVIS WITH CONTRAST TECHNIQUE: Multidetector CT imaging of the chest was performed using the standard protocol during bolus administration of intravenous contrast. Multiplanar CT image reconstructions and MIPs were obtained to evaluate the vascular anatomy. Multidetector CT imaging of the abdomen and pelvis was performed using the standard protocol during bolus administration of intravenous contrast. RADIATION DOSE REDUCTION: This exam was performed according to the departmental dose-optimization program which includes automated exposure control, adjustment of the mA and/or kV according to patient size and/or use of iterative reconstruction technique. CONTRAST:  OMNIPAQUE IOHEXOL 300 MG/ML  SOLN COMPARISON:  CT abdomen and pelvis dated 09/27/2022, CT chest dated 04/06/2022 FINDINGS: CTA CHEST FINDINGS Cardiovascular: Left chest wall port terminates in the right atrium. The study is high quality for the evaluation of pulmonary embolism. There are no filling defects  in the central, lobar, segmental or subsegmental pulmonary artery branches to suggest acute pulmonary embolism. Anatomic variant common origin of the brachiocephalic and common carotid arteries. Normal heart size. No significant pericardial fluid/thickening. Mediastinum/Nodes: Imaged thyroid gland without nodules meeting criteria for imaging follow-up by size. Normal esophagus. No pathologically enlarged axillary, supraclavicular, mediastinal, or hilar lymph nodes. Lungs/Pleura: The central airways are patent. Unchanged subpleural ground-glass densities along the anterior right upper and middle lobe. Unchanged small area of perifissural ground-glass opacity is also seen in the anterior right lower lobe. Unchanged 3 mm basilar left lower lobe nodule (6:88). No pneumothorax. No pleural effusion. Musculoskeletal: No acute or abnormal lytic or blastic osseous lesions. Right mastectomy. Left breast implant. Review of the  MIP images confirms the above findings. CT ABDOMEN and PELVIS FINDINGS Hepatobiliary: Subcentimeter hypoattenuating focus in segment 5 (2:23) too small to characterize but unchanged. No intra or extrahepatic biliary ductal dilation. Normal gallbladder. Pancreas: No focal lesions or main ductal dilation. Spleen: Normal in size without focal abnormality. Adrenals/Urinary Tract: No adrenal nodules. No suspicious renal mass, calculi, or hydronephrosis. No focal bladder wall thickening. Stomach/Bowel: Normal appearance of the stomach. Status post subtotal colectomy with ileosigmoid anastomosis. Marked long segment small bowel dilation demonstrating mild mural thickening, similar to 09/27/2022, with abrupt caliber transition near the ileosigmoid anastomosis (5:51). Vascular/Lymphatic: No significant vascular findings are present. No enlarged abdominal or pelvic lymph nodes. Reproductive: Enlarged and lobulated uterus containing multifocal enhancing masses, including a few with coarse calcifications. No adnexal  masses. Other: No free fluid, fluid collection, or free air. Musculoskeletal: No acute or abnormal lytic or blastic osseous lesions. IMPRESSION: 1. No evidence of pulmonary embolism. 2. Status post subtotal colectomy with marked long segment small bowel dilation demonstrating mild mural thickening, similar to 09/27/2022, with abrupt caliber transition near the ileosigmoid anastomosis, suspicious for small-bowel obstruction. 3. Unchanged subpleural ground-glass densities along the anterior right upper and middle lobe, likely postradiation change. 4. 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 5. Unchanged 3 mm basilar left lower lobe nodule. Electronically Signed   By: Agustin Cree M.D.   On: 11/18/2022 18:33    Pending Labs Unresulted Labs (From admission, onward)     Start     Ordered   11/18/22 1701  Culture, blood (routine x 2)  BLOOD CULTURE X 2,   R (with STAT occurrences)      11/18/22 1700   11/18/22 1627  Urinalysis, Routine w reflex microscopic -Urine, Clean Catch  (ED Abdominal Pain)  Once,   URGENT       Question:  Specimen Source  Answer:  Urine, Clean Catch   11/18/22 1627   11/18/22 1627  Pregnancy, urine  (ED Abdominal Pain)  Once,   URGENT        11/18/22 1627            Vitals/Pain Today's Vitals   11/18/22 1550 11/18/22 1551 11/18/22 1712 11/18/22 1935  BP: (!) 122/97  118/86   Pulse: (!) 141  (!) 118   Resp: 18  20   Temp: 98.7 F (37.1 C)   98.7 F (37.1 C)  TempSrc: Temporal   Oral  SpO2: 98%  100%   Weight:  49.9 kg    Height:  5\' 4"  (1.626 m)    PainSc:  3       Isolation Precautions No active isolations  Medications Medications  ondansetron (ZOFRAN) injection 4 mg (4 mg Intravenous Not Given 11/18/22 1729)  sodium chloride 0.9 % bolus 1,000 mL ( Intravenous Stopped 11/18/22 1915)  iohexol (OMNIPAQUE) 300 MG/ML solution 100 mL (100 mLs Intravenous Contrast  Given 11/18/22 1748)  sodium chloride 0.9 % bolus 1,000 mL (1,000 mLs Intravenous New Bag/Given 11/18/22 2023)    Mobility walks     Focused Assessments Cardiac Assessment Handoff:    No results found for: "CKTOTAL", "CKMB", "CKMBINDEX", "TROPONINI" No results found for: "DDIMER" Does the Patient currently have chest pain? No    R Recommendations: See Admitting Provider Note  Report given to:   Additional Notes:  last chemo 6 wks ago, ambulatory

## 2022-11-18 NOTE — Progress Notes (Signed)
Hospitalist Transfer Note:    Nursing staff, Please call [facility] Admits & Consults System-Wide number on Amion [phone]) as soon as patient's arrival, so appropriate admitting provider can evaluate the pt.  Transferring facility: [facility] Requesting provider: Riki Sheer, PA (EDP at [facility]) Reason for transfer: admission for further evaluation and management of small bowel obstruction.    47 year old female with reported past medical history of breast cancer, reported to be now in remission, small bowel obstruction in June 2024, who presented to [facility] ED complaining of 1 to 2 weeks of decline in oral intake as a consequence of interval decline in appetite over that timeframe.  In the setting of this significant line in oral intake over the last 1 to 2 weeks, she notes an unquantified weight loss over that timeframe.  She also notes a significant decline in stool output over the last few days, with decline in flatus production.  Afebrile.  While she denies any acute abdominal discomfort nor any nausea/vomiting, she notes a similar presentation in June 2024, at which time she was diagnosed with small bowel obstruction.   Vital signs in the ED were notable for the following: Afebrile; sinus tachycardia, that is improving with IV fluids.  Labs were notable for lactic acid 0.6.  No leukocytosis  Imaging notable for CT abdomen/pelvis which was reported to be suggestive of small bowel obstruction.   EDP has contacted the on-call general surgeon, requesting consultation for small bowel obstruction.    Subsequently, I accepted this patient for transfer for inpatient admission to a med-tele bed at [facility] or [facility] (first available) for further work-up and management of the above. It is noted that there are not currently any beds available at [facility] this evening.     Newton Pigg, DO Hospitalist

## 2022-11-18 NOTE — ED Triage Notes (Signed)
Pt presents with c/o of not wanting to eat or drink anything. Last chemotherapy was 6 weeks ago for breast cancer. Pt reports significant weight loss.

## 2022-11-18 NOTE — ED Provider Notes (Signed)
[place] [facility] AT [facility] Provider Note   CSN: [id number] Arrival date & time: 11/18/22  1540     History {Add pertinent medical, surgical, social history, OB history to HPI:1} Chief Complaint  Patient presents with  . Weight Loss   HPI [name] is a 47 y.o. female with history of breast cancer, Crohn's disease, and recent SBO presenting for poor appetite and weight loss.  States she is currently in remission with her breast cancer and received her last chemotherapy 6 weeks ago.  Since then reporting significant weight loss.  Also reporting a poor appetite.  States she hardly has anything to eat or drink for the last 2 to 3 weeks.  Denies abdominal pain denies nausea vomiting diarrhea.  Her last bowel movement was small and stool was "pebble-like".  Denies chest pain, shortness of breath and calf tenderness.  Denies fever at home.  Denies cough congestion.  HPI     Home Medications Prior to Admission medications   Medication Sig Start Date End Date Taking? Authorizing Provider  acetaminophen (TYLENOL) 500 MG tablet Take 500-1,000 mg by mouth every 6 (six) hours as needed for mild pain.    [provider]  Multiple Vitamins-Minerals (MULTIVITAMIN WITH MINERALS) tablet Take 1 tablet by mouth daily.    [provider]  potassium chloride SA (KLOR-CON M) 20 MEQ tablet Take 1 tablet (20 mEq total) by mouth daily. 12/27/21   Iva Boop, MD      Allergies    Nsaids and Other    Review of Systems   See HPI for pertinent positives  Physical Exam Updated Vital Signs BP 118/86   Pulse (!) 118   Temp 98.7 F (37.1 C) (Oral)   Resp 20   Ht 5\' 4"  (1.626 m)   Wt 49.9 kg   LMP 04/24/2021   SpO2 100%   BMI 18.88 kg/m  Physical Exam Vitals and nursing note reviewed.  HENT:     Head: Normocephalic and atraumatic.     Mouth/Throat:     Mouth: Mucous membranes are moist.  Eyes:     General:        Right eye: No discharge.         Left eye: No discharge.     Conjunctiva/sclera: Conjunctivae normal.  Cardiovascular:     Rate and Rhythm: Normal rate and regular rhythm.     Pulses: Normal pulses.     Heart sounds: Normal heart sounds.  Pulmonary:     Effort: Pulmonary effort is normal.     Breath sounds: Normal breath sounds.  Abdominal:     General: Abdomen is flat. There is no distension.     Palpations: Abdomen is soft.     Tenderness: There is no abdominal tenderness.  Skin:    General: Skin is warm and dry.  Neurological:     General: No focal deficit present.  Psychiatric:        Mood and Affect: Mood normal.    ED Results / Procedures / Treatments   Labs (all labs ordered are listed, but only abnormal results are displayed) Labs Reviewed  CBC WITH DIFFERENTIAL/PLATELET - Abnormal; Notable for the following components:      Result Value   Hemoglobin 10.1 (*)    HCT 30.6 (*)    MCV 76.7 (*)    MCH 25.3 (*)    RDW 15.9 (*)    All other components within normal limits  COMPREHENSIVE METABOLIC PANEL -  Abnormal; Notable for the following components:   Potassium 3.2 (*)    Calcium 8.4 (*)    Total Protein 5.9 (*)    Albumin 2.9 (*)    AST 9 (*)    All other components within normal limits  CULTURE, BLOOD (ROUTINE X 2)  CULTURE, BLOOD (ROUTINE X 2)  LIPASE, BLOOD  LACTIC ACID, PLASMA  URINALYSIS, ROUTINE W REFLEX MICROSCOPIC  PREGNANCY, URINE    EKG None  Radiology CT ABDOMEN PELVIS W CONTRAST  Result Date: 11/18/2022 CLINICAL DATA:  History of breast cancer on chemotherapy with loss of appetite. * Tracking Code: BO * EXAM: CT ANGIOGRAPHY CHEST CT ABDOMEN AND PELVIS WITH CONTRAST TECHNIQUE: Multidetector CT imaging of the chest was performed using the standard protocol during bolus administration of intravenous contrast. Multiplanar CT image reconstructions and MIPs were obtained to evaluate the vascular anatomy. Multidetector CT imaging of the abdomen and pelvis was performed using the  standard protocol during bolus administration of intravenous contrast. RADIATION DOSE REDUCTION: This exam was performed according to the departmental dose-optimization program which includes automated exposure control, adjustment of the mA and/or kV according to patient size and/or use of iterative reconstruction technique. CONTRAST:  OMNIPAQUE IOHEXOL 300 MG/ML  SOLN COMPARISON:  CT abdomen and pelvis dated 09/27/2022, CT chest dated 04/06/2022 FINDINGS: CTA CHEST FINDINGS Cardiovascular: Left chest wall port terminates in the right atrium. The study is high quality for the evaluation of pulmonary embolism. There are no filling defects in the central, lobar, segmental or subsegmental pulmonary artery branches to suggest acute pulmonary embolism. Anatomic variant common origin of the brachiocephalic and common carotid arteries. Normal heart size. No significant pericardial fluid/thickening. Mediastinum/Nodes: Imaged thyroid gland without nodules meeting criteria for imaging follow-up by size. Normal esophagus. No pathologically enlarged axillary, supraclavicular, mediastinal, or hilar lymph nodes. Lungs/Pleura: The central airways are patent. Unchanged subpleural ground-glass densities along the anterior right upper and middle lobe. Unchanged small area of perifissural ground-glass opacity is also seen in the anterior right lower lobe. Unchanged 3 mm basilar left lower lobe nodule (6:88). No pneumothorax. No pleural effusion. Musculoskeletal: No acute or abnormal lytic or blastic osseous lesions. Right mastectomy. Left breast implant. Review of the MIP images confirms the above findings. CT ABDOMEN and PELVIS FINDINGS Hepatobiliary: Subcentimeter hypoattenuating focus in segment 5 (2:23) too small to characterize but unchanged. No intra or extrahepatic biliary ductal dilation. Normal gallbladder. Pancreas: No focal lesions or main ductal dilation. Spleen: Normal in size without focal abnormality.  Adrenals/Urinary Tract: No adrenal nodules. No suspicious renal mass, calculi, or hydronephrosis. No focal bladder wall thickening. Stomach/Bowel: Normal appearance of the stomach. Status post subtotal colectomy with ileosigmoid anastomosis. Marked long segment small bowel dilation demonstrating mild mural thickening, similar to 09/27/2022, with abrupt caliber transition near the ileosigmoid anastomosis (5:51). Vascular/Lymphatic: No significant vascular findings are present. No enlarged abdominal or pelvic lymph nodes. Reproductive: Enlarged and lobulated uterus containing multifocal enhancing masses, including a few with coarse calcifications. No adnexal masses. Other: No free fluid, fluid collection, or free air. Musculoskeletal: No acute or abnormal lytic or blastic osseous lesions. IMPRESSION: 1. No evidence of pulmonary embolism. 2. Status post subtotal colectomy with marked long segment small bowel dilation demonstrating mild mural thickening, similar to 09/27/2022, with abrupt caliber transition near the ileosigmoid anastomosis, suspicious for small-bowel obstruction. 3. Unchanged subpleural ground-glass densities along the anterior right upper and middle lobe, likely postradiation change. 4. Enlarged and lobulated uterus containing multifocal enhancing masses, including a few with coarse  calcifications, likely reflecting leiomyomas. 5. Unchanged 3 mm basilar left lower lobe nodule. Electronically Signed   By: Agustin Cree M.D.   On: 11/18/2022 18:33   CT Angio Chest PE W/Cm &/Or Wo Cm  Result Date: 11/18/2022 CLINICAL DATA:  History of breast cancer on chemotherapy with loss of appetite. * Tracking Code: BO * EXAM: CT ANGIOGRAPHY CHEST CT ABDOMEN AND PELVIS WITH CONTRAST TECHNIQUE: Multidetector CT imaging of the chest was performed using the standard protocol during bolus administration of intravenous contrast. Multiplanar CT image reconstructions and MIPs were obtained to evaluate the vascular anatomy.  Multidetector CT imaging of the abdomen and pelvis was performed using the standard protocol during bolus administration of intravenous contrast. RADIATION DOSE REDUCTION: This exam was performed according to the departmental dose-optimization program which includes automated exposure control, adjustment of the mA and/or kV according to patient size and/or use of iterative reconstruction technique. CONTRAST:  OMNIPAQUE IOHEXOL 300 MG/ML  SOLN COMPARISON:  CT abdomen and pelvis dated 09/27/2022, CT chest dated 04/06/2022 FINDINGS: CTA CHEST FINDINGS Cardiovascular: Left chest wall port terminates in the right atrium. The study is high quality for the evaluation of pulmonary embolism. There are no filling defects in the central, lobar, segmental or subsegmental pulmonary artery branches to suggest acute pulmonary embolism. Anatomic variant common origin of the brachiocephalic and common carotid arteries. Normal heart size. No significant pericardial fluid/thickening. Mediastinum/Nodes: Imaged thyroid gland without nodules meeting criteria for imaging follow-up by size. Normal esophagus. No pathologically enlarged axillary, supraclavicular, mediastinal, or hilar lymph nodes. Lungs/Pleura: The central airways are patent. Unchanged subpleural ground-glass densities along the anterior right upper and middle lobe. Unchanged small area of perifissural ground-glass opacity is also seen in the anterior right lower lobe. Unchanged 3 mm basilar left lower lobe nodule (6:88). No pneumothorax. No pleural effusion. Musculoskeletal: No acute or abnormal lytic or blastic osseous lesions. Right mastectomy. Left breast implant. Review of the MIP images confirms the above findings. CT ABDOMEN and PELVIS FINDINGS Hepatobiliary: Subcentimeter hypoattenuating focus in segment 5 (2:23) too small to characterize but unchanged. No intra or extrahepatic biliary ductal dilation. Normal gallbladder. Pancreas: No focal lesions or main  ductal dilation. Spleen: Normal in size without focal abnormality. Adrenals/Urinary Tract: No adrenal nodules. No suspicious renal mass, calculi, or hydronephrosis. No focal bladder wall thickening. Stomach/Bowel: Normal appearance of the stomach. Status post subtotal colectomy with ileosigmoid anastomosis. Marked long segment small bowel dilation demonstrating mild mural thickening, similar to 09/27/2022, with abrupt caliber transition near the ileosigmoid anastomosis (5:51). Vascular/Lymphatic: No significant vascular findings are present. No enlarged abdominal or pelvic lymph nodes. Reproductive: Enlarged and lobulated uterus containing multifocal enhancing masses, including a few with coarse calcifications. No adnexal masses. Other: No free fluid, fluid collection, or free air. Musculoskeletal: No acute or abnormal lytic or blastic osseous lesions. IMPRESSION: 1. No evidence of pulmonary embolism. 2. Status post subtotal colectomy with marked long segment small bowel dilation demonstrating mild mural thickening, similar to 09/27/2022, with abrupt caliber transition near the ileosigmoid anastomosis, suspicious for small-bowel obstruction. 3. Unchanged subpleural ground-glass densities along the anterior right upper and middle lobe, likely postradiation change. 4. 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 5. Unchanged 3 mm basilar left lower lobe nodule. Electronically Signed   By: Agustin Cree M.D.   On: 11/18/2022 18:33    Procedures Procedures  {Document cardiac monitor, telemetry assessment procedure when appropriate:1}  Medications Ordered in ED Medications  ondansetron (ZOFRAN) injection 4 mg (4 mg Intravenous Not Given 11/18/22 1729)  sodium chloride 0.9 % bolus 1,000 mL (has no administration in time range)  sodium chloride 0.9 % bolus 1,000 mL ( Intravenous Stopped 11/18/22 1915)  iohexol  (OMNIPAQUE) 300 MG/ML solution 100 mL (100 mLs Intravenous Contrast Given 11/18/22 1748)    ED Course/ Medical Decision Making/ A&P    {   Click here for ABCD2, HEART and other calculatorsREFRESH Note before signing :1}                              Medical Decision Making Amount and/or Complexity of Data Reviewed Labs: ordered. Radiology: ordered.  Risk Prescription drug management. Decision regarding hospitalization.   Initial Impression and Ddx 47 year old well-appearing female presenting for poor appetite and weight loss.  Exam was unremarkable.  DDx includes sepsis, intra-abdominal infection, PE, bowel obstruction, mets, dehydration, AKI. Patient PMH that increases complexity of ED encounter: history of breast cancer, Crohn's disease, and recent SBO   Interpretation of Diagnostics - I independent reviewed and interpreted the labs as followed: hypokalemia, anemia  - I independently visualized the following imaging with scope of interpretation limited to determining acute life threatening conditions related to emergency care: CT angio chest, which revealed negative PE. CT ab/pelvis findings concerning for SBO  Patient Reassessment and Ultimate Disposition/Management Workup suggestive of small bowel obstruction.  Suspect presenting tachycardia likely due to hypovolemia and has improved since volume resuscitation.  Admitted to hospital service with Dr. Arlean Hopping or for SBO.  Advised surgery of patient.  Patient management required discussion with the following services or consulting groups:  Hospitalist Service  Complexity of Problems Addressed Acute complicated illness or Injury  Additional Data Reviewed and Analyzed Further history obtained from: Past medical history and medications listed in the EMR, Prior ED visit notes, Recent discharge summary, and Recent Consult notes  Patient Encounter Risk Assessment Consideration of hospitalization   {Document critical care time  when appropriate:1} {Document review of labs and clinical decision tools ie heart score, Chads2Vasc2 etc:1}  {Document your independent review of radiology images, and any outside records:1} {Document your discussion with family members, caretakers, and with consultants:1} {Document social determinants of health affecting pt's care:1} {Document your decision making why or why not admission, treatments were needed:1} Final Clinical Impression(s) / ED Diagnoses Final diagnoses:  Small bowel obstruction (HCC)    Rx / DC Orders ED Discharge Orders     None

## 2022-11-19 ENCOUNTER — Inpatient Hospital Stay (HOSPITAL_COMMUNITY): Payer: Managed Care, Other (non HMO)

## 2022-11-19 ENCOUNTER — Encounter (HOSPITAL_COMMUNITY): Payer: Self-pay | Admitting: Internal Medicine

## 2022-11-19 DIAGNOSIS — R1084 Generalized abdominal pain: Secondary | ICD-10-CM | POA: Diagnosis not present

## 2022-11-19 DIAGNOSIS — R110 Nausea: Secondary | ICD-10-CM

## 2022-11-19 DIAGNOSIS — K56699 Other intestinal obstruction unspecified as to partial versus complete obstruction: Secondary | ICD-10-CM

## 2022-11-19 DIAGNOSIS — E876 Hypokalemia: Secondary | ICD-10-CM | POA: Insufficient documentation

## 2022-11-19 DIAGNOSIS — I472 Ventricular tachycardia, unspecified: Secondary | ICD-10-CM

## 2022-11-19 DIAGNOSIS — Z8719 Personal history of other diseases of the digestive system: Secondary | ICD-10-CM

## 2022-11-19 DIAGNOSIS — Z9049 Acquired absence of other specified parts of digestive tract: Secondary | ICD-10-CM

## 2022-11-19 DIAGNOSIS — R627 Adult failure to thrive: Secondary | ICD-10-CM | POA: Insufficient documentation

## 2022-11-19 DIAGNOSIS — I498 Other specified cardiac arrhythmias: Secondary | ICD-10-CM | POA: Diagnosis not present

## 2022-11-19 DIAGNOSIS — K50812 Crohn's disease of both small and large intestine with intestinal obstruction: Secondary | ICD-10-CM

## 2022-11-19 DIAGNOSIS — R630 Anorexia: Secondary | ICD-10-CM | POA: Diagnosis not present

## 2022-11-19 DIAGNOSIS — R933 Abnormal findings on diagnostic imaging of other parts of digestive tract: Secondary | ICD-10-CM

## 2022-11-19 DIAGNOSIS — K56609 Unspecified intestinal obstruction, unspecified as to partial versus complete obstruction: Secondary | ICD-10-CM | POA: Diagnosis present

## 2022-11-19 DIAGNOSIS — D638 Anemia in other chronic diseases classified elsewhere: Secondary | ICD-10-CM | POA: Diagnosis not present

## 2022-11-19 DIAGNOSIS — E440 Moderate protein-calorie malnutrition: Secondary | ICD-10-CM | POA: Insufficient documentation

## 2022-11-19 DIAGNOSIS — E43 Unspecified severe protein-calorie malnutrition: Secondary | ICD-10-CM | POA: Insufficient documentation

## 2022-11-19 HISTORY — DX: Other intestinal obstruction unspecified as to partial versus complete obstruction: K56.699

## 2022-11-19 HISTORY — DX: Acquired absence of other specified parts of digestive tract: Z90.49

## 2022-11-19 LAB — CBC
HCT: 28.9 % — ABNORMAL LOW (ref 36.0–46.0)
Hemoglobin: 9.2 g/dL — ABNORMAL LOW (ref 12.0–15.0)
MCH: 25.1 pg — ABNORMAL LOW (ref 26.0–34.0)
MCHC: 31.8 g/dL (ref 30.0–36.0)
MCV: 78.7 fL — ABNORMAL LOW (ref 80.0–100.0)
Platelets: 364 10*3/uL (ref 150–400)
RBC: 3.67 MIL/uL — ABNORMAL LOW (ref 3.87–5.11)
RDW: 16.1 % — ABNORMAL HIGH (ref 11.5–15.5)
WBC: 7.7 10*3/uL (ref 4.0–10.5)
nRBC: 0 % (ref 0.0–0.2)

## 2022-11-19 LAB — BASIC METABOLIC PANEL
Anion gap: 11 (ref 5–15)
BUN: 7 mg/dL (ref 6–20)
CO2: 20 mmol/L — ABNORMAL LOW (ref 22–32)
Calcium: 8 mg/dL — ABNORMAL LOW (ref 8.9–10.3)
Chloride: 101 mmol/L (ref 98–111)
Creatinine, Ser: 0.66 mg/dL (ref 0.44–1.00)
GFR, Estimated: >60 mL/min (ref >60)
Glucose, Bld: 78 mg/dL (ref 70–99)
Potassium: 3.7 mmol/L (ref 3.5–5.1)
Sodium: 132 mmol/L — ABNORMAL LOW (ref 135–145)

## 2022-11-19 LAB — ECHOCARDIOGRAM LIMITED
Height: 64 in
S' Lateral: 2.2 cm
Weight: 1760 oz

## 2022-11-19 LAB — MAGNESIUM: Magnesium: 1.8 mg/dL (ref 1.7–2.4)

## 2022-11-19 MED ORDER — SODIUM CHLORIDE 0.9 % IV SOLN: 250.0000 mL | INTRAVENOUS | Status: DC | PRN

## 2022-11-19 MED ORDER — POTASSIUM CHLORIDE 2 MEQ/ML IV SOLN
INTRAVENOUS | Status: AC
  Filled 2022-11-19 (×4): qty 1000

## 2022-11-19 MED ORDER — LACTATED RINGERS IV SOLN: INTRAVENOUS | Status: DC

## 2022-11-19 MED ORDER — POTASSIUM CHLORIDE CRYS ER 20 MEQ PO TBCR: 40.0000 meq | EXTENDED_RELEASE_TABLET | Freq: Every day | ORAL | Status: DC

## 2022-11-19 MED ORDER — SODIUM CHLORIDE 0.9% FLUSH
10.0000 mL | INTRAVENOUS | Status: DC | PRN
  Administered 2022-11-22: 10 mL

## 2022-11-19 MED ORDER — SODIUM CHLORIDE 0.9% FLUSH: 3.0000 mL | INTRAVENOUS | Status: DC | PRN

## 2022-11-19 MED ORDER — HEPARIN SODIUM (PORCINE) 5000 UNIT/ML IJ SOLN: 5000.0000 [IU] | Freq: Three times a day (TID) | INTRAMUSCULAR | Status: DC

## 2022-11-19 MED ORDER — LACTATED RINGERS IV BOLUS
1000.0000 mL | Freq: Once | INTRAVENOUS | Status: AC
  Administered 2022-11-19: 1000 mL via INTRAVENOUS

## 2022-11-19 MED ORDER — NYSTATIN 100000 UNIT/ML MT SUSP
5.0000 mL | Freq: Four times a day (QID) | OROMUCOSAL | Status: DC
  Administered 2022-11-19 – 2022-11-23 (×10): 500000 [IU] via ORAL
  Filled 2022-11-19 (×12): qty 5

## 2022-11-19 MED ORDER — SENNOSIDES-DOCUSATE SODIUM 8.6-50 MG PO TABS: 1.0000 | ORAL_TABLET | Freq: Two times a day (BID) | ORAL | Status: DC

## 2022-11-19 MED ORDER — ONDANSETRON HCL 4 MG/2ML IJ SOLN: 4.0000 mg | Freq: Four times a day (QID) | INTRAMUSCULAR | Status: DC | PRN

## 2022-11-19 MED ORDER — METOPROLOL TARTRATE 5 MG/5ML IV SOLN
5.0000 mg | Freq: Once | INTRAVENOUS | Status: AC
  Administered 2022-11-19: 5 mg via INTRAVENOUS
  Filled 2022-11-19: qty 5

## 2022-11-19 MED ORDER — SODIUM CHLORIDE 0.9% FLUSH
3.0000 mL | Freq: Two times a day (BID) | INTRAVENOUS | Status: DC
  Administered 2022-11-19 – 2022-11-20 (×2): 3 mL via INTRAVENOUS

## 2022-11-19 MED ORDER — POTASSIUM CHLORIDE 10 MEQ/100ML IV SOLN
10.0000 meq | INTRAVENOUS | Status: AC
  Administered 2022-11-19 (×4): 10 meq via INTRAVENOUS
  Filled 2022-11-19 (×4): qty 100

## 2022-11-19 MED ORDER — ACETAMINOPHEN 650 MG RE SUPP: 650.0000 mg | Freq: Four times a day (QID) | RECTAL | Status: DC | PRN

## 2022-11-19 MED ORDER — POTASSIUM CHLORIDE 10 MEQ/100ML IV SOLN
10.0000 meq | INTRAVENOUS | Status: DC
  Administered 2022-11-19 (×2): 10 meq via INTRAVENOUS
  Filled 2022-11-19 (×4): qty 100

## 2022-11-19 MED ORDER — LACTATED RINGERS IV BOLUS: 1000.0000 mL | Freq: Three times a day (TID) | INTRAVENOUS | Status: DC | PRN

## 2022-11-19 MED ORDER — MAGNESIUM SULFATE 2 GM/50ML IV SOLN
2.0000 g | Freq: Once | INTRAVENOUS | Status: AC
  Administered 2022-11-19: 2 g via INTRAVENOUS
  Filled 2022-11-19: qty 50

## 2022-11-19 MED ORDER — ACETAMINOPHEN 325 MG PO TABS
650.0000 mg | ORAL_TABLET | Freq: Four times a day (QID) | ORAL | Status: DC | PRN
  Administered 2022-11-19 – 2022-11-21 (×2): 650 mg via ORAL
  Filled 2022-11-19 (×2): qty 2

## 2022-11-19 MED ORDER — SODIUM CHLORIDE 0.9 % IV BOLUS: 500.0000 mL | Freq: Once | INTRAVENOUS | Status: DC

## 2022-11-19 MED ORDER — SENNOSIDES-DOCUSATE SODIUM 8.6-50 MG PO TABS
1.0000 | ORAL_TABLET | Freq: Every day | ORAL | Status: DC
  Filled 2022-11-19: qty 1

## 2022-11-19 MED ORDER — CHLORHEXIDINE GLUCONATE CLOTH 2 % EX PADS
6.0000 | MEDICATED_PAD | Freq: Every day | CUTANEOUS | Status: DC
  Administered 2022-11-19 – 2022-11-23 (×4): 6 via TOPICAL

## 2022-11-19 MED ORDER — ONDANSETRON HCL 4 MG PO TABS: 4.0000 mg | ORAL_TABLET | Freq: Four times a day (QID) | ORAL | Status: DC | PRN

## 2022-11-19 NOTE — Progress Notes (Addendum)
  Patient continued to be tachycardic heart rate to 140.  EKG showed sinus tachycardia. - Patient denies any chest pain, palpitation and chest discomfort.  Blood pressure 127/91. - Giving patient 1 dose of Lopressor 5 mg IV.  Continue IV fluid LR 125 cc/h for volume repletion.  Goal to keep the heart rate below 100. - Chart review found out that echocardiogram from 05/24/2022 patient has grade 1 diastolic heart failure with preserved EF 60 to 65%. -Obtaining limited echocardiogram in the a.m. -Continue cardiac monitoring.  Tereasa Coop, MD [facility] 11/19/2022, 2:48 AM

## 2022-11-19 NOTE — Consult Note (Addendum)
[name]  1975/07/30 [id number]  CARE TEAM:  PCP: Ollen Bowl, MD  Outpatient Care Team: Patient Care Team: Ollen Bowl, MD as PCP - General (Internal Medicine) Maisie Fus, MD as PCP - Cardiology (Cardiology) Pershing Proud, RN as Oncology Nurse Navigator Donnelly Angelica, RN as Oncology Nurse Navigator Romie Levee, MD as Consulting Physician (Colon and Rectal Surgery) Iva Boop, MD as Consulting Physician (Gastroenterology) Emelia Loron, MD as Consulting Physician (General Surgery) Serena Croissant, MD as Consulting Physician (Hematology and Oncology)  Inpatient Treatment Team: Treatment Team:  Alba Cory, MD Lilyan Gilford, MD [facility], Md, MD Diona Browner, RN Mansouraty, Netty Starring., MD Merwyn Katos, NT Bess Kinds, RN   This patient is a 47 y.o.female who presents today for surgical evaluation at the request of Dr Arlean Hopping.   Chief complaint / Reason for evaluation: Abnormal CT scan:  Dilated SB  47 year old woman with numerous medical problems.   She has a history of Crohn's disease with prior strictures ultimately requiring subtotal abdominal colectomy with ileosigmoid anastomosis in 2021 by Dr Maisie Fus of our group.   Metastatic breast cancer on maintenance chemotherapy.  Inflammatory presentation.  Bilateral mastectomies.  Patient is followed by [facility] gastroenterology.  Initially had been managed on mesalamine and then developed stricturing.  She has been on numerous immunotherapies including steroids.  Had been on Entyvio but with diagnosis of branch cancer that has been on hold.  Evidence of proctitis Crohn's at anastomosis May 2023.  Patient felt worse fatigue and poor p.o. intake so chemotherapy was held as of October 11, 2022 by Dr. Pamelia Hoit.  Given her issues with her inflammatory breast cancer and Crohn's disease, she struggled with a lot of fatigue and decreased appetite and loose bowel movements.  Normally moves  her bowels 2-3 times a day not needing antidiarrheals at this point.  Patient felt fatigued and worse and came to the ER.  CAT scan done.  Showed some dilated small bowel with some question of equalization/SIBO and distal bowel but this is her usual.  Dr. Freida Busman was called last night with our group.  No strong sense of emergency and skeptical of bowel obstruction diagnosis.  Recommendation from medical and gastrology consultations.    Patient denies much abdominal pain and no nausea vomiting.  No hematochezia or melena.  Been moving her bowels okay.  I think her main complaint is just feeling fatigued.  Assessment  [name]  47 y.o. female       Problem List:  Principal Problem:   SBO (small bowel obstruction) (HCC) Active Problems:   Crohn's disease of small and large intestines with complication (HCC)   History of abdominal subtotal colectomy   History of Crohn's disease   Hypokalemia   Chronic disease anemia   Small bowel obstruction (HCC)   Failure to thrive in the setting of metastatic breast cancer recently on systemic chemotherapy and chronic Crohn's disease with prior resections.  Plan:  I suspect her chronic small bowel dilatation is due to the fact that her small bowel has had to adapt with most of the colon gone.  I do not see any definite hard evidence of obstruction on CAT scan and certainly clinically she is improved.  I suspect her tachycardia is most likely due to dehydration.  Agree with fluid resuscitation.  Will order another liter of fluids &  regroup.  Hypokalemia with hypomagnesemia.  Replacing.  There is no need for  surgical intervention this time.  Would hold off in this patient who has already lost some small bowel and most of her colon unless she is exhausted all nonsurgical options.  If no procedures planned today, can advance to solid diet as tolerated from surgery standpoint.  Most slightly start with clears-dysphagia 1/fulls-soft over the next 24  hours.  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  Evidence of that last year.  She is off systemic chemotherapy and does not want to be back on that, so maybe she can get back on some biological or other immunosuppressive regimen for Crohn's disease.  That definitely seems like a multidisciplinary discussion between medicine, medical oncology, and gastrology.  Surgery will hold off.  Surgery will help follow more peripherally.  Please call this for future concerns  -VTE prophylaxis- SCDs, etc -mobilize as tolerated to help recovery  I reviewed nursing notes, ED provider notes, Consultant GI notes, hospitalist notes, last 24 h vitals and pain scores, last 48 h intake and output, last 24 h labs and trends, and last 24 h imaging results. I have reviewed this patient's available data, including medical history, events of note, test results, etc as part of my evaluation.  A significant portion of that time was spent in counseling.  Care during the described time interval was provided by me.  This care required moderate level of medical decision making.  11/19/2022  Ardeth Sportsman, MD, FACS, MASCRS Esophageal, Gastrointestinal & Colorectal Surgery Robotic and Minimally Invasive Surgery  [facility] [address] [place], [place] [address] [phone] Fax [phone] Main  CONTACT INFORMATION: Weekday (9AM-5PM): Call [facility] main office at [phone] Weeknight (5PM-9AM) or Weekend/Holiday: Check EPIC "Web Links" tab & use "AMION" (password " [email or web address]") for General Surgery [facility] coverage  Please, DO NOT use SecureChat  (it is not reliable communication to reach operating surgeons & will lead to a delay in care).   Epic staff messaging available for  outptient concerns needing 1-2 business day response.      11/19/2022      Past Medical History:  Diagnosis Date   Cancer [facility])    Colon stricture (HCC) 05/07/2019   Crohn's colitis, with intestinal obstruction (HCC) 09/17/2013   Dx 2006 - right and left colon involved 2011 - pan-colitis 09/17/2013 left colitis 60-20 cm March 2021 subtotal colectomy ileosigmoid anastomosis for colonic strictures-Dr. Maisie Fus     Crohn's disease [facility])    Family history of breast cancer 06/11/2020   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 10/17/2010   Rectal bleeding    Uterine fibroid    Vitamin D deficiency 09/24/2013    Past Surgical History:  Procedure Laterality Date   AXILLARY LYMPH NODE DISSECTION Right 11/09/2020   Procedure: RIGHT AXILLARY LYMPH NODE DISSECTION;  Surgeon: Emelia Loron, MD;  Location: [facility];  Service: General;  Laterality: Right;   BIOPSY  07/01/2020   Procedure: BIOPSY;  Surgeon: Benancio Deeds, MD;  Location: [facility];  Service: Gastroenterology;;   BIOPSY  01/27/2021   Procedure: BIOPSY;  Surgeon: Benancio Deeds, MD;  Location: [facility];  Service: Gastroenterology;;   BIOPSY  09/07/2021   Procedure: BIOPSY;  Surgeon: Lynann Bologna, MD;  Location: [facility];  Service: Gastroenterology;;  BREAST BIOPSY Right 03/24/2022   [place] RT BREAST BX W LOC DEV 1ST LESION IMG BX SPEC US GUIDE 03/24/2022 [facility]   BREAST BIOPSY  04/28/2022   [place] RT RADIOACTIVE SEED LOC 04/28/2022 [facility]   BREAST IMPLANT REMOVAL Right 08/07/2022   Procedure: REMOVAL  OF RIGHT BREAST IMPLANTS;  Surgeon: Allena Napoleon, MD;  Location: [facility];  Service: Plastics;  Laterality: Right;   BREAST RECONSTRUCTION WITH PLACEMENT OF TISSUE EXPANDER AND FLEX HD (ACELLULAR HYDRATED DERMIS) Bilateral 11/09/2020   Procedure: BILATERAL BREAST RECONSTRUCTION WITH PLACEMENT OF TISSUE EXPANDER AND FLEX HD  (ACELLULAR HYDRATED DERMIS);  Surgeon: Allena Napoleon, MD;  Location: [facility];  Service: Plastics;  Laterality: Bilateral;   CAPSULOTOMY Bilateral 07/25/2021   Procedure: CAPSULOTOMY;  Surgeon: Allena Napoleon, MD;  Location: [facility];  Service: Plastics;  Laterality: Bilateral;   COLECTOMY  06/27/2019   COLONOSCOPY  2015   FLEXIBLE SIGMOIDOSCOPY N/A 07/01/2020   Procedure: FLEXIBLE SIGMOIDOSCOPY;  Surgeon: Benancio Deeds, MD;  Location: [facility];  Service: Gastroenterology;  Laterality: N/A;   FLEXIBLE SIGMOIDOSCOPY N/A 01/27/2021   Procedure: FLEXIBLE SIGMOIDOSCOPY;  Surgeon: Benancio Deeds, MD;  Location: [facility];  Service: Gastroenterology;  Laterality: N/A;   FLEXIBLE SIGMOIDOSCOPY N/A 09/07/2021   Procedure: FLEXIBLE SIGMOIDOSCOPY;  Surgeon: Lynann Bologna, MD;  Location: [facility];  Service: Gastroenterology;  Laterality: N/A;   NIPPLE SPARING MASTECTOMY Bilateral 11/09/2020   Procedure: BILATERAL NIPPLE SPARING MASTECTOMY;  Surgeon: Emelia Loron, MD;  Location: [facility];  Service: General;  Laterality: Bilateral;   PORT-A-CATH REMOVAL N/A 07/25/2021   Procedure: REMOVAL PORT-A-CATH;  Surgeon: Allena Napoleon, MD;  Location: [facility];  Service: Plastics;  Laterality: N/A;   PORTACATH PLACEMENT N/A 06/23/2020   Procedure: INSERTION PORT-A-CATH;  Surgeon: Emelia Loron, MD;  Location: [facility];  Service: General;  Laterality: N/A;  START TIME OF 11:00 AM FOR 60 MINUTES [place] [facility]   PORTACATH PLACEMENT Left 05/02/2022   Procedure: INSERTION PORT-A-CATH;  Surgeon: Emelia Loron, MD;  Location: [facility];  Service: General;  Laterality: Left;   RADIOACTIVE SEED GUIDED EXCISIONAL BREAST BIOPSY Right 05/02/2022   Procedure: RADIOACTIVE SEED GUIDED RIGHT BREAST MASS EXCISION;  Surgeon: Emelia Loron, MD;  Location: [facility];  Service: General;   Laterality: Right;   REMOVAL OF BILATERAL TISSUE EXPANDERS WITH PLACEMENT OF BILATERAL BREAST IMPLANTS Bilateral 07/25/2021   Procedure: REMOVAL OF BILATERAL TISSUE EXPANDERS WITH PLACEMENT OF BILATERAL BREAST IMPLANTS;  Surgeon: Allena Napoleon, MD;  Location:  [facility]ERY [facility];  Service: Plastics;  Laterality: Bilateral;  1.5    Social History   Socioeconomic History   Marital status: Single    Spouse name: Not on file   Number of children: 1   Years of education: 13   Highest education level: Not on file  Occupational History   Occupation: Medical Coder   Tobacco Use   Smoking status: Former    Types: Cigarettes   Smokeless tobacco: Never  Vaping Use   Vaping status: Never Used  Substance and Sexual Activity   Alcohol use: Yes    Alcohol/week: 1.0 - 2.0 standard drink of alcohol    Types: 1 - 2 Glasses of wine per week   Drug use: No   Sexual activity: Yes    Partners: Male    Comment: Intermittent protection not consistent  Other Topics Concern   Not on file  Social History Narrative   Single, 1 daughter born 2001 approximately   Energy manager for [employer]   She is a former smoker, occasional alcohol no drug use   Social Determinants of Corporate investment banker Strain: Not on file  Food Insecurity: No Food Insecurity (11/18/2022)   Hunger Vital Sign    Worried About Running Out of Food in the Last Year: Never true    Ran Out of Food in the Last Year: Never true  Transportation Needs: No Transportation Needs (11/18/2022)   PRAPARE - Administrator, Civil Service (Medical): No    Lack of Transportation (Non-Medical): No  Physical Activity: Not on file  Stress: Not on file  Social Connections: Not on file  Intimate Partner Violence: Not At Risk (11/18/2022)   Humiliation, Afraid, Rape, and Kick questionnaire    Fear of Current or Ex-Partner: No    Emotionally Abused: No    Physically Abused: No    Sexually Abused: No    Family History   Problem Relation Age of Onset   Diabetes Father    Breast cancer Maternal Aunt        dx unknown age   Diabetes Paternal Aunt    Breast cancer Cousin        maternal cousin; dx unknown age   Esophageal cancer Neg Hx    Rectal cancer Neg Hx    Stomach cancer Neg Hx    Colon polyps Neg Hx    Colon cancer Neg Hx     Current Facility-Administered Medications  Medication Dose Route Frequency Provider Last Rate Last Admin   0.9 %  sodium chloride infusion  250 mL Intravenous PRN Sundil, Subrina, MD       acetaminophen (TYLENOL) tablet 650 mg  650 mg Oral Q6H PRN Janalyn Shy, Subrina, MD       Or   acetaminophen (TYLENOL) suppository 650 mg  650 mg Rectal Q6H PRN Janalyn Shy, Subrina, MD       Chlorhexidine Gluconate Cloth 2 % PADS 6 each  6 each Topical Daily Sundil, Subrina, MD       heparin injection 5,000 Units  5,000 Units Subcutaneous Q8H Sundil, Subrina, MD       lactated ringers 1,000 mL with potassium chloride 10 mEq infusion   Intravenous Continuous Sundil, Subrina, MD 100 mL/hr at 11/19/22 0351 Infusion Verify at 11/19/22 0351   magnesium sulfate IVPB 2 g 50 mL  2 g Intravenous Once Regalado, Belkys A, MD       ondansetron (ZOFRAN) injection 4 mg  4 mg Intravenous Once Sundil, Subrina, MD       ondansetron (ZOFRAN) tablet 4 mg  4 mg Oral Q6H PRN Janalyn Shy, Subrina, MD       Or   ondansetron (ZOFRAN) injection 4 mg  4 mg Intravenous Q6H PRN Sundil, Subrina, MD       sodium chloride flush (NS) 0.9 % injection 3 mL  3 mL Intravenous Q12H Sundil, Subrina, MD   3 mL at 11/19/22 0138   sodium chloride flush (NS) 0.9 % injection 3 mL  3 mL Intravenous PRN Janalyn Shy, Subrina, MD         Allergies  Allergen Reactions   Nsaids Other (See Comments)    Crohns disease/ IBD   Other     BLOOD PRODUCT REFUSAL    ROS:   All other systems reviewed & are negative except per HPI or as noted below: Constitutional:  No fevers, chills, sweats.  Weight stable Eyes:  No vision changes, No discharge HENT:  No  sore throats, nasal drainage Lymph: No neck swelling, No bruising easily Pulmonary:  No cough, productive sputum CV: No orthopnea, PND  Patient walks 20 minutes without difficulty.  No exertional chest/neck/shoulder/arm pain.  GI:  No personal nor family history of GI/colon cancer, irritable bowel syndrome, allergy such as Celiac Sprue, dietary/dairy problems, colitis, ulcers nor gastritis.  No recent sick contacts/gastroenteritis.  No travel outside the country.  No changes in diet.  Renal: No UTIs, No hematuria Genital:  No drainage, bleeding, masses Musculoskeletal: No severe joint pain.  Good ROM major joints Skin:  No sores or lesions Heme/Lymph:  No easy bleeding.  No swollen lymph nodes   BP 112/82 (BP Location: Left Arm)   Pulse (!) 107   Temp 98.9 F (37.2 C) (Oral)   Resp 18   Ht 5\' 4"  (1.626 m)   Wt 49.9 kg   LMP 04/24/2021   SpO2 99%   BMI 18.88 kg/m   Physical Exam:  Constitutional: Not cachectic.  Hygeine adequate.  Vitals signs as above.  Well-developed.  Somewhat thin but not frankly cachectic. Eyes: Pupils reactive, normal extraocular movements. Sclera nonicteric.  Wears glasses.  Vision corrected. Neuro: CN II-XII intact.  No major focal sensory defects.  No major motor deficits. Lymph: No head/neck/groin lymphadenopathy Psych:  No severe agitation.  No severe anxiety.  Judgment & insight Adequate, Oriented x4, HENT: Normocephalic, Mucus membranes moist.  No thrush.   Neck: Supple, No tracheal deviation.  No obvious thyromegaly Chest: No pain to chest wall compression.  Good respiratory excursion.  No audible wheezing CV:  Pulses intact.  regular rhythm.  No major extremity edema  Abdomen:  Flat Hernia: Not present. Diastasis recti: Not present. Soft.   Mildly distended.  Nontender.  No hepatomegaly.  No splenomegaly  Gen:  Inguinal hernia: Not present.  Inguinal lymph nodes: without lymphadenopathy.    Rectal: (Deferred)  Ext: No obvious deformity or  contracture.  Edema: Not present.  No cyanosis Skin: No major subcutaneous nodules.  Warm and dry Musculoskeletal: Severe joint rigidity not present.  No obvious clubbing.  No digital petechiae.     Results:   Labs: Results for orders placed or performed during the hospital encounter of 11/18/22 (from the past 48 hour(s))  CBC with Differential     Status: Abnormal   Collection Time: 11/18/22  5:07 PM  Result Value Ref Range   WBC 8.8 4.0 - 10.5 K/uL   RBC 3.99 3.87 - 5.11 MIL/uL   Hemoglobin 10.1 (L) 12.0 - 15.0 g/dL   HCT 32.4 (L) 40.1 - 02.7 %   MCV 76.7 (L) 80.0 - 100.0 fL   MCH 25.3 (L) 26.0 - 34.0 pg   MCHC 33.0 30.0 - 36.0 g/dL   RDW 25.3 (H) 66.4 - 40.3 %   Platelets 391 150 - 400 K/uL   nRBC 0.0 0.0 - 0.2 %   Neutrophils Relative % 67 %   Neutro Abs 6.0 1.7 - 7.7 K/uL   Lymphocytes Relative 19 %   Lymphs Abs 1.7 0.7 - 4.0 K/uL   Monocytes Relative 12 %   Monocytes Absolute 1.0 0.1 - 1.0 K/uL   Eosinophils Relative 0 %   Eosinophils Absolute 0.0 0.0 - 0.5 K/uL   Basophils Relative 1 %   Basophils Absolute 0.0 0.0 - 0.1 K/uL   Immature Granulocytes 1 %   Abs Immature Granulocytes 0.05 0.00 - 0.07 K/uL  Comment: Performed at [facility], [address] [place], [place] [address]  Comprehensive metabolic panel     Status: Abnormal   Collection Time: 11/18/22  5:07 PM  Result Value Ref Range   Sodium 137 135 - 145 mmol/L   Potassium 3.2 (L) 3.5 - 5.1 mmol/L   Chloride 101 98 - 111 mmol/L   CO2 25 22 - 32 mmol/L   Glucose, Bld 88 70 - 99 mg/dL    Comment: Glucose reference range applies only to samples taken after fasting for at least 8 hours.   BUN 10 6 - 20 mg/dL   Creatinine, Ser 6.04 0.44 - 1.00 mg/dL   Calcium 8.4 (L) 8.9 - 10.3 mg/dL   Total Protein 5.9 (L) 6.5 - 8.1 g/dL   Albumin 2.9 (L) 3.5 - 5.0 g/dL   AST 9 (L) 15 - 41 U/L   ALT 6 0 - 44 U/L   Alkaline Phosphatase 106 38 - 126 U/L   Total Bilirubin 0.6 0.3 - 1.2 mg/dL   GFR,  Estimated >54 >09 mL/min    Comment: (NOTE) Calculated using the CKD-EPI Creatinine Equation (2021)    Anion gap 11 5 - 15    Comment: Performed at [facility], [address], [place], [place] [address]  Lipase, blood     Status: None   Collection Time: 11/18/22  5:07 PM  Result Value Ref Range   Lipase 11 11 - 51 U/L    Comment: Performed at [facility], [address] [place], [place] [address]  Lactic acid, plasma     Status: None   Collection Time: 11/18/22  5:07 PM  Result Value Ref Range   Lactic Acid, Venous 0.6 0.5 - 1.9 mmol/L    Comment: Performed at [facility], [address] [place], [place] [address]  Magnesium     Status: None   Collection Time: 11/19/22 12:44 AM  Result Value Ref Range   Magnesium 1.8 1.7 - 2.4 mg/dL    Comment: Performed at [facility], [address] [place], [place] [address]    Imaging / Studies: CT ABDOMEN PELVIS W CONTRAST  Result Date: 11/18/2022 CLINICAL DATA:  History of breast cancer on chemotherapy with loss of appetite. * Tracking Code: BO * EXAM: CT ANGIOGRAPHY CHEST CT ABDOMEN AND PELVIS WITH CONTRAST TECHNIQUE: Multidetector CT imaging of the chest was performed using the standard protocol during bolus administration of intravenous contrast. Multiplanar CT image reconstructions and MIPs were obtained to evaluate the vascular anatomy. Multidetector CT imaging of the abdomen and pelvis was performed using the standard protocol during bolus administration of intravenous contrast. RADIATION DOSE REDUCTION: This exam was performed according to the departmental dose-optimization program which includes automated exposure control, adjustment of the mA and/or kV according to patient size and/or use of iterative reconstruction technique. CONTRAST:  OMNIPAQUE IOHEXOL 300 MG/ML  SOLN COMPARISON:  CT abdomen and pelvis dated 09/27/2022, CT chest dated 04/06/2022  FINDINGS: CTA CHEST FINDINGS Cardiovascular: Left chest wall port terminates in the right atrium. The study is high quality for the evaluation of pulmonary embolism. There are no filling defects in the central, lobar, segmental or subsegmental pulmonary artery branches to suggest acute pulmonary embolism. Anatomic variant common origin of the brachiocephalic and common carotid arteries. Normal heart size. No significant pericardial fluid/thickening. Mediastinum/Nodes: Imaged thyroid gland without nodules meeting criteria for imaging follow-up by size. Normal esophagus. No pathologically enlarged axillary, supraclavicular, mediastinal, or hilar lymph  nodes. Lungs/Pleura: The central airways are patent. Unchanged subpleural ground-glass densities along the anterior right upper and middle lobe. Unchanged small area of perifissural ground-glass opacity is also seen in the anterior right lower lobe. Unchanged 3 mm basilar left lower lobe nodule (6:88). No pneumothorax. No pleural effusion. Musculoskeletal: No acute or abnormal lytic or blastic osseous lesions. Right mastectomy. Left breast implant. Review of the MIP images confirms the above findings. CT ABDOMEN and PELVIS FINDINGS Hepatobiliary: Subcentimeter hypoattenuating focus in segment 5 (2:23) too small to characterize but unchanged. No intra or extrahepatic biliary ductal dilation. Normal gallbladder. Pancreas: No focal lesions or main ductal dilation. Spleen: Normal in size without focal abnormality. Adrenals/Urinary Tract: No adrenal nodules. No suspicious renal mass, calculi, or hydronephrosis. No focal bladder wall thickening. Stomach/Bowel: Normal appearance of the stomach. Status post subtotal colectomy with ileosigmoid anastomosis. Marked long segment small bowel dilation demonstrating mild mural thickening, similar to 09/27/2022, with abrupt caliber transition near the ileosigmoid anastomosis (5:51). Vascular/Lymphatic: No significant vascular findings  are present. No enlarged abdominal or pelvic lymph nodes. Reproductive: Enlarged and lobulated uterus containing multifocal enhancing masses, including a few with coarse calcifications. No adnexal masses. Other: No free fluid, fluid collection, or free air. Musculoskeletal: No acute or abnormal lytic or blastic osseous lesions. IMPRESSION: 1. No evidence of pulmonary embolism. 2. Status post subtotal colectomy with marked long segment small bowel dilation demonstrating mild mural thickening, similar to 09/27/2022, with abrupt caliber transition near the ileosigmoid anastomosis, suspicious for small-bowel obstruction. 3. Unchanged subpleural ground-glass densities along the anterior right upper and middle lobe, likely postradiation change. 4. 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 5. Unchanged 3 mm basilar left lower lobe nodule. Electronically Signed   By: Agustin Cree M.D.   On: 11/18/2022 18:33   CT Angio Chest PE W/Cm &/Or Wo Cm  Result Date: 11/18/2022 CLINICAL DATA:  History of breast cancer on chemotherapy with loss of appetite. * Tracking Code: BO * EXAM: CT ANGIOGRAPHY CHEST CT ABDOMEN AND PELVIS WITH CONTRAST TECHNIQUE: Multidetector CT imaging of the chest was performed using the standard protocol during bolus administration of intravenous contrast. Multiplanar CT image reconstructions and MIPs were obtained to evaluate the vascular anatomy. Multidetector CT imaging of the abdomen and pelvis was performed using the standard protocol during bolus administration of intravenous contrast. RADIATION DOSE REDUCTION: This exam was performed according to the departmental dose-optimization program which includes automated exposure control, adjustment of the mA and/or kV according to patient size and/or use of iterative reconstruction technique. CONTRAST:  OMNIPAQUE IOHEXOL 300 MG/ML  SOLN COMPARISON:  CT  abdomen and pelvis dated 09/27/2022, CT chest dated 04/06/2022 FINDINGS: CTA CHEST FINDINGS Cardiovascular: Left chest wall port terminates in the right atrium. The study is high quality for the evaluation of pulmonary embolism. There are no filling defects in the central, lobar, segmental or subsegmental pulmonary artery branches to suggest acute pulmonary embolism. Anatomic variant common origin of the brachiocephalic and common carotid arteries. Normal heart size. No significant pericardial fluid/thickening. Mediastinum/Nodes: Imaged thyroid gland without nodules meeting criteria for imaging follow-up by size. Normal esophagus. No pathologically enlarged axillary, supraclavicular, mediastinal, or hilar lymph nodes. Lungs/Pleura: The central airways are patent. Unchanged subpleural ground-glass densities along the anterior right upper and middle lobe. Unchanged small area of perifissural ground-glass opacity is also seen in the anterior right lower lobe. Unchanged 3 mm basilar left lower lobe nodule (6:88). No pneumothorax. No pleural  effusion. Musculoskeletal: No acute or abnormal lytic or blastic osseous lesions. Right mastectomy. Left breast implant. Review of the MIP images confirms the above findings. CT ABDOMEN and PELVIS FINDINGS Hepatobiliary: Subcentimeter hypoattenuating focus in segment 5 (2:23) too small to characterize but unchanged. No intra or extrahepatic biliary ductal dilation. Normal gallbladder. Pancreas: No focal lesions or main ductal dilation. Spleen: Normal in size without focal abnormality. Adrenals/Urinary Tract: No adrenal nodules. No suspicious renal mass, calculi, or hydronephrosis. No focal bladder wall thickening. Stomach/Bowel: Normal appearance of the stomach. Status post subtotal colectomy with ileosigmoid anastomosis. Marked long segment small bowel dilation demonstrating mild mural thickening, similar to 09/27/2022, with abrupt caliber transition near the ileosigmoid anastomosis  (5:51). Vascular/Lymphatic: No significant vascular findings are present. No enlarged abdominal or pelvic lymph nodes. Reproductive: Enlarged and lobulated uterus containing multifocal enhancing masses, including a few with coarse calcifications. No adnexal masses. Other: No free fluid, fluid collection, or free air. Musculoskeletal: No acute or abnormal lytic or blastic osseous lesions. IMPRESSION: 1. No evidence of pulmonary embolism. 2. Status post subtotal colectomy with marked long segment small bowel dilation demonstrating mild mural thickening, similar to 09/27/2022, with abrupt caliber transition near the ileosigmoid anastomosis, suspicious for small-bowel obstruction. 3. Unchanged subpleural ground-glass densities along the anterior right upper and middle lobe, likely postradiation change. 4. 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 5. Unchanged 3 mm basilar left lower lobe nodule. Electronically Signed   By: Agustin Cree M.D.   On: 11/18/2022 18:33    Medications / Allergies: per chart  Antibiotics: Anti-infectives (From admission, onward)    None         Note: Portions of this report may have been transcribed using voice recognition software. Every effort was made to ensure accuracy; however, inadvertent computerized transcription errors may be present.   Any transcriptional errors that result from this process are unintentional.    Ardeth Sportsman, MD, FACS, MASCRS Esophageal, Gastrointestinal & Colorectal Surgery Robotic and Minimally Invasive Surgery  [facility] [address] [place], [place] [address] [phone] Fax [phone] Main  CONTACT INFORMATION: Weekday (9AM-5PM): Call [facility] main office at [phone] Weeknight (5PM-9AM) or Weekend/Holiday: Check EPIC "Web Links" tab & use "AMION" (password "  [email or web address]") for General Surgery [facility] coverage  Please, DO NOT use SecureChat  (it is not reliable communication to reach operating surgeons & will lead to a delay in care).   Epic staff messaging available for outptient concerns needing 1-2 business day response.       11/19/2022  8:03 AM

## 2022-11-19 NOTE — Progress Notes (Signed)
Patient was a yellow MEWS on arrival to the unit. She had been Sinus Tach all day per nurse at [facility] where she transferred from and her Pulse rate was high even after she arrived. Charge Nurse informed   11/18/22 2316  Vitals  Temp 98.6 F (37 C)  Temp Source Oral  BP (!) 126/91  MAP (mmHg) 102  BP Location Left Arm  BP Method Automatic  Patient Position (if appropriate) Lying  Pulse Rate (!) 117  Pulse Rate Source Monitor  Resp 20  Level of Consciousness  Level of Consciousness Alert  MEWS COLOR  MEWS Score Color Yellow  Oxygen Therapy  SpO2 100 %  O2 Device Room Air  Pain Assessment  Pain Scale 0-10  Pain Score 2  Pain Type Acute pain  MEWS Score  MEWS Temp 0  MEWS Systolic 0  MEWS Pulse 2  MEWS RR 0  MEWS LOC 0  MEWS Score 2

## 2022-11-19 NOTE — Plan of Care (Signed)
  Problem: Education: Goal: Knowledge of General Education information will improve Description: Including pain rating scale, medication(s)/side effects and non-pharmacologic comfort measures 11/19/2022 0535 by , mabih, RN Outcome: Progressing 11/19/2022 0053 by , mabih, RN Outcome: Progressing   Problem: Clinical Measurements: Goal: Cardiovascular complication will be avoided 11/19/2022 0535 by , mabih, RN Outcome: Progressing 11/19/2022 0053 by , mabih, RN Outcome: Progressing

## 2022-11-19 NOTE — Progress Notes (Signed)
Pt refused interventions for elevated HR att his time. MD on call aware

## 2022-11-19 NOTE — Plan of Care (Signed)
  Problem: Education: Goal: Knowledge of General Education information will improve Description: Including pain rating scale, medication(s)/side effects and non-pharmacologic comfort measures Outcome: Progressing   Problem: Health Behavior/Discharge Planning: Goal: Ability to manage health-related needs will improve Outcome: Progressing   Problem: Coping: Goal: Level of anxiety will decrease Outcome: Progressing   Problem: Elimination: Goal: Will not experience complications related to bowel motility Outcome: Progressing Goal: Will not experience complications related to urinary retention Outcome: Progressing   Problem: Pain Managment: Goal: General experience of comfort will improve Outcome: Progressing   Problem: Safety: Goal: Ability to remain free from injury will improve Outcome: Progressing

## 2022-11-19 NOTE — Progress Notes (Signed)
PROGRESS NOTE    [name]  GLO:[id number] DOB: 12-05-1975 DOA: 11/18/2022 PCP: Courtney Bowl, MD   Brief Narrative: 47 year old with past medical history significant for recent small bowel obstruction 6//20 04/2022, breast cancer in remission, Crohn's disease status post subtotal colectomy 2021, grade 1 diastolic dysfunction, presents complaining of poor oral intake for the last 2 or 3 weeks.  Completed chemotherapy 6 weeks ago.  Reported bowel movement which are irregular and the stools are pebble-like.  Reported abdominal pain.  CT abdomen and pelvis showed status post subtotal colectomy with MAC long segment small bowel dilation demonstrating mild-moderate thickening, similar to 09/27/2022 with abrupt caliber transition near the ileal sigmoid anastomosis, suspicious for small bowel obstruction.   Assessment & Plan:   Principal Problem:   SBO (small bowel obstruction) (HCC) Active Problems:   History of abdominal subtotal colectomy   History of Crohn's disease   Chronic disease anemia   Crohn's disease of small and large intestines with complication (HCC)   Hypokalemia   Small bowel obstruction (HCC)   1-Small bowel obstruction History of Crohn's disease Status post subtotal colectomy 2021 -Patient presented with abdominal pain, poor oral intake, constipation.  CT scanning with concern for SBO. -CT abdomen: Status post subtotal colectomy with marked long segment small bowel dilation demonstrating mild mural thickening, similar to 09/27/2022, with abrupt caliber transition near the ileosigmoid anastomosis, suspicious for small-bowel obstruction. -General Surgery consulted -GI consulted -N.p.o., continue with IV fluids   Hypokalemia: -she will received 2 more runs today.    Hypomagnesemia: -Replete IV>   Chronic constipation: -defer Bowel regimen to GI  Breast cancer; Under the care of of Dr. Pamelia Norris.  Last chemotherapy 6 weeks ago   Grade 1 diastolic  dysfunction -Monitor volume   Sinus tachycardia: -in setting hypovolemia, dehydration.  -IV bolus.  -continue with IV fluids.  -CTA chest negative for PE  Oral thrush:  Start Nystatin.         Estimated body mass index is 18.88 kg/m as calculated from the following:   Height as of this encounter: 5\' 4"  (1.626 m).   Weight as of this encounter: 49.9 kg.   DVT prophylaxis: hold heparin for possible scope.  Code Status: Full code Family Communication: Care discussed with patient.  Disposition Plan:  Status is: Inpatient Remains inpatient appropriate because: management of SBP, dehydration.     Consultants:  General Sx GI  Procedures:    Antimicrobials:    Subjective: She is alert, conversant. Had small BM hard.  She report poor oral intake.   Objective: Vitals:   11/18/22 2245 11/18/22 2316 11/19/22 0025 11/19/22 0504  BP: 116/85 (!) 126/91 (!) 127/91 112/82  Pulse: (!) 114 (!) 117 (!) 109 (!) 107  Resp: 16 20 20 18   Temp: 98.7 F (37.1 C) 98.6 F (37 C) 98.4 F (36.9 C) 98.9 F (37.2 C)  TempSrc: Oral Oral Oral Oral  SpO2: 100% 100% 99% 99%  Weight:      Height:        Intake/Output Summary (Last 24 hours) at 11/19/2022 0743 Last data filed at 11/19/2022 0504 Gross per 24 hour  Intake 1233.91 ml  Output 400 ml  Net 833.91 ml   Filed Weights   11/18/22 1551  Weight: 49.9 kg    Examination:  General exam: Appears calm and comfortable  Respiratory system: Clear to auscultation. Respiratory effort normal. Cardiovascular system: S1 & S2 heard, RRR.  Gastrointestinal system: Abdomen is nondistended, soft and nontender. Central  nervous system: Alert and oriented. Extremities: Symmetric 5 x 5 power.   Data Reviewed: I have personally reviewed following labs and imaging studies  CBC: Recent Labs  Lab 11/18/22 1707  WBC 8.8  NEUTROABS 6.0  HGB 10.1*  HCT 30.6*  MCV 76.7*  PLT 391   Basic Metabolic Panel: Recent Labs  Lab  11/18/22 1707 11/19/22 0044  NA 137  --   K 3.2*  --   CL 101  --   CO2 25  --   GLUCOSE 88  --   BUN 10  --   CREATININE 0.61  --   CALCIUM 8.4*  --   MG  --  1.8   GFR: Estimated Creatinine Clearance: 70.7 mL/min (by C-G formula based on SCr of 0.61 mg/dL). Liver Function Tests: Recent Labs  Lab 11/18/22 1707  AST 9*  ALT 6  ALKPHOS 106  BILITOT 0.6  PROT 5.9*  ALBUMIN 2.9*   Recent Labs  Lab 11/18/22 1707  LIPASE 11   No results for input(s): "AMMONIA" in the last 168 hours. Coagulation Profile: No results for input(s): "INR", "PROTIME" in the last 168 hours. Cardiac Enzymes: No results for input(s): "CKTOTAL", "CKMB", "CKMBINDEX", "TROPONINI" in the last 168 hours. BNP (last 3 results) No results for input(s): "PROBNP" in the last 8760 hours. HbA1C: No results for input(s): "HGBA1C" in the last 72 hours. CBG: No results for input(s): "GLUCAP" in the last 168 hours. Lipid Profile: No results for input(s): "CHOL", "HDL", "LDLCALC", "TRIG", "CHOLHDL", "LDLDIRECT" in the last 72 hours. Thyroid Function Tests: No results for input(s): "TSH", "T4TOTAL", "FREET4", "T3FREE", "THYROIDAB" in the last 72 hours. Anemia Panel: No results for input(s): "VITAMINB12", "FOLATE", "FERRITIN", "TIBC", "IRON", "RETICCTPCT" in the last 72 hours. Sepsis Labs: Recent Labs  Lab 11/18/22 1707  LATICACIDVEN 0.6    No results found for this or any previous visit (from the past 240 hour(s)).       Radiology Studies: CT ABDOMEN PELVIS W CONTRAST  Result Date: 11/18/2022 CLINICAL DATA:  History of breast cancer on chemotherapy with loss of appetite. * Tracking Code: BO * EXAM: CT ANGIOGRAPHY CHEST CT ABDOMEN AND PELVIS WITH CONTRAST TECHNIQUE: Multidetector CT imaging of the chest was performed using the standard protocol during bolus administration of intravenous contrast. Multiplanar CT image reconstructions and MIPs were obtained to evaluate the vascular anatomy. Multidetector  CT imaging of the abdomen and pelvis was performed using the standard protocol during bolus administration of intravenous contrast. RADIATION DOSE REDUCTION: This exam was performed according to the departmental dose-optimization program which includes automated exposure control, adjustment of the mA and/or kV according to patient size and/or use of iterative reconstruction technique. CONTRAST:  OMNIPAQUE IOHEXOL 300 MG/ML  SOLN COMPARISON:  CT abdomen and pelvis dated 09/27/2022, CT chest dated 04/06/2022 FINDINGS: CTA CHEST FINDINGS Cardiovascular: Left chest wall port terminates in the right atrium. The study is high quality for the evaluation of pulmonary embolism. There are no filling defects in the central, lobar, segmental or subsegmental pulmonary artery branches to suggest acute pulmonary embolism. Anatomic variant common origin of the brachiocephalic and common carotid arteries. Normal heart size. No significant pericardial fluid/thickening. Mediastinum/Nodes: Imaged thyroid gland without nodules meeting criteria for imaging follow-up by size. Normal esophagus. No pathologically enlarged axillary, supraclavicular, mediastinal, or hilar lymph nodes. Lungs/Pleura: The central airways are patent. Unchanged subpleural ground-glass densities along the anterior right upper and middle lobe. Unchanged small area of perifissural ground-glass opacity is also seen in the anterior  right lower lobe. Unchanged 3 mm basilar left lower lobe nodule (6:88). No pneumothorax. No pleural effusion. Musculoskeletal: No acute or abnormal lytic or blastic osseous lesions. Right mastectomy. Left breast implant. Review of the MIP images confirms the above findings. CT ABDOMEN and PELVIS FINDINGS Hepatobiliary: Subcentimeter hypoattenuating focus in segment 5 (2:23) too small to characterize but unchanged. No intra or extrahepatic biliary ductal dilation. Normal gallbladder. Pancreas: No focal lesions or main ductal dilation.  Spleen: Normal in size without focal abnormality. Adrenals/Urinary Tract: No adrenal nodules. No suspicious renal mass, calculi, or hydronephrosis. No focal bladder wall thickening. Stomach/Bowel: Normal appearance of the stomach. Status post subtotal colectomy with ileosigmoid anastomosis. Marked long segment small bowel dilation demonstrating mild mural thickening, similar to 09/27/2022, with abrupt caliber transition near the ileosigmoid anastomosis (5:51). Vascular/Lymphatic: No significant vascular findings are present. No enlarged abdominal or pelvic lymph nodes. Reproductive: Enlarged and lobulated uterus containing multifocal enhancing masses, including a few with coarse calcifications. No adnexal masses. Other: No free fluid, fluid collection, or free air. Musculoskeletal: No acute or abnormal lytic or blastic osseous lesions. IMPRESSION: 1. No evidence of pulmonary embolism. 2. Status post subtotal colectomy with marked long segment small bowel dilation demonstrating mild mural thickening, similar to 09/27/2022, with abrupt caliber transition near the ileosigmoid anastomosis, suspicious for small-bowel obstruction. 3. Unchanged subpleural ground-glass densities along the anterior right upper and middle lobe, likely postradiation change. 4. 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 5. Unchanged 3 mm basilar left lower lobe nodule. Electronically Signed   By: Agustin Cree M.D.   On: 11/18/2022 18:33   CT Angio Chest PE W/Cm &/Or Wo Cm  Result Date: 11/18/2022 CLINICAL DATA:  History of breast cancer on chemotherapy with loss of appetite. * Tracking Code: BO * EXAM: CT ANGIOGRAPHY CHEST CT ABDOMEN AND PELVIS WITH CONTRAST TECHNIQUE: Multidetector CT imaging of the chest was performed using the standard protocol during bolus administration of intravenous contrast. Multiplanar CT image reconstructions and MIPs  were obtained to evaluate the vascular anatomy. Multidetector CT imaging of the abdomen and pelvis was performed using the standard protocol during bolus administration of intravenous contrast. RADIATION DOSE REDUCTION: This exam was performed according to the departmental dose-optimization program which includes automated exposure control, adjustment of the mA and/or kV according to patient size and/or use of iterative reconstruction technique. CONTRAST:  OMNIPAQUE IOHEXOL 300 MG/ML  SOLN COMPARISON:  CT abdomen and pelvis dated 09/27/2022, CT chest dated 04/06/2022 FINDINGS: CTA CHEST FINDINGS Cardiovascular: Left chest wall port terminates in the right atrium. The study is high quality for the evaluation of pulmonary embolism. There are no filling defects in the central, lobar, segmental or subsegmental pulmonary artery branches to suggest acute pulmonary embolism. Anatomic variant common origin of the brachiocephalic and common carotid arteries. Normal heart size. No significant pericardial fluid/thickening. Mediastinum/Nodes: Imaged thyroid gland without nodules meeting criteria for imaging follow-up by size. Normal esophagus. No pathologically enlarged axillary, supraclavicular, mediastinal, or hilar lymph nodes. Lungs/Pleura: The central airways are patent. Unchanged subpleural ground-glass densities along the anterior right upper and middle lobe. Unchanged small area of perifissural ground-glass opacity is also seen in the anterior right lower lobe. Unchanged 3 mm basilar left lower lobe nodule (6:88). No pneumothorax. No pleural effusion. Musculoskeletal: No acute or abnormal lytic or blastic osseous lesions. Right mastectomy. Left breast implant. Review of the MIP images confirms the above findings. CT ABDOMEN and PELVIS FINDINGS Hepatobiliary: Subcentimeter  hypoattenuating focus in segment 5 (2:23) too small to characterize but unchanged. No intra or extrahepatic biliary ductal dilation. Normal  gallbladder. Pancreas: No focal lesions or main ductal dilation. Spleen: Normal in size without focal abnormality. Adrenals/Urinary Tract: No adrenal nodules. No suspicious renal mass, calculi, or hydronephrosis. No focal bladder wall thickening. Stomach/Bowel: Normal appearance of the stomach. Status post subtotal colectomy with ileosigmoid anastomosis. Marked long segment small bowel dilation demonstrating mild mural thickening, similar to 09/27/2022, with abrupt caliber transition near the ileosigmoid anastomosis (5:51). Vascular/Lymphatic: No significant vascular findings are present. No enlarged abdominal or pelvic lymph nodes. Reproductive: Enlarged and lobulated uterus containing multifocal enhancing masses, including a few with coarse calcifications. No adnexal masses. Other: No free fluid, fluid collection, or free air. Musculoskeletal: No acute or abnormal lytic or blastic osseous lesions. IMPRESSION: 1. No evidence of pulmonary embolism. 2. Status post subtotal colectomy with marked long segment small bowel dilation demonstrating mild mural thickening, similar to 09/27/2022, with abrupt caliber transition near the ileosigmoid anastomosis, suspicious for small-bowel obstruction. 3. Unchanged subpleural ground-glass densities along the anterior right upper and middle lobe, likely postradiation change. 4. 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 5. Unchanged 3 mm basilar left lower lobe nodule. Electronically Signed   By: Agustin Cree M.D.   On: 11/18/2022 18:33        Scheduled Meds:  Chlorhexidine Gluconate Cloth  6 each Topical Daily   heparin  5,000 Units Subcutaneous Q8H   ondansetron (ZOFRAN) IV  4 mg Intravenous Once   sodium chloride flush  3 mL Intravenous Q12H   Continuous Infusions:  sodium chloride     lactated ringers 1,000 mL with potassium chloride 10 mEq infusion 100 mL/hr at  11/19/22 0351     LOS: 1 day    Time spent: 35 minutes     A , MD [facility]   If 7PM-7AM, please contact night-coverage [email or web address]  11/19/2022, 7:43 AM

## 2022-11-19 NOTE — Progress Notes (Signed)
   11/19/22 2210  Assess: MEWS Score  Temp 98.8 F (37.1 C)  BP (!) 122/94  MAP (mmHg) 103  Pulse Rate (!) 118  Resp 16  SpO2 100 %  O2 Device Room Air  Assess: MEWS Score  MEWS Temp 0  MEWS Systolic 0  MEWS Pulse 2  MEWS RR 0  MEWS LOC 0  MEWS Score 2  MEWS Score Color Yellow  Assess: SIRS CRITERIA  SIRS Temperature  0  SIRS Pulse 1  SIRS Respirations  0  SIRS WBC 0  SIRS Score Sum  1     11/19/22 2255  Assess: if the MEWS score is Yellow or Red  Were vital signs accurate and taken at a resting state? Yes  Does the patient meet 2 or more of the SIRS criteria? No  MEWS guidelines implemented  Yes, yellow  Treat  MEWS Interventions Considered administering scheduled or prn medications/treatments as ordered  Take Vital Signs  Increase Vital Sign Frequency  Yellow: Q2hr x1, continue Q4hrs until patient remains green for 12hrs  Escalate  MEWS: Escalate Yellow: Discuss with charge nurse and consider notifying provider and/or RRT  Notify: Charge Nurse/RN  Name of Charge Nurse/RN Notified Nellie, RN  Provider Notification  Provider Name/Title Chinita Greenland, NP  Date Provider Notified 11/19/22  Time Provider Notified 2212  Method of Notification Page  Notification Reason Other (Comment) (yellow MEWS)  Provider response No new orders  Date of Provider Response 11/19/22  Time of Provider Response 2213

## 2022-11-19 NOTE — H&P (Addendum)
History and Physical    [name]:[id number] DOB: 1975/07/18 DOA: 11/18/2022  PCP: Ollen Bowl, MD   Patient coming from: Home   Chief Complaint:  Chief Complaint  Patient presents with   Weight Loss    HPI:  [name] is a 47 y.o. female with medical history significant of recent small bowel obstruction in 09/29/2022 obstruction resolved spontaneously, breast cancer on remission, Crohn's disease s/p subtotal colectomy 2021 and grade 1 diastolic heart failure preserved EF since February 2024 (patient developed diastolic heart failure after chemotherapy in 2023) again coming to the emergency department at [facility] with complaining of decreased oral intake for last 2 to 3 weeks.  Patient recently has completed chemotherapy 6 weeks ago since then she has significant weight loss and poor appetite.  Reported bowel movement which is irregular and stool are pebble-like.  Denies any fever, chill, chest pain, palpitation or shortness of breath.    Patient reported midepigastric and left-sided upper abdominal pain 3 out of 10 intensity.  Denies any associated nausea and vomiting.  Patient able to pass gas and she has bowel movement at 10 PM tonight 11/18/2022.  Workup in the ED showed small bowel obstruction and case has been discussed with Dr. Arlean Hopping, patient has been accepted to admit to [facility] further evaluation and management of SBO.  During last hospital admission in 6/19 to 6/21 with nausea vomiting workup revealed small bowel obstruction which resolved spontaneously.  General surgery discussed with gastroenterology to follow-up outpatient with GI given patient has unclear etiology of SBO and abnormal flex sigmoidoscopy in May 2023 showed moderate stenosis at the side-to-side ileocolonic anastomosis.  Patient has been last evaluated in the GI clinic on May 2024 with a history of Crohn's colitis and small bowel disease.  She was diagnosed with Crohn's  disease in 2008 initially treated with mesalamine.  In 2021 developed a sigmoid stricture not able to pass the scope and questionable mass in the right colon and underwent subtotal colectomy by Dr. Maisie Fus in March 2021.   ED Course:  At presentation to ED patient found tachycardic 141, blood pressure 122/97, respiratory rate 18 and O2 sat 98% room air.  Heart rate has been improved 141 to 117 with IV fluid resuscitation in the ED.  CBC unremarkable except evidence of chronic anemia hemoglobin 8.1, hematocrit 36.  Normal platelet count 391 and normal WBC 8.8.  CMP showed sodium 137, low potassium 3.2, chloride 101, bicarb 25, blood glucose 88, BUN 10, creatinine 0.61, normal AST/ALT/ALP, normal bilirubin and GFR above 60.  Normal lipase 17.  CT abdomen pelvis showed Status post subtotal colectomy with marked long segment small bowel dilation demonstrating mild mural thickening, similar to 09/27/2022, with abrupt caliber transition near the ileosigmoid anastomosis, suspicious for small-bowel obstruction.Enlarged and lobulated uterus containing multifocal enhancing masses, including a few with coarse calcifications, likely reflecting leiomyomas.  CTA chest no evidence of pulmonary embolism.  Hospitalist has been consulted for further management of SBO.  Review of Systems:  Review of Systems  Constitutional:  Positive for malaise/fatigue and weight loss. Negative for chills and fever.  Respiratory:  Negative for cough and shortness of breath.   Cardiovascular:  Negative for chest pain.  Gastrointestinal:  Positive for abdominal pain and constipation. Negative for diarrhea, heartburn, nausea and vomiting.  Genitourinary:  Negative for dysuria and urgency.  Musculoskeletal:  Negative for myalgias and neck pain.  Neurological:  Negative for dizziness and headaches.  Psychiatric/Behavioral:  The  patient is not nervous/anxious.     Past Medical History:  Diagnosis Date   Cancer [facility])     Colon stricture (HCC) 05/07/2019   Crohn's colitis, with intestinal obstruction (HCC) 09/17/2013   Dx 2006 - right and left colon involved 2011 - pan-colitis 09/17/2013 left colitis 60-20 cm March 2021 subtotal colectomy ileosigmoid anastomosis for colonic strictures-Dr. Maisie Fus     Crohn's disease [facility])    Family history of breast cancer 06/11/2020   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 10/17/2010   Rectal bleeding    Uterine fibroid    Vitamin D deficiency 09/24/2013    Past Surgical History:  Procedure Laterality Date   AXILLARY LYMPH NODE DISSECTION Right 11/09/2020   Procedure: RIGHT AXILLARY LYMPH NODE DISSECTION;  Surgeon: Emelia Loron, MD;  Location: [facility];  Service: General;  Laterality: Right;   BIOPSY  07/01/2020   Procedure: BIOPSY;  Surgeon: Benancio Deeds, MD;  Location: [facility];  Service: Gastroenterology;;   BIOPSY  01/27/2021   Procedure: BIOPSY;  Surgeon: Benancio Deeds, MD;  Location: [facility];  Service: Gastroenterology;;   BIOPSY  09/07/2021   Procedure: BIOPSY;  Surgeon: Lynann Bologna, MD;  Location: [facility];  Service: Gastroenterology;;   BREAST BIOPSY Right 03/24/2022   [place] RT BREAST BX W LOC DEV 1ST LESION IMG BX SPEC US GUIDE 03/24/2022 [facility]   BREAST BIOPSY  04/28/2022   [place] RT RADIOACTIVE SEED LOC 04/28/2022 [facility]   BREAST IMPLANT REMOVAL Right 08/07/2022   Procedure: REMOVAL  OF RIGHT BREAST IMPLANTS;  Surgeon: Allena Napoleon, MD;  Location: [facility];  Service: Plastics;  Laterality: Right;   BREAST RECONSTRUCTION WITH PLACEMENT OF TISSUE EXPANDER AND FLEX HD (ACELLULAR HYDRATED DERMIS) Bilateral 11/09/2020   Procedure: BILATERAL BREAST RECONSTRUCTION WITH PLACEMENT OF TISSUE EXPANDER AND FLEX HD (ACELLULAR HYDRATED DERMIS);  Surgeon: Allena Napoleon, MD;  Location: [facility];  Service: Plastics;  Laterality: Bilateral;    CAPSULOTOMY Bilateral 07/25/2021   Procedure: CAPSULOTOMY;  Surgeon: Allena Napoleon, MD;  Location: [facility];  Service: Plastics;  Laterality: Bilateral;   COLECTOMY  06/27/2019   COLONOSCOPY  2015   FLEXIBLE SIGMOIDOSCOPY N/A 07/01/2020   Procedure: FLEXIBLE SIGMOIDOSCOPY;  Surgeon: Benancio Deeds, MD;  Location: [facility];  Service: Gastroenterology;  Laterality: N/A;   FLEXIBLE SIGMOIDOSCOPY N/A 01/27/2021   Procedure: FLEXIBLE SIGMOIDOSCOPY;  Surgeon: Benancio Deeds, MD;  Location: [facility];  Service: Gastroenterology;  Laterality: N/A;   FLEXIBLE SIGMOIDOSCOPY N/A 09/07/2021   Procedure: FLEXIBLE SIGMOIDOSCOPY;  Surgeon: Lynann Bologna, MD;  Location: [facility];  Service: Gastroenterology;  Laterality: N/A;   NIPPLE SPARING MASTECTOMY Bilateral 11/09/2020   Procedure: BILATERAL NIPPLE SPARING MASTECTOMY;  Surgeon: Emelia Loron, MD;  Location: [facility];  Service: General;  Laterality: Bilateral;   PORT-A-CATH REMOVAL N/A 07/25/2021   Procedure: REMOVAL PORT-A-CATH;  Surgeon: Allena Napoleon, MD;  Location: [facility];  Service: Plastics;  Laterality: N/A;   PORTACATH PLACEMENT N/A 06/23/2020   Procedure: INSERTION PORT-A-CATH;  Surgeon: Emelia Loron, MD;  Location: [facility];  Service: General;  Laterality: N/A;  START TIME OF 11:00 AM FOR 60 MINUTES [place] [facility]   PORTACATH PLACEMENT Left 05/02/2022   Procedure: INSERTION PORT-A-CATH;  Surgeon: Emelia Loron, MD;  Location: [facility];  Service: General;  Laterality: Left;   RADIOACTIVE SEED GUIDED EXCISIONAL BREAST BIOPSY Right 05/02/2022  Procedure: RADIOACTIVE SEED GUIDED RIGHT BREAST MASS EXCISION;  Surgeon: Emelia Loron, MD;  Location: [facility];  Service: General;  Laterality: Right;   REMOVAL OF BILATERAL TISSUE EXPANDERS WITH PLACEMENT OF BILATERAL BREAST IMPLANTS Bilateral 07/25/2021   Procedure:  REMOVAL OF BILATERAL TISSUE EXPANDERS WITH PLACEMENT OF BILATERAL BREAST IMPLANTS;  Surgeon: Allena Napoleon, MD;  Location: [facility];  Service: Plastics;  Laterality: Bilateral;  1.5     reports that she has quit smoking. Her smoking use included cigarettes. She has never used smokeless tobacco. She reports current alcohol use of about 1.0 - 2.0 standard drink of alcohol per week. She reports that she does not use drugs.  Allergies  Allergen Reactions   Nsaids Other (See Comments)    Crohns disease/ IBD   Other     BLOOD PRODUCT REFUSAL    Family History  Problem Relation Age of Onset   Diabetes Father    Breast cancer Maternal Aunt        dx unknown age   Diabetes Paternal Aunt    Breast cancer Cousin        maternal cousin; dx unknown age   Esophageal cancer Neg Hx    Rectal cancer Neg Hx    Stomach cancer Neg Hx    Colon polyps Neg Hx    Colon cancer Neg Hx     Prior to Admission medications   Medication Sig Start Date End Date Taking? Authorizing Provider  acetaminophen (TYLENOL) 500 MG tablet Take 500-1,000 mg by mouth every 6 (six) hours as needed for mild pain.    [provider]  Multiple Vitamins-Minerals (MULTIVITAMIN WITH MINERALS) tablet Take 1 tablet by mouth daily.    [provider]  potassium chloride SA (KLOR-CON M) 20 MEQ tablet Take 1 tablet (20 mEq total) by mouth daily. 12/27/21   Iva Boop, MD     Physical Exam: Vitals:   11/18/22 2000 11/18/22 2245 11/18/22 2316 11/19/22 0025  BP:  116/85 (!) 126/91 (!) 127/91  Pulse: (!) 114 (!) 114 (!) 117 (!) 109  Resp: (!) 22 16 20 20   Temp:  98.7 F (37.1 C) 98.6 F (37 C) 98.4 F (36.9 C)  TempSrc:  Oral Oral Oral  SpO2: 100% 100% 100% 99%  Weight:      Height:        Physical Exam Constitutional:      Appearance: She is ill-appearing.  HENT:     Head: Normocephalic.     Nose: Nose normal.     Mouth/Throat:     Mouth: Mucous membranes are moist.  Eyes:      Pupils: Pupils are equal, round, and reactive to light.  Cardiovascular:     Rate and Rhythm: Tachycardia present.     Pulses: Normal pulses.     Heart sounds: Normal heart sounds.  Pulmonary:     Effort: Pulmonary effort is normal.     Breath sounds: Normal breath sounds.  Abdominal:     General: There is no distension.     Palpations: Abdomen is soft. There is no mass.     Tenderness: There is no abdominal tenderness. There is no guarding or rebound.     Comments: Increased bowel sound  Musculoskeletal:     Right lower leg: No edema.     Left lower leg: No edema.  Skin:    General: Skin is dry.  Neurological:     Mental Status: She is alert  and oriented to person, place, and time.  Psychiatric:        Mood and Affect: Mood normal.        Thought Content: Thought content normal.        Judgment: Judgment normal.      Labs on Admission: I have personally reviewed following labs and imaging studies  CBC: Recent Labs  Lab 11/18/22 1707  WBC 8.8  NEUTROABS 6.0  HGB 10.1*  HCT 30.6*  MCV 76.7*  PLT 391   Basic Metabolic Panel: Recent Labs  Lab 11/18/22 1707 11/19/22 0044  NA 137  --   K 3.2*  --   CL 101  --   CO2 25  --   GLUCOSE 88  --   BUN 10  --   CREATININE 0.61  --   CALCIUM 8.4*  --   MG  --  1.8   GFR: Estimated Creatinine Clearance: 70.7 mL/min (by C-G formula based on SCr of 0.61 mg/dL). Liver Function Tests: Recent Labs  Lab 11/18/22 1707  AST 9*  ALT 6  ALKPHOS 106  BILITOT 0.6  PROT 5.9*  ALBUMIN 2.9*   Recent Labs  Lab 11/18/22 1707  LIPASE 11   No results for input(s): "AMMONIA" in the last 168 hours. Coagulation Profile: No results for input(s): "INR", "PROTIME" in the last 168 hours. Cardiac Enzymes: No results for input(s): "CKTOTAL", "CKMB", "CKMBINDEX", "TROPONINI", "TROPONINIHS" in the last 168 hours. BNP (last 3 results) No results for input(s): "BNP" in the last 8760 hours. HbA1C: No results for input(s): "HGBA1C"  in the last 72 hours. CBG: No results for input(s): "GLUCAP" in the last 168 hours. Lipid Profile: No results for input(s): "CHOL", "HDL", "LDLCALC", "TRIG", "CHOLHDL", "LDLDIRECT" in the last 72 hours. Thyroid Function Tests: No results for input(s): "TSH", "T4TOTAL", "FREET4", "T3FREE", "THYROIDAB" in the last 72 hours. Anemia Panel: No results for input(s): "VITAMINB12", "FOLATE", "FERRITIN", "TIBC", "IRON", "RETICCTPCT" in the last 72 hours. Urine analysis:    Component Value Date/Time   COLORURINE YELLOW 09/27/2022 1849   APPEARANCEUR CLEAR 09/27/2022 1849   LABSPEC 1.017 09/27/2022 1849   PHURINE 5.0 09/27/2022 1849   GLUCOSEU NEGATIVE 09/27/2022 1849   HGBUR NEGATIVE 09/27/2022 1849   BILIRUBINUR NEGATIVE 09/27/2022 1849   KETONESUR 20 (A) 09/27/2022 1849   PROTEINUR NEGATIVE 09/27/2022 1849   UROBILINOGEN 0.2 06/24/2007 0321   NITRITE NEGATIVE 09/27/2022 1849   LEUKOCYTESUR NEGATIVE 09/27/2022 1849    Radiological Exams on Admission: I have personally reviewed images CT ABDOMEN PELVIS W CONTRAST  Result Date: 11/18/2022 CLINICAL DATA:  History of breast cancer on chemotherapy with loss of appetite. * Tracking Code: BO * EXAM: CT ANGIOGRAPHY CHEST CT ABDOMEN AND PELVIS WITH CONTRAST TECHNIQUE: Multidetector CT imaging of the chest was performed using the standard protocol during bolus administration of intravenous contrast. Multiplanar CT image reconstructions and MIPs were obtained to evaluate the vascular anatomy. Multidetector CT imaging of the abdomen and pelvis was performed using the standard protocol during bolus administration of intravenous contrast. RADIATION DOSE REDUCTION: This exam was performed according to the departmental dose-optimization program which includes automated exposure control, adjustment of the mA and/or kV according to patient size and/or use of iterative reconstruction technique. CONTRAST:  OMNIPAQUE IOHEXOL 300 MG/ML  SOLN COMPARISON:  CT  abdomen and pelvis dated 09/27/2022, CT chest dated 04/06/2022 FINDINGS: CTA CHEST FINDINGS Cardiovascular: Left chest wall port terminates in the right atrium. The study is high quality for the evaluation of pulmonary embolism.  There are no filling defects in the central, lobar, segmental or subsegmental pulmonary artery branches to suggest acute pulmonary embolism. Anatomic variant common origin of the brachiocephalic and common carotid arteries. Normal heart size. No significant pericardial fluid/thickening. Mediastinum/Nodes: Imaged thyroid gland without nodules meeting criteria for imaging follow-up by size. Normal esophagus. No pathologically enlarged axillary, supraclavicular, mediastinal, or hilar lymph nodes. Lungs/Pleura: The central airways are patent. Unchanged subpleural ground-glass densities along the anterior right upper and middle lobe. Unchanged small area of perifissural ground-glass opacity is also seen in the anterior right lower lobe. Unchanged 3 mm basilar left lower lobe nodule (6:88). No pneumothorax. No pleural effusion. Musculoskeletal: No acute or abnormal lytic or blastic osseous lesions. Right mastectomy. Left breast implant. Review of the MIP images confirms the above findings. CT ABDOMEN and PELVIS FINDINGS Hepatobiliary: Subcentimeter hypoattenuating focus in segment 5 (2:23) too small to characterize but unchanged. No intra or extrahepatic biliary ductal dilation. Normal gallbladder. Pancreas: No focal lesions or main ductal dilation. Spleen: Normal in size without focal abnormality. Adrenals/Urinary Tract: No adrenal nodules. No suspicious renal mass, calculi, or hydronephrosis. No focal bladder wall thickening. Stomach/Bowel: Normal appearance of the stomach. Status post subtotal colectomy with ileosigmoid anastomosis. Marked long segment small bowel dilation demonstrating mild mural thickening, similar to 09/27/2022, with abrupt caliber transition near the ileosigmoid anastomosis  (5:51). Vascular/Lymphatic: No significant vascular findings are present. No enlarged abdominal or pelvic lymph nodes. Reproductive: Enlarged and lobulated uterus containing multifocal enhancing masses, including a few with coarse calcifications. No adnexal masses. Other: No free fluid, fluid collection, or free air. Musculoskeletal: No acute or abnormal lytic or blastic osseous lesions. IMPRESSION: 1. No evidence of pulmonary embolism. 2. Status post subtotal colectomy with marked long segment small bowel dilation demonstrating mild mural thickening, similar to 09/27/2022, with abrupt caliber transition near the ileosigmoid anastomosis, suspicious for small-bowel obstruction. 3. Unchanged subpleural ground-glass densities along the anterior right upper and middle lobe, likely postradiation change. 4. 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 5. Unchanged 3 mm basilar left lower lobe nodule. Electronically Signed   By: Agustin Cree M.D.   On: 11/18/2022 18:33   CT Angio Chest PE W/Cm &/Or Wo Cm  Result Date: 11/18/2022 CLINICAL DATA:  History of breast cancer on chemotherapy with loss of appetite. * Tracking Code: BO * EXAM: CT ANGIOGRAPHY CHEST CT ABDOMEN AND PELVIS WITH CONTRAST TECHNIQUE: Multidetector CT imaging of the chest was performed using the standard protocol during bolus administration of intravenous contrast. Multiplanar CT image reconstructions and MIPs were obtained to evaluate the vascular anatomy. Multidetector CT imaging of the abdomen and pelvis was performed using the standard protocol during bolus administration of intravenous contrast. RADIATION DOSE REDUCTION: This exam was performed according to the departmental dose-optimization program which includes automated exposure control, adjustment of the mA and/or kV according to patient size and/or use of iterative reconstruction technique.  CONTRAST:  OMNIPAQUE IOHEXOL 300 MG/ML  SOLN COMPARISON:  CT abdomen and pelvis dated 09/27/2022, CT chest dated 04/06/2022 FINDINGS: CTA CHEST FINDINGS Cardiovascular: Left chest wall port terminates in the right atrium. The study is high quality for the evaluation of pulmonary embolism. There are no filling defects in the central, lobar, segmental or subsegmental pulmonary artery branches to suggest acute pulmonary embolism. Anatomic variant common origin of the brachiocephalic and common carotid arteries. Normal heart size. No significant pericardial fluid/thickening. Mediastinum/Nodes: Imaged thyroid gland without nodules meeting criteria for imaging  follow-up by size. Normal esophagus. No pathologically enlarged axillary, supraclavicular, mediastinal, or hilar lymph nodes. Lungs/Pleura: The central airways are patent. Unchanged subpleural ground-glass densities along the anterior right upper and middle lobe. Unchanged small area of perifissural ground-glass opacity is also seen in the anterior right lower lobe. Unchanged 3 mm basilar left lower lobe nodule (6:88). No pneumothorax. No pleural effusion. Musculoskeletal: No acute or abnormal lytic or blastic osseous lesions. Right mastectomy. Left breast implant. Review of the MIP images confirms the above findings. CT ABDOMEN and PELVIS FINDINGS Hepatobiliary: Subcentimeter hypoattenuating focus in segment 5 (2:23) too small to characterize but unchanged. No intra or extrahepatic biliary ductal dilation. Normal gallbladder. Pancreas: No focal lesions or main ductal dilation. Spleen: Normal in size without focal abnormality. Adrenals/Urinary Tract: No adrenal nodules. No suspicious renal mass, calculi, or hydronephrosis. No focal bladder wall thickening. Stomach/Bowel: Normal appearance of the stomach. Status post subtotal colectomy with ileosigmoid anastomosis. Marked long segment small bowel dilation demonstrating mild mural thickening, similar to  09/27/2022, with abrupt caliber transition near the ileosigmoid anastomosis (5:51). Vascular/Lymphatic: No significant vascular findings are present. No enlarged abdominal or pelvic lymph nodes. Reproductive: Enlarged and lobulated uterus containing multifocal enhancing masses, including a few with coarse calcifications. No adnexal masses. Other: No free fluid, fluid collection, or free air. Musculoskeletal: No acute or abnormal lytic or blastic osseous lesions. IMPRESSION: 1. No evidence of pulmonary embolism. 2. Status post subtotal colectomy with marked long segment small bowel dilation demonstrating mild mural thickening, similar to 09/27/2022, with abrupt caliber transition near the ileosigmoid anastomosis, suspicious for small-bowel obstruction. 3. Unchanged subpleural ground-glass densities along the anterior right upper and middle lobe, likely postradiation change. 4. 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 5. Unchanged 3 mm basilar left lower lobe nodule. Electronically Signed   By: Agustin Cree M.D.   On: 11/18/2022 18:33    EKG: My personal interpretation of EKG shows: Sinus tachycardia heart rate 128.  No ST and T wave abnormality.     Assessment/Plan: Principal Problem:   SBO (small bowel obstruction) (HCC) Active Problems:   History of colectomy   History of Crohn's disease   Chronic disease anemia   Hypokalemia   Small bowel obstruction (HCC)    Assessment and Plan: Small bowel obstruction History of Crohn's disease History of Crohn's colitis s/p subtotal colectomy 2021 Constipation - Patient coming with complaining of poor appetite, mild midepigastric abdominal pain 3 out of 8 intensity and constipation for last 1 to 2 weeks which has been progressively getting worse.  Patient unable to pass gas and she has bowel movement around 10 PM 11/18/2022.  Denies any nausea and  vomiting. - Workup in the ED showed small bowel obstruction. - CT imaging of the pelvis  Status post subtotal colectomy with marked long segment small bowel dilation demonstrating mild mural thickening, similar to 09/27/2022, with abrupt caliber transition near the ileosigmoid anastomosis, suspicious for small-bowel obstruction. -Of note patient has recent hospital admission in June 2024 with a small bowel obstruction which resolved spontaneously. General surgery discussed with gastroenterology to follow-up outpatient with GI given patient has unclear etiology of SBO and abnormal flex sigmoidoscopy in May 2023 showed moderate stenosis at the side-to-side ileocolonic anastomosis. -Consulted general surgery Dr. Freida Busman for further evaluation recommendation.  Discussed case with her over the phone given patient does not have any nausea/vomiting able to pass stool and flatus no need for NG tube placement at  this time.  Surgery will evaluate soon. -Follows with [facility] outpatient.  Please reach out to [facility] in the morning for evaluation. - Keeping patient n.p.o. - Continue IV hydration LR with IV KCl 10 mEq for 1 day.   Hypokalemia - Low potassium 3.2 on presentation. -Checking mag level. - Replating with IV KCl 10 mEq x 4.  Also continue persistent hydration with LR. - Continue to trend electrolyte and replete as needed.  Chronic constipation - Patient have regular bowel movement however her stools are pebble-like.  She is eating more fiber containing food at home without much improvement. -Last bowel movement 10 PM 11/18/2022. 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  Patient is being managed by outpatient oncology Dr.Rinka Pahwani. Currently going through chemotherapy. -Patient reported she has chemotherapy 6 weeks ago since  then she has noticed poor appetite she lost about about 2 to 3 pounds over the course of last few days.  She also drinking less water and experiencing constipation.  Grade 1 diastolic heart failure preserved EF -Patient developed a grade 1 diastolic heart failure preserved ejection fraction 60 to 65% as comparing from echocardiogram from 04/2021.  Developed diastolic heart failure from chemotherapy for the breast cancer. -Blood pressure within normal range since presentation. - Continue to monitor blood pressure   Sinus tachycardia-improving - Initial presentation patient heart rate was 141 which improved to 109 with IV hydration 1 L bolus in the ED.  Maintaining blood pressure well 127/91. - Continue maintenance fluid LR with IV KCl 10 mEq 125 cc/h for next 24 hours. -Continue cardiac monitoring for development of any arrhythmia. Addendum: -Heart rate again trended up to 140.  Blood pressure well-controlled.  Patient denies any chest pain, chest pressure and palpitation.  Giving patient 1 dose of Lopressor 5 mg IV.  Checking EKG and continue cardiac monitoring. Obtaining echocardiogram in the a.m.    DVT prophylaxis: SQ heparin and SCD  Code Status:  Full Code Diet: Currently n.p.o. Family Communication: Discussed treatment plan with patient Disposition Plan: Improvement of small bowel obstruction.  Tentative discharge to home in next 2 to 3 days if there is no plan for surgery. Consults: General Surgery Admission status:   Inpatient, surgical unit with daily  Severity of Illness: The appropriate patient status for this patient is INPATIENT. Inpatient status is judged to be reasonable and necessary in order to provide the required intensity of service to ensure the patient's safety. The patient's presenting symptoms, physical exam findings, and initial radiographic and laboratory data in the context of their chronic comorbidities is felt to place them at high risk for further clinical  deterioration. Furthermore, it is not anticipated that the patient will be medically stable for discharge from the hospital within 2 midnights of admission.   * I certify that at the point of admission it is my clinical judgment that the patient will require inpatient hospital care spanning beyond 2 midnights from the point of admission due to high intensity of service, high risk for further deterioration and high frequency of surveillance required.Marland Kitchen    Tereasa Coop, MD [facility]  How to contact the [facility] Attending or Consulting provider 7A - 7P or covering provider during after hours 7P -7A, for this patient.  Check the care team in [facility] and look for a) attending/consulting [facility] provider listed and b)  the [facility] team listed Log into [email or web address] and use [place]'s universal password to access. If you do not have the password, please contact the hospital operator. Locate the [facility] provider you are looking for under [facility] and page to a number that you can be directly reached. If you still have difficulty reaching the provider, please page the [facility] (Director on Call) for the Hospitalists listed on amion for assistance.  11/19/2022, 1:13 AM

## 2022-11-19 NOTE — Progress Notes (Signed)
Patient is a GREEN Mews  11/19/22 0025  Vitals  Temp 98.4 F (36.9 C)  Temp Source Oral  BP (!) 127/91  MAP (mmHg) 102  BP Location Left Arm  BP Method Automatic  Patient Position (if appropriate) Lying  Pulse Rate (!) 109  Pulse Rate Source Monitor  Resp 20  MEWS COLOR  MEWS Score Color Green  Oxygen Therapy  SpO2 99 %  O2 Device Room Air  MEWS Score  MEWS Temp 0  MEWS Systolic 0  MEWS Pulse 1  MEWS RR 0  MEWS LOC 0  MEWS Score 1

## 2022-11-19 NOTE — Consult Note (Signed)
Gastroenterology Inpatient Consultation   Attending Requesting Consult Courtney Cory, MD  [facility] Day: 2  Reason for Consult Small bowel obstruction in setting of previously known Crohn's disease status post subtotal colectomy not on medications    History of Present Illness  [name] is a 47 y.o. female with a pmh significant for breast cancer, Crohn's disease (status post subtotal colectomy previously on Entyvio now off), recurrent SBOs.  The GI service is consulted for evaluation and management of small bowel obstruction concerns and unintentional weight loss with previous ileal colonic anastomosis narrowing after subtotal colectomy for Crohn's disease.  Patient has a history of previous Crohn's disease but eventually had multiple bowel obstructions and this led to subtotal colectomy.  She was continued on Entyvio for a period of time but eventually due to concerns of underlying Breast Cancer, this was stopped after her chemotherapy was initiated.  She was last seen in 5/24 in the outpatient [facility] and overall had ben stable off of therapy but it was discussed that she would be higher risk to have complications occur off of therapy.  She presented in 6/24 for issues of concern for SBO and was managed without need for any surgical intervention.  Unfortunately, she returns again last night with progressive issues of abdominal pain, weight loss, and poor appetite.  Imaging was performed and suggested a bowel obstruction with inflammation noted in distal small bowel and concern for narrowing at the anastomosis.  Last sigmoidoscopy suggested narrowing at the anastomosis but this was able to be traversed with anastomosis showing nonspeciifc inflammation while the rectum showed changes.  Patient will not be going back on chemotherapy at this time.  She is interested in biologic therapy if needed.  She wants to just feel better.  She has been having progressive 15 pound  weight loss over the last month as a result of anorexia and decreased appetite with nausea.  She has been passing bowel movements since yesterday and again today though it is loose and more watery.  No blood has been noted.  GI Review of Systems Positive as above including bloating Negative for dysphagia, odynophagia, urgency, melena, hematochezia   Review of Systems  General: Denies fevers/chills HEENT: Denies oral lesions Cardiovascular: Denies chest pain Pulmonary: Denies shortness of breath Gastroenterological: See HPI Genitourinary: Denies darkened urine Hematological: Denies easy bruising/bleeding Dermatological: Denies jaundice Psychological: Mood is anxious and scared that she will get better   Histories  Past Medical History Past Medical History:  Diagnosis Date   Cancer (HCC)    Colon stricture (HCC) 05/07/2019   Crohn's colitis, with intestinal obstruction (HCC) 09/17/2013   Dx 2006 - right and left colon involved 2011 - pan-colitis 09/17/2013 left colitis 60-20 cm March 2021 subtotal colectomy ileosigmoid anastomosis for colonic strictures-Dr. Maisie Fus     Crohn's disease [facility])    Family history of breast cancer 06/11/2020   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 10/17/2010   Rectal bleeding    Uterine fibroid    Vitamin D deficiency 09/24/2013   Past Surgical History:  Procedure Laterality Date   AXILLARY LYMPH NODE DISSECTION Right 11/09/2020   Procedure: RIGHT AXILLARY LYMPH NODE DISSECTION;  Surgeon: Emelia Loron, MD;  Location: [facility];  Service: General;  Laterality: Right;   BIOPSY  07/01/2020   Procedure: BIOPSY;  Surgeon: Benancio Deeds, MD;  Location: [facility];  Service: Gastroenterology;;   BIOPSY  01/27/2021   Procedure:  BIOPSY;  Surgeon: Benancio Deeds, MD;  Location: [facility];  Service: Gastroenterology;;   BIOPSY  09/07/2021   Procedure: BIOPSY;  Surgeon: Lynann Bologna, MD;   Location: [facility];  Service: Gastroenterology;;   BREAST BIOPSY Right 03/24/2022   [place] RT BREAST BX W LOC DEV 1ST LESION IMG BX SPEC US GUIDE 03/24/2022 [facility]   BREAST BIOPSY  04/28/2022   [place] RT RADIOACTIVE SEED LOC 04/28/2022 [facility]   BREAST IMPLANT REMOVAL Right 08/07/2022   Procedure: REMOVAL  OF RIGHT BREAST IMPLANTS;  Surgeon: Allena Napoleon, MD;  Location: [facility];  Service: Plastics;  Laterality: Right;   BREAST RECONSTRUCTION WITH PLACEMENT OF TISSUE EXPANDER AND FLEX HD (ACELLULAR HYDRATED DERMIS) Bilateral 11/09/2020   Procedure: BILATERAL BREAST RECONSTRUCTION WITH PLACEMENT OF TISSUE EXPANDER AND FLEX HD (ACELLULAR HYDRATED DERMIS);  Surgeon: Allena Napoleon, MD;  Location: [facility];  Service: Plastics;  Laterality: Bilateral;   CAPSULOTOMY Bilateral 07/25/2021   Procedure: CAPSULOTOMY;  Surgeon: Allena Napoleon, MD;  Location: [facility];  Service: Plastics;  Laterality: Bilateral;   COLECTOMY  06/27/2019   COLONOSCOPY  2015   FLEXIBLE SIGMOIDOSCOPY N/A 07/01/2020   Procedure: FLEXIBLE SIGMOIDOSCOPY;  Surgeon: Benancio Deeds, MD;  Location: [facility];  Service: Gastroenterology;  Laterality: N/A;   FLEXIBLE SIGMOIDOSCOPY N/A 01/27/2021   Procedure: FLEXIBLE SIGMOIDOSCOPY;  Surgeon: Benancio Deeds, MD;  Location: [facility];  Service: Gastroenterology;  Laterality: N/A;   FLEXIBLE SIGMOIDOSCOPY N/A 09/07/2021   Procedure: FLEXIBLE SIGMOIDOSCOPY;  Surgeon: Lynann Bologna, MD;  Location: [facility];  Service: Gastroenterology;  Laterality: N/A;   NIPPLE SPARING MASTECTOMY Bilateral 11/09/2020   Procedure: BILATERAL NIPPLE SPARING MASTECTOMY;  Surgeon: Emelia Loron, MD;  Location: [facility];  Service: General;  Laterality: Bilateral;   PORT-A-CATH REMOVAL N/A 07/25/2021   Procedure: REMOVAL PORT-A-CATH;  Surgeon: Allena Napoleon, MD;  Location: [facility];   Service: Plastics;  Laterality: N/A;   PORTACATH PLACEMENT N/A 06/23/2020   Procedure: INSERTION PORT-A-CATH;  Surgeon: Emelia Loron, MD;  Location: [facility];  Service: General;  Laterality: N/A;  START TIME OF 11:00 AM FOR 60 MINUTES [place] [facility]   PORTACATH PLACEMENT Left 05/02/2022   Procedure: INSERTION PORT-A-CATH;  Surgeon: Emelia Loron, MD;  Location: [facility];  Service: General;  Laterality: Left;   RADIOACTIVE SEED GUIDED EXCISIONAL BREAST BIOPSY Right 05/02/2022   Procedure: RADIOACTIVE SEED GUIDED RIGHT BREAST MASS EXCISION;  Surgeon: Emelia Loron, MD;  Location: [facility];  Service: General;  Laterality: Right;   REMOVAL OF BILATERAL TISSUE EXPANDERS WITH PLACEMENT OF BILATERAL BREAST IMPLANTS Bilateral 07/25/2021   Procedure: REMOVAL OF BILATERAL TISSUE EXPANDERS WITH PLACEMENT OF BILATERAL BREAST IMPLANTS;  Surgeon: Allena Napoleon, MD;  Location: [facility];  Service: Plastics;  Laterality: Bilateral;  1.5    Allergies Allergies  Allergen Reactions   Nsaids Other (See Comments)    Crohns disease/ IBD   Other     BLOOD PRODUCT REFUSAL    Family History Family History  Problem Relation Age of Onset   Diabetes Father    Breast cancer Maternal Aunt        dx unknown age   Diabetes Paternal Aunt    Breast cancer Cousin        maternal cousin; dx unknown age   Esophageal cancer Neg Hx    Rectal cancer Neg Hx    Stomach cancer Neg  Hx    Colon polyps Neg Hx    Colon cancer Neg Hx    The patient's FH is negative for IBD/IBS/Liver Disease/GI Malignancies.  Social History Social History   Socioeconomic History   Marital status: Single    Spouse name: Not on file   Number of children: 1   Years of education: 13   Highest education level: Not on file  Occupational History   Occupation: Medical Coder   Tobacco Use   Smoking status: Former    Types: Cigarettes   Smokeless tobacco: Never   Vaping Use   Vaping status: Never Used  Substance and Sexual Activity   Alcohol use: Yes    Alcohol/week: 1.0 - 2.0 standard drink of alcohol    Types: 1 - 2 Glasses of wine per week   Drug use: No   Sexual activity: Yes    Partners: Male    Comment: Intermittent protection not consistent  Other Topics Concern   Not on file  Social History Narrative   Single, 1 daughter born 2001 approximately   Medical coder for [employer]   She is a former smoker, occasional alcohol no drug use   Social Determinants of Corporate investment banker Strain: Not on file  Food Insecurity: No Food Insecurity (11/18/2022)   Hunger Vital Sign    Worried About Running Out of Food in the Last Year: Never true    Ran Out of Food in the Last Year: Never true  Transportation Needs: No Transportation Needs (11/18/2022)   PRAPARE - Administrator, Civil Service (Medical): No    Lack of Transportation (Non-Medical): No  Physical Activity: Not on file  Stress: Not on file  Social Connections: Not on file  Intimate Partner Violence: Not At Risk (11/18/2022)   Humiliation, Afraid, Rape, and Kick questionnaire    Fear of Current or Ex-Partner: No    Emotionally Abused: No    Physically Abused: No    Sexually Abused: No    Medications  Home Medications No current facility-administered medications on file prior to encounter.   Current Outpatient Medications on File Prior to Encounter  Medication Sig Dispense Refill   acetaminophen (TYLENOL) 500 MG tablet Take 500-1,000 mg by mouth every 6 (six) hours as needed for mild pain.     Multiple Vitamins-Minerals (MULTIVITAMIN WITH MINERALS) tablet Take 1 tablet by mouth daily.     potassium chloride SA (KLOR-CON M) 20 MEQ tablet Take 1 tablet (20 mEq total) by mouth daily. 90 tablet 2   Scheduled Inpatient Medications  Chlorhexidine Gluconate Cloth  6 each Topical Daily   heparin  5,000 Units Subcutaneous Q8H   ondansetron (ZOFRAN) IV  4 mg  Intravenous Once   senna-docusate  1 tablet Oral BID   sodium chloride flush  3 mL Intravenous Q12H   Continuous Inpatient Infusions  sodium chloride     lactated ringers 1,000 mL with potassium chloride 10 mEq infusion 100 mL/hr at 11/19/22 0351   lactated ringers Stopped (11/19/22 0251)   PRN Inpatient Medications sodium chloride, acetaminophen **OR** acetaminophen, ondansetron **OR** ondansetron (ZOFRAN) IV, sodium chloride flush   Physical Examination  BP 112/82 (BP Location: Left Arm)   Pulse (!) 107   Temp 98.9 F (37.2 C) (Oral)   Resp 18   Ht 5\' 4"  (1.626 m)   Wt 49.9 kg   LMP 04/24/2021   SpO2 99%   BMI 18.88 kg/m  GEN: NAD, appears stated age, doesn't appear chronically  ill PSYCH: Cooperative, without pressured speech EYE: Conjunctivae pink, sclerae anicteric ENT: MMM CV: Tachycardic into the 130s RESP: No audible wheezing GI: NABS, soft, surgical scars present, tympanic to percussion, minimal tenderness to deep palpation, no rebound  MSK/EXT: Trace bilateral pedal edema SKIN: No jaundice NEURO:  Alert & Oriented x 3, no focal deficits   Review of Data  I reviewed the following data at the time of this encounter:  Laboratory Studies   Recent Labs  Lab 11/18/22 1707 11/19/22 0044  NA 137  --   K 3.2*  --   CL 101  --   CO2 25  --   BUN 10  --   CREATININE 0.61  --   GLUCOSE 88  --   CALCIUM 8.4*  --   MG  --  1.8   Recent Labs  Lab 11/18/22 1707  AST 9*  ALT 6  ALKPHOS 106    Recent Labs  Lab 11/18/22 1707  WBC 8.8  HGB 10.1*  HCT 30.6*  PLT 391   No results for input(s): "APTT", "INR" in the last 168 hours.  Imaging Studies  CT chest abdomen pelvis (yesterday) IMPRESSION: 1. No evidence of pulmonary embolism. 2. Status post subtotal colectomy with marked long segment small bowel dilation demonstrating mild mural thickening, similar to 09/27/2022, with abrupt caliber transition near the ileosigmoid anastomosis, suspicious for  small-bowel obstruction. 3. Unchanged subpleural ground-glass densities along the anterior right upper and middle lobe, likely postradiation change. 4. 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 5. Unchanged 3 mm basilar left lower lobe nodule.  CT abdomen pelvis September 27, 2022 IMPRESSION: Status post subtotal colectomy. Severe diffuse small bowel dilatation is noted with severe narrowing or transition zone seen in the pelvis consistent with small bowel obstruction. It is uncertain if this is inflammatory or neoplastic in etiology. Enlarged fibroid uterus  GI Procedures and Studies  5/23 flex sig - Anastomotic stenosis with ulcers and edema s/o recurrence of Crohn's disease. Biopsied. - Crohn's proctitis- biopsied.   Assessment  [name] is a 47 y.o. female with a pmh significant for breast cancer, Crohn's disease (status post subtotal colectomy previously on Entyvio now off), recurrent SBOs.  The GI service is consulted for evaluation and management of small bowel obstruction concerns and unintentional weight loss with previous ileal colonic anastomosis narrowing after subtotal colectomy for Crohn's disease.  The patient is hemodynamically stable.  Clinically, she seems to be doing better than when she came in to the hospital.  The biggest question for this patient is does she have active persistent Crohn's disease in the setting of being off all biologic therapy or does she have a fibrostenotic disease as a result of the previous surgical anastomosis.  I suspect she probably has both of these occurring.  I less concerned about upper GI Crohn's but I think an upper endoscopic evaluation does make sense.  Lower endoscopic reevaluation makes sense.  This will also help guide the potential reinitiation of biologic Entyvio.  Because of the potential role of enteral stenting with AXIOS lumen  apposing metal stents, I would like to offer the patient an EGD/flexible sigmoidoscopy.  This could be performed by my colleagues coming on this week, but they do not perform AXIOS stenting.  I will try my best to add her on Tuesday for procedures pending my availability which looks to be present currently.  The risks and benefits of endoscopic  evaluation were discussed with the patient; these include but are not limited to the risk of perforation, infection, bleeding, missed lesions, lack of diagnosis, severe illness requiring hospitalization, as well as anesthesia and sedation related illnesses.  The patient and/or family is agreeable to proceed.  I will advance her diet from clears to full's.  She will need a bowel preparation on Monday but I will allow the inpatient team to write for that tomorrow.  I have discussed her case with her primary gastroenterologist, Dr. Leone Payor as well who agrees with this plan of action.   Plan/Recommendations  Will allow advancement of diet from clear liquid to full liquid today To be reevaluated tomorrow to see how things are going but as we will plan an EGD/flexible sigmoidoscopy for Tuesday, pending anesthesia availability and my availability, I will not want to advance her more than that Partial bowel preparation will need needed on Monday into Tuesday, will not order that currently allow team tomorrow to decide Hold heparin VTE prophylaxis on 8/12 PM and beyond until procedures are completed   Thank you for this consult.  We will continue to follow.  Please page/call with questions or concerns.   Corliss Parish, MD [facility] Office # [phone]

## 2022-11-20 DIAGNOSIS — E871 Hypo-osmolality and hyponatremia: Secondary | ICD-10-CM | POA: Diagnosis not present

## 2022-11-20 DIAGNOSIS — R627 Adult failure to thrive: Secondary | ICD-10-CM | POA: Diagnosis not present

## 2022-11-20 DIAGNOSIS — D649 Anemia, unspecified: Secondary | ICD-10-CM | POA: Diagnosis not present

## 2022-11-20 DIAGNOSIS — K50912 Crohn's disease, unspecified, with intestinal obstruction: Secondary | ICD-10-CM

## 2022-11-20 LAB — IRON AND TIBC
Iron: 22 ug/dL — ABNORMAL LOW (ref 28–170)
Saturation Ratios: 19 % (ref 10.4–31.8)
TIBC: 118 ug/dL — ABNORMAL LOW (ref 250–450)
UIBC: 96 ug/dL

## 2022-11-20 LAB — CBC
HCT: 28.1 % — ABNORMAL LOW (ref 36.0–46.0)
Hemoglobin: 8.9 g/dL — ABNORMAL LOW (ref 12.0–15.0)
MCH: 25.1 pg — ABNORMAL LOW (ref 26.0–34.0)
MCHC: 31.7 g/dL (ref 30.0–36.0)
MCV: 79.2 fL — ABNORMAL LOW (ref 80.0–100.0)
Platelets: 361 10*3/uL (ref 150–400)
RBC: 3.55 MIL/uL — ABNORMAL LOW (ref 3.87–5.11)
RDW: 16.1 % — ABNORMAL HIGH (ref 11.5–15.5)
WBC: 7.2 10*3/uL (ref 4.0–10.5)
nRBC: 0 % (ref 0.0–0.2)

## 2022-11-20 LAB — BASIC METABOLIC PANEL WITH GFR
Anion gap: 9 (ref 5–15)
BUN: 7 mg/dL (ref 6–20)
CO2: 24 mmol/L (ref 22–32)
Calcium: 7.6 mg/dL — ABNORMAL LOW (ref 8.9–10.3)
Chloride: 98 mmol/L (ref 98–111)
Creatinine, Ser: 0.66 mg/dL (ref 0.44–1.00)
GFR, Estimated: >60 mL/min
Glucose, Bld: 82 mg/dL (ref 70–99)
Potassium: 3.6 mmol/L (ref 3.5–5.1)
Sodium: 131 mmol/L — ABNORMAL LOW (ref 135–145)

## 2022-11-20 LAB — PROTIME-INR
INR: 1.1 (ref 0.8–1.2)
Prothrombin Time: 14.9 s (ref 11.4–15.2)

## 2022-11-20 LAB — RETICULOCYTES
Immature Retic Fract: 21.2 % — ABNORMAL HIGH (ref 2.3–15.9)
RBC.: 3.53 MIL/uL — ABNORMAL LOW (ref 3.87–5.11)
Retic Count, Absolute: 30 10*3/uL (ref 19.0–186.0)
Retic Ct Pct: 0.9 % (ref 0.4–3.1)

## 2022-11-20 LAB — FOLATE: Folate: 16.2 ng/mL

## 2022-11-20 MED ORDER — POTASSIUM CHLORIDE 2 MEQ/ML IV SOLN
INTRAVENOUS | Status: DC
  Filled 2022-11-20 (×6): qty 1000

## 2022-11-20 MED ORDER — PANTOPRAZOLE SODIUM 40 MG IV SOLR
40.0000 mg | Freq: Every day | INTRAVENOUS | Status: DC
  Filled 2022-11-20 (×4): qty 10

## 2022-11-20 NOTE — Progress Notes (Signed)
   11/20/22 0856  TOC Brief Assessment  Insurance and Status Reviewed  Patient has primary care physician Yes  Home environment has been reviewed Resides with significant other  Prior level of function: Independent at baseline  Prior/Current Home Services No current home services  Social Determinants of Health Reivew SDOH reviewed no interventions necessary  Readmission risk has been reviewed Yes  Transition of care needs no transition of care needs at this time

## 2022-11-20 NOTE — Plan of Care (Signed)
Problem: Education: Goal: Knowledge of General Education information will improve Description Including pain rating scale, medication(s)/side effects and non-pharmacologic comfort measures Outcome: Progressing   Problem: Health Behavior/Discharge Planning: Goal: Ability to manage health-related needs will improve Outcome: Progressing   Problem: Activity: Goal: Risk for activity intolerance will decrease Outcome: Progressing   Problem: Nutrition: Goal: Adequate nutrition will be maintained Outcome: Progressing   Problem: Coping: Goal: Level of anxiety will decrease Outcome: Progressing   Problem: Elimination: Goal: Will not experience complications related to bowel motility Outcome: Progressing Goal: Will not experience complications related to urinary retention Outcome: Progressing   Problem: Pain Managment: Goal: General experience of comfort will improve Outcome: Progressing   Problem: Safety: Goal: Ability to remain free from injury will improve Outcome: Progressing   Problem: Skin Integrity: Goal: Risk for impaired skin integrity will decrease Outcome: Progressing   

## 2022-11-20 NOTE — H&P (View-Only) (Signed)
[facility] Progress Note  CC: Crohn's disease, small bowel obstruction   Subjective: She denies having any nausea or vomiting.  She has intermittent mild upper abdominal discomfort, no severe abdominal pain.  She passed a dark brown loose stool with bits of solid stool around 8 AM.  No melena or bright red blood per the rectum.   Objective:  Vital signs in last 24 hours: Temp:  [98.1 F (36.7 C)-99.1 F (37.3 C)] 99 F (37.2 C) (08/12 0612) Pulse Rate:  [116-124] 124 (08/12 0612) Resp:  [16-20] 18 (08/12 0612) BP: (119-127)/(83-96) 121/83 (08/12 0612) SpO2:  [98 %-100 %] 98 % (08/12 0612) Weight:  [48.7 kg] 48.7 kg (08/12 0500) Last BM Date : 11/19/22 General: Alert 47 year old female in no acute distress. Heart: Tachycardic, no murmurs. Pulm: Breath sounds clear throughout. Chest: Port-A-Cath intact. Abdomen: Soft, mildly protuberant.  Nontender. Gurgling bowel sounds to all 4 quadrants.  No bruit.  No palpable mass. Extremities: No lower extremity edema.  Neurologic:  Alert and  oriented x 4. Grossly normal neurologically. Psych:  Alert and cooperative. Normal mood and affect.  Intake/Output from previous day: 08/11 0701 - 08/12 0700 In: 3426.5 [P.O.:240; I.V.:2186.5; IV Piggyback:1000] Out: 2150 [Urine:2150] Intake/Output this shift: No intake/output data recorded.  Lab Results: Recent Labs    11/18/22 1707 11/19/22 0743 11/20/22 0252  WBC 8.8 7.7 7.2  HGB 10.1* 9.2* 8.9*  HCT 30.6* 28.9* 28.1*  PLT 391 364 361   BMET Recent Labs    11/18/22 1707 11/19/22 0743 11/20/22 0252  NA 137 132* 131*  K 3.2* 3.7 3.6  CL 101 101 98  CO2 25 20* 24  GLUCOSE 88 78 82  BUN 10 7 7   CREATININE 0.61 0.66 0.66  CALCIUM 8.4* 8.0* 7.6*   LFT Recent Labs    11/18/22 1707  PROT 5.9*  ALBUMIN 2.9*  AST 9*  ALT 6  ALKPHOS 106  BILITOT 0.6   PT/INR Recent Labs    11/20/22 0252  LABPROT 14.9  INR 1.1   Hepatitis Panel No results for  input(s): "HEPBSAG", "HCVAB", "HEPAIGM", "HEPBIGM" in the last 72 hours.  ECHOCARDIOGRAM LIMITED  Result Date: 11/19/2022    ECHOCARDIOGRAM LIMITED REPORT   Patient Name:   [name] Date of Exam: 11/19/2022 Medical Rec #:  [id number]        Height:       64.0 in Accession #:    [id number]       Weight:       110.0 lb Date of Birth:  12-28-1975        BSA:          1.517 m Patient Age:    46 years         BP:           112/82 mmHg Patient Gender: F                HR:           108 bpm. Exam Location:  Inpatient Procedure: Limited Echo, Color Doppler and Cardiac Doppler Indications:    Ventricular Tachycardia  History:        Patient has prior history of Echocardiogram examinations, most                 recent 09/18/2022. Arrythmias:Tachycardia.  Sonographer:    Milbert Coulter Referring Phys: [id number] SUBRINA SUNDIL  Sonographer Comments: Suboptimal apical window. Image acquisition challenging due to breast implants. IMPRESSIONS  1. Left ventricular ejection fraction, by estimation, is 60 to 65%. The left ventricle has normal function. The left ventricle has no regional wall motion abnormalities. Left ventricular diastolic parameters are indeterminate.  2. Right ventricular systolic function is normal. The right ventricular size is normal.  3. The mitral valve is normal in structure. No evidence of mitral valve regurgitation. No evidence of mitral stenosis.  4. The aortic valve is normal in structure. Aortic valve regurgitation is not visualized. No aortic stenosis is present.  5. The inferior vena cava is normal in size with greater than 50% respiratory variability, suggesting right atrial pressure of 3 mmHg. FINDINGS  Left Ventricle: Left ventricular ejection fraction, by estimation, is 60 to 65%. The left ventricle has normal function. The left ventricle has no regional wall motion abnormalities. The left ventricular internal cavity size was normal in size. There is  no left ventricular hypertrophy. Left  ventricular diastolic parameters are indeterminate. Right Ventricle: The right ventricular size is normal. No increase in right ventricular wall thickness. Right ventricular systolic function is normal. Left Atrium: Left atrial size was normal in size. Right Atrium: Right atrial size was normal in size. Pericardium: Trivial pericardial effusion is present. The pericardial effusion is circumferential. Mitral Valve: The mitral valve is normal in structure. No evidence of mitral valve stenosis. Tricuspid Valve: The tricuspid valve is normal in structure. Tricuspid valve regurgitation is not demonstrated. No evidence of tricuspid stenosis. Aortic Valve: The aortic valve is normal in structure. Aortic valve regurgitation is not visualized. No aortic stenosis is present. Pulmonic Valve: The pulmonic valve was normal in structure. Pulmonic valve regurgitation is not visualized. No evidence of pulmonic stenosis. Aorta: The aortic root is normal in size and structure. Venous: The inferior vena cava is normal in size with greater than 50% respiratory variability, suggesting right atrial pressure of 3 mmHg. IAS/Shunts: No atrial level shunt detected by color flow Doppler. Additional Comments: Spectral Doppler performed. Color Doppler performed.  LEFT VENTRICLE PLAX 2D LVIDd:         3.50 cm LVIDs:         2.20 cm LV PW:         0.90 cm LV IVS:        0.80 cm LVOT diam:     1.80 cm LVOT Area:     2.54 cm  LEFT ATRIUM         Index LA diam:    2.70 cm 1.78 cm/m   AORTA Ao Root diam: 2.80 cm  SHUNTS Systemic Diam: 1.80 cm Kardie Tobb DO Electronically signed by Thomasene Ripple DO Signature Date/Time: 11/19/2022/12:23:02 PM    Final    CT ABDOMEN PELVIS W CONTRAST  Result Date: 11/18/2022 CLINICAL DATA:  History of breast cancer on chemotherapy with loss of appetite. * Tracking Code: BO * EXAM: CT ANGIOGRAPHY CHEST CT ABDOMEN AND PELVIS WITH CONTRAST TECHNIQUE: Multidetector CT imaging of the chest was performed using the  standard protocol during bolus administration of intravenous contrast. Multiplanar CT image reconstructions and MIPs were obtained to evaluate the vascular anatomy. Multidetector CT imaging of the abdomen and pelvis was performed using the standard protocol during bolus administration of intravenous contrast. RADIATION DOSE REDUCTION: This exam was performed according to the departmental dose-optimization program which includes automated exposure control, adjustment of the mA and/or kV according to patient size and/or use of iterative reconstruction technique. CONTRAST:  OMNIPAQUE IOHEXOL 300 MG/ML  SOLN COMPARISON:  CT abdomen and pelvis dated 09/27/2022, CT  chest dated 04/06/2022 FINDINGS: CTA CHEST FINDINGS Cardiovascular: Left chest wall port terminates in the right atrium. The study is high quality for the evaluation of pulmonary embolism. There are no filling defects in the central, lobar, segmental or subsegmental pulmonary artery branches to suggest acute pulmonary embolism. Anatomic variant common origin of the brachiocephalic and common carotid arteries. Normal heart size. No significant pericardial fluid/thickening. Mediastinum/Nodes: Imaged thyroid gland without nodules meeting criteria for imaging follow-up by size. Normal esophagus. No pathologically enlarged axillary, supraclavicular, mediastinal, or hilar lymph nodes. Lungs/Pleura: The central airways are patent. Unchanged subpleural ground-glass densities along the anterior right upper and middle lobe. Unchanged small area of perifissural ground-glass opacity is also seen in the anterior right lower lobe. Unchanged 3 mm basilar left lower lobe nodule (6:88). No pneumothorax. No pleural effusion. Musculoskeletal: No acute or abnormal lytic or blastic osseous lesions. Right mastectomy. Left breast implant. Review of the MIP images confirms the above findings. CT ABDOMEN and PELVIS FINDINGS Hepatobiliary: Subcentimeter hypoattenuating focus in  segment 5 (2:23) too small to characterize but unchanged. No intra or extrahepatic biliary ductal dilation. Normal gallbladder. Pancreas: No focal lesions or main ductal dilation. Spleen: Normal in size without focal abnormality. Adrenals/Urinary Tract: No adrenal nodules. No suspicious renal mass, calculi, or hydronephrosis. No focal bladder wall thickening. Stomach/Bowel: Normal appearance of the stomach. Status post subtotal colectomy with ileosigmoid anastomosis. Marked long segment small bowel dilation demonstrating mild mural thickening, similar to 09/27/2022, with abrupt caliber transition near the ileosigmoid anastomosis (5:51). Vascular/Lymphatic: No significant vascular findings are present. No enlarged abdominal or pelvic lymph nodes. Reproductive: Enlarged and lobulated uterus containing multifocal enhancing masses, including a few with coarse calcifications. No adnexal masses. Other: No free fluid, fluid collection, or free air. Musculoskeletal: No acute or abnormal lytic or blastic osseous lesions. IMPRESSION: 1. No evidence of pulmonary embolism. 2. Status post subtotal colectomy with marked long segment small bowel dilation demonstrating mild mural thickening, similar to 09/27/2022, with abrupt caliber transition near the ileosigmoid anastomosis, suspicious for small-bowel obstruction. 3. Unchanged subpleural ground-glass densities along the anterior right upper and middle lobe, likely postradiation change. 4. 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 5. Unchanged 3 mm basilar left lower lobe nodule. Electronically Signed   By: Agustin Cree M.D.   On: 11/18/2022 18:33   CT Angio Chest PE W/Cm &/Or Wo Cm  Result Date: 11/18/2022 CLINICAL DATA:  History of breast cancer on chemotherapy with loss of appetite. * Tracking Code: BO * EXAM: CT ANGIOGRAPHY CHEST CT ABDOMEN AND PELVIS WITH CONTRAST  TECHNIQUE: Multidetector CT imaging of the chest was performed using the standard protocol during bolus administration of intravenous contrast. Multiplanar CT image reconstructions and MIPs were obtained to evaluate the vascular anatomy. Multidetector CT imaging of the abdomen and pelvis was performed using the standard protocol during bolus administration of intravenous contrast. RADIATION DOSE REDUCTION: This exam was performed according to the departmental dose-optimization program which includes automated exposure control, adjustment of the mA and/or kV according to patient size and/or use of iterative reconstruction technique. CONTRAST:  OMNIPAQUE IOHEXOL 300 MG/ML  SOLN COMPARISON:  CT abdomen and pelvis dated 09/27/2022, CT chest dated 04/06/2022 FINDINGS: CTA CHEST FINDINGS Cardiovascular: Left chest wall port terminates in the right atrium. The study is high quality for the evaluation of pulmonary embolism. There are no filling defects in the central, lobar, segmental or subsegmental pulmonary artery branches to suggest acute pulmonary embolism.  Anatomic variant common origin of the brachiocephalic and common carotid arteries. Normal heart size. No significant pericardial fluid/thickening. Mediastinum/Nodes: Imaged thyroid gland without nodules meeting criteria for imaging follow-up by size. Normal esophagus. No pathologically enlarged axillary, supraclavicular, mediastinal, or hilar lymph nodes. Lungs/Pleura: The central airways are patent. Unchanged subpleural ground-glass densities along the anterior right upper and middle lobe. Unchanged small area of perifissural ground-glass opacity is also seen in the anterior right lower lobe. Unchanged 3 mm basilar left lower lobe nodule (6:88). No pneumothorax. No pleural effusion. Musculoskeletal: No acute or abnormal lytic or blastic osseous lesions. Right mastectomy. Left breast implant. Review of the MIP images confirms the above findings. CT ABDOMEN and  PELVIS FINDINGS Hepatobiliary: Subcentimeter hypoattenuating focus in segment 5 (2:23) too small to characterize but unchanged. No intra or extrahepatic biliary ductal dilation. Normal gallbladder. Pancreas: No focal lesions or main ductal dilation. Spleen: Normal in size without focal abnormality. Adrenals/Urinary Tract: No adrenal nodules. No suspicious renal mass, calculi, or hydronephrosis. No focal bladder wall thickening. Stomach/Bowel: Normal appearance of the stomach. Status post subtotal colectomy with ileosigmoid anastomosis. Marked long segment small bowel dilation demonstrating mild mural thickening, similar to 09/27/2022, with abrupt caliber transition near the ileosigmoid anastomosis (5:51). Vascular/Lymphatic: No significant vascular findings are present. No enlarged abdominal or pelvic lymph nodes. Reproductive: Enlarged and lobulated uterus containing multifocal enhancing masses, including a few with coarse calcifications. No adnexal masses. Other: No free fluid, fluid collection, or free air. Musculoskeletal: No acute or abnormal lytic or blastic osseous lesions. IMPRESSION: 1. No evidence of pulmonary embolism. 2. Status post subtotal colectomy with marked long segment small bowel dilation demonstrating mild mural thickening, similar to 09/27/2022, with abrupt caliber transition near the ileosigmoid anastomosis, suspicious for small-bowel obstruction. 3. Unchanged subpleural ground-glass densities along the anterior right upper and middle lobe, likely postradiation change. 4. 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 5. Unchanged 3 mm basilar left lower lobe nodule. Electronically Signed   By: Agustin Cree M.D.   On: 11/18/2022 18:33    Assessment / Plan:  47 y.o. female with a history of Crohn's disease status post subtotal colectomy with ileal colonic anastomosis narrowing, previously on  Entyvio, who was admitted to the hospital  11/19/2022 with recurrent SBO. CTAP with contrast 11/18/2022 showed evidence of past subtotal colectomy with marked long segment small bowel dilation demonstrating mild mural thickening with abrupt caliber transition near the ileosigmoid anastomosis, suspicious for small-bowel obstruction. -Flexible sigmoidoscopy to rule out active persistent Crohn's disease in the setting of being off all biologic therapy verses fibrostenotic disease. Flexible sigmoidoscopy tentative scheduled with our advanced endoscopist Dr. Meridee Score on Tuesday 11/21/2022 as she may require enteral Axious stenting -EGD to rule out UGI Crohn's at time of flexible sigmoidoscopy  -Full liquid diet for breakfast and lunch, then clear liquids this evening -NPO after midnight  -IV fluids and pain management per the hospitalist  -CBC, BMP and CRP in am  Acute on chronic anemia. Admission Hg 10.1 -> 9.2 -> today Hg 8.9. MCV 79.2. Iron 22. TIBC 118. Ferritin 160. Vitamin B12 1,244. No overt GI bleeding.  -See plan above   Hyponatremia. Na+ 131.  History of breasts cancer 05/2020 s/p bilateral mastectomies, chemotherapy and radiation    Principal Problem:   Failure to thrive in adult Active Problems:   Generalized abdominal pain   History of Crohn's disease   Crohn's disease of small and large intestines with complication (  HCC)   History of abdominal subtotal colectomy   Hypokalemia   Chronic disease anemia   Protein-calorie malnutrition, moderate (HCC)   Anorexia   Nausea   Abnormal CT scan, gastrointestinal tract   Colonic stricture (HCC)     LOS: 2 days   Arnaldo Natal  11/20/2022, 10:38AM   Attending physician's note   I have taken history, reviewed the chart and examined the patient. I performed a substantive portion of this encounter, including complete performance of at least one of the key components, in conjunction with the APP. I agree with the Advanced  Practitioner's note, impression and recommendations.   Received signout from Dr. Meridee Score.   No further N/V. Abdo pain is better  For FS with +/- Axios stent and EGD tomorrow with Dr Judie Petit  If significant Crohn's, resume Entyvio (after loading dose IV 300mg  week 0, 2, she would prefer SQ PEN 108mg  Q 2 weekly.  Does not want p.o. MiraLAX for prep.  Hence, only enemas.     Edman Circle, MD [facility] [phone]

## 2022-11-20 NOTE — Progress Notes (Addendum)
[facility] Progress Note  CC: Crohn's disease, small bowel obstruction   Subjective: She denies having any nausea or vomiting.  She has intermittent mild upper abdominal discomfort, no severe abdominal pain.  She passed a dark brown loose stool with bits of solid stool around 8 AM.  No melena or bright red blood per the rectum.   Objective:  Vital signs in last 24 hours: Temp:  [98.1 F (36.7 C)-99.1 F (37.3 C)] 99 F (37.2 C) (08/12 0612) Pulse Rate:  [116-124] 124 (08/12 0612) Resp:  [16-20] 18 (08/12 0612) BP: (119-127)/(83-96) 121/83 (08/12 0612) SpO2:  [98 %-100 %] 98 % (08/12 0612) Weight:  [48.7 kg] 48.7 kg (08/12 0500) Last BM Date : 11/19/22 General: Alert 47 year old female in no acute distress. Heart: Tachycardic, no murmurs. Pulm: Breath sounds clear throughout. Chest: Port-A-Cath intact. Abdomen: Soft, mildly protuberant.  Nontender. Gurgling bowel sounds to all 4 quadrants.  No bruit.  No palpable mass. Extremities: No lower extremity edema.  Neurologic:  Alert and  oriented x 4. Grossly normal neurologically. Psych:  Alert and cooperative. Normal mood and affect.  Intake/Output from previous day: 08/11 0701 - 08/12 0700 In: 3426.5 [P.O.:240; I.V.:2186.5; IV Piggyback:1000] Out: 2150 [Urine:2150] Intake/Output this shift: No intake/output data recorded.  Lab Results: Recent Labs    11/18/22 1707 11/19/22 0743 11/20/22 0252  WBC 8.8 7.7 7.2  HGB 10.1* 9.2* 8.9*  HCT 30.6* 28.9* 28.1*  PLT 391 364 361   BMET Recent Labs    11/18/22 1707 11/19/22 0743 11/20/22 0252  NA 137 132* 131*  K 3.2* 3.7 3.6  CL 101 101 98  CO2 25 20* 24  GLUCOSE 88 78 82  BUN 10 7 7   CREATININE 0.61 0.66 0.66  CALCIUM 8.4* 8.0* 7.6*   LFT Recent Labs    11/18/22 1707  PROT 5.9*  ALBUMIN 2.9*  AST 9*  ALT 6  ALKPHOS 106  BILITOT 0.6   PT/INR Recent Labs    11/20/22 0252  LABPROT 14.9  INR 1.1   Hepatitis Panel No results for  input(s): "HEPBSAG", "HCVAB", "HEPAIGM", "HEPBIGM" in the last 72 hours.  ECHOCARDIOGRAM LIMITED  Result Date: 11/19/2022    ECHOCARDIOGRAM LIMITED REPORT   Patient Name:   SANDI MIELES Date of Exam: 11/19/2022 Medical Rec #:  [id number]        Height:       64.0 in Accession #:    [id number]       Weight:       110.0 lb Date of Birth:  12-28-1975        BSA:          1.517 m Patient Age:    46 years         BP:           112/82 mmHg Patient Gender: F                HR:           108 bpm. Exam Location:  Inpatient Procedure: Limited Echo, Color Doppler and Cardiac Doppler Indications:    Ventricular Tachycardia  History:        Patient has prior history of Echocardiogram examinations, most                 recent 09/18/2022. Arrythmias:Tachycardia.  Sonographer:    Milbert Coulter Referring Phys: [id number] SUBRINA SUNDIL  Sonographer Comments: Suboptimal apical window. Image acquisition challenging due to breast implants. IMPRESSIONS  1. Left ventricular ejection fraction, by estimation, is 60 to 65%. The left ventricle has normal function. The left ventricle has no regional wall motion abnormalities. Left ventricular diastolic parameters are indeterminate.  2. Right ventricular systolic function is normal. The right ventricular size is normal.  3. The mitral valve is normal in structure. No evidence of mitral valve regurgitation. No evidence of mitral stenosis.  4. The aortic valve is normal in structure. Aortic valve regurgitation is not visualized. No aortic stenosis is present.  5. The inferior vena cava is normal in size with greater than 50% respiratory variability, suggesting right atrial pressure of 3 mmHg. FINDINGS  Left Ventricle: Left ventricular ejection fraction, by estimation, is 60 to 65%. The left ventricle has normal function. The left ventricle has no regional wall motion abnormalities. The left ventricular internal cavity size was normal in size. There is  no left ventricular hypertrophy. Left  ventricular diastolic parameters are indeterminate. Right Ventricle: The right ventricular size is normal. No increase in right ventricular wall thickness. Right ventricular systolic function is normal. Left Atrium: Left atrial size was normal in size. Right Atrium: Right atrial size was normal in size. Pericardium: Trivial pericardial effusion is present. The pericardial effusion is circumferential. Mitral Valve: The mitral valve is normal in structure. No evidence of mitral valve stenosis. Tricuspid Valve: The tricuspid valve is normal in structure. Tricuspid valve regurgitation is not demonstrated. No evidence of tricuspid stenosis. Aortic Valve: The aortic valve is normal in structure. Aortic valve regurgitation is not visualized. No aortic stenosis is present. Pulmonic Valve: The pulmonic valve was normal in structure. Pulmonic valve regurgitation is not visualized. No evidence of pulmonic stenosis. Aorta: The aortic root is normal in size and structure. Venous: The inferior vena cava is normal in size with greater than 50% respiratory variability, suggesting right atrial pressure of 3 mmHg. IAS/Shunts: No atrial level shunt detected by color flow Doppler. Additional Comments: Spectral Doppler performed. Color Doppler performed.  LEFT VENTRICLE PLAX 2D LVIDd:         3.50 cm LVIDs:         2.20 cm LV PW:         0.90 cm LV IVS:        0.80 cm LVOT diam:     1.80 cm LVOT Area:     2.54 cm  LEFT ATRIUM         Index LA diam:    2.70 cm 1.78 cm/m   AORTA Ao Root diam: 2.80 cm  SHUNTS Systemic Diam: 1.80 cm Kardie Tobb DO Electronically signed by Thomasene Ripple DO Signature Date/Time: 11/19/2022/12:23:02 PM    Final    CT ABDOMEN PELVIS W CONTRAST  Result Date: 11/18/2022 CLINICAL DATA:  History of breast cancer on chemotherapy with loss of appetite. * Tracking Code: BO * EXAM: CT ANGIOGRAPHY CHEST CT ABDOMEN AND PELVIS WITH CONTRAST TECHNIQUE: Multidetector CT imaging of the chest was performed using the  standard protocol during bolus administration of intravenous contrast. Multiplanar CT image reconstructions and MIPs were obtained to evaluate the vascular anatomy. Multidetector CT imaging of the abdomen and pelvis was performed using the standard protocol during bolus administration of intravenous contrast. RADIATION DOSE REDUCTION: This exam was performed according to the departmental dose-optimization program which includes automated exposure control, adjustment of the mA and/or kV according to patient size and/or use of iterative reconstruction technique. CONTRAST:  OMNIPAQUE IOHEXOL 300 MG/ML  SOLN COMPARISON:  CT abdomen and pelvis dated 09/27/2022, CT  chest dated 04/06/2022 FINDINGS: CTA CHEST FINDINGS Cardiovascular: Left chest wall port terminates in the right atrium. The study is high quality for the evaluation of pulmonary embolism. There are no filling defects in the central, lobar, segmental or subsegmental pulmonary artery branches to suggest acute pulmonary embolism. Anatomic variant common origin of the brachiocephalic and common carotid arteries. Normal heart size. No significant pericardial fluid/thickening. Mediastinum/Nodes: Imaged thyroid gland without nodules meeting criteria for imaging follow-up by size. Normal esophagus. No pathologically enlarged axillary, supraclavicular, mediastinal, or hilar lymph nodes. Lungs/Pleura: The central airways are patent. Unchanged subpleural ground-glass densities along the anterior right upper and middle lobe. Unchanged small area of perifissural ground-glass opacity is also seen in the anterior right lower lobe. Unchanged 3 mm basilar left lower lobe nodule (6:88). No pneumothorax. No pleural effusion. Musculoskeletal: No acute or abnormal lytic or blastic osseous lesions. Right mastectomy. Left breast implant. Review of the MIP images confirms the above findings. CT ABDOMEN and PELVIS FINDINGS Hepatobiliary: Subcentimeter hypoattenuating focus in  segment 5 (2:23) too small to characterize but unchanged. No intra or extrahepatic biliary ductal dilation. Normal gallbladder. Pancreas: No focal lesions or main ductal dilation. Spleen: Normal in size without focal abnormality. Adrenals/Urinary Tract: No adrenal nodules. No suspicious renal mass, calculi, or hydronephrosis. No focal bladder wall thickening. Stomach/Bowel: Normal appearance of the stomach. Status post subtotal colectomy with ileosigmoid anastomosis. Marked long segment small bowel dilation demonstrating mild mural thickening, similar to 09/27/2022, with abrupt caliber transition near the ileosigmoid anastomosis (5:51). Vascular/Lymphatic: No significant vascular findings are present. No enlarged abdominal or pelvic lymph nodes. Reproductive: Enlarged and lobulated uterus containing multifocal enhancing masses, including a few with coarse calcifications. No adnexal masses. Other: No free fluid, fluid collection, or free air. Musculoskeletal: No acute or abnormal lytic or blastic osseous lesions. IMPRESSION: 1. No evidence of pulmonary embolism. 2. Status post subtotal colectomy with marked long segment small bowel dilation demonstrating mild mural thickening, similar to 09/27/2022, with abrupt caliber transition near the ileosigmoid anastomosis, suspicious for small-bowel obstruction. 3. Unchanged subpleural ground-glass densities along the anterior right upper and middle lobe, likely postradiation change. 4. 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 5. Unchanged 3 mm basilar left lower lobe nodule. Electronically Signed   By: Agustin Cree M.D.   On: 11/18/2022 18:33   CT Angio Chest PE W/Cm &/Or Wo Cm  Result Date: 11/18/2022 CLINICAL DATA:  History of breast cancer on chemotherapy with loss of appetite. * Tracking Code: BO * EXAM: CT ANGIOGRAPHY CHEST CT ABDOMEN AND PELVIS WITH CONTRAST  TECHNIQUE: Multidetector CT imaging of the chest was performed using the standard protocol during bolus administration of intravenous contrast. Multiplanar CT image reconstructions and MIPs were obtained to evaluate the vascular anatomy. Multidetector CT imaging of the abdomen and pelvis was performed using the standard protocol during bolus administration of intravenous contrast. RADIATION DOSE REDUCTION: This exam was performed according to the departmental dose-optimization program which includes automated exposure control, adjustment of the mA and/or kV according to patient size and/or use of iterative reconstruction technique. CONTRAST:  OMNIPAQUE IOHEXOL 300 MG/ML  SOLN COMPARISON:  CT abdomen and pelvis dated 09/27/2022, CT chest dated 04/06/2022 FINDINGS: CTA CHEST FINDINGS Cardiovascular: Left chest wall port terminates in the right atrium. The study is high quality for the evaluation of pulmonary embolism. There are no filling defects in the central, lobar, segmental or subsegmental pulmonary artery branches to suggest acute pulmonary embolism.  Anatomic variant common origin of the brachiocephalic and common carotid arteries. Normal heart size. No significant pericardial fluid/thickening. Mediastinum/Nodes: Imaged thyroid gland without nodules meeting criteria for imaging follow-up by size. Normal esophagus. No pathologically enlarged axillary, supraclavicular, mediastinal, or hilar lymph nodes. Lungs/Pleura: The central airways are patent. Unchanged subpleural ground-glass densities along the anterior right upper and middle lobe. Unchanged small area of perifissural ground-glass opacity is also seen in the anterior right lower lobe. Unchanged 3 mm basilar left lower lobe nodule (6:88). No pneumothorax. No pleural effusion. Musculoskeletal: No acute or abnormal lytic or blastic osseous lesions. Right mastectomy. Left breast implant. Review of the MIP images confirms the above findings. CT ABDOMEN and  PELVIS FINDINGS Hepatobiliary: Subcentimeter hypoattenuating focus in segment 5 (2:23) too small to characterize but unchanged. No intra or extrahepatic biliary ductal dilation. Normal gallbladder. Pancreas: No focal lesions or main ductal dilation. Spleen: Normal in size without focal abnormality. Adrenals/Urinary Tract: No adrenal nodules. No suspicious renal mass, calculi, or hydronephrosis. No focal bladder wall thickening. Stomach/Bowel: Normal appearance of the stomach. Status post subtotal colectomy with ileosigmoid anastomosis. Marked long segment small bowel dilation demonstrating mild mural thickening, similar to 09/27/2022, with abrupt caliber transition near the ileosigmoid anastomosis (5:51). Vascular/Lymphatic: No significant vascular findings are present. No enlarged abdominal or pelvic lymph nodes. Reproductive: Enlarged and lobulated uterus containing multifocal enhancing masses, including a few with coarse calcifications. No adnexal masses. Other: No free fluid, fluid collection, or free air. Musculoskeletal: No acute or abnormal lytic or blastic osseous lesions. IMPRESSION: 1. No evidence of pulmonary embolism. 2. Status post subtotal colectomy with marked long segment small bowel dilation demonstrating mild mural thickening, similar to 09/27/2022, with abrupt caliber transition near the ileosigmoid anastomosis, suspicious for small-bowel obstruction. 3. Unchanged subpleural ground-glass densities along the anterior right upper and middle lobe, likely postradiation change. 4. 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 5. Unchanged 3 mm basilar left lower lobe nodule. Electronically Signed   By: Agustin Cree M.D.   On: 11/18/2022 18:33    Assessment / Plan:  47 y.o. female with a history of Crohn's disease status post subtotal colectomy with ileal colonic anastomosis narrowing, previously on  Entyvio, who was admitted to the hospital  11/19/2022 with recurrent SBO. CTAP with contrast 11/18/2022 showed evidence of past subtotal colectomy with marked long segment small bowel dilation demonstrating mild mural thickening with abrupt caliber transition near the ileosigmoid anastomosis, suspicious for small-bowel obstruction. -Flexible sigmoidoscopy to rule out active persistent Crohn's disease in the setting of being off all biologic therapy verses fibrostenotic disease. Flexible sigmoidoscopy tentative scheduled with our advanced endoscopist Dr. Meridee Score on Tuesday 11/21/2022 as she may require enteral Axious stenting -EGD to rule out UGI Crohn's at time of flexible sigmoidoscopy  -Full liquid diet for breakfast and lunch, then clear liquids this evening -NPO after midnight  -IV fluids and pain management per the hospitalist  -CBC, BMP and CRP in am  Acute on chronic anemia. Admission Hg 10.1 -> 9.2 -> today Hg 8.9. MCV 79.2. Iron 22. TIBC 118. Ferritin 160. Vitamin B12 1,244. No overt GI bleeding.  -See plan above   Hyponatremia. Na+ 131.  History of breasts cancer 05/2020 s/p bilateral mastectomies, chemotherapy and radiation    Principal Problem:   Failure to thrive in adult Active Problems:   Generalized abdominal pain   History of Crohn's disease   Crohn's disease of small and large intestines with complication (  HCC)   History of abdominal subtotal colectomy   Hypokalemia   Chronic disease anemia   Protein-calorie malnutrition, moderate (HCC)   Anorexia   Nausea   Abnormal CT scan, gastrointestinal tract   Colonic stricture (HCC)     LOS: 2 days   Arnaldo Natal  11/20/2022, 10:38AM   Attending physician's note   I have taken history, reviewed the chart and examined the patient. I performed a substantive portion of this encounter, including complete performance of at least one of the key components, in conjunction with the APP. I agree with the Advanced  Practitioner's note, impression and recommendations.   Received signout from Dr. Meridee Score.   No further N/V. Abdo pain is better  For FS with +/- Axios stent and EGD tomorrow with Dr Judie Petit  If significant Crohn's, resume Entyvio (after loading dose IV 300mg  week 0, 2, she would prefer SQ PEN 108mg  Q 2 weekly.  Does not want p.o. MiraLAX for prep.  Hence, only enemas.     Edman Circle, MD [facility] [phone]

## 2022-11-20 NOTE — Progress Notes (Addendum)
PROGRESS NOTE    [name]  NWG:[id number] DOB: January 19, 1976 DOA: 11/18/2022 PCP: Ollen Bowl, MD   Brief Narrative: 47 year old with past medical history significant for recent small bowel obstruction 6//20 04/2022, breast cancer in remission, Crohn's disease status post subtotal colectomy 2021, grade 1 diastolic dysfunction, presents complaining of poor oral intake for the last 2 or 3 weeks.  Completed chemotherapy 6 weeks ago.  Reported bowel movement which are irregular and the stools are pebble-like.  Reported abdominal pain.  CT abdomen and pelvis showed status post subtotal colectomy with MAC long segment small bowel dilation demonstrating mild-moderate thickening, similar to 09/27/2022 with abrupt caliber transition near the ileal sigmoid anastomosis, suspicious for small bowel obstruction.   Assessment & Plan:   Principal Problem:   Failure to thrive in adult Active Problems:   History of abdominal subtotal colectomy   History of Crohn's disease   Chronic disease anemia   Crohn's disease of small and large intestines with complication (HCC)   Generalized abdominal pain   Hypokalemia   Protein-calorie malnutrition, moderate (HCC)   Anorexia   Nausea   Abnormal CT scan, gastrointestinal tract   Colonic stricture (HCC)   1-Small bowel obstruction History of Crohn's disease Status post subtotal colectomy 2021 -Patient presented with abdominal pain, poor oral intake, constipation.  CT scanning with concern for SBO. -CT abdomen: Status post subtotal colectomy with marked long segment small bowel dilation demonstrating mild mural thickening, similar to 09/27/2022, with abrupt caliber transition near the ileosigmoid anastomosis, suspicious for small-bowel obstruction. -General Surgery consulted -GI consulted -Continue with IV fluids -Plan for sigmoidoscopy tomorrow.  -PPI prophylaxis.   Hypokalemia: -Replaced.   Hypomagnesemia: -Replaced.   Chronic  constipation: -defer Bowel regimen to GI  Breast cancer; Under the care of of Dr. Pamelia Hoit.  Last chemotherapy 6 weeks ago  Iron deficiency anemia; start iron supplement when tolerates diet   Grade 1 diastolic dysfunction -Monitor volume   Sinus tachycardia: -In setting hypovolemia, dehydration.  -IV bolus.  -Continue with IV fluids.  -CTA chest negative for PE -Will check TSH.   Oral thrush:  Started Nystatin.   Hypoalbuminemia; Will need protein supplement when able to tolerate oral.       Estimated body mass index is 18.43 kg/m as calculated from the following:   Height as of this encounter: 5\' 4"  (1.626 m).   Weight as of this encounter: 48.7 kg.   DVT prophylaxis: hold heparin for possible scope.  Code Status: Full code Family Communication: Care discussed with patient.  Disposition Plan:  Status is: Inpatient Remains inpatient appropriate because: management of SBP, dehydration.     Consultants:  General Sx GI  Procedures:    Antimicrobials:    Subjective: She report abdominal pain. Has had 2 BM today   Objective: Vitals:   11/20/22 0146 11/20/22 0500 11/20/22 0612 11/20/22 1240  BP: (!) 127/96  121/83 126/87  Pulse: (!) 116  (!) 124 (!) 120  Resp: 17  18 14   Temp: 99.1 F (37.3 C)  99 F (37.2 C) 99.2 F (37.3 C)  TempSrc: Oral  Oral Oral  SpO2: 99%  98% 100%  Weight:  48.7 kg    Height:        Intake/Output Summary (Last 24 hours) at 11/20/2022 1351 Last data filed at 11/20/2022 1000 Gross per 24 hour  Intake 2903.69 ml  Output 2100 ml  Net 803.69 ml   Filed Weights   11/18/22 1551 11/20/22 0500  Weight: 49.9  kg 48.7 kg    Examination:  General exam: NAD Respiratory system: CTA Cardiovascular system: S 1, S 2  RRR Gastrointestinal system: BS present, soft, nt Central nervous system: alert, oriented . Extremities: no edema   Data Reviewed: I have personally reviewed following labs and imaging studies  CBC: Recent  Labs  Lab 11/18/22 1707 11/19/22 0743 11/20/22 0252  WBC 8.8 7.7 7.2  NEUTROABS 6.0  --   --   HGB 10.1* 9.2* 8.9*  HCT 30.6* 28.9* 28.1*  MCV 76.7* 78.7* 79.2*  PLT 391 364 361   Basic Metabolic Panel: Recent Labs  Lab 11/18/22 1707 11/19/22 0044 11/19/22 0743 11/20/22 0252  NA 137  --  132* 131*  K 3.2*  --  3.7 3.6  CL 101  --  101 98  CO2 25  --  20* 24  GLUCOSE 88  --  78 82  BUN 10  --  7 7  CREATININE 0.61  --  0.66 0.66  CALCIUM 8.4*  --  8.0* 7.6*  MG  --  1.8  --   --    GFR: Estimated Creatinine Clearance: 69 mL/min (by C-G formula based on SCr of 0.66 mg/dL). Liver Function Tests: Recent Labs  Lab 11/18/22 1707  AST 9*  ALT 6  ALKPHOS 106  BILITOT 0.6  PROT 5.9*  ALBUMIN 2.9*   Recent Labs  Lab 11/18/22 1707  LIPASE 11   No results for input(s): "AMMONIA" in the last 168 hours. Coagulation Profile: Recent Labs  Lab 11/20/22 0252  INR 1.1   Cardiac Enzymes: No results for input(s): "CKTOTAL", "CKMB", "CKMBINDEX", "TROPONINI" in the last 168 hours. BNP (last 3 results) No results for input(s): "PROBNP" in the last 8760 hours. HbA1C: No results for input(s): "HGBA1C" in the last 72 hours. CBG: No results for input(s): "GLUCAP" in the last 168 hours. Lipid Profile: No results for input(s): "CHOL", "HDL", "LDLCALC", "TRIG", "CHOLHDL", "LDLDIRECT" in the last 72 hours. Thyroid Function Tests: No results for input(s): "TSH", "T4TOTAL", "FREET4", "T3FREE", "THYROIDAB" in the last 72 hours. Anemia Panel: Recent Labs    11/20/22 0252  VITAMINB12 1,244*  FOLATE 16.2  FERRITIN 160  TIBC 118*  IRON 22*  RETICCTPCT 0.9   Sepsis Labs: Recent Labs  Lab 11/18/22 1707  LATICACIDVEN 0.6    Recent Results (from the past 240 hour(s))  Culture, blood (routine x 2)     Status: None (Preliminary result)   Collection Time: 11/18/22 11:58 PM   Specimen: BLOOD LEFT ARM  Result Value Ref Range Status   Specimen Description   Final    BLOOD  LEFT ARM Performed at [facility], [address] [place], [place] [address]    Special Requests   Final    BOTTLES DRAWN AEROBIC AND ANAEROBIC Blood Culture adequate volume Performed at [facility], [address], [place], [place] [address]    Culture   Final    NO GROWTH < 24 HOURS Performed at [facility], [address], [place], [place] [address]    Report Status PENDING  Incomplete  Culture, blood (routine x 2)     Status: None (Preliminary result)   Collection Time: 11/18/22 11:58 PM   Specimen: BLOOD LEFT ARM  Result Value Ref Range Status   Specimen Description   Final    BLOOD LEFT ARM Performed at [facility], [address] [place], [place] [address]    Special Requests  Final    BOTTLES DRAWN AEROBIC AND ANAEROBIC Blood Culture adequate volume Performed at [facility], [address] [place], [place] [address]    Culture   Final    NO GROWTH < 24 HOURS Performed at [facility], [address], [place], [place] [address]    Report Status PENDING  Incomplete         Radiology Studies: ECHOCARDIOGRAM LIMITED  Result Date: 11/19/2022    ECHOCARDIOGRAM LIMITED REPORT   Patient Name:   LYDIAH LAUDER Date of Exam: 11/19/2022 Medical Rec #:  [id number]        Height:       64.0 in Accession #:    [id number]       Weight:       110.0 lb Date of Birth:  09-Dec-1975        BSA:          1.517 m Patient Age:    46 years         BP:           112/82 mmHg Patient Gender: F                HR:           108 bpm. Exam Location:  Inpatient Procedure: Limited Echo, Color Doppler and Cardiac Doppler Indications:    Ventricular Tachycardia  History:        Patient has prior history of Echocardiogram examinations, most                 recent 09/18/2022. Arrythmias:Tachycardia.  Sonographer:    Milbert Coulter Referring Phys: [id number] SUBRINA SUNDIL  Sonographer Comments: Suboptimal apical  window. Image acquisition challenging due to breast implants. IMPRESSIONS  1. Left ventricular ejection fraction, by estimation, is 60 to 65%. The left ventricle has normal function. The left ventricle has no regional wall motion abnormalities. Left ventricular diastolic parameters are indeterminate.  2. Right ventricular systolic function is normal. The right ventricular size is normal.  3. The mitral valve is normal in structure. No evidence of mitral valve regurgitation. No evidence of mitral stenosis.  4. The aortic valve is normal in structure. Aortic valve regurgitation is not visualized. No aortic stenosis is present.  5. The inferior vena cava is normal in size with greater than 50% respiratory variability, suggesting right atrial pressure of 3 mmHg. FINDINGS  Left Ventricle: Left ventricular ejection fraction, by estimation, is 60 to 65%. The left ventricle has normal function. The left ventricle has no regional wall motion abnormalities. The left ventricular internal cavity size was normal in size. There is  no left ventricular hypertrophy. Left ventricular diastolic parameters are indeterminate. Right Ventricle: The right ventricular size is normal. No increase in right ventricular wall thickness. Right ventricular systolic function is normal. Left Atrium: Left atrial size was normal in size. Right Atrium: Right atrial size was normal in size. Pericardium: Trivial pericardial effusion is present. The pericardial effusion is circumferential. Mitral Valve: The mitral valve is normal in structure. No evidence of mitral valve stenosis. Tricuspid Valve: The tricuspid valve is normal in structure. Tricuspid valve regurgitation is not demonstrated. No evidence of tricuspid stenosis. Aortic Valve: The aortic valve is normal in structure. Aortic valve regurgitation is not visualized. No aortic stenosis is present. Pulmonic Valve: The pulmonic valve was normal in structure. Pulmonic valve regurgitation is not  visualized. No evidence of pulmonic stenosis. Aorta: The aortic root is normal in size and  structure. Venous: The inferior vena cava is normal in size with greater than 50% respiratory variability, suggesting right atrial pressure of 3 mmHg. IAS/Shunts: No atrial level shunt detected by color flow Doppler. Additional Comments: Spectral Doppler performed. Color Doppler performed.  LEFT VENTRICLE PLAX 2D LVIDd:         3.50 cm LVIDs:         2.20 cm LV PW:         0.90 cm LV IVS:        0.80 cm LVOT diam:     1.80 cm LVOT Area:     2.54 cm  LEFT ATRIUM         Index LA diam:    2.70 cm 1.78 cm/m   AORTA Ao Root diam: 2.80 cm  SHUNTS Systemic Diam: 1.80 cm Kardie Tobb DO Electronically signed by Thomasene Ripple DO Signature Date/Time: 11/19/2022/12:23:02 PM    Final    CT ABDOMEN PELVIS W CONTRAST  Result Date: 11/18/2022 CLINICAL DATA:  History of breast cancer on chemotherapy with loss of appetite. * Tracking Code: BO * EXAM: CT ANGIOGRAPHY CHEST CT ABDOMEN AND PELVIS WITH CONTRAST TECHNIQUE: Multidetector CT imaging of the chest was performed using the standard protocol during bolus administration of intravenous contrast. Multiplanar CT image reconstructions and MIPs were obtained to evaluate the vascular anatomy. Multidetector CT imaging of the abdomen and pelvis was performed using the standard protocol during bolus administration of intravenous contrast. RADIATION DOSE REDUCTION: This exam was performed according to the departmental dose-optimization program which includes automated exposure control, adjustment of the mA and/or kV according to patient size and/or use of iterative reconstruction technique. CONTRAST:  OMNIPAQUE IOHEXOL 300 MG/ML  SOLN COMPARISON:  CT abdomen and pelvis dated 09/27/2022, CT chest dated 04/06/2022 FINDINGS: CTA CHEST FINDINGS Cardiovascular: Left chest wall port terminates in the right atrium. The study is high quality for the evaluation of pulmonary embolism. There are no  filling defects in the central, lobar, segmental or subsegmental pulmonary artery branches to suggest acute pulmonary embolism. Anatomic variant common origin of the brachiocephalic and common carotid arteries. Normal heart size. No significant pericardial fluid/thickening. Mediastinum/Nodes: Imaged thyroid gland without nodules meeting criteria for imaging follow-up by size. Normal esophagus. No pathologically enlarged axillary, supraclavicular, mediastinal, or hilar lymph nodes. Lungs/Pleura: The central airways are patent. Unchanged subpleural ground-glass densities along the anterior right upper and middle lobe. Unchanged small area of perifissural ground-glass opacity is also seen in the anterior right lower lobe. Unchanged 3 mm basilar left lower lobe nodule (6:88). No pneumothorax. No pleural effusion. Musculoskeletal: No acute or abnormal lytic or blastic osseous lesions. Right mastectomy. Left breast implant. Review of the MIP images confirms the above findings. CT ABDOMEN and PELVIS FINDINGS Hepatobiliary: Subcentimeter hypoattenuating focus in segment 5 (2:23) too small to characterize but unchanged. No intra or extrahepatic biliary ductal dilation. Normal gallbladder. Pancreas: No focal lesions or main ductal dilation. Spleen: Normal in size without focal abnormality. Adrenals/Urinary Tract: No adrenal nodules. No suspicious renal mass, calculi, or hydronephrosis. No focal bladder wall thickening. Stomach/Bowel: Normal appearance of the stomach. Status post subtotal colectomy with ileosigmoid anastomosis. Marked long segment small bowel dilation demonstrating mild mural thickening, similar to 09/27/2022, with abrupt caliber transition near the ileosigmoid anastomosis (5:51). Vascular/Lymphatic: No significant vascular findings are present. No enlarged abdominal or pelvic lymph nodes. Reproductive: Enlarged and lobulated uterus containing multifocal enhancing masses, including a few with coarse  calcifications. No adnexal masses. Other: No free fluid, fluid  collection, or free air. Musculoskeletal: No acute or abnormal lytic or blastic osseous lesions. IMPRESSION: 1. No evidence of pulmonary embolism. 2. Status post subtotal colectomy with marked long segment small bowel dilation demonstrating mild mural thickening, similar to 09/27/2022, with abrupt caliber transition near the ileosigmoid anastomosis, suspicious for small-bowel obstruction. 3. Unchanged subpleural ground-glass densities along the anterior right upper and middle lobe, likely postradiation change. 4. 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 5. Unchanged 3 mm basilar left lower lobe nodule. Electronically Signed   By: Agustin Cree M.D.   On: 11/18/2022 18:33   CT Angio Chest PE W/Cm &/Or Wo Cm  Result Date: 11/18/2022 CLINICAL DATA:  History of breast cancer on chemotherapy with loss of appetite. * Tracking Code: BO * EXAM: CT ANGIOGRAPHY CHEST CT ABDOMEN AND PELVIS WITH CONTRAST TECHNIQUE: Multidetector CT imaging of the chest was performed using the standard protocol during bolus administration of intravenous contrast. Multiplanar CT image reconstructions and MIPs were obtained to evaluate the vascular anatomy. Multidetector CT imaging of the abdomen and pelvis was performed using the standard protocol during bolus administration of intravenous contrast. RADIATION DOSE REDUCTION: This exam was performed according to the departmental dose-optimization program which includes automated exposure control, adjustment of the mA and/or kV according to patient size and/or use of iterative reconstruction technique. CONTRAST:  OMNIPAQUE IOHEXOL 300 MG/ML  SOLN COMPARISON:  CT abdomen and pelvis dated 09/27/2022, CT chest dated 04/06/2022 FINDINGS: CTA CHEST FINDINGS Cardiovascular: Left chest wall port terminates in the right atrium. The study  is high quality for the evaluation of pulmonary embolism. There are no filling defects in the central, lobar, segmental or subsegmental pulmonary artery branches to suggest acute pulmonary embolism. Anatomic variant common origin of the brachiocephalic and common carotid arteries. Normal heart size. No significant pericardial fluid/thickening. Mediastinum/Nodes: Imaged thyroid gland without nodules meeting criteria for imaging follow-up by size. Normal esophagus. No pathologically enlarged axillary, supraclavicular, mediastinal, or hilar lymph nodes. Lungs/Pleura: The central airways are patent. Unchanged subpleural ground-glass densities along the anterior right upper and middle lobe. Unchanged small area of perifissural ground-glass opacity is also seen in the anterior right lower lobe. Unchanged 3 mm basilar left lower lobe nodule (6:88). No pneumothorax. No pleural effusion. Musculoskeletal: No acute or abnormal lytic or blastic osseous lesions. Right mastectomy. Left breast implant. Review of the MIP images confirms the above findings. CT ABDOMEN and PELVIS FINDINGS Hepatobiliary: Subcentimeter hypoattenuating focus in segment 5 (2:23) too small to characterize but unchanged. No intra or extrahepatic biliary ductal dilation. Normal gallbladder. Pancreas: No focal lesions or main ductal dilation. Spleen: Normal in size without focal abnormality. Adrenals/Urinary Tract: No adrenal nodules. No suspicious renal mass, calculi, or hydronephrosis. No focal bladder wall thickening. Stomach/Bowel: Normal appearance of the stomach. Status post subtotal colectomy with ileosigmoid anastomosis. Marked long segment small bowel dilation demonstrating mild mural thickening, similar to 09/27/2022, with abrupt caliber transition near the ileosigmoid anastomosis (5:51). Vascular/Lymphatic: No significant vascular findings are present. No enlarged abdominal or pelvic lymph nodes. Reproductive: Enlarged and lobulated uterus  containing multifocal enhancing masses, including a few with coarse calcifications. No adnexal masses. Other: No free fluid, fluid collection, or free air. Musculoskeletal: No acute or abnormal lytic or blastic osseous lesions. IMPRESSION: 1. No evidence of pulmonary embolism. 2. Status post subtotal colectomy with marked long segment small bowel dilation demonstrating mild mural thickening, similar to 09/27/2022, with abrupt caliber transition near the ileosigmoid anastomosis,  suspicious for small-bowel obstruction. 3. Unchanged subpleural ground-glass densities along the anterior right upper and middle lobe, likely postradiation change. 4. 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 5. Unchanged 3 mm basilar left lower lobe nodule. Electronically Signed   By: Agustin Cree M.D.   On: 11/18/2022 18:33        Scheduled Meds:  Chlorhexidine Gluconate Cloth  6 each Topical Daily   nystatin  5 mL Oral QID   pantoprazole (PROTONIX) IV  40 mg Intravenous Daily   sodium chloride flush  3 mL Intravenous Q12H   Continuous Infusions:  sodium chloride     lactated ringers 1,000 mL with potassium chloride 10 mEq infusion 100 mL/hr at 11/20/22 0910   lactated ringers       LOS: 2 days    Time spent: 35 minutes     A , MD [facility]   If 7PM-7AM, please contact night-coverage [email or web address]  11/20/2022, 1:51 PM

## 2022-11-20 NOTE — Progress Notes (Signed)
Progress Note     Subjective: Pt reports she is tolerating liquids and having bowel movements. She does still have some abdominal pain. Denies nausea or vomiting this AM.   Objective: Vital signs in last 24 hours: Temp:  [98.1 F (36.7 C)-99.1 F (37.3 C)] 99 F (37.2 C) (08/12 0612) Pulse Rate:  [116-124] 124 (08/12 0612) Resp:  [16-20] 18 (08/12 0612) BP: (119-127)/(83-96) 121/83 (08/12 0612) SpO2:  [98 %-100 %] 98 % (08/12 0612) Weight:  [48.7 kg] 48.7 kg (08/12 0500) Last BM Date : 11/19/22  Intake/Output from previous day: 08/11 0701 - 08/12 0700 In: 3426.5 [P.O.:240; I.V.:2186.5; IV Piggyback:1000] Out: 2150 [Urine:2150] Intake/Output this shift: Total I/O In: 0  Out: 450 [Urine:450]  PE: General: pleasant, WD, thin female who is laying in bed in NAD Lungs: Respiratory effort nonlabored Abd: soft, mild generalized ttp without peritonitis, mild distention, +BS, midline surgical scar well healed Psych: A&Ox3 with an appropriate affect.    Lab Results:  Recent Labs    11/19/22 0743 11/20/22 0252  WBC 7.7 7.2  HGB 9.2* 8.9*  HCT 28.9* 28.1*  PLT 364 361   BMET Recent Labs    11/19/22 0743 11/20/22 0252  NA 132* 131*  K 3.7 3.6  CL 101 98  CO2 20* 24  GLUCOSE 78 82  BUN 7 7  CREATININE 0.66 0.66  CALCIUM 8.0* 7.6*   PT/INR Recent Labs    11/20/22 0252  LABPROT 14.9  INR 1.1   CMP     Component Value Date/Time   NA 131 (L) 11/20/2022 0252   NA 135 01/21/2020 1056   K 3.6 11/20/2022 0252   CL 98 11/20/2022 0252   CO2 24 11/20/2022 0252   GLUCOSE 82 11/20/2022 0252   BUN 7 11/20/2022 0252   BUN 12 01/21/2020 1056   CREATININE 0.66 11/20/2022 0252   CREATININE 0.91 09/21/2022 1141   CALCIUM 7.6 (L) 11/20/2022 0252   PROT 5.9 (L) 11/18/2022 1707   ALBUMIN 2.9 (L) 11/18/2022 1707   AST 9 (L) 11/18/2022 1707   AST 18 09/21/2022 1141   ALT 6 11/18/2022 1707   ALT 12 09/21/2022 1141   ALKPHOS 106 11/18/2022 1707   BILITOT 0.6  11/18/2022 1707   BILITOT 0.4 09/21/2022 1141   GFRNONAA >60 11/20/2022 0252   GFRNONAA >60 09/21/2022 1141   GFRAA 108 01/21/2020 1056   Lipase     Component Value Date/Time   LIPASE 11 11/18/2022 1707       Studies/Results: ECHOCARDIOGRAM LIMITED  Result Date: 11/19/2022    ECHOCARDIOGRAM LIMITED REPORT   Patient Name:   [name] Date of Exam: 11/19/2022 Medical Rec #:  [id number]        Height:       64.0 in Accession #:    [id number]       Weight:       110.0 lb Date of Birth:  1975/12/05        BSA:          1.517 m Patient Age:    46 years         BP:           112/82 mmHg Patient Gender: F                HR:           108 bpm. Exam Location:  Inpatient Procedure: Limited Echo, Color Doppler and Cardiac Doppler Indications:    Ventricular Tachycardia  History:        Patient has prior history of Echocardiogram examinations, most                 recent 09/18/2022. Arrythmias:Tachycardia.  Sonographer:    Milbert Coulter Referring Phys: [id number] SUBRINA SUNDIL  Sonographer Comments: Suboptimal apical window. Image acquisition challenging due to breast implants. IMPRESSIONS  1. Left ventricular ejection fraction, by estimation, is 60 to 65%. The left ventricle has normal function. The left ventricle has no regional wall motion abnormalities. Left ventricular diastolic parameters are indeterminate.  2. Right ventricular systolic function is normal. The right ventricular size is normal.  3. The mitral valve is normal in structure. No evidence of mitral valve regurgitation. No evidence of mitral stenosis.  4. The aortic valve is normal in structure. Aortic valve regurgitation is not visualized. No aortic stenosis is present.  5. The inferior vena cava is normal in size with greater than 50% respiratory variability, suggesting right atrial pressure of 3 mmHg. FINDINGS  Left Ventricle: Left ventricular ejection fraction, by estimation, is 60 to 65%. The left ventricle has normal function. The left  ventricle has no regional wall motion abnormalities. The left ventricular internal cavity size was normal in size. There is  no left ventricular hypertrophy. Left ventricular diastolic parameters are indeterminate. Right Ventricle: The right ventricular size is normal. No increase in right ventricular wall thickness. Right ventricular systolic function is normal. Left Atrium: Left atrial size was normal in size. Right Atrium: Right atrial size was normal in size. Pericardium: Trivial pericardial effusion is present. The pericardial effusion is circumferential. Mitral Valve: The mitral valve is normal in structure. No evidence of mitral valve stenosis. Tricuspid Valve: The tricuspid valve is normal in structure. Tricuspid valve regurgitation is not demonstrated. No evidence of tricuspid stenosis. Aortic Valve: The aortic valve is normal in structure. Aortic valve regurgitation is not visualized. No aortic stenosis is present. Pulmonic Valve: The pulmonic valve was normal in structure. Pulmonic valve regurgitation is not visualized. No evidence of pulmonic stenosis. Aorta: The aortic root is normal in size and structure. Venous: The inferior vena cava is normal in size with greater than 50% respiratory variability, suggesting right atrial pressure of 3 mmHg. IAS/Shunts: No atrial level shunt detected by color flow Doppler. Additional Comments: Spectral Doppler performed. Color Doppler performed.  LEFT VENTRICLE PLAX 2D LVIDd:         3.50 cm LVIDs:         2.20 cm LV PW:         0.90 cm LV IVS:        0.80 cm LVOT diam:     1.80 cm LVOT Area:     2.54 cm  LEFT ATRIUM         Index LA diam:    2.70 cm 1.78 cm/m   AORTA Ao Root diam: 2.80 cm  SHUNTS Systemic Diam: 1.80 cm Kardie Tobb DO Electronically signed by Thomasene Ripple DO Signature Date/Time: 11/19/2022/12:23:02 PM    Final    CT ABDOMEN PELVIS W CONTRAST  Result Date: 11/18/2022 CLINICAL DATA:  History of breast cancer on chemotherapy with loss of appetite. *  Tracking Code: BO * EXAM: CT ANGIOGRAPHY CHEST CT ABDOMEN AND PELVIS WITH CONTRAST TECHNIQUE: Multidetector CT imaging of the chest was performed using the standard protocol during bolus administration of intravenous contrast. Multiplanar CT image reconstructions and MIPs were obtained to evaluate the vascular anatomy. Multidetector CT imaging of the abdomen and pelvis was performed  using the standard protocol during bolus administration of intravenous contrast. RADIATION DOSE REDUCTION: This exam was performed according to the departmental dose-optimization program which includes automated exposure control, adjustment of the mA and/or kV according to patient size and/or use of iterative reconstruction technique. CONTRAST:  OMNIPAQUE IOHEXOL 300 MG/ML  SOLN COMPARISON:  CT abdomen and pelvis dated 09/27/2022, CT chest dated 04/06/2022 FINDINGS: CTA CHEST FINDINGS Cardiovascular: Left chest wall port terminates in the right atrium. The study is high quality for the evaluation of pulmonary embolism. There are no filling defects in the central, lobar, segmental or subsegmental pulmonary artery branches to suggest acute pulmonary embolism. Anatomic variant common origin of the brachiocephalic and common carotid arteries. Normal heart size. No significant pericardial fluid/thickening. Mediastinum/Nodes: Imaged thyroid gland without nodules meeting criteria for imaging follow-up by size. Normal esophagus. No pathologically enlarged axillary, supraclavicular, mediastinal, or hilar lymph nodes. Lungs/Pleura: The central airways are patent. Unchanged subpleural ground-glass densities along the anterior right upper and middle lobe. Unchanged small area of perifissural ground-glass opacity is also seen in the anterior right lower lobe. Unchanged 3 mm basilar left lower lobe nodule (6:88). No pneumothorax. No pleural effusion. Musculoskeletal: No acute or abnormal lytic or blastic osseous lesions. Right mastectomy. Left  breast implant. Review of the MIP images confirms the above findings. CT ABDOMEN and PELVIS FINDINGS Hepatobiliary: Subcentimeter hypoattenuating focus in segment 5 (2:23) too small to characterize but unchanged. No intra or extrahepatic biliary ductal dilation. Normal gallbladder. Pancreas: No focal lesions or main ductal dilation. Spleen: Normal in size without focal abnormality. Adrenals/Urinary Tract: No adrenal nodules. No suspicious renal mass, calculi, or hydronephrosis. No focal bladder wall thickening. Stomach/Bowel: Normal appearance of the stomach. Status post subtotal colectomy with ileosigmoid anastomosis. Marked long segment small bowel dilation demonstrating mild mural thickening, similar to 09/27/2022, with abrupt caliber transition near the ileosigmoid anastomosis (5:51). Vascular/Lymphatic: No significant vascular findings are present. No enlarged abdominal or pelvic lymph nodes. Reproductive: Enlarged and lobulated uterus containing multifocal enhancing masses, including a few with coarse calcifications. No adnexal masses. Other: No free fluid, fluid collection, or free air. Musculoskeletal: No acute or abnormal lytic or blastic osseous lesions. IMPRESSION: 1. No evidence of pulmonary embolism. 2. Status post subtotal colectomy with marked long segment small bowel dilation demonstrating mild mural thickening, similar to 09/27/2022, with abrupt caliber transition near the ileosigmoid anastomosis, suspicious for small-bowel obstruction. 3. Unchanged subpleural ground-glass densities along the anterior right upper and middle lobe, likely postradiation change. 4. 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 5. Unchanged 3 mm basilar left lower lobe nodule. Electronically Signed   By: Agustin Cree M.D.   On: 11/18/2022 18:33   CT Angio Chest PE W/Cm &/Or Wo Cm  Result Date: 11/18/2022 CLINICAL DATA:   History of breast cancer on chemotherapy with loss of appetite. * Tracking Code: BO * EXAM: CT ANGIOGRAPHY CHEST CT ABDOMEN AND PELVIS WITH CONTRAST TECHNIQUE: Multidetector CT imaging of the chest was performed using the standard protocol during bolus administration of intravenous contrast. Multiplanar CT image reconstructions and MIPs were obtained to evaluate the vascular anatomy. Multidetector CT imaging of the abdomen and pelvis was performed using the standard protocol during bolus administration of intravenous contrast. RADIATION DOSE REDUCTION: This exam was performed according to the departmental dose-optimization program which includes automated exposure control, adjustment of the mA and/or kV according to patient size and/or use of iterative reconstruction technique. CONTRAST:  OMNIPAQUE  IOHEXOL 300 MG/ML  SOLN COMPARISON:  CT abdomen and pelvis dated 09/27/2022, CT chest dated 04/06/2022 FINDINGS: CTA CHEST FINDINGS Cardiovascular: Left chest wall port terminates in the right atrium. The study is high quality for the evaluation of pulmonary embolism. There are no filling defects in the central, lobar, segmental or subsegmental pulmonary artery branches to suggest acute pulmonary embolism. Anatomic variant common origin of the brachiocephalic and common carotid arteries. Normal heart size. No significant pericardial fluid/thickening. Mediastinum/Nodes: Imaged thyroid gland without nodules meeting criteria for imaging follow-up by size. Normal esophagus. No pathologically enlarged axillary, supraclavicular, mediastinal, or hilar lymph nodes. Lungs/Pleura: The central airways are patent. Unchanged subpleural ground-glass densities along the anterior right upper and middle lobe. Unchanged small area of perifissural ground-glass opacity is also seen in the anterior right lower lobe. Unchanged 3 mm basilar left lower lobe nodule (6:88). No pneumothorax. No pleural effusion. Musculoskeletal: No acute or  abnormal lytic or blastic osseous lesions. Right mastectomy. Left breast implant. Review of the MIP images confirms the above findings. CT ABDOMEN and PELVIS FINDINGS Hepatobiliary: Subcentimeter hypoattenuating focus in segment 5 (2:23) too small to characterize but unchanged. No intra or extrahepatic biliary ductal dilation. Normal gallbladder. Pancreas: No focal lesions or main ductal dilation. Spleen: Normal in size without focal abnormality. Adrenals/Urinary Tract: No adrenal nodules. No suspicious renal mass, calculi, or hydronephrosis. No focal bladder wall thickening. Stomach/Bowel: Normal appearance of the stomach. Status post subtotal colectomy with ileosigmoid anastomosis. Marked long segment small bowel dilation demonstrating mild mural thickening, similar to 09/27/2022, with abrupt caliber transition near the ileosigmoid anastomosis (5:51). Vascular/Lymphatic: No significant vascular findings are present. No enlarged abdominal or pelvic lymph nodes. Reproductive: Enlarged and lobulated uterus containing multifocal enhancing masses, including a few with coarse calcifications. No adnexal masses. Other: No free fluid, fluid collection, or free air. Musculoskeletal: No acute or abnormal lytic or blastic osseous lesions. IMPRESSION: 1. No evidence of pulmonary embolism. 2. Status post subtotal colectomy with marked long segment small bowel dilation demonstrating mild mural thickening, similar to 09/27/2022, with abrupt caliber transition near the ileosigmoid anastomosis, suspicious for small-bowel obstruction. 3. Unchanged subpleural ground-glass densities along the anterior right upper and middle lobe, likely postradiation change. 4. 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 5. Unchanged 3 mm basilar left lower lobe nodule. Electronically Signed   By: Agustin Cree M.D.   On: 11/18/2022 18:33     Anti-infectives: Anti-infectives (From admission, onward)    None        Assessment/Plan  Hx of Crohn's s/p subtotal colectomy w/ ileosigmoid anastomosis in 2021 Chronically dilated loop of small bowel proximal to ileosigmoid anastomosis  - pt not clinically obstructed, tolerating liquids and having bowel function - GI following also and planning EGD/flex-sig tomorrow  - not really any apparent surgical issues at this point, further management per GI and general surgery will follow peripherally at this time  FEN: FLD, IVF per [facility]  VTE: SCDs ID: none   LOS: 2 days   I reviewed Consultant GI notes, hospitalist notes, last 24 h vitals and pain scores, last 48 h intake and output, last 24 h labs and trends, and last 24 h imaging results.    Juliet Rude, [facility] 11/20/2022, 11:44 AM Please see Amion for pager number during day hours 7:00am-4:30pm

## 2022-11-20 NOTE — Anesthesia Preprocedure Evaluation (Signed)
Anesthesia Evaluation  Patient identified by MRN, date of birth, ID band Patient awake    Reviewed: Allergy & Precautions, NPO status , Patient's Chart, lab work & pertinent test results  Airway Mallampati: I  TM Distance: >3 FB Neck ROM: Full    Dental no notable dental hx. (+) Teeth Intact, Dental Advisory Given   Pulmonary former smoker   Pulmonary exam normal breath sounds clear to auscultation       Cardiovascular Normal cardiovascular exam Rhythm:Regular Rate:Normal     Neuro/Psych negative neurological ROS  negative psych ROS   GI/Hepatic negative GI ROS, Neg liver ROS,,,Crohns dz   Endo/Other    Renal/GU negative Renal ROSLab Results      Component                Value               Date                      NA                       131 (L)             11/20/2022                CL                       98                  11/20/2022                K                        3.6                 11/20/2022                CO2                      24                  11/20/2022                BUN                      7                   11/20/2022                CREATININE               0.66                11/20/2022                GFRNONAA                 >60                 11/20/2022                CALCIUM                  7.6 (L)             11/20/2022  PHOS                     4.1                 05/28/2021                ALBUMIN                  2.9 (L)             11/18/2022                GLUCOSE                  82                  11/20/2022                Musculoskeletal   Abdominal   Peds  Hematology  (+) Blood dyscrasia, anemia Lab Results      Component                Value               Date                      WBC                      7.2                 11/20/2022                HGB                      8.9 (L)             11/20/2022                HCT                      28.1  (L)            11/20/2022                MCV                      79.2 (L)            11/20/2022                PLT                      361                 11/20/2022              Anesthesia Other Findings Breast ca  All: NSAIDS  Reproductive/Obstetrics                              Anesthesia Physical Anesthesia Plan  ASA: 2  Anesthesia Plan: MAC   Post-op Pain Management: Minimal or no pain anticipated   Induction: Intravenous  PONV Risk Score and Plan: Treatment may vary due to age or medical condition  Airway Management Planned: Natural Airway and Nasal Cannula  Additional Equipment: None  Intra-op Plan:   Post-operative Plan:   Informed Consent: I have reviewed the patients History  and Physical, chart, labs and discussed the procedure including the risks, benefits and alternatives for the proposed anesthesia with the patient or authorized representative who has indicated his/her understanding and acceptance.     Dental advisory given  Plan Discussed with: CRNA and Anesthesiologist  Anesthesia Plan Comments: (Recta bleeding for flex sig)         Anesthesia Quick Evaluation

## 2022-11-21 ENCOUNTER — Inpatient Hospital Stay (HOSPITAL_COMMUNITY): Payer: Managed Care, Other (non HMO) | Admitting: Anesthesiology

## 2022-11-21 ENCOUNTER — Encounter (HOSPITAL_COMMUNITY)
Admission: EM | Disposition: A | Discharge status: Home / Self Care | Payer: Self-pay | Source: Home / Self Care | Attending: Internal Medicine

## 2022-11-21 ENCOUNTER — Encounter (HOSPITAL_COMMUNITY): Payer: Self-pay | Admitting: Internal Medicine

## 2022-11-21 DIAGNOSIS — K641 Second degree hemorrhoids: Secondary | ICD-10-CM | POA: Diagnosis not present

## 2022-11-21 DIAGNOSIS — K644 Residual hemorrhoidal skin tags: Secondary | ICD-10-CM

## 2022-11-21 DIAGNOSIS — K3189 Other diseases of stomach and duodenum: Secondary | ICD-10-CM

## 2022-11-21 DIAGNOSIS — K629 Disease of anus and rectum, unspecified: Secondary | ICD-10-CM | POA: Diagnosis not present

## 2022-11-21 DIAGNOSIS — K2289 Other specified disease of esophagus: Secondary | ICD-10-CM | POA: Diagnosis not present

## 2022-11-21 DIAGNOSIS — K6289 Other specified diseases of anus and rectum: Secondary | ICD-10-CM

## 2022-11-21 DIAGNOSIS — R627 Adult failure to thrive: Secondary | ICD-10-CM | POA: Diagnosis not present

## 2022-11-21 DIAGNOSIS — Z980 Intestinal bypass and anastomosis status: Secondary | ICD-10-CM

## 2022-11-21 HISTORY — PX: ESOPHAGOGASTRODUODENOSCOPY (EGD) WITH PROPOFOL: SHX5813

## 2022-11-21 HISTORY — PX: FLEXIBLE SIGMOIDOSCOPY: SHX5431

## 2022-11-21 HISTORY — PX: BIOPSY: SHX5522

## 2022-11-21 LAB — GLUCOSE, CAPILLARY
Glucose-Capillary: 83 mg/dL (ref 70–99)
Glucose-Capillary: 139 mg/dL — ABNORMAL HIGH (ref 70–99)

## 2022-11-21 LAB — C-REACTIVE PROTEIN: CRP: 24.6 mg/dL — ABNORMAL HIGH (ref <1.0)

## 2022-11-21 SURGERY — SIGMOIDOSCOPY, FLEXIBLE
Anesthesia: Monitor Anesthesia Care

## 2022-11-21 MED ORDER — DEXTROSE IN LACTATED RINGERS 5 % IV SOLN: INTRAVENOUS | Status: DC

## 2022-11-21 MED ORDER — SODIUM CHLORIDE 0.9 % IV SOLN: INTRAVENOUS | Status: DC

## 2022-11-21 MED ORDER — PROPOFOL 500 MG/50ML IV EMUL
INTRAVENOUS | Status: DC | PRN
  Administered 2022-11-21: 150 ug/kg/min via INTRAVENOUS

## 2022-11-21 MED ORDER — SIMETHICONE 80 MG PO CHEW
80.0000 mg | CHEWABLE_TABLET | Freq: Two times a day (BID) | ORAL | Status: DC
  Administered 2022-11-22: 80 mg via ORAL
  Filled 2022-11-21 (×4): qty 1

## 2022-11-21 MED ORDER — LACTATED RINGERS IV SOLN: INTRAVENOUS | Status: DC | PRN

## 2022-11-21 MED ORDER — LACTATED RINGERS IV SOLN
Freq: Once | INTRAVENOUS | Status: AC
  Administered 2022-11-21: 1000 mL via INTRAVENOUS

## 2022-11-21 MED ORDER — HYOSCYAMINE SULFATE 0.125 MG SL SUBL: 0.1250 mg | SUBLINGUAL_TABLET | Freq: Four times a day (QID) | SUBLINGUAL | Status: DC | PRN

## 2022-11-21 MED ORDER — METHYLPREDNISOLONE SODIUM SUCC 40 MG IJ SOLR
20.0000 mg | Freq: Two times a day (BID) | INTRAMUSCULAR | Status: DC
  Administered 2022-11-21 – 2022-11-22 (×3): 20 mg via INTRAVENOUS
  Filled 2022-11-21 (×3): qty 1

## 2022-11-21 MED ORDER — PROPOFOL 10 MG/ML IV BOLUS
INTRAVENOUS | Status: DC | PRN
Start: 2022-11-21 — End: 2022-11-21
  Administered 2022-11-21 (×2): 30 mg via INTRAVENOUS
  Administered 2022-11-21: 40 mg via INTRAVENOUS

## 2022-11-21 MED ORDER — HYOSCYAMINE SULFATE 0.125 MG PO TBDP: 0.1250 mg | ORAL_TABLET | Freq: Four times a day (QID) | ORAL | Status: DC | PRN

## 2022-11-21 MED ORDER — LIDOCAINE 2% (20 MG/ML) 5 ML SYRINGE
INTRAMUSCULAR | Status: DC | PRN
  Administered 2022-11-21: 60 mg via INTRAVENOUS
  Administered 2022-11-21: 40 mg via INTRAVENOUS

## 2022-11-21 SURGICAL SUPPLY — 15 items

## 2022-11-21 NOTE — Progress Notes (Signed)
Initial Nutrition Assessment  DOCUMENTATION CODES:   Underweight  INTERVENTION:   Once diet advanced: -[employer] 1.4 PO BID, each provides 455 kcals and 20g protein -Multivitamin with minerals daily  NUTRITION DIAGNOSIS:   Increased nutrient needs related to acute illness as evidenced by estimated needs.  GOAL:   Patient will meet greater than or equal to 90% of their needs  MONITOR:   Labs, Weight trends, I & O's  REASON FOR ASSESSMENT:   Malnutrition Screening Tool    ASSESSMENT:   47 year old with past medical history significant for recent small bowel obstruction 6//20 04/2022, breast cancer in remission, Crohn's disease status post subtotal colectomy 2021, grade 1 diastolic dysfunction, presents complaining of poor oral intake for the last 2 or 3 weeks.  Completed chemotherapy 6 weeks ago.  Reported bowel movement which are irregular and the stools are pebble-like.  Reported abdominal pain.  CT abdomen and pelvis showed status post subtotal colectomy with MAC long segment small bowel dilation demonstrating mild-moderate thickening, similar to 09/27/2022 with abrupt caliber transition near the ileal sigmoid anastomosis, suspicious for small bowel obstruction.  Patient unavailable, having EGD and flex sigmoidoscopy today.  Per chart review, pt admitted 8/10. Has NPO/CLD/FLD over the past few days. Pt having BMs. Pt has had poor PO for 2-3 weeks. Will gather history at follow-up. Will monitor for diet advancement as pt likely would benefit from protein supplementation.   Per weight records, pt has lost 25 lbs since 3/22 (18% wt loss x 4.5 months, significant for time frame).  Medications: Lactated ringers  Labs reviewed: Low Na  Low iron  NUTRITION - FOCUSED PHYSICAL EXAM:  In endoscopy  Diet Order:   Diet Order             Diet NPO time specified  Diet effective now                   EDUCATION NEEDS:   Not appropriate for education at this  time  Skin:  Skin Assessment: Reviewed RN Assessment  Last BM:  8/11  Height:   Ht Readings from Last 1 Encounters:  11/18/22 5\' 4"  (1.626 m)    Weight:   Wt Readings from Last 1 Encounters:  11/21/22 49.1 kg    BMI:  Body mass index is 18.58 kg/m.  Estimated Nutritional Needs:   Kcal:  1650-1850  Protein:  85-95g  Fluid:  1.8L/day   Tilda Franco, MS, RD, LDN Inpatient Clinical Dietitian Contact information available via Amion

## 2022-11-21 NOTE — Transfer of Care (Signed)
Immediate Anesthesia Transfer of Care Note  Patient: [name]  Procedure(s) Performed: FLEXIBLE SIGMOIDOSCOPY ESOPHAGOGASTRODUODENOSCOPY (EGD) WITH PROPOFOL BIOPSY  Patient Location: Endoscopy Unit  Anesthesia Type:MAC  Level of Consciousness: awake, oriented, and patient cooperative  Airway & Oxygen Therapy: Patient Spontanous Breathing and Patient connected to face mask oxygen  Post-op Assessment: Report given to RN and Post -op Vital signs reviewed and stable  Post vital signs: Reviewed  Last Vitals:  Vitals Value Taken Time  BP    Temp    Pulse    Resp    SpO2      Last Pain:  Vitals:   11/21/22 1144  TempSrc: Temporal  PainSc: 0-No pain      Patients Stated Pain Goal: 2 (11/21/22 0159)  Complications: No notable events documented.

## 2022-11-21 NOTE — Op Note (Signed)
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 Providers:                Corliss Parish, MD, Martha Clan, RN, Harrington Challenger, Technician Referring MD:             Inpatient medical service Medicines:                Monitored Anesthesia Care Complications:            No immediate complications. Estimated Blood Loss:     Estimated blood loss was minimal. Procedure:                Pre-Anesthesia Assessment:                           - Prior to the procedure, a History and Physical                            was performed, and patient medications and                            allergies were reviewed. The patient's tolerance of                            previous anesthesia was also reviewed. The risks                            and benefits of the procedure and the sedation                            options and risks were discussed with the patient.                            All questions were answered, and informed consent                            was obtained. Prior Anticoagulants: The patient has  taken no anticoagulant or antiplatelet agents. ASA                            Grade Assessment: II - A patient with mild systemic                            disease. After reviewing the risks and benefits,                            the patient was deemed in satisfactory condition to                            undergo  the procedure.                           After obtaining informed consent, the scope was                            passed under direct vision. The [id number]-[id number]                            ([id number]) Olympus therapeutic endoscope was                            introduced through the anus and advanced to the the                            rectosigmoid junction. The [id number]-[id number] ([id number])                            Olympus slim endoscope was introduced through the                            anus and advanced to the the ileo-sigmoid                            anastomosis. The flexible sigmoidoscopy was                            accomplished without difficulty. The patient                            tolerated the procedure well. Scope In: 1:18:24 PM Scope Out: 1:55:25 PM Total Procedure Duration: 0 hours 37 minutes 1 second  Findings:      Skin tags were found on perianal exam.      The digital rectal exam findings include hemorrhoids. Pertinent       negatives include no palpable rectal lesions.      Non-bleeding non-thrombosed internal hemorrhoids were found during       retroflexion, during perianal exam and during digital exam. The       hemorrhoids were Grade II (internal hemorrhoids that prolapse but reduce       spontaneously).      Segmental areas of granular mucosa were found in the remaining       rectum/rectosigmoid.  Biopsies were taken with a cold forceps for       histology.      There was evidence of a prior end-to-side ileo-colonic anastomosis in       the recto-sigmoid colon (approximately at 18 cm). This was patent and       was characterized by congestion, erythema, friable mucosa, inflammation,       severe stenosis and stenosis 5 mm (inner diameter) x 4 cm (length). The       anastomosis was traversed after transitioning to the neonatal endoscope.      The lumen of the neoterminal ileum was grossly dilated. Suction via       Endoscope was performed which did look to  decompress patient's abdomen.      Segmental mild mucosal changes characterized by congestion (edema),       erosions and friability were found in the neoterminal ileum. Biopsies       were taken with a cold forceps for histology. Impression:               - Perianal skin tags found on perianal exam.                            Hemorrhoids found on digital rectal exam.                           - Non-bleeding non-thrombosed internal hemorrhoids.                           - Granularity in the remaining rectum/rectosigmoid.                            Biopsied.                           - Patent end-to-side ileo-colonic anastomosis,                            characterized by stenosis, congestion, erythema,                            friable mucosa, inflammation and severe stenosis.                            This was approximately 5 mm in diameter and 4 cm in                            length and only traversed with the neonatal                            endoscope. There is no ability to perform stenting.                           - Dilated neoterminal ileum. Suctioned via                            endoscope.                           -  Segmental mild mucosal changes were found.                            Biopsied. Moderate Sedation:      Not Applicable - Patient had care per Anesthesia. Recommendation:           - The patient will be observed post-procedure,                            until all discharge criteria are met.                           - Return patient to hospital ward for ongoing care.                           - Advance diet as tolerated.                           - Await pathology results.                           - Will discuss with primary Inpatient GI team to                            consider steroids if necessary in effort of seeing                            if this can be salvaged without potential need of                            restarting biologic therapy or  surgery.                           - Expect that she may have some discomfort                            post-procedure with the CO2 that is trapped above                            (I went back in prior to completion to remove as                            much as possible).                           - I have ordered Levsin and Simethicone for                            hospital floor use.                           - If progressive pain/discomfort then consider                            imaging.                           -  The findings and recommendations were discussed                            with the patient.                           - The findings and recommendations were discussed                            with the referring physician. Procedure Code(s):        --- Professional ---                           [phone], Sigmoidoscopy, flexible; with biopsy, single                            or multiple Diagnosis Code(s):        --- Professional ---                           K62.89, Other specified diseases of anus and rectum                           Z98.0, Intestinal bypass and anastomosis status                           K59.39, Other megacolon                           K63.89, Other specified diseases of intestine                           K64.4, Residual hemorrhoidal skin tags                           K64.1, Second degree hemorrhoids                           R10.84, Generalized abdominal pain                           Z87.19, Personal history of other diseases of the                            digestive system                           R63.4, Abnormal weight loss                           R93.3, Abnormal findings on diagnostic imaging of                            other parts of digestive tract CPT copyright 2022 [employer]. All rights reserved. The codes documented in this report are preliminary and upon coder review may  be revised to meet current  compliance requirements. Corliss Parish, MD 11/21/2022 2:07:44 PM Number of  Addenda: 0

## 2022-11-21 NOTE — Anesthesia Procedure Notes (Signed)
Procedure Name: MAC Date/Time: 11/21/2022 1:06 PM  Performed by: Lovie Chol, CRNAPre-anesthesia Checklist: Patient identified, Emergency Drugs available, Suction available and Patient being monitored Patient Re-evaluated:Patient Re-evaluated prior to induction Oxygen Delivery Method: Simple face mask

## 2022-11-21 NOTE — Progress Notes (Addendum)
PROGRESS NOTE    [name]  BJY:[id number] DOB: 03-Jan-1976 DOA: 11/18/2022 PCP: Ollen Bowl, MD   Brief Narrative: 47 year old with past medical history significant for recent small bowel obstruction 6//20 04/2022, breast cancer in remission, Crohn's disease status post subtotal colectomy 2021, grade 1 diastolic dysfunction, presents complaining of poor oral intake for the last 2 or 3 weeks.  Completed chemotherapy 6 weeks ago.  Reported bowel movement which are irregular and the stools are pebble-like.  Reported abdominal pain.  CT abdomen and pelvis showed status post subtotal colectomy with MAC long segment small bowel dilation demonstrating mild-moderate thickening, similar to 09/27/2022 with abrupt caliber transition near the ileal sigmoid anastomosis, suspicious for small bowel obstruction.  GI and surgery consulted and following.  Patient underwent upper GI endoscopy; show no gross lesion in the entire esophagus, erythematous mucosa in the stomach, no gross lesion in the duodenal bulb.  Flexible sigmoidoscopy: nonbleeding nonthrombosed internal hemorrhoids, granularity in the remaining of the rectum and rectosigmoid.  Patent end to side ileocolonic anastomosis, characterized by a stenosis, congestion, erythema, friable mucosa and inflammatory and severe stenosis.  There was no ability to perform stenting.  Dilated neoterminal ileum.   Assessment & Plan:   Principal Problem:   Failure to thrive in adult Active Problems:   History of abdominal subtotal colectomy   History of Crohn's disease   Chronic disease anemia   Crohn's disease of small and large intestines with complication (HCC)   Generalized abdominal pain   Hypokalemia   Protein-calorie malnutrition, moderate (HCC)   Anorexia   Nausea   Abnormal CT scan, gastrointestinal tract   Colonic stricture (HCC)   1-Small bowel obstruction History of Crohn's disease Status post subtotal colectomy 2021 -Patient presented  with abdominal pain, poor oral intake, constipation.  CT scanning with concern for SBO. -CT abdomen: Status post subtotal colectomy with marked long segment small bowel dilation demonstrating mild mural thickening, similar to 09/27/2022, with abrupt caliber transition near the ileosigmoid anastomosis, suspicious for small-bowel obstruction. -General Surgery consulted -GI consulted -Continue with IV fluids -Underwent sigmoidoscopy 8/13: nonbleeding nonthrombosed internal hemorrhoids, granularity in the remaining of the rectum and rectosigmoid.  Patent end to side ileocolonic anastomosis, characterized by a stenosis, congestion, erythema, friable mucosa and inflammatory and severe stenosis.  There was no ability to perform stenting.  Dilated neoterminal ileum. -Decline PPI prophylaxis.  -Awaiting GI recommendation in regards steroid.  -change IV fluids to D 5 LR, to prevent hypoglycemia.   Hypokalemia: -Replaced.   Hypomagnesemia: -Replaced.   Chronic constipation: -Defer Bowel regimen to GI  Breast cancer; Under the care of of Dr. Pamelia Hoit.  Last chemotherapy 6 weeks ago  Iron deficiency anemia; start iron supplement when tolerates diet   Grade 1 diastolic dysfunction -Monitor volume   Sinus tachycardia: -In setting hypovolemia, dehydration.  -Continue with IV fluids.  -CTA chest negative for PE -TSH normal.   Oral thrush:  Started Nystatin.   Hypoalbuminemia; Will need protein supplement when able to tolerate oral.  Nutrition consult.     Estimated body mass index is 18.58 kg/m as calculated from the following:   Height as of this encounter: 5\' 4"  (1.626 m).   Weight as of this encounter: 49.1 kg.   DVT prophylaxis: decline heparin.  Code Status: Full code Family Communication: Care discussed with patient.  Disposition Plan:  Status is: Inpatient Remains inpatient appropriate because: management of SBP, dehydration.     Consultants:  General  Sx GI  Procedures:  Antimicrobials:    Subjective: Decline PPI/  Had BM  Objective: Vitals:   11/21/22 0500 11/21/22 0648 11/21/22 1144 11/21/22 1146  BP:  (!) 117/90  (!) 128/97  Pulse:  (!) 107    Resp:  14 19   Temp:  97.7 F (36.5 C) 97.7 F (36.5 C)   TempSrc:  Oral Temporal   SpO2:  98% 99%   Weight: 49.1 kg     Height:        Intake/Output Summary (Last 24 hours) at 11/21/2022 1410 Last data filed at 11/21/2022 1000 Gross per 24 hour  Intake 1250.27 ml  Output 2400 ml  Net -1149.73 ml   Filed Weights   11/18/22 1551 11/20/22 0500 11/21/22 0500  Weight: 49.9 kg 48.7 kg 49.1 kg    Examination:  General exam: NAD Respiratory system: CTA Cardiovascular system: S 1, S 2 RRR Gastrointestinal system: BS present.  Extremities: no edema   Data Reviewed: I have personally reviewed following labs and imaging studies  CBC: Recent Labs  Lab 11/18/22 1707 11/19/22 0743 11/20/22 0252 11/21/22 0308  WBC 8.8 7.7 7.2 7.3  NEUTROABS 6.0  --   --   --   HGB 10.1* 9.2* 8.9* 9.2*  HCT 30.6* 28.9* 28.1* 29.1*  MCV 76.7* 78.7* 79.2* 78.6*  PLT 391 364 361 386   Basic Metabolic Panel: Recent Labs  Lab 11/18/22 1707 11/19/22 0044 11/19/22 0743 11/20/22 0252 11/21/22 0308  NA 137  --  132* 131* 131*  K 3.2*  --  3.7 3.6 3.7  CL 101  --  101 98 96*  CO2 25  --  20* 24 23  GLUCOSE 88  --  78 82 65*  BUN 10  --  7 7 9   CREATININE 0.61  --  0.66 0.66 0.69  CALCIUM 8.4*  --  8.0* 7.6* 7.8*  MG  --  1.8  --   --   --    GFR: Estimated Creatinine Clearance: 69.6 mL/min (by C-G formula based on SCr of 0.69 mg/dL). Liver Function Tests: Recent Labs  Lab 11/18/22 1707  AST 9*  ALT 6  ALKPHOS 106  BILITOT 0.6  PROT 5.9*  ALBUMIN 2.9*   Recent Labs  Lab 11/18/22 1707  LIPASE 11   No results for input(s): "AMMONIA" in the last 168 hours. Coagulation Profile: Recent Labs  Lab 11/20/22 0252  INR 1.1   Cardiac Enzymes: No results for input(s):  "CKTOTAL", "CKMB", "CKMBINDEX", "TROPONINI" in the last 168 hours. BNP (last 3 results) No results for input(s): "PROBNP" in the last 8760 hours. HbA1C: No results for input(s): "HGBA1C" in the last 72 hours. CBG: No results for input(s): "GLUCAP" in the last 168 hours. Lipid Profile: No results for input(s): "CHOL", "HDL", "LDLCALC", "TRIG", "CHOLHDL", "LDLDIRECT" in the last 72 hours. Thyroid Function Tests: Recent Labs    11/21/22 0308  TSH 1.036   Anemia Panel: Recent Labs    11/20/22 0252  VITAMINB12 1,244*  FOLATE 16.2  FERRITIN 160  TIBC 118*  IRON 22*  RETICCTPCT 0.9   Sepsis Labs: Recent Labs  Lab 11/18/22 1707  LATICACIDVEN 0.6    Recent Results (from the past 240 hour(s))  Culture, blood (routine x 2)     Status: None (Preliminary result)   Collection Time: 11/18/22 11:58 PM   Specimen: BLOOD LEFT ARM  Result Value Ref Range Status   Specimen Description   Final    BLOOD LEFT ARM Performed at [facility], [address] [place], [place] [address]    Special Requests   Final    BOTTLES DRAWN AEROBIC AND ANAEROBIC Blood Culture adequate volume Performed at [facility], [address], [place], [place] [address]    Culture   Final    NO GROWTH 2 DAYS Performed at [facility], [address], [place], [place] [address]    Report Status PENDING  Incomplete  Culture, blood (routine x 2)     Status: None (Preliminary result)   Collection Time: 11/18/22 11:58 PM   Specimen: BLOOD LEFT ARM  Result Value Ref Range Status   Specimen Description   Final    BLOOD LEFT ARM Performed at [facility], [address], [place], [place] [address]    Special Requests   Final    BOTTLES DRAWN AEROBIC AND ANAEROBIC Blood Culture adequate volume Performed at [facility], [address] [place], [place] [address]    Culture   Final    NO GROWTH 2 DAYS Performed at [facility], [address], [place], [place] [address]    Report Status PENDING  Incomplete         Radiology Studies: No results found.      Scheduled Meds:  [MAR Hold] Chlorhexidine Gluconate Cloth  6 each Topical Daily   [MAR Hold] nystatin  5 mL Oral QID   [MAR Hold] pantoprazole (PROTONIX) IV  40 mg Intravenous Daily   [MAR Hold] sodium chloride flush  3 mL Intravenous Q12H   Continuous Infusions:  [MAR Hold] sodium chloride     sodium chloride     lactated ringers 1,000 mL with potassium chloride 10 mEq infusion 100 mL/hr at 11/20/22 2101     LOS: 3 days    Time spent: 35 minutes     A , MD [facility]   If 7PM-7AM, please contact night-coverage [email or web address]  11/21/2022, 2:10 PM

## 2022-11-21 NOTE — Anesthesia Postprocedure Evaluation (Signed)
Anesthesia Post Note  Patient: [name]  Procedure(s) Performed: FLEXIBLE SIGMOIDOSCOPY ESOPHAGOGASTRODUODENOSCOPY (EGD) WITH PROPOFOL BIOPSY     Patient location during evaluation: Endoscopy Anesthesia Type: MAC Level of consciousness: awake and alert Pain management: pain level controlled Vital Signs Assessment: post-procedure vital signs reviewed and stable Respiratory status: spontaneous breathing, nonlabored ventilation, respiratory function stable and patient connected to nasal cannula oxygen Cardiovascular status: blood pressure returned to baseline and stable Postop Assessment: no apparent nausea or vomiting Anesthetic complications: no   No notable events documented.  Last Vitals:  Vitals:   11/21/22 1458 11/21/22 1606  BP: (!) 119/90 119/86  Pulse: 94 (!) 104  Resp: 16 16  Temp: 36.5 C 36.7 C  SpO2: 100% 100%    Last Pain:  Vitals:   11/21/22 1606  TempSrc: Oral  PainSc:                  Trevor Iha

## 2022-11-21 NOTE — Op Note (Signed)
[facility] Patient Name: [name] Procedure Date: 11/21/2022 MRN: [id number] Attending MD: Corliss Parish , MD, [id number] Date of Birth: 1975/07/30 CSN: [id number] Age: 47 Admit Type: Inpatient Procedure:                Upper GI endoscopy Indications:              Generalized abdominal pain, Crohn's disease rule                            out, Anorexia, Nausea, Weight loss Providers:                Corliss Parish, MD, Martha Clan, RN, Harrington Challenger, Technician Referring MD:             Inpatient Medical Service Medicines:                Monitored Anesthesia Care Complications:            No immediate complications. Estimated Blood Loss:     Estimated blood loss was minimal. Procedure:                Pre-Anesthesia Assessment:                           - Prior to the procedure, a History and Physical                            was performed, and patient medications and                            allergies were reviewed. The patient's tolerance of                            previous anesthesia was also reviewed. The risks                            and benefits of the procedure and the sedation                            options and risks were discussed with the patient.                            All questions were answered, and informed consent                            was obtained. Prior Anticoagulants: The patient has                            taken no anticoagulant or antiplatelet agents. ASA                            Grade Assessment: II - A patient with mild systemic  disease. After reviewing the risks and benefits,                            the patient was deemed in satisfactory condition to                            undergo the procedure.                           After obtaining informed consent, the endoscope was                            passed under direct vision. Throughout the                             procedure, the patient's blood pressure, pulse, and                            oxygen saturations were monitored continuously. The                            [id number]-[id number] ([id number]) Olympus therapeutic endoscope                            was introduced through the mouth, and advanced to                            the third part of duodenum. The upper GI endoscopy                            was accomplished without difficulty. The patient                            tolerated the procedure. Scope In: Scope Out: Findings:      No gross lesions were noted in the entire esophagus.      The Z-line was irregular and was found 34 cm from the incisors.      Patchy mildly erythematous mucosa without bleeding was found in the       entire examined stomach. Biopsies were taken with a cold forceps for       histology and Helicobacter pylori testing.      No gross lesions were noted in the duodenal bulb, in the first portion       of the duodenum, in the second portion of the duodenum, in the major       papilla and in the third portion of the duodenum. Biopsies were taken       with a cold forceps for histology. Impression:               - No gross lesions in the entire esophagus. Z-line                            irregular, 34 cm from the incisors.                           -  Erythematous mucosa in the stomach. Biopsied.                           - No gross lesions in the duodenal bulb, in the                            first portion of the duodenum, in the second                            portion of the duodenum, in the major papilla and                            in the third portion of the duodenum. Biopsied. Moderate Sedation:      Not Applicable - Patient had care per Anesthesia. Recommendation:           - Proceed to scheduled Flexible Sigmoidoscopy.                           - Continue present medications.                           - Await pathology results.                            - The findings and recommendations were discussed                            with the patient.                           - The findings and recommendations were discussed                            with the referring physician. Procedure Code(s):        --- Professional ---                           [phone], Esophagogastroduodenoscopy, flexible,                            transoral; with biopsy, single or multiple Diagnosis Code(s):        --- Professional ---                           K22.89, Other specified disease of esophagus                           K31.89, Other diseases of stomach and duodenum                           R10.84, Generalized abdominal pain                           R63.0, Anorexia  R11.0, Nausea                           R63.4, Abnormal weight loss CPT copyright 2022 [employer]. All rights reserved. The codes documented in this report are preliminary and upon coder review may  be revised to meet current compliance requirements. Corliss Parish, MD 11/21/2022 1:16:00 PM Number of Addenda: 0

## 2022-11-21 NOTE — Progress Notes (Signed)
[facility] Progress Note  CC:   Crohn's disease, small bowel obstruction   Subjective: No nausea  or vomiting.  No abdominal pain.  She passed a nonbloody loose bowel movements of stool this morning.  No chest pain or shortness of breath.   Objective:  Vital signs in last 24 hours: Temp:  [97.7 F (36.5 C)-99.6 F (37.6 C)] 97.7 F (36.5 C) (08/13 0648) Pulse Rate:  [107-130] 107 (08/13 0648) Resp:  [14-18] 14 (08/13 0648) BP: (117-131)/(87-96) 117/90 (08/13 0648) SpO2:  [98 %-100 %] 98 % (08/13 0648) Last BM Date : 11/19/22 General: Alert 47 year old female in no acute distress. Heart: Regular rate rhythm, no murmurs. Pulm: Breath sounds clear throughout. Abdomen: Soft, nondistended.  Nontender.  Positive bowel sounds to all 4 quadrants. Extremities:  Without edema. Neurologic:  Alert and  oriented x 4. Grossly normal neurologically. Psych:  Alert and cooperative. Normal mood and affect.  Intake/Output from previous day: 08/12 0701 - 08/13 0700 In: 1490.3 [P.O.:510; I.V.:980.3] Out: 2750 [Urine:2750] Intake/Output this shift: No intake/output data recorded.  Lab Results: Recent Labs    11/19/22 0743 11/20/22 0252 11/21/22 0308  WBC 7.7 7.2 7.3  HGB 9.2* 8.9* 9.2*  HCT 28.9* 28.1* 29.1*  PLT 364 361 386   BMET Recent Labs    11/19/22 0743 11/20/22 0252 11/21/22 0308  NA 132* 131* 131*  K 3.7 3.6 3.7  CL 101 98 96*  CO2 20* 24 23  GLUCOSE 78 82 65*  BUN 7 7 9   CREATININE 0.66 0.66 0.69  CALCIUM 8.0* 7.6* 7.8*   LFT Recent Labs    11/18/22 1707  PROT 5.9*  ALBUMIN 2.9*  AST 9*  ALT 6  ALKPHOS 106  BILITOT 0.6   PT/INR Recent Labs    11/20/22 0252  LABPROT 14.9  INR 1.1   Hepatitis Panel No results for input(s): "HEPBSAG", "HCVAB", "HEPAIGM", "HEPBIGM" in the last 72 hours.  ECHOCARDIOGRAM LIMITED  Result Date: 11/19/2022    ECHOCARDIOGRAM LIMITED REPORT   Patient Name:   CISSY SASSEEN Date of Exam: 11/19/2022  Medical Rec #:  [id number]        Height:       64.0 in Accession #:    [id number]       Weight:       110.0 lb Date of Birth:  06-Jun-1975        BSA:          1.517 m Patient Age:    46 years         BP:           112/82 mmHg Patient Gender: F                HR:           108 bpm. Exam Location:  Inpatient Procedure: Limited Echo, Color Doppler and Cardiac Doppler Indications:    Ventricular Tachycardia  History:        Patient has prior history of Echocardiogram examinations, most                 recent 09/18/2022. Arrythmias:Tachycardia.  Sonographer:    Milbert Coulter Referring Phys: [id number] SUBRINA SUNDIL  Sonographer Comments: Suboptimal apical window. Image acquisition challenging due to breast implants. IMPRESSIONS  1. Left ventricular ejection fraction, by estimation, is 60 to 65%. The left ventricle has normal function. The left ventricle has no regional wall motion abnormalities. Left ventricular diastolic parameters are indeterminate.  2. Right ventricular systolic function is normal. The right ventricular size is normal.  3. The mitral valve is normal in structure. No evidence of mitral valve regurgitation. No evidence of mitral stenosis.  4. The aortic valve is normal in structure. Aortic valve regurgitation is not visualized. No aortic stenosis is present.  5. The inferior vena cava is normal in size with greater than 50% respiratory variability, suggesting right atrial pressure of 3 mmHg. FINDINGS  Left Ventricle: Left ventricular ejection fraction, by estimation, is 60 to 65%. The left ventricle has normal function. The left ventricle has no regional wall motion abnormalities. The left ventricular internal cavity size was normal in size. There is  no left ventricular hypertrophy. Left ventricular diastolic parameters are indeterminate. Right Ventricle: The right ventricular size is normal. No increase in right ventricular wall thickness. Right ventricular systolic function is normal. Left Atrium: Left  atrial size was normal in size. Right Atrium: Right atrial size was normal in size. Pericardium: Trivial pericardial effusion is present. The pericardial effusion is circumferential. Mitral Valve: The mitral valve is normal in structure. No evidence of mitral valve stenosis. Tricuspid Valve: The tricuspid valve is normal in structure. Tricuspid valve regurgitation is not demonstrated. No evidence of tricuspid stenosis. Aortic Valve: The aortic valve is normal in structure. Aortic valve regurgitation is not visualized. No aortic stenosis is present. Pulmonic Valve: The pulmonic valve was normal in structure. Pulmonic valve regurgitation is not visualized. No evidence of pulmonic stenosis. Aorta: The aortic root is normal in size and structure. Venous: The inferior vena cava is normal in size with greater than 50% respiratory variability, suggesting right atrial pressure of 3 mmHg. IAS/Shunts: No atrial level shunt detected by color flow Doppler. Additional Comments: Spectral Doppler performed. Color Doppler performed.  LEFT VENTRICLE PLAX 2D LVIDd:         3.50 cm LVIDs:         2.20 cm LV PW:         0.90 cm LV IVS:        0.80 cm LVOT diam:     1.80 cm LVOT Area:     2.54 cm  LEFT ATRIUM         Index LA diam:    2.70 cm 1.78 cm/m   AORTA Ao Root diam: 2.80 cm  SHUNTS Systemic Diam: 1.80 cm Kardie Tobb DO Electronically signed by Thomasene Ripple DO Signature Date/Time: 11/19/2022/12:23:02 PM    Final     Assessment / Plan:  47 y.o. female with a history of Crohn's disease status post subtotal colectomy with ileal colonic anastomosis narrowing, previously on Entyvio, who was admitted to the hospital  11/19/2022 with recurrent SBO. CTAP with contrast 11/18/2022 showed evidence of past subtotal colectomy with marked long segment small bowel dilation demonstrating mild mural thickening with abrupt caliber transition near the ileosigmoid anastomosis, suspicious for small-bowel obstruction.  No nausea or vomiting.  No  abdominal pain.  She passed a nonbloody loose with formed bits of stool earlier this morning.  Hemodynamically stable. -Water enema due at 10 AM and 11 AM -Flexible sigmoidoscopy to rule out active persistent Crohn's disease in the setting of being off all biologic therapy verses fibrostenotic disease. Proceed with flexible sigmoidoscopy scheduled today with our advanced endoscopist Dr. Meridee Score as she may require enteral Axious stenting -EGD to rule out UGI Crohn's at time of flexible sigmoidoscopy  -NPO  -IV fluids and pain management per the hospitalist  -CBC and BMP in am -Restart  Entyvio if flex sig shows significant Crohn's disease   Acute on chronic anemia. Admission Hg 10.1 -> 9.2 -> Hg 8.9 -> today Hg 9.2. MCV 79.2. Iron 22. TIBC 118. Ferritin 160. Vitamin B12 1,244. No overt GI bleeding.  -See plan above  -Consider IV iron during this hospitalization   Hyponatremia. Na+ 131.   History of breasts cancer 05/2020 s/p bilateral mastectomies, chemotherapy and radiation   Principal Problem:   Failure to thrive in adult Active Problems:   Generalized abdominal pain   History of Crohn's disease   Crohn's disease of small and large intestines with complication (HCC)   History of abdominal subtotal colectomy   Hypokalemia   Chronic disease anemia   Protein-calorie malnutrition, moderate (HCC)   Anorexia   Nausea   Abnormal CT scan, gastrointestinal tract   Colonic stricture (HCC)     LOS: 3 days   Arnaldo Natal  11/21/2022, 9:36AM

## 2022-11-21 NOTE — Interval H&P Note (Signed)
History and Physical Interval Note:  11/21/2022 12:29 PM  [name]  has presented today for surgery, with the diagnosis of Abnormal CT, Ileocolonic stenosis, possible axios stent.  The various methods of treatment have been discussed with the patient and family. After consideration of risks, benefits and other options for treatment, the patient has consented to  Procedure(s): FLEXIBLE SIGMOIDOSCOPY (N/A) ESOPHAGOGASTRODUODENOSCOPY (EGD) WITH PROPOFOL (N/A) as a surgical intervention.  The patient's history has been reviewed, patient examined, no change in status, stable for surgery.  I have reviewed the patient's chart and labs.  Questions were answered to the patient's satisfaction.     [employer]

## 2022-11-22 ENCOUNTER — Telehealth: Payer: Self-pay | Admitting: Nurse Practitioner

## 2022-11-22 ENCOUNTER — Telehealth: Payer: Self-pay | Admitting: Hematology and Oncology

## 2022-11-22 DIAGNOSIS — E440 Moderate protein-calorie malnutrition: Secondary | ICD-10-CM | POA: Diagnosis not present

## 2022-11-22 DIAGNOSIS — R627 Adult failure to thrive: Secondary | ICD-10-CM | POA: Diagnosis not present

## 2022-11-22 DIAGNOSIS — K50819 Crohn's disease of both small and large intestine with unspecified complications: Secondary | ICD-10-CM

## 2022-11-22 DIAGNOSIS — K56609 Unspecified intestinal obstruction, unspecified as to partial versus complete obstruction: Secondary | ICD-10-CM | POA: Diagnosis not present

## 2022-11-22 DIAGNOSIS — Z8719 Personal history of other diseases of the digestive system: Secondary | ICD-10-CM | POA: Diagnosis not present

## 2022-11-22 LAB — GLUCOSE, CAPILLARY
Glucose-Capillary: 209 mg/dL — ABNORMAL HIGH (ref 70–99)
Glucose-Capillary: 197 mg/dL — ABNORMAL HIGH (ref 70–99)
Glucose-Capillary: 197 mg/dL — ABNORMAL HIGH (ref 70–99)
Glucose-Capillary: 239 mg/dL — ABNORMAL HIGH (ref 70–99)
Glucose-Capillary: 212 mg/dL — ABNORMAL HIGH (ref 70–99)
Glucose-Capillary: 220 mg/dL — ABNORMAL HIGH (ref 70–99)
Glucose-Capillary: 253 mg/dL — ABNORMAL HIGH (ref 70–99)

## 2022-11-22 MED ORDER — ADULT MULTIVITAMIN W/MINERALS CH
1.0000 | ORAL_TABLET | Freq: Every day | ORAL | Status: DC
  Administered 2022-11-22 – 2022-11-23 (×2): 1 via ORAL
  Filled 2022-11-22 (×2): qty 1

## 2022-11-22 MED ORDER — PREDNISONE 20 MG PO TABS
40.0000 mg | ORAL_TABLET | Freq: Every day | ORAL | Status: DC
  Administered 2022-11-23: 40 mg via ORAL
  Filled 2022-11-22: qty 2

## 2022-11-22 MED ORDER — KATE FARMS STANDARD 1.4 PO LIQD
325.0000 mL | Freq: Two times a day (BID) | ORAL | Status: DC
  Administered 2022-11-22 – 2022-11-23 (×2): 325 mL via ORAL
  Filled 2022-11-22 (×4): qty 325

## 2022-11-22 NOTE — Telephone Encounter (Signed)
[name], pls contact patient and schedule hospital follow up appointment in 3 to 4 weeks with APP or Dr. Leone Payor.   Dr. Leone Payor, please review colonoscopy done as an inpatient 11/21/2022. She will be discharged home on a Prednisone taper with plans to restarting Entyvio (IV 300mg , week 0, 2, then SQ 108mg  Q2 weekly).   QuantiFERON gold pending.  Hepatitis B surface antigen and core total antibody levels were undetectable. Hepatitis B surface antibodies were nonreactive therefore she should receive hepatitis B vaccination with her PCP or in our office.  Dr. Leone Payor, pls verify if you agree with the above plan, if so, [name], please initiate insurance authorization for [place].   Thank you

## 2022-11-22 NOTE — Progress Notes (Addendum)
Nutrition Follow-up  DOCUMENTATION CODES:   Non-severe (moderate) malnutrition in context of chronic illness  INTERVENTION:   -[employer] 1.4 PO BID, each provides 455 kcals and 20g protein  -Multivitamin with minerals daily  NUTRITION DIAGNOSIS:   Moderate Malnutrition related to chronic illness, cancer and cancer related treatments (h/o Crohn's) as evidenced by moderate fat depletion, moderate muscle depletion, percent weight loss.  GOAL:   Patient will meet greater than or equal to 90% of their needs  Not met.  MONITOR:   PO intake, Supplement acceptance, Labs, Weight trends, I & O's  REASON FOR ASSESSMENT:   Consult Assessment of nutrition requirement/status  ASSESSMENT:   47 year old with past medical history significant for recent small bowel obstruction 6//20 04/2022, breast cancer in remission, Crohn's disease status post subtotal colectomy 2021, grade 1 diastolic dysfunction, presents complaining of poor oral intake for the last 2 or 3 weeks.  Completed chemotherapy 6 weeks ago.  Reported bowel movement which are irregular and the stools are pebble-like.  Reported abdominal pain.  CT abdomen and pelvis showed status post subtotal colectomy with MAC long segment small bowel dilation demonstrating mild-moderate thickening, similar to 09/27/2022 with abrupt caliber transition near the ileal sigmoid anastomosis, suspicious for small bowel obstruction.  8/13: s/p EGD and flex sigmoidoscopy   Patient in room, family at bedside. Pt reports feeling better today. Her daughter brought her some potato soup from [employer] to have this morning. Ate some of this. States she has been eating poorly for a week now. Typically she eats 2-3 meals a day. Tried a vegan shake last week which provides ~1200 kcals and 50g protein per serving. Advised pt to split this in half and consume once in the morning and then once later in the day. Pt states it does tend to fill her up. She likes Molli Posey  supplements so will order these for additional kcals and protein.  Pt denies any issues with chewing or swallowing. No taste changes.   Per weight records, pt has lost 25 lbs since 3/22 (18% wt loss x 4.5 months, significant for time frame).  Pt reports UBW ~129 lbs. Current weight: 107 lbs.  Medications: D5 infusion  Labs reviewed: CBGs: 83-209   NUTRITION - FOCUSED PHYSICAL EXAM:  Flowsheet Row Most Recent Value  Orbital Region No depletion  Upper Arm Region Moderate depletion  Thoracic and Lumbar Region Moderate depletion  Buccal Region No depletion  Temple Region Moderate depletion  Clavicle Bone Region Moderate depletion  Clavicle and Acromion Bone Region Moderate depletion  Scapular Bone Region Moderate depletion  Dorsal Hand No depletion  Patellar Region No depletion  Anterior Thigh Region No depletion  Posterior Calf Region No depletion  Edema (RD Assessment) None  Hair Unable to assess  [covered]  Eyes Reviewed  Mouth Reviewed  Skin Reviewed       Diet Order:   Diet Order             Diet full liquid Room service appropriate? Yes; Fluid consistency: Thin  Diet effective now                   EDUCATION NEEDS:   No education needs have been identified at this time  Skin:  Skin Assessment: Reviewed RN Assessment  Last BM:  8/13  Height:   Ht Readings from Last 1 Encounters:  11/18/22 5\' 4"  (1.626 m)    Weight:   Wt Readings from Last 1 Encounters:  11/22/22 48.9 kg  BMI:  Body mass index is 18.5 kg/m.  Estimated Nutritional Needs:   Kcal:  1650-1850  Protein:  85-95g  Fluid:  1.8L/day    Tilda Franco, MS, RD, LDN Inpatient Clinical Dietitian Contact information available via [employer]

## 2022-11-22 NOTE — Telephone Encounter (Signed)
Patient stated they have already meet with the Nurtionist and they will not be needing it scheduled

## 2022-11-22 NOTE — Progress Notes (Signed)
  Progress Note   Patient: [name] UEA:[id number] DOB: 09/15/1975 DOA: 11/18/2022     4 DOS: the patient was seen and examined on 11/22/2022   Brief hospital course: No notes on file  Assessment and Plan: Small bowel obstruction History of Crohn's disease Status post subtotal colectomy 2021 Patient presented with abdominal pain, poor oral intake, constipation.  CT scan concern for SBO. Seen by general surgery and GI Underwent sigmoidoscopy 8/13: nonbleeding nonthrombosed internal hemorrhoids, granularity in the remaining of the rectum and rectosigmoid.  Patent end to side ileocolonic anastomosis, characterized by a stenosis, congestion, erythema, friable mucosa and inflammatory and severe stenosis.  There was no ability to perform stenting.  Dilated neoterminal ileum. Improving, anticipate discharge tomorrow if tolerates diet advancement. F/u with GI in 3-4 weeks Discharge on Prednisone taper PO pred 40mg  po every day x 1 week, 30mg  every day x 1 week, then continue 20mg  po every day until FU visit  plans to restarting Entyvio (IV 300mg , week 0, 2, then SQ 108mg  Q2 weekly).  QuantiFERON gold pending.  Hepatitis B surface antigen and core total antibody levels were undetectable.   Hypokalemia -- resolved   Hypomagnesemia -- resolved   Chronic constipation Last BM 8/13  Breast cancer; Under the care of of Dr. Pamelia Hoit.  Last chemotherapy 6 weeks ago   Iron deficiency anemia; start iron supplement on discharge   Oral thrush:  Started Nystatin.    Non-severe (moderate) malnutrition in context of chronic illness     Subjective:  Feels better Tolerating diet, would like solids  Physical Exam: Vitals:   11/22/22 0042 11/22/22 0442 11/22/22 0500 11/22/22 0948  BP: 121/86 112/86  (!) 121/95  Pulse: (!) 105 88  96  Resp: 15 18  18   Temp: (!) 97.5 F (36.4 C) 97.7 F (36.5 C)  97.8 F (36.6 C)  TempSrc: Oral Oral  Oral  SpO2: 98% 100%  99%  Weight:   48.9 kg    Height:       Physical Exam Vitals reviewed.  Constitutional:      General: She is not in acute distress.    Appearance: She is not ill-appearing or toxic-appearing.  Cardiovascular:     Rate and Rhythm: Normal rate and regular rhythm.     Heart sounds: No murmur heard. Pulmonary:     Effort: Pulmonary effort is normal. No respiratory distress.     Breath sounds: No wheezing or rhonchi.  Neurological:     Mental Status: She is alert.  Psychiatric:        Mood and Affect: Mood normal.        Behavior: Behavior normal.     Data Reviewed: CBG stable  Family Communication: none  Disposition: Status is: Inpatient Remains inpatient appropriate because: SBO     Time spent: 20 minutes  Author: Brendia Sacks, MD 11/22/2022 11:43 AM  For on call review [email or web address].

## 2022-11-22 NOTE — Progress Notes (Addendum)
[facility] Progress Note  CC:   Crohn's disease, small bowel obstruction   Subjective: No nausea, vomiting or abdominal pain. She passed 2 small solid stools today.  No rectal bleeding or black stools.  No chest pain or shortness of breath.  She is tolerating a full liquid diet.  Daughter at the bedside.  Objective:   EGD 11/21/2022: - No gross lesions in the entire esophagus. Z-line irregular, 34 cm from the incisors.  - Erythematous mucosa in the stomach. Biopsied.  - No gross lesions in the duodenal bulb, in the first portion of the duodenum, in the second portion of the duodenum, in the major papilla and in the third portion of the duodenum. Biopsied.   Flex sigmoidoscopy 11/21/2022: - Perianal skin tags found on perianal exam. Hemorrhoids found on digital rectal exam.  - Non-bleeding non-thrombosed internal hemorrhoids.  - Granularity in the remaining rectum/rectosigmoid. Biopsied.  - Patent end-to-side ileo-colonic anastomosis, characterized by stenosis, congestion, erythema, friable mucosa, inflammation and severe stenosis. This was approximately 5 mm in diameter and 4 cm in length and only traversed with the neonatal endoscope. There is no ability to perform stenting.  - Dilated neoterminal ileum. Suctioned via endoscope.  - Segmental mild mucosal changes were found. Biopsied  Vital signs in last 24 hours: Temp:  [97.5 F (36.4 C)-98.8 F (37.1 C)] 97.8 F (36.6 C) (08/14 0948) Pulse Rate:  [88-121] 96 (08/14 0948) Resp:  [15-21] 18 (08/14 0948) BP: (110-128)/(84-97) 121/95 (08/14 0948) SpO2:  [98 %-100 %] 99 % (08/14 0948) Weight:  [48.9 kg] 48.9 kg (08/14 0500) Last BM Date : 11/21/22 General: 47 year old female in no acute distress. Heart: Regular rate and rhythm, no murmurs. Pulm: Breath sounds clear throughout. Chest: Right chest Port-A-Cath intact. Abdomen: Soft, nondistended.  Nontender.  Positive bowel sounds to all 4 quadrants. Extremities: No  edema.  Neurologic:  Alert and  oriented x 4. Grossly normal neurologically. Psych:  Alert and cooperative. Normal mood and affect.  Intake/Output from previous day: 08/13 0701 - 08/14 0700 In: 2078.1 [P.O.:240; I.V.:1838.1] Out: 1650 [Urine:1650] Intake/Output this shift: Total I/O In: 10 [I.V.:10] Out: -   Lab Results: Recent Labs    11/20/22 0252 11/21/22 0308  WBC 7.2 7.3  HGB 8.9* 9.2*  HCT 28.1* 29.1*  PLT 361 386   BMET Recent Labs    11/20/22 0252 11/21/22 0308  NA 131* 131*  K 3.6 3.7  CL 98 96*  CO2 24 23  GLUCOSE 82 65*  BUN 7 9  CREATININE 0.66 0.69  CALCIUM 7.6* 7.8*   LFT No results for input(s): "PROT", "ALBUMIN", "AST", "ALT", "ALKPHOS", "BILITOT", "BILIDIR", "IBILI" in the last 72 hours. PT/INR Recent Labs    11/20/22 0252  LABPROT 14.9  INR 1.1   Hepatitis Panel No results for input(s): "HEPBSAG", "HCVAB", "HEPAIGM", "HEPBIGM" in the last 72 hours.  No results found.  Assessment / Plan:  47 y.o. female with a history of Crohn's disease status post subtotal colectomy with ileal colonic anastomosis narrowing, previously on Entyvio. Admitted to the hospital  11/19/2022 with recurrent SBO. CTAP with contrast 11/18/2022 showed evidence of past subtotal colectomy with marked long segment small bowel dilation demonstrating mild mural thickening with abrupt caliber transition near the ileosigmoid anastomosis, suspicious for small-bowel obstruction.  No nausea or vomiting.  No abdominal pain. CRP 24.6.  EGD 8/13 without evidence of Crohn's disease. Flex sigmoidoscopy 8/13 identified patent end-to-side ileocolonic anastomosis with severe stenosis (measuring 5 mm in diameter  and 4 cm in length) consistent with active Crohn's disease. Started on Solumedrol 20mg  IV bid 8/13.  She passed 2 small brown formed stools today.  No N/V or abdominal pain. Hemodynamically stable. -Advance to full liquid diet -Continue Solu-Medrol 20 mg IV twice daily, transition to  oral Prednisone prior to discharge -Quaniferon gold and Hep B serologies (ordered in preparation for Entyvio induction), results pending -Restart Entyvio as an outpatient (after loading dose IV 300mg  week 0, 2, she would prefer SQ PEN 108mg  Q 2 weekly.  -CBC and BMP in am -Await further recommendations per Dr. Chales Abrahams    Acute on chronic anemia. Admission Hg 10.1 -> 9.2 -> Hg 8.9 -> today Hg 9.2. MCV 79.2. Iron 22. TIBC 118. Ferritin 160. Vitamin B12 1,244. No overt GI bleeding.  -See plan above  -Recommend IV iron during this hospitalization   Hyponatremia. Na+ 131.   History of breasts cancer 05/2020 s/p bilateral mastectomies, chemotherapy and radiation   Principal Problem:   Failure to thrive in adult Active Problems:   Generalized abdominal pain   History of Crohn's disease   Crohn's disease of small and large intestines with complication (HCC)   History of abdominal subtotal colectomy   Hypokalemia   Chronic disease anemia   Protein-calorie malnutrition, moderate (HCC)   Anorexia   Nausea   Abnormal CT scan, gastrointestinal tract   Colonic stricture (HCC)     LOS: 4 days   Arnaldo Natal  11/22/2022, 12:22 PM   Attending physician's note   I have taken history, reviewed the chart and examined the patient. I performed a substantive portion of this encounter, including complete performance of at least one of the key components, in conjunction with the APP. I agree with the Advanced Practitioner's note, impression and recommendations.   Crohn's disease s/p subtotal colectomy with ileocolonic stricture, active Crohn's disease - s/o recurrence (confirmed by flexible sigmoidoscopy yesterday). Adm with PSBO.  Responded well to IV steroids.  Tolerating p.o. without any problems.  Plan: -Advance diet as tolerated. -Transition from IV to PO pred 40mg  po every day x  1 week, 30mg  every day x 1 week, then continue 20mg  po every day until FU visit -Follow TB gold/Hep B  serologies/Bx -Plan to restart Entyvio (IV 300mg , week 0, 2, then SQ 108mg  Q2 weekly) as outpt -FU GI in 3-4 weeks. Colleen to kindly arrange for FU -Expect home tmr.  -D/W pt in detail -Will sign off for now.   Edman Circle, MD [facility] [phone]

## 2022-11-22 NOTE — Hospital Course (Signed)
47 year old with past medical history significant for recent small bowel obstruction 6//20 04/2022, breast cancer in remission, Crohn's disease status post subtotal colectomy 2021, grade 1 diastolic dysfunction, presents complaining of poor oral intake for the last 2 or 3 weeks.  Completed chemotherapy 6 weeks ago.  Reported bowel movement which are irregular and the stools are pebble-like.  Reported abdominal pain.  CT abdomen and pelvis showed status post subtotal colectomy with MAC long segment small bowel dilation demonstrating mild-moderate thickening, similar to 09/27/2022 with abrupt caliber transition near the ileal sigmoid anastomosis, suspicious for small bowel obstruction.   GI and surgery consulted and following.  Patient underwent upper GI endoscopy; show no gross lesion in the entire esophagus, erythematous mucosa in the stomach, no gross lesion in the duodenal bulb.  Flexible sigmoidoscopy: nonbleeding nonthrombosed internal hemorrhoids, granularity in the remaining of the rectum and rectosigmoid.  Patent end to side ileocolonic anastomosis, characterized by a stenosis, congestion, erythema, friable mucosa and inflammatory and severe stenosis.  There was no ability to perform stenting.  Dilated neoterminal ileum.  47 year old female history of breast cancer, Crohn's disease status post subtotal colectomy in 2021 and history of recurrent SBO presented today with decreased appetite , FTT, GI following, Sigmoidoscopy 8/13. Patient decline some meds.

## 2022-11-23 ENCOUNTER — Encounter: Payer: Self-pay | Admitting: Internal Medicine

## 2022-11-23 ENCOUNTER — Encounter: Payer: Self-pay | Admitting: Gastroenterology

## 2022-11-23 ENCOUNTER — Other Ambulatory Visit: Payer: Self-pay

## 2022-11-23 DIAGNOSIS — K50819 Crohn's disease of both small and large intestine with unspecified complications: Secondary | ICD-10-CM | POA: Diagnosis not present

## 2022-11-23 DIAGNOSIS — K56609 Unspecified intestinal obstruction, unspecified as to partial versus complete obstruction: Secondary | ICD-10-CM | POA: Diagnosis not present

## 2022-11-23 LAB — GLUCOSE, CAPILLARY
Glucose-Capillary: 157 mg/dL — ABNORMAL HIGH (ref 70–99)
Glucose-Capillary: 123 mg/dL — ABNORMAL HIGH (ref 70–99)
Glucose-Capillary: 154 mg/dL — ABNORMAL HIGH (ref 70–99)

## 2022-11-23 MED ORDER — SIMETHICONE 80 MG PO CHEW: 80.0000 mg | CHEWABLE_TABLET | Freq: Four times a day (QID) | ORAL | 0 refills | Status: DC | PRN

## 2022-11-23 MED ORDER — PREDNISONE 20 MG PO TABS: ORAL_TABLET | ORAL | 0 refills | Status: DC

## 2022-11-23 MED ORDER — HEPARIN SOD (PORK) LOCK FLUSH 100 UNIT/ML IV SOLN
500.0000 [IU] | INTRAVENOUS | Status: AC | PRN
  Administered 2022-11-23: 500 [IU]
  Filled 2022-11-23: qty 5

## 2022-11-23 MED ORDER — HYOSCYAMINE SULFATE 0.125 MG SL SUBL: 0.1250 mg | SUBLINGUAL_TABLET | Freq: Four times a day (QID) | SUBLINGUAL | 0 refills | Status: DC | PRN

## 2022-11-23 MED ORDER — NYSTATIN 100000 UNIT/ML MT SUSP: 5.0000 mL | Freq: Four times a day (QID) | OROMUCOSAL | 0 refills | Status: DC

## 2022-11-23 NOTE — Telephone Encounter (Signed)
I have entered Entyvio orders  Hepatitis B vaccination is not needed in this setting

## 2022-11-23 NOTE — Plan of Care (Signed)

## 2022-11-23 NOTE — Addendum Note (Signed)
Addended by: Stan Head E on: 11/23/2022 10:10 AM   Modules accepted: Orders

## 2022-11-23 NOTE — Discharge Summary (Signed)
Physician Discharge Summary   Patient: [name] MRN: [id number] DOB: 1976-02-04  Admit date:     11/18/2022  Discharge date: 11/23/22  Discharge Physician: Brendia Sacks   PCP: Ollen Bowl, MD   Recommendations at discharge:   Ongoing care of Crohn's disease  Discharge Diagnoses: Small bowel obstruction  Crohn's disease Hypokalemia Hypomagnesemia  Chronic constipation Breast cancer  Iron deficiency anemia  Oral thrush  Non-severe (moderate) malnutrition in context of chronic illness  Resolved Problems:   SBO (small bowel obstruction) (HCC)   Small bowel obstruction [facility])  Hospital Course: 47 year old female history of breast cancer, Crohn's disease status post subtotal colectomy in 2021 and history of recurrent SBO who presented with with decreased appetit, abdominal pain.  Admitted for small bowel obstruction.  Seen by gastroenterology and general surgery.  Underwent EGD and flexible sigmoidoscopy.  Started on steroids.  Clinically improved.  Plan discharge home on slow steroid taper with outpatient GI follow-up.  Consultants GI  Procedures 8/13 EGD no gross lesions 8/13 flex sig  Small bowel obstruction History of Crohn's disease Status post subtotal colectomy 2021 Patient presented with abdominal pain, poor oral intake, constipation.  CT scan concern for SBO. Seen by general surgery and GI Underwent sigmoidoscopy 8/13: nonbleeding nonthrombosed internal hemorrhoids, granularity in the remaining of the rectum and rectosigmoid.  Patent end to side ileocolonic anastomosis, characterized by a stenosis, congestion, erythema, friable mucosa and inflammatory and severe stenosis.  There was no ability to perform stenting.  Dilated neoterminal ileum. Improving, anticipate discharge tomorrow if tolerates diet advancement. F/u with GI in 3-4 weeks Discharge on Prednisone taper PO pred 40mg  po every day x 1 week, 30mg  every day x 1 week, then continue 20mg  po every  day until FU visit  plans on restarting Entyvio (IV 300mg , week 0, 2, then SQ 108mg  Q2 weekly).  QuantiFERON gold pending.  Hepatitis B surface antigen and core total antibody levels were undetectable.   Hypokalemia -- resolved   Hypomagnesemia -- resolved   Chronic constipation Last BM 8/13   Breast cancer; Under the care of of Dr. Pamelia Hoit.  Last chemotherapy 6 weeks ago   Iron deficiency anemia; start iron supplement on discharge   Oral thrush  Nystatin.    Non-severe (moderate) malnutrition in context of chronic illness  Disposition: Home Diet recommendation:  Regular diet DISCHARGE MEDICATION: Allergies as of 11/23/2022       Reactions   Nsaids Other (See Comments)   Crohns disease/ IBD   Other    BLOOD PRODUCT REFUSAL        Medication List     TAKE these medications    acetaminophen 500 MG tablet Commonly known as: TYLENOL Take 500-1,000 mg by mouth every 6 (six) hours as needed for mild pain.   hyoscyamine 0.125 MG SL tablet Commonly known as: LEVSIN SL Place 1 tablet (0.125 mg total) under the tongue every 6 (six) hours as needed for cramping.   multivitamin with minerals tablet Take 1 tablet by mouth daily.   nystatin 100000 UNIT/ML suspension Commonly known as: MYCOSTATIN Take 5 mLs (500,000 Units total) by mouth 4 (four) times daily.   potassium chloride SA 20 MEQ tablet Commonly known as: KLOR-CON M Take 1 tablet (20 mEq total) by mouth daily.   predniSONE 20 MG tablet Commonly known as: DELTASONE Take 2 tablets (40 mg total) by mouth daily with breakfast for 7 days, THEN 1.5 tablets (30 mg total) daily with breakfast for 7 days, THEN 1  tablet (20 mg total) daily with breakfast for 21 days. Start taking on: November 24, 2022   simethicone 80 MG chewable tablet Commonly known as: MYLICON Chew 1 tablet (80 mg total) by mouth 4 (four) times daily as needed for flatulence.        Follow-up Information     Pahwani, Kasandra Knudsen, MD. Schedule an  appointment as soon as possible for a visit in 1 week(s).   Specialty: Internal Medicine Contact information: [address] [address] [phone]                Feels better  Discharge Exam: Filed Weights   11/21/22 0500 11/22/22 0500 11/23/22 0335  Weight: 49.1 kg 48.9 kg 46.7 kg   Physical Exam Vitals reviewed.  Constitutional:      General: She is not in acute distress.    Appearance: She is not ill-appearing or toxic-appearing.  Cardiovascular:     Rate and Rhythm: Normal rate and regular rhythm.     Heart sounds: No murmur heard. Pulmonary:     Effort: Pulmonary effort is normal. No respiratory distress.     Breath sounds: No wheezing, rhonchi or rales.  Neurological:     Mental Status: She is alert.  Psychiatric:        Mood and Affect: Mood normal.        Behavior: Behavior normal.      Condition at discharge: good  The results of significant diagnostics from this hospitalization (including imaging, microbiology, ancillary and laboratory) are listed below for reference.   Imaging Studies: ECHOCARDIOGRAM LIMITED  Result Date: 11/19/2022    ECHOCARDIOGRAM LIMITED REPORT   Patient Name:   TAHINA MCKANE Date of Exam: 11/19/2022 Medical Rec #:  [id number]        Height:       64.0 in Accession #:    [id number]       Weight:       110.0 lb Date of Birth:  [birth year]        BSA:          1.517 m Patient Age:    46 years         BP:           112/82 mmHg Patient Gender: F                HR:           108 bpm. Exam Location:  Inpatient Procedure: Limited Echo, Color Doppler and Cardiac Doppler Indications:    Ventricular Tachycardia  History:        Patient has prior history of Echocardiogram examinations, most                 recent 09/18/2022. Arrythmias:Tachycardia.  Sonographer:    Milbert Coulter Referring Phys: [id number] SUBRINA SUNDIL  Sonographer Comments: Suboptimal apical window. Image acquisition challenging due to breast implants.  IMPRESSIONS  1. Left ventricular ejection fraction, by estimation, is 60 to 65%. The left ventricle has normal function. The left ventricle has no regional wall motion abnormalities. Left ventricular diastolic parameters are indeterminate.  2. Right ventricular systolic function is normal. The right ventricular size is normal.  3. The mitral valve is normal in structure. No evidence of mitral valve regurgitation. No evidence of mitral stenosis.  4. The aortic valve is normal in structure. Aortic valve regurgitation is not visualized. No aortic stenosis is present.  5. The inferior vena cava is normal  in size with greater than 50% respiratory variability, suggesting right atrial pressure of 3 mmHg. FINDINGS  Left Ventricle: Left ventricular ejection fraction, by estimation, is 60 to 65%. The left ventricle has normal function. The left ventricle has no regional wall motion abnormalities. The left ventricular internal cavity size was normal in size. There is  no left ventricular hypertrophy. Left ventricular diastolic parameters are indeterminate. Right Ventricle: The right ventricular size is normal. No increase in right ventricular wall thickness. Right ventricular systolic function is normal. Left Atrium: Left atrial size was normal in size. Right Atrium: Right atrial size was normal in size. Pericardium: Trivial pericardial effusion is present. The pericardial effusion is circumferential. Mitral Valve: The mitral valve is normal in structure. No evidence of mitral valve stenosis. Tricuspid Valve: The tricuspid valve is normal in structure. Tricuspid valve regurgitation is not demonstrated. No evidence of tricuspid stenosis. Aortic Valve: The aortic valve is normal in structure. Aortic valve regurgitation is not visualized. No aortic stenosis is present. Pulmonic Valve: The pulmonic valve was normal in structure. Pulmonic valve regurgitation is not visualized. No evidence of pulmonic stenosis. Aorta: The aortic  root is normal in size and structure. Venous: The inferior vena cava is normal in size with greater than 50% respiratory variability, suggesting right atrial pressure of 3 mmHg. IAS/Shunts: No atrial level shunt detected by color flow Doppler. Additional Comments: Spectral Doppler performed. Color Doppler performed.  LEFT VENTRICLE PLAX 2D LVIDd:         3.50 cm LVIDs:         2.20 cm LV PW:         0.90 cm LV IVS:        0.80 cm LVOT diam:     1.80 cm LVOT Area:     2.54 cm  LEFT ATRIUM         Index LA diam:    2.70 cm 1.78 cm/m   AORTA Ao Root diam: 2.80 cm  SHUNTS Systemic Diam: 1.80 cm Kardie Tobb DO Electronically signed by Thomasene Ripple DO Signature Date/Time: 11/19/2022/12:23:02 PM    Final    CT ABDOMEN PELVIS W CONTRAST  Result Date: 11/18/2022 CLINICAL DATA:  History of breast cancer on chemotherapy with loss of appetite. * Tracking Code: BO * EXAM: CT ANGIOGRAPHY CHEST CT ABDOMEN AND PELVIS WITH CONTRAST TECHNIQUE: Multidetector CT imaging of the chest was performed using the standard protocol during bolus administration of intravenous contrast. Multiplanar CT image reconstructions and MIPs were obtained to evaluate the vascular anatomy. Multidetector CT imaging of the abdomen and pelvis was performed using the standard protocol during bolus administration of intravenous contrast. RADIATION DOSE REDUCTION: This exam was performed according to the departmental dose-optimization program which includes automated exposure control, adjustment of the mA and/or kV according to patient size and/or use of iterative reconstruction technique. CONTRAST:  OMNIPAQUE IOHEXOL 300 MG/ML  SOLN COMPARISON:  CT abdomen and pelvis dated 09/27/2022, CT chest dated 04/06/2022 FINDINGS: CTA CHEST FINDINGS Cardiovascular: Left chest wall port terminates in the right atrium. The study is high quality for the evaluation of pulmonary embolism. There are no filling defects in the central, lobar, segmental or subsegmental  pulmonary artery branches to suggest acute pulmonary embolism. Anatomic variant common origin of the brachiocephalic and common carotid arteries. Normal heart size. No significant pericardial fluid/thickening. Mediastinum/Nodes: Imaged thyroid gland without nodules meeting criteria for imaging follow-up by size. Normal esophagus. No pathologically enlarged axillary, supraclavicular, mediastinal, or hilar lymph nodes. Lungs/Pleura: The central  airways are patent. Unchanged subpleural ground-glass densities along the anterior right upper and middle lobe. Unchanged small area of perifissural ground-glass opacity is also seen in the anterior right lower lobe. Unchanged 3 mm basilar left lower lobe nodule (6:88). No pneumothorax. No pleural effusion. Musculoskeletal: No acute or abnormal lytic or blastic osseous lesions. Right mastectomy. Left breast implant. Review of the MIP images confirms the above findings. CT ABDOMEN and PELVIS FINDINGS Hepatobiliary: Subcentimeter hypoattenuating focus in segment 5 (2:23) too small to characterize but unchanged. No intra or extrahepatic biliary ductal dilation. Normal gallbladder. Pancreas: No focal lesions or main ductal dilation. Spleen: Normal in size without focal abnormality. Adrenals/Urinary Tract: No adrenal nodules. No suspicious renal mass, calculi, or hydronephrosis. No focal bladder wall thickening. Stomach/Bowel: Normal appearance of the stomach. Status post subtotal colectomy with ileosigmoid anastomosis. Marked long segment small bowel dilation demonstrating mild mural thickening, similar to 09/27/2022, with abrupt caliber transition near the ileosigmoid anastomosis (5:51). Vascular/Lymphatic: No significant vascular findings are present. No enlarged abdominal or pelvic lymph nodes. Reproductive: Enlarged and lobulated uterus containing multifocal enhancing masses, including a few with coarse calcifications. No adnexal masses. Other: No free fluid, fluid collection,  or free air. Musculoskeletal: No acute or abnormal lytic or blastic osseous lesions. IMPRESSION: 1. No evidence of pulmonary embolism. 2. Status post subtotal colectomy with marked long segment small bowel dilation demonstrating mild mural thickening, similar to 09/27/2022, with abrupt caliber transition near the ileosigmoid anastomosis, suspicious for small-bowel obstruction. 3. Unchanged subpleural ground-glass densities along the anterior right upper and middle lobe, likely postradiation change. 4. 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 5. Unchanged 3 mm basilar left lower lobe nodule. Electronically Signed   By: Agustin Cree M.D.   On: 11/18/2022 18:33   CT Angio Chest PE W/Cm &/Or Wo Cm  Result Date: 11/18/2022 CLINICAL DATA:  History of breast cancer on chemotherapy with loss of appetite. * Tracking Code: BO * EXAM: CT ANGIOGRAPHY CHEST CT ABDOMEN AND PELVIS WITH CONTRAST TECHNIQUE: Multidetector CT imaging of the chest was performed using the standard protocol during bolus administration of intravenous contrast. Multiplanar CT image reconstructions and MIPs were obtained to evaluate the vascular anatomy. Multidetector CT imaging of the abdomen and pelvis was performed using the standard protocol during bolus administration of intravenous contrast. RADIATION DOSE REDUCTION: This exam was performed according to the departmental dose-optimization program which includes automated exposure control, adjustment of the mA and/or kV according to patient size and/or use of iterative reconstruction technique. CONTRAST:  OMNIPAQUE IOHEXOL 300 MG/ML  SOLN COMPARISON:  CT abdomen and pelvis dated 09/27/2022, CT chest dated 04/06/2022 FINDINGS: CTA CHEST FINDINGS Cardiovascular: Left chest wall port terminates in the right atrium. The study is high quality for the evaluation of pulmonary embolism. There are no  filling defects in the central, lobar, segmental or subsegmental pulmonary artery branches to suggest acute pulmonary embolism. Anatomic variant common origin of the brachiocephalic and common carotid arteries. Normal heart size. No significant pericardial fluid/thickening. Mediastinum/Nodes: Imaged thyroid gland without nodules meeting criteria for imaging follow-up by size. Normal esophagus. No pathologically enlarged axillary, supraclavicular, mediastinal, or hilar lymph nodes. Lungs/Pleura: The central airways are patent. Unchanged subpleural ground-glass densities along the anterior right upper and middle lobe. Unchanged small area of perifissural ground-glass opacity is also seen in the anterior right lower lobe. Unchanged 3 mm basilar left lower lobe nodule (6:88). No pneumothorax. No pleural effusion. Musculoskeletal: No acute  or abnormal lytic or blastic osseous lesions. Right mastectomy. Left breast implant. Review of the MIP images confirms the above findings. CT ABDOMEN and PELVIS FINDINGS Hepatobiliary: Subcentimeter hypoattenuating focus in segment 5 (2:23) too small to characterize but unchanged. No intra or extrahepatic biliary ductal dilation. Normal gallbladder. Pancreas: No focal lesions or main ductal dilation. Spleen: Normal in size without focal abnormality. Adrenals/Urinary Tract: No adrenal nodules. No suspicious renal mass, calculi, or hydronephrosis. No focal bladder wall thickening. Stomach/Bowel: Normal appearance of the stomach. Status post subtotal colectomy with ileosigmoid anastomosis. Marked long segment small bowel dilation demonstrating mild mural thickening, similar to 09/27/2022, with abrupt caliber transition near the ileosigmoid anastomosis (5:51). Vascular/Lymphatic: No significant vascular findings are present. No enlarged abdominal or pelvic lymph nodes. Reproductive: Enlarged and lobulated uterus containing multifocal enhancing masses, including a few with coarse  calcifications. No adnexal masses. Other: No free fluid, fluid collection, or free air. Musculoskeletal: No acute or abnormal lytic or blastic osseous lesions. IMPRESSION: 1. No evidence of pulmonary embolism. 2. Status post subtotal colectomy with marked long segment small bowel dilation demonstrating mild mural thickening, similar to 09/27/2022, with abrupt caliber transition near the ileosigmoid anastomosis, suspicious for small-bowel obstruction. 3. Unchanged subpleural ground-glass densities along the anterior right upper and middle lobe, likely postradiation change. 4. 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 5. Unchanged 3 mm basilar left lower lobe nodule. Electronically Signed   By: Agustin Cree M.D.   On: 11/18/2022 18:33    Microbiology: Results for orders placed or performed during the hospital encounter of 11/18/22  Culture, blood (routine x 2)     Status: None (Preliminary result)   Collection Time: 11/18/22 11:58 PM   Specimen: BLOOD LEFT ARM  Result Value Ref Range Status   Specimen Description   Final    BLOOD LEFT ARM Performed at [facility], [address], [place], [place] [address]    Special Requests   Final    BOTTLES DRAWN AEROBIC AND ANAEROBIC Blood Culture adequate volume Performed at [facility], [address] [place], [place] [address]    Culture   Final    NO GROWTH 4 DAYS Performed at [facility], [address] [place], [place] [address]    Report Status PENDING  Incomplete  Culture, blood (routine x 2)     Status: None (Preliminary result)   Collection Time: 11/18/22 11:58 PM   Specimen: BLOOD LEFT ARM  Result Value Ref Range Status   Specimen Description   Final    BLOOD LEFT ARM Performed at [facility], [address] [place], [place] [address]    Special Requests   Final    BOTTLES  DRAWN AEROBIC AND ANAEROBIC Blood Culture adequate volume Performed at [facility], [address] [place], [place] [address]    Culture   Final    NO GROWTH 4 DAYS Performed at [facility], [address], [place], [place] [address]    Report Status PENDING  Incomplete    Labs: CBC: Recent Labs  Lab 11/18/22 1707 11/19/22 0743 11/20/22 0252 11/21/22 0308  WBC 8.8 7.7 7.2 7.3  NEUTROABS 6.0  --   --   --   HGB 10.1* 9.2* 8.9* 9.2*  HCT 30.6* 28.9* 28.1* 29.1*  MCV 76.7* 78.7* 79.2* 78.6*  PLT 391 364 361 386   Basic Metabolic Panel: Recent Labs  Lab 11/18/22 1707 11/19/22 0044 11/19/22 0743 11/20/22 0252 11/21/22 0308  NA 137  --  132* 131* 131*  K 3.2*  --  3.7 3.6 3.7  CL 101  --  101 98 96*  CO2 25  --  20* 24 23  GLUCOSE 88  --  78 82 65*  BUN 10  --  7 7 9   CREATININE 0.61  --  0.66 0.66 0.69  CALCIUM 8.4*  --  8.0* 7.6* 7.8*  MG  --  1.8  --   --   --    Liver Function Tests: Recent Labs  Lab 11/18/22 1707  AST 9*  ALT 6  ALKPHOS 106  BILITOT 0.6  PROT 5.9*  ALBUMIN 2.9*   CBG: Recent Labs  Lab 11/22/22 1921 11/22/22 2322 11/23/22 0325 11/23/22 0723 11/23/22 1141  GLUCAP 220* 253* 157* 123* 154*    Discharge time spent: greater than 30 minutes.  Signed: Brendia Sacks, MD [facility] 11/23/2022

## 2022-11-23 NOTE — Telephone Encounter (Signed)
Patient already has a follow up in place for 12/14/22 with Dr. Leone Payor. Previously established with [facility], will route to Arminda Resides to make her aware new Entyvio orders & need for scheduling.

## 2022-11-23 NOTE — Progress Notes (Signed)
Nurse reviewed discharge instructions with pt.  Pt verbalized understanding of discharge instructions, follow up appointments and new medications.  No concerns at time of discharge. 

## 2022-11-24 LAB — CULTURE, BLOOD (ROUTINE X 2)
Culture: NO GROWTH
Culture: NO GROWTH
Special Requests: ADEQUATE
Special Requests: ADEQUATE

## 2022-11-25 LAB — QUANTIFERON-TB GOLD PLUS (RQFGPL)
QuantiFERON Mitogen Value: 0.12 [IU]/mL
QuantiFERON Nil Value: 0 [IU]/mL
QuantiFERON TB1 Ag Value: 0 [IU]/mL
QuantiFERON TB2 Ag Value: 0 [IU]/mL

## 2022-11-25 LAB — QUANTIFERON-TB GOLD PLUS: QuantiFERON-TB Gold Plus: UNDETERMINED — AB

## 2022-11-27 ENCOUNTER — Encounter (HOSPITAL_COMMUNITY): Payer: Self-pay | Admitting: Gastroenterology

## 2022-11-27 ENCOUNTER — Other Ambulatory Visit: Payer: Managed Care, Other (non HMO)

## 2022-11-27 ENCOUNTER — Other Ambulatory Visit: Payer: Self-pay

## 2022-11-27 DIAGNOSIS — K50112 Crohn's disease of large intestine with intestinal obstruction: Secondary | ICD-10-CM

## 2022-11-27 DIAGNOSIS — Z7960 Long term (current) use of unspecified immunomodulators and immunosuppressants: Secondary | ICD-10-CM

## 2022-11-27 DIAGNOSIS — R7989 Other specified abnormal findings of blood chemistry: Secondary | ICD-10-CM

## 2022-11-27 NOTE — Telephone Encounter (Signed)
Left message for pt to call back  °

## 2022-11-27 NOTE — Telephone Encounter (Signed)
Pt notified of recommendations.  Pt verbalized understanding with all questions answered.

## 2022-11-29 LAB — QUANTIFERON-TB GOLD PLUS
Mitogen-NIL: 1.61 [IU]/mL
NIL: 0.01 [IU]/mL
QuantiFERON-TB Gold Plus: NEGATIVE
TB1-NIL: 0 [IU]/mL
TB2-NIL: 0 [IU]/mL

## 2022-12-05 ENCOUNTER — Telehealth: Payer: Self-pay | Admitting: Pharmacy Technician

## 2022-12-05 NOTE — Telephone Encounter (Addendum)
FYI NOTE:  Patient will be scheduled as soon as possible.  Auth Submission: APPROVED Site of care: Site of care: [facility] Payer: [employer] Medication & CPT/J Code(s) submitted: Entyvio (Vedolizumab) [id number] Route of submission (phone, fax, portal):  Phone # Fax # Auth type: Buy/Bill PB Units/visits requested: 300MG  Q8WKS  2700 UNITS (9 doses) Reference number: [id number] Approval from: 11/30/22 to 11/29/23   [facility] co-pay card: Approved ID: [id number] GR: [id number] BIN: [id number] PCN: [id number] PHONE: [phone]

## 2022-12-06 ENCOUNTER — Ambulatory Visit: Payer: Managed Care, Other (non HMO)

## 2022-12-06 VITALS — BP 113/77 | HR 137 | Temp 98.7°F | Resp 18 | Ht 64.0 in | Wt 102.0 lb

## 2022-12-06 DIAGNOSIS — K50819 Crohn's disease of both small and large intestine with unspecified complications: Secondary | ICD-10-CM

## 2022-12-06 MED ORDER — VEDOLIZUMAB 300 MG IV SOLR
300.0000 mg | Freq: Once | INTRAVENOUS | Status: AC
  Administered 2022-12-06: 300 mg via INTRAVENOUS
  Filled 2022-12-06: qty 5

## 2022-12-06 NOTE — Progress Notes (Signed)
Diagnosis: Crohn's Disease  Provider:  Chilton Greathouse MD  Procedure: IV Infusion  IV Type: Peripheral, IV Location: L Antecubital  Entyvio (Vedolizumab), Dose: 300 mg  Infusion Start Time: 1450  Infusion Stop Time: 1522  Post Infusion IV Care: Peripheral IV Discontinued  Discharge: Condition: Good, Destination: Home . AVS Declined  Performed by:  Nat Math, RN

## 2022-12-12 ENCOUNTER — Inpatient Hospital Stay (HOSPITAL_COMMUNITY)
Admission: EM | Admit: 2022-12-12 | Discharge: 2023-01-16 | DRG: 329 | Disposition: A | Discharge status: Other Acute Inpatient Hospital | Payer: Managed Care, Other (non HMO) | Attending: Internal Medicine | Admitting: Internal Medicine

## 2022-12-12 ENCOUNTER — Emergency Department (HOSPITAL_COMMUNITY): Payer: Managed Care, Other (non HMO)

## 2022-12-12 ENCOUNTER — Other Ambulatory Visit: Payer: Self-pay

## 2022-12-12 DIAGNOSIS — R739 Hyperglycemia, unspecified: Secondary | ICD-10-CM | POA: Diagnosis not present

## 2022-12-12 DIAGNOSIS — N1330 Unspecified hydronephrosis: Secondary | ICD-10-CM | POA: Diagnosis not present

## 2022-12-12 DIAGNOSIS — K5010 Crohn's disease of large intestine without complications: Secondary | ICD-10-CM | POA: Diagnosis present

## 2022-12-12 DIAGNOSIS — Z803 Family history of malignant neoplasm of breast: Secondary | ICD-10-CM

## 2022-12-12 DIAGNOSIS — R54 Age-related physical debility: Secondary | ICD-10-CM | POA: Diagnosis present

## 2022-12-12 DIAGNOSIS — K55029 Acute infarction of small intestine, extent unspecified: Secondary | ICD-10-CM | POA: Diagnosis present

## 2022-12-12 DIAGNOSIS — Z8719 Personal history of other diseases of the digestive system: Secondary | ICD-10-CM

## 2022-12-12 DIAGNOSIS — C50919 Malignant neoplasm of unspecified site of unspecified female breast: Secondary | ICD-10-CM | POA: Diagnosis present

## 2022-12-12 DIAGNOSIS — K56609 Unspecified intestinal obstruction, unspecified as to partial versus complete obstruction: Secondary | ICD-10-CM | POA: Diagnosis present

## 2022-12-12 DIAGNOSIS — E861 Hypovolemia: Secondary | ICD-10-CM | POA: Diagnosis present

## 2022-12-12 DIAGNOSIS — F419 Anxiety disorder, unspecified: Secondary | ICD-10-CM | POA: Diagnosis present

## 2022-12-12 DIAGNOSIS — E43 Unspecified severe protein-calorie malnutrition: Secondary | ICD-10-CM | POA: Diagnosis present

## 2022-12-12 DIAGNOSIS — K50012 Crohn's disease of small intestine with intestinal obstruction: Secondary | ICD-10-CM

## 2022-12-12 DIAGNOSIS — I2693 Single subsegmental pulmonary embolism without acute cor pulmonale: Secondary | ICD-10-CM | POA: Diagnosis not present

## 2022-12-12 DIAGNOSIS — Z87891 Personal history of nicotine dependence: Secondary | ICD-10-CM

## 2022-12-12 DIAGNOSIS — Z9882 Breast implant status: Secondary | ICD-10-CM

## 2022-12-12 DIAGNOSIS — Z170 Estrogen receptor positive status [ER+]: Secondary | ICD-10-CM

## 2022-12-12 DIAGNOSIS — J690 Pneumonitis due to inhalation of food and vomit: Secondary | ICD-10-CM | POA: Diagnosis not present

## 2022-12-12 DIAGNOSIS — Z681 Body mass index (BMI) 19 or less, adult: Secondary | ICD-10-CM

## 2022-12-12 DIAGNOSIS — R64 Cachexia: Secondary | ICD-10-CM | POA: Diagnosis present

## 2022-12-12 DIAGNOSIS — Z886 Allergy status to analgesic agent status: Secondary | ICD-10-CM

## 2022-12-12 DIAGNOSIS — D638 Anemia in other chronic diseases classified elsewhere: Secondary | ICD-10-CM | POA: Diagnosis present

## 2022-12-12 DIAGNOSIS — K56699 Other intestinal obstruction unspecified as to partial versus complete obstruction: Secondary | ICD-10-CM | POA: Diagnosis present

## 2022-12-12 DIAGNOSIS — M109 Gout, unspecified: Secondary | ICD-10-CM | POA: Diagnosis present

## 2022-12-12 DIAGNOSIS — R1310 Dysphagia, unspecified: Secondary | ICD-10-CM | POA: Diagnosis not present

## 2022-12-12 DIAGNOSIS — E876 Hypokalemia: Secondary | ICD-10-CM | POA: Diagnosis present

## 2022-12-12 DIAGNOSIS — K50812 Crohn's disease of both small and large intestine with intestinal obstruction: Secondary | ICD-10-CM | POA: Diagnosis not present

## 2022-12-12 DIAGNOSIS — R188 Other ascites: Secondary | ICD-10-CM | POA: Diagnosis present

## 2022-12-12 DIAGNOSIS — A419 Sepsis, unspecified organism: Secondary | ICD-10-CM

## 2022-12-12 DIAGNOSIS — Z833 Family history of diabetes mellitus: Secondary | ICD-10-CM

## 2022-12-12 DIAGNOSIS — R627 Adult failure to thrive: Secondary | ICD-10-CM | POA: Diagnosis present

## 2022-12-12 DIAGNOSIS — I4721 Torsades de pointes: Secondary | ICD-10-CM | POA: Diagnosis present

## 2022-12-12 DIAGNOSIS — Z95828 Presence of other vascular implants and grafts: Secondary | ICD-10-CM

## 2022-12-12 DIAGNOSIS — E440 Moderate protein-calorie malnutrition: Secondary | ICD-10-CM

## 2022-12-12 DIAGNOSIS — R509 Fever, unspecified: Secondary | ICD-10-CM

## 2022-12-12 DIAGNOSIS — Z79899 Other long term (current) drug therapy: Secondary | ICD-10-CM

## 2022-12-12 DIAGNOSIS — I469 Cardiac arrest, cause unspecified: Secondary | ICD-10-CM

## 2022-12-12 DIAGNOSIS — D72829 Elevated white blood cell count, unspecified: Secondary | ICD-10-CM | POA: Diagnosis present

## 2022-12-12 DIAGNOSIS — K7200 Acute and subacute hepatic failure without coma: Secondary | ICD-10-CM | POA: Diagnosis not present

## 2022-12-12 DIAGNOSIS — I462 Cardiac arrest due to underlying cardiac condition: Secondary | ICD-10-CM | POA: Diagnosis present

## 2022-12-12 DIAGNOSIS — Z515 Encounter for palliative care: Secondary | ICD-10-CM

## 2022-12-12 DIAGNOSIS — J9601 Acute respiratory failure with hypoxia: Secondary | ICD-10-CM | POA: Diagnosis present

## 2022-12-12 DIAGNOSIS — I2699 Other pulmonary embolism without acute cor pulmonale: Secondary | ICD-10-CM

## 2022-12-12 DIAGNOSIS — Z9221 Personal history of antineoplastic chemotherapy: Secondary | ICD-10-CM

## 2022-12-12 DIAGNOSIS — E162 Hypoglycemia, unspecified: Secondary | ICD-10-CM | POA: Diagnosis not present

## 2022-12-12 DIAGNOSIS — K90829 Short bowel syndrome, unspecified: Secondary | ICD-10-CM

## 2022-12-12 DIAGNOSIS — K562 Volvulus: Secondary | ICD-10-CM

## 2022-12-12 DIAGNOSIS — Z7952 Long term (current) use of systemic steroids: Secondary | ICD-10-CM

## 2022-12-12 DIAGNOSIS — Z5320 Procedure and treatment not carried out because of patient's decision for unspecified reasons: Secondary | ICD-10-CM | POA: Diagnosis present

## 2022-12-12 DIAGNOSIS — D62 Acute posthemorrhagic anemia: Secondary | ICD-10-CM | POA: Diagnosis not present

## 2022-12-12 DIAGNOSIS — N170 Acute kidney failure with tubular necrosis: Secondary | ICD-10-CM | POA: Diagnosis not present

## 2022-12-12 DIAGNOSIS — Z853 Personal history of malignant neoplasm of breast: Secondary | ICD-10-CM

## 2022-12-12 DIAGNOSIS — R109 Unspecified abdominal pain: Principal | ICD-10-CM

## 2022-12-12 DIAGNOSIS — E871 Hypo-osmolality and hyponatremia: Secondary | ICD-10-CM | POA: Diagnosis present

## 2022-12-12 DIAGNOSIS — Z9013 Acquired absence of bilateral breasts and nipples: Secondary | ICD-10-CM

## 2022-12-12 DIAGNOSIS — R571 Hypovolemic shock: Secondary | ICD-10-CM | POA: Diagnosis present

## 2022-12-12 DIAGNOSIS — R630 Anorexia: Secondary | ICD-10-CM | POA: Diagnosis present

## 2022-12-12 DIAGNOSIS — M79A3 Nontraumatic compartment syndrome of abdomen: Secondary | ICD-10-CM

## 2022-12-12 DIAGNOSIS — Z934 Other artificial openings of gastrointestinal tract status: Secondary | ICD-10-CM

## 2022-12-12 DIAGNOSIS — D689 Coagulation defect, unspecified: Secondary | ICD-10-CM | POA: Diagnosis not present

## 2022-12-12 DIAGNOSIS — D6959 Other secondary thrombocytopenia: Secondary | ICD-10-CM | POA: Diagnosis present

## 2022-12-12 DIAGNOSIS — Z9049 Acquired absence of other specified parts of digestive tract: Secondary | ICD-10-CM

## 2022-12-12 DIAGNOSIS — R6521 Severe sepsis with septic shock: Secondary | ICD-10-CM | POA: Diagnosis not present

## 2022-12-12 DIAGNOSIS — D500 Iron deficiency anemia secondary to blood loss (chronic): Secondary | ICD-10-CM | POA: Diagnosis present

## 2022-12-12 DIAGNOSIS — E874 Mixed disorder of acid-base balance: Secondary | ICD-10-CM | POA: Diagnosis not present

## 2022-12-12 HISTORY — DX: Malignant neoplasm of unspecified site of unspecified female breast: C50.919

## 2022-12-12 LAB — CBC WITH DIFFERENTIAL/PLATELET
Abs Immature Granulocytes: 0.07 10*3/uL (ref 0.00–0.07)
Basophils Absolute: 0.1 10*3/uL (ref 0.0–0.1)
Basophils Relative: 1 %
Eosinophils Absolute: 0 10*3/uL (ref 0.0–0.5)
Eosinophils Relative: 0 %
HCT: 34.4 % — ABNORMAL LOW (ref 36.0–46.0)
Hemoglobin: 10.7 g/dL — ABNORMAL LOW (ref 12.0–15.0)
Immature Granulocytes: 1 %
Lymphocytes Relative: 21 %
Lymphs Abs: 2.3 10*3/uL (ref 0.7–4.0)
MCH: 25.7 pg — ABNORMAL LOW (ref 26.0–34.0)
MCHC: 31.1 g/dL (ref 30.0–36.0)
MCV: 82.5 fL (ref 80.0–100.0)
Monocytes Absolute: 0.3 10*3/uL (ref 0.1–1.0)
Monocytes Relative: 2 %
Neutro Abs: 8.1 10*3/uL — ABNORMAL HIGH (ref 1.7–7.7)
Neutrophils Relative %: 75 %
Platelets: 505 10*3/uL — ABNORMAL HIGH (ref 150–400)
RBC: 4.17 MIL/uL (ref 3.87–5.11)
RDW: 19 % — ABNORMAL HIGH (ref 11.5–15.5)
WBC Morphology: INCREASED
WBC: 10.8 10*3/uL — ABNORMAL HIGH (ref 4.0–10.5)
nRBC: 0.2 % (ref 0.0–0.2)

## 2022-12-12 LAB — COMPREHENSIVE METABOLIC PANEL
ALT: 17 U/L (ref 0–44)
AST: 16 U/L (ref 15–41)
Albumin: 2.3 g/dL — ABNORMAL LOW (ref 3.5–5.0)
Alkaline Phosphatase: 138 U/L — ABNORMAL HIGH (ref 38–126)
Anion gap: 13 (ref 5–15)
BUN: 13 mg/dL (ref 6–20)
CO2: 19 mmol/L — ABNORMAL LOW (ref 22–32)
Calcium: 7.7 mg/dL — ABNORMAL LOW (ref 8.9–10.3)
Chloride: 102 mmol/L (ref 98–111)
Creatinine, Ser: 0.74 mg/dL (ref 0.44–1.00)
GFR, Estimated: >60 mL/min (ref >60)
Glucose, Bld: 115 mg/dL — ABNORMAL HIGH (ref 70–99)
Potassium: 3.2 mmol/L — ABNORMAL LOW (ref 3.5–5.1)
Sodium: 134 mmol/L — ABNORMAL LOW (ref 135–145)
Total Bilirubin: 0.8 mg/dL (ref 0.3–1.2)
Total Protein: 6.5 g/dL (ref 6.5–8.1)

## 2022-12-12 LAB — LIPASE, BLOOD: Lipase: 19 U/L (ref 11–51)

## 2022-12-12 MED ORDER — MORPHINE SULFATE (PF) 4 MG/ML IV SOLN
4.0000 mg | Freq: Once | INTRAVENOUS | Status: AC
  Administered 2022-12-12: 4 mg via INTRAVENOUS
  Filled 2022-12-12: qty 1

## 2022-12-12 MED ORDER — PANTOPRAZOLE SODIUM 40 MG IV SOLR
40.0000 mg | Freq: Once | INTRAVENOUS | Status: AC
  Administered 2022-12-12: 40 mg via INTRAVENOUS
  Filled 2022-12-12: qty 10

## 2022-12-12 MED ORDER — MORPHINE SULFATE (PF) 4 MG/ML IV SOLN
6.0000 mg | Freq: Once | INTRAVENOUS | Status: AC
  Administered 2022-12-12: 6 mg via INTRAVENOUS
  Filled 2022-12-12: qty 2

## 2022-12-12 MED ORDER — LACTATED RINGERS IV BOLUS
2000.0000 mL | Freq: Once | INTRAVENOUS | Status: AC
  Administered 2022-12-12: 2000 mL via INTRAVENOUS

## 2022-12-12 MED ORDER — METHYLPREDNISOLONE SODIUM SUCC 125 MG IJ SOLR
125.0000 mg | Freq: Once | INTRAMUSCULAR | Status: DC
  Filled 2022-12-12: qty 2

## 2022-12-12 MED ORDER — LACTATED RINGERS IV SOLN: INTRAVENOUS | Status: DC

## 2022-12-12 MED ORDER — POTASSIUM CHLORIDE 10 MEQ/100ML IV SOLN
10.0000 meq | INTRAVENOUS | Status: AC
  Administered 2022-12-12 – 2022-12-13 (×2): 10 meq via INTRAVENOUS
  Filled 2022-12-12 (×2): qty 100

## 2022-12-12 NOTE — ED Triage Notes (Signed)
Patient BIB EMS for evaluation of abdominal pain.  Hx of crohn's and started having burning abdominal pain today.  Having nausea, vomiting, and diarrhea.  Given Fentanyl 100 mcg IV PTA.  Has breast CA. Currently in between treatment cycles.

## 2022-12-12 NOTE — ED Provider Notes (Addendum)
[place] EMERGENCY DEPARTMENT AT [facility] Provider Note   CSN: [id number] Arrival date & time: 12/12/22  2029     History  Chief Complaint  Patient presents with   Abdominal Pain    [name] is a 47 y.o. female.  47 year old female presents with severe abdominal pain which pain yesterday.  Has a history of Crohn's disease and abdominal pain started with a burning sensation epigastric region.  Has had nausea and emesis x 1.  Has history of prior small bowel obstruction.  Endorses abdominal distention.  Called EMS and was given 100 mcg of fentanyl with limited relief.  Denies any fever or chills.       Home Medications Prior to Admission medications   Medication Sig Start Date End Date Taking? Authorizing Provider  acetaminophen (TYLENOL) 500 MG tablet Take 500-1,000 mg by mouth every 6 (six) hours as needed for mild pain.   Yes [provider]  cholecalciferol (VITAMIN D3) 25 MCG (1000 UNIT) tablet Take 1,000 Units by mouth daily.   Yes [provider]  hyoscyamine (LEVSIN SL) 0.125 MG SL tablet Place 1 tablet (0.125 mg total) under the tongue every 6 (six) hours as needed for cramping. 11/23/22  Yes Standley Brooking, MD  Multiple Vitamins-Minerals (MULTIVITAMIN WITH MINERALS) tablet Take 1 tablet by mouth daily.   Yes [provider]  nystatin (MYCOSTATIN) 100000 UNIT/ML suspension Take 5 mLs (500,000 Units total) by mouth 4 (four) times daily. 11/23/22  Yes Standley Brooking, MD  potassium chloride SA (KLOR-CON M) 20 MEQ tablet Take 1 tablet (20 mEq total) by mouth daily. 12/27/21  Yes Iva Boop, MD  predniSONE (DELTASONE) 20 MG tablet Take 2 tablets (40 mg total) by mouth daily with breakfast for 7 days, THEN 1.5 tablets (30 mg total) daily with breakfast for 7 days, THEN 1 tablet (20 mg total) daily with breakfast for 21 days. 11/24/22 12/29/22 Yes Standley Brooking, MD  simethicone (MYLICON) 80 MG chewable tablet Chew 1  tablet (80 mg total) by mouth 4 (four) times daily as needed for flatulence. 11/23/22  Yes Standley Brooking, MD      Allergies    Nsaids and Other    Review of Systems   Review of Systems  All other systems reviewed and are negative.   Physical Exam Updated Vital Signs BP (!) 131/101   Pulse (!) 114   Resp (!) 23   LMP 04/24/2021   SpO2 98%  Physical Exam Vitals and nursing note reviewed.  Constitutional:      General: She is not in acute distress.    Appearance: Normal appearance. She is well-developed. She is not toxic-appearing.  HENT:     Head: Normocephalic and atraumatic.  Eyes:     General: Lids are normal.     Conjunctiva/sclera: Conjunctivae normal.     Pupils: Pupils are equal, round, and reactive to light.  Neck:     Thyroid: No thyroid mass.     Trachea: No tracheal deviation.  Cardiovascular:     Rate and Rhythm: Regular rhythm. Tachycardia present.     Heart sounds: Normal heart sounds. No murmur heard.    No gallop.  Pulmonary:     Effort: Pulmonary effort is normal. No respiratory distress.     Breath sounds: Normal breath sounds. No stridor. No decreased breath sounds, wheezing, rhonchi or rales.  Abdominal:     General: There is distension.     Palpations: Abdomen is  soft.     Tenderness: There is generalized abdominal tenderness. There is guarding. There is no rebound.  Musculoskeletal:        General: No tenderness. Normal range of motion.     Cervical back: Normal range of motion and neck supple.  Skin:    General: Skin is warm and dry.     Findings: No abrasion or rash.  Neurological:     Mental Status: She is alert and oriented to person, place, and time. Mental status is at baseline.     GCS: GCS eye subscore is 4. GCS verbal subscore is 5. GCS motor subscore is 6.     Cranial Nerves: Cranial nerves are intact. No cranial nerve deficit.     Sensory: No sensory deficit.     Motor: Motor function is intact.  Psychiatric:        Attention  and Perception: Attention normal.        Speech: Speech normal.        Behavior: Behavior normal.     ED Results / Procedures / Treatments   Labs (all labs ordered are listed, but only abnormal results are displayed) Labs Reviewed  COMPREHENSIVE METABOLIC PANEL  LIPASE, BLOOD  CBC WITH DIFFERENTIAL/PLATELET  URINALYSIS, ROUTINE W REFLEX MICROSCOPIC    EKG None  Radiology No results found.  Procedures Procedures    Medications Ordered in ED Medications  lactated ringers bolus 2,000 mL (has no administration in time range)  lactated ringers infusion (has no administration in time range)  morphine (PF) 4 MG/ML injection 6 mg (has no administration in time range)    ED Course/ Medical Decision Making/ A&P                                 Medical Decision Making Amount and/or Complexity of Data Reviewed Labs: ordered. Radiology: ordered.  Risk Prescription drug management.   Patient with severe tachycardia here.  She appears to be very uncomfortable.  Given IV fluids along with morphine.  Heart rate slightly improved.  Pain is somewhat relieved.  Patient notes that she has been on prednisone taper and states that as she goes down her prednisone her symptoms tend to return.  Endorses that she has had gastritis like symptoms.  Was started on IV Protonix for this.  Abdominal CT per my review and radiology interpretation shows no significant change from prior exam.  Patient redosed with pain medication.  Feel that there may be some chronic obstruction present.  Patient remains tachycardic at this time.  Will require admission.  Will consult hospitalist team  CRITICAL CARE Performed by: Toy Baker Total critical care time: 60 minutes Critical care time was exclusive of separately billable procedures and treating other patients. Critical care was necessary to treat or prevent imminent or life-threatening deterioration. Critical care was time spent personally by me on the  following activities: development of treatment plan with patient and/or surrogate as well as nursing, discussions with consultants, evaluation of patient's response to treatment, examination of patient, obtaining history from patient or surrogate, ordering and performing treatments and interventions, ordering and review of laboratory studies, ordering and review of radiographic studies, pulse oximetry and re-evaluation of patient's condition.         Final Clinical Impression(s) / ED Diagnoses Final diagnoses:  None    Rx / DC Orders ED Discharge Orders     None  Lorre Nick, MD 12/12/22 [place]    Lorre Nick, MD 12/12/22 [phone]

## 2022-12-13 ENCOUNTER — Inpatient Hospital Stay (HOSPITAL_COMMUNITY): Payer: Managed Care, Other (non HMO)

## 2022-12-13 ENCOUNTER — Encounter (HOSPITAL_COMMUNITY): Payer: Self-pay

## 2022-12-13 ENCOUNTER — Observation Stay (HOSPITAL_COMMUNITY): Payer: Managed Care, Other (non HMO)

## 2022-12-13 DIAGNOSIS — K50812 Crohn's disease of both small and large intestine with intestinal obstruction: Secondary | ICD-10-CM | POA: Diagnosis present

## 2022-12-13 DIAGNOSIS — Z934 Other artificial openings of gastrointestinal tract status: Secondary | ICD-10-CM | POA: Diagnosis not present

## 2022-12-13 DIAGNOSIS — M79A3 Nontraumatic compartment syndrome of abdomen: Secondary | ICD-10-CM | POA: Diagnosis not present

## 2022-12-13 DIAGNOSIS — I2699 Other pulmonary embolism without acute cor pulmonale: Secondary | ICD-10-CM | POA: Diagnosis not present

## 2022-12-13 DIAGNOSIS — R64 Cachexia: Secondary | ICD-10-CM | POA: Diagnosis present

## 2022-12-13 DIAGNOSIS — R571 Hypovolemic shock: Secondary | ICD-10-CM | POA: Diagnosis present

## 2022-12-13 DIAGNOSIS — R000 Tachycardia, unspecified: Secondary | ICD-10-CM | POA: Diagnosis not present

## 2022-12-13 DIAGNOSIS — Z681 Body mass index (BMI) 19 or less, adult: Secondary | ICD-10-CM | POA: Diagnosis not present

## 2022-12-13 DIAGNOSIS — I2693 Single subsegmental pulmonary embolism without acute cor pulmonale: Secondary | ICD-10-CM | POA: Diagnosis not present

## 2022-12-13 DIAGNOSIS — I462 Cardiac arrest due to underlying cardiac condition: Secondary | ICD-10-CM | POA: Diagnosis present

## 2022-12-13 DIAGNOSIS — D72829 Elevated white blood cell count, unspecified: Secondary | ICD-10-CM | POA: Diagnosis not present

## 2022-12-13 DIAGNOSIS — E874 Mixed disorder of acid-base balance: Secondary | ICD-10-CM | POA: Diagnosis not present

## 2022-12-13 DIAGNOSIS — K562 Volvulus: Secondary | ICD-10-CM | POA: Diagnosis present

## 2022-12-13 DIAGNOSIS — Z86711 Personal history of pulmonary embolism: Secondary | ICD-10-CM | POA: Diagnosis not present

## 2022-12-13 DIAGNOSIS — D62 Acute posthemorrhagic anemia: Secondary | ICD-10-CM | POA: Diagnosis not present

## 2022-12-13 DIAGNOSIS — K56609 Unspecified intestinal obstruction, unspecified as to partial versus complete obstruction: Secondary | ICD-10-CM

## 2022-12-13 DIAGNOSIS — K90829 Short bowel syndrome, unspecified: Secondary | ICD-10-CM | POA: Diagnosis not present

## 2022-12-13 DIAGNOSIS — I469 Cardiac arrest, cause unspecified: Secondary | ICD-10-CM

## 2022-12-13 DIAGNOSIS — N1330 Unspecified hydronephrosis: Secondary | ICD-10-CM | POA: Diagnosis not present

## 2022-12-13 DIAGNOSIS — J690 Pneumonitis due to inhalation of food and vomit: Secondary | ICD-10-CM | POA: Diagnosis not present

## 2022-12-13 DIAGNOSIS — K5090 Crohn's disease, unspecified, without complications: Secondary | ICD-10-CM | POA: Diagnosis not present

## 2022-12-13 DIAGNOSIS — D500 Iron deficiency anemia secondary to blood loss (chronic): Secondary | ICD-10-CM | POA: Diagnosis not present

## 2022-12-13 DIAGNOSIS — D638 Anemia in other chronic diseases classified elsewhere: Secondary | ICD-10-CM | POA: Diagnosis present

## 2022-12-13 DIAGNOSIS — R509 Fever, unspecified: Secondary | ICD-10-CM | POA: Diagnosis not present

## 2022-12-13 DIAGNOSIS — Z170 Estrogen receptor positive status [ER+]: Secondary | ICD-10-CM

## 2022-12-13 DIAGNOSIS — K56699 Other intestinal obstruction unspecified as to partial versus complete obstruction: Secondary | ICD-10-CM | POA: Diagnosis not present

## 2022-12-13 DIAGNOSIS — A419 Sepsis, unspecified organism: Secondary | ICD-10-CM | POA: Diagnosis not present

## 2022-12-13 DIAGNOSIS — E876 Hypokalemia: Secondary | ICD-10-CM | POA: Diagnosis not present

## 2022-12-13 DIAGNOSIS — C50411 Malignant neoplasm of upper-outer quadrant of right female breast: Secondary | ICD-10-CM

## 2022-12-13 DIAGNOSIS — J9601 Acute respiratory failure with hypoxia: Secondary | ICD-10-CM | POA: Diagnosis present

## 2022-12-13 DIAGNOSIS — Z452 Encounter for adjustment and management of vascular access device: Secondary | ICD-10-CM | POA: Diagnosis not present

## 2022-12-13 DIAGNOSIS — N170 Acute kidney failure with tubular necrosis: Secondary | ICD-10-CM | POA: Diagnosis not present

## 2022-12-13 DIAGNOSIS — Z9049 Acquired absence of other specified parts of digestive tract: Secondary | ICD-10-CM | POA: Diagnosis not present

## 2022-12-13 DIAGNOSIS — E440 Moderate protein-calorie malnutrition: Secondary | ICD-10-CM | POA: Diagnosis present

## 2022-12-13 DIAGNOSIS — K50012 Crohn's disease of small intestine with intestinal obstruction: Secondary | ICD-10-CM | POA: Diagnosis not present

## 2022-12-13 DIAGNOSIS — R1084 Generalized abdominal pain: Secondary | ICD-10-CM

## 2022-12-13 DIAGNOSIS — K55029 Acute infarction of small intestine, extent unspecified: Secondary | ICD-10-CM | POA: Diagnosis present

## 2022-12-13 DIAGNOSIS — K7200 Acute and subacute hepatic failure without coma: Secondary | ICD-10-CM | POA: Diagnosis not present

## 2022-12-13 DIAGNOSIS — R609 Edema, unspecified: Secondary | ICD-10-CM | POA: Diagnosis not present

## 2022-12-13 DIAGNOSIS — K5010 Crohn's disease of large intestine without complications: Secondary | ICD-10-CM | POA: Diagnosis not present

## 2022-12-13 DIAGNOSIS — R6521 Severe sepsis with septic shock: Secondary | ICD-10-CM | POA: Diagnosis not present

## 2022-12-13 DIAGNOSIS — E871 Hypo-osmolality and hyponatremia: Secondary | ICD-10-CM | POA: Diagnosis present

## 2022-12-13 DIAGNOSIS — D6959 Other secondary thrombocytopenia: Secondary | ICD-10-CM | POA: Diagnosis present

## 2022-12-13 DIAGNOSIS — E43 Unspecified severe protein-calorie malnutrition: Secondary | ICD-10-CM | POA: Diagnosis not present

## 2022-12-13 DIAGNOSIS — D689 Coagulation defect, unspecified: Secondary | ICD-10-CM | POA: Diagnosis not present

## 2022-12-13 DIAGNOSIS — K559 Vascular disorder of intestine, unspecified: Secondary | ICD-10-CM | POA: Diagnosis not present

## 2022-12-13 DIAGNOSIS — I2609 Other pulmonary embolism with acute cor pulmonale: Secondary | ICD-10-CM | POA: Diagnosis not present

## 2022-12-13 DIAGNOSIS — I4721 Torsades de pointes: Secondary | ICD-10-CM | POA: Diagnosis present

## 2022-12-13 DIAGNOSIS — R109 Unspecified abdominal pain: Secondary | ICD-10-CM | POA: Diagnosis not present

## 2022-12-13 HISTORY — DX: Crohn's disease of large intestine without complications: K50.10

## 2022-12-13 LAB — PROTIME-INR
INR: 1.4 — ABNORMAL HIGH (ref 0.8–1.2)
Prothrombin Time: 17.4 s — ABNORMAL HIGH (ref 11.4–15.2)

## 2022-12-13 LAB — GLUCOSE, CAPILLARY
Glucose-Capillary: 154 mg/dL — ABNORMAL HIGH (ref 70–99)
Glucose-Capillary: 162 mg/dL — ABNORMAL HIGH (ref 70–99)
Glucose-Capillary: 124 mg/dL — ABNORMAL HIGH (ref 70–99)
Glucose-Capillary: 117 mg/dL — ABNORMAL HIGH (ref 70–99)

## 2022-12-13 LAB — URINALYSIS, ROUTINE W REFLEX MICROSCOPIC
Bilirubin Urine: NEGATIVE
Glucose, UA: 50 mg/dL — AB
Ketones, ur: NEGATIVE mg/dL
Leukocytes,Ua: NEGATIVE
Nitrite: NEGATIVE
Protein, ur: 100 mg/dL — AB
RBC / HPF: >50 RBC/hpf (ref 0–5)
Specific Gravity, Urine: 1.012 (ref 1.005–1.030)
WBC, UA: >50 WBC/hpf (ref 0–5)
pH: 6 (ref 5.0–8.0)

## 2022-12-13 LAB — I-STAT CHEM 8, ED
BUN: 15 mg/dL (ref 6–20)
Calcium, Ion: 1.87 mmol/L (ref 1.15–1.40)
Chloride: 113 mmol/L — ABNORMAL HIGH (ref 98–111)
Creatinine, Ser: 1.2 mg/dL — ABNORMAL HIGH (ref 0.44–1.00)
Glucose, Bld: 157 mg/dL — ABNORMAL HIGH (ref 70–99)
HCT: 17 % — ABNORMAL LOW (ref 36.0–46.0)
Hemoglobin: 5.8 g/dL — CL (ref 12.0–15.0)
Potassium: 4.9 mmol/L (ref 3.5–5.1)
Sodium: 143 mmol/L (ref 135–145)
TCO2: 25 mmol/L (ref 22–32)

## 2022-12-13 LAB — CBC
HCT: 21.1 % — ABNORMAL LOW (ref 36.0–46.0)
HCT: 37.3 % (ref 36.0–46.0)
HCT: 37.7 % (ref 36.0–46.0)
Hemoglobin: 6.2 g/dL — CL (ref 12.0–15.0)
Hemoglobin: 12.1 g/dL (ref 12.0–15.0)
Hemoglobin: 12.7 g/dL (ref 12.0–15.0)
MCH: 25.5 pg — ABNORMAL LOW (ref 26.0–34.0)
MCH: 27.5 pg (ref 26.0–34.0)
MCH: 27.9 pg (ref 26.0–34.0)
MCHC: 29.4 g/dL — ABNORMAL LOW (ref 30.0–36.0)
MCHC: 32.4 g/dL (ref 30.0–36.0)
MCHC: 33.7 g/dL (ref 30.0–36.0)
MCV: 86.8 fL (ref 80.0–100.0)
MCV: 84.8 fL (ref 80.0–100.0)
MCV: 82.9 fL (ref 80.0–100.0)
Platelets: 347 10*3/uL (ref 150–400)
Platelets: 216 10*3/uL (ref 150–400)
Platelets: 225 10*3/uL (ref 150–400)
RBC: 2.43 MIL/uL — ABNORMAL LOW (ref 3.87–5.11)
RBC: 4.4 MIL/uL (ref 3.87–5.11)
RBC: 4.55 MIL/uL (ref 3.87–5.11)
RDW: 19.4 % — ABNORMAL HIGH (ref 11.5–15.5)
RDW: 17.8 % — ABNORMAL HIGH (ref 11.5–15.5)
RDW: 18 % — ABNORMAL HIGH (ref 11.5–15.5)
WBC: 13.9 10*3/uL — ABNORMAL HIGH (ref 4.0–10.5)
WBC: 6.5 10*3/uL (ref 4.0–10.5)
WBC: 10.5 10*3/uL (ref 4.0–10.5)
nRBC: 1.4 % — ABNORMAL HIGH (ref 0.0–0.2)
nRBC: 0.6 % — ABNORMAL HIGH (ref 0.0–0.2)
nRBC: 0.6 % — ABNORMAL HIGH (ref 0.0–0.2)

## 2022-12-13 LAB — BASIC METABOLIC PANEL
Anion gap: 12 (ref 5–15)
BUN: 26 mg/dL — ABNORMAL HIGH (ref 6–20)
CO2: 16 mmol/L — ABNORMAL LOW (ref 22–32)
Calcium: 8 mg/dL — ABNORMAL LOW (ref 8.9–10.3)
Chloride: 106 mmol/L (ref 98–111)
Creatinine, Ser: 1.92 mg/dL — ABNORMAL HIGH (ref 0.44–1.00)
GFR, Estimated: 32 mL/min — ABNORMAL LOW (ref >60)
Glucose, Bld: 116 mg/dL — ABNORMAL HIGH (ref 70–99)
Potassium: 4.7 mmol/L (ref 3.5–5.1)
Sodium: 134 mmol/L — ABNORMAL LOW (ref 135–145)

## 2022-12-13 LAB — BLOOD GAS, ARTERIAL
Acid-base deficit: 13.8 mmol/L — ABNORMAL HIGH (ref 0.0–2.0)
Acid-base deficit: 7.5 mmol/L — ABNORMAL HIGH (ref 0.0–2.0)
Bicarbonate: 14.3 mmol/L — ABNORMAL LOW (ref 20.0–28.0)
Bicarbonate: 18.2 mmol/L — ABNORMAL LOW (ref 20.0–28.0)
Drawn by: 560031
FIO2: 100 %
FIO2: 50 %
MECHVT: 440 mL
MECHVT: 440 mL
O2 Saturation: 97.6 %
O2 Saturation: 94.4 %
PEEP: 5 cmH2O
PEEP: 5 cmH2O
Patient temperature: 34.4
Patient temperature: 37.2
RATE: 16 {breaths}/min
RATE: 16 {breaths}/min
pCO2 arterial: 38 mmHg (ref 32–48)
pCO2 arterial: 37 mmHg (ref 32–48)
pH, Arterial: 7.16 — CL (ref 7.35–7.45)
pH, Arterial: 7.3 — ABNORMAL LOW (ref 7.35–7.45)
pO2, Arterial: 369 mmHg — ABNORMAL HIGH (ref 83–108)
pO2, Arterial: 79 mmHg — ABNORMAL LOW (ref 83–108)

## 2022-12-13 LAB — I-STAT CG4 LACTIC ACID, ED: Lactic Acid, Venous: 12 mmol/L (ref 0.5–1.9)

## 2022-12-13 LAB — COMPREHENSIVE METABOLIC PANEL
ALT: 77 U/L — ABNORMAL HIGH (ref 0–44)
AST: 224 U/L — ABNORMAL HIGH (ref 15–41)
Albumin: <1.5 g/dL — ABNORMAL LOW (ref 3.5–5.0)
Alkaline Phosphatase: 77 U/L (ref 38–126)
Anion gap: 15 (ref 5–15)
BUN: 21 mg/dL — ABNORMAL HIGH (ref 6–20)
CO2: 14 mmol/L — ABNORMAL LOW (ref 22–32)
Calcium: 10.2 mg/dL (ref 8.9–10.3)
Chloride: 108 mmol/L (ref 98–111)
Creatinine, Ser: 1.64 mg/dL — ABNORMAL HIGH (ref 0.44–1.00)
GFR, Estimated: 39 mL/min — ABNORMAL LOW (ref >60)
Glucose, Bld: 232 mg/dL — ABNORMAL HIGH (ref 70–99)
Potassium: 4.3 mmol/L (ref 3.5–5.1)
Sodium: 137 mmol/L (ref 135–145)
Total Bilirubin: 0.3 mg/dL (ref 0.3–1.2)
Total Protein: 3.3 g/dL — ABNORMAL LOW (ref 6.5–8.1)

## 2022-12-13 LAB — ECHOCARDIOGRAM LIMITED
Height: 64 in
S' Lateral: 2.3 cm

## 2022-12-13 LAB — MAGNESIUM: Magnesium: 4.7 mg/dL — ABNORMAL HIGH (ref 1.7–2.4)

## 2022-12-13 LAB — CBG MONITORING, ED: Glucose-Capillary: 168 mg/dL — ABNORMAL HIGH (ref 70–99)

## 2022-12-13 LAB — HEMOGLOBIN A1C
Hgb A1c MFr Bld: 5.7 % — ABNORMAL HIGH (ref 4.8–5.6)
Mean Plasma Glucose: 116.89 mg/dL

## 2022-12-13 LAB — PREPARE RBC (CROSSMATCH)

## 2022-12-13 LAB — C-REACTIVE PROTEIN: CRP: 29.7 mg/dL — ABNORMAL HIGH (ref <1.0)

## 2022-12-13 LAB — LACTIC ACID, PLASMA
Lactic Acid, Venous: >9 mmol/L (ref 0.5–1.9)
Lactic Acid, Venous: 6.2 mmol/L (ref 0.5–1.9)
Lactic Acid, Venous: 3.3 mmol/L (ref 0.5–1.9)

## 2022-12-13 LAB — SEDIMENTATION RATE: Sed Rate: 6 mm/h (ref 0–22)

## 2022-12-13 LAB — TROPONIN I (HIGH SENSITIVITY)
Troponin I (High Sensitivity): 5 ng/L (ref <18)
Troponin I (High Sensitivity): 27 ng/L — ABNORMAL HIGH (ref <18)

## 2022-12-13 LAB — MRSA NEXT GEN BY PCR, NASAL: MRSA by PCR Next Gen: NOT DETECTED

## 2022-12-13 LAB — ABO/RH: ABO/RH(D): B POS

## 2022-12-13 MED ORDER — FENTANYL CITRATE PF 50 MCG/ML IJ SOSY
50.0000 ug | PREFILLED_SYRINGE | INTRAMUSCULAR | Status: AC | PRN
  Administered 2022-12-13 – 2022-12-19 (×3): 50 ug via INTRAVENOUS
  Filled 2022-12-13 (×3): qty 1

## 2022-12-13 MED ORDER — ORAL CARE MOUTH RINSE
15.0000 mL | OROMUCOSAL | Status: DC
  Administered 2022-12-13 – 2022-12-19 (×68): 15 mL via OROMUCOSAL

## 2022-12-13 MED ORDER — FENTANYL CITRATE PF 50 MCG/ML IJ SOSY
50.0000 ug | PREFILLED_SYRINGE | INTRAMUSCULAR | Status: DC | PRN
  Administered 2022-12-13: 200 ug via INTRAVENOUS
  Administered 2022-12-14 (×3): 100 ug via INTRAVENOUS
  Administered 2022-12-15: 50 ug via INTRAVENOUS
  Administered 2022-12-17: 100 ug via INTRAVENOUS
  Administered 2022-12-18 (×3): 50 ug via INTRAVENOUS
  Filled 2022-12-13: qty 1
  Filled 2022-12-13 (×2): qty 2
  Filled 2022-12-13: qty 1
  Filled 2022-12-13: qty 2
  Filled 2022-12-13: qty 4
  Filled 2022-12-13: qty 2
  Filled 2022-12-13: qty 1
  Filled 2022-12-13 (×2): qty 2

## 2022-12-13 MED ORDER — SODIUM BICARBONATE 8.4 % IV SOLN
INTRAVENOUS | Status: AC | PRN
  Administered 2022-12-13: 50 meq via INTRAVENOUS

## 2022-12-13 MED ORDER — CHLORHEXIDINE GLUCONATE CLOTH 2 % EX PADS
6.0000 | MEDICATED_PAD | Freq: Every day | CUTANEOUS | Status: DC
  Administered 2022-12-13 – 2022-12-28 (×14): 6 via TOPICAL

## 2022-12-13 MED ORDER — ONDANSETRON HCL 4 MG PO TABS: 4.0000 mg | ORAL_TABLET | Freq: Four times a day (QID) | ORAL | Status: DC | PRN

## 2022-12-13 MED ORDER — MAGNESIUM SULFATE 50 % IJ SOLN
INTRAMUSCULAR | Status: AC | PRN
Start: 2022-12-13 — End: 2022-12-13
  Administered 2022-12-13: 2 g via INTRAVENOUS

## 2022-12-13 MED ORDER — MELATONIN 5 MG PO TABS: 10.0000 mg | ORAL_TABLET | Freq: Every evening | ORAL | Status: DC | PRN

## 2022-12-13 MED ORDER — NOREPINEPHRINE 4 MG/250ML-% IV SOLN
0.0000 ug/min | INTRAVENOUS | Status: DC
  Administered 2022-12-13: 18 ug/min via INTRAVENOUS
  Administered 2022-12-13: 19 ug/min via INTRAVENOUS
  Administered 2022-12-14 (×3): 28 ug/min via INTRAVENOUS
  Filled 2022-12-13 (×5): qty 250

## 2022-12-13 MED ORDER — VASOPRESSIN 20 UNITS/100 ML INFUSION FOR SHOCK
0.0400 [IU]/min | INTRAVENOUS | Status: DC
  Administered 2022-12-13 – 2022-12-15 (×5): 0.04 [IU]/min via INTRAVENOUS
  Filled 2022-12-13 (×5): qty 100

## 2022-12-13 MED ORDER — INSULIN ASPART 100 UNIT/ML IJ SOLN
0.0000 [IU] | INTRAMUSCULAR | Status: DC
  Administered 2022-12-13: 1 [IU] via SUBCUTANEOUS
  Administered 2022-12-13: 2 [IU] via SUBCUTANEOUS
  Administered 2022-12-14 – 2022-12-15 (×3): 1 [IU] via SUBCUTANEOUS
  Administered 2022-12-15: 2 [IU] via SUBCUTANEOUS
  Administered 2022-12-15 – 2022-12-16 (×4): 1 [IU] via SUBCUTANEOUS
  Administered 2022-12-16: 2 [IU] via SUBCUTANEOUS
  Administered 2022-12-16 – 2022-12-17 (×8): 1 [IU] via SUBCUTANEOUS
  Administered 2022-12-18: 2 [IU] via SUBCUTANEOUS
  Administered 2022-12-18: 1 [IU] via SUBCUTANEOUS
  Administered 2022-12-18: 2 [IU] via SUBCUTANEOUS
  Filled 2022-12-13: qty 0.09

## 2022-12-13 MED ORDER — NOREPINEPHRINE 4 MG/250ML-% IV SOLN
INTRAVENOUS | Status: AC
  Administered 2022-12-13: 4 mg via INTRAVENOUS
  Filled 2022-12-13: qty 250

## 2022-12-13 MED ORDER — PANTOPRAZOLE SODIUM 40 MG IV SOLR
40.0000 mg | INTRAVENOUS | Status: DC
  Administered 2022-12-13 – 2022-12-23 (×11): 40 mg via INTRAVENOUS
  Filled 2022-12-13 (×11): qty 10

## 2022-12-13 MED ORDER — MIDAZOLAM HCL 2 MG/2ML IJ SOLN
2.0000 mg | Freq: Once | INTRAMUSCULAR | Status: AC
  Administered 2022-12-13: 2 mg via INTRAVENOUS
  Filled 2022-12-13: qty 2

## 2022-12-13 MED ORDER — MIDAZOLAM HCL 2 MG/2ML IJ SOLN
INTRAMUSCULAR | Status: AC
  Filled 2022-12-13: qty 2

## 2022-12-13 MED ORDER — CALCIUM CHLORIDE 10 % IV SOLN
INTRAVENOUS | Status: AC | PRN
Start: 2022-12-13 — End: 2022-12-13
  Administered 2022-12-13: 1 g via INTRAVENOUS

## 2022-12-13 MED ORDER — MORPHINE SULFATE (PF) 4 MG/ML IV SOLN
4.0000 mg | INTRAVENOUS | Status: DC | PRN
  Administered 2022-12-13: 4 mg via INTRAVENOUS
  Filled 2022-12-13: qty 1

## 2022-12-13 MED ORDER — ROCURONIUM BROMIDE 10 MG/ML (PF) SYRINGE
50.0000 mg | PREFILLED_SYRINGE | Freq: Once | INTRAVENOUS | Status: AC
  Administered 2022-12-13: 50 mg via INTRAVENOUS
  Filled 2022-12-13: qty 10

## 2022-12-13 MED ORDER — SODIUM CHLORIDE (PF) 0.9 % IJ SOLN
INTRAMUSCULAR | Status: AC
  Filled 2022-12-13: qty 50

## 2022-12-13 MED ORDER — ACETAMINOPHEN 325 MG PO TABS: 650.0000 mg | ORAL_TABLET | Freq: Four times a day (QID) | ORAL | Status: DC | PRN

## 2022-12-13 MED ORDER — DEXMEDETOMIDINE HCL IN NACL 400 MCG/100ML IV SOLN
0.0000 ug/kg/h | INTRAVENOUS | Status: DC
  Administered 2022-12-13 – 2022-12-16 (×5): 0.4 ug/kg/h via INTRAVENOUS
  Administered 2022-12-17: 0.3 ug/kg/h via INTRAVENOUS
  Filled 2022-12-13 (×6): qty 100

## 2022-12-13 MED ORDER — MIDAZOLAM-SODIUM CHLORIDE 100-0.9 MG/100ML-% IV SOLN
0.5000 mg/h | INTRAVENOUS | Status: DC
  Administered 2022-12-13: 0.5 mg/h via INTRAVENOUS
  Administered 2022-12-15: 2 mg/h via INTRAVENOUS
  Administered 2022-12-17: 1 mg/h via INTRAVENOUS
  Filled 2022-12-13 (×3): qty 100

## 2022-12-13 MED ORDER — METHYLPREDNISOLONE SODIUM SUCC 125 MG IJ SOLR
60.0000 mg | INTRAMUSCULAR | Status: DC
  Administered 2022-12-14: 60 mg via INTRAVENOUS
  Filled 2022-12-13 (×2): qty 2

## 2022-12-13 MED ORDER — SODIUM CHLORIDE 0.9% IV SOLUTION: Freq: Once | INTRAVENOUS | Status: AC

## 2022-12-13 MED ORDER — ORAL CARE MOUTH RINSE: 15.0000 mL | OROMUCOSAL | Status: DC | PRN

## 2022-12-13 MED ORDER — EPINEPHRINE 1 MG/10ML IJ SOSY
PREFILLED_SYRINGE | INTRAMUSCULAR | Status: AC | PRN
Start: 2022-12-13 — End: 2022-12-13
  Administered 2022-12-13 (×3): 1 mg via INTRAVENOUS

## 2022-12-13 MED ORDER — IOHEXOL 300 MG/ML  SOLN: 100.0000 mL | Freq: Once | INTRAMUSCULAR | Status: DC | PRN

## 2022-12-13 MED ORDER — SUCCINYLCHOLINE CHLORIDE 200 MG/10ML IV SOSY
PREFILLED_SYRINGE | INTRAVENOUS | Status: AC
  Filled 2022-12-13: qty 10

## 2022-12-13 MED ORDER — PIPERACILLIN-TAZOBACTAM 3.375 G IVPB
3.3750 g | Freq: Three times a day (TID) | INTRAVENOUS | Status: DC
  Administered 2022-12-13 – 2022-12-20 (×22): 3.375 g via INTRAVENOUS
  Filled 2022-12-13 (×22): qty 50

## 2022-12-13 MED ORDER — CHLORHEXIDINE GLUCONATE CLOTH 2 % EX PADS
6.0000 | MEDICATED_PAD | Freq: Every day | CUTANEOUS | Status: DC
  Administered 2022-12-13: 6 via TOPICAL

## 2022-12-13 MED ORDER — KETAMINE HCL 50 MG/5ML IJ SOSY
PREFILLED_SYRINGE | INTRAMUSCULAR | Status: AC
  Filled 2022-12-13: qty 10

## 2022-12-13 MED ORDER — HEPARIN SODIUM (PORCINE) 5000 UNIT/ML IJ SOLN
5000.0000 [IU] | Freq: Three times a day (TID) | INTRAMUSCULAR | Status: DC
  Filled 2022-12-13: qty 1

## 2022-12-13 MED ORDER — ACETAMINOPHEN 650 MG RE SUPP
650.0000 mg | Freq: Four times a day (QID) | RECTAL | Status: DC | PRN
  Administered 2022-12-13: 650 mg via RECTAL
  Filled 2022-12-13: qty 1

## 2022-12-13 MED ORDER — FENTANYL CITRATE PF 50 MCG/ML IJ SOSY
PREFILLED_SYRINGE | INTRAMUSCULAR | Status: AC
  Filled 2022-12-13: qty 2

## 2022-12-13 MED ORDER — ONDANSETRON HCL 4 MG/2ML IJ SOLN
4.0000 mg | Freq: Four times a day (QID) | INTRAMUSCULAR | Status: DC | PRN
  Administered 2023-01-07 – 2023-01-15 (×5): 4 mg via INTRAVENOUS
  Filled 2022-12-13 (×5): qty 2

## 2022-12-13 MED ORDER — MORPHINE SULFATE (PF) 4 MG/ML IV SOLN
4.0000 mg | Freq: Once | INTRAVENOUS | Status: AC
  Administered 2022-12-13: 4 mg via INTRAVENOUS
  Filled 2022-12-13: qty 1

## 2022-12-13 MED ORDER — ROCURONIUM BROMIDE 10 MG/ML (PF) SYRINGE
PREFILLED_SYRINGE | INTRAVENOUS | Status: AC
  Filled 2022-12-13: qty 10

## 2022-12-13 MED FILL — Medication: Qty: 1 | Status: AC

## 2022-12-13 NOTE — ED Provider Notes (Signed)
  Physical Exam  BP (!) 150/88   Pulse (!) 102   Temp 98.7 F (37.1 C) (Oral)   Resp 19   LMP 04/24/2021   SpO2 100%   Physical Exam  Procedures  Date/Time: 12/13/2022 8:15 AM  Performed by: Benjiman Core, MDOxygen Delivery Method: Ambu bag Preoxygenation: Pre-oxygenation with 100% oxygen Ventilation: Mask ventilation with difficulty Laryngoscope Size: Glidescope and 3 Grade View: Grade II Tube type: Subglottic suction tube Tube size: 7.5 mm Number of attempts: 1 Placement Confirmation: ETT inserted through vocal cords under direct vision, CO2 detector and Breath sounds checked- equal and bilateral Tube secured with: ETT holder      ED Course / MDM    Medical Decision Making Amount and/or Complexity of Data Reviewed Labs: ordered. Radiology: ordered.  Risk Prescription drug management. Decision regarding hospitalization.   Called and emergent to see patient.  Found on the floor in the bathroom after going to the bathroom.  Potential downtime of 5 minutes but did not have a pulse.  Transfer the bathroom to recess to be.  CPR had been continuing along the way.  Found to have potentially a torsades on rhythm check.  No pulse.  Defibrillated and magnesium given.  Had been given epinephrine also.  Intubated.  Somewhat difficult intubation more due to patient positioning but did have some vomitus apparent in the airway.  Defibrillated again since no pulse and is still in a V-fib.  After pulse check did have return of pulses.  I-STAT drawn off accessed port, however hemoglobin came back at 5.5.  Has not been this low.  Bleeding dropped and unsure if this could be diluted.  Will recheck. Patient was biting on the tube.  Rocuronium given.  Discussed with Dr. Natale Milch and Dr. Katrinka Blazing.  Cardiopulmonary Resuscitation (CPR) Procedure Note Directed/Performed by: Benjiman Core I personally directed ancillary staff and/or performed CPR in an effort to regain return of  spontaneous circulation and to maintain cardiac, neuro and systemic perfusion.           Benjiman Core, MD 12/13/22 [phone]

## 2022-12-13 NOTE — Progress Notes (Signed)
Signout given to evening and tomorrow's team:  "Heads up on this lady: here with Crohn's with known stricture and chronic SBO-type symptoms here with recurrence, arrested briefly in ER with Hgb drop. Abd has remained rigid since. She did wake up/ follow commands, and lactate cleared a bit with blood products. Bladder pressure this evening is 28, pressor needs up slightly, repeat lactate pending.   She has no clear pneumatosis yet on imaging but if starts heading in wrong direction may need emergent transfer to [facility] given limited OR resources at night at [place]. In AM, really needs push for GI and/or surgery to do something to help relieve the obstruction. Minimal UOP. Pressor needs minor (for now). On side note, some RV dilation called on echo, I think (A) this is an overread and (B) if any reduction in function its related to aspiration event during code. Getting LE duplex but not starting AC at this time given large H/H drop. "  Myrla Halsted MD PCCM

## 2022-12-13 NOTE — Progress Notes (Signed)
Attempted Echo. Patient getting an arterial line put in. Will attempted Echo again today.  Dondra Prader RVT RCS

## 2022-12-13 NOTE — Progress Notes (Signed)
RT NOTE:  Pt ETT pulled back 2 cm per CCMD, Smith. Originally at 22 at the lip now at 20 at the lip.

## 2022-12-13 NOTE — ED Notes (Signed)
Pt refused IV steroids. Dr. Freida Busman notified. Comment documented in [facility].

## 2022-12-13 NOTE — H&P (Signed)
History and Physical    [name] YSA:[id number] DOB: [birth year] DOA: 12/12/2022  DOS: the patient was seen and examined on 12/12/2022  PCP: Ollen Bowl, MD   Patient coming from: Home  I have personally briefly reviewed patient's old medical records in [facility]  CC: abd pain HPI: 47 year old African-American female history of Crohn's disease status post subtotal  colectomy with ileosigmoid anastomosis, history of breast cancer status post bilateral mastectomy moderate malnutrition who presents to the ER with recurrent abdominal pain.  She was discharged from the hospital on 11/23/2022 after spending 5 days in the hospital for abdominal pain.  During that time she had a flexible sigmoidoscopy which showed that she had a severe stenosis in the ileosigmoid anastomosis.  This was consistent with active Crohn's disease.  She been placed on IV steroids and changed over to p.o. prednisone.  She followed up in the GI office and was started on Entyvio.  She received her first infusion of Entyvio on 12/06/2022.  She has been tapering her prednisone as instructed.  She is down to 20 mg a day.  She noted that when she started tapering her prednisone, when she got to 30 mg she started noticing intermittent abdominal pain.  She tapered down to 20 mg of prednisone this past Saturday on August 31.  She noticed that she had daily abdominal pain since tapering to 20 mg a day.  Today, she woke up started having abdominal pain.  She ate a banana and some water thinking that this would help.  She has been taking her prednisone with food in the morning.  By the afternoon, her abdominal pain became so severe that she needed to call EMS.  She denies any diarrhea.  She has had nausea but no vomiting.  No fever.  No chills.  She states that prednisone makes her feel terrible.  She has difficulty concentrating at work.  She notices that she is very irritable.  She has difficulty with insomnia when she  is on prednisone.  On arrival to the ER, temp 98.7 heart rate 114 blood pressure 131/101 98% room air saturations  White count 10.8, hemoglobin 10.7, platelets of 505  Sodium 134, potassium 3.2, chloride 102, bicarb of 19, BUN of 13, creatinine 0.74, glucose 115 Calcium 7.7, total protein 6.5, BUN 2.3, AST of 16, ALT of 17 alk phos of 138, total bili 0.8  Lipase 19  CT abdomen pelvis showed dilated distal small bowel near the anastomosis and bowel wall thickening consistent with active Crohn's  EDP has messaged GI for consultation  Patient given IV fluids and IV morphine.  [facility] consulted     ED Course: WBC 10, BUN 13, Scr .074, K 3.2  Review of Systems:  Review of Systems  Constitutional: Negative.   HENT: Negative.    Eyes: Negative.   Respiratory: Negative.    Cardiovascular: Negative.   Gastrointestinal:  Positive for abdominal pain and nausea. Negative for blood in stool, constipation and vomiting.  Genitourinary: Negative.   Musculoskeletal: Negative.   Skin: Negative.   Neurological: Negative.   Endo/Heme/Allergies: Negative.   Psychiatric/Behavioral:  Positive for depression. The patient is nervous/anxious.        Insomnia when on steroids Difficulty concentrating when on steroids  All other systems reviewed and are negative.   Past Medical History:  Diagnosis Date   Breast cancer [facility])    right   Colon stricture (HCC) 05/07/2019   Crohn's colitis, with intestinal obstruction (  HCC) 09/17/2013   Dx 2006 - right and left colon involved 2011 - pan-colitis 09/17/2013 left colitis 60-20 cm March 2021 subtotal colectomy ileosigmoid anastomosis for colonic strictures-Dr. Maisie Fus     Crohn's disease [facility])    Family history of breast cancer 06/11/2020   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 10/17/2010   Rectal bleeding    Uterine fibroid    Vitamin D deficiency 09/24/2013    Past Surgical History:  Procedure  Laterality Date   AXILLARY LYMPH NODE DISSECTION Right 11/09/2020   Procedure: RIGHT AXILLARY LYMPH NODE DISSECTION;  Surgeon: Emelia Loron, MD;  Location: [facility];  Service: General;  Laterality: Right;   BIOPSY  07/01/2020   Procedure: BIOPSY;  Surgeon: Benancio Deeds, MD;  Location: [facility];  Service: Gastroenterology;;   BIOPSY  01/27/2021   Procedure: BIOPSY;  Surgeon: Benancio Deeds, MD;  Location: [facility];  Service: Gastroenterology;;   BIOPSY  09/07/2021   Procedure: BIOPSY;  Surgeon: Lynann Bologna, MD;  Location: [facility];  Service: Gastroenterology;;   BIOPSY  11/21/2022   Procedure: BIOPSY;  Surgeon: Lemar Lofty., MD;  Location: [facility];  Service: Gastroenterology;;   BREAST BIOPSY Right 03/24/2022   [place] RT BREAST BX W LOC DEV 1ST LESION IMG BX SPEC US GUIDE 03/24/2022 [facility]   BREAST BIOPSY  04/28/2022   [place] RT RADIOACTIVE SEED LOC 04/28/2022 [facility]   BREAST IMPLANT REMOVAL Right 08/07/2022   Procedure: REMOVAL  OF RIGHT BREAST IMPLANTS;  Surgeon: Allena Napoleon, MD;  Location: [facility];  Service: Plastics;  Laterality: Right;   BREAST RECONSTRUCTION WITH PLACEMENT OF TISSUE EXPANDER AND FLEX HD (ACELLULAR HYDRATED DERMIS) Bilateral 11/09/2020   Procedure: BILATERAL BREAST RECONSTRUCTION WITH PLACEMENT OF TISSUE EXPANDER AND FLEX HD (ACELLULAR HYDRATED DERMIS);  Surgeon: Allena Napoleon, MD;  Location: [facility];  Service: Plastics;  Laterality: Bilateral;   CAPSULOTOMY Bilateral 07/25/2021   Procedure: CAPSULOTOMY;  Surgeon: Allena Napoleon, MD;  Location: [facility];  Service: Plastics;  Laterality: Bilateral;   COLECTOMY  06/27/2019   COLONOSCOPY  2015   ESOPHAGOGASTRODUODENOSCOPY (EGD) WITH PROPOFOL N/A 11/21/2022   Procedure: ESOPHAGOGASTRODUODENOSCOPY (EGD) WITH PROPOFOL;  Surgeon: Lemar Lofty., MD;  Location: [facility];  Service:  Gastroenterology;  Laterality: N/A;   FLEXIBLE SIGMOIDOSCOPY N/A 07/01/2020   Procedure: FLEXIBLE SIGMOIDOSCOPY;  Surgeon: Benancio Deeds, MD;  Location: [facility];  Service: Gastroenterology;  Laterality: N/A;   FLEXIBLE SIGMOIDOSCOPY N/A 01/27/2021   Procedure: FLEXIBLE SIGMOIDOSCOPY;  Surgeon: Benancio Deeds, MD;  Location: [facility];  Service: Gastroenterology;  Laterality: N/A;   FLEXIBLE SIGMOIDOSCOPY N/A 09/07/2021   Procedure: FLEXIBLE SIGMOIDOSCOPY;  Surgeon: Lynann Bologna, MD;  Location: [facility];  Service: Gastroenterology;  Laterality: N/A;   FLEXIBLE SIGMOIDOSCOPY N/A 11/21/2022   Procedure: FLEXIBLE SIGMOIDOSCOPY;  Surgeon: Meridee Score Netty Starring., MD;  Location: [facility];  Service: Gastroenterology;  Laterality: N/A;   NIPPLE SPARING MASTECTOMY Bilateral 11/09/2020   Procedure: BILATERAL NIPPLE SPARING MASTECTOMY;  Surgeon: Emelia Loron, MD;  Location: [facility];  Service: General;  Laterality: Bilateral;   PORT-A-CATH REMOVAL N/A 07/25/2021   Procedure: REMOVAL PORT-A-CATH;  Surgeon: Allena Napoleon, MD;  Location: [facility];  Service: Plastics;  Laterality: N/A;   PORTACATH PLACEMENT N/A 06/23/2020   Procedure: INSERTION PORT-A-CATH;  Surgeon: Emelia Loron, MD;  Location: [facility];  Service: General;  Laterality: N/A;  START  TIME OF 11:00 AM FOR 60 MINUTES [place] [facility]   PORTACATH PLACEMENT Left 05/02/2022   Procedure: INSERTION PORT-A-CATH;  Surgeon: Emelia Loron, MD;  Location: [facility];  Service: General;  Laterality: Left;   RADIOACTIVE SEED GUIDED EXCISIONAL BREAST BIOPSY Right 05/02/2022   Procedure: RADIOACTIVE SEED GUIDED RIGHT BREAST MASS EXCISION;  Surgeon: Emelia Loron, MD;  Location: [facility];  Service: General;  Laterality: Right;   REMOVAL OF BILATERAL TISSUE EXPANDERS WITH PLACEMENT OF BILATERAL BREAST IMPLANTS Bilateral 07/25/2021   Procedure:  REMOVAL OF BILATERAL TISSUE EXPANDERS WITH PLACEMENT OF BILATERAL BREAST IMPLANTS;  Surgeon: Allena Napoleon, MD;  Location: [facility];  Service: Plastics;  Laterality: Bilateral;  1.5     reports that she has quit smoking. Her smoking use included cigarettes. She has never used smokeless tobacco. She reports current alcohol use of about 1.0 - 2.0 standard drink of alcohol per week. She reports that she does not use drugs.  Allergies  Allergen Reactions   Nsaids Other (See Comments)    Crohns disease/ IBD   Other     BLOOD PRODUCT REFUSAL    Family History  Problem Relation Age of Onset   Diabetes Father    Breast cancer Maternal Aunt        dx unknown age   Diabetes Paternal Aunt    Breast cancer Cousin        maternal cousin; dx unknown age   Esophageal cancer Neg Hx    Rectal cancer Neg Hx    Stomach cancer Neg Hx    Colon polyps Neg Hx    Colon cancer Neg Hx     Prior to Admission medications   Medication Sig Start Date End Date Taking? Authorizing Provider  acetaminophen (TYLENOL) 500 MG tablet Take 500-1,000 mg by mouth every 6 (six) hours as needed for mild pain.   Yes [provider]  cholecalciferol (VITAMIN D3) 25 MCG (1000 UNIT) tablet Take 1,000 Units by mouth daily.   Yes [provider]  hyoscyamine (LEVSIN SL) 0.125 MG SL tablet Place 1 tablet (0.125 mg total) under the tongue every 6 (six) hours as needed for cramping. 11/23/22  Yes Standley Brooking, MD  Multiple Vitamins-Minerals (MULTIVITAMIN WITH MINERALS) tablet Take 1 tablet by mouth daily.   Yes [provider]  nystatin (MYCOSTATIN) 100000 UNIT/ML suspension Take 5 mLs (500,000 Units total) by mouth 4 (four) times daily. 11/23/22  Yes Standley Brooking, MD  potassium chloride SA (KLOR-CON M) 20 MEQ tablet Take 1 tablet (20 mEq total) by mouth daily. 12/27/21  Yes Iva Boop, MD  predniSONE (DELTASONE) 20 MG tablet Take 2 tablets (40 mg total) by mouth daily  with breakfast for 7 days, THEN 1.5 tablets (30 mg total) daily with breakfast for 7 days, THEN 1 tablet (20 mg total) daily with breakfast for 21 days. 11/24/22 12/29/22 Yes Standley Brooking, MD  simethicone (MYLICON) 80 MG chewable tablet Chew 1 tablet (80 mg total) by mouth 4 (four) times daily as needed for flatulence. 11/23/22  Yes Standley Brooking, MD    Physical Exam: Vitals:   12/13/22 0100 12/13/22 0130 12/13/22 0200 12/13/22 0230  BP: (!) 121/93 (!) 118/98 (!) 132/104 130/84  Pulse: (!) 125 (!) 136 (!) 127 (!) 130  Resp: (!) 26 19 18  (!) 22  Temp:    98.7 F (37.1 C)  TempSrc:    Oral  SpO2: 96% 99% 96% 98%  Physical Exam Vitals and nursing note reviewed.  Constitutional:      General: She is not in acute distress.    Appearance: She is not toxic-appearing or diaphoretic.     Comments: Thin, chronically ill  HENT:     Head: Normocephalic and atraumatic.     Nose: Nose normal.  Eyes:     General: No scleral icterus. Cardiovascular:     Rate and Rhythm: Regular rhythm. Tachycardia present.     Pulses: Normal pulses.  Pulmonary:     Effort: Pulmonary effort is normal.     Breath sounds: Normal breath sounds.  Abdominal:     General: Bowel sounds are normal.     Tenderness: There is abdominal tenderness in the epigastric area. There is no rebound.  Musculoskeletal:     Right lower leg: No edema.     Left lower leg: No edema.  Skin:    General: Skin is warm and dry.     Capillary Refill: Capillary refill takes less than 2 seconds.  Neurological:     General: No focal deficit present.     Mental Status: She is alert and oriented to person, place, and time.      Labs on Admission: I have personally reviewed following labs and imaging studies  CBC: Recent Labs  Lab 12/12/22 2130  WBC 10.8*  NEUTROABS 8.1*  HGB 10.7*  HCT 34.4*  MCV 82.5  PLT 505*   Basic Metabolic Panel: Recent Labs  Lab 12/12/22 2130  NA 134*  K 3.2*  CL 102  CO2 19*  GLUCOSE  115*  BUN 13  CREATININE 0.74  CALCIUM 7.7*   GFR: Estimated Creatinine Clearance: 64.9 mL/min (by C-G formula based on SCr of 0.74 mg/dL). Liver Function Tests: Recent Labs  Lab 12/12/22 2130  AST 16  ALT 17  ALKPHOS 138*  BILITOT 0.8  PROT 6.5  ALBUMIN 2.3*   Recent Labs  Lab 12/12/22 2130  LIPASE 19   Radiological Exams on Admission: I have personally reviewed images CT ABDOMEN PELVIS WO CONTRAST  Result Date: 12/12/2022 CLINICAL DATA:  Acute nonlocalized abdominal pain. History of Crohn disease. Nausea, vomiting, and diarrhea. EXAM: CT ABDOMEN AND PELVIS WITHOUT CONTRAST TECHNIQUE: Multidetector CT imaging of the abdomen and pelvis was performed following the standard protocol without IV contrast. RADIATION DOSE REDUCTION: This exam was performed according to the departmental dose-optimization program which includes automated exposure control, adjustment of the mA and/or kV according to patient size and/or use of iterative reconstruction technique. COMPARISON:  11/18/2022 FINDINGS: Lower chest: Lung bases are clear. Left breast implant. Presumed right mastectomy. Hepatobiliary: No focal liver abnormality is seen. No gallstones, gallbladder wall thickening, or biliary dilatation. Pancreas: Unremarkable. No pancreatic ductal dilatation or surrounding inflammatory changes. Spleen: Normal in size without focal abnormality. Adrenals/Urinary Tract: Adrenal glands are unremarkable. Kidneys are normal, without renal calculi, focal lesion, or hydronephrosis. Bladder is unremarkable. Stomach/Bowel: Postoperative subtotal colectomy with surgical anastomosis in the left lower quadrant. There are markedly dilated distal loops of small bowel with air-fluid levels and diffusely thickened wall. This could indicate chronic obstruction and possibly indicates anastomotic or inflammatory stricture. Appearance is similar to prior study. Remainder of the small bowel is decompressed. Vascular/Lymphatic: No  significant vascular findings are present. No enlarged abdominal or pelvic lymph nodes. Reproductive: Enlarged uterus with myometrial calcifications consistent with fibroids. No abnormal adnexal masses. Other: Small amount of free fluid in the pelvis. No free air. Abdominal wall musculature appears intact. Musculoskeletal: No acute  or significant osseous findings. IMPRESSION: 1. No significant change in appearance since prior examination. 2. Again, there is postoperative change with subtotal colectomy and markedly dilated distal small bowel. Dilated small bowel containing air-fluid levels consistent with liquid stool and have diffuse wall thickening. Changes may represent chronic obstruction possibly due to anastomotic or inflammatory stricture. Bowel wall thickening suggests inflammation consistent with history of Crohn's. No significant change since prior study. 3. Calcified uterine fibroids. 4. Small amount of free fluid in the pelvis is likely reactive. Electronically Signed   By: Burman Nieves M.D.   On: 12/12/2022 22:54    EKG: My personal interpretation of EKG shows: no EKG to review  Assessment/Plan Principal Problem:   Exacerbation of Crohn's disease of large intestine (HCC) Active Problems:   Colonic stricture (HCC)   Malignant neoplasm of upper-outer quadrant of right breast in female, estrogen receptor positive (HCC)   Hypokalemia   Assessment and Plan: * Exacerbation of Crohn's disease of large intestine (HCC) Admit to med/tele bed. Pt had first Entyviio infusion last week. Pt has been tapering on prednisone and currently is on 20 mg. She thinks her abd started hurting when she tapered to 30 mg. Abd hurt worse when she tapered to 20 mg daily prednisone. Pt states that steroids make her irritable, has difficulty concentrating on her job, has difficulty sleeping. CT abd shows persistent inflammation near her colonic anastomosis.  Pt refusing to receive IV steroids. She states that if she  needs to start back again on a higher dose of prednisone(40 mg), then she refuses to take any more IV steroids. EDP has consulted GI. Not sure there is much that can be offered given her anastomotic stenosis with inability to stent it. GI needs to see her and make some decisions with patient on how to proceed. ??does she need colostomy??  Continue with IVF and IV morphine. NPO with ice chips.  Colonic stricture (HCC) Seen on her last flex sig 11-2022.  " There was evidence of a prior end-to-side ileo-colonic anastomosis in the recto-sigmoid colon (approximately at 18 cm). This was patent and was characterized by congestion, erythema, friable mucosa, inflammation, severe stenosis and stenosis 5 mm (inner diameter) x 4 cm (length)."  GI consult consult and comment on next steps to address pt's colonic stenosis.  Hypokalemia Replete with IV kcl. Continue with IV with kcl. Repeat BMP in AM.  Malignant neoplasm of upper-outer quadrant of right breast in female, estrogen receptor positive (HCC) In July 2024, pt told oncology she wants to stop all further breast cancer chemo treatment due to how poorly they have made her feel. Oncology has agreed. Pt currently taking no therapy for her breast cancer.   DVT prophylaxis: SCDs Code Status: Full Code Family Communication: no family at bedside  Disposition Plan: return home  Consults called: EDP has consulted GI  Admission status: Observation, Telemetry bed   Carollee Herter, DO [facility] 12/13/2022, 2:52 AM

## 2022-12-13 NOTE — Consult Note (Signed)
[name] [birth year]  [id number].    Requesting MD: ICU Chief Complaint/Reason for Consult: Arrest, r/o abdominal source  HPI:  47 y/o F w/ a hx of crohn's disease s/p subtotal colectomy with ileosigmoid anastomosis w/ known stricture, metastatic breast cancer not on therapy, and IDA who presented to the ED with abdominal pain, nausea, vomiting, and diarrhea.  A CT on 9/3 showed a dilated SB w/ diffuse thickening c/w known stricture and underlying IBD.  She was admitted to a hospitalist service, but on 9/4 she was found down in the restroom while still in the ED.  ACLS was undertaken and ROSC achieved.  Labs were notable for a Hb < 6, lactate 12, and WBC 14. A repeat CT A/P was performed and she was transferred to the ICU on the ventilator.   ROS: Not obtained due to patient's condition  Family History  Problem Relation Age of Onset   Diabetes Father    Breast cancer Maternal Aunt        dx unknown age   Diabetes Paternal Aunt    Breast cancer Cousin        maternal cousin; dx unknown age   Esophageal cancer Neg Hx    Rectal cancer Neg Hx    Stomach cancer Neg Hx    Colon polyps Neg Hx    Colon cancer Neg Hx     Past Medical History:  Diagnosis Date   Breast cancer (HCC)    right   Colon stricture (HCC) 05/07/2019   Crohn's colitis, with intestinal obstruction (HCC) 09/17/2013   Dx 2006 - right and left colon involved 2011 - pan-colitis 09/17/2013 left colitis 60-20 cm March 2021 subtotal colectomy ileosigmoid anastomosis for colonic strictures-Dr. Maisie Fus     Crohn's disease [facility])    Family history of breast cancer 06/11/2020   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 10/17/2010   Rectal bleeding    Uterine fibroid    Vitamin D deficiency 09/24/2013    Past Surgical History:  Procedure Laterality Date   AXILLARY LYMPH NODE DISSECTION Right 11/09/2020   Procedure: RIGHT AXILLARY LYMPH NODE DISSECTION;  Surgeon: Emelia Loron, MD;  Location: [facility];  Service: General;  Laterality: Right;   BIOPSY  07/01/2020   Procedure: BIOPSY;  Surgeon: Benancio Deeds, MD;  Location: [facility];  Service: Gastroenterology;;   BIOPSY  01/27/2021   Procedure: BIOPSY;  Surgeon: Benancio Deeds, MD;  Location: [facility];  Service: Gastroenterology;;   BIOPSY  09/07/2021   Procedure: BIOPSY;  Surgeon: Lynann Bologna, MD;  Location: [facility];  Service: Gastroenterology;;   BIOPSY  11/21/2022   Procedure: BIOPSY;  Surgeon: Lemar Lofty., MD;  Location: [facility];  Service: Gastroenterology;;   BREAST BIOPSY Right 03/24/2022   [place] RT BREAST BX W LOC DEV 1ST LESION IMG BX SPEC US GUIDE 03/24/2022 [facility]   BREAST BIOPSY  04/28/2022   [place] RT RADIOACTIVE SEED LOC 04/28/2022 [facility]   BREAST IMPLANT REMOVAL Right 08/07/2022   Procedure: REMOVAL  OF RIGHT BREAST IMPLANTS;  Surgeon: Allena Napoleon, MD;  Location: [facility];  Service: Plastics;  Laterality: Right;   BREAST RECONSTRUCTION WITH PLACEMENT OF TISSUE EXPANDER AND FLEX HD (ACELLULAR HYDRATED DERMIS) Bilateral 11/09/2020   Procedure: BILATERAL BREAST RECONSTRUCTION WITH PLACEMENT OF TISSUE EXPANDER AND FLEX HD (ACELLULAR HYDRATED DERMIS);  Surgeon: Allena Napoleon, MD;  Location: [facility];  Service:  Plastics;  Laterality: Bilateral;   CAPSULOTOMY Bilateral 07/25/2021   Procedure: CAPSULOTOMY;  Surgeon: Allena Napoleon, MD;  Location: [facility];  Service: Plastics;  Laterality: Bilateral;   COLECTOMY  06/27/2019   COLONOSCOPY  2015   ESOPHAGOGASTRODUODENOSCOPY (EGD) WITH PROPOFOL N/A 11/21/2022   Procedure: ESOPHAGOGASTRODUODENOSCOPY (EGD) WITH PROPOFOL;  Surgeon: Lemar Lofty., MD;  [facility];  Service: Gastroenterology;  Laterality: N/A;   FLEXIBLE SIGMOIDOSCOPY N/A 07/01/2020   Procedure: FLEXIBLE SIGMOIDOSCOPY;  Surgeon: Benancio Deeds,  MD;  [facility];  Service: Gastroenterology;  Laterality: N/A;   FLEXIBLE SIGMOIDOSCOPY N/A 01/27/2021   Procedure: FLEXIBLE SIGMOIDOSCOPY;  Surgeon: Benancio Deeds, MD;  [facility];  Service: Gastroenterology;  Laterality: N/A;   FLEXIBLE SIGMOIDOSCOPY N/A 09/07/2021   Procedure: FLEXIBLE SIGMOIDOSCOPY;  Surgeon: Lynann Bologna, MD;  Location[facility];  Service: Gastroenterology;  Laterality: N/A;   FLEXIBLE SIGMOIDOSCOPY N/A 11/21/2022   Procedure: FLEXIBLE SIGMOIDOSCOPY;  Surgeon: Meridee Score Netty Starring., MD;  Location: [facility];  Service: Gastroenterology;  Laterality: N/A;   NIPPLE SPARING MASTECTOMY Bilateral 11/09/2020   Procedure: BILATERAL NIPPLE SPARING MASTECTOMY;  Surgeon: Emelia Loron, MD;  Location: [facility];  Service: General;  Laterality: Bilateral;   PORT-A-CATH REMOVAL N/A 07/25/2021   Procedure: REMOVAL PORT-A-CATH;  Surgeon: Allena Napoleon, MD;  Location: [facility];  Service: Plastics;  Laterality: N/A;   PORTACATH PLACEMENT N/A 06/23/2020   Procedure: INSERTION PORT-A-CATH;  Surgeon: Emelia Loron, MD;  Location: [facility];  Service: General;  Laterality: N/A;  START TIME OF 11:00 AM FOR 60 MINUTES [place] [facility]   PORTACATH PLACEMENT Left 05/02/2022   Procedure: INSERTION PORT-A-CATH;  Surgeon: Emelia Loron, MD;  Location: [facility];  Service: General;  Laterality: Left;   RADIOACTIVE SEED GUIDED EXCISIONAL BREAST BIOPSY Right 05/02/2022   Procedure: RADIOACTIVE SEED GUIDED RIGHT BREAST MASS EXCISION;  Surgeon: Emelia Loron, MD;  Location: [facility];  Service: General;  Laterality: Right;   REMOVAL OF BILATERAL TISSUE EXPANDERS WITH PLACEMENT OF BILATERAL BREAST IMPLANTS Bilateral 07/25/2021   Procedure: REMOVAL OF BILATERAL TISSUE EXPANDERS WITH PLACEMENT OF BILATERAL BREAST IMPLANTS;  Surgeon: Allena Napoleon, MD;  Location: [facility];  Service: Plastics;  Laterality: Bilateral;  1.5    Social History:  reports that she has quit smoking. Her smoking use included cigarettes. She has never used smokeless tobacco. She reports current alcohol use of about 1.0 - 2.0 standard drink of alcohol per week. She reports that she does not use drugs.  Allergies:  Allergies  Allergen Reactions   Nsaids Other (See Comments)    Crohns disease/ IBD   Other     BLOOD PRODUCT REFUSAL    Medications Prior to Admission  Medication Sig Dispense Refill   acetaminophen (TYLENOL) 500 MG tablet Take 500-1,000 mg by mouth every 6 (six) hours as needed for mild pain.     cholecalciferol (VITAMIN D3) 25 MCG (1000 UNIT) tablet Take 1,000 Units by mouth daily.     hyoscyamine (LEVSIN SL) 0.125 MG SL tablet Place 1 tablet (0.125 mg total) under the tongue every 6 (six) hours as needed for cramping. 30 tablet 0   Multiple Vitamins-Minerals (MULTIVITAMIN WITH MINERALS) tablet Take 1 tablet by mouth daily.     nystatin (MYCOSTATIN) 100000 UNIT/ML suspension Take 5 mLs (500,000 Units total) by mouth 4 (four) times daily. 473 mL 0   potassium chloride SA (KLOR-CON M) 20 MEQ tablet  Take 1 tablet (20 mEq total) by mouth daily. 90 tablet 2   predniSONE (DELTASONE) 20 MG tablet Take 2 tablets (40 mg total) by mouth daily with breakfast for 7 days, THEN 1.5 tablets (30 mg total) daily with breakfast for 7 days, THEN 1 tablet (20 mg total) daily with breakfast for 21 days. 45.5 tablet 0   simethicone (MYLICON) 80 MG chewable tablet Chew 1 tablet (80 mg total) by mouth 4 (four) times daily as needed for flatulence. 30 tablet 0    Physical Exam: Blood pressure (!) 135/109, pulse (!) 137, temperature (!) 92.8 F (33.8 C), resp. rate 19, last menstrual period 04/24/2021, SpO2 100%. Gen: female, intubated Resp: on the vent, equal chest rise CV: tachy to 140s on monitor Abd: distended, tympanic Neuro: not moving extremities   Results for orders placed or  performed during the hospital encounter of 12/12/22 (from the past 48 hour(s))  Comprehensive metabolic panel     Status: Abnormal   Collection Time: 12/12/22  9:30 PM  Result Value Ref Range   Sodium 134 (L) 135 - 145 mmol/L   Potassium 3.2 (L) 3.5 - 5.1 mmol/L   Chloride 102 98 - 111 mmol/L   CO2 19 (L) 22 - 32 mmol/L   Glucose, Bld 115 (H) 70 - 99 mg/dL    Comment: Glucose reference range applies only to samples taken after fasting for at least 8 hours.   BUN 13 6 - 20 mg/dL   Creatinine, Ser 1.19 0.44 - 1.00 mg/dL   Calcium 7.7 (L) 8.9 - 10.3 mg/dL   Total Protein 6.5 6.5 - 8.1 g/dL   Albumin 2.3 (L) 3.5 - 5.0 g/dL   AST 16 15 - 41 U/L   ALT 17 0 - 44 U/L   Alkaline Phosphatase 138 (H) 38 - 126 U/L   Total Bilirubin 0.8 0.3 - 1.2 mg/dL   GFR, Estimated >14 >78 mL/min    Comment: (NOTE) Calculated using the CKD-EPI Creatinine Equation (2021)    Anion gap 13 5 - 15    Comment: Performed at [facility], [address] [place], [place] [address]  Lipase, blood     Status: None   Collection Time: 12/12/22  9:30 PM  Result Value Ref Range   Lipase 19 11 - 51 U/L    Comment: Performed at [facility], [address] [place], [place] [address]  CBC with Diff     Status: Abnormal   Collection Time: 12/12/22  9:30 PM  Result Value Ref Range   WBC 10.8 (H) 4.0 - 10.5 K/uL   RBC 4.17 3.87 - 5.11 MIL/uL   Hemoglobin 10.7 (L) 12.0 - 15.0 g/dL   HCT 57.8 (L) 46.9 - 62.9 %   MCV 82.5 80.0 - 100.0 fL   MCH 25.7 (L) 26.0 - 34.0 pg   MCHC 31.1 30.0 - 36.0 g/dL   RDW 52.8 (H) 41.3 - 24.4 %   Platelets 505 (H) 150 - 400 K/uL   nRBC 0.2 0.0 - 0.2 %   Neutrophils Relative % 75 %   Neutro Abs 8.1 (H) 1.7 - 7.7 K/uL   Lymphocytes Relative 21 %   Lymphs Abs 2.3 0.7 - 4.0 K/uL   Monocytes Relative 2 %   Monocytes Absolute 0.3 0.1 - 1.0 K/uL   Eosinophils Relative 0 %   Eosinophils Absolute 0.0 0.0 - 0.5 K/uL   Basophils Relative 1 %   Basophils  Absolute 0.1 0.0 -  0.1 K/uL   WBC Morphology INCREASED BANDS (>20% BANDS)    Immature Granulocytes 1 %   Abs Immature Granulocytes 0.07 0.00 - 0.07 K/uL   Burr Cells PRESENT     Comment: Performed at [facility] [address], [place], [place] [address]  CBG monitoring, ED     Status: Abnormal   Collection Time: 12/13/22  8:00 AM  Result Value Ref Range   Glucose-Capillary 168 (H) 70 - 99 mg/dL    Comment: Glucose reference range applies only to samples taken after fasting for at least 8 hours.  I-stat chem 8, ed     Status: Abnormal   Collection Time: 12/13/22  8:18 AM  Result Value Ref Range   Sodium 143 135 - 145 mmol/L   Potassium 4.9 3.5 - 5.1 mmol/L   Chloride 113 (H) 98 - 111 mmol/L   BUN 15 6 - 20 mg/dL   Creatinine, Ser 6.04 (H) 0.44 - 1.00 mg/dL   Glucose, Bld 540 (H) 70 - 99 mg/dL    Comment: Glucose reference range applies only to samples taken after fasting for at least 8 hours.   Calcium, Ion 1.87 (HH) 1.15 - 1.40 mmol/L   TCO2 25 22 - 32 mmol/L   Hemoglobin 5.8 (LL) 12.0 - 15.0 g/dL   HCT 98.1 (L) 19.1 - 47.8 %   Comment NOTIFIED PHYSICIAN   CBC     Status: Abnormal   Collection Time: 12/13/22  8:27 AM  Result Value Ref Range   WBC 13.9 (H) 4.0 - 10.5 K/uL   RBC 2.43 (L) 3.87 - 5.11 MIL/uL   Hemoglobin 6.2 (LL) 12.0 - 15.0 g/dL    Comment: REPEATED TO VERIFY THIS CRITICAL RESULT HAS VERIFIED AND BEEN CALLED TO GRANT C. RN BY ALIEFENDIC,FATIMA ON 09 04 2024 AT 0841, AND HAS BEEN READ BACK.  THIS CRITICAL RESULT HAS VERIFIED AND BEEN CALLED TO GRANT C. RN BY ALIEFENDIC,FATIMA ON 09 04 2024 AT 0844, AND HAS BEEN READ BACK.     HCT 21.1 (L) 36.0 - 46.0 %   MCV 86.8 80.0 - 100.0 fL   MCH 25.5 (L) 26.0 - 34.0 pg   MCHC 29.4 (L) 30.0 - 36.0 g/dL   RDW 29.5 (H) 62.1 - 30.8 %   Platelets 347 150 - 400 K/uL   nRBC 1.4 (H) 0.0 - 0.2 %    Comment: Performed at [facility], [address] [place], [place] [address]   Comprehensive metabolic panel     Status: Abnormal   Collection Time: 12/13/22  8:27 AM  Result Value Ref Range   Sodium 137 135 - 145 mmol/L   Potassium 4.3 3.5 - 5.1 mmol/L   Chloride 108 98 - 111 mmol/L   CO2 14 (L) 22 - 32 mmol/L   Glucose, Bld 232 (H) 70 - 99 mg/dL    Comment: Glucose reference range applies only to samples taken after fasting for at least 8 hours.   BUN 21 (H) 6 - 20 mg/dL   Creatinine, Ser 6.96 (H) 0.44 - 1.00 mg/dL   Calcium 29.5 8.9 - 28.4 mg/dL    Comment: DELTA CHECK NOTED   Total Protein 3.3 (L) 6.5 - 8.1 g/dL   Albumin <1.3 (L) 3.5 - 5.0 g/dL   AST 244 (H) 15 - 41 U/L    Comment: RESULT CONFIRMED BY MANUAL DILUTION   ALT 77 (H) 0 - 44 U/L    Comment: RESULT CONFIRMED BY MANUAL DILUTION  Alkaline Phosphatase 77 38 - 126 U/L   Total Bilirubin 0.3 0.3 - 1.2 mg/dL   GFR, Estimated 39 (L) >60 mL/min    Comment: (NOTE) Calculated using the CKD-EPI Creatinine Equation (2021)    Anion gap 15 5 - 15    Comment: Performed at [facility], [address], [place], [place] [address]  Magnesium     Status: Abnormal   Collection Time: 12/13/22  8:27 AM  Result Value Ref Range   Magnesium 4.7 (H) 1.7 - 2.4 mg/dL    Comment: Performed at [facility], [address] [place], [place] [address]  Lactic acid, plasma     Status: Abnormal   Collection Time: 12/13/22  8:27 AM  Result Value Ref Range   Lactic Acid, Venous >9.0 (HH) 0.5 - 1.9 mmol/L    Comment: CRITICAL RESULT CALLED TO, READ BACK BY AND VERIFIED WITH HEAVNER,A. RN AT [phone] 12/13/22 MULLINS,T Performed at [facility], [address], [place], [place] [address]   Troponin I (High Sensitivity)     Status: None   Collection Time: 12/13/22  8:27 AM  Result Value Ref Range   Troponin I (High Sensitivity) 5 <18 ng/L    Comment: (NOTE) Elevated high sensitivity troponin I (hsTnI) values and significant  changes across serial measurements may  suggest ACS but many other  chronic and acute conditions are known to elevate hsTnI results.  Refer to the "Links" section for chest pain algorithms and additional  guidance. Performed at [facility], [address] [place], [place] [address]   ABO/Rh     Status: None   Collection Time: 12/13/22  8:27 AM  Result Value Ref Range   ABO/RH(D)      B POS Performed at [facility], [address] [place], [place] [address]   I-Stat Lactic Acid     Status: Abnormal   Collection Time: 12/13/22  8:32 AM  Result Value Ref Range   Lactic Acid, Venous 12.0 (HH) 0.5 - 1.9 mmol/L   Comment NOTIFIED PHYSICIAN   Type and screen [facility]     Status: None (Preliminary result)   Collection Time: 12/13/22  9:19 AM  Result Value Ref Range   ABO/RH(D) PENDING    Antibody Screen PENDING    Sample Expiration 12/16/2022,2359    Unit Number [id number]    Blood Component Type RBC LR PHER1    Unit division 00    Status of Unit ISSUED    Unit tag comment EMERGENCY RELEASE    Transfusion Status OK TO TRANSFUSE    Crossmatch Result PENDING    Unit Number [id number]    Blood Component Type RBC LR PHER2    Unit division 00    Status of Unit ISSUED    Unit tag comment EMERGENCY RELEASE    Transfusion Status      OK TO TRANSFUSE Performed at [facility], [address], [place], [place] [address]    Crossmatch Result PENDING   Glucose, capillary     Status: Abnormal   Collection Time: 12/13/22  9:33 AM  Result Value Ref Range   Glucose-Capillary 154 (H) 70 - 99 mg/dL    Comment: Glucose reference range applies only to samples taken after fasting for at least 8 hours.   DG Chest Portable 1 View  Result Date: 12/13/2022 CLINICAL DATA:  Post intubation. EXAM: PORTABLE CHEST 1 VIEW COMPARISON:  Abdominal radiograph-earlier same  day; CT abdomen pelvis-12/12/2022; chest CT-11/18/2022 FINDINGS: Unchanged cardiac  silhouette and mediastinal contours. Endotracheal tube tip overlies the tracheal air column with tip 1 cm above the carina. Enteric tube tip and side port project over the left upper abdominal quadrant. Left jugular approach port a catheter tip overlies the superior cavoatrial junction. Veiling opacity overlying the left lower lung compatible with known breast prosthesis. Minimal perihilar heterogeneous opacities favored to represent atelectasis. No supine evidence of pleural effusion or pneumothorax. No definite evidence of edema. Marked gaseous distention of multiple loops of colon. No acute osseous abnormalities. IMPRESSION: 1. Appropriately positioned support apparatus as above. No supine evidence of pneumothorax. 2. Marked gaseous distention of multiple loops of colon, better demonstrated on preceding abdominal CT. Electronically Signed   By: Simonne Come M.D.   On: 12/13/2022 08:46   DG Abd Portable 1 View  Result Date: 12/13/2022 CLINICAL DATA:  Post NG tube placement. EXAM: PORTABLE ABDOMEN - 1 VIEW COMPARISON:  Chest radiograph-earlier same day; CT abdomen pelvis-12/12/2022 FINDINGS: Enteric tube tip and side port project over the left upper abdominal quadrant Redemonstrated marked gaseous distention of multiple loops of colon. Epicardial pad overlies the cardiac apex. Uterine fibroid overlies the left hemipelvis. No acute osseous abnormalities. IMPRESSION: Enteric tube tip and side port project over the left upper abdominal quadrant. Electronically Signed   By: Simonne Come M.D.   On: 12/13/2022 08:44   CT ABDOMEN PELVIS WO CONTRAST  Result Date: 12/12/2022 CLINICAL DATA:  Acute nonlocalized abdominal pain. History of Crohn disease. Nausea, vomiting, and diarrhea. EXAM: CT ABDOMEN AND PELVIS WITHOUT CONTRAST TECHNIQUE: Multidetector CT imaging of the abdomen and pelvis was performed following the standard protocol without IV contrast. RADIATION DOSE REDUCTION: This exam was performed according to the  departmental dose-optimization program which includes automated exposure control, adjustment of the mA and/or kV according to patient size and/or use of iterative reconstruction technique. COMPARISON:  11/18/2022 FINDINGS: Lower chest: Lung bases are clear. Left breast implant. Presumed right mastectomy. Hepatobiliary: No focal liver abnormality is seen. No gallstones, gallbladder wall thickening, or biliary dilatation. Pancreas: Unremarkable. No pancreatic ductal dilatation or surrounding inflammatory changes. Spleen: Normal in size without focal abnormality. Adrenals/Urinary Tract: Adrenal glands are unremarkable. Kidneys are normal, without renal calculi, focal lesion, or hydronephrosis. Bladder is unremarkable. Stomach/Bowel: Postoperative subtotal colectomy with surgical anastomosis in the left lower quadrant. There are markedly dilated distal loops of small bowel with air-fluid levels and diffusely thickened wall. This could indicate chronic obstruction and possibly indicates anastomotic or inflammatory stricture. Appearance is similar to prior study. Remainder of the small bowel is decompressed. Vascular/Lymphatic: No significant vascular findings are present. No enlarged abdominal or pelvic lymph nodes. Reproductive: Enlarged uterus with myometrial calcifications consistent with fibroids. No abnormal adnexal masses. Other: Small amount of free fluid in the pelvis. No free air. Abdominal wall musculature appears intact. Musculoskeletal: No acute or significant osseous findings. IMPRESSION: 1. No significant change in appearance since prior examination. 2. Again, there is postoperative change with subtotal colectomy and markedly dilated distal small bowel. Dilated small bowel containing air-fluid levels consistent with liquid stool and have diffuse wall thickening. Changes may represent chronic obstruction possibly due to anastomotic or inflammatory stricture. Bowel wall thickening suggests inflammation  consistent with history of Crohn's. No significant change since prior study. 3. Calcified uterine fibroids. 4. Small amount of free fluid in the pelvis is likely reactive. Electronically Signed   By: Burman Nieves M.D.   On: 12/12/2022 22:54  Assessment/Plan 47 y/o F w/ a hx of crohn's s/p subtotal colectomy with a known stricture, metastatic breast CA, and IDA who presented with nausea and emesis and was later found down   - No indication for urgent surgical intervention at this time - Resuscitation per ICU - Personal review of the CT shows similar findings as yesterday without evidence of pneumoperitoneum - Will follow up final read of CT - Agree with GI consult given Hb drop and known stricture - Surgery will continue to follow  I reviewed last 24 h vitals and pain scores, last 24 h labs and trends, and last 24 h imaging results.  Tacy [facility] 12/13/2022, 9:37 AM Please see Amion for pager number during day hours 7:00am-4:30pm or 7:00am -11:30am on weekends

## 2022-12-13 NOTE — Procedures (Signed)
Arterial Catheter Insertion Procedure Note  HAIDEE HUNG  [id number]  1975/11/12  Date:12/13/22  Time:6:21 PM    Provider Performing: Lorin Glass    Procedure: Insertion of Arterial Line (28413) with US guidance (24401)   Indication(s) Blood pressure monitoring and/or need for frequent ABGs  Consent Verbal from family at bedside  Anesthesia None   Time Out Verified patient identification, verified procedure, site/side was marked, verified correct patient position, special equipment/implants available, medications/allergies/relevant history reviewed, required imaging and test results available.   Sterile Technique Maximal sterile technique including full sterile barrier drape, hand hygiene, sterile gown, sterile gloves, mask, hair covering, sterile ultrasound probe cover (if used).   Procedure Description Area of catheter insertion was cleaned with chlorhexidine and draped in sterile fashion. With real-time ultrasound guidance an arterial catheter was placed into the right radial artery.  Appropriate arterial tracings confirmed on monitor.     Complications/Tolerance None; patient tolerated the procedure well.   EBL Minimal   Specimen(s) None

## 2022-12-13 NOTE — Plan of Care (Signed)
  Problem: Clinical Measurements: Goal: Respiratory complications will improve Outcome: Progressing Goal: Cardiovascular complication will be avoided Outcome: Progressing   Problem: Coping: Goal: Ability to adjust to condition or change in health will improve Outcome: Not Progressing   Problem: Nutritional: Goal: Maintenance of adequate nutrition will improve Outcome: Not Progressing Goal: Progress toward achieving an optimal weight will improve Outcome: Not Progressing   Problem: Education: Goal: Knowledge of General Education information will improve Description: Including pain rating scale, medication(s)/side effects and non-pharmacologic comfort measures Outcome: Not Progressing   Problem: Health Behavior/Discharge Planning: Goal: Ability to manage health-related needs will improve Outcome: Not Progressing   Problem: Clinical Measurements: Goal: Diagnostic test results will improve Outcome: Not Progressing   Problem: Activity: Goal: Risk for activity intolerance will decrease Outcome: Not Progressing

## 2022-12-13 NOTE — ED Notes (Signed)
Pt belonging given to pt family.

## 2022-12-13 NOTE — ED Notes (Signed)
X-ray at bedside

## 2022-12-13 NOTE — Progress Notes (Signed)
Pharmacy Antibiotic Note  [name] is a 47 y.o. female admitted on 12/12/2022 with abdominal pain. Patient with history of Crohn's s/p subtotal colectomy. Patient was found down on the floor in the bathroom, she was pulseless. CPR started and ROSC achieved. Patient intubated and transferred to the ICU. On intubation, vomitus apparent in the airway per documentation. Pharmacy has been consulted for Zosyn dosing for aspiration pneumonia.  Plan: -Zosyn 3.375 g IV q8h extended infusion -Continue to follow renal function, cultures and clinical progress for dose adjustments and de-escalation as indicated  Height: 5\' 4"  (162.6 cm) IBW/kg (Calculated) : 54.7  Temp (24hrs), Avg:95.2 F (35.1 C), Min:92.8 F (33.8 C), Max:98.7 F (37.1 C)  Recent Labs  Lab 12/12/22 2130 12/13/22 0818 12/13/22 0827 12/13/22 0832 12/13/22 1128  WBC 10.8*  --  13.9*  --   --   CREATININE 0.74 1.20* 1.64*  --   --   LATICACIDVEN  --   --  >9.0* 12.0* 6.2*    Estimated Creatinine Clearance: 31.7 mL/min (A) (by C-G formula based on SCr of 1.64 mg/dL (H)).    Allergies  Allergen Reactions   Nsaids Other (See Comments)    Crohns disease/ IBD   Other     BLOOD PRODUCT REFUSAL    Antimicrobials this admission: Zosyn 9/4 >>   Microbiology results: 9/4 BCx: pending 9/4 MRSA PCR: negative  Thank you for allowing pharmacy to be a part of this patient's care.  Pricilla Riffle, PharmD, BCPS Clinical Pharmacist 12/13/2022 2:15 PM

## 2022-12-13 NOTE — Subjective & Objective (Signed)
CC: abd pain HPI: 47 year old African-American female history of Crohn's disease status post subtotal  colectomy with ileosigmoid anastomosis, history of breast cancer status post bilateral mastectomy moderate malnutrition who presents to the ER with recurrent abdominal pain.  She was discharged from the hospital on 11/23/2022 after spending 5 days in the hospital for abdominal pain.  During that time she had a flexible sigmoidoscopy which showed that she had a severe stenosis in the ileosigmoid anastomosis.  This was consistent with active Crohn's disease.  She been placed on IV steroids and changed over to p.o. prednisone.  She followed up in the GI office and was started on Entyvio.  She received her first infusion of Entyvio on 12/06/2022.  She has been tapering her prednisone as instructed.  She is down to 20 mg a day.  She noted that when she started tapering her prednisone, when she got to 30 mg she started noticing intermittent abdominal pain.  She tapered down to 20 mg of prednisone this past Saturday on August 31.  She noticed that she had daily abdominal pain since tapering to 20 mg a day.  Today, she woke up started having abdominal pain.  She ate a banana and some water thinking that this would help.  She has been taking her prednisone with food in the morning.  By the afternoon, her abdominal pain became so severe that she needed to call EMS.  She denies any diarrhea.  She has had nausea but no vomiting.  No fever.  No chills.  She states that prednisone makes her feel terrible.  She has difficulty concentrating at work.  She notices that she is very irritable.  She has difficulty with insomnia when she is on prednisone.  On arrival to the ER, temp 98.7 heart rate 114 blood pressure 131/101 98% room air saturations  White count 10.8, hemoglobin 10.7, platelets of 505  Sodium 134, potassium 3.2, chloride 102, bicarb of 19, BUN of 13, creatinine 0.74, glucose 115 Calcium 7.7, total  protein 6.5, BUN 2.3, AST of 16, ALT of 17 alk phos of 138, total bili 0.8  Lipase 19  CT abdomen pelvis showed dilated distal small bowel near the anastomosis and bowel wall thickening consistent with active Crohn's  EDP has messaged GI for consultation  Patient given IV fluids and IV morphine.  [facility] hospitalist consulted

## 2022-12-13 NOTE — Progress Notes (Signed)
Intra-abdominal pressure 28 @ 1805. Per CCM MD repeat abdominal pressure in 4 hours.

## 2022-12-13 NOTE — H&P (Signed)
NAME:  [name], MRN:  [id number], DOB:  Dec 23, 1975, LOS: 0 ADMISSION DATE:  12/12/2022, CONSULTATION DATE:  12/13/22 REFERRING MD:  Rubin Payor, MD CHIEF COMPLAINT:  cardiac arrest    History of Present Illness:  47 year old female with past medical history of recurrent breast cancer not currently on treatment, Crohn's disease s/p subtotal colectomy in 2021 recurrent obstructions, colonic stricture, IDA, recent hospitalization 8/10-8/15/2024 for obstruction seen by GI and general surgery. Underwent EGD and flexible sigmoidoscopy and discharged on steroids who presented to the emergency department on 12/12/2022 with abdominal pain, nausea, vomiting diarrhea. In the ED tachycardic, hypertensive. ED provider notes abdominal distention. She was given 2L IVF, morphine, IV PPI. Labs with WBC 10.8, Hgb 10.7, K 3.2. CT AP was ordered and showed markedly dilated distal small bowel consistent with chronic obstruction. No changes since prior study. Patient was admitted to [facility]. They consulted GI for input. Unfortunately, around 0820 this morning patient was found on the floor in the bathroom in ED pulseless. They estimate downtime around 5 minutes. ACLS was initiated. Concern for possible torsades de pointes and given IV mag, defibrillated. Coded for around 5 minutes. She was intubated. She had copious vomitus in the ETT. ROSC achieved and ICU was consulted. Notably, she had I-stat peri-code which showed hemoglobin of 5.5 down from 10 overnight.   Pertinent  Medical History  Crohn's disease with previous bowel obstruction s/p subtotal colectomy 2021, colonic stricture, IDA, breast cancer s/p mastectomy 2022  Significant Hospital Events: Including procedures, antibiotic start and stop dates in addition to other pertinent events   9/3: admitted to [facility] for abdominal distention, presumed Crohn's exacerbation 9/4: ~0820 cardiac arrest in ED. Admitted to ICU intubated, sedated. Hgb 5.5. Placed right femoral CVC.  Received 3 UPRBC, 3 FFP.  Interim History / Subjective:  Patient unable to participate in subjective history.   Objective   Blood pressure 98/82, pulse (!) 105, temperature (!) 93.7 F (34.3 C), resp. rate 16, height 5\' 4"  (1.626 m), last menstrual period 04/24/2021, SpO2 99%.    Vent Mode: PRVC FiO2 (%):  [60 %-100 %] 60 % Set Rate:  [16 bmp] 16 bmp Vt Set:  [440 mL] 440 mL PEEP:  [5 cmH20] 5 cmH20 Plateau Pressure:  [20 cmH20-26 cmH20] 26 cmH20   Intake/Output Summary (Last 24 hours) at 12/13/2022 1149 Last data filed at 12/13/2022 1050 Gross per 24 hour  Intake 3521.28 ml  Output 22 ml  Net 3499.28 ml   There were no vitals filed for this visit.  Examination: General: intubated, unresponsive not on sedation HENT: sluggish pupils, gaze aligned. No scleral icterus.  Lungs: intubated, vented FiO2 100%, PEEP 5; vomitus in the ETT  Cardiovascular: tachycardia without murmur, rub, gallop  Abdomen: distended, rigid. +BS Extremities: no edema  Neuro: exam limited 2/2 paralytic  GU: foley  Resolved Hospital Problem list   Cardiac arrest   Assessment & Plan:  Shock; likely hypovolemic at this time. Patient has had a few days of vomiting, diarrhea. Additional, 5g drop in hemoglobin from yesterday. At this time unclear etiology of bleed. CTAP did not show any obvious source of bleed. No hematemesis. She has history of rectal bleed, blood per rectum related to her history of Crohn's. Do not see a history of PUD, but is on chronic steroids, BUN mildly elevated. Less likely cardiogenic but will rule out. No recent history of fever, cough, dysuria regarding septic shock. She did have emesis in ETT on intubation so possible aspiration pneumonitis. She  does have mild leukocytosis but without fever, in setting of chronic steroid use, recent arrest will have to trend. She had CT head earlier with no acute bleed/mass and no trauma so doubt this to be neurogenic. No pertinent history like  anaphylaxis.  - on low dose levo, titrate to MAP >65% - ordered 3 U PRBC, 3 FFP for acute anemia. Recheck CBC after infusion  - con't zosyn  - trend fever curve, lactic  - ordered echo to f/u on arrest. Had torsades de pointes per EDP. Mag is now 4.7. will trend electrolytes   Acute hypoxic respiratory failure 2/2 cardiac arrest and aspiration  - continue vent support  - CXR, ABG  - VAP bundle ordered  - SAT/SBT when appropriate  - aspiration treatment as above   Crohn's disease s/p subtotal colectomy with ileal colonic anastomosis narrow on Entyvio and chronic steroids.  Small Bowel Obstruction; Presenting to ED with abdominal distention and pain. CTAP from last night with no change from previous. Impression: postoperative change with subtotal colectomy and markedly dilated distal small bowel. Dilated small bowel containing air-fluid levels consistent with liquid stool and have diffuse wall thickening. Changes may represent chronic obstruction possibly due to anastomotic or inflammatory stricture. Repeated CTAP performed this AM with SBO, cannot rule out closed loop obstruction  - NGT to low intermittent suction  - NPO - general surgery consulted. At this time they recommend clinical stabilization and then can reassess possible intervention for decompression vs other.  - GI consulted, appreciate recs.   Acute on chronic anemia; history of IDA and rectal bleeding. Acute anemia over night. New imaging without obvious bleed. No melena or BRBPR noted. No hematemesis.  - Hgb 10.7>5.8, recheck was 6.7. - q4h CBC - 3UPRBC, 3FFP  - BUN mildly elevated, on chronic steroids   ?PUD.  - GI consulted and appreciate recs   Recurrent breast cancer not currently on treatment  - per Dr. Pamelia Hoit note 10/11/22, patient has declined further treatment at this time.  - to have follow-up in September to discuss anti-estrogen therapy.    Best Practice (right click and "Reselect all SmartList Selections"  daily)   Diet/type: NPO DVT prophylaxis: not indicated - anemic - receiving PRBC GI prophylaxis: PPI Lines: Central line and yes and it is still needed Foley:  Yes, and it is still needed Code Status:  full code Last date of multidisciplinary goals of care discussion [9/4]  Labs   CBC: Recent Labs  Lab 12/12/22 2130 12/13/22 0818 12/13/22 0827  WBC 10.8*  --  13.9*  NEUTROABS 8.1*  --   --   HGB 10.7* 5.8* 6.2*  HCT 34.4* 17.0* 21.1*  MCV 82.5  --  86.8  PLT 505*  --  347    Basic Metabolic Panel: Recent Labs  Lab 12/12/22 2130 12/13/22 0818 12/13/22 0827  NA 134* 143 137  K 3.2* 4.9 4.3  CL 102 113* 108  CO2 19*  --  14*  GLUCOSE 115* 157* 232*  BUN 13 15 21*  CREATININE 0.74 1.20* 1.64*  CALCIUM 7.7*  --  10.2  MG  --   --  4.7*   GFR: Estimated Creatinine Clearance: 31.7 mL/min (A) (by C-G formula based on SCr of 1.64 mg/dL (H)). Recent Labs  Lab 12/12/22 2130 12/13/22 0827 12/13/22 0832  WBC 10.8* 13.9*  --   LATICACIDVEN  --  >9.0* 12.0*    Liver Function Tests: Recent Labs  Lab 12/12/22 2130 12/13/22 0827  AST  16 224*  ALT 17 77*  ALKPHOS 138* 77  BILITOT 0.8 0.3  PROT 6.5 3.3*  ALBUMIN 2.3* <1.5*   Recent Labs  Lab 12/12/22 2130  LIPASE 19   No results for input(s): "AMMONIA" in the last 168 hours.  ABG    Component Value Date/Time   PHART 7.16 (LL) 12/13/2022 0931   PCO2ART 38 12/13/2022 0931   PO2ART 369 (H) 12/13/2022 0931   HCO3 14.3 (L) 12/13/2022 0931   TCO2 25 12/13/2022 0818   ACIDBASEDEF 13.8 (H) 12/13/2022 0931   O2SAT 97.6 12/13/2022 0931     Coagulation Profile: Recent Labs  Lab 12/13/22 1038  INR 1.4*    Cardiac Enzymes: No results for input(s): "CKTOTAL", "CKMB", "CKMBINDEX", "TROPONINI" in the last 168 hours.  HbA1C: Hgb A1c MFr Bld  Date/Time Value Ref Range Status  12/13/2022 06:45 AM 5.7 (H) 4.8 - 5.6 % Final    Comment:    (NOTE) Pre diabetes:          5.7%-6.4%  Diabetes:               >6.4%  Glycemic control for   <7.0% adults with diabetes     CBG: Recent Labs  Lab 12/13/22 0800 12/13/22 0933 12/13/22 1143  GLUCAP 168* 154* 162*    Review of Systems:   Patient unable to participate   Past Medical History:  She,  has a past medical history of Breast cancer (HCC), Colon stricture (HCC) (05/07/2019), Crohn's colitis, with intestinal obstruction (HCC) (09/17/2013), Crohn's disease (HCC), Family history of breast cancer (06/11/2020), 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 (10/17/2010), Rectal bleeding, Uterine fibroid, and Vitamin D deficiency (09/24/2013).   Surgical History:   Past Surgical History:  Procedure Laterality Date   AXILLARY LYMPH NODE DISSECTION Right 11/09/2020   Procedure: RIGHT AXILLARY LYMPH NODE DISSECTION;  Surgeon: Emelia Loron, MD;  Location: [facility];  Service: General;  Laterality: Right;   BIOPSY  07/01/2020   Procedure: BIOPSY;  Surgeon: Benancio Deeds, MD;  Location: [facility];  Service: Gastroenterology;;   BIOPSY  01/27/2021   Procedure: BIOPSY;  Surgeon: Benancio Deeds, MD;  Location: [facility];  Service: Gastroenterology;;   BIOPSY  09/07/2021   Procedure: BIOPSY;  Surgeon: Lynann Bologna, MD;  Location: [facility];  Service: Gastroenterology;;   BIOPSY  11/21/2022   Procedure: BIOPSY;  Surgeon: Lemar Lofty., MD;  Location: [facility];  Service: Gastroenterology;;   BREAST BIOPSY Right 03/24/2022   [place] RT BREAST BX W LOC DEV 1ST LESION IMG BX SPEC US GUIDE 03/24/2022 [facility]   BREAST BIOPSY  04/28/2022   [place] RT RADIOACTIVE SEED LOC 04/28/2022 [facility]   BREAST IMPLANT REMOVAL Right 08/07/2022   Procedure: REMOVAL  OF RIGHT BREAST IMPLANTS;  Surgeon: Allena Napoleon, MD;  Location: [facility];  Service: Plastics;  Laterality: Right;   BREAST RECONSTRUCTION WITH PLACEMENT OF TISSUE EXPANDER AND FLEX HD (ACELLULAR  HYDRATED DERMIS) Bilateral 11/09/2020   Procedure: BILATERAL BREAST RECONSTRUCTION WITH PLACEMENT OF TISSUE EXPANDER AND FLEX HD (ACELLULAR HYDRATED DERMIS);  Surgeon: Allena Napoleon, MD;  Location: [facility];  Service: Plastics;  Laterality: Bilateral;   CAPSULOTOMY Bilateral 07/25/2021   Procedure: CAPSULOTOMY;  Surgeon: Allena Napoleon, MD;  Location: [facility];  Service: Plastics;  Laterality: Bilateral;   COLECTOMY  06/27/2019   COLONOSCOPY  2015   ESOPHAGOGASTRODUODENOSCOPY (EGD) WITH PROPOFOL N/A 11/21/2022  Procedure: ESOPHAGOGASTRODUODENOSCOPY (EGD) WITH PROPOFOL;  Surgeon: Meridee Score Netty Starring., MD;  Location: [facility];  Service: Gastroenterology;  Laterality: N/A;   FLEXIBLE SIGMOIDOSCOPY N/A 07/01/2020   Procedure: FLEXIBLE SIGMOIDOSCOPY;  Surgeon: Benancio Deeds, MD;  Location: [facility];  Service: Gastroenterology;  Laterality: N/A;   FLEXIBLE SIGMOIDOSCOPY N/A 01/27/2021   Procedure: FLEXIBLE SIGMOIDOSCOPY;  Surgeon: Benancio Deeds, MD;  Location: [facility];  Service: Gastroenterology;  Laterality: N/A;   FLEXIBLE SIGMOIDOSCOPY N/A 09/07/2021   Procedure: FLEXIBLE SIGMOIDOSCOPY;  Surgeon: Lynann Bologna, MD;  Location: [facility];  Service: Gastroenterology;  Laterality: N/A;   FLEXIBLE SIGMOIDOSCOPY N/A 11/21/2022   Procedure: FLEXIBLE SIGMOIDOSCOPY;  Surgeon: Meridee Score Netty Starring., MD;  Location: [facility];  Service: Gastroenterology;  Laterality: N/A;   NIPPLE SPARING MASTECTOMY Bilateral 11/09/2020   Procedure: BILATERAL NIPPLE SPARING MASTECTOMY;  Surgeon: Emelia Loron, MD;  Location: [facility];  Service: General;  Laterality: Bilateral;   PORT-A-CATH REMOVAL N/A 07/25/2021   Procedure: REMOVAL PORT-A-CATH;  Surgeon: Allena Napoleon, MD;  Location: [facility];  Service: Plastics;  Laterality: N/A;   PORTACATH PLACEMENT N/A 06/23/2020   Procedure: INSERTION PORT-A-CATH;  Surgeon:  Emelia Loron, MD;  Location: [facility];  Service: General;  Laterality: N/A;  START TIME OF 11:00 AM FOR 60 MINUTES [place] [facility]   PORTACATH PLACEMENT Left 05/02/2022   Procedure: INSERTION PORT-A-CATH;  Surgeon: Emelia Loron, MD;  Location: [facility];  Service: General;  Laterality: Left;   RADIOACTIVE SEED GUIDED EXCISIONAL BREAST BIOPSY Right 05/02/2022   Procedure: RADIOACTIVE SEED GUIDED RIGHT BREAST MASS EXCISION;  Surgeon: Emelia Loron, MD;  Location: [facility];  Service: General;  Laterality: Right;   REMOVAL OF BILATERAL TISSUE EXPANDERS WITH PLACEMENT OF BILATERAL BREAST IMPLANTS Bilateral 07/25/2021   Procedure: REMOVAL OF BILATERAL TISSUE EXPANDERS WITH PLACEMENT OF BILATERAL BREAST IMPLANTS;  Surgeon: Allena Napoleon, MD;  Location:  [facility];  Service: Plastics;  Laterality: Bilateral;  1.5     Social History:   reports that she has quit smoking. Her smoking use included cigarettes. She has never used smokeless tobacco. She reports current alcohol use of about 1.0 - 2.0 standard drink of alcohol per week. She reports that she does not use drugs.   Family History:  Her family history includes Breast cancer in her cousin and maternal aunt; Diabetes in her father and paternal aunt. There is no history of Esophageal cancer, Rectal cancer, Stomach cancer, Colon polyps, or Colon cancer.   Allergies Allergies  Allergen Reactions   Nsaids Other (See Comments)    Crohns disease/ IBD   Other     BLOOD PRODUCT REFUSAL     Home Medications  Prior to Admission medications   Medication Sig Start Date End Date Taking? Authorizing Provider  acetaminophen (TYLENOL) 500 MG tablet Take 500-1,000 mg by mouth every 6 (six) hours as needed for mild pain.   Yes [provider]  cholecalciferol (VITAMIN D3) 25 MCG (1000 UNIT) tablet Take 1,000 Units by mouth daily.   Yes [provider]  hyoscyamine  (LEVSIN SL) 0.125 MG SL tablet Place 1 tablet (0.125 mg total) under the tongue every 6 (six) hours as needed for cramping. 11/23/22  Yes Standley Brooking, MD  Multiple Vitamins-Minerals (MULTIVITAMIN WITH MINERALS) tablet Take 1 tablet by mouth daily.   Yes [provider]  nystatin (MYCOSTATIN) 100000 UNIT/ML suspension Take 5 mLs (500,000 Units total) by mouth 4 (four) times daily. 11/23/22  Yes Standley Brooking, MD  potassium chloride SA (KLOR-CON M) 20 MEQ tablet Take 1 tablet (20 mEq total) by mouth daily. 12/27/21  Yes Iva Boop, MD  predniSONE (DELTASONE) 20 MG tablet Take 2 tablets (40 mg total) by mouth daily with breakfast for 7 days, THEN 1.5 tablets (30 mg total) daily with breakfast for 7 days, THEN 1 tablet (20 mg total) daily with breakfast for 21 days. 11/24/22 12/29/22 Yes Standley Brooking, MD  simethicone (MYLICON) 80 MG chewable tablet Chew 1 tablet (80 mg total) by mouth 4 (four) times daily as needed for flatulence. 11/23/22  Yes Standley Brooking, MD     Critical care time: 68

## 2022-12-13 NOTE — Progress Notes (Signed)
Chaplain engaged in an initial visit with [name]'s family at the bedside. Chaplain worked to introduce herself and build rapport. Daughter voiced that she didn't want a Chaplain at this time. Chaplain offered understanding and let them know she is here if needed.   Chaplain was able to bring family water as they waited for [name] to arrive on the ICU in the waiting area.   Chaplain is available to provide support as needed.  12/13/22 0900  Spiritual Encounters  Type of Visit Initial  Care provided to: Family  Reason for visit Code

## 2022-12-13 NOTE — Consult Note (Signed)
Consultation Note   Referring Provider:  [facility] PCP: Ollen Bowl, MD Primary Gastroenterologist: Dr. Stan Head      Reason for Consultation: severe abdominal pain with hx of Crohn's disease   DOA: 12/12/2022         Hospital Day: 2  ASSESSMENT & PLAN   47 y.o. year old female with medical history of Crohn's disease s/p subtotal colectomy in 2021 recurrent obstructions, colonic stricture, IDA who presented to the emergency department on 12/12/2022 with abdominal pain, nausea, vomiting, diarrhea. In the ED tachycardic, hypertensive. ED provider notes abdominal distention. She was given 2L IVF, morphine, IV PPI. Labs with WBC 10.8, Hgb 10.7, K 3.2. CT AP was ordered and showed markedly dilated distal small bowel consistent with chronic obstruction. No changes since prior study. Patient received Entyvio infusion on 8/28 and tapering her oral prednisone. Patient refused IV steroids in ER. They consulted GI for input.    Crohn's disease, s/p subtotal colectomy with ileal colonic anastomosis narrowing, on Entyvio and prednisone taper. Patient was most recently admitted into hospital on 8/11 with recurrent SBO.CTAP with contrast 11/18/2022 showed evidence of past subtotal colectomy with marked long segment small bowel dilation demonstrating mild mural thickening with abrupt caliber transition near the ileosigmoid anastomosis, suspicious for small-bowel obstruction.  Flex sig on 8/13 showed severe stenosis in the ileosigmoid anastomosis. She was discharged home on 8/15 with Prednisone taper with plans to restart Entyvio (IV 300mg , week 0,2,then SQ Q2 weekly). She received her first infusion of Entyvio on 12/06/2022.  She has been tapering her prednisone down to 20 mg a day upon this admission. Readmitted for severe abdominal pain. NG tube was placed in ER without much output. Surgical consult with no indication for surgical intervention at this time.  Repeat CT A/P ordered and pending. -There was mention of surgery questioning decompression of below? Consider rectal tube.  Sepsis/PEA arrest, coded in ED and now intubated in ICU. WBC 10.8> 13.9. Elevated Lactic acid 12.0. Hgb 10.7>5.8.   Acute on chronic anemia Hgb 10.7>5.8. Her baseline is 10-11.History of IDA. Last Iron 22 (11/20/22). Transferred to ICU. Pending 3 units PRBC's in ICU.  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  -managed by oncology and Dr. Trudee Kuster.   HISTORY OF PRESENT ILLNESS   Patient found on floor in bathroom in ER this morning. She was found with no pulse and given CPR. Found to have potentially a torsades on rhythm check. Defibrillated and Magnesium given. Coded for around 5 minutes. She was intubated. She had copious vomitus in the ETT. ROSC achieved and ICU was consulted. Notably her hemoglobin of 5.5 down from 10 overnight. Transferred to ICU. No output in NG tube LIWS. Distended abdomen. No bowel movements per RN.  PMH: Patients crohn's colitis and small bowel disease was diagnosed 2008, initially treated with mesalamine for a number of years.  2021 had sigmoid stricture, not able to pass with scope, and questionable mass in the right colon.  Subtotal colectomy by Dr. Maisie Fus March 2021.   Multiple hospitalizations with obstructions after that, eventually treated with Entyvio late 2023 therapy discontinued early 2024 due to recurrent breast cancer and need  for chemotherapy and immunosuppression contraindicated.   Patient was most recently admitted into hospital on 11/19/2022 with recurrent SBO.CTAP with contrast 11/18/2022 showed evidence of past subtotal colectomy with marked long segment small bowel dilation demonstrating mild mural thickening with abrupt caliber transition near the ileosigmoid anastomosis, suspicious for small-bowel obstruction. Patient was discharged on 11/23/22 after 5  days in the hospital for abdominal pain. Flex sig showed severe stenosis in the ileosigmoid anastomosis. She was discharged home on Prednisone taper with plans to restart Entyvio (IV 300mg , week 0,2,then SQ Q2 weekly).   11/21/2022 flex sig (most recent endoscopic exam). See full report below.  She received her first infusion of Entyvio on 12/06/2022.  She has been tapering her prednisone down to 20 mg a day.   Previous GI Evaluations   CT ABDOMEN AND PELVIS WITHOUT CONTRAST 9/3  IMPRESSION: 1. No significant change in appearance since prior examination. 2. Again, there is postoperative change with subtotal colectomy and markedly dilated distal small bowel. Dilated small bowel containing air-fluid levels consistent with liquid stool and have diffuse wall thickening. Changes Benn Tarver represent chronic obstruction possibly due to anastomotic or inflammatory stricture. Bowel wall thickening suggests inflammation consistent with history of Crohn's. No significant change since prior study. 3. Calcified uterine fibroids. 4. Small amount of free fluid in the pelvis is likely reactive.  PORTABLE ABDOMEN - 1 VIEW 9/3  IMPRESSION: Enteric tube tip and side port project over the left upper abdominal quadrant.   EGD 11/21/2022: - No gross lesions in the entire esophagus. Z-line irregular, 34 cm from the incisors.  - Erythematous mucosa in the stomach. Biopsied.  - No gross lesions in the duodenal bulb, in the first portion of the duodenum, in the second portion of the duodenum, in the major papilla and in the third portion of the duodenum. Biopsied.    Flex sigmoidoscopy 11/21/2022: - Perianal skin tags found on perianal exam. Hemorrhoids found on digital rectal exam.  - Non-bleeding non-thrombosed internal hemorrhoids.  - Granularity in the remaining rectum/rectosigmoid. Biopsied.  - Patent end-to-side ileo-colonic anastomosis, characterized by stenosis, congestion, erythema, friable mucosa,  inflammation and severe stenosis. This was approximately 5 mm in diameter and 4 cm in length and only traversed with the neonatal endoscope. There is no ability to perform stenting.  - Dilated neoterminal ileum. Suctioned via endoscope.  - Segmental mild mucosal changes were found. Biopsied  FINAL MICROSCOPIC DIAGNOSIS:   A. DUODENUM, BIOPSY:  - Benign small bowel mucosa with focal foveolar metaplasia, suggestive  of peptic injury   B. STOMACH, BIOPSY:  - Gastric antral and oxyntic mucosa with no specific pathologic changes  - Negative for H. pylori on HE stain  - Negative for intestinal metaplasia or malignancy   C. NEOTERMINAL ILEUM, BIOPSY:  - Benign ileal mucosa with mild lamina propria edema  - Negative for features of chronicity or acute inflammation   D. ANASTOMOSIS STRICTURE, BIOPSY:  - Ulcerated mucosa with acute and chronic inflammation, and granulation  tissue, consistent with anastomotic site   E. RECTUM, BIOPSY:  - Benign colorectal mucosa with minimal architectural disarray  - Negative for acute inflammation  - Negative for dysplasia or malignancy    CT ABDOMEN AND PELVIS WITH CONTRAST (11/17/2012) CT ANGIOGRAPHY CHEST   IMPRESSION: 1. No evidence of pulmonary embolism. 2. Status post subtotal colectomy with marked long segment small bowel dilation demonstrating mild mural thickening, similar to 09/27/2022, with abrupt caliber transition near the ileosigmoid anastomosis, suspicious for small-bowel obstruction. 3. Unchanged subpleural ground-glass  densities along the anterior right upper and middle lobe, likely postradiation change. 4. 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 5. Unchanged 3 mm basilar left lower lobe nodule.  CT ABDOMEN AND PELVIS WITH CONTRAST  (09/27/2022)  IMPRESSION: Status post subtotal colectomy. Severe diffuse small bowel dilatation  is noted with severe narrowing or transition zone seen in the pelvis consistent with small bowel obstruction. It is uncertain if this is inflammatory or neoplastic in etiology.   Enlarged fibroid uterus.  Labs and Imaging: Recent Labs    12/12/22 2130 12/13/22 0818  WBC 10.8*  --   HGB 10.7* 5.8*  HCT 34.4* 17.0*  PLT 505*  --    Recent Labs    12/12/22 2130 12/13/22 0818  NA 134* 143  K 3.2* 4.9  CL 102 113*  CO2 19*  --   GLUCOSE 115* 157*  BUN 13 15  CREATININE 0.74 1.20*  CALCIUM 7.7*  --    Recent Labs    12/12/22 2130  PROT 6.5  ALBUMIN 2.3*  AST 16  ALT 17  ALKPHOS 138*  BILITOT 0.8   No results for input(s): "HEPBSAG", "HCVAB", "HEPAIGM", "HEPBIGM" in the last 72 hours. No results for input(s): "LABPROT", "INR" in the last 72 hours.   Past Medical History:  Diagnosis Date   Breast cancer (HCC)    right   Colon stricture (HCC) 05/07/2019   Crohn's colitis, with intestinal obstruction (HCC) 09/17/2013   Dx 2006 - right and left colon involved 2011 - pan-colitis 09/17/2013 left colitis 60-20 cm March 2021 subtotal colectomy ileosigmoid anastomosis for colonic strictures-Dr. Maisie Fus     Crohn's disease [facility])    Family history of breast cancer 06/11/2020   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 10/17/2010   Rectal bleeding    Uterine fibroid    Vitamin D deficiency 09/24/2013    Past Surgical History:  Procedure Laterality Date   AXILLARY LYMPH NODE DISSECTION Right 11/09/2020   Procedure: RIGHT AXILLARY LYMPH NODE DISSECTION;  Surgeon: Emelia Loron, MD;  Location: [facility];  Service: General;  Laterality: Right;   BIOPSY  07/01/2020   Procedure: BIOPSY;  Surgeon: Benancio Deeds, MD;  Location: [facility];  Service: Gastroenterology;;   BIOPSY  01/27/2021   Procedure: BIOPSY;  Surgeon: Benancio Deeds, MD;  Location: [facility];  Service: Gastroenterology;;   BIOPSY  09/07/2021    Procedure: BIOPSY;  Surgeon: Lynann Bologna, MD;  Location: [facility];  Service: Gastroenterology;;   BIOPSY  11/21/2022   Procedure: BIOPSY;  Surgeon: Lemar Lofty., MD;  Location: [facility];  Service: Gastroenterology;;   BREAST BIOPSY Right 03/24/2022   [place] RT BREAST BX W LOC DEV 1ST LESION IMG BX SPEC US GUIDE 03/24/2022 [facility]   BREAST BIOPSY  04/28/2022   [place] RT RADIOACTIVE SEED LOC 04/28/2022 [facility]   BREAST IMPLANT REMOVAL Right 08/07/2022   Procedure: REMOVAL  OF RIGHT BREAST IMPLANTS;  Surgeon: Allena Napoleon, MD;  Location: [facility];  Service: Plastics;  Laterality: Right;   BREAST RECONSTRUCTION WITH PLACEMENT OF TISSUE EXPANDER AND FLEX HD (ACELLULAR HYDRATED DERMIS) Bilateral 11/09/2020   Procedure: BILATERAL BREAST RECONSTRUCTION WITH PLACEMENT OF TISSUE EXPANDER AND FLEX HD (ACELLULAR HYDRATED DERMIS);  Surgeon: Allena Napoleon, MD;  Location:  SUR[facility]ERY CENTER;  Service: Plastics;  Laterality: Bilateral;   CAPSULOTOMY Bilateral 07/25/2021   Procedure: CAPSULOTOMY;  Surgeon: Allena Napoleon, MD;  Location: [facility];  Service: Plastics;  Laterality: Bilateral;   COLECTOMY  06/27/2019   COLONOSCOPY  2015   ESOPHAGOGASTRODUODENOSCOPY (EGD) WITH PROPOFOL N/A 11/21/2022   Procedure: ESOPHAGOGASTRODUODENOSCOPY (EGD) WITH PROPOFOL;  Surgeon: Lemar Lofty., MD;  Location: [facility];  Service: Gastroenterology;  Laterality: N/A;   FLEXIBLE SIGMOIDOSCOPY N/A 07/01/2020   Procedure: FLEXIBLE SIGMOIDOSCOPY;  Surgeon: Benancio Deeds, MD;  Location: [facility];  Service: Gastroenterology;  Laterality: N/A;   FLEXIBLE SIGMOIDOSCOPY N/A 01/27/2021   Procedure: FLEXIBLE SIGMOIDOSCOPY;  Surgeon: Benancio Deeds, MD;  Location: [facility];  Service: Gastroenterology;  Laterality: N/A;   FLEXIBLE SIGMOIDOSCOPY N/A 09/07/2021   Procedure: FLEXIBLE SIGMOIDOSCOPY;  Surgeon: Lynann Bologna, MD;  Location:  [facility];  Service: Gastroenterology;  Laterality: N/A;   FLEXIBLE SIGMOIDOSCOPY N/A 11/21/2022   Procedure: FLEXIBLE SIGMOIDOSCOPY;  Surgeon: Meridee Score Netty Starring., MD;  Location: [facility];  Service: Gastroenterology;  Laterality: N/A;   NIPPLE SPARING MASTECTOMY Bilateral 11/09/2020   Procedure: BILATERAL NIPPLE SPARING MASTECTOMY;  Surgeon: Emelia Loron, MD;  Location: [facility];  Service: General;  Laterality: Bilateral;   PORT-A-CATH REMOVAL N/A 07/25/2021   Procedure: REMOVAL PORT-A-CATH;  Surgeon: Allena Napoleon, MD;  Location: [facility];  Service: Plastics;  Laterality: N/A;   PORTACATH PLACEMENT N/A 06/23/2020   Procedure: INSERTION PORT-A-CATH;  Surgeon: Emelia Loron, MD;  Location: [facility];  Service: General;  Laterality: N/A;  START TIME OF 11:00 AM FOR 60 MINUTES [place] [facility]   PORTACATH PLACEMENT Left 05/02/2022   Procedure: INSERTION PORT-A-CATH;  Surgeon: Emelia Loron, MD;  Location: [facility];  Service: General;  Laterality: Left;   RADIOACTIVE SEED GUIDED EXCISIONAL BREAST BIOPSY Right 05/02/2022   Procedure: RADIOACTIVE SEED GUIDED RIGHT BREAST MASS EXCISION;  Surgeon: Emelia Loron, MD;  Location: [facility];  Service: General;  Laterality: Right;   REMOVAL OF BILATERAL TISSUE EXPANDERS WITH PLACEMENT OF BILATERAL BREAST IMPLANTS Bilateral 07/25/2021   Procedure: REMOVAL OF BILATERAL TISSUE EXPANDERS WITH PLACEMENT OF BILATERAL BREAST IMPLANTS;  Surgeon: Allena Napoleon, MD;  Location: [facility];  Service: Plastics;  Laterality: Bilateral;  1.5    Family History  Problem Relation Age of Onset   Diabetes Father    Breast cancer Maternal Aunt        dx unknown age   Diabetes Paternal Aunt    Breast cancer Cousin        maternal cousin; dx unknown age   Esophageal cancer Neg Hx    Rectal cancer Neg Hx    Stomach cancer Neg Hx    Colon polyps Neg Hx     Colon cancer Neg Hx     Prior to Admission medications   Medication Sig Start Date End Date Taking? Authorizing Provider  acetaminophen (TYLENOL) 500 MG tablet Take 500-1,000 mg by mouth every 6 (six) hours as needed for mild pain.   Yes [provider]  cholecalciferol (VITAMIN D3) 25 MCG (1000 UNIT) tablet Take 1,000 Units by mouth daily.   Yes [provider]  hyoscyamine (LEVSIN SL) 0.125 MG SL tablet Place 1 tablet (0.125 mg total) under the tongue every 6 (six) hours as needed for cramping. 11/23/22  Yes Standley Brooking, MD  Multiple Vitamins-Minerals (MULTIVITAMIN WITH MINERALS) tablet Take 1 tablet by mouth daily.   Yes [provider]  nystatin (MYCOSTATIN) 100000 UNIT/ML suspension Take 5 mLs (500,000 Units total) by mouth  4 (four) times daily. 11/23/22  Yes Standley Brooking, MD  potassium chloride SA (KLOR-CON M) 20 MEQ tablet Take 1 tablet (20 mEq total) by mouth daily. 12/27/21  Yes Iva Boop, MD  predniSONE (DELTASONE) 20 MG tablet Take 2 tablets (40 mg total) by mouth daily with breakfast for 7 days, THEN 1.5 tablets (30 mg total) daily with breakfast for 7 days, THEN 1 tablet (20 mg total) daily with breakfast for 21 days. 11/24/22 12/29/22 Yes Standley Brooking, MD  simethicone (MYLICON) 80 MG chewable tablet Chew 1 tablet (80 mg total) by mouth 4 (four) times daily as needed for flatulence. 11/23/22  Yes Standley Brooking, MD    Current Facility-Administered Medications  Medication Dose Route Frequency Provider Last Rate Last Admin   acetaminophen (TYLENOL) tablet 650 mg  650 mg Oral Q6H PRN Carollee Herter, DO       Or   acetaminophen (TYLENOL) suppository 650 mg  650 mg Rectal Q6H PRN Carollee Herter, DO       heparin injection 5,000 Units  5,000 Units Subcutaneous Q8H Carollee Herter, DO       insulin aspart (novoLOG) injection 0-9 Units  0-9 Units Subcutaneous Q4H Lorin Glass, MD       lactated ringers infusion   Intravenous Continuous Carollee Herter,  DO 125 mL/hr at 12/13/22 0056 New Bag at 12/13/22 0056   melatonin tablet 10 mg  10 mg Oral QHS PRN Carollee Herter, DO       methylPREDNISolone sodium succinate (SOLU-MEDROL) 125 mg/2 mL injection 125 mg  125 mg Intravenous Once Carollee Herter, DO       morphine (PF) 4 MG/ML injection 4 mg  4 mg Intravenous Q2H PRN Carollee Herter, DO   4 mg at 12/13/22 0735   ondansetron (ZOFRAN) tablet 4 mg  4 mg Oral Q6H PRN Carollee Herter, DO       Or   ondansetron [facility]) injection 4 mg  4 mg Intravenous Q6H PRN Carollee Herter, DO       pantoprazole (PROTONIX) injection 40 mg  40 mg Intravenous Q24H Carollee Herter, DO       rocuronium bromide 100 MG/10ML SOSY            succinylcholine (ANECTINE) 200 MG/10ML syringe            Current Outpatient Medications  Medication Sig Dispense Refill   acetaminophen (TYLENOL) 500 MG tablet Take 500-1,000 mg by mouth every 6 (six) hours as needed for mild pain.     cholecalciferol (VITAMIN D3) 25 MCG (1000 UNIT) tablet Take 1,000 Units by mouth daily.     hyoscyamine (LEVSIN SL) 0.125 MG SL tablet Place 1 tablet (0.125 mg total) under the tongue every 6 (six) hours as needed for cramping. 30 tablet 0   Multiple Vitamins-Minerals (MULTIVITAMIN WITH MINERALS) tablet Take 1 tablet by mouth daily.     nystatin (MYCOSTATIN) 100000 UNIT/ML suspension Take 5 mLs (500,000 Units total) by mouth 4 (four) times daily. 473 mL 0   potassium chloride SA (KLOR-CON M) 20 MEQ tablet Take 1 tablet (20 mEq total) by mouth daily. 90 tablet 2   predniSONE (DELTASONE) 20 MG tablet Take 2 tablets (40 mg total) by mouth daily with breakfast for 7 days, THEN 1.5 tablets (30 mg total) daily with breakfast for 7 days, THEN 1 tablet (20 mg total) daily with breakfast for 21 days. 45.5 tablet 0   simethicone (MYLICON) 80 MG chewable tablet Chew 1 tablet (80  mg total) by mouth 4 (four) times daily as needed for flatulence. 30 tablet 0    Allergies as of 12/12/2022 - Review Complete 12/12/2022  Allergen Reaction Noted    Nsaids Other (See Comments) 05/27/2021   Other  06/23/2019    Social History   Socioeconomic History   Marital status: Single    Spouse name: Not on file   Number of children: 1   Years of education: 13   Highest education level: Not on file  Occupational History   Occupation: Medical Coder   Tobacco Use   Smoking status: Former    Types: Cigarettes   Smokeless tobacco: Never  Vaping Use   Vaping status: Never Used  Substance and Sexual Activity   Alcohol use: Yes    Alcohol/week: 1.0 - 2.0 standard drink of alcohol    Types: 1 - 2 Glasses of wine per week   Drug use: No   Sexual activity: Yes    Partners: Male    Comment: Intermittent protection not consistent  Other Topics Concern   Not on file  Social History Narrative   Single, 1 daughter born 2001 approximately   Medical coder for [employer]   She is a former smoker, occasional alcohol no drug use   Social Determinants of Corporate investment banker Strain: Not on file  Food Insecurity: No Food Insecurity (11/21/2022)   Hunger Vital Sign    Worried About Running Out of Food in the Last Year: Never true    Ran Out of Food in the Last Year: Never true  Transportation Needs: No Transportation Needs (11/21/2022)   PRAPARE - Administrator, Civil Service (Medical): No    Lack of Transportation (Non-Medical): No  Physical Activity: Not on file  Stress: Not on file  Social Connections: Not on file  Intimate Partner Violence: Not At Risk (11/21/2022)   Humiliation, Afraid, Rape, and Kick questionnaire    Fear of Current or Ex-Partner: No    Emotionally Abused: No    Physically Abused: No    Sexually Abused: No   Code Status   Code Status: Full Code  Review of Systems: All systems reviewed and negative except where noted in HPI.  Physical Exam: Vital signs in last 24 hours: Temp:  [98.7 F (37.1 C)] 98.7 F (37.1 C) (09/04 0400) Pulse Rate:  [81-139] 102 (09/04 0815) Resp:  [11-27] 19 (09/04  0653) BP: (91-150)/(73-104) 150/88 (09/04 0815) SpO2:  [90 %-100 %] 100 % (09/04 0820) FiO2 (%):  [100 %] 100 % (09/04 0820)    General:  chronically ill appearing, intubated Eyes: Pupils equal Nose: small amount green bilious output via NG tube Lungs:  diminished bilateral lobes Heart:  irregular rate, regular rhythm.  Abdomen: firm, distended, hypoactive bowel sounds, no masses felt Rectal :  Deferred Msk: Symmetrical without gross deformities.  Neurologic:  intubated, unable to assess Extremities : trace edema Skin:  Intact without significant lesions.    Intake/Output from previous day: 09/03 0701 - 09/04 0700 In: 2100 [IV Piggyback:2100] Out: -  Intake/Output this shift:  No intake/output data recorded.  Principal Problem:   Exacerbation of Crohn's disease of large intestine (HCC) Active Problems:   Malignant neoplasm of upper-outer quadrant of right breast in female, estrogen receptor positive (HCC)   Hypokalemia   Colonic stricture (HCC)   SBO (small bowel obstruction) (HCC)    Yasmeen Manka, NP-C @  12/13/2022, 8:32 AM

## 2022-12-13 NOTE — Progress Notes (Addendum)
eLink Physician-Brief Progress Note Patient Name: [name] DOB: [birth year] MRN: [id number]   Date of Service  12/13/2022  HPI/Events of Note  47 year old female with a history of Crohn's disease status post subtotal colectomy complicated by stricture that presented with nausea and vomiting.  She had a brief arrest in the ER.  Brought up to the ICU for monitoring.  eICU Interventions  Verify NG tube placement with repeat abdominal film since late was moved.  For now, would hold enteral/oral meds.  Maintain Tylenol PR.   2323 -abdominal radiograph fairly stable-no evidence of pneumoperitoneum.  Mild increase in norepinephrine into the low 20s.  Will add on vasopressin.  Already on stress dose steroids with methylprednisolone daily  Intervention Category Minor Interventions: Clinical assessment - ordering diagnostic tests  Phoenyx Paulsen 12/13/2022, 9:03 PM

## 2022-12-13 NOTE — Assessment & Plan Note (Addendum)
Admit to med/tele bed. Pt had first Entyviio infusion last week. Pt has been tapering on prednisone and currently is on 20 mg. She thinks her abd started hurting when she tapered to 30 mg. Abd hurt worse when she tapered to 20 mg daily prednisone. Pt states that steroids make her irritable, has difficulty concentrating on her job, has difficulty sleeping. CT abd shows persistent inflammation near her colonic anastomosis.  Pt refusing to receive IV steroids. She states that if she needs to start back again on a higher dose of prednisone(40 mg), then she refuses to take any more IV steroids. EDP has consulted GI. Not sure there is much that can be offered given her anastomotic stenosis with inability to stent it. GI needs to see her and make some decisions with patient on how to proceed. ??does she need colostomy??  Continue with IVF and IV morphine. NPO with ice chips.

## 2022-12-13 NOTE — Assessment & Plan Note (Signed)
Replete with IV kcl. Continue with IV with kcl. Repeat BMP in AM.

## 2022-12-13 NOTE — Assessment & Plan Note (Signed)
In July 2024, pt told oncology she wants to stop all further breast cancer chemo treatment due to how poorly they have made her feel. Oncology has agreed. Pt currently taking no therapy for her breast cancer.

## 2022-12-13 NOTE — Assessment & Plan Note (Signed)
Seen on her last flex sig 11-2022.  " There was evidence of a prior end-to-side ileo-colonic anastomosis in the recto-sigmoid colon (approximately at 18 cm). This was patent and was characterized by congestion, erythema, friable mucosa, inflammation, severe stenosis and stenosis 5 mm (inner diameter) x 4 cm (length)."  GI consult consult and comment on next steps to address pt's colonic stenosis.

## 2022-12-13 NOTE — ED Notes (Signed)
ED TO INPATIENT HANDOFF REPORT  ED Nurse Name and Phone #: Claiborne Rigg Name/Age/Gender [name] 47 y.o. female Room/Bed: WA24/WA24  Code Status   Code Status: Full Code  Home/SNF/Other Home Patient oriented to: self, place, time, and situation Is this baseline? Yes   Triage Complete: Triage complete  Chief Complaint Exacerbation of Crohn's disease of large intestine (HCC) [K50.10]  Triage Note Patient BIB EMS for evaluation of abdominal pain.  Hx of crohn's and started having burning abdominal pain today.  Having nausea, vomiting, and diarrhea.  Given Fentanyl 100 mcg IV PTA.  Has breast CA. Currently in between treatment cycles.     Allergies Allergies  Allergen Reactions   Nsaids Other (See Comments)    Crohns disease/ IBD   Other     BLOOD PRODUCT REFUSAL    Level of Care/Admitting Diagnosis ED Disposition     ED Disposition  Admit   Condition  --   Comment  Hospital Area: [facility] [[id number]]  Level of Care: Telemetry [5]  Admit to tele based on following criteria: Complex arrhythmia (Bradycardia/Tachycardia)  May place patient in observation at [facility] or [facility] if equivalent level of care is available:: No  Covid Evaluation: Asymptomatic - no recent exposure (last 10 days) testing not required  Diagnosis: Exacerbation of Crohn's disease of large intestine [facility]) [[id number]]  Admitting Physician: Imogene Burn, ERIC [[id number]]  Attending Physician: Imogene Burn, ERIC [[id number]]          B Medical/Surgery History Past Medical History:  Diagnosis Date   Breast cancer (HCC)    right   Colon stricture (HCC) 05/07/2019   Crohn's colitis, with intestinal obstruction (HCC) 09/17/2013   Dx 2006 - right and left colon involved 2011 - pan-colitis 09/17/2013 left colitis 60-20 cm March 2021 subtotal colectomy ileosigmoid anastomosis for colonic strictures-Dr. Maisie Fus     Crohn's disease [facility])    Family history of breast cancer 06/11/2020    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 10/17/2010   Rectal bleeding    Uterine fibroid    Vitamin D deficiency 09/24/2013   Past Surgical History:  Procedure Laterality Date   AXILLARY LYMPH NODE DISSECTION Right 11/09/2020   Procedure: RIGHT AXILLARY LYMPH NODE DISSECTION;  Surgeon: Emelia Loron, MD;  Location: [facility];  Service: General;  Laterality: Right;   BIOPSY  07/01/2020   Procedure: BIOPSY;  Surgeon: Benancio Deeds, MD;  Location: [facility];  Service: Gastroenterology;;   BIOPSY  01/27/2021   Procedure: BIOPSY;  Surgeon: Benancio Deeds, MD;  Location: [facility];  Service: Gastroenterology;;   BIOPSY  09/07/2021   Procedure: BIOPSY;  Surgeon: Lynann Bologna, MD;  Location: [facility];  Service: Gastroenterology;;   BIOPSY  11/21/2022   Procedure: BIOPSY;  Surgeon: Lemar Lofty., MD;  Location: [facility];  Service: Gastroenterology;;   BREAST BIOPSY Right 03/24/2022   [place] RT BREAST BX W LOC DEV 1ST LESION IMG BX SPEC US GUIDE 03/24/2022 [facility]   BREAST BIOPSY  04/28/2022   [place] RT RADIOACTIVE SEED LOC 04/28/2022 [facility]   BREAST IMPLANT REMOVAL Right 08/07/2022   Procedure: REMOVAL  OF RIGHT BREAST IMPLANTS;  Surgeon: Allena Napoleon, MD;  Location: [facility];  Service: Plastics;  Laterality: Right;   BREAST RECONSTRUCTION WITH PLACEMENT OF TISSUE EXPANDER AND FLEX HD (ACELLULAR HYDRATED DERMIS) Bilateral 11/09/2020   Procedure: BILATERAL BREAST RECONSTRUCTION WITH PLACEMENT OF TISSUE EXPANDER AND FLEX  HD (ACELLULAR HYDRATED DERMIS);  Surgeon: Allena Napoleon, MD;  Location: [facility];  Service: Plastics;  Laterality: Bilateral;   CAPSULOTOMY Bilateral 07/25/2021   Procedure: CAPSULOTOMY;  Surgeon: Allena Napoleon, MD;  Location: [facility];  Service: Plastics;  Laterality: Bilateral;   COLECTOMY  06/27/2019   COLONOSCOPY  2015    ESOPHAGOGASTRODUODENOSCOPY (EGD) WITH PROPOFOL N/A 11/21/2022   Procedure: ESOPHAGOGASTRODUODENOSCOPY (EGD) WITH PROPOFOL;  Surgeon: Lemar Lofty., MD;  Location: [facility];  Service: Gastroenterology;  Laterality: N/A;   FLEXIBLE SIGMOIDOSCOPY N/A 07/01/2020   Procedure: FLEXIBLE SIGMOIDOSCOPY;  Surgeon: Benancio Deeds, MD;  Location: [facility];  Service: Gastroenterology;  Laterality: N/A;   FLEXIBLE SIGMOIDOSCOPY N/A 01/27/2021   Procedure: FLEXIBLE SIGMOIDOSCOPY;  Surgeon: Benancio Deeds, MD;  Location: [facility];  Service: Gastroenterology;  Laterality: N/A;   FLEXIBLE SIGMOIDOSCOPY N/A 09/07/2021   Procedure: FLEXIBLE SIGMOIDOSCOPY;  Surgeon: Lynann Bologna, MD;  Location: [facility];  Service: Gastroenterology;  Laterality: N/A;   FLEXIBLE SIGMOIDOSCOPY N/A 11/21/2022   Procedure: FLEXIBLE SIGMOIDOSCOPY;  Surgeon: Meridee Score Netty Starring., MD;  Location: [facility];  Service: Gastroenterology;  Laterality: N/A;   NIPPLE SPARING MASTECTOMY Bilateral 11/09/2020   Procedure: BILATERAL NIPPLE SPARING MASTECTOMY;  Surgeon: Emelia Loron, MD;  Location: [facility];  Service: General;  Laterality: Bilateral;   PORT-A-CATH REMOVAL N/A 07/25/2021   Procedure: REMOVAL PORT-A-CATH;  Surgeon: Allena Napoleon, MD;  Location: [facility];  Service: Plastics;  Laterality: N/A;   PORTACATH PLACEMENT N/A 06/23/2020   Procedure: INSERTION PORT-A-CATH;  Surgeon: Emelia Loron, MD;  Location: [facility];  Service: General;  Laterality: N/A;  START TIME OF 11:00 AM FOR 60 MINUTES [place] [facility]   PORTACATH PLACEMENT Left 05/02/2022   Procedure: INSERTION PORT-A-CATH;  Surgeon: Emelia Loron, MD;  Location: [facility];  Service: General;  Laterality: Left;   RADIOACTIVE SEED GUIDED EXCISIONAL BREAST BIOPSY Right 05/02/2022   Procedure: RADIOACTIVE SEED GUIDED RIGHT BREAST MASS EXCISION;  Surgeon: Emelia Loron,  MD;  Location: [facility];  Service: General;  Laterality: Right;   REMOVAL OF BILATERAL TISSUE EXPANDERS WITH PLACEMENT OF BILATERAL BREAST IMPLANTS Bilateral 07/25/2021   Procedure: REMOVAL OF BILATERAL TISSUE EXPANDERS WITH PLACEMENT OF BILATERAL BREAST IMPLANTS;  Surgeon: Allena Napoleon, MD;  Location: [facility];  Service: Plastics;  Laterality: Bilateral;  1.5     A IV Location/Drains/Wounds Patient Lines/Drains/Airways Status     Active Line/Drains/Airways     Name Placement date Placement time Site Days   Implanted Port 05/02/22 Left Chest 05/02/22  1209  Chest  225   Peripheral IV 22 G Anterior;Left Forearm --  --  Forearm  --            Intake/Output Last 24 hours  Intake/Output Summary (Last 24 hours) at 12/13/2022 0358 Last data filed at 12/13/2022 0157 Gross per 24 hour  Intake 2100 ml  Output --  Net 2100 ml    Labs/Imaging Results for orders placed or performed during the hospital encounter of 12/12/22 (from the past 48 hour(s))  Comprehensive metabolic panel     Status: Abnormal   Collection Time: 12/12/22  9:30 PM  Result Value Ref Range   Sodium 134 (L) 135 - 145 mmol/L   Potassium 3.2 (L) 3.5 - 5.1 mmol/L   Chloride 102 98 - 111 mmol/L   CO2 19 (L) 22 - 32 mmol/L   Glucose, Bld 115 (H) 70 -  99 mg/dL    Comment: Glucose reference range applies only to samples taken after fasting for at least 8 hours.   BUN 13 6 - 20 mg/dL   Creatinine, Ser 8.65 0.44 - 1.00 mg/dL   Calcium 7.7 (L) 8.9 - 10.3 mg/dL   Total Protein 6.5 6.5 - 8.1 g/dL   Albumin 2.3 (L) 3.5 - 5.0 g/dL   AST 16 15 - 41 U/L   ALT 17 0 - 44 U/L   Alkaline Phosphatase 138 (H) 38 - 126 U/L   Total Bilirubin 0.8 0.3 - 1.2 mg/dL   GFR, Estimated >78 >46 mL/min    Comment: (NOTE) Calculated using the CKD-EPI Creatinine Equation (2021)    Anion gap 13 5 - 15    Comment: Performed at [facility], [address], [place], [place] [address]   Lipase, blood     Status: None   Collection Time: 12/12/22  9:30 PM  Result Value Ref Range   Lipase 19 11 - 51 U/L    Comment: Performed at [facility], [address], [place], [place] [address]  CBC with Diff     Status: Abnormal   Collection Time: 12/12/22  9:30 PM  Result Value Ref Range   WBC 10.8 (H) 4.0 - 10.5 K/uL   RBC 4.17 3.87 - 5.11 MIL/uL   Hemoglobin 10.7 (L) 12.0 - 15.0 g/dL   HCT 24.4 (L) 01.0 - 27.2 %   MCV 82.5 80.0 - 100.0 fL   MCH 25.7 (L) 26.0 - 34.0 pg   MCHC 31.1 30.0 - 36.0 g/dL   RDW 53.6 (H) 64.4 - 03.4 %   Platelets 505 (H) 150 - 400 K/uL   nRBC 0.2 0.0 - 0.2 %   Neutrophils Relative % 75 %   Neutro Abs 8.1 (H) 1.7 - 7.7 K/uL   Lymphocytes Relative 21 %   Lymphs Abs 2.3 0.7 - 4.0 K/uL   Monocytes Relative 2 %   Monocytes Absolute 0.3 0.1 - 1.0 K/uL   Eosinophils Relative 0 %   Eosinophils Absolute 0.0 0.0 - 0.5 K/uL   Basophils Relative 1 %   Basophils Absolute 0.1 0.0 - 0.1 K/uL   WBC Morphology INCREASED BANDS (>20% BANDS)    Immature Granulocytes 1 %   Abs Immature Granulocytes 0.07 0.00 - 0.07 K/uL   Burr Cells PRESENT     Comment: Performed at [facility], [address], [place], [place] [address]   CT ABDOMEN PELVIS WO CONTRAST  Result Date: 12/12/2022 CLINICAL DATA:  Acute nonlocalized abdominal pain. History of Crohn disease. Nausea, vomiting, and diarrhea. EXAM: CT ABDOMEN AND PELVIS WITHOUT CONTRAST TECHNIQUE: Multidetector CT imaging of the abdomen and pelvis was performed following the standard protocol without IV contrast. RADIATION DOSE REDUCTION: This exam was performed according to the departmental dose-optimization program which includes automated exposure control, adjustment of the mA and/or kV according to patient size and/or use of iterative reconstruction technique. COMPARISON:  11/18/2022 FINDINGS: Lower chest: Lung bases are clear. Left breast implant. Presumed right mastectomy.  Hepatobiliary: No focal liver abnormality is seen. No gallstones, gallbladder wall thickening, or biliary dilatation. Pancreas: Unremarkable. No pancreatic ductal dilatation or surrounding inflammatory changes. Spleen: Normal in size without focal abnormality. Adrenals/Urinary Tract: Adrenal glands are unremarkable. Kidneys are normal, without renal calculi, focal lesion, or hydronephrosis. Bladder is unremarkable. Stomach/Bowel: Postoperative subtotal colectomy with surgical anastomosis in the left lower quadrant. There are markedly dilated distal loops of small  bowel with air-fluid levels and diffusely thickened wall. This could indicate chronic obstruction and possibly indicates anastomotic or inflammatory stricture. Appearance is similar to prior study. Remainder of the small bowel is decompressed. Vascular/Lymphatic: No significant vascular findings are present. No enlarged abdominal or pelvic lymph nodes. Reproductive: Enlarged uterus with myometrial calcifications consistent with fibroids. No abnormal adnexal masses. Other: Small amount of free fluid in the pelvis. No free air. Abdominal wall musculature appears intact. Musculoskeletal: No acute or significant osseous findings. IMPRESSION: 1. No significant change in appearance since prior examination. 2. Again, there is postoperative change with subtotal colectomy and markedly dilated distal small bowel. Dilated small bowel containing air-fluid levels consistent with liquid stool and have diffuse wall thickening. Changes may represent chronic obstruction possibly due to anastomotic or inflammatory stricture. Bowel wall thickening suggests inflammation consistent with history of Crohn's. No significant change since prior study. 3. Calcified uterine fibroids. 4. Small amount of free fluid in the pelvis is likely reactive. Electronically Signed   By: Burman Nieves M.D.   On: 12/12/2022 22:54    Pending Labs Unresulted Labs (From admission, onward)      Start     Ordered   12/12/22 2032  Urinalysis, Routine w reflex microscopic -Urine, Clean Catch  Once,   URGENT       Question:  Specimen Source  Answer:  Urine, Clean Catch   12/12/22 2031   Signed and Held  Comprehensive metabolic panel  Tomorrow morning,   R        Signed and Held   Signed and Held  Magnesium  Tomorrow morning,   R        Signed and Held            Vitals/Pain Today's Vitals   12/13/22 0330 12/13/22 0338 12/13/22 0345 12/13/22 0353  BP: (!) 113/95     Pulse: (!) 138 (!) 136 (!) 137 (!) 111  Resp: 12 11 11 13   Temp:      TempSrc:      SpO2: 96% 97% 97% 97%  PainSc:        Isolation Precautions No active isolations  Medications Medications  lactated ringers infusion ( Intravenous New Bag/Given 12/13/22 0056)  methylPREDNISolone sodium succinate (SOLU-MEDROL) 125 mg/2 mL injection 125 mg (125 mg Intravenous Patient Refused/Not Given 12/13/22 0000)  lactated ringers bolus 2,000 mL (0 mLs Intravenous Stopped 12/13/22 0106)  morphine (PF) 4 MG/ML injection 6 mg (6 mg Intravenous Given 12/12/22 2151)  pantoprazole (PROTONIX) injection 40 mg (40 mg Intravenous Given 12/12/22 2340)  potassium chloride 10 mEq in 100 mL IVPB (0 mEq Intravenous Stopped 12/13/22 0157)  morphine (PF) 4 MG/ML injection 4 mg (4 mg Intravenous Given 12/12/22 2335)  morphine (PF) 4 MG/ML injection 4 mg (4 mg Intravenous Given 12/13/22 0249)    Mobility walks     Focused Assessments GI - pt c/o burning epigastric pain. Had nausea/vomiting/diarrhea at home. Hx crohns.   R Recommendations: See Admitting Provider Note  Report given to:   Additional Notes: Pt refused IV steroids in ED. ED doctor was notified.

## 2022-12-13 NOTE — Procedures (Signed)
Central Venous Catheter Insertion Procedure Note  [name]  [id number]  April 13, 1975  Date:12/13/22  Time:10:44 AM   Provider Performing:[name]  [name]   Procedure: Insertion of Non-tunneled Central Venous [phone]) with US guidance ([id number])   Indication(s) Medication administration  Consent Risks of the procedure as well as the alternatives and risks of each were explained to the patient and/or caregiver.  Consent for the procedure was obtained and is signed in the bedside chart family consent obtained by Dr. Katrinka Blazing  Anesthesia Topical only with 1% lidocaine   Timeout Verified patient identification, verified procedure, site/side was marked, verified correct patient position, special equipment/implants available, medications/allergies/relevant history reviewed, required imaging and test results available.  Sterile Technique Maximal sterile technique including full sterile barrier drape, hand hygiene, sterile gown, sterile gloves, mask, hair covering, sterile ultrasound probe cover (if used).  Procedure Description Area of catheter insertion was cleaned with chlorhexidine and draped in sterile fashion.  With real-time ultrasound guidance a central venous catheter was placed into the right femoral vein. Nonpulsatile blood flow and easy flushing noted in all ports.  The catheter was sutured in place and sterile dressing applied.  Complications/Tolerance None; patient tolerated the procedure well. Chest X-ray is ordered to verify placement for internal jugular or subclavian cannulation.   Chest x-ray is not ordered for femoral cannulation.  EBL Minimal  Specimen(s) None

## 2022-12-13 NOTE — Progress Notes (Signed)
RT NOTE:  Pt transported from ED to CT to ICU via ventilator with no complications noted. BMV available at pt bedside throughout transport.

## 2022-12-13 NOTE — Progress Notes (Signed)
*  PRELIMINARY RESULTS* Echocardiogram 2D Echocardiogram has been performed.  [name] 12/13/2022, 12:46 PM

## 2022-12-13 NOTE — ED Notes (Addendum)
Dr. Imogene Burn notified that HR continues to be elevated in the 130s after 2L of IV fluid and pain under control. No new orders at this time. Consulting civil engineer notified.

## 2022-12-14 ENCOUNTER — Encounter (HOSPITAL_COMMUNITY): Payer: Self-pay | Admitting: Internal Medicine

## 2022-12-14 ENCOUNTER — Encounter (HOSPITAL_COMMUNITY)
Admission: EM | Disposition: A | Discharge status: Nursing Home (Includes ICF) | Payer: Self-pay | Source: Home / Self Care | Attending: Internal Medicine

## 2022-12-14 ENCOUNTER — Ambulatory Visit: Payer: Managed Care, Other (non HMO) | Admitting: Internal Medicine

## 2022-12-14 ENCOUNTER — Inpatient Hospital Stay (HOSPITAL_COMMUNITY): Payer: Managed Care, Other (non HMO)

## 2022-12-14 ENCOUNTER — Encounter: Payer: Self-pay | Admitting: *Deleted

## 2022-12-14 DIAGNOSIS — K56609 Unspecified intestinal obstruction, unspecified as to partial versus complete obstruction: Secondary | ICD-10-CM | POA: Diagnosis not present

## 2022-12-14 DIAGNOSIS — A419 Sepsis, unspecified organism: Secondary | ICD-10-CM

## 2022-12-14 DIAGNOSIS — M79A3 Nontraumatic compartment syndrome of abdomen: Secondary | ICD-10-CM

## 2022-12-14 DIAGNOSIS — R6521 Severe sepsis with septic shock: Secondary | ICD-10-CM

## 2022-12-14 DIAGNOSIS — R609 Edema, unspecified: Secondary | ICD-10-CM | POA: Diagnosis not present

## 2022-12-14 DIAGNOSIS — K5010 Crohn's disease of large intestine without complications: Secondary | ICD-10-CM | POA: Diagnosis not present

## 2022-12-14 HISTORY — PX: LAPAROTOMY: SHX154

## 2022-12-14 LAB — CBC
HCT: 34.7 % — ABNORMAL LOW (ref 36.0–46.0)
HCT: 36.7 % (ref 36.0–46.0)
HCT: 37.5 % (ref 36.0–46.0)
Hemoglobin: 11.7 g/dL — ABNORMAL LOW (ref 12.0–15.0)
Hemoglobin: 12.6 g/dL (ref 12.0–15.0)
Hemoglobin: 12.7 g/dL (ref 12.0–15.0)
MCH: 27.7 pg (ref 26.0–34.0)
MCH: 27.7 pg (ref 26.0–34.0)
MCH: 28.6 pg (ref 26.0–34.0)
MCHC: 33.7 g/dL (ref 30.0–36.0)
MCHC: 33.9 g/dL (ref 30.0–36.0)
MCHC: 34.3 g/dL (ref 30.0–36.0)
MCV: 81.9 fL (ref 80.0–100.0)
MCV: 82.2 fL (ref 80.0–100.0)
MCV: 83.4 fL (ref 80.0–100.0)
Platelets: 179 10*3/uL (ref 150–400)
Platelets: 195 10*3/uL (ref 150–400)
Platelets: 213 10*3/uL (ref 150–400)
RBC: 4.22 MIL/uL (ref 3.87–5.11)
RBC: 4.4 MIL/uL (ref 3.87–5.11)
RBC: 4.58 MIL/uL (ref 3.87–5.11)
RDW: 18.3 % — ABNORMAL HIGH (ref 11.5–15.5)
RDW: 18.6 % — ABNORMAL HIGH (ref 11.5–15.5)
RDW: 18.7 % — ABNORMAL HIGH (ref 11.5–15.5)
WBC: 13.4 10*3/uL — ABNORMAL HIGH (ref 4.0–10.5)
WBC: 18.1 10*3/uL — ABNORMAL HIGH (ref 4.0–10.5)
WBC: 19.7 10*3/uL — ABNORMAL HIGH (ref 4.0–10.5)
nRBC: 0.6 % — ABNORMAL HIGH (ref 0.0–0.2)
nRBC: 0.6 % — ABNORMAL HIGH (ref 0.0–0.2)
nRBC: 0.7 % — ABNORMAL HIGH (ref 0.0–0.2)

## 2022-12-14 LAB — COMPREHENSIVE METABOLIC PANEL
ALT: 70 U/L — ABNORMAL HIGH (ref 0–44)
AST: 123 U/L — ABNORMAL HIGH (ref 15–41)
Albumin: 2.2 g/dL — ABNORMAL LOW (ref 3.5–5.0)
Alkaline Phosphatase: 92 U/L (ref 38–126)
Anion gap: 13 (ref 5–15)
BUN: 31 mg/dL — ABNORMAL HIGH (ref 6–20)
CO2: 14 mmol/L — ABNORMAL LOW (ref 22–32)
Calcium: 7.7 mg/dL — ABNORMAL LOW (ref 8.9–10.3)
Chloride: 100 mmol/L (ref 98–111)
Creatinine, Ser: 1.95 mg/dL — ABNORMAL HIGH (ref 0.44–1.00)
GFR, Estimated: 32 mL/min — ABNORMAL LOW (ref >60)
Glucose, Bld: 147 mg/dL — ABNORMAL HIGH (ref 70–99)
Potassium: 4.7 mmol/L (ref 3.5–5.1)
Sodium: 127 mmol/L — ABNORMAL LOW (ref 135–145)
Total Bilirubin: 1.8 mg/dL — ABNORMAL HIGH (ref 0.3–1.2)
Total Protein: 5.6 g/dL — ABNORMAL LOW (ref 6.5–8.1)

## 2022-12-14 LAB — GLUCOSE, CAPILLARY
Glucose-Capillary: 102 mg/dL — ABNORMAL HIGH (ref 70–99)
Glucose-Capillary: 129 mg/dL — ABNORMAL HIGH (ref 70–99)
Glucose-Capillary: 134 mg/dL — ABNORMAL HIGH (ref 70–99)
Glucose-Capillary: 181 mg/dL — ABNORMAL HIGH (ref 70–99)
Glucose-Capillary: 185 mg/dL — ABNORMAL HIGH (ref 70–99)
Glucose-Capillary: 54 mg/dL — ABNORMAL LOW (ref 70–99)
Glucose-Capillary: 54 mg/dL — ABNORMAL LOW (ref 70–99)
Glucose-Capillary: 85 mg/dL (ref 70–99)
Glucose-Capillary: 91 mg/dL (ref 70–99)

## 2022-12-14 LAB — BPAM FFP
Blood Product Expiration Date: 202409092359
Blood Product Expiration Date: 202409092359
Blood Product Expiration Date: 202409092359
ISSUE DATE / TIME: 202409041050
ISSUE DATE / TIME: 202409041257
ISSUE DATE / TIME: 202409041446
Unit Type and Rh: 7300
Unit Type and Rh: 7300
Unit Type and Rh: 7300

## 2022-12-14 LAB — PREPARE FRESH FROZEN PLASMA
Unit division: 0
Unit division: 0
Unit division: 0

## 2022-12-14 LAB — PREALBUMIN: Prealbumin: 11 mg/dL — ABNORMAL LOW (ref 18–38)

## 2022-12-14 LAB — PROTIME-INR
INR: 1.3 — ABNORMAL HIGH (ref 0.8–1.2)
Prothrombin Time: 16.6 s — ABNORMAL HIGH (ref 11.4–15.2)

## 2022-12-14 LAB — LACTIC ACID, PLASMA: Lactic Acid, Venous: 3.8 mmol/L (ref 0.5–1.9)

## 2022-12-14 LAB — MAGNESIUM: Magnesium: 2.4 mg/dL (ref 1.7–2.4)

## 2022-12-14 SURGERY — LAPAROTOMY, EXPLORATORY
Anesthesia: General | Site: Abdomen

## 2022-12-14 MED ORDER — PHENYLEPHRINE HCL-NACL 20-0.9 MG/250ML-% IV SOLN
INTRAVENOUS | Status: DC | PRN
Start: 2022-12-14 — End: 2022-12-14
  Administered 2022-12-14: 30 ug/min via INTRAVENOUS

## 2022-12-14 MED ORDER — FENTANYL CITRATE (PF) 100 MCG/2ML IJ SOLN
INTRAMUSCULAR | Status: DC | PRN
  Administered 2022-12-14: 50 ug via INTRAVENOUS

## 2022-12-14 MED ORDER — NOREPINEPHRINE 16 MG/250ML-% IV SOLN
0.0000 ug/min | INTRAVENOUS | Status: DC
  Administered 2022-12-14: 10 ug/min via INTRAVENOUS
  Administered 2022-12-15: 7 ug/min via INTRAVENOUS
  Filled 2022-12-14 (×2): qty 250

## 2022-12-14 MED ORDER — DEXTROSE IN LACTATED RINGERS 5 % IV SOLN: INTRAVENOUS | Status: AC

## 2022-12-14 MED ORDER — ROCURONIUM BROMIDE 100 MG/10ML IV SOLN
INTRAVENOUS | Status: DC | PRN
  Administered 2022-12-14: 100 mg via INTRAVENOUS

## 2022-12-14 MED ORDER — ALBUMIN HUMAN 5 % IV SOLN
INTRAVENOUS | Status: AC
  Filled 2022-12-14: qty 250

## 2022-12-14 MED ORDER — PROPOFOL 10 MG/ML IV BOLUS
INTRAVENOUS | Status: AC
  Filled 2022-12-14: qty 20

## 2022-12-14 MED ORDER — DEXTROSE 50 % IV SOLN
INTRAVENOUS | Status: AC
  Filled 2022-12-14: qty 50

## 2022-12-14 MED ORDER — LACTATED RINGERS IV SOLN: INTRAVENOUS | Status: DC | PRN

## 2022-12-14 MED ORDER — DEXTROSE 50 % IV SOLN
25.0000 g | INTRAVENOUS | Status: AC
  Administered 2022-12-14: 25 g via INTRAVENOUS

## 2022-12-14 MED ORDER — FENTANYL CITRATE (PF) 100 MCG/2ML IJ SOLN
INTRAMUSCULAR | Status: AC
  Filled 2022-12-14: qty 2

## 2022-12-14 MED ORDER — ALBUMIN HUMAN 5 % IV SOLN
INTRAVENOUS | Status: DC | PRN
Start: 2022-12-14 — End: 2022-12-14

## 2022-12-14 MED ORDER — LACTATED RINGERS IV BOLUS
1000.0000 mL | Freq: Once | INTRAVENOUS | Status: AC
  Administered 2022-12-14: 1000 mL via INTRAVENOUS

## 2022-12-14 MED ORDER — 0.9 % SODIUM CHLORIDE (POUR BTL) OPTIME
TOPICAL | Status: DC | PRN
Start: 2022-12-14 — End: 2022-12-14
  Administered 2022-12-14: 8000 mL

## 2022-12-14 MED ORDER — LIDOCAINE HCL (PF) 2 % IJ SOLN
INTRAMUSCULAR | Status: AC
  Filled 2022-12-14: qty 5

## 2022-12-14 SURGICAL SUPPLY — 38 items
APL PRP STRL LF DISP 70% ISPRP (MISCELLANEOUS)
BAG COUNTER SPONGE SURGICOUNT (BAG) IMPLANT
BAG SPNG CNTER NS LX DISP (BAG)
CHLORAPREP W/TINT 26 (MISCELLANEOUS) IMPLANT
COVER MAYO STAND STRL (DRAPES) ×1 IMPLANT
COVER SURGICAL LIGHT HANDLE (MISCELLANEOUS) ×1 IMPLANT
DRAIN CHANNEL 19F RND (DRAIN) IMPLANT
DRAIN RELI 100 BL SUC LF ST (DRAIN)
DRAPE LAPAROSCOPIC ABDOMINAL (DRAPES) ×1 IMPLANT
DRSG OPSITE POSTOP 4X10 (GAUZE/BANDAGES/DRESSINGS) IMPLANT
DRSG OPSITE POSTOP 4X6 (GAUZE/BANDAGES/DRESSINGS) IMPLANT
DRSG OPSITE POSTOP 4X8 (GAUZE/BANDAGES/DRESSINGS) IMPLANT
ELECT REM PT RETURN 15FT ADLT (MISCELLANEOUS) ×1 IMPLANT
EVACUATOR SILICONE 100CC (DRAIN) IMPLANT
GLOVE BIO SURGEON STRL SZ 6 (GLOVE) ×2 IMPLANT
GLOVE INDICATOR 6.5 STRL GRN (GLOVE) ×2 IMPLANT
GOWN STRL REUS W/ TWL XL LVL3 (GOWN DISPOSABLE) ×1 IMPLANT
GOWN STRL REUS W/TWL XL LVL3 (GOWN DISPOSABLE) ×1
KIT TURNOVER KIT A (KITS) IMPLANT
LIGASURE IMPACT 36 18CM CVD LR (INSTRUMENTS) IMPLANT
PACK GENERAL/GYN (CUSTOM PROCEDURE TRAY) ×1 IMPLANT
RELOAD PROXIMATE 75MM BLUE (ENDOMECHANICALS) ×1 IMPLANT
RELOAD STAPLE 75 3.8 BLU REG (ENDOMECHANICALS) IMPLANT
STAPLER PROXIMATE 75MM BLUE (STAPLE) IMPLANT
STAPLER VISISTAT 35W (STAPLE) IMPLANT
SUT ETHILON 3 0 PS 1 (SUTURE) IMPLANT
SUT MNCRL AB 4-0 PS2 18 (SUTURE) IMPLANT
SUT NOVA T20/GS 25 (SUTURE) IMPLANT
SUT SILK 2 0 (SUTURE) ×1
SUT SILK 2 0 SH CR/8 (SUTURE) ×1 IMPLANT
SUT SILK 2-0 18XBRD TIE 12 (SUTURE) ×1 IMPLANT
SUT SILK 3 0 (SUTURE) ×1
SUT SILK 3 0 SH CR/8 (SUTURE) ×1 IMPLANT
SUT SILK 3-0 18XBRD TIE 12 (SUTURE) ×1 IMPLANT
TOWEL OR 17X26 10 PK STRL BLUE (TOWEL DISPOSABLE) IMPLANT
TOWEL OR NON WOVEN STRL DISP B (DISPOSABLE) ×1 IMPLANT
TRAY FOLEY MTR SLVR 14FR STAT (SET/KITS/TRAYS/PACK) ×1 IMPLANT
TRAY FOLEY MTR SLVR 16FR STAT (SET/KITS/TRAYS/PACK) ×1 IMPLANT

## 2022-12-14 NOTE — Consult Note (Signed)
Consultation Note Date: 12/14/2022   Patient Name: [name]  DOB: 04-09-1976  MRN: [id number]  Age / Sex: 47 y.o., female  PCP: Courtney Bowl, MD Referring Physician: Coralyn Helling, MD  Reason for Consultation: Establishing goals of care  HPI/Patient Profile: 47 y.o. female admitted on 12/12/2022  .   Clinical Assessment and Goals of Care: 47 year old lady status post cardiac arrest, ileosigmoid stricture with bowel dilation with CT scan showing significant bowel dilation without evidence of free air.  Patient in the ICU, intubated and mechanically ventilated, critically ill on pressors.  Patient seen and evaluated by surgical colleagues and is to undergo ex lap and end ileostomy. Patient has Crohn's disease status post subtotal colectomy with ileosigmoid anastomosis performed in 2021.  She has anastomotic stricture with bowel obstruction.  She is status postcardiac arrest deemed likely secondary to aspiration. 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  Courtney consult for goals of care discussions. Chart reviewed, patient seen, discussed with bedside nursing prior to patient visit.  I find patient's daughter [name] at bedside.  Another family member also present at bedside. [name] is specialized medical care for people living with serious illness. It focuses on providing relief from the symptoms and stress of a serious illness. The goal is to improve quality of life for both the patient and the family. Goals of care: Broad aims of medical therapy in relation to the patient's values and preferences. Our aim is to provide medical care aimed at enabling patients to achieve the goals that matter most to them, given the circumstances of their particular medical situation and  their constraints.  Brief gentle introduction regarding supportive services was provided.  Patient's daughter remains hopeful for stabilization/recovery.  Offered active listening and supportive empathic presence.  Daughter also provided with information that chaplain services also available as an additional resource.   NEXT OF KIN Daughter [name] present at bedside.  SUMMARY OF RECOMMENDATIONS   Full code/full scope of care Monitor hospital course and postop course Courtney will follow peripherally for now, will re-engage based on patient's overall disease trajectory. Thank you for the consult.  Code Status/Advance Care Planning: Full code   Symptom Management:     Courtney Prophylaxis:  Delirium Protocol  Additional Recommendations (Limitations, Scope, Preferences): Full Scope Treatment  Psycho-social/Spiritual:  Desire for further Chaplaincy support:yes Additional Recommendations: Caregiving  Support/Resources  Prognosis:  Unable to determine  Discharge Planning: To Be Determined      Primary Diagnoses: Present on Admission:  Exacerbation of Crohn's disease of large intestine (HCC)  Colonic stricture (HCC)  Hypokalemia  SBO (small bowel obstruction) (HCC)  Crohn's disease of both small and large intestine with intestinal obstruction (HCC)  Small bowel obstruction (HCC)   I have reviewed the medical record, interviewed the patient and family, and examined the patient. The following aspects are pertinent.  Past Medical History:  Diagnosis Date  Breast cancer (HCC)    right   Colon stricture (HCC) 05/07/2019   Crohn's colitis, with intestinal obstruction (HCC) 09/17/2013   Dx 2006 - right and left colon involved 2011 - pan-colitis 09/17/2013 left colitis 60-20 cm March 2021 subtotal colectomy ileosigmoid anastomosis for colonic strictures-Dr. Maisie Norris     Crohn's disease [facility])    Family history of breast cancer 06/11/2020   Iron deficiency anemia  secondary to blood loss (chronic) - Crohn's colitis 10/17/2010   Rectal bleeding    Uterine fibroid    Vitamin D deficiency 09/24/2013   Social History   Socioeconomic History   Marital status: Single    Spouse name: Not on file   Number of children: 1   Years of education: 13   Highest education level: Not on file  Occupational History   Occupation: Medical Coder   Tobacco Use   Smoking status: Former    Types: Cigarettes   Smokeless tobacco: Never  Vaping Use   Vaping status: Never Used  Substance and Sexual Activity   Alcohol use: Yes    Alcohol/week: 1.0 - 2.0 standard drink of alcohol    Types: 1 - 2 Glasses of wine per week   Drug use: No   Sexual activity: Yes    Partners: Male    Comment: Intermittent protection not consistent  Other Topics Concern   Not on file  Social History Narrative   Single, 1 daughter born 2001 approximately   Medical coder for [employer]   She is a former smoker, occasional alcohol no drug use   Social Determinants of Corporate investment banker Strain: Not on file  Food Insecurity: No Food Insecurity (11/21/2022)   Hunger Vital Sign    Worried About Running Out of Food in the Last Year: Never true    Ran Out of Food in the Last Year: Never true  Transportation Needs: No Transportation Needs (11/21/2022)   PRAPARE - Administrator, Civil Service (Medical): No    Lack of Transportation (Non-Medical): No  Physical Activity: Not on file  Stress: Not on file  Social Connections: Not on file   Family History  Problem Relation Age of Onset   Diabetes Father    Breast cancer Maternal Aunt        dx unknown age   Diabetes Paternal Aunt    Breast cancer Cousin        maternal cousin; dx unknown age   Esophageal cancer Neg Hx    Rectal cancer Neg Hx    Stomach cancer Neg Hx    Colon polyps Neg Hx    Colon cancer Neg Hx    Scheduled Meds:  [MAR Hold] Chlorhexidine Gluconate Cloth  6 each Topical Q2200   [MAR Hold] insulin  aspart  0-9 Units Subcutaneous Q4H   [MAR Hold] methylPREDNISolone (SOLU-MEDROL) injection  60 mg Intravenous Q24H   [MAR Hold] mouth rinse  15 mL Mouth Rinse Q2H   [MAR Hold] pantoprazole (PROTONIX) IV  40 mg Intravenous Q24H   Continuous Infusions:  dexmedetomidine (PRECEDEX) IV infusion 0.4 mcg/kg/hr (12/14/22 1251)   lactated ringers 125 mL/hr at 12/14/22 1053   midazolam 2 mg/hr (12/14/22 1000)   [MAR Hold] norepinephrine (LEVOPHED) Adult infusion 14 mcg/min (12/14/22 1251)   [MAR Hold] piperacillin-tazobactam (ZOSYN)  IV 0 g (12/14/22 0930)   vasopressin 0.04 Units/min (12/14/22 1251)   PRN Meds:.[DISCONTINUED] acetaminophen **OR** [MAR Hold] acetaminophen, [MAR Hold] fentaNYL (SUBLIMAZE) injection, [MAR Hold] fentaNYL (SUBLIMAZE) injection, [DISCONTINUED]  ondansetron **OR** [MAR Hold] ondansetron (ZOFRAN) IV, [MAR Hold] mouth rinse Medications Prior to Admission:  Prior to Admission medications   Medication Sig Start Date End Date Taking? Authorizing Provider  acetaminophen (TYLENOL) 500 MG tablet Take 500-1,000 mg by mouth every 6 (six) hours as needed for mild pain.   Yes [provider]  cholecalciferol (VITAMIN D3) 25 MCG (1000 UNIT) tablet Take 1,000 Units by mouth daily.   Yes [provider]  hyoscyamine (LEVSIN SL) 0.125 MG SL tablet Place 1 tablet (0.125 mg total) under the tongue every 6 (six) hours as needed for cramping. 11/23/22  Yes Standley Brooking, MD  Multiple Vitamins-Minerals (MULTIVITAMIN WITH MINERALS) tablet Take 1 tablet by mouth daily.   Yes [provider]  nystatin (MYCOSTATIN) 100000 UNIT/ML suspension Take 5 mLs (500,000 Units total) by mouth 4 (four) times daily. 11/23/22  Yes Standley Brooking, MD  potassium chloride SA (KLOR-CON M) 20 MEQ tablet Take 1 tablet (20 mEq total) by mouth daily. 12/27/21  Yes Iva Boop, MD  predniSONE (DELTASONE) 20 MG tablet Take 2 tablets (40 mg total) by mouth daily with breakfast for 7 days,  THEN 1.5 tablets (30 mg total) daily with breakfast for 7 days, THEN 1 tablet (20 mg total) daily with breakfast for 21 days. 11/24/22 12/29/22 Yes Standley Brooking, MD  simethicone (MYLICON) 80 MG chewable tablet Chew 1 tablet (80 mg total) by mouth 4 (four) times daily as needed for flatulence. 11/23/22  Yes Standley Brooking, MD   Allergies  Allergen Reactions   Nsaids Other (See Comments)    Crohns disease/ IBD   Other     BLOOD PRODUCT REFUSAL   Review of Systems Intubated and mechanically ventilated Physical Exam Intubated and mechanically ventilated Monitor noted Tachycardia  Vital Signs: BP 130/80   Pulse 95   Temp 99.5 F (37.5 C)   Resp (!) 36   Ht 5\' 4"  (1.626 m)   Wt 56.8 kg   LMP 04/24/2021   SpO2 92%   BMI 21.49 kg/m  Pain Scale: CPOT   Pain Score: 2    SpO2: SpO2: 92 % O2 Device:SpO2: 92 % O2 Flow Rate: .   IO: Intake/output summary:  Intake/Output Summary (Last 24 hours) at 12/14/2022 1347 Last data filed at 12/14/2022 1332 Gross per 24 hour  Intake 6360.91 ml  Output 255 ml  Net 6105.91 ml    LBM: Last BM Date :  (pta) Baseline Weight: Weight: 56.8 kg Most recent weight: Weight: 56.8 kg     Courtney Assessment/Data:   Courtney performance scale 40%  Time In: 0930 Time Out: 10 3 0 Time Total: 60 Greater than 50%  of this time was spent counseling and coordinating care related to the above assessment and plan.  Signed by: Rosalin Hawking, MD   Please contact [name] Team phone at [phone] for questions and concerns.  For individual provider: See Loretha Stapler

## 2022-12-14 NOTE — Op Note (Signed)
Post-Op Note/Post-Procedure Note  Patient: [name] MRN: [id number] DOB: [birth year] Sex: female Operation/Procedure Date: 12/14/2022 Surgeons and Role:    * Emaline Karnes, Lucilla Edin, MD - Primary    * Romie Levee, MD - Assisting  Pre-operative Diagnoses: intestional stricture Postoperative Diagnoses: internal hernia  Procedure:    EXPLORATORY LAPAROTOMY WITH SMALL BOWEL RESECTION CPT(R) Code:  86578 - PR EXPLORATORY LAPAROTOMY CELIOTOMY W/WO BIOPSY SPX  Anesthesia: General endotracheal anesthesia  Indications: [name] is a 47 y.o. year old female with a history of Crohn's disease with a known ileosigmoid stricture. The day prior to surgery she arrested while in the ED. A CT at that time showed significant bowel dilation without evidence of ischemia or perforation.  She was intubated and taken to the ICU for resuscitation.  For the next 24 hours she remained critically ill, despite ongoing resuscitation.  Her abdomen became increasingly distended and she was oliguric.  Given her clinical picture, the decision was made to proceed to the OR for exploratory laparotomy.  A long discussion was had with the family's daughter and husband and written consent was obtained.    Operative Findings: Internal hernia resulting in small bowel ischemia.  Approximately 105cm of small bowel remaining after the frankly necrotic portions were excised.    Technique: The patient was positively identified and was taken to the operating room and placed supine on the operating table. A time-out was performed confirming correct patient and procedure. We also confirmed initiation of deep venous thrombosis prophylaxis and wound prophylaxis. After successful induction of general endotracheal anesthesia, the arms were carefully padded. The abdomen was prepped and draped in the usual sterile surgical fashion.  A laparotomy incision was made using a #10 blade and carried down through the subcutaneous tissue  using electrocautery. The fascia was identified and incised. The peritoneum was bulging and was incised sharply with scissors. There was immediate return of succus.  Inspection of the abdomen identified a large amount of significantly dilated small bowel that appeared frankly necrotic. There was a thick fibrous band extending from the mesentery down into the LLQ that appeared to have created a internal hernia.  The band was incised and the bowel freed from the internal hernia.  Given the critical nature of the situation, I called my partner Dr. Maisie Fus into the OR for assistance.  With her assistance, we identified proximal and distal transection points of the necrotic small bowel.  Using the GIA stapler for the bowel and a ligasure for the mesentery, the ischemic portion of the bowel was excised and passed off the field to be sent to pathology.  There was approximately 105cm of remaining small bowel that appeared healthy.  Inspection of the remainder of the abdomen was notable for a healthy appearing sigmoid as well as numerous cysts on the uterus.  The remainder of the abdomen was unremarkable.  We irrigated with 5 liters of warm saline until the effluent ran clear. The abdomen appeared hemostatic.  Given her ongoing need for pressor requirement and findings during the surgery, the decision was made to place an Abethera to allow ongoing resuscitation as well as discussion with the family.  An Abethera wound vac was brought on to the field and applied in a standard fashion.    The patient remained in stable but critical condition throughout the case. She remained intubated and was brought to the ICU by anesthesia.  All sponge, needle, and instrument counts were reported correct at the end of the case.  Estimated Blood Loss: Minimal. Specimens:Small bowel for permanent  Implants: None Drains: None. Complications: * No complications entered in OR log * Condition of the patient: Good, extubated Disposition:  PACU

## 2022-12-14 NOTE — Progress Notes (Signed)
Hypoglycemic Event  CBG: 54   Treatment: D50 50 mL (25 gm)  Symptoms: None  Follow-up CBG: Time:1145 CBG Result:181  Possible Reasons for Event: Inadequate meal intake, Medication regimen:  , and Change in activity  Comments/MD notified:Hoffman, NP and Autry PA notified of patients BS. Hypoglycemia orders entered and D50 administered.     [place] Vela Prose

## 2022-12-14 NOTE — Progress Notes (Signed)
Pt is currently hospitalized in ICU and intubated. Pt does not need SCP appt at this current time.

## 2022-12-14 NOTE — Anesthesia Postprocedure Evaluation (Signed)
Anesthesia Post Note  Patient: [name]  Procedure(s) Performed: EXPLORATORY LAPAROTOMY WITH SMALL BOWEL RESECTION (Abdomen)     Patient location during evaluation: ICU Anesthesia Type: General Level of consciousness: patient remains intubated per anesthesia plan Pain management: pain level controlled Vital Signs Assessment: vitals unstable Respiratory status: patient remains intubated per anesthesia plan Cardiovascular status: unstable and tachycardic Anesthetic complications: no Comments: Patient returned to ICU post op, intubated on Levophed and Vasopressin drips as per pre op.   No notable events documented.                  Genifer Lazenby A.

## 2022-12-14 NOTE — Plan of Care (Signed)
  Problem: Clinical Measurements: Goal: Diagnostic test results will improve Outcome: Progressing Goal: Respiratory complications will improve Outcome: Progressing Goal: Cardiovascular complication will be avoided Outcome: Progressing   Problem: Fluid Volume: Goal: Ability to maintain a balanced intake and output will improve Outcome: Not Progressing   Problem: Tissue Perfusion: Goal: Adequacy of tissue perfusion will improve Outcome: Not Progressing   Problem: Clinical Measurements: Goal: Will remain free from infection Outcome: Not Progressing

## 2022-12-14 NOTE — Transfer of Care (Signed)
Immediate Anesthesia Transfer of Care Note  Patient: [name]  Procedure(s) Performed: EXPLORATORY LAPAROTOMY WITH SMALL BOWEL RESECTION (Abdomen)  Patient Location: ICU  Anesthesia Type:General  Level of Consciousness: Patient remains intubated per anesthesia plan  Airway & Oxygen Therapy: Patient remains intubated per anesthesia plan  Post-op Assessment: Report given to RN and Post -op Vital signs reviewed and stable  Post vital signs: Reviewed and stable  Last Vitals:  Vitals Value Taken Time  BP    Temp    Pulse    Resp    SpO2      Last Pain:  Vitals:   12/14/22 0800  TempSrc: Bladder  PainSc:          Complications: No notable events documented.

## 2022-12-14 NOTE — Anesthesia Preprocedure Evaluation (Addendum)
Anesthesia Evaluation  Patient identified by MRN, date of birth, ID band Patient unresponsive    Reviewed: Allergy & Precautions, Patient's Chart, lab work & pertinent test results, Unable to perform ROS - Chart review only  Airway Mallampati: Intubated       Dental   Pulmonary former smoker Intubated on mechanical ventilation   breath sounds clear to auscultation+ rhonchi        Cardiovascular Normal cardiovascular exam Rhythm:Regular Rate:Tachycardia  S/P Cardiac arrest yesterday- currently on Levophed and Vasopressin drips  Echo  12/13/22 1. Windows are challenging, very limited study. Left ventricular ejection  fraction, by estimation, is 50 to 55%. The left ventricle has low normal  function.   2. Right ventricular systolic function is mildly reduced. The right  ventricular size is moderately enlarged.   3. The mitral valve is normal in structure. Trivial mitral valve  regurgitation.   4. Aortic valve regurgitation is not visualized.   5. The inferior vena cava IVC not well visualized.   Conclusion(s)/Recommendation(s): RV appears more dilated than prior study  , recommend assessment for PE.   FINDINGS   Left Ventricle: Windows are challenging, very limited study. Left  ventricular ejection fraction, by estimation, is 50 to 55%. The left  ventricle has low normal function. There is no left ventricular  hypertrophy.   Right Ventricle: The right ventricular size is moderately enlarged. Right  ventricular systolic function is mildly reduced.     Neuro/Psych negative neurological ROS  negative psych ROS   GI/Hepatic Neg liver ROS,,,Elevated LFT's Crohn's disease- intestinal stricture, steroid dependent S/P colon resection   Endo/Other  negative endocrine ROS    Renal/GU ARFRenal diseaseHyponatremia  negative genitourinary   Musculoskeletal negative musculoskeletal ROS (+)    Abdominal   Peds   Hematology  (+) Blood dyscrasia, anemia   Anesthesia Other Findings   Reproductive/Obstetrics                             Anesthesia Physical Anesthesia Plan  ASA: 4 and emergent  Anesthesia Plan: General   Post-op Pain Management: Minimal or no pain anticipated   Induction: Intravenous and Inhalational  PONV Risk Score and Plan: 4 or greater and Treatment may vary due to age or medical condition  Airway Management Planned: Oral ETT  Additional Equipment: Arterial line  Intra-op Plan:   Post-operative Plan: Post-operative intubation/ventilation  Informed Consent: I have reviewed the patients History and Physical, chart, labs and discussed the procedure including the risks, benefits and alternatives for the proposed anesthesia with the patient or authorized representative who has indicated his/her understanding and acceptance.     Dental advisory given  Plan Discussed with: CRNA and Anesthesiologist  Anesthesia Plan Comments:         Anesthesia Quick Evaluation

## 2022-12-14 NOTE — Progress Notes (Signed)
Hypoglycemic Event  CBG: 54   Treatment: D50 50 mL (25 gm)  Symptoms: None  Follow-up CBG: Time:1625 CBG Result:185    Possible Reasons for Event: Inadequate meal intake and Change in activity  Comments/MD notified: Hoffman NP Notified, amp given, D5LR ordered for continuous coverage    Scot Dock, RN

## 2022-12-14 NOTE — Progress Notes (Signed)
Daily Progress Note  DOA: 12/12/2022 Hospital Day: 3  Chief Complaint: Abdominal pain, hx of Crohns  ASSESSMENT & PLAN   Brief Narrative:  [name] is a 47 y.o. year old female with a history of Crohn's disease s/p subtotal colectomy in 2021 recurrent obstructions, colonic stricture, IDA who presented to the emergency department on 12/12/2022 with abdominal pain, nausea, vomiting, diarrhea. In the ED tachycardic, hypertensive. ED provider notes abdominal distention. She was given 2L IVF, morphine, IV PPI. Labs with WBC 10.8, Hgb 10.7, K 3.2. CT AP was ordered and showed markedly dilated distal small bowel consistent with chronic obstruction. No changes since prior study. Patient received Entyvio infusion on 8/28 and tapering her oral prednisone. Patient refused IV steroids in ER. They consulted GI for input.   Crohn's disease, s/p subtotal colectomy with ileal colonic anastomosis narrowing, on Entyvio and prednisone taper. Patient was most recently admitted into hospital on 8/11 with recurrent SBO.CTAP with contrast 11/18/2022 showed evidence of past subtotal colectomy with marked long segment small bowel dilation demonstrating mild mural thickening with abrupt caliber transition near the ileosigmoid anastomosis, suspicious for SBO.  Flex sig on 8/13 showed severe stenosis in the ileosigmoid anastomosis. She was discharged home on 8/15 with Prednisone taper with plans to restart Entyvio. She received her first infusion of Entyvio on 8/28.  She had been tapering her prednisone and down to 20 mg QD, and readmitted for severe abdominal pain. NG tube was placed in ER yesterday. 250cc output reported by RN overnight. Repeat CT A/P showed worsening severe small bowel dilatation noted with air-fluid levels concerning for distal SBO. Overnight increasing pressor requirements - added vaso.  -NGT to LIWS, NPO -Surgery this AM consenting to take for ex lap, ostomy.   Sepsis/PEA arrest, coded in ED  and now intubated in ICU. Increasing WBC-10.8> 18.1>19.7. Elevated Lactic acid 12.0>3.8.Marland Kitchen CBC stable - Hgb 10.7>5.8>11.7. On Zosyn IV.   Acute on chronic anemia Hgb stable at 10.7>5.8. Her baseline is 10-11.History of IDA. Last Iron 22 (11/20/22). Transferred to ICU. Given 3 PRBCs and 3 FFP last night.   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 -managed by oncology and Dr. Trudee Kuster.   Subjective   Patient unable to participate in subjective history. Patient's former spouse and daughter at bedside. Per RN 250cc from NG tube overnight.  Objective   ABDOMEN - 1 VIEW 9/4  IMPRESSION: 1. Tip and side port of the enteric tube below the diaphragm in the stomach. 2. Diffuse gaseous bowel distension without significant interval change.  Recent Labs    12/14/22 0013 12/14/22 0329 12/14/22 0718  WBC 13.4* 18.1* 19.7*  HGB 12.6 12.7 11.7*  HCT 36.7 37.5 34.7*  PLT 213 195 179   BMET Recent Labs    12/13/22 0827 12/13/22 2155 12/14/22 0445  NA 137 134* 127*  K 4.3 4.7 4.7  CL 108 106 100  CO2 14* 16* 14*  GLUCOSE 232* 116* 147*  BUN 21* 26* 31*  CREATININE 1.64* 1.92* 1.95*  CALCIUM 10.2 8.0* 7.7*   LFT Recent Labs    12/14/22 0445  PROT 5.6*  ALBUMIN 2.2*  AST 123*  ALT 70*  ALKPHOS 92  BILITOT 1.8*   PT/INR Recent Labs    12/13/22 1038 12/14/22 0445  LABPROT 17.4* 16.6*  INR 1.4* 1.3*   Imaging:  DG Chest 1 View CLINICAL DATA:  Positioning of  the ETT  EXAM: CHEST  1 VIEW  COMPARISON:  12/13/2022  FINDINGS: Opacity in the left base consistent with layering effusion or consolidation. Expiratory view was obtained with vascular crowding. No pneumothorax identified.  Incidental note is made of distended loops of bowel.  Endotracheal tube tip just below thoracic inlet. Left-sided Port-A-Cath tip distal SVC. NG tube tip superimposed with  stomach.  IMPRESSION: Persistent left basilar consolidation or layering effusion. Expiratory view with vascular crowding.  Electronically Signed   By: Layla Maw M.D.   On: 12/14/2022 08:26     Scheduled inpatient medications:   Chlorhexidine Gluconate Cloth  6 each Topical Q2200   insulin aspart  0-9 Units Subcutaneous Q4H   methylPREDNISolone (SOLU-MEDROL) injection  60 mg Intravenous Q24H   mouth rinse  15 mL Mouth Rinse Q2H   pantoprazole (PROTONIX) IV  40 mg Intravenous Q24H   Continuous inpatient infusions:   dexmedetomidine (PRECEDEX) IV infusion 0.3 mcg/kg/hr (12/14/22 1610)   lactated ringers 125 mL/hr at 12/14/22 0514   midazolam 1.5 mg/hr (12/14/22 9604)   norepinephrine (LEVOPHED) Adult infusion     piperacillin-tazobactam (ZOSYN)  IV 12.5 mL/hr at 12/14/22 0638   vasopressin 0.04 Units/min (12/14/22 0638)   PRN inpatient medications: [DISCONTINUED] acetaminophen **OR** acetaminophen, fentaNYL (SUBLIMAZE) injection, fentaNYL (SUBLIMAZE) injection, [DISCONTINUED] ondansetron **OR** ondansetron (ZOFRAN) IV, mouth rinse  Vital signs in last 24 hours: Temp:  [93.7 F (34.3 C)-100.9 F (38.3 C)] 100 F (37.8 C) (09/05 0600) Pulse Rate:  [99-140] 101 (09/05 0400) Resp:  [15-26] 25 (09/05 0600) BP: (73-144)/(47-124) 130/80 (09/05 0500) SpO2:  [10 %-100 %] 93 % (09/05 0838) Arterial Line BP: (89-122)/(56-90) 116/79 (09/05 0600) FiO2 (%):  [40 %-100 %] 40 % (09/05 0838) Weight:  [56.8 kg] 56.8 kg (09/05 0500) Last BM Date :  (pta)  Intake/Output Summary (Last 24 hours) at 12/14/2022 0855 Last data filed at 12/14/2022 5409 Gross per 24 hour  Intake 8096.52 ml  Output 247 ml  Net 7849.52 ml    Intake/Output from previous day: 09/04 0701 - 09/05 0700 In: 8096.5 [I.V.:5301.2; Blood:2190.3; IV Piggyback:580] Out: 247 [Urine:147; Emesis/NG output:100] Intake/Output this shift: No intake/output data recorded.   Physical Exam:  General: chronically ill  appearing, intubated  Heart:  Regular rate and rhythm.  Pulmonary: vent sounds bilaterally Abdomen: distended, hypoactive bowel sounds, NG output bilious Extremities: No lower extremity edema  GU: foley present with minimal urine   Principal Problem:   Exacerbation of Crohn's disease of large intestine (HCC) Active Problems:   Malignant neoplasm of upper-outer quadrant of right breast in female, estrogen receptor positive (HCC)   Hypokalemia   Crohn's disease of both small and large intestine with intestinal obstruction (HCC)   Small bowel obstruction (HCC)   Colonic stricture (HCC)   SBO (small bowel obstruction) (HCC)   Cardiac arrest (HCC)     LOS: 1 day   Kiauna Zywicki J Canaan Holzer ,NP 12/14/2022, 8:55 AM

## 2022-12-14 NOTE — Plan of Care (Signed)
  Problem: Coping: Goal: Ability to adjust to condition or change in health will improve Outcome: Not Progressing   Problem: Fluid Volume: Goal: Ability to maintain a balanced intake and output will improve Outcome: Not Progressing   Problem: Health Behavior/Discharge Planning: Goal: Ability to identify and utilize available resources and services will improve Outcome: Not Progressing Goal: Ability to manage health-related needs will improve Outcome: Not Progressing   Problem: Metabolic: Goal: Ability to maintain appropriate glucose levels will improve Outcome: Not Progressing   Problem: Nutritional: Goal: Maintenance of adequate nutrition will improve Outcome: Not Progressing Goal: Progress toward achieving an optimal weight will improve Outcome: Not Progressing   Problem: Skin Integrity: Goal: Risk for impaired skin integrity will decrease Outcome: Not Progressing   Problem: Tissue Perfusion: Goal: Adequacy of tissue perfusion will improve Outcome: Not Progressing

## 2022-12-14 NOTE — Progress Notes (Signed)
NAME:  [name], MRN:  [id number], DOB:  07-01-1975, LOS: 1 ADMISSION DATE:  12/12/2022, CONSULTATION DATE:  12/13/22 REFERRING MD:  Rubin Payor, MD CHIEF COMPLAINT:  cardiac arrest    History of Present Illness:  47 year old female with past medical history of recurrent breast cancer not currently on treatment, Crohn's disease s/p subtotal colectomy in 2021 recurrent obstructions, colonic stricture, IDA, recent hospitalization 8/10-8/15/2024 for obstruction seen by GI and general surgery. Underwent EGD and flexible sigmoidoscopy and discharged on steroids who presented to the emergency department on 12/12/2022 with abdominal pain, nausea, vomiting diarrhea. In the ED tachycardic, hypertensive. ED provider notes abdominal distention. She was given 2L IVF, morphine, IV PPI. Labs with WBC 10.8, Hgb 10.7, K 3.2. CT AP was ordered and showed markedly dilated distal small bowel consistent with chronic obstruction. No changes since prior study. Patient was [facility] to [facility]. They consulted GI for input. Unfortunately, around 0820 this morning patient was found on the floor in the bathroom in ED pulseless. They estimate downtime around 5 minutes. ACLS was initiated. Concern for possible torsades de pointes and given IV mag, defibrillated. Coded for around 5 minutes. She was intubated. She had copious vomitus in the ETT. ROSC achieved and ICU was consulted. Notably, she had I-stat peri-code which showed hemoglobin of 5.5 down from 10 overnight.   Pertinent  Medical History  Crohn's disease with previous bowel obstruction s/p subtotal colectomy 2021, colonic stricture, IDA, breast cancer s/p mastectomy 2022  Significant Hospital Events: Including procedures, antibiotic start and stop dates in addition to other pertinent events   9/3: admitted to [facility] for abdominal distention, presumed Crohn's exacerbation 9/4: ~0820 cardiac arrest in ED. Admitted to ICU intubated, sedated. Hgb 5.5. Placed right femoral CVC.  Received 3 UPRBC, 3 FFP. 9/5: overnight increasing pressor requirements - added vaso. Bladder pressures 28, 23. Surgery this AM consenting to take for ex lap, ostomy today.   Interim History / Subjective:  Patient unable to participate in subjective history.   Objective   Blood pressure 130/80, pulse (!) 101, temperature 100 F (37.8 C), resp. rate (!) 25, height 5\' 4"  (1.626 m), weight 56.8 kg, last menstrual period 04/24/2021, SpO2 93%.    Vent Mode: PRVC FiO2 (%):  [40 %-100 %] 40 % Set Rate:  [16 bmp] 16 bmp Vt Set:  [440 mL] 440 mL PEEP:  [5 cmH20] 5 cmH20 Plateau Pressure:  [21 cmH20-32 cmH20] 32 cmH20   Intake/Output Summary (Last 24 hours) at 12/14/2022 0933 Last data filed at 12/14/2022 0932 Gross per 24 hour  Intake 8096.52 ml  Output 277 ml  Net 7819.52 ml   Filed Weights   12/14/22 0500  Weight: 56.8 kg    Examination: General: intubated, sedated. No acute distress HENT: sluggish pupils, gaze aligned. No scleral icterus.  Lungs: intubated, vented FiO2 40%, PEEP 5  Cardiovascular: RRR without murmur, rub, gallop  Abdomen: distended, rigid. +BS, NGT to LIS Extremities: no edema  Neuro: exam limited 2/2 sedation GU: foley  Resolved Hospital Problem list   Cardiac arrest   Assessment & Plan:  Shock; initially hypovolemic, now shifting to possibly septic shock with increasing wbc, low grade temp, aspiration yesterday.  - con't levo, vaso for MAP >65 - CBC stable at 11.7 after 3uPRBC, 3 FFP yesterday  - con't zosyn  - trend fever curve, lactic (which is rising again)  Acute hypoxic respiratory failure 2/2 cardiac arrest and aspiration  - continue vent support, which is minimal at this time. Will  keep sedated without SAT or SBT at this time pending OR  - VAP bundle ordered  - SAT/SBT when appropriate  - aspiration treatment as above   Crohn's disease s/p subtotal colectomy with ileal colonic anastomosis narrow on Entyvio and chronic steroids.  Small Bowel  Obstruction;  CTAP with SBO, cannot rule out closed loop obstruction and almost completely closed stricture. - to OR this AM with general surgery for exploratory laparotomy, ostomy  - NGT to low intermittent suction  - NPO  Acute on chronic anemia; history of IDA and rectal bleeding. Acute anemia over night. New imaging without obvious bleed. No melena or BRBPR noted. No hematemesis.  - Hgb stable at 11.7 after 3u PRBC, 3 FPP yesterday.  - BUN mildly elevated, on chronic steroids   ?PUD.  - OR today for ex lap which will also assess for any active bleeding.  - GI consulted and appreciate recs   Recurrent breast cancer not currently on treatment  - per Dr. Pamelia Hoit note 10/11/22, patient has declined further treatment at this time.  - to have follow-up in September to discuss anti-estrogen therapy.   Best Practice (right click and "Reselect all SmartList Selections" daily)   Diet/type: NPO DVT prophylaxis: not indicated - anemic - receiving PRBC GI prophylaxis: PPI Lines: Central line and yes and it is still needed Foley:  Yes, and it is still needed Code Status:  full code Last date of multidisciplinary goals of care discussion [9/4]  Labs   CBC: Recent Labs  Lab 12/12/22 2130 12/13/22 0818 12/13/22 1623 12/13/22 2155 12/14/22 0013 12/14/22 0329 12/14/22 0718  WBC 10.8*   < > 6.5 10.5 13.4* 18.1* 19.7*  NEUTROABS 8.1*  --   --   --   --   --   --   HGB 10.7*   < > 12.1 12.7 12.6 12.7 11.7*  HCT 34.4*   < > 37.3 37.7 36.7 37.5 34.7*  MCV 82.5   < > 84.8 82.9 83.4 81.9 82.2  PLT 505*   < > 216 225 213 195 179   < > = values in this interval not displayed.    Basic Metabolic Panel: Recent Labs  Lab 12/12/22 2130 12/13/22 0818 12/13/22 0827 12/13/22 2155 12/14/22 0445  NA 134* 143 137 134* 127*  K 3.2* 4.9 4.3 4.7 4.7  CL 102 113* 108 106 100  CO2 19*  --  14* 16* 14*  GLUCOSE 115* 157* 232* 116* 147*  BUN 13 15 21* 26* 31*  CREATININE 0.74 1.20* 1.64* 1.92* 1.95*   CALCIUM 7.7*  --  10.2 8.0* 7.7*  MG  --   --  4.7*  --  2.4   GFR: Estimated Creatinine Clearance: 31.5 mL/min (A) (by C-G formula based on SCr of 1.95 mg/dL (H)). Recent Labs  Lab 12/13/22 0832 12/13/22 1128 12/13/22 1623 12/13/22 1734 12/13/22 2155 12/14/22 0013 12/14/22 0329 12/14/22 0445 12/14/22 0718  WBC  --   --    < >  --  10.5 13.4* 18.1*  --  19.7*  LATICACIDVEN 12.0* 6.2*  --  3.3*  --   --   --  3.8*  --    < > = values in this interval not displayed.    Liver Function Tests: Recent Labs  Lab 12/12/22 2130 12/13/22 0827 12/14/22 0445  AST 16 224* 123*  ALT 17 77* 70*  ALKPHOS 138* 77 92  BILITOT 0.8 0.3 1.8*  PROT 6.5 3.3* 5.6*  ALBUMIN  2.3* <1.5* 2.2*   Recent Labs  Lab 12/12/22 2130  LIPASE 19   No results for input(s): "AMMONIA" in the last 168 hours.  ABG    Component Value Date/Time   PHART 7.3 (L) 12/13/2022 1815   PCO2ART 37 12/13/2022 1815   PO2ART 79 (L) 12/13/2022 1815   HCO3 18.2 (L) 12/13/2022 1815   TCO2 25 12/13/2022 0818   ACIDBASEDEF 7.5 (H) 12/13/2022 1815   O2SAT 94.4 12/13/2022 1815     Coagulation Profile: Recent Labs  Lab 12/13/22 1038 12/14/22 0445  INR 1.4* 1.3*    Cardiac Enzymes: No results for input(s): "CKTOTAL", "CKMB", "CKMBINDEX", "TROPONINI" in the last 168 hours.  HbA1C: Hgb A1c MFr Bld  Date/Time Value Ref Range Status  12/13/2022 06:45 AM 5.7 (H) 4.8 - 5.6 % Final    Comment:    (NOTE) Pre diabetes:          5.7%-6.4%  Diabetes:              >6.4%  Glycemic control for   <7.0% adults with diabetes     CBG: Recent Labs  Lab 12/13/22 1556 12/13/22 1956 12/14/22 0009 12/14/22 0447 12/14/22 0827  GLUCAP 124* 117* 91 134* 85    Review of Systems:   Patient unable to participate   Past Medical History:  She,  has a past medical history of Breast cancer (HCC), Colon stricture (HCC) (05/07/2019), Crohn's colitis, with intestinal obstruction (HCC) (09/17/2013), Crohn's disease (HCC),  Family history of breast cancer (06/11/2020), 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 (10/17/2010), Rectal bleeding, Uterine fibroid, and Vitamin D deficiency (09/24/2013).   Surgical History:   Past Surgical History:  Procedure Laterality Date   AXILLARY LYMPH NODE DISSECTION Right 11/09/2020   Procedure: RIGHT AXILLARY LYMPH NODE DISSECTION;  Surgeon: Emelia Loron, MD;  Location: [facility];  Service: General;  Laterality: Right;   BIOPSY  07/01/2020   Procedure: BIOPSY;  Surgeon: Benancio Deeds, MD;  Location: [facility];  Service: Gastroenterology;;   BIOPSY  01/27/2021   Procedure: BIOPSY;  Surgeon: Benancio Deeds, MD;  Location: [facility];  Service: Gastroenterology;;   BIOPSY  09/07/2021   Procedure: BIOPSY;  Surgeon: Lynann Bologna, MD;  Location: [facility];  Service: Gastroenterology;;   BIOPSY  11/21/2022   Procedure: BIOPSY;  Surgeon: Lemar Lofty., MD;  Location: [facility];  Service: Gastroenterology;;   BREAST BIOPSY Right 03/24/2022   [place] RT BREAST BX W LOC DEV 1ST LESION IMG BX SPEC US GUIDE 03/24/2022 [facility]   BREAST BIOPSY  04/28/2022   [place] RT RADIOACTIVE SEED LOC 04/28/2022 [facility]   BREAST IMPLANT REMOVAL Right 08/07/2022   Procedure: REMOVAL  OF RIGHT BREAST IMPLANTS;  Surgeon: Allena Napoleon, MD;  Location: [facility];  Service: Plastics;  Laterality: Right;   BREAST RECONSTRUCTION WITH PLACEMENT OF TISSUE EXPANDER AND FLEX HD (ACELLULAR HYDRATED DERMIS) Bilateral 11/09/2020   Procedure: BILATERAL BREAST RECONSTRUCTION WITH PLACEMENT OF TISSUE EXPANDER AND FLEX HD (ACELLULAR HYDRATED DERMIS);  Surgeon: Allena Napoleon, MD;  Location: [facility];  Service: Plastics;  Laterality: Bilateral;   CAPSULOTOMY Bilateral 07/25/2021   Procedure: CAPSULOTOMY;  Surgeon: Allena Napoleon, MD;  Location: [facility];  Service: Plastics;   Laterality: Bilateral;   COLECTOMY  06/27/2019   COLONOSCOPY  2015   ESOPHAGOGASTRODUODENOSCOPY (EGD) WITH PROPOFOL N/A 11/21/2022   Procedure: ESOPHAGOGASTRODUODENOSCOPY (EGD) WITH PROPOFOL;  Surgeon: Lemar Lofty.,  MD;  Location: [facility];  Service: Gastroenterology;  Laterality: N/A;   FLEXIBLE SIGMOIDOSCOPY N/A 07/01/2020   Procedure: FLEXIBLE SIGMOIDOSCOPY;  Surgeon: Benancio Deeds, MD;  Location: [facility];  Service: Gastroenterology;  Laterality: N/A;   FLEXIBLE SIGMOIDOSCOPY N/A 01/27/2021   Procedure: FLEXIBLE SIGMOIDOSCOPY;  Surgeon: Benancio Deeds, MD;  Location: [facility];  Service: Gastroenterology;  Laterality: N/A;   FLEXIBLE SIGMOIDOSCOPY N/A 09/07/2021   Procedure: FLEXIBLE SIGMOIDOSCOPY;  Surgeon: Lynann Bologna, MD;  Location: [facility];  Service: Gastroenterology;  Laterality: N/A;   FLEXIBLE SIGMOIDOSCOPY N/A 11/21/2022   Procedure: FLEXIBLE SIGMOIDOSCOPY;  Surgeon: Meridee Score Netty Starring., MD;  Location: [facility];  Service: Gastroenterology;  Laterality: N/A;   NIPPLE SPARING MASTECTOMY Bilateral 11/09/2020   Procedure: BILATERAL NIPPLE SPARING MASTECTOMY;  Surgeon: Emelia Loron, MD;  Location: [facility];  Service: General;  Laterality: Bilateral;   PORT-A-CATH REMOVAL N/A 07/25/2021   Procedure: REMOVAL PORT-A-CATH;  Surgeon: Allena Napoleon, MD;  Location: [facility];  Service: Plastics;  Laterality: N/A;   PORTACATH PLACEMENT N/A 06/23/2020   Procedure: INSERTION PORT-A-CATH;  Surgeon: Emelia Loron, MD;  Location: [facility];  Service: General;  Laterality: N/A;  START TIME OF 11:00 AM FOR 60 MINUTES [place] [facility]   PORTACATH PLACEMENT Left 05/02/2022   Procedure: INSERTION PORT-A-CATH;  Surgeon: Emelia Loron, MD;  Location: [facility];  Service: General;  Laterality: Left;   RADIOACTIVE SEED GUIDED EXCISIONAL BREAST BIOPSY Right 05/02/2022   Procedure: RADIOACTIVE  SEED GUIDED RIGHT BREAST MASS EXCISION;  Surgeon: Emelia Loron, MD;  Location: [facility];  Service: General;  Laterality: Right;   REMOVAL OF BILATERAL TISSUE EXPANDERS WITH PLACEMENT OF BILATERAL BREAST IMPLANTS Bilateral 07/25/2021   Procedure: REMOVAL OF BILATERAL TISSUE EXPANDERS WITH PLACEMENT OF BILATERAL BREAST IMPLANTS;  Surgeon: Allena Napoleon, MD;  Location: [facility];  Service: Plastics;  Laterality: Bilateral;  1.5     Social History:   reports that she has quit smoking. Her smoking use included cigarettes. She has never used smokeless tobacco. She reports current alcohol use of about 1.0 - 2.0 standard drink of alcohol per week. She reports that she does not use drugs.   Family History:  Her family history includes Breast cancer in her cousin and maternal aunt; Diabetes in her father and paternal aunt. There is no history of Esophageal cancer, Rectal cancer, Stomach cancer, Colon polyps, or Colon cancer.   Allergies Allergies  Allergen Reactions   Nsaids Other (See Comments)    Crohns disease/ IBD   Other     BLOOD PRODUCT REFUSAL     Home Medications  Prior to Admission medications   Medication Sig Start Date End Date Taking? Authorizing Provider  acetaminophen (TYLENOL) 500 MG tablet Take 500-1,000 mg by mouth every 6 (six) hours as needed for mild pain.   Yes [provider]  cholecalciferol (VITAMIN D3) 25 MCG (1000 UNIT) tablet Take 1,000 Units by mouth daily.   Yes [provider]  hyoscyamine (LEVSIN SL) 0.125 MG SL tablet Place 1 tablet (0.125 mg total) under the tongue every 6 (six) hours as needed for cramping. 11/23/22  Yes Standley Brooking, MD  Multiple Vitamins-Minerals (MULTIVITAMIN WITH MINERALS) tablet Take 1 tablet by mouth daily.   Yes [provider]  nystatin (MYCOSTATIN) 100000 UNIT/ML suspension Take 5 mLs (500,000 Units total) by mouth 4 (four) times daily. 11/23/22  Yes Standley Brooking,  MD  potassium chloride SA (  KLOR-CON M) 20 MEQ tablet Take 1 tablet (20 mEq total) by mouth daily. 12/27/21  Yes Iva Boop, MD  predniSONE (DELTASONE) 20 MG tablet Take 2 tablets (40 mg total) by mouth daily with breakfast for 7 days, THEN 1.5 tablets (30 mg total) daily with breakfast for 7 days, THEN 1 tablet (20 mg total) daily with breakfast for 21 days. 11/24/22 12/29/22 Yes Standley Brooking, MD  simethicone (MYLICON) 80 MG chewable tablet Chew 1 tablet (80 mg total) by mouth 4 (four) times daily as needed for flatulence. 11/23/22  Yes Standley Brooking, MD     Critical care time: 56

## 2022-12-14 NOTE — Progress Notes (Signed)
Progress Note     Subjective: Intubated and sedated. On 2 pressors. NGT output bilious and low volume, no output per rectum. Abdomen remains distended. UOP low. Daughter is at bedside and is the Management consultant.   Objective: Vital signs in last 24 hours: Temp:  [93.7 F (34.3 C)-100.9 F (38.3 C)] 100 F (37.8 C) (09/05 0600) Pulse Rate:  [99-140] 101 (09/05 0400) Resp:  [15-26] 25 (09/05 0600) BP: (73-144)/(47-124) 130/80 (09/05 0500) SpO2:  [10 %-100 %] 93 % (09/05 0838) Arterial Line BP: (89-122)/(56-90) 116/79 (09/05 0600) FiO2 (%):  [40 %-100 %] 40 % (09/05 0838) Weight:  [56.8 kg] 56.8 kg (09/05 0500) Last BM Date :  (pta)  Intake/Output from previous day: 09/04 0701 - 09/05 0700 In: 8096.5 [I.V.:5301.2; Blood:2190.3; IV Piggyback:580] Out: 247 [Urine:147; Emesis/NG output:100] Intake/Output this shift: No intake/output data recorded.  PE: General: WD female who is intubated and critically ill Heart: regular, rate, and rhythm.  Normal s1,s2. No obvious murmurs, gallops, or rubs noted.  Palpable pedal pulses bilaterally Lungs: vent sounds bilaterally Abd: distended, BS hypoactive, NGT output bilious GU: foley present with minimal urine that appears somewhat concentrated MS: all 4 extremities are symmetrical with no cyanosis, clubbing, or edema.   Lab Results:  Recent Labs    12/14/22 0329 12/14/22 0718  WBC 18.1* 19.7*  HGB 12.7 11.7*  HCT 37.5 34.7*  PLT 195 179   BMET Recent Labs    12/13/22 2155 12/14/22 0445  NA 134* 127*  K 4.7 4.7  CL 106 100  CO2 16* 14*  GLUCOSE 116* 147*  BUN 26* 31*  CREATININE 1.92* 1.95*  CALCIUM 8.0* 7.7*   PT/INR Recent Labs    12/13/22 1038 12/14/22 0445  LABPROT 17.4* 16.6*  INR 1.4* 1.3*   CMP     Component Value Date/Time   NA 127 (L) 12/14/2022 0445   NA 135 01/21/2020 1056   K 4.7 12/14/2022 0445   CL 100 12/14/2022 0445   CO2 14 (L) 12/14/2022 0445   GLUCOSE 147 (H) 12/14/2022 0445   BUN 31 (H)  12/14/2022 0445   BUN 12 01/21/2020 1056   CREATININE 1.95 (H) 12/14/2022 0445   CREATININE 0.91 09/21/2022 1141   CALCIUM 7.7 (L) 12/14/2022 0445   PROT 5.6 (L) 12/14/2022 0445   ALBUMIN 2.2 (L) 12/14/2022 0445   AST 123 (H) 12/14/2022 0445   AST 18 09/21/2022 1141   ALT 70 (H) 12/14/2022 0445   ALT 12 09/21/2022 1141   ALKPHOS 92 12/14/2022 0445   BILITOT 1.8 (H) 12/14/2022 0445   BILITOT 0.4 09/21/2022 1141   GFRNONAA 32 (L) 12/14/2022 0445   GFRNONAA >60 09/21/2022 1141   GFRAA 108 01/21/2020 1056   Lipase     Component Value Date/Time   LIPASE 19 12/12/2022 2130       Studies/Results: DG Chest 1 View  Result Date: 12/14/2022 CLINICAL DATA:  Positioning of the ETT EXAM: CHEST  1 VIEW COMPARISON:  12/13/2022 FINDINGS: Opacity in the left base consistent with layering effusion or consolidation. Expiratory view was obtained with vascular crowding. No pneumothorax identified. Incidental note is made of distended loops of bowel. Endotracheal tube tip just below thoracic inlet. Left-sided Port-A-Cath tip distal SVC. NG tube tip superimposed with stomach. IMPRESSION: Persistent left basilar consolidation or layering effusion. Expiratory view with vascular crowding. Electronically Signed   By: Layla Maw M.D.   On: 12/14/2022 08:26   DG Abd 1 View  Result Date: 12/13/2022 CLINICAL  DATA:  Emesis. EXAM: ABDOMEN - 1 VIEW COMPARISON:  Radiograph and CT earlier today FINDINGS: Tip and side port of the enteric tube below the diaphragm in the stomach. There is diffuse gaseous bowel distension without significant interval change. Enteric sutures noted in the left abdomen, post subtotal colectomy on prior cross-sectional exam. No obvious free air. Right femoral catheter tip projects in the expected location of the right common femoral vessels. IMPRESSION: 1. Tip and side port of the enteric tube below the diaphragm in the stomach. 2. Diffuse gaseous bowel distension without significant  interval change. Electronically Signed   By: Narda Rutherford M.D.   On: 12/13/2022 22:21   ECHOCARDIOGRAM LIMITED  Result Date: 12/13/2022    ECHOCARDIOGRAM LIMITED REPORT   Patient Name:   NAELY NIEDER Date of Exam: 12/13/2022 Medical Rec #:  [id number]        Height:       64.0 in Accession #:    [id number]       Weight:       102.0 lb Date of Birth:  July 23, 1975        BSA:          1.469 m Patient Age:    47 years         BP:           98/82 mmHg Patient Gender: F                HR:           103 bpm. Exam Location:  Inpatient Procedure: Limited Echo, Limited Color Doppler and Cardiac Doppler Indications:    Cardiac arrest I46.9  History:        Patient has prior history of Echocardiogram examinations, most                 recent 11/19/2022. Bowel obstruction; Risk Factors:Former Smoker.                 H/o breast cancer.  Sonographer:    Dondra Prader RVT RCS Referring Phys: [id number] Lorin Glass  Sonographer Comments: Technically challenging study due to limited acoustic windows, Technically difficult study due to poor echo windows, no subcostal window and echo performed with patient supine and on artificial respirator. Image acquisition challenging due to patient body habitus, Image acquisition challenging due to breast implants and Image acquisition challenging due to respiratory motion. IMPRESSIONS  1. Windows are challenging, very limited study. Left ventricular ejection fraction, by estimation, is 50 to 55%. The left ventricle has low normal function.  2. Right ventricular systolic function is mildly reduced. The right ventricular size is moderately enlarged.  3. The mitral valve is normal in structure. Trivial mitral valve regurgitation.  4. Aortic valve regurgitation is not visualized.  5. The inferior vena cava IVC not well visualized. Conclusion(s)/Recommendation(s): RV appears more dilated than prior study , recommend assessment for PE. FINDINGS  Left Ventricle: Windows are challenging, very  limited study. Left ventricular ejection fraction, by estimation, is 50 to 55%. The left ventricle has low normal function. There is no left ventricular hypertrophy. Right Ventricle: The right ventricular size is moderately enlarged. Right ventricular systolic function is mildly reduced. Mitral Valve: The mitral valve is normal in structure. Trivial mitral valve regurgitation. Tricuspid Valve: Tricuspid valve regurgitation is mild. Aortic Valve: Aortic valve regurgitation is not visualized. Venous: The inferior vena cava IVC not well visualized. LEFT VENTRICLE PLAX 2D LVIDd:  3.10 cm LVIDs:         2.30 cm LV PW:         0.80 cm LV IVS:        0.80 cm  LEFT ATRIUM         Index LA diam:    2.50 cm 1.70 cm/m  TRICUSPID VALVE TR Peak grad:   15.1 mmHg TR Vmax:        194.00 cm/s Carolan Clines Electronically signed by Carolan Clines Signature Date/Time: 12/13/2022/12:54:16 PM    Final    CT HEAD WO CONTRAST ( )  Result Date: 12/13/2022 CLINICAL DATA:  Provided history: Head trauma, GCS=15, vomiting. EXAM: CT HEAD WITHOUT CONTRAST TECHNIQUE: Contiguous axial images were obtained from the base of the skull through the vertex without intravenous contrast. RADIATION DOSE REDUCTION: This exam was performed according to the departmental dose-optimization program which includes automated exposure control, adjustment of the mA and/or kV according to patient size and/or use of iterative reconstruction technique. COMPARISON:  None. FINDINGS: Brain: Cerebral volume is normal. There is no acute intracranial hemorrhage. No demarcated cortical infarct. No extra-axial fluid collection. No evidence of an intracranial mass. No midline shift. Vascular: No hyperdense vessel Skull: No calvarial fracture or aggressive osseous lesion. Sinuses/Orbits: No mass or acute finding within the imaged orbits. No significant paranasal sinus disease at the imaged levels. IMPRESSION: No evidence of an acute intracranial abnormality.  Electronically Signed   By: Jackey Loge D.O.   On: 12/13/2022 10:03   CT ABDOMEN PELVIS WO CONTRAST  Result Date: 12/13/2022 CLINICAL DATA:  Acute generalized abdominal pain, vomiting. EXAM: CT ABDOMEN AND PELVIS WITHOUT CONTRAST TECHNIQUE: Multidetector CT imaging of the abdomen and pelvis was performed following the standard protocol without IV contrast. RADIATION DOSE REDUCTION: This exam was performed according to the departmental dose-optimization program which includes automated exposure control, adjustment of the mA and/or kV according to patient size and/or use of iterative reconstruction technique. COMPARISON:  December 12, 2022. FINDINGS: Lower chest: New left posterior basilar airspace opacity consistent with pneumonia. Hepatobiliary: No focal liver abnormality is seen. No gallstones, gallbladder wall thickening, or biliary dilatation. Pancreas: Unremarkable. No pancreatic ductal dilatation or surrounding inflammatory changes. Spleen: Normal in size without focal abnormality. Adrenals/Urinary Tract: Adrenal glands are unremarkable. Kidneys are normal, without renal calculi, focal lesion, or hydronephrosis. Bladder is unremarkable. Stomach/Bowel: Status post subtotal colectomy with surgical anastomosis seen in left lower quadrant. Distal tip of nasogastric tube is seen in proximal stomach. There is again noted severely dilated small bowel with air-fluid levels concerning for distal obstruction. There is a dilated segment of small bowel immediately proximal to the surgical anastomosis which does not appear to be dilated immediately proximally, and the possibility of closed loop obstruction cannot be excluded. This is best seen on image number 69 of series 8. Vascular/Lymphatic: No significant vascular findings are present. No enlarged abdominal or pelvic lymph nodes. Reproductive: Calcified uterine fibroid is again noted. No adnexal abnormality. Other: No abdominal wall hernia or abnormality. No  abdominopelvic ascites. Musculoskeletal: No acute or significant osseous findings. IMPRESSION: Status post subtotal colectomy. Worsening severe small bowel dilatation is noted with air-fluid levels concerning for distal small bowel obstruction. There is a dilated segment of small bowel immediately proximal to the surgical anastomosis which does not appear to be dilated immediately proximally, and the possibility of closed loop obstruction cannot be excluded. New left posterior basilar airspace opacity is noted concerning for pneumonia. Calcified uterine fibroid. Electronically Signed   By:  Lupita Raider M.D.   On: 12/13/2022 10:00   DG Chest Portable 1 View  Result Date: 12/13/2022 CLINICAL DATA:  Post intubation. EXAM: PORTABLE CHEST 1 VIEW COMPARISON:  Abdominal radiograph-earlier same day; CT abdomen pelvis-12/12/2022; chest CT-11/18/2022 FINDINGS: Unchanged cardiac silhouette and mediastinal contours. Endotracheal tube tip overlies the tracheal air column with tip 1 cm above the carina. Enteric tube tip and side port project over the left upper abdominal quadrant. Left jugular approach port a catheter tip overlies the superior cavoatrial junction. Veiling opacity overlying the left lower lung compatible with known breast prosthesis. Minimal perihilar heterogeneous opacities favored to represent atelectasis. No supine evidence of pleural effusion or pneumothorax. No definite evidence of edema. Marked gaseous distention of multiple loops of colon. No acute osseous abnormalities. IMPRESSION: 1. Appropriately positioned support apparatus as above. No supine evidence of pneumothorax. 2. Marked gaseous distention of multiple loops of colon, better demonstrated on preceding abdominal CT. Electronically Signed   By: Simonne Come M.D.   On: 12/13/2022 08:46   DG Abd Portable 1 View  Result Date: 12/13/2022 CLINICAL DATA:  Post NG tube placement. EXAM: PORTABLE ABDOMEN - 1 VIEW COMPARISON:  Chest radiograph-earlier  same day; CT abdomen pelvis-12/12/2022 FINDINGS: Enteric tube tip and side port project over the left upper abdominal quadrant Redemonstrated marked gaseous distention of multiple loops of colon. Epicardial pad overlies the cardiac apex. Uterine fibroid overlies the left hemipelvis. No acute osseous abnormalities. IMPRESSION: Enteric tube tip and side port project over the left upper abdominal quadrant. Electronically Signed   By: Simonne Come M.D.   On: 12/13/2022 08:44   CT ABDOMEN PELVIS WO CONTRAST  Result Date: 12/12/2022 CLINICAL DATA:  Acute nonlocalized abdominal pain. History of Crohn disease. Nausea, vomiting, and diarrhea. EXAM: CT ABDOMEN AND PELVIS WITHOUT CONTRAST TECHNIQUE: Multidetector CT imaging of the abdomen and pelvis was performed following the standard protocol without IV contrast. RADIATION DOSE REDUCTION: This exam was performed according to the departmental dose-optimization program which includes automated exposure control, adjustment of the mA and/or kV according to patient size and/or use of iterative reconstruction technique. COMPARISON:  11/18/2022 FINDINGS: Lower chest: Lung bases are clear. Left breast implant. Presumed right mastectomy. Hepatobiliary: No focal liver abnormality is seen. No gallstones, gallbladder wall thickening, or biliary dilatation. Pancreas: Unremarkable. No pancreatic ductal dilatation or surrounding inflammatory changes. Spleen: Normal in size without focal abnormality. Adrenals/Urinary Tract: Adrenal glands are unremarkable. Kidneys are normal, without renal calculi, focal lesion, or hydronephrosis. Bladder is unremarkable. Stomach/Bowel: Postoperative subtotal colectomy with surgical anastomosis in the left lower quadrant. There are markedly dilated distal loops of small bowel with air-fluid levels and diffusely thickened wall. This could indicate chronic obstruction and possibly indicates anastomotic or inflammatory stricture. Appearance is similar to  prior study. Remainder of the small bowel is decompressed. Vascular/Lymphatic: No significant vascular findings are present. No enlarged abdominal or pelvic lymph nodes. Reproductive: Enlarged uterus with myometrial calcifications consistent with fibroids. No abnormal adnexal masses. Other: Small amount of free fluid in the pelvis. No free air. Abdominal wall musculature appears intact. Musculoskeletal: No acute or significant osseous findings. IMPRESSION: 1. No significant change in appearance since prior examination. 2. Again, there is postoperative change with subtotal colectomy and markedly dilated distal small bowel. Dilated small bowel containing air-fluid levels consistent with liquid stool and have diffuse wall thickening. Changes may represent chronic obstruction possibly due to anastomotic or inflammatory stricture. Bowel wall thickening suggests inflammation consistent with history of Crohn's. No significant change since  prior study. 3. Calcified uterine fibroids. 4. Small amount of free fluid in the pelvis is likely reactive. Electronically Signed   By: Burman Nieves M.D.   On: 12/12/2022 22:54    Anti-infectives: Anti-infectives (From admission, onward)    Start     Dose/Rate Route Frequency Ordered Stop   12/13/22 1000  piperacillin-tazobactam (ZOSYN) IVPB 3.375 g        3.375 g 12.5 mL/hr over 240 Minutes Intravenous Every 8 hours 12/13/22 5284          Assessment/Plan  Crohn's disease s/p subtotal colectomy with ileosigmoid anastomosis 2021 Anastomotic stricture with bowel obstruction  - s/p flex sig 8/13 and was started on steroids and entyvio - back with obstruction and no option for decompression from below per GI - recommend proceeding with OR today for ileostomy to decompress and relieve obstruction  - WOC consulted for marking  - this was discussed at bedside with patient's daughter this morning and she is agreeable to proceed. Questions answered  S/P Arrest likely 2/2  aspiration - intubated and now on 2 pressors. Hgb 5.8 yesterday AM, 11.7 this AM s/p 3 PRBC and 3 FFP  Patient is in multisystem organ failure and concerned that she will continue to worsen if obstruction not relieved.   FEN: NPO, NGT to LIWS, LR @125cc /h VTE: SCDs ID: [id number] 9/4>>  LOS: 1 day   I reviewed Consultant GI notes, last 24 h vitals and pain scores, last 48 h intake and output, last 24 h labs and trends, last 24 h imaging results, and CCM notes .   Juliet Rude, [facility] 12/14/2022, 9:10 AM Please see Amion for pager number during day hours 7:00am-4:30pm

## 2022-12-14 NOTE — Consult Note (Addendum)
WOC Nurse requested for preoperative stoma site marking  Discussed surgical procedure and stoma creation with daughter at the bedside.  Pt is critically ill and on the ventilator.  Explained role of the WOC nurse team.  Provided with educational booklet and provided samples of pouching options. Answered daughter's questions.   Examined patient lying in bed; attempted to place the marking within patient's visual field, away from any creases or abdominal contour issues and within the rectus muscle.  It is difficult to determine since the abd is very tight and distended at this time. Attempted to mark below the patient's belt line.   Marked for ileostomy in the RLQ  __5__cm to the right of the umbilicus and  _3___ cm below the umbilicus.  Patient's abdomen cleansed with CHG wipes at site markings, allowed to air dry prior to marking. Plans for surgery this morning. WOC Nurse team will follow up with patient after surgery for continued ostomy care and teaching when she is stable and out of ICU. Thank-you,  Cammie Mcgee MSN, RN, CWOCN, [place], CNS [phone]

## 2022-12-14 NOTE — Progress Notes (Signed)
Bilateral lower extremity venous duplex has been completed. Preliminary results can be found in CV Proc through chart review.   12/14/22 9:38 AM Olen Cordial RVT

## 2022-12-14 NOTE — Progress Notes (Signed)
PMT no charge note.   Alerted by surgical colleagues at that the patient's family would like to have a further goals of care discussions.  Arrived at bedside.  Met with patient's daughter [name] outside the room and subsequently another family meeting held inside the room as well with daughter's father, and other family members in the room.  Also discussed with nursing colleagues.  Patient's family has a lots of questions and concerns about the patient's overall health and what her quality of life will be.  They asked questions regarding whether she will be able to come home, whether she will be able to ambulate and what her overall prognosis is like.  All of their questions and concerns addressed to the best of my ability.  Stressed that palliative care will continue to follow for ongoing discussions.  At this time, discussed about the concept of DNR-continuing aggressive ventilatory support/ICU care but if the patient were to have another arrest, we discussed about the option of not doing CPR and resorting to comfort focused care at that time.  Family will continue to discuss this amongst themselves and let nursing colleagues know about their decision.  For now, continue full code full scope care and PMT will follow-up in a.m. on 12-15-2022. No charge Courtney Hawking MD [facility] palliative.

## 2022-12-14 NOTE — Progress Notes (Signed)
CCC Pre-op Review  Pre-op checklist:   NPO:   Labs:   Consent:   H&P:   Vitals:   O2 requirements:   MAR/PTA review:   IV:   Floor nurse name:    Additional info:  Patinent intubated will be straight back

## 2022-12-14 NOTE — Progress Notes (Signed)
Called by Dr. Katrinka Blazing with critical care medicine over concerns of this sickly patient.  Patient remains distended and is on one pressor from her syncopal event and coding in the ER on initial presentation..  Bladder pressure in the 20s.  Lactate is improved.  Patient's hemoglobin of 6 responded with 3 units of packed red blood cells to 12.  Does not seem to have increased pressor needs at this time.  Patient creatinine not massively elevated but very suspicious for significant AKI since she remains oliguric despite being 5 L positive and resuscitation  Seen by Gastroenterology this evening.  They are guarded in offering endoscopic interventions at this time.  Suspicion remains that ileo-rectosigmoid side-to-side anastomosis from 2 years ago has completely strictured down most likely due to to persistent Crohn's with the ability to control hampered by her need to be off immunosuppression to focus on breast cancer diagnosis and chemotherapy last year.  Her lactate is improved.  Her bladder pressures are not critical..  There has been no evidence of pneumatosis or free air or perforation at this time.  She had no evidence of frank peritonitis on examination earlier today.    Unfortunately, the ability to operate at night has been severely restricted due to staffing issues in the OR department.  Recommend continued volume resuscitation.  Agree with IV steroids in the hopes of getting Crohn's under control and letting this area open back up.  I am concerned since there was evidence of stricturing last year on endoscopy  I wrote to advance nasogastric tube in hopes he could get into the intestine better decompressed  I canceled all enteral meds since that cannot be relied upon..  Will update inpatient surgeon, Dr. Hillery Hunter to evaluate.  Ttouch base with Dr. Maisie Fus, colorectal surgeon that did the patient's abdominal colectomy and ileal rectosigmoid anastomosis for poorly controlled Crohn's colitis a few  years ago to see what other options are available.  Can discuss again with gastroenterology, but I worry patient will require operative invention tomorrow if does not improve.

## 2022-12-15 ENCOUNTER — Encounter (HOSPITAL_COMMUNITY): Payer: Self-pay | Admitting: General Surgery

## 2022-12-15 ENCOUNTER — Inpatient Hospital Stay (HOSPITAL_COMMUNITY): Payer: Managed Care, Other (non HMO)

## 2022-12-15 DIAGNOSIS — R571 Hypovolemic shock: Secondary | ICD-10-CM | POA: Diagnosis not present

## 2022-12-15 DIAGNOSIS — K5010 Crohn's disease of large intestine without complications: Secondary | ICD-10-CM | POA: Diagnosis not present

## 2022-12-15 DIAGNOSIS — Z9049 Acquired absence of other specified parts of digestive tract: Secondary | ICD-10-CM

## 2022-12-15 DIAGNOSIS — K50012 Crohn's disease of small intestine with intestinal obstruction: Secondary | ICD-10-CM

## 2022-12-15 DIAGNOSIS — E440 Moderate protein-calorie malnutrition: Secondary | ICD-10-CM

## 2022-12-15 LAB — BLOOD GAS, ARTERIAL
Acid-base deficit: 11 mmol/L — ABNORMAL HIGH (ref 0.0–2.0)
Acid-base deficit: 9.2 mmol/L — ABNORMAL HIGH (ref 0.0–2.0)
Acid-base deficit: 7.3 mmol/L — ABNORMAL HIGH (ref 0.0–2.0)
Bicarbonate: 13.6 mmol/L — ABNORMAL LOW (ref 20.0–28.0)
Bicarbonate: 15.3 mmol/L — ABNORMAL LOW (ref 20.0–28.0)
Bicarbonate: 16.9 mmol/L — ABNORMAL LOW (ref 20.0–28.0)
Drawn by: 560031
Drawn by: 560031
FIO2: 0.3 %
FIO2: 30 %
FIO2: 30 %
MECHVT: 440 mL
MECHVT: 440 mL
MECHVT: 440 mL
O2 Saturation: 96.3 %
O2 Saturation: 96 %
O2 Saturation: 96.5 %
PEEP: 5 cmH2O
PEEP: 5 cmH2O
PEEP: 5 cmH2O
Patient temperature: 37.4
Patient temperature: 37
Patient temperature: 37
RATE: 16 {breaths}/min
RATE: 16 {breaths}/min
RATE: 16 {breaths}/min
pCO2 arterial: 27 mmHg — ABNORMAL LOW (ref 32–48)
pCO2 arterial: 29 mmHg — ABNORMAL LOW (ref 32–48)
pCO2 arterial: 30 mmHg — ABNORMAL LOW (ref 32–48)
pH, Arterial: 7.3 — ABNORMAL LOW (ref 7.35–7.45)
pH, Arterial: 7.33 — ABNORMAL LOW (ref 7.35–7.45)
pH, Arterial: 7.36 (ref 7.35–7.45)
pO2, Arterial: 131 mmHg — ABNORMAL HIGH (ref 83–108)
pO2, Arterial: 137 mmHg — ABNORMAL HIGH (ref 83–108)
pO2, Arterial: 136 mmHg — ABNORMAL HIGH (ref 83–108)

## 2022-12-15 LAB — COOXEMETRY PANEL
Carboxyhemoglobin: <0.3 % — ABNORMAL LOW (ref 0.5–1.5)
Methemoglobin: <0.7 % (ref 0.0–1.5)
O2 Saturation: 96.2 %
Total hemoglobin: 10.4 g/dL — ABNORMAL LOW (ref 12.0–16.0)

## 2022-12-15 LAB — CBC
HCT: 32 % — ABNORMAL LOW (ref 36.0–46.0)
Hemoglobin: 10.7 g/dL — ABNORMAL LOW (ref 12.0–15.0)
MCH: 27.8 pg (ref 26.0–34.0)
MCHC: 33.4 g/dL (ref 30.0–36.0)
MCV: 83.1 fL (ref 80.0–100.0)
Platelets: 126 10*3/uL — ABNORMAL LOW (ref 150–400)
RBC: 3.85 MIL/uL — ABNORMAL LOW (ref 3.87–5.11)
RDW: 19.2 % — ABNORMAL HIGH (ref 11.5–15.5)
WBC: 25.1 10*3/uL — ABNORMAL HIGH (ref 4.0–10.5)
nRBC: 0 % (ref 0.0–0.2)

## 2022-12-15 LAB — COMPREHENSIVE METABOLIC PANEL
ALT: 60 U/L — ABNORMAL HIGH (ref 0–44)
ALT: 61 U/L — ABNORMAL HIGH (ref 0–44)
ALT: 63 U/L — ABNORMAL HIGH (ref 0–44)
AST: 123 U/L — ABNORMAL HIGH (ref 15–41)
AST: 121 U/L — ABNORMAL HIGH (ref 15–41)
AST: 125 U/L — ABNORMAL HIGH (ref 15–41)
Albumin: 1.7 g/dL — ABNORMAL LOW (ref 3.5–5.0)
Albumin: 1.7 g/dL — ABNORMAL LOW (ref 3.5–5.0)
Albumin: 1.7 g/dL — ABNORMAL LOW (ref 3.5–5.0)
Alkaline Phosphatase: 67 U/L (ref 38–126)
Alkaline Phosphatase: 74 U/L (ref 38–126)
Alkaline Phosphatase: 80 U/L (ref 38–126)
Anion gap: 13 (ref 5–15)
Anion gap: 13 (ref 5–15)
Anion gap: 11 (ref 5–15)
BUN: 32 mg/dL — ABNORMAL HIGH (ref 6–20)
BUN: 30 mg/dL — ABNORMAL HIGH (ref 6–20)
BUN: 31 mg/dL — ABNORMAL HIGH (ref 6–20)
CO2: 14 mmol/L — ABNORMAL LOW (ref 22–32)
CO2: 15 mmol/L — ABNORMAL LOW (ref 22–32)
CO2: 17 mmol/L — ABNORMAL LOW (ref 22–32)
Calcium: 7.3 mg/dL — ABNORMAL LOW (ref 8.9–10.3)
Calcium: 7.5 mg/dL — ABNORMAL LOW (ref 8.9–10.3)
Calcium: 7.4 mg/dL — ABNORMAL LOW (ref 8.9–10.3)
Chloride: 100 mmol/L (ref 98–111)
Chloride: 102 mmol/L (ref 98–111)
Chloride: 101 mmol/L (ref 98–111)
Creatinine, Ser: 2.11 mg/dL — ABNORMAL HIGH (ref 0.44–1.00)
Creatinine, Ser: 2.1 mg/dL — ABNORMAL HIGH (ref 0.44–1.00)
Creatinine, Ser: 2.06 mg/dL — ABNORMAL HIGH (ref 0.44–1.00)
GFR, Estimated: 29 mL/min — ABNORMAL LOW (ref >60)
GFR, Estimated: 29 mL/min — ABNORMAL LOW (ref >60)
GFR, Estimated: 30 mL/min — ABNORMAL LOW (ref >60)
Glucose, Bld: 176 mg/dL — ABNORMAL HIGH (ref 70–99)
Glucose, Bld: 159 mg/dL — ABNORMAL HIGH (ref 70–99)
Glucose, Bld: 162 mg/dL — ABNORMAL HIGH (ref 70–99)
Potassium: 4.3 mmol/L (ref 3.5–5.1)
Potassium: 3.9 mmol/L (ref 3.5–5.1)
Potassium: 3.7 mmol/L (ref 3.5–5.1)
Sodium: 127 mmol/L — ABNORMAL LOW (ref 135–145)
Sodium: 130 mmol/L — ABNORMAL LOW (ref 135–145)
Sodium: 129 mmol/L — ABNORMAL LOW (ref 135–145)
Total Bilirubin: 1.9 mg/dL — ABNORMAL HIGH (ref 0.3–1.2)
Total Bilirubin: 1.8 mg/dL — ABNORMAL HIGH (ref 0.3–1.2)
Total Bilirubin: 1.9 mg/dL — ABNORMAL HIGH (ref 0.3–1.2)
Total Protein: 4 g/dL — ABNORMAL LOW (ref 6.5–8.1)
Total Protein: 4 g/dL — ABNORMAL LOW (ref 6.5–8.1)
Total Protein: 4.1 g/dL — ABNORMAL LOW (ref 6.5–8.1)

## 2022-12-15 LAB — ECHOCARDIOGRAM COMPLETE
Height: 64 in
S' Lateral: 3.2 cm
Single Plane A4C EF: 63.3 %
Weight: 2109.36 [oz_av]

## 2022-12-15 LAB — GLUCOSE, CAPILLARY
Glucose-Capillary: 113 mg/dL — ABNORMAL HIGH (ref 70–99)
Glucose-Capillary: 122 mg/dL — ABNORMAL HIGH (ref 70–99)
Glucose-Capillary: 130 mg/dL — ABNORMAL HIGH (ref 70–99)
Glucose-Capillary: 131 mg/dL — ABNORMAL HIGH (ref 70–99)
Glucose-Capillary: 131 mg/dL — ABNORMAL HIGH (ref 70–99)
Glucose-Capillary: 135 mg/dL — ABNORMAL HIGH (ref 70–99)
Glucose-Capillary: 137 mg/dL — ABNORMAL HIGH (ref 70–99)
Glucose-Capillary: 151 mg/dL — ABNORMAL HIGH (ref 70–99)

## 2022-12-15 LAB — MAGNESIUM: Magnesium: 1.9 mg/dL (ref 1.7–2.4)

## 2022-12-15 LAB — TROPONIN I (HIGH SENSITIVITY)
Troponin I (High Sensitivity): 51 ng/L — ABNORMAL HIGH (ref <18)
Troponin I (High Sensitivity): 65 ng/L — ABNORMAL HIGH (ref <18)

## 2022-12-15 LAB — OSMOLALITY, URINE: Osmolality, Ur: 463 mosm/kg (ref 300–900)

## 2022-12-15 LAB — LACTIC ACID, PLASMA
Lactic Acid, Venous: 4.2 mmol/L (ref 0.5–1.9)
Lactic Acid, Venous: 3.9 mmol/L (ref 0.5–1.9)

## 2022-12-15 LAB — TSH: TSH: 0.413 u[IU]/mL (ref 0.350–4.500)

## 2022-12-15 LAB — CREATININE, URINE, RANDOM: Creatinine, Urine: 68 mg/dL

## 2022-12-15 LAB — PHOSPHORUS: Phosphorus: 4.5 mg/dL (ref 2.5–4.6)

## 2022-12-15 LAB — SODIUM, URINE, RANDOM: Sodium, Ur: 45 mmol/L

## 2022-12-15 MED ORDER — LACTATED RINGERS IV BOLUS
750.0000 mL | Freq: Once | INTRAVENOUS | Status: AC
  Administered 2022-12-15: 750 mL via INTRAVENOUS

## 2022-12-15 MED ORDER — ARTIFICIAL TEARS OPHTHALMIC OINT
TOPICAL_OINTMENT | Freq: Three times a day (TID) | OPHTHALMIC | Status: DC
  Administered 2022-12-15: 1 via OPHTHALMIC
  Filled 2022-12-15: qty 3.5

## 2022-12-15 MED ORDER — HYDROCORTISONE SOD SUC (PF) 100 MG IJ SOLR
100.0000 mg | Freq: Two times a day (BID) | INTRAMUSCULAR | Status: DC
  Administered 2022-12-15 – 2022-12-16 (×4): 100 mg via INTRAVENOUS
  Filled 2022-12-15 (×4): qty 2

## 2022-12-15 NOTE — Procedures (Signed)
Central Venous Catheter Insertion Procedure Note  [name]  [id number]  1976-02-22  Date:12/15/22  Time:1:09 PM   Provider Performing:Pete Bea Laura Tanja Port   Procedure: Insertion of Non-tunneled Central Venous Catheter(36556) without US guidance  Indication(s) Medication administration  Consent Risks of the procedure as well as the alternatives and risks of each were explained to the patient and/or caregiver.  Consent for the procedure was obtained and is signed in the bedside chart  Anesthesia Topical only with 1% lidocaine   Timeout Verified patient identification, verified procedure, site/side was marked, verified correct patient position, special equipment/implants available, medications/allergies/relevant history reviewed, required imaging and test results available.  Sterile Technique Maximal sterile technique including full sterile barrier drape, hand hygiene, sterile gown, sterile gloves, mask, hair covering, sterile ultrasound probe cover (if used).  Procedure Description Area of catheter insertion was cleaned with chlorhexidine and draped in sterile fashion.  Without real-time ultrasound guidance a central venous catheter was placed into the right subclavian vein. Nonpulsatile blood flow and easy flushing noted in all ports.  The catheter was sutured in place and sterile dressing applied.  Complications/Tolerance None; patient tolerated the procedure well. Chest X-ray is ordered to verify placement for internal jugular or subclavian cannulation.   Chest x-ray is not ordered for femoral cannulation.  EBL Minimal  Specimen(s) None

## 2022-12-15 NOTE — Progress Notes (Signed)
  Echocardiogram 2D Echocardiogram has been performed.  [name] 12/15/2022, 2:30 PM

## 2022-12-15 NOTE — Progress Notes (Signed)
Daily Progress Note   Patient Name: [name]       Date: 12/15/2022 DOB: 07-29-1975  Age: 47 y.o. MRN#: [id number] Attending Physician: Coralyn Helling, MD Primary Care Physician: Ollen Bowl, MD Admit Date: 12/12/2022  Reason for Consultation/Follow-up: Establishing goals of care  Subjective: Remains on the vent, is on pressors, daughter and sister at bedside.   Length of Stay: 2  Current Medications: Scheduled Meds:   Chlorhexidine Gluconate Cloth  6 each Topical Q2200   hydrocortisone sod succinate (SOLU-CORTEF) inj  100 mg Intravenous Q12H   insulin aspart  0-9 Units Subcutaneous Q4H   mouth rinse  15 mL Mouth Rinse Q2H   pantoprazole (PROTONIX) IV  40 mg Intravenous Q24H    Continuous Infusions:  dexmedetomidine (PRECEDEX) IV infusion 0.4 mcg/kg/hr (12/15/22 1229)   dextrose 5% lactated ringers 125 mL/hr at 12/15/22 1229   midazolam 2 mg/hr (12/15/22 1229)   norepinephrine (LEVOPHED) Adult infusion 7 mcg/min (12/15/22 1229)   piperacillin-tazobactam (ZOSYN)  IV Stopped (12/15/22 0919)    PRN Meds: [DISCONTINUED] acetaminophen **OR** acetaminophen, fentaNYL (SUBLIMAZE) injection, fentaNYL (SUBLIMAZE) injection, [DISCONTINUED] ondansetron **OR** ondansetron (ZOFRAN) IV, mouth rinse  Physical Exam         Intubated   Appears critically ill Monitor noted Has foley Is on the vent  Vital Signs: BP 123/84 (BP Location: Left Arm)   Pulse 88   Temp (!) 97.3 F (36.3 C)   Resp 18   Ht 5\' 4"  (1.626 m)   Wt 59.8 kg   LMP 04/24/2021   SpO2 100%   BMI 22.63 kg/m  SpO2: SpO2: 100 % O2 Device: O2 Device: Ventilator O2 Flow Rate:    Intake/output summary:  Intake/Output Summary (Last 24 hours) at 12/15/2022 1253 Last data filed at 12/15/2022 1229 Gross per 24 hour   Intake 5231.92 ml  Output 1260 ml  Net 3971.92 ml   LBM: Last BM Date :  (PTA) Baseline Weight: Weight: 56.8 kg Most recent weight: Weight: 59.8 kg       Palliative Assessment/Data:      Patient Active Problem List   Diagnosis Date Noted   Septic shock (HCC) 12/14/2022   Non-traumatic compartment syndrome of abdomen 12/14/2022   Exacerbation of Crohn's disease of large intestine (HCC) 12/13/2022   SBO (small bowel obstruction) (HCC) 12/13/2022  Cardiac arrest (HCC) 12/13/2022   History of abdominal subtotal colectomy 11/19/2022   Chronic disease anemia 11/19/2022   Small bowel obstruction (HCC) 11/19/2022   Protein-calorie malnutrition, moderate (HCC) 11/19/2022   Failure to thrive in adult 11/19/2022   Anorexia 11/19/2022   Nausea 11/19/2022   Abnormal CT scan, gastrointestinal tract 11/19/2022   Colonic stricture (HCC) 11/19/2022   Crohn's disease of both small and large intestine with intestinal obstruction (HCC)    History of Crohn's disease    Breast cancer (HCC) 11/09/2020   Port-A-Cath in place 07/15/2020   Hypokalemia 06/30/2020   Genetic testing 06/25/2020   Family history of breast cancer 06/11/2020   Malignant neoplasm of upper-outer quadrant of right breast in female, estrogen receptor positive (HCC) 06/04/2020   Abnormal finding present on diagnostic imaging of uterus 05/07/2019   Generalized abdominal pain    Vitamin D deficiency 09/24/2013   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  Followed up from palliative goals of care discussion done in the evening on 12-14-2022.  At present, family wishes to continue with full code  full scope care and they are aware of the serious, critically ill nature of the patient's current condition.  PMT will follow-up on 12-16-2022.   Code Status:    Code Status Orders  (From admission, onward)           Start     Ordered   12/13/22 0259  Full code  Continuous       Question:  By:  Answer:  Consent: discussion documented in EHR   12/13/22 0300           Code Status History     Date Active Date Inactive Code Status Order ID Comments User Context   12/13/2022 0234 12/13/2022 0300 Full Code [id number]  Carollee Herter, DO ED   11/19/2022 0030 11/23/2022 2014 Full Code [id number]  Tereasa Coop, MD Inpatient   09/27/2022 2118 09/29/2022 1943 Full Code [id number]  Nolberto Hanlon, MD ED   09/05/2021 2127 09/08/2021 1801 Full Code [id number]  Hillary Bow, DO ED   06/07/2021 2024 06/13/2021 1640 Full Code [id number]  Eduard Clos, MD ED   05/27/2021 1656 05/30/2021 2159 Full Code [id number]  Cipriano Bunker, MD ED   01/26/2021 1137 01/28/2021 1820 Full Code [id number]  Jacinto Halim, PA-C ED   11/09/2020 1111 11/10/2020 1506 Full Code [id number]  Allena Napoleon Inpatient   06/30/2020 0359 07/02/2020 2016 Full Code [id number]  Bobette Mo, MD ED   06/27/2019 1753 07/02/2019 1731 Full Code [id number]  Romie Levee, MD Inpatient       Prognosis:  Unable to determine  Discharge Planning: To Be Determined  Care plan was discussed with patient's daughter and sister present at the bedside  Thank you for allowing the Palliative Medicine Team to assist in the care of this patient. MOD MDM     Greater than 50%  of this time was spent counseling and coordinating care related to the above assessment and plan.  Rosalin Hawking, MD  Please contact Palliative Medicine Team phone at [phone] for questions and concerns.

## 2022-12-15 NOTE — Progress Notes (Addendum)
Initial Nutrition Assessment  DOCUMENTATION CODES:   Non-severe (moderate) malnutrition in context of chronic illness  INTERVENTION:  - Concern for malabsorption issues as patient documented to only have ~105cm small bowel remaining.  - Would recommend starting TPN within the next 1-2 days.   - Patient will likely require TPN long term.  - Monitor magnesium, potassium, and phosphorus BID for at least 3 days, MD to replete as needed, as pt is at risk for refeeding syndrome given malnutrition with severe weight loss over the past 4 months. - Recommend 100mg  thiamine for at least 5 days.  - Will monitor for nutrition plans as well as palliative care discussions.  NUTRITION DIAGNOSIS:   Moderate Malnutrition related to chronic illness (Crohn's disease; metastatic breast cancer) as evidenced by moderate fat depletion, moderate muscle depletion, percent weight loss (24% in 4.5 months).  GOAL:   Patient will meet greater than or equal to 90% of their needs  MONITOR:   Vent status, Labs, Weight trends, I & O's  REASON FOR ASSESSMENT:   Ventilator    ASSESSMENT:   47 y.o. female with PMH of recurrent breast cancer not currently on treatment, Crohn's disease s/p subtotal colectomy in 2021, recurrent obstructions, colonic stricture, recent hospitalization 8/10-8/15/2024 for obstruction who presented on 12/12/2022 with abdominal pain, nausea, vomiting diarrhea.   9/4 Admit; found down in ED bathroom with no pulse, intubated 9/5 s/p ex-lap, found to have internal hernia with small bowel ischemia, only 105cm of small bowel remaining  Patient intubated and sedated at time of visit. Remains on levo but vasopressin off this AM. NGT in place to LIS.   Daughter at bedside. She is unsure of her mom's UBW but states the patient had been telling her she had been losing weight. Likely over the past few months.  Per EMR, patient weighed at 134# in April and weight has down trended to lowest weight  of 102# on 8/28. This is a 32# or 24% in 4.5 months, which is significant for the time frame.  Of note, current weight is significantly elevated at 131# but suspect this is related to fluid as patient is +14L. Will utilize weight of 46.3kg from 8/28 as suspect this is more accurate.   Daughter unsure how patient has been eating at home recently but notes she had endorsed poor appetite so likely has been eating less. Thinks she drinks a protein shake at home but unsure of brand.   Surgery took patient to OR and found necrotic portions of small bowel. Patient had wound vac placed and left in discontinuity. Plan to return to OR tomorrow for closure, pending palliative care's discussion with family.   Pharmacist reached out to this RD to discuss possible plans for TPN. Per CCM, no plans to start today. Suspect could start over the weekend pending palliative care discussions.   Patient noted to only have ~105cm of small bowel remaining. She has had a subtotal colectomy in the past. 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 As a result of this, she will likely TPN long term to meet nutritional needs.    Medications reviewed and include: Insulin, D5 @ 117mL/hr (provides 510 kcals over 24 hours) Precedex Versed Levophed @ 62mcg/min  Labs reviewed:  Na 127 Creatinine 2.11 HA1C 5.7   NUTRITION - FOCUSED PHYSICAL EXAM:  Flowsheet Row Most Recent Value  Orbital Region Moderate depletion  Upper Arm Region No depletion  Thoracic and Lumbar Region Moderate depletion  Buccal Region No depletion  Temple Region Mild depletion  Clavicle Bone Region Moderate depletion  Clavicle and Acromion Bone Region Moderate depletion  Scapular Bone Region Unable to assess  Dorsal Hand No depletion  Patellar Region No depletion  Anterior Thigh Region No depletion  Posterior Calf Region No depletion  Edema (RD Assessment) Mild  Hair Reviewed  Eyes Reviewed  Mouth Reviewed  Skin Reviewed  Nails  Reviewed       Diet Order:   Diet Order             Diet NPO time specified  Diet effective now                   EDUCATION NEEDS:  Education needs have been addressed  Skin:  Skin Assessment: Skin Integrity Issues: Skin Integrity Issues:: Incisions Incisions: Abdomen  Last BM:  PTA  Height:  Ht Readings from Last 1 Encounters:  12/13/22 5\' 4"  (1.626 m)   Weight:  Wt Readings from Last 1 Encounters:  12/15/22 59.8 kg  *Elevated 2/2 fluid (+14L) Utilizing weight from 8/28 of 46.3kg   BMI:  Body mass index is 22.63 kg/m.  Estimated Nutritional Needs:  Kcal:  1400-1600 kcals Protein:  85-95 grams Fluid:  >/= 1.4L    Shelle Iron RD, LDN For contact information, refer to [facility].

## 2022-12-15 NOTE — Progress Notes (Signed)
Progress Note  1 Day Post-Op  Subjective: Taken to the OR yesterday, where a significant amount of necrotic small bowel was discovered.  She remains intubated and sedated on 2 pressors. Minimal urine output.    Objective: Vital signs in last 24 hours: Temp:  [96.6 F (35.9 C)-99.7 F (37.6 C)] 99.5 F (37.5 C) (09/06 0600) Pulse Rate:  [93-103] 94 (09/06 0600) Resp:  [16-36] 17 (09/06 0600) BP: (89-127)/(71-101) 113/88 (09/06 0400) SpO2:  [92 %-100 %] 99 % (09/06 0751) Arterial Line BP: (87-151)/(56-88) 122/76 (09/06 0600) FiO2 (%):  [30 %-40 %] 30 % (09/06 0751) Weight:  [59.8 kg] 59.8 kg (09/06 0500) Last BM Date :  (pta)  Intake/Output from previous day: 09/05 0701 - 09/06 0700 In: 5097.4 [I.V.:3919.4; IV Piggyback:1178] Out: 1080 [Urine:565; Drains:500; Blood:15] Intake/Output this shift: No intake/output data recorded.  PE: General: WD female who is intubated and critically ill Heart: regular, rate, and rhythm.  Normal s1,s2. No obvious murmurs, gallops, or rubs noted.  Palpable pedal pulses bilaterally Lungs: vent sounds bilaterally Abd: Abthera in place with SS output GU: foley present with minimal urine that appears somewhat concentrated MS: sedated   Lab Results:  Recent Labs    12/14/22 0718 12/15/22 0505  WBC 19.7* 25.1*  HGB 11.7* 10.7*  HCT 34.7* 32.0*  PLT 179 126*   BMET Recent Labs    12/14/22 0445 12/15/22 0505  NA 127* 127*  K 4.7 4.3  CL 100 100  CO2 14* 14*  GLUCOSE 147* 176*  BUN 31* 32*  CREATININE 1.95* 2.11*  CALCIUM 7.7* 7.3*   PT/INR Recent Labs    12/13/22 1038 12/14/22 0445  LABPROT 17.4* 16.6*  INR 1.4* 1.3*   CMP     Component Value Date/Time   NA 127 (L) 12/15/2022 0505   NA 135 01/21/2020 1056   K 4.3 12/15/2022 0505   CL 100 12/15/2022 0505   CO2 14 (L) 12/15/2022 0505   GLUCOSE 176 (H) 12/15/2022 0505   BUN 32 (H) 12/15/2022 0505   BUN 12 01/21/2020 1056   CREATININE 2.11 (H) 12/15/2022 0505    CREATININE 0.91 09/21/2022 1141   CALCIUM 7.3 (L) 12/15/2022 0505   PROT 4.0 (L) 12/15/2022 0505   ALBUMIN 1.7 (L) 12/15/2022 0505   AST 123 (H) 12/15/2022 0505   AST 18 09/21/2022 1141   ALT 60 (H) 12/15/2022 0505   ALT 12 09/21/2022 1141   ALKPHOS 67 12/15/2022 0505   BILITOT 1.9 (H) 12/15/2022 0505   BILITOT 0.4 09/21/2022 1141   GFRNONAA 29 (L) 12/15/2022 0505   GFRNONAA >60 09/21/2022 1141   GFRAA 108 01/21/2020 1056   Lipase     Component Value Date/Time   LIPASE 19 12/12/2022 2130       Studies/Results: VAS [place] LOWER EXTREMITY VENOUS (DVT)  Result Date: 12/15/2022  Lower Venous DVT Study Patient Name:  [name]  Date of Exam:   12/14/2022 Medical Rec #: [id number]         Accession #:    [id number] Date of Birth: 1975/10/27         Patient Gender: F Patient Age:   47 years Exam Location:  [facility] Procedure:      VAS [place] LOWER EXTREMITY VENOUS (DVT) Referring Phys: Levon Hedger --------------------------------------------------------------------------------  Indications: Edema.  Risk Factors: None identified. Limitations: Bandages and patient positioning. Comparison Study: No prior studies. Performing Technologist: Chanda Busing RVT  Examination Guidelines: A complete evaluation includes B-mode imaging,  spectral Doppler, color Doppler, and power Doppler as needed of all accessible portions of each vessel. Bilateral testing is considered an integral part of a complete examination. Limited examinations for reoccurring indications may be performed as noted. The reflux portion of the exam is performed with the patient in reverse Trendelenburg.  +---------+---------------+---------+-----------+----------+-------------------+ RIGHT    CompressibilityPhasicitySpontaneityPropertiesThrombus Aging      +---------+---------------+---------+-----------+----------+-------------------+ CFV                                                   Not well visualized  +---------+---------------+---------+-----------+----------+-------------------+ SFJ                                                   Not well visualized +---------+---------------+---------+-----------+----------+-------------------+ FV Prox  Full           Yes      Yes                                      +---------+---------------+---------+-----------+----------+-------------------+ FV Mid   Full                                                             +---------+---------------+---------+-----------+----------+-------------------+ FV DistalFull                                                             +---------+---------------+---------+-----------+----------+-------------------+ PFV                                                   Not well visualized +---------+---------------+---------+-----------+----------+-------------------+ POP      Full           Yes      Yes                                      +---------+---------------+---------+-----------+----------+-------------------+ PTV      Full                                                             +---------+---------------+---------+-----------+----------+-------------------+ PERO     Full                                                             +---------+---------------+---------+-----------+----------+-------------------+   +---------+---------------+---------+-----------+----------+--------------+  LEFT     CompressibilityPhasicitySpontaneityPropertiesThrombus Aging +---------+---------------+---------+-----------+----------+--------------+ CFV      Full           Yes      Yes                                 +---------+---------------+---------+-----------+----------+--------------+ SFJ      Full                                                        +---------+---------------+---------+-----------+----------+--------------+ FV Prox  Full                                                         +---------+---------------+---------+-----------+----------+--------------+ FV Mid   Full                                                        +---------+---------------+---------+-----------+----------+--------------+ FV DistalFull                                                        +---------+---------------+---------+-----------+----------+--------------+ PFV      Full                                                        +---------+---------------+---------+-----------+----------+--------------+ POP      Full           Yes      Yes                                 +---------+---------------+---------+-----------+----------+--------------+ PTV      Full                                                        +---------+---------------+---------+-----------+----------+--------------+ PERO     Full                                                        +---------+---------------+---------+-----------+----------+--------------+     Summary: RIGHT: - There is no evidence of deep vein thrombosis in the lower extremity. However, portions of this examination were limited- see technologist comments above.  - No cystic structure found in the popliteal fossa.  LEFT: - There is no evidence  of deep vein thrombosis in the lower extremity.  - No cystic structure found in the popliteal fossa.  *See table(s) above for measurements and observations. Electronically signed by Gerarda Fraction on 12/15/2022 at 8:40:38 AM.    Final    DG Chest Port 1 View  Result Date: 12/15/2022 CLINICAL DATA:  47 year old female with history of respiratory failure. EXAM: PORTABLE CHEST 1 VIEW COMPARISON:  Chest x-ray 12/14/2022. FINDINGS: An endotracheal tube is in place with tip 3.7 cm above the carina. Nasogastric tube extends into the proximal stomach with side port near the gastroesophageal junction. Left internal jugular single-lumen porta cath with tip  terminating at the superior cavoatrial junction. Lung volumes are low. Patchy ill-defined opacities noted throughout the mid to lower lungs bilaterally (right greater than left). No definite pleural effusions. No pneumothorax. No evidence of pulmonary edema. Heart size is normal. Upper mediastinal contours are within normal limits. IMPRESSION: 1. Support apparatus, as above. Consider advancement of the nasogastric tube approximately 5-10 cm for more optimal placement within the stomach (side port is near the gastroesophageal junction). 2. Persistent ill-defined opacities throughout the mid to lower lungs bilaterally, which may reflect areas of atelectasis and/or consolidation. Electronically Signed   By: Trudie Reed M.D.   On: 12/15/2022 07:28   DG Chest 1 View  Result Date: 12/14/2022 CLINICAL DATA:  Positioning of the ETT EXAM: CHEST  1 VIEW COMPARISON:  12/13/2022 FINDINGS: Opacity in the left base consistent with layering effusion or consolidation. Expiratory view was obtained with vascular crowding. No pneumothorax identified. Incidental note is made of distended loops of bowel. Endotracheal tube tip just below thoracic inlet. Left-sided Port-A-Cath tip distal SVC. NG tube tip superimposed with stomach. IMPRESSION: Persistent left basilar consolidation or layering effusion. Expiratory view with vascular crowding. Electronically Signed   By: Layla Maw M.D.   On: 12/14/2022 08:26   DG Abd 1 View  Result Date: 12/13/2022 CLINICAL DATA:  Emesis. EXAM: ABDOMEN - 1 VIEW COMPARISON:  Radiograph and CT earlier today FINDINGS: Tip and side port of the enteric tube below the diaphragm in the stomach. There is diffuse gaseous bowel distension without significant interval change. Enteric sutures noted in the left abdomen, post subtotal colectomy on prior cross-sectional exam. No obvious free air. Right femoral catheter tip projects in the expected location of the right common femoral vessels. IMPRESSION:  1. Tip and side port of the enteric tube below the diaphragm in the stomach. 2. Diffuse gaseous bowel distension without significant interval change. Electronically Signed   By: Narda Rutherford M.D.   On: 12/13/2022 22:21   ECHOCARDIOGRAM LIMITED  Result Date: 12/13/2022    ECHOCARDIOGRAM LIMITED REPORT   Patient Name:   [name] Date of Exam: 12/13/2022 Medical Rec #:  [id number]        Height:       64.0 in Accession #:    [id number]       Weight:       102.0 lb Date of Birth:  1976/01/22        BSA:          1.469 m Patient Age:    47 years         BP:           98/82 mmHg Patient Gender: F                HR:           103 bpm. Exam Location:  Inpatient Procedure:  Limited Echo, Limited Color Doppler and Cardiac Doppler Indications:    Cardiac arrest I46.9  History:        Patient has prior history of Echocardiogram examinations, most                 recent 11/19/2022. Bowel obstruction; Risk Factors:Former Smoker.                 H/o breast cancer.  Sonographer:    Dondra Prader RVT RCS Referring Phys: [id number] Lorin Glass  Sonographer Comments: Technically challenging study due to limited acoustic windows, Technically difficult study due to poor echo windows, no subcostal window and echo performed with patient supine and on artificial respirator. Image acquisition challenging due to patient body habitus, Image acquisition challenging due to breast implants and Image acquisition challenging due to respiratory motion. IMPRESSIONS  1. Windows are challenging, very limited study. Left ventricular ejection fraction, by estimation, is 50 to 55%. The left ventricle has low normal function.  2. Right ventricular systolic function is mildly reduced. The right ventricular size is moderately enlarged.  3. The mitral valve is normal in structure. Trivial mitral valve regurgitation.  4. Aortic valve regurgitation is not visualized.  5. The inferior vena cava IVC not well visualized. Conclusion(s)/Recommendation(s):  RV appears more dilated than prior study , recommend assessment for PE. FINDINGS  Left Ventricle: Windows are challenging, very limited study. Left ventricular ejection fraction, by estimation, is 50 to 55%. The left ventricle has low normal function. There is no left ventricular hypertrophy. Right Ventricle: The right ventricular size is moderately enlarged. Right ventricular systolic function is mildly reduced. Mitral Valve: The mitral valve is normal in structure. Trivial mitral valve regurgitation. Tricuspid Valve: Tricuspid valve regurgitation is mild. Aortic Valve: Aortic valve regurgitation is not visualized. Venous: The inferior vena cava IVC not well visualized. LEFT VENTRICLE PLAX 2D LVIDd:         3.10 cm LVIDs:         2.30 cm LV PW:         0.80 cm LV IVS:        0.80 cm  LEFT ATRIUM         Index LA diam:    2.50 cm 1.70 cm/m  TRICUSPID VALVE TR Peak grad:   15.1 mmHg TR Vmax:        194.00 cm/s Carolan Clines Electronically signed by Carolan Clines Signature Date/Time: 12/13/2022/12:54:16 PM    Final     Anti-infectives: Anti-infectives (From admission, onward)    Start     Dose/Rate Route Frequency Ordered Stop   12/13/22 1000  piperacillin-tazobactam (ZOSYN) IVPB 3.375 g        3.375 g 12.5 mL/hr over 240 Minutes Intravenous Every 8 hours 12/13/22 0272          Assessment/Plan  Crohn's disease s/p subtotal colectomy now s/p ex-lap and SBR for internal hernia - Strict NPO.  Patient is in discontinuity - approximately 105cm of small bowel remaining - Tentative plan for return to OR for closure tomorrow pending discussion with family - Continue abx - Remainder of care per ICU   LOS: 2 days   I reviewed Consultant GI notes, last 24 h vitals and pain scores, last 48 h intake and output, last 24 h labs and trends, last 24 h imaging results, and CCM notes .   Moise Boring, MD [facility] Surgery 12/15/2022, 9:23 AM Please see Amion for pager number during day hours  7:00am-4:30pm

## 2022-12-15 NOTE — Progress Notes (Signed)
eLink Physician-Brief Progress Note Patient Name: [name] DOB: 06-06-1975 MRN: [id number]   Date of Service  12/15/2022  HPI/Events of Note  47 year old supposedly postmenopausal female that had what appears to be "menstrual like bleeding ".  No obvious rectal bleeding.  Mild anemia.  Hemodynamically stable.  eICU Interventions  Depending on bedside exam, this could represent dysmenorrhea, rectovaginal fistula, rectal bleeding, or any number of other possibilities.  Recommend ground team evaluation in the morning.  No indication for transfusion at this time.     Intervention Category Minor Interventions: Clinical assessment - ordering diagnostic tests  [name] 12/15/2022, 11:07 PM

## 2022-12-15 NOTE — Progress Notes (Signed)
Daily Progress Note  DOA: 12/12/2022 Hospital Day: 4  Chief Complaint: Abdominal pain, hx of Crohns   ASSESSMENT & PLAN   Brief Narrative:  [name] is a 47 y.o. year old female with a history of Crohn's disease s/p subtotal colectomy in 2021 recurrent obstructions, colonic stricture, IDA who presented to the emergency department on 12/12/2022 with abdominal pain, nausea, vomiting, diarrhea. In the ED tachycardic, hypertensive. ED provider notes abdominal distention. She was given 2L IVF, morphine, IV PPI. Labs with WBC 10.8, Hgb 10.7, K 3.2. CT AP was ordered and showed markedly dilated distal small bowel consistent with chronic obstruction. No changes since prior study. Patient received Entyvio infusion on 8/28 and tapering her oral prednisone. Patient refused IV steroids in ER. They consulted GI for input.    Crohn's disease, s/p subtotal colectomy with ileal colonic anastomosis narrowing, on Entyvio and prednisone taper. Patient was most recently admitted into hospital on 8/11 with recurrent SBO.CTAP with contrast 11/18/2022 showed evidence of past subtotal colectomy with marked long segment small bowel dilation demonstrating mild mural thickening with abrupt caliber transition near the ileosigmoid anastomosis, suspicious for SBO.  Flex sig on 8/13 showed severe stenosis in the ileosigmoid anastomosis. She was discharged home on 8/15 with Prednisone taper with plans to restart Entyvio. She received her first infusion of Entyvio on 8/28.  She had been tapering her prednisone and down to 20 mg QD, and readmitted for severe abdominal pain on 9/4. NG tube was placed in ER upon admission and 250cc output reported by RN first night. Repeat CT A/P showed worsening severe small bowel dilatation noted with air-fluid levels concerning for distal SBO. Overnight increasing pressor requirements. Abdominal pressure > 20 while paralyzed and she was making minimal urine despite ongoing resuscitation.  Patient had Exp Lap with SB resection (9/5). During operation internal henria in small bowel ischemia. Approx 105cm of SB remaining after necrotic portions were excised. Abthera Vac applied. Per RN 150cc output. NG tube 0 output. - Strict NPO - consider clamping NG tube - IV Steroid therapy - Holding Entyvio - Per surgery tentative plan for return to OR for closure tomorrow pending discussion w/ family  Sepsis/PEA arrest, coded in ED and now intubated in ICU. Increasing WBC-10.8> 18.1>19.7>25.1. Initially elevated lactic acid, now stable 12.0>3.8.Marland Kitchen CBC stable - Hgb 10.7>5.8>11.7>10.7. On Zosyn IV.   Acute on chronic anemia Hgb stable at 10.7>5.8> given 3 PRBCs and 3 FFP now 11.7. Her baseline is 10-11.History of IDA. Last Iron 22 (11/20/22).    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 -managed by oncology and Dr. Trudee Kuster.   Subjective   Patient unable to participate in subjective history. Intubated. Patients daughter and sister at bedside. Per RN Wound vac 150cc and NG tube 0.  Objective   Procedure: EXPLORATORY LAPAROTOMY WITH SMALL BOWEL RESECTION  (12/14/22)  Operative Findings: Internal hernia resulting in small bowel ischemia.  Approximately 105cm of small bowel remaining after the frankly necrotic portions were excised.    Recent Labs    12/14/22 0329 12/14/22 0718 12/15/22 0505  WBC 18.1* 19.7* 25.1*  HGB 12.7 11.7* 10.7*  HCT 37.5 34.7* 32.0*  PLT 195 179 126*   BMET Recent Labs    12/13/22 2155 12/14/22 0445 12/15/22 0505  NA 134* 127* 127*  K 4.7 4.7 4.3  CL 106 100 100  CO2 16* 14* 14*  GLUCOSE 116*  147* 176*  BUN 26* 31* 32*  CREATININE 1.92* 1.95* 2.11*  CALCIUM 8.0* 7.7* 7.3*   LFT Recent Labs    12/15/22 0505  PROT 4.0*  ALBUMIN 1.7*  AST 123*  ALT 60*  ALKPHOS 67  BILITOT 1.9*   PT/INR Recent Labs    12/13/22 1038 12/14/22 0445  LABPROT 17.4*  16.6*  INR 1.4* 1.3*     Imaging:  VAS [place] LOWER EXTREMITY VENOUS (DVT)  Lower Venous DVT Study  Patient Name:  [name]  Date of Exam:   12/14/2022 Medical Rec #: [id number]         Accession #:    [id number] Date of Birth: 09-18-1975         Patient Gender: F Patient Age:   70 years Exam Location:  [facility] Procedure:      VAS [place] LOWER EXTREMITY VENOUS (DVT) Referring Phys: Levon Hedger  --------------------------------------------------------------------------------   Indications: Edema.   Risk Factors: None identified. Limitations: Bandages and patient positioning. Comparison Study: No prior studies.  Performing Technologist: Chanda Busing RVT    Examination Guidelines: A complete evaluation includes B-mode imaging, spectral Doppler, color Doppler, and power Doppler as needed of all accessible portions of each vessel. Bilateral testing is considered an integral part of a complete examination. Limited examinations for reoccurring indications Volney Reierson be performed as noted. The reflux portion of the exam is performed with the patient in reverse Trendelenburg.     +---------+---------------+---------+-----------+----------+-------------------+ RIGHT    CompressibilityPhasicitySpontaneityPropertiesThrombus Aging      +---------+---------------+---------+-----------+----------+-------------------+ CFV                                                   Not well visualized +---------+---------------+---------+-----------+----------+-------------------+ SFJ                                                   Not well visualized +---------+---------------+---------+-----------+----------+-------------------+ FV Prox  Full           Yes      Yes                                      +---------+---------------+---------+-----------+----------+-------------------+ FV Mid   Full                                                              +---------+---------------+---------+-----------+----------+-------------------+ FV DistalFull                                                             +---------+---------------+---------+-----------+----------+-------------------+ PFV  Not well visualized +---------+---------------+---------+-----------+----------+-------------------+ POP      Full           Yes      Yes                                      +---------+---------------+---------+-----------+----------+-------------------+ PTV      Full                                                             +---------+---------------+---------+-----------+----------+-------------------+ PERO     Full                                                             +---------+---------------+---------+-----------+----------+-------------------+        +---------+---------------+---------+-----------+----------+--------------+ LEFT     CompressibilityPhasicitySpontaneityPropertiesThrombus Aging +---------+---------------+---------+-----------+----------+--------------+ CFV      Full           Yes      Yes                                 +---------+---------------+---------+-----------+----------+--------------+ SFJ      Full                                                        +---------+---------------+---------+-----------+----------+--------------+ FV Prox  Full                                                        +---------+---------------+---------+-----------+----------+--------------+ FV Mid   Full                                                        +---------+---------------+---------+-----------+----------+--------------+ FV DistalFull                                                        +---------+---------------+---------+-----------+----------+--------------+ PFV      Full                                                         +---------+---------------+---------+-----------+----------+--------------+ POP      Full           Yes  Yes                                 +---------+---------------+---------+-----------+----------+--------------+ PTV      Full                                                        +---------+---------------+---------+-----------+----------+--------------+ PERO     Full                                                        +---------+---------------+---------+-----------+----------+--------------+             Summary: RIGHT:  - There is no evidence of deep vein thrombosis in the lower extremity. However, portions of this examination were limited- see technologist comments above.   - No cystic structure found in the popliteal fossa.   LEFT:  - There is no evidence of deep vein thrombosis in the lower extremity.   - No cystic structure found in the popliteal fossa.    *See table(s) above for measurements and observations.  Electronically signed by Gerarda Fraction on 12/15/2022 at 8:40:38 AM.      Final   DG Chest Port 1 View CLINICAL DATA:  47 year old female with history of respiratory failure.  EXAM: PORTABLE CHEST 1 VIEW  COMPARISON:  Chest x-ray 12/14/2022.  FINDINGS: An endotracheal tube is in place with tip 3.7 cm above the carina. Nasogastric tube extends into the proximal stomach with side port near the gastroesophageal junction. Left internal jugular single-lumen porta cath with tip terminating at the superior cavoatrial junction. Lung volumes are low. Patchy ill-defined opacities noted throughout the mid to lower lungs bilaterally (right greater than left). No definite pleural effusions. No pneumothorax. No evidence of pulmonary edema. Heart size is normal. Upper mediastinal contours are within normal limits.  IMPRESSION: 1. Support apparatus, as above. Consider advancement of  the nasogastric tube approximately 5-10 cm for more optimal placement within the stomach (side port is near the gastroesophageal junction). 2. Persistent ill-defined opacities throughout the mid to lower lungs bilaterally, which Georges Victorio reflect areas of atelectasis and/or consolidation.  Electronically Signed   By: Trudie Reed M.D.   On: 12/15/2022 07:28     Scheduled inpatient medications:   Chlorhexidine Gluconate Cloth  6 each Topical Q2200   insulin aspart  0-9 Units Subcutaneous Q4H   methylPREDNISolone (SOLU-MEDROL) injection  60 mg Intravenous Q24H   mouth rinse  15 mL Mouth Rinse Q2H   pantoprazole (PROTONIX) IV  40 mg Intravenous Q24H   Continuous inpatient infusions:   dexmedetomidine (PRECEDEX) IV infusion 0.4 mcg/kg/hr (12/15/22 0944)   dextrose 5% lactated ringers 125 mL/hr at 12/15/22 0944   midazolam 2 mg/hr (12/15/22 0944)   norepinephrine (LEVOPHED) Adult infusion 4 mcg/min (12/15/22 0944)   piperacillin-tazobactam (ZOSYN)  IV Stopped (12/15/22 0919)   vasopressin 0.03 Units/min (12/15/22 0944)   PRN inpatient medications: [DISCONTINUED] acetaminophen **OR** acetaminophen, fentaNYL (SUBLIMAZE) injection, fentaNYL (SUBLIMAZE) injection, [DISCONTINUED] ondansetron **OR** ondansetron (ZOFRAN) IV, mouth rinse  Vital signs in last 24 hours: Temp:  [96.6 F (35.9 C)-99.7 F (37.6 C)] 98.1 F (36.7 C) (  09/06 0917) Pulse Rate:  [87-103] 87 (09/06 0917) Resp:  [16-36] 19 (09/06 0917) BP: (89-127)/(71-101) 122/84 (09/06 0800) SpO2:  [92 %-100 %] 100 % (09/06 0917) Arterial Line BP: (91-151)/(56-88) 103/60 (09/06 0917) FiO2 (%):  [30 %-40 %] 30 % (09/06 0917) Weight:  [59.8 kg] 59.8 kg (09/06 0500) Last BM Date :  (PTA)  Intake/Output Summary (Last 24 hours) at 12/15/2022 0956 Last data filed at 12/15/2022 0944 Gross per 24 hour  Intake 5010 ml  Output 1250 ml  Net 3760 ml    Intake/Output from previous day: 09/05 0701 - 09/06 0700 In: 5097.4 [I.V.:3919.4; IV  Piggyback:1178] Out: 1080 [Urine:565; Drains:500; Blood:15] Intake/Output this shift: Total I/O In: 595 [I.V.:553.3; IV Piggyback:41.7] Out: 200 [Urine:50; Drains:150]   Physical Exam:  General: chronically ill appearing, intubated  Heart:  Regular rate and rhythm.  Pulmonary: vent sounds bilaterally Abdomen: woundvac mid ABD, hypoactive bowel sounds, NG output bilious  Extremities: No lower extremity edema  GU: foley present with minimal urine    Principal Problem:   Exacerbation of Crohn's disease of large intestine (HCC) Active Problems:   Malignant neoplasm of upper-outer quadrant of right breast in female, estrogen receptor positive (HCC)   Hypokalemia   Crohn's disease of both small and large intestine with intestinal obstruction (HCC)   Small bowel obstruction (HCC)   Colonic stricture (HCC)   SBO (small bowel obstruction) (HCC)   Cardiac arrest (HCC)   Septic shock (HCC)   Non-traumatic compartment syndrome of abdomen    LOS: 2 days   Margarite Gouge Nakesha Ebrahim ,NP 12/15/2022, 9:56 AM

## 2022-12-15 NOTE — Consult Note (Addendum)
WOC Nurse ostomy follow up Pt did not receive an ostomy during surgery yesterday.  Abthera Vac applied in the OR by the surgical team; intact with good seal to cont suction. Discussed plan of care with surgical team via Secure chat.  Dr Audrie Lia plans to take her back to surgery for closure tomorrow.  No further role for the WOC team at this time; surgical team is following for assessment and plan of care.  Please re-consult if further assistance is needed.  Thank-you,  Cammie Mcgee MSN, RN, CWOCN, [place], CNS [phone]

## 2022-12-15 NOTE — Progress Notes (Addendum)
NAME:  [name], MRN:  [id number], DOB:  Aug 23, 1975, LOS: 2 ADMISSION DATE:  12/12/2022, CONSULTATION DATE:  12/13/22 REFERRING MD:  Rubin Payor, MD CHIEF COMPLAINT:  cardiac arrest    History of Present Illness:  47 year old female with past medical history of recurrent breast cancer not currently on treatment, Crohn's disease s/p subtotal colectomy in 2021 recurrent obstructions, colonic stricture, IDA, recent hospitalization 8/10-8/15/2024 for obstruction seen by GI and general surgery. Underwent EGD and flexible sigmoidoscopy and discharged on steroids who presented to the emergency department on 12/12/2022 with abdominal pain, nausea, vomiting diarrhea. In the ED tachycardic, hypertensive. ED provider notes abdominal distention. She was given 2L IVF, morphine, IV PPI. Labs with WBC 10.8, Hgb 10.7, K 3.2. CT AP was ordered and showed markedly dilated distal small bowel consistent with chronic obstruction. No changes since prior study. Patient was admitted to [facility]. They consulted GI for input. Unfortunately, around 0820 this morning patient was found on the floor in the bathroom in ED pulseless. They estimate downtime around 5 minutes. ACLS was initiated. Concern for possible torsades de pointes and given IV mag, defibrillated. Coded for around 5 minutes. She was intubated. She had copious vomitus in the ETT. ROSC achieved and ICU was consulted. Notably, she had I-stat peri-code which showed hemoglobin of 5.5 down from 10 overnight.   Pertinent  Medical History  Crohn's disease with previous bowel obstruction s/p subtotal colectomy 2021, colonic stricture, IDA, breast cancer s/p mastectomy 2022  Significant Hospital Events: Including procedures, antibiotic start and stop dates in addition to other pertinent events   9/3: admitted to [facility] for abdominal distention, presumed Crohn's exacerbation 9/4: ~0820 cardiac arrest in ED. Admitted to ICU intubated, sedated. Hgb 5.5. Placed right femoral CVC.  Received 3 UPRBC, 3 FFP. 9/5: - Hgb stable at 11.7 after 3u PRBC, 3 FPP 9/5 overnight increasing pressor requirements - added vaso. Bladder pressures 28, went for exploratory lap w/ sb resxn, and celiotomy, intra op findings: internal hernia w/ associated small bowel ischemia. Also seen by GI and palliative. Full scope of care desired  9/6 some ST changes in lateral leads. Weaning pressors. Removing femoral line and placing internal jugular. Renal fxn worse, on-going metabolic acidosis.  Interim History / Subjective:  Sedated on vent , weaning pressors   Objective   Blood pressure 113/88, pulse 94, temperature 99.5 F (37.5 C), resp. rate 17, height 5\' 4"  (1.626 m), weight 59.8 kg, last menstrual period 04/24/2021, SpO2 99%.    Vent Mode: PRVC FiO2 (%):  [30 %-40 %] 30 % Set Rate:  [16 bmp] 16 bmp Vt Set:  [440 mL] 440 mL PEEP:  [5 cmH20] 5 cmH20 Plateau Pressure:  [16 cmH20-32 cmH20] 16 cmH20   Intake/Output Summary (Last 24 hours) at 12/15/2022 0836 Last data filed at 12/15/2022 0700 Gross per 24 hour  Intake 4414.99 ml  Output 1080 ml  Net 3334.99 ml   Filed Weights   12/14/22 0500 12/15/22 0500  Weight: 56.8 kg 59.8 kg    Examination: General this is a 47 year old female who is currently sedated on the vent HENT NCAT no JVD MMM orally intubated Pulm clear. No accessory use. ABG reviewed. PCO2 27 on set rate of 16, she is NOT over breathing  PCXR w/ R> L interstitial haziness  Card RRR w/out MRG. Mild ST elevation lateral leads Abd soft, wound vac intact hypoactive Ext cool to touch. Pulses weak. No sig edema  Neuro sedated on vent   [facility]  Problem list   Cardiac arrest   Assessment & Plan:  Septic shock w/ lactic acidosis 2/2 bacterial translocation from ischemic gut  Wbc remains elevated Pressor needs: coming down  Plan Day 3 zosyn Place internal jugular get CVP and co-ox in addition to f/u lactate  Change steroids to stress dose steroids Cont to wean  pressors, should be able to get vasopressin off today   ST Elevation in lateral leads Plan Cycle CEs ECHO Not candidate for heparin given bleeding and surgery, if she does have Wall motion abnormality may be worth reconsideration  Not candidate for BB given shock   Small Bowel Obstruction 2/2 internal hernia and resultant ischemic gut now s/p exploratory lap, celiotomy and SB rsxn 9/5 the patient is still in Discontinuity  Plan Wound vac rx per surgery  Hopefully back to OR tomorrow  Abx as outlined above Will likely need TPN  Crohn's disease w/ prior subtotal colectomy with ileal colonic anastomosis on Entyvio and chronic steroids.  Plan See steroid plan above  Holding Entryvio   Acute hypoxic respiratory failure 2/2 cardiac arrest and aspiration  Not ready for weaning given complexity of metabolic derangements and on-going shock and also the patient is still in discontinuity w/ plan to return to OR Plan Cont full vent support Repeating ABG now, and again this evening. As shock state improves expect lactate to drop and thus metabolic acidosis will improve. Current CO2 27 so will be high risk of resp alk VAP bundle  PAD protocol RASS goal  -3 for now  Am CXR Abx as above (zosyn treating abd and aspiration)   AKI 2/2 sepsis and end-organ hypoperfusion  -scr rising still  Plan MAP goal > 65 CVP goal 8-12 Renal dose meds Strict I&O No hard indication for dialysis however if acid base worsens, volume status overloaded, or hyperkalemia becomes a issue would offer CRRT sooner rather than later   Fluid,electrolyte and acid base imbalance: hyponatremia, (declining since admit), mixed normal anion gap acidosis w/ prob resolving lactic acidosis  Plan Ck urine Osmo, Na and creatinine  Ck TSH  Will cont LR for now, repeat ABG and chemistry in addition to lactate, If lactate still falling but remains acidotic will add change MIVF to bicarb   Acute on chronic anemia; history of IDA  and rectal bleeding New imaging without obvious bleed. No melena or BRBPR noted. No hematemesis.  Plan Trend CBC Transfuse for hgb < 7 or active bleeding   Mild post-operative thrombocytopenia Plan Trend   Hyperglycemia Plan SSI goal 140-180   Shock liver  Plan Trend LFTs  Recurrent breast cancer not currently on treatment  - per Dr. Pamelia Hoit note 10/11/22, patient has declined further treatment at this time.  Plan to have follow-up in September to discuss anti-estrogen therapy.     Best Practice (right click and "Reselect all SmartList Selections" daily)   Diet/type: NPO DVT prophylaxis: SCD - anemic - receiving PRBC GI prophylaxis: PPI Lines: Central line and yes and it is still needed Foley:  Yes, and it is still needed Code Status:  full code Last date of multidisciplinary goals of care discussion [she wishes somebody that can be moved along as well 9/4]  Critical care time:  60 min    Simonne Martinet ACNP-BC [facility] Pulmonary/Critical Care Pager # [phone] OR # [phone] if no answer   Patient seen, independently examined, care plan was updated and discussed with Zenia Resides as per documentation  Patient being treated for septic  shock, bacterial translocation from ischemic gut, status post exploratory laparotomy for small bowel obstruction, internal hernia, history of Crohn's disease with prior subtotal colectomy.  Acute hypoxemic respiratory failure secondary to cardiac arrest and aspiration.  She is septic with endorgan hypoperfusion  Middle-aged, chronically ill-appearing, sedated, on pressors Endotracheal tube in place Fair air entry bilaterally S1-S2 appreciated Postsurgical abdomen hypoactive bowel sounds Skin is warm and dry  I reviewed nursing notes, Consultant notes last 24 h vitals and pain scores, last 48 h intake and output, last 24 h labs and trends, and last 24 h imaging results.  Assessment/plan Septic shock -Remains on pressors -On  antibiotics-day 3 of Zosyn -Continue stress dose steroids -Continue to wean pressors  Small bowel obstruction secondary to internal hernia, resolved sent ischemic gut status post exploratory laparotomy -Plan is to be back to the OR 9/7 if hemodynamically stable  Hypoxemic respiratory failure following cardiac arrest Aspiration pneumonia -Continue full ventilator support -VAP bundle in place -Continue antibiotics  Acute kidney injury -Maintain renal perfusion -Avoid nephrotoxic medications -Trend electrolytes -Replete electrolytes as indicated  Hyperglycemia -Continue SSI  History of breast cancer -Not currently on any treatment  Prognosis is guarded to poor with multiorgan dysfunction  The patient is critically ill with multiple organ systems failure and requires high complexity decision making for assessment and support, frequent evaluation and titration of therapies, application of advanced monitoring technologies and extensive interpretation of multiple databases. Critical Care Time devoted to patient care services described in this note independent of APP/resident time (if applicable)  is 35 minutes.   Virl Diamond MD [place] [facility] Critical Care Personal pager: See Amion If unanswered, please page CCM On-call: #[phone]

## 2022-12-15 NOTE — Care Management (Signed)
Transition of Care [facility]) - Inpatient Brief Assessment   Patient Details  Name: [name] MRN: [id number] Date of Birth: 1975/05/14  Transition of Care [facility]) CM/SW Contact:    Lavenia Atlas, RN Phone Number: 12/15/2022, 2:47 PM   Clinical Narrative: Per chart review patient discharged on 8/15 from [facility]. Patient currently in [facility] ICU fully intubated. Palliative is following.  No current TOC needs.   TOC will continue to follow, if any needs arise please consult TOC.   Transition of Care Asessment: Insurance and Status: Insurance coverage has been reviewed Patient has primary care physician: Yes Home environment has been reviewed: resides w/ spousee/ significant other Prior level of function:: independent Prior/Current Home Services: No current home services Social Determinants of Health Reivew: SDOH reviewed no interventions necessary Readmission risk has been reviewed: Yes Transition of care needs: no transition of care needs at this time

## 2022-12-15 NOTE — Progress Notes (Signed)
Afternoon rounds  Interval data Lactate seems to be leveling off, was 4.2 earlier this morning, now down to 3.9 Troponin I's elevated trending up to 65 Echo with no wall motion abnormality and EF of 65% preliminary Sodium stable at 129 Serum bicarbonate slowly rising Serum creatinine slowly improving at its highest was 2.11 now down to 2.06 uNa 45 Co. oximetry 96% saturation Arterial blood gas pH 7.33 pCO2 29 pO2 137 bicarbonate 15 Has new CVL, awaiting central venous pressures Norepinephrine requirements at 8 mics per minute  Earlier I was concerned about her slow clearance of lactate.  As it had actually started to rise in the morning.  Lactate is now trending down.  Norepinephrine requirements about the same  Plan Continuing supportive care Check central venous pressure Continue to wean norepinephrine Follow-up arterial blood gas at 5 PM, I suspect we will need to decrease her minute ventilation as her metabolic status improves Follow-up a.m. lactate  My cct 20 min   Simonne Martinet ACNP-BC [facility] Pulmonary/Critical Care Pager # [phone] OR # [phone] if no answer

## 2022-12-16 ENCOUNTER — Inpatient Hospital Stay (HOSPITAL_COMMUNITY): Payer: Managed Care, Other (non HMO) | Admitting: Certified Registered"

## 2022-12-16 ENCOUNTER — Other Ambulatory Visit: Payer: Self-pay

## 2022-12-16 ENCOUNTER — Inpatient Hospital Stay (HOSPITAL_COMMUNITY): Payer: Managed Care, Other (non HMO)

## 2022-12-16 ENCOUNTER — Inpatient Hospital Stay (HOSPITAL_COMMUNITY): Payer: Self-pay | Admitting: Certified Registered"

## 2022-12-16 ENCOUNTER — Encounter (HOSPITAL_COMMUNITY)
Admission: EM | Disposition: A | Discharge status: Nursing Home (Includes ICF) | Payer: Self-pay | Source: Home / Self Care | Attending: Internal Medicine

## 2022-12-16 DIAGNOSIS — K56609 Unspecified intestinal obstruction, unspecified as to partial versus complete obstruction: Secondary | ICD-10-CM

## 2022-12-16 DIAGNOSIS — E876 Hypokalemia: Secondary | ICD-10-CM

## 2022-12-16 DIAGNOSIS — K559 Vascular disorder of intestine, unspecified: Secondary | ICD-10-CM

## 2022-12-16 HISTORY — PX: LAPAROTOMY: SHX154

## 2022-12-16 LAB — BLOOD GAS, ARTERIAL
Acid-base deficit: 5.1 mmol/L — ABNORMAL HIGH (ref 0.0–2.0)
Acid-base deficit: 5.4 mmol/L — ABNORMAL HIGH (ref 0.0–2.0)
Bicarbonate: 18.8 mmol/L — ABNORMAL LOW (ref 20.0–28.0)
Bicarbonate: 18.4 mmol/L — ABNORMAL LOW (ref 20.0–28.0)
Drawn by: 270711
FIO2: 30 %
FIO2: 30 %
MECHVT: 440 mL
MECHVT: 440 mL
O2 Saturation: 97.8 %
O2 Saturation: 97.8 %
PEEP: 5 cmH2O
PEEP: 5 cmH2O
Patient temperature: 36.7
Patient temperature: 36.4
RATE: 16 {breaths}/min
RATE: 16 {breaths}/min
pCO2 arterial: 31 mmHg — ABNORMAL LOW (ref 32–48)
pCO2 arterial: 28 mmHg — ABNORMAL LOW (ref 32–48)
pH, Arterial: 7.39 (ref 7.35–7.45)
pH, Arterial: 7.42 (ref 7.35–7.45)
pO2, Arterial: 180 mmHg — ABNORMAL HIGH (ref 83–108)
pO2, Arterial: 139 mmHg — ABNORMAL HIGH (ref 83–108)

## 2022-12-16 LAB — CBC
HCT: 27.2 % — ABNORMAL LOW (ref 36.0–46.0)
Hemoglobin: 9.4 g/dL — ABNORMAL LOW (ref 12.0–15.0)
MCH: 27.9 pg (ref 26.0–34.0)
MCHC: 34.6 g/dL (ref 30.0–36.0)
MCV: 80.7 fL (ref 80.0–100.0)
Platelets: 87 10*3/uL — ABNORMAL LOW (ref 150–400)
RBC: 3.37 MIL/uL — ABNORMAL LOW (ref 3.87–5.11)
RDW: 19.3 % — ABNORMAL HIGH (ref 11.5–15.5)
WBC: 29.3 10*3/uL — ABNORMAL HIGH (ref 4.0–10.5)
nRBC: 0 % (ref 0.0–0.2)

## 2022-12-16 LAB — COMPREHENSIVE METABOLIC PANEL
ALT: 62 U/L — ABNORMAL HIGH (ref 0–44)
AST: 105 U/L — ABNORMAL HIGH (ref 15–41)
Albumin: <1.5 g/dL — ABNORMAL LOW (ref 3.5–5.0)
Alkaline Phosphatase: 87 U/L (ref 38–126)
Anion gap: 9 (ref 5–15)
BUN: 28 mg/dL — ABNORMAL HIGH (ref 6–20)
CO2: 18 mmol/L — ABNORMAL LOW (ref 22–32)
Calcium: 7.2 mg/dL — ABNORMAL LOW (ref 8.9–10.3)
Chloride: 106 mmol/L (ref 98–111)
Creatinine, Ser: 2.05 mg/dL — ABNORMAL HIGH (ref 0.44–1.00)
GFR, Estimated: 30 mL/min — ABNORMAL LOW (ref >60)
Glucose, Bld: 145 mg/dL — ABNORMAL HIGH (ref 70–99)
Potassium: 3.6 mmol/L (ref 3.5–5.1)
Sodium: 133 mmol/L — ABNORMAL LOW (ref 135–145)
Total Bilirubin: 1.8 mg/dL — ABNORMAL HIGH (ref 0.3–1.2)
Total Protein: 3.8 g/dL — ABNORMAL LOW (ref 6.5–8.1)

## 2022-12-16 LAB — LACTIC ACID, PLASMA: Lactic Acid, Venous: 2.1 mmol/L (ref 0.5–1.9)

## 2022-12-16 LAB — GLUCOSE, CAPILLARY
Glucose-Capillary: 120 mg/dL — ABNORMAL HIGH (ref 70–99)
Glucose-Capillary: 132 mg/dL — ABNORMAL HIGH (ref 70–99)
Glucose-Capillary: 143 mg/dL — ABNORMAL HIGH (ref 70–99)
Glucose-Capillary: 145 mg/dL — ABNORMAL HIGH (ref 70–99)
Glucose-Capillary: 152 mg/dL — ABNORMAL HIGH (ref 70–99)

## 2022-12-16 LAB — URIC ACID, RANDOM URINE: Uric Acid, Urine: 56.8 mg/dL

## 2022-12-16 SURGERY — LAPAROTOMY, EXPLORATORY
Anesthesia: General | Site: Abdomen

## 2022-12-16 MED ORDER — SODIUM CHLORIDE 0.9 % IR SOLN
Status: DC | PRN
  Administered 2022-12-16: 2000 mL

## 2022-12-16 MED ORDER — FENTANYL CITRATE (PF) 100 MCG/2ML IJ SOLN
INTRAMUSCULAR | Status: AC
  Filled 2022-12-16: qty 2

## 2022-12-16 MED ORDER — PROPOFOL 10 MG/ML IV BOLUS
INTRAVENOUS | Status: AC
  Filled 2022-12-16: qty 20

## 2022-12-16 MED ORDER — ROCURONIUM BROMIDE 10 MG/ML (PF) SYRINGE
PREFILLED_SYRINGE | INTRAVENOUS | Status: DC | PRN
  Administered 2022-12-16: 10 mg via INTRAVENOUS
  Administered 2022-12-16: 40 mg via INTRAVENOUS
  Administered 2022-12-16: 30 mg via INTRAVENOUS

## 2022-12-16 MED ORDER — LACTATED RINGERS IV SOLN: INTRAVENOUS | Status: DC | PRN

## 2022-12-16 SURGICAL SUPPLY — 37 items
APL PRP STRL LF DISP 70% ISPRP (MISCELLANEOUS) ×1
APL SWBSTK 6 STRL LF DISP (MISCELLANEOUS) ×1
APPLICATOR COTTON TIP 6 STRL (MISCELLANEOUS) ×1 IMPLANT
APPLICATOR COTTON TIP 6IN STRL (MISCELLANEOUS) ×1
BAG COUNTER SPONGE SURGICOUNT (BAG) IMPLANT
BAG SPNG CNTER NS LX DISP (BAG)
BLADE HEX COATED 2.75 (ELECTRODE) ×1 IMPLANT
CHLORAPREP W/TINT 26 (MISCELLANEOUS) ×1 IMPLANT
COVER MAYO STAND STRL (DRAPES) IMPLANT
COVER SURGICAL LIGHT HANDLE (MISCELLANEOUS) ×1 IMPLANT
DRAPE LAPAROSCOPIC ABDOMINAL (DRAPES) ×1 IMPLANT
DRAPE WARM FLUID 44X44 (DRAPES) IMPLANT
ELECT REM PT RETURN 15FT ADLT (MISCELLANEOUS) ×1 IMPLANT
GAUZE PAD ABD 8X10 STRL (GAUZE/BANDAGES/DRESSINGS) IMPLANT
GAUZE SPONGE 4X4 12PLY STRL (GAUZE/BANDAGES/DRESSINGS) ×1 IMPLANT
GLOVE SURG ORTHO 8.0 STRL STRW (GLOVE) ×1 IMPLANT
GOWN STRL REUS W/ TWL XL LVL3 (GOWN DISPOSABLE) ×2 IMPLANT
GOWN STRL REUS W/TWL XL LVL3 (GOWN DISPOSABLE) ×2
HANDLE SUCTION POOLE (INSTRUMENTS) IMPLANT
KIT BASIN OR (CUSTOM PROCEDURE TRAY) ×1 IMPLANT
KIT TURNOVER KIT A (KITS) IMPLANT
NS IRRIG 1000ML POUR BTL (IV SOLUTION) ×1 IMPLANT
PACK GENERAL/GYN (CUSTOM PROCEDURE TRAY) ×1 IMPLANT
STAPLER VISISTAT 35W (STAPLE) ×1 IMPLANT
SUCTION POOLE HANDLE (INSTRUMENTS)
SUT NOV 1 T60/GS (SUTURE) IMPLANT
SUT PDS AB 1 TP1 54 (SUTURE) IMPLANT
SUT SILK 2 0 (SUTURE)
SUT SILK 2 0 SH CR/8 (SUTURE) IMPLANT
SUT SILK 2-0 18XBRD TIE 12 (SUTURE) IMPLANT
SUT SILK 3 0 (SUTURE)
SUT SILK 3 0 SH CR/8 (SUTURE) IMPLANT
SUT SILK 3-0 18XBRD TIE 12 (SUTURE) IMPLANT
TOWEL OR 17X26 10 PK STRL BLUE (TOWEL DISPOSABLE) ×2 IMPLANT
TRAY FOLEY MTR SLVR 16FR STAT (SET/KITS/TRAYS/PACK) IMPLANT
WATER STERILE IRR 1000ML POUR (IV SOLUTION) ×1 IMPLANT
YANKAUER SUCT BULB TIP NO VENT (SUCTIONS) IMPLANT

## 2022-12-16 NOTE — Transfer of Care (Signed)
Immediate Anesthesia Transfer of Care Note  Patient: [name]  Procedure(s) Performed: REOPENING LAPAROTOMY, ILEOSTOMY;  ABDOMINAL CLOSURE (Abdomen)  Patient Location: ICU  Anesthesia Type:General  Level of Consciousness: Patient remains intubated per anesthesia plan  Airway & Oxygen Therapy: Patient remains intubated per anesthesia plan and Patient placed on Ventilator (see vital sign flow sheet for setting)  Post-op Assessment: Report given to RN and Post -op Vital signs reviewed and stable  Post vital signs: Reviewed and stable  Last Vitals:  Vitals Value Taken Time  BP    Temp    Pulse    Resp    SpO2      Last Pain:  Vitals:   12/16/22 0350  TempSrc: Axillary  PainSc:          Complications: No notable events documented.

## 2022-12-16 NOTE — Anesthesia Postprocedure Evaluation (Signed)
Anesthesia Post Note  Patient: [name]  Procedure(s) Performed: REOPENING LAPAROTOMY, ILEOSTOMY;  ABDOMINAL CLOSURE (Abdomen)     Patient location during evaluation: ICU Anesthesia Type: General Level of consciousness: patient remains intubated per anesthesia plan Pain management: pain level controlled Vital Signs Assessment: post-procedure vital signs reviewed and stable Respiratory status: respiratory function stable Cardiovascular status: blood pressure returned to baseline and stable Postop Assessment: no apparent nausea or vomiting Anesthetic complications: no   No notable events documented.  Last Vitals:  Vitals:   12/16/22 0959 12/16/22 1114  BP:    Pulse: 62   Resp: 16   Temp:    SpO2: 100% 99%    Last Pain:  Vitals:   12/16/22 0350  TempSrc: Axillary  PainSc:                  Lowella Curb

## 2022-12-16 NOTE — Progress Notes (Signed)
Progress Note  Day of Surgery  Subjective: Remains intubated and sedated.  Pressor requirements coming down.   Objective: Vital signs in last 24 hours: Temp:  [96.4 F (35.8 C)-98.6 F (37 C)] 98.1 F (36.7 C) (09/07 0350) Pulse Rate:  [75-88] 76 (09/07 0330) Resp:  [15-19] 16 (09/07 0330) BP: (122-123)/(84) 123/84 (09/06 1200) SpO2:  [99 %-100 %] 100 % (09/07 0333) Arterial Line BP: (85-147)/(42-89) 108/73 (09/07 0330) FiO2 (%):  [30 %] 30 % (09/07 0400) Weight:  [62.3 kg] 62.3 kg (09/07 0500) Last BM Date :  (PTA)  Intake/Output from previous day: 09/06 0701 - 09/07 0700 In: 4024.2 [I.V.:3131.1; IV Piggyback:893] Out: 1600 [Urine:1225; Drains:375] Intake/Output this shift: No intake/output data recorded.  PE: General: WD female who is intubated and critically ill Heart: regular, rate, and rhythm.  Normal s1,s2. No obvious murmurs, gallops, or rubs noted.  Palpable pedal pulses bilaterally Lungs: vent sounds bilaterally Abd: Abthera in place with SS output GU: foley present with minimal urine that appears somewhat concentrated MS: sedated   Lab Results:  Recent Labs    12/15/22 0505 12/16/22 0345  WBC 25.1* 29.3*  HGB 10.7* 9.4*  HCT 32.0* 27.2*  PLT 126* 87*   BMET Recent Labs    12/15/22 1340 12/16/22 0345  NA 129* 133*  K 3.7 3.6  CL 101 106  CO2 17* 18*  GLUCOSE 162* 145*  BUN 31* 28*  CREATININE 2.06* 2.05*  CALCIUM 7.4* 7.2*   PT/INR Recent Labs    12/13/22 1038 12/14/22 0445  LABPROT 17.4* 16.6*  INR 1.4* 1.3*   CMP     Component Value Date/Time   NA 133 (L) 12/16/2022 0345   NA 135 01/21/2020 1056   K 3.6 12/16/2022 0345   CL 106 12/16/2022 0345   CO2 18 (L) 12/16/2022 0345   GLUCOSE 145 (H) 12/16/2022 0345   BUN 28 (H) 12/16/2022 0345   BUN 12 01/21/2020 1056   CREATININE 2.05 (H) 12/16/2022 0345   CREATININE 0.91 09/21/2022 1141   CALCIUM 7.2 (L) 12/16/2022 0345   PROT 3.8 (L) 12/16/2022 0345   ALBUMIN <1.5 (L)  12/16/2022 0345   AST 105 (H) 12/16/2022 0345   AST 18 09/21/2022 1141   ALT 62 (H) 12/16/2022 0345   ALT 12 09/21/2022 1141   ALKPHOS 87 12/16/2022 0345   BILITOT 1.8 (H) 12/16/2022 0345   BILITOT 0.4 09/21/2022 1141   GFRNONAA 30 (L) 12/16/2022 0345   GFRNONAA >60 09/21/2022 1141   GFRAA 108 01/21/2020 1056   Lipase     Component Value Date/Time   LIPASE 19 12/12/2022 2130       Studies/Results: DG CHEST PORT 1 VIEW  Result Date: 12/15/2022 CLINICAL DATA:  Central line placement. EXAM: PORTABLE CHEST 1 VIEW COMPARISON:  Same day. FINDINGS: Interval placement of right subclavian catheter with distal tip in expected position of the SVC. No pneumothorax is noted. Stable bilateral lung opacities as noted on prior exam. IMPRESSION: Interval placement of right subclavian catheter with distal tip in expected position of the SVC. Electronically Signed   By: Lupita Raider M.D.   On: 12/15/2022 15:57   ECHOCARDIOGRAM COMPLETE  Result Date: 12/15/2022    ECHOCARDIOGRAM REPORT   Patient Name:   [name] Date of Exam: 12/15/2022 Medical Rec #:  [id number]        Height:       64.0 in Accession #:    [id number]  Weight:       131.8 lb Date of Birth:  Dec 13, 1975        BSA:          1.639 m Patient Age:    47 years         BP:           123/84 mmHg Patient Gender: F                HR:           85 bpm. Exam Location:  Inpatient Procedure: 2D Echo, Color Doppler and Cardiac Doppler Indications:    Shock  History:        Patient has prior history of Echocardiogram examinations, most                 recent 12/13/2022. Breast CA, family hx breast CA, Crohn's                 disease.  Sonographer:    [name] Referring Phys: [id number] PETER E BABCOCK  Sonographer Comments: Suboptimal apical window and echo performed with patient supine and on artificial respirator. Image acquisition challenging due to patient body habitus, Image acquisition challenging due to respiratory motion and Image  acquisition challenging due to breast implants. IMPRESSIONS  1. Left ventricular ejection fraction, by estimation, is 60 to 65%. The left ventricle has normal function. The left ventricle has no regional wall motion abnormalities. Left ventricular diastolic parameters are indeterminate. There is incoordiante septal motion.  2. Right ventricular systolic function is low normal. The right ventricular size is normal. There is normal pulmonary artery systolic pressure. The estimated right ventricular systolic pressure is 23.7 mmHg.  3. The mitral valve is normal in structure. No evidence of mitral valve regurgitation. No evidence of mitral stenosis.  4. The tricuspid valve is abnormal. Tricuspid valve regurgitation is mild to moderate.  5. The aortic valve is tricuspid. Aortic valve regurgitation is not visualized. No aortic stenosis is present.  6. The inferior vena cava is normal in size with <50% respiratory variability, suggesting right atrial pressure of 8 mmHg. Comparison(s): Changes from prior study are noted. 12/13/2022: LVEF 50-55%, moderate RV enlargment and mild RV systolic dysfunction. FINDINGS  Left Ventricle: Left ventricular ejection fraction, by estimation, is 60 to 65%. The left ventricle has normal function. The left ventricle has no regional wall motion abnormalities. The left ventricular internal cavity size was normal in size. There is  no left ventricular hypertrophy. Incoordiante septal motion. Left ventricular diastolic parameters are indeterminate. Right Ventricle: The right ventricular size is normal. Right vetricular wall thickness was not well visualized. Right ventricular systolic function is low normal. There is normal pulmonary artery systolic pressure. The tricuspid regurgitant velocity is 1.98 m/s, and with an assumed right atrial pressure of 8 mmHg, the estimated right ventricular systolic pressure is 23.7 mmHg. Left Atrium: Left atrial size was normal in size. Right Atrium: Right atrial  size was normal in size. Pericardium: There is no evidence of pericardial effusion. Mitral Valve: The mitral valve is normal in structure. No evidence of mitral valve regurgitation. No evidence of mitral valve stenosis. Tricuspid Valve: The tricuspid valve is abnormal. Tricuspid valve regurgitation is mild to moderate. Aortic Valve: The aortic valve is tricuspid. Aortic valve regurgitation is not visualized. No aortic stenosis is present. Pulmonic Valve: The pulmonic valve was not well visualized. Pulmonic valve regurgitation is not visualized. Aorta: The aortic root and ascending aorta are structurally normal, with no  evidence of dilitation. Venous: The inferior vena cava is normal in size with less than 50% respiratory variability, suggesting right atrial pressure of 8 mmHg. IAS/Shunts: No atrial level shunt detected by color flow Doppler.  LEFT VENTRICLE PLAX 2D LVIDd:         3.70 cm     Diastology LVIDs:         3.20 cm     LV e' medial:  6.64 cm/s LV PW:         0.70 cm     LV e' lateral: 7.29 cm/s LV IVS:        0.70 cm LVOT diam:     1.80 cm LVOT Area:     2.54 cm  LV Volumes (MOD) LV vol d, MOD A4C: 77.4 ml LV vol s, MOD A4C: 28.4 ml LV SV MOD A4C:     77.4 ml RIGHT VENTRICLE             IVC RV S prime:     10.70 cm/s  IVC diam: 1.50 cm TAPSE (M-mode): 1.5 cm LEFT ATRIUM           Index       RIGHT ATRIUM          Index LA diam:      2.20 cm 1.34 cm/m  RA Area:     6.74 cm LA Vol (A4C): 9.2 ml  5.63 ml/m  RA Volume:   11.14 ml 6.80 ml/m   AORTA Ao Root diam: 2.40 cm Ao Asc diam:  2.60 cm TRICUSPID VALVE TR Peak grad:   15.7 mmHg TR Mean grad:   12.0 mmHg TR Vmax:        198.00 cm/s TR Vmean:       168.0 cm/s  SHUNTS Systemic Diam: 1.80 cm Zoila Shutter MD Electronically signed by Zoila Shutter MD Signature Date/Time: 12/15/2022/2:53:11 PM    Final    VAS [place] LOWER EXTREMITY VENOUS (DVT)  Result Date: 12/15/2022  Lower Venous DVT Study Patient Name:  [name]  Date of Exam:   12/14/2022 Medical  Rec #: [id number]         Accession #:    [id number] Date of Birth: Nov 30, 1975         Patient Gender: F Patient Age:   65 years Exam Location:  [facility] Procedure:      VAS [place] LOWER EXTREMITY VENOUS (DVT) Referring Phys: Levon Hedger --------------------------------------------------------------------------------  Indications: Edema.  Risk Factors: None identified. Limitations: Bandages and patient positioning. Comparison Study: No prior studies. Performing Technologist: Chanda Busing RVT  Examination Guidelines: A complete evaluation includes B-mode imaging, spectral Doppler, color Doppler, and power Doppler as needed of all accessible portions of each vessel. Bilateral testing is considered an integral part of a complete examination. Limited examinations for reoccurring indications may be performed as noted. The reflux portion of the exam is performed with the patient in reverse Trendelenburg.  +---------+---------------+---------+-----------+----------+-------------------+ RIGHT    CompressibilityPhasicitySpontaneityPropertiesThrombus Aging      +---------+---------------+---------+-----------+----------+-------------------+ CFV                                                   Not well visualized +---------+---------------+---------+-----------+----------+-------------------+ SFJ  Not well visualized +---------+---------------+---------+-----------+----------+-------------------+ FV Prox  Full           Yes      Yes                                      +---------+---------------+---------+-----------+----------+-------------------+ FV Mid   Full                                                             +---------+---------------+---------+-----------+----------+-------------------+ FV DistalFull                                                              +---------+---------------+---------+-----------+----------+-------------------+ PFV                                                   Not well visualized +---------+---------------+---------+-----------+----------+-------------------+ POP      Full           Yes      Yes                                      +---------+---------------+---------+-----------+----------+-------------------+ PTV      Full                                                             +---------+---------------+---------+-----------+----------+-------------------+ PERO     Full                                                             +---------+---------------+---------+-----------+----------+-------------------+   +---------+---------------+---------+-----------+----------+--------------+ LEFT     CompressibilityPhasicitySpontaneityPropertiesThrombus Aging +---------+---------------+---------+-----------+----------+--------------+ CFV      Full           Yes      Yes                                 +---------+---------------+---------+-----------+----------+--------------+ SFJ      Full                                                        +---------+---------------+---------+-----------+----------+--------------+ FV Prox  Full                                                        +---------+---------------+---------+-----------+----------+--------------+  FV Mid   Full                                                        +---------+---------------+---------+-----------+----------+--------------+ FV DistalFull                                                        +---------+---------------+---------+-----------+----------+--------------+ PFV      Full                                                        +---------+---------------+---------+-----------+----------+--------------+ POP      Full           Yes      Yes                                  +---------+---------------+---------+-----------+----------+--------------+ PTV      Full                                                        +---------+---------------+---------+-----------+----------+--------------+ PERO     Full                                                        +---------+---------------+---------+-----------+----------+--------------+     Summary: RIGHT: - There is no evidence of deep vein thrombosis in the lower extremity. However, portions of this examination were limited- see technologist comments above.  - No cystic structure found in the popliteal fossa.  LEFT: - There is no evidence of deep vein thrombosis in the lower extremity.  - No cystic structure found in the popliteal fossa.  *See table(s) above for measurements and observations. Electronically signed by Gerarda Fraction on 12/15/2022 at 8:40:38 AM.    Final    DG Chest Port 1 View  Result Date: 12/15/2022 CLINICAL DATA:  47 year old female with history of respiratory failure. EXAM: PORTABLE CHEST 1 VIEW COMPARISON:  Chest x-ray 12/14/2022. FINDINGS: An endotracheal tube is in place with tip 3.7 cm above the carina. Nasogastric tube extends into the proximal stomach with side port near the gastroesophageal junction. Left internal jugular single-lumen porta cath with tip terminating at the superior cavoatrial junction. Lung volumes are low. Patchy ill-defined opacities noted throughout the mid to lower lungs bilaterally (right greater than left). No definite pleural effusions. No pneumothorax. No evidence of pulmonary edema. Heart size is normal. Upper mediastinal contours are within normal limits. IMPRESSION: 1. Support apparatus, as above. Consider advancement of the nasogastric tube approximately 5-10 cm for more optimal placement within the stomach (side port is near the gastroesophageal junction). 2. Persistent ill-defined opacities  throughout the mid to lower lungs bilaterally, which may reflect areas of  atelectasis and/or consolidation. Electronically Signed   By: Trudie Reed M.D.   On: 12/15/2022 07:28    Anti-infectives: Anti-infectives (From admission, onward)    Start     Dose/Rate Route Frequency Ordered Stop   12/13/22 1000  piperacillin-tazobactam (ZOSYN) IVPB 3.375 g        3.375 g 12.5 mL/hr over 240 Minutes Intravenous Every 8 hours 12/13/22 1610          Assessment/Plan  Crohn's disease s/p subtotal colectomy now s/p ex-lap and SBR for internal hernia - Plan for OR today for possible ostomy and possible abdominal wall closure. Consent obtained - Remainder of care per ICU   LOS: 3 days   I reviewed Consultant GI notes, last 24 h vitals and pain scores, last 48 h intake and output, last 24 h labs and trends, last 24 h imaging results, and CCM notes .   Moise Boring, MD [facility] Surgery 12/16/2022, 7:33 AM Please see Amion for pager number during day hours 7:00am-4:30pm

## 2022-12-16 NOTE — Op Note (Addendum)
Post-Op Note/Post-Procedure Note  Patient: [name] MRN: [id number] DOB: 1975-05-25 Sex: female Operation/Procedure Date: 12/12/2022 - 12/16/2022  Surgeons and Role:    * Hillery Hunter, Lucilla Edin, MD - Primary    * Griselda Miner, MD - Assisting  Pre-operative Diagnoses: bowel ischemia Postoperative Diagnoses: bowel ischemia  Procedure performed: Re-opening laparotomy, ileostomy creation, abdominal wall closure  Anesthesia: General endotracheal anesthesia  Indications: [name] is a 47 y.o. year old female who is critically ill in the ICU. She underwent an exploratory laparotomy 2 days ago and was found to have significant small bowel ischemia requiring resection. She was left in discontinuity with a temporary abdominal wall closure device.  She has remained stable in the ICU and following discussion with family the decision was made to return to the OR for ostomy creation and abdominal wall closure.  Operative Findings: Remaining small bowel appeared healthy.  No signs of ongoing abdominal contamination.  End ileostomy matured.    Technique: The patient was positively identified and was taken to the operating room. The patient was then placed in the supine position on the operating table. We confirmed administration of deep venous thrombosis prophylaxis and continuation of the patient's scheduled antibiotics. After successful induction of general endotracheal anesthesia, the arms were carefully padded. The abdomen was prepped and draped in the usual sterile surgical fashion. A time-out was performed confirming patient and procedure.  We began by removing the Abthera dressing. There was minimal ascites and no sign of ongoing contamination. The small bowel was inspected and appeared healthy without additional areas of necrosis.  The abdomen was irrigated with warm saline. The effluent was clear.  An area within the left rectus muscle was selected for an end ileostomy.  A elliptical  incision was made and carried down through the skin and subcutaneous tissue using electrocautery.  The anterior fascia was incised in a cruciate fashion.  The muscle was spread and the the posterior fascia and peritoneum incised and lengthened to allow space for the small bowel to pass through.  The small bowel passed through the opening without tension.  After confirming the correct orientation of the mesentery passing through the trephination, the midline fascia was closed using running #1 non-looped PDS.  The wound was irrigated and the skin left open.  Next, we turned out attention to maturing the end ileostomy.  The staple line was excised and healthy appearing mucosa was found. The ostomy was matured in standard brooked fashion using 3-0 vicryl.   All counts were reported as correct.  The patient remained intubated and will be transported back to the ICU by anesthesia.    Estimated Blood Loss: Minimal Specimens: None Implants: * No implants in log * Drains: None Complications: None

## 2022-12-16 NOTE — Anesthesia Preprocedure Evaluation (Signed)
Anesthesia Evaluation  Patient identified by MRN, date of birth, ID band Patient unresponsive    Reviewed: Allergy & Precautions, Patient's Chart, lab work & pertinent test results, Unable to perform ROS - Chart review only  Airway Mallampati: Intubated       Dental   Pulmonary former smoker Intubated on mechanical ventilation   breath sounds clear to auscultation+ rhonchi        Cardiovascular Normal cardiovascular exam Rhythm:Regular Rate:Tachycardia  S/P Cardiac arrest yesterday- currently on Levophed and Vasopressin drips  Echo  12/13/22 1. Windows are challenging, very limited study. Left ventricular ejection  fraction, by estimation, is 50 to 55%. The left ventricle has low normal  function.   2. Right ventricular systolic function is mildly reduced. The right  ventricular size is moderately enlarged.   3. The mitral valve is normal in structure. Trivial mitral valve  regurgitation.   4. Aortic valve regurgitation is not visualized.   5. The inferior vena cava IVC not well visualized.   Conclusion(s)/Recommendation(s): RV appears more dilated than prior study  , recommend assessment for PE.   FINDINGS   Left Ventricle: Windows are challenging, very limited study. Left  ventricular ejection fraction, by estimation, is 50 to 55%. The left  ventricle has low normal function. There is no left ventricular  hypertrophy.   Right Ventricle: The right ventricular size is moderately enlarged. Right  ventricular systolic function is mildly reduced.     Neuro/Psych negative neurological ROS  negative psych ROS   GI/Hepatic Neg liver ROS,,,Elevated LFT's Crohn's disease- intestinal stricture, steroid dependent S/P colon resection   Endo/Other  negative endocrine ROS    Renal/GU ARFRenal diseaseHyponatremia  negative genitourinary   Musculoskeletal negative musculoskeletal ROS (+)    Abdominal   Peds   Hematology  (+) Blood dyscrasia, anemia   Anesthesia Other Findings   Reproductive/Obstetrics                             Anesthesia Physical Anesthesia Plan  ASA: 4  Anesthesia Plan: General   Post-op Pain Management:    Induction: Intravenous  PONV Risk Score and Plan: 3 and Treatment may vary due to age or medical condition, Ondansetron, Dexamethasone and Midazolam  Airway Management Planned: Oral ETT  Additional Equipment: Arterial line  Intra-op Plan:   Post-operative Plan: Post-operative intubation/ventilation  Informed Consent: I have reviewed the patients History and Physical, chart, labs and discussed the procedure including the risks, benefits and alternatives for the proposed anesthesia with the patient or authorized representative who has indicated his/her understanding and acceptance.     Dental advisory given  Plan Discussed with: CRNA and Anesthesiologist  Anesthesia Plan Comments:         Anesthesia Quick Evaluation

## 2022-12-16 NOTE — Progress Notes (Signed)
NAME:  [name], MRN:  [id number], DOB:  Mar 23, 1976, LOS: 3 ADMISSION DATE:  12/12/2022, CONSULTATION DATE:  12/13/22 REFERRING MD:  Rubin Payor, MD CHIEF COMPLAINT:  cardiac arrest    History of Present Illness:  47 year old female with past medical history of recurrent breast cancer not currently on treatment, Crohn's disease s/p subtotal colectomy in 2021 recurrent obstructions, colonic stricture, IDA, recent hospitalization 8/10-8/15/2024 for obstruction seen by GI and general surgery. Underwent EGD and flexible sigmoidoscopy and discharged on steroids who presented to the emergency department on 12/12/2022 with abdominal pain, nausea, vomiting diarrhea. In the ED tachycardic, hypertensive. ED provider notes abdominal distention. She was given 2L IVF, morphine, IV PPI. Labs with WBC 10.8, Hgb 10.7, K 3.2. CT AP was ordered and showed markedly dilated distal small bowel consistent with chronic obstruction. No changes since prior study. Patient was admitted to [facility]. They consulted GI for input. Unfortunately, around 0820 this morning patient was found on the floor in the bathroom in ED pulseless. They estimate downtime around 5 minutes. ACLS was initiated. Concern for possible torsades de pointes and given IV mag, defibrillated. Coded for around 5 minutes. She was intubated. She had copious vomitus in the ETT. ROSC achieved and ICU was consulted. Notably, she had I-stat peri-code which showed hemoglobin of 5.5 down from 10 overnight.   Pertinent  Medical History  Crohn's disease with previous bowel obstruction s/p subtotal colectomy 2021, colonic stricture, IDA, breast cancer s/p mastectomy 2022  Significant Hospital Events: Including procedures, antibiotic start and stop dates in addition to other pertinent events   9/3: admitted to [facility] for abdominal distention, presumed Crohn's exacerbation 9/4: ~0820 cardiac arrest in ED. Admitted to ICU intubated, sedated. Hgb 5.5. Placed right femoral CVC.  Received 3 UPRBC, 3 FFP. 9/5: - Hgb stable at 11.7 after 3u PRBC, 3 FPP 9/5 overnight increasing pressor requirements - added vaso. Bladder pressures 28, went for exploratory lap w/ sb resxn, and celiotomy, intra op findings: internal hernia w/ associated small bowel ischemia. Also seen by GI and palliative. Full scope of care desired  9/6 some ST changes in lateral leads. Weaning pressors. Removing femoral line and placing internal jugular. Renal fxn worse, on-going metabolic acidosis.  Interim History / Subjective:   Sedated, on vent, stable, tolerated exploratory laparotomy this morning  Objective   Blood pressure 123/84, pulse 62, temperature 98.1 F (36.7 C), temperature source Axillary, resp. rate 16, height 5\' 4"  (1.626 m), weight 62.3 kg, last menstrual period 04/24/2021, SpO2 99%. CVP:  [0 mmHg-11 mmHg] 9 mmHg  Vent Mode: PRVC FiO2 (%):  [30 %] 30 % Set Rate:  [16 bmp] 16 bmp Vt Set:  [440 mL] 440 mL PEEP:  [5 cmH20] 5 cmH20 Plateau Pressure:  [18 cmH20-20 cmH20] 19 cmH20   Intake/Output Summary (Last 24 hours) at 12/16/2022 1141 Last data filed at 12/16/2022 1018 Gross per 24 hour  Intake 4134.91 ml  Output 1545 ml  Net 2589.91 ml   Filed Weights   12/14/22 0500 12/15/22 0500 12/16/22 0500  Weight: 56.8 kg 59.8 kg 62.3 kg    Examination: 47 year old, currently sedated on vent HENT: Endotracheal tube in place, moist oral mucosa Pulm: Clear breath sounds, decreased air entry at the bases Card: S1-S2 appreciated Abd: Postsurgical Ext cool to touch. Pulses weak. No sig edema  Neuro sedated on vent   Resolved Hospital Problem list   Cardiac arrest   Assessment & Plan:   Septic shock Likely secondary to bacterial translocation from ischemic  gut -Pressor needs stable -Day 4 of Zosyn -Continue stress dose steroids -Continue to wean pressors  ST elevation in the lateral leads -Remains stable -No new changes on EKG  Small bowel obstruction secondary to internal  hernia and resultant ischemic gut status post exploratory laparotomy -Tolerated surgery well today -Entyvio on hold  Acute hypoxemic respiratory failure secondary cardiac arrest aspiration -Continue mechanical ventilation per ARDS protocol -Target TVol 6-8cc/kgIBW -Target Plateau Pressure < 30cm H20 -Target driving pressure less than 15 cm of water -Target PaO2 55-65: titrate PEEP/FiO2 per protocol -Ventilator associated pneumonia prevention protocol  AKI secondary to sepsis and endorgan hypoperfusion -Maintain renal perfusion -Trend electrolytes -Avoid nephrotoxic medications  Acute on chronic anemia -Continue to monitor -Transfuse per protocol  Thrombocytopenia -Likely related to acute illness -Continue to trend  Hyperglycemia -Continue SSI  Shock liver -Continue to trend -LFTs improving  Recurrent breast cancer not currently on treatment  - per Dr. Pamelia Hoit note 10/11/22, patient has declined further treatment at this time.  Plan to have follow-up in September to discuss anti-estrogen therapy.   Best Practice (right click and "Reselect all SmartList Selections" daily)   Diet/type: NPO DVT prophylaxis: SCD - anemic - receiving PRBC GI prophylaxis: PPI Lines: Central line and yes and it is still needed Foley:  Yes, and it is still needed Code Status:  full code Last date of multidisciplinary goals of care discussion [family members at b bedside, had no questions]  The patient is critically ill with multiple organ systems failure and requires high complexity decision making for assessment and support, frequent evaluation and titration of therapies, application of advanced monitoring technologies and extensive interpretation of multiple databases. Critical Care Time devoted to patient care services described in this note independent of APP/resident time (if applicable)  is 40 minutes.   Virl Diamond MD  Pulmonary Critical Care Personal pager: See Amion If  unanswered, please page CCM On-call: #[phone]

## 2022-12-16 NOTE — Progress Notes (Signed)
PT placed back on vent after going to the OR this AM.  Same vent settings. No issues noted.

## 2022-12-17 DIAGNOSIS — E440 Moderate protein-calorie malnutrition: Secondary | ICD-10-CM

## 2022-12-17 LAB — BLOOD GAS, ARTERIAL
Acid-base deficit: 2.7 mmol/L — ABNORMAL HIGH (ref 0.0–2.0)
Acid-base deficit: 3.8 mmol/L — ABNORMAL HIGH (ref 0.0–2.0)
Bicarbonate: 20.6 mmol/L (ref 20.0–28.0)
Bicarbonate: 19 mmol/L — ABNORMAL LOW (ref 20.0–28.0)
Drawn by: 20012
FIO2: 30 %
FIO2: 28 %
MECHVT: 440 mL
MECHVT: 440 mL
O2 Saturation: 96.3 %
O2 Saturation: 98 %
PEEP: 5 cmH2O
PEEP: 5 cmH2O
Patient temperature: 36.7
Patient temperature: 36.1
RATE: 16 {breaths}/min
RATE: 16 {breaths}/min
pCO2 arterial: 31 mmHg — ABNORMAL LOW (ref 32–48)
pCO2 arterial: 27 mmHg — ABNORMAL LOW (ref 32–48)
pH, Arterial: 7.43 (ref 7.35–7.45)
pH, Arterial: 7.45 (ref 7.35–7.45)
pO2, Arterial: 138 mmHg — ABNORMAL HIGH (ref 83–108)
pO2, Arterial: 140 mmHg — ABNORMAL HIGH (ref 83–108)

## 2022-12-17 LAB — COMPREHENSIVE METABOLIC PANEL
ALT: 62 U/L — ABNORMAL HIGH (ref 0–44)
ALT: 55 U/L — ABNORMAL HIGH (ref 0–44)
AST: 82 U/L — ABNORMAL HIGH (ref 15–41)
AST: 63 U/L — ABNORMAL HIGH (ref 15–41)
Albumin: <1.5 g/dL — ABNORMAL LOW (ref 3.5–5.0)
Albumin: <1.5 g/dL — ABNORMAL LOW (ref 3.5–5.0)
Alkaline Phosphatase: 116 U/L (ref 38–126)
Alkaline Phosphatase: 99 U/L (ref 38–126)
Anion gap: 9 (ref 5–15)
Anion gap: 6 (ref 5–15)
BUN: 28 mg/dL — ABNORMAL HIGH (ref 6–20)
BUN: 27 mg/dL — ABNORMAL HIGH (ref 6–20)
CO2: 20 mmol/L — ABNORMAL LOW (ref 22–32)
CO2: 21 mmol/L — ABNORMAL LOW (ref 22–32)
Calcium: 7.3 mg/dL — ABNORMAL LOW (ref 8.9–10.3)
Calcium: 7.2 mg/dL — ABNORMAL LOW (ref 8.9–10.3)
Chloride: 108 mmol/L (ref 98–111)
Chloride: 109 mmol/L (ref 98–111)
Creatinine, Ser: 1.94 mg/dL — ABNORMAL HIGH (ref 0.44–1.00)
Creatinine, Ser: 1.86 mg/dL — ABNORMAL HIGH (ref 0.44–1.00)
GFR, Estimated: 32 mL/min — ABNORMAL LOW (ref >60)
GFR, Estimated: 34 mL/min — ABNORMAL LOW (ref >60)
Glucose, Bld: 147 mg/dL — ABNORMAL HIGH (ref 70–99)
Glucose, Bld: 147 mg/dL — ABNORMAL HIGH (ref 70–99)
Potassium: 3.4 mmol/L — ABNORMAL LOW (ref 3.5–5.1)
Potassium: 3.7 mmol/L (ref 3.5–5.1)
Sodium: 137 mmol/L (ref 135–145)
Sodium: 136 mmol/L (ref 135–145)
Total Bilirubin: 1.7 mg/dL — ABNORMAL HIGH (ref 0.3–1.2)
Total Bilirubin: 1.5 mg/dL — ABNORMAL HIGH (ref 0.3–1.2)
Total Protein: 3.7 g/dL — ABNORMAL LOW (ref 6.5–8.1)
Total Protein: 3.5 g/dL — ABNORMAL LOW (ref 6.5–8.1)

## 2022-12-17 LAB — MAGNESIUM: Magnesium: 1.8 mg/dL (ref 1.7–2.4)

## 2022-12-17 LAB — TYPE AND SCREEN
ABO/RH(D): B POS
Antibody Screen: NEGATIVE
Unit division: 0
Unit division: 0
Unit division: 0
Unit division: 0

## 2022-12-17 LAB — BPAM RBC
Blood Product Expiration Date: 202409102359
Blood Product Expiration Date: 202409122359
Blood Product Expiration Date: 202410022359
Blood Product Expiration Date: 202410022359
ISSUE DATE / TIME: 202409040934
ISSUE DATE / TIME: 202409040934
ISSUE DATE / TIME: 202409041221
Unit Type and Rh: 7300
Unit Type and Rh: 7300
Unit Type and Rh: 9500
Unit Type and Rh: 9500

## 2022-12-17 LAB — CBC
HCT: 25.8 % — ABNORMAL LOW (ref 36.0–46.0)
Hemoglobin: 8.9 g/dL — ABNORMAL LOW (ref 12.0–15.0)
MCH: 28.2 pg (ref 26.0–34.0)
MCHC: 34.5 g/dL (ref 30.0–36.0)
MCV: 81.6 fL (ref 80.0–100.0)
Platelets: 56 10*3/uL — ABNORMAL LOW (ref 150–400)
RBC: 3.16 MIL/uL — ABNORMAL LOW (ref 3.87–5.11)
RDW: 19.3 % — ABNORMAL HIGH (ref 11.5–15.5)
WBC: 28.2 10*3/uL — ABNORMAL HIGH (ref 4.0–10.5)
nRBC: 0 % (ref 0.0–0.2)

## 2022-12-17 LAB — GLUCOSE, CAPILLARY
Glucose-Capillary: 126 mg/dL — ABNORMAL HIGH (ref 70–99)
Glucose-Capillary: 133 mg/dL — ABNORMAL HIGH (ref 70–99)
Glucose-Capillary: 115 mg/dL — ABNORMAL HIGH (ref 70–99)
Glucose-Capillary: 125 mg/dL — ABNORMAL HIGH (ref 70–99)
Glucose-Capillary: 150 mg/dL — ABNORMAL HIGH (ref 70–99)
Glucose-Capillary: 149 mg/dL — ABNORMAL HIGH (ref 70–99)

## 2022-12-17 LAB — PHOSPHORUS: Phosphorus: 2.6 mg/dL (ref 2.5–4.6)

## 2022-12-17 MED ORDER — TRAVASOL 10 % IV SOLN
INTRAVENOUS | Status: AC
  Filled 2022-12-17: qty 417.6

## 2022-12-17 MED ORDER — DEXTROSE IN LACTATED RINGERS 5 % IV SOLN: INTRAVENOUS | Status: AC

## 2022-12-17 MED ORDER — MAGNESIUM SULFATE IN D5W 1-5 GM/100ML-% IV SOLN
1.0000 g | Freq: Once | INTRAVENOUS | Status: AC
  Administered 2022-12-17: 1 g via INTRAVENOUS
  Filled 2022-12-17: qty 100

## 2022-12-17 MED ORDER — HYDROCORTISONE SOD SUC (PF) 100 MG IJ SOLR
50.0000 mg | Freq: Two times a day (BID) | INTRAMUSCULAR | Status: DC
  Administered 2022-12-17 – 2022-12-18 (×3): 50 mg via INTRAVENOUS
  Filled 2022-12-17 (×3): qty 2

## 2022-12-17 MED ORDER — POTASSIUM CHLORIDE 10 MEQ/50ML IV SOLN
10.0000 meq | INTRAVENOUS | Status: AC
  Administered 2022-12-17 (×4): 10 meq via INTRAVENOUS
  Filled 2022-12-17 (×4): qty 50

## 2022-12-17 MED ORDER — THIAMINE HCL 100 MG/ML IJ SOLN
100.0000 mg | Freq: Every day | INTRAMUSCULAR | Status: AC
  Administered 2022-12-17 – 2022-12-21 (×5): 100 mg via INTRAVENOUS
  Filled 2022-12-17 (×5): qty 2

## 2022-12-17 NOTE — Progress Notes (Signed)
NAME:  [name], MRN:  [id number], DOB:  05-14-1975, LOS: 4 ADMISSION DATE:  12/12/2022, CONSULTATION DATE:  12/13/22 REFERRING MD:  Rubin Payor, MD CHIEF COMPLAINT:  cardiac arrest    History of Present Illness:  47 year old female with past medical history of recurrent breast cancer not currently on treatment, Crohn's disease s/p subtotal colectomy in 2021 recurrent obstructions, colonic stricture, IDA, recent hospitalization 8/10-8/15/2024 for obstruction seen by GI and general surgery. Underwent EGD and flexible sigmoidoscopy and discharged on steroids who presented to the emergency department on 12/12/2022 with abdominal pain, nausea, vomiting diarrhea. In the ED tachycardic, hypertensive. ED provider notes abdominal distention. She was given 2L IVF, morphine, IV PPI. Labs with WBC 10.8, Hgb 10.7, K 3.2. CT AP was ordered and showed markedly dilated distal small bowel consistent with chronic obstruction. No changes since prior study. Patient was admitted to [facility]. They consulted GI for input. Unfortunately, around 0820 this morning patient was found on the floor in the bathroom in ED pulseless. They estimate downtime around 5 minutes. ACLS was initiated. Concern for possible torsades de pointes and given IV mag, defibrillated. Coded for around 5 minutes. She was intubated. She had copious vomitus in the ETT. ROSC achieved and ICU was consulted. Notably, she had I-stat peri-code which showed hemoglobin of 5.5 down from 10 overnight.   Pertinent  Medical History  Crohn's disease with previous bowel obstruction s/p subtotal colectomy 2021, colonic stricture, IDA, breast cancer s/p mastectomy 2022  Significant Hospital Events: Including procedures, antibiotic start and stop dates in addition to other pertinent events   9/3: admitted to [facility] for abdominal distention, presumed Crohn's exacerbation 9/4: ~0820 cardiac arrest in ED. Admitted to ICU intubated, sedated. Hgb 5.5. Placed right femoral CVC.  Received 3 UPRBC, 3 FFP. 9/5: - Hgb stable at 11.7 after 3u PRBC, 3 FPP 9/5 overnight increasing pressor requirements - added vaso. Bladder pressures 28, went for exploratory lap w/ sb resxn, and celiotomy, intra op findings: internal hernia w/ associated small bowel ischemia. Also seen by GI and palliative. Full scope of care desired  9/6 some ST changes in lateral leads. Weaning pressors. Removing femoral line and placing internal jugular. Renal fxn worse, on-going metabolic acidosis.    Interim History / Subjective:   Sedated, on vent, stable, tolerated exploratory laparotomy 12/16/2022 No overnight events Remains sedated  Objective   Blood pressure 97/76, pulse 66, temperature 98.1 F (36.7 C), resp. rate 15, height 5\' 4"  (1.626 m), weight 65.8 kg, last menstrual period 04/24/2021, SpO2 100%. CVP:  [10 mmHg-14 mmHg] 10 mmHg  Vent Mode: PRVC FiO2 (%):  [30 %] 30 % Set Rate:  [16 bmp] 16 bmp Vt Set:  [440 mL] 440 mL PEEP:  [5 cmH20] 5 cmH20 Plateau Pressure:  [17 cmH20-18 cmH20] 18 cmH20   Intake/Output Summary (Last 24 hours) at 12/17/2022 0839 Last data filed at 12/17/2022 0743 Gross per 24 hour  Intake 3866.13 ml  Output 930 ml  Net 2936.13 ml   Filed Weights   12/15/22 0500 12/16/22 0500 12/17/22 0419  Weight: 59.8 kg 62.3 kg 65.8 kg    Examination: Middle-age, does not appear to be in distress, sedated on vent HENT: Endotracheal tube in place, moist oral mucosa Pulm: Clear breath sounds with decreased air movement at the bases Card: S1-S2 appreciated Abd: Postsurgical abdomen, hypoactive bowel sounds Ext cool to touch. Pulses weak. 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's disease Past history of subtotal colectomy in 2021 with recurrent intestinal obstructions -Pressor needs  continue to improve -Day 5 of Zosyn -On stress dose steroids, decrease dose to 50 twice daily  For agitation management - on Precedex, midazolam,  ST elevation in lateral leads -Has remained hemodynamically stable -No new EKG changes  Small bowel obstruction secondary to internal hernia and resultant ischemic gut status post exploratory laparotomy -Tolerated surgery well 12/16/2022 -No more plans for surgery according to surgical input -Appreciate surgery follow-up -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 as she only has about 100 cc of small got left -TPN ordered  Acute hypoxemic respiratory failure secondary cardiac arrest Aspiration pneumonia -Continue mechanical ventilation -Target TVol 6-8cc/kgIBW -Target Plateau Pressure < 30cm H20 -Target driving pressure less than 15 cm of water -Target PaO2 55-65: titrate PEEP/FiO2 per protocol -Ventilator associated pneumonia prevention protocol  Acute kidney injury secondary to sepsis and endorgan hypoperfusion Renal functions stabilizing -Maintain renal perfusion -Trend electrolytes -Avoid nephrotoxic medications  Acute on chronic anemia -Continue to monitor -Transfuse per protocol  Thrombocytopenia -Likely related to acute illness -Continue to trend  Hyperglycemia -Continue SSI  Shock liver -Continue to trend -LFTs stabilizing  Recurrent breast cancer not currently on treatment  - per Dr. Pamelia Hoit note 10/11/22, patient has declined further treatment at this time.  Plan to have follow-up in September to discuss anti-estrogen therapy.   Goal would be to start weaning sedation and weaning ventilator support as tolerated  Best Practice (right click and "Reselect all SmartList Selections" daily)   Diet/type: NPO DVT prophylaxis: SCD - anemic - receiving PRBC GI prophylaxis: PPI Lines: Central line and yes and it is still needed Foley:  Yes, and it is still needed Code Status:  full code Last  date of multidisciplinary goals of care discussion [updated daughter at bedside]  The patient is critically ill with multiple organ systems failure and requires high complexity decision making for assessment and support, frequent evaluation and titration of therapies, application of advanced monitoring technologies and extensive interpretation of multiple databases. Critical Care Time devoted to patient care services described in this note independent of APP/resident time (if applicable)  is 40 minutes.   Virl Diamond MD [place] [facility]monary Critical Care Personal pager: See Amion If unanswered, please page CCM On-call: #[phone]

## 2022-12-17 NOTE — Progress Notes (Signed)
Progress Note  1 Day Post-Op  Subjective: Pressors coming down. Remains sedated. No ostomy output yet.   Objective: Vital signs in last 24 hours: Temp:  [94.8 F (34.9 C)-98.8 F (37.1 C)] 98.1 F (36.7 C) (09/08 0600) Pulse Rate:  [62-80] 66 (09/08 0600) Resp:  [15-18] 15 (09/08 0600) SpO2:  [98 %-100 %] 100 % (09/08 0600) Arterial Line BP: (103-137)/(64-81) 130/77 (09/08 0600) FiO2 (%):  [30 %] 30 % (09/08 0449) Weight:  [65.8 kg] 65.8 kg (09/08 0419) Last BM Date :  (PTA)  Intake/Output from previous day: 09/07 0701 - 09/08 0700 In: 3744.4 [I.V.:3611.4; IV Piggyback:133] Out: 930 [Urine:925; Blood:5] Intake/Output this shift: Total I/O In: 121.8 [I.V.:111.3; IV Piggyback:10.4] Out: -   PE: General: WD female who is intubated and critically ill Heart: regular, rate, and rhythm.  Normal s1,s2. No obvious murmurs, gallops, or rubs noted.  Palpable pedal pulses bilaterally Lungs: vent sounds bilaterally Abd: Midline dressing clean and dry, ostomy bag without gas/stool GU: foley present with minimal urine that appears somewhat concentrated MS: sedated   Lab Results:  Recent Labs    12/15/22 0505 12/16/22 0345  WBC 25.1* 29.3*  HGB 10.7* 9.4*  HCT 32.0* 27.2*  PLT 126* 87*   BMET Recent Labs    12/16/22 0345 12/17/22 0412  NA 133* 137  K 3.6 3.4*  CL 106 108  CO2 18* 20*  GLUCOSE 145* 147*  BUN 28* 28*  CREATININE 2.05* 1.94*  CALCIUM 7.2* 7.3*   PT/INR No results for input(s): "LABPROT", "INR" in the last 72 hours.  CMP     Component Value Date/Time   NA 137 12/17/2022 0412   NA 135 01/21/2020 1056   K 3.4 (L) 12/17/2022 0412   CL 108 12/17/2022 0412   CO2 20 (L) 12/17/2022 0412   GLUCOSE 147 (H) 12/17/2022 0412   BUN 28 (H) 12/17/2022 0412   BUN 12 01/21/2020 1056   CREATININE 1.94 (H) 12/17/2022 0412   CREATININE 0.91 09/21/2022 1141   CALCIUM 7.3 (L) 12/17/2022 0412   PROT 3.7 (L) 12/17/2022 0412   ALBUMIN <1.5 (L) 12/17/2022 0412    AST 82 (H) 12/17/2022 0412   AST 18 09/21/2022 1141   ALT 62 (H) 12/17/2022 0412   ALT 12 09/21/2022 1141   ALKPHOS 116 12/17/2022 0412   BILITOT 1.7 (H) 12/17/2022 0412   BILITOT 0.4 09/21/2022 1141   GFRNONAA 32 (L) 12/17/2022 0412   GFRNONAA >60 09/21/2022 1141   GFRAA 108 01/21/2020 1056   Lipase     Component Value Date/Time   LIPASE 19 12/12/2022 2130       Studies/Results: DG Chest Port 1 View  Result Date: 12/16/2022 CLINICAL DATA:  Respiratory dependence EXAM: PORTABLE CHEST 1 VIEW COMPARISON:  CXR 12/15/22 FINDINGS: Endotracheal tube terminates approximately 3 cm above the carina. Right arm PICC with tip near the cavoatrial junction. Enteric tube courses below diaphragm with the side hole near the GE junction in the tip over the expected location of the stomach. Left-sided needle accessed port in place with the tip at the cavoatrial junction. No pleural effusion. No pneumothorax. No new focal airspace opacity. Slightly improved airspace opacities in the right mid lung field. Unchanged cardiac and mediastinal contours. No radiographically apparent displaced rib fractures. IMPRESSION: 1. Slightly improved airspace opacities in the right mid lung field. 2. Enteric tube side hole is near the GE junction. Recommend slight advancement. Electronically Signed   By: Lorenza Cambridge M.D.   On:  12/16/2022 11:14   DG CHEST PORT 1 VIEW  Result Date: 12/15/2022 CLINICAL DATA:  Central line placement. EXAM: PORTABLE CHEST 1 VIEW COMPARISON:  Same day. FINDINGS: Interval placement of right subclavian catheter with distal tip in expected position of the SVC. No pneumothorax is noted. Stable bilateral lung opacities as noted on prior exam. IMPRESSION: Interval placement of right subclavian catheter with distal tip in expected position of the SVC. Electronically Signed   By: Lupita Raider M.D.   On: 12/15/2022 15:57   ECHOCARDIOGRAM COMPLETE  Result Date: 12/15/2022    ECHOCARDIOGRAM REPORT    Patient Name:   [name] Date of Exam: 12/15/2022 Medical Rec #:  [id number]        Height:       64.0 in Accession #:    [id number]       Weight:       131.8 lb Date of Birth:  07/29/1975        BSA:          1.639 m Patient Age:    47 years         BP:           123/84 mmHg Patient Gender: F                HR:           85 bpm. Exam Location:  Inpatient Procedure: 2D Echo, Color Doppler and Cardiac Doppler Indications:    Shock  History:        Patient has prior history of Echocardiogram examinations, most                 recent 12/13/2022. Breast CA, family hx breast CA, Crohn's                 disease.  Sonographer:    [name] Referring Phys: [id number] PETER E BABCOCK  Sonographer Comments: Suboptimal apical window and echo performed with patient supine and on artificial respirator. Image acquisition challenging due to patient body habitus, Image acquisition challenging due to respiratory motion and Image acquisition challenging due to breast implants. IMPRESSIONS  1. Left ventricular ejection fraction, by estimation, is 60 to 65%. The left ventricle has normal function. The left ventricle has no regional wall motion abnormalities. Left ventricular diastolic parameters are indeterminate. There is incoordiante septal motion.  2. Right ventricular systolic function is low normal. The right ventricular size is normal. There is normal pulmonary artery systolic pressure. The estimated right ventricular systolic pressure is 23.7 mmHg.  3. The mitral valve is normal in structure. No evidence of mitral valve regurgitation. No evidence of mitral stenosis.  4. The tricuspid valve is abnormal. Tricuspid valve regurgitation is mild to moderate.  5. The aortic valve is tricuspid. Aortic valve regurgitation is not visualized. No aortic stenosis is present.  6. The inferior vena cava is normal in size with <50% respiratory variability, suggesting right atrial pressure of 8 mmHg. Comparison(s): Changes from prior study  are noted. 12/13/2022: LVEF 50-55%, moderate RV enlargment and mild RV systolic dysfunction. FINDINGS  Left Ventricle: Left ventricular ejection fraction, by estimation, is 60 to 65%. The left ventricle has normal function. The left ventricle has no regional wall motion abnormalities. The left ventricular internal cavity size was normal in size. There is  no left ventricular hypertrophy. Incoordiante septal motion. Left ventricular diastolic parameters are indeterminate. Right Ventricle: The right ventricular size is normal. Right vetricular wall thickness was not well  visualized. Right ventricular systolic function is low normal. There is normal pulmonary artery systolic pressure. The tricuspid regurgitant velocity is 1.98 m/s, and with an assumed right atrial pressure of 8 mmHg, the estimated right ventricular systolic pressure is 23.7 mmHg. Left Atrium: Left atrial size was normal in size. Right Atrium: Right atrial size was normal in size. Pericardium: There is no evidence of pericardial effusion. Mitral Valve: The mitral valve is normal in structure. No evidence of mitral valve regurgitation. No evidence of mitral valve stenosis. Tricuspid Valve: The tricuspid valve is abnormal. Tricuspid valve regurgitation is mild to moderate. Aortic Valve: The aortic valve is tricuspid. Aortic valve regurgitation is not visualized. No aortic stenosis is present. Pulmonic Valve: The pulmonic valve was not well visualized. Pulmonic valve regurgitation is not visualized. Aorta: The aortic root and ascending aorta are structurally normal, with no evidence of dilitation. Venous: The inferior vena cava is normal in size with less than 50% respiratory variability, suggesting right atrial pressure of 8 mmHg. IAS/Shunts: No atrial level shunt detected by color flow Doppler.  LEFT VENTRICLE PLAX 2D LVIDd:         3.70 cm     Diastology LVIDs:         3.20 cm     LV e' medial:  6.64 cm/s LV PW:         0.70 cm     LV e' lateral: 7.29  cm/s LV IVS:        0.70 cm LVOT diam:     1.80 cm LVOT Area:     2.54 cm  LV Volumes (MOD) LV vol d, MOD A4C: 77.4 ml LV vol s, MOD A4C: 28.4 ml LV SV MOD A4C:     77.4 ml RIGHT VENTRICLE             IVC RV S prime:     10.70 cm/s  IVC diam: 1.50 cm TAPSE (M-mode): 1.5 cm LEFT ATRIUM           Index       RIGHT ATRIUM          Index LA diam:      2.20 cm 1.34 cm/m  RA Area:     6.74 cm LA Vol (A4C): 9.2 ml  5.63 ml/m  RA Volume:   11.14 ml 6.80 ml/m   AORTA Ao Root diam: 2.40 cm Ao Asc diam:  2.60 cm TRICUSPID VALVE TR Peak grad:   15.7 mmHg TR Mean grad:   12.0 mmHg TR Vmax:        198.00 cm/s TR Vmean:       168.0 cm/s  SHUNTS Systemic Diam: 1.80 cm Courtney Shutter MD Electronically signed by Courtney Shutter MD Signature Date/Time: 12/15/2022/2:53:11 PM    Final     Anti-infectives: Anti-infectives (From admission, onward)    Start     Dose/Rate Route Frequency Ordered Stop   12/13/22 1000  piperacillin-tazobactam (ZOSYN) IVPB 3.375 g        3.375 g 12.5 mL/hr over 240 Minutes Intravenous Every 8 hours 12/13/22 9528          Assessment/Plan  Crohn's disease s/p subtotal colectomy now s/p ex-lap and SBR for internal hernia - No plans for additional surgery at this time - Can consider enteric feeds/PO meds whens he is off pressors.  She only has approximately 100cm of small bowel left and will almost definitely be TPN dependent. - Remainder of care per ICU/primary - Midline incision care: WTD  BID   LOS: 4 days   I reviewed Consultant GI notes, last 24 h vitals and pain scores, last 48 h intake and output, last 24 h labs and trends, last 24 h imaging results, and CCM notes .   Moise Boring, MD [facility] Surgery 12/17/2022, 8:17 AM Please see Amion for pager number during day hours 7:00am-4:30pm

## 2022-12-17 NOTE — Plan of Care (Signed)
  Problem: Fluid Volume: Goal: Ability to maintain a balanced intake and output will improve Outcome: Progressing   Problem: Metabolic: Goal: Ability to maintain appropriate glucose levels will improve Outcome: Progressing   Problem: Clinical Measurements: Goal: Ability to maintain clinical measurements within normal limits will improve Outcome: Progressing Goal: Will remain free from infection Outcome: Progressing Goal: Diagnostic test results will improve Outcome: Progressing Goal: Respiratory complications will improve Outcome: Progressing Goal: Cardiovascular complication will be avoided Outcome: Progressing   Problem: Nutritional: Goal: Maintenance of adequate nutrition will improve Outcome: Not Progressing   Problem: Nutrition: Goal: Adequate nutrition will be maintained Outcome: Not Progressing

## 2022-12-17 NOTE — Progress Notes (Signed)
PHARMACY - TOTAL PARENTERAL NUTRITION CONSULT NOTE   Indication: Short bowel syndrome  Patient Measurements: Height: 5\' 4"  (162.6 cm) Weight: 65.8 kg (145 lb 1 oz) IBW/kg (Calculated) : 54.7   Body mass index is 24.9 kg/m. Usual Weight: 46.3 kg from 8/28  Assessment: 47 y.o. female with PMH of recurrent breast cancer not currently on treatment, Crohn's disease s/p subtotal colectomy in 2021, recurrent obstructions, colonic stricture, recent hospitalization 8/10-8/15/2024 for obstruction who presented on 12/12/2022 with abdominal pain, nausea, vomiting diarrhea.    9/4 Admit; found down in ED bathroom with no pulse, CPR> intubated 9/5 s/p ex-lap, found to have internal hernia with small bowel ischemia, only 105cm of small bowel remaining OR 9/7 for closure, ostomy creation 9/8 pharmacy consulted to manage TPN. Pt intubated & sedated. On Levophed @ 2 mcg/min  Glucose / Insulin: no Hx DM, on steroids PTA for Crohn's disease, tapering SoluCortef to 50 q12h today CBGs 120 - 143, 5 U SSI  given 9/7 Electrolytes: K 3.4, Corr Ca 9.3; CO2 low, Mag 1.8; phos 2.6 Renal: AKI, SCr 1.94, BUN 28 Hepatic: albumin low. AST/ALT slightly elevated, down trending T bili 1.7 Intake / Output; MIVF:  I/O + 2814 ml UOP 925 mls/24 hrs IVF: D5LR @ 125 ml/hr GI Imaging: GI Surgeries / Procedures:  OR 9/5 ex lap, internal hernia w/ small bowel ischemia, SBR & celiotomy; > 105 cm small bowel remaining; OR 9/7 for closure, ostomy creation Central access: triple lumen CVC R subclavian 9/4, triple lumen CVC  $ femoral 9/4;  PAC 05/02/22 TPN start date: 12/17/22  Nutritional Goals: Goal TPN rate is 60 mL/hr (provides ~ 86 g of protein and 1454 kcals per day)   RD Assessment: Estimated Needs Total Energy Estimated Needs: 1400-1600 kcals Total Protein Estimated Needs: 85-95 grams Total Fluid Estimated Needs: >/= 1.4L  Current Nutrition:  NPO  Plan: K 4 runs IV now, Mag 1 gm IVPB x 1 now Start TPN at 67mL/hr  at 1800 and assess tolerance Electrolytes in TPN: Na 31mEq/L, K 78mEq/L, Ca 73mEq/L, Mg 19mEq/L, and Phos 35mmol/L. Cl:Ac 1:1 Add standard MVI and trace elements to TPN thiamine 100 mg IV x 5 days  continue Sensitive q4h SSI and adjust as needed  Reduce MIVF D5LR to 95 mL/hr at 1800 Monitor TPN labs on Mon/Thurs  Herby Abraham, Pharm.D Use secure chat for questions 12/17/2022 10:47 AM

## 2022-12-18 ENCOUNTER — Encounter (HOSPITAL_COMMUNITY): Admission: RE | Payer: Self-pay | Source: Home / Self Care

## 2022-12-18 ENCOUNTER — Encounter (HOSPITAL_COMMUNITY): Payer: Self-pay | Admitting: General Surgery

## 2022-12-18 ENCOUNTER — Ambulatory Visit (HOSPITAL_COMMUNITY): Admission: RE | Admit: 2022-12-18 | Payer: Managed Care, Other (non HMO) | Source: Home / Self Care

## 2022-12-18 LAB — COMPREHENSIVE METABOLIC PANEL
ALT: 49 U/L — ABNORMAL HIGH (ref 0–44)
AST: 50 U/L — ABNORMAL HIGH (ref 15–41)
Albumin: <1.5 g/dL — ABNORMAL LOW (ref 3.5–5.0)
Alkaline Phosphatase: 97 U/L (ref 38–126)
Anion gap: 9 (ref 5–15)
BUN: 31 mg/dL — ABNORMAL HIGH (ref 6–20)
CO2: 21 mmol/L — ABNORMAL LOW (ref 22–32)
Calcium: 7.4 mg/dL — ABNORMAL LOW (ref 8.9–10.3)
Chloride: 109 mmol/L (ref 98–111)
Creatinine, Ser: 1.8 mg/dL — ABNORMAL HIGH (ref 0.44–1.00)
GFR, Estimated: 35 mL/min — ABNORMAL LOW (ref >60)
Glucose, Bld: 193 mg/dL — ABNORMAL HIGH (ref 70–99)
Potassium: 3.6 mmol/L (ref 3.5–5.1)
Sodium: 139 mmol/L (ref 135–145)
Total Bilirubin: 1.1 mg/dL (ref 0.3–1.2)
Total Protein: 3.6 g/dL — ABNORMAL LOW (ref 6.5–8.1)

## 2022-12-18 LAB — CBC WITH DIFFERENTIAL/PLATELET
Abs Immature Granulocytes: 0.43 10*3/uL — ABNORMAL HIGH (ref 0.00–0.07)
Basophils Absolute: 0 10*3/uL (ref 0.0–0.1)
Basophils Relative: 0 %
Eosinophils Absolute: 0 10*3/uL (ref 0.0–0.5)
Eosinophils Relative: 0 %
HCT: 23.9 % — ABNORMAL LOW (ref 36.0–46.0)
Hemoglobin: 8.2 g/dL — ABNORMAL LOW (ref 12.0–15.0)
Immature Granulocytes: 2 %
Lymphocytes Relative: 2 %
Lymphs Abs: 0.4 10*3/uL — ABNORMAL LOW (ref 0.7–4.0)
MCH: 28.5 pg (ref 26.0–34.0)
MCHC: 34.3 g/dL (ref 30.0–36.0)
MCV: 83 fL (ref 80.0–100.0)
Monocytes Absolute: 0.3 10*3/uL (ref 0.1–1.0)
Monocytes Relative: 1 %
Neutro Abs: 18.9 10*3/uL — ABNORMAL HIGH (ref 1.7–7.7)
Neutrophils Relative %: 95 %
Platelets: 52 10*3/uL — ABNORMAL LOW (ref 150–400)
RBC: 2.88 MIL/uL — ABNORMAL LOW (ref 3.87–5.11)
RDW: 19.8 % — ABNORMAL HIGH (ref 11.5–15.5)
WBC: 20 10*3/uL — ABNORMAL HIGH (ref 4.0–10.5)
nRBC: 0.1 % (ref 0.0–0.2)

## 2022-12-18 LAB — BLOOD GAS, ARTERIAL
Acid-base deficit: 1.5 mmol/L (ref 0.0–2.0)
Bicarbonate: 21.3 mmol/L (ref 20.0–28.0)
Drawn by: 11249
FIO2: 28 %
MECHVT: 440 mL
O2 Saturation: 95.9 %
PEEP: 5 cmH2O
Patient temperature: 36.2
RATE: 16 {breaths}/min
pCO2 arterial: 29 mmHg — ABNORMAL LOW (ref 32–48)
pH, Arterial: 7.47 — ABNORMAL HIGH (ref 7.35–7.45)
pO2, Arterial: 121 mmHg — ABNORMAL HIGH (ref 83–108)

## 2022-12-18 LAB — SURGICAL PATHOLOGY

## 2022-12-18 LAB — GLUCOSE, CAPILLARY
Glucose-Capillary: 160 mg/dL — ABNORMAL HIGH (ref 70–99)
Glucose-Capillary: 157 mg/dL — ABNORMAL HIGH (ref 70–99)
Glucose-Capillary: 148 mg/dL — ABNORMAL HIGH (ref 70–99)
Glucose-Capillary: 176 mg/dL — ABNORMAL HIGH (ref 70–99)

## 2022-12-18 LAB — CULTURE, BLOOD (ROUTINE X 2)
Culture: NO GROWTH
Culture: NO GROWTH
Special Requests: ADEQUATE
Special Requests: ADEQUATE

## 2022-12-18 LAB — MAGNESIUM: Magnesium: 1.9 mg/dL (ref 1.7–2.4)

## 2022-12-18 LAB — PHOSPHORUS: Phosphorus: 2.3 mg/dL — ABNORMAL LOW (ref 2.5–4.6)

## 2022-12-18 LAB — TRIGLYCERIDES: Triglycerides: 113 mg/dL (ref <150)

## 2022-12-18 SURGERY — LATISSIMUS FLAP TO BREAST
Anesthesia: General | Laterality: Right

## 2022-12-18 MED ORDER — ACETAMINOPHEN 160 MG/5ML PO SOLN
650.0000 mg | Freq: Four times a day (QID) | ORAL | Status: DC
  Administered 2022-12-18 – 2022-12-20 (×7): 650 mg
  Filled 2022-12-18 (×8): qty 20.3

## 2022-12-18 MED ORDER — PROPOFOL 1000 MG/100ML IV EMUL
5.0000 ug/kg/min | INTRAVENOUS | Status: DC
  Administered 2022-12-18: 5 ug/kg/min via INTRAVENOUS
  Administered 2022-12-19: 15 ug/kg/min via INTRAVENOUS
  Filled 2022-12-18 (×2): qty 100

## 2022-12-18 MED ORDER — INSULIN ASPART 100 UNIT/ML IJ SOLN
0.0000 [IU] | Freq: Four times a day (QID) | INTRAMUSCULAR | Status: DC
  Administered 2022-12-18: 2 [IU] via SUBCUTANEOUS
  Administered 2022-12-18 – 2022-12-19 (×2): 1 [IU] via SUBCUTANEOUS

## 2022-12-18 MED ORDER — SODIUM CHLORIDE 0.9 % IV SOLN: INTRAVENOUS | Status: DC

## 2022-12-18 MED ORDER — TRAVASOL 10 % IV SOLN
INTRAVENOUS | Status: AC
  Filled 2022-12-18: qty 904.8

## 2022-12-18 MED ORDER — FUROSEMIDE 10 MG/ML IJ SOLN
40.0000 mg | Freq: Once | INTRAMUSCULAR | Status: AC
  Administered 2022-12-18: 40 mg via INTRAVENOUS
  Filled 2022-12-18: qty 4

## 2022-12-18 MED ORDER — POTASSIUM PHOSPHATES 15 MMOLE/5ML IV SOLN
30.0000 mmol | Freq: Once | INTRAVENOUS | Status: AC
  Administered 2022-12-18: 30 mmol via INTRAVENOUS
  Filled 2022-12-18: qty 10

## 2022-12-18 MED ORDER — OXYCODONE HCL 5 MG/5ML PO SOLN
7.5000 mg | ORAL | Status: DC | PRN
  Administered 2022-12-18: 7.5 mg
  Filled 2022-12-18 (×2): qty 10

## 2022-12-18 MED ORDER — HYDROCORTISONE SOD SUC (PF) 100 MG IJ SOLR
50.0000 mg | Freq: Every day | INTRAMUSCULAR | Status: DC
  Administered 2022-12-19 – 2022-12-21 (×3): 50 mg via INTRAVENOUS
  Filled 2022-12-18 (×3): qty 2

## 2022-12-18 NOTE — Progress Notes (Signed)
Pt placed back on full vent support due to increase heart rate.

## 2022-12-18 NOTE — Progress Notes (Signed)
Afternoon round/update. Patient more restless. Increasing HR. Started her on PRN tylenol, PRN oxycodone. We dc precedex this AM due to bradycardia and kept her on versed at 0.5. Versed does not seem to be helping her symptoms and do not want to increased her sedation with benzodiazepines due to risk of delirium, tachyphylaxis. Thus, starting her back on propofol. Ordered triglycerides for AM.  We did give her Lasix this AM and she has had good response. Her CVP is now 3 and could certainly be causing her increased HR but will not chase her with fluid at this time. Will let her equilibrate overnight.  Low grade fever today. If she is still tachycardic, febrile, will get CXR, lipase in the morning and consider scan for PE tomorrow.

## 2022-12-18 NOTE — Progress Notes (Addendum)
PHARMACY - TOTAL PARENTERAL NUTRITION CONSULT NOTE   Indication: Short bowel syndrome  Patient Measurements: Height: 5\' 4"  (162.6 cm) Weight: 65.8 kg (145 lb 1 oz) IBW/kg (Calculated) : 54.7   Body mass index is 24.9 kg/m. Usual Weight: 46.3 kg from 8/28  Assessment: 47 y.o. female with PMH of recurrent breast cancer not currently on treatment, Crohn's disease s/p subtotal colectomy in 2021, recurrent obstructions, colonic stricture, recent hospitalization 8/10-8/15/2024 for obstruction who presented on 12/12/2022 with abdominal pain, nausea, vomiting diarrhea.    9/4 Admit; found down in ED bathroom with no pulse, CPR > intubated 9/5 s/p ex-lap, found to have internal hernia with small bowel ischemia, only 105 cm of small bowel remaining OR 9/7 for closure, ostomy creation 9/8 pharmacy consulted to manage TPN  Glucose / Insulin: no Hx DM - steroids PTA for Crohn's disease, tapering SoluCortef to 50 q24 today - also on dextrose-containing IVF at high rate - CBGs mostly controlled prior to starting TPN, now up slightly ~160 after initiation at 30 ml/hr (SBG 193 this AM) - 5 units SSI given yesterday Electrolytes: K, Phos borderline low; bicarb low (suspect d/t recent arrest, pressors); others stable WNL Renal: AKI; SCr remains elevated but trending down (baseline < 1.0); BUN elevated/stable; UOP borderline low Hepatic: Enzymes, Tbili bumped after arrest, now essentially normalized - albumin low on admission and now remains < 1.5 - TG WNL 9/9 I/O:  - Charting shows +20L since admission (weight did increase by 9 kg since admit - this more likely represents actual net fluid gain) - minimal NG output - LBM 9/9 (small amount) - mIVF: D5LR @ 95 ml/hr GI Imaging: GI Surgeries / Procedures:  - OR 9/5 ex lap, internal hernia w/ small bowel ischemia, SBR & celiotomy; > 105 cm small bowel remaining; - OR 9/7 for closure, ostomy creation Central access: triple lumen CVC R subclavian 9/4,  triple lumen CVC R femoral 9/4;  PAC 05/02/22 TPN start date: 12/17/22  Nutritional Goals: Goal TPN rate is 65 mL/hr (provides 90 g/d protein, and 1572 kcals per day)   RD Assessment: Estimated Needs Total Energy Estimated Needs: 1400-1600 kcals Total Protein Estimated Needs: 85-95 grams Total Fluid Estimated Needs: >/= 1.4L  Current Nutrition:  NPO  Plan:  KPhos 30 mmol IV x 1  Increase TPN to 60 mL/hr at 1800 Electrolytes in TPN: increase Ac; will leave lyte conc unchanged while increasing rate Na - 50 mEq/L K - 50 mEq/L Ca - 5 mEq/L Mg - 5 mEq/L Phos - 15 mmol/L Cl:Ac ratio - 1:1 >> max Ac Add standard MVI and trace elements to TPN Receiving thiamine 100 mg x 5 days outside of TPN per RD recs Adjust sensitive SSI to q6 hr monitoring At 1800, reduce mIVF to 50 ml/hr & chg to plain NS Monitor TPN labs on Mon/Thurs BMP, Phos tomorrow  Bernadene Person, PharmD, BCPS [phone] 12/18/2022, 11:59 AM

## 2022-12-18 NOTE — Progress Notes (Signed)
Chaplain attempted follow-up visit but daughter was sleeping at bedside. Chaplain will follow-up.     12/18/22 1400  Spiritual Encounters  Type of Visit Follow up;Attempt (pt unavailable)

## 2022-12-18 NOTE — Progress Notes (Signed)
Progress Note  2 Days Post-Op  Subjective: Off pressors this morning.   Objective: Vital signs in last 24 hours: Temp:  [96.6 F (35.9 C)-98.2 F (36.8 C)] 97.2 F (36.2 C) (09/09 0302) Pulse Rate:  [49-72] 65 (09/09 0302) Resp:  [16-19] 16 (09/09 0302) BP: (119)/(76) 119/76 (09/09 0302) SpO2:  [99 %-100 %] 100 % (09/09 0302) Arterial Line BP: (91-138)/(53-79) 130/70 (09/09 0300) FiO2 (%):  [28 %-30 %] 28 % (09/09 0302) Last BM Date :  (PTA)  Intake/Output from previous day: 09/08 0701 - 09/09 0700 In: 1941.9 [I.V.:1548.4; IV Piggyback:393.5] Out: 805 [Urine:780; Stool:25] Intake/Output this shift: Total I/O In: 1668.3 [I.V.:1611.7; IV Piggyback:56.6] Out: -   PE: General: WD female who is intubated and critically ill Heart: regular, rate, and rhythm.  Normal s1,s2. No obvious murmurs, gallops, or rubs noted.  Palpable pedal pulses bilaterally Lungs: vent sounds bilaterally Abd: Midline wound is clean, fascia is intact, ostomy pink and viable, scant liquid stool in bag GU: foley present  MS: sedated   Lab Results:  Recent Labs    12/16/22 0345 12/17/22 1003  WBC 29.3* 28.2*  HGB 9.4* 8.9*  HCT 27.2* 25.8*  PLT 87* 56*   BMET Recent Labs    12/17/22 0412 12/17/22 1729  NA 137 136  K 3.4* 3.7  CL 108 109  CO2 20* 21*  GLUCOSE 147* 147*  BUN 28* 27*  CREATININE 1.94* 1.86*  CALCIUM 7.3* 7.2*   PT/INR No results for input(s): "LABPROT", "INR" in the last 72 hours.  CMP     Component Value Date/Time   NA 136 12/17/2022 1729   NA 135 01/21/2020 1056   K 3.7 12/17/2022 1729   CL 109 12/17/2022 1729   CO2 21 (L) 12/17/2022 1729   GLUCOSE 147 (H) 12/17/2022 1729   BUN 27 (H) 12/17/2022 1729   BUN 12 01/21/2020 1056   CREATININE 1.86 (H) 12/17/2022 1729   CREATININE 0.91 09/21/2022 1141   CALCIUM 7.2 (L) 12/17/2022 1729   PROT 3.5 (L) 12/17/2022 1729   ALBUMIN <1.5 (L) 12/17/2022 1729   AST 63 (H) 12/17/2022 1729   AST 18 09/21/2022 1141    ALT 55 (H) 12/17/2022 1729   ALT 12 09/21/2022 1141   ALKPHOS 99 12/17/2022 1729   BILITOT 1.5 (H) 12/17/2022 1729   BILITOT 0.4 09/21/2022 1141   GFRNONAA 34 (L) 12/17/2022 1729   GFRNONAA >60 09/21/2022 1141   GFRAA 108 01/21/2020 1056   Lipase     Component Value Date/Time   LIPASE 19 12/12/2022 2130       Studies/Results: No results found.  Anti-infectives: Anti-infectives (From admission, onward)    Start     Dose/Rate Route Frequency Ordered Stop   12/13/22 1000  piperacillin-tazobactam (ZOSYN) IVPB 3.375 g        3.375 g 12.5 mL/hr over 240 Minutes Intravenous Every 8 hours 12/13/22 7829          Assessment/Plan  Crohn's disease s/p subtotal colectomy now s/p ex-lap and SBR for internal hernia - No plans for additional surgery at this time - Can consider enteric feeds/PO meds whens she is off pressors.  She only has approximately 100cm of small bowel left and will almost definitely be TPN dependent. - Remainder of care per ICU/primary - Midline incision care: WTD BID   LOS: 5 days   I reviewed Consultant GI notes, last 24 h vitals and pain scores, last 48 h intake and output, last 24 h  labs and trends, last 24 h imaging results, and CCM notes .   Berna Bue, MD [facility] 12/18/2022, 8:02 AM Please see Amion for pager number during day hours 7:00am-4:30pm

## 2022-12-18 NOTE — Progress Notes (Signed)
A-line removed with no complications at this time per MD order.

## 2022-12-18 NOTE — Progress Notes (Signed)
Per Anders Simmonds verbal order to discontinue arterial line. RT was unable to locate original order for arterial line to be inserted (placed by MD Myrla Halsted CCM). RN aware that arterial line will be discontinued.

## 2022-12-18 NOTE — Progress Notes (Signed)
NAME:  [name], MRN:  [id number], DOB:  Jan 23, 1976, LOS: 5 ADMISSION DATE:  12/12/2022, CONSULTATION DATE:  12/13/22 REFERRING MD:  Rubin Payor, MD CHIEF COMPLAINT:  cardiac arrest    History of Present Illness:  47 year old female with past medical history of recurrent breast cancer not currently on treatment, Crohn's disease s/p subtotal colectomy in 2021 recurrent obstructions, colonic stricture, IDA, recent hospitalization 8/10-8/15/2024 for obstruction seen by GI and general surgery. Underwent EGD and flexible sigmoidoscopy and discharged on steroids who presented to the emergency department on 12/12/2022 with abdominal pain, nausea, vomiting diarrhea. In the ED tachycardic, hypertensive. ED provider notes abdominal distention. She was given 2L IVF, morphine, IV PPI. Labs with WBC 10.8, Hgb 10.7, K 3.2. CT AP was ordered and showed markedly dilated distal small bowel consistent with chronic obstruction. No changes since prior study. Patient was admitted to [facility]. They consulted GI for input. Unfortunately, around 0820 this morning patient was found on the floor in the bathroom in ED pulseless. They estimate downtime around 5 minutes. ACLS was initiated. Concern for possible torsades de pointes and given IV mag, defibrillated. Coded for around 5 minutes. She was intubated. She had copious vomitus in the ETT. ROSC achieved and ICU was consulted. Notably, she had I-stat peri-code which showed hemoglobin of 5.5 down from 10 overnight.   Pertinent  Medical History  Crohn's disease with previous bowel obstruction s/p subtotal colectomy 2021, colonic stricture, IDA, breast cancer s/p mastectomy 2022  Significant Hospital Events: Including procedures, antibiotic start and stop dates in addition to other pertinent events   9/3: admitted to [facility] for abdominal distention, presumed Crohn's exacerbation 9/4: ~0820 cardiac arrest in ED. Admitted to ICU intubated, sedated. Hgb 5.5. Placed right femoral CVC.  Received 3 UPRBC, 3 FFP. 9/5: - Hgb stable at 11.7 after 3u PRBC, 3 FPP 9/5 overnight increasing pressor requirements - added vaso. Bladder pressures 28, went for exploratory lap w/ sb resxn, and celiotomy, intra op findings: internal hernia w/ associated small bowel ischemia. Also seen by GI and palliative. Full scope of care desired  9/6 some ST changes in lateral leads. Weaning pressors. Removing femoral line and placing internal jugular. Renal fxn worse, on-going metabolic acidosis.  9/7: back to OR for ostomy placement.  9/8: off levo 9/9: weaning sedation. Following commands. Tried PS of 5/5 which Vt did not hold up. Giving IV lasix for anasarca, she's 20+ L up on admission. Will try SBT again this afternoon.  Interim History / Subjective:  Intubated, mildly sedated but following my commands  Objective   Blood pressure 119/76, pulse 65, temperature (!) 97.2 F (36.2 C), resp. rate 16, height 5\' 4"  (1.626 m), weight 65.8 kg, last menstrual period 04/24/2021, SpO2 100%.    Vent Mode: PRVC FiO2 (%):  [28 %-30 %] 28 % Set Rate:  [16 bmp] 16 bmp Vt Set:  [440 mL] 440 mL PEEP:  [5 cmH20] 5 cmH20 Plateau Pressure:  [18 cmH20-19 cmH20] 18 cmH20   Intake/Output Summary (Last 24 hours) at 12/18/2022 0710 Last data filed at 12/18/2022 0500 Gross per 24 hour  Intake 1941.92 ml  Output 805 ml  Net 1136.92 ml   Filed Weights   12/15/22 0500 12/16/22 0500 12/17/22 0419  Weight: 59.8 kg 62.3 kg 65.8 kg    Examination: General: Female in bed intubated, sedated in no acute distress HENT: Endotracheal tube in place, moist oral mucosa, mild conjunctival edema Pulm: Clear breath sounds bilaterally, synchronous on the vent Card: Sinus rhythm.  Intermittent bradycardia to the high 30s.  No murmurs, rubs, gallops Abd: Postsurgical site is clean, dry.  Ostomy in the left abdomen is pink, moist without dusky appearance.  There is a small amount of output. Extremities: edematous  Neuro: Sedated on the  vent.  She does slightly squeeze my hand on command  Resolved Hospital Problem list   Cardiac arrest  Shock Assessment & Plan:  Sepsis: Likely secondary to bacterial translocation from ischemic gut.  Levophed off 12/17/2022. -Continue Zosyn -taper steroids to daily solu-cortef   Bradycardia; per nursing has had intermittent bradycardia to the 30s overnight.  No associated hypotension -Stopped Precedex this morning -Will continue to monitor  ST elevation in the lateral leads -Remains stable -No new changes on EKG  Small bowel obstruction secondary to internal hernia and resultant ischemic gut status post exploratory laparotomy, status post ostomy placement.  Tolerated surgery well yesterday.  Surgical site appears clean, dry, and intact. Now having output -Appreciate surgery recommendations -Continue TPN, appreciate dietary input -Entyvio on hold  Acute hypoxemic respiratory failure secondary cardiac arrest aspiration; on minimal vent settings. Weaning sedation for SBT. Did attempt PS of 5/5, SBI borderline. Will try again after diuresis.  -Wake up today. Try PS  -40mg  Lasix IV today to optimize  -RASS Goal 0 to -1 -Ventilator associated pneumonia prevention protocol  AKI secondary to sepsis and endorgan hypoperfusion -Maintain renal perfusion -Trend electrolytes -Avoid nephrotoxic medications  Acute on chronic anemia -Continue to monitor -Transfuse per protocol  Thrombocytopenia -Likely related to acute illness -Continue to trend  Hyperglycemia -Continue SSI  Shock liver; LFTs downtrending -Continue to trend  Recurrent breast cancer not currently on treatment; per Dr. Pamelia Hoit note 10/11/22, patient has declined further treatment at this time.  -follow-up in September to discuss anti-estrogen therapy.   Best Practice (right click and "Reselect all SmartList Selections" daily)   Diet/type: NPO DVT prophylaxis: SCD - anemic - receiving PRBC GI prophylaxis: PPI Lines:  Central line, Arterial Line, and yes and it is still needed Foley:  Yes, and it is still needed Code Status:  full code Last date of multidisciplinary goals of care discussion: Answered all questions for family at bedside at this time.  Critical care time: 30

## 2022-12-18 NOTE — Progress Notes (Signed)
Nutrition Follow-up  DOCUMENTATION CODES:   Non-severe (moderate) malnutrition in context of chronic illness  INTERVENTION:   - Concern for malabsorption issues as patient documented to only have ~105cm small bowel remaining.   - Monitor magnesium, potassium, and phosphorus for at least 3 days, MD to replete as needed, as pt is at risk for refeeding syndrome given malnutrition with severe weight loss over the past 4 months. -Continue  100mg  thiamine for at least 5 days. -Daily weights while on TPN -TPN management per Pharmacy - updated needs below.   NUTRITION DIAGNOSIS:   Moderate Malnutrition related to chronic illness (Crohn's disease; metastatic breast cancer) as evidenced by moderate fat depletion, moderate muscle depletion, percent weight loss (24% in 4.5 months).  Ongoing.  GOAL:   Patient will meet greater than or equal to 90% of their needs  Progressing with TPN.  MONITOR:   Vent status, Labs, Weight trends, I & O's, TPN  ASSESSMENT:   47 y.o. female with PMH of recurrent breast cancer not currently on treatment, Crohn's disease s/p subtotal colectomy in 2021, recurrent obstructions, colonic stricture, recent hospitalization 8/10-8/15/2024 for obstruction who presented on 12/12/2022 with abdominal pain, nausea, vomiting diarrhea.  Patient is currently intubated on ventilator support since 9/4. MV: 9.9 L/min Temp (24hrs), Avg:97.4 F (36.3 C), Min:96.6 F (35.9 C), Max:99.5 F (37.5 C) MAP: 91  Patient was started on TPN 9/8 at 30 ml/hr.  Per Pharmacy note today, will advance to 60 ml/hr but 65 ml/hr ordered, providing 1572 kcals and 90g protein. Pt at risk of refeeding. Phos labs low this morning.   Medications: Lasix, Thiamine, Precedex, D5 infusion, K-Phos  Labs reviewed: CBGs: 149-160 Low Phos  Diet Order:   Diet Order             Diet NPO time specified  Diet effective now                   EDUCATION NEEDS:   Education needs have been  addressed  Skin:  Skin Assessment: Skin Integrity Issues: Skin Integrity Issues:: Incisions Incisions: Abdomen  Last BM:  9/9 -type 7 -ileostomy  Height:   Ht Readings from Last 1 Encounters:  12/13/22 5\' 4"  (1.626 m)    Weight:   Wt Readings from Last 1 Encounters:  12/17/22 65.8 kg    BMI:  Body mass index is 24.9 kg/m.  Estimated Nutritional Needs:   Kcal:  1600-1800  Protein:  85-95 grams  Fluid:  1.6L/day  Tilda Franco, MS, RD, LDN Inpatient Clinical Dietitian Contact information available via Amion

## 2022-12-18 NOTE — Progress Notes (Signed)
PMT no charge note.   Chart reviewed, PMT following peripherally, I have asked chaplain services for additional support for daughter as well. Monitor hospital course, pressors are being weaned down and renal function is being tracked. Multiple detailed goals of care conversations were held by this writer with the patient's family earlier in the course of her hospitalization including the patient's only child, daughter [name].  She is aware of scope of palliative services.  We are going to follow her hospital course and remain available for further discussions. No charge Rosalin Hawking, MD [place] [facility]

## 2022-12-18 NOTE — Progress Notes (Signed)
PHARMACY NOTE -  Zosyn  Pharmacy has been assisting with dosing of Zosyn for aspiration PNA. Dosage remains stable at 3.375 g IV q8 hr and further renal adjustments per institutional Pharmacy antibiotic protocol  Pharmacy will sign off, following peripherally for culture results, dose adjustments, and length of therapy. Please reconsult if a change in clinical status warrants re-evaluation of dosage.  Bernadene Person, PharmD, BCPS [phone] 12/18/2022, 12:42 PM

## 2022-12-19 ENCOUNTER — Inpatient Hospital Stay (HOSPITAL_COMMUNITY): Payer: Managed Care, Other (non HMO)

## 2022-12-19 ENCOUNTER — Other Ambulatory Visit: Payer: Self-pay

## 2022-12-19 LAB — CBC WITH DIFFERENTIAL/PLATELET
Abs Immature Granulocytes: 0.6 10*3/uL — ABNORMAL HIGH (ref 0.00–0.07)
Basophils Absolute: 0.1 10*3/uL (ref 0.0–0.1)
Basophils Relative: 0 %
Eosinophils Absolute: 0 10*3/uL (ref 0.0–0.5)
Eosinophils Relative: 0 %
HCT: 24 % — ABNORMAL LOW (ref 36.0–46.0)
Hemoglobin: 8 g/dL — ABNORMAL LOW (ref 12.0–15.0)
Immature Granulocytes: 3 %
Lymphocytes Relative: 4 %
Lymphs Abs: 0.8 10*3/uL (ref 0.7–4.0)
MCH: 27.7 pg (ref 26.0–34.0)
MCHC: 33.3 g/dL (ref 30.0–36.0)
MCV: 83 fL (ref 80.0–100.0)
Monocytes Absolute: 0.3 10*3/uL (ref 0.1–1.0)
Monocytes Relative: 2 %
Neutro Abs: 18 10*3/uL — ABNORMAL HIGH (ref 1.7–7.7)
Neutrophils Relative %: 91 %
Platelets: 71 10*3/uL — ABNORMAL LOW (ref 150–400)
RBC: 2.89 MIL/uL — ABNORMAL LOW (ref 3.87–5.11)
RDW: 19.9 % — ABNORMAL HIGH (ref 11.5–15.5)
WBC: 19.8 10*3/uL — ABNORMAL HIGH (ref 4.0–10.5)
nRBC: 0.1 % (ref 0.0–0.2)

## 2022-12-19 LAB — TRIGLYCERIDES: Triglycerides: 168 mg/dL — ABNORMAL HIGH (ref <150)

## 2022-12-19 LAB — MRSA NEXT GEN BY PCR, NASAL: MRSA by PCR Next Gen: NOT DETECTED

## 2022-12-19 LAB — COMPREHENSIVE METABOLIC PANEL
ALT: 41 U/L (ref 0–44)
AST: 42 U/L — ABNORMAL HIGH (ref 15–41)
Albumin: <1.5 g/dL — ABNORMAL LOW (ref 3.5–5.0)
Alkaline Phosphatase: 87 U/L (ref 38–126)
Anion gap: 10 (ref 5–15)
BUN: 39 mg/dL — ABNORMAL HIGH (ref 6–20)
CO2: 23 mmol/L (ref 22–32)
Calcium: 7.6 mg/dL — ABNORMAL LOW (ref 8.9–10.3)
Chloride: 107 mmol/L (ref 98–111)
Creatinine, Ser: 1.71 mg/dL — ABNORMAL HIGH (ref 0.44–1.00)
GFR, Estimated: 37 mL/min — ABNORMAL LOW (ref >60)
Glucose, Bld: 302 mg/dL — ABNORMAL HIGH (ref 70–99)
Potassium: 3.6 mmol/L (ref 3.5–5.1)
Sodium: 140 mmol/L (ref 135–145)
Total Bilirubin: 1 mg/dL (ref 0.3–1.2)
Total Protein: 4 g/dL — ABNORMAL LOW (ref 6.5–8.1)

## 2022-12-19 LAB — GLUCOSE, CAPILLARY
Glucose-Capillary: 147 mg/dL — ABNORMAL HIGH (ref 70–99)
Glucose-Capillary: 149 mg/dL — ABNORMAL HIGH (ref 70–99)
Glucose-Capillary: 118 mg/dL — ABNORMAL HIGH (ref 70–99)
Glucose-Capillary: 139 mg/dL — ABNORMAL HIGH (ref 70–99)
Glucose-Capillary: 147 mg/dL — ABNORMAL HIGH (ref 70–99)

## 2022-12-19 LAB — MAGNESIUM
Magnesium: 1.8 mg/dL (ref 1.7–2.4)
Magnesium: 1.9 mg/dL (ref 1.7–2.4)

## 2022-12-19 LAB — PHOSPHORUS
Phosphorus: 3.4 mg/dL (ref 2.5–4.6)
Phosphorus: 3.2 mg/dL (ref 2.5–4.6)

## 2022-12-19 LAB — LIPASE, BLOOD: Lipase: 208 U/L — ABNORMAL HIGH (ref 11–51)

## 2022-12-19 MED ORDER — ORAL CARE MOUTH RINSE: 15.0000 mL | OROMUCOSAL | Status: DC | PRN

## 2022-12-19 MED ORDER — INSULIN ASPART 100 UNIT/ML IJ SOLN
0.0000 [IU] | INTRAMUSCULAR | Status: DC
  Administered 2022-12-19 – 2022-12-20 (×3): 1 [IU] via SUBCUTANEOUS

## 2022-12-19 MED ORDER — OXYCODONE HCL 5 MG PO TABS
2.5000 mg | ORAL_TABLET | ORAL | Status: DC | PRN
  Administered 2022-12-19 – 2022-12-22 (×6): 5 mg
  Filled 2022-12-19 (×5): qty 1

## 2022-12-19 MED ORDER — ALBUTEROL SULFATE (2.5 MG/3ML) 0.083% IN NEBU
2.5000 mg | INHALATION_SOLUTION | RESPIRATORY_TRACT | Status: DC
  Administered 2022-12-20 (×2): 2.5 mg via RESPIRATORY_TRACT
  Filled 2022-12-19 (×4): qty 3

## 2022-12-19 MED ORDER — HEPARIN (PORCINE) 25000 UT/250ML-% IV SOLN
1200.0000 [IU]/h | INTRAVENOUS | Status: DC
  Administered 2022-12-19: 850 [IU]/h via INTRAVENOUS
  Administered 2022-12-20: 1200 [IU]/h via INTRAVENOUS
  Filled 2022-12-19 (×2): qty 250

## 2022-12-19 MED ORDER — ACETAMINOPHEN 10 MG/ML IV SOLN
1000.0000 mg | Freq: Once | INTRAVENOUS | Status: AC
  Administered 2022-12-19: 1000 mg via INTRAVENOUS
  Filled 2022-12-19: qty 100

## 2022-12-19 MED ORDER — POTASSIUM CHLORIDE 10 MEQ/50ML IV SOLN
10.0000 meq | INTRAVENOUS | Status: AC
  Administered 2022-12-19 (×4): 10 meq via INTRAVENOUS
  Filled 2022-12-19 (×4): qty 50

## 2022-12-19 MED ORDER — HYDROMORPHONE HCL 1 MG/ML IJ SOLN
1.0000 mg | Freq: Once | INTRAMUSCULAR | Status: AC
  Administered 2022-12-19: 1 mg via INTRAVENOUS
  Filled 2022-12-19: qty 1

## 2022-12-19 MED ORDER — LABETALOL HCL 5 MG/ML IV SOLN: 10.0000 mg | INTRAVENOUS | Status: DC | PRN

## 2022-12-19 MED ORDER — VITAL AF 1.2 CAL PO LIQD
1000.0000 mL | ORAL | Status: DC
  Administered 2022-12-19 – 2022-12-20 (×2): 1000 mL

## 2022-12-19 MED ORDER — TRAVASOL 10 % IV SOLN
INTRAVENOUS | Status: AC
  Filled 2022-12-19: qty 940.8

## 2022-12-19 MED ORDER — VITAL HIGH PROTEIN PO LIQD: 1000.0000 mL | ORAL | Status: DC

## 2022-12-19 MED ORDER — IOHEXOL 350 MG/ML SOLN
75.0000 mL | Freq: Once | INTRAVENOUS | Status: AC | PRN
  Administered 2022-12-19: 75 mL via INTRAVENOUS

## 2022-12-19 NOTE — Progress Notes (Signed)
Nutrition Follow-up  DOCUMENTATION CODES:   Non-severe (moderate) malnutrition in context of chronic illness  INTERVENTION:  - Per pharmacy, increasing to goal TPN of 9mL/hr tonight. Provides 94g protein and 1737 kcals.  - TPN management per Pharmacy.  - Starting trickle tube feeds today: Vital 1.2 at 56mL/hr  - Monitor magnesium, potassium, and phosphorus for at least 3 days, MD to replete as needed, as pt is at risk for refeeding syndrome given malnutrition with severe weight loss over the past 4 months. - Continue  100mg  thiamine for at least 5 days.  - Daily weights while on TPN  - Will monitor for diet advancement.  - Concern for malabsorption issues as patient documented to only have ~100cm small bowel remaining. Suspect patient will need to be TPN dependent long term.    NUTRITION DIAGNOSIS:   Moderate Malnutrition related to chronic illness (Crohn's disease; metastatic breast cancer) as evidenced by moderate fat depletion, moderate muscle depletion, percent weight loss (24% in 4.5 months). *ongoing  GOAL:   Patient will meet greater than or equal to 90% of their needs *met with TPN  MONITOR:   Vent status, Labs, Weight trends, I & O's  REASON FOR ASSESSMENT:   Ventilator    ASSESSMENT:   47 y.o. female with PMH of recurrent breast cancer not currently on treatment, Crohn's disease s/p subtotal colectomy in 2021, recurrent obstructions, colonic stricture, recent hospitalization 8/10-8/15/2024 for obstruction who presented on 12/12/2022 with abdominal pain, nausea, vomiting diarrhea.  9/4 Admit; found down in ED bathroom with no pulse, intubated 9/5 s/p ex-lap, found to have internal hernia with small bowel ischemia, only ~100cm of small bowel remaining 9/7 ex-lap, I&D, end ileostomy 9/8 TPN started at 71mL/hr 9/9 TPN increased to 41mL/hr  Patient extubated this AM. NGT removed during extubation but RN reports tube was replaced. New xray this AM confirms  placement.   Per Surgery, can start trickle tube feeds today. Plan to not advance and to monitor ostomy output.   She remains on TPN. Per Pharmacy, plan to increase to goal of 30mL/hr this evening.  Patient remains at high risk of refeeding syndrome, monitor electrolytes closely and continue thiamine.     Medications reviewed and include: 100mg  thiamine (x5 days)  Labs reviewed:  Creatinine 1.71 HA1C 5.7 Blood Glucose 147-176 x24 hours Triglycerides 168 (9/10)   Diet Order:   Diet Order             Diet NPO time specified  Diet effective now                   EDUCATION NEEDS:  Education needs have been addressed  Skin:  Skin Assessment: Skin Integrity Issues: Skin Integrity Issues:: Incisions Incisions: Abdomen  Last BM:  9/10 - ileostomy  Height:  Ht Readings from Last 1 Encounters:  12/13/22 5\' 4"  (1.626 m)   Weight:  Wt Readings from Last 1 Encounters:  12/19/22 63.7 kg    BMI:  Body mass index is 24.11 kg/m.  Estimated Nutritional Needs:  Kcal:  1600-1800 Protein:  85-95 grams Fluid:  1.6L/day    Shelle Iron RD, LDN For contact information, refer to [facility].

## 2022-12-19 NOTE — Progress Notes (Signed)
Pt NT suction down her right nare for a small amount of thick tan/white secretions.  No desaturation noted.  Pt was hyper-oxygenated on 15l nrb prior to and between suctioning.  RN present in room.

## 2022-12-19 NOTE — Consult Note (Signed)
WOC consult received for new ileostomy placed by Dr. Hillery Hunter 12/16/2022.   WOC team will follow for ostomy education and support.   Thank you,    Priscella Mann MSN, RN-BC, [employer] [phone]

## 2022-12-19 NOTE — Plan of Care (Signed)
Patient maintaining blood pressure without intervention and currently on lowest dose sedation with least pain medication requirements. Continuing to monitor.   Problem: Education: Goal: Ability to describe self-care measures that may prevent or decrease complications (Diabetes Survival Skills Education) will improve Outcome: Progressing Goal: Individualized Educational Video(s) Outcome: Progressing   Problem: Coping: Goal: Ability to adjust to condition or change in health will improve Outcome: Progressing   Problem: Fluid Volume: Goal: Ability to maintain a balanced intake and output will improve Outcome: Progressing   Problem: Health Behavior/Discharge Planning: Goal: Ability to identify and utilize available resources and services will improve Outcome: Progressing Goal: Ability to manage health-related needs will improve Outcome: Progressing   Problem: Metabolic: Goal: Ability to maintain appropriate glucose levels will improve Outcome: Progressing   Problem: Nutritional: Goal: Maintenance of adequate nutrition will improve Outcome: Progressing Goal: Progress toward achieving an optimal weight will improve Outcome: Progressing   Problem: Skin Integrity: Goal: Risk for impaired skin integrity will decrease Outcome: Progressing   Problem: Tissue Perfusion: Goal: Adequacy of tissue perfusion will improve Outcome: Progressing   Problem: Education: Goal: Knowledge of General Education information will improve Description: Including pain rating scale, medication(s)/side effects and non-pharmacologic comfort measures Outcome: Progressing   Problem: Health Behavior/Discharge Planning: Goal: Ability to manage health-related needs will improve Outcome: Progressing   Problem: Clinical Measurements: Goal: Ability to maintain clinical measurements within normal limits will improve Outcome: Progressing Goal: Will remain free from infection Outcome: Progressing Goal:  Diagnostic test results will improve Outcome: Progressing Goal: Respiratory complications will improve Outcome: Progressing Goal: Cardiovascular complication will be avoided Outcome: Progressing   Problem: Activity: Goal: Risk for activity intolerance will decrease Outcome: Progressing   Problem: Nutrition: Goal: Adequate nutrition will be maintained Outcome: Progressing   Problem: Coping: Goal: Level of anxiety will decrease Outcome: Progressing   Problem: Elimination: Goal: Will not experience complications related to bowel motility Outcome: Progressing Goal: Will not experience complications related to urinary retention Outcome: Progressing   Problem: Pain Managment: Goal: General experience of comfort will improve Outcome: Progressing   Problem: Safety: Goal: Ability to remain free from injury will improve Outcome: Progressing   Problem: Skin Integrity: Goal: Risk for impaired skin integrity will decrease Outcome: Progressing

## 2022-12-19 NOTE — Progress Notes (Signed)
Pt NTS for thick moderate secretions via her right nare.  Pt tolerated well.  No desaturation noted.  Pt remains on 4L [place]

## 2022-12-19 NOTE — Progress Notes (Signed)
PHARMACY - TOTAL PARENTERAL NUTRITION CONSULT NOTE   Indication: Short bowel syndrome  Patient Measurements: Height: 5\' 4"  (162.6 cm) Weight: 63.7 kg (140 lb 6.9 oz) IBW/kg (Calculated) : 54.7   Body mass index is 24.11 kg/m. Usual Weight: 46.3 kg from 8/28  Assessment: 47 y.o. female with PMH of recurrent breast cancer not currently on treatment, Crohn's disease s/p subtotal colectomy in 2021, recurrent obstructions, colonic stricture, recent hospitalization 8/10-8/15/2024 for obstruction who presented on 12/12/2022 with abdominal pain, nausea, vomiting diarrhea.    9/4 Admit; found down in ED bathroom with no pulse, CPR > intubated 9/5 s/p ex-lap, found to have internal hernia with small bowel ischemia, only 105 cm of small bowel remaining OR 9/7 for closure, ostomy creation 9/8 pharmacy consulted to manage TPN  Glucose / Insulin: no Hx DM - steroids PTA for Crohn's disease, tapering SoluCortef to 50 q24 today - also on dextrose-containing IVF at high rate - CBGs up slightly after advancing TPN to goal rate; mostly in 150-175 range, but SBGs oddly reading much higher - 8 units SSI given yesterday Electrolytes: K borderline low and unchanged despite 40 mEq yesterday; phos improved to WNL; bicarb normalized after adjusting TPN; others stable WNL Renal: AKI slowly resolving (baseline < 1.0); BUN elevated & rising; UOP robust after Lasix 40 mg IV x 1 yesterday  Hepatic: LFTs/Bili now normalized after post-arrest insult - albumin low on admission and now remains < 1.5 - TG slightly elevated today after starting propofol I/O:  - Charting shows +21L since admission (weight did increase by 7 kg since admit - this more likely represents actual net fluid gain) - increasing NG output - having ostomy output (thin liquid as expected from ileostomy) - mIVF: NS @ 50 GI Imaging: none GI Surgeries / Procedures:  - OR 9/5 ex lap, internal hernia w/ small bowel ischemia, SBR & celiotomy; > 105 cm  small bowel remaining; - OR 9/7 for closure, ostomy creation Central access: triple lumen CVC R subclavian 9/4, triple lumen CVC R femoral 9/4;  PAC 05/02/22 TPN start date: 12/17/22  Nutritional Goals: - started on propofol 9/9; current rate of 3 ml/hr = ~80 kcal/day from lipids - Updated RD recs: new goal TPN formulation at 70 mL/hr provides 95 g protein and 1737 kcal per day  RD Assessment: Estimated Needs Total Energy Estimated Needs: 1600-1800 Total Protein Estimated Needs: 85-95 grams Total Fluid Estimated Needs: 1.6L/day  Current Nutrition:  NPO  Plan:  KCl 10 mEq IV x 4   Increase TPN to 70 mL/hr at 1800 Electrolytes in TPN: increase K, Mg slightly Na - 50 mEq/L K - 50 >> 65 mEq/L Ca - 5 mEq/L Mg - 5 >> 8 mEq/L Phos - 15 mmol/L Cl:Ac ratio - max Ac Add standard MVI and trace elements to TPN Receiving thiamine 100 mg x 5 days outside of TPN per RD recs CCM increasing sensitive SSI back to q4 hr monitoring Continue mIVF at 50 ml/hr; further adjustments per MD/CCM based on fluid status Monitor TPN labs on Mon/Thurs BMP, TG tomorrow Checking Mg/Phos bid x 3 days per RD recommendations for refeeding risk  Bernadene Person, PharmD, BCPS [phone] 12/19/2022, 8:49 AM

## 2022-12-19 NOTE — Progress Notes (Signed)
NAME:  [name], MRN:  [id number], DOB:  20-Sep-1975, LOS: 6 ADMISSION DATE:  12/12/2022, CONSULTATION DATE:  12/13/22 REFERRING MD:  Rubin Payor, MD CHIEF COMPLAINT:  cardiac arrest    History of Present Illness:  47 year old female with past medical history of recurrent breast cancer not currently on treatment, Crohn's disease s/p subtotal colectomy in 2021 recurrent obstructions, colonic stricture, IDA, recent hospitalization 8/10-8/15/2024 for obstruction seen by GI and general surgery. Underwent EGD and flexible sigmoidoscopy and discharged on steroids who presented to the emergency department on 12/12/2022 with abdominal pain, nausea, vomiting diarrhea. In the ED tachycardic, hypertensive. ED provider notes abdominal distention. She was given 2L IVF, morphine, IV PPI. Labs with WBC 10.8, Hgb 10.7, K 3.2. CT AP was ordered and showed markedly dilated distal small bowel consistent with chronic obstruction. No changes since prior study. Patient was admitted to [facility]. They consulted GI for input. Unfortunately, around 0820 this morning patient was found on the floor in the bathroom in ED pulseless. They estimate downtime around 5 minutes. ACLS was initiated. Concern for possible torsades de pointes and given IV mag, defibrillated. Coded for around 5 minutes. She was intubated. She had copious vomitus in the ETT. ROSC achieved and ICU was consulted. Notably, she had I-stat peri-code which showed hemoglobin of 5.5 down from 10 overnight.   Pertinent  Medical History  Crohn's disease with previous bowel obstruction s/p subtotal colectomy 2021, colonic stricture, IDA, breast cancer s/p mastectomy 2022  Significant Hospital Events: Including procedures, antibiotic start and stop dates in addition to other pertinent events   9/3: admitted to [facility] for abdominal distention, presumed Crohn's exacerbation 9/4: ~0820 cardiac arrest in ED. Admitted to ICU intubated, sedated. Hgb 5.5. Placed right femoral CVC.  Received 3 UPRBC, 3 FFP. 9/5: - Hgb stable at 11.7 after 3u PRBC, 3 FPP 9/5 overnight increasing pressor requirements - added vaso. Bladder pressures 28, went for exploratory lap w/ sb resxn, and celiotomy, intra op findings: internal hernia w/ associated small bowel ischemia. Also seen by GI and palliative. Full scope of care desired  9/6 some ST changes in lateral leads. Weaning pressors. Removing femoral line and placing internal jugular. Renal fxn worse, on-going metabolic acidosis.  9/7: back to OR for ostomy placement.  9/8: off levo 9/9: weaning sedation. Following commands. Tried PS of 5/5 which Vt did not hold up. Giving IV lasix for anasarca, she's 20+ L up on admission. Will try SBT again this afternoon.  Interim History / Subjective:  Intubated, mildly sedated but following my commands  Objective   Blood pressure 134/73, pulse (!) 113, temperature 99.3 F (37.4 C), temperature source Bladder, resp. rate (!) 23, height 5\' 4"  (1.626 m), weight 63.7 kg, last menstrual period 04/24/2021, SpO2 100%. CVP:  [3 mmHg-6 mmHg] 6 mmHg  Vent Mode: PRVC FiO2 (%):  [28 %] 28 % Set Rate:  [16 bmp] 16 bmp Vt Set:  [440 mL] 440 mL PEEP:  [5 cmH20] 5 cmH20 Pressure Support:  [15 cmH20] 15 cmH20 Plateau Pressure:  [17 cmH20-24 cmH20] 17 cmH20   Intake/Output Summary (Last 24 hours) at 12/19/2022 0805 Last data filed at 12/19/2022 0800 Gross per 24 hour  Intake 3631.46 ml  Output 3550 ml  Net 81.46 ml   Filed Weights   12/16/22 0500 12/17/22 0419 12/19/22 0500  Weight: 62.3 kg 65.8 kg 63.7 kg    Examination: General: Female in bed intubated, wakes up to voice.  HENT: ETT in place, NGT in place Pulm:  rhonchi bilaterally, vented on minimal vent settings Card: S1/S2 without murmur, rub, gallop Abd: Postsurgical site is clean, dry and intact.  Ostomy in the left abdomen is pink, moist without dusky appearance.  There is a small amount of output. Extremities: edematous  Neuro: Sedated on  ventilator. Opens eyes spontaneously and to voice. Intermittently nods to questions  Resolved Hospital Problem list   Cardiac arrest  Shock Shock liver Bradycardia  Assessment & Plan:  Sepsis: Likely secondary to bacterial translocation from ischemic gut.  Levophed off 12/17/2022. -Continue Zosyn -taper steroids to daily solu-cortef   Small bowel obstruction secondary to internal hernia and resultant ischemic gut status post exploratory laparotomy, status post ostomy placement.  Tolerated surgery well yesterday.  Surgical site appears clean, dry, and intact. Now having output -Appreciate surgery recommendations  -Keep NGT when eventually extubated per surgery  -Continue TPN, appreciate dietary input -Entyvio on hold  Acute hypoxemic respiratory failure secondary cardiac arrest aspiration; on minimal vent settings. Weaning sedation for SBT. 9/9: Did attempt PS of 5/5, SBI borderline  -Wake up today. Try PS again and possibly extubate  -RASS Goal 0 to -1 -Ventilator associated pneumonia prevention protocol  Elevated Lipase; unlikely propofol related. She did have low grade temp yesterday.  -trend   AKI secondary to sepsis and endorgan hypoperfusion -Maintain renal perfusion -Trend electrolytes -Avoid nephrotoxic medications  Acute on chronic anemia -Continue to monitor -Transfuse per protocol  Thrombocytopenia -Likely related to acute illness -Continue to trend  ST elevation in the lateral leads -Remains stable -No new changes on EKG  Hyperglycemia -Continue SSI  Recurrent breast cancer not currently on treatment; per Dr. Pamelia Hoit note 10/11/22, patient has declined further treatment at this time.  -follow-up in September to discuss anti-estrogen therapy.   Best Practice (right click and "Reselect all SmartList Selections" daily)   Diet/type: TPN DVT prophylaxis: SCD  GI prophylaxis: PPI Lines: Central line and yes and it is still needed Foley:  Yes, and it is still  needed Code Status:  full code Last date of multidisciplinary goals of care discussion: Answered all questions for family at bedside at this time.  Critical care time: 30

## 2022-12-19 NOTE — Progress Notes (Signed)
[facility] Progress Note  3 Days Post-Op  Subjective: CC-  On the vent. Remains off pressors. Ileostomy with 525cc out.  Objective: Vital signs in last 24 hours: Temp:  [98.4 F (36.9 C)-100 F (37.8 C)] 99.3 F (37.4 C) (09/10 0800) Pulse Rate:  [74-128] 113 (09/10 0800) Resp:  [15-27] 23 (09/10 0800) BP: (96-167)/(54-104) 134/73 (09/10 0800) SpO2:  [93 %-100 %] 100 % (09/10 0800) Arterial Line BP: (130-136)/(64-69) 130/64 (09/09 1000) FiO2 (%):  [28 %] 28 % (09/10 0800) Weight:  [63.7 kg] 63.7 kg (09/10 0500) Last BM Date : 12/19/22  Intake/Output from previous day: 09/09 0701 - 09/10 0700 In: 5106.4 [I.V.:4269.5; NG/GT:90; IV Piggyback:746.8] Out: 3600 [Urine:2775; Emesis/NG output:300; Stool:525] Intake/Output this shift: Total I/O In: 265.2 [I.V.:240.3; IV Piggyback:24.9] Out: -   PE: Gen:  sedated on the vent HEENT: ETT and NG present Card:  RRR Pulm:  mechanically ventilated Abd: soft, ND, open midline wound pale pink without erythema or drainage, ostomy pink and viable with thin liquid stool in bag  Lab Results:  Recent Labs    12/18/22 1000 12/19/22 0502  WBC 20.0* 19.8*  HGB 8.2* 8.0*  HCT 23.9* 24.0*  PLT 52* 71*   BMET Recent Labs    12/18/22 0705 12/19/22 0502  NA 139 140  K 3.6 3.6  CL 109 107  CO2 21* 23  GLUCOSE 193* 302*  BUN 31* 39*  CREATININE 1.80* 1.71*  CALCIUM 7.4* 7.6*   PT/INR No results for input(s): "LABPROT", "INR" in the last 72 hours. CMP     Component Value Date/Time   NA 140 12/19/2022 0502   NA 135 01/21/2020 1056   K 3.6 12/19/2022 0502   CL 107 12/19/2022 0502   CO2 23 12/19/2022 0502   GLUCOSE 302 (H) 12/19/2022 0502   BUN 39 (H) 12/19/2022 0502   BUN 12 01/21/2020 1056   CREATININE 1.71 (H) 12/19/2022 0502   CREATININE 0.91 09/21/2022 1141   CALCIUM 7.6 (L) 12/19/2022 0502   PROT 4.0 (L) 12/19/2022 0502   ALBUMIN <1.5 (L) 12/19/2022 0502   AST 42 (H) 12/19/2022 0502   AST 18  09/21/2022 1141   ALT 41 12/19/2022 0502   ALT 12 09/21/2022 1141   ALKPHOS 87 12/19/2022 0502   BILITOT 1.0 12/19/2022 0502   BILITOT 0.4 09/21/2022 1141   GFRNONAA 37 (L) 12/19/2022 0502   GFRNONAA >60 09/21/2022 1141   GFRAA 108 01/21/2020 1056   Lipase     Component Value Date/Time   LIPASE 208 (H) 12/19/2022 0502       Studies/Results: No results found.  Anti-infectives: Anti-infectives (From admission, onward)    Start     Dose/Rate Route Frequency Ordered Stop   12/13/22 1000  piperacillin-tazobactam (ZOSYN) IVPB 3.375 g        3.375 g 12.5 mL/hr over 240 Minutes Intravenous Every 8 hours 12/13/22 0981          Assessment/Plan Hx Crohn's disease s/p subtotal colectomy  Internal hernia with bowel ischemia -S/p exploratory laparotomy with small bowel resection 9/5 Dr. Hillery Hunter -POD#3 s/p exploratory laparotomy, ileostomy, abdominal closure 9/7 Dr. Hillery Hunter - approximately 100cc small bowel left and will almost definitely be TPN dependent - Ok to start enteric feeds/PO meds as she has remained off pressors. Continue TPN - Midline incision care: WTD BID - WOC consult for new ostomy  ID - zosyn FEN - NPO/NGT, TPN VTE - SCDs only due to thrombocytopenia Foley - present  --per CCM--  Shock - off pressors VDRF Recent cardiac arrest 9/3 AKI - improving Anemia Thrombocytopenia Hx metastatic breast cancer   LOS: 6 days    Franne Forts, [facility] Surgery 12/19/2022, 8:06 AM Please see Amion for pager number during day hours 7:00am-4:30pm

## 2022-12-19 NOTE — Progress Notes (Signed)
Chaplain spoke with [name]'s daughter briefly.  She stated that they are doing okay and declined to speak further at this time.  [address] [place] Pager, [phone]

## 2022-12-19 NOTE — Progress Notes (Addendum)
Afternoon rounds: Extubated today to [facility]. Very weak. Weak cough. Has upper airway congestion which is difficult to clear. She has been mildly tachypneic but subjectively does not feel short of breath. Oxygenating well on [place]. Given congestion, atelectasis on imaging added on albuterol nebs, NTS, flutter valve to try and optimize her. Do not feel BiPAP would be beneficial for her. Worry about mucous plugging and she is too weak to remove BiPAP herself if needed right now.   Tachycardia. Similar to yesterday she is up to the 140s intermittently, settles around 120-125. Mildly hypertensive. Denying anxiety. Doesn't appear anxious. She is mildly tachypneic but does not feel subjectively short of breath. We went ahead with CTAPE study which showed: IMPRESSION: 1. Positive for segmental PE in the left lower lobe pulmonary artery branches. 2. Moderate to large right and small to moderate left pleural effusions with associated bibasilar atelectasis. 3. Greater than expected consolidative changes in the bilateral lower lobes than expected for simple atelectasis. Superimposed small volume aspiration or pneumonia is suspected.    Ordered echo, bilateral venous duplex to reassess for DVT.  Called gen surg given high risk bleeder. Spoke with Dr. Doylene Canard. Okay with heparin gtt without initial bolus. This was ordered. After conversation at bedside with her she will accept PO oxy intermittently along with scheduled tylenol for pain. Will see if this improves vitals as well.   Started on trickle tube feed per surgery. Still on TPN.

## 2022-12-19 NOTE — Procedures (Signed)
Extubation Procedure Note  Patient Details:   Name: [name] DOB: [birth year] MRN: [id number]   Airway Documentation:  Airway 7.5 mm (Active)  Secured at (cm) 20 cm 12/19/22 0829  Measured From Lips 12/19/22 0829  Secured Location Left 12/19/22 0829  Secured By [employer] 12/19/22 0829  Tube Holder Repositioned Yes 12/19/22 0829  Prone position No 12/19/22 0829  Head position Right 12/17/22 1609  Cuff Pressure (cm H2O) Green OR 18-26 [facility] 12/19/22 0829  Site Condition Dry 12/18/22 2328   Vent end date: (not recorded) Vent end time: (not recorded)   Evaluation  O2 sats: stable throughout Complications: No apparent complications Patient did tolerate procedure well. Bilateral Breath Sounds: Rhonchi, Diminished   Yes  Pt placed on 3L [place].  PT tolerated well, PT with weak cough.  PT sucitoned prior to extubation. HR 125, RR 22, sats 99% on 3l [place].      Cain Sieve 12/19/2022, 10:37 AM

## 2022-12-19 NOTE — Consult Note (Addendum)
WOC Nurse ostomy consult note; new ileostomy 12/16/2022  Stoma type/location: LMQ ileostomy  Stomal assessment/size: 1 3/8", round, pink moist, edematous,  well budded  Peristomal assessment: intact, open midline incision to right of ostomy, trimmed barrier to keep away from the wound  Treatment options for stomal/peristomal skin: 2" barrier ring  Output approximately 100 mls brown liquid effluent  Ostomy pouching: 2 piece 2 1/4" skin barrier [place] #[id number], 2 1/4" high output pouch [place] [phone] Education provided: Patient remains critically ill in ICU setting and not ready for education.  Daughter Trinity in room and was agreeable to teaching session. Current pouch is lifting at edge that is closest to abdominal wound.   Educated daughter on emptying pouch when 1/3 to 1/2 full. Daughter was able to return demonstration of opening pouch using lock and roll mechanism, emptying pouch, cleaning spout with toilet paper wick and closing pouch.  Discussed with daughter the need to change entire pouching system twice weekly or if leaking.  We removed the current pouch. Patient having copious liquid output from stoma as attempting to pouch. Discussed sizing stoma with each pouch change for first month, as edema subsides stoma size may change.  We sized stoma today at 1 3/8" round.  Cleaned around stoma with water moistened washcloth  and discussed rationale with daughter.  I cut new skin barrier at 1 3/8".  Demonstrated stretching 2" barrier ring around stoma.  Daughter removed plastic backing off new skin barrier and we placed around stoma.  Daughter was able to snap high output pouch onto skin barrier.    Reviewed emptying pouch in toilet when at home. Reviewed all educational materials with daughter including hand out with step by step instructions on changing a 2 piece pouching system and educational videos.    Will hold off on secure start now to see if this pouching system will be  best for patient.    Enrolled patient in [employer] DC program: No not yet   WOC team will continue to follow for ostomy education and support.   Thank you,    Priscella Mann MSN, RN-BC, [employer] [phone]

## 2022-12-19 NOTE — Progress Notes (Signed)
ANTICOAGULATION CONSULT NOTE - Initial Consult  Pharmacy Consult for heparin  Indication: pulmonary embolus  Allergies  Allergen Reactions   Nsaids Other (See Comments)    Crohns disease/ IBD    Patient Measurements: Height: 5\' 4"  (162.6 cm) Weight: 63.7 kg (140 lb 6.9 oz) IBW/kg (Calculated) : 54.7 Heparin Dosing Weight: 46.4 kg (admit wt on 8/28)  Vital Signs: Temp: 99 F (37.2 C) (09/10 1300) Temp Source: Bladder (09/10 1204) BP: 169/91 (09/10 1300) Pulse Rate: 124 (09/10 1300)  Labs: Recent Labs    12/17/22 1003 12/17/22 1729 12/18/22 0705 12/18/22 1000 12/19/22 0502  HGB 8.9*  --   --  8.2* 8.0*  HCT 25.8*  --   --  23.9* 24.0*  PLT 56*  --   --  52* 71*  CREATININE  --  1.86* 1.80*  --  1.71*    Estimated Creatinine Clearance: 35.9 mL/min (A) (by C-G formula based on SCr of 1.71 mg/dL (H)).   Medical History: Past Medical History:  Diagnosis Date   Breast cancer (HCC)    right   Colon stricture (HCC) 05/07/2019   Crohn's colitis, with intestinal obstruction (HCC) 09/17/2013   Dx 2006 - right and left colon involved 2011 - pan-colitis 09/17/2013 left colitis 60-20 cm March 2021 subtotal colectomy ileosigmoid anastomosis for colonic strictures-Dr. Maisie Fus     Crohn's disease [facility])    Family history of breast cancer 06/11/2020   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 10/17/2010   Rectal bleeding    Uterine fibroid    Vitamin D deficiency 09/24/2013    Medications:  Medications Prior to Admission  Medication Sig Dispense Refill Last Dose   acetaminophen (TYLENOL) 500 MG tablet Take 500-1,000 mg by mouth every 6 (six) hours as needed for mild pain.   12/12/2022   cholecalciferol (VITAMIN D3) 25 MCG (1000 UNIT) tablet Take 1,000 Units by mouth daily.   12/12/2022   hyoscyamine (LEVSIN SL) 0.125 MG SL tablet Place 1 tablet (0.125 mg total) under the tongue every 6 (six) hours as needed for cramping. 30 tablet 0 unknown    Multiple Vitamins-Minerals (MULTIVITAMIN WITH MINERALS) tablet Take 1 tablet by mouth daily.   Past Week   nystatin (MYCOSTATIN) 100000 UNIT/ML suspension Take 5 mLs (500,000 Units total) by mouth 4 (four) times daily. 473 mL 0 Past Month   potassium chloride SA (KLOR-CON M) 20 MEQ tablet Take 1 tablet (20 mEq total) by mouth daily. 90 tablet 2 12/12/2022   predniSONE (DELTASONE) 20 MG tablet Take 2 tablets (40 mg total) by mouth daily with breakfast for 7 days, THEN 1.5 tablets (30 mg total) daily with breakfast for 7 days, THEN 1 tablet (20 mg total) daily with breakfast for 21 days. 45.5 tablet 0 12/12/2022   simethicone (MYLICON) 80 MG chewable tablet Chew 1 tablet (80 mg total) by mouth 4 (four) times daily as needed for flatulence. 30 tablet 0 unknown    Assessment: Pharmacy consulted to dose heparin for new LLL PE.   OR 9/5 s/p ex lap w/ SBR OR 9/7 OR ex lap, ileostomy, abd closure Per CCS OK to start heparin with no bolus.  Hg 8; PLT 71; SCr 1.71  Goal of Therapy:  Heparin level 0.3-0.7 units/ml Monitor platelets by anticoagulation protocol: Yes   Plan:  No bolus per MD Start heparin at 850 units/hr and check 8 hour heparin level Daily CBC & heparin level  Herby Abraham, Pharm.D Use secure chat for  questions 12/19/2022 5:21 PM

## 2022-12-20 ENCOUNTER — Inpatient Hospital Stay (HOSPITAL_COMMUNITY): Payer: Managed Care, Other (non HMO)

## 2022-12-20 ENCOUNTER — Encounter (HOSPITAL_COMMUNITY): Payer: Self-pay | Admitting: Internal Medicine

## 2022-12-20 DIAGNOSIS — I2609 Other pulmonary embolism with acute cor pulmonale: Secondary | ICD-10-CM | POA: Diagnosis not present

## 2022-12-20 DIAGNOSIS — K90829 Short bowel syndrome, unspecified: Secondary | ICD-10-CM

## 2022-12-20 DIAGNOSIS — Z934 Other artificial openings of gastrointestinal tract status: Secondary | ICD-10-CM

## 2022-12-20 DIAGNOSIS — Z86711 Personal history of pulmonary embolism: Secondary | ICD-10-CM | POA: Diagnosis not present

## 2022-12-20 DIAGNOSIS — K562 Volvulus: Secondary | ICD-10-CM

## 2022-12-20 DIAGNOSIS — I469 Cardiac arrest, cause unspecified: Secondary | ICD-10-CM | POA: Diagnosis not present

## 2022-12-20 LAB — GLUCOSE, CAPILLARY
Glucose-Capillary: 114 mg/dL — ABNORMAL HIGH (ref 70–99)
Glucose-Capillary: 129 mg/dL — ABNORMAL HIGH (ref 70–99)
Glucose-Capillary: 142 mg/dL — ABNORMAL HIGH (ref 70–99)
Glucose-Capillary: 147 mg/dL — ABNORMAL HIGH (ref 70–99)
Glucose-Capillary: 149 mg/dL — ABNORMAL HIGH (ref 70–99)
Glucose-Capillary: 155 mg/dL — ABNORMAL HIGH (ref 70–99)

## 2022-12-20 LAB — HEPARIN LEVEL (UNFRACTIONATED)
Heparin Unfractionated: 0.19 [IU]/mL — ABNORMAL LOW (ref 0.30–0.70)
Heparin Unfractionated: 0.24 [IU]/mL — ABNORMAL LOW (ref 0.30–0.70)
Heparin Unfractionated: 0.41 [IU]/mL (ref 0.30–0.70)

## 2022-12-20 LAB — CBC
HCT: 24.1 % — ABNORMAL LOW (ref 36.0–46.0)
Hemoglobin: 7.8 g/dL — ABNORMAL LOW (ref 12.0–15.0)
MCH: 27.6 pg (ref 26.0–34.0)
MCHC: 32.4 g/dL (ref 30.0–36.0)
MCV: 85.2 fL (ref 80.0–100.0)
Platelets: 82 10*3/uL — ABNORMAL LOW (ref 150–400)
RBC: 2.83 MIL/uL — ABNORMAL LOW (ref 3.87–5.11)
RDW: 19.7 % — ABNORMAL HIGH (ref 11.5–15.5)
WBC: 18.2 10*3/uL — ABNORMAL HIGH (ref 4.0–10.5)
nRBC: 0 % (ref 0.0–0.2)

## 2022-12-20 LAB — BASIC METABOLIC PANEL
Anion gap: 8 (ref 5–15)
BUN: 45 mg/dL — ABNORMAL HIGH (ref 6–20)
CO2: 23 mmol/L (ref 22–32)
Calcium: 7 mg/dL — ABNORMAL LOW (ref 8.9–10.3)
Chloride: 107 mmol/L (ref 98–111)
Creatinine, Ser: 1.67 mg/dL — ABNORMAL HIGH (ref 0.44–1.00)
GFR, Estimated: 38 mL/min — ABNORMAL LOW (ref >60)
Glucose, Bld: 163 mg/dL — ABNORMAL HIGH (ref 70–99)
Potassium: 3.5 mmol/L (ref 3.5–5.1)
Sodium: 138 mmol/L (ref 135–145)

## 2022-12-20 LAB — ECHOCARDIOGRAM LIMITED
Height: 64 in
S' Lateral: 2.1 cm
Weight: 2246.93 [oz_av]

## 2022-12-20 LAB — MAGNESIUM
Magnesium: 1.8 mg/dL (ref 1.7–2.4)
Magnesium: 1.9 mg/dL (ref 1.7–2.4)

## 2022-12-20 LAB — PHOSPHORUS
Phosphorus: 2.8 mg/dL (ref 2.5–4.6)
Phosphorus: 2.9 mg/dL (ref 2.5–4.6)

## 2022-12-20 LAB — LIPASE, BLOOD: Lipase: 650 U/L — ABNORMAL HIGH (ref 11–51)

## 2022-12-20 LAB — TRIGLYCERIDES: Triglycerides: 136 mg/dL (ref <150)

## 2022-12-20 MED ORDER — ALBUTEROL SULFATE (2.5 MG/3ML) 0.083% IN NEBU
2.5000 mg | INHALATION_SOLUTION | RESPIRATORY_TRACT | Status: DC | PRN
  Administered 2023-01-02: 2.5 mg via RESPIRATORY_TRACT
  Filled 2022-12-20: qty 3

## 2022-12-20 MED ORDER — LOPERAMIDE HCL 2 MG PO CAPS
2.0000 mg | ORAL_CAPSULE | Freq: Two times a day (BID) | ORAL | Status: DC
  Administered 2022-12-20 – 2022-12-22 (×5): 2 mg via ORAL
  Filled 2022-12-20 (×6): qty 1

## 2022-12-20 MED ORDER — FERROUS SULFATE 325 (65 FE) MG PO TABS
325.0000 mg | ORAL_TABLET | Freq: Two times a day (BID) | ORAL | Status: DC
  Filled 2022-12-20 (×2): qty 1

## 2022-12-20 MED ORDER — TRAVASOL 10 % IV SOLN
INTRAVENOUS | Status: AC
  Filled 2022-12-20: qty 940.8

## 2022-12-20 MED ORDER — POTASSIUM CHLORIDE 10 MEQ/50ML IV SOLN
10.0000 meq | INTRAVENOUS | Status: AC
  Administered 2022-12-20 (×3): 10 meq via INTRAVENOUS
  Filled 2022-12-20 (×3): qty 50

## 2022-12-20 MED ORDER — ACETAMINOPHEN 325 MG PO TABS: 650.0000 mg | ORAL_TABLET | Freq: Four times a day (QID) | ORAL | Status: DC | PRN

## 2022-12-20 MED ORDER — INSULIN ASPART 100 UNIT/ML IJ SOLN
0.0000 [IU] | Freq: Four times a day (QID) | INTRAMUSCULAR | Status: DC
  Administered 2022-12-20 (×2): 2 [IU] via SUBCUTANEOUS
  Administered 2022-12-20: 3 [IU] via SUBCUTANEOUS
  Administered 2022-12-21: 2 [IU] via SUBCUTANEOUS
  Administered 2022-12-21: 1 [IU] via SUBCUTANEOUS
  Administered 2022-12-21: 2 [IU] via SUBCUTANEOUS

## 2022-12-20 MED ORDER — METOPROLOL TARTRATE 5 MG/5ML IV SOLN
2.5000 mg | INTRAVENOUS | Status: DC | PRN
  Administered 2022-12-20: 5 mg via INTRAVENOUS
  Administered 2022-12-21: 2.5 mg via INTRAVENOUS
  Administered 2022-12-21: 5 mg via INTRAVENOUS
  Filled 2022-12-20 (×3): qty 5

## 2022-12-20 MED ORDER — METOPROLOL TARTRATE 12.5 MG HALF TABLET
12.5000 mg | ORAL_TABLET | Freq: Two times a day (BID) | ORAL | Status: DC
  Administered 2022-12-20 – 2022-12-21 (×3): 12.5 mg via ORAL
  Filled 2022-12-20 (×3): qty 1

## 2022-12-20 MED ORDER — ACETAMINOPHEN 160 MG/5ML PO SOLN
650.0000 mg | Freq: Four times a day (QID) | ORAL | Status: DC | PRN
  Administered 2022-12-21 (×2): 650 mg via ORAL
  Filled 2022-12-20 (×2): qty 20.3

## 2022-12-20 MED ORDER — POTASSIUM PHOSPHATES 15 MMOLE/5ML IV SOLN
15.0000 mmol | Freq: Once | INTRAVENOUS | Status: AC
  Administered 2022-12-20: 15 mmol via INTRAVENOUS
  Filled 2022-12-20: qty 5

## 2022-12-20 MED ORDER — CALCIUM POLYCARBOPHIL 625 MG PO TABS
625.0000 mg | ORAL_TABLET | Freq: Two times a day (BID) | ORAL | Status: DC
  Administered 2022-12-20 – 2022-12-27 (×14): 625 mg via ORAL
  Filled 2022-12-20 (×14): qty 1

## 2022-12-20 MED ORDER — METOPROLOL TARTRATE 5 MG/5ML IV SOLN
2.5000 mg | Freq: Once | INTRAVENOUS | Status: AC
  Administered 2022-12-20: 2.5 mg via INTRAVENOUS
  Filled 2022-12-20: qty 5

## 2022-12-20 MED ORDER — LOPERAMIDE HCL 2 MG PO CAPS
2.0000 mg | ORAL_CAPSULE | Freq: Four times a day (QID) | ORAL | Status: DC | PRN
  Administered 2022-12-23: 2 mg via ORAL

## 2022-12-20 NOTE — Progress Notes (Signed)
Afternoon rounds. Looks improved today. Has been able to self suction a little today. Still difficulty getting deeper secretions. Doing NTS as able. Obtaining speech swallow study. Tried single dose IV metoprolol which improved her HR and BP briefly, started on BID 12.5mg  oral metoprolol. Echo with no significant anomalies. No wall motion abnormalities or reduced EF. Vas duplex shows no DVT bilaterally. Continues on heparin gtt for PE. Labs trending in the right direction.

## 2022-12-20 NOTE — Progress Notes (Signed)
ANTICOAGULATION CONSULT NOTE   Pharmacy Consult for heparin  Indication: pulmonary embolus  Allergies  Allergen Reactions   Nsaids Other (See Comments)    Crohns disease/ IBD    Patient Measurements: Height: 5\' 4"  (162.6 cm) Weight: 63.7 kg (140 lb 6.9 oz) IBW/kg (Calculated) : 54.7 Heparin Dosing Weight: 46.4 kg (admit wt on 8/28)  Vital Signs: Temp: 99 F (37.2 C) (09/10 2300) Temp Source: Bladder (09/11 0400) BP: 161/88 (09/10 2300) Pulse Rate: 136 (09/10 2300)  Labs: Recent Labs    12/17/22 1729 12/18/22 0705 12/18/22 1000 12/19/22 0502 12/20/22 0347 12/20/22 0355  HGB  --   --  8.2* 8.0* 7.8*  --   HCT  --   --  23.9* 24.0* 24.1*  --   PLT  --   --  52* 71* 82*  --   HEPARINUNFRC  --   --   --   --   --  0.19*  CREATININE 1.86* 1.80*  --  1.71*  --   --     Estimated Creatinine Clearance: 35.9 mL/min (A) (by C-G formula based on SCr of 1.71 mg/dL (H)).   Medical History: Past Medical History:  Diagnosis Date   Breast cancer (HCC)    right   Colon stricture (HCC) 05/07/2019   Crohn's colitis, with intestinal obstruction (HCC) 09/17/2013   Dx 2006 - right and left colon involved 2011 - pan-colitis 09/17/2013 left colitis 60-20 cm March 2021 subtotal colectomy ileosigmoid anastomosis for colonic strictures-Dr. Maisie Fus     Crohn's disease [facility])    Family history of breast cancer 06/11/2020   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 10/17/2010   Rectal bleeding    Uterine fibroid    Vitamin D deficiency 09/24/2013    Medications:  Medications Prior to Admission  Medication Sig Dispense Refill Last Dose   acetaminophen (TYLENOL) 500 MG tablet Take 500-1,000 mg by mouth every 6 (six) hours as needed for mild pain.   12/12/2022   cholecalciferol (VITAMIN D3) 25 MCG (1000 UNIT) tablet Take 1,000 Units by mouth daily.   12/12/2022   hyoscyamine (LEVSIN SL) 0.125 MG SL tablet Place 1 tablet (0.125 mg total) under the tongue every 6  (six) hours as needed for cramping. 30 tablet 0 unknown   Multiple Vitamins-Minerals (MULTIVITAMIN WITH MINERALS) tablet Take 1 tablet by mouth daily.   Past Week   nystatin (MYCOSTATIN) 100000 UNIT/ML suspension Take 5 mLs (500,000 Units total) by mouth 4 (four) times daily. 473 mL 0 Past Month   potassium chloride SA (KLOR-CON M) 20 MEQ tablet Take 1 tablet (20 mEq total) by mouth daily. 90 tablet 2 12/12/2022   predniSONE (DELTASONE) 20 MG tablet Take 2 tablets (40 mg total) by mouth daily with breakfast for 7 days, THEN 1.5 tablets (30 mg total) daily with breakfast for 7 days, THEN 1 tablet (20 mg total) daily with breakfast for 21 days. 45.5 tablet 0 12/12/2022   simethicone (MYLICON) 80 MG chewable tablet Chew 1 tablet (80 mg total) by mouth 4 (four) times daily as needed for flatulence. 30 tablet 0 unknown    Assessment: Pharmacy consulted to dose heparin for new LLL PE.   OR 9/5 s/p ex lap w/ SBR OR 9/7 OR ex lap, ileostomy, abd closure Per [facility] OK to start heparin with no bolus.  Hg 8; PLT 71; SCr 1.71  12/20/22 Heparin level = 0.19 (subtherapeutic) with heparin gtt @ 850 units/hr Hgb = 7.8, Pltc 82K (  low but stable) Per RN no interruption in heparin infusion; no line issues.  No bleeding complications noted.  Goal of Therapy:  Heparin level 0.3-0.7 units/ml Monitor platelets by anticoagulation protocol: Yes   Plan:  No bolus per MD Increase heparin gtt to 1000 units/hr and check 8 hour heparin level after heparin rate increase Daily CBC & heparin level  Terrilee Files, PharmD 12/20/2022 4:54 AM

## 2022-12-20 NOTE — Progress Notes (Signed)
NAME:  [name], MRN:  [id number], DOB:  June 05, 1975, LOS: 7 ADMISSION DATE:  12/12/2022, CONSULTATION DATE:  12/13/22 REFERRING MD:  Rubin Payor, MD CHIEF COMPLAINT:  cardiac arrest    History of Present Illness:  47 year old female with past medical history of recurrent breast cancer not currently on treatment, Crohn's disease s/p subtotal colectomy in 2021 recurrent obstructions, colonic stricture, IDA, recent hospitalization 8/10-8/15/2024 for obstruction seen by GI and general surgery. Underwent EGD and flexible sigmoidoscopy and discharged on steroids who presented to the emergency department on 12/12/2022 with abdominal pain, nausea, vomiting diarrhea. In the ED tachycardic, hypertensive. ED provider notes abdominal distention. She was given 2L IVF, morphine, IV PPI. Labs with WBC 10.8, Hgb 10.7, K 3.2. CT AP was ordered and showed markedly dilated distal small bowel consistent with chronic obstruction. No changes since prior study. Patient was admitted to [facility]. They consulted GI for input. Unfortunately, around 0820 this morning patient was found on the floor in the bathroom in ED pulseless. They estimate downtime around 5 minutes. ACLS was initiated. Concern for possible torsades de pointes and given IV mag, defibrillated. Coded for around 5 minutes. She was intubated. She had copious vomitus in the ETT. ROSC achieved and ICU was consulted. Notably, she had I-stat peri-code which showed hemoglobin of 5.5 down from 10 overnight.   Pertinent  Medical History  Crohn's disease with previous bowel obstruction s/p subtotal colectomy 2021, colonic stricture, IDA, breast cancer s/p mastectomy 2022  Significant Hospital Events: Including procedures, antibiotic start and stop dates in addition to other pertinent events   9/3: admitted to [facility] for abdominal distention, presumed Crohn's exacerbation 9/4: ~0820 cardiac arrest in ED. Admitted to ICU intubated, sedated. Hgb 5.5. Placed right femoral CVC.  Received 3 UPRBC, 3 FFP. 9/5: - Hgb stable at 11.7 after 3u PRBC, 3 FPP 9/5 overnight increasing pressor requirements - added vaso. Bladder pressures 28, went for exploratory lap w/ sb resxn, and celiotomy, intra op findings: internal hernia w/ associated small bowel ischemia. Also seen by GI and palliative. Full scope of care desired  9/6 some ST changes in lateral leads. Weaning pressors. Removing femoral line and placing internal jugular. Renal fxn worse, on-going metabolic acidosis.  9/7: back to OR for ostomy placement.  9/8: off levo 9/9: weaning sedation. Following commands. Tried PS of 5/5 which Vt did not hold up. Giving IV lasix for anasarca, she's 20+ L up on admission. Will try SBT again this afternoon.  9/10: extubated, +PE on heparin gtt Interim History / Subjective:  Awake, alert. Denies pain this morning.   Objective   Blood pressure (!) 161/88, pulse (!) 136, temperature 99 F (37.2 C), resp. rate 19, height 5\' 4"  (1.626 m), weight 63.7 kg, last menstrual period 04/24/2021, SpO2 100%.    Vent Mode: CPAP;PSV FiO2 (%):  [28 %] 28 % PEEP:  [5 cmH20] 5 cmH20 Pressure Support:  [5 cmH20-14 cmH20] 5 cmH20   Intake/Output Summary (Last 24 hours) at 12/20/2022 0713 Last data filed at 12/20/2022 0700 Gross per 24 hour  Intake 3354.74 ml  Output 3400 ml  Net -45.26 ml   Filed Weights   12/16/22 0500 12/17/22 0419 12/19/22 0500  Weight: 62.3 kg 65.8 kg 63.7 kg    Examination: General: middle aged female in bed in no acute distress  HENT: NGT in place, upper airway secretions Pulm: rhonchi bilaterally, 3LNC, weak cough  Card: S1/S2 without murmur, tachycardia, rub, gallop Abd: Postsurgical site is clean, dry and intact.  Ostomy in the left abdomen is pink, moist without dusky appearance.  There is a small amount of output. Extremities: edematous  Neuro: alert, oriented, appropriate. Non focal   Resolved Hospital Problem list   Cardiac arrest  Shock Shock  liver Bradycardia  Acute hypoxic respiratory failure  Assessment & Plan:  Sepsis: Likely secondary to bacterial translocation from ischemic gut.  Levophed off 12/17/2022. -Continue Zosyn -taper steroids to daily solu-cortef   Small bowel obstruction secondary to internal hernia and resultant ischemic gut status post exploratory laparotomy, status post ostomy placement.  Tolerated surgery well yesterday.  Surgical site appears clean, dry, and intact. Now having output -Appreciate surgery recommendations  -Keep NGT for trickle feed  -Continue TPN, appreciate dietary input -Entyvio on hold  Pulmonary Embolism; LLL segmental PE  - heparin gtt  - obtain echo  - obtain bilateral venous duplex DVT studies   Elevated Lipase; unlikely propofol related. She did have low grade temp yesterday. 208 -> 650 -trend   Tachycardia; unclear etiology. She does have acute PE but it is subsegmental. Felt likely not volume related. No evidence of bleed. Not febrile. No thyroid history. Does not take rate control medication at home. Doubt withdrawal from Versed gtt.  - echo today  - + metoprolol 12.5 PO BID  AKI secondary to sepsis and endorgan hypoperfusion -Maintain renal perfusion -Trend electrolytes -Avoid nephrotoxic medications  Hypertension; no history of hypertension, not on antihypertensives at home. Could be pain, anxiety, acute illness related  - + metoprolol 12.5 PO BID  Acute on chronic anemia -Continue to monitor -Transfuse per protocol  Thrombocytopenia -Likely related to acute illness. Resolving slowly -Continue to trend  ST elevation in the lateral leads -Remains stable -No new changes on EKG  Hyperglycemia -Continue SSI  Recurrent breast cancer not currently on treatment; per Dr. Pamelia Hoit note 10/11/22, patient has declined further treatment at this time.  -follow-up in September to discuss anti-estrogen therapy.   Best Practice (right click and "Reselect all SmartList  Selections" daily)   Diet/type: tubefeeds and TPN DVT prophylaxis: systemic heparin  GI prophylaxis: PPI Lines: Central line and yes and it is still needed Foley:  Yes, and it is still needed Code Status:  full code Last date of multidisciplinary goals of care discussion: Answered all questions for family at bedside at this time.  Critical care time: 30

## 2022-12-20 NOTE — Progress Notes (Addendum)
Pt NT suctioned x1 down right nare for small amount of thick tan pink-tinged secretions.  Pt hyper-oxygenated on 15L nrb prior to suctioning.  Pt tolerated well without incident.  RN assisted at bedside.

## 2022-12-20 NOTE — Progress Notes (Signed)
PT Cancellation Note  Patient Details Name: DESTRI ROSENWINKEL MRN: [id number] DOB: 08-Aug-1975   Cancelled Treatment:    Reason Eval/Treat Not Completed: Medical issues which prohibited therapy, new PE diagnosis, per PT protocol,  PT starts 24 hours post  Hep initiation.  Will check back another time. Blanchard Kelch PT Acute Rehabilitation Services Office [phone] Weekend pager-[phone]    [name] 12/20/2022, 8:31 AM

## 2022-12-20 NOTE — Progress Notes (Signed)
ANTICOAGULATION CONSULT NOTE   Pharmacy Consult for heparin  Indication: pulmonary embolus  Allergies  Allergen Reactions   Nsaids Other (See Comments)    Crohns disease/ IBD    Patient Measurements: Height: 5\' 4"  (162.6 cm) Weight: 63.7 kg (140 lb 6.9 oz) IBW/kg (Calculated) : 54.7 Heparin Dosing Weight: 46.4 kg (admit wt on 8/28)  Vital Signs: Temp: 99.5 F (37.5 C) (09/11 1700) Temp Source: Bladder (09/11 1600) BP: 161/92 (09/11 2037) Pulse Rate: 143 (09/11 2037)  Labs: Recent Labs    12/18/22 0705 12/18/22 1000 12/18/22 1000 12/19/22 0502 12/20/22 0347 12/20/22 0355 12/20/22 1314 12/20/22 2243  HGB  --  8.2*   < > 8.0* 7.8*  --   --   --   HCT  --  23.9*  --  24.0* 24.1*  --   --   --   PLT  --  52*  --  71* 82*  --   --   --   HEPARINUNFRC  --   --   --   --   --  0.19* 0.24* 0.41  CREATININE 1.80*  --   --  1.71* 1.67*  --   --   --    < > = values in this interval not displayed.    Estimated Creatinine Clearance: 36.7 mL/min (A) (by C-G formula based on SCr of 1.67 mg/dL (H)).  Medications:  Medications Prior to Admission  Medication Sig Dispense Refill Last Dose   acetaminophen (TYLENOL) 500 MG tablet Take 500-1,000 mg by mouth every 6 (six) hours as needed for mild pain.   12/12/2022   cholecalciferol (VITAMIN D3) 25 MCG (1000 UNIT) tablet Take 1,000 Units by mouth daily.   12/12/2022   hyoscyamine (LEVSIN SL) 0.125 MG SL tablet Place 1 tablet (0.125 mg total) under the tongue every 6 (six) hours as needed for cramping. 30 tablet 0 unknown   Multiple Vitamins-Minerals (MULTIVITAMIN WITH MINERALS) tablet Take 1 tablet by mouth daily.   Past Week   nystatin (MYCOSTATIN) 100000 UNIT/ML suspension Take 5 mLs (500,000 Units total) by mouth 4 (four) times daily. 473 mL 0 Past Month   potassium chloride SA (KLOR-CON M) 20 MEQ tablet Take 1 tablet (20 mEq total) by mouth daily. 90 tablet 2 12/12/2022   predniSONE (DELTASONE) 20 MG tablet Take 2 tablets (40 mg total)  by mouth daily with breakfast for 7 days, THEN 1.5 tablets (30 mg total) daily with breakfast for 7 days, THEN 1 tablet (20 mg total) daily with breakfast for 21 days. 45.5 tablet 0 12/12/2022   simethicone (MYLICON) 80 MG chewable tablet Chew 1 tablet (80 mg total) by mouth 4 (four) times daily as needed for flatulence. 30 tablet 0 unknown    Assessment: Pharmacy consulted to dose heparin for new LLL PE.   OR 9/5 s/p ex lap w/ massive SBR OR 9/7 OR ex lap, ileostomy, abd closure Per [facility] OK to start heparin with no bolus.   Today, 12/20/22 22:43 Heparin level = 0.41 (therapeutic) with heparin gtt @ 1200 units/hr  No bleeding complications noted  Goal of Therapy:  Heparin level 0.3-0.7 units/ml Monitor platelets by anticoagulation protocol: Yes   Plan:  No bolus per MD Continue heparin gtt @ 1200 units/hr and check heparin level with AM labs to confirm therapeutic dose Daily CBC & heparin level F/u plans for long-term anticoagulation  Terrilee Files, PharmD 12/20/2022, 11:09 PM

## 2022-12-20 NOTE — Progress Notes (Signed)
Bilateral lower extremity venous duplex has been completed. Preliminary results can be found in CV Proc through chart review.   12/20/22 8:54 AM Olen Cordial RVT

## 2022-12-20 NOTE — Progress Notes (Signed)
[facility] Progress Note  4 Days Post-Op  Subjective: CC-  Extubated yesterday.  Reports pain is well-controlled, no cramping or nausea with tube feeds at low rate but ostomy output is already quite high.  Heparin drip started yesterday for PE.  Objective: Vital signs in last 24 hours: Temp:  [98.6 F (37 C)-99.5 F (37.5 C)] 98.6 F (37 C) (09/11 0800) Pulse Rate:  [96-145] 122 (09/11 0800) Resp:  [16-31] 26 (09/11 0800) BP: (135-183)/(84-118) 172/118 (09/11 0800) SpO2:  [95 %-100 %] 100 % (09/11 0800) FiO2 (%):  [28 %] 28 % (09/10 0950) Last BM Date : 12/20/22  Intake/Output from previous day: 09/10 0701 - 09/11 0700 In: 3354.7 [I.V.:2683.4; NG/GT:332.3; IV Piggyback:339] Out: 3400 [Urine:1775; Stool:1625] Intake/Output this shift: Total I/O In: 648 [I.V.:553.8; NG/GT:71; IV Piggyback:23.2] Out: -   PE: Gen: Appears comfortable HEENT: NG present Card:  RRR Pulm: Labored respirations Abd: soft, ND, appropriately tender.  Midline wound is clean and fascia is intact.  Ongoing copious serous drainage from lower aspect of the incision.  Stoma is pink and productive with almost 2 L out in last 24 hours.  Lab Results:  Recent Labs    12/19/22 0502 12/20/22 0347  WBC 19.8* 18.2*  HGB 8.0* 7.8*  HCT 24.0* 24.1*  PLT 71* 82*   BMET Recent Labs    12/19/22 0502 12/20/22 0347  NA 140 138  K 3.6 3.5  CL 107 107  CO2 23 23  GLUCOSE 302* 163*  BUN 39* 45*  CREATININE 1.71* 1.67*  CALCIUM 7.6* 7.0*   PT/INR No results for input(s): "LABPROT", "INR" in the last 72 hours. CMP     Component Value Date/Time   NA 138 12/20/2022 0347   NA 135 01/21/2020 1056   K 3.5 12/20/2022 0347   CL 107 12/20/2022 0347   CO2 23 12/20/2022 0347   GLUCOSE 163 (H) 12/20/2022 0347   BUN 45 (H) 12/20/2022 0347   BUN 12 01/21/2020 1056   CREATININE 1.67 (H) 12/20/2022 0347   CREATININE 0.91 09/21/2022 1141   CALCIUM 7.0 (L) 12/20/2022 0347   PROT 4.0 (L) 12/19/2022  0502   ALBUMIN <1.5 (L) 12/19/2022 0502   AST 42 (H) 12/19/2022 0502   AST 18 09/21/2022 1141   ALT 41 12/19/2022 0502   ALT 12 09/21/2022 1141   ALKPHOS 87 12/19/2022 0502   BILITOT 1.0 12/19/2022 0502   BILITOT 0.4 09/21/2022 1141   GFRNONAA 38 (L) 12/20/2022 0347   GFRNONAA >60 09/21/2022 1141   GFRAA 108 01/21/2020 1056   Lipase     Component Value Date/Time   LIPASE 650 (H) 12/20/2022 0347       Studies/Results: VAS [place] LOWER EXTREMITY VENOUS (DVT)  Result Date: 12/20/2022  Lower Venous DVT Study Patient Name:  SHAWNTINA TARANTOLA  Date of Exam:   12/20/2022 Medical Rec #: [id number]         Accession #:    [id number] Date of Birth: 1975-12-26         Patient Gender: F Patient Age:   47 years Exam Location:  [facility] Procedure:      VAS [place] LOWER EXTREMITY VENOUS (DVT) Referring Phys: Mertha Baars --------------------------------------------------------------------------------  Indications: Pulmonary embolism.  Risk Factors: Cancer. Limitations: Poor ultrasound/tissue interface, bandages and patient positioning, patient immobility. Comparison Study: 12/14/2022 - Negative for DVT. Performing Technologist: Chanda Busing RVT  Examination Guidelines: A complete evaluation includes B-mode imaging, spectral Doppler, color Doppler, and power Doppler as needed  of all accessible portions of each vessel. Bilateral testing is considered an integral part of a complete examination. Limited examinations for reoccurring indications may be performed as noted. The reflux portion of the exam is performed with the patient in reverse Trendelenburg.  +---------+---------------+---------+-----------+----------+--------------+ RIGHT    CompressibilityPhasicitySpontaneityPropertiesThrombus Aging +---------+---------------+---------+-----------+----------+--------------+ CFV      Full           Yes      Yes                                  +---------+---------------+---------+-----------+----------+--------------+ SFJ      Full                                                        +---------+---------------+---------+-----------+----------+--------------+ FV Prox  Full                                                        +---------+---------------+---------+-----------+----------+--------------+ FV Mid                  Yes      Yes                                 +---------+---------------+---------+-----------+----------+--------------+ FV Distal               Yes      Yes                                 +---------+---------------+---------+-----------+----------+--------------+ PFV      Full                                                        +---------+---------------+---------+-----------+----------+--------------+ POP      Full           Yes      Yes                                 +---------+---------------+---------+-----------+----------+--------------+ PTV      Full                                                        +---------+---------------+---------+-----------+----------+--------------+ PERO     Full                                                        +---------+---------------+---------+-----------+----------+--------------+   +---------+---------------+---------+-----------+----------+--------------+ LEFT  CompressibilityPhasicitySpontaneityPropertiesThrombus Aging +---------+---------------+---------+-----------+----------+--------------+ CFV      Full           Yes      No                                  +---------+---------------+---------+-----------+----------+--------------+ SFJ      Full                                                        +---------+---------------+---------+-----------+----------+--------------+ FV Prox  Full                                                         +---------+---------------+---------+-----------+----------+--------------+ FV Mid                  Yes      Yes                                 +---------+---------------+---------+-----------+----------+--------------+ FV Distal               Yes      Yes                                 +---------+---------------+---------+-----------+----------+--------------+ PFV      Full                                                        +---------+---------------+---------+-----------+----------+--------------+ POP      Full           Yes      Yes                                 +---------+---------------+---------+-----------+----------+--------------+ PTV      Full                                                        +---------+---------------+---------+-----------+----------+--------------+ PERO     Full                                                        +---------+---------------+---------+-----------+----------+--------------+    Summary: RIGHT: - There is no evidence of deep vein thrombosis in the lower extremity. However, portions of this examination were limited- see technologist comments above.  - No cystic structure found in the popliteal fossa.  LEFT: - There is no evidence of deep vein thrombosis in the lower extremity. However,  portions of this examination were limited- see technologist comments above.  - No cystic structure found in the popliteal fossa.  *See table(s) above for measurements and observations.    Preliminary    CT Angio Chest Pulmonary Embolism (PE) W or WO Contrast  Result Date: 12/19/2022 CLINICAL DATA:  History of breast cancer and Crohn's disease. Chest pain. EXAM: CT ANGIOGRAPHY CHEST WITH CONTRAST TECHNIQUE: Multidetector CT imaging of the chest was performed using the standard protocol during bolus administration of intravenous contrast. Multiplanar CT image reconstructions and MIPs were obtained to evaluate the vascular anatomy.  RADIATION DOSE REDUCTION: This exam was performed according to the departmental dose-optimization program which includes automated exposure control, adjustment of the mA and/or kV according to patient size and/or use of iterative reconstruction technique. CONTRAST:  75mL OMNIPAQUE IOHEXOL 350 MG/ML SOLN COMPARISON:  Most recent CT scan of the chest 11/18/2022 FINDINGS: Cardiovascular: Adequate opacification of the pulmonary arteries to the segmental level. Positive for segmental PE in the left lower lobe pulmonary artery distribution. Left IJ approach port catheter. The heart is normal in size. No pericardial effusion. Mediastinum/Nodes: No enlarged mediastinal, hilar, or axillary lymph nodes. Thyroid gland, trachea, and esophagus demonstrate no significant findings. Lungs/Pleura: Moderate right and small left layering pleural effusions with associated bibasilar atelectasis. No pulmonary edema. The degree of atelectasis is slightly greater than expected. Small volume aspiration or lower lobe pneumonia is difficult to exclude. Upper Abdomen: Gastric tube present with the tip in the region of the pylorus. Small volume ascites. Musculoskeletal: No chest wall abnormality. No acute or significant osseous findings. Review of the MIP images confirms the above findings. IMPRESSION: 1. Positive for segmental PE in the left lower lobe pulmonary artery branches. 2. Moderate to large right and small to moderate left pleural effusions with associated bibasilar atelectasis. 3. Greater than expected consolidative changes in the bilateral lower lobes than expected for simple atelectasis. Superimposed small volume aspiration or pneumonia is suspected. 4. Partially imaged ascites. 5. Gastric tube present with the tip in the region of the pylorus. 6. Left IJ approach port catheter in place. These results will be called to the ordering clinician or representative by the Radiologist Assistant, and communication documented in the PACS or  [employer]. Electronically Signed   By: Malachy Moan M.D.   On: 12/19/2022 16:47   DG Abd 1 View  Result Date: 12/19/2022 CLINICAL DATA:  Nasogastric tube placement. EXAM: ABDOMEN - 1 VIEW COMPARISON:  December 13, 2022. FINDINGS: Distal tip of nasogastric tube is seen in expected position of distal stomach. IMPRESSION: Distal tip of nasogastric tube is seen in expected position of distal stomach. Electronically Signed   By: Lupita Raider M.D.   On: 12/19/2022 13:30   DG CHEST PORT 1 VIEW  Result Date: 12/19/2022 CLINICAL DATA:  Fever and intubation EXAM: PORTABLE CHEST 1 VIEW COMPARISON:  Three days ago FINDINGS: Endotracheal tube with tip at the clavicular heads. Right subclavian line with tip at the SVC. Left IJ porta catheter are with similar termination. An enteric tube at least reaches the stomach. Low volume chest with hazy density at the bases. The diaphragm is less well seen on the left when compared to prior. No Kerley lines or pneumothorax. Normal heart size. Extensive artifact from EKG leads. IMPRESSION: 1. Stable hardware. 2. Bilateral airspace disease with worsening on the left. History of fever suggests underlying pneumonia. Electronically Signed   By: Tiburcio Pea M.D.   On: 12/19/2022 08:51    Anti-infectives:  Anti-infectives (From admission, onward)    Start     Dose/Rate Route Frequency Ordered Stop   12/13/22 1000  piperacillin-tazobactam (ZOSYN) IVPB 3.375 g        3.375 g 12.5 mL/hr over 240 Minutes Intravenous Every 8 hours 12/13/22 9528          Assessment/Plan Hx Crohn's disease s/p subtotal colectomy  Internal hernia with bowel ischemia -S/p exploratory laparotomy with small bowel resection 9/5 Dr. Hillery Hunter -POD#4 s/p exploratory laparotomy, ileostomy, abdominal closure 9/7 Dr. Hillery Hunter - approximately 100cc small bowel left and will almost definitely be TPN dependent, highly likely to have high ostomy output and will not likely tolerate tube  feeds -If she is cleared for swallow, okay to let her try clears and advance as tolerated to regular diet. Continue TPN - Midline incision care: WTD BID - WOC consult for new ostomy  ID - zosyn FEN - NGT, TPN-monitor ostomy output and will likely need intermittent fluid replacement for this.  When she is tolerating p.o. can initiate Imodium/Lomotil/fiber etc. to hopefully control her ostomy output VTE - scd, HEP GTT STARTED 9/10 Foley - present  --per CCM-- Shock - off pressors VDRF pe Recent cardiac arrest 9/3 AKI - improving Anemia Thrombocytopenia Hx metastatic breast cancer   LOS: 7 days    Berna Bue, MD [facility] 12/20/2022, 9:00 AM Please see Amion for pager number during day hours 7:00am-4:30pm

## 2022-12-20 NOTE — TOC Initial Note (Signed)
Transition of Care [facility]) - Initial/Assessment Note    Patient Details  Name: [name] MRN: [id number] Date of Birth: June 23, 1975  Transition of Care [facility]) CM/SW Contact:    Lavenia Atlas, RN Phone Number: 12/20/2022, 4:04 PM  Clinical Narrative:  Per chart review no current TOC needs. Spoke with patient's daughter [name] who reports prior to admission patient did not have any DME or HH services. No current concerns at this time.   TOC will continue to follow for dc needs.                 Expected Discharge Plan: Home/Self Care (unsure at this time) Barriers to Discharge: Continued Medical Work up   Patient Goals and CMS Choice Patient states their goals for this hospitalization and ongoing recovery are:: return home feeling better CMS Medicare.gov Compare Post Acute Care list provided to:: Patient Represenative (must comment) Choice offered to / list presented to : Adult Children [place] ownership interest in [facility].provided to:: Adult Children    Expected Discharge Plan and Services In-house Referral: Hospice / Palliative Care Discharge Planning Services: CM Consult Post Acute Care Choice: NA Living arrangements for the past 2 months: Single Family Home                 DME Arranged: N/A DME Agency: NA       HH Arranged: NA HH Agency: NA        Prior Living Arrangements/Services Living arrangements for the past 2 months: Single Family Home Lives with:: Relatives Patient language and need for interpreter reviewed:: Yes Do you feel safe going back to the place where you live?: Yes      Need for Family Participation in Patient Care: No (Comment) Care giver support system in place?: Yes (comment) Current home services: Other (comment) (none) Criminal Activity/Legal Involvement Pertinent to Current Situation/Hospitalization: No - Comment as needed  Activities of Daily Living Home Assistive Devices/Equipment: None ADL Screening (condition at  time of admission) Patient's cognitive ability adequate to safely complete daily activities?: Yes Is the patient deaf or have difficulty hearing?: No Does the patient have difficulty seeing, even when wearing glasses/contacts?: No Does the patient have difficulty concentrating, remembering, or making decisions?: No Patient able to express need for assistance with ADLs?: Yes Does the patient have difficulty dressing or bathing?: No Independently performs ADLs?: Yes (appropriate for developmental age) Communication: Dependent Is this a change from baseline?: Change from baseline, expected to last <3 days Dressing (OT): Dependent Is this a change from baseline?: Change from baseline, expected to last <3days Grooming: Dependent Is this a change from baseline?: Change from baseline, expected to last <3 days Does the patient have difficulty walking or climbing stairs?: No Weakness of Legs: Both Weakness of Arms/Hands: Both  Permission Sought/Granted Permission sought to share information with : Case Manager Permission granted to share information with : Yes, Verbal Permission Granted  Share Information with NAME: Social research officer, government           Emotional Assessment Appearance:: Appears stated age Attitude/Demeanor/Rapport: Gracious Affect (typically observed): Accepting Orientation: : Oriented to Self, Oriented to Place, Oriented to  Time Alcohol / Substance Use: Not Applicable Psych Involvement: No (comment)  Admission diagnosis:  SBO (small bowel obstruction) (HCC) [K56.609] Exacerbation of Crohn's disease of large intestine (HCC) [K50.10] Abdominal pain, unspecified abdominal location [R10.9] Patient Active Problem List   Diagnosis Date Noted   Malnutrition of moderate degree 12/15/2022   History of resection of  small bowel 12/15/2022   Crohn's disease of small intestine with intestinal obstruction (HCC) 12/15/2022   Septic shock (HCC) 12/14/2022   Non-traumatic compartment syndrome of  abdomen 12/14/2022   Exacerbation of Crohn's disease of large intestine (HCC) 12/13/2022   SBO (small bowel obstruction) (HCC) 12/13/2022   Cardiac arrest (HCC) 12/13/2022   History of abdominal subtotal colectomy 11/19/2022   Chronic disease anemia 11/19/2022   Small bowel obstruction (HCC) 11/19/2022   Protein-calorie malnutrition, moderate (HCC) 11/19/2022   Failure to thrive in adult 11/19/2022   Anorexia 11/19/2022   Nausea 11/19/2022   Abnormal CT scan, gastrointestinal tract 11/19/2022   Colonic stricture (HCC) 11/19/2022   Crohn's disease of both small and large intestine with intestinal obstruction (HCC)    History of Crohn's disease    Breast cancer (HCC) 11/09/2020   Port-A-Cath in place 07/15/2020   Hypokalemia 06/30/2020   Genetic testing 06/25/2020   Family history of breast cancer 06/11/2020   Malignant neoplasm of upper-outer quadrant of right breast in female, estrogen receptor positive (HCC) 06/04/2020   Abnormal finding present on diagnostic imaging of uterus 05/07/2019   Generalized abdominal pain    Vitamin D deficiency 09/24/2013   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 10/17/2010   PCP:  Ollen Bowl, MD Pharmacy:   [facility] [phone] - [place] Otto, [place] - [address] AT [facility] [address] [place] [address] Phone: [phone] Fax: [phone]     Social Determinants of Health (SDOH) Social History: SDOH Screenings   Food Insecurity: No Food Insecurity (11/21/2022)  Housing: Patient Unable To Answer (11/21/2022)  Transportation Needs: No Transportation Needs (11/21/2022)  Utilities: Not At Risk (11/21/2022)  Tobacco Use: Medium Risk (12/14/2022)   SDOH Interventions:     Readmission Risk Interventions    12/20/2022    3:50 PM 12/15/2022    2:43 PM  Readmission Risk Prevention Plan  Transportation Screening Complete Complete  PCP or Specialist Appt within 3-5 Days   Complete  HRI or Home Care Consult  Complete  Social Work Consult for Recovery Care Planning/Counseling  Complete  Palliative Care Screening  Complete  Medication Review Oceanographer) Complete Complete  PCP or Specialist appointment within 3-5 days of discharge Complete   HRI or Home Care Consult Complete   SW Recovery Care/Counseling Consult Complete   Palliative Care Screening Complete   Skilled Nursing Facility Not Applicable

## 2022-12-20 NOTE — Progress Notes (Signed)
PT refused nebulizer treatment- once medication placed in neb cup and RT attempted to put neb mask on PT face / head. RT asked PT if she preferred to hold neb or RT offered to hold it- PT refused again. RT provided education of benefits of medication. RN aware.

## 2022-12-20 NOTE — Progress Notes (Signed)
Nutrition Follow-up  DOCUMENTATION CODES:   Non-severe (moderate) malnutrition in context of chronic illness  INTERVENTION:   Monitor magnesium, potassium, and phosphorus for at least 3 days, MD to replete as needed, as pt is at risk for refeeding syndrome given malnutrition with severe weight loss over the past 4 months. -TPN management per Pharmacy - Continue  100mg  thiamine for at least 5 days. -Daily weights while on TPN - Will monitor for diet advancement.   -Continue trickle tube feeds today: Vital 1.2 at 62mL/hr  Provides 576 kcals, 36g protein and 389 ml H2O  - Concern for malabsorption issues as patient documented to only have ~100cm small bowel remaining. Suspect patient will need to be TPN dependent long term.   NUTRITION DIAGNOSIS:   Moderate Malnutrition related to chronic illness (Crohn's disease; metastatic breast cancer) as evidenced by moderate fat depletion, moderate muscle depletion, percent weight loss (24% in 4.5 months).  Ongoing.  GOAL:   Patient will meet greater than or equal to 90% of their needs  Meeting with TPN  MONITOR:   Labs, Weight trends, TF tolerance, I & O's, Diet advancement (TPN)  ASSESSMENT:   47 y.o. female with PMH of recurrent breast cancer not currently on treatment, Crohn's disease s/p subtotal colectomy in 2021, recurrent obstructions, colonic stricture, recent hospitalization 8/10-8/15/2024 for obstruction who presented on 12/12/2022 with abdominal pain, nausea, vomiting diarrhea.  9/4 Admit; found down in ED bathroom with no pulse, intubated 9/5 s/p ex-lap, found to have internal hernia with small bowel ischemia, only ~100cm of small bowel remaining 9/7 ex-lap, I&D, end ileostomy 9/8 TPN started at 57mL/hr 9/9 TPN increased to 47mL/hr 9/10 extubated, NGT removed but then replaced.  Trickle feeds of Vital AF 1.2 continue at 20 ml/hr.  Now on clears.   TPN continues at 70 ml/hr, providing 1737 kcals and 94g  protein.  Admission weight: 125 lbs Current weight: 140 lbs  Medications: Thiamine, KCl, K-Phos  Labs reviewed: CBGs: 114-155  TG 136  Diet Order:   Diet Order             Diet clear liquid Room service appropriate? Yes; Fluid consistency: Thin  Diet effective now                   EDUCATION NEEDS:   Education needs have been addressed  Skin:  Skin Assessment: Skin Integrity Issues: Skin Integrity Issues:: Incisions Incisions: Abdomen  Last BM:  9/11 -ileostomy  Height:   Ht Readings from Last 1 Encounters:  12/13/22 5\' 4"  (1.626 m)    Weight:   Wt Readings from Last 1 Encounters:  12/19/22 63.7 kg    BMI:  Body mass index is 24.11 kg/m.  Estimated Nutritional Needs:   Kcal:  1600-1800  Protein:  85-95 grams  Fluid:  1.6L/day   Tilda Franco, MS, RD, LDN Inpatient Clinical Dietitian Contact information available via Amion

## 2022-12-20 NOTE — Progress Notes (Signed)
ANTICOAGULATION CONSULT NOTE   Pharmacy Consult for heparin  Indication: pulmonary embolus  Allergies  Allergen Reactions   Nsaids Other (See Comments)    Crohns disease/ IBD    Patient Measurements: Height: 5\' 4"  (162.6 cm) Weight: 63.7 kg (140 lb 6.9 oz) IBW/kg (Calculated) : 54.7 Heparin Dosing Weight: 46.4 kg (admit wt on 8/28)  Vital Signs: Temp: 99.5 F (37.5 C) (09/11 1400) Temp Source: Bladder (09/11 0800) BP: 161/92 (09/11 1400) Pulse Rate: 125 (09/11 1400)  Labs: Recent Labs    12/18/22 0705 12/18/22 1000 12/18/22 1000 12/19/22 0502 12/20/22 0347 12/20/22 0355 12/20/22 1314  HGB  --  8.2*   < > 8.0* 7.8*  --   --   HCT  --  23.9*  --  24.0* 24.1*  --   --   PLT  --  52*  --  71* 82*  --   --   HEPARINUNFRC  --   --   --   --   --  0.19* 0.24*  CREATININE 1.80*  --   --  1.71* 1.67*  --   --    < > = values in this interval not displayed.    Estimated Creatinine Clearance: 36.7 mL/min (A) (by C-G formula based on SCr of 1.67 mg/dL (H)).  Medications:  Medications Prior to Admission  Medication Sig Dispense Refill Last Dose   acetaminophen (TYLENOL) 500 MG tablet Take 500-1,000 mg by mouth every 6 (six) hours as needed for mild pain.   12/12/2022   cholecalciferol (VITAMIN D3) 25 MCG (1000 UNIT) tablet Take 1,000 Units by mouth daily.   12/12/2022   hyoscyamine (LEVSIN SL) 0.125 MG SL tablet Place 1 tablet (0.125 mg total) under the tongue every 6 (six) hours as needed for cramping. 30 tablet 0 unknown   Multiple Vitamins-Minerals (MULTIVITAMIN WITH MINERALS) tablet Take 1 tablet by mouth daily.   Past Week   nystatin (MYCOSTATIN) 100000 UNIT/ML suspension Take 5 mLs (500,000 Units total) by mouth 4 (four) times daily. 473 mL 0 Past Month   potassium chloride SA (KLOR-CON M) 20 MEQ tablet Take 1 tablet (20 mEq total) by mouth daily. 90 tablet 2 12/12/2022   predniSONE (DELTASONE) 20 MG tablet Take 2 tablets (40 mg total) by mouth daily with breakfast for 7 days,  THEN 1.5 tablets (30 mg total) daily with breakfast for 7 days, THEN 1 tablet (20 mg total) daily with breakfast for 21 days. 45.5 tablet 0 12/12/2022   simethicone (MYLICON) 80 MG chewable tablet Chew 1 tablet (80 mg total) by mouth 4 (four) times daily as needed for flatulence. 30 tablet 0 unknown    Assessment: Pharmacy consulted to dose heparin for new LLL PE.   OR 9/5 s/p ex lap w/ massive SBR OR 9/7 OR ex lap, ileostomy, abd closure Per CCS OK to start heparin with no bolus.   Today, 12/20/22 Heparin level improved, but remains subtherapeutic on heparin at 1000 units/hr Hgb remains low but fairly stable; Plt low but improved Per RN no interruption in heparin infusion; no line issues. No bleeding complications noted SCr elevated but slowly improving  Goal of Therapy:  Heparin level 0.3-0.7 units/ml Monitor platelets by anticoagulation protocol: Yes   Plan:  No bolus per MD Increase heparin gtt to 1200 units/hr and check heparin level 6-8 hr after rate increase Daily CBC & heparin level F/u plans for long-term anticoagulation  Bernadene Person, PharmD, BCPS [phone] 12/20/2022, 2:27 PM

## 2022-12-20 NOTE — Progress Notes (Signed)
PHARMACY - TOTAL PARENTERAL NUTRITION CONSULT NOTE   Indication: Short bowel syndrome  Patient Measurements: Height: 5\' 4"  (162.6 cm) Weight: 63.7 kg (140 lb 6.9 oz) IBW/kg (Calculated) : 54.7   Body mass index is 24.11 kg/m. Usual Weight: 46.3 kg from 8/28  Assessment: 47 y.o. female with PMH of recurrent breast cancer not currently on treatment, Crohn's disease s/p subtotal colectomy in 2021, recurrent obstructions, colonic stricture, recent hospitalization 8/10-8/15/2024 for obstruction who presented on 12/12/2022 with abdominal pain, nausea, vomiting diarrhea.    9/4 Admit; found down in ED bathroom with no pulse, CPR > intubated 9/5 s/p ex-lap, found to have internal hernia with small bowel ischemia, only 105 cm of small bowel remaining OR 9/7 for closure, ostomy creation 9/8 pharmacy consulted to manage TPN  Glucose / Insulin: no Hx DM - steroids PTA for Crohn's disease, SoluCortef tapered to 50 mg daily - CBGs better controlled yesterday with weaning steroids - 3 units SSI given yesterday - no hypoglycemia noted Electrolytes: K remains borderline low and unchanged despite 40 mEq yesterday; phos improved to WNL but trending down; Ca trending to borderline low; others stable WNL Renal: AKI slowly resolving (baseline < 1.0); BUN elevated & rising; UOP remains robust w/o Lasix  Hepatic (9/10): LFTs/Bili now normalized after post-arrest insult - albumin low on admission and now remains < 1.5 - TG back to normal after stopping propofol I/O:  - Charting shows +21L since admission (likely d/t incomplete charting & insensible losses; weight did however increase by 7 kg since admit, more likely reflecting actual net fluid gain) - increasing NG output - 1400 ml from ostomy yesterday (expecting high output given short gut) - mIVF: NS @ 50 GI Imaging:  - CTA 9/10 shows new PE, but also abdominal ascites GI Surgeries / Procedures:  - OR 9/5 ex lap, internal hernia w/ small bowel  ischemia, SBR & celiotomy; > 105 cm small bowel remaining; - OR 9/7 for closure, ostomy creation Central access: triple lumen CVC R subclavian 9/4, triple lumen CVC R femoral 9/4;  PAC 05/02/22 TPN start date: 12/17/22  Nutritional Goals: - extubated & off propofol as of 9/10 - Updated RD recs: new goal TPN formulation at 70 mL/hr provides 95 g protein and 1737 kcal per day  RD Assessment: Estimated Needs Total Energy Estimated Needs: 1600-1800 Total Protein Estimated Needs: 85-95 grams Total Fluid Estimated Needs: 1.6L/day  Current Nutrition:  NPO  Plan:  KCl 10 mEq IV x 3 KPhos 15 mmol IV x 1   Continue TPN at 70 mL/hr at 1800 Electrolytes in TPN: no changes today likely not seeing full impact of K/Mg adjustments yet; can max out K/Mg on 9/12 if labs not improved Phos likely decreasing d/t refeeding which should wane over time, but can increase to 20 mmol/L on 9/12 if continues to deplete Na - 50 mEq/L K - 65 mEq/L Ca - 5 mEq/L Mg - 8 mEq/L Phos - 15 mmol/L Cl:Ac ratio - max Ac Add standard MVI and trace elements to TPN Receiving thiamine 100 mg x 5 days outside of TPN per RD recs Adjust SSI to moderate scale with q6 hr CBG checks Can likely wean off SSI in a few days if needs remain low Continue mIVF at 50 ml/hr; further adjustments per MD/CCM based on fluid status Monitor TPN labs on Mon/Thurs  Bernadene Person, PharmD, BCPS [phone] 12/20/2022, 9:04 AM

## 2022-12-20 NOTE — Plan of Care (Signed)
  Problem: Fluid Volume: Goal: Ability to maintain a balanced intake and output will improve Outcome: Progressing   Problem: Education: Goal: Knowledge of General Education information will improve Description: Including pain rating scale, medication(s)/side effects and non-pharmacologic comfort measures Outcome: Progressing   Problem: Metabolic: Goal: Ability to maintain appropriate glucose levels will improve Outcome: Not Progressing   Problem: Clinical Measurements: Goal: Respiratory complications will improve Outcome: Not Progressing Goal: Cardiovascular complication will be avoided Outcome: Not Progressing   Problem: Pain Managment: Goal: General experience of comfort will improve Outcome: Not Progressing

## 2022-12-20 NOTE — Progress Notes (Signed)
PT refused scheduled nebulizer treatment. PT shakes head "yes" when asked how is your breathing. RN aware. PT BBS diminished- clear, no respiratory distress noted, PT is now on RA with current Sp02 of 97%.

## 2022-12-21 DIAGNOSIS — R109 Unspecified abdominal pain: Secondary | ICD-10-CM

## 2022-12-21 DIAGNOSIS — K562 Volvulus: Secondary | ICD-10-CM

## 2022-12-21 LAB — COMPREHENSIVE METABOLIC PANEL
ALT: 28 U/L (ref 0–44)
AST: 32 U/L (ref 15–41)
Albumin: <1.5 g/dL — ABNORMAL LOW (ref 3.5–5.0)
Alkaline Phosphatase: 106 U/L (ref 38–126)
Anion gap: 5 (ref 5–15)
BUN: 51 mg/dL — ABNORMAL HIGH (ref 6–20)
CO2: 26 mmol/L (ref 22–32)
Calcium: 7.6 mg/dL — ABNORMAL LOW (ref 8.9–10.3)
Chloride: 109 mmol/L (ref 98–111)
Creatinine, Ser: 1.4 mg/dL — ABNORMAL HIGH (ref 0.44–1.00)
GFR, Estimated: 47 mL/min — ABNORMAL LOW (ref >60)
Glucose, Bld: 149 mg/dL — ABNORMAL HIGH (ref 70–99)
Potassium: 4.6 mmol/L (ref 3.5–5.1)
Sodium: 140 mmol/L (ref 135–145)
Total Bilirubin: 0.7 mg/dL (ref 0.3–1.2)
Total Protein: 4.2 g/dL — ABNORMAL LOW (ref 6.5–8.1)

## 2022-12-21 LAB — PHOSPHORUS: Phosphorus: 2.7 mg/dL (ref 2.5–4.6)

## 2022-12-21 LAB — GLUCOSE, CAPILLARY
Glucose-Capillary: 122 mg/dL — ABNORMAL HIGH (ref 70–99)
Glucose-Capillary: 127 mg/dL — ABNORMAL HIGH (ref 70–99)
Glucose-Capillary: 140 mg/dL — ABNORMAL HIGH (ref 70–99)
Glucose-Capillary: 115 mg/dL — ABNORMAL HIGH (ref 70–99)

## 2022-12-21 LAB — PREALBUMIN: Prealbumin: 19 mg/dL (ref 18–38)

## 2022-12-21 LAB — MAGNESIUM: Magnesium: 2 mg/dL (ref 1.7–2.4)

## 2022-12-21 LAB — HEMOGLOBIN AND HEMATOCRIT, BLOOD
HCT: 21.3 % — ABNORMAL LOW (ref 36.0–46.0)
HCT: 26.7 % — ABNORMAL LOW (ref 36.0–46.0)
HCT: 25.2 % — ABNORMAL LOW (ref 36.0–46.0)
HCT: 25.5 % — ABNORMAL LOW (ref 36.0–46.0)
HCT: 22.4 % — ABNORMAL LOW (ref 36.0–46.0)
Hemoglobin: 6.8 g/dL — CL (ref 12.0–15.0)
Hemoglobin: 8.9 g/dL — ABNORMAL LOW (ref 12.0–15.0)
Hemoglobin: 8.4 g/dL — ABNORMAL LOW (ref 12.0–15.0)
Hemoglobin: 8.5 g/dL — ABNORMAL LOW (ref 12.0–15.0)
Hemoglobin: 7.5 g/dL — ABNORMAL LOW (ref 12.0–15.0)

## 2022-12-21 LAB — CULTURE, RESPIRATORY W GRAM STAIN: Culture: NORMAL

## 2022-12-21 LAB — CBC
HCT: 22 % — ABNORMAL LOW (ref 36.0–46.0)
Hemoglobin: 7.1 g/dL — ABNORMAL LOW (ref 12.0–15.0)
MCH: 27.6 pg (ref 26.0–34.0)
MCHC: 32.3 g/dL (ref 30.0–36.0)
MCV: 85.6 fL (ref 80.0–100.0)
Platelets: 102 10*3/uL — ABNORMAL LOW (ref 150–400)
RBC: 2.57 MIL/uL — ABNORMAL LOW (ref 3.87–5.11)
RDW: 19.3 % — ABNORMAL HIGH (ref 11.5–15.5)
WBC: 25.1 10*3/uL — ABNORMAL HIGH (ref 4.0–10.5)
nRBC: 0 % (ref 0.0–0.2)

## 2022-12-21 LAB — HEPARIN LEVEL (UNFRACTIONATED): Heparin Unfractionated: 0.46 [IU]/mL (ref 0.30–0.70)

## 2022-12-21 LAB — LIPASE, BLOOD: Lipase: 762 U/L — ABNORMAL HIGH (ref 11–51)

## 2022-12-21 LAB — PREPARE RBC (CROSSMATCH)

## 2022-12-21 MED ORDER — ARTIFICIAL TEARS OPHTHALMIC OINT: TOPICAL_OINTMENT | OPHTHALMIC | Status: DC | PRN

## 2022-12-21 MED ORDER — METOPROLOL TARTRATE 5 MG/5ML IV SOLN
2.5000 mg | INTRAVENOUS | Status: DC | PRN
  Administered 2022-12-22 (×2): 5 mg via INTRAVENOUS
  Filled 2022-12-21 (×2): qty 5

## 2022-12-21 MED ORDER — GABAPENTIN 250 MG/5ML PO SOLN
100.0000 mg | Freq: Three times a day (TID) | ORAL | Status: DC
  Administered 2022-12-21 – 2022-12-23 (×6): 100 mg via ORAL
  Filled 2022-12-21 (×7): qty 2

## 2022-12-21 MED ORDER — OXYCODONE HCL 5 MG PO TABS
5.0000 mg | ORAL_TABLET | Freq: Four times a day (QID) | ORAL | Status: DC
  Administered 2022-12-21 (×2): 5 mg via ORAL
  Filled 2022-12-21 (×4): qty 1

## 2022-12-21 MED ORDER — LORAZEPAM 2 MG/ML IJ SOLN
1.0000 mg | Freq: Once | INTRAMUSCULAR | Status: AC
  Administered 2022-12-21: 1 mg via INTRAVENOUS
  Filled 2022-12-21: qty 1

## 2022-12-21 MED ORDER — ACETAMINOPHEN 160 MG/5ML PO SOLN
650.0000 mg | Freq: Four times a day (QID) | ORAL | Status: AC
  Administered 2022-12-21 – 2022-12-23 (×8): 650 mg
  Filled 2022-12-21 (×8): qty 20.3

## 2022-12-21 MED ORDER — METOPROLOL TARTRATE 12.5 MG HALF TABLET: 12.5000 mg | ORAL_TABLET | Freq: Once | ORAL | Status: DC

## 2022-12-21 MED ORDER — ACETAMINOPHEN 160 MG/5ML PO SOLN: 650.0000 mg | Freq: Four times a day (QID) | ORAL | Status: DC

## 2022-12-21 MED ORDER — TRAVASOL 10 % IV SOLN
INTRAVENOUS | Status: AC
  Filled 2022-12-21: qty 940.8

## 2022-12-21 MED ORDER — MAGNESIUM SULFATE 2 GM/50ML IV SOLN: 2.0000 g | Freq: Once | INTRAVENOUS | Status: DC

## 2022-12-21 MED ORDER — METOPROLOL TARTRATE 5 MG/5ML IV SOLN
5.0000 mg | INTRAVENOUS | Status: DC | PRN
  Administered 2022-12-21: 5 mg via INTRAVENOUS
  Filled 2022-12-21: qty 5

## 2022-12-21 MED ORDER — METOPROLOL TARTRATE 25 MG PO TABS
25.0000 mg | ORAL_TABLET | Freq: Two times a day (BID) | ORAL | Status: DC
  Administered 2022-12-21 – 2022-12-27 (×12): 25 mg via ORAL
  Filled 2022-12-21 (×12): qty 1

## 2022-12-21 MED ORDER — LORAZEPAM 0.5 MG PO TABS
0.5000 mg | ORAL_TABLET | Freq: Four times a day (QID) | ORAL | Status: DC | PRN
  Administered 2022-12-21 – 2023-01-01 (×2): 0.5 mg via ORAL
  Filled 2022-12-21 (×2): qty 1

## 2022-12-21 MED ORDER — SODIUM CHLORIDE 0.9% IV SOLUTION: Freq: Once | INTRAVENOUS | Status: AC

## 2022-12-21 MED ORDER — POTASSIUM CHLORIDE 10 MEQ/50ML IV SOLN
10.0000 meq | INTRAVENOUS | Status: DC
Start: 2022-12-21 — End: 2022-12-21

## 2022-12-21 MED ORDER — SILVER NITRATE-POT NITRATE 75-25 % EX MISC
1.0000 | CUTANEOUS | Status: DC | PRN
  Administered 2022-12-21: 7 via TOPICAL
  Filled 2022-12-21 (×2): qty 1

## 2022-12-21 NOTE — Progress Notes (Signed)
PHARMACY - TOTAL PARENTERAL NUTRITION CONSULT NOTE   Indication: Short bowel syndrome  Patient Measurements: Height: 5\' 4"  (162.6 cm) Weight: 65.1 kg (143 lb 8.3 oz) IBW/kg (Calculated) : 54.7   Body mass index is 24.64 kg/m. Usual Weight: 46.3 kg from 8/28  Assessment: 47 y.o. female with PMH of recurrent breast cancer not currently on treatment, Crohn's disease s/p subtotal colectomy in 2021, recurrent obstructions, colonic stricture, recent hospitalization 8/10-8/15/2024 for obstruction who presented on 12/12/2022 with abdominal pain, nausea, vomiting diarrhea.    9/4 Admit; found down in ED bathroom with no pulse, CPR > intubated 9/5 s/p ex-lap, found to have internal hernia with small bowel ischemia, only 105 cm of small bowel remaining OR 9/7 for closure, ostomy creation 9/8 pharmacy consulted to manage TPN  Glucose / Insulin: no Hx DM - steroids PTA for Crohn's disease, currently on SoluCortef 50 mg daily - CBGs at goal < 180 - 9 units SSI given yesterday - no hypoglycemia noted Electrolytes: K, phos and mag now WNL  Renal: AKI slowly resolving (baseline < 1.0); BUN elevated & rising; UOP remains robust w/o Lasix  Hepatic (9/10): LFTs/Bili now normalized after post-arrest insult - albumin low on admission and now remains < 1.5 - TG back to normal after stopping propofol I/O:  - Charting shows +21L since admission (likely d/t incomplete charting & insensible losses; weight did however increase by 8 kg since admit, more likely reflecting actual net fluid gain) - 1800 ml from ostomy yesterday (expecting high output given short gut) - mIVF: NS @ 50 GI Imaging:  - CTA 9/10 shows new PE, but also abdominal ascites GI Surgeries / Procedures:  - OR 9/5 ex lap, internal hernia w/ small bowel ischemia, SBR & celiotomy; > 105 cm small bowel remaining; - OR 9/7 for closure, ostomy creation Central access: triple lumen CVC R subclavian 9/4, triple lumen CVC R femoral 9/4;  PAC  05/02/22 TPN start date: 12/17/22  Nutritional Goals: - extubated & off propofol as of 9/10 - Updated RD recs: new goal TPN formulation at 70 mL/hr provides 95 g protein and 1737 kcal per day  RD Assessment: Estimated Needs Total Energy Estimated Needs: 1600-1800 Total Protein Estimated Needs: 85-95 grams Total Fluid Estimated Needs: 1.6L/day  Current Nutrition:  NPO TF at 20 ml/hr, unsure how much would be absorbing  Plan: Continue TPN at 70 mL/hr at 1800 Electrolytes in TPN:  Na - 50 mEq/L K - 60 mEq/L Ca - 5 mEq/L Mg - 10 mEq/L Phos - 15 mmol/L Cl:Ac ratio - max Ac Add standard MVI and trace elements to TPN Receiving thiamine 100 mg x 5 days outside of TPN per RD recs Adjust SSI to moderate scale with q6 hr CBG checks Can likely wean off SSI in a few days if needs remain low Continue mIVF at 50 ml/hr; further adjustments per MD/CCM based on fluid status Monitor TPN labs on Mon/Thurs    Hessie Knows, PharmD, BCPS Secure Chat if ?s 12/21/2022 8:40 AM

## 2022-12-21 NOTE — Progress Notes (Signed)
NAME:  [name], MRN:  [id number], DOB:  [birth year], LOS: 8 ADMISSION DATE:  12/12/2022, CONSULTATION DATE:  12/13/2022 REFERRING MD:  [name] - EDP CHIEF COMPLAINT:  Cardiac arrest    History of Present Illness:  47 year old female with past medical history of recurrent breast cancer not currently on treatment, Crohn's disease s/p subtotal colectomy in 2021 recurrent obstructions, colonic stricture, IDA, recent hospitalization 8/10-8/15/2024 for obstruction seen by GI and general surgery. Underwent EGD and flexible sigmoidoscopy and discharged on steroids who presented to the emergency department on 12/12/2022 with abdominal pain, nausea, vomiting diarrhea. In the ED tachycardic, hypertensive. ED provider notes abdominal distention. She was given 2L IVF, morphine, IV PPI. Labs with WBC 10.8, Hgb 10.7, K 3.2. CT AP was ordered and showed markedly dilated distal small bowel consistent with chronic obstruction. No changes since prior study. Patient was admitted to [facility]. They consulted GI for input. Unfortunately, around 0820 this morning patient was found on the floor in the bathroom in ED pulseless. They estimate downtime around 5 minutes. ACLS was initiated. Concern for possible torsades de pointes and given IV mag, defibrillated. Coded for around 5 minutes. She was intubated. She had copious vomitus in the ETT. ROSC achieved and ICU was consulted. Notably, she had I-stat peri-code which showed hemoglobin of 5.5 down from 10 overnight.   Pertinent Medical History:  Crohn's disease with previous bowel obstruction s/p subtotal colectomy 2021, colonic stricture, IDA, breast cancer s/p mastectomy 2022  Significant Hospital Events: Including procedures, antibiotic start and stop dates in addition to other pertinent events   9/3: Admitted to [facility] for abdominal distention, presumed Crohn's exacerbation 9/4: ~0820 Cardiac arrest in ED. Admitted to ICU intubated, sedated. Hgb 5.5. Placed right femoral CVC.  Received 3 UPRBC, 3 FFP. 9/5: - Hgb stable at 11.7 after 3u PRBC, 3 FPP 9/5 overnight increasing pressor requirements - added vaso. Bladder pressures 28, went for exploratory lap w/ sb resxn, and celiotomy, intra op findings: internal hernia w/ associated small bowel ischemia. Also seen by GI and palliative. Full scope of care desired  9/6 Some ST changes in lateral leads. Weaning pressors. Removing femoral line and placing internal jugular. Renal fxn worse, on-going metabolic acidosis.  9/7: Back to OR for ostomy placement.  9/8: Off levo 9/9: Weaning sedation. Following commands. Tried PS of 5/5 which Vt did not hold up. Giving IV lasix for anasarca, she's 20+ L up on admission. Will try SBT again this afternoon.  9/10: Extubated, +PE on heparin gtt 9/11: Saturation of abdominal dressing and Hgb drift to 6.8 requiring blood transfusion (1U PRBCs). Heparin gtt paused.  Interim History / Subjective:  Overnight, noted to have bleeding from midline abdominal wound + oozing from various sources (nares due to NGT, central access site) Hgb drift to 6.8 requiring blood 1U PRBCs given this AM I am concerned about patient - very withdrawn, minimally participatory in care, refusing care in many instances Tachy to 130s-150s, hypertensive Claiming she does not have pain, I am concerned this is not the case Will optimize pain regimen as able  Objective:  Blood pressure (!) 164/108, pulse (!) 159, temperature 99.5 F (37.5 C), resp. rate (!) 31, height 5\' 4"  (1.626 m), weight 65.1 kg, last menstrual period 04/24/2021, SpO2 99%.        Intake/Output Summary (Last 24 hours) at 12/21/2022 0845 Last data filed at 12/21/2022 1478 Gross per 24 hour  Intake 3355.3 ml  Output 2950 ml  Net 405.3 ml  Filed Weights   12/17/22 0419 12/19/22 0500 12/21/22 0500  Weight: 65.8 kg 63.7 kg 65.1 kg   Physical Examination: General: Acutely ill-appearing middle-aged woman in NAD. Appears very withdrawn, awake but  minimally participating in care. HEENT: [place]/AT, anicteric sclera, PERRL, moist mucous membranes. Nares with oozing of blood 2/2 NGT. Neuro:  Awake, appears oriented, withdrawn.  Responds to verbal stimuli. Following commands intermittently. Moves all 4 extremities spontaneously. Generalized weakness.  CV: Tachycardic to 130s, regular rhythm, no m/g/r. PULM: Breathing tachypneic, shallow but unlabored on 2LNC. Lung fields diminished at bilateral bases. GI: Soft, appropriately TTP, mildly distended. +Ileostomy in place with mucous-containing reddish-brown output. Hypoactive bowel sounds. Midline abdominal wound with oozing, fascia closed/skin open. Extremities: Bilateral symmetric 1-2+ UE/LE edema noted. Skin: Warm/dry, no rashes.  Resolved Hospital Problem List:  Cardiac arrest  Shock Shock liver Bradycardia  Acute hypoxic respiratory failure   Assessment & Plan:  Sepsis, likely secondary to bacterial translocation from ischemic gut Leukocytosis - Goal MAP > 65 - Fluid resuscitation as tolerated - Off of pressors since 9/8 - Trend WBC, fever curve - F/u Cx data - Continue broad-spectrum antibiotics (Zosyn)  Small bowel obstruction secondary to internal hernia and resultant ischemic gut status post exploratory laparotomy 9/5 S/p ex-lap and ileostomy creation 9/7 - CCS following, appreciate recommendations - NGT for trickle feeds - TPN per pharmacy/RD, appreciate assistance - Multimodal pain management (APAP scheduled, gabapentin scheduled, oxycodone scheduled, PRN medications) - Ostomy care per protocol  Pulmonary Embolism; LLL segmental PE  Echo 9/11 with EF 60-65%, normal RV function/size, no e/o R heart strain. BLE Dopplers negative for DVT. - Heparin gtt paused in the setting of active bleeding, Hgb drift to 6.8 requiring transfusion - Resume when clinically appropriate  Elevated Lipase; unlikely propofol related. She did have low grade temp yesterday. 208 -> 650 -> 762 -  Trend, low threshold for repeat abdominal imaging - May need to limit lipids in TPN if worsening  Tachycardia ST elevation in the lateral leads Unclear etiology. She does have acute PE but it is subsegmental. Felt likely not volume related. No evidence of bleed. Not febrile. No thyroid history. Does not take rate control medication at home. Doubt withdrawal from Versed gtt.  - Echo 9/11 WNL - Repeat EKG, ensure ST - Cardiac monitoring - Optimize electrolytes for K > 4, Mg > 2 - Metoprolol BID  Hypertension; no history of hypertension Not on antihypertensives at home. Could be pain, anxiety, acute illness related. - Metoprolol BID  AKI, likely ATN in the setting of sepsis and end-organ hypoperfusion - Trend BMP - Replete electrolytes as indicated - Monitor I&Os - Avoid nephrotoxic agents as able - Ensure adequate renal perfusion  Acute-on-chronic anemia Thrombocytopenia Likely multifactorial in the setting of postoperative status, heparin gtt for PE. - Trend H&H, Plt - Monitor for signs of active bleeding, abdominal wound bleeding noted 9/11PM-9/12AM - Transfuse for Hgb < 7.0 or hemodynamically significant bleeding - 1U PRBCs given 9/12AM - Heparin gtt paused in the setting of active bleeding  Hyperglycemia - SSI - CBGs Q4H - Goal CBG 140-180  Recurrent breast cancer Not currently on treatment; per Dr. Pamelia Hoit note 10/11/22, patient has declined further treatment at this time.  - Outpatient f/u scheduled for 12/2022 for anti-estrogen therapy  Suspected depression, anxiousness Patient is extremely withdrawn today 9/12 and unwilling to participate in care, refusing multiple instances of care. I am concerned about her mental status and the weight of this hospitalization/multiple surgeries on the background  of recurrent breast CA. - Anxiolytics PRN - Consider Psych consult for support - PMT following peripherally - Spiritual support via chaplain  Best Practice (right click and  "Reselect all SmartList Selections" daily)   Diet/type: tubefeeds and TPN DVT prophylaxis: systemic heparin  GI prophylaxis: PPI Lines: Central line and yes and it is still needed Foley:  Yes, and it is still needed Code Status:  full code Last date of multidisciplinary goals of care discussion: Answered all questions for family at bedside at this time.  Critical care time:   The patient is critically ill with multiple organ system failure and requires high complexity decision making for assessment and support, frequent evaluation and titration of therapies, advanced monitoring, review of radiographic studies and interpretation of complex data.   Critical Care Time devoted to patient care services, exclusive of separately billable procedures, described in this note is 41 minutes.  Tim Lair, PA-C [facility] 12/21/22 8:45 AM  Please see [email or web address] for pager details. From 7A-7P if no response, please call [phone] After hours, please call ELink [phone]

## 2022-12-21 NOTE — Progress Notes (Addendum)
eLink Physician-Brief Progress Note Patient Name: [name] DOB: [birth year] MRN: [id number]   Date of Service  12/21/2022  HPI/Events of Note  Patient with hemoglobin of 6.8 gm / dl and bleeding from laparotomy wound, she gave consent for blood transfusion but blood bank refusing to release blood secondary to prior "no Transfusion" status.  eICU Interventions  I signed out to Dr. Wynona Neat to address blood transfusion issue with blood bank.        Migdalia Dk 12/21/2022, 6:49 AM

## 2022-12-21 NOTE — Progress Notes (Signed)
PT refusing most care since my arrival this morning. Patients hands had to be held by nurses so the surgery PA could cauterize abdominal wound that was actively bleeding from three places. PT has denied pain and does not want more than tylenol despite physical clues of being in severe pain. She is guarding especially when trying to visualize wound for bleeding and started trying to pull at her port when nurse was attempting to collect blood. BP elevated, HR as high as 170's with respirations as high as 48 during most agitated period. Medications for pain scheduled by provider, anxiety medication to be tried for effectiveness.

## 2022-12-21 NOTE — Progress Notes (Signed)
ANTICOAGULATION CONSULT NOTE   Pharmacy Consult for heparin  Indication: pulmonary embolus  Allergies  Allergen Reactions   Nsaids Other (See Comments)    Crohns disease/ IBD    Patient Measurements: Height: 5\' 4"  (162.6 cm) Weight: 65.1 kg (143 lb 8.3 oz) IBW/kg (Calculated) : 54.7 Heparin Dosing Weight: 46.4 kg (admit wt on 8/28)  Vital Signs: Temp: 99.5 F (37.5 C) (09/12 0800) Temp Source: Bladder (09/12 0717) BP: 164/108 (09/12 0800) Pulse Rate: 159 (09/12 0800)  Labs: Recent Labs    12/19/22 0502 12/20/22 0347 12/20/22 0355 12/20/22 1314 12/20/22 2243 12/21/22 0305 12/21/22 0435  HGB 8.0* 7.8*  --   --   --  7.1* 6.8*  HCT 24.0* 24.1*  --   --   --  22.0* 21.3*  PLT 71* 82*  --   --   --  102*  --   HEPARINUNFRC  --   --    < > 0.24* 0.41 0.46  --   CREATININE 1.71* 1.67*  --   --   --   --   --    < > = values in this interval not displayed.    Estimated Creatinine Clearance: 36.7 mL/min (A) (by C-G formula based on SCr of 1.67 mg/dL (H)).  Medications:  Medications Prior to Admission  Medication Sig Dispense Refill Last Dose   acetaminophen (TYLENOL) 500 MG tablet Take 500-1,000 mg by mouth every 6 (six) hours as needed for mild pain.   12/12/2022   cholecalciferol (VITAMIN D3) 25 MCG (1000 UNIT) tablet Take 1,000 Units by mouth daily.   12/12/2022   hyoscyamine (LEVSIN SL) 0.125 MG SL tablet Place 1 tablet (0.125 mg total) under the tongue every 6 (six) hours as needed for cramping. 30 tablet 0 unknown   Multiple Vitamins-Minerals (MULTIVITAMIN WITH MINERALS) tablet Take 1 tablet by mouth daily.   Past Week   nystatin (MYCOSTATIN) 100000 UNIT/ML suspension Take 5 mLs (500,000 Units total) by mouth 4 (four) times daily. 473 mL 0 Past Month   potassium chloride SA (KLOR-CON M) 20 MEQ tablet Take 1 tablet (20 mEq total) by mouth daily. 90 tablet 2 12/12/2022   predniSONE (DELTASONE) 20 MG tablet Take 2 tablets (40 mg total) by mouth daily with breakfast for 7  days, THEN 1.5 tablets (30 mg total) daily with breakfast for 7 days, THEN 1 tablet (20 mg total) daily with breakfast for 21 days. 45.5 tablet 0 12/12/2022   simethicone (MYLICON) 80 MG chewable tablet Chew 1 tablet (80 mg total) by mouth 4 (four) times daily as needed for flatulence. 30 tablet 0 unknown    Assessment: Pharmacy consulted to dose heparin for new LLL PE.   OR 9/5 s/p ex lap w/ massive SBR OR 9/7 OR ex lap, ileostomy, abd closure Per CCS OK to start heparin with no bolus.   Today, 12/21/22 Heparin level therapeutic this AM Plts improved Hgb dropping - blood transfusion ordered Per RN, blood was in ostomy  Goal of Therapy:  Heparin level 0.3-0.7 units/ml Monitor platelets by anticoagulation protocol: Yes   Plan:  Due to drop in Hgb and blood in ostomy, heparin was turned off at 0726 Daily CBC & heparin level F/u plans for UFH restart and long term anticoagulation plans   Hessie Knows, PharmD, BCPS Secure Chat if ?s 12/21/2022 8:37 AM

## 2022-12-21 NOTE — Progress Notes (Signed)
PT Cancellation Note  Patient Details Name: [name] MRN: [id number] DOB: [birth year]   Cancelled Treatment:    Reason Eval/Treat Not Completed: Medical issues which prohibited therapy, bleeding from wound, low HGB.  Blanchard Kelch PT Acute Rehabilitation Services Office [phone] Weekend pager-[phone]    [name] 12/21/2022, 7:27 AM

## 2022-12-21 NOTE — Evaluation (Signed)
Clinical/Bedside Swallow Evaluation Patient Details  Name: KAYDEE BELTRE MRN: [id number] Date of Birth: Aug 06, 1975  Today's Date: 12/21/2022 Time: SLP Start Time (ACUTE ONLY): 1137 SLP Stop Time (ACUTE ONLY): 1201 SLP Time Calculation (min) (ACUTE ONLY): 24 min  Past Medical History:  Past Medical History:  Diagnosis Date   Breast cancer (HCC)    right   Colon stricture (HCC) 05/07/2019   Crohn's colitis, with intestinal obstruction (HCC) 09/17/2013   Dx 2006 - right and left colon involved 2011 - pan-colitis 09/17/2013 left colitis 60-20 cm March 2021 subtotal colectomy ileosigmoid anastomosis for colonic strictures-Dr. Maisie Fus     Crohn's disease [facility])    Family history of breast cancer 06/11/2020   History of abdominal subtotal colectomy 11/19/2022   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 10/17/2010   Rectal bleeding    Uterine fibroid    Vitamin D deficiency 09/24/2013   Past Surgical History:  Past Surgical History:  Procedure Laterality Date   AXILLARY LYMPH NODE DISSECTION Right 11/09/2020   Procedure: RIGHT AXILLARY LYMPH NODE DISSECTION;  Surgeon: Emelia Loron, MD;  Location: [facility];  Service: General;  Laterality: Right;   BIOPSY  07/01/2020   Procedure: BIOPSY;  Surgeon: Benancio Deeds, MD;  Location: [facility];  Service: Gastroenterology;;   BIOPSY  01/27/2021   Procedure: BIOPSY;  Surgeon: Benancio Deeds, MD;  Location: [facility];  Service: Gastroenterology;;   BIOPSY  09/07/2021   Procedure: BIOPSY;  Surgeon: Lynann Bologna, MD;  Location: [facility];  Service: Gastroenterology;;   BIOPSY  11/21/2022   Procedure: BIOPSY;  Surgeon: Lemar Lofty., MD;  Location: [facility];  Service: Gastroenterology;;   BREAST BIOPSY Right 03/24/2022   [place] RT BREAST BX W LOC DEV 1ST LESION IMG BX SPEC US GUIDE 03/24/2022 [facility]   BREAST BIOPSY  04/28/2022   [place] RT RADIOACTIVE SEED LOC  04/28/2022 [facility]   BREAST IMPLANT REMOVAL Right 08/07/2022   Procedure: REMOVAL  OF RIGHT BREAST IMPLANTS;  Surgeon: Allena Napoleon, MD;  Location: [facility];  Service: Plastics;  Laterality: Right;   BREAST RECONSTRUCTION WITH PLACEMENT OF TISSUE EXPANDER AND FLEX HD (ACELLULAR HYDRATED DERMIS) Bilateral 11/09/2020   Procedure: BILATERAL BREAST RECONSTRUCTION WITH PLACEMENT OF TISSUE EXPANDER AND FLEX HD (ACELLULAR HYDRATED DERMIS);  Surgeon: Allena Napoleon, MD;  Location: [facility];  Service: Plastics;  Laterality: Bilateral;   CAPSULOTOMY Bilateral 07/25/2021   Procedure: CAPSULOTOMY;  Surgeon: Allena Napoleon, MD;  Location: [facility];  Service: Plastics;  Laterality: Bilateral;   COLECTOMY  06/27/2019   COLONOSCOPY  2015   ESOPHAGOGASTRODUODENOSCOPY (EGD) WITH PROPOFOL N/A 11/21/2022   Procedure: ESOPHAGOGASTRODUODENOSCOPY (EGD) WITH PROPOFOL;  Surgeon: Lemar Lofty., MD;  Location: [facility];  Service: Gastroenterology;  Laterality: N/A;   FLEXIBLE SIGMOIDOSCOPY N/A 07/01/2020   Procedure: FLEXIBLE SIGMOIDOSCOPY;  Surgeon: Benancio Deeds, MD;  Location: [facility];  Service: Gastroenterology;  Laterality: N/A;   FLEXIBLE SIGMOIDOSCOPY N/A 01/27/2021   Procedure: FLEXIBLE SIGMOIDOSCOPY;  Surgeon: Benancio Deeds, MD;  Location: [facility];  Service: Gastroenterology;  Laterality: N/A;   FLEXIBLE SIGMOIDOSCOPY N/A 09/07/2021   Procedure: FLEXIBLE SIGMOIDOSCOPY;  Surgeon: Lynann Bologna, MD;  Location: [facility];  Service: Gastroenterology;  Laterality: N/A;   FLEXIBLE SIGMOIDOSCOPY N/A 11/21/2022   Procedure: FLEXIBLE SIGMOIDOSCOPY;  Surgeon: Meridee Score Netty Starring., MD;  Location: [facility];  Service: Gastroenterology;  Laterality: N/A;   LAPAROTOMY N/A 12/14/2022  Procedure: EXPLORATORY LAPAROTOMY WITH SMALL BOWEL RESECTION;  Surgeon: Moise Boring, MD;  Location: [facility];  Service: General;   Laterality: N/A;   LAPAROTOMY N/A 12/16/2022   Procedure: REOPENING LAPAROTOMY, ILEOSTOMY;  ABDOMINAL CLOSURE;  Surgeon: Moise Boring, MD;  Location: [facility];  Service: General;  Laterality: N/A;   NIPPLE SPARING MASTECTOMY Bilateral 11/09/2020   Procedure: BILATERAL NIPPLE SPARING MASTECTOMY;  Surgeon: Emelia Loron, MD;  Location: [facility];  Service: General;  Laterality: Bilateral;   PORT-A-CATH REMOVAL N/A 07/25/2021   Procedure: REMOVAL PORT-A-CATH;  Surgeon: Allena Napoleon, MD;  Location: [facility];  Service: Plastics;  Laterality: N/A;   PORTACATH PLACEMENT N/A 06/23/2020   Procedure: INSERTION PORT-A-CATH;  Surgeon: Emelia Loron, MD;  Location: [facility];  Service: General;  Laterality: N/A;  START TIME OF 11:00 AM FOR 60 MINUTES [place] [facility]   PORTACATH PLACEMENT Left 05/02/2022   Procedure: INSERTION PORT-A-CATH;  Surgeon: Emelia Loron, MD;  Location: [facility];  Service: General;  Laterality: Left;   RADIOACTIVE SEED GUIDED EXCISIONAL BREAST BIOPSY Right 05/02/2022   Procedure: RADIOACTIVE SEED GUIDED RIGHT BREAST MASS EXCISION;  Surgeon: Emelia Loron, MD;  Location: [facility];  Service: General;  Laterality: Right;   REMOVAL OF BILATERAL TISSUE EXPANDERS WITH PLACEMENT OF BILATERAL BREAST IMPLANTS Bilateral 07/25/2021   Procedure: REMOVAL OF BILATERAL TISSUE EXPANDERS WITH PLACEMENT OF BILATERAL BREAST IMPLANTS;  Surgeon: Allena Napoleon, MD;  Location: [facility];  Service: Plastics;  Laterality: Bilateral;  1.5   HPI:  Per CCM note "47 year old female with past medical history of recurrent breast cancer not currently on treatment, Crohn's disease s/p subtotal colectomy in 2021 recurrent obstructions, colonic stricture, IDA, recent hospitalization 8/10-8/15/2024 for obstruction seen by GI and general surgery. Underwent EGD and flexible sigmoidoscopy and discharged on  steroids who presented to the emergency department on 12/12/2022 with abdominal pain, nausea, vomiting diarrhea. In the ED tachycardic, hypertensive. ED provider notes abdominal distention. She was given 2L IVF, morphine, IV PPI. Labs with WBC 10.8, Hgb 10.7, K 3.2. CT AP was ordered and showed markedly dilated distal small bowel consistent with chronic obstruction. No changes since prior study. Patient was admitted to [facility]. They consulted GI for input. Unfortunately, around 0820 9/4 patient was found on the floor in the bathroom in ED pulseless. They estimate downtime around 5 minutes. ACLS was initiated. Concern for possible torsades de pointes and given IV mag, defibrillated. Coded for around 5 minutes. She was intubated. She had copious vomitus in the ETT. ROSC achieved and ICU was consulted. Notably, she had I-stat peri-code which showed hemoglobin of 5.5 down from 10 overnight."  Pt was extubated 12/18/2021.    Assessment / Plan / Recommendation  Clinical Impression  Ms Coney participated minimally during SLP session repeatedly shaking her head no when SLP initially attempted to do oral motor exam and provide trials of p.o.  However after sister Annice Pih Face Timed with her daughter Annice Pih requested SLP return to patient's room for evaluation.  Patient today is nearly completely aphonic on occasion she does have minimal momentary voicing less than 0.5 seconds and she has grossly impaired cough.  No focal cranial nerve deficits noted.  Patient did accept a few single ice chips with delayed multiple swallows but no overt indication of aspiration.  Beyond this she declined to consume any more intake.  At this time due to multiple risk factors including those listed previously, SLP recommends  patient received small single ice chips only and teaspoons of thin water.  Her near aphonia increases risk of inadequate airway protection with po.  Will follow-up next date with hopeful improvement of swallowing as patient  medically stabilizes and potential edema pharyngeal and laryngeal diminishes.  Educated patient, RN, and patient's sister to recommendations using teach back.  Patient is nutritionally supported via TPN. SLP Visit Diagnosis: Dysphagia, unspecified (R13.10);Dysphagia, pharyngeal phase (R13.13)    Aspiration Risk       Diet Recommendation NPO (single ice chips, tsps of water only)    Liquid Administration via: Spoon Medication Administration: Via alternative means Supervision: Full supervision/cueing for compensatory strategies Compensations: Slow rate;Small sips/bites Postural Changes: Remain upright for at least 30 minutes after po intake;Seated upright at 90 degrees    Other  Recommendations Oral Care Recommendations: Oral care prior to ice chip/H20    Recommendations for follow up therapy are one component of a multi-disciplinary discharge planning process, led by the attending physician.  Recommendations may be updated based on patient status, additional functional criteria and insurance authorization.  Follow up Recommendations    TBD    Assistance Recommended at Discharge    Functional Status Assessment    Frequency and Duration   Min X1 /week         Prognosis Prognosis for improved oropharyngeal function: Fair Barriers to Reach Goals: Motivation      Swallow Study   General Date of Onset: 12/21/22 HPI: Per CCM note "47 year old female with past medical history of recurrent breast cancer not currently on treatment, Crohn's disease s/p subtotal colectomy in 2021 recurrent obstructions, colonic stricture, IDA, recent hospitalization 8/10-8/15/2024 for obstruction seen by GI and general surgery. Underwent EGD and flexible sigmoidoscopy and discharged on steroids who presented to the emergency department on 12/12/2022 with abdominal pain, nausea, vomiting diarrhea. In the ED tachycardic, hypertensive. ED provider notes abdominal distention. She was given 2L IVF, morphine, IV PPI.  Labs with WBC 10.8, Hgb 10.7, K 3.2. CT AP was ordered and showed markedly dilated distal small bowel consistent with chronic obstruction. No changes since prior study. Patient was admitted to [facility] They consulted GI for input. Unfortunately, around 0820 9/4 patient was found on the floor in the bathroom in ED pulseless. They estimate downtime around 5 minutes. ACLS was initiated. Concern for possible torsades de pointes and given IV mag, defibrillated. Coded for around 5 minutes. She was intubated. She had copious vomitus in the ETT. ROSC achieved and ICU was consulted. Notably, she had I-stat peri-code which showed hemoglobin of 5.5 down from 10 overnight."  Pt was extubated 12/18/2021. Type of Study: Bedside Swallow Evaluation Diet Prior to this Study: Thin liquids (Level 0) (clears) Temperature Spikes Noted: No Respiratory Status: Room air History of Recent Intubation: No Behavior/Cognition: Alert;Cooperative Oral Cavity Assessment: Within Functional Limits Oral Care Completed by SLP: No Oral Cavity - Dentition: Adequate natural dentition Vision: Impaired for self-feeding Self-Feeding Abilities: Able to feed self Patient Positioning: Upright in bed Baseline Vocal Quality: Aphonic;Suspected CN X (Vagus) involvement Volitional Cough: Weak Volitional Swallow: Unable to elicit    Oral/Motor/Sensory Function Overall Oral Motor/Sensory Function: Generalized oral weakness   Ice Chips Ice chips: Impaired Presentation: Spoon;Self Fed Pharyngeal Phase Impairments: Suspected delayed Swallow   Thin Liquid Thin Liquid: Not tested Other Comments: pt refused to take any liquids when offered via straw or tsp - despite encouragement from her sister in person and daughter on the phone    Nectar Thick Nectar Thick Liquid:  Not tested   Honey Thick Honey Thick Liquid: Not tested   Puree Puree: Not tested   Solid     Solid: Not tested      Chales Abrahams 12/21/2022,12:12 PM   Rolena Infante, MS [facility]  SLP Acute Rehab Services Office [phone]

## 2022-12-21 NOTE — Progress Notes (Signed)
Patient ID: [name], female   DOB: 08/31/1975, 47 y.o.   MRN: [id number] [facility] Progress Note  5 Days Post-Op  Subjective: CC-  Some bleeding from midline wound over night. Hgb 6.8 from 7.1, getting 1u PRBCs. Heparin gtt held this morning. She has been hypertensive and tachycardic. Only taking tylenol for pain. Family at bedside state she did not sleep last night due to pain. Ileostomy with 1350cc out last 24 hours.  Objective: Vital signs in last 24 hours: Temp:  [99 F (37.2 C)-99.9 F (37.7 C)] 99.5 F (37.5 C) (09/12 0800) Pulse Rate:  [118-160] 159 (09/12 0800) Resp:  [24-38] 31 (09/12 0800) BP: (123-173)/(85-116) 164/108 (09/12 0800) SpO2:  [95 %-100 %] 99 % (09/12 0800) Weight:  [65.1 kg] 65.1 kg (09/12 0500) Last BM Date : 12/20/22  Intake/Output from previous day: 09/11 0701 - 09/12 0700 In: 3292.1 [I.V.:2363.3; NG/GT:442.3; IV Piggyback:486.4] Out: 2950 [Urine:1600; Stool:1350] Intake/Output this shift: Total I/O In: 711.2 [I.V.:443.9; Blood:160; NG/GT:107.3] Out: -   PE: Gen: Alert, NAD Card:  tachy Pulm: Labored respirations Abd: soft, ND, appropriately tender.  Midline wound is clean and fascia is intact with serous drainage from lower aspect of the incision; multiple spots of oozing from skin edge at superior and inferior portions of wound.  Stoma is pink and viable with thin liquid stool in pouch  Lab Results:  Recent Labs    12/20/22 0347 12/21/22 0305 12/21/22 0435  WBC 18.2* 25.1*  --   HGB 7.8* 7.1* 6.8*  HCT 24.1* 22.0* 21.3*  PLT 82* 102*  --    BMET Recent Labs    12/19/22 0502 12/20/22 0347  NA 140 138  K 3.6 3.5  CL 107 107  CO2 23 23  GLUCOSE 302* 163*  BUN 39* 45*  CREATININE 1.71* 1.67*  CALCIUM 7.6* 7.0*   PT/INR No results for input(s): "LABPROT", "INR" in the last 72 hours. CMP     Component Value Date/Time   NA 138 12/20/2022 0347   NA 135 01/21/2020 1056   K 3.5 12/20/2022 0347   CL 107  12/20/2022 0347   CO2 23 12/20/2022 0347   GLUCOSE 163 (H) 12/20/2022 0347   BUN 45 (H) 12/20/2022 0347   BUN 12 01/21/2020 1056   CREATININE 1.67 (H) 12/20/2022 0347   CREATININE 0.91 09/21/2022 1141   CALCIUM 7.0 (L) 12/20/2022 0347   PROT 4.0 (L) 12/19/2022 0502   ALBUMIN <1.5 (L) 12/19/2022 0502   AST 42 (H) 12/19/2022 0502   AST 18 09/21/2022 1141   ALT 41 12/19/2022 0502   ALT 12 09/21/2022 1141   ALKPHOS 87 12/19/2022 0502   BILITOT 1.0 12/19/2022 0502   BILITOT 0.4 09/21/2022 1141   GFRNONAA 38 (L) 12/20/2022 0347   GFRNONAA >60 09/21/2022 1141   GFRAA 108 01/21/2020 1056   Lipase     Component Value Date/Time   LIPASE 762 (H) 12/21/2022 0305       Studies/Results: ECHOCARDIOGRAM LIMITED  Result Date: 12/20/2022    ECHOCARDIOGRAM LIMITED REPORT   Patient Name:   [name] Date of Exam: 12/20/2022 Medical Rec #:  [id number]        Height:       64.0 in Accession #:    [id number]       Weight:       140.4 lb Date of Birth:  09/01/1975        BSA:  1.683 m Patient Age:    47 years         BP:           170/111 mmHg Patient Gender: F                HR:           120 bpm. Exam Location:  Inpatient Procedure: Cardiac Doppler, Limited Echo and Limited Color Doppler Indications:    Pulmonary Embolus I26.09  History:        Patient has prior history of Echocardiogram examinations, most                 recent 12/15/2022. Breast Cancer, Arrythmias:Cardiac Arrest; Risk                 Factors:Former Smoker.  Sonographer:    Aron Baba Referring Phys: [id number] Cristopher Peru  Sonographer Comments: No apical window and suboptimal subcostal window. Image acquisition challenging due to breast implants. IMPRESSIONS  1. Left ventricular ejection fraction, by estimation, is 60 to 65%. The left ventricle has normal function.  2. Right ventricular systolic function is normal. The right ventricular size is normal. Tricuspid regurgitation signal is inadequate for assessing PA pressure.   3. Aortic valve regurgitation is not visualized.  4. The inferior vena cava is dilated in size with >50% respiratory variability, suggesting right atrial pressure of 8 mmHg. Conclusion(s)/Recommendation(s): No RV strain, no pulmonary hypertension. FINDINGS  Left Ventricle: Left ventricular ejection fraction, by estimation, is 60 to 65%. The left ventricle has normal function. There is no left ventricular hypertrophy. Right Ventricle: The right ventricular size is normal. Right ventricular systolic function is normal. Tricuspid regurgitation signal is inadequate for assessing PA pressure. Tricuspid Valve: Tricuspid valve regurgitation is trivial. Aortic Valve: Aortic valve regurgitation is not visualized. Pulmonic Valve: Pulmonic valve regurgitation is not visualized. Venous: The inferior vena cava is dilated in size with greater than 50% respiratory variability, suggesting right atrial pressure of 8 mmHg. Additional Comments: Spectral Doppler performed. Color Doppler performed.  LEFT VENTRICLE PLAX 2D LVIDd:         3.50 cm LVIDs:         2.10 cm LV PW:         0.90 cm LV IVS:        0.60 cm  LEFT ATRIUM         Index LA diam:    2.20 cm 1.31 cm/m Carolan Clines Electronically signed by Carolan Clines Signature Date/Time: 12/20/2022/12:08:08 PM    Final    VAS [place] LOWER EXTREMITY VENOUS (DVT)  Result Date: 12/20/2022  Lower Venous DVT Study Patient Name:  [name]  Date of Exam:   12/20/2022 Medical Rec #: [id number]         Accession #:    [id number] Date of Birth: Feb 22, 1976         Patient Gender: F Patient Age:   89 years Exam Location:  [facility] Procedure:      VAS [place] LOWER EXTREMITY VENOUS (DVT) Referring Phys: Mertha Baars --------------------------------------------------------------------------------  Indications: Pulmonary embolism.  Risk Factors: Cancer. Limitations: Poor ultrasound/tissue interface, bandages and patient positioning, patient immobility. Comparison Study: 12/14/2022 - Negative  for DVT. Performing Technologist: Chanda Busing RVT  Examination Guidelines: A complete evaluation includes B-mode imaging, spectral Doppler, color Doppler, and power Doppler as needed of all accessible portions of each vessel. Bilateral testing is considered an integral part of a complete examination. Limited examinations for reoccurring  indications may be performed as noted. The reflux portion of the exam is performed with the patient in reverse Trendelenburg.  +---------+---------------+---------+-----------+----------+--------------+ RIGHT    CompressibilityPhasicitySpontaneityPropertiesThrombus Aging +---------+---------------+---------+-----------+----------+--------------+ CFV      Full           Yes      Yes                                 +---------+---------------+---------+-----------+----------+--------------+ SFJ      Full                                                        +---------+---------------+---------+-----------+----------+--------------+ FV Prox  Full                                                        +---------+---------------+---------+-----------+----------+--------------+ FV Mid                  Yes      Yes                                 +---------+---------------+---------+-----------+----------+--------------+ FV Distal               Yes      Yes                                 +---------+---------------+---------+-----------+----------+--------------+ PFV      Full                                                        +---------+---------------+---------+-----------+----------+--------------+ POP      Full           Yes      Yes                                 +---------+---------------+---------+-----------+----------+--------------+ PTV      Full                                                        +---------+---------------+---------+-----------+----------+--------------+ PERO     Full                                                         +---------+---------------+---------+-----------+----------+--------------+   +---------+---------------+---------+-----------+----------+--------------+ LEFT     CompressibilityPhasicitySpontaneityPropertiesThrombus Aging +---------+---------------+---------+-----------+----------+--------------+ CFV      Full           Yes  No                                  +---------+---------------+---------+-----------+----------+--------------+ SFJ      Full                                                        +---------+---------------+---------+-----------+----------+--------------+ FV Prox  Full                                                        +---------+---------------+---------+-----------+----------+--------------+ FV Mid                  Yes      Yes                                 +---------+---------------+---------+-----------+----------+--------------+ FV Distal               Yes      Yes                                 +---------+---------------+---------+-----------+----------+--------------+ PFV      Full                                                        +---------+---------------+---------+-----------+----------+--------------+ POP      Full           Yes      Yes                                 +---------+---------------+---------+-----------+----------+--------------+ PTV      Full                                                        +---------+---------------+---------+-----------+----------+--------------+ PERO     Full                                                        +---------+---------------+---------+-----------+----------+--------------+    Summary: RIGHT: - There is no evidence of deep vein thrombosis in the lower extremity. However, portions of this examination were limited- see technologist comments above.  - No cystic structure found in the popliteal fossa.  LEFT:  - There is no evidence of deep vein thrombosis in the lower extremity. However, portions of this examination were limited- see technologist comments above.  - No cystic structure found in the popliteal fossa.  *See table(s) above for measurements  and observations.    Preliminary    CT Angio Chest Pulmonary Embolism (PE) W or WO Contrast  Result Date: 12/19/2022 CLINICAL DATA:  History of breast cancer and Crohn's disease. Chest pain. EXAM: CT ANGIOGRAPHY CHEST WITH CONTRAST TECHNIQUE: Multidetector CT imaging of the chest was performed using the standard protocol during bolus administration of intravenous contrast. Multiplanar CT image reconstructions and MIPs were obtained to evaluate the vascular anatomy. RADIATION DOSE REDUCTION: This exam was performed according to the departmental dose-optimization program which includes automated exposure control, adjustment of the mA and/or kV according to patient size and/or use of iterative reconstruction technique. CONTRAST:  75mL OMNIPAQUE IOHEXOL 350 MG/ML SOLN COMPARISON:  Most recent CT scan of the chest 11/18/2022 FINDINGS: Cardiovascular: Adequate opacification of the pulmonary arteries to the segmental level. Positive for segmental PE in the left lower lobe pulmonary artery distribution. Left IJ approach port catheter. The heart is normal in size. No pericardial effusion. Mediastinum/Nodes: No enlarged mediastinal, hilar, or axillary lymph nodes. Thyroid gland, trachea, and esophagus demonstrate no significant findings. Lungs/Pleura: Moderate right and small left layering pleural effusions with associated bibasilar atelectasis. No pulmonary edema. The degree of atelectasis is slightly greater than expected. Small volume aspiration or lower lobe pneumonia is difficult to exclude. Upper Abdomen: Gastric tube present with the tip in the region of the pylorus. Small volume ascites. Musculoskeletal: No chest wall abnormality. No acute or significant osseous findings.  Review of the MIP images confirms the above findings. IMPRESSION: 1. Positive for segmental PE in the left lower lobe pulmonary artery branches. 2. Moderate to large right and small to moderate left pleural effusions with associated bibasilar atelectasis. 3. Greater than expected consolidative changes in the bilateral lower lobes than expected for simple atelectasis. Superimposed small volume aspiration or pneumonia is suspected. 4. Partially imaged ascites. 5. Gastric tube present with the tip in the region of the pylorus. 6. Left IJ approach port catheter in place. These results will be called to the ordering clinician or representative by the Radiologist Assistant, and communication documented in the PACS or [employer]. Electronically Signed   By: Malachy Moan M.D.   On: 12/19/2022 16:47   DG Abd 1 View  Result Date: 12/19/2022 CLINICAL DATA:  Nasogastric tube placement. EXAM: ABDOMEN - 1 VIEW COMPARISON:  December 13, 2022. FINDINGS: Distal tip of nasogastric tube is seen in expected position of distal stomach. IMPRESSION: Distal tip of nasogastric tube is seen in expected position of distal stomach. Electronically Signed   By: Lupita Raider M.D.   On: 12/19/2022 13:30    Anti-infectives: Anti-infectives (From admission, onward)    Start     Dose/Rate Route Frequency Ordered Stop   12/13/22 1000  piperacillin-tazobactam (ZOSYN) IVPB 3.375 g  Status:  Discontinued        3.375 g 12.5 mL/hr over 240 Minutes Intravenous Every 8 hours 12/13/22 6578 12/20/22 1333        Assessment/Plan Hx Crohn's disease s/p subtotal colectomy  Internal hernia with bowel ischemia -S/p exploratory laparotomy with small bowel resection 9/5 Dr. Hillery Hunter -POD#5 s/p exploratory laparotomy, ileostomy, abdominal closure 9/7 Dr. Hillery Hunter - approximately 100cc small bowel left and will almost definitely be TPN dependent, highly likely to have high ostomy output  -Imodium 2mg  BID, Iron 325mg  BID, Fibercon  625mg  BID started 9/11 to slow ileostomy output down. Monitor volume of ostomy output as she will likely need intermittent fluid replacement for this.   -SLP to see today for swallow eval,  ok to advance as tolerated to regular diet. Continue TPN  -3 areas of oozing from midline wound cauterized with silver nitrate sticks today, pressure applied and hemostasis achieved. Wound packed with dry gauze. Apply ice pack. Plan to change dressing tomorrow unless it becomes saturated - WOC consult for new ostomy -pt is not asking for pain but clearly having pain after a major abdominal surgery. Schedule oxy 5mg  q6hr with her tylenol   ID - zosyn 9/4>>9/11 FEN - CLD, TPN VTE - scd, HEP GTT held 9/12 for bleeding/ anemia Foley - present   --per CCM-- Shock - off pressors VDRF Recent cardiac arrest 9/3 AKI - improving Anemia Thrombocytopenia Hx metastatic breast cancer    LOS: 8 days    Franne Forts, [facility] 12/21/2022, 8:39 AM Please see Amion for pager number during day hours 7:00am-4:30pm

## 2022-12-21 NOTE — Progress Notes (Signed)
eLink Physician-Brief Progress Note Patient Name: [name] DOB: Apr 29, 1975 MRN: [id number]   Date of Service  12/21/2022  HPI/Events of Note  Patient with blood soaked abdominal wound pads.  eICU Interventions  Type & Screen, serial H & H, bedside RN requested to inform surgeon stat.        Thomasene Lot Harold Moncus 12/21/2022, 4:14 AM

## 2022-12-21 NOTE — Progress Notes (Signed)
Received call from blood bank lab technician at approximately [phone] who stated that they have a notation in their system that this patient refused blood, therefore they will not issue it. Explained to technician that any previous refusals were not this admission, that she's received blood this admission, we have a valid consent form, and patient gave consent this morning to receive blood transfusion. Explained that their notation should not delay transfusion for critical hemoglobin with bleeding patient. Technician stated that she was going to still wait to consult with another person that would arrive to work after 6 am today. Informed Elink nurse, Sheria Lang and she stated that Dr Warrick Parisian spoke to the patient and sister directly about the need for transfusion and obtained additional verbal consent.  While writing this note, phlebotomy came to say that they haven't obtained a type and screen yet. But, I showed her the result from this morning at 0608. This is an additional delay.

## 2022-12-22 ENCOUNTER — Inpatient Hospital Stay (HOSPITAL_COMMUNITY): Payer: Managed Care, Other (non HMO)

## 2022-12-22 DIAGNOSIS — K5010 Crohn's disease of large intestine without complications: Secondary | ICD-10-CM | POA: Diagnosis not present

## 2022-12-22 DIAGNOSIS — R109 Unspecified abdominal pain: Secondary | ICD-10-CM | POA: Diagnosis not present

## 2022-12-22 DIAGNOSIS — K562 Volvulus: Secondary | ICD-10-CM | POA: Diagnosis not present

## 2022-12-22 LAB — BASIC METABOLIC PANEL
Anion gap: 7 (ref 5–15)
BUN: 53 mg/dL — ABNORMAL HIGH (ref 6–20)
CO2: 28 mmol/L (ref 22–32)
Calcium: 7.9 mg/dL — ABNORMAL LOW (ref 8.9–10.3)
Chloride: 106 mmol/L (ref 98–111)
Creatinine, Ser: 1.49 mg/dL — ABNORMAL HIGH (ref 0.44–1.00)
GFR, Estimated: 44 mL/min — ABNORMAL LOW (ref >60)
Glucose, Bld: 127 mg/dL — ABNORMAL HIGH (ref 70–99)
Potassium: 4.4 mmol/L (ref 3.5–5.1)
Sodium: 141 mmol/L (ref 135–145)

## 2022-12-22 LAB — PROTIME-INR
INR: 1.3 — ABNORMAL HIGH (ref 0.8–1.2)
Prothrombin Time: 16.3 s — ABNORMAL HIGH (ref 11.4–15.2)

## 2022-12-22 LAB — CBC
HCT: 23.9 % — ABNORMAL LOW (ref 36.0–46.0)
Hemoglobin: 7.9 g/dL — ABNORMAL LOW (ref 12.0–15.0)
MCH: 29.2 pg (ref 26.0–34.0)
MCHC: 33.1 g/dL (ref 30.0–36.0)
MCV: 88.2 fL (ref 80.0–100.0)
Platelets: 118 10*3/uL — ABNORMAL LOW (ref 150–400)
RBC: 2.71 MIL/uL — ABNORMAL LOW (ref 3.87–5.11)
RDW: 17.6 % — ABNORMAL HIGH (ref 11.5–15.5)
WBC: 33.9 10*3/uL — ABNORMAL HIGH (ref 4.0–10.5)
nRBC: 0 % (ref 0.0–0.2)

## 2022-12-22 LAB — GLUCOSE, CAPILLARY
Glucose-Capillary: 114 mg/dL — ABNORMAL HIGH (ref 70–99)
Glucose-Capillary: 129 mg/dL — ABNORMAL HIGH (ref 70–99)
Glucose-Capillary: 105 mg/dL — ABNORMAL HIGH (ref 70–99)
Glucose-Capillary: 101 mg/dL — ABNORMAL HIGH (ref 70–99)

## 2022-12-22 LAB — FIBRINOGEN: Fibrinogen: 741 mg/dL — ABNORMAL HIGH (ref 210–475)

## 2022-12-22 LAB — APTT: aPTT: 33 s (ref 24–36)

## 2022-12-22 LAB — PHOSPHORUS: Phosphorus: 3.1 mg/dL (ref 2.5–4.6)

## 2022-12-22 LAB — MAGNESIUM: Magnesium: 2 mg/dL (ref 1.7–2.4)

## 2022-12-22 MED ORDER — VANCOMYCIN HCL 1250 MG/250ML IV SOLN: 1250.0000 mg | INTRAVENOUS | Status: DC

## 2022-12-22 MED ORDER — PIPERACILLIN-TAZOBACTAM 3.375 G IVPB
3.3750 g | Freq: Three times a day (TID) | INTRAVENOUS | Status: DC
  Administered 2022-12-22 – 2022-12-29 (×22): 3.375 g via INTRAVENOUS
  Filled 2022-12-22 (×23): qty 50

## 2022-12-22 MED ORDER — TRAVASOL 10 % IV SOLN
INTRAVENOUS | Status: AC
  Filled 2022-12-22: qty 940.8

## 2022-12-22 MED ORDER — OXYCODONE HCL 5 MG PO TABS
5.0000 mg | ORAL_TABLET | Freq: Four times a day (QID) | ORAL | Status: DC
  Administered 2022-12-22: 5 mg via ORAL
  Filled 2022-12-22: qty 1

## 2022-12-22 MED ORDER — FERROUS SULFATE 300 (60 FE) MG/5ML PO SOLN
300.0000 mg | Freq: Two times a day (BID) | ORAL | Status: DC
  Administered 2022-12-22 – 2023-01-11 (×40): 300 mg
  Filled 2022-12-22 (×44): qty 5

## 2022-12-22 MED ORDER — HYDROMORPHONE HCL 1 MG/ML IJ SOLN
1.0000 mg | Freq: Once | INTRAMUSCULAR | Status: AC
  Administered 2022-12-22: 1 mg via INTRAVENOUS

## 2022-12-22 MED ORDER — LABETALOL HCL 5 MG/ML IV SOLN
20.0000 mg | INTRAVENOUS | Status: DC | PRN
  Filled 2022-12-22: qty 4

## 2022-12-22 MED ORDER — VANCOMYCIN HCL 1250 MG/250ML IV SOLN
1250.0000 mg | Freq: Once | INTRAVENOUS | Status: AC
  Administered 2022-12-22: 1250 mg via INTRAVENOUS
  Filled 2022-12-22: qty 250

## 2022-12-22 MED ORDER — OXYCODONE HCL 5 MG PO TABS: 5.0000 mg | ORAL_TABLET | Freq: Three times a day (TID) | ORAL | Status: DC

## 2022-12-22 MED ORDER — HYDROMORPHONE HCL 1 MG/ML IJ SOLN
INTRAMUSCULAR | Status: AC
  Filled 2022-12-22: qty 1

## 2022-12-22 MED ORDER — OXYCODONE HCL 5 MG PO TABS
5.0000 mg | ORAL_TABLET | ORAL | Status: DC | PRN
  Administered 2022-12-23 – 2022-12-25 (×5): 5 mg
  Filled 2022-12-22 (×5): qty 1

## 2022-12-22 MED ORDER — OXYCODONE HCL 5 MG PO TABS
5.0000 mg | ORAL_TABLET | Freq: Four times a day (QID) | ORAL | Status: DC
  Administered 2022-12-22 – 2022-12-27 (×19): 5 mg
  Filled 2022-12-22 (×19): qty 1

## 2022-12-22 NOTE — Progress Notes (Signed)
Pharmacy Antibiotic Note  [name] is a 47 y.o. female admitted on 12/12/2022 with pneumonia.  Pharmacy has been consulted for Vancomycin + Zosyn dosing.  Possible infiltrate on CXR.  Completed 7d course of Zosyn on 9/11. MRSA PCR negative on 9/10  Plan: Zosyn 3.375g IV q8h (4 hour infusion). Vancomycin 1250mg  IV q36h to target AUC 400-550. Estimated AUC on this regimen = 462 Daily Scr while on Vanc/Zosyn combination Monitor renal function and cx data    Height: 5\' 4"  (162.6 cm) Weight: 65.1 kg (143 lb 8.3 oz) IBW/kg (Calculated) : 54.7  Temp (24hrs), Avg:99.4 F (37.4 C), Min:99 F (37.2 C), Max:99.9 F (37.7 C)  Recent Labs  Lab 12/15/22 1046 12/15/22 1340 12/16/22 0345 12/17/22 0412 12/18/22 0705 12/18/22 1000 12/19/22 0502 12/20/22 0347 12/21/22 0305 12/21/22 1027 12/22/22 0445  WBC  --   --  29.3*   < >  --  20.0* 19.8* 18.2* 25.1*  --  33.9*  CREATININE 2.10* 2.06* 2.05*   < > 1.80*  --  1.71* 1.67*  --  1.40* 1.49*  LATICACIDVEN 4.2* 3.9* 2.1*  --   --   --   --   --   --   --   --    < > = values in this interval not displayed.    Estimated Creatinine Clearance: 41.2 mL/min (A) (by C-G formula based on SCr of 1.49 mg/dL (H)).    Allergies  Allergen Reactions   Nsaids Other (See Comments)    Crohns disease/ IBD    Antimicrobials this admission: 9/13 Zosyn >>  9/13 Vancomycin >>   Dose adjustments this admission:  Microbiology results: 9/4 BCx: negative 9/10 Sputum: negative  9/10 MRSA PCR: negative  Thank you for allowing pharmacy to be a part of this patient's care.  Junita Push PharmD 12/22/2022 5:59 AM

## 2022-12-22 NOTE — Progress Notes (Signed)
Pt consistently refusing care throughout shift, even with family encouragement, making assessment difficult. Exhibiting symptoms of paranoia regarding medications and treatments. Denies pain/anxiety but is showing physical s/s including tachycardia, htn, and guarding. Pt swatting at staff when attempting to administer medications or to flush NG tube. TRN has frequently encouraged pt to express herself and participate in care. Pt routinely refusing to answer questions and is difficult to understand when speaking d/t very soft voice and will refuse to repeat herself. HR and BP have been elevated throughout most of shift, though notably, she had significant improvement in both when sleeping. No evidence of bleeding from midline incision. Will continue to encourage pt to verbalize feelings and will administer PRNs as pt allows.

## 2022-12-22 NOTE — Plan of Care (Signed)
  Problem: Metabolic: Goal: Ability to maintain appropriate glucose levels will improve Outcome: Progressing   Problem: Skin Integrity: Goal: Risk for impaired skin integrity will decrease Outcome: Progressing   Problem: Tissue Perfusion: Goal: Adequacy of tissue perfusion will improve Outcome: Progressing   Problem: Clinical Measurements: Goal: Respiratory complications will improve Outcome: Progressing   Problem: Activity: Goal: Risk for activity intolerance will decrease Outcome: Progressing   Problem: Elimination: Goal: Will not experience complications related to bowel motility Outcome: Progressing Goal: Will not experience complications related to urinary retention Outcome: Progressing

## 2022-12-22 NOTE — Progress Notes (Signed)
PHARMACY - TOTAL PARENTERAL NUTRITION CONSULT NOTE   Indication: Short bowel syndrome  Patient Measurements: Height: 5\' 4"  (162.6 cm) Weight: 62 kg (136 lb 11 oz) IBW/kg (Calculated) : 54.7   Body mass index is 23.46 kg/m. Usual Weight: 46.3 kg from 8/28  Assessment: 47 y.o. female with PMH of recurrent breast cancer not currently on treatment, Crohn's disease s/p subtotal colectomy in 2021, recurrent obstructions, colonic stricture, recent hospitalization 8/10-8/15/2024 for obstruction who presented on 12/12/2022 with abdominal pain, nausea, vomiting diarrhea.    9/4 Admit; found down in ED bathroom with no pulse, CPR > intubated 9/5 s/p ex-lap, found to have internal hernia with small bowel ischemia, only 105 cm of small bowel remaining OR 9/7 for closure, ostomy creation 9/8 pharmacy consulted to manage TPN  Glucose / Insulin: no Hx DM - steroids PTA for Crohn's disease, received steroid burst on admission; currently weaned to SoluCortef 50 mg daily - CBGs at goal < 180 - 9 units SSI given yesterday - no hypoglycemia noted Electrolytes: all stable WNL Renal: AKI after arrest; SCr had been improving (baseline < 1.0) but now stalled around 1.5; BUN elevated & rising; UOP remains robust off Lasix  Hepatic (9/10): LFTs/Bili normalized after post-arrest insult - albumin low on admission and now remains < 1.5 - TG back to normal after stopping propofol - lipase bump noted on 9/10; no prior levels since arrest, but seems more likely d/t ischemic insult rather than TPN, given TGs essentially WNL I/O:  - Charting shows +21L since admission (likely d/t incomplete charting & insensible losses; weight is up by 5 kg since admit however; this more likely reflects actual net fluid gain) - Ileostomy OP somewhat improved, but remains > 1L/day (expecting high output given short gut & colon bypass); fiber, iron, loperamide added per tube to slow transit time - mIVF: none GI Imaging:  - CTA 9/10  shows new PE, but also abdominal ascites GI Surgeries / Procedures:  - OR 9/5 ex lap, internal hernia w/ small bowel ischemia, SBR & celiotomy; > 105 cm small bowel remaining; - OR 9/7 for closure, ostomy creation Central access: triple lumen CVC R subclavian 9/4, triple lumen CVC R femoral 9/4;  PAC 05/02/22 TPN start date: 12/17/22  Nutritional Goals: - extubated & off propofol as of 9/10 - Updated RD recs: new goal TPN formulation at 70 mL/hr provides 95 g protein and 1737 kcal per day  RD Assessment: Estimated Needs Total Energy Estimated Needs: 1600-1800 Total Protein Estimated Needs: 85-95 grams Total Fluid Estimated Needs: 1.6L/day  Current Nutrition:  NPO TF at 20 ml/hr, unsure how much would be absorbing  Plan: Continue TPN at 70 mL/hr at 1800 Electrolytes in TPN: adjust Cl:Ac Na - 50 mEq/L K - 60 mEq/L Ca - 5 mEq/L Mg - 10 mEq/L Phos - 15 mmol/L Cl:Ac ratio - max AC >> 1:1 Add standard MVI and trace elements to TPN Completed thiamine 100 mg x 5 days outside of TPN per RD recs Adjust SSI to moderate scale with q6 hr CBG checks Can likely wean off SSI soon if needs remain low or if CBGs drop < 100 MIVF per primary Monitor TPN labs on Mon/Thurs   Bernadene Person, PharmD, BCPS [phone] 12/22/2022, 9:10 AM

## 2022-12-22 NOTE — Progress Notes (Signed)
Pt w/ sudden onset chest pain and c/o headache. Chest pain worse w/ inspiration. 02 sats 98% RA, RR: 24. RLL auscultation w/ fine crackles. Md notified, CXR ordered. Pt refusing additional analgesics at this time. Verbalized that she would notify staff if pain increases.

## 2022-12-22 NOTE — Progress Notes (Signed)
Nutrition Follow-up  DOCUMENTATION CODES:   Non-severe (moderate) malnutrition in context of chronic illness  INTERVENTION:   -Continue TPN management per Pharmacy -Daily weights while on TPN   -Recommend holding TF as pt with malabsorption (given ~100 cm small bowel remaining) -Will monitor for diet advancement and add supplements at that time as appropriate  Suspect patient will need to be TPN dependent long term.   NUTRITION DIAGNOSIS:   Moderate Malnutrition related to chronic illness (Crohn's disease; metastatic breast cancer) as evidenced by moderate fat depletion, moderate muscle depletion, percent weight loss (24% in 4.5 months).  Ongoing.  GOAL:   Patient will meet greater than or equal to 90% of their needs  Meeting with TPN.  MONITOR:   Labs, Weight trends, TF tolerance, I & O's, Diet advancement (TPN)  REASON FOR ASSESSMENT:   Consult Enteral/tube feeding initiation and management  ASSESSMENT:   47 y.o. female with PMH of recurrent breast cancer not currently on treatment, Crohn's disease s/p subtotal colectomy in 2021, recurrent obstructions, colonic stricture, recent hospitalization 8/10-8/15/2024 for obstruction who presented on 12/12/2022 with abdominal pain, nausea, vomiting diarrhea.  9/4 Admit; found down in ED bathroom with no pulse, intubated 9/5 s/p ex-lap, found to have internal hernia with small bowel ischemia, only ~100cm of small bowel remaining 9/7 ex-lap, I&D, end ileostomy 9/8 TPN started at 59mL/hr 9/9 TPN increased to 70mL/hr 9/10 extubated, NGT removed but then replaced. 9/12: TF stopped  Per RN, plan is to have SLP re-evaluate for possible diet advancement. Otherwise, RN will place small bore NGT to replace current NGT for medication.    Patient continues to receive TPN at goal rate of 70 ml/hr, providing 1737 kcals and 94g protein. Providing 100% of needs.  Admission weight: 125 lbs Current weight: 136 lbs  Medications: Ferrous  sulfate, Imodium, Fibercon   Labs reviewed: CBGs: 114-140   Diet Order:   Diet Order             Diet NPO time specified Except for: Other (See Comments)  Diet effective now                   EDUCATION NEEDS:   Education needs have been addressed  Skin:  Skin Assessment: Skin Integrity Issues: Skin Integrity Issues:: Incisions Incisions: Abdomen  Last BM:  9/13 -ileostomy  Height:   Ht Readings from Last 1 Encounters:  12/13/22 5\' 4"  (1.626 m)    Weight:   Wt Readings from Last 1 Encounters:  12/22/22 62 kg    BMI:  Body mass index is 23.46 kg/m.  Estimated Nutritional Needs:   Kcal:  1600-1800  Protein:  85-95 grams  Fluid:  1.6L/day  Tilda Franco, MS, RD, LDN Inpatient Clinical Dietitian Contact information available via Amion

## 2022-12-22 NOTE — Progress Notes (Signed)
eLink Physician-Brief Progress Note Patient Name: [name] DOB: 05/31/1975 MRN: [id number]   Date of Service  12/22/2022  HPI/Events of Note  CXR reviewed.  eICU Interventions  Sputum cultures ordered, empiric Vancomycin + Zosyn ordered.        Thomasene Lot Madex Seals 12/22/2022, 5:41 AM

## 2022-12-22 NOTE — Progress Notes (Signed)
Daily Progress Note   Patient Name: [name]       Date: 12/22/2022 DOB: 01/22/1976  Age: 47 y.o. MRN#: [id number] Attending Physician: Karren Burly, MD Primary Care Physician: Ollen Bowl, MD Admit Date: 12/12/2022 Length of Stay: 9 days  Discussed care with primary PCCM provider. Palliative medicine team continues to follow along peripherally with patient's medical journey. Goals have remained for on full scope/full code at this time. Please reach out if acute PMT needs arise. Thank you.    Alvester Morin, DO Palliative Care Provider PMT # [phone]

## 2022-12-22 NOTE — Progress Notes (Signed)
VENOUS (DVT)  Result Date: 12/21/2022  Lower Venous DVT Study Patient Name:  [name]  Date of Exam:   12/20/2022 Medical Rec #: [id number]         Accession #:    [id number] Date of Birth: 1976-03-10         Patient Gender: F Patient Age:   47 years Exam Location:  [facility] Procedure:      VAS [place] LOWER EXTREMITY VENOUS (DVT) Referring Phys: Mertha Baars --------------------------------------------------------------------------------  Indications: Pulmonary embolism.  Risk Factors: Cancer. Limitations: Poor ultrasound/tissue interface, bandages and patient positioning, patient immobility. Comparison Study: 12/14/2022 - Negative for DVT. Performing Technologist: Chanda Busing RVT   Examination Guidelines: A complete evaluation includes B-mode imaging, spectral Doppler, color Doppler, and power Doppler as needed of all accessible portions of each vessel. Bilateral testing is considered an integral part of a complete examination. Limited examinations for reoccurring indications may be performed as noted. The reflux portion of the exam is performed with the patient in reverse Trendelenburg.  +---------+---------------+---------+-----------+----------+--------------+ RIGHT    CompressibilityPhasicitySpontaneityPropertiesThrombus Aging +---------+---------------+---------+-----------+----------+--------------+ CFV      Full           Yes      Yes                                 +---------+---------------+---------+-----------+----------+--------------+ SFJ      Full                                                        +---------+---------------+---------+-----------+----------+--------------+ FV Prox  Full                                                        +---------+---------------+---------+-----------+----------+--------------+ FV Mid                  Yes      Yes                                 +---------+---------------+---------+-----------+----------+--------------+ FV Distal               Yes      Yes                                 +---------+---------------+---------+-----------+----------+--------------+ PFV      Full                                                        +---------+---------------+---------+-----------+----------+--------------+ POP      Full           Yes      Yes                                 +---------+---------------+---------+-----------+----------+--------------+  Patient ID: [name], female   DOB: Feb 26, 1976, 47 y.o.   MRN: [id number] [facility] Surgery Progress Note  6 Days Post-Op  Subjective: CC-  Low-grade fever and ongoing tachycardia overnight; with rising white count she has been started back on antibiotics for suspected pneumonia.  Objective: Vital signs in last 24 hours: Temp:  [99 F (37.2 C)-99.9 F (37.7 C)] 99.3 F (37.4 C) (09/13 0500) Pulse Rate:  [110-159] 138 (09/13 0500) Resp:  [16-36] 23 (09/13 0500) BP: (123-170)/(70-123) 162/92 (09/13 0500) SpO2:  [95 %-100 %] 96 % (09/13 0500) Weight:  [62 kg] 62 kg (09/13 0500) Last BM Date : 12/21/22  Intake/Output from previous day: 09/12 0701 - 09/13 0700 In: 2501 [I.V.:1837; Blood:545; NG/GT:119] Out: 2725 [Urine:1925; Emesis/NG output:150; Stool:650] Intake/Output this shift: No intake/output data recorded.  PE: Gen: Alert, NAD but somewhat delirious Card:  tachy to the 140s when I was in the room Pulm: unlabored respirations Abd: soft, ND, appropriately tender.   Lab Results:  Recent Labs    12/21/22 0305 12/21/22 0435 12/21/22 2319 12/22/22 0445  WBC 25.1*  --   --  33.9*  HGB 7.1*   < > 7.5* 7.9*  HCT 22.0*   < > 22.4* 23.9*  PLT 102*  --   --  118*   < > = values in this interval not displayed.   BMET Recent Labs    12/21/22 1027 12/22/22 0445  NA 140 141  K 4.6 4.4  CL 109 106  CO2 26 28  GLUCOSE 149* 127*  BUN 51* 53*  CREATININE 1.40* 1.49*  CALCIUM 7.6* 7.9*   PT/INR No results for input(s): "LABPROT", "INR" in the last 72 hours. CMP     Component Value Date/Time   NA 141 12/22/2022 0445   NA 135 01/21/2020 1056   K 4.4 12/22/2022 0445   CL 106 12/22/2022 0445   CO2 28 12/22/2022 0445   GLUCOSE 127 (H) 12/22/2022 0445   BUN 53 (H) 12/22/2022 0445   BUN 12 01/21/2020 1056   CREATININE 1.49 (H) 12/22/2022 0445   CREATININE 0.91 09/21/2022 1141   CALCIUM 7.9 (L) 12/22/2022 0445   PROT 4.2 (L) 12/21/2022 1027   ALBUMIN  <1.5 (L) 12/21/2022 1027   AST 32 12/21/2022 1027   AST 18 09/21/2022 1141   ALT 28 12/21/2022 1027   ALT 12 09/21/2022 1141   ALKPHOS 106 12/21/2022 1027   BILITOT 0.7 12/21/2022 1027   BILITOT 0.4 09/21/2022 1141   GFRNONAA 44 (L) 12/22/2022 0445   GFRNONAA >60 09/21/2022 1141   GFRAA 108 01/21/2020 1056   Lipase     Component Value Date/Time   LIPASE 762 (H) 12/21/2022 0305       Studies/Results: DG Chest Port 1 View  Result Date: 12/22/2022 CLINICAL DATA:  Acute respiratory failure, chest pain, crackles at right lung base. EXAM: PORTABLE CHEST 1 VIEW COMPARISON:  12/19/2022. FINDINGS: The heart size and mediastinal contours are stable. A left chest port and right central venous catheter terminates over the superior vena cava. Hazy opacities are noted in the mid to lower lung fields bilaterally suggesting moderate layering pleural effusions. Airspace disease is noted at the lung bases. No pneumothorax. An enteric tube terminates in the stomach. IMPRESSION: Hazy opacities in the mid to lower lung fields bilaterally likely reflecting moderate layering pleural effusions with atelectasis or infiltrate. Electronically Signed   By: Thornell Sartorius M.D.   On: 12/22/2022 04:45   VAS [place] LOWER EXTREMITY  Patient ID: [name], female   DOB: Feb 26, 1976, 47 y.o.   MRN: [id number] [facility] Surgery Progress Note  6 Days Post-Op  Subjective: CC-  Low-grade fever and ongoing tachycardia overnight; with rising white count she has been started back on antibiotics for suspected pneumonia.  Objective: Vital signs in last 24 hours: Temp:  [99 F (37.2 C)-99.9 F (37.7 C)] 99.3 F (37.4 C) (09/13 0500) Pulse Rate:  [110-159] 138 (09/13 0500) Resp:  [16-36] 23 (09/13 0500) BP: (123-170)/(70-123) 162/92 (09/13 0500) SpO2:  [95 %-100 %] 96 % (09/13 0500) Weight:  [62 kg] 62 kg (09/13 0500) Last BM Date : 12/21/22  Intake/Output from previous day: 09/12 0701 - 09/13 0700 In: 2501 [I.V.:1837; Blood:545; NG/GT:119] Out: 2725 [Urine:1925; Emesis/NG output:150; Stool:650] Intake/Output this shift: No intake/output data recorded.  PE: Gen: Alert, NAD but somewhat delirious Card:  tachy to the 140s when I was in the room Pulm: unlabored respirations Abd: soft, ND, appropriately tender.   Lab Results:  Recent Labs    12/21/22 0305 12/21/22 0435 12/21/22 2319 12/22/22 0445  WBC 25.1*  --   --  33.9*  HGB 7.1*   < > 7.5* 7.9*  HCT 22.0*   < > 22.4* 23.9*  PLT 102*  --   --  118*   < > = values in this interval not displayed.   BMET Recent Labs    12/21/22 1027 12/22/22 0445  NA 140 141  K 4.6 4.4  CL 109 106  CO2 26 28  GLUCOSE 149* 127*  BUN 51* 53*  CREATININE 1.40* 1.49*  CALCIUM 7.6* 7.9*   PT/INR No results for input(s): "LABPROT", "INR" in the last 72 hours. CMP     Component Value Date/Time   NA 141 12/22/2022 0445   NA 135 01/21/2020 1056   K 4.4 12/22/2022 0445   CL 106 12/22/2022 0445   CO2 28 12/22/2022 0445   GLUCOSE 127 (H) 12/22/2022 0445   BUN 53 (H) 12/22/2022 0445   BUN 12 01/21/2020 1056   CREATININE 1.49 (H) 12/22/2022 0445   CREATININE 0.91 09/21/2022 1141   CALCIUM 7.9 (L) 12/22/2022 0445   PROT 4.2 (L) 12/21/2022 1027   ALBUMIN  <1.5 (L) 12/21/2022 1027   AST 32 12/21/2022 1027   AST 18 09/21/2022 1141   ALT 28 12/21/2022 1027   ALT 12 09/21/2022 1141   ALKPHOS 106 12/21/2022 1027   BILITOT 0.7 12/21/2022 1027   BILITOT 0.4 09/21/2022 1141   GFRNONAA 44 (L) 12/22/2022 0445   GFRNONAA >60 09/21/2022 1141   GFRAA 108 01/21/2020 1056   Lipase     Component Value Date/Time   LIPASE 762 (H) 12/21/2022 0305       Studies/Results: DG Chest Port 1 View  Result Date: 12/22/2022 CLINICAL DATA:  Acute respiratory failure, chest pain, crackles at right lung base. EXAM: PORTABLE CHEST 1 VIEW COMPARISON:  12/19/2022. FINDINGS: The heart size and mediastinal contours are stable. A left chest port and right central venous catheter terminates over the superior vena cava. Hazy opacities are noted in the mid to lower lung fields bilaterally suggesting moderate layering pleural effusions. Airspace disease is noted at the lung bases. No pneumothorax. An enteric tube terminates in the stomach. IMPRESSION: Hazy opacities in the mid to lower lung fields bilaterally likely reflecting moderate layering pleural effusions with atelectasis or infiltrate. Electronically Signed   By: Thornell Sartorius M.D.   On: 12/22/2022 04:45   VAS [place] LOWER EXTREMITY  VENOUS (DVT)  Result Date: 12/21/2022  Lower Venous DVT Study Patient Name:  [name]  Date of Exam:   12/20/2022 Medical Rec #: [id number]         Accession #:    [id number] Date of Birth: 1976-03-10         Patient Gender: F Patient Age:   47 years Exam Location:  [facility] Procedure:      VAS [place] LOWER EXTREMITY VENOUS (DVT) Referring Phys: Mertha Baars --------------------------------------------------------------------------------  Indications: Pulmonary embolism.  Risk Factors: Cancer. Limitations: Poor ultrasound/tissue interface, bandages and patient positioning, patient immobility. Comparison Study: 12/14/2022 - Negative for DVT. Performing Technologist: Chanda Busing RVT   Examination Guidelines: A complete evaluation includes B-mode imaging, spectral Doppler, color Doppler, and power Doppler as needed of all accessible portions of each vessel. Bilateral testing is considered an integral part of a complete examination. Limited examinations for reoccurring indications may be performed as noted. The reflux portion of the exam is performed with the patient in reverse Trendelenburg.  +---------+---------------+---------+-----------+----------+--------------+ RIGHT    CompressibilityPhasicitySpontaneityPropertiesThrombus Aging +---------+---------------+---------+-----------+----------+--------------+ CFV      Full           Yes      Yes                                 +---------+---------------+---------+-----------+----------+--------------+ SFJ      Full                                                        +---------+---------------+---------+-----------+----------+--------------+ FV Prox  Full                                                        +---------+---------------+---------+-----------+----------+--------------+ FV Mid                  Yes      Yes                                 +---------+---------------+---------+-----------+----------+--------------+ FV Distal               Yes      Yes                                 +---------+---------------+---------+-----------+----------+--------------+ PFV      Full                                                        +---------+---------------+---------+-----------+----------+--------------+ POP      Full           Yes      Yes                                 +---------+---------------+---------+-----------+----------+--------------+  VENOUS (DVT)  Result Date: 12/21/2022  Lower Venous DVT Study Patient Name:  [name]  Date of Exam:   12/20/2022 Medical Rec #: [id number]         Accession #:    [id number] Date of Birth: 1976-03-10         Patient Gender: F Patient Age:   47 years Exam Location:  [facility] Procedure:      VAS [place] LOWER EXTREMITY VENOUS (DVT) Referring Phys: Mertha Baars --------------------------------------------------------------------------------  Indications: Pulmonary embolism.  Risk Factors: Cancer. Limitations: Poor ultrasound/tissue interface, bandages and patient positioning, patient immobility. Comparison Study: 12/14/2022 - Negative for DVT. Performing Technologist: Chanda Busing RVT   Examination Guidelines: A complete evaluation includes B-mode imaging, spectral Doppler, color Doppler, and power Doppler as needed of all accessible portions of each vessel. Bilateral testing is considered an integral part of a complete examination. Limited examinations for reoccurring indications may be performed as noted. The reflux portion of the exam is performed with the patient in reverse Trendelenburg.  +---------+---------------+---------+-----------+----------+--------------+ RIGHT    CompressibilityPhasicitySpontaneityPropertiesThrombus Aging +---------+---------------+---------+-----------+----------+--------------+ CFV      Full           Yes      Yes                                 +---------+---------------+---------+-----------+----------+--------------+ SFJ      Full                                                        +---------+---------------+---------+-----------+----------+--------------+ FV Prox  Full                                                        +---------+---------------+---------+-----------+----------+--------------+ FV Mid                  Yes      Yes                                 +---------+---------------+---------+-----------+----------+--------------+ FV Distal               Yes      Yes                                 +---------+---------------+---------+-----------+----------+--------------+ PFV      Full                                                        +---------+---------------+---------+-----------+----------+--------------+ POP      Full           Yes      Yes                                 +---------+---------------+---------+-----------+----------+--------------+

## 2022-12-22 NOTE — Progress Notes (Addendum)
PT Cancellation Note  Patient Details Name: [name] MRN: [id number] DOB: 1975-12-16   Cancelled Treatment:    Reason Eval/Treat Not Completed: Medical issues which prohibited therapy. HR high, has been bleeding from wound earlier today.  Blanchard Kelch PT Acute Rehabilitation Services Office [phone] Weekend [phone]   [name] 12/22/2022, 2:47 PM

## 2022-12-22 NOTE — Progress Notes (Signed)
SLP Cancellation Note  Patient Details Name: [name] MRN: [id number] DOB: [birth year]   Cancelled treatment:       Reason Eval/Treat Not Completed: Medical issues which prohibited therapy;actively bleeding from stomach per nursing report; may require surgical intervention.  ST will continue efforts as able.   Pat Jevon Shells,M.S.,CCC-SLP 12/22/2022, 12:23 PM

## 2022-12-22 NOTE — Progress Notes (Signed)
[name], MRN:  [id number], DOB:  [birth year], LOS: 9 ADMISSION DATE:  12/12/2022, CONSULTATION DATE:  12/13/2022 REFERRING MD:  Rubin Payor - EDP CHIEF COMPLAINT:  Cardiac arrest    History of Present Illness:  47 year old female with past medical history of recurrent breast cancer not currently on treatment, Crohn's disease s/p subtotal colectomy in 2021 recurrent obstructions, colonic stricture, IDA, recent hospitalization 8/10-8/15/2024 for obstruction seen by GI and general surgery. Underwent EGD and flexible sigmoidoscopy and discharged on steroids who presented to the emergency department on 12/12/2022 with abdominal pain, nausea, vomiting diarrhea. In the ED tachycardic, hypertensive. ED provider notes abdominal distention. She was given 2L IVF, morphine, IV PPI. Labs with WBC 10.8, Hgb 10.7, K 3.2. CT AP was ordered and showed markedly dilated distal small bowel consistent with chronic obstruction. No changes since prior study. Patient was admitted to [facility]. They consulted GI for input. Unfortunately, around 0820 this morning patient was found on the floor in the bathroom in ED pulseless. They estimate downtime around 5 minutes. ACLS was initiated. Concern for possible torsades de pointes and given IV mag, defibrillated. Coded for around 5 minutes. She was intubated. She had copious vomitus in the ETT. ROSC achieved and ICU was consulted. Notably, she had I-stat peri-code which showed hemoglobin of 5.5 down from 10 overnight.   Pertinent Medical History:  Crohn's disease with previous bowel obstruction s/p subtotal colectomy 2021, colonic stricture, IDA, breast cancer s/p mastectomy 2022  Significant Hospital Events: Including procedures, antibiotic start and stop dates in addition to other pertinent events   9/3: Admitted to [facility] for abdominal distention, presumed Crohn's exacerbation 9/4: ~0820 Cardiac arrest in ED. Admitted to ICU intubated, sedated. Hgb 5.5. Placed right femoral CVC.  Received 3 UPRBC, 3 FFP. 9/5: - Hgb stable at 11.7 after 3u PRBC, 3 FPP 9/5 overnight increasing pressor requirements - added vaso. Bladder pressures 28, went for exploratory lap w/ sb resxn, and celiotomy, intra op findings: internal hernia w/ associated small bowel ischemia. Also seen by GI and palliative. Full scope of care desired  9/6 Some ST changes in lateral leads. Weaning pressors. Removing femoral line and placing internal jugular. Renal fxn worse, on-going metabolic acidosis.  9/7: Back to OR for ostomy placement.  9/8: Off levo 9/9: Weaning sedation. Following commands. Tried PS of 5/5 which Vt did not hold up. Giving IV lasix for anasarca, she's 20+ L up on admission. Will try SBT again this afternoon.  9/10: Extubated, +PE on heparin gtt 9/11: Saturation of abdominal dressing and Hgb drift to 6.8 requiring blood transfusion (1U PRBCs). Heparin gtt paused. 9/12: Mental status improved, more interactive in care. Hgb stable/slight drift. Midline abdominal wound with continued bleeding today, vessel in deeper adipose tissue, seen by CCS.  Interim History / Subjective:  No significant events overnight Slept better, actually got some rest More interactive, brighter today Still having midline abdominal wound pain Hgb stable with very slight drift (7.9 from 8.5 post-transfusion) Midline wound with vessel oozing, resolved with pressure/Surgicel, silver nitrate if needed Intermittently confused/paranoid with pain medications  Objective:  Blood pressure (!) 149/88, pulse (!) 120, temperature 99.9 F (37.7 C), resp. rate 19, height 5\' 4"  (1.626 m), weight 62 kg, last menstrual period 04/24/2021, SpO2 96%.        Intake/Output Summary (Last 24 hours) at 12/22/2022 0823 Last data filed at 12/22/2022 0600 Gross per 24 hour  Intake 1789.74 ml  Output 3000 ml  Net -1210.26 ml   Ceasar Mons  Weights   12/19/22 0500 12/21/22 0500 12/22/22 0500  Weight: 63.7 kg 65.1 kg 62 kg   Physical  Examination: General: Acute-on-chronically ill-appearing middle aged woman in NAD. More conversant today. HEENT: [place]/AT, anicteric sclera, PERRL, moist mucous membranes. NGT in place. Neuro: Awake, oriented x 3-4. Responds to verbal stimuli. Following commands consistently. Moves all 4 extremities spontaneously. Generalized weakness/deconditioning. CV: Tachycardic to 120s (improved from prior), regular rhythm, no m/g/r. PULM: Breathing even and unlabored on 2LNC. Lung fields CTAB. GI: Soft, mildly distended, moderately TTP around midline incision. Midline incision with bleeding vessel (appears venous), improved with pressure/Surgicel. Ileostomy in place with brown-red output. Hypoactive bowel sounds. Extremities: Bilateral symmetric 1+ pitting LE edema noted. Trace symmetric edema of bilateral hands. Skin: Warm/dry, no rashes. Abdominal wounds as described above.  Resolved Hospital Problem List:  Cardiac arrest  Shock Shock liver Bradycardia  Acute hypoxic respiratory failure   Assessment & Plan:  Sepsis, likely secondary to bacterial translocation from ischemic gut Leukocytosis - Goal MAP > 65 - Fluid resuscitation as tolerated - Remains off of pressors since 9/8 - Trend WBC, fever curve - F/u Cx data - Continue broad-spectrum antibiotics (Zosyn), vanc started overnight but discontinued 9/13  Small bowel obstruction secondary to internal hernia and resultant ischemic gut status post exploratory laparotomy 9/5 S/p ex-lap and ileostomy creation 9/7 - CCS following, appreciate recommendations - NGT vs. DHT for trickle feeds, pending SLP eval - TPN per pharmacy, appreciate assistance - Multimodal pain management (APAP SCH, gaba SCH, oxycodone SCH, PRN medications for wound dressing changes) - Ostomy care per protocol  Pulmonary Embolism; LLL segmental PE  Echo 9/11 with EF 60-65%, normal RV function/size, no e/o R heart strain. BLE Dopplers negative for DVT. - Heparin gtt paused in  the setting of active oozing - Repeat coags (PT/INR, aPTT, fibrinogen) prior to heparin resumption - If normal, can resume as tolerated; favor resumption 9/14AM  Elevated Lipase; unlikely propofol related. She did have low grade temp yesterday. 208 -> 650 -> 762 - Trend, low threshold for repeat abdominal imaging - Defer additional workup to CCS - TG stable  Tachycardia ST elevation in the lateral leads Unclear etiology. She does have acute PE but it is subsegmental. Felt likely not volume related. No evidence of bleed. Not febrile. No thyroid history. Does not take rate control medication at home. Doubt withdrawal from Versed gtt. Echo 9/11 WNL. - Cardiac monitoring - Optimize electrolytes for K > 4, Mg > 2 - Metoprolol BID  Hypertension; no history of hypertension Not on antihypertensives at home. Could be pain, anxiety, acute illness related. - Metoprolol BID - Telemetry/cardiac monitoring  AKI, likely ATN in the setting of sepsis and end-organ hypoperfusion, improving - Trend BMP - Replete electrolytes as indicated - Monitor I&Os - Avoid nephrotoxic agents as able - Ensure adequate renal perfusion  Acute-on-chronic anemia Thrombocytopenia Likely multifactorial in the setting of postoperative status, heparin gtt for PE. 1U PRBCs given 9/12AM. - Trend H&H, Plt - Monitor for signs of active bleeding (midline abdominal wound with continued oozing 9/13) - Transfuse for Hgb < 7.0 or hemodynamically significant bleeding - F/u coags sent 9/13 - Heparin gtt paused, resume as appropriate  Hyperglycemia - SSI - CBGs Q4H - Goal CBG 140-180  Recurrent breast cancer Not currently on treatment; per Dr. Pamelia Hoit note 10/11/22, patient has declined further treatment at this time.  - Outpatient f/u once discharged for anti-estrogen therapy  Suspected depression, anxiousness Patient is extremely withdrawn today 9/12 and unwilling to  participate in care, refusing multiple instances of  care. I am concerned about her mental status and the weight of this hospitalization/multiple surgeries on the background of recurrent breast CA. - Anxiolytics PRN - Consider Psych consult for support - PMT following peripherally - Spiritual support via chaplain  Physical deconditioning - PT/OT/SLP as tolerated  Best Practice (right click and "Reselect all SmartList Selections" daily)   Diet/type: tubefeeds and TPN DVT prophylaxis: systemic heparin (paused at present 9/13)  GI prophylaxis: PPI Lines: Central line and yes and it is still needed Foley:  Yes, and it is still needed Code Status:  full code Last date of multidisciplinary goals of care discussion: Family (daughter and patient) updated at bedside 9/13.  Critical care time:   The patient is critically ill with multiple organ system failure and requires high complexity decision making for assessment and support, frequent evaluation and titration of therapies, advanced monitoring, review of radiographic studies and interpretation of complex data.   Critical Care Time devoted to patient care services, exclusive of separately billable procedures, described in this note is 38 minutes.  Tim Lair, PA-C [facility] 12/22/22 8:23 AM  Please see [email or web address] for pager details. From 7A-7P if no response, please call [phone] After hours, please call ELink [phone]

## 2022-12-23 ENCOUNTER — Inpatient Hospital Stay (HOSPITAL_COMMUNITY): Payer: Managed Care, Other (non HMO)

## 2022-12-23 ENCOUNTER — Encounter (HOSPITAL_COMMUNITY): Payer: Self-pay | Admitting: Internal Medicine

## 2022-12-23 DIAGNOSIS — K90829 Short bowel syndrome, unspecified: Secondary | ICD-10-CM | POA: Diagnosis not present

## 2022-12-23 LAB — GLUCOSE, CAPILLARY: Glucose-Capillary: 119 mg/dL — ABNORMAL HIGH (ref 70–99)

## 2022-12-23 LAB — CBC
HCT: 17.6 % — ABNORMAL LOW (ref 36.0–46.0)
HCT: 27.5 % — ABNORMAL LOW (ref 36.0–46.0)
Hemoglobin: 5.6 g/dL — CL (ref 12.0–15.0)
Hemoglobin: 9.3 g/dL — ABNORMAL LOW (ref 12.0–15.0)
MCH: 28.4 pg (ref 26.0–34.0)
MCH: 29.7 pg (ref 26.0–34.0)
MCHC: 31.8 g/dL (ref 30.0–36.0)
MCHC: 33.8 g/dL (ref 30.0–36.0)
MCV: 89.3 fL (ref 80.0–100.0)
MCV: 87.9 fL (ref 80.0–100.0)
Platelets: 137 10*3/uL — ABNORMAL LOW (ref 150–400)
Platelets: 152 10*3/uL (ref 150–400)
RBC: 1.97 MIL/uL — ABNORMAL LOW (ref 3.87–5.11)
RBC: 3.13 MIL/uL — ABNORMAL LOW (ref 3.87–5.11)
RDW: 18.5 % — ABNORMAL HIGH (ref 11.5–15.5)
RDW: 16.5 % — ABNORMAL HIGH (ref 11.5–15.5)
WBC: 29.7 10*3/uL — ABNORMAL HIGH (ref 4.0–10.5)
WBC: 28.3 10*3/uL — ABNORMAL HIGH (ref 4.0–10.5)
nRBC: 0 % (ref 0.0–0.2)
nRBC: 0 % (ref 0.0–0.2)

## 2022-12-23 LAB — COMPREHENSIVE METABOLIC PANEL
ALT: 22 U/L (ref 0–44)
AST: 24 U/L (ref 15–41)
Albumin: <1.5 g/dL — ABNORMAL LOW (ref 3.5–5.0)
Alkaline Phosphatase: 99 U/L (ref 38–126)
Anion gap: 7 (ref 5–15)
BUN: 57 mg/dL — ABNORMAL HIGH (ref 6–20)
CO2: 26 mmol/L (ref 22–32)
Calcium: 7.9 mg/dL — ABNORMAL LOW (ref 8.9–10.3)
Chloride: 107 mmol/L (ref 98–111)
Creatinine, Ser: 1.61 mg/dL — ABNORMAL HIGH (ref 0.44–1.00)
GFR, Estimated: 40 mL/min — ABNORMAL LOW (ref >60)
Glucose, Bld: 141 mg/dL — ABNORMAL HIGH (ref 70–99)
Potassium: 4.5 mmol/L (ref 3.5–5.1)
Sodium: 140 mmol/L (ref 135–145)
Total Bilirubin: 0.7 mg/dL (ref 0.3–1.2)
Total Protein: 4.3 g/dL — ABNORMAL LOW (ref 6.5–8.1)

## 2022-12-23 LAB — HEMOGLOBIN AND HEMATOCRIT, BLOOD
HCT: 31.7 % — ABNORMAL LOW (ref 36.0–46.0)
Hemoglobin: 10.6 g/dL — ABNORMAL LOW (ref 12.0–15.0)

## 2022-12-23 LAB — PROTIME-INR
INR: 1.3 — ABNORMAL HIGH (ref 0.8–1.2)
Prothrombin Time: 16.8 s — ABNORMAL HIGH (ref 11.4–15.2)

## 2022-12-23 LAB — PREPARE RBC (CROSSMATCH)

## 2022-12-23 MED ORDER — LACTATED RINGERS IV BOLUS
500.0000 mL | Freq: Once | INTRAVENOUS | Status: AC
  Administered 2022-12-23: 500 mL via INTRAVENOUS

## 2022-12-23 MED ORDER — FENTANYL CITRATE PF 50 MCG/ML IJ SOSY
12.5000 ug | PREFILLED_SYRINGE | INTRAMUSCULAR | Status: DC | PRN
  Administered 2022-12-23 – 2023-01-10 (×10): 25 ug via INTRAVENOUS
  Filled 2022-12-23 (×11): qty 1

## 2022-12-23 MED ORDER — LOPERAMIDE HCL 2 MG PO CAPS
4.0000 mg | ORAL_CAPSULE | Freq: Two times a day (BID) | ORAL | Status: DC
  Administered 2022-12-23 – 2022-12-26 (×7): 4 mg via ORAL
  Filled 2022-12-23 (×7): qty 2

## 2022-12-23 MED ORDER — SODIUM CHLORIDE 0.9% IV SOLUTION: Freq: Once | INTRAVENOUS | Status: AC

## 2022-12-23 MED ORDER — SODIUM CHLORIDE 0.9% FLUSH
10.0000 mL | Freq: Two times a day (BID) | INTRAVENOUS | Status: DC
  Administered 2022-12-23 – 2022-12-25 (×4): 10 mL
  Administered 2022-12-25: 40 mL
  Administered 2022-12-26: 30 mL
  Administered 2022-12-26: 10 mL
  Administered 2022-12-27: 30 mL
  Administered 2022-12-27: 10 mL
  Administered 2022-12-28: 30 mL
  Administered 2022-12-28 – 2022-12-30 (×5): 10 mL
  Administered 2022-12-31: 30 mL
  Administered 2022-12-31 – 2023-01-01 (×2): 10 mL
  Administered 2023-01-01: 30 mL
  Administered 2023-01-02 (×2): 10 mL
  Administered 2023-01-03: 40 mL
  Administered 2023-01-03 – 2023-01-04 (×2): 10 mL
  Administered 2023-01-04: 30 mL
  Administered 2023-01-05 – 2023-01-07 (×4): 10 mL
  Administered 2023-01-07: 20 mL
  Administered 2023-01-08 – 2023-01-09 (×3): 10 mL
  Administered 2023-01-09: 30 mL
  Administered 2023-01-10 (×2): 10 mL
  Administered 2023-01-11: 20 mL
  Administered 2023-01-11 – 2023-01-16 (×8): 10 mL

## 2022-12-23 MED ORDER — SODIUM CHLORIDE 0.9% FLUSH
10.0000 mL | INTRAVENOUS | Status: DC | PRN
  Administered 2023-01-11: 20 mL
  Administered 2023-01-12 – 2023-01-15 (×2): 10 mL

## 2022-12-23 MED ORDER — GABAPENTIN 250 MG/5ML PO SOLN
200.0000 mg | Freq: Three times a day (TID) | ORAL | Status: DC
  Administered 2022-12-23 – 2022-12-27 (×13): 200 mg via ORAL
  Filled 2022-12-23 (×14): qty 4

## 2022-12-23 MED ORDER — ALBUMIN HUMAN 25 % IV SOLN
25.0000 g | Freq: Once | INTRAVENOUS | Status: AC
  Administered 2022-12-23: 25 g via INTRAVENOUS
  Filled 2022-12-23: qty 100

## 2022-12-23 MED ORDER — TRAVASOL 10 % IV SOLN
INTRAVENOUS | Status: AC
  Filled 2022-12-23: qty 940.8

## 2022-12-23 MED ORDER — ACETAMINOPHEN 160 MG/5ML PO SOLN
650.0000 mg | Freq: Four times a day (QID) | ORAL | Status: DC | PRN
  Administered 2022-12-23 – 2022-12-24 (×2): 650 mg
  Filled 2022-12-23 (×2): qty 20.3

## 2022-12-23 NOTE — Progress Notes (Signed)
1945 patient found to have bled through midline dressing. I assisted Rodney Langton, RN in dressing change. VS stable. Pt given of fentanyl for pain. Patient tolerated well.

## 2022-12-23 NOTE — Progress Notes (Addendum)
PHARMACY - TOTAL PARENTERAL NUTRITION CONSULT NOTE   Indication: Short bowel syndrome  Patient Measurements: Height: 5\' 4"  (162.6 cm) Weight: 62 kg (136 lb 11 oz) IBW/kg (Calculated) : 54.7   Body mass index is 23.46 kg/m. Usual Weight: 46.3 kg from 8/28  Assessment: 47 y.o. female with PMH of recurrent breast cancer not currently on treatment, Crohn's disease s/p subtotal colectomy in 2021, recurrent obstructions, colonic stricture, recent hospitalization 8/10-8/15/2024 for obstruction who presented on 12/12/2022 with abdominal pain, nausea, vomiting diarrhea.    9/4 Admit; found down in ED bathroom with no pulse, CPR > intubated 9/5 s/p ex-lap, found to have internal hernia with small bowel ischemia, only 105 cm of small bowel remaining OR 9/7 for closure, ostomy creation 9/8 pharmacy consulted to manage TPN  Glucose / Insulin: no Hx DM - steroids PTA for Crohn's disease; has now been weaned off as of 9/13 - CBGs at goal 100-150; no lows - NO further SSI needed since steroids discontinued Electrolytes: all stable WNL Renal: AKI after arrest; SCr had been improving (baseline < 1.0) but now trending back up again along with BUN; UOP remains robust without diuretics Hepatic (9/10): LFTs/Bili normalized after post-arrest insult - albumin low on admission and now remains < 1.5 - TG normalized (9/11) after stopping propofol - lipase bump noted on 9/10; no prior levels since arrest, but seems less likely related to TPN given TG consistently WNL I/O: Charting shows +21L since admission (likely d/t incomplete charting & insensible losses); max weight was 9 kg above admission weight; now only +4 kg - likely reflects more accurate fluid status - Ileostomy remains > 1L/day (expecting high output given short gut & colon bypass); fiber, iron, loperamide added per tube to slow transit time - mIVF: none GI Imaging:  - CTA 9/10 shows new PE, but also abdominal ascites GI Surgeries / Procedures:  -  OR 9/5 ex lap, internal hernia w/ small bowel ischemia, SBR & celiotomy; > 105 cm small bowel remaining; - OR 9/7 for closure, ostomy creation Central access: triple lumen CVC R subclavian 9/4, triple lumen CVC R femoral 9/4;  PAC 05/02/22 TPN start date: 12/17/22  Nutritional Goals: - Updated RD recs: new goal TPN formulation at 70 mL/hr provides 95 g protein and 1737 kcal per day  RD Assessment: Estimated Needs Total Energy Estimated Needs: 1600-1800 Total Protein Estimated Needs: 85-95 grams Total Fluid Estimated Needs: 1.6L/day  Current Nutrition:  NPO TF stopped d/t high ileostomy OP; may advance diet soon, but would not anticipate significant enteral absorption given short bowel.  Plan: Continue TPN at 70 mL/hr at 1800 Given prolonged/indefinite need for TPN anticipated, will transition to cyclic TPN over the next 4-5 days Electrolytes in TPN: no changes Na - 50 mEq/L K - 60 mEq/L Ca - 5 mEq/L Mg - 10 mEq/L Phos - 15 mmol/L Cl:Ac ratio - 1:1 Add standard MVI and trace elements to TPN Chromium on hold d/t critical shortage Discontinue SSI/CBGs with excellent control - will resume as appropriate once cycling begins MIVF per primary Monitor TPN labs on Mon/Thurs BMP, Phos tomorrow   Bernadene Person, PharmD, BCPS [phone] 12/23/2022, 7:26 AM

## 2022-12-23 NOTE — Progress Notes (Signed)
PHARMACY NOTE -  Zosyn  Pharmacy has been assisting with dosing of Zosyn for HAP/VAP. Dosage remains stable at 3.375 g IV q8 hr and further renal adjustments per institutional Pharmacy antibiotic protocol  Pharmacy will sign off, following peripherally for culture results, dose adjustments, and length of therapy. Please reconsult if a change in clinical status warrants re-evaluation of dosage.  Bernadene Person, PharmD, BCPS [phone] 12/23/2022, 11:25 AM

## 2022-12-23 NOTE — Evaluation (Signed)
Clinical/Bedside Swallow Evaluation Patient Details  Name: [name] MRN: [id number] Date of Birth: 11-Feb-1976  Today's Date: 12/23/2022 Time: SLP Start Time (ACUTE ONLY): 1135 SLP Stop Time (ACUTE ONLY): 1150 SLP Time Calculation (min) (ACUTE ONLY): 15 min  Past Medical History:  Past Medical History:  Diagnosis Date   Breast cancer (HCC)    right   Colon stricture (HCC) 05/07/2019   Colonic stricture (HCC) 11/19/2022   Crohn's colitis, with intestinal obstruction (HCC) 09/17/2013   Dx 2006 - right and left colon involved 2011 - pan-colitis 09/17/2013 left colitis 60-20 cm March 2021 subtotal colectomy ileosigmoid anastomosis for colonic strictures-Dr. Maisie Fus     Crohn's disease [facility])    Exacerbation of Crohn's disease of large intestine (HCC) 12/13/2022   Family history of breast cancer 06/11/2020   History of abdominal subtotal colectomy 11/19/2022   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 10/17/2010   Rectal bleeding    Uterine fibroid    Vitamin D deficiency 09/24/2013   Past Surgical History:  Past Surgical History:  Procedure Laterality Date   AXILLARY LYMPH NODE DISSECTION Right 11/09/2020   Procedure: RIGHT AXILLARY LYMPH NODE DISSECTION;  Surgeon: Emelia Loron, MD;  Location: [facility];  Service: General;  Laterality: Right;   BIOPSY  07/01/2020   Procedure: BIOPSY;  Surgeon: Benancio Deeds, MD;  Location: [facility];  Service: Gastroenterology;;   BIOPSY  01/27/2021   Procedure: BIOPSY;  Surgeon: Benancio Deeds, MD;  Location: [facility];  Service: Gastroenterology;;   BIOPSY  09/07/2021   Procedure: BIOPSY;  Surgeon: Lynann Bologna, MD;  Location: [facility];  Service: Gastroenterology;;   BIOPSY  11/21/2022   Procedure: BIOPSY;  Surgeon: Lemar Lofty., MD;  [facility];  Service: Gastroenterology;;   BREAST BIOPSY Right 03/24/2022   [place] RT BREAST BX W LOC DEV 1ST LESION  IMG BX SPEC US GUIDE 03/24/2022 [facility]   BREAST BIOPSY  04/28/2022   [place] RT RADIOACTIVE SEED LOC 04/28/2022 [facility]   BREAST IMPLANT REMOVAL Right 08/07/2022   Procedure: REMOVAL  OF RIGHT BREAST IMPLANTS;  Surgeon: Allena Napoleon, MD;  Location: [facility];  Service: Plastics;  Laterality: Right;   BREAST RECONSTRUCTION WITH PLACEMENT OF TISSUE EXPANDER AND FLEX HD (ACELLULAR HYDRATED DERMIS) Bilateral 11/09/2020   Procedure: BILATERAL BREAST RECONSTRUCTION WITH PLACEMENT OF TISSUE EXPANDER AND FLEX HD (ACELLULAR HYDRATED DERMIS);  Surgeon: Allena Napoleon, MD;  Location: [facility];  Service: Plastics;  Laterality: Bilateral;   CAPSULOTOMY Bilateral 07/25/2021   Procedure: CAPSULOTOMY;  Surgeon: Allena Napoleon, MD;  Location:  [facility];  Service: Plastics;  Laterality: Bilateral;   COLECTOMY  06/27/2019   COLONOSCOPY  2015   ESOPHAGOGASTRODUODENOSCOPY (EGD) WITH PROPOFOL N/A 11/21/2022   Procedure: ESOPHAGOGASTRODUODENOSCOPY (EGD) WITH PROPOFOL;  Surgeon: Lemar Lofty., MD;  [facility];  Service: Gastroenterology;  Laterality: N/A;   FLEXIBLE SIGMOIDOSCOPY N/A 07/01/2020   Procedure: FLEXIBLE SIGMOIDOSCOPY;  Surgeon: Benancio Deeds, MD;  [facility];  Service: Gastroenterology;  Laterality: N/A;   FLEXIBLE SIGMOIDOSCOPY N/A 01/27/2021   Procedure: FLEXIBLE SIGMOIDOSCOPY;  Surgeon: Benancio Deeds, MD;  [facility];  Service: Gastroenterology;  Laterality: N/A;   FLEXIBLE SIGMOIDOSCOPY N/A 09/07/2021   Procedure: FLEXIBLE SIGMOIDOSCOPY;  Surgeon: Lynann Bologna, MD;  [facility];  Service: Gastroenterology;  Laterality: N/A;   FLEXIBLE SIGMOIDOSCOPY N/A 11/21/2022   Procedure: FLEXIBLE SIGMOIDOSCOPY;  Surgeon: Lemar Lofty.,  MD;  [facility];  Service: Gastroenterology;  Laterality: N/A;   LAPAROTOMY N/A 12/14/2022   Procedure: EXPLORATORY LAPAROTOMY  WITH SMALL BOWEL RESECTION;  Surgeon: Moise Boring, MD;  Location[facility];  Service: General;  Laterality: N/A;   LAPAROTOMY N/A 12/16/2022   Procedure: REOPENING LAPAROTOMY, ILEOSTOMY;  ABDOMINAL CLOSURE;  Surgeon: Moise Boring, MD;  Location[facility];  Service: General;  Laterality: N/A;   NIPPLE SPARING MASTECTOMY Bilateral 11/09/2020   Procedure: BILATERAL NIPPLE SPARING MASTECTOMY;  Surgeon: Emelia Loron, MD;  Location: [facility];  Service: General;  Laterality: Bilateral;   PORT-A-CATH REMOVAL N/A 07/25/2021   Procedure: REMOVAL PORT-A-CATH;  Surgeon: Allena Napoleon, MD;  Location: [facility];  Service: Plastics;  Laterality: N/A;   PORTACATH PLACEMENT N/A 06/23/2020   Procedure: INSERTION PORT-A-CATH;  Surgeon: Emelia Loron, MD;  Location: [facility];  Service: General;  Laterality: N/A;  START TIME OF 11:00 AM FOR 60 MINUTES [place] [facility]   PORTACATH PLACEMENT Left 05/02/2022   Procedure: INSERTION PORT-A-CATH;  Surgeon: Emelia Loron, MD;  Location: [facility];  Service: General;  Laterality: Left;   RADIOACTIVE SEED GUIDED EXCISIONAL BREAST BIOPSY Right 05/02/2022   Procedure: RADIOACTIVE SEED GUIDED RIGHT BREAST MASS EXCISION;  Surgeon: Emelia Loron, MD;  Location: [facility];  Service: General;  Laterality: Right;   REMOVAL OF BILATERAL TISSUE EXPANDERS WITH PLACEMENT OF BILATERAL BREAST IMPLANTS Bilateral 07/25/2021   Procedure: REMOVAL OF BILATERAL TISSUE EXPANDERS WITH PLACEMENT OF BILATERAL BREAST IMPLANTS;  Surgeon: Allena Napoleon, MD;  Location: [facility];  Service: Plastics;  Laterality: Bilateral;  1.5   HPI:  Per CCM note "47 year old female with past medical history of recurrent breast cancer not currently on treatment, Crohn's disease s/p subtotal colectomy in 2021 recurrent obstructions, colonic stricture, IDA, recent hospitalization 8/10-8/15/2024 for  obstruction seen by GI and general surgery. Underwent EGD and flexible sigmoidoscopy and discharged on steroids who presented to the emergency department on 12/12/2022 with abdominal pain, nausea, vomiting diarrhea. In the ED tachycardic, hypertensive. ED provider notes abdominal distention. She was given 2L IVF, morphine, IV PPI. Labs with WBC 10.8, Hgb 10.7, K 3.2. CT AP was ordered and showed markedly dilated distal small bowel consistent with chronic obstruction. No changes since prior study. Patient was admitted to [facility]. They consulted GI for input. Unfortunately, around 0820 9/4 patient was found on the floor in the bathroom in ED pulseless. They estimate downtime around 5 minutes. ACLS was initiated. Concern for possible torsades de pointes and given IV mag, defibrillated. Coded for around 5 minutes. She was intubated. She had copious vomitus in the ETT. ROSC achieved and ICU was consulted. Notably, she had I-stat peri-code which showed hemoglobin of 5.5 down from 10 overnight."  Pt was extubated 12/18/2021.    Assessment / Plan / Recommendation  Clinical Impression  Repeat clinical swallowing evaluation was completed using ice chips and very small bolus trials of pureed material.  RN approved patient for PO intake and removed large bore NG tube prior to evaluation.  The patient was agreeable for evaluation.  Cranial nerve exam was completed and unremarkable.  Lingual, labial, facial and jaw range of motion appeared to be adequate.  Facial sensation appeared to be intact and she did not endorse a difference in sensation between the right and left side of her face.  Vocal quality remains essentially aphonic with weak cough response.  There was concern for a possible  orpharyngeal dysphagia.  Slow a/p transport was noted.  Immediate cough response was seen given ice chips.  No overt s/s of aspiration were seen on very small boluses of pureed material.  Given clinical presentation, prolonged intubation and  changes to her voice MBS will be completed to assess current swallowing physiology and determine least restrictive diet. SLP Visit Diagnosis: Dysphagia, unspecified (R13.10)    Aspiration Risk  Severe aspiration risk    Diet Recommendation   NPO  Medication Administration: Via alternative means    Other  Recommendations Recommended Consults: Consider ENT evaluation Oral Care Recommendations: Oral care prior to ice chip/H20    Recommendations for follow up therapy are one component of a multi-disciplinary discharge planning process, led by the attending physician.  Recommendations may be updated based on patient status, additional functional criteria and insurance authorization.  Follow up Recommendations  (TBD)         Functional Status Assessment Patient has had a recent decline in their functional status and demonstrates the ability to make significant improvements in function in a reasonable and predictable amount of time.  Frequency and Duration min 2x/week  2 weeks       Prognosis Prognosis for improved oropharyngeal function: Good      Swallow Study   General Date of Onset: 12/13/22 HPI: Per CCM note "47 year old female with past medical history of recurrent breast cancer not currently on treatment, Crohn's disease s/p subtotal colectomy in 2021 recurrent obstructions, colonic stricture, IDA, recent hospitalization 8/10-8/15/2024 for obstruction seen by GI and general surgery. Underwent EGD and flexible sigmoidoscopy and discharged on steroids who presented to the emergency department on 12/12/2022 with abdominal pain, nausea, vomiting diarrhea. In the ED tachycardic, hypertensive. ED provider notes abdominal distention. She was given 2L IVF, morphine, IV PPI. Labs with WBC 10.8, Hgb 10.7, K 3.2. CT AP was ordered and showed markedly dilated distal small bowel consistent with chronic obstruction. No changes since prior study. Patient was admitted to [facility]. They consulted GI for  input. Unfortunately, around 0820 9/4 patient was found on the floor in the bathroom in ED pulseless. They estimate downtime around 5 minutes. ACLS was initiated. Concern for possible torsades de pointes and given IV mag, defibrillated. Coded for around 5 minutes. She was intubated. She had copious vomitus in the ETT. ROSC achieved and ICU was consulted. Notably, she had I-stat peri-code which showed hemoglobin of 5.5 down from 10 overnight."  Pt was extubated 12/18/2021. Type of Study: Bedside Swallow Evaluation Previous Swallow Assessment: 12/20/2022  Limited evaluation. Diet Prior to this Study: Large bore NG tube Temperature Spikes Noted: Yes Respiratory Status: Room air History of Recent Intubation: Yes Total duration of intubation (days): 6 days Date extubated: 12/19/22 Behavior/Cognition: Alert;Cooperative Oral Cavity Assessment: Within Functional Limits Oral Care Completed by SLP: No Oral Cavity - Dentition: Adequate natural dentition Self-Feeding Abilities: Total assist Patient Positioning: Upright in bed Baseline Vocal Quality: Aphonic;Suspected CN X (Vagus) involvement Volitional Cough: Weak Volitional Swallow: Able to elicit    Oral/Motor/Sensory Function Overall Oral Motor/Sensory Function: Within functional limits   Ice Chips Ice chips: Impaired Presentation: Spoon Pharyngeal Phase Impairments: Suspected delayed Swallow;Cough - Immediate   Thin Liquid Thin Liquid: Not tested    Nectar Thick Nectar Thick Liquid: Not tested   Honey Thick Honey Thick Liquid: Not tested   Puree Puree: Impaired Presentation: Spoon Pharyngeal Phase Impairments: Suspected delayed Swallow   Solid     Solid: Not tested     Dimas Aguas, MA, CCC-SLP  Acute Rehab SLP [phone] Fleet Contras 12/23/2022,11:59 AM

## 2022-12-23 NOTE — Progress Notes (Signed)
NAME:  [name], MRN:  [id number], DOB:  06/05/1975, LOS: 10 ADMISSION DATE:  12/12/2022, CONSULTATION DATE:  12/13/2022 REFERRING MD:  Rubin Payor - EDP CHIEF COMPLAINT:  Cardiac arrest    History of Present Illness:  47 year old female with past medical history of recurrent breast cancer not currently on treatment, Crohn's disease s/p subtotal colectomy in 2021 recurrent obstructions, colonic stricture, IDA, recent hospitalization 8/10-8/15/2024 for obstruction seen by GI and general surgery. Underwent EGD and flexible sigmoidoscopy and discharged on steroids who presented to the emergency department on 12/12/2022 with abdominal pain, nausea, vomiting diarrhea. In the ED tachycardic, hypertensive. ED provider notes abdominal distention. She was given 2L IVF, morphine, IV PPI. Labs with WBC 10.8, Hgb 10.7, K 3.2. CT AP was ordered and showed markedly dilated distal small bowel consistent with chronic obstruction. No changes since prior study. Patient was admitted to [facility]. They consulted GI for input. Unfortunately, around 0820 this morning patient was found on the floor in the bathroom in ED pulseless. They estimate downtime around 5 minutes. ACLS was initiated. Concern for possible torsades de pointes and given IV mag, defibrillated. Coded for around 5 minutes. She was intubated. She had copious vomitus in the ETT. ROSC achieved and ICU was consulted. Notably, she had I-stat peri-code which showed hemoglobin of 5.5 down from 10 overnight.   Pertinent Medical History:  Crohn's disease with previous bowel obstruction s/p subtotal colectomy 2021, colonic stricture, IDA, breast cancer s/p mastectomy 2022  Significant Hospital Events: Including procedures, antibiotic start and stop dates in addition to other pertinent events   9/3: Admitted to [facility] for abdominal distention, presumed Crohn's exacerbation 9/4: ~0820 Cardiac arrest in ED. Admitted to ICU intubated, sedated. Hgb 5.5. Placed right femoral CVC.  Received 3 UPRBC, 3 FFP. 9/5: - Hgb stable at 11.7 after 3u PRBC, 3 FPP 9/5 overnight increasing pressor requirements - added vaso. Bladder pressures 28, went for exploratory lap w/ sb resxn, and celiotomy, intra op findings: internal hernia w/ associated small bowel ischemia. Also seen by GI and palliative. Full scope of care desired  9/6 Some ST changes in lateral leads. Weaning pressors. Removing femoral line and placing internal jugular. Renal fxn worse, on-going metabolic acidosis.  9/7: Back to OR for ostomy placement.  9/8: Off levo 9/9: Weaning sedation. Following commands. Tried PS of 5/5 which Vt did not hold up. Giving IV lasix for anasarca, she's 20+ L up on admission. Will try SBT again this afternoon.  9/10: Extubated, +PE on heparin gtt 9/11: Saturation of abdominal dressing and Hgb drift to 6.8 requiring blood transfusion (1U PRBCs). Heparin gtt paused. 9/12: Mental status improved, more interactive in care. Hgb stable/slight drift. Midline abdominal wound with continued bleeding today, vessel in deeper adipose tissue, seen by CCS.  Interim History / Subjective:  No significant events overnight Hgb 5.6 after episode of bleeding  Objective:  Blood pressure (!) 123/91, pulse (!) 131, temperature 99 F (37.2 C), resp. rate 18, height 5\' 4"  (1.626 m), weight 60.9 kg, last menstrual period 04/24/2021, SpO2 98%.        Intake/Output Summary (Last 24 hours) at 12/23/2022 1055 Last data filed at 12/23/2022 4098 Gross per 24 hour  Intake 1742.68 ml  Output 3225 ml  Net -1482.32 ml   Filed Weights   12/21/22 0500 12/22/22 0500 12/23/22 0704  Weight: 65.1 kg 62 kg 60.9 kg   Physical Examination: General: Acute-on-chronically ill-appearing middle aged woman in NAD. More conversant today. HEENT: [place]/AT, anicteric sclera, PERRL,  moist mucous membranes. NGT in place. Neuro: Awake, oriented x 3-4. Responds to verbal stimuli. Following commands consistently. Moves all 4 extremities  spontaneously. Generalized weakness/deconditioning. CV: Tachycardic to 130s (improved from prior), regular rhythm, no m/g/r. PULM: Breathing even and unlabored on room air. Lung fields CTAB. GI: Soft, mildly distended, moderately TTP around midline incision. Midline incision dressed Ileostomy in place with brown output. Hypoactive bowel sounds. Extremities: Bilateral symmetric 1+ pitting LE edema noted. Trace symmetric edema of bilateral hands. Skin: Warm/dry, no rashes. Abdominal wounds as described above.  Resolved Hospital Problem List:  Cardiac arrest  Shock Shock liver Bradycardia  Acute hypoxic respiratory failure   Assessment & Plan:  Sepsis due to presumed intra-abdominal source: Ischemic gut after strength of hernia. -- Continue Zosyn, stop vancomycin 9/13 -- Follow cultures -- MAP goal greater than 65, weaned off pressors   SBO status post ex lap 9/5 and ileostomy creation 9/7: -- Appreciate surgical assistance -- TPN, unlikely to tolerate oral diet long-term -- Ostomy care per wound nursing   Pulmonary embolism: Left lower lobe segmental -- Holding heparin given persistent intermittent bleeding from surgical site, worsening anemia   Tachycardia: Worried about pain response although she denies significant pain. Lot of losses via gut, ostomy. --Increase frequency of scheduled oxycodone 9/13, continue scheduled Tylenol -- Increase as needed oxycodone, encourage use if in pain --fluid bolus, check CVP   ST segment changes on EKG: Unclear etiology -- TTE 9/11 is reassuring -- Continue continuous cardiac monitoring   Hypertension: Suspect pain anxiety etc.  No preceding hypertension. -- Continue metoprolol, as needed labetalol, pain control   AKI: Improving, likely ATN in the setting of severe sepsis/septic shock   Acute on chronic anemia: --Transfuse hemoglobin goal greater than 7, 2 u PRBC 9/14 after bleeding episode from surgical incision with hgb 5.6    Hyperglycemia: -- SSI   Recurrent breast cancer: -- Recommend oncology follow-up once discharged  Best Practice (right click and "Reselect all SmartList Selections" daily)   Diet/type: tubefeeds and TPN DVT prophylaxis: systemic heparin (paused at present 9/13)  GI prophylaxis: PPI Lines: Central line and yes and it is still needed Foley:  Yes, and it is still needed Code Status:  full code Last date of multidisciplinary goals of care discussion: Family (daughter and patient) updated at bedside 9/14  Critical care time:   N/a  Karren Burly, MD [facility] 12/23/22 10:55 AM  Please see [email or web address] for pager details. From 7A-7P if no response, please call [phone] After hours, please call ELink [phone]

## 2022-12-23 NOTE — Progress Notes (Signed)
PT Cancellation Note  Patient Details Name: [name] MRN: [id number] DOB: [birth year]   Cancelled Treatment:     PT order received but eval deferred this date - pt with Hgb 5.6 and transfusion scheduled.  Will follow.[name] PT Acute Rehabilitation Services Pager [phone] Office [phone]    [facility] 12/23/2022, 9:44 AM

## 2022-12-23 NOTE — Procedures (Signed)
Modified Barium Swallow Study  Patient Details  Name: [name] MRN: [id number] Date of Birth: 1975/06/08  Today's Date: 12/23/2022  Modified Barium Swallow completed.  Full report located under Chart Review in the Imaging Section.  History of Present Illness Per CCM note "47 year old female with past medical history of recurrent breast cancer not currently on treatment, Crohn's disease s/p subtotal colectomy in 2021 recurrent obstructions, colonic stricture, IDA, recent hospitalization 8/10-8/15/2024 for obstruction seen by GI and general surgery. Underwent EGD and flexible sigmoidoscopy and discharged on steroids who presented to the emergency department on 12/12/2022 with abdominal pain, nausea, vomiting diarrhea. In the ED tachycardic, hypertensive. ED provider notes abdominal distention. She was given 2L IVF, morphine, IV PPI. Labs with WBC 10.8, Hgb 10.7, K 3.2. CT AP was ordered and showed markedly dilated distal small bowel consistent with chronic obstruction. No changes since prior study. Patient was admitted to [facility]. They consulted GI for input. Unfortunately, around 0820 9/4 patient was found on the floor in the bathroom in ED pulseless. They estimate downtime around 5 minutes. ACLS was initiated. Concern for possible torsades de pointes and given IV mag, defibrillated. Coded for around 5 minutes. She was intubated. She had copious vomitus in the ETT. ROSC achieved and ICU was consulted. Notably, she had I-stat peri-code which showed hemoglobin of 5.5 down from 10 overnight."  Pt was extubated 12/18/2021.   Clinical Impression MBS was completed in the laterial projection using barium impregnated thin liquids via spoon, mildly thick liquid via spoon and cup and pureed material.  She presented with an oral and pharyngeal dysphagia.  Aspiration with a weak cough response was seen given thin liquids via spoon and mildly thick liquids via self fed cup sips.  Suggest patient remain NPO with  comfort feeds of 1/2 tsp bites of pureed material and spoon sips of mildly thick liquids.  Oral care needs to be diligent and compeleted 2-3 t imes per day using a toothbrush and toothpaste.  ST will follow for swallowing therapy.  Please see below for more information regarding swallowing physiology.    ORAL PHASE    1.  Poor lingual control during directed bolus hold.  She was noted to lose material into the buccal area and/or under the tongue prior to the direction to swallow.  This lack of control led to premature loss of the bolus with thin and mildly thick liquids resonating in the pyriform sinuses and pureed material resonating in the vallecular prior to the swallow trigger.   2.  Repetitive/disorganized lingual motion was seen leading to a delay in A/P transport.      PHARYNGEAL PHASE   1.  Significantly reduced anterior hyoid excursion and laryngeal elevation leading to decreased laryngeal vestibule closure.   2.  Gross aspiration of thin liquids via spoon and nectar/mildly thick liquid via self fed cup sip were seen with a weak cough response that was not effective to clear material from the laryngeal vestibule.  Factors that may increase risk of adverse event in presence of aspiration [employer]ye [name] 2021): Poor general health and/or compromised immunity;Respiratory or GI disease;Limited mobility;Weak cough;Frequent aspiration of large volumes  Swallow Evaluation Recommendations Recommendations: NPO (alternative means of nutrition; allowing for some comfort feeds of pureed material 1/2 tsp bites and mildly thick liquid via spoon sips.) Liquid Administration via: Spoon Medication Administration: Via alternative means Swallowing strategies  : Minimize environmental distractions;Slow rate;Small bites/sips Postural changes: Position pt fully upright for meals;Stay upright 30-60 min after meals  Oral care recommendations: Oral care QID (4x/day) Recommended consults: Consider ENT  consultation Caregiver Recommendations: Avoid jello, ice cream, thin soups, popsicles;Have oral suction available     Dimas Aguas, MA, CCC-SLP Acute Rehab SLP [phone]  Fleet Contras 12/23/2022,1:26 PM

## 2022-12-23 NOTE — Progress Notes (Signed)
12/23/2022  [name] [id number] 1975/12/03  CARE TEAM: PCP: Ollen Bowl, MD  Outpatient Care Team: Patient Care Team: Ollen Bowl, MD as PCP - General (Internal Medicine) Maisie Fus, MD as PCP - Cardiology (Cardiology) Pershing Proud, RN as Oncology Nurse Navigator Donnelly Angelica, RN as Oncology Nurse Navigator Romie Levee, MD as Consulting Physician (Colon and Rectal Surgery) Iva Boop, MD as Consulting Physician (Gastroenterology) Emelia Loron, MD as Consulting Physician (General Surgery) Serena Croissant, MD as Consulting Physician (Hematology and Oncology)  Inpatient Treatment Team: Treatment Team:  Hunsucker, Lesia Sago, MD Pccm, Md, MD Ccs, Md, MD Bullins, Benard Halsted, RN Mikki Harbor, RN Randel Books, RN Brien Few, PT Early, Derrel Nip, NT Duane Boston, RN Gerrie Nordmann, RN Carollee Sires I, [place]   Problem List:   Principal Problem:   SBO with strangulation s/p resection 12/14/2022 Active Problems:   Crohn's disease of both small and large intestine with intestinal obstruction (HCC)   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   History of Crohn's disease   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   Malignant neoplasm of upper-outer quadrant of right breast in female, estrogen receptor positive (HCC)   Hypokalemia   Protein-calorie malnutrition, moderate (HCC)   Colonic stricture (HCC)   Exacerbation of Crohn's disease of large intestine (HCC)   Cardiac arrest (HCC)   Septic shock (HCC)   Non-traumatic compartment syndrome of abdomen   Malnutrition of moderate degree   Crohn's disease of small intestine with intestinal obstruction (HCC)  12/14/2022  Pre-operative Diagnoses: intestional stricture Postoperative Diagnoses: internal hernia  Procedure:    EXPLORATORY LAPAROTOMY WITH SMALL BOWEL RESECTION  Surgeons and Role:    * Hillery Hunter Lucilla Edin, MD - Primary    * Romie Levee, MD - Assisting  Operative Findings:  Internal hernia resulting in small bowel ischemia.  Approximately 105cm of small bowel remaining after the frankly necrotic portions were excised.     12/16/2022   Pre-operative Diagnoses: bowel ischemia Postoperative Diagnoses: bowel ischemia   Procedure performed: Re-opening laparotomy, ileostomy creation, abdominal wall closure   Surgeons and Role:    * Hillery Hunter Lucilla Edin, MD - Primary    * Griselda Miner, MD - Assisting   Operative Findings: Remaining small bowel appeared healthy.  No signs of ongoing abdominal contamination.  End ileostomy matured.      Assessment [facility] Stay = 10 days) 7 Days Post-Op    Improving    Plan:  Short gut (105cm jejunum) with end jejunostomy status post massive bowel resection for closed-loop obstruction/necrosis -105cm small bowel left  - likely will be TPN dependent indefinitely.  Can consider start cycling  -High output jejunostomy.  Iron 325mg  BID, Fibercon 625mg  BID started 9/11 to slow ileostomy output down.  Increase loperamide from 2 mg to 4 twice daily.  May eventually need DTO drops or Paregoric.  We will see.    Most likely if she survives and gets discharged would benefit from IV fluid infusions as well as home TPN.  Monitor volume of ostomy output as she will likely need intermittent fluid replacement for this.    -Patient rather hoarse but bright and alert.  Had no prior swallowing problems.  She is mentally clear asking complicated appropriate questions.  I think it is worth retrying swallow eval with all NG tubes out to see if she can start at least swallowing pills.  I am skeptical that she will have enough oral  nutrition on her own and she is going to be high risk with high output jejunostomy.    Will transition to wound VAC to help with the area closed, control drainage, and minimize bleeding with frequent dressing changes.  Patient definitely would benefit from [facility] consult for new ostomy -have not seen him lately so I  reordered  Follow mental status   Iron deficiency anemia on top of chronic disease due to Crohn's.  Transfuse to keep hemoglobin above 7.  ID - zosyn 9/4>>9/11, Zosyn/Vanco 9/13 -->  FEN - CLD, TPN  VTE -diagnosed with pulmonary embolism.  Heparin drip held for bleeding.  Do not know if she needs a IVC filter just in case.  Defer to critical care/primary service - OK to resume heparin drip from surgery standpoint once wound VAC in place today  Foley - present   --per CCM-- Shock - off pressors VDRF Recent cardiac arrest 9/3 AKI - improving Anemia Thrombocytopenia      I reviewed nursing notes, last 24 h vitals and pain scores, last 48 h intake and output, last 24 h labs and trends, last 24 h imaging results, and CCM, SLP,  .  I have reviewed this patient's available data, including medical history, events of note, test results, etc as part of my evaluation.   A significant portion of that time was spent in counseling. Care during the described time interval was provided by me.  This care required high  level of medical decision making.  12/23/2022    Subjective: (Chief complaint)  Patient much more alert today smiling.  Confused and very sedated on Dilaudid.  Trying oxycodone instead  Course but asking appropriate questions.  Family and nurse at bedside.  Pain mostly controlled  Objective:  Vital signs:  Vitals:   12/23/22 0600 12/23/22 0700 12/23/22 0704 12/23/22 0800  BP: (!) 101/55 110/76  112/75  Pulse: (!) 132 (!) 142  (!) 143  Resp: (!) 24 18  (!) 28  Temp: 99.9 F (37.7 C) 99.1 F (37.3 C)  99.1 F (37.3 C)  TempSrc: Bladder     SpO2: 97% 100%  100%  Weight:   60.9 kg   Height:        Last BM Date : 12/21/22  Intake/Output   Yesterday:  09/13 0701 - 09/14 0700 In: 1682.7 [I.V.:1582.9; IV Piggyback:99.8] Out: 3225 [Urine:2025; Stool:1200] This shift:  No intake/output data recorded.  Bowel function:  Flatus: YES  BM:  YES  Drain:  (No drain)   Physical Exam:  General: Pt awake/alert in no acute distress.  Sitting up smiling. Eyes: PERRL, normal EOM.  Sclera clear.  No icterus Neuro: CN II-XII intact w/o focal sensory/motor deficits. Lymph: No head/neck/groin lymphadenopathy Psych:  No delerium/psychosis/paranoia.  Oriented x 4 HENT: Normocephalic, Mucus membranes moist.  No thrush.  Extremely hoarse.  Whispers only.  Understandable phonation. Neck: Supple, No tracheal deviation.  No obvious thyromegaly Chest: No pain to chest wall compression.  Good respiratory excursion.  No audible wheezing CV:  Pulses intact.  Regular rhythm.  No major extremity edema MS: Normal AROM mjr joints.  No obvious deformity  Abdomen: Soft.  Moderately distended.  Mildly tender at incisions only.  No evidence of peritonitis.  No incarcerated hernias.  Incision with old blood and decent granulation.  Jejunostomy and left side pink with thin bilious succus in Foley bag   Ext:   No deformity.  No mjr edema.  No cyanosis Skin: No petechiae / purpurea.  No major sores.  Warm and dry    Results:   Cultures: Recent Results (from the past 720 hour(s))  MRSA Next Gen by PCR, Nasal     Status: None   Collection Time: 12/13/22 10:42 AM   Specimen: Nasal Mucosa; Nasal Swab  Result Value Ref Range Status   MRSA by PCR Next Gen NOT DETECTED NOT DETECTED Final    Comment: (NOTE) The GeneXpert MRSA Assay (FDA approved for NASAL specimens only), is one component of a comprehensive MRSA colonization surveillance program. It is not intended to diagnose MRSA infection nor to guide or monitor treatment for MRSA infections. Test performance is not FDA approved in patients less than 39 years old. Performed at [facility], [address], [place], [place] [address]   Culture, blood (Routine X 2) w Reflex to ID Panel     Status: None   Collection Time: 12/13/22 11:02 AM   Specimen: BLOOD LEFT HAND  Result Value Ref Range  Status   Specimen Description   Final    BLOOD LEFT HAND Performed at [facility], [address], [place], [place] [address]    Special Requests   Final    BOTTLES DRAWN AEROBIC ONLY Blood Culture adequate volume Performed at [facility], [address], [place], [place] [address]    Culture   Final    NO GROWTH 5 DAYS Performed at [facility], [address] [place], [place] [address]    Report Status 12/18/2022 FINAL  Final  Culture, blood (Routine X 2) w Reflex to ID Panel     Status: None   Collection Time: 12/13/22 11:02 AM   Specimen: BLOOD LEFT ARM  Result Value Ref Range Status   Specimen Description   Final    BLOOD LEFT ARM Performed at [facility], [address] [place], [place] [address]    Special Requests   Final    BOTTLES DRAWN AEROBIC ONLY Blood Culture adequate volume Performed at [facility], [address], [place], [place] [address]    Culture   Final    NO GROWTH 5 DAYS Performed at [facility], [address], [place], [place] [address]    Report Status 12/18/2022 FINAL  Final  MRSA Next Gen by PCR, Nasal     Status: None   Collection Time: 12/19/22  8:58 AM   Specimen: Nasal Mucosa; Nasal Swab  Result Value Ref Range Status   MRSA by PCR Next Gen NOT DETECTED NOT DETECTED Final    Comment: (NOTE) The GeneXpert MRSA Assay (FDA approved for NASAL specimens only), is one component of a comprehensive MRSA colonization surveillance program. It is not intended to diagnose MRSA infection nor to guide or monitor treatment for MRSA infections. Test performance is not FDA approved in patients less than 71 years old. Performed at [facility], [address] [place], [place] [address]   Culture, Respiratory w Gram Stain     Status: None   Collection Time: 12/19/22  9:52 AM   Specimen: Tracheal Aspirate; Respiratory  Result Value Ref Range Status   Specimen  Description   Final    TRACHEAL ASPIRATE Performed at [facility], [address] [place], [place] [address]    Special Requests   Final    Immunocompromised Performed at [facility], [address] [place], [place] [address]    Gram Stain  Final    FEW WBC PRESENT, PREDOMINANTLY PMN NO ORGANISMS SEEN    Culture   Final    Normal respiratory flora-no Staph aureus or Pseudomonas seen Performed at [facility], [address] [place], [place] [address]    Report Status 12/21/2022 FINAL  Final    Labs: Results for orders placed or performed during the hospital encounter of 12/12/22 (from the past 48 hour(s))  Comprehensive metabolic panel     Status: Abnormal   Collection Time: 12/21/22 10:27 AM  Result Value Ref Range   Sodium 140 135 - 145 mmol/L   Potassium 4.6 3.5 - 5.1 mmol/L   Chloride 109 98 - 111 mmol/L   CO2 26 22 - 32 mmol/L   Glucose, Bld 149 (H) 70 - 99 mg/dL    Comment: Glucose reference range applies only to samples taken after fasting for at least 8 hours.   BUN 51 (H) 6 - 20 mg/dL   Creatinine, Ser 0.98 (H) 0.44 - 1.00 mg/dL   Calcium 7.6 (L) 8.9 - 10.3 mg/dL   Total Protein 4.2 (L) 6.5 - 8.1 g/dL   Albumin <1.1 (L) 3.5 - 5.0 g/dL   AST 32 15 - 41 U/L   ALT 28 0 - 44 U/L   Alkaline Phosphatase 106 38 - 126 U/L   Total Bilirubin 0.7 0.3 - 1.2 mg/dL   GFR, Estimated 47 (L) >60 mL/min    Comment: (NOTE) Calculated using the CKD-EPI Creatinine Equation (2021)    Anion gap 5 5 - 15    Comment: Performed at [facility], [address] [place], [place] [address]  Hemoglobin and hematocrit, blood     Status: Abnormal   Collection Time: 12/21/22 12:10 PM  Result Value Ref Range   Hemoglobin 8.9 (L) 12.0 - 15.0 g/dL    Comment: REPEATED TO VERIFY POST TRANSFUSION SPECIMEN DELTA CHECK NOTED    HCT 26.7 (L) 36.0 - 46.0 %    Comment: Performed at [facility], [address], [place], [place] [address]  Glucose, capillary     Status: Abnormal   Collection Time: 12/21/22 12:11 PM  Result Value Ref Range   Glucose-Capillary 127 (H) 70 - 99 mg/dL    Comment: Glucose reference range applies only to samples taken after fasting for at least 8 hours.  Hemoglobin and hematocrit, blood     Status: Abnormal   Collection Time: 12/21/22  3:19 PM  Result Value Ref Range   Hemoglobin 8.4 (L) 12.0 - 15.0 g/dL   HCT 13.0 (L) 86.5 - 78.4 %    Comment: Performed at [facility], [address] [place], [place] [address]  Glucose, capillary     Status: Abnormal   Collection Time: 12/21/22  5:35 PM  Result Value Ref Range   Glucose-Capillary 140 (H) 70 - 99 mg/dL    Comment: Glucose reference range applies only to samples taken after fasting for at least 8 hours.  Hemoglobin and hematocrit, blood     Status: Abnormal   Collection Time: 12/21/22  7:45 PM  Result Value Ref Range   Hemoglobin 8.5 (L) 12.0 - 15.0 g/dL   HCT 52.8 (L) 41.3 - 24.4 %    Comment: Performed at [facility], [address] [place], [place] [address]  Hemoglobin and hematocrit, blood     Status: Abnormal   Collection Time: 12/21/22 11:19 PM  Result Value Ref Range   Hemoglobin 7.5 (  L) 12.0 - 15.0 g/dL   HCT 84.6 (L) 96.2 - 95.2 %    Comment: Performed at [facility], [address] [place], [place] [address]  Glucose, capillary     Status: Abnormal   Collection Time: 12/21/22 11:26 PM  Result Value Ref Range   Glucose-Capillary 115 (H) 70 - 99 mg/dL    Comment: Glucose reference range applies only to samples taken after fasting for at least 8 hours.  CBC     Status: Abnormal   Collection Time: 12/22/22  4:45 AM  Result Value Ref Range   WBC 33.9 (H) 4.0 - 10.5 K/uL   RBC 2.71 (L) 3.87 - 5.11 MIL/uL   Hemoglobin 7.9 (L) 12.0 - 15.0 g/dL   HCT 44.0 (L) 10.2 - 72.5 %   MCV 88.2 80.0 - 100.0 fL   MCH 29.2 26.0 - 34.0 pg   MCHC  33.1 30.0 - 36.0 g/dL   RDW 36.6 (H) 44.0 - 34.7 %   Platelets 118 (L) 150 - 400 K/uL   nRBC 0.0 0.0 - 0.2 %    Comment: Performed at [facility], [address] [place], [place] [address]  Basic metabolic panel     Status: Abnormal   Collection Time: 12/22/22  4:45 AM  Result Value Ref Range   Sodium 141 135 - 145 mmol/L   Potassium 4.4 3.5 - 5.1 mmol/L   Chloride 106 98 - 111 mmol/L   CO2 28 22 - 32 mmol/L   Glucose, Bld 127 (H) 70 - 99 mg/dL    Comment: Glucose reference range applies only to samples taken after fasting for at least 8 hours.   BUN 53 (H) 6 - 20 mg/dL   Creatinine, Ser 6.38 (H) 0.44 - 1.00 mg/dL   Calcium 7.9 (L) 8.9 - 10.3 mg/dL   GFR, Estimated 44 (L) >60 mL/min    Comment: (NOTE) Calculated using the CKD-EPI Creatinine Equation (2021)    Anion gap 7 5 - 15    Comment: Performed at [facility], [address], [place], [place] [address]  Phosphorus     Status: None   Collection Time: 12/22/22  4:45 AM  Result Value Ref Range   Phosphorus 3.1 2.5 - 4.6 mg/dL    Comment: Performed at [facility], [address] [place], [place] [address]  Magnesium     Status: None   Collection Time: 12/22/22  4:45 AM  Result Value Ref Range   Magnesium 2.0 1.7 - 2.4 mg/dL    Comment: Performed at [facility], [address] [place], [place] [address]  Glucose, capillary     Status: Abnormal   Collection Time: 12/22/22  4:59 AM  Result Value Ref Range   Glucose-Capillary 114 (H) 70 - 99 mg/dL    Comment: Glucose reference range applies only to samples taken after fasting for at least 8 hours.  Glucose, capillary     Status: Abnormal   Collection Time: 12/22/22 11:21 AM  Result Value Ref Range   Glucose-Capillary 129 (H) 70 - 99 mg/dL    Comment: Glucose reference range applies only to samples taken after fasting for at least 8 hours.   Comment 1 Notify RN    Comment 2 Document in Chart    Protime-INR     Status: Abnormal   Collection Time: 12/22/22  1:43 PM  Result Value Ref Range   Prothrombin Time 16.3 (H) 11.4 - 15.2  seconds   INR 1.3 (H) 0.8 - 1.2    Comment: (NOTE) INR goal varies based on device and disease states. Performed at [facility], [address], [place], [place] [address]   APTT     Status: None   Collection Time: 12/22/22  1:43 PM  Result Value Ref Range   aPTT 33 24 - 36 seconds    Comment: Performed at [facility], [address], [place], [place] [address]  Fibrinogen     Status: Abnormal   Collection Time: 12/22/22  1:43 PM  Result Value Ref Range   Fibrinogen 741 (H) 210 - 475 mg/dL    Comment: (NOTE) Fibrinogen results may be underestimated in patients receiving thrombolytic therapy. Performed at [facility], [address], [place], [place] [address]   Glucose, capillary     Status: Abnormal   Collection Time: 12/22/22  6:28 PM  Result Value Ref Range   Glucose-Capillary 105 (H) 70 - 99 mg/dL    Comment: Glucose reference range applies only to samples taken after fasting for at least 8 hours.   Comment 1 Notify RN    Comment 2 Document in Chart   Glucose, capillary     Status: Abnormal   Collection Time: 12/22/22 11:10 PM  Result Value Ref Range   Glucose-Capillary 101 (H) 70 - 99 mg/dL    Comment: Glucose reference range applies only to samples taken after fasting for at least 8 hours.   Comment 1 Notify RN    Comment 2 Document in Chart   Glucose, capillary     Status: Abnormal   Collection Time: 12/23/22  5:14 AM  Result Value Ref Range   Glucose-Capillary 119 (H) 70 - 99 mg/dL    Comment: Glucose reference range applies only to samples taken after fasting for at least 8 hours.   Comment 1 Notify RN    Comment 2 Document in Chart   CBC     Status: Abnormal   Collection Time: 12/23/22  5:20 AM  Result Value Ref Range   WBC 29.7 (H) 4.0 - 10.5 K/uL   RBC 1.97 (L)  3.87 - 5.11 MIL/uL   Hemoglobin 5.6 (LL) 12.0 - 15.0 g/dL    Comment: REPEATED TO VERIFY THIS CRITICAL RESULT HAS VERIFIED AND BEEN CALLED TO Loney Loh RN BY XIONG,KONG ON 09 14 2024 AT 0657, AND HAS BEEN READ BACK.     HCT 17.6 (L) 36.0 - 46.0 %   MCV 89.3 80.0 - 100.0 fL   MCH 28.4 26.0 - 34.0 pg   MCHC 31.8 30.0 - 36.0 g/dL   RDW 44.0 (H) 34.7 - 42.5 %   Platelets 137 (L) 150 - 400 K/uL   nRBC 0.0 0.0 - 0.2 %    Comment: Performed at [facility], [address], [place], [place] [address]  Protime-INR     Status: Abnormal   Collection Time: 12/23/22  5:20 AM  Result Value Ref Range   Prothrombin Time 16.8 (H) 11.4 - 15.2 seconds   INR 1.3 (H) 0.8 - 1.2    Comment: (NOTE) INR goal varies based on device and disease states. Performed at [facility], [address], [place], [place] [address]   Prepare RBC (crossmatch)     Status: None   Collection Time: 12/23/22  7:34 AM  Result Value Ref Range   Order Confirmation      ORDER PROCESSED BY BLOOD BANK Performed  at [facility], [address], [place], [place] [address]     Imaging / Studies: DG Chest Port 1 View  Result Date: 12/22/2022 CLINICAL DATA:  Acute respiratory failure, chest pain, crackles at right lung base. EXAM: PORTABLE CHEST 1 VIEW COMPARISON:  12/19/2022. FINDINGS: The heart size and mediastinal contours are stable. A left chest port and right central venous catheter terminates over the superior vena cava. Hazy opacities are noted in the mid to lower lung fields bilaterally suggesting moderate layering pleural effusions. Airspace disease is noted at the lung bases. No pneumothorax. An enteric tube terminates in the stomach. IMPRESSION: Hazy opacities in the mid to lower lung fields bilaterally likely reflecting moderate layering pleural effusions with atelectasis or infiltrate. Electronically Signed   By: Thornell Sartorius M.D.   On: 12/22/2022 04:45     Medications / Allergies: per chart  Antibiotics: Anti-infectives (From admission, onward)    Start     Dose/Rate Route Frequency Ordered Stop   12/23/22 2000  vancomycin (VANCOREADY) IVPB 1250 mg/250 mL  Status:  Discontinued        1,250 mg 166.7 mL/hr over 90 Minutes Intravenous Every 36 hours 12/22/22 0612 12/22/22 1103   12/22/22 0645  vancomycin (VANCOREADY) IVPB 1250 mg/250 mL        1,250 mg 166.7 mL/hr over 90 Minutes Intravenous  Once 12/22/22 0551 12/22/22 0805   12/22/22 0600  piperacillin-tazobactam (ZOSYN) IVPB 3.375 g        3.375 g 12.5 mL/hr over 240 Minutes Intravenous Every 8 hours 12/22/22 0551     12/13/22 1000  piperacillin-tazobactam (ZOSYN) IVPB 3.375 g  Status:  Discontinued        3.375 g 12.5 mL/hr over 240 Minutes Intravenous Every 8 hours 12/13/22 6962 12/20/22 1333         Note: Portions of this report may have been transcribed using voice recognition software. Every effort was made to ensure accuracy; however, inadvertent computerized transcription errors may be present.   Any transcriptional errors that result from this process are unintentional.    Ardeth Sportsman, MD, FACS, MASCRS Esophageal, Gastrointestinal & Colorectal Surgery Robotic and Minimally Invasive Surgery  [facility] [address] [place], [place] [address] [phone] Fax [phone] Main  CONTACT INFORMATION: Weekday (9AM-5PM): Call [facility] main office at [phone] Weeknight (5PM-9AM) or Weekend/Holiday: Check EPIC "Web Links" tab & use "AMION" (password " [email or web address]") for General Surgery [facility] coverage  Please, DO NOT use SecureChat  (it is not reliable communication to reach operating surgeons & will lead to a delay in care).   Epic staff messaging available for outptient concerns needing 1-2 business day response.      12/23/2022  8:37 AM

## 2022-12-23 NOTE — Progress Notes (Incomplete)
Patient alerted me that her gown was saturated with blood. Upon assessing her dressing I noticed that blood had not saturated through the dressing but was leaking from the bottom of the dressing. I applied guaze to the area and contacted Elink. Orders were placed for CBC. I aslo contacted Dr. Karie Soda, General Surgery On-Call.

## 2022-12-24 ENCOUNTER — Telehealth: Payer: Self-pay | Admitting: Internal Medicine

## 2022-12-24 DIAGNOSIS — K90829 Short bowel syndrome, unspecified: Secondary | ICD-10-CM | POA: Diagnosis not present

## 2022-12-24 DIAGNOSIS — E440 Moderate protein-calorie malnutrition: Secondary | ICD-10-CM | POA: Insufficient documentation

## 2022-12-24 LAB — GLUCOSE, CAPILLARY
Glucose-Capillary: 117 mg/dL — ABNORMAL HIGH (ref 70–99)
Glucose-Capillary: 96 mg/dL (ref 70–99)

## 2022-12-24 LAB — BASIC METABOLIC PANEL
Anion gap: 8 (ref 5–15)
BUN: 52 mg/dL — ABNORMAL HIGH (ref 6–20)
CO2: 24 mmol/L (ref 22–32)
Calcium: 7.9 mg/dL — ABNORMAL LOW (ref 8.9–10.3)
Chloride: 107 mmol/L (ref 98–111)
Creatinine, Ser: 1.48 mg/dL — ABNORMAL HIGH (ref 0.44–1.00)
GFR, Estimated: 44 mL/min — ABNORMAL LOW (ref >60)
Glucose, Bld: 121 mg/dL — ABNORMAL HIGH (ref 70–99)
Potassium: 4.3 mmol/L (ref 3.5–5.1)
Sodium: 139 mmol/L (ref 135–145)

## 2022-12-24 LAB — CBC
HCT: 25.4 % — ABNORMAL LOW (ref 36.0–46.0)
Hemoglobin: 8.6 g/dL — ABNORMAL LOW (ref 12.0–15.0)
MCH: 30 pg (ref 26.0–34.0)
MCHC: 33.9 g/dL (ref 30.0–36.0)
MCV: 88.5 fL (ref 80.0–100.0)
Platelets: 157 10*3/uL (ref 150–400)
RBC: 2.87 MIL/uL — ABNORMAL LOW (ref 3.87–5.11)
RDW: 16.7 % — ABNORMAL HIGH (ref 11.5–15.5)
WBC: 27.4 10*3/uL — ABNORMAL HIGH (ref 4.0–10.5)
nRBC: 0 % (ref 0.0–0.2)

## 2022-12-24 LAB — PROTIME-INR
INR: 1.3 — ABNORMAL HIGH (ref 0.8–1.2)
Prothrombin Time: 16.8 s — ABNORMAL HIGH (ref 11.4–15.2)

## 2022-12-24 LAB — APTT: aPTT: 36 s (ref 24–36)

## 2022-12-24 LAB — PHOSPHORUS: Phosphorus: 4 mg/dL (ref 2.5–4.6)

## 2022-12-24 LAB — FIBRINOGEN: Fibrinogen: 560 mg/dL — ABNORMAL HIGH (ref 210–475)

## 2022-12-24 MED ORDER — PANTOPRAZOLE SODIUM 40 MG PO TBEC
40.0000 mg | DELAYED_RELEASE_TABLET | Freq: Every day | ORAL | Status: DC
  Administered 2022-12-24: 40 mg via ORAL
  Filled 2022-12-24 (×2): qty 1

## 2022-12-24 MED ORDER — SODIUM CHLORIDE 0.9 % IV SOLN
125.0000 mg | Freq: Once | INTRAVENOUS | Status: AC
  Administered 2022-12-24: 125 mg via INTRAVENOUS
  Filled 2022-12-24: qty 10

## 2022-12-24 MED ORDER — TRAVASOL 10 % IV SOLN
INTRAVENOUS | Status: AC
  Filled 2022-12-24: qty 940.8

## 2022-12-24 MED ORDER — LACTATED RINGERS IV BOLUS
500.0000 mL | Freq: Three times a day (TID) | INTRAVENOUS | Status: AC
  Administered 2022-12-24 (×2): 500 mL via INTRAVENOUS

## 2022-12-24 MED ORDER — INSULIN ASPART 100 UNIT/ML IJ SOLN
0.0000 [IU] | INTRAMUSCULAR | Status: DC
  Administered 2022-12-25 – 2022-12-29 (×6): 1 [IU] via SUBCUTANEOUS
  Administered 2022-12-30: 2 [IU] via SUBCUTANEOUS
  Administered 2022-12-30 – 2023-01-02 (×4): 1 [IU] via SUBCUTANEOUS
  Administered 2023-01-02: 2 [IU] via SUBCUTANEOUS
  Administered 2023-01-03: 1 [IU] via SUBCUTANEOUS
  Administered 2023-01-03: 2 [IU] via SUBCUTANEOUS
  Administered 2023-01-07 – 2023-01-08 (×2): 1 [IU] via SUBCUTANEOUS
  Administered 2023-01-09: 2 [IU] via SUBCUTANEOUS
  Administered 2023-01-10: 1 [IU] via SUBCUTANEOUS
  Administered 2023-01-11: 2 [IU] via SUBCUTANEOUS
  Administered 2023-01-11 – 2023-01-12 (×2): 1 [IU] via SUBCUTANEOUS
  Administered 2023-01-12: 2 [IU] via SUBCUTANEOUS
  Administered 2023-01-12: 1 [IU] via SUBCUTANEOUS
  Administered 2023-01-13: 2 [IU] via SUBCUTANEOUS
  Administered 2023-01-13 – 2023-01-15 (×4): 1 [IU] via SUBCUTANEOUS

## 2022-12-24 MED ORDER — ACETAMINOPHEN 160 MG/5ML PO SOLN
1000.0000 mg | Freq: Three times a day (TID) | ORAL | Status: AC
  Administered 2022-12-24 – 2022-12-26 (×8): 1000 mg
  Filled 2022-12-24 (×9): qty 40.6

## 2022-12-24 MED ORDER — ALBUMIN HUMAN 25 % IV SOLN
12.5000 g | Freq: Four times a day (QID) | INTRAVENOUS | Status: AC
  Administered 2022-12-24 (×3): 12.5 g via INTRAVENOUS
  Filled 2022-12-24 (×3): qty 50

## 2022-12-24 NOTE — Progress Notes (Signed)
NAME:  [name], MRN:  [id number], DOB:  February 04, 1976, LOS: 11 ADMISSION DATE:  12/12/2022, CONSULTATION DATE:  12/13/2022 REFERRING MD:  Rubin Payor - EDP CHIEF COMPLAINT:  Cardiac arrest    History of Present Illness:  47 year old female with past medical history of recurrent breast cancer not currently on treatment, Crohn's disease s/p subtotal colectomy in 2021 recurrent obstructions, colonic stricture, IDA, recent hospitalization 8/10-8/15/2024 for obstruction seen by GI and general surgery. Underwent EGD and flexible sigmoidoscopy and discharged on steroids who presented to the emergency department on 12/12/2022 with abdominal pain, nausea, vomiting diarrhea. In the ED tachycardic, hypertensive. ED provider notes abdominal distention. She was given 2L IVF, morphine, IV PPI. Labs with WBC 10.8, Hgb 10.7, K 3.2. CT AP was ordered and showed markedly dilated distal small bowel consistent with chronic obstruction. No changes since prior study. Patient was admitted to [facility]. They consulted GI for input. Unfortunately, around 0820 this morning patient was found on the floor in the bathroom in ED pulseless. They estimate downtime around 5 minutes. ACLS was initiated. Concern for possible torsades de pointes and given IV mag, defibrillated. Coded for around 5 minutes. She was intubated. She had copious vomitus in the ETT. ROSC achieved and ICU was consulted. Notably, she had I-stat peri-code which showed hemoglobin of 5.5 down from 10 overnight.   Pertinent Medical History:  Crohn's disease with previous bowel obstruction s/p subtotal colectomy 2021, colonic stricture, IDA, breast cancer s/p mastectomy 2022  Significant Hospital Events: Including procedures, antibiotic start and stop dates in addition to other pertinent events   9/3: Admitted to [facility] for abdominal distention, presumed Crohn's exacerbation 9/4: ~0820 Cardiac arrest in ED. Admitted to ICU intubated, sedated. Hgb 5.5. Placed right femoral CVC.  Received 3 UPRBC, 3 FFP. 9/5: - Hgb stable at 11.7 after 3u PRBC, 3 FPP 9/5 overnight increasing pressor requirements - added vaso. Bladder pressures 28, went for exploratory lap w/ sb resxn, and celiotomy, intra op findings: internal hernia w/ associated small bowel ischemia. Also seen by GI and palliative. Full scope of care desired  9/6 Some ST changes in lateral leads. Weaning pressors. Removing femoral line and placing internal jugular. Renal fxn worse, on-going metabolic acidosis.  9/7: Back to OR for ostomy placement.  9/8: Off levo 9/9: Weaning sedation. Following commands. Tried PS of 5/5 which Vt did not hold up. Giving IV lasix for anasarca, she's 20+ L up on admission. Will try SBT again this afternoon.  9/10: Extubated, +PE on heparin gtt 9/11: Saturation of abdominal dressing and Hgb drift to 6.8 requiring blood transfusion (1U PRBCs). Heparin gtt paused. 9/12: Mental status improved, more interactive in care. Hgb stable/slight drift. Midline abdominal wound with continued bleeding today, vessel in deeper adipose tissue, seen by CCS.  Interim History / Subjective:  No significant events overnight Hgb better after transfusion. Mild improvement in sinus tach with additional fluids. More bleeding from incision overnight.  Objective:  Blood pressure 117/81, pulse (!) 121, temperature 99.5 F (37.5 C), resp. rate 16, height 5\' 4"  (1.626 m), weight 64.7 kg, last menstrual period 04/24/2021, SpO2 100%. CVP:  [3 mmHg] 3 mmHg      Intake/Output Summary (Last 24 hours) at 12/24/2022 0808 Last data filed at 12/24/2022 4098 Gross per 24 hour  Intake 3430.45 ml  Output 3100 ml  Net 330.45 ml   Filed Weights   12/22/22 0500 12/23/22 0704 12/24/22 0425  Weight: 62 kg 60.9 kg 64.7 kg   Physical Examination: General: Acute-on-chronically  ill-appearing middle aged woman in NAD. More conversant today. HEENT: [place]/AT, anicteric sclera, PERRL, moist mucous membranes. NGT in place. Neuro:  Awake, oriented x 3-4. Responds to verbal stimuli. Following commands consistently. Moves all 4 extremities spontaneously. Generalized weakness/deconditioning. CV: Tachycardic to 120s (improved from prior), regular rhythm, no m/g/r. PULM: Breathing even and unlabored on room air. Lung fields CTAB. GI: Soft, mildly distended, moderately TTP around midline incision. Midline incision dressed Ileostomy in place with brown output. Hypoactive bowel sounds. Extremities: Bilateral symmetric 1+ pitting LE edema noted. Trace symmetric edema of bilateral hands. Skin: Warm/dry, no rashes. Abdominal wounds as described above.  Resolved Hospital Problem List:  Cardiac arrest  Shock Shock liver Bradycardia  Acute hypoxic respiratory failure   Assessment & Plan:  Sepsis due to presumed intra-abdominal source: Ischemic gut after strength of hernia. -- Continue Zosyn, stop vancomycin 9/13 -- Follow cultures -- MAP goal greater than 65, weaned off pressors   SBO status post ex lap 9/5 and ileostomy creation 9/7: -- Appreciate surgical assistance -- TPN, unlikely to tolerate oral diet long-term -- Ostomy care per wound nursing -- Failed SLT eval with poor mechanics, gross aspiration   Pulmonary embolism: Left lower lobe segmental -- Holding heparin given persistent intermittent bleeding from surgical site, worsening anemia   Tachycardia: Worried about pain response although she denies significant pain. Lot of losses via gut, ostomy.  Slightly improved with fluids.  Likely large contribution of hypovolemia. --Increase frequency of scheduled oxycodone 9/13, continue scheduled Tylenol -- Increase as needed oxycodone 9/13, encourage use if in pain --fluid bolus, check CVP   ST segment changes on EKG: Unclear etiology -- TTE 9/11 is reassuring -- Continue continuous cardiac monitoring   Hypertension: Suspect pain anxiety etc.  No preceding hypertension. -- Continue metoprolol, as needed labetalol, pain  control   AKI: Improving, likely ATN in the setting of severe sepsis/septic shock   Acute on chronic anemia: --Transfuse hemoglobin goal greater than 7, 2 u PRBC 9/14 after bleeding episode from surgical incision with hgb 5.6   Hyperglycemia: -- SSI   Recurrent breast cancer: -- Recommend oncology follow-up once discharged  Best Practice (right click and "Reselect all SmartList Selections" daily)   Diet/type: tubefeeds and TPN DVT prophylaxis: systemic heparin (paused at present 9/13)  GI prophylaxis: PPI Lines: Central line and yes and it is still needed Foley:  Yes, and it is still needed Code Status:  full code Last date of multidisciplinary goals of care discussion: Patient updated at bedside 9/14  Critical care time:   N/a  Karren Burly, MD [facility] 12/24/22 8:08 AM  Please see [email or web address] for pager details. From 7A-7P if no response, please call [phone] After hours, please call ELink [phone]

## 2022-12-24 NOTE — Progress Notes (Addendum)
12/24/2022  [name] [id number] [birth year]  CARE TEAM: PCP: Ollen Bowl, MD  Outpatient Care Team: Patient Care Team: Ollen Bowl, MD as PCP - General (Internal Medicine) Maisie Fus, MD as PCP - Cardiology (Cardiology) Pershing Proud, RN as Oncology Nurse Navigator Donnelly Angelica, RN as Oncology Nurse Navigator Romie Levee, MD as Consulting Physician (Colon and Rectal Surgery) Iva Boop, MD as Consulting Physician (Gastroenterology) Emelia Loron, MD as Consulting Physician (General Surgery) Serena Croissant, MD as Consulting Physician (Hematology and Oncology)  Inpatient Treatment Team: Treatment Team:  Hunsucker, Lesia Sago, MD Pccm, Md, MD Ccs, Md, MD Bullins, Benard Halsted, RN Brien Few, PT Elmer Bales, RN Early, Derrel Nip, NT Duane Boston, RN   Problem List:   Principal Problem:   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 Active Problems:   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   Crohn's disease of both small and large intestine with intestinal obstruction (HCC)   SBO with strangulation s/p resection 12/14/2022   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   Malignant neoplasm of upper-outer quadrant of right breast in female, estrogen receptor positive (HCC)   Hypokalemia   Port-A-Cath in place   Breast cancer (HCC)   Chronic disease anemia   Protein-calorie malnutrition, severe (HCC)   Failure to thrive in adult   Anorexia   Cardiac arrest (HCC)   Septic shock (HCC)  12/14/2022  Pre-operative Diagnoses: intestional stricture Postoperative Diagnoses: internal hernia  Procedure:    EXPLORATORY LAPAROTOMY WITH SMALL BOWEL RESECTION  Surgeons and Role:    * Hillery Hunter Lucilla Edin, MD - Primary    * Romie Levee, MD - Assisting  Operative Findings: Internal hernia resulting in small bowel ischemia.  Approximately 105cm of small bowel remaining after the frankly necrotic portions were excised.      12/16/2022   Pre-operative Diagnoses: bowel ischemia Postoperative Diagnoses: bowel ischemia   Procedure performed: Re-opening laparotomy, ileostomy creation, abdominal wall closure   Surgeons and Role:    * Hillery Hunter Lucilla Edin, MD - Primary    * Griselda Miner, MD - Assisting   Operative Findings: Remaining small bowel appeared healthy.  No signs of ongoing abdominal contamination.  End ileostomy matured.      Assessment [facility] Stay = 11 days) 8 Days Post-Op    Stabilizing   Plan:  Short gut (105cm jejunum remains) with end jejunostomy status post massive bowel resection for closed-loop obstruction/necrosis  I am skeptical that she will have enough small bowel to absorb adequate oral nutrition on her own.  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  Can consider start cycling TPN  High output jejunostomy.  Iron/Fiber/loperamide BID started 9/11 to slow jejunostomy output down.  Increased loperamide from 2 mg to 4 twice daily 9/14.  Output 1 L yesterday, improved.  Keep at this level for now.  Most likely will need to increase loperamide if she attempts p.o. later this week.  may eventually need DTO drops or Paregoric.  We will see.    Most likely if she survives and gets discharged would benefit from IV fluid infusions as well as home TPN.  Monitor volume of ostomy output as she will likely need intermittent fluid replacement for this.    Patient rather hoarse but bright and alert.  Had no prior swallowing problems.  She is mentally clear asking complicated appropriate questions.  Speech therapy reevaluated and still concerned about swallowing aspiration risks.  Will  12/24/2022  [name] [id number] [birth year]  CARE TEAM: PCP: Ollen Bowl, MD  Outpatient Care Team: Patient Care Team: Ollen Bowl, MD as PCP - General (Internal Medicine) Maisie Fus, MD as PCP - Cardiology (Cardiology) Pershing Proud, RN as Oncology Nurse Navigator Donnelly Angelica, RN as Oncology Nurse Navigator Romie Levee, MD as Consulting Physician (Colon and Rectal Surgery) Iva Boop, MD as Consulting Physician (Gastroenterology) Emelia Loron, MD as Consulting Physician (General Surgery) Serena Croissant, MD as Consulting Physician (Hematology and Oncology)  Inpatient Treatment Team: Treatment Team:  Hunsucker, Lesia Sago, MD Pccm, Md, MD Ccs, Md, MD Bullins, Benard Halsted, RN Brien Few, PT Elmer Bales, RN Early, Derrel Nip, NT Duane Boston, RN   Problem List:   Principal Problem:   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 Active Problems:   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   Crohn's disease of both small and large intestine with intestinal obstruction (HCC)   SBO with strangulation s/p resection 12/14/2022   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   Malignant neoplasm of upper-outer quadrant of right breast in female, estrogen receptor positive (HCC)   Hypokalemia   Port-A-Cath in place   Breast cancer (HCC)   Chronic disease anemia   Protein-calorie malnutrition, severe (HCC)   Failure to thrive in adult   Anorexia   Cardiac arrest (HCC)   Septic shock (HCC)  12/14/2022  Pre-operative Diagnoses: intestional stricture Postoperative Diagnoses: internal hernia  Procedure:    EXPLORATORY LAPAROTOMY WITH SMALL BOWEL RESECTION  Surgeons and Role:    * Hillery Hunter Lucilla Edin, MD - Primary    * Romie Levee, MD - Assisting  Operative Findings: Internal hernia resulting in small bowel ischemia.  Approximately 105cm of small bowel remaining after the frankly necrotic portions were excised.      12/16/2022   Pre-operative Diagnoses: bowel ischemia Postoperative Diagnoses: bowel ischemia   Procedure performed: Re-opening laparotomy, ileostomy creation, abdominal wall closure   Surgeons and Role:    * Hillery Hunter Lucilla Edin, MD - Primary    * Griselda Miner, MD - Assisting   Operative Findings: Remaining small bowel appeared healthy.  No signs of ongoing abdominal contamination.  End ileostomy matured.      Assessment [facility] Stay = 11 days) 8 Days Post-Op    Stabilizing   Plan:  Short gut (105cm jejunum remains) with end jejunostomy status post massive bowel resection for closed-loop obstruction/necrosis  I am skeptical that she will have enough small bowel to absorb adequate oral nutrition on her own.  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  Can consider start cycling TPN  High output jejunostomy.  Iron/Fiber/loperamide BID started 9/11 to slow jejunostomy output down.  Increased loperamide from 2 mg to 4 twice daily 9/14.  Output 1 L yesterday, improved.  Keep at this level for now.  Most likely will need to increase loperamide if she attempts p.o. later this week.  may eventually need DTO drops or Paregoric.  We will see.    Most likely if she survives and gets discharged would benefit from IV fluid infusions as well as home TPN.  Monitor volume of ostomy output as she will likely need intermittent fluid replacement for this.    Patient rather hoarse but bright and alert.  Had no prior swallowing problems.  She is mentally clear asking complicated appropriate questions.  Speech therapy reevaluated and still concerned about swallowing aspiration risks.  Will  [id number]  Fibrinogen (coagulopathy lab panel)     Status: Abnormal   Collection Time: 12/24/22  4:08 AM  Result Value Ref Range   Fibrinogen 560 (H) 210 - 475 mg/dL    Comment: (NOTE) Fibrinogen results may be underestimated in patients receiving thrombolytic therapy. Performed at [facility], [address] [place], [place] [address]   Protime-INR (coagulopathy lab panel)     Status: Abnormal   Collection Time: 12/24/22  4:08 AM  Result Value Ref Range   Prothrombin Time 16.8 (H) 11.4 - 15.2 seconds   INR 1.3 (H) 0.8 - 1.2    Comment: (NOTE) INR goal varies based on device and disease states. Performed at [facility], [address] [place], [place] [address]   APTT (coagulopathy lab panel)     Status: None   Collection Time: 12/24/22  4:08 AM  Result Value Ref  Range   aPTT 36 24 - 36 seconds    Comment: Performed at [facility], [address], [place], [place] [address]    Imaging / Studies: DG Abd 1 View  Result Date: 12/23/2022 CLINICAL DATA:  NG tube placement. EXAM: ABDOMEN - 1 VIEW COMPARISON:  12/19/2022. FINDINGS: Enteric tube passes below the diaphragm, through the stomach into the right mid abdomen projecting the level of the duodenal bulb/pylorus. No bowel dilation. IMPRESSION: 1. Enteric feeding tube tip projects the level of the pylorus/duodenal bulb. Electronically Signed   By: Amie Portland M.D.   On: 12/23/2022 15:12   DG Swallowing Func-Speech Pathology  Result Date: 12/23/2022 Table formatting from the original result was not included. Modified Barium Swallow Study Patient Details Name: [name] MRN: [id number] Date of Birth: 09-06-1975 Today's Date: 12/23/2022 HPI/PMH: HPI: Per CCM note "47 year old female with past medical history of recurrent breast cancer not currently on treatment, Crohn's disease s/p subtotal colectomy in 2021 recurrent obstructions, colonic stricture, IDA, recent hospitalization 8/10-8/15/2024 for obstruction seen by GI and general surgery. Underwent EGD and flexible sigmoidoscopy and discharged on steroids who presented to the emergency department on 12/12/2022 with abdominal pain, nausea, vomiting diarrhea. In the ED tachycardic, hypertensive. ED provider notes abdominal distention. She was given 2L IVF, morphine, IV PPI. Labs with WBC 10.8, Hgb 10.7, K 3.2. CT AP was ordered and showed markedly dilated distal small bowel consistent with chronic obstruction. No changes since prior study. Patient was admitted to [facility]. They consulted GI for input. Unfortunately, around 0820 9/4 patient was found on the floor in the bathroom in ED pulseless. They estimate downtime around 5 minutes. ACLS was initiated. Concern for possible torsades de pointes and given IV mag, defibrillated. Coded for around 5  minutes. She was intubated. She had copious vomitus in the ETT. ROSC achieved and ICU was consulted. Notably, she had I-stat peri-code which showed hemoglobin of 5.5 down from 10 overnight."  Pt was extubated 12/18/2021. Clinical Impression: Clinical Impression: MBS was completed in the laterial projection using barium impregnated thin liquids via spoon, mildly thick liquid via spoon and cup and pureed material.  She presented with an oral and pharyngeal dysphagia.  Aspiration with a weak cough response was seen given thin liquids via spoon and mildly thick liquids via self fed cup sips.  Suggest patient remain NPO with comfort feeds of 1/2 tsp bites of pureed material and spoon sips of mildly thick liquids.  Oral care needs to be diligent and compeleted 2-3 t imes per day using a toothbrush and toothpaste.  12/24/2022  [name] [id number] [birth year]  CARE TEAM: PCP: Ollen Bowl, MD  Outpatient Care Team: Patient Care Team: Ollen Bowl, MD as PCP - General (Internal Medicine) Maisie Fus, MD as PCP - Cardiology (Cardiology) Pershing Proud, RN as Oncology Nurse Navigator Donnelly Angelica, RN as Oncology Nurse Navigator Romie Levee, MD as Consulting Physician (Colon and Rectal Surgery) Iva Boop, MD as Consulting Physician (Gastroenterology) Emelia Loron, MD as Consulting Physician (General Surgery) Serena Croissant, MD as Consulting Physician (Hematology and Oncology)  Inpatient Treatment Team: Treatment Team:  Hunsucker, Lesia Sago, MD Pccm, Md, MD Ccs, Md, MD Bullins, Benard Halsted, RN Brien Few, PT Elmer Bales, RN Early, Derrel Nip, NT Duane Boston, RN   Problem List:   Principal Problem:   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 Active Problems:   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   Crohn's disease of both small and large intestine with intestinal obstruction (HCC)   SBO with strangulation s/p resection 12/14/2022   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   Malignant neoplasm of upper-outer quadrant of right breast in female, estrogen receptor positive (HCC)   Hypokalemia   Port-A-Cath in place   Breast cancer (HCC)   Chronic disease anemia   Protein-calorie malnutrition, severe (HCC)   Failure to thrive in adult   Anorexia   Cardiac arrest (HCC)   Septic shock (HCC)  12/14/2022  Pre-operative Diagnoses: intestional stricture Postoperative Diagnoses: internal hernia  Procedure:    EXPLORATORY LAPAROTOMY WITH SMALL BOWEL RESECTION  Surgeons and Role:    * Hillery Hunter Lucilla Edin, MD - Primary    * Romie Levee, MD - Assisting  Operative Findings: Internal hernia resulting in small bowel ischemia.  Approximately 105cm of small bowel remaining after the frankly necrotic portions were excised.      12/16/2022   Pre-operative Diagnoses: bowel ischemia Postoperative Diagnoses: bowel ischemia   Procedure performed: Re-opening laparotomy, ileostomy creation, abdominal wall closure   Surgeons and Role:    * Hillery Hunter Lucilla Edin, MD - Primary    * Griselda Miner, MD - Assisting   Operative Findings: Remaining small bowel appeared healthy.  No signs of ongoing abdominal contamination.  End ileostomy matured.      Assessment [facility] Stay = 11 days) 8 Days Post-Op    Stabilizing   Plan:  Short gut (105cm jejunum remains) with end jejunostomy status post massive bowel resection for closed-loop obstruction/necrosis  I am skeptical that she will have enough small bowel to absorb adequate oral nutrition on her own.  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  Can consider start cycling TPN  High output jejunostomy.  Iron/Fiber/loperamide BID started 9/11 to slow jejunostomy output down.  Increased loperamide from 2 mg to 4 twice daily 9/14.  Output 1 L yesterday, improved.  Keep at this level for now.  Most likely will need to increase loperamide if she attempts p.o. later this week.  may eventually need DTO drops or Paregoric.  We will see.    Most likely if she survives and gets discharged would benefit from IV fluid infusions as well as home TPN.  Monitor volume of ostomy output as she will likely need intermittent fluid replacement for this.    Patient rather hoarse but bright and alert.  Had no prior swallowing problems.  She is mentally clear asking complicated appropriate questions.  Speech therapy reevaluated and still concerned about swallowing aspiration risks.  Will  12/24/2022  [name] [id number] [birth year]  CARE TEAM: PCP: Ollen Bowl, MD  Outpatient Care Team: Patient Care Team: Ollen Bowl, MD as PCP - General (Internal Medicine) Maisie Fus, MD as PCP - Cardiology (Cardiology) Pershing Proud, RN as Oncology Nurse Navigator Donnelly Angelica, RN as Oncology Nurse Navigator Romie Levee, MD as Consulting Physician (Colon and Rectal Surgery) Iva Boop, MD as Consulting Physician (Gastroenterology) Emelia Loron, MD as Consulting Physician (General Surgery) Serena Croissant, MD as Consulting Physician (Hematology and Oncology)  Inpatient Treatment Team: Treatment Team:  Hunsucker, Lesia Sago, MD Pccm, Md, MD Ccs, Md, MD Bullins, Benard Halsted, RN Brien Few, PT Elmer Bales, RN Early, Derrel Nip, NT Duane Boston, RN   Problem List:   Principal Problem:   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 Active Problems:   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   Crohn's disease of both small and large intestine with intestinal obstruction (HCC)   SBO with strangulation s/p resection 12/14/2022   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   Malignant neoplasm of upper-outer quadrant of right breast in female, estrogen receptor positive (HCC)   Hypokalemia   Port-A-Cath in place   Breast cancer (HCC)   Chronic disease anemia   Protein-calorie malnutrition, severe (HCC)   Failure to thrive in adult   Anorexia   Cardiac arrest (HCC)   Septic shock (HCC)  12/14/2022  Pre-operative Diagnoses: intestional stricture Postoperative Diagnoses: internal hernia  Procedure:    EXPLORATORY LAPAROTOMY WITH SMALL BOWEL RESECTION  Surgeons and Role:    * Hillery Hunter Lucilla Edin, MD - Primary    * Romie Levee, MD - Assisting  Operative Findings: Internal hernia resulting in small bowel ischemia.  Approximately 105cm of small bowel remaining after the frankly necrotic portions were excised.      12/16/2022   Pre-operative Diagnoses: bowel ischemia Postoperative Diagnoses: bowel ischemia   Procedure performed: Re-opening laparotomy, ileostomy creation, abdominal wall closure   Surgeons and Role:    * Hillery Hunter Lucilla Edin, MD - Primary    * Griselda Miner, MD - Assisting   Operative Findings: Remaining small bowel appeared healthy.  No signs of ongoing abdominal contamination.  End ileostomy matured.      Assessment [facility] Stay = 11 days) 8 Days Post-Op    Stabilizing   Plan:  Short gut (105cm jejunum remains) with end jejunostomy status post massive bowel resection for closed-loop obstruction/necrosis  I am skeptical that she will have enough small bowel to absorb adequate oral nutrition on her own.  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  Can consider start cycling TPN  High output jejunostomy.  Iron/Fiber/loperamide BID started 9/11 to slow jejunostomy output down.  Increased loperamide from 2 mg to 4 twice daily 9/14.  Output 1 L yesterday, improved.  Keep at this level for now.  Most likely will need to increase loperamide if she attempts p.o. later this week.  may eventually need DTO drops or Paregoric.  We will see.    Most likely if she survives and gets discharged would benefit from IV fluid infusions as well as home TPN.  Monitor volume of ostomy output as she will likely need intermittent fluid replacement for this.    Patient rather hoarse but bright and alert.  Had no prior swallowing problems.  She is mentally clear asking complicated appropriate questions.  Speech therapy reevaluated and still concerned about swallowing aspiration risks.  Will  [id number]  Fibrinogen (coagulopathy lab panel)     Status: Abnormal   Collection Time: 12/24/22  4:08 AM  Result Value Ref Range   Fibrinogen 560 (H) 210 - 475 mg/dL    Comment: (NOTE) Fibrinogen results may be underestimated in patients receiving thrombolytic therapy. Performed at [facility], [address] [place], [place] [address]   Protime-INR (coagulopathy lab panel)     Status: Abnormal   Collection Time: 12/24/22  4:08 AM  Result Value Ref Range   Prothrombin Time 16.8 (H) 11.4 - 15.2 seconds   INR 1.3 (H) 0.8 - 1.2    Comment: (NOTE) INR goal varies based on device and disease states. Performed at [facility], [address] [place], [place] [address]   APTT (coagulopathy lab panel)     Status: None   Collection Time: 12/24/22  4:08 AM  Result Value Ref  Range   aPTT 36 24 - 36 seconds    Comment: Performed at [facility], [address], [place], [place] [address]    Imaging / Studies: DG Abd 1 View  Result Date: 12/23/2022 CLINICAL DATA:  NG tube placement. EXAM: ABDOMEN - 1 VIEW COMPARISON:  12/19/2022. FINDINGS: Enteric tube passes below the diaphragm, through the stomach into the right mid abdomen projecting the level of the duodenal bulb/pylorus. No bowel dilation. IMPRESSION: 1. Enteric feeding tube tip projects the level of the pylorus/duodenal bulb. Electronically Signed   By: Amie Portland M.D.   On: 12/23/2022 15:12   DG Swallowing Func-Speech Pathology  Result Date: 12/23/2022 Table formatting from the original result was not included. Modified Barium Swallow Study Patient Details Name: [name] MRN: [id number] Date of Birth: 09-06-1975 Today's Date: 12/23/2022 HPI/PMH: HPI: Per CCM note "47 year old female with past medical history of recurrent breast cancer not currently on treatment, Crohn's disease s/p subtotal colectomy in 2021 recurrent obstructions, colonic stricture, IDA, recent hospitalization 8/10-8/15/2024 for obstruction seen by GI and general surgery. Underwent EGD and flexible sigmoidoscopy and discharged on steroids who presented to the emergency department on 12/12/2022 with abdominal pain, nausea, vomiting diarrhea. In the ED tachycardic, hypertensive. ED provider notes abdominal distention. She was given 2L IVF, morphine, IV PPI. Labs with WBC 10.8, Hgb 10.7, K 3.2. CT AP was ordered and showed markedly dilated distal small bowel consistent with chronic obstruction. No changes since prior study. Patient was admitted to [facility]. They consulted GI for input. Unfortunately, around 0820 9/4 patient was found on the floor in the bathroom in ED pulseless. They estimate downtime around 5 minutes. ACLS was initiated. Concern for possible torsades de pointes and given IV mag, defibrillated. Coded for around 5  minutes. She was intubated. She had copious vomitus in the ETT. ROSC achieved and ICU was consulted. Notably, she had I-stat peri-code which showed hemoglobin of 5.5 down from 10 overnight."  Pt was extubated 12/18/2021. Clinical Impression: Clinical Impression: MBS was completed in the laterial projection using barium impregnated thin liquids via spoon, mildly thick liquid via spoon and cup and pureed material.  She presented with an oral and pharyngeal dysphagia.  Aspiration with a weak cough response was seen given thin liquids via spoon and mildly thick liquids via self fed cup sips.  Suggest patient remain NPO with comfort feeds of 1/2 tsp bites of pureed material and spoon sips of mildly thick liquids.  Oral care needs to be diligent and compeleted 2-3 t imes per day using a toothbrush and toothpaste.  [id number]  Fibrinogen (coagulopathy lab panel)     Status: Abnormal   Collection Time: 12/24/22  4:08 AM  Result Value Ref Range   Fibrinogen 560 (H) 210 - 475 mg/dL    Comment: (NOTE) Fibrinogen results may be underestimated in patients receiving thrombolytic therapy. Performed at [facility], [address] [place], [place] [address]   Protime-INR (coagulopathy lab panel)     Status: Abnormal   Collection Time: 12/24/22  4:08 AM  Result Value Ref Range   Prothrombin Time 16.8 (H) 11.4 - 15.2 seconds   INR 1.3 (H) 0.8 - 1.2    Comment: (NOTE) INR goal varies based on device and disease states. Performed at [facility], [address] [place], [place] [address]   APTT (coagulopathy lab panel)     Status: None   Collection Time: 12/24/22  4:08 AM  Result Value Ref  Range   aPTT 36 24 - 36 seconds    Comment: Performed at [facility], [address], [place], [place] [address]    Imaging / Studies: DG Abd 1 View  Result Date: 12/23/2022 CLINICAL DATA:  NG tube placement. EXAM: ABDOMEN - 1 VIEW COMPARISON:  12/19/2022. FINDINGS: Enteric tube passes below the diaphragm, through the stomach into the right mid abdomen projecting the level of the duodenal bulb/pylorus. No bowel dilation. IMPRESSION: 1. Enteric feeding tube tip projects the level of the pylorus/duodenal bulb. Electronically Signed   By: Amie Portland M.D.   On: 12/23/2022 15:12   DG Swallowing Func-Speech Pathology  Result Date: 12/23/2022 Table formatting from the original result was not included. Modified Barium Swallow Study Patient Details Name: [name] MRN: [id number] Date of Birth: 09-06-1975 Today's Date: 12/23/2022 HPI/PMH: HPI: Per CCM note "47 year old female with past medical history of recurrent breast cancer not currently on treatment, Crohn's disease s/p subtotal colectomy in 2021 recurrent obstructions, colonic stricture, IDA, recent hospitalization 8/10-8/15/2024 for obstruction seen by GI and general surgery. Underwent EGD and flexible sigmoidoscopy and discharged on steroids who presented to the emergency department on 12/12/2022 with abdominal pain, nausea, vomiting diarrhea. In the ED tachycardic, hypertensive. ED provider notes abdominal distention. She was given 2L IVF, morphine, IV PPI. Labs with WBC 10.8, Hgb 10.7, K 3.2. CT AP was ordered and showed markedly dilated distal small bowel consistent with chronic obstruction. No changes since prior study. Patient was admitted to [facility]. They consulted GI for input. Unfortunately, around 0820 9/4 patient was found on the floor in the bathroom in ED pulseless. They estimate downtime around 5 minutes. ACLS was initiated. Concern for possible torsades de pointes and given IV mag, defibrillated. Coded for around 5  minutes. She was intubated. She had copious vomitus in the ETT. ROSC achieved and ICU was consulted. Notably, she had I-stat peri-code which showed hemoglobin of 5.5 down from 10 overnight."  Pt was extubated 12/18/2021. Clinical Impression: Clinical Impression: MBS was completed in the laterial projection using barium impregnated thin liquids via spoon, mildly thick liquid via spoon and cup and pureed material.  She presented with an oral and pharyngeal dysphagia.  Aspiration with a weak cough response was seen given thin liquids via spoon and mildly thick liquids via self fed cup sips.  Suggest patient remain NPO with comfort feeds of 1/2 tsp bites of pureed material and spoon sips of mildly thick liquids.  Oral care needs to be diligent and compeleted 2-3 t imes per day using a toothbrush and toothpaste.  12/24/2022  [name] [id number] [birth year]  CARE TEAM: PCP: Ollen Bowl, MD  Outpatient Care Team: Patient Care Team: Ollen Bowl, MD as PCP - General (Internal Medicine) Maisie Fus, MD as PCP - Cardiology (Cardiology) Pershing Proud, RN as Oncology Nurse Navigator Donnelly Angelica, RN as Oncology Nurse Navigator Romie Levee, MD as Consulting Physician (Colon and Rectal Surgery) Iva Boop, MD as Consulting Physician (Gastroenterology) Emelia Loron, MD as Consulting Physician (General Surgery) Serena Croissant, MD as Consulting Physician (Hematology and Oncology)  Inpatient Treatment Team: Treatment Team:  Hunsucker, Lesia Sago, MD Pccm, Md, MD Ccs, Md, MD Bullins, Benard Halsted, RN Brien Few, PT Elmer Bales, RN Early, Derrel Nip, NT Duane Boston, RN   Problem List:   Principal Problem:   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 Active Problems:   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   Crohn's disease of both small and large intestine with intestinal obstruction (HCC)   SBO with strangulation s/p resection 12/14/2022   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   Malignant neoplasm of upper-outer quadrant of right breast in female, estrogen receptor positive (HCC)   Hypokalemia   Port-A-Cath in place   Breast cancer (HCC)   Chronic disease anemia   Protein-calorie malnutrition, severe (HCC)   Failure to thrive in adult   Anorexia   Cardiac arrest (HCC)   Septic shock (HCC)  12/14/2022  Pre-operative Diagnoses: intestional stricture Postoperative Diagnoses: internal hernia  Procedure:    EXPLORATORY LAPAROTOMY WITH SMALL BOWEL RESECTION  Surgeons and Role:    * Hillery Hunter Lucilla Edin, MD - Primary    * Romie Levee, MD - Assisting  Operative Findings: Internal hernia resulting in small bowel ischemia.  Approximately 105cm of small bowel remaining after the frankly necrotic portions were excised.      12/16/2022   Pre-operative Diagnoses: bowel ischemia Postoperative Diagnoses: bowel ischemia   Procedure performed: Re-opening laparotomy, ileostomy creation, abdominal wall closure   Surgeons and Role:    * Hillery Hunter Lucilla Edin, MD - Primary    * Griselda Miner, MD - Assisting   Operative Findings: Remaining small bowel appeared healthy.  No signs of ongoing abdominal contamination.  End ileostomy matured.      Assessment [facility] Stay = 11 days) 8 Days Post-Op    Stabilizing   Plan:  Short gut (105cm jejunum remains) with end jejunostomy status post massive bowel resection for closed-loop obstruction/necrosis  I am skeptical that she will have enough small bowel to absorb adequate oral nutrition on her own.  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  Can consider start cycling TPN  High output jejunostomy.  Iron/Fiber/loperamide BID started 9/11 to slow jejunostomy output down.  Increased loperamide from 2 mg to 4 twice daily 9/14.  Output 1 L yesterday, improved.  Keep at this level for now.  Most likely will need to increase loperamide if she attempts p.o. later this week.  may eventually need DTO drops or Paregoric.  We will see.    Most likely if she survives and gets discharged would benefit from IV fluid infusions as well as home TPN.  Monitor volume of ostomy output as she will likely need intermittent fluid replacement for this.    Patient rather hoarse but bright and alert.  Had no prior swallowing problems.  She is mentally clear asking complicated appropriate questions.  Speech therapy reevaluated and still concerned about swallowing aspiration risks.  Will  [id number]  Fibrinogen (coagulopathy lab panel)     Status: Abnormal   Collection Time: 12/24/22  4:08 AM  Result Value Ref Range   Fibrinogen 560 (H) 210 - 475 mg/dL    Comment: (NOTE) Fibrinogen results may be underestimated in patients receiving thrombolytic therapy. Performed at [facility], [address] [place], [place] [address]   Protime-INR (coagulopathy lab panel)     Status: Abnormal   Collection Time: 12/24/22  4:08 AM  Result Value Ref Range   Prothrombin Time 16.8 (H) 11.4 - 15.2 seconds   INR 1.3 (H) 0.8 - 1.2    Comment: (NOTE) INR goal varies based on device and disease states. Performed at [facility], [address] [place], [place] [address]   APTT (coagulopathy lab panel)     Status: None   Collection Time: 12/24/22  4:08 AM  Result Value Ref  Range   aPTT 36 24 - 36 seconds    Comment: Performed at [facility], [address], [place], [place] [address]    Imaging / Studies: DG Abd 1 View  Result Date: 12/23/2022 CLINICAL DATA:  NG tube placement. EXAM: ABDOMEN - 1 VIEW COMPARISON:  12/19/2022. FINDINGS: Enteric tube passes below the diaphragm, through the stomach into the right mid abdomen projecting the level of the duodenal bulb/pylorus. No bowel dilation. IMPRESSION: 1. Enteric feeding tube tip projects the level of the pylorus/duodenal bulb. Electronically Signed   By: Amie Portland M.D.   On: 12/23/2022 15:12   DG Swallowing Func-Speech Pathology  Result Date: 12/23/2022 Table formatting from the original result was not included. Modified Barium Swallow Study Patient Details Name: [name] MRN: [id number] Date of Birth: 09-06-1975 Today's Date: 12/23/2022 HPI/PMH: HPI: Per CCM note "47 year old female with past medical history of recurrent breast cancer not currently on treatment, Crohn's disease s/p subtotal colectomy in 2021 recurrent obstructions, colonic stricture, IDA, recent hospitalization 8/10-8/15/2024 for obstruction seen by GI and general surgery. Underwent EGD and flexible sigmoidoscopy and discharged on steroids who presented to the emergency department on 12/12/2022 with abdominal pain, nausea, vomiting diarrhea. In the ED tachycardic, hypertensive. ED provider notes abdominal distention. She was given 2L IVF, morphine, IV PPI. Labs with WBC 10.8, Hgb 10.7, K 3.2. CT AP was ordered and showed markedly dilated distal small bowel consistent with chronic obstruction. No changes since prior study. Patient was admitted to [facility]. They consulted GI for input. Unfortunately, around 0820 9/4 patient was found on the floor in the bathroom in ED pulseless. They estimate downtime around 5 minutes. ACLS was initiated. Concern for possible torsades de pointes and given IV mag, defibrillated. Coded for around 5  minutes. She was intubated. She had copious vomitus in the ETT. ROSC achieved and ICU was consulted. Notably, she had I-stat peri-code which showed hemoglobin of 5.5 down from 10 overnight."  Pt was extubated 12/18/2021. Clinical Impression: Clinical Impression: MBS was completed in the laterial projection using barium impregnated thin liquids via spoon, mildly thick liquid via spoon and cup and pureed material.  She presented with an oral and pharyngeal dysphagia.  Aspiration with a weak cough response was seen given thin liquids via spoon and mildly thick liquids via self fed cup sips.  Suggest patient remain NPO with comfort feeds of 1/2 tsp bites of pureed material and spoon sips of mildly thick liquids.  Oral care needs to be diligent and compeleted 2-3 t imes per day using a toothbrush and toothpaste.  12/24/2022  [name] [id number] [birth year]  CARE TEAM: PCP: Ollen Bowl, MD  Outpatient Care Team: Patient Care Team: Ollen Bowl, MD as PCP - General (Internal Medicine) Maisie Fus, MD as PCP - Cardiology (Cardiology) Pershing Proud, RN as Oncology Nurse Navigator Donnelly Angelica, RN as Oncology Nurse Navigator Romie Levee, MD as Consulting Physician (Colon and Rectal Surgery) Iva Boop, MD as Consulting Physician (Gastroenterology) Emelia Loron, MD as Consulting Physician (General Surgery) Serena Croissant, MD as Consulting Physician (Hematology and Oncology)  Inpatient Treatment Team: Treatment Team:  Hunsucker, Lesia Sago, MD Pccm, Md, MD Ccs, Md, MD Bullins, Benard Halsted, RN Brien Few, PT Elmer Bales, RN Early, Derrel Nip, NT Duane Boston, RN   Problem List:   Principal Problem:   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 Active Problems:   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   Crohn's disease of both small and large intestine with intestinal obstruction (HCC)   SBO with strangulation s/p resection 12/14/2022   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   Malignant neoplasm of upper-outer quadrant of right breast in female, estrogen receptor positive (HCC)   Hypokalemia   Port-A-Cath in place   Breast cancer (HCC)   Chronic disease anemia   Protein-calorie malnutrition, severe (HCC)   Failure to thrive in adult   Anorexia   Cardiac arrest (HCC)   Septic shock (HCC)  12/14/2022  Pre-operative Diagnoses: intestional stricture Postoperative Diagnoses: internal hernia  Procedure:    EXPLORATORY LAPAROTOMY WITH SMALL BOWEL RESECTION  Surgeons and Role:    * Hillery Hunter Lucilla Edin, MD - Primary    * Romie Levee, MD - Assisting  Operative Findings: Internal hernia resulting in small bowel ischemia.  Approximately 105cm of small bowel remaining after the frankly necrotic portions were excised.      12/16/2022   Pre-operative Diagnoses: bowel ischemia Postoperative Diagnoses: bowel ischemia   Procedure performed: Re-opening laparotomy, ileostomy creation, abdominal wall closure   Surgeons and Role:    * Hillery Hunter Lucilla Edin, MD - Primary    * Griselda Miner, MD - Assisting   Operative Findings: Remaining small bowel appeared healthy.  No signs of ongoing abdominal contamination.  End ileostomy matured.      Assessment [facility] Stay = 11 days) 8 Days Post-Op    Stabilizing   Plan:  Short gut (105cm jejunum remains) with end jejunostomy status post massive bowel resection for closed-loop obstruction/necrosis  I am skeptical that she will have enough small bowel to absorb adequate oral nutrition on her own.  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  Can consider start cycling TPN  High output jejunostomy.  Iron/Fiber/loperamide BID started 9/11 to slow jejunostomy output down.  Increased loperamide from 2 mg to 4 twice daily 9/14.  Output 1 L yesterday, improved.  Keep at this level for now.  Most likely will need to increase loperamide if she attempts p.o. later this week.  may eventually need DTO drops or Paregoric.  We will see.    Most likely if she survives and gets discharged would benefit from IV fluid infusions as well as home TPN.  Monitor volume of ostomy output as she will likely need intermittent fluid replacement for this.    Patient rather hoarse but bright and alert.  Had no prior swallowing problems.  She is mentally clear asking complicated appropriate questions.  Speech therapy reevaluated and still concerned about swallowing aspiration risks.  Will  12/24/2022  [name] [id number] [birth year]  CARE TEAM: PCP: Ollen Bowl, MD  Outpatient Care Team: Patient Care Team: Ollen Bowl, MD as PCP - General (Internal Medicine) Maisie Fus, MD as PCP - Cardiology (Cardiology) Pershing Proud, RN as Oncology Nurse Navigator Donnelly Angelica, RN as Oncology Nurse Navigator Romie Levee, MD as Consulting Physician (Colon and Rectal Surgery) Iva Boop, MD as Consulting Physician (Gastroenterology) Emelia Loron, MD as Consulting Physician (General Surgery) Serena Croissant, MD as Consulting Physician (Hematology and Oncology)  Inpatient Treatment Team: Treatment Team:  Hunsucker, Lesia Sago, MD Pccm, Md, MD Ccs, Md, MD Bullins, Benard Halsted, RN Brien Few, PT Elmer Bales, RN Early, Derrel Nip, NT Duane Boston, RN   Problem List:   Principal Problem:   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 Active Problems:   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   Crohn's disease of both small and large intestine with intestinal obstruction (HCC)   SBO with strangulation s/p resection 12/14/2022   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   Malignant neoplasm of upper-outer quadrant of right breast in female, estrogen receptor positive (HCC)   Hypokalemia   Port-A-Cath in place   Breast cancer (HCC)   Chronic disease anemia   Protein-calorie malnutrition, severe (HCC)   Failure to thrive in adult   Anorexia   Cardiac arrest (HCC)   Septic shock (HCC)  12/14/2022  Pre-operative Diagnoses: intestional stricture Postoperative Diagnoses: internal hernia  Procedure:    EXPLORATORY LAPAROTOMY WITH SMALL BOWEL RESECTION  Surgeons and Role:    * Hillery Hunter Lucilla Edin, MD - Primary    * Romie Levee, MD - Assisting  Operative Findings: Internal hernia resulting in small bowel ischemia.  Approximately 105cm of small bowel remaining after the frankly necrotic portions were excised.      12/16/2022   Pre-operative Diagnoses: bowel ischemia Postoperative Diagnoses: bowel ischemia   Procedure performed: Re-opening laparotomy, ileostomy creation, abdominal wall closure   Surgeons and Role:    * Hillery Hunter Lucilla Edin, MD - Primary    * Griselda Miner, MD - Assisting   Operative Findings: Remaining small bowel appeared healthy.  No signs of ongoing abdominal contamination.  End ileostomy matured.      Assessment [facility] Stay = 11 days) 8 Days Post-Op    Stabilizing   Plan:  Short gut (105cm jejunum remains) with end jejunostomy status post massive bowel resection for closed-loop obstruction/necrosis  I am skeptical that she will have enough small bowel to absorb adequate oral nutrition on her own.  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  Can consider start cycling TPN  High output jejunostomy.  Iron/Fiber/loperamide BID started 9/11 to slow jejunostomy output down.  Increased loperamide from 2 mg to 4 twice daily 9/14.  Output 1 L yesterday, improved.  Keep at this level for now.  Most likely will need to increase loperamide if she attempts p.o. later this week.  may eventually need DTO drops or Paregoric.  We will see.    Most likely if she survives and gets discharged would benefit from IV fluid infusions as well as home TPN.  Monitor volume of ostomy output as she will likely need intermittent fluid replacement for this.    Patient rather hoarse but bright and alert.  Had no prior swallowing problems.  She is mentally clear asking complicated appropriate questions.  Speech therapy reevaluated and still concerned about swallowing aspiration risks.  Will

## 2022-12-24 NOTE — Progress Notes (Addendum)
PHARMACY - TOTAL PARENTERAL NUTRITION CONSULT NOTE   Indication: Short bowel syndrome  Patient Measurements: Height: 5\' 4"  (162.6 cm) Weight: 64.7 kg (142 lb 10.2 oz) IBW/kg (Calculated) : 54.7   Body mass index is 24.48 kg/m. Usual Weight: 46.3 kg from 8/28  Assessment: 47 y.o. female with PMH of recurrent breast cancer not currently on treatment, Crohn's disease s/p subtotal colectomy in 2021, recurrent obstructions, colonic stricture, recent hospitalization 8/10-8/15/2024 for obstruction who presented on 12/12/2022 with abdominal pain, nausea, vomiting diarrhea.    9/4 Admit; found down in ED bathroom with no pulse, CPR > intubated 9/5 s/p ex-lap, found to have internal hernia with small bowel ischemia, only 105 cm of small bowel remaining OR 9/7 for closure, ostomy creation 9/8 pharmacy consulted to manage TPN 9/15 Surgery requesting transition to cyclic TPN  Glucose / Insulin: no Hx DM - steroids PTA for Crohn's disease; has now been weaned off as of 9/13 - CBGs at goal 100-150; no lows - SSI discontinued 9/14 Electrolytes: all stable WNL Renal: AKI after arrest; SCr had been improving (baseline < 1.0) but now stalled out around 1.5; BUN elevated and stable as well; UOP remains robust without diuretics Hepatic (9/14): LFTs/Bili normalized after post-arrest insult - albumin low on admission and remains < 1.5 - TG (9/11) normalized after stopping propofol - lipase bump noted on 9/10; no prior levels since arrest, but seems less likely related to TPN given TG consistently WNL I/O: Net +19L this admission - likely overestimate from incomplete charting & insensible losses); weight only up 4kg from admission weight - more likely reflects accurate fluid status - weight up 4 kg overnight - likely d/t heavy abdominal bindings/ice packs to stop continued bleeding - Ileostomy OP significantly lower yesterday after stopping TFs; getting fiber, iron, loperamide per tube to slow transit time -  NG replaced 9/14 after swallow eval - no output - mIVF: none GI Imaging:  - CTA 9/10 shows new PE, but also abdominal ascites GI Surgeries / Procedures:  - OR 9/5 ex lap, internal hernia w/ small bowel ischemia, SBR & celiotomy; > 105 cm small bowel remaining; - OR 9/7 for closure, ostomy creation Central access: triple lumen CVC R subclavian 9/4, triple lumen CVC R femoral 9/4;  PAC 05/02/22 TPN start date: 12/17/22  Nutritional Goals: - Current TPN formulation at 70 mL/hr provides 95 g protein and 1737 kcal per day  RD Assessment: Estimated Needs Total Energy Estimated Needs: 1600-1800 Total Protein Estimated Needs: 85-95 grams Total Fluid Estimated Needs: 1.6L/day  Current Nutrition:  NPO   Plan: Begin cycling TPN with 18-hr cycle starting at 18:00 tonight Anticipate prolonged/indefinite TPN dependency Max glucose infusion rate 3.83 mg/kg/min on 18-hr cycle (will be 5.91 on 12-hr cycle) Electrolytes in TPN: no changes Na - 50 mEq/L K - 60 mEq/L Ca - 5 mEq/L Mg - 10 mEq/L Phos - 15 mmol/L Cl:Ac ratio - 1:1 Add standard MVI and trace elements to TPN Chromium on hold d/t critical shortage Resume CBG checks 4x daily while cycling TPN On TPN Off TPN 1-2 hr after completing ramp-up 1 hr after TPN stopped MIVF per primary Monitor TPN labs on Mon/Thurs   Bernadene Person, PharmD, BCPS [phone] 12/24/2022, 7:52 AM

## 2022-12-24 NOTE — Progress Notes (Incomplete)
Called the on-call surgeon Dr. Romie Levee about the patient bleeding through her abd dressing. I was instructed to removed the dressing and hold pressure with my finger. I expressed to Dr. Maisie Fus that I was instructed by Dr. Michaell Cowing to NOT REMOVE the dressing. Dressing

## 2022-12-24 NOTE — Plan of Care (Signed)
  Problem: Education: Goal: Ability to describe self-care measures that may prevent or decrease complications (Diabetes Survival Skills Education) will improve Outcome: Progressing Goal: Individualized Educational Video(s) Outcome: Progressing   Problem: Coping: Goal: Ability to adjust to condition or change in health will improve Outcome: Progressing   Problem: Fluid Volume: Goal: Ability to maintain a balanced intake and output will improve Outcome: Progressing   Problem: Health Behavior/Discharge Planning: Goal: Ability to identify and utilize available resources and services will improve Outcome: Progressing Goal: Ability to manage health-related needs will improve Outcome: Progressing   Problem: Metabolic: Goal: Ability to maintain appropriate glucose levels will improve Outcome: Progressing   Problem: Nutritional: Goal: Maintenance of adequate nutrition will improve Outcome: Progressing   Problem: Tissue Perfusion: Goal: Adequacy of tissue perfusion will improve Outcome: Progressing   Problem: Education: Goal: Knowledge of General Education information will improve Description: Including pain rating scale, medication(s)/side effects and non-pharmacologic comfort measures Outcome: Progressing   Problem: Clinical Measurements: Goal: Ability to maintain clinical measurements within normal limits will improve Outcome: Progressing Goal: Diagnostic test results will improve Outcome: Progressing Goal: Respiratory complications will improve Outcome: Progressing   Problem: Nutrition: Goal: Adequate nutrition will be maintained Outcome: Progressing   Problem: Coping: Goal: Level of anxiety will decrease Outcome: Progressing   Problem: Elimination: Goal: Will not experience complications related to bowel motility Outcome: Progressing Goal: Will not experience complications related to urinary retention Outcome: Progressing   Problem: Pain Managment: Goal: General  experience of comfort will improve Outcome: Progressing   Problem: Safety: Goal: Ability to remain free from injury will improve Outcome: Progressing

## 2022-12-24 NOTE — Telephone Encounter (Signed)
Patient still in hospital with critical illness and is s/p bowel resection for infarcted bowel.  We will not be continuing Entyvio  Please cancel all future Entyvio infusions  She has one 9/16 at 915

## 2022-12-24 NOTE — Progress Notes (Signed)
eLink Physician-Brief Progress Note Patient Name: [name] DOB: 29-Jul-1975 MRN: [id number]   Date of Service  12/24/2022  HPI/Events of Note  Notified by RN that patient has steady trickle from the abdominal midline incision.    Reviewed the note from Dr Michaell Cowing regarding the same issue.   eICU Interventions  RN to apply extra reinforcement with a binder, apply weights as well as ice.  Get CBC now and will transfuse to keep hgb >7.     Intervention Category Intermediate Interventions: Bleeding - evaluation and treatment with blood products  Larinda Buttery 12/24/2022, 11:50 PM  1:48 AM Notified of anemia with hgb down to 6.7.   Plan> Transfuse 1 unit pRBC.   2:41 AM Notified of borderline blood pressure at 87/63, MAP 70.  Still awaiting type and screen and blood confirmation.   Plan> Give 500cc LR bolus.  Transfuse 1 unit pRBC when possible.   4:07 AM Notified of temp at 101F despite scheduled tylenol.  Pt is on zosyn.  Vancomycin stopped on 12/22/22.  Plan> Extra dose of tylenol 650mg  ordered for now so as not to exceed 4g total tylenol in a day.   4:58 AM Pt now refusing ice and sandbags on her abdomen as she feels they are too much for her.  RN applied dressing pads and tightened abdominal binder.   Bp 94/53 (MAP 66) after IVF bolus. RN still awaiting blood.   Plan> Continue with abdominal binder.  Surgery needs to reevaluate this morning.  Give another 500cc LR bolus now.  Transfuse as soon as blood arrives.

## 2022-12-24 NOTE — Progress Notes (Addendum)
Called the on-call surgeon Dr. Romie Levee about the patient bleeding through her abd dressing. I was instructed to removed the dressing and hold pressure with my finger. I expressed to Dr. Maisie Fus that I was instructed by Dr. Michaell Cowing to NOT REMOVE the dressing. I did not removed the dressing because is felt as thought it was unsafe and out of my scope of practice. Instead I did reinforce it using a new abd pad and I informed Elink provider and my charge nurse that I would not do. I applied further pressure using 5lb sand bag, ice pack and an abdominal binder

## 2022-12-24 NOTE — Progress Notes (Signed)
0100 site assessed, abd pads saturated. New pads placed. 15lbs reapplied 0200 pt given 25 mcg of fentanyl, abd pad changed, 5lbs applied, abdominal binder applied per providers orders 0300 no new signs of bleeding 0400 5lb removed, no new sings of bleeding 1 folded towel placed between abd pad and abdominal binder, pt tolerated well

## 2022-12-25 ENCOUNTER — Inpatient Hospital Stay (HOSPITAL_COMMUNITY): Payer: Managed Care, Other (non HMO)

## 2022-12-25 ENCOUNTER — Ambulatory Visit: Payer: Managed Care, Other (non HMO)

## 2022-12-25 DIAGNOSIS — D62 Acute posthemorrhagic anemia: Secondary | ICD-10-CM | POA: Diagnosis not present

## 2022-12-25 DIAGNOSIS — E43 Unspecified severe protein-calorie malnutrition: Secondary | ICD-10-CM

## 2022-12-25 DIAGNOSIS — I2699 Other pulmonary embolism without acute cor pulmonale: Secondary | ICD-10-CM | POA: Diagnosis not present

## 2022-12-25 DIAGNOSIS — R739 Hyperglycemia, unspecified: Secondary | ICD-10-CM

## 2022-12-25 DIAGNOSIS — R000 Tachycardia, unspecified: Secondary | ICD-10-CM | POA: Diagnosis not present

## 2022-12-25 LAB — BPAM RBC
Blood Product Expiration Date: 202410062359
Blood Product Expiration Date: 202410072359
Blood Product Expiration Date: 202410092359
ISSUE DATE / TIME: 202409120658
ISSUE DATE / TIME: 202409140905
ISSUE DATE / TIME: 202409141138
Unit Type and Rh: 7300
Unit Type and Rh: 7300
Unit Type and Rh: 7300

## 2022-12-25 LAB — COMPREHENSIVE METABOLIC PANEL
ALT: 12 U/L (ref 0–44)
AST: 16 U/L (ref 15–41)
Albumin: <1.5 g/dL — ABNORMAL LOW (ref 3.5–5.0)
Alkaline Phosphatase: 69 U/L (ref 38–126)
Anion gap: 9 (ref 5–15)
BUN: 51 mg/dL — ABNORMAL HIGH (ref 6–20)
CO2: 22 mmol/L (ref 22–32)
Calcium: 7.6 mg/dL — ABNORMAL LOW (ref 8.9–10.3)
Chloride: 107 mmol/L (ref 98–111)
Creatinine, Ser: 1.41 mg/dL — ABNORMAL HIGH (ref 0.44–1.00)
GFR, Estimated: 47 mL/min — ABNORMAL LOW (ref >60)
Glucose, Bld: 136 mg/dL — ABNORMAL HIGH (ref 70–99)
Potassium: 4.5 mmol/L (ref 3.5–5.1)
Sodium: 138 mmol/L (ref 135–145)
Total Bilirubin: 1 mg/dL (ref 0.3–1.2)
Total Protein: 3.8 g/dL — ABNORMAL LOW (ref 6.5–8.1)

## 2022-12-25 LAB — TYPE AND SCREEN
ABO/RH(D): B POS
Antibody Screen: NEGATIVE
Unit division: 0
Unit division: 0
Unit division: 0

## 2022-12-25 LAB — CBC
HCT: 20.6 % — ABNORMAL LOW (ref 36.0–46.0)
HCT: 21 % — ABNORMAL LOW (ref 36.0–46.0)
Hemoglobin: 6.7 g/dL — CL (ref 12.0–15.0)
Hemoglobin: 7.1 g/dL — ABNORMAL LOW (ref 12.0–15.0)
MCH: 29.6 pg (ref 26.0–34.0)
MCH: 30.6 pg (ref 26.0–34.0)
MCHC: 32.5 g/dL (ref 30.0–36.0)
MCHC: 33.8 g/dL (ref 30.0–36.0)
MCV: 91.2 fL (ref 80.0–100.0)
MCV: 90.5 fL (ref 80.0–100.0)
Platelets: 176 10*3/uL (ref 150–400)
Platelets: 178 10*3/uL (ref 150–400)
RBC: 2.26 MIL/uL — ABNORMAL LOW (ref 3.87–5.11)
RBC: 2.32 MIL/uL — ABNORMAL LOW (ref 3.87–5.11)
RDW: 16.6 % — ABNORMAL HIGH (ref 11.5–15.5)
RDW: 15.5 % (ref 11.5–15.5)
WBC: 23.6 10*3/uL — ABNORMAL HIGH (ref 4.0–10.5)
WBC: 25.7 10*3/uL — ABNORMAL HIGH (ref 4.0–10.5)
nRBC: 0 % (ref 0.0–0.2)
nRBC: 0 % (ref 0.0–0.2)

## 2022-12-25 LAB — GLUCOSE, CAPILLARY
Glucose-Capillary: 121 mg/dL — ABNORMAL HIGH (ref 70–99)
Glucose-Capillary: 123 mg/dL — ABNORMAL HIGH (ref 70–99)
Glucose-Capillary: 106 mg/dL — ABNORMAL HIGH (ref 70–99)
Glucose-Capillary: 78 mg/dL (ref 70–99)
Glucose-Capillary: 110 mg/dL — ABNORMAL HIGH (ref 70–99)

## 2022-12-25 LAB — PREPARE RBC (CROSSMATCH)

## 2022-12-25 LAB — PHOSPHORUS: Phosphorus: 3.7 mg/dL (ref 2.5–4.6)

## 2022-12-25 LAB — MAGNESIUM: Magnesium: 1.9 mg/dL (ref 1.7–2.4)

## 2022-12-25 MED ORDER — IOHEXOL 9 MG/ML PO SOLN
500.0000 mL | ORAL | Status: AC
  Administered 2022-12-25 (×2): 500 mL via ORAL

## 2022-12-25 MED ORDER — ACETAMINOPHEN 160 MG/5ML PO SOLN
650.0000 mg | Freq: Once | ORAL | Status: AC
  Administered 2022-12-25: 650 mg
  Filled 2022-12-25: qty 20.3

## 2022-12-25 MED ORDER — TRAVASOL 10 % IV SOLN
INTRAVENOUS | Status: AC
  Filled 2022-12-25: qty 940.8

## 2022-12-25 MED ORDER — SODIUM CHLORIDE 0.9% IV SOLUTION: Freq: Once | INTRAVENOUS | Status: DC

## 2022-12-25 MED ORDER — LACTATED RINGERS IV BOLUS
500.0000 mL | Freq: Once | INTRAVENOUS | Status: AC
  Administered 2022-12-25: 500 mL via INTRAVENOUS

## 2022-12-25 NOTE — Progress Notes (Addendum)
9 Days Post-Op  Subjective: CC: Seen with RN and my colleague. Patient saturating midline wound dressings with blood overnight. Reports pressure was applied using 5lb sand bag and an abdominal binder. Tachycardic in the 130's with last BP 86/49. Hgb 6.7. Getting 1U PRBC. Not on pressors. Tmax 101.3 over the last 24 hours (9/16 at 0500). On scheduled tylenol.  Patient reports pain in her lower mid abdomen and RLQ that is improved from the weekend. Currently NPO per SLP and TF's held. No n/v. Having ostomy output  RN updating ostomy output and UOP over the last 12 hours.   Objective: Vital signs in last 24 hours: Temp:  [98.4 F (36.9 C)-101.3 F (38.5 C)] 100 F (37.8 C) (09/16 0700) Pulse Rate:  [96-146] 130 (09/16 0700) Resp:  [16-39] 18 (09/16 0700) BP: (76-160)/(33-98) 86/49 (09/16 0700) SpO2:  [97 %-100 %] 100 % (09/16 0700) Weight:  [68.3 kg] 68.3 kg (09/16 0500) Last BM Date : 12/23/22  Intake/Output from previous day: 09/15 0701 - 09/16 0700 In: 4197.5 [I.V.:1994.3; NG/GT:330; IV Piggyback:1873.2] Out: 2150 [Urine:1600; Stool:550] Intake/Output this shift: No intake/output data recorded.  PE: Gen:  Alert, pleasant HEENT: EOM's intact, pupils equal and round Card:  Tachycardic Pulm: Rate and effort normal Abd: Soft, mild distension, NT on exam but reports she recently received pain medication. No rigidity or guarding. Ostomy pink, budded and viable with liquid stool in ostomy bag. Midline wound dressing saturated with blood and clot. Midline wound with small amount of oozing on superior and mid wound edges with removal of dressings that seems controlled after direct pressure. The inferior of the portion is with clots without active bleeding - there is an area on the most inferior aspect that was initially concerning (see picture 1) - my attending came and saw with me - fascia appears intact and there are no signs of active bleeding at this time (see picture  2)       Lab Results:  Recent Labs    12/24/22 0408 12/25/22 0012  WBC 27.4* 23.6*  HGB 8.6* 6.7*  HCT 25.4* 20.6*  PLT 157 176   BMET Recent Labs    12/24/22 0408 12/25/22 0334  NA 139 138  K 4.3 4.5  CL 107 107  CO2 24 22  GLUCOSE 121* 136*  BUN 52* 51*  CREATININE 1.48* 1.41*  CALCIUM 7.9* 7.6*   PT/INR Recent Labs    12/23/22 0520 12/24/22 0408  LABPROT 16.8* 16.8*  INR 1.3* 1.3*   CMP     Component Value Date/Time   NA 138 12/25/2022 0334   NA 135 01/21/2020 1056   K 4.5 12/25/2022 0334   CL 107 12/25/2022 0334   CO2 22 12/25/2022 0334   GLUCOSE 136 (H) 12/25/2022 0334   BUN 51 (H) 12/25/2022 0334   BUN 12 01/21/2020 1056   CREATININE 1.41 (H) 12/25/2022 0334   CREATININE 0.91 09/21/2022 1141   CALCIUM 7.6 (L) 12/25/2022 0334   PROT 3.8 (L) 12/25/2022 0334   ALBUMIN <1.5 (L) 12/25/2022 0334   AST 16 12/25/2022 0334   AST 18 09/21/2022 1141   ALT 12 12/25/2022 0334   ALT 12 09/21/2022 1141   ALKPHOS 69 12/25/2022 0334   BILITOT 1.0 12/25/2022 0334   BILITOT 0.4 09/21/2022 1141   GFRNONAA 47 (L) 12/25/2022 0334   GFRNONAA >60 09/21/2022 1141   GFRAA 108 01/21/2020 1056   Lipase     Component Value Date/Time   LIPASE 762 (H) 12/21/2022  6213    Studies/Results: DG Abd 1 View  Result Date: 12/23/2022 CLINICAL DATA:  NG tube placement. EXAM: ABDOMEN - 1 VIEW COMPARISON:  12/19/2022. FINDINGS: Enteric tube passes below the diaphragm, through the stomach into the right mid abdomen projecting the level of the duodenal bulb/pylorus. No bowel dilation. IMPRESSION: 1. Enteric feeding tube tip projects the level of the pylorus/duodenal bulb. Electronically Signed   By: Amie Portland M.D.   On: 12/23/2022 15:12   DG Swallowing Func-Speech Pathology  Result Date: 12/23/2022 Table formatting from the original result was not included. Modified Barium Swallow Study Patient Details Name: MITZEL MIRANTE MRN: [id number] Date of Birth: June 18, 1975 Today's  Date: 12/23/2022 HPI/PMH: HPI: Per CCM note "47 year old female with past medical history of recurrent breast cancer not currently on treatment, Crohn's disease s/p subtotal colectomy in 2021 recurrent obstructions, colonic stricture, IDA, recent hospitalization 8/10-8/15/2024 for obstruction seen by GI and general surgery. Underwent EGD and flexible sigmoidoscopy and discharged on steroids who presented to the emergency department on 12/12/2022 with abdominal pain, nausea, vomiting diarrhea. In the ED tachycardic, hypertensive. ED provider notes abdominal distention. She was given 2L IVF, morphine, IV PPI. Labs with WBC 10.8, Hgb 10.7, K 3.2. CT AP was ordered and showed markedly dilated distal small bowel consistent with chronic obstruction. No changes since prior study. Patient was admitted to [facility]. They consulted GI for input. Unfortunately, around 0820 9/4 patient was found on the floor in the bathroom in ED pulseless. They estimate downtime around 5 minutes. ACLS was initiated. Concern for possible torsades de pointes and given IV mag, defibrillated. Coded for around 5 minutes. She was intubated. She had copious vomitus in the ETT. ROSC achieved and ICU was consulted. Notably, she had I-stat peri-code which showed hemoglobin of 5.5 down from 10 overnight."  Pt was extubated 12/18/2021. Clinical Impression: Clinical Impression: MBS was completed in the laterial projection using barium impregnated thin liquids via spoon, mildly thick liquid via spoon and cup and pureed material.  She presented with an oral and pharyngeal dysphagia.  Aspiration with a weak cough response was seen given thin liquids via spoon and mildly thick liquids via self fed cup sips.  Suggest patient remain NPO with comfort feeds of 1/2 tsp bites of pureed material and spoon sips of mildly thick liquids.  Oral care needs to be diligent and compeleted 2-3 t imes per day using a toothbrush and toothpaste.  ST will follow for swallowing therapy.   Please see below for more information regarding swallowing physiology.  ORAL PHASE  1.  Poor lingual control during directed bolus hold.  She was noted to lose material into the buccal area and/or under the tongue prior to the direction to swallow.  This lack of control led to premature loss of the bolus with thin and mildly thick liquids resonating in the pyriform sinuses and pureed material resonating in the vallecular prior to the swallow trigger.  2.  Repetitive/disorganized lingual motion was seen leading to a delay in A/P transport.    PHARYNGEAL PHASE  1.  Significantly reduced anterior hyoid excursion and laryngeal elevation leading to decreased laryngeal vestibule closure.  2.  Gross aspiration of thin liquids via spoon and nectar/mildly thick liquid via self fed cup sip were seen with a weak cough response that was not effective to clear material from the laryngeal vestibule. Factors that may increase risk of adverse event in presence of aspiration Rubye Oaks & [name] 2021): Factors that may increase risk of adverse  event in presence of aspiration Rubye Oaks & [name] 2021): Poor general health and/or compromised immunity; Respiratory or GI disease; Limited mobility; Weak cough; Frequent aspiration of large volumes Recommendations/Plan: Swallowing Evaluation Recommendations Swallowing Evaluation Recommendations Recommendations: NPO (alternative means of nutrition; allowing for some comfort feeds of pureed material 1/2 tsp bites and mildly thick liquid via spoon sips.) Liquid Administration via: Spoon Medication Administration: Via alternative means Swallowing strategies  : Minimize environmental distractions; Slow rate; Small bites/sips Postural changes: Position pt fully upright for meals; Stay upright 30-60 min after meals Oral care recommendations: Oral care QID (4x/day) Recommended consults: Consider ENT consultation Caregiver Recommendations: Avoid jello, ice cream, thin soups, popsicles; Have oral suction  available Treatment Plan Treatment Plan Treatment recommendations: Therapy as outlined in treatment plan below Follow-up recommendations: -- (Ongoing ST at next level of care) Functional status assessment: Patient has had a recent decline in their functional status and demonstrates the ability to make significant improvements in function in a reasonable and predictable amount of time. Treatment frequency: Min 2x/week Treatment duration: 4 weeks Recommendations Recommendations for follow up therapy are one component of a multi-disciplinary discharge planning process, led by the attending physician.  Recommendations may be updated based on patient status, additional functional criteria and insurance authorization. Assessment: Orofacial Exam: Orofacial Exam Oral Cavity - Dentition: Adequate natural dentition Anatomy: No data recorded Boluses Administered: Boluses Administered Boluses Administered: Thin liquids (Level 0); Mildly thick liquids (Level 2, nectar thick); Puree  Oral Impairment Domain: Oral Impairment Domain Lip Closure: No labial escape Tongue control during bolus hold: Escape to lateral buccal cavity/floor of mouth Bolus preparation/mastication: -- (Not assessed.) Bolus transport/lingual motion: Repetitive/disorganized tongue motion Oral residue: Complete oral clearance Initiation of pharyngeal swallow : Valleculae; Pyriform sinuses  Pharyngeal Impairment Domain: Pharyngeal Impairment Domain Soft palate elevation: No bolus between soft palate (SP)/pharyngeal wall (PW) Laryngeal elevation: Partial superior movement of thyroid cartilage/partial approximation of arytenoids to epiglottic petiole; Minimal superior movement of thyroid cartilage with minimal approximation of arytenoids to epiglottic petiole Anterior hyoid excursion: Partial anterior movement; No anterior movement Epiglottic movement: Complete inversion Laryngeal vestibule closure: Incomplete, narrow column air/contrast in laryngeal vestibule  Pharyngeal stripping wave : Present - complete Pharyngeal contraction (A/P view only): N/A Pharyngoesophageal segment opening: Complete distension and complete duration, no obstruction of flow Tongue base retraction: Narrow column of contrast or air between tongue base and PPW Pharyngeal residue: Collection of residue within or on pharyngeal structures Location of pharyngeal residue: Valleculae  Esophageal Impairment Domain: Esophageal Impairment Domain Esophageal clearance upright position: -- (Not assessed.) Pill: No data recorded Penetration/Aspiration Scale Score: Penetration/Aspiration Scale Score 1.  Material does not enter airway: Puree; Mildly thick liquids (Level 2, nectar thick) (Mildly thick was via spoon sip) 7.  Material enters airway, passes BELOW cords and not ejected out despite cough attempt by patient: Thin liquids (Level 0); Mildly thick liquids (Level 2, nectar thick) (Mildly thick was via cup) Compensatory Strategies: Compensatory Strategies Compensatory strategies: No   General Information: No data recorded Diet Prior to this Study: Large bore NG tube   No data recorded  No data recorded  No data recorded  No data recorded Behavior/Cognition: Alert; Cooperative No data recorded No data recorded No data recorded Volitional Swallow: Able to elicit Exam Limitations: No limitations Goal Planning: Prognosis for improved oropharyngeal function: Good No data recorded No data recorded Patient/Family Stated Goal: To be able to have something to eat/drink. Consulted and agree with results and recommendations: Patient; Nurse Pain: Pain Assessment Facial Expression:  1 Body Movements: 1 Muscle Tension: 1 Compliance with ventilator (intubated pts.): N/A Vocalization (extubated pts.): 0 CPOT Total: 3 End of Session: Start Time:SLP Start Time (ACUTE ONLY): 1225 Stop Time: SLP Stop Time (ACUTE ONLY): 1245 Time Calculation:SLP Time Calculation (min) (ACUTE ONLY): 20 min Charges: SLP Evaluations $ SLP Speech  Visit: 1 Visit SLP Evaluations $BSS Swallow: 1 Procedure $MBS Swallow: 1 Procedure SLP visit diagnosis: SLP Visit Diagnosis: Dysphagia, oropharyngeal phase (R13.12) Past Medical History: Past Medical History: Diagnosis Date  Breast cancer (HCC)   right  Colon stricture (HCC) 05/07/2019  Colonic stricture (HCC) 11/19/2022  Crohn's colitis, with intestinal obstruction (HCC) 09/17/2013  Dx 2006 - right and left colon involved 2011 - pan-colitis 09/17/2013 left colitis 60-20 cm March 2021 subtotal colectomy ileosigmoid anastomosis for colonic strictures-Dr. Maisie Fus    Crohn's disease [facility])   Exacerbation of Crohn's disease of large intestine (HCC) 12/13/2022  Family history of breast cancer 06/11/2020  History of abdominal subtotal colectomy 11/19/2022  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 10/17/2010  Rectal bleeding   Uterine fibroid   Vitamin D deficiency 09/24/2013 Past Surgical History: Past Surgical History: Procedure Laterality Date  AXILLARY LYMPH NODE DISSECTION Right 11/09/2020  Procedure: RIGHT AXILLARY LYMPH NODE DISSECTION;  Surgeon: Emelia Loron, MD;  Location: [facility];  Service: General;  Laterality: Right;  BIOPSY  07/01/2020  Procedure: BIOPSY;  Surgeon: Benancio Deeds, MD;  Location: [facility];  Service: Gastroenterology;;  BIOPSY  01/27/2021  Procedure: BIOPSY;  Surgeon: Benancio Deeds, MD;  Location: [facility];  Service: Gastroenterology;;  BIOPSY  09/07/2021  Procedure: BIOPSY;  Surgeon: Lynann Bologna, MD;  Location: [facility];  Service: Gastroenterology;;  BIOPSY  11/21/2022  Procedure: BIOPSY;  Surgeon: Lemar Lofty., MD;  Location: [facility];  Service: Gastroenterology;;  BREAST BIOPSY Right 03/24/2022  [place] RT BREAST BX W LOC DEV 1ST LESION IMG BX SPEC US GUIDE 03/24/2022 [facility]  BREAST BIOPSY  04/28/2022  [place] RT RADIOACTIVE SEED LOC 04/28/2022 [facility]  BREAST IMPLANT REMOVAL Right 08/07/2022   Procedure: REMOVAL  OF RIGHT BREAST IMPLANTS;  Surgeon: Allena Napoleon, MD;  Location: [facility];  Service: Plastics;  Laterality: Right;  BREAST RECONSTRUCTION WITH PLACEMENT OF TISSUE EXPANDER AND FLEX HD (ACELLULAR HYDRATED DERMIS) Bilateral 11/09/2020  Procedure: BILATERAL BREAST RECONSTRUCTION WITH PLACEMENT OF TISSUE EXPANDER AND FLEX HD (ACELLULAR HYDRATED DERMIS);  Surgeon: Allena Napoleon, MD;  Location: [facility];  Service: Plastics;  Laterality: Bilateral;  CAPSULOTOMY Bilateral 07/25/2021  Procedure: CAPSULOTOMY;  Surgeon: Allena Napoleon, MD;  Location: [facility];  Service: Plastics;  Laterality: Bilateral;  COLECTOMY  06/27/2019  COLONOSCOPY  2015  ESOPHAGOGASTRODUODENOSCOPY (EGD) WITH PROPOFOL N/A 11/21/2022  Procedure: ESOPHAGOGASTRODUODENOSCOPY (EGD) WITH PROPOFOL;  Surgeon: Lemar Lofty., MD;  Location: [facility];  Service: Gastroenterology;  Laterality: N/A;  FLEXIBLE SIGMOIDOSCOPY N/A 07/01/2020  Procedure: FLEXIBLE SIGMOIDOSCOPY;  Surgeon: Benancio Deeds, MD;  Location: [facility];  Service: Gastroenterology;  Laterality: N/A;  FLEXIBLE SIGMOIDOSCOPY N/A 01/27/2021  Procedure: FLEXIBLE SIGMOIDOSCOPY;  Surgeon: Benancio Deeds, MD;  Location: [facility];  Service: Gastroenterology;  Laterality: N/A;  FLEXIBLE SIGMOIDOSCOPY N/A 09/07/2021  Procedure: FLEXIBLE SIGMOIDOSCOPY;  Surgeon: Lynann Bologna, MD;  Location: [facility];  Service: Gastroenterology;  Laterality: N/A;  FLEXIBLE SIGMOIDOSCOPY N/A 11/21/2022  Procedure: FLEXIBLE SIGMOIDOSCOPY;  Surgeon: Meridee Score Netty Starring., MD;  Location: [facility];  Service: Gastroenterology;  Laterality: N/A;  LAPAROTOMY N/A 12/14/2022  Procedure:  EXPLORATORY LAPAROTOMY WITH SMALL BOWEL RESECTION;  Surgeon: Moise Boring, MD;  Location: [facility];  Service: General;  Laterality: N/A;  LAPAROTOMY N/A 12/16/2022  Procedure: REOPENING LAPAROTOMY, ILEOSTOMY;  ABDOMINAL CLOSURE;  Surgeon:  Moise Boring, MD;  Location: [facility];  Service: General;  Laterality: N/A;  NIPPLE SPARING MASTECTOMY Bilateral 11/09/2020  Procedure: BILATERAL NIPPLE SPARING MASTECTOMY;  Surgeon: Emelia Loron, MD;  Location: [facility];  Service: General;  Laterality: Bilateral;  PORT-A-CATH REMOVAL N/A 07/25/2021  Procedure: REMOVAL PORT-A-CATH;  Surgeon: Allena Napoleon, MD;  Location: [facility];  Service: Plastics;  Laterality: N/A;  PORTACATH PLACEMENT N/A 06/23/2020  Procedure: INSERTION PORT-A-CATH;  Surgeon: Emelia Loron, MD;  Location: [facility];  Service: General;  Laterality: N/A;  START TIME OF 11:00 AM FOR 60 MINUTES [place] [facility]  PORTACATH PLACEMENT Left 05/02/2022  Procedure: INSERTION PORT-A-CATH;  Surgeon: Emelia Loron, MD;  Location: [facility];  Service: General;  Laterality: Left;  RADIOACTIVE SEED GUIDED EXCISIONAL BREAST BIOPSY Right 05/02/2022  Procedure: RADIOACTIVE SEED GUIDED RIGHT BREAST MASS EXCISION;  Surgeon: Emelia Loron, MD;  Location: [facility];  Service: General;  Laterality: Right;  REMOVAL OF BILATERAL TISSUE EXPANDERS WITH PLACEMENT OF BILATERAL BREAST IMPLANTS Bilateral 07/25/2021  Procedure: REMOVAL OF BILATERAL TISSUE EXPANDERS WITH PLACEMENT OF BILATERAL BREAST IMPLANTS;  Surgeon: Allena Napoleon, MD;  Location: [facility];  Service: Plastics;  Laterality: Bilateral;  1.5 Dimas Aguas, MA, CCC-SLP Acute Rehab SLP [phone] Fleet Contras 12/23/2022, 2:54 PM   Anti-infectives: Anti-infectives (From admission, onward)    Start     Dose/Rate Route Frequency Ordered Stop   12/23/22 2000  vancomycin (VANCOREADY) IVPB 1250 mg/250 mL  Status:  Discontinued        1,250 mg 166.7 mL/hr over 90 Minutes Intravenous Every 36 hours 12/22/22 0612 12/22/22 1103   12/22/22 0645  vancomycin (VANCOREADY) IVPB 1250 mg/250 mL        1,250 mg 166.7 mL/hr over 90 Minutes Intravenous   Once 12/22/22 0551 12/22/22 0805   12/22/22 0600  piperacillin-tazobactam (ZOSYN) IVPB 3.375 g        3.375 g 12.5 mL/hr over 240 Minutes Intravenous Every 8 hours 12/22/22 0551     12/13/22 1000  piperacillin-tazobactam (ZOSYN) IVPB 3.375 g  Status:  Discontinued        3.375 g 12.5 mL/hr over 240 Minutes Intravenous Every 8 hours 12/13/22 4401 12/20/22 1333        Assessment/Plan Hx Crohn's disease s/p subtotal colectomy  Internal hernia with bowel ischemia - POD 11 exploratory laparotomy with small bowel resection 9/5 Dr. Hillery Hunter - POD 9 s/p exploratory laparotomy, ileostomy, abdominal closure 9/7 Dr. 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 Highly likely to have high ostomy output  - Cont TPN.  - Imodium 2mg  BID, Iron 325mg  BID, Fibercon 625mg  BID started 9/11 to slow ileostomy output down.  Increased loperamide from 2 mg to 4 twice daily 9/14.  Monitor volume of ostomy output to determine if needs increase in loperamide. May need intermittent fluid replacement for this. Will likely need TPN at d/c - TF's currently held. Reported to be held for high ileostomy output. Will see about restarting trickle TF's after CT - NPO per SLP. Follow for recommendations  - WOCN following for new ostomy. Plan ostomy clinic referral at d/c - Midline wound with hx of bleeding leading >> ABL anemia >>  getting PRBC. No signs of active bleeding. Cont WTD dressing changes and consider wound vac in near future. Follow hgb - Will get CT A/P today given febrile overnight. Already on abx. Will revisit abx duration after CT - PT/OT  ID - Zosyn 9/4>>9/11. 9/13 >> Tmax 101.3.  FEN - NPO per SLP. TF's held currently. Consider trickle TF's pending CT scan findings. TPN VTE - SCDs, HEP GTT held for bleeding/ anemia Foley - present, RN updating UOP. Consider TOV when oob   --per CCM-- Shock - off pressors VDRF - extubated  Recent cardiac arrest  9/3 AKI - Cr stable at 1.41 Anemia - Transfuse to keep hemoglobin above 7.  Received IV iron supplementation - she has short gut so worried she will not absorb iron well orally. Hx metastatic breast cancer   LOS: 12 days    Jacinto Halim , [facility] Surgery 12/25/2022, 8:21 AM Please see Amion for pager number during day hours 7:00am-4:30pm

## 2022-12-25 NOTE — Progress Notes (Signed)
Inpatient Rehab Admissions Coordinator:   Per OT recommendations pt was screened for CIR candidacy by Estill Dooms, PT, DPT.  Note unable to tolerate OOB at this time.  Will follow for progress with next therapy session to assess tolerance.  If able to demonstrate improvement we can assess for candidacy, otherwise may need SNF.  Estill Dooms, PT, DPT Admissions Coordinator [phone] 12/25/22  4:54 PM

## 2022-12-25 NOTE — Progress Notes (Signed)
PHARMACY - TOTAL PARENTERAL NUTRITION CONSULT NOTE   Indication: Short bowel syndrome  Patient Measurements: Height: 5\' 4"  (162.6 cm) Weight: 68.3 kg (150 lb 9.2 oz) IBW/kg (Calculated) : 54.7   Body mass index is 25.85 kg/m. Usual Weight: 46.3 kg from 8/28  Assessment: 47 y.o. female with PMH of recurrent breast cancer not currently on treatment, Crohn's disease s/p subtotal colectomy in 2021, recurrent obstructions, colonic stricture, recent hospitalization 8/10-8/15/2024 for obstruction who presented on 12/12/2022 with abdominal pain, nausea, vomiting diarrhea.    9/4 Admit; found down in ED bathroom with no pulse, CPR > intubated 9/5 s/p ex-lap, found to have internal hernia with small bowel ischemia, only 105 cm of small bowel remaining OR 9/7 for closure, ostomy creation 9/8 pharmacy consulted to manage TPN 9/15 Surgery requesting transition to cyclic TPN  Glucose / Insulin: no Hx DM - steroids PTA for Crohn's disease; have been weaned off as of 9/13 - CBGs goal < 180. Range: 96-136. No SSI required/24 hrs Electrolytes: All WNL, including CorrCa (9.6) Renal: Baseline SCr <1. SCr slowly improving. BUN elevated/stable Hepatic: LFT, T.bili WNL. Albumin remains low  - TG (9/11) normalized after stopping propofol I/O: I/O Net: Positive -mIVF: none -UOP: 1600 mL, ileostomy: 550 mL - getting fiber, iron, loperamide per tube GI Imaging:  - CTA 9/10 shows new PE, but also abdominal ascites GI Surgeries / Procedures:  - OR 9/5 ex lap, internal hernia w/ small bowel ischemia, SBR & celiotomy; > 105 cm small bowel remaining; - OR 9/7 for closure, ostomy creation  Central access: triple lumen CVC R subclavian 9/4, triple lumen CVC R femoral 9/4;  PAC 05/02/22 TPN start date: 12/17/22  Nutritional Goals: TPN provides 95 g protein and 1737 kcal per day -Goal rate for continuous was 70 mL/hr  RD Assessment: Estimated Needs Total Energy Estimated Needs: 1600-1800 Total Protein  Estimated Needs: 85-95 grams Total Fluid Estimated Needs: 1.6L/day  Current Nutrition:  NPO TPN   Plan: Continue cycling TPN with 18-hr cycle starting at 18:00 tonight Anticipate prolonged/indefinite TPN dependency Max glucose infusion rate 3.83 mg/kg/min on 18-hr cycle (will be 5.91 on 12-hr cycle) Electrolytes in TPN: no changes Na - 50 mEq/L K - 60 mEq/L Ca - 5 mEq/L Mg - 10 mEq/L Phos - 15 mmol/L Cl:Ac ratio - 1:1 Add standard MVI and trace elements to TPN Chromium on hold d/t critical shortage Resume CBG checks 4x daily while cycling TPN On TPN Off TPN 1-2 hr after completing ramp-up 1 hr after TPN stopped MIVF per primary Monitor TPN labs on Mon/Thurs  Cindi Carbon, PharmD 12/25/22 7:58 AM

## 2022-12-25 NOTE — Consult Note (Signed)
WOC consulted for application of NPWT to the midline, however surgery has decided to monitor wound status for now. Supplies in the room. WOC will follow up with bedside nursing on needs for wound and ostomy care.   Natelie Ostrosky [facility], CNS, [employer] [phone]

## 2022-12-25 NOTE — Progress Notes (Signed)
Physical Therapy Discharge Patient Details Name: [name] MRN: [id number] DOB: [birth year] Today's Date: 12/25/2022 Time:  -     Patient discharged from PT services secondary to medical decline - will need to re-order PT to resume therapy services. Noted ongoing bleeding from wound and to return to surgery, per RN. Blanchard Kelch PT Acute Rehabilitation Services Office [phone] Weekend [phone]     [name]     [name] 12/25/2022, 8:31 AM

## 2022-12-25 NOTE — Telephone Encounter (Signed)
Noted. Scheduling team has been notified to cancel Entyvio appt's. @[facility] , please d/c her tx plan. Thanks Selena Batten

## 2022-12-25 NOTE — Progress Notes (Signed)
Nutrition Follow-up  DOCUMENTATION CODES:   Non-severe (moderate) malnutrition in context of chronic illness  INTERVENTION:   -Continue TPN management per Pharmacy -Daily weights while on TPN   -Will monitor for diet advancement    Patient will be TPN dependent long term.    NUTRITION DIAGNOSIS:   Moderate Malnutrition related to chronic illness (Crohn's disease; metastatic breast cancer) as evidenced by moderate fat depletion, moderate muscle depletion, percent weight loss (24% in 4.5 months).  Ongoing.  GOAL:   Patient will meet greater than or equal to 90% of their needs  Meeting with TPN  MONITOR:   Labs, Weight trends, TF tolerance, I & O's, Diet advancement (TPN)  REASON FOR ASSESSMENT:   Consult Assessment of nutrition requirement/status, Wound healing, Diet education "Patient status post massive bowel resection with short gut.  105 cm remaining.  In the education on managing short gut dietary supplementation etc. appreciated. "  ASSESSMENT:   47 y.o. female with PMH of recurrent breast cancer not currently on treatment, Crohn's disease s/p subtotal colectomy in 2021, recurrent obstructions, colonic stricture, recent hospitalization 8/10-8/15/2024 for obstruction who presented on 12/12/2022 with abdominal pain, nausea, vomiting diarrhea.  9/4 Admit; found down in ED bathroom with no pulse, intubated 9/5 s/p ex-lap, found to have internal hernia with small bowel ischemia, only ~100cm of small bowel remaining 9/7 ex-lap, I&D, end ileostomy 9/8 TPN started at 6mL/hr 9/9 TPN increased to 75mL/hr 9/10 extubated, NGT removed but then replaced. 9/12: TF stopped 9/14: small bore NGT placed for medications, MBS performed  TPN transitioned to cyclic: Start rate at 49 mL/hr for 1 hour.  Increase rate to 99 mL/hr for 16 hours.  Decrease rate to 49 mL/hr for 1 hour.  Provides 1737 kcals and 94g protein.  Patient continues to be NPO. No longer receiving tube feeds  given high ostomy output. Per SLP eval 9/14, recommend pt remain NPO.   Admission weight: 125 lbs Current weight: 150 lbs  Medications: Ferrous sulfate, Imodium, Fibercon  Labs reviewed: CBGs: 96-123   Diet Order:   Diet Order             Diet NPO time specified Except for: Other (See Comments)  Diet effective now                   EDUCATION NEEDS:   Education needs have been addressed  Skin:  Skin Assessment: Skin Integrity Issues: Skin Integrity Issues:: Incisions Incisions: Abdomen  Last BM:  9/16 -type 7 -ileostomy  Height:   Ht Readings from Last 1 Encounters:  12/13/22 5\' 4"  (1.626 m)    Weight:   Wt Readings from Last 1 Encounters:  12/25/22 68.3 kg    BMI:  Body mass index is 25.85 kg/m.  Estimated Nutritional Needs:   Kcal:  1600-1800  Protein:  85-95 grams  Fluid:  1.6L/day  Tilda Franco, MS, RD, LDN Inpatient Clinical Dietitian Contact information available via Amion

## 2022-12-25 NOTE — Evaluation (Signed)
Physical Therapy Evaluation [name] Details Name: [name] MRN: [id number] DOB: 11/19/1975 Today's Date: 12/25/2022  History of Present Illness  [name] is a 47 year old female who presented on 9/3 with abdominal distension and nausea/vomiting. 9/4 [name] was found in ED bathroom with no pulse with ROSC in about 5 mins. [name] was intubated at this time. 9/5 [name] underwent exploratory laparotomy that revealed internal hernia with associated small bowel ischemia with [name] undergoing bowel resection. 9/7 [name] returned to OR for ostomy placement. [name] was extubated on 9/10 and also found to have PE. PMH: breast cancer, Chron's disease, IDA, recent hospitalization 8/10 to 8/15 with bowel obstruction and EGD  Clinical Impression  Pt admitted with above diagnosis.  Pt currently with functional limitations due to the deficits listed below (see PT Problem List). Pt will benefit from acute skilled PT to increase their independence and safety with mobility to allow discharge.     The [name]  agreeable to sitting onto bed edge, requires max support, sits with min guard. Attempted to stand but unable to power up. Assisted back into bed.  HR  115-145. RN in room and aware. SPO2  100%.  [name] independent at baseline. [name]'s daughter plans to move in with [name] at DC.  [name] will benefit from intensive inpatient follow up therapy, >3 hours/day when medically stable.      If plan is discharge home, recommend the following: A lot of help with walking and/or transfers;A lot of help with bathing/dressing/bathroom;Assistance with cooking/housework;Assist for transportation   Can travel by private vehicle        Equipment Recommendations Rolling walker (2 wheels)  Recommendations for Other Services       Functional Status Assessment [name] has had a recent decline in their functional status and demonstrates the ability to make significant improvements in function in a reasonable  and predictable amount of time.     Precautions / Restrictions Precautions Precautions: Fall Precaution Comments: abdominal precautions, abdominal binder Restrictions Weight Bearing Restrictions: No      Mobility  Bed Mobility   Bed Mobility: Rolling, Sidelying to Sit, Sit to Sidelying Rolling: Max assist Sidelying to sit: Max assist, +2 for physical assistance, +2 for safety/equipment     Sit to sidelying: +2 for safety/equipment, +2 for physical assistance General bed mobility comments: with increased time and cues for log rolling to prevent pressure on abdomen. [name] needed physical A for trunk and BLE movement.    Transfers Overall transfer level: Needs assistance Equipment used: Rolling walker (2 wheels) Transfers: Sit to/from Stand Sit to Stand: Max assist, +2 physical assistance, +2 safety/equipment           General transfer comment: attempted brief stand from bed, unable to power to stnad.    Ambulation/Gait                  Stairs            Wheelchair Mobility     Tilt Bed    Modified Rankin (Stroke Patients Only)       Balance Overall balance assessment: Needs assistance Sitting-balance support: Bilateral upper extremity supported, Feet supported Sitting balance-Leahy Scale: Fair Sitting balance - Comments: UE support       Standing balance comment: unable to power up to stand                             Pertinent Vitals/Pain Pain Assessment Pain Assessment:  Faces Faces Pain Scale: Hurts whole lot Pain Location: abdomen with attempt to stand Pain Descriptors / Indicators: Grimacing, Constant Pain Intervention(s): Limited activity within [name]'s tolerance, [name] requesting pain meds-RN notified    Home Living Family/[name] expects to be discharged to:: Private residence Living Arrangements: Children Available Help at Discharge: Family;Available 24 hours/day Type of Home: House Home Access: Level  entry       Home Layout: One level Home Equipment: None      Prior Function Prior Level of Function : Independent/Modified Independent                     Extremity/Trunk Assessment   Upper Extremity Assessment Upper Extremity Assessment: Generalized weakness    Lower Extremity Assessment Lower Extremity Assessment: Generalized weakness    Cervical / Trunk Assessment Cervical / Trunk Assessment: Other exceptions Cervical / Trunk Exceptions: guarding abdoment  Communication      Cognition Arousal: Alert Behavior During Therapy: WFL for tasks assessed/performed Overall Cognitive Status: Difficult to assess                                 General Comments: [name] was very soft spoken during session. patietn had family member present in room who was able to help with understanding [name] and providing PLOF as well.        General Comments      Exercises General Exercises - Lower Extremity Ankle Circles/Pumps: AROM Quad Sets: AROM   Assessment/Plan    PT Assessment [name] needs continued PT services  PT Problem List Decreased strength;Decreased activity tolerance;Decreased mobility;Decreased safety awareness;Decreased range of motion;Decreased balance;Decreased knowledge of precautions;Pain;Cardiopulmonary status limiting activity       PT Treatment Interventions DME instruction;Therapeutic activities;Gait training;Functional mobility training;Therapeutic exercise;[name]/family education    PT Goals (Current goals can be found in the Care Plan section)  Acute Rehab PT Goals [name] Stated Goal: to gohome PT Goal Formulation: With [name]/family Time For Goal Achievement: 01/08/23 Potential to Achieve Goals: Good    Frequency Min 1X/week     Co-evaluation PT/OT/SLP Co-Evaluation/Treatment: Yes Reason for Co-Treatment: To address functional/ADL transfers;For [name]/therapist safety;Complexity of the [name]'s impairments  (multi-system involvement) PT goals addressed during session: Mobility/safety with mobility OT goals addressed during session: ADL's and self-care       AM-PAC PT "6 Clicks" Mobility  Outcome Measure Help needed turning from your back to your side while in a flat bed without using bedrails?: A Lot Help needed moving from lying on your back to sitting on the side of a flat bed without using bedrails?: A Lot Help needed moving to and from a bed to a chair (including a wheelchair)?: Total Help needed standing up from a chair using your arms (e.g., wheelchair or bedside chair)?: Total Help needed to walk in hospital room?: Total Help needed climbing 3-5 steps with a railing? : Total 6 Click Score: 8    End of Session Equipment Utilized During Treatment: Other (comment) (abd binder) Activity Tolerance: [name] tolerated treatment well;[name] limited by pain [name] left: in bed;with call bell/phone within reach;with nursing/sitter in room;with family/visitor present;with bed alarm set Nurse Communication: Mobility status PT Visit Diagnosis: Unsteadiness on feet (R26.81);Muscle weakness (generalized) (M62.81);Pain;Difficulty in walking, not elsewhere classified (R26.2)    Time: 1324-4010 PT Time Calculation (min) (ACUTE ONLY): 18 min   Charges:   PT Evaluation $PT Eval Low Complexity: 1 Low   PT General Charges $$ ACUTE  PT VISIT: 1 Visit         Blanchard Kelch PT Acute Rehabilitation Services Office [phone] Weekend [phone]   [name] 12/25/2022, 4:53 PM

## 2022-12-25 NOTE — Progress Notes (Signed)
Chaplain engaged in an initial visit with [name], working to simply establish relationship and build rapport. [name] voiced she was doing okay today. Chaplain plans to spend time with [facility] tomorrow.     12/25/22 1600  Spiritual Encounters  Type of Visit Initial  Care provided to: Patient

## 2022-12-25 NOTE — Progress Notes (Signed)
NAME:  [name], MRN:  [id number], DOB:  01-28-1976, LOS: 12 ADMISSION DATE:  12/12/2022, CONSULTATION DATE:  12/13/2022 REFERRING MD:  Rubin Payor - EDP CHIEF COMPLAINT:  Cardiac arrest    History of Present Illness:  47 year old female with past medical history of recurrent breast cancer not currently on treatment, Crohn's disease s/p subtotal colectomy in 2021 recurrent obstructions, colonic stricture, IDA, recent hospitalization 8/10-8/15/2024 for obstruction seen by GI and general surgery. Underwent EGD and flexible sigmoidoscopy and discharged on steroids who presented to the emergency department on 12/12/2022 with abdominal pain, nausea, vomiting diarrhea. In the ED tachycardic, hypertensive. ED provider notes abdominal distention. She was given 2L IVF, morphine, IV PPI. Labs with WBC 10.8, Hgb 10.7, K 3.2. CT AP was ordered and showed markedly dilated distal small bowel consistent with chronic obstruction. No changes since prior study. Patient was admitted to [facility]. They consulted GI for input. Unfortunately, around 0820 this morning patient was found on the floor in the bathroom in ED pulseless. They estimate downtime around 5 minutes. ACLS was initiated. Concern for possible torsades de pointes and given IV mag, defibrillated. Coded for around 5 minutes. She was intubated. She had copious vomitus in the ETT. ROSC achieved and ICU was consulted. Notably, she had I-stat peri-code which showed hemoglobin of 5.5 down from 10 overnight.   Pertinent Medical History:  Crohn's disease with previous bowel obstruction s/p subtotal colectomy 2021, colonic stricture, IDA, breast cancer s/p mastectomy 2022  Significant Hospital Events: Including procedures, antibiotic start and stop dates in addition to other pertinent events   9/3: Admitted to [facility] for abdominal distention, presumed Crohn's exacerbation 9/4: ~0820 Cardiac arrest in ED. Admitted to ICU intubated, sedated. Hgb 5.5. Placed right femoral CVC.  Received 3 UPRBC, 3 FFP. 9/5: - Hgb stable at 11.7 after 3u PRBC, 3 FPP 9/5 overnight increasing pressor requirements - added vaso. Bladder pressures 28, went for exploratory lap w/ sb resxn, and celiotomy, intra op findings: internal hernia w/ associated small bowel ischemia. Also seen by GI and palliative. Full scope of care desired  9/6 Some ST changes in lateral leads. Weaning pressors. Removing femoral line and placing internal jugular. Renal fxn worse, on-going metabolic acidosis.  9/7: Back to OR for ostomy placement.  9/8: Off levo 9/9: Weaning sedation. Following commands. Tried PS of 5/5 which Vt did not hold up. Giving IV lasix for anasarca, she's 20+ L up on admission. Will try SBT again this afternoon.  9/10: Extubated, +PE on heparin gtt 9/11: Saturation of abdominal dressing and Hgb drift to 6.8 requiring blood transfusion (1U PRBCs). Heparin gtt paused. 9/12: Mental status improved, more interactive in care. Hgb stable/slight drift. Midline abdominal wound with continued bleeding today, vessel in deeper adipose tissue, seen by CCS.  Interim History / Subjective:  Low BP overnight. Febrile overnight. She had some midline abdominal pain, ~5/10, better with pain meds. Had bleeding from lower R side of her abdominal incision over the weekend, but better now. Surgery has already changed dressing twice this morning for their exams.   Objective:  Blood pressure 113/77, pulse (!) 101, temperature 100 F (37.8 C), resp. rate (!) 23, height 5\' 4"  (1.626 m), weight 68.3 kg, last menstrual period 04/24/2021, SpO2 100%.        Intake/Output Summary (Last 24 hours) at 12/25/2022 0952 Last data filed at 12/25/2022 0945 Gross per 24 hour  Intake 4570.22 ml  Output 4200 ml  Net 370.22 ml   [employer]  12/23/22 0704 12/24/22 0425 12/25/22 0500  Weight: 60.9 kg 64.7 kg 68.3 kg   Physical Examination: General: chronically ill appearing woman lying in bed in NAD HEENT: [place]/AT,  Neuro:  awake, alert, not talking much, but answering questions with nodding and pointing.  CV: S1S2, tachycardic, reg rhythm PULM: breathing comfortably on RA, CTAB, GI: soft, hypoactive bowel sounds. No bleeding onto midline dressing currently. Nontender to palpation. Ostomy pink. Extremities:+edema, no cyanosis Skin: dry, no diffuse rashes  BUN 51 Cr 1.41 WBC 23.6 H/H 6.7/20.6 Platelets 176 INR 1.5 Last blood cultures 9/4: NG  Resolved Hospital Problem List:  Cardiac arrest  Shock Shock liver Bradycardia  Acute hypoxic respiratory failure   Assessment & Plan:  Sepsis due to presumed intra-abdominal source: Ischemic gut after strangulation of hernia. Has been having ongoing fevers and persistent leukocytosis.  -- Remains on zosyn.  --remains off vasopressors.  -With ongoing fevers, worry about ongoing inflammation in her belly. Other potential sources are DVT, line infections.  -still needs central access, especially dedicated access for TPN -d/c foley -agree with repeat abd CT today per surgery   SBO status post ex lap 9/5 and ileostomy creation 9/7: --appreciate surgery's management -- TPN; enteral nutrition per surgery --routine ostomy care -- bleeding management around incision per surgery-- has been getting silver nitrate topically  Pulmonary embolism: Left lower lobe segmental -- unfortunately until bleeding is controlled, unable to give full [facility] -may need to consider IVC temporary filter at some point   Tachycardia: multifactorial due to high metabolic rate from critical illness, worry about pain and anemia contributing. PE potentially contributing as well.  --con't pain meds-- have been recently increased and appear to be working -check CVP; none charted from yesterday   ST segment changes on EKG, potentially due to critical illness: Echo on 9/11 is reassuring. --tele monitoring -monitor electrolytes, replete as needed   Hypertension: Suspect pain anxiety etc.   --  con't metoprolol; may need to decrease dose -monitor   AKI: Improving, likely ATN in the setting of severe sepsis/septic shock -strict I/O -renally dose meds, avoid nephrotoxic meds   Acute on chronic anemia, ABLA, anemia of critical il --transfuse for Hb <7 or hemodynamically significant bleeding -monitor   Hyperglycemia -SSI PRN -goal BG 140-180 -she does not have insulin in her TPN   Recurrent breast cancer: -- Recommend oncology follow-up once discharged -palliative care consulted this admission  PCCM will be available as needed. No current medical critical care needs. D/w Dr. Pola Corn.  Best Practice (right click and "Reselect all SmartList Selections" daily)   Diet/type: TPN DVT prophylaxis: systemic heparin (paused at present 9/13)  GI prophylaxis: PPI Lines: Central line and yes and it is still needed, port  Foley:  Yes, and it is no longer needed and removal ordered  Code Status:  full code Last date of multidisciplinary goals of care discussion: Patient updated at bedside 9/16  Critical care time:    Steffanie Dunn, DO 12/25/22 10:26 AM [facility]  For contact information, see Amion. If no response to pager, please call PCCM consult pager. After hours, 7PM- 7AM, please call Elink.

## 2022-12-25 NOTE — Care Management (Addendum)
Transition of Care [facility]) - Inpatient Brief Assessment   Patient Details  Name: VIONE EVERTON MRN: [id number] Date of Birth: Sep 16, 1975  [facility] CM/SW Contact:    Lavenia Atlas, RN Phone Number: 12/25/2022, 3:04 PM   Clinical Narrative: Per chart review patient may need home IV infusions and home TPN dependent indefinitely.   TOC will continue to follow for discharge needs.  If any needs arise please consult TOC.    Transition of Care Asessment: Insurance and Status: Insurance coverage has been reviewed Patient has primary care physician: Yes Home environment has been reviewed: resides w/ spousee/ significant other Prior level of function:: independent Prior/Current Home Services: No current home services Social Determinants of Health Reivew: SDOH reviewed no interventions necessary Readmission risk has been reviewed: Yes Transition of care needs: no transition of care needs at this time

## 2022-12-25 NOTE — Evaluation (Signed)
Occupational Therapy Evaluation Patient Details Name: [name] MRN: [id number] DOB: [birth year] Today's Date: 12/25/2022   History of Present Illness Patient is a 47 year old female who presented on 9/3 with abdominal distension and nausea/vomiting. 9/4 patient was found in ED bathroom with no pulse with ROSC in about 5 mins. Patient was intubated at this time. 9/5 patient underwent exploratory laparotomy that revealed internal hernia with associated small bowel ischemia with patient undergoing bowel resection. 9/7 patient returned to OR for ostomy placement. Patient was extubated on 9/10 and also found to have PE. PMH: breast cancer, Chron's disease, IDA, recent hospitalization 8/10 to 8/15 with bowel obstruction and EGD   Clinical Impression   Patient is a 47 year old female who was admitted for above. Patient was living at home alone independently with no AD prior level. Currently, patient is +2 for bed mobility with increased abdominal pain with movement. Patient has been in bed for extended amount of time secondary to medical complexities and is profoundly weak .patient is motivated to participate and has an adult daughter who plans to live with patient at time of d/c. Patients HR noted to increase to 145 bpm during sitting EOB. Nurse present in room during session. Patient will benefit from intensive inpatient follow up therapy, >3 hours/day     If plan is discharge home, recommend the following: Two people to help with walking and/or transfers;Two people to help with bathing/dressing/bathroom;Direct supervision/assist for medications management;Assistance with cooking/housework;Assist for transportation;Help with stairs or ramp for entrance;Direct supervision/assist for financial management    Functional Status Assessment  Patient has had a recent decline in their functional status and demonstrates the ability to make significant improvements in function in a reasonable and predictable  amount of time.  Equipment Recommendations  Other (comment) (defer to next venue)       Precautions / Restrictions Precautions Precautions: Fall Precaution Comments: abdominal precautions, abdominal binder Restrictions Weight Bearing Restrictions: No      Mobility Bed Mobility Overal bed mobility: Needs Assistance Bed Mobility: Rolling, Sidelying to Sit, Sit to Sidelying Rolling: Max assist Sidelying to sit: Max assist, +2 for physical assistance, +2 for safety/equipment     Sit to sidelying: +2 for safety/equipment, +2 for physical assistance General bed mobility comments: with increased time and cues for log rolling to prevent pressure on abdomen. patient needed physical A for trunk and BLE movement.        Balance Overall balance assessment: Needs assistance Sitting-balance support: Bilateral upper extremity supported, Feet supported Sitting balance-Leahy Scale: Fair             ADL either performed or assessed with clinical judgement   ADL Overall ADL's : Needs assistance/impaired Eating/Feeding: NPO   Grooming: Minimal assistance;Bed level   Upper Body Bathing: Bed level;Maximal assistance   Lower Body Bathing: Bed level;Total assistance   Upper Body Dressing : Bed level;Maximal assistance   Lower Body Dressing: Bed level;Total assistance   Toilet Transfer: +2 for physical assistance;+2 for safety/equipment Toilet Transfer Details (indicate cue type and reason): patient was agreeable to attempt standing with patient having about 1 inch clearance from bed with reports of increased pain and patietn declined to further attempt and returned to bed at this time. Toileting- Clothing Manipulation and Hygiene: Bed level;Total assistance               Vision Baseline Vision/History: 1 Wears glasses Vision Assessment?: No apparent visual deficits  Pertinent Vitals/Pain Pain Assessment Pain Assessment: Faces Faces Pain Scale: Hurts whole  lot Pain Location: abdomen with attempt to stand Pain Descriptors / Indicators: Grimacing, Constant Pain Intervention(s): Limited activity within patient's tolerance, Monitored during session, Patient requesting pain meds-RN notified, RN gave pain meds during session     Extremity/Trunk Assessment Upper Extremity Assessment Upper Extremity Assessment: Generalized weakness   Lower Extremity Assessment Lower Extremity Assessment: Defer to PT evaluation          Cognition Arousal: Alert Behavior During Therapy: [facility] for tasks assessed/performed Overall Cognitive Status: Difficult to assess         General Comments: patient was very soft spoken during session. patietn had family member present in room who was able to help with understanding patient and providing PLOF as well.     General Comments       Exercises Other Exercises Other Exercises: provided with yellow theraband for BUE exercises educated on bicep and tricep exercises with yellow band stied to bed bilateraly. patient verbalized and demonstrated understanding. Other Exercises: educaetd on gentle shoulder and neck ROM to maintain ROM and prevent stiffness in bed. patient verbalized understanding.        Home Living Family/patient expects to be discharged to:: Private residence Living Arrangements: Spouse/significant other Available Help at Discharge: Family Type of Home: House Home Access: Level entry     Home Layout: One level     Bathroom Shower/Tub: Dietitian: None          Prior Functioning/Environment Prior Level of Function : Independent/Modified Independent                        OT Problem List: Decreased activity tolerance;Impaired balance (sitting and/or standing);Decreased coordination;Decreased safety awareness;Decreased knowledge of precautions;Decreased knowledge of use of DME or AE;Pain;Cardiopulmonary status limiting activity      OT  Treatment/Interventions: Self-care/ADL training;Energy conservation;Therapeutic exercise;DME and/or AE instruction;Therapeutic activities;Patient/family education;Balance training    OT Goals(Current goals can be found in the care plan section) Acute Rehab OT Goals Patient Stated Goal: to sit up OT Goal Formulation: With patient Time For Goal Achievement: 01/08/23 Potential to Achieve Goals: Fair  OT Frequency: Min 2X/week    Co-evaluation PT/OT/SLP Co-Evaluation/Treatment: Yes Reason for Co-Treatment: To address functional/ADL transfers;For patient/therapist safety;Complexity of the patient's impairments (multi-system involvement) PT goals addressed during session: Mobility/safety with mobility OT goals addressed during session: ADL's and self-care      AM-PAC OT "6 Clicks" Daily Activity     Outcome Measure Help from another person eating meals?: Total (NPO) Help from another person taking care of personal grooming?: A Lot Help from another person toileting, which includes using toliet, bedpan, or urinal?: Total Help from another person bathing (including washing, rinsing, drying)?: Total Help from another person to put on and taking off regular upper body clothing?: Total Help from another person to put on and taking off regular lower body clothing?: Total 6 Click Score: 7   End of Session Equipment Utilized During Treatment: Rolling walker (2 wheels) Nurse Communication: Other (comment) (present in room)  Activity Tolerance: Patient limited by pain Patient left: in bed;with call bell/phone within reach;with bed alarm set  OT Visit Diagnosis: Unsteadiness on feet (R26.81);Other abnormalities of gait and mobility (R26.89);Pain                Time: 2130-8657 OT Time Calculation (min): 17 min Charges:  OT General Charges $OT  Visit: 1 Visit OT Evaluation $OT Eval Moderate Complexity: 1 Mod  Artur Winningham OTR/L, MS Acute Rehabilitation Department Office# [phone]   Selinda Flavin 12/25/2022, 4:45 PM

## 2022-12-25 NOTE — Progress Notes (Signed)
PROGRESS NOTE  [name]  DOB: 1976-03-11  PCP: Ollen Bowl, MD WJX:[id number]  DOA: 12/12/2022  LOS: 12 days  Hospital Day: 14  Brief narrative: [name] is a 47 y.o. female with PMH significant for Crohn's disease with previous bowel obstruction s/p subtotal colectomy 2021, colonic stricture, IDA, breast cancer s/p mastectomy 2022 currently not on treatment. Recently hospitalized 8/10-8/15/2024 for bowel obstruction seen by GI and general surgery. Underwent EGD and flexible sigmoidoscopy and discharged on steroids.  9/3, patient presented to the ED with abdominal distention, pain, nausea, vomiting CT abd/pelvis showed markedly dilated distal small bowel consistent with chronic obstruction. No changes since prior study.  Patient was admitted to [facility] with a plan of GI consultation.  9/4, in the morning, within few hours of admission, patient was found pulseless on the floor in the bathroom in ED, estimated downtime about 5 minutes. ACLS was initiated. Concern for possible torsades de pointes and given IV mag, defibrillated.  ROSC achieved in about 5 minutes. She was intubated. She had copious vomitus in the ETT.  Notably, she had I-stat peri-code which showed hemoglobin of 5.5 down from 10 overnight.  Admitted to ICU In the ICU, patient had right femoral CVC placed.  Given 3 minutes of PRBC, 3 units of FFP.  Patient was started on vasopressors  9/5, underwent exploratory laparotomy.  Noted to have internal hernia with associated small bowel ischemia.  Underwent bowel resection. 9/7, back to OR for ostomy placement 9/8, weaned off Levophed 9/10, extubated 9/10, found to have PE in CT angio chest.  Started on heparin drip which was subsequently stopped the next day because of drop in hemoglobin 9/12, mental status improved, became more interactive.  However failed swallow eval. 9/16, transferred out to [facility]  Subjective: Patient was seen and examined this morning.   [name].  Lying on bed.  Alert, awake, oriented.  Significant difficulty with hoarseness of voice but able to move lips to answer questions.  Daughter at bedside. Chart reviewed.  Continues to have persistent fever over 101 Blood pressure in 80s this morning  Labs from this morning with WBC count down to 24,000, hemoglobin down to 6.7, creatinine down to 1.41, albumin less than 1.5  Assessment and plan: Internal hernia with bowel ischemia  s/p ex lap with small bowel resection -9/5 Dr. Hillery Hunter s/p ex lap, ileostomy, abdominal closure 9/7 Dr. Hillery Hunter H/o Crohn's disease s/p prior subtotal colectomy General Surgery following.  Ostomy care per wound nurse.  Short gut syndrome Currently only has 100 cm of small bowel left.  High ostomy output.   TPN dependent To slow down ostomy output, patient is currently on Imodium, iron, FiberCon, Imodium Monitor output.  May need intermittent IV fluid on top of TPN  Dysphagia Extubated 9/10.  Seen by SLP since then.  Has failed swallow eval Not on tube feeding to avoid worsening ostomy output  Sepsis due to presumed intra-abdominal source Has persistent fever. Noted a plan from general surgery to obtain CT abdomen pelvis Currently on IV Zosyn Recent Labs  Lab 12/23/22 0520 12/23/22 2206 12/24/22 0408 12/25/22 0012 12/25/22 1212  WBC 29.7* 28.3* 27.4* 23.6* 25.7*   Pulmonary embolism Left lower lobe segmental Holding heparin given persistent intermittent bleeding from surgical site, worsening anemia Continue SCD boots  AKI improving, likely ATN in the setting of severe sepsis/septic shock Recent Labs    12/17/22 0412 12/17/22 1729 12/18/22 0705 12/19/22 0502 12/20/22 0347 12/21/22 1027 12/22/22 0445 12/23/22  6213 12/24/22 0408 12/25/22 0334  BUN 28* 27* 31* 39* 45* 51* 53* 57* 52* 51*  CREATININE 1.94* 1.86* 1.80* 1.71* 1.67* 1.40* 1.49* 1.61* 1.48* 1.41*   Acute on chronic anemia Hemoglobin trend  as below.   Received a total of 7 units of PRBC so far. Recent Labs    12/27/21 1029 05/17/22 0940 11/20/22 0252 11/21/22 0308 12/23/22 1717 12/23/22 2206 12/24/22 0408 12/25/22 0012 12/25/22 1212  HGB  --    < > 8.9*   < > 10.6* 9.3* 8.6* 6.7* 7.1*  MCV  --    < > 79.2*   < >  --  87.9 88.5 91.2 90.5  VITAMINB12  --   --  1,244*  --   --   --   --   --   --   FOLATE  --   --  16.2  --   --   --   --   --   --   FERRITIN 50.7  --  160  --   --   --   --   --   --   TIBC 246.4*  --  118*  --   --   --   --   --   --   IRON 65  --  22*  --   --   --   --   --   --   RETICCTPCT  --   --  0.9  --   --   --   --   --   --    < > = values in this interval not displayed.   Hyperglycemia: A1c 5.7 on 9//24 Continue SSI/Accu-Cheks Recent Labs  Lab 12/24/22 1953 12/24/22 2345 12/25/22 0359 12/25/22 0839 12/25/22 1249  GLUCAP 117* 96 121* 123* 106*    Recurrent breast cancer: Follows up with oncology Dr. Pamelia Hoit   Mobility: Encourage ambulation once hemodynamically stabilizes  Goals of care   Code Status: Full Code     DVT prophylaxis:  Place and maintain sequential compression device Start: 12/15/22 1031 SCDs Start: 12/13/22 0413   Antimicrobials: IV Zosyn.  Completed course of IV vancomycin Fluid: TPN Consultants: General surgery Family Communication: Daughter at bedside  Status: Inpatient Level of care:  Stepdown   Patient is from: Home Needs to continue in-hospital care: Remains hypotensive, tachycardic, has significant output from ostomy Anticipated d/c to: Pending clinical course   Diet:  Diet Order             Diet NPO time specified Except for: Other (See Comments)  Diet effective now                   Scheduled Meds:  sodium chloride   Intravenous Once   acetaminophen (TYLENOL) oral liquid 160 mg/5 mL  1,000 mg Per Tube TID   Chlorhexidine Gluconate Cloth  6 each Topical Q2200   ferrous sulfate  300 mg Per Tube BID   gabapentin  200 mg Oral  Q8H   insulin aspart  0-9 Units Subcutaneous 4 times per day   loperamide  4 mg Oral BID   metoprolol tartrate  25 mg Oral BID   oxyCODONE  5 mg Per Tube Q6H   pantoprazole  40 mg Oral Q1200   polycarbophil  625 mg Oral BID   sodium chloride flush  10-40 mL Intracatheter Q12H    PRN meds: albuterol, artificial tears, fentaNYL (SUBLIMAZE) injection, labetalol, loperamide, LORazepam, [DISCONTINUED] ondansetron **OR** ondansetron (  ZOFRAN) IV, mouth rinse, oxyCODONE, silver nitrate applicators, sodium chloride flush   Infusions:   piperacillin-tazobactam (ZOSYN)  IV Stopped (12/25/22 1144)   TPN CYCLIC-ADULT (ION)      Antimicrobials: Anti-infectives (From admission, onward)    Start     Dose/Rate Route Frequency Ordered Stop   12/23/22 2000  vancomycin (VANCOREADY) IVPB 1250 mg/250 mL  Status:  Discontinued        1,250 mg 166.7 mL/hr over 90 Minutes Intravenous Every 36 hours 12/22/22 0612 12/22/22 1103   12/22/22 0645  vancomycin (VANCOREADY) IVPB 1250 mg/250 mL        1,250 mg 166.7 mL/hr over 90 Minutes Intravenous  Once 12/22/22 0551 12/22/22 0805   12/22/22 0600  piperacillin-tazobactam (ZOSYN) IVPB 3.375 g        3.375 g 12.5 mL/hr over 240 Minutes Intravenous Every 8 hours 12/22/22 0551     12/13/22 1000  piperacillin-tazobactam (ZOSYN) IVPB 3.375 g  Status:  Discontinued        3.375 g 12.5 mL/hr over 240 Minutes Intravenous Every 8 hours 12/13/22 0833 12/20/22 1333       Objective: Vitals:   12/25/22 1400 12/25/22 1500  BP: 90/71 114/82  Pulse: (!) 128 (!) 124  Resp: 19 19  Temp: 99.9 F (37.7 C) 99.9 F (37.7 C)  SpO2: 100% 100%    Intake/Output Summary (Last 24 hours) at 12/25/2022 1504 Last data filed at 12/25/2022 1400 Gross per 24 hour  Intake 4460.54 ml  Output 4025 ml  Net 435.54 ml   Filed Weights   12/23/22 0704 12/24/22 0425 12/25/22 0500  Weight: 60.9 kg 64.7 kg 68.3 kg   Weight change: 7.4 kg Body mass index is 25.85 kg/m.   Physical  Exam: General exam: Pleasant, [name] Skin: No rashes, lesions or ulcers. HEENT: Atraumatic, normocephalic, no obvious bleeding.  Dobbhoff tube remains clamped Lungs: Clear to auscultation bilaterally CVS: Sinus tachycardia, regular rhythm, no murmur GI/Abd soft, positive tenderness present, ostomy bag CNS: Alert, awake, oriented x 3 Psychiatry: Sad affect Extremities: No pedal edema, no calf tenderness  Data Review: I have personally reviewed the laboratory data and studies available.  F/u labs ordered Unresulted Labs (From admission, onward)     Start     Ordered   12/26/22 0500  Basic metabolic panel  Tomorrow morning,   R       Question:  Specimen collection method  Answer:  Unit=Unit collect   12/25/22 0807   12/26/22 0500  Magnesium  Tomorrow morning,   R       Question:  Specimen collection method  Answer:  Unit=Unit collect   12/25/22 0807   12/26/22 0500  Phosphorus  Tomorrow morning,   R       Question:  Specimen collection method  Answer:  Unit=Unit collect   12/25/22 0807   12/25/22 0600  Comprehensive metabolic panel  Every Mon,Thu,   R     Question:  Specimen collection method  Answer:  Unit=Unit collect   12/21/22 0846   12/22/22 0544  Culture, Respiratory w Gram Stain  ONCE - URGENT,   URGENT        12/22/22 0544   12/20/22 0500  CBC  Daily,   R     Comments: While on heparin drip   Question:  Specimen collection method  Answer:  Unit=Unit collect   12/19/22 1720   12/18/22 0500  Magnesium  (Standard TPN Labs (all labs to be drawn at 0500))  Every Mon,Thu (0500),   R     Question:  Specimen collection method  Answer:  Unit=Unit collect   12/17/22 1152   12/18/22 0500  Phosphorus  (Standard TPN Labs (all labs to be drawn at 0500))  Every Mon,Thu (0500),   R     Question:  Specimen collection method  Answer:  Unit=Unit collect   12/17/22 1152   12/14/22 0000  Prealbumin  Weekly,   R      12/13/22 1839            Total time  spent in review of labs and imaging, patient evaluation, formulation of plan, documentation and communication with family: 55 minutes  Signed, Lorin Glass, MD [facility] 12/25/2022

## 2022-12-25 NOTE — Telephone Encounter (Signed)
Please see notes below.

## 2022-12-25 NOTE — Progress Notes (Signed)
OT Cancellation Note  Patient Details Name: [name] MRN: [id number] DOB: 22-Jul-1975   Cancelled Treatment:    Reason Eval/Treat Not Completed: Medical issues which prohibited therapy Patient continues to have bleeding from abdominal wound per nurse. Therapy to hold until bleeding is under control. Please re order OT when patient is medically ready.  Rosalio Loud, MS Acute Rehabilitation Department Office# [phone]  12/25/2022, 8:27 AM

## 2022-12-26 ENCOUNTER — Inpatient Hospital Stay (HOSPITAL_COMMUNITY): Payer: Managed Care, Other (non HMO)

## 2022-12-26 LAB — BASIC METABOLIC PANEL
Anion gap: 9 (ref 5–15)
BUN: 57 mg/dL — ABNORMAL HIGH (ref 6–20)
CO2: 21 mmol/L — ABNORMAL LOW (ref 22–32)
Calcium: 7.7 mg/dL — ABNORMAL LOW (ref 8.9–10.3)
Chloride: 104 mmol/L (ref 98–111)
Creatinine, Ser: 1.76 mg/dL — ABNORMAL HIGH (ref 0.44–1.00)
GFR, Estimated: 36 mL/min — ABNORMAL LOW (ref >60)
Glucose, Bld: 132 mg/dL — ABNORMAL HIGH (ref 70–99)
Potassium: 4.6 mmol/L (ref 3.5–5.1)
Sodium: 134 mmol/L — ABNORMAL LOW (ref 135–145)

## 2022-12-26 LAB — PREPARE RBC (CROSSMATCH)

## 2022-12-26 LAB — CBC
HCT: 20.7 % — ABNORMAL LOW (ref 36.0–46.0)
Hemoglobin: 6.9 g/dL — CL (ref 12.0–15.0)
MCH: 30 pg (ref 26.0–34.0)
MCHC: 33.3 g/dL (ref 30.0–36.0)
MCV: 90 fL (ref 80.0–100.0)
Platelets: 252 10*3/uL (ref 150–400)
RBC: 2.3 MIL/uL — ABNORMAL LOW (ref 3.87–5.11)
RDW: 16.2 % — ABNORMAL HIGH (ref 11.5–15.5)
WBC: 27.4 10*3/uL — ABNORMAL HIGH (ref 4.0–10.5)
nRBC: 0 % (ref 0.0–0.2)

## 2022-12-26 LAB — GLUCOSE, CAPILLARY
Glucose-Capillary: 133 mg/dL — ABNORMAL HIGH (ref 70–99)
Glucose-Capillary: 125 mg/dL — ABNORMAL HIGH (ref 70–99)
Glucose-Capillary: 78 mg/dL (ref 70–99)
Glucose-Capillary: 115 mg/dL — ABNORMAL HIGH (ref 70–99)

## 2022-12-26 LAB — MAGNESIUM: Magnesium: 2.3 mg/dL (ref 1.7–2.4)

## 2022-12-26 LAB — PHOSPHORUS: Phosphorus: 3.9 mg/dL (ref 2.5–4.6)

## 2022-12-26 MED ORDER — TRAVASOL 10 % IV SOLN
INTRAVENOUS | Status: AC
  Filled 2022-12-26: qty 940.8

## 2022-12-26 MED ORDER — ACETAMINOPHEN 160 MG/5ML PO SOLN
650.0000 mg | Freq: Four times a day (QID) | ORAL | Status: AC | PRN
  Administered 2022-12-26 – 2022-12-27 (×2): 650 mg
  Filled 2022-12-26 (×2): qty 20.3

## 2022-12-26 MED ORDER — CARMEX CLASSIC LIP BALM EX OINT
TOPICAL_OINTMENT | CUTANEOUS | Status: DC | PRN
  Filled 2022-12-26 (×2): qty 10

## 2022-12-26 MED ORDER — SODIUM CHLORIDE 0.9% IV SOLUTION: Freq: Once | INTRAVENOUS | Status: AC

## 2022-12-26 MED ORDER — SODIUM CHLORIDE 0.9 % IV SOLN: INTRAVENOUS | Status: DC

## 2022-12-26 MED ORDER — LIDOCAINE HCL 1 % IJ SOLN
INTRAMUSCULAR | Status: AC
  Filled 2022-12-26: qty 20

## 2022-12-26 NOTE — Progress Notes (Signed)
10 Days Post-Op  Subjective: CC: Seen with RN.  No further saturation of midline wound since we changed dressing yesterday.  Patient denies any abdominal pain this morning - she is on scheduled Oxy. No nausea or vomiting. Having ostomy output. Currently NPO per SLP and TF's held.  Febrile to 100.8 this am on scheduled Tylenol. Tachycardic in the 130's - on scheduled metoprolol, 25 mg BID. BP has improved with last pressure of 102/69 - not on pressors.  Hgb 6.9. Getting 1U PRBC.  WBC 27.4 (25.7). Cr 1.76 (1.41).    Objective: Vital signs in last 24 hours: Temp:  [98.2 F (36.8 C)-100.8 F (38.2 C)] 100.8 F (38.2 C) (09/17 0800) Pulse Rate:  [114-147] 138 (09/17 0706) Resp:  [17-29] 23 (09/17 0706) BP: (84-121)/(51-82) 102/69 (09/17 0706) SpO2:  [98 %-100 %] 100 % (09/17 0500) Weight:  [68.3 kg] 68.3 kg (09/17 0500) Last BM Date : 12/25/22  Intake/Output from previous day: 09/16 0701 - 09/17 0700 In: 3076.5 [I.V.:1774.4; Blood:452; NG/GT:200; IV Piggyback:650.2] Out: 5100 [Urine:2900; Stool:2200] Intake/Output this shift: Total I/O In: -  Out: 850 [Stool:850]  PE: Gen:  Alert, pleasant Card:  Tachycardic Pulm: Rate and effort normal Abd: Soft, mild distension, NT on exam.  No rigidity or guarding. Ostomy pink, budded and viable with green semi-liquid stool in ostomy bag. Midline wound appears overall stable as noted below - no signs of active bleeding.       Lab Results:  Recent Labs    12/25/22 1212 12/26/22 0500  WBC 25.7* 27.4*  HGB 7.1* 6.9*  HCT 21.0* 20.7*  PLT 178 252   BMET Recent Labs    12/25/22 0334 12/26/22 0500  NA 138 134*  K 4.5 4.6  CL 107 104  CO2 22 21*  GLUCOSE 136* 132*  BUN 51* 57*  CREATININE 1.41* 1.76*  CALCIUM 7.6* 7.7*   PT/INR Recent Labs    12/24/22 0408  LABPROT 16.8*  INR 1.3*   CMP     Component Value Date/Time   NA 134 (L) 12/26/2022 0500   NA 135 01/21/2020 1056   K 4.6 12/26/2022 0500   CL 104  12/26/2022 0500   CO2 21 (L) 12/26/2022 0500   GLUCOSE 132 (H) 12/26/2022 0500   BUN 57 (H) 12/26/2022 0500   BUN 12 01/21/2020 1056   CREATININE 1.76 (H) 12/26/2022 0500   CREATININE 0.91 09/21/2022 1141   CALCIUM 7.7 (L) 12/26/2022 0500   PROT 3.8 (L) 12/25/2022 0334   ALBUMIN <1.5 (L) 12/25/2022 0334   AST 16 12/25/2022 0334   AST 18 09/21/2022 1141   ALT 12 12/25/2022 0334   ALT 12 09/21/2022 1141   ALKPHOS 69 12/25/2022 0334   BILITOT 1.0 12/25/2022 0334   BILITOT 0.4 09/21/2022 1141   GFRNONAA 36 (L) 12/26/2022 0500   GFRNONAA >60 09/21/2022 1141   GFRAA 108 01/21/2020 1056   Lipase     Component Value Date/Time   LIPASE 762 (H) 12/21/2022 0305    Studies/Results: CT ABDOMEN PELVIS WO CONTRAST  Result Date: 12/25/2022 CLINICAL DATA:  Postoperative abdominal pain, history of breast cancer and Crohn disease EXAM: CT ABDOMEN AND PELVIS WITHOUT CONTRAST TECHNIQUE: Multidetector CT imaging of the abdomen and pelvis was performed following the standard protocol without IV contrast. Unenhanced CT was performed per clinician order. Lack of IV contrast limits sensitivity and specificity, especially for evaluation of abdominal/pelvic solid viscera. RADIATION DOSE REDUCTION: This exam was performed according to the departmental dose-optimization program  which includes automated exposure control, adjustment of the mA and/or kV according to patient size and/or use of iterative reconstruction technique. COMPARISON:  12/13/2022, 12/23/2022 FINDINGS: Lower chest: There are bilateral pleural effusions and bibasilar atelectasis, right greater than left. No evidence of pneumothorax. Hepatobiliary: Gallbladder is decompressed. Unremarkable unenhanced appearance of the liver. Pancreas: Unremarkable unenhanced appearance. Spleen: Unremarkable unenhanced appearance. Adrenals/Urinary Tract: No urinary tract calculi. Foley catheter decompresses the bladder. The adrenals are unremarkable. Stomach/Bowel:  Postsurgical changes from subtotal colectomy, small-bowel resection, and left lower quadrant ileostomy. Oral contrast is seen throughout the stomach and small bowel to the level of the ileostomy. Gastrojejunostomy tube identified, tip extending into the proximal jejunum just beyond the ligament of Treitz. Vascular/Lymphatic: No significant vascular findings on this unenhanced exam. No pathologic adenopathy. Multiple subcentimeter mesenteric lymph nodes are likely reactive. Reproductive: Stable calcified uterine fibroid.  No adnexal masses. Other: Large volume ascites throughout the abdomen and pelvis. Postsurgical changes are seen from midline laparotomy, with surgical packing material within the laparotomy incision site. There is a small amount of pneumoperitoneum within the upper abdomen, with superimposed gas fluid level. This is nonspecific given recent laparotomy. No evidence of abdominal wall hernia. Musculoskeletal: Diffuse subcutaneous edema. No acute or destructive bony abnormalities. Reconstructed images demonstrate no additional findings. IMPRESSION: 1. Postsurgical changes from subtotal colectomy, small-bowel resection, and left lower quadrant ileostomy. No evidence of bowel obstruction, as oral contrast is seen throughout the stomach and small bowel to the level of the ileostomy. 2. Postsurgical changes from midline laparotomy, with a small amount of pneumoperitoneum within the central upper abdomen. This is nonspecific given recent surgical intervention. 3. Large volume ascites. 4. Bilateral pleural effusions with underlying compressive atelectasis, right greater than left. 5. Diffuse subcutaneous edema. These results were called by telephone at the time of interpretation on 12/25/2022 at 457 pm to provider Chevis Pretty, who verbally acknowledged these results. Electronically Signed   By: Sharlet Salina M.D.   On: 12/25/2022 17:03    Anti-infectives: Anti-infectives (From admission, onward)    Start      Dose/Rate Route Frequency Ordered Stop   12/23/22 2000  vancomycin (VANCOREADY) IVPB 1250 mg/250 mL  Status:  Discontinued        1,250 mg 166.7 mL/hr over 90 Minutes Intravenous Every 36 hours 12/22/22 0612 12/22/22 1103   12/22/22 0645  vancomycin (VANCOREADY) IVPB 1250 mg/250 mL        1,250 mg 166.7 mL/hr over 90 Minutes Intravenous  Once 12/22/22 0551 12/22/22 0805   12/22/22 0600  piperacillin-tazobactam (ZOSYN) IVPB 3.375 g        3.375 g 12.5 mL/hr over 240 Minutes Intravenous Every 8 hours 12/22/22 0551     12/13/22 1000  piperacillin-tazobactam (ZOSYN) IVPB 3.375 g  Status:  Discontinued        3.375 g 12.5 mL/hr over 240 Minutes Intravenous Every 8 hours 12/13/22 9528 12/20/22 1333        Assessment/Plan Hx Crohn's disease s/p subtotal colectomy  Internal hernia with bowel ischemia - POD 12 exploratory laparotomy with small bowel resection 9/5 Dr. Hillery Hunter - POD 10 s/p exploratory laparotomy, ileostomy, abdominal closure 9/7 Dr. 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 Highly likely to have high ostomy output  - Cont TPN. Will likely need TPN at d/c - Imodium 2mg  BID, Iron 325mg  BID, Fibercon 625mg  BID started 9/11 to slow ileostomy output down.  Increased loperamide from 2 mg to 4 twice  daily 9/14.  Monitor volume of ostomy output over the next 24 hours to determine if we need to increase in loperamide to QID. May need intermittent fluid replacement for high ileostomy output.  - TF's currently held. If no planned procedures today by IR, okay with starting trickle TF's - NPO per SLP. Follow for recommendations  - WOCN following for new ostomy. Plan ostomy clinic referral at d/c - Midline wound with hx of bleeding leading > no signs of active bleeding currently. Will review picture with attending and discuss starting wound vac. - CT A/P reviewed with attending. No indication for return to the OR. Will consult IR  for aspiration/drainage and cx of ascities. Cont abx.  - PT/OT - Updated patients daughter over speaker phone while I was in the room.   ID - Zosyn 9/4>>9/11. 9/13 >>  FEN - NPO per SLP. TF's held currently. Consider trickle TF's if no procedures planned today. TPN VTE - SCDs, HEP GTT held for bleeding/ anemia.  Foley - Out overnight. I/O x 1 with per report. Bladder scan in 6 hours if unable to void - may need foley replaced.    --per CCM-- PE - Noted on CTA 9/10. LE [place] negative for DVT. Heparin gtt currently on hold. Agree with CCM that we may need to consider IVC temporary filter at some point if unable to restart heparin gtt Shock - off pressors VDRF - extubated  Recent cardiac arrest 9/3 AKI - Cr stable at 1.74 today. Trend.  Anemia - Transfuse to keep hemoglobin above 7. Getting 1U PRBC today. Consider IV iron supplementation - she has short gut so worried she will not absorb iron well orally. Hx metastatic breast cancer   LOS: 13 days    Jacinto Halim , [facility] 12/26/2022, 10:13 AM Please see Amion for pager number during day hours 7:00am-4:30pm

## 2022-12-26 NOTE — Progress Notes (Signed)
PHARMACY - TOTAL PARENTERAL NUTRITION CONSULT NOTE   Indication: Short bowel syndrome  Patient Measurements: Height: 5\' 4"  (162.6 cm) Weight: 68.3 kg (150 lb 9.2 oz) IBW/kg (Calculated) : 54.7   Body mass index is 25.85 kg/m. Usual Weight: 46.3 kg from 8/28  Assessment: 47 y.o. female with PMH of recurrent breast cancer not currently on treatment, Crohn's disease s/p subtotal colectomy in 2021, recurrent obstructions, colonic stricture, recent hospitalization 8/10-8/15/2024 for obstruction who presented on 12/12/2022 with abdominal pain, nausea, vomiting diarrhea.    9/4 Admit; found down in ED bathroom with no pulse, CPR > intubated 9/5 s/p ex-lap, found to have internal hernia with small bowel ischemia, only 105 cm of small bowel remaining OR 9/7 for closure, ostomy creation 9/8 pharmacy consulted to manage TPN 9/15 Surgery requesting transition to cyclic TPN  Glucose / Insulin: no Hx DM - steroids PTA for Crohn's disease; have been weaned off as of 9/13 - CBGs goal < 180. Range: 78-132. No SSI required/24 hrs Electrolytes: Na slightly low. All others WNL, including CorrCa (9.6) -Bicarb slightly low Renal: Baseline SCr <1. AKI s/p arrest, SCr/BUN up today Hepatic: LFT, T.bili WNL. Albumin remains low  - TG (9/11) normalized after stopping propofol I/O: I/O Net: Positive -mIVF: none -UOP: 2900 mL, ileostomy: 2200 mL - getting fiber, iron, loperamide per tube GI Imaging:  - CTA 9/10 shows new PE, but also abdominal ascites - CT Abd: Large volume ascites GI Surgeries / Procedures:  - OR 9/5 ex lap, internal hernia w/ small bowel ischemia, SBR & celiotomy; > 105 cm small bowel remaining; - OR 9/7 for closure, ostomy creation  Central access: triple lumen CVC R subclavian 9/4, triple lumen CVC R femoral 9/4;  PAC 05/02/22 TPN start date: 12/17/22  Nutritional Goals: TPN provides 95 g protein and 1737 kcal per day -Goal rate for continuous was 70 mL/hr  RD Assessment: Estimated  Needs Total Energy Estimated Needs: 1600-1800 Total Protein Estimated Needs: 85-95 grams Total Fluid Estimated Needs: 1.6L/day  Current Nutrition:  NPO TPN   Plan: Advance TPN cycle to 16 hr cycle starting at 18:00 tonight Anticipate prolonged/indefinite TPN dependency Max glucose infusion rate 4.1 mg/kg/min on 16-hr cycle (will be 5.91 on 12-hr cycle) Electrolytes in TPN: Increase Na, Reduce K Na - 80 mEq/L K - 55 mEq/L Ca - 5 mEq/L Mg - 10 mEq/L Phos - 15 mmol/L Cl:Ac ratio - 1:2 Add standard MVI and trace elements to TPN Chromium on hold d/t critical shortage Resume CBG checks 4x daily while cycling TPN On TPN Off TPN 1-2 hr after completing ramp-up 1 hr after TPN stopped MIVF per primary Monitor TPN labs on Mon/Thurs. Recheck electrolytes with AM labs tomorrow.   Cindi Carbon, PharmD 12/26/22 7:42 AM

## 2022-12-26 NOTE — Progress Notes (Signed)
OT Cancellation Note  Patient Details Name: [name] MRN: [id number] DOB: [birth year]   Cancelled Treatment:    Reason Eval/Treat Not Completed: Medical issues which prohibited therapy Nurse asking therapy to hold at this time as patient started bleeding again. OT to continue to follow and check back when patient is appropriate.  Rosalio Loud, MS Acute Rehabilitation Department Office# [phone]  12/26/2022, 1:46 PM

## 2022-12-26 NOTE — Progress Notes (Signed)
Chaplain engaged in a follow-up visit with [place]. Chaplain celebrated her success of sitting on the side of the bed today. [name] shared how significant it was for her to see outside her window. She noted that it has been a long time since she has been able to be outside. Chaplain affirmed her desire and offered encouragement. Chaplain will continue to check-in and build rapport.     12/26/22 1600  Spiritual Encounters  Type of Visit Follow up  Care provided to: Pt and family

## 2022-12-26 NOTE — Plan of Care (Signed)
  Problem: Fluid Volume: Goal: Ability to maintain a balanced intake and output will improve Outcome: Progressing   Problem: Health Behavior/Discharge Planning: Goal: Ability to manage health-related needs will improve Outcome: Progressing   Problem: Metabolic: Goal: Ability to maintain appropriate glucose levels will improve Outcome: Progressing   Problem: Skin Integrity: Goal: Risk for impaired skin integrity will decrease Outcome: Progressing   Problem: Education: Goal: Knowledge of General Education information will improve Description: Including pain rating scale, medication(s)/side effects and non-pharmacologic comfort measures Outcome: Progressing

## 2022-12-26 NOTE — Procedures (Signed)
PROCEDURE SUMMARY:  Successful image-guided paracentesis from the right lower abdomen.  Yielded 500 mL of clear yellow fluid.  No immediate complications.  EBL = trace. Patient tolerated well.   Specimen was sent for labs.  Please see imaging section of Epic for full dictation.   Kennieth Francois PA-C 12/26/2022 3:57 PM

## 2022-12-26 NOTE — Progress Notes (Signed)
PROGRESS NOTE  [name]  DOB: Jul 19, 1975  PCP: Ollen Bowl, MD WUJ:[id number]  DOA: 12/12/2022  LOS: 13 days  Hospital Day: 15  Brief narrative: [name] is a 47 y.o. female with PMH significant for Crohn's disease with previous bowel obstruction s/p subtotal colectomy 2021, colonic stricture, IDA, breast cancer s/p mastectomy 2022 currently not on treatment. Recently hospitalized 8/10-8/15/2024 for bowel obstruction seen by GI and general surgery. Underwent EGD and flexible sigmoidoscopy and discharged on steroids.  9/3, patient presented to the ED with abdominal distention, pain, nausea, vomiting CT abd/pelvis showed markedly dilated distal small bowel consistent with chronic obstruction. No changes since prior study.  Patient was admitted to [facility] with a plan of GI consultation.  9/4, in the morning, within few hours of admission, patient was found pulseless on the floor in the bathroom in ED, estimated downtime about 5 minutes. ACLS was initiated. Concern for possible torsades de pointes and given IV mag, defibrillated.  ROSC achieved in about 5 minutes. She was intubated. She had copious vomitus in the ETT.  Notably, she had I-stat peri-code which showed hemoglobin of 5.5 down from 10 overnight.  Admitted to ICU In the ICU, patient had right femoral CVC placed.  Given 3 minutes of PRBC, 3 units of FFP.  Patient was started on vasopressors  9/5, underwent exploratory laparotomy.  Noted to have internal hernia with associated small bowel ischemia.  Underwent bowel resection. 9/7, back to OR for ostomy placement 9/8, weaned off Levophed 9/10, extubated 9/10, found to have PE in CT angio chest.  Started on heparin drip which was subsequently stopped the next day because of drop in hemoglobin 9/12, mental status improved, became more interactive.  However failed swallow eval. 9/16, transferred out to [facility]  Subjective: Patient was seen and examined this afternoon.    Lying down on bed.  Not in distress.   Remains tachycardic, blood pressure low normal range. Hemoglobin was low this morning and received 1 unit of PRBC transfusion.  Assessment and plan: Internal hernia with bowel ischemia  s/p ex lap with small bowel resection -9/5 Dr. Hillery Hunter s/p ex lap, ileostomy, abdominal closure 9/7 Dr. Hillery Hunter H/o Crohn's disease s/p prior subtotal colectomy General Surgery following.   CT abdomen 9/16 shows peritoneal fluid collection.  IR to tap today.  Short gut syndrome Currently only has 100 cm of small bowel left.  High ostomy output.   TPN dependent To slow down ostomy output, patient is currently on Imodium, iron, FiberCon, Imodium Given significant output, tachycardia, hypotension and AKI-I will add NS at 100 mL/h .  Dysphagia Extubated 9/10.  Seen by SLP since then.  Has failed swallow eval Not on tube feeding to avoid worsening ostomy output  Sepsis due to presumed intra-abdominal source Fever improving but WBC count continues to rise up. Currently on IV Zosyn Recent Labs  Lab 12/23/22 2206 12/24/22 0408 12/25/22 0012 12/25/22 1212 12/26/22 0500  WBC 28.3* 27.4* 23.6* 25.7* 27.4*   Pulmonary embolism Left lower lobe segmental Holding heparin given persistent intermittent bleeding from surgical site, worsening anemia Continue SCD boots  AKI improving, likely ATN in the setting of severe sepsis/septic shock. Gradually worsening.  Starting on IV fluid today. Recent Labs    12/17/22 1729 12/18/22 0705 12/19/22 0502 12/20/22 0347 12/21/22 1027 12/22/22 0445 12/23/22 0803 12/24/22 0408 12/25/22 0334 12/26/22 0500  BUN 27* 31* 39* 45* 51* 53* 57* 52* 51* 57*  CREATININE 1.86* 1.80* 1.71* 1.67* 1.40* 1.49* 1.61*  1.48* 1.41* 1.76*   Acute on chronic anemia Hemoglobin trend as below.  It was 6.9 today.  1 unit of PRBC transfusion given. Received a total of 8 units of PRBC so far. Recent Labs    12/27/21 1029 05/17/22 0940  11/20/22 0252 11/21/22 0308 12/23/22 2206 12/24/22 0408 12/25/22 0012 12/25/22 1212 12/26/22 0500  HGB  --    < > 8.9*   < > 9.3* 8.6* 6.7* 7.1* 6.9*  MCV  --    < > 79.2*   < > 87.9 88.5 91.2 90.5 90.0  VITAMINB12  --   --  1,244*  --   --   --   --   --   --   FOLATE  --   --  16.2  --   --   --   --   --   --   FERRITIN 50.7  --  160  --   --   --   --   --   --   TIBC 246.4*  --  118*  --   --   --   --   --   --   IRON 65  --  22*  --   --   --   --   --   --   RETICCTPCT  --   --  0.9  --   --   --   --   --   --    < > = values in this interval not displayed.   Hyperglycemia: A1c 5.7 on 9//24 Continue SSI/Accu-Cheks Recent Labs  Lab 12/25/22 1249 12/25/22 1649 12/25/22 1958 12/26/22 0755 12/26/22 1153  GLUCAP 106* 78 110* 133* 125*    Recurrent breast cancer: Follows up with oncology Dr. Pamelia Hoit   Mobility: Encourage ambulation once hemodynamically stabilizes  Goals of care   Code Status: Full Code     DVT prophylaxis:  Place and maintain sequential compression device Start: 12/15/22 1031 SCDs Start: 12/13/22 0413   Antimicrobials: IV Zosyn. Completed course of IV vancomycin Fluid: TPN Consultants: General surgery Family Communication: Daughter at bedside  Status: Inpatient Level of care:  Stepdown   Patient is from: Home Needs to continue in-hospital care: Remains hypotensive, tachycardic, has significant output from ostomy Anticipated d/c to: Pending clinical course   Diet:  Diet Order             Diet NPO time specified Except for: Other (See Comments)  Diet effective now                   Scheduled Meds:  sodium chloride   Intravenous Once   acetaminophen (TYLENOL) oral liquid 160 mg/5 mL  1,000 mg Per Tube TID   Chlorhexidine Gluconate Cloth  6 each Topical Q2200   ferrous sulfate  300 mg Per Tube BID   gabapentin  200 mg Oral Q8H   insulin aspart  0-9 Units Subcutaneous 4 times per day   loperamide  4 mg Oral BID   metoprolol  tartrate  25 mg Oral BID   oxyCODONE  5 mg Per Tube Q6H   pantoprazole  40 mg Oral Q1200   polycarbophil  625 mg Oral BID   sodium chloride flush  10-40 mL Intracatheter Q12H    PRN meds: albuterol, artificial tears, fentaNYL (SUBLIMAZE) injection, labetalol, loperamide, LORazepam, [DISCONTINUED] ondansetron **OR** ondansetron (ZOFRAN) IV, mouth rinse, oxyCODONE, silver nitrate applicators, sodium chloride flush   Infusions:   sodium chloride  piperacillin-tazobactam (ZOSYN)  IV Stopped (12/26/22 0651)   TPN CYCLIC-ADULT (ION)      Antimicrobials: Anti-infectives (From admission, onward)    Start     Dose/Rate Route Frequency Ordered Stop   12/23/22 2000  vancomycin (VANCOREADY) IVPB 1250 mg/250 mL  Status:  Discontinued        1,250 mg 166.7 mL/hr over 90 Minutes Intravenous Every 36 hours 12/22/22 0612 12/22/22 1103   12/22/22 0645  vancomycin (VANCOREADY) IVPB 1250 mg/250 mL        1,250 mg 166.7 mL/hr over 90 Minutes Intravenous  Once 12/22/22 0551 12/22/22 0805   12/22/22 0600  piperacillin-tazobactam (ZOSYN) IVPB 3.375 g        3.375 g 12.5 mL/hr over 240 Minutes Intravenous Every 8 hours 12/22/22 0551     12/13/22 1000  piperacillin-tazobactam (ZOSYN) IVPB 3.375 g  Status:  Discontinued        3.375 g 12.5 mL/hr over 240 Minutes Intravenous Every 8 hours 12/13/22 0833 12/20/22 1333       Objective: Vitals:   12/26/22 1200 12/26/22 1300  BP: 113/78 104/63  Pulse: (!) 130 (!) 127  Resp: (!) 32 20  Temp:    SpO2: 99% 98%    Intake/Output Summary (Last 24 hours) at 12/26/2022 1457 Last data filed at 12/26/2022 1400 Gross per 24 hour  Intake 2984.88 ml  Output 3600 ml  Net -615.12 ml   Filed Weights   12/24/22 0425 12/25/22 0500 12/26/22 0500  Weight: 64.7 kg 68.3 kg 68.3 kg   Weight change: 0 kg Body mass index is 25.85 kg/m.   Physical Exam: General exam: Pleasant, [name] Skin: No rashes, lesions or ulcers. HEENT:  Atraumatic, normocephalic, no obvious bleeding.  Dobbhoff tube remains clamped Lungs: Clear to auscultation bilaterally CVS: Has persistent tachycardia, regular rhythm, no murmur GI/Abd soft, positive tenderness present, ostomy bag CNS: Alert, awake, oriented x 3 Psychiatry: Sad affect Extremities: No pedal edema, no calf tenderness  Data Review: I have personally reviewed the laboratory data and studies available.  F/u labs ordered Unresulted Labs (From admission, onward)     Start     Ordered   12/25/22 0600  Comprehensive metabolic panel  Every Mon,Thu,   R     Question:  Specimen collection method  Answer:  Unit=Unit collect   12/21/22 0846   12/22/22 0544  Culture, Respiratory w Gram Stain  ONCE - URGENT,   URGENT        12/22/22 0544   12/20/22 0500  CBC  Daily,   R     Comments: While on heparin drip   Question:  Specimen collection method  Answer:  Unit=Unit collect   12/19/22 1720   12/18/22 0500  Magnesium  (Standard TPN Labs (all labs to be drawn at 0500))  Every Mon,Thu (0500),   R     Question:  Specimen collection method  Answer:  Unit=Unit collect   12/17/22 1152   12/18/22 0500  Phosphorus  (Standard TPN Labs (all labs to be drawn at 0500))  Every Mon,Thu (0500),   R     Question:  Specimen collection method  Answer:  Unit=Unit collect   12/17/22 1152   12/14/22 0000  Prealbumin  Weekly,   R      12/13/22 1839            Total time spent in review of labs and imaging, patient evaluation, formulation of plan, documentation and communication with family: 45 minutes  Signed,  Lorin Glass, MD [facility] 12/26/2022

## 2022-12-26 NOTE — Plan of Care (Signed)

## 2022-12-27 ENCOUNTER — Inpatient Hospital Stay (HOSPITAL_COMMUNITY): Payer: Managed Care, Other (non HMO)

## 2022-12-27 DIAGNOSIS — Z86711 Personal history of pulmonary embolism: Secondary | ICD-10-CM

## 2022-12-27 DIAGNOSIS — R109 Unspecified abdominal pain: Secondary | ICD-10-CM

## 2022-12-27 DIAGNOSIS — Z515 Encounter for palliative care: Secondary | ICD-10-CM

## 2022-12-27 DIAGNOSIS — K90829 Short bowel syndrome, unspecified: Secondary | ICD-10-CM | POA: Diagnosis not present

## 2022-12-27 LAB — BPAM RBC
Blood Product Expiration Date: 202409222359
Blood Product Expiration Date: 202410192359
ISSUE DATE / TIME: 202409160553
ISSUE DATE / TIME: 202409170645
Unit Type and Rh: 7300
Unit Type and Rh: 7300

## 2022-12-27 LAB — BASIC METABOLIC PANEL WITH GFR
Anion gap: 12 (ref 5–15)
BUN: 53 mg/dL — ABNORMAL HIGH (ref 6–20)
CO2: 22 mmol/L (ref 22–32)
Calcium: 7.8 mg/dL — ABNORMAL LOW (ref 8.9–10.3)
Chloride: 106 mmol/L (ref 98–111)
Creatinine, Ser: 1.47 mg/dL — ABNORMAL HIGH (ref 0.44–1.00)
GFR, Estimated: 45 mL/min — ABNORMAL LOW (ref >60)
Glucose, Bld: 134 mg/dL — ABNORMAL HIGH (ref 70–99)
Potassium: 4.2 mmol/L (ref 3.5–5.1)
Sodium: 140 mmol/L (ref 135–145)

## 2022-12-27 LAB — CBC
HCT: 24.5 % — ABNORMAL LOW (ref 36.0–46.0)
Hemoglobin: 8.2 g/dL — ABNORMAL LOW (ref 12.0–15.0)
MCH: 29.9 pg (ref 26.0–34.0)
MCHC: 33.5 g/dL (ref 30.0–36.0)
MCV: 89.4 fL (ref 80.0–100.0)
Platelets: 260 10*3/uL (ref 150–400)
RBC: 2.74 MIL/uL — ABNORMAL LOW (ref 3.87–5.11)
RDW: 16.7 % — ABNORMAL HIGH (ref 11.5–15.5)
WBC: 23.6 10*3/uL — ABNORMAL HIGH (ref 4.0–10.5)
nRBC: 0 % (ref 0.0–0.2)

## 2022-12-27 LAB — GLUCOSE, CAPILLARY
Glucose-Capillary: 138 mg/dL — ABNORMAL HIGH (ref 70–99)
Glucose-Capillary: 93 mg/dL (ref 70–99)
Glucose-Capillary: 97 mg/dL (ref 70–99)
Glucose-Capillary: 112 mg/dL — ABNORMAL HIGH (ref 70–99)

## 2022-12-27 LAB — TYPE AND SCREEN
ABO/RH(D): B POS
Antibody Screen: NEGATIVE
Unit division: 0
Unit division: 0

## 2022-12-27 LAB — PHOSPHORUS: Phosphorus: 3.8 mg/dL (ref 2.5–4.6)

## 2022-12-27 LAB — MAGNESIUM: Magnesium: 2.1 mg/dL (ref 1.7–2.4)

## 2022-12-27 MED ORDER — VITAL AF 1.2 CAL PO LIQD
1000.0000 mL | ORAL | Status: DC
  Administered 2022-12-27 – 2022-12-29 (×3): 1000 mL

## 2022-12-27 MED ORDER — VITAMIN K1 10 MG/ML IJ SOLN
2.0000 mg | INTRAVENOUS | Status: DC
  Administered 2022-12-28 – 2023-01-04 (×2): 2 mg via INTRAVENOUS
  Filled 2022-12-27 (×2): qty 0.2

## 2022-12-27 MED ORDER — NUTRISOURCE FIBER PO PACK
1.0000 | PACK | Freq: Two times a day (BID) | ORAL | Status: DC
  Administered 2022-12-27 – 2023-01-01 (×10): 1
  Filled 2022-12-27 (×10): qty 1

## 2022-12-27 MED ORDER — OXYCODONE HCL 5 MG PO TABS
5.0000 mg | ORAL_TABLET | ORAL | Status: DC | PRN
  Administered 2022-12-27 – 2022-12-28 (×5): 5 mg
  Administered 2022-12-28 – 2022-12-29 (×2): 10 mg
  Administered 2022-12-29 (×2): 5 mg
  Administered 2022-12-29 – 2023-01-01 (×11): 10 mg
  Administered 2023-01-02: 5 mg
  Administered 2023-01-02 – 2023-01-10 (×27): 10 mg
  Administered 2023-01-10: 5 mg
  Administered 2023-01-11 – 2023-01-12 (×5): 10 mg
  Filled 2022-12-27: qty 1
  Filled 2022-12-27 (×7): qty 2
  Filled 2022-12-27: qty 1
  Filled 2022-12-27 (×3): qty 2
  Filled 2022-12-27: qty 1
  Filled 2022-12-27 (×2): qty 2
  Filled 2022-12-27: qty 1
  Filled 2022-12-27 (×6): qty 2
  Filled 2022-12-27: qty 1
  Filled 2022-12-27 (×7): qty 2
  Filled 2022-12-27: qty 1
  Filled 2022-12-27 (×10): qty 2
  Filled 2022-12-27: qty 1
  Filled 2022-12-27 (×9): qty 2
  Filled 2022-12-27: qty 1
  Filled 2022-12-27 (×2): qty 2

## 2022-12-27 MED ORDER — METOPROLOL TARTRATE 25 MG PO TABS
25.0000 mg | ORAL_TABLET | Freq: Two times a day (BID) | ORAL | Status: DC
  Administered 2022-12-27 – 2022-12-29 (×5): 25 mg
  Filled 2022-12-27 (×5): qty 1

## 2022-12-27 MED ORDER — ACETAMINOPHEN 160 MG/5ML PO SOLN
1000.0000 mg | Freq: Three times a day (TID) | ORAL | Status: DC
  Administered 2022-12-27 – 2022-12-29 (×6): 1000 mg
  Filled 2022-12-27 (×6): qty 40.6

## 2022-12-27 MED ORDER — LOPERAMIDE HCL 1 MG/7.5ML PO SUSP
4.0000 mg | Freq: Four times a day (QID) | ORAL | Status: DC
  Administered 2022-12-27 – 2023-01-11 (×59): 4 mg
  Filled 2022-12-27 (×63): qty 30

## 2022-12-27 MED ORDER — CHOLECALCIFEROL 10 MCG (400 UNIT) PO TABS
400.0000 [IU] | ORAL_TABLET | Freq: Every day | ORAL | Status: DC
  Administered 2022-12-28 – 2022-12-29 (×2): 400 [IU]
  Filled 2022-12-27 (×2): qty 1

## 2022-12-27 MED ORDER — TRAVASOL 10 % IV SOLN
INTRAVENOUS | Status: AC
  Filled 2022-12-27: qty 940.8

## 2022-12-27 MED ORDER — LOPERAMIDE HCL 2 MG PO CAPS
4.0000 mg | ORAL_CAPSULE | Freq: Four times a day (QID) | ORAL | Status: DC
  Administered 2022-12-27 (×2): 4 mg via ORAL
  Filled 2022-12-27 (×2): qty 2

## 2022-12-27 MED ORDER — FAMOTIDINE 40 MG/5ML PO SUSR
20.0000 mg | Freq: Every day | ORAL | Status: DC
  Administered 2022-12-27 – 2023-01-11 (×16): 20 mg
  Filled 2022-12-27 (×17): qty 2.5

## 2022-12-27 MED ORDER — GABAPENTIN 250 MG/5ML PO SOLN
200.0000 mg | Freq: Three times a day (TID) | ORAL | Status: DC
  Administered 2022-12-27 – 2023-01-12 (×45): 200 mg
  Filled 2022-12-27 (×48): qty 4

## 2022-12-27 MED ORDER — VITAMIN E 45 MG (100 UNIT) PO CAPS
100.0000 [IU] | ORAL_CAPSULE | Freq: Every day | ORAL | Status: DC
  Administered 2022-12-28 – 2023-01-09 (×13): 100 [IU]
  Filled 2022-12-27 (×13): qty 1

## 2022-12-27 MED ORDER — CALCIUM POLYCARBOPHIL 625 MG PO TABS: 625.0000 mg | ORAL_TABLET | Freq: Two times a day (BID) | ORAL | Status: DC

## 2022-12-27 NOTE — Progress Notes (Signed)
Physical Therapy Treatment Patient Details Name: [name] MRN: [id number] DOB: 13-Jul-1975 Today's Date: 12/27/2022   History of Present Illness Patient is a 47 year old female who presented on 9/3 with abdominal distension and nausea/vomiting. 9/4 patient was found in ED bathroom with no pulse with ROSC in about 5 mins. Patient was intubated at this time. 9/5 patient underwent exploratory laparotomy that revealed internal hernia with associated small bowel ischemia with patient undergoing bowel resection. 9/7 patient returned to OR for ostomy placement. Patient was extubated on 9/10 and also found to have PE. PMH: breast cancer, Chron's disease, IDA, recent hospitalization 8/10 to 8/15 with bowel obstruction and EGD    PT Comments  The patient  ready to  mobilize with therapies.  Patient requires extra time to work through being anxious.  Patient tolerated mobilizing to sitting with max support and stood x 2 at [facility] , Stood x ~  3 minutes. Patient progressing slowly and hopes to mobilize to recliner soon.  HR 99- 154. RN aware. SPO2 >94% on RA    If plan is discharge home, recommend the following: A lot of help with walking and/or transfers;A lot of help with bathing/dressing/bathroom;Assistance with cooking/housework;Assist for transportation   Can travel by private vehicle        Equipment Recommendations  Rolling walker (2 wheels)    Recommendations for Other Services       Precautions / Restrictions Precautions Precautions: Fall Precaution Comments: abdominal precautions, wound vac, abominal binder, ostomy, nasal tube Restrictions Weight Bearing Restrictions: No     Mobility  Bed Mobility   Bed Mobility: Rolling, Sidelying to Sit, Sit to Sidelying Rolling: Max assist Sidelying to sit: Max assist, +2 for physical assistance, +2 for safety/equipment     Sit to sidelying: +2 for safety/equipment, +2 for physical assistance General bed mobility comments: with  increased time and cues for log rolling to prevent pressure on abdomen. patient needed physical A for trunk and BLE movement.    Transfers Overall transfer level: Needs assistance     Sit to Stand: Mod assist, +2 physical assistance, +2 safety/equipment           General transfer comment: stood from bed raised x 1 in STEDy and stood x 1 in stedy Transfer via Lift Equipment: Stedy  Ambulation/Gait                   Stairs             Wheelchair Mobility     Tilt Bed    Modified Rankin (Stroke Patients Only)       Balance Overall balance assessment: Needs assistance Sitting-balance support: Bilateral upper extremity supported, Feet supported Sitting balance-Leahy Scale: Fair     Standing balance support: Reliant on assistive device for balance, Bilateral upper extremity supported Standing balance-Leahy Scale: Poor                              Cognition Arousal: Alert Behavior During Therapy: Anxious                                   General Comments: patient was more participatory speaking wise. patient did have a moment of tearfulness at end of session when patient reported difficulty with rfinding someone to watch her grandchild for her daughter while she was here. patients thoughts and feelings  were heard and validated. patient continues to have near no voice.        Exercises      General Comments        Pertinent Vitals/Pain Pain Assessment Faces Pain Scale: Hurts little more Pain Location: abdomen Pain Descriptors / Indicators: Grimacing, Constant    Home Living                          Prior Function            PT Goals (current goals can now be found in the care plan section) Progress towards PT goals: Progressing toward goals    Frequency    Min 1X/week      PT Plan      Co-evaluation PT/OT/SLP Co-Evaluation/Treatment: Yes Reason for Co-Treatment: To address functional/ADL  transfers;For patient/therapist safety;Complexity of the patient's impairments (multi-system involvement) PT goals addressed during session: Mobility/safety with mobility OT goals addressed during session: ADL's and self-care      AM-PAC PT "6 Clicks" Mobility   Outcome Measure  Help needed turning from your back to your side while in a flat bed without using bedrails?: A Lot Help needed moving from lying on your back to sitting on the side of a flat bed without using bedrails?: A Lot Help needed moving to and from a bed to a chair (including a wheelchair)?: Total Help needed standing up from a chair using your arms (e.g., wheelchair or bedside chair)?: Total Help needed to walk in hospital room?: Total Help needed climbing 3-5 steps with a railing? : Total 6 Click Score: 8    End of Session   Activity Tolerance: Patient tolerated treatment well;Patient limited by pain Patient left: in bed;with call bell/phone within reach;with nursing/sitter in room;with bed alarm set Nurse Communication: Mobility status PT Visit Diagnosis: Unsteadiness on feet (R26.81);Muscle weakness (generalized) (M62.81);Pain;Difficulty in walking, not elsewhere classified (R26.2)     Time: 4010-2725 PT Time Calculation (min) (ACUTE ONLY): 35 min  Charges:    $Therapeutic Activity: 8-22 mins PT General Charges $$ ACUTE PT VISIT: 1 Visit                     Blanchard Kelch PT Acute Rehabilitation Services Office [phone] Weekend [phone]     [name] 12/27/2022, 4:56 PM

## 2022-12-27 NOTE — Progress Notes (Signed)
PROGRESS NOTE  [name]  DOB: 12-Apr-1975  PCP: Ollen Bowl, MD ZOX:[id number]  DOA: 12/12/2022  LOS: 14 days  Hospital Day: 16  Brief narrative: [name] is a 47 y.o. female with PMH significant for Crohn's disease with previous bowel obstruction s/p subtotal colectomy 2021, colonic stricture, IDA, breast cancer s/p mastectomy 2022 currently not on treatment. Recently hospitalized 8/10-8/15/2024 for bowel obstruction seen by GI and general surgery. Underwent EGD and flexible sigmoidoscopy and discharged on steroids.  9/3, patient presented to the ED with abdominal distention, pain, nausea, vomiting CT abd/pelvis showed markedly dilated distal small bowel consistent with chronic obstruction. No changes since prior study.  Patient was admitted to [facility] with a plan of GI consultation.  9/4, in the morning, within few hours of admission, patient was found pulseless on the floor in the bathroom in ED, estimated downtime about 5 minutes. ACLS was initiated. Concern for possible torsades de pointes and given IV mag, defibrillated.  ROSC achieved in about 5 minutes. She was intubated. She had copious vomitus in the ETT.  Notably, she had I-stat peri-code which showed hemoglobin of 5.5 down from 10 overnight.  Admitted to ICU In the ICU, patient had right femoral CVC placed.  Given 3 minutes of PRBC, 3 units of FFP.  Patient was started on vasopressors  9/5, underwent exploratory laparotomy.  Noted to have internal hernia with associated small bowel ischemia.  Underwent bowel resection. 9/7, back to OR for ostomy placement 9/8, weaned off Levophed 9/10, extubated 9/10, found to have PE in CT angio chest.  Started on heparin drip which was subsequently stopped the next day because of drop in hemoglobin 9/12, mental status improved, became more interactive.  However failed swallow eval. 9/16, transferred out to [facility] 9/16, CT abdomen showed peritoneal fluid collection 9/17,  underwent paracentesis, 500 cc of clear yellow fluid drained  Subjective: Patient was seen and examined this morning..   Lying down on bed.  Not in distress. Remains tachycardic, blood pressure overall improving Hemoglobin better after blood transfusion yesterday.  Creatinine better.  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 Hillery Hunter s/p ex lap, ileostomy, abdominal closure 9/7 Dr. Hillery Hunter H/o Crohn's disease s/p prior subtotal colectom Currently only has 100 cm of small bowel left.  High ostomy output.   TPN dependent To slow down ostomy output, patient is currently on Imodium, iron, FiberCon, Imodium General Surgery following.   Given significant output, tachycardia, hypotension and AKI-patient is also on NS at 100 mL/h.  Reduce to 50 mL/h today.  Dysphagia Extubated 9/10.  Seen by SLP since then.  Has failed swallow eval.  SLP to follow today again. Not on tube feeding to avoid worsening ostomy output  Sepsis due to presumed intra-abdominal source Continues to have intermittent fever.  Downtrending WBC. Currently on IV Zosyn Recent Labs  Lab 12/24/22 0408 12/25/22 0012 12/25/22 1212 12/26/22 0500 12/27/22 0359  WBC 27.4* 23.6* 25.7* 27.4* 23.6*   Pulmonary embolism Left lower lobe segmental Holding heparin given persistent intermittent bleeding from surgical site, worsening anemia Continue SCD boots  AKI Likely due to ATN related to septic shock. Improving and last 24 hours with IV fluid Recent Labs    12/18/22 0705 12/19/22 0502 12/20/22 0347 12/21/22 1027 12/22/22 0445 12/23/22 0803 12/24/22 0408 12/25/22 0334 12/26/22 0500 12/27/22 0823  BUN 31* 39* 45* 51* 53* 57* 52* 51* 57* 53*  CREATININE  1.80* 1.71* 1.67* 1.40* 1.49* 1.61* 1.48* 1.41* 1.76* 1.47*   Acute on chronic anemia Hemoglobin trend as below.  It was 6.9 today.  1 unit of PRBC transfusion given. Received a  total of 8 units of PRBC so far. Recent Labs    11/20/22 0252 11/21/22 0308 12/24/22 0408 12/25/22 0012 12/25/22 1212 12/26/22 0500 12/27/22 0359  HGB 8.9*   < > 8.6* 6.7* 7.1* 6.9* 8.2*  MCV 79.2*   < > 88.5 91.2 90.5 90.0 89.4  VITAMINB12 1,244*  --   --   --   --   --   --   FOLATE 16.2  --   --   --   --   --   --   FERRITIN 160  --   --   --   --   --   --   TIBC 118*  --   --   --   --   --   --   IRON 22*  --   --   --   --   --   --   RETICCTPCT 0.9  --   --   --   --   --   --    < > = values in this interval not displayed.   Hyperglycemia: A1c 5.7 on 9//24 Continue SSI/Accu-Cheks Recent Labs  Lab 12/26/22 0755 12/26/22 1153 12/26/22 1604 12/26/22 2131 12/27/22 0633  GLUCAP 133* 125* 78 115* 138*    Recurrent breast cancer: Follows up with oncology Dr. Pamelia Hoit   Mobility: Encourage ambulation once hemodynamically stabilizes  Goals of care   Code Status: Full Code     DVT prophylaxis:  Place and maintain sequential compression device Start: 12/15/22 1031 SCDs Start: 12/13/22 0413   Antimicrobials: IV Zosyn. Completed course of IV vancomycin Fluid: TPN, NS at 50 mL/h Consultants: General surgery Family Communication: Daughter not at bedside today  Status: Inpatient Level of care:  Stepdown   Patient is from: Home Needs to continue in-hospital care: Remains hypotensive, tachycardic, has significant output from ostomy Anticipated d/c to: Pending clinical course   Diet:  Diet Order             Diet NPO time specified Except for: Other (See Comments)  Diet effective now                   Scheduled Meds:  sodium chloride   Intravenous Once   acetaminophen (TYLENOL) oral liquid 160 mg/5 mL  1,000 mg Per Tube TID   Chlorhexidine Gluconate Cloth  6 each Topical Q2200   feeding supplement (VITAL AF 1.2 CAL)  1,000 mL Per Tube Q24H   ferrous sulfate  300 mg Per Tube BID   gabapentin  200 mg Oral Q8H   insulin aspart  0-9 Units Subcutaneous 4  times per day   loperamide  4 mg Oral QID   metoprolol tartrate  25 mg Oral BID   pantoprazole  40 mg Oral Q1200   polycarbophil  625 mg Oral BID   sodium chloride flush  10-40 mL Intracatheter Q12H    PRN meds: albuterol, artificial tears, fentaNYL (SUBLIMAZE) injection, labetalol, lip balm, LORazepam, [DISCONTINUED] ondansetron **OR** ondansetron (ZOFRAN) IV, mouth rinse, oxyCODONE, silver nitrate applicators, sodium chloride flush   Infusions:   sodium chloride Stopped (12/27/22 0345)   piperacillin-tazobactam (ZOSYN)  IV Stopped (12/27/22 1052)   TPN CYCLIC-ADULT (ION)      Antimicrobials: Anti-infectives (From admission, onward)  Start     Dose/Rate Route Frequency Ordered Stop   12/23/22 2000  vancomycin (VANCOREADY) IVPB 1250 mg/250 mL  Status:  Discontinued        1,250 mg 166.7 mL/hr over 90 Minutes Intravenous Every 36 hours 12/22/22 0612 12/22/22 1103   12/22/22 0645  vancomycin (VANCOREADY) IVPB 1250 mg/250 mL        1,250 mg 166.7 mL/hr over 90 Minutes Intravenous  Once 12/22/22 0551 12/22/22 0805   12/22/22 0600  piperacillin-tazobactam (ZOSYN) IVPB 3.375 g        3.375 g 12.5 mL/hr over 240 Minutes Intravenous Every 8 hours 12/22/22 0551     12/13/22 1000  piperacillin-tazobactam (ZOSYN) IVPB 3.375 g  Status:  Discontinued        3.375 g 12.5 mL/hr over 240 Minutes Intravenous Every 8 hours 12/13/22 0833 12/20/22 1333       Objective: Vitals:   12/27/22 0100 12/27/22 0200  BP: 109/62 120/82  Pulse: (!) 117 (!) 119  Resp: 19 (!) 21  Temp: 100 F (37.8 C) 100.2 F (37.9 C)  SpO2: 98% 100%    Intake/Output Summary (Last 24 hours) at 12/27/2022 1116 Last data filed at 12/27/2022 1107 Gross per 24 hour  Intake 3863.89 ml  Output 3300 ml  Net 563.89 ml   Filed Weights   12/25/22 0500 12/26/22 0500 12/27/22 0408  Weight: 68.3 kg 68.3 kg 61.8 kg   Weight change: -6.5 kg Body mass index is 23.39 kg/m.   Physical Exam: General exam: Pleasant,  [name].  Not in pain at the time of my evaluation Skin: No rashes, lesions or ulcers. HEENT: Atraumatic, normocephalic, no obvious bleeding.  Dobbhoff tube remains clamped Lungs: Clear to auscultation bilaterally CVS: Has persistent tachycardia, regular rhythm, no murmur GI/Abd soft, positive tenderness present, incision site care per general surgery, ostomy bag attached to drain CNS: Alert, awake, oriented x 3 Psychiatry: Sad affect Extremities: No pedal edema, no calf tenderness  Data Review: I have personally reviewed the laboratory data and studies available.  F/u labs ordered Unresulted Labs (From admission, onward)     Start     Ordered   12/28/22 0500  Vitamin A  Tomorrow morning,   R       Question:  Specimen collection method  Answer:  Unit=Unit collect   12/27/22 1103   12/28/22 0500  VITAMIN D 25 Hydroxy (Vit-D Deficiency, Fractures)  Tomorrow morning,   R       Question:  Specimen collection method  Answer:  Unit=Unit collect   12/27/22 1103   12/28/22 0500  Vitamin E  Tomorrow morning,   R       Question:  Specimen collection method  Answer:  Unit=Unit collect   12/27/22 1103   12/28/22 0500  Vitamin K1, Serum  Tomorrow morning,   R       Question:  Specimen collection method  Answer:  Unit=Unit collect   12/27/22 1103   12/27/22 0903  Fungus culture, blood  Once,   R        12/27/22 0902   12/27/22 0854  Culture, blood (Routine X 2) w Reflex to ID Panel  BLOOD CULTURE X 2,   R (with TIMED occurrences)      12/27/22 0933   12/27/22 0845  Urinalysis, Routine w reflex microscopic -Urine, Clean Catch  Once,   R       Question:  Specimen Source  Answer:  Urine, Clean Catch   12/27/22  0902   12/27/22 0844  Expectorated Sputum Assessment w Gram Stain, Rflx to Resp Cult  Once,   R        12/27/22 0902   12/25/22 0600  Comprehensive metabolic panel  Every Mon,Thu,   R     Question:  Specimen collection method  Answer:  Unit=Unit collect    12/21/22 0846   12/22/22 0544  Culture, Respiratory w Gram Stain  ONCE - URGENT,   URGENT        12/22/22 0544   12/20/22 0500  CBC  Daily,   R     Comments: While on heparin drip   Question:  Specimen collection method  Answer:  Unit=Unit collect   12/19/22 1720   12/18/22 0500  Magnesium  (Standard TPN Labs (all labs to be drawn at 0500))  Every Mon,Thu (0500),   R     Question:  Specimen collection method  Answer:  Unit=Unit collect   12/17/22 1152   12/18/22 0500  Phosphorus  (Standard TPN Labs (all labs to be drawn at 0500))  Every Mon,Thu (0500),   R     Question:  Specimen collection method  Answer:  Unit=Unit collect   12/17/22 1152   12/14/22 0000  Prealbumin  Weekly,   R      12/13/22 1839            Total time spent in review of labs and imaging, patient evaluation, formulation of plan, documentation and communication with family: 45 minutes  Signed, Lorin Glass, MD [facility] 12/27/2022

## 2022-12-27 NOTE — Progress Notes (Signed)
Speech Language Pathology Treatment: Dysphagia  Patient Details Name: [name] MRN: [id number] DOB: 1975-10-14 Today's Date: 12/27/2022 Time: 4401-0272 SLP Time Calculation (min) (ACUTE ONLY): 12 min  Assessment / Plan / Recommendation Clinical Impression  Purpose of session was to address patient's swallow function including educating her with teach back to precautions. Reviewed MBS fluoroscopy loops with patient showing her overt aspiration of secretions as well as p.o. due to decreased laryngeal elevation and closure on her MBS study 5 days prior. Patient demonstrated a large amount of secretion aspiration in addition to barium thus oral care is highly important for her at this time. Importance of MBS review was to demonstrate to patient that her aspiration may have resulted in reflexive cough, but she was unable to clear her airway. Patient continues to be aphonic but has much better lingual control with excellent articulation ability and also was able to brush her teeth and needs suction to clear water. Recommend patient brush her teeth a.m. and p.m. herself using oral suction to rinse and clear water. And continue free ice chips after mouth care and continue using her flutter valve to help her with cough strength for secretion expectoration. Patient demonstrated excellent understanding of improvement with her swallow initiation compared to prior MBS but also continued concern for pharyngeal deficits = motoric and possibly sensory.     HPI HPI: Per CCM note "47 year old female with past medical history of recurrent breast cancer not currently on treatment, Crohn's disease s/p subtotal colectomy in 2021 recurrent obstructions, colonic stricture, IDA, recent hospitalization 8/10-8/15/2024 for obstruction seen by GI and general surgery. Underwent EGD and flexible sigmoidoscopy and discharged on steroids who presented to the emergency department on 12/12/2022 with abdominal pain, nausea, vomiting  diarrhea. In the ED tachycardic, hypertensive. ED provider notes abdominal distention. She was given 2L IVF, morphine, IV PPI. Labs with WBC 10.8, Hgb 10.7, K 3.2. CT AP was ordered and showed markedly dilated distal small bowel consistent with chronic obstruction. No changes since prior study. Patient was admitted to [facility] They consulted GI for input. Unfortunately, around 0820 9/4 patient was found on the floor in the bathroom in ED pulseless. They estimate downtime around 5 minutes. ACLS was initiated. Concern for possible torsades de pointes and given IV mag, defibrillated. Coded for around 5 minutes. She was intubated. She had copious vomitus in the ETT. ROSC achieved and ICU was consulted. Notably, she had I-stat peri-code which showed hemoglobin of 5.5 down from 10 overnight."  Pt was extubated 12/18/2021.      SLP Plan  Continue with current plan of care;MBS (MBS 920)      Recommendations for follow up therapy are one component of a multi-disciplinary discharge planning process, led by the attending physician.  Recommendations may be updated based on patient status, additional functional criteria and insurance authorization.    Recommendations  Diet recommendations: Nectar-thick liquid;Dysphagia 1 (puree) (via tsp only!) Liquids provided via: Teaspoon Medication Administration: Via alternative means Supervision: Full supervision/cueing for compensatory strategies (from nursing) Compensations: Slow rate;Small sips/bites (HOB minimally reclined recommended - approx 40*)                  Oral care QID;Oral care prior to ice chip/H20   Frequent or constant Supervision/Assistance Dysphagia, pharyngeal phase (R13.13)     Continue with current plan of care;MBS (MBS 920)    Courtney Infante, MS [facility] SLP Acute Rehab Services Office [phone]  Chales Abrahams  12/27/2022, 5:08 PM

## 2022-12-27 NOTE — Progress Notes (Signed)
PMT no charge note.   Chart reviewed, appreciate chaplain providing support to family, also discussed with SLP regarding repeating MBS study on 12-29-22. Continue current mode of care. PMT available for further goals of care discussions as need arises.  No charge Rosalin Hawking MD [facility].

## 2022-12-27 NOTE — Progress Notes (Signed)
Chaplain engaged in a follow-up visit with [name]. She shared about how she pulls her strength to keep going from God. Chaplain assessed that her belief system has helped [name] cope and be resilient through her health battles. [name] also detailed some of her healthcare journey and what happened before she coded in the ED. She had not been feeling well and specifically felt a burning in her stomach which prompted her to call the ambulance.   [name] also talked about her support system which includes friends and family. She feels well supported overall. She also enjoys seeing outside of her window. Chaplain opened her blinds for her. One of her favorite seasons is fall.   Chaplain offered compassionate and supportive presence and will continue to follow-up.    12/27/22 1400  Spiritual Encounters  Type of Visit Follow up  Care provided to: Patient  Spiritual Framework  Values/beliefs Christian-centered faith  Community/Connection Family;Friend(s)  Interventions  Spiritual Care Interventions Made Established relationship of care and support;Compassionate presence;Reflective listening;Encouragement;Normalization of emotions  Intervention Outcomes  Outcomes Awareness of support;Connection to spiritual care

## 2022-12-27 NOTE — Progress Notes (Signed)
PHARMACY - TOTAL PARENTERAL NUTRITION CONSULT NOTE   Indication: Short bowel syndrome  Patient Measurements: Height: 5\' 4"  (162.6 cm) Weight: 61.8 kg (136 lb 3.9 oz) IBW/kg (Calculated) : 54.7   Body mass index is 23.39 kg/m. Usual Weight: 46.3 kg from 8/28  Assessment: 47 y.o. female with PMH of recurrent breast cancer not currently on treatment, Crohn's disease s/p subtotal colectomy in 2021, recurrent obstructions, colonic stricture, recent hospitalization 8/10-8/15/2024 for obstruction who presented on 12/12/2022 with abdominal pain, nausea, vomiting diarrhea.    9/4 Admit; found down in ED bathroom with no pulse, CPR > intubated 9/5 s/p ex-lap, found to have internal hernia with small bowel ischemia, only 105 cm of small bowel remaining OR 9/7 for closure, ostomy creation 9/8 pharmacy consulted to manage TPN 9/15 Surgery requesting transition to cyclic TPN  Glucose / Insulin: no Hx DM - steroids PTA for Crohn's disease; have been weaned off as of 9/13 - CBGs goal < 180. Range: 78-138. 3u SSI required/24 hrs Electrolytes: All WNL, including CorrCa (9.8) Renal: Baseline SCr <1. AKI s/p arrest, SCr/BUN remain elevated Hepatic: LFT, T.bili WNL. Albumin remains low  - TG (9/11) normalized after stopping propofol I/O: I/O Net: Positive -mIVF: none -UOP: 2100 mL, ileostomy: 2050 mL - getting fiber, iron, loperamide per tube GI Imaging:  - CTA 9/10 shows new PE, but also abdominal ascites - CT Abd: Large volume ascites GI Surgeries / Procedures:  - OR 9/5 ex lap, internal hernia w/ small bowel ischemia, SBR & celiotomy; > 105 cm small bowel remaining; - OR 9/7 for closure, ostomy creation  Central access: triple lumen CVC R subclavian 9/4, triple lumen CVC R femoral 9/4;  PAC 05/02/22 TPN start date: 12/17/22  Nutritional Goals: TPN provides 95 g protein and 1737 kcal per day -Goal rate for continuous was 70 mL/hr  RD Assessment: Estimated Needs Total Energy Estimated Needs:  1600-1800 Total Protein Estimated Needs: 85-95 grams Total Fluid Estimated Needs: 1.6L/day  Current Nutrition:  NPO TPN   Plan: Advance TPN cycle to 14 hr cycle starting at 18:00 tonight Anticipate prolonged/indefinite TPN dependency Max glucose infusion rate 5.22 mg/kg/min on 16-hr cycle (will be 5.91 on 12-hr cycle) Electrolytes in TPN: No change Na - 80 mEq/L K - 55 mEq/L Ca - 5 mEq/L Mg - 10 mEq/L Phos - 15 mmol/L Cl:Ac ratio - 1:2 Add standard MVI and trace elements to TPN Chromium on hold d/t critical shortage Resume CBG checks 4x daily while cycling TPN On TPN Off TPN 1-2 hr after completing ramp-up 1 hr after TPN stopped MIVF per primary Monitor TPN labs on Mon/Thurs.   Cindi Carbon, PharmD 12/27/22 10:01 AM

## 2022-12-27 NOTE — Progress Notes (Signed)
Bilateral lower extremity venous duplex has been completed. Preliminary results can be found in CV Proc through chart review.   12/27/22 9:59 AM Olen Cordial RVT

## 2022-12-27 NOTE — Progress Notes (Addendum)
Nutrition Follow-up  DOCUMENTATION CODES:   Non-severe (moderate) malnutrition in context of chronic illness  INTERVENTION:  - Plan to continue cyclic TPN. Transitioning to 14 hour cyclic feeds this evening.  Goal to eventually transition to 12 hour cyclic feeds.   - TPN management per Pharmacy  - Plan to start trickle TF today: Vital 1.2 at 58mL/hr  - Per Surgery, plan to start supplementation of fat soluble vitamins D, E, and K.  - Will order 400 units vitamin D daily and 100 units vitamin E daily per tube.  - IV Vitamin K supplementation per Surgery/Pharmacy.   - Checking vitamin A prior to supplementation.  - Will also check vitamin A, D, E, and K levels to assess need for maintenance doses or repletion doses of supplementation.  - Continue MVI in TPN.  - Recommend 500mg  vitamin C BID and 220mg  zinc once medically appropriate to promote wound healing.   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    NUTRITION DIAGNOSIS:   Moderate Malnutrition related to chronic illness (Crohn's disease; metastatic breast cancer) as evidenced by moderate fat depletion, moderate muscle depletion, percent weight loss (24% in 4.5 months). *ongoing  GOAL:   Patient will meet greater than or equal to 90% of their needs *met with TPN  MONITOR:   Labs, Weight trends, TF tolerance, I & O's, Diet advancement (TPN)  REASON FOR ASSESSMENT:   Consult Enteral/tube feeding initiation and management  ASSESSMENT:   47 y.o. female with PMH of recurrent breast cancer not currently on treatment, Crohn's disease s/p subtotal colectomy in 2021, recurrent obstructions, colonic stricture, recent hospitalization 8/10-8/15/2024 for obstruction who presented on 12/12/2022 with abdominal pain, nausea, vomiting diarrhea.  9/4 Admit; found down in ED bathroom with no pulse, intubated 9/5 s/p ex-lap, found to have internal hernia with small  bowel ischemia, only ~100cm of small bowel remaining 9/7 ex-lap, I&D, end ileostomy 9/8 TPN started at 3mL/hr 9/9 TPN increased to 42mL/hr 9/10 extubated, NGT removed but then replaced. 9/12: TF stopped 9/14: small bore NGT placed for medications, MBS performed  Patient continues on cyclic TPN. Plan to adjust to 14 hour cyclic feeds this evening. 1 hour ramp up at 44mL/hr, 12 hours at 147mL/hr, and 1 hour ramp down at 64mL/hr. Plan to eventually transition to 12 hour cyclic feeds.   Patient remains NPO. Last SLP eval and MBS was 9/14 and recommended continued NPO. SLP to re-eval patient.  SBFT remains in place, xray read on 9/14 indicates tip at the pylorus/duodenal bulb.  Per Surgery, plan to start trickle tube feeds today. Per discussion with Surgery PA, plan for only 61mL/hr with no plans for advancement today.  Also discussed vitamins with Surgery. Surgery wanting to start supplementation fat soluble vitamins ADEK. Per pharmacy vitamins cannot be added to the TPN.  Plan to start gastric vitamin D and vitamin E at low doses until labs come back to assess need for increase to repletion doses. Plan to check vitamin A prior to supplementing.   Admission weight: 125 lbs Current weight: 136 lbs  Medications reviewed and include: 300mg  ferrous sulfate, Imodium, Portonix, Fibercon BID  Labs reviewed:  Creatinine 1.47 HA1C 5.7 Blood Glucose 78-138 x24 hours Triglycerides 136 (9/11)   Diet Order:   Diet Order             Diet NPO time specified Except for: Other (See Comments)  Diet effective now                   EDUCATION NEEDS:  Education needs have been addressed  Skin:  Skin Assessment: Skin Integrity Issues: Skin Integrity Issues:: Incisions Incisions: Abdomen  Last BM:  9/16 -type 7 -ileostomy  Height:  Ht Readings from Last 1 Encounters:  12/13/22 5\' 4"  (1.626 m)   Weight:  Wt Readings from Last 1 Encounters:  12/27/22 61.8 kg    BMI:  Body mass index  is 23.39 kg/m.  Estimated Nutritional Needs:  Kcal:  1600-1800 Protein:  85-95 grams Fluid:  1.6L/day    Shelle Iron RD, LDN For contact information, refer to [facility].

## 2022-12-27 NOTE — Progress Notes (Signed)
11 Days Post-Op  Subjective: CC: Seen with RN.  Patient reports scheduled oxycodone per tube doesn't make her feel well after it is given. Agreeable to make this prn. Denies any abdominal pain this morning .No nausea or vomiting. Having ostomy output. Currently NPO per SLP. TF's currently held. Having ostomy output - 2050cc/24 hours.  Febrile to 101.1 over the last 24 hours. Tachycardic on scheduled metoprolol, 25 mg BID. BP has improved with last pressure of 120/82 - not on pressors.  Hgb 8.2 s/p 1U PRBC yesterday.  WBC 23.6 (27.4). BMP pending.  S/p paracentesis yesterday with 500cc of clear yellow fluid. Cx pending. No growth to date.  Reports some sob and a productive cough. No LE pain. .   Objective: Vital signs in last 24 hours: Temp:  [99.5 F (37.5 C)-101.1 F (38.4 C)] 100.2 F (37.9 C) (09/18 0200) Pulse Rate:  [84-139] 119 (09/18 0200) Resp:  [18-32] 21 (09/18 0200) BP: (94-132)/(53-86) 120/82 (09/18 0200) SpO2:  [97 %-100 %] 100 % (09/18 0200) Weight:  [61.8 kg] 61.8 kg (09/18 0408) Last BM Date : 12/25/22  Intake/Output from previous day: 09/17 0701 - 09/18 0700 In: 4261.7 [I.V.:3039.6; Blood:1113.5; IV Piggyback:108.6] Out: 4150 [Urine:2100; Stool:2050] Intake/Output this shift: No intake/output data recorded.  PE: Gen:  Alert, pleasant Chest Wall: Port in place without surrounding cellulitis  Card:  Tachycardic Pulm: Rate and effort normal Abd: Soft, mild distension, NT on exam.  No rigidity or guarding. Ostomy pink, budded and viable with green semi-liquid stool in ostomy bag. Midline wound appears stable from picture on 9/17. No active bleeding. GU: Foley draining straw colored urine.  LE: No LE edema or calf ttp  Lab Results:  Recent Labs    12/26/22 0500 12/27/22 0359  WBC 27.4* 23.6*  HGB 6.9* 8.2*  HCT 20.7* 24.5*  PLT 252 260   BMET Recent Labs    12/25/22 0334 12/26/22 0500  NA 138 134*  K 4.5 4.6  CL 107 104  CO2 22 21*   GLUCOSE 136* 132*  BUN 51* 57*  CREATININE 1.41* 1.76*  CALCIUM 7.6* 7.7*   PT/INR No results for input(s): "LABPROT", "INR" in the last 72 hours.  CMP     Component Value Date/Time   NA 134 (L) 12/26/2022 0500   NA 135 01/21/2020 1056   K 4.6 12/26/2022 0500   CL 104 12/26/2022 0500   CO2 21 (L) 12/26/2022 0500   GLUCOSE 132 (H) 12/26/2022 0500   BUN 57 (H) 12/26/2022 0500   BUN 12 01/21/2020 1056   CREATININE 1.76 (H) 12/26/2022 0500   CREATININE 0.91 09/21/2022 1141   CALCIUM 7.7 (L) 12/26/2022 0500   PROT 3.8 (L) 12/25/2022 0334   ALBUMIN <1.5 (L) 12/25/2022 0334   AST 16 12/25/2022 0334   AST 18 09/21/2022 1141   ALT 12 12/25/2022 0334   ALT 12 09/21/2022 1141   ALKPHOS 69 12/25/2022 0334   BILITOT 1.0 12/25/2022 0334   BILITOT 0.4 09/21/2022 1141   GFRNONAA 36 (L) 12/26/2022 0500   GFRNONAA >60 09/21/2022 1141   GFRAA 108 01/21/2020 1056   Lipase     Component Value Date/Time   LIPASE 762 (H) 12/21/2022 0305    Studies/Results: US Paracentesis  Result Date: 12/26/2022 INDICATION: Patient postoperative day 12 for exploratory laparotomy with small-bowel resection. Large volume ascites visualized on CT abdomen pelvis from 12/25/2022. Request received for diagnostic paracentesis EXAM: ULTRASOUND GUIDED  PARACENTESIS MEDICATIONS: 10 cc 1% lidocaine COMPLICATIONS: None immediate.  PROCEDURE: Informed written consent was obtained from the patient after a discussion of the risks, benefits and alternatives to treatment. A timeout was performed prior to the initiation of the procedure. Initial ultrasound scanning demonstrates a large amount of ascites within the right lower abdominal quadrant. The right lower abdomen was prepped and draped in the usual sterile fashion. 1% lidocaine was used for local anesthesia. Following this, a 19 gauge, 7-cm, Yueh catheter was introduced. An ultrasound image was saved for documentation purposes. The paracentesis was performed. The catheter  was removed and a dressing was applied. The patient tolerated the procedure well without immediate post procedural complication. FINDINGS: A total of approximately 500 mL of clear yellow fluid was removed. Samples were sent to the laboratory as requested by the clinical team. IMPRESSION: Successful ultrasound-guided paracentesis yielding 500 mL of peritoneal fluid. Procedure performed by Mina Marble, PA-C Electronically Signed   By: Marliss Coots M.D.   On: 12/26/2022 20:28   CT ABDOMEN PELVIS WO CONTRAST  Result Date: 12/25/2022 CLINICAL DATA:  Postoperative abdominal pain, history of breast cancer and Crohn disease EXAM: CT ABDOMEN AND PELVIS WITHOUT CONTRAST TECHNIQUE: Multidetector CT imaging of the abdomen and pelvis was performed following the standard protocol without IV contrast. Unenhanced CT was performed per clinician order. Lack of IV contrast limits sensitivity and specificity, especially for evaluation of abdominal/pelvic solid viscera. RADIATION DOSE REDUCTION: This exam was performed according to the departmental dose-optimization program which includes automated exposure control, adjustment of the mA and/or kV according to patient size and/or use of iterative reconstruction technique. COMPARISON:  12/13/2022, 12/23/2022 FINDINGS: Lower chest: There are bilateral pleural effusions and bibasilar atelectasis, right greater than left. No evidence of pneumothorax. Hepatobiliary: Gallbladder is decompressed. Unremarkable unenhanced appearance of the liver. Pancreas: Unremarkable unenhanced appearance. Spleen: Unremarkable unenhanced appearance. Adrenals/Urinary Tract: No urinary tract calculi. Foley catheter decompresses the bladder. The adrenals are unremarkable. Stomach/Bowel: Postsurgical changes from subtotal colectomy, small-bowel resection, and left lower quadrant ileostomy. Oral contrast is seen throughout the stomach and small bowel to the level of the ileostomy. Gastrojejunostomy tube  identified, tip extending into the proximal jejunum just beyond the ligament of Treitz. Vascular/Lymphatic: No significant vascular findings on this unenhanced exam. No pathologic adenopathy. Multiple subcentimeter mesenteric lymph nodes are likely reactive. Reproductive: Stable calcified uterine fibroid.  No adnexal masses. Other: Large volume ascites throughout the abdomen and pelvis. Postsurgical changes are seen from midline laparotomy, with surgical packing material within the laparotomy incision site. There is a small amount of pneumoperitoneum within the upper abdomen, with superimposed gas fluid level. This is nonspecific given recent laparotomy. No evidence of abdominal wall hernia. Musculoskeletal: Diffuse subcutaneous edema. No acute or destructive bony abnormalities. Reconstructed images demonstrate no additional findings. IMPRESSION: 1. Postsurgical changes from subtotal colectomy, small-bowel resection, and left lower quadrant ileostomy. No evidence of bowel obstruction, as oral contrast is seen throughout the stomach and small bowel to the level of the ileostomy. 2. Postsurgical changes from midline laparotomy, with a small amount of pneumoperitoneum within the central upper abdomen. This is nonspecific given recent surgical intervention. 3. Large volume ascites. 4. Bilateral pleural effusions with underlying compressive atelectasis, right greater than left. 5. Diffuse subcutaneous edema. These results were called by telephone at the time of interpretation on 12/25/2022 at 457 pm to provider Chevis Pretty, who verbally acknowledged these results. Electronically Signed   By: Sharlet Salina M.D.   On: 12/25/2022 17:03    Anti-infectives: Anti-infectives (From admission, onward)    Start  Dose/Rate Route Frequency Ordered Stop   12/23/22 2000  vancomycin (VANCOREADY) IVPB 1250 mg/250 mL  Status:  Discontinued        1,250 mg 166.7 mL/hr over 90 Minutes Intravenous Every 36 hours 12/22/22 0612  12/22/22 1103   12/22/22 0645  vancomycin (VANCOREADY) IVPB 1250 mg/250 mL        1,250 mg 166.7 mL/hr over 90 Minutes Intravenous  Once 12/22/22 0551 12/22/22 0805   12/22/22 0600  piperacillin-tazobactam (ZOSYN) IVPB 3.375 g        3.375 g 12.5 mL/hr over 240 Minutes Intravenous Every 8 hours 12/22/22 0551     12/13/22 1000  piperacillin-tazobactam (ZOSYN) IVPB 3.375 g  Status:  Discontinued        3.375 g 12.5 mL/hr over 240 Minutes Intravenous Every 8 hours 12/13/22 0865 12/20/22 1333        Assessment/Plan Hx Crohn's disease s/p subtotal colectomy  Internal hernia with bowel ischemia - POD 13 exploratory laparotomy with small bowel resection 9/5 Dr. Hillery Hunter - POD 11 s/p exploratory laparotomy, ileostomy, abdominal closure 9/7 Dr. 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 Highly likely to have high ostomy output  - Cont TPN. Will likely need TPN at d/c - Imodium 2mg  BID, Iron 325mg  BID, Fibercon 625mg  BID started 9/11 to slow ileostomy output down.  Increased loperamide from 2 mg to 4 twice daily 9/14.  Increase loperamide to 4mg  QID today. May need intermittent fluid replacement for high ileostomy output.  - Start trickle TF's - NPO per SLP. Follow for recommendations  - WOCN following for new ostomy. Plan ostomy clinic referral at d/c - Midline wound with hx of bleeding leading > no signs of active bleeding currently. Discussed with MD. Plan to start VAC today. WOCN aware.  - CT A/P 9/16 done. No indication for return to the OR. Large amount of ascites. S/p IR paracentesis yesterday with 500cc of clear yellow fluid. Cx pending. No growth to date.  - PT/OT - Palliative following peripherally currently.   ID - Zosyn 9/4>>9/11. 9/13 >> Patient still febrile with elevated leukocytosis. CT A/P without clear source of fever and paracentesis yesterday with clear yellow fluid and cx w/ no growth to date. Will look for other  sources of fever. CXR 9/13 w/ atelectasis or infiltrate. She complains of SOB and productive cough. Check CXR and Resp Cx. UA and LE [place] also ordered. Will check Bcx including specific fungal blood cx - has a port and R subclavian triple lumen (check with ID pharm to ensure ordered correctly).   FEN - NPO per SLP.  Trickle TF's. TPN VTE - SCDs, HEP GTT held for bleeding/ anemia.  Foley - Failed TOV. Foley in place with good UOP.   --per [facility]- PE - Noted on CTA 9/10. LE [place] negative for DVT. Heparin gtt currently on hold for ABL anemia. Agree with CCM that we may need to consider IVC temporary filter at some point if unable to restart heparin gtt (hx breast ca, recent surgery, immobilized). Recheck LE [place] today. If hgb stable in AM hopefully can restart Heparin with no bolus.  Shock - off pressors VDRF - extubated  Recent cardiac arrest 9/3 AKI - Cr 1.74 yesterday. Repeat pending today. Trend.  Anemia - Transfuse to keep hemoglobin above 7. S/p 1U PRBC 9/12, 2U 9/14, 1U 9/16, 1U 9/17. Hgb stable today  today at 8.2. Consider IV iron supplementation - she has short gut  so worried she will not absorb iron well orally. Last PT-INR 9/15 was 1.3.  Hx metastatic breast cancer   LOS: 14 days    Jacinto Halim , [facility] Surgery 12/27/2022, 8:38 AM Please see Amion for pager number during day hours 7:00am-4:30pm

## 2022-12-27 NOTE — Progress Notes (Addendum)
OT Cancellation Note  Patient Details Name: [name] MRN: [id number] DOB: 1975-12-14   Cancelled Treatment:    Reason Eval/Treat Not Completed: Patient at procedure or test/ unavailable Patient is pending wound vac placement with WOC nurse at this time. OT to continue to follow.  Rosalio Loud, MS Acute Rehabilitation Department Office# [phone]  12/27/2022, 11:08 AM

## 2022-12-27 NOTE — Consult Note (Signed)
WOC Nurse wound follow up Wound type:surgical  Measurement:17.5cm x 4.5cmx 2.0cm  Wound ZOX:WRUEA; suture visible midwound, clotting noted at distal end of wound bed, dark tissue at 1 o'clock  Drainage (amount, consistency, odor) serosanguinous  Periwound:intact, ileostomy left lateral aspect of wound bed  Dressing procedure/placement/frequency: Removed saline moist gauze dressing Apply 1pc of black foam, sealed at , patient tolerated well, received IV pain meds prior to dressing change.  WOC Nurse ostomy follow up Stoma type/location: LLQ, ileostomy  Stomal assessment/size: 1 1/4" round, budded, pink, moist Peristomal assessment: intact  Treatment options for stomal/peristomal skin: 2" ostomy barrier ring Output gelatinous green Ostomy pouching: 2pc. 2 1/4" high out put pouch to BSD  Education provided:  Patient alert and awake Explained role of ostomy nurse and creation of stoma  Explained stoma characteristics (budded, flush, color, texture, care) Demonstrated pouch change (cutting new skin barrier, measuring stoma, cleaning peristomal skin and stoma, use of barrier ring) Discussed bathing, diet, gas, medication use, constipation, diarrhea, dehydration   Provided patient with [employer], did not mark items, will need to do that prior to DC Answered patient/family questions:  image taken of stoma and patient able to see, she is positive and engaged to learn. Image deleted from phone in the presence of the patient.  Left new educational materials and hand mirror in the room to allow patient to be able to see with next pouch change better.    Enrolled patient in [employer] Discharge program: Yes

## 2022-12-27 NOTE — Plan of Care (Signed)

## 2022-12-27 NOTE — Plan of Care (Signed)
  Problem: Education: Goal: Knowledge of General Education information will improve Description: Including pain rating scale, medication(s)/side effects and non-pharmacologic comfort measures Outcome: Progressing   Problem: Health Behavior/Discharge Planning: Goal: Ability to manage health-related needs will improve Outcome: Progressing   Problem: Clinical Measurements: Goal: Diagnostic test results will improve Outcome: Progressing   Problem: Nutrition: Goal: Adequate nutrition will be maintained Outcome: Progressing   Problem: Fluid Volume: Goal: Ability to maintain a balanced intake and output will improve Outcome: Not Progressing

## 2022-12-27 NOTE — Progress Notes (Signed)
Speech Language Pathology Treatment: Dysphagia  Patient Details Name: [name] MRN: [id number] DOB: [birth year] Today's Date: 12/27/2022 Time: 1601-0932 SLP Time Calculation (min) (ACUTE ONLY): 22 min  Assessment / Plan / Recommendation Clinical Impression  Patient seen for dysphagia management - Today pt is fully alert, smiling and willing to participate in therapy.  Pt brushed her teeth and able to orally manipulate swishing and expectorating with water.    PO trials commenced including [place] ice bolus x4, applesauce x4, and nectar thick juice x 7 boluses.  Swallow is much more timely than on MBS *per review of study*. Reflexive cough noted x2 of 31 boluses total as well as subtle throat clearing x5.  Cued cough did not results in expectoration of secretions nor po consumption.    Per MBS study, when pt aspirated, she did reflexively cough but did not clear aspirates.   She does endorse occasionally coughing and expectorating secretions.    Recommend at this time to allow pt to have minimal amount of po with nursing only of tsps nectar thick liquids and few bites of applesauce monitoring vitals closely.    Plan repeat MBS on Friday 12/29/2022 and obtained surgery permission for this plan.  All in agreement and swallow precaution signs posted.     HPI HPI: Per CCM note "47 year old female with past medical history of recurrent breast cancer not currently on treatment, Crohn's disease s/p subtotal colectomy in 2021 recurrent obstructions, colonic stricture, IDA, recent hospitalization 8/10-8/15/2024 for obstruction seen by GI and general surgery. Underwent EGD and flexible sigmoidoscopy and discharged on steroids who presented to the emergency department on 12/12/2022 with abdominal pain, nausea, vomiting diarrhea. In the ED tachycardic, hypertensive. ED provider notes abdominal distention. She was given 2L IVF, morphine, IV PPI. Labs with WBC 10.8, Hgb 10.7, K 3.2. CT AP was ordered and  showed markedly dilated distal small bowel consistent with chronic obstruction. No changes since prior study. Patient was admitted to [facility]. They consulted GI for input. Unfortunately, around 0820 9/4 patient was found on the floor in the bathroom in ED pulseless. They estimate downtime around 5 minutes. ACLS was initiated. Concern for possible torsades de pointes and given IV mag, defibrillated. Coded for around 5 minutes. She was intubated. She had copious vomitus in the ETT. ROSC achieved and ICU was consulted. Notably, she had I-stat peri-code which showed hemoglobin of 5.5 down from 10 overnight."  Pt was extubated 12/18/2021.      SLP Plan  Continue with current plan of care      Recommendations for follow up therapy are one component of a multi-disciplinary discharge planning process, led by the attending physician.  Recommendations may be updated based on patient status, additional functional criteria and insurance authorization.    Recommendations  Diet recommendations: Nectar-thick liquid;Dysphagia 1 (puree) (via tsp only!) Liquids provided via: Teaspoon Medication Administration: Via alternative means Supervision: Full supervision/cueing for compensatory strategies (from nursing) Compensations: Slow rate;Small sips/bites (HOB minimally reclined recommended - approx 40*)                  Oral care QID;Oral care prior to ice chip/H20   Frequent or constant Supervision/Assistance Dysphagia, oropharyngeal phase (R13.12)     Continue with current plan of care   Rolena Infante, MS [facility] SLP Acute Rehab Services Office [phone]   Chales Abrahams  12/27/2022, 3:17 PM

## 2022-12-27 NOTE — Progress Notes (Signed)
Occupational Therapy Treatment Patient Details Name: [name] MRN: [id number] DOB: 17-Oct-1975 Today's Date: 12/27/2022   History of present illness Patient is a 47 year old female who presented on 9/3 with abdominal distension and nausea/vomiting. 9/4 patient was found in ED bathroom with no pulse with ROSC in about 5 mins. Patient was intubated at this time. 9/5 patient underwent exploratory laparotomy that revealed internal hernia with associated small bowel ischemia with patient undergoing bowel resection. 9/7 patient returned to OR for ostomy placement. Patient was extubated on 9/10 and also found to have PE. PMH: breast cancer, Chron's disease, IDA, recent hospitalization 8/10 to 8/15 with bowel obstruction and EGD   OT comments  Patient was able to progress to standing in STEDY today to increase functional activity tolerance and progress closer to being able to transfer out of bed. Patient was motivated to participate. Patient's HR noted to reach 154 bpm max with activity. Patient was continues to have decreased functional activity tolernace, decreased ROM, decreased BUE strength, decreased endurance, decreased sitting balance, decreased standing balanced, decreased safety awareness, and decreased knowledge of AE/AD impacting participation in ADLs. Patient will benefit from intensive inpatient follow up therapy, >3 hours/day.       If plan is discharge home, recommend the following:  Two people to help with walking and/or transfers;Two people to help with bathing/dressing/bathroom;Direct supervision/assist for medications management;Assistance with cooking/housework;Assist for transportation;Help with stairs or ramp for entrance;Direct supervision/assist for financial management   Equipment Recommendations  None recommended by OT       Precautions / Restrictions Precautions Precautions: Fall Precaution Comments: abdominal precautions, wound vac, abominal binder, ostomy, nasal  tube Restrictions Weight Bearing Restrictions: No       Mobility Bed Mobility Overal bed mobility: Needs Assistance Bed Mobility: Rolling, Sidelying to Sit, Sit to Sidelying Rolling: Max assist Sidelying to sit: Max assist, +2 for physical assistance, +2 for safety/equipment     Sit to sidelying: +2 for safety/equipment, +2 for physical assistance General bed mobility comments: with increased time and cues for log rolling to prevent pressure on abdomen. patient needed physical A for trunk and BLE movement.           Balance Overall balance assessment: Needs assistance Sitting-balance support: Bilateral upper extremity supported, Feet supported Sitting balance-Leahy Scale: Fair Sitting balance - Comments: UE support   Standing balance support: Reliant on assistive device for balance, Bilateral upper extremity supported Standing balance-Leahy Scale: Poor     ADL either performed or assessed with clinical judgement   ADL Overall ADL's : Needs assistance/impaired     Toilet Transfer: +2 for physical assistance;+2 for safety/equipment Toilet Transfer Details (indicate cue type and reason): in STEDY patient was able to stand with +2 and remain standing for about 30 seconds with CGA +2. patient was educated on how well she was doing. patient quick to fatigue with task and noted to have HR reach 154bpm with activity.           General ADL Comments: patient was educated on chaplin services and staff being here for her when she needs to vent. patient verbalized understanding.      Cognition Arousal: Alert Behavior During Therapy: WFL for tasks assessed/performed       General Comments: patient was more participatory speaking wise. patient did have a moment of tearfulness at end of session when patient reported difficulty with rfinding someone to watch her grandchild for her daughter while she was here. patients thoughts and feelings were heard  and validated. patient  continues to have near no voice.                   Pertinent Vitals/ Pain       Pain Assessment Pain Assessment: Faces Faces Pain Scale: Hurts little more Pain Location: abdomen Pain Descriptors / Indicators: Grimacing, Constant Pain Intervention(s): Limited activity within patient's tolerance, Monitored during session         Frequency  Min 2X/week        Progress Toward Goals  OT Goals(current goals can now be found in the care plan section)  Progress towards OT goals: Progressing toward goals     Plan      Co-evaluation    PT/OT/SLP Co-Evaluation/Treatment: Yes Reason for Co-Treatment: To address functional/ADL transfers;For patient/therapist safety;Complexity of the patient's impairments (multi-system involvement) PT goals addressed during session: Mobility/safety with mobility OT goals addressed during session: ADL's and self-care      AM-PAC OT "6 Clicks" Daily Activity     Outcome Measure   Help from another person eating meals?: Total (NPO) Help from another person taking care of personal grooming?: A Lot Help from another person toileting, which includes using toliet, bedpan, or urinal?: Total Help from another person bathing (including washing, rinsing, drying)?: Total Help from another person to put on and taking off regular upper body clothing?: Total Help from another person to put on and taking off regular lower body clothing?: Total 6 Click Score: 7    End of Session Equipment Utilized During Treatment: Other (comment) (STEDY)  OT Visit Diagnosis: Unsteadiness on feet (R26.81);Other abnormalities of gait and mobility (R26.89);Pain   Activity Tolerance Patient limited by pain   Patient Left in bed;with call bell/phone within reach;with bed alarm set   Nurse Communication Mobility status        Time: 4098-1191 OT Time Calculation (min): 31 min  Charges: OT General Charges $OT Visit: 1 Visit OT Treatments $Therapeutic Activity:  8-22 mins  Rosalio Loud, MS Acute Rehabilitation Department Office# [phone]   Selinda Flavin 12/27/2022, 4:39 PM

## 2022-12-27 NOTE — Progress Notes (Signed)
PT Cancellation Note  Patient Details Name: KAROLEE MESKE MRN: [id number] DOB: [birth year]   Cancelled Treatment:    Reason Eval/Treat Not Completed: Patient at procedure or test/unavailable, attempted earlier, patient  requiring premedication,then VAC being placed. Will check back another time. Blanchard Kelch PT Acute Rehabilitation Services Office [phone] Weekend [phone]    [name] 12/27/2022, 12:11 PM

## 2022-12-28 ENCOUNTER — Inpatient Hospital Stay (HOSPITAL_COMMUNITY): Payer: Managed Care, Other (non HMO)

## 2022-12-28 DIAGNOSIS — K90829 Short bowel syndrome, unspecified: Secondary | ICD-10-CM | POA: Diagnosis not present

## 2022-12-28 DIAGNOSIS — Z86711 Personal history of pulmonary embolism: Secondary | ICD-10-CM

## 2022-12-28 DIAGNOSIS — R509 Fever, unspecified: Secondary | ICD-10-CM

## 2022-12-28 LAB — PHOSPHORUS: Phosphorus: 3.7 mg/dL (ref 2.5–4.6)

## 2022-12-28 LAB — COMPREHENSIVE METABOLIC PANEL
Alkaline Phosphatase: 126 U/L (ref 38–126)
Anion gap: 9 (ref 5–15)
Chloride: 105 mmol/L (ref 98–111)
GFR, Estimated: 45 mL/min — ABNORMAL LOW (ref >60)
Total Bilirubin: 0.7 mg/dL (ref 0.3–1.2)

## 2022-12-28 LAB — COMPREHENSIVE METABOLIC PANEL WITH GFR
ALT: 19 U/L (ref 0–44)
AST: 23 U/L (ref 15–41)
Albumin: <1.5 g/dL — ABNORMAL LOW (ref 3.5–5.0)
BUN: 48 mg/dL — ABNORMAL HIGH (ref 6–20)
CO2: 23 mmol/L (ref 22–32)
Calcium: 7.5 mg/dL — ABNORMAL LOW (ref 8.9–10.3)
Creatinine, Ser: 1.46 mg/dL — ABNORMAL HIGH (ref 0.44–1.00)
Glucose, Bld: 143 mg/dL — ABNORMAL HIGH (ref 70–99)
Potassium: 4 mmol/L (ref 3.5–5.1)
Sodium: 137 mmol/L (ref 135–145)
Total Protein: 4.8 g/dL — ABNORMAL LOW (ref 6.5–8.1)

## 2022-12-28 LAB — GLUCOSE, CAPILLARY
Glucose-Capillary: 130 mg/dL — ABNORMAL HIGH (ref 70–99)
Glucose-Capillary: 116 mg/dL — ABNORMAL HIGH (ref 70–99)
Glucose-Capillary: 99 mg/dL (ref 70–99)
Glucose-Capillary: 82 mg/dL (ref 70–99)
Glucose-Capillary: 110 mg/dL — ABNORMAL HIGH (ref 70–99)

## 2022-12-28 LAB — CBC
HCT: 23.3 % — ABNORMAL LOW (ref 36.0–46.0)
Hemoglobin: 7.6 g/dL — ABNORMAL LOW (ref 12.0–15.0)
MCH: 29.8 pg (ref 26.0–34.0)
MCHC: 32.6 g/dL (ref 30.0–36.0)
MCV: 91.4 fL (ref 80.0–100.0)
Platelets: 280 10*3/uL (ref 150–400)
RBC: 2.55 MIL/uL — ABNORMAL LOW (ref 3.87–5.11)
RDW: 16.8 % — ABNORMAL HIGH (ref 11.5–15.5)
WBC: 21 10*3/uL — ABNORMAL HIGH (ref 4.0–10.5)
nRBC: 0 % (ref 0.0–0.2)

## 2022-12-28 LAB — VITAMIN D 25 HYDROXY (VIT D DEFICIENCY, FRACTURES): Vit D, 25-Hydroxy: 24.57 ng/mL — ABNORMAL LOW (ref 30–100)

## 2022-12-28 LAB — PREALBUMIN: Prealbumin: 11 mg/dL — ABNORMAL LOW (ref 18–38)

## 2022-12-28 LAB — MAGNESIUM: Magnesium: 2.1 mg/dL (ref 1.7–2.4)

## 2022-12-28 MED ORDER — TRAVASOL 10 % IV SOLN
INTRAVENOUS | Status: AC
  Filled 2022-12-28: qty 940.8

## 2022-12-28 MED ORDER — SODIUM CHLORIDE 0.9 % IV SOLN
100.0000 mg | INTRAVENOUS | Status: DC
  Administered 2022-12-28 – 2023-01-04 (×8): 100 mg via INTRAVENOUS
  Filled 2022-12-28 (×9): qty 5

## 2022-12-28 NOTE — Progress Notes (Signed)
Speech Language Pathology Treatment: Dysphagia  Patient Details Name: [name] MRN: [id number] DOB: 1975/05/17 Today's Date: 12/28/2022 Time: 7829-5621 SLP Time Calculation (min) (ACUTE ONLY): 23 min  Assessment / Plan / Recommendation Clinical Impression  Pt seen for skilled SLP to address her dysphagia - providing her with exercise to improve her laryngeal closure/elevation for maximal airway protection. Exercise entailed lingual press and pt able to perform after max cues initially fading to mod I- total of 5 repetitions observed.   In addition po trials included nectar thick Boost and [place] via tsp and [place] ice. Swallow clinically timely with intact oral control. She continues with subtle cough after swallowing [place] ice and on 1/5 boluses of nectar- cough is weak and nonprotective. Pt is getting stronger overall and making progress despite still being aphonic. hopeful for her to be able to consume a diet following MBS next date.   Of note the RN who placed smallbore feeding tube reports it was very challenging to get it through the pharynx into the esophagus causing SLP to suspect the tight UES is contributing to deficit.   Recommend MBS next date with ability to remove smallbore feeding tube if it is certain patient will be able to consume a diet regardless and it could be contributing to dysphagia.   SLP will reach out to surgery to see if can obtain permission and RN made aware.   Left patient with approximately a quarter of a cup of boost breeze nectar thick for her to consume via tsp. Encourage patient also to continue her flutter valve.    HPI HPI: Per CCM note "47 year old female with past medical history of recurrent breast cancer not currently on treatment, Crohn's disease s/p subtotal colectomy in 2021 recurrent obstructions, colonic stricture, IDA, recent hospitalization 8/10-8/15/2024 for obstruction seen by GI and general surgery. Underwent EGD and flexible  sigmoidoscopy and discharged on steroids who presented to the emergency department on 12/12/2022 with abdominal pain, nausea, vomiting diarrhea. In the ED tachycardic, hypertensive. ED provider notes abdominal distention. She was given 2L IVF, morphine, IV PPI. Labs with WBC 10.8, Hgb 10.7, K 3.2. CT AP was ordered and showed markedly dilated distal small bowel consistent with chronic obstruction. No changes since prior study. Patient was admitted to [facility]. They consulted GI for input. Unfortunately, around 0820 9/4 patient was found on the floor in the bathroom in ED pulseless. They estimate downtime around 5 minutes. ACLS was initiated. Concern for possible torsades de pointes and given IV mag, defibrillated. Coded for around 5 minutes. She was intubated. She had copious vomitus in the ETT. ROSC achieved and ICU was consulted. Notably, she had I-stat peri-code which showed hemoglobin of 5.5 down from 10 overnight."  Pt was extubated 12/18/2021.  Pt has been maintained on TPN and is now getting trickle feeds for nutrition. SLP following for dysphagia management.      SLP Plan  MBS (MBS planned next date on September 20, tentatively planned in the morning)      Recommendations for follow up therapy are one component of a multi-disciplinary discharge planning process, led by the attending physician.  Recommendations may be updated based on patient status, additional functional criteria and insurance authorization.    Recommendations  Diet recommendations:  (P.o. trials of nectar thick liquid via teaspoon with RN) Liquids provided via: Teaspoon Supervision: Patient able to self feed Compensations: Slow rate;Small sips/bites Postural Changes and/or Swallow Maneuvers: Seated upright 90 degrees;Upright 30-60 min after meal  Dysphagia, pharyngeal phase (R13.13);Dysphagia, pharyngoesophageal phase (R13.14)     MBS (MBS planned next date on September 20, tentatively planned in  the morning)     Chales Abrahams  12/28/2022, 2:06 PM

## 2022-12-28 NOTE — Consult Note (Addendum)
ID   Date of Admission:  12/12/2022   Total days of antibiotics: 16 zosyn, vanco 7               Reason for Consult: fever    Referring Provider: Binya   Assessment: Fever, leukocytosis ? Bowel Ischemia ? Fungemia Crohn's disease AKI (prev 0.74 pta) PE  Plan: Would continue zosyn Would add micafungin Defer to surgery re: risk of continued bowel ischemia Await her Cx. (Peritoneal, blood) Caution with drugs which can worsen renal insult (she has improved somewhat from a peak of 2.11 ) Consider removing her triple lumen if possible.  Check UE doppler  Comment- Wide ddx in this patient- she is at risk for fungemia with GI surgery, prolonged antibiotics.  She has a known PE not on anticoagulation.  Bowel ischemia was noted on her surgery, probably related to her SBO. Resolved?   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   Malignant neoplasm of upper-outer quadrant of right breast in female, estrogen receptor positive (HCC)   Hypokalemia   Port-A-Cath in place   Breast cancer (HCC)   Crohn's disease of both small and large intestine with intestinal obstruction (HCC)   Chronic disease anemia   Protein-calorie malnutrition, severe (HCC)   Failure to thrive in adult   Anorexia   Cardiac arrest (HCC)   Septic shock (HCC)   SBO with strangulation s/p resection 12/14/2022   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   Malnutrition of moderate degree   Palliative care by specialist   Abdominal pain    sodium chloride   Intravenous Once   acetaminophen (TYLENOL) oral liquid 160 mg/5 mL  1,000 mg Per Tube TID   Chlorhexidine Gluconate Cloth  6 each Topical Q2200   cholecalciferol  400 Units Per Tube Daily   famotidine  20 mg Per Tube Daily   feeding supplement (VITAL AF 1.2 CAL)  1,000 mL Per Tube Q24H   ferrous sulfate  300 mg  Per Tube BID   fiber  1 packet Per Tube BID   gabapentin  200 mg Per Tube Q8H   insulin aspart  0-9 Units Subcutaneous 4 times per day   loperamide HCl  4 mg Per Tube QID   metoprolol tartrate  25 mg Per Tube BID   sodium chloride flush  10-40 mL Intracatheter Q12H   vitamin E  100 Units Per Tube Daily    HPI: [name] is a 47 y.o. female with hx of Crohn's disease adm 9-3 with SBO. On 9-4 she was found pulseless, down ~5 minutes. She was adm to ICU, required transfusion (hgb 10--> 5.5).  She underwent ex-lap 9-5 with small bowel resection due to bowel ischemia. She then underwent ex-lap with ileostomy, close on 9-7. She still has VAC in place.  By 9-8 was weaned off pressors, and extubated 9-10. She was also found to have PE on 9-10 however was not anticoagulated due to bleeding.  Since has developed short gut syndrome and high ostomy output.   She has continued to have fever and leukocytosis. She underwent CT a/p 9-16 which showed a large amount of ascites. She had 500cc removed by IR 9-18.  She had repeat Cx sent 9-18.  She has been on zosyn since adm, added vanco 9-12.  Temp to 101.3 in lat 24h.  Had LE doppler yesterday, (-).   Has had port since March.   Review of Systems: ROS See above She c/o pain at midline wound.   Past Medical History:  Diagnosis Date   Breast cancer (HCC)    right   Colon stricture (HCC) 05/07/2019   Colonic stricture (HCC) 11/19/2022   Crohn's colitis, with intestinal obstruction (HCC) 09/17/2013   Dx 2006 - right and left colon involved 2011 - pan-colitis 09/17/2013 left colitis 60-20 cm March 2021 subtotal colectomy ileosigmoid anastomosis for colonic strictures-Dr. Maisie Fus     Crohn's disease [facility])    Exacerbation of Crohn's disease of large intestine (HCC) 12/13/2022   Family history of breast cancer 06/11/2020   History of abdominal subtotal colectomy 11/19/2022   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  10/17/2010   Rectal bleeding    Uterine fibroid    Vitamin D deficiency 09/24/2013    Social History   Tobacco Use   Smoking status: Former    Types: Cigarettes   Smokeless tobacco: Never  Vaping Use   Vaping status: Never Used  Substance Use Topics   Alcohol use: Yes    Alcohol/week: 1.0 - 2.0 standard drink of alcohol    Types: 1 - 2 Glasses of wine per week   Drug use: No    Family History  Problem Relation Age of Onset   Diabetes Father    Breast cancer Maternal Aunt        dx unknown age   Diabetes Paternal Aunt    Breast cancer Cousin        maternal cousin; dx unknown age   Esophageal cancer Neg Hx    Rectal cancer Neg Hx    Stomach cancer Neg Hx    Colon polyps Neg Hx    Colon cancer Neg Hx      Medications: Scheduled:  sodium chloride   Intravenous Once   acetaminophen (TYLENOL) oral liquid 160 mg/5 mL  1,000 mg Per Tube TID   Chlorhexidine Gluconate Cloth  6 each Topical Q2200   cholecalciferol  400 Units Per Tube Daily   famotidine  20 mg Per Tube Daily   feeding supplement (VITAL AF 1.2 CAL)  1,000 mL Per Tube Q24H   ferrous sulfate  300 mg Per Tube BID   fiber  1 packet Per Tube BID   gabapentin  200 mg Per Tube Q8H   insulin aspart  0-9 Units Subcutaneous 4 times per day   loperamide HCl  4 mg Per Tube QID   metoprolol tartrate  25 mg Per Tube BID   sodium chloride flush  10-40 mL Intracatheter Q12H   vitamin E  100 Units Per Tube Daily    Abtx:  Anti-infectives (From admission, onward)    Start     Dose/Rate Route Frequency Ordered Stop   12/23/22 2000  vancomycin (VANCOREADY) IVPB 1250 mg/250 mL  Status:  Discontinued        1,250 mg 166.7 mL/hr over 90 Minutes Intravenous Every 36 hours 12/22/22 0612 12/22/22 1103   12/22/22 0645  vancomycin (VANCOREADY) IVPB 1250 mg/250 mL        1,250 mg 166.7 mL/hr over 90 Minutes Intravenous  Once 12/22/22 0551 12/22/22 0805   12/22/22 0600  piperacillin-tazobactam (ZOSYN) IVPB 3.375 g        3.375  g 12.5 mL/hr over 240 Minutes Intravenous Every 8 hours 12/22/22 0551     12/13/22 1000  piperacillin-tazobactam (ZOSYN) IVPB  3.375 g  Status:  Discontinued        3.375 g 12.5 mL/hr over 240 Minutes Intravenous Every 8 hours 12/13/22 0833 12/20/22 1333         OBJECTIVE: Blood pressure 108/66, pulse (!) 131, temperature (!) 100.9 F (38.3 C), resp. rate (!) 26, height 5\' 4"  (1.626 m), weight 63.4 kg, last menstrual period 04/24/2021, SpO2 97%.  Physical Exam Vitals reviewed.  Constitutional:      General: She is not in acute distress.    Appearance: She is well-developed. She is not ill-appearing.  Eyes:     Extraocular Movements: Extraocular movements intact.     Pupils: Pupils are equal, round, and reactive to light.  Cardiovascular:     Rate and Rhythm: Normal rate and regular rhythm.  Pulmonary:     Effort: Pulmonary effort is normal.     Breath sounds: Decreased air movement present.  Abdominal:     General: Abdomen is flat. Bowel sounds are normal. There is no distension.     Tenderness: There is abdominal tenderness (midline at vac site. no surrounding erythema.).  Skin:    General: Skin is warm and dry.  Neurological:     General: No focal deficit present.     Mental Status: She is alert.  Psychiatric:        Mood and Affect: Mood normal.    Physical Exam Vitals reviewed.  Constitutional:      General: She is not in acute distress.    Appearance: She is well-developed. She is not ill-appearing.  Eyes:     Extraocular Movements: Extraocular movements intact.     Pupils: Pupils are equal, round, and reactive to light.  Cardiovascular:     Rate and Rhythm: Normal rate and regular rhythm.  Pulmonary:     Effort: Pulmonary effort is normal.     Breath sounds: Decreased air movement present.  Abdominal:     General: Abdomen is flat. Bowel sounds are normal. There is no distension.     Tenderness: There is abdominal tenderness (midline at vac site. no  surrounding erythema.).  Skin:    General: Skin is warm and dry.  Neurological:     General: No focal deficit present.     Mental Status: She is alert.  Psychiatric:        Mood and Affect: Mood normal.   No cordis is UE/LE. Her RUE is warm There is no erythema, tenderness or fluctuance around her R chest triple lumen catheter or L chest port.    Lab Results Results for orders placed or performed during the hospital encounter of 12/12/22 (from the past 48 hour(s))  Aerobic/Anaerobic Culture w Gram Stain (surgical/deep wound)     Status: None (Preliminary result)   Collection Time: 12/26/22  3:07 PM   Specimen: PATH Cytology Peritoneal fluid  Result Value Ref Range   Specimen Description      PERITONEAL Performed at [facility], [address] [place], [place] [address]    Special Requests      NONE Performed at [facility], [address] [place], [place] [address]    Gram Stain      FEW WBC PRESENT,BOTH PMN AND MONONUCLEAR NO ORGANISMS SEEN    Culture      NO GROWTH 2 DAYS Performed at [facility], [address] [place], [place] [address]    Report Status PENDING   Glucose, capillary     Status: None  Collection Time: 12/26/22  4:04 PM  Result Value Ref Range   Glucose-Capillary 78 70 - 99 mg/dL    Comment: Glucose reference range applies only to samples taken after fasting for at least 8 hours.   Comment 1 Notify RN    Comment 2 Document in Chart   Glucose, capillary     Status: Abnormal   Collection Time: 12/26/22  9:31 PM  Result Value Ref Range   Glucose-Capillary 115 (H) 70 - 99 mg/dL    Comment: Glucose reference range applies only to samples taken after fasting for at least 8 hours.  CBC     Status: Abnormal   Collection Time: 12/27/22  3:59 AM  Result Value Ref Range   WBC 23.6 (H) 4.0 - 10.5 K/uL   RBC 2.74 (L) 3.87 - 5.11 MIL/uL   Hemoglobin 8.2 (L) 12.0 - 15.0 g/dL   HCT 47.4 (L) 25.9 - 56.3 %   MCV  89.4 80.0 - 100.0 fL   MCH 29.9 26.0 - 34.0 pg   MCHC 33.5 30.0 - 36.0 g/dL   RDW 87.5 (H) 64.3 - 32.9 %   Platelets 260 150 - 400 K/uL   nRBC 0.0 0.0 - 0.2 %    Comment: Performed at [facility], [address] [place], [place] [address]  Glucose, capillary     Status: Abnormal   Collection Time: 12/27/22  6:33 AM  Result Value Ref Range   Glucose-Capillary 138 (H) 70 - 99 mg/dL    Comment: Glucose reference range applies only to samples taken after fasting for at least 8 hours.  Basic metabolic panel     Status: Abnormal   Collection Time: 12/27/22  8:23 AM  Result Value Ref Range   Sodium 140 135 - 145 mmol/L   Potassium 4.2 3.5 - 5.1 mmol/L   Chloride 106 98 - 111 mmol/L   CO2 22 22 - 32 mmol/L   Glucose, Bld 134 (H) 70 - 99 mg/dL    Comment: Glucose reference range applies only to samples taken after fasting for at least 8 hours.   BUN 53 (H) 6 - 20 mg/dL   Creatinine, Ser 1.66 (H) 0.44 - 1.00 mg/dL   Calcium 7.8 (L) 8.9 - 10.3 mg/dL   GFR, Estimated 45 (L) >60 mL/min    Comment: (NOTE) Calculated using the CKD-EPI Creatinine Equation (2021)    Anion gap 12 5 - 15    Comment: Performed at [facility], [address] [place], [place] [address]  Magnesium     Status: None   Collection Time: 12/27/22  8:23 AM  Result Value Ref Range   Magnesium 2.1 1.7 - 2.4 mg/dL    Comment: Performed at [facility], [address] [place], [place] [address]  Phosphorus     Status: None   Collection Time: 12/27/22  8:23 AM  Result Value Ref Range   Phosphorus 3.8 2.5 - 4.6 mg/dL    Comment: Performed at [facility], [address] [place], [place] [address]  Glucose, capillary     Status: None   Collection Time: 12/27/22 12:30 PM  Result Value Ref Range   Glucose-Capillary 93 70 - 99 mg/dL    Comment: Glucose reference range applies only to samples taken after fasting for at least 8 hours.   Comment 1  Notify RN    Comment 2 Document in Chart   Fungus culture, blood     Status:  None (Preliminary result)   Collection Time: 12/27/22  1:07 PM   Specimen: BLOOD  Result Value Ref Range   Specimen Description      BLOOD SITE NOT SPECIFIED Performed at [facility], [address] [place], [place] [address]    Special Requests      BOTTLES DRAWN AEROBIC ONLY Blood Culture adequate volume Performed at [facility], [address] [place], [place] [address]    Culture      NO GROWTH < 24 HOURS Performed at [facility], [address] [place], [place] [address]    Report Status PENDING   Culture, blood (Routine X 2) w Reflex to ID Panel     Status: None (Preliminary result)   Collection Time: 12/27/22  1:07 PM   Specimen: BLOOD RIGHT ARM  Result Value Ref Range   Specimen Description      BLOOD RIGHT ARM Performed at [facility], [address] [place], [place] [address]    Special Requests      BOTTLES DRAWN AEROBIC AND ANAEROBIC Blood Culture adequate volume Performed at [facility], [address] [place], [place] [address]    Culture      NO GROWTH < 24 HOURS Performed at [facility], [address], [place], [place] [address]    Report Status PENDING   Culture, blood (Routine X 2) w Reflex to ID Panel     Status: None (Preliminary result)   Collection Time: 12/27/22  1:07 PM   Specimen: BLOOD LEFT ARM  Result Value Ref Range   Specimen Description      BLOOD LEFT ARM Performed at [facility], [address] [place], [place] [address]    Special Requests      BOTTLES DRAWN AEROBIC ONLY Blood Culture adequate volume Performed at [facility], [address], [place], [place] [address]    Culture      NO GROWTH < 24 HOURS Performed at [facility], [address], [place], [place] [address]    Report Status PENDING   Glucose, capillary     Status: None    Collection Time: 12/27/22  5:18 PM  Result Value Ref Range   Glucose-Capillary 97 70 - 99 mg/dL    Comment: Glucose reference range applies only to samples taken after fasting for at least 8 hours.   Comment 1 Notify RN    Comment 2 Document in Chart   Glucose, capillary     Status: Abnormal   Collection Time: 12/27/22 10:32 PM  Result Value Ref Range   Glucose-Capillary 112 (H) 70 - 99 mg/dL    Comment: Glucose reference range applies only to samples taken after fasting for at least 8 hours.   Comment 1 Notify RN    Comment 2 Document in Chart   Prealbumin     Status: Abnormal   Collection Time: 12/28/22 12:13 AM  Result Value Ref Range   Prealbumin 11 (L) 18 - 38 mg/dL    Comment: Performed at [facility], [address] [place], [place] [address]  Magnesium     Status: None   Collection Time: 12/28/22  3:46 AM  Result Value Ref Range   Magnesium 2.1 1.7 - 2.4 mg/dL    Comment: Performed at [facility], [address], [place], [place] [address]  Phosphorus     Status: None   Collection  Time: 12/28/22  3:46 AM  Result Value Ref Range   Phosphorus 3.7 2.5 - 4.6 mg/dL    Comment: Performed at [facility], [address] [place], [place] [address]  CBC     Status: Abnormal   Collection Time: 12/28/22  3:46 AM  Result Value Ref Range   WBC 21.0 (H) 4.0 - 10.5 K/uL   RBC 2.55 (L) 3.87 - 5.11 MIL/uL   Hemoglobin 7.6 (L) 12.0 - 15.0 g/dL   HCT 86.5 (L) 78.4 - 69.6 %   MCV 91.4 80.0 - 100.0 fL   MCH 29.8 26.0 - 34.0 pg   MCHC 32.6 30.0 - 36.0 g/dL   RDW 29.5 (H) 28.4 - 13.2 %   Platelets 280 150 - 400 K/uL   nRBC 0.0 0.0 - 0.2 %    Comment: Performed at [facility], [address], [place], [place] [address]  VITAMIN D 25 Hydroxy (Vit-D Deficiency, Fractures)     Status: Abnormal   Collection Time: 12/28/22  3:46 AM  Result Value Ref Range   Vit D, 25-Hydroxy 24.57 (L) 30 - 100 ng/mL    Comment:  (NOTE) Vitamin D deficiency has been defined by the Institute of Medicine  and an Endocrine Society practice guideline as a level of serum 25-OH  vitamin D less than 20 ng/mL (1,2). The Endocrine Society went on to  further define vitamin D insufficiency as a level between 21 and 29  ng/mL (2).  1. IOM (Institute of Medicine). 2010. Dietary reference intakes for  calcium and D. [place]: [employer]. 2. Holick MF, Binkley Candler-McAfee, Bischoff-Ferrari HA, et al. Evaluation,  treatment, and prevention of vitamin D deficiency: an Endocrine  Society clinical practice guideline, JCEM. 2011 Jul; 96(7): 1911-30.  Performed at [facility], [address] [place], [place] [address]   Comprehensive metabolic panel     Status: Abnormal   Collection Time: 12/28/22  3:46 AM  Result Value Ref Range   Sodium 137 135 - 145 mmol/L   Potassium 4.0 3.5 - 5.1 mmol/L   Chloride 105 98 - 111 mmol/L   CO2 23 22 - 32 mmol/L   Glucose, Bld 143 (H) 70 - 99 mg/dL    Comment: Glucose reference range applies only to samples taken after fasting for at least 8 hours.   BUN 48 (H) 6 - 20 mg/dL   Creatinine, Ser 6.64 (H) 0.44 - 1.00 mg/dL   Calcium 7.5 (L) 8.9 - 10.3 mg/dL   Total Protein 4.8 (L) 6.5 - 8.1 g/dL   Albumin <4.0 (L) 3.5 - 5.0 g/dL   AST 23 15 - 41 U/L   ALT 19 0 - 44 U/L   Alkaline Phosphatase 126 38 - 126 U/L   Total Bilirubin 0.7 0.3 - 1.2 mg/dL   GFR, Estimated 45 (L) >60 mL/min    Comment: (NOTE) Calculated using the CKD-EPI Creatinine Equation (2021)    Anion gap 9 5 - 15    Comment: Performed at [facility], [address] [place], [place] [address]  Glucose, capillary     Status: Abnormal   Collection Time: 12/28/22  6:32 AM  Result Value Ref Range   Glucose-Capillary 130 (H) 70 - 99 mg/dL    Comment: Glucose reference range applies only to samples taken after fasting for at least 8 hours.   Comment 1 Notify RN    Comment 2 Document in  Chart  Glucose, capillary     Status: Abnormal   Collection Time: 12/28/22  8:12 AM  Result Value Ref Range   Glucose-Capillary 116 (H) 70 - 99 mg/dL    Comment: Glucose reference range applies only to samples taken after fasting for at least 8 hours.      Component Value Date/Time   SDES  12/27/2022 1307    BLOOD SITE NOT SPECIFIED Performed at [facility], [address], [place], [place] [address]    SDES  12/27/2022 1307    BLOOD RIGHT ARM Performed at [facility], [address], [place], [place] [address]    SDES  12/27/2022 1307    BLOOD LEFT ARM Performed at [facility], [address], [place], [place] [address]    SPECREQUEST  12/27/2022 1307    BOTTLES DRAWN AEROBIC ONLY Blood Culture adequate volume Performed at [facility], [address] [place], [place] [address]    SPECREQUEST  12/27/2022 1307    BOTTLES DRAWN AEROBIC AND ANAEROBIC Blood Culture adequate volume Performed at [facility], [address], [place], [place] [address]    SPECREQUEST  12/27/2022 1307    BOTTLES DRAWN AEROBIC ONLY Blood Culture adequate volume Performed at [facility], [address], [place], [place] [address]    CULT  12/27/2022 1307    NO GROWTH < 24 HOURS Performed at [facility], [address], [place], [place] [address]    CULT  12/27/2022 1307    NO GROWTH < 24 HOURS Performed at [facility], [address] [place], [place] [address]    CULT  12/27/2022 1307    NO GROWTH < 24 HOURS Performed at [facility], [address] [place], [place] [address]    REPTSTATUS PENDING 12/27/2022 1307   REPTSTATUS PENDING 12/27/2022 1307   REPTSTATUS PENDING 12/27/2022 1307   DG CHEST PORT 1 VIEW  Result Date: 12/27/2022 CLINICAL DATA:  Fever EXAM: PORTABLE CHEST 1 VIEW COMPARISON:  12/22/2022 FINDINGS: Enteric tube extends below the diaphragm with distal tip  terminating within the third portion of the duodenum. Left chest port and right subclavian central venous catheter remain appropriately positioned. Normal heart size. Left greater than right bibasilar opacities with bilateral pleural effusions, slightly improved. No pneumothorax. IMPRESSION: Left greater than right bibasilar opacities with bilateral pleural effusions, slightly improved. Electronically Signed   By: Duanne Guess D.O.   On: 12/27/2022 13:43   VAS [place] LOWER EXTREMITY VENOUS (DVT)  Result Date: 12/27/2022  Lower Venous DVT Study Patient Name:  JULIEN BEERE  Date of Exam:   12/27/2022 Medical Rec #: [id number]         Accession #:    [id number] Date of Birth: Aug 21, 1975         Patient Gender: F Patient Age:   23 years Exam Location:  [facility] Procedure:      VAS [place] LOWER EXTREMITY VENOUS (DVT) Referring Phys: MICHAEL MACZIS --------------------------------------------------------------------------------  Indications: Pulmonary embolism.  Risk Factors: Confirmed PE. Limitations: Poor ultrasound/tissue interface, bandages, open wound and patient positioning. Comparison Study: 12/14/2022, 12/20/2022 - Negative for DVT. Performing Technologist: Chanda Busing RVT  Examination Guidelines: A complete evaluation includes B-mode imaging, spectral Doppler, color Doppler, and power Doppler as needed of all accessible portions of each vessel. Bilateral testing is considered an integral part of a complete examination. Limited examinations for reoccurring indications may be performed as noted.  The reflux portion of the exam is performed with the patient in reverse Trendelenburg.  +---------+---------------+---------+-----------+----------+--------------+ RIGHT    CompressibilityPhasicitySpontaneityPropertiesThrombus Aging +---------+---------------+---------+-----------+----------+--------------+ CFV      Full           Yes      Yes                                  +---------+---------------+---------+-----------+----------+--------------+ SFJ      Full                                                        +---------+---------------+---------+-----------+----------+--------------+ FV Prox  Full                                                        +---------+---------------+---------+-----------+----------+--------------+ FV Mid                  Yes      Yes                                 +---------+---------------+---------+-----------+----------+--------------+ FV DistalFull                                                        +---------+---------------+---------+-----------+----------+--------------+ PFV      Full                                                        +---------+---------------+---------+-----------+----------+--------------+ POP      Full           Yes      Yes                                 +---------+---------------+---------+-----------+----------+--------------+ PTV      Full                                                        +---------+---------------+---------+-----------+----------+--------------+ PERO     Full                                                        +---------+---------------+---------+-----------+----------+--------------+   +---------+---------------+---------+-----------+----------+--------------+ LEFT     CompressibilityPhasicitySpontaneityPropertiesThrombus Aging +---------+---------------+---------+-----------+----------+--------------+ CFV      Full           Yes  Yes                                 +---------+---------------+---------+-----------+----------+--------------+ SFJ      Full                                                        +---------+---------------+---------+-----------+----------+--------------+ FV Prox  Full                                                         +---------+---------------+---------+-----------+----------+--------------+ FV Mid   Full           Yes      Yes                                 +---------+---------------+---------+-----------+----------+--------------+ FV DistalFull           Yes      Yes                                 +---------+---------------+---------+-----------+----------+--------------+ PFV      Full                                                        +---------+---------------+---------+-----------+----------+--------------+ POP      Full           Yes      Yes                                 +---------+---------------+---------+-----------+----------+--------------+ PTV      Full                                                        +---------+---------------+---------+-----------+----------+--------------+ PERO     Full                                                        +---------+---------------+---------+-----------+----------+--------------+     Summary: RIGHT: - There is no evidence of deep vein thrombosis in the lower extremity. However, portions of this examination were limited- see technologist comments above.  - No cystic structure found in the popliteal fossa.  LEFT: - There is no evidence of deep vein thrombosis in the lower extremity. However, portions of this examination were limited- see technologist comments above.  - No cystic structure found in the popliteal fossa.  *See table(s) above for measurements and observations. Electronically signed by Coral Else MD  on 12/27/2022 at 12:34:10 PM.    Final    US Paracentesis  Result Date: 12/26/2022 INDICATION: Patient postoperative day 12 for exploratory laparotomy with small-bowel resection. Large volume ascites visualized on CT abdomen pelvis from 12/25/2022. Request received for diagnostic paracentesis EXAM: ULTRASOUND GUIDED  PARACENTESIS MEDICATIONS: 10 cc 1% lidocaine COMPLICATIONS: None immediate. PROCEDURE: Informed  written consent was obtained from the patient after a discussion of the risks, benefits and alternatives to treatment. A timeout was performed prior to the initiation of the procedure. Initial ultrasound scanning demonstrates a large amount of ascites within the right lower abdominal quadrant. The right lower abdomen was prepped and draped in the usual sterile fashion. 1% lidocaine was used for local anesthesia. Following this, a 19 gauge, 7-cm, Yueh catheter was introduced. An ultrasound image was saved for documentation purposes. The paracentesis was performed. The catheter was removed and a dressing was applied. The patient tolerated the procedure well without immediate post procedural complication. FINDINGS: A total of approximately 500 mL of clear yellow fluid was removed. Samples were sent to the laboratory as requested by the clinical team. IMPRESSION: Successful ultrasound-guided paracentesis yielding 500 mL of peritoneal fluid. Procedure performed by Mina Marble, PA-C Electronically Signed   By: Marliss Coots M.D.   On: 12/26/2022 20:28   Recent Results (from the past 240 hour(s))  MRSA Next Gen by PCR, Nasal     Status: None   Collection Time: 12/19/22  8:58 AM   Specimen: Nasal Mucosa; Nasal Swab  Result Value Ref Range Status   MRSA by PCR Next Gen NOT DETECTED NOT DETECTED Final    Comment: (NOTE) The GeneXpert MRSA Assay (FDA approved for NASAL specimens only), is one component of a comprehensive MRSA colonization surveillance program. It is not intended to diagnose MRSA infection nor to guide or monitor treatment for MRSA infections. Test performance is not FDA approved in patients less than 30 years old. Performed at [facility], [address], [place], [place] [address]   Culture, Respiratory w Gram Stain     Status: None   Collection Time: 12/19/22  9:52 AM   Specimen: Tracheal Aspirate; Respiratory  Result Value Ref Range Status   Specimen Description    Final    TRACHEAL ASPIRATE Performed at [facility], [address] [place], [place] [address]    Special Requests   Final    Immunocompromised Performed at [facility], [address], [place], [place] [address]    Gram Stain   Final    FEW WBC PRESENT, PREDOMINANTLY PMN NO ORGANISMS SEEN    Culture   Final    Normal respiratory flora-no Staph aureus or Pseudomonas seen Performed at [facility], [address], [place], [place] [address]    Report Status 12/21/2022 FINAL  Final  Aerobic/Anaerobic Culture w Gram Stain (surgical/deep wound)     Status: None (Preliminary result)   Collection Time: 12/26/22  3:07 PM   Specimen: PATH Cytology Peritoneal fluid  Result Value Ref Range Status   Specimen Description   Final    PERITONEAL Performed at [facility], [address] [place], [place] [address]    Special Requests   Final    NONE Performed at [facility], [address], [place], [place] [address]    Gram Stain   Final    FEW WBC PRESENT,BOTH PMN AND MONONUCLEAR NO ORGANISMS SEEN    Culture   Final  NO GROWTH 2 DAYS Performed at [facility], [address], [place], [place] [address]    Report Status PENDING  Incomplete  Fungus culture, blood     Status: None (Preliminary result)   Collection Time: 12/27/22  1:07 PM   Specimen: BLOOD  Result Value Ref Range Status   Specimen Description   Final    BLOOD SITE NOT SPECIFIED Performed at [facility], [address], [place], [place] [address]    Special Requests   Final    BOTTLES DRAWN AEROBIC ONLY Blood Culture adequate volume Performed at [facility], [address] [place], [place] [address]    Culture   Final    NO GROWTH < 24 HOURS Performed at [facility], [address], [place], [place] [address]    Report Status PENDING  Incomplete  Culture, blood (Routine X  2) w Reflex to ID Panel     Status: None (Preliminary result)   Collection Time: 12/27/22  1:07 PM   Specimen: BLOOD RIGHT ARM  Result Value Ref Range Status   Specimen Description   Final    BLOOD RIGHT ARM Performed at [facility], [address] [place], [place] [address]    Special Requests   Final    BOTTLES DRAWN AEROBIC AND ANAEROBIC Blood Culture adequate volume Performed at [facility], [address], [place], [place] [address]    Culture   Final    NO GROWTH < 24 HOURS Performed at [facility], [address], [place], [place] [address]    Report Status PENDING  Incomplete  Culture, blood (Routine X 2) w Reflex to ID Panel     Status: None (Preliminary result)   Collection Time: 12/27/22  1:07 PM   Specimen: BLOOD LEFT ARM  Result Value Ref Range Status   Specimen Description   Final    BLOOD LEFT ARM Performed at [facility], [address] [place], [place] [address]    Special Requests   Final    BOTTLES DRAWN AEROBIC ONLY Blood Culture adequate volume Performed at [facility], [address], [place], [place] [address]    Culture   Final    NO GROWTH < 24 HOURS Performed at [facility], [address], [place], [place] [address]    Report Status PENDING  Incomplete    Microbiology: Recent Results (from the past 240 hour(s))  MRSA Next Gen by PCR, Nasal     Status: None   Collection Time: 12/19/22  8:58 AM   Specimen: Nasal Mucosa; Nasal Swab  Result Value Ref Range Status   MRSA by PCR Next Gen NOT DETECTED NOT DETECTED Final    Comment: (NOTE) The GeneXpert MRSA Assay (FDA approved for NASAL specimens only), is one component of a comprehensive MRSA colonization surveillance program. It is not intended to diagnose MRSA infection nor to guide or monitor treatment for MRSA infections. Test performance is not FDA approved in patients less than 59 years old. Performed at [facility], [address] [place], [place] [address]   Culture, Respiratory w Gram Stain     Status: None   Collection Time: 12/19/22  9:52 AM   Specimen: Tracheal Aspirate; Respiratory  Result Value Ref Range Status   Specimen Description   Final    TRACHEAL ASPIRATE Performed at [facility], [address] [place],  [place] [address]    Special Requests   Final    Immunocompromised Performed at [facility], [address] [place], [place] [address]    Gram Stain   Final    FEW WBC PRESENT, PREDOMINANTLY PMN NO ORGANISMS SEEN    Culture   Final    Normal respiratory flora-no Staph aureus or Pseudomonas seen Performed at [facility], [address], [place], [place] [address]    Report Status 12/21/2022 FINAL  Final  Aerobic/Anaerobic Culture w Gram Stain (surgical/deep wound)     Status: None (Preliminary result)   Collection Time: 12/26/22  3:07 PM   Specimen: PATH Cytology Peritoneal fluid  Result Value Ref Range Status   Specimen Description   Final    PERITONEAL Performed at [facility], [address] [place], [place] [address]    Special Requests   Final    NONE Performed at [facility], [address], [place], [place] [address]    Gram Stain   Final    FEW WBC PRESENT,BOTH PMN AND MONONUCLEAR NO ORGANISMS SEEN    Culture   Final    NO GROWTH 2 DAYS Performed at [facility], [address] [place], [place] [address]    Report Status PENDING  Incomplete  Fungus culture, blood     Status: None (Preliminary result)   Collection Time: 12/27/22  1:07 PM   Specimen: BLOOD  Result Value Ref Range Status   Specimen Description   Final    BLOOD SITE NOT SPECIFIED Performed at [facility], [address], [place], [place] [address]    Special Requests   Final    BOTTLES DRAWN AEROBIC ONLY Blood Culture adequate volume Performed at [facility], [address], [place], [place] [address]    Culture   Final    NO GROWTH < 24 HOURS Performed at [facility], [address], [place], [place] [address]    Report Status PENDING  Incomplete  Culture, blood (Routine X 2) w Reflex to ID Panel     Status: None (Preliminary result)   Collection Time: 12/27/22  1:07 PM   Specimen: BLOOD RIGHT ARM  Result Value Ref Range Status   Specimen Description   Final    BLOOD RIGHT ARM Performed at [facility], [address], [place], [place] [address]    Special Requests   Final    BOTTLES DRAWN AEROBIC AND ANAEROBIC Blood Culture adequate volume Performed at [facility], [address], [place], [place] [address]    Culture   Final    NO GROWTH < 24 HOURS Performed at [facility], [address] [place], [place] [address]    Report Status PENDING  Incomplete  Culture, blood (Routine X 2) w Reflex to ID Panel     Status: None (Preliminary result)   Collection Time: 12/27/22  1:07 PM   Specimen: BLOOD LEFT ARM  Result Value Ref Range Status   Specimen Description   Final    BLOOD LEFT ARM Performed at [facility], [address] [place], [place] [address]    Special Requests   Final    BOTTLES DRAWN AEROBIC ONLY Blood Culture adequate volume Performed at [facility], [address] [place], [place] [address]    Culture   Final    NO GROWTH < 24 HOURS Performed  at [facility], [address] [place], [place] [address]    Report Status PENDING  Incomplete    Radiographs and labs were personally reviewed by me.   Johny Sax, MD [facility] [phone] 12/28/2022, 12:22 PM

## 2022-12-28 NOTE — Progress Notes (Signed)
BUE venous duplex has been completed.   Results can be found under chart review under CV PROC. 12/28/2022 5:00 PM Andrick Rust RVT, RDMS

## 2022-12-28 NOTE — Plan of Care (Signed)
  Problem: Education: Goal: Ability to describe self-care measures that may prevent or decrease complications (Diabetes Survival Skills Education) will improve Outcome: Progressing   Problem: Activity: Goal: Risk for activity intolerance will decrease Outcome: Progressing   Problem: Coping: Goal: Level of anxiety will decrease Outcome: Progressing   Problem: Pain Managment: Goal: General experience of comfort will improve Outcome: Progressing

## 2022-12-28 NOTE — Plan of Care (Signed)
  Problem: Education: Goal: Ability to describe self-care measures that may prevent or decrease complications (Diabetes Survival Skills Education) will improve Outcome: Progressing Goal: Individualized Educational Video(s) Outcome: Progressing   Problem: Coping: Goal: Ability to adjust to condition or change in health will improve Outcome: Progressing   Problem: Fluid Volume: Goal: Ability to maintain a balanced intake and output will improve Outcome: Progressing   Problem: Nutritional: Goal: Maintenance of adequate nutrition will improve Outcome: Progressing Goal: Progress toward achieving an optimal weight will improve Outcome: Progressing   Problem: Coping: Goal: Level of anxiety will decrease Outcome: Progressing   Problem: Pain Managment: Goal: General experience of comfort will improve Outcome: Progressing

## 2022-12-28 NOTE — Progress Notes (Signed)
PROGRESS NOTE  [name]  DOB: 1976-01-18  PCP: Ollen Bowl, MD [id number]  DOA: 12/12/2022  LOS: 15 days  Hospital Day: 17  Brief narrative: [name] is a 47 y.o. female with PMH significant for Crohn's disease with previous bowel obstruction s/p subtotal colectomy 2021, colonic stricture, IDA, breast cancer s/p mastectomy 2022 currently not on treatment. Recently hospitalized 8/10-8/15/2024 for bowel obstruction seen by GI and general surgery. Underwent EGD and flexible sigmoidoscopy and discharged on steroids.  9/3, patient presented to the ED with abdominal distention, pain, nausea, vomiting CT abd/pelvis showed markedly dilated distal small bowel consistent with chronic obstruction. No changes since prior study.  Patient was admitted to [facility] with a plan of GI consultation.  9/4, in the morning, within few hours of admission, patient was found pulseless on the floor in the bathroom in ED, estimated downtime about 5 minutes. ACLS was initiated. Concern for possible torsades de pointes and given IV mag, defibrillated.  ROSC achieved in about 5 minutes. She was intubated. She had copious vomitus in the ETT.  Notably, she had I-stat peri-code which showed hemoglobin of 5.5 down from 10 overnight.  Admitted to ICU In the ICU, patient had right femoral CVC placed.  Given 3 minutes of PRBC, 3 units of FFP.  Patient was started on vasopressors  9/5, underwent exploratory laparotomy.  Noted to have internal hernia with associated small bowel ischemia.  Underwent bowel resection. 9/7, back to OR for ostomy placement 9/8, weaned off Levophed 9/10, extubated 9/10, found to have PE in CT angio chest.  Started on heparin drip which was subsequently stopped the next day because of drop in hemoglobin 9/12, mental status improved, became more interactive.  However failed swallow eval. 9/16, transferred out to [facility] 9/16, CT abdomen showed peritoneal fluid collection 9/17,  underwent paracentesis, 500 cc of clear yellow fluid drained  Subjective: Patient was seen and examined this morning..   Lying on bed.  Still has trouble with voice. Started on gentle tube feeding yesterday. Remains cardiac and febrile persistently. ID involved today.  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 Hillery Hunter s/p ex lap, ileostomy, abdominal closure 9/7 Dr. Hillery Hunter H/o Crohn's disease s/p prior subtotal colectom Currently only has 100 cm of small bowel left.  High ostomy output.   TPN dependent To slow down ostomy output, patient is currently on Imodium, iron, FiberCon, Imodium General Surgery following.   Given significant output, tachycardia, hypotension and AKI-patient is also on NS at 100 mL/h.  I will continue NS at 50 mL/h for now.  Persistent fever Patient continues to have persistent fever. Extensive workup done.  Blood cultures sent yesterday.  Fungal culture sent with as well. ID consulted today.  Noted addition of micafungin.  Continue Zosyn. Recent Labs  Lab 12/25/22 0012 12/25/22 1212 12/26/22 0500 12/27/22 0359 12/28/22 0346  WBC 23.6* 25.7* 27.4* 23.6* 21.0*   Dysphagia Extubated 9/10.  Seen by SLP since then.  Has failed swallow eval.  SLP to follow today again. Not on tube feeding to avoid worsening ostomy output  Pulmonary embolism Left lower lobe segmental Holding heparin given persistent intermittent bleeding from surgical site, worsening anemia Continue SCD boots  AKI Likely due to ATN related to septic shock. Improving currently. Recent Labs    12/19/22 0502 12/20/22 3664 12/21/22 1027 12/22/22 0445 12/23/22 0803 12/24/22 0408 12/25/22 4034 12/26/22 0500 12/27/22 7425 12/28/22 9563  BUN 39* 45* 51* 53* 57* 52* 51* 57* 53* 48*  CREATININE 1.71* 1.67* 1.40* 1.49* 1.61* 1.48* 1.41* 1.76* 1.47* 1.46*   Acute on chronic anemia Hemoglobin trend as  below.  It was 6.9 today.  1 unit of PRBC transfusion given. Received a total of 8 units of PRBC so far. Recent Labs    11/20/22 0252 11/21/22 0308 12/25/22 0012 12/25/22 1212 12/26/22 0500 12/27/22 0359 12/28/22 0346  HGB 8.9*   < > 6.7* 7.1* 6.9* 8.2* 7.6*  MCV 79.2*   < > 91.2 90.5 90.0 89.4 91.4  VITAMINB12 1,244*  --   --   --   --   --   --   FOLATE 16.2  --   --   --   --   --   --   FERRITIN 160  --   --   --   --   --   --   TIBC 118*  --   --   --   --   --   --   IRON 22*  --   --   --   --   --   --   RETICCTPCT 0.9  --   --   --   --   --   --    < > = values in this interval not displayed.   Hyperglycemia: A1c 5.7 on 9//24 Continue SSI/Accu-Cheks Recent Labs  Lab 12/27/22 2232 12/28/22 0632 12/28/22 0812 12/28/22 1223 12/28/22 1546  GLUCAP 112* 130* 116* 99 82    Recurrent breast cancer: Follows up with oncology Dr. Pamelia Hoit   Mobility: Encourage ambulation once hemodynamically stabilizes  Goals of care   Code Status: Full Code     DVT prophylaxis:  Place and maintain sequential compression device Start: 12/15/22 1031 SCDs Start: 12/13/22 0413   Antimicrobials: IV Zosyn. Completed course of IV vancomycin Fluid: TPN, NS at 50 mL/h Consultants: General surgery, ID Family Communication: Daughter not at bedside today  Status: Inpatient Level of care:  Stepdown   Patient is from: Home Needs to continue in-hospital care: Remains hypotensive, tachycardic, has significant output from ostomy Anticipated d/c to: Pending clinical course   Diet:  Diet Order             Diet NPO time specified Except for: Other (See Comments)  Diet effective now                   Scheduled Meds:  sodium chloride   Intravenous Once   acetaminophen (TYLENOL) oral liquid 160 mg/5 mL  1,000 mg Per Tube TID   Chlorhexidine Gluconate Cloth  6 each Topical Q2200   cholecalciferol  400 Units Per Tube Daily   famotidine  20 mg Per Tube Daily   feeding supplement  (VITAL AF 1.2 CAL)  1,000 mL Per Tube Q24H   ferrous sulfate  300 mg Per Tube BID   fiber  1 packet Per Tube BID   gabapentin  200 mg Per Tube Q8H   insulin aspart  0-9 Units Subcutaneous 4 times per day   loperamide HCl  4 mg Per Tube QID   metoprolol tartrate  25 mg Per Tube BID   sodium chloride flush  10-40 mL Intracatheter Q12H   vitamin E  100 Units Per Tube Daily    PRN meds: albuterol, artificial tears, fentaNYL (SUBLIMAZE) injection, labetalol, lip balm, LORazepam, [DISCONTINUED] ondansetron **OR** ondansetron (ZOFRAN) IV, mouth rinse, oxyCODONE, silver nitrate applicators, sodium  chloride flush   Infusions:   sodium chloride 50 mL/hr at 12/28/22 1021   micafungin (MYCAMINE) 100 mg in sodium chloride 0.9 % 100 mL IVPB 100 mg (12/28/22 1648)   phytonadione (VITAMIN K) 2 mg in dextrose 5 % 50 mL IVPB 2 mg (12/28/22 1542)   piperacillin-tazobactam (ZOSYN)  IV 3.375 g (12/28/22 1447)   TPN CYCLIC-ADULT (ION)      Antimicrobials: Anti-infectives (From admission, onward)    Start     Dose/Rate Route Frequency Ordered Stop   12/28/22 1430  micafungin (MYCAMINE) 100 mg in sodium chloride 0.9 % 100 mL IVPB        100 mg 105 mL/hr over 1 Hours Intravenous Every 24 hours 12/28/22 1335     12/23/22 2000  vancomycin (VANCOREADY) IVPB 1250 mg/250 mL  Status:  Discontinued        1,250 mg 166.7 mL/hr over 90 Minutes Intravenous Every 36 hours 12/22/22 0612 12/22/22 1103   12/22/22 0645  vancomycin (VANCOREADY) IVPB 1250 mg/250 mL        1,250 mg 166.7 mL/hr over 90 Minutes Intravenous  Once 12/22/22 0551 12/22/22 0805   12/22/22 0600  piperacillin-tazobactam (ZOSYN) IVPB 3.375 g        3.375 g 12.5 mL/hr over 240 Minutes Intravenous Every 8 hours 12/22/22 0551     12/13/22 1000  piperacillin-tazobactam (ZOSYN) IVPB 3.375 g  Status:  Discontinued        3.375 g 12.5 mL/hr over 240 Minutes Intravenous Every 8 hours 12/13/22 0833 12/20/22 1333       Objective: Vitals:   12/28/22  1000 12/28/22 1100  BP: (!) 142/86 108/66  Pulse: (!) 146 (!) 131  Resp: (!) 31 (!) 26  Temp: (!) 101.3 F (38.5 C) (!) 100.9 F (38.3 C)  SpO2: 100% 97%    Intake/Output Summary (Last 24 hours) at 12/28/2022 1652 Last data filed at 12/28/2022 1228 Gross per 24 hour  Intake 3261.34 ml  Output 4025 ml  Net -763.66 ml   Filed Weights   12/26/22 0500 12/27/22 0408 12/28/22 0500  Weight: 68.3 kg 61.8 kg 63.4 kg   Weight change: 1.6 kg Body mass index is 23.99 kg/m.   Physical Exam: General exam: Pleasant, [name].  Not in pain at the time of my evaluation Skin: No rashes, lesions or ulcers. HEENT: Atraumatic, normocephalic, no obvious bleeding.  Dobbhoff tube remains clamped Lungs: Clear to auscultation bilaterally CVS: Has persistent tachycardia, regular rhythm, no murmur GI/Abd soft, positive tenderness present, incision site care per general surgery, ostomy bag attached to drain CNS: Alert, awake, oriented x 3 Psychiatry: Sad affect Extremities: No pedal edema, no calf tenderness  Data Review: I have personally reviewed the laboratory data and studies available.  F/u labs ordered Unresulted Labs (From admission, onward)     Start     Ordered   12/28/22 0500  Vitamin A  Tomorrow morning,   R       Question:  Specimen collection method  Answer:  Unit=Unit collect   12/27/22 1103   12/28/22 0500  Vitamin E  Tomorrow morning,   R       Question:  Specimen collection method  Answer:  Unit=Unit collect   12/27/22 1103   12/28/22 0500  Vitamin K1, Serum  Tomorrow morning,   R       Question:  Specimen collection method  Answer:  Unit=Unit collect   12/27/22 1103   12/27/22 0845  Urinalysis, Routine w reflex  microscopic -Urine, Clean Catch  Once,   R       Question:  Specimen Source  Answer:  Urine, Clean Catch   12/27/22 0902   12/27/22 0844  Expectorated Sputum Assessment w Gram Stain, Rflx to Resp Cult  Once,   R        12/27/22 0902    12/25/22 0600  Comprehensive metabolic panel  Every Mon,Thu,   R     Question:  Specimen collection method  Answer:  Unit=Unit collect   12/21/22 0846   12/22/22 0544  Culture, Respiratory w Gram Stain  ONCE - URGENT,   URGENT        12/22/22 0544   12/20/22 0500  CBC  Daily,   R     Comments: While on heparin drip   Question:  Specimen collection method  Answer:  Unit=Unit collect   12/19/22 1720   12/18/22 0500  Magnesium  (Standard TPN Labs (all labs to be drawn at 0500))  Every Mon,Thu (0500),   R     Question:  Specimen collection method  Answer:  Unit=Unit collect   12/17/22 1152   12/18/22 0500  Phosphorus  (Standard TPN Labs (all labs to be drawn at 0500))  Every Mon,Thu (0500),   R     Question:  Specimen collection method  Answer:  Unit=Unit collect   12/17/22 1152   12/14/22 0000  Prealbumin  Weekly,   R      12/13/22 1839            Total time spent in review of labs and imaging, patient evaluation, formulation of plan, documentation and communication with family: 45 minutes  Signed, Lorin Glass, MD [facility] 12/28/2022

## 2022-12-28 NOTE — Progress Notes (Addendum)
PHARMACY - TOTAL PARENTERAL NUTRITION CONSULT NOTE   Indication: Short bowel syndrome  Patient Measurements: Height: 5\' 4"  (162.6 cm) Weight: 63.4 kg (139 lb 12.4 oz) IBW/kg (Calculated) : 54.7   Body mass index is 23.99 kg/m. Usual Weight: 46.3 kg from 8/28  Assessment: 47 y.o. female with PMH of recurrent breast cancer not currently on treatment, Crohn's disease s/p subtotal colectomy in 2021, recurrent obstructions, colonic stricture, recent hospitalization 8/10-8/15/2024 for obstruction who presented on 12/12/2022 with abdominal pain, nausea, vomiting diarrhea.    9/4 Admit; found down in ED bathroom with no pulse, CPR > intubated 9/5 s/p ex-lap, found to have internal hernia with small bowel ischemia, only 105 cm of small bowel remaining OR 9/7 for closure, ostomy creation 9/8 pharmacy consulted to manage TPN 9/15 Surgery requesting transition to cyclic TPN  Glucose / Insulin: no Hx DM - steroids PTA for Crohn's disease; weaned off as of 9/13 - CBGs goal < 180. Range: 90 -143. 1u SSI required/24 hrs Electrolytes: All WNL, including CorrCa (9.5) Renal: Baseline SCr <1. AKI s/p arrest, SCr/BUN remain elevated but fairly stable Hepatic: LFT, T.bili WNL. Albumin remains low  - TG (9/11) normalized after stopping propofol I/O:  -mIVF: NaCl @ 50 mL/h -UOP: 1300 mL, ileostomy: 2050 mL - getting fiber, iron, loperamide per tube GI Imaging:  - CTA 9/10 shows new PE, but also abdominal ascites - CT Abd: Large volume ascites GI Surgeries / Procedures:  - OR 9/5 ex lap, internal hernia w/ small bowel ischemia, SBR & celiotomy; > 105 cm small bowel remaining; - OR 9/7 for closure, ostomy creation -9/17: Paracentesis  Central access: triple lumen CVC R subclavian 9/4, triple lumen CVC R femoral 9/4;  PAC 05/02/22 TPN start date: 12/17/22  Nutritional Goals: TPN provides 95 g protein and 1737 kcal per day  RD Assessment: Estimated Needs Total Energy Estimated Needs: 1600-1800 Total  Protein Estimated Needs: 85-95 grams Total Fluid Estimated Needs: 1.6L/day  Current Nutrition:  Trickle TF TPN   CCS MD requesting repletion of vitamins ADEK.  -Vitamin A level pending prior to supplementation -Vitamin K IV weekly, Vitamin D and E per tube  Plan: Advance TPN cycle to 12 hr cycle starting at 18:00 tonight Anticipate prolonged/indefinite TPN dependency Max glucose infusion rate 6.03 mg/kg/min Electrolytes in TPN: No change Na - 80 mEq/L K - 55 mEq/L Ca - 5 mEq/L Mg - 10 mEq/L Phos - 15 mmol/L Cl:Ac ratio - 1:2 Add standard MVI and trace elements to TPN Chromium on hold d/t critical shortage Continue CBG checks 4x daily while cycling TPN On TPN Off TPN 1-2 hr after completing ramp-up 1 hr after TPN stopped MIVF per primary Monitor TPN labs on Mon/Thurs.  Cindi Carbon, PharmD 12/28/22 10:44 AM

## 2022-12-28 NOTE — Progress Notes (Signed)
12 Days Post-Op  Subjective: CC: Some lower abdominal pain yesterday after working with therapies. Resolved after pain medication. Denies any abdominal pain this morning. Back on TF's and had a small amount of PO with slp (see their note). No nausea or vomiting. Having ostomy output. - 2050cc/24 hours.  Febrile to 101.7 over the last 24 hours. Tachycardic still on scheduled metoprolol, 25 mg BID. BP has improved with last pressure 143/85 Hgb 7.6 from 8.2 WBC 27.4 > 26.6 > 21.   Objective: Vital signs in last 24 hours: Temp:  [99.7 F (37.6 C)-101.7 F (38.7 C)] 101.1 F (38.4 C) (09/19 0600) Pulse Rate:  [109-142] 139 (09/19 0600) Resp:  [17-29] 29 (09/19 0600) BP: (104-143)/(58-85) 143/85 (09/19 0600) SpO2:  [96 %-100 %] 100 % (09/19 0600) Weight:  [63.4 kg] 63.4 kg (09/19 0500) Last BM Date : 12/27/22  Intake/Output from previous day: 09/18 0701 - 09/19 0700 In: 3446.9 [I.V.:2952.6; NG/GT:335.3; IV Piggyback:159] Out: 3350 [Urine:1300; Stool:2050] Intake/Output this shift: No intake/output data recorded.  PE: Gen:  Alert, pleasant Card:  Tachycardic Pulm: Rate and effort normal Abd: Soft, mild distension, mild left sided ttp without rigidity or guarding on exam. Otherwise NT.  Ostomy pink, budded and viable with liquid stool in ostomy bag. Midline wound with vac in place with good seal - small amount of SS output in cannister  GU: Foley draining straw colored urine.  LE: No LE edema  Lab Results:  Recent Labs    12/27/22 0359 12/28/22 0346  WBC 23.6* 21.0*  HGB 8.2* 7.6*  HCT 24.5* 23.3*  PLT 260 280   BMET Recent Labs    12/27/22 0823 12/28/22 0346  NA 140 137  K 4.2 4.0  CL 106 105  CO2 22 23  GLUCOSE 134* 143*  BUN 53* 48*  CREATININE 1.47* 1.46*  CALCIUM 7.8* 7.5*   PT/INR No results for input(s): "LABPROT", "INR" in the last 72 hours.  CMP     Component Value Date/Time   NA 137 12/28/2022 0346   NA 135 01/21/2020 1056   K 4.0 12/28/2022  0346   CL 105 12/28/2022 0346   CO2 23 12/28/2022 0346   GLUCOSE 143 (H) 12/28/2022 0346   BUN 48 (H) 12/28/2022 0346   BUN 12 01/21/2020 1056   CREATININE 1.46 (H) 12/28/2022 0346   CREATININE 0.91 09/21/2022 1141   CALCIUM 7.5 (L) 12/28/2022 0346   PROT 4.8 (L) 12/28/2022 0346   ALBUMIN <1.5 (L) 12/28/2022 0346   AST 23 12/28/2022 0346   AST 18 09/21/2022 1141   ALT 19 12/28/2022 0346   ALT 12 09/21/2022 1141   ALKPHOS 126 12/28/2022 0346   BILITOT 0.7 12/28/2022 0346   BILITOT 0.4 09/21/2022 1141   GFRNONAA 45 (L) 12/28/2022 0346   GFRNONAA >60 09/21/2022 1141   GFRAA 108 01/21/2020 1056   Lipase     Component Value Date/Time   LIPASE 762 (H) 12/21/2022 0305    Studies/Results: DG CHEST PORT 1 VIEW  Result Date: 12/27/2022 CLINICAL DATA:  Fever EXAM: PORTABLE CHEST 1 VIEW COMPARISON:  12/22/2022 FINDINGS: Enteric tube extends below the diaphragm with distal tip terminating within the third portion of the duodenum. Left chest port and right subclavian central venous catheter remain appropriately positioned. Normal heart size. Left greater than right bibasilar opacities with bilateral pleural effusions, slightly improved. No pneumothorax. IMPRESSION: Left greater than right bibasilar opacities with bilateral pleural effusions, slightly improved. Electronically Signed   By: Duanne Guess  D.O.   On: 12/27/2022 13:43   VAS [place] LOWER EXTREMITY VENOUS (DVT)  Result Date: 12/27/2022  Lower Venous DVT Study Patient Name:  KENDRALYN COOTS  Date of Exam:   12/27/2022 Medical Rec #: [id number]         Accession #:    [id number] Date of Birth: July 16, 1975         Patient Gender: F Patient Age:   25 years Exam Location:  [facility] Procedure:      VAS [place] LOWER EXTREMITY VENOUS (DVT) Referring Phys: Deylan Canterbury --------------------------------------------------------------------------------  Indications: Pulmonary embolism.  Risk Factors: Confirmed PE. Limitations: Poor  ultrasound/tissue interface, bandages, open wound and patient positioning. Comparison Study: 12/14/2022, 12/20/2022 - Negative for DVT. Performing Technologist: Chanda Busing RVT  Examination Guidelines: A complete evaluation includes B-mode imaging, spectral Doppler, color Doppler, and power Doppler as needed of all accessible portions of each vessel. Bilateral testing is considered an integral part of a complete examination. Limited examinations for reoccurring indications may be performed as noted. The reflux portion of the exam is performed with the patient in reverse Trendelenburg.  +---------+---------------+---------+-----------+----------+--------------+ RIGHT    CompressibilityPhasicitySpontaneityPropertiesThrombus Aging +---------+---------------+---------+-----------+----------+--------------+ CFV      Full           Yes      Yes                                 +---------+---------------+---------+-----------+----------+--------------+ SFJ      Full                                                        +---------+---------------+---------+-----------+----------+--------------+ FV Prox  Full                                                        +---------+---------------+---------+-----------+----------+--------------+ FV Mid                  Yes      Yes                                 +---------+---------------+---------+-----------+----------+--------------+ FV DistalFull                                                        +---------+---------------+---------+-----------+----------+--------------+ PFV      Full                                                        +---------+---------------+---------+-----------+----------+--------------+ POP      Full           Yes      Yes                                 +---------+---------------+---------+-----------+----------+--------------+  PTV      Full                                                         +---------+---------------+---------+-----------+----------+--------------+ PERO     Full                                                        +---------+---------------+---------+-----------+----------+--------------+   +---------+---------------+---------+-----------+----------+--------------+ LEFT     CompressibilityPhasicitySpontaneityPropertiesThrombus Aging +---------+---------------+---------+-----------+----------+--------------+ CFV      Full           Yes      Yes                                 +---------+---------------+---------+-----------+----------+--------------+ SFJ      Full                                                        +---------+---------------+---------+-----------+----------+--------------+ FV Prox  Full                                                        +---------+---------------+---------+-----------+----------+--------------+ FV Mid   Full           Yes      Yes                                 +---------+---------------+---------+-----------+----------+--------------+ FV DistalFull           Yes      Yes                                 +---------+---------------+---------+-----------+----------+--------------+ PFV      Full                                                        +---------+---------------+---------+-----------+----------+--------------+ POP      Full           Yes      Yes                                 +---------+---------------+---------+-----------+----------+--------------+ PTV      Full                                                        +---------+---------------+---------+-----------+----------+--------------+  PERO     Full                                                        +---------+---------------+---------+-----------+----------+--------------+     Summary: RIGHT: - There is no evidence of deep vein thrombosis in the lower extremity. However, portions of  this examination were limited- see technologist comments above.  - No cystic structure found in the popliteal fossa.  LEFT: - There is no evidence of deep vein thrombosis in the lower extremity. However, portions of this examination were limited- see technologist comments above.  - No cystic structure found in the popliteal fossa.  *See table(s) above for measurements and observations. Electronically signed by Coral Else MD on 12/27/2022 at 12:34:10 PM.    Final    US Paracentesis  Result Date: 12/26/2022 INDICATION: Patient postoperative day 12 for exploratory laparotomy with small-bowel resection. Large volume ascites visualized on CT abdomen pelvis from 12/25/2022. Request received for diagnostic paracentesis EXAM: ULTRASOUND GUIDED  PARACENTESIS MEDICATIONS: 10 cc 1% lidocaine COMPLICATIONS: None immediate. PROCEDURE: Informed written consent was obtained from the patient after a discussion of the risks, benefits and alternatives to treatment. A timeout was performed prior to the initiation of the procedure. Initial ultrasound scanning demonstrates a large amount of ascites within the right lower abdominal quadrant. The right lower abdomen was prepped and draped in the usual sterile fashion. 1% lidocaine was used for local anesthesia. Following this, a 19 gauge, 7-cm, Yueh catheter was introduced. An ultrasound image was saved for documentation purposes. The paracentesis was performed. The catheter was removed and a dressing was applied. The patient tolerated the procedure well without immediate post procedural complication. FINDINGS: A total of approximately 500 mL of clear yellow fluid was removed. Samples were sent to the laboratory as requested by the clinical team. IMPRESSION: Successful ultrasound-guided paracentesis yielding 500 mL of peritoneal fluid. Procedure performed by Mina Marble, PA-C Electronically Signed   By: Marliss Coots M.D.   On: 12/26/2022 20:28     Anti-infectives: Anti-infectives (From admission, onward)    Start     Dose/Rate Route Frequency Ordered Stop   12/23/22 2000  vancomycin (VANCOREADY) IVPB 1250 mg/250 mL  Status:  Discontinued        1,250 mg 166.7 mL/hr over 90 Minutes Intravenous Every 36 hours 12/22/22 0612 12/22/22 1103   12/22/22 0645  vancomycin (VANCOREADY) IVPB 1250 mg/250 mL        1,250 mg 166.7 mL/hr over 90 Minutes Intravenous  Once 12/22/22 0551 12/22/22 0805   12/22/22 0600  piperacillin-tazobactam (ZOSYN) IVPB 3.375 g        3.375 g 12.5 mL/hr over 240 Minutes Intravenous Every 8 hours 12/22/22 0551     12/13/22 1000  piperacillin-tazobactam (ZOSYN) IVPB 3.375 g  Status:  Discontinued        3.375 g 12.5 mL/hr over 240 Minutes Intravenous Every 8 hours 12/13/22 6073 12/20/22 1333        Assessment/Plan Hx Crohn's disease s/p subtotal colectomy  Internal hernia with bowel ischemia - POD 14 exploratory laparotomy with small bowel resection 9/5 Dr. Hillery Hunter - POD 12 s/p exploratory laparotomy, ileostomy, abdominal closure 9/7 Dr. 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 Highly likely to have high ostomy output  -  Cont TPN. Will likely need TPN at d/c - Imodium 2mg  BID, Iron 325mg  BID, Fibercon 625mg  BID started 9/11 to slow ileostomy output down.  Increased loperamide from 2 mg to 4 twice daily 9/14.  Increase loperamide to 4mg  QID 9/19. Will discuss with attending about adding Lomotil. May need intermittent fluid replacement for high ileostomy output.  - On trickle TF's. Will discuss with RD about timing of increasing.  - Diet per SLP. Currenlty on Nectar-thick liquid;Dysphagia 1 (puree) via tsp only - MBS tomorrow  - WOCN following for new ostomy. Plan ostomy clinic referral at d/c - Midline wound with hx of bleeding leading. Resolved. Cont Vac. Plan vac changes with WOCN M/W/F - CT A/P 9/16 done. No indication for return to the OR.  Large amount of ascites. S/p IR paracentesis yesterday with 500cc of clear yellow fluid. Cx pending. No growth to date.  - PT/OT - Palliative following peripherally currently.  - Updated patients daughter over speaker phone while I was in the room.   ID - Zosyn 9/4>>9/11. 9/13 >> Patient still febrile with elevated leukocytosis. CT A/P without clear source of fever and paracentesis yesterday with clear yellow fluid and cx w/ no growth to date. LE [place] 9/18 neg. UA pending. CXR w/ L > R opacities with b/l pleural effusions. Defer to [facility] if pleural effusions need further addressing. Resp Cx ordered. Bcx including specific fungal blood cx pending.   FEN - D1 per SLP as above. On trickle TF's. TPN VTE - SCDs, HEP GTT held for bleeding/ anemia.  Foley - Failed TOV. Foley in place with good UOP.   --per [facility]- PE - Noted on CTA 9/10. LE [place] negative for DVT. Heparin gtt currently on hold for ABL anemia.  If hgb stable in AM hopefully can restart Heparin with no bolus. Agree with CCM that we may need to consider IVC temporary filter at some point if unable to restart heparin gtt (hx breast ca, recent surgery, immobilized). Shock - off pressors VDRF - extubated  Recent cardiac arrest 9/3 AKI - Cr stable Anemia - Transfuse to keep hemoglobin above 7. S/p 1U PRBC 9/12, 2U 9/14, 1U 9/16, 1U 9/17. Hgb 7.6 from 8.2. Consider IV iron supplementation - she has short gut so worried she will not absorb iron well orally. Last PT-INR 9/15 was 1.3.  Hx metastatic breast cancer   LOS: 15 days    Jacinto Halim , [facility] Surgery 12/28/2022, 8:00 AM Please see Amion for pager number during day hours 7:00am-4:30pm

## 2022-12-29 ENCOUNTER — Inpatient Hospital Stay (HOSPITAL_COMMUNITY): Payer: Managed Care, Other (non HMO)

## 2022-12-29 DIAGNOSIS — I2699 Other pulmonary embolism without acute cor pulmonale: Secondary | ICD-10-CM

## 2022-12-29 DIAGNOSIS — D72829 Elevated white blood cell count, unspecified: Secondary | ICD-10-CM

## 2022-12-29 DIAGNOSIS — K90829 Short bowel syndrome, unspecified: Secondary | ICD-10-CM | POA: Diagnosis not present

## 2022-12-29 LAB — URINALYSIS, ROUTINE W REFLEX MICROSCOPIC
Bacteria, UA: NONE SEEN
Bilirubin Urine: NEGATIVE
Glucose, UA: NEGATIVE mg/dL
Ketones, ur: NEGATIVE mg/dL
Leukocytes,Ua: NEGATIVE
Nitrite: NEGATIVE
Protein, ur: 30 mg/dL — AB
Specific Gravity, Urine: 1.014 (ref 1.005–1.030)
pH: 7 (ref 5.0–8.0)

## 2022-12-29 LAB — CBC
HCT: 22.6 % — ABNORMAL LOW (ref 36.0–46.0)
Hemoglobin: 7.2 g/dL — ABNORMAL LOW (ref 12.0–15.0)
MCH: 29.8 pg (ref 26.0–34.0)
MCHC: 31.9 g/dL (ref 30.0–36.0)
MCV: 93.4 fL (ref 80.0–100.0)
Platelets: 275 10*3/uL (ref 150–400)
RBC: 2.42 MIL/uL — ABNORMAL LOW (ref 3.87–5.11)
RDW: 16.5 % — ABNORMAL HIGH (ref 11.5–15.5)
WBC: 18.2 10*3/uL — ABNORMAL HIGH (ref 4.0–10.5)
nRBC: 0 % (ref 0.0–0.2)

## 2022-12-29 LAB — GLUCOSE, CAPILLARY
Glucose-Capillary: 100 mg/dL — ABNORMAL HIGH (ref 70–99)
Glucose-Capillary: 112 mg/dL — ABNORMAL HIGH (ref 70–99)
Glucose-Capillary: 126 mg/dL — ABNORMAL HIGH (ref 70–99)
Glucose-Capillary: 127 mg/dL — ABNORMAL HIGH (ref 70–99)
Glucose-Capillary: 93 mg/dL (ref 70–99)

## 2022-12-29 MED ORDER — METRONIDAZOLE 500 MG/100ML IV SOLN
500.0000 mg | Freq: Two times a day (BID) | INTRAVENOUS | Status: DC
  Administered 2022-12-29 – 2023-01-16 (×37): 500 mg via INTRAVENOUS
  Filled 2022-12-29 (×37): qty 100

## 2022-12-29 MED ORDER — FOOD THICKENER (SIMPLYTHICK): 1.0000 | ORAL | Status: DC | PRN

## 2022-12-29 MED ORDER — ACETAMINOPHEN 500 MG PO TABS
1000.0000 mg | ORAL_TABLET | Freq: Three times a day (TID) | ORAL | Status: DC
  Administered 2022-12-29 – 2023-01-02 (×15): 1000 mg
  Filled 2022-12-29 (×15): qty 2

## 2022-12-29 MED ORDER — ZINC SULFATE 220 (50 ZN) MG PO CAPS
220.0000 mg | ORAL_CAPSULE | Freq: Every day | ORAL | Status: DC
  Administered 2022-12-30 – 2023-01-11 (×13): 220 mg
  Filled 2022-12-29 (×13): qty 1

## 2022-12-29 MED ORDER — CIPROFLOXACIN IN D5W 400 MG/200ML IV SOLN
400.0000 mg | Freq: Two times a day (BID) | INTRAVENOUS | Status: DC
  Administered 2022-12-29 – 2023-01-16 (×37): 400 mg via INTRAVENOUS
  Filled 2022-12-29 (×37): qty 200

## 2022-12-29 MED ORDER — KATE FARMS STANDARD 1.4 PO LIQD
325.0000 mL | Freq: Two times a day (BID) | ORAL | Status: DC
  Administered 2022-12-30: 325 mL via ORAL
  Administered 2022-12-31: 100 mL via ORAL
  Administered 2022-12-31: 50 mL via ORAL
  Administered 2023-01-01: 325 mL via ORAL
  Filled 2022-12-29 (×7): qty 325

## 2022-12-29 MED ORDER — KATE FARMS STANDARD 1.4 PO LIQD
180.0000 mL | Freq: Every day | ORAL | Status: DC
  Administered 2022-12-29 – 2023-01-01 (×4): 180 mL
  Filled 2022-12-29 (×4): qty 325

## 2022-12-29 MED ORDER — TRAVASOL 10 % IV SOLN
INTRAVENOUS | Status: AC
  Filled 2022-12-29: qty 940.8

## 2022-12-29 MED ORDER — VITAMIN C 500 MG PO TABS
500.0000 mg | ORAL_TABLET | Freq: Two times a day (BID) | ORAL | Status: DC
  Administered 2022-12-30 – 2023-01-11 (×26): 500 mg
  Filled 2022-12-29 (×26): qty 1

## 2022-12-29 NOTE — Progress Notes (Signed)
[facility]    Date of Admission:  12/12/2022      ID: [name] is a 47 y.o. 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   Malignant neoplasm of upper-outer quadrant of right breast in female, estrogen receptor positive (HCC)   Hypokalemia   Port-A-Cath in place   Breast cancer (HCC)   Crohn's disease of both small and large intestine with intestinal obstruction (HCC)   Chronic disease anemia   Protein-calorie malnutrition, severe (HCC)   Failure to thrive in adult   Anorexia   Cardiac arrest (HCC)   Septic shock (HCC)   SBO with strangulation s/p resection 12/14/2022   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  Otherwise no c/o Note of stool consistency change by nursing.   Medications:   sodium chloride   Intravenous Once   acetaminophen  1,000 mg Per Tube TID   cholecalciferol  400 Units Per Tube Daily   famotidine  20 mg Per Tube Daily   feeding supplement (VITAL AF 1.2 CAL)  1,000 mL Per Tube Q24H   ferrous sulfate  300 mg Per Tube BID   fiber  1 packet Per Tube BID   gabapentin  200 mg Per Tube Q8H   insulin aspart  0-9 Units Subcutaneous 4 times per day   loperamide HCl  4 mg Per Tube QID   metoprolol tartrate  25 mg Per Tube BID   sodium chloride flush  10-40 mL Intracatheter Q12H   vitamin E  100 Units Per Tube Daily    Objective: Vital signs in last 24 hours: Temp:  [99.3 F (37.4 C)-101.5 F (38.6 C)] 100.9 F (38.3 C) (09/20 0928) Pulse Rate:  [119-144] 140 (09/20 1100) Resp:  [20-30] 28 (09/20 0928) BP: (102-178)/(58-92) 134/83 (09/20 0900) SpO2:  [98 %-100 %] 100 % (09/20 0928) Weight:  [59.4 kg] 59.4 kg (09/20 0500)   General  appearance: alert, cooperative, and no distress Resp: clear to auscultation bilaterally Chest wall: no tenderness, port and TLC are clean, non-fluctuant.  Cardio: regularly irregular rhythm GI: BS+, soft, peri-incisional pain.   Lab Results Recent Labs    12/27/22 0823 12/28/22 0346 12/29/22 0529  WBC  --  21.0* 18.2*  HGB  --  7.6* 7.2*  HCT  --  23.3* 22.6*  NA 140 137  --   K 4.2 4.0  --   CL 106 105  --   CO2 22 23  --   BUN 53* 48*  --   CREATININE 1.47* 1.46*  --    Liver Panel Recent Labs    12/28/22 0346  PROT 4.8*  ALBUMIN <1.5*  AST 23  ALT 19  ALKPHOS 126  BILITOT 0.7   Sedimentation Rate No results for input(s): "ESRSEDRATE" in the last 72 hours. C-Reactive Protein No results for input(s): "CRP" in the last 72 hours.  Microbiology:  Studies/Results: VAS [place] UPPER EXTREMITY VENOUS DUPLEX  Result Date: 12/28/2022 UPPER VENOUS STUDY  Patient Name:  [name]  Date of Exam:   12/28/2022 Medical Rec #: [id number]         Accession #:    [id number] Date of  Birth: [birth year]         Patient Gender: F Patient Age:   50 years Exam Location:  [facility] Procedure:      VAS [place] UPPER EXTREMITY VENOUS DUPLEX Referring Phys: Daxtyn Rottenberg --------------------------------------------------------------------------------  Indications: PE not on anticoagulation Limitations: Line and port placement. Comparison Study: No previous UE exams Performing Technologist: Jody Hill RVT, RDMS  Examination Guidelines: A complete evaluation includes B-mode imaging, spectral Doppler, color Doppler, and power Doppler as needed of all accessible portions of each vessel. Bilateral testing is considered an integral part of a complete examination. Limited examinations for reoccurring indications may be performed as noted.  Right Findings: +----------+------------+---------+-----------+----------+--------------+ RIGHT     CompressiblePhasicitySpontaneousProperties   Summary      +----------+------------+---------+-----------+----------+--------------+ IJV           Full       Yes       Yes                             +----------+------------+---------+-----------+----------+--------------+ Subclavian                                          Not visualized +----------+------------+---------+-----------+----------+--------------+ Axillary      Full       Yes       Yes                             +----------+------------+---------+-----------+----------+--------------+ Brachial      Full       Yes       Yes                             +----------+------------+---------+-----------+----------+--------------+ Radial        Full                                                 +----------+------------+---------+-----------+----------+--------------+ Ulnar         Full                                                 +----------+------------+---------+-----------+----------+--------------+ Cephalic      Full                                                 +----------+------------+---------+-----------+----------+--------------+ Basilic       Full       Yes       Yes                             +----------+------------+---------+-----------+----------+--------------+ Unable to visualized subclavian vein due to central line placement and overlying dressing. Cephalic vein not well visualized due to size of vessel.  Left Findings: +----------+------------+---------+-----------+----------+--------------------+ LEFT      CompressiblePhasicitySpontaneousProperties      Summary        +----------+------------+---------+-----------+----------+--------------------+  IJV           Full       Yes       Yes                                   +----------+------------+---------+-----------+----------+--------------------+ Subclavian               Yes       Yes                   patent by                                                               color/doppler     +----------+------------+---------+-----------+----------+--------------------+ Axillary      Full       Yes       Yes                                   +----------+------------+---------+-----------+----------+--------------------+ Brachial      Full       Yes       Yes                                   +----------+------------+---------+-----------+----------+--------------------+ Radial        Full                                                       +----------+------------+---------+-----------+----------+--------------------+ Ulnar         Full                                                       +----------+------------+---------+-----------+----------+--------------------+ Cephalic      Full                                                       +----------+------------+---------+-----------+----------+--------------------+ Basilic       Full       Yes       Yes                                   +----------+------------+---------+-----------+----------+--------------------+ Limited visualization of subclavian vein due to port placement. Cephalic vein not well visualized due to size of vessel.  Summary: No evidence of deep vein or superficial vein thrombosis involving the right and left upper extremities. Right: However, unable to visualize the subclavian vein.  *See table(s) above for measurements and observations.    Preliminary      Assessment/Plan: Fever, leukocytosis ? Bowel Ischemia ?  Fungemia Crohn's disease AKI (prev 0.74 pta) PE Cardio-pulmonary arrest  Total days of antibiotics: 17 zosyn, vanco 8, micafungin 1  Fever unchanged Wbc down slightly Will change her zosyn to cipro/flagyl to r/o fever from zosyn QT ok TLC to be changed today.  Her u/e doppler was (-) Await her BCx (9-18) and peritoneal fluid Cx (9-17).  Dr Thedore Mins available over weekend.   [facility] Pager: [phone]  12/29/2022, 11:06 AM

## 2022-12-29 NOTE — Progress Notes (Signed)
Physical Therapy Treatment Patient Details Name: [name] MRN: [id number] DOB: 1975-05-03 Today's Date: 12/29/2022   History of Present Illness Patient is a 47 year old female who presented on 9/3 with abdominal distension and nausea/vomiting. 9/4 patient was found in ED bathroom with no pulse with ROSC in about 5 mins. Patient was intubated at this time. 9/5 patient underwent exploratory laparotomy that revealed internal hernia with associated small bowel ischemia with patient undergoing bowel resection. 9/7 patient returned to OR for ostomy placement. Patient was extubated on 9/10 and also found to have PE. PMH: breast cancer, Chron's disease, IDA, recent hospitalization 8/10 to 8/15 with bowel obstruction and EGD    PT Comments  Pt seen in ICU RM # [id number] General Comments: AxO x 3 pleasant and willing.  Soft to no voice Assisted OOB to recliner vis STEDY went well.  General bed mobility comments: requires assist with B knee flex due to weakness with increased time and cues for log rolling to prevent pressure on abdomen. patient needed physical A for trunk and BLE movement. General transfer comment: stood 3 times using STEDY and on last stand stood x 2 min in STEDY  Positioned in recliner to comfort. Educated and performed some B LE TE's of AP, knee presses and gluteal squeezes. Noted HgB 7.3 pt asymptomatic.  "Good to get out of that bed", stated pt.   LPT has rec Inp Rehab post Acute Hospital stay.     If plan is discharge home, recommend the following: A lot of help with walking and/or transfers;A lot of help with bathing/dressing/bathroom;Assistance with cooking/housework;Assist for transportation   Can travel by private vehicle        Equipment Recommendations  Rolling walker (2 wheels)    Recommendations for Other Services       Precautions / Restrictions Precautions Precautions: Fall Precaution Comments: abdominal precautions, wound vac, abominal binder, ostomy, nasal  tube Restrictions Weight Bearing Restrictions: No     Mobility  Bed Mobility Overal bed mobility: Needs Assistance Bed Mobility: Rolling Rolling: Max assist Sidelying to sit: Max assist, +2 for physical assistance, +2 for safety/equipment       General bed mobility comments: requires assist with B knee flex due to weakness with increased time and cues for log rolling to prevent pressure on abdomen. patient needed physical A for trunk and BLE movement.    Transfers Overall transfer level: Needs assistance Equipment used: 2 person hand held assist Transfers: Sit to/from Stand Sit to Stand: Mod assist, +2 physical assistance, +2 safety/equipment           General transfer comment: stood 3 times using STEDY and on last stand stood x 2 min in [employer] Transfer via Lift Equipment: Stedy  Ambulation/Gait               General Gait Details: pre gait static standing in [employer]             Wheelchair Mobility     Tilt Bed    Modified Rankin (Stroke Patients Only)       Balance                                            Cognition Arousal: Alert Behavior During Therapy: WFL for tasks assessed/performed Overall Cognitive Status: Within Functional Limits for tasks assessed  General Comments: AxO x 3 pleasant and willing.  Soft to no voice        Exercises      General Comments        Pertinent Vitals/Pain Pain Assessment Pain Assessment: Faces Faces Pain Scale: Hurts a little bit Pain Location: abdomen with activity Pain Descriptors / Indicators: Discomfort Pain Intervention(s): Monitored during session    Home Living                          Prior Function            PT Goals (current goals can now be found in the care plan section) Progress towards PT goals: Progressing toward goals    Frequency    Min 1X/week      PT Plan      Co-evaluation               AM-PAC PT "6 Clicks" Mobility   Outcome Measure  Help needed turning from your back to your side while in a flat bed without using bedrails?: A Lot Help needed moving from lying on your back to sitting on the side of a flat bed without using bedrails?: A Lot Help needed moving to and from a bed to a chair (including a wheelchair)?: Total Help needed standing up from a chair using your arms (e.g., wheelchair or bedside chair)?: Total Help needed to walk in hospital room?: Total Help needed climbing 3-5 steps with a railing? : Total 6 Click Score: 8    End of Session Equipment Utilized During Treatment: Gait belt Activity Tolerance: Patient limited by fatigue Patient left: in chair;with call bell/phone within reach;with nursing/sitter in room Nurse Communication: Mobility status PT Visit Diagnosis: Unsteadiness on feet (R26.81);Muscle weakness (generalized) (M62.81);Pain;Difficulty in walking, not elsewhere classified (R26.2)     Time: 9629-5284 PT Time Calculation (min) (ACUTE ONLY): 29 min  Charges:    $Therapeutic Activity: 23-37 mins PT General Charges $$ ACUTE PT VISIT: 1 Visit                     Felecia Shelling  PTA Acute  Rehabilitation Services Office M-F          [phone]

## 2022-12-29 NOTE — Progress Notes (Addendum)
PHARMACY - TOTAL PARENTERAL NUTRITION CONSULT NOTE   Indication: Short bowel syndrome  Patient Measurements: Height: 5\' 4"  (162.6 cm) Weight: 59.4 kg (130 lb 15.3 oz) IBW/kg (Calculated) : 54.7   Body mass index is 22.48 kg/m. Usual Weight: 46.3 kg from 8/28  Assessment: 47 y.o. female with PMH of recurrent breast cancer not currently on treatment, Crohn's disease s/p subtotal colectomy in 2021, recurrent obstructions, colonic stricture, recent hospitalization 8/10-8/15/2024 for obstruction who presented on 12/12/2022 with abdominal pain, nausea, vomiting diarrhea.    9/4 Admit; found down in ED bathroom with no pulse, CPR > intubated 9/5 s/p ex-lap, found to have internal hernia with small bowel ischemia, only 105 cm of small bowel remaining OR 9/7 for closure, ostomy creation 9/8 pharmacy consulted to manage TPN 9/15 Surgery requesting transition to cyclic TPN  Glucose / Insulin: no Hx DM - steroids PTA for Crohn's disease; weaned off as of 9/13 - CBGs goal < 180. Range: 82-127; no SSI documented as given in past 24hrs - On SSI (sensitive scale) Electrolytes: No new labs 9/20;  Last labs 9/19 -all electrolytes WNL, including CorrCa (9.5) Renal: Baseline SCr <1. AKI s/p arrest, SCr/BUN remain elevated but fairly stable Hepatic: LFT, T.bili WNL. Albumin remains low  - TG (9/11) normalized after stopping propofol I/O:  -mIVF: NaCl @ 50 mL/h - 9/19 7am - 9/20 7am:  UOP: 2575 mL, ileostomy: 1875 mL - getting fiber, iron, loperamide per tube GI Imaging:  - CTA 9/10 shows new PE, but also abdominal ascites - CT Abd: Large volume ascites GI Surgeries / Procedures:  - OR 9/5 ex lap, internal hernia w/ small bowel ischemia, SBR & celiotomy; > 105 cm small bowel remaining; - OR 9/7 for closure, ostomy creation -9/17: Paracentesis  Central access: triple lumen CVC R subclavian 9/4, triple lumen CVC R femoral 9/4;  PAC 05/02/22 TPN start date: 12/17/22  Nutritional Goals: TPN provides 94  g protein and 1680 kcal per day  RD Assessment: Estimated Needs Total Energy Estimated Needs: 1600-1800 Total Protein Estimated Needs: 85-95 grams Total Fluid Estimated Needs: 1.6L/day  Current Nutrition:  Trickle TF TPN   Per surgery note: MBS planned by SLP today, 9/20.   CCS MD requesting repletion of vitamins ADEK.  -Vitamin A level pending prior to supplementation > Dietician to follow -Vitamin K IV weekly, Vitamin D and E per tube  Plan: Continue TPN cycle on 12 hr cycle at 18:00 tonight Anticipate prolonged/indefinite TPN dependency Max glucose infusion rate 6.01 mg/kg/min Electrolytes in TPN: No change Na - 80 mEq/L K - 55 mEq/L Ca - 5 mEq/L Mg - 10 mEq/L Phos - 15 mmol/L Cl:Ac ratio - 1:2 Reduce dextrose to 14% (from 15%) - due to weight decrease from yesterday to maintain max GIR aprox 6mg /kg/min Add standard MVI and trace elements to TPN Chromium on hold d/t critical shortage Continue CBG checks 4x daily while cycling TPN On TPN Off TPN 1-2 hr after completing ramp-up 1 hr after TPN stopped MIVF per primary Monitor TPN labs on Mon/Thurs.  Tacy Learn, PharmD Clinical Pharmacist 12/29/2022 9:28 AM

## 2022-12-29 NOTE — Progress Notes (Addendum)
13 Days Post-Op  Subjective: CC: Some abdominal pain yesterday after working with therapies. Resolved after pain medication. Denies any abdominal pain this morning. Tolerating TF's without nausea or vomiting. Having ostomy output. - down slightly from 2050cc > 1875/24 hours. MBS planned by SLP today.  Febrile to 101.5 over the last 24 hours. Tachycardic still on scheduled metoprolol, 25 mg BID. BP has improved with last pressure 134/82 Hgb 8.2 > 7.6 > 7.2 WBC 27.4 > 26.6 > 21 > 18.2. Seen by ID yesterday and Micafungin added to Zosyn.   Objective: Vital signs in last 24 hours: Temp:  [99.3 F (37.4 C)-101.5 F (38.6 C)] 100.4 F (38 C) (09/20 0700) Pulse Rate:  [119-146] 131 (09/20 0700) Resp:  [20-31] 22 (09/20 0700) BP: (102-178)/(58-92) 134/82 (09/20 0700) SpO2:  [97 %-100 %] 100 % (09/20 0700) Weight:  [59.4 kg] 59.4 kg (09/20 0500) Last BM Date : 12/27/22  Intake/Output from previous day: 09/19 0701 - 09/20 0700 In: 3752.8 [I.V.:3086.4; NG/GT:356.9; IV Piggyback:309.5] Out: 4450 [Urine:2575; Stool:1875] Intake/Output this shift: Total I/O In: -  Out: 175 [Urine:175]  PE: Gen:  Alert, pleasant Card:  Tachycardic Pulm: Rate and effort normal Abd: Soft, mild distension, NT today. No rigidity or guarding on exam. Otherwise NT.  Ostomy pink, budded and viable with thin green liquid in ostomy bag. Midline wound with vac in place with good seal - small amount of SS output in cannister  GU: Foley draining straw colored urine.   Addendum: Seen with WOCN - reviewed picture with attending. Will do white foam at base of wound and black foam over and continue vac.    Lab Results:  Recent Labs    12/28/22 0346 12/29/22 0529  WBC 21.0* 18.2*  HGB 7.6* 7.2*  HCT 23.3* 22.6*  PLT 280 275   BMET Recent Labs    12/27/22 0823 12/28/22 0346  NA 140 137  K 4.2 4.0  CL 106 105  CO2 22 23  GLUCOSE 134* 143*  BUN 53* 48*  CREATININE 1.47* 1.46*  CALCIUM 7.8* 7.5*    PT/INR No results for input(s): "LABPROT", "INR" in the last 72 hours.  CMP     Component Value Date/Time   NA 137 12/28/2022 0346   NA 135 01/21/2020 1056   K 4.0 12/28/2022 0346   CL 105 12/28/2022 0346   CO2 23 12/28/2022 0346   GLUCOSE 143 (H) 12/28/2022 0346   BUN 48 (H) 12/28/2022 0346   BUN 12 01/21/2020 1056   CREATININE 1.46 (H) 12/28/2022 0346   CREATININE 0.91 09/21/2022 1141   CALCIUM 7.5 (L) 12/28/2022 0346   PROT 4.8 (L) 12/28/2022 0346   ALBUMIN <1.5 (L) 12/28/2022 0346   AST 23 12/28/2022 0346   AST 18 09/21/2022 1141   ALT 19 12/28/2022 0346   ALT 12 09/21/2022 1141   ALKPHOS 126 12/28/2022 0346   BILITOT 0.7 12/28/2022 0346   BILITOT 0.4 09/21/2022 1141   GFRNONAA 45 (L) 12/28/2022 0346   GFRNONAA >60 09/21/2022 1141   GFRAA 108 01/21/2020 1056   Lipase     Component Value Date/Time   LIPASE 762 (H) 12/21/2022 0305    Studies/Results: VAS [place] UPPER EXTREMITY VENOUS DUPLEX  Result Date: 12/28/2022 UPPER VENOUS STUDY  Patient Name:  [name]  Date of Exam:   12/28/2022 Medical Rec #: [id number]         Accession #:    [id number] Date of Birth: 1976-01-06  Patient Gender: F Patient Age:   47 years Exam Location:  [facility] Procedure:      VAS [place] UPPER EXTREMITY VENOUS DUPLEX Referring Phys: JEFFREY HATCHER --------------------------------------------------------------------------------  Indications: PE not on anticoagulation Limitations: Line and port placement. Comparison Study: No previous UE exams Performing Technologist: Jody Hill RVT, RDMS  Examination Guidelines: A complete evaluation includes B-mode imaging, spectral Doppler, color Doppler, and power Doppler as needed of all accessible portions of each vessel. Bilateral testing is considered an integral part of a complete examination. Limited examinations for reoccurring indications may be performed as noted.  Right Findings:  +----------+------------+---------+-----------+----------+--------------+ RIGHT     CompressiblePhasicitySpontaneousProperties   Summary     +----------+------------+---------+-----------+----------+--------------+ IJV           Full       Yes       Yes                             +----------+------------+---------+-----------+----------+--------------+ Subclavian                                          Not visualized +----------+------------+---------+-----------+----------+--------------+ Axillary      Full       Yes       Yes                             +----------+------------+---------+-----------+----------+--------------+ Brachial      Full       Yes       Yes                             +----------+------------+---------+-----------+----------+--------------+ Radial        Full                                                 +----------+------------+---------+-----------+----------+--------------+ Ulnar         Full                                                 +----------+------------+---------+-----------+----------+--------------+ Cephalic      Full                                                 +----------+------------+---------+-----------+----------+--------------+ Basilic       Full       Yes       Yes                             +----------+------------+---------+-----------+----------+--------------+ Unable to visualized subclavian vein due to central line placement and overlying dressing. Cephalic vein not well visualized due to size of vessel.  Left Findings: +----------+------------+---------+-----------+----------+--------------------+ LEFT      CompressiblePhasicitySpontaneousProperties      Summary        +----------+------------+---------+-----------+----------+--------------------+ IJV  Full       Yes       Yes                                    +----------+------------+---------+-----------+----------+--------------------+ Subclavian               Yes       Yes                   patent by                                                              color/doppler     +----------+------------+---------+-----------+----------+--------------------+ Axillary      Full       Yes       Yes                                   +----------+------------+---------+-----------+----------+--------------------+ Brachial      Full       Yes       Yes                                   +----------+------------+---------+-----------+----------+--------------------+ Radial        Full                                                       +----------+------------+---------+-----------+----------+--------------------+ Ulnar         Full                                                       +----------+------------+---------+-----------+----------+--------------------+ Cephalic      Full                                                       +----------+------------+---------+-----------+----------+--------------------+ Basilic       Full       Yes       Yes                                   +----------+------------+---------+-----------+----------+--------------------+ Limited visualization of subclavian vein due to port placement. Cephalic vein not well visualized due to size of vessel.  Summary: No evidence of deep vein or superficial vein thrombosis involving the right and left upper extremities. Right: However, unable to visualize the subclavian vein.  *See table(s) above for measurements and observations.    Preliminary    DG CHEST PORT 1 VIEW  Result Date: 12/27/2022 CLINICAL DATA:  Fever EXAM: PORTABLE CHEST 1 VIEW COMPARISON:  12/22/2022 FINDINGS: Enteric tube extends below the diaphragm with distal tip terminating within the third portion of the duodenum. Left chest port and right subclavian central venous  catheter remain appropriately positioned. Normal heart size. Left greater than right bibasilar opacities with bilateral pleural effusions, slightly improved. No pneumothorax. IMPRESSION: Left greater than right bibasilar opacities with bilateral pleural effusions, slightly improved. Electronically Signed   By: Duanne Guess D.O.   On: 12/27/2022 13:43   VAS [place] LOWER EXTREMITY VENOUS (DVT)  Result Date: 12/27/2022  Lower Venous DVT Study Patient Name:  FREDDI HERCZEG  Date of Exam:   12/27/2022 Medical Rec #: [id number]         Accession #:    [id number] Date of Birth: 1975-07-19         Patient Gender: F Patient Age:   52 years Exam Location:  [facility] Procedure:      VAS [place] LOWER EXTREMITY VENOUS (DVT) Referring Phys: Kyrene Longan --------------------------------------------------------------------------------  Indications: Pulmonary embolism.  Risk Factors: Confirmed PE. Limitations: Poor ultrasound/tissue interface, bandages, open wound and patient positioning. Comparison Study: 12/14/2022, 12/20/2022 - Negative for DVT. Performing Technologist: Chanda Busing RVT  Examination Guidelines: A complete evaluation includes B-mode imaging, spectral Doppler, color Doppler, and power Doppler as needed of all accessible portions of each vessel. Bilateral testing is considered an integral part of a complete examination. Limited examinations for reoccurring indications may be performed as noted. The reflux portion of the exam is performed with the patient in reverse Trendelenburg.  +---------+---------------+---------+-----------+----------+--------------+ RIGHT    CompressibilityPhasicitySpontaneityPropertiesThrombus Aging +---------+---------------+---------+-----------+----------+--------------+ CFV      Full           Yes      Yes                                 +---------+---------------+---------+-----------+----------+--------------+ SFJ      Full                                                         +---------+---------------+---------+-----------+----------+--------------+ FV Prox  Full                                                        +---------+---------------+---------+-----------+----------+--------------+ FV Mid                  Yes      Yes                                 +---------+---------------+---------+-----------+----------+--------------+ FV DistalFull                                                        +---------+---------------+---------+-----------+----------+--------------+ PFV      Full                                                        +---------+---------------+---------+-----------+----------+--------------+  POP      Full           Yes      Yes                                 +---------+---------------+---------+-----------+----------+--------------+ PTV      Full                                                        +---------+---------------+---------+-----------+----------+--------------+ PERO     Full                                                        +---------+---------------+---------+-----------+----------+--------------+   +---------+---------------+---------+-----------+----------+--------------+ LEFT     CompressibilityPhasicitySpontaneityPropertiesThrombus Aging +---------+---------------+---------+-----------+----------+--------------+ CFV      Full           Yes      Yes                                 +---------+---------------+---------+-----------+----------+--------------+ SFJ      Full                                                        +---------+---------------+---------+-----------+----------+--------------+ FV Prox  Full                                                        +---------+---------------+---------+-----------+----------+--------------+ FV Mid   Full           Yes      Yes                                  +---------+---------------+---------+-----------+----------+--------------+ FV DistalFull           Yes      Yes                                 +---------+---------------+---------+-----------+----------+--------------+ PFV      Full                                                        +---------+---------------+---------+-----------+----------+--------------+ POP      Full           Yes      Yes                                 +---------+---------------+---------+-----------+----------+--------------+  PTV      Full                                                        +---------+---------------+---------+-----------+----------+--------------+ PERO     Full                                                        +---------+---------------+---------+-----------+----------+--------------+     Summary: RIGHT: - There is no evidence of deep vein thrombosis in the lower extremity. However, portions of this examination were limited- see technologist comments above.  - No cystic structure found in the popliteal fossa.  LEFT: - There is no evidence of deep vein thrombosis in the lower extremity. However, portions of this examination were limited- see technologist comments above.  - No cystic structure found in the popliteal fossa.  *See table(s) above for measurements and observations. Electronically signed by Coral Else MD on 12/27/2022 at 12:34:10 PM.    Final     Anti-infectives: Anti-infectives (From admission, onward)    Start     Dose/Rate Route Frequency Ordered Stop   12/28/22 1430  micafungin (MYCAMINE) 100 mg in sodium chloride 0.9 % 100 mL IVPB        100 mg 105 mL/hr over 1 Hours Intravenous Every 24 hours 12/28/22 1335     12/23/22 2000  vancomycin (VANCOREADY) IVPB 1250 mg/250 mL  Status:  Discontinued        1,250 mg 166.7 mL/hr over 90 Minutes Intravenous Every 36 hours 12/22/22 0612 12/22/22 1103   12/22/22 0645  vancomycin (VANCOREADY) IVPB 1250 mg/250 mL         1,250 mg 166.7 mL/hr over 90 Minutes Intravenous  Once 12/22/22 0551 12/22/22 0805   12/22/22 0600  piperacillin-tazobactam (ZOSYN) IVPB 3.375 g        3.375 g 12.5 mL/hr over 240 Minutes Intravenous Every 8 hours 12/22/22 0551     12/13/22 1000  piperacillin-tazobactam (ZOSYN) IVPB 3.375 g  Status:  Discontinued        3.375 g 12.5 mL/hr over 240 Minutes Intravenous Every 8 hours 12/13/22 4782 12/20/22 1333        Assessment/Plan Hx Crohn's disease s/p subtotal colectomy  Internal hernia with bowel ischemia - POD 15 exploratory laparotomy with small bowel resection 9/5 Dr. Hillery Hunter - POD 13 s/p exploratory laparotomy, ileostomy, abdominal closure 9/7 Dr. 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 Highly likely to have high ostomy output  - Cont TPN. Will likely need TPN at d/c. Pre-alb 11 and Albumin < 1.5 on 9/19 - Imodium 2mg  BID, Iron 325mg  BID, Fibercon 625mg  BID started 9/11 to slow ileostomy output down.  Increased loperamide from 2 mg to 4 twice daily 9/14.  Increase loperamide to 4mg  QID 9/19. Output downtrending. Continue to trend. Could consider adding Lomotil. May need intermittent fluid replacement for high ileostomy output.  - On trickle TF's. Would continue for now. Consider slow increase over the weekend.  - Diet per SLP. Currenlty on Nectar-thick liquid;Dysphagia 1 (puree) via tsp only - MBS today - WOCN following for new ostomy. Plan ostomy  clinic referral at d/c - Midline wound with hx of bleeding leading. Resolved. Cont Vac. Plan vac change today.  - CT A/P 9/16 done. No indication for return to the OR. Large amount of ascites. S/p IR paracentesis yesterday with 500cc of clear yellow fluid. Cx pending. No growth to date.  - ABx and anti-fungals per ID - PT/OT - Palliative following peripherally currently.  - Updated patients daughter over speaker phone while I was in the room.   ID - Zosyn  9/4>>9/11. 9/13 >> Micafungin 9/19 >> Patient still febrile with elevated leukocytosis. CT A/P without clear source of fever and paracentesis yesterday with clear yellow fluid and cx w/ no growth to date. LE [place] 9/18 neg. UE [place] 9/19 neg. UA neg. CXR w/ L > R opacities with b/l pleural effusions. Defer to [facility] if pleural effusions need further addressing. Resp Cx ordered. Bcx including specific fungal blood cx NGTD.   FEN - D1 per SLP as above. On trickle TF's. TPN VTE - SCDs, HEP GTT held for bleeding/ anemia.  Foley - Failed TOV. Foley in place with good UOP.   --per [facility]- PE - Noted on CTA 9/10. LE [place] negative for DVT. Heparin gtt currently on hold for ABL anemia.  If hgb stable in AM hopefully can restart Heparin with no bolus. Agree with CCM that we may need to consider IVC temporary filter at some point if unable to restart heparin gtt (hx breast ca, recent surgery, immobilized). Shock - off pressors VDRF - extubated  Recent cardiac arrest 9/3 AKI - Cr stable on most recent labs Anemia - Transfuse to keep hemoglobin above 7. S/p 1U PRBC 9/12, 2U 9/14, 1U 9/16, 1U 9/17. Hgb 8.2 > 7.6 > 7.2. Could consider 1U PRBC today. Consider IV iron supplementation - she has short gut so worried she will not absorb iron well orally. Last PT-INR 9/15 was 1.3.  Hx metastatic breast cancer   LOS: 16 days    Jacinto Halim , [facility] 12/29/2022, 7:59 AM Please see Amion for pager number during day hours 7:00am-4:30pm

## 2022-12-29 NOTE — Progress Notes (Signed)
Initial Nutrition Assessment  DOCUMENTATION CODES:   Non-severe (moderate) malnutrition in context of chronic illness  INTERVENTION:  - Plan to continue cyclic TPN x12 hours overnight. Meeting 100% of estimated needs.             - TPN management per Pharmacy   - Continue trickle TF per Surgery.  - Will change to [facility] due to reported lactose intolerance and diarrhea.  [employer] 1.4 at 36mL/hr  - MBS complete, patient advanced to a DYS 1 diet with nectar thickened liquids.  - Will add [employer] 1.4 PO BID, each supplement provides 455 kcal and 20 grams protein.    - Per Surgery, plan to start supplementation of fat soluble vitamins D, E, and K. Checking levels to assess need for maintenance doses or repletion doses of supplementation.  - Vitamin D: LOW, alerted Surgery that supplementation needs to increase to 50,000 units Q7 days x2 months.             - Vitamin E: pending, continue 100 units vitamin E daily per tube.             - Vitamin K: pending, IV Vitamin K supplementation per Surgery/Pharmacy.              - Vitamin A: pending; checking vitamin A prior to supplementation.    - Continue MVI in TPN.   - Add 500mg  vitamin C BID and 220mg  zinc once medically appropriate to promote wound healing.    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    NUTRITION DIAGNOSIS:   Moderate Malnutrition related to chronic illness (Crohn's disease; metastatic breast cancer) as evidenced by moderate fat depletion, moderate muscle depletion, percent weight loss (24% in 4.5 months). *ongoing  GOAL:   Patient will meet greater than or equal to 90% of their needs *met with TPN  MONITOR:   Labs, Weight trends, TF tolerance, I & O's, Diet advancement (TPN)  REASON FOR ASSESSMENT:   Consult Enteral/tube feeding initiation and management  ASSESSMENT:   47 y.o. female with PMH of recurrent breast cancer  not currently on treatment, Crohn's disease s/p subtotal colectomy in 2021, recurrent obstructions, colonic stricture, recent hospitalization 8/10-8/15/2024 for obstruction who presented on 12/12/2022 with abdominal pain, nausea, vomiting diarrhea.  9/4 Admit; found down in ED bathroom with no pulse, intubated 9/5 s/p ex-lap, found to have internal hernia with small bowel ischemia, only ~100cm of small bowel remaining 9/7 ex-lap, I&D, end ileostomy 9/8 TPN started at 26mL/hr 9/9 TPN increased to 38mL/hr 9/10 extubated, NGT removed but then replaced. 9/12: TF stopped 9/14: small bore NGT placed for medications, MBS performed 9/18: trickle TF of [employer] 1.2 started, starting fat soluble vitamin supplementation 9/19: Transitioned to 12 hour cyclic feeds 9/20: MBS -> diet advanced to DYS 1 with nectar thick liquids  Patient working with PT at time of visit.  Per RN, patient tolerating tube feeds but reported having issues with lactose and not tolerating standard formula well.   Diet advanced to DYS 1 with nectar thick liquids after MBS today. Per RN, plan to take intake very slowly and monitor closely.   Per Surgery's note today, plan to continue trickle tube feeds. However, patient now on an oral diet. Plan to run tube feeds overnight and allow patient to eat during the day.  Will change tube feeds to [employer] due  to reported intolerance issues and type 7 bowel movements.   Will also add oral Molli Posey for patient to continue as she has reported liking supplement in the past.  Will monitor oral intake. Now that patient can eat orally, could consider removing SBFT once appropriate to allow for focusing on oral nutrition.   As patient has only 100cm bowel left, she will need to be TPN dependent long term. 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    Admit weight: 125# Current weight: 130# I&O's: + 3.8L  Medications reviewed and  include: 400 units vitamin D, 300mg  ferrous sulfate, 100 units vitamin E, 2mg  IV vitamin K, [employer]ris[employer] fiber BID, Imodium  Labs reviewed:  No BMP today HA1C 5.7 Blood Glucose 82-127 x24 hours Vitamin D (LOW): 24.57 Vitamin A: pending Vitamin E: pending Vitamin K: pending   Diet Order:   Diet Order             DIET - DYS 1 Fluid consistency: Nectar Thick  Diet effective now                   EDUCATION NEEDS:  Education needs have been addressed  Skin:  Skin Assessment: Reviewed RN Assessment Skin Integrity Issues:: Incisions Incisions: Abdomen  Last BM:  9/19 - type 7 - ileostomy  Height:  Ht Readings from Last 1 Encounters:  12/13/22 5\' 4"  (1.626 m)   Weight:  Wt Readings from Last 1 Encounters:  12/29/22 59.4 kg    BMI:  Body mass index is 22.48 kg/m.  Estimated Nutritional Needs:  Kcal:  1600-1800 Protein:  85-95 grams Fluid:  1.6L/day    Shelle Iron RD, LDN For contact information, refer to [facility].

## 2022-12-29 NOTE — Consult Note (Addendum)
WOC Nurse wound follow up; this visit made with Kirstie Mirza, CCS PA  Wound type: full thickness surgical  Measurement: see 12/27/2022 note  Wound bed: red moist, area of light tan tissue more distal aspect of wound (assessed by surgical PA); suture noted mid wound as previous; blackened areas at 1 o'clock and 6 o'clock from previous cauterization  Drainage (amount, consistency, odor) moderate serosanguinous  Periwound: intact, ileostomy to left (unable to perform NPWT dressing change without changing pouch at this time)  Dressing procedure/placement/frequency: Removed old NPWT dressing after soaking well with NS as has had difficulty with wound bleeding previously  Cleansed wound with normal saline Filled distal portion of wound with  1 piece white foam then 1 piece black foam  Sealed NPWT dressing at HG Patient received IV pain medication per bedside nurse prior to dressing change Patient tolerated procedure well  WOC nurse will continue to provide NPWT dressing changes due to the complexity of the dressing change. Next change Monday 01/01/2023 with CCS PA.  1 large black foam and 1 white foam kit in room.    WOC Nurse ostomy follow up; patient uses hand held mirror to watch ileostomy pouch change today  Stoma type/location: LLQ ileostomy  Stomal assessment/size: 1 1/4" round, well budded, pink and moist  Peristomal assessment: intact, midline incision as above  Treatment options for stomal/peristomal skin: 2" barrier ring  Output green liquid  Ostomy pouching: 2 piece 2 1/4" skin barrier [place] [phone]) and 2 1/4" high output pouch [place] [phone])  Education provided: Discussed with patient she will likely not need a high output pouch at home once output thickens up and slows down but will need to empty pouch when 1/3 to 1/2 full. Discussed once midline incision no longer requiring NPWT will change ostomy pouch 2 times a week and as needed for leaking.  Demonstrated removing old pouch with  push pull technique and cleaning around stoma with water moistened washcloth.  Sized stoma at 1 1/4" today and we discussed that stoma is still edematous and may decrease in size as edema subsides.  I stretched a 2" barrier ring to fit snugly around stoma.  Demonstrated cutting new skin barrier at 1 1/4" and placing on top of barrier ring.  Attached high output pouch and hooked to bedside drainage bag.    Daughter has performed pouch change once at previous teaching session.   Enrolled patient in [employer] Discharge program: Yes  Ostomy supplies in room.    WOC team will continue to follow patient for ostomy education and support as long as remains inpatient.   Thank you,    Priscella Mann MSN, RN-BC, [employer] [phone]

## 2022-12-29 NOTE — Progress Notes (Signed)
Speech Language Pathology Treatment: Dysphagia  Patient Details Name: [name] MRN: [id number] DOB: 03/03/1976 Today's Date: 12/29/2022 Time: 1315-1340 SLP Time Calculation (min) (ACUTE ONLY): 25 min  Assessment / Plan / Recommendation Clinical Impression  Patient seen following MBS for assessment of po tolerance and reinforcement of effective compensation strategies. Meal tray had arrived and SlP assisted set up for po. She consumed approx 1/5 of her meal including few bites of Magic cup, 1/4 cup of pureed berries, few bites of macaroni and cheese, and 2 ounces of nectar thickened tea via straw and later Provale cup. Pt reported being happy to get to eat again but also admitted to being full. Subtle throat clearing noted after puree bolus - nectar thick liquids via straw noted with freChronic disc and endplate degeneration at C5-C6 and C6-C7 with suspected mild spinal stenosis and moderate to severe foraminal stenosis. 3. Postoperative changes to the right neck, perhaps prior endarterectomy. Small retained metallic foreign body adjacent to the left mandible.quent throat clearing and cough x1. Suspect she aspirated with reflexive cough post-swallow of nectar via straw noted post-swallow - Therefore SLP provided her with Provale cup for bolus flow control. Using teach back, she benefited from mod cues to cough strongly and clear and cease po intake. Reviewed need to monitor pt's vitals closely due to her silent aspiration and weak nonprotective cough. Patient can consume liquids independently from her Provale cup but continue to recommend full supervision for meals. All in agreement to plan - Will continue to follow for dysphagia management and strengthening of laryngeal closure/elevation.     HPI HPI: Per CCM note "47 year old female with past medical history of recurrent breast cancer not currently on treatment, Crohn's disease s/p subtotal colectomy in 2021 recurrent obstructions, colonic  stricture, IDA, recent hospitalization 8/10-8/15/2024 for obstruction seen by GI and general surgery. Underwent EGD and flexible sigmoidoscopy and discharged on steroids who presented to the emergency department on 12/12/2022 with abdominal pain, nausea, vomiting diarrhea. In the ED tachycardic, hypertensive. ED provider notes abdominal distention. She was given 2L IVF, morphine, IV PPI. Labs with WBC 10.8, Hgb 10.7, K 3.2. CT AP was ordered and showed markedly dilated distal small bowel consistent with chronic obstruction. No changes since prior study. Patient was admitted to [facility]. They consulted GI for input. Unfortunately, around 0820 9/4 patient was found on the floor in the bathroom in ED pulseless. They estimate downtime around 5 minutes. ACLS was initiated. Concern for possible torsades de pointes and given IV mag, defibrillated. Coded for around 5 minutes. She was intubated. She had copious vomitus in the ETT. ROSC achieved and ICU was consulted. Notably, she had I-stat peri-code which showed hemoglobin of 5.5 down from 10 overnight."  Pt was extubated 12/18/2021.  Pt has been maintained on TPN and is now getting trickle feeds for nutrition. SLP following for dysphagia management.      SLP Plan  Continue with current plan of care      Recommendations for follow up therapy are one component of a multi-disciplinary discharge planning process, led by the attending physician.  Recommendations may be updated based on patient status, additional functional criteria and insurance authorization.    Recommendations  Diet recommendations: Nectar-thick liquid;Dysphagia 1 (puree) Liquids provided via: Cup;Teaspoon (Provale cup) Medication Administration: Whole meds with puree (or via tube) Supervision: Patient able to self feed Compensations: Slow rate;Small sips/bites;Other (Comment) (HOB reclined 45 degrees) Postural Changes and/or Swallow Maneuvers:  (Chair or bed RECLINED)  Oral  care BID   Frequent or constant Supervision/Assistance Dysphagia, pharyngeal phase (R13.13)     Continue with current plan of care   Rolena Infante, MS [facility] SLP Acute Rehab Services Office [phone]   Chales Abrahams  12/29/2022, 2:42 PM

## 2022-12-29 NOTE — Plan of Care (Signed)
  Problem: Coping: Goal: Ability to adjust to condition or change in health will improve Outcome: Progressing   Problem: Metabolic: Goal: Ability to maintain appropriate glucose levels will improve Outcome: Progressing   Problem: Tissue Perfusion: Goal: Adequacy of tissue perfusion will improve Outcome: Progressing   Problem: Clinical Measurements: Goal: Ability to maintain clinical measurements within normal limits will improve Outcome: Progressing Goal: Will remain free from infection Outcome: Progressing

## 2022-12-29 NOTE — Evaluation (Addendum)
Modified Barium Swallow Study  Patient Details  Name: [name] MRN: [id number] Date of Birth: 08-Jul-1975  Today's Date: 12/29/2022  Modified Barium Swallow completed.  Full report located under Chart Review in the Imaging Section.  History of Present Illness Per CCM note "47 year old female with past medical history of recurrent breast cancer not currently on treatment, Crohn's disease s/p subtotal colectomy in 2021 recurrent obstructions, colonic stricture, IDA, recent hospitalization 8/10-8/15/2024 for obstruction seen by GI and general surgery. Underwent EGD and flexible sigmoidoscopy and discharged on steroids who presented to the emergency department on 12/12/2022 with abdominal pain, nausea, vomiting diarrhea. In the ED tachycardic, hypertensive. ED provider notes abdominal distention. She was given 2L IVF, morphine, IV PPI. Labs with WBC 10.8, Hgb 10.7, K 3.2. CT AP was ordered and showed markedly dilated distal small bowel consistent with chronic obstruction. No changes since prior study. Patient was admitted to [facility]. They consulted GI for input. Unfortunately, around 0820 9/4 patient was found on the floor in the bathroom in ED pulseless. They estimate downtime around 5 minutes. ACLS was initiated. Concern for possible torsades de pointes and given IV mag, defibrillated. Coded for around 5 minutes. She was intubated. She had copious vomitus in the ETT. ROSC achieved and ICU was consulted. Notably, she had I-stat peri-code which showed hemoglobin of 5.5 down from 10 overnight."  Pt was extubated 12/18/2021.  Pt has been maintained on TPN and is now getting trickle feeds for nutrition. SLP following for dysphagia management.   Clinical Impression Today patient presents with improvement in swallow function compared to prior approx six days ago.  Mild pharyngeal dysphagia remains with inconsistent penetration of nectar/thin and mild aspiration of thin mixed with secretions without sensation.  However shallow aspiration and penetration was largely cleared with cued throat clear and re=swallow.  Deeper aspirates were not cleared with cued cough as cough remains weak and ineffective.  Pt's dysphagia continues to be characterized by impaired timing and adequacy of laryngeal closure/elevation.  Posterior posture at 45* helpful to retain boluses in posterior pharynx and subsequently decreasing airway risk. Did not remove feeding tube as did not appear to negatively impact swallow function significantly.  Recommend intiiate po diet of dys3/nectar thick liquids via tsp with strict precautions.  When pt is clearing her throat with po intake - she is likely penetrating at least to vocal fold level based on this test finding. Pt will require strict monitoring of po tolerance given silent nature of dysphagia and weak cough.  SLP will continue to follow for swallow exercises to mitigate dysphagia. Factors that may increase risk of adverse event in presence of aspiration Rubye Oaks & [name] 2021): Poor general health and/or compromised immunity;Respiratory or GI disease;Limited mobility;Weak cough;Frequent aspiration of large volumes  Swallow Evaluation Recommendations Recommendations: PO diet PO Diet Recommendation: Dysphagia 1 (Pureed) Mildly thick liquids (Level 2, nectar thick) Liquid Administration via: Spoon Medication Administration: Via alternative means (or with PUDDING) Supervision: Full assist for feeding Swallowing strategies  : Minimize environmental distractions;Slow rate;Small bites/sips/ Clear throat strongly after few sips and re-swallow; Stop intake if pt coughing with po  Postural changes: Partially reclined for meals (45*) Oral care recommendations: Oral care QID (4x/day) Recommended consults: Consider ENT consultation Caregiver Recommendations: Have oral suction available    Rolena Infante, MS [facility] SLP Acute Rehab Services Office [phone]   Chales Abrahams 12/29/2022,9:09  AM

## 2022-12-29 NOTE — Progress Notes (Signed)
Inpatient Rehab Admissions Coordinator:   I will place a rehab consult order per our protocol.    Estill Dooms, PT, DPT Admissions Coordinator [phone] 12/29/22  1:58 PM

## 2022-12-29 NOTE — TOC Progression Note (Signed)
Transition of Care [facility]) - Progression Note    Patient Details  Name: [name] MRN: [id number] Date of Birth: May 19, 1975  [facility] CM/SW Contact  Lavenia Atlas, RN Phone Number: 12/29/2022, 5:20 PM  Clinical Narrative:   Per chart review palliative is following patient. PT has recommended AIR, Caitlin with with CIR is following patient.  TOC will continue to follow for needs.    Expected Discharge Plan: Home/Self Care (unsure at this time) Barriers to Discharge: Continued Medical Work up  Expected Discharge Plan and Services In-house Referral: Hospice / Palliative Care Discharge Planning Services: CM Consult Post Acute Care Choice: NA Living arrangements for the past 2 months: Single Family Home                 DME Arranged: N/A DME Agency: NA       HH Arranged: NA HH Agency: NA         Social Determinants of Health (SDOH) Interventions SDOH Screenings   Food Insecurity: No Food Insecurity (11/21/2022)  Housing: Patient Unable To Answer (11/21/2022)  Transportation Needs: No Transportation Needs (11/21/2022)  Utilities: Not At Risk (11/21/2022)  Tobacco Use: Medium Risk (12/14/2022)    Readmission Risk Interventions    12/20/2022    3:50 PM 12/15/2022    2:43 PM  Readmission Risk Prevention Plan  Transportation Screening Complete Complete  PCP or Specialist Appt within 3-5 Days  Complete  HRI or Home Care Consult  Complete  Social Work Consult for Recovery Care Planning/Counseling  Complete  Palliative Care Screening  Complete  Medication Review Oceanographer) Complete Complete  PCP or Specialist appointment within 3-5 days of discharge Complete   HRI or Home Care Consult Complete   SW Recovery Care/Counseling Consult Complete   Palliative Care Screening Complete   Skilled Nursing Facility Not Applicable

## 2022-12-29 NOTE — Plan of Care (Signed)
PT had repeat swallow eval today, which has allowed [place] to begin giving [place] food and drink by mouth (see in depth report from speech concerning this). PT stood for two minutes with physical therapy and went outside via recliner today. She tolerated using maxi sky lift to get back into bed.

## 2022-12-29 NOTE — Progress Notes (Signed)
PROGRESS NOTE  [name]  DOB: 1975/12/05  PCP: Ollen Bowl, MD ZOX:[id number]  DOA: 12/12/2022  LOS: 16 days  Hospital Day: 18  Brief narrative: [name] is a 47 y.o. female with PMH significant for Crohn's disease with previous bowel obstruction s/p subtotal colectomy 2021, colonic stricture, IDA, breast cancer s/p mastectomy 2022 currently not on treatment. Recently hospitalized 8/10-8/15/2024 for bowel obstruction seen by GI and general surgery. Underwent EGD and flexible sigmoidoscopy and discharged on steroids.  9/3, patient presented to the ED with abdominal distention, pain, nausea, vomiting CT abd/pelvis showed markedly dilated distal small bowel consistent with chronic obstruction. No changes since prior study.  Patient was admitted to [facility] with a plan of GI consultation.  9/4, in the morning, within few hours of admission, patient was found pulseless on the floor in the bathroom in ED, estimated downtime about 5 minutes. ACLS was initiated. Concern for possible torsades de pointes and given IV mag, defibrillated.  ROSC achieved in about 5 minutes. She was intubated. She had copious vomitus in the ETT.  Notably, she had I-stat peri-code which showed hemoglobin of 5.5 down from 10 overnight.  Admitted to ICU In the ICU, patient had right femoral CVC placed.  Given 3 minutes of PRBC, 3 units of FFP.  Patient was started on vasopressors  9/5, underwent exploratory laparotomy.  Noted to have internal hernia with associated small bowel ischemia.  Underwent bowel resection. 9/7, back to OR for ostomy placement 9/8, weaned off Levophed 9/10, extubated 9/10, found to have PE in CT angio chest.  Started on heparin drip which was subsequently stopped the next day because of drop in hemoglobin 9/12, mental status improved, became more interactive.  However failed swallow eval. 9/16, transferred out to [facility] 9/16, CT abdomen showed peritoneal fluid collection 9/17,  underwent paracentesis, 500 cc of clear yellow fluid drained  Subjective: Patient was seen and examined this afternoon.  Sitting up in recliner. Remains intermittently febrile, remains tachycardic. Tube feeding held.  Dysphagia 1 diet started Phonation gradually improving.  Assessment and plan: Internal hernia with bowel ischemia  Short gut syndrome Dysphagia s/p ex lap with small bowel resection -9/5 Dr. Hillery Hunter s/p ex lap, ileostomy, abdominal closure 9/7 Dr. Hillery Hunter H/o Crohn's disease s/p prior subtotal colectom Currently only has 100 cm of small bowel left.  High ostomy output.   TPN dependent To slow down ostomy output, patient is currently on Imodium, iron, FiberCon, Imodium General Surgery following.   Speech therapy reevaluation obtained.  Swallowing improving.  Tube feeding stopped.  Currently on dysphagia 1 diet.  Persistent fever Patient continues to have persistent fever. Extensive workup done.  Blood cultures sent yesterday.  Fungal culture sent with as well. 9/19, ID consulted.  Currently on ciprofloxacin, Flagyl and micafungin. Continues to have fever Recent Labs  Lab 12/25/22 1212 12/26/22 0500 12/27/22 0359 12/28/22 0346 12/29/22 0529  WBC 25.7* 27.4* 23.6* 21.0* 18.2*   AKI Likely due to ATN related to septic shock. Improving currently. Recent Labs    12/19/22 0502 12/20/22 0347 12/21/22 1027 12/22/22 0445 12/23/22 0803 12/24/22 0408 12/25/22 0334 12/26/22 0500 12/27/22 0823 12/28/22 0346  BUN 39* 45* 51* 53* 57* 52* 51* 57* 53* 48*  CREATININE 1.71* 1.67* 1.40* 1.49* 1.61* 1.48* 1.41* 1.76* 1.47* 1.46*   Acute on chronic anemia Hemoglobin trend as below.  Continue to show gradual drop.  Hemoglobin 7.2 today. Repeat tomorrow.  Probably needs blood transfusion. Received a total of 8 units of  PRBC so far. Recent Labs    11/20/22 0252 11/21/22 0308 12/25/22 1212 12/26/22 0500 12/27/22 0359 12/28/22 0346 12/29/22 0529  HGB 8.9*   < >  7.1* 6.9* 8.2* 7.6* 7.2*  MCV 79.2*   < > 90.5 90.0 89.4 91.4 93.4  VITAMINB12 1,244*  --   --   --   --   --   --   FOLATE 16.2  --   --   --   --   --   --   FERRITIN 160  --   --   --   --   --   --   TIBC 118*  --   --   --   --   --   --   IRON 22*  --   --   --   --   --   --   RETICCTPCT 0.9  --   --   --   --   --   --    < > = values in this interval not displayed.   Pulmonary embolism Left lower lobe segmental Holding heparin given persistent intermittent bleeding from surgical site, worsening anemia Continue SCD boots   Hyperglycemia A1c 5.7 on 9//24 Continue SSI/Accu-Cheks Recent Labs  Lab 12/28/22 1546 12/28/22 2008 12/29/22 0037 12/29/22 0852 12/29/22 1132  GLUCAP 82 110* 127* 93 100*    Recurrent breast cancer: Follows up with oncology Dr. Pamelia Hoit   Mobility: Encourage ambulation once hemodynamically stabilizes  Goals of care   Code Status: Full Code     DVT prophylaxis:  Place and maintain sequential compression device Start: 12/15/22 1031 SCDs Start: 12/13/22 0413   Antimicrobials: IV ciprofloxacin, IV Flagyl, micafungin Fluid: TPN Consultants: General surgery, ID Family Communication: Daughter not at bedside today  Status: Inpatient Level of care:  Stepdown   Patient is from: Home Needs to continue in-hospital care: Remains hypotensive, tachycardic, has significant output from ostomy Anticipated d/c to: Pending clinical course   Diet:  Diet Order             DIET - DYS 1 Fluid consistency: Nectar Thick  Diet effective now                   Scheduled Meds:  sodium chloride   Intravenous Once   acetaminophen  1,000 mg Per Tube TID   [START ON 12/30/2022] ascorbic acid  500 mg Per Tube BID   famotidine  20 mg Per Tube Daily   feeding supplement (KATE FARMS STANDARD 1.4)  180 mL Per Tube Daily   feeding supplement (KATE FARMS STANDARD 1.4)  325 mL Oral BID BM   ferrous sulfate  300 mg Per Tube BID   fiber  1 packet Per Tube BID    gabapentin  200 mg Per Tube Q8H   insulin aspart  0-9 Units Subcutaneous 4 times per day   loperamide HCl  4 mg Per Tube QID   metoprolol tartrate  25 mg Per Tube BID   sodium chloride flush  10-40 mL Intracatheter Q12H   vitamin E  100 Units Per Tube Daily   [START ON 12/30/2022] zinc sulfate  220 mg Per Tube Daily    PRN meds: albuterol, artificial tears, fentaNYL (SUBLIMAZE) injection, food thickener, labetalol, lip balm, LORazepam, [DISCONTINUED] ondansetron **OR** ondansetron (ZOFRAN) IV, mouth rinse, oxyCODONE, silver nitrate applicators, sodium chloride flush   Infusions:   ciprofloxacin 400 mg (12/29/22 1405)   metronidazole 500 mg (12/29/22 1352)  micafungin (MYCAMINE) 100 mg in sodium chloride 0.9 % 100 mL IVPB Stopped (12/28/22 1748)   phytonadione (VITAMIN K) 2 mg in dextrose 5 % 50 mL IVPB Stopped (12/28/22 1642)   TPN CYCLIC-ADULT (ION)      Antimicrobials: Anti-infectives (From admission, onward)    Start     Dose/Rate Route Frequency Ordered Stop   12/29/22 1215  ciprofloxacin (CIPRO) IVPB 400 mg        400 mg 200 mL/hr over 60 Minutes Intravenous 2 times daily 12/29/22 1123     12/29/22 1215  metroNIDAZOLE (FLAGYL) IVPB 500 mg        500 mg 100 mL/hr over 60 Minutes Intravenous 2 times daily 12/29/22 1123     12/28/22 1430  micafungin (MYCAMINE) 100 mg in sodium chloride 0.9 % 100 mL IVPB        100 mg 105 mL/hr over 1 Hours Intravenous Every 24 hours 12/28/22 1335     12/23/22 2000  vancomycin (VANCOREADY) IVPB 1250 mg/250 mL  Status:  Discontinued        1,250 mg 166.7 mL/hr over 90 Minutes Intravenous Every 36 hours 12/22/22 0612 12/22/22 1103   12/22/22 0645  vancomycin (VANCOREADY) IVPB 1250 mg/250 mL        1,250 mg 166.7 mL/hr over 90 Minutes Intravenous  Once 12/22/22 0551 12/22/22 0805   12/22/22 0600  piperacillin-tazobactam (ZOSYN) IVPB 3.375 g  Status:  Discontinued        3.375 g 12.5 mL/hr over 240 Minutes Intravenous Every 8 hours 12/22/22  0551 12/29/22 1123   12/13/22 1000  piperacillin-tazobactam (ZOSYN) IVPB 3.375 g  Status:  Discontinued        3.375 g 12.5 mL/hr over 240 Minutes Intravenous Every 8 hours 12/13/22 0833 12/20/22 1333       Objective: Vitals:   12/29/22 1200 12/29/22 1300  BP:    Pulse: (!) 124 (!) 116  Resp: 20 (!) 24  Temp:    SpO2: 99% 100%    Intake/Output Summary (Last 24 hours) at 12/29/2022 1449 Last data filed at 12/29/2022 1200 Gross per 24 hour  Intake 3349.91 ml  Output 4725 ml  Net -1375.09 ml   Filed Weights   12/27/22 0408 12/28/22 0500 12/29/22 0500  Weight: 61.8 kg 63.4 kg 59.4 kg   Weight change: -4 kg Body mass index is 22.48 kg/m.   Physical Exam: General exam: Pleasant, [name].  Looks sick.. Skin: No rashes, lesions or ulcers. HEENT: Atraumatic, normocephalic, no obvious bleeding.  Dobbhoff tube remains clamped Lungs: Diminished air entry bilaterally.  Clear to auscultation bilaterally CVS: Has persistent tachycardia, regular rhythm, no murmur GI/Abd soft, positive tenderness present, incision site care per general surgery, ostomy bag attached to drain CNS: Alert, awake, oriented x 3 Psychiatry: Sad affect Extremities: Trace bilateral pedal edema, no calf tenderness  Data Review: I have personally reviewed the laboratory data and studies available.  F/u labs ordered Unresulted Labs (From admission, onward)     Start     Ordered   12/30/22 0500  Basic metabolic panel  Daily,   R     Question:  Specimen collection method  Answer:  Unit=Unit collect   12/29/22 0854   12/30/22 0500  Comprehensive metabolic panel  Tomorrow morning,   R       Question:  Specimen collection method  Answer:  Unit=Unit collect   12/29/22 0938   12/30/22 0500  Magnesium  Tomorrow morning,   R  Question:  Specimen collection method  Answer:  Unit=Unit collect   12/29/22 0938   12/30/22 0500  Phosphorus  Tomorrow morning,   R       Question:  Specimen  collection method  Answer:  Unit=Unit collect   12/29/22 0938   12/28/22 0500  Vitamin A  Tomorrow morning,   R       Question:  Specimen collection method  Answer:  Unit=Unit collect   12/27/22 1103   12/28/22 0500  Vitamin E  Tomorrow morning,   R       Question:  Specimen collection method  Answer:  Unit=Unit collect   12/27/22 1103   12/28/22 0500  Vitamin K1, Serum  Tomorrow morning,   R       Question:  Specimen collection method  Answer:  Unit=Unit collect   12/27/22 1103   12/27/22 0844  Expectorated Sputum Assessment w Gram Stain, Rflx to Resp Cult  Once,   R        12/27/22 0902   12/25/22 0600  Comprehensive metabolic panel  Every Mon,Thu,   R     Question:  Specimen collection method  Answer:  Unit=Unit collect   12/21/22 0846   12/20/22 0500  CBC  Daily,   R     Comments: While on heparin drip   Question:  Specimen collection method  Answer:  Unit=Unit collect   12/19/22 1720   12/18/22 0500  Magnesium  (Standard TPN Labs (all labs to be drawn at 0500))  Every Mon,Thu (0500),   R     Question:  Specimen collection method  Answer:  Unit=Unit collect   12/17/22 1152   12/18/22 0500  Phosphorus  (Standard TPN Labs (all labs to be drawn at 0500))  Every Mon,Thu (0500),   R     Question:  Specimen collection method  Answer:  Unit=Unit collect   12/17/22 1152   12/14/22 0000  Prealbumin  Weekly,   R      12/13/22 1839            Total time spent in review of labs and imaging, patient evaluation, formulation of plan, documentation and communication with family: 45 minutes  Signed, Lorin Glass, MD [facility] 12/29/2022

## 2022-12-30 DIAGNOSIS — K90829 Short bowel syndrome, unspecified: Secondary | ICD-10-CM | POA: Diagnosis not present

## 2022-12-30 LAB — CBC
HCT: 22 % — ABNORMAL LOW (ref 36.0–46.0)
Hemoglobin: 7 g/dL — ABNORMAL LOW (ref 12.0–15.0)
MCH: 30.2 pg (ref 26.0–34.0)
MCHC: 31.8 g/dL (ref 30.0–36.0)
MCV: 94.8 fL (ref 80.0–100.0)
Platelets: 299 10*3/uL (ref 150–400)
RBC: 2.32 MIL/uL — ABNORMAL LOW (ref 3.87–5.11)
RDW: 16.4 % — ABNORMAL HIGH (ref 11.5–15.5)
WBC: 17.1 10*3/uL — ABNORMAL HIGH (ref 4.0–10.5)
nRBC: 0 % (ref 0.0–0.2)

## 2022-12-30 LAB — HEPARIN LEVEL (UNFRACTIONATED): Heparin Unfractionated: <0.1 IU/mL — ABNORMAL LOW (ref 0.30–0.70)

## 2022-12-30 LAB — COMPREHENSIVE METABOLIC PANEL
ALT: 23 U/L (ref 0–44)
AST: 24 U/L (ref 15–41)
Albumin: <1.5 g/dL — ABNORMAL LOW (ref 3.5–5.0)
Alkaline Phosphatase: 149 U/L — ABNORMAL HIGH (ref 38–126)
Anion gap: 9 (ref 5–15)
BUN: 40 mg/dL — ABNORMAL HIGH (ref 6–20)
CO2: 25 mmol/L (ref 22–32)
Calcium: 7.8 mg/dL — ABNORMAL LOW (ref 8.9–10.3)
Chloride: 102 mmol/L (ref 98–111)
Creatinine, Ser: 1.06 mg/dL — ABNORMAL HIGH (ref 0.44–1.00)
GFR, Estimated: >60 mL/min (ref >60)
Glucose, Bld: 129 mg/dL — ABNORMAL HIGH (ref 70–99)
Potassium: 4.1 mmol/L (ref 3.5–5.1)
Sodium: 136 mmol/L (ref 135–145)
Total Bilirubin: 0.6 mg/dL (ref 0.3–1.2)
Total Protein: 5.1 g/dL — ABNORMAL LOW (ref 6.5–8.1)

## 2022-12-30 LAB — GLUCOSE, CAPILLARY
Glucose-Capillary: 101 mg/dL — ABNORMAL HIGH (ref 70–99)
Glucose-Capillary: 90 mg/dL (ref 70–99)
Glucose-Capillary: 80 mg/dL (ref 70–99)
Glucose-Capillary: 127 mg/dL — ABNORMAL HIGH (ref 70–99)
Glucose-Capillary: 167 mg/dL — ABNORMAL HIGH (ref 70–99)

## 2022-12-30 LAB — MAGNESIUM: Magnesium: 1.9 mg/dL (ref 1.7–2.4)

## 2022-12-30 LAB — APTT: aPTT: 38 seconds — ABNORMAL HIGH (ref 24–36)

## 2022-12-30 LAB — TRIGLYCERIDES: Triglycerides: 119 mg/dL (ref <150)

## 2022-12-30 LAB — PHOSPHORUS: Phosphorus: 2.9 mg/dL (ref 2.5–4.6)

## 2022-12-30 LAB — PROTIME-INR
INR: 1.3 — ABNORMAL HIGH (ref 0.8–1.2)
Prothrombin Time: 16 seconds — ABNORMAL HIGH (ref 11.4–15.2)

## 2022-12-30 LAB — PREPARE RBC (CROSSMATCH)

## 2022-12-30 MED ORDER — METOPROLOL TARTRATE 50 MG PO TABS
50.0000 mg | ORAL_TABLET | Freq: Two times a day (BID) | ORAL | Status: DC
  Administered 2022-12-30 – 2023-01-05 (×14): 50 mg
  Filled 2022-12-30: qty 1
  Filled 2022-12-30 (×19): qty 2

## 2022-12-30 MED ORDER — SODIUM CHLORIDE 0.9% IV SOLUTION: Freq: Once | INTRAVENOUS | Status: AC

## 2022-12-30 MED ORDER — TRAVASOL 10 % IV SOLN
INTRAVENOUS | Status: AC
  Filled 2022-12-30: qty 940.8

## 2022-12-30 MED ORDER — HEPARIN (PORCINE) 25000 UT/250ML-% IV SOLN
1700.0000 [IU]/h | INTRAVENOUS | Status: DC
  Administered 2022-12-30: 1050 [IU]/h via INTRAVENOUS
  Administered 2022-12-31: 1450 [IU]/h via INTRAVENOUS
  Administered 2023-01-01 – 2023-01-03 (×4): 1700 [IU]/h via INTRAVENOUS
  Filled 2022-12-30 (×6): qty 250

## 2022-12-30 NOTE — Progress Notes (Addendum)
ANTICOAGULATION CONSULT NOTE   Pharmacy Consult for heparin  Indication: pulmonary embolus  Allergies  Allergen Reactions   Nsaids Other (See Comments)    Crohns disease/ IBD   Chlorhexidine Gluconate Itching    Able to use CHG swabs for ports, reaction with direct skin contact   Hydromorphone Other (See Comments)    Confusion/sedation   Lactose Intolerance (Gi) Other (See Comments)    Upset stomach/diarrhea    Patient Measurements: Height: 5\' 4"  (162.6 cm) Weight: 63.4 kg (139 lb 12.4 oz) IBW/kg (Calculated) : 54.7 Heparin Dosing Weight: 63.4 kg Vital Signs: Temp: 99.5 F (37.5 C) (09/21 1115) Temp Source: Bladder (09/21 1128) BP: 127/78 (09/21 1128) Pulse Rate: 120 (09/21 1115)  Labs: Recent Labs    12/28/22 0346 12/29/22 0529 12/30/22 0514  HGB 7.6* 7.2* 7.0*  HCT 23.3* 22.6* 22.0*  PLT 280 275 299  CREATININE 1.46*  --  1.06*    Estimated Creatinine Clearance: 57.9 mL/min (A) (by C-G formula based on SCr of 1.06 mg/dL (H)).  Medications:  Medications Prior to Admission  Medication Sig Dispense Refill Last Dose   acetaminophen (TYLENOL) 500 MG tablet Take 500-1,000 mg by mouth every 6 (six) hours as needed for mild pain.   12/12/2022   cholecalciferol (VITAMIN D3) 25 MCG (1000 UNIT) tablet Take 1,000 Units by mouth daily.   12/12/2022   hyoscyamine (LEVSIN SL) 0.125 MG SL tablet Place 1 tablet (0.125 mg total) under the tongue every 6 (six) hours as needed for cramping. 30 tablet 0 unknown   Multiple Vitamins-Minerals (MULTIVITAMIN WITH MINERALS) tablet Take 1 tablet by mouth daily.   Past Week   nystatin (MYCOSTATIN) 100000 UNIT/ML suspension Take 5 mLs (500,000 Units total) by mouth 4 (four) times daily. 473 mL 0 Past Month   potassium chloride SA (KLOR-CON M) 20 MEQ tablet Take 1 tablet (20 mEq total) by mouth daily. 90 tablet 2 12/12/2022   [EXPIRED] predniSONE (DELTASONE) 20 MG tablet Take 2 tablets (40 mg total) by mouth daily with breakfast for 7 days, THEN 1.5  tablets (30 mg total) daily with breakfast for 7 days, THEN 1 tablet (20 mg total) daily with breakfast for 21 days. 45.5 tablet 0 12/12/2022   simethicone (MYLICON) 80 MG chewable tablet Chew 1 tablet (80 mg total) by mouth 4 (four) times daily as needed for flatulence. 30 tablet 0 unknown    Assessment: Patient was placed on heparin drip from 9/10-9/12 for new LLL PE. On 9/12 heparin drip was discontinued due to hemoglobin drop and blood in ostomy.    On 9/21:  Pharmacy has been consulted to resume heparin drip.  Today, 12/30/22 - Hgb = 7, received transfusion (1 unit PRBCs) today, 9/21. - Plt = 299, WNL - Per Dr. Pola Corn there is no evidence of active bleeding at this time; per MD start heparin drip after transfusion.    Goal of Therapy:  Heparin level 0.3-0.7 units/ml Monitor platelets by anticoagulation protocol: Yes   Plan:  - Omit bolus per MD - Start Heparin infusion at 1050 units/hr - ordered aPTT and INR baseline today. - obtain heparin level in 6 hours  - Daily CBC & heparin level - monitor for signs/symptoms of bleeding.  Tacy Learn, PharmD Clinical Pharmacist 12/30/2022 12:10 PM

## 2022-12-30 NOTE — Progress Notes (Addendum)
Inpatient Rehab Admissions:  Inpatient Rehab Consult received.  I spoke with patient and daughter [name] on the telephone for rehabilitation assessment and to discuss goals and expectations of an inpatient rehab admission.  Discussed average length of stay, insurance authorization requirement, and discharge home after completion of CIR. Both acknowledged understanding. Pt interested in pursuing CIR and daughter supportive. Trinity confirmed that she will be able to provide support for pt during non-working hours. Will discuss with physiatrist pt's dispo. Will continue to follow.  1126: Discussed dispo with physiatrist. As of now, pt would not have the appropriate amount of support after discharge. Spoke with pt's daughter [name] to update her. She acknowledged understanding. Attempted to contact pt to update her. TOC made aware.   Signed: Wolfgang Phoenix, MS, CCC-SLP Admissions Coordinator [phone]

## 2022-12-30 NOTE — Plan of Care (Signed)
Problem: Education: Goal: Ability to describe self-care measures that may prevent or decrease complications (Diabetes Survival Skills Education) will improve 12/30/2022 0504 by Randel Books, RN Outcome: Progressing 12/30/2022 0504 by Randel Books, RN Outcome: Progressing Goal: Individualized Educational Video(s) 12/30/2022 0504 by Randel Books, RN Outcome: Progressing 12/30/2022 0504 by Randel Books, RN Outcome: Progressing   Problem: Coping: Goal: Ability to adjust to condition or change in health will improve 12/30/2022 0504 by Randel Books, RN Outcome: Progressing 12/30/2022 0504 by Randel Books, RN Outcome: Progressing   Problem: Fluid Volume: Goal: Ability to maintain a balanced intake and output will improve 12/30/2022 0504 by Randel Books, RN Outcome: Progressing 12/30/2022 0504 by Randel Books, RN Outcome: Progressing   Problem: Health Behavior/Discharge Planning: Goal: Ability to identify and utilize available resources and services will improve 12/30/2022 0504 by Randel Books, RN Outcome: Progressing 12/30/2022 0504 by Randel Books, RN Outcome: Progressing Goal: Ability to manage health-related needs will improve 12/30/2022 0504 by Randel Books, RN Outcome: Progressing 12/30/2022 0504 by Randel Books, RN Outcome: Progressing   Problem: Metabolic: Goal: Ability to maintain appropriate glucose levels will improve 12/30/2022 0504 by Randel Books, RN Outcome: Progressing 12/30/2022 0504 by Randel Books, RN Outcome: Progressing   Problem: Nutritional: Goal: Maintenance of adequate nutrition will improve 12/30/2022 0504 by Randel Books, RN Outcome: Progressing 12/30/2022 0504 by Randel Books, RN Outcome: Progressing Goal: Progress toward achieving an optimal weight will improve 12/30/2022 0504 by Randel Books, RN Outcome: Progressing 12/30/2022 0504 by Randel Books, RN Outcome: Progressing   Problem: Skin Integrity: Goal: Risk for impaired skin integrity will  decrease 12/30/2022 0504 by Randel Books, RN Outcome: Progressing 12/30/2022 0504 by Randel Books, RN Outcome: Progressing   Problem: Tissue Perfusion: Goal: Adequacy of tissue perfusion will improve 12/30/2022 0504 by Randel Books, RN Outcome: Progressing 12/30/2022 0504 by Randel Books, RN Outcome: Progressing   Problem: Education: Goal: Knowledge of General Education information will improve Description: Including pain rating scale, medication(s)/side effects and non-pharmacologic comfort measures 12/30/2022 0504 by Randel Books, RN Outcome: Progressing 12/30/2022 0504 by Randel Books, RN Outcome: Progressing   Problem: Health Behavior/Discharge Planning: Goal: Ability to manage health-related needs will improve 12/30/2022 0504 by Randel Books, RN Outcome: Progressing 12/30/2022 0504 by Randel Books, RN Outcome: Progressing   Problem: Clinical Measurements: Goal: Ability to maintain clinical measurements within normal limits will improve 12/30/2022 0504 by Randel Books, RN Outcome: Progressing 12/30/2022 0504 by Randel Books, RN Outcome: Progressing Goal: Will remain free from infection 12/30/2022 0504 by Randel Books, RN Outcome: Progressing 12/30/2022 0504 by Randel Books, RN Outcome: Progressing Goal: Diagnostic test results will improve 12/30/2022 0504 by Randel Books, RN Outcome: Progressing 12/30/2022 0504 by Randel Books, RN Outcome: Progressing Goal: Respiratory complications will improve 12/30/2022 0504 by Randel Books, RN Outcome: Progressing 12/30/2022 0504 by Randel Books, RN Outcome: Progressing Goal: Cardiovascular complication will be avoided 12/30/2022 0504 by Randel Books, RN Outcome: Progressing 12/30/2022 0504 by Randel Books, RN Outcome: Progressing   Problem: Activity: Goal: Risk for activity intolerance will decrease 12/30/2022 0504 by Randel Books, RN Outcome: Progressing 12/30/2022 0504 by Randel Books, RN Outcome: Progressing   Problem:  Nutrition: Goal: Adequate nutrition will be maintained 12/30/2022 0504 by Randel Books, RN Outcome: Progressing 12/30/2022 0504 by Randel Books, RN Outcome: Progressing  Problem: Coping: Goal: Level of anxiety will decrease 12/30/2022 0504 by Randel Books, RN Outcome: Progressing 12/30/2022 0504 by Randel Books, RN Outcome: Progressing   Problem: Elimination: Goal: Will not experience complications related to bowel motility 12/30/2022 0504 by Randel Books, RN Outcome: Progressing 12/30/2022 0504 by Randel Books, RN Outcome: Progressing Goal: Will not experience complications related to urinary retention 12/30/2022 0504 by Randel Books, RN Outcome: Progressing 12/30/2022 0504 by Randel Books, RN Outcome: Progressing   Problem: Pain Managment: Goal: General experience of comfort will improve 12/30/2022 0504 by Randel Books, RN Outcome: Progressing 12/30/2022 0504 by Randel Books, RN Outcome: Progressing   Problem: Safety: Goal: Ability to remain free from injury will improve 12/30/2022 0504 by Randel Books, RN Outcome: Progressing 12/30/2022 0504 by Randel Books, RN Outcome: Progressing   Problem: Skin Integrity: Goal: Risk for impaired skin integrity will decrease 12/30/2022 0504 by Randel Books, RN Outcome: Progressing 12/30/2022 0504 by Randel Books, RN Outcome: Progressing

## 2022-12-30 NOTE — Progress Notes (Signed)
ANTICOAGULATION CONSULT NOTE   Pharmacy Consult for heparin  Indication: pulmonary embolus  Allergies  Allergen Reactions   Nsaids Other (See Comments)    Crohns disease/ IBD   Chlorhexidine Gluconate Itching    Able to use CHG swabs for ports, reaction with direct skin contact   Hydromorphone Other (See Comments)    Confusion/sedation   Lactose Intolerance (Gi) Other (See Comments)    Upset stomach/diarrhea    Patient Measurements: Height: 5\' 4"  (162.6 cm) Weight: 63.4 kg (139 lb 12.4 oz) IBW/kg (Calculated) : 54.7 Heparin Dosing Weight: 63.4 kg Vital Signs: Temp: 99.7 F (37.6 C) (09/21 2000) Temp Source: Bladder (09/21 1320) BP: 137/75 (09/21 2139) Pulse Rate: 123 (09/21 2139)  Labs: Recent Labs    12/28/22 0346 12/29/22 0529 12/30/22 0514 12/30/22 1338 12/30/22 2128  HGB 7.6* 7.2* 7.0*  --   --   HCT 23.3* 22.6* 22.0*  --   --   PLT 280 275 299  --   --   APTT  --   --   --  38*  --   LABPROT  --   --   --  16.0*  --   INR  --   --   --  1.3*  --   HEPARINUNFRC  --   --   --   --  <0.10*  CREATININE 1.46*  --  1.06*  --   --     Estimated Creatinine Clearance: 57.9 mL/min (A) (by C-G formula based on SCr of 1.06 mg/dL (H)).  Medications:  Medications Prior to Admission  Medication Sig Dispense Refill Last Dose   acetaminophen (TYLENOL) 500 MG tablet Take 500-1,000 mg by mouth every 6 (six) hours as needed for mild pain.   12/12/2022   cholecalciferol (VITAMIN D3) 25 MCG (1000 UNIT) tablet Take 1,000 Units by mouth daily.   12/12/2022   hyoscyamine (LEVSIN SL) 0.125 MG SL tablet Place 1 tablet (0.125 mg total) under the tongue every 6 (six) hours as needed for cramping. 30 tablet 0 unknown   Multiple Vitamins-Minerals (MULTIVITAMIN WITH MINERALS) tablet Take 1 tablet by mouth daily.   Past Week   nystatin (MYCOSTATIN) 100000 UNIT/ML suspension Take 5 mLs (500,000 Units total) by mouth 4 (four) times daily. 473 mL 0 Past Month   potassium chloride SA (KLOR-CON  M) 20 MEQ tablet Take 1 tablet (20 mEq total) by mouth daily. 90 tablet 2 12/12/2022   [EXPIRED] predniSONE (DELTASONE) 20 MG tablet Take 2 tablets (40 mg total) by mouth daily with breakfast for 7 days, THEN 1.5 tablets (30 mg total) daily with breakfast for 7 days, THEN 1 tablet (20 mg total) daily with breakfast for 21 days. 45.5 tablet 0 12/12/2022   simethicone (MYLICON) 80 MG chewable tablet Chew 1 tablet (80 mg total) by mouth 4 (four) times daily as needed for flatulence. 30 tablet 0 unknown    Assessment: Patient was placed on heparin drip from 9/10-9/12 for new LLL PE. On 9/12 heparin drip was discontinued due to hemoglobin drop and blood in ostomy.    On 9/21:  Pharmacy has been consulted to resume heparin drip.  Today, 12/30/22 - Hgb = 7, received transfusion (1 unit PRBCs) today, 9/21. - Plt = 299, WNL - Per Dr. Pola Corn there is no evidence of active bleeding at this time; per MD start heparin drip after transfusion.     2128 HL < 0.1 subtherapeutic on 1050 units/hr No bleeding or interruptions per RN  Goal of  Therapy:  Heparin level 0.3-0.7 units/ml Monitor platelets by anticoagulation protocol: Yes   Plan:  - Omit bolus per MD - increase heparin drip to 1250 units/hr -- obtain heparin level in 6 hours  - Daily CBC & heparin level - monitor for signs/symptoms of bleeding.  Arley Phenix RPh 12/30/2022, 10:52 PM

## 2022-12-30 NOTE — Progress Notes (Addendum)
PHARMACY - TOTAL PARENTERAL NUTRITION CONSULT NOTE   Indication: Short bowel syndrome  Patient Measurements: Height: 5\' 4"  (162.6 cm) Weight: 63.4 kg (139 lb 12.4 oz) IBW/kg (Calculated) : 54.7   Body mass index is 23.99 kg/m. Usual Weight: 46.3 kg from 8/28  Assessment: 47 y.o. female with PMH of recurrent breast cancer not currently on treatment, Crohn's disease s/p subtotal colectomy in 2021, recurrent obstructions, colonic stricture, recent hospitalization 8/10-8/15/2024 for obstruction who presented on 12/12/2022 with abdominal pain, nausea, vomiting diarrhea.    9/4 Admit; found down in ED bathroom with no pulse, CPR > intubated 9/5 s/p ex-lap, found to have internal hernia with small bowel ischemia, only 105 cm of small bowel remaining OR 9/7 for closure, ostomy creation 9/8 pharmacy consulted to manage TPN 9/15 Surgery requesting transition to cyclic TPN  Glucose / Insulin: no Hx DM - steroids PTA for Crohn's disease; weaned off as of 9/13 - CBGs goal < 180.Marland Kitchen AM Glucose= 129. Range past 24hrs: 100-126; 1 unit SSI given in past 24hrs - On SSI (sensitive scale) Electrolytes: all electrolytes WNL, including CorrCa (9.88) Renal: Baseline SCr <1. AKI s/p arrest, Scr has improved; BUN remains elevated but fairly stable. Hepatic: Alk phos mildly elevated at149.  AST/ATL/T.bili WNL. Albumin remains low.  - TG (9/11) normalized after stopping propofol - TG (9/21)= 119 I/O:  - 9/20 7am - 9/21 7am:  UOP: 1650 mL, ileostomy: 1700 mL - getting fiber, iron, loperamide per tube GI Imaging:  - CTA 9/10 shows new PE, but also abdominal ascites - CT Abd: Large volume ascites GI Surgeries / Procedures:  - OR 9/5 ex lap, internal hernia w/ small bowel ischemia, SBR & celiotomy; > 105 cm small bowel remaining; - OR 9/7 for closure, ostomy creation -9/17: Paracentesis  Central access: triple lumen CVC R subclavian 9/4, triple lumen CVC R femoral 9/4;  PAC 05/02/22 TPN start date:  12/17/22  Nutritional Goals: TPN provides 94 g protein and 1680 kcal per day  RD Assessment: Estimated Needs Total Energy Estimated Needs: 1600-1800 Total Protein Estimated Needs: 85-95 grams Total Fluid Estimated Needs: 1.6L/day  Current Nutrition:  TPN  9/18: Trickle TF > per RD on 9/20 plan to continue trickle TF overnight 9/20 Molli Posey Supplement po bid 9/20: MBS -> diet advanced to DYS 1 with nectar thick liquids   Spoke with Dietician on 9/20 -- over weekend will plan to keep TPN at goal rate; for now will not count supplements in total calorie count.  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  CCS MD requesting repletion of vitamins ADEK.  -Vitamin A level pending prior to supplementation > Dietician to follow -Vitamin K IV weekly, Vitamin D and E per tube  Plan: Continue TPN cycle on 12 hr cycle at 18:00 tonight Anticipate prolonged/indefinite TPN dependency Max glucose infusion rate 5.63 mg/kg/min Electrolytes in TPN: No change Na - 80 mEq/L K - 55 mEq/L Ca - 5 mEq/L Mg - 10 mEq/L Phos - 15 mmol/L Cl:Ac ratio - 1:2 Add standard MVI and trace elements to TPN Chromium on hold d/t critical shortage Continue CBG checks 4x daily while cycling TPN On TPN Off TPN 1-2 hr after completing ramp-up 1 hr after TPN stopped Monitor TPN labs on Mon/Thurs and as needed  Tacy Learn, PharmD Clinical Pharmacist 12/30/2022 11:15 AM

## 2022-12-30 NOTE — Progress Notes (Signed)
PROGRESS NOTE  [name]  DOB: 12-Jun-1975  PCP: Ollen Bowl, MD ZOX:[id number]  DOA: 12/12/2022  LOS: 17 days  Hospital Day: 19  Brief narrative: [name] is a 47 y.o. female with PMH significant for Crohn's disease with previous bowel obstruction s/p subtotal colectomy 2021, colonic stricture, IDA, breast cancer s/p mastectomy 2022 currently not on treatment. Recently hospitalized 8/10-8/15/2024 for bowel obstruction seen by GI and general surgery. Underwent EGD and flexible sigmoidoscopy and discharged on steroids.  9/3, patient presented to the ED with abdominal distention, pain, nausea, vomiting CT abd/pelvis showed markedly dilated distal small bowel consistent with chronic obstruction. No changes since prior study.  Patient was admitted to [facility] with a plan of GI consultation.  9/4, in the morning, within few hours of admission, patient was found pulseless on the floor in the bathroom in ED, estimated downtime about 5 minutes. ACLS was initiated. Concern for possible torsades de pointes and given IV mag, defibrillated.  ROSC achieved in about 5 minutes. She was intubated. She had copious vomitus in the ETT.  Notably, she had I-stat peri-code which showed hemoglobin of 5.5 down from 10 overnight.  Admitted to ICU In the ICU, patient had right femoral CVC placed.  Given 3 minutes of PRBC, 3 units of FFP.  Patient was started on vasopressors  9/5, underwent exploratory laparotomy.  Noted to have internal hernia with associated small bowel ischemia.  Underwent bowel resection. 9/7, back to OR for ostomy placement 9/8, weaned off Levophed 9/10, extubated 9/10, found to have PE in CT angio chest.  Started on heparin drip which was subsequently stopped the next day because of drop in hemoglobin 9/12, mental status improved, became more interactive.  However failed swallow eval. 9/16, transferred out to [facility] 9/16, CT abdomen showed peritoneal fluid collection 9/17,  underwent paracentesis, 500 cc of clear yellow fluid drained  Subjective: Patient was seen and examined this morning.  Propped up in bed.  Not in distress. No family at bedside. Fever spikes improving.  Remains tachycardic however. Labs from this morning with hemoglobin low at 7.  Assessment and plan: Internal hernia with bowel ischemia  Short gut syndrome Dysphagia s/p ex lap with small bowel resection -9/5 Dr. Hillery Hunter s/p ex lap, ileostomy, abdominal closure 9/7 Dr. Hillery Hunter H/o Crohn's disease s/p prior subtotal colectom Currently only has 100 cm of small bowel left.  High ostomy output.   TPN dependent To slow down ostomy output, patient is currently on Imodium, iron, FiberCon, Imodium General Surgery following.   Speech therapy reevaluation obtained.  Swallowing improving.  Tube feeding stopped.  Currently on dysphagia 1 diet.  Persistent fever Patient continues to have persistent fever. Extensive workup done.  Blood cultures sent yesterday.  Fungal culture sent with as well. 9/19, ID consulted.  Currently on ciprofloxacin, Flagyl and micafungin. Fever spikes improving it seems.  WC count improving as well. Recent Labs  Lab 12/26/22 0500 12/27/22 0359 12/28/22 0346 12/29/22 0529 12/30/22 0514  WBC 27.4* 23.6* 21.0* 18.2* 17.1*   Persistent tachycardia Multfactorial: Sepsis, fever, effective hypovolemia, PE Blood pressure is stable.  Currently on Metropol 25 mg twice daily.  Increase to 50 mg twice daily today. Continue monitor  AKI Likely due to ATN related to septic shock. Improving currently. Recent Labs    12/20/22 0347 12/21/22 1027 12/22/22 0445 12/23/22 0803 12/24/22 0408 12/25/22 0334 12/26/22 0500 12/27/22 0823 12/28/22 0346 12/30/22 0514  BUN 45* 51* 53* 57* 52* 51* 57* 53* 48* 40*  CREATININE 1.67* 1.40* 1.49* 1.61* 1.48* 1.41* 1.76* 1.47* 1.46* 1.06*   Acute on chronic anemia Hemoglobin trend as below.  Continue to show gradual drop.   Hemoglobin down to 7 today.  Ordered 100 PRBC transfusion. Received a total of 9 units of PRBC so far. Recent Labs    11/20/22 0252 11/21/22 0308 12/26/22 0500 12/27/22 0359 12/28/22 0346 12/29/22 0529 12/30/22 0514  HGB 8.9*   < > 6.9* 8.2* 7.6* 7.2* 7.0*  MCV 79.2*   < > 90.0 89.4 91.4 93.4 94.8  VITAMINB12 1,244*  --   --   --   --   --   --   FOLATE 16.2  --   --   --   --   --   --   FERRITIN 160  --   --   --   --   --   --   TIBC 118*  --   --   --   --   --   --   IRON 22*  --   --   --   --   --   --   RETICCTPCT 0.9  --   --   --   --   --   --    < > = values in this interval not displayed.   Pulmonary embolism Left lower lobe segmental Currently heparin on hold.  There is no evidence of active bleeding but hemoglobin downtrends. I will resume heparin drip today without bolus and monitor.   Hyperglycemia A1c 5.7 on 9//24 Continue SSI/Accu-Cheks Recent Labs  Lab 12/29/22 1132 12/29/22 2021 12/29/22 2305 12/30/22 0734 12/30/22 1123  GLUCAP 100* 126* 112* 101* 90    Recurrent breast cancer: Follows up with oncology Dr. Pamelia Hoit   Mobility: Encourage ambulation once hemodynamically stabilizes  Goals of care   Code Status: Full Code     DVT prophylaxis:  Place and maintain sequential compression device Start: 12/15/22 1031 SCDs Start: 12/13/22 0413   Antimicrobials: IV ciprofloxacin, IV Flagyl, micafungin Fluid: TPN Consultants: General surgery, ID Family Communication: Daughter not at bedside today  Status: Inpatient Level of care:  Stepdown   Patient is from: Home Needs to continue in-hospital care: Remains  tachycardic, has significant output from ostomy, on TPN Anticipated d/c to: Pending clinical course   Diet:  Diet Order             DIET - DYS 1 Fluid consistency: Nectar Thick  Diet effective now                   Scheduled Meds:  sodium chloride   Intravenous Once   acetaminophen  1,000 mg Per Tube TID   ascorbic acid  500  mg Per Tube BID   famotidine  20 mg Per Tube Daily   feeding supplement (KATE FARMS STANDARD 1.4)  180 mL Per Tube Daily   feeding supplement (KATE FARMS STANDARD 1.4)  325 mL Oral BID BM   ferrous sulfate  300 mg Per Tube BID   fiber  1 packet Per Tube BID   gabapentin  200 mg Per Tube Q8H   insulin aspart  0-9 Units Subcutaneous 4 times per day   loperamide HCl  4 mg Per Tube QID   metoprolol tartrate  50 mg Per Tube BID   sodium chloride flush  10-40 mL Intracatheter Q12H   vitamin E  100 Units Per Tube Daily   zinc sulfate  220 mg Per  Tube Daily    PRN meds: albuterol, artificial tears, fentaNYL (SUBLIMAZE) injection, food thickener, labetalol, lip balm, LORazepam, [DISCONTINUED] ondansetron **OR** ondansetron (ZOFRAN) IV, mouth rinse, oxyCODONE, silver nitrate applicators, sodium chloride flush   Infusions:   ciprofloxacin 400 mg (12/30/22 1108)   metronidazole Stopped (12/30/22 0022)   micafungin (MYCAMINE) 100 mg in sodium chloride 0.9 % 100 mL IVPB Stopped (12/29/22 1714)   phytonadione (VITAMIN K) 2 mg in dextrose 5 % 50 mL IVPB Stopped (12/28/22 1642)   TPN CYCLIC-ADULT (ION)      Antimicrobials: Anti-infectives (From admission, onward)    Start     Dose/Rate Route Frequency Ordered Stop   12/29/22 1215  ciprofloxacin (CIPRO) IVPB 400 mg        400 mg 200 mL/hr over 60 Minutes Intravenous 2 times daily 12/29/22 1123     12/29/22 1215  metroNIDAZOLE (FLAGYL) IVPB 500 mg        500 mg 100 mL/hr over 60 Minutes Intravenous 2 times daily 12/29/22 1123     12/28/22 1430  micafungin (MYCAMINE) 100 mg in sodium chloride 0.9 % 100 mL IVPB        100 mg 105 mL/hr over 1 Hours Intravenous Every 24 hours 12/28/22 1335     12/23/22 2000  vancomycin (VANCOREADY) IVPB 1250 mg/250 mL  Status:  Discontinued        1,250 mg 166.7 mL/hr over 90 Minutes Intravenous Every 36 hours 12/22/22 0612 12/22/22 1103   12/22/22 0645  vancomycin (VANCOREADY) IVPB 1250 mg/250 mL        1,250  mg 166.7 mL/hr over 90 Minutes Intravenous  Once 12/22/22 0551 12/22/22 0805   12/22/22 0600  piperacillin-tazobactam (ZOSYN) IVPB 3.375 g  Status:  Discontinued        3.375 g 12.5 mL/hr over 240 Minutes Intravenous Every 8 hours 12/22/22 0551 12/29/22 1123   12/13/22 1000  piperacillin-tazobactam (ZOSYN) IVPB 3.375 g  Status:  Discontinued        3.375 g 12.5 mL/hr over 240 Minutes Intravenous Every 8 hours 12/13/22 0833 12/20/22 1333       Objective: Vitals:   12/30/22 1115 12/30/22 1128  BP:  127/78  Pulse: (!) 120   Resp: 20   Temp: 99.5 F (37.5 C)   SpO2: 100%     Intake/Output Summary (Last 24 hours) at 12/30/2022 1145 Last data filed at 12/30/2022 1108 Gross per 24 hour  Intake 2762.25 ml  Output 4350 ml  Net -1587.75 ml   Filed Weights   12/28/22 0500 12/29/22 0500 12/30/22 0500  Weight: 63.4 kg 59.4 kg 63.4 kg   Weight change: 4 kg Body mass index is 23.99 kg/m.   Physical Exam: General exam: Pleasant, [name].  Thin built Skin: No rashes, lesions or ulcers. HEENT: Atraumatic, normocephalic, no obvious bleeding.  Dobbhoff tube remains clamped Lungs: Diminished air entry bilaterally.  Clear to auscultation bilaterally CVS: Has persistent tachycardia, regular rhythm, no murmur GI/Abd soft, ostomy bag attached to drain, with greenish output  CNS: Alert, awake, oriented x 3 Psychiatry: Sad affect Extremities: Trace bilateral pedal edema, no calf tenderness  Data Review: I have personally reviewed the laboratory data and studies available.  F/u labs ordered Unresulted Labs (From admission, onward)     Start     Ordered   01/01/23 0500  Triglycerides  Every Mon,Thu (0500),   R     Question:  Specimen collection method  Answer:  Unit=Unit collect   12/30/22 1118  12/31/22 0500  Magnesium  Tomorrow morning,   R       Question:  Specimen collection method  Answer:  Unit=Unit collect   12/30/22 1117   12/31/22 0500  Phosphorus   Tomorrow morning,   R       Question:  Specimen collection method  Answer:  Unit=Unit collect   12/30/22 1117   12/30/22 0500  Basic metabolic panel  Daily,   R     Question:  Specimen collection method  Answer:  Unit=Unit collect   12/29/22 0854   12/28/22 0500  Vitamin A  Tomorrow morning,   R       Question:  Specimen collection method  Answer:  Unit=Unit collect   12/27/22 1103   12/28/22 0500  Vitamin E  Tomorrow morning,   R       Question:  Specimen collection method  Answer:  Unit=Unit collect   12/27/22 1103   12/28/22 0500  Vitamin K1, Serum  Tomorrow morning,   R       Question:  Specimen collection method  Answer:  Unit=Unit collect   12/27/22 1103   12/27/22 0844  Expectorated Sputum Assessment w Gram Stain, Rflx to Resp Cult  Once,   R        12/27/22 0902   12/25/22 0600  Comprehensive metabolic panel  Every Mon,Thu,   R     Question:  Specimen collection method  Answer:  Unit=Unit collect   12/21/22 0846   12/20/22 0500  CBC  Daily,   R     Comments: While on heparin drip   Question:  Specimen collection method  Answer:  Unit=Unit collect   12/19/22 1720   12/18/22 0500  Magnesium  (Standard TPN Labs (all labs to be drawn at 0500))  Every Mon,Thu (0500),   R     Question:  Specimen collection method  Answer:  Unit=Unit collect   12/17/22 1152   12/18/22 0500  Phosphorus  (Standard TPN Labs (all labs to be drawn at 0500))  Every Mon,Thu (0500),   R     Question:  Specimen collection method  Answer:  Unit=Unit collect   12/17/22 1152   12/14/22 0000  Prealbumin  Weekly,   R      12/13/22 1839            Total time spent in review of labs and imaging, patient evaluation, formulation of plan, documentation and communication with family: 55 minutes  Signed, Lorin Glass, MD [facility] 12/30/2022

## 2022-12-31 ENCOUNTER — Inpatient Hospital Stay (HOSPITAL_COMMUNITY): Payer: Managed Care, Other (non HMO)

## 2022-12-31 DIAGNOSIS — K90829 Short bowel syndrome, unspecified: Secondary | ICD-10-CM | POA: Diagnosis not present

## 2022-12-31 LAB — HEPARIN LEVEL (UNFRACTIONATED)
Heparin Unfractionated: 0.12 IU/mL — ABNORMAL LOW (ref 0.30–0.70)
Heparin Unfractionated: 0.18 IU/mL — ABNORMAL LOW (ref 0.30–0.70)
Heparin Unfractionated: 0.42 IU/mL (ref 0.30–0.70)

## 2022-12-31 LAB — GLUCOSE, CAPILLARY
Glucose-Capillary: 136 mg/dL — ABNORMAL HIGH (ref 70–99)
Glucose-Capillary: 138 mg/dL — ABNORMAL HIGH (ref 70–99)
Glucose-Capillary: 95 mg/dL (ref 70–99)
Glucose-Capillary: 96 mg/dL (ref 70–99)
Glucose-Capillary: 99 mg/dL (ref 70–99)

## 2022-12-31 LAB — CBC
HCT: 26.8 % — ABNORMAL LOW (ref 36.0–46.0)
Hemoglobin: 8.6 g/dL — ABNORMAL LOW (ref 12.0–15.0)
MCH: 28.3 pg (ref 26.0–34.0)
MCHC: 32.1 g/dL (ref 30.0–36.0)
MCV: 88.2 fL (ref 80.0–100.0)
Platelets: 326 10*3/uL (ref 150–400)
RBC: 3.04 MIL/uL — ABNORMAL LOW (ref 3.87–5.11)
RDW: 19.3 % — ABNORMAL HIGH (ref 11.5–15.5)
WBC: 18.7 10*3/uL — ABNORMAL HIGH (ref 4.0–10.5)
nRBC: 0 % (ref 0.0–0.2)

## 2022-12-31 LAB — BASIC METABOLIC PANEL
Anion gap: 8 (ref 5–15)
BUN: 35 mg/dL — ABNORMAL HIGH (ref 6–20)
CO2: 25 mmol/L (ref 22–32)
Calcium: 8.1 mg/dL — ABNORMAL LOW (ref 8.9–10.3)
Chloride: 101 mmol/L (ref 98–111)
Creatinine, Ser: 0.72 mg/dL (ref 0.44–1.00)
GFR, Estimated: >60 mL/min (ref >60)
Glucose, Bld: 121 mg/dL — ABNORMAL HIGH (ref 70–99)
Potassium: 4.3 mmol/L (ref 3.5–5.1)
Sodium: 134 mmol/L — ABNORMAL LOW (ref 135–145)

## 2022-12-31 LAB — AEROBIC/ANAEROBIC CULTURE W GRAM STAIN (SURGICAL/DEEP WOUND): Culture: NO GROWTH

## 2022-12-31 LAB — PHOSPHORUS: Phosphorus: 2.9 mg/dL (ref 2.5–4.6)

## 2022-12-31 LAB — MAGNESIUM: Magnesium: 2 mg/dL (ref 1.7–2.4)

## 2022-12-31 MED ORDER — TRAVASOL 10 % IV SOLN
INTRAVENOUS | Status: AC
  Filled 2022-12-31: qty 940.8

## 2022-12-31 MED ORDER — IOHEXOL 300 MG/ML  SOLN
100.0000 mL | Freq: Once | INTRAMUSCULAR | Status: AC | PRN
  Administered 2022-12-31: 100 mL via INTRAVENOUS

## 2022-12-31 MED ORDER — TRAVASOL 10 % IV SOLN
INTRAVENOUS | Status: DC
Start: 2022-12-31 — End: 2022-12-31

## 2022-12-31 NOTE — Progress Notes (Signed)
PROGRESS NOTE  [name]  DOB: Feb 22, 1976  PCP: Ollen Bowl, MD HYQ:[id number]  DOA: 12/12/2022  LOS: 18 days  Hospital Day: 20  Brief narrative: [name] is a 47 y.o. female with PMH significant for Crohn's disease with previous bowel obstruction s/p subtotal colectomy 2021, colonic stricture, IDA, breast cancer s/p mastectomy 2022 currently not on treatment. Recently hospitalized 8/10-8/15/2024 for bowel obstruction seen by GI and general surgery. Underwent EGD and flexible sigmoidoscopy and discharged on steroids.  9/3, patient presented to the ED with abdominal distention, pain, nausea, vomiting CT abd/pelvis showed markedly dilated distal small bowel consistent with chronic obstruction. No changes since prior study.  Patient was admitted to [facility] with a plan of GI consultation.  9/4, in the morning, within few hours of admission, patient was found pulseless on the floor in the bathroom in ED, estimated downtime about 5 minutes. ACLS was initiated. Concern for possible torsades de pointes and given IV mag, defibrillated.  ROSC achieved in about 5 minutes. She was intubated. She had copious vomitus in the ETT.  Notably, she had I-stat peri-code which showed hemoglobin of 5.5 down from 10 overnight.  Admitted to ICU In the ICU, patient had right femoral CVC placed.  Given 3 minutes of PRBC, 3 units of FFP.  Patient was started on vasopressors  9/5, underwent exploratory laparotomy.  Noted to have internal hernia with associated small bowel ischemia.  Underwent bowel resection. 9/7, back to OR for ostomy placement 9/8, weaned off Levophed 9/10, extubated 9/10, found to have PE in CT angio chest.  Started on heparin drip which was subsequently stopped the next day because of drop in hemoglobin 9/12, mental status improved, became more interactive.  However failed swallow eval. 9/16, transferred out to [facility] 9/16, CT abdomen showed peritoneal fluid collection 9/17,  underwent paracentesis, 500 cc of clear yellow fluid drained  Subjective: Patient was seen and examined this morning.  Lying down in bed.  Complains of abdominal pain.  Has intermittent fever Persistently tachycardic.  No family at bedside.  Assessment and plan: Internal hernia with bowel ischemia  Short gut syndrome Dysphagia s/p ex lap with small bowel resection -9/5 Dr. Hillery Hunter s/p ex lap, ileostomy, abdominal closure 9/7 Dr. Hillery Hunter H/o Crohn's disease s/p prior subtotal colectom Currently only has 100 cm of small bowel left.  High ostomy output.   TPN dependent To slow down ostomy output, patient is currently on Imodium, iron, FiberCon, Imodium General Surgery following.   Speech therapy reevaluation obtained.  Swallowing improving.  Currently on dysphagia 1 diet, tube feeding during the day and TPN during the night  Persistent fever Patient continues to have persistent fever. Extensive workup done.  Blood cultures sent yesterday.  Fungal culture sent with as well. 9/19, ID consulted.  Currently on ciprofloxacin, Flagyl and micafungin. Fever spikes intermittently.  WBC count remains elevated. Noted plan from general surgery to repeat CT abdomen today. Recent Labs  Lab 12/27/22 0359 12/28/22 0346 12/29/22 0529 12/30/22 0514 12/31/22 0504  WBC 23.6* 21.0* 18.2* 17.1* 18.7*   Persistent tachycardia Multfactorial: Sepsis, fever, effective hypovolemia, PE Blood pressure is stable.  Currently on Metropol 25 mg twice daily.  Increase to 50 mg twice daily today. Continue monitor  AKI Likely due to ATN related to septic shock. Improving currently. Recent Labs    12/21/22 1027 12/22/22 0445 12/23/22 0803 12/24/22 0408 12/25/22 0334 12/26/22 0500 12/27/22 0823 12/28/22 0346 12/30/22 0514 12/31/22 0504  BUN 51* 53* 57* 52* 51*  57* 53* 48* 40* 35*  CREATININE 1.40* 1.49* 1.61* 1.48* 1.41* 1.76* 1.47* 1.46* 1.06* 0.72   Pulmonary embolism Left lower lobe segmental  PE diagnosed on 9/10 Currently heparin on hold.  There is no evidence of active bleeding but hemoglobin downtrends. 9/21 heparin drip was resumed  Acute on chronic anemia Hemoglobin trend as below.  Received a total of 9 units of PRBC so far. Remains stable since initiation of heparin drip yesterday. Recent Labs    11/20/22 0252 11/21/22 0308 12/27/22 0359 12/28/22 0346 12/29/22 0529 12/30/22 0514 12/31/22 0504  HGB 8.9*   < > 8.2* 7.6* 7.2* 7.0* 8.6*  MCV 79.2*   < > 89.4 91.4 93.4 94.8 88.2  VITAMINB12 1,244*  --   --   --   --   --   --   FOLATE 16.2  --   --   --   --   --   --   FERRITIN 160  --   --   --   --   --   --   TIBC 118*  --   --   --   --   --   --   IRON 22*  --   --   --   --   --   --   RETICCTPCT 0.9  --   --   --   --   --   --    < > = values in this interval not displayed.    Hyperglycemia A1c 5.7 on 9//24 Continue SSI/Accu-Cheks Recent Labs  Lab 12/30/22 1622 12/30/22 1941 12/30/22 2304 12/31/22 0408 12/31/22 0827  GLUCAP 80 127* 167* 136* 95    Loss of voice Postextubation, patient was noted to have dysphagia as well as loss of voice. If no improvement, will involve ENT in 1 to 2 days.  Recurrent breast cancer: Follows up with oncology Dr. Pamelia Hoit   Mobility: Encourage ambulation once hemodynamically stabilizes  Goals of care   Code Status: Full Code     DVT prophylaxis:  Place and maintain sequential compression device Start: 12/15/22 1031 SCDs Start: 12/13/22 0413   Antimicrobials: IV ciprofloxacin, IV Flagyl, micafungin Fluid: TPN Consultants: General surgery, ID Family Communication: Daughter not at bedside today  Status: Inpatient Level of care:  Stepdown   Patient is from: Home Needs to continue in-hospital care: Remains  tachycardic, pending CT abdomen today Anticipated d/c to: Pending clinical course   Diet:  Diet Order             DIET - DYS 1 Fluid consistency: Nectar Thick  Diet effective now                    Scheduled Meds:  sodium chloride   Intravenous Once   acetaminophen  1,000 mg Per Tube TID   ascorbic acid  500 mg Per Tube BID   famotidine  20 mg Per Tube Daily   feeding supplement (KATE FARMS STANDARD 1.4)  180 mL Per Tube Daily   feeding supplement (KATE FARMS STANDARD 1.4)  325 mL Oral BID BM   ferrous sulfate  300 mg Per Tube BID   fiber  1 packet Per Tube BID   gabapentin  200 mg Per Tube Q8H   insulin aspart  0-9 Units Subcutaneous 4 times per day   loperamide HCl  4 mg Per Tube QID   metoprolol tartrate  50 mg Per Tube BID   sodium chloride flush  10-40 mL Intracatheter Q12H   vitamin E  100 Units Per Tube Daily   zinc sulfate  220 mg Per Tube Daily    PRN meds: albuterol, artificial tears, fentaNYL (SUBLIMAZE) injection, food thickener, labetalol, lip balm, LORazepam, [DISCONTINUED] ondansetron **OR** ondansetron (ZOFRAN) IV, mouth rinse, oxyCODONE, silver nitrate applicators, sodium chloride flush   Infusions:   ciprofloxacin Stopped (12/31/22 1101)   heparin 1,450 Units/hr (12/31/22 1059)   metronidazole Stopped (12/31/22 1101)   micafungin (MYCAMINE) 100 mg in sodium chloride 0.9 % 100 mL IVPB Stopped (12/30/22 1550)   phytonadione (VITAMIN K) 2 mg in dextrose 5 % 50 mL IVPB Stopped (12/28/22 1642)   TPN CYCLIC-ADULT (ION)      Antimicrobials: Anti-infectives (From admission, onward)    Start     Dose/Rate Route Frequency Ordered Stop   12/29/22 1215  ciprofloxacin (CIPRO) IVPB 400 mg        400 mg 200 mL/hr over 60 Minutes Intravenous 2 times daily 12/29/22 1123     12/29/22 1215  metroNIDAZOLE (FLAGYL) IVPB 500 mg        500 mg 100 mL/hr over 60 Minutes Intravenous 2 times daily 12/29/22 1123     12/28/22 1430  micafungin (MYCAMINE) 100 mg in sodium chloride 0.9 % 100 mL IVPB        100 mg 105 mL/hr over 1 Hours Intravenous Every 24 hours 12/28/22 1335     12/23/22 2000  vancomycin (VANCOREADY) IVPB 1250 mg/250 mL  Status:  Discontinued         1,250 mg 166.7 mL/hr over 90 Minutes Intravenous Every 36 hours 12/22/22 0612 12/22/22 1103   12/22/22 0645  vancomycin (VANCOREADY) IVPB 1250 mg/250 mL        1,250 mg 166.7 mL/hr over 90 Minutes Intravenous  Once 12/22/22 0551 12/22/22 0805   12/22/22 0600  piperacillin-tazobactam (ZOSYN) IVPB 3.375 g  Status:  Discontinued        3.375 g 12.5 mL/hr over 240 Minutes Intravenous Every 8 hours 12/22/22 0551 12/29/22 1123   12/13/22 1000  piperacillin-tazobactam (ZOSYN) IVPB 3.375 g  Status:  Discontinued        3.375 g 12.5 mL/hr over 240 Minutes Intravenous Every 8 hours 12/13/22 0833 12/20/22 1333       Objective: Vitals:   12/31/22 1115 12/31/22 1130  BP:    Pulse: (!) 112 (!) 112  Resp: 19 (!) 24  Temp: 99.9 F (37.7 C) 99.7 F (37.6 C)  SpO2: 100% 100%    Intake/Output Summary (Last 24 hours) at 12/31/2022 1131 Last data filed at 12/31/2022 4540 Gross per 24 hour  Intake 3257.51 ml  Output 4325 ml  Net -1067.49 ml   Filed Weights   12/29/22 0500 12/30/22 0500 12/31/22 0500  Weight: 59.4 kg 63.4 kg 65.3 kg   Weight change: 1.9 kg Body mass index is 24.71 kg/m.   Physical Exam: General exam: Pleasant, [name].  Thin built.  In partially controlled pain Skin: No rashes, lesions or ulcers. HEENT: Atraumatic, normocephalic, no obvious bleeding.  Dobbhoff tube remains clamped Lungs: Diminished air entry bilaterally.  Clear to auscultation bilaterally CVS: Has persistent tachycardia, regular rhythm, no murmur GI/Abd soft, ostomy bag attached to drain, with greenish output  CNS: Alert, awake, oriented x 3 Psychiatry: Sad affect Extremities: No pedal edema, no calf tenderness  Data Review: I have personally reviewed the laboratory data and studies available.  F/u labs ordered [employer] (From admission, onward)     Start  Ordered   01/01/23 0500  Triglycerides  Every Mon,Thu (0500),   R     Question:  Specimen collection method   Answer:  Unit=Unit collect   12/30/22 1118   01/01/23 0500  Heparin level (unfractionated)  Daily,   R     Question:  Specimen collection method  Answer:  Unit=Unit collect   12/31/22 0813   12/31/22 1430  Heparin level (unfractionated)  Once-Timed,   TIMED       Question:  Specimen collection method  Answer:  Unit=Unit collect   12/31/22 0813   12/30/22 0500  Basic metabolic panel  Daily,   R     Question:  Specimen collection method  Answer:  Unit=Unit collect   12/29/22 0854   12/28/22 0500  Vitamin A  Tomorrow morning,   R       Question:  Specimen collection method  Answer:  Unit=Unit collect   12/27/22 1103   12/28/22 0500  Vitamin E  Tomorrow morning,   R       Question:  Specimen collection method  Answer:  Unit=Unit collect   12/27/22 1103   12/28/22 0500  Vitamin K1, Serum  Tomorrow morning,   R       Question:  Specimen collection method  Answer:  Unit=Unit collect   12/27/22 1103   12/27/22 0844  Expectorated Sputum Assessment w Gram Stain, Rflx to Resp Cult  Once,   R        12/27/22 0902   12/25/22 0600  Comprehensive metabolic panel  Every Mon,Thu,   R     Question:  Specimen collection method  Answer:  Unit=Unit collect   12/21/22 0846   12/20/22 0500  CBC  Daily,   R     Comments: While on heparin drip   Question:  Specimen collection method  Answer:  Unit=Unit collect   12/19/22 1720   12/18/22 0500  Magnesium  (Standard TPN Labs (all labs to be drawn at 0500))  Every Mon,Thu (0500),   R     Question:  Specimen collection method  Answer:  Unit=Unit collect   12/17/22 1152   12/18/22 0500  Phosphorus  (Standard TPN Labs (all labs to be drawn at 0500))  Every Mon,Thu (0500),   R     Question:  Specimen collection method  Answer:  Unit=Unit collect   12/17/22 1152   12/14/22 0000  Prealbumin  Weekly,   R      12/13/22 1839            Total time spent in review of labs and imaging, patient evaluation, formulation of plan, documentation and communication  with family: 45 minutes  Signed, Lorin Glass, MD [facility] 12/31/2022

## 2022-12-31 NOTE — Progress Notes (Signed)
CT reviewed with radiologist  Large volume ascites noted.  This is present on previous CT scans is not a new finding.  There is more inflammation in the pelvis and some locules of air.  I suspect she has an abscess.  She is not an operative candidate for any further abdominal surgery due to the paucity of small bowel.  Overall she is stable and we will ask IR for drain placement to treat her.

## 2022-12-31 NOTE — Progress Notes (Signed)
ANTICOAGULATION CONSULT NOTE   Pharmacy Consult for heparin  Indication: pulmonary embolus  Allergies  Allergen Reactions   Nsaids Other (See Comments)    Crohns disease/ IBD   Chlorhexidine Gluconate Itching    Able to use CHG swabs for ports, reaction with direct skin contact   Hydromorphone Other (See Comments)    Confusion/sedation   Lactose Intolerance (Gi) Other (See Comments)    Upset stomach/diarrhea    Patient Measurements: Height: 5\' 4"  (162.6 cm) Weight: 65.3 kg (143 lb 15.4 oz) IBW/kg (Calculated) : 54.7 Heparin Dosing Weight: 63.4 kg Vital Signs: Temp: 99 F (37.2 C) (09/22 1630) Temp Source: Bladder (09/22 1600) BP: 98/64 (09/22 1600) Pulse Rate: 114 (09/22 1630)  Labs: Recent Labs    12/29/22 0529 12/30/22 0514 12/30/22 1338 12/30/22 2128 12/31/22 0504 12/31/22 1529  HGB 7.2* 7.0*  --   --  8.6*  --   HCT 22.6* 22.0*  --   --  26.8*  --   PLT 275 299  --   --  326  --   APTT  --   --  38*  --   --   --   LABPROT  --   --  16.0*  --   --   --   INR  --   --  1.3*  --   --   --   HEPARINUNFRC  --   --   --  <0.10* 0.12* 0.18*  CREATININE  --  1.06*  --   --  0.72  --     Estimated Creatinine Clearance: 76.7 mL/min (by C-G formula based on SCr of 0.72 mg/dL).  Medications:  Medications Prior to Admission  Medication Sig Dispense Refill Last Dose   acetaminophen (TYLENOL) 500 MG tablet Take 500-1,000 mg by mouth every 6 (six) hours as needed for mild pain.   12/12/2022   cholecalciferol (VITAMIN D3) 25 MCG (1000 UNIT) tablet Take 1,000 Units by mouth daily.   12/12/2022   hyoscyamine (LEVSIN SL) 0.125 MG SL tablet Place 1 tablet (0.125 mg total) under the tongue every 6 (six) hours as needed for cramping. 30 tablet 0 unknown   Multiple Vitamins-Minerals (MULTIVITAMIN WITH MINERALS) tablet Take 1 tablet by mouth daily.   Past Week   nystatin (MYCOSTATIN) 100000 UNIT/ML suspension Take 5 mLs (500,000 Units total) by mouth 4 (four) times daily. 473 mL 0  Past Month   potassium chloride SA (KLOR-CON M) 20 MEQ tablet Take 1 tablet (20 mEq total) by mouth daily. 90 tablet 2 12/12/2022   [EXPIRED] predniSONE (DELTASONE) 20 MG tablet Take 2 tablets (40 mg total) by mouth daily with breakfast for 7 days, THEN 1.5 tablets (30 mg total) daily with breakfast for 7 days, THEN 1 tablet (20 mg total) daily with breakfast for 21 days. 45.5 tablet 0 12/12/2022   simethicone (MYLICON) 80 MG chewable tablet Chew 1 tablet (80 mg total) by mouth 4 (four) times daily as needed for flatulence. 30 tablet 0 unknown    Assessment: Patient was placed on heparin drip from 9/10-9/12 for new LLL PE. On 9/12 heparin drip was discontinued due to hemoglobin drop and blood in ostomy.    On 9/21: Pharmacy has been consulted to resume heparin drip. - 9/21 Hgb= 7 >> received 1 unit PRBCs on 9/21. Start heparin drip after transfusion on 9/21.    Today, 12/31/22 -Heparin level 0.18, remains subtherapeutic with heparin infusion now at 1450 units/hr -Hgb low, but improved post transfusion; plt  stable -No complications of therapy noted  Goal of Therapy:  Heparin level 0.3-0.7 units/ml Monitor platelets by anticoagulation protocol: Yes   Plan:  -Omit bolus per MD -Increase heparin drip to 1700 units/hr -Check heparin level in 6 hours  -Daily CBC & heparin level -Monitor for signs/symptoms of bleeding.  Note plan for paracentesis with IR for 9/23. IR to comment/hold heparin as necessary pre-procedure.   Pricilla Riffle, PharmD, BCPS Clinical Pharmacist 12/31/2022 4:49 PM

## 2022-12-31 NOTE — Progress Notes (Signed)
15 Days Post-Op   Subjective/Chief Complaint: Pt without complaint  Low grade fever overnight  WBC up slightly    Objective: Vital signs in last 24 hours: Temp:  [98.6 F (37 C)-100.4 F (38 C)] 100.2 F (37.9 C) (09/22 0600) Pulse Rate:  [101-139] 118 (09/22 0600) Resp:  [16-25] 19 (09/22 0600) BP: (101-137)/(66-81) 119/73 (09/22 0600) SpO2:  [99 %-100 %] 100 % (09/22 0600) Weight:  [65.3 kg] 65.3 kg (09/22 0500) Last BM Date : 12/30/22  Intake/Output from previous day: 09/21 0701 - 09/22 0700 In: 3387.7 [I.V.:1813; Blood:403.3; NG/GT:475; IV Piggyback:696.4] Out: 4850 [Urine:3100; Drains:150; Stool:1600] Intake/Output this shift: No intake/output data recorded.  Incision/Wound: vac in place ostomy pin with contents in bag a little more thick   Lab Results:  Recent Labs    12/30/22 0514 12/31/22 0504  WBC 17.1* 18.7*  HGB 7.0* 8.6*  HCT 22.0* 26.8*  PLT 299 326   BMET Recent Labs    12/30/22 0514 12/31/22 0504  NA 136 134*  K 4.1 4.3  CL 102 101  CO2 25 25  GLUCOSE 129* 121*  BUN 40* 35*  CREATININE 1.06* 0.72  CALCIUM 7.8* 8.1*   PT/INR Recent Labs    12/30/22 1338  LABPROT 16.0*  INR 1.3*   ABG No results for input(s): "PHART", "HCO3" in the last 72 hours.  Invalid input(s): "PCO2", "PO2"  Studies/Results: No results found.  Anti-infectives: Anti-infectives (From admission, onward)    Start     Dose/Rate Route Frequency Ordered Stop   12/29/22 1215  ciprofloxacin (CIPRO) IVPB 400 mg        400 mg 200 mL/hr over 60 Minutes Intravenous 2 times daily 12/29/22 1123     12/29/22 1215  metroNIDAZOLE (FLAGYL) IVPB 500 mg        500 mg 100 mL/hr over 60 Minutes Intravenous 2 times daily 12/29/22 1123     12/28/22 1430  micafungin (MYCAMINE) 100 mg in sodium chloride 0.9 % 100 mL IVPB        100 mg 105 mL/hr over 1 Hours Intravenous Every 24 hours 12/28/22 1335     12/23/22 2000  vancomycin (VANCOREADY) IVPB 1250 mg/250 mL  Status:   Discontinued        1,250 mg 166.7 mL/hr over 90 Minutes Intravenous Every 36 hours 12/22/22 0612 12/22/22 1103   12/22/22 0645  vancomycin (VANCOREADY) IVPB 1250 mg/250 mL        1,250 mg 166.7 mL/hr over 90 Minutes Intravenous  Once 12/22/22 0551 12/22/22 0805   12/22/22 0600  piperacillin-tazobactam (ZOSYN) IVPB 3.375 g  Status:  Discontinued        3.375 g 12.5 mL/hr over 240 Minutes Intravenous Every 8 hours 12/22/22 0551 12/29/22 1123   12/13/22 1000  piperacillin-tazobactam (ZOSYN) IVPB 3.375 g  Status:  Discontinued        3.375 g 12.5 mL/hr over 240 Minutes Intravenous Every 8 hours 12/13/22 0833 12/20/22 1333       Assessment/Plan: s/p Procedure(s): REOPENING LAPAROTOMY, ILEOSTOMY;  ABDOMINAL CLOSURE (N/A) Hx Crohn's disease s/p subtotal colectomy  Internal hernia with bowel ischemia - POD 17 exploratory laparotomy with small bowel resection 9/5 Dr. Hillery Hunter - POD 115  s/p exploratory laparotomy, ileostomy, abdominal closure 9/7 Dr. 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 Highly likely to have high ostomy output  - Cont TPN. Will likely need TPN at d/c. Pre-alb 11 and Albumin < 1.5 on 9/19 -  Imodium 2mg  BID, Iron 325mg  BID, Fibercon 625mg  BID started 9/11 to slow ileostomy output down.  Increased loperamide from 2 mg to 4 twice daily 9/14.  Increase loperamide to 4mg  QID 9/19. Output downtrending. Continue to trend. Could consider adding Lomotil. May need intermittent fluid replacement for high ileostomy output.  - On trickle TF's. Would continue for now. Consider slow increase over the weekend.  - Diet per SLP. Currenlty on Nectar-thick liquid;Dysphagia 1 (puree) via tsp only - MBS today - WOCN following for new ostomy. Plan ostomy clinic referral at d/c - Midline wound with hx of bleeding leading. Resolved. Cont Vac. Plan vac change today.  - CT A/P 9/16 done. No indication for return to the OR. Large amount of  ascites. S/p IR paracentesis yesterday with 500cc of clear yellow fluid. Cx pending. No growth to date.  - ABx and anti-fungals per ID - PT/OT - Palliative following peripherally currently.  - keep TF at current rate due to output    - CT today due to rising WBC  ID - Zosyn 9/4>>9/11. 9/13 >> Micafungin 9/19 >> Patient still febrile with elevated leukocytosis.  LE [place] 9/18 neg. UE [place] 9/19 neg. UA neg. CXR w/ L > R opacities with b/l pleural effusions. Defer to [facility] if pleural effusions need further addressing. Resp Cx ordered. Bcx including specific fungal blood cx NGTD.    FEN - D1 per SLP as above. On trickle TF's. TPN VTE - SCDs, HEP GTT held for bleeding/ anemia.  Foley - Failed TOV. Foley in place with good UOP.      Stool a little thicker   Continue supportive care     --per [facility]- PE - Noted on CTA 9/10. LE [place] negative for DVT. Heparin gtt currently on hold for ABL anemia.  If hgb stable in AM hopefully can restart Heparin with no bolus. Agree with CCM that we may need to consider IVC temporary filter at some point if unable to restart heparin gtt (hx breast ca, recent surgery, immobilized). Shock - off pressors VDRF - extubated  Recent cardiac arrest 9/3 AKI - Cr stable on most recent labs Anemia - Transfuse to keep hemoglobin above 7. S/p 1U PRBC 9/12, 2U 9/14, 1U 9/16, 1U 9/17. Hgb 8.2 > 7.6 > 7.2. Could consider 1U PRBC today. Consider IV iron supplementation - she has short gut so worried she will not absorb iron well orally. Last PT-INR 9/15 was 1.3.  Hx metastatic breast cancer  LOS: 18 days    [name] 12/31/2022

## 2022-12-31 NOTE — Progress Notes (Addendum)
ANTICOAGULATION CONSULT NOTE   Pharmacy Consult for heparin  Indication: pulmonary embolus  Allergies  Allergen Reactions   Nsaids Other (See Comments)    Crohns disease/ IBD   Chlorhexidine Gluconate Itching    Able to use CHG swabs for ports, reaction with direct skin contact   Hydromorphone Other (See Comments)    Confusion/sedation   Lactose Intolerance (Gi) Other (See Comments)    Upset stomach/diarrhea    Patient Measurements: Height: 5\' 4"  (162.6 cm) Weight: 65.3 kg (143 lb 15.4 oz) IBW/kg (Calculated) : 54.7 Heparin Dosing Weight: 63.4 kg Vital Signs: Temp: 100.2 F (37.9 C) (09/22 0600) Temp Source: Bladder (09/22 0400) BP: 119/73 (09/22 0600) Pulse Rate: 118 (09/22 0600)  Labs: Recent Labs    12/29/22 0529 12/30/22 0514 12/30/22 1338 12/30/22 2128 12/31/22 0504  HGB 7.2* 7.0*  --   --  8.6*  HCT 22.6* 22.0*  --   --  26.8*  PLT 275 299  --   --  326  APTT  --   --  38*  --   --   LABPROT  --   --  16.0*  --   --   INR  --   --  1.3*  --   --   HEPARINUNFRC  --   --   --  <0.10* 0.12*  CREATININE  --  1.06*  --   --  0.72    Estimated Creatinine Clearance: 76.7 mL/min (by C-G formula based on SCr of 0.72 mg/dL).  Medications:  Medications Prior to Admission  Medication Sig Dispense Refill Last Dose   acetaminophen (TYLENOL) 500 MG tablet Take 500-1,000 mg by mouth every 6 (six) hours as needed for mild pain.   12/12/2022   cholecalciferol (VITAMIN D3) 25 MCG (1000 UNIT) tablet Take 1,000 Units by mouth daily.   12/12/2022   hyoscyamine (LEVSIN SL) 0.125 MG SL tablet Place 1 tablet (0.125 mg total) under the tongue every 6 (six) hours as needed for cramping. 30 tablet 0 unknown   Multiple Vitamins-Minerals (MULTIVITAMIN WITH MINERALS) tablet Take 1 tablet by mouth daily.   Past Week   nystatin (MYCOSTATIN) 100000 UNIT/ML suspension Take 5 mLs (500,000 Units total) by mouth 4 (four) times daily. 473 mL 0 Past Month   potassium chloride SA (KLOR-CON M) 20  MEQ tablet Take 1 tablet (20 mEq total) by mouth daily. 90 tablet 2 12/12/2022   [EXPIRED] predniSONE (DELTASONE) 20 MG tablet Take 2 tablets (40 mg total) by mouth daily with breakfast for 7 days, THEN 1.5 tablets (30 mg total) daily with breakfast for 7 days, THEN 1 tablet (20 mg total) daily with breakfast for 21 days. 45.5 tablet 0 12/12/2022   simethicone (MYLICON) 80 MG chewable tablet Chew 1 tablet (80 mg total) by mouth 4 (four) times daily as needed for flatulence. 30 tablet 0 unknown    Assessment: Patient was placed on heparin drip from 9/10-9/12 for new LLL PE. On 9/12 heparin drip was discontinued due to hemoglobin drop and blood in ostomy.    On 9/21:  Pharmacy has been consulted to resume heparin drip. - 9/21 Hgb= 7 >> received 1 unit PRBCs on 9/21.  Per Dr. Pola Corn there is no evidence of active bleeding at this time; per MD start heparin drip after transfusion on 9/21.    Today, 12/31/22 - at 0504 heparin level= 0.12, subtherapeutic, on heparin drip at 1250 units/hr - Hgb = 8.6 (post transfusion, (1 unit PRBCs) previous day 9/21) -  Plt = 326, WNL - no bleeding or interruptions per RN  Goal of Therapy:  Heparin level 0.3-0.7 units/ml Monitor platelets by anticoagulation protocol: Yes   Plan:  - Omit bolus per MD - increase heparin drip to 1450 units/hr - obtain heparin level in 6 hours  - Daily CBC & heparin level - monitor for signs/symptoms of bleeding.  ADDENDUM: - IR to advise & notify pharmacy / nursing when heparin drip needs to be stopped/held prior to upcoming paracentesis procedure.   Tacy Learn, PharmD Clinical Pharmacist 12/31/2022 7:21 AM

## 2022-12-31 NOTE — Plan of Care (Signed)
IR was requested for image guided drain placement for ascites.   Case was reviewed by Dr. Elby Showers. Recommends repeat diagnostic/therapeutic paracentesis.    General surgery and medicine team notified, general surgery agree with the plan. Diagnostic paracentesis ordered, will be performed tomorrow.  Will keep IR rad eval active, will follow ascites lab result.    Please call IR for questions and concerns.   Willette Brace PA-C 12/31/2022 2:39 PM

## 2022-12-31 NOTE — Progress Notes (Signed)
Subjective/Chief Complaint: Late entry from 12/30/2022    Pt without complaint      Objective: Vital signs in last 24 hours: Temp:  [98.6 F (37 C)-100.4 F (38 C)] 100.2 F (37.9 C) (09/22 0600) Pulse Rate:  [101-139] 118 (09/22 0600) Resp:  [16-25] 19 (09/22 0600) BP: (101-137)/(66-81) 119/73 (09/22 0600) SpO2:  [99 %-100 %] 100 % (09/22 0600) Weight:  [65.3 kg] 65.3 kg (09/22 0500) Last BM Date : 12/30/22  Intake/Output from previous day: 09/21 0701 - 09/22 0700 In: 3387.7 [I.V.:1813; Blood:403.3; NG/GT:475; IV Piggyback:696.4] Out: 4850 [Urine:3100; Drains:150; Stool:1600] Intake/Output this shift: No intake/output data recorded.  Vac in place ostomy functioning  output trending down     Lab Results:  Recent Labs    12/30/22 0514 12/31/22 0504  WBC 17.1* 18.7*  HGB 7.0* 8.6*  HCT 22.0* 26.8*  PLT 299 326   BMET Recent Labs    12/30/22 0514 12/31/22 0504  NA 136 134*  K 4.1 4.3  CL 102 101  CO2 25 25  GLUCOSE 129* 121*  BUN 40* 35*  CREATININE 1.06* 0.72  CALCIUM 7.8* 8.1*   PT/INR Recent Labs    12/30/22 1338  LABPROT 16.0*  INR 1.3*   ABG No results for input(s): "PHART", "HCO3" in the last 72 hours.  Invalid input(s): "PCO2", "PO2"  Studies/Results: No results found.  Anti-infectives: Anti-infectives (From admission, onward)    Start     Dose/Rate Route Frequency Ordered Stop   12/29/22 1215  ciprofloxacin (CIPRO) IVPB 400 mg        400 mg 200 mL/hr over 60 Minutes Intravenous 2 times daily 12/29/22 1123     12/29/22 1215  metroNIDAZOLE (FLAGYL) IVPB 500 mg        500 mg 100 mL/hr over 60 Minutes Intravenous 2 times daily 12/29/22 1123     12/28/22 1430  micafungin (MYCAMINE) 100 mg in sodium chloride 0.9 % 100 mL IVPB        100 mg 105 mL/hr over 1 Hours Intravenous Every 24 hours 12/28/22 1335     12/23/22 2000  vancomycin (VANCOREADY) IVPB 1250 mg/250 mL  Status:  Discontinued        1,250 mg 166.7 mL/hr over 90  Minutes Intravenous Every 36 hours 12/22/22 0612 12/22/22 1103   12/22/22 0645  vancomycin (VANCOREADY) IVPB 1250 mg/250 mL        1,250 mg 166.7 mL/hr over 90 Minutes Intravenous  Once 12/22/22 0551 12/22/22 0805   12/22/22 0600  piperacillin-tazobactam (ZOSYN) IVPB 3.375 g  Status:  Discontinued        3.375 g 12.5 mL/hr over 240 Minutes Intravenous Every 8 hours 12/22/22 0551 12/29/22 1123   12/13/22 1000  piperacillin-tazobactam (ZOSYN) IVPB 3.375 g  Status:  Discontinued        3.375 g 12.5 mL/hr over 240 Minutes Intravenous Every 8 hours 12/13/22 0833 12/20/22 1333       Assessment/Plan: s/p Procedure(s): REOPENING LAPAROTOMY, ILEOSTOMY;  ABDOMINAL CLOSURE (N/A) Hx Crohn's disease s/p subtotal colectomy  Internal hernia with bowel ischemia - POD 16 exploratory laparotomy with small bowel resection 9/5 Dr. Hillery Hunter - POD 14   s/p exploratory laparotomy, ileostomy, abdominal closure 9/7 Dr. 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 Highly likely to have high ostomy output  - Cont TPN. Will likely need TPN at d/c. Pre-alb 11 and Albumin < 1.5 on 9/19 - Imodium 2mg  BID, Iron 325mg  BID,  Fibercon 625mg  BID started 9/11 to slow ileostomy output down.  Increased loperamide from 2 mg to 4 twice daily 9/14.  Increase loperamide to 4mg  QID 9/19. Output downtrending. Continue to trend. Could consider adding Lomotil. May need intermittent fluid replacement for high ileostomy output.  - On trickle TF's. Would continue for now. Consider slow increase over the weekend.  - Diet per SLP. Currenlty on Nectar-thick liquid;Dysphagia 1 (puree) via tsp only - MBS today - WOCN following for new ostomy. Plan ostomy clinic referral at d/c - Midline wound with hx of bleeding leading. Resolved. Cont Vac. Plan vac change today.  - CT A/P 9/16 done. No indication for return to the OR. Large amount of ascites. S/p IR paracentesis yesterday with 500cc of  clear yellow fluid. Cx pending. No growth to date.  - ABx and anti-fungals per ID - PT/OT - Palliative following peripherally currently.    ID - Zosyn 9/4>>9/11. 9/13 >> Micafungin 9/19 >> Patient still febrile with elevated leukocytosis.  LE [place] 9/18 neg. UE [place] 9/19 neg. UA neg. CXR w/ L > R opacities with b/l pleural effusions. Defer to [facility] if pleural effusions need further addressing. Resp Cx ordered. Bcx including specific fungal blood cx NGTD.    FEN - D1 per SLP as above. On trickle TF's. TPN VTE - SCDs, HEP GTT held for bleeding/ anemia.  Foley - Failed TOV. Foley in place with good UOP.                --per [facility]- PE - Noted on CTA 9/10. LE [place] negative for DVT. Heparin gtt currently on hold for ABL anemia.  If hgb stable in AM hopefully can restart Heparin with no bolus. Agree with CCM that we may need to consider IVC temporary filter at some point if unable to restart heparin gtt (hx breast ca, recent surgery, immobilized). Shock - off pressors VDRF - extubated  Recent cardiac arrest 9/3 AKI - Cr stable on most recent labs Anemia - Transfuse to keep hemoglobin above 7. S/p 1U PRBC 9/12, 2U 9/14, 1U 9/16, 1U 9/17. Hgb 8.2 > 7.6 > 7.2. Could consider 1U PRBC today. Consider IV iron supplementation - she has short gut so worried she will not absorb iron well orally. Last PT-INR 9/15 was 1.3.  Hx metastatic breast cancer  LOS: 18 days     Harriette Bouillon MD   12/30/2022

## 2022-12-31 NOTE — Progress Notes (Signed)
PHARMACY - TOTAL PARENTERAL NUTRITION CONSULT NOTE   Indication: Short bowel syndrome  Patient Measurements: Height: 5\' 4"  (162.6 cm) Weight: 65.3 kg (143 lb 15.4 oz) IBW/kg (Calculated) : 54.7   Body mass index is 24.71 kg/m. Usual Weight: 46.3 kg from 8/28  Assessment: 47 y.o. female with PMH of recurrent breast cancer not currently on treatment, Crohn's disease s/p subtotal colectomy in 2021, recurrent obstructions, colonic stricture, recent hospitalization 8/10-8/15/2024 for obstruction who presented on 12/12/2022 with abdominal pain, nausea, vomiting diarrhea.    9/4 Admit; found down in ED bathroom with no pulse, CPR > intubated 9/5 s/p ex-lap, found to have internal hernia with small bowel ischemia, only 105 cm of small bowel remaining OR 9/7 for closure, ostomy creation 9/8 pharmacy consulted to manage TPN 9/15 Surgery requesting transition to cyclic TPN  Glucose / Insulin: no Hx DM - steroids PTA for Crohn's disease; weaned off as of 9/13 - CBGs goal < 180. 9/ 22 AM Glucose= 121. Range past 24hrs: 80-167; 3 unit SSI given in past 24hrs - On SSI (sensitive scale) Electrolytes: Na slighlty low, all electrolytes WNL Note: CorrCa=10.2 (trending upward); K=4.3 (trending upward) Renal: Baseline SCr <1. AKI s/p arrest, Scr has improved; BUN remains elevated but trending downward Hepatic:  no LFTs drawn 9/22.   9/21:  Alk phos mildly elevated at 149.  AST/ATL/T.bili WNL.  9/21: Albumin remains low.  - TG (9/11) normalized after stopping propofol - TG (9/21)= 119 I/O:  - 9/21 7am - 9/22 7am:   UOP: 3100 mL Ileostomy: 1600 mL (output down-trending) Drains: - getting fiber, iron, loperamide per tube GI Imaging:  - CTA 9/10 shows new PE, but also abdominal ascites - CT Abd: Large volume ascites GI Surgeries / Procedures:  - OR 9/5 ex lap, internal hernia w/ small bowel ischemia, SBR & celiotomy; > 105 cm small bowel remaining; - OR 9/7 for closure, ostomy  creation -9/17: Paracentesis  Central access: triple lumen CVC R subclavian 9/4, triple lumen CVC R femoral 9/4;  PAC 05/02/22 TPN start date: 12/17/22  Nutritional Goals: TPN provides 94 g protein and 1680 kcal per day  RD Assessment: Estimated Needs Total Energy Estimated Needs: 1600-1800 Total Protein Estimated Needs: 85-95 grams Total Fluid Estimated Needs: 1.6L/day  Current Nutrition:  TPN  9/18: Trickle TF > per RD on 9/20 plan to continue trickle TF overnight 9/20 Molli Posey Supplement po bid 9/20: MBS -> diet advanced to DYS 1 with nectar thick liquids   Spoke with Dietician on 9/20 -- over weekend will plan to keep TPN at goal rate; for now will not count supplements in total calorie count.  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  CCS MD requesting repletion of vitamins ADEK.  -Vitamin A level pending prior to supplementation > Dietician to follow -Vitamin K IV weekly, Vitamin D and E per tube  Plan: Continue TPN cycle on 12 hr cycle at 18:00 tonight Anticipate prolonged/indefinite TPN dependency Max glucose infusion rate 5.47 mg/kg/min Electrolytes in TPN: No change Na - 100 mEq/L K - 40 mEq/L Ca - 0 mEq/L Mg - 10 mEq/L Phos - 15 mmol/L Cl:Ac ratio - 1:2 Increase Sodium, due to slightly low level Reduce Potassium, ostomy output down-trending. Remove calcium due to level trending upward, will order ionized calcium Add standard MVI and trace elements to TPN Chromium on hold d/t critical shortage Continue CBG checks 4x daily while cycling TPN On TPN Off TPN 1-2 hr after  completing ramp-up 1 hr after TPN stopped Monitor TPN labs on Mon/Thurs and as needed Ionized Calcium level tomorrow AM  Tacy Learn, PharmD Clinical Pharmacist 12/31/2022 3:15 PM

## 2022-12-31 NOTE — TOC Progression Note (Signed)
Transition of Care [facility]) - Progression Note    Patient Details  Name: [name] MRN: [id number] Date of Birth: November 02, 1975  Transition of Care [facility]) CM/SW Contact  Darleene Cleaver, [place] Phone Number: 12/31/2022, 4:02 PM  Clinical Narrative:      PT and OT recommending CIR, TOC continuing to follow patient's progress throughout discharge planning.  TOC to follow in case patient not eligible for CIR, then TOC will have to work patient up for SNF placement for rehab.   Expected Discharge Plan: Home/Self Care (unsure at this time) Barriers to Discharge: Continued Medical Work up  Expected Discharge Plan and Services In-house Referral: Hospice / Palliative Care Discharge Planning Services: CM Consult Post Acute Care Choice: NA Living arrangements for the past 2 months: Single Family Home                 DME Arranged: N/A DME Agency: NA       HH Arranged: NA HH Agency: NA         Social Determinants of Health (SDOH) Interventions SDOH Screenings   Food Insecurity: No Food Insecurity (11/21/2022)  Housing: Patient Unable To Answer (11/21/2022)  Transportation Needs: No Transportation Needs (11/21/2022)  Utilities: Not At Risk (11/21/2022)  Tobacco Use: Medium Risk (12/14/2022)    Readmission Risk Interventions    12/20/2022    3:50 PM 12/15/2022    2:43 PM  Readmission Risk Prevention Plan  Transportation Screening Complete Complete  PCP or Specialist Appt within 3-5 Days  Complete  HRI or Home Care Consult  Complete  Social Work Consult for Recovery Care Planning/Counseling  Complete  Palliative Care Screening  Complete  Medication Review Oceanographer) Complete Complete  PCP or Specialist appointment within 3-5 days of discharge Complete   HRI or Home Care Consult Complete   SW Recovery Care/Counseling Consult Complete   Palliative Care Screening Complete   Skilled Nursing Facility Not Applicable

## 2023-01-01 ENCOUNTER — Ambulatory Visit (HOSPITAL_COMMUNITY): Payer: Managed Care, Other (non HMO)

## 2023-01-01 ENCOUNTER — Inpatient Hospital Stay (HOSPITAL_COMMUNITY): Payer: Managed Care, Other (non HMO)

## 2023-01-01 DIAGNOSIS — K90829 Short bowel syndrome, unspecified: Secondary | ICD-10-CM | POA: Diagnosis not present

## 2023-01-01 LAB — BODY FLUID CELL COUNT WITH DIFFERENTIAL
Eos, Fluid: 0 %
Lymphs, Fluid: 1 %
Monocyte-Macrophage-Serous Fluid: 2 % — ABNORMAL LOW (ref 50–90)
Neutrophil Count, Fluid: 97 % — ABNORMAL HIGH (ref 0–25)
Total Nucleated Cell Count, Fluid: 8598 cu mm — ABNORMAL HIGH (ref 0–1000)

## 2023-01-01 LAB — MAGNESIUM: Magnesium: 1.9 mg/dL (ref 1.7–2.4)

## 2023-01-01 LAB — CBC
HCT: 24 % — ABNORMAL LOW (ref 36.0–46.0)
HCT: 24.2 % — ABNORMAL LOW (ref 36.0–46.0)
Hemoglobin: 7.5 g/dL — ABNORMAL LOW (ref 12.0–15.0)
Hemoglobin: 7.8 g/dL — ABNORMAL LOW (ref 12.0–15.0)
MCH: 28.2 pg (ref 26.0–34.0)
MCH: 28.5 pg (ref 26.0–34.0)
MCHC: 31.3 g/dL (ref 30.0–36.0)
MCHC: 32.2 g/dL (ref 30.0–36.0)
MCV: 90.2 fL (ref 80.0–100.0)
MCV: 88.3 fL (ref 80.0–100.0)
Platelets: 317 10*3/uL (ref 150–400)
Platelets: 314 10*3/uL (ref 150–400)
RBC: 2.66 MIL/uL — ABNORMAL LOW (ref 3.87–5.11)
RBC: 2.74 MIL/uL — ABNORMAL LOW (ref 3.87–5.11)
RDW: 19 % — ABNORMAL HIGH (ref 11.5–15.5)
RDW: 19.1 % — ABNORMAL HIGH (ref 11.5–15.5)
WBC: 17.6 10*3/uL — ABNORMAL HIGH (ref 4.0–10.5)
WBC: 18.1 10*3/uL — ABNORMAL HIGH (ref 4.0–10.5)
nRBC: 0 % (ref 0.0–0.2)
nRBC: 0 % (ref 0.0–0.2)

## 2023-01-01 LAB — COMPREHENSIVE METABOLIC PANEL
ALT: 26 U/L (ref 0–44)
AST: 32 U/L (ref 15–41)
Albumin: <1.5 g/dL — ABNORMAL LOW (ref 3.5–5.0)
Alkaline Phosphatase: 148 U/L — ABNORMAL HIGH (ref 38–126)
Anion gap: 5 (ref 5–15)
BUN: 34 mg/dL — ABNORMAL HIGH (ref 6–20)
CO2: 27 mmol/L (ref 22–32)
Calcium: 7.2 mg/dL — ABNORMAL LOW (ref 8.9–10.3)
Chloride: 101 mmol/L (ref 98–111)
Creatinine, Ser: 0.94 mg/dL (ref 0.44–1.00)
GFR, Estimated: >60 mL/min (ref >60)
Glucose, Bld: 121 mg/dL — ABNORMAL HIGH (ref 70–99)
Potassium: 3.8 mmol/L (ref 3.5–5.1)
Sodium: 133 mmol/L — ABNORMAL LOW (ref 135–145)
Total Bilirubin: 0.4 mg/dL (ref 0.3–1.2)
Total Protein: 5.6 g/dL — ABNORMAL LOW (ref 6.5–8.1)

## 2023-01-01 LAB — BASIC METABOLIC PANEL
Anion gap: 8 (ref 5–15)
BUN: 33 mg/dL — ABNORMAL HIGH (ref 6–20)
CO2: 25 mmol/L (ref 22–32)
Calcium: 7.2 mg/dL — ABNORMAL LOW (ref 8.9–10.3)
Chloride: 102 mmol/L (ref 98–111)
Creatinine, Ser: 1.01 mg/dL — ABNORMAL HIGH (ref 0.44–1.00)
GFR, Estimated: >60 mL/min (ref >60)
Glucose, Bld: 149 mg/dL — ABNORMAL HIGH (ref 70–99)
Potassium: 3.8 mmol/L (ref 3.5–5.1)
Sodium: 135 mmol/L (ref 135–145)

## 2023-01-01 LAB — CULTURE, BLOOD (ROUTINE X 2)
Culture: NO GROWTH
Culture: NO GROWTH
Special Requests: ADEQUATE
Special Requests: ADEQUATE

## 2023-01-01 LAB — GLUCOSE, CAPILLARY
Glucose-Capillary: 108 mg/dL — ABNORMAL HIGH (ref 70–99)
Glucose-Capillary: 132 mg/dL — ABNORMAL HIGH (ref 70–99)
Glucose-Capillary: 132 mg/dL — ABNORMAL HIGH (ref 70–99)
Glucose-Capillary: 89 mg/dL (ref 70–99)

## 2023-01-01 LAB — TYPE AND SCREEN
ABO/RH(D): B POS
Antibody Screen: NEGATIVE
Unit division: 0

## 2023-01-01 LAB — BPAM RBC
Blood Product Expiration Date: 202410192359
ISSUE DATE / TIME: 202409211045
Unit Type and Rh: 7300

## 2023-01-01 LAB — HEPARIN LEVEL (UNFRACTIONATED): Heparin Unfractionated: 0.35 IU/mL (ref 0.30–0.70)

## 2023-01-01 LAB — PHOSPHORUS: Phosphorus: 3.1 mg/dL (ref 2.5–4.6)

## 2023-01-01 LAB — TRIGLYCERIDES: Triglycerides: 69 mg/dL (ref <150)

## 2023-01-01 MED ORDER — LORAZEPAM 0.5 MG PO TABS
0.5000 mg | ORAL_TABLET | Freq: Four times a day (QID) | ORAL | Status: DC | PRN
  Administered 2023-01-02: 0.5 mg
  Filled 2023-01-01: qty 1

## 2023-01-01 MED ORDER — TRAVASOL 10 % IV SOLN
INTRAVENOUS | Status: AC
  Filled 2023-01-01: qty 940.8

## 2023-01-01 MED ORDER — NUTRISOURCE FIBER PO PACK
1.0000 | PACK | Freq: Three times a day (TID) | ORAL | Status: DC
  Administered 2023-01-01 – 2023-01-11 (×31): 1
  Filled 2023-01-01 (×33): qty 1

## 2023-01-01 MED ORDER — ORAL CARE MOUTH RINSE: 15.0000 mL | OROMUCOSAL | Status: DC | PRN

## 2023-01-01 MED ORDER — DIPHENOXYLATE-ATROPINE 2.5-0.025 MG PO TABS
1.0000 | ORAL_TABLET | Freq: Two times a day (BID) | ORAL | Status: DC
  Administered 2023-01-01 – 2023-01-04 (×7): 1
  Filled 2023-01-01 (×7): qty 1

## 2023-01-01 MED ORDER — ORAL CARE MOUTH RINSE
15.0000 mL | OROMUCOSAL | Status: DC
  Administered 2023-01-01 – 2023-01-02 (×2): 15 mL via OROMUCOSAL

## 2023-01-01 MED ORDER — LIDOCAINE HCL 1 % IJ SOLN
INTRAMUSCULAR | Status: AC
  Filled 2023-01-01: qty 20

## 2023-01-01 MED ORDER — ORAL CARE MOUTH RINSE
15.0000 mL | OROMUCOSAL | Status: DC
  Administered 2023-01-01 – 2023-01-16 (×48): 15 mL via OROMUCOSAL

## 2023-01-01 MED ORDER — KATE FARMS STANDARD 1.4 PO LIQD
325.0000 mL | Freq: Two times a day (BID) | ORAL | Status: DC
  Administered 2023-01-01 – 2023-01-02 (×2): 325 mL
  Filled 2023-01-01 (×3): qty 325

## 2023-01-01 NOTE — Progress Notes (Signed)
PT Cancellation Note  Patient Details Name: [name] MRN: [id number] DOB: [birth year]   Cancelled Treatment:     Pt was out of room for a procedure then requested "later" however due to schedule was unable to return to unit.  Will attempt to see another day   Armando Reichert 01/01/2023, 4:30 PM

## 2023-01-01 NOTE — Progress Notes (Signed)
ANTICOAGULATION CONSULT NOTE   Pharmacy Consult for heparin  Indication: pulmonary embolus  Allergies  Allergen Reactions   Nsaids Other (See Comments)    Crohns disease/ IBD   Chlorhexidine Gluconate Itching    Able to use CHG swabs for ports, reaction with direct skin contact   Hydromorphone Other (See Comments)    Confusion/sedation   Lactose Intolerance (Gi) Other (See Comments)    Upset stomach/diarrhea    Patient Measurements: Height: 5\' 4"  (162.6 cm) Weight: 65.3 kg (143 lb 15.4 oz) IBW/kg (Calculated) : 54.7 Heparin Dosing Weight: 63.4 kg Vital Signs: Temp: 100 F (37.8 C) (09/22 2200) Temp Source: Bladder (09/22 2000) BP: 141/85 (09/22 2200) Pulse Rate: 121 (09/22 2200)  Labs: Recent Labs    12/29/22 0529 12/30/22 0514 12/30/22 1338 12/30/22 2128 12/31/22 0504 12/31/22 1529 12/31/22 2335  HGB 7.2* 7.0*  --   --  8.6*  --   --   HCT 22.6* 22.0*  --   --  26.8*  --   --   PLT 275 299  --   --  326  --   --   APTT  --   --  38*  --   --   --   --   LABPROT  --   --  16.0*  --   --   --   --   INR  --   --  1.3*  --   --   --   --   HEPARINUNFRC  --   --   --    < > 0.12* 0.18* 0.42  CREATININE  --  1.06*  --   --  0.72  --   --    < > = values in this interval not displayed.    Estimated Creatinine Clearance: 76.7 mL/min (by C-G formula based on SCr of 0.72 mg/dL).  Medications:  Medications Prior to Admission  Medication Sig Dispense Refill Last Dose   acetaminophen (TYLENOL) 500 MG tablet Take 500-1,000 mg by mouth every 6 (six) hours as needed for mild pain.   12/12/2022   cholecalciferol (VITAMIN D3) 25 MCG (1000 UNIT) tablet Take 1,000 Units by mouth daily.   12/12/2022   hyoscyamine (LEVSIN SL) 0.125 MG SL tablet Place 1 tablet (0.125 mg total) under the tongue every 6 (six) hours as needed for cramping. 30 tablet 0 unknown   Multiple Vitamins-Minerals (MULTIVITAMIN WITH MINERALS) tablet Take 1 tablet by mouth daily.   Past Week   nystatin  (MYCOSTATIN) 100000 UNIT/ML suspension Take 5 mLs (500,000 Units total) by mouth 4 (four) times daily. 473 mL 0 Past Month   potassium chloride SA (KLOR-CON M) 20 MEQ tablet Take 1 tablet (20 mEq total) by mouth daily. 90 tablet 2 12/12/2022   [EXPIRED] predniSONE (DELTASONE) 20 MG tablet Take 2 tablets (40 mg total) by mouth daily with breakfast for 7 days, THEN 1.5 tablets (30 mg total) daily with breakfast for 7 days, THEN 1 tablet (20 mg total) daily with breakfast for 21 days. 45.5 tablet 0 12/12/2022   simethicone (MYLICON) 80 MG chewable tablet Chew 1 tablet (80 mg total) by mouth 4 (four) times daily as needed for flatulence. 30 tablet 0 unknown    Assessment: Patient was placed on heparin drip from 9/10-9/12 for new LLL PE. On 9/12 heparin drip was discontinued due to hemoglobin drop and blood in ostomy.    On 9/21: Pharmacy has been consulted to resume heparin drip. - 9/21 Hgb= 7 >>  received 1 unit PRBCs on 9/21. Start heparin drip after transfusion on 9/21.    Today, 01/01/23 -Heparin level 0.18, remains subtherapeutic with heparin infusion now at 1450 units/hr -Hgb low, but improved post transfusion; plt stable -No complications of therapy noted  2335 HL 0.42 therapeutic on 1700 units/hr  Goal of Therapy:  Heparin level 0.3-0.7 units/ml Monitor platelets by anticoagulation protocol: Yes   Plan:  -Omit bolus per MD -continue  heparin drip at 1700 units/hr -Check heparin level in 6 hours  -Daily CBC & heparin level -Monitor for signs/symptoms of bleeding.  Note plan for paracentesis with IR for 9/23. IR to comment/hold heparin as necessary pre-procedure.   Arley Phenix RPh 01/01/2023, 12:14 AM

## 2023-01-01 NOTE — Consult Note (Addendum)
WOC Nurse wound follow up; this visit made with Adin Hector, PA CCS  Wound type:  full thickness surgical  Measurement:17 cm x 4.5 cm x 1 cm  Wound bed: 80% pink moist clean, 20% tan fibrinous material at distal portion of wound  Drainage (amount, consistency, odor) minimal serosanguinous  Periwound: intact, ileostomy to left of wound  Dressing procedure/placement/frequency: Removed old NPWT dressing Cleansed wound with normal saline Filled wound with 1 piece white foam, 1 piece black foam   Sealed NPWT dressing at HG Patient received IV and PO pain medication per bedside nurse prior to dressing change Patient tolerated procedure well; much less painful at today's visit than when last changed  WOC nurse will continue to provide NPWT dressing changes due to the complexity of the dressing change. Next scheduled change Wednesday 01/03/2024 with CCS PA.    WOC Nurse ostomy follow up; unable to change NPWT without also changing pouching at this time  Stoma type/location: LLQ ileostomy  Stomal assessment/size: 1 1/4" well budded, pink moist, round  Peristomal assessment: intact; midline incision to R of wound as above  Treatment options for stomal/peristomal skin:  skin prep applied to surrounding skin, 2" barrier ring  Output 400 mls yellow liquid effluent  Ostomy pouching: 2 piece 2 1/4" skin barrier [employer] [place] #[phone], 2 1/4" high output pouch [place] [phone] and 2" barrier ring [place] [phone]  Education provided: patient verbalizes that ongoing pouching system will need to be changed twice weekly but we are having to change with every NPWT at this time. Also discussed that as output thickens and lessens we will switch from high output pouch to regular pouch with lock and roll mechanism.  Adin Hector PA discussed high output ileostomy with patient and measures that are underway to help reduce and thicken output.  Patient verbalized cleaning around stoma with water moistened washcloth only.  We  measured stoma at 1 1/4" round today, patient was able to cut her own skin barrier. We placed 2" barrier ring around stoma and skin barrier on top.  I snapped new high output pouch on and hooked to bedside drainage bag.    Enrolled patient in [place] Secure Start Discharge program: Yes  Ordered supplies for room including (2) large black foam kits [place] [phone]), 1 white foam kit [place] [phone]) and (4) high output pouches [place] [phone]).  WOC team will continue to follow for ostomy teaching and support.   Thank you,    Priscella Mann MSN, RN-BC, [employer] [phone]

## 2023-01-01 NOTE — Plan of Care (Signed)
  Problem: Education: Goal: Ability to describe self-care measures that may prevent or decrease complications (Diabetes Survival Skills Education) will improve Outcome: Progressing Goal: Individualized Educational Video(s) Outcome: Progressing   Problem: Coping: Goal: Ability to adjust to condition or change in health will improve Outcome: Progressing   Problem: Fluid Volume: Goal: Ability to maintain a balanced intake and output will improve Outcome: Progressing   Problem: Health Behavior/Discharge Planning: Goal: Ability to identify and utilize available resources and services will improve Outcome: Progressing Goal: Ability to manage health-related needs will improve Outcome: Progressing   Problem: Metabolic: Goal: Ability to maintain appropriate glucose levels will improve Outcome: Progressing   Problem: Tissue Perfusion: Goal: Adequacy of tissue perfusion will improve Outcome: Progressing   Problem: Education: Goal: Knowledge of General Education information will improve Description: Including pain rating scale, medication(s)/side effects and non-pharmacologic comfort measures Outcome: Progressing   Problem: Health Behavior/Discharge Planning: Goal: Ability to manage health-related needs will improve Outcome: Progressing   Problem: Clinical Measurements: Goal: Will remain free from infection Outcome: Progressing Goal: Respiratory complications will improve Outcome: Progressing Goal: Cardiovascular complication will be avoided Outcome: Progressing   Problem: Activity: Goal: Risk for activity intolerance will decrease Outcome: Progressing   Problem: Elimination: Goal: Will not experience complications related to bowel motility Outcome: Progressing Goal: Will not experience complications related to urinary retention Outcome: Progressing   Problem: Pain Managment: Goal: General experience of comfort will improve Outcome: Progressing   Problem: Safety: Goal:  Ability to remain free from injury will improve Outcome: Progressing   Problem: Skin Integrity: Goal: Risk for impaired skin integrity will decrease Outcome: Progressing   Problem: Nutritional: Goal: Maintenance of adequate nutrition will improve Outcome: Not Progressing Goal: Progress toward achieving an optimal weight will improve Outcome: Not Progressing   Problem: Skin Integrity: Goal: Risk for impaired skin integrity will decrease Outcome: Not Progressing   Problem: Clinical Measurements: Goal: Ability to maintain clinical measurements within normal limits will improve Outcome: Not Progressing Goal: Diagnostic test results will improve Outcome: Not Progressing   Problem: Nutrition: Goal: Adequate nutrition will be maintained Outcome: Not Progressing   Problem: Coping: Goal: Level of anxiety will decrease Outcome: Not Progressing

## 2023-01-01 NOTE — Progress Notes (Signed)
Progress Note  16 Days Post-Op  Subjective: Seen with WOC RN for VAC change, patient tolerated well. She is eating some but does not like the D1 diet. Tolerating trickle TF. Ostomy output remains high.   Objective: Vital signs in last 24 hours: Temp:  [99 F (37.2 C)-101.1 F (38.4 C)] 99.9 F (37.7 C) (09/23 1100) Pulse Rate:  [105-132] 105 (09/23 1100) Resp:  [16-26] 16 (09/23 1100) BP: (91-141)/(53-88) 91/62 (09/23 1100) SpO2:  [98 %-100 %] 100 % (09/23 1100) Weight:  [56.5 kg] 56.5 kg (09/23 0500) Last BM Date : 12/31/22  Intake/Output from previous day: 09/22 0701 - 09/23 0700 In: 2855.7 [I.V.:1942.6; NG/GT:210; IV Piggyback:703.1] Out: 5000 [Urine:3125; Stool:1875] Intake/Output this shift: Total I/O In: 150 [NG/GT:150] Out: -   PE: Gen:  Alert, pleasant Card:  Tachycardic Pulm: Rate and effort normal Abd: Soft, mild distension, NT today. No rigidity or guarding on exam. Otherwise NT.  Ostomy pink, budded and viable with thin green liquid in ostomy bag. Midline wound as pictured below   GU: Foley draining straw colored urine.    Lab Results:  Recent Labs    12/31/22 0504 01/01/23 0456  WBC 18.7* 17.6*  HGB 8.6* 7.5*  HCT 26.8* 24.0*  PLT 326 317   BMET Recent Labs    01/01/23 0456 01/01/23 0617  NA 135 133*  K 3.8 3.8  CL 102 101  CO2 25 27  GLUCOSE 149* 121*  BUN 33* 34*  CREATININE 1.01* 0.94  CALCIUM 7.2* 7.2*   PT/INR Recent Labs    12/30/22 1338  LABPROT 16.0*  INR 1.3*   CMP     Component Value Date/Time   NA 133 (L) 01/01/2023 0617   NA 135 01/21/2020 1056   K 3.8 01/01/2023 0617   CL 101 01/01/2023 0617   CO2 27 01/01/2023 0617   GLUCOSE 121 (H) 01/01/2023 0617   BUN 34 (H) 01/01/2023 0617   BUN 12 01/21/2020 1056   CREATININE 0.94 01/01/2023 0617   CREATININE 0.91 09/21/2022 1141   CALCIUM 7.2 (L) 01/01/2023 0617   PROT 5.6 (L) 01/01/2023 0617   ALBUMIN <1.5 (L) 01/01/2023 0617   AST 32 01/01/2023 0617   AST 18  09/21/2022 1141   ALT 26 01/01/2023 0617   ALT 12 09/21/2022 1141   ALKPHOS 148 (H) 01/01/2023 0617   BILITOT 0.4 01/01/2023 0617   BILITOT 0.4 09/21/2022 1141   GFRNONAA >60 01/01/2023 0617   GFRNONAA >60 09/21/2022 1141   GFRAA 108 01/21/2020 1056   Lipase     Component Value Date/Time   LIPASE 762 (H) 12/21/2022 0305       Studies/Results: CT ABDOMEN PELVIS W CONTRAST  Result Date: 12/31/2022 CLINICAL DATA:  Postoperative abdominal pain. EXAM: CT ABDOMEN AND PELVIS WITH CONTRAST TECHNIQUE: Multidetector CT imaging of the abdomen and pelvis was performed using the standard protocol following bolus administration of intravenous contrast. RADIATION DOSE REDUCTION: This exam was performed according to the departmental dose-optimization program which includes automated exposure control, adjustment of the mA and/or kV according to patient size and/or use of iterative reconstruction technique. CONTRAST:  OMNIPAQUE IOHEXOL 300 MG/ML  SOLN COMPARISON:  CT of the abdomen and pelvis without contrast 12/25/2022 and 12/13/2022. FINDINGS: Lower chest: Moderate right-sided pleural effusion is again seen. Left pleural effusion is improved. Focal airspace consolidation is present at the left base. The airways are patent. Heart size is normal. No significant pericardial effusion is present. Left breast implant noted. Hepatobiliary: No  focal liver abnormality is seen. No gallstones, gallbladder wall thickening, or biliary dilatation. Pancreas: Mild dilation of the pancreatic duct noted. No obstructing lesion is present. Spleen: Normal in size without focal abnormality. Adrenals/Urinary Tract: The adrenal glands are normal bilaterally. Kidneys are unremarkable. No stone or mass lesion is present. No obstruction is present. A Foley catheter is present within the urinary bladder. Stomach/Bowel: The small bore feeding tube is in place. The tip is in the distal duodenum. Dilated loops of proximal small bowel  measure up to 3.7 cm. Just distal to the dilated segment marked small bowel wall thickening is present which continues to the level of the left lower quadrant ileostomy. Extensive small bowel resection and partial colectomy noted. Vascular/Lymphatic: The aorta is unremarkable. Subcentimeter periaortic lymph nodes are similar the prior exam, likely reactive. Are. Reproductive: Fibroid uterus is again noted.  The adnexa Other: Diffuse peritoneal thickening is now present. Locules of gas are present in the right lower quadrant. A small amount of free air remains. Musculoskeletal: The vertebral body heights and alignment are normal. No focal osseous abnormality is present. IMPRESSION: 1. Dilated loops of proximal small bowel measure up to 3.7 cm. Just distal to the dilated segment marked small bowel wall thickening is marked small bowel wall thickening which continues to the level of the left lower quadrant ileostomy. This raises concern for early developing obstruction. Findings are also concerning for bowel ischemia in this patient previous ischemic bowel. 2. Diffuse peritoneal thickening is concerning for peritonitis. 3. Persistent pneumoperitoneum is unexpected 15 days after surgery, raising concern infection. 4. Moderate right-sided pleural effusion is again seen. Left pleural effusion is improved. 5. Focal airspace consolidation at the left base is concerning for pneumonia. 6. Fibroid uterus. These results were called by telephone at the time of interpretation on 12/31/2022 at 11:04 am to provider [facility] , who verbally acknowledged these results. Electronically Signed   By: Marin Roberts M.D.   On: 12/31/2022 11:19    Anti-infectives: Anti-infectives (From admission, onward)    Start     Dose/Rate Route Frequency Ordered Stop   12/29/22 1215  ciprofloxacin (CIPRO) IVPB 400 mg        400 mg 200 mL/hr over 60 Minutes Intravenous 2 times daily 12/29/22 1123     12/29/22 1215  metroNIDAZOLE  (FLAGYL) IVPB 500 mg        500 mg 100 mL/hr over 60 Minutes Intravenous 2 times daily 12/29/22 1123     12/28/22 1430  micafungin (MYCAMINE) 100 mg in sodium chloride 0.9 % 100 mL IVPB        100 mg 105 mL/hr over 1 Hours Intravenous Every 24 hours 12/28/22 1335     12/23/22 2000  vancomycin (VANCOREADY) IVPB 1250 mg/250 mL  Status:  Discontinued        1,250 mg 166.7 mL/hr over 90 Minutes Intravenous Every 36 hours 12/22/22 0612 12/22/22 1103   12/22/22 0645  vancomycin (VANCOREADY) IVPB 1250 mg/250 mL        1,250 mg 166.7 mL/hr over 90 Minutes Intravenous  Once 12/22/22 0551 12/22/22 0805   12/22/22 0600  piperacillin-tazobactam (ZOSYN) IVPB 3.375 g  Status:  Discontinued        3.375 g 12.5 mL/hr over 240 Minutes Intravenous Every 8 hours 12/22/22 0551 12/29/22 1123   12/13/22 1000  piperacillin-tazobactam (ZOSYN) IVPB 3.375 g  Status:  Discontinued        3.375 g 12.5 mL/hr over 240 Minutes Intravenous Every 8 hours  12/13/22 1610 12/20/22 1333        Assessment/Plan  Hx Crohn's disease s/p subtotal colectomy  Internal hernia with bowel ischemia - POD 18 exploratory laparotomy with small bowel resection 9/5 Dr. Hillery Hunter - POD 16  s/p exploratory laparotomy, ileostomy, abdominal closure 9/7 Dr. 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 Highly likely to have high ostomy output  - Cont TPN. Will likely need TPN at d/c. Pre-alb 11 and Albumin < 1.5 on 9/19 - Imodium 4mg  QID, Iron 325mg  BID, Fibercon 625mg   increased to TID. Added lomotil today as well. - On trickle TF's. Would continue for now. Ok to increase as tolerated  - Diet per SLP. Currenlty on Nectar-thick liquid;Dysphagia 1 (puree) - WOCN following for new ostomy. Plan ostomy clinic referral at d/c - Midline wound with hx of bleeding leading. Resolved. Cont VAC M/W/F with white foam in base inferiorly  - CT A/P 9/16 done. No indication for return to the OR.  Large amount of ascites. S/p IR paracentesis 9/17 with 500cc of clear yellow fluid. Cx pending. No growth to date. IR planning repeat para today  - ABx and anti-fungals per ID - PT/OT - Palliative following peripherally currently.   ID - Zosyn 9/4>>9/11. 9/13 >> Micafungin 9/19 >> Patient still febrile with elevated leukocytosis.  LE [place] 9/18 neg. UE [place] 9/19 neg. UA neg. CXR w/ L > R opacities with b/l pleural effusions. Defer to [facility] if pleural effusions need further addressing. Resp Cx ordered. Bcx including specific fungal blood cx NGTD.    FEN - D1 per SLP as above. On trickle TF's. TPN VTE - SCDs, Hep gtt Foley - Failed TOV. Foley in place with good UOP.     - per [facility] - PE - Noted on CTA 9/10. LE [place] negative for DVT. Heparin gtt. Hgb 7.5 from 8.6, agree with PM CBC. Agree with CCM that we may need to consider IVC temporary filter at some point if unable to restart heparin gtt (hx breast ca, recent surgery, immobilized). Shock - off pressors VDRF - extubated  Recent cardiac arrest 9/3 AKI - Cr stable on most recent labs Anemia - Transfuse to keep hemoglobin above 7. Consider IV iron supplementation - she has short gut so worried she will not absorb iron well orally. Last PT-INR 9/15 was 1.3.  Hx metastatic breast cancer   LOS: 19 days     Juliet Rude, [facility] 01/01/2023, 11:22 AM Please see Amion for pager number during day hours 7:00am-4:30pm

## 2023-01-01 NOTE — TOC Progression Note (Signed)
Transition of Care [facility]) - Progression Note    Patient Details  Name: [name] MRN: [id number] Date of Birth: 02-19-1976  [facility] CM/SW Contact  Lavenia Atlas, RN Phone Number: 01/01/2023, 11:54 AM  Clinical Narrative:  Received secure chat from Lauren w/CIR, as patient is not appropriate for acute inpatient rehab,as she does not have support at home due to her daughter works at night and noone available to stay with patient.   This RNCM spoke with patient at bedside to advise of ineligible for CIR. This RNCM advised patient of SNF placement however she has barriers to SNF placement to include: age, TPN, insurance. Patient is in agreement with placment with SNF near her home.   TOC will continue to follow.    TOC following      Expected Discharge Plan: Home/Self Care (unsure at this time) Barriers to Discharge: Continued Medical Work up  Expected Discharge Plan and Services In-house Referral: Hospice / Palliative Care Discharge Planning Services: CM Consult Post Acute Care Choice: NA Living arrangements for the past 2 months: Single Family Home                 DME Arranged: N/A DME Agency: NA       HH Arranged: NA HH Agency: NA         Social Determinants of Health (SDOH) Interventions SDOH Screenings   Food Insecurity: No Food Insecurity (11/21/2022)  Housing: Patient Unable To Answer (11/21/2022)  Transportation Needs: No Transportation Needs (11/21/2022)  Utilities: Not At Risk (11/21/2022)  Tobacco Use: Medium Risk (12/14/2022)    Readmission Risk Interventions    12/20/2022    3:50 PM 12/15/2022    2:43 PM  Readmission Risk Prevention Plan  Transportation Screening Complete Complete  PCP or Specialist Appt within 3-5 Days  Complete  HRI or Home Care Consult  Complete  Social Work Consult for Recovery Care Planning/Counseling  Complete  Palliative Care Screening  Complete  Medication Review Oceanographer) Complete Complete  PCP  or Specialist appointment within 3-5 days of discharge Complete   HRI or Home Care Consult Complete   SW Recovery Care/Counseling Consult Complete   Palliative Care Screening Complete   Skilled Nursing Facility Not Applicable

## 2023-01-01 NOTE — Procedures (Signed)
Ultrasound-guided diagnostic and therapeutic paracentesis performed yielding 90 cc of turbid, amber/blood-tinged fluid. No immediate complications. The fluid was sent to the lab for preordered studies. The ascites was small in volume but significantly multiloculated and despite catheter manipulation only the above amount of fluid could be aspirated today. EBL none.

## 2023-01-01 NOTE — Progress Notes (Signed)
ANTICOAGULATION CONSULT NOTE   Pharmacy Consult for heparin Indication: pulmonary embolus  Allergies  Allergen Reactions   Nsaids Other (See Comments)    Crohns disease/ IBD   Chlorhexidine Gluconate Itching    Able to use CHG swabs for ports, reaction with direct skin contact   Hydromorphone Other (See Comments)    Confusion/sedation   Lactose Intolerance (Gi) Other (See Comments)    Upset stomach/diarrhea    Patient Measurements: Height: 5\' 4"  (162.6 cm) Weight: 56.5 kg (124 lb 9 oz) IBW/kg (Calculated) : 54.7 Heparin Dosing Weight: 56.5 kg total body weight  Vital Signs: Temp: 100.6 F (38.1 C) (09/23 0600) Temp Source: Bladder (09/22 2000) BP: 99/62 (09/23 0600) Pulse Rate: 123 (09/23 0600)  Labs: Recent Labs    12/30/22 0514 12/30/22 1338 12/30/22 2128 12/31/22 0504 12/31/22 1529 12/31/22 2335 01/01/23 0456 01/01/23 0617  HGB 7.0*  --   --  8.6*  --   --  7.5*  --   HCT 22.0*  --   --  26.8*  --   --  24.0*  --   PLT 299  --   --  326  --   --  317  --   APTT  --  38*  --   --   --   --   --   --   LABPROT  --  16.0*  --   --   --   --   --   --   INR  --  1.3*  --   --   --   --   --   --   HEPARINUNFRC  --   --    < > 0.12* 0.18* 0.42  --  0.35  CREATININE 1.06*  --   --  0.72  --   --  1.01* 0.94   < > = values in this interval not displayed.    Estimated Creatinine Clearance: 65.3 mL/min (by C-G formula based on SCr of 0.94 mg/dL).   Medications:  No AC PTA.   Assessment: 47 yo F with a PMH of recurrent breast cancer not currently on treatment, who presented to the ED on 12/12/2022 with abdominal pain, nausea, vomiting diarrhea.  Patient was placed on heparin drip from 9/10-9/12 for new LLL PE. On 9/12 heparin drip was discontinued due to hemoglobin dropping and intermittent bleeding in ostomy.     On 9/21:  Pharmacy has been consulted to resume heparin drip. 9/21 Hgb: 7 >> received 1 unit PRBCs on 9/21.  Heparin drip started after transfusion  on 9/21.   Today 01/01/2023 Heparin level: 0.35, therapeutic on 1700 units/hr Hgb: 7.5, decreased from 8.6. Plt: 317 stable and wnl Per RN no signs of bleeding and no complications within infusion site. Will assess WOC note for any signs of bleeding  Plan for paracentesis with IR per Jeananne Rama, no plan to hold heparin for procedure  Goal of Therapy:  Heparin level 0.3-0.7 units/ml Monitor platelets by anticoagulation protocol: Yes   Plan:  -Continue heparin drip at 1700 units/hr -Daily CBC & heparin level -Monitor for signs/symptoms of bleeding.  Brunilda Eble 01/01/2023,7:21 AM

## 2023-01-01 NOTE — Progress Notes (Signed)
PROGRESS NOTE  [name]  DOB: [birth year]  PCP: Ollen Bowl, MD IRJ:[id number]  DOA: 12/12/2022  LOS: 19 days  Hospital Day: 21  Brief narrative: [name] is a 47 y.o. female with PMH significant for Crohn's disease with previous bowel obstruction s/p subtotal colectomy 2021, colonic stricture, IDA, breast cancer s/p mastectomy 2022 currently not on treatment. Recently hospitalized 8/10-8/15/2024 for bowel obstruction seen by GI and general surgery. Underwent EGD and flexible sigmoidoscopy and discharged on steroids.  9/3, patient presented to the ED with abdominal distention, pain, nausea, vomiting CT abd/pelvis showed markedly dilated distal small bowel consistent with chronic obstruction. No changes since prior study.  Patient was admitted to [facility] with a plan of GI consultation.  9/4, in the morning, within few hours of admission, patient was found pulseless on the floor in the bathroom in ED, estimated downtime about 5 minutes. ACLS was initiated. Concern for possible torsades de pointes and given IV mag, defibrillated.  ROSC achieved in about 5 minutes. She was intubated. She had copious vomitus in the ETT.  Notably, she had I-stat peri-code which showed hemoglobin of 5.5 down from 10 overnight.  Admitted to ICU In the ICU, patient had right femoral CVC placed.  Given 3 minutes of PRBC, 3 units of FFP.  Patient was started on vasopressors  9/5, underwent exploratory laparotomy.  Noted to have internal hernia with associated small bowel ischemia.  Underwent bowel resection. 9/7, back to OR for ostomy placement 9/8, weaned off Levophed 9/10, extubated 9/10, found to have PE in CT angio chest.  Started on heparin drip which was subsequently stopped the next day because of drop in hemoglobin 9/12, mental status improved, became more interactive.  However failed swallow eval. 9/16, transferred out to [facility] 9/16, CT abdomen showed peritoneal fluid collection 9/17,  underwent paracentesis, 500 cc of clear yellow fluid drained 9/22, CT abdomen raised concern of early developing obstruction, peritonitis.  It also showed large volume ascites per general surgery 9/23, peritoneal drainage planned by IR  Patient's hospitalization has been prolonged because of persistent fever, persistent high output from ostomy, anemia requiring blood transfusion, dysphagia.  Subjective: Patient was seen and examined this morning.  Lying on bed.  Frustrated and in partially controlled pain Family not at bedside Continues to have fever, tachycardia Labs from this morning showed drop in hemoglobin.  Assessment and plan: Internal hernia with bowel ischemia  prior subtotal colectomy for Crohn's disease s/p ex lap with small bowel resection -9/5 Dr. Hillery Hunter s/p ex lap, ileostomy, abdominal closure 9/7 Dr. Hillery Hunter General Surgery following.    Short gut syndrome Currently only has 100 cm of small bowel left and hence started syndrome.  High ostomy output.   TPN dependent To slow down ostomy output, patient is currently on Imodium, iron, FiberCon, Imodium  Dysphagia Postextubation, patient failed speech therapy reevaluation multiple amps.  Gradually improving. Currently on dysphagia 1 diet, tube feeding during the day and TPN during the night  Persistent sepsis Patient continues to have persistent fever, tachycardia and leukocytosis Extensive workup done.  9/19, ID consulted.  Currently on ciprofloxacin, Flagyl and micafungin. 9/22, CT abdomen raised concern of early developing obstruction, peritonitis.  It also showed large volume ascites per general surgery 9/23, peritoneal drainage planned by IR Recent Labs  Lab 12/28/22 0346 12/29/22 0529 12/30/22 0514 12/31/22 0504 01/01/23 0456  WBC 21.0* 18.2* 17.1* 18.7* 17.6*   Persistent tachycardia Primarily due to sepsis but also contributed by effective hypovolemia and  pulm embolism. Blood pressure is stable.   Currently on Metropol 50 mg twice daily.    AKI Likely due to ATN related to septic shock. Improving currently. Recent Labs    12/23/22 0803 12/24/22 0408 12/25/22 0334 12/26/22 0500 12/27/22 0823 12/28/22 0346 12/30/22 0514 12/31/22 0504 01/01/23 0456 01/01/23 0617  BUN 57* 52* 51* 57* 53* 48* 40* 35* 33* 34*  CREATININE 1.61* 1.48* 1.41* 1.76* 1.47* 1.46* 1.06* 0.72 1.01* 0.94   Pulmonary embolism Left lower lobe segmental PE diagnosed on 9/10 Anticoagulation plan as below  Acute on chronic anemia Hemoglobin trend as below.  Received a total of 9 units of PRBC so far. 9/21, heparin drip was resumed.  Noted to drop in hemoglobin today.  If confirmed and repeat blood draw, will need to stop heparin drip. Recent Labs    11/20/22 0252 11/21/22 0308 12/28/22 0346 12/29/22 0529 12/30/22 0514 12/31/22 0504 01/01/23 0456  HGB 8.9*   < > 7.6* 7.2* 7.0* 8.6* 7.5*  MCV 79.2*   < > 91.4 93.4 94.8 88.2 90.2  VITAMINB12 1,244*  --   --   --   --   --   --   FOLATE 16.2  --   --   --   --   --   --   FERRITIN 160  --   --   --   --   --   --   TIBC 118*  --   --   --   --   --   --   IRON 22*  --   --   --   --   --   --   RETICCTPCT 0.9  --   --   --   --   --   --    < > = values in this interval not displayed.   Hyperglycemia A1c 5.7 on 9//24 Continue SSI/Accu-Cheks Recent Labs  Lab 12/31/22 0827 12/31/22 1314 12/31/22 1945 12/31/22 2349 01/01/23 0752  GLUCAP 95 99 96 138* 108*    Loss of voice Postextubation, patient was noted to have dysphagia as well as loss of voice. If no improvement, can involve ENT after acute issues resolve.  Recurrent breast cancer: Follows up with oncology Dr. Pamelia Hoit   Mobility: Encourage ambulation once hemodynamically stabilizes  Goals of care   Code Status: Full Code     DVT prophylaxis:  Place and maintain sequential compression device Start: 12/15/22 1031 SCDs Start: 12/13/22 0413   Antimicrobials: IV ciprofloxacin, IV  Flagyl, micafungin Fluid: TPN Consultants: General surgery, ID Family Communication: Daughter not at bedside today  Status: Inpatient Level of care:  Stepdown   Patient is from: Home Needs to continue in-hospital care: Remains  tachycardic, pending peritoneal drainage placement by IR today. Anticipated d/c to: Pending clinical course   Diet:  Diet Order             DIET - DYS 1 Fluid consistency: Nectar Thick  Diet effective now                   Scheduled Meds:  acetaminophen  1,000 mg Per Tube TID   ascorbic acid  500 mg Per Tube BID   diphenoxylate-atropine  1 tablet Per Tube BID   famotidine  20 mg Per Tube Daily   feeding supplement ([employer] STANDARD 1.4)  180 mL Per Tube Daily   feeding supplement ([employer] STANDARD 1.4)  325 mL Oral BID BM   ferrous  sulfate  300 mg Per Tube BID   fiber  1 packet Per Tube TID   gabapentin  200 mg Per Tube Q8H   insulin aspart  0-9 Units Subcutaneous 4 times per day   loperamide HCl  4 mg Per Tube QID   metoprolol tartrate  50 mg Per Tube BID   mouth rinse  15 mL Mouth Rinse 4 times per day   sodium chloride flush  10-40 mL Intracatheter Q12H   vitamin E  100 Units Per Tube Daily   zinc sulfate  220 mg Per Tube Daily    PRN meds: albuterol, artificial tears, fentaNYL (SUBLIMAZE) injection, food thickener, labetalol, lip balm, LORazepam, [DISCONTINUED] ondansetron **OR** ondansetron (ZOFRAN) IV, mouth rinse, mouth rinse, oxyCODONE, silver nitrate applicators, sodium chloride flush   Infusions:   ciprofloxacin Stopped (01/01/23 1106)   heparin 1,700 Units/hr (01/01/23 0600)   metronidazole Stopped (01/01/23 1106)   micafungin (MYCAMINE) 100 mg in sodium chloride 0.9 % 100 mL IVPB Stopped (12/31/22 1624)   phytonadione (VITAMIN K) 2 mg in dextrose 5 % 50 mL IVPB Stopped (12/28/22 1642)   TPN CYCLIC-ADULT (ION)      Antimicrobials: Anti-infectives (From admission, onward)    Start     Dose/Rate Route Frequency Ordered  Stop   12/29/22 1215  ciprofloxacin (CIPRO) IVPB 400 mg        400 mg 200 mL/hr over 60 Minutes Intravenous 2 times daily 12/29/22 1123     12/29/22 1215  metroNIDAZOLE (FLAGYL) IVPB 500 mg        500 mg 100 mL/hr over 60 Minutes Intravenous 2 times daily 12/29/22 1123     12/28/22 1430  micafungin (MYCAMINE) 100 mg in sodium chloride 0.9 % 100 mL IVPB        100 mg 105 mL/hr over 1 Hours Intravenous Every 24 hours 12/28/22 1335     12/23/22 2000  vancomycin (VANCOREADY) IVPB 1250 mg/250 mL  Status:  Discontinued        1,250 mg 166.7 mL/hr over 90 Minutes Intravenous Every 36 hours 12/22/22 0612 12/22/22 1103   12/22/22 0645  vancomycin (VANCOREADY) IVPB 1250 mg/250 mL        1,250 mg 166.7 mL/hr over 90 Minutes Intravenous  Once 12/22/22 0551 12/22/22 0805   12/22/22 0600  piperacillin-tazobactam (ZOSYN) IVPB 3.375 g  Status:  Discontinued        3.375 g 12.5 mL/hr over 240 Minutes Intravenous Every 8 hours 12/22/22 0551 12/29/22 1123   12/13/22 1000  piperacillin-tazobactam (ZOSYN) IVPB 3.375 g  Status:  Discontinued        3.375 g 12.5 mL/hr over 240 Minutes Intravenous Every 8 hours 12/13/22 0833 12/20/22 1333       Objective: Vitals:   01/01/23 1000 01/01/23 1042  BP: 119/76   Pulse: (!) 132 (!) 107  Resp: (!) 23 17  Temp: (!) 100.6 F (38.1 C) 100.2 F (37.9 C)  SpO2: 100% 99%    Intake/Output Summary (Last 24 hours) at 01/01/2023 1114 Last data filed at 01/01/2023 0900 Gross per 24 hour  Intake 2439.22 ml  Output 4350 ml  Net -1910.78 ml   Filed Weights   12/30/22 0500 12/31/22 0500 01/01/23 0500  Weight: 63.4 kg 65.3 kg 56.5 kg   Weight change: -8.8 kg Body mass index is 21.38 kg/m.   Physical Exam: General exam: Pleasant, [name].  Uncomfortable, in pain Skin: No rashes, lesions or ulcers. HEENT: Atraumatic, normocephalic, no obvious bleeding.  Dobbhoff  tube remains clamped Lungs: Diminished air entry bilaterally.  Clear to  auscultation bilaterally CVS: Has persistent tachycardia, regular rhythm, no murmur GI/Abd soft, ostomy bag attached to drain, with greenish output  CNS: Alert, awake, oriented x 3 Psychiatry: Sad affect Extremities: No pedal edema, no calf tenderness  Data Review: I have personally reviewed the laboratory data and studies available.  F/u labs ordered Unresulted Labs (From admission, onward)     Start     Ordered   01/01/23 1200  CBC  Once,   R       Question:  Specimen collection method  Answer:  Unit=Unit collect   01/01/23 0914   01/01/23 0500  Triglycerides  Every Mon,Thu (0500),   R     Question:  Specimen collection method  Answer:  Unit=Unit collect   12/30/22 1118   01/01/23 0500  Calcium, ionized  Tomorrow morning,   R       Question:  Specimen collection method  Answer:  Unit=Unit collect   12/31/22 1553   12/28/22 0500  Vitamin A  Tomorrow morning,   R       Question:  Specimen collection method  Answer:  Unit=Unit collect   12/27/22 1103   12/28/22 0500  Vitamin E  Tomorrow morning,   R       Question:  Specimen collection method  Answer:  Unit=Unit collect   12/27/22 1103   12/28/22 0500  Vitamin K1, Serum  Tomorrow morning,   R       Question:  Specimen collection method  Answer:  Unit=Unit collect   12/27/22 1103   12/27/22 0844  Expectorated Sputum Assessment w Gram Stain, Rflx to Resp Cult  Once,   R        12/27/22 0902   12/25/22 0600  Comprehensive metabolic panel  Every Mon,Thu,   R     Question:  Specimen collection method  Answer:  Unit=Unit collect   12/21/22 0846   12/20/22 0500  CBC  Daily,   R     Comments: While on heparin drip   Question:  Specimen collection method  Answer:  Unit=Unit collect   12/19/22 1720   12/18/22 0500  Magnesium  (Standard TPN Labs (all labs to be drawn at 0500))  Every Mon,Thu (0500),   R     Question:  Specimen collection method  Answer:  Unit=Unit collect   12/17/22 1152   12/18/22 0500  Phosphorus  (Standard TPN Labs  (all labs to be drawn at 0500))  Every Mon,Thu (0500),   R     Question:  Specimen collection method  Answer:  Unit=Unit collect   12/17/22 1152   12/14/22 0000  Prealbumin  Weekly,   R      12/13/22 1839            Total time spent in review of labs and imaging, patient evaluation, formulation of plan, documentation and communication with family: 55 minutes  Signed, Lorin Glass, MD [facility] 01/01/2023

## 2023-01-01 NOTE — Progress Notes (Signed)
PHARMACY - TOTAL PARENTERAL NUTRITION CONSULT NOTE   Indication: Short bowel syndrome  Patient Measurements: Height: 5\' 4"  (162.6 cm) Weight: 56.5 kg (124 lb 9 oz) IBW/kg (Calculated) : 54.7   Body mass index is 21.38 kg/m. Usual Weight: 46.3 kg from 8/28  Assessment: 47 y.o. female with PMH of recurrent breast cancer not currently on treatment, Crohn's disease s/p subtotal colectomy in 2021, recurrent obstructions, colonic stricture, recent hospitalization 8/10-8/15/2024 for obstruction who presented on 12/12/2022 with abdominal pain, nausea, vomiting diarrhea.    9/4 Admit; found down in ED bathroom with no pulse, CPR > intubated 9/5 Exp-lap, found to have internal hernia with small bowel ischemia, s/p SBR only 105 cm of small bowel remaining 9/7 OR for closure, ostomy creation 9/8 pharmacy consulted to manage TPN, transitioned to cyclic TPN 9/15 due to plan for long-term TPN for malabsorption with short gut.  Glucose / Insulin: no Hx DM - steroids PTA for Crohn's disease; weaned off as of 9/13 - CBGs 95-99 while TPN off, CBGs 136-138 while on TPN (goal < 180) - SSI: 1 unit given in past 24hrs Electrolytes: Na slighlty low, other electrolytes WNL, CorrCa 9.2 (iCa ip) Renal: SCr <1. BUN 34 (AKI s/p arrest, now improved)  Hepatic: LFTs, Tbili WNL.  Alk Phos elevated at 148, Albumin low  - TG: WNL (elevated 9/10, but normalized after stopping propofol 9/11) I/O: net -2L / 24 hrs - UOP: 3125 mL - Ileostomy: 1875 - Drains: (none recorded) GI Imaging:  - CTA 9/10 shows new PE, but also abdominal ascites - CT Abd: Large volume ascites GI Surgeries / Procedures:  - OR 9/5 ex lap, internal hernia w/ small bowel ischemia, SBR & celiotomy; > 105 cm small bowel remaining - OR 9/7 for closure, ostomy creation - 9/17: Paracentesis - 9/23 paracentesis   Central access: triple lumen CVC R subclavian 9/4, triple lumen CVC R femoral 9/4;  PAC 05/02/22 TPN start date: 12/17/22  Nutritional  Goals: TPN provides 94 g protein and 1680 kcal per day  RD Assessment: Estimated Needs Total Energy Estimated Needs: 1600-1800 Total Protein Estimated Needs: 85-95 grams Total Fluid Estimated Needs: 1.6L/day  Current Nutrition:  TPN  Molli Posey Supplement po bid 9/20: diet advanced to DYS 1 with nectar thick liquids  CCS MD requesting repletion of vitamins ADEK.  -Vitamin A level pending prior to supplementation > Dietician to follow -Vitamin K IV weekly, Vitamin D and E per tube  Plan: Continue TPN cycle on 12 hr cycle at 18:00 tonight Max glucose infusion rate 6.32 mg/kg/min Electrolytes in TPN:  Na - 120 mEq/L K - 55 mEq/L Ca - 5 mEq/L  Mg - 10 mEq/L Phos - 15 mmol/L Cl:Ac ratio - 1:2 Add standard MVI and trace elements to TPN Chromium on hold d/t critical shortage Continue CBG checks 4x daily while cycling TPN (1-2 hr after completing ramp-up, on TPN, 1 hr after TPN stopped, off TPN) Monitor TPN labs on Mon/Thurs and as needed   Lynann Beaver PharmD, BCPS [facility] main pharmacy [phone] 01/01/2023 7:37 AM

## 2023-01-02 DIAGNOSIS — K90829 Short bowel syndrome, unspecified: Secondary | ICD-10-CM | POA: Diagnosis not present

## 2023-01-02 LAB — CBC
HCT: 24.7 % — ABNORMAL LOW (ref 36.0–46.0)
Hemoglobin: 7.7 g/dL — ABNORMAL LOW (ref 12.0–15.0)
MCH: 28.3 pg (ref 26.0–34.0)
MCHC: 31.2 g/dL (ref 30.0–36.0)
MCV: 90.8 fL (ref 80.0–100.0)
Platelets: 340 10*3/uL (ref 150–400)
RBC: 2.72 MIL/uL — ABNORMAL LOW (ref 3.87–5.11)
RDW: 19 % — ABNORMAL HIGH (ref 11.5–15.5)
WBC: 18.6 10*3/uL — ABNORMAL HIGH (ref 4.0–10.5)
nRBC: 0 % (ref 0.0–0.2)

## 2023-01-02 LAB — GLUCOSE, CAPILLARY
Glucose-Capillary: 109 mg/dL — ABNORMAL HIGH (ref 70–99)
Glucose-Capillary: 90 mg/dL (ref 70–99)
Glucose-Capillary: 132 mg/dL — ABNORMAL HIGH (ref 70–99)

## 2023-01-02 LAB — BASIC METABOLIC PANEL
Anion gap: 9 (ref 5–15)
BUN: 33 mg/dL — ABNORMAL HIGH (ref 6–20)
CO2: 26 mmol/L (ref 22–32)
Calcium: 8 mg/dL — ABNORMAL LOW (ref 8.9–10.3)
Chloride: 103 mmol/L (ref 98–111)
Creatinine, Ser: 0.86 mg/dL (ref 0.44–1.00)
GFR, Estimated: >60 mL/min (ref >60)
Glucose, Bld: 121 mg/dL — ABNORMAL HIGH (ref 70–99)
Potassium: 4.2 mmol/L (ref 3.5–5.1)
Sodium: 138 mmol/L (ref 135–145)

## 2023-01-02 LAB — HEPARIN LEVEL (UNFRACTIONATED): Heparin Unfractionated: 0.31 IU/mL (ref 0.30–0.70)

## 2023-01-02 LAB — PATHOLOGIST SMEAR REVIEW

## 2023-01-02 MED ORDER — KATE FARMS STANDARD 1.4 PO LIQD: 360.0000 mL | Freq: Every day | ORAL | Status: DC

## 2023-01-02 MED ORDER — KATE FARMS STANDARD 1.4 PO LIQD
325.0000 mL | Freq: Two times a day (BID) | ORAL | Status: DC
  Administered 2023-01-02 – 2023-01-05 (×5): 325 mL via ORAL
  Administered 2023-01-06: 70 mL via ORAL
  Administered 2023-01-06: 325 mL via ORAL
  Administered 2023-01-07 (×2): 60 mL via ORAL
  Administered 2023-01-08 – 2023-01-15 (×13): 325 mL via ORAL
  Filled 2023-01-02 (×27): qty 325

## 2023-01-02 MED ORDER — KATE FARMS STANDARD 1.4 PO LIQD
360.0000 mL | Freq: Every day | ORAL | Status: DC
  Administered 2023-01-02: 360 mL
  Filled 2023-01-02: qty 650

## 2023-01-02 MED ORDER — TRAVASOL 10 % IV SOLN
INTRAVENOUS | Status: AC
  Filled 2023-01-02: qty 940.8

## 2023-01-02 MED ORDER — CHOLECALCIFEROL 10 MCG (400 UNIT) PO TABS
400.0000 [IU] | ORAL_TABLET | Freq: Every day | ORAL | Status: DC
  Administered 2023-01-02 – 2023-01-08 (×7): 400 [IU]
  Filled 2023-01-02 (×7): qty 1

## 2023-01-02 MED ORDER — KATE FARMS STANDARD 1.4 PO LIQD: 180.0000 mL | Freq: Every day | ORAL | Status: DC

## 2023-01-02 NOTE — Progress Notes (Signed)
Currently patient is on RA, vitals much improved. Patient stated she had episode of vomiting yesterday, but no trouble breathing. She said this episode hit all of a sudden and was unsure of exactly what caused her SOB. Reassurance provided to patient. Continue to monitor.

## 2023-01-02 NOTE — Progress Notes (Addendum)
Nutrition Follow-up  DOCUMENTATION CODES:   Non-severe (moderate) malnutrition in context of chronic illness  INTERVENTION:  - Plan to continue cyclic TPN x12 hours overnight. Meeting 100% of estimated needs.             - TPN management per Pharmacy   - Advance TF to 39mL/hr tonight per Surgery. Plan to continue nocturnal feeds x12 hours overnight. - [employer] 1.4 at 75mL/hr (1800-0600)   - Diet advancing to DYS 3 with nectar thickened liquids today per SLP recs. [employer] 1.4 orally BID, each supplement provides 455 kcal and 20 grams protein.    - Per Surgery, start supplementation of fat soluble vitamins D, E, and K. Checking levels to assess need for maintenance doses or repletion doses of supplementation.             - Vitamin D: LOW, alerted MD that supplementation needed, 50,000 units Q7 days x2 months.             - Vitamin E: pending, continue 100 units vitamin E daily per tube.             - Vitamin K: pending, IV Vitamin K supplementation per Surgery/Pharmacy.              - Vitamin A: pending; checking vitamin A prior to supplementation.    - Continue MVI in TPN.   - Continue 500mg  vitamin C BID and 220mg  zinc x14 days (ends 10/4) to promote wound healing.    - Daily weights while on TPN   Patient will need to be TPN dependent long term.    NUTRITION DIAGNOSIS:   Moderate Malnutrition related to chronic illness (Crohn's disease; metastatic breast cancer) as evidenced by moderate fat depletion, moderate muscle depletion, percent weight loss (24% in 4.5 months). *ongoing  GOAL:   Patient will meet greater than or equal to 90% of their needs *met with TPN  MONITOR:   Labs, Weight trends, TF tolerance, I & O's, Diet advancement (TPN)  REASON FOR ASSESSMENT:   Consult Enteral/tube feeding initiation and management  ASSESSMENT:   47 y.o. female with PMH of recurrent breast cancer not currently on treatment, Crohn's disease s/p subtotal colectomy in 2021,  recurrent obstructions, colonic stricture, recent hospitalization 8/10-8/15/2024 for obstruction who presented on 12/12/2022 with abdominal pain, nausea, vomiting diarrhea.  9/4 Admit; found down in ED bathroom with no pulse, intubated 9/5 s/p ex-lap, found to have internal hernia with small bowel ischemia, only ~100cm of small bowel remaining 9/7 ex-lap, I&D, end ileostomy 9/8 TPN started at 44mL/hr 9/9 TPN increased to 68mL/hr 9/10 extubated, NGT removed but then replaced. 9/12: TF stopped 9/14: small bore NGT placed for medications, MBS performed 9/18: trickle TF (19mL/hr) of V[employer] 1.2 started, starting fat soluble vitamin supplementation 9/19: Transitioned to 12 hour nocturnal TF 9/20: MBS -> diet advanced to DYS 1 with nectar thick liquids  Patient had SLP eval today and advanced to a DYS 3 diet with nectar thickened liquids.'  Met with patient at bedside. She reports not liking the DYS 1 diet. Excited to be able to have more options and eat more solid food. Of note, RN reports patient patient worrying about her blood sugars. Inquired about this with patient and she reports she was told by someone on Saturday she needed to be careful and not let her blood sugar get too elevated. She also notes she has been on insulin this admission and she wasn't on any before. Per chart  review, HA1C is 5.7 and patient's blood sugars have been very well controlled (89-132 x24 hours). Discussed these with patient and discussed that her body is stressed from all the medical problems she has going on and are not likely reflective of how her blood sugars are normally. Discussed that the goal would be for her to try and eat well as tolerated so would not want her to restrict her intake. Patient endorsed understanding. She is agreeable to try [employer] 1.4 orally again to support oral intake.   She endorses her tube feeds have been going well. They have been running at 19mL/hr for 12 hours overnight.  Per Surgery,  plan to increase to 110mL/hr this evening. Discussed patient with Surgery PA and plan to continue nocturnal feeds, increasing to 39mL/hr, to promote intake during the day.   She also remains on goal cyclic TPN for 12 hours overnight.  As patient has only 100cm bowel left, she will need to be TPN dependent long term. 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     Admit weight: 125# Current weight: 121# I&O's: -7/8L  Medications reviewed and include: 500mg  vitamin C BID, 220mg  zinc x14 days (ends 10/4), 300mg  ferrous sulfate,   Labs reviewed:  HA1C 5.7 Blood Glucose 89-132 x24 hours Triglycerides 69 (as of 9/23)  Diet Order:   Diet Order             DIET DYS 3 Fluid consistency: Nectar Thick  Diet effective now                   EDUCATION NEEDS:  Education needs have been addressed  Skin:  Skin Assessment: Reviewed RN Assessment Skin Integrity Issues:: Incisions Incisions: Abdomen  Last BM:  9/24 - ileostomy  Height:  Ht Readings from Last 1 Encounters:  12/13/22 5\' 4"  (1.626 m)   Weight:  Wt Readings from Last 1 Encounters:  01/02/23 54.9 kg    BMI:  Body mass index is 20.78 kg/m.  Estimated Nutritional Needs:  Kcal:  1600-1800 Protein:  85-95 grams Fluid:  1.6L/day    Shelle Iron RD, LDN For contact information, refer to [facility].

## 2023-01-02 NOTE — Progress Notes (Signed)
PROGRESS NOTE  [name]  DOB: 02/26/1976  PCP: Ollen Bowl, MD EAV:[id number]  DOA: 12/12/2022  LOS: 20 days  Hospital Day: 22  Brief narrative: [name] is a 47 y.o. female with PMH significant for Crohn's disease with previous bowel obstruction s/p subtotal colectomy 2021, colonic stricture, IDA, breast cancer s/p mastectomy 2022 currently not on treatment. Recently hospitalized 8/10-8/15/2024 for bowel obstruction seen by GI and general surgery. Underwent EGD and flexible sigmoidoscopy and discharged on steroids.  9/3, patient presented to the ED with abdominal distention, pain, nausea, vomiting CT abd/pelvis showed markedly dilated distal small bowel consistent with chronic obstruction. No changes since prior study.  Patient was admitted to [facility] with a plan of GI consultation.  9/4, in the morning, within few hours of admission, patient was found pulseless on the floor in the bathroom in ED, estimated downtime about 5 minutes. ACLS was initiated. Concern for possible torsades de pointes and given IV mag, defibrillated.  ROSC achieved in about 5 minutes. She was intubated. She had copious vomitus in the ETT.  Notably, she had I-stat peri-code which showed hemoglobin of 5.5 down from 10 overnight.  Admitted to ICU In the ICU, patient had right femoral CVC placed.  Given 3 minutes of PRBC, 3 units of FFP.  Patient was started on vasopressors  9/5, underwent exploratory laparotomy.  Noted to have internal hernia with associated small bowel ischemia.  Underwent bowel resection. 9/7, back to OR for ostomy placement 9/8, weaned off Levophed 9/10, extubated 9/10, found to have PE in CT angio chest.  Started on heparin drip which was subsequently stopped the next day because of drop in hemoglobin 9/12, mental status improved, became more interactive.  However failed swallow eval. 9/16, transferred out to [facility] 9/16, CT abdomen showed peritoneal fluid collection 9/17,  underwent paracentesis, 500 cc of clear yellow fluid drained 9/22, CT abdomen raised concern of early developing obstruction, peritonitis.  9/23, paracentesis by IR.  Drained 90 cc of turbid, amber/blood-tinged fluid  Patient's hospitalization has been prolonged because of persistent fever, persistent high output from ostomy, anemia requiring blood transfusion, dysphagia.  Subjective: Patient was seen and examined this morning.  Lying down on bed.  Frustrated and in partially controlled pain Family not at bedside Continues to have fever, tachycardia Labs from this morning showed drop in hemoglobin.  Assessment and plan: Internal hernia with bowel ischemia  prior subtotal colectomy for Crohn's disease s/p ex lap with small bowel resection -9/5 Dr. Hillery Hunter s/p ex lap, ileostomy, abdominal closure 9/7 Dr. Hillery Hunter General Surgery following.    Short gut syndrome Currently only has 100 cm of small bowel left and hence started syndrome.  Continues to have significant ostomy output.   TPN dependent To slow down ostomy output, patient is currently on Imodium, iron, FiberCon, Imodium  Dysphagia Postextubation, patient failed speech therapy reevaluation multiple amps.  Gradually improving. Currently on dysphagia 3 diet, tube feeding during the day and TPN during the night  Persistent sepsis Patient continues to have persistent fever, tachycardia and leukocytosis Extensive workup done.  9/19, ID consulted.  Currently on ciprofloxacin, Flagyl and micafungin. 9/22, CT abdomen raised concern of early developing obstruction, peritonitis. 9/23, paracentesis by IR.  Drained 90 cc of turbid, amber/blood-tinged fluid Recent Labs  Lab 12/30/22 0514 12/31/22 0504 01/01/23 0456 01/01/23 1110 01/02/23 0517  WBC 17.1* 18.7* 17.6* 18.1* 18.6*   Persistent tachycardia Primarily due to sepsis but also contributed by effective hypovolemia and pulm embolism. Blood  pressure is stable.  Currently on  Metropol 50 mg twice daily.  She was also given IV fluid but was held after she developed anasarca with low albumin.  AKI Likely due to ATN related to septic shock. Improving currently. Recent Labs    12/24/22 0408 12/25/22 0334 12/26/22 0500 12/27/22 0823 12/28/22 0346 12/30/22 0514 12/31/22 0504 01/01/23 0456 01/01/23 0617 01/02/23 0517  BUN 52* 51* 57* 53* 48* 40* 35* 33* 34* 33*  CREATININE 1.48* 1.41* 1.76* 1.47* 1.46* 1.06* 0.72 1.01* 0.94 0.86   Pulmonary embolism Left lower lobe segmental PE diagnosed on 9/10 9/21, heparin drip was resumed.    Acute on chronic anemia Hemoglobin trend as below.  Received a total of 9 units of PRBC so far. 9/21, heparin drip was resumed.  Hemoglobin trend as below.  Stable in last 24 hours.  No active bleeding.  Continue to monitor Recent Labs    11/20/22 0252 11/21/22 0308 12/30/22 0514 12/31/22 0504 01/01/23 0456 01/01/23 1110 01/02/23 0517  HGB 8.9*   < > 7.0* 8.6* 7.5* 7.8* 7.7*  MCV 79.2*   < > 94.8 88.2 90.2 88.3 90.8  VITAMINB12 1,244*  --   --   --   --   --   --   FOLATE 16.2  --   --   --   --   --   --   FERRITIN 160  --   --   --   --   --   --   TIBC 118*  --   --   --   --   --   --   IRON 22*  --   --   --   --   --   --   RETICCTPCT 0.9  --   --   --   --   --   --    < > = values in this interval not displayed.   Hyperglycemia A1c 5.7 on 9//24 Continue SSI/Accu-Cheks Recent Labs  Lab 01/01/23 1223 01/01/23 2012 01/01/23 2355 01/02/23 0821 01/02/23 1153  GLUCAP 89 132* 132* 109* 90    Loss of voice Postextubation, patient was noted to have dysphagia as well as loss of voice. If no improvement, we'll involve ENT after acute issues resolve.  Recurrent breast cancer: Follows up with oncology Dr. Pamelia Hoit   Mobility: Encourage ambulation once hemodynamically stabilizes  Goals of care   Code Status: Full Code     DVT prophylaxis:  Place and maintain sequential compression device Start: 12/15/22  1031 SCDs Start: 12/13/22 0413   Antimicrobials: IV ciprofloxacin, IV Flagyl, micafungin Fluid: TPN Consultants: General surgery, ID Family Communication: Daughter not at bedside today  Status: Inpatient Level of care:  Stepdown   Patient is from: Home Needs to continue in-hospital care: Remains  tachycardic, febrile Anticipated d/c to: Pending clinical course   Diet:  Diet Order             DIET DYS 3 Fluid consistency: Nectar Thick  Diet effective now                   Scheduled Meds:  acetaminophen  1,000 mg Per Tube TID   ascorbic acid  500 mg Per Tube BID   cholecalciferol  400 Units Per Tube Daily   diphenoxylate-atropine  1 tablet Per Tube BID   famotidine  20 mg Per Tube Daily   feeding supplement (KATE FARMS STANDARD 1.4)  325 mL Oral BID BM  feeding supplement (KATE FARMS STANDARD 1.4)  360 mL Per Tube Daily   ferrous sulfate  300 mg Per Tube BID   fiber  1 packet Per Tube TID   gabapentin  200 mg Per Tube Q8H   insulin aspart  0-9 Units Subcutaneous 4 times per day   loperamide HCl  4 mg Per Tube QID   metoprolol tartrate  50 mg Per Tube BID   mouth rinse  15 mL Mouth Rinse 4 times per day   sodium chloride flush  10-40 mL Intracatheter Q12H   vitamin E  100 Units Per Tube Daily   zinc sulfate  220 mg Per Tube Daily    PRN meds: albuterol, artificial tears, fentaNYL (SUBLIMAZE) injection, food thickener, labetalol, lip balm, LORazepam, [DISCONTINUED] ondansetron **OR** ondansetron (ZOFRAN) IV, mouth rinse, mouth rinse, oxyCODONE, silver nitrate applicators, sodium chloride flush   Infusions:   ciprofloxacin Stopped (01/02/23 0914)   heparin 1,700 Units/hr (01/02/23 1126)   metronidazole Stopped (01/02/23 0903)   micafungin (MYCAMINE) 100 mg in sodium chloride 0.9 % 100 mL IVPB Stopped (01/02/23 1505)   phytonadione (VITAMIN K) 2 mg in dextrose 5 % 50 mL IVPB Stopped (12/28/22 1642)   TPN CYCLIC-ADULT (ION)      Antimicrobials: Anti-infectives  (From admission, onward)    Start     Dose/Rate Route Frequency Ordered Stop   12/29/22 1215  ciprofloxacin (CIPRO) IVPB 400 mg        400 mg 200 mL/hr over 60 Minutes Intravenous 2 times daily 12/29/22 1123     12/29/22 1215  metroNIDAZOLE (FLAGYL) IVPB 500 mg        500 mg 100 mL/hr over 60 Minutes Intravenous 2 times daily 12/29/22 1123     12/28/22 1430  micafungin (MYCAMINE) 100 mg in sodium chloride 0.9 % 100 mL IVPB        100 mg 105 mL/hr over 1 Hours Intravenous Every 24 hours 12/28/22 1335     12/23/22 2000  vancomycin (VANCOREADY) IVPB 1250 mg/250 mL  Status:  Discontinued        1,250 mg 166.7 mL/hr over 90 Minutes Intravenous Every 36 hours 12/22/22 0612 12/22/22 1103   12/22/22 0645  vancomycin (VANCOREADY) IVPB 1250 mg/250 mL        1,250 mg 166.7 mL/hr over 90 Minutes Intravenous  Once 12/22/22 0551 12/22/22 0805   12/22/22 0600  piperacillin-tazobactam (ZOSYN) IVPB 3.375 g  Status:  Discontinued        3.375 g 12.5 mL/hr over 240 Minutes Intravenous Every 8 hours 12/22/22 0551 12/29/22 1123   12/13/22 1000  piperacillin-tazobactam (ZOSYN) IVPB 3.375 g  Status:  Discontinued        3.375 g 12.5 mL/hr over 240 Minutes Intravenous Every 8 hours 12/13/22 0833 12/20/22 1333       Objective: Vitals:   01/02/23 1400 01/02/23 1500  BP: 104/73 113/76  Pulse: (!) 117 (!) 123  Resp: 14 20  Temp: 99.7 F (37.6 C) 99.9 F (37.7 C)  SpO2: 100% 97%    Intake/Output Summary (Last 24 hours) at 01/02/2023 1624 Last data filed at 01/02/2023 1529 Gross per 24 hour  Intake 2775.4 ml  Output 4270 ml  Net -1494.6 ml   Filed Weights   12/31/22 0500 01/01/23 0500 01/02/23 0500  Weight: 65.3 kg 56.5 kg 54.9 kg   Weight change: -1.6 kg Body mass index is 20.78 kg/m.   Physical Exam: General exam: Pleasant, [name].  Uncomfortable in pain Skin:  No rashes, lesions or ulcers. HEENT: Atraumatic, normocephalic, no obvious bleeding.  Dobbhoff tube  remains clamped Lungs: Diminished air entry bilaterally.  Clear to auscultation bilaterally CVS: Has persistent tachycardia, regular rhythm, no murmur GI/Abd soft, ostomy bag attached to drain, with greenish output  CNS: Alert, awake, oriented x 3 Psychiatry: Sad affect Extremities: No pedal edema, no calf tenderness  Data Review: I have personally reviewed the laboratory data and studies available.  F/u labs ordered Unresulted Labs (From admission, onward)     Start     Ordered   01/02/23 0500  Heparin level (unfractionated)  Daily,   R     Question:  Specimen collection method  Answer:  Unit=Unit collect   01/01/23 1500   01/01/23 1153  Aerobic/Anaerobic Culture w Gram Stain (surgical/deep wound)  RELEASE UPON ORDERING,   TIMED        01/01/23 1153   01/01/23 1130  Acid Fast Smear (AFB)  (AFB smear + Culture w reflexed sensitivities panel)  Once,   R       Placed in "And" Linked Group   01/01/23 1129   01/01/23 1130  Acid Fast Culture with reflexed sensitivities  (AFB smear + Culture w reflexed sensitivities panel)  Once,   R       Placed in "And" Linked Group   01/01/23 1129   01/01/23 0500  Triglycerides  Every Mon,Thu (0500),   R     Question:  Specimen collection method  Answer:  Unit=Unit collect   12/30/22 1118   01/01/23 0500  Calcium, ionized  Tomorrow morning,   R       Question:  Specimen collection method  Answer:  Unit=Unit collect   12/31/22 1553   12/28/22 0500  Vitamin A  Tomorrow morning,   R       Question:  Specimen collection method  Answer:  Unit=Unit collect   12/27/22 1103   12/28/22 0500  Vitamin E  Tomorrow morning,   R       Question:  Specimen collection method  Answer:  Unit=Unit collect   12/27/22 1103   12/28/22 0500  Vitamin K1, Serum  Tomorrow morning,   R       Question:  Specimen collection method  Answer:  Unit=Unit collect   12/27/22 1103   12/27/22 0844  Expectorated Sputum Assessment w Gram Stain, Rflx to Resp Cult  Once,   R         12/27/22 0902   12/25/22 0600  Comprehensive metabolic panel  Every Mon,Thu,   R     Question:  Specimen collection method  Answer:  Unit=Unit collect   12/21/22 0846   12/20/22 0500  CBC  Daily,   R     Comments: While on heparin drip   Question:  Specimen collection method  Answer:  Unit=Unit collect   12/19/22 1720   12/18/22 0500  Magnesium  (Standard TPN Labs (all labs to be drawn at 0500))  Every Mon,Thu (0500),   R     Question:  Specimen collection method  Answer:  Unit=Unit collect   12/17/22 1152   12/18/22 0500  Phosphorus  (Standard TPN Labs (all labs to be drawn at 0500))  Every Mon,Thu (0500),   R     Question:  Specimen collection method  Answer:  Unit=Unit collect   12/17/22 1152   12/14/22 0000  Prealbumin  Weekly,   R      12/13/22 1839  Total time spent in review of labs and imaging, patient evaluation, formulation of plan, documentation and communication with family: 45 minutes  Signed, Lorin Glass, MD [facility] 01/02/2023

## 2023-01-02 NOTE — Progress Notes (Signed)
PHARMACY - TOTAL PARENTERAL NUTRITION CONSULT NOTE   Indication: Short bowel syndrome  Patient Measurements: Height: 5\' 4"  (162.6 cm) Weight: 54.9 kg (121 lb 0.5 oz) IBW/kg (Calculated) : 54.7   Body mass index is 20.78 kg/m. Usual Weight: 46.3 kg from 8/28  Assessment: 47 y.o. female with PMH of recurrent breast cancer not currently on treatment, Crohn's disease s/p subtotal colectomy in 2021, recurrent obstructions, colonic stricture, recent hospitalization 8/10-8/15/2024 for obstruction who presented on 12/12/2022 with abdominal pain, nausea, vomiting diarrhea.    9/4 Admit; found down in ED bathroom with no pulse, CPR > intubated 9/5 Exp-lap, found to have internal hernia with small bowel ischemia, s/p SBR only 105 cm of small bowel remaining 9/7 OR for closure, ostomy creation 9/8 pharmacy consulted to dose TPN, transitioned to cyclic TPN 9/15 due to plan for long-term TPN for malabsorption with short gut.  Glucose / Insulin: no Hx DM - steroids PTA for Crohn's disease; weaned off as of 9/13 - CBGs 108-89 while TPN off, CBGs 132 while on TPN (goal < 180) - SSI: 2 units given in past 24hrs Electrolytes: Electrolytes WNL, CorrCa 10 (iCa ip) Renal: SCr <1. BUN 33 (AKI s/p arrest, now improved)  Hepatic: LFTs, Tbili WNL.  Alk Phos elevated at 148, Albumin low  - TG: WNL (elevated 9/10, but normalized after stopping propofol 9/11) I/O: net -1L / 24 hrs - UOP: 2300 mL - Ileostomy: 1300 mL - Emesis/NG: 120 mL GI Imaging:  - CTA 9/10 shows new PE, but also abdominal ascites - CT Abd: Large volume ascites GI Surgeries / Procedures:  - OR 9/5 ex lap, internal hernia w/ small bowel ischemia, SBR & celiotomy; > 105 cm small bowel remaining - OR 9/7 for closure, ostomy creation - 9/17: Paracentesis - 9/23 paracentesis   Central access: triple lumen CVC R subclavian 9/6, [facility] 05/02/22 TPN start date: 12/17/22  Nutritional Goals: TPN provides 94 g protein and 1680 kcal per day  RD  Assessment: Estimated Needs Total Energy Estimated Needs: 1600-1800 Total Protein Estimated Needs: 85-95 grams Total Fluid Estimated Needs: 1.6L/day  Current Nutrition:  TPN  Tube feeds: Molli Posey- CCS will advance to 30 ml/hr today and then considering transition to nocturnal TF only if she tolerates. PO diet advanced to DYS 1 with nectar thick liquids  CCS MD requesting repletion of vitamins ADEK.  - Levels in process 9/19 -Vitamin A level pending prior to supplementation > Dietician to follow -Vitamin K IV weekly, Vitamin D and E per tube  Plan: Continue cyclic TPN over 12 hr (18:00- 06:00) Max glucose infusion rate 6.5 mg/kg/min Electrolytes in TPN:  Na 120 mEq/L K 50 mEq/L Ca 5 mEq/L  Mg 10 mEq/L Phos 15 mmol/L Cl:Ac ratio 1:2 Add standard MVI and trace elements to TPN Chromium on hold d/t critical shortage Continue CBG checks 4x daily while on cyclic TPN (1-2 hr after completing ramp-up, on TPN, 1 hr after TPN stopped, off TPN) Monitor TPN labs on Mon/Thurs and as needed   Lynann Beaver PharmD, BCPS [facility] main pharmacy [phone] 01/02/2023 7:30 AM

## 2023-01-02 NOTE — Progress Notes (Signed)
Physical Therapy Treatment Patient Details Name: [name] MRN: [id number] DOB: 24-Oct-1975 Today's Date: 01/02/2023   History of Present Illness Patient is a 47 year old female who presented on 9/3 with abdominal distension and nausea/vomiting. 9/4 patient was found in ED bathroom with no pulse with ROSC in about 5 mins. Patient was intubated at this time. 9/5 patient underwent exploratory laparotomy that revealed internal hernia with associated small bowel ischemia with patient undergoing bowel resection. 9/7 patient returned to OR for ostomy placement. Patient was extubated on 9/10 and also found to have PE. PMH: breast cancer, Chron's disease, IDA, recent hospitalization 8/10 to 8/15 with bowel obstruction and EGD    PT Comments  Pt is progressing well with mobility. She stood for 3 minutes in [place] and was able to perform weight shifting without buckling. Pt then ambulated 10' with RW, distance limited by fatigue, HR 132 walking, HR 117 at rest.     If plan is discharge home, recommend the following: A lot of help with walking and/or transfers;A lot of help with bathing/dressing/bathroom;Assistance with cooking/housework;Assist for transportation   Can travel by private vehicle        Equipment Recommendations  Rolling walker (2 wheels)    Recommendations for Other Services       Precautions / Restrictions Precautions Precautions: Fall Precaution Comments: abdominal precautions, wound vac, abominal binder, ostomy, nasal tube Restrictions Weight Bearing Restrictions: No     Mobility  Bed Mobility Overal bed mobility: Needs Assistance Bed Mobility: Supine to Sit     Supine to sit: Mod assist, Used rails, HOB elevated     General bed mobility comments: assist to raise trunk and pivot hips to EOB, VCs for technique    Transfers Overall transfer level: Needs assistance Equipment used: Rolling walker (2 wheels) Transfers: Sit to/from Stand Sit to Stand: Min assist,  +2 safety/equipment           General transfer comment: STS x 1 with St[place], then x1 with RW. Pt stood in [place] ~3 minutes and was able to perform weight shifts, and release grip bar with one hand.  VCs hand placement for STS with RW.    Ambulation/Gait Ambulation/Gait assistance: Contact guard assist, +2 safety/equipment Gait Distance (Feet): 10 Feet Assistive device: Rolling walker (2 wheels) Gait Pattern/deviations: Step-through pattern, Decreased stride length, Narrow base of support Gait velocity: decr     General Gait Details: HR 132 max with walking, HR 117 at rest, distance limited by fatigue, no buckling nor loss of balance, VCs to widen BOS   Stairs             Wheelchair Mobility     Tilt Bed    Modified Rankin (Stroke Patients Only)       Balance Overall balance assessment: Needs assistance Sitting-balance support: Bilateral upper extremity supported, Feet supported Sitting balance-Leahy Scale: Fair     Standing balance support: Reliant on assistive device for balance, Single extremity supported, During functional activity Standing balance-Leahy Scale: Poor                              Cognition Arousal: Alert Behavior During Therapy: WFL for tasks assessed/performed Overall Cognitive Status: Within Functional Limits for tasks assessed                                 General Comments: AxO x  3 pleasant and willing.  Soft to no voice        Exercises      General Comments        Pertinent Vitals/Pain Pain Assessment Faces Pain Scale: No hurt    Home Living                          Prior Function            PT Goals (current goals can now be found in the care plan section) Acute Rehab PT Goals Patient Stated Goal: to go home; walk, exercise, play with 1 y.o. grandson PT Goal Formulation: With patient Time For Goal Achievement: 01/08/23 Potential to Achieve Goals: Good Progress towards PT  goals: Progressing toward goals    Frequency    Min 1X/week      PT Plan      Co-evaluation PT/OT/SLP Co-Evaluation/Treatment: Yes Reason for Co-Treatment: For patient/therapist safety;To address functional/ADL transfers PT goals addressed during session: Mobility/safety with mobility;Proper use of DME        AM-PAC PT "6 Clicks" Mobility   Outcome Measure  Help needed turning from your back to your side while in a flat bed without using bedrails?: A Little Help needed moving from lying on your back to sitting on the side of a flat bed without using bedrails?: A Lot Help needed moving to and from a bed to a chair (including a wheelchair)?: A Lot Help needed standing up from a chair using your arms (e.g., wheelchair or bedside chair)?: A Lot Help needed to walk in hospital room?: A Little Help needed climbing 3-5 steps with a railing? : Total 6 Click Score: 13    End of Session Equipment Utilized During Treatment: Gait belt Activity Tolerance: Patient tolerated treatment well Patient left: in chair;with call bell/phone within reach Nurse Communication: Mobility status PT Visit Diagnosis: Unsteadiness on feet (R26.81);Muscle weakness (generalized) (M62.81);Difficulty in walking, not elsewhere classified (R26.2)     Time: 4098-1191 PT Time Calculation (min) (ACUTE ONLY): 31 min  Charges:    $Gait Training: 8-22 mins PT General Charges $$ ACUTE PT VISIT: 1 Visit                     Tamala Ser PT 01/02/2023  Acute Rehabilitation Services  Office [phone]

## 2023-01-02 NOTE — Progress Notes (Deleted)
Cardiology Office Note:    Date:  01/02/2023   ID:  [name], DOB Dec 22, 1975, MRN [id number]  PCP:  Ollen Bowl, MD   [facility] Providers Cardiologist:  Maisie Fus, MD     Referring MD: Ollen Bowl, MD   No chief complaint on file. Grade 1 DD  History of Present Illness:    [name] is a 47 y.o. 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 She had a recent echo in June that noted grade 1 DD.  Normal EKG. No family hx of cardiac dx. No smoking. No DM2.  Cardio-Onc Hx: - CVD Risk/ABCDE: none - Anthracycline therapy - No - HER2 therapy (trastuzumab and pertuzumab)- yes since 06/2020;  - Radiation - R chest wall - ICI- no - TKI- no   Past Medical History:  Diagnosis Date   Breast cancer (HCC)    right   Colon stricture (HCC) 05/07/2019   Colonic stricture (HCC) 11/19/2022   Crohn's colitis, with intestinal obstruction (HCC) 09/17/2013   Dx 2006 - right and left colon involved 2011 - pan-colitis 09/17/2013 left colitis 60-20 cm March 2021 subtotal colectomy ileosigmoid anastomosis for colonic strictures-Dr. Maisie Fus     Crohn's disease [facility])    Exacerbation of Crohn's disease of large intestine (HCC) 12/13/2022   Family history of breast cancer 06/11/2020   History of abdominal subtotal colectomy 11/19/2022   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 10/17/2010   Rectal bleeding    Uterine fibroid    Vitamin D deficiency 09/24/2013    Past Surgical History:  Procedure Laterality Date   AXILLARY LYMPH NODE DISSECTION Right 11/09/2020   Procedure: RIGHT AXILLARY LYMPH NODE DISSECTION;  Surgeon: Emelia Loron, MD;  Location: [facility];  Service: General;  Laterality: Right;   BIOPSY  07/01/2020   Procedure: BIOPSY;  Surgeon: Benancio Deeds, MD;  Location: [facility];  Service: Gastroenterology;;   BIOPSY  01/27/2021   Procedure: BIOPSY;  Surgeon: Benancio Deeds, MD;  Location: [facility];  Service: Gastroenterology;;   BIOPSY  09/07/2021   Procedure: BIOPSY;  Surgeon: Lynann Bologna, MD;  Location: [facility];  Service: Gastroenterology;;   BIOPSY  11/21/2022   Procedure: BIOPSY;  Surgeon: Lemar Lofty., MD;  Location: [facility];  Service: Gastroenterology;;   BREAST BIOPSY Right 03/24/2022   [place] RT BREAST BX W LOC DEV 1ST LESION IMG BX SPEC US GUIDE 03/24/2022 [facility]   BREAST BIOPSY  04/28/2022   [place] RT RADIOACTIVE SEED LOC 04/28/2022 [facility]   BREAST IMPLANT REMOVAL Right 08/07/2022   Procedure: REMOVAL  OF RIGHT BREAST IMPLANTS;  Surgeon: Allena Napoleon, MD;  Location: [facility];  Service: Plastics;  Laterality: Right;   BREAST RECONSTRUCTION WITH PLACEMENT OF TISSUE EXPANDER AND FLEX HD (ACELLULAR HYDRATED DERMIS) Bilateral 11/09/2020   Procedure: BILATERAL BREAST RECONSTRUCTION WITH PLACEMENT OF TISSUE EXPANDER AND FLEX HD (ACELLULAR HYDRATED DERMIS);  Surgeon: Allena Napoleon, MD;  Location: [facility];  Service: Plastics;  Laterality: Bilateral;   CAPSULOTOMY Bilateral 07/25/2021   Procedure: CAPSULOTOMY;  Surgeon: Allena Napoleon, MD;  Location: [facility];  Service: Plastics;  Laterality: Bilateral;   COLECTOMY  06/27/2019   COLONOSCOPY  2015   ESOPHAGOGASTRODUODENOSCOPY (EGD) WITH PROPOFOL N/A 11/21/2022   Procedure: ESOPHAGOGASTRODUODENOSCOPY (EGD) WITH PROPOFOL;  Surgeon:  Mansouraty, Netty Starring., MD;  Location: [facility];  Service: Gastroenterology;  Laterality: N/A;   FLEXIBLE SIGMOIDOSCOPY N/A 07/01/2020   Procedure: FLEXIBLE SIGMOIDOSCOPY;  Surgeon: Benancio Deeds, MD;  Location: [facility];  Service: Gastroenterology;  Laterality: N/A;   FLEXIBLE SIGMOIDOSCOPY N/A 01/27/2021   Procedure: FLEXIBLE SIGMOIDOSCOPY;  Surgeon: Benancio Deeds, MD;  Location: [facility];  Service: Gastroenterology;  Laterality: N/A;   FLEXIBLE SIGMOIDOSCOPY N/A 09/07/2021   Procedure: FLEXIBLE SIGMOIDOSCOPY;  Surgeon: Lynann Bologna, MD;  Location: [facility];  Service: Gastroenterology;  Laterality: N/A;   FLEXIBLE SIGMOIDOSCOPY N/A 11/21/2022   Procedure: FLEXIBLE SIGMOIDOSCOPY;  Surgeon: Meridee Score Netty Starring., MD;  Location: [facility];  Service: Gastroenterology;  Laterality: N/A;   LAPAROTOMY N/A 12/14/2022   Procedure: EXPLORATORY LAPAROTOMY WITH SMALL BOWEL RESECTION;  Surgeon: Moise Boring, MD;  Location: [facility];  Service: General;  Laterality: N/A;   LAPAROTOMY N/A 12/16/2022   Procedure: REOPENING LAPAROTOMY, ILEOSTOMY;  ABDOMINAL CLOSURE;  Surgeon: Moise Boring, MD;  Location: [facility];  Service: General;  Laterality: N/A;   NIPPLE SPARING MASTECTOMY Bilateral 11/09/2020   Procedure: BILATERAL NIPPLE SPARING MASTECTOMY;  Surgeon: Emelia Loron, MD;  Location: [facility];  Service: General;  Laterality: Bilateral;   PORT-A-CATH REMOVAL N/A 07/25/2021   Procedure: REMOVAL PORT-A-CATH;  Surgeon: Allena Napoleon, MD;  Location: [facility];  Service: Plastics;  Laterality: N/A;   PORTACATH PLACEMENT N/A 06/23/2020   Procedure: INSERTION PORT-A-CATH;  Surgeon: Emelia Loron, MD;  Location: [facility];  Service: General;  Laterality: N/A;  START TIME OF 11:00 AM FOR 60 MINUTES [place] [facility]   PORTACATH PLACEMENT Left 05/02/2022   Procedure: INSERTION PORT-A-CATH;  Surgeon: Emelia Loron, MD;  Location: [facility];  Service: General;  Laterality: Left;   RADIOACTIVE SEED GUIDED EXCISIONAL BREAST BIOPSY Right 05/02/2022   Procedure: RADIOACTIVE SEED GUIDED RIGHT BREAST MASS EXCISION;  Surgeon: Emelia Loron, MD;  Location: [facility];  Service: General;  Laterality: Right;   REMOVAL OF BILATERAL TISSUE EXPANDERS WITH PLACEMENT OF BILATERAL BREAST  IMPLANTS Bilateral 07/25/2021   Procedure: REMOVAL OF BILATERAL TISSUE EXPANDERS WITH PLACEMENT OF BILATERAL BREAST IMPLANTS;  Surgeon: Allena Napoleon, MD;  Location: [facility];  Service: Plastics;  Laterality: Bilateral;  1.5    Current Medications: No outpatient medications have been marked as taking for the 01/03/23 encounter (Appointment) with Maisie Fus, MD.     Allergies:   Nsaids, Chlorhexidine gluconate, Hydromorphone, and Lactose intolerance (gi)   Social History   Socioeconomic History   Marital status: Single    Spouse name: Not on file   Number of children: 1   Years of education: 13   Highest education level: Not on file  Occupational History   Occupation: Medical Coder   Tobacco Use   Smoking status: Former    Types: Cigarettes   Smokeless tobacco: Never  Vaping Use   Vaping status: Never Used  Substance and Sexual Activity   Alcohol use: Yes    Alcohol/week: 1.0 - 2.0 standard drink of alcohol    Types: 1 - 2 Glasses of wine per week   Drug use: No   Sexual activity: Yes    Partners: Male    Comment: Intermittent protection not consistent  Other Topics Concern   Not on file  Social History Narrative   Single, 1 daughter born 2001 approximately   Medical coder for [employer]   She is a former  smoker, occasional alcohol no drug use   Social Determinants of Health   Financial Resource Strain: Not on file  Food Insecurity: No Food Insecurity (11/21/2022)   Hunger Vital Sign    Worried About Running Out of Food in the Last Year: Never true    Ran Out of Food in the Last Year: Never true  Transportation Needs: No Transportation Needs (11/21/2022)   PRAPARE - Administrator, Civil Service (Medical): No    Lack of Transportation (Non-Medical): No  Physical Activity: Not on file  Stress: Not on file  Social Connections: Not on file     Family History: The patient's family history includes Breast cancer in her cousin and maternal  aunt; Diabetes in her father and paternal aunt. There is no history of Esophageal cancer, Rectal cancer, Stomach cancer, Colon polyps, or Colon cancer.  ROS:   Please see the history of present illness.     All other systems reviewed and are negative.  EKGs/Labs/Other Studies Reviewed:    The following studies were reviewed today:  TTE 06/16/20 EF 60-65%, nl RV fxn, no valve dx  TTE 10/01/2020 EF 60-65%; GLS -17%  TTE 01/12/21 EF 60-65%, GLS - 20%  TTE 04/20/2021 EF 60-65%  TTE 05/24/2021 EF 60-65%  TTE 09/18/2022 EF 55-60% Grade I DD  TTE 12/20/2022 EF 60-65% ***      Recent Labs: 12/15/2022: TSH 0.413 01/01/2023: ALT 26; Magnesium 1.9 01/02/2023: BUN 33; Creatinine, Ser 0.86; Hemoglobin 7.7; Platelets 340; Potassium 4.2; Sodium 138   Recent Lipid Panel    Component Value Date/Time   TRIG 69 01/01/2023 0456     Risk Assessment/Calculations:     Physical Exam:    VS:  LMP 04/24/2021     Wt Readings from Last 3 Encounters:  01/02/23 121 lb 0.5 oz (54.9 kg)  12/06/22 102 lb (46.3 kg)  11/23/22 102 lb 15.3 oz (46.7 kg)     GEN:  Well nourished, well developed in no acute distress HEENT: Normal CARDIAC: RRR, no murmurs, rubs, gallops RESPIRATORY:  Clear to auscultation without rales, wheezing or rhonchi  ABDOMEN: Soft, non-tender, non-distended MUSCULOSKELETAL:  No edema; No deformity  SKIN: Warm and dry NEUROLOGIC:  Alert and oriented x 3 PSYCHIATRIC:  Normal affect   ASSESSMENT:   Grade 1 DD Discussed that this can be seen with aging, not pathologic. Cancer therapy can accelerate the aging process. She has no signs of diastolic dysfunction. She is low risk for CTRCD.  Will get TTE in September PLAN:    In order of problems listed above:  TTE Limited GLS 12/2022 Follow up 3 months     Medication Adjustments/Labs and Tests Ordered: Current medicines are reviewed at length with the patient today.  Concerns regarding medicines are outlined above.  No  orders of the defined types were placed in this encounter.  No orders of the defined types were placed in this encounter.   There are no Patient Instructions on file for this visit.   Signed, Maisie Fus, MD  01/02/2023 9:27 AM    [facility]

## 2023-01-02 NOTE — Progress Notes (Signed)
Speech Language Pathology Treatment: Dysphagia  Patient Details Name: [name] MRN: [id number] DOB: 15-Apr-1975 Today's Date: 01/02/2023 Time: 0910-0920 SLP Time Calculation (min) (ACUTE ONLY): 10 min  Assessment / Plan / Recommendation Clinical Impression  Pt reporting poor intake due to lack of appetite and displeasure with food. She demonstrated lingual press exercise with great subjective strength. SLP advanced diet to dys3/nectar for meal observation in hopes to advance diet. Note Provale cup in room - which she reports has been full of tea since Friday. SLP cleaned the cup and informed RN of need to use Provale cup for nectar thick liquids. Pt declined to consume any liquid due to her discomfort - She was agreeable to advanced diet trial - and thus ordered tray and will plan for noon meal. Pt denies coughing with po intake and RN confirms. Note plan for nocturnal feeding per surgery notes. Advised RN, NT to hold tray for SLP observation.     HPI HPI: Per CCM note "47 year old female with past medical history of recurrent breast cancer not currently on treatment, Crohn's disease s/p subtotal colectomy in 2021 recurrent obstructions, colonic stricture, IDA, recent hospitalization 8/10-8/15/2024 for obstruction seen by GI and general surgery. Underwent EGD and flexible sigmoidoscopy and discharged on steroids who presented to the emergency department on 12/12/2022 with abdominal pain, nausea, vomiting diarrhea. In the ED tachycardic, hypertensive. ED provider notes abdominal distention. She was given 2L IVF, morphine, IV PPI. Labs with WBC 10.8, Hgb 10.7, K 3.2. CT AP was ordered and showed markedly dilated distal small bowel consistent with chronic obstruction. No changes since prior study. Patient was admitted to [facility]. They consulted GI for input. Unfortunately, around 0820 9/4 patient was found on the floor in the bathroom in ED pulseless. They estimate downtime around 5 minutes. ACLS was  initiated. Concern for possible torsades de pointes and given IV mag, defibrillated. Coded for around 5 minutes. She was intubated. She had copious vomitus in the ETT. ROSC achieved and ICU was consulted. Notably, she had I-stat peri-code which showed hemoglobin of 5.5 down from 10 overnight."  Pt was extubated 12/18/2021.  Pt has been maintained on TPN and is now getting trickle feeds for nutrition. SLP following for dysphagia management.  Pt dislikes puree foods per notes on 9/23.      SLP Plan  Continue with current plan of care      Recommendations for follow up therapy are one component of a multi-disciplinary discharge planning process, led by the attending physician.  Recommendations may be updated based on patient status, additional functional criteria and insurance authorization.    Recommendations  Diet recommendations: Thin liquid (trial of dys3/nectar) Liquids provided via: Cup (Provale cup) Medication Administration: Whole meds with puree Compensations: Slow rate;Small sips/bites (HOB reclined)                      None Dysphagia, pharyngeal phase (R13.13)     Continue with current plan of care    Rolena Infante, MS [facility] SLP Acute Rehab Services Office [phone]  Chales Abrahams  01/02/2023, 9:51 AM

## 2023-01-02 NOTE — Progress Notes (Signed)
ANTICOAGULATION CONSULT NOTE  Pharmacy Consult for heparin Indication: pulmonary embolus  Allergies  Allergen Reactions   Nsaids Other (See Comments)    Crohns disease/ IBD   Chlorhexidine Gluconate Itching    Able to use CHG swabs for ports, reaction with direct skin contact   Hydromorphone Other (See Comments)    Confusion/sedation   Lactose Intolerance (Gi) Other (See Comments)    Upset stomach/diarrhea    Patient Measurements: Height: 5\' 4"  (162.6 cm) Weight: 54.9 kg (121 lb 0.5 oz) IBW/kg (Calculated) : 54.7 Heparin Dosing Weight: Total body weight 54.9 kg  Vital Signs: Temp: 99.4 F (37.4 C) (09/24 0651) Temp Source: Oral (09/24 0651) BP: 108/64 (09/24 0600) Pulse Rate: 127 (09/24 0600)  Labs: Recent Labs    12/30/22 1338 12/30/22 2128 12/31/22 2335 01/01/23 0456 01/01/23 0617 01/01/23 1110 01/02/23 0517  HGB  --    < >  --  7.5*  --  7.8* 7.7*  HCT  --    < >  --  24.0*  --  24.2* 24.7*  PLT  --    < >  --  317  --  314 340  APTT 38*  --   --   --   --   --   --   LABPROT 16.0*  --   --   --   --   --   --   INR 1.3*  --   --   --   --   --   --   HEPARINUNFRC  --    < > 0.42  --  0.35  --  0.31  CREATININE  --    < >  --  1.01* 0.94  --  0.86   < > = values in this interval not displayed.    Estimated Creatinine Clearance: 71.3 mL/min (by C-G formula based on SCr of 0.86 mg/dL).   Medications:  No AC PTA.  Assessment: 47 yo F with a PMH of recurrent breast cancer not currently on treatment, who presented to the ED on 12/12/2022 with abdominal pain, nausea, vomiting diarrhea.   Patient was placed on heparin drip from 9/10-9/12 for new LLL PE. On 9/12 heparin drip was discontinued due to hemoglobin dropping and intermittent bleeding in ostomy.     On 9/21:  Pharmacy has been consulted to resume heparin drip. 9/21 Hgb: 7 >> received 1 unit PRBCs on 9/21.  Heparin drip started after transfusion on 9/21.   9/23: Paracentesis was performed with no  immediate complications. W[facility] notes reported no signs of bleeding from midline.  Today 01/02/2023 Heparin level: 0.31, therapeutic on 1700 units/hr Hgb: 7.7, Plt: 340 stable and wnl Per RN no signs of bleeding and no complications within the infusion site  Goal of Therapy:  Heparin level 0.3-0.7 units/ml Monitor platelets by anticoagulation protocol: Yes   Plan:  Continue heparin drip at 1700 units/hr Daily CBC and heparin level Monitor for signs/symptoms of bleeding  Suan Pyeatt 01/02/2023,7:21 AM

## 2023-01-02 NOTE — Progress Notes (Signed)
Tech came to let me know that patient had an episode of emesis. I entered patient room to find patient was sitting with leg hanging off bed, HR >150, oxygen saturation 74%, she was breathing heavily and stated she couldn't breathe. NRB applied and sat patient up completely in bed. Patient given prn ativan and fentanyl. Also gave scheduled lopressor. Contacted provider Chinita Greenland, NP to let him know about situation. Also contacted RT for prn breathing treatment.

## 2023-01-02 NOTE — Progress Notes (Signed)
Occupational Therapy Treatment Patient Details Name: [name] MRN: [id number] DOB: [birth year] Today's Date: 01/02/2023   History of present illness Patient is a 47 year old female who presented on 9/3 with abdominal distension and nausea/vomiting. 9/4 patient was found in ED bathroom with no pulse with ROSC in about 5 mins. Patient was intubated at this time. 9/5 patient underwent exploratory laparotomy that revealed internal hernia with associated small bowel ischemia with patient undergoing bowel resection. 9/7 patient returned to OR for ostomy placement. Patient was extubated on 9/10 and also found to have PE. PMH: breast cancer, Chron's disease, IDA, recent hospitalization 8/10 to 8/15 with bowel obstruction and EGD   OT comments  Patient participated in standing balance challenges with static standing to increase functional activity tolerance and self confidence being on feet. Patient was able to participate in functional mobility to doorway with RW with increased time and +2 for safety with lines. Patient will benefit from intensive inpatient follow up therapy, >3 hours/day. Patient would continue to benefit from skilled OT services at this time while admitted and after d/c to address noted deficits in order to improve overall safety and independence in ADLs.        If plan is discharge home, recommend the following:  Two people to help with walking and/or transfers;Two people to help with bathing/dressing/bathroom;Direct supervision/assist for medications management;Assistance with cooking/housework;Assist for transportation;Help with stairs or ramp for entrance;Direct supervision/assist for financial management   Equipment Recommendations  None recommended by OT       Precautions / Restrictions Precautions Precautions: Fall Precaution Comments: abdominal precautions, wound vac, abominal binder, ostomy, nasal tube Restrictions Weight Bearing Restrictions: No       Mobility  Bed Mobility Overal bed mobility: Needs Assistance Bed Mobility: Supine to Sit     Supine to sit: Mod assist, Used rails, HOB elevated     General bed mobility comments: assist to raise trunk and pivot hips to EOB, VCs for technique         Balance Overall balance assessment: Needs assistance Sitting-balance support: Bilateral upper extremity supported, Feet supported Sitting balance-Leahy Scale: Fair Sitting balance - Comments: UE support   Standing balance support: Reliant on assistive device for balance, Single extremity supported, During functional activity Standing balance-Leahy Scale: Poor       ADL either performed or assessed with clinical judgement   ADL Overall ADL's : Needs assistance/impaired       Grooming Details (indicate cue type and reason): patient declined to participate in grooming tasks reporting too fatigued from working on functional mobility       Toilet Transfer Details (indicate cue type and reason): patient was able to stand from over 2 mins in stedy with challanges to balance with one UE support and weight shifting. patient then able to progress to RW with taking functional steps towards door to increase independence in toielting transfers. patient needs +2 with all lines and leads. patient was encouraged and congratulated on increased movement today.   Toileting - Clothing Manipulation Details (indicate cue type and reason): simulated reaching task standing with one UE support with no LOB              Cognition Arousal: Alert Behavior During Therapy: WFL for tasks assessed/performed Overall Cognitive Status: Within Functional Limits for tasks assessed                       Pertinent Vitals/ Pain  Pain Assessment Pain Assessment: No/denies pain         Frequency  Min 2X/week        Progress Toward Goals  OT Goals(current goals can now be found in the care plan section)  Progress towards OT goals: Progressing  toward goals     Plan      Co-evaluation    PT/OT/SLP Co-Evaluation/Treatment: Yes Reason for Co-Treatment: For patient/therapist safety;To address functional/ADL transfers PT goals addressed during session: Mobility/safety with mobility;Proper use of DME OT goals addressed during session: ADL's and self-care      AM-PAC OT "6 Clicks" Daily Activity     Outcome Measure   Help from another person eating meals?: Total (NPO) Help from another person taking care of personal grooming?: A Lot Help from another person toileting, which includes using toliet, bedpan, or urinal?: A Lot Help from another person bathing (including washing, rinsing, drying)?: A Lot Help from another person to put on and taking off regular upper body clothing?: A Lot Help from another person to put on and taking off regular lower body clothing?: Total 6 Click Score: 10    End of Session Equipment Utilized During Treatment: Rolling walker (2 wheels)  OT Visit Diagnosis: Unsteadiness on feet (R26.81);Other abnormalities of gait and mobility (R26.89);Pain   Activity Tolerance Patient limited by pain   Patient Left with call bell/phone within reach;in chair;with chair alarm set   Nurse Communication Mobility status        Time: 3664-4034 OT Time Calculation (min): 24 min  Charges: OT General Charges $OT Visit: 1 Visit OT Treatments $Therapeutic Activity: 8-22 mins  Rosalio Loud, MS Acute Rehabilitation Department Office# [phone]   Selinda Flavin 01/02/2023, 3:23 PM

## 2023-01-02 NOTE — Plan of Care (Signed)
  Problem: Education: Goal: Ability to describe self-care measures that may prevent or decrease complications (Diabetes Survival Skills Education) will improve Outcome: Progressing Goal: Individualized Educational Video(s) Outcome: Progressing   Problem: Coping: Goal: Ability to adjust to condition or change in health will improve Outcome: Progressing   Problem: Fluid Volume: Goal: Ability to maintain a balanced intake and output will improve Outcome: Progressing   Problem: Health Behavior/Discharge Planning: Goal: Ability to identify and utilize available resources and services will improve Outcome: Progressing Goal: Ability to manage health-related needs will improve Outcome: Progressing   Problem: Metabolic: Goal: Ability to maintain appropriate glucose levels will improve Outcome: Progressing   Problem: Nutritional: Goal: Maintenance of adequate nutrition will improve Outcome: Progressing   Problem: Tissue Perfusion: Goal: Adequacy of tissue perfusion will improve Outcome: Progressing   Problem: Education: Goal: Knowledge of General Education information will improve Description: Including pain rating scale, medication(s)/side effects and non-pharmacologic comfort measures Outcome: Progressing   Problem: Health Behavior/Discharge Planning: Goal: Ability to manage health-related needs will improve Outcome: Progressing   Problem: Clinical Measurements: Goal: Ability to maintain clinical measurements within normal limits will improve Outcome: Progressing Goal: Diagnostic test results will improve Outcome: Progressing Goal: Respiratory complications will improve Outcome: Progressing Goal: Cardiovascular complication will be avoided Outcome: Progressing   Problem: Activity: Goal: Risk for activity intolerance will decrease Outcome: Progressing   Problem: Nutrition: Goal: Adequate nutrition will be maintained Outcome: Progressing   Problem: Elimination: Goal:  Will not experience complications related to bowel motility Outcome: Progressing Goal: Will not experience complications related to urinary retention Outcome: Progressing   Problem: Safety: Goal: Ability to remain free from injury will improve Outcome: Progressing   Problem: Skin Integrity: Goal: Risk for impaired skin integrity will decrease Outcome: Progressing   Problem: Nutritional: Goal: Progress toward achieving an optimal weight will improve Outcome: Not Progressing   Problem: Skin Integrity: Goal: Risk for impaired skin integrity will decrease Outcome: Not Progressing   Problem: Clinical Measurements: Goal: Will remain free from infection Outcome: Not Progressing   Problem: Coping: Goal: Level of anxiety will decrease Outcome: Not Progressing   Problem: Pain Managment: Goal: General experience of comfort will improve Outcome: Not Progressing

## 2023-01-02 NOTE — Progress Notes (Signed)
Speech Language Pathology Treatment: Dysphagia  Patient Details Name: [name] MRN: [id number] DOB: Dec 04, 1975 Today's Date: 01/02/2023 Time: 1212-1250 SLP Time Calculation (min) (ACUTE ONLY): 38 min  Assessment / Plan / Recommendation Clinical Impression  Patient seen for skilled SLP to observe lunch p.o. with salmon, rice, carrots, nectar thick water and nectar thick tea consumed. [name] is very cautious stating she is afraid, demonstrating excellent comprehension of concern for inability to clear if she does aspirate. Her caution encourages SLP to proceed with dietary advancement. No clinical indication of dysphagia or aspiration across all p.o. She consumed approximately 4 bites of rice, 3 bites of carrots and 3 bites of salmon along with approximately 2 ounces of liquids. Intake is likely impacted by her lack of appetite as well as her caution. At this time will continue diet advancement. Patient denies lingual pain but does appear with minimal white coating.    HPI HPI: Per CCM note "47 year old female with past medical history of recurrent breast cancer not currently on treatment, Crohn's disease s/p subtotal colectomy in 2021 recurrent obstructions, colonic stricture, IDA, recent hospitalization 8/10-8/15/2024 for obstruction seen by GI and general surgery. Underwent EGD and flexible sigmoidoscopy and discharged on steroids who presented to the emergency department on 12/12/2022 with abdominal pain, nausea, vomiting diarrhea. In the ED tachycardic, hypertensive. ED provider notes abdominal distention. She was given 2L IVF, morphine, IV PPI. Labs with WBC 10.8, Hgb 10.7, K 3.2. CT AP was ordered and showed markedly dilated distal small bowel consistent with chronic obstruction. No changes since prior study. Patient was admitted to [facility]. They consulted GI for input. Unfortunately, around 0820 9/4 patient was found on the floor in the bathroom in ED pulseless. They estimate downtime around 5  minutes. ACLS was initiated. Concern for possible torsades de pointes and given IV mag, defibrillated. Coded for around 5 minutes. She was intubated. She had copious vomitus in the ETT. ROSC achieved and ICU was consulted. Notably, she had I-stat peri-code which showed hemoglobin of 5.5 down from 10 overnight."  Pt was extubated 12/18/2021.  Pt has been maintained on TPN and is now getting trickle feeds for nutrition. SLP following for dysphagia management.  Pt dislikes puree foods per notes on 9/23.    Pt denies odynophagia - and reports she has performed oral care, will monitor closely.      SLP Plan  Continue with current plan of care      Recommendations for follow up therapy are one component of a multi-disciplinary discharge planning process, led by the attending physician.  Recommendations may be updated based on patient status, additional functional criteria and insurance authorization.    Recommendations  Diet recommendations: Nectar-thick liquid;Dysphagia 3 (mechanical soft) Liquids provided via: Teaspoon Medication Administration: Via alternative means Supervision: Patient able to self feed Compensations: Slow rate;Small sips/bites;Other (Comment) (HOB 45*) Postural Changes and/or Swallow Maneuvers: Seated upright 90 degrees;Upright 30-60 min after meal                      Frequent or constant Supervision/Assistance Dysphagia, oropharyngeal phase (R13.12)     Continue with current plan of care    Courtney Infante, MS [facility] SLP Acute Rehab Services Office [phone]  [name]  01/02/2023, 12:58 PM

## 2023-01-02 NOTE — Progress Notes (Signed)
Progress Note  17 Days Post-Op  Subjective: Ongoing fevers. No new complaints - denies abdominal pain currently.   Objective: Vital signs in last 24 hours: Temp:  [98.6 F (37 C)-101.3 F (38.5 C)] 101.1 F (38.4 C) (09/24 0725) Pulse Rate:  [81-134] 129 (09/24 0725) Resp:  [15-25] 20 (09/24 0725) BP: (86-124)/(55-82) 113/65 (09/24 0700) SpO2:  [96 %-100 %] 99 % (09/24 0725) Weight:  [54.9 kg] 54.9 kg (09/24 0500) Last BM Date : 01/01/23  Intake/Output from previous day: 09/23 0701 - 09/24 0700 In: 2750.9 [I.V.:1897.2; NG/GT:150; IV Piggyback:703.7] Out: 3720 [Urine:2300; Emesis/NG output:120; Stool:1300] Intake/Output this shift: No intake/output data recorded.  PE: Gen:  Alert, pleasant Card:  Tachycardic 130's Pulm: Rate and effort normal Abd: Soft, mild distension, NT today. No rigidity or guarding on exam. stomy pink, budded and viable with thin green liquid in ostomy bag. Midline wound with VAC in place holding suction GU: Foley draining straw colored urine.    Lab Results:  Recent Labs    01/01/23 1110 01/02/23 0517  WBC 18.1* 18.6*  HGB 7.8* 7.7*  HCT 24.2* 24.7*  PLT 314 340   BMET Recent Labs    01/01/23 0617 01/02/23 0517  NA 133* 138  K 3.8 4.2  CL 101 103  CO2 27 26  GLUCOSE 121* 121*  BUN 34* 33*  CREATININE 0.94 0.86  CALCIUM 7.2* 8.0*   PT/INR Recent Labs    12/30/22 1338  LABPROT 16.0*  INR 1.3*   CMP     Component Value Date/Time   NA 138 01/02/2023 0517   NA 135 01/21/2020 1056   K 4.2 01/02/2023 0517   CL 103 01/02/2023 0517   CO2 26 01/02/2023 0517   GLUCOSE 121 (H) 01/02/2023 0517   BUN 33 (H) 01/02/2023 0517   BUN 12 01/21/2020 1056   CREATININE 0.86 01/02/2023 0517   CREATININE 0.91 09/21/2022 1141   CALCIUM 8.0 (L) 01/02/2023 0517   PROT 5.6 (L) 01/01/2023 0617   ALBUMIN <1.5 (L) 01/01/2023 0617   AST 32 01/01/2023 0617   AST 18 09/21/2022 1141   ALT 26 01/01/2023 0617   ALT 12 09/21/2022 1141   ALKPHOS  148 (H) 01/01/2023 0617   BILITOT 0.4 01/01/2023 0617   BILITOT 0.4 09/21/2022 1141   GFRNONAA >60 01/02/2023 0517   GFRNONAA >60 09/21/2022 1141   GFRAA 108 01/21/2020 1056   Lipase     Component Value Date/Time   LIPASE 762 (H) 12/21/2022 0305       Studies/Results: US Paracentesis  Result Date: 01/01/2023 INDICATION: Patient with history of Crohn's disease with prior subtotal colectomy 2021, small-bowel resection on 12/14/2022, ileostomy on 12/16/2022, metastatic breast cancer, ascites. Request received for diagnostic and therapeutic paracentesis. EXAM: ULTRASOUND GUIDED DIAGNOSTIC AND THERAPEUTIC PARACENTESIS MEDICATIONS: 8 mL 1% lidocaine COMPLICATIONS: None immediate. PROCEDURE: Informed written consent was obtained from the patient after a discussion of the risks, benefits and alternatives to treatment. A timeout was performed prior to the initiation of the procedure. Initial ultrasound scanning demonstrates a small amount of ascites within the right lower abdominal quadrant. The right lower abdomen was prepped and draped in the usual sterile fashion. 1% lidocaine was used for local anesthesia. Following this, a 19 gauge, 7-cm, Yueh catheter was introduced. An ultrasound image was saved for documentation purposes. The paracentesis was performed. The catheter was removed and a dressing was applied. The patient tolerated the procedure well without immediate post procedural complication. FINDINGS: A total of approximately 90  cc of turbid, amber/blood-tinged fluid was removed. Samples were sent to the laboratory as requested by the clinical team. Due to significant loculations and despite catheter manipulation only the above amount of fluid could be removed today. IMPRESSION: Successful ultrasound-guided diagnostic and therapeutic paracentesis yielding 90 cc of peritoneal fluid. Performed by: Artemio Aly Electronically Signed   By: Malachy Moan M.D.   On: 01/01/2023 13:16   CT  ABDOMEN PELVIS W CONTRAST  Result Date: 12/31/2022 CLINICAL DATA:  Postoperative abdominal pain. EXAM: CT ABDOMEN AND PELVIS WITH CONTRAST TECHNIQUE: Multidetector CT imaging of the abdomen and pelvis was performed using the standard protocol following bolus administration of intravenous contrast. RADIATION DOSE REDUCTION: This exam was performed according to the departmental dose-optimization program which includes automated exposure control, adjustment of the mA and/or kV according to patient size and/or use of iterative reconstruction technique. CONTRAST:  OMNIPAQUE IOHEXOL 300 MG/ML  SOLN COMPARISON:  CT of the abdomen and pelvis without contrast 12/25/2022 and 12/13/2022. FINDINGS: Lower chest: Moderate right-sided pleural effusion is again seen. Left pleural effusion is improved. Focal airspace consolidation is present at the left base. The airways are patent. Heart size is normal. No significant pericardial effusion is present. Left breast implant noted. Hepatobiliary: No focal liver abnormality is seen. No gallstones, gallbladder wall thickening, or biliary dilatation. Pancreas: Mild dilation of the pancreatic duct noted. No obstructing lesion is present. Spleen: Normal in size without focal abnormality. Adrenals/Urinary Tract: The adrenal glands are normal bilaterally. Kidneys are unremarkable. No stone or mass lesion is present. No obstruction is present. A Foley catheter is present within the urinary bladder. Stomach/Bowel: The small bore feeding tube is in place. The tip is in the distal duodenum. Dilated loops of proximal small bowel measure up to 3.7 cm. Just distal to the dilated segment marked small bowel wall thickening is present which continues to the level of the left lower quadrant ileostomy. Extensive small bowel resection and partial colectomy noted. Vascular/Lymphatic: The aorta is unremarkable. Subcentimeter periaortic lymph nodes are similar the prior exam, likely reactive. Are.  Reproductive: Fibroid uterus is again noted.  The adnexa Other: Diffuse peritoneal thickening is now present. Locules of gas are present in the right lower quadrant. A small amount of free air remains. Musculoskeletal: The vertebral body heights and alignment are normal. No focal osseous abnormality is present. IMPRESSION: 1. Dilated loops of proximal small bowel measure up to 3.7 cm. Just distal to the dilated segment marked small bowel wall thickening is marked small bowel wall thickening which continues to the level of the left lower quadrant ileostomy. This raises concern for early developing obstruction. Findings are also concerning for bowel ischemia in this patient previous ischemic bowel. 2. Diffuse peritoneal thickening is concerning for peritonitis. 3. Persistent pneumoperitoneum is unexpected 15 days after surgery, raising concern infection. 4. Moderate right-sided pleural effusion is again seen. Left pleural effusion is improved. 5. Focal airspace consolidation at the left base is concerning for pneumonia. 6. Fibroid uterus. These results were called by telephone at the time of interpretation on 12/31/2022 at 11:04 am to provider [facility] , who verbally acknowledged these results. Electronically Signed   By: Marin Roberts M.D.   On: 12/31/2022 11:19    Anti-infectives: Anti-infectives (From admission, onward)    Start     Dose/Rate Route Frequency Ordered Stop   12/29/22 1215  ciprofloxacin (CIPRO) IVPB 400 mg        400 mg 200 mL/hr over 60 Minutes Intravenous 2 times  daily 12/29/22 1123     12/29/22 1215  metroNIDAZOLE (FLAGYL) IVPB 500 mg        500 mg 100 mL/hr over 60 Minutes Intravenous 2 times daily 12/29/22 1123     12/28/22 1430  micafungin (MYCAMINE) 100 mg in sodium chloride 0.9 % 100 mL IVPB        100 mg 105 mL/hr over 1 Hours Intravenous Every 24 hours 12/28/22 1335     12/23/22 2000  vancomycin (VANCOREADY) IVPB 1250 mg/250 mL  Status:  Discontinued        1,250  mg 166.7 mL/hr over 90 Minutes Intravenous Every 36 hours 12/22/22 0612 12/22/22 1103   12/22/22 0645  vancomycin (VANCOREADY) IVPB 1250 mg/250 mL        1,250 mg 166.7 mL/hr over 90 Minutes Intravenous  Once 12/22/22 0551 12/22/22 0805   12/22/22 0600  piperacillin-tazobactam (ZOSYN) IVPB 3.375 g  Status:  Discontinued        3.375 g 12.5 mL/hr over 240 Minutes Intravenous Every 8 hours 12/22/22 0551 12/29/22 1123   12/13/22 1000  piperacillin-tazobactam (ZOSYN) IVPB 3.375 g  Status:  Discontinued        3.375 g 12.5 mL/hr over 240 Minutes Intravenous Every 8 hours 12/13/22 0833 12/20/22 1333        Assessment/Plan  Hx Crohn's disease s/p subtotal colectomy  Internal hernia with bowel ischemia - POD 19 exploratory laparotomy with small bowel resection 9/5 Dr. Hillery Hunter - POD 17  s/p exploratory laparotomy, ileostomy, abdominal closure 9/7 Dr. 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 Highly likely to have high ostomy output  - Cont TPN. Will likely need TPN at d/c. Pre-alb 11 and Albumin < 1.5 on 9/19 - Imodium 4mg  QID, Iron 325mg  BID, Fibercon 625mg   increased to TID. Added lomotil 9/23 as well. - On trickle TF's. Advance to 30 mL/hr and see how she tolerates/monitor ostomy output. If tolerates then consider making TF supplementation nocturnal and eating PO only during the day. - Diet per SLP. Currenlty on Nectar-thick liquid;Dysphagia 1 (puree) - WOCN following for new ostomy. Plan ostomy clinic referral at d/c - Midline wound with hx of bleeding leading. Resolved. Cont VAC M/W/F with white foam in base inferiorly  - CT A/P 9/16 done. No indication for return to the OR. Low suspicion for bowel ischemia currently. Large amount of ascites. S/p IR paracentesis 9/17 with 500cc of clear yellow fluid. Cx pending. No growth to date. IR repeat para 9/23 > turbid amber blood tinged fluid (no organisms, NGTD) .   - ABx and anti-fungals  per ID - PT/OT - Palliative following peripherally currently.   ID - Zosyn 9/4>>9/11. 9/13 >> Micafungin 9/19 >> Patient still febrile with elevated leukocytosis.  LE [place] 9/18 neg. UE [place] 9/19 neg. UA neg. CXR w/ L > R opacities with b/l pleural effusions. Defer to [facility] if pleural effusions need further addressing. Resp Cx ordered. Bcx including specific fungal blood cx NGTD.    FEN - D1 per SLP as above. advance TF today, consider nocturnal feeds if tolerates. TPN VTE - SCDs, Hep gtt Foley - Failed TOV. Foley in place with good UOP.     - per [facility] - PE - Noted on CTA 9/10. LE [place] negative for DVT. Heparin gtt. Hgb 7.5 from 8.6, agree with PM CBC. Agree with CCM that we may need to consider IVC temporary filter at some point if unable to restart  heparin gtt (hx breast ca, recent surgery, immobilized). Shock - off pressors VDRF - extubated  Recent cardiac arrest 9/3 AKI - Cr stable on most recent labs Anemia - Transfuse to keep hemoglobin above 7. Consider IV iron supplementation - she has short gut so worried she will not absorb iron well orally. Last PT-INR 9/15 was 1.3.  Hx metastatic breast cancer   LOS: 20 days     Adam Phenix, [facility] Surgery 01/02/2023, 8:02 AM Please see Amion for pager number during day hours 7:00am-4:30pm

## 2023-01-03 ENCOUNTER — Inpatient Hospital Stay (HOSPITAL_COMMUNITY): Payer: Managed Care, Other (non HMO)

## 2023-01-03 ENCOUNTER — Ambulatory Visit: Payer: Managed Care, Other (non HMO) | Attending: Internal Medicine | Admitting: Internal Medicine

## 2023-01-03 DIAGNOSIS — D72829 Elevated white blood cell count, unspecified: Secondary | ICD-10-CM

## 2023-01-03 DIAGNOSIS — R509 Fever, unspecified: Secondary | ICD-10-CM

## 2023-01-03 DIAGNOSIS — K90829 Short bowel syndrome, unspecified: Secondary | ICD-10-CM | POA: Diagnosis not present

## 2023-01-03 LAB — GLUCOSE, CAPILLARY
Glucose-Capillary: 100 mg/dL — ABNORMAL HIGH (ref 70–99)
Glucose-Capillary: 113 mg/dL — ABNORMAL HIGH (ref 70–99)
Glucose-Capillary: 116 mg/dL — ABNORMAL HIGH (ref 70–99)
Glucose-Capillary: 142 mg/dL — ABNORMAL HIGH (ref 70–99)
Glucose-Capillary: 173 mg/dL — ABNORMAL HIGH (ref 70–99)

## 2023-01-03 LAB — CBC
HCT: 24.3 % — ABNORMAL LOW (ref 36.0–46.0)
Hemoglobin: 7.6 g/dL — ABNORMAL LOW (ref 12.0–15.0)
MCH: 28.4 pg (ref 26.0–34.0)
MCHC: 31.3 g/dL (ref 30.0–36.0)
MCV: 90.7 fL (ref 80.0–100.0)
Platelets: 348 10*3/uL (ref 150–400)
RBC: 2.68 MIL/uL — ABNORMAL LOW (ref 3.87–5.11)
RDW: 19 % — ABNORMAL HIGH (ref 11.5–15.5)
WBC: 20.1 10*3/uL — ABNORMAL HIGH (ref 4.0–10.5)
nRBC: 0 % (ref 0.0–0.2)

## 2023-01-03 LAB — FUNGUS CULTURE, BLOOD
Culture: NO GROWTH
Special Requests: ADEQUATE

## 2023-01-03 LAB — HEPARIN LEVEL (UNFRACTIONATED)
Heparin Unfractionated: 0.23 IU/mL — ABNORMAL LOW (ref 0.30–0.70)
Heparin Unfractionated: 0.46 IU/mL (ref 0.30–0.70)

## 2023-01-03 LAB — CALCIUM, IONIZED: Calcium, Ionized, Serum: 4.1 mg/dL — ABNORMAL LOW (ref 4.5–5.6)

## 2023-01-03 LAB — LACTIC ACID, PLASMA: Lactic Acid, Venous: 1.2 mmol/L (ref 0.5–1.9)

## 2023-01-03 MED ORDER — TRAVASOL 10 % IV SOLN
INTRAVENOUS | Status: AC
  Filled 2023-01-03: qty 940.8

## 2023-01-03 MED ORDER — ACETAMINOPHEN 325 MG PO TABS
650.0000 mg | ORAL_TABLET | Freq: Four times a day (QID) | ORAL | Status: DC | PRN
  Administered 2023-01-03 (×2): 650 mg
  Filled 2023-01-03 (×3): qty 2

## 2023-01-03 MED ORDER — HEPARIN (PORCINE) 25000 UT/250ML-% IV SOLN
2000.0000 [IU]/h | INTRAVENOUS | Status: AC
  Administered 2023-01-03 – 2023-01-04 (×2): 1850 [IU]/h via INTRAVENOUS
  Filled 2023-01-03 (×2): qty 250

## 2023-01-03 MED ORDER — SODIUM CHLORIDE 0.9 % IV BOLUS
1000.0000 mL | Freq: Once | INTRAVENOUS | Status: AC
  Administered 2023-01-03: 1000 mL via INTRAVENOUS

## 2023-01-03 NOTE — Progress Notes (Signed)
Speech Language Pathology Treatment: Dysphagia  Patient Details Name: [name] MRN: [id number] DOB: [birth year] Today's Date: 01/03/2023 Time: 4098-1191 SLP Time Calculation (min) (ACUTE ONLY): 14 min  Assessment / Plan / Recommendation Clinical Impression  Pt seen to assess po tolerance and to continue SLP therapy.  Upon arrival to room, pt working on her meal tray - she had mashed potatoes and green beans *as well as [place] that she can not eat*.  No clinical indication of aspiration with po observed.  She continues to use caution and her intake remains poor.  SLP offered pt [place] ice - which she declined to try at this time.   Reviewed today's CXR report.    Assessment of cough and throat clearing strength showed improvement and pt able to obtain phonation with /ah/ sound thankfully! She stated she was so happy that she "may cry".     Advised she continue to work on Chief Technology Officer and phonation of /ah/ strength. Will plan repeat MBS as phonation and cough strength continues to improve OR prior to dc hospital.   Using teach back, pt agreeable to above recommendations and SLP posted sign in room to remind her of exercises.   In addition, SLP went through a menu and crossed out foods that she will not be able to tolerate at this time due to her dypsphagia - most importantly meats. Requested she take picture of  modified menu and send to her daughter - so her daughter can order foods that she may tolerate.   Hopeful for improved intake as pt TF is stopped - and improved swallowing as pt continues to medically progress with improved voicing/cough strength.      HPI HPI: Per CCM note "47 year old female with past medical history of recurrent breast cancer not currently on treatment, Crohn's disease s/p subtotal colectomy in 2021 recurrent obstructions, colonic stricture, IDA, recent hospitalization 8/10-8/15/2024 for obstruction seen by GI and general surgery. Underwent EGD and  flexible sigmoidoscopy and discharged on steroids who presented to the emergency department on 12/12/2022 with abdominal pain, nausea, vomiting diarrhea. In the ED tachycardic, hypertensive. ED provider notes abdominal distention. She was given 2L IVF, morphine, IV PPI. Labs with WBC 10.8, Hgb 10.7, K 3.2. CT AP was ordered and showed markedly dilated distal small bowel consistent with chronic obstruction. 12/13/2022 patient was found on the floor in the bathroom in ED pulseless. They estimate downtime around 5 minutes. ACLS was initiated. Concern for possible torsades de pointes and given IV mag, defibrillated. Coded for around 5 minutes. She was intubated. She had copious vomitus in the ETT. ROSC achieved and ICU was consulted. Notably, she had I-stat peri-code which showed hemoglobin of 5.5 down from 10 overnight."  Pt was extubated 12/18/2021.  Pt has been maintained on TPN and is now getting trickle feeds for nutrition. SLP following for dysphagia management.  Pt dislikes puree foods per notes on 9/23 and diet was advanced to mechanical soft on 9/24.  Intake remains compromised as pt reports feeling full due to overnight feeds - therefore tube feeding stopped per surgery.  She is continuing to have fevers but denies worsening pain - ID on board.  Pt has remained aphonic since intubation 9/4 - and extubation on 9/10.      SLP Plan  Continue with current plan of care      Recommendations for follow up therapy are one component of a multi-disciplinary discharge planning process, led by the attending physician.  Recommendations may be updated  based on patient status, additional functional criteria and insurance authorization.    Recommendations  Diet recommendations: Dysphagia 3 (mechanical soft);Nectar-thick liquid Liquids provided via: Cup;Teaspoon (provale cup) Medication Administration: Other (Comment) (may try medication via po with applesauce - whole if small) Supervision: Patient able to self  feed Compensations: Slow rate;Small sips/bites Postural Changes and/or Swallow Maneuvers:  (HOB no higher than 45*)                  Oral care BID     Dysphagia, oropharyngeal phase (R13.12)     Continue with current plan of care    Courtney Infante, MS [facility] SLP Acute Rehab Services Office [phone]  Chales Abrahams  01/03/2023, 6:33 PM

## 2023-01-03 NOTE — Progress Notes (Addendum)
Progress Note  18 Days Post-Op  Subjective: Ongoing fevers. No new complaints - denies abdominal pain currently. Reports one episode of emesis at 0930 PM last night. No nausea/vomiting this AM. Pt tells me that she is pulling  ~750 mL on IS. Bedside RN tells me patient was placed on NRB overnight. Now off and ORA. Objective: Vital signs in last 24 hours: Temp:  [98.2 F (36.8 C)-102.2 F (39 C)] 99.7 F (37.6 C) (09/25 1000) Pulse Rate:  [98-152] 123 (09/25 1007) Resp:  [14-43] 16 (09/25 1000) BP: (88-144)/(52-85) 105/64 (09/25 1007) SpO2:  [74 %-100 %] 100 % (09/25 1000) Weight:  [59.1 kg] 59.1 kg (09/25 0500) Last BM Date : 01/02/23  Intake/Output from previous day: 09/24 0701 - 09/25 0700 In: 2425.7 [P.O.:50; I.V.:1500.3; NG/GT:200; IV Piggyback:675.4] Out: 3650 [Urine:2375; Emesis/NG output:50; Drains:25; Stool:1200] Intake/Output this shift: Total I/O In: -  Out: 700 [Urine:300; Stool:400]  PE: Gen:  Alert, pleasant Card:  Tachycardic 120's Pulm: Rate and effort normal Abd: Soft, overall nondistended, LLQ ileostomy viable, to gravity. 1200 mL/24h Midline incision:   GU: Foley draining straw colored urine.    Lab Results:  Recent Labs    01/02/23 0517 01/03/23 0632  WBC 18.6* 20.1*  HGB 7.7* 7.6*  HCT 24.7* 24.3*  PLT 340 348   BMET Recent Labs    01/01/23 0617 01/02/23 0517  NA 133* 138  K 3.8 4.2  CL 101 103  CO2 27 26  GLUCOSE 121* 121*  BUN 34* 33*  CREATININE 0.94 0.86  CALCIUM 7.2* 8.0*   PT/INR No results for input(s): "LABPROT", "INR" in the last 72 hours.  CMP     Component Value Date/Time   NA 138 01/02/2023 0517   NA 135 01/21/2020 1056   K 4.2 01/02/2023 0517   CL 103 01/02/2023 0517   CO2 26 01/02/2023 0517   GLUCOSE 121 (H) 01/02/2023 0517   BUN 33 (H) 01/02/2023 0517   BUN 12 01/21/2020 1056   CREATININE 0.86 01/02/2023 0517   CREATININE 0.91 09/21/2022 1141   CALCIUM 8.0 (L) 01/02/2023 0517   PROT 5.6 (L)  01/01/2023 0617   ALBUMIN <1.5 (L) 01/01/2023 0617   AST 32 01/01/2023 0617   AST 18 09/21/2022 1141   ALT 26 01/01/2023 0617   ALT 12 09/21/2022 1141   ALKPHOS 148 (H) 01/01/2023 0617   BILITOT 0.4 01/01/2023 0617   BILITOT 0.4 09/21/2022 1141   GFRNONAA >60 01/02/2023 0517   GFRNONAA >60 09/21/2022 1141   GFRAA 108 01/21/2020 1056   Lipase     Component Value Date/Time   LIPASE 762 (H) 12/21/2022 0305       Studies/Results: US Paracentesis  Result Date: 01/01/2023 INDICATION: Patient with history of Crohn's disease with prior subtotal colectomy 2021, small-bowel resection on 12/14/2022, ileostomy on 12/16/2022, metastatic breast cancer, ascites. Request received for diagnostic and therapeutic paracentesis. EXAM: ULTRASOUND GUIDED DIAGNOSTIC AND THERAPEUTIC PARACENTESIS MEDICATIONS: 8 mL 1% lidocaine COMPLICATIONS: None immediate. PROCEDURE: Informed written consent was obtained from the patient after a discussion of the risks, benefits and alternatives to treatment. A timeout was performed prior to the initiation of the procedure. Initial ultrasound scanning demonstrates a small amount of ascites within the right lower abdominal quadrant. The right lower abdomen was prepped and draped in the usual sterile fashion. 1% lidocaine was used for local anesthesia. Following this, a 19 gauge, 7-cm, Yueh catheter was introduced. An ultrasound image was saved for documentation purposes. The paracentesis was performed. The  catheter was removed and a dressing was applied. The patient tolerated the procedure well without immediate post procedural complication. FINDINGS: A total of approximately 90 cc of turbid, amber/blood-tinged fluid was removed. Samples were sent to the laboratory as requested by the clinical team. Due to significant loculations and despite catheter manipulation only the above amount of fluid could be removed today. IMPRESSION: Successful ultrasound-guided diagnostic and therapeutic  paracentesis yielding 90 cc of peritoneal fluid. Performed by: Artemio Aly Electronically Signed   By: Malachy Moan M.D.   On: 01/01/2023 13:16    Anti-infectives: Anti-infectives (From admission, onward)    Start     Dose/Rate Route Frequency Ordered Stop   12/29/22 1215  ciprofloxacin (CIPRO) IVPB 400 mg        400 mg 200 mL/hr over 60 Minutes Intravenous 2 times daily 12/29/22 1123     12/29/22 1215  metroNIDAZOLE (FLAGYL) IVPB 500 mg        500 mg 100 mL/hr over 60 Minutes Intravenous 2 times daily 12/29/22 1123     12/28/22 1430  micafungin (MYCAMINE) 100 mg in sodium chloride 0.9 % 100 mL IVPB        100 mg 105 mL/hr over 1 Hours Intravenous Every 24 hours 12/28/22 1335     12/23/22 2000  vancomycin (VANCOREADY) IVPB 1250 mg/250 mL  Status:  Discontinued        1,250 mg 166.7 mL/hr over 90 Minutes Intravenous Every 36 hours 12/22/22 0612 12/22/22 1103   12/22/22 0645  vancomycin (VANCOREADY) IVPB 1250 mg/250 mL        1,250 mg 166.7 mL/hr over 90 Minutes Intravenous  Once 12/22/22 0551 12/22/22 0805   12/22/22 0600  piperacillin-tazobactam (ZOSYN) IVPB 3.375 g  Status:  Discontinued        3.375 g 12.5 mL/hr over 240 Minutes Intravenous Every 8 hours 12/22/22 0551 12/29/22 1123   12/13/22 1000  piperacillin-tazobactam (ZOSYN) IVPB 3.375 g  Status:  Discontinued        3.375 g 12.5 mL/hr over 240 Minutes Intravenous Every 8 hours 12/13/22 0160 12/20/22 1333        Assessment/Plan  Hx Crohn's disease s/p subtotal colectomy  Internal hernia with bowel ischemia - POD 20 exploratory laparotomy with small bowel resection 9/5 Dr. Hillery Hunter - POD 18  s/p exploratory laparotomy, ileostomy, abdominal closure 9/7 Dr. 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 Highly likely to have high ostomy output  - Cont TPN. Will likely need TPN at d/c. Pre-alb 11 and Albumin < 1.5 on 9/19 - Imodium 4mg  QID, Iron 325mg  BID,  Fibercon 625mg   increased to TID. Added lomotil 9/23 as well. - On nocturnal TF at 30 mL/hr; will discuss long-term enteral nutrition plan with MD. Query if we should just stop TF and attempt DYS3 diet. - Diet per SLP. DYS3 - poor PO intake at current, PO supplements being given per tube.  - WOCN following for new ostomy. Plan ostomy clinic referral at d/c - Midline wound with hx of bleeding. Resolved. Cont VAC M/W/F with white foam in base inferiorly. Healing well. - CT A/P 9/16 done. No indication for return to the OR. Low suspicion for bowel ischemia currently. Large amount of ascites. S/p IR paracentesis 9/17 with 500cc of clear yellow fluid. Cx NGTD.. IR repeat para 9/23 > turbid amber blood tinged fluid (no organisms, NGTD) .   - ABx and anti-fungals per ID - PT/OT - Palliative following  peripherally currently.   Ongoing fever of unclear etiology. Low suspicion for abdominal source. CXR today looks ok - left pleural effusion but no large infiltrate based on my read. No recent blood or urine culture -- port and central line sites are without erythema. Defer repeat cultures to primary team.   ID - Zosyn 9/4>>9/11. 9/13 >> Micafungin 9/19 >> Patient still febrile with elevated leukocytosis.  LE [place] 9/18 neg. UE [place] 9/19 neg. UA neg. CXR w/ L > R opacities with b/l pleural effusions. Defer to [facility] if pleural effusions need further addressing. Resp Cx ordered. Bcx including specific fungal blood cx NGTD.    FEN - DYS 3 per SLP as above. Cyclic TPN, nocturnal TF @ 30 mL/hr; give 1,000 mL NS bolus. VTE - SCDs, Hep gtt Foley - Failed TOV. Foley in place with good UOP.     - per [facility] - PE - Noted on CTA 9/10. LE [place] negative for DVT. Heparin gtt. Hgb 7.5 from 8.6, agree with PM CBC. Agree with CCM that we may need to consider IVC temporary filter at some point if unable to restart heparin gtt (hx breast ca, recent surgery, immobilized). Shock - off pressors VDRF - extubated  Recent cardiac arrest  9/3 AKI - Cr stable on most recent labs Anemia - Transfuse to keep hemoglobin above 7. Consider IV iron supplementation - she has short gut so worried she will not absorb iron well orally. Last PT-INR 9/15 was 1.3.  Hx metastatic breast cancer   LOS: 21 days     Adam Phenix, [facility] 01/03/2023, 11:04 AM Please see Amion for pager number during day hours 7:00am-4:30pm

## 2023-01-03 NOTE — Progress Notes (Signed)
PROGRESS NOTE    [name]  WUJ:[id number] DOB: Sep 28, 1975 DOA: 12/12/2022 PCP: Ollen Bowl, MD   Brief Narrative:  CIDNEY NAFF is a 47 y.o. female with PMH significant for Crohn's disease with previous bowel obstruction s/p subtotal colectomy 2021, colonic stricture, IDA, breast cancer s/p mastectomy 2022 currently not on treatment. Recently hospitalized 8/10-8/15/2024 for bowel obstruction seen by GI and general surgery. Underwent EGD and flexible sigmoidoscopy and discharged on steroids.   9/3, [name] presented to the ED with abdominal distention, pain, nausea, vomiting CT abd/pelvis showed markedly dilated distal small bowel consistent with chronic obstruction. No changes since prior study.  [name] was admitted to [facility] with a plan of GI consultation.   9/4, in the morning, within few hours of admission, [name] was found pulseless on the floor in the bathroom in ED, estimated downtime about 5 minutes. ACLS was initiated. Concern for possible torsades de pointes and given IV mag, defibrillated.  ROSC achieved in about 5 minutes. She was intubated. She had copious vomitus in the ETT.  Notably, she had I-stat peri-code which showed hemoglobin of 5.5 down from 10 overnight.  Admitted to ICU In the ICU, [name] had right femoral CVC placed.  Given 3 minutes of PRBC, 3 units of FFP.  [name] was started on vasopressors   9/5, underwent exploratory laparotomy.  Noted to have internal hernia with associated small bowel ischemia.  Underwent bowel resection. 9/7, back to OR for ostomy placement 9/8, weaned off Levophed 9/10, extubated 9/10, found to have PE in CT angio chest.  Started on heparin drip which was subsequently stopped the next day because of drop in hemoglobin 9/12, mental status improved, became more interactive.  However failed swallow eval. 9/16, transferred out to [facility] 9/16, CT abdomen showed peritoneal fluid collection 9/17, underwent paracentesis, 500 cc of  clear yellow fluid drained 9/22, CT abdomen raised concern of early developing obstruction, peritonitis.  9/23, paracentesis by IR.  Drained 90 cc of turbid, amber/blood-tinged fluid   [name]'s hospitalization has been prolonged because of persistent fever, persistent high output from ostomy, anemia requiring blood transfusion, dysphagia.  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 Hillery Hunter s/p ex lap, ileostomy, abdominal closure 9/7 Dr. Hillery Hunter Currently only has 100 cm of small bowel left and hence started syndrome.  Continues to have significant ostomy output.   TPN dependent To slow down ostomy output, [name] is currently on Imodium, iron, FiberCon, Imodium.  Surgery following and managing.   Dysphagia Postextubation, [name] failed speech therapy  reevaluation multiple amps.  Gradually improving and currently on dysphagia 3 diet, tube feeding during the day and TPN during the night.    Persistent sepsis [name] continues to have persistent fever,  tachycardia and leukocytosis Extensive workup done.  9/19, ID consulted.  Currently on ciprofloxacin, Flagyl and micafungin. 9/22, CT abdomen raised concern of early developing obstruction, peritonitis. 9/23, paracentesis by IR.  Drained 90 cc of turbid, amber/blood-tinged fluid, culture negative.  Follow-up closely.  Persistent tachycardia Primarily due to sepsis but also contributed by effective hypovolemia and pulm embolism.  BP on the lower normal side. Currently on Metropol 50 mg twice daily.  She was also given IV fluid but was held after she developed anasarca with low albumin.   AKI Likely due to ATN related to septic shock.  Resolved.  Pulmonary embolism Left lower lobe segmental PE diagnosed on 9/10, initially started on heparin which was stopped after 1 day due to anemia but then resumed on 12/30/2022.   Acute on chronic anemia Hemoglobin trend as below.  Received a total of 9 units of PRBC so far. 9/21, heparin drip was resumed.  Hemoglobin fairly stable since 12/31/2022.  No active bleeding.  Continue to monitor  Hyperglycemia A1c 5.7 on 9//24 Continue SSI/Accu-Cheks  Loss of voice/hoarse voice Postextubation, [name] was noted to have dysphagia as well as loss of voice. If no improvement, we'll involve ENT after acute issues resolve however per [name], her voice is improving but very slowly.   Recurrent breast cancer: Follows up with oncology Dr. Pamelia Hoit   DVT prophylaxis: Place and maintain sequential compression device Start: 12/15/22 1031 SCDs Start: 12/13/22 0413   Code Status: Full Code  Family Communication:  None present at bedside.  Plan of care discussed with [name] in length and he/she verbalized understanding and agreed with it.  Status is: Inpatient Remains inpatient appropriate because: [name] is still very sick with multiple problems.  She is TPN and tube feed dependent.   Estimated body mass index is 22.36 kg/m as calculated from the following:    Height as of this encounter: 5\' 4"  (1.626 m).   Weight as of this encounter: 59.1 kg.    Nutritional Assessment: Body mass index is 22.36 kg/m.Marland Kitchen Seen by dietician.  I agree with the assessment and plan as outlined below: Nutrition Status: Nutrition Problem: Moderate Malnutrition Etiology: chronic illness (Crohn's disease; metastatic breast cancer) Signs/Symptoms: moderate fat depletion, moderate muscle depletion, percent weight loss (24% in 4.5 months) Percent weight loss: 24 % (in 4.5 months) Interventions: Tube feeding, TPN  . Skin Assessment: I have examined the [name]'s skin and I agree with the wound assessment as performed by the wound care RN as outlined below:    Consultants:  ID and general surgery  Procedures:  As above  Antimicrobials:  Anti-infectives (From admission, onward)    Start     Dose/Rate Route Frequency Ordered Stop   12/29/22 1215  ciprofloxacin (CIPRO) IVPB 400 mg        400 mg 200 mL/hr over 60 Minutes Intravenous 2 times daily 12/29/22 1123     12/29/22 1215  metroNIDAZOLE (FLAGYL) IVPB 500 mg        500 mg 100 mL/hr over 60 Minutes Intravenous 2 times daily 12/29/22 1123     12/28/22 1430  micafungin (MYCAMINE) 100 mg in sodium chloride 0.9 % 100 mL IVPB        100 mg 105 mL/hr over 1 Hours Intravenous Every 24 hours 12/28/22 1335  12/23/22 2000  vancomycin (VANCOREADY) IVPB 1250 mg/250 mL  Status:  Discontinued        1,250 mg 166.7 mL/hr over 90 Minutes Intravenous Every 36 hours 12/22/22 0612 12/22/22 1103   12/22/22 0645  vancomycin (VANCOREADY) IVPB 1250 mg/250 mL        1,250 mg 166.7 mL/hr over 90 Minutes Intravenous  Once 12/22/22 0551 12/22/22 0805   12/22/22 0600  piperacillin-tazobactam (ZOSYN) IVPB 3.375 g  Status:  Discontinued        3.375 g 12.5 mL/hr over 240 Minutes Intravenous Every 8 hours 12/22/22 0551 12/29/22 1123   12/13/22 1000  piperacillin-tazobactam (ZOSYN) IVPB 3.375 g  Status:  Discontinued        3.375  g 12.5 mL/hr over 240 Minutes Intravenous Every 8 hours 12/13/22 0833 12/20/22 1333         Subjective: [name] seen and examined.  She has no new complaints.  She was just concerned about tachycardia.  Objective: Vitals:   01/03/23 0500 01/03/23 0600 01/03/23 0636 01/03/23 0700  BP: (!) 106/53 (!) 96/52  (!) 95/59  Pulse: (!) 135 (!) 122 (!) 126 (!) 117  Resp: (!) 22 (!) 21 (!) 21 (!) 21  Temp: (!) 101.7 F (38.7 C) 100.2 F (37.9 C) 99.7 F (37.6 C) 99.7 F (37.6 C)  TempSrc:      SpO2: 98% 98% 99% 99%  Weight: 59.1 kg     Height:        Intake/Output Summary (Last 24 hours) at 01/03/2023 0951 Last data filed at 01/03/2023 0834 Gross per 24 hour  Intake 1844.08 ml  Output 3950 ml  Net -2105.92 ml   Filed Weights   01/01/23 0500 01/02/23 0500 01/03/23 0500  Weight: 56.5 kg 54.9 kg 59.1 kg    Examination:  General exam: Appears calm and comfortable but very deconditioned Respiratory system: Clear to auscultation. Respiratory effort normal. Cardiovascular system: S1 & S2 heard, sinus tachycardia. No JVD, murmurs, rubs, gallops or clicks. No pedal edema. Gastrointestinal system: Abdomen is nondistended, slight firm and has generalized tenderness with wound VAC attached. No organomegaly or masses felt. Normal bowel sounds heard. Central nervous system: Alert and oriented. No focal neurological deficits. Extremities: Symmetric 5 x 5 power. Skin: No rashes, lesions or ulcers Psychiatry: Judgement and insight appear normal. Mood & affect appropriate.    Data Reviewed: I have personally reviewed following labs and imaging studies  CBC: Recent Labs  Lab 12/31/22 0504 01/01/23 0456 01/01/23 1110 01/02/23 0517 01/03/23 0632  WBC 18.7* 17.6* 18.1* 18.6* 20.1*  HGB 8.6* 7.5* 7.8* 7.7* 7.6*  HCT 26.8* 24.0* 24.2* 24.7* 24.3*  MCV 88.2 90.2 88.3 90.8 90.7  PLT 326 317 314 340 348   Basic Metabolic Panel: Recent Labs  Lab 12/28/22 0346 12/30/22 0514  12/31/22 0504 01/01/23 0456 01/01/23 0617 01/02/23 0517  NA 137 136 134* 135 133* 138  K 4.0 4.1 4.3 3.8 3.8 4.2  CL 105 102 101 102 101 103  CO2 23 25 25 25 27 26   GLUCOSE 143* 129* 121* 149* 121* 121*  BUN 48* 40* 35* 33* 34* 33*  CREATININE 1.46* 1.06* 0.72 1.01* 0.94 0.86  CALCIUM 7.5* 7.8* 8.1* 7.2* 7.2* 8.0*  MG 2.1 1.9 2.0 1.9  --   --   PHOS 3.7 2.9 2.9 3.1  --   --    GFR: Estimated Creatinine Clearance: 71.3 mL/min (by C-G formula based on SCr of 0.86 mg/dL). Liver Function Tests: Recent Labs  Lab  12/28/22 0346 12/30/22 0514 01/01/23 0617  AST 23 24 32  ALT 19 23 26   ALKPHOS 126 149* 148*  BILITOT 0.7 0.6 0.4  PROT 4.8* 5.1* 5.6*  ALBUMIN <1.5* <1.5* <1.5*   No results for input(s): "LIPASE", "AMYLASE" in the last 168 hours. No results for input(s): "AMMONIA" in the last 168 hours. Coagulation Profile: Recent Labs  Lab 12/30/22 1338  INR 1.3*   Cardiac Enzymes: No results for input(s): "CKTOTAL", "CKMB", "CKMBINDEX", "TROPONINI" in the last 168 hours. BNP (last 3 results) No results for input(s): "PROBNP" in the last 8760 hours. HbA1C: No results for input(s): "HGBA1C" in the last 72 hours. CBG: Recent Labs  Lab 01/02/23 0821 01/02/23 1153 01/02/23 2031 01/03/23 0000 01/03/23 0805  GLUCAP 109* 90 132* 173* 100*   Lipid Profile: Recent Labs    01/01/23 0456  TRIG 69   Thyroid Function Tests: No results for input(s): "TSH", "T4TOTAL", "FREET4", "T3FREE", "THYROIDAB" in the last 72 hours. Anemia Panel: No results for input(s): "VITAMINB12", "FOLATE", "FERRITIN", "TIBC", "IRON", "RETICCTPCT" in the last 72 hours. Sepsis Labs: No results for input(s): "PROCALCITON", "LATICACIDVEN" in the last 168 hours.  Recent Results (from the past 240 hour(s))  Aerobic/Anaerobic Culture w Gram Stain (surgical/deep wound)     Status: None   Collection Time: 12/26/22  3:07 PM   Specimen: PATH Cytology Peritoneal fluid  Result Value Ref Range Status    Specimen Description   Final    PERITONEAL Performed at [facility], [address], [place], [place] [address]    Special Requests   Final    NONE Performed at [facility], [address], [place], [place] [address]    Gram Stain   Final    FEW WBC PRESENT,BOTH PMN AND MONONUCLEAR NO ORGANISMS SEEN    Culture   Final    No growth aerobically or anaerobically. Performed at [facility], [address] [place], [place] [address]    Report Status 12/31/2022 FINAL  Final  Fungus culture, blood     Status: None   Collection Time: 12/27/22  1:07 PM   Specimen: BLOOD  Result Value Ref Range Status   Specimen Description   Final    BLOOD SITE NOT SPECIFIED Performed at [facility], [address], [place], [place] [address]    Special Requests   Final    BOTTLES DRAWN AEROBIC ONLY Blood Culture adequate volume Performed at [facility], [address] [place], [place] [address]    Culture   Final    NO GROWTH 7 DAYS NO FUNGUS ISOLATED Performed at [facility], [address], [place], [place] [address]    Report Status 01/03/2023 FINAL  Final  Culture, blood (Routine X 2) w Reflex to ID Panel     Status: None   Collection Time: 12/27/22  1:07 PM   Specimen: BLOOD RIGHT ARM  Result Value Ref Range Status   Specimen Description   Final    BLOOD RIGHT ARM Performed at [facility], [address], [place], [place] [address]    Special Requests   Final    BOTTLES DRAWN AEROBIC AND ANAEROBIC Blood Culture adequate volume Performed at [facility], [address] [place], [place] [address]    Culture   Final    NO GROWTH 5 DAYS Performed at [facility], [address] [place], [place] [address]    Report  Status 01/01/2023 FINAL  Final  Culture, blood (Routine X 2) w Reflex to ID Panel     Status: None   Collection Time: 12/27/22  1:07 PM   Specimen:  BLOOD LEFT ARM  Result Value Ref Range Status   Specimen Description   Final    BLOOD LEFT ARM Performed at [facility], [address], [place], [place] [address]    Special Requests   Final    BOTTLES DRAWN AEROBIC ONLY Blood Culture adequate volume Performed at [facility], [address], [place], [place] [address]    Culture   Final    NO GROWTH 5 DAYS Performed at [facility], [address], [place], [place] [address]    Report Status 01/01/2023 FINAL  Final  Culture, fungus without smear     Status: None (Preliminary result)   Collection Time: 01/01/23 11:52 AM   Specimen: Peritoneal Washings; Other  Result Value Ref Range Status   Specimen Description   Final    PERITONEAL Performed at [facility], [address], [place], [place] [address]    Special Requests   Final    NONE Performed at [facility], [address] [place], [place] [address]    Culture   Final    NO FUNGUS ISOLATED AFTER 1 DAY Performed at [facility], [address], [place], [place] [address]    Report Status PENDING  Incomplete  Aerobic/Anaerobic Culture w Gram Stain (surgical/deep wound)     Status: None (Preliminary result)   Collection Time: 01/01/23 11:52 AM   Specimen: Peritoneal Washings  Result Value Ref Range Status   Specimen Description   Final    PERITONEAL Performed at [facility], [address], [place], [place] [address]    Special Requests   Final    NONE Performed at [facility], [address], [place], [place] [address]    Gram Stain   Final    ABUNDANT WBC PRESENT, PREDOMINANTLY MONONUCLEAR NO ORGANISMS SEEN    Culture   Final    NO GROWTH 2 DAYS Performed at [facility], [address] [place], [place] [address]    Report Status PENDING  Incomplete     Radiology Studies: US Paracentesis  Result Date: 01/01/2023 INDICATION: [name]  with history of Crohn's disease with prior subtotal colectomy 2021, small-bowel resection on 12/14/2022, ileostomy on 12/16/2022, metastatic breast cancer, ascites. Request received for diagnostic and therapeutic paracentesis. EXAM: ULTRASOUND GUIDED DIAGNOSTIC AND THERAPEUTIC PARACENTESIS MEDICATIONS: 8 mL 1% lidocaine COMPLICATIONS: None immediate. PROCEDURE: Informed written consent was obtained from the [name] after a discussion of the risks, benefits and alternatives to treatment. A timeout was performed prior to the initiation of the procedure. Initial ultrasound scanning demonstrates a small amount of ascites within the right lower abdominal quadrant. The right lower abdomen was prepped and draped in the usual sterile fashion. 1% lidocaine was used for local anesthesia. Following this, a 19 gauge, 7-cm, Yueh catheter was introduced. An ultrasound image was saved for documentation purposes. The paracentesis was performed. The catheter was removed and a dressing was applied. The [name] tolerated the procedure well without immediate post procedural complication. FINDINGS: A total of approximately 90 cc of turbid, amber/blood-tinged fluid was removed. Samples were sent to the laboratory as requested by the clinical team. Due to significant loculations and despite catheter manipulation only the above amount of fluid could be removed  today. IMPRESSION: Successful ultrasound-guided diagnostic and therapeutic paracentesis yielding 90 cc of peritoneal fluid. Performed by: Artemio Aly Electronically Signed   By: Malachy Moan M.D.   On: 01/01/2023 13:16    Scheduled Meds:  ascorbic acid  500 mg Per Tube BID   cholecalciferol  400 Units Per Tube Daily   diphenoxylate-atropine  1 tablet Per Tube BID   famotidine  20 mg Per Tube Daily   feeding supplement (KATE FARMS STANDARD 1.4)  325 mL Oral BID BM   feeding supplement (KATE FARMS STANDARD 1.4)  360 mL Per Tube Daily   ferrous sulfate  300 mg Per  Tube BID   fiber  1 packet Per Tube TID   gabapentin  200 mg Per Tube Q8H   insulin aspart  0-9 Units Subcutaneous 4 times per day   loperamide HCl  4 mg Per Tube QID   metoprolol tartrate  50 mg Per Tube BID   mouth rinse  15 mL Mouth Rinse 4 times per day   sodium chloride flush  10-40 mL Intracatheter Q12H   vitamin E  100 Units Per Tube Daily   zinc sulfate  220 mg Per Tube Daily   Continuous Infusions:  ciprofloxacin Stopped (01/02/23 2315)   heparin 1,850 Units/hr (01/03/23 0834)   metronidazole Stopped (01/02/23 2319)   micafungin (MYCAMINE) 100 mg in sodium chloride 0.9 % 100 mL IVPB Stopped (01/02/23 1418)   phytonadione (VITAMIN K) 2 mg in dextrose 5 % 50 mL IVPB Stopped (12/28/22 1642)   TPN CYCLIC-ADULT (ION)       LOS: 21 days   Hughie Closs, MD [facility]  01/03/2023, 9:51 AM   *Please note that this is a verbal dictation therefore any spelling or grammatical errors are due to the "Dragon Medical One" system interpretation.  Please page via Amion and do not message via secure chat for urgent [name] care matters. Secure chat can be used for non urgent [name] care matters.  How to contact the [facility] Attending or Consulting provider 7A - 7P or covering provider during after hours 7P -7A, for this [name]?  Check the care team in [facility] and look for a) attending/consulting [facility] provider listed and b) the [facility] team listed. Page or secure chat 7A-7P. Log into [email or web address] and use [place] universal password to access. If you do not have the password, please contact the hospital operator. Locate the [facility] provider you are looking for under [facility] and page to a number that you can be directly reached. If you still have difficulty reaching the provider, please page the [facility] (Director on Call) for the Hospitalists listed on amion for assistance.

## 2023-01-03 NOTE — Progress Notes (Signed)
Patient foley temp 101.1, she already received scheduled tylenol. Provider Chinita Greenland, NP made aware. No new orders at this time.

## 2023-01-03 NOTE — Progress Notes (Signed)
Occupational Therapy Treatment Patient Details Name: [name] MRN: [id number] DOB: 14-Mar-1976 Today's Date: 01/03/2023   History of present illness Patient is a 47 year old female who presented on 9/3 with abdominal distension and nausea/vomiting. 9/4 patient was found in ED bathroom with no pulse with ROSC in about 5 mins. Patient was intubated at this time. 9/5 patient underwent exploratory laparotomy that revealed internal hernia with associated small bowel ischemia with patient undergoing bowel resection. 9/7 patient returned to OR for ostomy placement. Patient was extubated on 9/10 and also found to have PE. PMH: breast cancer, Chron's disease, IDA, recent hospitalization 8/10 to 8/15 with bowel obstruction and EGD   OT comments  Patient was able to transfer with min A x2 to recliner in room with patient reporting feeling like legs were heavier today. Patient was encouraged to keep getting up out of bed at least once a day if not twice a day to increase functional activity tolerance. Patient verbalized understanding. Patient was able to progress with LB dressing with LLE able to participate in figure four position at bed level for donning socks. Patient will benefit from intensive inpatient follow up therapy, >3 hours/day       If plan is discharge home, recommend the following:  Two people to help with walking and/or transfers;Two people to help with bathing/dressing/bathroom;Direct supervision/assist for medications management;Assistance with cooking/housework;Assist for transportation;Help with stairs or ramp for entrance;Direct supervision/assist for financial management   Equipment Recommendations  None recommended by OT       Precautions / Restrictions Precautions Precautions: Fall Precaution Comments: abdominal precautions, wound vac, abominal binder, ostomy, nasal tube Restrictions Weight Bearing Restrictions: No       Mobility Bed Mobility Overal bed mobility: Needs  Assistance Bed Mobility: Supine to Sit     Supine to sit: Mod assist, Used rails, HOB elevated     General bed mobility comments: assist to raise trunk and pivot hips to EOB, VCs for technique. educated on log rolling importance             Balance Overall balance assessment: Needs assistance Sitting-balance support: Bilateral upper extremity supported, Feet supported Sitting balance-Leahy Scale: Fair     Standing balance support: Reliant on assistive device for balance, Single extremity supported, During functional activity Standing balance-Leahy Scale: Poor         ADL either performed or assessed with clinical judgement   ADL Overall ADL's : Needs assistance/impaired   Lower Body Dressing: Moderate assistance;Bed level Lower Body Dressing Details (indicate cue type and reason): patient was able to complete figure four positioning of LLE with min A to get sock over all toes. patient was unable to maintain figure four positioning with RLE at bed level and don sock with max A to don sock. patient reported it felt good to stretch.       General ADL Comments: patient was educated on importance of sitting up in chair multiple times a day to increased time OOB and functional activity tolerance. patient verbalized understanding.      Cognition Arousal: Alert Behavior During Therapy: WFL for tasks assessed/performed Overall Cognitive Status: Within Functional Limits for tasks assessed       General Comments: AxO x 3 pleasant and willing.  Soft to no voice                   Pertinent Vitals/ Pain       Pain Assessment Pain Assessment: No/denies pain  Frequency  Min 2X/week        Progress Toward Goals  OT Goals(current goals can now be found in the care plan section)  Progress towards OT goals: Progressing toward goals     Plan         AM-PAC OT "6 Clicks" Daily Activity     Outcome Measure   Help from another person eating meals?: A  Little Help from another person taking care of personal grooming?: A Lot Help from another person toileting, which includes using toliet, bedpan, or urinal?: A Lot Help from another person bathing (including washing, rinsing, drying)?: A Lot Help from another person to put on and taking off regular upper body clothing?: A Lot Help from another person to put on and taking off regular lower body clothing?: A Lot 6 Click Score: 13    End of Session Equipment Utilized During Treatment: Rolling walker (2 wheels)  OT Visit Diagnosis: Unsteadiness on feet (R26.81);Other abnormalities of gait and mobility (R26.89);Pain   Activity Tolerance Patient limited by pain   Patient Left with call bell/phone within reach;in chair   Nurse Communication Mobility status        Time: 1610-9604 OT Time Calculation (min): 30 min  Charges: OT General Charges $OT Visit: 1 Visit OT Treatments $Therapeutic Activity: 23-37 mins  Rosalio Loud, MS Acute Rehabilitation Department Office# [phone]   Selinda Flavin 01/03/2023, 2:08 PM

## 2023-01-03 NOTE — Progress Notes (Signed)
Ice packs applied and 650mg  tyenol given per tube.

## 2023-01-03 NOTE — Progress Notes (Signed)
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 Active Problems:   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   Malignant neoplasm of upper-outer quadrant of right breast in female, estrogen receptor positive (HCC)   Hypokalemia   Port-A-Cath in place   Breast cancer (HCC)   Crohn's disease of both small and large intestine with intestinal obstruction (HCC)   Leukocytosis   Chronic disease anemia   Protein-calorie malnutrition, severe (HCC)   Failure to thrive in adult   Anorexia   Cardiac arrest (HCC)   Septic shock (HCC)   SBO with strangulation s/p resection 12/14/2022   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  Underwent ex lap on 9/5 for bowel resection due to bowel ischemia # Ex lap, ileostomy and abdominal closure on 9/7 - She developed shortness and high output ostomy.  Continued fever and leukocytosis. - CT 9/16 showed large amount of ascites.  She had been on vancomycin and Zosyn at that point ID was engaged.  Micafungin was added on 9/17 for possible fungemia. - Patient has continued to feed her on ciprofloxacin, metronidazole and micafungin.  Blood and fungal cultures have been no growth so far form 9/18 9/7 paracentesis with no growth. - On 9/22 CT abdomen pelvis there is concern for early developing obstruction/peritonitis.  Repeat paracentesis on 9/23.  General surgery engaged and noted that  abdominal exam is not impressive, may need repeat blood cultures source could be lines or urine.  Repeat scan if no other source Thursday or Friday. - Patient does not have urinary symptoms.  As previously noted blood cultures and fungal cultures from 9/18 are with no growth. Recommendations: -As patient continues to fever and have leukocytosis on broad-spectrum antibiotics and  antifungal would pull central line.  Of note I suspect a developing obstruction/peritonitis may be contributing to fever and leukocytosis as well. - Continue antibiotics. -Followed paracentesis cultures from 9/23, no growth so far, on 9/17 paracentesis cultures no growth -Repeat blood cultures 1 from peripheral 1 from PICC   Microbiology:   Antibiotics: Ciprofloxacin, metronidazole, micafungin Cultures: Blood 9/18 fungal blood cultures no growth Urine  Other   SUBJECTIVE: Resting in bed.  No new complaints. Interval: Tmax of 101.7 overnight, WBC 20.  Review of Systems: Review of Systems  All other systems reviewed and are negative.    Scheduled Meds:  ascorbic acid  500 mg Per Tube BID   cholecalciferol  400 Units Per Tube Daily   diphenoxylate-atropine  1 tablet Per Tube BID   famotidine  20 mg Per Tube Daily   feeding supplement ([employer] STANDARD 1.4)  325 mL Oral BID BM   ferrous sulfate  300 mg Per Tube BID   fiber  1 packet Per Tube TID   gabapentin  200 mg Per Tube Q8H   insulin aspart  0-9 Units Subcutaneous 4 times per day   loperamide HCl  4  mg Per Tube QID   metoprolol tartrate  50 mg Per Tube BID   mouth rinse  15 mL Mouth Rinse 4 times per day   sodium chloride flush  10-40 mL Intracatheter Q12H   vitamin E  100 Units Per Tube Daily   zinc sulfate  220 mg Per Tube Daily   Continuous Infusions:  ciprofloxacin 400 mg (01/03/23 1106)   heparin 1,850 Units/hr (01/03/23 0834)   metronidazole Stopped (01/03/23 1105)   micafungin (MYCAMINE) 100 mg in sodium chloride 0.9 % 100 mL IVPB  Stopped (01/02/23 1418)   phytonadione (VITAMIN K) 2 mg in dextrose 5 % 50 mL IVPB Stopped (12/28/22 1642)   TPN CYCLIC-ADULT (ION)     PRN Meds:.acetaminophen, albuterol, artificial tears, fentaNYL (SUBLIMAZE) injection, food thickener, labetalol, lip balm, LORazepam, [DISCONTINUED] ondansetron **OR** ondansetron (ZOFRAN) IV, mouth rinse, mouth rinse, oxyCODONE, silver nitrate applicators, sodium chloride flush Allergies  Allergen Reactions   Nsaids Other (See Comments)    Crohns disease/ IBD   Chlorhexidine Gluconate Itching    Able to use CHG swabs for ports, reaction with direct skin contact   Hydromorphone Other (See Comments)    Confusion/sedation   Lactose Intolerance (Gi) Other (See Comments)    Upset stomach/diarrhea    OBJECTIVE: Vitals:   01/03/23 1100 01/03/23 1200 01/03/23 1300 01/03/23 1400  BP:  92/63 105/75 110/73  Pulse: (!) 115 (!) 101 (!) 107 (!) 108  Resp: 20 19 (!) 24 (!) 21  Temp: 99.7 F (37.6 C) 99.9 F (37.7 C) 99.7 F (37.6 C) 99.9 F (37.7 C)  TempSrc:  Bladder    SpO2: 100% 100% 100% 100%  Weight:      Height:       Body mass index is 22.36 kg/m.  Physical Exam Constitutional:      Appearance: Normal appearance.  HENT:     Head: Normocephalic and atraumatic.     Right Ear: Tympanic membrane normal.     Left Ear: Tympanic membrane normal.     Nose: Nose normal.     Mouth/Throat:     Mouth: Mucous membranes are moist.  Eyes:     Extraocular Movements: Extraocular movements intact.     Conjunctiva/sclera: Conjunctivae normal.     Pupils: Pupils are equal, round, and reactive to light.  Cardiovascular:     Rate and Rhythm: Normal rate and regular rhythm.     Heart sounds: No murmur heard.    No friction rub. No gallop.  Pulmonary:     Effort: Pulmonary effort is normal.     Breath sounds: Normal breath sounds.  Abdominal:     Comments: Abdominal wound  Musculoskeletal:        General: Normal range of motion.  Skin:    General: Skin  is warm and dry.  Neurological:     General: No focal deficit present.     Mental Status: She is alert and oriented to person, place, and time.  Psychiatric:        Mood and Affect: Mood normal.       Lab Results Lab Results  Component Value Date   WBC 20.1 (H) 01/03/2023   HGB 7.6 (L) 01/03/2023   HCT 24.3 (L) 01/03/2023   MCV 90.7 01/03/2023   PLT 348 01/03/2023    Lab Results  Component Value Date   CREATININE 0.86 01/02/2023   BUN 33 (H) 01/02/2023   NA 138 01/02/2023   K 4.2 01/02/2023   CL 103 01/02/2023  CO2 26 01/02/2023    Lab Results  Component Value Date   ALT 26 01/01/2023   AST 32 01/01/2023   ALKPHOS 148 (H) 01/01/2023   BILITOT 0.4 01/01/2023        Courtney Earthly, MD [facility] 01/03/2023, 3:28 PM

## 2023-01-03 NOTE — Progress Notes (Signed)
Brief Nutrition Note  Per discussion with Surgery PA, can discontinue nocturnal TF and allow patient to eat during the day.    - Discontinue tube feeds.  - Continue DYS 3 with nectar thickened liquids. [employer] 1.4 orally BID, each supplement provides 455 kcal and 20 grams protein.   - Continue cyclic TPN x12 hours overnight. Meeting 100% of estimated needs.             - TPN management per Pharmacy   Shelle Iron RD, LDN For contact information, refer to [employer].

## 2023-01-03 NOTE — Consult Note (Signed)
WOC Nurse wound follow up; this visit made with E. Simaan, PA CCS  Wound type: full thickness surgical  Measurement: see 01/01/2023 note  Wound bed:90% pink moist 10% tan fibrinous  Drainage (amount, consistency, odor) minimal serosanguinous  Periwound: intact, ileostomy to left of wound (unable to change NPWT without changing ileostomy pouch)  Dressing procedure/placement/frequency: Removed old NPWT dressing Cleansed wound with normal saline  Filled wound with 1 piece white foam, 1 piece black foam   Sealed NPWT dressing at HG Patient received IV and PO pain medication per bedside nurse prior to dressing change Patient tolerated procedure well  WOC nurse will continue to provide NPWT dressing changes due to the complexity of the dressing change; next change Friday 01/05/2023 with CCS PA; supplies in room    WOC Nurse ostomy follow up; unable to change NPWT without also changing ileostomy pouch at this time  Stoma type/location: LLQ ileostomy  Stomal assessment/size: 1 1/4" well budded, pink moist, round  Peristomal assessment: intact, midline incision to R of ostomy as above  Treatment options for stomal/peristomal skin: skin prep applied to surrounding skin, 2" barrier ring  Output approximately 400 mls yellow green effluent  Ostomy pouching: 2 piece 2 1/4" skin barrier [place] [phone]) 2 1/4" high output pouch [place] [phone]) and 2" barrier ring [place] [phone])  Education provided: Patient can verbalize steps to changing ostomy at this time. No  hands on participation in changing pouch today as  pending chest x-ray and other testing.  This RN cut new barrier at 1 1/4" round.  We placed 2" barrier ring around stoma and skin barrier on top of that. I  snapped on  a high output pouch and hooked to bedside drainage bag.     Enrolled patient in [place] Secure Start Discharge program: Yes previously   Supplies in room including (3) 2 1/4" skin barriers, 2" barrier rings, high output  pouches, (1) large black foam kit and (1) white foam kit.   WOC team will follow as above.   Thank you,    Priscella Mann MSN, RN-BC, [employer] [phone]

## 2023-01-03 NOTE — Progress Notes (Addendum)
ANTICOAGULATION CONSULT NOTE  Pharmacy Consult for heparin Indication: pulmonary embolus  Allergies  Allergen Reactions   Nsaids Other (See Comments)    Crohns disease/ IBD   Chlorhexidine Gluconate Itching    Able to use CHG swabs for ports, reaction with direct skin contact   Hydromorphone Other (See Comments)    Confusion/sedation   Lactose Intolerance (Gi) Other (See Comments)    Upset stomach/diarrhea    Patient Measurements: Height: 5\' 4"  (162.6 cm) Weight: 59.1 kg (130 lb 4.7 oz) IBW/kg (Calculated) : 54.7 Heparin Dosing Weight: TBW  Vital Signs: Temp: 99.7 F (37.6 C) (09/25 0636) Temp Source: Bladder (09/24 2000) BP: 96/52 (09/25 0600) Pulse Rate: 126 (09/25 0636)  Labs: Recent Labs    01/01/23 0456 01/01/23 0617 01/01/23 1110 01/02/23 0517 01/03/23 0632  HGB 7.5*  --  7.8* 7.7* 7.6*  HCT 24.0*  --  24.2* 24.7* 24.3*  PLT 317  --  314 340 348  HEPARINUNFRC  --  0.35  --  0.31 0.23*  CREATININE 1.01* 0.94  --  0.86  --     Estimated Creatinine Clearance: 71.3 mL/min (by C-G formula based on SCr of 0.86 mg/dL).   Medications:  No AC PTA.  Assessment: 47 yo F with a PMH of recurrent breast cancer not currently on treatment, who presented to the ED on 12/12/2022 with abdominal pain, nausea, vomiting diarrhea.   Patient was placed on heparin drip from 9/10-9/12 for new LLL PE. On 9/12 heparin drip was discontinued due to hemoglobin dropping and intermittent bleeding in ostomy.   On 9/21:  Pharmacy has been consulted to resume heparin drip after transfusion (Hgb 7, transfused 1 unit PRBC)  Today 01/03/2023 Heparin level: 0.23, subtherapeutic on heparin 1700 units/hr Hgb: 7.6 (slowly decreasing), Plt stable/WNL at 348 Per RN, No signs of bleeding and no complications at the infusion site  Goal of Therapy:  Heparin level 0.3-0.7 units/ml Monitor platelets by anticoagulation protocol: Yes   Plan:  No bolus per previous MD order Increase infusion to  1850 units/hr Recheck CBC and heparin level in 6 hours Monitor for signs/symptoms of bleeding Follow up long-term anticoagulation plans.   [name] 01/03/2023,7:09 AM    I have reviewed and agree with the assessment and plan as stated.  Lynann Beaver PharmD, BCPS [facility] main pharmacy [phone] 01/03/2023 9:33 AM

## 2023-01-03 NOTE — Plan of Care (Signed)
Problem: Education: Goal: Ability to describe self-care measures that may prevent or decrease complications (Diabetes Survival Skills Education) will improve Outcome: Progressing Goal: Individualized Educational Video(s) Outcome: Progressing   Problem: Coping: Goal: Ability to adjust to condition or change in health will improve Outcome: Progressing   Problem: Fluid Volume: Goal: Ability to maintain a balanced intake and output will improve Outcome: Progressing   Problem: Health Behavior/Discharge Planning: Goal: Ability to identify and utilize available resources and services will improve Outcome: Progressing Goal: Ability to manage health-related needs will improve Outcome: Progressing   Problem: Metabolic: Goal: Ability to maintain appropriate glucose levels will improve Outcome: Progressing   Problem: Nutritional: Goal: Maintenance of adequate nutrition will improve Outcome: Progressing Goal: Progress toward achieving an optimal weight will improve Outcome: Progressing   Problem: Skin Integrity: Goal: Risk for impaired skin integrity will decrease Outcome: Progressing   Problem: Tissue Perfusion: Goal: Adequacy of tissue perfusion will improve Outcome: Progressing   Problem: Education: Goal: Knowledge of General Education information will improve Description: Including pain rating scale, medication(s)/side effects and non-pharmacologic comfort measures Outcome: Progressing   Problem: Health Behavior/Discharge Planning: Goal: Ability to manage health-related needs will improve Outcome: Progressing   Problem: Clinical Measurements: Goal: Ability to maintain clinical measurements within normal limits will improve Outcome: Progressing Goal: Will remain free from infection Outcome: Progressing Goal: Diagnostic test results will improve Outcome: Progressing Goal: Respiratory complications will improve Outcome: Progressing Goal: Cardiovascular complication will  be avoided Outcome: Progressing   Problem: Activity: Goal: Risk for activity intolerance will decrease Outcome: Progressing   Problem: Nutrition: Goal: Adequate nutrition will be maintained Outcome: Progressing   Problem: Coping: Goal: Level of anxiety will decrease Outcome: Progressing   Problem: Elimination: Goal: Will not experience complications related to bowel motility Outcome: Progressing Goal: Will not experience complications related to urinary retention Outcome: Progressing   Problem: Pain Managment: Goal: General experience of comfort will improve Outcome: Progressing   Problem: Safety: Goal: Ability to remain free from injury will improve Outcome: Progressing   Problem: Skin Integrity: Goal: Risk for impaired skin integrity will decrease Outcome: Progressing   Problem: Fluid Volume: Goal: Hemodynamic stability will improve Outcome: Progressing   Problem: Clinical Measurements: Goal: Diagnostic test results will improve Outcome: Progressing Goal: Signs and symptoms of infection will decrease Outcome: Progressing   Problem: Respiratory: Goal: Ability to maintain adequate ventilation will improve Outcome: Progressing

## 2023-01-03 NOTE — Progress Notes (Signed)
ANTICOAGULATION CONSULT NOTE  Pharmacy Consult for heparin Indication: pulmonary embolus  Allergies  Allergen Reactions   Nsaids Other (See Comments)    Crohns disease/ IBD   Chlorhexidine Gluconate Itching    Able to use CHG swabs for ports, reaction with direct skin contact   Hydromorphone Other (See Comments)    Confusion/sedation   Lactose Intolerance (Gi) Other (See Comments)    Upset stomach/diarrhea    Patient Measurements: Height: 5\' 4"  (162.6 cm) Weight: 59.1 kg (130 lb 4.7 oz) IBW/kg (Calculated) : 54.7 Heparin Dosing Weight: TBW  Vital Signs: Temp: 99.9 F (37.7 C) (09/25 1600) Temp Source: Bladder (09/25 1600) BP: 124/78 (09/25 1600) Pulse Rate: 115 (09/25 1600)  Labs: Recent Labs    01/01/23 0456 01/01/23 0617 01/01/23 1110 01/02/23 0517 01/03/23 0632 01/03/23 1626  HGB 7.5*  --  7.8* 7.7* 7.6*  --   HCT 24.0*  --  24.2* 24.7* 24.3*  --   PLT 317  --  314 340 348  --   HEPARINUNFRC  --  0.35  --  0.31 0.23* 0.46  CREATININE 1.01* 0.94  --  0.86  --   --     Estimated Creatinine Clearance: 71.3 mL/min (by C-G formula based on SCr of 0.86 mg/dL).   Medications:  No AC PTA.  Assessment: 47 yo F with a PMH of recurrent breast cancer not currently on treatment, who presented to the ED on 12/12/2022 with abdominal pain, nausea, vomiting diarrhea.   Patient was placed on heparin drip from 9/10-9/12 for new LLL PE. On 9/12 heparin drip was discontinued due to hemoglobin dropping and intermittent bleeding in ostomy.   On 9/21:  Pharmacy has been consulted to resume heparin drip after transfusion (Hgb 7, transfused 1 unit PRBC)  Today 01/03/2023 @ 1626 Heparin level: 0.46, therapeutic after heparin rate increased to 1850 units/hr Hgb: 7.6 (slowly decreasing), Plt stable/WNL at 348 Per RN, No signs of bleeding and no complications at the infusion site  Goal of Therapy:  Heparin level 0.3-0.7 units/ml Monitor platelets by anticoagulation protocol: Yes    Plan:  No bolus per previous MD order Continue heparin infusion @ 1850 units/hr Monitor for signs/symptoms of bleeding Follow up long-term anticoagulation plans.    Herby Abraham, Pharm.D Use secure chat for questions 01/03/2023 5:29 PM

## 2023-01-03 NOTE — Progress Notes (Addendum)
PHARMACY - TOTAL PARENTERAL NUTRITION CONSULT NOTE   Indication: Short bowel syndrome  Patient Measurements: Height: 5\' 4"  (162.6 cm) Weight: 59.1 kg (130 lb 4.7 oz) IBW/kg (Calculated) : 54.7   Body mass index is 22.36 kg/m. Usual Weight: 46.3 kg from 8/28  Assessment: 47 y.o. female with PMH of recurrent breast cancer not currently on treatment, Crohn's disease s/p subtotal colectomy in 2021, recurrent obstructions, colonic stricture, recent hospitalization 8/10-8/15/2024 for obstruction who presented on 12/12/2022 with abdominal pain, nausea, vomiting diarrhea.    9/4 Admit; found down in ED bathroom with no pulse, CPR > intubated 9/5 Exp-lap, found to have internal hernia with small bowel ischemia, s/p SBR only 105 cm of small bowel remaining 9/7 OR for closure, ostomy creation 9/8 pharmacy consulted to dose TPN, transitioned to cyclic TPN 9/15 due to plan for long-term TPN for malabsorption with short gut.  Glucose / Insulin: no Hx DM - steroids PTA for Crohn's disease; weaned off as of 9/13 - CBGs 90-109 while TPN off, CBGs 132-173 while on TPN + tube feeds (goal < 180) - SSI: 4 units given in past 24hrs Electrolytes: Electrolytes WNL, CorrCa 10 (iCa ip) Renal: SCr <1. BUN 33 (AKI s/p arrest, now improved)  Hepatic: LFTs, Tbili WNL.  Alk Phos elevated at 148, Albumin low  - TG: WNL (elevated 9/10, but normalized after stopping propofol 9/11) I/O: net -1.2 L / 24 hrs - UOP: 2375 mL - Ileostomy: 1200 mL - Emesis/NG: 50 mL GI Imaging:  - CTA 9/10 shows new PE, but also abdominal ascites - CT Abd: Large volume ascites GI Surgeries / Procedures:  - OR 9/5 ex lap, internal hernia w/ small bowel ischemia, SBR & celiotomy; > 105 cm small bowel remaining - OR 9/7 for closure, ostomy creation - 9/17: Paracentesis - 9/23 paracentesis   Central access: triple lumen CVC R subclavian 9/6, [facility] 05/02/22 TPN start date: 12/17/22  Nutritional Goals: TPN provides 94 g protein and 1680 kcal  per day  RD Assessment: Estimated Needs Total Energy Estimated Needs: 1600-1800 Total Protein Estimated Needs: 85-95 grams Total Fluid Estimated Needs: 1.6L/day  Current Nutrition:  Cyclic TPN (18:00-06:00) Nocturnal tube feeds: [employer] 1.4 at 78mL/hr (18:00-06:00) Diet: DYS 3 with nectar thickened liquids  [employer] 1.4 orally BID, each supplement provides 455 kcal and 20 grams protein.   CCS MD requesting repletion of vitamins ADEK.  - Levels in process 9/19 -Vitamin A level pending prior to supplementation > Dietician to follow -Vitamin K IV weekly, Vitamin D and E per tube  Plan: Continue cyclic TPN over 12 hr (18:00- 06:00) Max glucose infusion rate 6.04 mg/kg/min Electrolytes in TPN:  Na 120 mEq/L K 50 mEq/L Ca 5 mEq/L  Mg 10 mEq/L Phos 15 mmol/L Cl:Ac ratio 1:2 Add standard MVI and trace elements to TPN Chromium on hold d/t critical shortage Continue CBG checks 4x daily while on cyclic TPN (1-2 hr after completing ramp-up, on TPN, 1 hr after TPN stopped, off TPN) Monitor TPN labs on Mon/Thurs and as needed   Lynann Beaver PharmD, BCPS [facility] main pharmacy [phone] 01/03/2023 7:04 AM

## 2023-01-04 ENCOUNTER — Encounter (HOSPITAL_COMMUNITY): Payer: Self-pay | Admitting: Internal Medicine

## 2023-01-04 ENCOUNTER — Inpatient Hospital Stay: Payer: Managed Care, Other (non HMO) | Admitting: Hematology and Oncology

## 2023-01-04 ENCOUNTER — Encounter: Payer: Self-pay | Admitting: *Deleted

## 2023-01-04 ENCOUNTER — Encounter: Payer: Self-pay | Admitting: Internal Medicine

## 2023-01-04 ENCOUNTER — Inpatient Hospital Stay: Payer: Managed Care, Other (non HMO) | Admitting: Dietician

## 2023-01-04 DIAGNOSIS — K90829 Short bowel syndrome, unspecified: Secondary | ICD-10-CM | POA: Diagnosis not present

## 2023-01-04 LAB — ACID FAST SMEAR (AFB, MYCOBACTERIA): Acid Fast Smear: NEGATIVE

## 2023-01-04 LAB — COMPREHENSIVE METABOLIC PANEL
ALT: 30 U/L (ref 0–44)
AST: 44 U/L — ABNORMAL HIGH (ref 15–41)
Albumin: 1.5 g/dL — ABNORMAL LOW (ref 3.5–5.0)
Alkaline Phosphatase: 196 U/L — ABNORMAL HIGH (ref 38–126)
Anion gap: 8 (ref 5–15)
BUN: 27 mg/dL — ABNORMAL HIGH (ref 6–20)
CO2: 25 mmol/L (ref 22–32)
Calcium: 8 mg/dL — ABNORMAL LOW (ref 8.9–10.3)
Chloride: 101 mmol/L (ref 98–111)
Creatinine, Ser: 0.64 mg/dL (ref 0.44–1.00)
GFR, Estimated: >60 mL/min (ref >60)
Glucose, Bld: 129 mg/dL — ABNORMAL HIGH (ref 70–99)
Potassium: 4.2 mmol/L (ref 3.5–5.1)
Sodium: 134 mmol/L — ABNORMAL LOW (ref 135–145)
Total Bilirubin: 0.7 mg/dL (ref 0.3–1.2)
Total Protein: 6.2 g/dL — ABNORMAL LOW (ref 6.5–8.1)

## 2023-01-04 LAB — CBC
HCT: 24.5 % — ABNORMAL LOW (ref 36.0–46.0)
Hemoglobin: 7.7 g/dL — ABNORMAL LOW (ref 12.0–15.0)
MCH: 28.1 pg (ref 26.0–34.0)
MCHC: 31.4 g/dL (ref 30.0–36.0)
MCV: 89.4 fL (ref 80.0–100.0)
Platelets: 388 10*3/uL (ref 150–400)
RBC: 2.74 MIL/uL — ABNORMAL LOW (ref 3.87–5.11)
RDW: 19.1 % — ABNORMAL HIGH (ref 11.5–15.5)
WBC: 23 10*3/uL — ABNORMAL HIGH (ref 4.0–10.5)
nRBC: 0 % (ref 0.0–0.2)

## 2023-01-04 LAB — GLUCOSE, CAPILLARY
Glucose-Capillary: 102 mg/dL — ABNORMAL HIGH (ref 70–99)
Glucose-Capillary: 107 mg/dL — ABNORMAL HIGH (ref 70–99)
Glucose-Capillary: 88 mg/dL (ref 70–99)
Glucose-Capillary: 97 mg/dL (ref 70–99)

## 2023-01-04 LAB — VITAMIN K1, SERUM: VITAMIN K1: 4.41 ng/mL — ABNORMAL HIGH (ref 0.10–2.20)

## 2023-01-04 LAB — AEROBIC/ANAEROBIC CULTURE W GRAM STAIN (SURGICAL/DEEP WOUND)

## 2023-01-04 LAB — HEPARIN LEVEL (UNFRACTIONATED)
Heparin Unfractionated: 0.12 IU/mL — ABNORMAL LOW (ref 0.30–0.70)
Heparin Unfractionated: 0.59 IU/mL (ref 0.30–0.70)

## 2023-01-04 LAB — CULTURE, BLOOD (ROUTINE X 2)
Special Requests: ADEQUATE
Special Requests: ADEQUATE

## 2023-01-04 LAB — PROCALCITONIN: Procalcitonin: 0.43 ng/mL

## 2023-01-04 LAB — MAGNESIUM: Magnesium: 1.8 mg/dL (ref 1.7–2.4)

## 2023-01-04 LAB — PHOSPHORUS: Phosphorus: 3.5 mg/dL (ref 2.5–4.6)

## 2023-01-04 LAB — TRIGLYCERIDES: Triglycerides: 53 mg/dL (ref <150)

## 2023-01-04 LAB — PREALBUMIN: Prealbumin: 13 mg/dL — ABNORMAL LOW (ref 18–38)

## 2023-01-04 MED ORDER — HEPARIN (PORCINE) 25000 UT/250ML-% IV SOLN
2000.0000 [IU]/h | INTRAVENOUS | Status: DC
  Administered 2023-01-04 (×2): 2000 [IU]/h via INTRAVENOUS
  Filled 2023-01-04: qty 250

## 2023-01-04 MED ORDER — KATE FARMS STANDARD 1.4 PO LIQD
360.0000 mL | ORAL | Status: DC
  Administered 2023-01-04: 360 mL
  Administered 2023-01-05: 325 mL
  Administered 2023-01-06 – 2023-01-08 (×2): 360 mL
  Filled 2023-01-04 (×6): qty 650

## 2023-01-04 MED ORDER — VITAMIN D (ERGOCALCIFEROL) 1.25 MG (50000 UNIT) PO CAPS
50000.0000 [IU] | ORAL_CAPSULE | ORAL | Status: DC
  Administered 2023-01-11: 50000 [IU] via ORAL
  Filled 2023-01-04 (×2): qty 1

## 2023-01-04 MED ORDER — ENOXAPARIN SODIUM 60 MG/0.6ML IJ SOSY: 60.0000 mg | PREFILLED_SYRINGE | Freq: Two times a day (BID) | INTRAMUSCULAR | Status: DC

## 2023-01-04 MED ORDER — DIPHENOXYLATE-ATROPINE 2.5-0.025 MG PO TABS
1.0000 | ORAL_TABLET | Freq: Four times a day (QID) | ORAL | Status: DC
  Administered 2023-01-04 – 2023-01-08 (×16): 1
  Filled 2023-01-04 (×16): qty 1

## 2023-01-04 NOTE — Progress Notes (Signed)
ANTICOAGULATION CONSULT NOTE - Follow Up Consult  Pharmacy Consult for Heparin Indication: pulmonary embolus  Allergies  Allergen Reactions   Nsaids Other (See Comments)    Crohns disease/ IBD   Chlorhexidine Gluconate Itching    Able to use CHG swabs for ports, reaction with direct skin contact   Hydromorphone Other (See Comments)    Confusion/sedation   Lactose Intolerance (Gi) Other (See Comments)    Upset stomach/diarrhea    Patient Measurements: Height: 5\' 4"  (162.6 cm) Weight: 57.8 kg (127 lb 6.8 oz) IBW/kg (Calculated) : 54.7 Heparin Dosing Weight: TBW  Vital Signs: Temp: 98.6 F (37 C) (09/26 0800) Temp Source: Bladder (09/26 0800) BP: 97/66 (09/26 0800) Pulse Rate: 99 (09/26 0800)  Labs: Recent Labs    01/02/23 0517 01/03/23 0632 01/03/23 1626 01/04/23 0500 01/04/23 0600  HGB 7.7* 7.6*  --  7.7*  --   HCT 24.7* 24.3*  --  24.5*  --   PLT 340 348  --  388  --   HEPARINUNFRC 0.31 0.23* 0.46 0.12*  --   CREATININE 0.86  --   --   --  0.64    Estimated Creatinine Clearance: 76.7 mL/min (by C-G formula based on SCr of 0.64 mg/dL).   Medications:  Infusions:   ciprofloxacin Stopped (01/04/23 1037)   heparin     metronidazole Stopped (01/04/23 1039)   micafungin (MYCAMINE) 100 mg in sodium chloride 0.9 % 100 mL IVPB 100 mg (01/04/23 1456)   phytonadione (VITAMIN K) 2 mg in dextrose 5 % 50 mL IVPB Stopped (12/28/22 1642)    Assessment: Brief note: Pharmacy is dosing heparin for new PE.   Heparin level 0.59, therapeutic on heparin 2000 units/hr Per discussion with RN, no interruptions, or bleeding complications.     Goal of Therapy:  Heparin level 0.3-0.7 units/ml Monitor platelets by anticoagulation protocol: Yes   Plan:  No bolus per previous MD order Continue heparin IV infusion at 2000 units/hr - STOP heparin on 9/27 at 6:00 AM prior to planned port removal at 12:00.  F/u post procedure anticoagulation plans.  Confirmatory Heparin level in 6  hours.  Lynann Beaver PharmD, BCPS [facility] main pharmacy [phone] 01/04/2023 6:19 PM

## 2023-01-04 NOTE — Progress Notes (Addendum)
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 Active Problems:   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   Malignant neoplasm of upper-outer quadrant of right breast in female, estrogen receptor positive (HCC)   Hypokalemia   Port-A-Cath in place   Breast cancer (HCC)   Crohn's disease of both small and large intestine with intestinal obstruction (HCC)   Leukocytosis   Chronic disease anemia   Protein-calorie malnutrition, severe (HCC)   Failure to thrive in adult   Anorexia   Cardiac arrest (HCC)   Septic shock (HCC)   SBO with strangulation s/p resection 12/14/2022   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  Underwent ex lap on 9/5 for bowel resection due to bowel ischemia # Ex lap, ileostomy and abdominal closure on 9/7 - She developed shortness and high output ostomy.  Continued fever and leukocytosis. - CT 9/16 showed large amount of ascites.  She had been on vancomycin and Zosyn at that point ID was engaged.  Micafungin was added on 9/17 for possible fungemia. - Patient has continued to feed her on ciprofloxacin, metronidazole and micafungin.  Blood and fungal cultures have been no growth so far form 9/18 9/7 paracentesis with no growth. - On 9/22 CT abdomen pelvis there is concern for early developing obstruction/peritonitis.  Repeat paracentesis on 9/23.  General surgery engaged and noted that  abdominal exam is not impressive, may need repeat blood cultures source could be lines or urine.  Repeat scan if no other source Thursday or Friday. - Patient does not have urinary symptoms.  As previously noted blood cultures and fungal cultures from 9/18 are with no growth. -As pt continues to fever would recommend all lines to remove any sources. Of note I suspect a developing obstruction/peritonitis may be contributing to fever and leukocytosis as well.  Recommendations: -D/C micafungin and blood and fungal Cx NG so far - Recommend PICC removal (Primary requested ID place order for PICC removal), PICC removal order placed . Communicated with general surgery who noted getting TPN through line. -Recommend port removal( Primary requested ID place order). Communicated with Oncologist Dr. Pamelia Hoit, ok for port removal from their perspective. General surgery plans to set up OR tomorrow for removal. -Will need 48-72 h line holiday and repeat blood Cx -Repeat blood cultures following line removal   Microbiology:   Antibiotics: Ciprofloxacin, metronidazole, micafungin Cultures: Blood 9/18 fungal blood cultures no growth   SUBJECTIVE: Resting in bed.  No new complaints. Discussed line/port  removal  recommendations with pt Interval: Tmax of 101.4 overnight, WBC 23k.  Review of Systems: Review of Systems  All other systems reviewed and are negative.    Scheduled Meds:  ascorbic acid  500 mg Per Tube BID   cholecalciferol  400 Units Per Tube Daily   diphenoxylate-atropine  1 tablet Per Tube QID   famotidine  20 mg Per Tube Daily   feeding supplement (  [employer] STANDARD 1.4)  325 mL Oral BID BM   feeding supplement ([employer] STANDARD 1.4)  360 mL Per Tube Q24H   ferrous sulfate  300 mg Per Tube BID   fiber  1 packet Per Tube TID   gabapentin  200 mg Per Tube Q8H   insulin aspart  0-9 Units Subcutaneous 4 times per day   loperamide HCl  4 mg Per Tube QID   metoprolol tartrate  50 mg  Per Tube BID   mouth rinse  15 mL Mouth Rinse 4 times per day   sodium chloride flush  10-40 mL Intracatheter Q12H   Vitamin D (Ergocalciferol)  50,000 Units Oral Q7 days   vitamin E  100 Units Per Tube Daily   zinc sulfate  220 mg Per Tube Daily   Continuous Infusions:  ciprofloxacin Stopped (01/04/23 1037)   heparin 2,000 Units/hr (01/04/23 2100)   metronidazole Stopped (01/04/23 1039)   micafungin (MYCAMINE) 100 mg in sodium chloride 0.9 % 100 mL IVPB Stopped (01/04/23 1556)   phytonadione (VITAMIN K) 2 mg in dextrose 5 % 50 mL IVPB Stopped (01/04/23 1559)   PRN Meds:.acetaminophen, albuterol, artificial tears, fentaNYL (SUBLIMAZE) injection, food thickener, labetalol, lip balm, LORazepam, [DISCONTINUED] ondansetron **OR** ondansetron (ZOFRAN) IV, mouth rinse, mouth rinse, oxyCODONE, silver nitrate applicators, sodium chloride flush Allergies  Allergen Reactions   Nsaids Other (See Comments)    Crohns disease/ IBD   Chlorhexidine Gluconate Itching    Able to use CHG swabs for ports, reaction with direct skin contact   Hydromorphone Other (See Comments)    Confusion/sedation   Lactose Intolerance (Gi) Other (See Comments)    Upset stomach/diarrhea    OBJECTIVE: Vitals:   01/04/23 1800 01/04/23 2000 01/04/23 2100 01/04/23 2150  BP: (!) 128/90 107/65 127/76   Pulse: (!) 126 (!) 125 (!) 129 (!) 128  Resp: 20 (!) 25 20   Temp:  99.6 F (37.6 C)    TempSrc:  Oral    SpO2: 98% 100% 100%   Weight:      Height:       Body mass index is 21.87 kg/m.  Physical Exam Constitutional:      Appearance: Normal appearance.  HENT:     Head: Normocephalic and atraumatic.     Right Ear: Tympanic membrane normal.     Left Ear: Tympanic membrane normal.     Nose: Nose normal.     Mouth/Throat:     Mouth: Mucous membranes are moist.  Eyes:     Extraocular Movements: Extraocular movements intact.     Conjunctiva/sclera: Conjunctivae normal.     Pupils: Pupils are equal, round, and  reactive to light.  Cardiovascular:     Rate and Rhythm: Normal rate and regular rhythm.     Heart sounds: No murmur heard.    No friction rub. No gallop.  Pulmonary:     Effort: Pulmonary effort is normal.     Breath sounds: Normal breath sounds.  Abdominal:     Comments: Abdominal wound  Musculoskeletal:        General: Normal range of motion.  Skin:    General: Skin is warm and dry.  Neurological:     General: No focal deficit present.     Mental Status: She is alert and oriented to person, place, and time.  Psychiatric:        Mood and Affect: Mood normal.       Lab Results Lab Results  Component Value Date  WBC 23.0 (H) 01/04/2023   HGB 7.7 (L) 01/04/2023   HCT 24.5 (L) 01/04/2023   MCV 89.4 01/04/2023   PLT 388 01/04/2023    Lab Results  Component Value Date   CREATININE 0.64 01/04/2023   BUN 27 (H) 01/04/2023   NA 134 (L) 01/04/2023   K 4.2 01/04/2023   CL 101 01/04/2023   CO2 25 01/04/2023    Lab Results  Component Value Date   ALT 30 01/04/2023   AST 44 (H) 01/04/2023   ALKPHOS 196 (H) 01/04/2023   BILITOT 0.7 01/04/2023        Danelle Earthly, MD [facility] 01/04/2023, 10:02 PM I have personally spent 53 minutes involved in face-to-face and non-face-to-face activities for this patient on the day of the visit. Professional time spent includes the following activities: Preparing to see the patient (review of tests), Obtaining and/or reviewing separately obtained history (admission/discharge record), Performing a medically appropriate examination and/or evaluation , Ordering medications/tests/procedures, referring and communicating with other health care professionals, Documenting clinical information in the EMR, Independently interpreting results (not separately reported), Communicating results to the patient/family/caregiver, Counseling and educating the patient/family/caregiver and Care coordination  (not separately reported).

## 2023-01-04 NOTE — Progress Notes (Signed)
PROGRESS NOTE    [name]  ZOX:[id number] DOB: January 25, 1976 DOA: 12/12/2022 PCP: Ollen Bowl, MD   Brief Narrative:  [name] is a 47 y.o. female with PMH significant for Crohn's disease with previous bowel obstruction s/p subtotal colectomy 2021, colonic stricture, IDA, breast cancer s/p mastectomy 2022 currently not on treatment. Recently hospitalized 8/10-8/15/2024 for bowel obstruction seen by GI and general surgery. Underwent EGD and flexible sigmoidoscopy and discharged on steroids.   9/3, patient presented to the ED with abdominal distention, pain, nausea, vomiting CT abd/pelvis showed markedly dilated distal small bowel consistent with chronic obstruction. No changes since prior study.  Patient was admitted to [facility] with a plan of GI consultation.   9/4, in the morning, within few hours of admission, patient was found pulseless on the floor in the bathroom in ED, estimated downtime about 5 minutes. ACLS was initiated. Concern for possible torsades de pointes and given IV mag, defibrillated.  ROSC achieved in about 5 minutes. She was intubated. She had copious vomitus in the ETT.  Notably, she had I-stat peri-code which showed hemoglobin of 5.5 down from 10 overnight.  Admitted to ICU In the ICU, patient had right femoral CVC placed.  Given 3 minutes of PRBC, 3 units of FFP.  Patient was started on vasopressors   9/5, underwent exploratory laparotomy.  Noted to have internal hernia with associated small bowel ischemia.  Underwent bowel resection. 9/7, back to OR for ostomy placement 9/8, weaned off Levophed 9/10, extubated 9/10, found to have PE in CT angio chest.  Started on heparin drip which was subsequently stopped the next day because of drop in hemoglobin 9/12, mental status improved, became more interactive.  However failed swallow eval. 9/16, transferred out to [facility] 9/16, CT abdomen showed peritoneal fluid collection 9/17, underwent paracentesis, 500 cc of  clear yellow fluid drained 9/22, CT abdomen raised concern of early developing obstruction, peritonitis.  9/23, paracentesis by IR.  Drained 90 cc of turbid, amber/blood-tinged fluid   Patient's hospitalization has been prolonged because of persistent fever, persistent high output from ostomy, anemia requiring blood transfusion, dysphagia.  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 Hillery Hunter s/p ex lap, ileostomy, abdominal closure 9/7 Dr. Hillery Hunter Currently only has 100 cm of small bowel left and hence started syndrome.  Continues to have significant ostomy output.   TPN dependent To slow down ostomy output, patient is currently on Imodium, iron, FiberCon, Imodium.  Surgery following and managing.   Dysphagia Postextubation, patient failed speech therapy  reevaluation multiple amps.  Gradually improving and currently on dysphagia 3 diet, tube feeding during the day and TPN during the night.    Persistent sepsis Patient continues to have persistent fever,  tachycardia and leukocytosis Extensive workup done.  9/19, ID consulted.  Currently on ciprofloxacin, Flagyl and micafungin. 9/22, CT abdomen raised concern of early developing obstruction, peritonitis. 9/23, paracentesis by IR.  Drained 90 cc of turbid, amber/blood-tinged fluid, culture negative.  Seen by ID 01/03/2023, they recommend line holiday.  They are going to place order for line removal today and patient is going to require the line again to be placed back in 2 to 3 days for continuation of the TPN.  Her temperature is also low-grade, last Tmax has been 100.4 at 7 AM 01/04/2023.  Leukocytosis is worse today.  I am checking procalcitonin.  Appreciate ID help.  Repeat blood culture obtained 01/03/2023.  Persistent tachycardia Primarily due to sepsis but also contributed by effective hypovolemia and pulm embolism.  BP on the lower normal side. Currently on Metropol 50 mg twice daily.  Tachycardia is better today.   AKI Likely due to ATN related to septic shock.  Resolved.  Pulmonary embolism Left lower lobe segmental PE diagnosed on 9/10, initially started on heparin which was stopped after 1 day due to anemia but then resumed on 12/30/2022.   Acute on chronic anemia Hemoglobin trend as below.  Received a total of 9 units of PRBC so far. 9/21, heparin drip was resumed.  Hemoglobin fairly stable since 12/31/2022.  No active bleeding.  Continue to monitor  Hyperglycemia A1c 5.7 on 9//24 Continue SSI/Accu-Cheks  Loss of voice/hoarse voice Postextubation, patient was noted to have dysphagia as well as loss of voice. If no improvement, we'll involve ENT after acute issues resolve however per patient, her voice is improving but very slowly.   Recurrent breast cancer: Follows up with oncology Dr. Pamelia Hoit   DVT prophylaxis: Place and maintain sequential compression device Start: 12/15/22 1031 SCDs Start: 12/13/22 0413   Code Status: Full Code  Family Communication:  None present at  bedside.  Plan of care discussed with patient in length and he/she verbalized understanding and agreed with it.  Status is: Inpatient Remains inpatient appropriate because: Patient is still very sick with multiple problems.  She is TPN and tube feed dependent.   Estimated body mass index is 21.87 kg/m as calculated from the following:   Height as of this encounter: 5\' 4"  (1.626 m).   Weight as of this encounter: 57.8 kg.    Nutritional Assessment: Body mass index is 21.87 kg/m.Marland Kitchen Seen by dietician.  I agree with the assessment and plan as outlined below: Nutrition Status: Nutrition Problem: Moderate Malnutrition Etiology: chronic illness (Crohn's disease; metastatic breast cancer) Signs/Symptoms: moderate fat depletion, moderate muscle depletion, percent weight loss (24% in 4.5 months) Percent weight loss: 24 % (in 4.5 months) Interventions: Tube feeding, TPN  . Skin Assessment: I have examined the patient's skin and I agree with the wound assessment as performed by the wound care RN as outlined below:    Consultants:  ID and general surgery  Procedures:  As above  Antimicrobials:  Anti-infectives (From admission, onward)    Start     Dose/Rate Route Frequency Ordered Stop   12/29/22 1215  ciprofloxacin (CIPRO) IVPB 400 mg        400 mg 200 mL/hr over 60 Minutes Intravenous 2 times daily 12/29/22 1123     12/29/22 1215  metroNIDAZOLE (FLAGYL) IVPB  500 mg        500 mg 100 mL/hr over 60 Minutes Intravenous 2 times daily 12/29/22 1123     12/28/22 1430  micafungin (MYCAMINE) 100 mg in sodium chloride 0.9 % 100 mL IVPB        100 mg 105 mL/hr over 1 Hours Intravenous Every 24 hours 12/28/22 1335     12/23/22 2000  vancomycin (VANCOREADY) IVPB 1250 mg/250 mL  Status:  Discontinued        1,250 mg 166.7 mL/hr over 90 Minutes Intravenous Every 36 hours 12/22/22 0612 12/22/22 1103   12/22/22 0645  vancomycin (VANCOREADY) IVPB 1250 mg/250 mL        1,250 mg 166.7 mL/hr over  90 Minutes Intravenous  Once 12/22/22 0551 12/22/22 0805   12/22/22 0600  piperacillin-tazobactam (ZOSYN) IVPB 3.375 g  Status:  Discontinued        3.375 g 12.5 mL/hr over 240 Minutes Intravenous Every 8 hours 12/22/22 0551 12/29/22 1123   12/13/22 1000  piperacillin-tazobactam (ZOSYN) IVPB 3.375 g  Status:  Discontinued        3.375 g 12.5 mL/hr over 240 Minutes Intravenous Every 8 hours 12/13/22 0833 12/20/22 1333         Subjective: Patient is seen and examined.  She has no new complaint.  Objective: Vitals:   01/04/23 0600 01/04/23 0644 01/04/23 0700 01/04/23 0800  BP: 122/75 115/80 111/71 97/66  Pulse: (!) 130 (!) 131 (!) 111 99  Resp: (!) 22 19 15 14   Temp: (!) 100.8 F (38.2 C) (!) 100.4 F (38 C) 99.9 F (37.7 C) 98.6 F (37 C)  TempSrc:    Bladder  SpO2: 100% 99% 100% 100%  Weight:      Height:        Intake/Output Summary (Last 24 hours) at 01/04/2023 1104 Last data filed at 01/04/2023 1048 Gross per 24 hour  Intake 4356.15 ml  Output 4050 ml  Net 306.15 ml   Filed Weights   01/02/23 0500 01/03/23 0500 01/04/23 0500  Weight: 54.9 kg 59.1 kg 57.8 kg    Examination:  General exam: Appears calm and comfortable but deconditioned Respiratory system: Clear to auscultation. Respiratory effort normal. Cardiovascular system: S1 & S2 heard, RRR. No JVD, murmurs, rubs, gallops or clicks. No pedal edema. Gastrointestinal system: Abdomen is nondistended, mildly firm and tender generally, has ileostomy bag with stool in it. No organomegaly or masses felt. Normal bowel sounds heard. Central nervous system: Alert and oriented. No focal neurological deficits. Extremities: Symmetric 5 x 5 power. Skin: No rashes, lesions or ulcers.  Psychiatry: Judgement and insight appear normal. Mood & affect appropriate.   Data Reviewed: I have personally reviewed following labs and imaging studies  CBC: Recent Labs  Lab 01/01/23 0456 01/01/23 1110 01/02/23 0517 01/03/23 0632  01/04/23 0500  WBC 17.6* 18.1* 18.6* 20.1* 23.0*  HGB 7.5* 7.8* 7.7* 7.6* 7.7*  HCT 24.0* 24.2* 24.7* 24.3* 24.5*  MCV 90.2 88.3 90.8 90.7 89.4  PLT 317 314 340 348 388   Basic Metabolic Panel: Recent Labs  Lab 12/30/22 0514 12/31/22 0504 01/01/23 0456 01/01/23 0617 01/02/23 0517 01/04/23 0500 01/04/23 0600  NA 136 134* 135 133* 138  --  134*  K 4.1 4.3 3.8 3.8 4.2  --  4.2  CL 102 101 102 101 103  --  101  CO2 25 25 25 27 26   --  25  GLUCOSE 129* 121* 149* 121* 121*  --  129*  BUN 40*  35* 33* 34* 33*  --  27*  CREATININE 1.06* 0.72 1.01* 0.94 0.86  --  0.64  CALCIUM 7.8* 8.1* 7.2* 7.2* 8.0*  --  8.0*  MG 1.9 2.0 1.9  --   --  1.8  --   PHOS 2.9 2.9 3.1  --   --  3.5  --    GFR: Estimated Creatinine Clearance: 76.7 mL/min (by C-G formula based on SCr of 0.64 mg/dL). Liver Function Tests: Recent Labs  Lab 12/30/22 0514 01/01/23 0617 01/04/23 0600  AST 24 32 44*  ALT 23 26 30   ALKPHOS 149* 148* 196*  BILITOT 0.6 0.4 0.7  PROT 5.1* 5.6* 6.2*  ALBUMIN <1.5* <1.5* 1.5*   No results for input(s): "LIPASE", "AMYLASE" in the last 168 hours. No results for input(s): "AMMONIA" in the last 168 hours. Coagulation Profile: Recent Labs  Lab 12/30/22 1338  INR 1.3*   Cardiac Enzymes: No results for input(s): "CKTOTAL", "CKMB", "CKMBINDEX", "TROPONINI" in the last 168 hours. BNP (last 3 results) No results for input(s): "PROBNP" in the last 8760 hours. HbA1C: No results for input(s): "HGBA1C" in the last 72 hours. CBG: Recent Labs  Lab 01/03/23 0805 01/03/23 1119 01/03/23 2011 01/03/23 2352 01/04/23 0726  GLUCAP 100* 116* 142* 113* 107*   Lipid Profile: Recent Labs    01/04/23 0500  TRIG 53   Thyroid Function Tests: No results for input(s): "TSH", "T4TOTAL", "FREET4", "T3FREE", "THYROIDAB" in the last 72 hours. Anemia Panel: No results for input(s): "VITAMINB12", "FOLATE", "FERRITIN", "TIBC", "IRON", "RETICCTPCT" in the last 72 hours. Sepsis Labs: Recent  Labs  Lab 01/03/23 1257 01/04/23 0924  PROCALCITON  --  0.43  LATICACIDVEN 1.2  --     Recent Results (from the past 240 hour(s))  Aerobic/Anaerobic Culture w Gram Stain (surgical/deep wound)     Status: None   Collection Time: 12/26/22  3:07 PM   Specimen: PATH Cytology Peritoneal fluid  Result Value Ref Range Status   Specimen Description   Final    PERITONEAL Performed at [facility], [address], [place], [place] [address]    Special Requests   Final    NONE Performed at [facility], [address] [place], [place] [address]    Gram Stain   Final    FEW WBC PRESENT,BOTH PMN AND MONONUCLEAR NO ORGANISMS SEEN    Culture   Final    No growth aerobically or anaerobically. Performed at [facility], [address], [place], [place] [address]    Report Status 12/31/2022 FINAL  Final  Fungus culture, blood     Status: None   Collection Time: 12/27/22  1:07 PM   Specimen: BLOOD  Result Value Ref Range Status   Specimen Description   Final    BLOOD SITE NOT SPECIFIED Performed at [facility], [address], [place], [place] [address]    Special Requests   Final    BOTTLES DRAWN AEROBIC ONLY Blood Culture adequate volume Performed at [facility], [address], [place], [place] [address]    Culture   Final    NO GROWTH 7 DAYS NO FUNGUS ISOLATED Performed at [facility], [address], [place], [place] [address]    Report Status 01/03/2023 FINAL  Final  Culture, blood (Routine X 2) w Reflex to ID Panel     Status: None   Collection Time: 12/27/22  1:07 PM   Specimen: BLOOD RIGHT ARM  Result Value Ref Range Status   Specimen Description   Final    BLOOD RIGHT ARM Performed at [facility], [address] [place], [place] [address]    Special Requests   Final    BOTTLES DRAWN AEROBIC AND ANAEROBIC Blood Culture adequate volume Performed at [facility], [address], [place], [place] [address]    Culture   Final    NO GROWTH 5 DAYS Performed at [facility], [address], [place], [place] [address]    Report Status 01/01/2023 FINAL  Final  Culture, blood (Routine X 2) w Reflex to ID Panel     Status: None   Collection Time: 12/27/22  1:07 PM   Specimen: BLOOD LEFT ARM  Result Value Ref Range Status   Specimen Description   Final    BLOOD LEFT ARM Performed at [facility], [address], [place], [place] [address]    Special Requests   Final    BOTTLES DRAWN AEROBIC ONLY Blood Culture adequate volume Performed at [facility], [address], [place], [place] [address]    Culture   Final    NO GROWTH 5 DAYS Performed at [facility], [address] [place], [place] [address]    Report Status 01/01/2023 FINAL  Final  Culture, fungus without smear     Status: None (Preliminary result)   Collection Time: 01/01/23 11:52 AM   Specimen: Peritoneal Washings; Other  Result Value Ref Range Status   Specimen Description   Final    PERITONEAL Performed at [facility], [address] [place], [place] [address]    Special Requests   Final    NONE Performed at [facility], [address] [place], [place] [address]    Culture   Final    NO FUNGUS ISOLATED AFTER 2 DAYS Performed at [facility], [address], [place], [place] [address]    Report Status PENDING  Incomplete  Acid Fast Smear (AFB)     Status: None   Collection Time: 01/01/23 11:52 AM   Specimen: Peritoneal  Result Value Ref Range Status   AFB Specimen Processing Concentration  Final   Acid Fast Smear Negative  Final    Comment: (NOTE) Performed At: [facility] [address] [place], [place] [id number] Jolene Schimke MD ZD:[id number]    Source (AFB) NONE  Final    Comment: Performed at [facility], [address] [place], [place]  [address]  Aerobic/Anaerobic Culture w Gram Stain (surgical/deep wound)     Status: None (Preliminary result)   Collection Time: 01/01/23 11:52 AM   Specimen: Peritoneal Washings  Result Value Ref Range Status   Specimen Description   Final    PERITONEAL Performed at [facility], [address] [place], [place] [address]    Special Requests   Final    NONE Performed at [facility], [address], [place], [place] [address]    Gram Stain   Final    ABUNDANT WBC PRESENT, PREDOMINANTLY MONONUCLEAR NO ORGANISMS SEEN    Culture   Final    NO GROWTH 3 DAYS NO ANAEROBES ISOLATED; CULTURE IN PROGRESS FOR 5 DAYS Performed at [facility], [address], [place], [place] [address]    Report Status PENDING  Incomplete  Culture, blood (Routine X 2) w Reflex to ID Panel  Status: None (Preliminary result)   Collection Time: 01/03/23  5:32 PM   Specimen: BLOOD  Result Value Ref Range Status   Specimen Description   Final    BLOOD PICC LINE Performed at [facility], [address] [place] [address], [place], [place] [address]    Special Requests   Final    BOTTLES DRAWN AEROBIC AND ANAEROBIC Blood Culture adequate volume Performed at [facility], [address] [place], [place] [address]    Culture   Final    NO GROWTH < 24 HOURS Performed at [facility], [address], [place], [place] [address]    Report Status PENDING  Incomplete  Culture, blood (Routine X 2) w Reflex to ID Panel     Status: None (Preliminary result)   Collection Time: 01/03/23  5:32 PM   Specimen: BLOOD LEFT FOREARM  Result Value Ref Range Status   Specimen Description   Final    BLOOD LEFT FOREARM Performed at [facility], [address], [place], [place] [address]    Special Requests   Final    BOTTLES DRAWN AEROBIC AND ANAEROBIC Blood Culture adequate volume Performed at [facility], [address]  [place], [place] [address]    Culture   Final    NO GROWTH < 24 HOURS Performed at [facility], [address], [place], [place] [address]    Report Status PENDING  Incomplete     Radiology Studies: DG CHEST PORT 1 VIEW  Result Date: 01/03/2023 CLINICAL DATA:  10031 Cough 786.2.ICD-9-CM E6361829 Fever [id number] EXAM: PORTABLE CHEST 1 VIEW COMPARISON:  None Available. FINDINGS: The heart size and mediastinal contours are within normal limits. Left IJ approach Port-A-Cath with the tip projecting near the superior cavoatrial junction. Right subclavian approach central venous catheter with its tip near the superior cavoatrial junction also. Enteric tube courses below the diaphragm with the tip in the third portion of the duodenum. Improved aeration of the lung bases within minimal streaky left basilar opacities. No visible pleural effusions or pneumothorax. No acute osseous abnormality. Left breast implant. Improved aeration lung IMPRESSION: Bases with minimal streaky left basilar opacities. Electronically Signed   By: Feliberto Harts M.D.   On: 01/03/2023 14:31    Scheduled Meds:  ascorbic acid  500 mg Per Tube BID   cholecalciferol  400 Units Per Tube Daily   diphenoxylate-atropine  1 tablet Per Tube QID   enoxaparin (LOVENOX) injection  60 mg Subcutaneous BID   famotidine  20 mg Per Tube Daily   feeding supplement (KATE FARMS STANDARD 1.4)  325 mL Oral BID BM   feeding supplement (KATE FARMS STANDARD 1.4)  360 mL Per Tube Q24H   ferrous sulfate  300 mg Per Tube BID   fiber  1 packet Per Tube TID   gabapentin  200 mg Per Tube Q8H   insulin aspart  0-9 Units Subcutaneous 4 times per day   loperamide HCl  4 mg Per Tube QID   metoprolol tartrate  50 mg Per Tube BID   mouth rinse  15 mL Mouth Rinse 4 times per day   sodium chloride flush  10-40 mL Intracatheter Q12H   vitamin E  100 Units Per Tube Daily   zinc sulfate  220 mg Per Tube Daily   Continuous Infusions:  ciprofloxacin Stopped  (01/04/23 1037)   metronidazole Stopped (01/04/23 1039)   micafungin (MYCAMINE) 100 mg in sodium chloride 0.9 % 100  mL IVPB Stopped (01/03/23 1710)   phytonadione (VITAMIN K) 2 mg in dextrose 5 % 50 mL IVPB Stopped (12/28/22 1642)     LOS: 22 days   Hughie Closs, MD [facility]  01/04/2023, 11:04 AM   *Please note that this is a verbal dictation therefore any spelling or grammatical errors are due to the "Dragon [employer] One" system interpretation.  Please page via Amion and do not message via secure chat for urgent patient care matters. Secure chat can be used for non urgent patient care matters.  How to contact the [facility] Attending or Consulting provider 7A - 7P or covering provider during after hours 7P -7A, for this patient?  Check the care team in [facility] and look for a) attending/consulting TRH provider listed and b) the [facility] team listed. Page or secure chat 7A-7P. Log into [email or web address] and use [place]'s universal password to access. If you do not have the password, please contact the hospital operator. Locate the [facility] provider you are looking for under [facility] and page to a number that you can be directly reached. If you still have difficulty reaching the provider, please page the [facility] (Director on Call) for the Hospitalists listed on amion for assistance.

## 2023-01-04 NOTE — Progress Notes (Signed)
Physical Therapy Treatment Patient Details Name: [name] MRN: [id number] DOB: [birth year] Today's Date: 01/04/2023   History of Present Illness Patient is a 47 year old female who presented on 9/3 with abdominal distension and nausea/vomiting. 9/4 patient was found in ED bathroom with no pulse with ROSC in about 5 mins. Patient was intubated at this time. 9/5 patient underwent exploratory laparotomy that revealed internal hernia with associated small bowel ischemia with patient undergoing bowel resection. 9/7 patient returned to OR for ostomy placement. Patient was extubated on 9/10 and also found to have PE. PMH: breast cancer, Chron's disease, IDA, recent hospitalization 8/10 to 8/15 with bowel obstruction and EGD    PT Comments  Pt seen in ICU RM # [id number] Pt is AxO x 3 pleasant and willing.  Assisted OOB required increased time and effort.  General bed mobility comments: First pperformed partially rolling to apply ABD binder then assist to raise trunk and pivot hips to EOB, VCs for technique. use of rail. General transfer comment: pt able to rise at [facility] Assist with VC's on proper hand placement.  Used B platform EVA walker this session to increase amb distance and upright activity. General Gait Details: used B platform EVA walker this session due to prfound weakness and extended length of stay.  Pt tolerated amb 22 feet then another 6 feet after an extended seated rest break.  RA 99% and HR 102 Per chart review, pt is NOT a CIR candidate.  Will update LPT.  Pt may need SNF.   If plan is discharge home, recommend the following: A lot of help with walking and/or transfers;A lot of help with bathing/dressing/bathroom;Assistance with cooking/housework;Assist for transportation   Can travel by private vehicle     No  Equipment Recommendations  Rolling walker (2 wheels)    Recommendations for Other Services       Precautions / Restrictions Precautions Precautions: Fall Precaution  Comments: abdominal precautions, wound vac, abominal binder, ostomy, nasal tube Restrictions Weight Bearing Restrictions: No     Mobility  Bed Mobility Overal bed mobility: Needs Assistance Bed Mobility: Supine to Sit Rolling: Min assist, Mod assist         General bed mobility comments: First pperformed partially rolling to apply ABD binder then assist to raise trunk and pivot hips to EOB, VCs for technique. use of rail.    Transfers Overall transfer level: Needs assistance Equipment used: Bilateral platform walker Transfers: Sit to/from Stand Sit to Stand: Min assist, +2 safety/equipment           General transfer comment: pt able to rise at [facility] Assist with VC's on proper hand placement.  Used B platform EVA walker this session to increase amb distance and upright activity.    Ambulation/Gait Ambulation/Gait assistance: Mod assist Gait Distance (Feet): 44 Feet Assistive device: Fara Boros Gait Pattern/deviations: Step-through pattern, Decreased stride length, Narrow base of support Gait velocity: decr     General Gait Details: used B platform EVA walker this session due to prfound weakness and extended length of stay.  Pt tolerated amb 22 feet then another 6 feet after an extended seated rest break.  RA 99% and HR 102   Stairs             Wheelchair Mobility     Tilt Bed    Modified Rankin (Stroke Patients Only)       Balance  Cognition Arousal: Alert Behavior During Therapy: WFL for tasks assessed/performed Overall Cognitive Status: Within Functional Limits for tasks assessed                                 General Comments: AxO x 3 pleasant and willing.  Soft to no voice        Exercises      General Comments        Pertinent Vitals/Pain Pain Assessment Pain Assessment: No/denies pain    Home Living                          Prior Function             PT Goals (current goals can now be found in the care plan section) Progress towards PT goals: Progressing toward goals    Frequency    Min 1X/week      PT Plan      Co-evaluation              AM-PAC PT "6 Clicks" Mobility   Outcome Measure  Help needed turning from your back to your side while in a flat bed without using bedrails?: A Little Help needed moving from lying on your back to sitting on the side of a flat bed without using bedrails?: A Lot Help needed moving to and from a bed to a chair (including a wheelchair)?: A Lot Help needed standing up from a chair using your arms (e.g., wheelchair or bedside chair)?: A Lot Help needed to walk in hospital room?: A Lot Help needed climbing 3-5 steps with a railing? : Total 6 Click Score: 12    End of Session Equipment Utilized During Treatment: Gait belt Activity Tolerance: Patient tolerated treatment well Patient left: in chair;with call bell/phone within reach Nurse Communication: Mobility status PT Visit Diagnosis: Unsteadiness on feet (R26.81);Muscle weakness (generalized) (M62.81);Difficulty in walking, not elsewhere classified (R26.2)     Time: 1110-1140 PT Time Calculation (min) (ACUTE ONLY): 30 min  Charges:    $Gait Training: 8-22 mins $Therapeutic Activity: 8-22 mins PT General Charges $$ ACUTE PT VISIT: 1 Visit                     {Orlen Leedy  PTA Acute  Rehabilitation Services Office M-F          [phone]

## 2023-01-04 NOTE — Progress Notes (Addendum)
Progress Note  19 Days Post-Op  Subjective: Ongoing low grade temps. She denies abdominal pain. She denies any vomiting in the last 36 hours.  WBC up to 23  Objective: Vital signs in last 24 hours: Temp:  [98.6 F (37 C)-100.8 F (38.2 C)] 98.6 F (37 C) (09/26 0800) Pulse Rate:  [99-131] 99 (09/26 0800) Resp:  [14-24] 14 (09/26 0800) BP: (92-148)/(63-81) 97/66 (09/26 0800) SpO2:  [99 %-100 %] 100 % (09/26 0800) Weight:  [57.8 kg] 57.8 kg (09/26 0500) Last BM Date : 01/04/23  Intake/Output from previous day: 09/25 0701 - 09/26 0700 In: 3954.8 [I.V.:2057.5; NG/GT:190; IV Piggyback:1707.3] Out: 4750 [Urine:3100; Stool:1650] Intake/Output this shift: Total I/O In: 55.3 [I.V.:55.3] Out: -   PE: Gen:  Alert, pleasant Card:  Tachycardic 100 bpm Chest wall - L chest wall port without cellulitis, R subclavian central line without cellultiis  Pulm: Rate and effort normal Abd: Soft, non-tender, overall nondistended, LLQ ileostomy viable, to gravity. 1650 mL/24h Midline incision:  VAC in place holding suction. GU: Foley draining straw colored urine.    Lab Results:  Recent Labs    01/03/23 0632 01/04/23 0500  WBC 20.1* 23.0*  HGB 7.6* 7.7*  HCT 24.3* 24.5*  PLT 348 388   BMET Recent Labs    01/02/23 0517 01/04/23 0600  NA 138 134*  K 4.2 4.2  CL 103 101  CO2 26 25  GLUCOSE 121* 129*  BUN 33* 27*  CREATININE 0.86 0.64  CALCIUM 8.0* 8.0*   PT/INR No results for input(s): "LABPROT", "INR" in the last 72 hours.  CMP     Component Value Date/Time   NA 134 (L) 01/04/2023 0600   NA 135 01/21/2020 1056   K 4.2 01/04/2023 0600   CL 101 01/04/2023 0600   CO2 25 01/04/2023 0600   GLUCOSE 129 (H) 01/04/2023 0600   BUN 27 (H) 01/04/2023 0600   BUN 12 01/21/2020 1056   CREATININE 0.64 01/04/2023 0600   CREATININE 0.91 09/21/2022 1141   CALCIUM 8.0 (L) 01/04/2023 0600   PROT 6.2 (L) 01/04/2023 0600   ALBUMIN 1.5 (L) 01/04/2023 0600   AST 44 (H) 01/04/2023  0600   AST 18 09/21/2022 1141   ALT 30 01/04/2023 0600   ALT 12 09/21/2022 1141   ALKPHOS 196 (H) 01/04/2023 0600   BILITOT 0.7 01/04/2023 0600   BILITOT 0.4 09/21/2022 1141   GFRNONAA >60 01/04/2023 0600   GFRNONAA >60 09/21/2022 1141   GFRAA 108 01/21/2020 1056   Lipase     Component Value Date/Time   LIPASE 762 (H) 12/21/2022 0305       Studies/Results: DG CHEST PORT 1 VIEW  Result Date: 01/03/2023 CLINICAL DATA:  10031 Cough 786.2.ICD-9-CM E6361829 Fever [id number] EXAM: PORTABLE CHEST 1 VIEW COMPARISON:  None Available. FINDINGS: The heart size and mediastinal contours are within normal limits. Left IJ approach Port-A-Cath with the tip projecting near the superior cavoatrial junction. Right subclavian approach central venous catheter with its tip near the superior cavoatrial junction also. Enteric tube courses below the diaphragm with the tip in the third portion of the duodenum. Improved aeration of the lung bases within minimal streaky left basilar opacities. No visible pleural effusions or pneumothorax. No acute osseous abnormality. Left breast implant. Improved aeration lung IMPRESSION: Bases with minimal streaky left basilar opacities. Electronically Signed   By: Feliberto Harts M.D.   On: 01/03/2023 14:31    Anti-infectives: Anti-infectives (From admission, onward)    Start  Dose/Rate Route Frequency Ordered Stop   12/29/22 1215  ciprofloxacin (CIPRO) IVPB 400 mg        400 mg 200 mL/hr over 60 Minutes Intravenous 2 times daily 12/29/22 1123     12/29/22 1215  metroNIDAZOLE (FLAGYL) IVPB 500 mg        500 mg 100 mL/hr over 60 Minutes Intravenous 2 times daily 12/29/22 1123     12/28/22 1430  micafungin (MYCAMINE) 100 mg in sodium chloride 0.9 % 100 mL IVPB        100 mg 105 mL/hr over 1 Hours Intravenous Every 24 hours 12/28/22 1335     12/23/22 2000  vancomycin (VANCOREADY) IVPB 1250 mg/250 mL  Status:  Discontinued        1,250 mg 166.7 mL/hr over 90 Minutes  Intravenous Every 36 hours 12/22/22 0612 12/22/22 1103   12/22/22 0645  vancomycin (VANCOREADY) IVPB 1250 mg/250 mL        1,250 mg 166.7 mL/hr over 90 Minutes Intravenous  Once 12/22/22 0551 12/22/22 0805   12/22/22 0600  piperacillin-tazobactam (ZOSYN) IVPB 3.375 g  Status:  Discontinued        3.375 g 12.5 mL/hr over 240 Minutes Intravenous Every 8 hours 12/22/22 0551 12/29/22 1123   12/13/22 1000  piperacillin-tazobactam (ZOSYN) IVPB 3.375 g  Status:  Discontinued        3.375 g 12.5 mL/hr over 240 Minutes Intravenous Every 8 hours 12/13/22 1610 12/20/22 1333        Assessment/Plan  Hx Crohn's disease s/p subtotal colectomy  Internal hernia with bowel ischemia - POD 21 exploratory laparotomy with small bowel resection 9/5 Dr. Hillery Hunter - POD 19  s/p exploratory laparotomy, ileostomy, abdominal closure 9/7 Dr. 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 Highly likely to have high ostomy output  - ID requesting line holiday 9/26. That is ok but she will need to resume TPN as soon as possible (48-72h). Pre-alb 11 and Albumin < 1.5 on 9/19 - Imodium 4mg  QID, Iron 325mg  BID, Fibercon 625mg  TID. Added lomotil increased today to 1 mg QID. - DYS 3 diet, nocturnal TF stopped 9/25. Will resume nocturnal TF while off of TPN with plans to stop them if PO intake improves. - Diet per SLP. DYS3 - poor PO intake at current, PO supplements per dietician. - WOCN following for new ostomy. Plan ostomy clinic referral at d/c - Midline wound with hx of bleeding. Resolved. Cont VAC M/W/F with white foam in base inferiorly. Healing well. - CT A/P 9/16 done. No indication for return to the OR. Low suspicion for bowel ischemia currently. Ascites without signs of infection on para. Lactate 9/25 WNL. If ongoing leukocytosis without any other source then will consider repeat CT A/P in 24-48h.  - ABx and anti-fungals per ID - PT/OT - Palliative following  peripherally currently.   Ongoing fever of unclear etiology. Low suspicion for abdominal source. CXR 9/25 ok, BCx 9/18 NGTD, peritoneal cultures 9/17 and 9/23 NGTD. Repeat BCx 9/25 pending. ID following. They request line holiday. Also ok to D/C foley from [facility] standpoint.  ID - Zosyn 9/4>>9/11. 9/13 >> Micafungin 9/19 >> Patient still febrile with elevated leukocytosis.  LE [place] 9/18 neg. UE [place] 9/19 neg. UA neg. CXR w/ L > R opacities with b/l pleural effusions. Defer to [facility] if pleural effusions need further addressing. Resp Cx ordered. Bcx including specific fungal blood cx NGTD.    FEN - DYS 3  per SLP as above. Cyclic TPN, nocturnal TF @ 30 mL/hr; s/p 1,000 mL NS bolus 9/25 VTE - SCDs, Hep gtt Foley - Failed TOV. Foley in place with good UOP.     - per [facility] - PE - Noted on CTA 9/10. LE [place] negative for DVT. Heparin gtt. Hgb 7.5 from 8.6, agree with PM CBC. Agree with CCM that we may need to consider IVC temporary filter at some point if unable to restart heparin gtt (hx breast ca, recent surgery, immobilized). Shock - off pressors VDRF - extubated  Recent cardiac arrest 9/3 AKI - Cr stable on most recent labs Anemia - Transfuse to keep hemoglobin above 7. Consider IV iron supplementation - she has short gut so worried she will not absorb iron well orally. Last PT-INR 9/15 was 1.3.  Hx metastatic breast cancer   LOS: 22 days     Adam Phenix, [facility] Surgery 01/04/2023, 10:09 AM Please see Amion for pager number during day hours 7:00am-4:30pm

## 2023-01-04 NOTE — Progress Notes (Signed)
ANTICOAGULATION CONSULT NOTE  Pharmacy Consult for heparin Indication: pulmonary embolus  Allergies  Allergen Reactions   Nsaids Other (See Comments)    Crohns disease/ IBD   Chlorhexidine Gluconate Itching    Able to use CHG swabs for ports, reaction with direct skin contact   Hydromorphone Other (See Comments)    Confusion/sedation   Lactose Intolerance (Gi) Other (See Comments)    Upset stomach/diarrhea    Patient Measurements: Height: 5\' 4"  (162.6 cm) Weight: 57.8 kg (127 lb 6.8 oz) IBW/kg (Calculated) : 54.7 Heparin Dosing Weight: TBW  Vital Signs: Temp: 98.6 F (37 C) (09/26 0800) Temp Source: Bladder (09/26 0800) BP: 97/66 (09/26 0800) Pulse Rate: 99 (09/26 0800)  Labs: Recent Labs    01/02/23 0517 01/03/23 0632 01/03/23 1626 01/04/23 0500 01/04/23 0600  HGB 7.7* 7.6*  --  7.7*  --   HCT 24.7* 24.3*  --  24.5*  --   PLT 340 348  --  388  --   HEPARINUNFRC 0.31 0.23* 0.46 0.12*  --   CREATININE 0.86  --   --   --  0.64    Estimated Creatinine Clearance: 76.7 mL/min (by C-G formula based on SCr of 0.64 mg/dL).   Medications:  No AC PTA.  Assessment: 47 yo F with a PMH of recurrent breast cancer not currently on treatment, who presented to the ED on 12/12/2022 with abdominal pain, nausea, vomiting diarrhea.   Patient was placed on heparin drip from 9/10-9/12 for new LLL PE. On 9/12 heparin drip was discontinued due to hemoglobin dropping and intermittent bleeding in ostomy.   On 9/21: Pharmacy has been consulted to resume heparin drip after transfusion (Hgb 7, transfused 1 unit PRBC)  Today 01/04/2023 Heparin level 0.12, subtherapeutic with heparin infusion at 1850 units/hr CBC stable from yesterday Per discussion with RN, no line issues or concerns Concern for possible heparin resistance given current rate of heparin at 32 units/kg/hr (and increasing), discussed with provider and plan to switch to Lovenox but pt was concerned about side effects and  wants to research first   Goal of Therapy:  Heparin level 0.3-0.7 units/ml Monitor platelets by anticoagulation protocol: Yes   Plan:  No bolus per previous MD order Increase heparin infusion to 2000 units/hr Recheck heparin level at 1800 Monitor for signs/symptoms of bleeding Follow up long-term anticoagulation plans    Pricilla Riffle, PharmD, BCPS Clinical Pharmacist 01/04/2023 2:02 PM

## 2023-01-04 NOTE — Progress Notes (Addendum)
Nutrition Follow-up  DOCUMENTATION CODES:   Non-severe (moderate) malnutrition in context of chronic illness  INTERVENTION:  - TPN to run out tonight and not be reordered for planned line holiday (tentatively 48-72 hours).    - Plan to restart once medically appropriate.   - Restart TF to 5mL/hr tonight per Surgery. Plan for nocturnal feeds x12 hours overnight. - [employer] 1.4 at 56mL/hr (1800-0600)   - Continue DYS 3 with nectar thickened liquids per SLP recs. Jae [employer] 1.4 orally BID, each supplement provides 455 kcal and 20 grams protein.    - Per Surgery, start supplementation of fat soluble vitamins D, E, and K. Checking levels to assess need for maintenance doses or repletion doses of supplementation.             - Vitamin D: LOW, alerted MD that supplementation needed, 50,000 units Q7 days x2 months.             - Vitamin E: pending, continue 100 units vitamin E daily per tube.             - Vitamin K: pending, IV Vitamin K supplementation per Surgery/Pharmacy.              - Vitamin A: pending; checking vitamin A prior to supplementation.    - Continue MVI in TPN.   - Continue 500mg  vitamin C BID and 220mg  zinc x14 days (ends 10/4) to promote wound healing.    - Daily weights while on TPN    Patient will need to be TPN dependent long term.    NUTRITION DIAGNOSIS:   Moderate Malnutrition related to chronic illness (Crohn's disease; metastatic breast cancer) as evidenced by moderate fat depletion, moderate muscle depletion, percent weight loss (24% in 4.5 months). *ongoing  GOAL:   Patient will meet greater than or equal to 90% of their needs *progressing  MONITOR:   Labs, Weight trends, TF tolerance, I & O's, Diet advancement (TPN)  REASON FOR ASSESSMENT:   Consult Enteral/tube feeding initiation and management  ASSESSMENT:   47 y.o. female with PMH of recurrent breast cancer not currently on treatment, Crohn's disease s/p subtotal colectomy in 2021,  recurrent obstructions, colonic stricture, recent hospitalization 8/10-8/15/2024 for obstruction who presented on 12/12/2022 with abdominal pain, nausea, vomiting diarrhea.  9/4 Admit; found down in ED bathroom with no pulse, intubated 9/5 s/p ex-lap, found to have internal hernia with small bowel ischemia, only ~100cm of small bowel remaining 9/7 ex-lap, I&D, end ileostomy 9/8 TPN started at 18mL/hr 9/9 TPN increased to 67mL/hr 9/10 extubated, NGT removed but then replaced. 9/12: TF stopped 9/14: small bore NGT placed for medications, MBS performed 9/18: trickle TF (46mL/hr) of Vital 1.2 started, starting fat soluble vitamin supplementation 9/19: Transitioned to 12 hour nocturnal TF 9/20: MBS -> diet advanced to DYS 1 with nectar thick liquids 9/24: diet advanced to DYS 3 with nectar thick liquids; nocturnal TF increased to 29mL/hr 9/25: Nocturnal TF discontinued  ID requesting a line holiday today. Per discussion with Surgery, plan to hold TPN for 48-72 hours and then attempt to resume.  While TPN off the next few days, plan to restart nocturnal TF at 49mL/hr.   Patient NPO at midnight for port removal tomorrow. Plan to start nocturnal TF earlier this evening and turn off at midnight.  Patient in bed at time of visit. Discussed plan as above and patient endorsed understanding. SBFT remains in place.   Patient states diet is going fairly. Not able to  eat a lot and had another episode of vomiting after some peaches and medications.  Meal intakes not often documented. She is documented to have had 15% of lunch on 9/24 and 0% of breakfast today. Encouraged patient to aim for small and frequent meals throughout the days as these may be better tolerated.   When inquiring about patient tolerating oral Molli Posey, patient reports she has not been drinking it and has been getting it through the SBFT.  Discussed that this is supposed to be consumed orally as the goal is to have the tube eventually  removed so would want to make sure patient is tolerating orally. Patient endorsed understanding.   As patient has only 100cm bowel left, she will need to be TPN dependent long term. 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      Admit weight: 125# Current weight: 127# I&O's: -10.5L   Medications reviewed and include: 500mg  vitamin C BID, 220mg  zinc x14 days (ends 10/4), 300mg  ferrous sulfate, 400 units vitamin D, 100 units vitamin E, 2mg  vitamin K IV weekly, Lomotil, Imodium   Labs reviewed:  Na 134 HA1C 5.7 Blood Glucose 100-142 x24 hours Triglycerides 53 (as of 9/26)   Diet Order:   Diet Order             Diet NPO time specified Except for: Sips with Meds  Diet effective midnight           DIET DYS 3 Fluid consistency: Nectar Thick  Diet effective now                   EDUCATION NEEDS:  Education needs have been addressed  Skin:  Skin Assessment: Reviewed RN Assessment Skin Integrity Issues:: Incisions Incisions: Abdomen  Last BM:  9/26 - type 7 - ileostomy  Height:  Ht Readings from Last 1 Encounters:  12/13/22 5\' 4"  (1.626 m)   Weight:  Wt Readings from Last 1 Encounters:  01/04/23 57.8 kg    BMI:  Body mass index is 21.87 kg/m.  Estimated Nutritional Needs:  Kcal:  1600-1800 Protein:  85-95 grams Fluid:  1.6L/day    Shelle Iron RD, LDN For contact information, refer to [facility].

## 2023-01-04 NOTE — Progress Notes (Signed)
PHARMACY - TOTAL PARENTERAL NUTRITION CONSULT NOTE   Indication: Short bowel syndrome  Patient Measurements: Height: 5\' 4"  (162.6 cm) Weight: 57.8 kg (127 lb 6.8 oz) IBW/kg (Calculated) : 54.7   Body mass index is 21.87 kg/m. Usual Weight: 46.3 kg from 8/28  Assessment: 47 y.o. female with PMH of recurrent breast cancer not currently on treatment, Crohn's disease s/p subtotal colectomy in 2021, recurrent obstructions, colonic stricture, recent hospitalization 8/10-8/15/2024 for obstruction who presented on 12/12/2022 with abdominal pain, nausea, vomiting diarrhea.    9/4 Admit; found down in ED bathroom with no pulse, CPR > intubated 9/5 Exp-lap, found to have internal hernia with small bowel ischemia, s/p SBR only 105 cm of small bowel remaining 9/7 OR for closure, ostomy creation 9/8 pharmacy consulted to dose TPN, transitioned to cyclic TPN 9/15 due to plan for long-term TPN for malabsorption with short gut.  Glucose / Insulin: no Hx DM - steroids PTA for Crohn's disease; weaned off as of 9/13 - CBGs ok last 24 hrs - SSI: 1 units given in past 24hrs Electrolytes: Electrolytes WNL, CorrCa 10 Renal: SCr <1 Hepatic: LFTs, Tbili WNL.  Alk Phos elevated at 196, Albumin low; TG WNL I/O: - UOP 1725 mL - Ileostomy 1750 mL - Emesis/NG minimal GI Imaging:  - CTA 9/10 shows new PE, but also abdominal ascites - CT Abd: Large volume ascites GI Surgeries / Procedures:  - OR 9/5 ex lap, internal hernia w/ small bowel ischemia, SBR & celiotomy; > 105 cm small bowel remaining - OR 9/7 for closure, ostomy creation - 9/17: Paracentesis - 9/23 paracentesis   Central access: triple lumen CVC R subclavian 9/6, [facility] 05/02/22 TPN start date: 12/17/22  Nutritional Goals: TPN provides 94 g protein and 1680 kcal per day  RD Assessment: Estimated Needs Total Energy Estimated Needs: 1600-1800 Total Protein Estimated Needs: 85-95 grams Total Fluid Estimated Needs: 1.6L/day  Current Nutrition:   Cyclic TPN (18:00-06:00) Diet: DYS 3 with nectar thickened liquids  [employer] 1.4 orally BID, each supplement provides 455 kcal and 20 grams protein   Plan: -Leukocytosis, febrile - plan for line holiday -No TPN next 24-48 hrs, placed on nocturnal tube feeds in interim -Follow for plan to resume TPN when appropriate  Pricilla Riffle, PharmD, BCPS Clinical Pharmacist 01/04/2023 12:19 PM

## 2023-01-04 NOTE — Plan of Care (Signed)

## 2023-01-04 NOTE — Progress Notes (Signed)
Pt currently hospitalized and appt for today with MD canceled.  RN sent in-basket to scheduling team to reschedule.

## 2023-01-05 ENCOUNTER — Other Ambulatory Visit: Payer: Self-pay

## 2023-01-05 ENCOUNTER — Inpatient Hospital Stay (HOSPITAL_COMMUNITY): Payer: Managed Care, Other (non HMO) | Admitting: Anesthesiology

## 2023-01-05 ENCOUNTER — Encounter (HOSPITAL_COMMUNITY)
Admission: EM | Disposition: A | Discharge status: Nursing Home (Includes ICF) | Payer: Self-pay | Source: Home / Self Care | Attending: Internal Medicine

## 2023-01-05 ENCOUNTER — Encounter (HOSPITAL_COMMUNITY): Payer: Self-pay | Admitting: Internal Medicine

## 2023-01-05 DIAGNOSIS — Z452 Encounter for adjustment and management of vascular access device: Secondary | ICD-10-CM | POA: Diagnosis not present

## 2023-01-05 DIAGNOSIS — K90829 Short bowel syndrome, unspecified: Secondary | ICD-10-CM | POA: Diagnosis not present

## 2023-01-05 DIAGNOSIS — K5090 Crohn's disease, unspecified, without complications: Secondary | ICD-10-CM

## 2023-01-05 HISTORY — PX: PORT-A-CATH REMOVAL: SHX5289

## 2023-01-05 LAB — CBC
HCT: 23.9 % — ABNORMAL LOW (ref 36.0–46.0)
Hemoglobin: 7.6 g/dL — ABNORMAL LOW (ref 12.0–15.0)
MCH: 28.1 pg (ref 26.0–34.0)
MCHC: 31.8 g/dL (ref 30.0–36.0)
MCV: 88.5 fL (ref 80.0–100.0)
Platelets: 381 10*3/uL (ref 150–400)
RBC: 2.7 MIL/uL — ABNORMAL LOW (ref 3.87–5.11)
RDW: 19.1 % — ABNORMAL HIGH (ref 11.5–15.5)
WBC: 24.3 10*3/uL — ABNORMAL HIGH (ref 4.0–10.5)
nRBC: 0 % (ref 0.0–0.2)

## 2023-01-05 LAB — GLUCOSE, CAPILLARY
Glucose-Capillary: 94 mg/dL (ref 70–99)
Glucose-Capillary: 96 mg/dL (ref 70–99)

## 2023-01-05 LAB — VITAMIN E
Vitamin E (Alpha Tocopherol): 16.9 mg/L (ref 7.0–25.1)
Vitamin E(Gamma Tocopherol): 1.4 mg/L (ref 0.5–5.5)

## 2023-01-05 LAB — VITAMIN A: Vitamin A (Retinoic Acid): 26.9 ug/dL (ref 20.1–62.0)

## 2023-01-05 LAB — HEPARIN LEVEL (UNFRACTIONATED): Heparin Unfractionated: 0.49 [IU]/mL (ref 0.30–0.70)

## 2023-01-05 SURGERY — REMOVAL PORT-A-CATH
Anesthesia: Monitor Anesthesia Care | Laterality: Left

## 2023-01-05 MED ORDER — ORAL CARE MOUTH RINSE: 15.0000 mL | OROMUCOSAL | Status: DC | PRN

## 2023-01-05 MED ORDER — PROPOFOL 500 MG/50ML IV EMUL
INTRAVENOUS | Status: DC | PRN
  Administered 2023-01-05: 75 ug/kg/min via INTRAVENOUS

## 2023-01-05 MED ORDER — ONDANSETRON HCL 4 MG/2ML IJ SOLN
INTRAMUSCULAR | Status: DC | PRN
Start: 2023-01-05 — End: 2023-01-05
  Administered 2023-01-05: 4 mg via INTRAVENOUS

## 2023-01-05 MED ORDER — PROPOFOL 10 MG/ML IV BOLUS
INTRAVENOUS | Status: AC
  Filled 2023-01-05: qty 20

## 2023-01-05 MED ORDER — FENTANYL CITRATE (PF) 100 MCG/2ML IJ SOLN
INTRAMUSCULAR | Status: DC | PRN
  Administered 2023-01-05 (×2): 50 ug via INTRAVENOUS

## 2023-01-05 MED ORDER — ACETAMINOPHEN 10 MG/ML IV SOLN: 1000.0000 mg | Freq: Once | INTRAVENOUS | Status: DC | PRN

## 2023-01-05 MED ORDER — LIDOCAINE HCL (PF) 2 % IJ SOLN
INTRAMUSCULAR | Status: AC
  Filled 2023-01-05: qty 5

## 2023-01-05 MED ORDER — LIDOCAINE 2% (20 MG/ML) 5 ML SYRINGE
INTRAMUSCULAR | Status: DC | PRN
  Administered 2023-01-05: 100 mg via INTRAVENOUS

## 2023-01-05 MED ORDER — ACETAMINOPHEN 160 MG/5ML PO SOLN: 1000.0000 mg | Freq: Once | ORAL | Status: DC | PRN

## 2023-01-05 MED ORDER — MIDAZOLAM HCL 2 MG/2ML IJ SOLN
INTRAMUSCULAR | Status: AC
  Filled 2023-01-05: qty 2

## 2023-01-05 MED ORDER — ONDANSETRON HCL 4 MG/2ML IJ SOLN
INTRAMUSCULAR | Status: AC
  Filled 2023-01-05: qty 2

## 2023-01-05 MED ORDER — ACETAMINOPHEN 500 MG PO TABS: 1000.0000 mg | ORAL_TABLET | Freq: Once | ORAL | Status: DC | PRN

## 2023-01-05 MED ORDER — LACTATED RINGERS IV SOLN: INTRAVENOUS | Status: DC

## 2023-01-05 MED ORDER — LIDOCAINE HCL (PF) 1 % IJ SOLN
INTRAMUSCULAR | Status: AC
  Filled 2023-01-05: qty 30

## 2023-01-05 MED ORDER — OXYCODONE HCL 5 MG/5ML PO SOLN: 5.0000 mg | Freq: Once | ORAL | Status: DC | PRN

## 2023-01-05 MED ORDER — BUPIVACAINE HCL (PF) 0.5 % IJ SOLN
INTRAMUSCULAR | Status: AC
  Filled 2023-01-05: qty 30

## 2023-01-05 MED ORDER — 0.9 % SODIUM CHLORIDE (POUR BTL) OPTIME
TOPICAL | Status: DC | PRN
Start: 2023-01-05 — End: 2023-01-05
  Administered 2023-01-05: 1000 mL

## 2023-01-05 MED ORDER — PHENYLEPHRINE 80 MCG/ML (10ML) SYRINGE FOR IV PUSH (FOR BLOOD PRESSURE SUPPORT)
PREFILLED_SYRINGE | INTRAVENOUS | Status: DC | PRN
Start: 2023-01-05 — End: 2023-01-05
  Administered 2023-01-05 (×4): 160 ug via INTRAVENOUS
  Administered 2023-01-05: 80 ug via INTRAVENOUS

## 2023-01-05 MED ORDER — PROPOFOL 1000 MG/100ML IV EMUL
INTRAVENOUS | Status: AC
  Filled 2023-01-05: qty 100

## 2023-01-05 MED ORDER — PROPOFOL 10 MG/ML IV BOLUS
INTRAVENOUS | Status: DC | PRN
  Administered 2023-01-05 (×2): 20 mg via INTRAVENOUS

## 2023-01-05 MED ORDER — OXYCODONE HCL 5 MG PO TABS: 5.0000 mg | ORAL_TABLET | Freq: Once | ORAL | Status: DC | PRN

## 2023-01-05 MED ORDER — FENTANYL CITRATE PF 50 MCG/ML IJ SOSY
25.0000 ug | PREFILLED_SYRINGE | INTRAMUSCULAR | Status: DC | PRN
  Administered 2023-01-05: 25 ug via INTRAVENOUS

## 2023-01-05 MED ORDER — FENTANYL CITRATE PF 50 MCG/ML IJ SOSY
PREFILLED_SYRINGE | INTRAMUSCULAR | Status: AC
  Filled 2023-01-05: qty 2

## 2023-01-05 MED ORDER — DEXAMETHASONE SODIUM PHOSPHATE 10 MG/ML IJ SOLN
INTRAMUSCULAR | Status: AC
  Filled 2023-01-05: qty 1

## 2023-01-05 MED ORDER — MIDAZOLAM HCL 5 MG/5ML IJ SOLN
INTRAMUSCULAR | Status: DC | PRN
  Administered 2023-01-05 (×2): 1 mg via INTRAVENOUS

## 2023-01-05 MED ORDER — FENTANYL CITRATE (PF) 100 MCG/2ML IJ SOLN
INTRAMUSCULAR | Status: AC
  Filled 2023-01-05: qty 2

## 2023-01-05 MED ORDER — LIDOCAINE 1% INJECTION FOR CIRCUMCISION
INJECTION | INTRAVENOUS | Status: DC | PRN
Start: 2023-01-05 — End: 2023-01-05
  Administered 2023-01-05: 3 mL via SUBCUTANEOUS

## 2023-01-05 MED ORDER — HEPARIN (PORCINE) 25000 UT/250ML-% IV SOLN
2000.0000 [IU]/h | INTRAVENOUS | Status: DC
  Administered 2023-01-05 – 2023-01-06 (×2): 2000 [IU]/h via INTRAVENOUS
  Filled 2023-01-05 (×2): qty 250

## 2023-01-05 SURGICAL SUPPLY — 32 items
ADH SKN CLS APL DERMABOND .7 (GAUZE/BANDAGES/DRESSINGS) ×1
APL PRP STRL LF DISP 70% ISPRP (MISCELLANEOUS) ×1
APL SKNCLS STERI-STRIP NONHPOA (GAUZE/BANDAGES/DRESSINGS) ×1
BAG COUNTER SPONGE SURGICOUNT (BAG) IMPLANT
BAG SPNG CNTER NS LX DISP (BAG)
BENZOIN TINCTURE PRP APPL 2/3 (GAUZE/BANDAGES/DRESSINGS) ×1 IMPLANT
BLADE SURG 15 STRL LF DISP TIS (BLADE) ×1 IMPLANT
BLADE SURG 15 STRL SS (BLADE) ×1
BNDG ADH 1X3 SHEER STRL LF (GAUZE/BANDAGES/DRESSINGS) IMPLANT
BNDG ADH THN 3X1 STRL LF (GAUZE/BANDAGES/DRESSINGS)
CHLORAPREP W/TINT 26 (MISCELLANEOUS) ×1 IMPLANT
DERMABOND ADVANCED .7 DNX12 (GAUZE/BANDAGES/DRESSINGS) ×1 IMPLANT
DRAPE LAPAROTOMY TRNSV 102X78 (DRAPES) ×1 IMPLANT
ELECT REM PT RETURN 15FT ADLT (MISCELLANEOUS) ×1 IMPLANT
GLOVE BIO SURGEON STRL SZ7.5 (GLOVE) ×1 IMPLANT
GOWN STRL REUS W/ TWL XL LVL3 (GOWN DISPOSABLE) ×2 IMPLANT
GOWN STRL REUS W/TWL XL LVL3 (GOWN DISPOSABLE) ×2
KIT BASIN OR (CUSTOM PROCEDURE TRAY) ×1 IMPLANT
KIT TURNOVER KIT A (KITS) IMPLANT
NDL HYPO 22X1.5 SAFETY MO (MISCELLANEOUS) ×1 IMPLANT
NEEDLE HYPO 22X1.5 SAFETY MO (MISCELLANEOUS) ×1 IMPLANT
PACK BASIC VI WITH GOWN DISP (CUSTOM PROCEDURE TRAY) ×1 IMPLANT
PENCIL SMOKE EVACUATOR (MISCELLANEOUS) IMPLANT
SPIKE FLUID TRANSFER (MISCELLANEOUS) ×1 IMPLANT
SPONGE T-LAP 4X18 ~~LOC~~+RFID (SPONGE) ×1 IMPLANT
STRIP CLOSURE SKIN 1/2X4 (GAUZE/BANDAGES/DRESSINGS) IMPLANT
SUT MNCRL AB 4-0 PS2 18 (SUTURE) ×1 IMPLANT
SUT VIC AB 3-0 SH 27 (SUTURE) ×1
SUT VIC AB 3-0 SH 27XBRD (SUTURE) ×1 IMPLANT
SYR 20ML LL LF (SYRINGE) ×1 IMPLANT
TOWEL OR 17X26 10 PK STRL BLUE (TOWEL DISPOSABLE) ×1 IMPLANT
TOWEL OR NON WOVEN STRL DISP B (DISPOSABLE) ×1 IMPLANT

## 2023-01-05 NOTE — Progress Notes (Signed)
PROGRESS NOTE    [name]  ZHY:[id number] DOB: 1975/07/19 DOA: 12/12/2022 PCP: Ollen Bowl, MD   Brief Narrative:  [name] is a 47 y.o. female with PMH significant for Crohn's disease with previous bowel obstruction s/p subtotal colectomy 2021, colonic stricture, IDA, breast cancer s/p mastectomy 2022 currently not on treatment. Recently hospitalized 8/10-8/15/2024 for bowel obstruction seen by GI and general surgery. Underwent EGD and flexible sigmoidoscopy and discharged on steroids.   9/3, patient presented to the ED with abdominal distention, pain, nausea, vomiting CT abd/pelvis showed markedly dilated distal small bowel consistent with chronic obstruction. No changes since prior study.  Patient was admitted to [facility] with a plan of GI consultation.   9/4, in the morning, within few hours of admission, patient was found pulseless on the floor in the bathroom in ED, estimated downtime about 5 minutes. ACLS was initiated. Concern for possible torsades de pointes and given IV mag, defibrillated.  ROSC achieved in about 5 minutes. She was intubated. She had copious vomitus in the ETT.  Notably, she had I-stat peri-code which showed hemoglobin of 5.5 down from 10 overnight.  Admitted to ICU In the ICU, patient had right femoral CVC placed.  Given 3 minutes of PRBC, 3 units of FFP.  Patient was started on vasopressors   9/5, underwent exploratory laparotomy.  Noted to have internal hernia with associated small bowel ischemia.  Underwent bowel resection. 9/7, back to OR for ostomy placement 9/8, weaned off Levophed 9/10, extubated 9/10, found to have PE in CT angio chest.  Started on heparin drip which was subsequently stopped the next day because of drop in hemoglobin 9/12, mental status improved, became more interactive.  However failed swallow eval. 9/16, transferred out to [facility] 9/16, CT abdomen showed peritoneal fluid collection 9/17, underwent paracentesis, 500 cc of  clear yellow fluid drained 9/22, CT abdomen raised concern of early developing obstruction, peritonitis.  9/23, paracentesis by IR.  Drained 90 cc of turbid, amber/blood-tinged fluid   Patient's hospitalization has been prolonged because of persistent fever, persistent high output from ostomy, anemia requiring blood transfusion, dysphagia.  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 Hillery Hunter s/p ex lap, ileostomy, abdominal closure 9/7 Dr. Hillery Hunter Currently only has 100 cm of small bowel left and hence started syndrome.  Continues to have significant ostomy output.   TPN dependent To slow down ostomy output, patient is currently on Imodium, iron, FiberCon, Imodium.  Surgery following and managing.   Dysphagia Postextubation, patient failed speech therapy  reevaluation multiple amps.  Gradually improving and currently on dysphagia 3 diet, tube feeding during the day and TPN during the night.    Persistent sepsis/fever of unknown origin Patient continued to  have persistent fever, tachycardia and leukocytosis Extensive workup done.  9/19, ID consulted.  Currently on ciprofloxacin, Flagyl and micafungin. 9/22, CT abdomen raised concern of early developing obstruction, peritonitis. 9/23, paracentesis by IR.  Drained 90 cc of turbid, amber/blood-tinged fluid, culture negative.  Seen by ID 01/03/2023, they recommend line holiday.  Central line was removed on 01/04/2023.  Patient's last temperature spike was prior to that and was 100.4 at 7 AM same day.  Patient's port was removed by general surgery on 01/05/2023 as well.  Her tachycardia has also improved.  She will need central line again for TPN, per general surgery. Repeat blood culture obtained 01/03/2023.  Persistent tachycardia Primarily due to sepsis but also contributed by effective hypovolemia and pulm embolism. Currently on Metropol 50 mg twice daily.  Tachycardia is improving.   AKI Likely due to ATN related to septic shock.  Resolved.  Pulmonary embolism Left lower lobe segmental PE diagnosed on 9/10, initially started on heparin which was stopped after 1 day due to anemia but then resumed on 12/30/2022.   Acute on chronic anemia Hemoglobin trend as below.  Received a total of 9 units of PRBC so far. 9/21, heparin drip was resumed.  Hemoglobin fairly stable since 12/31/2022.  No active bleeding.  Continue to monitor.  Hemoglobin has remained stable around 7.5 for the last few days.  Hyperglycemia A1c 5.7 on 9//24 Continue SSI/Accu-Cheks  Loss of voice/hoarse voice Postextubation, patient was noted to have dysphagia as well as loss of voice. If no improvement, we'll involve ENT after acute issues resolve however per patient, her voice is improving but very slowly.   Recurrent breast cancer: Follows up with oncology Dr. Pamelia Hoit   DVT prophylaxis: Place and maintain sequential compression device Start: 12/15/22 1031 SCDs Start: 12/13/22 0413   Code Status: Full Code  Family Communication:   None present at bedside.  Plan of care discussed with patient in length and he/she verbalized understanding and agreed with it.  Status is: Inpatient Remains inpatient appropriate because: Patient is still very sick with multiple problems.     Estimated body mass index is 21.87 kg/m as calculated from the following:   Height as of this encounter: 5\' 4"  (1.626 m).   Weight as of this encounter: 57.8 kg.    Nutritional Assessment: Body mass index is 21.87 kg/m.Marland Kitchen Seen by dietician.  I agree with the assessment and plan as outlined below: Nutrition Status: Nutrition Problem: Moderate Malnutrition Etiology: chronic illness (Crohn's disease; metastatic breast cancer) Signs/Symptoms: moderate fat depletion, moderate muscle depletion, percent weight loss (24% in 4.5 months) Percent weight loss: 24 % (in 4.5 months) Interventions: Tube feeding, TPN  . Skin Assessment: I have examined the patient's skin and I agree with the wound assessment as performed by the wound care RN as outlined below:    Consultants:  ID and general surgery  Procedures:  As above  Antimicrobials:  Anti-infectives (From admission, onward)    Start     Dose/Rate Route Frequency Ordered Stop   12/29/22 1215  ciprofloxacin (CIPRO) IVPB 400 mg        400 mg 200 mL/hr over 60 Minutes Intravenous 2 times daily 12/29/22 1123     12/29/22 1215  metroNIDAZOLE (FLAGYL) IVPB 500 mg  500 mg 100 mL/hr over 60 Minutes Intravenous 2 times daily 12/29/22 1123     12/28/22 1430  micafungin (MYCAMINE) 100 mg in sodium chloride 0.9 % 100 mL IVPB  Status:  Discontinued        100 mg 105 mL/hr over 1 Hours Intravenous Every 24 hours 12/28/22 1335 01/05/23 0731   12/23/22 2000  vancomycin (VANCOREADY) IVPB 1250 mg/250 mL  Status:  Discontinued        1,250 mg 166.7 mL/hr over 90 Minutes Intravenous Every 36 hours 12/22/22 0612 12/22/22 1103   12/22/22 0645  vancomycin (VANCOREADY) IVPB 1250 mg/250 mL        1,250  mg 166.7 mL/hr over 90 Minutes Intravenous  Once 12/22/22 0551 12/22/22 0805   12/22/22 0600  piperacillin-tazobactam (ZOSYN) IVPB 3.375 g  Status:  Discontinued        3.375 g 12.5 mL/hr over 240 Minutes Intravenous Every 8 hours 12/22/22 0551 12/29/22 1123   12/13/22 1000  piperacillin-tazobactam (ZOSYN) IVPB 3.375 g  Status:  Discontinued        3.375 g 12.5 mL/hr over 240 Minutes Intravenous Every 8 hours 12/13/22 0833 12/20/22 1333         Subjective: Patient seen and examined after port removal.  She had no complaint.  RN at the bedside.  Objective: Vitals:   01/05/23 1015 01/05/23 1200 01/05/23 1215 01/05/23 1230  BP: 105/79     Pulse: (!) 114 100 100 100  Resp: 18 16 18  (!) 23  Temp: 98 F (36.7 C) 98.9 F (37.2 C)    TempSrc:  Oral    SpO2: 94% 100% 100% 96%  Weight:      Height:        Intake/Output Summary (Last 24 hours) at 01/05/2023 1306 Last data filed at 01/05/2023 0919 Gross per 24 hour  Intake 1745.16 ml  Output 1100 ml  Net 645.16 ml   Filed Weights   01/03/23 0500 01/04/23 0500 01/05/23 0811  Weight: 59.1 kg 57.8 kg 57.8 kg    Examination:  General exam: Appears calm and comfortable but deconditioned Respiratory system: Clear to auscultation. Respiratory effort normal. Cardiovascular system: S1 & S2 heard, RRR. No JVD, murmurs, rubs, gallops or clicks. No pedal edema. Gastrointestinal system: Abdomen is nondistended, slightly firm and generalized tender but much improved compared to yesterday. No organomegaly or masses felt. Normal bowel sounds heard. Central nervous system: Alert and oriented. No focal neurological deficits. Extremities: Symmetric 5 x 5 power. Skin: No rashes, lesions or ulcers.  Psychiatry: Judgement and insight appear normal. Mood & affect appropriate.    Data Reviewed: I have personally reviewed following labs and imaging studies  CBC: Recent Labs  Lab 01/01/23 1110 01/02/23 0517 01/03/23 0632 01/04/23 0500  01/05/23 0506  WBC 18.1* 18.6* 20.1* 23.0* 24.3*  HGB 7.8* 7.7* 7.6* 7.7* 7.6*  HCT 24.2* 24.7* 24.3* 24.5* 23.9*  MCV 88.3 90.8 90.7 89.4 88.5  PLT 314 340 348 388 381   Basic Metabolic Panel: Recent Labs  Lab 12/30/22 0514 12/31/22 0504 01/01/23 0456 01/01/23 0617 01/02/23 0517 01/04/23 0500 01/04/23 0600  NA 136 134* 135 133* 138  --  134*  K 4.1 4.3 3.8 3.8 4.2  --  4.2  CL 102 101 102 101 103  --  101  CO2 25 25 25 27 26   --  25  GLUCOSE 129* 121* 149* 121* 121*  --  129*  BUN 40* 35* 33* 34* 33*  --  27*  CREATININE 1.06* 0.72 1.01* 0.94 0.86  --  0.64  CALCIUM 7.8* 8.1* 7.2* 7.2* 8.0*  --  8.0*  MG 1.9 2.0 1.9  --   --  1.8  --   PHOS 2.9 2.9 3.1  --   --  3.5  --    GFR: Estimated Creatinine Clearance: 76.7 mL/min (by C-G formula based on SCr of 0.64 mg/dL). Liver Function Tests: Recent Labs  Lab 12/30/22 0514 01/01/23 0617 01/04/23 0600  AST 24 32 44*  ALT 23 26 30   ALKPHOS 149* 148* 196*  BILITOT 0.6 0.4 0.7  PROT 5.1* 5.6* 6.2*  ALBUMIN <1.5* <1.5* 1.5*   No results for input(s): "LIPASE", "AMYLASE" in the last 168 hours. No results for input(s): "AMMONIA" in the last 168 hours. Coagulation Profile: Recent Labs  Lab 12/30/22 1338  INR 1.3*   Cardiac Enzymes: No results for input(s): "CKTOTAL", "CKMB", "CKMBINDEX", "TROPONINI" in the last 168 hours. BNP (last 3 results) No results for input(s): "PROBNP" in the last 8760 hours. HbA1C: No results for input(s): "HGBA1C" in the last 72 hours. CBG: Recent Labs  Lab 01/04/23 0726 01/04/23 1215 01/04/23 2006 01/04/23 2352 01/05/23 1145  GLUCAP 107* 97 88 102* 94   Lipid Profile: Recent Labs    01/04/23 0500  TRIG 53   Thyroid Function Tests: No results for input(s): "TSH", "T4TOTAL", "FREET4", "T3FREE", "THYROIDAB" in the last 72 hours. Anemia Panel: No results for input(s): "VITAMINB12", "FOLATE", "FERRITIN", "TIBC", "IRON", "RETICCTPCT" in the last 72 hours. Sepsis Labs: Recent Labs   Lab 01/03/23 1257 01/04/23 0924  PROCALCITON  --  0.43  LATICACIDVEN 1.2  --     Recent Results (from the past 240 hour(s))  Aerobic/Anaerobic Culture w Gram Stain (surgical/deep wound)     Status: None   Collection Time: 12/26/22  3:07 PM   Specimen: PATH Cytology Peritoneal fluid  Result Value Ref Range Status   Specimen Description   Final    PERITONEAL Performed at [facility], [address], [place], [place] [address]    Special Requests   Final    NONE Performed at [facility], [address], [place], [place] [address]    Gram Stain   Final    FEW WBC PRESENT,BOTH PMN AND MONONUCLEAR NO ORGANISMS SEEN    Culture   Final    No growth aerobically or anaerobically. Performed at [facility], [address] [place], [place] [address]    Report Status 12/31/2022 FINAL  Final  Fungus culture, blood     Status: None   Collection Time: 12/27/22  1:07 PM   Specimen: BLOOD  Result Value Ref Range Status   Specimen Description   Final    BLOOD SITE NOT SPECIFIED Performed at [facility], [address], [place], [place] [address]    Special Requests   Final    BOTTLES DRAWN AEROBIC ONLY Blood Culture adequate volume Performed at [facility], [address] [place], [place] [address]    Culture   Final    NO GROWTH 7 DAYS NO FUNGUS ISOLATED Performed at [facility], [address], [place], [place] [address]    Report Status 01/03/2023 FINAL  Final  Culture, blood (Routine X 2) w Reflex to ID Panel     Status: None   Collection Time: 12/27/22  1:07 PM   Specimen: BLOOD RIGHT ARM  Result Value Ref Range Status   Specimen Description  Final    BLOOD RIGHT ARM Performed at [facility], [address] [place], [place] [address]    Special Requests   Final    BOTTLES DRAWN AEROBIC AND ANAEROBIC Blood Culture adequate volume Performed at [facility], [address] [place], [place] [address]    Culture   Final    NO GROWTH 5 DAYS Performed at [facility], [address], [place], [place] [address]    Report Status 01/01/2023 FINAL  Final  Culture, blood (Routine X 2) w Reflex to ID Panel     Status: None   Collection Time: 12/27/22  1:07 PM   Specimen: BLOOD LEFT ARM  Result Value Ref Range Status   Specimen Description   Final    BLOOD LEFT ARM Performed at [facility], [address], [place], [place] [address]    Special Requests   Final    BOTTLES DRAWN AEROBIC ONLY Blood Culture adequate volume Performed at [facility], [address], [place], [place] [address]    Culture   Final    NO GROWTH 5 DAYS Performed at [facility], [address], [place], [place] [address]    Report Status 01/01/2023 FINAL  Final  Culture, fungus without smear     Status: None (Preliminary result)   Collection Time: 01/01/23 11:52 AM   Specimen: Peritoneal Washings; Other  Result Value Ref Range Status   Specimen Description   Final    PERITONEAL Performed at [facility], [address], [place], [place] [address]    Special Requests   Final    NONE Performed at [facility], [address], [place], [place] [address]    Culture   Final    NO FUNGUS ISOLATED AFTER 3 DAYS Performed at [facility], [address], [place], [place] [address]    Report Status PENDING  Incomplete  Acid Fast Smear (AFB)     Status: None   Collection Time: 01/01/23 11:52 AM   Specimen: Peritoneal  Result Value Ref Range Status   AFB Specimen Processing Concentration  Final   Acid Fast Smear Negative  Final    Comment: (NOTE) Performed At: [facility] [address] [place], [place] [id number] Jolene Schimke MD [id number]    Source (AFB) NONE  Final    Comment: Performed at [facility], [address], [place], [place]  [address]  Aerobic/Anaerobic Culture w Gram Stain (surgical/deep wound)     Status: None (Preliminary result)   Collection Time: 01/01/23 11:52 AM   Specimen: Peritoneal Washings  Result Value Ref Range Status   Specimen Description   Final    PERITONEAL Performed at [facility], [address] [place], [place] [address]    Special Requests   Final    NONE Performed at [facility], [address], [place], [place] [address]    Gram Stain   Final    ABUNDANT WBC PRESENT, PREDOMINANTLY MONONUCLEAR NO ORGANISMS SEEN    Culture   Final    NO GROWTH 4 DAYS NO ANAEROBES ISOLATED; CULTURE IN PROGRESS FOR 5 DAYS Performed at [facility], [address] [place], [place] [address]    Report Status PENDING  Incomplete  Culture, blood (Routine X 2) w Reflex to ID Panel     Status: None (Preliminary result)   Collection Time: 01/03/23  5:32 PM   Specimen: BLOOD  Result Value Ref Range Status   Specimen Description   Final    BLOOD PICC LINE Performed at [facility], [address] [place], [place] [address]    Special Requests   Final    BOTTLES DRAWN AEROBIC AND ANAEROBIC Blood Culture adequate volume Performed at [facility], [address] [place], [place] [address]    Culture   Final    NO GROWTH 2 DAYS Performed at [facility], [address], [place], [place] [address]    Report Status PENDING  Incomplete  Culture, blood (Routine X 2) w Reflex to ID Panel     Status: None (Preliminary result)   Collection Time: 01/03/23  5:32 PM   Specimen: BLOOD LEFT FOREARM  Result Value Ref Range Status   Specimen Description   Final    BLOOD LEFT FOREARM Performed at [facility], [address] [place], [place] [address]    Special Requests   Final    BOTTLES DRAWN AEROBIC AND ANAEROBIC Blood Culture adequate volume Performed at [facility], [address]  [place], [place] [address]    Culture   Final    NO GROWTH 2 DAYS Performed at [facility], [address], [place], [place] [address]    Report Status PENDING  Incomplete     Radiology Studies: No results found.  Scheduled Meds:  ascorbic acid  500 mg Per Tube BID   cholecalciferol  400 Units Per Tube Daily   diphenoxylate-atropine  1 tablet Per Tube QID   famotidine  20 mg Per Tube Daily   feeding supplement (KATE FARMS STANDARD 1.4)  325 mL Oral BID BM   feeding supplement (KATE FARMS STANDARD 1.4)  360 mL Per Tube Q24H   ferrous sulfate  300 mg Per Tube BID   fiber  1 packet Per Tube TID   gabapentin  200 mg Per Tube Q8H   insulin aspart  0-9 Units Subcutaneous 4 times per day   loperamide HCl  4 mg Per Tube QID   metoprolol tartrate  50 mg Per Tube BID   mouth rinse  15 mL Mouth Rinse 4 times per day   sodium chloride flush  10-40 mL Intracatheter Q12H   Vitamin D (Ergocalciferol)  50,000 Units Oral Q7 days   vitamin E  100 Units Per Tube Daily   zinc sulfate  220 mg Per Tube Daily   Continuous Infusions:  ciprofloxacin Stopped (01/05/23 1258)   heparin 2,000 Units/hr (01/05/23 1303)   metronidazole 500 mg (01/05/23 1258)   phytonadione (VITAMIN K) 2 mg in dextrose 5 % 50 mL IVPB Stopped (01/04/23 1559)     LOS: 23 days   Hughie Closs, MD [facility]  01/05/2023, 1:06 PM   *Please note that this is a verbal dictation therefore any spelling or grammatical errors are due to the "Dragon Medical One" system interpretation.  Please page via Amion and do not message via secure chat for urgent patient care matters. Secure chat can be used for non urgent patient care matters.  How to contact the [facility] Attending or Consulting provider 7A - 7P or covering provider during after hours 7P -7A, for this patient?  Check the care team in [facility] and look for a) attending/consulting [facility] provider listed and b) the [facility] team listed. Page or secure chat 7A-7P. Log into  [email or web address] and use [facility] universal  password to access. If you do not have the password, please contact the hospital operator. Locate the [facility] provider you are looking for under [facility] and page to a number that you can be directly reached. If you still have difficulty reaching the provider, please page the [facility] (Director on Call) for the Hospitalists listed on amion for assistance.

## 2023-01-05 NOTE — Progress Notes (Signed)
ANTICOAGULATION CONSULT NOTE  Pharmacy Consult for heparin Indication: pulmonary embolus  Allergies  Allergen Reactions   Nsaids Other (See Comments)    Crohns disease/ IBD   Chlorhexidine Gluconate Itching    Able to use CHG swabs for ports, reaction with direct skin contact   Hydromorphone Other (See Comments)    Confusion/sedation   Lactose Intolerance (Gi) Other (See Comments)    Upset stomach/diarrhea    Patient Measurements: Height: 5\' 4"  (162.6 cm) Weight: 57.8 kg (127 lb 6.8 oz) IBW/kg (Calculated) : 54.7 Heparin Dosing Weight: TBW  Vital Signs: Temp: 98.9 F (37.2 C) (09/27 1200) Temp Source: Oral (09/27 1200) BP: 105/79 (09/27 1015) Pulse Rate: 100 (09/27 1230)  Labs: Recent Labs    01/03/23 0632 01/03/23 1626 01/04/23 0500 01/04/23 0600 01/04/23 1808 01/05/23 0203 01/05/23 0506  HGB 7.6*  --  7.7*  --   --   --  7.6*  HCT 24.3*  --  24.5*  --   --   --  23.9*  PLT 348  --  388  --   --   --  381  HEPARINUNFRC 0.23*   < > 0.12*  --  0.59 0.49  --   CREATININE  --   --   --  0.64  --   --   --    < > = values in this interval not displayed.    Estimated Creatinine Clearance: 76.7 mL/min (by C-G formula based on SCr of 0.64 mg/dL).   Medications:  No AC PTA.  Assessment: 47 yo F with a PMH of recurrent breast cancer not currently on treatment, who presented to the ED on 12/12/2022 with abdominal pain, nausea, vomiting diarrhea.   Patient was placed on heparin drip from 9/10-9/12 for new LLL PE. On 9/12 heparin drip was discontinued due to hemoglobin dropping and intermittent bleeding in ostomy.   On 9/21: Pharmacy has been consulted to resume heparin drip after transfusion (Hgb 7, transfused 1 unit PRBC)  Today 01/04/2023 AM Heparin level 0.49, therapeutic with heparin infusion at 2000 units/hr Hg 7.6 low but stable, PLT WNL Per discussion with RN, no line issues or concerns On 9/26: Concern for possible heparin resistance given current rate of  heparin at 32 units/kg/hr (and increasing), discussed with provider and plan to switch to Lovenox but pt was concerned about side effects and wants to research first  Heparin off 0535 am for OR S/p PAC removal, per CCS resume heparin now with no bolus  Goal of Therapy:  Heparin level 0.3-0.7 units/ml Monitor platelets by anticoagulation protocol: Yes   Plan:  No bolus per CCS verbal order Resume heparin at 2000 units/hr Daily CBC & Heparin level Monitor for signs/symptoms of bleeding Follow up long-term anticoagulation plans   Herby Abraham, Pharm.D Use secure chat for questions 01/05/2023 1:43 PM

## 2023-01-05 NOTE — Consult Note (Signed)
WOC Nurse wound follow up Wound type: full thickness surgical  Measurement: see 9/23 note  Wound bed:90% red moist 10% tan fibrinous  Drainage (amount, consistency, odor) minimal serosanguinous; some bleeding at 1 o'clock when removed old dressing but quickly subsided with pressure; suture visible mid wound   Periwound: intact, ileostomy left of wound  Dressing procedure/placement/frequency: Removed old NPWT dressing Cleansed wound with normal saline Filled wound with  1 piece white foam, 1 piece black foam   Sealed NPWT dressing at HG Patient received PO pain medication per bedside nurse prior to dressing change Patient tolerated procedure well  WOC nurse will continue to provide NPWT dressing changes due to the complexity of the dressing change; next change Monday 01/08/2023 with CCS PA.  2 Medium VAC kits ordered for room, white foam in room.   WOC Nurse ostomy follow up Stoma type/location: LLQ ileostomy  Stomal assessment/size: 1 1/4" well budded, pink moist round  Peristomal assessment: intact, midline incision to right of ostomy  Treatment options for stomal/peristomal skin: skin prep to surrounding skin and 2" barrier ring  Output liquid green effluent in pouch  Ostomy pouching: 1 piece 2 1/4" skin barrier [place] [phone]), 2 1/4" high output pouch [place] [phone]) and 2" barrier ring [place] [phone])   Education provided: patient just got back from OR having port a cath removed and does not participate in pouch change.  Patient can verbalize steps to pouch change.  I placed a new 2 1/4" skin barrier and high output pouch attached to bedside drainage bag.    Enrolled patient in [place] Secure Start Discharge program: Yes previously   Patient with (2) 2 1/4" skin barriers and 2" barrier rings; (3) high output pouches in room.   WOC will continue to follow for ostomy education and support.   Thank you,    Priscella Mann MSN, RN-BC, [employer] [phone]

## 2023-01-05 NOTE — Anesthesia Preprocedure Evaluation (Signed)
Anesthesia Evaluation  Patient identified by MRN, date of birth, ID band Patient awake    Reviewed: Allergy & Precautions, NPO status , Patient's Chart, lab work & pertinent test results  History of Anesthesia Complications Negative for: history of anesthetic complications  Airway Mallampati: III  TM Distance: >3 FB Neck ROM: Full  Mouth opening: Limited Mouth Opening  Dental  (+) Dental Advisory Given, Teeth Intact   Pulmonary former smoker   + rhonchi        Cardiovascular  Rhythm:Regular Rate:Tachycardia     Neuro/Psych negative neurological ROS  negative psych ROS   GI/Hepatic negative GI ROS, Neg liver ROS,,,  Endo/Other  Lab Results      Component                Value               Date                      HGBA1C                   5.7 (H)             12/13/2022             Renal/GU Lab Results      Component                Value               Date                      NA                       134 (L)             01/04/2023                K                        4.2                 01/04/2023                CO2                      25                  01/04/2023                GLUCOSE                  129 (H)             01/04/2023                BUN                      27 (H)              01/04/2023                CREATININE               0.64                01/04/2023                CALCIUM  8.0 (L)             01/04/2023                GFR                      74.02               12/27/2021                GFRNONAA                 >60                 01/04/2023                Musculoskeletal   Abdominal   Peds  Hematology  (+) Blood dyscrasia, anemia Lab Results      Component                Value               Date                      WBC                      24.3 (H)            01/05/2023                HGB                      7.6 (L)             01/05/2023                HCT                       23.9 (L)            01/05/2023                MCV                      88.5                01/05/2023                PLT                      381                 01/05/2023              Anesthesia Other Findings [name] is a 47 y.o. female with PMH significant for Crohn's disease with previous bowel obstruction s/p subtotal colectomy 2021, colonic stricture, IDA, breast cancer s/p mastectomy 2022 currently not on treatment. Recently hospitalized 8/10-8/15/2024 for bowel obstruction seen by GI and general surgery. Underwent EGD and flexible sigmoidoscopy and discharged on steroids.   9/3, patient presented to the ED with abdominal distention, pain, nausea, vomiting CT abd/pelvis showed markedly dilated distal small bowel consistent with chronic obstruction. No changes since prior study.  Patient was admitted to [facility] with a plan of GI consultation.   9/4, in the morning, within few hours of admission, patient was found pulseless on the floor in the bathroom in ED, estimated downtime about 5 minutes. ACLS was  initiated. Concern for possible torsades de pointes and given IV mag, defibrillated.  ROSC achieved in about 5 minutes. She was intubated. She had copious vomitus in the ETT.  Notably, she had I-stat peri-code which showed hemoglobin of 5.5 down from 10 overnight.  Admitted to ICU In the ICU, patient had right femoral CVC placed.  Given 3 minutes of PRBC, 3 units of FFP.  Patient was started on vasopressors   9/5, underwent exploratory laparotomy.  Noted to have internal hernia with associated small bowel ischemia.  Underwent bowel resection. 9/7, back to OR for ostomy placement 9/8, weaned off Levophed 9/10, extubated 9/10, found to have PE in CT angio chest.  Started on heparin drip which was subsequently stopped the next day because of drop in hemoglobin 9/12, mental status improved, became more interactive.  However failed swallow eval. 9/16, transferred out to  [facility] 9/16, CT abdomen showed peritoneal fluid collection 9/17, underwent paracentesis, 500 cc of clear yellow fluid drained 9/22, CT abdomen raised concern of early developing obstruction, peritonitis.  9/23, paracentesis by IR.  Drained 90 cc of turbid, amber/blood-tinged fluid   Reproductive/Obstetrics                              Anesthesia Physical Anesthesia Plan  ASA: 4  Anesthesia Plan: MAC   Post-op Pain Management: Minimal or no pain anticipated   Induction:   PONV Risk Score and Plan: 2 and Propofol infusion and Ondansetron  Airway Management Planned: Nasal Cannula, Natural Airway and Simple Face Mask  Additional Equipment: None  Intra-op Plan:   Post-operative Plan:   Informed Consent: I have reviewed the patients History and Physical, chart, labs and discussed the procedure including the risks, benefits and alternatives for the proposed anesthesia with the patient or authorized representative who has indicated his/her understanding and acceptance.     Dental advisory given  Plan Discussed with: CRNA  Anesthesia Plan Comments:          Anesthesia Quick Evaluation

## 2023-01-05 NOTE — Progress Notes (Signed)
ID Brief note:   -Pt had prot removed today 9/27 with general surgery. Per OR note no fluid or purulence at port site. -Afebrile x 24 hours -repeat blood Cx following port removal -continue cipro and metro

## 2023-01-05 NOTE — Progress Notes (Signed)
Patient ID: [name], female   DOB: Mar 27, 1976, 47 y.o.   MRN: [id number]   Pre Procedure note for inpatients:   [name] has been scheduled for [facility] REMOVAL today. The various methods of treatment have been discussed with the patient. After consideration of the risks, benefits and treatment options the patient has consented to the planned procedure.   The patient has been seen and labs reviewed. There are no changes in the patient's condition to prevent proceeding with the planned procedure today.  Recent labs:  Lab Results  Component Value Date   WBC 24.3 (H) 01/05/2023   HGB 7.6 (L) 01/05/2023   HCT 23.9 (L) 01/05/2023   PLT 381 01/05/2023   GLUCOSE 129 (H) 01/04/2023   TRIG 53 01/04/2023   ALT 30 01/04/2023   AST 44 (H) 01/04/2023   NA 134 (L) 01/04/2023   K 4.2 01/04/2023   CL 101 01/04/2023   CREATININE 0.64 01/04/2023   BUN 27 (H) 01/04/2023   CO2 25 01/04/2023   TSH 0.413 12/15/2022   INR 1.3 (H) 12/30/2022   HGBA1C 5.7 (H) 12/13/2022    Abigail Miyamoto, MD 01/05/2023 7:21 AM

## 2023-01-05 NOTE — Progress Notes (Signed)
ANTICOAGULATION CONSULT NOTE - Follow Up Consult  Pharmacy Consult for Heparin Indication: pulmonary embolus  Allergies  Allergen Reactions   Nsaids Other (See Comments)    Crohns disease/ IBD   Chlorhexidine Gluconate Itching    Able to use CHG swabs for ports, reaction with direct skin contact   Hydromorphone Other (See Comments)    Confusion/sedation   Lactose Intolerance (Gi) Other (See Comments)    Upset stomach/diarrhea    Patient Measurements: Height: 5\' 4"  (162.6 cm) Weight: 57.8 kg (127 lb 6.8 oz) IBW/kg (Calculated) : 54.7 Heparin Dosing Weight: TBW  Vital Signs: Temp: 97.7 F (36.5 C) (09/26 2346) Temp Source: Oral (09/26 2000) BP: 103/70 (09/26 2340) Pulse Rate: 103 (09/26 2340)  Labs: Recent Labs    01/02/23 0517 01/03/23 0632 01/03/23 1626 01/04/23 0500 01/04/23 0600 01/04/23 1808 01/05/23 0203  HGB 7.7* 7.6*  --  7.7*  --   --   --   HCT 24.7* 24.3*  --  24.5*  --   --   --   PLT 340 348  --  388  --   --   --   HEPARINUNFRC 0.31 0.23*   < > 0.12*  --  0.59 0.49  CREATININE 0.86  --   --   --  0.64  --   --    < > = values in this interval not displayed.    Estimated Creatinine Clearance: 76.7 mL/min (by C-G formula based on SCr of 0.64 mg/dL).   Medications:  Infusions:   ciprofloxacin Stopped (01/04/23 2325)   heparin 2,000 Units/hr (01/04/23 2346)   metronidazole Stopped (01/04/23 2323)   micafungin (MYCAMINE) 100 mg in sodium chloride 0.9 % 100 mL IVPB Stopped (01/04/23 1556)   phytonadione (VITAMIN K) 2 mg in dextrose 5 % 50 mL IVPB Stopped (01/04/23 1559)    Assessment: Brief note: Pharmacy is dosing heparin for new PE.   Heparin level 0.49, therapeutic on heparin 2000 units/hr CBC to be drawn later this AM. No complications of therapy noted     Goal of Therapy:  Heparin level 0.3-0.7 units/ml Monitor platelets by anticoagulation protocol: Yes   Plan:  No bolus per previous MD order Continue heparin IV infusion at 2000  units/hr - STOP heparin on 9/27 at 6:00 AM prior to planned port removal at 12:00.  F/u post procedure anticoagulation plans.  F/u anticoagulation plans following port removal  Terrilee Files, PharmD 01/05/2023 2:55 AM

## 2023-01-05 NOTE — Op Note (Signed)
REMOVAL PORT-A-CATH  Procedure Note  [name] 01/05/2023   Pre-op Diagnosis: FEVER OF UNKNOWN ORIGIN     Post-op Diagnosis: same  Procedure(s): REMOVAL PORT-A-CATH  Surgeon(s): Abigail Miyamoto, MD  Anesthesia: Monitor Anesthesia Care  Staff:  Circulator: Renaye Rakers, RN Scrub Person: Georgette Shell A  Estimated Blood Loss: Minimal               Indications: This is a 47 year old female who was in the hospital recovering from exploratory laparotomy for ischemic small bowel.  She has a history of Crohn's disease as well as metastatic breast cancer.  Because of persistent fevers without a clear source, Port-A-Cath removal was recommended by infectious disease  Procedure: The patient was brought to the operating identifies correct patient.  She was placed upon the operating table and anesthesia was induced.  Her left chest and neck were then prepped and draped in usual sterile fashion.  I anesthetized the skin at her previous incision with lidocaine and then made incision with a scalpel.  I then dissected down to the port with the cautery.  I excised the 1 suture holding the port in place and then was able to remove removed the port and catheter easily.  I closed the catheter tract with a figure-of-eight 3-0 Vicryl suture.  I then closed the subcutaneous tissue with interrupted 3-0 Vicryl sutures and closed the skin with a running 4-0 Monocryl.  There was no fluid or purulence at the port site.  Dermabond was then applied.  The patient tolerated the procedure well.  All the counts were correct at the end of the procedure.  She was then taken in a stable condition from the operating room to the recovery room.          Abigail Miyamoto   Date: 01/05/2023  Time: 9:09 AM

## 2023-01-05 NOTE — TOC Progression Note (Signed)
Transition of Care [facility]) - Progression Note    Patient Details  Name: [name] MRN: [id number] Date of Birth: 08-Apr-1976  [facility]) CM/SW Contact  Lavenia Atlas, RN Phone Number: 01/05/2023, 5:09 PM  Clinical Narrative:   Per chart review patient remains in [facility] ICU. PT recommends short term SNF, awaiting to be more medically stable. Created [id number]. Patient is not appropriate for CIR. Will need central line for TPN.   TOC will continue to follow for discharge needs.     Expected Discharge Plan: Home/Self Care (unsure at this time) Barriers to Discharge: Continued Medical Work up  Expected Discharge Plan and Services In-house Referral: Hospice / Palliative Care Discharge Planning Services: CM Consult Post Acute Care Choice: NA Living arrangements for the past 2 months: Single Family Home                 DME Arranged: N/A DME Agency: NA       HH Arranged: NA HH Agency: NA         Social Determinants of Health (SDOH) Interventions SDOH Screenings   Food Insecurity: No Food Insecurity (11/21/2022)  Housing: Patient Unable To Answer (11/21/2022)  Transportation Needs: No Transportation Needs (11/21/2022)  Utilities: Not At Risk (11/21/2022)  Tobacco Use: Medium Risk (01/05/2023)    Readmission Risk Interventions    12/20/2022    3:50 PM 12/15/2022    2:43 PM  Readmission Risk Prevention Plan  Transportation Screening Complete Complete  PCP or Specialist Appt within 3-5 Days  Complete  HRI or Home Care Consult  Complete  Social Work Consult for Recovery Care Planning/Counseling  Complete  Palliative Care Screening  Complete  Medication Review Ocean[employer]) Complete Complete  PCP or Specialist appointment within 3-5 days of discharge Complete   HRI or Home Care Consult Complete   SW Recovery Care/Counseling Consult Complete   Palliative Care Screening Complete   Skilled Nursing Facility Not Applicable

## 2023-01-05 NOTE — Plan of Care (Signed)
  Problem: Coping: Goal: Ability to adjust to condition or change in health will improve Outcome: Progressing   Problem: Fluid Volume: Goal: Ability to maintain a balanced intake and output will improve Outcome: Progressing   Problem: Skin Integrity: Goal: Risk for impaired skin integrity will decrease Outcome: Progressing   Problem: Nutritional: Goal: Maintenance of adequate nutrition will improve Outcome: Not Progressing

## 2023-01-05 NOTE — Transfer of Care (Signed)
Immediate Anesthesia Transfer of Care Note  Patient: [name]  Procedure(s) Performed: REMOVAL PORT-A-CATH (Left)  Patient Location: PACU  Anesthesia Type:MAC  Level of Consciousness: drowsy  Airway & Oxygen Therapy: Patient Spontanous Breathing and Patient connected to face mask oxygen  Post-op Assessment: Report given to RN and Post -op Vital signs reviewed and stable  Post vital signs: Reviewed and stable  Last Vitals:  Vitals Value Taken Time  BP    Temp    Pulse 114 01/05/23 0919  Resp 19 01/05/23 0918  SpO2 100 % 01/05/23 0919  Vitals shown include unfiled device data.  Last Pain:  Vitals:   01/05/23 0811  TempSrc:   PainSc: 0-No pain      Patients Stated Pain Goal: 0 (01/03/23 0428)  Complications: No notable events documented.

## 2023-01-06 ENCOUNTER — Encounter (HOSPITAL_COMMUNITY): Payer: Self-pay | Admitting: Surgery

## 2023-01-06 ENCOUNTER — Other Ambulatory Visit: Payer: Self-pay

## 2023-01-06 DIAGNOSIS — K90829 Short bowel syndrome, unspecified: Secondary | ICD-10-CM | POA: Diagnosis not present

## 2023-01-06 LAB — CBC
HCT: 25.5 % — ABNORMAL LOW (ref 36.0–46.0)
Hemoglobin: 8 g/dL — ABNORMAL LOW (ref 12.0–15.0)
MCH: 28.6 pg (ref 26.0–34.0)
MCHC: 31.4 g/dL (ref 30.0–36.0)
MCV: 91.1 fL (ref 80.0–100.0)
Platelets: 448 10*3/uL — ABNORMAL HIGH (ref 150–400)
RBC: 2.8 MIL/uL — ABNORMAL LOW (ref 3.87–5.11)
RDW: 18.9 % — ABNORMAL HIGH (ref 11.5–15.5)
WBC: 22.5 10*3/uL — ABNORMAL HIGH (ref 4.0–10.5)
nRBC: 0 % (ref 0.0–0.2)

## 2023-01-06 LAB — AEROBIC/ANAEROBIC CULTURE W GRAM STAIN (SURGICAL/DEEP WOUND): Culture: NO GROWTH

## 2023-01-06 LAB — GLUCOSE, CAPILLARY
Glucose-Capillary: 106 mg/dL — ABNORMAL HIGH (ref 70–99)
Glucose-Capillary: 125 mg/dL — ABNORMAL HIGH (ref 70–99)
Glucose-Capillary: 96 mg/dL (ref 70–99)
Glucose-Capillary: 97 mg/dL (ref 70–99)

## 2023-01-06 LAB — HEPARIN LEVEL (UNFRACTIONATED)
Heparin Unfractionated: 0.75 [IU]/mL — ABNORMAL HIGH (ref 0.30–0.70)
Heparin Unfractionated: 0.63 [IU]/mL (ref 0.30–0.70)

## 2023-01-06 MED ORDER — HEPARIN (PORCINE) 25000 UT/250ML-% IV SOLN
1800.0000 [IU]/h | INTRAVENOUS | Status: DC
  Administered 2023-01-06 – 2023-01-07 (×3): 1900 [IU]/h via INTRAVENOUS
  Administered 2023-01-08 – 2023-01-11 (×5): 1800 [IU]/h via INTRAVENOUS
  Filled 2023-01-06 (×8): qty 250

## 2023-01-06 NOTE — Anesthesia Postprocedure Evaluation (Signed)
Anesthesia Post Note  Patient: [name]  Procedure(s) Performed: REMOVAL PORT-A-CATH (Left)     Patient location during evaluation: PACU Anesthesia Type: MAC Level of consciousness: awake and alert Pain management: pain level controlled Vital Signs Assessment: post-procedure vital signs reviewed and stable Respiratory status: spontaneous breathing, nonlabored ventilation, respiratory function stable and patient connected to nasal cannula oxygen Cardiovascular status: stable and blood pressure returned to baseline Postop Assessment: no apparent nausea or vomiting Anesthetic complications: no   No notable events documented.  Last Vitals:  Vitals:   01/06/23 1130 01/06/23 1210  BP:    Pulse: (!) 125   Resp: (!) 21   Temp:  37.5 C  SpO2: 100%     Last Pain:  Vitals:   01/06/23 1310  TempSrc:   PainSc: 1                  Korrey Schleicher

## 2023-01-06 NOTE — Progress Notes (Signed)
ANTICOAGULATION CONSULT NOTE  Pharmacy Consult for heparin Indication: pulmonary embolus  Allergies  Allergen Reactions   Nsaids Other (See Comments)    Crohns disease/ IBD   Chlorhexidine Gluconate Itching    Able to use CHG swabs for ports, reaction with direct skin contact   Hydromorphone Other (See Comments)    Confusion/sedation   Lactose Intolerance (Gi) Other (See Comments)    Upset stomach/diarrhea    Patient Measurements: Height: 5\' 4"  (162.6 cm) Weight: 58.4 kg (128 lb 12 oz) IBW/kg (Calculated) : 54.7 Heparin Dosing Weight: TBW  Vital Signs: Temp: 98.2 F (36.8 C) (09/28 1640) Temp Source: Oral (09/28 1640) BP: 112/74 (09/28 1100) Pulse Rate: 123 (09/28 1630)  Labs: Recent Labs    01/04/23 0500 01/04/23 0600 01/04/23 1808 01/05/23 0203 01/05/23 0506 01/06/23 0700 01/06/23 1641  HGB 7.7*  --   --   --  7.6* 8.0*  --   HCT 24.5*  --   --   --  23.9* 25.5*  --   PLT 388  --   --   --  381 448*  --   HEPARINUNFRC 0.12*  --    < > 0.49  --  0.75* 0.63  CREATININE  --  0.64  --   --   --   --   --    < > = values in this interval not displayed.    Estimated Creatinine Clearance: 76.7 mL/min (by C-G formula based on SCr of 0.64 mg/dL).   Medications:  No AC PTA.  Assessment: 47 yo F with a PMH of recurrent breast cancer not currently on treatment, who presented to the ED on 12/12/2022 with abdominal pain, nausea, vomiting diarrhea.   Patient was placed on heparin drip from 9/10-9/12 for new LLL PE. On 9/12 heparin drip was discontinued due to hemoglobin dropping and intermittent bleeding in ostomy.   On 9/21: Pharmacy has been consulted to resume heparin drip after transfusion (Hgb 7, transfused 1 unit PRBC) On 9/27 heparin held for [facility], resumed w/ no bolus at 13:00  01/06/2023 AM Heparin level 0.63 therapeutic with heparin infusion at 1900 units/hr Hg 8 low but stable, PLT WNL No bleeding reported   Goal of Therapy:  Heparin level 0.3-0.7  units/ml Monitor platelets by anticoagulation protocol: Yes   Plan:  Continue heparin to 1900 units/hr   Daily CBC & Heparin level with AM labs  Monitor for signs/symptoms of bleeding Follow up long-term anticoagulation plans     Adalberto Cole, PharmD, BCPS 01/06/2023 6:40 PM

## 2023-01-06 NOTE — Progress Notes (Signed)
PROGRESS NOTE    [name]  UJW:[id number] DOB: 10-11-1975 DOA: 12/12/2022 PCP: Ollen Bowl, MD   Brief Narrative:  [name] is a 47 y.o. female with PMH significant for Crohn's disease with previous bowel obstruction s/p subtotal colectomy 2021, colonic stricture, IDA, breast cancer s/p mastectomy 2022 currently not on treatment. Recently hospitalized 8/10-8/15/2024 for bowel obstruction seen by GI and general surgery. Underwent EGD and flexible sigmoidoscopy and discharged on steroids.   9/3, patient presented to the ED with abdominal distention, pain, nausea, vomiting CT abd/pelvis showed markedly dilated distal small bowel consistent with chronic obstruction. No changes since prior study.  Patient was admitted to [facility] with a plan of GI consultation.   9/4, in the morning, within few hours of admission, patient was found pulseless on the floor in the bathroom in ED, estimated downtime about 5 minutes. ACLS was initiated. Concern for possible torsades de pointes and given IV mag, defibrillated.  ROSC achieved in about 5 minutes. She was intubated. She had copious vomitus in the ETT.  Notably, she had I-stat peri-code which showed hemoglobin of 5.5 down from 10 overnight.  Admitted to ICU In the ICU, patient had right femoral CVC placed.  Given 3 minutes of PRBC, 3 units of FFP.  Patient was started on vasopressors   9/5, underwent exploratory laparotomy.  Noted to have internal hernia with associated small bowel ischemia.  Underwent bowel resection. 9/7, back to OR for ostomy placement 9/8, weaned off Levophed 9/10, extubated 9/10, found to have PE in CT angio chest.  Started on heparin drip which was subsequently stopped the next day because of drop in hemoglobin 9/12, mental status improved, became more interactive.  However failed swallow eval. 9/16, transferred out to [facility] 9/16, CT abdomen showed peritoneal fluid collection 9/17, underwent paracentesis, 500 cc of  clear yellow fluid drained 9/22, CT abdomen raised concern of early developing obstruction, peritonitis.  9/23, paracentesis by IR.  Drained 90 cc of turbid, amber/blood-tinged fluid   Patient's hospitalization has been prolonged because of persistent fever, persistent high output from ostomy, anemia requiring blood transfusion, dysphagia.  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 Hillery Hunter s/p ex lap, ileostomy, abdominal closure 9/7 Dr. Hillery Hunter Currently only has 100 cm of small bowel left and hence started syndrome.  Continues to have significant ostomy output.   TPN dependent To slow down ostomy output, patient is currently on Imodium, iron, FiberCon, Imodium.  Surgery following and managing.   Dysphagia Postextubation, patient failed speech therapy  reevaluation multiple amps.  Gradually improving and currently on dysphagia 3 diet, tube feeding during the day and TPN during the night.    Persistent sepsis/fever of unknown origin Patient continued to  have persistent fever, tachycardia and leukocytosis Extensive workup done.  9/19, ID consulted.  Currently on ciprofloxacin, Flagyl and micafungin. 9/22, CT abdomen raised concern of early developing obstruction, peritonitis. 9/23, paracentesis by IR.  Drained 90 cc of turbid, amber/blood-tinged fluid, culture negative.  Seen by ID 01/03/2023, they recommend line holiday.  Central line was removed on 01/04/2023.  Patient's last temperature spike was prior to that and was 100.4 at 7 AM same day.  Patient's port was removed by general surgery on 01/05/2023 as well.  Her tachycardia has also improved.  She will need central line again for TPN, per general surgery. Repeat blood culture obtained 01/03/2023 and negative so far.  Persistent tachycardia Primarily due to sepsis but also contributed by effective hypovolemia and pulm embolism. Currently on Metropol 50 mg twice daily.  Tachycardia is improving.   AKI Likely due to ATN related to septic shock.  Resolved.  Pulmonary embolism Left lower lobe segmental PE diagnosed on 9/10, initially started on heparin which was stopped after 1 day due to anemia but then resumed on 12/30/2022.   Acute on chronic anemia Hemoglobin trend as below.  Received a total of 9 units of PRBC so far. 9/21, heparin drip was resumed.  Hemoglobin fairly stable since 12/31/2022.  No active bleeding.  Continue to monitor.  Hemoglobin has remained stable around 7.5 for the last few days.  Hyperglycemia A1c 5.7 on 9//24 Continue SSI/Accu-Cheks  Loss of voice/hoarse voice Postextubation, patient was noted to have dysphagia as well as loss of voice. If no improvement, we'll involve ENT after acute issues resolve however per patient, her voice is improving but very slowly.   Recurrent breast cancer: Follows up with oncology Dr. Pamelia Hoit   DVT prophylaxis: Place and maintain sequential compression device Start: 12/15/22 1031 SCDs Start: 12/13/22 0413   Code Status: Full Code   Family Communication:  None present at bedside.  Plan of care discussed with patient in length and he/she verbalized understanding and agreed with it.  Status is: Inpatient Remains inpatient appropriate because: Patient is still very sick with multiple problems.     Estimated body mass index is 22.1 kg/m as calculated from the following:   Height as of this encounter: 5\' 4"  (1.626 m).   Weight as of this encounter: 58.4 kg.    Nutritional Assessment: Body mass index is 22.1 kg/m.Marland Kitchen Seen by dietician.  I agree with the assessment and plan as outlined below: Nutrition Status: Nutrition Problem: Moderate Malnutrition Etiology: chronic illness (Crohn's disease; metastatic breast cancer) Signs/Symptoms: moderate fat depletion, moderate muscle depletion, percent weight loss (24% in 4.5 months) Percent weight loss: 24 % (in 4.5 months) Interventions: Tube feeding, TPN  . Skin Assessment: I have examined the patient's skin and I agree with the wound assessment as performed by the wound care RN as outlined below:    Consultants:  ID and general surgery  Procedures:  As above  Antimicrobials:  Anti-infectives (From admission, onward)    Start     Dose/Rate Route Frequency Ordered Stop   12/29/22 1215  ciprofloxacin (CIPRO) IVPB 400 mg        400 mg 200 mL/hr over 60 Minutes Intravenous 2 times daily 12/29/22 1123     12/29/22 1215  metroNIDAZOLE (FLAGYL) IVPB 500 mg  500 mg 100 mL/hr over 60 Minutes Intravenous 2 times daily 12/29/22 1123     12/28/22 1430  micafungin (MYCAMINE) 100 mg in sodium chloride 0.9 % 100 mL IVPB  Status:  Discontinued        100 mg 105 mL/hr over 1 Hours Intravenous Every 24 hours 12/28/22 1335 01/05/23 0731   12/23/22 2000  vancomycin (VANCOREADY) IVPB 1250 mg/250 mL  Status:  Discontinued        1,250 mg 166.7 mL/hr over 90 Minutes Intravenous Every 36 hours 12/22/22 0612 12/22/22 1103   12/22/22 0645  vancomycin (VANCOREADY) IVPB 1250  mg/250 mL        1,250 mg 166.7 mL/hr over 90 Minutes Intravenous  Once 12/22/22 0551 12/22/22 0805   12/22/22 0600  piperacillin-tazobactam (ZOSYN) IVPB 3.375 g  Status:  Discontinued        3.375 g 12.5 mL/hr over 240 Minutes Intravenous Every 8 hours 12/22/22 0551 12/29/22 1123   12/13/22 1000  piperacillin-tazobactam (ZOSYN) IVPB 3.375 g  Status:  Discontinued        3.375 g 12.5 mL/hr over 240 Minutes Intravenous Every 8 hours 12/13/22 0833 12/20/22 1333         Subjective: Patient seen and examined.  She has no complaints today.  Objective: Vitals:   01/06/23 0400 01/06/23 0500 01/06/23 0601 01/06/23 0810  BP: 99/62  111/67   Pulse: (!) 116  88   Resp: (!) 21  18   Temp:    98.2 F (36.8 C)  TempSrc:    Oral  SpO2: 99%  100%   Weight:  58.4 kg    Height:        Intake/Output Summary (Last 24 hours) at 01/06/2023 1000 Last data filed at 01/06/2023 0810 Gross per 24 hour  Intake 1435.76 ml  Output 2825 ml  Net -1389.24 ml   Filed Weights   01/04/23 0500 01/05/23 0811 01/06/23 0500  Weight: 57.8 kg 57.8 kg 58.4 kg    Examination:  General exam: Appears calm and comfortable  Respiratory system: Clear to auscultation. Respiratory effort normal. Cardiovascular system: S1 & S2 heard, RRR. No JVD, murmurs, rubs, gallops or clicks. No pedal edema. Gastrointestinal system: Abdomen is nondistended, soft and mild generalized tenderness. No organomegaly or masses felt. Normal bowel sounds heard. Central nervous system: Alert and oriented. No focal neurological deficits.  Hoarse voice Extremities: Symmetric 5 x 5 power. Skin: No rashes, lesions or ulcers.  Psychiatry: Judgement and insight appear normal. Mood & affect appropriate.    Data Reviewed: I have personally reviewed following labs and imaging studies  CBC: Recent Labs  Lab 01/02/23 0517 01/03/23 0632 01/04/23 0500 01/05/23 0506 01/06/23 0700  WBC 18.6* 20.1* 23.0* 24.3* 22.5*  HGB 7.7* 7.6* 7.7* 7.6*  8.0*  HCT 24.7* 24.3* 24.5* 23.9* 25.5*  MCV 90.8 90.7 89.4 88.5 91.1  PLT 340 348 388 381 448*   Basic Metabolic Panel: Recent Labs  Lab 12/31/22 0504 01/01/23 0456 01/01/23 0617 01/02/23 0517 01/04/23 0500 01/04/23 0600  NA 134* 135 133* 138  --  134*  K 4.3 3.8 3.8 4.2  --  4.2  CL 101 102 101 103  --  101  CO2 25 25 27 26   --  25  GLUCOSE 121* 149* 121* 121*  --  129*  BUN 35* 33* 34* 33*  --  27*  CREATININE 0.72 1.01* 0.94 0.86  --  0.64  CALCIUM 8.1* 7.2* 7.2* 8.0*  --  8.0*  MG  2.0 1.9  --   --  1.8  --   PHOS 2.9 3.1  --   --  3.5  --    GFR: Estimated Creatinine Clearance: 76.7 mL/min (by C-G formula based on SCr of 0.64 mg/dL). Liver Function Tests: Recent Labs  Lab 01/01/23 0617 01/04/23 0600  AST 32 44*  ALT 26 30  ALKPHOS 148* 196*  BILITOT 0.4 0.7  PROT 5.6* 6.2*  ALBUMIN <1.5* 1.5*   No results for input(s): "LIPASE", "AMYLASE" in the last 168 hours. No results for input(s): "AMMONIA" in the last 168 hours. Coagulation Profile: Recent Labs  Lab 12/30/22 1338  INR 1.3*   Cardiac Enzymes: No results for input(s): "CKTOTAL", "CKMB", "CKMBINDEX", "TROPONINI" in the last 168 hours. BNP (last 3 results) No results for input(s): "PROBNP" in the last 8760 hours. HbA1C: No results for input(s): "HGBA1C" in the last 72 hours. CBG: Recent Labs  Lab 01/04/23 2352 01/05/23 1145 01/05/23 2029 01/06/23 0057 01/06/23 0801  GLUCAP 102* 94 96 106* 96   Lipid Profile: Recent Labs    01/04/23 0500  TRIG 53   Thyroid Function Tests: No results for input(s): "TSH", "T4TOTAL", "FREET4", "T3FREE", "THYROIDAB" in the last 72 hours. Anemia Panel: No results for input(s): "VITAMINB12", "FOLATE", "FERRITIN", "TIBC", "IRON", "RETICCTPCT" in the last 72 hours. Sepsis Labs: Recent Labs  Lab 01/03/23 1257 01/04/23 0924  PROCALCITON  --  0.43  LATICACIDVEN 1.2  --     Recent Results (from the past 240 hour(s))  Fungus culture, blood     Status: None    Collection Time: 12/27/22  1:07 PM   Specimen: BLOOD  Result Value Ref Range Status   Specimen Description   Final    BLOOD SITE NOT SPECIFIED Performed at [facility], [address], [place], [place] [address]    Special Requests   Final    BOTTLES DRAWN AEROBIC ONLY Blood Culture adequate volume Performed at [facility], [address] [place], [place] [address]    Culture   Final    NO GROWTH 7 DAYS NO FUNGUS ISOLATED Performed at [facility], [address] [place], [place] [address]    Report Status 01/03/2023 FINAL  Final  Culture, blood (Routine X 2) w Reflex to ID Panel     Status: None   Collection Time: 12/27/22  1:07 PM   Specimen: BLOOD RIGHT ARM  Result Value Ref Range Status   Specimen Description   Final    BLOOD RIGHT ARM Performed at [facility], [address] [place], [place] [address]    Special Requests   Final    BOTTLES DRAWN AEROBIC AND ANAEROBIC Blood Culture adequate volume Performed at [facility], [address] [place], [place] [address]    Culture   Final    NO GROWTH 5 DAYS Performed at [facility], [address] [place], [place] [address]    Report Status 01/01/2023 FINAL  Final  Culture, blood (Routine X 2) w Reflex to ID Panel     Status: None   Collection Time: 12/27/22  1:07 PM   Specimen: BLOOD LEFT ARM  Result Value Ref Range Status   Specimen Description   Final    BLOOD LEFT ARM Performed at [facility], [address], [place], [place] [address]    Special Requests   Final    BOTTLES DRAWN AEROBIC ONLY Blood Culture adequate volume Performed  at [facility], [address], [place], [place] [address]    Culture   Final    NO GROWTH 5 DAYS Performed at [facility], [address], [place], [place] [address]    Report Status 01/01/2023 FINAL  Final  Culture, fungus without smear     Status: None  (Preliminary result)   Collection Time: 01/01/23 11:52 AM   Specimen: Peritoneal Washings; Other  Result Value Ref Range Status   Specimen Description   Final    PERITONEAL Performed at [facility], [address], [place], [place] [address]    Special Requests   Final    NONE Performed at [facility], [address], [place], [place] [address]    Culture   Final    NO GROWTH 4 DAYS Performed at [facility], [address], [place], [place] [address]    Report Status PENDING  Incomplete  Acid Fast Smear (AFB)     Status: None   Collection Time: 01/01/23 11:52 AM   Specimen: Peritoneal  Result Value Ref Range Status   AFB Specimen Processing Concentration  Final   Acid Fast Smear Negative  Final    Comment: (NOTE) Performed At: [facility] [address] [place], [place] [id number] Jolene Schimke MD [id number]    Source (AFB) NONE  Final    Comment: Performed at [facility], [address], [place], [place] [address]  Aerobic/Anaerobic Culture w Gram Stain (surgical/deep wound)     Status: None (Preliminary result)   Collection Time: 01/01/23 11:52 AM   Specimen: Peritoneal Washings  Result Value Ref Range Status   Specimen Description   Final    PERITONEAL Performed at [facility], [address], [place], [place] [address]    Special Requests   Final    NONE Performed at [facility], [address] [place], [place] [address]    Gram Stain   Final    ABUNDANT WBC PRESENT, PREDOMINANTLY MONONUCLEAR NO ORGANISMS SEEN    Culture   Final    NO GROWTH 4 DAYS NO ANAEROBES ISOLATED; CULTURE IN PROGRESS FOR 5 DAYS Performed at [facility], [address], [place], [place] [address]    Report Status PENDING  Incomplete  Culture, blood (Routine X 2) w Reflex to ID Panel     Status: None (Preliminary result)   Collection Time: 01/03/23  5:32 PM    Specimen: BLOOD  Result Value Ref Range Status   Specimen Description   Final    BLOOD PICC LINE Performed at [facility], [address] [place], [place] [address]    Special Requests   Final    BOTTLES DRAWN AEROBIC AND ANAEROBIC Blood Culture adequate volume Performed at [facility], [address] [place], [place] [address]    Culture   Final    NO GROWTH 3 DAYS Performed at [facility], [address], [place], [place] [address]    Report Status PENDING  Incomplete  Culture, blood (Routine X 2) w Reflex to ID Panel     Status: None (Preliminary result)   Collection Time: 01/03/23  5:32 PM   Specimen: BLOOD LEFT FOREARM  Result Value Ref Range Status   Specimen Description   Final    BLOOD LEFT FOREARM Performed at [facility], [address], [place], [place] [address]  Special Requests   Final    BOTTLES DRAWN AEROBIC AND ANAEROBIC Blood Culture adequate volume Performed at [facility], [address], [place], [place] [address]    Culture   Final    NO GROWTH 3 DAYS Performed at [facility], [address] [place], [place] [address]    Report Status PENDING  Incomplete  Culture, blood (Routine X 2) w Reflex to ID Panel     Status: None (Preliminary result)   Collection Time: 01/04/23 11:40 PM   Specimen: BLOOD LEFT HAND  Result Value Ref Range Status   Specimen Description   Final    BLOOD LEFT HAND Performed at [facility], [address], [place], [place] [address]    Special Requests   Final    BOTTLES DRAWN AEROBIC ONLY Blood Culture adequate volume Performed at [facility], [address] [place], [place] [address]    Culture   Final    NO GROWTH 1 DAY Performed at [facility], [address], [place], [place] [address]    Report Status PENDING  Incomplete  Culture, blood (Routine X 2) w Reflex to ID Panel     Status: None (Preliminary  result)   Collection Time: 01/04/23 11:40 PM   Specimen: BLOOD LEFT ARM  Result Value Ref Range Status   Specimen Description   Final    BLOOD LEFT ARM Performed at [facility], [address] [place], [place] [address]    Special Requests   Final    BOTTLES DRAWN AEROBIC ONLY Blood Culture results may not be optimal due to an inadequate volume of blood received in culture bottles Performed at [facility], [address] [place], [place] [address]    Culture   Final    NO GROWTH 1 DAY Performed at [facility], [address], [place], [place] [address]    Report Status PENDING  Incomplete     Radiology Studies: No results found.  Scheduled Meds:  ascorbic acid  500 mg Per Tube BID   cholecalciferol  400 Units Per Tube Daily   diphenoxylate-atropine  1 tablet Per Tube QID   famotidine  20 mg Per Tube Daily   feeding supplement (KATE FARMS STANDARD 1.4)  325 mL Oral BID BM   feeding supplement (KATE FARMS STANDARD 1.4)  360 mL Per Tube Q24H   ferrous sulfate  300 mg Per Tube BID   fiber  1 packet Per Tube TID   gabapentin  200 mg Per Tube Q8H   insulin aspart  0-9 Units Subcutaneous 4 times per day   loperamide HCl  4 mg Per Tube QID   metoprolol tartrate  50 mg Per Tube BID   mouth rinse  15 mL Mouth Rinse 4 times per day   sodium chloride flush  10-40 mL Intracatheter Q12H   Vitamin D (Ergocalciferol)  50,000 Units Oral Q7 days   vitamin E  100 Units Per Tube Daily   zinc sulfate  220 mg Per Tube Daily   Continuous Infusions:  ciprofloxacin Stopped (01/06/23 0042)   heparin 1,900 Units/hr (01/06/23 0912)   metronidazole Stopped (01/06/23 0041)   phytonadione (VITAMIN K) 2 mg in dextrose 5 % 50 mL IVPB Stopped (01/04/23 1559)     LOS: 24 days   Hughie Closs, MD [facility]  01/06/2023, 10:00 AM   *Please note that this is a verbal dictation therefore  any spelling or grammatical errors are due to the "[employer]agon [employer] One"  system interpretation.  Please page via Amion and do not message via secure chat for urgent patient care matters. Secure chat can be used for non urgent patient care matters.  How to contact the [facility] Attending or Consulting provider 7A - 7P or covering provider during after hours 7P -7A, for this patient?  Check the care team in [facility] and look for a) attending/consulting [facility] provider listed and b) the [facility] team listed. Page or secure chat 7A-7P. Log into [email or web address] and use 's universal password to access. If you do not have the password, please contact the hospital operator. Locate the [facility] provider you are looking for under [facility] and page to a number that you can be directly reached. If you still have difficulty reaching the provider, please page the [facility] (Director on Call) for the Hospitalists listed on amion for assistance.

## 2023-01-06 NOTE — Progress Notes (Signed)
Occupational Therapy Treatment Patient Details Name: [name] MRN: [id number] DOB: 1975-09-27 Today's Date: 01/06/2023   History of present illness Patient is a 47 year old female who presented on 9/3 with abdominal distension and nausea/vomiting. 9/4 patient was found in ED bathroom with no pulse with ROSC in about 5 mins. Patient was intubated at this time. 9/5 patient underwent exploratory laparotomy that revealed internal hernia with associated small bowel ischemia with patient undergoing bowel resection. 9/7 patient returned to OR for ostomy placement. Patient was extubated on 9/10 and also found to have PE. PMH: breast cancer, Chron's disease, IDA, recent hospitalization 8/10 to 8/15 with bowel obstruction and EGD   OT comments  Patient was able to engage in LB dressing for socks with figure four positioning at bed level with less A than compared to last session. Patient better able to move RLE and hold figure four position today. Patient was motivated to get on her feet more today. Patient's HR up to 155 bpm with functional mobility in hallway to increase functional activity tolerance with Adls and reduce transfer A. Patient would continue to benefit from skilled OT services at this time while admitted and after d/c to address noted deficits in order to improve overall safety and independence in ADLs. Patient will benefit from intensive inpatient follow up therapy, >3 hours/day       If plan is discharge home, recommend the following:  Two people to help with walking and/or transfers;Two people to help with bathing/dressing/bathroom;Direct supervision/assist for medications management;Assistance with cooking/housework;Assist for transportation;Help with stairs or ramp for entrance;Direct supervision/assist for financial management   Equipment Recommendations  None recommended by OT       Precautions / Restrictions Precautions Precautions: Fall Precaution Comments: abdominal  precautions, wound vac, abominal binder, ostomy, nasal tube Restrictions Weight Bearing Restrictions: No       Mobility Bed Mobility Overal bed mobility: Needs Assistance Bed Mobility: Supine to Sit     Supine to sit: Min assist     General bed mobility comments: with cues for proper positioning         Balance Overall balance assessment: Needs assistance Sitting-balance support: Bilateral upper extremity supported, Feet supported Sitting balance-Leahy Scale: Fair     Standing balance support: Reliant on assistive device for balance, Single extremity supported, During functional activity Standing balance-Leahy Scale: Poor       ADL either performed or assessed with clinical judgement   ADL Overall ADL's : Needs assistance/impaired       Lower Body Dressing: Minimal assistance;Bed level Lower Body Dressing Details (indicate cue type and reason): figure four positioning of BLE at bed level. patietn was able to hold RLE in palce and don sock today with increased time. patient reported she had been practicing. Toilet Transfer: +2 for safety/equipment;Minimal assistance;Contact guard assist Toilet Transfer Details (indicate cue type and reason): to participate in functional mobility into hallway with increased time. NT present during session.                  Cognition Arousal: Alert Behavior During Therapy: WFL for tasks assessed/performed Overall Cognitive Status: Within Functional Limits for tasks assessed         General Comments: soft to no voice. motivated to participate                   Pertinent Vitals/ Pain       Pain Assessment Pain Assessment: No/denies pain  Frequency  Min 2X/week        Progress Toward Goals  OT Goals(current goals can now be found in the care plan section)  Progress towards OT goals: Progressing toward goals     Plan         AM-PAC OT "6 Clicks" Daily Activity     Outcome Measure   Help from  another person eating meals?: A Little Help from another person taking care of personal grooming?: A Lot Help from another person toileting, which includes using toliet, bedpan, or urinal?: A Lot Help from another person bathing (including washing, rinsing, drying)?: A Lot Help from another person to put on and taking off regular upper body clothing?: A Lot Help from another person to put on and taking off regular lower body clothing?: A Lot 6 Click Score: 13    End of Session Equipment Utilized During Treatment: Rolling walker (2 wheels);Other (comment) (abdominal binder)  OT Visit Diagnosis: Unsteadiness on feet (R26.81);Other abnormalities of gait and mobility (R26.89);Pain   Activity Tolerance Patient tolerated treatment well;Patient limited by fatigue   Patient Left with call bell/phone within reach;in chair   Nurse Communication Mobility status        Time: 1610-9604 OT Time Calculation (min): 23 min  Charges: OT General Charges $OT Visit: 1 Visit OT Treatments $Self Care/Home Management : 8-22 mins $Therapeutic Activity: 8-22 mins  Rosalio Loud, MS Acute Rehabilitation Department Office# [phone]   Selinda Flavin 01/06/2023, 1:49 PM

## 2023-01-06 NOTE — Progress Notes (Signed)
1 Day Post-Op   Subjective/Chief Complaint: Pt doing well this AM No complaints   Objective: Vital signs in last 24 hours: Temp:  [98 F (36.7 C)-99 F (37.2 C)] 98.9 F (37.2 C) (09/28 0315) Pulse Rate:  [88-124] 88 (09/28 0601) Resp:  [12-24] 18 (09/28 0601) BP: (84-123)/(49-82) 111/67 (09/28 0601) SpO2:  [94 %-100 %] 100 % (09/28 0601) Weight:  [57.8 kg-58.4 kg] 58.4 kg (09/28 0500) Last BM Date : 01/04/23  Intake/Output from previous day: 09/27 0701 - 09/28 0700 In: 2435.8 [I.V.:1349.2; NG/GT:490; IV Piggyback:596.6] Out: 2350 [Urine:450; Drains:300; Stool:1600] Intake/Output this shift: No intake/output data recorded.  General appearance: alert and cooperative GI: soft, non-tender; bowel sounds normal; no masses,  no organomegaly Incision/Wound: L shoulder PAC site incision c/d/i  Lab Results:  Recent Labs    01/04/23 0500 01/05/23 0506  WBC 23.0* 24.3*  HGB 7.7* 7.6*  HCT 24.5* 23.9*  PLT 388 381   BMET Recent Labs    01/04/23 0600  NA 134*  K 4.2  CL 101  CO2 25  GLUCOSE 129*  BUN 27*  CREATININE 0.64  CALCIUM 8.0*   PT/INR No results for input(s): "LABPROT", "INR" in the last 72 hours. ABG No results for input(s): "PHART", "HCO3" in the last 72 hours.  Invalid input(s): "PCO2", "PO2"  Studies/Results: No results found.  Anti-infectives: Anti-infectives (From admission, onward)    Start     Dose/Rate Route Frequency Ordered Stop   12/29/22 1215  ciprofloxacin (CIPRO) IVPB 400 mg        400 mg 200 mL/hr over 60 Minutes Intravenous 2 times daily 12/29/22 1123     12/29/22 1215  metroNIDAZOLE (FLAGYL) IVPB 500 mg        500 mg 100 mL/hr over 60 Minutes Intravenous 2 times daily 12/29/22 1123     12/28/22 1430  micafungin (MYCAMINE) 100 mg in sodium chloride 0.9 % 100 mL IVPB  Status:  Discontinued        100 mg 105 mL/hr over 1 Hours Intravenous Every 24 hours 12/28/22 1335 01/05/23 0731   12/23/22 2000  vancomycin (VANCOREADY) IVPB  1250 mg/250 mL  Status:  Discontinued        1,250 mg 166.7 mL/hr over 90 Minutes Intravenous Every 36 hours 12/22/22 0612 12/22/22 1103   12/22/22 0645  vancomycin (VANCOREADY) IVPB 1250 mg/250 mL        1,250 mg 166.7 mL/hr over 90 Minutes Intravenous  Once 12/22/22 0551 12/22/22 0805   12/22/22 0600  piperacillin-tazobactam (ZOSYN) IVPB 3.375 g  Status:  Discontinued        3.375 g 12.5 mL/hr over 240 Minutes Intravenous Every 8 hours 12/22/22 0551 12/29/22 1123   12/13/22 1000  piperacillin-tazobactam (ZOSYN) IVPB 3.375 g  Status:  Discontinued        3.375 g 12.5 mL/hr over 240 Minutes Intravenous Every 8 hours 12/13/22 2725 12/20/22 1333       Assessment/Plan: Hx Crohn's disease s/p subtotal colectomy  Internal hernia with bowel ischemia - POD 21 exploratory laparotomy with small bowel resection 9/5 Dr. Hillery Hunter - POD 19  s/p exploratory laparotomy, ileostomy, abdominal closure 9/7 Dr. 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 Highly likely to have high ostomy output  -POD 1 from PAC removal. Pre-alb 11 and Albumin < 1.5 on 9/19 - Imodium 4mg  QID, Iron 325mg  BID, Fibercon 625mg  TID. Added lomotil increased today to 1 mg QID. - DYS 3 diet,  nocturnal TF stopped 9/25. Will resume nocturnal TF while off of TPN with plans to stop them if PO intake improves. - Diet per SLP. DYS3 - poor PO intake at current, PO supplements per dietician. - WOCN following for new ostomy. Plan ostomy clinic referral at d/c - Midline wound with hx of bleeding. Resolved. Cont VAC M/W/F with white foam in base inferiorly. Healing well. - CT A/P 9/16 done. No indication for return to the OR. Low suspicion for bowel ischemia currently. Ascites without signs of infection on para. Lactate 9/25 WNL. If ongoing leukocytosis without any other source then will consider repeat CT A/P in 24-48h.  - ABx and anti-fungals per ID - PT/OT - Palliative following  peripherally currently.    Ongoing fever of unclear etiology. Low suspicion for abdominal source. CXR 9/25 ok, BCx 9/18 NGTD, peritoneal cultures 9/17 and 9/23 NGTD. Repeat BCx 9/25 pending. ID following. They request line holiday. Also ok to D/C foley from CCS standpoint.   ID - Zosyn 9/4>>9/11. 9/13 >> Micafungin 9/19 >> Patient still febrile with elevated leukocytosis.  LE [place] 9/18 neg. UE [place] 9/19 neg. UA neg. CXR w/ L > R opacities with b/l pleural effusions. Defer to [facility] if pleural effusions need further addressing. Resp Cx ordered. Bcx including specific fungal blood cx NGTD. PAC out 9/27   FEN - DYS 3 per SLP as above. Cyclic TPN, nocturnal TF @ 30 mL/hr; s/p 1,000 mL NS bolus 9/25 VTE - SCDs, Hep gtt Foley - Failed TOV. Foley in place with good UOP.      - per [facility] - PE - Noted on CTA 9/10. LE [place] negative for DVT. Heparin gtt. Hgb 7.5 from 8.6, agree with PM CBC. Agree with CCM that we may need to consider IVC temporary filter at some point if unable to restart heparin gtt (hx breast ca, recent surgery, immobilized). Shock - off pressors VDRF - extubated  Recent cardiac arrest 9/3 AKI - Cr stable on most recent labs Anemia - Transfuse to keep hemoglobin above 7. Consider IV iron supplementation - she has short gut so worried she will not absorb iron well orally. Last PT-INR 9/15 was 1.3.  Hx metastatic breast cancer   LOS: 24 days    Axel Filler 01/06/2023

## 2023-01-06 NOTE — Progress Notes (Signed)
ANTICOAGULATION CONSULT NOTE  Pharmacy Consult for heparin Indication: pulmonary embolus  Allergies  Allergen Reactions   Nsaids Other (See Comments)    Crohns disease/ IBD   Chlorhexidine Gluconate Itching    Able to use CHG swabs for ports, reaction with direct skin contact   Hydromorphone Other (See Comments)    Confusion/sedation   Lactose Intolerance (Gi) Other (See Comments)    Upset stomach/diarrhea    Patient Measurements: Height: 5\' 4"  (162.6 cm) Weight: 58.4 kg (128 lb 12 oz) IBW/kg (Calculated) : 54.7 Heparin Dosing Weight: TBW  Vital Signs: Temp: 98.2 F (36.8 C) (09/28 0810) Temp Source: Oral (09/28 0810) BP: 111/67 (09/28 0601) Pulse Rate: 88 (09/28 0601)  Labs: Recent Labs    01/04/23 0500 01/04/23 0600 01/04/23 1808 01/05/23 0203 01/05/23 0506 01/06/23 0700  HGB 7.7*  --   --   --  7.6* 8.0*  HCT 24.5*  --   --   --  23.9* 25.5*  PLT 388  --   --   --  381 448*  HEPARINUNFRC 0.12*  --  0.59 0.49  --  0.75*  CREATININE  --  0.64  --   --   --   --     Estimated Creatinine Clearance: 76.7 mL/min (by C-G formula based on SCr of 0.64 mg/dL).   Medications:  No AC PTA.  Assessment: 47 yo F with a PMH of recurrent breast cancer not currently on treatment, who presented to the ED on 12/12/2022 with abdominal pain, nausea, vomiting diarrhea.   Patient was placed on heparin drip from 9/10-9/12 for new LLL PE. On 9/12 heparin drip was discontinued due to hemoglobin dropping and intermittent bleeding in ostomy.   On 9/21: Pharmacy has been consulted to resume heparin drip after transfusion (Hgb 7, transfused 1 unit PRBC) On 9/27 heparin held for [facility], resumed w/ no bolus at 13:00  01/06/2023 AM Heparin level 0.75 slightly supra-therapeutic with heparin infusion at 2000 units/hr Hg 8 low but stable, PLT WNL No bleeding reported   Goal of Therapy:  Heparin level 0.3-0.7 units/ml Monitor platelets by anticoagulation protocol: Yes   Plan:  Decrease  heparin to 1900 units/hr and check 6 hr heparin level Daily CBC & Heparin level Monitor for signs/symptoms of bleeding Follow up long-term anticoagulation plans   Herby Abraham, Pharm.D Use secure chat for questions 01/06/2023 8:30 AM

## 2023-01-07 DIAGNOSIS — K90829 Short bowel syndrome, unspecified: Secondary | ICD-10-CM | POA: Diagnosis not present

## 2023-01-07 LAB — HEPARIN LEVEL (UNFRACTIONATED): Heparin Unfractionated: 0.62 [IU]/mL (ref 0.30–0.70)

## 2023-01-07 LAB — GLUCOSE, CAPILLARY
Glucose-Capillary: 111 mg/dL — ABNORMAL HIGH (ref 70–99)
Glucose-Capillary: 136 mg/dL — ABNORMAL HIGH (ref 70–99)
Glucose-Capillary: 85 mg/dL (ref 70–99)
Glucose-Capillary: 90 mg/dL (ref 70–99)
Glucose-Capillary: 91 mg/dL (ref 70–99)

## 2023-01-07 LAB — CBC
HCT: 25.8 % — ABNORMAL LOW (ref 36.0–46.0)
Hemoglobin: 7.9 g/dL — ABNORMAL LOW (ref 12.0–15.0)
MCH: 28 pg (ref 26.0–34.0)
MCHC: 30.6 g/dL (ref 30.0–36.0)
MCV: 91.5 fL (ref 80.0–100.0)
Platelets: 456 10*3/uL — ABNORMAL HIGH (ref 150–400)
RBC: 2.82 MIL/uL — ABNORMAL LOW (ref 3.87–5.11)
RDW: 18.5 % — ABNORMAL HIGH (ref 11.5–15.5)
WBC: 21.4 10*3/uL — ABNORMAL HIGH (ref 4.0–10.5)
nRBC: 0 % (ref 0.0–0.2)

## 2023-01-07 NOTE — Progress Notes (Signed)
2 Days Post-Op   Subjective/Chief Complaint: PT doing well this AM Tol some PO    Objective: Vital signs in last 24 hours: Temp:  [98.2 F (36.8 C)-99.5 F (37.5 C)] 98.8 F (37.1 C) (09/29 0422) Pulse Rate:  [112-135] 127 (09/29 0400) Resp:  [16-27] 27 (09/29 0400) BP: (100-121)/(59-75) 113/74 (09/29 0400) SpO2:  [88 %-100 %] 98 % (09/29 0400) Weight:  [57.7 kg] 57.7 kg (09/29 0418) Last BM Date : 01/04/23  Intake/Output from previous day: 09/28 0701 - 09/29 0700 In: 1581.3 [I.V.:531.2; NG/GT:450; IV Piggyback:600.1] Out: 2825 [Urine:825; Stool:2000] Intake/Output this shift: No intake/output data recorded.  General appearance: alert and cooperative GI: soft, non-tender; bowel sounds normal; no masses,  no organomegaly Inc: c/d/i  Lab Results:  Recent Labs    01/06/23 0700 01/07/23 0306  WBC 22.5* 21.4*  HGB 8.0* 7.9*  HCT 25.5* 25.8*  PLT 448* 456*   BMET No results for input(s): "NA", "K", "CL", "CO2", "GLUCOSE", "BUN", "CREATININE", "CALCIUM" in the last 72 hours. PT/INR No results for input(s): "LABPROT", "INR" in the last 72 hours. ABG No results for input(s): "PHART", "HCO3" in the last 72 hours.  Invalid input(s): "PCO2", "PO2"  Studies/Results: No results found.  Anti-infectives: Anti-infectives (From admission, onward)    Start     Dose/Rate Route Frequency Ordered Stop   12/29/22 1215  ciprofloxacin (CIPRO) IVPB 400 mg        400 mg 200 mL/hr over 60 Minutes Intravenous 2 times daily 12/29/22 1123     12/29/22 1215  metroNIDAZOLE (FLAGYL) IVPB 500 mg        500 mg 100 mL/hr over 60 Minutes Intravenous 2 times daily 12/29/22 1123     12/28/22 1430  micafungin (MYCAMINE) 100 mg in sodium chloride 0.9 % 100 mL IVPB  Status:  Discontinued        100 mg 105 mL/hr over 1 Hours Intravenous Every 24 hours 12/28/22 1335 01/05/23 0731   12/23/22 2000  vancomycin (VANCOREADY) IVPB 1250 mg/250 mL  Status:  Discontinued        1,250 mg 166.7 mL/hr  over 90 Minutes Intravenous Every 36 hours 12/22/22 0612 12/22/22 1103   12/22/22 0645  vancomycin (VANCOREADY) IVPB 1250 mg/250 mL        1,250 mg 166.7 mL/hr over 90 Minutes Intravenous  Once 12/22/22 0551 12/22/22 0805   12/22/22 0600  piperacillin-tazobactam (ZOSYN) IVPB 3.375 g  Status:  Discontinued        3.375 g 12.5 mL/hr over 240 Minutes Intravenous Every 8 hours 12/22/22 0551 12/29/22 1123   12/13/22 1000  piperacillin-tazobactam (ZOSYN) IVPB 3.375 g  Status:  Discontinued        3.375 g 12.5 mL/hr over 240 Minutes Intravenous Every 8 hours 12/13/22 9528 12/20/22 1333       Assessment/Plan: Hx Crohn's disease s/p subtotal colectomy  Internal hernia with bowel ischemia - POD 234 exploratory laparotomy with small bowel resection 9/5 Dr. Hillery Hunter - POD 21  s/p exploratory laparotomy, ileostomy, abdominal closure 9/7 Dr. 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 Highly likely to have high ostomy output  -POD 2 from PAC removal. Pre-alb 11 and Albumin < 1.5 on 9/19 - Imodium 4mg  QID, Iron 325mg  BID, Fibercon 625mg  TID. Added lomotil increased today to 1 mg QID. - DYS 3 diet, nocturnal TF stopped 9/25. Will resume nocturnal TF while off of TPN with plans to stop them if PO intake improves. -  Diet per SLP. DYS3 - poor PO intake at current, PO supplements per dietician. - WOCN following for new ostomy. Plan ostomy clinic referral at d/c - Midline wound with hx of bleeding. Resolved. Cont VAC M/W/F with white foam in base inferiorly. Healing well. - CT A/P 9/16 done. No indication for return to the OR. Low suspicion for bowel ischemia currently. Ascites without signs of infection on para. Lactate 9/25 WNL. If ongoing leukocytosis without any other source then will consider repeat CT A/P in 24-48h.  - ABx and anti-fungals per ID - PT/OT - Palliative following peripherally currently.    Ongoing fever of unclear etiology. Low  suspicion for abdominal source. CXR 9/25 ok, BCx 9/18 NGTD, peritoneal cultures 9/17 and 9/23 NGTD. Repeat BCx 9/25 pending. ID following. They request line holiday. Also ok to D/C foley from CCS standpoint.   ID - Zosyn 9/4>>9/11. 9/13 >> Micafungin 9/19 >> Patient still febrile with elevated leukocytosis.  LE [place] 9/18 neg. UE [place] 9/19 neg. UA neg. CXR w/ L > R opacities with b/l pleural effusions. Defer to [facility] if pleural effusions need further addressing. Resp Cx ordered. Bcx including specific fungal blood cx NGTD. PAC out 9/27- WBC trending down   FEN - DYS 3 per SLP as above. Cyclic TPN, nocturnal TF @ 30 mL/hr; s/p 1,000 mL NS bolus 9/25 VTE - SCDs, Hep gtt Foley - Failed TOV. Foley in place with good UOP.      - per [facility] - PE - Noted on CTA 9/10. LE [place] negative for DVT. Heparin gtt. Hgb 7.5 from 8.6, agree with PM CBC. Agree with CCM that we may need to consider IVC temporary filter at some point if unable to restart heparin gtt (hx breast ca, recent surgery, immobilized). Shock - off pressors VDRF - extubated  Recent cardiac arrest 9/3 AKI - Cr stable on most recent labs Anemia - Transfuse to keep hemoglobin above 7. Consider IV iron supplementation - she has short gut so worried she will not absorb iron well orally. Last PT-INR 9/15 was 1.3.  Hx metastatic breast cancer   LOS: 25 days    Axel Filler 01/07/2023

## 2023-01-07 NOTE — Progress Notes (Signed)
PROGRESS NOTE    [name]  MVH:[id number] DOB: December 16, 1975 DOA: 12/12/2022 PCP: Ollen Bowl, MD   Brief Narrative:  [name] is a 47 y.o. female with PMH significant for Crohn's disease with previous bowel obstruction s/p subtotal colectomy 2021, colonic stricture, IDA, breast cancer s/p mastectomy 2022 currently not on treatment. Recently hospitalized 8/10-8/15/2024 for bowel obstruction seen by GI and general surgery. Underwent EGD and flexible sigmoidoscopy and discharged on steroids.   9/3, patient presented to the ED with abdominal distention, pain, nausea, vomiting CT abd/pelvis showed markedly dilated distal small bowel consistent with chronic obstruction. No changes since prior study.  Patient was admitted to [facility] with a plan of GI consultation.   9/4, in the morning, within few hours of admission, patient was found pulseless on the floor in the bathroom in ED, estimated downtime about 5 minutes. ACLS was initiated. Concern for possible torsades de pointes and given IV mag, defibrillated.  ROSC achieved in about 5 minutes. She was intubated. She had copious vomitus in the ETT.  Notably, she had I-stat peri-code which showed hemoglobin of 5.5 down from 10 overnight.  Admitted to ICU In the ICU, patient had right femoral CVC placed.  Given 3 minutes of PRBC, 3 units of FFP.  Patient was started on vasopressors   9/5, underwent exploratory laparotomy.  Noted to have internal hernia with associated small bowel ischemia.  Underwent bowel resection. 9/7, back to OR for ostomy placement 9/8, weaned off Levophed 9/10, extubated 9/10, found to have PE in CT angio chest.  Started on heparin drip which was subsequently stopped the next day because of drop in hemoglobin 9/12, mental status improved, became more interactive.  However failed swallow eval. 9/16, transferred out to [facility] 9/16, CT abdomen showed peritoneal fluid collection 9/17, underwent paracentesis, 500 cc of  clear yellow fluid drained 9/22, CT abdomen raised concern of early developing obstruction, peritonitis.  9/23, paracentesis by IR.  Drained 90 cc of turbid, amber/blood-tinged fluid   Patient's hospitalization has been prolonged because of persistent fever, persistent high output from ostomy, anemia requiring blood transfusion, dysphagia.  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 Hillery Hunter s/p ex lap, ileostomy, abdominal closure 9/7 Dr. Hillery Hunter Currently only has 100 cm of small bowel left and hence started syndrome.  Continues to have significant ostomy output.   TPN dependent To slow down ostomy output, patient is currently on Imodium, iron, FiberCon, Imodium.  Surgery following and managing.   Dysphagia Postextubation, patient failed speech therapy  reevaluation multiple amps.  Gradually improving and currently on dysphagia 3 diet, tube feeding during the day and TPN during the night.    Persistent sepsis/fever of unknown origin Patient continued to  have persistent fever, tachycardia and leukocytosis Extensive workup done.  9/19, ID consulted.  Currently on ciprofloxacin, Flagyl and micafungin. 9/22, CT abdomen raised concern of early developing obstruction, peritonitis. 9/23, paracentesis by IR.  Drained 90 cc of turbid, amber/blood-tinged fluid, culture negative.  Seen by ID 01/03/2023, they recommend line holiday.  Central line was removed on 01/04/2023.  Patient's last temperature spike was prior to that and was 100.4 at 7 AM same day.  Patient's port was removed by general surgery on 01/05/2023 as well.  She will need central line again for TPN, per general surgery. Repeat blood culture obtained 01/03/2023 and negative so far.  Leukocytosis gradually improving.  Persistent tachycardia Tachycardia had improved but she is tachycardic again this morning and says that she just finished her meal and that is why she is tachycardic.  She is not having any symptoms.  This is likely secondary to deconditioning.Currently on Metropol 50 mg twice daily.     AKI Likely due to ATN related to septic shock.  Resolved.  Pulmonary embolism Left lower lobe segmental PE diagnosed on 9/10, initially started on heparin which was stopped after 1 day due to anemia but then resumed on 12/30/2022.   Acute on chronic anemia Hemoglobin trend as below.  Received a total of 9 units of PRBC so far. 9/21, heparin drip was resumed.  Hemoglobin fairly stable since 12/31/2022.  No active bleeding.  Continue to monitor.  Hemoglobin has remained stable around 7.5 for the last few days.  Hyperglycemia A1c 5.7 on 9//24 Continue SSI/Accu-Cheks  Loss of voice/hoarse voice Postextubation, patient was noted to have dysphagia as well as loss of voice. If no improvement, we'll involve ENT after acute issues resolve however per patient, her voice is improving but very slowly.   Recurrent breast cancer: Follows up with oncology Dr. Pamelia Hoit   DVT prophylaxis: Place and maintain  sequential compression device Start: 12/15/22 1031 SCDs Start: 12/13/22 0413   Code Status: Full Code  Family Communication:  None present at bedside.  Plan of care discussed with patient in length and he/she verbalized understanding and agreed with it.  Status is: Inpatient Remains inpatient appropriate because: Patient is still very sick with multiple problems.     Estimated body mass index is 21.83 kg/m as calculated from the following:   Height as of this encounter: 5\' 4"  (1.626 m).   Weight as of this encounter: 57.7 kg.    Nutritional Assessment: Body mass index is 21.83 kg/m.Marland Kitchen Seen by dietician.  I agree with the assessment and plan as outlined below: Nutrition Status: Nutrition Problem: Moderate Malnutrition Etiology: chronic illness (Crohn's disease; metastatic breast cancer) Signs/Symptoms: moderate fat depletion, moderate muscle depletion, percent weight loss (24% in 4.5 months) Percent weight loss: 24 % (in 4.5 months) Interventions: Tube feeding, TPN  . Skin Assessment: I have examined the patient's skin and I agree with the wound assessment as performed by the wound care RN as outlined below:    Consultants:  ID and general surgery  Procedures:  As above  Antimicrobials:  Anti-infectives (From admission, onward)    Start     Dose/Rate Route Frequency Ordered Stop   12/29/22 1215  ciprofloxacin (CIPRO) IVPB 400 mg        400 mg 200 mL/hr over 60 Minutes  Intravenous 2 times daily 12/29/22 1123     12/29/22 1215  metroNIDAZOLE (FLAGYL) IVPB 500 mg        500 mg 100 mL/hr over 60 Minutes Intravenous 2 times daily 12/29/22 1123     12/28/22 1430  micafungin (MYCAMINE) 100 mg in sodium chloride 0.9 % 100 mL IVPB  Status:  Discontinued        100 mg 105 mL/hr over 1 Hours Intravenous Every 24 hours 12/28/22 1335 01/05/23 0731   12/23/22 2000  vancomycin (VANCOREADY) IVPB 1250 mg/250 mL  Status:  Discontinued        1,250 mg 166.7 mL/hr over 90 Minutes  Intravenous Every 36 hours 12/22/22 0612 12/22/22 1103   12/22/22 0645  vancomycin (VANCOREADY) IVPB 1250 mg/250 mL        1,250 mg 166.7 mL/hr over 90 Minutes Intravenous  Once 12/22/22 0551 12/22/22 0805   12/22/22 0600  piperacillin-tazobactam (ZOSYN) IVPB 3.375 g  Status:  Discontinued        3.375 g 12.5 mL/hr over 240 Minutes Intravenous Every 8 hours 12/22/22 0551 12/29/22 1123   12/13/22 1000  piperacillin-tazobactam (ZOSYN) IVPB 3.375 g  Status:  Discontinued        3.375 g 12.5 mL/hr over 240 Minutes Intravenous Every 8 hours 12/13/22 0833 12/20/22 1333         Subjective: Seen and examined.  No complaints today.  Objective: Vitals:   01/07/23 0500 01/07/23 0600 01/07/23 0700 01/07/23 0810  BP: (!) 92/59 110/66 (!) 95/56   Pulse: (!) 125 (!) 128 (!) 122   Resp: 17 18 17    Temp:    98.6 F (37 C)  TempSrc:    Oral  SpO2: 97% 100% 98%   Weight:      Height:        Intake/Output Summary (Last 24 hours) at 01/07/2023 1021 Last data filed at 01/07/2023 0800 Gross per 24 hour  Intake 1600.27 ml  Output 2350 ml  Net -749.73 ml   Filed Weights   01/05/23 0811 01/06/23 0500 01/07/23 0418  Weight: 57.8 kg 58.4 kg 57.7 kg    Examination:  General exam: Appears calm and comfortable  Respiratory system: Clear to auscultation. Respiratory effort normal. Cardiovascular system: S1 & S2 heard, RRR. No JVD, murmurs, rubs, gallops or clicks. No pedal edema. Gastrointestinal system: Abdomen is nondistended, soft and mild generalized tenderness. No organomegaly or masses felt. Normal bowel sounds heard. Central nervous system: Alert and oriented. No focal neurological deficits. Extremities: Symmetric 5 x 5 power. Skin: No rashes, lesions or ulcers.  Psychiatry: Judgement and insight appear normal. Mood & affect appropriate.   Data Reviewed: I have personally reviewed following labs and imaging studies  CBC: Recent Labs  Lab 01/03/23 0632 01/04/23 0500 01/05/23 0506  01/06/23 0700 01/07/23 0306  WBC 20.1* 23.0* 24.3* 22.5* 21.4*  HGB 7.6* 7.7* 7.6* 8.0* 7.9*  HCT 24.3* 24.5* 23.9* 25.5* 25.8*  MCV 90.7 89.4 88.5 91.1 91.5  PLT 348 388 381 448* 456*   Basic Metabolic Panel: Recent Labs  Lab 01/01/23 0456 01/01/23 0617 01/02/23 0517 01/04/23 0500 01/04/23 0600  NA 135 133* 138  --  134*  K 3.8 3.8 4.2  --  4.2  CL 102 101 103  --  101  CO2 25 27 26   --  25  GLUCOSE 149* 121* 121*  --  129*  BUN 33* 34* 33*  --  27*  CREATININE 1.01* 0.94 0.86  --  0.64  CALCIUM 7.2* 7.2* 8.0*  --  8.0*  MG 1.9  --   --  1.8  --   PHOS 3.1  --   --  3.5  --    GFR: Estimated Creatinine Clearance: 76.7 mL/min (by C-G formula based on SCr of 0.64 mg/dL). Liver Function Tests: Recent Labs  Lab 01/01/23 0617 01/04/23 0600  AST 32 44*  ALT 26 30  ALKPHOS 148* 196*  BILITOT 0.4 0.7  PROT 5.6* 6.2*  ALBUMIN <1.5* 1.5*   No results for input(s): "LIPASE", "AMYLASE" in the last 168 hours. No results for input(s): "AMMONIA" in the last 168 hours. Coagulation Profile: No results for input(s): "INR", "PROTIME" in the last 168 hours.  Cardiac Enzymes: No results for input(s): "CKTOTAL", "CKMB", "CKMBINDEX", "TROPONINI" in the last 168 hours. BNP (last 3 results) No results for input(s): "PROBNP" in the last 8760 hours. HbA1C: No results for input(s): "HGBA1C" in the last 72 hours. CBG: Recent Labs  Lab 01/06/23 0801 01/06/23 1125 01/06/23 1942 01/07/23 0041 01/07/23 0746  GLUCAP 96 125* 97 111* 91   Lipid Profile: No results for input(s): "CHOL", "HDL", "LDLCALC", "TRIG", "CHOLHDL", "LDLDIRECT" in the last 72 hours.  Thyroid Function Tests: No results for input(s): "TSH", "T4TOTAL", "FREET4", "T3FREE", "THYROIDAB" in the last 72 hours. Anemia Panel: No results for input(s): "VITAMINB12", "FOLATE", "FERRITIN", "TIBC", "IRON", "RETICCTPCT" in the last 72 hours. Sepsis Labs: Recent Labs  Lab 01/03/23 1257 01/04/23 0924  PROCALCITON  --   0.43  LATICACIDVEN 1.2  --     Recent Results (from the past 240 hour(s))  Culture, fungus without smear     Status: None (Preliminary result)   Collection Time: 01/01/23 11:52 AM   Specimen: Peritoneal Washings; Other  Result Value Ref Range Status   Specimen Description   Final    PERITONEAL Performed at [facility], [address], [place], [place] [address]    Special Requests   Final    NONE Performed at [facility], [address], [place], [place] [address]    Culture   Final    NO FUNGUS ISOLATED AFTER 5 DAYS Performed at [facility], [address] [place], [place] [address]    Report Status PENDING  Incomplete  Acid Fast Smear (AFB)     Status: None   Collection Time: 01/01/23 11:52 AM   Specimen: Peritoneal  Result Value Ref Range Status   AFB Specimen Processing Concentration  Final   Acid Fast Smear Negative  Final    Comment: (NOTE) Performed At: [facility] [address] [place], [place] [id number] Jolene Schimke MD [id number]    Source (AFB) NONE  Final    Comment: Performed at [facility], [address], [place], [place] [address]  Aerobic/Anaerobic Culture w Gram Stain (surgical/deep wound)     Status: None   Collection Time: 01/01/23 11:52 AM   Specimen: Peritoneal Washings  Result Value Ref Range Status   Specimen Description   Final    PERITONEAL Performed at [facility], [address] [place] [address] [place], [place] [address]    Special Requests   Final    NONE Performed at [facility], [address] [place], [place] [address]    Gram Stain   Final    ABUNDANT WBC PRESENT, PREDOMINANTLY MONONUCLEAR NO ORGANISMS SEEN    Culture   Final    No growth aerobically or anaerobically. Performed at [facility],  [address], [place], [place] [address]    Report Status 01/06/2023 FINAL  Final  Culture, blood (Routine X 2) w Reflex  to ID Panel     Status: None (Preliminary result)   Collection Time: 01/03/23  5:32 PM   Specimen: BLOOD  Result Value Ref Range Status   Specimen Description   Final    BLOOD PICC LINE Performed at [facility], [address], [place], [place] [address]    Special Requests   Final    BOTTLES DRAWN AEROBIC AND ANAEROBIC Blood Culture adequate volume Performed at [facility], [address], [place], [place] [address]    Culture   Final    NO GROWTH 4 DAYS Performed at [facility], [address] [place], [place] [address]    Report Status PENDING  Incomplete  Culture, blood (Routine X 2) w Reflex to ID Panel     Status: None (Preliminary result)   Collection Time: 01/03/23  5:32 PM   Specimen: BLOOD LEFT FOREARM  Result Value Ref Range Status   Specimen Description   Final    BLOOD LEFT FOREARM Performed at [facility], [address] [place], [place] [address]    Special Requests   Final    BOTTLES DRAWN AEROBIC AND ANAEROBIC Blood Culture adequate volume Performed at [facility], [address], [place], [place] [address]    Culture   Final    NO GROWTH 4 DAYS Performed at [facility], [address] [place], [place] [address]    Report Status PENDING  Incomplete  Culture, blood (Routine X 2) w Reflex to ID Panel     Status: None (Preliminary result)   Collection Time: 01/04/23 11:40 PM   Specimen: BLOOD LEFT HAND  Result Value Ref Range Status   Specimen Description   Final    BLOOD LEFT HAND Performed at [facility], [address] [place], [place] [address]    Special Requests   Final    BOTTLES DRAWN AEROBIC ONLY Blood Culture adequate volume Performed at [facility], [address] [place], [place] [address]    Culture   Final    NO GROWTH 2 DAYS Performed at [facility], [address] [place], [place] [address]    Report Status PENDING   Incomplete  Culture, blood (Routine X 2) w Reflex to ID Panel     Status: None (Preliminary result)   Collection Time: 01/04/23 11:40 PM   Specimen: BLOOD LEFT ARM  Result Value Ref Range Status   Specimen Description   Final    BLOOD LEFT ARM Performed at [facility], [address] [place], [place] [address]    Special Requests   Final    BOTTLES DRAWN AEROBIC ONLY Blood Culture results may not be optimal due to an inadequate volume of blood received in culture bottles Performed at [facility], [address] [place], [place] [address]    Culture   Final    NO GROWTH 2 DAYS Performed at [facility], [address], [place], [place] [address]    Report Status PENDING  Incomplete     Radiology Studies: No results found.  Scheduled Meds:  ascorbic acid  500 mg Per Tube BID   cholecalciferol  400 Units Per Tube Daily   diphenoxylate-atropine  1 tablet Per Tube QID   famotidine  20 mg Per Tube Daily   feeding supplement (KATE FARMS STANDARD 1.4)  325 mL Oral BID BM   feeding supplement (KATE FARMS STANDARD 1.4)  360 mL Per Tube Q24H   ferrous sulfate  300 mg Per Tube BID   fiber  1 packet Per Tube TID   gabapentin  200 mg Per Tube Q8H   insulin aspart  0-9 Units Subcutaneous 4 times per day   loperamide HCl  4 mg Per Tube QID   metoprolol tartrate  50 mg Per Tube BID   mouth rinse  15 mL Mouth Rinse 4 times per day   sodium chloride flush  10-40 mL Intracatheter Q12H   Vitamin D (Ergocalciferol)  50,000 Units Oral Q7 days   vitamin E  100 Units Per Tube Daily   zinc sulfate  220 mg Per Tube Daily   Continuous Infusions:  ciprofloxacin Stopped (01/06/23 2330)   heparin 1,900 Units/hr (01/07/23 0800)   metronidazole Stopped (01/06/23 2325)   phytonadione (VITAMIN K) 2 mg in dextrose 5 % 50 mL IVPB Stopped (01/04/23 1559)     LOS: 25 days   Hughie Closs, MD [facility]  01/07/2023, 10:21 AM   *Please note that this is a  verbal dictation therefore any spelling or grammatical errors are due to the "Dragon Medical One" system interpretation.  Please page via Amion and do not message via secure chat for urgent patient care matters. Secure chat can be used for non urgent patient care matters.  How to contact the [facility] Attending or Consulting provider 7A - 7P or covering provider during after hours 7P -7A, for this patient?  Check the care team in [facility] and look for a) attending/consulting [facility] provider listed and b) the [facility] team listed. Page or secure chat 7A-7P. Log into [email or web address] and use [facility] universal password to access. If you do not have the password, please contact the hospital operator. Locate the [facility] provider you are looking for under [facility] and page to a number that you can be directly reached. If you still have difficulty reaching the provider, please page the [facility] (Director on Call) for the Hospitalists listed on amion for assistance.

## 2023-01-07 NOTE — Progress Notes (Signed)
ANTICOAGULATION CONSULT NOTE  Pharmacy Consult for heparin Indication: pulmonary embolus  Allergies  Allergen Reactions   Nsaids Other (See Comments)    Crohns disease/ IBD   Chlorhexidine Gluconate Itching    Able to use CHG swabs for ports, reaction with direct skin contact   Hydromorphone Other (See Comments)    Confusion/sedation   Lactose Intolerance (Gi) Other (See Comments)    Upset stomach/diarrhea    Patient Measurements: Height: 5\' 4"  (162.6 cm) Weight: 57.7 kg (127 lb 3.3 oz) IBW/kg (Calculated) : 54.7 Heparin Dosing Weight: TBW  Vital Signs: Temp: 98.6 F (37 C) (09/29 0810) Temp Source: Oral (09/29 0810) BP: 95/56 (09/29 0700) Pulse Rate: 122 (09/29 0700)  Labs: Recent Labs    01/05/23 0506 01/06/23 0700 01/06/23 1641 01/07/23 0306  HGB 7.6* 8.0*  --  7.9*  HCT 23.9* 25.5*  --  25.8*  PLT 381 448*  --  456*  HEPARINUNFRC  --  0.75* 0.63 0.62    Estimated Creatinine Clearance: 76.7 mL/min (by C-G formula based on SCr of 0.64 mg/dL).   Medications:  No AC PTA.  Assessment: 47 yo F with a PMH of recurrent breast cancer not currently on treatment, who presented to the ED on 12/12/2022 with abdominal pain, nausea, vomiting diarrhea.   Patient was placed on heparin drip from 9/10-9/12 for new LLL PE. On 9/12 heparin drip was discontinued due to hemoglobin dropping and intermittent bleeding in ostomy.   On 9/21: Pharmacy has been consulted to resume heparin drip after transfusion (Hgb 7, transfused 1 unit PRBC) On 9/27 heparin held for [facility], resumed w/ no bolus at 13:00  01/07/2023 Heparin level 0.62: remains therapeutic with heparin infusion at 1900 units/hr Hg 7.9 low but stable, PLT 456 No bleeding reported   Goal of Therapy:  Heparin level 0.3-0.7 units/ml Monitor platelets by anticoagulation protocol: Yes   Plan:  Continue heparin @1900  units/hr   Daily CBC & Heparin level Monitor for signs/symptoms of bleeding Follow up long-term  anticoagulation plans    Herby Abraham, Pharm.D Use secure chat for questions 01/07/2023 9:09 AM

## 2023-01-08 ENCOUNTER — Other Ambulatory Visit: Payer: Self-pay

## 2023-01-08 DIAGNOSIS — K90829 Short bowel syndrome, unspecified: Secondary | ICD-10-CM | POA: Diagnosis not present

## 2023-01-08 LAB — COMPREHENSIVE METABOLIC PANEL
ALT: 32 U/L (ref 0–44)
AST: 52 U/L — ABNORMAL HIGH (ref 15–41)
Albumin: 1.8 g/dL — ABNORMAL LOW (ref 3.5–5.0)
Alkaline Phosphatase: 156 U/L — ABNORMAL HIGH (ref 38–126)
Anion gap: 9 (ref 5–15)
BUN: 5 mg/dL — ABNORMAL LOW (ref 6–20)
CO2: 26 mmol/L (ref 22–32)
Calcium: 8 mg/dL — ABNORMAL LOW (ref 8.9–10.3)
Chloride: 102 mmol/L (ref 98–111)
Creatinine, Ser: 0.86 mg/dL (ref 0.44–1.00)
GFR, Estimated: >60 mL/min (ref >60)
Glucose, Bld: 94 mg/dL (ref 70–99)
Potassium: 3.3 mmol/L — ABNORMAL LOW (ref 3.5–5.1)
Sodium: 137 mmol/L (ref 135–145)
Total Bilirubin: 0.5 mg/dL (ref 0.3–1.2)
Total Protein: 6.5 g/dL (ref 6.5–8.1)

## 2023-01-08 LAB — CULTURE, BLOOD (ROUTINE X 2)
Culture: NO GROWTH
Culture: NO GROWTH

## 2023-01-08 LAB — GLUCOSE, CAPILLARY
Glucose-Capillary: 83 mg/dL (ref 70–99)
Glucose-Capillary: 150 mg/dL — ABNORMAL HIGH (ref 70–99)
Glucose-Capillary: 78 mg/dL (ref 70–99)
Glucose-Capillary: 93 mg/dL (ref 70–99)

## 2023-01-08 LAB — CBC
HCT: 25.1 % — ABNORMAL LOW (ref 36.0–46.0)
Hemoglobin: 7.8 g/dL — ABNORMAL LOW (ref 12.0–15.0)
MCH: 28.1 pg (ref 26.0–34.0)
MCHC: 31.1 g/dL (ref 30.0–36.0)
MCV: 90.3 fL (ref 80.0–100.0)
Platelets: 464 10*3/uL — ABNORMAL HIGH (ref 150–400)
RBC: 2.78 MIL/uL — ABNORMAL LOW (ref 3.87–5.11)
RDW: 18.2 % — ABNORMAL HIGH (ref 11.5–15.5)
WBC: 21.6 10*3/uL — ABNORMAL HIGH (ref 4.0–10.5)
nRBC: 0 % (ref 0.0–0.2)

## 2023-01-08 LAB — PHOSPHORUS: Phosphorus: 3.6 mg/dL (ref 2.5–4.6)

## 2023-01-08 LAB — HEPARIN LEVEL (UNFRACTIONATED)
Heparin Unfractionated: 0.76 [IU]/mL — ABNORMAL HIGH (ref 0.30–0.70)
Heparin Unfractionated: 0.63 [IU]/mL (ref 0.30–0.70)

## 2023-01-08 LAB — MAGNESIUM: Magnesium: 1.5 mg/dL — ABNORMAL LOW (ref 1.7–2.4)

## 2023-01-08 LAB — TRIGLYCERIDES: Triglycerides: 145 mg/dL (ref <150)

## 2023-01-08 MED ORDER — POTASSIUM CHLORIDE 20 MEQ PO PACK
40.0000 meq | PACK | ORAL | Status: AC
  Administered 2023-01-08 (×2): 40 meq via ORAL
  Filled 2023-01-08 (×2): qty 2

## 2023-01-08 MED ORDER — TRAVASOL 10 % IV SOLN
INTRAVENOUS | Status: AC
  Filled 2023-01-08: qty 940.8

## 2023-01-08 MED ORDER — MAGNESIUM SULFATE 2 GM/50ML IV SOLN
2.0000 g | Freq: Once | INTRAVENOUS | Status: AC
  Administered 2023-01-08: 2 g via INTRAVENOUS
  Filled 2023-01-08: qty 50

## 2023-01-08 MED ORDER — DIPHENOXYLATE-ATROPINE 2.5-0.025 MG PO TABS
2.0000 | ORAL_TABLET | Freq: Four times a day (QID) | ORAL | Status: DC
  Administered 2023-01-08 – 2023-01-09 (×7): 2
  Filled 2023-01-08 (×7): qty 2

## 2023-01-08 MED ORDER — ALPRAZOLAM 0.25 MG PO TABS
0.2500 mg | ORAL_TABLET | Freq: Two times a day (BID) | ORAL | Status: DC
  Administered 2023-01-08 – 2023-01-16 (×16): 0.25 mg via ORAL
  Filled 2023-01-08 (×17): qty 1

## 2023-01-08 MED ORDER — POTASSIUM CHLORIDE CRYS ER 20 MEQ PO TBCR: 40.0000 meq | EXTENDED_RELEASE_TABLET | ORAL | Status: DC

## 2023-01-08 NOTE — Plan of Care (Signed)
  Problem: Education: Goal: Knowledge of General Education information will improve Description: Including pain rating scale, medication(s)/side effects and non-pharmacologic comfort measures Outcome: Progressing   Problem: Pain Managment: Goal: General experience of comfort will improve Outcome: Progressing   

## 2023-01-08 NOTE — Progress Notes (Signed)
Nutrition Follow-up  DOCUMENTATION CODES:   Non-severe (moderate) malnutrition in context of chronic illness  INTERVENTION:  -  TPN to restart this evening one PICC placed. Plan to resume x12 hour cyclic feeds to meet 100% of needs.   - TPN management per pharmacy.   - Per Surgery, plan to continue TF at 22mL/hr. Plan for nocturnal feeds x12 hours overnight. - [employer] 1.4 at 59mL/hr (1800-0600)   - Continue DYS 3 with nectar thickened liquids per SLP recs. [employer] 1.4 orally BID, each supplement provides 455 kcal and 20 grams protein.    - Per Surgery, fat soluble vitamin supplementation was started for vitamins D, E, and K. Checking levels to assess need for maintenance doses or repletion doses of supplementation.             - Vitamin D: LOW, continue 50,000 units Q7 days x2 months.             - Vitamin E: WNL, discontinue supplementation             - Vitamin K: HIGH, discontinue supplementation             - Vitamin A: WNL   - Continue MVI in TPN.   - Continue 500mg  vitamin C BID and 220mg  zinc x14 days (ends 10/4) to promote wound healing.    - Daily weights while on TPN    Patient will need to be TPN dependent long term.   NUTRITION DIAGNOSIS:   Moderate Malnutrition related to chronic illness (Crohn's disease; metastatic breast cancer) as evidenced by moderate fat depletion, moderate muscle depletion, percent weight loss (24% in 4.5 months). *ongoing  GOAL:   Patient will meet greater than or equal to 90% of their needs *progressing, restarting TPN  MONITOR:   Labs, Weight trends, TF tolerance, I & O's, Diet advancement (TPN)  REASON FOR ASSESSMENT:   Consult Enteral/tube feeding initiation and management  ASSESSMENT:   47 y.o. female with PMH of recurrent breast cancer not currently on treatment, Crohn's disease s/p subtotal colectomy in 2021, recurrent obstructions, colonic stricture, recent hospitalization 8/10-8/15/2024 for obstruction who  presented on 12/12/2022 with abdominal pain, nausea, vomiting diarrhea.  9/4 Admit; found down in ED bathroom with no pulse, intubated 9/5 s/p ex-lap, found to have internal hernia with small bowel ischemia, only ~100cm of small bowel remaining 9/7 ex-lap, I&D, end ileostomy 9/8 TPN started at 56mL/hr 9/9 TPN increased to 91mL/hr 9/10 extubated, NGT removed but then replaced. 9/12: TF stopped 9/14: small bore NGT placed for medications, MBS performed 9/18: trickle TF (34mL/hr) of [employer] 1.2 started, starting fat soluble vitamin supplementation 9/19: Transitioned to 12 hour nocturnal TF 9/20: MBS -> diet advanced to DYS 1 with nectar thick liquids 9/24: diet advanced to DYS 3 with nectar thick liquids; nocturnal TF increased to 39mL/hr 9/25: Nocturnal TF discontinued 9/26: TPN d/c for line holiday; nocturnal TF restarted at 33mL/hr  Patient reports diet has been going fair. She doesn't like the food which has been hindering her intake. Appetite also not the best. There are no meal intakes documented to fully assess intake. She has been drinking a little of the oral [employer]. Per MAR, accepting around 60-34mL twice daily.   She has remained off TPN since 9/26 for line holiday.  TF restarted the same evening and patient endorses tolerating fairly well.   Per discussion with Surgery, plan to place new PICC and restart TPN this evening. Surgery wanting patient to remain  on nocturnal TF at this time.   Pharmacy ordered for patient to restart cyclic feeds at goal rate this evening.   As patient has only 100cm bowel left, she will need to be TPN dependent long term. 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    Admit weight: 125# Current weight: 124# I&O's: -12.8L  Medications reviewed and include: 500mg  vitamin C BID, 220mg  zinc x14 days (ends 10/4), 300mg  ferrous sulfate, 50,000 units vitamin D Q7 days x2 months, 100 units  vitamin E, 2mg  vitamin K IV weekly, Lomotil, Imodium, Nutrisource Fiber TID  Labs reviewed:  HA1C 5.7 Blood Glucose 83-136 x24 hours Triglycerides 145 (as of 9/30) Vitamin D: 24.57 (LOW) Vitamin A: 26.9 (WNL) Vitamin E: 16.9 (WNL) Vitamin K: 4.41 (HIGH)  Diet Order:   Diet Order             DIET DYS 3 Fluid consistency: Nectar Thick  Diet effective now                   EDUCATION NEEDS:  Education needs have been addressed  Skin:  Skin Assessment: Reviewed RN Assessment Skin Integrity Issues:: Incisions Incisions: Abdomen  Last BM:  9/30 - ileostomy  Height:  Ht Readings from Last 1 Encounters:  01/05/23 5\' 4"  (1.626 m)   Weight:  Wt Readings from Last 1 Encounters:  01/08/23 56.5 kg    BMI:  Body mass index is 21.38 kg/m.  Estimated Nutritional Needs:  Kcal:  1600-1800 Protein:  85-95 grams Fluid:  1.6L/day    Shelle Iron RD, LDN For contact information, refer to [facility].

## 2023-01-08 NOTE — Progress Notes (Signed)
Spoke with Apolonio Schneiders, RN caring for this pt. Informed RN this pt has an abandoned PAC on the right side with the catheter left in the right chest. Furthermore, pt has undergone a bilateral mastectomy with lymph node removal on the right side. Made recommendations for IR to place central access due to findings. MD made aware.

## 2023-01-08 NOTE — Progress Notes (Signed)
PHARMACY - TOTAL PARENTERAL NUTRITION CONSULT NOTE   Indication: Short bowel syndrome  Patient Measurements: Height: 5\' 4"  (162.6 cm) Weight: 56.5 kg (124 lb 9 oz) IBW/kg (Calculated) : 54.7 TPN AdjBW (KG): 57.8 Body mass index is 21.38 kg/m. Usual Weight: 46.3 kg from 8/28  Assessment: 47 y.o. female with PMH of recurrent breast cancer not currently on treatment, Crohn's disease s/p subtotal colectomy in 2021, recurrent obstructions, colonic stricture, recent hospitalization 8/10-8/15/2024 for obstruction who presented on 12/12/2022 with abdominal pain, nausea, vomiting diarrhea.    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 9/26 Line holiday for persistent fevers, elevated WBC 9/30 Ok to place new PICC  Glucose / Insulin: no Hx DM -Steroids PTA for Crohn's disease; weaned off as of 9/13 -CBGs 83-136 while TPN off, nocturnal TF -SSI: 1 unit given in past 24hrs Electrolytes: K 3.3, Mg 1.5; corrCa WNL Renal: WNL Hepatic: LFTs mostly WNL. Albumin low, TG ok I/O: 24 hrs -UOP 675 mL documented -Ileostomy 1525 mL -Emesis less frequent GI Imaging:  -CTA 9/10 shows new PE, but also abdominal ascites -CT Abd: Large volume ascites GI Surgeries / Procedures:  -OR 9/5 ex lap, internal hernia w/ small bowel ischemia, SBR & celiotomy; > 105 cm small bowel remaining -OR 9/7 for closure, ostomy creation -9/17: Paracentesis -9/23 paracentesis   Central access: new PICC pending Triple lumen CVC R subclavian 9/6, PAC 05/02/22 (REMOVED) TPN start date: 12/17/22 Held 9/26-9/30 for line holiday  Nutritional Goals: TPN provides 94 g protein and 1680 kcal per day  RD Assessment: Estimated  Needs Total Energy Estimated Needs: 1600-1800 Total Protein Estimated Needs: 85-95 grams Total Fluid Estimated Needs: 1.6L/day  Current Nutrition:  Cyclic TPN (18:00-06:00) Diet: DYS 3 with nectar thickened liquids [employer] 1.4 orally BID, each supplement provides 455 kcal and 20 grams protein   Plan: -Mg 2 g IV x 1 + KCl 40 mEq x 2 -Resume cyclic TPN over 12 hr (18:00- 06:00) tonight assuming PICC placed today -Electrolytes in TPN: Na 50 mEq/L, K 50 mEq/L, Ca 5 mEq/L, Mg 5 mEq/L, Phos 15 mmol/L. Cl:Ac ratio 1:1 -Add standard MVI and trace elements to TPN -Replacing vit D, E and K via tube; vit A WNL -Continue CBG checks 4x daily while on cyclic TPN (1-2 hr after completing ramp-up, on TPN, 1 hr after TPN stopped, off TPN) -Monitor TPN labs on Mon/Thurs, will check labs tomorrow morning to ensure electrolytes stable after resuming TPN   Pricilla Riffle, PharmD, BCPS Clinical Pharmacist 01/08/2023 11:07 AM

## 2023-01-08 NOTE — Consult Note (Addendum)
WOC Nurse wound follow up Wound type: full thickness surgical post-op wound to midline abd; surgical PA at the bedside to assess wound appearance. Vac can be changed Q Mon and Thurs after today, according to their team. 13X4X.3cm, beefy red, deeper at the lower wound edges.  Small amt pink drainage. Pt was medicated for pain prior to the procedure and tolerated with minimal amt discomfort.  Removed one piece white foam and one piece black foam, and reapplied only one black foam piece. Cont suction at cont suction.  WOC team will change again on Thurs.     WOC Nurse ostomy follow up Stoma type/location: LLQ ileostomy  Stomal assessment/size: 1 1/4" well budded, pink moist round.  Wound is in close proximity and the pouch will need to be changed with each Vac change.   Output: mod amt liquid green effluent in high output pouch to bedside drainage bag.  Applied barrier ring and 2 piece pouch.  Pt watched the procedure and asked appropriate questions.   Extra supplies in the room for staff nurses use.  Use supplies: skin barrier [place] [phone]), 2 1/4" high output pouch [place] [phone]) and 2" barrier ring [place] [phone])  WOC team will continue teaching sessions twice weekly with pouch and dressing changes.   Enrolled patient in [facility] Start Discharge program: Yes previously. Thank-you,  Cammie Mcgee MSN, RN, CWOCN, [place], CNS [phone]

## 2023-01-08 NOTE — Plan of Care (Signed)
  Problem: Coping: Goal: Ability to adjust to condition or change in health will improve Outcome: Progressing   Problem: Fluid Volume: Goal: Ability to maintain a balanced intake and output will improve Outcome: Progressing   Problem: Tissue Perfusion: Goal: Adequacy of tissue perfusion will improve Outcome: Progressing   Problem: Skin Integrity: Goal: Risk for impaired skin integrity will decrease Outcome: Not Progressing   Problem: Metabolic: Goal: Ability to maintain appropriate glucose levels will improve Outcome: Adequate for Discharge

## 2023-01-08 NOTE — Progress Notes (Addendum)
3 Days Post-Op   Subjective/Chief Complaint: States she overall feels better Reports one episode of emesis yesterday morning, small volume, which she attributes to her cortrak/something in her throat.  Denies abd pain or nausea. Tolerating    Objective: Vital signs in last 24 hours: Temp:  [98.6 F (37 C)-100.1 F (37.8 C)] 100.1 F (37.8 C) (09/30 0710) Pulse Rate:  [110-134] 115 (09/30 0800) Resp:  [15-24] 19 (09/30 0800) BP: (90-122)/(59-84) 106/68 (09/30 0800) SpO2:  [95 %-100 %] 99 % (09/30 0800) Weight:  [56.5 kg] 56.5 kg (09/30 0500) Last BM Date : 01/08/23  Intake/Output from previous day: 09/29 0701 - 09/30 0700 In: 948.6 [I.V.:348.6; IV Piggyback:600.1] Out: 2375 [Urine:1350; Stool:1025] Intake/Output this shift: Total I/O In: 179.1 [I.V.:129.1; NG/GT:50] Out: -   General appearance: alert and cooperative GI: soft, non-tender; bowel sounds normal; no masses,  no organomegaly Midline incision seen with WOC RN. Granulating.   Lab Results:  Recent Labs    01/07/23 0306 01/08/23 0541  WBC 21.4* 21.6*  HGB 7.9* 7.8*  HCT 25.8* 25.1*  PLT 456* 464*   BMET Recent Labs    01/08/23 0541  NA 137  K 3.3*  CL 102  CO2 26  GLUCOSE 94  BUN 5*  CREATININE 0.86  CALCIUM 8.0*   PT/INR No results for input(s): "LABPROT", "INR" in the last 72 hours. ABG No results for input(s): "PHART", "HCO3" in the last 72 hours.  Invalid input(s): "PCO2", "PO2"  Studies/Results: No results found.  Anti-infectives: Anti-infectives (From admission, onward)    Start     Dose/Rate Route Frequency Ordered Stop   12/29/22 1215  ciprofloxacin (CIPRO) IVPB 400 mg        400 mg 200 mL/hr over 60 Minutes Intravenous 2 times daily 12/29/22 1123     12/29/22 1215  metroNIDAZOLE (FLAGYL) IVPB 500 mg        500 mg 100 mL/hr over 60 Minutes Intravenous 2 times daily 12/29/22 1123     12/28/22 1430  micafungin (MYCAMINE) 100 mg in sodium chloride 0.9 % 100 mL IVPB  Status:   Discontinued        100 mg 105 mL/hr over 1 Hours Intravenous Every 24 hours 12/28/22 1335 01/05/23 0731   12/23/22 2000  vancomycin (VANCOREADY) IVPB 1250 mg/250 mL  Status:  Discontinued        1,250 mg 166.7 mL/hr over 90 Minutes Intravenous Every 36 hours 12/22/22 0612 12/22/22 1103   12/22/22 0645  vancomycin (VANCOREADY) IVPB 1250 mg/250 mL        1,250 mg 166.7 mL/hr over 90 Minutes Intravenous  Once 12/22/22 0551 12/22/22 0805   12/22/22 0600  piperacillin-tazobactam (ZOSYN) IVPB 3.375 g  Status:  Discontinued        3.375 g 12.5 mL/hr over 240 Minutes Intravenous Every 8 hours 12/22/22 0551 12/29/22 1123   12/13/22 1000  piperacillin-tazobactam (ZOSYN) IVPB 3.375 g  Status:  Discontinued        3.375 g 12.5 mL/hr over 240 Minutes Intravenous Every 8 hours 12/13/22 1610 12/20/22 1333       Assessment/Plan: Hx Crohn's disease s/p subtotal colectomy  Internal hernia with bowel ischemia - POD 25 exploratory laparotomy with small bowel resection 9/5 Dr. Hillery Hunter - POD 23  s/p exploratory laparotomy, ileostomy, abdominal closure 9/7 Dr. 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 Highly likely to have high ostomy output  -POD 3 from PAC removal. Pre-alb 11 and  Albumin < 1.5 on 9/19 - Imodium 4mg  QID, Iron 325mg  BID, Fibercon 625mg  TID.  lomotil 1 mg QID >> increase to 2 mg TID today. - DYS 3 diet, nocturnal TF stopped 9/25. Resumed 9/27-current due to line holiday. Continue nocturnal tube feeding and continue DYS 3 diet. Resume TPN today if ID ok with PICC line. - Diet per SLP. DYS3 - poor PO intake at current, PO supplements per dietician. - WOCN following for new ostomy. Plan ostomy clinic referral at d/c - Midline wound with hx of bleeding. Resolved. Cont VAC M/W/F with white foam in base inferiorly. Healing well. - CT A/P 9/16 done. No indication for return to the OR. Low suspicion for bowel ischemia currently. Ascites  without signs of infection on para. Lactate 9/25 WNL. Central line removed 9/26. Port removed 9/27. WBC down trending. Fevers resolved.  - ABx and anti-fungals per ID - PT/OT - Palliative following peripherally currently.    Ongoing fever of unclear etiology. Low suspicion for abdominal source. CXR 9/25 ok, BCx 9/18 NGTD, peritoneal cultures 9/17 and 9/23 NGTD. Repeat BCx 9/25 and 9/26 NGTD   ID - Zosyn 9/4>>9/11. 9/13 >> Micafungin 9/19 >> Patient still febrile with elevated leukocytosis.  LE [place] 9/18 neg. UE [place] 9/19 neg. UA neg. CXR w/ L > R opacities with b/l pleural effusions. Defer to [facility] if pleural effusions need further addressing. Resp Cx ordered. Bcx including specific fungal blood cx NGTD. PAC out 9/27- WBC trending down   FEN - DYS 3 per SLP as above. D/C tube feeds, resume cyclic TPN VTE - SCDs, Hep gtt Foley - Failed TOV. Foley in place with good UOP.      - per [facility] - PE - Noted on CTA 9/10. LE [place] negative for DVT. Heparin gtt. Hgb 7.5 from 8.6, agree with PM CBC. Agree with CCM that we may need to consider IVC temporary filter at some point if unable to restart heparin gtt (hx breast ca, recent surgery, immobilized). Shock - off pressors VDRF - extubated  Recent cardiac arrest 9/3 AKI - Cr stable on most recent labs Anemia - Transfuse to keep hemoglobin above 7. Consider IV iron supplementation - she has short gut so worried she will not absorb iron well orally. Last PT-INR 9/15 was 1.3.  Hx metastatic breast cancer   LOS: 26 days    Adam Phenix 01/08/2023

## 2023-01-08 NOTE — Progress Notes (Signed)
PT Cancellation Note  Patient Details Name: [name] MRN: [id number] DOB: 06-Sep-1975   Cancelled Treatment:     Pt was NOT up to Physical Therapy this afternoon due to fatigue and "a lot going on today" with VAC change, Ostomy change and a bath.  Will attempt to see tomorrow morning.  Pt agreed.   Felecia Shelling  PTA Acute  Rehabilitation Services Office M-F          [phone]

## 2023-01-08 NOTE — Progress Notes (Signed)
PROGRESS NOTE    [name]  JOA:[id number] DOB: 1975-04-24 DOA: 12/12/2022 PCP: Ollen Bowl, MD   Brief Narrative:  [name] is a 47 y.o. female with PMH significant for Crohn's disease with previous bowel obstruction s/p subtotal colectomy 2021, colonic stricture, IDA, breast cancer s/p mastectomy 2022 currently not on treatment. Recently hospitalized 8/10-8/15/2024 for bowel obstruction seen by GI and general surgery. Underwent EGD and flexible sigmoidoscopy and discharged on steroids.   9/3, patient presented to the ED with abdominal distention, pain, nausea, vomiting CT abd/pelvis showed markedly dilated distal small bowel consistent with chronic obstruction. No changes since prior study.  Patient was admitted to [facility] with a plan of GI consultation.   9/4, in the morning, within few hours of admission, patient was found pulseless on the floor in the bathroom in ED, estimated downtime about 5 minutes. ACLS was initiated. Concern for possible torsades de pointes and given IV mag, defibrillated.  ROSC achieved in about 5 minutes. She was intubated. She had copious vomitus in the ETT.  Notably, she had I-stat peri-code which showed hemoglobin of 5.5 down from 10 overnight.  Admitted to ICU In the ICU, patient had right femoral CVC placed.  Given 3 minutes of PRBC, 3 units of FFP.  Patient was started on vasopressors   9/5, underwent exploratory laparotomy.  Noted to have internal hernia with associated small bowel ischemia.  Underwent bowel resection. 9/7, back to OR for ostomy placement 9/8, weaned off Levophed 9/10, extubated 9/10, found to have PE in CT angio chest.  Started on heparin drip which was subsequently stopped the next day because of drop in hemoglobin 9/12, mental status improved, became more interactive.  However failed swallow eval. 9/16, transferred out to [facility] 9/16, CT abdomen showed peritoneal fluid collection 9/17, underwent paracentesis, 500 cc of  clear yellow fluid drained 9/22, CT abdomen raised concern of early developing obstruction, peritonitis.  9/23, paracentesis by IR.  Drained 90 cc of turbid, amber/blood-tinged fluid   Patient's hospitalization has been prolonged because of persistent fever, persistent high output from ostomy, anemia requiring blood transfusion, dysphagia.  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 Hillery Hunter s/p ex lap, ileostomy, abdominal closure 9/7 Dr. Hillery Hunter Currently only has 100 cm of small bowel left and hence started syndrome.  Continues to have significant ostomy output.   TPN dependent To slow down ostomy output, patient is currently on Imodium, iron, FiberCon, Imodium.  Surgery following and managing.   Dysphagia Postextubation, patient failed speech therapy  reevaluation multiple amps.  Gradually improving and currently on dysphagia 3 diet, tube feeding during the day and TPN during the night.    Persistent sepsis/fever of unknown origin Patient continued to  have persistent fever, tachycardia and leukocytosis Extensive workup done.  9/19, ID consulted.  Currently on ciprofloxacin, Flagyl and micafungin. 9/22, CT abdomen raised concern of early developing obstruction, peritonitis. 9/23, paracentesis by IR.  Drained 90 cc of turbid, amber/blood-tinged fluid, culture negative.  Seen by ID 01/03/2023, they recommend line holiday.  Central line was removed on 01/04/2023.  Patient's last temperature spike was prior to that and was 100.4 at 7 AM same day.  Patient's port was removed by general surgery on 01/05/2023 as well.  She will need central line again for TPN, per general surgery.  Timing of central line per neurosurgery and ID.  Repeat blood culture obtained 01/03/2023 and negative so far.  Leukocytosis stable from yesterday.  Persistent tachycardia Tachycardia had improved but she is tachycardic again since 01/07/2023, remains asymptomatic.  This is likely secondary to deconditioning.Currently on Metropol 50 mg twice daily.  Cannot increase dose due to borderline low blood pressure.  She does endorse some underlying anxiety.  Will give a trial of Xanax 0.25 mg p.o. twice daily scheduled and see if that works.   AKI Likely due to ATN related to septic shock.  Resolved.  Hypokalemia/hypomagnesemia: Replenished.  Pulmonary embolism Left lower lobe segmental PE diagnosed on 9/10, initially started on heparin which was stopped after 1 day due to anemia but then resumed on 12/30/2022.   Acute on chronic anemia Hemoglobin trend as below.  Received a total of 9 units of PRBC so far. 9/21, heparin drip was resumed.  Hemoglobin fairly stable since 12/31/2022.  No active bleeding.  Continue to monitor.  Hemoglobin has remained stable around 7.5 for the last few days.  Hyperglycemia A1c 5.7 on 9//24 Continue SSI/Accu-Cheks  Loss of voice/hoarse voice Postextubation, patient was noted to have dysphagia as well as loss of voice. If no improvement, we'll involve  ENT after acute issues resolve however per patient, her voice is improving but very slowly.   Recurrent breast cancer: Follows up with oncology Dr. Pamelia Hoit   DVT prophylaxis: Place and maintain sequential compression device Start: 12/15/22 1031 SCDs Start: 12/13/22 0413   Code Status: Full Code  Family Communication:  None present at bedside.  Plan of care discussed with patient in length and he/she verbalized understanding and agreed with it.  Status is: Inpatient Remains inpatient appropriate because: Patient is still very sick with multiple problems.     Estimated body mass index is 21.38 kg/m as calculated from the following:   Height as of this encounter: 5\' 4"  (1.626 m).   Weight as of this encounter: 56.5 kg.    Nutritional Assessment: Body mass index is 21.38 kg/m.Marland Kitchen Seen by dietician.  I agree with the assessment and plan as outlined below: Nutrition Status: Nutrition Problem: Moderate Malnutrition Etiology: chronic illness (Crohn's disease; metastatic breast cancer) Signs/Symptoms: moderate fat depletion, moderate muscle depletion, percent weight loss (24% in 4.5 months) Percent weight loss: 24 % (in 4.5 months) Interventions: Tube feeding, TPN  . Skin Assessment: I have examined the patient's skin and I agree with the wound assessment as performed by the wound care RN as outlined below:    Consultants:  ID and general surgery  Procedures:  As above  Antimicrobials:  Anti-infectives (From admission, onward)    Start     Dose/Rate Route  Frequency Ordered Stop   12/29/22 1215  ciprofloxacin (CIPRO) IVPB 400 mg        400 mg 200 mL/hr over 60 Minutes Intravenous 2 times daily 12/29/22 1123     12/29/22 1215  metroNIDAZOLE (FLAGYL) IVPB 500 mg        500 mg 100 mL/hr over 60 Minutes Intravenous 2 times daily 12/29/22 1123     12/28/22 1430  micafungin (MYCAMINE) 100 mg in sodium chloride 0.9 % 100 mL IVPB  Status:  Discontinued        100 mg 105 mL/hr over 1  Hours Intravenous Every 24 hours 12/28/22 1335 01/05/23 0731   12/23/22 2000  vancomycin (VANCOREADY) IVPB 1250 mg/250 mL  Status:  Discontinued        1,250 mg 166.7 mL/hr over 90 Minutes Intravenous Every 36 hours 12/22/22 0612 12/22/22 1103   12/22/22 0645  vancomycin (VANCOREADY) IVPB 1250 mg/250 mL        1,250 mg 166.7 mL/hr over 90 Minutes Intravenous  Once 12/22/22 0551 12/22/22 0805   12/22/22 0600  piperacillin-tazobactam (ZOSYN) IVPB 3.375 g  Status:  Discontinued        3.375 g 12.5 mL/hr over 240 Minutes Intravenous Every 8 hours 12/22/22 0551 12/29/22 1123   12/13/22 1000  piperacillin-tazobactam (ZOSYN) IVPB 3.375 g  Status:  Discontinued        3.375 g 12.5 mL/hr over 240 Minutes Intravenous Every 8 hours 12/13/22 0833 12/20/22 1333         Subjective: Seen and examined.  No complaints.  Upon further questioning, she did endorse some underlying anxiety.  Objective: Vitals:   01/08/23 0421 01/08/23 0500 01/08/23 0600 01/08/23 0710  BP:  103/63 116/76   Pulse:  (!) 111 (!) 122   Resp:  (!) 21 18   Temp: 98.9 F (37.2 C)   100.1 F (37.8 C)  TempSrc: Oral   Oral  SpO2:  98% 98%   Weight:  56.5 kg    Height:        Intake/Output Summary (Last 24 hours) at 01/08/2023 0753 Last data filed at 01/08/2023 0600 Gross per 24 hour  Intake 948.64 ml  Output 2375 ml  Net -1426.36 ml   Filed Weights   01/06/23 0500 01/07/23 0418 01/08/23 0500  Weight: 58.4 kg 57.7 kg 56.5 kg    Examination:  General exam: Appears calm and comfortable  Respiratory system: Clear to auscultation. Respiratory effort normal. Cardiovascular system: S1 & S2 heard, tachycardia. No JVD, murmurs, rubs, gallops or clicks. No pedal edema. Gastrointestinal system: Abdomen is nondistended, soft and mild general tenderness, no organomegaly or masses felt. Normal bowel sounds heard. Central nervous system: Alert and oriented. No focal neurological deficits. Extremities: Symmetric 5 x 5  power. Skin: No rashes, lesions or ulcers.  Psychiatry: Judgement and insight appear normal. Mood & affect appropriate.   Data Reviewed: I have personally reviewed following labs and imaging studies  CBC: Recent Labs  Lab 01/04/23 0500 01/05/23 0506 01/06/23 0700 01/07/23 0306 01/08/23 0541  WBC 23.0* 24.3* 22.5* 21.4* 21.6*  HGB 7.7* 7.6* 8.0* 7.9* 7.8*  HCT 24.5* 23.9* 25.5* 25.8* 25.1*  MCV 89.4 88.5 91.1 91.5 90.3  PLT 388 381 448* 456* 464*   Basic Metabolic Panel: Recent Labs  Lab 01/02/23 0517 01/04/23 0500 01/04/23 0600 01/08/23 0541  NA 138  --  134* 137  K 4.2  --  4.2 3.3*  CL 103  --  101 102  CO2 26  --  25 26  GLUCOSE 121*  --  129* 94  BUN 33*  --  27* 5*  CREATININE 0.86  --  0.64 0.86  CALCIUM 8.0*  --  8.0* 8.0*  MG  --  1.8  --  1.5*  PHOS  --  3.5  --  3.6   GFR: Estimated Creatinine Clearance: 71.3 mL/min (by C-G formula based on SCr of 0.86 mg/dL). Liver Function Tests: Recent Labs  Lab 01/04/23 0600 01/08/23 0541  AST 44* 52*  ALT 30 32  ALKPHOS 196* 156*  BILITOT 0.7 0.5  PROT 6.2* 6.5  ALBUMIN 1.5* 1.8*   No results for input(s): "LIPASE", "AMYLASE" in the last 168 hours. No results for input(s): "AMMONIA" in the last 168 hours. Coagulation Profile: No results for input(s): "INR", "PROTIME" in the last 168 hours.  Cardiac Enzymes: No results for input(s): "CKTOTAL", "CKMB", "CKMBINDEX", "TROPONINI" in the last 168 hours. BNP (last 3 results) No results for input(s): "PROBNP" in the last 8760 hours. HbA1C: No results for input(s): "HGBA1C" in the last 72 hours. CBG: Recent Labs  Lab 01/07/23 0041 01/07/23 0746 01/07/23 1119 01/07/23 1940 01/07/23 2335  GLUCAP 111* 91 136* 85 90   Lipid Profile: Recent Labs    01/08/23 0541  TRIG 145    Thyroid Function Tests: No results for input(s): "TSH", "T4TOTAL", "FREET4", "T3FREE", "THYROIDAB" in the last 72 hours. Anemia Panel: No results for input(s): "VITAMINB12",  "FOLATE", "FERRITIN", "TIBC", "IRON", "RETICCTPCT" in the last 72 hours. Sepsis Labs: Recent Labs  Lab 01/03/23 1257 01/04/23 0924  PROCALCITON  --  0.43  LATICACIDVEN 1.2  --     Recent Results (from the past 240 hour(s))  Culture, fungus without smear     Status: None (Preliminary result)   Collection Time: 01/01/23 11:52 AM   Specimen: Peritoneal Washings; Other  Result Value Ref Range Status   Specimen Description   Final    PERITONEAL Performed at [facility], [address] [place], [place] [address]    Special Requests   Final    NONE Performed at [facility], [address] [place], [place] [address]    Culture   Final    NO FUNGUS ISOLATED AFTER 6 DAYS Performed at [facility], [address], [place], [place] [address]    Report Status PENDING  Incomplete  Acid Fast Smear (AFB)     Status: None   Collection Time: 01/01/23 11:52 AM   Specimen: Peritoneal  Result Value Ref Range Status   AFB Specimen Processing Concentration  Final   Acid Fast Smear Negative  Final    Comment: (NOTE) Performed At: [facility] [address] [place], [place] [id number] Jolene Schimke MD OZ:[id number]    Source (AFB) NONE  Final    Comment: Performed at [facility], [address] [place], [place] [address]  Aerobic/Anaerobic Culture w Gram Stain (surgical/deep wound)     Status: None   Collection Time: 01/01/23 11:52 AM   Specimen: Peritoneal Washings  Result Value Ref Range Status   Specimen Description   Final    PERITONEAL Performed at [facility], [address] [place], [place] [address]    Special Requests   Final    NONE Performed at [facility], [address], [place], [place] [address]    Gram Stain   Final    ABUNDANT WBC PRESENT, PREDOMINANTLY MONONUCLEAR NO ORGANISMS SEEN    Culture  Final    No growth aerobically or  anaerobically. Performed at [facility], [address], [place], [place] [address]    Report Status 01/06/2023 FINAL  Final  Culture, blood (Routine X 2) w Reflex to ID Panel     Status: None   Collection Time: 01/03/23  5:32 PM   Specimen: BLOOD  Result Value Ref Range Status   Specimen Description   Final    BLOOD PICC LINE Performed at [facility], [address], [place], [place] [address]    Special Requests   Final    BOTTLES DRAWN AEROBIC AND ANAEROBIC Blood Culture adequate volume Performed at [facility], [address], [place], [place] [address]    Culture   Final    NO GROWTH 5 DAYS Performed at [facility], [address] [place], [place] [address]    Report Status 01/08/2023 FINAL  Final  Culture, blood (Routine X 2) w Reflex to ID Panel     Status: None   Collection Time: 01/03/23  5:32 PM   Specimen: BLOOD LEFT FOREARM  Result Value Ref Range Status   Specimen Description   Final    BLOOD LEFT FOREARM Performed at [facility], [address] [place], [place] [address]    Special Requests   Final    BOTTLES DRAWN AEROBIC AND ANAEROBIC Blood Culture adequate volume Performed at [facility], [address] [place], [place] [address]    Culture   Final    NO GROWTH 5 DAYS Performed at [facility], [address] [place], [place] [address]    Report Status 01/08/2023 FINAL  Final  Culture, blood (Routine X 2) w Reflex to ID Panel     Status: None (Preliminary result)   Collection Time: 01/04/23 11:40 PM   Specimen: BLOOD LEFT HAND  Result Value Ref Range Status   Specimen Description   Final    BLOOD LEFT HAND Performed at [facility], [address], [place], [place] [address]    Special Requests   Final    BOTTLES DRAWN AEROBIC ONLY Blood Culture adequate volume Performed at [facility], [address] [place], [place] [address]     Culture   Final    NO GROWTH 3 DAYS Performed at [facility], [address], [place], [place] [address]    Report Status PENDING  Incomplete  Culture, blood (Routine X 2) w Reflex to ID Panel     Status: None (Preliminary result)   Collection Time: 01/04/23 11:40 PM   Specimen: BLOOD LEFT ARM  Result Value Ref Range Status   Specimen Description   Final    BLOOD LEFT ARM Performed at [facility], [address] [place], [place] [address]    Special Requests   Final    BOTTLES DRAWN AEROBIC ONLY Blood Culture results may not be optimal due to an inadequate volume of blood received in culture bottles Performed at [facility], [address] [place], [place] [address]    Culture   Final    NO GROWTH 3 DAYS Performed at [facility], [address], [place], [place] [address]    Report Status PENDING  Incomplete     Radiology Studies: No results found.  Scheduled Meds:  ascorbic acid  500 mg Per Tube BID   cholecalciferol  400 Units Per Tube  Daily   diphenoxylate-atropine  1 tablet Per Tube QID   famotidine  20 mg Per Tube Daily   feeding supplement (KATE FARMS STANDARD 1.4)  325 mL Oral BID BM   feeding supplement (KATE FARMS STANDARD 1.4)  360 mL Per Tube Q24H   ferrous sulfate  300 mg Per Tube BID   fiber  1 packet Per Tube TID   gabapentin  200 mg Per Tube Q8H   insulin aspart  0-9 Units Subcutaneous 4 times per day   loperamide HCl  4 mg Per Tube QID   metoprolol tartrate  50 mg Per Tube BID   mouth rinse  15 mL Mouth Rinse 4 times per day   potassium chloride  40 mEq Oral Q4H   sodium chloride flush  10-40 mL Intracatheter Q12H   Vitamin D (Ergocalciferol)  50,000 Units Oral Q7 days   vitamin E  100 Units Per Tube Daily   zinc sulfate  220 mg Per Tube Daily   Continuous Infusions:  ciprofloxacin Stopped (01/08/23 0009)   heparin 1,800 Units/hr (01/08/23 0746)   magnesium sulfate bolus IVPB     metronidazole Stopped  (01/08/23 0005)   phytonadione (VITAMIN K) 2 mg in dextrose 5 % 50 mL IVPB Stopped (01/04/23 1559)     LOS: 26 days   Hughie Closs, MD [facility]  01/08/2023, 7:53 AM   *Please note that this is a verbal dictation therefore any spelling or grammatical errors are due to the "Dragon Medical One" system interpretation.  Please page via Amion and do not message via secure chat for urgent patient care matters. Secure chat can be used for non urgent patient care matters.  How to contact the [facility] Attending or Consulting provider 7A - 7P or covering provider during after hours 7P -7A, for this patient?  Check the care team in [facility] and look for a) attending/consulting TR[facility] provider listed and b) the [facility] team listed. Page or secure chat 7A-7P. Log into [email or web address] and use [facility]'s universal password to access. If you do not have the password, please contact the hospital operator. Locate the [facility] provider you are looking for under [facility] and page to a number that you can be directly reached. If you still have difficulty reaching the provider, please page the [facility] (Director on Call) for the Hospitalists listed on amion for assistance.

## 2023-01-08 NOTE — Progress Notes (Signed)
ANTICOAGULATION CONSULT NOTE  Pharmacy Consult for heparin Indication: pulmonary embolus  Allergies  Allergen Reactions   Nsaids Other (See Comments)    Crohns disease/ IBD   Chlorhexidine Gluconate Itching    Able to use CHG swabs for ports, reaction with direct skin contact   Hydromorphone Other (See Comments)    Confusion/sedation   Lactose Intolerance (Gi) Other (See Comments)    Upset stomach/diarrhea    Patient Measurements: Height: 5\' 4"  (162.6 cm) Weight: 56.5 kg (124 lb 9 oz) IBW/kg (Calculated) : 54.7 Heparin Dosing Weight: TBW  Vital Signs: Temp: 98.1 F (36.7 C) (09/30 1200) Temp Source: Oral (09/30 1200) BP: 107/74 (09/30 1100) Pulse Rate: 118 (09/30 1200)  Labs: Recent Labs    01/06/23 0700 01/06/23 1641 01/07/23 0306 01/08/23 0541 01/08/23 1241  HGB 8.0*  --  7.9* 7.8*  --   HCT 25.5*  --  25.8* 25.1*  --   PLT 448*  --  456* 464*  --   HEPARINUNFRC 0.75*   < > 0.62 0.76* 0.63  CREATININE  --   --   --  0.86  --    < > = values in this interval not displayed.    Estimated Creatinine Clearance: 71.3 mL/min (by C-G formula based on SCr of 0.86 mg/dL).   Medications:  No AC PTA.  Assessment: 47 yo F with a PMH of recurrent breast cancer not currently on treatment, who presented to the ED on 12/12/2022 with abdominal pain, nausea, vomiting diarrhea.   Patient was placed on heparin drip from 9/10-9/12 for new LLL PE. On 9/12 heparin drip was discontinued due to hemoglobin dropping and intermittent bleeding in ostomy.   On 9/21: Pharmacy has been consulted to resume heparin drip after transfusion (Hgb 7, transfused 1 unit PRBC) On 9/27 heparin held for [facility], resumed w/ no bolus at 13:00  01/08/2023 Heparin level 0.63 - therapeutic with heparin infusion at 1800 units/hr Hg 7.8 low but stable, plt stable No complications of therapy noted   Goal of Therapy:  Heparin level 0.3-0.7 units/ml Monitor platelets by anticoagulation protocol: Yes   Plan:   Continue heparin infusion at 1800 units/hr   Daily CBC & Heparin level Monitor for signs/symptoms of bleeding Follow up long-term anticoagulation plans    Pricilla Riffle, PharmD, BCPS Clinical Pharmacist 01/08/2023 1:24 PM

## 2023-01-08 NOTE — Progress Notes (Signed)
ANTICOAGULATION CONSULT NOTE  Pharmacy Consult for heparin Indication: pulmonary embolus  Allergies  Allergen Reactions   Nsaids Other (See Comments)    Crohns disease/ IBD   Chlorhexidine Gluconate Itching    Able to use CHG swabs for ports, reaction with direct skin contact   Hydromorphone Other (See Comments)    Confusion/sedation   Lactose Intolerance (Gi) Other (See Comments)    Upset stomach/diarrhea    Patient Measurements: Height: 5\' 4"  (162.6 cm) Weight: 56.5 kg (124 lb 9 oz) IBW/kg (Calculated) : 54.7 Heparin Dosing Weight: TBW  Vital Signs: Temp: 98.9 F (37.2 C) (09/30 0421) Temp Source: Oral (09/30 0421) BP: 116/76 (09/30 0600) Pulse Rate: 122 (09/30 0600)  Labs: Recent Labs    01/06/23 0700 01/06/23 1641 01/07/23 0306 01/08/23 0541  HGB 8.0*  --  7.9* 7.8*  HCT 25.5*  --  25.8* 25.1*  PLT 448*  --  456* 464*  HEPARINUNFRC 0.75* 0.63 0.62 0.76*    Estimated Creatinine Clearance: 76.7 mL/min (by C-G formula based on SCr of 0.64 mg/dL).   Medications:  No AC PTA.  Assessment: 47 yo F with a PMH of recurrent breast cancer not currently on treatment, who presented to the ED on 12/12/2022 with abdominal pain, nausea, vomiting diarrhea.   Patient was placed on heparin drip from 9/10-9/12 for new LLL PE. On 9/12 heparin drip was discontinued due to hemoglobin dropping and intermittent bleeding in ostomy.   On 9/21: Pharmacy has been consulted to resume heparin drip after transfusion (Hgb 7, transfused 1 unit PRBC) On 9/27 heparin held for [facility], resumed w/ no bolus at 13:00  01/08/2023 Heparin level 0.76: supratherapeutic with heparin infusion at 1900 units/hr Hg 7.8 low but stable, PLT 464 No bleeding reported   Goal of Therapy:  Heparin level 0.3-0.7 units/ml Monitor platelets by anticoagulation protocol: Yes   Plan:  Decrease heparin to 1800 units/hr   Check heparin level 6 hr after rate decrease Daily CBC & Heparin level Monitor for  signs/symptoms of bleeding Follow up long-term anticoagulation plans    Terrilee Files, PharmD 01/08/2023 6:33 AM

## 2023-01-08 NOTE — Progress Notes (Signed)
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 Active Problems:   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   Malignant neoplasm of upper-outer quadrant of right breast in female, estrogen receptor positive (HCC)   Hypokalemia   Port-A-Cath in place   Breast cancer (HCC)   Crohn's disease of both small and large intestine with intestinal obstruction (HCC)   Leukocytosis   Chronic disease anemia   Protein-calorie malnutrition, severe (HCC)   Failure to thrive in adult   Anorexia   Cardiac arrest (HCC)   Septic shock (HCC)   SBO with strangulation s/p resection 12/14/2022   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  Underwent ex lap on 9/5 for bowel resection due to bowel ischemia # Ex lap, ileostomy and abdominal closure on 9/7 - She developed shortness and high output ostomy.  Continued fever and leukocytosis. - CT 9/16 showed large amount of ascites.  She had been on vancomycin and Zosyn at that point ID was engaged.  Micafungin was added on 9/17 for possible fungemia. - Patient has continued to feed her on ciprofloxacin, metronidazole and micafungin.  Blood and fungal cultures have been no growth so far form 9/18 9/7 paracentesis with no growth. - On 9/22 CT abdomen pelvis there is concern for early developing obstruction/peritonitis.  Repeat paracentesis on 9/23.  General surgery engaged and noted that  abdominal exam is not impressive, may need repeat blood cultures source could be lines or urine.  Repeat scan if no other source Thursday or Friday. - Patient does not have urinary symptoms.  As previously noted blood cultures and fungal cultures from 9/18 are with no growth. -As pt continues to fever would recommend all lines to remove any sources. Of note I suspect a developing obstruction/peritonitis may be contributing to fever and leukocytosis as well.  Recommendations: -PICC line was removed on 9/26, repeat blood cultures obtained - Port was removed on 9/27 with general surgery - Blood cultures from 9/26 no growth so far.  Okay to place From ID perspective - Patient has remained afebrile since PICC line removal on 9/26, WBC stable.  General surgery is following given patient is on Cipro and [facility] would like repeat CT sometime this week to assess timeline for antibiotics.   Microbiology:   Antibiotics: Ciprofloxacin, metronidazole, micafungin Cultures: Blood 9/18 fungal blood cultures no growth   SUBJECTIVE: Resting in bed.  No new complaints. Interval: Tmax of 100.1, WBC 21K Review of Systems: Review of Systems  All other systems reviewed and are negative.    Scheduled Meds:  ALPRAZolam  0.25 mg Oral BID   ascorbic acid  500 mg Per Tube BID   diphenoxylate-atropine  1 tablet Per Tube QID   famotidine  20 mg Per Tube Daily   feeding supplement (KATE FARMS STANDARD 1.4)  325 mL Oral BID BM   feeding supplement (KATE FARMS STANDARD 1.4)  360 mL Per  Tube Q24H   ferrous sulfate  300 mg Per Tube BID   fiber  1 packet Per Tube TID   gabapentin  200 mg Per Tube Q8H   insulin aspart  0-9 Units Subcutaneous 4 times per day   loperamide HCl  4 mg Per Tube QID   metoprolol tartrate  50 mg Per Tube BID   mouth rinse  15 mL Mouth Rinse 4 times per day   potassium chloride  40 mEq Oral Q4H   sodium chloride flush  10-40 mL Intracatheter Q12H   Vitamin D (Ergocalciferol)  50,000  Units Oral Q7 days   vitamin E  100 Units Per Tube Daily   zinc sulfate  220 mg Per Tube Daily   Continuous Infusions:  ciprofloxacin 400 mg (01/08/23 1111)   heparin 1,800 Units/hr (01/08/23 1000)   metronidazole Stopped (01/08/23 1049)   phytonadione (VITAMIN K) 2 mg in dextrose 5 % 50 mL IVPB Stopped (01/04/23 1559)   TPN CYCLIC-ADULT (ION)     PRN Meds:.acetaminophen, albuterol, artificial tears, fentaNYL (SUBLIMAZE) injection, food thickener, labetalol, lip balm, LORazepam, [DISCONTINUED] ondansetron **OR** ondansetron (ZOFRAN) IV, mouth rinse, mouth rinse, mouth rinse, mouth rinse, oxyCODONE, silver nitrate applicators, sodium chloride flush Allergies  Allergen Reactions   Nsaids Other (See Comments)    Crohns disease/ IBD   Chlorhexidine Gluconate Itching    Able to use CHG swabs for ports, reaction with direct skin contact   Hydromorphone Other (See Comments)    Confusion/sedation   Lactose Intolerance (Gi) Other (See Comments)    Upset stomach/diarrhea    OBJECTIVE: Vitals:   01/08/23 0800 01/08/23 0900 01/08/23 1000 01/08/23 1100  BP: 106/68 115/89 109/82 107/74  Pulse: (!) 115 (!) 118 (!) 118 (!) 122  Resp: 19 16 (!) 21 17  Temp:      TempSrc:      SpO2: 99% 99% 100% 98%  Weight:      Height:       Body mass index is 21.38 kg/m.  Physical Exam Constitutional:      Appearance: Normal appearance.  HENT:     Head: Normocephalic and atraumatic.     Right Ear: Tympanic membrane normal.     Left Ear: Tympanic membrane normal.     Nose: Nose normal.     Mouth/Throat:     Mouth: Mucous membranes are moist.  Eyes:     Extraocular Movements: Extraocular movements intact.     Conjunctiva/sclera: Conjunctivae normal.     Pupils: Pupils are equal, round, and reactive to light.  Cardiovascular:     Rate and Rhythm: Normal rate and regular rhythm.     Heart sounds: No murmur heard.    No friction rub. No gallop.     Comments: Port out Pulmonary:     Effort:  Pulmonary effort is normal.     Breath sounds: Normal breath sounds.  Abdominal:     Comments: Abdominal wound  Musculoskeletal:        General: Normal range of motion.  Skin:    General: Skin is warm and dry.  Neurological:     General: No focal deficit present.     Mental Status: She is alert and oriented to person, place, and time.  Psychiatric:        Mood and Affect: Mood normal.       Lab Results Lab Results  Component Value Date   WBC 21.6 (H) 01/08/2023   HGB 7.8 (L) 01/08/2023   HCT 25.1 (  L) 01/08/2023   MCV 90.3 01/08/2023   PLT 464 (H) 01/08/2023    Lab Results  Component Value Date   CREATININE 0.86 01/08/2023   BUN 5 (L) 01/08/2023   NA 137 01/08/2023   K 3.3 (L) 01/08/2023   CL 102 01/08/2023   CO2 26 01/08/2023    Lab Results  Component Value Date   ALT 32 01/08/2023   AST 52 (H) 01/08/2023   ALKPHOS 156 (H) 01/08/2023   BILITOT 0.5 01/08/2023        Danelle Earthly, MD [facility] 01/08/2023, 11:41 AM I have personally spent 52 minutes involved in face-to-face and non-face-to-face activities for this patient on the day of the visit. Professional time spent includes the following activities: Preparing to see the patient (review of tests), Obtaining and/or reviewing separately obtained history (admission/discharge record), Performing a medically appropriate examination and/or evaluation , Ordering medications/tests/procedures, referring and communicating with other health care professionals, Documenting clinical information in the EMR, Independently interpreting results (not separately reported), Communicating results to the patient/family/caregiver, Counseling and educating the patient/family/caregiver and Care coordination (not separately reported).

## 2023-01-09 ENCOUNTER — Inpatient Hospital Stay (HOSPITAL_COMMUNITY): Payer: Managed Care, Other (non HMO)

## 2023-01-09 DIAGNOSIS — K90829 Short bowel syndrome, unspecified: Secondary | ICD-10-CM | POA: Diagnosis not present

## 2023-01-09 HISTORY — PX: IR FLUORO GUIDE CV LINE RIGHT: IMG2283

## 2023-01-09 HISTORY — PX: IR US GUIDE VASC ACCESS RIGHT: IMG2390

## 2023-01-09 LAB — GLUCOSE, CAPILLARY
Glucose-Capillary: 92 mg/dL (ref 70–99)
Glucose-Capillary: 114 mg/dL — ABNORMAL HIGH (ref 70–99)
Glucose-Capillary: 166 mg/dL — ABNORMAL HIGH (ref 70–99)

## 2023-01-09 LAB — HEPARIN LEVEL (UNFRACTIONATED): Heparin Unfractionated: 0.68 [IU]/mL (ref 0.30–0.70)

## 2023-01-09 LAB — BASIC METABOLIC PANEL
Anion gap: 9 (ref 5–15)
BUN: 6 mg/dL (ref 6–20)
CO2: 24 mmol/L (ref 22–32)
Calcium: 8.3 mg/dL — ABNORMAL LOW (ref 8.9–10.3)
Chloride: 98 mmol/L (ref 98–111)
Creatinine, Ser: 0.8 mg/dL (ref 0.44–1.00)
GFR, Estimated: >60 mL/min (ref >60)
Glucose, Bld: 104 mg/dL — ABNORMAL HIGH (ref 70–99)
Potassium: 3.9 mmol/L (ref 3.5–5.1)
Sodium: 131 mmol/L — ABNORMAL LOW (ref 135–145)

## 2023-01-09 LAB — PHOSPHORUS: Phosphorus: 3.1 mg/dL (ref 2.5–4.6)

## 2023-01-09 LAB — MAGNESIUM: Magnesium: 1.8 mg/dL (ref 1.7–2.4)

## 2023-01-09 MED ORDER — TRAVASOL 10 % IV SOLN
INTRAVENOUS | Status: AC
  Filled 2023-01-09: qty 940.8

## 2023-01-09 MED ORDER — LIDOCAINE-EPINEPHRINE 1 %-1:100000 IJ SOLN
INTRAMUSCULAR | Status: AC
  Filled 2023-01-09: qty 1

## 2023-01-09 MED ORDER — LIDOCAINE-EPINEPHRINE 1 %-1:100000 IJ SOLN
20.0000 mL | Freq: Once | INTRAMUSCULAR | Status: AC
  Administered 2023-01-09: 10 mL via INTRADERMAL

## 2023-01-09 NOTE — Progress Notes (Signed)
Physical Therapy Treatment Patient Details Name: [name] MRN: [id number] DOB: Oct 23, 1975 Today's Date: 01/09/2023   History of Present Illness Patient is a 47 year old female who presented on 9/3 with abdominal distension and nausea/vomiting. 9/4 patient was found in ED bathroom with no pulse with ROSC in about 5 mins. Patient was intubated at this time. 9/5 patient underwent exploratory laparotomy that revealed internal hernia with associated small bowel ischemia with patient undergoing bowel resection. 9/7 patient returned to OR for ostomy placement. Patient was extubated on 9/10 and also found to have PE. PMH: breast cancer, Chron's disease, IDA, recent hospitalization 8/10 to 8/15 with bowel obstruction and EGD    PT Comments  Pt seen in ICU RM # [id number] Pt AxO x 3 feeling "motivated".  Assisted OOB to amb.  General bed mobility comments: pt self able with asisst to multiple line management.  General transfer comment: pt able to rise at [facility] Assist with VC's on proper hand placement.  Used B platform EVA walker this session to increase amb distance and upright activity. General Gait Details: used B platform EVA walker this session due to prfound weakness and extended length of stay.  Pt tolerated amb 52 feet with one brief standing rest break.  RA 99% and HR 102 Will try a regular walker next visit.  Pt hopes to be able to go home before Halloween.    If plan is discharge home, recommend the following: A lot of help with walking and/or transfers;A lot of help with bathing/dressing/bathroom;Assistance with cooking/housework;Assist for transportation   Can travel by private vehicle     No  Equipment Recommendations  Rolling walker (2 wheels)    Recommendations for Other Services       Precautions / Restrictions Precautions Precautions: Fall Precaution Comments: abdominal precautions, wound vac, abominal binder, ostomy, nasal tube Restrictions Weight Bearing Restrictions: No      Mobility  Bed Mobility Overal bed mobility: Needs Assistance Bed Mobility: Supine to Sit     Supine to sit: Supervision, Contact guard     General bed mobility comments: pt self able with asisst to multiple line management    Transfers Overall transfer level: Needs assistance Equipment used: Bilateral platform walker Transfers: Sit to/from Stand Sit to Stand: Min assist, +2 safety/equipment           General transfer comment: pt able to rise at [facility] Assist with VC's on proper hand placement.  Used B platform EVA walker this session to increase amb distance and upright activity.    Ambulation/Gait Ambulation/Gait assistance: Mod assist Gait Distance (Feet): 52 Feet Assistive device: Fara Boros Gait Pattern/deviations: Step-through pattern, Decreased stride length, Narrow base of support Gait velocity: decr     General Gait Details: used B platform EVA walker this session due to prfound weakness and extended length of stay.  Pt tolerated amb 52 feet with one brief standing rest break.  RA 99% and HR 102   Stairs             Wheelchair Mobility     Tilt Bed    Modified Rankin (Stroke Patients Only)       Balance                                            Cognition Arousal: Alert Behavior During Therapy: WFL for tasks assessed/performed Overall Cognitive Status:  Within Functional Limits for tasks assessed                                 General Comments: AxO x 3 pleasant and willing.  Voice is getting stronger        Exercises      General Comments        Pertinent Vitals/Pain Pain Assessment Pain Assessment: No/denies pain    Home Living                          Prior Function            PT Goals (current goals can now be found in the care plan section) Progress towards PT goals: Progressing toward goals    Frequency    Min 1X/week      PT Plan      Co-evaluation               AM-PAC PT "6 Clicks" Mobility   Outcome Measure  Help needed turning from your back to your side while in a flat bed without using bedrails?: A Little Help needed moving from lying on your back to sitting on the side of a flat bed without using bedrails?: A Lot Help needed moving to and from a bed to a chair (including a wheelchair)?: A Lot Help needed standing up from a chair using your arms (e.g., wheelchair or bedside chair)?: A Lot Help needed to walk in hospital room?: A Lot Help needed climbing 3-5 steps with a railing? : A Lot 6 Click Score: 13    End of Session Equipment Utilized During Treatment: Gait belt Activity Tolerance: Patient tolerated treatment well Patient left: in chair;with call bell/phone within reach Nurse Communication: Mobility status PT Visit Diagnosis: Unsteadiness on feet (R26.81);Muscle weakness (generalized) (M62.81);Difficulty in walking, not elsewhere classified (R26.2)     Time: 1004-1030 PT Time Calculation (min) (ACUTE ONLY): 26 min  Charges:    $Gait Training: 8-22 mins $Therapeutic Activity: 8-22 mins PT General Charges $$ ACUTE PT VISIT: 1 Visit                     Felecia Shelling  PTA Acute  Rehabilitation Services Office M-F          [phone]

## 2023-01-09 NOTE — TOC Progression Note (Signed)
Transition of Care [facility]) - Progression Note    Patient Details  Name: [name] MRN: [id number] Date of Birth: Aug 12, 1975  [facility]) CM/SW Contact  Darleene Cleaver, [place] Phone Number: 01/09/2023, 1:07 PM  Clinical Narrative:     TOC continuing to follow patient's progress.  This CSW asked if physician would like LTACH to review, per MD, he said to wait two days.  TOC to continue to follow patient throughout discharge planning.  Expected Discharge Plan: Home/Self Care (unsure at this time) Barriers to Discharge: Continued Medical Work up  Expected Discharge Plan and Services In-house Referral: Hospice / Palliative Care Discharge Planning Services: CM Consult Post Acute Care Choice: NA Living arrangements for the past 2 months: Single Family Home                 DME Arranged: N/A DME Agency: NA       HH Arranged: NA HH Agency: NA         Social Determinants of Health (SDOH) Interventions SDOH Screenings   Food Insecurity: No Food Insecurity (11/21/2022)  Housing: Patient Unable To Answer (11/21/2022)  Transportation Needs: No Transportation Needs (11/21/2022)  Utilities: Not At Risk (11/21/2022)  Tobacco Use: Medium Risk (01/05/2023)    Readmission Risk Interventions    12/20/2022    3:50 PM 12/15/2022    2:43 PM  Readmission Risk Prevention Plan  Transportation Screening Complete Complete  PCP or Specialist Appt within 3-5 Days  Complete  HRI or Home Care Consult  Complete  Social Work Consult for Recovery Care Planning/Counseling  Complete  Palliative Care Screening  Complete  Medication Review Oceanographer) Complete Complete  PCP or Specialist appointment within 3-5 days of discharge Complete   HRI or Home Care Consult Complete   SW Recovery Care/Counseling Consult Complete   Palliative Care Screening Complete   Skilled Nursing Facility Not Applicable

## 2023-01-09 NOTE — Progress Notes (Signed)
Speech Language Pathology Treatment: Dysphagia  Patient Details Name: [name] MRN: [id number] DOB: [birth year] Today's Date: 01/09/2023 Time: 0347-4259 SLP Time Calculation (min) (ACUTE ONLY): 15 min  Assessment / Plan / Recommendation Clinical Impression  Patient seen by SLP for skilled treatment focused on dysphagia goals. Patient was awake, alert, receptive to having some PO's. She reported that has been drinking some liquids but still has not had much of solids. Family has been approved to bring in PO's from outside hospital that align with her current recommended diet consistencies. Patient reported that family will bring her things "If I ask" but that "I haven't asked yet." SLP reviewed and updated swallow precaution signs in room. Patient does not like Provale cup and prefers to take small sips from a regular cup. SLP assessed her toleration of nectar thick liquids via cup sips. Swallow initiation appeared timely and no overt s/s aspiration observed. When asked about her voice, patient said, "it comes and goes." SLP observed her to be mostly aphonic but with some voicing her when she was putting forth more effort. SLP discussed plan for repeat MBS and patient requested "not today." SLP will plan to return next date. Patient in agreement to plan for MBS this week.   HPI HPI: Per CCM note "47 year old female with past medical history of recurrent breast cancer not currently on treatment, Crohn's disease s/p subtotal colectomy in 2021 recurrent obstructions, colonic stricture, IDA, recent hospitalization 8/10-8/15/2024 for obstruction seen by GI and general surgery. Underwent EGD and flexible sigmoidoscopy and discharged on steroids who presented to the emergency department on 12/12/2022 with abdominal pain, nausea, vomiting diarrhea. In the ED tachycardic, hypertensive. ED provider notes abdominal distention. She was given 2L IVF, morphine, IV PPI. Labs with WBC 10.8, Hgb 10.7, K 3.2. CT AP was  ordered and showed markedly dilated distal small bowel consistent with chronic obstruction. 12/13/2022 patient was found on the floor in the bathroom in ED pulseless. They estimate downtime around 5 minutes. ACLS was initiated. Concern for possible torsades de pointes and given IV mag, defibrillated. Coded for around 5 minutes. She was intubated. She had copious vomitus in the ETT. ROSC achieved and ICU was consulted. Notably, she had I-stat peri-code which showed hemoglobin of 5.5 down from 10 overnight."  Pt was extubated 12/18/2021.  Pt has been maintained on TPN and is now getting trickle feeds for nutrition. SLP following for dysphagia management.  Pt dislikes puree foods per notes on 9/23 and diet was advanced to mechanical soft on 9/24.  Intake remains compromised as pt reports feeling full due to overnight feeds - therefore tube feeding stopped per surgery.  She is continuing to have fevers but denies worsening pain - ID on board.  Pt has remained aphonic since intubation 9/4 - and extubation on 9/10.      SLP Plan  Continue with current plan of care      Recommendations for follow up therapy are one component of a multi-disciplinary discharge planning process, led by the attending physician.  Recommendations may be updated based on patient status, additional functional criteria and insurance authorization.    Recommendations  Diet recommendations: Nectar-thick liquid;Dysphagia 3 (mechanical soft) Liquids provided via: Cup;Teaspoon Medication Administration: Whole meds with puree Supervision: Patient able to self feed Compensations: Slow rate;Small sips/bites Postural Changes and/or Swallow Maneuvers:  (HOB no higher than 45 degrees)                  Oral care BID  Intermittent Supervision/Assistance Dysphagia, oropharyngeal phase (R13.12)     Continue with current plan of care    [name], MA, CCC-SLP Speech Therapy

## 2023-01-09 NOTE — Progress Notes (Signed)
PROGRESS NOTE    [name]  ACZ:[id number] DOB: 12/15/1975 DOA: 12/12/2022 PCP: Ollen Bowl, MD   Brief Narrative:  [name] is a 47 y.o. female with PMH significant for Crohn's disease with previous bowel obstruction s/p subtotal colectomy 2021, colonic stricture, IDA, breast cancer s/p mastectomy 2022 currently not on treatment. Recently hospitalized 8/10-8/15/2024 for bowel obstruction seen by GI and general surgery. Underwent EGD and flexible sigmoidoscopy and discharged on steroids.   9/3, patient presented to the ED with abdominal distention, pain, nausea, vomiting CT abd/pelvis showed markedly dilated distal small bowel consistent with chronic obstruction. No changes since prior study.  Patient was admitted to [facility] with a plan of GI consultation.   9/4, in the morning, within few hours of admission, patient was found pulseless on the floor in the bathroom in ED, estimated downtime about 5 minutes. ACLS was initiated. Concern for possible torsades de pointes and given IV mag, defibrillated.  ROSC achieved in about 5 minutes. She was intubated. She had copious vomitus in the ETT.  Notably, she had I-stat peri-code which showed hemoglobin of 5.5 down from 10 overnight.  Admitted to ICU In the ICU, patient had right femoral CVC placed.  Given 3 minutes of PRBC, 3 units of FFP.  Patient was started on vasopressors   9/5, underwent exploratory laparotomy.  Noted to have internal hernia with associated small bowel ischemia.  Underwent bowel resection. 9/7, back to OR for ostomy placement 9/8, weaned off Levophed 9/10, extubated 9/10, found to have PE in CT angio chest.  Started on heparin drip which was subsequently stopped the next day because of drop in hemoglobin 9/12, mental status improved, became more interactive.  However failed swallow eval. 9/16, transferred out to [facility] 9/16, CT abdomen showed peritoneal fluid collection 9/17, underwent paracentesis, 500 cc of  clear yellow fluid drained 9/22, CT abdomen raised concern of early developing obstruction, peritonitis.  9/23, paracentesis by IR.  Drained 90 cc of turbid, amber/blood-tinged fluid   Patient's hospitalization has been prolonged because of persistent fever, persistent high output from ostomy, anemia requiring blood transfusion, dysphagia.  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 Hillery Hunter s/p ex lap, ileostomy, abdominal closure 9/7 Dr. Hillery Hunter Currently only has 100 cm of small bowel left and hence started syndrome.  TPN dependent To slow down ostomy output, patient is currently on Imodium, iron, FiberCon, Imodium.  Surgery following and managing.   Dysphagia Postextubation, patient failed speech therapy reevaluation multiple amps.  Gradually improving and currently  on dysphagia 3 diet, tube feeding during the day and TPN during the night.    Persistent sepsis/fever of unknown origin Patient continued to have persistent fever, tachycardia and  leukocytosis Extensive workup done.  9/19, ID consulted.  Currently on ciprofloxacin, Flagyl but micafungin was discontinued. 9/22, CT abdomen raised concern of early developing obstruction, peritonitis. 9/23, paracentesis by IR.  Drained 90 cc of turbid, amber/blood-tinged fluid, culture negative.  Seen by ID 01/03/2023, they recommend line holiday.  picc was removed on 01/04/2023.  Patient's last temperature spike was prior to that and was 100.4 at 7 AM same day.  Patient's port was removed by general surgery on 01/05/2023 as well.  General surgery recommended central line again to resume TPN.  ID cleared her for that.  IV team was consulted but they recommended consulting IR for central line instead of the PICC line.  IR was consulted last evening.  All recent cultures have remained negative.  Persistent tachycardia Tachycardia had improved but she is tachycardic again since 01/07/2023, remains asymptomatic.  This is likely secondary to deconditioning.Currently on Metropol 50 mg twice daily.  Cannot increase dose due to borderline low blood pressure.  She does endorse some underlying anxiety.  Started on trial of Xanax 0.25 mg p.o. twice daily scheduled and see if that works.  Still tachycardic.  Will give her another day of angiolytics.   AKI Likely due to ATN related to septic shock.  Resolved.  Hypokalemia/hypomagnesemia: Replenished.  Pulmonary embolism Left lower lobe segmental PE diagnosed on 9/10, initially started on heparin which was stopped after 1 day due to anemia but then resumed on 12/30/2022.   Acute on chronic anemia Hemoglobin trend as below.  Received a total of 9 units of PRBC so far. 9/21, heparin drip was resumed.  Hemoglobin fairly stable since 12/31/2022.  No active bleeding.  Continue to monitor.  Hemoglobin has remained stable around 7.5 for the last few days.  Hyperglycemia A1c 5.7 on 9//24 Continue SSI/Accu-Cheks  Loss of voice/hoarse voice Postextubation, patient  was noted to have dysphagia as well as loss of voice.  Patient's voice gets stronger at times.  It looks like generalized weakness is playing a role as well.  If no improvement and if she remains in the hospital for prolonged period of time, consulting ENT while inpatient might not be a bad idea.   Recurrent breast cancer: Follows up with oncology Dr. Pamelia Hoit   DVT prophylaxis: Place and maintain sequential compression device Start: 12/15/22 1031 SCDs Start: 12/13/22 0413   Code Status: Full Code  Family Communication:  None present at bedside.  Plan of care discussed with patient in length and he/she verbalized understanding and agreed with it.  Status is: Inpatient Remains inpatient appropriate because: Patient is still very sick with multiple problems.     Estimated body mass index is 20.78 kg/m as calculated from the following:   Height as of this encounter: 5\' 4"  (1.626 m).   Weight as of this encounter: 54.9 kg.    Nutritional Assessment: Body mass index is 20.78 kg/m.Marland Kitchen Seen by dietician.  I agree with the assessment and plan as outlined below: Nutrition Status: Nutrition Problem: Moderate Malnutrition Etiology: chronic illness (Crohn's disease; metastatic breast cancer) Signs/Symptoms: moderate fat depletion, moderate muscle depletion, percent weight loss (24% in 4.5 months) Percent weight loss: 24 % (in 4.5 months) Interventions: Tube feeding, TPN  . Skin Assessment: I have examined the patient's skin and I agree with the wound assessment as performed  by the wound care RN as outlined below:    Consultants:  ID and general surgery  Procedures:  As above  Antimicrobials:  Anti-infectives (From admission, onward)    Start     Dose/Rate Route Frequency Ordered Stop   12/29/22 1215  ciprofloxacin (CIPRO) IVPB 400 mg        400 mg 200 mL/hr over 60 Minutes Intravenous 2 times daily 12/29/22 1123     12/29/22 1215  metroNIDAZOLE (FLAGYL) IVPB 500 mg        500  mg 100 mL/hr over 60 Minutes Intravenous 2 times daily 12/29/22 1123     12/28/22 1430  micafungin (MYCAMINE) 100 mg in sodium chloride 0.9 % 100 mL IVPB  Status:  Discontinued        100 mg 105 mL/hr over 1 Hours Intravenous Every 24 hours 12/28/22 1335 01/05/23 0731   12/23/22 2000  vancomycin (VANCOREADY) IVPB 1250 mg/250 mL  Status:  Discontinued        1,250 mg 166.7 mL/hr over 90 Minutes Intravenous Every 36 hours 12/22/22 0612 12/22/22 1103   12/22/22 0645  vancomycin (VANCOREADY) IVPB 1250 mg/250 mL        1,250 mg 166.7 mL/hr over 90 Minutes Intravenous  Once 12/22/22 0551 12/22/22 0805   12/22/22 0600  piperacillin-tazobactam (ZOSYN) IVPB 3.375 g  Status:  Discontinued        3.375 g 12.5 mL/hr over 240 Minutes Intravenous Every 8 hours 12/22/22 0551 12/29/22 1123   12/13/22 1000  piperacillin-tazobactam (ZOSYN) IVPB 3.375 g  Status:  Discontinued        3.375 g 12.5 mL/hr over 240 Minutes Intravenous Every 8 hours 12/13/22 0833 12/20/22 1333         Subjective: Patient seen and examined.  She has no complaints.  Objective: Vitals:   01/09/23 0000 01/09/23 0357 01/09/23 0400 01/09/23 0500  BP: 100/72  98/63   Pulse: (!) 115 (!) 117 (!) 112   Resp: 17 20 19    Temp:  99.1 F (37.3 C)    TempSrc:  Oral    SpO2: 99% 100% 99%   Weight:    54.9 kg  Height:        Intake/Output Summary (Last 24 hours) at 01/09/2023 0752 Last data filed at 01/09/2023 7829 Gross per 24 hour  Intake 2028.76 ml  Output 2325 ml  Net -296.24 ml   Filed Weights   01/07/23 0418 01/08/23 0500 01/09/23 0500  Weight: 57.7 kg 56.5 kg 54.9 kg    Examination:  General exam: Appears calm and comfortable  Respiratory system: Clear to auscultation. Respiratory effort normal. Cardiovascular system: S1 & S2 heard, sinus tachycardia. No JVD, murmurs, rubs, gallops or clicks. No pedal edema. Gastrointestinal system: Abdomen is nondistended, soft and mild generalized tenderness. No organomegaly or  masses felt. Normal bowel sounds heard. Central nervous system: Alert and oriented. No focal neurological deficits. Extremities: Symmetric 5 x 5 power. Skin: No rashes, lesions or ulcers.  Psychiatry: Judgement and insight appear normal. Mood & affect appropriate.    Data Reviewed: I have personally reviewed following labs and imaging studies  CBC: Recent Labs  Lab 01/04/23 0500 01/05/23 0506 01/06/23 0700 01/07/23 0306 01/08/23 0541  WBC 23.0* 24.3* 22.5* 21.4* 21.6*  HGB 7.7* 7.6* 8.0* 7.9* 7.8*  HCT 24.5* 23.9* 25.5* 25.8* 25.1*  MCV 89.4 88.5 91.1 91.5 90.3  PLT 388 381 448* 456* 464*   Basic Metabolic Panel: Recent Labs  Lab 01/04/23 0500 01/04/23 0600 01/08/23  0541  NA  --  134* 137  K  --  4.2 3.3*  CL  --  101 102  CO2  --  25 26  GLUCOSE  --  129* 94  BUN  --  27* 5*  CREATININE  --  0.64 0.86  CALCIUM  --  8.0* 8.0*  MG 1.8  --  1.5*  PHOS 3.5  --  3.6   GFR: Estimated Creatinine Clearance: 71.3 mL/min (by C-G formula based on SCr of 0.86 mg/dL). Liver Function Tests: Recent Labs  Lab 01/04/23 0600 01/08/23 0541  AST 44* 52*  ALT 30 32  ALKPHOS 196* 156*  BILITOT 0.7 0.5  PROT 6.2* 6.5  ALBUMIN 1.5* 1.8*   No results for input(s): "LIPASE", "AMYLASE" in the last 168 hours. No results for input(s): "AMMONIA" in the last 168 hours. Coagulation Profile: No results for input(s): "INR", "PROTIME" in the last 168 hours.  Cardiac Enzymes: No results for input(s): "CKTOTAL", "CKMB", "CKMBINDEX", "TROPONINI" in the last 168 hours. BNP (last 3 results) No results for input(s): "PROBNP" in the last 8760 hours. HbA1C: No results for input(s): "HGBA1C" in the last 72 hours. CBG: Recent Labs  Lab 01/07/23 2335 01/08/23 0801 01/08/23 1143 01/08/23 1933 01/08/23 2322  GLUCAP 90 83 150* 78 93   Lipid Profile: Recent Labs    01/08/23 0541  TRIG 145    Thyroid Function Tests: No results for input(s): "TSH", "T4TOTAL", "FREET4", "T3FREE",  "THYROIDAB" in the last 72 hours. Anemia Panel: No results for input(s): "VITAMINB12", "FOLATE", "FERRITIN", "TIBC", "IRON", "RETICCTPCT" in the last 72 hours. Sepsis Labs: Recent Labs  Lab 01/03/23 1257 01/04/23 0924  PROCALCITON  --  0.43  LATICACIDVEN 1.2  --     Recent Results (from the past 240 hour(s))  Culture, fungus without smear     Status: None (Preliminary result)   Collection Time: 01/01/23 11:52 AM   Specimen: Peritoneal Washings; Other  Result Value Ref Range Status   Specimen Description   Final    PERITONEAL Performed at [facility], [address] [place], [place] [address]    Special Requests   Final    NONE Performed at [facility], [address], [place], [place] [address]    Culture   Final    NO FUNGUS ISOLATED AFTER 7 DAYS Performed at [facility], [address] [place], [place] [address]    Report Status PENDING  Incomplete  Acid Fast Smear (AFB)     Status: None   Collection Time: 01/01/23 11:52 AM   Specimen: Peritoneal  Result Value Ref Range Status   AFB Specimen Processing Concentration  Final   Acid Fast Smear Negative  Final    Comment: (NOTE) Performed At: [facility] [address] [place], [place] [id number] Jolene Schimke MD IO:[id number]    Source (AFB) NONE  Final    Comment: Performed at [facility], [address] [place], [place] [address]  Aerobic/Anaerobic Culture w Gram Stain (surgical/deep wound)     Status: None   Collection Time: 01/01/23 11:52 AM   Specimen: Peritoneal Washings  Result Value Ref Range Status   Specimen Description   Final    PERITONEAL Performed at [facility], [address], [place], [place] [address]    Special Requests   Final    NONE Performed at [facility], [address] [place], [place] [address]    Gram Stain  Final    ABUNDANT WBC PRESENT, PREDOMINANTLY  MONONUCLEAR NO ORGANISMS SEEN    Culture   Final    No growth aerobically or anaerobically. Performed at [facility], [address] [place], [place] [address]    Report Status 01/06/2023 FINAL  Final  Culture, blood (Routine X 2) w Reflex to ID Panel     Status: None   Collection Time: 01/03/23  5:32 PM   Specimen: BLOOD  Result Value Ref Range Status   Specimen Description   Final    BLOOD PICC LINE Performed at [facility], [address], [place], [place] [address]    Special Requests   Final    BOTTLES DRAWN AEROBIC AND ANAEROBIC Blood Culture adequate volume Performed at [facility], [address], [place], [place] [address]    Culture   Final    NO GROWTH 5 DAYS Performed at [facility], [address], [place], [place] [address]    Report Status 01/08/2023 FINAL  Final  Culture, blood (Routine X 2) w Reflex to ID Panel     Status: None   Collection Time: 01/03/23  5:32 PM   Specimen: BLOOD LEFT FOREARM  Result Value Ref Range Status   Specimen Description   Final    BLOOD LEFT FOREARM Performed at [facility], [address] [place], [place] [address]    Special Requests   Final    BOTTLES DRAWN AEROBIC AND ANAEROBIC Blood Culture adequate volume Performed at [facility], [address] [place], [place] [address]    Culture   Final    NO GROWTH 5 DAYS Performed at [facility], [address], [place], [place] [address]    Report Status 01/08/2023 FINAL  Final  Culture, blood (Routine X 2) w Reflex to ID Panel     Status: None (Preliminary result)   Collection Time: 01/04/23 11:40 PM   Specimen: BLOOD LEFT HAND  Result Value Ref Range Status   Specimen Description   Final    BLOOD LEFT HAND Performed at [facility], [address] [place], [place] [address]    Special Requests   Final    BOTTLES DRAWN AEROBIC ONLY Blood Culture adequate volume Performed at  [facility], [address] [place], [place] [address]    Culture   Final    NO GROWTH 4 DAYS Performed at [facility], [address] [place], [place] [address]    Report Status PENDING  Incomplete  Culture, blood (Routine X 2) w Reflex to ID Panel     Status: None (Preliminary result)   Collection Time: 01/04/23 11:40 PM   Specimen: BLOOD LEFT ARM  Result Value Ref Range Status   Specimen Description   Final    BLOOD LEFT ARM Performed at [facility], [address], [place], [place] [address]    Special Requests   Final    BOTTLES DRAWN AEROBIC ONLY Blood Culture results may not be optimal due to an inadequate volume of blood received in culture bottles Performed at [facility], [address] [place], [place] [address]    Culture   Final    NO GROWTH 4 DAYS Performed at [facility], [address], [place], [place] [address]    Report Status PENDING  Incomplete  Culture, blood (Routine X 2) w Reflex to ID Panel  Status: None (Preliminary result)   Collection Time: 01/08/23  9:37 AM   Specimen: BLOOD  Result Value Ref Range Status   Specimen Description   Final    BLOOD SITE NOT SPECIFIED Performed at [facility], [address], [place], [place] [address]    Special Requests   Final    BOTTLES DRAWN AEROBIC AND ANAEROBIC Blood Culture adequate volume Performed at [facility], [address], [place], [place] [address]    Culture   Final    NO GROWTH < 24 HOURS Performed at [facility], [address], [place], [place] [address]    Report Status PENDING  Incomplete  Culture, blood (Routine X 2) w Reflex to ID Panel     Status: None (Preliminary result)   Collection Time: 01/08/23  9:37 AM   Specimen: BLOOD  Result Value Ref Range Status   Specimen Description   Final    BLOOD SITE NOT SPECIFIED Performed at [facility], [address], [place], [place] [address]    Special Requests   Final    BOTTLES DRAWN AEROBIC AND ANAEROBIC Blood Culture adequate volume Performed at [facility], [address], [place], [place] [address]    Culture   Final    NO GROWTH < 24 HOURS Performed at [facility], [address], [place], [place] [address]    Report Status PENDING  Incomplete     Radiology Studies: [place] EKG SITE RITE  Result Date: 01/08/2023 If Site Rite image not attached, placement could not be confirmed due to current cardiac rhythm.   Scheduled Meds:  ALPRAZolam  0.25 mg Oral BID   ascorbic acid  500 mg Per Tube BID   diphenoxylate-atropine  2 tablet Per Tube QID   famotidine  20 mg Per Tube Daily   feeding supplement (KATE FARMS STANDARD 1.4)  325 mL Oral BID BM   feeding supplement (KATE FARMS STANDARD 1.4)  360 mL Per Tube Q24H   ferrous sulfate  300 mg Per Tube BID   fiber  1 packet Per Tube TID   gabapentin  200 mg Per Tube Q8H   insulin aspart  0-9 Units Subcutaneous 4 times per day   loperamide HCl  4 mg Per Tube QID   metoprolol tartrate  50 mg Per Tube BID   mouth rinse  15 mL Mouth Rinse 4 times per day   sodium chloride flush  10-40 mL Intracatheter Q12H   Vitamin D (Ergocalciferol)  50,000 Units Oral Q7 days   vitamin E  100 Units Per Tube Daily   zinc sulfate  220 mg Per Tube Daily   Continuous Infusions:  ciprofloxacin Stopped (01/08/23 2300)   heparin 1,800 Units/hr (01/09/23 0700)   metronidazole Stopped (01/08/23 2301)     LOS: 27 days   Hughie Closs, MD [facility]  01/09/2023, 7:52 AM   *Please note that this is a verbal dictation therefore any spelling or grammatical errors are due to the "Dragon [employer] One" system interpretation.  Please page via Amion and do not message via secure chat for urgent patient care matters. Secure chat can be used for non urgent patient care matters.  How to contact the [facility] Attending or Consulting provider 7A - 7P  or covering provider during after hours 7P -7A, for this patient?  Check the care team in [facility] and look for a) attending/consulting [facility] provider listed and b) the [facility]  team listed. Page or secure chat 7A-7P. Log into [email or web address] and use [place]'s universal password to access. If you do not have the password, please contact the hospital operator. Locate the [facility] provider you are looking for under [facility] and page to a number that you can be directly reached. If you still have difficulty reaching the provider, please page the [facility] (Director on Call) for the Hospitalists listed on amion for assistance.

## 2023-01-09 NOTE — Progress Notes (Signed)
PHARMACY - TOTAL PARENTERAL NUTRITION CONSULT NOTE   Indication: Short bowel syndrome  Patient Measurements: Height: 5\' 4"  (162.6 cm) Weight: 54.9 kg (121 lb 0.5 oz) IBW/kg (Calculated) : 54.7 TPN AdjBW (KG): 57.8 Body mass index is 20.78 kg/m. Usual Weight: 46.3 kg from 8/28  Assessment: 47 y.o. female with PMH of recurrent breast cancer not currently on treatment, Crohn's disease s/p subtotal colectomy in 2021, recurrent obstructions, colonic stricture, recent hospitalization 8/10-8/15/2024 for obstruction who presented on 12/12/2022 with abdominal pain, nausea, vomiting diarrhea.    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 9/26 Line holiday for persistent fevers, elevated WBC 9/30 Ok to place new PICC  Glucose / Insulin: no Hx DM -Steroids PTA for Crohn's disease; weaned off as of 9/13 -CBGs 78-150 while TPN off, nocturnal TF -SSI: 1 unit given in past 24hrs Electrolytes: Na 131, all others WNL including corrCa Renal: WNL Hepatic: LFTs mostly WNL. Albumin low, TG ok I/O: 24 hrs -UOP 1075 mL documented -Ileostomy 1275 mL -Emesis less frequent GI Imaging:  -CTA 9/10 shows new PE, but also abdominal ascites -CT Abd: Large volume ascites GI Surgeries / Procedures:  -OR 9/5 ex lap, internal hernia w/ small bowel ischemia, SBR & celiotomy; > 105 cm small bowel remaining -OR 9/7 for closure, ostomy creation -9/17: Paracentesis -9/23 paracentesis   Central access: new PICC pending Triple lumen CVC R subclavian 9/6, PAC 05/02/22 (REMOVED) TPN start date: 12/17/22 Held 9/26-9/30 for line holiday  Nutritional Goals: TPN provides 94 g protein and 1680 kcal per day  RD  Assessment: Estimated Needs Total Energy Estimated Needs: 1600-1800 Total Protein Estimated Needs: 85-95 grams Total Fluid Estimated Needs: 1.6L/day  Current Nutrition:  Cyclic TPN (18:00-06:00) pending restart, nocturnal TF in interim Diet: DYS 3 with nectar thickened liquids [employer] 1.4 orally BID, each supplement provides 455 kcal and 20 grams protein   Plan: -PICC unable to be placed 9/30, IR consulted 10/1 for placement -Resume cyclic TPN over 12 hr (18:00- 06:00) tonight assuming PICC placed today -Electrolytes in TPN: Na 50 mEq/L, K 50 mEq/L, Ca 5 mEq/L, Mg 5 mEq/L, Phos 15 mmol/L. Cl:Ac ratio 1:1 -Add standard MVI and trace elements to TPN -Replacing vit D via tube; vit A, E, K WNL -Continue CBG checks 4x daily while on cyclic TPN (1-2 hr after completing ramp-up, on TPN, 1 hr after TPN stopped, off TPN) -Monitor TPN labs on Mon/Thurs, will check labs tomorrow morning to ensure electrolytes stable after resuming TPN   Pricilla Riffle, PharmD, BCPS Clinical Pharmacist 01/09/2023 10:38 AM

## 2023-01-09 NOTE — Procedures (Signed)
Vascular and Interventional Radiology Procedure Note  Patient: INIOLUWA BOULAY DOB: [birth year] Medical Record Number: [id number] Note Date/Time: 01/09/23 2:52 PM   Performing Physician: Roanna Banning, MD Assistant(s): None  Diagnosis: TPN access  Procedure: TUNNELED CENTRAL VENOUS "Powerline" CATHETER PLACEMENT  Anesthesia: Local Anesthetic Complications: None Estimated Blood Loss: Minimal Specimens:  None  Findings:  Successful placement of right-sided,  20 cm  (tip-to-cuff), tunneled CVC "Powerline" with the tip of the catheter in the proximal right atrium.  Plan: Catheter ready for use.  See detailed procedure note with images in PACS. The patient tolerated the procedure well without incident or complication and was returned to Recovery in stable condition.    Roanna Banning, MD Vascular and Interventional Radiology Specialists [facility] Radiology   Pager. [phone] Clinic. [phone]

## 2023-01-09 NOTE — Progress Notes (Addendum)
4 Days Post-Op   Subjective/Chief Complaint: NAEO. Denies emesis.  Patient does ask questions today about logistics of home TPN/travel in the future, etc.  Ileostomy 950 cc/24h, improved PICC line unable to be placed by PICC RN so IR has been consulted.  Objective: Vital signs in last 24 hours: Temp:  [98.1 F (36.7 C)-99.1 F (37.3 C)] 99.1 F (37.3 C) (10/01 0357) Pulse Rate:  [112-123] 112 (10/01 0400) Resp:  [14-31] 19 (10/01 0400) BP: (96-120)/(63-89) 98/63 (10/01 0400) SpO2:  [98 %-100 %] 99 % (10/01 0400) Weight:  [54.9 kg] 54.9 kg (10/01 0500) Last BM Date : 01/08/23  Intake/Output from previous day: 09/30 0701 - 10/01 0700 In: 2028.8 [P.O.:120; I.V.:541; NG/GT:720; IV Piggyback:647.8] Out: 1400 [Urine:400; Drains:50; Stool:950] Intake/Output this shift: Total I/O In: -  Out: 925 [Stool:925]  General appearance: alert and cooperative GI: soft, non-tender; bowel sounds normal; no masses,  no organomegaly Midline incision with VAC in place, holding suction.   Lab Results:  Recent Labs    01/07/23 0306 01/08/23 0541  WBC 21.4* 21.6*  HGB 7.9* 7.8*  HCT 25.8* 25.1*  PLT 456* 464*   BMET Recent Labs    01/08/23 0541  NA 137  K 3.3*  CL 102  CO2 26  GLUCOSE 94  BUN 5*  CREATININE 0.86  CALCIUM 8.0*     Studies/Results: [place] EKG SITE RITE  Result Date: 01/08/2023 If Site Rite image not attached, placement could not be confirmed due to current cardiac rhythm.   Anti-infectives: Anti-infectives (From admission, onward)    Start     Dose/Rate Route Frequency Ordered Stop   12/29/22 1215  ciprofloxacin (CIPRO) IVPB 400 mg        400 mg 200 mL/hr over 60 Minutes Intravenous 2 times daily 12/29/22 1123     12/29/22 1215  metroNIDAZOLE (FLAGYL) IVPB 500 mg        500 mg 100 mL/hr over 60 Minutes Intravenous 2 times daily 12/29/22 1123     12/28/22 1430  micafungin (MYCAMINE) 100 mg in sodium chloride 0.9 % 100 mL IVPB  Status:  Discontinued         100 mg 105 mL/hr over 1 Hours Intravenous Every 24 hours 12/28/22 1335 01/05/23 0731   12/23/22 2000  vancomycin (VANCOREADY) IVPB 1250 mg/250 mL  Status:  Discontinued        1,250 mg 166.7 mL/hr over 90 Minutes Intravenous Every 36 hours 12/22/22 0612 12/22/22 1103   12/22/22 0645  vancomycin (VANCOREADY) IVPB 1250 mg/250 mL        1,250 mg 166.7 mL/hr over 90 Minutes Intravenous  Once 12/22/22 0551 12/22/22 0805   12/22/22 0600  piperacillin-tazobactam (ZOSYN) IVPB 3.375 g  Status:  Discontinued        3.375 g 12.5 mL/hr over 240 Minutes Intravenous Every 8 hours 12/22/22 0551 12/29/22 1123   12/13/22 1000  piperacillin-tazobactam (ZOSYN) IVPB 3.375 g  Status:  Discontinued        3.375 g 12.5 mL/hr over 240 Minutes Intravenous Every 8 hours 12/13/22 0272 12/20/22 1333       Assessment/Plan: Hx Crohn's disease s/p subtotal colectomy  Internal hernia with bowel ischemia - POD 26 exploratory laparotomy with small bowel resection 9/5 Dr. Hillery Hunter - POD 24  s/p exploratory laparotomy, ileostomy, abdominal closure 9/7 Dr. 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 Highly likely to have high ostomy output  -POD 3 from PAC removal. Pre-alb  11 and Albumin < 1.5 on 9/19 - Imodium 4mg  QID, Iron 325mg  BID, Fibercon 625mg  TID. lomotil 2 mg TID today. - DYS 3 diet, nocturnal TF, IR Consult for PICC access to resume TPN. - Diet per SLP. DYS3 - poor PO intake at current, PO supplements per dietician. - WOCN following for new ostomy. Plan ostomy clinic referral at d/c - Midline wound with hx of bleeding. Resolved. Cont VAC M/W/F, white foam D/C-ed 9/30.Healing well. - CT A/P 9/16 done. No indication for return to the OR. Low suspicion for bowel ischemia currently. Ascites without signs of infection on para. Lactate 9/25 WNL. Central line removed 9/26. Port removed 9/27. WBC down trending. Fevers resolved.  - ABx and anti-fungals per ID -  PT/OT - Palliative following peripherally currently.    Ongoing fever of unclear etiology. Low suspicion for abdominal source. CXR 9/25 ok, BCx 9/18 NGTD, peritoneal cultures 9/17 and 9/23 NGTD. Repeat BCx 9/25 and 9/26 NGTD   ID - Zosyn 9/4>>9/11. 9/13 >> Micafungin 9/19 >> Patient still febrile with elevated leukocytosis.  LE [place] 9/18 neg. UE [place] 9/19 neg. UA neg. CXR w/ L > R opacities with b/l pleural effusions. Defer to [facility] if pleural effusions need further addressing. Resp Cx ordered. Bcx including specific fungal blood cx NGTD. PAC out 9/27- WBC trending down. ID requesting CT A/P to help with duration of abx. We will follow results of the study,   FEN - DYS 3 per SLP as above, nocturnal TF, resume TPN once she has appropriate central access via PICC. Would not recommend peripheral parenteral nutrition at any point.  VTE - SCDs, Hep gtt Foley - Failed TOV. Foley in place with good UOP.      - per [facility] - PE - Noted on CTA 9/10. LE [place] negative for DVT. Heparin gtt. Hgb 7.5 from 8.6, agree with PM CBC. Agree with CCM that we may need to consider IVC temporary filter at some point if unable to restart heparin gtt (hx breast ca, recent surgery, immobilized). Shock - off pressors VDRF - extubated  Recent cardiac arrest 9/3 AKI - Cr stable on most recent labs Anemia - Transfuse to keep hemoglobin above 7. Consider IV iron supplementation - she has short gut so worried she will not absorb iron well orally. Last PT-INR 9/15 was 1.3.  Hx metastatic breast cancer   LOS: 27 days    [name] 01/09/2023

## 2023-01-09 NOTE — Progress Notes (Signed)
ANTICOAGULATION CONSULT NOTE  Pharmacy Consult for heparin Indication: pulmonary embolus  Allergies  Allergen Reactions   Nsaids Other (See Comments)    Crohns disease/ IBD   Chlorhexidine Gluconate Itching    Able to use CHG swabs for ports, reaction with direct skin contact   Hydromorphone Other (See Comments)    Confusion/sedation   Lactose Intolerance (Gi) Other (See Comments)    Upset stomach/diarrhea    Patient Measurements: Height: 5\' 4"  (162.6 cm) Weight: 54.9 kg (121 lb 0.5 oz) IBW/kg (Calculated) : 54.7 Heparin Dosing Weight: TBW  Vital Signs: Temp: 98.6 F (37 C) (10/01 0805) Temp Source: Oral (10/01 0805) BP: 111/83 (10/01 0900) Pulse Rate: 113 (10/01 0900)  Labs: Recent Labs    01/07/23 0306 01/08/23 0541 01/08/23 1241 01/09/23 0806  HGB 7.9* 7.8*  --   --   HCT 25.8* 25.1*  --   --   PLT 456* 464*  --   --   HEPARINUNFRC 0.62 0.76* 0.63 0.68  CREATININE  --  0.86  --  0.80    Estimated Creatinine Clearance: 76.7 mL/min (by C-G formula based on SCr of 0.8 mg/dL).   Medications:  No AC PTA.  Assessment: 47 yo F with a PMH of recurrent breast cancer not currently on treatment, who presented to the ED on 12/12/2022 with abdominal pain, nausea, vomiting diarrhea.   Patient was placed on heparin drip from 9/10-9/12 for new LLL PE. On 9/12 heparin drip was discontinued due to hemoglobin dropping and intermittent bleeding in ostomy.   On 9/21: Pharmacy has been consulted to resume heparin drip after transfusion (Hgb 7, transfused 1 unit PRBC) On 9/27 heparin held for [facility], resumed w/ no bolus at 13:00  01/09/2023 Heparin level 0.68 - therapeutic with heparin infusion at 1800 units/hr CBC stable No complications of therapy noted   Goal of Therapy:  Heparin level 0.3-0.7 units/ml Monitor platelets by anticoagulation protocol: Yes   Plan:  Continue heparin infusion at 1800 units/hr   Daily CBC & Heparin level Monitor for signs/symptoms of  bleeding Follow up long-term anticoagulation plans    Pricilla Riffle, PharmD, BCPS Clinical Pharmacist 01/09/2023 10:47 AM

## 2023-01-10 ENCOUNTER — Inpatient Hospital Stay (HOSPITAL_COMMUNITY): Payer: Managed Care, Other (non HMO)

## 2023-01-10 DIAGNOSIS — Z9049 Acquired absence of other specified parts of digestive tract: Secondary | ICD-10-CM | POA: Diagnosis not present

## 2023-01-10 DIAGNOSIS — K90829 Short bowel syndrome, unspecified: Secondary | ICD-10-CM | POA: Diagnosis not present

## 2023-01-10 DIAGNOSIS — D500 Iron deficiency anemia secondary to blood loss (chronic): Secondary | ICD-10-CM | POA: Diagnosis not present

## 2023-01-10 DIAGNOSIS — R509 Fever, unspecified: Secondary | ICD-10-CM | POA: Diagnosis not present

## 2023-01-10 LAB — CULTURE, BLOOD (ROUTINE X 2)
Culture: NO GROWTH
Culture: NO GROWTH
Special Requests: ADEQUATE

## 2023-01-10 LAB — GLUCOSE, CAPILLARY
Glucose-Capillary: 102 mg/dL — ABNORMAL HIGH (ref 70–99)
Glucose-Capillary: 113 mg/dL — ABNORMAL HIGH (ref 70–99)
Glucose-Capillary: 148 mg/dL — ABNORMAL HIGH (ref 70–99)
Glucose-Capillary: 87 mg/dL (ref 70–99)

## 2023-01-10 LAB — CBC
HCT: 27.2 % — ABNORMAL LOW (ref 36.0–46.0)
Hemoglobin: 8.3 g/dL — ABNORMAL LOW (ref 12.0–15.0)
MCH: 28.4 pg (ref 26.0–34.0)
MCHC: 30.5 g/dL (ref 30.0–36.0)
MCV: 93.2 fL (ref 80.0–100.0)
Platelets: 458 10*3/uL — ABNORMAL HIGH (ref 150–400)
RBC: 2.92 MIL/uL — ABNORMAL LOW (ref 3.87–5.11)
RDW: 18.1 % — ABNORMAL HIGH (ref 11.5–15.5)
WBC: 22 10*3/uL — ABNORMAL HIGH (ref 4.0–10.5)
nRBC: 0.1 % (ref 0.0–0.2)

## 2023-01-10 LAB — BASIC METABOLIC PANEL
Anion gap: 10 (ref 5–15)
BUN: 14 mg/dL (ref 6–20)
CO2: 23 mmol/L (ref 22–32)
Calcium: 8.2 mg/dL — ABNORMAL LOW (ref 8.9–10.3)
Chloride: 100 mmol/L (ref 98–111)
Creatinine, Ser: 0.85 mg/dL (ref 0.44–1.00)
GFR, Estimated: >60 mL/min (ref >60)
Glucose, Bld: 124 mg/dL — ABNORMAL HIGH (ref 70–99)
Potassium: 4.5 mmol/L (ref 3.5–5.1)
Sodium: 133 mmol/L — ABNORMAL LOW (ref 135–145)

## 2023-01-10 LAB — PHOSPHORUS: Phosphorus: 3.6 mg/dL (ref 2.5–4.6)

## 2023-01-10 LAB — HEPARIN LEVEL (UNFRACTIONATED): Heparin Unfractionated: 0.56 [IU]/mL (ref 0.30–0.70)

## 2023-01-10 LAB — MAGNESIUM: Magnesium: 1.6 mg/dL — ABNORMAL LOW (ref 1.7–2.4)

## 2023-01-10 MED ORDER — DIPHENOXYLATE-ATROPINE 2.5-0.025 MG PO TABS
2.0000 | ORAL_TABLET | Freq: Four times a day (QID) | ORAL | Status: DC
  Administered 2023-01-10 – 2023-01-11 (×7): 2
  Filled 2023-01-10 (×7): qty 2

## 2023-01-10 MED ORDER — TRAVASOL 10 % IV SOLN
INTRAVENOUS | Status: AC
  Filled 2023-01-10: qty 940.8

## 2023-01-10 MED ORDER — MAGNESIUM SULFATE 2 GM/50ML IV SOLN
2.0000 g | Freq: Once | INTRAVENOUS | Status: AC
  Administered 2023-01-10: 2 g via INTRAVENOUS
  Filled 2023-01-10: qty 50

## 2023-01-10 MED ORDER — LACTATED RINGERS IV BOLUS
500.0000 mL | Freq: Once | INTRAVENOUS | Status: AC
  Administered 2023-01-10: 500 mL via INTRAVENOUS

## 2023-01-10 MED ORDER — IOHEXOL 300 MG/ML  SOLN
100.0000 mL | Freq: Once | INTRAMUSCULAR | Status: AC | PRN
  Administered 2023-01-10: 80 mL via INTRAVENOUS

## 2023-01-10 MED ORDER — KATE FARMS STANDARD 1.4 PO LIQD
360.0000 mL | ORAL | Status: DC
  Administered 2023-01-10 – 2023-01-11 (×2): 360 mL
  Filled 2023-01-10 (×3): qty 650

## 2023-01-10 MED ORDER — DIPHENOXYLATE-ATROPINE 2.5-0.025 MG PO TABS: 3.0000 | ORAL_TABLET | Freq: Four times a day (QID) | ORAL | Status: DC

## 2023-01-10 MED ORDER — IOHEXOL 9 MG/ML PO SOLN
500.0000 mL | ORAL | Status: AC
  Administered 2023-01-10 (×2): 500 mL via ORAL

## 2023-01-10 NOTE — Progress Notes (Signed)
OT Cancellation Note  Patient Details Name: [name] MRN: [id number] DOB: 04-23-1975   Cancelled Treatment:    Reason Eval/Treat Not Completed: Pain limiting ability to participate Patient declined reporting too much pain. Checked on x2.  Rosalio Loud, MS Acute Rehabilitation Department Office# [phone]  01/10/2023, 4:00 PM

## 2023-01-10 NOTE — Plan of Care (Signed)
  Problem: Education: Goal: Ability to describe self-care measures that may prevent or decrease complications (Diabetes Survival Skills Education) will improve Outcome: Progressing Goal: Individualized Educational Video(s) Outcome: Progressing   Problem: Coping: Goal: Ability to adjust to condition or change in health will improve Outcome: Progressing   Problem: Fluid Volume: Goal: Ability to maintain a balanced intake and output will improve Outcome: Progressing   Problem: Health Behavior/Discharge Planning: Goal: Ability to identify and utilize available resources and services will improve Outcome: Progressing Goal: Ability to manage health-related needs will improve Outcome: Progressing   Problem: Nutritional: Goal: Maintenance of adequate nutrition will improve Outcome: Progressing Goal: Progress toward achieving an optimal weight will improve Outcome: Progressing   Problem: Tissue Perfusion: Goal: Adequacy of tissue perfusion will improve Outcome: Progressing   Problem: Education: Goal: Knowledge of General Education information will improve Description: Including pain rating scale, medication(s)/side effects and non-pharmacologic comfort measures Outcome: Progressing   Problem: Health Behavior/Discharge Planning: Goal: Ability to manage health-related needs will improve Outcome: Progressing   Problem: Clinical Measurements: Goal: Diagnostic test results will improve Outcome: Progressing Goal: Respiratory complications will improve Outcome: Progressing Goal: Cardiovascular complication will be avoided Outcome: Progressing   Problem: Activity: Goal: Risk for activity intolerance will decrease Outcome: Progressing   Problem: Nutrition: Goal: Adequate nutrition will be maintained Outcome: Progressing   Problem: Coping: Goal: Level of anxiety will decrease Outcome: Progressing   Problem: Elimination: Goal: Will not experience complications related to bowel  motility Outcome: Progressing Goal: Will not experience complications related to urinary retention Outcome: Progressing   Problem: Pain Managment: Goal: General experience of comfort will improve Outcome: Progressing   Problem: Safety: Goal: Ability to remain free from injury will improve Outcome: Progressing   Problem: Skin Integrity: Goal: Risk for impaired skin integrity will decrease Outcome: Progressing   Problem: Fluid Volume: Goal: Hemodynamic stability will improve Outcome: Progressing   Problem: Clinical Measurements: Goal: Diagnostic test results will improve Outcome: Progressing Goal: Signs and symptoms of infection will decrease Outcome: Progressing   Problem: Respiratory: Goal: Ability to maintain adequate ventilation will improve Outcome: Progressing   Problem: Metabolic: Goal: Ability to maintain appropriate glucose levels will improve Outcome: Not Progressing   Problem: Skin Integrity: Goal: Risk for impaired skin integrity will decrease Outcome: Not Progressing   Problem: Clinical Measurements: Goal: Ability to maintain clinical measurements within normal limits will improve Outcome: Not Progressing Goal: Will remain free from infection Outcome: Not Progressing

## 2023-01-10 NOTE — Progress Notes (Signed)
5 Days Post-Op   Subjective/Chief Complaint: NAEO. Got a tunneled CVC placed by IR yesterday  Objective: Vital signs in last 24 hours: Temp:  [98.1 F (36.7 C)-99.7 F (37.6 C)] 98.2 F (36.8 C) (10/02 0740) Pulse Rate:  [112-128] 120 (10/02 0700) Resp:  [17-34] 20 (10/02 0700) BP: (91-162)/(55-95) 105/63 (10/02 0700) SpO2:  [93 %-100 %] 99 % (10/02 0700) Weight:  [51 kg] 51 kg (10/02 0700) Last BM Date : 01/09/23  Intake/Output from previous day: 10/01 0701 - 10/02 0700 In: 3121 [P.O.:120; I.V.:2048.5; NG/GT:375; IV Piggyback:577.5] Out: 2700 [Urine:500; Stool:2200] Intake/Output this shift: Total I/O In: 60 [NG/GT:60] Out: -   General appearance: alert and cooperative GI: soft, non-tender; bowel sounds normal; no masses,  no organomegaly Midline incision with VAC in place, holding suction.   Lab Results:  Recent Labs    01/08/23 0541 01/10/23 0255  WBC 21.6* 22.0*  HGB 7.8* 8.3*  HCT 25.1* 27.2*  PLT 464* 458*   BMET Recent Labs    01/09/23 0806 01/10/23 0255  NA 131* 133*  K 3.9 4.5  CL 98 100  CO2 24 23  GLUCOSE 104* 124*  BUN 6 14  CREATININE 0.80 0.85  CALCIUM 8.3* 8.2*     Studies/Results: IR Fluoro Guide CV Line Right  Result Date: 01/09/2023 INDICATION: History of Crohn disease.  TPN requiring central venous access EXAM: ULTRASOUND AND FLUOROSCOPIC GUIDED PLACEMENT OF TUNNELED CENTRAL VENOUS "Powerline" CATHETER MEDICATIONS: None. ANESTHESIA/SEDATION: Local anesthetic was administered. The patient was continuously monitored during the procedure by the interventional radiology nurse under my direct supervision. FLUOROSCOPY TIME:  Fluoroscopic dose; 4 mGy COMPLICATIONS: None immediate. PROCEDURE: Informed written consent was obtained from the patient and/or patient's representative after a discussion of the risks, benefits, and alternatives to treatment. Questions regarding the procedure were encouraged and answered. The RIGHT neck and chest were  prepped with chlorhexidine in a sterile fashion, and a sterile drape was applied covering the operative field. Maximum barrier sterile technique with sterile gowns and gloves were used for the procedure. A timeout was performed prior to the initiation of the procedure. After the overlying soft tissues were anesthetized, a small venotomy incision was created and a micropuncture kit was utilized to access the internal jugular vein. Real-time ultrasound guidance was utilized for vascular access including the acquisition of a permanent ultrasound image documenting patency of the accessed vessel. The microwire was utilized to measure appropriate catheter length. The micropuncture sheath was exchanged for a peel-away sheath over a guidewire. A 6 Fr dual lumen tunneled central venous catheter measuring 20 cm was tunneled in a retrograde fashion from the anterior chest wall to the venotomy incision. The catheter was then placed through the peel-away sheath with tip ultimately positioned at the superior caval-atrial junction. Final catheter positioning was confirmed and documented with a spot radiographic image. The catheter aspirates and flushes normally. The catheter was flushed with appropriate volume heparin dwells. The catheter exit site was secured with a 2-0 Ethilon retention suture. The venotomy incision was closed with Dermabond. Dressings were applied. The patient tolerated the procedure well without immediate post procedural complication. FINDINGS: After catheter placement, the tip lies within the superior apect of the right atrium. The catheter aspirates and flushes normally and is ready for immediate use. IMPRESSION: Successful placement of a 20 cm dual lumen tunneled central venous "Powerline" catheter via the RIGHT internal jugular vein The tip of the catheter is positioned within the proximal RIGHT atrium. The catheter is ready for immediate  use. Roanna Banning, MD Vascular and Interventional Radiology  Specialists [facility] Radiology Electronically Signed   By: Roanna Banning M.D.   On: 01/09/2023 16:16    Anti-infectives: Anti-infectives (From admission, onward)    Start     Dose/Rate Route Frequency Ordered Stop   12/29/22 1215  ciprofloxacin (CIPRO) IVPB 400 mg        400 mg 200 mL/hr over 60 Minutes Intravenous 2 times daily 12/29/22 1123     12/29/22 1215  metroNIDAZOLE (FLAGYL) IVPB 500 mg        500 mg 100 mL/hr over 60 Minutes Intravenous 2 times daily 12/29/22 1123     12/28/22 1430  micafungin (MYCAMINE) 100 mg in sodium chloride 0.9 % 100 mL IVPB  Status:  Discontinued        100 mg 105 mL/hr over 1 Hours Intravenous Every 24 hours 12/28/22 1335 01/05/23 0731   12/23/22 2000  vancomycin (VANCOREADY) IVPB 1250 mg/250 mL  Status:  Discontinued        1,250 mg 166.7 mL/hr over 90 Minutes Intravenous Every 36 hours 12/22/22 0612 12/22/22 1103   12/22/22 0645  vancomycin (VANCOREADY) IVPB 1250 mg/250 mL        1,250 mg 166.7 mL/hr over 90 Minutes Intravenous  Once 12/22/22 0551 12/22/22 0805   12/22/22 0600  piperacillin-tazobactam (ZOSYN) IVPB 3.375 g  Status:  Discontinued        3.375 g 12.5 mL/hr over 240 Minutes Intravenous Every 8 hours 12/22/22 0551 12/29/22 1123   12/13/22 1000  piperacillin-tazobactam (ZOSYN) IVPB 3.375 g  Status:  Discontinued        3.375 g 12.5 mL/hr over 240 Minutes Intravenous Every 8 hours 12/13/22 4098 12/20/22 1333       Assessment/Plan: Hx Crohn's disease s/p subtotal colectomy  Internal hernia with bowel ischemia - POD 27 exploratory laparotomy with small bowel resection 9/5 Dr. Hillery Hunter - POD 25  s/p exploratory laparotomy, ileostomy, abdominal closure 9/7 Dr. 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 Highly likely to have high ostomy output  - s/p PAC removal 9/27. Pre-alb 11 and Albumin < 1.5 on 9/19 - Imodium 4mg  QID, Iron 325mg  BID, Fibercon 625mg  TID. lomotil 2 mg QID  today. - DYS 3 diet, nocturnal TF, new CVC placed by IR 10/1 >> resume TPN - Diet per SLP. DYS3 - poor PO intake at current, PO supplements per dietician. - WOCN following for new ostomy. Plan ostomy clinic referral at d/c - Midline wound with hx of bleeding. Resolved. Cont VAC M/W/F, white foam D/C-ed 9/30.Healing well. - CT A/P 9/16 done. No indication for return to the OR. Low suspicion for bowel ischemia currently. Ascites without signs of infection on para. Lactate 9/25 WNL. Central line removed 9/26. Port removed 9/27. WBC down trending. Fevers resolved. ID planning CT abdomen today, will follow these results. - ABx and anti-fungals per ID - PT/OT - Palliative following peripherally currently.     ID - Zosyn 9/4>>9/11. 9/13 >> Micafungin 9/19 >> fevers resolved since port removal.  LE [place] 9/18 neg. UE [place] 9/19 neg. UA neg. CXR w/ L > R opacities with b/l pleural effusions. Resp Cx NGTD. Bcx including specific fungal blood cx NGTD. PAC out 9/27- WBC trending down.    FEN - DYS 3 per SLP as above, nocturnal TF, TPN VTE - SCDs, Hep gtt Foley - Failed TOV. Foley in place with good UOP.      -  per [facility] - PE - Noted on CTA 9/10. LE [place] negative for DVT. Heparin gtt. Hgb 7.5 from 8.6, agree with PM CBC. Agree with CCM that we may need to consider IVC temporary filter at some point if unable to restart heparin gtt (hx breast ca, recent surgery, immobilized). Shock - off pressors VDRF - extubated  Recent cardiac arrest 9/3 AKI - Cr stable on most recent labs Anemia - Transfuse to keep hemoglobin above 7. Consider IV iron supplementation - she has short gut so worried she will not absorb iron well orally. Last PT-INR 9/15 was 1.3.  Hx metastatic breast cancer   LOS: 28 days    Adam Phenix 01/10/2023

## 2023-01-10 NOTE — Progress Notes (Signed)
ANTICOAGULATION CONSULT NOTE  Pharmacy Consult for heparin Indication: pulmonary embolus  Allergies  Allergen Reactions   Nsaids Other (See Comments)    Crohns disease/ IBD   Chlorhexidine Gluconate Itching    Able to use CHG swabs for ports, reaction with direct skin contact   Hydromorphone Other (See Comments)    Confusion/sedation   Lactose Intolerance (Gi) Other (See Comments)    Upset stomach/diarrhea    Patient Measurements: Height: 5\' 4"  (162.6 cm) Weight: 54.9 kg (121 lb 0.5 oz) IBW/kg (Calculated) : 54.7 Heparin Dosing Weight: TBW  Vital Signs: Temp: 99.7 F (37.6 C) (10/02 0445) Temp Source: Oral (10/02 0445) BP: 105/63 (10/02 0700) Pulse Rate: 120 (10/02 0700)  Labs: Recent Labs    01/08/23 0541 01/08/23 1241 01/09/23 0806 01/10/23 0255  HGB 7.8*  --   --  8.3*  HCT 25.1*  --   --  27.2*  PLT 464*  --   --  458*  HEPARINUNFRC 0.76* 0.63 0.68 0.56  CREATININE 0.86  --  0.80 0.85    Estimated Creatinine Clearance: 72.2 mL/min (by C-G formula based on SCr of 0.85 mg/dL).   Medications:  No AC PTA.  Assessment: 47 yo F with a PMH of recurrent breast cancer not currently on treatment, who presented to the ED on 12/12/2022 with abdominal pain, nausea, vomiting diarrhea.   Patient was placed on heparin drip from 9/10-9/12 for new LLL PE. On 9/12 heparin drip was discontinued due to hemoglobin dropping and intermittent bleeding in ostomy.   On 9/21: Pharmacy has been consulted to resume heparin drip after transfusion (Hgb 7, transfused 1 unit PRBC) On 9/27 heparin held for [facility], resumed w/ no bolus at 13:00  01/10/2023 Heparin level 0.56 - therapeutic with heparin infusion at 1800 units/hr CBC stable No complications of therapy noted   Goal of Therapy:  Heparin level 0.3-0.7 units/ml Monitor platelets by anticoagulation protocol: Yes   Plan:  Continue heparin infusion at 1800 units/hr   Daily CBC & Heparin level Monitor for signs/symptoms of  bleeding Follow up long-term anticoagulation plans    Pricilla Riffle, PharmD, BCPS Clinical Pharmacist 01/10/2023 7:46 AM

## 2023-01-10 NOTE — Progress Notes (Signed)
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 Active Problems:   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   Malignant neoplasm of upper-outer quadrant of right breast in female, estrogen receptor positive (HCC)   Hypokalemia   Port-A-Cath in place   Breast cancer (HCC)   Crohn's disease of both small and large intestine with intestinal obstruction (HCC)   Leukocytosis   Chronic disease anemia   Protein-calorie malnutrition, severe (HCC)   Failure to thrive in adult   Anorexia   Cardiac arrest (HCC)   Septic shock (HCC)   SBO with strangulation s/p resection 12/14/2022   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  Underwent ex lap on 9/5 for bowel resection due to bowel ischemia # Ex lap, ileostomy and abdominal closure on 9/7 - She developed shortness and high output ostomy.  Continued fever and leukocytosis. - CT 9/16 showed large amount of ascites.  She had been on vancomycin and Zosyn at that point ID was engaged.  Micafungin was added on 9/17 for possible fungemia. - Patient has continued to feed her on ciprofloxacin, metronidazole and micafungin.  Blood and fungal cultures have been no growth so far form 9/18 9/7 paracentesis with no growth. - On 9/22 CT abdomen pelvis there is concern for early developing obstruction/peritonitis.  Repeat paracentesis on 9/23.  General surgery engaged and noted that  abdominal exam is not impressive, may need repeat blood cultures source could be lines or urine.  Repeat scan if no other source Thursday or Friday. - Patient does not have urinary symptoms.  As previously noted blood cultures and fungal cultures from 9/18 are with no growth. -As pt continues to fever would recommend all lines to remove any sources. Of note I suspect a developing obstruction/peritonitis may be contributing to fever and leukocytosis as well.  Recommendations: -PICC line was removed on 9/26, repeat blood cultures obtained - Port was removed on 9/27 with general surgery. 72 hr line holiday and CVC placed on 10/1 - Blood Cx remain negative - CT AP today to decide on abx duration. Continue cipr+ metro for now   Microbiology:   Antibiotics: Ciprofloxacin, metronidazole, micafungin Cultures: Blood 9/18 fungal blood cultures no growth 9/26 Bcx NG 9/30 Bcx NG  SUBJECTIVE: Resting in bed.  No new complaints. Interval: Afebrile overnight, WBC 22K Review of Systems: Review of Systems  All other systems reviewed and are negative.    Scheduled Meds:  ALPRAZolam  0.25 mg Oral BID   ascorbic acid  500 mg Per Tube BID   diphenoxylate-atropine  2 tablet Per Tube QID   famotidine  20 mg Per Tube Daily   feeding supplement (KATE FARMS STANDARD 1.4)  325 mL Oral BID BM   feeding supplement (KATE FARMS STANDARD 1.4)  360 mL Per Tube Q24H   ferrous sulfate  300 mg Per Tube BID   fiber  1 packet Per Tube TID  gabapentin  200 mg Per Tube Q8H   insulin aspart  0-9 Units Subcutaneous 4 times per day   loperamide HCl  4 mg Per Tube QID   metoprolol tartrate  50 mg Per Tube BID   mouth rinse  15 mL Mouth Rinse 4 times per day   sodium chloride flush  10-40 mL Intracatheter Q12H   Vitamin D (Ergocalciferol)  50,000 Units Oral Q7 days   zinc sulfate  220 mg Per Tube Daily   Continuous Infusions:  ciprofloxacin Stopped (01/10/23 1212)   heparin 1,800 Units/hr (01/10/23 1106)    metronidazole 500 mg (01/10/23 1213)   TPN CYCLIC-ADULT (ION)     PRN Meds:.acetaminophen, albuterol, artificial tears, fentaNYL (SUBLIMAZE) injection, food thickener, labetalol, lip balm, LORazepam, [DISCONTINUED] ondansetron **OR** ondansetron (ZOFRAN) IV, mouth rinse, mouth rinse, mouth rinse, mouth rinse, oxyCODONE, silver nitrate applicators, sodium chloride flush Allergies  Allergen Reactions   Nsaids Other (See Comments)    Crohns disease/ IBD   Chlorhexidine Gluconate Itching    Able to use CHG swabs for ports, reaction with direct skin contact   Hydromorphone Other (See Comments)    Confusion/sedation   Lactose Intolerance (Gi) Other (See Comments)    Upset stomach/diarrhea    OBJECTIVE: Vitals:   01/10/23 0600 01/10/23 0700 01/10/23 0740 01/10/23 1200  BP: 93/63 105/63    Pulse: (!) 124 (!) 120    Resp: 20 20    Temp:   98.2 F (36.8 C) 98 F (36.7 C)  TempSrc:   Oral Oral  SpO2: 99% 99%    Weight:  51 kg    Height:       Body mass index is 19.3 kg/m.  Physical Exam Constitutional:      Appearance: Normal appearance.  HENT:     Head: Normocephalic and atraumatic.     Right Ear: Tympanic membrane normal.     Left Ear: Tympanic membrane normal.     Nose: Nose normal.     Mouth/Throat:     Mouth: Mucous membranes are moist.  Eyes:     Extraocular Movements: Extraocular movements intact.     Conjunctiva/sclera: Conjunctivae normal.     Pupils: Pupils are equal, round, and reactive to light.  Cardiovascular:     Rate and Rhythm: Normal rate and regular rhythm.     Heart sounds: No murmur heard.    No friction rub. No gallop.     Comments: Port out Pulmonary:     Effort: Pulmonary effort is normal.     Breath sounds: Normal breath sounds.  Abdominal:     Comments: Abdominal wound  Musculoskeletal:        General: Normal range of motion.  Skin:    General: Skin is warm and dry.  Neurological:     General: No focal deficit present.     Mental Status:  She is alert and oriented to person, place, and time.  Psychiatric:        Mood and Affect: Mood normal.       Lab Results Lab Results  Component Value Date   WBC 22.0 (H) 01/10/2023   HGB 8.3 (L) 01/10/2023   HCT 27.2 (L) 01/10/2023   MCV 93.2 01/10/2023   PLT 458 (H) 01/10/2023    Lab Results  Component Value Date   CREATININE 0.85 01/10/2023   BUN 14 01/10/2023   NA 133 (L) 01/10/2023   K 4.5 01/10/2023   CL 100 01/10/2023   CO2 23 01/10/2023  Lab Results  Component Value Date   ALT 32 01/08/2023   AST 52 (H) 01/08/2023   ALKPHOS 156 (H) 01/08/2023   BILITOT 0.5 01/08/2023        Danelle Earthly, MD [facility] 01/10/2023, 1:24 PM I have personally spent 50 minutes involved in face-to-face and non-face-to-face activities for this patient on the day of the visit. Professional time spent includes the following activities: Preparing to see the patient (review of tests), Obtaining and/or reviewing separately obtained history (admission/discharge record), Performing a medically appropriate examination and/or evaluation , Ordering medications/tests/procedures, referring and communicating with other health care professionals, Documenting clinical information in the EMR, Independently interpreting results (not separately reported), Communicating results to the patient/family/caregiver, Counseling and educating the patient/family/caregiver and Care coordination (not separately reported).

## 2023-01-10 NOTE — Progress Notes (Signed)
PHARMACY - TOTAL PARENTERAL NUTRITION CONSULT NOTE   Indication: Short bowel syndrome  Patient Measurements: Height: 5\' 4"  (162.6 cm) Weight: 54.9 kg (121 lb 0.5 oz) IBW/kg (Calculated) : 54.7 TPN AdjBW (KG): 57.8 Body mass index is 20.78 kg/m. Usual Weight: 46.3 kg from 8/28  Assessment: 46 y.o. female with PMH of recurrent breast cancer not currently on treatment, Crohn's disease s/p subtotal colectomy in 2021, recurrent obstructions, colonic stricture, recent hospitalization 8/10-8/15/2024 for obstruction who presented on 12/12/2022 with abdominal pain, nausea, vomiting, diarrhea.    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 9/26 Line holiday for persistent fevers, elevated WBC 9/30 Ok to place new PICC, 10/1 line placed and TPN resumed  Glucose / Insulin: no Hx DM -Steroids PTA for Crohn's disease; weaned off as of 9/13 -CBGs 92-166 -SSI: 3 unit given in past 24hrs Electrolytes: Na 133, Mg 1.6, all others WNL including corrCa Renal: WNL Hepatic: LFTs mostly WNL. Albumin low, TG ok I/O: 24 hrs -UOP none documented previous 24 hrs -Ileostomy 1850 mL GI Imaging:  -CTA 9/10 shows new PE, but also abdominal ascites -CT Abd: Large volume ascites GI Surgeries / Procedures:  -OR 9/5 ex lap, internal hernia w/ small bowel ischemia, SBR & celiotomy; > 105 cm small bowel remaining -OR 9/7 for closure, ostomy creation -9/17: Paracentesis -9/23 paracentesis   Central access: powerline 10/1 Triple lumen CVC R subclavian 9/6, PAC 05/02/22 (REMOVED) TPN start date: 12/17/22 Held 9/26-9/30 for line holiday Resumed 10/1  Nutritional Goals: TPN provides 94 g protein and 1680 kcal per  day  RD Assessment: Estimated Needs Total Energy Estimated Needs: 1600-1800 Total Protein Estimated Needs: 85-95 grams Total Fluid Estimated Needs: 1.6L/day  Current Nutrition:  Cyclic TPN (18:00-06:00) Diet: DYS 3 with nectar thickened liquids [employer] 1.4 orally BID, each supplement provides 455 kcal and 20 grams protein   Plan: -Mg 2 g IV x 1 -Continue cyclic TPN over 12 hr (18:00- 06:00) -Electrolytes in TPN: Na 50 mEq/L, K 50 mEq/L, Ca 5 mEq/L, Mg 7 mEq/L, Phos 15 mmol/L. Cl:Ac ratio 1:1 -Add standard MVI and trace elements to TPN -Replacing vit D via tube; vit A, E, K WNL -Continue CBG checks 4x daily while on cyclic TPN (1-2 hr after completing ramp-up, on TPN, 1 hr after TPN stopped, off TPN) -Monitor TPN labs on Mon/Thurs   Pricilla Riffle, PharmD, BCPS Clinical Pharmacist 01/10/2023 8:01 AM

## 2023-01-10 NOTE — Progress Notes (Signed)
[facility]                                                                               Wildred Beitler, is a 47 y.o. female, DOB - 1976/02/01, WUJ:[id number] Admit date - 12/12/2022    Outpatient Primary MD for the patient is Pahwani, Kasandra Knudsen, MD  LOS - 28  days    Brief summary   ODELL BROCCOLI is a 47 y.o. female with PMH significant for Crohn's disease with previous bowel obstruction s/p subtotal colectomy 2021, colonic stricture, IDA, breast cancer s/p mastectomy 2022 currently not on treatment. Recently hospitalized 8/10-8/15/2024 for bowel obstruction seen by GI and general surgery. Underwent EGD and flexible sigmoidoscopy and discharged on steroids.   9/3, patient presented to the ED with abdominal distention, pain, nausea, vomiting CT abd/pelvis showed markedly dilated distal small bowel consistent with chronic obstruction. No changes since prior study.  Patient was admitted to [facility] with a plan of GI consultation.   9/4, in the morning, within few hours of admission, patient was found pulseless on the floor in the bathroom in ED, estimated downtime about 5 minutes. ACLS was initiated. Concern for possible torsades de pointes and given IV mag, defibrillated.  ROSC achieved in about 5 minutes. She was intubated. She had copious vomitus in the ETT.  Notably, she had I-stat peri-code which showed hemoglobin of 5.5 down from 10 overnight.  Admitted to ICU In the ICU, patient had right femoral CVC placed.  Given 3 minutes of PRBC, 3 units of FFP.  Patient was started on vasopressors   9/5, underwent exploratory laparotomy.  Noted to have internal hernia with associated small bowel ischemia.  Underwent bowel resection. 9/7, back to OR for ostomy placement 9/8, weaned off Levophed 9/10, extubated 9/10, found to have PE in CT angio chest.  Started on heparin drip which was subsequently stopped the next day because of drop in hemoglobin 9/12, mental status improved,  became more interactive.  However failed swallow eval. 9/16, transferred out to [facility] 9/16, CT abdomen showed peritoneal fluid collection 9/17, underwent paracentesis, 500 cc of clear yellow fluid drained 9/22, CT abdomen raised concern of early developing obstruction, peritonitis.  9/23, paracentesis by IR.  Drained 90 cc of turbid, amber/blood-tinged fluid   Patient's hospitalization has been prolonged because of persistent fever, persistent high output from ostomy, anemia requiring blood transfusion, dysphagia.  Assessment & Plan    Assessment and Plan:   Internal hernia with bowel ischemia/prior subtotal colectomy for Crohn's disease/short gut syndrome S/p ex lap  with small bowel resection on 9/5 by Dr. Hillery Hunter s/p ex lap, ileostomy, abdominal closure 9/7 Dr. Hillery Hunter Currently only has 100 cm of small bowel left , short gut syndrome TPN dependent.  To slow down ostomy output, patient is currently on Imodium, iron, FiberCon, Imodium.  Surgery following and managing.   Intermittent fever/sepsis ID on board and a repeat CT abdomen pelvis ordered for further evaluation. PICC line removed on 9/26 and repeat blood cultures have been negative so far. Port-A-Cath was removed on 9/27 and a tunneled central venous catheter placed on 01/09/2023 Patient currently on ciprofloxacin and metronidazole for now  Dysphagia  Postextubation, patient failed speech therapy reevaluation multiple amps.  Gradually improving and currently on dysphagia 3 diet, tube feeding during the day and TPN during the night   Persistent sinus tachycardia: ? Deconditioning, vs from PE.  Asymptomatic. Pain controlled.  Echo showed LVEF of 60 to 65%, Left ventricle has normal function. Right ventricular systolic function is normal.  On metoprolol 50 mg BID.     Acute pulmonary embolism on the left lower lobe Patient is on IV heparin restarted on 12/30/2022   Anemia of acute illness superimposed on anemia of chronic  disease/anemia of from blood loss postop S/p PRBC transfusions. Hemoglobin around 8.3 and stable, transfuse to keep it greater than 7    Hypomagnesemia Replaced   Mild hyponatremia Asymptomatic   Recurrent breast cancer Follow-up with oncology Dr. Pamelia Hoit  Malnutrition Type:  Nutrition Problem: Moderate Malnutrition Etiology: chronic illness (Crohn's disease; metastatic breast cancer)   Malnutrition Characteristics:  Signs/Symptoms: moderate fat depletion, moderate muscle depletion, percent weight loss (24% in 4.5 months) Percent weight loss: 24 % (in 4.5 months)   Nutrition Interventions:  Interventions: Tube feeding, TPN  Estimated body mass index is 19.3 kg/m as calculated from the following:   Height as of this encounter: 5\' 4"  (1.626 m).   Weight as of this encounter: 51 kg.  Code Status: full code.  DVT Prophylaxis:  Place and maintain sequential compression device Start: 12/15/22 1031 SCDs Start: 12/13/22 0413   Level of Care: Level of care: Progressive Family Communication: none at bedside.   Disposition Plan:     Remains inpatient appropriate:  TPN  Procedures:  S/p ex lap  with small bowel resection on 9/5 by Dr. Hillery Hunter s/p ex lap, ileostomy, abdominal closure 9/7 Dr. Marcos Eke venous tunneled catheter on 10/1  Consultants:   Gen surgery ID.   Antimicrobials:   Anti-infectives (From admission, onward)    Start     Dose/Rate Route Frequency Ordered Stop   12/29/22 1215  ciprofloxacin (CIPRO) IVPB 400 mg        400 mg 200 mL/hr over 60 Minutes Intravenous 2 times daily 12/29/22 1123     12/29/22 1215  metroNIDAZOLE (FLAGYL) IVPB 500 mg        500 mg 100 mL/hr over 60 Minutes Intravenous 2 times daily 12/29/22 1123     12/28/22 1430  micafungin (MYCAMINE) 100 mg in sodium chloride 0.9 % 100 mL IVPB  Status:  Discontinued        100 mg 105 mL/hr over 1 Hours Intravenous Every 24 hours 12/28/22 1335 01/05/23 0731   12/23/22 2000   vancomycin (VANCOREADY) IVPB 1250 mg/250 mL  Status:  Discontinued        1,250 mg 166.7 mL/hr over 90 Minutes Intravenous Every 36 hours 12/22/22 0612 12/22/22 1103   12/22/22 0645  vancomycin (VANCOREADY) IVPB 1250 mg/250 mL        1,250 mg 166.7 mL/hr over 90 Minutes Intravenous  Once 12/22/22 0551 12/22/22 0805   12/22/22 0600  piperacillin-tazobactam (ZOSYN) IVPB 3.375 g  Status:  Discontinued        3.375 g 12.5 mL/hr over 240 Minutes Intravenous Every 8 hours 12/22/22 0551 12/29/22 1123   12/13/22 1000  piperacillin-tazobactam (ZOSYN) IVPB 3.375 g  Status:  Discontinued        3.375 g 12.5 mL/hr over 240 Minutes Intravenous Every 8 hours 12/13/22 0833 12/20/22 1333        Medications  Scheduled Meds:  ALPRAZolam  0.25 mg Oral BID  ascorbic acid  500 mg Per Tube BID   diphenoxylate-atropine  2 tablet Per Tube QID   famotidine  20 mg Per Tube Daily   feeding supplement (KATE FARMS STANDARD 1.4)  325 mL Oral BID BM   feeding supplement (KATE FARMS STANDARD 1.4)  360 mL Per Tube Q24H   ferrous sulfate  300 mg Per Tube BID   fiber  1 packet Per Tube TID   gabapentin  200 mg Per Tube Q8H   insulin aspart  0-9 Units Subcutaneous 4 times per day   loperamide HCl  4 mg Per Tube QID   metoprolol tartrate  50 mg Per Tube BID   mouth rinse  15 mL Mouth Rinse 4 times per day   sodium chloride flush  10-40 mL Intracatheter Q12H   Vitamin D (Ergocalciferol)  50,000 Units Oral Q7 days   zinc sulfate  220 mg Per Tube Daily   Continuous Infusions:  ciprofloxacin Stopped (01/10/23 1212)   heparin 1,800 Units/hr (01/10/23 1106)   metronidazole 500 mg (01/10/23 1213)   TPN CYCLIC-ADULT (ION)     PRN Meds:.acetaminophen, albuterol, artificial tears, fentaNYL (SUBLIMAZE) injection, food thickener, labetalol, lip balm, LORazepam, [DISCONTINUED] ondansetron **OR** ondansetron (ZOFRAN) IV, mouth rinse, mouth rinse, mouth rinse, mouth rinse, oxyCODONE, silver nitrate applicators, sodium  chloride flush    Subjective:   Escarlett Stege was seen and examined today.  No new complaints. Wants the panda tube out.   Objective:   Vitals:   01/10/23 0600 01/10/23 0700 01/10/23 0740 01/10/23 1200  BP: 93/63 105/63    Pulse: (!) 124 (!) 120    Resp: 20 20    Temp:   98.2 F (36.8 C) 98 F (36.7 C)  TempSrc:   Oral Oral  SpO2: 99% 99%    Weight:  51 kg    Height:        Intake/Output Summary (Last 24 hours) at 01/10/2023 1242 Last data filed at 01/10/2023 0758 Gross per 24 hour  Intake 2498.78 ml  Output 1775 ml  Net 723.78 ml   Filed Weights   01/08/23 0500 01/09/23 0500 01/10/23 0700  Weight: 56.5 kg 54.9 kg 51 kg     Exam General: Alert and oriented x 3, NAD Cardiovascular: S1 S2 auscultated, no murmurs, RRR Respiratory: Clear to auscultation bilaterally, no wheezing, rales or rhonchi Gastrointestinal: Soft, nontender, nondistended, + bowel sounds Ext: no pedal edema bilaterally Neuro: AAOx3, Cr N's II- XII. Strength 5/5 upper and lower extremities bilaterally Skin: No rashes Psych: Normal affect and demeanor, alert and oriented x3    Data Reviewed:  I have personally reviewed following labs and imaging studies   CBC Lab Results  Component Value Date   WBC 22.0 (H) 01/10/2023   RBC 2.92 (L) 01/10/2023   HGB 8.3 (L) 01/10/2023   HCT 27.2 (L) 01/10/2023   MCV 93.2 01/10/2023   MCH 28.4 01/10/2023   PLT 458 (H) 01/10/2023   MCHC 30.5 01/10/2023   RDW 18.1 (H) 01/10/2023   LYMPHSABS 0.8 12/19/2022   MONOABS 0.3 12/19/2022   EOSABS 0.0 12/19/2022   BASOSABS 0.1 12/19/2022     Last metabolic panel Lab Results  Component Value Date   NA 133 (L) 01/10/2023   K 4.5 01/10/2023   CL 100 01/10/2023   CO2 23 01/10/2023   BUN 14 01/10/2023   CREATININE 0.85 01/10/2023   GLUCOSE 124 (H) 01/10/2023   GFRNONAA >60 01/10/2023   GFRAA 108 01/21/2020   CALCIUM 8.2 (L)  01/10/2023   PHOS 3.6 01/10/2023   PROT 6.5 01/08/2023   ALBUMIN 1.8 (L)  01/08/2023   BILITOT 0.5 01/08/2023   ALKPHOS 156 (H) 01/08/2023   AST 52 (H) 01/08/2023   ALT 32 01/08/2023   ANIONGAP 10 01/10/2023    CBG (last 3)  Recent Labs    01/10/23 0024 01/10/23 0750 01/10/23 1206  GLUCAP 148* 102* 113*      Coagulation Profile: No results for input(s): "INR", "PROTIME" in the last 168 hours.   Radiology Studies: IR Fluoro Guide CV Line Right  Result Date: 01/09/2023 INDICATION: History of Crohn disease.  TPN requiring central venous access EXAM: ULTRASOUND AND FLUOROSCOPIC GUIDED PLACEMENT OF TUNNELED CENTRAL VENOUS "Powerline" CATHETER MEDICATIONS: None. ANESTHESIA/SEDATION: Local anesthetic was administered. The patient was continuously monitored during the procedure by the interventional radiology nurse under my direct supervision. FLUOROSCOPY TIME:  Fluoroscopic dose; 4 mGy COMPLICATIONS: None immediate. PROCEDURE: Informed written consent was obtained from the patient and/or patient's representative after a discussion of the risks, benefits, and alternatives to treatment. Questions regarding the procedure were encouraged and answered. The RIGHT neck and chest were prepped with chlorhexidine in a sterile fashion, and a sterile drape was applied covering the operative field. Maximum barrier sterile technique with sterile gowns and gloves were used for the procedure. A timeout was performed prior to the initiation of the procedure. After the overlying soft tissues were anesthetized, a small venotomy incision was created and a micropuncture kit was utilized to access the internal jugular vein. Real-time ultrasound guidance was utilized for vascular access including the acquisition of a permanent ultrasound image documenting patency of the accessed vessel. The microwire was utilized to measure appropriate catheter length. The micropuncture sheath was exchanged for a peel-away sheath over a guidewire. A 6 Fr dual lumen tunneled central venous catheter measuring 20  cm was tunneled in a retrograde fashion from the anterior chest wall to the venotomy incision. The catheter was then placed through the peel-away sheath with tip ultimately positioned at the superior caval-atrial junction. Final catheter positioning was confirmed and documented with a spot radiographic image. The catheter aspirates and flushes normally. The catheter was flushed with appropriate volume heparin dwells. The catheter exit site was secured with a 2-0 Ethilon retention suture. The venotomy incision was closed with Dermabond. Dressings were applied. The patient tolerated the procedure well without immediate post procedural complication. FINDINGS: After catheter placement, the tip lies within the superior apect of the right atrium. The catheter aspirates and flushes normally and is ready for immediate use. IMPRESSION: Successful placement of a 20 cm dual lumen tunneled central venous "Powerline" catheter via the RIGHT internal jugular vein The tip of the catheter is positioned within the proximal RIGHT atrium. The catheter is ready for immediate use. Roanna Banning, MD [facility] Radiology Electronically Signed   By: Roanna Banning M.D.   On: 01/09/2023 16:16       Kathlen Mody M.D. [facility] 01/10/2023, 12:42 PM  Available via Epic secure chat 7am-7pm After 7 pm, please refer to night coverage provider listed on amion.

## 2023-01-11 ENCOUNTER — Telehealth: Payer: Self-pay | Admitting: *Deleted

## 2023-01-11 ENCOUNTER — Inpatient Hospital Stay (HOSPITAL_COMMUNITY): Payer: Managed Care, Other (non HMO)

## 2023-01-11 DIAGNOSIS — K90829 Short bowel syndrome, unspecified: Secondary | ICD-10-CM | POA: Diagnosis not present

## 2023-01-11 DIAGNOSIS — Z9049 Acquired absence of other specified parts of digestive tract: Secondary | ICD-10-CM | POA: Diagnosis not present

## 2023-01-11 DIAGNOSIS — R509 Fever, unspecified: Secondary | ICD-10-CM | POA: Diagnosis not present

## 2023-01-11 DIAGNOSIS — D500 Iron deficiency anemia secondary to blood loss (chronic): Secondary | ICD-10-CM | POA: Diagnosis not present

## 2023-01-11 LAB — PREALBUMIN: Prealbumin: 13 mg/dL — ABNORMAL LOW (ref 18–38)

## 2023-01-11 LAB — CBC
HCT: 25.5 % — ABNORMAL LOW (ref 36.0–46.0)
Hemoglobin: 7.7 g/dL — ABNORMAL LOW (ref 12.0–15.0)
MCH: 27.2 pg (ref 26.0–34.0)
MCHC: 30.2 g/dL (ref 30.0–36.0)
MCV: 90.1 fL (ref 80.0–100.0)
Platelets: 425 10*3/uL — ABNORMAL HIGH (ref 150–400)
RBC: 2.83 MIL/uL — ABNORMAL LOW (ref 3.87–5.11)
RDW: 18.2 % — ABNORMAL HIGH (ref 11.5–15.5)
WBC: 21.6 10*3/uL — ABNORMAL HIGH (ref 4.0–10.5)
nRBC: 0 % (ref 0.0–0.2)

## 2023-01-11 LAB — COMPREHENSIVE METABOLIC PANEL
ALT: 22 U/L (ref 0–44)
AST: 32 U/L (ref 15–41)
Albumin: 2 g/dL — ABNORMAL LOW (ref 3.5–5.0)
Alkaline Phosphatase: 122 U/L (ref 38–126)
Anion gap: 9 (ref 5–15)
BUN: 17 mg/dL (ref 6–20)
CO2: 23 mmol/L (ref 22–32)
Calcium: 8.5 mg/dL — ABNORMAL LOW (ref 8.9–10.3)
Chloride: 99 mmol/L (ref 98–111)
Creatinine, Ser: 0.76 mg/dL (ref 0.44–1.00)
GFR, Estimated: >60 mL/min (ref >60)
Glucose, Bld: 152 mg/dL — ABNORMAL HIGH (ref 70–99)
Potassium: 3.9 mmol/L (ref 3.5–5.1)
Sodium: 131 mmol/L — ABNORMAL LOW (ref 135–145)
Total Bilirubin: 0.2 mg/dL — ABNORMAL LOW (ref 0.3–1.2)
Total Protein: 6.9 g/dL (ref 6.5–8.1)

## 2023-01-11 LAB — HEPARIN LEVEL (UNFRACTIONATED): Heparin Unfractionated: 0.55 [IU]/mL (ref 0.30–0.70)

## 2023-01-11 LAB — MAGNESIUM: Magnesium: 1.8 mg/dL (ref 1.7–2.4)

## 2023-01-11 LAB — GLUCOSE, CAPILLARY
Glucose-Capillary: 112 mg/dL — ABNORMAL HIGH (ref 70–99)
Glucose-Capillary: 137 mg/dL — ABNORMAL HIGH (ref 70–99)
Glucose-Capillary: 171 mg/dL — ABNORMAL HIGH (ref 70–99)
Glucose-Capillary: 173 mg/dL — ABNORMAL HIGH (ref 70–99)
Glucose-Capillary: 90 mg/dL (ref 70–99)

## 2023-01-11 LAB — PROTIME-INR
INR: 1.2 (ref 0.8–1.2)
Prothrombin Time: 15.8 s — ABNORMAL HIGH (ref 11.4–15.2)

## 2023-01-11 LAB — PHOSPHORUS: Phosphorus: 3.6 mg/dL (ref 2.5–4.6)

## 2023-01-11 MED ORDER — FENTANYL CITRATE (PF) 100 MCG/2ML IJ SOLN
INTRAMUSCULAR | Status: AC
  Filled 2023-01-11: qty 2

## 2023-01-11 MED ORDER — SODIUM CHLORIDE 0.9 % IV SOLN
12.5000 mg | Freq: Four times a day (QID) | INTRAVENOUS | Status: DC | PRN
  Filled 2023-01-11: qty 0.5

## 2023-01-11 MED ORDER — MIDAZOLAM HCL 2 MG/2ML IJ SOLN
INTRAMUSCULAR | Status: AC | PRN
Start: 2023-01-11 — End: 2023-01-11
  Administered 2023-01-11 (×2): 1 mg via INTRAVENOUS

## 2023-01-11 MED ORDER — MIDAZOLAM HCL 2 MG/2ML IJ SOLN
INTRAMUSCULAR | Status: AC
  Filled 2023-01-11: qty 4

## 2023-01-11 MED ORDER — SODIUM CHLORIDE 0.9% FLUSH
5.0000 mL | Freq: Three times a day (TID) | INTRAVENOUS | Status: DC
  Administered 2023-01-11 – 2023-01-16 (×14): 5 mL

## 2023-01-11 MED ORDER — TRAVASOL 10 % IV SOLN
INTRAVENOUS | Status: AC
  Filled 2023-01-11: qty 940.8

## 2023-01-11 MED ORDER — HEPARIN (PORCINE) 25000 UT/250ML-% IV SOLN
1800.0000 [IU]/h | INTRAVENOUS | Status: AC
  Administered 2023-01-11 – 2023-01-12 (×3): 1800 [IU]/h via INTRAVENOUS
  Filled 2023-01-11 (×3): qty 250

## 2023-01-11 MED ORDER — SODIUM CHLORIDE 0.9 % IV SOLN
INTRAVENOUS | Status: AC
  Filled 2023-01-11: qty 250

## 2023-01-11 MED ORDER — FENTANYL CITRATE (PF) 100 MCG/2ML IJ SOLN
INTRAMUSCULAR | Status: AC | PRN
Start: 2023-01-11 — End: 2023-01-11
  Administered 2023-01-11 (×2): 50 ug via INTRAVENOUS

## 2023-01-11 NOTE — TOC Progression Note (Signed)
Transition of Care [facility]) - Progression Note    Patient Details  Name: [name] MRN: [id number] Date of Birth: [birth year]  Transition of Care [facility]) CM/SW Contact  Howell Rucks, RN Phone Number: 01/11/2023, 9:36 AM  Clinical Narrative:  [facility] consulted for [facility], consult states pt agreeable. Call to  [facility], rep-DJ, referral initiated, DJ to review and initiate auth with insurance.     Expected Discharge Plan: Home/Self Care (unsure at this time) Barriers to Discharge: Continued Medical Work up  Expected Discharge Plan and Services In-house Referral: Hospice / Palliative Care Discharge Planning Services: CM Consult Post Acute Care Choice: NA Living arrangements for the past 2 months: Single Family Home                 DME Arranged: N/A DME Agency: NA       HH Arranged: NA HH Agency: NA         Social Determinants of Health (SDOH) Interventions SDOH Screenings   Food Insecurity: No Food Insecurity (11/21/2022)  Housing: Patient Unable To Answer (11/21/2022)  Transportation Needs: No Transportation Needs (11/21/2022)  Utilities: Not At Risk (11/21/2022)  Tobacco Use: Medium Risk (01/05/2023)    Readmission Risk Interventions    12/20/2022    3:50 PM 12/15/2022    2:43 PM  Readmission Risk Prevention Plan  Transportation Screening Complete Complete  PCP or Specialist Appt within 3-5 Days  Complete  HRI or Home Care Consult  Complete  Social Work Consult for Recovery Care Planning/Counseling  Complete  Palliative Care Screening  Complete  Medication Review [employer]) Complete Complete  PCP or Specialist appointment within 3-5 days of discharge Complete   HRI or Home Care Consult Complete   SW Recovery Care/Counseling Consult Complete   Palliative Care Screening Complete   Skilled Nursing Facility Not Applicable

## 2023-01-11 NOTE — Telephone Encounter (Signed)
[employer] work capacity form received.  Requests last two office notes.  This nurse completed request signed by provider.  Patient continues admission status since 12/12/2022.  Routed most recent office notes for claim no. [id number].  Mailed form to [name] address on file.  [address] [place] [address]

## 2023-01-11 NOTE — Progress Notes (Addendum)
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 Active Problems:   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   Malignant neoplasm of upper-outer quadrant of right breast in female, estrogen receptor positive (HCC)   Hypokalemia   Port-A-Cath in place   Breast cancer (HCC)   Crohn's disease of both small and large intestine with intestinal obstruction (HCC)   Leukocytosis   Chronic disease anemia   Protein-calorie malnutrition, severe (HCC)   Failure to thrive in adult   Anorexia   Cardiac arrest (HCC)   Septic shock (HCC)   SBO with strangulation s/p resection 12/14/2022   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  Underwent ex lap on 9/5 for bowel resection due to bowel ischemia # Ex lap, ileostomy and abdominal closure on 9/7 - She developed shortness and high output ostomy.  Continued fever and leukocytosis. - CT 9/16 showed large amount of ascites.  She had been on vancomycin and Zosyn at that point ID was engaged.  Micafungin was added on 9/17 for possible fungemia. - Patient has continued to feed her on ciprofloxacin, metronidazole and micafungin.  Blood and fungal cultures have been no growth so far form 9/18 9/7 paracentesis with no growth. - On 9/22 CT abdomen pelvis there is concern for early developing obstruction/peritonitis.  Repeat paracentesis on 9/23.  General surgery engaged and noted that  abdominal exam is not impressive, may need repeat blood cultures source could be lines or urine.  Repeat scan if no other source Thursday or Friday. - Patient does not have urinary symptoms.  As previously noted blood cultures and fungal cultures from 9/18 are with no growth. -As pt continues to fever would recommend all lines to remove any sources. Of note I suspect a developing obstruction/peritonitis may be contributing to fever and leukocytosis as well.  Recommendations: -PICC line was removed on 9/26, repeat blood cultures obtained - Port was removed on 9/27 with general surgery. 72 hr line holiday and CVC placed on 10/1 - Blood Cx remain negative - CT AP sowed Large central fluid collection measuring 19 x 6 cm, similar to prior study.Conveyed results to general surgery->IR consulted for aspiration.Please obtain Cx.   Microbiology:   Antibiotics: Ciprofloxacin, metronidazole, micafungin Cultures: Blood 9/18 fungal blood cultures no growth 9/26 Bcx NG 9/30 Bcx NG  SUBJECTIVE: Resting in bed.  No new complaints. Interval: Afebrile overnight, WBC 21K Review of Systems: Review of Systems  All other systems reviewed and are negative.    Scheduled Meds:  ALPRAZolam  0.25 mg Oral BID   ascorbic acid  500 mg Per Tube BID   diphenoxylate-atropine  2 tablet Per Tube QID   famotidine  20 mg Per Tube Daily   feeding supplement (KATE FARMS STANDARD 1.4)  325 mL Oral BID BM   feeding supplement (KATE FARMS STANDARD 1.4)  360 mL Per Tube Q24H   ferrous sulfate  300 mg  Per Tube BID   fiber  1 packet Per Tube TID   gabapentin  200 mg Per Tube Q8H   insulin aspart  0-9 Units Subcutaneous 4 times per day   loperamide HCl  4 mg Per Tube QID   metoprolol tartrate  50 mg Per Tube BID   mouth rinse  15 mL Mouth Rinse 4 times per day   sodium chloride flush  10-40 mL Intracatheter Q12H   Vitamin D (Ergocalciferol)  50,000 Units Oral Q7 days   zinc sulfate  220 mg Per Tube Daily    Continuous Infusions:  ciprofloxacin 400 mg (01/11/23 0928)   heparin 1,800 Units/hr (01/11/23 0200)   metronidazole 500 mg (01/11/23 1034)   promethazine (PHENERGAN) injection (IM or IVPB)     TPN CYCLIC-ADULT (ION)     PRN Meds:.acetaminophen, albuterol, artificial tears, fentaNYL (SUBLIMAZE) injection, food thickener, labetalol, lip balm, LORazepam, [DISCONTINUED] ondansetron **OR** ondansetron (ZOFRAN) IV, mouth rinse, mouth rinse, mouth rinse, mouth rinse, oxyCODONE, promethazine (PHENERGAN) injection (IM or IVPB), silver nitrate applicators, sodium chloride flush Allergies  Allergen Reactions   Nsaids Other (See Comments)    Crohns disease/ IBD   Chlorhexidine Gluconate Itching    Able to use CHG swabs for ports, reaction with direct skin contact   Hydromorphone Other (See Comments)    Confusion/sedation   Lactose Intolerance (Gi) Other (See Comments)    Upset stomach/diarrhea    OBJECTIVE: Vitals:   01/11/23 0146 01/11/23 0427 01/11/23 0500 01/11/23 1025  BP: 106/75 103/75  108/70  Pulse: (!) 114 (!) 129  (!) 125  Resp: 17 16    Temp: 98.4 F (36.9 C) 99.3 F (37.4 C)  98.2 F (36.8 C)  TempSrc: Oral Oral  Oral  SpO2:  99%  100%  Weight:   51 kg   Height:       Body mass index is 19.3 kg/m.  Physical Exam Constitutional:      Appearance: Normal appearance.  HENT:     Head: Normocephalic and atraumatic.     Right Ear: Tympanic membrane normal.     Left Ear: Tympanic membrane normal.     Nose: Nose normal.     Mouth/Throat:     Mouth: Mucous membranes are moist.  Eyes:     Extraocular Movements: Extraocular movements intact.     Conjunctiva/sclera: Conjunctivae normal.     Pupils: Pupils are equal, round, and reactive to light.  Cardiovascular:     Rate and Rhythm: Normal rate and regular rhythm.     Heart sounds: No murmur heard.    No friction rub. No gallop.     Comments: Port out Pulmonary:     Effort: Pulmonary effort is normal.     Breath  sounds: Normal breath sounds.  Abdominal:     Comments: Abdominal wound  Musculoskeletal:        General: Normal range of motion.  Skin:    General: Skin is warm and dry.  Neurological:     General: No focal deficit present.     Mental Status: She is alert and oriented to person, place, and time.  Psychiatric:        Mood and Affect: Mood normal.       Lab Results Lab Results  Component Value Date   WBC 21.6 (H) 01/11/2023   HGB 7.7 (L) 01/11/2023   HCT 25.5 (L) 01/11/2023   MCV 90.1 01/11/2023   PLT 425 (H) 01/11/2023    Lab Results  Component  Value Date   CREATININE 0.76 01/11/2023   BUN 17 01/11/2023   NA 131 (L) 01/11/2023   K 3.9 01/11/2023   CL 99 01/11/2023   CO2 23 01/11/2023    Lab Results  Component Value Date   ALT 22 01/11/2023   AST 32 01/11/2023   ALKPHOS 122 01/11/2023   BILITOT 0.2 (L) 01/11/2023        Danelle Earthly, MD [facility] 01/11/2023, 12:19 PM I have personally spent 52 minutes involved in face-to-face and non-face-to-face activities for this patient on the day of the visit. Professional time spent includes the following activities: Preparing to see the patient (review of tests), Obtaining and/or reviewing separately obtained history (admission/discharge record), Performing a medically appropriate examination and/or evaluation , Ordering medications/tests/procedures, referring and communicating with other health care professionals, Documenting clinical information in the EMR, Independently interpreting results (not separately reported), Communicating results to the patient/family/caregiver, Counseling and educating the patient/family/caregiver and Care coordination (not separately reported).

## 2023-01-11 NOTE — Progress Notes (Signed)
OT Cancellation Note  Patient Details Name: [name] MRN: [id number] DOB: [birth year]   Cancelled Treatment:    Reason Eval/Treat Not Completed: Pain limiting ability to participate.  Port removal completed today with pt recently returning to room. Declined participating in OT session due to pain, soreness, and discomfort after procedure. OT will follow up with pt tomorrow as able.   Limmie Patricia, OTR/L,CBIS  Supplemental OT - [facility] and [facility] Secure Chat Preferred   01/11/2023, 3:59 PM

## 2023-01-11 NOTE — Progress Notes (Signed)
Progress Note  6 Days Post-Op  Subjective: Updated patient on CT findings and IR consult for drainage. Having high ostomy output still. Denies uncontrolled pain.   Objective: Vital signs in last 24 hours: Temp:  [98 F (36.7 C)-99.3 F (37.4 C)] 99.3 F (37.4 C) (10/03 0427) Pulse Rate:  [110-129] 129 (10/03 0427) Resp:  [15-22] 16 (10/03 0427) BP: (95-108)/(66-84) 103/75 (10/03 0427) SpO2:  [98 %-100 %] 99 % (10/03 0427) Weight:  [51 kg] 51 kg (10/03 0500) Last BM Date : 01/09/23  Intake/Output from previous day: 10/02 0701 - 10/03 0700 In: 1341.1 [I.V.:631.2; NG/GT:60; IV Piggyback:649.9] Out: 2150 [Urine:450; Stool:1700] Intake/Output this shift: No intake/output data recorded.  PE: General appearance: alert and cooperative GI: soft, non-tender; bowel sounds normal; no masses,  no organomegaly Midline incision with VAC in place, holding suction.    Lab Results:  Recent Labs    01/10/23 0255 01/11/23 0357  WBC 22.0* 21.6*  HGB 8.3* 7.7*  HCT 27.2* 25.5*  PLT 458* 425*   BMET Recent Labs    01/10/23 0255 01/11/23 0357  NA 133* 131*  K 4.5 3.9  CL 100 99  CO2 23 23  GLUCOSE 124* 152*  BUN 14 17  CREATININE 0.85 0.76  CALCIUM 8.2* 8.5*   PT/INR No results for input(s): "LABPROT", "INR" in the last 72 hours. CMP     Component Value Date/Time   NA 131 (L) 01/11/2023 0357   NA 135 01/21/2020 1056   K 3.9 01/11/2023 0357   CL 99 01/11/2023 0357   CO2 23 01/11/2023 0357   GLUCOSE 152 (H) 01/11/2023 0357   BUN 17 01/11/2023 0357   BUN 12 01/21/2020 1056   CREATININE 0.76 01/11/2023 0357   CREATININE 0.91 09/21/2022 1141   CALCIUM 8.5 (L) 01/11/2023 0357   PROT 6.9 01/11/2023 0357   ALBUMIN 2.0 (L) 01/11/2023 0357   AST 32 01/11/2023 0357   AST 18 09/21/2022 1141   ALT 22 01/11/2023 0357   ALT 12 09/21/2022 1141   ALKPHOS 122 01/11/2023 0357   BILITOT 0.2 (L) 01/11/2023 0357   BILITOT 0.4 09/21/2022 1141   GFRNONAA >60 01/11/2023 0357    GFRNONAA >60 09/21/2022 1141   GFRAA 108 01/21/2020 1056   Lipase     Component Value Date/Time   LIPASE 762 (H) 12/21/2022 0305       Studies/Results: CT ABDOMEN PELVIS W CONTRAST  Result Date: 01/10/2023 CLINICAL DATA:  Infection with multiple drains placed. EXAM: CT ABDOMEN AND PELVIS WITH CONTRAST TECHNIQUE: Multidetector CT imaging of the abdomen and pelvis was performed using the standard protocol following bolus administration of intravenous contrast. RADIATION DOSE REDUCTION: This exam was performed according to the departmental dose-optimization program which includes automated exposure control, adjustment of the mA and/or kV according to patient size and/or use of iterative reconstruction technique. CONTRAST:  80mL OMNIPAQUE IOHEXOL 300 MG/ML  SOLN COMPARISON:  12/31/2022 FINDINGS: Lower chest: Left lower lobe airspace opacity, improving since prior study, likely improving pneumonia. No effusions. Hepatobiliary: No focal hepatic abnormality. Gallbladder unremarkable. Pancreas: No focal abnormality or ductal dilatation. Spleen: No focal abnormality.  Normal size. Adrenals/Urinary Tract: Mild bilateral hydronephrosis, new since prior study. Urinary bladder is moderately distended. No suspicious renal or adrenal lesion. Stomach/Bowel: Feeding tube is in place terminating in the distal duodenum. Dilated loops of proximal small bowel again noted, measuring up to 3.7 cm, stable. Transition to decompressed small bowel near the left lower quadrant ostomy. Prior small bowel resection and partial  colectomy. Vascular/Lymphatic: No evidence of aneurysm or adenopathy. Reproductive: Enlarged fibroid uterus again noted. Other: Large fluid collection noted in the mid abdomen extending into the pelvis, measuring 19 x 6 cm, similar to prior study. Locules of gas are noted within the fluid. No drainage catheters are visualized. Musculoskeletal: No acute bony abnormality. IMPRESSION: Large central fluid  collection measuring 19 x 6 cm, similar to prior study. Fluid collection contains locules of gas. Continued dilated proximal small bowel loops with decompressed small bowel near the left lower quadrant ileostomy. Mild bilateral hydronephrosis, new since prior study. Feeding tube tip in the distal duodenum. Electronically Signed   By: Charlett Nose M.D.   On: 01/10/2023 23:19   IR Fluoro Guide CV Line Right  Result Date: 01/09/2023 INDICATION: History of Crohn disease.  TPN requiring central venous access EXAM: ULTRASOUND AND FLUOROSCOPIC GUIDED PLACEMENT OF TUNNELED CENTRAL VENOUS "Powerline" CATHETER MEDICATIONS: None. ANESTHESIA/SEDATION: Local anesthetic was administered. The patient was continuously monitored during the procedure by the interventional radiology nurse under my direct supervision. FLUOROSCOPY TIME:  Fluoroscopic dose; 4 mGy COMPLICATIONS: None immediate. PROCEDURE: Informed written consent was obtained from the patient and/or patient's representative after a discussion of the risks, benefits, and alternatives to treatment. Questions regarding the procedure were encouraged and answered. The RIGHT neck and chest were prepped with chlorhexidine in a sterile fashion, and a sterile drape was applied covering the operative field. Maximum barrier sterile technique with sterile gowns and gloves were used for the procedure. A timeout was performed prior to the initiation of the procedure. After the overlying soft tissues were anesthetized, a small venotomy incision was created and a micropuncture kit was utilized to access the internal jugular vein. Real-time ultrasound guidance was utilized for vascular access including the acquisition of a permanent ultrasound image documenting patency of the accessed vessel. The microwire was utilized to measure appropriate catheter length. The micropuncture sheath was exchanged for a peel-away sheath over a guidewire. A 6 Fr dual lumen tunneled central venous  catheter measuring 20 cm was tunneled in a retrograde fashion from the anterior chest wall to the venotomy incision. The catheter was then placed through the peel-away sheath with tip ultimately positioned at the superior caval-atrial junction. Final catheter positioning was confirmed and documented with a spot radiographic image. The catheter aspirates and flushes normally. The catheter was flushed with appropriate volume heparin dwells. The catheter exit site was secured with a 2-0 Ethilon retention suture. The venotomy incision was closed with Dermabond. Dressings were applied. The patient tolerated the procedure well without immediate post procedural complication. FINDINGS: After catheter placement, the tip lies within the superior apect of the right atrium. The catheter aspirates and flushes normally and is ready for immediate use. IMPRESSION: Successful placement of a 20 cm dual lumen tunneled central venous "Powerline" catheter via the RIGHT internal jugular vein The tip of the catheter is positioned within the proximal RIGHT atrium. The catheter is ready for immediate use. Roanna Banning, MD [facility] Electronically Signed   By: Roanna Banning M.D.   On: 01/09/2023 16:16    Anti-infectives: Anti-infectives (From admission, onward)    Start     Dose/Rate Route Frequency Ordered Stop   12/29/22 1215  ciprofloxacin (CIPRO) IVPB 400 mg        400 mg 200 mL/hr over 60 Minutes Intravenous 2 times daily 12/29/22 1123     12/29/22 1215  metroNIDAZOLE (FLAGYL) IVPB 500 mg  500 mg 100 mL/hr over 60 Minutes Intravenous 2 times daily 12/29/22 1123     12/28/22 1430  micafungin (MYCAMINE) 100 mg in sodium chloride 0.9 % 100 mL IVPB  Status:  Discontinued        100 mg 105 mL/hr over 1 Hours Intravenous Every 24 hours 12/28/22 1335 01/05/23 0731   12/23/22 2000  vancomycin (VANCOREADY) IVPB 1250 mg/250 mL  Status:  Discontinued        1,250  mg 166.7 mL/hr over 90 Minutes Intravenous Every 36 hours 12/22/22 0612 12/22/22 1103   12/22/22 0645  vancomycin (VANCOREADY) IVPB 1250 mg/250 mL        1,250 mg 166.7 mL/hr over 90 Minutes Intravenous  Once 12/22/22 0551 12/22/22 0805   12/22/22 0600  piperacillin-tazobactam (ZOSYN) IVPB 3.375 g  Status:  Discontinued        3.375 g 12.5 mL/hr over 240 Minutes Intravenous Every 8 hours 12/22/22 0551 12/29/22 1123   12/13/22 1000  piperacillin-tazobactam (ZOSYN) IVPB 3.375 g  Status:  Discontinued        3.375 g 12.5 mL/hr over 240 Minutes Intravenous Every 8 hours 12/13/22 1610 12/20/22 1333        Assessment/Plan  Hx Crohn's disease s/p subtotal colectomy  Internal hernia with bowel ischemia - POD 27 exploratory laparotomy with small bowel resection 9/5 Dr. Hillery Hunter - POD 25  s/p exploratory laparotomy, ileostomy, abdominal closure 9/7 Dr. 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 Highly likely to have high ostomy output  - s/p PAC removal 9/27. Pre-alb 11 and Albumin < 1.5 on 9/19 - Imodium 4mg  QID, Iron 300mg  BID, Fibercon 625mg  TID. lomotil 4 mg QID. - DYS 3 diet, nocturnal TF, new CVC placed by IR 10/1 >> resume TPN - Diet per SLP. DYS3 - poor PO intake at current, PO supplements per dietician. - WOCN following for new ostomy. Plan ostomy clinic referral at d/c - Midline wound with hx of bleeding. Resolved. Cont VAC M/W/F, white foam D/C-ed 9/30.Healing well. - CT A/P 9/16 done. No indication for return to the OR. Low suspicion for bowel ischemia currently. Ascites without signs of infection on para. Lactate 9/25 WNL. Central line removed 9/26. Port removed 9/27. WBC down trending. Fevers resolved. CT AP with large collection - IR consult for drainage  - ABx and anti-fungals per ID - PT/OT - Palliative following peripherally currently.      ID - Zosyn 9/4>>9/11. 9/13 >> Micafungin 9/19 >> fevers resolved since port  removal.  LE [place] 9/18 neg. UE [place] 9/19 neg. UA neg. CXR w/ L > R opacities with b/l pleural effusions. Resp Cx NGTD. Bcx including specific fungal blood cx NGTD. PAC out 9/27- WBC 21.    FEN - NPO and TF on hold for IR - ok to resume nocturnal TF and diet after, TPN VTE - SCDs, Hep gtt on hold  Foley - Failed TOV. Foley in place with good UOP.      - per [facility] - PE - Noted on CTA 9/10. LE [place] negative for DVT. Heparin gtt. Hgb 7.7, continue to monitor. Agree with CCM that we may need to consider IVC temporary filter at some point if unable to restart heparin gtt (hx breast ca, recent surgery, immobilized). Shock - off pressors VDRF - extubated  Recent cardiac arrest 9/3 AKI - Cr stable on most recent labs Anemia - Transfuse to keep hemoglobin above 7. Consider IV iron supplementation -  she has short gut so worried she will not absorb iron well orally. Last PT-INR 9/15 was 1.3.  Hx metastatic breast cancer    LOS: 29 days      Juliet Rude, [facility] Surgery 01/11/2023, 10:09 AM Please see Amion for pager number during day hours 7:00am-4:30pm

## 2023-01-11 NOTE — Consult Note (Addendum)
WOC Nurse wound follow up Wound type: full thickness surgical post-op wound to midline abd; beefy red and shallow, small amt bloody drainage when sponge removed. WOC will assess appearance with surgical PA on Mon to discuss discontinuing Vac since wound is very shallow at this point. Pt was medicated for pain prior to the procedure and tolerated with minimal amt discomfort. Applied one piece black foam, cont suction at cont suction.   Ostomy pouch is located in close proximity, but they are separate.  It is intact with good seal and attached to a high output pouch and bedside drainage bag with large amt yellow liquid stool with some chunks. 5 sets of supplies at the bedside for staff nurse's use.  Use supplies: skin barrier [employer] [place] [phone]), 2 1/4" high output pouch [place] [phone]) and 2" barrier ring [place] [phone])  WOC team will see the patient again on Mon.  Thank-you,  Cammie Mcgee MSN, RN, CWOCN, [place], CNS [phone]

## 2023-01-11 NOTE — Progress Notes (Signed)
ANTICOAGULATION CONSULT NOTE  Pharmacy Consult for heparin Indication: pulmonary embolus  Allergies  Allergen Reactions   Nsaids Other (See Comments)    Crohns disease/ IBD   Chlorhexidine Gluconate Itching    Able to use CHG swabs for ports, reaction with direct skin contact   Hydromorphone Other (See Comments)    Confusion/sedation   Lactose Intolerance (Gi) Other (See Comments)    Upset stomach/diarrhea    Patient Measurements: Height: 5\' 4"  (162.6 cm) Weight: 51 kg (112 lb 7 oz) IBW/kg (Calculated) : 54.7 Heparin Dosing Weight: TBW  Vital Signs: Temp: 99.3 F (37.4 C) (10/03 0427) Temp Source: Oral (10/03 0427) BP: 103/75 (10/03 0427) Pulse Rate: 129 (10/03 0427)  Labs: Recent Labs    01/09/23 0806 01/10/23 0255 01/11/23 0357  HGB  --  8.3* 7.7*  HCT  --  27.2* 25.5*  PLT  --  458* 425*  HEPARINUNFRC 0.68 0.56 0.55  CREATININE 0.80 0.85 0.76    Estimated Creatinine Clearance: 71.5 mL/min (by C-G formula based on SCr of 0.76 mg/dL).   Medications:  No AC PTA.  Assessment: 47 yo F with a PMH of recurrent breast cancer not currently on treatment, who presented to the ED on 12/12/2022 with abdominal pain, nausea, vomiting diarrhea.   Patient was placed on heparin drip from 9/10-9/12 for new LLL PE. On 9/12 heparin drip was discontinued due to hemoglobin dropping and intermittent bleeding in ostomy.   On 9/21: Pharmacy has been consulted to resume heparin drip after transfusion (Hgb 7, transfused 1 unit PRBC) On 9/27 heparin held for [facility], resumed w/ no bolus at 13:00  01/11/2023 - Heparin level 0.55 - therapeutic with heparin infusion at 1800 units/hr - Hgb = 7.7, low but stable - Plt = 425, elevated - No bleeding reported per RN - per RN paused heparin at 7:10am per surgery PA, Hosie Spangle, for procedure today    Goal of Therapy:  Heparin level 0.3-0.7 units/ml Monitor platelets by anticoagulation protocol: Yes   Plan:  Surgery to comment when  to re-start heparin Daily CBC & Heparin level Monitor for signs/symptoms of bleeding Follow up long-term anticoagulation plans    Josefa Half, PharmD, BCPS Clinical Pharmacist 01/11/2023 8:42 AM

## 2023-01-11 NOTE — Progress Notes (Signed)
/   Referring Physician(s): Kinsinger,L  Supervising Physician: Marliss Coots  Patient Status:  [facility] - In-pt  Chief Complaint:  Nausea/vomiting; abdominal fluid collection  Subjective: Pt known to IR team from paracenteses on 12/26/22, 01/01/23 and tunneled CVC placement on 01/09/23. 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 She has midline abd wound vac in place as well as ostomy. She is afebrile, WBC 21.6(22), hgb 7.7, creat nl, PT/INR pend, latest blood/perit fluid cx neg; CT A/P yesterday revealed:    Large central fluid collection measuring 19 x 6 cm, similar to prior study. Fluid collection contains locules of gas.   Continued dilated proximal small bowel loops with decompressed small bowel near the left lower quadrant ileostomy.   Mild bilateral hydronephrosis, new since prior study.   Feeding tube tip in the distal duodenum.  Request now received from [facility] for inage guided abd fluid drainage; pt denies fever,HA,resp issues, or bleeding; does have some abd/back discomfort, N/V   Past Medical History:  Diagnosis Date   Breast cancer (HCC)    right   Colon stricture (HCC) 05/07/2019   Colonic stricture (HCC) 11/19/2022   Crohn's colitis, with intestinal obstruction (HCC) 09/17/2013   Dx 2006 - right and left colon involved 2011 - pan-colitis 09/17/2013 left colitis 60-20 cm March 2021 subtotal colectomy ileosigmoid anastomosis for colonic strictures-Dr. Maisie Fus     Crohn's disease [facility])    Exacerbation of Crohn's disease of large intestine (HCC) 12/13/2022   Family history of breast cancer 06/11/2020   History of abdominal subtotal colectomy 11/19/2022   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 10/17/2010   Rectal bleeding     Uterine fibroid    Vitamin D deficiency 09/24/2013   Past Surgical History:  Procedure Laterality Date   AXILLARY LYMPH NODE DISSECTION Right 11/09/2020   Procedure: RIGHT AXILLARY LYMPH NODE DISSECTION;  Surgeon: Emelia Loron, MD;  Location: [facility];  Service: General;  Laterality: Right;   BIOPSY  07/01/2020   Procedure: BIOPSY;  Surgeon: Benancio Deeds, MD;  Location: [facility];  Service: Gastroenterology;;   BIOPSY  01/27/2021   Procedure: BIOPSY;  Surgeon: Benancio Deeds, MD;  Location: [facility];  Service: Gastroenterology;;   BIOPSY  09/07/2021   Procedure: BIOPSY;  Surgeon: Lynann Bologna, MD;  Location: [facility];  Service: Gastroenterology;;   BIOPSY  11/21/2022   Procedure: BIOPSY;  Surgeon: Lemar Lofty., MD;  Location: [facility];  Service: Gastroenterology;;   BREAST BIOPSY Right 03/24/2022   [place] RT BREAST BX W LOC DEV 1ST LESION IMG BX SPEC US GUIDE 03/24/2022 [facility]   BREAST BIOPSY  04/28/2022   [place] RT RADIOACTIVE SEED LOC 04/28/2022 [facility]   BREAST IMPLANT REMOVAL Right 08/07/2022   Procedure: REMOVAL  OF RIGHT BREAST IMPLANTS;  Surgeon: Allena Napoleon, MD;  Location: [facility];  Service: Plastics;  Laterality: Right;   BREAST RECONSTRUCTION WITH PLACEMENT OF TISSUE EXPANDER AND FLEX HD (ACELLULAR HYDRATED DERMIS) Bilateral 11/09/2020   Procedure: BILATERAL BREAST RECONSTRUCTION WITH PLACEMENT OF TISSUE EXPANDER AND FLEX HD (ACELLULAR HYDRATED DERMIS);  Surgeon: Allena Napoleon, MD;  Location: [facility];  Service: Plastics;  Laterality: Bilateral;   CAPSULOTOMY Bilateral 07/25/2021   Procedure: CAPSULOTOMY;  Surgeon: Allena Napoleon, MD;  Location: [facility];  Service: Plastics;  Laterality: Bilateral;   COLECTOMY  06/27/2019   COLONOSCOPY  2015   ESOPHAGOGASTRODUODENOSCOPY (EGD) WITH PROPOFOL N/A 11/21/2022   Procedure: ESOPHAGOGASTRODUODENOSCOPY (EGD) WITH  PROPOFOL;  Surgeon: Lemar Lofty., MD;  Location: [facility];  Service: Gastroenterology;  Laterality: N/A;   FLEXIBLE SIGMOIDOSCOPY N/A 07/01/2020   Procedure: FLEXIBLE SIGMOIDOSCOPY;  Surgeon: Benancio Deeds, MD;  Location: [facility];  Service: Gastroenterology;  Laterality: N/A;   FLEXIBLE SIGMOIDOSCOPY N/A 01/27/2021   Procedure: FLEXIBLE SIGMOIDOSCOPY;  Surgeon: Benancio Deeds, MD;  Location: [facility];  Service: Gastroenterology;  Laterality: N/A;   FLEXIBLE SIGMOIDOSCOPY N/A 09/07/2021   Procedure: FLEXIBLE SIGMOIDOSCOPY;  Surgeon: Lynann Bologna, MD;  Location: [facility];  Service: Gastroenterology;  Laterality: N/A;   FLEXIBLE SIGMOIDOSCOPY N/A 11/21/2022   Procedure: FLEXIBLE SIGMOIDOSCOPY;  Surgeon: Meridee Score Netty Starring., MD;  Location: [facility];  Service: Gastroenterology;  Laterality: N/A;   IR FLUORO GUIDE CV LINE RIGHT  01/09/2023   IR US GUIDE VASC ACCESS RIGHT  01/09/2023   LAPAROTOMY N/A 12/14/2022   Procedure: EXPLORATORY LAPAROTOMY WITH SMALL BOWEL RESECTION;  Surgeon: Moise Boring, MD;  Location: [facility];  Service: General;  Laterality: N/A;   LAPAROTOMY N/A 12/16/2022   Procedure: REOPENING LAPAROTOMY, ILEOSTOMY;  ABDOMINAL CLOSURE;  Surgeon: Moise Boring, MD;  Location: [facility];  Service: General;  Laterality: N/A;   NIPPLE SPARING MASTECTOMY Bilateral 11/09/2020   Procedure: BILATERAL NIPPLE SPARING MASTECTOMY;  Surgeon: Emelia Loron, MD;  Location: [facility];  Service: General;  Laterality: Bilateral;   PORT-A-CATH REMOVAL N/A 07/25/2021   Procedure: REMOVAL PORT-A-CATH;  Surgeon: Allena Napoleon, MD;  Location: [facility];  Service: Plastics;  Laterality: N/A;   PORT-A-CATH REMOVAL Left 01/05/2023   Procedure: REMOVAL PORT-A-CATH;  Surgeon: Abigail Miyamoto, MD;  Location: [facility];  Service: General;  Laterality: Left;   PORTACATH PLACEMENT N/A 06/23/2020   Procedure: INSERTION PORT-A-CATH;   Surgeon: Emelia Loron, MD;  Location: [facility];  Service: General;  Laterality: N/A;  START TIME OF 11:00 AM FOR 60 MINUTES [place] [facility]   PORTACATH PLACEMENT Left 05/02/2022   Procedure: INSERTION PORT-A-CATH;  Surgeon: Emelia Loron, MD;  Location: [facility];  Service: General;  Laterality: Left;   RADIOACTIVE SEED GUIDED EXCISIONAL BREAST BIOPSY Right 05/02/2022   Procedure: RADIOACTIVE SEED GUIDED RIGHT BREAST MASS EXCISION;  Surgeon: Emelia Loron, MD;  Location: [facility];  Service: General;  Laterality: Right;   REMOVAL OF BILATERAL TISSUE EXPANDERS WITH PLACEMENT OF BILATERAL BREAST IMPLANTS Bilateral 07/25/2021   Procedure: REMOVAL OF BILATERAL TISSUE EXPANDERS WITH PLACEMENT OF BILATERAL BREAST IMPLANTS;  Surgeon: Allena Napoleon, MD;  Location:  [facility];  Service: Plastics;  Laterality: Bilateral;  1.5       Allergies: Nsaids, Chlorhexidine gluconate, Hydromorphone, and Lactose intolerance (gi)  Medications: Prior to Admission medications   Medication Sig Start Date End Date Taking? Authorizing Provider  acetaminophen (TYLENOL) 500 MG tablet Take 500-1,000 mg by mouth every 6 (six) hours as needed for mild pain.   Yes [provider]  cholecalciferol (VITAMIN D3) 25 MCG (1000 UNIT) tablet Take 1,000 Units by mouth daily.   Yes [provider]  hyoscyamine (LEVSIN SL) 0.125 MG SL tablet Place 1 tablet (0.125 mg total) under the tongue every 6 (six) hours as needed for cramping. 11/23/22  Yes Standley Brooking, MD  Multiple Vitamins-Minerals (  MULTIVITAMIN WITH /24MINERALS) tablet Take 1 tablet by mouth daily.   Yes [provider]  nystatin (MYCOSTATIN) 100000 UNIT/ML suspension Take 5 mLs (500,000 Units total) by mouth 4 (four) times daily. 11/23/22  Yes Standley Brooking, MD  potassium chloride SA (KLOR-CON M) 20 MEQ tablet Take 1 tablet (20 mEq total) by mouth daily. 12/27/21   Yes Iva Boop, MD  simethicone (MYLICON) 80 MG chewable tablet Chew 1 tablet (80 mg total) by mouth 4 (four) times daily as needed for flatulence. 11/23/22  Yes Standley Brooking, MD     Vital Signs: BP 108/70 (BP Location: Left Arm)   Pulse (!) 125   Temp 98.2 F (36.8 C) (Oral)   Resp 16   Ht 5\' 4"  (1.626 m)   Wt 112 lb 7 oz (51 kg)   LMP 04/24/2021   SpO2 100%   BMI 19.30 kg/m   Physical Exam; awake/alert; chest- CTA bilat; heart- tachy but reg rhythm; abd- soft, sl dist; midline wound vac, left sided ostomy intact, some diffuse tenderness to palpation; no LE edema  Imaging: CT ABDOMEN PELVIS W CONTRAST  Result Date: 01/10/2023 CLINICAL DATA:  Infection with multiple drains placed. EXAM: CT ABDOMEN AND PELVIS WITH CONTRAST TECHNIQUE: Multidetector CT imaging of the abdomen and pelvis was performed using the standard protocol following bolus administration of intravenous contrast. RADIATION DOSE REDUCTION: This exam was performed according to the departmental dose-optimization program which includes automated exposure control, adjustment of the mA and/or kV according to patient size and/or use of iterative reconstruction technique. CONTRAST:  80mL OMNIPAQUE IOHEXOL 300 MG/ML  SOLN COMPARISON:  12/31/2022 FINDINGS: Lower chest: Left lower lobe airspace opacity, improving since prior study, likely improving pneumonia. No effusions. Hepatobiliary: No focal hepatic abnormality. Gallbladder unremarkable. Pancreas: No focal abnormality or ductal dilatation. Spleen: No focal abnormality.  Normal size. Adrenals/Urinary Tract: Mild bilateral hydronephrosis, new since prior study. Urinary bladder is moderately distended. No suspicious renal or adrenal lesion. Stomach/Bowel: Feeding tube is in place terminating in the distal duodenum. Dilated loops of proximal small bowel again noted, measuring up to 3.7 cm, stable. Transition to decompressed small bowel near the left lower quadrant ostomy. Prior  small bowel resection and partial colectomy. Vascular/Lymphatic: No evidence of aneurysm or adenopathy. Reproductive: Enlarged fibroid uterus again noted. Other: Large fluid collection noted in the mid abdomen extending into the pelvis, measuring 19 x 6 cm, similar to prior study. Locules of gas are noted within the fluid. No drainage catheters are visualized. Musculoskeletal: No acute bony abnormality. IMPRESSION: Large central fluid collection measuring 19 x 6 cm, similar to prior study. Fluid collection contains locules of gas. Continued dilated proximal small bowel loops with decompressed small bowel near the left lower quadrant ileostomy. Mild bilateral hydronephrosis, new since prior study. Feeding tube tip in the distal duodenum. Electronically Signed   By: Charlett Nose M.D.   On: 01/10/2023 23:19   IR Fluoro Guide CV Line Right  Result Date: 01/09/2023 INDICATION: History of Crohn disease.  TPN requiring central venous access EXAM: ULTRASOUND AND FLUOROSCOPIC GUIDED PLACEMENT OF TUNNELED CENTRAL VENOUS "Powerline" CATHETER MEDICATIONS: None. ANESTHESIA/SEDATION: Local anesthetic was administered. The patient was continuously monitored during the procedure by the interventional radiology nurse under my direct supervision. FLUOROSCOPY TIME:  Fluoroscopic dose; 4 mGy COMPLICATIONS: None immediate. PROCEDURE: Informed written consent was obtained from the patient and/or patient's representative after a discussion of the risks, benefits, and alternatives to treatment. Questions regarding the procedure were encouraged and answered. The  RIGHT neck and chest were prepped with chlorhexidine in a sterile fashion, and a sterile drape was applied covering the operative field. Maximum barrier sterile technique with sterile gowns and gloves were used for the procedure. A timeout was performed prior to the initiation of the procedure. After the overlying soft tissues were anesthetized, a small venotomy incision was  created and a micropuncture kit was utilized to access the internal jugular vein. Real-time ultrasound guidance was utilized for vascular access including the acquisition of a permanent ultrasound image documenting patency of the accessed vessel. The microwire was utilized to measure appropriate catheter length. The micropuncture sheath was exchanged for a peel-away sheath over a guidewire. A 6 Fr dual lumen tunneled central venous catheter measuring 20 cm was tunneled in a retrograde fashion from the anterior chest wall to the venotomy incision. The catheter was then placed through the peel-away sheath with tip ultimately positioned at the superior caval-atrial junction. Final catheter positioning was confirmed and documented with a spot radiographic image. The catheter aspirates and flushes normally. The catheter was flushed with appropriate volume heparin dwells. The catheter exit site was secured with a 2-0 Ethilon retention suture. The venotomy incision was closed with Dermabond. Dressings were applied. The patient tolerated the procedure well without immediate post procedural complication. FINDINGS: After catheter placement, the tip lies within the superior apect of the right atrium. The catheter aspirates and flushes normally and is ready for immediate use. IMPRESSION: Successful placement of a 20 cm dual lumen tunneled central venous "Powerline" catheter via the RIGHT internal jugular vein The tip of the catheter is positioned within the proximal RIGHT atrium. The catheter is ready for immediate use. Roanna Banning, MD [facility] Electronically Signed   By: Roanna Banning M.D.   On: 01/09/2023 16:16   [place] EKG SITE RITE  Result Date: 01/08/2023 If Site Rite image not attached, placement could not be confirmed due to current cardiac rhythm.   Labs:  CBC: Recent Labs    01/07/23 0306 01/08/23 0541 01/10/23 0255 01/11/23 0357  WBC 21.4* 21.6*  22.0* 21.6*  HGB 7.9* 7.8* 8.3* 7.7*  HCT 25.8* 25.1* 27.2* 25.5*  PLT 456* 464* 458* 425*    COAGS: Recent Labs    09/28/22 0350 11/20/22 0252 12/22/22 1343 12/23/22 0520 12/24/22 0408 12/30/22 1338  INR 1.0   < > 1.3* 1.3* 1.3* 1.3*  APTT 28  --  33  --  36 38*   < > = values in this interval not displayed.    BMP: Recent Labs    01/08/23 0541 01/09/23 0806 01/10/23 0255 01/11/23 0357  NA 137 131* 133* 131*  K 3.3* 3.9 4.5 3.9  CL 102 98 100 99  CO2 26 24 23 23   GLUCOSE 94 104* 124* 152*  BUN 5* 6 14 17   CALCIUM 8.0* 8.3* 8.2* 8.5*  CREATININE 0.86 0.80 0.85 0.76  GFRNONAA >60 >60 >60 >60    LIVER FUNCTION TESTS: Recent Labs    01/01/23 0617 01/04/23 0600 01/08/23 0541 01/11/23 0357  BILITOT 0.4 0.7 0.5 0.2*  AST 32 44* 52* 32  ALT 26 30 32 22  ALKPHOS 148* 196* 156* 122  PROT 5.6* 6.2* 6.5 6.9  ALBUMIN <1.5* 1.5* 1.8* 2.0*    Assessment and Plan: 47 yo female with hx Chron's dz and prior subtotal colectomy, internal hernia with bowel ischemia, expl lap with SBR 9/5, ileostomy /abd closure 9/7, short gut 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 She has midline abd wound vac in place as well as ostomy. She is afebrile, WBC 21.6(22), hgb 7.7, creat nl, PT/INR pend, latest blood/perit fluid cx neg; CT A/P yesterday revealed:    Large central fluid collection measuring 19 x 6 cm, similar to prior study. Fluid collection contains locules of gas.   Continued dilated proximal small bowel loops with decompressed small bowel near the left lower quadrant ileostomy.   Mild bilateral hydronephrosis, new since prior study.   Feeding tube tip in the distal duodenum.  Request now received from [facility] for inage guided abd fluid drainage; imaging studies were reviewed by Dr. Elby Showers.Risks and benefits discussed with the patient including bleeding, infection, damage to adjacent structures, bowel perforation/fistula  connection, and sepsis.  All of the patient's questions were answered, patient is agreeable to proceed. Consent signed and in chart.  Procedure scheduled for today  Electronically Signed: D. Jeananne Rama, PA-C 01/11/2023, 10:56 AM   I spent a total of 25 Minutes at the the patient's bedside AND on the patient's hospital floor or unit, greater than 50% of which was counseling/coordinating care for image guided abdominal fluid collection drainage    Patient ID: KADEJA NEED, female   DOB: January 10, 1976, 47 y.o.   MRN: [id number]

## 2023-01-11 NOTE — Progress Notes (Signed)
[facility]                                                                               [name], is a 47 y.o. female, DOB - 05-May-1975, QMV:[id number] Admit date - 12/12/2022    Outpatient Primary MD for the patient is Pahwani, Kasandra Knudsen, MD  LOS - 29  days    Brief summary   [name] is a 47 y.o. female with PMH significant for Crohn's disease with previous bowel obstruction s/p subtotal colectomy 2021, colonic stricture, IDA, breast cancer s/p mastectomy 2022 currently not on treatment. Recently hospitalized 8/10-8/15/2024 for bowel obstruction seen by GI and general surgery. Underwent EGD and flexible sigmoidoscopy and discharged on steroids.   9/3, patient presented to the ED with abdominal distention, pain, nausea, vomiting CT abd/pelvis showed markedly dilated distal small bowel consistent with chronic obstruction. No changes since prior study.  Patient was admitted to [facility] with a plan of GI consultation.   9/4, in the morning, within few hours of admission, patient was found pulseless on the floor in the bathroom in ED, estimated downtime about 5 minutes. ACLS was initiated. Concern for possible torsades de pointes and given IV mag, defibrillated.  ROSC achieved in about 5 minutes. She was intubated. She had copious vomitus in the ETT.  Notably, she had I-stat peri-code which showed hemoglobin of 5.5 down from 10 overnight.  Admitted to ICU In the ICU, patient had right femoral CVC placed.  Given 3 minutes of PRBC, 3 units of FFP.  Patient was started on vasopressors   9/5, underwent exploratory laparotomy.  Noted to have internal hernia with associated small bowel ischemia.  Underwent bowel resection. 9/7, back to OR for ostomy placement 9/8, weaned off Levophed 9/10, extubated 9/10, found to have PE in CT angio chest.  Started on heparin drip which was subsequently stopped the next day because of drop in hemoglobin 9/12, mental status improved,  became more interactive.  However failed swallow eval. 9/16, transferred out to [facility] 9/16, CT abdomen showed peritoneal fluid collection 9/17, underwent paracentesis, 500 cc of clear yellow fluid drained 9/22, CT abdomen raised concern of early developing obstruction, peritonitis.  9/23, paracentesis by IR.  Drained 90 cc of turbid, amber/blood-tinged fluid 10/2 CT abd and pelvis showing Large central fluid collection measuring 19 x 6 cm, similar to prior study. Fluid collection contains locules of gas.   Patient's hospitalization has been prolonged because of persistent fever, persistent high output from ostomy, anemia requiring blood transfusion, dysphagia.  Assessment & Plan    Assessment and Plan:   Internal hernia with bowel ischemia/prior subtotal colectomy for Crohn's disease/short gut syndrome S/p ex lap  with small bowel resection on 9/5 by Dr. Hillery Hunter s/p ex lap, ileostomy, abdominal closure 9/7 Dr. Hillery Hunter Currently only has 100 cm of small bowel left , short gut syndrome TPN dependent.  To slow down ostomy output, patient is currently on Imodium, iron, FiberCon, Imodium.  Surgery following and managing.    Intermittent fever/sepsis ID on board and a repeat CT abdomen pelvis ordered for further evaluation. PICC line removed on 9/26 and repeat blood cultures have been negative  so far. Port-A-Cath was removed on 9/27 and a tunneled central venous catheter placed on 01/09/2023 Patient currently on ciprofloxacin and metronidazole for now.  CT abd and pelvis showing Large central fluid collection measuring 19 x 6 cm, similar to prior study. Fluid collection contains locules of gas. IR consulted for aspiration of the abscess.   Dysphagia Postextubation, patient failed speech therapy reevaluation multiple amps.  Gradually improving and currently on dysphagia 3 diet, tube feeding during the day and TPN during the night   Persistent sinus tachycardia: ? Deconditioning, vs from  PE.  Asymptomatic. Pain sub optimal.  Echo showed LVEF of 60 to 65%, Left ventricle has normal function. Right ventricular systolic function is normal.  On metoprolol 50 mg BID.     Acute pulmonary embolism on the left lower lobe Patient is on IV heparin restarted on 12/30/2022, currently on hold for the aspiration.    Anemia of acute illness superimposed on anemia of chronic disease/anemia of from blood loss postop S/p PRBC transfusions. Hemoglobin around 8.3 and stable, transfuse to keep it greater than 7    Hypomagnesemia Replaced   Mild hyponatremia Asymptomatic   Recurrent breast cancer Follow-up with oncology Dr. Pamelia Hoit  Malnutrition Type:  Nutrition Problem: Moderate Malnutrition Etiology: chronic illness (Crohn's disease; metastatic breast cancer)   Malnutrition Characteristics:  Signs/Symptoms: moderate fat depletion, moderate muscle depletion, percent weight loss (24% in 4.5 months) Percent weight loss: 24 % (in 4.5 months)   Nutrition Interventions:  Interventions: Tube feeding, TPN  Estimated body mass index is 19.3 kg/m as calculated from the following:   Height as of this encounter: 5\' 4"  (1.626 m).   Weight as of this encounter: 51 kg.  Code Status: full code.  DVT Prophylaxis:  Place and maintain sequential compression device Start: 12/15/22 1031 SCDs Start: 12/13/22 0413   Level of Care: Level of care: Progressive Family Communication: none at bedside.   Disposition Plan:     Remains inpatient appropriate:  TPN  Procedures:  S/p ex lap  with small bowel resection on 9/5 by Dr. Hillery Hunter s/p ex lap, ileostomy, abdominal closure 9/7 Dr. Marcos Eke venous tunneled catheter on 10/1  Consultants:   Gen surgery ID.   Antimicrobials:   Anti-infectives (From admission, onward)    Start     Dose/Rate Route Frequency Ordered Stop   12/29/22 1215  ciprofloxacin (CIPRO) IVPB 400 mg        400 mg 200 mL/hr over 60 Minutes Intravenous 2  times daily 12/29/22 1123     12/29/22 1215  metroNIDAZOLE (FLAGYL) IVPB 500 mg        500 mg 100 mL/hr over 60 Minutes Intravenous 2 times daily 12/29/22 1123     12/28/22 1430  micafungin (MYCAMINE) 100 mg in sodium chloride 0.9 % 100 mL IVPB  Status:  Discontinued        100 mg 105 mL/hr over 1 Hours Intravenous Every 24 hours 12/28/22 1335 01/05/23 0731   12/23/22 2000  vancomycin (VANCOREADY) IVPB 1250 mg/250 mL  Status:  Discontinued        1,250 mg 166.7 mL/hr over 90 Minutes Intravenous Every 36 hours 12/22/22 0612 12/22/22 1103   12/22/22 0645  vancomycin (VANCOREADY) IVPB 1250 mg/250 mL        1,250 mg 166.7 mL/hr over 90 Minutes Intravenous  Once 12/22/22 0551 12/22/22 0805   12/22/22 0600  piperacillin-tazobactam (ZOSYN) IVPB 3.375 g  Status:  Discontinued        3.375 g  12.5 mL/hr over 240 Minutes Intravenous Every 8 hours 12/22/22 0551 12/29/22 1123   12/13/22 1000  piperacillin-tazobactam (ZOSYN) IVPB 3.375 g  Status:  Discontinued        3.375 g 12.5 mL/hr over 240 Minutes Intravenous Every 8 hours 12/13/22 0833 12/20/22 1333        Medications  Scheduled Meds:  ALPRAZolam  0.25 mg Oral BID   ascorbic acid  500 mg Per Tube BID   diphenoxylate-atropine  2 tablet Per Tube QID   famotidine  20 mg Per Tube Daily   feeding supplement (KATE FARMS STANDARD 1.4)  325 mL Oral BID BM   feeding supplement (KATE FARMS STANDARD 1.4)  360 mL Per Tube Q24H   ferrous sulfate  300 mg Per Tube BID   fiber  1 packet Per Tube TID   gabapentin  200 mg Per Tube Q8H   insulin aspart  0-9 Units Subcutaneous 4 times per day   loperamide HCl  4 mg Per Tube QID   metoprolol tartrate  50 mg Per Tube BID   mouth rinse  15 mL Mouth Rinse 4 times per day   sodium chloride flush  10-40 mL Intracatheter Q12H   Vitamin D (Ergocalciferol)  50,000 Units Oral Q7 days   zinc sulfate  220 mg Per Tube Daily   Continuous Infusions:  ciprofloxacin Stopped (01/11/23 1030)   metronidazole Stopped  (01/11/23 1134)   promethazine (PHENERGAN) injection (IM or IVPB)     TPN CYCLIC-ADULT (ION)     PRN Meds:.acetaminophen, albuterol, artificial tears, fentaNYL (SUBLIMAZE) injection, food thickener, labetalol, lip balm, LORazepam, [DISCONTINUED] ondansetron **OR** ondansetron (ZOFRAN) IV, mouth rinse, mouth rinse, mouth rinse, mouth rinse, oxyCODONE, promethazine (PHENERGAN) injection (IM or IVPB), silver nitrate applicators, sodium chloride flush    Subjective:   Amija Dodrill was seen and examined today.  No new complaints.   Objective:   Vitals:   01/11/23 0427 01/11/23 0500 01/11/23 1025 01/11/23 1441  BP: 103/75  108/70 96/65  Pulse: (!) 129  (!) 125 (!) 134  Resp: 16   17  Temp: 99.3 F (37.4 C)  98.2 F (36.8 C)   TempSrc: Oral  Oral   SpO2: 99%  100% 96%  Weight:  51 kg    Height:        Intake/Output Summary (Last 24 hours) at 01/11/2023 1456 Last data filed at 01/11/2023 1422 Gross per 24 hour  Intake 1227.31 ml  Output 2850 ml  Net -1622.69 ml   Filed Weights   01/09/23 0500 01/10/23 0700 01/11/23 0500  Weight: 54.9 kg 51 kg 51 kg     Exam General exam: Appears calm and comfortable  Respiratory system: Clear to auscultation. Respiratory effort normal. Cardiovascular system: S1 & S2 heard, RRR.  Gastrointestinal system: Abdomen is soft, ostomy in place.  Central nervous system: Alert and oriented. No focal neurological deficits. Extremities: Symmetric 5 x 5 power. Skin: No rashes,  Psychiatry:  Mood & affect appropriate.    Data Reviewed:  I have personally reviewed following labs and imaging studies   CBC Lab Results  Component Value Date   WBC 21.6 (H) 01/11/2023   RBC 2.83 (L) 01/11/2023   HGB 7.7 (L) 01/11/2023   HCT 25.5 (L) 01/11/2023   MCV 90.1 01/11/2023   MCH 27.2 01/11/2023   PLT 425 (H) 01/11/2023   MCHC 30.2 01/11/2023   RDW 18.2 (H) 01/11/2023   LYMPHSABS 0.8 12/19/2022   MONOABS 0.3 12/19/2022   EOSABS 0.0 12/19/2022  BASOSABS 0.1 12/19/2022     Last metabolic panel Lab Results  Component Value Date   NA 131 (L) 01/11/2023   K 3.9 01/11/2023   CL 99 01/11/2023   CO2 23 01/11/2023   BUN 17 01/11/2023   CREATININE 0.76 01/11/2023   GLUCOSE 152 (H) 01/11/2023   GFRNONAA >60 01/11/2023   GFRAA 108 01/21/2020   CALCIUM 8.5 (L) 01/11/2023   PHOS 3.6 01/11/2023   PROT 6.9 01/11/2023   ALBUMIN 2.0 (L) 01/11/2023   BILITOT 0.2 (L) 01/11/2023   ALKPHOS 122 01/11/2023   AST 32 01/11/2023   ALT 22 01/11/2023   ANIONGAP 9 01/11/2023    CBG (last 3)  Recent Labs    01/10/23 2358 01/11/23 0811 01/11/23 1134  GLUCAP 137* 173* 90      Coagulation Profile: Recent Labs  Lab 01/11/23 1143  INR 1.2     Radiology Studies: IR US Guide Vasc Access Right  Result Date: 01/11/2023 INDICATION: History of Crohn disease.  TPN requiring central venous access EXAM: ULTRASOUND AND FLUOROSCOPIC GUIDED PLACEMENT OF TUNNELED CENTRAL VENOUS "Powerline" CATHETER MEDICATIONS: None. ANESTHESIA/SEDATION: Local anesthetic was administered. The patient was continuously monitored during the procedure by the interventional radiology nurse under my direct supervision. FLUOROSCOPY TIME:  Fluoroscopic dose; 4 mGy COMPLICATIONS: None immediate. PROCEDURE: Informed written consent was obtained from the patient and/or patient's representative after a discussion of the risks, benefits, and alternatives to treatment. Questions regarding the procedure were encouraged and answered. The RIGHT neck and chest were prepped with chlorhexidine in a sterile fashion, and a sterile drape was applied covering the operative field. Maximum barrier sterile technique with sterile gowns and gloves were used for the procedure. A timeout was performed prior to the initiation of the procedure. After the overlying soft tissues were anesthetized, a small venotomy incision was created and a micropuncture kit was utilized to access the internal jugular vein.  Real-time ultrasound guidance was utilized for vascular access including the acquisition of a permanent ultrasound image documenting patency of the accessed vessel. The microwire was utilized to measure appropriate catheter length. The micropuncture sheath was exchanged for a peel-away sheath over a guidewire. A 6 Fr dual lumen tunneled central venous catheter measuring 20 cm was tunneled in a retrograde fashion from the anterior chest wall to the venotomy incision. The catheter was then placed through the peel-away sheath with tip ultimately positioned at the superior caval-atrial junction. Final catheter positioning was confirmed and documented with a spot radiographic image. The catheter aspirates and flushes normally. The catheter was flushed with appropriate volume heparin dwells. The catheter exit site was secured with a 2-0 Ethilon retention suture. The venotomy incision was closed with Dermabond. Dressings were applied. The patient tolerated the procedure well without immediate post procedural complication. FINDINGS: After catheter placement, the tip lies within the superior apect of the right atrium. The catheter aspirates and flushes normally and is ready for immediate use. IMPRESSION: Successful placement of a 20 cm dual lumen tunneled central venous "Powerline" catheter via the RIGHT internal jugular vein The tip of the catheter is positioned within the proximal RIGHT atrium. The catheter is ready for immediate use. Roanna Banning, MD [facility] Radiology Electronically Signed   By: Roanna Banning M.D.   On: 01/11/2023 10:55   CT ABDOMEN PELVIS W CONTRAST  Result Date: 01/10/2023 CLINICAL DATA:  Infection with multiple drains placed. EXAM: CT ABDOMEN AND PELVIS WITH CONTRAST TECHNIQUE: Multidetector CT imaging of the abdomen and  pelvis was performed using the standard protocol following bolus administration of intravenous contrast. RADIATION DOSE  REDUCTION: This exam was performed according to the departmental dose-optimization program which includes automated exposure control, adjustment of the mA and/or kV according to patient size and/or use of iterative reconstruction technique. CONTRAST:  80mL OMNIPAQUE IOHEXOL 300 MG/ML  SOLN COMPARISON:  12/31/2022 FINDINGS: Lower chest: Left lower lobe airspace opacity, improving since prior study, likely improving pneumonia. No effusions. Hepatobiliary: No focal hepatic abnormality. Gallbladder unremarkable. Pancreas: No focal abnormality or ductal dilatation. Spleen: No focal abnormality.  Normal size. Adrenals/Urinary Tract: Mild bilateral hydronephrosis, new since prior study. Urinary bladder is moderately distended. No suspicious renal or adrenal lesion. Stomach/Bowel: Feeding tube is in place terminating in the distal duodenum. Dilated loops of proximal small bowel again noted, measuring up to 3.7 cm, stable. Transition to decompressed small bowel near the left lower quadrant ostomy. Prior small bowel resection and partial colectomy. Vascular/Lymphatic: No evidence of aneurysm or adenopathy. Reproductive: Enlarged fibroid uterus again noted. Other: Large fluid collection noted in the mid abdomen extending into the pelvis, measuring 19 x 6 cm, similar to prior study. Locules of gas are noted within the fluid. No drainage catheters are visualized. Musculoskeletal: No acute bony abnormality. IMPRESSION: Large central fluid collection measuring 19 x 6 cm, similar to prior study. Fluid collection contains locules of gas. Continued dilated proximal small bowel loops with decompressed small bowel near the left lower quadrant ileostomy. Mild bilateral hydronephrosis, new since prior study. Feeding tube tip in the distal duodenum. Electronically Signed   By: Charlett Nose M.D.   On: 01/10/2023 23:19   IR Fluoro Guide CV Line Right  Result Date: 01/09/2023 INDICATION: History of Crohn disease.  TPN requiring central  venous access EXAM: ULTRASOUND AND FLUOROSCOPIC GUIDED PLACEMENT OF TUNNELED CENTRAL VENOUS "Powerline" CATHETER MEDICATIONS: None. ANESTHESIA/SEDATION: Local anesthetic was administered. The patient was continuously monitored during the procedure by the interventional radiology nurse under my direct supervision. FLUOROSCOPY TIME:  Fluoroscopic dose; 4 mGy COMPLICATIONS: None immediate. PROCEDURE: Informed written consent was obtained from the patient and/or patient's representative after a discussion of the risks, benefits, and alternatives to treatment. Questions regarding the procedure were encouraged and answered. The RIGHT neck and chest were prepped with chlorhexidine in a sterile fashion, and a sterile drape was applied covering the operative field. Maximum barrier sterile technique with sterile gowns and gloves were used for the procedure. A timeout was performed prior to the initiation of the procedure. After the overlying soft tissues were anesthetized, a small venotomy incision was created and a micropuncture kit was utilized to access the internal jugular vein. Real-time ultrasound guidance was utilized for vascular access including the acquisition of a permanent ultrasound image documenting patency of the accessed vessel. The microwire was utilized to measure appropriate catheter length. The micropuncture sheath was exchanged for a peel-away sheath over a guidewire. A 6 Fr dual lumen tunneled central venous catheter measuring 20 cm was tunneled in a retrograde fashion from the anterior chest wall to the venotomy incision. The catheter was then placed through the peel-away sheath with tip ultimately positioned at the superior caval-atrial junction. Final catheter positioning was confirmed and documented with a spot radiographic image. The catheter aspirates and flushes normally. The catheter was flushed with appropriate volume heparin dwells. The catheter exit site was secured with a 2-0 Ethilon  retention suture. The venotomy incision was closed with Dermabond. Dressings were applied. The patient tolerated the procedure well without immediate post procedural  complication. FINDINGS: After catheter placement, the tip lies within the superior apect of the right atrium. The catheter aspirates and flushes normally and is ready for immediate use. IMPRESSION: Successful placement of a 20 cm dual lumen tunneled central venous "Powerline" catheter via the RIGHT internal jugular vein The tip of the catheter is positioned within the proximal RIGHT atrium. The catheter is ready for immediate use. Roanna Banning, MD [facility] Radiology Electronically Signed   By: Roanna Banning M.D.   On: 01/09/2023 16:16       Kathlen Mody M.D. [facility] 01/11/2023, 2:56 PM  Available via Epic secure chat 7am-7pm After 7 pm, please refer to night coverage provider listed on amion.

## 2023-01-11 NOTE — Procedures (Signed)
Interventional Radiology Procedure Note  Procedure: CT guided abdominal drain placement  Findings: Please refer to procedural dictation for full description. 10 Fr pigtail drain placed via RLQ into lower abdominal fluid collection. Approximately 100 mL serosanguinous aspirate. Sample sent for culture. Drain to bulb suction.  Complications: None immediate  Estimated Blood Loss: < 5 mL  Recommendations: Keep to bulb suction. Follow culture. IR will follow.   Marliss Coots, MD

## 2023-01-11 NOTE — Plan of Care (Signed)
  Problem: Education: Goal: Ability to describe self-care measures that may prevent or decrease complications (Diabetes Survival Skills Education) will improve Outcome: Progressing   Problem: Fluid Volume: Goal: Ability to maintain a balanced intake and output will improve Outcome: Progressing   Problem: Metabolic: Goal: Ability to maintain appropriate glucose levels will improve Outcome: Progressing   Problem: Nutritional: Goal: Maintenance of adequate nutrition will improve Outcome: Progressing   Problem: Clinical Measurements: Goal: Respiratory complications will improve Outcome: Progressing

## 2023-01-11 NOTE — Progress Notes (Signed)
Brief Note Regarding Anticoagulation  47 year old female on heparin for pulmonary embolus. She has been therapeutic on heparin at 1800 units/hr. It was held this morning for abdominal drain placement. Per Dr. Elby Showers with IR, okay to resume heparin now. Will resume at previous rate and continue with daily monitoring.  Pricilla Riffle, PharmD, BCPS Clinical Pharmacist 01/11/2023 3:50 PM

## 2023-01-11 NOTE — Progress Notes (Signed)
SLP Cancellation Note  Patient Details Name: FANCHON PAPANIA MRN: [id number] DOB: 08/01/1975   Cancelled treatment:       Reason Eval/Treat Not Completed: Medical issues which prohibited therapy (NPO for procedure). Will f/u as able.     Mahala Menghini., M.A. CCC-SLP Acute Rehabilitation Services Office [phone]  Secure chat preferred  01/11/2023, 11:14 AM

## 2023-01-11 NOTE — Progress Notes (Addendum)
PHARMACY - TOTAL PARENTERAL NUTRITION CONSULT NOTE   Indication: Short bowel syndrome  Patient Measurements: Height: 5\' 4"  (162.6 cm) Weight: 51 kg (112 lb 7 oz) IBW/kg (Calculated) : 54.7 TPN AdjBW (KG): 57.8 Body mass index is 19.3 kg/m. Usual Weight: 46.3 kg from 8/28  Assessment: 47 y.o. female with PMH of recurrent breast cancer not currently on treatment, Crohn's disease s/p subtotal colectomy in 2021, recurrent obstructions, colonic stricture, recent hospitalization 8/10-8/15/2024 for obstruction who presented on 12/12/2022 with abdominal pain, nausea, vomiting, diarrhea.    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 9/26 Line holiday for persistent fevers, elevated WBC 9/30 Ok to place new PICC, 10/1 line placed and TPN resumed  Glucose / Insulin: no Hx DM -Steroids PTA for Crohn's disease; weaned off as of 9/13 -CBGs 87-137 -SSI: 1 unit given in past 24hrs Electrolytes: Na= 131, low; all others electrolytes WNL including CorrCa=10.1 Renal: WNL Hepatic: LFTs WNL. Albumin low, TG ok I/O:  10/2 7am to 10/3 7am - UOP: + 2 unmeasured urine occurrences -Ileostomy 1700 mL GI Imaging:  -CTA 9/10 shows new PE, but also abdominal ascites -CT Abd: Large volume ascites GI Surgeries / Procedures:  -OR 9/5 ex lap, internal hernia w/ small bowel ischemia, SBR & celiotomy; > 105 cm small bowel remaining -OR 9/7 for closure, ostomy creation -9/17: Paracentesis -9/23 paracentesis   Central access: powerline 10/1 Triple lumen CVC R subclavian 9/6, PAC 05/02/22 (REMOVED) TPN start date: 12/17/22 Held 9/26-9/30 for line holiday Resumed 10/1  Nutritional Goals: TPN provides 94  g protein and 1680 kcal per day  RD Assessment: Estimated Needs Total Energy Estimated Needs: 1600-1800 Total Protein Estimated Needs: 85-95 grams Total Fluid Estimated Needs: 1.6L/day  Current Nutrition:  Cyclic TPN (18:00-06:00) Diet: DYS 3 with nectar thickened liquids [employer] 1.4 orally BID, each supplement provides 455 kcal and 20 grams protein   Plan: -Continue cyclic TPN over 12 hr (18:00- 06:00) -Electrolytes in TPN: Na 80 mEq/L, K 50 mEq/L, Ca 5 mEq/L, Mg 7 mEq/L, Phos 15 mmol/L. Cl:Ac ratio 1:1 - Increase Sodium in TPN -Add standard MVI and trace elements to TPN -Replacing vit D per dietician recommendation per MD order -Continue sensitive SSI &  CBG checks 4x daily while on cyclic TPN (1-2 hr after completing ramp-up, on TPN, 1 hr after TPN stopped, off TPN) - CMP, Mag, Phos tomorrow AM -Monitor TPN labs on Mon/Thurs   Josefa Half, PharmD, BCPS Clinical Pharmacist 01/11/2023 8:36 AM

## 2023-01-11 NOTE — Plan of Care (Signed)
  Problem: Education: Goal: Ability to describe self-care measures that may prevent or decrease complications (Diabetes Survival Skills Education) will improve Outcome: Progressing   Problem: Coping: Goal: Ability to adjust to condition or change in health will improve Outcome: Progressing   Problem: Fluid Volume: Goal: Ability to maintain a balanced intake and output will improve Outcome: Progressing   Problem: Health Behavior/Discharge Planning: Goal: Ability to identify and utilize available resources and services will improve Outcome: Progressing Goal: Ability to manage health-related needs will improve Outcome: Progressing   Problem: Metabolic: Goal: Ability to maintain appropriate glucose levels will improve Outcome: Progressing   Problem: Nutritional: Goal: Maintenance of adequate nutrition will improve Outcome: Progressing   Problem: Skin Integrity: Goal: Risk for impaired skin integrity will decrease Outcome: Progressing   Problem: Tissue Perfusion: Goal: Adequacy of tissue perfusion will improve Outcome: Progressing   Problem: Clinical Measurements: Goal: Ability to maintain clinical measurements within normal limits will improve Outcome: Progressing

## 2023-01-12 ENCOUNTER — Inpatient Hospital Stay (HOSPITAL_COMMUNITY): Payer: Managed Care, Other (non HMO)

## 2023-01-12 DIAGNOSIS — Z9049 Acquired absence of other specified parts of digestive tract: Secondary | ICD-10-CM | POA: Diagnosis not present

## 2023-01-12 DIAGNOSIS — K90829 Short bowel syndrome, unspecified: Secondary | ICD-10-CM | POA: Diagnosis not present

## 2023-01-12 DIAGNOSIS — R509 Fever, unspecified: Secondary | ICD-10-CM | POA: Diagnosis not present

## 2023-01-12 DIAGNOSIS — D500 Iron deficiency anemia secondary to blood loss (chronic): Secondary | ICD-10-CM | POA: Diagnosis not present

## 2023-01-12 DIAGNOSIS — Z934 Other artificial openings of gastrointestinal tract status: Secondary | ICD-10-CM | POA: Diagnosis not present

## 2023-01-12 LAB — COMPREHENSIVE METABOLIC PANEL
ALT: 21 U/L (ref 0–44)
AST: 28 U/L (ref 15–41)
Albumin: 2.1 g/dL — ABNORMAL LOW (ref 3.5–5.0)
Alkaline Phosphatase: 113 U/L (ref 38–126)
Anion gap: 7 (ref 5–15)
BUN: 30 mg/dL — ABNORMAL HIGH (ref 6–20)
CO2: 23 mmol/L (ref 22–32)
Calcium: 8.3 mg/dL — ABNORMAL LOW (ref 8.9–10.3)
Chloride: 102 mmol/L (ref 98–111)
Creatinine, Ser: 0.72 mg/dL (ref 0.44–1.00)
GFR, Estimated: >60 mL/min (ref >60)
Glucose, Bld: 166 mg/dL — ABNORMAL HIGH (ref 70–99)
Potassium: 4.7 mmol/L (ref 3.5–5.1)
Sodium: 132 mmol/L — ABNORMAL LOW (ref 135–145)
Total Bilirubin: 0.4 mg/dL (ref 0.3–1.2)
Total Protein: 6.7 g/dL (ref 6.5–8.1)

## 2023-01-12 LAB — CBC
HCT: 24.1 % — ABNORMAL LOW (ref 36.0–46.0)
Hemoglobin: 7.5 g/dL — ABNORMAL LOW (ref 12.0–15.0)
MCH: 28.4 pg (ref 26.0–34.0)
MCHC: 31.1 g/dL (ref 30.0–36.0)
MCV: 91.3 fL (ref 80.0–100.0)
Platelets: 411 10*3/uL — ABNORMAL HIGH (ref 150–400)
RBC: 2.64 MIL/uL — ABNORMAL LOW (ref 3.87–5.11)
RDW: 18.6 % — ABNORMAL HIGH (ref 11.5–15.5)
WBC: 26.5 10*3/uL — ABNORMAL HIGH (ref 4.0–10.5)
nRBC: 0.1 % (ref 0.0–0.2)

## 2023-01-12 LAB — GLUCOSE, CAPILLARY
Glucose-Capillary: 123 mg/dL — ABNORMAL HIGH (ref 70–99)
Glucose-Capillary: 144 mg/dL — ABNORMAL HIGH (ref 70–99)
Glucose-Capillary: 151 mg/dL — ABNORMAL HIGH (ref 70–99)
Glucose-Capillary: 181 mg/dL — ABNORMAL HIGH (ref 70–99)
Glucose-Capillary: 98 mg/dL (ref 70–99)

## 2023-01-12 LAB — PHOSPHORUS: Phosphorus: 2.5 mg/dL (ref 2.5–4.6)

## 2023-01-12 LAB — MAGNESIUM: Magnesium: 1.5 mg/dL — ABNORMAL LOW (ref 1.7–2.4)

## 2023-01-12 LAB — HEPARIN LEVEL (UNFRACTIONATED): Heparin Unfractionated: 0.34 [IU]/mL (ref 0.30–0.70)

## 2023-01-12 MED ORDER — OXYCODONE HCL 5 MG PO TABS: 5.0000 mg | ORAL_TABLET | ORAL | Status: DC | PRN

## 2023-01-12 MED ORDER — FAMOTIDINE 40 MG/5ML PO SUSR
20.0000 mg | Freq: Every day | ORAL | Status: DC
  Administered 2023-01-12 – 2023-01-15 (×4): 20 mg via ORAL
  Filled 2023-01-12 (×5): qty 2.5

## 2023-01-12 MED ORDER — ACETAMINOPHEN 325 MG PO TABS: 650.0000 mg | ORAL_TABLET | Freq: Four times a day (QID) | ORAL | Status: DC | PRN

## 2023-01-12 MED ORDER — INSULIN ASPART 100 UNIT/ML IJ SOLN: 0.0000 [IU] | Freq: Three times a day (TID) | INTRAMUSCULAR | Status: DC

## 2023-01-12 MED ORDER — MAGNESIUM SULFATE 2 GM/50ML IV SOLN
2.0000 g | Freq: Once | INTRAVENOUS | Status: AC
  Administered 2023-01-12: 2 g via INTRAVENOUS
  Filled 2023-01-12: qty 50

## 2023-01-12 MED ORDER — FAMOTIDINE 40 MG/5ML PO SUSR: 20.0000 mg | Freq: Every day | ORAL | Status: DC

## 2023-01-12 MED ORDER — KATE FARMS STANDARD 1.4 PO LIQD: 325.0000 mL | Freq: Two times a day (BID) | ORAL | Status: DC

## 2023-01-12 MED ORDER — NUTRISOURCE FIBER PO PACK
1.0000 | PACK | Freq: Three times a day (TID) | ORAL | Status: DC
  Administered 2023-01-12 – 2023-01-15 (×12): 1 via ORAL
  Filled 2023-01-12 (×14): qty 1

## 2023-01-12 MED ORDER — HEPARIN (PORCINE) 25000 UT/250ML-% IV SOLN: 1800.0000 [IU]/h | INTRAVENOUS | Status: DC

## 2023-01-12 MED ORDER — METOPROLOL TARTRATE 50 MG PO TABS
50.0000 mg | ORAL_TABLET | Freq: Two times a day (BID) | ORAL | Status: DC
  Administered 2023-01-12 – 2023-01-15 (×6): 50 mg via ORAL
  Filled 2023-01-12 (×10): qty 1

## 2023-01-12 MED ORDER — METOPROLOL TARTRATE 50 MG PO TABS: 50.0000 mg | ORAL_TABLET | Freq: Two times a day (BID) | ORAL | Status: DC

## 2023-01-12 MED ORDER — ZINC SULFATE 220 (50 ZN) MG PO CAPS
220.0000 mg | ORAL_CAPSULE | Freq: Every day | ORAL | Status: AC
  Administered 2023-01-12: 220 mg via ORAL
  Filled 2023-01-12: qty 1

## 2023-01-12 MED ORDER — DIPHENOXYLATE-ATROPINE 2.5-0.025 MG PO TABS: 2.0000 | ORAL_TABLET | Freq: Four times a day (QID) | ORAL | Status: DC

## 2023-01-12 MED ORDER — LOPERAMIDE HCL 1 MG/7.5ML PO SUSP: 4.0000 mg | Freq: Four times a day (QID) | ORAL | Status: DC

## 2023-01-12 MED ORDER — ALBUTEROL SULFATE (2.5 MG/3ML) 0.083% IN NEBU: 2.5000 mg | INHALATION_SOLUTION | RESPIRATORY_TRACT | 12 refills | Status: DC | PRN

## 2023-01-12 MED ORDER — ARTIFICIAL TEARS OPHTHALMIC OINT: TOPICAL_OINTMENT | OPHTHALMIC | Status: DC | PRN

## 2023-01-12 MED ORDER — ASCORBIC ACID 500 MG PO TABS: 500.0000 mg | ORAL_TABLET | Freq: Two times a day (BID) | ORAL | Status: DC

## 2023-01-12 MED ORDER — LOPERAMIDE HCL 1 MG/7.5ML PO SUSP
4.0000 mg | Freq: Four times a day (QID) | ORAL | Status: DC
  Administered 2023-01-12 – 2023-01-15 (×16): 4 mg via ORAL
  Filled 2023-01-12 (×18): qty 30

## 2023-01-12 MED ORDER — VITAMIN C 500 MG PO TABS
500.0000 mg | ORAL_TABLET | Freq: Two times a day (BID) | ORAL | Status: DC
  Administered 2023-01-12 – 2023-01-16 (×9): 500 mg via ORAL
  Filled 2023-01-12 (×10): qty 1

## 2023-01-12 MED ORDER — GABAPENTIN 250 MG/5ML PO SOLN
200.0000 mg | Freq: Three times a day (TID) | ORAL | Status: DC
  Administered 2023-01-12 – 2023-01-15 (×9): 200 mg via ORAL
  Filled 2023-01-12 (×10): qty 4

## 2023-01-12 MED ORDER — TRAVASOL 10 % IV SOLN
INTRAVENOUS | Status: AC
  Filled 2023-01-12: qty 940.8

## 2023-01-12 MED ORDER — CARMEX CLASSIC LIP BALM EX OINT: TOPICAL_OINTMENT | CUTANEOUS | Status: DC | PRN

## 2023-01-12 MED ORDER — DIPHENOXYLATE-ATROPINE 2.5-0.025 MG PO TABS
2.0000 | ORAL_TABLET | Freq: Four times a day (QID) | ORAL | Status: DC
  Administered 2023-01-12 – 2023-01-15 (×13): 2 via ORAL
  Filled 2023-01-12 (×14): qty 2

## 2023-01-12 MED ORDER — LORAZEPAM 0.5 MG PO TABS: 0.5000 mg | ORAL_TABLET | Freq: Four times a day (QID) | ORAL | Status: DC | PRN

## 2023-01-12 MED ORDER — KATE FARMS STANDARD 1.4 PO LIQD: 360.0000 mL | ORAL | Status: DC

## 2023-01-12 MED ORDER — FOOD THICKENER (SIMPLYTHICK): 1.0000 | ORAL | Status: DC | PRN

## 2023-01-12 MED ORDER — VITAMIN D (ERGOCALCIFEROL) 1.25 MG (50000 UNIT) PO CAPS: 50000.0000 [IU] | ORAL_CAPSULE | ORAL | Status: DC

## 2023-01-12 MED ORDER — FERROUS SULFATE 300 (60 FE) MG/5ML PO SOLN: 300.0000 mg | Freq: Two times a day (BID) | ORAL | Status: DC

## 2023-01-12 MED ORDER — GABAPENTIN 250 MG/5ML PO SOLN: 200.0000 mg | Freq: Three times a day (TID) | ORAL | Status: DC

## 2023-01-12 MED ORDER — NUTRISOURCE FIBER PO PACK: 1.0000 | PACK | Freq: Three times a day (TID) | ORAL | Status: DC

## 2023-01-12 MED ORDER — KATE FARMS STANDARD 1.4 PO LIQD
360.0000 mL | ORAL | Status: DC
  Administered 2023-01-12 – 2023-01-13 (×3): 360 mL via ORAL
  Filled 2023-01-12 (×2): qty 650

## 2023-01-12 MED ORDER — METRONIDAZOLE 500 MG/100ML IV SOLN: 500.0000 mg | Freq: Two times a day (BID) | INTRAVENOUS | Status: DC

## 2023-01-12 MED ORDER — ALPRAZOLAM 0.25 MG PO TABS: 0.2500 mg | ORAL_TABLET | Freq: Two times a day (BID) | ORAL | Status: DC

## 2023-01-12 MED ORDER — OXYCODONE HCL 5 MG PO TABS
5.0000 mg | ORAL_TABLET | ORAL | Status: DC | PRN
  Administered 2023-01-12 – 2023-01-13 (×4): 10 mg via ORAL
  Filled 2023-01-12 (×4): qty 2

## 2023-01-12 MED ORDER — CIPROFLOXACIN IN D5W 400 MG/200ML IV SOLN: 400.0000 mg | Freq: Two times a day (BID) | INTRAVENOUS | Status: DC

## 2023-01-12 MED ORDER — FERROUS SULFATE 300 (60 FE) MG/5ML PO SOLN
300.0000 mg | Freq: Two times a day (BID) | ORAL | Status: DC
  Administered 2023-01-12 – 2023-01-15 (×7): 300 mg via ORAL
  Filled 2023-01-12 (×7): qty 5

## 2023-01-12 NOTE — Progress Notes (Signed)
1800 patient ambulated to bathroom NG tube movement made patient vomit per pateint she has a weak gag reflex.

## 2023-01-12 NOTE — Progress Notes (Signed)
   01/12/23 1750  Assess: MEWS Score  Temp 97.9 F (36.6 C)  BP 108/75  MAP (mmHg) 86  Pulse Rate (!) 122  Resp 20  SpO2 100 %  Assess: MEWS Score  MEWS Temp 0  MEWS Systolic 0  MEWS Pulse 2  MEWS RR 0  MEWS LOC 0  MEWS Score 2  MEWS Score Color Yellow  Assess: SIRS CRITERIA  SIRS Temperature  0  SIRS Pulse 1  SIRS Respirations  0  SIRS WBC 0  SIRS Score Sum  1   Prior yellow

## 2023-01-12 NOTE — Progress Notes (Addendum)
0740 patient alert and orientated on room air asking for pain medications and used bed pan patient educated on increase in activity and eating orally for wound healing and to get off of TPN and Tube feeding 1046 patient leaving floor for swallow exam alert x4 on room air IV pump and feeding pump taken with patient, feeding pump has zero back up battery and shut off when unplugged

## 2023-01-12 NOTE — Evaluation (Signed)
Modified Barium Swallow Study  Patient Details  Name: [name] MRN: [id number] Date of Birth: 1975/12/16  Today's Date: 01/12/2023  Modified Barium Swallow completed.  Full report located under Chart Review in the Imaging Section.  History of Present Illness Per CCM note "47 year old female with past medical history of recurrent breast cancer not currently on treatment, Crohn's disease s/p subtotal colectomy in 2021 recurrent obstructions, colonic stricture, IDA, recent hospitalization 8/10-8/15/2024 for obstruction seen by GI and general surgery. Underwent EGD and flexible sigmoidoscopy and discharged on steroids who presented to the emergency department on 12/12/2022 with abdominal pain, nausea, vomiting diarrhea. In the ED tachycardic, hypertensive. ED provider notes abdominal distention. She was given 2L IVF, morphine, IV PPI. Labs with WBC 10.8, Hgb 10.7, K 3.2. CT AP was ordered and showed markedly dilated distal small bowel consistent with chronic obstruction. 12/13/2022 patient was found on the floor in the bathroom in ED pulseless. They estimate downtime around 5 minutes. ACLS was initiated. Concern for possible torsades de pointes and given IV mag, defibrillated. Coded for around 5 minutes. She was intubated. She had copious vomitus in the ETT. ROSC achieved and ICU was consulted. Notably, she had I-stat peri-code which showed hemoglobin of 5.5 down from 10 overnight."  Pt was extubated 12/18/2021.  Pt has been maintained on TPN and is now getting trickle feeds for nutrition. SLP following for dysphagia management.  Pt dislikes puree foods per notes on 9/23 and diet was advanced to mechanical soft on 9/24.  Intake remains compromised as pt reports feeling full due to overnight feeds - therefore tube feeding stopped per surgery.  She is continuing to have fevers but denies worsening pain - ID on board.  Pt has remained aphonic since intubation 9/4 - and extubation on 9/10.   Clinical  Impression Pt continues to make progress with swallowing function, demonstrating improvements in lingual control during bolus hold, laryngeal elevation, and epiglottic inversion. This improves her efficiency, as she has complete pharyngeal clearance, as well as her airway protection. With thin liquids in isolation, she has trace, transient penetration (PAS 2), but she did aspiration when trying to swallow the barium tablet with a liquid wash. The thin liquids were aspirated due to mistiming and decreased laryngeal vestibule closure, and aspiration was sensed but not cleared (PAS 7). Note that the barium tablet also remained in the pt's UES, making it difficult for additional thin liquid wash to enter her esophagus and additional sensed aspiration occurred. A bite of puree helpd move the tablet further into her esophagus, and she needed a sip of nectar thick liquids to move it beyond that point. Pt is not sure if she wants to advance her solid diet at this time, and she does report occasional symptoms similar to what was observed with the barium tablet. Therefore will leave on Dys 3 diet but advance to thin liquids, avoiding mixed consistencies and taking small, single sips via cup. Would continue to offer meds in puree, maybe crushing ones that are larger in size. Pt can likely take per her preference given that she was pretty sensate to difficulty on MBS and can verbalize precautions to take if she has trouble clinically.  Factors that may increase risk of adverse event in presence of aspiration [name] 2021): Poor general health and/or compromised immunity;Respiratory or GI disease;Limited mobility;Weak cough;Presence of tubes (ETT, trach, NG, etc.)  Swallow Evaluation Recommendations Recommendations: PO diet PO Diet Recommendation: Dysphagia 3 (Mechanical soft);Thin liquids (Level 0) Liquid Administration  via: Cup;No straw Medication Administration: Whole meds with puree (crush larger  ones) Supervision: Patient able to self-feed;Intermittent supervision/cueing for swallowing strategies;Set-up assistance for safety Swallowing strategies  : Minimize environmental distractions;Slow rate;Small bites/sips;Avoid mixed consistencies Postural changes: Position pt fully upright for meals Oral care recommendations: Oral care BID (2x/day)      Mahala Menghini., M.A. CCC-SLP Acute Rehabilitation Services Office [phone]  Secure chat preferred  01/12/2023,11:42 AM

## 2023-01-12 NOTE — Progress Notes (Addendum)
Drain Location: RLQ Size: Fr size: 10 Fr Date of placement: 10/3 Currently to: Drain collection device: suction bulb 24 hour output:   Output by Drain (mL) 01/10/23 0701 - 01/10/23 1900 01/10/23 1901 - 01/11/23 0700 01/11/23 0701 - 01/11/23 1900 01/11/23 1901 - 01/12/23 0700 01/12/23 0701 - 01/12/23 1610  Closed System Drain Lateral RLQ   22 40 25   Patient alert in bed. RLQ Abdominal bulb drain flushes without resistance. Drain charged with serosanguinous drainage. Dressing clean and dry. Abdomen tender to palpation   Interval imaging/drain manipulation:    Current examination: Flushes/aspirates easily.  Insertion site unremarkable. Suture and stat lock in place. Dressed appropriately.   Plan: Continue TID flushes with 5 cc NS. Record output Q shift. Dressing changes QD or PRN if soiled.  Call IR APP or on call IR MD if difficulty flushing or sudden change in drain output.  Repeat imaging/possible drain injection once output < 10 mL/QD (excluding flush material). Consideration for drain removal if output is < 10 mL/QD (excluding flush material), pending discussion with the providing surgical service.  Discharge planning: Please contact IR APP or on call IR MD prior to patient d/c to ensure appropriate follow up plans are in place. Typically patient will follow up with IR clinic 10-14 days post d/c for repeat imaging/possible drain injection. IR scheduler will contact patient with date/time of appointment. Patient will need to flush drain QD with 5 cc NS, record output QD, dressing changes every 2-3 days or earlier if soiled.   IR will continue to follow - please call with questions or concerns.  Patient ID: [name], female   DOB: 1975/06/21, 47 y.o.   MRN: [id number]

## 2023-01-12 NOTE — Progress Notes (Signed)
PHARMACY - TOTAL PARENTERAL NUTRITION CONSULT NOTE   Indication: Short bowel syndrome  Patient Measurements: Height: 5\' 4"  (162.6 cm) Weight: 51 kg (112 lb 7 oz) IBW/kg (Calculated) : 54.7 TPN AdjBW (KG): 57.8 Body mass index is 19.3 kg/m. Usual Weight: 46.3 kg from 8/28  Assessment: 47 y.o. female with PMH of recurrent breast cancer not currently on treatment, Crohn's disease s/p subtotal colectomy in 2021, recurrent obstructions, colonic stricture, recent hospitalization 8/10-8/15/2024 for obstruction who presented on 12/12/2022 with abdominal pain, nausea, vomiting, diarrhea.    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 9/26 Line holiday for persistent fevers, elevated WBC 9/30 Ok to place new PICC, 10/1 line placed and TPN resumed  Glucose / Insulin: no Hx DM -Steroids PTA for Crohn's disease; weaned off as of 9/13 -CBGs 98-144 -SSI: 1 unit given in past 24hrs Electrolytes: Na low at 132 and magnesium at 1.5; all others electrolytes WNL including CorrCa of 9.8, Slight trend up of K+ to 4.7 Renal: WNL, BUN elevated at 30 Hepatic: LFTs WNL. Albumin low, TG ok I/O:  - UOP: + 2 unmeasured urine occurrences -Ileostomy 1025  mL GI Imaging:  -CTA 9/10 shows new PE, but also abdominal ascites -CT Abd: Large volume ascites GI Surgeries / Procedures:  -OR 9/5 ex lap, internal hernia w/ small bowel ischemia, SBR & celiotomy; > 105 cm small bowel remaining -OR 9/7 for closure, ostomy creation -9/17: Paracentesis -9/23 paracentesis   Central access: powerline 10/1 Triple lumen CVC R subclavian 9/6, PAC 05/02/22 (REMOVED) TPN start date: 12/17/22 Held 9/26-9/30 for line  holiday Resumed 10/1  Nutritional Goals: TPN provides 94 g protein and 1680 kcal per day  RD Assessment: Estimated Needs Total Energy Estimated Needs: 1600-1800 Total Protein Estimated Needs: 85-95 grams Total Fluid Estimated Needs: 1.6L/day  Current Nutrition:  Cyclic TPN (18:00-06:00) Diet: DYS 3 with nectar thickened liquids [employer] 1.4 orally BID, each supplement provides 455 kcal and 20 grams protein   Plan: -Continue cyclic TPN over 12 hr (18:00- 06:00) -Electrolytes in TPN: Na inc to 100 mEq/L, K dec to 40 mEq/L, Ca 5 mEq/L, Mg inc to 10 mEq/L, Phos 15 mmol/L. Cl:Ac ratio 1:1 -Add standard MVI and trace elements to TPN -Replacing vit D per dietician recommendation per MD order -Continue sensitive SSI &  CBG checks 4x daily while on cyclic TPN (1-2 hr after completing ramp-up, on TPN, 1 hr after TPN stopped, off TPN) - CMP, Mag, Phos tomorrow AM -Monitor TPN labs on Mon/Thurs    Adalberto Cole, PharmD, BCPS 01/12/2023 11:12 AM

## 2023-01-12 NOTE — Progress Notes (Signed)
   01/12/23 0740  Assess: MEWS Score  Level of Consciousness Alert  O2 Device Room Air  Patient Activity (if Appropriate) In bed  Assess: MEWS Score  MEWS Temp 0  MEWS Systolic 0  MEWS Pulse 2  MEWS RR 0  MEWS LOC 0  MEWS Score 2  MEWS Score Color Yellow   Prior yellow

## 2023-01-12 NOTE — Consult Note (Addendum)
WOC Nurse Consult Note: Vac reading "blockage" related to small amt old clotted blood in the tubing. Removed sponge and assessed with surgical PA.  Full thickness midline abd post-op wound is red and moist and shallow, Vac ordered to be d/ced. Applied moist gauze dressing.    WOC Nurse ostomy follow up Stoma type/location: LLQ ileostomy  Stomal assessment/size: 1 1/4" well budded, pink moist round.  Wound is in close proximity. Output: mod amt liquid green effluent in high output pouch to bedside drainage bag.  Applied barrier ring and 2 piece pouch. Pt watched the procedure and asked appropriate questions.   Extra supplies in the room for staff nurses use.  Use supplies: skin barrier [employer] [place] [phone]), 2 1/4" high output pouch [employer] [place] [phone]) and 2" barrier ring [employer] [place] [phone])  WOC team will continue teaching sessions next week.  Enrolled patient in [place] Secure Start Discharge program: Yes previously. Thank-you,  Cammie Mcgee MSN, RN, CWOCN, [place], CNS [phone]

## 2023-01-12 NOTE — Progress Notes (Signed)
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 Active Problems:   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   Malignant neoplasm of upper-outer quadrant of right breast in female, estrogen receptor positive (HCC)   Hypokalemia   Port-A-Cath in place   Breast cancer (HCC)   Crohn's disease of both small and large intestine with intestinal obstruction (HCC)   Leukocytosis   Chronic disease anemia   Protein-calorie malnutrition, severe (HCC)   Failure to thrive in adult   Anorexia   Cardiac arrest (HCC)   Septic shock (HCC)   SBO with strangulation s/p resection 12/14/2022   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  Underwent ex lap on 9/5 for bowel resection due to bowel ischemia # Ex lap, ileostomy and abdominal closure on 9/7 - She developed shortness and high output ostomy.  Continued fever and leukocytosis. - CT 9/16 showed large amount of ascites.  She had been on vancomycin and Zosyn at that point ID was engaged.  Micafungin was added on 9/17 for possible fungemia. - Patient has continued to feed her on ciprofloxacin, metronidazole and micafungin.  Blood and fungal cultures have been no growth so far form 9/18 9/7 paracentesis with no growth. - On 9/22 CT abdomen pelvis there is concern for early developing obstruction/peritonitis.  Repeat paracentesis on 9/23.  General surgery engaged and noted that  abdominal exam is not impressive, may need repeat blood cultures source could be lines or urine.  Repeat scan if no other source Thursday or Friday. - Patient does not have urinary symptoms.  As previously noted blood cultures and fungal cultures from 9/18 are with no growth. -As pt continues to fever would recommend all lines to remove any sources. Of note I suspect a developing obstruction/peritonitis may be contributing to fever and leukocytosis as well.  Recommendations: -PICC line was removed on 9/26, repeat blood cultures 9/26 NG - Port was removed on 9/27 with general surgery. 72 hr line holiday and CVC placed on 10/1 - CT AP sowed Large central fluid collection measuring 19 x 6 cm, similar to prior study.Conveyed results to general surgery->IR consulted and pt underwent drain placement  with 100 ml serosanguinous aspiration, Cx pending. If pt discharged prior to Cx being final, please have [facility] relay final cx. -Can go on cipro+ metro, anticipate 1 mont of abx form aspiration -F/U with ID, Marcos Eke on 10/16 for labs and medication tolerance. -F/U with myself on 11/11  ID will sign off Microbiology:   Antibiotics: Ciprofloxacin, metronidazole, micafungin Cultures: Blood 9/18 fungal blood cultures no growth 9/26 Bcx NG 9/30 Bcx NG  SUBJECTIVE: Resting in bed.  No new complaints. Interval: Afebrile overnight, WBC 26K Review of Systems: Review of Systems  All other systems reviewed and are negative.    Scheduled Meds:  ALPRAZolam  0.25 mg Oral BID   ascorbic acid  500 mg Oral BID   diphenoxylate-atropine  2 tablet Oral QID   famotidine  20 mg Oral Daily   feeding supplement (KATE [employer]ARMS STANDARD 1.4)  325 mL Oral BID BM   feeding supplement (K[employer]MS STANDARD 1.4)  360 mL Oral Q24H   ferrous sulfate  300 mg Oral BID   fiber  1 packet Oral TID   gabapentin  200 mg Oral Q8H   insulin aspart  0-9 Units Subcutaneous 4 times per day   loperamide HCl  4 mg Oral QID    metoprolol tartrate  50 mg Oral BID   mouth rinse  15 mL Mouth Rinse 4 times per day   sodium chloride flush  10-40 mL Intracatheter Q12H   sodium chloride flush  5 mL Intracatheter Q8H   Vitamin D (Ergocalciferol)  50,000 Units Oral Q7 days   Continuous Infusions:  ciprofloxacin Stopped (01/12/23 1035)   heparin 1,800 Units/hr (01/12/23 1832)   metronidazole Stopped (01/12/23 1140)   promethazine (PHENERGAN) injection (IM or IVPB)     TPN CYCLIC-ADULT (ION) 153 mL/hr at 01/12/23 2000   PRN Meds:.acetaminophen, albuterol, artificial tears, fentaNYL (SUBLIMAZE) injection, food thickener, labetalol, lip balm, LORazepam, [DISCONTINUED] ondansetron **OR** ondansetron (ZOFRAN) IV, mouth rinse, mouth rinse, mouth rinse, mouth rinse, oxyCODONE, promethazine (PHENERGAN) injection (IM or IVPB), silver nitrate applicators, sodium chloride flush Allergies  Allergen Reactions   Nsaids Other (See Comments)    Crohns disease/ IBD   Chlorhexidine Gluconate Itching    Able to use CHG swabs for ports, reaction with direct skin contact   Hydromorphone Other (See Comments)    Confusion/sedation   Lactose Intolerance (Gi) Other (See Comments)    Upset stomach/diarrhea    OBJECTIVE: Vitals:   01/12/23 1326 01/12/23 1750 01/12/23 2012 01/12/23 2012  BP: 101/73 108/75 113/76   Pulse: (!) 110 (!) 122 (!) 119   Resp: 20 20 20    Temp: 98.6 F (37 C) 97.9 F (36.6 C) 98.2 F (36.8 C) 98.2 F (36.8 C)  TempSrc: Oral Oral  Oral  SpO2: 100% 100% 100%   Weight:      Height:       Body mass index is 19.3 kg/m.  Physical Exam Constitutional:      Appearance: Normal appearance.  HENT:     Head: Normocephalic and atraumatic.     Right Ear: Tympanic membrane normal.     Left Ear: Tympanic membrane normal.     Nose: Nose normal.     Mouth/Throat:     Mouth: Mucous membranes are moist.  Eyes:     Extraocular Movements: Extraocular movements intact.     Conjunctiva/sclera: Conjunctivae normal.      Pupils: Pupils are equal, round, and reactive to light.  Cardiovascular:     Rate and Rhythm: Normal rate and regular rhythm.     Heart sounds: No murmur heard.    No friction rub. No gallop.     Comments: Port out Pulmonary:     Effort: Pulmonary effort is normal.     Breath sounds: Normal breath sounds.  Abdominal:     Comments: Abdominal wound  Musculoskeletal:        General: Normal range of motion.  Skin:    General: Skin is warm and dry.  Neurological:     General: No focal deficit present.     Mental Status: She is alert and oriented to person, place, and time.  Psychiatric:        Mood and Affect: Mood normal.  Lab Results Lab Results  Component Value Date   WBC 26.5 (H) 01/12/2023   HGB 7.5 (L) 01/12/2023   HCT 24.1 (L) 01/12/2023   MCV 91.3 01/12/2023   PLT 411 (H) 01/12/2023    Lab Results  Component Value Date   CREATININE 0.72 01/12/2023   BUN 30 (H) 01/12/2023   NA 132 (L) 01/12/2023   K 4.7 01/12/2023   CL 102 01/12/2023   CO2 23 01/12/2023    Lab Results  Component Value Date   ALT 21 01/12/2023   AST 28 01/12/2023   ALKPHOS 113 01/12/2023   BILITOT 0.4 01/12/2023        Danelle Earthly, MD [facility] 01/12/2023, 9:18 PM I have personally spent 51 minutes involved in face-to-face and non-face-to-face activities for this patient on the day of the visit. Professional time spent includes the following activities: Preparing to see the patient (review of tests), Obtaining and/or reviewing separately obtained history (admission/discharge record), Performing a medically appropriate examination and/or evaluation , Ordering medications/tests/procedures, referring and communicating with other health care professionals, Documenting clinical information in the EMR, Independently interpreting results (not separately reported), Communicating results to the patient/family/caregiver, Counseling and  educating the patient/family/caregiver and Care coordination (not separately reported).

## 2023-01-12 NOTE — Progress Notes (Signed)
   01/12/23 1017  Assess: MEWS Score  Temp 98.6 F (37 C)  BP 98/71  MAP (mmHg) 80  Pulse Rate (!) 104  Resp 16  SpO2 100 %  Assess: MEWS Score  MEWS Temp 0  MEWS Systolic 1  MEWS Pulse 1  MEWS RR 0  MEWS LOC 0  MEWS Score 2  MEWS Score Color Yellow  Assess: SIRS CRITERIA  SIRS Temperature  0  SIRS Pulse 1  SIRS Respirations  0  SIRS WBC 0  SIRS Score Sum  1   Prior yellow

## 2023-01-12 NOTE — Progress Notes (Signed)
ANTICOAGULATION CONSULT NOTE  Pharmacy Consult for heparin Indication: pulmonary embolus  Allergies  Allergen Reactions   Nsaids Other (See Comments)    Crohns disease/ IBD   Chlorhexidine Gluconate Itching    Able to use CHG swabs for ports, reaction with direct skin contact   Hydromorphone Other (See Comments)    Confusion/sedation   Lactose Intolerance (Gi) Other (See Comments)    Upset stomach/diarrhea    Patient Measurements: Height: 5\' 4"  (162.6 cm) Weight: 51 kg (112 lb 7 oz) IBW/kg (Calculated) : 54.7 Heparin Dosing Weight: TBW  Vital Signs: Temp: 99 F (37.2 C) (10/04 0555) Temp Source: Oral (10/04 0555) BP: 111/80 (10/04 0555) Pulse Rate: 129 (10/04 0555)  Labs: Recent Labs    01/10/23 0255 01/11/23 0357 01/11/23 1143 01/12/23 0601  HGB 8.3* 7.7*  --  7.5*  HCT 27.2* 25.5*  --  24.1*  PLT 458* 425*  --  411*  LABPROT  --   --  15.8*  --   INR  --   --  1.2  --   HEPARINUNFRC 0.56 0.55  --  0.34  CREATININE 0.85 0.76  --  0.72    Estimated Creatinine Clearance: 71.5 mL/min (by C-G formula based on SCr of 0.72 mg/dL).   Medications:  No AC PTA.  Assessment: 47 yo F with a PMH of recurrent breast cancer not currently on treatment, who presented to the ED on 12/12/2022 with abdominal pain, nausea, vomiting diarrhea.   Patient was placed on heparin drip from 9/10-9/12 for new LLL PE. On 9/12 heparin drip was discontinued due to hemoglobin dropping and intermittent bleeding in ostomy.   On 9/21: Pharmacy has been consulted to resume heparin drip after transfusion (Hgb 7, transfused 1 unit PRBC) On 9/27 heparin held for [facility], resumed w/ no bolus at 13:00  01/12/2023 - Heparin level 0.34 - therapeutic with heparin infusion at 1800 units/hr - Hgb = 7.5, low but stable - Plt = 411, elevated - No bleeding or line issues reported per RN     Goal of Therapy:  Heparin level 0.3-0.7 units/ml Monitor platelets by anticoagulation protocol: Yes   Plan:   Continue heparin infusion at 1800 units/hr  Daily CBC & Heparin level Monitor for signs/symptoms of bleeding Follow up long-term anticoagulation plans     Adalberto Cole, PharmD, BCPS 01/12/2023 8:50 AM

## 2023-01-12 NOTE — Plan of Care (Signed)
  Problem: Education: Goal: Ability to describe self-care measures that may prevent or decrease complications (Diabetes Survival Skills Education) will improve Outcome: Progressing Goal: Individualized Educational Video(s) Outcome: Progressing   Problem: Coping: Goal: Ability to adjust to condition or change in health will improve Outcome: Progressing   Problem: Fluid Volume: Goal: Ability to maintain a balanced intake and output will improve Outcome: Progressing   Problem: Health Behavior/Discharge Planning: Goal: Ability to identify and utilize available resources and services will improve Outcome: Progressing Goal: Ability to manage health-related needs will improve Outcome: Progressing   Problem: Metabolic: Goal: Ability to maintain appropriate glucose levels will improve Outcome: Progressing   Problem: Nutritional: Goal: Maintenance of adequate nutrition will improve Outcome: Not Progressing Goal: Progress toward achieving an optimal weight will improve Outcome: Not Progressing   Problem: Skin Integrity: Goal: Risk for impaired skin integrity will decrease Outcome: Progressing   Problem: Tissue Perfusion: Goal: Adequacy of tissue perfusion will improve Outcome: Progressing   Problem: Education: Goal: Knowledge of General Education information will improve Description: Including pain rating scale, medication(s)/side effects and non-pharmacologic comfort measures Outcome: Progressing   Problem: Health Behavior/Discharge Planning: Goal: Ability to manage health-related needs will improve Outcome: Progressing   Problem: Clinical Measurements: Goal: Ability to maintain clinical measurements within normal limits will improve Outcome: Progressing Goal: Will remain free from infection Outcome: Progressing Goal: Diagnostic test results will improve Outcome: Progressing Goal: Respiratory complications will improve Outcome: Progressing Goal: Cardiovascular  complication will be avoided Outcome: Progressing   Problem: Activity: Goal: Risk for activity intolerance will decrease Outcome: Not Progressing Note: Patient not wanting to mobilize    Problem: Nutrition: Goal: Adequate nutrition will be maintained Outcome: Not Progressing Note: Patient not wanting to eat   Problem: Coping: Goal: Level of anxiety will decrease Outcome: Progressing   Problem: Elimination: Goal: Will not experience complications related to bowel motility Outcome: Progressing Goal: Will not experience complications related to urinary retention Outcome: Progressing   Problem: Pain Managment: Goal: General experience of comfort will improve Outcome: Progressing   Problem: Safety: Goal: Ability to remain free from injury will improve Outcome: Progressing   Problem: Skin Integrity: Goal: Risk for impaired skin integrity will decrease Outcome: Progressing   Problem: Fluid Volume: Goal: Hemodynamic stability will improve Outcome: Not Progressing Note: Patient dumping from ostomy    Problem: Clinical Measurements: Goal: Diagnostic test results will improve Outcome: Progressing Goal: Signs and symptoms of infection will decrease Outcome: Progressing   Problem: Respiratory: Goal: Ability to maintain adequate ventilation will improve Outcome: Progressing

## 2023-01-12 NOTE — Progress Notes (Signed)
Occupational Therapy Treatment Patient Details Name: [name] MRN: [id number] DOB: [birth year] Today's Date: 01/12/2023   History of present illness Patient is a 47 year old female who presented on 9/3 with abdominal distension and nausea/vomiting. 9/4 patient was found in ED bathroom with no pulse with ROSC in about 5 mins. Patient was intubated at this time. 9/5 patient underwent exploratory laparotomy that revealed internal hernia with associated small bowel ischemia with patient undergoing bowel resection. 9/7 patient returned to OR for ostomy placement. Patient was extubated on 9/10 and also found to have PE. PMH: breast cancer, Chron's disease, IDA, recent hospitalization 8/10 to 8/15 with bowel obstruction and EGD   OT comments  Pt was motivated to participate in the session. She was assisted into sitting EOB where she performed simple upper body grooming with set-up assist. She further required min assist to stand using a RW; she was able to perform 2 total stands, as well as demo marching in place then taking lateral steps towards the head of the bed. She reported 4/10 abdominal pain with activity. She is making functional progress. Continue OT plan of care. Patient will benefit from intensive inpatient follow up therapy, >3 hours/day.       If plan is discharge home, recommend the following:  Direct supervision/assist for medications management;Assistance with cooking/housework;Assist for transportation;Help with stairs or ramp for entrance;A lot of help with bathing/dressing/bathroom;A little help with walking and/or transfers   Equipment Recommendations  None recommended by OT    Recommendations for Other Services      Precautions / Restrictions Precautions Precautions: Fall Restrictions Weight Bearing Restrictions: No Other Position/Activity Restrictions: abdominal precautions, ostomy       Mobility Bed Mobility Overal bed mobility: Needs Assistance Bed Mobility:  Supine to Sit, Rolling, Sit to Supine Rolling: Min assist, Used rails; log roll technique implemented     Sit to supine: Mod assist (required assist for BLE)        Transfers Overall transfer level: Needs assistance Equipment used: Rolling walker (2 wheels) Transfers: Sit to/from Stand Sit to Stand: Min assist                     ADL either performed or assessed with clinical judgement   ADL   Eating/Feeding: Set up;Sitting Eating/Feeding Details (indicate cue type and reason): based on clinical judgement Grooming: Set up;Sitting Grooming Details (indicate cue type and reason): She performed face and hand washing seated EOB.         Upper Body Dressing : Minimal assistance;Sitting Upper Body Dressing Details (indicate cue type and reason): simulated seated EOB Lower Body Dressing: Moderate assistance;Sitting/lateral leans       Toileting- Clothing Manipulation and Hygiene: Moderate assistance Toileting - Clothing Manipulation Details (indicate cue type and reason): at bedside commode level, based on clinical judgement             Vision Baseline Vision/History: 1 Wears glasses            Cognition Arousal: Alert Behavior During Therapy: WFL for tasks assessed/performed Overall Cognitive Status: Within Functional Limits for tasks assessed                              Pertinent Vitals/ Pain       Pain Assessment Pain Assessment: 0-10 Pain Score: 4  Pain Location: abdomen Pain Intervention(s): Limited activity within patient's tolerance, Monitored during session, Repositioned  Frequency  Min 1X/week        Progress Toward Goals  OT Goals(current goals can now be found in the care plan section)  Progress towards OT goals: Progressing toward goals  Acute Rehab OT Goals OT Goal Formulation: With patient Time For Goal Achievement: 01/26/23 Potential to Achieve Goals: Good  Plan         AM-PAC OT "6 Clicks" Daily  Activity     Outcome Measure   Help from another person eating meals?: A Little Help from another person taking care of personal grooming?: A Little Help from another person toileting, which includes using toliet, bedpan, or urinal?: A Lot Help from another person bathing (including washing, rinsing, drying)?: A Lot Help from another person to put on and taking off regular upper body clothing?: A Little Help from another person to put on and taking off regular lower body clothing?: A Lot 6 Click Score: 15    End of Session Equipment Utilized During Treatment: Rolling walker (2 wheels);Other (comment)  OT Visit Diagnosis: Unsteadiness on feet (R26.81);Pain;Muscle weakness (generalized) (M62.81) Pain - part of body:  (abdomen)   Activity Tolerance Patient tolerated treatment well   Patient Left in bed;with call bell/phone within reach;with bed alarm set   Nurse Communication Mobility status        Time: 1478-2956 OT Time Calculation (min): 26 min  Charges: OT General Charges $OT Visit: 1 Visit OT Treatments $Self Care/Home Management : 8-22 mins $Therapeutic Activity: 8-22 mins     Reuben Likes, OTR/L 01/12/2023, 5:09 PM

## 2023-01-12 NOTE — Discharge Summary (Addendum)
implant noted. Hepatobiliary: No focal liver abnormality is seen. No gallstones, gallbladder wall thickening, or biliary dilatation. Pancreas: Mild dilation of the pancreatic duct noted. No obstructing lesion is present. Spleen: Normal in size without focal abnormality. Adrenals/Urinary Tract: The adrenal glands are normal bilaterally. Kidneys are unremarkable. No stone or mass lesion is present. No obstruction is present. A Foley catheter is present within the urinary bladder. Stomach/Bowel: The small bore feeding tube is in place. The tip is in the distal duodenum. Dilated loops of proximal small bowel measure up to 3.7 cm. Just distal to the dilated segment marked small bowel wall thickening is present which continues to the level of the left lower quadrant ileostomy. Extensive small bowel resection and partial colectomy noted. Vascular/Lymphatic: The aorta is unremarkable. Subcentimeter periaortic lymph nodes are similar the prior exam, likely reactive. Are. Reproductive: Fibroid uterus is again noted.  The adnexa Other: Diffuse peritoneal thickening is now present. Locules of gas are present in the right lower quadrant. A small amount of  free air remains. Musculoskeletal: The vertebral body heights and alignment are normal. No focal osseous abnormality is present. IMPRESSION: 1. Dilated loops of proximal small bowel measure up to 3.7 cm. Just distal to the dilated segment marked small bowel wall thickening is marked small bowel wall thickening which continues to the level of the left lower quadrant ileostomy. This raises concern for early developing obstruction. Findings are also concerning for bowel ischemia in this patient previous ischemic bowel. 2. Diffuse peritoneal thickening is concerning for peritonitis. 3. Persistent pneumoperitoneum is unexpected 15 days after surgery, raising concern infection. 4. Moderate right-sided pleural effusion is again seen. Left pleural effusion is improved. 5. Focal airspace consolidation at the left base is concerning for pneumonia. 6. Fibroid uterus. These results were called by telephone at the time of interpretation on 12/31/2022 at 11:04 am to provider [facility] , who verbally acknowledged these results. Electronically Signed   By: Marin Roberts M.D.   On: 12/31/2022 11:19   VAS [place] UPPER EXTREMITY VENOUS DUPLEX  Result Date: 12/30/2022 UPPER VENOUS STUDY  Patient Name:  [name]  Date of Exam:   12/28/2022 Medical Rec #: [id number]         Accession #:    [id number] Date of Birth: Jul 26, 1975         Patient Gender: F Patient Age:   47 years Exam Location:  [facility] Procedure:      VAS [place] UPPER EXTREMITY VENOUS DUPLEX Referring Phys: JEFFREY HATCHER --------------------------------------------------------------------------------  Indications: PE not on anticoagulation Limitations: Line and port placement. Comparison Study: No previous UE exams Performing Technologist: Jody Hill RVT, RDMS  Examination Guidelines: A complete evaluation includes B-mode imaging, spectral Doppler, color Doppler, and power Doppler as needed of all accessible portions of each vessel. Bilateral  testing is considered an integral part of a complete examination. Limited examinations for reoccurring indications may be performed as noted.  Right Findings: +----------+------------+---------+-----------+----------+--------------+ RIGHT     CompressiblePhasicitySpontaneousProperties   Summary     +----------+------------+---------+-----------+----------+--------------+ IJV           Full       Yes       Yes                             +----------+------------+---------+-----------+----------+--------------+ Subclavian  However, portions of this examination were limited- see technologist comments above.  - No cystic structure found in the popliteal fossa.  *See table(s) above for measurements and observations. Electronically signed by Gerarda Fraction on 12/21/2022 at 2:58:08 PM.    Final    ECHOCARDIOGRAM LIMITED  Result Date: 12/20/2022    ECHOCARDIOGRAM LIMITED REPORT   Patient Name:   [name] Date of Exam: 12/20/2022 Medical Rec #:  [id number]        Height:       64.0 in Accession #:    [id number]       Weight:       140.4 lb Date of Birth:  1976/04/10        BSA:          1.683 m Patient Age:    47 years         BP:           170/111 mmHg Patient Gender: F                HR:           120 bpm. Exam Location:  Inpatient Procedure: Cardiac Doppler, Limited Echo and Limited Color Doppler Indications:    Pulmonary Embolus I26.09  History:        Patient has prior history of Echocardiogram examinations, most                 recent 12/15/2022. Breast Cancer, Arrythmias:Cardiac Arrest; Risk                 Factors:Former Smoker.  Sonographer:    Aron Baba Referring Phys: [id number] Cristopher Peru  Sonographer Comments: No apical window and suboptimal subcostal window. Image acquisition challenging due to breast implants. IMPRESSIONS  1. Left ventricular ejection fraction, by estimation, is 60 to 65%. The left ventricle has normal function.  2. Right ventricular systolic function is normal. The right ventricular size is normal. Tricuspid regurgitation signal is inadequate for assessing PA pressure.  3. Aortic valve regurgitation is not visualized.  4.  The inferior vena cava is dilated in size with >50% respiratory variability, suggesting right atrial pressure of 8 mmHg. Conclusion(s)/Recommendation(s): No RV strain, no pulmonary hypertension. FINDINGS  Left Ventricle: Left ventricular ejection fraction, by estimation, is 60 to 65%. The left ventricle has normal function. There is no left ventricular hypertrophy. Right Ventricle: The right ventricular size is normal. Right ventricular systolic function is normal. Tricuspid regurgitation signal is inadequate for assessing PA pressure. Tricuspid Valve: Tricuspid valve regurgitation is trivial. Aortic Valve: Aortic valve regurgitation is not visualized. Pulmonic Valve: Pulmonic valve regurgitation is not visualized. Venous: The inferior vena cava is dilated in size with greater than 50% respiratory variability, suggesting right atrial pressure of 8 mmHg. Additional Comments: Spectral Doppler performed. Color Doppler performed.  LEFT VENTRICLE PLAX 2D LVIDd:         3.50 cm LVIDs:         2.10 cm LV PW:         0.90 cm LV IVS:        0.60 cm  LEFT ATRIUM         Index LA diam:    2.20 cm 1.31 cm/m Carolan Clines Electronically signed by Carolan Clines Signature Date/Time: 12/20/2022/12:08:08 PM    Final    CT Angio Chest Pulmonary Embolism (PE) W or WO Contrast  Result Date: 12/19/2022 CLINICAL DATA:  History of breast cancer and Crohn's disease. Chest pain.  small amount of pneumoperitoneum within the central  upper abdomen. This is nonspecific given recent surgical intervention. 3. Large volume ascites. 4. Bilateral pleural effusions with underlying compressive atelectasis, right greater than left. 5. Diffuse subcutaneous edema. These results were called by telephone at the time of interpretation on 12/25/2022 at 457 pm to provider Chevis Pretty, who verbally acknowledged these results. Electronically Signed   By: Sharlet Salina M.D.   On: 12/25/2022 17:03   DG Abd 1 View  Result Date: 12/23/2022 CLINICAL DATA:  NG tube placement. EXAM: ABDOMEN - 1 VIEW COMPARISON:  12/19/2022. FINDINGS: Enteric tube passes below the diaphragm, through the stomach into the right mid abdomen projecting the level of the duodenal bulb/pylorus. No bowel dilation. IMPRESSION: 1. Enteric feeding tube tip projects the level of the pylorus/duodenal bulb. Electronically Signed   By: Amie Portland M.D.   On: 12/23/2022 15:12   DG Swallowing Func-Speech Pathology  Result Date: 12/23/2022 Table formatting from the original result was not included. Modified Barium Swallow Study Patient Details Name: [name] MRN: [id number] Date of Birth: [birth year] Today's Date: 12/23/2022 HPI/PMH: HPI: Per CCM note "47 year old female with past medical history of recurrent breast cancer not currently on treatment, Crohn's disease s/p subtotal colectomy in 2021 recurrent obstructions, colonic stricture, IDA, recent hospitalization 8/10-8/15/2024 for obstruction seen by GI and general surgery. Underwent EGD and flexible sigmoidoscopy and discharged on steroids who presented to the emergency department on 12/12/2022 with abdominal pain, nausea, vomiting diarrhea. In the ED tachycardic, hypertensive. ED provider notes abdominal distention. She was given 2L IVF, morphine, IV PPI. Labs with WBC 10.8, Hgb 10.7, K 3.2. CT AP was ordered and showed markedly dilated distal small bowel consistent with chronic obstruction. No changes since prior study. Patient was  admitted to [facility]. They consulted GI for input. Unfortunately, around 0820 9/4 patient was found on the floor in the bathroom in ED pulseless. They estimate downtime around 5 minutes. ACLS was initiated. Concern for possible torsades de pointes and given IV mag, defibrillated. Coded for around 5 minutes. She was intubated. She had copious vomitus in the ETT. ROSC achieved and ICU was consulted. Notably, she had I-stat peri-code which showed hemoglobin of 5.5 down from 10 overnight."  Pt was extubated 12/18/2021. Clinical Impression: Clinical Impression: MBS was completed in the laterial projection using barium impregnated thin liquids via spoon, mildly thick liquid via spoon and cup and pureed material.  She presented with an oral and pharyngeal dysphagia.  Aspiration with a weak cough response was seen given thin liquids via spoon and mildly thick liquids via self fed cup sips.  Suggest patient remain NPO with comfort feeds of 1/2 tsp bites of pureed material and spoon sips of mildly thick liquids.  Oral care needs to be diligent and compeleted 2-3 t imes per day using a toothbrush and toothpaste.  ST will follow for swallowing therapy.  Please see below for more information regarding swallowing physiology.  ORAL PHASE  1.  Poor lingual control during directed bolus hold.  She was noted to lose material into the buccal area and/or under the tongue prior to the direction to swallow.  This lack of control led to premature loss of the bolus with thin and mildly thick liquids resonating in the pyriform sinuses and pureed material resonating in the vallecular prior to the swallow trigger.  2.  Repetitive/disorganized lingual motion was seen leading to a delay in A/P transport.    PHARYNGEAL PHASE  1.  Significantly reduced  However, portions of this examination were limited- see technologist comments above.  - No cystic structure found in the popliteal fossa.  *See table(s) above for measurements and observations. Electronically signed by Gerarda Fraction on 12/21/2022 at 2:58:08 PM.    Final    ECHOCARDIOGRAM LIMITED  Result Date: 12/20/2022    ECHOCARDIOGRAM LIMITED REPORT   Patient Name:   [name] Date of Exam: 12/20/2022 Medical Rec #:  [id number]        Height:       64.0 in Accession #:    [id number]       Weight:       140.4 lb Date of Birth:  1976/04/10        BSA:          1.683 m Patient Age:    47 years         BP:           170/111 mmHg Patient Gender: F                HR:           120 bpm. Exam Location:  Inpatient Procedure: Cardiac Doppler, Limited Echo and Limited Color Doppler Indications:    Pulmonary Embolus I26.09  History:        Patient has prior history of Echocardiogram examinations, most                 recent 12/15/2022. Breast Cancer, Arrythmias:Cardiac Arrest; Risk                 Factors:Former Smoker.  Sonographer:    Aron Baba Referring Phys: [id number] Cristopher Peru  Sonographer Comments: No apical window and suboptimal subcostal window. Image acquisition challenging due to breast implants. IMPRESSIONS  1. Left ventricular ejection fraction, by estimation, is 60 to 65%. The left ventricle has normal function.  2. Right ventricular systolic function is normal. The right ventricular size is normal. Tricuspid regurgitation signal is inadequate for assessing PA pressure.  3. Aortic valve regurgitation is not visualized.  4.  The inferior vena cava is dilated in size with >50% respiratory variability, suggesting right atrial pressure of 8 mmHg. Conclusion(s)/Recommendation(s): No RV strain, no pulmonary hypertension. FINDINGS  Left Ventricle: Left ventricular ejection fraction, by estimation, is 60 to 65%. The left ventricle has normal function. There is no left ventricular hypertrophy. Right Ventricle: The right ventricular size is normal. Right ventricular systolic function is normal. Tricuspid regurgitation signal is inadequate for assessing PA pressure. Tricuspid Valve: Tricuspid valve regurgitation is trivial. Aortic Valve: Aortic valve regurgitation is not visualized. Pulmonic Valve: Pulmonic valve regurgitation is not visualized. Venous: The inferior vena cava is dilated in size with greater than 50% respiratory variability, suggesting right atrial pressure of 8 mmHg. Additional Comments: Spectral Doppler performed. Color Doppler performed.  LEFT VENTRICLE PLAX 2D LVIDd:         3.50 cm LVIDs:         2.10 cm LV PW:         0.90 cm LV IVS:        0.60 cm  LEFT ATRIUM         Index LA diam:    2.20 cm 1.31 cm/m Carolan Clines Electronically signed by Carolan Clines Signature Date/Time: 12/20/2022/12:08:08 PM    Final    CT Angio Chest Pulmonary Embolism (PE) W or WO Contrast  Result Date: 12/19/2022 CLINICAL DATA:  History of breast cancer and Crohn's disease. Chest pain.  FLEXIBLE SIGMOIDOSCOPY;  Surgeon: Benancio Deeds, MD;  Location: [facility];  Service: Gastroenterology;  Laterality: N/A;  FLEXIBLE SIGMOIDOSCOPY N/A 09/07/2021  Procedure: FLEXIBLE SIGMOIDOSCOPY;  Surgeon: Lynann Bologna, MD;  Location: [facility];  Service: Gastroenterology;  Laterality: N/A;  FLEXIBLE SIGMOIDOSCOPY N/A 11/21/2022  Procedure: FLEXIBLE SIGMOIDOSCOPY;  Surgeon: Meridee Score Netty Starring., MD;  Location: [facility];  Service: Gastroenterology;  Laterality: N/A;  LAPAROTOMY N/A 12/14/2022  Procedure: EXPLORATORY LAPAROTOMY WITH SMALL BOWEL RESECTION;  Surgeon: Moise Boring, MD;  Location: [facility];  Service: General;  Laterality: N/A;  LAPAROTOMY N/A 12/16/2022  Procedure: REOPENING LAPAROTOMY, ILEOSTOMY;  ABDOMINAL CLOSURE;  Surgeon: Moise Boring, MD;  Location: [facility];  Service: General;  Laterality: N/A;  NIPPLE SPARING MASTECTOMY Bilateral 11/09/2020  Procedure: BILATERAL NIPPLE SPARING MASTECTOMY;  Surgeon: Emelia Loron, MD;  Location: [facility];  Service: General;  Laterality: Bilateral;  PORT-A-CATH REMOVAL N/A 07/25/2021  Procedure: REMOVAL PORT-A-CATH;  Surgeon: Allena Napoleon, MD;  Location: [facility];  Service: Plastics;  Laterality: N/A;  PORTACATH PLACEMENT N/A 06/23/2020  Procedure: INSERTION PORT-A-CATH;  Surgeon: Emelia Loron, MD;  Location: [facility];  Service: General;  Laterality: N/A;  START TIME OF 11:00 AM FOR 60 MINUTES [place] [facility]  PORTACATH PLACEMENT Left 05/02/2022  Procedure: INSERTION PORT-A-CATH;  Surgeon: Emelia Loron, MD;  Location: [facility];  Service: General;  Laterality: Left;  RADIOACTIVE SEED GUIDED EXCISIONAL BREAST BIOPSY Right 05/02/2022  Procedure: RADIOACTIVE SEED GUIDED RIGHT BREAST MASS EXCISION;  Surgeon: Emelia Loron, MD;  Location: [facility];  Service: General;  Laterality: Right;  REMOVAL OF BILATERAL TISSUE EXPANDERS WITH PLACEMENT OF BILATERAL BREAST IMPLANTS Bilateral 07/25/2021  Procedure: REMOVAL OF BILATERAL TISSUE EXPANDERS WITH PLACEMENT OF BILATERAL BREAST IMPLANTS;  Surgeon: Allena Napoleon, MD;  Location:  SURGERY CENTER;  Service: Plastics;  Laterality: Bilateral;  1.5 Dimas Aguas, MA, CCC-SLP Acute Rehab SLP [phone] Fleet Contras 12/23/2022, 2:54 PM  DG Chest Port 1 View  Result Date: 12/22/2022 CLINICAL DATA:  Acute respiratory failure, chest pain, crackles at right lung base. EXAM: PORTABLE CHEST 1 VIEW COMPARISON:  12/19/2022. FINDINGS: The heart size and mediastinal contours are stable. A left chest port and right central venous catheter terminates over the superior vena cava. Hazy opacities are noted in the mid to lower lung fields bilaterally suggesting moderate layering pleural effusions. Airspace disease is noted at the lung bases. No pneumothorax. An enteric tube terminates in the stomach. IMPRESSION: Hazy opacities in the mid to lower lung fields bilaterally likely reflecting moderate layering pleural effusions with atelectasis or infiltrate. Electronically Signed   By: Thornell Sartorius M.D.   On: 12/22/2022 04:45   VAS [place] LOWER EXTREMITY VENOUS (DVT)  Result Date: 12/21/2022  Lower Venous DVT Study Patient Name:  [name]  Date of Exam:   12/20/2022 Medical Rec #: [id number]         Accession #:    [id number] Date of Birth: [birth year]         Patient Gender: F Patient Age:   74 years Exam Location:  [facility] Procedure:      VAS [place] LOWER EXTREMITY VENOUS (DVT) Referring Phys: Mertha Baars  --------------------------------------------------------------------------------  Indications: Pulmonary embolism.  Risk Factors: Cancer. Limitations: Poor ultrasound/tissue interface, bandages and patient positioning, patient immobility. Comparison Study: 12/14/2022 - Negative for DVT. Performing Technologist: Chanda Busing RVT  Examination Guidelines: A complete evaluation includes B-mode imaging, spectral Doppler, color Doppler, and  Brachial      Full       Yes       Yes                                   +----------+------------+---------+-----------+----------+--------------------+ Radial        Full                                                       +----------+------------+---------+-----------+----------+--------------------+ Ulnar         Full                                                       +----------+------------+---------+-----------+----------+--------------------+ Cephalic      Full                                                       +----------+------------+---------+-----------+----------+--------------------+ Basilic       Full       Yes       Yes                                   +----------+------------+---------+-----------+----------+--------------------+ Limited visualization of subclavian vein due to port placement. Cephalic vein not well visualized due to size of vessel.  Summary: No evidence of deep vein or superficial vein thrombosis involving the right and left upper extremities. Right: However, unable to visualize the subclavian vein.  *See table(s) above for measurements and observations.  Diagnosing physician: Coral Else MD Electronically signed by Coral Else MD on 12/30/2022 at 10:08:06 AM.    Final    DG CHEST PORT 1 VIEW  Result Date:  12/27/2022 CLINICAL DATA:  Fever EXAM: PORTABLE CHEST 1 VIEW COMPARISON:  12/22/2022 FINDINGS: Enteric tube extends below the diaphragm with distal tip terminating within the third portion of the duodenum. Left chest port and right subclavian central venous catheter remain appropriately positioned. Normal heart size. Left greater than right bibasilar opacities with bilateral pleural effusions, slightly improved. No pneumothorax. IMPRESSION: Left greater than right bibasilar opacities with bilateral pleural effusions, slightly improved. Electronically Signed   By: Duanne Guess D.O.   On: 12/27/2022 13:43   VAS [place] LOWER EXTREMITY VENOUS (DVT)  Result Date: 12/27/2022  Lower Venous DVT Study Patient Name:  [name]  Date of Exam:   12/27/2022 Medical Rec #: [id number]         Accession #:    [id number] Date of Birth: 02-Oct-1975         Patient Gender: F Patient Age:   42 years Exam Location:  [facility] Procedure:      VAS [place] LOWER EXTREMITY VENOUS (DVT) Referring Phys: MICHAEL MACZIS --------------------------------------------------------------------------------  Indications: Pulmonary embolism.  Risk Factors: Confirmed PE. Limitations: Poor ultrasound/tissue interface, bandages, open wound and patient positioning. Comparison Study: 12/14/2022, 12/20/2022 - Negative for DVT. Performing Technologist: Chanda Busing  the stomach. Left-sided needle accessed port in place with the tip at the cavoatrial junction. No pleural effusion. No pneumothorax. No new focal airspace opacity. Slightly improved airspace opacities in the right mid lung field. Unchanged cardiac and mediastinal contours. No radiographically apparent displaced rib fractures. IMPRESSION: 1. Slightly improved airspace opacities in the right mid lung field. 2. Enteric tube side hole is near the GE junction. Recommend slight advancement. Electronically Signed   By: Lorenza Cambridge M.D.   On: 12/16/2022 11:14   DG CHEST PORT 1 VIEW  Result  Date: 12/15/2022 CLINICAL DATA:  Central line placement. EXAM: PORTABLE CHEST 1 VIEW COMPARISON:  Same day. FINDINGS: Interval placement of right subclavian catheter with distal tip in expected position of the SVC. No pneumothorax is noted. Stable bilateral lung opacities as noted on prior exam. IMPRESSION: Interval placement of right subclavian catheter with distal tip in expected position of the SVC. Electronically Signed   By: Lupita Raider M.D.   On: 12/15/2022 15:57   ECHOCARDIOGRAM COMPLETE  Result Date: 12/15/2022    ECHOCARDIOGRAM REPORT   Patient Name:   [name] Date of Exam: 12/15/2022 Medical Rec #:  [id number]        Height:       64.0 in Accession #:    [id number]       Weight:       131.8 lb Date of Birth:  01-05-1976        BSA:          1.639 m Patient Age:    47 years         BP:           123/84 mmHg Patient Gender: F                HR:           85 bpm. Exam Location:  Inpatient Procedure: 2D Echo, Color Doppler and Cardiac Doppler Indications:    Shock  History:        Patient has prior history of Echocardiogram examinations, most                 recent 12/13/2022. Breast CA, family hx breast CA, Crohn's                 disease.  Sonographer:    [name] Referring Phys: [id number] PETER E BABCOCK  Sonographer Comments: Suboptimal apical window and echo performed with patient supine and on artificial respirator. Image acquisition challenging due to patient body habitus, Image acquisition challenging due to respiratory motion and Image acquisition challenging due to breast implants. IMPRESSIONS  1. Left ventricular ejection fraction, by estimation, is 60 to 65%. The left ventricle has normal function. The left ventricle has no regional wall motion abnormalities. Left ventricular diastolic parameters are indeterminate. There is incoordiante septal motion.  2. Right ventricular systolic function is low normal. The right ventricular size is normal. There is normal pulmonary artery systolic  pressure. The estimated right ventricular systolic pressure is 23.7 mmHg.  3. The mitral valve is normal in structure. No evidence of mitral valve regurgitation. No evidence of mitral stenosis.  4. The tricuspid valve is abnormal. Tricuspid valve regurgitation is mild to moderate.  5. The aortic valve is tricuspid. Aortic valve regurgitation is not visualized. No aortic stenosis is present.  6. The inferior vena cava is normal in size with <50% respiratory variability, suggesting right atrial pressure of 8 mmHg. Comparison(s): Changes from prior study  Brachial      Full       Yes       Yes                                   +----------+------------+---------+-----------+----------+--------------------+ Radial        Full                                                       +----------+------------+---------+-----------+----------+--------------------+ Ulnar         Full                                                       +----------+------------+---------+-----------+----------+--------------------+ Cephalic      Full                                                       +----------+------------+---------+-----------+----------+--------------------+ Basilic       Full       Yes       Yes                                   +----------+------------+---------+-----------+----------+--------------------+ Limited visualization of subclavian vein due to port placement. Cephalic vein not well visualized due to size of vessel.  Summary: No evidence of deep vein or superficial vein thrombosis involving the right and left upper extremities. Right: However, unable to visualize the subclavian vein.  *See table(s) above for measurements and observations.  Diagnosing physician: Coral Else MD Electronically signed by Coral Else MD on 12/30/2022 at 10:08:06 AM.    Final    DG CHEST PORT 1 VIEW  Result Date:  12/27/2022 CLINICAL DATA:  Fever EXAM: PORTABLE CHEST 1 VIEW COMPARISON:  12/22/2022 FINDINGS: Enteric tube extends below the diaphragm with distal tip terminating within the third portion of the duodenum. Left chest port and right subclavian central venous catheter remain appropriately positioned. Normal heart size. Left greater than right bibasilar opacities with bilateral pleural effusions, slightly improved. No pneumothorax. IMPRESSION: Left greater than right bibasilar opacities with bilateral pleural effusions, slightly improved. Electronically Signed   By: Duanne Guess D.O.   On: 12/27/2022 13:43   VAS [place] LOWER EXTREMITY VENOUS (DVT)  Result Date: 12/27/2022  Lower Venous DVT Study Patient Name:  [name]  Date of Exam:   12/27/2022 Medical Rec #: [id number]         Accession #:    [id number] Date of Birth: 02-Oct-1975         Patient Gender: F Patient Age:   42 years Exam Location:  [facility] Procedure:      VAS [place] LOWER EXTREMITY VENOUS (DVT) Referring Phys: MICHAEL MACZIS --------------------------------------------------------------------------------  Indications: Pulmonary embolism.  Risk Factors: Confirmed PE. Limitations: Poor ultrasound/tissue interface, bandages, open wound and patient positioning. Comparison Study: 12/14/2022, 12/20/2022 - Negative for DVT. Performing Technologist: Chanda Busing  small amount of pneumoperitoneum within the central  upper abdomen. This is nonspecific given recent surgical intervention. 3. Large volume ascites. 4. Bilateral pleural effusions with underlying compressive atelectasis, right greater than left. 5. Diffuse subcutaneous edema. These results were called by telephone at the time of interpretation on 12/25/2022 at 457 pm to provider Chevis Pretty, who verbally acknowledged these results. Electronically Signed   By: Sharlet Salina M.D.   On: 12/25/2022 17:03   DG Abd 1 View  Result Date: 12/23/2022 CLINICAL DATA:  NG tube placement. EXAM: ABDOMEN - 1 VIEW COMPARISON:  12/19/2022. FINDINGS: Enteric tube passes below the diaphragm, through the stomach into the right mid abdomen projecting the level of the duodenal bulb/pylorus. No bowel dilation. IMPRESSION: 1. Enteric feeding tube tip projects the level of the pylorus/duodenal bulb. Electronically Signed   By: Amie Portland M.D.   On: 12/23/2022 15:12   DG Swallowing Func-Speech Pathology  Result Date: 12/23/2022 Table formatting from the original result was not included. Modified Barium Swallow Study Patient Details Name: [name] MRN: [id number] Date of Birth: [birth year] Today's Date: 12/23/2022 HPI/PMH: HPI: Per CCM note "47 year old female with past medical history of recurrent breast cancer not currently on treatment, Crohn's disease s/p subtotal colectomy in 2021 recurrent obstructions, colonic stricture, IDA, recent hospitalization 8/10-8/15/2024 for obstruction seen by GI and general surgery. Underwent EGD and flexible sigmoidoscopy and discharged on steroids who presented to the emergency department on 12/12/2022 with abdominal pain, nausea, vomiting diarrhea. In the ED tachycardic, hypertensive. ED provider notes abdominal distention. She was given 2L IVF, morphine, IV PPI. Labs with WBC 10.8, Hgb 10.7, K 3.2. CT AP was ordered and showed markedly dilated distal small bowel consistent with chronic obstruction. No changes since prior study. Patient was  admitted to [facility]. They consulted GI for input. Unfortunately, around 0820 9/4 patient was found on the floor in the bathroom in ED pulseless. They estimate downtime around 5 minutes. ACLS was initiated. Concern for possible torsades de pointes and given IV mag, defibrillated. Coded for around 5 minutes. She was intubated. She had copious vomitus in the ETT. ROSC achieved and ICU was consulted. Notably, she had I-stat peri-code which showed hemoglobin of 5.5 down from 10 overnight."  Pt was extubated 12/18/2021. Clinical Impression: Clinical Impression: MBS was completed in the laterial projection using barium impregnated thin liquids via spoon, mildly thick liquid via spoon and cup and pureed material.  She presented with an oral and pharyngeal dysphagia.  Aspiration with a weak cough response was seen given thin liquids via spoon and mildly thick liquids via self fed cup sips.  Suggest patient remain NPO with comfort feeds of 1/2 tsp bites of pureed material and spoon sips of mildly thick liquids.  Oral care needs to be diligent and compeleted 2-3 t imes per day using a toothbrush and toothpaste.  ST will follow for swallowing therapy.  Please see below for more information regarding swallowing physiology.  ORAL PHASE  1.  Poor lingual control during directed bolus hold.  She was noted to lose material into the buccal area and/or under the tongue prior to the direction to swallow.  This lack of control led to premature loss of the bolus with thin and mildly thick liquids resonating in the pyriform sinuses and pureed material resonating in the vallecular prior to the swallow trigger.  2.  Repetitive/disorganized lingual motion was seen leading to a delay in A/P transport.    PHARYNGEAL PHASE  1.  Significantly reduced  infusion Inject 1,800 Units/hr into the vein continuous.   insulin aspart 100 UNIT/ML injection Commonly known as: novoLOG Inject 0-9 Units into the skin 3 (three) times daily with meals.   lip balm ointment Apply topically as needed.   loperamide HCl 1 MG/7.5ML suspension Commonly known as: IMODIUM Take 30 mLs (4 mg total) by mouth 4 (four) times daily.   metoprolol tartrate 50 MG tablet Commonly known as: LOPRESSOR Take 1 tablet (50 mg total) by mouth 2 (two) times daily.   metroNIDAZOLE 500 MG/100ML Commonly known as: FLAGYL Inject 100 mLs (500 mg total) into the vein 2 (two) times daily.   oxyCODONE 5 MG immediate release tablet Commonly known as: Oxy IR/ROXICODONE Take 1-2 tablets (5-10 mg total) by mouth every 4 (four) hours as needed for breakthrough pain.   Vitamin D (Ergocalciferol) 1.25 MG (50000 UNIT) Caps capsule Commonly known as: DRISDOL Take 1 capsule (50,000 Units total) by mouth every 7 (seven) days. Start taking on: January 18, 2023               Discharge Care Instructions  (From admission, onward)           Start     Ordered   01/12/23 0000  Discharge wound care:       Comments: Apply moist  fluffed gauze to abd wound Q day, then cover with ABD pad and tape.   01/12/23 1359            Discharge Exam: Filed Weights   01/09/23 0500 01/10/23 0700 01/11/23 0500  Weight: 54.9 kg 51 kg 51 kg   General exam: Appears calm and comfortable  Respiratory system: Clear to auscultation. Respiratory effort normal. Cardiovascular system: S1 & S2 heard, RRR.  Gastrointestinal system: Abdomen is SOFT, bandaged. Drain placement. Central nervous system: Alert and oriented. No focal neurological deficits. Extremities: Symmetric 5 x 5 power. Skin: No rashes,  Psychiatry: Mood & affect appropriate.    Condition at discharge: fair  The results of significant diagnostics from this hospitalization (including imaging, microbiology, ancillary and laboratory) are listed below for reference.   Imaging Studies: DG Swallowing Func-Speech Pathology  Result Date: 01/12/2023 Table formatting from the original result was not included. Modified Barium Swallow Study Patient Details Name: [name] MRN: [id number] Date of Birth: [birth year] Today's Date: 01/12/2023 HPI/PMH: HPI: Per CCM note "47 year old female with past medical history of recurrent breast cancer not currently on treatment, Crohn's disease s/p subtotal colectomy in 2021 recurrent obstructions, colonic stricture, IDA, recent hospitalization 8/10-8/15/2024 for obstruction seen by GI and general surgery. Underwent EGD and flexible sigmoidoscopy and discharged on steroids who presented to the emergency department on 12/12/2022 with abdominal pain, nausea, vomiting diarrhea. In the ED tachycardic, hypertensive. ED provider notes abdominal distention. She was given 2L IVF, morphine, IV PPI. Labs with WBC 10.8, Hgb 10.7, K 3.2. CT AP was ordered and showed markedly dilated distal small bowel consistent with chronic obstruction. 12/13/2022 patient was found on the floor in the bathroom in ED pulseless. They estimate downtime around 5 minutes. ACLS was  initiated. Concern for possible torsades de pointes and given IV mag, defibrillated. Coded for around 5 minutes. She was intubated. She had copious vomitus in the ETT. ROSC achieved and ICU was consulted. Notably, she had I-stat peri-code which showed hemoglobin of 5.5 down from 10 overnight."  Pt was extubated 12/18/2021.  Pt has been maintained on TPN and is now getting trickle feeds for nutrition. SLP following for dysphagia management.  FLEXIBLE SIGMOIDOSCOPY;  Surgeon: Benancio Deeds, MD;  Location: [facility];  Service: Gastroenterology;  Laterality: N/A;  FLEXIBLE SIGMOIDOSCOPY N/A 09/07/2021  Procedure: FLEXIBLE SIGMOIDOSCOPY;  Surgeon: Lynann Bologna, MD;  Location: [facility];  Service: Gastroenterology;  Laterality: N/A;  FLEXIBLE SIGMOIDOSCOPY N/A 11/21/2022  Procedure: FLEXIBLE SIGMOIDOSCOPY;  Surgeon: Meridee Score Netty Starring., MD;  Location: [facility];  Service: Gastroenterology;  Laterality: N/A;  LAPAROTOMY N/A 12/14/2022  Procedure: EXPLORATORY LAPAROTOMY WITH SMALL BOWEL RESECTION;  Surgeon: Moise Boring, MD;  Location: [facility];  Service: General;  Laterality: N/A;  LAPAROTOMY N/A 12/16/2022  Procedure: REOPENING LAPAROTOMY, ILEOSTOMY;  ABDOMINAL CLOSURE;  Surgeon: Moise Boring, MD;  Location: [facility];  Service: General;  Laterality: N/A;  NIPPLE SPARING MASTECTOMY Bilateral 11/09/2020  Procedure: BILATERAL NIPPLE SPARING MASTECTOMY;  Surgeon: Emelia Loron, MD;  Location: [facility];  Service: General;  Laterality: Bilateral;  PORT-A-CATH REMOVAL N/A 07/25/2021  Procedure: REMOVAL PORT-A-CATH;  Surgeon: Allena Napoleon, MD;  Location: [facility];  Service: Plastics;  Laterality: N/A;  PORTACATH PLACEMENT N/A 06/23/2020  Procedure: INSERTION PORT-A-CATH;  Surgeon: Emelia Loron, MD;  Location: [facility];  Service: General;  Laterality: N/A;  START TIME OF 11:00 AM FOR 60 MINUTES [place] [facility]  PORTACATH PLACEMENT Left 05/02/2022  Procedure: INSERTION PORT-A-CATH;  Surgeon: Emelia Loron, MD;  Location: [facility];  Service: General;  Laterality: Left;  RADIOACTIVE SEED GUIDED EXCISIONAL BREAST BIOPSY Right 05/02/2022  Procedure: RADIOACTIVE SEED GUIDED RIGHT BREAST MASS EXCISION;  Surgeon: Emelia Loron, MD;  Location: [facility];  Service: General;  Laterality: Right;  REMOVAL OF BILATERAL TISSUE EXPANDERS WITH PLACEMENT OF BILATERAL BREAST IMPLANTS Bilateral 07/25/2021  Procedure: REMOVAL OF BILATERAL TISSUE EXPANDERS WITH PLACEMENT OF BILATERAL BREAST IMPLANTS;  Surgeon: Allena Napoleon, MD;  Location:  SURGERY CENTER;  Service: Plastics;  Laterality: Bilateral;  1.5 Dimas Aguas, MA, CCC-SLP Acute Rehab SLP [phone] Fleet Contras 12/23/2022, 2:54 PM  DG Chest Port 1 View  Result Date: 12/22/2022 CLINICAL DATA:  Acute respiratory failure, chest pain, crackles at right lung base. EXAM: PORTABLE CHEST 1 VIEW COMPARISON:  12/19/2022. FINDINGS: The heart size and mediastinal contours are stable. A left chest port and right central venous catheter terminates over the superior vena cava. Hazy opacities are noted in the mid to lower lung fields bilaterally suggesting moderate layering pleural effusions. Airspace disease is noted at the lung bases. No pneumothorax. An enteric tube terminates in the stomach. IMPRESSION: Hazy opacities in the mid to lower lung fields bilaterally likely reflecting moderate layering pleural effusions with atelectasis or infiltrate. Electronically Signed   By: Thornell Sartorius M.D.   On: 12/22/2022 04:45   VAS [place] LOWER EXTREMITY VENOUS (DVT)  Result Date: 12/21/2022  Lower Venous DVT Study Patient Name:  [name]  Date of Exam:   12/20/2022 Medical Rec #: [id number]         Accession #:    [id number] Date of Birth: [birth year]         Patient Gender: F Patient Age:   74 years Exam Location:  [facility] Procedure:      VAS [place] LOWER EXTREMITY VENOUS (DVT) Referring Phys: Mertha Baars  --------------------------------------------------------------------------------  Indications: Pulmonary embolism.  Risk Factors: Cancer. Limitations: Poor ultrasound/tissue interface, bandages and patient positioning, patient immobility. Comparison Study: 12/14/2022 - Negative for DVT. Performing Technologist: Chanda Busing RVT  Examination Guidelines: A complete evaluation includes B-mode imaging, spectral Doppler, color Doppler, and  However, portions of this examination were limited- see technologist comments above.  - No cystic structure found in the popliteal fossa.  *See table(s) above for measurements and observations. Electronically signed by Gerarda Fraction on 12/21/2022 at 2:58:08 PM.    Final    ECHOCARDIOGRAM LIMITED  Result Date: 12/20/2022    ECHOCARDIOGRAM LIMITED REPORT   Patient Name:   [name] Date of Exam: 12/20/2022 Medical Rec #:  [id number]        Height:       64.0 in Accession #:    [id number]       Weight:       140.4 lb Date of Birth:  1976/04/10        BSA:          1.683 m Patient Age:    47 years         BP:           170/111 mmHg Patient Gender: F                HR:           120 bpm. Exam Location:  Inpatient Procedure: Cardiac Doppler, Limited Echo and Limited Color Doppler Indications:    Pulmonary Embolus I26.09  History:        Patient has prior history of Echocardiogram examinations, most                 recent 12/15/2022. Breast Cancer, Arrythmias:Cardiac Arrest; Risk                 Factors:Former Smoker.  Sonographer:    Aron Baba Referring Phys: [id number] Cristopher Peru  Sonographer Comments: No apical window and suboptimal subcostal window. Image acquisition challenging due to breast implants. IMPRESSIONS  1. Left ventricular ejection fraction, by estimation, is 60 to 65%. The left ventricle has normal function.  2. Right ventricular systolic function is normal. The right ventricular size is normal. Tricuspid regurgitation signal is inadequate for assessing PA pressure.  3. Aortic valve regurgitation is not visualized.  4.  The inferior vena cava is dilated in size with >50% respiratory variability, suggesting right atrial pressure of 8 mmHg. Conclusion(s)/Recommendation(s): No RV strain, no pulmonary hypertension. FINDINGS  Left Ventricle: Left ventricular ejection fraction, by estimation, is 60 to 65%. The left ventricle has normal function. There is no left ventricular hypertrophy. Right Ventricle: The right ventricular size is normal. Right ventricular systolic function is normal. Tricuspid regurgitation signal is inadequate for assessing PA pressure. Tricuspid Valve: Tricuspid valve regurgitation is trivial. Aortic Valve: Aortic valve regurgitation is not visualized. Pulmonic Valve: Pulmonic valve regurgitation is not visualized. Venous: The inferior vena cava is dilated in size with greater than 50% respiratory variability, suggesting right atrial pressure of 8 mmHg. Additional Comments: Spectral Doppler performed. Color Doppler performed.  LEFT VENTRICLE PLAX 2D LVIDd:         3.50 cm LVIDs:         2.10 cm LV PW:         0.90 cm LV IVS:        0.60 cm  LEFT ATRIUM         Index LA diam:    2.20 cm 1.31 cm/m Carolan Clines Electronically signed by Carolan Clines Signature Date/Time: 12/20/2022/12:08:08 PM    Final    CT Angio Chest Pulmonary Embolism (PE) W or WO Contrast  Result Date: 12/19/2022 CLINICAL DATA:  History of breast cancer and Crohn's disease. Chest pain.  infusion Inject 1,800 Units/hr into the vein continuous.   insulin aspart 100 UNIT/ML injection Commonly known as: novoLOG Inject 0-9 Units into the skin 3 (three) times daily with meals.   lip balm ointment Apply topically as needed.   loperamide HCl 1 MG/7.5ML suspension Commonly known as: IMODIUM Take 30 mLs (4 mg total) by mouth 4 (four) times daily.   metoprolol tartrate 50 MG tablet Commonly known as: LOPRESSOR Take 1 tablet (50 mg total) by mouth 2 (two) times daily.   metroNIDAZOLE 500 MG/100ML Commonly known as: FLAGYL Inject 100 mLs (500 mg total) into the vein 2 (two) times daily.   oxyCODONE 5 MG immediate release tablet Commonly known as: Oxy IR/ROXICODONE Take 1-2 tablets (5-10 mg total) by mouth every 4 (four) hours as needed for breakthrough pain.   Vitamin D (Ergocalciferol) 1.25 MG (50000 UNIT) Caps capsule Commonly known as: DRISDOL Take 1 capsule (50,000 Units total) by mouth every 7 (seven) days. Start taking on: January 18, 2023               Discharge Care Instructions  (From admission, onward)           Start     Ordered   01/12/23 0000  Discharge wound care:       Comments: Apply moist  fluffed gauze to abd wound Q day, then cover with ABD pad and tape.   01/12/23 1359            Discharge Exam: Filed Weights   01/09/23 0500 01/10/23 0700 01/11/23 0500  Weight: 54.9 kg 51 kg 51 kg   General exam: Appears calm and comfortable  Respiratory system: Clear to auscultation. Respiratory effort normal. Cardiovascular system: S1 & S2 heard, RRR.  Gastrointestinal system: Abdomen is SOFT, bandaged. Drain placement. Central nervous system: Alert and oriented. No focal neurological deficits. Extremities: Symmetric 5 x 5 power. Skin: No rashes,  Psychiatry: Mood & affect appropriate.    Condition at discharge: fair  The results of significant diagnostics from this hospitalization (including imaging, microbiology, ancillary and laboratory) are listed below for reference.   Imaging Studies: DG Swallowing Func-Speech Pathology  Result Date: 01/12/2023 Table formatting from the original result was not included. Modified Barium Swallow Study Patient Details Name: [name] MRN: [id number] Date of Birth: [birth year] Today's Date: 01/12/2023 HPI/PMH: HPI: Per CCM note "47 year old female with past medical history of recurrent breast cancer not currently on treatment, Crohn's disease s/p subtotal colectomy in 2021 recurrent obstructions, colonic stricture, IDA, recent hospitalization 8/10-8/15/2024 for obstruction seen by GI and general surgery. Underwent EGD and flexible sigmoidoscopy and discharged on steroids who presented to the emergency department on 12/12/2022 with abdominal pain, nausea, vomiting diarrhea. In the ED tachycardic, hypertensive. ED provider notes abdominal distention. She was given 2L IVF, morphine, IV PPI. Labs with WBC 10.8, Hgb 10.7, K 3.2. CT AP was ordered and showed markedly dilated distal small bowel consistent with chronic obstruction. 12/13/2022 patient was found on the floor in the bathroom in ED pulseless. They estimate downtime around 5 minutes. ACLS was  initiated. Concern for possible torsades de pointes and given IV mag, defibrillated. Coded for around 5 minutes. She was intubated. She had copious vomitus in the ETT. ROSC achieved and ICU was consulted. Notably, she had I-stat peri-code which showed hemoglobin of 5.5 down from 10 overnight."  Pt was extubated 12/18/2021.  Pt has been maintained on TPN and is now getting trickle feeds for nutrition. SLP following for dysphagia management.  Accession #:    [id number] Date of Birth: 1976/01/06         Patient Gender: F Patient Age:   83 years Exam Location:  [facility] Procedure:      VAS [place] LOWER EXTREMITY VENOUS (DVT) Referring Phys: Levon Hedger --------------------------------------------------------------------------------  Indications: Edema.  Risk Factors: None identified. Limitations: Bandages and patient positioning. Comparison Study: No prior studies.  Performing Technologist: Chanda Busing RVT  Examination Guidelines: A complete evaluation includes B-mode imaging, spectral Doppler, color Doppler, and power Doppler as needed of all accessible portions of each vessel. Bilateral testing is considered an integral part of a complete examination. Limited examinations for reoccurring indications may be performed as noted. The reflux portion of the exam is performed with the patient in reverse Trendelenburg.  +---------+---------------+---------+-----------+----------+-------------------+ RIGHT    CompressibilityPhasicitySpontaneityPropertiesThrombus Aging      +---------+---------------+---------+-----------+----------+-------------------+ CFV                                                   Not well visualized +---------+---------------+---------+-----------+----------+-------------------+ SFJ                                                   Not well visualized +---------+---------------+---------+-----------+----------+-------------------+ FV Prox  Full           Yes      Yes                                      +---------+---------------+---------+-----------+----------+-------------------+ FV Mid   Full                                                             +---------+---------------+---------+-----------+----------+-------------------+ FV DistalFull                                                             +---------+---------------+---------+-----------+----------+-------------------+ PFV                                                   Not well visualized +---------+---------------+---------+-----------+----------+-------------------+ POP      Full           Yes      Yes                                      +---------+---------------+---------+-----------+----------+-------------------+ PTV      Full                                                              +---------+---------------+---------+-----------+----------+-------------------+  However, portions of this examination were limited- see technologist comments above.  - No cystic structure found in the popliteal fossa.  *See table(s) above for measurements and observations. Electronically signed by Gerarda Fraction on 12/21/2022 at 2:58:08 PM.    Final    ECHOCARDIOGRAM LIMITED  Result Date: 12/20/2022    ECHOCARDIOGRAM LIMITED REPORT   Patient Name:   [name] Date of Exam: 12/20/2022 Medical Rec #:  [id number]        Height:       64.0 in Accession #:    [id number]       Weight:       140.4 lb Date of Birth:  1976/04/10        BSA:          1.683 m Patient Age:    47 years         BP:           170/111 mmHg Patient Gender: F                HR:           120 bpm. Exam Location:  Inpatient Procedure: Cardiac Doppler, Limited Echo and Limited Color Doppler Indications:    Pulmonary Embolus I26.09  History:        Patient has prior history of Echocardiogram examinations, most                 recent 12/15/2022. Breast Cancer, Arrythmias:Cardiac Arrest; Risk                 Factors:Former Smoker.  Sonographer:    Aron Baba Referring Phys: [id number] Cristopher Peru  Sonographer Comments: No apical window and suboptimal subcostal window. Image acquisition challenging due to breast implants. IMPRESSIONS  1. Left ventricular ejection fraction, by estimation, is 60 to 65%. The left ventricle has normal function.  2. Right ventricular systolic function is normal. The right ventricular size is normal. Tricuspid regurgitation signal is inadequate for assessing PA pressure.  3. Aortic valve regurgitation is not visualized.  4.  The inferior vena cava is dilated in size with >50% respiratory variability, suggesting right atrial pressure of 8 mmHg. Conclusion(s)/Recommendation(s): No RV strain, no pulmonary hypertension. FINDINGS  Left Ventricle: Left ventricular ejection fraction, by estimation, is 60 to 65%. The left ventricle has normal function. There is no left ventricular hypertrophy. Right Ventricle: The right ventricular size is normal. Right ventricular systolic function is normal. Tricuspid regurgitation signal is inadequate for assessing PA pressure. Tricuspid Valve: Tricuspid valve regurgitation is trivial. Aortic Valve: Aortic valve regurgitation is not visualized. Pulmonic Valve: Pulmonic valve regurgitation is not visualized. Venous: The inferior vena cava is dilated in size with greater than 50% respiratory variability, suggesting right atrial pressure of 8 mmHg. Additional Comments: Spectral Doppler performed. Color Doppler performed.  LEFT VENTRICLE PLAX 2D LVIDd:         3.50 cm LVIDs:         2.10 cm LV PW:         0.90 cm LV IVS:        0.60 cm  LEFT ATRIUM         Index LA diam:    2.20 cm 1.31 cm/m Carolan Clines Electronically signed by Carolan Clines Signature Date/Time: 12/20/2022/12:08:08 PM    Final    CT Angio Chest Pulmonary Embolism (PE) W or WO Contrast  Result Date: 12/19/2022 CLINICAL DATA:  History of breast cancer and Crohn's disease. Chest pain.  infusion Inject 1,800 Units/hr into the vein continuous.   insulin aspart 100 UNIT/ML injection Commonly known as: novoLOG Inject 0-9 Units into the skin 3 (three) times daily with meals.   lip balm ointment Apply topically as needed.   loperamide HCl 1 MG/7.5ML suspension Commonly known as: IMODIUM Take 30 mLs (4 mg total) by mouth 4 (four) times daily.   metoprolol tartrate 50 MG tablet Commonly known as: LOPRESSOR Take 1 tablet (50 mg total) by mouth 2 (two) times daily.   metroNIDAZOLE 500 MG/100ML Commonly known as: FLAGYL Inject 100 mLs (500 mg total) into the vein 2 (two) times daily.   oxyCODONE 5 MG immediate release tablet Commonly known as: Oxy IR/ROXICODONE Take 1-2 tablets (5-10 mg total) by mouth every 4 (four) hours as needed for breakthrough pain.   Vitamin D (Ergocalciferol) 1.25 MG (50000 UNIT) Caps capsule Commonly known as: DRISDOL Take 1 capsule (50,000 Units total) by mouth every 7 (seven) days. Start taking on: January 18, 2023               Discharge Care Instructions  (From admission, onward)           Start     Ordered   01/12/23 0000  Discharge wound care:       Comments: Apply moist  fluffed gauze to abd wound Q day, then cover with ABD pad and tape.   01/12/23 1359            Discharge Exam: Filed Weights   01/09/23 0500 01/10/23 0700 01/11/23 0500  Weight: 54.9 kg 51 kg 51 kg   General exam: Appears calm and comfortable  Respiratory system: Clear to auscultation. Respiratory effort normal. Cardiovascular system: S1 & S2 heard, RRR.  Gastrointestinal system: Abdomen is SOFT, bandaged. Drain placement. Central nervous system: Alert and oriented. No focal neurological deficits. Extremities: Symmetric 5 x 5 power. Skin: No rashes,  Psychiatry: Mood & affect appropriate.    Condition at discharge: fair  The results of significant diagnostics from this hospitalization (including imaging, microbiology, ancillary and laboratory) are listed below for reference.   Imaging Studies: DG Swallowing Func-Speech Pathology  Result Date: 01/12/2023 Table formatting from the original result was not included. Modified Barium Swallow Study Patient Details Name: [name] MRN: [id number] Date of Birth: [birth year] Today's Date: 01/12/2023 HPI/PMH: HPI: Per CCM note "47 year old female with past medical history of recurrent breast cancer not currently on treatment, Crohn's disease s/p subtotal colectomy in 2021 recurrent obstructions, colonic stricture, IDA, recent hospitalization 8/10-8/15/2024 for obstruction seen by GI and general surgery. Underwent EGD and flexible sigmoidoscopy and discharged on steroids who presented to the emergency department on 12/12/2022 with abdominal pain, nausea, vomiting diarrhea. In the ED tachycardic, hypertensive. ED provider notes abdominal distention. She was given 2L IVF, morphine, IV PPI. Labs with WBC 10.8, Hgb 10.7, K 3.2. CT AP was ordered and showed markedly dilated distal small bowel consistent with chronic obstruction. 12/13/2022 patient was found on the floor in the bathroom in ED pulseless. They estimate downtime around 5 minutes. ACLS was  initiated. Concern for possible torsades de pointes and given IV mag, defibrillated. Coded for around 5 minutes. She was intubated. She had copious vomitus in the ETT. ROSC achieved and ICU was consulted. Notably, she had I-stat peri-code which showed hemoglobin of 5.5 down from 10 overnight."  Pt was extubated 12/18/2021.  Pt has been maintained on TPN and is now getting trickle feeds for nutrition. SLP following for dysphagia management.  However, portions of this examination were limited- see technologist comments above.  - No cystic structure found in the popliteal fossa.  *See table(s) above for measurements and observations. Electronically signed by Gerarda Fraction on 12/21/2022 at 2:58:08 PM.    Final    ECHOCARDIOGRAM LIMITED  Result Date: 12/20/2022    ECHOCARDIOGRAM LIMITED REPORT   Patient Name:   [name] Date of Exam: 12/20/2022 Medical Rec #:  [id number]        Height:       64.0 in Accession #:    [id number]       Weight:       140.4 lb Date of Birth:  1976/04/10        BSA:          1.683 m Patient Age:    47 years         BP:           170/111 mmHg Patient Gender: F                HR:           120 bpm. Exam Location:  Inpatient Procedure: Cardiac Doppler, Limited Echo and Limited Color Doppler Indications:    Pulmonary Embolus I26.09  History:        Patient has prior history of Echocardiogram examinations, most                 recent 12/15/2022. Breast Cancer, Arrythmias:Cardiac Arrest; Risk                 Factors:Former Smoker.  Sonographer:    Aron Baba Referring Phys: [id number] Cristopher Peru  Sonographer Comments: No apical window and suboptimal subcostal window. Image acquisition challenging due to breast implants. IMPRESSIONS  1. Left ventricular ejection fraction, by estimation, is 60 to 65%. The left ventricle has normal function.  2. Right ventricular systolic function is normal. The right ventricular size is normal. Tricuspid regurgitation signal is inadequate for assessing PA pressure.  3. Aortic valve regurgitation is not visualized.  4.  The inferior vena cava is dilated in size with >50% respiratory variability, suggesting right atrial pressure of 8 mmHg. Conclusion(s)/Recommendation(s): No RV strain, no pulmonary hypertension. FINDINGS  Left Ventricle: Left ventricular ejection fraction, by estimation, is 60 to 65%. The left ventricle has normal function. There is no left ventricular hypertrophy. Right Ventricle: The right ventricular size is normal. Right ventricular systolic function is normal. Tricuspid regurgitation signal is inadequate for assessing PA pressure. Tricuspid Valve: Tricuspid valve regurgitation is trivial. Aortic Valve: Aortic valve regurgitation is not visualized. Pulmonic Valve: Pulmonic valve regurgitation is not visualized. Venous: The inferior vena cava is dilated in size with greater than 50% respiratory variability, suggesting right atrial pressure of 8 mmHg. Additional Comments: Spectral Doppler performed. Color Doppler performed.  LEFT VENTRICLE PLAX 2D LVIDd:         3.50 cm LVIDs:         2.10 cm LV PW:         0.90 cm LV IVS:        0.60 cm  LEFT ATRIUM         Index LA diam:    2.20 cm 1.31 cm/m Carolan Clines Electronically signed by Carolan Clines Signature Date/Time: 12/20/2022/12:08:08 PM    Final    CT Angio Chest Pulmonary Embolism (PE) W or WO Contrast  Result Date: 12/19/2022 CLINICAL DATA:  History of breast cancer and Crohn's disease. Chest pain.  Accession #:    [id number] Date of Birth: 1976/01/06         Patient Gender: F Patient Age:   83 years Exam Location:  [facility] Procedure:      VAS [place] LOWER EXTREMITY VENOUS (DVT) Referring Phys: Levon Hedger --------------------------------------------------------------------------------  Indications: Edema.  Risk Factors: None identified. Limitations: Bandages and patient positioning. Comparison Study: No prior studies.  Performing Technologist: Chanda Busing RVT  Examination Guidelines: A complete evaluation includes B-mode imaging, spectral Doppler, color Doppler, and power Doppler as needed of all accessible portions of each vessel. Bilateral testing is considered an integral part of a complete examination. Limited examinations for reoccurring indications may be performed as noted. The reflux portion of the exam is performed with the patient in reverse Trendelenburg.  +---------+---------------+---------+-----------+----------+-------------------+ RIGHT    CompressibilityPhasicitySpontaneityPropertiesThrombus Aging      +---------+---------------+---------+-----------+----------+-------------------+ CFV                                                   Not well visualized +---------+---------------+---------+-----------+----------+-------------------+ SFJ                                                   Not well visualized +---------+---------------+---------+-----------+----------+-------------------+ FV Prox  Full           Yes      Yes                                      +---------+---------------+---------+-----------+----------+-------------------+ FV Mid   Full                                                             +---------+---------------+---------+-----------+----------+-------------------+ FV DistalFull                                                             +---------+---------------+---------+-----------+----------+-------------------+ PFV                                                   Not well visualized +---------+---------------+---------+-----------+----------+-------------------+ POP      Full           Yes      Yes                                      +---------+---------------+---------+-----------+----------+-------------------+ PTV      Full                                                              +---------+---------------+---------+-----------+----------+-------------------+  Accession #:    [id number] Date of Birth: 1976/01/06         Patient Gender: F Patient Age:   83 years Exam Location:  [facility] Procedure:      VAS [place] LOWER EXTREMITY VENOUS (DVT) Referring Phys: Levon Hedger --------------------------------------------------------------------------------  Indications: Edema.  Risk Factors: None identified. Limitations: Bandages and patient positioning. Comparison Study: No prior studies.  Performing Technologist: Chanda Busing RVT  Examination Guidelines: A complete evaluation includes B-mode imaging, spectral Doppler, color Doppler, and power Doppler as needed of all accessible portions of each vessel. Bilateral testing is considered an integral part of a complete examination. Limited examinations for reoccurring indications may be performed as noted. The reflux portion of the exam is performed with the patient in reverse Trendelenburg.  +---------+---------------+---------+-----------+----------+-------------------+ RIGHT    CompressibilityPhasicitySpontaneityPropertiesThrombus Aging      +---------+---------------+---------+-----------+----------+-------------------+ CFV                                                   Not well visualized +---------+---------------+---------+-----------+----------+-------------------+ SFJ                                                   Not well visualized +---------+---------------+---------+-----------+----------+-------------------+ FV Prox  Full           Yes      Yes                                      +---------+---------------+---------+-----------+----------+-------------------+ FV Mid   Full                                                             +---------+---------------+---------+-----------+----------+-------------------+ FV DistalFull                                                             +---------+---------------+---------+-----------+----------+-------------------+ PFV                                                   Not well visualized +---------+---------------+---------+-----------+----------+-------------------+ POP      Full           Yes      Yes                                      +---------+---------------+---------+-----------+----------+-------------------+ PTV      Full                                                              +---------+---------------+---------+-----------+----------+-------------------+  FLEXIBLE SIGMOIDOSCOPY;  Surgeon: Benancio Deeds, MD;  Location: [facility];  Service: Gastroenterology;  Laterality: N/A;  FLEXIBLE SIGMOIDOSCOPY N/A 09/07/2021  Procedure: FLEXIBLE SIGMOIDOSCOPY;  Surgeon: Lynann Bologna, MD;  Location: [facility];  Service: Gastroenterology;  Laterality: N/A;  FLEXIBLE SIGMOIDOSCOPY N/A 11/21/2022  Procedure: FLEXIBLE SIGMOIDOSCOPY;  Surgeon: Meridee Score Netty Starring., MD;  Location: [facility];  Service: Gastroenterology;  Laterality: N/A;  LAPAROTOMY N/A 12/14/2022  Procedure: EXPLORATORY LAPAROTOMY WITH SMALL BOWEL RESECTION;  Surgeon: Moise Boring, MD;  Location: [facility];  Service: General;  Laterality: N/A;  LAPAROTOMY N/A 12/16/2022  Procedure: REOPENING LAPAROTOMY, ILEOSTOMY;  ABDOMINAL CLOSURE;  Surgeon: Moise Boring, MD;  Location: [facility];  Service: General;  Laterality: N/A;  NIPPLE SPARING MASTECTOMY Bilateral 11/09/2020  Procedure: BILATERAL NIPPLE SPARING MASTECTOMY;  Surgeon: Emelia Loron, MD;  Location: [facility];  Service: General;  Laterality: Bilateral;  PORT-A-CATH REMOVAL N/A 07/25/2021  Procedure: REMOVAL PORT-A-CATH;  Surgeon: Allena Napoleon, MD;  Location: [facility];  Service: Plastics;  Laterality: N/A;  PORTACATH PLACEMENT N/A 06/23/2020  Procedure: INSERTION PORT-A-CATH;  Surgeon: Emelia Loron, MD;  Location: [facility];  Service: General;  Laterality: N/A;  START TIME OF 11:00 AM FOR 60 MINUTES [place] [facility]  PORTACATH PLACEMENT Left 05/02/2022  Procedure: INSERTION PORT-A-CATH;  Surgeon: Emelia Loron, MD;  Location: [facility];  Service: General;  Laterality: Left;  RADIOACTIVE SEED GUIDED EXCISIONAL BREAST BIOPSY Right 05/02/2022  Procedure: RADIOACTIVE SEED GUIDED RIGHT BREAST MASS EXCISION;  Surgeon: Emelia Loron, MD;  Location: [facility];  Service: General;  Laterality: Right;  REMOVAL OF BILATERAL TISSUE EXPANDERS WITH PLACEMENT OF BILATERAL BREAST IMPLANTS Bilateral 07/25/2021  Procedure: REMOVAL OF BILATERAL TISSUE EXPANDERS WITH PLACEMENT OF BILATERAL BREAST IMPLANTS;  Surgeon: Allena Napoleon, MD;  Location:  SURGERY CENTER;  Service: Plastics;  Laterality: Bilateral;  1.5 Dimas Aguas, MA, CCC-SLP Acute Rehab SLP [phone] Fleet Contras 12/23/2022, 2:54 PM  DG Chest Port 1 View  Result Date: 12/22/2022 CLINICAL DATA:  Acute respiratory failure, chest pain, crackles at right lung base. EXAM: PORTABLE CHEST 1 VIEW COMPARISON:  12/19/2022. FINDINGS: The heart size and mediastinal contours are stable. A left chest port and right central venous catheter terminates over the superior vena cava. Hazy opacities are noted in the mid to lower lung fields bilaterally suggesting moderate layering pleural effusions. Airspace disease is noted at the lung bases. No pneumothorax. An enteric tube terminates in the stomach. IMPRESSION: Hazy opacities in the mid to lower lung fields bilaterally likely reflecting moderate layering pleural effusions with atelectasis or infiltrate. Electronically Signed   By: Thornell Sartorius M.D.   On: 12/22/2022 04:45   VAS [place] LOWER EXTREMITY VENOUS (DVT)  Result Date: 12/21/2022  Lower Venous DVT Study Patient Name:  [name]  Date of Exam:   12/20/2022 Medical Rec #: [id number]         Accession #:    [id number] Date of Birth: [birth year]         Patient Gender: F Patient Age:   74 years Exam Location:  [facility] Procedure:      VAS [place] LOWER EXTREMITY VENOUS (DVT) Referring Phys: Mertha Baars  --------------------------------------------------------------------------------  Indications: Pulmonary embolism.  Risk Factors: Cancer. Limitations: Poor ultrasound/tissue interface, bandages and patient positioning, patient immobility. Comparison Study: 12/14/2022 - Negative for DVT. Performing Technologist: Chanda Busing RVT  Examination Guidelines: A complete evaluation includes B-mode imaging, spectral Doppler, color Doppler, and  visible pleural effusions or pneumothorax. No acute osseous abnormality. Left breast implant. Improved aeration lung IMPRESSION: Bases with minimal streaky left basilar opacities. Electronically Signed   By: Feliberto Harts M.D.   On:  01/03/2023 14:31   DG Swallowing Func-Speech Pathology  Result Date: 01/02/2023 Table formatting from the original result was not included. Modified Barium Swallow Study Patient Details Name: [name] MRN: [id number] Date of Birth: 12-15-1975 Today's Date: 12/29/2022 HPI/PMH: HPI: Per CCM note "47 year old female with past medical history of recurrent breast cancer not currently on treatment, Crohn's disease s/p subtotal colectomy in 2021 recurrent obstructions, colonic stricture, IDA, recent hospitalization 8/10-8/15/2024 for obstruction seen by GI and general surgery. Underwent EGD and flexible sigmoidoscopy and discharged on steroids who presented to the emergency department on 12/12/2022 with abdominal pain, nausea, vomiting diarrhea. In the ED tachycardic, hypertensive. ED provider notes abdominal distention. She was given 2L IVF, morphine, IV PPI. Labs with WBC 10.8, Hgb 10.7, K 3.2. CT AP was ordered and showed markedly dilated distal small bowel consistent with chronic obstruction. No changes since prior study. Patient was admitted to [facility]. They consulted GI for input. Unfortunately, around 0820 9/4 patient was found on the floor in the bathroom in ED pulseless. They estimate downtime around 5 minutes. ACLS was initiated. Concern for possible torsades de pointes and given IV mag, defibrillated. Coded for around 5 minutes. She was intubated. She had copious vomitus in the ETT. ROSC achieved and ICU was consulted. Notably, she had I-stat peri-code which showed hemoglobin of 5.5 down from 10 overnight."  Pt was extubated 12/18/2021.  Pt has been maintained on TPN and is now getting trickle feeds for nutrition. SLP following for dysphagia management. Clinical Impression: Clinical Impression: Today patient presents with improvement in swallow function compared to prior approx six days ago.  Mild pharyngeal dysphagia remains with inconsistent penetration of nectar/thin and mild aspiration of thin mixed with  secretions without sensation. However shallow aspiration and penetration was largely cleared with cued throat clear and re=swallow.  Deeper aspirates were not cleared with cued cough as cough remains weak and ineffective.  Pt's dysphagia continues to be characterized by impaired timing and adequacy of laryngeal closure/elevation.  Posterior posture at 45* helpful to retain boluses in posterior pharynx and subsequently decreasing airway risk. Did not remove feeding tube as did not appear to negatively impact swallow function significantly.  Recommend intiiate po diet of dys3/nectar thick liquids via tsp with strict precautions.  When pt is clearing her throat with po intake - she is likely penetrating at least to vocal fold level based on this test finding. Pt will require strict monitoring of po tolerance given silent nature of dysphagia and weak cough.  SLP will continue to follow for swallow exercises to mitigate dysphagia. Factors that may increase risk of adverse event in presence of aspiration [facility] [name] 2021): Factors that may increase risk of adverse event in presence of aspiration Rubye [facility] & [name] 2021): Poor general health and/or compromised immunity; Respiratory or GI disease; Limited mobility; Weak cough; Frequent aspiration of large volumes Recommendations/Plan: Swallowing Evaluation Recommendations Swallowing Evaluation Recommendations Recommendations: PO diet PO Diet Recommendation: Dysphagia 1 (Pureed); Dysphagia 3 (Mechanical soft); Mildly thick liquids (Level 2, nectar thick) Liquid Administration via: Spoon Medication Administration: Via alternative means (or with PUDDING) Supervision: Full assist for feeding Swallowing strategies  : Minimize environmental distractions; Slow rate; Small bites/sips Postural changes: Partially reclined for meals (45*) Oral care recommendations: Oral care QID (4x/day) Recommended consults: Consider  Accession #:    [id number] Date of Birth: 1976/01/06         Patient Gender: F Patient Age:   83 years Exam Location:  [facility] Procedure:      VAS [place] LOWER EXTREMITY VENOUS (DVT) Referring Phys: Levon Hedger --------------------------------------------------------------------------------  Indications: Edema.  Risk Factors: None identified. Limitations: Bandages and patient positioning. Comparison Study: No prior studies.  Performing Technologist: Chanda Busing RVT  Examination Guidelines: A complete evaluation includes B-mode imaging, spectral Doppler, color Doppler, and power Doppler as needed of all accessible portions of each vessel. Bilateral testing is considered an integral part of a complete examination. Limited examinations for reoccurring indications may be performed as noted. The reflux portion of the exam is performed with the patient in reverse Trendelenburg.  +---------+---------------+---------+-----------+----------+-------------------+ RIGHT    CompressibilityPhasicitySpontaneityPropertiesThrombus Aging      +---------+---------------+---------+-----------+----------+-------------------+ CFV                                                   Not well visualized +---------+---------------+---------+-----------+----------+-------------------+ SFJ                                                   Not well visualized +---------+---------------+---------+-----------+----------+-------------------+ FV Prox  Full           Yes      Yes                                      +---------+---------------+---------+-----------+----------+-------------------+ FV Mid   Full                                                             +---------+---------------+---------+-----------+----------+-------------------+ FV DistalFull                                                             +---------+---------------+---------+-----------+----------+-------------------+ PFV                                                   Not well visualized +---------+---------------+---------+-----------+----------+-------------------+ POP      Full           Yes      Yes                                      +---------+---------------+---------+-----------+----------+-------------------+ PTV      Full                                                              +---------+---------------+---------+-----------+----------+-------------------+  visible pleural effusions or pneumothorax. No acute osseous abnormality. Left breast implant. Improved aeration lung IMPRESSION: Bases with minimal streaky left basilar opacities. Electronically Signed   By: Feliberto Harts M.D.   On:  01/03/2023 14:31   DG Swallowing Func-Speech Pathology  Result Date: 01/02/2023 Table formatting from the original result was not included. Modified Barium Swallow Study Patient Details Name: [name] MRN: [id number] Date of Birth: 12-15-1975 Today's Date: 12/29/2022 HPI/PMH: HPI: Per CCM note "47 year old female with past medical history of recurrent breast cancer not currently on treatment, Crohn's disease s/p subtotal colectomy in 2021 recurrent obstructions, colonic stricture, IDA, recent hospitalization 8/10-8/15/2024 for obstruction seen by GI and general surgery. Underwent EGD and flexible sigmoidoscopy and discharged on steroids who presented to the emergency department on 12/12/2022 with abdominal pain, nausea, vomiting diarrhea. In the ED tachycardic, hypertensive. ED provider notes abdominal distention. She was given 2L IVF, morphine, IV PPI. Labs with WBC 10.8, Hgb 10.7, K 3.2. CT AP was ordered and showed markedly dilated distal small bowel consistent with chronic obstruction. No changes since prior study. Patient was admitted to [facility]. They consulted GI for input. Unfortunately, around 0820 9/4 patient was found on the floor in the bathroom in ED pulseless. They estimate downtime around 5 minutes. ACLS was initiated. Concern for possible torsades de pointes and given IV mag, defibrillated. Coded for around 5 minutes. She was intubated. She had copious vomitus in the ETT. ROSC achieved and ICU was consulted. Notably, she had I-stat peri-code which showed hemoglobin of 5.5 down from 10 overnight."  Pt was extubated 12/18/2021.  Pt has been maintained on TPN and is now getting trickle feeds for nutrition. SLP following for dysphagia management. Clinical Impression: Clinical Impression: Today patient presents with improvement in swallow function compared to prior approx six days ago.  Mild pharyngeal dysphagia remains with inconsistent penetration of nectar/thin and mild aspiration of thin mixed with  secretions without sensation. However shallow aspiration and penetration was largely cleared with cued throat clear and re=swallow.  Deeper aspirates were not cleared with cued cough as cough remains weak and ineffective.  Pt's dysphagia continues to be characterized by impaired timing and adequacy of laryngeal closure/elevation.  Posterior posture at 45* helpful to retain boluses in posterior pharynx and subsequently decreasing airway risk. Did not remove feeding tube as did not appear to negatively impact swallow function significantly.  Recommend intiiate po diet of dys3/nectar thick liquids via tsp with strict precautions.  When pt is clearing her throat with po intake - she is likely penetrating at least to vocal fold level based on this test finding. Pt will require strict monitoring of po tolerance given silent nature of dysphagia and weak cough.  SLP will continue to follow for swallow exercises to mitigate dysphagia. Factors that may increase risk of adverse event in presence of aspiration Rubye Oaks & [name] 2021): Factors that may increase risk of adverse event in presence of aspiration Rubye Oaks & [name] 2021): Poor general health and/or compromised immunity; Respiratory or GI disease; Limited mobility; Weak cough; Frequent aspiration of large volumes Recommendations/Plan: Swallowing Evaluation Recommendations Swallowing Evaluation Recommendations Recommendations: PO diet PO Diet Recommendation: Dysphagia 1 (Pureed); Dysphagia 3 (Mechanical soft); Mildly thick liquids (Level 2, nectar thick) Liquid Administration via: Spoon Medication Administration: Via alternative means (or with PUDDING) Supervision: Full assist for feeding Swallowing strategies  : Minimize environmental distractions; Slow rate; Small bites/sips Postural changes: Partially reclined for meals (45*) Oral care recommendations: Oral care QID (4x/day) Recommended consults: Consider  infusion Inject 1,800 Units/hr into the vein continuous.   insulin aspart 100 UNIT/ML injection Commonly known as: novoLOG Inject 0-9 Units into the skin 3 (three) times daily with meals.   lip balm ointment Apply topically as needed.   loperamide HCl 1 MG/7.5ML suspension Commonly known as: IMODIUM Take 30 mLs (4 mg total) by mouth 4 (four) times daily.   metoprolol tartrate 50 MG tablet Commonly known as: LOPRESSOR Take 1 tablet (50 mg total) by mouth 2 (two) times daily.   metroNIDAZOLE 500 MG/100ML Commonly known as: FLAGYL Inject 100 mLs (500 mg total) into the vein 2 (two) times daily.   oxyCODONE 5 MG immediate release tablet Commonly known as: Oxy IR/ROXICODONE Take 1-2 tablets (5-10 mg total) by mouth every 4 (four) hours as needed for breakthrough pain.   Vitamin D (Ergocalciferol) 1.25 MG (50000 UNIT) Caps capsule Commonly known as: DRISDOL Take 1 capsule (50,000 Units total) by mouth every 7 (seven) days. Start taking on: January 18, 2023               Discharge Care Instructions  (From admission, onward)           Start     Ordered   01/12/23 0000  Discharge wound care:       Comments: Apply moist  fluffed gauze to abd wound Q day, then cover with ABD pad and tape.   01/12/23 1359            Discharge Exam: Filed Weights   01/09/23 0500 01/10/23 0700 01/11/23 0500  Weight: 54.9 kg 51 kg 51 kg   General exam: Appears calm and comfortable  Respiratory system: Clear to auscultation. Respiratory effort normal. Cardiovascular system: S1 & S2 heard, RRR.  Gastrointestinal system: Abdomen is SOFT, bandaged. Drain placement. Central nervous system: Alert and oriented. No focal neurological deficits. Extremities: Symmetric 5 x 5 power. Skin: No rashes,  Psychiatry: Mood & affect appropriate.    Condition at discharge: fair  The results of significant diagnostics from this hospitalization (including imaging, microbiology, ancillary and laboratory) are listed below for reference.   Imaging Studies: DG Swallowing Func-Speech Pathology  Result Date: 01/12/2023 Table formatting from the original result was not included. Modified Barium Swallow Study Patient Details Name: [name] MRN: [id number] Date of Birth: [birth year] Today's Date: 01/12/2023 HPI/PMH: HPI: Per CCM note "47 year old female with past medical history of recurrent breast cancer not currently on treatment, Crohn's disease s/p subtotal colectomy in 2021 recurrent obstructions, colonic stricture, IDA, recent hospitalization 8/10-8/15/2024 for obstruction seen by GI and general surgery. Underwent EGD and flexible sigmoidoscopy and discharged on steroids who presented to the emergency department on 12/12/2022 with abdominal pain, nausea, vomiting diarrhea. In the ED tachycardic, hypertensive. ED provider notes abdominal distention. She was given 2L IVF, morphine, IV PPI. Labs with WBC 10.8, Hgb 10.7, K 3.2. CT AP was ordered and showed markedly dilated distal small bowel consistent with chronic obstruction. 12/13/2022 patient was found on the floor in the bathroom in ED pulseless. They estimate downtime around 5 minutes. ACLS was  initiated. Concern for possible torsades de pointes and given IV mag, defibrillated. Coded for around 5 minutes. She was intubated. She had copious vomitus in the ETT. ROSC achieved and ICU was consulted. Notably, she had I-stat peri-code which showed hemoglobin of 5.5 down from 10 overnight."  Pt was extubated 12/18/2021.  Pt has been maintained on TPN and is now getting trickle feeds for nutrition. SLP following for dysphagia management.  Physician Discharge Summary   Patient: [name] MRN: [id number] DOB: 09/19/1975  Admit date:     12/12/2022  Discharge date: 01/12/23  Discharge Physician: Kathlen Mody   PCP: Ollen Bowl, MD   Recommendations at discharge:  Recommend to continue with TPN.  sterile water SOLN 40.9 mL with Travasol 10 % SOLN 94.08 g, dextrose 70 % SOLN 235.2 g, sodium ACETATE 2 MEQ/ML SOLN 46.3 mEq, sodium chloride 4 MEQ/ML SOLN 121.7 mEq, potassium ACETATE 2 MEQ/ML SOLN 30.24 mEq, potassium PHOSPHATE 3 mmol/mL SOLN 25.2 mmol, calcium GLUCONATE INJ 8.4 mEq, magnesium SULFATE INJ 16.8 mEq, M.V.I. Adult INJ 10 mL, trace elements SOLN 1 mL, fat emul(SMOFlipid) 20 % EMUL 50.4 g   Recommend follow up with general surgeon  Recommend follow up the cultures from the abscess.  Recommend follow up with ID.   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   Port-A-Cath in place   Breast cancer (HCC)   Leukocytosis   Protein-calorie malnutrition, severe (HCC)   Failure to thrive in adult   Anorexia   Cardiac arrest (HCC)   Septic shock (HCC)   SBO with strangulation s/p resection 12/14/2022   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   Malnutrition of moderate degree   Palliative care by specialist   Abdominal pain   Fever, unspecified   Acute pulmonary embolism without acute cor pulmonale (HCC)  Resolved Problems:   Exacerbation of Crohn's disease of large intestine (HCC)   History of Crohn's disease   Colonic stricture (HCC)   Small bowel obstruction (HCC)   SBO (small bowel obstruction) (HCC)   Non-traumatic compartment syndrome of abdomen   Malnutrition  of moderate degree   History of resection of small bowel   Crohn's disease of small intestine with intestinal obstruction [facility])  Hospital Course:  KACY HEGNA is a 47 y.o. female with PMH significant for Crohn's disease with previous bowel obstruction s/p subtotal colectomy 2021, colonic stricture, IDA, breast cancer s/p mastectomy 2022 currently not on treatment. Recently hospitalized 8/10-8/15/2024 for bowel obstruction seen by GI and general surgery. Underwent EGD and flexible sigmoidoscopy and discharged on steroids.   9/3, patient presented to the ED with abdominal distention, pain, nausea, vomiting CT abd/pelvis showed markedly dilated distal small bowel consistent with chronic obstruction. No changes since prior study.  Patient was admitted to [facility] with a plan of GI consultation.   9/4, in the morning, within few hours of admission, patient was found pulseless on the floor in the bathroom in ED, estimated downtime about 5 minutes. ACLS was initiated. Concern for possible torsades de pointes and given IV mag, defibrillated.  ROSC achieved in about 5 minutes. She was intubated. She had copious vomitus in the ETT.  Notably, she had I-stat peri-code which showed hemoglobin of 5.5 down from 10 overnight.  Admitted to ICU In the ICU, patient had right femoral CVC placed.  Given 3 minutes of PRBC, 3 units of FFP.  Patient was started on vasopressors   9/5, underwent exploratory laparotomy.  Noted to have internal hernia with associated small bowel ischemia.  Underwent bowel resection. 9/7, back to OR for ostomy placement 9/8, weaned  the stomach. Left-sided needle accessed port in place with the tip at the cavoatrial junction. No pleural effusion. No pneumothorax. No new focal airspace opacity. Slightly improved airspace opacities in the right mid lung field. Unchanged cardiac and mediastinal contours. No radiographically apparent displaced rib fractures. IMPRESSION: 1. Slightly improved airspace opacities in the right mid lung field. 2. Enteric tube side hole is near the GE junction. Recommend slight advancement. Electronically Signed   By: Lorenza Cambridge M.D.   On: 12/16/2022 11:14   DG CHEST PORT 1 VIEW  Result  Date: 12/15/2022 CLINICAL DATA:  Central line placement. EXAM: PORTABLE CHEST 1 VIEW COMPARISON:  Same day. FINDINGS: Interval placement of right subclavian catheter with distal tip in expected position of the SVC. No pneumothorax is noted. Stable bilateral lung opacities as noted on prior exam. IMPRESSION: Interval placement of right subclavian catheter with distal tip in expected position of the SVC. Electronically Signed   By: Lupita Raider M.D.   On: 12/15/2022 15:57   ECHOCARDIOGRAM COMPLETE  Result Date: 12/15/2022    ECHOCARDIOGRAM REPORT   Patient Name:   [name] Date of Exam: 12/15/2022 Medical Rec #:  [id number]        Height:       64.0 in Accession #:    [id number]       Weight:       131.8 lb Date of Birth:  01-05-1976        BSA:          1.639 m Patient Age:    47 years         BP:           123/84 mmHg Patient Gender: F                HR:           85 bpm. Exam Location:  Inpatient Procedure: 2D Echo, Color Doppler and Cardiac Doppler Indications:    Shock  History:        Patient has prior history of Echocardiogram examinations, most                 recent 12/13/2022. Breast CA, family hx breast CA, Crohn's                 disease.  Sonographer:    [name] Referring Phys: [id number] PETER E BABCOCK  Sonographer Comments: Suboptimal apical window and echo performed with patient supine and on artificial respirator. Image acquisition challenging due to patient body habitus, Image acquisition challenging due to respiratory motion and Image acquisition challenging due to breast implants. IMPRESSIONS  1. Left ventricular ejection fraction, by estimation, is 60 to 65%. The left ventricle has normal function. The left ventricle has no regional wall motion abnormalities. Left ventricular diastolic parameters are indeterminate. There is incoordiante septal motion.  2. Right ventricular systolic function is low normal. The right ventricular size is normal. There is normal pulmonary artery systolic  pressure. The estimated right ventricular systolic pressure is 23.7 mmHg.  3. The mitral valve is normal in structure. No evidence of mitral valve regurgitation. No evidence of mitral stenosis.  4. The tricuspid valve is abnormal. Tricuspid valve regurgitation is mild to moderate.  5. The aortic valve is tricuspid. Aortic valve regurgitation is not visualized. No aortic stenosis is present.  6. The inferior vena cava is normal in size with <50% respiratory variability, suggesting right atrial pressure of 8 mmHg. Comparison(s): Changes from prior study  Brachial      Full       Yes       Yes                                   +----------+------------+---------+-----------+----------+--------------------+ Radial        Full                                                       +----------+------------+---------+-----------+----------+--------------------+ Ulnar         Full                                                       +----------+------------+---------+-----------+----------+--------------------+ Cephalic      Full                                                       +----------+------------+---------+-----------+----------+--------------------+ Basilic       Full       Yes       Yes                                   +----------+------------+---------+-----------+----------+--------------------+ Limited visualization of subclavian vein due to port placement. Cephalic vein not well visualized due to size of vessel.  Summary: No evidence of deep vein or superficial vein thrombosis involving the right and left upper extremities. Right: However, unable to visualize the subclavian vein.  *See table(s) above for measurements and observations.  Diagnosing physician: Coral Else MD Electronically signed by Coral Else MD on 12/30/2022 at 10:08:06 AM.    Final    DG CHEST PORT 1 VIEW  Result Date:  12/27/2022 CLINICAL DATA:  Fever EXAM: PORTABLE CHEST 1 VIEW COMPARISON:  12/22/2022 FINDINGS: Enteric tube extends below the diaphragm with distal tip terminating within the third portion of the duodenum. Left chest port and right subclavian central venous catheter remain appropriately positioned. Normal heart size. Left greater than right bibasilar opacities with bilateral pleural effusions, slightly improved. No pneumothorax. IMPRESSION: Left greater than right bibasilar opacities with bilateral pleural effusions, slightly improved. Electronically Signed   By: Duanne Guess D.O.   On: 12/27/2022 13:43   VAS [place] LOWER EXTREMITY VENOUS (DVT)  Result Date: 12/27/2022  Lower Venous DVT Study Patient Name:  [name]  Date of Exam:   12/27/2022 Medical Rec #: [id number]         Accession #:    [id number] Date of Birth: 02-Oct-1975         Patient Gender: F Patient Age:   42 years Exam Location:  [facility] Procedure:      VAS [place] LOWER EXTREMITY VENOUS (DVT) Referring Phys: MICHAEL MACZIS --------------------------------------------------------------------------------  Indications: Pulmonary embolism.  Risk Factors: Confirmed PE. Limitations: Poor ultrasound/tissue interface, bandages, open wound and patient positioning. Comparison Study: 12/14/2022, 12/20/2022 - Negative for DVT. Performing Technologist: Chanda Busing  implant noted. Hepatobiliary: No focal liver abnormality is seen. No gallstones, gallbladder wall thickening, or biliary dilatation. Pancreas: Mild dilation of the pancreatic duct noted. No obstructing lesion is present. Spleen: Normal in size without focal abnormality. Adrenals/Urinary Tract: The adrenal glands are normal bilaterally. Kidneys are unremarkable. No stone or mass lesion is present. No obstruction is present. A Foley catheter is present within the urinary bladder. Stomach/Bowel: The small bore feeding tube is in place. The tip is in the distal duodenum. Dilated loops of proximal small bowel measure up to 3.7 cm. Just distal to the dilated segment marked small bowel wall thickening is present which continues to the level of the left lower quadrant ileostomy. Extensive small bowel resection and partial colectomy noted. Vascular/Lymphatic: The aorta is unremarkable. Subcentimeter periaortic lymph nodes are similar the prior exam, likely reactive. Are. Reproductive: Fibroid uterus is again noted.  The adnexa Other: Diffuse peritoneal thickening is now present. Locules of gas are present in the right lower quadrant. A small amount of  free air remains. Musculoskeletal: The vertebral body heights and alignment are normal. No focal osseous abnormality is present. IMPRESSION: 1. Dilated loops of proximal small bowel measure up to 3.7 cm. Just distal to the dilated segment marked small bowel wall thickening is marked small bowel wall thickening which continues to the level of the left lower quadrant ileostomy. This raises concern for early developing obstruction. Findings are also concerning for bowel ischemia in this patient previous ischemic bowel. 2. Diffuse peritoneal thickening is concerning for peritonitis. 3. Persistent pneumoperitoneum is unexpected 15 days after surgery, raising concern infection. 4. Moderate right-sided pleural effusion is again seen. Left pleural effusion is improved. 5. Focal airspace consolidation at the left base is concerning for pneumonia. 6. Fibroid uterus. These results were called by telephone at the time of interpretation on 12/31/2022 at 11:04 am to provider [facility] , who verbally acknowledged these results. Electronically Signed   By: Marin Roberts M.D.   On: 12/31/2022 11:19   VAS [place] UPPER EXTREMITY VENOUS DUPLEX  Result Date: 12/30/2022 UPPER VENOUS STUDY  Patient Name:  [name]  Date of Exam:   12/28/2022 Medical Rec #: [id number]         Accession #:    [id number] Date of Birth: Jul 26, 1975         Patient Gender: F Patient Age:   47 years Exam Location:  [facility] Procedure:      VAS [place] UPPER EXTREMITY VENOUS DUPLEX Referring Phys: JEFFREY HATCHER --------------------------------------------------------------------------------  Indications: PE not on anticoagulation Limitations: Line and port placement. Comparison Study: No previous UE exams Performing Technologist: Jody Hill RVT, RDMS  Examination Guidelines: A complete evaluation includes B-mode imaging, spectral Doppler, color Doppler, and power Doppler as needed of all accessible portions of each vessel. Bilateral  testing is considered an integral part of a complete examination. Limited examinations for reoccurring indications may be performed as noted.  Right Findings: +----------+------------+---------+-----------+----------+--------------+ RIGHT     CompressiblePhasicitySpontaneousProperties   Summary     +----------+------------+---------+-----------+----------+--------------+ IJV           Full       Yes       Yes                             +----------+------------+---------+-----------+----------+--------------+ Subclavian  Brachial      Full       Yes       Yes                                   +----------+------------+---------+-----------+----------+--------------------+ Radial        Full                                                       +----------+------------+---------+-----------+----------+--------------------+ Ulnar         Full                                                       +----------+------------+---------+-----------+----------+--------------------+ Cephalic      Full                                                       +----------+------------+---------+-----------+----------+--------------------+ Basilic       Full       Yes       Yes                                   +----------+------------+---------+-----------+----------+--------------------+ Limited visualization of subclavian vein due to port placement. Cephalic vein not well visualized due to size of vessel.  Summary: No evidence of deep vein or superficial vein thrombosis involving the right and left upper extremities. Right: However, unable to visualize the subclavian vein.  *See table(s) above for measurements and observations.  Diagnosing physician: Coral Else MD Electronically signed by Coral Else MD on 12/30/2022 at 10:08:06 AM.    Final    DG CHEST PORT 1 VIEW  Result Date:  12/27/2022 CLINICAL DATA:  Fever EXAM: PORTABLE CHEST 1 VIEW COMPARISON:  12/22/2022 FINDINGS: Enteric tube extends below the diaphragm with distal tip terminating within the third portion of the duodenum. Left chest port and right subclavian central venous catheter remain appropriately positioned. Normal heart size. Left greater than right bibasilar opacities with bilateral pleural effusions, slightly improved. No pneumothorax. IMPRESSION: Left greater than right bibasilar opacities with bilateral pleural effusions, slightly improved. Electronically Signed   By: Duanne Guess D.O.   On: 12/27/2022 13:43   VAS [place] LOWER EXTREMITY VENOUS (DVT)  Result Date: 12/27/2022  Lower Venous DVT Study Patient Name:  [name]  Date of Exam:   12/27/2022 Medical Rec #: [id number]         Accession #:    [id number] Date of Birth: 02-Oct-1975         Patient Gender: F Patient Age:   42 years Exam Location:  [facility] Procedure:      VAS [place] LOWER EXTREMITY VENOUS (DVT) Referring Phys: MICHAEL MACZIS --------------------------------------------------------------------------------  Indications: Pulmonary embolism.  Risk Factors: Confirmed PE. Limitations: Poor ultrasound/tissue interface, bandages, open wound and patient positioning. Comparison Study: 12/14/2022, 12/20/2022 - Negative for DVT. Performing Technologist: Chanda Busing  Brachial      Full       Yes       Yes                                   +----------+------------+---------+-----------+----------+--------------------+ Radial        Full                                                       +----------+------------+---------+-----------+----------+--------------------+ Ulnar         Full                                                       +----------+------------+---------+-----------+----------+--------------------+ Cephalic      Full                                                       +----------+------------+---------+-----------+----------+--------------------+ Basilic       Full       Yes       Yes                                   +----------+------------+---------+-----------+----------+--------------------+ Limited visualization of subclavian vein due to port placement. Cephalic vein not well visualized due to size of vessel.  Summary: No evidence of deep vein or superficial vein thrombosis involving the right and left upper extremities. Right: However, unable to visualize the subclavian vein.  *See table(s) above for measurements and observations.  Diagnosing physician: Coral Else MD Electronically signed by Coral Else MD on 12/30/2022 at 10:08:06 AM.    Final    DG CHEST PORT 1 VIEW  Result Date:  12/27/2022 CLINICAL DATA:  Fever EXAM: PORTABLE CHEST 1 VIEW COMPARISON:  12/22/2022 FINDINGS: Enteric tube extends below the diaphragm with distal tip terminating within the third portion of the duodenum. Left chest port and right subclavian central venous catheter remain appropriately positioned. Normal heart size. Left greater than right bibasilar opacities with bilateral pleural effusions, slightly improved. No pneumothorax. IMPRESSION: Left greater than right bibasilar opacities with bilateral pleural effusions, slightly improved. Electronically Signed   By: Duanne Guess D.O.   On: 12/27/2022 13:43   VAS [place] LOWER EXTREMITY VENOUS (DVT)  Result Date: 12/27/2022  Lower Venous DVT Study Patient Name:  [name]  Date of Exam:   12/27/2022 Medical Rec #: [id number]         Accession #:    [id number] Date of Birth: 02-Oct-1975         Patient Gender: F Patient Age:   42 years Exam Location:  [facility] Procedure:      VAS [place] LOWER EXTREMITY VENOUS (DVT) Referring Phys: MICHAEL MACZIS --------------------------------------------------------------------------------  Indications: Pulmonary embolism.  Risk Factors: Confirmed PE. Limitations: Poor ultrasound/tissue interface, bandages, open wound and patient positioning. Comparison Study: 12/14/2022, 12/20/2022 - Negative for DVT. Performing Technologist: Chanda Busing  visible pleural effusions or pneumothorax. No acute osseous abnormality. Left breast implant. Improved aeration lung IMPRESSION: Bases with minimal streaky left basilar opacities. Electronically Signed   By: Feliberto Harts M.D.   On:  01/03/2023 14:31   DG Swallowing Func-Speech Pathology  Result Date: 01/02/2023 Table formatting from the original result was not included. Modified Barium Swallow Study Patient Details Name: [name] MRN: [id number] Date of Birth: 12-15-1975 Today's Date: 12/29/2022 HPI/PMH: HPI: Per CCM note "47 year old female with past medical history of recurrent breast cancer not currently on treatment, Crohn's disease s/p subtotal colectomy in 2021 recurrent obstructions, colonic stricture, IDA, recent hospitalization 8/10-8/15/2024 for obstruction seen by GI and general surgery. Underwent EGD and flexible sigmoidoscopy and discharged on steroids who presented to the emergency department on 12/12/2022 with abdominal pain, nausea, vomiting diarrhea. In the ED tachycardic, hypertensive. ED provider notes abdominal distention. She was given 2L IVF, morphine, IV PPI. Labs with WBC 10.8, Hgb 10.7, K 3.2. CT AP was ordered and showed markedly dilated distal small bowel consistent with chronic obstruction. No changes since prior study. Patient was admitted to [facility]. They consulted GI for input. Unfortunately, around 0820 9/4 patient was found on the floor in the bathroom in ED pulseless. They estimate downtime around 5 minutes. ACLS was initiated. Concern for possible torsades de pointes and given IV mag, defibrillated. Coded for around 5 minutes. She was intubated. She had copious vomitus in the ETT. ROSC achieved and ICU was consulted. Notably, she had I-stat peri-code which showed hemoglobin of 5.5 down from 10 overnight."  Pt was extubated 12/18/2021.  Pt has been maintained on TPN and is now getting trickle feeds for nutrition. SLP following for dysphagia management. Clinical Impression: Clinical Impression: Today patient presents with improvement in swallow function compared to prior approx six days ago.  Mild pharyngeal dysphagia remains with inconsistent penetration of nectar/thin and mild aspiration of thin mixed with  secretions without sensation. However shallow aspiration and penetration was largely cleared with cued throat clear and re=swallow.  Deeper aspirates were not cleared with cued cough as cough remains weak and ineffective.  Pt's dysphagia continues to be characterized by impaired timing and adequacy of laryngeal closure/elevation.  Posterior posture at 45* helpful to retain boluses in posterior pharynx and subsequently decreasing airway risk. Did not remove feeding tube as did not appear to negatively impact swallow function significantly.  Recommend intiiate po diet of dys3/nectar thick liquids via tsp with strict precautions.  When pt is clearing her throat with po intake - she is likely penetrating at least to vocal fold level based on this test finding. Pt will require strict monitoring of po tolerance given silent nature of dysphagia and weak cough.  SLP will continue to follow for swallow exercises to mitigate dysphagia. Factors that may increase risk of adverse event in presence of aspiration Rubye Oaks & [name] 2021): Factors that may increase risk of adverse event in presence of aspiration Rubye Oaks & [name] 2021): Poor general health and/or compromised immunity; Respiratory or GI disease; Limited mobility; Weak cough; Frequent aspiration of large volumes Recommendations/Plan: Swallowing Evaluation Recommendations Swallowing Evaluation Recommendations Recommendations: PO diet PO Diet Recommendation: Dysphagia 1 (Pureed); Dysphagia 3 (Mechanical soft); Mildly thick liquids (Level 2, nectar thick) Liquid Administration via: Spoon Medication Administration: Via alternative means (or with PUDDING) Supervision: Full assist for feeding Swallowing strategies  : Minimize environmental distractions; Slow rate; Small bites/sips Postural changes: Partially reclined for meals (45*) Oral care recommendations: Oral care QID (4x/day) Recommended consults: Consider  the stomach. Left-sided needle accessed port in place with the tip at the cavoatrial junction. No pleural effusion. No pneumothorax. No new focal airspace opacity. Slightly improved airspace opacities in the right mid lung field. Unchanged cardiac and mediastinal contours. No radiographically apparent displaced rib fractures. IMPRESSION: 1. Slightly improved airspace opacities in the right mid lung field. 2. Enteric tube side hole is near the GE junction. Recommend slight advancement. Electronically Signed   By: Lorenza Cambridge M.D.   On: 12/16/2022 11:14   DG CHEST PORT 1 VIEW  Result  Date: 12/15/2022 CLINICAL DATA:  Central line placement. EXAM: PORTABLE CHEST 1 VIEW COMPARISON:  Same day. FINDINGS: Interval placement of right subclavian catheter with distal tip in expected position of the SVC. No pneumothorax is noted. Stable bilateral lung opacities as noted on prior exam. IMPRESSION: Interval placement of right subclavian catheter with distal tip in expected position of the SVC. Electronically Signed   By: Lupita Raider M.D.   On: 12/15/2022 15:57   ECHOCARDIOGRAM COMPLETE  Result Date: 12/15/2022    ECHOCARDIOGRAM REPORT   Patient Name:   [name] Date of Exam: 12/15/2022 Medical Rec #:  [id number]        Height:       64.0 in Accession #:    [id number]       Weight:       131.8 lb Date of Birth:  01-05-1976        BSA:          1.639 m Patient Age:    47 years         BP:           123/84 mmHg Patient Gender: F                HR:           85 bpm. Exam Location:  Inpatient Procedure: 2D Echo, Color Doppler and Cardiac Doppler Indications:    Shock  History:        Patient has prior history of Echocardiogram examinations, most                 recent 12/13/2022. Breast CA, family hx breast CA, Crohn's                 disease.  Sonographer:    [name] Referring Phys: [id number] PETER E BABCOCK  Sonographer Comments: Suboptimal apical window and echo performed with patient supine and on artificial respirator. Image acquisition challenging due to patient body habitus, Image acquisition challenging due to respiratory motion and Image acquisition challenging due to breast implants. IMPRESSIONS  1. Left ventricular ejection fraction, by estimation, is 60 to 65%. The left ventricle has normal function. The left ventricle has no regional wall motion abnormalities. Left ventricular diastolic parameters are indeterminate. There is incoordiante septal motion.  2. Right ventricular systolic function is low normal. The right ventricular size is normal. There is normal pulmonary artery systolic  pressure. The estimated right ventricular systolic pressure is 23.7 mmHg.  3. The mitral valve is normal in structure. No evidence of mitral valve regurgitation. No evidence of mitral stenosis.  4. The tricuspid valve is abnormal. Tricuspid valve regurgitation is mild to moderate.  5. The aortic valve is tricuspid. Aortic valve regurgitation is not visualized. No aortic stenosis is present.  6. The inferior vena cava is normal in size with <50% respiratory variability, suggesting right atrial pressure of 8 mmHg. Comparison(s): Changes from prior study  small amount of pneumoperitoneum within the central  upper abdomen. This is nonspecific given recent surgical intervention. 3. Large volume ascites. 4. Bilateral pleural effusions with underlying compressive atelectasis, right greater than left. 5. Diffuse subcutaneous edema. These results were called by telephone at the time of interpretation on 12/25/2022 at 457 pm to provider Chevis Pretty, who verbally acknowledged these results. Electronically Signed   By: Sharlet Salina M.D.   On: 12/25/2022 17:03   DG Abd 1 View  Result Date: 12/23/2022 CLINICAL DATA:  NG tube placement. EXAM: ABDOMEN - 1 VIEW COMPARISON:  12/19/2022. FINDINGS: Enteric tube passes below the diaphragm, through the stomach into the right mid abdomen projecting the level of the duodenal bulb/pylorus. No bowel dilation. IMPRESSION: 1. Enteric feeding tube tip projects the level of the pylorus/duodenal bulb. Electronically Signed   By: Amie Portland M.D.   On: 12/23/2022 15:12   DG Swallowing Func-Speech Pathology  Result Date: 12/23/2022 Table formatting from the original result was not included. Modified Barium Swallow Study Patient Details Name: [name] MRN: [id number] Date of Birth: [birth year] Today's Date: 12/23/2022 HPI/PMH: HPI: Per CCM note "47 year old female with past medical history of recurrent breast cancer not currently on treatment, Crohn's disease s/p subtotal colectomy in 2021 recurrent obstructions, colonic stricture, IDA, recent hospitalization 8/10-8/15/2024 for obstruction seen by GI and general surgery. Underwent EGD and flexible sigmoidoscopy and discharged on steroids who presented to the emergency department on 12/12/2022 with abdominal pain, nausea, vomiting diarrhea. In the ED tachycardic, hypertensive. ED provider notes abdominal distention. She was given 2L IVF, morphine, IV PPI. Labs with WBC 10.8, Hgb 10.7, K 3.2. CT AP was ordered and showed markedly dilated distal small bowel consistent with chronic obstruction. No changes since prior study. Patient was  admitted to [facility]. They consulted GI for input. Unfortunately, around 0820 9/4 patient was found on the floor in the bathroom in ED pulseless. They estimate downtime around 5 minutes. ACLS was initiated. Concern for possible torsades de pointes and given IV mag, defibrillated. Coded for around 5 minutes. She was intubated. She had copious vomitus in the ETT. ROSC achieved and ICU was consulted. Notably, she had I-stat peri-code which showed hemoglobin of 5.5 down from 10 overnight."  Pt was extubated 12/18/2021. Clinical Impression: Clinical Impression: MBS was completed in the laterial projection using barium impregnated thin liquids via spoon, mildly thick liquid via spoon and cup and pureed material.  She presented with an oral and pharyngeal dysphagia.  Aspiration with a weak cough response was seen given thin liquids via spoon and mildly thick liquids via self fed cup sips.  Suggest patient remain NPO with comfort feeds of 1/2 tsp bites of pureed material and spoon sips of mildly thick liquids.  Oral care needs to be diligent and compeleted 2-3 t imes per day using a toothbrush and toothpaste.  ST will follow for swallowing therapy.  Please see below for more information regarding swallowing physiology.  ORAL PHASE  1.  Poor lingual control during directed bolus hold.  She was noted to lose material into the buccal area and/or under the tongue prior to the direction to swallow.  This lack of control led to premature loss of the bolus with thin and mildly thick liquids resonating in the pyriform sinuses and pureed material resonating in the vallecular prior to the swallow trigger.  2.  Repetitive/disorganized lingual motion was seen leading to a delay in A/P transport.    PHARYNGEAL PHASE  1.  Significantly reduced  implant noted. Hepatobiliary: No focal liver abnormality is seen. No gallstones, gallbladder wall thickening, or biliary dilatation. Pancreas: Mild dilation of the pancreatic duct noted. No obstructing lesion is present. Spleen: Normal in size without focal abnormality. Adrenals/Urinary Tract: The adrenal glands are normal bilaterally. Kidneys are unremarkable. No stone or mass lesion is present. No obstruction is present. A Foley catheter is present within the urinary bladder. Stomach/Bowel: The small bore feeding tube is in place. The tip is in the distal duodenum. Dilated loops of proximal small bowel measure up to 3.7 cm. Just distal to the dilated segment marked small bowel wall thickening is present which continues to the level of the left lower quadrant ileostomy. Extensive small bowel resection and partial colectomy noted. Vascular/Lymphatic: The aorta is unremarkable. Subcentimeter periaortic lymph nodes are similar the prior exam, likely reactive. Are. Reproductive: Fibroid uterus is again noted.  The adnexa Other: Diffuse peritoneal thickening is now present. Locules of gas are present in the right lower quadrant. A small amount of  free air remains. Musculoskeletal: The vertebral body heights and alignment are normal. No focal osseous abnormality is present. IMPRESSION: 1. Dilated loops of proximal small bowel measure up to 3.7 cm. Just distal to the dilated segment marked small bowel wall thickening is marked small bowel wall thickening which continues to the level of the left lower quadrant ileostomy. This raises concern for early developing obstruction. Findings are also concerning for bowel ischemia in this patient previous ischemic bowel. 2. Diffuse peritoneal thickening is concerning for peritonitis. 3. Persistent pneumoperitoneum is unexpected 15 days after surgery, raising concern infection. 4. Moderate right-sided pleural effusion is again seen. Left pleural effusion is improved. 5. Focal airspace consolidation at the left base is concerning for pneumonia. 6. Fibroid uterus. These results were called by telephone at the time of interpretation on 12/31/2022 at 11:04 am to provider [facility] , who verbally acknowledged these results. Electronically Signed   By: Marin Roberts M.D.   On: 12/31/2022 11:19   VAS [place] UPPER EXTREMITY VENOUS DUPLEX  Result Date: 12/30/2022 UPPER VENOUS STUDY  Patient Name:  [name]  Date of Exam:   12/28/2022 Medical Rec #: [id number]         Accession #:    [id number] Date of Birth: Jul 26, 1975         Patient Gender: F Patient Age:   47 years Exam Location:  [facility] Procedure:      VAS [place] UPPER EXTREMITY VENOUS DUPLEX Referring Phys: JEFFREY HATCHER --------------------------------------------------------------------------------  Indications: PE not on anticoagulation Limitations: Line and port placement. Comparison Study: No previous UE exams Performing Technologist: Jody Hill RVT, RDMS  Examination Guidelines: A complete evaluation includes B-mode imaging, spectral Doppler, color Doppler, and power Doppler as needed of all accessible portions of each vessel. Bilateral  testing is considered an integral part of a complete examination. Limited examinations for reoccurring indications may be performed as noted.  Right Findings: +----------+------------+---------+-----------+----------+--------------+ RIGHT     CompressiblePhasicitySpontaneousProperties   Summary     +----------+------------+---------+-----------+----------+--------------+ IJV           Full       Yes       Yes                             +----------+------------+---------+-----------+----------+--------------+ Subclavian  Accession #:    [id number] Date of Birth: 1976/01/06         Patient Gender: F Patient Age:   83 years Exam Location:  [facility] Procedure:      VAS [place] LOWER EXTREMITY VENOUS (DVT) Referring Phys: Levon Hedger --------------------------------------------------------------------------------  Indications: Edema.  Risk Factors: None identified. Limitations: Bandages and patient positioning. Comparison Study: No prior studies.  Performing Technologist: Chanda Busing RVT  Examination Guidelines: A complete evaluation includes B-mode imaging, spectral Doppler, color Doppler, and power Doppler as needed of all accessible portions of each vessel. Bilateral testing is considered an integral part of a complete examination. Limited examinations for reoccurring indications may be performed as noted. The reflux portion of the exam is performed with the patient in reverse Trendelenburg.  +---------+---------------+---------+-----------+----------+-------------------+ RIGHT    CompressibilityPhasicitySpontaneityPropertiesThrombus Aging      +---------+---------------+---------+-----------+----------+-------------------+ CFV                                                   Not well visualized +---------+---------------+---------+-----------+----------+-------------------+ SFJ                                                   Not well visualized +---------+---------------+---------+-----------+----------+-------------------+ FV Prox  Full           Yes      Yes                                      +---------+---------------+---------+-----------+----------+-------------------+ FV Mid   Full                                                             +---------+---------------+---------+-----------+----------+-------------------+ FV DistalFull                                                             +---------+---------------+---------+-----------+----------+-------------------+ PFV                                                   Not well visualized +---------+---------------+---------+-----------+----------+-------------------+ POP      Full           Yes      Yes                                      +---------+---------------+---------+-----------+----------+-------------------+ PTV      Full                                                              +---------+---------------+---------+-----------+----------+-------------------+  infusion Inject 1,800 Units/hr into the vein continuous.   insulin aspart 100 UNIT/ML injection Commonly known as: novoLOG Inject 0-9 Units into the skin 3 (three) times daily with meals.   lip balm ointment Apply topically as needed.   loperamide HCl 1 MG/7.5ML suspension Commonly known as: IMODIUM Take 30 mLs (4 mg total) by mouth 4 (four) times daily.   metoprolol tartrate 50 MG tablet Commonly known as: LOPRESSOR Take 1 tablet (50 mg total) by mouth 2 (two) times daily.   metroNIDAZOLE 500 MG/100ML Commonly known as: FLAGYL Inject 100 mLs (500 mg total) into the vein 2 (two) times daily.   oxyCODONE 5 MG immediate release tablet Commonly known as: Oxy IR/ROXICODONE Take 1-2 tablets (5-10 mg total) by mouth every 4 (four) hours as needed for breakthrough pain.   Vitamin D (Ergocalciferol) 1.25 MG (50000 UNIT) Caps capsule Commonly known as: DRISDOL Take 1 capsule (50,000 Units total) by mouth every 7 (seven) days. Start taking on: January 18, 2023               Discharge Care Instructions  (From admission, onward)           Start     Ordered   01/12/23 0000  Discharge wound care:       Comments: Apply moist  fluffed gauze to abd wound Q day, then cover with ABD pad and tape.   01/12/23 1359            Discharge Exam: Filed Weights   01/09/23 0500 01/10/23 0700 01/11/23 0500  Weight: 54.9 kg 51 kg 51 kg   General exam: Appears calm and comfortable  Respiratory system: Clear to auscultation. Respiratory effort normal. Cardiovascular system: S1 & S2 heard, RRR.  Gastrointestinal system: Abdomen is SOFT, bandaged. Drain placement. Central nervous system: Alert and oriented. No focal neurological deficits. Extremities: Symmetric 5 x 5 power. Skin: No rashes,  Psychiatry: Mood & affect appropriate.    Condition at discharge: fair  The results of significant diagnostics from this hospitalization (including imaging, microbiology, ancillary and laboratory) are listed below for reference.   Imaging Studies: DG Swallowing Func-Speech Pathology  Result Date: 01/12/2023 Table formatting from the original result was not included. Modified Barium Swallow Study Patient Details Name: [name] MRN: [id number] Date of Birth: [birth year] Today's Date: 01/12/2023 HPI/PMH: HPI: Per CCM note "47 year old female with past medical history of recurrent breast cancer not currently on treatment, Crohn's disease s/p subtotal colectomy in 2021 recurrent obstructions, colonic stricture, IDA, recent hospitalization 8/10-8/15/2024 for obstruction seen by GI and general surgery. Underwent EGD and flexible sigmoidoscopy and discharged on steroids who presented to the emergency department on 12/12/2022 with abdominal pain, nausea, vomiting diarrhea. In the ED tachycardic, hypertensive. ED provider notes abdominal distention. She was given 2L IVF, morphine, IV PPI. Labs with WBC 10.8, Hgb 10.7, K 3.2. CT AP was ordered and showed markedly dilated distal small bowel consistent with chronic obstruction. 12/13/2022 patient was found on the floor in the bathroom in ED pulseless. They estimate downtime around 5 minutes. ACLS was  initiated. Concern for possible torsades de pointes and given IV mag, defibrillated. Coded for around 5 minutes. She was intubated. She had copious vomitus in the ETT. ROSC achieved and ICU was consulted. Notably, she had I-stat peri-code which showed hemoglobin of 5.5 down from 10 overnight."  Pt was extubated 12/18/2021.  Pt has been maintained on TPN and is now getting trickle feeds for nutrition. SLP following for dysphagia management.  Brachial      Full       Yes       Yes                                   +----------+------------+---------+-----------+----------+--------------------+ Radial        Full                                                       +----------+------------+---------+-----------+----------+--------------------+ Ulnar         Full                                                       +----------+------------+---------+-----------+----------+--------------------+ Cephalic      Full                                                       +----------+------------+---------+-----------+----------+--------------------+ Basilic       Full       Yes       Yes                                   +----------+------------+---------+-----------+----------+--------------------+ Limited visualization of subclavian vein due to port placement. Cephalic vein not well visualized due to size of vessel.  Summary: No evidence of deep vein or superficial vein thrombosis involving the right and left upper extremities. Right: However, unable to visualize the subclavian vein.  *See table(s) above for measurements and observations.  Diagnosing physician: Coral Else MD Electronically signed by Coral Else MD on 12/30/2022 at 10:08:06 AM.    Final    DG CHEST PORT 1 VIEW  Result Date:  12/27/2022 CLINICAL DATA:  Fever EXAM: PORTABLE CHEST 1 VIEW COMPARISON:  12/22/2022 FINDINGS: Enteric tube extends below the diaphragm with distal tip terminating within the third portion of the duodenum. Left chest port and right subclavian central venous catheter remain appropriately positioned. Normal heart size. Left greater than right bibasilar opacities with bilateral pleural effusions, slightly improved. No pneumothorax. IMPRESSION: Left greater than right bibasilar opacities with bilateral pleural effusions, slightly improved. Electronically Signed   By: Duanne Guess D.O.   On: 12/27/2022 13:43   VAS [place] LOWER EXTREMITY VENOUS (DVT)  Result Date: 12/27/2022  Lower Venous DVT Study Patient Name:  [name]  Date of Exam:   12/27/2022 Medical Rec #: [id number]         Accession #:    [id number] Date of Birth: 02-Oct-1975         Patient Gender: F Patient Age:   42 years Exam Location:  [facility] Procedure:      VAS [place] LOWER EXTREMITY VENOUS (DVT) Referring Phys: MICHAEL MACZIS --------------------------------------------------------------------------------  Indications: Pulmonary embolism.  Risk Factors: Confirmed PE. Limitations: Poor ultrasound/tissue interface, bandages, open wound and patient positioning. Comparison Study: 12/14/2022, 12/20/2022 - Negative for DVT. Performing Technologist: Chanda Busing  Physician Discharge Summary   Patient: [name] MRN: [id number] DOB: 09/19/1975  Admit date:     12/12/2022  Discharge date: 01/12/23  Discharge Physician: Kathlen Mody   PCP: Ollen Bowl, MD   Recommendations at discharge:  Recommend to continue with TPN.  sterile water SOLN 40.9 mL with Travasol 10 % SOLN 94.08 g, dextrose 70 % SOLN 235.2 g, sodium ACETATE 2 MEQ/ML SOLN 46.3 mEq, sodium chloride 4 MEQ/ML SOLN 121.7 mEq, potassium ACETATE 2 MEQ/ML SOLN 30.24 mEq, potassium PHOSPHATE 3 mmol/mL SOLN 25.2 mmol, calcium GLUCONATE INJ 8.4 mEq, magnesium SULFATE INJ 16.8 mEq, M.V.I. Adult INJ 10 mL, trace elements SOLN 1 mL, fat emul(SMOFlipid) 20 % EMUL 50.4 g   Recommend follow up with general surgeon  Recommend follow up the cultures from the abscess.  Recommend follow up with ID.   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   Port-A-Cath in place   Breast cancer (HCC)   Leukocytosis   Protein-calorie malnutrition, severe (HCC)   Failure to thrive in adult   Anorexia   Cardiac arrest (HCC)   Septic shock (HCC)   SBO with strangulation s/p resection 12/14/2022   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   Malnutrition of moderate degree   Palliative care by specialist   Abdominal pain   Fever, unspecified   Acute pulmonary embolism without acute cor pulmonale (HCC)  Resolved Problems:   Exacerbation of Crohn's disease of large intestine (HCC)   History of Crohn's disease   Colonic stricture (HCC)   Small bowel obstruction (HCC)   SBO (small bowel obstruction) (HCC)   Non-traumatic compartment syndrome of abdomen   Malnutrition  of moderate degree   History of resection of small bowel   Crohn's disease of small intestine with intestinal obstruction [facility])  Hospital Course:  KACY HEGNA is a 47 y.o. female with PMH significant for Crohn's disease with previous bowel obstruction s/p subtotal colectomy 2021, colonic stricture, IDA, breast cancer s/p mastectomy 2022 currently not on treatment. Recently hospitalized 8/10-8/15/2024 for bowel obstruction seen by GI and general surgery. Underwent EGD and flexible sigmoidoscopy and discharged on steroids.   9/3, patient presented to the ED with abdominal distention, pain, nausea, vomiting CT abd/pelvis showed markedly dilated distal small bowel consistent with chronic obstruction. No changes since prior study.  Patient was admitted to [facility] with a plan of GI consultation.   9/4, in the morning, within few hours of admission, patient was found pulseless on the floor in the bathroom in ED, estimated downtime about 5 minutes. ACLS was initiated. Concern for possible torsades de pointes and given IV mag, defibrillated.  ROSC achieved in about 5 minutes. She was intubated. She had copious vomitus in the ETT.  Notably, she had I-stat peri-code which showed hemoglobin of 5.5 down from 10 overnight.  Admitted to ICU In the ICU, patient had right femoral CVC placed.  Given 3 minutes of PRBC, 3 units of FFP.  Patient was started on vasopressors   9/5, underwent exploratory laparotomy.  Noted to have internal hernia with associated small bowel ischemia.  Underwent bowel resection. 9/7, back to OR for ostomy placement 9/8, weaned

## 2023-01-12 NOTE — Progress Notes (Signed)
Progress Note  7 Days Post-Op  Subjective: Pain at drain site. Issues with VAC overnight and this AM.   Objective: Vital signs in last 24 hours: Temp:  [98.4 F (36.9 C)-99 F (37.2 C)] 98.4 F (36.9 C) (10/04 1145) Pulse Rate:  [104-136] 108 (10/04 1145) Resp:  [16-22] 20 (10/04 1145) BP: (93-111)/(64-80) 100/69 (10/04 1145) SpO2:  [96 %-100 %] 100 % (10/04 1145) Last BM Date : 01/12/23  Intake/Output from previous day: 10/03 0701 - 10/04 0700 In: 2700.9 [I.V.:2040.9; NG/GT:60; IV Piggyback:600] Out: 2687 [Urine:1550; Drains:112; Stool:1025] Intake/Output this shift: Total I/O In: 60 [NG/GT:60] Out: -   PE: General appearance: alert and cooperative GI: soft, non-tender; bowel sounds normal; no masses,  no organomegaly, VAC removed from midline wound shallow as pictured below. Ostomy functioning and remains high output. Drain in place with SS fluid    Lab Results:  Recent Labs    01/11/23 0357 01/12/23 0601  WBC 21.6* 26.5*  HGB 7.7* 7.5*  HCT 25.5* 24.1*  PLT 425* 411*   BMET Recent Labs    01/11/23 0357 01/12/23 0601  NA 131* 132*  K 3.9 4.7  CL 99 102  CO2 23 23  GLUCOSE 152* 166*  BUN 17 30*  CREATININE 0.76 0.72  CALCIUM 8.5* 8.3*   PT/INR Recent Labs    01/11/23 1143  LABPROT 15.8*  INR 1.2   CMP     Component Value Date/Time   NA 132 (L) 01/12/2023 0601   NA 135 01/21/2020 1056   K 4.7 01/12/2023 0601   CL 102 01/12/2023 0601   CO2 23 01/12/2023 0601   GLUCOSE 166 (H) 01/12/2023 0601   BUN 30 (H) 01/12/2023 0601   BUN 12 01/21/2020 1056   CREATININE 0.72 01/12/2023 0601   CREATININE 0.91 09/21/2022 1141   CALCIUM 8.3 (L) 01/12/2023 0601   PROT 6.7 01/12/2023 0601   ALBUMIN 2.1 (L) 01/12/2023 0601   AST 28 01/12/2023 0601   AST 18 09/21/2022 1141   ALT 21 01/12/2023 0601   ALT 12 09/21/2022 1141   ALKPHOS 113 01/12/2023 0601   BILITOT 0.4 01/12/2023 0601   BILITOT 0.4 09/21/2022 1141   GFRNONAA >60 01/12/2023 0601    GFRNONAA >60 09/21/2022 1141   GFRAA 108 01/21/2020 1056   Lipase     Component Value Date/Time   LIPASE 762 (H) 12/21/2022 0305       Studies/Results: DG Swallowing Func-Speech Pathology  Result Date: 01/12/2023 Table formatting from the original result was not included. Modified Barium Swallow Study Patient Details Name: [name] MRN: [id number] Date of Birth: [birth year] Today's Date: 01/12/2023 HPI/PMH: HPI: Per CCM note "47 year old female with past medical history of recurrent breast cancer not currently on treatment, Crohn's disease s/p subtotal colectomy in 2021 recurrent obstructions, colonic stricture, IDA, recent hospitalization 8/10-8/15/2024 for obstruction seen by GI and general surgery. Underwent EGD and flexible sigmoidoscopy and discharged on steroids who presented to the emergency department on 12/12/2022 with abdominal pain, nausea, vomiting diarrhea. In the ED tachycardic, hypertensive. ED provider notes abdominal distention. She was given 2L IVF, morphine, IV PPI. Labs with WBC 10.8, Hgb 10.7, K 3.2. CT AP was ordered and showed markedly dilated distal small bowel consistent with chronic obstruction. 12/13/2022 patient was found on the floor in the bathroom in ED pulseless. They estimate downtime around 5 minutes. ACLS was initiated. Concern for possible torsades de pointes and given IV mag, defibrillated. Coded for around 5 minutes. She was intubated. She  had copious vomitus in the ETT. ROSC achieved and ICU was consulted. Notably, she had I-stat peri-code which showed hemoglobin of 5.5 down from 10 overnight."  Pt was extubated 12/18/2021.  Pt has been maintained on TPN and is now getting trickle feeds for nutrition. SLP following for dysphagia management.  Pt dislikes puree foods per notes on 9/23 and diet was advanced to mechanical soft on 9/24.  Intake remains compromised as pt reports feeling full due to overnight feeds - therefore tube feeding stopped per surgery.  She is  continuing to have fevers but denies worsening pain - ID on board.  Pt has remained aphonic since intubation 9/4 - and extubation on 9/10. Clinical Impression: Clinical Impression: Pt continues to make progress with swallowing function, demonstrating improvements in lingual control during bolus hold, laryngeal elevation, and epiglottic inversion. This improves her efficiency, as she has complete pharyngeal clearance, as well as her airway protection. With thin liquids in isolation, she has trace, transient penetration (PAS 2), but she did aspiration when trying to swallow the barium tablet with a liquid wash. The thin liquids were aspirated due to mistiming and decreased laryngeal vestibule closure, and aspiration was sensed but not cleared (PAS 7). Note that the barium tablet also remained in the pt's UES, making it difficult for additional thin liquid wash to enter her esophagus and additional sensed aspiration occurred. A bite of puree helpd move the tablet further into her esophagus, and she needed a sip of nectar thick liquids to move it beyond that point. Pt is not sure if she wants to advance her solid diet at this time, and she does report occasional symptoms similar to what was observed with the barium tablet. Therefore will leave on Dys 3 diet but advance to thin liquids, avoiding mixed consistencies and taking small, single sips via cup. Would continue to offer meds in puree, maybe crushing ones that are larger in size. Pt can likely take per her preference given that she was pretty sensate to difficulty on MBS and can verbalize precautions to take if she has trouble clinically. Factors that may increase risk of adverse event in presence of aspiration Rubye Oaks & [name] 2021): Factors that may increase risk of adverse event in presence of aspiration Rubye Oaks & [name] 2021): Poor general health and/or compromised immunity; Respiratory or GI disease; Limited mobility; Weak cough; Presence of tubes (ETT,  trach, NG, etc.) Recommendations/Plan: Swallowing Evaluation Recommendations Swallowing Evaluation Recommendations Recommendations: PO diet PO Diet Recommendation: Dysphagia 3 (Mechanical soft); Thin liquids (Level 0) Liquid Administration via: Cup; No straw Medication Administration: Whole meds with puree (crush larger ones) Supervision: Patient able to self-feed; Intermittent supervision/cueing for swallowing strategies; Set-up assistance for safety Swallowing strategies  : Minimize environmental distractions; Slow rate; Small bites/sips; Avoid mixed consistencies Postural changes: Position pt fully upright for meals Oral care recommendations: Oral care BID (2x/day) Treatment Plan Treatment Plan Treatment recommendations: Therapy as outlined in treatment plan below Follow-up recommendations: Skilled nursing-short term rehab (<3 hours/day) Functional status assessment: Patient has had a recent decline in their functional status and demonstrates the ability to make significant improvements in function in a reasonable and predictable amount of time. Treatment frequency: Min 2x/week Treatment duration: 2 weeks Interventions: Aspiration precaution training; Compensatory techniques; Patient/family education; Trials of upgraded texture/liquids; Diet toleration management by SLP Recommendations Recommendations for follow up therapy are one component of a multi-disciplinary discharge planning process, led by the attending physician.  Recommendations may be updated based on patient status, additional functional criteria  and insurance authorization. Assessment: Orofacial Exam: Orofacial Exam Oral Cavity: Oral Hygiene: WFL Oral Cavity - Dentition: Adequate natural dentition Orofacial Anatomy: WFL Oral Motor/Sensory Function: WFL Anatomy: Anatomy: WFL Boluses Administered: Boluses Administered Boluses Administered: Thin liquids (Level 0); Mildly thick liquids (Level 2, nectar thick); Moderately thick liquids (Level 3, honey  thick); Puree; Solid  Oral Impairment Domain: Oral Impairment Domain Lip Closure: No labial escape Tongue control during bolus hold: Cohesive bolus between tongue to palatal seal Bolus preparation/mastication: -- (although pt perceives this to be effortful) Bolus transport/lingual motion: Brisk tongue motion Oral residue: Trace residue lining oral structures Location of oral residue : Tongue Initiation of pharyngeal swallow : Pyriform sinuses  Pharyngeal Impairment Domain: Pharyngeal Impairment Domain Soft palate elevation: No bolus between soft palate (SP)/pharyngeal wall (PW) Laryngeal elevation: Complete superior movement of thyroid cartilage with complete approximation of arytenoids to epiglottic petiole Anterior hyoid excursion: Partial anterior movement Epiglottic movement: Complete inversion Laryngeal vestibule closure: Incomplete, narrow column air/contrast in laryngeal vestibule Pharyngeal stripping wave : Present - complete Pharyngeal contraction (A/P view only): N/A Pharyngoesophageal segment opening: Partial distention/partial duration, partial obstruction of flow Tongue base retraction: Narrow column of contrast or air between tongue base and PPW Pharyngeal residue: Complete pharyngeal clearance Location of pharyngeal residue: N/A  Esophageal Impairment Domain: Esophageal Impairment Domain Esophageal clearance upright position: Esophageal retention Pill: Pill Consistency administered: Thin liquids (Level 0); Mildly thick liquids (Level 2, nectar thick); Puree Thin liquids (Level 0): Impaired (see clinical impressions) Mildly thick liquids (Level 2, nectar thick): Impaired (see clinical impressions) Puree: Impaired (see clinical impressions) Penetration/Aspiration Scale Score: Penetration/Aspiration Scale Score 1.  Material does not enter airway: Mildly thick liquids (Level 2, nectar thick); Moderately thick liquids (Level 3, honey thick); Puree; Solid; Pill 7.  Material enters airway, passes BELOW cords  and not ejected out despite cough attempt by patient: Thin liquids (Level 0) Compensatory Strategies: No data recorded  General Information: Caregiver present: No  Diet Prior to this Study: Dysphagia 3 (mechanical soft); Mildly thick liquids (Level 2, nectar thick); Cortrak/Small bore NG tube; TPN   Temperature : Normal   Respiratory Status: WFL   Supplemental O2: None (Room air)   History of Recent Intubation: Yes  Behavior/Cognition: Alert; Cooperative; Pleasant mood Self-Feeding Abilities: Able to self-feed Baseline vocal quality/speech: Dysphonic Volitional Cough: Able to elicit Volitional Swallow: Able to elicit Exam Limitations: No limitations Goal Planning: Prognosis for improved oropharyngeal function: Good No data recorded No data recorded Patient/Family Stated Goal: none stated Consulted and agree with results and recommendations: Patient Pain: Pain Assessment Pain Assessment: Faces Faces Pain Scale: 0 Breathing: 0 Negative Vocalization: 0 Facial Expression: 1 Body Language: 1 Consolability: 1 PAINAD Score: 3 Facial Expression: 0 Body Movements: 0 Muscle Tension: 0 Compliance with ventilator (intubated pts.): N/A Vocalization (extubated pts.): N/A CPOT Total: 0 End of Session: Start Time:SLP Start Time (ACUTE ONLY): 1058 Stop Time: SLP Stop Time (ACUTE ONLY): 1120 Time Calculation:SLP Time Calculation (min) (ACUTE ONLY): 22 min Charges: SLP Evaluations $ SLP Speech Visit: 1 Visit SLP Evaluations $MBS Swallow: 1 Procedure $Swallowing Treatment: 1 Procedure SLP visit diagnosis: SLP Visit Diagnosis: Dysphagia, oropharyngeal phase (R13.12) Past Medical History: Past Medical History: Diagnosis Date  Breast cancer (HCC)   right  Colon stricture (HCC) 05/07/2019  Colonic stricture (HCC) 11/19/2022  Crohn's colitis, with intestinal obstruction (HCC) 09/17/2013  Dx 2006 - right and left colon involved 2011 - pan-colitis 09/17/2013 left colitis 60-20 cm March 2021 subtotal colectomy ileosigmoid anastomosis for  colonic strictures-Dr.  Thomas    Crohn's disease [facility])   Exacerbation of Crohn's disease of large intestine (HCC) 12/13/2022  Family history of breast cancer 06/11/2020  History of abdominal subtotal colectomy 11/19/2022  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 10/17/2010  Rectal bleeding   Uterine fibroid   Vitamin D deficiency 09/24/2013 Past Surgical History: Past Surgical History: Procedure Laterality Date  AXILLARY LYMPH NODE DISSECTION Right 11/09/2020  Procedure: RIGHT AXILLARY LYMPH NODE DISSECTION;  Surgeon: Emelia Loron, MD;  Location: [facility];  Service: General;  Laterality: Right;  BIOPSY  07/01/2020  Procedure: BIOPSY;  Surgeon: Benancio Deeds, MD;  Location: [facility];  Service: Gastroenterology;;  BIOPSY  01/27/2021  Procedure: BIOPSY;  Surgeon: Benancio Deeds, MD;  Location: [facility];  Service: Gastroenterology;;  BIOPSY  09/07/2021  Procedure: BIOPSY;  Surgeon: Lynann Bologna, MD;  Location: [facility];  Service: Gastroenterology;;  BIOPSY  11/21/2022  Procedure: BIOPSY;  Surgeon: Lemar Lofty., MD;  Location: [facility];  Service: Gastroenterology;;  BREAST BIOPSY Right 03/24/2022  [place] RT BREAST BX W LOC DEV 1ST LESION IMG BX SPEC US GUIDE 03/24/2022 [facility]  BREAST BIOPSY  04/28/2022  [place] RT RADIOACTIVE SEED LOC 04/28/2022 [facility]  BREAST IMPLANT REMOVAL Right 08/07/2022  Procedure: REMOVAL  OF RIGHT BREAST IMPLANTS;  Surgeon: Allena Napoleon, MD;  Location: [facility];  Service: Plastics;  Laterality: Right;  BREAST RECONSTRUCTION WITH PLACEMENT OF TISSUE EXPANDER AND FLEX HD (ACELLULAR HYDRATED DERMIS) Bilateral 11/09/2020  Procedure: BILATERAL BREAST RECONSTRUCTION WITH PLACEMENT OF TISSUE EXPANDER AND FLEX HD (ACELLULAR HYDRATED DERMIS);  Surgeon: Allena Napoleon, MD;  Location: [facility];  Service: Plastics;  Laterality: Bilateral;  CAPSULOTOMY Bilateral  07/25/2021  Procedure: CAPSULOTOMY;  Surgeon: Allena Napoleon, MD;  Location: [facility];  Service: Plastics;  Laterality: Bilateral;  COLECTOMY  06/27/2019  COLONOSCOPY  2015  ESOPHAGOGASTRODUODENOSCOPY (EGD) WITH PROPOFOL N/A 11/21/2022  Procedure: ESOPHAGOGASTRODUODENOSCOPY (EGD) WITH PROPOFOL;  Surgeon: Lemar Lofty., MD;  [facility];  Service: Gastroenterology;  Laterality: N/A;  FLEXIBLE SIGMOIDOSCOPY N/A 07/01/2020  Procedure: FLEXIBLE SIGMOIDOSCOPY;  Surgeon: Benancio Deeds, MD;  [facility];  Service: Gastroenterology;  Laterality: N/A;  FLEXIBLE SIGMOIDOSCOPY N/A 01/27/2021  Procedure: FLEXIBLE SIGMOIDOSCOPY;  Surgeon: Benancio Deeds, MD;  [facility];  Service: Gastroenterology;  Laterality: N/A;  FLEXIBLE SIGMOIDOSCOPY N/A 09/07/2021  Procedure: FLEXIBLE SIGMOIDOSCOPY;  Surgeon: Lynann Bologna, MD;  Location[facility];  Service: Gastroenterology;  Laterality: N/A;  FLEXIBLE SIGMOIDOSCOPY N/A 11/21/2022  Procedure: FLEXIBLE SIGMOIDOSCOPY;  Surgeon: Meridee Score Netty Starring., MD;  Location: [facility];  Service: Gastroenterology;  Laterality: N/A;  IR FLUORO GUIDE CV LINE RIGHT  01/09/2023  IR US GUIDE VASC ACCESS RIGHT  01/09/2023  LAPAROTOMY N/A 12/14/2022  Procedure: EXPLORATORY LAPAROTOMY WITH SMALL BOWEL RESECTION;  Surgeon: Moise Boring, MD;  Location: [facility];  Service: General;  Laterality: N/A;  LAPAROTOMY N/A 12/16/2022  Procedure: REOPENING LAPAROTOMY, ILEOSTOMY;  ABDOMINAL CLOSURE;  Surgeon: Moise Boring, MD;  Location: [facility];  Service: General;  Laterality: N/A;  NIPPLE SPARING MASTECTOMY Bilateral 11/09/2020  Procedure: BILATERAL NIPPLE SPARING MASTECTOMY;  Surgeon: Emelia Loron, MD;  Location: [facility];  Service: General;  Laterality: Bilateral;  PORT-A-CATH REMOVAL N/A 07/25/2021  Procedure: REMOVAL PORT-A-CATH;  Surgeon: Allena Napoleon, MD;  Location: [facility];  Service:  Plastics;  Laterality: N/A;  PORT-A-CATH REMOVAL Left 01/05/2023  Procedure: REMOVAL PORT-A-CATH;  Surgeon:  Abigail Miyamoto, MD;  Location: [facility];  Service: General;  Laterality: Left;  PORTACATH PLACEMENT N/A 06/23/2020  Procedure: INSERTION PORT-A-CATH;  Surgeon: Emelia Loron, MD;  Location: [facility];  Service: General;  Laterality: N/A;  START TIME OF 11:00 AM FOR 60 MINUTES [place] [facility]  PORTACATH PLACEMENT Left 05/02/2022  Procedure: INSERTION PORT-A-CATH;  Surgeon: Emelia Loron, MD;  Location: [facility];  Service: General;  Laterality: Left;  RADIOACTIVE SEED GUIDED EXCISIONAL BREAST BIOPSY Right 05/02/2022  Procedure: RADIOACTIVE SEED GUIDED RIGHT BREAST MASS EXCISION;  Surgeon: Emelia Loron, MD;  Location: [facility];  Service: General;  Laterality: Right;  REMOVAL OF BILATERAL TISSUE EXPANDERS WITH PLACEMENT OF BILATERAL BREAST IMPLANTS Bilateral 07/25/2021  Procedure: REMOVAL OF BILATERAL TISSUE EXPANDERS WITH PLACEMENT OF BILATERAL BREAST IMPLANTS;  Surgeon: Allena Napoleon, MD;  Location: [facility];  Service: Plastics;  Laterality: Bilateral;  1.5 Mahala Menghini., M.A. CCC-SLP Acute Rehabilitation Services Office [phone] Secure chat preferred 01/12/2023, 11:44 AM  CT GUIDED VISCERAL FLUID DRAIN BY PERC CATH  Result Date: 01/11/2023 INDICATION: 47 year old female with history of Crohn's disease metastatic breast cancer with recent ileostomy creation and development of lower abdominal fluid collection. EXAM: CT IMAGE GUIDED DRAINAGE BY PERCUTANEOUS CATHETER COMPARISON:  CT abdomen pelvis from 01/10/2023 MEDICATIONS: The patient is currently admitted to the hospital and receiving intravenous antibiotics. The antibiotics were administered within an appropriate time frame prior to the initiation of the procedure. ANESTHESIA/SEDATION: Moderate (conscious) sedation was employed during this procedure. A total of Versed 2 mg and  Fentanyl 100 mcg was administered intravenously. Moderate Sedation Time: 14 minutes. The patient's level of consciousness and vital signs were monitored continuously by radiology nursing throughout the procedure under my direct supervision. CONTRAST:  None COMPLICATIONS: None immediate. PROCEDURE: RADIATION DOSE REDUCTION: This exam was performed according to the departmental dose-optimization program which includes automated exposure control, adjustment of the mA and/or kV according to patient size and/or use of iterative reconstruction technique. Informed written consent was obtained from the patient after a discussion of the risks, benefits and alternatives to treatment. The patient was placed supine on the CT gantry and a pre procedural CT was performed re-demonstrating the known abscess/fluid collection within the lower abdomen. The procedure was planned. A timeout was performed prior to the initiation of the procedure. The right lower quadrant was prepped and draped in the usual sterile fashion. The overlying soft tissues were anesthetized with 1% lidocaine with epinephrine. Appropriate trajectory was planned with the use of a 22 gauge spinal needle. An 18 gauge trocar needle was advanced into the abscess/fluid collection and a short Amplatz super stiff wire was coiled within the collection. Appropriate positioning was confirmed with a limited CT scan. The tract was serially dilated allowing placement of a 10 [place] all-purpose drainage catheter. Appropriate positioning was confirmed with a limited postprocedural CT scan. Approximately 100 ml of serosanguineous fluid was aspirated. The tube was connected to a bulb suction and sutured in place. A dressing was placed. The patient tolerated the procedure well without immediate post procedural complication. IMPRESSION: Successful CT guided placement of a 10 French all purpose drain catheter into the lower abdominal fluid collection with aspiration of approximately  100 mL of serosanguineous fluid. Samples were sent to the laboratory as requested by the ordering clinical team. Marliss Coots, MD [facility] Electronically Signed   By: Marliss Coots M.D.   On: 01/11/2023 15:50   CT ABDOMEN PELVIS  W CONTRAST  Result Date: 01/10/2023 CLINICAL DATA:  Infection with multiple drains placed. EXAM: CT ABDOMEN AND PELVIS WITH CONTRAST TECHNIQUE: Multidetector CT imaging of the abdomen and pelvis was performed using the standard protocol following bolus administration of intravenous contrast. RADIATION DOSE REDUCTION: This exam was performed according to the departmental dose-optimization program which includes automated exposure control, adjustment of the mA and/or kV according to patient size and/or use of iterative reconstruction technique. CONTRAST:  80mL OMNIPAQUE IOHEXOL 300 MG/ML  SOLN COMPARISON:  12/31/2022 FINDINGS: Lower chest: Left lower lobe airspace opacity, improving since prior study, likely improving pneumonia. No effusions. Hepatobiliary: No focal hepatic abnormality. Gallbladder unremarkable. Pancreas: No focal abnormality or ductal dilatation. Spleen: No focal abnormality.  Normal size. Adrenals/Urinary Tract: Mild bilateral hydronephrosis, new since prior study. Urinary bladder is moderately distended. No suspicious renal or adrenal lesion. Stomach/Bowel: Feeding tube is in place terminating in the distal duodenum. Dilated loops of proximal small bowel again noted, measuring up to 3.7 cm, stable. Transition to decompressed small bowel near the left lower quadrant ostomy. Prior small bowel resection and partial colectomy. Vascular/Lymphatic: No evidence of aneurysm or adenopathy. Reproductive: Enlarged fibroid uterus again noted. Other: Large fluid collection noted in the mid abdomen extending into the pelvis, measuring 19 x 6 cm, similar to prior study. Locules of gas are noted within the fluid. No drainage  catheters are visualized. Musculoskeletal: No acute bony abnormality. IMPRESSION: Large central fluid collection measuring 19 x 6 cm, similar to prior study. Fluid collection contains locules of gas. Continued dilated proximal small bowel loops with decompressed small bowel near the left lower quadrant ileostomy. Mild bilateral hydronephrosis, new since prior study. Feeding tube tip in the distal duodenum. Electronically Signed   By: Charlett Nose M.D.   On: 01/10/2023 23:19    Anti-infectives: Anti-infectives (From admission, onward)    Start     Dose/Rate Route Frequency Ordered Stop   12/29/22 1215  ciprofloxacin (CIPRO) IVPB 400 mg        400 mg 200 mL/hr over 60 Minutes Intravenous 2 times daily 12/29/22 1123     12/29/22 1215  metroNIDAZOLE (FLAGYL) IVPB 500 mg        500 mg 100 mL/hr over 60 Minutes Intravenous 2 times daily 12/29/22 1123     12/28/22 1430  micafungin (MYCAMINE) 100 mg in sodium chloride 0.9 % 100 mL IVPB  Status:  Discontinued        100 mg 105 mL/hr over 1 Hours Intravenous Every 24 hours 12/28/22 1335 01/05/23 0731   12/23/22 2000  vancomycin (VANCOREADY) IVPB 1250 mg/250 mL  Status:  Discontinued        1,250 mg 166.7 mL/hr over 90 Minutes Intravenous Every 36 hours 12/22/22 0612 12/22/22 1103   12/22/22 0645  vancomycin (VANCOREADY) IVPB 1250 mg/250 mL        1,250 mg 166.7 mL/hr over 90 Minutes Intravenous  Once 12/22/22 0551 12/22/22 0805   12/22/22 0600  piperacillin-tazobactam (ZOSYN) IVPB 3.375 g  Status:  Discontinued        3.375 g 12.5 mL/hr over 240 Minutes Intravenous Every 8 hours 12/22/22 0551 12/29/22 1123   12/13/22 1000  piperacillin-tazobactam (ZOSYN) IVPB 3.375 g  Status:  Discontinued        3.375 g 12.5 mL/hr over 240 Minutes Intravenous Every 8 hours 12/13/22 0833 12/20/22 1333        Assessment/Plan  Hx Crohn's disease s/p subtotal colectomy  Internal hernia with bowel ischemia - POD 27  exploratory laparotomy with small bowel  resection 9/5 Dr. Hillery Hunter - POD 25  s/p exploratory laparotomy, ileostomy, abdominal closure 9/7 Dr. 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 Highly likely to have high ostomy output  - s/p PAC removal 9/27. Pre-alb 11 and Albumin < 1.5 on 9/19 - Imodium 4mg  QID, Iron 300mg  BID, Fibercon 625mg  TID. lomotil 4 mg QID. - DYS 3 diet, nocturnal TF, new CVC placed by IR 10/1 >> resume TPN - Diet per SLP. DYS3 - poor PO intake at current, PO supplements per dietician. - WOCN following for new ostomy. Plan ostomy clinic referral at d/c - VAC removed 10/4, daily WTD - CT A/P 9/16 done. No indication for return to the OR. Low suspicion for bowel ischemia currently. Ascites without signs of infection on para. Lactate 9/25 WNL. Central line removed 9/26. Port removed 9/27. WBC down trending. Fevers resolved. CT AP with large collection - IR drain placed yesterday - no organisms on gram stain, ?seroma - ABx and anti-fungals per ID - PT/OT - Palliative following peripherally currently.      ID - Zosyn 9/4>>9/11. 9/13 >> Micafungin 9/19 >> fevers resolved since port removal.  LE [place] 9/18 neg. UE [place] 9/19 neg. UA neg. CXR w/ L > R opacities with b/l pleural effusions. Resp Cx NGTD. Bcx including specific fungal blood cx NGTD. PAC out 9/27- WBC 26.    FEN - diet per SLP, TPN VTE - SCDs, Hep gtt  Foley - Failed TOV. Foley in place with good UOP.      - per [facility] - PE - Noted on CTA 9/10. LE [place] negative for DVT. Heparin gtt. Hgb 7.7, continue to monitor. Agree with CCM that we may need to consider IVC temporary filter at some point if unable to restart heparin gtt (hx breast ca, recent surgery, immobilized). Shock - off pressors VDRF - extubated  Recent cardiac arrest 9/3 AKI - Cr stable on most recent labs Anemia - Transfuse to keep hemoglobin above 7. Consider IV iron supplementation - she has short gut so worried she will not absorb iron well  orally. Last PT-INR 9/15 was 1.3.  Hx metastatic breast cancer    LOS: 30 days      Juliet Rude, [facility] Surgery 01/12/2023, 11:47 AM Please see Amion for pager number during day hours 7:00am-4:30pm

## 2023-01-12 NOTE — Progress Notes (Signed)
[facility]                                                                               [name], is a 47 y.o. female, DOB - 30-Jun-1975, ZOX:[id number] Admit date - 12/12/2022    Outpatient Primary MD for the patient is Pahwani, Kasandra Knudsen, MD  LOS - 30  days    Brief summary   [name] is a 47 y.o. female with PMH significant for Crohn's disease with previous bowel obstruction s/p subtotal colectomy 2021, colonic stricture, IDA, breast cancer s/p mastectomy 2022 currently not on treatment. Recently hospitalized 8/10-8/15/2024 for bowel obstruction seen by GI and general surgery. Underwent EGD and flexible sigmoidoscopy and discharged on steroids.   9/3, patient presented to the ED with abdominal distention, pain, nausea, vomiting CT abd/pelvis showed markedly dilated distal small bowel consistent with chronic obstruction. No changes since prior study.  Patient was admitted to [facility] with a plan of GI consultation.   9/4, in the morning, within few hours of admission, patient was found pulseless on the floor in the bathroom in ED, estimated downtime about 5 minutes. ACLS was initiated. Concern for possible torsades de pointes and given IV mag, defibrillated.  ROSC achieved in about 5 minutes. She was intubated. She had copious vomitus in the ETT.  Notably, she had I-stat peri-code which showed hemoglobin of 5.5 down from 10 overnight.  Admitted to ICU In the ICU, patient had right femoral CVC placed.  Given 3 minutes of PRBC, 3 units of FFP.  Patient was started on vasopressors   9/5, underwent exploratory laparotomy.  Noted to have internal hernia with associated small bowel ischemia.  Underwent bowel resection. 9/7, back to OR for ostomy placement 9/8, weaned off Levophed 9/10, extubated 9/10, found to have PE in CT angio chest.  Started on heparin drip which was subsequently stopped the next day because of drop in hemoglobin 9/12, mental status improved,  became more interactive.  However failed swallow eval. 9/16, transferred out to [facility] 9/16, CT abdomen showed peritoneal fluid collection 9/17, underwent paracentesis, 500 cc of clear yellow fluid drained 9/22, CT abdomen raised concern of early developing obstruction, peritonitis.  9/23, paracentesis by IR.  Drained 90 cc of turbid, amber/blood-tinged fluid 10/2 CT abd and pelvis showing Large central fluid collection measuring 19 x 6 cm, similar to prior study. Fluid collection contains locules of gas.   Patient's hospitalization has been prolonged because of persistent fever, persistent high output from ostomy, anemia requiring blood transfusion, dysphagia. Patient agreeable to [facility]/ LTACH.   Assessment & Plan    Assessment and Plan:   Internal hernia with bowel ischemia/prior subtotal colectomy for Crohn's disease/short gut syndrome S/p ex lap  with small bowel resection on 9/5 by Dr. Hillery Hunter s/p ex lap, ileostomy, abdominal closure 9/7 Dr. Hillery Hunter Currently only has 100 cm of small bowel left , short gut syndrome TPN dependent.  To slow down ostomy output, patient is currently on Imodium, iron, FiberCon, Imodium.  Surgery following and managing.    Intermittent fever/sepsis ID on board and a repeat CT abdomen pelvis ordered for further evaluation. PICC line removed on 9/26 and  repeat blood cultures have been negative so far. Port-A-Cath was removed on 9/27 and a tunneled central venous catheter placed on 01/09/2023 Patient currently on ciprofloxacin and metronidazole for now.  CT abd and pelvis showing Large central fluid collection measuring 19 x 6 cm, similar to prior study. Fluid collection contains locules of gas. worsening leukocytosis, remains afebrile.  IR consulted and she underwent  drain placement for the large pelvic abscess.  Final recommendations to follow from cultures regarding the antibiotics  Dysphagia Postextubation, patient failed speech therapy  reevaluation multiple amps.  Gradually improving and currently on dysphagia 3 diet, tube feeding during the day and TPN during the night   Persistent sinus tachycardia: ? Deconditioning, vs from PE.  Asymptomatic. Pain sub optimal.  Echo showed LVEF of 60 to 65%, Left ventricle has normal function. Right ventricular systolic function is normal.  On metoprolol 50 mg BID.     Acute pulmonary embolism on the left lower lobe Patient is on IV heparin restarted on 12/30/2022. Monitor.     Anemia of acute illness superimposed on anemia of chronic disease/anemia of from blood loss postop S/p PRBC transfusions. Hemoglobin around 8.3 and stable, transfuse to keep it greater than 7    Hypomagnesemia Replaced   Mild hyponatremia Asymptomatic   Recurrent breast cancer Follow-up with oncology Dr. Pamelia Hoit  Malnutrition Type:  Nutrition Problem: Moderate Malnutrition Etiology: chronic illness (Crohn's disease; metastatic breast cancer)   Malnutrition Characteristics:  Signs/Symptoms: moderate fat depletion, moderate muscle depletion, percent weight loss (24% in 4.5 months) Percent weight loss: 24 % (in 4.5 months)   Nutrition Interventions:  Interventions: Tube feeding, TPN  Estimated body mass index is 19.3 kg/m as calculated from the following:   Height as of this encounter: 5\' 4"  (1.626 m).   Weight as of this encounter: 51 kg.  Code Status: full code.  DVT Prophylaxis:  Place and maintain sequential compression device Start: 12/15/22 1031 SCDs Start: 12/13/22 0413   Level of Care: Level of care: Progressive Family Communication: none at bedside.   Disposition Plan:     Remains inpatient appropriate:  TPN  Procedures:  S/p ex lap  with small bowel resection on 9/5 by Dr. Hillery Hunter s/p ex lap, ileostomy, abdominal closure 9/7 Dr. Marcos Eke venous tunneled catheter on 10/1  Consultants:   Gen surgery ID.  IR  Antimicrobials:   Anti-infectives (From  admission, onward)    Start     Dose/Rate Route Frequency Ordered Stop   12/29/22 1215  ciprofloxacin (CIPRO) IVPB 400 mg        400 mg 200 mL/hr over 60 Minutes Intravenous 2 times daily 12/29/22 1123     12/29/22 1215  metroNIDAZOLE (FLAGYL) IVPB 500 mg        500 mg 100 mL/hr over 60 Minutes Intravenous 2 times daily 12/29/22 1123     12/28/22 1430  micafungin (MYCAMINE) 100 mg in sodium chloride 0.9 % 100 mL IVPB  Status:  Discontinued        100 mg 105 mL/hr over 1 Hours Intravenous Every 24 hours 12/28/22 1335 01/05/23 0731   12/23/22 2000  vancomycin (VANCOREADY) IVPB 1250 mg/250 mL  Status:  Discontinued        1,250 mg 166.7 mL/hr over 90 Minutes Intravenous Every 36 hours 12/22/22 0612 12/22/22 1103   12/22/22 0645  vancomycin (VANCOREADY) IVPB 1250 mg/250 mL        1,250 mg 166.7 mL/hr over 90 Minutes Intravenous  Once 12/22/22 0551 12/22/22 0805  12/22/22 0600  piperacillin-tazobactam (ZOSYN) IVPB 3.375 g  Status:  Discontinued        3.375 g 12.5 mL/hr over 240 Minutes Intravenous Every 8 hours 12/22/22 0551 12/29/22 1123   12/13/22 1000  piperacillin-tazobactam (ZOSYN) IVPB 3.375 g  Status:  Discontinued        3.375 g 12.5 mL/hr over 240 Minutes Intravenous Every 8 hours 12/13/22 0833 12/20/22 1333        Medications  Scheduled Meds:  ALPRAZolam  0.25 mg Oral BID   ascorbic acid  500 mg Oral BID   diphenoxylate-atropine  2 tablet Oral QID   famotidine  20 mg Oral Daily   feeding supplement (KATE FARMS STANDARD 1.4)  325 mL Oral BID BM   feeding supplement (KATE FARMS STANDARD 1.4)  360 mL Oral Q24H   ferrous sulfate  300 mg Oral BID   fiber  1 packet Oral TID   gabapentin  200 mg Oral Q8H   insulin aspart  0-9 Units Subcutaneous 4 times per day   loperamide HCl  4 mg Oral QID   metoprolol tartrate  50 mg Oral BID   mouth rinse  15 mL Mouth Rinse 4 times per day   sodium chloride flush  10-40 mL Intracatheter Q12H   sodium chloride flush  5 mL  Intracatheter Q8H   Vitamin D (Ergocalciferol)  50,000 Units Oral Q7 days   Continuous Infusions:  ciprofloxacin 400 mg (01/12/23 0935)   heparin 1,800 Units/hr (01/12/23 0700)   metronidazole Stopped (01/11/23 2215)   promethazine (PHENERGAN) injection (IM or IVPB)     PRN Meds:.acetaminophen, albuterol, artificial tears, fentaNYL (SUBLIMAZE) injection, food thickener, labetalol, lip balm, LORazepam, [DISCONTINUED] ondansetron **OR** ondansetron (ZOFRAN) IV, mouth rinse, mouth rinse, mouth rinse, mouth rinse, oxyCODONE, promethazine (PHENERGAN) injection (IM or IVPB), silver nitrate applicators, sodium chloride flush    Subjective:   [name] was seen and examined today.  Patient looks tired. No chest pain or sob.   Objective:   Vitals:   01/11/23 1859 01/11/23 2211 01/12/23 0244 01/12/23 0555  BP: 107/79 105/72 105/65 111/80  Pulse: (!) 131 (!) 133 (!) 133 (!) 129  Resp: 20 16 17 16   Temp: 98.4 F (36.9 C) 98.8 F (37.1 C) 98.8 F (37.1 C) 99 F (37.2 C)  TempSrc: Oral Oral Oral Oral  SpO2: 100% 100% 98% 100%  Weight:      Height:        Intake/Output Summary (Last 24 hours) at 01/12/2023 1012 Last data filed at 01/12/2023 0740 Gross per 24 hour  Intake 2740.94 ml  Output 2687 ml  Net 53.94 ml   Filed Weights   01/09/23 0500 01/10/23 0700 01/11/23 0500  Weight: 54.9 kg 51 kg 51 kg     Exam  General exam: cachetic looking lady, not in distress.  Respiratory system: Clear to auscultation. Respiratory effort normal. Cardiovascular system: S1 & S2 heard, RRR.  Gastrointestinal system: Abdomen is soft, ostomy with significant output.  Central nervous system: Alert and oriented. Extremities: no edema.  Skin: No rashes,  Psychiatry: Mood & affect appropriate.     Data Reviewed:  I have personally reviewed following labs and imaging studies   CBC Lab Results  Component Value Date   WBC 26.5 (H) 01/12/2023   RBC 2.64 (L) 01/12/2023   HGB 7.5 (L)  01/12/2023   HCT 24.1 (L) 01/12/2023   MCV 91.3 01/12/2023   MCH 28.4 01/12/2023   PLT 411 (H) 01/12/2023   MCHC  31.1 01/12/2023   RDW 18.6 (H) 01/12/2023   LYMPHSABS 0.8 12/19/2022   MONOABS 0.3 12/19/2022   EOSABS 0.0 12/19/2022   BASOSABS 0.1 12/19/2022     Last metabolic panel Lab Results  Component Value Date   NA 132 (L) 01/12/2023   K 4.7 01/12/2023   CL 102 01/12/2023   CO2 23 01/12/2023   BUN 30 (H) 01/12/2023   CREATININE 0.72 01/12/2023   GLUCOSE 166 (H) 01/12/2023   GFRNONAA >60 01/12/2023   GFRAA 108 01/21/2020   CALCIUM 8.3 (L) 01/12/2023   PHOS 2.5 01/12/2023   PROT 6.7 01/12/2023   ALBUMIN 2.1 (L) 01/12/2023   BILITOT 0.4 01/12/2023   ALKPHOS 113 01/12/2023   AST 28 01/12/2023   ALT 21 01/12/2023   ANIONGAP 7 01/12/2023    CBG (last 3)  Recent Labs    01/12/23 0004 01/12/23 0435 01/12/23 0725  GLUCAP 144* 181* 98      Coagulation Profile: Recent Labs  Lab 01/11/23 1143  INR 1.2     Radiology Studies: CT GUIDED VISCERAL FLUID DRAIN BY PERC CATH  Result Date: 01/11/2023 INDICATION: 47 year old female with history of Crohn's disease metastatic breast cancer with recent ileostomy creation and development of lower abdominal fluid collection. EXAM: CT IMAGE GUIDED DRAINAGE BY PERCUTANEOUS CATHETER COMPARISON:  CT abdomen pelvis from 01/10/2023 MEDICATIONS: The patient is currently admitted to the hospital and receiving intravenous antibiotics. The antibiotics were administered within an appropriate time frame prior to the initiation of the procedure. ANESTHESIA/SEDATION: Moderate (conscious) sedation was employed during this procedure. A total of Versed 2 mg and Fentanyl 100 mcg was administered intravenously. Moderate Sedation Time: 14 minutes. The patient's level of consciousness and vital signs were monitored continuously by radiology nursing throughout the procedure under my direct supervision. CONTRAST:  None COMPLICATIONS: None immediate.  PROCEDURE: RADIATION DOSE REDUCTION: This exam was performed according to the departmental dose-optimization program which includes automated exposure control, adjustment of the mA and/or kV according to patient size and/or use of iterative reconstruction technique. Informed written consent was obtained from the patient after a discussion of the risks, benefits and alternatives to treatment. The patient was placed supine on the CT gantry and a pre procedural CT was performed re-demonstrating the known abscess/fluid collection within the lower abdomen. The procedure was planned. A timeout was performed prior to the initiation of the procedure. The right lower quadrant was prepped and draped in the usual sterile fashion. The overlying soft tissues were anesthetized with 1% lidocaine with epinephrine. Appropriate trajectory was planned with the use of a 22 gauge spinal needle. An 18 gauge trocar needle was advanced into the abscess/fluid collection and a short Amplatz super stiff wire was coiled within the collection. Appropriate positioning was confirmed with a limited CT scan. The tract was serially dilated allowing placement of a 10 [place] all-purpose drainage catheter. Appropriate positioning was confirmed with a limited postprocedural CT scan. Approximately 100 ml of serosanguineous fluid was aspirated. The tube was connected to a bulb suction and sutured in place. A dressing was placed. The patient tolerated the procedure well without immediate post procedural complication. IMPRESSION: Successful CT guided placement of a 10 French all purpose drain catheter into the lower abdominal fluid collection with aspiration of approximately 100 mL of serosanguineous fluid. Samples were sent to the laboratory as requested by the ordering clinical team. Marliss Coots, MD [facility] Radiology Electronically Signed   By: Marliss Coots M.D.   On: 01/11/2023  15:50   CT ABDOMEN  PELVIS W CONTRAST  Result Date: 01/10/2023 CLINICAL DATA:  Infection with multiple drains placed. EXAM: CT ABDOMEN AND PELVIS WITH CONTRAST TECHNIQUE: Multidetector CT imaging of the abdomen and pelvis was performed using the standard protocol following bolus administration of intravenous contrast. RADIATION DOSE REDUCTION: This exam was performed according to the departmental dose-optimization program which includes automated exposure control, adjustment of the mA and/or kV according to patient size and/or use of iterative reconstruction technique. CONTRAST:  80mL OMNIPAQUE IOHEXOL 300 MG/ML  SOLN COMPARISON:  12/31/2022 FINDINGS: Lower chest: Left lower lobe airspace opacity, improving since prior study, likely improving pneumonia. No effusions. Hepatobiliary: No focal hepatic abnormality. Gallbladder unremarkable. Pancreas: No focal abnormality or ductal dilatation. Spleen: No focal abnormality.  Normal size. Adrenals/Urinary Tract: Mild bilateral hydronephrosis, new since prior study. Urinary bladder is moderately distended. No suspicious renal or adrenal lesion. Stomach/Bowel: Feeding tube is in place terminating in the distal duodenum. Dilated loops of proximal small bowel again noted, measuring up to 3.7 cm, stable. Transition to decompressed small bowel near the left lower quadrant ostomy. Prior small bowel resection and partial colectomy. Vascular/Lymphatic: No evidence of aneurysm or adenopathy. Reproductive: Enlarged fibroid uterus again noted. Other: Large fluid collection noted in the mid abdomen extending into the pelvis, measuring 19 x 6 cm, similar to prior study. Locules of gas are noted within the fluid. No drainage catheters are visualized. Musculoskeletal: No acute bony abnormality. IMPRESSION: Large central fluid collection measuring 19 x 6 cm, similar to prior study. Fluid collection contains locules of gas. Continued dilated proximal small bowel loops with decompressed small bowel near the  left lower quadrant ileostomy. Mild bilateral hydronephrosis, new since prior study. Feeding tube tip in the distal duodenum. Electronically Signed   By: Charlett Nose M.D.   On: 01/10/2023 23:19       Kathlen Mody M.D. [facility] 01/12/2023, 10:12 AM  Available via Epic secure chat 7am-7pm After 7 pm, please refer to night coverage provider listed on amion.

## 2023-01-12 NOTE — TOC Progression Note (Addendum)
Transition of Care [facility]) - Progression Note    Patient Details  Name: [name] MRN: [id number] Date of Birth: 20-Sep-1975  Transition of Care [facility]) CM/SW Contact  Howell Rucks, RN Phone Number: 01/12/2023, 1:02 PM  Clinical Narrative:  [facility] rep-DJ, accepted for LTAC, reports LTACH approved by [place]. TOC will continue to follow.   -2:20pm DC order to [facility] today, pt and dtr upset regarding dc, requested to speak with attending, attending spoke with pt/dtr at bedside, dc on hold. [facility], rep-DJ notified.     Expected Discharge Plan: Home/Self Care (unsure at this time) Barriers to Discharge: Continued Medical Work up  Expected Discharge Plan and Services In-house Referral: Hospice / Palliative Care Discharge Planning Services: CM Consult Post Acute Care Choice: NA Living arrangements for the past 2 months: Single Family Home                 DME Arranged: N/A DME Agency: NA       HH Arranged: NA HH Agency: NA         Social Determinants of Health (SDOH) Interventions SDOH Screenings   Food Insecurity: No Food Insecurity (11/21/2022)  Housing: Patient Unable To Answer (11/21/2022)  Transportation Needs: No Transportation Needs (11/21/2022)  Utilities: Not At Risk (11/21/2022)  Tobacco Use: Medium Risk (01/05/2023)    Readmission Risk Interventions    12/20/2022    3:50 PM 12/15/2022    2:43 PM  Readmission Risk Prevention Plan  Transportation Screening Complete Complete  PCP or Specialist Appt within 3-5 Days  Complete  HRI or Home Care Consult  Complete  Social Work Consult for Recovery Care Planning/Counseling  Complete  Palliative Care Screening  Complete  Medication Review [employer]) Complete Complete  PCP or Specialist appointment within 3-5 days of discharge Complete   HRI or Home Care Consult Complete   SW Recovery Care/Counseling Consult Complete   Palliative Care Screening Complete   Skilled Nursing Facility Not Applicable

## 2023-01-12 NOTE — Progress Notes (Signed)
Speech Language Pathology Treatment: Dysphagia  Patient Details Name: [name] MRN: [id number] DOB: Aug 29, 1975 Today's Date: 01/12/2023 Time: 6295-2841 SLP Time Calculation (min) (ACUTE ONLY): 11 min  Assessment / Plan / Recommendation Clinical Impression  Pt was seen this morning with phonation seemingly improved compared to previous SLP documentation. Although still dysphonic, she is producing consistent phonation as opposed to being primarily aphonic. Pt agrees her voice is improving. She is reporting that she is feeling full and does not want to take POs at the moment, but is agreeable to completing MBS today. This session therefore focused primarily on education with pt verbalizing diet modifications, swallowing strategies, and discussing safety precautions with Mod I. Will leave current diet order in place pending completion of repeat MBS, tentatively scheduled with radiology for later today.    HPI HPI: Per CCM note "47 year old female with past medical history of recurrent breast cancer not currently on treatment, Crohn's disease s/p subtotal colectomy in 2021 recurrent obstructions, colonic stricture, IDA, recent hospitalization 8/10-8/15/2024 for obstruction seen by GI and general surgery. Underwent EGD and flexible sigmoidoscopy and discharged on steroids who presented to the emergency department on 12/12/2022 with abdominal pain, nausea, vomiting diarrhea. In the ED tachycardic, hypertensive. ED provider notes abdominal distention. She was given 2L IVF, morphine, IV PPI. Labs with WBC 10.8, Hgb 10.7, K 3.2. CT AP was ordered and showed markedly dilated distal small bowel consistent with chronic obstruction. 12/13/2022 patient was found on the floor in the bathroom in ED pulseless. They estimate downtime around 5 minutes. ACLS was initiated. Concern for possible torsades de pointes and given IV mag, defibrillated. Coded for around 5 minutes. She was intubated. She had copious vomitus in the  ETT. ROSC achieved and ICU was consulted. Notably, she had I-stat peri-code which showed hemoglobin of 5.5 down from 10 overnight."  Pt was extubated 12/18/2021.  Pt has been maintained on TPN and is now getting trickle feeds for nutrition. SLP following for dysphagia management.  Pt dislikes puree foods per notes on 9/23 and diet was advanced to mechanical soft on 9/24.  Intake remains compromised as pt reports feeling full due to overnight feeds - therefore tube feeding stopped per surgery.  She is continuing to have fevers but denies worsening pain - ID on board.  Pt has remained aphonic since intubation 9/4 - and extubation on 9/10.      SLP Plan  MBS      Recommendations for follow up therapy are one component of a multi-disciplinary discharge planning process, led by the attending physician.  Recommendations may be updated based on patient status, additional functional criteria and insurance authorization.    Recommendations  Diet recommendations: Dysphagia 3 (mechanical soft);Nectar-thick liquid Liquids provided via: Cup;Teaspoon Medication Administration: Whole meds with puree Supervision: Patient able to self feed Compensations: Slow rate;Small sips/bites Postural Changes and/or Swallow Maneuvers: Seated upright 90 degrees;Upright 30-60 min after meal                  Oral care BID   Intermittent Supervision/Assistance Dysphagia, oropharyngeal phase (R13.12)     MBS     Mahala Menghini., M.A. CCC-SLP Acute Rehabilitation Services Office [phone]  Secure chat preferred   01/12/2023, 9:15 AM

## 2023-01-13 ENCOUNTER — Other Ambulatory Visit: Payer: Self-pay

## 2023-01-13 DIAGNOSIS — Z9049 Acquired absence of other specified parts of digestive tract: Secondary | ICD-10-CM | POA: Diagnosis not present

## 2023-01-13 DIAGNOSIS — R509 Fever, unspecified: Secondary | ICD-10-CM | POA: Diagnosis not present

## 2023-01-13 DIAGNOSIS — K90829 Short bowel syndrome, unspecified: Secondary | ICD-10-CM | POA: Diagnosis not present

## 2023-01-13 DIAGNOSIS — D500 Iron deficiency anemia secondary to blood loss (chronic): Secondary | ICD-10-CM | POA: Diagnosis not present

## 2023-01-13 LAB — CBC
HCT: 24.8 % — ABNORMAL LOW (ref 36.0–46.0)
Hemoglobin: 7.7 g/dL — ABNORMAL LOW (ref 12.0–15.0)
MCH: 28.5 pg (ref 26.0–34.0)
MCHC: 31 g/dL (ref 30.0–36.0)
MCV: 91.9 fL (ref 80.0–100.0)
Platelets: 414 10*3/uL — ABNORMAL HIGH (ref 150–400)
RBC: 2.7 MIL/uL — ABNORMAL LOW (ref 3.87–5.11)
RDW: 18.6 % — ABNORMAL HIGH (ref 11.5–15.5)
WBC: 23.7 10*3/uL — ABNORMAL HIGH (ref 4.0–10.5)
nRBC: 0.2 % (ref 0.0–0.2)

## 2023-01-13 LAB — CULTURE, BLOOD (ROUTINE X 2)
Culture: NO GROWTH
Culture: NO GROWTH
Special Requests: ADEQUATE
Special Requests: ADEQUATE

## 2023-01-13 LAB — BASIC METABOLIC PANEL
Anion gap: 9 (ref 5–15)
BUN: 28 mg/dL — ABNORMAL HIGH (ref 6–20)
CO2: 24 mmol/L (ref 22–32)
Calcium: 8.3 mg/dL — ABNORMAL LOW (ref 8.9–10.3)
Chloride: 98 mmol/L (ref 98–111)
Creatinine, Ser: 0.71 mg/dL (ref 0.44–1.00)
GFR, Estimated: >60 mL/min (ref >60)
Glucose, Bld: 152 mg/dL — ABNORMAL HIGH (ref 70–99)
Potassium: 4.2 mmol/L (ref 3.5–5.1)
Sodium: 131 mmol/L — ABNORMAL LOW (ref 135–145)

## 2023-01-13 LAB — GLUCOSE, CAPILLARY
Glucose-Capillary: 108 mg/dL — ABNORMAL HIGH (ref 70–99)
Glucose-Capillary: 114 mg/dL — ABNORMAL HIGH (ref 70–99)
Glucose-Capillary: 122 mg/dL — ABNORMAL HIGH (ref 70–99)
Glucose-Capillary: 151 mg/dL — ABNORMAL HIGH (ref 70–99)
Glucose-Capillary: 191 mg/dL — ABNORMAL HIGH (ref 70–99)
Glucose-Capillary: 99 mg/dL (ref 70–99)

## 2023-01-13 LAB — HEPARIN LEVEL (UNFRACTIONATED): Heparin Unfractionated: 0.54 [IU]/mL (ref 0.30–0.70)

## 2023-01-13 LAB — MAGNESIUM: Magnesium: 2.1 mg/dL (ref 1.7–2.4)

## 2023-01-13 LAB — PHOSPHORUS: Phosphorus: 3.6 mg/dL (ref 2.5–4.6)

## 2023-01-13 MED ORDER — ENOXAPARIN SODIUM 60 MG/0.6ML IJ SOSY
1.0000 mg/kg | PREFILLED_SYRINGE | Freq: Two times a day (BID) | INTRAMUSCULAR | Status: DC
  Administered 2023-01-13 – 2023-01-16 (×7): 50 mg via SUBCUTANEOUS
  Filled 2023-01-13 (×7): qty 0.6

## 2023-01-13 MED ORDER — OPIUM 10 MG/ML (1%) PO TINC
10.0000 mg | Freq: Four times a day (QID) | ORAL | Status: DC
  Administered 2023-01-13 – 2023-01-16 (×11): 10 mg
  Filled 2023-01-13 (×11): qty 1

## 2023-01-13 MED ORDER — TRAVASOL 10 % IV SOLN
INTRAVENOUS | Status: AC
  Filled 2023-01-13: qty 940.8

## 2023-01-13 MED ORDER — OPIUM 10 MG/ML (1%) PO TINC
10.0000 mg | Freq: Four times a day (QID) | ORAL | Status: DC
  Administered 2023-01-13: 10 mg

## 2023-01-13 NOTE — Progress Notes (Signed)
8 Days Post-Op   Subjective/Chief Complaint: Patient had some nausea and vomiting yesterday Large amount of ileostomy output Cyclic TPN and tube feeds Receiving Imodium 16 mg/ 24 hours Drain 115 serosanguinous fluid  Objective: Vital signs in last 24 hours: Temp:  [97.9 F (36.6 C)-99.2 F (37.3 C)] 98.4 F (36.9 C) (10/05 0844) Pulse Rate:  [104-126] 126 (10/05 0844) Resp:  [14-20] 20 (10/05 0844) BP: (97-113)/(63-76) 99/68 (10/05 0844) SpO2:  [99 %-100 %] 99 % (10/05 0844) Weight:  [49.2 kg] 49.2 kg (10/05 0500) Last BM Date : 01/13/23  Intake/Output from previous day: 10/04 0701 - 10/05 0700 In: 3107.6 [I.V.:1893.6; NG/GT:600; IV Piggyback:609.1] Out: 2090 [Urine:275; Drains:65; Stool:1750] Intake/Output this shift: No intake/output data recorded.  WDWN in NAD Abd - soft, minimal tenderness Wound - well-granulated Ostomy - copious thin output Drain - clear serosanguinous fluid  Lab Results:  Recent Labs    01/12/23 0601 01/13/23 0306  WBC 26.5* 23.7*  HGB 7.5* 7.7*  HCT 24.1* 24.8*  PLT 411* 414*   BMET Recent Labs    01/12/23 0601 01/13/23 0306  NA 132* 131*  K 4.7 4.2  CL 102 98  CO2 23 24  GLUCOSE 166* 152*  BUN 30* 28*  CREATININE 0.72 0.71  CALCIUM 8.3* 8.3*   PT/INR Recent Labs    01/11/23 1143  LABPROT 15.8*  INR 1.2   ABG No results for input(s): "PHART", "HCO3" in the last 72 hours.  Invalid input(s): "PCO2", "PO2"  Studies/Results: DG Swallowing Func-Speech Pathology  Result Date: 01/12/2023 Table formatting from the original result was not included. Modified Barium Swallow Study Patient Details Name: YOMNA HAVEMAN MRN: [id number] Date of Birth: [birth year] Today's Date: 01/12/2023 HPI/PMH: HPI: Per CCM note "47 year old female with past medical history of recurrent breast cancer not currently on treatment, Crohn's disease s/p subtotal colectomy in 2021 recurrent obstructions, colonic stricture, IDA, recent hospitalization  8/10-8/15/2024 for obstruction seen by GI and general surgery. Underwent EGD and flexible sigmoidoscopy and discharged on steroids who presented to the emergency department on 12/12/2022 with abdominal pain, nausea, vomiting diarrhea. In the ED tachycardic, hypertensive. ED provider notes abdominal distention. She was given 2L IVF, morphine, IV PPI. Labs with WBC 10.8, Hgb 10.7, K 3.2. CT AP was ordered and showed markedly dilated distal small bowel consistent with chronic obstruction. 12/13/2022 patient was found on the floor in the bathroom in ED pulseless. They estimate downtime around 5 minutes. ACLS was initiated. Concern for possible torsades de pointes and given IV mag, defibrillated. Coded for around 5 minutes. She was intubated. She had copious vomitus in the ETT. ROSC achieved and ICU was consulted. Notably, she had I-stat peri-code which showed hemoglobin of 5.5 down from 10 overnight."  Pt was extubated 12/18/2021.  Pt has been maintained on TPN and is now getting trickle feeds for nutrition. SLP following for dysphagia management.  Pt dislikes puree foods per notes on 9/23 and diet was advanced to mechanical soft on 9/24.  Intake remains compromised as pt reports feeling full due to overnight feeds - therefore tube feeding stopped per surgery.  She is continuing to have fevers but denies worsening pain - ID on board.  Pt has remained aphonic since intubation 9/4 - and extubation on 9/10. Clinical Impression: Clinical Impression: Pt continues to make progress with swallowing function, demonstrating improvements in lingual control during bolus hold, laryngeal elevation, and epiglottic inversion. This improves her efficiency, as she has complete pharyngeal clearance, as well as her airway protection.  With thin liquids in isolation, she has trace, transient penetration (PAS 2), but she did aspiration when trying to swallow the barium tablet with a liquid wash. The thin liquids were aspirated due to mistiming and  decreased laryngeal vestibule closure, and aspiration was sensed but not cleared (PAS 7). Note that the barium tablet also remained in the pt's UES, making it difficult for additional thin liquid wash to enter her esophagus and additional sensed aspiration occurred. A bite of puree helpd move the tablet further into her esophagus, and she needed a sip of nectar thick liquids to move it beyond that point. Pt is not sure if she wants to advance her solid diet at this time, and she does report occasional symptoms similar to what was observed with the barium tablet. Therefore will leave on Dys 3 diet but advance to thin liquids, avoiding mixed consistencies and taking small, single sips via cup. Would continue to offer meds in puree, maybe crushing ones that are larger in size. Pt can likely take per her preference given that she was pretty sensate to difficulty on MBS and can verbalize precautions to take if she has trouble clinically. Factors that may increase risk of adverse event in presence of aspiration Rubye Oaks & [name] 2021): Factors that may increase risk of adverse event in presence of aspiration Rubye Oaks & [name] 2021): Poor general health and/or compromised immunity; Respiratory or GI disease; Limited mobility; Weak cough; Presence of tubes (ETT, trach, NG, etc.) Recommendations/Plan: Swallowing Evaluation Recommendations Swallowing Evaluation Recommendations Recommendations: PO diet PO Diet Recommendation: Dysphagia 3 (Mechanical soft); Thin liquids (Level 0) Liquid Administration via: Cup; No straw Medication Administration: Whole meds with puree (crush larger ones) Supervision: Patient able to self-feed; Intermittent supervision/cueing for swallowing strategies; Set-up assistance for safety Swallowing strategies  : Minimize environmental distractions; Slow rate; Small bites/sips; Avoid mixed consistencies Postural changes: Position pt fully upright for meals Oral care recommendations: Oral care BID  (2x/day) Treatment Plan Treatment Plan Treatment recommendations: Therapy as outlined in treatment plan below Follow-up recommendations: Skilled nursing-short term rehab (<3 hours/day) Functional status assessment: Patient has had a recent decline in their functional status and demonstrates the ability to make significant improvements in function in a reasonable and predictable amount of time. Treatment frequency: Min 2x/week Treatment duration: 2 weeks Interventions: Aspiration precaution training; Compensatory techniques; Patient/family education; Trials of upgraded texture/liquids; Diet toleration management by SLP Recommendations Recommendations for follow up therapy are one component of a multi-disciplinary discharge planning process, led by the attending physician.  Recommendations may be updated based on patient status, additional functional criteria and insurance authorization. Assessment: Orofacial Exam: Orofacial Exam Oral Cavity: Oral Hygiene: WFL Oral Cavity - Dentition: Adequate natural dentition Orofacial Anatomy: WFL Oral Motor/Sensory Function: WFL Anatomy: Anatomy: WFL Boluses Administered: Boluses Administered Boluses Administered: Thin liquids (Level 0); Mildly thick liquids (Level 2, nectar thick); Moderately thick liquids (Level 3, honey thick); Puree; Solid  Oral Impairment Domain: Oral Impairment Domain Lip Closure: No labial escape Tongue control during bolus hold: Cohesive bolus between tongue to palatal seal Bolus preparation/mastication: -- (although pt perceives this to be effortful) Bolus transport/lingual motion: Brisk tongue motion Oral residue: Trace residue lining oral structures Location of oral residue : Tongue Initiation of pharyngeal swallow : Pyriform sinuses  Pharyngeal Impairment Domain: Pharyngeal Impairment Domain Soft palate elevation: No bolus between soft palate (SP)/pharyngeal wall (PW) Laryngeal elevation: Complete superior movement of thyroid cartilage with complete  approximation of arytenoids to epiglottic petiole Anterior hyoid excursion: Partial anterior movement Epiglottic  movement: Complete inversion Laryngeal vestibule closure: Incomplete, narrow column air/contrast in laryngeal vestibule Pharyngeal stripping wave : Present - complete Pharyngeal contraction (A/P view only): N/A Pharyngoesophageal segment opening: Partial distention/partial duration, partial obstruction of flow Tongue base retraction: Narrow column of contrast or air between tongue base and PPW Pharyngeal residue: Complete pharyngeal clearance Location of pharyngeal residue: N/A  Esophageal Impairment Domain: Esophageal Impairment Domain Esophageal clearance upright position: Esophageal retention Pill: Pill Consistency administered: Thin liquids (Level 0); Mildly thick liquids (Level 2, nectar thick); Puree Thin liquids (Level 0): Impaired (see clinical impressions) Mildly thick liquids (Level 2, nectar thick): Impaired (see clinical impressions) Puree: Impaired (see clinical impressions) Penetration/Aspiration Scale Score: Penetration/Aspiration Scale Score 1.  Material does not enter airway: Mildly thick liquids (Level 2, nectar thick); Moderately thick liquids (Level 3, honey thick); Puree; Solid; Pill 7.  Material enters airway, passes BELOW cords and not ejected out despite cough attempt by patient: Thin liquids (Level 0) Compensatory Strategies: No data recorded  General Information: Caregiver present: No  Diet Prior to this Study: Dysphagia 3 (mechanical soft); Mildly thick liquids (Level 2, nectar thick); Cortrak/Small bore NG tube; TPN   Temperature : Normal   Respiratory Status: WFL   Supplemental O2: None (Room air)   History of Recent Intubation: Yes  Behavior/Cognition: Alert; Cooperative; Pleasant mood Self-Feeding Abilities: Able to self-feed Baseline vocal quality/speech: Dysphonic Volitional Cough: Able to elicit Volitional Swallow: Able to elicit Exam Limitations: No limitations Goal  Planning: Prognosis for improved oropharyngeal function: Good No data recorded No data recorded Patient/Family Stated Goal: none stated Consulted and agree with results and recommendations: Patient Pain: Pain Assessment Pain Assessment: Faces Faces Pain Scale: 0 Breathing: 0 Negative Vocalization: 0 Facial Expression: 1 Body Language: 1 Consolability: 1 PAINAD Score: 3 Facial Expression: 0 Body Movements: 0 Muscle Tension: 0 Compliance with ventilator (intubated pts.): N/A Vocalization (extubated pts.): N/A CPOT Total: 0 End of Session: Start Time:SLP Start Time (ACUTE ONLY): 1058 Stop Time: SLP Stop Time (ACUTE ONLY): 1120 Time Calculation:SLP Time Calculation (min) (ACUTE ONLY): 22 min Charges: SLP Evaluations $ SLP Speech Visit: 1 Visit SLP Evaluations $MBS Swallow: 1 Procedure $Swallowing Treatment: 1 Procedure SLP visit diagnosis: SLP Visit Diagnosis: Dysphagia, oropharyngeal phase (R13.12) Past Medical History: Past Medical History: Diagnosis Date  Breast cancer (HCC)   right  Colon stricture (HCC) 05/07/2019  Colonic stricture (HCC) 11/19/2022  Crohn's colitis, with intestinal obstruction (HCC) 09/17/2013  Dx 2006 - right and left colon involved 2011 - pan-colitis 09/17/2013 left colitis 60-20 cm March 2021 subtotal colectomy ileosigmoid anastomosis for colonic strictures-Dr. Maisie Fus    Crohn's disease [facility]   Exacerbation of Crohn's disease of large intestine (HCC) 12/13/2022  Family history of breast cancer 06/11/2020  History of abdominal subtotal colectomy 11/19/2022  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 10/17/2010  Rectal bleeding   Uterine fibroid   Vitamin D deficiency 09/24/2013 Past Surgical History: Past Surgical History: Procedure Laterality Date  AXILLARY LYMPH NODE DISSECTION Right 11/09/2020  Procedure: RIGHT AXILLARY LYMPH NODE DISSECTION;  Surgeon: Emelia Loron, MD;  Location: [facility];  Service: General;  Laterality: Right;  BIOPSY   07/01/2020  Procedure: BIOPSY;  Surgeon: Benancio Deeds, MD;  Location: [facility];  Service: Gastroenterology;;  BIOPSY  01/27/2021  Procedure: BIOPSY;  Surgeon: Benancio Deeds, MD;  Location: [facility];  Service: Gastroenterology;;  BIOPSY  09/07/2021  Procedure: BIOPSY;  Surgeon: Lynann Bologna, MD;  Location: [facility];  Service: Gastroenterology;;  BIOPSY  11/21/2022  Procedure: BIOPSY;  Surgeon: Lemar Lofty., MD;  Location: [facility];  Service: Gastroenterology;;  BREAST BIOPSY Right 03/24/2022  [place] RT BREAST BX W LOC DEV 1ST LESION IMG BX SPEC US GUIDE 03/24/2022 [facility]  BREAST BIOPSY  04/28/2022  [place] RT RADIOACTIVE SEED LOC 04/28/2022 [facility]  BREAST IMPLANT REMOVAL Right 08/07/2022  Procedure: REMOVAL  OF RIGHT BREAST IMPLANTS;  Surgeon: Allena Napoleon, MD;  Location: [facility];  Service: Plastics;  Laterality: Right;  BREAST RECONSTRUCTION WITH PLACEMENT OF TISSUE EXPANDER AND FLEX HD (ACELLULAR HYDRATED DERMIS) Bilateral 11/09/2020  Procedure: BILATERAL BREAST RECONSTRUCTION WITH PLACEMENT OF TISSUE EXPANDER AND FLEX HD (ACELLULAR HYDRATED DERMIS);  Surgeon: Allena Napoleon, MD;  Location: [facility];  Service: Plastics;  Laterality: Bilateral;  CAPSULOTOMY Bilateral 07/25/2021  Procedure: CAPSULOTOMY;  Surgeon: Allena Napoleon, MD;  Location: [facility];  Service: Plastics;  Laterality: Bilateral;  COLECTOMY  06/27/2019  COLONOSCOPY  2015  ESOPHAGOGASTRODUODENOSCOPY (EGD) WITH PROPOFOL N/A 11/21/2022  Procedure: ESOPHAGOGASTRODUODENOSCOPY (EGD) WITH PROPOFOL;  Surgeon: Lemar Lofty., MD;  Location: [facility];  Service: Gastroenterology;  Laterality: N/A;  FLEXIBLE SIGMOIDOSCOPY N/A 07/01/2020  Procedure: FLEXIBLE SIGMOIDOSCOPY;  Surgeon: Benancio Deeds, MD;  Location: [facility];  Service: Gastroenterology;  Laterality: N/A;  FLEXIBLE SIGMOIDOSCOPY N/A 01/27/2021  Procedure: FLEXIBLE  SIGMOIDOSCOPY;  Surgeon: Benancio Deeds, MD;  Location: [facility];  Service: Gastroenterology;  Laterality: N/A;  FLEXIBLE SIGMOIDOSCOPY N/A 09/07/2021  Procedure: FLEXIBLE SIGMOIDOSCOPY;  Surgeon: Lynann Bologna, MD;  Location: [facility];  Service: Gastroenterology;  Laterality: N/A;  FLEXIBLE SIGMOIDOSCOPY N/A 11/21/2022  Procedure: FLEXIBLE SIGMOIDOSCOPY;  Surgeon: Meridee Score Netty Starring., MD;  Location: [facility];  Service: Gastroenterology;  Laterality: N/A;  IR FLUORO GUIDE CV LINE RIGHT  01/09/2023  IR US GUIDE VASC ACCESS RIGHT  01/09/2023  LAPAROTOMY N/A 12/14/2022  Procedure: EXPLORATORY LAPAROTOMY WITH SMALL BOWEL RESECTION;  Surgeon: Moise Boring, MD;  Location: [facility];  Service: General;  Laterality: N/A;  LAPAROTOMY N/A 12/16/2022  Procedure: REOPENING LAPAROTOMY, ILEOSTOMY;  ABDOMINAL CLOSURE;  Surgeon: Moise Boring, MD;  Location: [facility];  Service: General;  Laterality: N/A;  NIPPLE SPARING MASTECTOMY Bilateral 11/09/2020  Procedure: BILATERAL NIPPLE SPARING MASTECTOMY;  Surgeon: Emelia Loron, MD;  Location: [facility];  Service: General;  Laterality: Bilateral;  PORT-A-CATH REMOVAL N/A 07/25/2021  Procedure: REMOVAL PORT-A-CATH;  Surgeon: Allena Napoleon, MD;  Location: [facility];  Service: Plastics;  Laterality: N/A;  PORT-A-CATH REMOVAL Left 01/05/2023  Procedure: REMOVAL PORT-A-CATH;  Surgeon: Abigail Miyamoto, MD;  Location: [facility];  Service: General;  Laterality: Left;  PORTACATH PLACEMENT N/A 06/23/2020  Procedure: INSERTION PORT-A-CATH;  Surgeon: Emelia Loron, MD;  Location: [facility];  Service: General;  Laterality: N/A;  START TIME OF 11:00 AM FOR 60 MINUTES [place] [facility]  PORTACATH PLACEMENT Left 05/02/2022  Procedure: INSERTION PORT-A-CATH;  Surgeon: Emelia Loron, MD;  Location: [facility];  Service: General;  Laterality: Left;  RADIOACTIVE SEED GUIDED EXCISIONAL BREAST BIOPSY Right 05/02/2022   Procedure: RADIOACTIVE SEED GUIDED RIGHT BREAST MASS EXCISION;  Surgeon: Emelia Loron, MD;  Location:  SUR[facility]ERY CENTER;  Service: General;  Laterality: Right;  REMOVAL OF BILATERAL TISSUE EXPANDERS WITH PLACEMENT OF BILATERAL BREAST IMPLANTS Bilateral 07/25/2021  Procedure: REMOVAL OF BILATERAL TISSUE EXPANDERS WITH PLACEMENT OF BILATERAL BREAST IMPLANTS;  Surgeon: Allena Napoleon, MD;  Location:  SURGERY CENTER;  Service: Plastics;  Laterality: Bilateral;  1.5  Mahala Menghini., M.A. CCC-SLP Acute Rehabilitation Services Office [phone] Secure chat preferred 01/12/2023, 11:44 AM  CT GUIDED VISCERAL FLUID DRAIN BY PERC CATH  Result Date: 01/11/2023 INDICATION: 47 year old female with history of Crohn's disease metastatic breast cancer with recent ileostomy creation and development of lower abdominal fluid collection. EXAM: CT IMAGE GUIDED DRAINAGE BY PERCUTANEOUS CATHETER COMPARISON:  CT abdomen pelvis from 01/10/2023 MEDICATIONS: The patient is currently admitted to the hospital and receiving intravenous antibiotics. The antibiotics were administered within an appropriate time frame prior to the initiation of the procedure. ANESTHESIA/SEDATION: Moderate (conscious) sedation was employed during this procedure. A total of Versed 2 mg and Fentanyl 100 mcg was administered intravenously. Moderate Sedation Time: 14 minutes. The patient's level of consciousness and vital signs were monitored continuously by radiology nursing throughout the procedure under my direct supervision. CONTRAST:  None COMPLICATIONS: None immediate. PROCEDURE: RADIATION DOSE REDUCTION: This exam was performed according to the departmental dose-optimization program which includes automated exposure control, adjustment of the mA and/or kV according to patient size and/or use of iterative reconstruction technique. Informed written consent was obtained from the patient after a discussion of the risks, benefits and alternatives  to treatment. The patient was placed supine on the CT gantry and a pre procedural CT was performed re-demonstrating the known abscess/fluid collection within the lower abdomen. The procedure was planned. A timeout was performed prior to the initiation of the procedure. The right lower quadrant was prepped and draped in the usual sterile fashion. The overlying soft tissues were anesthetized with 1% lidocaine with epinephrine. Appropriate trajectory was planned with the use of a 22 gauge spinal needle. An 18 gauge trocar needle was advanced into the abscess/fluid collection and a short Amplatz super stiff wire was coiled within the collection. Appropriate positioning was confirmed with a limited CT scan. The tract was serially dilated allowing placement of a 10 [place] all-purpose drainage catheter. Appropriate positioning was confirmed with a limited postprocedural CT scan. Approximately 100 ml of serosanguineous fluid was aspirated. The tube was connected to a bulb suction and sutured in place. A dressing was placed. The patient tolerated the procedure well without immediate post procedural complication. IMPRESSION: Successful CT guided placement of a 10 French all purpose drain catheter into the lower abdominal fluid collection with aspiration of approximately 100 mL of serosanguineous fluid. Samples were sent to the laboratory as requested by the ordering clinical team. Marliss Coots, MD [facility] Electronically Signed   By: Marliss Coots M.D.   On: 01/11/2023 15:50    Anti-infectives: Anti-infectives (From admission, onward)    Start     Dose/Rate Route Frequency Ordered Stop   01/12/23 0000  ciprofloxacin (CIPRO) 400 MG/200ML SOLN        400 mg Intravenous 2 times daily 01/12/23 1359     01/12/23 0000  metroNIDAZOLE (FLAGYL) 500 MG/100ML        500 mg Intravenous 2 times daily 01/12/23 1359     12/29/22 1215  ciprofloxacin (CIPRO) IVPB 400 mg         400 mg 200 mL/hr over 60 Minutes Intravenous 2 times daily 12/29/22 1123     12/29/22 1215  metroNIDAZOLE (FLAGYL) IVPB 500 mg        500 mg 100 mL/hr over 60 Minutes Intravenous 2 times daily 12/29/22 1123     12/28/22 1430  micafungin (MYCAMINE) 100 mg in sodium chloride 0.9 % 100 mL IVPB  Status:  Discontinued  100 mg 105 mL/hr over 1 Hours Intravenous Every 24 hours 12/28/22 1335 01/05/23 0731   12/23/22 2000  vancomycin (VANCOREADY) IVPB 1250 mg/250 mL  Status:  Discontinued        1,250 mg 166.7 mL/hr over 90 Minutes Intravenous Every 36 hours 12/22/22 0612 12/22/22 1103   12/22/22 0645  vancomycin (VANCOREADY) IVPB 1250 mg/250 mL        1,250 mg 166.7 mL/hr over 90 Minutes Intravenous  Once 12/22/22 0551 12/22/22 0805   12/22/22 0600  piperacillin-tazobactam (ZOSYN) IVPB 3.375 g  Status:  Discontinued        3.375 g 12.5 mL/hr over 240 Minutes Intravenous Every 8 hours 12/22/22 0551 12/29/22 1123   12/13/22 1000  piperacillin-tazobactam (ZOSYN) IVPB 3.375 g  Status:  Discontinued        3.375 g 12.5 mL/hr over 240 Minutes Intravenous Every 8 hours 12/13/22 6962 12/20/22 1333       Assessment/Plan:  Hx Crohn's disease s/p subtotal colectomy   Internal hernia with bowel ischemia - POD 28 exploratory laparotomy with small bowel resection 9/5 Dr. Hillery Hunter - POD 26  s/p exploratory laparotomy, ileostomy, abdominal closure 9/7 Dr. 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 Highly likely to have high ostomy output   - add Paregoric (tincture of opium) to try to slow gut motility - s/p PAC removal 9/27 for fever of unknown origin. -   Pre-alb 11 and Albumin < 1.5 on 9/19 - Imodium 4mg  QID, Iron 300mg  BID, Fibercon 625mg  TID. lomotil 4 mg QID. - DYS-3 diet, nocturnal TF, new CVC placed by IR 10/1 >> resume TPN - Diet per SLP. DYS-3 - poor PO intake at current, PO supplements per dietician. - WOCN following for  new ostomy. Plan ostomy clinic referral at d/c - VAC removed 10/4, daily WTD - CT A/P 9/16 done. No indication for return to the OR. Low suspicion for bowel ischemia currently. Ascites without signs of infection on para. Lactate 9/25 WNL. Central line removed 9/26. Port removed 9/27. WBC down trending. Fevers resolved. CT AP with large collection - IR drain placed yesterday - no organisms on gram stain, ?seroma - ABx and anti-fungals per ID - PT/OT - Palliative following peripherally currently.      ID - Zosyn 9/4>>9/11. 9/13 >> Micafungin 9/19 >> fevers resolved since port removal.  LE [place] 9/18 neg. UE [place] 9/19 neg. UA neg. CXR w/ L > R opacities with b/l pleural effusions. Resp Cx NGTD. Bcx including specific fungal blood cx NGTD. PAC out 9/27- WBC 26.    FEN - diet per SLP, TPN VTE - SCDs, Hep gtt  Foley - Failed TOV. Foley in place with good UOP.      - per [facility] - PE - Noted on CTA 9/10. LE [place] negative for DVT. Heparin gtt. Hgb 7.7, continue to monitor. Agree with CCM that we may need to consider IVC temporary filter at some point if unable to restart heparin gtt (hx breast ca, recent surgery, immobilized). Shock - off pressors VDRF - extubated  Recent cardiac arrest 9/3 AKI - Cr stable on most recent labs Anemia - Hgb slightly increased today.  Transfuse to keep hemoglobin above 7. Consider IV iron supplementation - she has short gut so worried she will not absorb iron well orally. Last PT-INR 9/15 was 1.3.  Hx metastatic breast cancer    LOS: 31 days    Wynona Luna 01/13/2023

## 2023-01-13 NOTE — Progress Notes (Signed)
Physical Therapy Treatment Patient Details Name: [name] MRN: [id number] DOB: October 20, 1975 Today's Date: 01/13/2023   History of Present Illness Patient is a 47 year old female who presented on 9/3 with abdominal distension and nausea/vomiting. 9/4 patient was found in ED bathroom with no pulse with ROSC in about 5 mins. Patient was intubated at this time. 9/5 patient underwent exploratory laparotomy that revealed internal hernia with associated small bowel ischemia with patient undergoing bowel resection. 9/7 patient returned to OR for ostomy placement. Patient was extubated on 9/10 and also found to have PE. PMH: breast cancer, Chron's disease, IDA, recent hospitalization 8/10 to 8/15 with bowel obstruction and EGD    PT Comments  Pt AxO x 3 pleasant and always willing.  "I want to go home to see my Baby".  Assisted OOB to amb went well.  Pt able to self rise with use of bed rail and stand with use of walker.  General Gait Details: decreased amb distance this session due to amb with a regular RW vs EVA walker.  HR increased to 144.  Recliner following for safety. Returned to room in recliner.   Discussed LTACH with Pt.  "What is that?" Asked pt.  Pt did look up info on the Web but she was hoping to be able to go home.     If plan is discharge home, recommend the following: A lot of help with walking and/or transfers;A lot of help with bathing/dressing/bathroom;Assistance with cooking/housework;Assist for transportation   Can travel by private vehicle     No  Equipment Recommendations  Rolling walker (2 wheels)    Recommendations for Other Services       Precautions / Restrictions Precautions Precautions: Fall Precaution Comments: abdominal precautions, abominal binder, ostomy, nasal tube Restrictions Weight Bearing Restrictions: No Other Position/Activity Restrictions: abdominal precautions, ostomy     Mobility  Bed Mobility Overal bed mobility: Needs Assistance Bed  Mobility: Supine to Sit     Supine to sit: Supervision, Contact guard     General bed mobility comments: pt self able with asisst to multiple line management    Transfers Overall transfer level: Needs assistance Equipment used: Rolling walker (2 wheels) Transfers: Sit to/from Stand Sit to Stand: Contact guard assist           General transfer comment: pt able to rise at [employer]  Assist with VC's on proper hand placement.    Ambulation/Gait Ambulation/Gait assistance: Min assist, +2 physical assistance, +2 safety/equipment Gait Distance (Feet): 28 Feet Assistive device: Rolling walker (2 wheels) Gait Pattern/deviations: Step-through pattern, Decreased stride length, Narrow base of support Gait velocity: decr     General Gait Details: decreased amb distance this session due to amb with a regular RW vs EVA walker.  HR increased to 144.  Recliner following for safety.   Stairs             Wheelchair Mobility     Tilt Bed    Modified Rankin (Stroke Patients Only)       Balance                                            Cognition Arousal: Alert Behavior During Therapy: WFL for tasks assessed/performed Overall Cognitive Status: Within Functional Limits for tasks assessed  General Comments: AxO x 3 pleasant and willing.  Voice is getting stronger.        Exercises      General Comments        Pertinent Vitals/Pain Pain Assessment Pain Assessment: Faces Faces Pain Scale: Hurts a little bit Pain Location: abdomen RLQ near drain site Pain Descriptors / Indicators: Grimacing, Discomfort Pain Intervention(s): Monitored during session    Home Living                          Prior Function            PT Goals (current goals can now be found in the care plan section) Progress towards PT goals: Progressing toward goals    Frequency    Min 1X/week      PT  Plan      Co-evaluation              AM-PAC PT "6 Clicks" Mobility   Outcome Measure  Help needed turning from your back to your side while in a flat bed without using bedrails?: A Little Help needed moving from lying on your back to sitting on the side of a flat bed without using bedrails?: A Little Help needed moving to and from a bed to a chair (including a wheelchair)?: A Little Help needed standing up from a chair using your arms (e.g., wheelchair or bedside chair)?: A Lot Help needed to walk in hospital room?: A Lot Help needed climbing 3-5 steps with a railing? : Total 6 Click Score: 14    End of Session Equipment Utilized During Treatment: Gait belt Activity Tolerance: Patient tolerated treatment well Patient left: in chair;with call bell/phone within reach Nurse Communication: Mobility status PT Visit Diagnosis: Unsteadiness on feet (R26.81);Muscle weakness (generalized) (M62.81);Difficulty in walking, not elsewhere classified (R26.2)     Time: 3295-1884 PT Time Calculation (min) (ACUTE ONLY): 25 min  Charges:    $Gait Training: 8-22 mins $Therapeutic Activity: 8-22 mins PT General Charges $$ ACUTE PT VISIT: 1 Visit                     Felecia Shelling  PTA Acute  Rehabilitation Services Office M-F          [phone]

## 2023-01-13 NOTE — Progress Notes (Signed)
PHARMACY - TOTAL PARENTERAL NUTRITION CONSULT NOTE   Indication: Short bowel syndrome  Patient Measurements: Height: 5\' 4"  (162.6 cm) Weight: 49.2 kg (108 lb 7.5 oz) IBW/kg (Calculated) : 54.7 TPN AdjBW (KG): 57.8 Body mass index is 18.62 kg/m. Usual Weight: 46.3 kg from 8/28  Assessment: 47 y.o. female with PMH of recurrent breast cancer not currently on treatment, Crohn's disease s/p subtotal colectomy in 2021, recurrent obstructions, colonic stricture, recent hospitalization 8/10-8/15/2024 for obstruction who presented on 12/12/2022 with abdominal pain, nausea, vomiting, diarrhea.    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 9/26 Line holiday for persistent fevers, elevated WBC 9/30 Ok to place new PICC, 10/1 line placed and TPN resumed  Glucose / Insulin: no Hx DM -Steroids PTA for Crohn's disease; weaned off as of 9/13 -CBGs 98-151 -SSI: 5 units / 24hrs Electrolytes: Na low at 131, others WNL including CorrCa of 9.8 Renal: WNL, BUN elevated at 28 Hepatic: LFTs WNL. Albumin low, TG ok I/O:  - UOP: 275 mL recorded + 5 unmeasured urine occurrences -Ileostomy 1750  mL; Drain 115 mL GI Imaging:  -CTA 9/10 shows new PE, but also abdominal ascites -CT Abd: Large volume ascites GI Surgeries / Procedures:  -OR 9/5 ex lap, internal hernia w/ small bowel ischemia, SBR & celiotomy; > 105 cm small bowel remaining -OR 9/7 for closure, ostomy creation -9/17: Paracentesis -9/23 paracentesis   Central access: powerline 10/1 Triple lumen CVC R subclavian 9/6, PAC 05/02/22 (REMOVED) TPN start date: 12/17/22 Held 9/26-9/30 for line holiday Resumed 10/1  Nutritional Goals: Cyclic TPN  (volume 1,680 mL) provides 94 g protein and 1680 kcal per day  RD Assessment: Estimated Needs Total Energy Estimated Needs: 1600-1800 Total Protein Estimated Needs: 85-95 grams Total Fluid Estimated Needs: 1.6L/day  Current Nutrition:  Cyclic TPN (18:00-06:00) Diet: DYS 3 with nectar thickened liquids [employer] 1.4 orally BID, each supplement provides 455 kcal and 20 grams protein   Plan: -Continue cyclic TPN over 12 hr (18:00- 06:00) -Electrolytes in TPN: Na inc to 150 mEq/L, K 40 mEq/L, Ca 5 mEq/L, Mg 10 mEq/L, Phos 15 mmol/L. Cl:Ac ratio 1:1 -Add standard MVI and trace elements to TPN -Replacing vit D per dietician recommendation per MD order -Continue sensitive SSI &  CBG checks 4x daily while on cyclic TPN (1-2 hr after completing ramp-up, on TPN, 1 hr after TPN stopped, off TPN) - CMP, Mag, Phos tomorrow AM -Monitor TPN labs on Mon/Thurs  Lynann Beaver PharmD, BCPS [facility] main pharmacy [phone] 01/13/2023 8:26 AM

## 2023-01-13 NOTE — Progress Notes (Signed)
Intake/Output Summary (Last 24 hours) at 01/13/2023 1440 Last data filed at 01/13/2023 1135 Gross per 24 hour  Intake 3156.92 ml  Output 2340 ml  Net 816.92 ml   Filed Weights   01/10/23 0700 01/11/23 0500 01/13/23 0500  Weight: 51 kg 51 kg 49.2 kg     Exam General exam: ill appearing young lady not in distress.  Respiratory system: Clear to auscultation. Respiratory effort normal. Cardiovascular system: S1 & S2 heard, tachycardic, no JVD, no pedal edema.  Gastrointestinal system: Abdomen is soft, generalized tenderness, bs+ostomy in place with significant drainage. Drain with serosanguinous fluid in the suction bulb.  Central nervous system: Alert and oriented. No focal neurological deficits. Extremities:no pedal edema.  Skin: No rashes,  Psychiatry: Mood & affect appropriate.      Data Reviewed:  I have personally reviewed following labs and imaging studies   CBC Lab Results  Component Value Date   WBC 23.7 (H) 01/13/2023   RBC 2.70 (L) 01/13/2023   HGB 7.7 (L) 01/13/2023   HCT 24.8 (L) 01/13/2023   MCV 91.9 01/13/2023   MCH 28.5 01/13/2023   PLT 414 (H) 01/13/2023   MCHC 31.0 01/13/2023   RDW 18.6 (H) 01/13/2023   LYMPHSABS 0.8 12/19/2022   MONOABS 0.3 12/19/2022   EOSABS 0.0 12/19/2022   BASOSABS 0.1 12/19/2022     Last metabolic panel Lab Results  Component Value Date   NA 131 (L) 01/13/2023   K 4.2 01/13/2023   CL 98 01/13/2023   CO2 24 01/13/2023   BUN 28 (H) 01/13/2023   CREATININE 0.71 01/13/2023   GLUCOSE 152 (H) 01/13/2023   GFRNONAA >60 01/13/2023   GFRAA 108 01/21/2020   CALCIUM 8.3 (L) 01/13/2023   PHOS 3.6 01/13/2023   PROT 6.7 01/12/2023   ALBUMIN 2.1 (L) 01/12/2023   BILITOT 0.4 01/12/2023   ALKPHOS 113 01/12/2023   AST 28 01/12/2023   ALT 21 01/12/2023   ANIONGAP 9 01/13/2023    CBG (last 3)  Recent Labs    01/13/23 0421 01/13/23 0744 01/13/23 1147   GLUCAP 151* 114* 108*      Coagulation Profile: Recent Labs  Lab 01/11/23 1143  INR 1.2     Radiology Studies: DG Swallowing Func-Speech Pathology  Result Date: 01/12/2023 Table formatting from the original result was not included. Modified Barium Swallow Study Patient Details Name: [name] MRN: [id number] Date of Birth: 20-Aug-1975 Today's Date: 01/12/2023 HPI/PMH: HPI: Per CCM note "47 year old female with past medical history of recurrent breast cancer not currently on treatment, Crohn's disease s/p subtotal colectomy in 2021 recurrent obstructions, colonic stricture, IDA, recent hospitalization 8/10-8/15/2024 for obstruction seen by GI and general surgery. Underwent EGD and flexible sigmoidoscopy and discharged on steroids who presented to the emergency department on 12/12/2022 with abdominal pain, nausea, vomiting diarrhea. In the ED tachycardic, hypertensive. ED provider notes abdominal distention. She was given 2L IVF, morphine, IV PPI. Labs with WBC 10.8, Hgb 10.7, K 3.2. CT AP was ordered and showed markedly dilated distal small bowel consistent with chronic obstruction. 12/13/2022 patient was found on the floor in the bathroom in ED pulseless. They estimate downtime around 5 minutes. ACLS was initiated. Concern for possible torsades de pointes and given IV mag, defibrillated. Coded for around 5 minutes. She was intubated. She had copious vomitus in the ETT. ROSC achieved and ICU was consulted. Notably, she had I-stat peri-code which showed hemoglobin of 5.5 down from 10 overnight."  Pt was extubated 12/18/2021.  Pt has  Intake/Output Summary (Last 24 hours) at 01/13/2023 1440 Last data filed at 01/13/2023 1135 Gross per 24 hour  Intake 3156.92 ml  Output 2340 ml  Net 816.92 ml   Filed Weights   01/10/23 0700 01/11/23 0500 01/13/23 0500  Weight: 51 kg 51 kg 49.2 kg     Exam General exam: ill appearing young lady not in distress.  Respiratory system: Clear to auscultation. Respiratory effort normal. Cardiovascular system: S1 & S2 heard, tachycardic, no JVD, no pedal edema.  Gastrointestinal system: Abdomen is soft, generalized tenderness, bs+ostomy in place with significant drainage. Drain with serosanguinous fluid in the suction bulb.  Central nervous system: Alert and oriented. No focal neurological deficits. Extremities:no pedal edema.  Skin: No rashes,  Psychiatry: Mood & affect appropriate.      Data Reviewed:  I have personally reviewed following labs and imaging studies   CBC Lab Results  Component Value Date   WBC 23.7 (H) 01/13/2023   RBC 2.70 (L) 01/13/2023   HGB 7.7 (L) 01/13/2023   HCT 24.8 (L) 01/13/2023   MCV 91.9 01/13/2023   MCH 28.5 01/13/2023   PLT 414 (H) 01/13/2023   MCHC 31.0 01/13/2023   RDW 18.6 (H) 01/13/2023   LYMPHSABS 0.8 12/19/2022   MONOABS 0.3 12/19/2022   EOSABS 0.0 12/19/2022   BASOSABS 0.1 12/19/2022     Last metabolic panel Lab Results  Component Value Date   NA 131 (L) 01/13/2023   K 4.2 01/13/2023   CL 98 01/13/2023   CO2 24 01/13/2023   BUN 28 (H) 01/13/2023   CREATININE 0.71 01/13/2023   GLUCOSE 152 (H) 01/13/2023   GFRNONAA >60 01/13/2023   GFRAA 108 01/21/2020   CALCIUM 8.3 (L) 01/13/2023   PHOS 3.6 01/13/2023   PROT 6.7 01/12/2023   ALBUMIN 2.1 (L) 01/12/2023   BILITOT 0.4 01/12/2023   ALKPHOS 113 01/12/2023   AST 28 01/12/2023   ALT 21 01/12/2023   ANIONGAP 9 01/13/2023    CBG (last 3)  Recent Labs    01/13/23 0421 01/13/23 0744 01/13/23 1147   GLUCAP 151* 114* 108*      Coagulation Profile: Recent Labs  Lab 01/11/23 1143  INR 1.2     Radiology Studies: DG Swallowing Func-Speech Pathology  Result Date: 01/12/2023 Table formatting from the original result was not included. Modified Barium Swallow Study Patient Details Name: [name] MRN: [id number] Date of Birth: 20-Aug-1975 Today's Date: 01/12/2023 HPI/PMH: HPI: Per CCM note "47 year old female with past medical history of recurrent breast cancer not currently on treatment, Crohn's disease s/p subtotal colectomy in 2021 recurrent obstructions, colonic stricture, IDA, recent hospitalization 8/10-8/15/2024 for obstruction seen by GI and general surgery. Underwent EGD and flexible sigmoidoscopy and discharged on steroids who presented to the emergency department on 12/12/2022 with abdominal pain, nausea, vomiting diarrhea. In the ED tachycardic, hypertensive. ED provider notes abdominal distention. She was given 2L IVF, morphine, IV PPI. Labs with WBC 10.8, Hgb 10.7, K 3.2. CT AP was ordered and showed markedly dilated distal small bowel consistent with chronic obstruction. 12/13/2022 patient was found on the floor in the bathroom in ED pulseless. They estimate downtime around 5 minutes. ACLS was initiated. Concern for possible torsades de pointes and given IV mag, defibrillated. Coded for around 5 minutes. She was intubated. She had copious vomitus in the ETT. ROSC achieved and ICU was consulted. Notably, she had I-stat peri-code which showed hemoglobin of 5.5 down from 10 overnight."  Pt was extubated 12/18/2021.  Pt has  Intake/Output Summary (Last 24 hours) at 01/13/2023 1440 Last data filed at 01/13/2023 1135 Gross per 24 hour  Intake 3156.92 ml  Output 2340 ml  Net 816.92 ml   Filed Weights   01/10/23 0700 01/11/23 0500 01/13/23 0500  Weight: 51 kg 51 kg 49.2 kg     Exam General exam: ill appearing young lady not in distress.  Respiratory system: Clear to auscultation. Respiratory effort normal. Cardiovascular system: S1 & S2 heard, tachycardic, no JVD, no pedal edema.  Gastrointestinal system: Abdomen is soft, generalized tenderness, bs+ostomy in place with significant drainage. Drain with serosanguinous fluid in the suction bulb.  Central nervous system: Alert and oriented. No focal neurological deficits. Extremities:no pedal edema.  Skin: No rashes,  Psychiatry: Mood & affect appropriate.      Data Reviewed:  I have personally reviewed following labs and imaging studies   CBC Lab Results  Component Value Date   WBC 23.7 (H) 01/13/2023   RBC 2.70 (L) 01/13/2023   HGB 7.7 (L) 01/13/2023   HCT 24.8 (L) 01/13/2023   MCV 91.9 01/13/2023   MCH 28.5 01/13/2023   PLT 414 (H) 01/13/2023   MCHC 31.0 01/13/2023   RDW 18.6 (H) 01/13/2023   LYMPHSABS 0.8 12/19/2022   MONOABS 0.3 12/19/2022   EOSABS 0.0 12/19/2022   BASOSABS 0.1 12/19/2022     Last metabolic panel Lab Results  Component Value Date   NA 131 (L) 01/13/2023   K 4.2 01/13/2023   CL 98 01/13/2023   CO2 24 01/13/2023   BUN 28 (H) 01/13/2023   CREATININE 0.71 01/13/2023   GLUCOSE 152 (H) 01/13/2023   GFRNONAA >60 01/13/2023   GFRAA 108 01/21/2020   CALCIUM 8.3 (L) 01/13/2023   PHOS 3.6 01/13/2023   PROT 6.7 01/12/2023   ALBUMIN 2.1 (L) 01/12/2023   BILITOT 0.4 01/12/2023   ALKPHOS 113 01/12/2023   AST 28 01/12/2023   ALT 21 01/12/2023   ANIONGAP 9 01/13/2023    CBG (last 3)  Recent Labs    01/13/23 0421 01/13/23 0744 01/13/23 1147   GLUCAP 151* 114* 108*      Coagulation Profile: Recent Labs  Lab 01/11/23 1143  INR 1.2     Radiology Studies: DG Swallowing Func-Speech Pathology  Result Date: 01/12/2023 Table formatting from the original result was not included. Modified Barium Swallow Study Patient Details Name: [name] MRN: [id number] Date of Birth: 20-Aug-1975 Today's Date: 01/12/2023 HPI/PMH: HPI: Per CCM note "47 year old female with past medical history of recurrent breast cancer not currently on treatment, Crohn's disease s/p subtotal colectomy in 2021 recurrent obstructions, colonic stricture, IDA, recent hospitalization 8/10-8/15/2024 for obstruction seen by GI and general surgery. Underwent EGD and flexible sigmoidoscopy and discharged on steroids who presented to the emergency department on 12/12/2022 with abdominal pain, nausea, vomiting diarrhea. In the ED tachycardic, hypertensive. ED provider notes abdominal distention. She was given 2L IVF, morphine, IV PPI. Labs with WBC 10.8, Hgb 10.7, K 3.2. CT AP was ordered and showed markedly dilated distal small bowel consistent with chronic obstruction. 12/13/2022 patient was found on the floor in the bathroom in ED pulseless. They estimate downtime around 5 minutes. ACLS was initiated. Concern for possible torsades de pointes and given IV mag, defibrillated. Coded for around 5 minutes. She was intubated. She had copious vomitus in the ETT. ROSC achieved and ICU was consulted. Notably, she had I-stat peri-code which showed hemoglobin of 5.5 down from 10 overnight."  Pt was extubated 12/18/2021.  Pt has  [facility]                                                                               Diann Bangerter, is a 47 y.o. female, DOB - Jun 17, 1975, NFA:[id number] Admit date - 12/12/2022    Outpatient Primary MD for the patient is Pahwani, Kasandra Knudsen, MD  LOS - 31  days    Brief summary   AIJALON DEMURO is a 48 y.o. female with PMH significant for Crohn's disease with previous bowel obstruction s/p subtotal colectomy 2021, colonic stricture, IDA, breast cancer s/p mastectomy 2022 currently not on treatment. Recently hospitalized 8/10-8/15/2024 for bowel obstruction seen by GI and general surgery. Underwent EGD and flexible sigmoidoscopy and discharged on steroids.   9/3, patient presented to the ED with abdominal distention, pain, nausea, vomiting CT abd/pelvis showed markedly dilated distal small bowel consistent with chronic obstruction. No changes since prior study.  Patient was admitted to [facility] with a plan of GI consultation.   9/4, in the morning, within few hours of admission, patient was found pulseless on the floor in the bathroom in ED, estimated downtime about 5 minutes. ACLS was initiated. Concern for possible torsades de pointes and given IV mag, defibrillated.  ROSC achieved in about 5 minutes. She was intubated. She had copious vomitus in the ETT.  Notably, she had I-stat peri-code which showed hemoglobin of 5.5 down from 10 overnight.  Admitted to ICU, underwent 3 units of PRBC and 3 units of FFP. She was started on vasopressors.   9/5, underwent exploratory laparotomy.  Noted to have internal hernia with associated small bowel ischemia.  Underwent bowel resection. 9/7, back to OR for ostomy placement 9/8, weaned off Levophed 9/10, extubated 9/10, found to have PE in CT angio chest.  Started on heparin drip which was subsequently stopped the next day because of drop in hemoglobin 9/12, mental status improved, became more interactive.  However failed swallow  eval. 9/16, transferred out to [facility] 9/16, CT abdomen showed peritoneal fluid collection 9/17, underwent paracentesis, 500 cc of clear yellow fluid drained 9/22, CT abdomen raised concern of early developing obstruction, peritonitis.  9/23, paracentesis by IR.  Drained 90 cc of turbid, amber/blood-tinged fluid 10/2 CT abd and pelvis showing Large central fluid collection measuring 19 x 6 cm, similar to prior study. Fluid collection contains locules of gas. 10/3 perc drain placement with serosanguinous fluid in the suction bulb. .     Patient's hospitalization has been prolonged because of persistent fever, persistent high output from ostomy, anemia requiring blood transfusion, dysphagia. Patient agreeable to [facility]/ [facility].   Assessment & Plan    Assessment and Plan:   Internal hernia with bowel ischemia/prior subtotal colectomy for Crohn's disease/short gut syndrome S/p ex lap  with small bowel resection on 9/5 by Dr. Hillery Hunter s/p ex lap, ileostomy, abdominal closure 9/7 Dr. Hillery Hunter Currently only has 100 cm of small bowel left , short gut syndrome TPN dependent.  To slow down ostomy output, patient is currently on Imodium, iron, FiberCon, Imodium.  She continues to have significant ostomy output. Pain is better controlled.    Surgery following and managing.    Intermittent fever/sepsis ID  Intake/Output Summary (Last 24 hours) at 01/13/2023 1440 Last data filed at 01/13/2023 1135 Gross per 24 hour  Intake 3156.92 ml  Output 2340 ml  Net 816.92 ml   Filed Weights   01/10/23 0700 01/11/23 0500 01/13/23 0500  Weight: 51 kg 51 kg 49.2 kg     Exam General exam: ill appearing young lady not in distress.  Respiratory system: Clear to auscultation. Respiratory effort normal. Cardiovascular system: S1 & S2 heard, tachycardic, no JVD, no pedal edema.  Gastrointestinal system: Abdomen is soft, generalized tenderness, bs+ostomy in place with significant drainage. Drain with serosanguinous fluid in the suction bulb.  Central nervous system: Alert and oriented. No focal neurological deficits. Extremities:no pedal edema.  Skin: No rashes,  Psychiatry: Mood & affect appropriate.      Data Reviewed:  I have personally reviewed following labs and imaging studies   CBC Lab Results  Component Value Date   WBC 23.7 (H) 01/13/2023   RBC 2.70 (L) 01/13/2023   HGB 7.7 (L) 01/13/2023   HCT 24.8 (L) 01/13/2023   MCV 91.9 01/13/2023   MCH 28.5 01/13/2023   PLT 414 (H) 01/13/2023   MCHC 31.0 01/13/2023   RDW 18.6 (H) 01/13/2023   LYMPHSABS 0.8 12/19/2022   MONOABS 0.3 12/19/2022   EOSABS 0.0 12/19/2022   BASOSABS 0.1 12/19/2022     Last metabolic panel Lab Results  Component Value Date   NA 131 (L) 01/13/2023   K 4.2 01/13/2023   CL 98 01/13/2023   CO2 24 01/13/2023   BUN 28 (H) 01/13/2023   CREATININE 0.71 01/13/2023   GLUCOSE 152 (H) 01/13/2023   GFRNONAA >60 01/13/2023   GFRAA 108 01/21/2020   CALCIUM 8.3 (L) 01/13/2023   PHOS 3.6 01/13/2023   PROT 6.7 01/12/2023   ALBUMIN 2.1 (L) 01/12/2023   BILITOT 0.4 01/12/2023   ALKPHOS 113 01/12/2023   AST 28 01/12/2023   ALT 21 01/12/2023   ANIONGAP 9 01/13/2023    CBG (last 3)  Recent Labs    01/13/23 0421 01/13/23 0744 01/13/23 1147   GLUCAP 151* 114* 108*      Coagulation Profile: Recent Labs  Lab 01/11/23 1143  INR 1.2     Radiology Studies: DG Swallowing Func-Speech Pathology  Result Date: 01/12/2023 Table formatting from the original result was not included. Modified Barium Swallow Study Patient Details Name: [name] MRN: [id number] Date of Birth: 20-Aug-1975 Today's Date: 01/12/2023 HPI/PMH: HPI: Per CCM note "47 year old female with past medical history of recurrent breast cancer not currently on treatment, Crohn's disease s/p subtotal colectomy in 2021 recurrent obstructions, colonic stricture, IDA, recent hospitalization 8/10-8/15/2024 for obstruction seen by GI and general surgery. Underwent EGD and flexible sigmoidoscopy and discharged on steroids who presented to the emergency department on 12/12/2022 with abdominal pain, nausea, vomiting diarrhea. In the ED tachycardic, hypertensive. ED provider notes abdominal distention. She was given 2L IVF, morphine, IV PPI. Labs with WBC 10.8, Hgb 10.7, K 3.2. CT AP was ordered and showed markedly dilated distal small bowel consistent with chronic obstruction. 12/13/2022 patient was found on the floor in the bathroom in ED pulseless. They estimate downtime around 5 minutes. ACLS was initiated. Concern for possible torsades de pointes and given IV mag, defibrillated. Coded for around 5 minutes. She was intubated. She had copious vomitus in the ETT. ROSC achieved and ICU was consulted. Notably, she had I-stat peri-code which showed hemoglobin of 5.5 down from 10 overnight."  Pt was extubated 12/18/2021.  Pt has  Intake/Output Summary (Last 24 hours) at 01/13/2023 1440 Last data filed at 01/13/2023 1135 Gross per 24 hour  Intake 3156.92 ml  Output 2340 ml  Net 816.92 ml   Filed Weights   01/10/23 0700 01/11/23 0500 01/13/23 0500  Weight: 51 kg 51 kg 49.2 kg     Exam General exam: ill appearing young lady not in distress.  Respiratory system: Clear to auscultation. Respiratory effort normal. Cardiovascular system: S1 & S2 heard, tachycardic, no JVD, no pedal edema.  Gastrointestinal system: Abdomen is soft, generalized tenderness, bs+ostomy in place with significant drainage. Drain with serosanguinous fluid in the suction bulb.  Central nervous system: Alert and oriented. No focal neurological deficits. Extremities:no pedal edema.  Skin: No rashes,  Psychiatry: Mood & affect appropriate.      Data Reviewed:  I have personally reviewed following labs and imaging studies   CBC Lab Results  Component Value Date   WBC 23.7 (H) 01/13/2023   RBC 2.70 (L) 01/13/2023   HGB 7.7 (L) 01/13/2023   HCT 24.8 (L) 01/13/2023   MCV 91.9 01/13/2023   MCH 28.5 01/13/2023   PLT 414 (H) 01/13/2023   MCHC 31.0 01/13/2023   RDW 18.6 (H) 01/13/2023   LYMPHSABS 0.8 12/19/2022   MONOABS 0.3 12/19/2022   EOSABS 0.0 12/19/2022   BASOSABS 0.1 12/19/2022     Last metabolic panel Lab Results  Component Value Date   NA 131 (L) 01/13/2023   K 4.2 01/13/2023   CL 98 01/13/2023   CO2 24 01/13/2023   BUN 28 (H) 01/13/2023   CREATININE 0.71 01/13/2023   GLUCOSE 152 (H) 01/13/2023   GFRNONAA >60 01/13/2023   GFRAA 108 01/21/2020   CALCIUM 8.3 (L) 01/13/2023   PHOS 3.6 01/13/2023   PROT 6.7 01/12/2023   ALBUMIN 2.1 (L) 01/12/2023   BILITOT 0.4 01/12/2023   ALKPHOS 113 01/12/2023   AST 28 01/12/2023   ALT 21 01/12/2023   ANIONGAP 9 01/13/2023    CBG (last 3)  Recent Labs    01/13/23 0421 01/13/23 0744 01/13/23 1147   GLUCAP 151* 114* 108*      Coagulation Profile: Recent Labs  Lab 01/11/23 1143  INR 1.2     Radiology Studies: DG Swallowing Func-Speech Pathology  Result Date: 01/12/2023 Table formatting from the original result was not included. Modified Barium Swallow Study Patient Details Name: [name] MRN: [id number] Date of Birth: 20-Aug-1975 Today's Date: 01/12/2023 HPI/PMH: HPI: Per CCM note "47 year old female with past medical history of recurrent breast cancer not currently on treatment, Crohn's disease s/p subtotal colectomy in 2021 recurrent obstructions, colonic stricture, IDA, recent hospitalization 8/10-8/15/2024 for obstruction seen by GI and general surgery. Underwent EGD and flexible sigmoidoscopy and discharged on steroids who presented to the emergency department on 12/12/2022 with abdominal pain, nausea, vomiting diarrhea. In the ED tachycardic, hypertensive. ED provider notes abdominal distention. She was given 2L IVF, morphine, IV PPI. Labs with WBC 10.8, Hgb 10.7, K 3.2. CT AP was ordered and showed markedly dilated distal small bowel consistent with chronic obstruction. 12/13/2022 patient was found on the floor in the bathroom in ED pulseless. They estimate downtime around 5 minutes. ACLS was initiated. Concern for possible torsades de pointes and given IV mag, defibrillated. Coded for around 5 minutes. She was intubated. She had copious vomitus in the ETT. ROSC achieved and ICU was consulted. Notably, she had I-stat peri-code which showed hemoglobin of 5.5 down from 10 overnight."  Pt was extubated 12/18/2021.  Pt has  Intake/Output Summary (Last 24 hours) at 01/13/2023 1440 Last data filed at 01/13/2023 1135 Gross per 24 hour  Intake 3156.92 ml  Output 2340 ml  Net 816.92 ml   Filed Weights   01/10/23 0700 01/11/23 0500 01/13/23 0500  Weight: 51 kg 51 kg 49.2 kg     Exam General exam: ill appearing young lady not in distress.  Respiratory system: Clear to auscultation. Respiratory effort normal. Cardiovascular system: S1 & S2 heard, tachycardic, no JVD, no pedal edema.  Gastrointestinal system: Abdomen is soft, generalized tenderness, bs+ostomy in place with significant drainage. Drain with serosanguinous fluid in the suction bulb.  Central nervous system: Alert and oriented. No focal neurological deficits. Extremities:no pedal edema.  Skin: No rashes,  Psychiatry: Mood & affect appropriate.      Data Reviewed:  I have personally reviewed following labs and imaging studies   CBC Lab Results  Component Value Date   WBC 23.7 (H) 01/13/2023   RBC 2.70 (L) 01/13/2023   HGB 7.7 (L) 01/13/2023   HCT 24.8 (L) 01/13/2023   MCV 91.9 01/13/2023   MCH 28.5 01/13/2023   PLT 414 (H) 01/13/2023   MCHC 31.0 01/13/2023   RDW 18.6 (H) 01/13/2023   LYMPHSABS 0.8 12/19/2022   MONOABS 0.3 12/19/2022   EOSABS 0.0 12/19/2022   BASOSABS 0.1 12/19/2022     Last metabolic panel Lab Results  Component Value Date   NA 131 (L) 01/13/2023   K 4.2 01/13/2023   CL 98 01/13/2023   CO2 24 01/13/2023   BUN 28 (H) 01/13/2023   CREATININE 0.71 01/13/2023   GLUCOSE 152 (H) 01/13/2023   GFRNONAA >60 01/13/2023   GFRAA 108 01/21/2020   CALCIUM 8.3 (L) 01/13/2023   PHOS 3.6 01/13/2023   PROT 6.7 01/12/2023   ALBUMIN 2.1 (L) 01/12/2023   BILITOT 0.4 01/12/2023   ALKPHOS 113 01/12/2023   AST 28 01/12/2023   ALT 21 01/12/2023   ANIONGAP 9 01/13/2023    CBG (last 3)  Recent Labs    01/13/23 0421 01/13/23 0744 01/13/23 1147   GLUCAP 151* 114* 108*      Coagulation Profile: Recent Labs  Lab 01/11/23 1143  INR 1.2     Radiology Studies: DG Swallowing Func-Speech Pathology  Result Date: 01/12/2023 Table formatting from the original result was not included. Modified Barium Swallow Study Patient Details Name: [name] MRN: [id number] Date of Birth: 20-Aug-1975 Today's Date: 01/12/2023 HPI/PMH: HPI: Per CCM note "47 year old female with past medical history of recurrent breast cancer not currently on treatment, Crohn's disease s/p subtotal colectomy in 2021 recurrent obstructions, colonic stricture, IDA, recent hospitalization 8/10-8/15/2024 for obstruction seen by GI and general surgery. Underwent EGD and flexible sigmoidoscopy and discharged on steroids who presented to the emergency department on 12/12/2022 with abdominal pain, nausea, vomiting diarrhea. In the ED tachycardic, hypertensive. ED provider notes abdominal distention. She was given 2L IVF, morphine, IV PPI. Labs with WBC 10.8, Hgb 10.7, K 3.2. CT AP was ordered and showed markedly dilated distal small bowel consistent with chronic obstruction. 12/13/2022 patient was found on the floor in the bathroom in ED pulseless. They estimate downtime around 5 minutes. ACLS was initiated. Concern for possible torsades de pointes and given IV mag, defibrillated. Coded for around 5 minutes. She was intubated. She had copious vomitus in the ETT. ROSC achieved and ICU was consulted. Notably, she had I-stat peri-code which showed hemoglobin of 5.5 down from 10 overnight."  Pt was extubated 12/18/2021.  Pt has  [facility]                                                                               Diann Bangerter, is a 47 y.o. female, DOB - Jun 17, 1975, NFA:[id number] Admit date - 12/12/2022    Outpatient Primary MD for the patient is Pahwani, Kasandra Knudsen, MD  LOS - 31  days    Brief summary   AIJALON DEMURO is a 48 y.o. female with PMH significant for Crohn's disease with previous bowel obstruction s/p subtotal colectomy 2021, colonic stricture, IDA, breast cancer s/p mastectomy 2022 currently not on treatment. Recently hospitalized 8/10-8/15/2024 for bowel obstruction seen by GI and general surgery. Underwent EGD and flexible sigmoidoscopy and discharged on steroids.   9/3, patient presented to the ED with abdominal distention, pain, nausea, vomiting CT abd/pelvis showed markedly dilated distal small bowel consistent with chronic obstruction. No changes since prior study.  Patient was admitted to [facility] with a plan of GI consultation.   9/4, in the morning, within few hours of admission, patient was found pulseless on the floor in the bathroom in ED, estimated downtime about 5 minutes. ACLS was initiated. Concern for possible torsades de pointes and given IV mag, defibrillated.  ROSC achieved in about 5 minutes. She was intubated. She had copious vomitus in the ETT.  Notably, she had I-stat peri-code which showed hemoglobin of 5.5 down from 10 overnight.  Admitted to ICU, underwent 3 units of PRBC and 3 units of FFP. She was started on vasopressors.   9/5, underwent exploratory laparotomy.  Noted to have internal hernia with associated small bowel ischemia.  Underwent bowel resection. 9/7, back to OR for ostomy placement 9/8, weaned off Levophed 9/10, extubated 9/10, found to have PE in CT angio chest.  Started on heparin drip which was subsequently stopped the next day because of drop in hemoglobin 9/12, mental status improved, became more interactive.  However failed swallow  eval. 9/16, transferred out to [facility] 9/16, CT abdomen showed peritoneal fluid collection 9/17, underwent paracentesis, 500 cc of clear yellow fluid drained 9/22, CT abdomen raised concern of early developing obstruction, peritonitis.  9/23, paracentesis by IR.  Drained 90 cc of turbid, amber/blood-tinged fluid 10/2 CT abd and pelvis showing Large central fluid collection measuring 19 x 6 cm, similar to prior study. Fluid collection contains locules of gas. 10/3 perc drain placement with serosanguinous fluid in the suction bulb. .     Patient's hospitalization has been prolonged because of persistent fever, persistent high output from ostomy, anemia requiring blood transfusion, dysphagia. Patient agreeable to [facility]/ LTACH.   Assessment & Plan    Assessment and Plan:   Internal hernia with bowel ischemia/prior subtotal colectomy for Crohn's disease/short gut syndrome S/p ex lap  with small bowel resection on 9/5 by Dr. Hillery Hunter s/p ex lap, ileostomy, abdominal closure 9/7 Dr. Hillery Hunter Currently only has 100 cm of small bowel left , short gut syndrome TPN dependent.  To slow down ostomy output, patient is currently on Imodium, iron, FiberCon, Imodium.  She continues to have significant ostomy output. Pain is better controlled.    Surgery following and managing.    Intermittent fever/sepsis ID

## 2023-01-13 NOTE — TOC Progression Note (Signed)
Transition of Care [facility]) - Progression Note    Patient Details  Name: [name] MRN: [id number] Date of Birth: 25-Jan-1976  Transition [facility]) CM/SW Contact  Amada Jupiter, LCSW Phone Number: 01/13/2023, 1:34 PM  Clinical Narrative:     Met with pt today to review discussion she had with TOC CM yesterday regarding LTACH bed offer and insurance authorization from [facility].  Pt is aware and reports she has discussed this with family, however, still is undecided on whether she wants to dc there or not. Stressed to her that we will need a decision as soon as possible. Have alerted MD that pt very uncertain about why LTACH is needed.  TOC will continue to follow.  Have updated [facility] liaison as well.  Expected Discharge Plan: Home/Self Care (unsure at this time) Barriers to Discharge: Continued Medical Work up  Expected Discharge Plan and Services In-house Referral: Hospice / Palliative Care Discharge Planning Services: CM Consult Post Acute Care Choice: NA Living arrangements for the past 2 months: Single Family Home Expected Discharge Date: 01/12/23               DME Arranged: N/A DME Agency: NA       HH Arranged: NA HH Agency: NA         Social Determinants of Health (SDOH) Interventions SDOH Screenings   Food Insecurity: No Food Insecurity (11/21/2022)  Housing: Patient Unable To Answer (11/21/2022)  Transportation Needs: No Transportation Needs (11/21/2022)  Utilities: Not At Risk (11/21/2022)  Tobacco Use: Medium Risk (01/05/2023)    Readmission Risk Interventions    12/20/2022    3:50 PM 12/15/2022    2:43 PM  Readmission Risk Prevention Plan  Transportation Screening Complete Complete  PCP or Specialist Appt within 3-5 Days  Complete  HRI or Home Care Consult  Complete  Social Work Consult for Recovery Care Planning/Counseling  Complete  Palliative Care Screening  Complete  Medication Review [employer]) Complete Complete  PCP or Specialist  appointment within 3-5 days of discharge Complete   HRI or Home Care Consult Complete   SW Recovery Care/Counseling Consult Complete   Palliative Care Screening Complete   Skilled Nursing Facility Not Applicable

## 2023-01-13 NOTE — Progress Notes (Signed)
ANTICOAGULATION CONSULT NOTE  Pharmacy Consult for heparin Indication: pulmonary embolus  Allergies  Allergen Reactions   Nsaids Other (See Comments)    Crohns disease/ IBD   Chlorhexidine Gluconate Itching    Able to use CHG swabs for ports, reaction with direct skin contact   Hydromorphone Other (See Comments)    Confusion/sedation   Lactose Intolerance (Gi) Other (See Comments)    Upset stomach/diarrhea    Patient Measurements: Height: 5\' 4"  (162.6 cm) Weight: 51 kg (112 lb 7 oz) IBW/kg (Calculated) : 54.7 Heparin Dosing Weight: TBW  Vital Signs: Temp: 98.8 F (37.1 C) (10/05 0418) Temp Source: Oral (10/05 0418) BP: 103/70 (10/05 0418) Pulse Rate: 124 (10/05 0418)  Labs: Recent Labs    01/11/23 0357 01/11/23 1143 01/12/23 0601 01/13/23 0306  HGB 7.7*  --  7.5* 7.7*  HCT 25.5*  --  24.1* 24.8*  PLT 425*  --  411* 414*  LABPROT  --  15.8*  --   --   INR  --  1.2  --   --   HEPARINUNFRC 0.55  --  0.34 0.54  CREATININE 0.76  --  0.72 0.71    Estimated Creatinine Clearance: 71.5 mL/min (by C-G formula based on SCr of 0.71 mg/dL).   Medications:  No AC PTA.  Assessment: 47 yo F with a PMH of recurrent breast cancer not currently on treatment, who presented to the ED on 12/12/2022 with abdominal pain, nausea, vomiting diarrhea.   Patient was placed on heparin drip from 9/10-9/12 for new LLL PE. On 9/12 heparin drip was discontinued due to hemoglobin dropping and intermittent bleeding in ostomy.   On 9/21: Pharmacy has been consulted to resume heparin drip after transfusion (Hgb 7, transfused 1 unit PRBC) On 9/27 heparin held for [facility], resumed w/ no bolus at 13:00  01/13/2023 - Heparin level 0.54 - therapeutic with heparin infusion at 1800 units/hr - Hgb = 7.7, low but stable - Plt = 414, elevated - No bleeding or line issues noted     Goal of Therapy:  Heparin level 0.3-0.7 units/ml Monitor platelets by anticoagulation protocol: Yes   Plan:  Continue  heparin infusion at 1800 units/hr  Daily CBC & Heparin level Monitor for signs/symptoms of bleeding Follow up long-term anticoagulation plans     Arley Phenix RPh 01/13/2023, 5:13 AM

## 2023-01-14 DIAGNOSIS — D500 Iron deficiency anemia secondary to blood loss (chronic): Secondary | ICD-10-CM | POA: Diagnosis not present

## 2023-01-14 DIAGNOSIS — K90829 Short bowel syndrome, unspecified: Secondary | ICD-10-CM | POA: Diagnosis not present

## 2023-01-14 DIAGNOSIS — Z9049 Acquired absence of other specified parts of digestive tract: Secondary | ICD-10-CM | POA: Diagnosis not present

## 2023-01-14 DIAGNOSIS — D72829 Elevated white blood cell count, unspecified: Secondary | ICD-10-CM | POA: Diagnosis not present

## 2023-01-14 DIAGNOSIS — D638 Anemia in other chronic diseases classified elsewhere: Secondary | ICD-10-CM

## 2023-01-14 LAB — CBC WITH DIFFERENTIAL/PLATELET
Abs Immature Granulocytes: 0.63 10*3/uL — ABNORMAL HIGH (ref 0.00–0.07)
Basophils Absolute: 0.1 10*3/uL (ref 0.0–0.1)
Basophils Relative: 0 %
Eosinophils Absolute: 0.1 10*3/uL (ref 0.0–0.5)
Eosinophils Relative: 0 %
HCT: 24.6 % — ABNORMAL LOW (ref 36.0–46.0)
Hemoglobin: 7.5 g/dL — ABNORMAL LOW (ref 12.0–15.0)
Immature Granulocytes: 4 %
Lymphocytes Relative: 8 %
Lymphs Abs: 1.4 10*3/uL (ref 0.7–4.0)
MCH: 28.1 pg (ref 26.0–34.0)
MCHC: 30.5 g/dL (ref 30.0–36.0)
MCV: 92.1 fL (ref 80.0–100.0)
Monocytes Absolute: 1.7 10*3/uL — ABNORMAL HIGH (ref 0.1–1.0)
Monocytes Relative: 9 %
Neutro Abs: 14.3 10*3/uL — ABNORMAL HIGH (ref 1.7–7.7)
Neutrophils Relative %: 79 %
Platelets: 414 10*3/uL — ABNORMAL HIGH (ref 150–400)
RBC: 2.67 MIL/uL — ABNORMAL LOW (ref 3.87–5.11)
RDW: 18.9 % — ABNORMAL HIGH (ref 11.5–15.5)
WBC: 18.2 10*3/uL — ABNORMAL HIGH (ref 4.0–10.5)
nRBC: 0.1 % (ref 0.0–0.2)

## 2023-01-14 LAB — GLUCOSE, CAPILLARY
Glucose-Capillary: 108 mg/dL — ABNORMAL HIGH (ref 70–99)
Glucose-Capillary: 119 mg/dL — ABNORMAL HIGH (ref 70–99)
Glucose-Capillary: 137 mg/dL — ABNORMAL HIGH (ref 70–99)
Glucose-Capillary: 140 mg/dL — ABNORMAL HIGH (ref 70–99)
Glucose-Capillary: 162 mg/dL — ABNORMAL HIGH (ref 70–99)
Glucose-Capillary: 98 mg/dL (ref 70–99)

## 2023-01-14 MED ORDER — KATE FARMS STANDARD 1.4 PO LIQD
360.0000 mL | ORAL | Status: DC
  Administered 2023-01-14: 360 mL
  Filled 2023-01-14 (×2): qty 650

## 2023-01-14 MED ORDER — TRAVASOL 10 % IV SOLN
INTRAVENOUS | Status: AC
  Filled 2023-01-14: qty 940.8

## 2023-01-14 NOTE — Plan of Care (Signed)
  Problem: Education: Goal: Ability to describe self-care measures that may prevent or decrease complications (Diabetes Survival Skills Education) will improve Outcome: Progressing Goal: Individualized Educational Video(s) Outcome: Progressing   Problem: Health Behavior/Discharge Planning: Goal: Ability to identify and utilize available resources and services will improve Outcome: Progressing Goal: Ability to manage health-related needs will improve Outcome: Progressing   Problem: Metabolic: Goal: Ability to maintain appropriate glucose levels will improve Outcome: Progressing

## 2023-01-14 NOTE — Progress Notes (Signed)
9 Days Post-Op   Subjective/Chief Complaint: Still with high ileostomy output Has only received two doses of tincture of opium Still on high-dose Imodium Wants to try taking PO's during the day with tube feeds only at night Cyclic TPN - at night  Objective: Vital signs in last 24 hours: Temp:  [98.4 F (36.9 C)-100 F (37.8 C)] 98.4 F (36.9 C) (10/06 0842) Pulse Rate:  [119-148] 119 (10/06 0842) Resp:  [18-22] 20 (10/06 0842) BP: (98-110)/(66-80) 107/80 (10/06 0842) SpO2:  [98 %-100 %] 100 % (10/06 0842) Weight:  [48.8 kg] 48.8 kg (10/06 0500) Last BM Date : 01/14/23  Intake/Output from previous day: 10/05 0701 - 10/06 0700 In: 1572.3 [P.O.:240; I.V.:976.3; NG/GT:60; IV Piggyback:291] Out: 3000 [Urine:250; Emesis/NG output:1010; Drains:40; Stool:1700] Intake/Output this shift: No intake/output data recorded.  WDWN in NAD Abd - soft, minimal tenderness Wound - well-granulated; clean; minimal drainage Ostomy - copious thin output Drain - clear serosanguinous fluid  Lab Results:  Recent Labs    01/13/23 0306 01/14/23 0257  WBC 23.7* 18.2*  HGB 7.7* 7.5*  HCT 24.8* 24.6*  PLT 414* 414*   BMET Recent Labs    01/12/23 0601 01/13/23 0306  NA 132* 131*  K 4.7 4.2  CL 102 98  CO2 23 24  GLUCOSE 166* 152*  BUN 30* 28*  CREATININE 0.72 0.71  CALCIUM 8.3* 8.3*   PT/INR Recent Labs    01/11/23 1143  LABPROT 15.8*  INR 1.2   ABG No results for input(s): "PHART", "HCO3" in the last 72 hours.  Invalid input(s): "PCO2", "PO2"  Studies/Results: DG Swallowing Func-Speech Pathology  Result Date: 01/12/2023 Table formatting from the original result was not included. Modified Barium Swallow Study Patient Details Name: [name] MRN: [id number] Date of Birth: 02-Feb-1976 Today's Date: 01/12/2023 HPI/PMH: HPI: Per CCM note "47 year old female with past medical history of recurrent breast cancer not currently on treatment, Crohn's disease s/p subtotal colectomy in  2021 recurrent obstructions, colonic stricture, IDA, recent hospitalization 8/10-8/15/2024 for obstruction seen by GI and general surgery. Underwent EGD and flexible sigmoidoscopy and discharged on steroids who presented to the emergency department on 12/12/2022 with abdominal pain, nausea, vomiting diarrhea. In the ED tachycardic, hypertensive. ED provider notes abdominal distention. She was given 2L IVF, morphine, IV PPI. Labs with WBC 10.8, Hgb 10.7, K 3.2. CT AP was ordered and showed markedly dilated distal small bowel consistent with chronic obstruction. 12/13/2022 patient was found on the floor in the bathroom in ED pulseless. They estimate downtime around 5 minutes. ACLS was initiated. Concern for possible torsades de pointes and given IV mag, defibrillated. Coded for around 5 minutes. She was intubated. She had copious vomitus in the ETT. ROSC achieved and ICU was consulted. Notably, she had I-stat peri-code which showed hemoglobin of 5.5 down from 10 overnight."  Pt was extubated 12/18/2021.  Pt has been maintained on TPN and is now getting trickle feeds for nutrition. SLP following for dysphagia management.  Pt dislikes puree foods per notes on 9/23 and diet was advanced to mechanical soft on 9/24.  Intake remains compromised as pt reports feeling full due to overnight feeds - therefore tube feeding stopped per surgery.  She is continuing to have fevers but denies worsening pain - ID on board.  Pt has remained aphonic since intubation 9/4 - and extubation on 9/10. Clinical Impression: Clinical Impression: Pt continues to make progress with swallowing function, demonstrating improvements in lingual control during bolus hold, laryngeal elevation, and epiglottic inversion.  This improves her efficiency, as she has complete pharyngeal Courtney, as well as her airway protection. With thin liquids in isolation, she has trace, transient penetration (PAS 2), but she did aspiration when trying to swallow the barium  tablet with a liquid wash. The thin liquids were aspirated due to mistiming and decreased laryngeal vestibule closure, and aspiration was sensed but not cleared (PAS 7). Note that the barium tablet also remained in the pt's UES, making it difficult for additional thin liquid wash to enter her esophagus and additional sensed aspiration occurred. A bite of puree helpd move the tablet further into her esophagus, and she needed a sip of nectar thick liquids to move it beyond that point. Pt is not sure if she wants to advance her solid diet at this time, and she does report occasional symptoms similar to what was observed with the barium tablet. Therefore will leave on Dys 3 diet but advance to thin liquids, avoiding mixed consistencies and taking small, single sips via cup. Would continue to offer meds in puree, maybe crushing ones that are larger in size. Pt can likely take per her preference given that she was pretty sensate to difficulty on MBS and can verbalize precautions to take if she has trouble clinically. Factors that may increase risk of adverse event in presence of aspiration [name] & [name] 2021): Factors that may increase risk of adverse event in presence of aspiration [name] & [name] 2021): Poor general health and/or compromised immunity; Respiratory or GI disease; Limited mobility; Weak cough; Presence of tubes (ETT, trach, NG, etc.) Recommendations/Plan: Swallowing Evaluation Recommendations Swallowing Evaluation Recommendations Recommendations: PO diet PO Diet Recommendation: Dysphagia 3 (Mechanical soft); Thin liquids (Level 0) Liquid Administration via: Cup; No straw Medication Administration: Whole meds with puree (crush larger ones) Supervision: Patient able to self-feed; Intermittent supervision/cueing for swallowing strategies; Set-up assistance for safety Swallowing strategies  : Minimize environmental distractions; Slow rate; Small bites/sips; Avoid mixed consistencies Postural changes:  Position pt fully upright for meals Oral care recommendations: Oral care BID (2x/day) Treatment Plan Treatment Plan Treatment recommendations: Therapy as outlined in treatment plan below Follow-up recommendations: Skilled nursing-short term rehab (<3 hours/day) Functional status assessment: Patient has had a recent decline in their functional status and demonstrates the ability to make significant improvements in function in a reasonable and predictable amount of time. Treatment frequency: Min 2x/week Treatment duration: 2 weeks Interventions: Aspiration precaution training; Compensatory techniques; Patient/family education; Trials of upgraded texture/liquids; Diet toleration management by SLP Recommendations Recommendations for follow up therapy are one component of a multi-disciplinary discharge planning process, led by the attending physician.  Recommendations may be updated based on patient status, additional functional criteria and insurance authorization. Assessment: Orofacial Exam: Orofacial Exam Oral Cavity: Oral Hygiene: WFL Oral Cavity - Dentition: Adequate natural dentition Orofacial Anatomy: WFL Oral Motor/Sensory Function: WFL Anatomy: Anatomy: WFL Boluses Administered: Boluses Administered Boluses Administered: Thin liquids (Level 0); Mildly thick liquids (Level 2, nectar thick); Moderately thick liquids (Level 3, honey thick); Puree; Solid  Oral Impairment Domain: Oral Impairment Domain Lip Closure: No labial escape Tongue control during bolus hold: Cohesive bolus between tongue to palatal seal Bolus preparation/mastication: -- (although pt perceives this to be effortful) Bolus transport/lingual motion: Brisk tongue motion Oral residue: Trace residue lining oral structures Location of oral residue : Tongue Initiation of pharyngeal swallow : Pyriform sinuses  Pharyngeal Impairment Domain: Pharyngeal Impairment Domain Soft palate elevation: No bolus between soft palate (SP)/pharyngeal wall (PW)  Laryngeal elevation: Complete superior movement of thyroid  cartilage with complete approximation of arytenoids to epiglottic petiole Anterior hyoid excursion: Partial anterior movement Epiglottic movement: Complete inversion Laryngeal vestibule closure: Incomplete, narrow column air/contrast in laryngeal vestibule Pharyngeal stripping wave : Present - complete Pharyngeal contraction (A/P view only): N/A Pharyngoesophageal segment opening: Partial distention/partial duration, partial obstruction of flow Tongue base retraction: Narrow column of contrast or air between tongue base and PPW Pharyngeal residue: Complete pharyngeal Courtney Location of pharyngeal residue: N/A  Esophageal Impairment Domain: Esophageal Impairment Domain Esophageal Courtney upright position: Esophageal retention Pill: Pill Consistency administered: Thin liquids (Level 0); Mildly thick liquids (Level 2, nectar thick); Puree Thin liquids (Level 0): Impaired (see clinical impressions) Mildly thick liquids (Level 2, nectar thick): Impaired (see clinical impressions) Puree: Impaired (see clinical impressions) Penetration/Aspiration Scale Score: Penetration/Aspiration Scale Score 1.  Material does not enter airway: Mildly thick liquids (Level 2, nectar thick); Moderately thick liquids (Level 3, honey thick); Puree; Solid; Pill 7.  Material enters airway, passes BELOW cords and not ejected out despite cough attempt by patient: Thin liquids (Level 0) Compensatory Strategies: No data recorded  General Information: Caregiver present: No  Diet Prior to this Study: Dysphagia 3 (mechanical soft); Mildly thick liquids (Level 2, nectar thick); Cortrak/Small bore NG tube; TPN   Temperature : Normal   Respiratory Status: WFL   Supplemental O2: None (Room air)   History of Recent Intubation: Yes  Behavior/Cognition: Alert; Cooperative; Pleasant mood Self-Feeding Abilities: Able to self-feed Baseline vocal quality/speech: Dysphonic Volitional Cough: Able to  elicit Volitional Swallow: Able to elicit Exam Limitations: No limitations Goal Planning: Prognosis for improved oropharyngeal function: Good No data recorded No data recorded Patient/Family Stated Goal: none stated Consulted and agree with results and recommendations: Patient Pain: Pain Assessment Pain Assessment: Faces Faces Pain Scale: 0 Breathing: 0 Negative Vocalization: 0 Facial Expression: 1 Body Language: 1 Consolability: 1 PAINAD Score: 3 Facial Expression: 0 Body Movements: 0 Muscle Tension: 0 Compliance with ventilator (intubated pts.): N/A Vocalization (extubated pts.): N/A CPOT Total: 0 End of Session: Start Time:SLP Start Time (ACUTE ONLY): 1058 Stop Time: SLP Stop Time (ACUTE ONLY): 1120 Time Calculation:SLP Time Calculation (min) (ACUTE ONLY): 22 min Charges: SLP Evaluations $ SLP Speech Visit: 1 Visit SLP Evaluations $MBS Swallow: 1 Procedure $Swallowing Treatment: 1 Procedure SLP visit diagnosis: SLP Visit Diagnosis: Dysphagia, oropharyngeal phase (R13.12) Past Medical History: Past Medical History: Diagnosis Date  Breast cancer (HCC)   right  Colon stricture (HCC) 05/07/2019  Colonic stricture (HCC) 11/19/2022  Crohn's colitis, with intestinal obstruction (HCC) 09/17/2013  Dx 2006 - right and left colon involved 2011 - pan-colitis 09/17/2013 left colitis 60-20 cm March 2021 subtotal colectomy ileosigmoid anastomosis for colonic strictures-Dr. Maisie Fus    Crohn's disease [facility])   Exacerbation of Crohn's disease of large intestine (HCC) 12/13/2022  Family history of breast cancer 06/11/2020  History of abdominal subtotal colectomy 11/19/2022  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 10/17/2010  Rectal bleeding   Uterine fibroid   Vitamin D deficiency 09/24/2013 Past Surgical History: Past Surgical History: Procedure Laterality Date  AXILLARY LYMPH NODE DISSECTION Right 11/09/2020  Procedure: RIGHT AXILLARY LYMPH NODE DISSECTION;  Surgeon: Emelia Loron, MD;   Location: [facility];  Service: General;  Laterality: Right;  BIOPSY  07/01/2020  Procedure: BIOPSY;  Surgeon: Benancio Deeds, MD;  Location: [facility];  Service: Gastroenterology;;  BIOPSY  01/27/2021  Procedure: BIOPSY;  Surgeon: Benancio Deeds, MD;  Location: [facility];  Service: Gastroenterology;;  BIOPSY  09/07/2021  Procedure: BIOPSY;  Surgeon: Lynann Bologna, MD;  Location: [facility];  Service: Gastroenterology;;  BIOPSY  11/21/2022  Procedure: BIOPSY;  Surgeon: Lemar Lofty., MD;  Location: [facility];  Service: Gastroenterology;;  BREAST BIOPSY Right 03/24/2022  [place] RT BREAST BX W LOC DEV 1ST LESION IMG BX SPEC US GUIDE 03/24/2022 [facility]  BREAST BIOPSY  04/28/2022  [place] RT RADIOACTIVE SEED LOC 04/28/2022 [facility]  BREAST IMPLANT REMOVAL Right 08/07/2022  Procedure: REMOVAL  OF RIGHT BREAST IMPLANTS;  Surgeon: Allena Napoleon, MD;  Location: [facility];  Service: Plastics;  Laterality: Right;  BREAST RECONSTRUCTION WITH PLACEMENT OF TISSUE EXPANDER AND FLEX HD (ACELLULAR HYDRATED DERMIS) Bilateral 11/09/2020  Procedure: BILATERAL BREAST RECONSTRUCTION WITH PLACEMENT OF TISSUE EXPANDER AND FLEX HD (ACELLULAR HYDRATED DERMIS);  Surgeon: Allena Napoleon, MD;  Location: [facility];  Service: Plastics;  Laterality: Bilateral;  CAPSULOTOMY Bilateral 07/25/2021  Procedure: CAPSULOTOMY;  Surgeon: Allena Napoleon, MD;  Location: [facility];  Service: Plastics;  Laterality: Bilateral;  COLECTOMY  06/27/2019  COLONOSCOPY  2015  ESOPHAGOGASTRODUODENOSCOPY (EGD) WITH PROPOFOL N/A 11/21/2022  Procedure: ESOPHAGOGASTRODUODENOSCOPY (EGD) WITH PROPOFOL;  Surgeon: Lemar Lofty., MD;  Location: [facility];  Service: Gastroenterology;  Laterality: N/A;  FLEXIBLE SIGMOIDOSCOPY N/A 07/01/2020  Procedure: FLEXIBLE SIGMOIDOSCOPY;  Surgeon: Benancio Deeds, MD;  Location: [facility];  Service: Gastroenterology;   Laterality: N/A;  FLEXIBLE SIGMOIDOSCOPY N/A 01/27/2021  Procedure: FLEXIBLE SIGMOIDOSCOPY;  Surgeon: Benancio Deeds, MD;  Location: [facility];  Service: Gastroenterology;  Laterality: N/A;  FLEXIBLE SIGMOIDOSCOPY N/A 09/07/2021  Procedure: FLEXIBLE SIGMOIDOSCOPY;  Surgeon: Lynann Bologna, MD;  Location: [facility];  Service: Gastroenterology;  Laterality: N/A;  FLEXIBLE SIGMOIDOSCOPY N/A 11/21/2022  Procedure: FLEXIBLE SIGMOIDOSCOPY;  Surgeon: Meridee Score Netty Starring., MD;  Location: [facility];  Service: Gastroenterology;  Laterality: N/A;  IR FLUORO GUIDE CV LINE RIGHT  01/09/2023  IR US GUIDE VASC ACCESS RIGHT  01/09/2023  LAPAROTOMY N/A 12/14/2022  Procedure: EXPLORATORY LAPAROTOMY WITH SMALL BOWEL RESECTION;  Surgeon: Moise Boring, MD;  Location: [facility];  Service: General;  Laterality: N/A;  LAPAROTOMY N/A 12/16/2022  Procedure: REOPENING LAPAROTOMY, ILEOSTOMY;  ABDOMINAL CLOSURE;  Surgeon: Moise Boring, MD;  Location: [facility];  Service: General;  Laterality: N/A;  NIPPLE SPARING MASTECTOMY Bilateral 11/09/2020  Procedure: BILATERAL NIPPLE SPARING MASTECTOMY;  Surgeon: Emelia Loron, MD;  Location: [facility];  Service: General;  Laterality: Bilateral;  PORT-A-CATH REMOVAL N/A 07/25/2021  Procedure: REMOVAL PORT-A-CATH;  Surgeon: Allena Napoleon, MD;  Location: [facility];  Service: Plastics;  Laterality: N/A;  PORT-A-CATH REMOVAL Left 01/05/2023  Procedure: REMOVAL PORT-A-CATH;  Surgeon: Abigail Miyamoto, MD;  Location: [facility];  Service: General;  Laterality: Left;  PORTACATH PLACEMENT N/A 06/23/2020  Procedure: INSERTION PORT-A-CATH;  Surgeon: Emelia Loron, MD;  Location: [facility];  Service: General;  Laterality: N/A;  START TIME OF 11:00 AM FOR 60 MINUTES [place] [facility]  PORTACATH PLACEMENT Left 05/02/2022  Procedure: INSERTION PORT-A-CATH;  Surgeon: Emelia Loron, MD;  Location: [facility];  Service: General;  Laterality:  Left;  RADIOACTIVE SEED GUIDED EXCISIONAL BREAST BIOPSY Right 05/02/2022  Procedure: RADIOACTIVE SEED GUIDED RIGHT BREAST MASS EXCISION;  Surgeon: Emelia Loron, MD;  Location: [facility];  Service: General;  Laterality: Right;  REMOVAL OF BILATERAL TISSUE EXPANDERS WITH PLACEMENT OF BILATERAL BREAST IMPLANTS Bilateral 07/25/2021  Procedure: REMOVAL OF BILATERAL TISSUE EXPANDERS WITH PLACEMENT OF BILATERAL BREAST IMPLANTS;  Surgeon: Allena Napoleon,  MD;  Location: [facility];  Service: Plastics;  Laterality: Bilateral;  1.5 Mahala Menghini., M.A. CCC-SLP Acute Rehabilitation Services Office [phone] Secure chat preferred 01/12/2023, 11:44 AM   Anti-infectives: Anti-infectives (From admission, onward)    Start     Dose/Rate Route Frequency Ordered Stop   01/12/23 0000  ciprofloxacin (CIPRO) 400 MG/200ML SOLN        400 mg Intravenous 2 times daily 01/12/23 1359     01/12/23 0000  metroNIDAZOLE (FLAGYL) 500 MG/100ML        500 mg Intravenous 2 times daily 01/12/23 1359     12/29/22 1215  ciprofloxacin (CIPRO) IVPB 400 mg        400 mg 200 mL/hr over 60 Minutes Intravenous 2 times daily 12/29/22 1123     12/29/22 1215  metroNIDAZOLE (FLAGYL) IVPB 500 mg        500 mg 100 mL/hr over 60 Minutes Intravenous 2 times daily 12/29/22 1123     12/28/22 1430  micafungin (MYCAMINE) 100 mg in sodium chloride 0.9 % 100 mL IVPB  Status:  Discontinued        100 mg 105 mL/hr over 1 Hours Intravenous Every 24 hours 12/28/22 1335 01/05/23 0731   12/23/22 2000  vancomycin (VANCOREADY) IVPB 1250 mg/250 mL  Status:  Discontinued        1,250 mg 166.7 mL/hr over 90 Minutes Intravenous Every 36 hours 12/22/22 0612 12/22/22 1103   12/22/22 0645  vancomycin (VANCOREADY) IVPB 1250 mg/250 mL        1,250 mg 166.7 mL/hr over 90 Minutes Intravenous  Once 12/22/22 0551 12/22/22 0805   12/22/22 0600  piperacillin-tazobactam (ZOSYN) IVPB 3.375 g  Status:  Discontinued        3.375 g 12.5 mL/hr  over 240 Minutes Intravenous Every 8 hours 12/22/22 0551 12/29/22 1123   12/13/22 1000  piperacillin-tazobactam (ZOSYN) IVPB 3.375 g  Status:  Discontinued        3.375 g 12.5 mL/hr over 240 Minutes Intravenous Every 8 hours 12/13/22 4782 12/20/22 1333       Assessment/Plan:  Hx Crohn's disease s/p subtotal colectomy   Internal hernia with bowel ischemia - POD 29 exploratory laparotomy with small bowel resection 9/5 Dr. Hillery Hunter - POD 27  s/p exploratory laparotomy, ileostomy, abdominal closure 9/7 Dr. 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 Highly likely to have high ostomy output              - add Paregoric (tincture of opium) to try to slow gut motility - s/p PAC removal 9/27 for fever of unknown origin. -   Pre-alb 11 and Albumin < 1.5 on 9/19 - Imodium 4mg  QID, Iron 300mg  BID, Fibercon 625mg  TID. lomotil 4 mg QID. Tincture of opium - added 10/5 - DYS-3 diet, nocturnal TF, new CVC placed by IR 10/1 >> resume TPN - Diet per SLP. DYS-3 - poor PO intake at current, PO supplements per dietician. - WOCN following for new ostomy. Plan ostomy clinic referral at d/c - VAC removed 10/4, daily WTD - CT A/P 9/16 done. No indication for return to the OR. Low suspicion for bowel ischemia currently. Ascites without signs of infection on para. Lactate 9/25 WNL. Central line removed 9/26. Port removed 9/27. WBC down trending. Fevers resolved. CT AP with large collection - IR drain placed  - no organisms on gram stain, ?seroma - ABx and anti-fungals per ID - PT/OT - Palliative  following peripherally currently.      ID - Zosyn 9/4>>9/11. 9/13 >> Micafungin 9/19 >> fevers resolved since port removal.  LE [place] 9/18 neg. UE [place] 9/19 neg. UA neg. CXR w/ L > R opacities with b/l pleural effusions. Resp Cx NGTD. Bcx including specific fungal blood cx NGTD. PAC out 9/27- WBC 26.    FEN - diet per SLP, TPN VTE - SCDs, Hep gtt  Foley - Failed  TOV. Foley in place with good UOP.      - per [facility] - PE - Noted on CTA 9/10. LE [place] negative for DVT. Heparin gtt. Hgb 7.7, continue to monitor. Agree with CCM that we may need to consider IVC temporary filter at some point if unable to restart heparin gtt (hx breast ca, recent surgery, immobilized). Shock - off pressors VDRF - extubated  Recent cardiac arrest 9/3 AKI - Cr stable on most recent labs Anemia - Hgb slightly increased today.  Transfuse to keep hemoglobin above 7. Consider IV iron supplementation - she has short gut so worried she will not absorb iron well orally. Last PT-INR 9/15 was 1.3.  Hx metastatic breast cancer    LOS: 32 days    Wynona Luna 01/14/2023

## 2023-01-14 NOTE — Progress Notes (Signed)
Spoke with Dr Corliss Skains with surgical team during bedside rounding regarding feeding supplements given VIA NG tube. Patient would like to attempt to orally drink supplements - Per. Dr Corliss Skains OK to pause continuous NG supplement during the day and allow for patient to orally drink supplements during daytime hours. Will resume continuous supplement infusion via NG tube over 12 hours at night. Per Dr. Corliss Skains - OK for patient to receive TPN and continuous supplement via NG tube at the same time.   Message sent to attending Dr. Blake Divine - agrees with above plan.    RN paused continuous supplements at 0930 and given [employer] feeding supplement for PO intake

## 2023-01-14 NOTE — Progress Notes (Signed)
[facility]                                                                               Chasie Finefrock, is a 47 y.o. female, DOB - 04-06-1976, GEX:[id number] Admit date - 12/12/2022    Outpatient Primary MD for the patient is Pahwani, Kasandra Knudsen, MD  LOS - 32  days    Brief summary   MALAI MURRAH is a 47 y.o. female with PMH significant for Crohn's disease with previous bowel obstruction s/p subtotal colectomy 2021, colonic stricture, IDA, breast cancer s/p mastectomy 2022 currently not on treatment. Recently hospitalized 8/10-8/15/2024 for bowel obstruction seen by GI and general surgery. Underwent EGD and flexible sigmoidoscopy and discharged on steroids.   9/3, patient presented to the ED with abdominal distention, pain, nausea, vomiting CT abd/pelvis showed markedly dilated distal small bowel consistent with chronic obstruction. No changes since prior study.  Patient was admitted to [facility] with a plan of GI consultation.   9/4, in the morning, within few hours of admission, patient was found pulseless on the floor in the bathroom in ED, estimated downtime about 5 minutes. ACLS was initiated. Concern for possible torsades de pointes and given IV mag, defibrillated.  ROSC achieved in about 5 minutes. She was intubated. She had copious vomitus in the ETT.  Notably, she had I-stat peri-code which showed hemoglobin of 5.5 down from 10 overnight.  Admitted to ICU, underwent 3 units of PRBC and 3 units of FFP. She was started on vasopressors.   9/5, underwent exploratory laparotomy.  Noted to have internal hernia with associated small bowel ischemia.  Underwent bowel resection. 9/7, back to OR for ostomy placement 9/8, weaned off Levophed 9/10, extubated 9/10, found to have PE in CT angio chest.  Started on heparin drip which was subsequently stopped the next day because of drop in hemoglobin 9/12, mental status improved, became more interactive.  However failed swallow  eval. 9/16, transferred out to [facility] 9/16, CT abdomen showed peritoneal fluid collection 9/17, underwent paracentesis, 500 cc of clear yellow fluid drained 9/22, CT abdomen raised concern of early developing obstruction, peritonitis.  9/23, paracentesis by IR.  Drained 90 cc of turbid, amber/blood-tinged fluid 10/2 CT abd and pelvis showing Large central fluid collection measuring 19 x 6 cm, similar to prior study. Fluid collection contains locules of gas. 10/3 perc drain placement with serosanguinous fluid in the suction bulb. .     Patient's hospitalization has been prolonged because of persistent fever, persistent high output from ostomy, anemia requiring blood transfusion, dysphagia.   Assessment & Plan    Assessment and Plan:   Internal hernia with bowel ischemia/prior subtotal colectomy for Crohn's disease/short gut syndrome S/p ex lap  with small bowel resection on 9/5 by Dr. Hillery Hunter s/p ex lap, ileostomy, abdominal closure 9/7 Dr. Hillery Hunter Currently only has 100 cm of small bowel left , short gut syndrome TPN dependent.  To slow down ostomy output, patient is currently on Imodium, iron, FiberCon, Imodium.  She continues to have significant ostomy output.   Pain is better controlled.   Surgery following and managing. Patient requesting for nutrition by mouth during the day ,  tube feeds during the night and TPN at night.     Intermittent fever/sepsis PICC line removed on 9/26 and repeat blood cultures have been negative so far. Port-A-Cath was removed on 9/27 and a tunneled central venous catheter placed on 01/09/2023 Patient currently on ciprofloxacin and metronidazole for now.  ID on board , recommendations to follow regarding the antibiotics, currently waiting for final culture report from the large pelvic abscess.  CT abd and pelvis showing Large central fluid collection measuring 19 x 6 cm, similar to prior study. Fluid collection contains locules of gas.  Improving  leukocytosis with drain placement. Wbc count is 18,200.  IR consulted and she underwent  drain placement for the large pelvic abscess.  Repeat imaging/possible drain injection once output < 10 mL/QD (excluding flush material). Consideration for drain removal if output is < 10 mL/QD (excluding flush material), pending discussion with the providing surgical service. Final recommendations to follow from cultures .  Dysphagia Postextubation, patient failed speech therapy reevaluation .  Gradually improving and currently on dysphagia 3 diet, tube feeding during the day and TPN during the night.  SLP following.    Persistent sinus tachycardia: ? Deconditioning, vs from PE.  Asymptomatic. Pain sub optimal.  Echo showed LVEF of 60 to 65%, Left ventricle has normal function. Right ventricular systolic function is normal.  On metoprolol 50 mg BID.  Remains tachycardic due to disease process.     Acute pulmonary embolism on the left lower lobe Patient is on IV heparin restarted on 12/30/2022, transition to lovenox.  Monitor H&h while on anti coagulation.  Hemoglobin still borderline around 7.    Anemia of acute illness superimposed on anemia of chronic disease/anemia of from blood loss postop S/p 3 units of PRBC transfusions. Hemoglobin slowly dropping from 8.3 to 7.5 to 7.7 to 7.5  today.  Transfuse to keep it greater than 7.    Hypomagnesemia Replaced, repeat level wnl.    Mild hyponatremia Asymptomatic   Recurrent breast cancer Follow-up with oncology Dr. Pamelia Hoit  Malnutrition Type:  Nutrition Problem: Moderate Malnutrition Etiology: chronic illness (Crohn's disease; metastatic breast cancer)   Malnutrition Characteristics:  Signs/Symptoms: moderate fat depletion, moderate muscle depletion, percent weight loss (24% in 4.5 months) Percent weight loss: 24 % (in 4.5 months)   Nutrition Interventions:  Interventions: Tube feeding, TPN  Estimated body mass index is 18.47  kg/m as calculated from the following:   Height as of this encounter: 5\' 4"  (1.626 m).   Weight as of this encounter: 48.8 kg.  Code Status: full code.  DVT Prophylaxis:  Place and maintain sequential compression device Start: 12/15/22 1031 SCDs Start: 12/13/22 0413   Level of Care: Level of care: Progressive Family Communication: none at bedside.   Disposition Plan:     Remains inpatient appropriate:  patient's daughter is going to see [facility] tomorrow and they will decide after the tour .   Procedures:  S/p ex lap  with small bowel resection on 9/5 by Dr. Hillery Hunter s/p ex lap, ileostomy, abdominal closure 9/7 Dr. Marcos Eke venous tunneled catheter on 10/1 Perc drain placement on 10/3 by IR.   Consultants:   Gen surgery ID.  IR Gastroenterology Wound care.   Antimicrobials:   Anti-infectives (From admission, onward)    Start     Dose/Rate Route Frequency Ordered Stop   01/12/23 0000  ciprofloxacin (CIPRO) 400 MG/200ML SOLN        400 mg Intravenous 2 times daily 01/12/23 1359  01/12/23 0000  metroNIDAZOLE (FLAGYL) 500 MG/100ML        500 mg Intravenous 2 times daily 01/12/23 1359     12/29/22 1215  ciprofloxacin (CIPRO) IVPB 400 mg        400 mg 200 mL/hr over 60 Minutes Intravenous 2 times daily 12/29/22 1123     12/29/22 1215  metroNIDAZOLE (FLAGYL) IVPB 500 mg        500 mg 100 mL/hr over 60 Minutes Intravenous 2 times daily 12/29/22 1123     12/28/22 1430  micafungin (MYCAMINE) 100 mg in sodium chloride 0.9 % 100 mL IVPB  Status:  Discontinued        100 mg 105 mL/hr over 1 Hours Intravenous Every 24 hours 12/28/22 1335 01/05/23 0731   12/23/22 2000  vancomycin (VANCOREADY) IVPB 1250 mg/250 mL  Status:  Discontinued        1,250 mg 166.7 mL/hr over 90 Minutes Intravenous Every 36 hours 12/22/22 0612 12/22/22 1103   12/22/22 0645  vancomycin (VANCOREADY) IVPB 1250 mg/250 mL        1,250 mg 166.7 mL/hr over 90 Minutes Intravenous  Once 12/22/22 0551  12/22/22 0805   12/22/22 0600  piperacillin-tazobactam (ZOSYN) IVPB 3.375 g  Status:  Discontinued        3.375 g 12.5 mL/hr over 240 Minutes Intravenous Every 8 hours 12/22/22 0551 12/29/22 1123   12/13/22 1000  piperacillin-tazobactam (ZOSYN) IVPB 3.375 g  Status:  Discontinued        3.375 g 12.5 mL/hr over 240 Minutes Intravenous Every 8 hours 12/13/22 0833 12/20/22 1333        Medications  Scheduled Meds:  ALPRAZolam  0.25 mg Oral BID   ascorbic acid  500 mg Oral BID   diphenoxylate-atropine  2 tablet Oral QID   enoxaparin (LOVENOX) injection  1 mg/kg Subcutaneous Q12H   famotidine  20 mg Oral Daily   feeding supplement (KATE [employer]ARMS STANDARD 1.4)  325 mL Oral BID BM   feeding supplement (KATE FARMS STANDARD 1.4)  360 mL Per Tube Q24H   ferrous sulfate  300 mg Oral BID   fiber  1 packet Oral TID   gabapentin  200 mg Oral Q8H   insulin aspart  0-9 Units Subcutaneous 4 times per day   loperamide HCl  4 mg Oral QID   metoprolol tartrate  50 mg Oral BID   Opium  10 mg Per Tube QID   mouth rinse  15 mL Mouth Rinse 4 times per day   sodium chloride flush  10-40 mL Intracatheter Q12H   sodium chloride flush  5 mL Intracatheter Q8H   Vitamin D (Ergocalciferol)  50,000 Units Oral Q7 days   Continuous Infusions:  ciprofloxacin 400 mg (01/14/23 0915)   metronidazole 500 mg (01/14/23 1228)   promethazine (PHENERGAN) injection (IM or IVPB)     TPN CYCLIC-ADULT (ION)     PRN Meds:.acetaminophen, albuterol, artificial tears, fentaNYL (SUBLIMAZE) injection, food thickener, labetalol, lip balm, LORazepam, [DISCONTINUED] ondansetron **OR** ondansetron (ZOFRAN) IV, mouth rinse, mouth rinse, mouth rinse, mouth rinse, oxyCODONE, promethazine (PHENERGAN) injection (IM or IVPB), silver nitrate applicators, sodium chloride flush    Subjective:   Shanta Calvey was seen and examined today.  She reports feeling good today.  Abd pain has improved.    Objective:   Vitals:   01/14/23 0346  01/14/23 0500 01/14/23 0842 01/14/23 1235  BP: 98/66  107/80 98/65  Pulse: (!) 124  (!) 119 (!) 118  Resp: 18  20 20  Temp: 98.6 F (37 C)  98.4 F (36.9 C) 98.9 F (37.2 C)  TempSrc: Oral  Oral Oral  SpO2: 100%  100% 100%  Weight:  48.8 kg    Height:        Intake/Output Summary (Last 24 hours) at 01/14/2023 1607 Last data filed at 01/14/2023 1557 Gross per 24 hour  Intake 1053 ml  Output 2450 ml  Net -1397 ml   Filed Weights   01/11/23 0500 01/13/23 0500 01/14/23 0500  Weight: 51 kg 49.2 kg 48.8 kg     Exam General exam: Appears calm and comfortable  Respiratory system: Clear to auscultation. Respiratory effort normal. Cardiovascular system: S1 & S2 heard, RRR. No JVD,  Gastrointestinal system: Abdomen is nondistended, soft and nontender.  Has ostomy with yellow fluid in the bag, serosanguinous fluid in the drain. Midline would with good granulation tissue Central nervous system: Alert and oriented. No focal neurological deficits. Extremities: Symmetric 5 x 5 power. Skin: No rashes, l Psychiatry: mood is appropriate.      Data Reviewed:  I have personally reviewed following labs and imaging studies   CBC Lab Results  Component Value Date   WBC 18.2 (H) 01/14/2023   RBC 2.67 (L) 01/14/2023   HGB 7.5 (L) 01/14/2023   HCT 24.6 (L) 01/14/2023   MCV 92.1 01/14/2023   MCH 28.1 01/14/2023   PLT 414 (H) 01/14/2023   MCHC 30.5 01/14/2023   RDW 18.9 (H) 01/14/2023   LYMPHSABS 1.4 01/14/2023   MONOABS 1.7 (H) 01/14/2023   EOSABS 0.1 01/14/2023   BASOSABS 0.1 01/14/2023     Last metabolic panel Lab Results  Component Value Date   NA 131 (L) 01/13/2023   K 4.2 01/13/2023   CL 98 01/13/2023   CO2 24 01/13/2023   BUN 28 (H) 01/13/2023   CREATININE 0.71 01/13/2023   GLUCOSE 152 (H) 01/13/2023   GFRNONAA >60 01/13/2023   GFRAA 108 01/21/2020   CALCIUM 8.3 (L) 01/13/2023   PHOS 3.6 01/13/2023   PROT 6.7 01/12/2023   ALBUMIN 2.1 (L) 01/12/2023   BILITOT  0.4 01/12/2023   ALKPHOS 113 01/12/2023   AST 28 01/12/2023   ALT 21 01/12/2023   ANIONGAP 9 01/13/2023    CBG (last 3)  Recent Labs    01/14/23 0344 01/14/23 0801 01/14/23 1229  GLUCAP 162* 119* 108*      Coagulation Profile: Recent Labs  Lab 01/11/23 1143  INR 1.2     Radiology Studies: No results found.     Kathlen Mody M.D. [facility] 01/14/2023, 4:07 PM  Available via Epic secure chat 7am-7pm After 7 pm, please refer to night coverage provider listed on amion.

## 2023-01-14 NOTE — Progress Notes (Signed)
PHARMACY - TOTAL PARENTERAL NUTRITION CONSULT NOTE   Indication: Short bowel syndrome  Patient Measurements: Height: 5\' 4"  (162.6 cm) Weight: 48.8 kg (107 lb 9.4 oz) IBW/kg (Calculated) : 54.7 TPN AdjBW (KG): 57.8 Body mass index is 18.47 kg/m. Usual Weight: 46.3 kg from 8/28  Assessment: 47 y.o. female with PMH of recurrent breast cancer not currently on treatment, Crohn's disease s/p subtotal colectomy in 2021, recurrent obstructions, colonic stricture, recent hospitalization 8/10-8/15/2024 for obstruction who presented on 12/12/2022 with abdominal pain, nausea, vomiting, diarrhea.    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 9/26 Line holiday for persistent fevers, elevated WBC 9/30 Ok to place new PICC, 10/1 line placed and TPN resumed  Glucose / Insulin: no Hx DM -Steroids PTA for Crohn's disease; weaned off as of 9/13 -CBGs 99-162 -SSI: 5 units / 24hrs Electrolytes: 10/5 labs: Na low at 131, others WNL including CorrCa of 9.8 Renal: WNL, BUN elevated at 28 Hepatic: LFTs WNL. Albumin low, TG ok I/O:  - UOP: 250 mL recorded + 2 unmeasured urine occurrences -Ileostomy 1700  mL; Drain 40 mL; Emesis/NG 1010 mL GI Imaging:  -CTA 9/10 shows new PE, but also abdominal ascites -CT Abd: Large volume ascites GI Surgeries / Procedures:  -OR 9/5 ex lap, internal hernia w/ small bowel ischemia, SBR & celiotomy; > 105 cm small bowel remaining -OR 9/7 for closure, ostomy creation -9/17: Paracentesis -9/23 paracentesis   Central access: powerline 10/1 Triple lumen CVC R subclavian 9/6, PAC 05/02/22 (REMOVED) TPN start date: 12/17/22 Held 9/26-9/30 for line holiday Resumed  10/1  Nutritional Goals: Cyclic TPN (volume 1,680 mL) provides 94 g protein and 1680 kcal per day  RD Assessment: Estimated Needs Total Energy Estimated Needs: 1600-1800 Total Protein Estimated Needs: 85-95 grams Total Fluid Estimated Needs: 1.6L/day  Current Nutrition:  Cyclic TPN (18:00-06:00) Diet: DYS 3 with nectar thickened liquids [employer] 1.4 orally BID, each supplement provides 455 kcal and 20 grams protein  [employer] 360 mL/12 hrs per tube (nocturnal tube feeds)  Plan: -Continue cyclic TPN over 12 hr (18:00- 06:00) -Electrolytes in TPN: Na 150 mEq/L, K 40 mEq/L, Ca 5 mEq/L, Mg 10 mEq/L, Phos 15 mmol/L. Cl:Ac ratio 1:1 -Add standard MVI and trace elements to TPN -Replacing vit D per dietician recommendation per MD order -Continue sensitive SSI &  CBG checks 4x daily while on cyclic TPN (1-2 hr after completing ramp-up, on TPN, 1 hr after TPN stopped, off TPN) -Monitor TPN labs on Mon/Thurs  Lynann Beaver PharmD, BCPS [facility] main pharmacy [phone] 01/14/2023 9:28 AM

## 2023-01-14 NOTE — Plan of Care (Signed)
  Problem: Nutritional: Goal: Maintenance of adequate nutrition will improve Outcome: Progressing Goal: Progress toward achieving an optimal weight will improve Outcome: Progressing   

## 2023-01-14 NOTE — Progress Notes (Signed)
Mobility Specialist - Progress Note   01/14/23 1348  Mobility  Activity Ambulated with assistance in hallway;Ambulated with assistance to bathroom  Level of Assistance Standby assist, set-up cues, supervision of patient - no hands on  Assistive Device Front wheel walker  Distance Ambulated (ft) 100 ft  Activity Response Tolerated well  Mobility Referral Yes  $Mobility charge 1 Mobility  Mobility Specialist Start Time (ACUTE ONLY) 0125  Mobility Specialist Stop Time (ACUTE ONLY) 0147  Mobility Specialist Time Calculation (min) (ACUTE ONLY) 22 min   Pt received in bed and agreeable to mobility. No complaints during session. Upon returning to room, pt requested assistance to the bathroom. Pt to recliner after session with all needs met. Chair alarm on.   [facility]

## 2023-01-15 DIAGNOSIS — Z9049 Acquired absence of other specified parts of digestive tract: Secondary | ICD-10-CM | POA: Diagnosis not present

## 2023-01-15 DIAGNOSIS — D500 Iron deficiency anemia secondary to blood loss (chronic): Secondary | ICD-10-CM | POA: Diagnosis not present

## 2023-01-15 DIAGNOSIS — K90829 Short bowel syndrome, unspecified: Secondary | ICD-10-CM | POA: Diagnosis not present

## 2023-01-15 DIAGNOSIS — D72829 Elevated white blood cell count, unspecified: Secondary | ICD-10-CM | POA: Diagnosis not present

## 2023-01-15 LAB — CBC WITH DIFFERENTIAL/PLATELET
Abs Immature Granulocytes: 0.61 10*3/uL — ABNORMAL HIGH (ref 0.00–0.07)
Basophils Absolute: 0.1 10*3/uL (ref 0.0–0.1)
Basophils Relative: 1 %
Eosinophils Absolute: 0.1 10*3/uL (ref 0.0–0.5)
Eosinophils Relative: 1 %
HCT: 23 % — ABNORMAL LOW (ref 36.0–46.0)
Hemoglobin: 7.1 g/dL — ABNORMAL LOW (ref 12.0–15.0)
Immature Granulocytes: 4 %
Lymphocytes Relative: 12 %
Lymphs Abs: 1.9 10*3/uL (ref 0.7–4.0)
MCH: 28.6 pg (ref 26.0–34.0)
MCHC: 30.9 g/dL (ref 30.0–36.0)
MCV: 92.7 fL (ref 80.0–100.0)
Monocytes Absolute: 1.9 10*3/uL — ABNORMAL HIGH (ref 0.1–1.0)
Monocytes Relative: 12 %
Neutro Abs: 11.5 10*3/uL — ABNORMAL HIGH (ref 1.7–7.7)
Neutrophils Relative %: 70 %
Platelets: 397 10*3/uL (ref 150–400)
RBC: 2.48 MIL/uL — ABNORMAL LOW (ref 3.87–5.11)
RDW: 19.3 % — ABNORMAL HIGH (ref 11.5–15.5)
WBC: 16.1 10*3/uL — ABNORMAL HIGH (ref 4.0–10.5)
nRBC: 0 % (ref 0.0–0.2)

## 2023-01-15 LAB — GLUCOSE, CAPILLARY
Glucose-Capillary: 100 mg/dL — ABNORMAL HIGH (ref 70–99)
Glucose-Capillary: 126 mg/dL — ABNORMAL HIGH (ref 70–99)

## 2023-01-15 LAB — PREPARE RBC (CROSSMATCH)

## 2023-01-15 LAB — COMPREHENSIVE METABOLIC PANEL
ALT: 29 U/L (ref 0–44)
AST: 43 U/L — ABNORMAL HIGH (ref 15–41)
Albumin: 2 g/dL — ABNORMAL LOW (ref 3.5–5.0)
Alkaline Phosphatase: 104 U/L (ref 38–126)
Anion gap: 7 (ref 5–15)
BUN: 31 mg/dL — ABNORMAL HIGH (ref 6–20)
CO2: 26 mmol/L (ref 22–32)
Calcium: 8.3 mg/dL — ABNORMAL LOW (ref 8.9–10.3)
Chloride: 103 mmol/L (ref 98–111)
Creatinine, Ser: 0.66 mg/dL (ref 0.44–1.00)
GFR, Estimated: >60 mL/min (ref >60)
Glucose, Bld: 134 mg/dL — ABNORMAL HIGH (ref 70–99)
Potassium: 4.1 mmol/L (ref 3.5–5.1)
Sodium: 136 mmol/L (ref 135–145)
Total Bilirubin: 0.3 mg/dL (ref 0.3–1.2)
Total Protein: 6.4 g/dL — ABNORMAL LOW (ref 6.5–8.1)

## 2023-01-15 LAB — PHOSPHORUS: Phosphorus: 4.4 mg/dL (ref 2.5–4.6)

## 2023-01-15 LAB — TRIGLYCERIDES: Triglycerides: 259 mg/dL — ABNORMAL HIGH (ref <150)

## 2023-01-15 LAB — MAGNESIUM: Magnesium: 2 mg/dL (ref 1.7–2.4)

## 2023-01-15 MED ORDER — KATE FARMS STANDARD 1.4 PO LIQD
325.0000 mL | Freq: Two times a day (BID) | ORAL | Status: DC
  Administered 2023-01-15 – 2023-01-16 (×2): 325 mL via ORAL
  Filled 2023-01-15 (×3): qty 325

## 2023-01-15 MED ORDER — FERROUS SULFATE 300 (60 FE) MG/5ML PO SOLN
300.0000 mg | Freq: Two times a day (BID) | ORAL | Status: DC
  Administered 2023-01-15 – 2023-01-16 (×2): 300 mg
  Filled 2023-01-15 (×2): qty 5

## 2023-01-15 MED ORDER — NYSTATIN 100000 UNIT/ML MT SUSP
5.0000 mL | Freq: Four times a day (QID) | OROMUCOSAL | Status: DC
  Administered 2023-01-15 – 2023-01-16 (×4): 500000 [IU] via ORAL
  Filled 2023-01-15 (×5): qty 5

## 2023-01-15 MED ORDER — FLUCONAZOLE IN SODIUM CHLORIDE 200-0.9 MG/100ML-% IV SOLN
200.0000 mg | Freq: Once | INTRAVENOUS | Status: DC
  Filled 2023-01-15: qty 100

## 2023-01-15 MED ORDER — TRAVASOL 10 % IV SOLN
INTRAVENOUS | Status: AC
  Filled 2023-01-15: qty 940.8

## 2023-01-15 MED ORDER — KATE FARMS STANDARD 1.4 PO LIQD
325.0000 mL | Freq: Three times a day (TID) | ORAL | Status: DC
  Filled 2023-01-15: qty 325

## 2023-01-15 MED ORDER — GABAPENTIN 250 MG/5ML PO SOLN
200.0000 mg | Freq: Three times a day (TID) | ORAL | Status: DC
  Administered 2023-01-15 – 2023-01-16 (×4): 200 mg
  Filled 2023-01-15 (×4): qty 4

## 2023-01-15 MED ORDER — DIPHENOXYLATE-ATROPINE 2.5-0.025 MG PO TABS
2.0000 | ORAL_TABLET | Freq: Four times a day (QID) | ORAL | Status: DC
  Administered 2023-01-15 – 2023-01-16 (×4): 2
  Filled 2023-01-15 (×4): qty 2

## 2023-01-15 MED ORDER — SODIUM CHLORIDE 0.9% IV SOLUTION: Freq: Once | INTRAVENOUS | Status: AC

## 2023-01-15 MED ORDER — ADULT MULTIVITAMIN LIQUID CH
15.0000 mL | Freq: Every day | ORAL | Status: DC
  Administered 2023-01-15 – 2023-01-16 (×2): 15 mL
  Filled 2023-01-15 (×2): qty 15

## 2023-01-15 NOTE — Plan of Care (Signed)
  Problem: Education: Goal: Ability to describe self-care measures that may prevent or decrease complications (Diabetes Survival Skills Education) will improve Outcome: Progressing Goal: Individualized Educational Video(s) Outcome: Progressing   Problem: Coping: Goal: Ability to adjust to condition or change in health will improve Outcome: Progressing   Problem: Fluid Volume: Goal: Ability to maintain a balanced intake and output will improve Outcome: Progressing   Problem: Health Behavior/Discharge Planning: Goal: Ability to identify and utilize available resources and services will improve Outcome: Progressing Goal: Ability to manage health-related needs will improve Outcome: Progressing   Problem: Metabolic: Goal: Ability to maintain appropriate glucose levels will improve Outcome: Progressing   Problem: Nutritional: Goal: Maintenance of adequate nutrition will improve Outcome: Progressing Goal: Progress toward achieving an optimal weight will improve Outcome: Progressing   Problem: Skin Integrity: Goal: Risk for impaired skin integrity will decrease Outcome: Progressing   Problem: Tissue Perfusion: Goal: Adequacy of tissue perfusion will improve Outcome: Progressing   Problem: Education: Goal: Knowledge of General Education information will improve Description: Including pain rating scale, medication(s)/side effects and non-pharmacologic comfort measures Outcome: Progressing   Problem: Health Behavior/Discharge Planning: Goal: Ability to manage health-related needs will improve Outcome: Progressing   Problem: Clinical Measurements: Goal: Ability to maintain clinical measurements within normal limits will improve Outcome: Progressing Goal: Will remain free from infection Outcome: Progressing Goal: Diagnostic test results will improve Outcome: Progressing Goal: Respiratory complications will improve Outcome: Progressing Goal: Cardiovascular complication will  be avoided Outcome: Progressing   Problem: Activity: Goal: Risk for activity intolerance will decrease Outcome: Progressing   Problem: Nutrition: Goal: Adequate nutrition will be maintained Outcome: Progressing   Problem: Coping: Goal: Level of anxiety will decrease Outcome: Progressing   Problem: Elimination: Goal: Will not experience complications related to bowel motility Outcome: Progressing Goal: Will not experience complications related to urinary retention Outcome: Progressing   Problem: Pain Managment: Goal: General experience of comfort will improve Outcome: Progressing   Problem: Safety: Goal: Ability to remain free from injury will improve Outcome: Progressing   Problem: Skin Integrity: Goal: Risk for impaired skin integrity will decrease Outcome: Progressing   Problem: Fluid Volume: Goal: Hemodynamic stability will improve Outcome: Progressing   Problem: Clinical Measurements: Goal: Diagnostic test results will improve Outcome: Progressing Goal: Signs and symptoms of infection will decrease Outcome: Progressing   Problem: Respiratory: Goal: Ability to maintain adequate ventilation will improve Outcome: Progressing   

## 2023-01-15 NOTE — Progress Notes (Signed)
[facility]                                                                               [name], is a 47 y.o. female, DOB - 1976/04/01, WJX:[id number] Admit date - 12/12/2022    Outpatient Primary MD for the patient is Pahwani, Kasandra Knudsen, MD  LOS - 33  days    Brief summary   [name] is a 47 y.o. female with PMH significant for Crohn's disease with previous bowel obstruction s/p subtotal colectomy 2021, colonic stricture, IDA, breast cancer s/p mastectomy 2022 currently not on treatment. Recently hospitalized 8/10-8/15/2024 for bowel obstruction seen by GI and general surgery. Underwent EGD and flexible sigmoidoscopy and discharged on steroids.   9/3, patient presented to the ED with abdominal distention, pain, nausea, vomiting CT abd/pelvis showed markedly dilated distal small bowel consistent with chronic obstruction. No changes since prior study.  Patient was admitted to [facility] with a plan of GI consultation.   9/4, in the morning, within few hours of admission, patient was found pulseless on the floor in the bathroom in ED, estimated downtime about 5 minutes. ACLS was initiated. Concern for possible torsades de pointes and given IV mag, defibrillated.  ROSC achieved in about 5 minutes. She was intubated. She had copious vomitus in the ETT.  Notably, she had I-stat peri-code which showed hemoglobin of 5.5 down from 10 overnight.  Admitted to ICU, underwent 3 units of PRBC and 3 units of FFP. She was started on vasopressors.   9/5, underwent exploratory laparotomy.  Noted to have internal hernia with associated small bowel ischemia.  Underwent bowel resection. 9/7, back to OR for ostomy placement 9/8, weaned off Levophed 9/10, extubated 9/10, found to have PE in CT angio chest.  Started on heparin drip which was subsequently stopped the next day because of drop in hemoglobin 9/12, mental status improved, became more interactive.  However failed swallow  eval. 9/16, transferred out to [facility] 9/16, CT abdomen showed peritoneal fluid collection 9/17, underwent paracentesis, 500 cc of clear yellow fluid drained 9/22, CT abdomen raised concern of early developing obstruction, peritonitis.  9/23, paracentesis by IR.  Drained 90 cc of turbid, amber/blood-tinged fluid 10/2 CT abd and pelvis showing Large central fluid collection measuring 19 x 6 cm, similar to prior study. Fluid collection contains locules of gas. 10/3 perc drain placement with serosanguinous fluid in the suction bulb. .     Patient's hospitalization has been prolonged because of persistent fever, persistent high output from ostomy, anemia requiring blood transfusion, dysphagia.   Assessment & Plan    Assessment and Plan:   Internal hernia with bowel ischemia/prior subtotal colectomy for Crohn's disease/short gut syndrome S/p ex lap  with small bowel resection on 9/5 by Dr. Hillery Hunter s/p ex lap, ileostomy, abdominal closure 9/7 Dr. Hillery Hunter Currently only has 100 cm of small bowel left , short gut syndrome TPN dependent.  To slow down ostomy output, patient is currently on Imodium, iron, FiberCon, Imodium.  She continues to have significant ostomy output.   Pain is better controlled.   Surgery following and managing. Patient requesting for nutrition by mouth during the day ,  tube feeds during the night and TPN at night.  Nonew complaints.     Intermittent fever/sepsis PICC line removed on 9/26 and repeat blood cultures have been negative so far. Port-A-Cath was removed on 9/27 and a tunneled central venous catheter placed on 01/09/2023 Patient currently on ciprofloxacin and metronidazole for now.  ID on board , recommendations to follow regarding the antibiotics, currently waiting for final culture report from the large pelvic abscess.  CT abd and pelvis showing Large central fluid collection measuring 19 x 6 cm, similar to prior study. Fluid collection contains locules of  gas.  Improving leukocytosis with drain placement. Wbc count is  down to 16,200.  IR consulted and she underwent  drain placement for the large pelvic abscess.  Repeat imaging/possible drain injection once output < 10 mL/QD (excluding flush material). Consideration for drain removal if output is < 10 mL/QD (excluding flush material), pending discussion with the providing surgical service. Final recommendations to follow from cultures .  Dysphagia Postextubation, patient failed speech therapy reevaluation .  Gradually improving and currently on dysphagia 3 diet, tube feeding during the day and TPN during the night.  SLP following.    Persistent sinus tachycardia: ? Deconditioning, vs from PE.  Asymptomatic. Pain sub optimal.  Echo showed LVEF of 60 to 65%, Left ventricle has normal function. Right ventricular systolic function is normal.  On metoprolol 50 mg BID.  Remains tachycardic due to disease process.     Acute pulmonary embolism on the left lower lobe Patient is on IV heparin restarted on 12/30/2022, transition to lovenox.  Monitor H&h while on anti coagulation.  Hemoglobin still borderline around 7.    Anemia of acute illness superimposed on anemia of chronic disease/anemia of from blood loss postop S/p 3 units of PRBC transfusions. Hemoglobin slowly dropping from 8.3 to 7.5 to 7.7 to 7.5   to 7.1 today. 1 unit of prbc transfusion ordered. Recheck hemoglobin tomorrow.    Hypomagnesemia Replaced, repeat level wnl.    Mild hyponatremia Asymptomatic   Recurrent breast cancer Follow-up with oncology Dr. Pamelia Hoit  Malnutrition Type:  Nutrition Problem: Moderate Malnutrition Etiology: chronic illness (Crohn's disease; metastatic breast cancer)   Malnutrition Characteristics:  Signs/Symptoms: moderate fat depletion, moderate muscle depletion, percent weight loss (24% in 4.5 months) Percent weight loss: 24 % (in 4.5 months)   Nutrition Interventions:  Interventions:  Tube feeding, TPN  Estimated body mass index is 19.45 kg/m as calculated from the following:   Height as of this encounter: 5\' 4"  (1.626 m).   Weight as of this encounter: 51.4 kg.  Code Status: full code.  DVT Prophylaxis:  Place and maintain sequential compression device Start: 12/15/22 1031 SCDs Start: 12/13/22 0413   Level of Care: Level of care: Progressive Family Communication: none at bedside.   Disposition Plan:     Remains inpatient appropriate:  patient's daughter is going to see [facility] tomorrow and they will decide after the tour .   Procedures:  S/p ex lap  with small bowel resection on 9/5 by Dr. Hillery Hunter s/p ex lap, ileostomy, abdominal closure 9/7 Dr. Marcos Eke venous tunneled catheter on 10/1 Perc drain placement on 10/3 by IR.   Consultants:   Gen surgery ID.  IR Gastroenterology Wound care.   Antimicrobials:   Anti-infectives (From admission, onward)    Start     Dose/Rate Route Frequency Ordered Stop   01/12/23 0000  ciprofloxacin (CIPRO) 400 MG/200ML SOLN        400 mg  Intravenous 2 times daily 01/12/23 1359     01/12/23 0000  metroNIDAZOLE (FLAGYL) 500 MG/100ML        500 mg Intravenous 2 times daily 01/12/23 1359     12/29/22 1215  ciprofloxacin (CIPRO) IVPB 400 mg        400 mg 200 mL/hr over 60 Minutes Intravenous 2 times daily 12/29/22 1123     12/29/22 1215  metroNIDAZOLE (FLAGYL) IVPB 500 mg        500 mg 100 mL/hr over 60 Minutes Intravenous 2 times daily 12/29/22 1123     12/28/22 1430  micafungin (MYCAMINE) 100 mg in sodium chloride 0.9 % 100 mL IVPB  Status:  Discontinued        100 mg 105 mL/hr over 1 Hours Intravenous Every 24 hours 12/28/22 1335 01/05/23 0731   12/23/22 2000  vancomycin (VANCOREADY) IVPB 1250 mg/250 mL  Status:  Discontinued        1,250 mg 166.7 mL/hr over 90 Minutes Intravenous Every 36 hours 12/22/22 0612 12/22/22 1103   12/22/22 0645  vancomycin (VANCOREADY) IVPB 1250 mg/250 mL        1,250 mg 166.7  mL/hr over 90 Minutes Intravenous  Once 12/22/22 0551 12/22/22 0805   12/22/22 0600  piperacillin-tazobactam (ZOSYN) IVPB 3.375 g  Status:  Discontinued        3.375 g 12.5 mL/hr over 240 Minutes Intravenous Every 8 hours 12/22/22 0551 12/29/22 1123   12/13/22 1000  piperacillin-tazobactam (ZOSYN) IVPB 3.375 g  Status:  Discontinued        3.375 g 12.5 mL/hr over 240 Minutes Intravenous Every 8 hours 12/13/22 0833 12/20/22 1333        Medications  Scheduled Meds:  ALPRAZolam  0.25 mg Oral BID   ascorbic acid  500 mg Oral BID   diphenoxylate-atropine  2 tablet Per Tube QID   enoxaparin (LOVENOX) injection  1 mg/kg Subcutaneous Q12H   famotidine  20 mg Oral Daily   feeding supplement ([employer] STANDARD 1.4)  325 mL Oral BID BM   feeding supplement (KATE [employer]MS STANDARD 1.4)  360 mL Per Tube Q24H   ferrous sulfate  300 mg Per Tube BID   fiber  1 packet Oral TID   gabapentin  200 mg Per Tube Q8H   loperamide HCl  4 mg Oral QID   metoprolol tartrate  50 mg Oral BID   multivitamin  15 mL Per Tube Daily   Opium  10 mg Per Tube QID   mouth rinse  15 mL Mouth Rinse 4 times per day   sodium chloride flush  10-40 mL Intracatheter Q12H   sodium chloride flush  5 mL Intracatheter Q8H   Vitamin D (Ergocalciferol)  50,000 Units Oral Q7 days   Continuous Infusions:  ciprofloxacin 400 mg (01/15/23 0928)   metronidazole 500 mg (01/15/23 0928)   promethazine (PHENERGAN) injection (IM or IVPB)     TPN CYCLIC-ADULT (ION)     PRN Meds:.acetaminophen, albuterol, artificial tears, fentaNYL (SUBLIMAZE) injection, food thickener, labetalol, lip balm, LORazepam, [DISCONTINUED] ondansetron **OR** ondansetron (ZOFRAN) IV, mouth rinse, mouth rinse, mouth rinse, mouth rinse, oxyCODONE, promethazine (PHENERGAN) injection (IM or IVPB), silver nitrate applicators, sodium chloride flush    Subjective:   [name] was seen and examined today.  No new complaints today.    Objective:   Vitals:    01/15/23 0500 01/15/23 0714 01/15/23 0940 01/15/23 1237  BP: 94/69  103/66 102/67  Pulse: 83  (!) 115 (!) 128  Resp: 16  16 16   Temp: 98.4 F (36.9 C)  (!) 97.5 F (36.4 C) 98.4 F (36.9 C)  TempSrc: Oral  Oral Oral  SpO2: 100%  100% 100%  Weight:  51.4 kg    Height:        Intake/Output Summary (Last 24 hours) at 01/15/2023 1337 Last data filed at 01/15/2023 1042 Gross per 24 hour  Intake 2231.82 ml  Output 1356 ml  Net 875.82 ml   Filed Weights   01/13/23 0500 01/14/23 0500 01/15/23 0714  Weight: 49.2 kg 48.8 kg 51.4 kg     Exam General exam: Appears calm and comfortable  Respiratory system: Clear to auscultation. Respiratory effort normal. Cardiovascular system: S1 & S2 heard, RRR.  Gastrointestinal system: Abdomen is soft no tenderness. Ostomy in place, and drain with serosanguinous fluid in the bulb.  Central nervous system: Alert and oriented. No focal neurological deficits. Extremities: Symmetric 5 x 5 power. Skin: No rashes, lesions or ulcers Psychiatry:  Mood & affect appropriate.        Data Reviewed:  I have personally reviewed following labs and imaging studies   CBC Lab Results  Component Value Date   WBC 16.1 (H) 01/15/2023   RBC 2.48 (L) 01/15/2023   HGB 7.1 (L) 01/15/2023   HCT 23.0 (L) 01/15/2023   MCV 92.7 01/15/2023   MCH 28.6 01/15/2023   PLT 397 01/15/2023   MCHC 30.9 01/15/2023   RDW 19.3 (H) 01/15/2023   LYMPHSABS 1.9 01/15/2023   MONOABS 1.9 (H) 01/15/2023   EOSABS 0.1 01/15/2023   BASOSABS 0.1 01/15/2023     Last metabolic panel Lab Results  Component Value Date   NA 136 01/15/2023   K 4.1 01/15/2023   CL 103 01/15/2023   CO2 26 01/15/2023   BUN 31 (H) 01/15/2023   CREATININE 0.66 01/15/2023   GLUCOSE 134 (H) 01/15/2023   GFRNONAA >60 01/15/2023   GFRAA 108 01/21/2020   CALCIUM 8.3 (L) 01/15/2023   PHOS 4.4 01/15/2023   PROT 6.4 (L) 01/15/2023   ALBUMIN 2.0 (L) 01/15/2023   BILITOT 0.3 01/15/2023   ALKPHOS 104  01/15/2023   AST 43 (H) 01/15/2023   ALT 29 01/15/2023   ANIONGAP 7 01/15/2023    CBG (last 3)  Recent Labs    01/14/23 1952 01/14/23 2359 01/15/23 0731  GLUCAP 140* 126* 100*      Coagulation Profile: Recent Labs  Lab 01/11/23 1143  INR 1.2     Radiology Studies: No results found.     Kathlen Mody M.D. [facility] 01/15/2023, 1:37 PM  Available via Epic secure chat 7am-7pm After 7 pm, please refer to night coverage provider listed on amion.

## 2023-01-15 NOTE — Consult Note (Addendum)
WOC Nurse ostomy follow up  Surgical team following for assessment and plan of care for abd wound.  Stoma type/location: LLQ ileostomy  Stomal assessment/size: 1 1/4" well budded, pink moist round.  Wound is in close proximity. Output: mod amt liquid green effluent in high output pouch to bedside drainage bag.  Applied barrier ring and 2 piece pouch. Pt watched the procedure and asked appropriate questions.   Extra supplies in the room for staff nurses' use.  Use supplies: skin barrier [employer] [place] [phone]), 2 1/4" high output pouch [employer] [place] [phone]) and 2" barrier ring [place] [phone])  WOC team will continue teaching sessions twice weekly. Enrolled patient in [facility] Secure Start Discharge program: Yes previously. Thank-you,  Cammie Mcgee MSN, RN, CWOCN, [place], CNS [phone]

## 2023-01-15 NOTE — Progress Notes (Addendum)
10 Days Post-Op   Subjective/Chief Complaint: Ileostomy output seems slightly less and thicker than yesterday Taking in POs well (supplements and dys 3) and agreeable to discontinuing tube feeds No abdominal pain/n/v  Objective: Vital signs in last 24 hours: Temp:  [97.5 F (36.4 C)-99.1 F (37.3 C)] 98.4 F (36.9 C) (10/07 1237) Pulse Rate:  [83-128] 128 (10/07 1237) Resp:  [16-18] 16 (10/07 1237) BP: (89-103)/(53-69) 102/67 (10/07 1237) SpO2:  [100 %] 100 % (10/07 1237) Weight:  [51.4 kg] 51.4 kg (10/07 0714) Last BM Date : 01/15/23  Intake/Output from previous day: 10/06 0701 - 10/07 0700 In: 2301.8 [P.O.:120; I.V.:1681.8; NG/GT:190; IV Piggyback:300] Out: 1355 [Urine:125; Drains:30; Stool:1200] Intake/Output this shift: Total I/O In: -  Out: 1 [Urine:1]  WDWN in NAD Abd - soft, minimal tenderness Wound - well-granulated; clean; minimal drainage Ostomy - copious thin output Drain - clear serosanguinous fluid  Lab Results:  Recent Labs    01/14/23 0257 01/15/23 0313  WBC 18.2* 16.1*  HGB 7.5* 7.1*  HCT 24.6* 23.0*  PLT 414* 397   BMET Recent Labs    01/13/23 0306 01/15/23 0313  NA 131* 136  K 4.2 4.1  CL 98 103  CO2 24 26  GLUCOSE 152* 134*  BUN 28* 31*  CREATININE 0.71 0.66  CALCIUM 8.3* 8.3*   PT/INR No results for input(s): "LABPROT", "INR" in the last 72 hours.  ABG No results for input(s): "PHART", "HCO3" in the last 72 hours.  Invalid input(s): "PCO2", "PO2"  Studies/Results: No results found.  Anti-infectives: Anti-infectives (From admission, onward)    Start     Dose/Rate Route Frequency Ordered Stop   01/12/23 0000  ciprofloxacin (CIPRO) 400 MG/200ML SOLN        400 mg Intravenous 2 times daily 01/12/23 1359     01/12/23 0000  metroNIDAZOLE (FLAGYL) 500 MG/100ML        500 mg Intravenous 2 times daily 01/12/23 1359     12/29/22 1215  ciprofloxacin (CIPRO) IVPB 400 mg        400 mg 200 mL/hr over 60 Minutes Intravenous 2 times  daily 12/29/22 1123     12/29/22 1215  metroNIDAZOLE (FLAGYL) IVPB 500 mg        500 mg 100 mL/hr over 60 Minutes Intravenous 2 times daily 12/29/22 1123     12/28/22 1430  micafungin (MYCAMINE) 100 mg in sodium chloride 0.9 % 100 mL IVPB  Status:  Discontinued        100 mg 105 mL/hr over 1 Hours Intravenous Every 24 hours 12/28/22 1335 01/05/23 0731   12/23/22 2000  vancomycin (VANCOREADY) IVPB 1250 mg/250 mL  Status:  Discontinued        1,250 mg 166.7 mL/hr over 90 Minutes Intravenous Every 36 hours 12/22/22 0612 12/22/22 1103   12/22/22 0645  vancomycin (VANCOREADY) IVPB 1250 mg/250 mL        1,250 mg 166.7 mL/hr over 90 Minutes Intravenous  Once 12/22/22 0551 12/22/22 0805   12/22/22 0600  piperacillin-tazobactam (ZOSYN) IVPB 3.375 g  Status:  Discontinued        3.375 g 12.5 mL/hr over 240 Minutes Intravenous Every 8 hours 12/22/22 0551 12/29/22 1123   12/13/22 1000  piperacillin-tazobactam (ZOSYN) IVPB 3.375 g  Status:  Discontinued        3.375 g 12.5 mL/hr over 240 Minutes Intravenous Every 8 hours 12/13/22 0833 12/20/22 1333       Assessment/Plan:  Hx Crohn's disease s/p subtotal colectomy  Internal hernia with bowel ischemia - POD 30 exploratory laparotomy with small bowel resection 9/5 Dr. Hillery Hunter - POD 28  s/p exploratory laparotomy, ileostomy, abdominal closure 9/7 Dr. 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 Highly likely to have high ostomy output              - added Paregoric (tincture of opium) to try to slow gut motility. Some improvement today 1700 to 1200 ml/24h - s/p PAC removal 9/27 for fever of unknown origin. -  Pre-alb 11 and Albumin < 1.5 on 9/19 - Imodium 4mg  QID, Iron 300mg  BID, Fibercon 625mg  TID. lomotil 4 mg QID. Tincture of opium - added 10/5 - DYS-3 diet, new CVC placed by IR 10/1 >> TPN, discontinue tubefeeds - Diet per SLP. DYS-3 - poor PO intake at current, PO supplements per  dietician. - WOCN following for new ostomy. Plan ostomy clinic referral at d/c - VAC removed 10/4, daily WTD - CT A/P 9/16 done. No indication for return to the OR. Low suspicion for bowel ischemia currently. Ascites without signs of infection on para. Lactate 9/25 WNL. Central line removed 9/26. Port removed 9/27. WBC down trending. Fevers resolved. CT AP 10/2  with large collection - IR drain placed  10/3 - no organisms on gram stain, ?seroma - ABx and anti-fungals per ID - PT/OT - Palliative following peripherally currently.      ID - Zosyn 9/4>>9/11. 9/13 >> Micafungin 9/19 >> fevers resolved since port removal.  LE [place] 9/18 neg. UE [place] 9/19 neg. UA neg. CXR w/ L > R opacities with b/l pleural effusions. Resp Cx NGTD. Bcx including specific fungal blood cx NGTD. PAC out 9/27- WBC 26.    FEN - diet per SLP, TPN VTE - SCDs, Hep gtt  Foley - Failed TOV. Foley out 9/27. voiding.   Dispo: continue to trend WBC. Monitor ileostomy output. Stop tube feeds     - per TRH - PE - Noted on CTA 9/10. LE [place] negative for DVT. Heparin gtt. Hgb 7.7, continue to monitor. Agree with CCM that we may need to consider IVC temporary filter at some point if unable to restart heparin gtt (hx breast ca, recent surgery, immobilized). Shock - off pressors VDRF - extubated  Recent cardiac arrest 9/3 AKI - Cr stable on most recent labs Anemia - Hgb slightly increased today.  Transfuse to keep hemoglobin above 7. Consider IV iron supplementation - she has short gut so worried she will not absorb iron well orally. Last PT-INR 9/15 was 1.3.  Hx metastatic breast cancer     LOS: 33 days    Eric Form, [facility] Surgery 01/15/2023, 1:15 PM Please see Amion for pager number during day hours 7:00am-4:30pm

## 2023-01-15 NOTE — TOC Progression Note (Signed)
Transition of Care [facility]) - Progression Note    Patient Details  Name: [name] MRN: [id number] Date of Birth: [birth year]  [facility] CM/SW Contact  Howell Rucks, RN Phone Number: 01/15/2023, 3:35 PM  Clinical Narrative:  Met with pt at bedside to introduce self and role of TOC/NCM, pt reports her dtr has visit scheduled at [facility] tomorrow. TOC will follow up.      Expected Discharge Plan: Home/Self Care (unsure at this time) Barriers to Discharge: Continued Medical Work up  Expected Discharge Plan and Services In-house Referral: Hospice / Palliative Care Discharge Planning Services: CM Consult Post Acute Care Choice: NA Living arrangements for the past 2 months: Single Family Home Expected Discharge Date: 01/12/23               DME Arranged: N/A DME Agency: NA       HH Arranged: NA HH Agency: NA         Social Determinants of Health (SDOH) Interventions SDOH Screenings   Food Insecurity: No Food Insecurity (11/21/2022)  Housing: Patient Unable To Answer (11/21/2022)  Transportation Needs: No Transportation Needs (11/21/2022)  Utilities: Not At Risk (11/21/2022)  Tobacco Use: Medium Risk (01/05/2023)    Readmission Risk Interventions    12/20/2022    3:50 PM 12/15/2022    2:43 PM  Readmission Risk Prevention Plan  Transportation Screening Complete Complete  PCP or Specialist Appt within 3-5 Days  Complete  HRI or Home Care Consult  Complete  Social Work Consult for Recovery Care Planning/Counseling  Complete  Palliative Care Screening  Complete  Medication Review Oceanographer) Complete Complete  PCP or Specialist appointment within 3-5 days of discharge Complete   HRI or Home Care Consult Complete   SW Recovery Care/Counseling Consult Complete   Palliative Care Screening Complete   Skilled Nursing Facility Not Applicable

## 2023-01-15 NOTE — Progress Notes (Signed)
PHARMACY - TOTAL PARENTERAL NUTRITION CONSULT NOTE   Indication: Short bowel syndrome  Patient Measurements: Height: 5\' 4"  (162.6 cm) Weight: 51.4 kg (113 lb 5.1 oz) IBW/kg (Calculated) : 54.7 TPN AdjBW (KG): 57.8 Body mass index is 19.45 kg/m. Usual Weight: 46.3 kg from 8/28  Assessment: 47 y.o. female with PMH of recurrent breast cancer not currently on treatment, Crohn's disease s/p subtotal colectomy in 2021, recurrent obstructions, colonic stricture, recent hospitalization 8/10-8/15/2024 for obstruction who presented on 12/12/2022 with abdominal pain, nausea, vomiting, diarrhea.    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 9/26 Line holiday for persistent fevers, elevated WBC 9/30 Ok to place new PICC, 10/1 line placed and TPN resumed  Glucose / Insulin: no Hx DM -Steroids PTA for Crohn's disease; weaned off as of 9/13 -CBGs 140 & 126 during cycle -SSI: 2 units / 24hrs Electrolytes: all WNL inclucing Corrected Ca Renal: WNL, BUN elevated at 31 Hepatic: LFTs WNL. Albumin low, TG 259 on 10/7 I/O:  - UOP: 125 mL recorded  -Ileostomy 1200  mL; Drain 30 mL;  GI Imaging:  -CTA 9/10 shows new PE, but also abdominal ascites -CT Abd: Large volume ascites GI Surgeries / Procedures:  -OR 9/5 ex lap, internal hernia w/ small bowel ischemia, SBR & celiotomy; > 105 cm small bowel remaining -OR 9/7 for closure, ostomy creation -9/17: Paracentesis -9/23 paracentesis   Central access: powerline 10/1 Triple lumen CVC R subclavian 9/6, PAC 05/02/22 (REMOVED) TPN start date: 12/17/22 Held 9/26-9/30 for line holiday Resumed 10/1  Nutritional Goals: Cyclic TPN (volume 1,680 mL) provides  94 g protein and 1680 kcal per day  RD Assessment: Estimated Needs Total Energy Estimated Needs: 1600-1800 Total Protein Estimated Needs: 85-95 grams Total Fluid Estimated Needs: 1.6L/day  Current Nutrition:  Cyclic TPN (18:00-06:00) Diet: DYS 3  [employer] 1.4 325 mls orally BID, each supplement provides 455 kcal and 20 grams protein  [employer] 360 mL/12 hrs per tube (nocturnal tube feeds)  Plan: -Continue cyclic TPN over 12 hr (18:00- 06:00) -Electrolytes in TPN: Na 150 mEq/L, K 40 mEq/L, Ca 5 mEq/L, Mg 10 mEq/L, Phos 15 mmol/L. Cl:Ac ratio 1:1 - change MVI to per tube -Replacing vit D per dietician recommendation per MD order -DC SSI & DC CBG checks -Monitor TPN labs on Mon/Thurs  Herby Abraham, Pharm.D Use secure chat for questions 01/15/2023 9:12 AM

## 2023-01-15 NOTE — Plan of Care (Signed)
  Problem: Education: Goal: Ability to describe self-care measures that may prevent or decrease complications (Diabetes Survival Skills Education) will improve Outcome: Progressing Goal: Individualized Educational Video(s) Outcome: Progressing   Problem: Coping: Goal: Ability to adjust to condition or change in health will improve Outcome: Progressing   Problem: Health Behavior/Discharge Planning: Goal: Ability to identify and utilize available resources and services will improve Outcome: Progressing

## 2023-01-15 NOTE — Progress Notes (Signed)
Speech Language Pathology Treatment: Dysphagia  Patient Details Name: [name] MRN: [id number] DOB: May 24, 1975 Today's Date: 01/15/2023 Time: 1440-1500 SLP Time Calculation (min) (ACUTE ONLY): 20 min  Assessment / Plan / Recommendation Clinical Impression  Patient seen by SLP for skilled intervention focused on diet toleration. Patient was awake and alert with no family at bedside when SLP entered the room. The patient's voice was stronger and more clear than it has been during this hospitalization. She stated that she feels her voice is 80% back to her baseline. Her lunch tray was about 50% completed. Patient indicated that she has been tolerating cup sips of thin liquids well. She expressed interest in advancing to regular diet now that she has become comfortable with solids again. SLP directly observed cup sips of thin liquid. No overt s/sx of dysphagia noted. SLP recommends advancement to regular diet. Patient may request softer food or chopped food as needed. Thin liquids via cup sips or teaspoons. ST will continue to follow for diet toleration and continued patient/family education.   HPI HPI: Per CCM note "47 year old female with past medical history of recurrent breast cancer not currently on treatment, Crohn's disease s/p subtotal colectomy in 2021 recurrent obstructions, colonic stricture, IDA, recent hospitalization 8/10-8/15/2024 for obstruction seen by GI and general surgery. Underwent EGD and flexible sigmoidoscopy and discharged on steroids who presented to the emergency department on 12/12/2022 with abdominal pain, nausea, vomiting diarrhea. In the ED tachycardic, hypertensive. ED provider notes abdominal distention. She was given 2L IVF, morphine, IV PPI. Labs with WBC 10.8, Hgb 10.7, K 3.2. CT AP was ordered and showed markedly dilated distal small bowel consistent with chronic obstruction. 12/13/2022 patient was found on the floor in the bathroom in ED pulseless. They estimate  downtime around 5 minutes. ACLS was initiated. Concern for possible torsades de pointes and given IV mag, defibrillated. Coded for around 5 minutes. She was intubated. She had copious vomitus in the ETT. ROSC achieved and ICU was consulted. Notably, she had I-stat peri-code which showed hemoglobin of 5.5 down from 10 overnight."  Pt was extubated 12/18/2021.  Pt has been maintained on TPN and is now getting trickle feeds for nutrition. SLP following for dysphagia management.  Pt dislikes puree foods per notes on 9/23 and diet was advanced to mechanical soft on 9/24.  Intake remains compromised as pt reports feeling full due to overnight feeds - therefore tube feeding stopped per surgery.  She is continuing to have fevers but denies worsening pain - ID on board.  Pt has remained aphonic since intubation 9/4 - and extubation on 9/10.      SLP Plan  Continue with current plan of care      Recommendations for follow up therapy are one component of a multi-disciplinary discharge planning process, led by the attending physician.  Recommendations may be updated based on patient status, additional functional criteria and insurance authorization.    Recommendations  Diet recommendations: Regular;Thin liquid Liquids provided via: Cup;Teaspoon Medication Administration: Whole meds with puree Supervision: Patient able to self feed Compensations: Slow rate;Small sips/bites Postural Changes and/or Swallow Maneuvers: Seated upright 90 degrees;Upright 30-60 min after meal                  Oral care BID   Intermittent Supervision/Assistance Dysphagia, oropharyngeal phase (R13.12)     Continue with current plan of care     Marline Backbone, Senaida Lange., Speech Therapy Student    01/15/2023, 3:14 PM

## 2023-01-15 NOTE — Progress Notes (Signed)
Nutrition Follow-up  DOCUMENTATION CODES:   Non-severe (moderate) malnutrition in context of chronic illness  INTERVENTION:   -Per surgery, will d/c the 12 hour tube feedings at this time.  -Continue [employer] 1.4 PO BID, each provides 455 kcals and 20g protein  -TPN management per Pharmacy - Continue MVI in TPN. -Daily weights while on TPN  - Continue 500mg  vitamin C BID   Patient will need to be TPN dependent long term.    NUTRITION DIAGNOSIS:   Moderate Malnutrition related to chronic illness (Crohn's disease; metastatic breast cancer) as evidenced by moderate fat depletion, moderate muscle depletion, percent weight loss (24% in 4.5 months).  Ongoing.  GOAL:   Patient will meet greater than or equal to 90% of their needs  Meeting with TPN and PO  MONITOR:   Supplement acceptance, PO intake, Labs, Weight trends, I & O's (TPN)  REASON FOR ASSESSMENT:   Consult Assessment of nutrition requirement/status, Enteral/tube feeding initiation and management  ASSESSMENT:   47 y.o. female with PMH of recurrent breast cancer not currently on treatment, Crohn's disease s/p subtotal colectomy in 2021, recurrent obstructions, colonic stricture, recent hospitalization 8/10-8/15/2024 for obstruction who presented on 12/12/2022 with abdominal pain, nausea, vomiting diarrhea.  9/4 Admit; found down in ED bathroom with no pulse, intubated 9/5 s/p ex-lap, found to have internal hernia with small bowel ischemia, only ~100cm of small bowel remaining 9/7 ex-lap, I&D, end ileostomy 9/8 TPN started at 88mL/hr 9/9 TPN increased to 51mL/hr 9/10 extubated, NGT removed but then replaced. 9/12: TF stopped 9/14: small bore NGT placed for medications, MBS performed 9/18: trickle TF (64mL/hr) of V[employer] 1.2 started, starting fat soluble vitamin supplementation 9/19: Transitioned to 12 hour nocturnal TF 9/20: MBS -> diet advanced to DYS 1 with nectar thick liquids 9/24: diet advanced to DYS 3  with nectar thick liquids; nocturnal TF increased to 25mL/hr 9/25: Nocturnal TF discontinued 9/26: TPN d/c for line holiday; nocturnal TF restarted at 84mL/hr 9/30: TPN resumed  Per surgery, plan is to d/c tube feedings today. Nutrition will be provided primarily from TPN and oral diet. Will continue [employer] BID PO.  As patient has only 100cm bowel left, she will need to be TPN dependent long term. 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   Cyclic TPN continues: Start rate at 76 mL/hr for 1 hour.  Increase rate to 153 mL/hr for 10 hours.  Decrease rate to 76 mL/hr for 1 hour.  Provides 1680 kcals and 94g protein.  Admission weight: 125 lbs Current weight: 113 lbs   Medications: Vitamin C, Lomotil, Pepcid, Ferrous sulfate, Nutrisource fiber, Imodium, Opium tincture, Vitamin D weekly  Labs reviewed: CBGs: 98-162 TG 259  Diet Order:   Diet Order             Diet regular Room service appropriate? Yes with Assist; Fluid consistency: Thin  Diet effective now           Diet - low sodium heart healthy                   EDUCATION NEEDS:   Education needs have been addressed  Skin:  Skin Assessment: Reviewed RN Assessment Skin Integrity Issues:: Incisions Incisions: Abdomen  Last BM:  10/7 -ileostomy  Height:   Ht Readings from Last 1 Encounters:  01/05/23 5\' 4"  (1.626 m)    Weight:   Wt Readings from Last 1 Encounters:  01/15/23 51.4 kg  BMI:  Body mass index is 19.45 kg/m.  Estimated Nutritional Needs:   Kcal:  1600-1800  Protein:  85-95 grams  Fluid:  1.6L/day  Tilda Franco, MS, RD, LDN Inpatient Clinical Dietitian Contact information available via Amion

## 2023-01-15 NOTE — Progress Notes (Signed)
Physical Therapy Treatment Patient Details Name: [name] MRN: [id number] DOB: Jul 23, 1975 Today's Date: 01/15/2023   History of Present Illness Patient is a 47 year old female who presented on 9/3 with abdominal distension and nausea/vomiting. 9/4 patient was found in ED bathroom with no pulse with ROSC in about 5 mins. Patient was intubated at this time. 9/5 patient underwent exploratory laparotomy that revealed internal hernia with associated small bowel ischemia with patient undergoing bowel resection. 9/7 patient returned to OR for ostomy placement. Patient was extubated on 9/10 and also found to have PE. PMH: breast cancer, Chron's disease, IDA, recent hospitalization 8/10 to 8/15 with bowel obstruction and EGD    PT Comments  Pt progressing well with her mobility.  Per chart review, plans are for LTAC.  Pt is hoping to be able to go home.   Assisted OOB to amb to bathroom.  General bed mobility comments: pt self able with use of bed rail. General transfer comment: pt able to self rise at [employer]. Also assisted with a toilet transfer.  Pt able to self perform peri care in standing. General Gait Details: tolerated amb to bathroom then in hallway a functional distance.  HR increased to 146.  Pt asymptomatic.  HgB is 7.1 today. Assisted back to bed sitting EOB.   RN called to room to look at Pt's tongue as she c/o "everything" taste bad.  Tongue is coated with white/yellow layer that even with rinse/brushing does not come off.     If plan is discharge home, recommend the following: A lot of help with walking and/or transfers;A lot of help with bathing/dressing/bathroom;Assistance with cooking/housework;Assist for transportation   Can travel by private vehicle     No  Equipment Recommendations  Rolling walker (2 wheels)    Recommendations for Other Services       Precautions / Restrictions Precautions Precautions: Fall Precaution Comments: ABD  precautions, ostomy, nasal tube Restrictions Weight Bearing Restrictions: No     Mobility  Bed Mobility Overal bed mobility: Needs Assistance Bed Mobility: Supine to Sit Rolling: Supervision, Contact guard assist         General bed mobility comments: pt self able with use of bed rail.    Transfers Overall transfer level: Needs assistance Equipment used: Rolling walker (2 wheels) Transfers: Sit to/from Stand Sit to Stand: Contact guard assist, Supervision           General transfer comment: pt able to self rise at [employer]. Also assisted with a toilet transfer.  Pt able to self perform peri care in standing.    Ambulation/Gait Ambulation/Gait assistance: Supervision, Contact guard assist Gait Distance (Feet): 65 Feet Assistive device: Rolling walker (2 wheels) Gait Pattern/deviations: Step-through pattern, Decreased stride length, Narrow base of support Gait velocity: decr     General Gait Details: tolerated amb to bathroom then in hallway a functional distance.  HR increased to 146.  Pt asymptomatic.  HgB is 7.1 today.   Stairs             Wheelchair Mobility     Tilt Bed    Modified Rankin (Stroke Patients Only)       Balance                                            Cognition Arousal: Alert   Overall Cognitive  Status: Within Functional Limits for tasks assessed                                 General Comments: AxO x 3 pleasant and willing.  Voice is getting stronger. Pt wants to D/C to home.        Exercises      General Comments        Pertinent Vitals/Pain Pain Assessment Pain Assessment: Faces Faces Pain Scale: Hurts a little bit Pain Location: abdomen RLQ near drain site Pain Descriptors / Indicators: Grimacing, Discomfort Pain Intervention(s): Monitored during session, Premedicated before session, Repositioned, Ice applied    Home Living                           Prior Function            PT Goals (current goals can now be found in the care plan section) Progress towards PT goals: Progressing toward goals    Frequency    Min 1X/week      PT Plan      Co-evaluation              AM-PAC PT "6 Clicks" Mobility   Outcome Measure  Help needed turning from your back to your side while in a flat bed without using bedrails?: A Little Help needed moving from lying on your back to sitting on the side of a flat bed without using bedrails?: A Little Help needed moving to and from a bed to a chair (including a wheelchair)?: A Little Help needed standing up from a chair using your arms (e.g., wheelchair or bedside chair)?: A Little Help needed to walk in hospital room?: A Little Help needed climbing 3-5 steps with a railing? : A Little 6 Click Score: 18    End of Session Equipment Utilized During Treatment: Gait belt Activity Tolerance: Patient tolerated treatment well Patient left: in bed;with call bell/phone within reach Nurse Communication: Mobility status PT Visit Diagnosis: Unsteadiness on feet (R26.81);Muscle weakness (generalized) (M62.81);Difficulty in walking, not elsewhere classified (R26.2)     Time: 8413-2440 PT Time Calculation (min) (ACUTE ONLY): 23 min  Charges:    $Gait Training: 8-22 mins $Therapeutic Activity: 8-22 mins PT General Charges $$ ACUTE PT VISIT: 1 Visit                     Felecia Shelling  PTA Acute  Rehabilitation Services Office M-F          [phone]

## 2023-01-16 DIAGNOSIS — Z934 Other artificial openings of gastrointestinal tract status: Secondary | ICD-10-CM | POA: Diagnosis not present

## 2023-01-16 DIAGNOSIS — K90829 Short bowel syndrome, unspecified: Secondary | ICD-10-CM | POA: Diagnosis not present

## 2023-01-16 DIAGNOSIS — D500 Iron deficiency anemia secondary to blood loss (chronic): Secondary | ICD-10-CM | POA: Diagnosis not present

## 2023-01-16 LAB — TYPE AND SCREEN
ABO/RH(D): B POS
Antibody Screen: NEGATIVE
Unit division: 0

## 2023-01-16 LAB — CBC WITH DIFFERENTIAL/PLATELET
Abs Immature Granulocytes: 0.37 10*3/uL — ABNORMAL HIGH (ref 0.00–0.07)
Basophils Absolute: 0.1 10*3/uL (ref 0.0–0.1)
Basophils Relative: 1 %
Eosinophils Absolute: 0.1 10*3/uL (ref 0.0–0.5)
Eosinophils Relative: 1 %
HCT: 26.6 % — ABNORMAL LOW (ref 36.0–46.0)
Hemoglobin: 8.5 g/dL — ABNORMAL LOW (ref 12.0–15.0)
Immature Granulocytes: 3 %
Lymphocytes Relative: 11 %
Lymphs Abs: 1.6 10*3/uL (ref 0.7–4.0)
MCH: 28.6 pg (ref 26.0–34.0)
MCHC: 32 g/dL (ref 30.0–36.0)
MCV: 89.6 fL (ref 80.0–100.0)
Monocytes Absolute: 1.8 10*3/uL — ABNORMAL HIGH (ref 0.1–1.0)
Monocytes Relative: 13 %
Neutro Abs: 10.5 10*3/uL — ABNORMAL HIGH (ref 1.7–7.7)
Neutrophils Relative %: 71 %
Platelets: 419 10*3/uL — ABNORMAL HIGH (ref 150–400)
RBC: 2.97 MIL/uL — ABNORMAL LOW (ref 3.87–5.11)
RDW: 18.1 % — ABNORMAL HIGH (ref 11.5–15.5)
WBC: 14.4 10*3/uL — ABNORMAL HIGH (ref 4.0–10.5)
nRBC: 0 % (ref 0.0–0.2)

## 2023-01-16 LAB — BPAM RBC
Blood Product Expiration Date: 202411072359
ISSUE DATE / TIME: 202410071955
Unit Type and Rh: 7300

## 2023-01-16 LAB — AEROBIC/ANAEROBIC CULTURE W GRAM STAIN (SURGICAL/DEEP WOUND): Culture: NO GROWTH

## 2023-01-16 MED ORDER — ADULT MULTIVITAMIN LIQUID CH: 15.0000 mL | Freq: Every day | ORAL | Status: DC

## 2023-01-16 MED ORDER — FERROUS SULFATE 300 (60 FE) MG/5ML PO SOLN: 300.0000 mg | Freq: Two times a day (BID) | ORAL | Status: DC

## 2023-01-16 MED ORDER — METOPROLOL TARTRATE 50 MG PO TABS: 25.0000 mg | ORAL_TABLET | Freq: Two times a day (BID) | ORAL | Status: DC

## 2023-01-16 MED ORDER — NYSTATIN 100000 UNIT/ML MT SUSP: 5.0000 mL | Freq: Four times a day (QID) | OROMUCOSAL | 0 refills | Status: DC

## 2023-01-16 MED ORDER — OPIUM 10 MG/ML (1%) PO TINC: 10.0000 mg | Freq: Four times a day (QID) | ORAL | Status: DC

## 2023-01-16 MED ORDER — KATE FARMS STANDARD 1.4 PO LIQD: 325.0000 mL | Freq: Two times a day (BID) | ORAL | Status: DC

## 2023-01-16 MED ORDER — NUTRISOURCE FIBER PO PACK
1.0000 | PACK | ORAL | Status: AC
  Administered 2023-01-16: 1
  Filled 2023-01-16: qty 1

## 2023-01-16 MED ORDER — ENOXAPARIN SODIUM 60 MG/0.6ML IJ SOSY: 1.0000 mg/kg | PREFILLED_SYRINGE | Freq: Two times a day (BID) | INTRAMUSCULAR | Status: DC

## 2023-01-16 MED ORDER — FAMOTIDINE 40 MG/5ML PO SUSR: 20.0000 mg | Freq: Every day | ORAL | Status: DC

## 2023-01-16 MED ORDER — GABAPENTIN 250 MG/5ML PO SOLN: 200.0000 mg | Freq: Three times a day (TID) | ORAL | Status: DC

## 2023-01-16 MED ORDER — CIPROFLOXACIN IN D5W 400 MG/200ML IV SOLN: 400.0000 mg | Freq: Two times a day (BID) | INTRAVENOUS | Status: DC

## 2023-01-16 MED ORDER — FAMOTIDINE 40 MG/5ML PO SUSR
20.0000 mg | Freq: Every day | ORAL | Status: DC
  Administered 2023-01-16: 20 mg
  Filled 2023-01-16: qty 2.5

## 2023-01-16 MED ORDER — METOPROLOL TARTRATE 50 MG PO TABS: 50.0000 mg | ORAL_TABLET | Freq: Two times a day (BID) | ORAL | Status: DC

## 2023-01-16 MED ORDER — METRONIDAZOLE 500 MG/100ML IV SOLN: 500.0000 mg | Freq: Two times a day (BID) | INTRAVENOUS | Status: DC

## 2023-01-16 MED ORDER — DIPHENOXYLATE-ATROPINE 2.5-0.025 MG PO TABS
2.0000 | ORAL_TABLET | Freq: Four times a day (QID) | ORAL | Status: DC
  Administered 2023-01-16: 2 via ORAL
  Filled 2023-01-16: qty 2

## 2023-01-16 MED ORDER — NUTRISOURCE FIBER PO PACK
1.0000 | PACK | Freq: Three times a day (TID) | ORAL | Status: DC
  Filled 2023-01-16: qty 1

## 2023-01-16 MED ORDER — TRAVASOL 10 % IV SOLN
INTRAVENOUS | Status: DC
  Filled 2023-01-16: qty 940.8

## 2023-01-16 MED ORDER — LOPERAMIDE HCL 1 MG/7.5ML PO SUSP
4.0000 mg | Freq: Four times a day (QID) | ORAL | Status: DC
  Administered 2023-01-16 (×2): 4 mg
  Filled 2023-01-16 (×3): qty 30

## 2023-01-16 MED ORDER — LOPERAMIDE HCL 1 MG/7.5ML PO SUSP
4.0000 mg | Freq: Four times a day (QID) | ORAL | Status: DC
  Filled 2023-01-16: qty 30

## 2023-01-16 NOTE — Discharge Instructions (Addendum)
[facility]      [facility], [place] [phone]  OPEN ABDOMINAL SURGERY: POST OP INSTRUCTIONS  Always review your discharge instruction sheet given to you by the facility where your surgery was performed.  IF YOU HAVE DISABILITY OR FAMILY LEAVE FORMS, YOU MUST BRING THEM TO THE OFFICE FOR PROCESSING.  PLEASE DO NOT GIVE THEM TO YOUR DOCTOR.  A prescription for pain medication may be given to you upon discharge.  Take your pain medication as prescribed, if needed.  If narcotic pain medicine is not needed, then you may take acetaminophen (Tylenol) or ibuprofen (Advil) as needed. Take your usually prescribed medications unless otherwise directed. If you need a refill on your pain medication, please contact your pharmacy. They will contact our office to request authorization.  Prescriptions will not be filled after 5pm or on week-ends. You should follow a light diet the first few days after arrival home, such as soup and crackers, pudding, etc.unless your doctor has advised otherwise. A high-fiber, low fat diet can be resumed as tolerated.   Be sure to include lots of fluids daily. Most patients will experience some swelling and bruising on the chest and neck area.  Ice packs will help.  Swelling and bruising can take several days to resolve Most patients will experience some swelling and bruising in the area of the incision. Ice pack will help. Swelling and bruising can take several days to resolve..  It is common to experience some constipation if taking pain medication after surgery.  Increasing fluid intake and taking a stool softener will usually help or prevent this problem from occurring.  A mild laxative (Milk of Magnesia or Miralax) should be taken according to package directions if there are no bowel movements after 48 hours.  You may have steri-strips (small skin tapes) in place directly over the incision.  These strips should be left on the skin for 7-10 days.  If your surgeon used skin  glue on the incision, you may shower in 24 hours.  The glue will flake off over the next 2-3 weeks.  Any sutures or staples will be removed at the office during your follow-up visit. You may find that a light gauze bandage over your incision may keep your staples from being rubbed or pulled. You may shower and replace the bandage daily. ACTIVITIES:  You may resume regular (light) daily activities beginning the next day--such as daily self-care, walking, climbing stairs--gradually increasing activities as tolerated.  You may have sexual intercourse when it is comfortable.  Refrain from any heavy lifting or straining until approved by your doctor. You may drive when you no longer are taking prescription pain medication, you can comfortably wear a seatbelt, and you can safely maneuver your car and apply brakes Return to Work: ___________________________________ [name] should see your doctor in the office for a follow-up appointment approximately two weeks after your surgery.  Make sure that you call for this appointment within a day or two after you arrive home to insure a convenient appointment time. OTHER INSTRUCTIONS:  _____________________________________________________________ _____________________________________________________________  WHEN TO CALL YOUR DOCTOR: Fever over 101.0 Inability to urinate Nausea and/or vomiting Extreme swelling or bruising Continued bleeding from incision. Increased pain, redness, or drainage from the incision. Difficulty swallowing or breathing Muscle cramping or spasms. Numbness or tingling in hands or feet or around lips.  The clinic staff is available to answer your questions during regular business hours.  Please don't hesitate to call and ask to speak to one of  the nurses if you have concerns.  For further questions, please visit [email or web address]   Short bowel syndrome Short bowel syndrome is a set of symptoms that happen while your  remaining bowel adapts after your surgery. You can reduce these symptoms by following the guidelines in this resource. People with short bowel syndrome may have:  Gas Cramps Diarrhea (loose or watery stools) Fluid Loss Weight loss  Dietary guidelines Follow these guidelines while your bowel recovers.  Eat 6 to 8 small meals a day Eat small, frequent meals to put less stress on your shortened bowel. Small meals help control your symptoms and are easier for your body to digest and absorb. Eat slowly and chew your food well. Once your bowel has adapted, you can go back to having 3 meals a day.  Chew foods well Chew foods well to help break down food. This makes it easier for your body to absorb. It will also help stop foods from causing a blockage as they pass through your intestine.  Only drink  cup (4 ounces) of liquids during each meal Drink large amounts of liquids with meals. This helps push your food through your bowel more quickly. This means that you may not digest or absorb enough nutrients. Drink most of your liquids between meals, at least 1 hour before or after a meal.  Include enough nutrients in your meals to help you heal. Your meals should be:  High in proteins. Examples of protein-rich foods include: Fish Eggs Tofu Poultry, such as chicken and [place] Meat, such asbeef, pork, and lamb Dairy products, such as milk, cheese, and yogurt Smooth peanut butter and other nut butters, such as almond butter  High in refined or low-fiber complex carbohydrates (starches). Examples include: White bread Cereals such as Rice Krispies and corn flakes Potatoes without skin White rice White pastas  Moderate in fats. Examples of fatty foods are: Oils Butter Margarine Mayonnaise Gravies Cream sauces Gravies Cream sauces Regular salad dressings For example, it's okay to have butter on toast or mayonnaise on a sandwich. But, it's better to avoid very high-fat foods, such as  deep fried foods.If a large section of your ileum was removed, you may tolerate fats better at breakfast than later in the day.  Low in sugary foods. Examples of sugary foods are: Sugar (cookies, cakes, candies, chocolate, soda, instant teas, fruit drinks) Corn syrup Molasses Honey Pancake syrup You can use artificial sweeteners like Splenda or Sweet N' Low. However, limit your intake of sugar-free candies or cough drops that contain sugar alcohols. Sugar alcohols include sorbitol, xylitol, mannitol, and isomalt. Having large amounts of these may have a laxative effect (make you have a bowel movement). Include enough liquids in your diet  Drink at least 8 (8-ounce) glasses of liquids each day. Avoid very hot or vey cold drinks. Choose drinks that don't have a lot of sugar. This will keep you from getting dehydrated. Examples include water, coffee, tea, milk, or juices diluted with water. Be careful, coffee may have a laxative effect on some people.   Fiber After your surgery, you may find that fiber, especially insoluble fiber, is hard to digest.  Insoluble fiber is found mainly in whole-grain and bran products. It doesn't break down in water and your body can't break it down, so it makes stool (poop) more bulky.    Soluble fiber breaks down in water and can be broken down by your body. It also helps slow digestion. This usually makes it easier to  tolerate.   Foods with soluble fiber include:  Oatmeal Oat bran Barley Soy Nut butters Fruit Fruit Pectin Legumes, such as chickpeas, lima beans, kidney beans, and lentils Psyllium (fiber supplement).       Interventional Radiology Percutaneous Abscess Drain Placement After Care   This sheet gives you information about how to care for yourself after your procedure. Your health care provider may also give you more specific instructions. Your drain was placed by an interventional radiologist with [facility]. If you have  questions or concerns, contact [facility] at [phone].   What is a percutaneous drain?   A drain is a small plastic tube (catheter) that goes into the fluid collection in your body through your skin.   How long will I need the drain?   How long the drain needs to stay in is determined by where the drain is, how much comes out of the drain each day and if you are having any other surgical procedures.   Interventional radiology will determine when it is time to remove the drain. It is important to follow up as directed so that the drain can be removed as soon as it is safe to do so.   What can I expect after the procedure?   After the procedure, it is common to have:   A small amount of bruising and discomfort in the area where the drainage tube (catheter) was placed.   Sleepiness and fatigue. This should go away after the medicines you were given have worn off.   Follow these instructions at home:   Insertion site care   Check your insertion site when you change the bandage. Check for:   More redness, swelling, or pain.   More fluid or blood.   Warmth.   Pus or a bad smell.   When caring for your insertion site:   Wash your hands with soap and water for at least 20 seconds before and after you change your bandage (dressing). If soap and water are not available, use hand sanitizer.   You do not need to change your dressing everyday if it is clean and dry. Change your dressing every 3 days or as needed when it is soiled, wet or becoming dislodged. You will need to change your dressing each time you shower.   Leave stitches (sutures), skin glue, or adhesive strips in place. These skin closures may need to stay in place for 2 weeks or longer. If adhesive strip edges start to loosen and curl up, you may trim the loose edges. Do not remove adhesive strips completely unless your health care provider tells you to do so.   Catheter care   Flush the catheter once per  day with 5 mL of 0.9% normal saline unless you are told otherwise by your healthcare provider. This helps to prevent clogs in the catheter.   To disconnect the drain, turn the clear plastic tube to the left. Attach the saline syringe by placing it on the white end of the drain and turning gently to the right. Once attached gently push the plunger to the 5 mL mark. After you are done flushing, disconnect the syringe by turning to the left and reattach your drainage container   If you have a bulb please be sure the bulb is charged after reconnecting it - to do this pinch the bulb between your thumb and first finger and close the stopper located on the top of the bulb.    Check  for fluid leaking from around your catheter (instead of fluid draining through your catheter). This may be a sign that the drain is no longer working correctly.   Write down the following information every time you empty your bag:   The date and time.   The amount of drainage.   Activity   Rest at home for 1-2 days after your procedure.   For the first 48 hours do not lift anything more than 10 lbs (about a gallon of milk). You may perform moderate activities/exercise. Please avoid strenuous activities during this time.   Avoid any activities which may pull on your drain as this can cause your drain to become dislodged.   If you were given a sedative during the procedure, it can affect you for several hours. Do not drive or operate machinery until your health care provider says that it is safe.   General instructions   For mild pain take over-the-counter medications as needed for pain such as Tylenol or Advil. If you are experiencing severe pain please call our office as this may indicate an issue with your drain.    If you were prescribed an antibiotic medicine, take it as told by your health care provider. Do not stop using the antibiotic even if you start to feel better.   You may shower 24 hours after the drain  is placed. To do this cover the insertion site with a water tight material such as saran wrap and seal the edges with tape, you may also purchase waterproof dressings at your local drug store. Shower as usual and then remove the water tight dressing and any gauze/tape underneath it once you have exited the shower and dried off. Allow the area to air dry or pat dry with a clean towel. Once the skin is completely dry place a new gauze dressing. It is important to keep the site dry at all times to prevent infection.   Do not submerge the drain - this means you cannot take baths, swim, use a hot tub, etc. until the drain is removed.    Do not use any products that contain nicotine or tobacco, such as cigarettes, e-cigarettes, and chewing tobacco. If you need help quitting, ask your health care provider.   Keep all follow-up visits as told by your health care provider. This is important.   Contact a health care provider if:   You have less than 10 mL of drainage a day for 2-3 days in a row, or as directed by your health care provider.   You have any of these signs of infection:   More redness, swelling, or pain around your incision area.   More fluid or blood coming from your incision area.   Warmth coming from your incision area.   Pus or a bad smell coming from your incision area.   You have fluid leaking from around your catheter (instead of through your catheter).   You are unable to flush the drain.   You have a fever or chills.   You have pain that does not get better with medicine.   You have not been contacted to schedule a drain follow up appointment within 10 days of discharge from the hospital.   Please call [facility] at [phone] with any questions or concerns.   Get help right away if:   Your catheter comes out.   You suddenly stop having drainage from your catheter.   You suddenly have blood in the  fluid that is draining from your catheter.   You  become dizzy or you faint.   You develop a rash.   You have nausea or vomiting.   You have difficulty breathing or you feel short of breath.   You develop chest pain.   You have problems with your speech or vision.   You have trouble balancing or moving your arms or legs.   Summary   It is common to have a small amount of bruising and discomfort in the area where the drainage tube (catheter) was placed. You may also have minor discomfort with movement while the drain is in place.   Flush the drain once per day with 5 mL of 0.9% normal saline (unless you were told otherwise by your healthcare provider).    Record the amount of drainage from the bag every time you empty it.   Change the dressing every 3 days or earlier if soiled/wet. Keep the skin dry under the dressing.   You may shower with the drain in place. Do not submerge the drain (no baths, swimming, hot tubs, etc.).   Contact [facility] at [phone] if you have more redness, swelling, or pain around your incision area or if you have pain that does not get better with medicine.   This information is not intended to replace advice given to you by your health care provider. Make sure you discuss any questions you have with your health care provider.   Document Revised: 06/30/2021 Document Reviewed: 03/22/2019   [employer] Patient Education  2023 [employer]         Interventional Radiology Drain Record   Empty your drain at least once per day. You may empty it as often as needed. Use this form to write down the amount of fluid that has collected in the drainage container. Bring this form with you to your follow-up visits. Please call [facility] at [phone] with any questions or concerns prior to your appointment.   Drain #1 location: ___________________   Date __________ Time __________ Amount __________   Date __________ Time __________ Amount __________   Date __________ Time  __________ Amount __________   Date __________ Time __________ Amount __________   Date __________ Time __________ Amount __________   Date __________ Time __________ Amount __________   Date __________ Time __________ Amount __________   Date __________ Time __________ Amount __________   Date __________ Time __________ Amount __________   Date __________ Time __________ Amount __________   Date __________ Time __________ Amount __________   Date __________ Time __________ Amount __________   Date __________ Time __________ Amount __________   Date __________ Time __________ Amount __________

## 2023-01-16 NOTE — Discharge Summary (Signed)
Summary: No evidence of deep vein or superficial vein thrombosis involving the right and left upper extremities. Right: However, unable to visualize the subclavian vein.  *See table(s) above for measurements and observations.  Diagnosing physician: Coral Else MD Electronically signed by Coral Else MD on 12/30/2022 at 10:08:06 AM.    Final    DG CHEST PORT 1 VIEW  Result Date: 12/27/2022 CLINICAL DATA:  Fever EXAM: PORTABLE CHEST 1 VIEW COMPARISON:  12/22/2022 FINDINGS: Enteric tube extends below the diaphragm with distal tip terminating within the third portion of the duodenum. Left chest port and right subclavian central venous catheter remain appropriately positioned. Normal heart size. Left greater than right bibasilar opacities with bilateral pleural effusions, slightly improved. No pneumothorax. IMPRESSION: Left greater than right bibasilar opacities with bilateral pleural effusions, slightly improved. Electronically Signed   By: Duanne Guess D.O.   On: 12/27/2022 13:43   VAS [place] LOWER EXTREMITY VENOUS  (DVT)  Result Date: 12/27/2022  Lower Venous DVT Study Patient Name:  [name]  Date of Exam:   12/27/2022 Medical Rec #: [id number]         Accession #:    [id number] Date of Birth: 1975/11/15         Patient Gender: F Patient Age:   47 years Exam Location:  [facility] Procedure:      VAS [place] LOWER EXTREMITY VENOUS (DVT) Referring Phys: MICHAEL MACZIS --------------------------------------------------------------------------------  Indications: Pulmonary embolism.  Risk Factors: Confirmed PE. Limitations: Poor ultrasound/tissue interface, bandages, open wound and patient positioning. Comparison Study: 12/14/2022, 12/20/2022 - Negative for DVT. Performing Technologist: Chanda Busing RVT  Examination Guidelines: A complete evaluation includes B-mode imaging, spectral Doppler, color Doppler, and power Doppler as needed of all accessible portions of each vessel. Bilateral testing is considered an integral part of a complete examination. Limited examinations for reoccurring indications may be performed as noted. The reflux portion of the exam is performed with the patient in reverse Trendelenburg.  +---------+---------------+---------+-----------+----------+--------------+ RIGHT    CompressibilityPhasicitySpontaneityPropertiesThrombus Aging +---------+---------------+---------+-----------+----------+--------------+ CFV      Full           Yes      Yes                                 +---------+---------------+---------+-----------+----------+--------------+ SFJ      Full                                                        +---------+---------------+---------+-----------+----------+--------------+ FV Prox  Full                                                        +---------+---------------+---------+-----------+----------+--------------+ FV Mid                  Yes      Yes                                  +---------+---------------+---------+-----------+----------+--------------+ FV DistalFull                                                        +---------+---------------+---------+-----------+----------+--------------+  CORNETT , who verbally acknowledged these results. Electronically Signed   By: Marin Roberts M.D.   On: 12/31/2022 11:19   VAS [place] UPPER EXTREMITY VENOUS DUPLEX  Result Date: 12/30/2022 UPPER VENOUS STUDY  Patient Name:  [name]  Date of Exam:   12/28/2022 Medical Rec #: [id number]         Accession #:    [id number] Date of Birth: 04-22-1975         Patient Gender: F Patient Age:   47 years Exam Location:  [facility] Procedure:      VAS [place] UPPER EXTREMITY VENOUS DUPLEX Referring Phys: JEFFREY HATCHER --------------------------------------------------------------------------------  Indications: PE not on anticoagulation Limitations: Line and port placement. Comparison Study: No previous UE exams Performing Technologist: Jody Hill RVT, RDMS  Examination Guidelines: A complete evaluation includes B-mode imaging, spectral Doppler, color Doppler, and power Doppler as needed of all accessible portions of each vessel. Bilateral testing is considered an integral part of a complete examination. Limited examinations for reoccurring indications may be performed as noted.  Right Findings: +----------+------------+---------+-----------+----------+--------------+ RIGHT     CompressiblePhasicitySpontaneousProperties   Summary     +----------+------------+---------+-----------+----------+--------------+ IJV           Full       Yes       Yes                             +----------+------------+---------+-----------+----------+--------------+ Subclavian                                          Not visualized  +----------+------------+---------+-----------+----------+--------------+ Axillary      Full       Yes       Yes                             +----------+------------+---------+-----------+----------+--------------+ Brachial      Full       Yes       Yes                             +----------+------------+---------+-----------+----------+--------------+ Radial        Full                                                 +----------+------------+---------+-----------+----------+--------------+ Ulnar         Full                                                 +----------+------------+---------+-----------+----------+--------------+ Cephalic      Full                                                 +----------+------------+---------+-----------+----------+--------------+ Basilic       Full       Yes       Yes                             +----------+------------+---------+-----------+----------+--------------+  positioned at the superior caval-atrial junction. Final catheter positioning was confirmed and documented with a spot radiographic image. The catheter aspirates and flushes normally. The catheter was flushed with appropriate volume heparin dwells. The catheter exit site was secured with a 2-0 Ethilon retention suture. The venotomy incision was closed with Dermabond. Dressings were applied. The patient tolerated the procedure well without immediate post procedural complication. FINDINGS: After catheter placement, the tip lies within the superior apect of the right atrium. The catheter aspirates and flushes normally and is ready for immediate use. IMPRESSION: Successful placement of a 20 cm dual lumen tunneled central venous "Powerline" catheter via the  RIGHT internal jugular vein The tip of the catheter is positioned within the proximal RIGHT atrium. The catheter is ready for immediate use. Roanna Banning, MD [facility] Radiology Electronically Signed   By: Roanna Banning M.D.   On: 01/09/2023 16:16   [place] EKG SITE RITE  Result Date: 01/08/2023 If Site Rite image not attached, placement could not be confirmed due to current cardiac rhythm.  DG CHEST PORT 1 VIEW  Result Date: 01/03/2023 CLINICAL DATA:  10031 Cough 786.2.ICD-9-CM E6361829 Fever 161096 EXAM: PORTABLE CHEST 1 VIEW COMPARISON:  None Available. FINDINGS: The heart size and mediastinal contours are within normal limits. Left IJ approach Port-A-Cath with the tip projecting near the superior cavoatrial junction. Right subclavian approach central venous catheter with its tip near the superior cavoatrial junction also. Enteric tube courses below the diaphragm with the tip in the third portion of the duodenum. Improved aeration of the lung bases within minimal streaky left basilar opacities. No visible pleural effusions or pneumothorax. No acute osseous abnormality. Left breast implant. Improved aeration lung IMPRESSION: Bases with minimal streaky left basilar opacities. Electronically Signed   By: Feliberto Harts M.D.   On: 01/03/2023 14:31   DG Swallowing Func-Speech Pathology  Result Date: 01/02/2023 Table formatting from the original result was not included. Modified Barium Swallow Study Patient Details Name: [name] MRN: [id number] Date of Birth: 1975-12-17 Today's Date: 12/29/2022 HPI/PMH: HPI: Per CCM note "47 year old female with past medical history of recurrent breast cancer not currently on treatment, Crohn's disease s/p subtotal colectomy in 2021 recurrent obstructions, colonic stricture, IDA, recent hospitalization 8/10-8/15/2024 for obstruction seen by GI and general surgery. Underwent EGD and flexible sigmoidoscopy and discharged on  steroids who presented to the emergency department on 12/12/2022 with abdominal pain, nausea, vomiting diarrhea. In the ED tachycardic, hypertensive. ED provider notes abdominal distention. She was given 2L IVF, morphine, IV PPI. Labs with WBC 10.8, Hgb 10.7, K 3.2. CT AP was ordered and showed markedly dilated distal small bowel consistent with chronic obstruction. No changes since prior study. Patient was admitted to [facility]. They consulted GI for input. Unfortunately, around 0820 9/4 patient was found on the floor in the bathroom in ED pulseless. They estimate downtime around 5 minutes. ACLS was initiated. Concern for possible torsades de pointes and given IV mag, defibrillated. Coded for around 5 minutes. She was intubated. She had copious vomitus in the ETT. ROSC achieved and ICU was consulted. Notably, she had I-stat peri-code which showed hemoglobin of 5.5 down from 10 overnight."  Pt was extubated 12/18/2021.  Pt has been maintained on TPN and is now getting trickle feeds for nutrition. SLP following for dysphagia management. Clinical Impression: Clinical Impression: Today patient presents with improvement in swallow function compared to prior approx six days ago.  Mild pharyngeal dysphagia remains with inconsistent penetration of  PTV      Full                                                        +---------+---------------+---------+-----------+----------+--------------+ PERO     Full                                                        +---------+---------------+---------+-----------+----------+--------------+     Summary: RIGHT: - There is no evidence of deep vein thrombosis in the lower extremity. However, portions of this examination were limited- see technologist comments above.  - No cystic structure found in the popliteal fossa.  LEFT: - There is no evidence of deep vein thrombosis in the lower extremity. However, portions of this examination were limited- see technologist comments above.  - No cystic structure found in the popliteal fossa.  *See table(s) above for measurements and observations. Electronically signed by Gerarda Fraction on 12/21/2022 at 2:58:08 PM.    Final     ECHOCARDIOGRAM LIMITED  Result Date: 12/20/2022    ECHOCARDIOGRAM LIMITED REPORT   Patient Name:   [name] Date of Exam: 12/20/2022 Medical Rec #:  [id number]        Height:       64.0 in Accession #:    [id number]       Weight:       140.4 lb Date of Birth:  1976/03/18        BSA:          1.683 m Patient Age:    47 years         BP:           170/111 mmHg Patient Gender: F                HR:           120 bpm. Exam Location:  Inpatient Procedure: Cardiac Doppler, Limited Echo and Limited Color Doppler Indications:    Pulmonary Embolus I26.09  History:        Patient has prior history of Echocardiogram examinations, most                 recent 12/15/2022. Breast Cancer, Arrythmias:Cardiac Arrest; Risk                 Factors:Former Smoker.  Sonographer:    Aron Baba Referring Phys: [id number] Cristopher Peru  Sonographer Comments: No apical window and suboptimal subcostal window. Image acquisition challenging due to breast implants. IMPRESSIONS  1. Left ventricular ejection fraction, by estimation, is 60 to 65%. The left ventricle has normal function.  2. Right ventricular systolic function is normal. The right ventricular size is normal. Tricuspid regurgitation signal is inadequate for assessing PA pressure.  3. Aortic valve regurgitation is not visualized.  4. The inferior vena cava is dilated in size with >50% respiratory variability, suggesting right atrial pressure of 8 mmHg. Conclusion(s)/Recommendation(s): No RV strain, no pulmonary hypertension. FINDINGS  Left Ventricle: Left ventricular ejection fraction, by estimation, is 60 to 65%. The left ventricle has normal function. There is no left ventricular hypertrophy. Right Ventricle: The right ventricular size is normal. Right ventricular systolic  PTV      Full                                                        +---------+---------------+---------+-----------+----------+--------------+ PERO     Full                                                        +---------+---------------+---------+-----------+----------+--------------+     Summary: RIGHT: - There is no evidence of deep vein thrombosis in the lower extremity. However, portions of this examination were limited- see technologist comments above.  - No cystic structure found in the popliteal fossa.  LEFT: - There is no evidence of deep vein thrombosis in the lower extremity. However, portions of this examination were limited- see technologist comments above.  - No cystic structure found in the popliteal fossa.  *See table(s) above for measurements and observations. Electronically signed by Gerarda Fraction on 12/21/2022 at 2:58:08 PM.    Final     ECHOCARDIOGRAM LIMITED  Result Date: 12/20/2022    ECHOCARDIOGRAM LIMITED REPORT   Patient Name:   [name] Date of Exam: 12/20/2022 Medical Rec #:  [id number]        Height:       64.0 in Accession #:    [id number]       Weight:       140.4 lb Date of Birth:  1976/03/18        BSA:          1.683 m Patient Age:    47 years         BP:           170/111 mmHg Patient Gender: F                HR:           120 bpm. Exam Location:  Inpatient Procedure: Cardiac Doppler, Limited Echo and Limited Color Doppler Indications:    Pulmonary Embolus I26.09  History:        Patient has prior history of Echocardiogram examinations, most                 recent 12/15/2022. Breast Cancer, Arrythmias:Cardiac Arrest; Risk                 Factors:Former Smoker.  Sonographer:    Aron Baba Referring Phys: [id number] Cristopher Peru  Sonographer Comments: No apical window and suboptimal subcostal window. Image acquisition challenging due to breast implants. IMPRESSIONS  1. Left ventricular ejection fraction, by estimation, is 60 to 65%. The left ventricle has normal function.  2. Right ventricular systolic function is normal. The right ventricular size is normal. Tricuspid regurgitation signal is inadequate for assessing PA pressure.  3. Aortic valve regurgitation is not visualized.  4. The inferior vena cava is dilated in size with >50% respiratory variability, suggesting right atrial pressure of 8 mmHg. Conclusion(s)/Recommendation(s): No RV strain, no pulmonary hypertension. FINDINGS  Left Ventricle: Left ventricular ejection fraction, by estimation, is 60 to 65%. The left ventricle has normal function. There is no left ventricular hypertrophy. Right Ventricle: The right ventricular size is normal. Right ventricular systolic  Physician Discharge Summary   Patient: [name] MRN: [id number] DOB: Nov 21, 1975  Admit date:     12/12/2022  Discharge date: 01/16/23  Discharge Physician: Kathlen Mody   PCP: Ollen Bowl, MD   Recommendations at discharge:  Please follow up with ID as scheduled.  Please follow up with General surgery, PT AND SLP at [facility].  Recommend to continue with TPN.  sterile water SOLN 40.9 mL with Travasol 10 % SOLN 94.08 g, dextrose 70 % SOLN 235.2 g, sodium ACETATE 2 MEQ/ML SOLN 46.3 mEq, sodium chloride 4 MEQ/ML SOLN 121.7 mEq, potassium ACETATE 2 MEQ/ML SOLN 30.24 mEq, potassium PHOSPHATE 3 mmol/mL SOLN 25.2 mmol, calcium GLUCONATE INJ 8.4 mEq, magnesium SULFATE INJ 16.8 mEq, M.V.I. 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   Port-A-Cath in place   Breast cancer (HCC)   Leukocytosis   Protein-calorie malnutrition, severe (HCC)   Failure to thrive in adult   Anorexia   Cardiac arrest (HCC)   Septic shock (HCC)   SBO with strangulation s/p resection 12/14/2022   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   Malnutrition of moderate degree   Palliative care by specialist   Abdominal pain   Fever, unspecified   Acute pulmonary embolism without acute cor pulmonale (HCC)  Resolved Problems:   Exacerbation of Crohn's disease of large intestine (HCC)   History of Crohn's disease   Colonic stricture (HCC)   Small bowel obstruction (HCC)   SBO (small bowel obstruction) (HCC)   Non-traumatic compartment syndrome of abdomen   Malnutrition of moderate degree    History of resection of small bowel   Crohn's disease of small intestine with intestinal obstruction [facility])  Hospital Course: [name] is a 47 y.o. female with PMH significant for Crohn's disease with previous bowel obstruction s/p subtotal colectomy 2021, colonic stricture, IDA, breast cancer s/p mastectomy 2022 currently not on treatment. Recently hospitalized 8/10-8/15/2024 for bowel obstruction seen by GI and general surgery. Underwent EGD and flexible sigmoidoscopy and discharged on steroids.   9/3, patient presented to the ED with abdominal distention, pain, nausea, vomiting CT abd/pelvis showed markedly dilated distal small bowel consistent with chronic obstruction. No changes since prior study.  Patient was admitted to [facility] with a plan of GI consultation.   9/4, in the morning, within few hours of admission, patient was found pulseless on the floor in the bathroom in ED, estimated downtime about 5 minutes. ACLS was initiated. Concern for possible torsades de pointes and given IV mag, defibrillated.  ROSC achieved in about 5 minutes. She was intubated. She had copious vomitus in the ETT.  Notably, she had I-stat peri-code which showed hemoglobin of 5.5 down from 10 overnight.  Admitted to ICU, underwent 3 units of PRBC and 3 units of FFP. She was started on vasopressors.    9/5, underwent exploratory laparotomy.  Noted to have internal hernia with associated small bowel ischemia.  Underwent bowel resection. 9/7, back to OR for ostomy placement 9/8, weaned off Levophed 9/10, extubated 9/10, found to have PE in CT angio chest.  Skin: No rashes,  Psychiatry: Mood & affect appropriate.    Condition at discharge:  fair  The results of significant diagnostics from this hospitalization (including imaging, microbiology, ancillary and laboratory) are listed below for reference.   Imaging Studies: DG Swallowing Func-Speech Pathology  Result Date: 01/12/2023 Table formatting from the original result was not included. Modified Barium Swallow Study Patient Details Name: [name] MRN: [id number] Date of Birth: [birth year] Today's Date: 01/12/2023 HPI/PMH: HPI: Per CCM note "47 year old female with past medical history of recurrent breast cancer not currently on treatment, Crohn's disease s/p subtotal colectomy in 2021 recurrent obstructions, colonic stricture, IDA, recent hospitalization 8/10-8/15/2024 for obstruction seen by GI and general surgery. Underwent EGD and flexible sigmoidoscopy and discharged on steroids who presented to the emergency department on 12/12/2022 with abdominal pain, nausea, vomiting diarrhea. In the ED tachycardic, hypertensive. ED provider notes abdominal distention. She was given 2L IVF, morphine, IV PPI. Labs with WBC 10.8, Hgb 10.7, K 3.2. CT AP was ordered and showed markedly dilated distal small bowel consistent with chronic obstruction. 12/13/2022 patient was found on the floor in the bathroom in ED pulseless. They estimate downtime around 5 minutes. ACLS was initiated. Concern for possible torsades de pointes and given IV mag, defibrillated. Coded for around 5 minutes. She was intubated. She had copious vomitus in the ETT. ROSC achieved and ICU was consulted. Notably, she had I-stat peri-code which showed hemoglobin of 5.5 down from 10 overnight."  Pt was extubated 12/18/2021.  Pt has been maintained on TPN and is now getting trickle feeds for nutrition. SLP following for dysphagia management.  Pt dislikes puree foods per notes on 9/23 and diet was advanced to mechanical soft on 9/24.  Intake remains compromised as pt reports feeling full due to overnight feeds - therefore tube feeding stopped  per surgery.  She is continuing to have fevers but denies worsening pain - ID on board.  Pt has remained aphonic since intubation 9/4 - and extubation on 9/10. Clinical Impression: Clinical Impression: Pt continues to make progress with swallowing function, demonstrating improvements in lingual control during bolus hold, laryngeal elevation, and epiglottic inversion. This improves her efficiency, as she has complete pharyngeal clearance, as well as her airway protection. With thin liquids in isolation, she has trace, transient penetration (PAS 2), but she did aspiration when trying to swallow the barium tablet with a liquid wash. The thin liquids were aspirated due to mistiming and decreased laryngeal vestibule closure, and aspiration was sensed but not cleared (PAS 7). Note that the barium tablet also remained in the pt's UES, making it difficult for additional thin liquid wash to enter her esophagus and additional sensed aspiration occurred. A bite of puree helpd move the tablet further into her esophagus, and she needed a sip of nectar thick liquids to move it beyond that point. Pt is not sure if she wants to advance her solid diet at this time, and she does report occasional symptoms similar to what was observed with the barium tablet. Therefore will leave on Dys 3 diet but advance to thin liquids, avoiding mixed consistencies and taking small, single sips via cup. Would continue to offer meds in puree, maybe crushing ones that are larger in size. Pt can likely take per her preference given that she was pretty sensate to difficulty on MBS and can verbalize precautions to take if she has trouble clinically. Factors that may increase risk of adverse event in presence of aspiration Rubye Oaks & [name] 2021): Factors  lung base. EXAM: PORTABLE CHEST 1 VIEW COMPARISON:  12/19/2022. FINDINGS: The heart size and mediastinal contours are stable. A left chest port and right central venous catheter terminates over the superior vena cava. Hazy opacities are noted in the mid to lower lung fields bilaterally suggesting moderate layering pleural effusions. Airspace disease is noted at the lung bases. No pneumothorax. An enteric tube terminates in the stomach. IMPRESSION: Hazy opacities in the mid to lower lung fields bilaterally likely reflecting moderate layering pleural effusions with atelectasis or infiltrate. Electronically Signed   By: Thornell Sartorius M.D.   On: 12/22/2022 04:45   VAS [place] LOWER EXTREMITY VENOUS (DVT)  Result Date: 12/21/2022  Lower Venous DVT Study Patient Name:  GAYNOR GENCO  Date of Exam:   12/20/2022 Medical Rec #: [id number]         Accession #:    [id number] Date of Birth: 1975-06-08         Patient Gender: F Patient Age:   13 years Exam Location:  [facility] Procedure:      VAS [place] LOWER EXTREMITY VENOUS (DVT) Referring Phys: Mertha Baars --------------------------------------------------------------------------------  Indications: Pulmonary embolism.  Risk Factors: Cancer. Limitations: Poor ultrasound/tissue interface, bandages and patient positioning, patient immobility. Comparison Study: 12/14/2022 - Negative for DVT. Performing Technologist: Chanda Busing RVT  Examination Guidelines: A complete evaluation includes B-mode imaging, spectral Doppler, color Doppler, and power Doppler as needed of all accessible portions of each vessel. Bilateral testing is considered an integral part of a complete examination. Limited examinations for reoccurring indications may  be performed as noted. The reflux portion of the exam is performed with the patient in reverse Trendelenburg.  +---------+---------------+---------+-----------+----------+--------------+ RIGHT    CompressibilityPhasicitySpontaneityPropertiesThrombus Aging +---------+---------------+---------+-----------+----------+--------------+ CFV      Full           Yes      Yes                                 +---------+---------------+---------+-----------+----------+--------------+ SFJ      Full                                                        +---------+---------------+---------+-----------+----------+--------------+ FV Prox  Full                                                        +---------+---------------+---------+-----------+----------+--------------+ FV Mid                  Yes      Yes                                 +---------+---------------+---------+-----------+----------+--------------+ FV Distal               Yes      Yes                                 +---------+---------------+---------+-----------+----------+--------------+ PFV      Full                                                        +---------+---------------+---------+-----------+----------+--------------+  Summary: No evidence of deep vein or superficial vein thrombosis involving the right and left upper extremities. Right: However, unable to visualize the subclavian vein.  *See table(s) above for measurements and observations.  Diagnosing physician: Coral Else MD Electronically signed by Coral Else MD on 12/30/2022 at 10:08:06 AM.    Final    DG CHEST PORT 1 VIEW  Result Date: 12/27/2022 CLINICAL DATA:  Fever EXAM: PORTABLE CHEST 1 VIEW COMPARISON:  12/22/2022 FINDINGS: Enteric tube extends below the diaphragm with distal tip terminating within the third portion of the duodenum. Left chest port and right subclavian central venous catheter remain appropriately positioned. Normal heart size. Left greater than right bibasilar opacities with bilateral pleural effusions, slightly improved. No pneumothorax. IMPRESSION: Left greater than right bibasilar opacities with bilateral pleural effusions, slightly improved. Electronically Signed   By: Duanne Guess D.O.   On: 12/27/2022 13:43   VAS [place] LOWER EXTREMITY VENOUS  (DVT)  Result Date: 12/27/2022  Lower Venous DVT Study Patient Name:  [name]  Date of Exam:   12/27/2022 Medical Rec #: [id number]         Accession #:    [id number] Date of Birth: 1975/11/15         Patient Gender: F Patient Age:   47 years Exam Location:  [facility] Procedure:      VAS [place] LOWER EXTREMITY VENOUS (DVT) Referring Phys: MICHAEL MACZIS --------------------------------------------------------------------------------  Indications: Pulmonary embolism.  Risk Factors: Confirmed PE. Limitations: Poor ultrasound/tissue interface, bandages, open wound and patient positioning. Comparison Study: 12/14/2022, 12/20/2022 - Negative for DVT. Performing Technologist: Chanda Busing RVT  Examination Guidelines: A complete evaluation includes B-mode imaging, spectral Doppler, color Doppler, and power Doppler as needed of all accessible portions of each vessel. Bilateral testing is considered an integral part of a complete examination. Limited examinations for reoccurring indications may be performed as noted. The reflux portion of the exam is performed with the patient in reverse Trendelenburg.  +---------+---------------+---------+-----------+----------+--------------+ RIGHT    CompressibilityPhasicitySpontaneityPropertiesThrombus Aging +---------+---------------+---------+-----------+----------+--------------+ CFV      Full           Yes      Yes                                 +---------+---------------+---------+-----------+----------+--------------+ SFJ      Full                                                        +---------+---------------+---------+-----------+----------+--------------+ FV Prox  Full                                                        +---------+---------------+---------+-----------+----------+--------------+ FV Mid                  Yes      Yes                                  +---------+---------------+---------+-----------+----------+--------------+ FV DistalFull                                                        +---------+---------------+---------+-----------+----------+--------------+  positioned at the superior caval-atrial junction. Final catheter positioning was confirmed and documented with a spot radiographic image. The catheter aspirates and flushes normally. The catheter was flushed with appropriate volume heparin dwells. The catheter exit site was secured with a 2-0 Ethilon retention suture. The venotomy incision was closed with Dermabond. Dressings were applied. The patient tolerated the procedure well without immediate post procedural complication. FINDINGS: After catheter placement, the tip lies within the superior apect of the right atrium. The catheter aspirates and flushes normally and is ready for immediate use. IMPRESSION: Successful placement of a 20 cm dual lumen tunneled central venous "Powerline" catheter via the  RIGHT internal jugular vein The tip of the catheter is positioned within the proximal RIGHT atrium. The catheter is ready for immediate use. Roanna Banning, MD [facility] Radiology Electronically Signed   By: Roanna Banning M.D.   On: 01/09/2023 16:16   [place] EKG SITE RITE  Result Date: 01/08/2023 If Site Rite image not attached, placement could not be confirmed due to current cardiac rhythm.  DG CHEST PORT 1 VIEW  Result Date: 01/03/2023 CLINICAL DATA:  10031 Cough 786.2.ICD-9-CM E6361829 Fever [id number] EXAM: PORTABLE CHEST 1 VIEW COMPARISON:  None Available. FINDINGS: The heart size and mediastinal contours are within normal limits. Left IJ approach Port-A-Cath with the tip projecting near the superior cavoatrial junction. Right subclavian approach central venous catheter with its tip near the superior cavoatrial junction also. Enteric tube courses below the diaphragm with the tip in the third portion of the duodenum. Improved aeration of the lung bases within minimal streaky left basilar opacities. No visible pleural effusions or pneumothorax. No acute osseous abnormality. Left breast implant. Improved aeration lung IMPRESSION: Bases with minimal streaky left basilar opacities. Electronically Signed   By: Feliberto Harts M.D.   On: 01/03/2023 14:31   DG Swallowing Func-Speech Pathology  Result Date: 01/02/2023 Table formatting from the original result was not included. Modified Barium Swallow Study Patient Details Name: [name] MRN: [id number] Date of Birth: 1975-12-17 Today's Date: 12/29/2022 HPI/PMH: HPI: Per CCM note "47 year old female with past medical history of recurrent breast cancer not currently on treatment, Crohn's disease s/p subtotal colectomy in 2021 recurrent obstructions, colonic stricture, IDA, recent hospitalization 8/10-8/15/2024 for obstruction seen by GI and general surgery. Underwent EGD and flexible sigmoidoscopy and discharged on  steroids who presented to the emergency department on 12/12/2022 with abdominal pain, nausea, vomiting diarrhea. In the ED tachycardic, hypertensive. ED provider notes abdominal distention. She was given 2L IVF, morphine, IV PPI. Labs with WBC 10.8, Hgb 10.7, K 3.2. CT AP was ordered and showed markedly dilated distal small bowel consistent with chronic obstruction. No changes since prior study. Patient was admitted to [facility]. They consulted GI for input. Unfortunately, around 0820 9/4 patient was found on the floor in the bathroom in ED pulseless. They estimate downtime around 5 minutes. ACLS was initiated. Concern for possible torsades de pointes and given IV mag, defibrillated. Coded for around 5 minutes. She was intubated. She had copious vomitus in the ETT. ROSC achieved and ICU was consulted. Notably, she had I-stat peri-code which showed hemoglobin of 5.5 down from 10 overnight."  Pt was extubated 12/18/2021.  Pt has been maintained on TPN and is now getting trickle feeds for nutrition. SLP following for dysphagia management. Clinical Impression: Clinical Impression: Today patient presents with improvement in swallow function compared to prior approx six days ago.  Mild pharyngeal dysphagia remains with inconsistent penetration of  Skin: No rashes,  Psychiatry: Mood & affect appropriate.    Condition at discharge:  fair  The results of significant diagnostics from this hospitalization (including imaging, microbiology, ancillary and laboratory) are listed below for reference.   Imaging Studies: DG Swallowing Func-Speech Pathology  Result Date: 01/12/2023 Table formatting from the original result was not included. Modified Barium Swallow Study Patient Details Name: [name] MRN: [id number] Date of Birth: [birth year] Today's Date: 01/12/2023 HPI/PMH: HPI: Per CCM note "47 year old female with past medical history of recurrent breast cancer not currently on treatment, Crohn's disease s/p subtotal colectomy in 2021 recurrent obstructions, colonic stricture, IDA, recent hospitalization 8/10-8/15/2024 for obstruction seen by GI and general surgery. Underwent EGD and flexible sigmoidoscopy and discharged on steroids who presented to the emergency department on 12/12/2022 with abdominal pain, nausea, vomiting diarrhea. In the ED tachycardic, hypertensive. ED provider notes abdominal distention. She was given 2L IVF, morphine, IV PPI. Labs with WBC 10.8, Hgb 10.7, K 3.2. CT AP was ordered and showed markedly dilated distal small bowel consistent with chronic obstruction. 12/13/2022 patient was found on the floor in the bathroom in ED pulseless. They estimate downtime around 5 minutes. ACLS was initiated. Concern for possible torsades de pointes and given IV mag, defibrillated. Coded for around 5 minutes. She was intubated. She had copious vomitus in the ETT. ROSC achieved and ICU was consulted. Notably, she had I-stat peri-code which showed hemoglobin of 5.5 down from 10 overnight."  Pt was extubated 12/18/2021.  Pt has been maintained on TPN and is now getting trickle feeds for nutrition. SLP following for dysphagia management.  Pt dislikes puree foods per notes on 9/23 and diet was advanced to mechanical soft on 9/24.  Intake remains compromised as pt reports feeling full due to overnight feeds - therefore tube feeding stopped  per surgery.  She is continuing to have fevers but denies worsening pain - ID on board.  Pt has remained aphonic since intubation 9/4 - and extubation on 9/10. Clinical Impression: Clinical Impression: Pt continues to make progress with swallowing function, demonstrating improvements in lingual control during bolus hold, laryngeal elevation, and epiglottic inversion. This improves her efficiency, as she has complete pharyngeal clearance, as well as her airway protection. With thin liquids in isolation, she has trace, transient penetration (PAS 2), but she did aspiration when trying to swallow the barium tablet with a liquid wash. The thin liquids were aspirated due to mistiming and decreased laryngeal vestibule closure, and aspiration was sensed but not cleared (PAS 7). Note that the barium tablet also remained in the pt's UES, making it difficult for additional thin liquid wash to enter her esophagus and additional sensed aspiration occurred. A bite of puree helpd move the tablet further into her esophagus, and she needed a sip of nectar thick liquids to move it beyond that point. Pt is not sure if she wants to advance her solid diet at this time, and she does report occasional symptoms similar to what was observed with the barium tablet. Therefore will leave on Dys 3 diet but advance to thin liquids, avoiding mixed consistencies and taking small, single sips via cup. Would continue to offer meds in puree, maybe crushing ones that are larger in size. Pt can likely take per her preference given that she was pretty sensate to difficulty on MBS and can verbalize precautions to take if she has trouble clinically. Factors that may increase risk of adverse event in presence of aspiration Rubye Oaks & [name] 2021): Factors  12/13/2022, 12/23/2022 FINDINGS: Lower chest: There are bilateral pleural effusions and bibasilar atelectasis, right greater than left. No evidence of pneumothorax. Hepatobiliary: Gallbladder is decompressed. Unremarkable unenhanced appearance of the liver. Pancreas: Unremarkable unenhanced appearance. Spleen: Unremarkable unenhanced appearance. Adrenals/Urinary Tract: No urinary tract calculi. Foley catheter decompresses the bladder. The adrenals are unremarkable. Stomach/Bowel: Postsurgical changes from subtotal colectomy, small-bowel resection, and left lower quadrant  ileostomy. Oral contrast is seen throughout the stomach and small bowel to the level of the ileostomy. Gastrojejunostomy tube identified, tip extending into the proximal jejunum just beyond the ligament of Treitz. Vascular/Lymphatic: No significant vascular findings on this unenhanced exam. No pathologic adenopathy. Multiple subcentimeter mesenteric lymph nodes are likely reactive. Reproductive: Stable calcified uterine fibroid.  No adnexal masses. Other: Large volume ascites throughout the abdomen and pelvis. Postsurgical changes are seen from midline laparotomy, with surgical packing material within the laparotomy incision site. There is a small amount of pneumoperitoneum within the upper abdomen, with superimposed gas fluid level. This is nonspecific given recent laparotomy. No evidence of abdominal wall hernia. Musculoskeletal: Diffuse subcutaneous edema. No acute or destructive bony abnormalities. Reconstructed images demonstrate no additional findings. IMPRESSION: 1. Postsurgical changes from subtotal colectomy, small-bowel resection, and left lower quadrant ileostomy. No evidence of bowel obstruction, as oral contrast is seen throughout the stomach and small bowel to the level of the ileostomy. 2. Postsurgical changes from midline laparotomy, with a small amount of pneumoperitoneum within the central upper abdomen. This is nonspecific given recent surgical intervention. 3. Large volume ascites. 4. Bilateral pleural effusions with underlying compressive atelectasis, right greater than left. 5. Diffuse subcutaneous edema. These results were called by telephone at the time of interpretation on 12/25/2022 at 457 pm to provider Chevis Pretty, who verbally acknowledged these results. Electronically Signed   By: Sharlet Salina M.D.   On: 12/25/2022 17:03   DG Abd 1 View  Result Date: 12/23/2022 CLINICAL DATA:  NG tube placement. EXAM: ABDOMEN - 1 VIEW COMPARISON:  12/19/2022. FINDINGS: Enteric tube passes below the  diaphragm, through the stomach into the right mid abdomen projecting the level of the duodenal bulb/pylorus. No bowel dilation. IMPRESSION: 1. Enteric feeding tube tip projects the level of the pylorus/duodenal bulb. Electronically Signed   By: Amie Portland M.D.   On: 12/23/2022 15:12   DG Swallowing Func-Speech Pathology  Result Date: 12/23/2022 Table formatting from the original result was not included. Modified Barium Swallow Study Patient Details Name: [name] MRN: [id number] Date of Birth: 02/04/1976 Today's Date: 12/23/2022 HPI/PMH: HPI: Per CCM note "47 year old female with past medical history of recurrent breast cancer not currently on treatment, Crohn's disease s/p subtotal colectomy in 2021 recurrent obstructions, colonic stricture, IDA, recent hospitalization 8/10-8/15/2024 for obstruction seen by GI and general surgery. Underwent EGD and flexible sigmoidoscopy and discharged on steroids who presented to the emergency department on 12/12/2022 with abdominal pain, nausea, vomiting diarrhea. In the ED tachycardic, hypertensive. ED provider notes abdominal distention. She was given 2L IVF, morphine, IV PPI. Labs with WBC 10.8, Hgb 10.7, K 3.2. CT AP was ordered and showed markedly dilated distal small bowel consistent with chronic obstruction. No changes since prior study. Patient was admitted to [facility]. They consulted GI for input. Unfortunately, around 0820 9/4 patient was found on the floor in the bathroom in ED pulseless. They estimate downtime around 5 minutes. ACLS was initiated. Concern for possible torsades de pointes and given IV mag, defibrillated. Coded for around 5 minutes. She was intubated. She had copious vomitus  Physician Discharge Summary   Patient: [name] MRN: [id number] DOB: Nov 21, 1975  Admit date:     12/12/2022  Discharge date: 01/16/23  Discharge Physician: Kathlen Mody   PCP: Ollen Bowl, MD   Recommendations at discharge:  Please follow up with ID as scheduled.  Please follow up with General surgery, PT AND SLP at [facility].  Recommend to continue with TPN.  sterile water SOLN 40.9 mL with Travasol 10 % SOLN 94.08 g, dextrose 70 % SOLN 235.2 g, sodium ACETATE 2 MEQ/ML SOLN 46.3 mEq, sodium chloride 4 MEQ/ML SOLN 121.7 mEq, potassium ACETATE 2 MEQ/ML SOLN 30.24 mEq, potassium PHOSPHATE 3 mmol/mL SOLN 25.2 mmol, calcium GLUCONATE INJ 8.4 mEq, magnesium SULFATE INJ 16.8 mEq, M.V.I. 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   Port-A-Cath in place   Breast cancer (HCC)   Leukocytosis   Protein-calorie malnutrition, severe (HCC)   Failure to thrive in adult   Anorexia   Cardiac arrest (HCC)   Septic shock (HCC)   SBO with strangulation s/p resection 12/14/2022   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   Malnutrition of moderate degree   Palliative care by specialist   Abdominal pain   Fever, unspecified   Acute pulmonary embolism without acute cor pulmonale (HCC)  Resolved Problems:   Exacerbation of Crohn's disease of large intestine (HCC)   History of Crohn's disease   Colonic stricture (HCC)   Small bowel obstruction (HCC)   SBO (small bowel obstruction) (HCC)   Non-traumatic compartment syndrome of abdomen   Malnutrition of moderate degree    History of resection of small bowel   Crohn's disease of small intestine with intestinal obstruction [facility])  Hospital Course: [name] is a 47 y.o. female with PMH significant for Crohn's disease with previous bowel obstruction s/p subtotal colectomy 2021, colonic stricture, IDA, breast cancer s/p mastectomy 2022 currently not on treatment. Recently hospitalized 8/10-8/15/2024 for bowel obstruction seen by GI and general surgery. Underwent EGD and flexible sigmoidoscopy and discharged on steroids.   9/3, patient presented to the ED with abdominal distention, pain, nausea, vomiting CT abd/pelvis showed markedly dilated distal small bowel consistent with chronic obstruction. No changes since prior study.  Patient was admitted to [facility] with a plan of GI consultation.   9/4, in the morning, within few hours of admission, patient was found pulseless on the floor in the bathroom in ED, estimated downtime about 5 minutes. ACLS was initiated. Concern for possible torsades de pointes and given IV mag, defibrillated.  ROSC achieved in about 5 minutes. She was intubated. She had copious vomitus in the ETT.  Notably, she had I-stat peri-code which showed hemoglobin of 5.5 down from 10 overnight.  Admitted to ICU, underwent 3 units of PRBC and 3 units of FFP. She was started on vasopressors.    9/5, underwent exploratory laparotomy.  Noted to have internal hernia with associated small bowel ischemia.  Underwent bowel resection. 9/7, back to OR for ostomy placement 9/8, weaned off Levophed 9/10, extubated 9/10, found to have PE in CT angio chest.  CORNETT , who verbally acknowledged these results. Electronically Signed   By: Marin Roberts M.D.   On: 12/31/2022 11:19   VAS [place] UPPER EXTREMITY VENOUS DUPLEX  Result Date: 12/30/2022 UPPER VENOUS STUDY  Patient Name:  [name]  Date of Exam:   12/28/2022 Medical Rec #: [id number]         Accession #:    [id number] Date of Birth: 04-22-1975         Patient Gender: F Patient Age:   47 years Exam Location:  [facility] Procedure:      VAS [place] UPPER EXTREMITY VENOUS DUPLEX Referring Phys: JEFFREY HATCHER --------------------------------------------------------------------------------  Indications: PE not on anticoagulation Limitations: Line and port placement. Comparison Study: No previous UE exams Performing Technologist: Jody Hill RVT, RDMS  Examination Guidelines: A complete evaluation includes B-mode imaging, spectral Doppler, color Doppler, and power Doppler as needed of all accessible portions of each vessel. Bilateral testing is considered an integral part of a complete examination. Limited examinations for reoccurring indications may be performed as noted.  Right Findings: +----------+------------+---------+-----------+----------+--------------+ RIGHT     CompressiblePhasicitySpontaneousProperties   Summary     +----------+------------+---------+-----------+----------+--------------+ IJV           Full       Yes       Yes                             +----------+------------+---------+-----------+----------+--------------+ Subclavian                                          Not visualized  +----------+------------+---------+-----------+----------+--------------+ Axillary      Full       Yes       Yes                             +----------+------------+---------+-----------+----------+--------------+ Brachial      Full       Yes       Yes                             +----------+------------+---------+-----------+----------+--------------+ Radial        Full                                                 +----------+------------+---------+-----------+----------+--------------+ Ulnar         Full                                                 +----------+------------+---------+-----------+----------+--------------+ Cephalic      Full                                                 +----------+------------+---------+-----------+----------+--------------+ Basilic       Full       Yes       Yes                             +----------+------------+---------+-----------+----------+--------------+  Skin: No rashes,  Psychiatry: Mood & affect appropriate.    Condition at discharge:  fair  The results of significant diagnostics from this hospitalization (including imaging, microbiology, ancillary and laboratory) are listed below for reference.   Imaging Studies: DG Swallowing Func-Speech Pathology  Result Date: 01/12/2023 Table formatting from the original result was not included. Modified Barium Swallow Study Patient Details Name: [name] MRN: [id number] Date of Birth: [birth year] Today's Date: 01/12/2023 HPI/PMH: HPI: Per CCM note "47 year old female with past medical history of recurrent breast cancer not currently on treatment, Crohn's disease s/p subtotal colectomy in 2021 recurrent obstructions, colonic stricture, IDA, recent hospitalization 8/10-8/15/2024 for obstruction seen by GI and general surgery. Underwent EGD and flexible sigmoidoscopy and discharged on steroids who presented to the emergency department on 12/12/2022 with abdominal pain, nausea, vomiting diarrhea. In the ED tachycardic, hypertensive. ED provider notes abdominal distention. She was given 2L IVF, morphine, IV PPI. Labs with WBC 10.8, Hgb 10.7, K 3.2. CT AP was ordered and showed markedly dilated distal small bowel consistent with chronic obstruction. 12/13/2022 patient was found on the floor in the bathroom in ED pulseless. They estimate downtime around 5 minutes. ACLS was initiated. Concern for possible torsades de pointes and given IV mag, defibrillated. Coded for around 5 minutes. She was intubated. She had copious vomitus in the ETT. ROSC achieved and ICU was consulted. Notably, she had I-stat peri-code which showed hemoglobin of 5.5 down from 10 overnight."  Pt was extubated 12/18/2021.  Pt has been maintained on TPN and is now getting trickle feeds for nutrition. SLP following for dysphagia management.  Pt dislikes puree foods per notes on 9/23 and diet was advanced to mechanical soft on 9/24.  Intake remains compromised as pt reports feeling full due to overnight feeds - therefore tube feeding stopped  per surgery.  She is continuing to have fevers but denies worsening pain - ID on board.  Pt has remained aphonic since intubation 9/4 - and extubation on 9/10. Clinical Impression: Clinical Impression: Pt continues to make progress with swallowing function, demonstrating improvements in lingual control during bolus hold, laryngeal elevation, and epiglottic inversion. This improves her efficiency, as she has complete pharyngeal clearance, as well as her airway protection. With thin liquids in isolation, she has trace, transient penetration (PAS 2), but she did aspiration when trying to swallow the barium tablet with a liquid wash. The thin liquids were aspirated due to mistiming and decreased laryngeal vestibule closure, and aspiration was sensed but not cleared (PAS 7). Note that the barium tablet also remained in the pt's UES, making it difficult for additional thin liquid wash to enter her esophagus and additional sensed aspiration occurred. A bite of puree helpd move the tablet further into her esophagus, and she needed a sip of nectar thick liquids to move it beyond that point. Pt is not sure if she wants to advance her solid diet at this time, and she does report occasional symptoms similar to what was observed with the barium tablet. Therefore will leave on Dys 3 diet but advance to thin liquids, avoiding mixed consistencies and taking small, single sips via cup. Would continue to offer meds in puree, maybe crushing ones that are larger in size. Pt can likely take per her preference given that she was pretty sensate to difficulty on MBS and can verbalize precautions to take if she has trouble clinically. Factors that may increase risk of adverse event in presence of aspiration Rubye Oaks & [name] 2021): Factors  CORNETT , who verbally acknowledged these results. Electronically Signed   By: Marin Roberts M.D.   On: 12/31/2022 11:19   VAS [place] UPPER EXTREMITY VENOUS DUPLEX  Result Date: 12/30/2022 UPPER VENOUS STUDY  Patient Name:  [name]  Date of Exam:   12/28/2022 Medical Rec #: [id number]         Accession #:    [id number] Date of Birth: 04-22-1975         Patient Gender: F Patient Age:   47 years Exam Location:  [facility] Procedure:      VAS [place] UPPER EXTREMITY VENOUS DUPLEX Referring Phys: JEFFREY HATCHER --------------------------------------------------------------------------------  Indications: PE not on anticoagulation Limitations: Line and port placement. Comparison Study: No previous UE exams Performing Technologist: Jody Hill RVT, RDMS  Examination Guidelines: A complete evaluation includes B-mode imaging, spectral Doppler, color Doppler, and power Doppler as needed of all accessible portions of each vessel. Bilateral testing is considered an integral part of a complete examination. Limited examinations for reoccurring indications may be performed as noted.  Right Findings: +----------+------------+---------+-----------+----------+--------------+ RIGHT     CompressiblePhasicitySpontaneousProperties   Summary     +----------+------------+---------+-----------+----------+--------------+ IJV           Full       Yes       Yes                             +----------+------------+---------+-----------+----------+--------------+ Subclavian                                          Not visualized  +----------+------------+---------+-----------+----------+--------------+ Axillary      Full       Yes       Yes                             +----------+------------+---------+-----------+----------+--------------+ Brachial      Full       Yes       Yes                             +----------+------------+---------+-----------+----------+--------------+ Radial        Full                                                 +----------+------------+---------+-----------+----------+--------------+ Ulnar         Full                                                 +----------+------------+---------+-----------+----------+--------------+ Cephalic      Full                                                 +----------+------------+---------+-----------+----------+--------------+ Basilic       Full       Yes       Yes                             +----------+------------+---------+-----------+----------+--------------+  lung base. EXAM: PORTABLE CHEST 1 VIEW COMPARISON:  12/19/2022. FINDINGS: The heart size and mediastinal contours are stable. A left chest port and right central venous catheter terminates over the superior vena cava. Hazy opacities are noted in the mid to lower lung fields bilaterally suggesting moderate layering pleural effusions. Airspace disease is noted at the lung bases. No pneumothorax. An enteric tube terminates in the stomach. IMPRESSION: Hazy opacities in the mid to lower lung fields bilaterally likely reflecting moderate layering pleural effusions with atelectasis or infiltrate. Electronically Signed   By: Thornell Sartorius M.D.   On: 12/22/2022 04:45   VAS [place] LOWER EXTREMITY VENOUS (DVT)  Result Date: 12/21/2022  Lower Venous DVT Study Patient Name:  GAYNOR GENCO  Date of Exam:   12/20/2022 Medical Rec #: [id number]         Accession #:    [id number] Date of Birth: 1975-06-08         Patient Gender: F Patient Age:   13 years Exam Location:  [facility] Procedure:      VAS [place] LOWER EXTREMITY VENOUS (DVT) Referring Phys: Mertha Baars --------------------------------------------------------------------------------  Indications: Pulmonary embolism.  Risk Factors: Cancer. Limitations: Poor ultrasound/tissue interface, bandages and patient positioning, patient immobility. Comparison Study: 12/14/2022 - Negative for DVT. Performing Technologist: Chanda Busing RVT  Examination Guidelines: A complete evaluation includes B-mode imaging, spectral Doppler, color Doppler, and power Doppler as needed of all accessible portions of each vessel. Bilateral testing is considered an integral part of a complete examination. Limited examinations for reoccurring indications may  be performed as noted. The reflux portion of the exam is performed with the patient in reverse Trendelenburg.  +---------+---------------+---------+-----------+----------+--------------+ RIGHT    CompressibilityPhasicitySpontaneityPropertiesThrombus Aging +---------+---------------+---------+-----------+----------+--------------+ CFV      Full           Yes      Yes                                 +---------+---------------+---------+-----------+----------+--------------+ SFJ      Full                                                        +---------+---------------+---------+-----------+----------+--------------+ FV Prox  Full                                                        +---------+---------------+---------+-----------+----------+--------------+ FV Mid                  Yes      Yes                                 +---------+---------------+---------+-----------+----------+--------------+ FV Distal               Yes      Yes                                 +---------+---------------+---------+-----------+----------+--------------+ PFV      Full                                                        +---------+---------------+---------+-----------+----------+--------------+  lung base. EXAM: PORTABLE CHEST 1 VIEW COMPARISON:  12/19/2022. FINDINGS: The heart size and mediastinal contours are stable. A left chest port and right central venous catheter terminates over the superior vena cava. Hazy opacities are noted in the mid to lower lung fields bilaterally suggesting moderate layering pleural effusions. Airspace disease is noted at the lung bases. No pneumothorax. An enteric tube terminates in the stomach. IMPRESSION: Hazy opacities in the mid to lower lung fields bilaterally likely reflecting moderate layering pleural effusions with atelectasis or infiltrate. Electronically Signed   By: Thornell Sartorius M.D.   On: 12/22/2022 04:45   VAS [place] LOWER EXTREMITY VENOUS (DVT)  Result Date: 12/21/2022  Lower Venous DVT Study Patient Name:  GAYNOR GENCO  Date of Exam:   12/20/2022 Medical Rec #: [id number]         Accession #:    [id number] Date of Birth: 1975-06-08         Patient Gender: F Patient Age:   13 years Exam Location:  [facility] Procedure:      VAS [place] LOWER EXTREMITY VENOUS (DVT) Referring Phys: Mertha Baars --------------------------------------------------------------------------------  Indications: Pulmonary embolism.  Risk Factors: Cancer. Limitations: Poor ultrasound/tissue interface, bandages and patient positioning, patient immobility. Comparison Study: 12/14/2022 - Negative for DVT. Performing Technologist: Chanda Busing RVT  Examination Guidelines: A complete evaluation includes B-mode imaging, spectral Doppler, color Doppler, and power Doppler as needed of all accessible portions of each vessel. Bilateral testing is considered an integral part of a complete examination. Limited examinations for reoccurring indications may  be performed as noted. The reflux portion of the exam is performed with the patient in reverse Trendelenburg.  +---------+---------------+---------+-----------+----------+--------------+ RIGHT    CompressibilityPhasicitySpontaneityPropertiesThrombus Aging +---------+---------------+---------+-----------+----------+--------------+ CFV      Full           Yes      Yes                                 +---------+---------------+---------+-----------+----------+--------------+ SFJ      Full                                                        +---------+---------------+---------+-----------+----------+--------------+ FV Prox  Full                                                        +---------+---------------+---------+-----------+----------+--------------+ FV Mid                  Yes      Yes                                 +---------+---------------+---------+-----------+----------+--------------+ FV Distal               Yes      Yes                                 +---------+---------------+---------+-----------+----------+--------------+ PFV      Full                                                        +---------+---------------+---------+-----------+----------+--------------+  Summary: No evidence of deep vein or superficial vein thrombosis involving the right and left upper extremities. Right: However, unable to visualize the subclavian vein.  *See table(s) above for measurements and observations.  Diagnosing physician: Coral Else MD Electronically signed by Coral Else MD on 12/30/2022 at 10:08:06 AM.    Final    DG CHEST PORT 1 VIEW  Result Date: 12/27/2022 CLINICAL DATA:  Fever EXAM: PORTABLE CHEST 1 VIEW COMPARISON:  12/22/2022 FINDINGS: Enteric tube extends below the diaphragm with distal tip terminating within the third portion of the duodenum. Left chest port and right subclavian central venous catheter remain appropriately positioned. Normal heart size. Left greater than right bibasilar opacities with bilateral pleural effusions, slightly improved. No pneumothorax. IMPRESSION: Left greater than right bibasilar opacities with bilateral pleural effusions, slightly improved. Electronically Signed   By: Duanne Guess D.O.   On: 12/27/2022 13:43   VAS [place] LOWER EXTREMITY VENOUS  (DVT)  Result Date: 12/27/2022  Lower Venous DVT Study Patient Name:  [name]  Date of Exam:   12/27/2022 Medical Rec #: [id number]         Accession #:    [id number] Date of Birth: 1975/11/15         Patient Gender: F Patient Age:   47 years Exam Location:  [facility] Procedure:      VAS [place] LOWER EXTREMITY VENOUS (DVT) Referring Phys: MICHAEL MACZIS --------------------------------------------------------------------------------  Indications: Pulmonary embolism.  Risk Factors: Confirmed PE. Limitations: Poor ultrasound/tissue interface, bandages, open wound and patient positioning. Comparison Study: 12/14/2022, 12/20/2022 - Negative for DVT. Performing Technologist: Chanda Busing RVT  Examination Guidelines: A complete evaluation includes B-mode imaging, spectral Doppler, color Doppler, and power Doppler as needed of all accessible portions of each vessel. Bilateral testing is considered an integral part of a complete examination. Limited examinations for reoccurring indications may be performed as noted. The reflux portion of the exam is performed with the patient in reverse Trendelenburg.  +---------+---------------+---------+-----------+----------+--------------+ RIGHT    CompressibilityPhasicitySpontaneityPropertiesThrombus Aging +---------+---------------+---------+-----------+----------+--------------+ CFV      Full           Yes      Yes                                 +---------+---------------+---------+-----------+----------+--------------+ SFJ      Full                                                        +---------+---------------+---------+-----------+----------+--------------+ FV Prox  Full                                                        +---------+---------------+---------+-----------+----------+--------------+ FV Mid                  Yes      Yes                                  +---------+---------------+---------+-----------+----------+--------------+ FV DistalFull                                                        +---------+---------------+---------+-----------+----------+--------------+  CORNETT , who verbally acknowledged these results. Electronically Signed   By: Marin Roberts M.D.   On: 12/31/2022 11:19   VAS [place] UPPER EXTREMITY VENOUS DUPLEX  Result Date: 12/30/2022 UPPER VENOUS STUDY  Patient Name:  [name]  Date of Exam:   12/28/2022 Medical Rec #: [id number]         Accession #:    [id number] Date of Birth: 04-22-1975         Patient Gender: F Patient Age:   47 years Exam Location:  [facility] Procedure:      VAS [place] UPPER EXTREMITY VENOUS DUPLEX Referring Phys: JEFFREY HATCHER --------------------------------------------------------------------------------  Indications: PE not on anticoagulation Limitations: Line and port placement. Comparison Study: No previous UE exams Performing Technologist: Jody Hill RVT, RDMS  Examination Guidelines: A complete evaluation includes B-mode imaging, spectral Doppler, color Doppler, and power Doppler as needed of all accessible portions of each vessel. Bilateral testing is considered an integral part of a complete examination. Limited examinations for reoccurring indications may be performed as noted.  Right Findings: +----------+------------+---------+-----------+----------+--------------+ RIGHT     CompressiblePhasicitySpontaneousProperties   Summary     +----------+------------+---------+-----------+----------+--------------+ IJV           Full       Yes       Yes                             +----------+------------+---------+-----------+----------+--------------+ Subclavian                                          Not visualized  +----------+------------+---------+-----------+----------+--------------+ Axillary      Full       Yes       Yes                             +----------+------------+---------+-----------+----------+--------------+ Brachial      Full       Yes       Yes                             +----------+------------+---------+-----------+----------+--------------+ Radial        Full                                                 +----------+------------+---------+-----------+----------+--------------+ Ulnar         Full                                                 +----------+------------+---------+-----------+----------+--------------+ Cephalic      Full                                                 +----------+------------+---------+-----------+----------+--------------+ Basilic       Full       Yes       Yes                             +----------+------------+---------+-----------+----------+--------------+  lung base. EXAM: PORTABLE CHEST 1 VIEW COMPARISON:  12/19/2022. FINDINGS: The heart size and mediastinal contours are stable. A left chest port and right central venous catheter terminates over the superior vena cava. Hazy opacities are noted in the mid to lower lung fields bilaterally suggesting moderate layering pleural effusions. Airspace disease is noted at the lung bases. No pneumothorax. An enteric tube terminates in the stomach. IMPRESSION: Hazy opacities in the mid to lower lung fields bilaterally likely reflecting moderate layering pleural effusions with atelectasis or infiltrate. Electronically Signed   By: Thornell Sartorius M.D.   On: 12/22/2022 04:45   VAS [place] LOWER EXTREMITY VENOUS (DVT)  Result Date: 12/21/2022  Lower Venous DVT Study Patient Name:  GAYNOR GENCO  Date of Exam:   12/20/2022 Medical Rec #: [id number]         Accession #:    [id number] Date of Birth: 1975-06-08         Patient Gender: F Patient Age:   13 years Exam Location:  [facility] Procedure:      VAS [place] LOWER EXTREMITY VENOUS (DVT) Referring Phys: Mertha Baars --------------------------------------------------------------------------------  Indications: Pulmonary embolism.  Risk Factors: Cancer. Limitations: Poor ultrasound/tissue interface, bandages and patient positioning, patient immobility. Comparison Study: 12/14/2022 - Negative for DVT. Performing Technologist: Chanda Busing RVT  Examination Guidelines: A complete evaluation includes B-mode imaging, spectral Doppler, color Doppler, and power Doppler as needed of all accessible portions of each vessel. Bilateral testing is considered an integral part of a complete examination. Limited examinations for reoccurring indications may  be performed as noted. The reflux portion of the exam is performed with the patient in reverse Trendelenburg.  +---------+---------------+---------+-----------+----------+--------------+ RIGHT    CompressibilityPhasicitySpontaneityPropertiesThrombus Aging +---------+---------------+---------+-----------+----------+--------------+ CFV      Full           Yes      Yes                                 +---------+---------------+---------+-----------+----------+--------------+ SFJ      Full                                                        +---------+---------------+---------+-----------+----------+--------------+ FV Prox  Full                                                        +---------+---------------+---------+-----------+----------+--------------+ FV Mid                  Yes      Yes                                 +---------+---------------+---------+-----------+----------+--------------+ FV Distal               Yes      Yes                                 +---------+---------------+---------+-----------+----------+--------------+ PFV      Full                                                        +---------+---------------+---------+-----------+----------+--------------+  PTV      Full                                                        +---------+---------------+---------+-----------+----------+--------------+ PERO     Full                                                        +---------+---------------+---------+-----------+----------+--------------+     Summary: RIGHT: - There is no evidence of deep vein thrombosis in the lower extremity. However, portions of this examination were limited- see technologist comments above.  - No cystic structure found in the popliteal fossa.  LEFT: - There is no evidence of deep vein thrombosis in the lower extremity. However, portions of this examination were limited- see technologist comments above.  - No cystic structure found in the popliteal fossa.  *See table(s) above for measurements and observations. Electronically signed by Gerarda Fraction on 12/21/2022 at 2:58:08 PM.    Final     ECHOCARDIOGRAM LIMITED  Result Date: 12/20/2022    ECHOCARDIOGRAM LIMITED REPORT   Patient Name:   [name] Date of Exam: 12/20/2022 Medical Rec #:  [id number]        Height:       64.0 in Accession #:    [id number]       Weight:       140.4 lb Date of Birth:  1976/03/18        BSA:          1.683 m Patient Age:    47 years         BP:           170/111 mmHg Patient Gender: F                HR:           120 bpm. Exam Location:  Inpatient Procedure: Cardiac Doppler, Limited Echo and Limited Color Doppler Indications:    Pulmonary Embolus I26.09  History:        Patient has prior history of Echocardiogram examinations, most                 recent 12/15/2022. Breast Cancer, Arrythmias:Cardiac Arrest; Risk                 Factors:Former Smoker.  Sonographer:    Aron Baba Referring Phys: [id number] Cristopher Peru  Sonographer Comments: No apical window and suboptimal subcostal window. Image acquisition challenging due to breast implants. IMPRESSIONS  1. Left ventricular ejection fraction, by estimation, is 60 to 65%. The left ventricle has normal function.  2. Right ventricular systolic function is normal. The right ventricular size is normal. Tricuspid regurgitation signal is inadequate for assessing PA pressure.  3. Aortic valve regurgitation is not visualized.  4. The inferior vena cava is dilated in size with >50% respiratory variability, suggesting right atrial pressure of 8 mmHg. Conclusion(s)/Recommendation(s): No RV strain, no pulmonary hypertension. FINDINGS  Left Ventricle: Left ventricular ejection fraction, by estimation, is 60 to 65%. The left ventricle has normal function. There is no left ventricular hypertrophy. Right Ventricle: The right ventricular size is normal. Right ventricular systolic  Skin: No rashes,  Psychiatry: Mood & affect appropriate.    Condition at discharge:  fair  The results of significant diagnostics from this hospitalization (including imaging, microbiology, ancillary and laboratory) are listed below for reference.   Imaging Studies: DG Swallowing Func-Speech Pathology  Result Date: 01/12/2023 Table formatting from the original result was not included. Modified Barium Swallow Study Patient Details Name: [name] MRN: [id number] Date of Birth: [birth year] Today's Date: 01/12/2023 HPI/PMH: HPI: Per CCM note "47 year old female with past medical history of recurrent breast cancer not currently on treatment, Crohn's disease s/p subtotal colectomy in 2021 recurrent obstructions, colonic stricture, IDA, recent hospitalization 8/10-8/15/2024 for obstruction seen by GI and general surgery. Underwent EGD and flexible sigmoidoscopy and discharged on steroids who presented to the emergency department on 12/12/2022 with abdominal pain, nausea, vomiting diarrhea. In the ED tachycardic, hypertensive. ED provider notes abdominal distention. She was given 2L IVF, morphine, IV PPI. Labs with WBC 10.8, Hgb 10.7, K 3.2. CT AP was ordered and showed markedly dilated distal small bowel consistent with chronic obstruction. 12/13/2022 patient was found on the floor in the bathroom in ED pulseless. They estimate downtime around 5 minutes. ACLS was initiated. Concern for possible torsades de pointes and given IV mag, defibrillated. Coded for around 5 minutes. She was intubated. She had copious vomitus in the ETT. ROSC achieved and ICU was consulted. Notably, she had I-stat peri-code which showed hemoglobin of 5.5 down from 10 overnight."  Pt was extubated 12/18/2021.  Pt has been maintained on TPN and is now getting trickle feeds for nutrition. SLP following for dysphagia management.  Pt dislikes puree foods per notes on 9/23 and diet was advanced to mechanical soft on 9/24.  Intake remains compromised as pt reports feeling full due to overnight feeds - therefore tube feeding stopped  per surgery.  She is continuing to have fevers but denies worsening pain - ID on board.  Pt has remained aphonic since intubation 9/4 - and extubation on 9/10. Clinical Impression: Clinical Impression: Pt continues to make progress with swallowing function, demonstrating improvements in lingual control during bolus hold, laryngeal elevation, and epiglottic inversion. This improves her efficiency, as she has complete pharyngeal clearance, as well as her airway protection. With thin liquids in isolation, she has trace, transient penetration (PAS 2), but she did aspiration when trying to swallow the barium tablet with a liquid wash. The thin liquids were aspirated due to mistiming and decreased laryngeal vestibule closure, and aspiration was sensed but not cleared (PAS 7). Note that the barium tablet also remained in the pt's UES, making it difficult for additional thin liquid wash to enter her esophagus and additional sensed aspiration occurred. A bite of puree helpd move the tablet further into her esophagus, and she needed a sip of nectar thick liquids to move it beyond that point. Pt is not sure if she wants to advance her solid diet at this time, and she does report occasional symptoms similar to what was observed with the barium tablet. Therefore will leave on Dys 3 diet but advance to thin liquids, avoiding mixed consistencies and taking small, single sips via cup. Would continue to offer meds in puree, maybe crushing ones that are larger in size. Pt can likely take per her preference given that she was pretty sensate to difficulty on MBS and can verbalize precautions to take if she has trouble clinically. Factors that may increase risk of adverse event in presence of aspiration Rubye Oaks & [name] 2021): Factors  Summary: No evidence of deep vein or superficial vein thrombosis involving the right and left upper extremities. Right: However, unable to visualize the subclavian vein.  *See table(s) above for measurements and observations.  Diagnosing physician: Coral Else MD Electronically signed by Coral Else MD on 12/30/2022 at 10:08:06 AM.    Final    DG CHEST PORT 1 VIEW  Result Date: 12/27/2022 CLINICAL DATA:  Fever EXAM: PORTABLE CHEST 1 VIEW COMPARISON:  12/22/2022 FINDINGS: Enteric tube extends below the diaphragm with distal tip terminating within the third portion of the duodenum. Left chest port and right subclavian central venous catheter remain appropriately positioned. Normal heart size. Left greater than right bibasilar opacities with bilateral pleural effusions, slightly improved. No pneumothorax. IMPRESSION: Left greater than right bibasilar opacities with bilateral pleural effusions, slightly improved. Electronically Signed   By: Duanne Guess D.O.   On: 12/27/2022 13:43   VAS [place] LOWER EXTREMITY VENOUS  (DVT)  Result Date: 12/27/2022  Lower Venous DVT Study Patient Name:  [name]  Date of Exam:   12/27/2022 Medical Rec #: [id number]         Accession #:    [id number] Date of Birth: 1975/11/15         Patient Gender: F Patient Age:   47 years Exam Location:  [facility] Procedure:      VAS [place] LOWER EXTREMITY VENOUS (DVT) Referring Phys: MICHAEL MACZIS --------------------------------------------------------------------------------  Indications: Pulmonary embolism.  Risk Factors: Confirmed PE. Limitations: Poor ultrasound/tissue interface, bandages, open wound and patient positioning. Comparison Study: 12/14/2022, 12/20/2022 - Negative for DVT. Performing Technologist: Chanda Busing RVT  Examination Guidelines: A complete evaluation includes B-mode imaging, spectral Doppler, color Doppler, and power Doppler as needed of all accessible portions of each vessel. Bilateral testing is considered an integral part of a complete examination. Limited examinations for reoccurring indications may be performed as noted. The reflux portion of the exam is performed with the patient in reverse Trendelenburg.  +---------+---------------+---------+-----------+----------+--------------+ RIGHT    CompressibilityPhasicitySpontaneityPropertiesThrombus Aging +---------+---------------+---------+-----------+----------+--------------+ CFV      Full           Yes      Yes                                 +---------+---------------+---------+-----------+----------+--------------+ SFJ      Full                                                        +---------+---------------+---------+-----------+----------+--------------+ FV Prox  Full                                                        +---------+---------------+---------+-----------+----------+--------------+ FV Mid                  Yes      Yes                                  +---------+---------------+---------+-----------+----------+--------------+ FV DistalFull                                                        +---------+---------------+---------+-----------+----------+--------------+  Physician Discharge Summary   Patient: [name] MRN: [id number] DOB: Nov 21, 1975  Admit date:     12/12/2022  Discharge date: 01/16/23  Discharge Physician: Kathlen Mody   PCP: Ollen Bowl, MD   Recommendations at discharge:  Please follow up with ID as scheduled.  Please follow up with General surgery, PT AND SLP at [facility].  Recommend to continue with TPN.  sterile water SOLN 40.9 mL with Travasol 10 % SOLN 94.08 g, dextrose 70 % SOLN 235.2 g, sodium ACETATE 2 MEQ/ML SOLN 46.3 mEq, sodium chloride 4 MEQ/ML SOLN 121.7 mEq, potassium ACETATE 2 MEQ/ML SOLN 30.24 mEq, potassium PHOSPHATE 3 mmol/mL SOLN 25.2 mmol, calcium GLUCONATE INJ 8.4 mEq, magnesium SULFATE INJ 16.8 mEq, M.V.I. 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   Port-A-Cath in place   Breast cancer (HCC)   Leukocytosis   Protein-calorie malnutrition, severe (HCC)   Failure to thrive in adult   Anorexia   Cardiac arrest (HCC)   Septic shock (HCC)   SBO with strangulation s/p resection 12/14/2022   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   Malnutrition of moderate degree   Palliative care by specialist   Abdominal pain   Fever, unspecified   Acute pulmonary embolism without acute cor pulmonale (HCC)  Resolved Problems:   Exacerbation of Crohn's disease of large intestine (HCC)   History of Crohn's disease   Colonic stricture (HCC)   Small bowel obstruction (HCC)   SBO (small bowel obstruction) (HCC)   Non-traumatic compartment syndrome of abdomen   Malnutrition of moderate degree    History of resection of small bowel   Crohn's disease of small intestine with intestinal obstruction [facility])  Hospital Course: [name] is a 47 y.o. female with PMH significant for Crohn's disease with previous bowel obstruction s/p subtotal colectomy 2021, colonic stricture, IDA, breast cancer s/p mastectomy 2022 currently not on treatment. Recently hospitalized 8/10-8/15/2024 for bowel obstruction seen by GI and general surgery. Underwent EGD and flexible sigmoidoscopy and discharged on steroids.   9/3, patient presented to the ED with abdominal distention, pain, nausea, vomiting CT abd/pelvis showed markedly dilated distal small bowel consistent with chronic obstruction. No changes since prior study.  Patient was admitted to [facility] with a plan of GI consultation.   9/4, in the morning, within few hours of admission, patient was found pulseless on the floor in the bathroom in ED, estimated downtime about 5 minutes. ACLS was initiated. Concern for possible torsades de pointes and given IV mag, defibrillated.  ROSC achieved in about 5 minutes. She was intubated. She had copious vomitus in the ETT.  Notably, she had I-stat peri-code which showed hemoglobin of 5.5 down from 10 overnight.  Admitted to ICU, underwent 3 units of PRBC and 3 units of FFP. She was started on vasopressors.    9/5, underwent exploratory laparotomy.  Noted to have internal hernia with associated small bowel ischemia.  Underwent bowel resection. 9/7, back to OR for ostomy placement 9/8, weaned off Levophed 9/10, extubated 9/10, found to have PE in CT angio chest.  Skin: No rashes,  Psychiatry: Mood & affect appropriate.    Condition at discharge:  fair  The results of significant diagnostics from this hospitalization (including imaging, microbiology, ancillary and laboratory) are listed below for reference.   Imaging Studies: DG Swallowing Func-Speech Pathology  Result Date: 01/12/2023 Table formatting from the original result was not included. Modified Barium Swallow Study Patient Details Name: [name] MRN: [id number] Date of Birth: [birth year] Today's Date: 01/12/2023 HPI/PMH: HPI: Per CCM note "47 year old female with past medical history of recurrent breast cancer not currently on treatment, Crohn's disease s/p subtotal colectomy in 2021 recurrent obstructions, colonic stricture, IDA, recent hospitalization 8/10-8/15/2024 for obstruction seen by GI and general surgery. Underwent EGD and flexible sigmoidoscopy and discharged on steroids who presented to the emergency department on 12/12/2022 with abdominal pain, nausea, vomiting diarrhea. In the ED tachycardic, hypertensive. ED provider notes abdominal distention. She was given 2L IVF, morphine, IV PPI. Labs with WBC 10.8, Hgb 10.7, K 3.2. CT AP was ordered and showed markedly dilated distal small bowel consistent with chronic obstruction. 12/13/2022 patient was found on the floor in the bathroom in ED pulseless. They estimate downtime around 5 minutes. ACLS was initiated. Concern for possible torsades de pointes and given IV mag, defibrillated. Coded for around 5 minutes. She was intubated. She had copious vomitus in the ETT. ROSC achieved and ICU was consulted. Notably, she had I-stat peri-code which showed hemoglobin of 5.5 down from 10 overnight."  Pt was extubated 12/18/2021.  Pt has been maintained on TPN and is now getting trickle feeds for nutrition. SLP following for dysphagia management.  Pt dislikes puree foods per notes on 9/23 and diet was advanced to mechanical soft on 9/24.  Intake remains compromised as pt reports feeling full due to overnight feeds - therefore tube feeding stopped  per surgery.  She is continuing to have fevers but denies worsening pain - ID on board.  Pt has remained aphonic since intubation 9/4 - and extubation on 9/10. Clinical Impression: Clinical Impression: Pt continues to make progress with swallowing function, demonstrating improvements in lingual control during bolus hold, laryngeal elevation, and epiglottic inversion. This improves her efficiency, as she has complete pharyngeal clearance, as well as her airway protection. With thin liquids in isolation, she has trace, transient penetration (PAS 2), but she did aspiration when trying to swallow the barium tablet with a liquid wash. The thin liquids were aspirated due to mistiming and decreased laryngeal vestibule closure, and aspiration was sensed but not cleared (PAS 7). Note that the barium tablet also remained in the pt's UES, making it difficult for additional thin liquid wash to enter her esophagus and additional sensed aspiration occurred. A bite of puree helpd move the tablet further into her esophagus, and she needed a sip of nectar thick liquids to move it beyond that point. Pt is not sure if she wants to advance her solid diet at this time, and she does report occasional symptoms similar to what was observed with the barium tablet. Therefore will leave on Dys 3 diet but advance to thin liquids, avoiding mixed consistencies and taking small, single sips via cup. Would continue to offer meds in puree, maybe crushing ones that are larger in size. Pt can likely take per her preference given that she was pretty sensate to difficulty on MBS and can verbalize precautions to take if she has trouble clinically. Factors that may increase risk of adverse event in presence of aspiration Rubye Oaks & [name] 2021): Factors  PTV      Full                                                        +---------+---------------+---------+-----------+----------+--------------+ PERO     Full                                                        +---------+---------------+---------+-----------+----------+--------------+     Summary: RIGHT: - There is no evidence of deep vein thrombosis in the lower extremity. However, portions of this examination were limited- see technologist comments above.  - No cystic structure found in the popliteal fossa.  LEFT: - There is no evidence of deep vein thrombosis in the lower extremity. However, portions of this examination were limited- see technologist comments above.  - No cystic structure found in the popliteal fossa.  *See table(s) above for measurements and observations. Electronically signed by Gerarda Fraction on 12/21/2022 at 2:58:08 PM.    Final     ECHOCARDIOGRAM LIMITED  Result Date: 12/20/2022    ECHOCARDIOGRAM LIMITED REPORT   Patient Name:   [name] Date of Exam: 12/20/2022 Medical Rec #:  [id number]        Height:       64.0 in Accession #:    [id number]       Weight:       140.4 lb Date of Birth:  1976/03/18        BSA:          1.683 m Patient Age:    47 years         BP:           170/111 mmHg Patient Gender: F                HR:           120 bpm. Exam Location:  Inpatient Procedure: Cardiac Doppler, Limited Echo and Limited Color Doppler Indications:    Pulmonary Embolus I26.09  History:        Patient has prior history of Echocardiogram examinations, most                 recent 12/15/2022. Breast Cancer, Arrythmias:Cardiac Arrest; Risk                 Factors:Former Smoker.  Sonographer:    Aron Baba Referring Phys: [id number] Cristopher Peru  Sonographer Comments: No apical window and suboptimal subcostal window. Image acquisition challenging due to breast implants. IMPRESSIONS  1. Left ventricular ejection fraction, by estimation, is 60 to 65%. The left ventricle has normal function.  2. Right ventricular systolic function is normal. The right ventricular size is normal. Tricuspid regurgitation signal is inadequate for assessing PA pressure.  3. Aortic valve regurgitation is not visualized.  4. The inferior vena cava is dilated in size with >50% respiratory variability, suggesting right atrial pressure of 8 mmHg. Conclusion(s)/Recommendation(s): No RV strain, no pulmonary hypertension. FINDINGS  Left Ventricle: Left ventricular ejection fraction, by estimation, is 60 to 65%. The left ventricle has normal function. There is no left ventricular hypertrophy. Right Ventricle: The right ventricular size is normal. Right ventricular systolic  PTV      Full                                                        +---------+---------------+---------+-----------+----------+--------------+ PERO     Full                                                        +---------+---------------+---------+-----------+----------+--------------+     Summary: RIGHT: - There is no evidence of deep vein thrombosis in the lower extremity. However, portions of this examination were limited- see technologist comments above.  - No cystic structure found in the popliteal fossa.  LEFT: - There is no evidence of deep vein thrombosis in the lower extremity. However, portions of this examination were limited- see technologist comments above.  - No cystic structure found in the popliteal fossa.  *See table(s) above for measurements and observations. Electronically signed by Gerarda Fraction on 12/21/2022 at 2:58:08 PM.    Final     ECHOCARDIOGRAM LIMITED  Result Date: 12/20/2022    ECHOCARDIOGRAM LIMITED REPORT   Patient Name:   [name] Date of Exam: 12/20/2022 Medical Rec #:  [id number]        Height:       64.0 in Accession #:    [id number]       Weight:       140.4 lb Date of Birth:  1976/03/18        BSA:          1.683 m Patient Age:    47 years         BP:           170/111 mmHg Patient Gender: F                HR:           120 bpm. Exam Location:  Inpatient Procedure: Cardiac Doppler, Limited Echo and Limited Color Doppler Indications:    Pulmonary Embolus I26.09  History:        Patient has prior history of Echocardiogram examinations, most                 recent 12/15/2022. Breast Cancer, Arrythmias:Cardiac Arrest; Risk                 Factors:Former Smoker.  Sonographer:    Aron Baba Referring Phys: [id number] Cristopher Peru  Sonographer Comments: No apical window and suboptimal subcostal window. Image acquisition challenging due to breast implants. IMPRESSIONS  1. Left ventricular ejection fraction, by estimation, is 60 to 65%. The left ventricle has normal function.  2. Right ventricular systolic function is normal. The right ventricular size is normal. Tricuspid regurgitation signal is inadequate for assessing PA pressure.  3. Aortic valve regurgitation is not visualized.  4. The inferior vena cava is dilated in size with >50% respiratory variability, suggesting right atrial pressure of 8 mmHg. Conclusion(s)/Recommendation(s): No RV strain, no pulmonary hypertension. FINDINGS  Left Ventricle: Left ventricular ejection fraction, by estimation, is 60 to 65%. The left ventricle has normal function. There is no left ventricular hypertrophy. Right Ventricle: The right ventricular size is normal. Right ventricular systolic  PTV      Full                                                        +---------+---------------+---------+-----------+----------+--------------+ PERO     Full                                                        +---------+---------------+---------+-----------+----------+--------------+     Summary: RIGHT: - There is no evidence of deep vein thrombosis in the lower extremity. However, portions of this examination were limited- see technologist comments above.  - No cystic structure found in the popliteal fossa.  LEFT: - There is no evidence of deep vein thrombosis in the lower extremity. However, portions of this examination were limited- see technologist comments above.  - No cystic structure found in the popliteal fossa.  *See table(s) above for measurements and observations. Electronically signed by Gerarda Fraction on 12/21/2022 at 2:58:08 PM.    Final     ECHOCARDIOGRAM LIMITED  Result Date: 12/20/2022    ECHOCARDIOGRAM LIMITED REPORT   Patient Name:   [name] Date of Exam: 12/20/2022 Medical Rec #:  [id number]        Height:       64.0 in Accession #:    [id number]       Weight:       140.4 lb Date of Birth:  1976/03/18        BSA:          1.683 m Patient Age:    47 years         BP:           170/111 mmHg Patient Gender: F                HR:           120 bpm. Exam Location:  Inpatient Procedure: Cardiac Doppler, Limited Echo and Limited Color Doppler Indications:    Pulmonary Embolus I26.09  History:        Patient has prior history of Echocardiogram examinations, most                 recent 12/15/2022. Breast Cancer, Arrythmias:Cardiac Arrest; Risk                 Factors:Former Smoker.  Sonographer:    Aron Baba Referring Phys: [id number] Cristopher Peru  Sonographer Comments: No apical window and suboptimal subcostal window. Image acquisition challenging due to breast implants. IMPRESSIONS  1. Left ventricular ejection fraction, by estimation, is 60 to 65%. The left ventricle has normal function.  2. Right ventricular systolic function is normal. The right ventricular size is normal. Tricuspid regurgitation signal is inadequate for assessing PA pressure.  3. Aortic valve regurgitation is not visualized.  4. The inferior vena cava is dilated in size with >50% respiratory variability, suggesting right atrial pressure of 8 mmHg. Conclusion(s)/Recommendation(s): No RV strain, no pulmonary hypertension. FINDINGS  Left Ventricle: Left ventricular ejection fraction, by estimation, is 60 to 65%. The left ventricle has normal function. There is no left ventricular hypertrophy. Right Ventricle: The right ventricular size is normal. Right ventricular systolic  positioned at the superior caval-atrial junction. Final catheter positioning was confirmed and documented with a spot radiographic image. The catheter aspirates and flushes normally. The catheter was flushed with appropriate volume heparin dwells. The catheter exit site was secured with a 2-0 Ethilon retention suture. The venotomy incision was closed with Dermabond. Dressings were applied. The patient tolerated the procedure well without immediate post procedural complication. FINDINGS: After catheter placement, the tip lies within the superior apect of the right atrium. The catheter aspirates and flushes normally and is ready for immediate use. IMPRESSION: Successful placement of a 20 cm dual lumen tunneled central venous "Powerline" catheter via the  RIGHT internal jugular vein The tip of the catheter is positioned within the proximal RIGHT atrium. The catheter is ready for immediate use. Roanna Banning, MD [facility] Radiology Electronically Signed   By: Roanna Banning M.D.   On: 01/09/2023 16:16   [place] EKG SITE RITE  Result Date: 01/08/2023 If Site Rite image not attached, placement could not be confirmed due to current cardiac rhythm.  DG CHEST PORT 1 VIEW  Result Date: 01/03/2023 CLINICAL DATA:  10031 Cough 786.2.ICD-9-CM [id number] Fever [id number] EXAM: PORTABLE CHEST 1 VIEW COMPARISON:  None Available. FINDINGS: The heart size and mediastinal contours are within normal limits. Left IJ approach Port-A-Cath with the tip projecting near the superior cavoatrial junction. Right subclavian approach central venous catheter with its tip near the superior cavoatrial junction also. Enteric tube courses below the diaphragm with the tip in the third portion of the duodenum. Improved aeration of the lung bases within minimal streaky left basilar opacities. No visible pleural effusions or pneumothorax. No acute osseous abnormality. Left breast implant. Improved aeration lung IMPRESSION: Bases with minimal streaky left basilar opacities. Electronically Signed   By: Feliberto Harts M.D.   On: 01/03/2023 14:31   DG Swallowing Func-Speech Pathology  Result Date: 01/02/2023 Table formatting from the original result was not included. Modified Barium Swallow Study Patient Details Name: [name] MRN: [id number] Date of Birth: 1975-12-17 Today's Date: 12/29/2022 HPI/PMH: HPI: Per CCM note "47 year old female with past medical history of recurrent breast cancer not currently on treatment, Crohn's disease s/p subtotal colectomy in 2021 recurrent obstructions, colonic stricture, IDA, recent hospitalization 8/10-8/15/2024 for obstruction seen by GI and general surgery. Underwent EGD and flexible sigmoidoscopy and discharged on  steroids who presented to the emergency department on 12/12/2022 with abdominal pain, nausea, vomiting diarrhea. In the ED tachycardic, hypertensive. ED provider notes abdominal distention. She was given 2L IVF, morphine, IV PPI. Labs with WBC 10.8, Hgb 10.7, K 3.2. CT AP was ordered and showed markedly dilated distal small bowel consistent with chronic obstruction. No changes since prior study. Patient was admitted to [facility]. They consulted GI for input. Unfortunately, around 0820 9/4 patient was found on the floor in the bathroom in ED pulseless. They estimate downtime around 5 minutes. ACLS was initiated. Concern for possible torsades de pointes and given IV mag, defibrillated. Coded for around 5 minutes. She was intubated. She had copious vomitus in the ETT. ROSC achieved and ICU was consulted. Notably, she had I-stat peri-code which showed hemoglobin of 5.5 down from 10 overnight."  Pt was extubated 12/18/2021.  Pt has been maintained on TPN and is now getting trickle feeds for nutrition. SLP following for dysphagia management. Clinical Impression: Clinical Impression: Today patient presents with improvement in swallow function compared to prior approx six days ago.  Mild pharyngeal dysphagia remains with inconsistent penetration of  Summary: No evidence of deep vein or superficial vein thrombosis involving the right and left upper extremities. Right: However, unable to visualize the subclavian vein.  *See table(s) above for measurements and observations.  Diagnosing physician: Coral Else MD Electronically signed by Coral Else MD on 12/30/2022 at 10:08:06 AM.    Final    DG CHEST PORT 1 VIEW  Result Date: 12/27/2022 CLINICAL DATA:  Fever EXAM: PORTABLE CHEST 1 VIEW COMPARISON:  12/22/2022 FINDINGS: Enteric tube extends below the diaphragm with distal tip terminating within the third portion of the duodenum. Left chest port and right subclavian central venous catheter remain appropriately positioned. Normal heart size. Left greater than right bibasilar opacities with bilateral pleural effusions, slightly improved. No pneumothorax. IMPRESSION: Left greater than right bibasilar opacities with bilateral pleural effusions, slightly improved. Electronically Signed   By: Duanne Guess D.O.   On: 12/27/2022 13:43   VAS [place] LOWER EXTREMITY VENOUS  (DVT)  Result Date: 12/27/2022  Lower Venous DVT Study Patient Name:  [name]  Date of Exam:   12/27/2022 Medical Rec #: [id number]         Accession #:    [id number] Date of Birth: 1975/11/15         Patient Gender: F Patient Age:   47 years Exam Location:  [facility] Procedure:      VAS [place] LOWER EXTREMITY VENOUS (DVT) Referring Phys: MICHAEL MACZIS --------------------------------------------------------------------------------  Indications: Pulmonary embolism.  Risk Factors: Confirmed PE. Limitations: Poor ultrasound/tissue interface, bandages, open wound and patient positioning. Comparison Study: 12/14/2022, 12/20/2022 - Negative for DVT. Performing Technologist: Chanda Busing RVT  Examination Guidelines: A complete evaluation includes B-mode imaging, spectral Doppler, color Doppler, and power Doppler as needed of all accessible portions of each vessel. Bilateral testing is considered an integral part of a complete examination. Limited examinations for reoccurring indications may be performed as noted. The reflux portion of the exam is performed with the patient in reverse Trendelenburg.  +---------+---------------+---------+-----------+----------+--------------+ RIGHT    CompressibilityPhasicitySpontaneityPropertiesThrombus Aging +---------+---------------+---------+-----------+----------+--------------+ CFV      Full           Yes      Yes                                 +---------+---------------+---------+-----------+----------+--------------+ SFJ      Full                                                        +---------+---------------+---------+-----------+----------+--------------+ FV Prox  Full                                                        +---------+---------------+---------+-----------+----------+--------------+ FV Mid                  Yes      Yes                                  +---------+---------------+---------+-----------+----------+--------------+ FV DistalFull                                                        +---------+---------------+---------+-----------+----------+--------------+  lung base. EXAM: PORTABLE CHEST 1 VIEW COMPARISON:  12/19/2022. FINDINGS: The heart size and mediastinal contours are stable. A left chest port and right central venous catheter terminates over the superior vena cava. Hazy opacities are noted in the mid to lower lung fields bilaterally suggesting moderate layering pleural effusions. Airspace disease is noted at the lung bases. No pneumothorax. An enteric tube terminates in the stomach. IMPRESSION: Hazy opacities in the mid to lower lung fields bilaterally likely reflecting moderate layering pleural effusions with atelectasis or infiltrate. Electronically Signed   By: Thornell Sartorius M.D.   On: 12/22/2022 04:45   VAS [place] LOWER EXTREMITY VENOUS (DVT)  Result Date: 12/21/2022  Lower Venous DVT Study Patient Name:  GAYNOR GENCO  Date of Exam:   12/20/2022 Medical Rec #: [id number]         Accession #:    [id number] Date of Birth: 1975-06-08         Patient Gender: F Patient Age:   13 years Exam Location:  [facility] Procedure:      VAS [place] LOWER EXTREMITY VENOUS (DVT) Referring Phys: Mertha Baars --------------------------------------------------------------------------------  Indications: Pulmonary embolism.  Risk Factors: Cancer. Limitations: Poor ultrasound/tissue interface, bandages and patient positioning, patient immobility. Comparison Study: 12/14/2022 - Negative for DVT. Performing Technologist: Chanda Busing RVT  Examination Guidelines: A complete evaluation includes B-mode imaging, spectral Doppler, color Doppler, and power Doppler as needed of all accessible portions of each vessel. Bilateral testing is considered an integral part of a complete examination. Limited examinations for reoccurring indications may  be performed as noted. The reflux portion of the exam is performed with the patient in reverse Trendelenburg.  +---------+---------------+---------+-----------+----------+--------------+ RIGHT    CompressibilityPhasicitySpontaneityPropertiesThrombus Aging +---------+---------------+---------+-----------+----------+--------------+ CFV      Full           Yes      Yes                                 +---------+---------------+---------+-----------+----------+--------------+ SFJ      Full                                                        +---------+---------------+---------+-----------+----------+--------------+ FV Prox  Full                                                        +---------+---------------+---------+-----------+----------+--------------+ FV Mid                  Yes      Yes                                 +---------+---------------+---------+-----------+----------+--------------+ FV Distal               Yes      Yes                                 +---------+---------------+---------+-----------+----------+--------------+ PFV      Full                                                        +---------+---------------+---------+-----------+----------+--------------+  Summary: No evidence of deep vein or superficial vein thrombosis involving the right and left upper extremities. Right: However, unable to visualize the subclavian vein.  *See table(s) above for measurements and observations.  Diagnosing physician: Coral Else MD Electronically signed by Coral Else MD on 12/30/2022 at 10:08:06 AM.    Final    DG CHEST PORT 1 VIEW  Result Date: 12/27/2022 CLINICAL DATA:  Fever EXAM: PORTABLE CHEST 1 VIEW COMPARISON:  12/22/2022 FINDINGS: Enteric tube extends below the diaphragm with distal tip terminating within the third portion of the duodenum. Left chest port and right subclavian central venous catheter remain appropriately positioned. Normal heart size. Left greater than right bibasilar opacities with bilateral pleural effusions, slightly improved. No pneumothorax. IMPRESSION: Left greater than right bibasilar opacities with bilateral pleural effusions, slightly improved. Electronically Signed   By: Duanne Guess D.O.   On: 12/27/2022 13:43   VAS [place] LOWER EXTREMITY VENOUS  (DVT)  Result Date: 12/27/2022  Lower Venous DVT Study Patient Name:  [name]  Date of Exam:   12/27/2022 Medical Rec #: [id number]         Accession #:    [id number] Date of Birth: 1975/11/15         Patient Gender: F Patient Age:   47 years Exam Location:  [facility] Procedure:      VAS [place] LOWER EXTREMITY VENOUS (DVT) Referring Phys: MICHAEL MACZIS --------------------------------------------------------------------------------  Indications: Pulmonary embolism.  Risk Factors: Confirmed PE. Limitations: Poor ultrasound/tissue interface, bandages, open wound and patient positioning. Comparison Study: 12/14/2022, 12/20/2022 - Negative for DVT. Performing Technologist: Chanda Busing RVT  Examination Guidelines: A complete evaluation includes B-mode imaging, spectral Doppler, color Doppler, and power Doppler as needed of all accessible portions of each vessel. Bilateral testing is considered an integral part of a complete examination. Limited examinations for reoccurring indications may be performed as noted. The reflux portion of the exam is performed with the patient in reverse Trendelenburg.  +---------+---------------+---------+-----------+----------+--------------+ RIGHT    CompressibilityPhasicitySpontaneityPropertiesThrombus Aging +---------+---------------+---------+-----------+----------+--------------+ CFV      Full           Yes      Yes                                 +---------+---------------+---------+-----------+----------+--------------+ SFJ      Full                                                        +---------+---------------+---------+-----------+----------+--------------+ FV Prox  Full                                                        +---------+---------------+---------+-----------+----------+--------------+ FV Mid                  Yes      Yes                                  +---------+---------------+---------+-----------+----------+--------------+ FV DistalFull                                                        +---------+---------------+---------+-----------+----------+--------------+

## 2023-01-16 NOTE — TOC Transition Note (Signed)
Transition of Care [facility]) - CM/SW Discharge Note   Patient Details  Name: [name] MRN: [id number] Date of Birth: 1976-04-03  Transition of Care [facility]) CM/SW Contact:  Howell Rucks, RN Phone Number: 01/16/2023, 1:41 PM   Clinical Narrative: DC order to [facility], RM [address], nurse call report [phone]. MD to MD report to Dr Lum Keas at [phone] , attending notified. PTAR called for transportation. No further TOC needs identified.        Final next level of care: Long Term Acute Care (LTAC) Barriers to Discharge: Barriers Resolved   Patient Goals and CMS Choice CMS Medicare.gov Compare Post Acute Care list provided to:: Patient Represenative (must comment) (Coone,TRINITY (Daughter)  [phone] (Mobile) Choice offered to / list presented to : Adult Children  Discharge Placement                Patient chooses bed at:  ([facility]) Patient to be transferred to facility by: PTAR Name of family member notified: JAIDYNN, BALSTER (Daughter)  [phone] (Mobile Patient and family notified of of transfer: 01/16/23  Discharge Plan and Services Additional resources added to the After Visit Summary for   In-house Referral: Hospice / Palliative Care Discharge Planning Services: CM Consult Post Acute Care Choice: NA          DME Arranged: N/A DME Agency: NA       HH Arranged: NA HH Agency: NA        Social Determinants of Health (SDOH) Interventions SDOH Screenings   Food Insecurity: No Food Insecurity (11/21/2022)  Housing: Patient Unable To Answer (11/21/2022)  Transportation Needs: No Transportation Needs (11/21/2022)  Utilities: Not At Risk (11/21/2022)  Tobacco Use: Medium Risk (01/05/2023)     Readmission Risk Interventions    12/20/2022    3:50 PM 12/15/2022    2:43 PM  Readmission Risk Prevention Plan  Transportation Screening Complete Complete  PCP or Specialist Appt within 3-5 Days  Complete  HRI or Home Care Consult  Complete  Social  Work Consult for Recovery Care Planning/Counseling  Complete  Palliative Care Screening  Complete  Medication Review Oceanographer) Complete Complete  PCP or Specialist appointment within 3-5 days of discharge Complete   HRI or Home Care Consult Complete   SW Recovery Care/Counseling Consult Complete   Palliative Care Screening Complete   Skilled Nursing Facility Not Applicable

## 2023-01-16 NOTE — Progress Notes (Addendum)
Report given to Dorene Sorrow in [facility], all questions answered. Pt to be d/c with JP drain and CVC

## 2023-01-16 NOTE — Progress Notes (Signed)
Occupational Therapy Treatment Patient Details Name: [name] MRN: [id number] DOB: 10-May-1975 Today's Date: 01/16/2023   History of present illness Patient is a 47 year old female who presented on 9/3 with abdominal distension and nausea/vomiting. 9/4 patient was found in ED bathroom with no pulse with ROSC in about 5 mins. Patient was intubated at this time. 9/5 patient underwent exploratory laparotomy that revealed internal hernia with associated small bowel ischemia with patient undergoing bowel resection. 9/7 patient returned to OR for ostomy placement. Patient was extubated on 9/10 and also found to have PE. PMH: breast cancer, Chron's disease, IDA, recent hospitalization 8/10 to 8/15 with bowel obstruction and EGD   OT comments  Patient was noted to have made progress with patient able to progress to using bathroom with CGA with no LOB. Patient is motivated and eager to be able to transition home after LTAC. Patient has stronger voice today and has made progress towards goals. Patient's discharge plan remains appropriate at this time. OT will continue to follow acutely.        If plan is discharge home, recommend the following:  Direct supervision/assist for medications management;Assistance with cooking/housework;Assist for transportation;Help with stairs or ramp for entrance;A lot of help with bathing/dressing/bathroom;A little help with walking and/or transfers   Equipment Recommendations  None recommended by OT       Precautions / Restrictions Precautions Precautions: Fall Precaution Comments: ABD precautions, ostomy, nasal tube Restrictions Weight Bearing Restrictions: No Other Position/Activity Restrictions: abdominal precautions, ostomy       Mobility Bed Mobility Overal bed mobility: Needs Assistance       Supine to sit: Supervision                   ADL either performed or assessed with clinical judgement   ADL Overall ADL's : Needs  assistance/impaired                         Toilet Transfer: Contact guard assist;Ambulation;Rolling walker (2 wheels) Toilet Transfer Details (indicate cue type and reason): to bathroom Toileting- Clothing Manipulation and Hygiene: Supervision/safety;Sit to/from stand Toileting - Clothing Manipulation Details (indicate cue type and reason): with increased time       General ADL Comments: patient participate in functional mobility in hallway with CGA and HR up to 130s with movement but patietn having no symptoms. patient plans to d/c to LTAC today      Cognition Arousal: Alert Behavior During Therapy: WFL for tasks assessed/performed Overall Cognitive Status: Within Functional Limits for tasks assessed             General Comments: patient has a voice today and is motivated to participate                   Pertinent Vitals/ Pain       Pain Assessment Pain Assessment: Faces Faces Pain Scale: Hurts a little bit Pain Location: abdomen RLQ near drain site Pain Descriptors / Indicators: Grimacing, Discomfort Pain Intervention(s): Limited activity within patient's tolerance, Monitored during session         Frequency  Min 1X/week        Progress Toward Goals  OT Goals(current goals can now be found in the care plan section)  Progress towards OT goals: Progressing toward goals     Plan         AM-PAC OT "6 Clicks" Daily Activity     Outcome Measure   Help from another  person eating meals?: A Little Help from another person taking care of personal grooming?: A Little Help from another person toileting, which includes using toliet, bedpan, or urinal?: A Little Help from another person bathing (including washing, rinsing, drying)?: A Lot Help from another person to put on and taking off regular upper body clothing?: A Little Help from another person to put on and taking off regular lower body clothing?: A Lot 6 Click Score: 16    End of Session  Equipment Utilized During Treatment: Rolling walker (2 wheels)  OT Visit Diagnosis: Unsteadiness on feet (R26.81);Pain;Muscle weakness (generalized) (M62.81)   Activity Tolerance Patient tolerated treatment well   Patient Left in chair;with call bell/phone within reach;with chair alarm set   Nurse Communication Mobility status        Time: 1610-9604 OT Time Calculation (min): 28 min  Charges: OT General Charges $OT Visit: 1 Visit OT Treatments $Self Care/Home Management : 23-37 mins  Rosalio Loud, MS Acute Rehabilitation Department Office# [phone]   Selinda Flavin 01/16/2023, 12:43 PM

## 2023-01-16 NOTE — Progress Notes (Addendum)
11 Days Post-Op   Subjective/Chief Complaint: Ileostomy output thinner this am. Happy to be on regular diet and eating toast this am No abdominal pain/n/v  Objective: Vital signs in last 24 hours: Temp:  [97.5 F (36.4 C)-98.9 F (37.2 C)] 98.2 F (36.8 C) (10/08 0446) Pulse Rate:  [102-128] 102 (10/08 0446) Resp:  [16-19] 18 (10/08 0446) BP: (97-128)/(57-82) 97/57 (10/08 0446) SpO2:  [100 %] 100 % (10/08 0446) Weight:  [49.9 kg] 49.9 kg (10/08 0456) Last BM Date : 01/15/23  Intake/Output from previous day: 10/07 0701 - 10/08 0700 In: 2981.7 [P.O.:480; I.V.:1514.4; Blood:315; NG/GT:100; IV Piggyback:572.4] Out: 1426 [Urine:51; Drains:25; Stool:1350] Intake/Output this shift: No intake/output data recorded.  WDWN in NAD Abd - soft, minimal tenderness Wound - soft, NT, ND. Midline bandage cdi.  Ostomy - copious thin output Drain - clear serosanguinous fluid  Lab Results:  Recent Labs    01/15/23 0313 01/16/23 0514  WBC 16.1* 14.4*  HGB 7.1* 8.5*  HCT 23.0* 26.6*  PLT 397 419*   BMET Recent Labs    01/15/23 0313  NA 136  K 4.1  CL 103  CO2 26  GLUCOSE 134*  BUN 31*  CREATININE 0.66  CALCIUM 8.3*   PT/INR No results for input(s): "LABPROT", "INR" in the last 72 hours.  ABG No results for input(s): "PHART", "HCO3" in the last 72 hours.  Invalid input(s): "PCO2", "PO2"  Studies/Results: No results found.  Anti-infectives: Anti-infectives (From admission, onward)    Start     Dose/Rate Route Frequency Ordered Stop   01/15/23 1700  fluconazole (DIFLUCAN) IVPB 200 mg        200 mg 100 mL/hr over 60 Minutes Intravenous  Once 01/15/23 1552     01/12/23 0000  ciprofloxacin (CIPRO) 400 MG/200ML SOLN        400 mg Intravenous 2 times daily 01/12/23 1359     01/12/23 0000  metroNIDAZOLE (FLAGYL) 500 MG/100ML        500 mg Intravenous 2 times daily 01/12/23 1359     12/29/22 1215  ciprofloxacin (CIPRO) IVPB 400 mg        400 mg 200 mL/hr over 60 Minutes  Intravenous 2 times daily 12/29/22 1123     12/29/22 1215  metroNIDAZOLE (FLAGYL) IVPB 500 mg        500 mg 100 mL/hr over 60 Minutes Intravenous 2 times daily 12/29/22 1123     12/28/22 1430  micafungin (MYCAMINE) 100 mg in sodium chloride 0.9 % 100 mL IVPB  Status:  Discontinued        100 mg 105 mL/hr over 1 Hours Intravenous Every 24 hours 12/28/22 1335 01/05/23 0731   12/23/22 2000  vancomycin (VANCOREADY) IVPB 1250 mg/250 mL  Status:  Discontinued        1,250 mg 166.7 mL/hr over 90 Minutes Intravenous Every 36 hours 12/22/22 0612 12/22/22 1103   12/22/22 0645  vancomycin (VANCOREADY) IVPB 1250 mg/250 mL        1,250 mg 166.7 mL/hr over 90 Minutes Intravenous  Once 12/22/22 0551 12/22/22 0805   12/22/22 0600  piperacillin-tazobactam (ZOSYN) IVPB 3.375 g  Status:  Discontinued        3.375 g 12.5 mL/hr over 240 Minutes Intravenous Every 8 hours 12/22/22 0551 12/29/22 1123   12/13/22 1000  piperacillin-tazobactam (ZOSYN) IVPB 3.375 g  Status:  Discontinued        3.375 g 12.5 mL/hr over 240 Minutes Intravenous Every 8 hours 12/13/22 0833 12/20/22 1333  Assessment/Plan:  Hx Crohn's disease s/p subtotal colectomy   Internal hernia with bowel ischemia - POD 31 exploratory laparotomy with small bowel resection 9/5 Dr. Hillery Hunter - POD 29  s/p exploratory laparotomy, ileostomy, abdominal closure 9/7 Dr. 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 Highly likely to have high ostomy output              - added Paregoric (tincture of opium) to try to slow gut motility. Overall stable today 1200 to 1350 ml/24h - s/p PAC removal 9/27 for fever of unknown origin. -  Pre-alb 11 and Albumin < 1.5 on 9/19 - Imodium 4mg  QID, Iron 300mg  BID, Fibercon 625mg  TID. lomotil 4 mg QID. Tincture of opium - added 10/5 - new CVC placed by IR 10/1 >> TPN, discontinue tubefeeds - Diet per SLP - passed for regular. poor PO intake, PO supplements  per dietician. - WOCN following for new ostomy. Plan ostomy clinic referral at d/c - VAC removed 10/4, daily WTD. Healing very well - CT A/P 9/16 done. No indication for return to the OR. Low suspicion for bowel ischemia currently. Ascites without signs of infection on para. Lactate 9/25 WNL. Central line removed 9/26. Port removed 9/27. WBC down trending. Fevers resolved. CT AP 10/2  with large collection - IR drain placed  10/3 - no organisms on gram stain, ?seroma. No growth on culture - ABx and anti-fungals per ID - PT/OT - Palliative following peripherally currently.      ID - Zosyn 9/4>>9/11. 9/13 >> Micafungin 9/19 >> fevers resolved since port removal.  LE [place] 9/18 neg. UE [place] 9/19 neg. UA neg. CXR w/ L > R opacities with b/l pleural effusions. Resp Cx NGTD. Bcx including specific fungal blood cx NGTD. PAC out 9/27- WBC improving.    FEN - diet per SLP - advanced to reg, TPN VTE - SCDs, Hep gtt  Foley - Failed TOV. Foley out 9/27. voiding.   Dispo: continue to trend WBC, improving. Monitor ileostomy output. Reach out to IR regarding drain removal (25 ml last 24 h)     - per TRH - PE - Noted on CTA 9/10. LE [place] negative for DVT. Heparin gtt. Hgb 7.7, continue to monitor. Agree with CCM that we may need to consider IVC temporary filter at some point if unable to restart heparin gtt (hx breast ca, recent surgery, immobilized). Shock - off pressors VDRF - extubated  Recent cardiac arrest 9/3 AKI - Cr stable on most recent labs Anemia - Hgb slightly increased today.  Transfuse to keep hemoglobin above 7. Consider IV iron supplementation - she has short gut so worried she will not absorb iron well orally. Last PT-INR 9/15 was 1.3.  Hx metastatic breast cancer     LOS: 34 days    Eric Form, [facility] Surgery 01/16/2023, 9:01 AM Please see Amion for pager number during day hours 7:00am-4:30pm

## 2023-01-16 NOTE — Progress Notes (Signed)
Referring Physician(s):   Supervising Physician: Mugweru,J  Patient Status:    Chief Complaint:  Abdominal pain/post op fluid collection  Subjective: Pt doing ok; awaiting dc to [facility] today; has some mild RLQ discomfort at drain site; no fevers,N/V   Allergies: Nsaids, Chlorhexidine gluconate, Hydromorphone, and Lactose intolerance (gi)  Medications: Prior to Admission medications   Medication Sig Start Date End Date Taking? Authorizing Provider  acetaminophen (TYLENOL) 500 MG tablet Take 500-1,000 mg by mouth every 6 (six) hours as needed for mild pain.   Yes [provider]  cholecalciferol (VITAMIN D3) 25 MCG (1000 UNIT) tablet Take 1,000 Units by mouth daily.   Yes [provider]  hyoscyamine (LEVSIN SL) 0.125 MG SL tablet Place 1 tablet (0.125 mg total) under the tongue every 6 (six) hours as needed for cramping. 11/23/22  Yes Standley Brooking, MD  Multiple Vitamins-Minerals (MULTIVITAMIN WITH MINERALS) tablet Take 1 tablet by mouth daily.   Yes [provider]  nystatin (MYCOSTATIN) 100000 UNIT/ML suspension Take 5 mLs (500,000 Units total) by mouth 4 (four) times daily. 11/23/22  Yes Standley Brooking, MD  potassium chloride SA (KLOR-CON M) 20 MEQ tablet Take 1 tablet (20 mEq total) by mouth daily. 12/27/21  Yes Iva Boop, MD  simethicone (MYLICON) 80 MG chewable tablet Chew 1 tablet (80 mg total) by mouth 4 (four) times daily as needed for flatulence. 11/23/22  Yes Standley Brooking, MD  albuterol (PROVENTIL) (2.5 MG/3ML) 0.083% nebulizer solution Take 3 mLs (2.5 mg total) by nebulization every 4 (four) hours as needed for wheezing or shortness of breath. 01/12/23   Kathlen Mody, MD  ALPRAZolam Prudy Feeler) 0.25 MG tablet Take 1 tablet (0.25 mg total) by mouth 2 (two) times daily. 01/12/23   Kathlen Mody, MD  artificial tears (LACRILUBE) OINT ophthalmic ointment Place into both eyes every 4 (four) hours as needed for dry eyes. 01/12/23    Kathlen Mody, MD  ascorbic acid (VITAMIN C) 500 MG tablet Take 1 tablet (500 mg total) by mouth 2 (two) times daily. 01/12/23   Kathlen Mody, MD  ciprofloxacin (CIPRO) 400 MG/200ML SOLN Inject 200 mLs (400 mg total) into the vein 2 (two) times daily for 26 days. 01/16/23 02/11/23  Kathlen Mody, MD  diphenoxylate-atropine (LOMOTIL) 2.5-0.025 MG tablet Take 2 tablets by mouth 4 (four) times daily. 01/12/23   Kathlen Mody, MD  enoxaparin (LOVENOX) 60 MG/0.6ML injection Inject 0.5 mLs (50 mg total) into the skin every 12 (twelve) hours. 01/16/23   Kathlen Mody, MD  famotidine (PEPCID) 40 MG/5ML suspension Take 2.5 mLs (20 mg total) by mouth daily. 01/13/23   Kathlen Mody, MD  ferrous sulfate 300 (60 Fe) MG/5ML syrup Take 5 mLs (300 mg total) by mouth 2 (two) times daily. 01/16/23   Kathlen Mody, MD  fiber (NUTRISOURCE FIBER) PACK packet Take 1 packet by mouth 3 (three) times daily. 01/12/23   Kathlen Mody, MD  food thickener (SIMPLYTHICK, NECTAR/LEVEL 2/MILDLY THICK,) GEL Take 1 packet by mouth as needed. 01/12/23   Kathlen Mody, MD  gabapentin (NEURONTIN) 250 MG/5ML solution Take 4 mLs (200 mg total) by mouth every 8 (eight) hours. 01/12/23   Kathlen Mody, MD  insulin aspart (NOVOLOG) 100 UNIT/ML injection Inject 0-9 Units into the skin 3 (three) times daily with meals. 01/12/23   Kathlen Mody, MD  lip balm (CARMEX) ointment Apply topically as needed. 01/12/23   Kathlen Mody, MD  loperamide HCl (IMODIUM) 1 MG/7.5ML suspension Take 30 mLs (4 mg total)  by mouth 4 (four) times daily. 01/12/23   Kathlen Mody, MD  metoprolol tartrate (LOPRESSOR) 50 MG tablet Take 0.5 tablets (25 mg total) by mouth 2 (two) times daily. 01/16/23   Kathlen Mody, MD  metroNIDAZOLE (FLAGYL) 500 MG/100ML Inject 100 mLs (500 mg total) into the vein 2 (two) times daily for 26 days. 01/16/23 02/11/23  Kathlen Mody, MD  Multiple Vitamin (MULTIVITAMIN) LIQD Place 15 mLs into feeding tube daily. 01/17/23   Kathlen Mody, MD  Nutritional  Supplements (FEEDING SUPPLEMENT, KATE FARMS STANDARD 1.4,) LIQD liquid Take 325 mLs by mouth 2 (two) times daily between meals. 01/12/23   Kathlen Mody, MD  Nutritional Supplements (FEEDING SUPPLEMENT, KATE FARMS STANDARD 1.4,) LIQD liquid Take 360 mLs by mouth daily. 01/12/23   Kathlen Mody, MD  Nutritional Supplements (FEEDING SUPPLEMENT, KATE FARMS STANDARD 1.4,) LIQD liquid Take 325 mLs by mouth 2 (two) times daily between meals. 01/16/23   Kathlen Mody, MD  nystatin (MYCOSTATIN) 100000 UNIT/ML suspension Take 5 mLs (500,000 Units total) by mouth 4 (four) times daily. 01/16/23   Kathlen Mody, MD  Opium 10 MG/ML (1%) TINC Place 1 mL (10 mg total) into feeding tube 4 (four) times daily. 01/16/23   Kathlen Mody, MD  oxyCODONE (OXY IR/ROXICODONE) 5 MG immediate release tablet Take 1-2 tablets (5-10 mg total) by mouth every 4 (four) hours as needed for breakthrough pain. 01/12/23   Kathlen Mody, MD  Vitamin D, Ergocalciferol, (DRISDOL) 1.25 MG (50000 UNIT) CAPS capsule Take 1 capsule (50,000 Units total) by mouth every 7 (seven) days. 01/18/23   Kathlen Mody, MD     Vital Signs: BP 94/66 (BP Location: Left Arm)   Pulse (!) 119   Temp 97.9 F (36.6 C) (Oral)   Resp 18   Ht 5\' 4"  (1.626 m)   Wt 110 lb 0.2 oz (49.9 kg)   LMP 04/24/2021   SpO2 100%   BMI 18.88 kg/m   Physical Exam awake/alert; RLQ drain intact, insertion site ok, site mildly tender, OP about 10 cc serosang fluid in bulb, about 25 cc last 24 hrs  Imaging: No results found.  Labs:  CBC: Recent Labs    01/13/23 0306 01/14/23 0257 01/15/23 0313 01/16/23 0514  WBC 23.7* 18.2* 16.1* 14.4*  HGB 7.7* 7.5* 7.1* 8.5*  HCT 24.8* 24.6* 23.0* 26.6*  PLT 414* 414* 397 419*    COAGS: Recent Labs    09/28/22 0350 11/20/22 0252 12/22/22 1343 12/23/22 0520 12/24/22 0408 12/30/22 1338 01/11/23 1143  INR 1.0   < > 1.3* 1.3* 1.3* 1.3* 1.2  APTT 28  --  33  --  36 38*  --    < > = values in this interval not displayed.     BMP: Recent Labs    01/11/23 0357 01/12/23 0601 01/13/23 0306 01/15/23 0313  NA 131* 132* 131* 136  K 3.9 4.7 4.2 4.1  CL 99 102 98 103  CO2 23 23 24 26   GLUCOSE 152* 166* 152* 134*  BUN 17 30* 28* 31*  CALCIUM 8.5* 8.3* 8.3* 8.3*  CREATININE 0.76 0.72 0.71 0.66  GFRNONAA >60 >60 >60 >60    LIVER FUNCTION TESTS: Recent Labs    01/08/23 0541 01/11/23 0357 01/12/23 0601 01/15/23 0313  BILITOT 0.5 0.2* 0.4 0.3  AST 52* 32 28 43*  ALT 32 22 21 29   ALKPHOS 156* 122 113 104  PROT 6.5 6.9 6.7 6.4*  ALBUMIN 1.8* 2.0* 2.1* 2.0*    Assessment and Plan: Hx Crohn's  disease s/p subtotal colectomy   Internal hernia with bowel ischemia - POD 31 exploratory laparotomy with small bowel resection 9/5 Dr. Hillery Hunter - POD 29  s/p exploratory laparotomy, ileostomy, abdominal closure 9/7 Dr. Hillery Hunter S/p RLQ abdominal fluid collection drain 10/3; afebrile, WBC 14.4(16.1), hgb 8.5, drain fl cx neg; as outpt rec once daily flush of drain with 5 cc sterile saline, output recording, gauze dressing changes every 1-2 days; pt will be scheduled for f/u CT/poss drain injection in 7-10 days   Electronically Signed: D. Jeananne Rama, PA-C 01/16/2023, 2:00 PM   I spent a total of  at the the patient's bedside AND on the patient's hospital floor or unit, greater than 50% of which was counseling/coordinating care for right abdominal fluid collection drain    Patient ID: [name], female   DOB: July 16, 1975, 47 y.o.   MRN: [id number]

## 2023-01-16 NOTE — Progress Notes (Signed)
Patient discharged: with PTAR  Via: PTAR  IV and telemetry disconnected  Belongings given to patient

## 2023-01-16 NOTE — Progress Notes (Addendum)
PHARMACY - TOTAL PARENTERAL NUTRITION CONSULT NOTE   Indication: Short bowel syndrome  Patient Measurements: Height: 5\' 4"  (162.6 cm) Weight: 49.9 kg (110 lb 0.2 oz) IBW/kg (Calculated) : 54.7 TPN AdjBW (KG): 57.8 Body mass index is 18.88 kg/m. Usual Weight: 46.3 kg from 8/28  Assessment: 47 y.o. female with PMH of recurrent breast cancer not currently on treatment, Crohn's disease s/p subtotal colectomy in 2021, recurrent obstructions, colonic stricture, recent hospitalization 8/10-8/15/2024 for obstruction who presented on 12/12/2022 with abdominal pain, nausea, vomiting, diarrhea.    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 9/26 Line holiday for persistent fevers, elevated WBC 9/30 Ok to place new PICC, 10/1 line placed and TPN resumed  Glucose / Insulin: no Hx DM -Steroids PTA for Crohn's disease; weaned off as of 9/13 -CBGs & SSI stopped 10/7 Electrolytes: all WNL inclucing Corrected Ca on 10/7 Renal: WNL, BUN elevated at 31 on 10/7 Hepatic: LFTs WNL. Albumin low, TG 259 on 10/7 I/O:  - UOP: 51 mls. X 1 occurrence -Ileostomy 1350  mL; Drain 25 mL;  GI Imaging:  -CTA 9/10 shows new PE, but also abdominal ascites -CT Abd: Large volume ascites GI Surgeries / Procedures:  -OR 9/5 ex lap, internal hernia w/ small bowel ischemia, SBR & celiotomy; > 105 cm small bowel remaining -OR 9/7 for closure, ostomy creation -9/17: Paracentesis -9/23 paracentesis   Central access: powerline 10/1 Triple lumen CVC R subclavian 9/6, PAC 05/02/22 (REMOVED) TPN start date: 12/17/22 Held 9/26-9/30 for line holiday Resumed 10/1  Nutritional Goals: Cyclic TPN (volume 1,680 mL) provides 94 g  protein and 1680 kcal per day  RD Assessment: Estimated Needs Total Energy Estimated Needs: 1600-1800 Total Protein Estimated Needs: 85-95 grams Total Fluid Estimated Needs: 1.6L/day  Current Nutrition:  Cyclic TPN (18:00-06:00) Diet: regular [employer] 1.4 325 mls orally BID, each supplement provides 455 kcal and 20 grams protein   Plan: -Continue cyclic TPN over 12 hr (18:00- 06:00) -Electrolytes in TPN: Na 150 mEq/L, K 40 mEq/L, Ca 5 mEq/L, Mg 10 mEq/L, Phos 15 mmol/L. Cl:Ac ratio 1:1 -  MVI per tube -Replacing vit D per dietician recommendation per MD order -Monitor TPN labs on Mon/Thurs  Herby Abraham, Pharm.D Use secure chat for questions 01/16/2023 7:54 AM  Addendum: D/w RN &  TOC CM/SW - pt is going to [facility] today. Will NOT make TPN for today as will be provided by [facility]  Herby Abraham, Pharm.D Use secure chat for questions 01/16/2023 11:33 AM

## 2023-01-19 ENCOUNTER — Inpatient Hospital Stay: Payer: Managed Care, Other (non HMO) | Admitting: Dietician

## 2023-01-19 ENCOUNTER — Inpatient Hospital Stay: Payer: Managed Care, Other (non HMO) | Admitting: Adult Health

## 2023-01-19 NOTE — Progress Notes (Signed)
Patient scheduled for nutrition follow-up today after NP office visit. Patient discharged from 35 day hospital admission on 10/8. Noted office visit cancelled. RD cancelled nutrition appointment. Will plan to reschedule in collaboration with upcoming [facility] appointments.

## 2023-01-22 LAB — CULTURE, FUNGUS WITHOUT SMEAR

## 2023-01-22 NOTE — Progress Notes (Unsigned)
[facility] Cancer Follow up:    Norris, Courtney Knudsen, MD [address] [place] [address]   DIAGNOSIS: Cancer Staging  Malignant neoplasm of upper-outer quadrant of right breast in female, estrogen receptor positive (HCC) Staging form: Breast, AJCC 8th Edition - Clinical: Stage IIIB (cT4d, cN1, cM0, G3, ER+, PR-, HER2+) - Signed by Serena Croissant, MD on 07/07/2020 Histologic grading system: 3 grade system  I connected with [name] on 01/22/23 at  8:30 AM EDT by telephone and verified that I am speaking with the correct person using two identifiers.  I discussed the limitations, risks, security and privacy concerns of performing an evaluation and management service by telephone and the availability of in person appointments.  I also discussed with the patient that there may be a patient responsible charge related to this service. The patient expressed understanding and agreed to proceed.  Patient location: home Provider Location: [facility] office  SUMMARY OF ONCOLOGIC HISTORY: Oncology History  Malignant neoplasm of upper-outer quadrant of right breast in female, estrogen receptor positive (HCC)  06/03/2020 Mammogram   06/03/2020, US guided biopsy of the right breast 9 0 clock mass showed grade III IDC; Prognostics showed ER 40% positive, weak staining, PR positive 0 %, Her 2 3 +, Ki 67 30%   06/04/2020 Initial Diagnosis   [id number] 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 palpable right breast mass measuring about 7 cm x 5 and half centimeters, palpable right axillary lymph node with concern for skin invasion    06/08/2020 Cancer Staging   Staging form: Breast, AJCC 8th Edition - Clinical: Stage IIIB (cT4d, cN1, cM0, G3, ER+, PR-, HER2+) - Signed by Serena Croissant, MD on 07/07/2020 Histologic grading system: 3 grade system   06/24/2020 Genetic Testing   No pathogenic variants detected in [facility] [employer] +RNA[employer]ight.  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  The report date is June 24, 2020.   The CustomNext-Cancer+RNAinsight panel offered by [employer] includes sequencing and rearrangement analysis for the following 47 genes:  APC, ATM, AXIN2, BARD1, BMPR1A, BRCA1, BRCA2, BRIP1, CDH1, CDK4, CDKN2A, CHEK2, DICER1, EPCAM, GREM1, HOXB13, MEN1, MLH1, MSH2, MSH3, MSH6, MUTYH, NBN, NF1, NF2, NTHL1, PALB2, PMS2, POLD1, POLE, PTEN, RAD51C, RAD51D, RECQL, RET, SDHA, SDHAF2, SDHB, SDHC, SDHD, SMAD4, SMARCA4, STK11, TP53, TSC1, TSC2, and VHL.  RNA data is routinely analyzed for use in variant interpretation for all genes.   06/25/2020 - 10/29/2020 Chemotherapy   TCHP x6 cycles   11/09/2020 Surgery   Bilateral mastectomies: Left mastectomy: Benign Right mastectomy: Residual microinvasive cancer status post neoadjuvant therapy, DCIS, 0/12 lymph nodes, ER +1%, PR negative, HER2 equivocal 2+ by [facility]   11/19/2020 - 07/15/2021 Chemotherapy   Patient is on Treatment Plan : BREAST Trastuzumab  + Pertuzumab q21d x 13 cycles     01/11/2021 - 03/09/2021 Radiation Therapy   Site Technique Total Dose (Gy) Dose per Fx (Gy) Completed Fx Beam Energies  Chest Wall, Right: CW_Rt 3D 50.4/50.4 1.8 28/28 10X  Chest Wall, Right: CW_Rt_SCLV 3D 50.4/50.4 1.8 28/28 6X, 10X  Chest Wall, Right: CW_Rt_Bst Electron 10/10 2 5/5 6E     06/08/2021 - 06/24/2021 Anti-estrogen oral therapy   Tamoxifen (discontinued)   03/24/2022 Relapse/Recurrence   Right chest palpable nodule: 1.2 cm mass at 10 o'clock position biopsy revealed grade 2 IDC with high-grade DCIS ER 20% positive weak staining, PR 0%, HER2 3+ positive, Ki-67 50%   04/06/2022 Imaging   CT  chest/abd/pelvis  IMPRESSION: 1. No evidence of metastatic disease in the chest, abdomen or pelvis. 2. Similar probable postradiation change in the peripheral anterior right upper lobe, anterior right middle lobe, lateral right lower lobe and right lung apex. 3. Surgical changes of  subtotal colectomy with short segment wall thickening of the neo terminal ileum and short segments of small bowel throughout the abdomen pelvis, with interposed gas and fluid-filled distended loops of small bowel intermixed between the areas of inflammation. Findings are compatible active inflammatory Crohn's enteritis. 4. Small volume right lower quadrant and pelvic free fluid, likely reactive.     Electronically Signed   By: Maudry Mayhew M.D.   On: 04/06/2022 12:53     05/02/2022 Surgery   Right lumpectomy: Recurrent grade 3 IDC, posterior margin focally involved by cancer.   05/17/2022 -  Chemotherapy   Patient is on Treatment Plan : BREAST ADO-Trastuzumab Emtansine (Kadcyla) q21d       CURRENT THERAPY:  INTERVAL HISTORY: [name] 47 y.o. female returns for f/u.  She has had a recent prolonged hospitalization secondary to her Crohn's flare.     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   Septic shock (HCC) 12/14/2022   Cardiac arrest (HCC) 12/13/2022   Chronic disease anemia 11/19/2022   Protein-calorie malnutrition, severe (HCC) 11/19/2022   Failure to thrive in adult 11/19/2022   Anorexia 11/19/2022   Nausea 11/19/2022   Leukocytosis 05/27/2021   Crohn's disease of both small and large intestine with intestinal obstruction (HCC)    Breast cancer (HCC) 11/09/2020   Port-A-Cath in place 07/15/2020   Hypokalemia 06/30/2020   Genetic testing 06/25/2020   Family history of breast cancer 06/11/2020   Malignant neoplasm of upper-outer quadrant of right breast in female, estrogen receptor  positive (HCC) 06/04/2020   Abnormal finding present on diagnostic imaging of uterus 05/07/2019   Generalized abdominal pain    Vitamin D deficiency 09/24/2013   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  MEDICAL HISTORY: Past Medical History:  Diagnosis Date   Breast cancer (HCC)    right   Colon stricture (HCC) 05/07/2019   Colonic stricture (HCC) 11/19/2022   Crohn's colitis, with intestinal obstruction (HCC) 09/17/2013   Dx 2006 - right and left colon involved 2011 - pan-colitis 09/17/2013 left colitis 60-20 cm March 2021 subtotal colectomy ileosigmoid anastomosis for colonic strictures-Dr. 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 10/17/2010   Rectal bleeding    Uterine fibroid    Vitamin D deficiency 09/24/2013    SURGICAL HISTORY: Past Surgical History:  Procedure Laterality Date   AXILLARY LYMPH NODE DISSECTION Right 11/09/2020   Procedure: RIGHT AXILLARY LYMPH NODE DISSECTION;  Surgeon: Emelia Loron, MD;  Location: [facility];  Service: General;  Laterality: Right;  BIOPSY  07/01/2020   Procedure: BIOPSY;  Surgeon: Benancio Deeds, MD;  Location: [facility];  Service: Gastroenterology;;   BIOPSY  01/27/2021   Procedure: BIOPSY;  Surgeon: Benancio Deeds, MD;  Location: [facility];  Service: Gastroenterology;;   BIOPSY  09/07/2021   Procedure: BIOPSY;  Surgeon: Lynann Bologna, MD;  Location: [facility];  Service: Gastroenterology;;   BIOPSY  11/21/2022   Procedure: BIOPSY;  Surgeon: Lemar Lofty., MD;  Location: [facility];  Service: Gastroenterology;;    BREAST BIOPSY Right 03/24/2022   [place] RT BREAST BX W LOC DEV 1ST LESION IMG BX SPEC US GUIDE 03/24/2022 [facility]   BREAST BIOPSY  04/28/2022   [place] RT RADIOACTIVE SEED LOC 04/28/2022 [facility]   BREAST IMPLANT REMOVAL Right 08/07/2022   Procedure: REMOVAL  OF RIGHT BREAST IMPLANTS;  Surgeon: Allena Napoleon, MD;  Location: [facility];  Service: Plastics;  Laterality: Right;   BREAST RECONSTRUCTION WITH PLACEMENT OF TISSUE EXPANDER AND FLEX HD (ACELLULAR HYDRATED DERMIS) Bilateral 11/09/2020   Procedure: BILATERAL BREAST RECONSTRUCTION WITH PLACEMENT OF TISSUE EXPANDER AND FLEX HD (ACELLULAR HYDRATED DERMIS);  Surgeon: Allena Napoleon, MD;  Location: [facility];  Service: Plastics;  Laterality: Bilateral;   CAPSULOTOMY Bilateral 07/25/2021   Procedure: CAPSULOTOMY;  Surgeon: Allena Napoleon, MD;  Location: [facility];  Service: Plastics;  Laterality: Bilateral;   COLECTOMY  06/27/2019   COLONOSCOPY  2015   ESOPHAGOGASTRODUODENOSCOPY (EGD) WITH PROPOFOL N/A 11/21/2022   Procedure: ESOPHAGOGASTRODUODENOSCOPY (EGD) WITH PROPOFOL;  Surgeon: Lemar Lofty., MD;  Location: [facility];  Service: Gastroenterology;  Laterality: N/A;   FLEXIBLE SIGMOIDOSCOPY N/A 07/01/2020   Procedure: FLEXIBLE SIGMOIDOSCOPY;  Surgeon: Benancio Deeds, MD;  Location: [facility];  Service: Gastroenterology;  Laterality: N/A;   FLEXIBLE SIGMOIDOSCOPY N/A 01/27/2021   Procedure: FLEXIBLE SIGMOIDOSCOPY;  Surgeon: Benancio Deeds, MD;  Location: [facility];  Service: Gastroenterology;  Laterality: N/A;   FLEXIBLE SIGMOIDOSCOPY N/A 09/07/2021   Procedure: FLEXIBLE SIGMOIDOSCOPY;  Surgeon: Lynann Bologna, MD;  Location: [facility];  Service: Gastroenterology;  Laterality: N/A;   FLEXIBLE SIGMOIDOSCOPY N/A 11/21/2022   Procedure: FLEXIBLE SIGMOIDOSCOPY;  Surgeon: Meridee Score Netty Starring., MD;  Location: [facility];  Service: Gastroenterology;  Laterality:  N/A;   IR FLUORO GUIDE CV LINE RIGHT  01/09/2023   IR US GUIDE VASC ACCESS RIGHT  01/09/2023   LAPAROTOMY N/A 12/14/2022   Procedure: EXPLORATORY LAPAROTOMY WITH SMALL BOWEL RESECTION;  Surgeon: Moise Boring, MD;  Location: [facility];  Service: General;  Laterality: N/A;   LAPAROTOMY N/A 12/16/2022   Procedure: REOPENING LAPAROTOMY, ILEOSTOMY;  ABDOMINAL CLOSURE;  Surgeon: Moise Boring, MD;  Location: [facility];  Service: General;  Laterality: N/A;   NIPPLE SPARING MASTECTOMY Bilateral 11/09/2020   Procedure: BILATERAL NIPPLE SPARING MASTECTOMY;  Surgeon: Emelia Loron, MD;  Location: [facility];  Service: General;  Laterality: Bilateral;   PORT-A-CATH REMOVAL N/A 07/25/2021   Procedure: REMOVAL PORT-A-CATH;  Surgeon: Allena Napoleon, MD;  Location: [facility];  Service: Plastics;  Laterality: N/A;   PORT-A-CATH REMOVAL Left 01/05/2023   Procedure: REMOVAL PORT-A-CATH;  Surgeon: Abigail Miyamoto, MD;  Location: [facility];  Service: General;  Laterality: Left;   PORTACATH PLACEMENT N/A 06/23/2020   Procedure: INSERTION PORT-A-CATH;  Surgeon: Emelia Loron, MD;  Location: [facility];  Service: General;  Laterality: N/A;  START TIME OF 11:00 AM FOR 60 MINUTES [place] [facility]   PORTACATH PLACEMENT Left 05/02/2022  Procedure: INSERTION PORT-A-CATH;  Surgeon: Emelia Loron, MD;  Location: [facility];  Service: General;  Laterality: Left;   RADIOACTIVE SEED GUIDED EXCISIONAL BREAST BIOPSY Right 05/02/2022   Procedure: RADIOACTIVE SEED GUIDED RIGHT BREAST MASS EXCISION;  Surgeon: Emelia Loron, MD;  Location: [facility];  Service: General;  Laterality: Right;   REMOVAL OF BILATERAL TISSUE EXPANDERS WITH PLACEMENT OF BILATERAL BREAST IMPLANTS Bilateral 07/25/2021   Procedure: REMOVAL OF BILATERAL TISSUE EXPANDERS WITH PLACEMENT OF BILATERAL BREAST IMPLANTS;  Surgeon: Allena Napoleon, MD;  Location: [facility];  Service: Plastics;  Laterality: Bilateral;  1.5    SOCIAL HISTORY: Social History   Socioeconomic History   Marital status: Single    Spouse name: Not on file   Number of children: 1   Years of education: 13   Highest education level: Not on file  Occupational History   Occupation: Medical Coder   Tobacco Use   Smoking status: Former    Types: Cigarettes   Smokeless tobacco: Never  Vaping Use   Vaping status: Never Used  Substance and Sexual Activity   Alcohol use: Yes    Alcohol/week: 1.0 - 2.0 standard drink of alcohol    Types: 1 - 2 Glasses of wine per week   Drug use: No   Sexual activity: Yes    Partners: Male    Comment: Intermittent protection not consistent  Other Topics Concern   Not on file  Social History Narrative   Single, 1 daughter born 2001 approximately   Medical coder for [employer]   She is a former smoker, occasional alcohol no drug use   Social Determinants of Corporate investment banker Strain: Not on file  Food Insecurity: No Food Insecurity (11/21/2022)   Hunger Vital Sign    Worried About Running Out of Food in the Last Year: Never true    Ran Out of Food in the Last Year: Never true  Transportation Needs: No Transportation Needs (11/21/2022)   PRAPARE - Administrator, Civil Service (Medical): No    Lack of Transportation (Non-Medical): No  Physical Activity: Not on file  Stress: Not on file  Social Connections: Not on file  Intimate Partner Violence: Not At Risk (11/21/2022)   Humiliation, Afraid, Rape, and Kick questionnaire    Fear of Current or Ex-Partner: No    Emotionally Abused: No    Physically Abused: No    Sexually Abused: No    FAMILY HISTORY: Family History  Problem Relation Age of Onset   Diabetes Father    Breast cancer Maternal Aunt        dx unknown age   Diabetes Paternal Aunt    Breast cancer Cousin        maternal cousin; dx unknown age   Esophageal cancer Neg Hx    Rectal cancer Neg Hx     Stomach cancer Neg Hx    Colon polyps Neg Hx    Colon cancer Neg Hx     Review of Systems  Constitutional:  Negative for appetite change, chills, fatigue, fever and unexpected weight change.  HENT:   Negative for hearing loss, lump/mass and trouble swallowing.   Eyes:  Negative for eye problems and icterus.  Respiratory:  Negative for chest tightness, cough and shortness of breath.   Cardiovascular:  Negative for chest pain, leg swelling and palpitations.  Gastrointestinal:  Negative for abdominal distention, abdominal pain, constipation, diarrhea, nausea and vomiting.  Endocrine: Negative  for hot flashes.  Genitourinary:  Negative for difficulty urinating.   Musculoskeletal:  Negative for arthralgias.  Skin:  Negative for itching and rash.  Neurological:  Negative for dizziness, extremity weakness, headaches and numbness.  Hematological:  Negative for adenopathy. Does not bruise/bleed easily.  Psychiatric/Behavioral:  Negative for depression. The patient is not nervous/anxious.       PHYSICAL EXAMINATION Patient sounds well.  She is in no apparent distress.  Mood and behavior are normal.       ASSESSMENT and THERAPY PLAN:   No problem-specific Assessment & Plan notes found for this encounter.  Follow up instructions:    -Return to cancer center ***  -Mammogram due in *** -Follow up with surgery ***   The patient was provided an opportunity to ask questions and all were answered. The patient agreed with the plan and demonstrated an understanding of the instructions.   The patient was advised to call back or seek an in-person evaluation if the symptoms worsen or if the condition fails to improve as anticipated.   I provided *** minutes of {Blank single:19197::"face-to-face video visit time","non face-to-face telephone visit time"} during this encounter, and > 50% was spent counseling as documented under my assessment & plan.  Lillard Anes, NP 01/22/23 11:28 AM Medical  Oncology and Hematology [facility] [address] [place], [place] [address] Tel. [phone]    Fax. [phone]

## 2023-01-23 ENCOUNTER — Encounter: Payer: Self-pay | Admitting: Adult Health

## 2023-01-23 ENCOUNTER — Inpatient Hospital Stay: Payer: Managed Care, Other (non HMO) | Attending: Hematology and Oncology | Admitting: Adult Health

## 2023-01-23 DIAGNOSIS — Z170 Estrogen receptor positive status [ER+]: Secondary | ICD-10-CM

## 2023-01-23 DIAGNOSIS — C50411 Malignant neoplasm of upper-outer quadrant of right female breast: Secondary | ICD-10-CM | POA: Diagnosis not present

## 2023-01-23 NOTE — Assessment & Plan Note (Signed)
[name] is a 47 year old woman with recurrent HER2 positive breast cancer status postlumpectomy here today for follow-up and evaluation prior to receiving her Kadcyla.  Stage IIIb ER positive HER2 positive breast cancer: She has no signs of breast cancer recurrence.  We will see her back in a few months as she recovers from her recent hospitalization.

## 2023-01-24 ENCOUNTER — Telehealth: Payer: Self-pay | Admitting: Adult Health

## 2023-01-24 ENCOUNTER — Inpatient Hospital Stay: Payer: Self-pay | Admitting: Family

## 2023-01-24 NOTE — Telephone Encounter (Signed)
Spoke with patient confirming upcoming appointment  

## 2023-01-26 ENCOUNTER — Other Ambulatory Visit: Payer: Self-pay

## 2023-02-02 ENCOUNTER — Telehealth: Payer: Self-pay | Admitting: Internal Medicine

## 2023-02-02 NOTE — Telephone Encounter (Signed)
Spoke to Dr. Hardin Negus, surgeon for [facility].  The patient is improving and is preparing for discharge  In the near future.  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  She had been hospitalized earlier this year and had an internal hernia and bowel infarction and required resection.  Other issues include metastatic breast cancer not currently on treatment.   Dr. Logan Bores was asking about follow-up and help with her short gut and TPN and consideration for [facility] therapy.  I requested that he seek help at a tertiary care center with more expertise in those matters given my extremely limited experience with those problems.  I can see her back with respect to her Crohn's disease when the time is appropriate.

## 2023-02-02 NOTE — Telephone Encounter (Signed)
Good morning Dr. Leone Payor,  Dr. Logan Bores called requesting to speak with you regarding a plan for patients care. He is requesting a call whenever you get the chance.  [phone]

## 2023-02-08 NOTE — Telephone Encounter (Signed)
Candred from [facility] is calling to find out if Dr. Leone Payor has someone in particular that he can refer patient to for short bowel syndrome. Please advise [phone]

## 2023-02-08 NOTE — Telephone Encounter (Signed)
[facility] - Small intestine transplant and intestinal rehabilitation program  [phone]  Patient is not a candidate for a transplant due to history of cancer but it says they also care for short bowel patients without transplant

## 2023-02-08 NOTE — Telephone Encounter (Signed)
Candace (Case Manager) with [facility] called back & stated patient would be on TPN, and would need lab work followed, and would like to know if Dr. Leone Payor would be able to do this.

## 2023-02-08 NOTE — Telephone Encounter (Addendum)
Whomever is managing her TPN will need to follow her labs.  So I will not do that as I will not manage her TPN  I had previously spoken to Dr. Logan Bores about this as outlined in earlier in the notes thread here.

## 2023-02-08 NOTE — Telephone Encounter (Signed)
Left message for [name] with [facility] to call back.

## 2023-02-08 NOTE — Telephone Encounter (Signed)
Spoke with Candace & discussed MD recommendations. No further questions.

## 2023-02-09 NOTE — Telephone Encounter (Signed)
Candace with [facility] has been made aware of MD recommendations.

## 2023-02-11 ENCOUNTER — Emergency Department (HOSPITAL_COMMUNITY): Payer: Managed Care, Other (non HMO)

## 2023-02-11 ENCOUNTER — Observation Stay (HOSPITAL_COMMUNITY)
Admission: EM | Admit: 2023-02-11 | Discharge: 2023-02-12 | Disposition: A | Discharge status: Home / Self Care | Payer: Managed Care, Other (non HMO) | Attending: Internal Medicine | Admitting: Internal Medicine

## 2023-02-11 ENCOUNTER — Other Ambulatory Visit: Payer: Self-pay

## 2023-02-11 ENCOUNTER — Encounter (HOSPITAL_COMMUNITY): Payer: Self-pay

## 2023-02-11 DIAGNOSIS — I10 Essential (primary) hypertension: Secondary | ICD-10-CM | POA: Diagnosis not present

## 2023-02-11 DIAGNOSIS — E119 Type 2 diabetes mellitus without complications: Secondary | ICD-10-CM | POA: Diagnosis not present

## 2023-02-11 DIAGNOSIS — R112 Nausea with vomiting, unspecified: Secondary | ICD-10-CM | POA: Diagnosis present

## 2023-02-11 DIAGNOSIS — Z79899 Other long term (current) drug therapy: Secondary | ICD-10-CM | POA: Insufficient documentation

## 2023-02-11 DIAGNOSIS — Z7901 Long term (current) use of anticoagulants: Secondary | ICD-10-CM | POA: Diagnosis not present

## 2023-02-11 DIAGNOSIS — R109 Unspecified abdominal pain: Principal | ICD-10-CM | POA: Diagnosis present

## 2023-02-11 DIAGNOSIS — Z794 Long term (current) use of insulin: Secondary | ICD-10-CM | POA: Diagnosis not present

## 2023-02-11 DIAGNOSIS — I2699 Other pulmonary embolism without acute cor pulmonale: Secondary | ICD-10-CM | POA: Diagnosis not present

## 2023-02-11 DIAGNOSIS — Z8719 Personal history of other diseases of the digestive system: Secondary | ICD-10-CM

## 2023-02-11 DIAGNOSIS — Z87891 Personal history of nicotine dependence: Secondary | ICD-10-CM | POA: Insufficient documentation

## 2023-02-11 DIAGNOSIS — Z9049 Acquired absence of other specified parts of digestive tract: Secondary | ICD-10-CM | POA: Diagnosis not present

## 2023-02-11 DIAGNOSIS — D649 Anemia, unspecified: Secondary | ICD-10-CM | POA: Diagnosis not present

## 2023-02-11 DIAGNOSIS — K625 Hemorrhage of anus and rectum: Secondary | ICD-10-CM | POA: Insufficient documentation

## 2023-02-11 DIAGNOSIS — Z853 Personal history of malignant neoplasm of breast: Secondary | ICD-10-CM | POA: Insufficient documentation

## 2023-02-11 LAB — CBC WITH DIFFERENTIAL/PLATELET
Abs Immature Granulocytes: 0.04 10*3/uL (ref 0.00–0.07)
Basophils Absolute: 0 10*3/uL (ref 0.0–0.1)
Basophils Relative: 0 %
Eosinophils Absolute: 0.1 10*3/uL (ref 0.0–0.5)
Eosinophils Relative: 1 %
HCT: 32.2 % — ABNORMAL LOW (ref 36.0–46.0)
Hemoglobin: 10.8 g/dL — ABNORMAL LOW (ref 12.0–15.0)
Immature Granulocytes: 0 %
Lymphocytes Relative: 17 %
Lymphs Abs: 1.8 10*3/uL (ref 0.7–4.0)
MCH: 29.2 pg (ref 26.0–34.0)
MCHC: 33.5 g/dL (ref 30.0–36.0)
MCV: 87 fL (ref 80.0–100.0)
Monocytes Absolute: 0.7 10*3/uL (ref 0.1–1.0)
Monocytes Relative: 7 %
Neutro Abs: 7.9 10*3/uL — ABNORMAL HIGH (ref 1.7–7.7)
Neutrophils Relative %: 75 %
Platelets: 361 10*3/uL (ref 150–400)
RBC: 3.7 MIL/uL — ABNORMAL LOW (ref 3.87–5.11)
RDW: 15.9 % — ABNORMAL HIGH (ref 11.5–15.5)
WBC: 10.6 10*3/uL — ABNORMAL HIGH (ref 4.0–10.5)
nRBC: 0 % (ref 0.0–0.2)

## 2023-02-11 LAB — COMPREHENSIVE METABOLIC PANEL
ALT: 19 U/L (ref 0–44)
AST: 28 U/L (ref 15–41)
Albumin: 3.5 g/dL (ref 3.5–5.0)
Alkaline Phosphatase: 107 U/L (ref 38–126)
Anion gap: 11 (ref 5–15)
BUN: 25 mg/dL — ABNORMAL HIGH (ref 6–20)
CO2: 23 mmol/L (ref 22–32)
Calcium: 9.5 mg/dL (ref 8.9–10.3)
Chloride: 102 mmol/L (ref 98–111)
Creatinine, Ser: 0.72 mg/dL (ref 0.44–1.00)
GFR, Estimated: >60 mL/min (ref >60)
Glucose, Bld: 96 mg/dL (ref 70–99)
Potassium: 3.7 mmol/L (ref 3.5–5.1)
Sodium: 136 mmol/L (ref 135–145)
Total Bilirubin: 0.4 mg/dL (ref 0.3–1.2)
Total Protein: 8.3 g/dL — ABNORMAL HIGH (ref 6.5–8.1)

## 2023-02-11 LAB — HCG, SERUM, QUALITATIVE: Preg, Serum: NEGATIVE

## 2023-02-11 LAB — PROTIME-INR
INR: 1.1 (ref 0.8–1.2)
Prothrombin Time: 14.2 s (ref 11.4–15.2)

## 2023-02-11 LAB — LIPASE, BLOOD: Lipase: 35 U/L (ref 11–51)

## 2023-02-11 LAB — POC OCCULT BLOOD, ED
Fecal Occult Bld: NEGATIVE
Fecal Occult Bld: POSITIVE — AB

## 2023-02-11 LAB — HEMOGLOBIN AND HEMATOCRIT, BLOOD
HCT: 29.9 % — ABNORMAL LOW (ref 36.0–46.0)
Hemoglobin: 9.5 g/dL — ABNORMAL LOW (ref 12.0–15.0)

## 2023-02-11 LAB — LACTIC ACID, PLASMA: Lactic Acid, Venous: 0.9 mmol/L (ref 0.5–1.9)

## 2023-02-11 MED ORDER — IOHEXOL 350 MG/ML SOLN
100.0000 mL | Freq: Once | INTRAVENOUS | Status: AC | PRN
  Administered 2023-02-11: 100 mL via INTRAVENOUS

## 2023-02-11 MED ORDER — MORPHINE SULFATE (PF) 2 MG/ML IV SOLN
1.0000 mg | Freq: Once | INTRAVENOUS | Status: AC
  Administered 2023-02-11: 1 mg via INTRAVENOUS
  Filled 2023-02-11: qty 1

## 2023-02-11 MED ORDER — LACTATED RINGERS IV BOLUS
1000.0000 mL | Freq: Once | INTRAVENOUS | Status: AC
  Administered 2023-02-11: 1000 mL via INTRAVENOUS

## 2023-02-11 MED ORDER — METOPROLOL TARTRATE 25 MG PO TABS
25.0000 mg | ORAL_TABLET | Freq: Once | ORAL | Status: AC
  Administered 2023-02-11: 25 mg via ORAL
  Filled 2023-02-11: qty 1

## 2023-02-11 NOTE — ED Provider Triage Note (Signed)
Emergency Medicine Provider Triage Evaluation Note  [name] , a 47 y.o. female  was evaluated in triage.  Pt complains of blood in stool form rectum. Hx Ostomy bag. Long recent admission, dc from [facility] Friday  Review of Systems  Positive: Blood form rectum Negative:   Physical Exam  BP (!) 110/90 (BP Location: Left Arm)   Pulse 92   Temp 97.9 F (36.6 C)   Resp 18   Ht 5\' 4"  (1.626 m)   Wt 49.9 kg   LMP 04/24/2021   SpO2 99%   BMI 18.88 kg/m  Gen:   Awake, no distress   Resp:  Normal effort  MSK:   Moves extremities without difficulty  ABD:  ostomy Other:    Medical Decision Making  Medically screening exam initiated at 5:15 PM.  Appropriate orders placed.  KEONDRA HAYDU was informed that the remainder of the evaluation will be completed by another provider, this initial triage assessment does not replace that evaluation, and the importance of remaining in the ED until their evaluation is complete.  Blood from rectum   Jonte Shiller A, PA-C 02/11/23 1716

## 2023-02-11 NOTE — ED Provider Notes (Signed)
[facility] AT [facility] Provider Note   CSN: [id number] Arrival date & time: 02/11/23  1609     History Chief Complaint  Patient presents with   Abdominal Pain    [name] is a 47 y.o. female.  Patient recently got discharged from [facility] on Friday. Patient recently had a long admission during which she had ischemic colitis requiring exlap, she required ACLS and required intensivist care. Hospital course also included PE, abdominal fluid collection requiring drain placement.   Patient reports that since she was discharged from [facility] she has not been able to take any of her medications due to the pharmacy saying that they still needed to be verified by a physician. Thus, patient has not taken any of her medications including her immodium for ostomy output, TPN, lovenox for PE, or others.   Today patient had intermittent abdominal pain. She had black tarry stool output from her rectum today as well. She had not had any output from her rectum since her surgery. She has had large watery ostomy output as well.   Denies light headedness, palpitations, or chest pain. Denies fever. Had drain placed on 10/8 and has been draining light green mucous like material. Was supposed to get a repeat CT 1-2 weeks after to have drain removed, but never had repeat CT.   She had one episode of vomiting today and has been nauseous.   The history is provided by the patient, a relative and medical records.  Abdominal Pain Associated symptoms: nausea, shortness of breath and vomiting   Associated symptoms: no chest pain and no fever        Home Medications Prior to Admission medications   Medication Sig Start Date End Date Taking? Authorizing Provider  acetaminophen (TYLENOL) 500 MG tablet Take 500-1,000 mg by mouth every 6 (six) hours as needed for mild pain.   Yes [provider]  ALPRAZolam (XANAX) 0.25 MG tablet Take 1 tablet (0.25 mg total)  by mouth 2 (two) times daily. 01/12/23  Yes Kathlen Mody, MD  artificial tears (LACRILUBE) OINT ophthalmic ointment Place into both eyes every 4 (four) hours as needed for dry eyes. 01/12/23  Yes Kathlen Mody, MD  ascorbic acid (VITAMIN C) 500 MG tablet Take 1 tablet (500 mg total) by mouth 2 (two) times daily. 01/12/23  Yes Kathlen Mody, MD  cholecalciferol (VITAMIN D3) 25 MCG (1000 UNIT) tablet Take 1,000 Units by mouth daily.   Yes [provider]  diphenoxylate-atropine (LOMOTIL) 2.5-0.025 MG tablet Take 2 tablets by mouth 4 (four) times daily. 01/12/23  Yes Kathlen Mody, MD  famotidine (PEPCID) 40 MG/5ML suspension Take 2.5 mLs (20 mg total) by mouth daily. 01/13/23  Yes Kathlen Mody, MD  ferrous sulfate 300 (60 Fe) MG/5ML syrup Take 5 mLs (300 mg total) by mouth 2 (two) times daily. 01/16/23  Yes Kathlen Mody, MD  fiber (NUTRISOURCE FIBER) PACK packet Take 1 packet by mouth 3 (three) times daily. 01/12/23  Yes Kathlen Mody, MD  food thickener (SIMPLYTHICK, NECTAR/LEVEL 2/MILDLY THICK,) GEL Take 1 packet by mouth as needed. 01/12/23  Yes Kathlen Mody, MD  gabapentin (NEURONTIN) 250 MG/5ML solution Take 4 mLs (200 mg total) by mouth every 8 (eight) hours. 01/12/23  Yes Kathlen Mody, MD  loperamide HCl (IMODIUM) 1 MG/7.5ML suspension Take 30 mLs (4 mg total) by mouth 4 (four) times daily. 01/12/23  Yes Kathlen Mody, MD  metoprolol tartrate (LOPRESSOR) 50 MG tablet Take 0.5 tablets (25 mg total)  by mouth 2 (two) times daily. 01/16/23  Yes Kathlen Mody, MD  Multiple Vitamin (MULTIVITAMIN) LIQD Place 15 mLs into feeding tube daily. 01/17/23  Yes Kathlen Mody, MD  Opium 10 MG/ML (1%) TINC Place 1 mL (10 mg total) into feeding tube 4 (four) times daily. 01/16/23  Yes Kathlen Mody, MD  oxyCODONE (OXY IR/ROXICODONE) 5 MG immediate release tablet Take 1-2 tablets (5-10 mg total) by mouth every 4 (four) hours as needed for breakthrough pain. 01/12/23  Yes Kathlen Mody, MD  Vitamin D,  Ergocalciferol, (DRISDOL) 1.25 MG (50000 UNIT) CAPS capsule Take 1 capsule (50,000 Units total) by mouth every 7 (seven) days. 01/18/23  Yes Kathlen Mody, MD  albuterol (PROVENTIL) (2.5 MG/3ML) 0.083% nebulizer solution Take 3 mLs (2.5 mg total) by nebulization every 4 (four) hours as needed for wheezing or shortness of breath. 01/12/23   Kathlen Mody, MD  ciprofloxacin (CIPRO) 400 MG/200ML SOLN Inject 200 mLs (400 mg total) into the vein 2 (two) times daily for 26 days. Patient not taking: Reported on 02/11/2023 01/16/23 02/11/23  Kathlen Mody, MD  enoxaparin (LOVENOX) 60 MG/0.6ML injection Inject 0.5 mLs (50 mg total) into the skin every 12 (twelve) hours. Patient not taking: Reported on 02/11/2023 01/16/23   Kathlen Mody, MD  insulin aspart (NOVOLOG) 100 UNIT/ML injection Inject 0-9 Units into the skin 3 (three) times daily with meals. Patient not taking: Reported on 02/11/2023 01/12/23   Kathlen Mody, MD  lip balm (CARMEX) ointment Apply topically as needed. Patient not taking: Reported on 02/11/2023 01/12/23   Kathlen Mody, MD  metroNIDAZOLE (FLAGYL) 500 MG/100ML Inject 100 mLs (500 mg total) into the vein 2 (two) times daily for 26 days. Patient not taking: Reported on 02/11/2023 01/16/23 02/11/23  Kathlen Mody, MD  Nutritional Supplements (FEEDING SUPPLEMENT, KATE FARMS STANDARD 1.4,) LIQD liquid Take 325 mLs by mouth 2 (two) times daily between meals. Patient not taking: Reported on 02/11/2023 01/12/23   Kathlen Mody, MD  Nutritional Supplements (FEEDING SUPPLEMENT, KATE FARMS STANDARD 1.4,) LIQD liquid Take 360 mLs by mouth daily. Patient not taking: Reported on 02/11/2023 01/12/23   Kathlen Mody, MD  Nutritional Supplements (FEEDING SUPPLEMENT, KATE FARMS STANDARD 1.4,) LIQD liquid Take 325 mLs by mouth 2 (two) times daily between meals. Patient not taking: Reported on 02/11/2023 01/16/23   Kathlen Mody, MD  nystatin (MYCOSTATIN) 100000 UNIT/ML suspension Take 5 mLs (500,000 Units total) by mouth  4 (four) times daily. Patient not taking: Reported on 02/11/2023 01/16/23   Kathlen Mody, MD      Allergies    Nsaids, Chlorhexidine gluconate, Hydromorphone, and Lactose intolerance (gi)    Review of Systems   Review of Systems  Constitutional:  Positive for appetite change. Negative for fever.  Respiratory:  Positive for chest tightness and shortness of breath.   Cardiovascular:  Negative for chest pain and palpitations.  Gastrointestinal:  Positive for abdominal pain, blood in stool, nausea and vomiting.    Physical Exam Updated Vital Signs BP 107/78   Pulse (!) 109   Temp 98.4 F (36.9 C)   Resp (!) 23   Ht 5\' 4"  (1.626 m)   Wt 49.9 kg   LMP 04/24/2021   SpO2 100%   BMI 18.88 kg/m  Physical Exam Constitutional:      General: She is not in acute distress. HENT:     Mouth/Throat:     Mouth: Mucous membranes are moist.  Cardiovascular:     Rate and Rhythm: Regular rhythm. Tachycardia present.  Heart sounds: Normal heart sounds.  Pulmonary:     Effort: Pulmonary effort is normal.     Breath sounds: Normal breath sounds.  Abdominal:     General: Abdomen is flat. Bowel sounds are normal.     Palpations: Abdomen is soft.     Tenderness: There is abdominal tenderness in the right lower quadrant and epigastric area. There is guarding. There is no rebound.     Hernia: No hernia is present.     Comments: Midline lower abdominal wound dry and without drainage, protected from ostomy.  Ostomy with watery green output   Genitourinary:    Rectum: Guaiac result positive.     Comments: Rectum without any visible external tears or hemorrhoids , internally no hemorrhoids palpated, mucous and small amount of bright red material on glove  Skin:    General: Skin is warm and dry.     Capillary Refill: Capillary refill takes less than 2 seconds.  Neurological:     Mental Status: She is alert.     ED Results / Procedures / Treatments   Labs (all labs ordered are listed, but  only abnormal results are displayed) Labs Reviewed  CBC WITH DIFFERENTIAL/PLATELET - Abnormal; Notable for the following components:      Result Value   WBC 10.6 (*)    RBC 3.70 (*)    Hemoglobin 10.8 (*)    HCT 32.2 (*)    RDW 15.9 (*)    Neutro Abs 7.9 (*)    All other components within normal limits  COMPREHENSIVE METABOLIC PANEL - Abnormal; Notable for the following components:   BUN 25 (*)    Total Protein 8.3 (*)    All other components within normal limits  HEMOGLOBIN AND HEMATOCRIT, BLOOD - Abnormal; Notable for the following components:   Hemoglobin 9.5 (*)    HCT 29.9 (*)    All other components within normal limits  POC OCCULT BLOOD, ED - Abnormal; Notable for the following components:   Fecal Occult Bld POSITIVE (*)    All other components within normal limits  LIPASE, BLOOD  HCG, SERUM, QUALITATIVE  PROTIME-INR  LACTIC ACID, PLASMA  LACTIC ACID, PLASMA  POC OCCULT BLOOD, ED  POC OCCULT BLOOD, ED    EKG None  Radiology CT ANGIO GI BLEED  Result Date: 02/11/2023 CLINICAL DATA:  Lower GI bleed EXAM: CTA ABDOMEN AND PELVIS WITHOUT AND WITH CONTRAST TECHNIQUE: Multidetector CT imaging of the abdomen and pelvis was performed using the standard protocol during bolus administration of intravenous contrast. Multiplanar reconstructed images and MIPs were obtained and reviewed to evaluate the vascular anatomy. RADIATION DOSE REDUCTION: This exam was performed according to the departmental dose-optimization program which includes automated exposure control, adjustment of the mA and/or kV according to patient size and/or use of iterative reconstruction technique. CONTRAST:  OMNIPAQUE IOHEXOL 350 MG/ML SOLN COMPARISON:  12/31/2022 FINDINGS: VASCULAR Aorta: Normal caliber aorta without aneurysm, dissection, vasculitis or significant stenosis. Celiac: Patent without evidence of aneurysm, dissection, vasculitis or significant stenosis. SMA: Patent without evidence of aneurysm,  dissection, vasculitis or significant stenosis. Renals: Both renal arteries are patent without evidence of aneurysm, dissection, vasculitis, fibromuscular dysplasia or significant stenosis. IMA: Patent without evidence of aneurysm, dissection, vasculitis or significant stenosis. Inflow: Patent without evidence of aneurysm, dissection, vasculitis or significant stenosis. Proximal Outflow: Bilateral common femoral and visualized portions of the superficial and profunda femoral arteries are patent without evidence of aneurysm, dissection, vasculitis or significant stenosis. Veins: No obvious venous abnormality  within the limitations of this arterial phase study. Review of the MIP images confirms the above findings. NON-VASCULAR Lower chest: No acute findings Hepatobiliary: No focal hepatic abnormality. Gallbladder unremarkable. Pancreas: No focal abnormality or ductal dilatation. Spleen: No focal abnormality.  Normal size. Adrenals/Urinary Tract: No adrenal abnormality. No focal renal abnormality. No stones or hydronephrosis. Urinary bladder is unremarkable. Stomach/Bowel: Left abdominal ostomy, status post partial colectomy and small bowel resection. Contrast material is seen within the Hartmann's pouch, therefore cannot assess for active GI bleed. No other areas of active contrast extravasation. No bowel obstruction. Lymphatic: No evidence of aneurysm or adenopathy. Reproductive: Enlarged fibroid uterus. Other: Pigtail drainage catheter is in the midline of the pelvis with large residual fluid collection measuring 12 x 3 cm, decreased significantly in size. This previously measured 20 x 10 cm. Musculoskeletal: No acute bony abnormality. IMPRESSION: VASCULAR No visible active GI bleed. Contrast material noted within the Hartmann's pouch. Therefore cannot assess for active contrast extravasation. NON-VASCULAR Postoperative changes from partial colectomy with left lower quadrant ostomy. No bowel obstruction. Large fluid  collection centrally in the pelvis with pigtail drainage catheter in place. Fluid collection has significantly decreased in size since prior study. Electronically Signed   By: Charlett Nose M.D.   On: 02/11/2023 23:07   DG Chest Portable 1 View  Result Date: 02/11/2023 CLINICAL DATA:  PICC line placement EXAM: PORTABLE CHEST 1 VIEW COMPARISON:  01/03/2023 FINDINGS: Right internal jugular central venous catheter tip noted at the superior cavoatrial junction. Previously identified right subclavian central venous catheter and left internal jugular chest port have been removed. Opacity overlying the left lung base relates to breast prosthesis. Lungs are clear. No pneumothorax or pleural effusion. Cardiac size within normal limits. Pulmonary vascularity is normal. No acute bone abnormality. Surgical clips are seen within the right axilla. IMPRESSION: 1. Right internal jugular central venous catheter tip at the superior cavoatrial junction. Electronically Signed   By: Helyn Numbers M.D.   On: 02/11/2023 20:19    Procedures Procedures    Medications Ordered in ED Medications  morphine (PF) 2 MG/ML injection 1 mg (1 mg Intravenous Given 02/11/23 2044)  lactated ringers bolus 1,000 mL (1,000 mLs Intravenous New Bag/Given 02/11/23 2156)  metoprolol tartrate (LOPRESSOR) tablet 25 mg (25 mg Oral Given 02/11/23 2157)  iohexol (OMNIPAQUE) 350 MG/ML injection 100 mL (100 mLs Intravenous Contrast Given 02/11/23 2239)    ED Course/ Medical Decision Making/ A&P Clinical Course as of 02/11/23 2358  Sun Feb 11, 2023  2317 CTA without evidence of active GI bleed and with improvement of fluid collection in pelvis; however, there was collection of  [AB]    Clinical Course User Index [AB] Lockie Mola, MD                                 Medical Decision Making This patient presents to the ED with chief complaint(s) of abdominal pain and rectal bleeding with pertinent past medical history of Crohn's disease with  ischemic colitis status post colectomy and ex lap, PE on anticoagulation, abdominal fluid collection with drain, which further complicates the presenting complaint. The complaint involves an extensive differential diagnosis and also carries with it a high risk of complications and morbidity.    The differential diagnosis includes surgical anastomosis leak, rectal ischemia, ischemia of small bowel, increased ostomy output causing abdominal pain secondary to lack of medications  Additional history obtained: Additional history obtained from  family Records reviewed previous admission documents, Care Everywhere/External Records, and Primary Care Documents  ED Course and Reassessment:   Independent labs interpretation:  The following labs were independently interpreted: WBC 10.6 leukocytosis improved from previous, hemoglobin of 10.9 improved from prior admission however then decreased to 9.5.  Lactic acid unremarkable.  Hemoccult from ostomy negative, Hemoccult from rectum positive.  Independent visualization of imaging: - I independently visualized the following imaging with scope of interpretation limited to determining acute life threatening conditions related to emergency care: CT angio, which revealed no obvious extravasation of contrast. Radiology interpretation of no acute GI bleed, contrast in the [facility] pouch, improvement of abdominal/pelvic fluid collection with pigtail drain in place.  Consultation: - Consulted or discussed management/test interpretation w/ external professional: General Surgery Romie Levee) consulted regarding rectal bleeding and abdominal pain along with drop in hemoglobin and persistent tachycardia.  Dr. Maisie Fus noted that this is normal status post colectomy and blood from rectum as expected.  Patient cannot have significant bleeding from the site causing hemodynamic instability.  From surgery standpoint no further observation or workup was recommended. -Service was  consulted for evaluation of admission for observation as patient continued to have abdominal pain and some pleuritic chest pain along with drop in hemoglobin and persistent tachycardia.  Given significant history and significant morbidity would want observation of tachycardia as well as follow-up of hemoglobin before discharging patient.  Consideration for admission or further workup: As above Social Determinants of health: Disability, lack of medications  The patient appears reasonably stabilized for admission considering the current resources, flow, and capabilities available in the ED at this time, and I doubt any other [facility] requiring further screening and/or treatment in the ED prior to admission.   Amount and/or Complexity of Data Reviewed Independent Historian: caregiver External Data Reviewed: labs, radiology and notes. Labs: ordered. Decision-making details documented in ED Course. Radiology: ordered and independent interpretation performed. Decision-making details documented in ED Course. ECG/medicine tests: ordered and independent interpretation performed. Decision-making details documented in ED Course.  Risk Prescription drug management. Decision regarding hospitalization.  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

## 2023-02-11 NOTE — ED Triage Notes (Signed)
Pt complains of blood in stool form rectum. Recently admitted for a long time, d/c to long term facility. Hx Ostomy bag. Got home on Friday [facility]

## 2023-02-12 DIAGNOSIS — R1084 Generalized abdominal pain: Secondary | ICD-10-CM | POA: Diagnosis not present

## 2023-02-12 LAB — CBC WITH DIFFERENTIAL/PLATELET
Abs Immature Granulocytes: 0.02 10*3/uL (ref 0.00–0.07)
Basophils Absolute: 0 10*3/uL (ref 0.0–0.1)
Basophils Relative: 0 %
Eosinophils Absolute: 0.1 10*3/uL (ref 0.0–0.5)
Eosinophils Relative: 2 %
HCT: 27.7 % — ABNORMAL LOW (ref 36.0–46.0)
Hemoglobin: 9.2 g/dL — ABNORMAL LOW (ref 12.0–15.0)
Immature Granulocytes: 0 %
Lymphocytes Relative: 24 %
Lymphs Abs: 1.7 10*3/uL (ref 0.7–4.0)
MCH: 28.8 pg (ref 26.0–34.0)
MCHC: 33.2 g/dL (ref 30.0–36.0)
MCV: 86.6 fL (ref 80.0–100.0)
Monocytes Absolute: 0.6 10*3/uL (ref 0.1–1.0)
Monocytes Relative: 9 %
Neutro Abs: 4.7 10*3/uL (ref 1.7–7.7)
Neutrophils Relative %: 65 %
Platelets: 285 10*3/uL (ref 150–400)
RBC: 3.2 MIL/uL — ABNORMAL LOW (ref 3.87–5.11)
RDW: 15.8 % — ABNORMAL HIGH (ref 11.5–15.5)
WBC: 7.3 10*3/uL (ref 4.0–10.5)
nRBC: 0 % (ref 0.0–0.2)

## 2023-02-12 LAB — COMPREHENSIVE METABOLIC PANEL
ALT: 17 U/L (ref 0–44)
AST: 27 U/L (ref 15–41)
Albumin: 2.7 g/dL — ABNORMAL LOW (ref 3.5–5.0)
Alkaline Phosphatase: 89 U/L (ref 38–126)
Anion gap: 8 (ref 5–15)
BUN: 17 mg/dL (ref 6–20)
CO2: 26 mmol/L (ref 22–32)
Calcium: 8.7 mg/dL — ABNORMAL LOW (ref 8.9–10.3)
Chloride: 100 mmol/L (ref 98–111)
Creatinine, Ser: 0.63 mg/dL (ref 0.44–1.00)
GFR, Estimated: >60 mL/min (ref >60)
Glucose, Bld: 91 mg/dL (ref 70–99)
Potassium: 3.4 mmol/L — ABNORMAL LOW (ref 3.5–5.1)
Sodium: 134 mmol/L — ABNORMAL LOW (ref 135–145)
Total Bilirubin: 1 mg/dL (ref <1.2)
Total Protein: 6.8 g/dL (ref 6.5–8.1)

## 2023-02-12 LAB — CBC
HCT: 27.1 % — ABNORMAL LOW (ref 36.0–46.0)
Hemoglobin: 8.9 g/dL — ABNORMAL LOW (ref 12.0–15.0)
MCH: 28.6 pg (ref 26.0–34.0)
MCHC: 32.8 g/dL (ref 30.0–36.0)
MCV: 87.1 fL (ref 80.0–100.0)
Platelets: 298 10*3/uL (ref 150–400)
RBC: 3.11 MIL/uL — ABNORMAL LOW (ref 3.87–5.11)
RDW: 15.9 % — ABNORMAL HIGH (ref 11.5–15.5)
WBC: 7.4 10*3/uL (ref 4.0–10.5)
nRBC: 0 % (ref 0.0–0.2)

## 2023-02-12 LAB — PHOSPHORUS: Phosphorus: 4.6 mg/dL (ref 2.5–4.6)

## 2023-02-12 LAB — MAGNESIUM: Magnesium: 1.4 mg/dL — ABNORMAL LOW (ref 1.7–2.4)

## 2023-02-12 MED ORDER — SODIUM CHLORIDE 0.9 % IV SOLN: INTRAVENOUS | Status: DC

## 2023-02-12 MED ORDER — FAMOTIDINE 40 MG/5ML PO SUSR
20.0000 mg | Freq: Every day | ORAL | Status: DC
  Filled 2023-02-12: qty 2.5

## 2023-02-12 MED ORDER — LOPERAMIDE HCL 2 MG PO TABS: 2.0000 mg | ORAL_TABLET | Freq: Four times a day (QID) | ORAL | 0 refills | Status: DC | PRN

## 2023-02-12 MED ORDER — ACETAMINOPHEN 325 MG PO TABS: 650.0000 mg | ORAL_TABLET | Freq: Four times a day (QID) | ORAL | Status: DC | PRN

## 2023-02-12 MED ORDER — POLYETHYLENE GLYCOL 3350 17 G PO PACK: 17.0000 g | PACK | Freq: Every day | ORAL | Status: DC | PRN

## 2023-02-12 MED ORDER — ENOXAPARIN SODIUM 60 MG/0.6ML IJ SOSY
1.0000 mg/kg | PREFILLED_SYRINGE | Freq: Two times a day (BID) | INTRAMUSCULAR | Status: DC
  Administered 2023-02-12: 50 mg via SUBCUTANEOUS
  Filled 2023-02-12 (×2): qty 0.6

## 2023-02-12 MED ORDER — METOPROLOL TARTRATE 25 MG PO TABS: 25.0000 mg | ORAL_TABLET | Freq: Two times a day (BID) | ORAL | 0 refills | Status: DC

## 2023-02-12 MED ORDER — FERROUS SULFATE 300 (60 FE) MG/5ML PO SOLN
300.0000 mg | Freq: Two times a day (BID) | ORAL | Status: DC
  Administered 2023-02-12: 300 mg via ORAL
  Filled 2023-02-12 (×2): qty 5

## 2023-02-12 MED ORDER — ARTIFICIAL TEARS OPHTHALMIC OINT: TOPICAL_OINTMENT | OPHTHALMIC | Status: DC | PRN

## 2023-02-12 MED ORDER — OXYCODONE HCL 5 MG PO TABS: 5.0000 mg | ORAL_TABLET | Freq: Four times a day (QID) | ORAL | Status: DC | PRN

## 2023-02-12 MED ORDER — MAGNESIUM SULFATE 2 GM/50ML IV SOLN
2.0000 g | Freq: Once | INTRAVENOUS | Status: AC
  Administered 2023-02-12: 2 g via INTRAVENOUS
  Filled 2023-02-12: qty 50

## 2023-02-12 NOTE — Progress Notes (Signed)
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  Assessment:  Patient on chronic TPN outpatient for massive bowel resection. Patient was admitted 11/3 PM with CC of diffuse abdominal pain, N/V. Pharmacy was consulted for TPN management 11/4 with the understanding that the patient may discharge 11/4 afternoon. Per discussion with Dr. Allena Katz, patient is discharging today and inpatient TPN is not needed. Pharmacy will sign off.    Thank you,  Cherylin Mylar, PharmD Clinical Pharmacist  11/4/202411:15 AM

## 2023-02-12 NOTE — H&P (Addendum)
History and Physical  GAE BIHL QVZ:[id number] DOB: 11-Mar-1976 DOA: 02/11/2023  Referring physician: Dr. Marsh Dolly, EDP PCP: Ollen Bowl, MD  Outpatient Specialists: Oncology, GI Patient coming from: Home  Chief Complaint: Diffuse abdominal pain, nausea and vomiting.  HPI: [name] is a 47 y.o. female with medical history significant for GERD, prediabetes, diabetic polyneuropathy, iron-deficiency anemia, hypertension, Crohn's disease with ischemic bowel status post colectomy (1 month ago), prior cardiac arrest, history of pulmonary embolism on full dose Lovenox twice daily, recently discharged from [facility] on Friday, 3 days ago, who presents to the ED with complaints of diffuse abdominal pain.  Associated with nausea and vomiting x 1 today.  Additionally, she missed doses of her Lovenox since her discharge from [facility] as she could not get it filled.  Denies any cardiothoracic symptoms.  Endorses black tarry stool from her rectum today.  She presented to the ED for further evaluation.  In the ED, tachycardic and tachypneic.  CT angio GI bleed negative for any active bleeding.  Drop in hemoglobin 9.5 from 10.8 with positive FOBT.  EDP discussed the case with general surgery on-call.  No plan for surgical intervention.  EDP requested admission.  Admitted by [facility], hospitalist service.  ED Course: Temperature 98.4.  BP 93/59, pulse 98, respiratory 17, saturation 97% on room air.  Review of Systems: Review of systems as noted in the HPI. All other systems reviewed and are negative.   Past Medical History:  Diagnosis Date   Breast cancer (HCC)    right   Colon stricture (HCC) 05/07/2019   Colonic stricture (HCC) 11/19/2022   Crohn's colitis, with intestinal obstruction (HCC) 09/17/2013   Dx 2006 - right and left colon involved 2011 - pan-colitis 09/17/2013 left colitis 60-20 cm March 2021 subtotal colectomy ileosigmoid anastomosis for colonic strictures-Dr. Maisie Fus     Crohn's  disease [facility])    Exacerbation of Crohn's disease of large intestine (HCC) 12/13/2022   Family history of breast cancer 06/11/2020   History of abdominal subtotal colectomy 11/19/2022   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 10/17/2010   Rectal bleeding    Uterine fibroid    Vitamin D deficiency 09/24/2013   Past Surgical History:  Procedure Laterality Date   AXILLARY LYMPH NODE DISSECTION Right 11/09/2020   Procedure: RIGHT AXILLARY LYMPH NODE DISSECTION;  Surgeon: Emelia Loron, MD;  Location: [facility];  Service: General;  Laterality: Right;   BIOPSY  07/01/2020   Procedure: BIOPSY;  Surgeon: Benancio Deeds, MD;  [facility];  Service: Gastroenterology;;   BIOPSY  01/27/2021   Procedure: BIOPSY;  Surgeon: Benancio Deeds, MD;  [facility];  Service: Gastroenterology;;   BIOPSY  09/07/2021   Procedure: BIOPSY;  Surgeon: Lynann Bologna, MD;  [facility];  Service: Gastroenterology;;   BIOPSY  11/21/2022   Procedure: BIOPSY;  Surgeon: Lemar Lofty., MD;  [facility];  Service: Gastroenterology;;   BREAST BIOPSY Right 03/24/2022   [place] RT BREAST BX W LOC DEV 1ST LESION IMG BX SPEC US GUIDE 03/24/2022 [facility]   BREAST BIOPSY  04/28/2022   [place] RT RADIOACTIVE SEED LOC 04/28/2022 [facility]   BREAST IMPLANT REMOVAL Right 08/07/2022   Procedure: REMOVAL  OF RIGHT BREAST IMPLANTS;  Surgeon: Allena Napoleon, MD;  Location: [facility];  Service: Plastics;  Laterality: Right;   BREAST RECONSTRUCTION WITH PLACEMENT OF TISSUE EXPANDER AND FLEX HD (ACELLULAR HYDRATED DERMIS) Bilateral  11/09/2020   Procedure: BILATERAL BREAST RECONSTRUCTION WITH PLACEMENT OF TISSUE EXPANDER AND FLEX HD (ACELLULAR HYDRATED DERMIS);  Surgeon: Allena Napoleon, MD;  Location: [facility];  Service: Plastics;  Laterality: Bilateral;   CAPSULOTOMY Bilateral 07/25/2021    Procedure: CAPSULOTOMY;  Surgeon: Allena Napoleon, MD;  Location: [facility];  Service: Plastics;  Laterality: Bilateral;   COLECTOMY  06/27/2019   COLONOSCOPY  2015   ESOPHAGOGASTRODUODENOSCOPY (EGD) WITH PROPOFOL N/A 11/21/2022   Procedure: ESOPHAGOGASTRODUODENOSCOPY (EGD) WITH PROPOFOL;  Surgeon: Lemar Lofty., MD;  [facility];  Service: Gastroenterology;  Laterality: N/A;   FLEXIBLE SIGMOIDOSCOPY N/A 07/01/2020   Procedure: FLEXIBLE SIGMOIDOSCOPY;  Surgeon: Benancio Deeds, MD;  [facility];  Service: Gastroenterology;  Laterality: N/A;   FLEXIBLE SIGMOIDOSCOPY N/A 01/27/2021   Procedure: FLEXIBLE SIGMOIDOSCOPY;  Surgeon: Benancio Deeds, MD;  [facility];  Service: Gastroenterology;  Laterality: N/A;   FLEXIBLE SIGMOIDOSCOPY N/A 09/07/2021   Procedure: FLEXIBLE SIGMOIDOSCOPY;  Surgeon: Lynann Bologna, MD;  [facility];  Service: Gastroenterology;  Laterality: N/A;   FLEXIBLE SIGMOIDOSCOPY N/A 11/21/2022   Procedure: FLEXIBLE SIGMOIDOSCOPY;  Surgeon: Meridee Score Netty Starring., MD;  Location: [facility];  Service: Gastroenterology;  Laterality: N/A;   IR FLUORO GUIDE CV LINE RIGHT  01/09/2023   IR US GUIDE VASC ACCESS RIGHT  01/09/2023   LAPAROTOMY N/A 12/14/2022   Procedure: EXPLORATORY LAPAROTOMY WITH SMALL BOWEL RESECTION;  Surgeon: Moise Boring, MD;  [facility];  Service: General;  Laterality: N/A;   LAPAROTOMY N/A 12/16/2022   Procedure: REOPENING LAPAROTOMY, ILEOSTOMY;  ABDOMINAL CLOSURE;  Surgeon: Moise Boring, MD;  [facility];  Service: General;  Laterality: N/A;   NIPPLE SPARING MASTECTOMY Bilateral 11/09/2020   Procedure: BILATERAL NIPPLE SPARING MASTECTOMY;  Surgeon: Emelia Loron, MD;  Location: [facility];  Service: General;  Laterality: Bilateral;   PORT-A-CATH REMOVAL N/A 07/25/2021   Procedure: REMOVAL PORT-A-CATH;  Surgeon: Allena Napoleon, MD;  Location: [facility];  Service: Plastics;  Laterality: N/A;   PORT-A-CATH REMOVAL Left 01/05/2023   Procedure: REMOVAL PORT-A-CATH;  Surgeon: Abigail Miyamoto, MD;  [facility];  Service: General;  Laterality: Left;   PORTACATH PLACEMENT N/A 06/23/2020   Procedure: INSERTION PORT-A-CATH;  Surgeon: Emelia Loron, MD;  Location: [facility];  Service: General;  Laterality: N/A;  START TIME OF 11:00 AM FOR 60 MINUTES [place] [facility]   PORTACATH PLACEMENT Left 05/02/2022   Procedure: INSERTION PORT-A-CATH;  Surgeon: Emelia Loron, MD;  Location: [facility];  Service: General;  Laterality: Left;   RADIOACTIVE SEED GUIDED EXCISIONAL BREAST BIOPSY Right 05/02/2022   Procedure: RADIOACTIVE SEED GUIDED RIGHT BREAST MASS EXCISION;  Surgeon: Emelia Loron, MD;  Location: [facility];  Service: General;  Laterality: Right;   REMOVAL OF BILATERAL TISSUE EXPANDERS WITH PLACEMENT OF BILATERAL BREAST IMPLANTS Bilateral 07/25/2021   Procedure: REMOVAL OF BILATERAL TISSUE EXPANDERS WITH PLACEMENT OF BILATERAL BREAST IMPLANTS;  Surgeon: Allena Napoleon, MD;  Location: [facility];  Service: Plastics;  Laterality: Bilateral;  1.5    Social History:  reports that she has quit smoking. Her smoking use included cigarettes. She has never used smokeless tobacco. She reports that she does not currently use alcohol after a past usage of about 1.0 - 2.0 standard drink of alcohol per week. She reports that she does not use drugs.   Allergies  Allergen Reactions   Nsaids Other (See Comments)  Crohns disease/ IBD   Chlorhexidine Gluconate Itching    Able to use CHG swabs for ports, reaction with direct skin contact   Hydromorphone Other (See Comments)    Confusion/sedation   Lactose Intolerance (Gi) Other (See Comments)    Upset stomach/diarrhea    Family History  Problem Relation Age of Onset   Diabetes Father    Breast cancer Maternal Aunt         dx unknown age   Diabetes Paternal Aunt    Breast cancer Cousin        maternal cousin; dx unknown age   Esophageal cancer Neg Hx    Rectal cancer Neg Hx    Stomach cancer Neg Hx    Colon polyps Neg Hx    Colon cancer Neg Hx       Prior to Admission medications   Medication Sig Start Date End Date Taking? Authorizing Provider  acetaminophen (TYLENOL) 500 MG tablet Take 500-1,000 mg by mouth every 6 (six) hours as needed for mild pain.   Yes [provider]  ALPRAZolam (XANAX) 0.25 MG tablet Take 1 tablet (0.25 mg total) by mouth 2 (two) times daily. 01/12/23  Yes Kathlen Mody, MD  artificial tears (LACRILUBE) OINT ophthalmic ointment Place into both eyes every 4 (four) hours as needed for dry eyes. 01/12/23  Yes Kathlen Mody, MD  ascorbic acid (VITAMIN C) 500 MG tablet Take 1 tablet (500 mg total) by mouth 2 (two) times daily. 01/12/23  Yes Kathlen Mody, MD  cholecalciferol (VITAMIN D3) 25 MCG (1000 UNIT) tablet Take 1,000 Units by mouth daily.   Yes [provider]  diphenoxylate-atropine (LOMOTIL) 2.5-0.025 MG tablet Take 2 tablets by mouth 4 (four) times daily. 01/12/23  Yes Kathlen Mody, MD  famotidine (PEPCID) 40 MG/5ML suspension Take 2.5 mLs (20 mg total) by mouth daily. 01/13/23  Yes Kathlen Mody, MD  ferrous sulfate 300 (60 Fe) MG/5ML syrup Take 5 mLs (300 mg total) by mouth 2 (two) times daily. 01/16/23  Yes Kathlen Mody, MD  fiber (NUTRISOURCE FIBER) PACK packet Take 1 packet by mouth 3 (three) times daily. 01/12/23  Yes Kathlen Mody, MD  food thickener (SIMPLYTHICK, NECTAR/LEVEL 2/MILDLY THICK,) GEL Take 1 packet by mouth as needed. 01/12/23  Yes Kathlen Mody, MD  gabapentin (NEURONTIN) 250 MG/5ML solution Take 4 mLs (200 mg total) by mouth every 8 (eight) hours. 01/12/23  Yes Kathlen Mody, MD  loperamide HCl (IMODIUM) 1 MG/7.5ML suspension Take 30 mLs (4 mg total) by mouth 4 (four) times daily. 01/12/23  Yes Kathlen Mody, MD  metoprolol tartrate (LOPRESSOR) 50  MG tablet Take 0.5 tablets (25 mg total) by mouth 2 (two) times daily. 01/16/23  Yes Kathlen Mody, MD  Multiple Vitamin (MULTIVITAMIN) LIQD Place 15 mLs into feeding tube daily. 01/17/23  Yes Kathlen Mody, MD  Opium 10 MG/ML (1%) TINC Place 1 mL (10 mg total) into feeding tube 4 (four) times daily. 01/16/23  Yes Kathlen Mody, MD  oxyCODONE (OXY IR/ROXICODONE) 5 MG immediate release tablet Take 1-2 tablets (5-10 mg total) by mouth every 4 (four) hours as needed for breakthrough pain. 01/12/23  Yes Kathlen Mody, MD  Vitamin D, Ergocalciferol, (DRISDOL) 1.25 MG (50000 UNIT) CAPS capsule Take 1 capsule (50,000 Units total) by mouth every 7 (seven) days. 01/18/23  Yes Kathlen Mody, MD  albuterol (PROVENTIL) (2.5 MG/3ML) 0.083% nebulizer solution Take 3 mLs (2.5 mg total) by nebulization every 4 (four) hours as needed for wheezing or shortness of breath. 01/12/23  Kathlen Mody, MD  enoxaparin (LOVENOX) 60 MG/0.6ML injection Inject 0.5 mLs (50 mg total) into the skin every 12 (twelve) hours. Patient not taking: Reported on 02/11/2023 01/16/23   Kathlen Mody, MD  insulin aspart (NOVOLOG) 100 UNIT/ML injection Inject 0-9 Units into the skin 3 (three) times daily with meals. Patient not taking: Reported on 02/11/2023 01/12/23   Kathlen Mody, MD  lip balm (CARMEX) ointment Apply topically as needed. Patient not taking: Reported on 02/11/2023 01/12/23   Kathlen Mody, MD  Nutritional Supplements (FEEDING SUPPLEMENT, KATE FARMS STANDARD 1.4,) LIQD liquid Take 325 mLs by mouth 2 (two) times daily between meals. Patient not taking: Reported on 02/11/2023 01/12/23   Kathlen Mody, MD  Nutritional Supplements (FEEDING SUPPLEMENT, KATE FARMS STANDARD 1.4,) LIQD liquid Take 360 mLs by mouth daily. Patient not taking: Reported on 02/11/2023 01/12/23   Kathlen Mody, MD  Nutritional Supplements (FEEDING SUPPLEMENT, KATE FARMS STANDARD 1.4,) LIQD liquid Take 325 mLs by mouth 2 (two) times daily between meals. Patient not  taking: Reported on 02/11/2023 01/16/23   Kathlen Mody, MD  nystatin (MYCOSTATIN) 100000 UNIT/ML suspension Take 5 mLs (500,000 Units total) by mouth 4 (four) times daily. Patient not taking: Reported on 02/11/2023 01/16/23   Kathlen Mody, MD    Physical Exam: BP 107/88   Pulse 100   Temp 98.4 F (36.9 C)   Resp 20   Ht 5\' 4"  (1.626 m)   Wt 49.9 kg   LMP 04/24/2021   SpO2 97%   BMI 18.88 kg/m   General: 47 y.o. year-old female frail-appearing in no acute distress.  Alert and oriented x3. Cardiovascular: Regular rate and rhythm with no rubs or gallops.  No thyromegaly or JVD noted.  No lower extremity edema. 2/4 pulses in all 4 extremities. Respiratory: Clear to auscultation with no wheezes or rales. Good inspiratory effort. Abdomen: Soft nontender nondistended with normal bowel sounds x4 quadrants.  Left lower quadrant colostomy bag, no overt bleeding noted. Muskuloskeletal: No cyanosis, clubbing or edema noted bilaterally Neuro: CN II-XII intact, strength, sensation, reflexes Skin: No ulcerative lesions noted or rashes Psychiatry: Judgement and insight appear normal. Mood is appropriate for condition and setting          Labs on Admission:  Basic Metabolic Panel: Recent Labs  Lab 02/11/23 1733  NA 136  K 3.7  CL 102  CO2 23  GLUCOSE 96  BUN 25*  CREATININE 0.72  CALCIUM 9.5   Liver Function Tests: Recent Labs  Lab 02/11/23 1733  AST 28  ALT 19  ALKPHOS 107  BILITOT 0.4  PROT 8.3*  ALBUMIN 3.5   Recent Labs  Lab 02/11/23 1733  LIPASE 35   No results for input(s): "AMMONIA" in the last 168 hours. CBC: Recent Labs  Lab 02/11/23 1733 02/11/23 2047  WBC 10.6*  --   NEUTROABS 7.9*  --   HGB 10.8* 9.5*  HCT 32.2* 29.9*  MCV 87.0  --   PLT 361  --    Cardiac Enzymes: No results for input(s): "CKTOTAL", "CKMB", "CKMBINDEX", "TROPONINI" in the last 168 hours.  BNP (last 3 results) No results for input(s): "BNP" in the last 8760 hours.  ProBNP (last 3  results) No results for input(s): "PROBNP" in the last 8760 hours.  CBG: No results for input(s): "GLUCAP" in the last 168 hours.  Radiological Exams on Admission: CT ANGIO GI BLEED  Result Date: 02/11/2023 CLINICAL DATA:  Lower GI bleed EXAM: CTA ABDOMEN AND PELVIS WITHOUT AND WITH CONTRAST TECHNIQUE:  Multidetector CT imaging of the abdomen and pelvis was performed using the standard protocol during bolus administration of intravenous contrast. Multiplanar reconstructed images and MIPs were obtained and reviewed to evaluate the vascular anatomy. RADIATION DOSE REDUCTION: This exam was performed according to the departmental dose-optimization program which includes automated exposure control, adjustment of the mA and/or kV according to patient size and/or use of iterative reconstruction technique. CONTRAST:  OMNIPAQUE IOHEXOL 350 MG/ML SOLN COMPARISON:  12/31/2022 FINDINGS: VASCULAR Aorta: Normal caliber aorta without aneurysm, dissection, vasculitis or significant stenosis. Celiac: Patent without evidence of aneurysm, dissection, vasculitis or significant stenosis. SMA: Patent without evidence of aneurysm, dissection, vasculitis or significant stenosis. Renals: Both renal arteries are patent without evidence of aneurysm, dissection, vasculitis, fibromuscular dysplasia or significant stenosis. IMA: Patent without evidence of aneurysm, dissection, vasculitis or significant stenosis. Inflow: Patent without evidence of aneurysm, dissection, vasculitis or significant stenosis. Proximal Outflow: Bilateral common femoral and visualized portions of the superficial and profunda femoral arteries are patent without evidence of aneurysm, dissection, vasculitis or significant stenosis. Veins: No obvious venous abnormality within the limitations of this arterial phase study. Review of the MIP images confirms the above findings. NON-VASCULAR Lower chest: No acute findings Hepatobiliary: No focal hepatic  abnormality. Gallbladder unremarkable. Pancreas: No focal abnormality or ductal dilatation. Spleen: No focal abnormality.  Normal size. Adrenals/Urinary Tract: No adrenal abnormality. No focal renal abnormality. No stones or hydronephrosis. Urinary bladder is unremarkable. Stomach/Bowel: Left abdominal ostomy, status post partial colectomy and small bowel resection. Contrast material is seen within the Hartmann's pouch, therefore cannot assess for active GI bleed. No other areas of active contrast extravasation. No bowel obstruction. Lymphatic: No evidence of aneurysm or adenopathy. Reproductive: Enlarged fibroid uterus. Other: Pigtail drainage catheter is in the midline of the pelvis with large residual fluid collection measuring 12 x 3 cm, decreased significantly in size. This previously measured 20 x 10 cm. Musculoskeletal: No acute bony abnormality. IMPRESSION: VASCULAR No visible active GI bleed. Contrast material noted within the Hartmann's pouch. Therefore cannot assess for active contrast extravasation. NON-VASCULAR Postoperative changes from partial colectomy with left lower quadrant ostomy. No bowel obstruction. Large fluid collection centrally in the pelvis with pigtail drainage catheter in place. Fluid collection has significantly decreased in size since prior study. Electronically Signed   By: Charlett Nose M.D.   On: 02/11/2023 23:07   DG Chest Portable 1 View  Result Date: 02/11/2023 CLINICAL DATA:  PICC line placement EXAM: PORTABLE CHEST 1 VIEW COMPARISON:  01/03/2023 FINDINGS: Right internal jugular central venous catheter tip noted at the superior cavoatrial junction. Previously identified right subclavian central venous catheter and left internal jugular chest port have been removed. Opacity overlying the left lung base relates to breast prosthesis. Lungs are clear. No pneumothorax or pleural effusion. Cardiac size within normal limits. Pulmonary vascularity is normal. No acute bone  abnormality. Surgical clips are seen within the right axilla. IMPRESSION: 1. Right internal jugular central venous catheter tip at the superior cavoatrial junction. Electronically Signed   By: Helyn Numbers M.D.   On: 02/11/2023 20:19    EKG: I independently viewed the EKG done and my findings are as followed: None available at the time of this visit.  Assessment/Plan Present on Admission:  Abdominal pain  Principal Problem:   Abdominal pain  Abdominal pain, improving Nausea and vomiting, improving Unclear etiology Continue supportive care As needed analgesics and as needed antiemetics Gentle IV fluid hydration NS at 50 cc/h x 1 day.  History of pulmonary  embolism Resume home full dose subcu Lovenox twice daily As needed analgesics.  Hypertension BPs are currently soft Hold off home oral antihypertensives Closely monitor vital signs  GERD Resume home PPI  Prediabetes Last hemoglobin A1c 5.7 on 12/13/2022 Hold off insulin   Time: 75 minutes.   DVT prophylaxis: Full dose subcu Lovenox twice daily  Code Status: Full code.  Family Communication: None at bedside.  Disposition Plan: Admitted to telemetry care unit.  Consults called: None.  Admission status: Observation status.   Status is: Observation    Darlin Drop MD [facility] Pager [phone]  If 7PM-7AM, please contact night-coverage [email or web address] Password [email or web address]  02/12/2023, 1:18 AM

## 2023-02-12 NOTE — ED Notes (Signed)
Pt discharged home. Discharge information discussed. No s/s of distress observed during discharge. 

## 2023-02-12 NOTE — Discharge Instructions (Signed)
Follow up with PCP

## 2023-02-12 NOTE — Care Management (Addendum)
Transition of Care [facility]) - Emergency Department Mini Assessment   Patient Details  Name: [name] MRN: [id number] Date of Birth: 22-Mar-1976  Transition of [facility] CM/SW Contact:    Lavenia Atlas, RN Phone Number: 02/12/2023, 11:55 AM   Clinical Narrative: Received secure chat no TOC consult. Patient being discharge from [facility] ED today. This RNCM spoke with patient at bedside who reports she was recently discharged from [facility] and noticed bloody, tarry looking stool. Patient reports she has called [facility] and spoke with CM who is working to have [facility]'s MD to follow up on medications. Patient reports this is her first time filling Eliquis. This RNCM provided patient with a discount card for free 30 day supply and encouraged patient to use if Eliquis is too expensive with her insurance. This RNCM also encouraged patient to sign up with Eliquis website for assistance affording medications. Patient verbalized understanding.   This RNCM left voicemail message for CM with [facility] to follow up on medication [facility] needs to verify.   - 12:10pm This RNCM spoke with [facility] [phone], who reports they do not have a prescription for Eliquis and MD with [facility] will needto follow up on the prior auth for ocycodone 10. [facility] reports the they are working on metoprolol and the remained of the medications are for today from [facility]. Patient is already follow up with [facility] re: there medications. This RNCM spoke with the patient to advise Eliquis was not on medication list for [facility]. Patient reports it maybe another blood thinner, the caseworker at [facility] told her it was Eliquis. This RNCM advised patient to follow up with [facility] on what medications she was prescribed. This RNCM has not received a call back from [facility].   - 1:27pm This RNCM spoke with CM Marcell Anger with [facility] who reports all medications have been called in and they have spoke with pharmacy and patient is all set  with her medications.   Transporation at discharge: patient will call an Benedetto Goad  No additional TOC needs   ED Mini Assessment: What brought you to the Emergency Department? : Patient reports tarry fluid in stool  Barriers to Discharge: Continued Medical Work up  [employer] interventions: assist with medication review  Means of departure: Other (enter comment) Benedetto Goad)  Interventions which prevented an admission or readmission: Medication Review    Patient Contact and Communications Key Contact 1: Patient   Spoke with: [name] Date: 02/12/23,   Contact time: 1154   Call outcome: Discussed medication  Patient states their goals for this hospitalization and ongoing recovery are:: to feel better CMS Medicare.gov Compare Post Acute Care list provided to:: Patient Choice offered to / list presented to : Patient  Admission diagnosis:  Abdominal pain [R10.9] 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   Septic shock (HCC) 12/14/2022   Cardiac arrest (HCC) 12/13/2022   Chronic disease anemia 11/19/2022   Protein-calorie malnutrition, severe (HCC) 11/19/2022   Failure to thrive in adult 11/19/2022   Anorexia 11/19/2022   Nausea 11/19/2022   Leukocytosis 05/27/2021   Crohn's disease of both small and large intestine with intestinal obstruction (HCC)  Breast cancer (HCC) 11/09/2020   Port-A-Cath in place 07/15/2020   Hypokalemia 06/30/2020   Genetic testing 06/25/2020   Family history of breast cancer 06/11/2020   Malignant neoplasm of upper-outer quadrant of right breast in female, estrogen receptor  positive (HCC) 06/04/2020   Abnormal finding present on diagnostic imaging of uterus 05/07/2019   Generalized abdominal pain    Vitamin D deficiency 09/24/2013   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 10/17/2010   PCP:  Ollen Bowl, MD Pharmacy:   [facility] [phone] Gin[place] Otto, [place] - [address] AT [facility] [address] [place] [address] Phone: [phone] Fax: [phone]

## 2023-02-14 ENCOUNTER — Other Ambulatory Visit (HOSPITAL_COMMUNITY): Payer: Self-pay | Admitting: Radiology

## 2023-02-14 ENCOUNTER — Encounter: Payer: Self-pay | Admitting: Internal Medicine

## 2023-02-14 ENCOUNTER — Other Ambulatory Visit: Payer: Self-pay | Admitting: General Surgery

## 2023-02-14 DIAGNOSIS — Z0000 Encounter for general adult medical examination without abnormal findings: Secondary | ICD-10-CM

## 2023-02-14 DIAGNOSIS — R188 Other ascites: Secondary | ICD-10-CM

## 2023-02-15 LAB — ACID FAST CULTURE WITH REFLEXED SENSITIVITIES (MYCOBACTERIA): Acid Fast Culture: NEGATIVE

## 2023-02-16 ENCOUNTER — Encounter: Payer: Self-pay | Admitting: *Deleted

## 2023-02-18 ENCOUNTER — Encounter: Payer: Self-pay | Admitting: Hematology and Oncology

## 2023-02-19 ENCOUNTER — Ambulatory Visit (INDEPENDENT_AMBULATORY_CARE_PROVIDER_SITE_OTHER): Payer: Managed Care, Other (non HMO) | Admitting: Internal Medicine

## 2023-02-19 ENCOUNTER — Encounter: Payer: Self-pay | Admitting: Internal Medicine

## 2023-02-19 ENCOUNTER — Other Ambulatory Visit: Payer: Self-pay

## 2023-02-19 VITALS — BP 103/78 | HR 116 | Temp 98.2°F | Ht 64.0 in | Wt 107.0 lb

## 2023-02-19 DIAGNOSIS — B999 Unspecified infectious disease: Secondary | ICD-10-CM

## 2023-02-19 MED ORDER — AMOXICILLIN-POT CLAVULANATE 875-125 MG PO TABS: 1.0000 | ORAL_TABLET | Freq: Two times a day (BID) | ORAL | 3 refills | Status: DC

## 2023-02-19 NOTE — Progress Notes (Signed)
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   Septic shock (HCC) 12/14/2022   Cardiac arrest (HCC) 12/13/2022   Chronic disease anemia 11/19/2022   Protein-calorie malnutrition, severe (HCC) 11/19/2022   Failure to thrive in adult 11/19/2022   Anorexia 11/19/2022   Nausea 11/19/2022   Leukocytosis 05/27/2021   Crohn's disease of both small and large intestine with intestinal obstruction (HCC)    Breast cancer (HCC) 11/09/2020   Port-A-Cath in place 07/15/2020   Hypokalemia 06/30/2020   Genetic testing 06/25/2020   Family history of breast cancer 06/11/2020   Malignant neoplasm of upper-outer quadrant of right breast in female, estrogen receptor positive (HCC) 06/04/2020   Abnormal finding present on diagnostic imaging of uterus 05/07/2019   Generalized abdominal pain    Vitamin D deficiency 09/24/2013   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 female with Crohn's disease presents for hospital follow-up follow-up of fever/leukocytosis secondary to intra-abdominal infection.  Patient was admitted for SBO 9/20 found to have pulse 9/4 admitted to the ICU.  Underwent ex lap on 9/5 for bowel resection due to bowel ischemia.  Ex lap, ileostomy and abdominal closure on 9/7.  She developed shortness of breath and high output ostomy continued fever and leukocytosis.   CT scan 9/16 showed large amount of ascites.  She had been on vancomycin and Zosyn at that point.  ID was engaged.  Added micafungin 9/17 for possible fungemia.  She did continue with tube feeds.  Transition to Cipro, denies on micafungin blood and fungal cultures no growth.  Paracentesis on 9/7 no growth.  PICC line removed on 9/26.  Repeat blood cultures with no growth.  CT on 9/22 showed early developing obstruction.  Repeat present 9/23.  CT repeat showed large central fluid collection measuring 19X 6 cm.  IR engaged aspirated serosanguineous fluid cultures with no growth.  Transition on Cipro metro x 1 month and follow-up blood [place].  Patient did not make appointment presents today for follow-up. Today 02/19/2023: Patient states that she has been off of antibiotics for a week.  She still has a drain in place.  She is teary-eyed as she is not willing to continue Cipro and metronidazole as her antibiotic made her throw up. ROS  Past Medical History:  Diagnosis Date   Breast cancer (HCC)    right   Colon stricture (HCC) 05/07/2019   Colonic stricture (HCC) 11/19/2022   Crohn's colitis, with intestinal obstruction (HCC) 09/17/2013   Dx 2006 - right and left colon involved 2011 - pan-colitis 09/17/2013 left colitis 60-20 cm March 2021 subtotal colectomy ileosigmoid anastomosis for colonic strictures-Dr. Maisie Fus     Crohn's disease [facility])    Exacerbation of Crohn's disease of large intestine (HCC) 12/13/2022   Family history of breast cancer 06/11/2020   History of abdominal subtotal colectomy 11/19/2022   Iron deficiency  anemia secondary to blood loss (chronic) - Crohn's colitis 10/17/2010   Rectal bleeding    Uterine fibroid    Vitamin D deficiency 09/24/2013    Outpatient Medications Prior to Visit  Medication Sig Dispense Refill   acetaminophen (TYLENOL) 500 MG tablet Take 500-1,000 mg by mouth every 6 (six) hours as needed for mild pain.     albuterol (PROVENTIL) (2.5 MG/3ML) 0.083%  nebulizer solution Take 3 mLs (2.5 mg total) by nebulization every 4 (four) hours as needed for wheezing or shortness of breath. 75 mL 12   ALPRAZolam (XANAX) 0.25 MG tablet Take 1 tablet (0.25 mg total) by mouth 2 (two) times daily.     apixaban (ELIQUIS) 5 MG TABS tablet Take 5 mg by mouth 2 (two) times daily.     artificial tears (LACRILUBE) OINT ophthalmic ointment Place into both eyes every 4 (four) hours as needed for dry eyes.     ascorbic acid (VITAMIN C) 500 MG tablet Take 1 tablet (500 mg total) by mouth 2 (two) times daily.     cholecalciferol (VITAMIN D3) 25 MCG (1000 UNIT) tablet Take 1,000 Units by mouth daily.     diphenoxylate-atropine (LOMOTIL) 2.5-0.025 MG tablet Take 2 tablets by mouth 4 (four) times daily.     famotidine (PEPCID) 40 MG/5ML suspension Take 2.5 mLs (20 mg total) by mouth daily.     ferrous sulfate 300 (60 Fe) MG/5ML syrup Take 5 mLs (300 mg total) by mouth 2 (two) times daily.     fiber (NUTRISOURCE FIBER) PACK packet Take 1 packet by mouth 3 (three) times daily.     food thickener (SIMPLYTHICK, NECTAR/LEVEL 2/MILDLY THICK,) GEL Take 1 packet by mouth as needed.     gabapentin (NEURONTIN) 250 MG/5ML solution Take 4 mLs (200 mg total) by mouth every 8 (eight) hours.     insulin aspart (NOVOLOG) 100 UNIT/ML injection Inject 0-9 Units into the skin 3 (three) times daily with meals. (Patient not taking: Reported on 02/11/2023)     lip balm (CARMEX) ointment Apply topically as needed. (Patient not taking: Reported on 02/11/2023)     loperamide (IMODIUM A-D) 2 MG tablet Take 1 tablet (2 mg total) by mouth 4 (four) times daily as needed for diarrhea or loose stools. 30 tablet 0   metoprolol tartrate (LOPRESSOR) 25 MG tablet Take 1 tablet (25 mg total) by mouth 2 (two) times daily. 60 tablet 0   Multiple Vitamin (MULTIVITAMIN) LIQD Place 15 mLs into feeding tube daily.     Nutritional Supplements (FEEDING SUPPLEMENT, KATE FARMS STANDARD 1.4,) LIQD liquid Take 325 mLs by  mouth 2 (two) times daily between meals. (Patient not taking: Reported on 02/11/2023)     Nutritional Supplements (FEEDING SUPPLEMENT, KATE FARMS STANDARD 1.4,) LIQD liquid Take 360 mLs by mouth daily. (Patient not taking: Reported on 02/11/2023)     Nutritional Supplements (FEEDING SUPPLEMENT, KATE FARMS STANDARD 1.4,) LIQD liquid Take 325 mLs by mouth 2 (two) times daily between meals. (Patient not taking: Reported on 02/11/2023)     nystatin (MYCOSTATIN) 100000 UNIT/ML suspension Take 5 mLs (500,000 Units total) by mouth 4 (four) times daily. (Patient not taking: Reported on 02/11/2023) 60 mL 0   Opium 10 MG/ML (1%) TINC Place 1 mL (10 mg total) into feeding tube 4 (four) times daily.     oxyCODONE (OXY IR/ROXICODONE) 5 MG immediate release tablet Take 1-2 tablets (5-10 mg total) by mouth every 4 (four) hours as needed for breakthrough pain.     Vitamin  D, Ergocalciferol, (DRISDOL) 1.25 MG (50000 UNIT) CAPS capsule Take 1 capsule (50,000 Units total) by mouth every 7 (seven) days.     No facility-administered medications prior to visit.     Allergies  Allergen Reactions   Nsaids Other (See Comments)    Crohns disease/ IBD   Chlorhexidine Gluconate Itching    Able to use CHG swabs for ports, reaction with direct skin contact   Hydromorphone Other (See Comments)    Confusion/sedation   Lactose Intolerance (Gi) Other (See Comments)    Upset stomach/diarrhea    Social History   Tobacco Use   Smoking status: Former    Types: Cigarettes   Smokeless tobacco: Never  Vaping Use   Vaping status: Never Used  Substance Use Topics   Alcohol use: Not Currently    Alcohol/week: 1.0 - 2.0 standard drink of alcohol    Types: 1 - 2 Glasses of wine per week   Drug use: No    Family History  Problem Relation Age of Onset   Diabetes Father    Breast cancer Maternal Aunt        dx unknown age   Diabetes Paternal Aunt    Breast cancer Cousin        maternal cousin; dx unknown age   Esophageal  cancer Neg Hx    Rectal cancer Neg Hx    Stomach cancer Neg Hx    Colon polyps Neg Hx    Colon cancer Neg Hx     Objective:  There were no vitals filed for this visit. There is no height or weight on file to calculate BMI.  Physical Exam  Lab Results: Lab Results  Component Value Date   WBC 7.3 02/12/2023   HGB 9.2 (L) 02/12/2023   HCT 27.7 (L) 02/12/2023   MCV 86.6 02/12/2023   PLT 285 02/12/2023    Lab Results  Component Value Date   CREATININE 0.63 02/12/2023   BUN 17 02/12/2023   NA 134 (L) 02/12/2023   K 3.4 (L) 02/12/2023   CL 100 02/12/2023   CO2 26 02/12/2023    Lab Results  Component Value Date   ALT 17 02/12/2023   AST 27 02/12/2023   ALKPHOS 89 02/12/2023   BILITOT 1.0 02/12/2023     Assessment & Plan:   Problem List Items Addressed This Visit   None #Intra-abdominal abscess status post aspiration. - Patient had a complicated hospital stay initially admitted for SBO requiring bowel resection due to bowel ischemia.  She underwent ex lap ileostomy and abdominal closure on 9/7.  Hospital course was complicated by fever/chills she was Vanco and pip-tazo at 1 point and micafungin was added for possible fungemia.  She had multiple CTs with last EOT on 10/4 showing large fluid collection 19 X6) status post aspiration with serosanguineous fluid with cultures no growth.  She was transition to Cipro/cement metronidazole with anticipated course of 1 month from aspiration. CT angio on 11/3 showed a large fluid collection centrally in the pelvis with pigtail drainage catheter in place, fluid collection has significant decrease in size since prior study now measuring 12X 3 cm, previously 20X 10 cm. -Repeat CT showed persistent fluid collection although decreased in size.  Will continue antibiotics for now.  Patient has been off of Cipro metro for about a week as it made her throw up.  Will add Augmentin.  I discussed with patient he had Augmentin does not offer as valva  coverage as Cipro metro.  She  is unwilling to try the prior antibiotic course again. Plan: - Start Augmentin and continue till atleast drain removal and recommend til ID appt but I suspect she may stop abx as before despite of counseling. -We will reach out to IR for drain follow-up appointment. - Follow-up with infectious disease on 12/16   Danelle Earthly, MD [facility]   02/19/23  2:58 PM I have personally spent 42 minutes involved in face-to-face and non-face-to-face activities for this patient on the day of the visit. Professional time spent includes the following activities: Preparing to see the patient (review of tests), Obtaining and/or reviewing separately obtained history (admission/discharge record), Performing a medically appropriate examination and/or evaluation , Ordering medications/tests/procedures, referring and communicating with other health care professionals, Documenting clinical information in the EMR, Independently interpreting results (not separately reported), Communicating results to the patient/family/caregiver, Counseling and educating the patient/family/caregiver and Care coordination (not separately reported).  e

## 2023-02-20 ENCOUNTER — Ambulatory Visit (HOSPITAL_COMMUNITY)
Admission: RE | Admit: 2023-02-20 | Discharge: 2023-02-20 | Disposition: A | Discharge status: Home / Self Care | Payer: Managed Care, Other (non HMO) | Source: Ambulatory Visit | Attending: Internal Medicine | Admitting: Internal Medicine

## 2023-02-20 ENCOUNTER — Telehealth: Payer: Self-pay

## 2023-02-20 ENCOUNTER — Encounter: Payer: Self-pay | Admitting: Hematology and Oncology

## 2023-02-20 DIAGNOSIS — Z432 Encounter for attention to ileostomy: Secondary | ICD-10-CM | POA: Insufficient documentation

## 2023-02-20 DIAGNOSIS — L24B3 Irritant contact dermatitis related to fecal or urinary stoma or fistula: Secondary | ICD-10-CM

## 2023-02-20 LAB — COMPLETE METABOLIC PANEL WITH GFR
AG Ratio: 0.9 (calc) — ABNORMAL LOW (ref 1.0–2.5)
ALT: 30 U/L — ABNORMAL HIGH (ref 6–29)
AST: 44 U/L — ABNORMAL HIGH (ref 10–35)
Albumin: 3.7 g/dL (ref 3.6–5.1)
Alkaline phosphatase (APISO): 180 U/L — ABNORMAL HIGH (ref 31–125)
BUN/Creatinine Ratio: 40 (calc) — ABNORMAL HIGH (ref 6–22)
BUN: 29 mg/dL — ABNORMAL HIGH (ref 7–25)
CO2: 24 mmol/L (ref 20–32)
Calcium: 9.7 mg/dL (ref 8.6–10.2)
Chloride: 99 mmol/L (ref 98–110)
Creat: 0.72 mg/dL (ref 0.50–0.99)
Globulin: 4.1 g/dL — ABNORMAL HIGH (ref 1.9–3.7)
Glucose, Bld: 81 mg/dL (ref 65–99)
Potassium: 4.2 mmol/L (ref 3.5–5.3)
Sodium: 136 mmol/L (ref 135–146)
Total Bilirubin: 0.5 mg/dL (ref 0.2–1.2)
Total Protein: 7.8 g/dL (ref 6.1–8.1)
eGFR: 105 mL/min/{1.73_m2} (ref >=60)

## 2023-02-20 LAB — CBC WITH DIFFERENTIAL/PLATELET
Absolute Lymphocytes: 1319 {cells}/uL (ref 850–3900)
Absolute Monocytes: 653 {cells}/uL (ref 200–950)
Basophils Absolute: 27 {cells}/uL (ref 0–200)
Basophils Relative: 0.4 %
Eosinophils Absolute: 122 {cells}/uL (ref 15–500)
Eosinophils Relative: 1.8 %
HCT: 36.5 % (ref 35.0–45.0)
Hemoglobin: 11.5 g/dL — ABNORMAL LOW (ref 11.7–15.5)
MCH: 27.7 pg (ref 27.0–33.0)
MCHC: 31.5 g/dL — ABNORMAL LOW (ref 32.0–36.0)
MCV: 88 fL (ref 80.0–100.0)
MPV: 10.2 fL (ref 7.5–12.5)
Monocytes Relative: 9.6 %
Neutro Abs: 4678 {cells}/uL (ref 1500–7800)
Neutrophils Relative %: 68.8 %
Platelets: 342 10*3/uL (ref 140–400)
RBC: 4.15 10*6/uL (ref 3.80–5.10)
RDW: 13.4 % (ref 11.0–15.0)
Total Lymphocyte: 19.4 %
WBC: 6.8 10*3/uL (ref 3.8–10.8)

## 2023-02-20 LAB — SEDIMENTATION RATE: Sed Rate: 84 mm/h — ABNORMAL HIGH (ref 0–20)

## 2023-02-20 LAB — C-REACTIVE PROTEIN: CRP: 24.2 mg/L — ABNORMAL HIGH (ref <8.0)

## 2023-02-20 NOTE — Progress Notes (Signed)
[facility]   Reason for visit:  LMQ ileostomy  patient is tearful and feeling overwhelmed today.  Some leaks and feeling like she doesn't know how to care for herself.  HPI:  Colectomy with end ileostomy Past Medical History:  Diagnosis Date   Breast cancer (HCC)    right   Colon stricture (HCC) 05/07/2019   Colonic stricture (HCC) 11/19/2022   Crohn's colitis, with intestinal obstruction (HCC) 09/17/2013   Dx 2006 - right and left colon involved 2011 - pan-colitis 09/17/2013 left colitis 60-20 cm March 2021 subtotal colectomy ileosigmoid anastomosis for colonic strictures-Dr. 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 10/17/2010   Rectal bleeding    Uterine fibroid    Vitamin D deficiency 09/24/2013   Family History  Problem Relation Age of Onset   Diabetes Father    Breast cancer Maternal Aunt        dx unknown age   Diabetes Paternal Aunt    Breast cancer Cousin        maternal cousin; dx unknown age   Esophageal cancer Neg Hx    Rectal cancer Neg Hx    Stomach cancer Neg Hx    Colon polyps Neg Hx    Colon cancer Neg Hx    Allergies  Allergen Reactions   Nsaids Other (See Comments)    Crohns disease/ IBD   Chlorhexidine Gluconate Itching    Able to use CHG swabs for ports, reaction with direct skin contact   Hydromorphone Other (See Comments)    Confusion/sedation   Lactose Intolerance (Gi) Other (See Comments)    Upset stomach/diarrhea   Current Outpatient Medications  Medication Sig Dispense Refill Last Dose   acetaminophen (TYLENOL) 500 MG tablet Take 500-1,000 mg by mouth every 6 (six) hours as needed for mild pain. (Patient not taking: Reported on 02/19/2023)      albuterol (PROVENTIL) (2.5 MG/3ML) 0.083%  nebulizer solution Take 3 mLs (2.5 mg total) by nebulization every 4 (four) hours as needed for wheezing or shortness of breath. (Patient not taking: Reported on 02/19/2023) 75 mL 12    ALPRAZolam (XANAX) 0.25 MG tablet Take 1 tablet (0.25 mg total) by mouth 2 (two) times daily. (Patient not taking: Reported on 02/19/2023)      amoxicillin-clavulanate (AUGMENTIN) 875-125 MG tablet Take 1 tablet by mouth 2 (two) times daily. 60 tablet 3    apixaban (ELIQUIS) 5 MG TABS tablet Take 5 mg by mouth 2 (two) times daily. (Patient not taking: Reported on 02/19/2023)      artificial tears (LACRILUBE) OINT ophthalmic ointment Place into both eyes every 4 (four) hours as needed for dry eyes. (Patient not taking: Reported on 02/19/2023)      ascorbic acid (VITAMIN C) 500 MG tablet Take 1 tablet (500 mg total) by mouth 2 (two) times daily. (Patient not taking: Reported on 02/19/2023)      cholecalciferol (VITAMIN D3) 25 MCG (1000 UNIT) tablet Take 1,000 Units by mouth daily. (Patient not taking: Reported on 02/19/2023)      diphenoxylate-atropine (LOMOTIL) 2.5-0.025 MG tablet Take 2 tablets by mouth 4 (four) times daily.      famotidine (PEPCID) 40 MG/5ML suspension Take 2.5 mLs (20 mg total) by mouth daily. (Patient not taking: Reported  on 02/19/2023)      ferrous sulfate 300 (60 Fe) MG/5ML syrup Take 5 mLs (300 mg total) by mouth 2 (two) times daily.      fiber (NUTRISOURCE FIBER) PACK packet Take 1 packet by mouth 3 (three) times daily. (Patient not taking: Reported on 02/19/2023)      food thickener (SIMPLYTHICK, NECTAR/LEVEL 2/MILDLY THICK,) GEL Take 1 packet by mouth as needed. (Patient not taking: Reported on 02/19/2023)      gabapentin (NEURONTIN) 250 MG/5ML solution Take 4 mLs (200 mg total) by mouth every 8 (eight) hours. (Patient not taking: Reported on 02/19/2023)      insulin aspart (NOVOLOG) 100 UNIT/ML injection Inject 0-9 Units into the skin 3 (three) times daily with meals. (Patient not taking:  Reported on 02/11/2023)      lip balm (CARMEX) ointment Apply topically as needed. (Patient not taking: Reported on 02/11/2023)      loperamide (IMODIUM A-D) 2 MG tablet Take 1 tablet (2 mg total) by mouth 4 (four) times daily as needed for diarrhea or loose stools. 30 tablet 0    metoprolol tartrate (LOPRESSOR) 25 MG tablet Take 1 tablet (25 mg total) by mouth 2 (two) times daily. 60 tablet 0    Multiple Vitamin (MULTIVITAMIN) LIQD Place 15 mLs into feeding tube daily. (Patient not taking: Reported on 02/19/2023)      Nutritional Supplements (FEEDING SUPPLEMENT, KATE FARMS STANDARD 1.4,) LIQD liquid Take 325 mLs by mouth 2 (two) times daily between meals. (Patient not taking: Reported on 02/11/2023)      Nutritional Supplements (FEEDING SUPPLEMENT, KATE FARMS STANDARD 1.4,) LIQD liquid Take 360 mLs by mouth daily. (Patient not taking: Reported on 02/11/2023)      Nutritional Supplements (FEEDING SUPPLEMENT, KATE FARMS STANDARD 1.4,) LIQD liquid Take 325 mLs by mouth 2 (two) times daily between meals. (Patient not taking: Reported on 02/11/2023)      nystatin (MYCOSTATIN) 100000 UNIT/ML suspension Take 5 mLs (500,000 Units total) by mouth 4 (four) times daily. (Patient not taking: Reported on 02/11/2023) 60 mL 0    Opium 10 MG/ML (1%) TINC Place 1 mL (10 mg total) into feeding tube 4 (four) times daily. (Patient not taking: Reported on 02/19/2023)      oxyCODONE (OXY IR/ROXICODONE) 5 MG immediate release tablet Take 1-2 tablets (5-10 mg total) by mouth every 4 (four) hours as needed for breakthrough pain. (Patient not taking: Reported on 02/19/2023)      Vitamin D, Ergocalciferol, (DRISDOL) 1.25 MG (50000 UNIT) CAPS capsule Take 1 capsule (50,000 Units total) by mouth every 7 (seven) days. (Patient not taking: Reported on 02/19/2023)      Zinc 220 (50 Zn) MG CAPS Take 1 capsule by mouth daily.      No current facility-administered medications for this encounter.   ROS  Review of Systems  Constitutional:   Positive for fatigue.  Gastrointestinal:        LMQ ileostomy  Skin:  Positive for color change and rash.       Peristomal breakdown  Psychiatric/Behavioral:  Positive for dysphoric mood.   All other systems reviewed and are negative.  Vital signs:  BP 111/89 (BP Location: Right Arm)   Pulse (!) 132   Temp 98.7 F (37.1 C) (Oral)   Resp 20   LMP 04/24/2021   SpO2 100%  Exam:  Physical Exam Vitals reviewed.  Constitutional:      Appearance: Normal appearance.  HENT:     Mouth/Throat:     Mouth: Mucous  membranes are moist.     Comments: Is encouraged to increase fluids and replace lost electrolytes through beverages. She has been doing this.  Abdominal:     Palpations: Abdomen is soft.  Skin:    General: Skin is warm and dry.     Findings: Erythema and rash present.  Neurological:     Mental Status: She is alert.  Psychiatric:        Behavior: Behavior normal.     Comments: Tearful     Stoma type/location:  LMQ ileostomy Stomal assessment/size:  1" flush  Peristomal assessment:  denuded skin circumferentially.  Barrier was cut too large when I removed pouch today and effluent has been in contact with skin.  Treatment options for stomal/peristomal skin: stoma powder and skin prep  barrier ring and 1 3/4" pouch (sizing down) with a convex barrier.   Also recommend an ostomy belt  Output: liquid green stool Ostomy pouching: 2pc. 1 3/4" pouch with barrier ring  stoma powder and skin prep  ostomy belt Education provided:  performed pouch change.  Demonstrated stoma powder and skin prep.  Measuring stoma and convex barrier vs flat.  Recommend adding ostomy belt.   Midline wound present.  Patient independent in changing this dressing.     Impression/dx  Ileostomy Contact dermatitis Discussion  Provided additional pouches barrier rings powder and skin prep  Emotional support provided.  We are here with patient and will support her through this season.  She states she  understands her care  much better now.  Plan  Will update orders with [facility].      Visit time: 55 minutes.   Maple Hudson FNP-BC

## 2023-02-20 NOTE — Discharge Instructions (Addendum)
We are switching to a 2 piece  1 3/4" pouch system. Convex , Beige with filter  velcro closure Stoma powder and skin prep  Barrier ring  Lubricating deodorant

## 2023-02-20 NOTE — Telephone Encounter (Signed)
Patient left voicemail wanting update on JP drain removal. [facility] contacted IR yesterday and they will be in touch with patient once CT is approved. Relayed this to [place]. Patient verbalized understanding and has no further questions.   Sandie Ano, RN

## 2023-02-21 ENCOUNTER — Other Ambulatory Visit (HOSPITAL_COMMUNITY): Payer: Self-pay

## 2023-02-27 ENCOUNTER — Ambulatory Visit (HOSPITAL_COMMUNITY): Payer: Managed Care, Other (non HMO) | Admitting: Nurse Practitioner

## 2023-03-07 ENCOUNTER — Ambulatory Visit (HOSPITAL_COMMUNITY)
Admission: RE | Admit: 2023-03-07 | Discharge: 2023-03-07 | Disposition: A | Discharge status: Home / Self Care | Payer: Managed Care, Other (non HMO) | Source: Ambulatory Visit | Attending: Radiology | Admitting: Radiology

## 2023-03-07 ENCOUNTER — Other Ambulatory Visit (HOSPITAL_COMMUNITY): Payer: Self-pay | Admitting: Radiology

## 2023-03-07 DIAGNOSIS — K50012 Crohn's disease of small intestine with intestinal obstruction: Secondary | ICD-10-CM

## 2023-03-07 DIAGNOSIS — K5010 Crohn's disease of large intestine without complications: Secondary | ICD-10-CM

## 2023-03-07 DIAGNOSIS — R188 Other ascites: Secondary | ICD-10-CM

## 2023-03-07 DIAGNOSIS — Z4803 Encounter for change or removal of drains: Secondary | ICD-10-CM | POA: Insufficient documentation

## 2023-03-07 DIAGNOSIS — R109 Unspecified abdominal pain: Secondary | ICD-10-CM

## 2023-03-07 HISTORY — PX: IR CATHETER TUBE CHANGE: IMG717

## 2023-03-07 MED ORDER — LIDOCAINE HCL 1 % IJ SOLN
20.0000 mL | Freq: Once | INTRAMUSCULAR | Status: AC
  Administered 2023-03-07: 7 mL via INTRADERMAL

## 2023-03-07 MED ORDER — IOHEXOL 300 MG/ML  SOLN
50.0000 mL | Freq: Once | INTRAMUSCULAR | Status: AC | PRN
  Administered 2023-03-07: 15 mL

## 2023-03-07 MED ORDER — LIDOCAINE HCL 1 % IJ SOLN
INTRAMUSCULAR | Status: AC
  Filled 2023-03-07: qty 20

## 2023-03-07 NOTE — Procedures (Signed)
Interventional Radiology Procedure Note  Procedure:  Image guided drain injection and exchange of RLQ percutaneous drain.   Complications: None Findings: Residual abscess. Drain was exchanged.  Recommendations:  - 2-3 week interval appointment with CT abd/pelvis with contrast.  With appointment.  She would prefer outpatient center/clinic rather than hospital.  - continue drain care, with log of output.  No flushes needed - Current surgical care  Signed,  Yvone Neu. Loreta Ave, DO

## 2023-03-07 NOTE — Progress Notes (Signed)
Chief Complaint: Patient was seen in consultation today for RLQ abdominal fluid collection  at the request of Allred,Darrell K  Referring Physician(s): Melody Haver, MD  Supervising Physician: Gilmer Mor  History of Present Illness: [name] is a 47 y.o. 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  [name] states she has not been flushing the drain as she was not instructed to do so on discharge, she is not sure how much output she has had from the drain - she states that it hasn't put out hardly anything in 2 weeks. Fluid has been thick, brown in color. No leakage from the drain, constant pain at drain insertion site. No redness or swelling at the site. No fevers, chills, significant abdominal pain, n/v. She continues on TPN. She saw her surgeon earlier this month and is unsure when she will see them again. She had a CT scan done recently at [facility] to follow up. She is hoping the drain can come out today.  Past Medical History:  Diagnosis Date   Breast cancer (HCC)    right   Colon stricture (HCC) 05/07/2019   Colonic stricture (HCC) 11/19/2022   Crohn's colitis, with intestinal obstruction (HCC) 09/17/2013   Dx 2006 - right and left colon involved 2011 - pan-colitis 09/17/2013 left colitis 60-20 cm March 2021 subtotal colectomy ileosigmoid anastomosis for colonic strictures-Dr. Maisie Fus     Crohn's disease [facility])    Exacerbation of Crohn's disease of large intestine (HCC) 12/13/2022   Family history of breast cancer 06/11/2020   History of abdominal subtotal colectomy 11/19/2022   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 10/17/2010   Rectal  bleeding    Uterine fibroid    Vitamin D deficiency 09/24/2013    Past Surgical History:  Procedure Laterality Date   AXILLARY LYMPH NODE DISSECTION Right 11/09/2020   Procedure: RIGHT AXILLARY LYMPH NODE DISSECTION;  Surgeon: Emelia Loron, MD;  Location: [facility];  Service: General;  Laterality: Right;   BIOPSY  07/01/2020   Procedure: BIOPSY;  Surgeon: Benancio Deeds, MD;  Location: [facility];  Service: Gastroenterology;;   BIOPSY  01/27/2021   Procedure: BIOPSY;  Surgeon: Benancio Deeds, MD;  Location: [facility];  Service: Gastroenterology;;   BIOPSY  09/07/2021   Procedure: BIOPSY;  Surgeon: Lynann Bologna, MD;  Location: [facility];  Service: Gastroenterology;;   BIOPSY  11/21/2022   Procedure: BIOPSY;  Surgeon: Lemar Lofty., MD;  [facility];  Service: Gastroenterology;;   BREAST BIOPSY Right 03/24/2022   [place] RT BREAST BX W LOC DEV 1ST LESION IMG BX SPEC US GUIDE 03/24/2022 [facility]   BREAST BIOPSY  04/28/2022   [place] RT RADIOACTIVE SEED LOC 04/28/2022 [facility]   BREAST IMPLANT REMOVAL Right 08/07/2022   Procedure: REMOVAL  OF RIGHT BREAST IMPLANTS;  Surgeon: Allena Napoleon, MD;  Location: [facility];  Service: Plastics;  Laterality: Right;   BREAST RECONSTRUCTION WITH PLACEMENT OF TISSUE EXPANDER AND FLEX HD (ACELLULAR HYDRATED DERMIS) Bilateral 11/09/2020   Procedure: BILATERAL BREAST RECONSTRUCTION WITH PLACEMENT OF TISSUE EXPANDER AND FLEX HD (ACELLULAR HYDRATED DERMIS);  Surgeon: Allena Napoleon, MD;  Location: [facility];  Service: Plastics;  Laterality:  Bilateral;   CAPSULOTOMY Bilateral 07/25/2021   Procedure: CAPSULOTOMY;  Surgeon: Allena Napoleon, MD;  Location: [facility];  Service: Plastics;  Laterality: Bilateral;   COLECTOMY  06/27/2019   COLONOSCOPY  2015   ESOPHAGOGASTRODUODENOSCOPY (EGD) WITH PROPOFOL N/A 11/21/2022   Procedure:  ESOPHAGOGASTRODUODENOSCOPY (EGD) WITH PROPOFOL;  Surgeon: Lemar Lofty., MD;  [facility];  Service: Gastroenterology;  Laterality: N/A;   FLEXIBLE SIGMOIDOSCOPY N/A 07/01/2020   Procedure: FLEXIBLE SIGMOIDOSCOPY;  Surgeon: Benancio Deeds, MD;  [facility];  Service: Gastroenterology;  Laterality: N/A;   FLEXIBLE SIGMOIDOSCOPY N/A 01/27/2021   Procedure: FLEXIBLE SIGMOIDOSCOPY;  Surgeon: Benancio Deeds, MD;  [facility];  Service: Gastroenterology;  Laterality: N/A;   FLEXIBLE SIGMOIDOSCOPY N/A 09/07/2021   Procedure: FLEXIBLE SIGMOIDOSCOPY;  Surgeon: Lynann Bologna, MD;  [facility];  Service: Gastroenterology;  Laterality: N/A;   FLEXIBLE SIGMOIDOSCOPY N/A 11/21/2022   Procedure: FLEXIBLE SIGMOIDOSCOPY;  Surgeon: Meridee Score Netty Starring., MD;  Location: [facility];  Service: Gastroenterology;  Laterality: N/A;   IR FLUORO GUIDE CV LINE RIGHT  01/09/2023   IR US GUIDE VASC ACCESS RIGHT  01/09/2023   LAPAROTOMY N/A 12/14/2022   Procedure: EXPLORATORY LAPAROTOMY WITH SMALL BOWEL RESECTION;  Surgeon: Moise Boring, MD;  [facility];  Service: General;  Laterality: N/A;   LAPAROTOMY N/A 12/16/2022   Procedure: REOPENING LAPAROTOMY, ILEOSTOMY;  ABDOMINAL CLOSURE;  Surgeon: Moise Boring, MD;  Location[facility];  Service: General;  Laterality: N/A;   NIPPLE SPARING MASTECTOMY Bilateral 11/09/2020   Procedure: BILATERAL NIPPLE SPARING MASTECTOMY;  Surgeon: Emelia Loron, MD;  Location: [facility];  Service: General;  Laterality: Bilateral;   PORT-A-CATH REMOVAL N/A 07/25/2021   Procedure: REMOVAL PORT-A-CATH;  Surgeon: Allena Napoleon, MD;  Location: [facility];  Service: Plastics;  Laterality: N/A;   PORT-A-CATH REMOVAL Left 01/05/2023   Procedure: REMOVAL PORT-A-CATH;  Surgeon: Abigail Miyamoto, MD;  Location[facility];  Service: General;  Laterality: Left;   PORTACATH PLACEMENT N/A 06/23/2020    Procedure: INSERTION PORT-A-CATH;  Surgeon: Emelia Loron, MD;  Location: [facility];  Service: General;  Laterality: N/A;  START TIME OF 11:00 AM FOR 60 MINUTES [place] [facility]   PORTACATH PLACEMENT Left 05/02/2022   Procedure: INSERTION PORT-A-CATH;  Surgeon: Emelia Loron, MD;  Location: [facility];  Service: General;  Laterality: Left;   RADIOACTIVE SEED GUIDED EXCISIONAL BREAST BIOPSY Right 05/02/2022   Procedure: RADIOACTIVE SEED GUIDED RIGHT BREAST MASS EXCISION;  Surgeon: Emelia Loron, MD;  Location: [facility];  Service: General;  Laterality: Right;   REMOVAL OF BILATERAL TISSUE EXPANDERS WITH PLACEMENT OF BILATERAL BREAST IMPLANTS Bilateral 07/25/2021   Procedure: REMOVAL OF BILATERAL TISSUE EXPANDERS WITH PLACEMENT OF BILATERAL BREAST IMPLANTS;  Surgeon: Allena Napoleon, MD;  Location: [facility];  Service: Plastics;  Laterality: Bilateral;  1.5    Allergies: Nsaids, Chlorhexidine gluconate, Hydromorphone, and Lactose intolerance (gi)  Medications: Prior to Admission medications   Medication Sig Start Date End Date Taking? Authorizing Provider  acetaminophen (TYLENOL) 500 MG tablet Take 500-1,000 mg by mouth every 6 (six) hours as needed for mild pain. Patient not taking: Reported on 02/19/2023    [provider]  albuterol (PROVENTIL) (2.5 MG/3ML) 0.083% nebulizer solution Take 3 mLs (2.5 mg total) by nebulization every 4 (four) hours as needed for wheezing or shortness of breath. Patient not taking: Reported on 02/19/2023 01/12/23   Kathlen Mody, MD  ALPRAZolam (  XANAX) 0.25 MG tablet Take 1 tablet (0.25 mg total) by mouth 2 (two) times daily. Patient not taking: Reported on 02/19/2023 01/12/23   Kathlen Mody, MD  amoxicillin-clavulanate (AUGMENTIN) 875-125 MG tablet Take 1 tablet by mouth 2 (two) times daily. 02/19/23   Danelle Earthly, MD  apixaban (ELIQUIS) 5 MG TABS tablet Take 5 mg by mouth 2 (two)  times daily. Patient not taking: Reported on 02/19/2023    [provider]  artificial tears (LACRILUBE) OINT ophthalmic ointment Place into both eyes every 4 (four) hours as needed for dry eyes. Patient not taking: Reported on 02/19/2023 01/12/23   Kathlen Mody, MD  ascorbic acid (VITAMIN C) 500 MG tablet Take 1 tablet (500 mg total) by mouth 2 (two) times daily. Patient not taking: Reported on 02/19/2023 01/12/23   Kathlen Mody, MD  cholecalciferol (VITAMIN D3) 25 MCG (1000 UNIT) tablet Take 1,000 Units by mouth daily. Patient not taking: Reported on 02/19/2023    [provider]  diphenoxylate-atropine (LOMOTIL) 2.5-0.025 MG tablet Take 2 tablets by mouth 4 (four) times daily. 01/12/23   Kathlen Mody, MD  famotidine (PEPCID) 40 MG/5ML suspension Take 2.5 mLs (20 mg total) by mouth daily. Patient not taking: Reported on 02/19/2023 01/13/23   Kathlen Mody, MD  ferrous sulfate 300 (60 Fe) MG/5ML syrup Take 5 mLs (300 mg total) by mouth 2 (two) times daily. 01/16/23   Kathlen Mody, MD  fiber (NUTRISOURCE FIBER) PACK packet Take 1 packet by mouth 3 (three) times daily. Patient not taking: Reported on 02/19/2023 01/12/23   Kathlen Mody, MD  food thickener (SIMPLYTHICK, NECTAR/LEVEL 2/MILDLY THICK,) GEL Take 1 packet by mouth as needed. Patient not taking: Reported on 02/19/2023 01/12/23   Kathlen Mody, MD  gabapentin (NEURONTIN) 250 MG/5ML solution Take 4 mLs (200 mg total) by mouth every 8 (eight) hours. Patient not taking: Reported on 02/19/2023 01/12/23   Kathlen Mody, MD  insulin aspart (NOVOLOG) 100 UNIT/ML injection Inject 0-9 Units into the skin 3 (three) times daily with meals. Patient not taking: Reported on 02/11/2023 01/12/23   Kathlen Mody, MD  lip balm (CARMEX) ointment Apply topically as needed. Patient not taking: Reported on 02/11/2023 01/12/23   Kathlen Mody, MD  loperamide (IMODIUM A-D) 2 MG tablet Take 1 tablet (2 mg total) by mouth 4 (four) times daily as needed  for diarrhea or loose stools. 02/12/23   Rolly Salter, MD  metoprolol tartrate (LOPRESSOR) 25 MG tablet Take 1 tablet (25 mg total) by mouth 2 (two) times daily. 02/12/23   Rolly Salter, MD  Multiple Vitamin (MULTIVITAMIN) LIQD Place 15 mLs into feeding tube daily. Patient not taking: Reported on 02/19/2023 01/17/23   Kathlen Mody, MD  Nutritional Supplements (FEEDING SUPPLEMENT, KATE FARMS STANDARD 1.4,) LIQD liquid Take 325 mLs by mouth 2 (two) times daily between meals. Patient not taking: Reported on 02/11/2023 01/12/23   Kathlen Mody, MD  Nutritional Supplements (FEEDING SUPPLEMENT, KATE FARMS STANDARD 1.4,) LIQD liquid Take 360 mLs by mouth daily. Patient not taking: Reported on 02/11/2023 01/12/23   Kathlen Mody, MD  Nutritional Supplements (FEEDING SUPPLEMENT, KATE FARMS STANDARD 1.4,) LIQD liquid Take 325 mLs by mouth 2 (two) times daily between meals. Patient not taking: Reported on 02/11/2023 01/16/23   Kathlen Mody, MD  nystatin (MYCOSTATIN) 100000 UNIT/ML suspension Take 5 mLs (500,000 Units total) by mouth 4 (four) times daily. Patient not taking: Reported on 02/11/2023 01/16/23   Kathlen Mody, MD  Opium 10 MG/ML (1%) TINC Place 1 mL (10  mg total) into feeding tube 4 (four) times daily. Patient not taking: Reported on 02/19/2023 01/16/23   Kathlen Mody, MD  oxyCODONE (OXY IR/ROXICODONE) 5 MG immediate release tablet Take 1-2 tablets (5-10 mg total) by mouth every 4 (four) hours as needed for breakthrough pain. Patient not taking: Reported on 02/19/2023 01/12/23   Kathlen Mody, MD  Vitamin D, Ergocalciferol, (DRISDOL) 1.25 MG (50000 UNIT) CAPS capsule Take 1 capsule (50,000 Units total) by mouth every 7 (seven) days. Patient not taking: Reported on 02/19/2023 01/18/23   Kathlen Mody, MD  Zinc 220 (50 Zn) MG CAPS Take 1 capsule by mouth daily. 02/12/23   [provider]     Family History  Problem Relation Age of Onset   Diabetes Father    Breast cancer Maternal Aunt         dx unknown age   Diabetes Paternal Aunt    Breast cancer Cousin        maternal cousin; dx unknown age   Esophageal cancer Neg Hx    Rectal cancer Neg Hx    Stomach cancer Neg Hx    Colon polyps Neg Hx    Colon cancer Neg Hx     Social History   Socioeconomic History   Marital status: Single    Spouse name: Not on file   Number of children: 1   Years of education: 13   Highest education level: Not on file  Occupational History   Occupation: Medical Coder   Tobacco Use   Smoking status: Former    Types: Cigarettes   Smokeless tobacco: Never  Vaping Use   Vaping status: Never Used  Substance and Sexual Activity   Alcohol use: Not Currently    Alcohol/week: 1.0 - 2.0 standard drink of alcohol    Types: 1 - 2 Glasses of wine per week   Drug use: No   Sexual activity: Yes    Partners: Male    Comment: Intermittent protection not consistent  Other Topics Concern   Not on file  Social History Narrative   Single, 1 daughter born 2001 approximately   Energy manager for [employer]   She is a former smoker, occasional alcohol no drug use   Social Determinants of Corporate investment banker Strain: Not on file  Food Insecurity: No Food Insecurity (11/21/2022)   Hunger Vital Sign    Worried About Running Out of Food in the Last Year: Never true    Ran Out of Food in the Last Year: Never true  Transportation Needs: No Transportation Needs (11/21/2022)   PRAPARE - Administrator, Civil Service (Medical): No    Lack of Transportation (Non-Medical): No  Physical Activity: Not on file  Stress: Not on file  Social Connections: Not on file    Review of Systems: A 12 point ROS discussed and pertinent positives are indicated in the HPI above.  All other systems are negative.  Review of Systems  Constitutional:  Negative for chills and fever.  Respiratory:  Negative for cough and shortness of breath.   Cardiovascular:  Negative for chest pain.  Gastrointestinal:   Negative for abdominal pain, diarrhea, nausea and vomiting.    Vital Signs: LMP 04/24/2021   Physical Exam Constitutional:      General: She is not in acute distress. HENT:     Head: Normocephalic.  Cardiovascular:     Rate and Rhythm: Normal rate.  Pulmonary:     Effort: Pulmonary effort is normal.  Abdominal:     Palpations: Abdomen is soft.     Comments: (+) RLQ to suction bulb with ~5 cc thick, brown output in bulb - per patient this is consistent with appearance of fluid for last 2 weeks. Insertion site unremarkable. No retention suture or stat lock in place.  Skin:    General: Skin is warm and dry.  Neurological:     Mental Status: She is alert and oriented to person, place, and time.      Imaging: CT ANGIO GI BLEED  Result Date: 02/11/2023 CLINICAL DATA:  Lower GI bleed EXAM: CTA ABDOMEN AND PELVIS WITHOUT AND WITH CONTRAST TECHNIQUE: Multidetector CT imaging of the abdomen and pelvis was performed using the standard protocol during bolus administration of intravenous contrast. Multiplanar reconstructed images and MIPs were obtained and reviewed to evaluate the vascular anatomy. RADIATION DOSE REDUCTION: This exam was performed according to the departmental dose-optimization program which includes automated exposure control, adjustment of the mA and/or kV according to patient size and/or use of iterative reconstruction technique. CONTRAST:  OMNIPAQUE IOHEXOL 350 MG/ML SOLN COMPARISON:  12/31/2022 FINDINGS: VASCULAR Aorta: Normal caliber aorta without aneurysm, dissection, vasculitis or significant stenosis. Celiac: Patent without evidence of aneurysm, dissection, vasculitis or significant stenosis. SMA: Patent without evidence of aneurysm, dissection, vasculitis or significant stenosis. Renals: Both renal arteries are patent without evidence of aneurysm, dissection, vasculitis, fibromuscular dysplasia or significant stenosis. IMA: Patent without evidence of aneurysm,  dissection, vasculitis or significant stenosis. Inflow: Patent without evidence of aneurysm, dissection, vasculitis or significant stenosis. Proximal Outflow: Bilateral common femoral and visualized portions of the superficial and profunda femoral arteries are patent without evidence of aneurysm, dissection, vasculitis or significant stenosis. Veins: No obvious venous abnormality within the limitations of this arterial phase study. Review of the MIP images confirms the above findings. NON-VASCULAR Lower chest: No acute findings Hepatobiliary: No focal hepatic abnormality. Gallbladder unremarkable. Pancreas: No focal abnormality or ductal dilatation. Spleen: No focal abnormality.  Normal size. Adrenals/Urinary Tract: No adrenal abnormality. No focal renal abnormality. No stones or hydronephrosis. Urinary bladder is unremarkable. Stomach/Bowel: Left abdominal ostomy, status post partial colectomy and small bowel resection. Contrast material is seen within the Hartmann's pouch, therefore cannot assess for active GI bleed. No other areas of active contrast extravasation. No bowel obstruction. Lymphatic: No evidence of aneurysm or adenopathy. Reproductive: Enlarged fibroid uterus. Other: Pigtail drainage catheter is in the midline of the pelvis with large residual fluid collection measuring 12 x 3 cm, decreased significantly in size. This previously measured 20 x 10 cm. Musculoskeletal: No acute bony abnormality. IMPRESSION: VASCULAR No visible active GI bleed. Contrast material noted within the Hartmann's pouch. Therefore cannot assess for active contrast extravasation. NON-VASCULAR Postoperative changes from partial colectomy with left lower quadrant ostomy. No bowel obstruction. Large fluid collection centrally in the pelvis with pigtail drainage catheter in place. Fluid collection has significantly decreased in size since prior study. Electronically Signed   By: Charlett Nose M.D.   On: 02/11/2023 23:07   DG Chest  Portable 1 View  Result Date: 02/11/2023 CLINICAL DATA:  PICC line placement EXAM: PORTABLE CHEST 1 VIEW COMPARISON:  01/03/2023 FINDINGS: Right internal jugular central venous catheter tip noted at the superior cavoatrial junction. Previously identified right subclavian central venous catheter and left internal jugular chest port have been removed. Opacity overlying the left lung base relates to breast prosthesis. Lungs are clear. No pneumothorax or pleural effusion. Cardiac size within normal limits. Pulmonary vascularity is normal. No acute bone  abnormality. Surgical clips are seen within the right axilla. IMPRESSION: 1. Right internal jugular central venous catheter tip at the superior cavoatrial junction. Electronically Signed   By: Helyn Numbers M.D.   On: 02/11/2023 20:19    Labs:  CBC: Recent Labs    02/11/23 1733 02/11/23 2047 02/12/23 0535 02/12/23 1026 02/19/23 1549  WBC 10.6*  --  7.4 7.3 6.8  HGB 10.8* 9.5* 8.9* 9.2* 11.5*  HCT 32.2* 29.9* 27.1* 27.7* 36.5  PLT 361  --  298 285 342    COAGS: Recent Labs    09/28/22 0350 11/20/22 0252 12/22/22 1343 12/23/22 0520 12/24/22 0408 12/30/22 1338 01/11/23 1143 02/11/23 1733  INR 1.0   < > 1.3*   < > 1.3* 1.3* 1.2 1.1  APTT 28  --  33  --  36 38*  --   --    < > = values in this interval not displayed.    BMP: Recent Labs    01/13/23 0306 01/15/23 0313 02/11/23 1733 02/12/23 0535 02/19/23 1549  NA 131* 136 136 134* 136  K 4.2 4.1 3.7 3.4* 4.2  CL 98 103 102 100 99  CO2 24 26 23 26 24   GLUCOSE 152* 134* 96 91 81  BUN 28* 31* 25* 17 29*  CALCIUM 8.3* 8.3* 9.5 8.7* 9.7  CREATININE 0.71 0.66 0.72 0.63 0.72  GFRNONAA >60 >60 >60 >60  --     LIVER FUNCTION TESTS: Recent Labs    01/12/23 0601 01/15/23 0313 02/11/23 1733 02/12/23 0535 02/19/23 1549  BILITOT 0.4 0.3 0.4 1.0 0.5  AST 28 43* 28 27 44*  ALT 21 29 19 17  30*  ALKPHOS 113 104 107 89  --   PROT 6.7 6.4* 8.3* 6.8 7.8  ALBUMIN 2.1* 2.0* 3.5 2.7*   --     TUMOR MARKERS: No results for input(s): "AFPTM", "CEA", "CA199", "CHROMGRNA" in the last 8760 hours.  Assessment:  47 y/o F with history of Crohn's disease s/p multiple resections with intra-abdominal fluid collection noted on CT  01/10/23 ultimately requiring RLQ drain placement in IR 01/11/23 who presents today for drain follow up.  [name] states she was not instructed to flush the drain at discharge, as such the drain has not been flushed for over 1 month. Output has been minimal for ~2 weeks per her account with small amount of thick brown output in the bulb which is consistent with fluid appearance for the last 10 weeks. Drain site is painful but otherwise no significant abdominal pain, n/v, fevers, chills at home. Recently had follow up CT abd/pelvis at [facility] however we are unable to see those images today for review.   Discussed with Dr. Loreta Ave - plan to proceed with drain injection and possible removal/exchange/upsize today based on drain injection findings which patient is agreeable to.  Risks and benefits discussed with the patient including bleeding, infection, damage to adjacent structures, bowel perforation/fistula connection, and sepsis.  All of the patient's questions were answered, patient is agreeable to proceed.  Consent signed and in chart.  Electronically Signed: Villa Herb PA-C 03/07/2023, 11:33 AM   Please refer to Dr. Kenna Gilbert attestation of this note for management and plan.

## 2023-03-09 ENCOUNTER — Telehealth: Payer: Self-pay | Admitting: Internal Medicine

## 2023-03-09 ENCOUNTER — Encounter: Payer: Self-pay | Admitting: Internal Medicine

## 2023-03-09 NOTE — Telephone Encounter (Signed)
I received copies of labs.  I have reviewed these.  There are no significant abnormalities noted.  These look like they are intended for management of her TPN.  I am not going to be managing TPN.  It looks like Dr. Hillery Hunter of [facility] surgery put a referral in and there is a pending MyChart message as well.  She should contact Dr. Hillery Hunter about that referral to [facility] to see Dr. Waynard Reeds regarding short-bowel syndrome and TPN management.  If she thinks these labs should go to another physician we can forward as well.  They are on my desk in my in basket.  I can see her back regarding management of Crohn's disease though I do not think we would be starting any therapy.  She could have a next available appointment regarding that to discuss things.  She is still healing from her surgery so it can wait.

## 2023-03-13 NOTE — Telephone Encounter (Signed)
Pt made aware of Dr. Gessner recommendations: Pt verbalized understanding with all questions answered.   

## 2023-03-14 LAB — LAB REPORT - SCANNED: EGFR: 75

## 2023-03-15 NOTE — Telephone Encounter (Signed)
Refer to phone note 03/09/23.

## 2023-03-20 ENCOUNTER — Telehealth: Payer: Self-pay

## 2023-03-20 NOTE — Telephone Encounter (Signed)
Documentation below should read :  I notified [name]. PharmD that Dr. Leone Payor is not the prescribing MD for the TPN and will not be signing the orders.

## 2023-03-20 NOTE — Telephone Encounter (Signed)
[name], and Dr. Leone Payor- FYI   Orders were received from [facility] in [facility] [phone] for TPN orders.  I notified Liana Gerold. PharmD that Dr. Leone Payor is the prescribing MD for the TPN and will not be signing the orders.  I provided Jill Alexanders with the name of Dr. Hardin Negus at [facility] and Dr. Hillery Hunter at [facility], two other providers that may be the source of the TPBN orders.  Jill Alexanders thanked me for the help and information and will remove Dr. Leone Payor as the prescribing MD for the TPN.

## 2023-03-21 ENCOUNTER — Encounter (HOSPITAL_BASED_OUTPATIENT_CLINIC_OR_DEPARTMENT_OTHER): Payer: Managed Care, Other (non HMO) | Admitting: Internal Medicine

## 2023-03-23 LAB — LAB REPORT - SCANNED: EGFR: 97

## 2023-03-26 ENCOUNTER — Ambulatory Visit: Payer: Managed Care, Other (non HMO) | Admitting: Internal Medicine

## 2023-03-28 ENCOUNTER — Other Ambulatory Visit: Payer: Self-pay | Admitting: Internal Medicine

## 2023-03-28 DIAGNOSIS — K651 Peritoneal abscess: Secondary | ICD-10-CM

## 2023-04-06 ENCOUNTER — Other Ambulatory Visit: Payer: Managed Care, Other (non HMO)

## 2023-04-11 NOTE — Progress Notes (Signed)
 Referring Physician(s): Dr. 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 She has been hospitalized multiple times this year for abdominal pain related to her Crohn's and she had a lengthy hospitalization 9/3-24-01/16/23 for abdominal pain. She presented to the ED 9/3 with abdominal distention, nausea and vomiting. CT showed markedly dilated distal small bowel consistent with chronic obstruction. She was admitted and the next day she was found pulseless on the floor in ED bathroom. The estimated down time was 5 minutes.   She was taken to the OR 9/5 for an exploratory laparotomy where she was noted to have an internal hernia with associated small bowel ischemia. She underwent a small bowel resection and she returned to the OR 9/7 for ostomy placement.  Subsequent imaging showed ascites and IR performed a paracentesis 9/23. A CT scan 10/2 showed a large central fluid collection and she was seen in IR 10/3 for percutaneous RLQ drain placement.  She was discharged to a long term care facility 01/16/23 and followed up with IR 03/07/23. She states she was not instructed to flush the drain and output had been minimal for several weeks. A drain injection showed a residual abscess and the drain was exchanged/upsized.   She presents today to the Interventional Radiology outpatient clinic for a follow up drain evaluation with CT imaging and possible drain injection under fluoroscopy. She last followed up with General Surgery 03/21/23.  She has had no significant output from the drain.  The  drain has remained to bulb suction.  No recent fevers, chills, or abdominal pain.  Past Medical History:  Diagnosis Date   Breast cancer (HCC)    right   Colon stricture (HCC) 05/07/2019   Colonic stricture (HCC) 11/19/2022   Crohn's colitis, with intestinal obstruction (HCC) 09/17/2013   Dx 2006 - right and left colon involved 2011 - pan-colitis 09/17/2013 left colitis 60-20 cm March 2021 subtotal colectomy ileosigmoid anastomosis for colonic strictures-Dr. Debby     Crohn's disease [facility])    Exacerbation of Crohn's disease of large intestine (HCC) 12/13/2022   Family history of breast cancer 06/11/2020   History of abdominal subtotal colectomy 11/19/2022   Iron  deficiency anemia secondary to blood loss (chronic) - Crohn's colitis 10/17/2010   Rectal bleeding    Uterine fibroid    Vitamin D  deficiency 09/24/2013    Past Surgical History:  Procedure Laterality Date   AXILLARY LYMPH NODE DISSECTION Right 11/09/2020   Procedure: RIGHT AXILLARY LYMPH NODE DISSECTION;  Surgeon: Ebbie Cough, MD;  Location: [facility];  Service: General;  Laterality: Right;   BIOPSY  07/01/2020   Procedure: BIOPSY;  Surgeon: Leigh Elspeth SQUIBB, MD;  Location: [facility];  Service: Gastroenterology;;   BIOPSY  01/27/2021   Procedure: BIOPSY;  Surgeon: Leigh Elspeth SQUIBB, MD;  Location: [facility];  Service: Gastroenterology;;   BIOPSY  09/07/2021   Procedure: BIOPSY;  Surgeon: Charlanne Groom, MD;  Location: [facility];  Service: Gastroenterology;;   BIOPSY  11/21/2022   Procedure: BIOPSY;  Surgeon: Wilhelmenia Aloha Raddle., MD;  Location: [facility];  Service: Gastroenterology;;   BREAST BIOPSY Right 03/24/2022   US  RT BREAST  BX W LOC DEV 1ST LESION IMG BX SPEC US  GUIDE 03/24/2022 [facility]   BREAST BIOPSY  04/28/2022   US  RT RADIOACTIVE SEED LOC 04/28/2022 [facility]   BREAST IMPLANT REMOVAL Right 08/07/2022   Procedure: REMOVAL  OF RIGHT BREAST IMPLANTS;   Surgeon: Elisabeth Craig RAMAN, MD;  Location: [facility];  Service: Plastics;  Laterality: Right;   BREAST RECONSTRUCTION WITH PLACEMENT OF TISSUE EXPANDER AND FLEX HD (ACELLULAR HYDRATED DERMIS) Bilateral 11/09/2020   Procedure: BILATERAL BREAST RECONSTRUCTION WITH PLACEMENT OF TISSUE EXPANDER AND FLEX HD (ACELLULAR HYDRATED DERMIS);  Surgeon: Elisabeth Craig RAMAN, MD;  Location: [facility];  Service: Plastics;  Laterality: Bilateral;   CAPSULOTOMY Bilateral 07/25/2021   Procedure: CAPSULOTOMY;  Surgeon: Elisabeth Craig RAMAN, MD;  Location: [facility];  Service: Plastics;  Laterality: Bilateral;   COLECTOMY  06/27/2019   COLONOSCOPY  2015   ESOPHAGOGASTRODUODENOSCOPY (EGD) WITH PROPOFOL  N/A 11/21/2022   Procedure: ESOPHAGOGASTRODUODENOSCOPY (EGD) WITH PROPOFOL ;  Surgeon: Wilhelmenia Aloha Raddle., MD;  Location: [facility];  Service: Gastroenterology;  Laterality: N/A;   FLEXIBLE SIGMOIDOSCOPY N/A 07/01/2020   Procedure: FLEXIBLE SIGMOIDOSCOPY;  Surgeon: Leigh Elspeth SQUIBB, MD;  Location: [facility];  Service: Gastroenterology;  Laterality: N/A;   FLEXIBLE SIGMOIDOSCOPY N/A 01/27/2021   Procedure: FLEXIBLE SIGMOIDOSCOPY;  Surgeon: Leigh Elspeth SQUIBB, MD;  Location: [facility];  Service: Gastroenterology;  Laterality: N/A;   FLEXIBLE SIGMOIDOSCOPY N/A 09/07/2021   Procedure: FLEXIBLE SIGMOIDOSCOPY;  Surgeon: Charlanne Groom, MD;  [facility]: [facility];  Service: Gastroenterology;  Laterality: N/A;   FLEXIBLE SIGMOIDOSCOPY N/A 11/21/2022   Procedure: FLEXIBLE SIGMOIDOSCOPY;  Surgeon: Wilhelmenia Aloha Raddle., MD;  Location: [facility];  Service: Gastroenterology;  Laterality: N/A;   IR CATHETER TUBE CHANGE  03/07/2023   IR FLUORO GUIDE CV LINE RIGHT  01/09/2023   IR US  GUIDE VASC ACCESS RIGHT  01/09/2023   LAPAROTOMY N/A 12/14/2022   Procedure: EXPLORATORY LAPAROTOMY WITH SMALL BOWEL RESECTION;  Surgeon: Polly Cordella LABOR, MD;  Location: [facility];  Service: General;   Laterality: N/A;   LAPAROTOMY N/A 12/16/2022   Procedure: REOPENING LAPAROTOMY, ILEOSTOMY;  ABDOMINAL CLOSURE;  Surgeon: Polly Cordella LABOR, MD;  Location: [facility];  Service: General;  Laterality: N/A;   NIPPLE SPARING MASTECTOMY Bilateral 11/09/2020   Procedure: BILATERAL NIPPLE SPARING MASTECTOMY;  Surgeon: Ebbie Cough, MD;  Location: [facility];  Service: General;  Laterality: Bilateral;   PORT-A-CATH REMOVAL N/A 07/25/2021   Procedure: REMOVAL PORT-A-CATH;  Surgeon: Elisabeth Craig RAMAN, MD;  Location: [facility];  Service: Plastics;  Laterality: N/A;   PORT-A-CATH REMOVAL Left 01/05/2023   Procedure: REMOVAL PORT-A-CATH;  Surgeon: Vernetta Berg, MD;  Location: [facility];  Service: General;  Laterality: Left;   PORTACATH PLACEMENT N/A 06/23/2020   Procedure: INSERTION PORT-A-CATH;  Surgeon: Ebbie Cough, MD;  Location: [facility];  Service: General;  Laterality: N/A;  START TIME OF 11:00 AM FOR 60 MINUTES [place] [facility]   PORTACATH PLACEMENT Left 05/02/2022   Procedure: INSERTION PORT-A-CATH;  Surgeon: Ebbie Cough, MD;  Location: [facility];  Service: General;  Laterality: Left;   RADIOACTIVE SEED GUIDED EXCISIONAL BREAST BIOPSY Right 05/02/2022   Procedure: RADIOACTIVE SEED GUIDED RIGHT BREAST MASS EXCISION;  Surgeon: Ebbie Cough, MD;  [facility];  Service: General;  Laterality: Right;   REMOVAL OF BILATERAL TISSUE EXPANDERS WITH PLACEMENT OF BILATERAL BREAST IMPLANTS Bilateral 07/25/2021   Procedure: REMOVAL OF BILATERAL TISSUE EXPANDERS WITH PLACEMENT OF BILATERAL BREAST IMPLANTS;  Surgeon: Elisabeth Craig RAMAN, MD;  Location: [facility];  Service: Plastics;  Laterality: Bilateral;  1.5    Allergies: Nsaids, Chlorhexidine  gluconate, Hydromorphone , and Lactose intolerance (gi)  Medications: Prior to Admission medications   Medication Sig Start Date End Date Taking? Authorizing  Provider  acetaminophen  (TYLENOL ) 500 MG tablet Take 500-1,000 mg by mouth every 6 (six) hours as needed for mild pain. Patient not taking: Reported on 02/19/2023    [provider]  albuterol  (PROVENTIL ) (2.5 MG/3ML) 0.083% nebulizer solution Take 3 mLs (2.5 mg total) by nebulization every 4 (four) hours as needed for wheezing or shortness of breath. Patient not taking: Reported on 02/19/2023 01/12/23   Cherlyn Labella, MD  ALPRAZolam  (XANAX ) 0.25 MG tablet Take 1 tablet (0.25 mg total) by mouth 2 (two) times daily. Patient not taking: Reported on 02/19/2023 01/12/23   Akula, Vijaya, MD  amoxicillin -clavulanate (AUGMENTIN ) 875-125 MG tablet Take 1 tablet by mouth 2 (two) times daily. 02/19/23   Dennise Kingsley, MD  apixaban  (ELIQUIS ) 5 MG TABS tablet Take 5 mg by mouth 2 (two) times daily. Patient not taking: Reported on 02/19/2023    [provider]  artificial tears (LACRILUBE) OINT ophthalmic ointment Place into both eyes every 4 (four) hours as needed for dry eyes. Patient not taking: Reported on 02/19/2023 01/12/23   Akula, Vijaya, MD  ascorbic acid  (VITAMIN C ) 500 MG tablet Take 1 tablet (500 mg total) by mouth 2 (two) times daily. Patient not taking: Reported on 02/19/2023 01/12/23   Cherlyn Labella, MD  cholecalciferol  (VITAMIN D3) 25 MCG (1000 UNIT) tablet Take 1,000 Units by mouth daily. Patient not taking: Reported on 02/19/2023    [provider]  diphenoxylate -atropine  (LOMOTIL ) 2.5-0.025 MG tablet Take 2 tablets by mouth 4 (four) times daily. 01/12/23   Akula, Vijaya, MD  famotidine  (PEPCID ) 40 MG/5ML suspension Take 2.5 mLs (20 mg total) by mouth daily. Patient not taking: Reported on 02/19/2023 01/13/23   Cherlyn Labella, MD  ferrous sulfate  300 (60 Fe) MG/5ML syrup Take 5 mLs (300 mg total) by mouth 2 (two) times daily. 01/16/23   Akula, Vijaya, MD  fiber (NUTRISOURCE FIBER) PACK packet Take 1 packet by mouth 3 (three) times daily. Patient not taking: Reported on  02/19/2023 01/12/23   Akula, Vijaya, MD  food thickener (SIMPLYTHICK, NECTAR/LEVEL 2/MILDLY THICK,) GEL Take 1 packet by mouth as needed. Patient not taking: Reported on 02/19/2023 01/12/23   Cherlyn Labella, MD  gabapentin  (NEURONTIN ) 250 MG/5ML solution Take 4 mLs (200 mg total) by mouth every 8 (eight) hours. Patient not taking: Reported on 02/19/2023 01/12/23   Akula, Vijaya, MD  insulin  aspart (NOVOLOG ) 100 UNIT/ML injection Inject 0-9 Units into the skin 3 (three) times daily with meals. Patient not taking: Reported on 02/11/2023 01/12/23   Akula, Vijaya, MD  lip balm (CARMEX) ointment Apply topically as needed. Patient not taking: Reported on 02/11/2023 01/12/23   Akula, Vijaya, MD  loperamide  (IMODIUM  A-D) 2 MG tablet Take 1 tablet (2 mg total) by mouth 4 (four) times daily as needed for diarrhea or loose stools. 02/12/23   Tobie Yetta HERO, MD  metoprolol  tartrate (LOPRESSOR ) 25 MG tablet Take 1 tablet (25 mg total) by mouth 2 (two) times daily. 02/12/23   Patel, Pranav M, MD  Multiple Vitamin (MULTIVITAMIN) LIQD Place 15 mLs into feeding tube daily. Patient not taking: Reported on 02/19/2023 01/17/23   Cherlyn Labella, MD  Nutritional Supplements (FEEDING SUPPLEMENT, KATE FARMS STANDARD 1.4,) LIQD liquid Take 325 mLs by  mouth 2 (two) times daily between meals. Patient not taking: Reported on 02/11/2023 01/12/23   Cherlyn Labella, MD  Nutritional Supplements (FEEDING SUPPLEMENT, KATE FARMS STANDARD 1.4,) LIQD liquid Take 360 mLs by mouth daily. Patient not taking: Reported on 02/11/2023 01/12/23   Cherlyn Labella, MD  Nutritional Supplements (FEEDING SUPPLEMENT, KATE FARMS STANDARD 1.4,) LIQD liquid Take 325 mLs by mouth 2 (two) times daily between meals. Patient not taking: Reported on 02/11/2023 01/16/23   Akula, Vijaya, MD  nystatin  (MYCOSTATIN ) 100000 UNIT/ML suspension Take 5 mLs (500,000 Units total) by mouth 4 (four) times daily. Patient not taking: Reported on 02/11/2023 01/16/23   Akula, Vijaya, MD   Opium  10 MG/ML (1%) TINC Place 1 mL (10 mg total) into feeding tube 4 (four) times daily. Patient not taking: Reported on 02/19/2023 01/16/23   Cherlyn Labella, MD  oxyCODONE  (OXY IR/ROXICODONE ) 5 MG immediate release tablet Take 1-2 tablets (5-10 mg total) by mouth every 4 (four) hours as needed for breakthrough pain. Patient not taking: Reported on 02/19/2023 01/12/23   Akula, Vijaya, MD  Vitamin D , Ergocalciferol , (DRISDOL ) 1.25 MG (50000 UNIT) CAPS capsule Take 1 capsule (50,000 Units total) by mouth every 7 (seven) days. Patient not taking: Reported on 02/19/2023 01/18/23   Cherlyn Labella, MD  Zinc  220 (50 Zn) MG CAPS Take 1 capsule by mouth daily. 02/12/23   [provider]     Family History  Problem Relation Age of Onset   Diabetes Father    Breast cancer Maternal Aunt        dx unknown age   Diabetes Paternal Aunt    Breast cancer Cousin        maternal cousin; dx unknown age   Esophageal cancer Neg Hx    Rectal cancer Neg Hx    Stomach cancer Neg Hx    Colon polyps Neg Hx    Colon cancer Neg Hx     Social History   Socioeconomic History   Marital status: Single    Spouse name: Not on file   Number of children: 1   Years of education: 13   Highest education level: Not on file  Occupational History   Occupation: Medical Coder   Tobacco Use   Smoking status: Former    Types: Cigarettes   Smokeless tobacco: Never  Vaping Use   Vaping status: Never Used  Substance and Sexual Activity   Alcohol use: Not Currently    Alcohol/week: 1.0 - 2.0 standard drink of alcohol    Types: 1 - 2 Glasses of wine per week   Drug use: No   Sexual activity: Yes    Partners: Male    Comment: Intermittent protection not consistent  Other Topics Concern   Not on file  Social History Narrative   Single, 1 daughter born 2001 approximately   Energy manager for [employer]   She is a former smoker, occasional alcohol no drug use   Social Drivers of Corporate Investment Banker  Strain: Not on file  Food Insecurity: No Food Insecurity (11/21/2022)   Hunger Vital Sign    Worried About Running Out of Food in the Last Year: Never true    Ran Out of Food in the Last Year: Never true  Transportation Needs: No Transportation Needs (11/21/2022)   PRAPARE - Administrator, Civil Service (Medical): No    Lack of Transportation (Non-Medical): No  Physical Activity: Not on file  Stress: Not on file  Social Connections: Not on  file     Vital Signs: LMP 04/24/2021   Physical Exam Constitutional:      Appearance: She is normal weight.  HENT:     Head: Normocephalic.     Mouth/Throat:     Mouth: Mucous membranes are moist.  Eyes:     General: No scleral icterus. Cardiovascular:     Rate and Rhythm: Normal rate.  Pulmonary:     Effort: Pulmonary effort is normal. No respiratory distress.  Abdominal:     General: There is no distension.     Comments: LLQ ostomy.  RLQ pigtail drain in place, to bulb suction with trace serous fluid in bulb.  Skin:    General: Skin is warm and dry.     Coloration: Skin is not jaundiced.  Neurological:     Mental Status: She is alert and oriented to person, place, and time.     Imaging: CT AP 01/10/23   CT AP 04/13/23   Drain injection 04/13/23   Labs:  CBC: Recent Labs    02/11/23 1733 02/11/23 2047 02/12/23 0535 02/12/23 1026 02/19/23 1549  WBC 10.6*  --  7.4 7.3 6.8  HGB 10.8* 9.5* 8.9* 9.2* 11.5*  HCT 32.2* 29.9* 27.1* 27.7* 36.5  PLT 361  --  298 285 342    COAGS: Recent Labs    09/28/22 0350 11/20/22 0252 12/22/22 1343 12/23/22 0520 12/24/22 0408 12/30/22 1338 01/11/23 1143 02/11/23 1733  INR 1.0   < > 1.3*   < > 1.3* 1.3* 1.2 1.1  APTT 28  --  33  --  36 38*  --   --    < > = values in this interval not displayed.    BMP: Recent Labs    01/13/23 0306 01/15/23 0313 02/11/23 1733 02/12/23 0535 02/19/23 1549  NA 131* 136 136 134* 136  K 4.2 4.1 3.7 3.4* 4.2  CL 98 103 102 100  99  CO2 24 26 23 26 24   GLUCOSE 152* 134* 96 91 81  BUN 28* 31* 25* 17 29*  CALCIUM  8.3* 8.3* 9.5 8.7* 9.7  CREATININE 0.71 0.66 0.72 0.63 0.72  GFRNONAA >60 >60 >60 >60  --     LIVER FUNCTION TESTS: Recent Labs    01/12/23 0601 01/15/23 0313 02/11/23 1733 02/12/23 0535 02/19/23 1549  BILITOT 0.4 0.3 0.4 1.0 0.5  AST 28 43* 28 27 44*  ALT 21 29 19 17  30*  ALKPHOS 113 104 107 89  --   PROT 6.7 6.4* 8.3* 6.8 7.8  ALBUMIN  2.1* 2.0* 3.5 2.7*  --     Assessment and Plan: 48 year old female with a complicated history of Crohn's disease requiring multiple surgeries. She developed a large intra-abdominal fluid collection after her last surgery in September and she received a percutaneous drain 01/11/23. At her 03/07/23 drain evaluation there was a residual collection and the drain was exchanged/upsized.  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    -switch from bulb suction to gravity bag drainage -stop drain flushing -return to drain clinic in 3-4 weeks for repeat drain injection, if fistula is present at that time, would then recommend drain downsize    Ester Sides, MD Pager: [phone]     I spent a total of 25 Minutes in face to face in clinical consultation, greater than 50% of which was counseling/coordinating care for intra-abdominal fluid collection drain.

## 2023-04-13 ENCOUNTER — Ambulatory Visit
Admission: RE | Admit: 2023-04-13 | Discharge: 2023-04-13 | Disposition: A | Discharge status: Home / Self Care | Payer: Managed Care, Other (non HMO) | Source: Ambulatory Visit | Attending: Internal Medicine | Admitting: Internal Medicine

## 2023-04-13 ENCOUNTER — Other Ambulatory Visit: Payer: Self-pay | Admitting: Internal Medicine

## 2023-04-13 ENCOUNTER — Other Ambulatory Visit: Payer: Managed Care, Other (non HMO)

## 2023-04-13 DIAGNOSIS — L02211 Cutaneous abscess of abdominal wall: Secondary | ICD-10-CM

## 2023-04-13 DIAGNOSIS — K651 Peritoneal abscess: Secondary | ICD-10-CM

## 2023-04-13 HISTORY — PX: IR RADIOLOGIST EVAL & MGMT: IMG5224

## 2023-04-13 MED ORDER — IOPAMIDOL (ISOVUE-300) INJECTION 61%
100.0000 mL | Freq: Once | INTRAVENOUS | Status: AC | PRN
  Administered 2023-04-13: 100 mL via INTRAVENOUS

## 2023-04-15 ENCOUNTER — Other Ambulatory Visit: Payer: Self-pay

## 2023-04-24 ENCOUNTER — Ambulatory Visit (HOSPITAL_COMMUNITY)
Admission: RE | Admit: 2023-04-24 | Discharge: 2023-04-24 | Disposition: A | Discharge status: Home / Self Care | Payer: Managed Care, Other (non HMO) | Source: Ambulatory Visit | Attending: Radiology | Admitting: Radiology

## 2023-04-24 ENCOUNTER — Other Ambulatory Visit (HOSPITAL_COMMUNITY): Payer: Self-pay | Admitting: Radiology

## 2023-04-24 DIAGNOSIS — L0291 Cutaneous abscess, unspecified: Secondary | ICD-10-CM

## 2023-04-24 HISTORY — PX: IR PATIENT EVAL TECH 0-60 MINS: IMG5564

## 2023-04-24 NOTE — Progress Notes (Signed)
 Patient presented to IR with pain from Stat Lock device.  Stat Lock was removed with adhesive remover, site cleaned with alcohol swab and Cinch stabilizing device was applied and redressed in a sterile fashion.  Educated patient on how to place cinch and to call IR department with any questions/ concerns she may have with the drain.

## 2023-04-25 ENCOUNTER — Encounter (HOSPITAL_COMMUNITY): Payer: Self-pay | Admitting: Radiology

## 2023-04-25 NOTE — Procedures (Signed)
  Patient presented to IR with pain from Stat Lock device.  Stat Lock was removed with adhesive remover, site cleaned with alcohol swab and Cinch stabilizing device was applied and redressed in a sterile fashion.  Educated patient on how to place cinch and to call IR department with any questions/ concerns she may have with the drain.     Jasmine Wolfe RT

## 2023-04-26 ENCOUNTER — Inpatient Hospital Stay: Payer: Managed Care, Other (non HMO) | Attending: Hematology and Oncology | Admitting: Hematology and Oncology

## 2023-04-26 VITALS — BP 106/67 | HR 89 | Temp 97.7°F | Resp 18 | Ht 64.0 in | Wt 108.8 lb

## 2023-04-26 DIAGNOSIS — C50411 Malignant neoplasm of upper-outer quadrant of right female breast: Secondary | ICD-10-CM | POA: Insufficient documentation

## 2023-04-26 DIAGNOSIS — Z170 Estrogen receptor positive status [ER+]: Secondary | ICD-10-CM | POA: Diagnosis not present

## 2023-04-26 DIAGNOSIS — Z9013 Acquired absence of bilateral breasts and nipples: Secondary | ICD-10-CM | POA: Diagnosis not present

## 2023-04-26 DIAGNOSIS — Z1722 Progesterone receptor negative status: Secondary | ICD-10-CM | POA: Insufficient documentation

## 2023-04-26 DIAGNOSIS — Z1731 Human epidermal growth factor receptor 2 positive status: Secondary | ICD-10-CM | POA: Insufficient documentation

## 2023-04-26 NOTE — Assessment & Plan Note (Signed)
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 palpable right breast mass measuring about 7 cm x 5 and half centimeters, palpable right axillary lymph node with concern for skin invasion    Treatment Plan: 1. Neoadjuvant chemo with TCHP q 3 weeks X 6 followed by HP completed 07/07/2021 2. Bilateral mastectomies: Left mastectomy: Benign Right mastectomy: Residual microinvasive cancer status post neoadjuvant therapy, DCIS, 0/12 lymph nodes, ER +1%, PR negative, HER2 equivocal 2+ by IHC 3. Adj XRT completed 02/23/2021 4. Adj Anti-estrogen therapy with tamoxifen started 06/08/2021 discontinued 06/24/2021 (ER was only 1% therefore we felt risks outweigh benefits) 5. Neratinib: Because of her bowel issues she decided not to take it. --------------------------------------------------------------------------------------------------------- Hospitalization 05/18/2021-06/13/2021 (small bowel obstruction) Right chest palpable nodule: 1.2 cm mass at 10 o'clock position biopsy revealed grade 2 IDC with high-grade DCIS ER 20% positive weak staining, PR 0%, HER2 3+ positive, Ki-67 50%   05/02/2022: Right lumpectomy: Recurrent grade 3 IDC, posterior margin focally involved by cancer April 2024: Skin right breast excision: Benign   Current treatment: Completed 5 cycles of Kadcyla Kadcyla toxicities: Diarrhea subsided Fatigue that lasted 3 days Hypokalemia: Currently on potassium supplement Chemotherapy-induced anemia: Taking iron tablet once a day today's hemoglobin is 10.  Monitoring closely. Hospitalization 09/27/2022-09/29/2022: SBO, Crohn's: Patient wants to stop her treatment because of adverse effects and how she feels.  Particular importance is a profound lack of appetite.

## 2023-04-26 NOTE — Progress Notes (Signed)
Patient Care Team: Ollen Bowl, MD as PCP - General (Internal Medicine) Maisie Fus, MD as PCP - Cardiology (Cardiology) Pershing Proud, RN as Oncology Nurse Navigator Donnelly Angelica, RN as Oncology Nurse Navigator Romie Levee, MD as Consulting Physician (Colon and Rectal Surgery) Iva Boop, MD as Consulting Physician (Gastroenterology) Emelia Loron, MD as Consulting Physician (General Surgery) Serena Croissant, MD as Consulting Physician (Hematology and Oncology)  DIAGNOSIS:  Encounter Diagnosis  Name Primary?   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 palpable right breast mass measuring about 7 cm x 5 and half centimeters, palpable right axillary lymph node with concern for skin invasion    06/08/2020 Cancer Staging   Staging form: Breast, AJCC 8th Edition - Clinical: Stage IIIB (cT4d, cN1, cM0, G3, ER+, PR-, HER2+) - Signed by Serena Croissant, MD on 07/07/2020 Histologic grading system: 3 grade system   06/24/2020 Genetic Testing   No pathogenic variants detected in [facility] [facility].  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  The report date is June 24, 2020.   The [employer]RNA[employer] panel offered by [employer] includes sequencing and rearrangement analysis for the following 47 genes:  APC, ATM, AXIN2, BARD1, BMPR1A, BRCA1, BRCA2, BRIP1, CDH1, CDK4, CDKN2A, CHEK2, DICER1, EPCAM, GREM1, HOXB13,  MEN1, MLH1, MSH2, MSH3, MSH6, MUTYH, NBN, NF1, NF2, NTHL1, PALB2, PMS2, POLD1, POLE, PTEN, RAD51C, RAD51D, RECQL, RET, SDHA, SDHAF2, SDHB, SDHC, SDHD, SMAD4, SMARCA4, STK11, TP53, TSC1, TSC2, and VHL.  RNA data is routinely analyzed for use in variant interpretation for all genes.   06/25/2020 - 10/29/2020 Chemotherapy   TCHP x6 cycles   11/09/2020 Surgery   Bilateral mastectomies: Left mastectomy: Benign Right mastectomy: Residual microinvasive cancer status post neoadjuvant therapy, DCIS, 0/12 lymph nodes, ER +1%, PR negative, HER2 equivocal 2+ by [facility]   11/19/2020 - 07/15/2021 Chemotherapy   Patient is on Treatment Plan : BREAST Trastuzumab  + Pertuzumab q21d x 13 cycles     01/11/2021 - 03/09/2021 Radiation Therapy   Site Technique Total Dose (Gy) Dose per Fx (Gy) Completed Fx Beam Energies  Chest Wall, Right: CW_Rt 3D 50.4/50.4 1.8 28/28 10X  Chest Wall, Right: CW_Rt_SCLV 3D 50.4/50.4 1.8 28/28 6X, 10X  Chest Wall, Right: CW_Rt_Bst Electron 10/10 2 5/5 6E     06/08/2021 - 06/24/2021 Anti-estrogen oral therapy   Tamoxifen (discontinued)   03/24/2022 Relapse/Recurrence   Right chest palpable nodule: 1.2 cm mass at 10 o'clock position biopsy revealed grade 2 IDC with high-grade DCIS ER 20% positive weak staining, PR 0%, HER2 3+ positive, Ki-67 50%   04/06/2022 Imaging   CT chest/abd/pelvis  IMPRESSION: 1. No evidence of metastatic disease in the chest, abdomen or pelvis. 2. Similar probable postradiation change in the peripheral anterior right upper lobe, anterior right middle lobe, lateral right lower lobe and right lung apex. 3. Surgical changes of subtotal colectomy with short segment wall thickening of the neo terminal ileum and short segments of small bowel throughout the  abdomen pelvis, with interposed gas and fluid-filled distended loops of small bowel intermixed between the areas of inflammation. Findings are compatible active inflammatory Crohn's enteritis. 4. Small volume  right lower quadrant and pelvic free fluid, likely reactive.     Electronically Signed   By: Maudry Mayhew M.D.   On: 04/06/2022 12:53     05/02/2022 Surgery   Right lumpectomy: Recurrent grade 3 IDC, posterior margin focally involved by cancer.   05/17/2022 -  Chemotherapy   Patient is on Treatment Plan : BREAST ADO-Trastuzumab Emtansine (Kadcyla) q21d       CHIEF COMPLIANT: Follow-up to discuss breast cancer surveillance  HISTORY OF PRESENT ILLNESS:   History of Present Illness   The patient, with a history of breast cancer and short bowel syndrome, presents for follow-up after a recent hospitalization for an acute pulmonary embolism (PE) and heart attack. The heart attack was precipitated by straining during bowel movements. She has been on total parenteral nutrition (TPN) for two months due to short bowel syndrome. She is currently on TPN for sixteen hours a day and also eats orally. She is scheduled to meet with a specialist for short bowel syndrome to discuss the possibility of discontinuing TPN.  The patient also has a history of breast cancer, which she perceives as less critical compared to her recent health issues. She has lost significant weight and is trying to regain it. She has a port for TPN administration, which had to be replaced with a central line. She reports no chest pain or discomfort.         ALLERGIES:  is allergic to nsaids, chlorhexidine gluconate, hydromorphone, and lactose intolerance (gi).  MEDICATIONS:  Current Outpatient Medications  Medication Sig Dispense Refill   acetaminophen (TYLENOL) 500 MG tablet Take 500-1,000 mg by mouth every 6 (six) hours as needed for mild pain. (Patient not taking: Reported on 02/19/2023)     albuterol (PROVENTIL) (2.5 MG/3ML) 0.083% nebulizer solution Take 3 mLs (2.5 mg total) by nebulization every 4 (four) hours as needed for wheezing or shortness of breath. (Patient not taking: Reported on 02/19/2023) 75 mL 12    ALPRAZolam (XANAX) 0.25 MG tablet Take 1 tablet (0.25 mg total) by mouth 2 (two) times daily. (Patient not taking: Reported on 02/19/2023)     amoxicillin-clavulanate (AUGMENTIN) 875-125 MG tablet Take 1 tablet by mouth 2 (two) times daily. 60 tablet 3   apixaban (ELIQUIS) 5 MG TABS tablet Take 5 mg by mouth 2 (two) times daily. (Patient not taking: Reported on 02/19/2023)     artificial tears (LACRILUBE) OINT ophthalmic ointment Place into both eyes every 4 (four) hours as needed for dry eyes. (Patient not taking: Reported on 02/19/2023)     ascorbic acid (VITAMIN C) 500 MG tablet Take 1 tablet (500 mg total) by mouth 2 (two) times daily. (Patient not taking: Reported on 02/19/2023)     cholecalciferol (VITAMIN D3) 25 MCG (1000 UNIT) tablet Take 1,000 Units by mouth daily. (Patient not taking: Reported on 02/19/2023)     diphenoxylate-atropine (LOMOTIL) 2.5-0.025 MG tablet Take 2 tablets by mouth 4 (four) times daily.     famotidine (PEPCID) 40 MG/5ML suspension Take 2.5 mLs (20 mg total) by mouth daily. (Patient not taking: Reported on 02/19/2023)     ferrous sulfate 300 (60 Fe) MG/5ML syrup Take 5 mLs (300 mg total) by mouth 2 (two) times daily.     fiber (NUTRISOURCE FIBER) PACK packet Take 1 packet by mouth 3 (three) times daily. (Patient  not taking: Reported on 02/19/2023)     food thickener (SIMPLYTHICK, NECTAR/LEVEL 2/MILDLY THICK,) GEL Take 1 packet by mouth as needed. (Patient not taking: Reported on 02/19/2023)     gabapentin (NEURONTIN) 250 MG/5ML solution Take 4 mLs (200 mg total) by mouth every 8 (eight) hours. (Patient not taking: Reported on 02/19/2023)     insulin aspart (NOVOLOG) 100 UNIT/ML injection Inject 0-9 Units into the skin 3 (three) times daily with meals. (Patient not taking: Reported on 02/11/2023)     lip balm (CARMEX) ointment Apply topically as needed. (Patient not taking: Reported on 02/11/2023)     loperamide (IMODIUM A-D) 2 MG tablet Take 1 tablet (2 mg total) by mouth  4 (four) times daily as needed for diarrhea or loose stools. 30 tablet 0   metoprolol tartrate (LOPRESSOR) 25 MG tablet Take 1 tablet (25 mg total) by mouth 2 (two) times daily. 60 tablet 0   Multiple Vitamin (MULTIVITAMIN) LIQD Place 15 mLs into feeding tube daily. (Patient not taking: Reported on 02/19/2023)     Nutritional Supplements (FEEDING SUPPLEMENT, KATE FARMS STANDARD 1.4,) LIQD liquid Take 325 mLs by mouth 2 (two) times daily between meals. (Patient not taking: Reported on 02/11/2023)     Nutritional Supplements (FEEDING SUPPLEMENT, KATE FARMS STANDARD 1.4,) LIQD liquid Take 360 mLs by mouth daily. (Patient not taking: Reported on 02/11/2023)     Nutritional Supplements (FEEDING SUPPLEMENT, KATE FARMS STANDARD 1.4,) LIQD liquid Take 325 mLs by mouth 2 (two) times daily between meals. (Patient not taking: Reported on 02/11/2023)     nystatin (MYCOSTATIN) 100000 UNIT/ML suspension Take 5 mLs (500,000 Units total) by mouth 4 (four) times daily. (Patient not taking: Reported on 02/11/2023) 60 mL 0   Opium 10 MG/ML (1%) TINC Place 1 mL (10 mg total) into feeding tube 4 (four) times daily. (Patient not taking: Reported on 02/19/2023)     oxyCODONE (OXY IR/ROXICODONE) 5 MG immediate release tablet Take 1-2 tablets (5-10 mg total) by mouth every 4 (four) hours as needed for breakthrough pain. (Patient not taking: Reported on 02/19/2023)     Vitamin D, Ergocalciferol, (DRISDOL) 1.25 MG (50000 UNIT) CAPS capsule Take 1 capsule (50,000 Units total) by mouth every 7 (seven) days. (Patient not taking: Reported on 02/19/2023)     Zinc 220 (50 Zn) MG CAPS Take 1 capsule by mouth daily.     No current facility-administered medications for this visit.    PHYSICAL EXAMINATION: ECOG PERFORMANCE STATUS: 1 - Symptomatic but completely ambulatory  Vitals:   04/26/23 1500  BP: 106/67  Pulse: 89  Resp: 18  Temp: 97.7 F (36.5 C)  SpO2: 100%   Filed Weights   04/26/23 1500  Weight: 108 lb 12.8 oz (49.4  kg)    Physical Exam   BREAST: No abnormalities on inspection. All other systems: Not examined SKIN: Significant weight loss, no palpable adipose tissue, skin directly overlying anatomical structures.      (exam performed in the presence of a chaperone)  LABORATORY DATA:  I have reviewed the data as listed    Latest Ref Rng & Units 02/19/2023    3:49 PM 02/12/2023    5:35 AM 02/11/2023    5:33 PM  CMP  Glucose 65 - 99 mg/dL 81  91  96   BUN 7 - 25 mg/dL 29  17  25    Creatinine 0.50 - 0.99 mg/dL 1.91  4.78  2.95   Sodium 135 - 146 mmol/L 136  134  136  Potassium 3.5 - 5.3 mmol/L 4.2  3.4  3.7   Chloride 98 - 110 mmol/L 99  100  102   CO2 20 - 32 mmol/L 24  26  23    Calcium 8.6 - 10.2 mg/dL 9.7  8.7  9.5   Total Protein 6.1 - 8.1 g/dL 7.8  6.8  8.3   Total Bilirubin 0.2 - 1.2 mg/dL 0.5  1.0  0.4   Alkaline Phos 38 - 126 U/L  89  107   AST 10 - 35 U/L 44  27  28   ALT 6 - 29 U/L 30  17  19      Lab Results  Component Value Date   WBC 6.8 02/19/2023   HGB 11.5 (L) 02/19/2023   HCT 36.5 02/19/2023   MCV 88.0 02/19/2023   PLT 342 02/19/2023   NEUTROABS 4,678 02/19/2023    ASSESSMENT & PLAN:  Malignant neoplasm of upper-outer quadrant of right breast in female, estrogen receptor positive (HCC) /24/2022, US guided biopsy of the right breast 9 0 clock mass showed grade III IDC; Prognostics showed ER 40% positive, weak staining, PR positive 0 %, Her 2 3 +, Ki 67 30% [id number] 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 palpable right breast mass measuring about 7 cm x 5 and half centimeters, palpable right axillary lymph node with concern for skin invasion    Treatment Plan: 1. Neoadjuvant chemo with TCHP q 3 weeks X 6 followed by [facility] completed 07/07/2021 2. Bilateral mastectomies: Left mastectomy: Benign Right mastectomy: Residual microinvasive cancer status post neoadjuvant therapy, DCIS, 0/12 lymph nodes, ER +1%, PR negative, HER2 equivocal 2+ by IHC 3. Adj XRT completed  02/23/2021 4. Adj Anti-estrogen therapy with tamoxifen started 06/08/2021 discontinued 06/24/2021 (ER was only 1% therefore we felt risks outweigh benefits) 5. Neratinib: Because of her bowel issues she decided not to take it. --------------------------------------------------------------------------------------------------------- Hospitalization 05/18/2021-06/13/2021 (small bowel obstruction) Right chest palpable nodule: 1.2 cm mass at 10 o'clock position biopsy revealed grade 2 IDC with high-grade DCIS ER 20% positive weak staining, PR 0%, HER2 3+ positive, Ki-67 50%   05/02/2022: Right lumpectomy: Recurrent grade 3 IDC, posterior margin focally involved by cancer April 2024: Skin right breast excision: Benign   Current treatment: Completed 5 cycles of Kadcyla completed 09/21/2022 Kadcyla toxicities: Diarrhea subsided Fatigue that lasted 3 days Hypokalemia: Currently on potassium supplement Chemotherapy-induced anemia: Taking iron tablet once a day today's hemoglobin is 10.  Monitoring closely. Hospitalization 09/27/2022-09/29/2022: [facility]BO, Crohn's: Patient wants to stop her treatment because of adverse effects and how she feels.  Particular importance is a profound lack of appetite. ------------------------------------- Assessment and Plan    Breast Cancer No current complaints or discomfort. No further tests or mammograms required due to mastectomy. -Annual follow-up for the next five years.  Short Bowel Syndrome Currently on TPN for 16 hours daily and oral nutrition. Awaiting consultation with a specialist for further management. -Continue current TPN regimen until consultation with specialist.  Weight Loss Significant weight loss noted, likely secondary to recent medical events and short bowel syndrome. -Encourage nutritional intake to regain weight.  Acute Pulmonary Embolism No current complaints. No further details provided in the conversation. -Continue current management as per standard  guidelines.  Myocardial Infarction No current complaints. No further details provided in the conversation. -Continue current management as per standard guidelines.          No orders of the defined types were placed in this encounter.  The patient has a good understanding  of the overall plan. she agrees with it. she will call with any problems that may develop before the next visit here. Total time spent: 30 mins including face to face time and time spent for planning, charting and co-ordination of care   Tamsen Meek, MD 04/26/23

## 2023-04-27 ENCOUNTER — Other Ambulatory Visit: Payer: Self-pay

## 2023-04-29 ENCOUNTER — Other Ambulatory Visit: Payer: Self-pay

## 2023-05-04 NOTE — Progress Notes (Signed)
Referring Physician(s): Dr. Hillery Hunter  Chief Complaint: The patient is seen in follow up today s/p intra-abdominal fluid collection drain placed 01/11/23 with exchange/upsize 03/07/23  History of present illness: HPI from last clinic visit 04/13/23 [name]. 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 She has been hospitalized multiple times this year for abdominal pain related to her Crohn's and she had a lengthy hospitalization 9/3-24-01/16/23 for abdominal pain. She presented to the ED 9/3 with abdominal distention, nausea and vomiting. CT showed markedly dilated distal small bowel consistent with chronic obstruction. She was admitted and the next day she was found pulseless on the floor in ED bathroom. The estimated down time was 5 minutes.    She was taken to the OR 9/5 for an exploratory laparotomy where she was noted to have an internal hernia with associated small bowel ischemia. She underwent a small bowel resection and she returned to the OR 9/7 for ostomy placement.  Subsequent imaging showed ascites and IR performed a paracentesis 9/23. A CT scan 10/2 showed a large central fluid collection and she was seen in IR 10/3 for percutaneous RLQ drain placement.   She was discharged to a long term care facility 01/16/23 and followed up with IR 03/07/23. She states she was not instructed to flush the drain and output had been minimal for several weeks. A drain injection showed a residual abscess and the drain was exchanged/upsized.    She presents today to the Interventional Radiology outpatient clinic for a follow up drain evaluation with CT imaging and possible drain injection under fluoroscopy. She last followed up with General Surgery 03/21/23.  She has had no  significant output from the drain.  The drain has remained to bulb suction.  No recent fevers, chills, or abdominal pain.  Imaging at her last clinic visit showed resolution of the fluid collection but with evidence of a rectal fistula. Her drain was switched from a bulb to a gravity bag and she was instructed to discontinue flushing the drain. She presents today for a repeat drain evaluation with contrast injection under fluoroscopy.   Past Medical History:  Diagnosis Date   Breast cancer (HCC)    right   Colon stricture (HCC) 05/07/2019   Colonic stricture (HCC) 11/19/2022   Crohn's colitis, with intestinal obstruction (HCC) 09/17/2013   Dx 2006 - right and left colon involved 2011 - pan-colitis 09/17/2013 left colitis 60-20 cm March 2021 subtotal colectomy ileosigmoid anastomosis for colonic strictures-Dr. Maisie Fus     Crohn's disease [facility])    Exacerbation of Crohn's disease of large intestine (HCC) 12/13/2022   Family history of breast cancer 06/11/2020   History of abdominal subtotal colectomy 11/19/2022   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 10/17/2010   Rectal bleeding    Uterine fibroid    Vitamin D deficiency 09/24/2013    Past Surgical History:  Procedure Laterality Date   AXILLARY LYMPH NODE DISSECTION Right 11/09/2020   Procedure: RIGHT AXILLARY LYMPH NODE DISSECTION;  Surgeon: Emelia Loron, MD;  Location: [facility];  Service: General;  Laterality: Right;   BIOPSY  07/01/2020   Procedure: BIOPSY;  Surgeon: Benancio Deeds, MD;  Location: [facility];  Service: Gastroenterology;;   BIOPSY  01/27/2021   Procedure: BIOPSY;  Surgeon: Benancio Deeds,  MD;  Location: [facility];  Service: Gastroenterology;;   BIOPSY  09/07/2021   Procedure: BIOPSY;  Surgeon: Lynann Bologna, MD;  Location: [facility];  Service: Gastroenterology;;   BIOPSY  11/21/2022   Procedure: BIOPSY;  Surgeon: Lemar Lofty., MD;   Location: [facility];  Service: Gastroenterology;;   BREAST BIOPSY Right 03/24/2022   [place] RT BREAST BX W LOC DEV 1ST LESION IMG BX SPEC US GUIDE 03/24/2022 [facility]   BREAST BIOPSY  04/28/2022   [place] RT RADIOACTIVE SEED LOC 04/28/2022 [facility]   BREAST IMPLANT REMOVAL Right 08/07/2022   Procedure: REMOVAL  OF RIGHT BREAST IMPLANTS;  Surgeon: Allena Napoleon, MD;  Location: [facility];  Service: Plastics;  Laterality: Right;   BREAST RECONSTRUCTION WITH PLACEMENT OF TISSUE EXPANDER AND FLEX HD (ACELLULAR HYDRATED DERMIS) Bilateral 11/09/2020   Procedure: BILATERAL BREAST RECONSTRUCTION WITH PLACEMENT OF TISSUE EXPANDER AND FLEX HD (ACELLULAR HYDRATED DERMIS);  Surgeon: Allena Napoleon, MD;  Location: [facility];  Service: Plastics;  Laterality: Bilateral;   CAPSULOTOMY Bilateral 07/25/2021   Procedure: CAPSULOTOMY;  Surgeon: Allena Napoleon, MD;  Location: [facility];  Service: Plastics;  Laterality: Bilateral;   COLECTOMY  06/27/2019   COLONOSCOPY  2015   ESOPHAGOGASTRODUODENOSCOPY (EGD) WITH PROPOFOL N/A 11/21/2022   Procedure: ESOPHAGOGASTRODUODENOSCOPY (EGD) WITH PROPOFOL;  Surgeon: Lemar Lofty., MD;  Location: [facility];  Service: Gastroenterology;  Laterality: N/A;   FLEXIBLE SIGMOIDOSCOPY N/A 07/01/2020   Procedure: FLEXIBLE SIGMOIDOSCOPY;  Surgeon: Benancio Deeds, MD;  Location: [facility];  Service: Gastroenterology;  Laterality: N/A;   FLEXIBLE SIGMOIDOSCOPY N/A 01/27/2021   Procedure: FLEXIBLE SIGMOIDOSCOPY;  Surgeon: Benancio Deeds, MD;  Location: [facility];  Service: Gastroenterology;  Laterality: N/A;   FLEXIBLE SIGMOIDOSCOPY N/A 09/07/2021   Procedure: FLEXIBLE SIGMOIDOSCOPY;  Surgeon: Lynann Bologna, MD;  Location: [facility];  Service: Gastroenterology;  Laterality: N/A;   FLEXIBLE SIGMOIDOSCOPY N/A 11/21/2022   Procedure: FLEXIBLE SIGMOIDOSCOPY;  Surgeon: Meridee Score Netty Starring., MD;   Location: [facility];  Service: Gastroenterology;  Laterality: N/A;   IR CATHETER TUBE CHANGE  03/07/2023   IR FLUORO GUIDE CV LINE RIGHT  01/09/2023   IR PATIENT EVAL TECH 0-60 MINS  04/24/2023   IR RADIOLOGIST EVAL & MGMT  04/13/2023   IR US GUIDE VASC ACCESS RIGHT  01/09/2023   LAPAROTOMY N/A 12/14/2022   Procedure: EXPLORATORY LAPAROTOMY WITH SMALL BOWEL RESECTION;  Surgeon: Moise Boring, MD;  Location: [facility];  Service: General;  Laterality: N/A;   LAPAROTOMY N/A 12/16/2022   Procedure: REOPENING LAPAROTOMY, ILEOSTOMY;  ABDOMINAL CLOSURE;  Surgeon: Moise Boring, MD;  Location: [facility];  Service: General;  Laterality: N/A;   NIPPLE SPARING MASTECTOMY Bilateral 11/09/2020   Procedure: BILATERAL NIPPLE SPARING MASTECTOMY;  Surgeon: Emelia Loron, MD;  Location: [facility];  Service: General;  Laterality: Bilateral;   PORT-A-CATH REMOVAL N/A 07/25/2021   Procedure: REMOVAL PORT-A-CATH;  Surgeon: Allena Napoleon, MD;  Location: [facility];  Service: Plastics;  Laterality: N/A;   PORT-A-CATH REMOVAL Left 01/05/2023   Procedure: REMOVAL PORT-A-CATH;  Surgeon: Abigail Miyamoto, MD;  Location: [facility];  Service: General;  Laterality: Left;   PORTACATH PLACEMENT N/A 06/23/2020   Procedure: INSERTION PORT-A-CATH;  Surgeon: Emelia Loron, MD;  Location: [facility];  Service: General;  Laterality: N/A;  START TIME OF 11:00 AM FOR 60 MINUTES [place] [facility]   PORTACATH PLACEMENT Left 05/02/2022   Procedure: INSERTION PORT-A-CATH;  Surgeon:  Emelia Loron, MD;  Location: [facility];  Service: General;  Laterality: Left;   RADIOACTIVE SEED GUIDED EXCISIONAL BREAST BIOPSY Right 05/02/2022   Procedure: RADIOACTIVE SEED GUIDED RIGHT BREAST MASS EXCISION;  Surgeon: Emelia Loron, MD;  Location: [facility];  Service: General;  Laterality: Right;   REMOVAL OF BILATERAL TISSUE EXPANDERS WITH PLACEMENT OF BILATERAL BREAST  IMPLANTS Bilateral 07/25/2021   Procedure: REMOVAL OF BILATERAL TISSUE EXPANDERS WITH PLACEMENT OF BILATERAL BREAST IMPLANTS;  Surgeon: Allena Napoleon, MD;  Location: [facility];  Service: Plastics;  Laterality: Bilateral;  1.5    Allergies: Nsaids, Chlorhexidine gluconate, Hydromorphone, and Lactose intolerance (gi)  Medications: Prior to Admission medications   Medication Sig Start Date End Date Taking? Authorizing Provider  acetaminophen (TYLENOL) 500 MG tablet Take 500-1,000 mg by mouth every 6 (six) hours as needed for mild pain. Patient not taking: Reported on 02/19/2023    [provider]  albuterol (PROVENTIL) (2.5 MG/3ML) 0.083% nebulizer solution Take 3 mLs (2.5 mg total) by nebulization every 4 (four) hours as needed for wheezing or shortness of breath. Patient not taking: Reported on 02/19/2023 01/12/23   Kathlen Mody, MD  ALPRAZolam Prudy Feeler) 0.25 MG tablet Take 1 tablet (0.25 mg total) by mouth 2 (two) times daily. Patient not taking: Reported on 02/19/2023 01/12/23   Kathlen Mody, MD  amoxicillin-clavulanate (AUGMENTIN) 875-125 MG tablet Take 1 tablet by mouth 2 (two) times daily. 02/19/23   Danelle Earthly, MD  apixaban (ELIQUIS) 5 MG TABS tablet Take 5 mg by mouth 2 (two) times daily. Patient not taking: Reported on 02/19/2023    [provider]  artificial tears (LACRILUBE) OINT ophthalmic ointment Place into both eyes every 4 (four) hours as needed for dry eyes. Patient not taking: Reported on 02/19/2023 01/12/23   Kathlen Mody, MD  ascorbic acid (VITAMIN C) 500 MG tablet Take 1 tablet (500 mg total) by mouth 2 (two) times daily. Patient not taking: Reported on 02/19/2023 01/12/23   Kathlen Mody, MD  cholecalciferol (VITAMIN D3) 25 MCG (1000 UNIT) tablet Take 1,000 Units by mouth daily. Patient not taking: Reported on 02/19/2023    [provider]  diphenoxylate-atropine (LOMOTIL) 2.5-0.025 MG tablet Take 2 tablets by mouth 4 (four)  times daily. 01/12/23   Kathlen Mody, MD  famotidine (PEPCID) 40 MG/5ML suspension Take 2.5 mLs (20 mg total) by mouth daily. Patient not taking: Reported on 02/19/2023 01/13/23   Kathlen Mody, MD  ferrous sulfate 300 (60 Fe) MG/5ML syrup Take 5 mLs (300 mg total) by mouth 2 (two) times daily. 01/16/23   Kathlen Mody, MD  fiber (NUTRISOURCE FIBER) PACK packet Take 1 packet by mouth 3 (three) times daily. Patient not taking: Reported on 02/19/2023 01/12/23   Kathlen Mody, MD  food thickener (SIMPLYTHICK, NECTAR/LEVEL 2/MILDLY THICK,) GEL Take 1 packet by mouth as needed. Patient not taking: Reported on 02/19/2023 01/12/23   Kathlen Mody, MD  gabapentin (NEURONTIN) 250 MG/5ML solution Take 4 mLs (200 mg total) by mouth every 8 (eight) hours. Patient not taking: Reported on 02/19/2023 01/12/23   Kathlen Mody, MD  insulin aspart (NOVOLOG) 100 UNIT/ML injection Inject 0-9 Units into the skin 3 (three) times daily with meals. Patient not taking: Reported on 02/11/2023 01/12/23   Kathlen Mody, MD  lip balm (CARMEX) ointment Apply topically as needed. Patient not taking: Reported on 02/11/2023 01/12/23   Kathlen Mody, MD  loperamide (IMODIUM A-D) 2 MG tablet Take 1 tablet (2 mg total) by mouth 4 (  four) times daily as needed for diarrhea or loose stools. 02/12/23   Rolly Salter, MD  metoprolol tartrate (LOPRESSOR) 25 MG tablet Take 1 tablet (25 mg total) by mouth 2 (two) times daily. 02/12/23   Rolly Salter, MD  Multiple Vitamin (MULTIVITAMIN) LIQD Place 15 mLs into feeding tube daily. Patient not taking: Reported on 02/19/2023 01/17/23   Kathlen Mody, MD  Nutritional Supplements (FEEDING SUPPLEMENT, KATE FARMS STANDARD 1.4,) LIQD liquid Take 325 mLs by mouth 2 (two) times daily between meals. Patient not taking: Reported on 02/11/2023 01/12/23   Kathlen Mody, MD  Nutritional Supplements (FEEDING SUPPLEMENT, KATE FARMS STANDARD 1.4,) LIQD liquid Take 360 mLs by mouth daily. Patient not taking: Reported  on 02/11/2023 01/12/23   Kathlen Mody, MD  Nutritional Supplements (FEEDING SUPPLEMENT, KATE FARMS STANDARD 1.4,) LIQD liquid Take 325 mLs by mouth 2 (two) times daily between meals. Patient not taking: Reported on 02/11/2023 01/16/23   Kathlen Mody, MD  nystatin (MYCOSTATIN) 100000 UNIT/ML suspension Take 5 mLs (500,000 Units total) by mouth 4 (four) times daily. Patient not taking: Reported on 02/11/2023 01/16/23   Kathlen Mody, MD  Opium 10 MG/ML (1%) TINC Place 1 mL (10 mg total) into feeding tube 4 (four) times daily. Patient not taking: Reported on 02/19/2023 01/16/23   Kathlen Mody, MD  oxyCODONE (OXY IR/ROXICODONE) 5 MG immediate release tablet Take 1-2 tablets (5-10 mg total) by mouth every 4 (four) hours as needed for breakthrough pain. Patient not taking: Reported on 02/19/2023 01/12/23   Kathlen Mody, MD  Vitamin D, Ergocalciferol, (DRISDOL) 1.25 MG (50000 UNIT) CAPS capsule Take 1 capsule (50,000 Units total) by mouth every 7 (seven) days. Patient not taking: Reported on 02/19/2023 01/18/23   Kathlen Mody, MD  Zinc 220 (50 Zn) MG CAPS Take 1 capsule by mouth daily. 02/12/23   [provider]     Family History  Problem Relation Age of Onset   Diabetes Father    Breast cancer Maternal Aunt        dx unknown age   Diabetes Paternal Aunt    Breast cancer Cousin        maternal cousin; dx unknown age   Esophageal cancer Neg Hx    Rectal cancer Neg Hx    Stomach cancer Neg Hx    Colon polyps Neg Hx    Colon cancer Neg Hx     Social History   Socioeconomic History   Marital status: Single    Spouse name: Not on file   Number of children: 1   Years of education: 13   Highest education level: Not on file  Occupational History   Occupation: Medical Coder   Tobacco Use   Smoking status: Former    Types: Cigarettes   Smokeless tobacco: Never  Vaping Use   Vaping status: Never Used  Substance and Sexual Activity   Alcohol use: Not Currently    Alcohol/week: 1.0 -  2.0 standard drink of alcohol    Types: 1 - 2 Glasses of wine per week   Drug use: No   Sexual activity: Yes    Partners: Male    Comment: Intermittent protection not consistent  Other Topics Concern   Not on file  Social History Narrative   Single, 1 daughter born 2001 approximately   Energy manager for [employer]   She is a former smoker, occasional alcohol no drug use   Social Drivers of Corporate investment banker Strain: Not on [employer]  Insecurity: No Food Insecurity (11/21/2022)   Hunger Vital Sign    Worried About Running Out of Food in the Last Year: Never true    Ran Out of Food in the Last Year: Never true  Transportation Needs: No Transportation Needs (11/21/2022)   PRAPARE - Administrator, Civil Service (Medical): No    Lack of Transportation (Non-Medical): No  Physical Activity: Not on file  Stress: Not on file  Social Connections: Not on file     Vital Signs: LMP 04/24/2021   Physical Exam  Imaging: CT AP 01/10/23    CT AP 04/13/23    Drain injection 04/13/23     Labs:  CBC: Recent Labs    02/11/23 1733 02/11/23 2047 02/12/23 0535 02/12/23 1026 02/19/23 1549  WBC 10.6*  --  7.4 7.3 6.8  HGB 10.8* 9.5* 8.9* 9.2* 11.5*  HCT 32.2* 29.9* 27.1* 27.7* 36.5  PLT 361  --  298 285 342    COAGS: Recent Labs    09/28/22 0350 11/20/22 0252 12/22/22 1343 12/23/22 0520 12/24/22 0408 12/30/22 1338 01/11/23 1143 02/11/23 1733  INR 1.0   < > 1.3*   < > 1.3* 1.3* 1.2 1.1  APTT 28  --  33  --  36 38*  --   --    < > = values in this interval not displayed.    BMP: Recent Labs    01/13/23 0306 01/15/23 0313 02/11/23 1733 02/12/23 0535 02/19/23 1549  NA 131* 136 136 134* 136  K 4.2 4.1 3.7 3.4* 4.2  CL 98 103 102 100 99  CO2 24 26 23 26 24   GLUCOSE 152* 134* 96 91 81  BUN 28* 31* 25* 17 29*  CALCIUM 8.3* 8.3* 9.5 8.7* 9.7  CREATININE 0.71 0.66 0.72 0.63 0.72  GFRNONAA >60 >60 >60 >60  --     LIVER FUNCTION TESTS: Recent  Labs    01/12/23 0601 01/15/23 0313 02/11/23 1733 02/12/23 0535 02/19/23 1549  BILITOT 0.4 0.3 0.4 1.0 0.5  AST 28 43* 28 27 44*  ALT 21 29 19 17  30*  ALKPHOS 113 104 107 89  --   PROT 6.7 6.4* 8.3* 6.8 7.8  ALBUMIN 2.1* 2.0* 3.5 2.7*  --     Assessment and Plan: 48 year old female with a complicated history of Crohn's disease requiring multiple surgeries. She developed a large intra-abdominal fluid collection after her last surgery in September and she received a percutaneous drain 01/11/23. At her 03/07/23 drain evaluation there was a residual collection and the drain was exchanged/upsized. 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 The drain was switched from bulb suction to gravity drainage and she was instructed to stop flushing the drain.   Electronically Signed: Mickie Kay 05/04/2023, 12:22 PM   I spent a total of 25 Minutes in face to face in clinical consultation, greater than 50% of which was counseling/coordinating care for intra-abdominal fluid collection drain.

## 2023-05-07 ENCOUNTER — Telehealth: Payer: Self-pay | Admitting: Internal Medicine

## 2023-05-07 ENCOUNTER — Ambulatory Visit
Admission: RE | Admit: 2023-05-07 | Discharge: 2023-05-07 | Disposition: A | Discharge status: Home / Self Care | Payer: Managed Care, Other (non HMO) | Source: Ambulatory Visit | Attending: Internal Medicine

## 2023-05-07 ENCOUNTER — Ambulatory Visit
Admission: RE | Admit: 2023-05-07 | Discharge: 2023-05-07 | Disposition: A | Discharge status: Home / Self Care | Payer: Managed Care, Other (non HMO) | Source: Ambulatory Visit | Attending: Internal Medicine | Admitting: Internal Medicine

## 2023-05-07 DIAGNOSIS — L02211 Cutaneous abscess of abdominal wall: Secondary | ICD-10-CM

## 2023-05-07 HISTORY — PX: IR RADIOLOGIST EVAL & MGMT: IMG5224

## 2023-05-07 NOTE — Telephone Encounter (Signed)
Pt missed last appt on 12/16. Drain out, can stop abx

## 2023-05-07 NOTE — Telephone Encounter (Signed)
Left voicemail asking patient to return my call. Will also try sending a my chart message.   Yelina Sarratt Lesli Albee, CMA

## 2023-05-09 ENCOUNTER — Inpatient Hospital Stay (HOSPITAL_COMMUNITY)
Admission: EM | Admit: 2023-05-09 | Discharge: 2023-05-17 | DRG: 193 | Disposition: A | Discharge status: Home Health | Payer: Managed Care, Other (non HMO) | Attending: Internal Medicine | Admitting: Internal Medicine

## 2023-05-09 ENCOUNTER — Emergency Department (HOSPITAL_COMMUNITY): Payer: Managed Care, Other (non HMO)

## 2023-05-09 ENCOUNTER — Encounter (HOSPITAL_COMMUNITY): Payer: Self-pay

## 2023-05-09 ENCOUNTER — Other Ambulatory Visit: Payer: Self-pay

## 2023-05-09 DIAGNOSIS — R634 Abnormal weight loss: Secondary | ICD-10-CM

## 2023-05-09 DIAGNOSIS — R578 Other shock: Secondary | ICD-10-CM | POA: Diagnosis present

## 2023-05-09 DIAGNOSIS — E2740 Unspecified adrenocortical insufficiency: Secondary | ICD-10-CM | POA: Diagnosis present

## 2023-05-09 DIAGNOSIS — Z888 Allergy status to other drugs, medicaments and biological substances status: Secondary | ICD-10-CM

## 2023-05-09 DIAGNOSIS — Z9013 Acquired absence of bilateral breasts and nipples: Secondary | ICD-10-CM

## 2023-05-09 DIAGNOSIS — R7881 Bacteremia: Secondary | ICD-10-CM

## 2023-05-09 DIAGNOSIS — Z853 Personal history of malignant neoplasm of breast: Secondary | ICD-10-CM

## 2023-05-09 DIAGNOSIS — Z789 Other specified health status: Secondary | ICD-10-CM | POA: Diagnosis not present

## 2023-05-09 DIAGNOSIS — I959 Hypotension, unspecified: Principal | ICD-10-CM

## 2023-05-09 DIAGNOSIS — J101 Influenza due to other identified influenza virus with other respiratory manifestations: Principal | ICD-10-CM

## 2023-05-09 DIAGNOSIS — E872 Acidosis, unspecified: Secondary | ICD-10-CM | POA: Diagnosis present

## 2023-05-09 DIAGNOSIS — R64 Cachexia: Secondary | ICD-10-CM | POA: Diagnosis present

## 2023-05-09 DIAGNOSIS — L02211 Cutaneous abscess of abdominal wall: Secondary | ICD-10-CM | POA: Diagnosis present

## 2023-05-09 DIAGNOSIS — Z881 Allergy status to other antibiotic agents status: Secondary | ICD-10-CM | POA: Diagnosis not present

## 2023-05-09 DIAGNOSIS — A419 Sepsis, unspecified organism: Secondary | ICD-10-CM | POA: Diagnosis present

## 2023-05-09 DIAGNOSIS — K651 Peritoneal abscess: Secondary | ICD-10-CM | POA: Diagnosis present

## 2023-05-09 DIAGNOSIS — K50918 Crohn's disease, unspecified, with other complication: Secondary | ICD-10-CM | POA: Diagnosis not present

## 2023-05-09 DIAGNOSIS — D638 Anemia in other chronic diseases classified elsewhere: Secondary | ICD-10-CM | POA: Diagnosis present

## 2023-05-09 DIAGNOSIS — Z95828 Presence of other vascular implants and grafts: Secondary | ICD-10-CM | POA: Diagnosis not present

## 2023-05-09 DIAGNOSIS — R6521 Severe sepsis with septic shock: Secondary | ICD-10-CM | POA: Diagnosis not present

## 2023-05-09 DIAGNOSIS — Z7901 Long term (current) use of anticoagulants: Secondary | ICD-10-CM | POA: Diagnosis not present

## 2023-05-09 DIAGNOSIS — Z7952 Long term (current) use of systemic steroids: Secondary | ICD-10-CM

## 2023-05-09 DIAGNOSIS — Z833 Family history of diabetes mellitus: Secondary | ICD-10-CM

## 2023-05-09 DIAGNOSIS — Z1152 Encounter for screening for COVID-19: Secondary | ICD-10-CM

## 2023-05-09 DIAGNOSIS — D72829 Elevated white blood cell count, unspecified: Secondary | ICD-10-CM | POA: Diagnosis not present

## 2023-05-09 DIAGNOSIS — K50112 Crohn's disease of large intestine with intestinal obstruction: Secondary | ICD-10-CM | POA: Diagnosis present

## 2023-05-09 DIAGNOSIS — E861 Hypovolemia: Secondary | ICD-10-CM | POA: Diagnosis present

## 2023-05-09 DIAGNOSIS — E876 Hypokalemia: Secondary | ICD-10-CM | POA: Diagnosis not present

## 2023-05-09 DIAGNOSIS — D259 Leiomyoma of uterus, unspecified: Secondary | ICD-10-CM | POA: Diagnosis present

## 2023-05-09 DIAGNOSIS — N179 Acute kidney failure, unspecified: Secondary | ICD-10-CM | POA: Diagnosis present

## 2023-05-09 DIAGNOSIS — Z87898 Personal history of other specified conditions: Secondary | ICD-10-CM

## 2023-05-09 DIAGNOSIS — Z9049 Acquired absence of other specified parts of digestive tract: Secondary | ICD-10-CM

## 2023-05-09 DIAGNOSIS — Z886 Allergy status to analgesic agent status: Secondary | ICD-10-CM

## 2023-05-09 DIAGNOSIS — E440 Moderate protein-calorie malnutrition: Secondary | ICD-10-CM | POA: Diagnosis present

## 2023-05-09 DIAGNOSIS — Z79899 Other long term (current) drug therapy: Secondary | ICD-10-CM

## 2023-05-09 DIAGNOSIS — R579 Shock, unspecified: Secondary | ICD-10-CM | POA: Diagnosis not present

## 2023-05-09 DIAGNOSIS — Z87891 Personal history of nicotine dependence: Secondary | ICD-10-CM

## 2023-05-09 DIAGNOSIS — K5090 Crohn's disease, unspecified, without complications: Secondary | ICD-10-CM

## 2023-05-09 DIAGNOSIS — Z681 Body mass index (BMI) 19 or less, adult: Secondary | ICD-10-CM

## 2023-05-09 DIAGNOSIS — Z803 Family history of malignant neoplasm of breast: Secondary | ICD-10-CM

## 2023-05-09 DIAGNOSIS — Z86711 Personal history of pulmonary embolism: Secondary | ICD-10-CM

## 2023-05-09 DIAGNOSIS — Z933 Colostomy status: Secondary | ICD-10-CM

## 2023-05-09 DIAGNOSIS — K50919 Crohn's disease, unspecified, with unspecified complications: Secondary | ICD-10-CM | POA: Diagnosis not present

## 2023-05-09 DIAGNOSIS — R54 Age-related physical debility: Secondary | ICD-10-CM | POA: Diagnosis present

## 2023-05-09 DIAGNOSIS — D509 Iron deficiency anemia, unspecified: Secondary | ICD-10-CM | POA: Diagnosis present

## 2023-05-09 DIAGNOSIS — I9589 Other hypotension: Secondary | ICD-10-CM | POA: Diagnosis present

## 2023-05-09 DIAGNOSIS — E739 Lactose intolerance, unspecified: Secondary | ICD-10-CM | POA: Diagnosis present

## 2023-05-09 DIAGNOSIS — E871 Hypo-osmolality and hyponatremia: Secondary | ICD-10-CM | POA: Diagnosis present

## 2023-05-09 DIAGNOSIS — Z9882 Breast implant status: Secondary | ICD-10-CM

## 2023-05-09 LAB — COMPREHENSIVE METABOLIC PANEL
ALT: 111 U/L — ABNORMAL HIGH (ref 0–44)
AST: 88 U/L — ABNORMAL HIGH (ref 15–41)
Albumin: 3.5 g/dL (ref 3.5–5.0)
Alkaline Phosphatase: 294 U/L — ABNORMAL HIGH (ref 38–126)
Anion gap: 15 (ref 5–15)
BUN: 34 mg/dL — ABNORMAL HIGH (ref 6–20)
CO2: 20 mmol/L — ABNORMAL LOW (ref 22–32)
Calcium: 9.1 mg/dL (ref 8.9–10.3)
Chloride: 96 mmol/L — ABNORMAL LOW (ref 98–111)
Creatinine, Ser: 1.4 mg/dL — ABNORMAL HIGH (ref 0.44–1.00)
GFR, Estimated: 47 mL/min — ABNORMAL LOW (ref >60)
Glucose, Bld: 97 mg/dL (ref 70–99)
Potassium: 3.6 mmol/L (ref 3.5–5.1)
Sodium: 131 mmol/L — ABNORMAL LOW (ref 135–145)
Total Bilirubin: 1.4 mg/dL — ABNORMAL HIGH (ref 0.0–1.2)
Total Protein: 8.5 g/dL — ABNORMAL HIGH (ref 6.5–8.1)

## 2023-05-09 LAB — CBC WITH DIFFERENTIAL/PLATELET
Abs Immature Granulocytes: 0.13 10*3/uL — ABNORMAL HIGH (ref 0.00–0.07)
Basophils Absolute: 0 10*3/uL (ref 0.0–0.1)
Basophils Relative: 0 %
Eosinophils Absolute: 0 10*3/uL (ref 0.0–0.5)
Eosinophils Relative: 0 %
HCT: 33.8 % — ABNORMAL LOW (ref 36.0–46.0)
Hemoglobin: 10.8 g/dL — ABNORMAL LOW (ref 12.0–15.0)
Immature Granulocytes: 1 %
Lymphocytes Relative: 3 %
Lymphs Abs: 0.5 10*3/uL — ABNORMAL LOW (ref 0.7–4.0)
MCH: 27.1 pg (ref 26.0–34.0)
MCHC: 32 g/dL (ref 30.0–36.0)
MCV: 84.9 fL (ref 80.0–100.0)
Monocytes Absolute: 0.3 10*3/uL (ref 0.1–1.0)
Monocytes Relative: 2 %
Neutro Abs: 18.3 10*3/uL — ABNORMAL HIGH (ref 1.7–7.7)
Neutrophils Relative %: 94 %
Platelets: 255 10*3/uL (ref 150–400)
RBC: 3.98 MIL/uL (ref 3.87–5.11)
RDW: 13.7 % (ref 11.5–15.5)
WBC: 19.4 10*3/uL — ABNORMAL HIGH (ref 4.0–10.5)
nRBC: 0 % (ref 0.0–0.2)

## 2023-05-09 LAB — APTT: aPTT: 40 s — ABNORMAL HIGH (ref 24–36)

## 2023-05-09 LAB — I-STAT CG4 LACTIC ACID, ED: Lactic Acid, Venous: 1 mmol/L (ref 0.5–1.9)

## 2023-05-09 LAB — URINALYSIS, W/ REFLEX TO CULTURE (INFECTION SUSPECTED)
Bacteria, UA: NONE SEEN
Bilirubin Urine: NEGATIVE
Glucose, UA: NEGATIVE mg/dL
Hgb urine dipstick: NEGATIVE
Ketones, ur: NEGATIVE mg/dL
Leukocytes,Ua: NEGATIVE
Nitrite: NEGATIVE
Protein, ur: NEGATIVE mg/dL
Specific Gravity, Urine: 1.013 (ref 1.005–1.030)
pH: 6 (ref 5.0–8.0)

## 2023-05-09 LAB — PROTIME-INR
INR: 1.2 (ref 0.8–1.2)
Prothrombin Time: 15.6 s — ABNORMAL HIGH (ref 11.4–15.2)

## 2023-05-09 MED ORDER — DOCUSATE SODIUM 100 MG PO CAPS: 100.0000 mg | ORAL_CAPSULE | Freq: Two times a day (BID) | ORAL | Status: DC | PRN

## 2023-05-09 MED ORDER — FLUCONAZOLE IN SODIUM CHLORIDE 200-0.9 MG/100ML-% IV SOLN: 200.0000 mg | INTRAVENOUS | Status: DC

## 2023-05-09 MED ORDER — PIPERACILLIN-TAZOBACTAM 3.375 G IVPB
3.3750 g | Freq: Three times a day (TID) | INTRAVENOUS | Status: DC
  Filled 2023-05-09: qty 50

## 2023-05-09 MED ORDER — SODIUM CHLORIDE 0.9 % IV SOLN
2.0000 g | Freq: Once | INTRAVENOUS | Status: DC
  Filled 2023-05-09: qty 12.5

## 2023-05-09 MED ORDER — LACTATED RINGERS IV BOLUS
1000.0000 mL | Freq: Once | INTRAVENOUS | Status: AC
  Administered 2023-05-09: 1000 mL via INTRAVENOUS

## 2023-05-09 MED ORDER — SODIUM CHLORIDE 0.9% FLUSH
10.0000 mL | Freq: Two times a day (BID) | INTRAVENOUS | Status: DC
  Administered 2023-05-09: 10 mL
  Administered 2023-05-10: 30 mL
  Administered 2023-05-10 – 2023-05-11 (×2): 20 mL
  Administered 2023-05-11 – 2023-05-12 (×2): 10 mL
  Administered 2023-05-12: 20 mL
  Administered 2023-05-13 – 2023-05-14 (×4): 10 mL
  Administered 2023-05-15: 40 mL
  Administered 2023-05-15: 20 mL
  Administered 2023-05-16: 40 mL

## 2023-05-09 MED ORDER — ALBUMIN HUMAN 25 % IV SOLN
25.0000 g | Freq: Once | INTRAVENOUS | Status: AC
  Administered 2023-05-09: 25 g via INTRAVENOUS
  Filled 2023-05-09: qty 100

## 2023-05-09 MED ORDER — IOHEXOL 300 MG/ML  SOLN
100.0000 mL | Freq: Once | INTRAMUSCULAR | Status: AC | PRN
  Administered 2023-05-09: 100 mL via INTRAVENOUS

## 2023-05-09 MED ORDER — SODIUM CHLORIDE 0.9% FLUSH
10.0000 mL | INTRAVENOUS | Status: DC | PRN
  Administered 2023-05-16 (×2): 10 mL

## 2023-05-09 MED ORDER — NOREPINEPHRINE 4 MG/250ML-% IV SOLN
0.0000 ug/min | INTRAVENOUS | Status: DC
  Administered 2023-05-09: 2 ug/min via INTRAVENOUS
  Filled 2023-05-09: qty 250

## 2023-05-09 MED ORDER — POLYETHYLENE GLYCOL 3350 17 G PO PACK: 17.0000 g | PACK | Freq: Every day | ORAL | Status: DC | PRN

## 2023-05-09 MED ORDER — VANCOMYCIN HCL IN DEXTROSE 1-5 GM/200ML-% IV SOLN
1000.0000 mg | Freq: Once | INTRAVENOUS | Status: AC
  Administered 2023-05-09: 1000 mg via INTRAVENOUS
  Filled 2023-05-09 (×2): qty 200

## 2023-05-09 MED ORDER — ONDANSETRON HCL 4 MG/2ML IJ SOLN: 4.0000 mg | Freq: Four times a day (QID) | INTRAMUSCULAR | Status: DC | PRN

## 2023-05-09 MED ORDER — ENOXAPARIN SODIUM 30 MG/0.3ML IJ SOSY
30.0000 mg | PREFILLED_SYRINGE | Freq: Two times a day (BID) | INTRAMUSCULAR | Status: DC
  Filled 2023-05-09: qty 0.3

## 2023-05-09 MED ORDER — SODIUM CHLORIDE 0.9 % IV SOLN
2.0000 g | Freq: Once | INTRAVENOUS | Status: DC
  Administered 2023-05-09: 2 g via INTRAVENOUS
  Filled 2023-05-09: qty 12.5

## 2023-05-09 MED ORDER — FLUCONAZOLE IN SODIUM CHLORIDE 400-0.9 MG/200ML-% IV SOLN
400.0000 mg | Freq: Once | INTRAVENOUS | Status: AC
  Administered 2023-05-10: 400 mg via INTRAVENOUS
  Filled 2023-05-09: qty 200

## 2023-05-09 NOTE — ED Notes (Signed)
Pt states that she does not want to start any abx until she has her CT Scan completed and read. Abx remains on hold at this time.

## 2023-05-09 NOTE — Progress Notes (Signed)
Pharmacy Antibiotic Note  [name] is a 48 y.o. female admitted on 05/09/2023 with sepsis with suspected enteritis with small bowel inflammation.  PMH significant for breast cancer, Crohn's disease.  Pharmacy has been consulted for Zosyn and Diflucan dosing.  Plan: Zosyn 3.375g IV q8h (4 hour infusion). Diflucan 400mg  IV x 1 followed by 200mg  IV q24h Follow renal function F/u culture results and sensitivities  Height: 5\' 4"  (162.6 cm) Weight: 49 kg (108 lb 0.4 oz) IBW/kg (Calculated) : 54.7  Temp (24hrs), Avg:98.2 F (36.8 C), Min:98.1 F (36.7 C), Max:98.3 F (36.8 C)  Recent Labs  Lab 05/09/23 1625 05/09/23 1635  WBC 19.4*  --   CREATININE 1.40*  --   LATICACIDVEN  --  1.0    Estimated Creatinine Clearance: 39.3 mL/min (A) (by C-G formula based on SCr of 1.4 mg/dL (H)).    Allergies  Allergen Reactions   Nsaids Other (See Comments)    Crohns disease/ IBD   Chlorhexidine Gluconate Itching    Able to use CHG swabs for ports, reaction with direct skin contact   Hydromorphone Other (See Comments)    Confusion/sedation   Lactose Other (See Comments)    Upset stomach/diarrhea   Lactose Intolerance (Gi) Other (See Comments)    Upset stomach/diarrhea    Antimicrobials this admission: 1/29 Vancomycin x 1 1/29 Cefepime x 1 1/29 Zosyn >> 1/29 Diflucan >>  Dose adjustments this admission:    Microbiology results: 1/29 BCx:   1/29 Fungus Cx:     Thank you for allowing pharmacy to be a part of this patient's care.  Maryellen Pile, PharmD 05/09/2023 10:49 PM

## 2023-05-09 NOTE — ED Notes (Signed)
ED TO INPATIENT HANDOFF REPORT  ED Nurse Name and Phone #: Anwitha Mapes/[phone]  S Name/Age/Gender [name] 48 y.o. female Room/Bed: WA19/WA19  Code Status   Code Status: Full Code  Home/SNF/Other Home Patient oriented to: self, place, time, and situation Is this baseline? Yes   Triage Complete: Triage complete  Chief Complaint Septic shock (HCC) [A41.9, R65.21]  Triage Note C/o weakness, chills, and n/v that started today.     Allergies Allergies  Allergen Reactions   Nsaids Other (See Comments)    Crohns disease/ IBD   Chlorhexidine Gluconate Itching    Able to use CHG swabs for ports, reaction with direct skin contact   Hydromorphone Other (See Comments)    Confusion/sedation   Lactose Other (See Comments)    Upset stomach/diarrhea   Lactose Intolerance (Gi) Other (See Comments)    Upset stomach/diarrhea    Level of Care/Admitting Diagnosis ED Disposition     ED Disposition  Admit   Condition  --   Comment  Hospital Area: [facility] [[id number]]  Level of Care: ICU [6]  May admit patient to [facility] or [facility] if equivalent level of care is available:: Yes  Covid Evaluation: Asymptomatic - no recent exposure (last 10 days) testing not required  Diagnosis: Septic shock [facility]) [[id number]]  Admitting Physician: Briant Sites [[id number]]  Attending Physician: Briant Sites [phone]  Certification:: I certify this patient will need inpatient services for at least 2 midnights  Expected Medical Readiness: 05/14/2023          B Medical/Surgery History Past Medical History:  Diagnosis Date   Breast cancer (HCC)    right   Colon stricture (HCC) 05/07/2019   Colonic stricture (HCC) 11/19/2022   Crohn's colitis, with intestinal obstruction (HCC) 09/17/2013   Dx 2006 - right and left colon involved 2011 - pan-colitis 09/17/2013 left colitis 60-20 cm March 2021 subtotal colectomy ileosigmoid anastomosis for colonic  strictures-Dr. Maisie Fus     Crohn's disease [facility])    Exacerbation of Crohn's disease of large intestine (HCC) 12/13/2022   Family history of breast cancer 06/11/2020   History of abdominal subtotal colectomy 11/19/2022   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 10/17/2010   Rectal bleeding    Uterine fibroid    Vitamin D deficiency 09/24/2013   Past Surgical History:  Procedure Laterality Date   AXILLARY LYMPH NODE DISSECTION Right 11/09/2020   Procedure: RIGHT AXILLARY LYMPH NODE DISSECTION;  Surgeon: Emelia Loron, MD;  Location: [facility];  Service: General;  Laterality: Right;   BIOPSY  07/01/2020   Procedure: BIOPSY;  Surgeon: Benancio Deeds, MD;  Location: [facility];  Service: Gastroenterology;;   BIOPSY  01/27/2021   Procedure: BIOPSY;  Surgeon: Benancio Deeds, MD;  Location: [facility];  Service: Gastroenterology;;   BIOPSY  09/07/2021   Procedure: BIOPSY;  Surgeon: Lynann Bologna, MD;  Location: [facility];  Service: Gastroenterology;;   BIOPSY  11/21/2022   Procedure: BIOPSY;  Surgeon: Lemar Lofty., MD;  Location[facility];  Service: Gastroenterology;;   BREAST BIOPSY Right 03/24/2022   [place] RT BREAST BX W LOC DEV 1ST LESION IMG BX SPEC US GUIDE 03/24/2022 [facility]   BREAST BIOPSY  04/28/2022   [place] RT RADIOACTIVE SEED LOC 04/28/2022 [facility]   BREAST IMPLANT REMOVAL Right 08/07/2022   Procedure: REMOVAL  OF RIGHT BREAST IMPLANTS;  Surgeon: Allena Napoleon, MD;  Location: [facility];  Service: Plastics;  Laterality: Right;   BREAST RECONSTRUCTION WITH PLACEMENT OF TISSUE EXPANDER AND FLEX HD (ACELLULAR HYDRATED DERMIS) Bilateral 11/09/2020   Procedure: BILATERAL BREAST RECONSTRUCTION WITH PLACEMENT OF TISSUE EXPANDER AND FLEX HD (ACELLULAR HYDRATED DERMIS);  Surgeon: Allena Napoleon, MD;  Location: [facility];  Service: Plastics;  Laterality: Bilateral;    CAPSULOTOMY Bilateral 07/25/2021   Procedure: CAPSULOTOMY;  Surgeon: Allena Napoleon, MD;  Location: [facility];  Service: Plastics;  Laterality: Bilateral;   COLECTOMY  06/27/2019   COLONOSCOPY  2015   ESOPHAGOGASTRODUODENOSCOPY (EGD) WITH PROPOFOL N/A 11/21/2022   Procedure: ESOPHAGOGASTRODUODENOSCOPY (EGD) WITH PROPOFOL;  Surgeon: Lemar Lofty., MD;  [facility];  Service: Gastroenterology;  Laterality: N/A;   FLEXIBLE SIGMOIDOSCOPY N/A 07/01/2020   Procedure: FLEXIBLE SIGMOIDOSCOPY;  Surgeon: Benancio Deeds, MD;  [facility];  Service: Gastroenterology;  Laterality: N/A;   FLEXIBLE SIGMOIDOSCOPY N/A 01/27/2021   Procedure: FLEXIBLE SIGMOIDOSCOPY;  Surgeon: Benancio Deeds, MD;  [facility];  Service: Gastroenterology;  Laterality: N/A;   FLEXIBLE SIGMOIDOSCOPY N/A 09/07/2021   Procedure: FLEXIBLE SIGMOIDOSCOPY;  Surgeon: Lynann Bologna, MD;  [facility];  Service: Gastroenterology;  Laterality: N/A;   FLEXIBLE SIGMOIDOSCOPY N/A 11/21/2022   Procedure: FLEXIBLE SIGMOIDOSCOPY;  Surgeon: Meridee Score Netty Starring., MD;  Location: [facility];  Service: Gastroenterology;  Laterality: N/A;   IR CATHETER TUBE CHANGE  03/07/2023   IR FLUORO GUIDE CV LINE RIGHT  01/09/2023   IR PATIENT EVAL TECH 0-60 MINS  04/24/2023   IR RADIOLOGIST EVAL & MGMT  04/13/2023   IR RADIOLOGIST EVAL & MGMT  05/07/2023   IR US GUIDE VASC ACCESS RIGHT  01/09/2023   LAPAROTOMY N/A 12/14/2022   Procedure: EXPLORATORY LAPAROTOMY WITH SMALL BOWEL RESECTION;  Surgeon: Moise Boring, MD;  Location[facility];  Service: General;  Laterality: N/A;   LAPAROTOMY N/A 12/16/2022   Procedure: REOPENING LAPAROTOMY, ILEOSTOMY;  ABDOMINAL CLOSURE;  Surgeon: Moise Boring, MD;  Location: [facility];  Service: General;  Laterality: N/A;   NIPPLE SPARING MASTECTOMY Bilateral 11/09/2020   Procedure: BILATERAL NIPPLE SPARING MASTECTOMY;  Surgeon: Emelia Loron, MD;   Location: [facility];  Service: General;  Laterality: Bilateral;   PORT-A-CATH REMOVAL N/A 07/25/2021   Procedure: REMOVAL PORT-A-CATH;  Surgeon: Allena Napoleon, MD;  Location: [facility];  Service: Plastics;  Laterality: N/A;   PORT-A-CATH REMOVAL Left 01/05/2023   Procedure: REMOVAL PORT-A-CATH;  Surgeon: Abigail Miyamoto, MD;  Location: [facility];  Service: General;  Laterality: Left;   PORTACATH PLACEMENT N/A 06/23/2020   Procedure: INSERTION PORT-A-CATH;  Surgeon: Emelia Loron, MD;  Location: [facility];  Service: General;  Laterality: N/A;  START TIME OF 11:00 AM FOR 60 MINUTES [place] [facility]   PORTACATH PLACEMENT Left 05/02/2022   Procedure: INSERTION PORT-A-CATH;  Surgeon: Emelia Loron, MD;  Location: [facility];  Service: General;  Laterality: Left;   RADIOACTIVE SEED GUIDED EXCISIONAL BREAST BIOPSY Right 05/02/2022   Procedure: RADIOACTIVE SEED GUIDED RIGHT BREAST MASS EXCISION;  Surgeon: Emelia Loron, MD;  Location: [facility];  Service: General;  Laterality: Right;   REMOVAL OF BILATERAL TISSUE EXPANDERS WITH PLACEMENT OF BILATERAL BREAST IMPLANTS Bilateral 07/25/2021   Procedure: REMOVAL OF BILATERAL TISSUE EXPANDERS WITH PLACEMENT OF BILATERAL BREAST IMPLANTS;  Surgeon: Allena Napoleon, MD;  Location: [facility];  Service: Plastics;  Laterality: Bilateral;  1.5     A IV Location/Drains/Wounds Patient Lines/Drains/Airways Status  Active Line/Drains/Airways     Name Placement date Placement time Site Days   CVC Double Lumen 01/09/23 Right Internal jugular 0 cm 01/09/23  1537  -- 120   Closed System Drain 1 Right Abdomen Bulb (JP) 14 Fr. 03/07/23  1200  Abdomen  63   Ileostomy LUQ 12/16/22  1000  LUQ  144            Intake/Output Last 24 hours  Intake/Output Summary (Last 24 hours) at 05/09/2023 2317 Last data filed at 05/09/2023 2243 Gross per 24 hour  Intake 2050 ml   Output --  Net 2050 ml    Labs/Imaging Results for orders placed or performed during the hospital encounter of 05/09/23 (from the past 48 hours)  Comprehensive metabolic panel     Status: Abnormal   Collection Time: 05/09/23  4:25 PM  Result Value Ref Range   Sodium 131 (L) 135 - 145 mmol/L   Potassium 3.6 3.5 - 5.1 mmol/L   Chloride 96 (L) 98 - 111 mmol/L   CO2 20 (L) 22 - 32 mmol/L   Glucose, Bld 97 70 - 99 mg/dL    Comment: Glucose reference range applies only to samples taken after fasting for at least 8 hours.   BUN 34 (H) 6 - 20 mg/dL   Creatinine, Ser 1.30 (H) 0.44 - 1.00 mg/dL   Calcium 9.1 8.9 - 86.5 mg/dL   Total Protein 8.5 (H) 6.5 - 8.1 g/dL   Albumin 3.5 3.5 - 5.0 g/dL   AST 88 (H) 15 - 41 U/L   ALT 111 (H) 0 - 44 U/L   Alkaline Phosphatase 294 (H) 38 - 126 U/L   Total Bilirubin 1.4 (H) 0.0 - 1.2 mg/dL   GFR, Estimated 47 (L) >60 mL/min    Comment: (NOTE) Calculated using the CKD-EPI Creatinine Equation (2021)    Anion gap 15 5 - 15    Comment: Performed at [facility], [address], [place], [place] [address]  CBC with Differential     Status: Abnormal   Collection Time: 05/09/23  4:25 PM  Result Value Ref Range   WBC 19.4 (H) 4.0 - 10.5 K/uL   RBC 3.98 3.87 - 5.11 MIL/uL   Hemoglobin 10.8 (L) 12.0 - 15.0 g/dL   HCT 62.9 (L) 52.8 - 41.3 %   MCV 84.9 80.0 - 100.0 fL   MCH 27.1 26.0 - 34.0 pg   MCHC 32.0 30.0 - 36.0 g/dL   RDW 24.4 01.0 - 27.2 %   Platelets 255 150 - 400 K/uL   nRBC 0.0 0.0 - 0.2 %   Neutrophils Relative % 94 %   Neutro Abs 18.3 (H) 1.7 - 7.7 K/uL   Lymphocytes Relative 3 %   Lymphs Abs 0.5 (L) 0.7 - 4.0 K/uL   Monocytes Relative 2 %   Monocytes Absolute 0.3 0.1 - 1.0 K/uL   Eosinophils Relative 0 %   Eosinophils Absolute 0.0 0.0 - 0.5 K/uL   Basophils Relative 0 %   Basophils Absolute 0.0 0.0 - 0.1 K/uL   Immature Granulocytes 1 %   Abs Immature Granulocytes 0.13 (H) 0.00 - 0.07 K/uL    Comment: Performed at  [facility], [address], [place], [place] [address]  Protime-INR     Status: Abnormal   Collection Time: 05/09/23  4:25 PM  Result Value Ref Range   Prothrombin Time 15.6 (H) 11.4 - 15.2 seconds   INR 1.2 0.8 - 1.2  Comment: (NOTE) INR goal varies based on device and disease states. Performed at [facility], [address] [place], [place] [address]   APTT     Status: Abnormal   Collection Time: 05/09/23  4:25 PM  Result Value Ref Range   aPTT 40 (H) 24 - 36 seconds    Comment:        IF BASELINE aPTT IS ELEVATED, SUGGEST PATIENT RISK ASSESSMENT BE USED TO DETERMINE APPROPRIATE ANTICOAGULANT THERAPY. Performed at [facility], [address], [place], [place] [address]   I-Stat Lactic Acid, ED     Status: None   Collection Time: 05/09/23  4:35 PM  Result Value Ref Range   Lactic Acid, Venous 1.0 0.5 - 1.9 mmol/L  Urinalysis, w/ Reflex to Culture (Infection Suspected) -Urine, Clean Catch     Status: None   Collection Time: 05/09/23  8:39 PM  Result Value Ref Range   Specimen Source URINE, CLEAN CATCH    Color, Urine YELLOW YELLOW   APPearance CLEAR CLEAR   Specific Gravity, Urine 1.013 1.005 - 1.030   pH 6.0 5.0 - 8.0   Glucose, UA NEGATIVE NEGATIVE mg/dL   Hgb urine dipstick NEGATIVE NEGATIVE   Bilirubin Urine NEGATIVE NEGATIVE   Ketones, ur NEGATIVE NEGATIVE mg/dL   Protein, ur NEGATIVE NEGATIVE mg/dL   Nitrite NEGATIVE NEGATIVE   Leukocytes,Ua NEGATIVE NEGATIVE   RBC / HPF 0-5 0 - 5 RBC/hpf   WBC, UA 0-5 0 - 5 WBC/hpf    Comment:        Reflex urine culture not performed if WBC <=10, OR if Squamous epithelial cells >5. If Squamous epithelial cells >5 suggest recollection.    Bacteria, UA NONE SEEN NONE SEEN   Squamous Epithelial / HPF 0-5 0 - 5 /HPF    Comment: Performed at [facility], [address], [place], [place] [address]   *Note: Due to a large number of results and/or  encounters for the requested time period, some results have not been displayed. A complete set of results can be found in Results Review.   CT ABDOMEN PELVIS W CONTRAST Result Date: 05/09/2023 CLINICAL DATA:  Provided history of sepsis. Patient reports weakness and chills. Radiologic records indicates history of metastatic breast cancer status post partial colectomy. EXAM: CT ABDOMEN AND PELVIS WITH CONTRAST TECHNIQUE: Multidetector CT imaging of the abdomen and pelvis was performed using the standard protocol following bolus administration of intravenous contrast. RADIATION DOSE REDUCTION: This exam was performed according to the departmental dose-optimization program which includes automated exposure control, adjustment of the mA and/or kV according to patient size and/or use of iterative reconstruction technique. CONTRAST:  OMNIPAQUE IOHEXOL 300 MG/ML  SOLN COMPARISON:  CT 04/13/2023, abscess injection 2 days ago reviewed FINDINGS: Lower chest: Scarring at the left lung base. Hepatobiliary: No focal liver abnormality. Possible small gallstone series 2, image 30. No abnormal gallbladder distention or pericholecystic inflammation. Pancreas: No ductal dilatation or inflammation. Spleen: Normal in size without focal abnormality. Adrenals/Urinary Tract: No adrenal nodule. Slight bilateral renal collecting system prominence, left greater than right. Equivocal enhancement of the left renal pelvis. No renal calculi. Unremarkable urinary bladder, no bladder wall thickening. Stomach/Bowel: Bowel assessment is limited in the absence of enteric contrast and paucity of intra-abdominal fat. Decompressed stomach. Subtotal colectomy. Small amount of fluid within the rectum, previous contrast in the rectum has cleared. Left abdominal ileostomy. Suggestion of mild diffuse small bowel wall thickening. No bowel pneumatosis. No abnormal  distension or obstruction. Vascular/Lymphatic: Normal caliber abdominal aorta. Patent  portal and splenic veins. Narrowing of the left renal vein as it courses under the SMA is again seen. No gross abdominopelvic adenopathy. Reproductive: Again seen uterine fibroids. Small amount of free fluid in the right adnexa/pelvis is unchanged from prior exam. Other: Small amount of stranding in the right mid abdomen, similar to prior. Small amount of free fluid in the right pelvis and adnexa which was present on prior exam. No free air. Musculoskeletal: No free air, free fluid, or intra-abdominal fluid collection. Left breast implant. Right mastectomy. IMPRESSION: 1. Suggestion of mild diffuse small bowel wall thickening which can be seen with enteritis. No obstruction. 2. Left-sided ileostomy post colectomy. 3. Small amount of free fluid in the right pelvis is well as adnexa, unchanged from prior exam. 4. Chronic uterine fibroids. Electronically Signed   By: Narda Rutherford M.D.   On: 05/09/2023 21:40   DG Chest Port 1 View Result Date: 05/09/2023 CLINICAL DATA:  Hypertension, weakness, chills with nausea and vomiting, concern for sepsis EXAM: PORTABLE CHEST 1 VIEW COMPARISON:  02/11/2023 FINDINGS: Normal heart size and vascularity. Left breast implant noted over the left hemithorax. Lungs remain clear. No focal pneumonia, collapse or consolidation. Negative for edema, large effusion or pneumothorax. Trachea midline. Right axillary clips present. Right IJ tunneled PICC line tip SVC RA junction. Overall stable exam. IMPRESSION: No acute chest process. Electronically Signed   By: Judie Petit.  Shick M.D.   On: 05/09/2023 16:45    Pending Labs Unresulted Labs (From admission, onward)     Start     Ordered   05/16/23 0500  Creatinine, serum  (enoxaparin (LOVENOX)    CrCl >/= 30 with major trauma, spinal cord injury, or selected orthopedic surgery)  Weekly,   R     Comments: while on enoxaparin therapy.    05/09/23 2237   05/09/23 2236  Gastrointestinal Panel by PCR , Stool  (Gastrointestinal Panel by PCR,  Stool                                                                                                                                                     **Does Not include CLOSTRIDIUM DIFFICILE testing. **If CDIFF testing is needed, place order from the "C Difficile Testing" order set.**)  Once,   R        05/09/23 2237   05/09/23 2234  Gamma GT  Once,   R        05/09/23 2237   05/09/23 2230  CBC  (enoxaparin (LOVENOX)    CrCl >/= 30 with major trauma, spinal cord injury, or selected orthopedic surgery)  Once,   R       Comments: Baseline for enoxaparin therapy IF NOT already drawn.  Notify MD if PLT < 100 K.    05/09/23  2237   05/09/23 2230  Creatinine, serum  (enoxaparin (LOVENOX)    CrCl >/= 30 with major trauma, spinal cord injury, or selected orthopedic surgery)  Once,   R       Comments: Baseline for enoxaparin therapy IF NOT already drawn.    05/09/23 2237   05/09/23 2157  Resp panel by RT-PCR (RSV, Flu A&B, Covid) Anterior Nasal Swab  Once,   URGENT        05/09/23 2156   05/09/23 2102  Fungus culture, blood  Once,   AD        05/09/23 2101   05/09/23 1551  Blood Culture (routine x 2)  (Undifferentiated presentation (screening labs and basic nursing orders))  BLOOD CULTURE X 2,   STAT      05/09/23 1551            Vitals/Pain Today's Vitals   05/09/23 2130 05/09/23 2216 05/09/23 2228 05/09/23 2245  BP: 97/70 91/67  98/72  Pulse: (!) 108 90  99  Resp:  (!) 21    Temp:   98.3 F (36.8 C)   TempSrc:   Oral   SpO2: 99% 100%  100%  Weight:      Height:      PainSc:        Isolation Precautions Enteric precautions (UV disinfection)  Medications Medications  vancomycin (VANCOCIN) IVPB 1000 mg/200 mL premix (1,000 mg Intravenous New Bag/Given 05/09/23 2235)  norepinephrine (LEVOPHED) 4mg  in (0.016 mg/mL) premix infusion (2 mcg/min Intravenous New Bag/Given 05/09/23 2037)  docusate sodium (COLACE) capsule 100 mg (has no administration in time range)  polyethylene  glycol (MIRALAX / GLYCOLAX) packet 17 g (has no administration in time range)  enoxaparin (LOVENOX) injection 30 mg (has no administration in time range)  ondansetron (ZOFRAN) injection 4 mg (has no administration in time range)  sodium chloride flush (NS) 0.9 % injection 10-40 mL (has no administration in time range)  sodium chloride flush (NS) 0.9 % injection 10-40 mL (has no administration in time range)  piperacillin-tazobactam (ZOSYN) IVPB 3.375 g (has no administration in time range)  fluconazole (DIFLUCAN) IVPB 400 mg (has no administration in time range)    Followed by  fluconazole (DIFLUCAN) IVPB 200 mg (has no administration in time range)  lactated ringers bolus 1,000 mL (0 mLs Intravenous Stopped 05/09/23 1932)  lactated ringers bolus 1,000 mL (0 mLs Intravenous Stopped 05/09/23 2032)  albumin human 25 % solution 25 g (0 g Intravenous Stopped 05/09/23 1931)  iohexol (OMNIPAQUE) 300 MG/ML solution 100 mL (100 mLs Intravenous Contrast Given 05/09/23 2013)    Mobility walks with person assist       R Report given to:

## 2023-05-09 NOTE — ED Triage Notes (Signed)
C/o weakness, chills, and n/v that started today.

## 2023-05-09 NOTE — Progress Notes (Signed)
ED Pharmacy Antibiotic Sign Off An antibiotic consult was received from an ED provider for vancomycin and cefepime per pharmacy dosing for sepsis. A chart review was completed to assess appropriateness.   The following one time order(s) were placed:  Vancomycin 1000 mg Cefepime 2 g  Further antibiotic and/or antibiotic pharmacy consults should be ordered by the admitting provider if indicated.   Thank you for allowing pharmacy to be a part of this patient's care.   Pricilla Riffle, PharmD, BCPS Clinical Pharmacist 05/09/2023 5:07 PM

## 2023-05-09 NOTE — H&P (Signed)
NAME:  [name], MRN:  [id number], DOB:  01/22/1976, LOS: 0 ADMISSION DATE:  05/09/2023, CONSULTATION DATE:  05/09/23 REFERRING MD:  EDP, CHIEF COMPLAINT:  shock   History of Present Illness:  48 yo with complicated pmh presented today with weakness, chills, n/v that started today.  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 rate (!) 23, height 5\' 4"  (1.626 m), weight 49 kg, last menstrual period 04/24/2021, SpO2 99%.        Intake/Output Summary (Last 24 hours) at 05/09/2023 2202 Last data filed at 05/09/2023 2032 Gross per 24 hour  Intake 2050 ml  Output --  Net 2050 ml   Filed Weights   05/09/23 1550  Weight: 49 kg    Examination: General: *** HENT: *** Lungs: *** Cardiovascular: *** Abdomen: *** Extremities: *** Neuro: *** GU: deferred  Resolved Hospital Problem list     Assessment & Plan:  Shock, undifferentiated Presumed sepsis, undetermined etiology suspected enteritis with small bowel inflammation on imaging H/o Chron's disease s/p subtotal colectomy Recurrent sbo"s 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 AKI Transaminitis Elevated alk phos Leukocytosis Chronic anemia  -titrate vasopressors to map >65 -cont with abx broad, covering certainly for gn and anaerobes -add antifungal after fungal cx have been obtained -thankfully lactic acid is normal        Best Practice (right click and "Reselect all SmartList Selections" daily)   Diet/type: {diet type:25684} DVT prophylaxis  {anticoagulation:25687} Pressure ulcer(s): {pressure ulcer(s):31683} GI prophylaxis: {WU:98119} Lines: {Central Venous Access:25771} Foley:  {Central Venous Access:25691} Code Status:  {Code Status:26939} Last date of multidisciplinary goals of care discussion [***]  Labs   CBC: Recent Labs  Lab 05/09/23 1625  WBC 19.4*  NEUTROABS 18.3*  HGB 10.8*  HCT 33.8*  MCV 84.9  PLT 255    Basic Metabolic Panel: Recent Labs  Lab 05/09/23 1625  NA 131*  K 3.6  CL 96*  CO2 20*  GLUCOSE 97  BUN 34*  CREATININE 1.40*  CALCIUM 9.1   GFR: Estimated Creatinine Clearance: 39.3 mL/min (A) (by C-G formula based on SCr of 1.4 mg/dL (H)). Recent Labs  Lab 05/09/23 1625 05/09/23 1635  WBC 19.4*  --   LATICACIDVEN  --  1.0    Liver Function Tests: Recent Labs  Lab 05/09/23 1625  AST 88*  ALT 111*  ALKPHOS 294*  BILITOT 1.4*  PROT 8.5*  ALBUMIN 3.5   No results for input(s): "LIPASE", "AMYLASE" in the last 168 hours. No results for input(s): "AMMONIA" in the last 168 hours.  ABG    Component Value Date/Time   PHART 7.47 (H) 12/18/2022 0328   PCO2ART 29 (L) 12/18/2022 0328   PO2ART 121 (H) 12/18/2022 0328   HCO3 21.3 12/18/2022 0328   TCO2 25 12/13/2022 0818   ACIDBASEDEF 1.5 12/18/2022 0328   O2SAT 95.9 12/18/2022 0328     Coagulation Profile: Recent Labs  Lab 05/09/23 1625  INR 1.2    Cardiac Enzymes: No results for input(s): "CKTOTAL", "CKMB", "CKMBINDEX", "TROPONINI" in the last 168 hours.  HbA1C: Hgb A1c MFr Bld  Date/Time Value Ref Range Status  12/13/2022 06:45 AM 5.7 (H) 4.8 - 5.6 % Final    Comment:    (NOTE) Pre diabetes:          5.7%-6.4%  Diabetes:              >6.4%  Glycemic control for   <7.0% adults with diabetes     CBG: No results for input(s): "GLUCAP" in the last 168 hours.  Review of Systems:   As per HPI  Past Medical History:  She,  has a past medical history of Breast cancer (HCC), Colon stricture (HCC) (05/07/2019),  Colonic stricture (HCC) (11/19/2022), Crohn's colitis, with intestinal obstruction (HCC) (09/17/2013), Crohn's disease (HCC), Exacerbation of Crohn's disease of large intestine (HCC) (12/13/2022), Family history of breast cancer (06/11/2020), History of abdominal subtotal colectomy (11/19/2022), 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 (10/17/2010), Rectal bleeding, Uterine fibroid, and Vitamin D deficiency (09/24/2013).   Surgical History:   Past Surgical History:  Procedure Laterality Date   AXILLARY LYMPH NODE DISSECTION Right 11/09/2020   Procedure: RIGHT AXILLARY LYMPH NODE DISSECTION;  Surgeon: Emelia Loron, MD;  Location: [facility];  Service: General;  Laterality: Right;   BIOPSY  07/01/2020   Procedure: BIOPSY;  Surgeon: Benancio Deeds, MD;  Location: [facility];  Service: Gastroenterology;;   BIOPSY  01/27/2021   Procedure: BIOPSY;  Surgeon: Benancio Deeds, MD;  Location: [facility];  Service: Gastroenterology;;   BIOPSY  09/07/2021   Procedure: BIOPSY;  Surgeon: Lynann Bologna, MD;  Location: [facility];  Service: Gastroenterology;;   BIOPSY  11/21/2022   Procedure: BIOPSY;  Surgeon: Lemar Lofty., MD;  Location: [facility];  Service: Gastroenterology;;   BREAST BIOPSY Right 03/24/2022   [place] RT BREAST BX W LOC DEV 1ST LESION IMG BX SPEC US GUIDE 03/24/2022 [facility]   BREAST BIOPSY  04/28/2022   [place] RT RADIOACTIVE SEED LOC 04/28/2022 [facility]   BREAST IMPLANT REMOVAL Right 08/07/2022   Procedure: REMOVAL  OF RIGHT BREAST IMPLANTS;  Surgeon: Allena Napoleon, MD;  Location: [facility];  Service: Plastics;  Laterality: Right;   BREAST RECONSTRUCTION WITH PLACEMENT OF TISSUE EXPANDER AND FLEX HD (ACELLULAR HYDRATED DERMIS) Bilateral 11/09/2020   Procedure: BILATERAL BREAST RECONSTRUCTION WITH PLACEMENT OF TISSUE EXPANDER AND FLEX HD (ACELLULAR HYDRATED DERMIS);  Surgeon: Allena Napoleon, MD;  Location: [facility];  Service: Plastics;  Laterality: Bilateral;   CAPSULOTOMY Bilateral 07/25/2021   Procedure: CAPSULOTOMY;  Surgeon: Allena Napoleon, MD;  Location:  SUR[facility]ERY CENTER;  Service: Plastics;  Laterality: Bilateral;   COLECTOMY  06/27/2019   COLONOSCOPY  2015   ESOPHAGOGASTRODUODENOSCOPY (EGD) WITH PROPOFOL N/A 11/21/2022   Procedure: ESOPHAGOGASTRODUODENOSCOPY (EGD) WITH PROPOFOL;  Surgeon: Lemar Lofty., MD;  Location: [facility];  Service: Gastroenterology;  Laterality: N/A;   FLEXIBLE SIGMOIDOSCOPY N/A 07/01/2020   Procedure: FLEXIBLE SIGMOIDOSCOPY;  Surgeon: Benancio Deeds, MD;  Location: [facility];  Service: Gastroenterology;  Laterality: N/A;   FLEXIBLE SIGMOIDOSCOPY N/A 01/27/2021   Procedure: FLEXIBLE SIGMOIDOSCOPY;  Surgeon: Benancio Deeds, MD;  Location: [facility];  Service: Gastroenterology;  Laterality: N/A;   FLEXIBLE SIGMOIDOSCOPY N/A 09/07/2021   Procedure: FLEXIBLE SIGMOIDOSCOPY;  Surgeon: Lynann Bologna, MD;  Location: [facility];  Service: Gastroenterology;  Laterality: N/A;   FLEXIBLE SIGMOIDOSCOPY N/A 11/21/2022   Procedure: FLEXIBLE SIGMOIDOSCOPY;  Surgeon: Meridee Score Netty Starring., MD;  Location: [facility];  Service: Gastroenterology;  Laterality: N/A;  IR CATHETER TUBE CHANGE  03/07/2023   IR FLUORO GUIDE CV LINE RIGHT  01/09/2023   IR PATIENT EVAL TECH 0-60 MINS  04/24/2023   IR RADIOLOGIST EVAL & MGMT  04/13/2023   IR RADIOLOGIST EVAL & MGMT  05/07/2023   IR US GUIDE VASC ACCESS RIGHT  01/09/2023   LAPAROTOMY N/A 12/14/2022   Procedure: EXPLORATORY LAPAROTOMY WITH SMALL BOWEL RESECTION;  Surgeon: Moise Boring, MD;  Location: [facility];  Service: General;  Laterality: N/A;   LAPAROTOMY N/A 12/16/2022   Procedure: REOPENING LAPAROTOMY, ILEOSTOMY;  ABDOMINAL CLOSURE;  Surgeon: Moise Boring, MD;  Location: [facility];  Service: General;  Laterality: N/A;   NIPPLE SPARING MASTECTOMY Bilateral  11/09/2020   Procedure: BILATERAL NIPPLE SPARING MASTECTOMY;  Surgeon: Emelia Loron, MD;  Location: [facility];  Service: General;  Laterality: Bilateral;   PORT-A-CATH REMOVAL N/A 07/25/2021   Procedure: REMOVAL PORT-A-CATH;  Surgeon: Allena Napoleon, MD;  Location: [facility];  Service: Plastics;  Laterality: N/A;   PORT-A-CATH REMOVAL Left 01/05/2023   Procedure: REMOVAL PORT-A-CATH;  Surgeon: Abigail Miyamoto, MD;  Location: [facility];  Service: General;  Laterality: Left;   PORTACATH PLACEMENT N/A 06/23/2020   Procedure: INSERTION PORT-A-CATH;  Surgeon: Emelia Loron, MD;  Location: [facility];  Service: General;  Laterality: N/A;  START TIME OF 11:00 AM FOR 60 MINUTES [place] [facility]   PORTACATH PLACEMENT Left 05/02/2022   Procedure: INSERTION PORT-A-CATH;  Surgeon: Emelia Loron, MD;  Location: [facility];  Service: General;  Laterality: Left;   RADIOACTIVE SEED GUIDED EXCISIONAL BREAST BIOPSY Right 05/02/2022   Procedure: RADIOACTIVE SEED GUIDED RIGHT BREAST MASS EXCISION;  Surgeon: Emelia Loron, MD;  Location: [facility];  Service: General;  Laterality: Right;   REMOVAL OF BILATERAL TISSUE EXPANDERS WITH PLACEMENT OF BILATERAL BREAST IMPLANTS Bilateral 07/25/2021   Procedure: REMOVAL OF BILATERAL TISSUE EXPANDERS WITH PLACEMENT OF BILATERAL BREAST IMPLANTS;  Surgeon: Allena Napoleon, MD;  Location: [facility];  Service: Plastics;  Laterality: Bilateral;  1.5     Social History:   reports that she has quit smoking. Her smoking use included cigarettes. She has never used smokeless tobacco. She reports that she does not currently use alcohol after a past usage of about 1.0 - 2.0 standard drink of alcohol per week. She reports that she does not use drugs.   Family History:  Her family history includes Breast cancer in her cousin and maternal aunt; Diabetes in her father and paternal aunt. There  is no history of Esophageal cancer, Rectal cancer, Stomach cancer, Colon polyps, or Colon cancer.   Allergies Allergies  Allergen Reactions   Nsaids Other (See Comments)    Crohns disease/ IBD   Chlorhexidine Gluconate Itching    Able to use CHG swabs for ports, reaction with direct skin contact   Hydromorphone Other (See Comments)    Confusion/sedation   Lactose Other (See Comments)    Upset stomach/diarrhea   Lactose Intolerance (Gi) Other (See Comments)    Upset stomach/diarrhea     Home Medications  Prior to Admission medications   Medication Sig Start Date End Date Taking? Authorizing Provider  acetaminophen (TYLENOL) 500 MG tablet Take 500-1,000 mg by mouth every 6 (six) hours as needed for mild pain. Patient not taking: Reported on 02/19/2023    [provider]  albuterol (PROVENTIL) (2.5 MG/3ML) 0.083% nebulizer solution Take 3 mLs (2.5 mg total) by nebulization every 4 (four) hours as  needed for wheezing or shortness of breath. Patient not taking: Reported on 02/19/2023 01/12/23   Kathlen Mody, MD  ALPRAZolam Prudy Feeler) 0.25 MG tablet Take 1 tablet (0.25 mg total) by mouth 2 (two) times daily. Patient not taking: Reported on 02/19/2023 01/12/23   Kathlen Mody, MD  amoxicillin-clavulanate (AUGMENTIN) 875-125 MG tablet Take 1 tablet by mouth 2 (two) times daily. 02/19/23   Danelle Earthly, MD  apixaban (ELIQUIS) 5 MG TABS tablet Take 5 mg by mouth 2 (two) times daily. Patient not taking: Reported on 02/19/2023    [provider]  artificial tears (LACRILUBE) OINT ophthalmic ointment Place into both eyes every 4 (four) hours as needed for dry eyes. Patient not taking: Reported on 02/19/2023 01/12/23   Kathlen Mody, MD  ascorbic acid (VITAMIN C) 500 MG tablet Take 1 tablet (500 mg total) by mouth 2 (two) times daily. Patient not taking: Reported on 02/19/2023 01/12/23   Kathlen Mody, MD  cholecalciferol (VITAMIN D3) 25 MCG (1000 UNIT) tablet Take 1,000 Units by  mouth daily. Patient not taking: Reported on 02/19/2023    [provider]  diphenoxylate-atropine (LOMOTIL) 2.5-0.025 MG tablet Take 2 tablets by mouth 4 (four) times daily. 01/12/23   Kathlen Mody, MD  famotidine (PEPCID) 40 MG/5ML suspension Take 2.5 mLs (20 mg total) by mouth daily. Patient not taking: Reported on 02/19/2023 01/13/23   Kathlen Mody, MD  ferrous sulfate 300 (60 Fe) MG/5ML syrup Take 5 mLs (300 mg total) by mouth 2 (two) times daily. 01/16/23   Kathlen Mody, MD  fiber (NUTRISOURCE FIBER) PACK packet Take 1 packet by mouth 3 (three) times daily. Patient not taking: Reported on 02/19/2023 01/12/23   Kathlen Mody, MD  food thickener (SIMPLYTHICK, NECTAR/LEVEL 2/MILDLY THICK,) GEL Take 1 packet by mouth as needed. Patient not taking: Reported on 02/19/2023 01/12/23   Kathlen Mody, MD  gabapentin (NEURONTIN) 250 MG/5ML solution Take 4 mLs (200 mg total) by mouth every 8 (eight) hours. Patient not taking: Reported on 02/19/2023 01/12/23   Kathlen Mody, MD  insulin aspart (NOVOLOG) 100 UNIT/ML injection Inject 0-9 Units into the skin 3 (three) times daily with meals. Patient not taking: Reported on 02/11/2023 01/12/23   Kathlen Mody, MD  lip balm (CARMEX) ointment Apply topically as needed. Patient not taking: Reported on 02/11/2023 01/12/23   Kathlen Mody, MD  loperamide (IMODIUM A-D) 2 MG tablet Take 1 tablet (2 mg total) by mouth 4 (four) times daily as needed for diarrhea or loose stools. 02/12/23   Rolly Salter, MD  metoprolol tartrate (LOPRESSOR) 25 MG tablet Take 1 tablet (25 mg total) by mouth 2 (two) times daily. 02/12/23   Rolly Salter, MD  Multiple Vitamin (MULTIVITAMIN) LIQD Place 15 mLs into feeding tube daily. Patient not taking: Reported on 02/19/2023 01/17/23   Kathlen Mody, MD  Nutritional Supplements (FEEDING SUPPLEMENT, KATE FARMS STANDARD 1.4,) LIQD liquid Take 325 mLs by mouth 2 (two) times daily between meals. Patient not taking: Reported on 02/11/2023  01/12/23   Kathlen Mody, MD  Nutritional Supplements (FEEDING SUPPLEMENT, KATE FARMS STANDARD 1.4,) LIQD liquid Take 360 mLs by mouth daily. Patient not taking: Reported on 02/11/2023 01/12/23   Kathlen Mody, MD  Nutritional Supplements (FEEDING SUPPLEMENT, KATE FARMS STANDARD 1.4,) LIQD liquid Take 325 mLs by mouth 2 (two) times daily between meals. Patient not taking: Reported on 02/11/2023 01/16/23   Kathlen Mody, MD  nystatin (MYCOSTATIN) 100000 UNIT/ML suspension Take 5 mLs (500,000 Units total) by mouth 4 (four) times daily. Patient  not taking: Reported on 02/11/2023 01/16/23   Kathlen Mody, MD  Opium 10 MG/ML (1%) TINC Place 1 mL (10 mg total) into feeding tube 4 (four) times daily. Patient not taking: Reported on 02/19/2023 01/16/23   Kathlen Mody, MD  oxyCODONE (OXY IR/ROXICODONE) 5 MG immediate release tablet Take 1-2 tablets (5-10 mg total) by mouth every 4 (four) hours as needed for breakthrough pain. Patient not taking: Reported on 02/19/2023 01/12/23   Kathlen Mody, MD  Vitamin D, Ergocalciferol, (DRISDOL) 1.25 MG (50000 UNIT) CAPS capsule Take 1 capsule (50,000 Units total) by mouth every 7 (seven) days. Patient not taking: Reported on 02/19/2023 01/18/23   Kathlen Mody, MD  Zinc 220 (50 Zn) MG CAPS Take 1 capsule by mouth daily. 02/12/23   [provider]     Critical care time: ***

## 2023-05-09 NOTE — ED Notes (Signed)
Pt ambulated to the bathroom w/sba and w/out any difficulties. Denies any s/sx of lightheadedness, dizziness, or SOB.

## 2023-05-09 NOTE — ED Provider Notes (Signed)
[place] EMERGENCY DEPARTMENT AT [facility] Provider Note  CSN: [id number] Arrival date & time: 05/09/23 1536  Chief Complaint(s) Hypotension  HPI [name] is a 48 y.o. 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  States that her home health nurse came to see her this afternoon and found her heart rate in the 160s and recommended that she come to the emergency department for further evaluation.  States that she has not had her TPN in over 24 hours.  Denies chest pain, shortness of breath, abdominal pain, headache, documented fever or other systemic symptoms.  Patient arrives hypotensive and tachycardic but afebrile here in the ER.   Past Medical History Past Medical History:  Diagnosis Date   Breast cancer (HCC)    right   Colon stricture (HCC) 05/07/2019   Colonic stricture (HCC) 11/19/2022   Crohn's colitis, with intestinal obstruction (HCC) 09/17/2013   Dx 2006 - right and left colon involved 2011 - pan-colitis 09/17/2013 left colitis 60-20 cm March 2021 subtotal colectomy ileosigmoid anastomosis for colonic strictures-Dr. Maisie Fus     Crohn's disease [facility])    Exacerbation of Crohn's disease of large intestine (HCC) 12/13/2022   Family history of breast cancer 06/11/2020   History of abdominal subtotal colectomy 11/19/2022   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 10/17/2010   Rectal bleeding    Uterine fibroid    Vitamin D deficiency 09/24/2013   Patient Active Problem List   Diagnosis Date Noted   Irritant contact dermatitis associated with fecal stoma 02/20/2023   Ileostomy care (HCC) 02/20/2023   Acute pulmonary embolism without acute cor pulmonale (HCC) 12/29/2022   Fever, unspecified 12/28/2022   Palliative care by specialist 12/27/2022   Abdominal pain 12/27/2022    Malnutrition of moderate degree 12/24/2022   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 12/20/2022   SBO with strangulation s/p resection 12/14/2022 12/20/2022   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   Septic shock (HCC) 12/14/2022   Cardiac arrest (HCC) 12/13/2022   Chronic disease anemia 11/19/2022   Protein-calorie malnutrition, severe (HCC) 11/19/2022   Failure to thrive in adult 11/19/2022   Anorexia 11/19/2022   Nausea 11/19/2022   Leukocytosis 05/27/2021   Crohn's disease of both small and large intestine with intestinal obstruction (HCC)    Breast cancer (HCC) 11/09/2020   Port-A-Cath in place 07/15/2020   Hypokalemia 06/30/2020   Genetic testing 06/25/2020   Family history of breast cancer 06/11/2020   Malignant neoplasm of upper-outer quadrant of right breast in female, estrogen receptor positive (HCC) 06/04/2020   Abnormal finding present on diagnostic imaging of uterus 05/07/2019   Generalized abdominal pain    Vitamin D deficiency 09/24/2013   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 10/17/2010   Home Medication(s) Prior to Admission medications   Medication Sig Start Date End Date Taking? Authorizing Provider  acetaminophen (TYLENOL) 500 MG tablet Take 500-1,000 mg by mouth every 6 (six) hours as needed for mild pain. Patient not taking: Reported on 02/19/2023    [provider]  albuterol (PROVENTIL) (2.5 MG/3ML) 0.083% nebulizer solution Take 3 mLs (2.5 mg total) by nebulization every 4 (four) hours as needed for wheezing or shortness of breath. Patient not taking: Reported on 02/19/2023 01/12/23   Kathlen Mody, MD  ALPRAZolam Prudy Feeler) 0.25 MG tablet Take 1  tablet (0.25 mg total) by mouth 2 (two) times daily. Patient not taking: Reported on 02/19/2023 01/12/23   Kathlen Mody, MD  amoxicillin-clavulanate (AUGMENTIN) 875-125 MG tablet Take 1 tablet by mouth 2 (two) times daily. 02/19/23   Danelle Earthly, MD  apixaban (ELIQUIS) 5 MG TABS  tablet Take 5 mg by mouth 2 (two) times daily. Patient not taking: Reported on 02/19/2023    [provider]  artificial tears (LACRILUBE) OINT ophthalmic ointment Place into both eyes every 4 (four) hours as needed for dry eyes. Patient not taking: Reported on 02/19/2023 01/12/23   Kathlen Mody, MD  ascorbic acid (VITAMIN C) 500 MG tablet Take 1 tablet (500 mg total) by mouth 2 (two) times daily. Patient not taking: Reported on 02/19/2023 01/12/23   Kathlen Mody, MD  cholecalciferol (VITAMIN D3) 25 MCG (1000 UNIT) tablet Take 1,000 Units by mouth daily. Patient not taking: Reported on 02/19/2023    [provider]  diphenoxylate-atropine (LOMOTIL) 2.5-0.025 MG tablet Take 2 tablets by mouth 4 (four) times daily. 01/12/23   Kathlen Mody, MD  famotidine (PEPCID) 40 MG/5ML suspension Take 2.5 mLs (20 mg total) by mouth daily. Patient not taking: Reported on 02/19/2023 01/13/23   Kathlen Mody, MD  ferrous sulfate 300 (60 Fe) MG/5ML syrup Take 5 mLs (300 mg total) by mouth 2 (two) times daily. 01/16/23   Kathlen Mody, MD  fiber (NUTRISOURCE FIBER) PACK packet Take 1 packet by mouth 3 (three) times daily. Patient not taking: Reported on 02/19/2023 01/12/23   Kathlen Mody, MD  food thickener (SIMPLYTHICK, NECTAR/LEVEL 2/MILDLY THICK,) GEL Take 1 packet by mouth as needed. Patient not taking: Reported on 02/19/2023 01/12/23   Kathlen Mody, MD  gabapentin (NEURONTIN) 250 MG/5ML solution Take 4 mLs (200 mg total) by mouth every 8 (eight) hours. Patient not taking: Reported on 02/19/2023 01/12/23   Kathlen Mody, MD  insulin aspart (NOVOLOG) 100 UNIT/ML injection Inject 0-9 Units into the skin 3 (three) times daily with meals. Patient not taking: Reported on 02/11/2023 01/12/23   Kathlen Mody, MD  lip balm (CARMEX) ointment Apply topically as needed. Patient not taking: Reported on 02/11/2023 01/12/23   Kathlen Mody, MD  loperamide (IMODIUM A-D) 2 MG tablet Take 1 tablet (2 mg total) by  mouth 4 (four) times daily as needed for diarrhea or loose stools. 02/12/23   Rolly Salter, MD  metoprolol tartrate (LOPRESSOR) 25 MG tablet Take 1 tablet (25 mg total) by mouth 2 (two) times daily. 02/12/23   Rolly Salter, MD  Multiple Vitamin (MULTIVITAMIN) LIQD Place 15 mLs into feeding tube daily. Patient not taking: Reported on 02/19/2023 01/17/23   Kathlen Mody, MD  Nutritional Supplements (FEEDING SUPPLEMENT, KATE FARMS STANDARD 1.4,) LIQD liquid Take 325 mLs by mouth 2 (two) times daily between meals. Patient not taking: Reported on 02/11/2023 01/12/23   Kathlen Mody, MD  Nutritional Supplements (FEEDING SUPPLEMENT, KATE FARMS STANDARD 1.4,) LIQD liquid Take 360 mLs by mouth daily. Patient not taking: Reported on 02/11/2023 01/12/23   Kathlen Mody, MD  Nutritional Supplements (FEEDING SUPPLEMENT, KATE FARMS STANDARD 1.4,) LIQD liquid Take 325 mLs by mouth 2 (two) times daily between meals. Patient not taking: Reported on 02/11/2023 01/16/23   Kathlen Mody, MD  nystatin (MYCOSTATIN) 100000 UNIT/ML suspension Take 5 mLs (500,000 Units total) by mouth 4 (four) times daily. Patient not taking: Reported on 02/11/2023 01/16/23   Kathlen Mody, MD  Opium 10 MG/ML (1%) TINC Place 1 mL (10 mg total) into feeding tube 4 (  four) times daily. Patient not taking: Reported on 02/19/2023 01/16/23   Kathlen Mody, MD  oxyCODONE (OXY IR/ROXICODONE) 5 MG immediate release tablet Take 1-2 tablets (5-10 mg total) by mouth every 4 (four) hours as needed for breakthrough pain. Patient not taking: Reported on 02/19/2023 01/12/23   Kathlen Mody, MD  Vitamin D, Ergocalciferol, (DRISDOL) 1.25 MG (50000 UNIT) CAPS capsule Take 1 capsule (50,000 Units total) by mouth every 7 (seven) days. Patient not taking: Reported on 02/19/2023 01/18/23   Kathlen Mody, MD  Zinc 220 (50 Zn) MG CAPS Take 1 capsule by mouth daily. 02/12/23   [provider]                                                                                                                                     Past Surgical History Past Surgical History:  Procedure Laterality Date   AXILLARY LYMPH NODE DISSECTION Right 11/09/2020   Procedure: RIGHT AXILLARY LYMPH NODE DISSECTION;  Surgeon: Emelia Loron, MD;  Location: [facility];  Service: General;  Laterality: Right;   BIOPSY  07/01/2020   Procedure: BIOPSY;  Surgeon: Benancio Deeds, MD;  Location: [facility];  Service: Gastroenterology;;   BIOPSY  01/27/2021   Procedure: BIOPSY;  Surgeon: Benancio Deeds, MD;  Location: [facility];  Service: Gastroenterology;;   BIOPSY  09/07/2021   Procedure: BIOPSY;  Surgeon: Lynann Bologna, MD;  Location: [facility];  Service: Gastroenterology;;   BIOPSY  11/21/2022   Procedure: BIOPSY;  Surgeon: Lemar Lofty., MD;  Location: [facility];  Service: Gastroenterology;;   BREAST BIOPSY Right 03/24/2022   [place] RT BREAST BX W LOC DEV 1ST LESION IMG BX SPEC US GUIDE 03/24/2022 [facility]   BREAST BIOPSY  04/28/2022   [place] RT RADIOACTIVE SEED LOC 04/28/2022 [facility]   BREAST IMPLANT REMOVAL Right 08/07/2022   Procedure: REMOVAL  OF RIGHT BREAST IMPLANTS;  Surgeon: Allena Napoleon, MD;  Location: [facility];  Service: Plastics;  Laterality: Right;   BREAST RECONSTRUCTION WITH PLACEMENT OF TISSUE EXPANDER AND FLEX HD (ACELLULAR HYDRATED DERMIS) Bilateral 11/09/2020   Procedure: BILATERAL BREAST RECONSTRUCTION WITH PLACEMENT OF TISSUE EXPANDER AND FLEX HD (ACELLULAR HYDRATED DERMIS);  Surgeon: Allena Napoleon, MD;  Location: [facility];  Service: Plastics;  Laterality: Bilateral;   CAPSULOTOMY Bilateral 07/25/2021   Procedure: CAPSULOTOMY;  Surgeon: Allena Napoleon, MD;  Location: [facility];  Service: Plastics;  Laterality: Bilateral;   COLECTOMY  06/27/2019   COLONOSCOPY  2015   ESOPHAGOGASTRODUODENOSCOPY (EGD) WITH PROPOFOL N/A 11/21/2022   Procedure:  ESOPHAGOGASTRODUODENOSCOPY (EGD) WITH PROPOFOL;  Surgeon: Lemar Lofty., MD;  Location: [facility];  Service: Gastroenterology;  Laterality: N/A;   FLEXIBLE SIGMOIDOSCOPY N/A 07/01/2020   Procedure: FLEXIBLE SIGMOIDOSCOPY;  Surgeon: Benancio Deeds, MD;  Location: [facility];  Service: Gastroenterology;  Laterality: N/A;   FLEXIBLE SIGMOIDOSCOPY N/A 01/27/2021  Procedure: FLEXIBLE SIGMOIDOSCOPY;  Surgeon: Benancio Deeds, MD;  Location: [facility];  Service: Gastroenterology;  Laterality: N/A;   FLEXIBLE SIGMOIDOSCOPY N/A 09/07/2021   Procedure: FLEXIBLE SIGMOIDOSCOPY;  Surgeon: Lynann Bologna, MD;  Location: [facility];  Service: Gastroenterology;  Laterality: N/A;   FLEXIBLE SIGMOIDOSCOPY N/A 11/21/2022   Procedure: FLEXIBLE SIGMOIDOSCOPY;  Surgeon: Meridee Score Netty Starring., MD;  Location: [facility];  Service: Gastroenterology;  Laterality: N/A;   IR CATHETER TUBE CHANGE  03/07/2023   IR FLUORO GUIDE CV LINE RIGHT  01/09/2023   IR PATIENT EVAL TECH 0-60 MINS  04/24/2023   IR RADIOLOGIST EVAL & MGMT  04/13/2023   IR RADIOLOGIST EVAL & MGMT  05/07/2023   IR US GUIDE VASC ACCESS RIGHT  01/09/2023   LAPAROTOMY N/A 12/14/2022   Procedure: EXPLORATORY LAPAROTOMY WITH SMALL BOWEL RESECTION;  Surgeon: Moise Boring, MD;  Location: [facility];  Service: General;  Laterality: N/A;   LAPAROTOMY N/A 12/16/2022   Procedure: REOPENING LAPAROTOMY, ILEOSTOMY;  ABDOMINAL CLOSURE;  Surgeon: Moise Boring, MD;  Location: [facility];  Service: General;  Laterality: N/A;   NIPPLE SPARING MASTECTOMY Bilateral 11/09/2020   Procedure: BILATERAL NIPPLE SPARING MASTECTOMY;  Surgeon: Emelia Loron, MD;  Location: [facility];  Service: General;  Laterality: Bilateral;   PORT-A-CATH REMOVAL N/A 07/25/2021   Procedure: REMOVAL PORT-A-CATH;  Surgeon: Allena Napoleon, MD;  Location: [facility];  Service: Plastics;  Laterality: N/A;   PORT-A-CATH REMOVAL Left 01/05/2023    Procedure: REMOVAL PORT-A-CATH;  Surgeon: Abigail Miyamoto, MD;  Location: [facility];  Service: General;  Laterality: Left;   PORTACATH PLACEMENT N/A 06/23/2020   Procedure: INSERTION PORT-A-CATH;  Surgeon: Emelia Loron, MD;  Location: [facility];  Service: General;  Laterality: N/A;  START TIME OF 11:00 AM FOR 60 MINUTES [place] [facility]   PORTACATH PLACEMENT Left 05/02/2022   Procedure: INSERTION PORT-A-CATH;  Surgeon: Emelia Loron, MD;  Location: [facility];  Service: General;  Laterality: Left;   RADIOACTIVE SEED GUIDED EXCISIONAL BREAST BIOPSY Right 05/02/2022   Procedure: RADIOACTIVE SEED GUIDED RIGHT BREAST MASS EXCISION;  Surgeon: Emelia Loron, MD;  Location: [facility];  Service: General;  Laterality: Right;   REMOVAL OF BILATERAL TISSUE EXPANDERS WITH PLACEMENT OF BILATERAL BREAST IMPLANTS Bilateral 07/25/2021   Procedure: REMOVAL OF BILATERAL TISSUE EXPANDERS WITH PLACEMENT OF BILATERAL BREAST IMPLANTS;  Surgeon: Allena Napoleon, MD;  Location: [facility];  Service: Plastics;  Laterality: Bilateral;  1.5   Family History Family History  Problem Relation Age of Onset   Diabetes Father    Breast cancer Maternal Aunt        dx unknown age   Diabetes Paternal Aunt    Breast cancer Cousin        maternal cousin; dx unknown age   Esophageal cancer Neg Hx    Rectal cancer Neg Hx    Stomach cancer Neg Hx    Colon polyps Neg Hx    Colon cancer Neg Hx     Social History Social History   Tobacco Use   Smoking status: Former    Types: Cigarettes   Smokeless tobacco: Never  Vaping Use   Vaping status: Never Used  Substance Use Topics   Alcohol use: Not Currently    Alcohol/week: 1.0 - 2.0 standard drink of alcohol    Types: 1 - 2 Glasses of wine per week   Drug use: No   Allergies Nsaids, Chlorhexidine gluconate, Hydromorphone, Lactose,  and Lactose intolerance (gi)  Review of Systems Review of Systems   Constitutional:  Positive for fatigue.  Gastrointestinal:  Positive for nausea and vomiting.    Physical Exam Vital Signs  I have reviewed the triage vital signs BP 97/70   Pulse (!) 108   Temp 98.3 F (36.8 C) (Oral)   Resp (!) 23   Ht 5\' 4"  (1.626 m)   Wt 49 kg   LMP 04/24/2021   SpO2 99%   BMI 18.54 kg/m   Physical Exam Vitals and nursing note reviewed.  Constitutional:      General: She is not in acute distress.    Appearance: She is well-developed. She is ill-appearing.  HENT:     Head: Normocephalic and atraumatic.  Eyes:     Conjunctiva/sclera: Conjunctivae normal.  Cardiovascular:     Rate and Rhythm: Regular rhythm. Tachycardia present.     Heart sounds: No murmur heard. Pulmonary:     Effort: Pulmonary effort is normal. No respiratory distress.     Breath sounds: Normal breath sounds.  Abdominal:     Palpations: Abdomen is soft.     Tenderness: There is no abdominal tenderness.  Musculoskeletal:        General: No swelling.     Cervical back: Neck supple.  Skin:    General: Skin is warm and dry.     Capillary Refill: Capillary refill takes less than 2 seconds.  Neurological:     Mental Status: She is alert.  Psychiatric:        Mood and Affect: Mood normal.     ED Results and Treatments Labs (all labs ordered are listed, but only abnormal results are displayed) Labs Reviewed  COMPREHENSIVE METABOLIC PANEL - Abnormal; Notable for the following components:      Result Value   Sodium 131 (*)    Chloride 96 (*)    CO2 20 (*)    BUN 34 (*)    Creatinine, Ser 1.40 (*)    Total Protein 8.5 (*)    AST 88 (*)    ALT 111 (*)    Alkaline Phosphatase 294 (*)    Total Bilirubin 1.4 (*)    GFR, Estimated 47 (*)    All other components within normal limits  CBC WITH DIFFERENTIAL/PLATELET - Abnormal; Notable for the following components:   WBC 19.4 (*)    Hemoglobin 10.8 (*)    HCT 33.8 (*)    Neutro Abs 18.3 (*)    Lymphs Abs 0.5 (*)    Abs  Immature Granulocytes 0.13 (*)    All other components within normal limits  PROTIME-INR - Abnormal; Notable for the following components:   Prothrombin Time 15.6 (*)    All other components within normal limits  APTT - Abnormal; Notable for the following components:   aPTT 40 (*)    All other components within normal limits  CULTURE, BLOOD (ROUTINE X 2)  CULTURE, BLOOD (ROUTINE X 2)  FUNGUS CULTURE, BLOOD  RESP PANEL BY RT-PCR (RSV, FLU A&B, COVID)  RVPGX2  URINALYSIS, W/ REFLEX TO CULTURE (INFECTION SUSPECTED)  I-STAT CG4 LACTIC ACID, ED  Radiology CT ABDOMEN PELVIS W CONTRAST Result Date: 05/09/2023 CLINICAL DATA:  Provided history of sepsis. Patient reports weakness and chills. Radiologic records indicates history of metastatic breast cancer status post partial colectomy. EXAM: CT ABDOMEN AND PELVIS WITH CONTRAST TECHNIQUE: Multidetector CT imaging of the abdomen and pelvis was performed using the standard protocol following bolus administration of intravenous contrast. RADIATION DOSE REDUCTION: This exam was performed according to the departmental dose-optimization program which includes automated exposure control, adjustment of the mA and/or kV according to patient size and/or use of iterative reconstruction technique. CONTRAST:  OMNIPAQUE IOHEXOL 300 MG/ML  SOLN COMPARISON:  CT 04/13/2023, abscess injection 2 days ago reviewed FINDINGS: Lower chest: Scarring at the left lung base. Hepatobiliary: No focal liver abnormality. Possible small gallstone series 2, image 30. No abnormal gallbladder distention or pericholecystic inflammation. Pancreas: No ductal dilatation or inflammation. Spleen: Normal in size without focal abnormality. Adrenals/Urinary Tract: No adrenal nodule. Slight bilateral renal collecting system prominence, left greater than right. Equivocal  enhancement of the left renal pelvis. No renal calculi. Unremarkable urinary bladder, no bladder wall thickening. Stomach/Bowel: Bowel assessment is limited in the absence of enteric contrast and paucity of intra-abdominal fat. Decompressed stomach. Subtotal colectomy. Small amount of fluid within the rectum, previous contrast in the rectum has cleared. Left abdominal ileostomy. Suggestion of mild diffuse small bowel wall thickening. No bowel pneumatosis. No abnormal distension or obstruction. Vascular/Lymphatic: Normal caliber abdominal aorta. Patent portal and splenic veins. Narrowing of the left renal vein as it courses under the SMA is again seen. No gross abdominopelvic adenopathy. Reproductive: Again seen uterine fibroids. Small amount of free fluid in the right adnexa/pelvis is unchanged from prior exam. Other: Small amount of stranding in the right mid abdomen, similar to prior. Small amount of free fluid in the right pelvis and adnexa which was present on prior exam. No free air. Musculoskeletal: No free air, free fluid, or intra-abdominal fluid collection. Left breast implant. Right mastectomy. IMPRESSION: 1. Suggestion of mild diffuse small bowel wall thickening which can be seen with enteritis. No obstruction. 2. Left-sided ileostomy post colectomy. 3. Small amount of free fluid in the right pelvis is well as adnexa, unchanged from prior exam. 4. Chronic uterine fibroids. Electronically Signed   By: Narda Rutherford M.D.   On: 05/09/2023 21:40   DG Chest Port 1 View Result Date: 05/09/2023 CLINICAL DATA:  Hypertension, weakness, chills with nausea and vomiting, concern for sepsis EXAM: PORTABLE CHEST 1 VIEW COMPARISON:  02/11/2023 FINDINGS: Normal heart size and vascularity. Left breast implant noted over the left hemithorax. Lungs remain clear. No focal pneumonia, collapse or consolidation. Negative for edema, large effusion or pneumothorax. Trachea midline. Right axillary clips present. Right IJ  tunneled PICC line tip SVC RA junction. Overall stable exam. IMPRESSION: No acute chest process. Electronically Signed   By: Judie Petit.  Shick M.D.   On: 05/09/2023 16:45    Pertinent labs & imaging results that were available during my care of the patient were reviewed by me and considered in my medical decision making (see MDM for details).  Medications Ordered in ED Medications  vancomycin (VANCOCIN) IVPB 1000 mg/200 mL premix (has no administration in time range)  ceFEPIme (MAXIPIME) 2 g in sodium chloride 0.9 % 100 mL IVPB (has no administration in time range)  norepinephrine (LEVOPHED) 4mg  in (0.016 mg/mL) premix infusion (2 mcg/min Intravenous New Bag/Given 05/09/23 2037)  lactated ringers bolus 1,000 mL (0 mLs Intravenous Stopped 05/09/23 1932)  lactated ringers bolus 1,000  mL (0 mLs Intravenous Stopped 05/09/23 2032)  albumin human 25 % solution 25 g (0 g Intravenous Stopped 05/09/23 1931)  iohexol (OMNIPAQUE) 300 MG/ML solution 100 mL (100 mLs Intravenous Contrast Given 05/09/23 2013)                                                                                                                                     Procedures .Critical Care  Performed by: Glendora Score, MD Authorized by: Glendora Score, MD   Critical care provider statement:    Critical care time (minutes):  75   Critical care was necessary to treat or prevent imminent or life-threatening deterioration of the following conditions:  Dehydration and shock   Critical care was time spent personally by me on the following activities:  Development of treatment plan with patient or surrogate, discussions with consultants, evaluation of patient's response to treatment, examination of patient, ordering and review of laboratory studies, ordering and review of radiographic studies, ordering and performing treatments and interventions, pulse oximetry, re-evaluation of patient's condition and review of old charts   (including  critical care time)  Medical Decision Making / ED Course   This patient presents to the ED for concern of rapid heart rate, hypotension, this involves an extensive number of treatment options, and is a complaint that carries with it a high risk of complications and morbidity.  The differential diagnosis includes bacterial, sepsis, hypovolemic shock, obstructive shock, intra-abdominal infection, obstruction  MDM: Patient's emergency room for evaluation of rapid heart rate and hypotension.  Physical exam largely unremarkable with an appropriately granulating surgical wound near the umbilicus, no significant abdominal tenderness to palpation.  Laboratory evaluation concerning with a leukocytosis to 19.4 with a neutrophilic predominance, AST 88, ALT 111, total bili 1.4, alk phos 294, BUN 34, creatinine 1.4, sodium 131.  Urinalysis unremarkable.  CT abdomen pelvis with no obvious source of underlying sepsis.  Aggressive fluid resuscitation as well as albumin supplementation given and patient continued to be hypotensive but tachycardia started to improve.  Required low-dose Levophed initiation to maintain maps greater than 65.  Initially patient did have some hesitance to starting antibiotics but after extensive discussion she did agree to broad-spectrum antibiotics.  She is at high risk for bacteremia given TPN use.  Spoke with Dr. Gaynell Face of critical care who will admit the patient to the ICU.  Patient admitted   Additional history obtained:  -External records from outside source obtained and reviewed including: Chart review including previous notes, labs, imaging, consultation notes   Lab Tests: -I ordered, reviewed, and interpreted labs.   The pertinent results include:   Labs Reviewed  COMPREHENSIVE METABOLIC PANEL - Abnormal; Notable for the following components:      Result Value   Sodium 131 (*)    Chloride 96 (*)    CO2 20 (*)    BUN 34 (*)    Creatinine, Ser 1.40 (*)  Total Protein  8.5 (*)    AST 88 (*)    ALT 111 (*)    Alkaline Phosphatase 294 (*)    Total Bilirubin 1.4 (*)    GFR, Estimated 47 (*)    All other components within normal limits  CBC WITH DIFFERENTIAL/PLATELET - Abnormal; Notable for the following components:   WBC 19.4 (*)    Hemoglobin 10.8 (*)    HCT 33.8 (*)    Neutro Abs 18.3 (*)    Lymphs Abs 0.5 (*)    Abs Immature Granulocytes 0.13 (*)    All other components within normal limits  PROTIME-INR - Abnormal; Notable for the following components:   Prothrombin Time 15.6 (*)    All other components within normal limits  APTT - Abnormal; Notable for the following components:   aPTT 40 (*)    All other components within normal limits  CULTURE, BLOOD (ROUTINE X 2)  CULTURE, BLOOD (ROUTINE X 2)  FUNGUS CULTURE, BLOOD  RESP PANEL BY RT-PCR (RSV, FLU A&B, COVID)  RVPGX2  URINALYSIS, W/ REFLEX TO CULTURE (INFECTION SUSPECTED)  I-STAT CG4 LACTIC ACID, ED      EKG   EKG Interpretation Date/Time:  Wednesday May 09 2023 15:55:51 EST Ventricular Rate:  150 PR Interval:  79 QRS Duration:  61 QT Interval:  331 QTC Calculation: 523 R Axis:   57  Text Interpretation: Sinus tachycardia Confirmed by Tevis Conger ([id number]) on 05/09/2023 4:14:49 PM         Imaging Studies ordered: I ordered imaging studies including CT abdomen pelvis, chest x-ray I independently visualized and interpreted imaging. I agree with the radiologist interpretation   Medicines ordered and prescription drug management: Meds ordered this encounter  Medications   lactated ringers bolus 1,000 mL   vancomycin (VANCOCIN) IVPB 1000 mg/200 mL premix    Indication::   Sepsis   ceFEPIme (MAXIPIME) 2 g in sodium chloride 0.9 % 100 mL IVPB    Antibiotic Indication::   Sepsis   lactated ringers bolus 1,000 mL   albumin human 25 % solution 25 g   iohexol (OMNIPAQUE) 300 MG/ML solution 100 mL   norepinephrine (LEVOPHED) 4mg  in (0.016 mg/mL) premix infusion    -I  have reviewed the patients home medicines and have made adjustments as needed  Critical interventions Fluid resuscitation, albumin, antibiotics, Levophed    Cardiac Monitoring: The patient was maintained on a cardiac monitor.  I personally viewed and interpreted the cardiac monitored which showed an underlying rhythm of: Sinus tachycardia  Social Determinants of Health:  Factors impacting patients care include: none   Reevaluation: After the interventions noted above, I reevaluated the patient and found that they have :improved  Co morbidities that complicate the patient evaluation  Past Medical History:  Diagnosis Date   Breast cancer (HCC)    right   Colon stricture (HCC) 05/07/2019   Colonic stricture (HCC) 11/19/2022   Crohn's colitis, with intestinal obstruction (HCC) 09/17/2013   Dx 2006 - right and left colon involved 2011 - pan-colitis 09/17/2013 left colitis 60-20 cm March 2021 subtotal colectomy ileosigmoid anastomosis for colonic strictures-Dr. Maisie Fus     Crohn's disease [facility])    Exacerbation of Crohn's disease of large intestine (HCC) 12/13/2022   Family history of breast cancer 06/11/2020   History of abdominal subtotal colectomy 11/19/2022   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 10/17/2010   Rectal bleeding    Uterine fibroid    Vitamin D deficiency  09/24/2013      Dispostion: I considered admission for this patient, and patient require hospital admission for hypotension requiring Levophed and concern for bacteremia.     Final Clinical Impression(s) / ED Diagnoses Final diagnoses:  Hypotension, unspecified hypotension type  Leukocytosis, unspecified type     @PCDICTATION @    Glendora Score, MD 05/09/23 2202

## 2023-05-10 DIAGNOSIS — I959 Hypotension, unspecified: Secondary | ICD-10-CM | POA: Diagnosis not present

## 2023-05-10 DIAGNOSIS — Z87898 Personal history of other specified conditions: Secondary | ICD-10-CM

## 2023-05-10 DIAGNOSIS — R6521 Severe sepsis with septic shock: Secondary | ICD-10-CM | POA: Diagnosis not present

## 2023-05-10 DIAGNOSIS — K5090 Crohn's disease, unspecified, without complications: Secondary | ICD-10-CM

## 2023-05-10 DIAGNOSIS — A419 Sepsis, unspecified organism: Secondary | ICD-10-CM

## 2023-05-10 DIAGNOSIS — E861 Hypovolemia: Secondary | ICD-10-CM

## 2023-05-10 DIAGNOSIS — J101 Influenza due to other identified influenza virus with other respiratory manifestations: Principal | ICD-10-CM

## 2023-05-10 DIAGNOSIS — K50918 Crohn's disease, unspecified, with other complication: Secondary | ICD-10-CM

## 2023-05-10 DIAGNOSIS — D72829 Elevated white blood cell count, unspecified: Secondary | ICD-10-CM | POA: Diagnosis not present

## 2023-05-10 DIAGNOSIS — R7881 Bacteremia: Secondary | ICD-10-CM

## 2023-05-10 DIAGNOSIS — R634 Abnormal weight loss: Secondary | ICD-10-CM

## 2023-05-10 DIAGNOSIS — Z789 Other specified health status: Secondary | ICD-10-CM

## 2023-05-10 LAB — RESPIRATORY PANEL BY PCR

## 2023-05-10 LAB — BLOOD CULTURE ID PANEL (REFLEXED) - BCID2
A.calcoaceticus-baumannii: NOT DETECTED
A.calcoaceticus-baumannii: NOT DETECTED
Bacteroides fragilis: NOT DETECTED
Bacteroides fragilis: NOT DETECTED
Candida albicans: NOT DETECTED
Candida albicans: NOT DETECTED
Candida auris: NOT DETECTED
Candida auris: NOT DETECTED
Candida glabrata: NOT DETECTED
Candida glabrata: NOT DETECTED
Candida krusei: NOT DETECTED
Candida krusei: NOT DETECTED
Candida parapsilosis: NOT DETECTED
Candida parapsilosis: NOT DETECTED
Candida tropicalis: NOT DETECTED
Candida tropicalis: NOT DETECTED
Cryptococcus neoformans/gattii: NOT DETECTED
Cryptococcus neoformans/gattii: NOT DETECTED
Enterobacter cloacae complex: NOT DETECTED
Enterobacter cloacae complex: NOT DETECTED
Enterobacterales: NOT DETECTED
Enterobacterales: NOT DETECTED
Enterococcus Faecium: NOT DETECTED
Enterococcus Faecium: NOT DETECTED
Enterococcus faecalis: NOT DETECTED
Enterococcus faecalis: NOT DETECTED
Escherichia coli: NOT DETECTED
Escherichia coli: NOT DETECTED
Haemophilus influenzae: NOT DETECTED
Haemophilus influenzae: NOT DETECTED
Klebsiella aerogenes: NOT DETECTED
Klebsiella aerogenes: NOT DETECTED
Klebsiella oxytoca: NOT DETECTED
Klebsiella oxytoca: NOT DETECTED
Klebsiella pneumoniae: NOT DETECTED
Klebsiella pneumoniae: NOT DETECTED
Listeria monocytogenes: NOT DETECTED
Listeria monocytogenes: NOT DETECTED
Methicillin resistance mecA/C: DETECTED — AB
Neisseria meningitidis: NOT DETECTED
Neisseria meningitidis: NOT DETECTED
Proteus species: NOT DETECTED
Proteus species: NOT DETECTED
Pseudomonas aeruginosa: NOT DETECTED
Pseudomonas aeruginosa: NOT DETECTED
Salmonella species: NOT DETECTED
Salmonella species: NOT DETECTED
Serratia marcescens: NOT DETECTED
Serratia marcescens: NOT DETECTED
Staphylococcus aureus (BCID): NOT DETECTED
Staphylococcus aureus (BCID): NOT DETECTED
Staphylococcus epidermidis: DETECTED — AB
Staphylococcus epidermidis: NOT DETECTED
Staphylococcus lugdunensis: NOT DETECTED
Staphylococcus lugdunensis: NOT DETECTED
Staphylococcus species: DETECTED — AB
Staphylococcus species: NOT DETECTED
Stenotrophomonas maltophilia: NOT DETECTED
Stenotrophomonas maltophilia: NOT DETECTED
Streptococcus agalactiae: NOT DETECTED
Streptococcus agalactiae: NOT DETECTED
Streptococcus pneumoniae: NOT DETECTED
Streptococcus pneumoniae: NOT DETECTED
Streptococcus pyogenes: NOT DETECTED
Streptococcus pyogenes: NOT DETECTED
Streptococcus species: NOT DETECTED
Streptococcus species: DETECTED — AB

## 2023-05-10 LAB — CBC
HCT: 29.1 % — ABNORMAL LOW (ref 36.0–46.0)
Hemoglobin: 9.3 g/dL — ABNORMAL LOW (ref 12.0–15.0)
MCH: 27.2 pg (ref 26.0–34.0)
MCHC: 32 g/dL (ref 30.0–36.0)
MCV: 85.1 fL (ref 80.0–100.0)
Platelets: 258 10*3/uL (ref 150–400)
RBC: 3.42 MIL/uL — ABNORMAL LOW (ref 3.87–5.11)
RDW: 13.7 % (ref 11.5–15.5)
WBC: 24 10*3/uL — ABNORMAL HIGH (ref 4.0–10.5)
nRBC: 0 % (ref 0.0–0.2)

## 2023-05-10 LAB — GASTROINTESTINAL PANEL BY PCR, STOOL (REPLACES STOOL CULTURE)

## 2023-05-10 LAB — GAMMA GT: GGT: 95 U/L — ABNORMAL HIGH (ref 7–50)

## 2023-05-10 LAB — RESP PANEL BY RT-PCR (RSV, FLU A&B, COVID)  RVPGX2
Influenza A by PCR: POSITIVE — AB
Influenza B by PCR: NEGATIVE
Resp Syncytial Virus by PCR: NEGATIVE
SARS Coronavirus 2 by RT PCR: NEGATIVE

## 2023-05-10 LAB — MRSA NEXT GEN BY PCR, NASAL: MRSA by PCR Next Gen: DETECTED — AB

## 2023-05-10 LAB — CREATININE, SERUM
Creatinine, Ser: 1.22 mg/dL — ABNORMAL HIGH (ref 0.44–1.00)
GFR, Estimated: 56 mL/min — ABNORMAL LOW (ref >60)

## 2023-05-10 LAB — CORTISOL: Cortisol, Plasma: 7.6 ug/dL

## 2023-05-10 MED ORDER — METRONIDAZOLE 500 MG/100ML IV SOLN
500.0000 mg | Freq: Two times a day (BID) | INTRAVENOUS | Status: DC
  Administered 2023-05-10: 500 mg via INTRAVENOUS
  Filled 2023-05-10: qty 100

## 2023-05-10 MED ORDER — OSELTAMIVIR PHOSPHATE 75 MG PO CAPS
75.0000 mg | ORAL_CAPSULE | Freq: Once | ORAL | Status: AC
  Administered 2023-05-10: 75 mg via ORAL
  Filled 2023-05-10: qty 1

## 2023-05-10 MED ORDER — SODIUM CHLORIDE 0.9 % IV SOLN
2.0000 g | Freq: Two times a day (BID) | INTRAVENOUS | Status: DC
  Administered 2023-05-10: 2 g via INTRAVENOUS
  Filled 2023-05-10: qty 12.5

## 2023-05-10 MED ORDER — LACTATED RINGERS IV BOLUS
1000.0000 mL | Freq: Once | INTRAVENOUS | Status: AC
  Administered 2023-05-10: 1000 mL via INTRAVENOUS

## 2023-05-10 MED ORDER — OSELTAMIVIR PHOSPHATE 30 MG PO CAPS
30.0000 mg | ORAL_CAPSULE | Freq: Two times a day (BID) | ORAL | Status: DC
  Administered 2023-05-10 – 2023-05-12 (×4): 30 mg via ORAL
  Filled 2023-05-10 (×4): qty 1

## 2023-05-10 MED ORDER — LINEZOLID 600 MG/300ML IV SOLN
600.0000 mg | Freq: Two times a day (BID) | INTRAVENOUS | Status: DC
  Administered 2023-05-10: 600 mg via INTRAVENOUS
  Filled 2023-05-10: qty 300

## 2023-05-10 MED ORDER — OSELTAMIVIR PHOSPHATE 75 MG PO CAPS: 75.0000 mg | ORAL_CAPSULE | Freq: Two times a day (BID) | ORAL | Status: DC

## 2023-05-10 MED ORDER — DAPTOMYCIN-SODIUM CHLORIDE 500-0.9 MG/50ML-% IV SOLN
500.0000 mg | Freq: Every day | INTRAVENOUS | Status: DC
Start: 2023-05-10 — End: 2023-05-15
  Administered 2023-05-10 – 2023-05-14 (×5): 500 mg via INTRAVENOUS
  Filled 2023-05-10 (×5): qty 50

## 2023-05-10 MED ORDER — LACTATED RINGERS IV SOLN: INTRAVENOUS | Status: AC

## 2023-05-10 MED ORDER — ORAL CARE MOUTH RINSE: 15.0000 mL | OROMUCOSAL | Status: DC | PRN

## 2023-05-10 MED ORDER — APIXABAN 5 MG PO TABS
5.0000 mg | ORAL_TABLET | Freq: Two times a day (BID) | ORAL | Status: DC
  Administered 2023-05-10 – 2023-05-17 (×14): 5 mg via ORAL
  Filled 2023-05-10 (×14): qty 1

## 2023-05-10 MED ORDER — MUPIROCIN 2 % EX OINT
1.0000 | TOPICAL_OINTMENT | Freq: Two times a day (BID) | CUTANEOUS | Status: AC
  Administered 2023-05-11 – 2023-05-15 (×10): 1 via NASAL
  Filled 2023-05-10 (×5): qty 22

## 2023-05-10 NOTE — Progress Notes (Signed)
NAME:  [name], MRN:  [id number], DOB:  Sep 22, 1975, LOS: 1 ADMISSION DATE:  05/09/2023, CONSULTATION DATE:  05/09/23 REFERRING MD:  EDP, CHIEF COMPLAINT:  shock   History of Present Illness:  48 yo with complicated pmh presented today with weakness, chills, n/v that started today. Pt states that she has been in her normal state of health with exception of a uri over past few days. She states that despite this she has been eating and drinking as normal as well as tolerating her tpn. She denies fevers, states that today she had sudden onset of chills and vomited x1 but no change in stool output from her baseline. She states that if she did not feel so weak she would not have come to ed.   Upon presentation she was noted to be hypotensive, unresponsive to ivf as well as albumin infusion. She was cultured and started on iv abx after multiple conversations encouraging coverage for presumed sepsis in light of her inflammed small bowel noted on imaging as well as known tpn use, concern for fungal infection. She was also tachycardic to 160's but states that she has this regularly. After fluids her hr returned to 100-110 but BP ultimately req vasopressor initiation.   She is pending rvp. For now wil cont with abx and antifungal until cx return.   Ccm was asked to admit 2/2 need for low dose pressors.   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 rate 16, height 5\' 4"  (1.626 m), weight 47.5 kg, last menstrual period  04/24/2021, SpO2 98%.        Intake/Output Summary (Last 24 hours) at 05/10/2023 7829 Last data filed at 05/10/2023 5621 Gross per 24 hour  Intake 2650 ml  Output --  Net 2650 ml   Filed Weights   05/09/23 1550 05/10/23 0105  Weight: 49 kg 47.5 kg    Examination: General: thin, rather cachectic female laying supine comfortably in bed, nad HENT: ncat, eomi, perrla, mmmp Lungs: ctab Cardiovascular: tachy but regular, no m/g/r Abdomen: soft, ostomy LLQ, nt,nd bs + but diminished Extremities: no c/c/e Neuro: no focal deficits GU: deferred  Labs: Blood cultures positive for strep and staph epi  Resolved Hospital Problem list     Assessment & Plan:  Shock, undifferentiated Presumed sepsis vs hypovolemia, undetermined etiology suspected enteritis with small bowel inflammation on imaging vs bacteremia  Influenza A infection H/o Chron's disease s/p subtotal colectomy Recurrent sbo"s 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 AKI Transaminitis Elevated alk phos Leukocytosis Chronic anemia Mild Hyponatremia  Plan -titrate vasopressors to map >65 - check random cortisol - Give 1L LR bolus this AM - Blood cultures with strep and staph epi growing - ID consulted for further evaluation given PICC line, TPN and bacteremia - Continue linezolid, cefepime, flagyl and fluconazole - Await ID recs - Check Echo given bacteremia - She is refusing Tamiflu - resume TPN - regular diet   Best Practice (right click and "Reselect all SmartList Selections" daily)   Diet/type: Regular consistency (see orders) and TPN DVT prophylaxis  DOAC Pressure ulcer(s): N/A GI prophylaxis: N/A Lines: cvc in place for chronic tpn Foley:  N/A Code Status:  full code Last date of multidisciplinary goals of care discussion [full code per discussion with pt]  Labs   CBC: Recent Labs  Lab 05/09/23 1625 05/09/23 2245  WBC 19.4* 24.0*  NEUTROABS 18.3*  --   HGB 10.8* 9.3*  HCT 33.8* 29.1*  MCV 84.9  85.1  PLT 255 258    Basic Metabolic Panel: Recent Labs  Lab 05/09/23 1625 05/09/23 2245  NA 131*  --   K 3.6  --   CL 96*  --   CO2 20*  --   GLUCOSE 97  --   BUN 34*  --   CREATININE 1.40* 1.22*  CALCIUM 9.1  --    GFR: Estimated Creatinine Clearance: 43.7 mL/min (A) (by C-G formula based on SCr of 1.22 mg/dL (H)). Recent Labs  Lab 05/09/23 1625 05/09/23 1635 05/09/23 2245  WBC 19.4*  --  24.0*  LATICACIDVEN  --  1.0  --     Liver Function Tests: Recent Labs  Lab 05/09/23 1625  AST 88*  ALT 111*  ALKPHOS 294*  BILITOT 1.4*  PROT 8.5*  ALBUMIN 3.5   No results for input(s): "LIPASE", "AMYLASE" in the last 168 hours. No results for input(s): "AMMONIA" in the last 168 hours.  ABG    Component Value Date/Time   PHART 7.47 (H) 12/18/2022 0328   PCO2ART 29 (L) 12/18/2022 0328   PO2ART 121 (H) 12/18/2022 0328   HCO3 21.3 12/18/2022 0328   TCO2 25 12/13/2022 0818   ACIDBASEDEF 1.5 12/18/2022 0328   O2SAT 95.9 12/18/2022 0328     Coagulation Profile: Recent Labs  Lab 05/09/23 1625  INR 1.2    Cardiac Enzymes: No results for input(s): "CKTOTAL", "CKMB", "CKMBINDEX", "TROPONINI" in the last 168 hours.  HbA1C: Hgb A1c MFr Bld  Date/Time Value Ref Range Status  12/13/2022 06:45 AM 5.7 (H) 4.8 - 5.6 % Final    Comment:    (NOTE) Pre diabetes:          5.7%-6.4%  Diabetes:              >6.4%  Glycemic control for   <7.0% adults with diabetes     CBG: No results for input(s): "GLUCAP" in the last 168 hours.     Critical care time: 35 min

## 2023-05-10 NOTE — Progress Notes (Addendum)
Pharmacy Antibiotic Note  [name] is a 48 y.o. female admitted on 05/09/2023 with sepsis with suspected enteritis with small bowel inflammation.  PMH significant for breast cancer, Crohn's disease.  Pharmacy has been consulted for Zosyn and Diflucan dosing.  1/30 5am:  Zosyn changed to Cefepime + Metronidazole  Plan: Zosyn d/c'ed Cefepime 2gm IV q12h Metronidazole 500mg  IV q12h Diflucan 400mg  IV x 1 followed by 200mg  IV q24h Follow renal function F/u culture results and sensitivities  Height: 5\' 4"  (162.6 cm) Weight: 47.5 kg (104 lb 11.5 oz) IBW/kg (Calculated) : 54.7  Temp (24hrs), Avg:98.3 F (36.8 C), Min:98.1 F (36.7 C), Max:98.5 F (36.9 C)  Recent Labs  Lab 05/09/23 1625 05/09/23 1635 05/09/23 2245  WBC 19.4*  --  24.0*  CREATININE 1.40*  --  1.22*  LATICACIDVEN  --  1.0  --     Estimated Creatinine Clearance: 43.7 mL/min (A) (by C-G formula based on SCr of 1.22 mg/dL (H)).    Allergies  Allergen Reactions   Nsaids Other (See Comments)    Crohns disease/ IBD   Vancomycin Itching   Chlorhexidine Gluconate Itching    Able to use CHG swabs for ports, reaction with direct skin contact   Hydromorphone Other (See Comments)    Confusion/sedation   Lactose Other (See Comments)    Upset stomach/diarrhea   Lactose Intolerance (Gi) Other (See Comments)    Upset stomach/diarrhea    Antimicrobials this admission: 1/29 Vancomycin x 1 1/29 Cefepime >> 1/29 Diflucan >> 1/30 Metronidazole >>  Dose adjustments this admission:    Microbiology results: 1/29 BCx:   1/29 Fungus Cx:     Thank you for allowing pharmacy to be a part of this patient's care.  Maryellen Pile, PharmD 05/10/2023 5:03 AM

## 2023-05-10 NOTE — Plan of Care (Signed)
  Problem: Clinical Measurements: Goal: Cardiovascular complication will be avoided Outcome: Progressing   Problem: Activity: Goal: Risk for activity intolerance will decrease Outcome: Progressing   Problem: Coping: Goal: Level of anxiety will decrease Outcome: Progressing   Problem: Elimination: Goal: Will not experience complications related to urinary retention Outcome: Progressing   Problem: Pain Managment: Goal: General experience of comfort will improve and/or be controlled Outcome: Progressing   Problem: Safety: Goal: Ability to remain free from injury will improve Outcome: Progressing

## 2023-05-10 NOTE — Progress Notes (Signed)
eLink Physician-Brief Progress Note Patient Name: [name] DOB: Apr 01, 1976 MRN: [id number]   Date of Service  05/10/2023  HPI/Events of Note  Patient admitted with gastrointestinal symptoms, hypotension presumed secondary to evolving sepsis in the context of a history of severe Crohn's disease s/p total colectomy in the past, and breast cancer s/p treatment in the past. She is on chronic TPN which she has not received in > 24 hours so volume depletion may also be a factor, patient tested positive for Influenza A infection.  eICU Interventions  New Patient Evaluation.        Thomasene Lot Monserath Neff 05/10/2023, 1:41 AM

## 2023-05-10 NOTE — Progress Notes (Addendum)
PHARMACY - PHYSICIAN COMMUNICATION CRITICAL VALUE ALERT - BLOOD CULTURE IDENTIFICATION (BCID)  [name] is an 48 y.o. female who presented to [facility] on 05/09/2023 with a chief complaint of weakness, chills, N/V.  Assessment:  Pt on broad spectrum antibiotics for sepsis, unknown source. Noted to be on chronic TPN. Influenza+ BCID + 1/4 MRSE  Name of physician (or Provider) Contacted: Dr. Francine Graven  Current antibiotics: Cefepime + fluconazole + metronidazole  Changes to prescribed antibiotics: Adding linezolid 600 mg IV q12h  Results for orders placed or performed during the hospital encounter of 05/09/23  Blood Culture ID Panel (Reflexed) (Collected: 05/09/2023  4:25 PM)  Result Value Ref Range   Enterococcus faecalis NOT DETECTED NOT DETECTED   Enterococcus Faecium NOT DETECTED NOT DETECTED   Listeria monocytogenes NOT DETECTED NOT DETECTED   Staphylococcus species DETECTED (A) NOT DETECTED   Staphylococcus aureus (BCID) NOT DETECTED NOT DETECTED   Staphylococcus epidermidis DETECTED (A) NOT DETECTED   Staphylococcus lugdunensis NOT DETECTED NOT DETECTED   Streptococcus species NOT DETECTED NOT DETECTED   Streptococcus agalactiae NOT DETECTED NOT DETECTED   Streptococcus pneumoniae NOT DETECTED NOT DETECTED   Streptococcus pyogenes NOT DETECTED NOT DETECTED   A.calcoaceticus-baumannii NOT DETECTED NOT DETECTED   Bacteroides fragilis NOT DETECTED NOT DETECTED   Enterobacterales NOT DETECTED NOT DETECTED   Enterobacter cloacae complex NOT DETECTED NOT DETECTED   Escherichia coli NOT DETECTED NOT DETECTED   Klebsiella aerogenes NOT DETECTED NOT DETECTED   Klebsiella oxytoca NOT DETECTED NOT DETECTED   Klebsiella pneumoniae NOT DETECTED NOT DETECTED   Proteus species NOT DETECTED NOT DETECTED   Salmonella species NOT DETECTED NOT DETECTED   Serratia marcescens NOT DETECTED NOT DETECTED   Haemophilus influenzae NOT DETECTED NOT DETECTED   Neisseria meningitidis NOT DETECTED  NOT DETECTED   Pseudomonas aeruginosa NOT DETECTED NOT DETECTED   Stenotrophomonas maltophilia NOT DETECTED NOT DETECTED   Candida albicans NOT DETECTED NOT DETECTED   Candida auris NOT DETECTED NOT DETECTED   Candida glabrata NOT DETECTED NOT DETECTED   Candida krusei NOT DETECTED NOT DETECTED   Candida parapsilosis NOT DETECTED NOT DETECTED   Candida tropicalis NOT DETECTED NOT DETECTED   Cryptococcus neoformans/gattii NOT DETECTED NOT DETECTED   Methicillin resistance mecA/C DETECTED (A) NOT DETECTED    Cindi Carbon, PharmD 05/10/2023  7:51 AM  Addendum: Second BCID call received reporting 1/2 +Strep species from peripheral site. Dr. Francine Graven notified. No changes to antibiotics at this time.   Cindi Carbon, PharmD 05/10/23 1:46 PM

## 2023-05-10 NOTE — Plan of Care (Signed)

## 2023-05-10 NOTE — Consult Note (Signed)
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 Large amount of weight loss since September when she was ill and history of apparently lower blood pressure since then Itchiness and erythema while having been given vancomycin 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  Plan:  Continue Tamiflu F/u 20 pathogen RVP for influenza type--if it is not a seasonal variant, sample will have to be sent to the [facility] and airborne precautions instituted F/u her blood culture data in terms of what grows phenotypically Continue daptomycin for now to cover the strep species and Staph epi Ensure she has been screened for viral hepatides and HIV   Principal Problem:   Septic shock (HCC) Active Problems:   Hypotension   Scheduled Meds:  apixaban  5 mg Oral BID   oseltamivir  30 mg Oral BID   sodium chloride flush  10-40 mL Intracatheter Q12H   Continuous Infusions:  DAPTOmycin Stopped (05/10/23 1528)   lactated ringers Stopped (05/10/23 1100)   norepinephrine (LEVOPHED) Adult infusion 2 mcg/min (05/10/23 1543)   PRN Meds:.docusate sodium, ondansetron (ZOFRAN) IV, mouth rinse, polyethylene glycol, sodium chloride flush  HPI: [name] is a 48 y.o. female with a past medical history significant for breast cancer, Crohn's disease and protracted hospitalization in September when she was admitted with a small bowel obstruction requiring bowel resection due to ischemia then followed by exploratory  laparotomy with ileostomy and abdominal wound closure on the seventh complicated by the development of intra-abdominal abscesses that were have been drained and had drains for some period of time as outlined in my partner Dr. Doristine Church notes.  Along the way the patient had trouble tolerating oral ciprofloxacin and metronidazole that she was being given empirically for intra-abdominal infection.  She was seen by Dr. Thedore Mins who then offered to switch her to Augmentin.  She was to take the Augmentin till the drain was removed.  In talking to her today she tells me the Augmentin also made her throw up and that she did not take it.  In the interim she did have evaluation by interventional radiology where they found that her intra-abdominal abscesses fluid collections had completely resolved and her drain was indeed removed on January 27th.  She does of note suffered currently from short gut syndrome and has been on TPN since September through a central line.  In the ensuing days she had been doing relatively well but then had developed a bit of a cough which she associated with a "cold" that her son had developed.  She then had rigors chills and nausea with posttussive emesis.  She says that after she threw up she actually felt better.  However her home health nurse was visiting her took her vital signs and noted that her pulse was in the 160s and directed her to come to the emergency department.  In the ER she was initially hypotensive and her hypotensive and shin and presumed shock did  not respond to initial intravenous fluid resuscitation as well as albumin infusion.  Urine was analyzed though she was not complaining of urinary complaints. She had some WBC clumps in urine and a culture may have been sent but she does not have a UTI.  Blood cultures were taken which have grown different species 1 with a Staphylococcus epidermidis that is methicillin resistant and the other with a streptococcal species  which sounds as if it will end up being one of the organisms that tends inhabit the oropharynx namely a viridans group type of streptococcal species or Streptococcus mitis oralis.  She had CT abdomen pelvis performed which showed some diffuse small bowel thickening but no obstructive pathology with the presence of a left-sided ileostomy and colectomy with small amount of free fluid in the right pelvis and some chronic uterine fibroids.  There were no abscess to have her seen.  She had been placed initially on quite broad-spectrum antibiotics form of vancomycin and cefepime metronidazole and fluconazole.  She had what sounds like a red man reaction to vancomycin and ultimately Zyvox was added after the vancomycin was stopped her influenza A came back positive and she was started on Tamiflu though it is my understanding she has not been taking all of the doses that have been offered to her.  She was transferred the ICU due to the need for low-dose pressors currently on 1 mcg of Levophed.  We are consulted to assist in workup of her septic picture.  And going over her case I would interpret her blood culture results to be highly suggestive of separate contamination with 2 different organisms at 2 different sites.  That being said we will leave her on some daptomycin for coverage of these organisms.  Certainly with her extensive intra-abdominal history and history of abscesses and drain having been recently removed from there 1 would have expected that to be a potential source of infection but the CT abdomen pelvis is really benign.  She did not have much in the way of infiltrates on chest x-ray that she does have influenza so she certainly is at risk of developing more pulmonary symptoms and of potential bacterial superinfection of the lungs but currently I do not see much evidence for that at present.  For now I would recommend continue her Tamiflu and continue daptomycin until we get more data back  on her blood cultures.  Given that she does not have evidence of a true bacteremia I would not feel the need to have her PICC line removed certainly if you are having ongoing fevers without explanation and hypotension that did not improve then removal of PICC line would be a reasonable diagnostic and therapeutic maneuver.  In talking to the patient to look at the chart apparently her blood pressure has been on the lower side more recently and was more normal before she became ill in September she is lost quite a deal of weight between September and present date and I suspect this may have contributed to her blood pressure tending to skew lower.  Her recent infection with influenza with fevers myalgias chills diaphoresis and vomiting and port likely poor oral intake may have also further contributed to her being volume depleted.  I have personally spent 84 minutes involved in face-to-face and non-face-to-face activities for this patient on the day of the visit. Professional time spent includes the following activities: Preparing to see the patient (review of tests), Obtaining and/or reviewing separately obtained history (admission/discharge record), Performing  a medically appropriate examination and/or evaluation , Ordering medications/tests/procedures, referring and communicating with other health care professionals, Documenting clinical information in the EMR, Independently interpreting results (not separately reported), Communicating results to the patient/family/caregiver, Counseling and educating the patient/family/caregiver and Care coordination (not separately reported).   Evaluation of the patient requires complex antimicrobial therapy evaluation, counseling , isolation needs to reduce disease transmission and risk assessment and mitigation.    Review of Systems: Review of Systems  Constitutional:  Positive for chills, fever and malaise/fatigue. Negative for weight loss.  HENT:  Negative for  congestion and sore throat.   Eyes:  Negative for blurred vision and photophobia.  Respiratory:  Positive for cough. Negative for shortness of breath and wheezing.   Cardiovascular:  Negative for chest pain, palpitations and leg swelling.  Gastrointestinal:  Positive for nausea and vomiting. Negative for abdominal pain, blood in stool, constipation, diarrhea, heartburn and melena.  Genitourinary:  Negative for dysuria, flank pain and hematuria.  Musculoskeletal:  Negative for back pain, falls, joint pain and myalgias.  Skin:  Negative for itching and rash.  Neurological:  Negative for dizziness, focal weakness, loss of consciousness, weakness and headaches.  Endo/Heme/Allergies:  Does not bruise/bleed easily.  Psychiatric/Behavioral:  Negative for depression and suicidal ideas. The patient does not have insomnia.     Past Medical History:  Diagnosis Date   Breast cancer [facility])    right   Colon stricture (HCC) 05/07/2019   Colonic stricture (HCC) 11/19/2022   Crohn's colitis, with intestinal obstruction (HCC) 09/17/2013   Dx 2006 - right and left colon involved 2011 - pan-colitis 09/17/2013 left colitis 60-20 cm March 2021 subtotal colectomy ileosigmoid anastomosis for colonic strictures-Dr. Maisie Fus     Crohn's disease [facility])    Exacerbation of Crohn's disease of large intestine (HCC) 12/13/2022   Family history of breast cancer 06/11/2020   History of abdominal subtotal colectomy 11/19/2022   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 10/17/2010   Rectal bleeding    Uterine fibroid    Vitamin D deficiency 09/24/2013    Social History   Tobacco Use   Smoking status: Former    Types: Cigarettes   Smokeless tobacco: Never  Vaping Use   Vaping status: Never Used  Substance Use Topics   Alcohol use: Not Currently    Alcohol/week: 1.0 - 2.0 standard drink of alcohol    Types: 1 - 2 Glasses of wine per week   Drug use: No    Family History  Problem  Relation Age of Onset   Diabetes Father    Breast cancer Maternal Aunt        dx unknown age   Diabetes Paternal Aunt    Breast cancer Cousin        maternal cousin; dx unknown age   Esophageal cancer Neg Hx    Rectal cancer Neg Hx    Stomach cancer Neg Hx    Colon polyps Neg Hx    Colon cancer Neg Hx    Allergies  Allergen Reactions   Nsaids Other (See Comments)    Crohns disease/ IBD   Vancomycin Itching   Chlorhexidine Gluconate Itching    Able to use CHG swabs for ports, reaction with direct skin contact   Hydromorphone Other (See Comments)    Confusion/sedation   Lactose Other (See Comments)    Upset stomach/diarrhea   Lactose Intolerance (Gi) Other (See Comments)    Upset stomach/diarrhea    OBJECTIVE: Blood pressure 92/62,  pulse 77, temperature 97.8 F (36.6 C), temperature source Oral, resp. rate 17, height 5\' 4"  (1.626 m), weight 47.5 kg, last menstrual period 04/24/2021, SpO2 100%.  Physical Exam Constitutional:      General: She is not in acute distress.    Appearance: Normal appearance. She is well-developed. She is not ill-appearing or diaphoretic.  HENT:     Head: Normocephalic and atraumatic.     Right Ear: Hearing and external ear normal.     Left Ear: Hearing and external ear normal.     Nose: No nasal deformity or rhinorrhea.  Eyes:     General: No scleral icterus.    Conjunctiva/sclera: Conjunctivae normal.     Right eye: Right conjunctiva is not injected.     Left eye: Left conjunctiva is not injected.     Pupils: Pupils are equal, round, and reactive to light.  Neck:     Vascular: No JVD.  Cardiovascular:     Rate and Rhythm: Normal rate and regular rhythm.     Heart sounds: Normal heart sounds, S1 normal and S2 normal. No murmur heard.    No friction rub. No gallop.  Pulmonary:     Effort: No respiratory distress.     Breath sounds: No stridor. No wheezing or rhonchi.  Abdominal:     General: Bowel sounds are normal. There is no  distension.     Palpations: Abdomen is soft.  Musculoskeletal:        General: Normal range of motion.     Right shoulder: Normal.     Left shoulder: Normal.     Cervical back: Normal range of motion and neck supple.     Right hip: Normal.     Left hip: Normal.     Right knee: Normal.     Left knee: Normal.  Lymphadenopathy:     Head:     Right side of head: No submandibular, preauricular or posterior auricular adenopathy.     Left side of head: No submandibular, preauricular or posterior auricular adenopathy.     Cervical: No cervical adenopathy.     Right cervical: No superficial or deep cervical adenopathy.    Left cervical: No superficial or deep cervical adenopathy.  Skin:    General: Skin is warm and dry.     Coloration: Skin is not pale.     Findings: No abrasion, bruising, ecchymosis, erythema, lesion or rash.     Nails: There is no clubbing.  Neurological:     General: No focal deficit present.     Mental Status: She is alert and oriented to person, place, and time.     Sensory: No sensory deficit.     Coordination: Coordination normal.     Gait: Gait normal.  Psychiatric:        Attention and Perception: She is attentive.        Mood and Affect: Mood normal.        Speech: Speech normal.        Behavior: Behavior normal. Behavior is cooperative.        Thought Content: Thought content normal.        Judgment: Judgment normal.    Ostomy clean  Lab Results Lab Results  Component Value Date   WBC 24.0 (H) 05/09/2023   HGB 9.3 (L) 05/09/2023   HCT 29.1 (L) 05/09/2023   MCV 85.1 05/09/2023   PLT 258 05/09/2023    Lab Results  Component Value Date   CREATININE 1.22 (  H) 05/09/2023   BUN 34 (H) 05/09/2023   NA 131 (L) 05/09/2023   K 3.6 05/09/2023   CL 96 (L) 05/09/2023   CO2 20 (L) 05/09/2023    Lab Results  Component Value Date   ALT 111 (H) 05/09/2023   AST 88 (H) 05/09/2023   GGT 95 (H) 05/09/2023   ALKPHOS 294 (H) 05/09/2023   BILITOT 1.4 (H)  05/09/2023     Microbiology: Recent Results (from the past 240 hours)  Blood Culture (routine x 2)     Status: None (Preliminary result)   Collection Time: 05/09/23  4:25 PM   Specimen: BLOOD  Result Value Ref Range Status   Specimen Description   Final    BLOOD PORTA CATH Performed at [facility], [address] [place], [place] [address]    Special Requests   Final    BOTTLES DRAWN AEROBIC AND ANAEROBIC Blood Culture results may not be optimal due to an inadequate volume of blood received in culture bottles Performed at [facility], [address] [place], [place] [address]    Culture  Setup Time   Final    GRAM POSITIVE COCCI IN BOTH AEROBIC AND ANAEROBIC BOTTLES CRITICAL RESULT CALLED TO, READ BACK BY AND VERIFIED WITH: C SHADE,[email or web address]@[id number]  05/10/23 MK Performed at [facility], [address] [place], [place] [address]    Culture GRAM POSITIVE COCCI  Final   Report Status PENDING  Incomplete  Blood Culture ID Panel (Reflexed)     Status: Abnormal   Collection Time: 05/09/23  4:25 PM  Result Value Ref Range Status   Enterococcus faecalis NOT DETECTED NOT DETECTED Final   Enterococcus Faecium NOT DETECTED NOT DETECTED Final   Listeria monocytogenes NOT DETECTED NOT DETECTED Final   Staphylococcus species DETECTED (A) NOT DETECTED Final    Comment: CRITICAL RESULT CALLED TO, READ BACK BY AND VERIFIED WITH: C SHADE,PHARMD@[id number]  05/10/23 MK    Staphylococcus aureus (BCID) NOT DETECTED NOT DETECTED Final   Staphylococcus epidermidis DETECTED (A) NOT DETECTED Final    Comment: Methicillin (oxacillin) resistant coagulase negative staphylococcus. Possible blood culture contaminant (unless isolated from more than one blood culture draw or clinical case suggests pathogenicity). No antibiotic treatment is indicated for blood  culture contaminants. CRITICAL RESULT CALLED TO, READ BACK BY AND VERIFIED WITH: C SHADE,PHARMD@[id number]  05/10/23  MK    Staphylococcus lugdunensis NOT DETECTED NOT DETECTED Final   Streptococcus species NOT DETECTED NOT DETECTED Final   Streptococcus agalactiae NOT DETECTED NOT DETECTED Final   Streptococcus pneumoniae NOT DETECTED NOT DETECTED Final   Streptococcus pyogenes NOT DETECTED NOT DETECTED Final   A.calcoaceticus-baumannii NOT DETECTED NOT DETECTED Final   Bacteroides fragilis NOT DETECTED NOT DETECTED Final   Enterobacterales NOT DETECTED NOT DETECTED Final   Enterobacter cloacae complex NOT DETECTED NOT DETECTED Final   Escherichia coli NOT DETECTED NOT DETECTED Final   Klebsiella aerogenes NOT DETECTED NOT DETECTED Final   Klebsiella oxytoca NOT DETECTED NOT DETECTED Final   Klebsiella pneumoniae NOT DETECTED NOT DETECTED Final   Proteus species NOT DETECTED NOT DETECTED Final   Salmonella species NOT DETECTED NOT DETECTED Final   Serratia marcescens NOT DETECTED NOT DETECTED Final   Haemophilus influenzae NOT DETECTED NOT DETECTED Final   Neisseria meningitidis NOT DETECTED NOT DETECTED Final   Pseudomonas aeruginosa NOT DETECTED NOT DETECTED Final   Stenotrophomonas maltophilia NOT DETECTED NOT DETECTED Final   Candida albicans NOT DETECTED NOT DETECTED Final   Candida auris NOT  DETECTED NOT DETECTED Final   Candida glabrata NOT DETECTED NOT DETECTED Final   Candida krusei NOT DETECTED NOT DETECTED Final   Candida parapsilosis NOT DETECTED NOT DETECTED Final   Candida tropicalis NOT DETECTED NOT DETECTED Final   Cryptococcus neoformans/gattii NOT DETECTED NOT DETECTED Final   Methicillin resistance mecA/C DETECTED (A) NOT DETECTED Final    Comment: CRITICAL RESULT CALLED TO, READ BACK BY AND VERIFIED WITH: C SHADE,[email or web address]@[id number]  05/10/23 MK Performed at [facility], [address] [place], [place] [address]   Blood Culture (routine x 2)     Status: None (Preliminary result)   Collection Time: 05/09/23  4:40 PM   Specimen: BLOOD  Result Value Ref Range Status   Specimen  Description   Final    BLOOD LEFT ANTECUBITAL Performed at [facility], [address], [place], [place] [address]    Special Requests   Final    BOTTLES DRAWN AEROBIC AND ANAEROBIC Blood Culture results may not be optimal due to an inadequate volume of blood received in culture bottles Performed at [facility], [address], [place], [place] [address]    Culture  Setup Time   Final    GRAM POSITIVE COCCI IN CHAINS ANAEROBIC BOTTLE ONLY CRITICAL RESULT CALLED TO, READ BACK BY AND VERIFIED WITH: PHARMD CHRISTINE SHADE ON 05/10/23 @ 1239 BY DRT Performed at [facility], [address] [place], [place] [address]    Culture GRAM POSITIVE COCCI  Final   Report Status PENDING  Incomplete  Blood Culture ID Panel (Reflexed)     Status: Abnormal   Collection Time: 05/09/23  4:40 PM  Result Value Ref Range Status   Enterococcus faecalis NOT DETECTED NOT DETECTED Final   Enterococcus Faecium NOT DETECTED NOT DETECTED Final   Listeria monocytogenes NOT DETECTED NOT DETECTED Final   Staphylococcus species NOT DETECTED NOT DETECTED Final   Staphylococcus aureus (BCID) NOT DETECTED NOT DETECTED Final   Staphylococcus epidermidis NOT DETECTED NOT DETECTED Final   Staphylococcus lugdunensis NOT DETECTED NOT DETECTED Final   Streptococcus species DETECTED (A) NOT DETECTED Final    Comment: Not Enterococcus species, Streptococcus agalactiae, Streptococcus pyogenes, or Streptococcus pneumoniae. CRITICAL RESULT CALLED TO, READ BACK BY AND VERIFIED WITH: PHARMD CHRISTINE SHADE ON 05/10/23 @ 1239 BY DRT    Streptococcus agalactiae NOT DETECTED NOT DETECTED Final   Streptococcus pneumoniae NOT DETECTED NOT DETECTED Final   Streptococcus pyogenes NOT DETECTED NOT DETECTED Final   A.calcoaceticus-baumannii NOT DETECTED NOT DETECTED Final   Bacteroides fragilis NOT DETECTED NOT DETECTED Final   Enterobacterales NOT DETECTED NOT DETECTED Final   Enterobacter  cloacae complex NOT DETECTED NOT DETECTED Final   Escherichia coli NOT DETECTED NOT DETECTED Final   Klebsiella aerogenes NOT DETECTED NOT DETECTED Final   Klebsiella oxytoca NOT DETECTED NOT DETECTED Final   Klebsiella pneumoniae NOT DETECTED NOT DETECTED Final   Proteus species NOT DETECTED NOT DETECTED Final   Salmonella species NOT DETECTED NOT DETECTED Final   Serratia marcescens NOT DETECTED NOT DETECTED Final   Haemophilus influenzae NOT DETECTED NOT DETECTED Final   Neisseria meningitidis NOT DETECTED NOT DETECTED Final   Pseudomonas aeruginosa NOT DETECTED NOT DETECTED Final   Stenotrophomonas maltophilia NOT DETECTED NOT DETECTED Final   Candida albicans NOT DETECTED NOT DETECTED Final   Candida auris NOT DETECTED NOT DETECTED Final   Candida glabrata NOT DETECTED NOT DETECTED Final   Candida krusei NOT DETECTED NOT DETECTED Final   Candida  parapsilosis NOT DETECTED NOT DETECTED Final   Candida tropicalis NOT DETECTED NOT DETECTED Final   Cryptococcus neoformans/gattii NOT DETECTED NOT DETECTED Final    Comment: Performed at [facility], [address] [place], [place] [address]  Resp panel by RT-PCR (RSV, Flu A&B, Covid) Anterior Nasal Swab     Status: Abnormal   Collection Time: 05/09/23 10:45 PM   Specimen: Anterior Nasal Swab  Result Value Ref Range Status   SARS Coronavirus 2 by RT PCR NEGATIVE NEGATIVE Final    Comment: (NOTE) SARS-CoV-2 target nucleic acids are NOT DETECTED.  The SARS-CoV-2 RNA is generally detectable in upper respiratory specimens during the acute phase of infection. The lowest concentration of SARS-CoV-2 viral copies this assay can detect is 138 copies/mL. A negative result does not preclude SARS-Cov-2 infection and should not be used as the sole basis for treatment or other patient management decisions. A negative result may occur with  improper specimen collection/handling, submission of specimen other than nasopharyngeal swab, presence  of viral mutation(s) within the areas targeted by this assay, and inadequate number of viral copies(<138 copies/mL). A negative result must be combined with clinical observations, patient history, and epidemiological information. The expected result is Negative.  Fact Sheet for Patients:  BloggerCourse.com  Fact Sheet for Healthcare Providers:  SeriousBroker.it  This test is no t yet approved or cleared by the [place] FDA and  has been authorized for detection and/or diagnosis of SARS-CoV-2 by FDA under an Emergency Use Authorization (EUA). This EUA will remain  in effect (meaning this test can be used) for the duration of the COVID-19 declaration under Section 564(b)(1) of the Act, 21 U.S.C.section 360bbb-3(b)(1), unless the authorization is terminated  or revoked sooner.       Influenza A by PCR POSITIVE (A) NEGATIVE Final   Influenza B by PCR NEGATIVE NEGATIVE Final    Comment: (NOTE) The Xpert Xpress SARS-CoV-2/FLU/RSV plus assay is intended as an aid in the diagnosis of influenza from Nasopharyngeal swab specimens and should not be used as a sole basis for treatment. Nasal washings and aspirates are unacceptable for Xpert Xpress SARS-CoV-2/FLU/RSV testing.  Fact Sheet for Patients: BloggerCourse.com  Fact Sheet for Healthcare Providers: SeriousBroker.it  This test is not yet approved or cleared by the [place] FDA and has been authorized for detection and/or diagnosis of SARS-CoV-2 by FDA under an Emergency Use Authorization (EUA). This EUA will remain in effect (meaning this test can be used) for the duration of the COVID-19 declaration under Section 564(b)(1) of the Act, 21 U.S.C. section 360bbb-3(b)(1), unless the authorization is terminated or revoked.     Resp Syncytial Virus by PCR NEGATIVE NEGATIVE Final    Comment: (NOTE) Fact Sheet for  Patients: BloggerCourse.com  Fact Sheet for Healthcare Providers: SeriousBroker.it  This test is not yet approved or cleared by the [place] FDA and has been authorized for detection and/or diagnosis of SARS-CoV-2 by FDA under an Emergency Use Authorization (EUA). This EUA will remain in effect (meaning this test can be used) for the duration of the COVID-19 declaration under Section 564(b)(1) of the Act, 21 U.S.C. section 360bbb-3(b)(1), unless the authorization is terminated or revoked.  Performed at [facility], [address], [place], [place] [address]   Gastrointestinal Panel by PCR , Stool     Status: None   Collection Time: 05/09/23 10:45 PM   Specimen: Anterior Nasal Swab; Stool  Result Value Ref Range Status   Campylobacter species NOT  DETECTED NOT DETECTED Final   Plesimonas shigelloides NOT DETECTED NOT DETECTED Final   Salmonella species NOT DETECTED NOT DETECTED Final   Yersinia enterocolitica NOT DETECTED NOT DETECTED Final   Vibrio species NOT DETECTED NOT DETECTED Final   Vibrio cholerae NOT DETECTED NOT DETECTED Final   Enteroaggregative E coli (EAEC) NOT DETECTED NOT DETECTED Final   Enteropathogenic E coli (EPEC) NOT DETECTED NOT DETECTED Final   Enterotoxigenic E coli (ETEC) NOT DETECTED NOT DETECTED Final   Shiga like toxin producing E coli (STEC) NOT DETECTED NOT DETECTED Final   Shigella/Enteroinvasive E coli (EIEC) NOT DETECTED NOT DETECTED Final   Cryptosporidium NOT DETECTED NOT DETECTED Final   Cyclospora cayetanensis NOT DETECTED NOT DETECTED Final   Entamoeba histolytica NOT DETECTED NOT DETECTED Final   Giardia lamblia NOT DETECTED NOT DETECTED Final   Adenovirus F40/41 NOT DETECTED NOT DETECTED Final   Astrovirus NOT DETECTED NOT DETECTED Final   Norovirus GI/GII NOT DETECTED NOT DETECTED Final   Rotavirus A NOT DETECTED NOT DETECTED Final   Sapovirus (I, II, IV, and V)  NOT DETECTED NOT DETECTED Final    Comment: Performed at [facility], [address] [place], [place] [address]  MRSA Next Gen by PCR, Nasal     Status: Abnormal   Collection Time: 05/10/23  1:09 AM   Specimen: Nasal Mucosa; Nasal Swab  Result Value Ref Range Status   MRSA by PCR Next Gen DETECTED (A) NOT DETECTED Final    Comment: (NOTE) The GeneXpert MRSA Assay (FDA approved for NASAL specimens only), is one component of a comprehensive MRSA colonization surveillance program. It is not intended to diagnose MRSA infection nor to guide or monitor treatment for MRSA infections. Test performance is not FDA approved in patients less than 71 years old. Performed at [facility], [address] [place], [place] [address]     Acey Lav, MD [facility] for Infectious Disease [facility] [phone] pager  05/10/2023, 4:29 PM

## 2023-05-11 ENCOUNTER — Inpatient Hospital Stay (HOSPITAL_COMMUNITY): Payer: Managed Care, Other (non HMO)

## 2023-05-11 DIAGNOSIS — I959 Hypotension, unspecified: Secondary | ICD-10-CM | POA: Diagnosis not present

## 2023-05-11 DIAGNOSIS — R579 Shock, unspecified: Secondary | ICD-10-CM | POA: Diagnosis not present

## 2023-05-11 DIAGNOSIS — K50919 Crohn's disease, unspecified, with unspecified complications: Secondary | ICD-10-CM

## 2023-05-11 DIAGNOSIS — D72829 Elevated white blood cell count, unspecified: Secondary | ICD-10-CM | POA: Diagnosis not present

## 2023-05-11 DIAGNOSIS — R7881 Bacteremia: Secondary | ICD-10-CM

## 2023-05-11 DIAGNOSIS — A419 Sepsis, unspecified organism: Secondary | ICD-10-CM | POA: Diagnosis not present

## 2023-05-11 LAB — PHOSPHORUS: Phosphorus: 2.9 mg/dL (ref 2.5–4.6)

## 2023-05-11 LAB — ECHOCARDIOGRAM COMPLETE
Area-P 1/2: 2.73 cm2
Height: 64 in
S' Lateral: 2.4 cm
Single Plane A4C EF: 58.6 %
Weight: 1675.5 [oz_av]

## 2023-05-11 LAB — CBC
HCT: 26.6 % — ABNORMAL LOW (ref 36.0–46.0)
Hemoglobin: 8.3 g/dL — ABNORMAL LOW (ref 12.0–15.0)
MCH: 27.3 pg (ref 26.0–34.0)
MCHC: 31.2 g/dL (ref 30.0–36.0)
MCV: 87.5 fL (ref 80.0–100.0)
Platelets: 221 10*3/uL (ref 150–400)
RBC: 3.04 MIL/uL — ABNORMAL LOW (ref 3.87–5.11)
RDW: 13.9 % (ref 11.5–15.5)
WBC: 9.5 10*3/uL (ref 4.0–10.5)
nRBC: 0 % (ref 0.0–0.2)

## 2023-05-11 LAB — BASIC METABOLIC PANEL
Anion gap: 7 (ref 5–15)
BUN: 10 mg/dL (ref 6–20)
CO2: 28 mmol/L (ref 22–32)
Calcium: 8.6 mg/dL — ABNORMAL LOW (ref 8.9–10.3)
Chloride: 99 mmol/L (ref 98–111)
Creatinine, Ser: 0.91 mg/dL (ref 0.44–1.00)
GFR, Estimated: >60 mL/min (ref >60)
Glucose, Bld: 124 mg/dL — ABNORMAL HIGH (ref 70–99)
Potassium: 3.1 mmol/L — ABNORMAL LOW (ref 3.5–5.1)
Sodium: 134 mmol/L — ABNORMAL LOW (ref 135–145)

## 2023-05-11 LAB — CK: Total CK: <5 U/L — ABNORMAL LOW (ref 38–234)

## 2023-05-11 LAB — MAGNESIUM: Magnesium: 1.7 mg/dL (ref 1.7–2.4)

## 2023-05-11 LAB — GLUCOSE, CAPILLARY: Glucose-Capillary: 111 mg/dL — ABNORMAL HIGH (ref 70–99)

## 2023-05-11 MED ORDER — MAGNESIUM SULFATE 2 GM/50ML IV SOLN
2.0000 g | Freq: Once | INTRAVENOUS | Status: AC
  Administered 2023-05-11: 2 g via INTRAVENOUS
  Filled 2023-05-11: qty 50

## 2023-05-11 MED ORDER — TRAVASOL 10 % IV SOLN: INTRAVENOUS | Status: DC

## 2023-05-11 MED ORDER — MIDODRINE HCL 5 MG PO TABS
10.0000 mg | ORAL_TABLET | Freq: Three times a day (TID) | ORAL | Status: DC
Start: 2023-05-11 — End: 2023-05-17
  Administered 2023-05-11 – 2023-05-17 (×19): 10 mg via ORAL
  Filled 2023-05-11 (×19): qty 2

## 2023-05-11 MED ORDER — POTASSIUM CHLORIDE 20 MEQ PO PACK
40.0000 meq | PACK | Freq: Once | ORAL | Status: AC
  Administered 2023-05-11: 40 meq via ORAL
  Filled 2023-05-11: qty 2

## 2023-05-11 MED ORDER — INSULIN ASPART 100 UNIT/ML IJ SOLN: 0.0000 [IU] | Freq: Four times a day (QID) | INTRAMUSCULAR | Status: DC

## 2023-05-11 MED ORDER — POTASSIUM CHLORIDE CRYS ER 20 MEQ PO TBCR
40.0000 meq | EXTENDED_RELEASE_TABLET | Freq: Once | ORAL | Status: DC
  Filled 2023-05-11: qty 2

## 2023-05-11 MED ORDER — INSULIN ASPART 100 UNIT/ML IJ SOLN
0.0000 [IU] | Freq: Four times a day (QID) | INTRAMUSCULAR | Status: DC
Start: 2023-05-11 — End: 2023-05-14

## 2023-05-11 MED ORDER — KATE FARMS STANDARD 1.4 EN LIQD
325.0000 mL | Freq: Two times a day (BID) | ENTERAL | Status: DC
  Administered 2023-05-12: 325 mL via ORAL
  Filled 2023-05-11 (×2): qty 1000
  Filled 2023-05-11: qty 325
  Filled 2023-05-11 (×2): qty 1000
  Filled 2023-05-11: qty 325

## 2023-05-11 MED ORDER — POTASSIUM ACETATE 2 MEQ/ML IV SOLN
INTRAVENOUS | Status: DC
  Filled 2023-05-11: qty 795

## 2023-05-11 NOTE — Consult Note (Addendum)
WOC Nurse ostomy consult note; patient is well known to WOC team from previous admission; had ileostomy placed 12/16/2022 Dr. Hillery Hunter; has been wearing a 2 piece convex skin barrier and pouch at home without difficulty  Stoma type/location: LLQ ileostomy  Stomal assessment/size: 1" per patient, well budded, round   Peristomal assessment: not assessed, patient has pouch from home intact and defers to change at this time  Treatment options for stomal/peristomal skin:2" barrier ring   Output  Ostomy pouching: 2 piece system 2 1/4" convex skin barrier [employer] [place] [phone]) 2 1/4" pouch [facility] [place] [phone]) and 2" barrier ring [place] [phone])  Education provided: none, patient is independent in care of her ostomy.  Patient does wear a low profile system from [employer] at home. Patient and I discussed that we do not carry that in the hospital, she is welcome to have someone from home bring her supplies or use our 2 1/4" that we have in hospital.  Patient says she may ask someone from home to bring hers but is ok with using ostomy supplies we have available here while inpatient as well.    Enrolled patient in [employer] DC program: previous admission   Ordered (4) sets of 2 1/4" convex skin barrier, 2 1/4" pouch and 2" barrier rings for room.   WOC team will not follow this established ostomy. Re-consult if further needs arise.   Thank you,     Priscella Mann MSN, RN-BC, [employer] [phone]

## 2023-05-11 NOTE — Plan of Care (Signed)
Pt continues on levo @1 -2 to maintain b/p per perameters; pt maintains own ileostomy prn; denies pain Problem: Education: Goal: Knowledge of General Education information will improve Description: Including pain rating scale, medication(s)/side effects and non-pharmacologic comfort measures Outcome: Progressing   Problem: Health Behavior/Discharge Planning: Goal: Ability to manage health-related needs will improve Outcome: Progressing   Problem: Clinical Measurements: Goal: Ability to maintain clinical measurements within normal limits will improve Outcome: Progressing Goal: Will remain free from infection Outcome: Progressing Goal: Diagnostic test results will improve Outcome: Progressing Goal: Respiratory complications will improve Outcome: Progressing Goal: Cardiovascular complication will be avoided Outcome: Progressing   Problem: Activity: Goal: Risk for activity intolerance will decrease Outcome: Progressing   Problem: Nutrition: Goal: Adequate nutrition will be maintained Outcome: Progressing   Problem: Coping: Goal: Level of anxiety will decrease Outcome: Progressing   Problem: Elimination: Goal: Will not experience complications related to bowel motility Outcome: Progressing Goal: Will not experience complications related to urinary retention Outcome: Progressing   Problem: Pain Managment: Goal: General experience of comfort will improve and/or be controlled Outcome: Progressing   Problem: Safety: Goal: Ability to remain free from injury will improve Outcome: Progressing   Problem: Skin Integrity: Goal: Risk for impaired skin integrity will decrease Outcome: Progressing

## 2023-05-11 NOTE — Progress Notes (Addendum)
NAME:  [name], MRN:  [id number], DOB:  Sep 28, 1975, LOS: 2 ADMISSION DATE:  05/09/2023, CONSULTATION DATE:  05/09/23 REFERRING MD:  EDP, CHIEF COMPLAINT:  shock   History of Present Illness:  48 yo with complicated pmh presented today with weakness, chills, n/v that started today. Pt states that she has been in her normal state of health with exception of a uri over past few days. She states that despite this she has been eating and drinking as normal as well as tolerating her tpn. She denies fevers, states that today she had sudden onset of chills and vomited x1 but no change in stool output from her baseline. She states that if she did not feel so weak she would not have come to ed.   Upon presentation she was noted to be hypotensive, unresponsive to ivf as well as albumin infusion. She was cultured and started on iv abx after multiple conversations encouraging coverage for presumed sepsis in light of her inflammed small bowel noted on imaging as well as known tpn use, concern for fungal infection. She was also tachycardic to 160's but states that she has this regularly. After fluids her hr returned to 100-110 but BP ultimately req vasopressor initiation.   She is pending rvp. For now wil cont with abx and antifungal until cx return.   Ccm was asked to admit 2/2 need for low dose pressors.   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  Diagnosis of strep species in 1 out of 2 bottles and MRSA AP and the other 1 out of 2 bottles likely contaminant.  She has a history of Crohn's complicated by intra-abdominal abscess and short gut syndrome on TPN.  She has large amount of weight loss in September and low blood pressure since then.  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 Plan to continue Tamiflu, continue daptomycin to cover strep species and epi, subject to HIV and vitals hepatitis screening.  And follow-up 20 panel RVP. Positive for H1 influenza 2009  Interim History / Subjective:    05/11/2023: On Levophed 2 mcg.  On room air.  Afebrile with normal white count now.  Random cortisol is 7.6.  Discussed hydrocortisone but patient is reluctant because of chronic steroids side effect.  Nursing inquiring about midodrine to replace blood pressure.  She is not on oxygen.  She is able to eat overall.  She says she sat up.  Denies any major complaints. TON still on hold since admit  Objective   Blood pressure 99/70, pulse (!) 55, temperature 98.1 F (36.7 C), temperature source Oral, resp. rate 15, height 5\' 4"  (1.626 m), weight 47.5 kg, last menstrual period 04/24/2021, SpO2 100%.        Intake/Output Summary (Last 24 hours) at 05/11/2023 0745 Last data filed at 05/11/2023 0738 Gross per 24 hour  Intake 3054.25 ml  Output --  Net 3054.25 ml   Filed Weights   05/09/23 1550 05/10/23 0105  Weight: 49 kg 47.5  kg     General Appearance:  Looks stable but thin and cachectic Head:  Normocephalic, without obvious abnormality, atraumatic Eyes:  PERRL - yes, conjunctiva/corneas - muddy     Ears:  Normal external ear canals, both ears Nose:  G tube - no Throat:  ETT TUBE - no , OG tube - no Neck:  Supple,  No enlargement/tenderness/nodules Lungs: Clear to auscultation bilaterally,  Chest has right-sided Port-A-Cath and other scars. Heart:  S1 and S2 normal, no murmur, CVP -  no.  Pressors - no Abdomen:  Soft, no masses, no organomegaly Genitalia / Rectal:  Not done Extremities:  Extremities-intact Skin:  ntact in exposed areas . Sacral area -not examined but per nursing no sacral decub Neurologic:  Sedation - none -> RASS - +1 . Moves all 4s - yes. CAM-ICU - neg . Orientation - x3+     Resolved Hospital Problem list       Assessment & Plan:  ASSESSMENT / PLAN:  PULMONARY  A:  History of pulmonary embolism lower lobe subsegmental December 19, 2022 Influenza but not in respiratory distress or requiring oxygen  05/11/2023 -> not on oxygen  P:   Monitor clinically Continue Eliquis for history of PE   NEUROLOGIC A:   Baseline Xanax as needed  Baseline oxycodone as needed  - 05/11/2023 intact mental status  P:   Monitor without baseline Xanax and oxycodone    VASCULAR A:   Baseline metoprolol and Status post right upper extremity PICC placement by IR 01/09/2023 Circulatory shock this admission in the setting of progressive weight loss and slow decline in blood pressure and current influenza A  - 05/11/2023: Random cortisol at 7.6.  On low-dose Levophed  P:  Levophed for MAP goal greater than 65 Add midodrine Based on course then have a conversation with patient about stress dose steroid [currently reluctant]  CARDIAC STRUCTURAL A: Normal echo May 11, 2023  - 05/11/2023   P: Monitor  CARDIAC ELECTRICAL A: Sinus tachycardia 05/11/2023   P: Telemetry  INFECTIOUS A:   Intra-abdominal abscesses since September 2024 complicated course involving infectious disease consultation multiple antibiotics and antifungal -   -Fall 2024 intolerance to Cipro and Flagyl  -November 2024 added Augmentin  CT abdomen this admission January 2025 fairly benign with diffuse small bowel thickening but no obstructive pathology and presence of left-sided ileostomy and colectomy with small amount of free fluid in the right pelvis.  But no  abscess  05/11/2023 -being followed by infectious disease consult.  Afebrile  P:   Daptomycin per infectious diseases Tamiflu per infectious diseases  RENAL A:  Acute kidney injury at admission creatinine 1.4 mg percent [baseline 0.7 mg percent]  05/11/2023 -improved to 0.9 mg percent  P:  Hydrate and monitor  METABOLIC A: At risk for bicarbonate loss and metabolic acidosis given short gut syndrome  05/11/2023 -bicarb 28  P Monitor clinically*   ELECTROLYTES A:  Hypokalemia potassium less than 4 Hypomagnesemia magnesium less than 2 P: Replete both   GASTROINTESTINAL A:   Crohn's disease status post protracted hospitalization September 2024 for small bowel obstruction requiring bowel obstruction due to ischemia status post exploratory laparotomy with ileostomy abdominal wound closure -   Subsequent short gut syndrome -since October 2024 - on TPN 16h  per day 5pm start "spl formula" + oral   Chronic right lower quadrant cutaneous drain  -status post exchange 03/07/2023 Status post ostomy bag    P:   Monitor  HEMATOLOGIC   -  HEME A:  Chronic anemia iron 8 g% baseline -1.  Iron deficiency  -No active bleeding P:  - PRBC for hgb </= 6.9gm%    - exceptions are   -  if ACS susepcted/confirmed then transfuse for hgb </= 8.0gm%,  or    -  active bleeding with hemodynamic instability, then transfuse regardless of hemoglobin value   At at all times try to transfuse 1 unit prbc as possible with exception of active hemorrhage   HEMATOLOGIC - Platelets A Normal platelets at baseline  05/11/2023 normal platelets   P Monitor DVT prophylaxis  ENDOCRINE A:   Concern for relative adrenal insufficiency-serum cortisol 7.6 on 05/10/2023  05/11/2023 reluctant to take stress dose steroids  P:   Monitor  MSK/DERM History of metastatic breast cancer Severe protein calorie malnutrition following September 2024 admission for small bowel obstruction with  strangulation   Plan - Nutritional support     Best Practice (right click and "Reselect all SmartList Selections" daily)   Diet/type: Regular consistency (see orders) and TPN - restart TPN 05/11/23 if ok with ID d/with Melissa RPh DVT prophylaxis DOAC Pressure ulcer(s): N/A GI prophylaxis: N/A Lines: cvc in place for chronic tpn Foley:  N/A Code Status:  full code Last date of multidisciplinary goals of care discussion [full code per discussion with pt]  05/11/2023: Patient at bedside updated.     ATTESTATION & SIGNATURE   The patient [name] is critically ill with multiple organ systems failure and requires high complexity decision making for assessment and support, frequent evaluation and titration of therapies, application of advanced monitoring technologies and extensive interpretation of multiple databases and discussion with other appropriate health care personnel such as bedside nurses, social workers, case Production designer, theatre/television/film, consultants, respiratory therapists, nutritionists, secretaries etc.,  Critical care time includes but is not restricted to just documentation time. Documentation can happen in parallel or sequential to care time depending on case mix urgency and priorities for the shift. So, overall critical Care Time devoted to patient care services described in this note is  40  Minutes.   This time reflects time of care of this signee Dr Kalman Shan which includ does not reflect procedure time, or teaching time or supervisory time of PA/NP/Med student/Med Resident etc but could involve care discussion time     Dr. Kalman Shan, M.D., [facility].C.P Pulmonary and Critical Care Medicine Staff Physician, [place] [place] Pager: [phone], If no answer or between  15:00h - 7:00h: call [phone]  05/11/2023 7:45 AM     LABS    PULMONARY No results for input(s): "PHART", "PCO2ART", "PO2ART", "HCO3", "TCO2", "O2SAT"  in the last 168 hours.  Invalid input(s): "PCO2", "PO2"  CBC Recent Labs  Lab 05/09/23 1625 05/09/23 2245 05/11/23 0645  HGB 10.8* 9.3* 8.3*  HCT 33.8* 29.1* 26.6*  WBC 19.4* 24.0* 9.5  PLT 255 258 221    COAGULATION Recent Labs  Lab 05/09/23 1625  INR 1.2    CARDIAC  No results for input(s): "TROPONINI" in the last 168 hours. No results for input(s): "PROBNP" in the last 168 hours.   CHEMISTRY Recent Labs  Lab 05/09/23 1625 05/09/23 2245 05/11/23 0645  NA 131*  --  134*  K 3.6  --  3.1*  CL 96*  --  99  CO2 20*  --  28  GLUCOSE 97  --  124*  BUN 34*  --  10  CREATININE 1.40*  1.22* 0.91  CALCIUM 9.1  --  8.6*  MG  --   --  1.7  PHOS  --   --  2.9   Estimated Creatinine Clearance: 58.5 mL/min (by C-G formula based on SCr of 0.91 mg/dL).   LIVER Recent Labs  Lab 05/09/23 1625  AST 88*  ALT 111*  ALKPHOS 294*  BILITOT 1.4*  PROT 8.5*  ALBUMIN 3.5  INR 1.2     INFECTIOUS Recent Labs  Lab 05/09/23 1635  LATICACIDVEN 1.0     ENDOCRINE CBG (last 3)  No results for input(s): "GLUCAP" in the last 72 hours.       IMAGING x48h  - image(s) personally visualized  -   highlighted in bold

## 2023-05-11 NOTE — Progress Notes (Signed)
Initial Nutrition Assessment  DOCUMENTATION CODES:   Non-severe (moderate) malnutrition in context of chronic illness  INTERVENTION:  - TPN to start this evening and run for 16 hours (1800-1000) - per home regimen  - TPN management per pharmacy.  - Regular diet.  [employer] 1.4 PO BID, each supplement provides 455 kcal and 20 grams protein. - Checking vitamin D level. Patient with history of vitamin D deficiency in September 2024 and has not been taking supplementation.  - Monitor weight trends.  NUTRITION DIAGNOSIS:   Moderate Malnutrition related to chronic illness (Crohn's disease with short gut syndrome) as evidenced by moderate fat depletion, moderate muscle depletion.  GOAL:   Patient will meet greater than or equal to 90% of their needs  MONITOR:   PO intake, Supplement acceptance, Labs, Weight trends  REASON FOR ASSESSMENT:   Consult New TPN/TNA  ASSESSMENT:   48 y.o. 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 Admitted for septic shock.   1/29 Admit 1/31 TPN to start  Patient well known to this RD from recent hospitalization from 12/13/22-01/16/23.  She reports a recent UBW of 108# but that she has been working on gaining weight over the past 2-3 months.  Per EMR, weight has been mostly stable from 104-109# the past 4 months.   She reports eating 2-3 meals a day prior to admission. Was not drinking any nutrition supplements as unsure what might be best tolerated. Appetite normal.  She has also been on cylic TPN at home. Reports TPN runs for 16 hours overnight, usually from 5pm-9am. She receives lipids 3 times weekly (MWF). Patient is followed by [facility].  Tolerating well at home. Notes she was on 12 hour cyclic TPN but was having "liver issues" so increase to 15 hours and tolerating well.   She is  currently on a regular diet and reports eating fairly well. She is agreeable to receive Molli Posey during admission to drink (consumed it during last admission and enjoyed/tolerated).   Plan to start TPN this evening with lipids. Plan to continue 16 hour cyclic TPN regimen.  Of note, this will provide patient with 1230 kcals and 80g of protein. According to Pharmacy's note, this is similar to what she had been getting PTA. However, as she was only getting lipids 3x weekly, unsure If this is included lipid kcals or not.   Medications reviewed and include: -  Labs reviewed:  Na 134 K+ 3.1 Vitamin D low on 12/28/22   NUTRITION - FOCUSED PHYSICAL EXAM:  Flowsheet Row Most Recent Value  Orbital Region Moderate depletion  Upper Arm Region Moderate depletion  Thoracic and Lumbar Region Mild depletion  Buccal Region No depletion  Temple Region Moderate depletion  Clavicle Bone Region Moderate depletion  Clavicle and Acromion Bone Region Moderate depletion  Scapular Bone Region Unable to assess  Dorsal Hand No depletion  Patellar Region Mild depletion  Anterior Thigh Region Mild depletion  Posterior Calf Region Moderate depletion  Edema (RD Assessment) None  Hair Reviewed  Eyes Reviewed  Mouth Reviewed  Skin Reviewed  Nails Reviewed       Diet Order:   Diet Order             Diet regular Room service appropriate? Yes; Fluid consistency: Thin  Diet effective now  EDUCATION NEEDS:  Education needs have been addressed  Skin:  Skin Assessment: Reviewed RN Assessment  Last BM:  1/31 - ileostomy  Height:  Ht Readings from Last 1 Encounters:  05/10/23 5\' 4"  (1.626 m)   Weight:  Wt Readings from Last 1 Encounters:  05/10/23 47.5 kg   BMI:  Body mass index is 17.97 kg/m.  Estimated Nutritional Needs:  Kcal:  1650-1800 kcals Protein:  80-95 grams Fluid:  >/= 1.7L    Shelle Iron RD, LDN Contact via Secure Chat.

## 2023-05-11 NOTE — Progress Notes (Addendum)
PHARMACY - TOTAL PARENTERAL NUTRITION CONSULT NOTE   Indication: Short bowel syndrome  Patient Measurements: Height: 5\' 4"  (162.6 cm) Weight: 47.5 kg (104 lb 11.5 oz) IBW/kg (Calculated) : 54.7 TPN AdjBW (KG): 47.5 Body mass index is 17.97 kg/m. 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 Last administered: 1/27-1/28 1/31 TPN formula from [facility] faxed to [facility]  Glucose / Insulin: No history of DM. FBS elevated at 124. Electrolytes:  Sodium 134, K 3.1 (replaced by provider) - low.   Others WNL. Renal: SCr and BUN WNL Hepatic: labs ordered Intake / Output; MIVF:  Output not charted yesterday. No MIVF GI Imaging: 1/29 No obstruction. Left-sided ileostomy post colectomy. GI Surgeries / Procedures: none this admission  Central access: prior to admission CVC Double Lumen in place TPN start date: 1/31  Nutritional Goals: Pending Dietician assessment From [facility], MWF with lipids cyclic TPN formula provides 80 g of protein and 1214 kcals per day 1/31 Inpt TPN with lipids cyclic TPN formula provides 80 g of protein and 1230 kcal per day  RD Assessment: Pending    Current Nutrition:  Regular diet and TPN  Plan:  Start cyclic TPN at 1800: Begin at 50 ml/hr for 1 hour then increase rate to 100 ml/hr for 14 hours then decrease rate to 50 ml/hr for 1 hour then stop. Electrolytes in TPN: Na 100 mEq/L, K 50 mEq/L, Ca 4 mEq/L, Mg 5 mEq/L, and Phos 10 mmol/L. Cl:Ac 1:1 Add standard MVI and trace elements to TPN Initiate Very Sensitive q6h SSI and adjust as needed  Monitor TPN labs on tomorrow then Mon/Thurs  Thank you for allowing pharmacy to be a part of this patient's care.  Selinda Eon, PharmD, BCPS Clinical Pharmacist [place] 05/11/2023 12:12 PM

## 2023-05-11 NOTE — Progress Notes (Signed)
  Echocardiogram 2D Echocardiogram has been performed.  [name] 05/11/2023, 8:49 AM

## 2023-05-11 NOTE — Progress Notes (Addendum)
Subjective: No new complaints   Antibiotics:  Anti-infectives (From admission, onward)    Start     Dose/Rate Route Frequency Ordered Stop   05/10/23 2300  fluconazole (DIFLUCAN) IVPB 200 mg  Status:  Discontinued       Placed in "Followed by" Linked Group   200 mg 100 mL/hr over 60 Minutes Intravenous Every 24 hours 05/09/23 2249 05/10/23 1443   05/10/23 2200  oseltamivir (TAMIFLU) capsule 30 mg       Placed in "Followed by" Linked Group   30 mg Oral 2 times daily 05/10/23 0844 05/15/23 0959   05/10/23 1530  DAPTOmycin (CUBICIN) IVPB 500 mg/74mL premix        500 mg 100 mL/hr over 30 Minutes Intravenous Daily 05/10/23 1443     05/10/23 1000  ceFEPIme (MAXIPIME) 2 g in sodium chloride 0.9 % 100 mL IVPB  Status:  Discontinued        2 g 200 mL/hr over 30 Minutes Intravenous Every 12 hours 05/10/23 0503 05/10/23 1443   05/10/23 1000  oseltamivir (TAMIFLU) capsule 75 mg  Status:  Discontinued        75 mg Oral 2 times daily 05/10/23 0838 05/10/23 0845   05/10/23 0930  oseltamivir (TAMIFLU) capsule 75 mg       Placed in "Followed by" Linked Group   75 mg Oral  Once 05/10/23 0844 05/10/23 0938   05/10/23 0900  linezolid (ZYVOX) IVPB 600 mg  Status:  Discontinued        600 mg 300 mL/hr over 60 Minutes Intravenous Every 12 hours 05/10/23 0758 05/10/23 1443   05/10/23 0600  metroNIDAZOLE (FLAGYL) IVPB 500 mg  Status:  Discontinued        500 mg 100 mL/hr over 60 Minutes Intravenous Every 12 hours 05/10/23 0456 05/10/23 1443   05/09/23 2300  piperacillin-tazobactam (ZOSYN) IVPB 3.375 g  Status:  Discontinued        3.375 g 12.5 mL/hr over 240 Minutes Intravenous Every 8 hours 05/09/23 2244 05/10/23 0456   05/09/23 2300  fluconazole (DIFLUCAN) IVPB 400 mg       Placed in "Followed by" Linked Group   400 mg 100 mL/hr over 120 Minutes Intravenous  Once 05/09/23 2249 05/10/23 0855   05/09/23 2215  ceFEPIme (MAXIPIME) 2 g in sodium chloride 0.9 % 100 mL IVPB  Status:   Discontinued        2 g 200 mL/hr over 30 Minutes Intravenous  Once 05/09/23 2214 05/09/23 2237   05/09/23 1715  vancomycin (VANCOCIN) IVPB 1000 mg/200 mL premix        1,000 mg 200 mL/hr over 60 Minutes Intravenous  Once 05/09/23 1706 05/09/23 2338   05/09/23 1715  ceFEPIme (MAXIPIME) 2 g in sodium chloride 0.9 % 100 mL IVPB  Status:  Discontinued        2 g 200 mL/hr over 30 Minutes Intravenous  Once 05/09/23 1706 05/09/23 2214       Medications: Scheduled Meds:  apixaban  5 mg Oral BID   midodrine  10 mg Oral TID WC   mupirocin ointment  1 Application Nasal BID   oseltamivir  30 mg Oral BID   sodium chloride flush  10-40 mL Intracatheter Q12H   Continuous Infusions:  DAPTOmycin Stopped (05/10/23 1528)   norepinephrine (LEVOPHED) Adult infusion Stopped (05/11/23 0848)   PRN Meds:.docusate sodium, ondansetron (ZOFRAN) IV, mouth rinse, polyethylene glycol, sodium chloride flush    Objective: Weight  change:   Intake/Output Summary (Last 24 hours) at 05/11/2023 1110 Last data filed at 05/11/2023 1020 Gross per 24 hour  Intake 2295.38 ml  Output --  Net 2295.38 ml   Blood pressure 93/62, pulse (!) 101, temperature 98.7 F (37.1 C), temperature source Oral, resp. rate 18, height 5\' 4"  (1.626 m), weight 47.5 kg, last menstrual period 04/24/2021, SpO2 99%. Temp:  [97.8 F (36.6 C)-98.7 F (37.1 C)] 98.7 F (37.1 C) (01/31 0800) Pulse Rate:  [55-158] 101 (01/31 1000) Resp:  [12-31] 18 (01/31 1000) BP: (78-123)/(47-81) 93/62 (01/31 1000) SpO2:  [67 %-100 %] 99 % (01/31 1000)  Physical Exam: Physical Exam Constitutional:      General: She is not in acute distress.    Appearance: She is well-developed. She is not diaphoretic.  HENT:     Head: Normocephalic and atraumatic.     Right Ear: External ear normal.     Left Ear: External ear normal.     Mouth/Throat:     Pharynx: No oropharyngeal exudate.  Eyes:     General: No scleral icterus.    Conjunctiva/sclera:  Conjunctivae normal.     Pupils: Pupils are equal, round, and reactive to light.  Cardiovascular:     Rate and Rhythm: Normal rate and regular rhythm.  Pulmonary:     Effort: Pulmonary effort is normal. No respiratory distress.     Breath sounds: Normal breath sounds. No wheezing.  Abdominal:     General: Bowel sounds are normal. There is no distension.     Palpations: Abdomen is soft.     Tenderness: There is no abdominal tenderness. There is no rebound.  Musculoskeletal:        General: No tenderness. Normal range of motion.  Lymphadenopathy:     Cervical: No cervical adenopathy.  Skin:    General: Skin is warm and dry.     Coloration: Skin is not pale.     Findings: No erythema or rash.  Neurological:     General: No focal deficit present.     Mental Status: She is alert and oriented to person, place, and time.     Motor: No abnormal muscle tone.  Psychiatric:        Mood and Affect: Mood normal.        Behavior: Behavior normal.        Thought Content: Thought content normal.        Judgment: Judgment normal.    Port is clean  CBC:    BMET Recent Labs    05/09/23 1625 05/09/23 2245 05/11/23 0645  NA 131*  --  134*  K 3.6  --  3.1*  CL 96*  --  99  CO2 20*  --  28  GLUCOSE 97  --  124*  BUN 34*  --  10  CREATININE 1.40* 1.22* 0.91  CALCIUM 9.1  --  8.6*     Liver Panel  Recent Labs    05/09/23 1625  PROT 8.5*  ALBUMIN 3.5  AST 88*  ALT 111*  ALKPHOS 294*  BILITOT 1.4*       Sedimentation Rate No results for input(s): "ESRSEDRATE" in the last 72 hours. C-Reactive Protein No results for input(s): "CRP" in the last 72 hours.  Micro Results: Recent Results (from the past 720 hours)  Blood Culture (routine x 2)     Status: None (Preliminary result)   Collection Time: 05/09/23  4:25 PM   Specimen: BLOOD  Result Value Ref Range  Status   Specimen Description   Final    BLOOD PORTA CATH Performed at [facility], [address] [place], [place] [address]    Special Requests   Final    BOTTLES DRAWN AEROBIC AND ANAEROBIC Blood Culture results may not be optimal due to an inadequate volume of blood received in culture bottles Performed at [facility], [address] [place], [place] [address]    Culture  Setup Time   Final    GRAM POSITIVE COCCI IN BOTH AEROBIC AND ANAEROBIC BOTTLES CRITICAL RESULT CALLED TO, READ BACK BY AND VERIFIED WITH: C SHADE,[email or web address]@[id number]  05/10/23 MK Performed at [facility], [address], [place], [place] [address]    Culture GRAM POSITIVE COCCI  Final   Report Status PENDING  Incomplete  Blood Culture ID Panel (Reflexed)     Status: Abnormal   Collection Time: 05/09/23  4:25 PM  Result Value Ref Range Status   Enterococcus faecalis NOT DETECTED NOT DETECTED Final   Enterococcus Faecium NOT DETECTED NOT DETECTED Final   Listeria monocytogenes NOT DETECTED NOT DETECTED Final   Staphylococcus species DETECTED (A) NOT DETECTED Final    Comment: CRITICAL RESULT CALLED TO, READ BACK BY AND VERIFIED WITH: C SHADE,PHARMD@[id number]  05/10/23 MK    Staphylococcus aureus (BCID) NOT DETECTED NOT DETECTED Final   Staphylococcus epidermidis DETECTED (A) NOT DETECTED Final    Comment: Methicillin (oxacillin) resistant coagulase negative staphylococcus. Possible blood culture contaminant (unless isolated from more than one blood culture draw or clinical case suggests pathogenicity). No antibiotic treatment is indicated for blood  culture contaminants. CRITICAL RESULT CALLED TO, READ BACK BY AND VERIFIED WITH: C SHADE,PHARMD@[id number]  05/10/23 MK    Staphylococcus lugdunensis NOT DETECTED NOT DETECTED Final   Streptococcus species NOT DETECTED NOT DETECTED Final   Streptococcus agalactiae NOT DETECTED NOT DETECTED Final   Streptococcus pneumoniae NOT DETECTED NOT DETECTED Final   Streptococcus pyogenes NOT DETECTED NOT DETECTED Final   A.calcoaceticus-baumannii NOT  DETECTED NOT DETECTED Final   Bacteroides fragilis NOT DETECTED NOT DETECTED Final   Enterobacterales NOT DETECTED NOT DETECTED Final   Enterobacter cloacae complex NOT DETECTED NOT DETECTED Final   Escherichia coli NOT DETECTED NOT DETECTED Final   Klebsiella aerogenes NOT DETECTED NOT DETECTED Final   Klebsiella oxytoca NOT DETECTED NOT DETECTED Final   Klebsiella pneumoniae NOT DETECTED NOT DETECTED Final   Proteus species NOT DETECTED NOT DETECTED Final   Salmonella species NOT DETECTED NOT DETECTED Final   Serratia marcescens NOT DETECTED NOT DETECTED Final   Haemophilus influenzae NOT DETECTED NOT DETECTED Final   Neisseria meningitidis NOT DETECTED NOT DETECTED Final   Pseudomonas aeruginosa NOT DETECTED NOT DETECTED Final   Stenotrophomonas maltophilia NOT DETECTED NOT DETECTED Final   Candida albicans NOT DETECTED NOT DETECTED Final   Candida auris NOT DETECTED NOT DETECTED Final   Candida glabrata NOT DETECTED NOT DETECTED Final   Candida krusei NOT DETECTED NOT DETECTED Final   Candida parapsilosis NOT DETECTED NOT DETECTED Final   Candida tropicalis NOT DETECTED NOT DETECTED Final   Cryptococcus neoformans/gattii NOT DETECTED NOT DETECTED Final   Methicillin resistance mecA/C DETECTED (A) NOT DETECTED Final    Comment: CRITICAL RESULT CALLED TO, READ BACK BY AND VERIFIED WITH: C SHADE,PHARMD@[id number]  05/10/23 MK Performed at [facility], [address], [place], [place] [address]   Blood Culture (routine x 2)     Status: None (Preliminary result)   Collection Time: 05/09/23  4:40 PM  Specimen: BLOOD  Result Value Ref Range Status   Specimen Description   Final    BLOOD LEFT ANTECUBITAL Performed at [facility], [address] [place], [place] [address]    Special Requests   Final    BOTTLES DRAWN AEROBIC AND ANAEROBIC Blood Culture results may not be optimal due to an inadequate volume of blood received in culture bottles Performed at [facility], [address] [place], [place] [address]    Culture  Setup Time   Final    GRAM POSITIVE COCCI IN CHAINS ANAEROBIC BOTTLE ONLY CRITICAL RESULT CALLED TO, READ BACK BY AND VERIFIED WITH: PHARMD CHRISTINE SHADE ON 05/10/23 @ 1239 BY DRT Performed at [facility], [address] [place], [place] [address]    Culture GRAM POSITIVE COCCI  Final   Report Status PENDING  Incomplete  Fungus culture, blood     Status: None (Preliminary result)   Collection Time: 05/09/23  4:40 PM   Specimen: BLOOD  Result Value Ref Range Status   Specimen Description   Final    BLOOD Performed at [facility], [address] [place], [place] [address]    Special Requests   Final    NONE Performed at [facility], [address], [place], [place] [address]    Fungal Smear   Final    GRAM POSITIVE COCCI IN CLUSTERS AEROBIC BOTTLE ONLY CRITICAL VALUE NOTED.  VALUE IS CONSISTENT WITH PREVIOUSLY REPORTED AND CALLED VALUE. Performed at [facility], [address], [place], [place] [address]    Culture GRAM POSITIVE COCCI IN CLUSTERS  Final   Report Status PENDING  Incomplete  Blood Culture ID Panel (Reflexed)     Status: Abnormal   Collection Time: 05/09/23  4:40 PM  Result Value Ref Range Status   Enterococcus faecalis NOT DETECTED NOT DETECTED Final   Enterococcus Faecium NOT DETECTED NOT DETECTED Final   Listeria monocytogenes NOT DETECTED NOT DETECTED Final   Staphylococcus species NOT DETECTED NOT DETECTED Final   Staphylococcus aureus (BCID) NOT DETECTED NOT DETECTED Final   Staphylococcus epidermidis NOT DETECTED NOT DETECTED Final   Staphylococcus lugdunensis NOT DETECTED NOT DETECTED Final   Streptococcus species DETECTED (A) NOT DETECTED Final    Comment: Not Enterococcus species, Streptococcus agalactiae, Streptococcus pyogenes, or Streptococcus pneumoniae. CRITICAL RESULT CALLED TO, READ BACK BY AND VERIFIED  WITH: PHARMD CHRISTINE SHADE ON 05/10/23 @ 1239 BY DRT    Streptococcus agalactiae NOT DETECTED NOT DETECTED Final   Streptococcus pneumoniae NOT DETECTED NOT DETECTED Final   Streptococcus pyogenes NOT DETECTED NOT DETECTED Final   A.calcoaceticus-baumannii NOT DETECTED NOT DETECTED Final   Bacteroides fragilis NOT DETECTED NOT DETECTED Final   Enterobacterales NOT DETECTED NOT DETECTED Final   Enterobacter cloacae complex NOT DETECTED NOT DETECTED Final   Escherichia coli NOT DETECTED NOT DETECTED Final   Klebsiella aerogenes NOT DETECTED NOT DETECTED Final   Klebsiella oxytoca NOT DETECTED NOT DETECTED Final   Klebsiella pneumoniae NOT DETECTED NOT DETECTED Final   Proteus species NOT DETECTED NOT DETECTED Final   Salmonella species NOT DETECTED NOT DETECTED Final   Serratia marcescens NOT DETECTED NOT DETECTED Final   Haemophilus influenzae NOT DETECTED NOT DETECTED Final   Neisseria meningitidis NOT DETECTED NOT DETECTED Final   Pseudomonas aeruginosa NOT DETECTED NOT DETECTED Final   Stenotrophomonas maltophilia NOT DETECTED NOT DETECTED Final   Candida albicans NOT DETECTED NOT DETECTED Final   Candida  auris NOT DETECTED NOT DETECTED Final   Candida glabrata NOT DETECTED NOT DETECTED Final   Candida krusei NOT DETECTED NOT DETECTED Final   Candida parapsilosis NOT DETECTED NOT DETECTED Final   Candida tropicalis NOT DETECTED NOT DETECTED Final   Cryptococcus neoformans/gattii NOT DETECTED NOT DETECTED Final    Comment: Performed at [facility], [address], [place], [place] [address]  Resp panel by RT-PCR (RSV, Flu A&B, Covid) Anterior Nasal Swab     Status: Abnormal   Collection Time: 05/09/23 10:45 PM   Specimen: Anterior Nasal Swab  Result Value Ref Range Status   SARS Coronavirus 2 by RT PCR NEGATIVE NEGATIVE Final    Comment: (NOTE) SARS-CoV-2 target nucleic acids are NOT DETECTED.  The SARS-CoV-2 RNA is generally detectable in upper respiratory specimens  during the acute phase of infection. The lowest concentration of SARS-CoV-2 viral copies this assay can detect is 138 copies/mL. A negative result does not preclude SARS-Cov-2 infection and should not be used as the sole basis for treatment or other patient management decisions. A negative result may occur with  improper specimen collection/handling, submission of specimen other than nasopharyngeal swab, presence of viral mutation(s) within the areas targeted by this assay, and inadequate number of viral copies(<138 copies/mL). A negative result must be combined with clinical observations, patient history, and epidemiological information. The expected result is Negative.  Fact Sheet for Patients:  BloggerCourse.com  Fact Sheet for Healthcare Providers:  SeriousBroker.it  This test is no t yet approved or cleared by the [place] FDA and  has been authorized for detection and/or diagnosis of SARS-CoV-2 by FDA under an Emergency Use Authorization (EUA). This EUA will remain  in effect (meaning this test can be used) for the duration of the COVID-19 declaration under Section 564(b)(1) of the Act, 21 U.S.C.section 360bbb-3(b)(1), unless the authorization is terminated  or revoked sooner.       Influenza A by PCR POSITIVE (A) NEGATIVE Final   Influenza B by PCR NEGATIVE NEGATIVE Final    Comment: (NOTE) The Xpert Xpress SARS-CoV-2/FLU/RSV plus assay is intended as an aid in the diagnosis of influenza from Nasopharyngeal swab specimens and should not be used as a sole basis for treatment. Nasal washings and aspirates are unacceptable for Xpert Xpress SARS-CoV-2/FLU/RSV testing.  Fact Sheet for Patients: BloggerCourse.com  Fact Sheet for Healthcare Providers: SeriousBroker.it  This test is not yet approved or cleared by the [place] FDA and has been authorized for detection  and/or diagnosis of SARS-CoV-2 by FDA under an Emergency Use Authorization (EUA). This EUA will remain in effect (meaning this test can be used) for the duration of the COVID-19 declaration under Section 564(b)(1) of the Act, 21 U.S.C. section 360bbb-3(b)(1), unless the authorization is terminated or revoked.     Resp Syncytial Virus by PCR NEGATIVE NEGATIVE Final    Comment: (NOTE) Fact Sheet for Patients: BloggerCourse.com  Fact Sheet for Healthcare Providers: SeriousBroker.it  This test is not yet approved or cleared by the [place] FDA and has been authorized for detection and/or diagnosis of SARS-CoV-2 by FDA under an Emergency Use Authorization (EUA). This EUA will remain in effect (meaning this test can be used) for the duration of the COVID-19 declaration under Section 564(b)(1) of the Act, 21 U.S.C. section 360bbb-3(b)(1), unless the authorization is terminated or revoked.  Performed at [facility], [address] [place], [place] [address]   Gastrointestinal Panel by PCR , Stool  Status: None   Collection Time: 05/09/23 10:45 PM   Specimen: Anterior Nasal Swab; Stool  Result Value Ref Range Status   Campylobacter species NOT DETECTED NOT DETECTED Final   Plesimonas shigelloides NOT DETECTED NOT DETECTED Final   Salmonella species NOT DETECTED NOT DETECTED Final   Yersinia enterocolitica NOT DETECTED NOT DETECTED Final   Vibrio species NOT DETECTED NOT DETECTED Final   Vibrio cholerae NOT DETECTED NOT DETECTED Final   Enteroaggregative E coli (EAEC) NOT DETECTED NOT DETECTED Final   Enteropathogenic E coli (EPEC) NOT DETECTED NOT DETECTED Final   Enterotoxigenic E coli (ETEC) NOT DETECTED NOT DETECTED Final   Shiga like toxin producing E coli (STEC) NOT DETECTED NOT DETECTED Final   Shigella/Enteroinvasive E coli (EIEC) NOT DETECTED NOT DETECTED Final   Cryptosporidium NOT DETECTED NOT  DETECTED Final   Cyclospora cayetanensis NOT DETECTED NOT DETECTED Final   Entamoeba histolytica NOT DETECTED NOT DETECTED Final   Giardia lamblia NOT DETECTED NOT DETECTED Final   Adenovirus F40/41 NOT DETECTED NOT DETECTED Final   Astrovirus NOT DETECTED NOT DETECTED Final   Norovirus GI/GII NOT DETECTED NOT DETECTED Final   Rotavirus A NOT DETECTED NOT DETECTED Final   Sapovirus (I, II, IV, and V) NOT DETECTED NOT DETECTED Final    Comment: Performed at [facility], [address] [place], [place] [address]  MRSA Next Gen by PCR, Nasal     Status: Abnormal   Collection Time: 05/10/23  1:09 AM   Specimen: Nasal Mucosa; Nasal Swab  Result Value Ref Range Status   MRSA by PCR Next Gen DETECTED (A) NOT DETECTED Final    Comment: (NOTE) The GeneXpert MRSA Assay (FDA approved for NASAL specimens only), is one component of a comprehensive MRSA colonization surveillance program. It is not intended to diagnose MRSA infection nor to guide or monitor treatment for MRSA infections. Test performance is not FDA approved in patients less than 57 years old. Performed at [facility], [address] [place], [place] [address]   Respiratory (~20 pathogens) panel by PCR     Status: Abnormal   Collection Time: 05/10/23 12:46 PM   Specimen: Nasopharyngeal Swab; Respiratory  Result Value Ref Range Status   Adenovirus NOT DETECTED NOT DETECTED Final   Coronavirus 229E NOT DETECTED NOT DETECTED Final    Comment: (NOTE) The Coronavirus on the Respiratory Panel, DOES NOT test for the novel  Coronavirus (2019 nCoV)    Coronavirus HKU1 NOT DETECTED NOT DETECTED Final   Coronavirus NL63 NOT DETECTED NOT DETECTED Final   Coronavirus OC43 NOT DETECTED NOT DETECTED Final   Metapneumovirus NOT DETECTED NOT DETECTED Final   Rhinovirus / Enterovirus NOT DETECTED NOT DETECTED Final   Influenza A H1 2009 DETECTED (A) NOT DETECTED Final   Influenza B NOT DETECTED NOT DETECTED  Final   Parainfluenza Virus 1 NOT DETECTED NOT DETECTED Final   Parainfluenza Virus 2 NOT DETECTED NOT DETECTED Final   Parainfluenza Virus 3 NOT DETECTED NOT DETECTED Final   Parainfluenza Virus 4 NOT DETECTED NOT DETECTED Final   Respiratory Syncytial Virus NOT DETECTED NOT DETECTED Final   Bordetella pertussis NOT DETECTED NOT DETECTED Final   Bordetella Parapertussis NOT DETECTED NOT DETECTED Final   Chlamydophila pneumoniae NOT DETECTED NOT DETECTED Final   Mycoplasma pneumoniae NOT DETECTED NOT DETECTED Final    Comment: Performed at [facility], [address], [place], [place] [address]    Studies/Results: ECHOCARDIOGRAM COMPLETE Result Date: 05/11/2023  ECHOCARDIOGRAM REPORT   Patient Name:   [name] Date of Exam: 05/11/2023 Medical Rec #:  [id number]        Height:       64.0 in Accession #:    [id number]       Weight:       104.7 lb Date of Birth:  [birth year]        BSA:          1.486 m Patient Age:    47 years         BP:           99/70 mmHg Patient Gender: F                HR:           60 bpm. Exam Location:  Inpatient Procedure: 2D Echo, Cardiac Doppler and Color Doppler Indications:    Bacteremia  History:        Patient has prior history of Echocardiogram examinations, most                 recent 12/20/2022. Prior CABG, Arrythmias:Cardiac Arrest; Risk                 Factors:Former Smoker. FLU positive. Breast cancer. Septic                 shock.  Sonographer:    Sheralyn Boatman RDCS Referring Phys: [id number] Martina Sinner  Sonographer Comments: Technically difficult study due to poor echo windows and no apical window. Image acquisition challenging due to breast implants. IMPRESSIONS  1. Left ventricular ejection fraction, by estimation, is 60 to 65%. The left ventricle has normal function. The left ventricle has no regional wall motion abnormalities. Left ventricular diastolic parameters were normal.  2. Right ventricular systolic function is normal. The right  ventricular size is normal. There is normal pulmonary artery systolic pressure. The estimated right ventricular systolic pressure is 27.7 mmHg.  3. The mitral valve is normal in structure. No evidence of mitral valve regurgitation. No evidence of mitral stenosis.  4. The aortic valve is tricuspid. Aortic valve regurgitation is not visualized. No aortic stenosis is present.  5. The inferior vena cava is normal in size with <50% respiratory variability, suggesting right atrial pressure of 8 mmHg. FINDINGS  Left Ventricle: Left ventricular ejection fraction, by estimation, is 60 to 65%. The left ventricle has normal function. The left ventricle has no regional wall motion abnormalities. The left ventricular internal cavity size was normal in size. There is  no left ventricular hypertrophy. Left ventricular diastolic parameters were normal. Right Ventricle: The right ventricular size is normal. No increase in right ventricular wall thickness. Right ventricular systolic function is normal. There is normal pulmonary artery systolic pressure. The tricuspid regurgitant velocity is 2.22 m/s, and  with an assumed right atrial pressure of 8 mmHg, the estimated right ventricular systolic pressure is 27.7 mmHg. Left Atrium: Left atrial size was normal in size. Right Atrium: Right atrial size was normal in size. Pericardium: There is no evidence of pericardial effusion. Mitral Valve: The mitral valve is normal in structure. No evidence of mitral valve regurgitation. No evidence of mitral valve stenosis. Tricuspid Valve: The tricuspid valve is normal in structure. Tricuspid valve regurgitation is mild. Aortic Valve: The aortic valve is tricuspid. Aortic valve regurgitation is not visualized. No aortic stenosis is present. Pulmonic Valve: The pulmonic valve was normal in structure. Pulmonic valve regurgitation is not visualized. Aorta: The  aortic root is normal in size and structure. Venous: The inferior vena cava is normal in size  with less than 50% respiratory variability, suggesting right atrial pressure of 8 mmHg. IAS/Shunts: No atrial level shunt detected by color flow Doppler.  LEFT VENTRICLE PLAX 2D LVIDd:         3.80 cm     Diastology LVIDs:         2.40 cm     LV e' medial:    7.62 cm/s LV PW:         0.70 cm     LV E/e' medial:  6.2 LV IVS:        0.80 cm     LV e' lateral:   10.00 cm/s LVOT diam:     1.80 cm     LV E/e' lateral: 4.8 LV SV:         38 LV SV Index:   25 LVOT Area:     2.54 cm  LV Volumes (MOD) LV vol d, MOD A4C: 64.5 ml LV vol s, MOD A4C: 26.7 ml LV SV MOD A4C:     64.5 ml RIGHT VENTRICLE             IVC RV S prime:     13.10 cm/s  IVC diam: 1.50 cm TAPSE (M-mode): 2.1 cm LEFT ATRIUM           Index        RIGHT ATRIUM          Index LA diam:      2.00 cm 1.35 cm/m   RA Area:     9.21 cm LA Vol (A4C): 22.5 ml 15.14 ml/m  RA Volume:   18.40 ml 12.38 ml/m  AORTIC VALVE LVOT Vmax:   71.60 cm/s LVOT Vmean:  42.400 cm/s LVOT VTI:    0.148 m  AORTA Ao Root diam: 2.90 cm MITRAL VALVE               TRICUSPID VALVE MV Area (PHT): 2.73 cm    TR Peak grad:   19.7 mmHg MV Decel Time: 278 msec    TR Vmax:        222.00 cm/s MV E velocity: 47.60 cm/s MV A velocity: 41.10 cm/s  SHUNTS MV E/A ratio:  1.16        Systemic VTI:  0.15 m                            Systemic Diam: 1.80 cm Dalton McleanMD Electronically signed by Wilfred Lacy Signature Date/Time: 05/11/2023/8:52:09 AM    Final    CT ABDOMEN PELVIS W CONTRAST Result Date: 05/09/2023 CLINICAL DATA:  Provided history of sepsis. Patient reports weakness and chills. Radiologic records indicates history of metastatic breast cancer status post partial colectomy. EXAM: CT ABDOMEN AND PELVIS WITH CONTRAST TECHNIQUE: Multidetector CT imaging of the abdomen and pelvis was performed using the standard protocol following bolus administration of intravenous contrast. RADIATION DOSE REDUCTION: This exam was performed according to the departmental dose-optimization program  which includes automated exposure control, adjustment of the mA and/or kV according to patient size and/or use of iterative reconstruction technique. CONTRAST:  OMNIPAQUE IOHEXOL 300 MG/ML  SOLN COMPARISON:  CT 04/13/2023, abscess injection 2 days ago reviewed FINDINGS: Lower chest: Scarring at the left lung base. Hepatobiliary: No focal liver abnormality. Possible small gallstone series 2, image 30. No abnormal gallbladder distention or pericholecystic inflammation. Pancreas:  No ductal dilatation or inflammation. Spleen: Normal in size without focal abnormality. Adrenals/Urinary Tract: No adrenal nodule. Slight bilateral renal collecting system prominence, left greater than right. Equivocal enhancement of the left renal pelvis. No renal calculi. Unremarkable urinary bladder, no bladder wall thickening. Stomach/Bowel: Bowel assessment is limited in the absence of enteric contrast and paucity of intra-abdominal fat. Decompressed stomach. Subtotal colectomy. Small amount of fluid within the rectum, previous contrast in the rectum has cleared. Left abdominal ileostomy. Suggestion of mild diffuse small bowel wall thickening. No bowel pneumatosis. No abnormal distension or obstruction. Vascular/Lymphatic: Normal caliber abdominal aorta. Patent portal and splenic veins. Narrowing of the left renal vein as it courses under the SMA is again seen. No gross abdominopelvic adenopathy. Reproductive: Again seen uterine fibroids. Small amount of free fluid in the right adnexa/pelvis is unchanged from prior exam. Other: Small amount of stranding in the right mid abdomen, similar to prior. Small amount of free fluid in the right pelvis and adnexa which was present on prior exam. No free air. Musculoskeletal: No free air, free fluid, or intra-abdominal fluid collection. Left breast implant. Right mastectomy. IMPRESSION: 1. Suggestion of mild diffuse small bowel wall thickening which can be seen with enteritis. No obstruction.  2. Left-sided ileostomy post colectomy. 3. Small amount of free fluid in the right pelvis is well as adnexa, unchanged from prior exam. 4. Chronic uterine fibroids. Electronically Signed   By: Narda Rutherford M.D.   On: 05/09/2023 21:40   DG Chest Port 1 View Result Date: 05/09/2023 CLINICAL DATA:  Hypertension, weakness, chills with nausea and vomiting, concern for sepsis EXAM: PORTABLE CHEST 1 VIEW COMPARISON:  02/11/2023 FINDINGS: Normal heart size and vascularity. Left breast implant noted over the left hemithorax. Lungs remain clear. No focal pneumonia, collapse or consolidation. Negative for edema, large effusion or pneumothorax. Trachea midline. Right axillary clips present. Right IJ tunneled PICC line tip SVC RA junction. Overall stable exam. IMPRESSION: No acute chest process. Electronically Signed   By: Judie Petit.  Shick M.D.   On: 05/09/2023 16:45      Assessment/Plan:  INTERVAL HISTORY: pt currently off levophed this am   Principal Problem:   Septic shock (HCC) Active Problems:   Hypotension   On total parenteral nutrition (TPN)   Influenza A   Positive blood culture   Crohn's disease (HCC)   H/O abdominal abscess   Weight loss    [name] is a 48 y.o. female with a quite complicated past medical history including Crohn's disease with recurrent small bowel obstruction and ischemic bowel who required a partial colectomy this September and a course was complicated by intra-abdominal abscesses requiring drainage and antibiotics and short gut syndrome requiring TPN.  She lost that great deal of weight during this time.  From September to present date.  She had recently had drain removed by interventional radiology for intra-abdominal infection.  Shortly thereafter she began developing flulike symptoms and respiratory symptoms which she believes she had contracted from her son.  She was having fevers chills and rigors and 1 episode of emesis when her home health aide came to  see her and noticed that her pulse was in the 160s directed to come to the ER in the ER she was found to be hypotensive initially refractive to IV fluids and albumin started on vasopressin.  She was found to be positive for influenza A H1 2009 variant.  Blood cultures have been taken peripherally and also from report--latter should not have been done  CT abdomen and pelvis is benign  #1 Influenza  --continue tamiflu and droplet precautions  #2 ? Bacteremia  My read on her culture data so far is that based on 2 BCID's she is with a strep species in one peripheral culture and a staph epidermidis in a 2nd peripheral culture along with a staph epidermidis from her port.  I would read this as being cw contamination of peripheral sticks and colonization of port  Instead we are still covering her for daptomycin until we get more clarity phenotypically from the culture data.  Certainly if it becomes evident that she does not fact have a true bloodstream infection the port will need to be removed and she will need a catheter holiday.  #3 Low BP: likely from weight loss, and hypovolemia. She is starting midodrine and is now also agreeable to steroids if CCM wishes to proceed with them   I have personally spent 54 minutes involved in face-to-face and non-face-to-face activities for this patient on the day of the visit. Professional time spent includes the following activities: Preparing to see the patient (review of tests), Obtaining and/or reviewing separately obtained history (admission/discharge record), Performing a medically appropriate examination and/or evaluation , Ordering medications/tests/procedures, referring and communicating with other health care professionals, Documenting clinical information in the EMR, Independently interpreting results (not separately reported), Communicating results to the patient/family/caregiver, Counseling and educating the patient/family/caregiver and Care  coordination (not separately reported).   Evaluation of the patient requires complex antimicrobial therapy evaluation, counseling , isolation needs to reduce disease transmission and risk assessment and mitigation.    My partner Dr. Elinor Parkinson is available this weekend and will follow-up on culture data and can certainly see the patient if needed.  I will be back on Monday.   LOS: 2 days   Acey Lav 05/11/2023, 11:10 AM

## 2023-05-11 NOTE — Progress Notes (Signed)
      INFECTIOUS DISEASE ATTENDING ADDENDUM:   Date: 05/11/2023  Patient name: [name]  Medical record number: [id number]  Date of birth: November 20, 1975   I am in dialogue with the Microbiology team  Based on the labelling of the specimens it is difficult to understand what is going on  This is what I can gather so far  #1 Blood culture from port (SHOULD NOT have been taken at 1625 is growing a Staph epidermidis. Given this is from a port it is very hard to know whether this is a colonizer of port or true pathogen. This I believe is the one that is + on BCID for MRSE  #2 AC fossa Left side collected at 1640  [id number] is growing GPC pairs in anaerobic only but not yet ID.  #3 "Fungal culture" collected at 1640:  Accession number [id number] peripheral  has the same time stamp as #2 and appears to be taken from the same "stick' is growing Streptococcus mitis/oralis and Staph epidermidis.   So my interpretation is that we have   TWO blood cultures  ONE collected from a port with MRSE  ONE collected form Left AC that is growing Step mitis/oralis and Staph epi from fungal culture incubated and another with GPC pairs from the bacterial culture  I have asked hte micro lab to run sensis on everything esp the staph epidermidis speices  If the staph epidermidis species grows from the port and the peripheral culture then the port should be removed given her presentation with shock requiring pressors  ONe may want to consider getting rid of the port regardless but I hate to force removal of ports if not needed. In this siutation where  blood cultures were not properly collected we are left with imperfect data   Acey Lav 05/11/2023, 5:03 PM

## 2023-05-12 DIAGNOSIS — A419 Sepsis, unspecified organism: Secondary | ICD-10-CM | POA: Diagnosis not present

## 2023-05-12 DIAGNOSIS — R6521 Severe sepsis with septic shock: Secondary | ICD-10-CM | POA: Diagnosis not present

## 2023-05-12 LAB — COMPREHENSIVE METABOLIC PANEL
ALT: 54 U/L — ABNORMAL HIGH (ref 0–44)
AST: 28 U/L (ref 15–41)
Albumin: 3.1 g/dL — ABNORMAL LOW (ref 3.5–5.0)
Alkaline Phosphatase: 164 U/L — ABNORMAL HIGH (ref 38–126)
Anion gap: 11 (ref 5–15)
BUN: 22 mg/dL — ABNORMAL HIGH (ref 6–20)
CO2: 27 mmol/L (ref 22–32)
Calcium: 9.4 mg/dL (ref 8.9–10.3)
Chloride: 100 mmol/L (ref 98–111)
Creatinine, Ser: 0.8 mg/dL (ref 0.44–1.00)
GFR, Estimated: >60 mL/min (ref >60)
Glucose, Bld: 130 mg/dL — ABNORMAL HIGH (ref 70–99)
Potassium: 4 mmol/L (ref 3.5–5.1)
Sodium: 138 mmol/L (ref 135–145)
Total Bilirubin: 0.8 mg/dL (ref 0.0–1.2)
Total Protein: 7.3 g/dL (ref 6.5–8.1)

## 2023-05-12 LAB — CULTURE, BLOOD (ROUTINE X 2)

## 2023-05-12 LAB — PHOSPHORUS: Phosphorus: 2.4 mg/dL — ABNORMAL LOW (ref 2.5–4.6)

## 2023-05-12 LAB — GLUCOSE, CAPILLARY
Glucose-Capillary: 102 mg/dL — ABNORMAL HIGH (ref 70–99)
Glucose-Capillary: 111 mg/dL — ABNORMAL HIGH (ref 70–99)
Glucose-Capillary: 131 mg/dL — ABNORMAL HIGH (ref 70–99)
Glucose-Capillary: 137 mg/dL — ABNORMAL HIGH (ref 70–99)

## 2023-05-12 LAB — PROCALCITONIN: Procalcitonin: 53.99 ng/mL

## 2023-05-12 LAB — MAGNESIUM: Magnesium: 2.2 mg/dL (ref 1.7–2.4)

## 2023-05-12 MED ORDER — HYDROCORTISONE SOD SUC (PF) 100 MG IJ SOLR
100.0000 mg | Freq: Once | INTRAMUSCULAR | Status: AC
Start: 2023-05-12 — End: 2023-05-12
  Administered 2023-05-12: 100 mg via INTRAVENOUS
  Filled 2023-05-12: qty 2

## 2023-05-12 MED ORDER — HYDROCORTISONE SOD SUC (PF) 100 MG IJ SOLR
100.0000 mg | Freq: Every day | INTRAMUSCULAR | Status: DC
Start: 2023-05-13 — End: 2023-05-15
  Administered 2023-05-13 – 2023-05-15 (×3): 100 mg via INTRAVENOUS
  Filled 2023-05-12 (×3): qty 2

## 2023-05-12 MED ORDER — SODIUM PHOSPHATES 45 MMOLE/15ML IV SOLN
10.0000 mmol | Freq: Once | INTRAVENOUS | Status: AC
  Administered 2023-05-12: 10 mmol via INTRAVENOUS
  Filled 2023-05-12: qty 3.33

## 2023-05-12 MED ORDER — OSELTAMIVIR PHOSPHATE 75 MG PO CAPS
75.0000 mg | ORAL_CAPSULE | Freq: Two times a day (BID) | ORAL | Status: AC
  Administered 2023-05-12 – 2023-05-14 (×5): 75 mg via ORAL
  Filled 2023-05-12 (×6): qty 1

## 2023-05-12 MED ORDER — DEXTROSE IN LACTATED RINGERS 5 % IV SOLN: INTRAVENOUS | Status: AC

## 2023-05-12 NOTE — Progress Notes (Signed)
ID brief note Results for orders placed or performed during the hospital encounter of 05/09/23  Blood Culture (routine x 2)     Status: Abnormal   Collection Time: 05/09/23  4:25 PM   Specimen: BLOOD  Result Value Ref Range Status   Specimen Description   Final    BLOOD PORTA CATH Performed at [facility], [address] [place], [place] [address]    Special Requests   Final    BOTTLES DRAWN AEROBIC AND ANAEROBIC Blood Culture results may not be optimal due to an inadequate volume of blood received in culture bottles Performed at [facility], [address] [place], [place] [address]    Culture  Setup Time   Final    GRAM POSITIVE COCCI IN BOTH AEROBIC AND ANAEROBIC BOTTLES CRITICAL RESULT CALLED TO, READ BACK BY AND VERIFIED WITH: C SHADE,PHARMD@[id number]  05/10/23 MK Performed at [facility], [address], [place], [place] [address]    Culture STAPHYLOCOCCUS EPIDERMIDIS (A)  Final   Report Status 05/12/2023 FINAL  Final   Organism ID, Bacteria STAPHYLOCOCCUS EPIDERMIDIS  Final      Susceptibility   Staphylococcus epidermidis - MIC*    CIPROFLOXACIN 4 RESISTANT Resistant     ERYTHROMYCIN >=8 RESISTANT Resistant     GENTAMICIN >=16 RESISTANT Resistant     OXACILLIN >=4 RESISTANT Resistant     TETRACYCLINE 2 SENSITIVE Sensitive     VANCOMYCIN 1 SENSITIVE Sensitive     TRIMETH/SULFA 40 SENSITIVE Sensitive     CLINDAMYCIN <=0.25 SENSITIVE Sensitive     RIFAMPIN <=0.5 SENSITIVE Sensitive     Inducible Clindamycin NEGATIVE Sensitive     * STAPHYLOCOCCUS EPIDERMIDIS  Blood Culture ID Panel (Reflexed)     Status: Abnormal   Collection Time: 05/09/23  4:25 PM  Result Value Ref Range Status   Enterococcus faecalis NOT DETECTED NOT DETECTED Final   Enterococcus Faecium NOT DETECTED NOT DETECTED Final   Listeria monocytogenes NOT DETECTED NOT DETECTED Final   Staphylococcus species DETECTED (A) NOT DETECTED Final    Comment: CRITICAL RESULT  CALLED TO, READ BACK BY AND VERIFIED WITH: C SHADE,PHARMD@0718  05/10/23 MK    Staphylococcus aureus (BCID) NOT DETECTED NOT DETECTED Final   Staphylococcus epidermidis DETECTED (A) NOT DETECTED Final    Comment: Methicillin (oxacillin) resistant coagulase negative staphylococcus. Possible blood culture contaminant (unless isolated from more than one blood culture draw or clinical case suggests pathogenicity). No antibiotic treatment is indicated for blood  culture contaminants. CRITICAL RESULT CALLED TO, READ BACK BY AND VERIFIED WITH: C SHADE,PHARMD@0718  05/10/23 MK    Staphylococcus lugdunensis NOT DETECTED NOT DETECTED Final   Streptococcus species NOT DETECTED NOT DETECTED Final   Streptococcus agalactiae NOT DETECTED NOT DETECTED Final   Streptococcus pneumoniae NOT DETECTED NOT DETECTED Final   Streptococcus pyogenes NOT DETECTED NOT DETECTED Final   A.calcoaceticus-baumannii NOT DETECTED NOT DETECTED Final   Bacteroides fragilis NOT DETECTED NOT DETECTED Final   Enterobacterales NOT DETECTED NOT DETECTED Final   Enterobacter cloacae complex NOT DETECTED NOT DETECTED Final   Escherichia coli NOT DETECTED NOT DETECTED Final   Klebsiella aerogenes NOT DETECTED NOT DETECTED Final   Klebsiella oxytoca NOT DETECTED NOT DETECTED Final   Klebsiella pneumoniae NOT DETECTED NOT DETECTED Final   Proteus species NOT DETECTED NOT DETECTED Final   Salmonella species NOT DETECTED NOT DETECTED Final   Serratia marcescens NOT DETECTED NOT DETECTED Final   Haemophilus influenzae NOT DETECTED NOT DETECTED Final   Neisseria  meningitidis NOT DETECTED NOT DETECTED Final   Pseudomonas aeruginosa NOT DETECTED NOT DETECTED Final   Stenotrophomonas maltophilia NOT DETECTED NOT DETECTED Final   Candida albicans NOT DETECTED NOT DETECTED Final   Candida auris NOT DETECTED NOT DETECTED Final   Candida glabrata NOT DETECTED NOT DETECTED Final   Candida krusei NOT DETECTED NOT DETECTED Final   Candida  parapsilosis NOT DETECTED NOT DETECTED Final   Candida tropicalis NOT DETECTED NOT DETECTED Final   Cryptococcus neoformans/gattii NOT DETECTED NOT DETECTED Final   Methicillin resistance mecA/C DETECTED (A) NOT DETECTED Final    Comment: CRITICAL RESULT CALLED TO, READ BACK BY AND VERIFIED WITH: C SHADE,PHARMD@0718  05/10/23 MK Performed at [facility], [address], [place], [place] [address]   Blood Culture (routine x 2)     Status: None (Preliminary result)   Collection Time: 05/09/23  4:40 PM   Specimen: BLOOD  Result Value Ref Range Status   Specimen Description   Final    BLOOD LEFT ANTECUBITAL Performed at [facility], [address], [place], [place] [address]    Special Requests   Final    BOTTLES DRAWN AEROBIC AND ANAEROBIC Blood Culture results may not be optimal due to an inadequate volume of blood received in culture bottles Performed at [facility], [address], [place], [place] [address]    Culture  Setup Time   Final    GRAM POSITIVE COCCI IN CHAINS ANAEROBIC BOTTLE ONLY CRITICAL RESULT CALLED TO, READ BACK BY AND VERIFIED WITH: PHARMD CHRISTINE SHADE ON 05/10/23 @ 1239 BY DRT    Culture   Final    CULTURE REINCUBATED FOR BETTER GROWTH Performed at [facility], [address] [place] [address], [place], [place] [address]    Report Status PENDING  Incomplete  Fungus culture, blood     Status: Abnormal (Preliminary result)   Collection Time: 05/09/23  4:40 PM   Specimen: BLOOD  Result Value Ref Range Status   Specimen Description   Final    BLOOD LEFT ANTECUBITAL Performed at [facility], [address] [place], [place] [address]    Special Requests   Final    NONE Performed at [facility], [address] [place], [place] [address]    Fungal Smear   Final    GRAM POSITIVE COCCI IN CLUSTERS AEROBIC BOTTLE ONLY GRAM POSITIVE COCCI IN CHAINS ANAEROBIC BOTTLE ONLY CRITICAL VALUE NOTED.  VALUE  IS CONSISTENT WITH PREVIOUSLY REPORTED AND CALLED VALUE.    Culture (A)  Final    STREPTOCOCCUS MITIS/ORALIS CONFIRMATION OF SUSCEPTIBILITIES IN PROGRESS STAPHYLOCOCCUS EPIDERMIDIS SUSCEPTIBILITIES PERFORMED ON PREVIOUS CULTURE WITHIN THE LAST 5 DAYS. Performed at [facility], [address] [place], [place] [address]    Report Status PENDING  Incomplete  Blood Culture ID Panel (Reflexed)     Status: Abnormal   Collection Time: 05/09/23  4:40 PM  Result Value Ref Range Status   Enterococcus faecalis NOT DETECTED NOT DETECTED Final   Enterococcus Faecium NOT DETECTED NOT DETECTED Final   Listeria monocytogenes NOT DETECTED NOT DETECTED Final   Staphylococcus species NOT DETECTED NOT DETECTED Final   Staphylococcus aureus (BCID) NOT DETECTED NOT DETECTED Final   Staphylococcus epidermidis NOT DETECTED NOT DETECTED Final   Staphylococcus lugdunensis NOT DETECTED NOT DETECTED Final   Streptococcus species DETECTED (A) NOT DETECTED Final    Comment: Not Enterococcus species, Streptococcus agalactiae, Streptococcus pyogenes, or Streptococcus pneumoniae. CRITICAL RESULT CALLED TO,  READ BACK BY AND VERIFIED WITH: PHARMD CHRISTINE SHADE ON 05/10/23 @ 1239 BY DRT    Streptococcus agalactiae NOT DETECTED NOT DETECTED Final   Streptococcus pneumoniae NOT DETECTED NOT DETECTED Final   Streptococcus pyogenes NOT DETECTED NOT DETECTED Final   A.calcoaceticus-baumannii NOT DETECTED NOT DETECTED Final   Bacteroides fragilis NOT DETECTED NOT DETECTED Final   Enterobacterales NOT DETECTED NOT DETECTED Final   Enterobacter cloacae complex NOT DETECTED NOT DETECTED Final   Escherichia coli NOT DETECTED NOT DETECTED Final   Klebsiella aerogenes NOT DETECTED NOT DETECTED Final   Klebsiella oxytoca NOT DETECTED NOT DETECTED Final   Klebsiella pneumoniae NOT DETECTED NOT DETECTED Final   Proteus species NOT DETECTED NOT DETECTED Final   Salmonella species NOT DETECTED NOT DETECTED Final    Serratia marcescens NOT DETECTED NOT DETECTED Final   Haemophilus influenzae NOT DETECTED NOT DETECTED Final   Neisseria meningitidis NOT DETECTED NOT DETECTED Final   Pseudomonas aeruginosa NOT DETECTED NOT DETECTED Final   Stenotrophomonas maltophilia NOT DETECTED NOT DETECTED Final   Candida albicans NOT DETECTED NOT DETECTED Final   Candida auris NOT DETECTED NOT DETECTED Final   Candida glabrata NOT DETECTED NOT DETECTED Final   Candida krusei NOT DETECTED NOT DETECTED Final   Candida parapsilosis NOT DETECTED NOT DETECTED Final   Candida tropicalis NOT DETECTED NOT DETECTED Final   Cryptococcus neoformans/gattii NOT DETECTED NOT DETECTED Final    Comment: Performed at [facility], [address], [place], [place] [address]  Resp panel by RT-PCR (RSV, Flu A&B, Covid) Anterior Nasal Swab     Status: Abnormal   Collection Time: 05/09/23 10:45 PM   Specimen: Anterior Nasal Swab  Result Value Ref Range Status   SARS Coronavirus 2 by RT PCR NEGATIVE NEGATIVE Final    Comment: (NOTE) SARS-CoV-2 target nucleic acids are NOT DETECTED.  The SARS-CoV-2 RNA is generally detectable in upper respiratory specimens during the acute phase of infection. The lowest concentration of SARS-CoV-2 viral copies this assay can detect is 138 copies/mL. A negative result does not preclude SARS-Cov-2 infection and should not be used as the sole basis for treatment or other patient management decisions. A negative result may occur with  improper specimen collection/handling, submission of specimen other than nasopharyngeal swab, presence of viral mutation(s) within the areas targeted by this assay, and inadequate number of viral copies(<138 copies/mL). A negative result must be combined with clinical observations, patient history, and epidemiological information. The expected result is Negative.  Fact Sheet for Patients:  BloggerCourse.com  Fact Sheet for Healthcare  Providers:  SeriousBroker.it  This test is no t yet approved or cleared by the [place] FDA and  has been authorized for detection and/or diagnosis of SARS-CoV-2 by FDA under an Emergency Use Authorization (EUA). This EUA will remain  in effect (meaning this test can be used) for the duration of the COVID-19 declaration under Section 564(b)(1) of the Act, 21 U.S.C.section 360bbb-3(b)(1), unless the authorization is terminated  or revoked sooner.       Influenza A by PCR POSITIVE (A) NEGATIVE Final   Influenza B by PCR NEGATIVE NEGATIVE Final    Comment: (NOTE) The Xpert Xpress SARS-CoV-2/FLU/RSV plus assay is intended as an aid in the diagnosis of influenza from Nasopharyngeal swab specimens and should not be used as a sole basis for treatment. Nasal washings and aspirates are unacceptable for Xpert Xpress SARS-CoV-2/FLU/RSV testing.  Fact Sheet for Patients: BloggerCourse.com  Fact Sheet for Healthcare Providers: SeriousBroker.it  This  test is not yet approved or cleared by the [place] and has been authorized for detection and/or diagnosis of SARS-CoV-2 by FDA under an Emergency Use Authorization (EUA). This EUA will remain in effect (meaning this test can be used) for the duration of the COVID-19 declaration under Section 564(b)(1) of the Act, 21 U.S.C. section 360bbb-3(b)(1), unless the authorization is terminated or revoked.     Resp Syncytial Virus by PCR NEGATIVE NEGATIVE Final    Comment: (NOTE) Fact Sheet for Patients: BloggerCourse.com  Fact Sheet for Healthcare Providers: SeriousBroker.it  This test is not yet approved or cleared by the [place] FDA and has been authorized for detection and/or diagnosis of SARS-CoV-2 by FDA under an Emergency Use Authorization (EUA). This EUA will remain in effect (meaning this test  can be used) for the duration of the COVID-19 declaration under Section 564(b)(1) of the Act, 21 U.S.C. section 360bbb-3(b)(1), unless the authorization is terminated or revoked.  Performed at [facility], [address] [place], [place] [address]   Gastrointestinal Panel by PCR , Stool     Status: None   Collection Time: 05/09/23 10:45 PM   Specimen: Anterior Nasal Swab; Stool  Result Value Ref Range Status   Campylobacter species NOT DETECTED NOT DETECTED Final   Plesimonas shigelloides NOT DETECTED NOT DETECTED Final   Salmonella species NOT DETECTED NOT DETECTED Final   Yersinia enterocolitica NOT DETECTED NOT DETECTED Final   Vibrio species NOT DETECTED NOT DETECTED Final   Vibrio cholerae NOT DETECTED NOT DETECTED Final   Enteroaggregative E coli (EAEC) NOT DETECTED NOT DETECTED Final   Enteropathogenic E coli (EPEC) NOT DETECTED NOT DETECTED Final   Enterotoxigenic E coli (ETEC) NOT DETECTED NOT DETECTED Final   Shiga like toxin producing E coli (STEC) NOT DETECTED NOT DETECTED Final   Shigella/Enteroinvasive E coli (EIEC) NOT DETECTED NOT DETECTED Final   Cryptosporidium NOT DETECTED NOT DETECTED Final   Cyclospora cayetanensis NOT DETECTED NOT DETECTED Final   Entamoeba histolytica NOT DETECTED NOT DETECTED Final   Giardia lamblia NOT DETECTED NOT DETECTED Final   Adenovirus F40/41 NOT DETECTED NOT DETECTED Final   Astrovirus NOT DETECTED NOT DETECTED Final   Norovirus GI/GII NOT DETECTED NOT DETECTED Final   Rotavirus A NOT DETECTED NOT DETECTED Final   Sapovirus (I, II, IV, and V) NOT DETECTED NOT DETECTED Final    Comment: Performed at [facility], [address] [place], [place] [address]  MRSA Next Gen by PCR, Nasal     Status: Abnormal   Collection Time: 05/10/23  1:09 AM   Specimen: Nasal Mucosa; Nasal Swab  Result Value Ref Range Status   MRSA by PCR Next Gen DETECTED (A) NOT DETECTED Final    Comment: (NOTE) The GeneXpert MRSA  Assay (FDA approved for NASAL specimens only), is one component of a comprehensive MRSA colonization surveillance program. It is not intended to diagnose MRSA infection nor to guide or monitor treatment for MRSA infections. Test performance is not FDA approved in patients less than 27 years old. Performed at [facility], [address], [place], [place] [address]   Respiratory (~20 pathogens) panel by PCR     Status: Abnormal   Collection Time: 05/10/23 12:46 PM   Specimen: Nasopharyngeal Swab; Respiratory  Result Value Ref Range Status   Adenovirus NOT DETECTED NOT DETECTED Final   Coronavirus 229E NOT DETECTED NOT DETECTED Final    Comment: (NOTE) The Coronavirus on the Respiratory Panel, DOES  NOT test for the novel  Coronavirus (2019 nCoV)    Coronavirus HKU1 NOT DETECTED NOT DETECTED Final   Coronavirus NL63 NOT DETECTED NOT DETECTED Final   Coronavirus OC43 NOT DETECTED NOT DETECTED Final   Metapneumovirus NOT DETECTED NOT DETECTED Final   Rhinovirus / Enterovirus NOT DETECTED NOT DETECTED Final   Influenza A H1 2009 DETECTED (A) NOT DETECTED Final   Influenza B NOT DETECTED NOT DETECTED Final   Parainfluenza Virus 1 NOT DETECTED NOT DETECTED Final   Parainfluenza Virus 2 NOT DETECTED NOT DETECTED Final   Parainfluenza Virus 3 NOT DETECTED NOT DETECTED Final   Parainfluenza Virus 4 NOT DETECTED NOT DETECTED Final   Respiratory Syncytial Virus NOT DETECTED NOT DETECTED Final   Bordetella pertussis NOT DETECTED NOT DETECTED Final   Bordetella Parapertussis NOT DETECTED NOT DETECTED Final   Chlamydophila pneumoniae NOT DETECTED NOT DETECTED Final   Mycoplasma pneumoniae NOT DETECTED NOT DETECTED Final    Comment: Performed at [facility], [address] [place], [place] [address]   *Note: Due to a large number of results and/or encounters for the requested time period, some results have not been displayed. A complete set of results can be found in  Results Review.   No new updates from cultures  Sensi of staph epidermidis from fungal cultures left [facility] 1/29 is pending and called micro to make sure they are working on it  Odette Fraction, MD Infectious Disease Physician [facility] [address] [place], [place] [address] Phone: [phone]  Fax: [phone]

## 2023-05-12 NOTE — Progress Notes (Addendum)
NAME:  [name], MRN:  [id number], DOB:  Jul 05, 1975, LOS: 3 ADMISSION DATE:  05/09/2023, CONSULTATION DATE:  05/09/23 REFERRING MD:  EDP, CHIEF COMPLAINT:  shock   History of Present Illness:  48 yo with complicated pmh presented today with weakness, chills, n/v that started today. Pt states that she has been in her normal state of health with exception of a uri over past few days. She states that despite this she has been eating and drinking as normal as well as tolerating her tpn. She denies fevers, states that today she had sudden onset of chills and vomited x1 but no change in stool output from her baseline. She states that if she did not feel so weak she would not have come to ed.   Upon presentation she was noted to be hypotensive, unresponsive to ivf as well as albumin infusion. She was cultured and started on iv abx after multiple conversations encouraging coverage for presumed sepsis in light of her inflammed small bowel noted on imaging as well as known tpn use, concern for fungal infection. She was also tachycardic to 160's but states that she has this regularly. After fluids her hr returned to 100-110 but BP ultimately req vasopressor initiation.   She is pending rvp. For now wil cont with abx and antifungal until cx return.   Ccm was asked to admit 2/2 need for low dose pressors.   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  To coccus my talus Blood culture left antecubital also blood culture Staph epidermidis positive and Streptococcus mitis/oralis Blood culture Port-A-Cath  positive for Staphylococcus epidermidis Influenza A 11/09/2007 positive Stool PCR negative 05/10/23 -infectious diseases consultation with diagnosis of influenza.  Diagnosis of strep species in 1 out of 2 bottles and MRSA AP and the other 1 out of 2 bottles likely contaminant.  She has a history of Crohn's complicated by intra-abdominal abscess and short gut syndrome on TPN.  She has large amount of weight loss in September and low blood pressure since then.  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 Plan to continue Tamiflu, continue daptomycin to cover strep species and epi, subject to HIV and vitals hepatitis screening.  And follow-up 20 panel RVP. Positive for H1 influenza 2009 05/11/2023: On Levophed 2 mcg.  On room air.  Afebrile with normal white count now.  Random cortisol is 7.6.  Discussed hydrocortisone but patient is reluctant because of chronic steroids side effect.  Nursing inquiring about midodrine to replace blood pressure.  She is not on oxygen.  She is able to eat overall.  She says she sat up.  Denies any major complaints. TON still on hold since ad  Interim History / Subjective:   05/12/2023: Came off vasopressors but blood pressure got soft again but responded to hydrocortisone x 1 and admitted on midodrine.  Open [random cortisol was low].  Currently feeling well and sitting.  Chronic TPN 16 hours a day is on hold as of today because of concern of possible Port-A-Cath infection.  Afebrile.   Objective   Blood pressure 90/64, pulse 81, temperature 98.8 F (37.1 C), temperature source Oral,  resp. rate 20, height 5\' 4"  (1.626 m), weight 47.5 kg, last menstrual period 04/24/2021, SpO2 97%.        Intake/Output Summary (Last 24 hours) at 05/12/2023 1623 Last data filed at 05/12/2023 1026 Gross per 24 hour  Intake 1541.84 ml  Output --  Net 1541.84 ml   Filed Weights   05/09/23 1550 05/10/23 0105  Weight: 49 kg 47.5 kg     General Appearance: Looks better.  Sitting  on the chair.  She has eyeglasses on.  She is on the phone.  She says she feels better. Head:  Normocephalic, without obvious abnormality, atraumatic Eyes:  PERRL - yes, conjunctiva/corneas - muddy     Ears:  Normal external ear canals, both ears Nose:  G tube - no Throat:  ETT TUBE - no , OG tube - no Neck:  Supple,  No enlargement/tenderness/nodules Lungs: Clear to auscultation bilaterally,  Chest has right-sided Port-A-Cath and other scars. Heart:  S1 and S2 normal, no murmur, CVP - no.  Pressors - no Abdomen:  Soft, no masses, no organomegaly Genitalia / Rectal:  Not done Extremities:  Extremities-intact Skin:  ntact in exposed areas . Sacral area -not examined but per nursing no sacral decub Neurologic:  Sedation - none -> RASS - +1 . Moves all 4s - yes. CAM-ICU - neg . Orientation - x3+     Resolved Hospital Problem list       Assessment & Plan:    PULMONARY  A:  History of pulmonary embolism lower lobe subsegmental December 19, 2022 Influenza A this admission but not in respiratory distress or requiring oxygen  05/12/2023-> not on oxygen at least for 48 hours  P:   Monitor clinically Continue Eliquis for history of PE   NEUROLOGIC A:   Baseline Xanax as needed  Baseline oxycodone as needed  - 05/11/2023 and 05/12/2023 intact mental status  P:   Monitor without baseline Xanax and oxycodone    VASCULAR A:   Baseline metoprolol and Status post right upper extremity PICC placement by IR 01/09/2023 Circulatory shock this admission in the setting of progressive weight loss and slow decline in blood pressure and current influenza A/bacteremia  - 05/11/2023: Random cortisol at 7.6.  Came off Levophed after midodrine.  Initially refused hydrocortisone but later agreed to have it -05/12/2023 blood pressure tending to drop but responded to hydrocortisone x 1  P:  Discontinue Levophed of the MAR MAP goal greater than 65 cotnie midodrine Continue hydrocortisone 100 mg  once daily since 05/12/2023  CARDIAC STRUCTURAL A: Normal echo May 11, 2023    P: Monitor  CARDIAC ELECTRICAL A: Sinus tachycardia -improved but is in the 90s   P: Telemetry  INFECTIOUS A:   #Baseline Intra-abdominal abscesses since September 2024 complicated course involving infectious disease consultation multiple antibiotics and antifungal -   -Fall 2024 intolerance to Cipro and Flagyl  -November 2024 added Augmentin  - CT abdomen this admission January 2025 fairly benign with diffuse small bowel thickening but no obstructive pathology and presence of left-sided ileostomy and colectomy with small amount of free fluid in the right pelvis.  But no abscess  #Current admission  -Influenza A at admission  -Bacteremia suspected Port-A-Cath infection with Streptococcus mentalis and Staph epidermidis  05/11/2023 -being followed by infectious disease consult.  Afebrile  P:   Daptomycin per infectious diseases Tamiflu per infectious diseases Await infectious disease recommendation of discontinuing Port-A-Cath TPN on hold  RENAL A:  Acute kidney injury at admission  creatinine 1.4 mg percent [baseline 0.7 mg percent]  05/12/2023: Creatinine further improved to 0.8 mg percent.  P:  Hydrate and monitor  METABOLIC A: At risk for bicarbonate loss and metabolic acidosis given short gut syndrome  05/11/2023 -bicarb 28  P Monitor clinically*   ELECTROLYTES A:  Hypophosphatemia mild  P: Replete both   GASTROINTESTINAL A:   Crohn's disease status post protracted hospitalization September 2024 for small bowel obstruction requiring bowel obstruction due to ischemia status post exploratory laparotomy with ileostomy abdominal wound closure -   Subsequent short gut syndrome -since October 2024 - on TPN 16h  per day 5pm start "spl formula" + oral   Chronic right lower quadrant cutaneous drain  -status post exchange 03/07/2023 Status post ostomy bag    P:    Monitor  HEMATOLOGIC   - HEME A:  Chronic anemia iron 8 g% baseline -1.  Iron deficiency  -No active bleeding P:  - PRBC for hgb </= 6.9gm%    - exceptions are   -  if ACS susepcted/confirmed then transfuse for hgb </= 8.0gm%,  or    -  active bleeding with hemodynamic instability, then transfuse regardless of hemoglobin value   At at all times try to transfuse 1 unit prbc as possible with exception of active hemorrhage   HEMATOLOGIC - Platelets A Normal platelets at baseline  05/11/2023 normal platelets   P Monitor DVT prophylaxis  ENDOCRINE A:   Concern for relative adrenal insufficiency-serum cortisol 7.6 on 05/10/2023  05/11/2023 reluctant to take stress dose steroids  P:   Monitor  MSK/DERM History of metastatic breast cancer Severe protein calorie malnutrition following September 2024 admission for small bowel obstruction with strangulation   Plan - Nutritional support     Best Practice (right click and "Reselect all SmartList Selections" daily)   Diet/type: Regular consistency (see orders) and TPN - restart TPN 05/11/23 if ok with ID d/with Melissa RPh -> stop TPN 05/13/2023 because of concern of line infection.  Will do D5 at 40 cc/h DVT prophylaxis DOAC Pressure ulcer(s): N/A GI prophylaxis: N/A Lines: cvc in place for chronic tpn Foley:  N/A Code Status:  full code Last date of multidisciplinary goals of care discussion [full code per discussion with pt]  05/11/2023: Patient at bedside updated. 05/12/2023: Patient updated patient at the bedside  Disposition -Moved to telemetry -> hospitalist to assume primary from 05/13/2023 and CCM off [discussed with Dr. Mahala Menghini     ATTESTATION & SIGNATURE      Dr. Kalman Shan, M.D., [facility].C.P Pulmonary and Critical Care Medicine Staff Physician, [facility] Pager: [phone], If no answer or between  15:00h - 7:00h: call [phone]  05/12/2023 4:23  PM     LABS    PULMONARY No results for input(s): "PHART", "PCO2ART", "PO2ART", "HCO3", "TCO2", "O2SAT" in the last 168 hours.  Invalid input(s): "PCO2", "PO2"  CBC Recent Labs  Lab 05/09/23 1625 05/09/23 2245 05/11/23 0645  HGB 10.8* 9.3* 8.3*  HCT 33.8* 29.1* 26.6*  WBC 19.4* 24.0* 9.5  PLT 255 258 221    COAGULATION Recent Labs  Lab 05/09/23 1625  INR 1.2    CARDIAC  No results for input(s): "TROPONINI" in the last 168 hours. No results for input(s): "PROBNP" in the last 168 hours.   CHEMISTRY Recent Labs  Lab 05/09/23 1625 05/09/23 2245 05/11/23 0645 05/12/23 1156  NA 131*  --  134* 138  K 3.6  --  3.1* 4.0  CL 96*  --  99 100  CO2 20*  --  28 27  GLUCOSE 97  --  124* 130*  BUN 34*  --  10 22*  CREATININE 1.40* 1.22* 0.91 0.80  CALCIUM 9.1  --  8.6* 9.4  MG  --   --  1.7 2.2  PHOS  --   --  2.9 2.4*   Estimated Creatinine Clearance: 66.6 mL/min (by C-G formula based on SCr of 0.8 mg/dL).   LIVER Recent Labs  Lab 05/09/23 1625 05/12/23 1156  AST 88* 28  ALT 111* 54*  ALKPHOS 294* 164*  BILITOT 1.4* 0.8  PROT 8.5* 7.3  ALBUMIN 3.5 3.1*  INR 1.2  --      INFECTIOUS Recent Labs  Lab 05/09/23 1635 05/12/23 1156  LATICACIDVEN 1.0  --   PROCALCITON  --  53.99     ENDOCRINE CBG (last 3)  Recent Labs    05/11/23 2156 05/12/23 0608 05/12/23 1145  GLUCAP 111* 131* 111*         IMAGING x48h  - image(s) personally visualized  -   highlighted in bold

## 2023-05-12 NOTE — Progress Notes (Addendum)
This nurse spoke with on call ID physician in reference to positive blood culture/whether we will continue TPN this evening. Per ID provider, hold TPN today until they decide whether the PICC will be removed or not. Informed Melissa with pharmacy.

## 2023-05-12 NOTE — Progress Notes (Signed)
PHARMACY - TOTAL PARENTERAL NUTRITION CONSULT NOTE   Indication: Short bowel syndrome  Patient Measurements: Height: 5\' 4"  (162.6 cm) Weight: 47.5 kg (104 lb 11.5 oz) IBW/kg (Calculated) : 54.7 TPN AdjBW (KG): 47.5 Body mass index is 17.97 kg/m. 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 Last administered: 1/27-1/28 1/31 TPN formula from [employer] faxed to [facility]  2/1 per ID notes, unclear if blood culture obtained on 1/29 at 1625 from port showing MRSE is a colonizer of the port or a true pathogen. Patient currently on daptomycin.  Glucose / Insulin: No history of DM. FBS elevated at 131. Electrolytes:  Labs need to be collected today Renal: SCr and BUN WNL as of yesterday Hepatic: labs ordered Intake / Output; MIVF:  Output not charted yesterday. No MIVF GI Imaging: 1/29 No obstruction. Left-sided ileostomy post colectomy. GI Surgeries / Procedures: none this admission  Central access: prior to admission CVC Double Lumen in place TPN start date: 1/31  Nutritional Goals: From [employer], MWF with lipids cyclic TPN formula provides 80 g of protein and 1214 kcals per day 1/31 Inpt TPN with lipids cyclic TPN formula provides 80 g of protein and 1230 kcal per day  RD Assessment: Pending Estimated Needs Total Energy Estimated Needs: 1650-1800 kcals Total Protein Estimated Needs: 80-95 grams Total Fluid Estimated Needs: >/= 1.7L  Current Nutrition:  Regular diet and TPN  Plan:  Per ID, hold TPN for now   Thank you for allowing pharmacy to be a part of this patient's care.  Selinda Eon, PharmD, BCPS Clinical Pharmacist [place] 05/12/2023 11:16 AM

## 2023-05-13 DIAGNOSIS — A419 Sepsis, unspecified organism: Secondary | ICD-10-CM | POA: Diagnosis not present

## 2023-05-13 DIAGNOSIS — R6521 Severe sepsis with septic shock: Secondary | ICD-10-CM | POA: Diagnosis not present

## 2023-05-13 LAB — CULTURE, BLOOD (ROUTINE X 2)

## 2023-05-13 LAB — GLUCOSE, CAPILLARY
Glucose-Capillary: 79 mg/dL (ref 70–99)
Glucose-Capillary: 132 mg/dL — ABNORMAL HIGH (ref 70–99)
Glucose-Capillary: 116 mg/dL — ABNORMAL HIGH (ref 70–99)

## 2023-05-13 LAB — PHOSPHORUS: Phosphorus: 3.6 mg/dL (ref 2.5–4.6)

## 2023-05-13 LAB — CBC
HCT: 28 % — ABNORMAL LOW (ref 36.0–46.0)
Hemoglobin: 8.5 g/dL — ABNORMAL LOW (ref 12.0–15.0)
MCH: 26.6 pg (ref 26.0–34.0)
MCHC: 30.4 g/dL (ref 30.0–36.0)
MCV: 87.5 fL (ref 80.0–100.0)
Platelets: 270 10*3/uL (ref 150–400)
RBC: 3.2 MIL/uL — ABNORMAL LOW (ref 3.87–5.11)
RDW: 13.8 % (ref 11.5–15.5)
WBC: 7.9 10*3/uL (ref 4.0–10.5)
nRBC: 0 % (ref 0.0–0.2)

## 2023-05-13 LAB — BASIC METABOLIC PANEL
Anion gap: 10 (ref 5–15)
BUN: 23 mg/dL — ABNORMAL HIGH (ref 6–20)
CO2: 26 mmol/L (ref 22–32)
Calcium: 9 mg/dL (ref 8.9–10.3)
Chloride: 100 mmol/L (ref 98–111)
Creatinine, Ser: 0.94 mg/dL (ref 0.44–1.00)
GFR, Estimated: >60 mL/min (ref >60)
Glucose, Bld: 236 mg/dL — ABNORMAL HIGH (ref 70–99)
Potassium: 3.4 mmol/L — ABNORMAL LOW (ref 3.5–5.1)
Sodium: 136 mmol/L (ref 135–145)

## 2023-05-13 LAB — MAGNESIUM: Magnesium: 2 mg/dL (ref 1.7–2.4)

## 2023-05-13 MED ORDER — POTASSIUM CHLORIDE 10 MEQ/50ML IV SOLN
10.0000 meq | INTRAVENOUS | Status: AC
  Administered 2023-05-13 (×4): 10 meq via INTRAVENOUS
  Filled 2023-05-13 (×4): qty 50

## 2023-05-13 MED ORDER — LACTATED RINGERS IV BOLUS
500.0000 mL | Freq: Once | INTRAVENOUS | Status: AC
  Administered 2023-05-13: 500 mL via INTRAVENOUS

## 2023-05-13 NOTE — Progress Notes (Signed)
Patient taken via wheelchair to floor by CN.  Alert and oriented to person, place, time and situation.  Let room with NT in room.

## 2023-05-13 NOTE — TOC Initial Note (Addendum)
Transition of Care [facility]) - Initial/Assessment Note    Patient Details  Name: [name] MRN: [id number] Date of Birth: [birth year]  [facility]) CM/SW Contact:    Adrian Prows, RN Phone Number: 05/13/2023, 12:52 PM  Clinical Narrative:                 TOC for d/c planning; spoke w/ pt in room; pt says she lives at home; she plans to return at d/c; she identified POC dtr [employer] [phone]); she says her dtr will provide transportation; pt verified PCP/insurance; she denied SDPH risks; she has a shower chair; pt says she has home TPN w/ [employer], and HHRN for dressing changes on her port; she is not sure what agency the [facility] is from; Jeri Modena at [place] given notification of hospital admit; TOC is following.  Expected Discharge Plan: Home w Home Health Services Barriers to Discharge: Continued Medical Work up   Patient Goals and CMS Choice Patient states their goals for this hospitalization and ongoing recovery are:: home          Expected Discharge Plan and Services   Discharge Planning Services: CM Consult Post Acute Care Choice: Resumption of Svcs/PTA Provider (home TPN w/ [employer]; HHRN) Living arrangements for the past 2 months: Single Family Home                                      Prior Living Arrangements/Services Living arrangements for the past 2 months: Single Family Home Lives with:: Self Patient language and need for interpreter reviewed:: Yes Do you feel safe going back to the place where you live?: Yes      Need for Family Participation in Patient Care: Yes (Comment) Care giver support system in place?: Yes (comment) Current home services: Home RN, Other (comment) (home TPN from [facility]; [facility]) Criminal Activity/Legal Involvement Pertinent to Current Situation/Hospitalization: No - Comment as needed  Activities of Daily Living   ADL Screening (condition at time of admission) Independently performs ADLs?:  Yes (appropriate for developmental age) Is the patient deaf or have difficulty hearing?: No Does the patient have difficulty seeing, even when wearing glasses/contacts?: No Does the patient have difficulty concentrating, remembering, or making decisions?: No  Permission Sought/Granted Permission sought to share information with : Case Manager Permission granted to share information with : Yes, Verbal Permission Granted  Share Information with NAME: Case Manager     Permission granted to share info w Relationship: Bunnie Philips (dtr) [phone]     Emotional Assessment Appearance:: Appears stated age Attitude/Demeanor/Rapport: Gracious Affect (typically observed): Accepting Orientation: : Oriented to Self, Oriented to Place, Oriented to  Time, Oriented to Situation Alcohol / Substance Use: Not Applicable Psych Involvement: No (comment)  Admission diagnosis:  Septic shock (HCC) [A41.9, R65.21] Hypotension, unspecified hypotension type [I95.9] Leukocytosis, unspecified type [D72.829] Patient Active Problem List   Diagnosis Date Noted   Hypotension 05/10/2023   On total parenteral nutrition (TPN) 05/10/2023   Influenza A 05/10/2023   Positive blood culture 05/10/2023   Crohn's disease (HCC) 05/10/2023   H/O abdominal abscess 05/10/2023   Weight loss 05/10/2023   Irritant contact dermatitis associated with fecal stoma 02/20/2023   Ileostomy care (HCC) 02/20/2023   Acute pulmonary embolism without acute cor pulmonale (HCC) 12/29/2022   Fever, unspecified 12/28/2022   Palliative care by specialist 12/27/2022   Abdominal pain 12/27/2022   Malnutrition of  moderate degree 12/24/2022   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 12/20/2022   SBO with strangulation s/p resection 12/14/2022 12/20/2022   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   Septic shock (HCC) 12/14/2022   Cardiac arrest (HCC) 12/13/2022   Chronic disease anemia 11/19/2022   Protein-calorie malnutrition, severe (HCC)  11/19/2022   Failure to thrive in adult 11/19/2022   Anorexia 11/19/2022   Nausea 11/19/2022   Leukocytosis 05/27/2021   Crohn's disease of both small and large intestine with intestinal obstruction (HCC)    Breast cancer (HCC) 11/09/2020   Port-A-Cath in place 07/15/2020   Hypokalemia 06/30/2020   Genetic testing 06/25/2020   Family history of breast cancer 06/11/2020   Malignant neoplasm of upper-outer quadrant of right breast in female, estrogen receptor positive (HCC) 06/04/2020   Abnormal finding present on diagnostic imaging of uterus 05/07/2019   Generalized abdominal pain    Vitamin D deficiency 09/24/2013   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 10/17/2010   PCP:  Ollen Bowl, MD Pharmacy:   [facility] [phone] - [place] Otto, [place] - [address] AT [facility] [address] [place] [address] Phone: [phone] Fax: [phone]     Social Drivers of Health (SDOH) Social History: SDOH Screenings   Food Insecurity: No Food Insecurity (05/13/2023)  Housing: Low Risk  (05/13/2023)  Transportation Needs: No Transportation Needs (05/13/2023)  Utilities: Not At Risk (05/13/2023)  Tobacco Use: Medium Risk (05/09/2023)   SDOH Interventions: Food Insecurity Interventions: Intervention Not Indicated, Inpatient TOC Housing Interventions: Intervention Not Indicated, Inpatient TOC Transportation Interventions: Intervention Not Indicated, Inpatient TOC Utilities Interventions: Intervention Not Indicated, Inpatient TOC   Readmission Risk Interventions    05/13/2023   12:48 PM 12/20/2022    3:50 PM 12/15/2022    2:43 PM  Readmission Risk Prevention Plan  Transportation Screening Complete Complete Complete  PCP or Specialist Appt within 3-5 Days   Complete  HRI or Home Care Consult   Complete  Social Work Consult for Recovery Care Planning/Counseling   Complete  Palliative Care Screening   Complete  Medication  Review Oceanographer) Complete Complete Complete  PCP or Specialist appointment within 3-5 days of discharge  Complete   HRI or Home Care Consult Complete Complete   SW Recovery Care/Counseling Consult Complete Complete   Palliative Care Screening Not Applicable Complete   Skilled Nursing Facility Not Applicable Not Applicable

## 2023-05-13 NOTE — Progress Notes (Signed)
ID brief note  Afebrile  Results for orders placed or performed during the hospital encounter of 05/09/23  Blood Culture (routine x 2)     Status: Abnormal   Collection Time: 05/09/23  4:25 PM   Specimen: BLOOD  Result Value Ref Range Status   Specimen Description   Final    BLOOD PORTA CATH Performed at [facility], [address] [place], [place] [address]    Special Requests   Final    BOTTLES DRAWN AEROBIC AND ANAEROBIC Blood Culture results may not be optimal due to an inadequate volume of blood received in culture bottles Performed at [facility], [address], [place], [place] [address]    Culture  Setup Time   Final    GRAM POSITIVE COCCI IN BOTH AEROBIC AND ANAEROBIC BOTTLES CRITICAL RESULT CALLED TO, READ BACK BY AND VERIFIED WITH: C SHADE,PHARMD@[id number]  05/10/23 MK Performed at [facility], [address], [place], [place] [address]    Culture STAPHYLOCOCCUS EPIDERMIDIS (A)  Final   Report Status 05/12/2023 FINAL  Final   Organism ID, Bacteria STAPHYLOCOCCUS EPIDERMIDIS  Final      Susceptibility   Staphylococcus epidermidis - MIC*    CIPROFLOXACIN 4 RESISTANT Resistant     ERYTHROMYCIN >=8 RESISTANT Resistant     GENTAMICIN >=16 RESISTANT Resistant     OXACILLIN >=4 RESISTANT Resistant     TETRACYCLINE 2 SENSITIVE Sensitive     VANCOMYCIN 1 SENSITIVE Sensitive     TRIMETH/SULFA 40 SENSITIVE Sensitive     CLINDAMYCIN <=0.25 SENSITIVE Sensitive     RIFAMPIN <=0.5 SENSITIVE Sensitive     Inducible Clindamycin NEGATIVE Sensitive     * STAPHYLOCOCCUS EPIDERMIDIS  Blood Culture ID Panel (Reflexed)     Status: Abnormal   Collection Time: 05/09/23  4:25 PM  Result Value Ref Range Status   Enterococcus faecalis NOT DETECTED NOT DETECTED Final   Enterococcus Faecium NOT DETECTED NOT DETECTED Final   Listeria monocytogenes NOT DETECTED NOT DETECTED Final   Staphylococcus species DETECTED (A) NOT DETECTED Final    Comment:  CRITICAL RESULT CALLED TO, READ BACK BY AND VERIFIED WITH: C SHADE,PHARMD@0718  05/10/23 MK    Staphylococcus aureus (BCID) NOT DETECTED NOT DETECTED Final   Staphylococcus epidermidis DETECTED (A) NOT DETECTED Final    Comment: Methicillin (oxacillin) resistant coagulase negative staphylococcus. Possible blood culture contaminant (unless isolated from more than one blood culture draw or clinical case suggests pathogenicity). No antibiotic treatment is indicated for blood  culture contaminants. CRITICAL RESULT CALLED TO, READ BACK BY AND VERIFIED WITH: C SHADE,PHARMD@0718  05/10/23 MK    Staphylococcus lugdunensis NOT DETECTED NOT DETECTED Final   Streptococcus species NOT DETECTED NOT DETECTED Final   Streptococcus agalactiae NOT DETECTED NOT DETECTED Final   Streptococcus pneumoniae NOT DETECTED NOT DETECTED Final   Streptococcus pyogenes NOT DETECTED NOT DETECTED Final   A.calcoaceticus-baumannii NOT DETECTED NOT DETECTED Final   Bacteroides fragilis NOT DETECTED NOT DETECTED Final   Enterobacterales NOT DETECTED NOT DETECTED Final   Enterobacter cloacae complex NOT DETECTED NOT DETECTED Final   Escherichia coli NOT DETECTED NOT DETECTED Final   Klebsiella aerogenes NOT DETECTED NOT DETECTED Final   Klebsiella oxytoca NOT DETECTED NOT DETECTED Final   Klebsiella pneumoniae NOT DETECTED NOT DETECTED Final   Proteus species NOT DETECTED NOT DETECTED Final   Salmonella species NOT DETECTED NOT DETECTED Final   Serratia marcescens NOT DETECTED NOT DETECTED Final   Haemophilus influenzae NOT DETECTED NOT DETECTED Final  Neisseria meningitidis NOT DETECTED NOT DETECTED Final   Pseudomonas aeruginosa NOT DETECTED NOT DETECTED Final   Stenotrophomonas maltophilia NOT DETECTED NOT DETECTED Final   Candida albicans NOT DETECTED NOT DETECTED Final   Candida auris NOT DETECTED NOT DETECTED Final   Candida glabrata NOT DETECTED NOT DETECTED Final   Candida krusei NOT DETECTED NOT DETECTED Final    Candida parapsilosis NOT DETECTED NOT DETECTED Final   Candida tropicalis NOT DETECTED NOT DETECTED Final   Cryptococcus neoformans/gattii NOT DETECTED NOT DETECTED Final   Methicillin resistance mecA/C DETECTED (A) NOT DETECTED Final    Comment: CRITICAL RESULT CALLED TO, READ BACK BY AND VERIFIED WITH: C SHADE,PHARMD@0718  05/10/23 MK Performed at [facility], [address] [place], [place] [address]   Blood Culture (routine x 2)     Status: Abnormal (Preliminary result)   Collection Time: 05/09/23  4:40 PM   Specimen: BLOOD  Result Value Ref Range Status   Specimen Description   Final    BLOOD LEFT ANTECUBITAL Performed at [facility], [address], [place], [place] [address]    Special Requests   Final    BOTTLES DRAWN AEROBIC AND ANAEROBIC Blood Culture results may not be optimal due to an inadequate volume of blood received in culture bottles Performed at [facility], [address] [place], [place] [address]    Culture  Setup Time   Final    GRAM POSITIVE COCCI IN CHAINS ANAEROBIC BOTTLE ONLY CRITICAL RESULT CALLED TO, READ BACK BY AND VERIFIED WITH: PHARMD CHRISTINE SHADE ON 05/10/23 @ 1239 BY DRT    Culture (A)  Final    STREPTOCOCCUS MITIS/ORALIS SUSCEPTIBILITIES TO FOLLOW Performed at [facility], [address], [place], [place] [address]    Report Status PENDING  Incomplete  Fungus culture, blood     Status: Abnormal (Preliminary result)   Collection Time: 05/09/23  4:40 PM   Specimen: BLOOD  Result Value Ref Range Status   Specimen Description   Final    BLOOD LEFT ANTECUBITAL Performed at [facility], [address] [place], [place] [address]    Special Requests   Final    NONE Performed at [facility], [address], [place], [place] [address]    Fungal Smear   Final    GRAM POSITIVE COCCI IN CLUSTERS AEROBIC BOTTLE ONLY GRAM POSITIVE COCCI IN CHAINS ANAEROBIC BOTTLE  ONLY CRITICAL VALUE NOTED.  VALUE IS CONSISTENT WITH PREVIOUSLY REPORTED AND CALLED VALUE.    Culture (A)  Final    STREPTOCOCCUS MITIS/ORALIS STAPHYLOCOCCUS EPIDERMIDIS CULTURE REINCUBATED FOR BETTER GROWTH Performed at [facility], [address] [place], [place] [address]    Report Status PENDING  Incomplete   Organism ID, Bacteria STREPTOCOCCUS MITIS/ORALIS  Final      Susceptibility   Streptococcus mitis/oralis - MIC*    PENICILLIN 0.12 SENSITIVE Sensitive     CEFTRIAXONE <=0.12 SENSITIVE Sensitive     LEVOFLOXACIN 1 SENSITIVE Sensitive     VANCOMYCIN 0.5 SENSITIVE Sensitive     * STREPTOCOCCUS MITIS/ORALIS  Blood Culture ID Panel (Reflexed)     Status: Abnormal   Collection Time: 05/09/23  4:40 PM  Result Value Ref Range Status   Enterococcus faecalis NOT DETECTED NOT DETECTED Final   Enterococcus Faecium NOT DETECTED NOT DETECTED Final   Listeria monocytogenes NOT DETECTED NOT DETECTED Final   Staphylococcus species NOT DETECTED NOT DETECTED Final   Staphylococcus aureus (BCID)  NOT DETECTED NOT DETECTED Final   Staphylococcus epidermidis NOT DETECTED NOT DETECTED Final   Staphylococcus lugdunensis NOT DETECTED NOT DETECTED Final   Streptococcus species DETECTED (A) NOT DETECTED Final    Comment: Not Enterococcus species, Streptococcus agalactiae, Streptococcus pyogenes, or Streptococcus pneumoniae. CRITICAL RESULT CALLED TO, READ BACK BY AND VERIFIED WITH: PHARMD CHRISTINE SHADE ON 05/10/23 @ 1239 BY DRT    Streptococcus agalactiae NOT DETECTED NOT DETECTED Final   Streptococcus pneumoniae NOT DETECTED NOT DETECTED Final   Streptococcus pyogenes NOT DETECTED NOT DETECTED Final   A.calcoaceticus-baumannii NOT DETECTED NOT DETECTED Final   Bacteroides fragilis NOT DETECTED NOT DETECTED Final   Enterobacterales NOT DETECTED NOT DETECTED Final   Enterobacter cloacae complex NOT DETECTED NOT DETECTED Final   Escherichia coli NOT DETECTED NOT DETECTED Final    Klebsiella aerogenes NOT DETECTED NOT DETECTED Final   Klebsiella oxytoca NOT DETECTED NOT DETECTED Final   Klebsiella pneumoniae NOT DETECTED NOT DETECTED Final   Proteus species NOT DETECTED NOT DETECTED Final   Salmonella species NOT DETECTED NOT DETECTED Final   Serratia marcescens NOT DETECTED NOT DETECTED Final   Haemophilus influenzae NOT DETECTED NOT DETECTED Final   Neisseria meningitidis NOT DETECTED NOT DETECTED Final   Pseudomonas aeruginosa NOT DETECTED NOT DETECTED Final   Stenotrophomonas maltophilia NOT DETECTED NOT DETECTED Final   Candida albicans NOT DETECTED NOT DETECTED Final   Candida auris NOT DETECTED NOT DETECTED Final   Candida glabrata NOT DETECTED NOT DETECTED Final   Candida krusei NOT DETECTED NOT DETECTED Final   Candida parapsilosis NOT DETECTED NOT DETECTED Final   Candida tropicalis NOT DETECTED NOT DETECTED Final   Cryptococcus neoformans/gattii NOT DETECTED NOT DETECTED Final    Comment: Performed at [facility], [address], [place], [place] [address]  Resp panel by RT-PCR (RSV, Flu A&B, Covid) Anterior Nasal Swab     Status: Abnormal   Collection Time: 05/09/23 10:45 PM   Specimen: Anterior Nasal Swab  Result Value Ref Range Status   SARS Coronavirus 2 by RT PCR NEGATIVE NEGATIVE Final    Comment: (NOTE) SARS-CoV-2 target nucleic acids are NOT DETECTED.  The SARS-CoV-2 RNA is generally detectable in upper respiratory specimens during the acute phase of infection. The lowest concentration of SARS-CoV-2 viral copies this assay can detect is 138 copies/mL. A negative result does not preclude SARS-Cov-2 infection and should not be used as the sole basis for treatment or other patient management decisions. A negative result may occur with  improper specimen collection/handling, submission of specimen other than nasopharyngeal swab, presence of viral mutation(s) within the areas targeted by this assay, and inadequate number of  viral copies(<138 copies/mL). A negative result must be combined with clinical observations, patient history, and epidemiological information. The expected result is Negative.  Fact Sheet for Patients:  BloggerCourse.com  Fact Sheet for Healthcare Providers:  SeriousBroker.it  This test is no t yet approved or cleared by the [place] FDA and  has been authorized for detection and/or diagnosis of SARS-CoV-2 by FDA under an Emergency Use Authorization (EUA). This EUA will remain  in effect (meaning this test can be used) for the duration of the COVID-19 declaration under Section 564(b)(1) of the Act, 21 U.S.C.section 360bbb-3(b)(1), unless the authorization is terminated  or revoked sooner.       Influenza A by PCR POSITIVE (A) NEGATIVE Final   Influenza B by PCR NEGATIVE NEGATIVE Final    Comment: (NOTE) The Xpert Xpress SARS-CoV-2/FLU/RSV plus assay is  intended as an aid in the diagnosis of influenza from Nasopharyngeal swab specimens and should not be used as a sole basis for treatment. Nasal washings and aspirates are unacceptable for Xpert Xpress SARS-CoV-2/FLU/RSV testing.  Fact Sheet for Patients: BloggerCourse.com  Fact Sheet for Healthcare Providers: SeriousBroker.it  This test is not yet approved or cleared by the [place] FDA and has been authorized for detection and/or diagnosis of SARS-CoV-2 by FDA under an Emergency Use Authorization (EUA). This EUA will remain in effect (meaning this test can be used) for the duration of the COVID-19 declaration under Section 564(b)(1) of the Act, 21 U.S.C. section 360bbb-3(b)(1), unless the authorization is terminated or revoked.     Resp Syncytial Virus by PCR NEGATIVE NEGATIVE Final    Comment: (NOTE) Fact Sheet for Patients: BloggerCourse.com  Fact Sheet for Healthcare  Providers: SeriousBroker.it  This test is not yet approved or cleared by the [place] FDA and has been authorized for detection and/or diagnosis of SARS-CoV-2 by FDA under an Emergency Use Authorization (EUA). This EUA will remain in effect (meaning this test can be used) for the duration of the COVID-19 declaration under Section 564(b)(1) of the Act, 21 U.S.C. section 360bbb-3(b)(1), unless the authorization is terminated or revoked.  Performed at [facility], [address] [place], [place] [address]   Gastrointestinal Panel by PCR , Stool     Status: None   Collection Time: 05/09/23 10:45 PM   Specimen: Anterior Nasal Swab; Stool  Result Value Ref Range Status   Campylobacter species NOT DETECTED NOT DETECTED Final   Plesimonas shigelloides NOT DETECTED NOT DETECTED Final   Salmonella species NOT DETECTED NOT DETECTED Final   Yersinia enterocolitica NOT DETECTED NOT DETECTED Final   Vibrio species NOT DETECTED NOT DETECTED Final   Vibrio cholerae NOT DETECTED NOT DETECTED Final   Enteroaggregative E coli (EAEC) NOT DETECTED NOT DETECTED Final   Enteropathogenic E coli (EPEC) NOT DETECTED NOT DETECTED Final   Enterotoxigenic E coli (ETEC) NOT DETECTED NOT DETECTED Final   Shiga like toxin producing E coli (STEC) NOT DETECTED NOT DETECTED Final   Shigella/Enteroinvasive E coli (EIEC) NOT DETECTED NOT DETECTED Final   Cryptosporidium NOT DETECTED NOT DETECTED Final   Cyclospora cayetanensis NOT DETECTED NOT DETECTED Final   Entamoeba histolytica NOT DETECTED NOT DETECTED Final   Giardia lamblia NOT DETECTED NOT DETECTED Final   Adenovirus F40/41 NOT DETECTED NOT DETECTED Final   Astrovirus NOT DETECTED NOT DETECTED Final   Norovirus GI/GII NOT DETECTED NOT DETECTED Final   Rotavirus A NOT DETECTED NOT DETECTED Final   Sapovirus (I, II, IV, and V) NOT DETECTED NOT DETECTED Final    Comment: Performed at [facility],  [address] [place], [place] [address]  MRSA Next Gen by PCR, Nasal     Status: Abnormal   Collection Time: 05/10/23  1:09 AM   Specimen: Nasal Mucosa; Nasal Swab  Result Value Ref Range Status   MRSA by PCR Next Gen DETECTED (A) NOT DETECTED Final    Comment: (NOTE) The GeneXpert MRSA Assay (FDA approved for NASAL specimens only), is one component of a comprehensive MRSA colonization surveillance program. It is not intended to diagnose MRSA infection nor to guide or monitor treatment for MRSA infections. Test performance is not FDA approved in patients less than 14 years old. Performed at [facility], [address] [place], [place] [address]   Respiratory (~20 pathogens) panel by PCR  Status: Abnormal   Collection Time: 05/10/23 12:46 PM   Specimen: Nasopharyngeal Swab; Respiratory  Result Value Ref Range Status   Adenovirus NOT DETECTED NOT DETECTED Final   Coronavirus 229E NOT DETECTED NOT DETECTED Final    Comment: (NOTE) The Coronavirus on the Respiratory Panel, DOES NOT test for the novel  Coronavirus (2019 nCoV)    Coronavirus HKU1 NOT DETECTED NOT DETECTED Final   Coronavirus NL63 NOT DETECTED NOT DETECTED Final   Coronavirus OC43 NOT DETECTED NOT DETECTED Final   Metapneumovirus NOT DETECTED NOT DETECTED Final   Rhinovirus / Enterovirus NOT DETECTED NOT DETECTED Final   Influenza A H1 2009 DETECTED (A) NOT DETECTED Final   Influenza B NOT DETECTED NOT DETECTED Final   Parainfluenza Virus 1 NOT DETECTED NOT DETECTED Final   Parainfluenza Virus 2 NOT DETECTED NOT DETECTED Final   Parainfluenza Virus 3 NOT DETECTED NOT DETECTED Final   Parainfluenza Virus 4 NOT DETECTED NOT DETECTED Final   Respiratory Syncytial Virus NOT DETECTED NOT DETECTED Final   Bordetella pertussis NOT DETECTED NOT DETECTED Final   Bordetella Parapertussis NOT DETECTED NOT DETECTED Final   Chlamydophila pneumoniae NOT DETECTED NOT DETECTED Final   Mycoplasma  pneumoniae NOT DETECTED NOT DETECTED Final    Comment: Performed at [facility], [address] [place], [place] [address]   *Note: Due to a large number of results and/or encounters for the requested time period, some results have not been displayed. A complete set of results can be found in Results Review.   Sensi of staph epidermidis from 1/29 blood cx from [facility] is pending  Odette Fraction, MD Infectious Disease Physician [facility] [address] [place], [place] [address] Phone: [phone]  Fax: [phone]

## 2023-05-13 NOTE — Progress Notes (Signed)
eLink Physician-Brief Progress Note Patient Name: ADRIENA MANFRE DOB: March 02, 1976 MRN: [id number]   Date of Service  05/13/2023  HPI/Events of Note  48 year old female with a history of known intra-abdominal abscess current antibiotics who presented with recent right lower lobe subsegmental PE and new onset influenza A infection complicated by respiratory failure  Mild hypotension this evening.  Borderline maps.  Not currently on any pressors.  Positive fluid balance for her stay  eICU Interventions  Attempt 500 cc LR bolus, if refractory, will initiate vasopressors     Intervention Category Intermediate Interventions: Hypotension - evaluation and management  Zariel Capano 05/13/2023, 5:57 AM

## 2023-05-13 NOTE — Progress Notes (Signed)
Report called bring up in 15-20 minutes.

## 2023-05-13 NOTE — Progress Notes (Signed)
PROGRESS NOTE    [name]  ZOX:[id number] DOB: 07/06/1975 DOA: 05/09/2023 PCP: Ollen Bowl, MD  Brief Narrative: TRH pickup 05/13/2023 patient admitted 05/09/2023 by PCCM. 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  Patient was found to be hypotensive unresponsive to IV fluids and albumin was started on vasopressors now off of it, off since 05/12/2023, tachycardic improved after IV fluids.  She received a dose of hydrocortisone and midodrine after she was taken off the pressors for ongoing soft blood pressure.  She is being treated with Tamiflu and daptomycin.  In consultation by infectious disease. She has a chronic right lower quadrant cutaneous drain which was exchanged on #27 2024 status post ostomy. Diagnosed with influenza A CT abdomen 05/09/2023-PRESSION:Suggestion of mild diffuse small bowel wall thickening which can be seen with enteritis. No obstruction Left-sided ileostomy post colectomy. Small amount of free fluid in the right pelvis is well as adnexa, unchanged from prior exam. Chronic uterine fibroids.  Assessment & Plan:   Principal Problem:   Septic shock (HCC) Active Problems:   Hypotension   On total parenteral nutrition (TPN)   Influenza A   Positive blood culture   Crohn's disease (HCC)   H/O abdominal abscess   Weight loss   #1 influenza A in the setting of PE September 2024-stable not on oxygen  continue Tamiflu Continue Eliquis Encourage I-S  #2 Hypotension -this is thought to be secondary to bacteremia in the setting of influenza A and progressive weight loss did not respond to IV fluids was on pressors briefly  which was stopped now on midodrine and Solu-Cortef. On metoprolol at home which has been on hold. Patient has had intra-abdominal abscesses in 2024 however current CT does not show any evidence of abscesses. Culture from the Port-A-Cath is growing MRSE Culture from the left [facility] is growing strep mitis and oralis/Staph epidermidis Continue vancomycin Infectious disease following appreciate input  #3 tachycardia improved with IV fluids.  #4 bacteremia ?  Port-A-Cath infected with Streptococcus mentalis and epidermidis ID following On daptomycin TPN on hold as she was getting TPN through the Port-A-Cath ?  ID to decide whether Port-A-Cath should be taken out or not  #5 AKI resolved with IV fluids  #6 ?  Relative adrenal insufficiency she is on steroids chronically at home currently on stress dose steroids to improve blood pressure  #7 moderate malnutrition in the setting of chronic illness with short gut syndrome as evidenced by moderate muscle and fat depletion on TPN which she is on hold Vitamin D level pending   Nutrition Problem: Moderate Malnutrition Etiology: chronic illness (Crohn's disease with short gut syndrome)  Signs/Symptoms: moderate fat depletion, moderate muscle depletion  Interventions: Refer to RD note for recommendations, TPN  Estimated body mass index is 17.97 kg/m as calculated from the following:   Height as of this encounter: 5\' 4"  (1.626 m).   Weight as of this encounter: 47.5 kg.  DVT prophylaxis: On Eliquis Code Status: Full code Family Communication: None at bedside  disposition Plan:  Status is: Inpatient Remains inpatient appropriate because: Acute illness   Consultants:  Infectious disease PCCM  Procedures: None Antimicrobials: Dapto mycin Subjective: Resting in bed she feels okay just have not had enough sleep  denies shortness of breath cough nausea vomiting or pain  Objective: Vitals:   05/13/23 0537 05/13/23 0600 05/13/23 0700 05/13/23  0730  BP:  (!) 85/62 (!) 87/61   Pulse: 87 66 72   Resp: 16 20 (!) 30   Temp:    (!) 97.3 F (36.3 C)  TempSrc:    Oral  SpO2: 99% 100% 100%   Weight:      Height:        Intake/Output Summary (Last 24 hours) at 05/13/2023 0859 Last data filed at 05/13/2023 0700 Gross per 24 hour  Intake 873.94 ml  Output --  Net 873.94 ml   Filed Weights   05/09/23 1550 05/10/23 0105  Weight: 49 kg 47.5 kg    Examination: Appears chronically ill and cachectic  General exam: Appears in no acute distress Respiratory system: Clear to auscultation. Respiratory effort normal.  Right upper chest Port-A-Cath in place nontender Cardiovascular system: reg tachycardic  gastrointestinal system: Abdomen is nondistended, soft and nontender. No organomegaly or masses felt. Normal bowel sounds heard.  Left colostomy bag with loose stool and air noted Central nervous system: Alert and oriented. No focal neurological deficits. Extremities: No edema   Data Reviewed: I have personally reviewed following labs and imaging studies  CBC: Recent Labs  Lab 05/09/23 1625 05/09/23 2245 05/11/23 0645 05/13/23 0649  WBC 19.4* 24.0* 9.5 7.9  NEUTROABS 18.3*  --   --   --   HGB 10.8* 9.3* 8.3* 8.5*  HCT 33.8* 29.1* 26.6* 28.0*  MCV 84.9 85.1 87.5 87.5  PLT 255 258 221 270   Basic Metabolic Panel: Recent Labs  Lab 05/09/23 1625 05/09/23 2245 05/11/23 0645 05/12/23 1156 05/13/23 0649  NA 131*  --  134* 138 136  K 3.6  --  3.1* 4.0 3.4*  CL 96*  --  99 100 100  CO2 20*  --  28 27 26   GLUCOSE 97  --  124* 130* 236*  BUN 34*  --  10 22* 23*  CREATININE 1.40* 1.22* 0.91 0.80 0.94  CALCIUM 9.1  --  8.6* 9.4 9.0  MG  --   --  1.7 2.2 2.0  PHOS  --   --  2.9 2.4* 3.6   GFR: Estimated Creatinine Clearance: 56.7 mL/min (by C-G formula based on SCr of 0.94 mg/dL). Liver Function Tests: Recent Labs  Lab 05/09/23 1625 05/12/23 1156  AST 88* 28  ALT 111* 54*  ALKPHOS 294* 164*  BILITOT 1.4* 0.8  PROT  8.5* 7.3  ALBUMIN 3.5 3.1*   No results for input(s): "LIPASE", "AMYLASE" in the last 168 hours. No results for input(s): "AMMONIA" in the last 168 hours. Coagulation Profile: Recent Labs  Lab 05/09/23 1625  INR 1.2   Cardiac Enzymes: Recent Labs  Lab 05/11/23 0645  CKTOTAL <5*   BNP (last 3 results) No results for input(s): "PROBNP" in the last 8760 hours. HbA1C: No results for input(s): "HGBA1C" in the last 72 hours. CBG: Recent Labs  Lab 05/12/23 0608 05/12/23 1145 05/12/23 1843 05/12/23 2330 05/13/23 0540  GLUCAP 131* 111* 137* 102* 79   Lipid Profile: No results for input(s): "CHOL", "HDL", "LDLCALC", "TRIG", "CHOLHDL", "LDLDIRECT" in the last 72 hours. Thyroid Function Tests: No results for input(s): "TSH", "T4TOTAL", "FREET4", "T3FREE", "THYROIDAB" in the last 72 hours. Anemia Panel: No results for input(s): "VITAMINB12", "FOLATE", "FERRITIN", "TIBC", "IRON", "RETICCTPCT" in the last 72 hours. Sepsis Labs: Recent Labs  Lab 05/09/23 1635 05/12/23 1156  PROCALCITON  --  53.99  LATICACIDVEN 1.0  --     Recent Results (from the past 240 hours)  Blood Culture (routine x 2)     Status: Abnormal   Collection Time: 05/09/23  4:25 PM   Specimen: BLOOD  Result Value Ref Range Status   Specimen Description   Final    BLOOD PORTA CATH Performed at [facility], [address], [place], [place] [address]    Special Requests   Final    BOTTLES DRAWN AEROBIC AND ANAEROBIC Blood Culture results may not be optimal due to an inadequate volume of blood received in culture bottles Performed at [facility], [address], [place], [place] [address]    Culture  Setup Time   Final    GRAM POSITIVE COCCI IN BOTH AEROBIC AND ANAEROBIC BOTTLES CRITICAL RESULT CALLED TO, READ BACK BY AND VERIFIED WITH: C SHADE,PHARMD@[id number]  05/10/23 MK Performed at [facility], [address], [place], [place] [address]    Culture  STAPHYLOCOCCUS EPIDERMIDIS (A)  Final   Report Status 05/12/2023 FINAL  Final   Organism ID, Bacteria STAPHYLOCOCCUS EPIDERMIDIS  Final      Susceptibility   Staphylococcus epidermidis - MIC*    CIPROFLOXACIN 4 RESISTANT Resistant     ERYTHROMYCIN >=8 RESISTANT Resistant     GENTAMICIN >=16 RESISTANT Resistant     OXACILLIN >=4 RESISTANT Resistant     TETRACYCLINE 2 SENSITIVE Sensitive     VANCOMYCIN 1 SENSITIVE Sensitive     TRIMETH/SULFA 40 SENSITIVE Sensitive     CLINDAMYCIN <=0.25 SENSITIVE Sensitive     RIFAMPIN <=0.5 SENSITIVE Sensitive     Inducible Clindamycin NEGATIVE Sensitive     * STAPHYLOCOCCUS EPIDERMIDIS  Blood Culture ID Panel (Reflexed)     Status: Abnormal   Collection Time: 05/09/23  4:25 PM  Result Value Ref Range Status   Enterococcus faecalis NOT DETECTED NOT DETECTED Final   Enterococcus Faecium NOT DETECTED NOT DETECTED Final   Listeria monocytogenes NOT DETECTED NOT DETECTED Final   Staphylococcus species DETECTED (A) NOT DETECTED Final    Comment: CRITICAL RESULT CALLED TO, READ BACK BY AND VERIFIED WITH: C SHADE,PHARMD@[id number]  05/10/23 MK    Staphylococcus aureus (BCID) NOT DETECTED NOT DETECTED Final   Staphylococcus epidermidis DETECTED (A) NOT DETECTED Final    Comment: Methicillin (oxacillin) resistant coagulase negative staphylococcus. Possible blood culture contaminant (unless isolated from more than one blood culture draw or clinical case suggests pathogenicity). No antibiotic treatment is indicated for blood  culture contaminants. CRITICAL RESULT CALLED TO, READ BACK BY AND VERIFIED WITH: C SHADE,PHARMD@[id number]  05/10/23 MK    Staphylococcus lugdunensis NOT DETECTED NOT DETECTED Final   Streptococcus species NOT DETECTED NOT DETECTED Final   Streptococcus agalactiae NOT DETECTED NOT DETECTED Final   Streptococcus pneumoniae NOT DETECTED NOT DETECTED Final   Streptococcus pyogenes NOT DETECTED NOT DETECTED Final   A.calcoaceticus-baumannii NOT DETECTED  NOT DETECTED Final   Bacteroides fragilis NOT DETECTED NOT DETECTED Final   Enterobacterales NOT DETECTED NOT DETECTED Final   Enterobacter cloacae complex NOT DETECTED NOT DETECTED Final   Escherichia coli NOT DETECTED NOT DETECTED Final   Klebsiella aerogenes NOT DETECTED NOT DETECTED Final   Klebsiella oxytoca NOT DETECTED NOT DETECTED Final   Klebsiella pneumoniae NOT DETECTED NOT DETECTED Final   Proteus species NOT DETECTED NOT DETECTED Final   Salmonella species NOT DETECTED NOT DETECTED Final   Serratia marcescens NOT DETECTED NOT DETECTED Final   Haemophilus influenzae NOT DETECTED NOT DETECTED Final  Neisseria meningitidis NOT DETECTED NOT DETECTED Final   Pseudomonas aeruginosa NOT DETECTED NOT DETECTED Final   Stenotrophomonas maltophilia NOT DETECTED NOT DETECTED Final   Candida albicans NOT DETECTED NOT DETECTED Final   Candida auris NOT DETECTED NOT DETECTED Final   Candida glabrata NOT DETECTED NOT DETECTED Final   Candida krusei NOT DETECTED NOT DETECTED Final   Candida parapsilosis NOT DETECTED NOT DETECTED Final   Candida tropicalis NOT DETECTED NOT DETECTED Final   Cryptococcus neoformans/gattii NOT DETECTED NOT DETECTED Final   Methicillin resistance mecA/C DETECTED (A) NOT DETECTED Final    Comment: CRITICAL RESULT CALLED TO, READ BACK BY AND VERIFIED WITH: C SHADE,PHARMD@[id number]  05/10/23 MK Performed at [facility], [address], [place], [place] [address]   Blood Culture (routine x 2)     Status: Abnormal (Preliminary result)   Collection Time: 05/09/23  4:40 PM   Specimen: BLOOD  Result Value Ref Range Status   Specimen Description   Final    BLOOD LEFT ANTECUBITAL Performed at [facility], [address] [place], [place] [address]    Special Requests   Final    BOTTLES DRAWN AEROBIC AND ANAEROBIC Blood Culture results may not be optimal due to an inadequate volume of blood received in culture bottles Performed at [facility], [address] [place], [place] [address]    Culture  Setup Time   Final    GRAM POSITIVE COCCI IN CHAINS ANAEROBIC BOTTLE ONLY CRITICAL RESULT CALLED TO, READ BACK BY AND VERIFIED WITH: PHARMD CHRISTINE SHADE ON 05/10/23 @ 1239 BY DRT    Culture (A)  Final    STREPTOCOCCUS MITIS/ORALIS SUSCEPTIBILITIES TO FOLLOW Performed at [facility], [address], [place], [place] [address]    Report Status PENDING  Incomplete  Fungus culture, blood     Status: Abnormal (Preliminary result)   Collection Time: 05/09/23  4:40 PM   Specimen: BLOOD  Result Value Ref Range Status   Specimen Description   Final    BLOOD LEFT ANTECUBITAL Performed at [facility], [address] [place], [place] [address]    Special Requests   Final    NONE Performed at [facility], [address], [place], [place] [address]    Fungal Smear   Final    GRAM POSITIVE COCCI IN CLUSTERS AEROBIC BOTTLE ONLY GRAM POSITIVE COCCI IN CHAINS ANAEROBIC BOTTLE ONLY CRITICAL VALUE NOTED.  VALUE IS CONSISTENT WITH PREVIOUSLY REPORTED AND CALLED VALUE.    Culture (A)  Final    STREPTOCOCCUS MITIS/ORALIS STAPHYLOCOCCUS EPIDERMIDIS CULTURE REINCUBATED FOR BETTER GROWTH Performed at [facility], [address], [place], [place] [address]    Report Status PENDING  Incomplete   Organism ID, Bacteria STREPTOCOCCUS MITIS/ORALIS  Final      Susceptibility   Streptococcus mitis/oralis - MIC*    PENICILLIN 0.12 SENSITIVE Sensitive     CEFTRIAXONE <=0.12 SENSITIVE Sensitive     LEVOFLOXACIN 1 SENSITIVE Sensitive     VANCOMYCIN 0.5 SENSITIVE Sensitive     * STREPTOCOCCUS MITIS/ORALIS  Blood Culture ID Panel (Reflexed)     Status: Abnormal   Collection Time: 05/09/23  4:40 PM  Result Value Ref Range Status   Enterococcus faecalis NOT DETECTED NOT DETECTED Final   Enterococcus Faecium NOT DETECTED NOT DETECTED Final   Listeria monocytogenes NOT DETECTED NOT DETECTED  Final   Staphylococcus species NOT DETECTED NOT DETECTED Final   Staphylococcus aureus (BCID)  NOT DETECTED NOT DETECTED Final   Staphylococcus epidermidis NOT DETECTED NOT DETECTED Final   Staphylococcus lugdunensis NOT DETECTED NOT DETECTED Final   Streptococcus species DETECTED (A) NOT DETECTED Final    Comment: Not Enterococcus species, Streptococcus agalactiae, Streptococcus pyogenes, or Streptococcus pneumoniae. CRITICAL RESULT CALLED TO, READ BACK BY AND VERIFIED WITH: PHARMD CHRISTINE SHADE ON 05/10/23 @ 1239 BY DRT    Streptococcus agalactiae NOT DETECTED NOT DETECTED Final   Streptococcus pneumoniae NOT DETECTED NOT DETECTED Final   Streptococcus pyogenes NOT DETECTED NOT DETECTED Final   A.calcoaceticus-baumannii NOT DETECTED NOT DETECTED Final   Bacteroides fragilis NOT DETECTED NOT DETECTED Final   Enterobacterales NOT DETECTED NOT DETECTED Final   Enterobacter cloacae complex NOT DETECTED NOT DETECTED Final   Escherichia coli NOT DETECTED NOT DETECTED Final   Klebsiella aerogenes NOT DETECTED NOT DETECTED Final   Klebsiella oxytoca NOT DETECTED NOT DETECTED Final   Klebsiella pneumoniae NOT DETECTED NOT DETECTED Final   Proteus species NOT DETECTED NOT DETECTED Final   Salmonella species NOT DETECTED NOT DETECTED Final   Serratia marcescens NOT DETECTED NOT DETECTED Final   Haemophilus influenzae NOT DETECTED NOT DETECTED Final   Neisseria meningitidis NOT DETECTED NOT DETECTED Final   Pseudomonas aeruginosa NOT DETECTED NOT DETECTED Final   Stenotrophomonas maltophilia NOT DETECTED NOT DETECTED Final   Candida albicans NOT DETECTED NOT DETECTED Final   Candida auris NOT DETECTED NOT DETECTED Final   Candida glabrata NOT DETECTED NOT DETECTED Final   Candida krusei NOT DETECTED NOT DETECTED Final   Candida parapsilosis NOT DETECTED NOT DETECTED Final   Candida tropicalis NOT DETECTED NOT DETECTED Final   Cryptococcus neoformans/gattii NOT DETECTED NOT DETECTED Final     Comment: Performed at [facility], [address], [place], [place] [address]  Resp panel by RT-PCR (RSV, Flu A&B, Covid) Anterior Nasal Swab     Status: Abnormal   Collection Time: 05/09/23 10:45 PM   Specimen: Anterior Nasal Swab  Result Value Ref Range Status   SARS Coronavirus 2 by RT PCR NEGATIVE NEGATIVE Final    Comment: (NOTE) SARS-CoV-2 target nucleic acids are NOT DETECTED.  The SARS-CoV-2 RNA is generally detectable in upper respiratory specimens during the acute phase of infection. The lowest concentration of SARS-CoV-2 viral copies this assay can detect is 138 copies/mL. A negative result does not preclude SARS-Cov-2 infection and should not be used as the sole basis for treatment or other patient management decisions. A negative result may occur with  improper specimen collection/handling, submission of specimen other than nasopharyngeal swab, presence of viral mutation(s) within the areas targeted by this assay, and inadequate number of viral copies(<138 copies/mL). A negative result must be combined with clinical observations, patient history, and epidemiological information. The expected result is Negative.  Fact Sheet for Patients:  BloggerCourse.com  Fact Sheet for Healthcare Providers:  SeriousBroker.it  This test is no t yet approved or cleared by the [place] FDA and  has been authorized for detection and/or diagnosis of SARS-CoV-2 by FDA under an Emergency Use Authorization (EUA). This EUA will remain  in effect (meaning this test can be used) for the duration of the COVID-19 declaration under Section 564(b)(1) of the Act, 21 U.S.C.section 360bbb-3(b)(1), unless the authorization is terminated  or revoked sooner.       Influenza A by PCR POSITIVE (A) NEGATIVE Final   Influenza B by PCR NEGATIVE NEGATIVE Final    Comment: (NOTE) The Xpert Xpress SARS-CoV-2/FLU/RSV plus assay is intended  as an  aid in the diagnosis of influenza from Nasopharyngeal swab specimens and should not be used as a sole basis for treatment. Nasal washings and aspirates are unacceptable for Xpert Xpress SARS-CoV-2/FLU/RSV testing.  Fact Sheet for Patients: BloggerCourse.com  Fact Sheet for Healthcare Providers: SeriousBroker.it  This test is not yet approved or cleared by the [place] FDA and has been authorized for detection and/or diagnosis of SARS-CoV-2 by FDA under an Emergency Use Authorization (EUA). This EUA will remain in effect (meaning this test can be used) for the duration of the COVID-19 declaration under Section 564(b)(1) of the Act, 21 U.S.C. section 360bbb-3(b)(1), unless the authorization is terminated or revoked.     Resp Syncytial Virus by PCR NEGATIVE NEGATIVE Final    Comment: (NOTE) Fact Sheet for Patients: BloggerCourse.com  Fact Sheet for Healthcare Providers: SeriousBroker.it  This test is not yet approved or cleared by the [place] FDA and has been authorized for detection and/or diagnosis of SARS-CoV-2 by FDA under an Emergency Use Authorization (EUA). This EUA will remain in effect (meaning this test can be used) for the duration of the COVID-19 declaration under Section 564(b)(1) of the Act, 21 U.S.C. section 360bbb-3(b)(1), unless the authorization is terminated or revoked.  Performed at [facility], [address], [place], [place] [address]   Gastrointestinal Panel by PCR , Stool     Status: None   Collection Time: 05/09/23 10:45 PM   Specimen: Anterior Nasal Swab; Stool  Result Value Ref Range Status   Campylobacter species NOT DETECTED NOT DETECTED Final   Plesimonas shigelloides NOT DETECTED NOT DETECTED Final   Salmonella species NOT DETECTED NOT DETECTED Final   Yersinia enterocolitica NOT DETECTED NOT DETECTED Final    Vibrio species NOT DETECTED NOT DETECTED Final   Vibrio cholerae NOT DETECTED NOT DETECTED Final   Enteroaggregative E coli (EAEC) NOT DETECTED NOT DETECTED Final   Enteropathogenic E coli (EPEC) NOT DETECTED NOT DETECTED Final   Enterotoxigenic E coli (ETEC) NOT DETECTED NOT DETECTED Final   Shiga like toxin producing E coli (STEC) NOT DETECTED NOT DETECTED Final   Shigella/Enteroinvasive E coli (EIEC) NOT DETECTED NOT DETECTED Final   Cryptosporidium NOT DETECTED NOT DETECTED Final   Cyclospora cayetanensis NOT DETECTED NOT DETECTED Final   Entamoeba histolytica NOT DETECTED NOT DETECTED Final   Giardia lamblia NOT DETECTED NOT DETECTED Final   Adenovirus F40/41 NOT DETECTED NOT DETECTED Final   Astrovirus NOT DETECTED NOT DETECTED Final   Norovirus GI/GII NOT DETECTED NOT DETECTED Final   Rotavirus A NOT DETECTED NOT DETECTED Final   Sapovirus (I, II, IV, and V) NOT DETECTED NOT DETECTED Final    Comment: Performed at [facility], [address] [place], [place] [address]  MRSA Next Gen by PCR, Nasal     Status: Abnormal   Collection Time: 05/10/23  1:09 AM   Specimen: Nasal Mucosa; Nasal Swab  Result Value Ref Range Status   MRSA by PCR Next Gen DETECTED (A) NOT DETECTED Final    Comment: (NOTE) The GeneXpert MRSA Assay (FDA approved for NASAL specimens only), is one component of a comprehensive MRSA colonization surveillance program. It is not intended to diagnose MRSA infection nor to guide or monitor treatment for MRSA infections. Test performance is not FDA approved in patients less than 58 years old. Performed at [facility], [address] [place], [place] [address]   Respiratory (~20 pathogens) panel by PCR     Status:  Abnormal   Collection Time: 05/10/23 12:46 PM   Specimen: Nasopharyngeal Swab; Respiratory  Result Value Ref Range Status   Adenovirus NOT DETECTED NOT DETECTED Final   Coronavirus 229E NOT DETECTED NOT DETECTED Final     Comment: (NOTE) The Coronavirus on the Respiratory Panel, DOES NOT test for the novel  Coronavirus (2019 nCoV)    Coronavirus HKU1 NOT DETECTED NOT DETECTED Final   Coronavirus NL63 NOT DETECTED NOT DETECTED Final   Coronavirus OC43 NOT DETECTED NOT DETECTED Final   Metapneumovirus NOT DETECTED NOT DETECTED Final   Rhinovirus / Enterovirus NOT DETECTED NOT DETECTED Final   Influenza A H1 2009 DETECTED (A) NOT DETECTED Final   Influenza B NOT DETECTED NOT DETECTED Final   Parainfluenza Virus 1 NOT DETECTED NOT DETECTED Final   Parainfluenza Virus 2 NOT DETECTED NOT DETECTED Final   Parainfluenza Virus 3 NOT DETECTED NOT DETECTED Final   Parainfluenza Virus 4 NOT DETECTED NOT DETECTED Final   Respiratory Syncytial Virus NOT DETECTED NOT DETECTED Final   Bordetella pertussis NOT DETECTED NOT DETECTED Final   Bordetella Parapertussis NOT DETECTED NOT DETECTED Final   Chlamydophila pneumoniae NOT DETECTED NOT DETECTED Final   Mycoplasma pneumoniae NOT DETECTED NOT DETECTED Final    Comment: Performed at [facility], [address] [place], [place] [address]     Radiology Studies: No results found.   Scheduled Meds:  apixaban  5 mg Oral BID   feeding supplement (KATE FARMS STANDARD ENT 1.4)  325 mL Oral BID BM   hydrocortisone sod succinate (SOLU-CORTEF) inj  100 mg Intravenous Daily   insulin aspart  0-6 Units Subcutaneous Q6H   midodrine  10 mg Oral TID WC   mupirocin ointment  1 Application Nasal BID   oseltamivir  75 mg Oral BID   potassium chloride  40 mEq Oral Once   sodium chloride flush  10-40 mL Intracatheter Q12H   Continuous Infusions:  DAPTOmycin Stopped (05/12/23 1534)   dextrose 5% lactated ringers 40 mL/hr at 05/13/23 0700     LOS: 4 days    Time spent: 23 MIN Alwyn Ren, MD  05/13/2023, 8:59 AM

## 2023-05-13 NOTE — Progress Notes (Signed)
PHARMACY - TOTAL PARENTERAL NUTRITION CONSULT NOTE   Indication: Short bowel syndrome  Patient Measurements: Height: 5\' 4"  (162.6 cm) Weight: 47.5 kg (104 lb 11.5 oz) IBW/kg (Calculated) : 54.7 TPN AdjBW (KG): 47.5 Body mass index is 17.97 kg/m. 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 Last administered: 1/27-1/28 1/31 TPN formula from [employer] faxed to [facility]  2/1 per ID notes, unclear if blood culture obtained on 1/29 at 1625 from port showing MRSE is a colonizer of the port or a true pathogen. Patient currently on daptomycin.  Glucose / Insulin: No history of DM. CBG FBS 79. Electrolytes:  K low at 3.4; others WNL Renal: SCr WNL and BUN slightly elevated Hepatic: AST WNL, ALT elevated at 54, alk phos elevated at 164 (and down since 1/29), albumin low at 3.1 Intake / Output; MIVF:  2x unmeasured urine and 2x stool D5LR at 36ml/hr GI Imaging: 1/29 No obstruction. Left-sided ileostomy post colectomy. GI Surgeries / Procedures: none this admission  Central access: prior to admission CVC Double Lumen in place TPN start date: 1/31 x 1 day then held for possible port infection  Nutritional Goals: From [employer], MWF with lipids cyclic TPN formula provides 80 g of protein and 1214 kcals per day 1/31 Inpt TPN with lipids cyclic TPN formula provides 80 g of protein and 1230 kcal per day  RD Assessment:  Estimated Needs Total Energy Estimated Needs: 1650-1800 kcals Total Protein Estimated Needs: 80-95 grams Total Fluid Estimated Needs: >/= 1.7L  Current Nutrition:  Regular diet and TPN [employer] 325 ml BiD between meals  Plan:  Per ID, continue to hold TPN for now KCl 10 mEq q1h x 4 TPN labs tomorrow am   Thank you for allowing pharmacy to be a part of this patient's care.  Selinda Eon, PharmD, BCPS Clinical Pharmacist [facility] 05/13/2023 10:48 AM

## 2023-05-13 NOTE — Plan of Care (Signed)
  Problem: Education: Goal: Knowledge of General Education information will improve Description: Including pain rating scale, medication(s)/side effects and non-pharmacologic comfort measures Outcome: Progressing   Problem: Health Behavior/Discharge Planning: Goal: Ability to manage health-related needs will improve Outcome: Progressing   Problem: Clinical Measurements: Goal: Ability to maintain clinical measurements within normal limits will improve Outcome: Progressing Goal: Will remain free from infection Outcome: Progressing Goal: Diagnostic test results will improve Outcome: Progressing Goal: Respiratory complications will improve Outcome: Progressing Goal: Cardiovascular complication will be avoided Outcome: Progressing   Problem: Clinical Measurements: Goal: Will remain free from infection Outcome: Progressing   Problem: Clinical Measurements: Goal: Diagnostic test results will improve Outcome: Progressing   Problem: Clinical Measurements: Goal: Respiratory complications will improve Outcome: Progressing   Problem: Clinical Measurements: Goal: Cardiovascular complication will be avoided Outcome: Progressing   Problem: Elimination: Goal: Will not experience complications related to bowel motility Outcome: Progressing Goal: Will not experience complications related to urinary retention Outcome: Progressing

## 2023-05-14 DIAGNOSIS — E440 Moderate protein-calorie malnutrition: Secondary | ICD-10-CM | POA: Insufficient documentation

## 2023-05-14 DIAGNOSIS — A419 Sepsis, unspecified organism: Secondary | ICD-10-CM | POA: Diagnosis not present

## 2023-05-14 DIAGNOSIS — R6521 Severe sepsis with septic shock: Secondary | ICD-10-CM | POA: Diagnosis not present

## 2023-05-14 LAB — COMPREHENSIVE METABOLIC PANEL
ALT: 45 U/L — ABNORMAL HIGH (ref 0–44)
AST: 32 U/L (ref 15–41)
Albumin: 3.3 g/dL — ABNORMAL LOW (ref 3.5–5.0)
Alkaline Phosphatase: 156 U/L — ABNORMAL HIGH (ref 38–126)
Anion gap: 9 (ref 5–15)
BUN: 19 mg/dL (ref 6–20)
CO2: 26 mmol/L (ref 22–32)
Calcium: 9.4 mg/dL (ref 8.9–10.3)
Chloride: 103 mmol/L (ref 98–111)
Creatinine, Ser: 0.79 mg/dL (ref 0.44–1.00)
GFR, Estimated: >60 mL/min (ref >60)
Glucose, Bld: 85 mg/dL (ref 70–99)
Potassium: 3.7 mmol/L (ref 3.5–5.1)
Sodium: 138 mmol/L (ref 135–145)
Total Bilirubin: 0.7 mg/dL (ref 0.0–1.2)
Total Protein: 7.7 g/dL (ref 6.5–8.1)

## 2023-05-14 LAB — GLUCOSE, CAPILLARY
Glucose-Capillary: 78 mg/dL (ref 70–99)
Glucose-Capillary: 111 mg/dL — ABNORMAL HIGH (ref 70–99)
Glucose-Capillary: 146 mg/dL — ABNORMAL HIGH (ref 70–99)

## 2023-05-14 LAB — PHOSPHORUS: Phosphorus: 3 mg/dL (ref 2.5–4.6)

## 2023-05-14 LAB — TRIGLYCERIDES: Triglycerides: 195 mg/dL — ABNORMAL HIGH (ref <150)

## 2023-05-14 LAB — MAGNESIUM: Magnesium: 1.9 mg/dL (ref 1.7–2.4)

## 2023-05-14 MED ORDER — KATE FARMS STANDARD 1.4 PO LIQD
325.0000 mL | Freq: Two times a day (BID) | ORAL | Status: DC
  Administered 2023-05-14 – 2023-05-17 (×5): 325 mL via ORAL
  Filled 2023-05-14 (×8): qty 325

## 2023-05-14 NOTE — Hospital Course (Addendum)
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 No obstruction Left-sided ileostomy post colectomy. Small amount of free fluid in the right pelvis is well as adnexa, unchanged from prior exam. Chronic uterine fibroids and  found to be hypotensive unresponsive to IV fluids and albumin was started on vasopressors now off of it, off since 05/12/2023, tachycardic improved after IV fluids.  She received a dose of hydrocortisone and midodrine after she was taken off the pressors for ongoing soft blood pressure. She is being treated with Tamiflu and daptomycin-and ID has been following. She has a chronic right lower quadrant cutaneous drain which was exchanged on #27 2024 status post ostomy. Patient was transferred to [facility] 05/13/2023

## 2023-05-14 NOTE — Plan of Care (Signed)

## 2023-05-14 NOTE — Plan of Care (Signed)
  Problem: Education: Goal: Knowledge of General Education information will improve Description: Including pain rating scale, medication(s)/side effects and non-pharmacologic comfort measures Outcome: Progressing   Problem: Activity: Goal: Risk for activity intolerance will decrease Outcome: Progressing   Problem: Nutrition: Goal: Adequate nutrition will be maintained Outcome: Progressing   Problem: Coping: Goal: Level of anxiety will decrease Outcome: Progressing   Problem: Pain Managment: Goal: General experience of comfort will improve and/or be controlled Outcome: Progressing   Problem: Safety: Goal: Ability to remain free from injury will improve Outcome: Progressing   Problem: Skin Integrity: Goal: Risk for impaired skin integrity will decrease Outcome: Progressing

## 2023-05-14 NOTE — Progress Notes (Signed)
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 No obstruction Left-sided ileostomy post colectomy. Small amount of free fluid in the right pelvis is well as adnexa, unchanged from prior exam. Chronic uterine fibroids and  found to be hypotensive unresponsive to IV fluids and albumin was started on vasopressors now off of it, off since 05/12/2023, tachycardic improved after IV fluids.  She received a dose of hydrocortisone and midodrine after she was taken off the pressors for ongoing soft blood pressure. She is being treated with Tamiflu and daptomycin-and ID has been following. She has a chronic right lower quadrant cutaneous drain which was exchanged on #27 2024 status post ostomy. Patient was transferred to [facility] 05/13/2023  Subjective: Patient seen and examined Overnight afebrile BP holding 80s to 115, labs reviewed from 2/3 and overall stable Resting comfortably No nausea vomiting Having colostomy output.  Blood pressure is chronically in 80s systolic.-And asymptomatic    Assessment and Plan: Principal Problem:   Septic shock (HCC) Active Problems:   Hypotension   On total parenteral nutrition (TPN)   Influenza A   Positive blood culture   Crohn's disease (HCC)   H/O abdominal abscess   Weight loss   Protein-calorie malnutrition, moderate (HCC)   Influenza A  Doing well currently on room air, completed Tamiflu course  History of lower lobe  subsegmental PE in September 2024: Stable continue home Eliquis  Hypotension: Patient did not respond to fluids and needed pressors while in ICU-hypotension likely multifactorial-she has short gut syndrome, influenza A infection.  Chronically on TPN.  Off pressors transition to midodrine and Solu-Cortef and transferred out of ICU.  ?Bacteremia: ?Port-A-Cath infection with Streptococcus submentalis and epidermidis, ID is following likely consistent with contamination of the peripheral stick and colonization of the port.  Await further ID recommendation if it becomes evident that she does have a true bloodstream infection the port will need to be removed and will need catheter holiday.  Overall doing well on empiric daptomycin for now patient has had intra-abdominal abscesses in 2024 however current CT does not show any evidence of abscesses.  Tachycardia: improved with IV fluids.   AKI: resolved with IV fluids   ?Relative adrenal insufficiency: she is on steroids chronically at home currently on stress dose steroids to improve blood pressure AND hopefully transition to home steroid  Moderate malnutrition in the setting of chronic illness Short gut syndrome  On TPN chronically : Currently TPN on hold.  Tolerating p.o. Augmentin PRN   Anemia of chronic disease: HB holding 8.5 g range, monitor   DVT prophylaxis: SCDs Start: 05/09/23 2230 Code Status:   Code Status: Full Code Family Communication: plan of care discussed with patient/ at bedside. Patient status is: Remains hospitalized because of severity of illness Level of care: Telemetry   Dispo: The patient is from: [facility]            Anticipated disposition: TBD Objective: Vitals last 24 hrs: Vitals:   05/14/23 0053 05/14/23 0426 05/14/23 0500 05/14/23 0914  BP: 94/65 (!) 86/63  106/71  Pulse:  72 68  63  Resp: 17 18    Temp: 97.9 F (36.6 C) 97.7 F (36.5 C)  97.9 F (36.6 C)  TempSrc:      SpO2: 100% 100%  100%  Weight:    47.7 kg   Height:       Weight change:   Physical Examination: General exam: alert awake, thin frail  HEENT:Oral mucosa moist, Ear/Nose WNL grossly Respiratory system: Bilaterally clear BS,no use of accessory muscle.  Left chest with port in place Cardiovascular system: S1 & S2 +, No JVD. Gastrointestinal system: Abdomen soft,NT,ND, BS+ left colostomy with stool output Nervous System: Alert, awake, moving all extremities,and following commands. Extremities: LE edema neg,distal peripheral pulses palpable and warm.  Skin: No rashes,no icterus. MSK: Normal muscle bulk,tone, power   Medications reviewed:  Scheduled Meds:  apixaban  5 mg Oral BID   feeding supplement (KATE FARMS STANDARD 1.4)  325 mL Oral BID BM   hydrocortisone sod succinate (SOLU-CORTEF) inj  100 mg Intravenous Daily   midodrine  10 mg Oral TID WC   mupirocin ointment  1 Application Nasal BID   oseltamivir  75 mg Oral BID   sodium chloride flush  10-40 mL Intracatheter Q12H   Continuous Infusions:  DAPTOmycin Stopped (05/13/23 1626)    Diet Order             Diet regular Room service appropriate? Yes; Fluid consistency: Thin  Diet effective now                   Nutrition Problem: Moderate Malnutrition Etiology: chronic illness (Crohn's disease with short gut syndrome) Signs/Symptoms: moderate fat depletion, moderate muscle depletion Interventions: Refer to RD note for recommendations, TPN   Intake/Output Summary (Last 24 hours) at 05/14/2023 1259 Last data filed at 05/14/2023 0900 Gross per 24 hour  Intake 648.97 ml  Output --  Net 648.97 ml   Net IO Since Admission: 8,760.85 mL [05/14/23 1259]  Wt Readings from Last 3 Encounters:  05/14/23 47.7 kg  04/26/23 49.4 kg  02/19/23 48.5 kg     Unresulted Labs (From admission, onward)     Start     Ordered   05/18/23 0500  CK  Weekly,   R     Question:  Specimen collection method  Answer:  IV Team=IV Team collect   05/14/23 1108   05/13/23 0500   Basic metabolic panel  Daily,   R     Question:  Specimen collection method  Answer:  Unit=Unit collect   05/11/23 1244   05/12/23 1230  HIV Antibody (routine testing w rflx)  Once,   R        05/12/23 1230          Data Reviewed: I have personally reviewed following labs and imaging studies CBC: Recent Labs  Lab 05/09/23 1625 05/09/23 2245 05/11/23 0645 05/13/23 0649  WBC 19.4* 24.0* 9.5 7.9  NEUTROABS 18.3*  --   --   --   HGB 10.8* 9.3* 8.3* 8.5*  HCT 33.8* 29.1* 26.6* 28.0*  MCV 84.9 85.1 87.5 87.5  PLT 255 258 221 270   Basic Metabolic Panel:  Recent Labs  Lab 05/09/23 1625 05/09/23 2245 05/11/23 0645 05/12/23 1156 05/13/23 0649 05/14/23 0309  NA 131*  --  134* 138 136 138  K 3.6  --  3.1* 4.0 3.4* 3.7  CL 96*  --  99 100 100 103  CO2 20*  --  28 27 26  26  GLUCOSE 97  --  124* 130* 236* 85  BUN 34*  --  10 22* 23* 19  CREATININE 1.40* 1.22* 0.91 0.80 0.94 0.79  CALCIUM 9.1  --  8.6* 9.4 9.0 9.4  MG  --   --  1.7 2.2 2.0 1.9  PHOS  --   --  2.9 2.4* 3.6 3.0   GFR: Estimated Creatinine Clearance: 66.9 mL/min (by C-G formula based on SCr of 0.79 mg/dL). Liver Function Tests:  Recent Labs  Lab 05/09/23 1625 05/12/23 1156 05/14/23 0309  AST 88* 28 32  ALT 111* 54* 45*  ALKPHOS 294* 164* 156*  BILITOT 1.4* 0.8 0.7  PROT 8.5* 7.3 7.7  ALBUMIN 3.5 3.1* 3.3*   No results for input(s): "LIPASE", "AMYLASE" in the last 168 hours. No results for input(s): "AMMONIA" in the last 168 hours. Coagulation Profile:  Recent Labs  Lab 05/09/23 1625  INR 1.2   Recent Labs  Lab 05/12/23 2330 05/13/23 0540 05/13/23 1139 05/13/23 1804 05/14/23 0801  GLUCAP 102* 79 132* 116* 78   Recent Labs    05/14/23 0309  TRIG 195*   No results for input(s): "TSH", "T4TOTAL", "FREET4", "T3FREE", "THYROIDAB" in the last 72 hours. Sepsis Labs: Recent Labs  Lab 05/09/23 1635 05/12/23 1156  PROCALCITON  --  53.99  LATICACIDVEN 1.0  --    Recent Results (from the past  240 hours)  Blood Culture (routine x 2)     Status: Abnormal   Collection Time: 05/09/23  4:25 PM   Specimen: BLOOD  Result Value Ref Range Status   Specimen Description   Final    BLOOD PORTA CATH Performed at [facility], [address] [place], [place] [address]    Special Requests   Final    BOTTLES DRAWN AEROBIC AND ANAEROBIC Blood Culture results may not be optimal due to an inadequate volume of blood received in culture bottles Performed at [facility], [address] [place], [place] [address]    Culture  Setup Time   Final    GRAM POSITIVE COCCI IN BOTH AEROBIC AND ANAEROBIC BOTTLES CRITICAL RESULT CALLED TO, READ BACK BY AND VERIFIED WITH: C SHADE,[email or web address]@[id number]  05/10/23 MK Performed at [facility], [address] [place], [place] [address]    Culture STAPHYLOCOCCUS EPIDERMIDIS (A)  Final   Report Status 05/12/2023 FINAL  Final   Organism ID, Bacteria STAPHYLOCOCCUS EPIDERMIDIS  Final      Susceptibility   Staphylococcus epidermidis - MIC*    CIPROFLOXACIN 4 RESISTANT Resistant     ERYTHROMYCIN >=8 RESISTANT Resistant     GENTAMICIN >=16 RESISTANT Resistant     OXACILLIN >=4 RESISTANT Resistant     TETRACYCLINE 2 SENSITIVE Sensitive     VANCOMYCIN 1 SENSITIVE Sensitive     TRIMETH/SULFA 40 SENSITIVE Sensitive     CLINDAMYCIN <=0.25 SENSITIVE Sensitive     RIFAMPIN <=0.5 SENSITIVE Sensitive     Inducible Clindamycin NEGATIVE Sensitive     * STAPHYLOCOCCUS EPIDERMIDIS  Blood Culture ID Panel (Reflexed)     Status: Abnormal   Collection Time: 05/09/23  4:25 PM  Result Value Ref Range Status   Enterococcus faecalis NOT DETECTED NOT DETECTED Final   Enterococcus Faecium NOT DETECTED NOT DETECTED Final   Listeria monocytogenes NOT DETECTED NOT DETECTED Final   Staphylococcus species DETECTED (A) NOT DETECTED Final    Comment: CRITICAL RESULT CALLED TO, READ BACK BY AND VERIFIED WITH: C SHADE,PHARMD@[id number]  05/10/23 MK  Staphylococcus aureus (BCID) NOT DETECTED NOT DETECTED Final   Staphylococcus epidermidis DETECTED (A) NOT DETECTED Final    Comment: Methicillin (oxacillin) resistant coagulase negative staphylococcus. Possible blood culture contaminant (unless isolated from more than one blood culture draw or clinical case suggests pathogenicity). No antibiotic treatment is indicated for blood  culture contaminants. CRITICAL RESULT CALLED TO, READ BACK BY AND VERIFIED WITH: C SHADE,PHARMD@[id number]  05/10/23 MK    Staphylococcus lugdunensis NOT DETECTED NOT DETECTED Final   Streptococcus species NOT DETECTED NOT DETECTED Final   Streptococcus agalactiae NOT DETECTED NOT DETECTED Final   Streptococcus pneumoniae NOT DETECTED NOT DETECTED Final   Streptococcus pyogenes NOT DETECTED NOT DETECTED Final   A.calcoaceticus-baumannii NOT DETECTED NOT DETECTED Final   Bacteroides fragilis NOT DETECTED NOT DETECTED Final   Enterobacterales NOT DETECTED NOT DETECTED Final   Enterobacter cloacae complex NOT DETECTED NOT DETECTED Final   Escherichia coli NOT DETECTED NOT DETECTED Final   Klebsiella aerogenes NOT DETECTED NOT DETECTED Final   Klebsiella oxytoca NOT DETECTED NOT DETECTED Final   Klebsiella pneumoniae NOT DETECTED NOT DETECTED Final   Proteus species NOT DETECTED NOT DETECTED Final   Salmonella species NOT DETECTED NOT DETECTED Final   Serratia marcescens NOT DETECTED NOT DETECTED Final   Haemophilus influenzae NOT DETECTED NOT DETECTED Final   Neisseria meningitidis NOT DETECTED NOT DETECTED Final   Pseudomonas aeruginosa NOT DETECTED NOT DETECTED Final   Stenotrophomonas maltophilia NOT DETECTED NOT DETECTED Final   Candida albicans NOT DETECTED NOT DETECTED Final   Candida auris NOT DETECTED NOT DETECTED Final   Candida glabrata NOT DETECTED NOT DETECTED Final   Candida krusei NOT DETECTED NOT DETECTED Final   Candida parapsilosis NOT DETECTED NOT DETECTED Final   Candida tropicalis NOT DETECTED NOT  DETECTED Final   Cryptococcus neoformans/gattii NOT DETECTED NOT DETECTED Final   Methicillin resistance mecA/C DETECTED (A) NOT DETECTED Final    Comment: CRITICAL RESULT CALLED TO, READ BACK BY AND VERIFIED WITH: C SHADE,PHARMD@[id number]  05/10/23 MK Performed at [facility], [address], [place], [place] [address]   Blood Culture (routine x 2)     Status: Abnormal   Collection Time: 05/09/23  4:40 PM   Specimen: BLOOD  Result Value Ref Range Status   Specimen Description   Final    BLOOD LEFT ANTECUBITAL Performed at [facility], [address], [place], [place] [address]    Special Requests   Final    BOTTLES DRAWN AEROBIC AND ANAEROBIC Blood Culture results may not be optimal due to an inadequate volume of blood received in culture bottles Performed at [facility], [address] [place], [place] [address]    Culture  Setup Time   Final    GRAM POSITIVE COCCI IN CHAINS ANAEROBIC BOTTLE ONLY CRITICAL RESULT CALLED TO, READ BACK BY AND VERIFIED WITH: PHARMD CHRISTINE SHADE ON 05/10/23 @ 1239 BY DRT Performed at [facility], [address] [place], [place] [address]    Culture STREPTOCOCCUS MITIS/ORALIS (A)  Final   Report Status 05/13/2023 FINAL  Final   Organism ID, Bacteria STREPTOCOCCUS MITIS/ORALIS  Final      Susceptibility   Streptococcus mitis/oralis - MIC*    PENICILLIN <=0.06 SENSITIVE Sensitive     CEFTRIAXONE <=0.12 SENSITIVE Sensitive     LEVOFLOXACIN 1 SENSITIVE Sensitive     VANCOMYCIN 0.5 SENSITIVE Sensitive     * STREPTOCOCCUS MITIS/ORALIS  Fungus culture, blood     Status: Abnormal (Preliminary result)  Collection Time: 05/09/23  4:40 PM   Specimen: BLOOD  Result Value Ref Range Status   Specimen Description   Final    BLOOD LEFT ANTECUBITAL Performed at [facility], [address] [place], [place] [address]    Special Requests   Final    NONE Performed at [facility], [address], [place], [place] [address]    Fungal Smear   Final    GRAM POSITIVE COCCI IN CLUSTERS AEROBIC BOTTLE ONLY GRAM POSITIVE COCCI IN CHAINS ANAEROBIC BOTTLE ONLY CRITICAL VALUE NOTED.  VALUE IS CONSISTENT WITH PREVIOUSLY REPORTED AND CALLED VALUE.    Culture (A)  Final    STREPTOCOCCUS MITIS/ORALIS STAPHYLOCOCCUS EPIDERMIDIS REPEATING SUSCEPTIBILITY Performed at [facility], [address], [place], [place] [address]    Report Status PENDING  Incomplete   Organism ID, Bacteria STREPTOCOCCUS MITIS/ORALIS  Final      Susceptibility   Streptococcus mitis/oralis - MIC*    PENICILLIN 0.12 SENSITIVE Sensitive     CEFTRIAXONE <=0.12 SENSITIVE Sensitive     LEVOFLOXACIN 1 SENSITIVE Sensitive     VANCOMYCIN 0.5 SENSITIVE Sensitive     * STREPTOCOCCUS MITIS/ORALIS  Blood Culture ID Panel (Reflexed)     Status: Abnormal   Collection Time: 05/09/23  4:40 PM  Result Value Ref Range Status   Enterococcus faecalis NOT DETECTED NOT DETECTED Final   Enterococcus Faecium NOT DETECTED NOT DETECTED Final   Listeria monocytogenes NOT DETECTED NOT DETECTED Final   Staphylococcus species NOT DETECTED NOT DETECTED Final   Staphylococcus aureus (BCID) NOT DETECTED NOT DETECTED Final   Staphylococcus epidermidis NOT DETECTED NOT DETECTED Final   Staphylococcus lugdunensis NOT DETECTED NOT DETECTED Final   Streptococcus species DETECTED (A) NOT DETECTED Final    Comment: Not Enterococcus species, Streptococcus agalactiae, Streptococcus pyogenes, or Streptococcus pneumoniae. CRITICAL RESULT CALLED TO, READ BACK BY AND VERIFIED WITH: PHARMD CHRISTINE SHADE ON 05/10/23 @ 1239 BY DRT    Streptococcus agalactiae NOT DETECTED NOT DETECTED Final   Streptococcus pneumoniae NOT DETECTED NOT DETECTED Final   Streptococcus pyogenes NOT DETECTED NOT DETECTED Final   A.calcoaceticus-baumannii NOT DETECTED NOT DETECTED Final   Bacteroides fragilis NOT DETECTED NOT DETECTED Final   Enterobacterales  NOT DETECTED NOT DETECTED Final   Enterobacter cloacae complex NOT DETECTED NOT DETECTED Final   Escherichia coli NOT DETECTED NOT DETECTED Final   Klebsiella aerogenes NOT DETECTED NOT DETECTED Final   Klebsiella oxytoca NOT DETECTED NOT DETECTED Final   Klebsiella pneumoniae NOT DETECTED NOT DETECTED Final   Proteus species NOT DETECTED NOT DETECTED Final   Salmonella species NOT DETECTED NOT DETECTED Final   Serratia marcescens NOT DETECTED NOT DETECTED Final   Haemophilus influenzae NOT DETECTED NOT DETECTED Final   Neisseria meningitidis NOT DETECTED NOT DETECTED Final   Pseudomonas aeruginosa NOT DETECTED NOT DETECTED Final   Stenotrophomonas maltophilia NOT DETECTED NOT DETECTED Final   Candida albicans NOT DETECTED NOT DETECTED Final   Candida auris NOT DETECTED NOT DETECTED Final   Candida glabrata NOT DETECTED NOT DETECTED Final   Candida krusei NOT DETECTED NOT DETECTED Final   Candida parapsilosis NOT DETECTED NOT DETECTED Final   Candida tropicalis NOT DETECTED NOT DETECTED Final   Cryptococcus neoformans/gattii NOT DETECTED NOT DETECTED Final    Comment: Performed at [facility], [address] [place], [place] [address]  Resp panel by RT-PCR (RSV, Flu A&B, Covid) Anterior Nasal Swab     Status: Abnormal   Collection  Time: 05/09/23 10:45 PM   Specimen: Anterior Nasal Swab  Result Value Ref Range Status   SARS Coronavirus 2 by RT PCR NEGATIVE NEGATIVE Final    Comment: (NOTE) SARS-CoV-2 target nucleic acids are NOT DETECTED.  The SARS-CoV-2 RNA is generally detectable in upper respiratory specimens during the acute phase of infection. The lowest concentration of SARS-CoV-2 viral copies this assay can detect is 138 copies/mL. A negative result does not preclude SARS-Cov-2 infection and should not be used as the sole basis for treatment or other patient management decisions. A negative result may occur with  improper specimen collection/handling, submission of  specimen other than nasopharyngeal swab, presence of viral mutation(s) within the areas targeted by this assay, and inadequate number of viral copies(<138 copies/mL). A negative result must be combined with clinical observations, patient history, and epidemiological information. The expected result is Negative.  Fact Sheet for Patients:  BloggerCourse.com  Fact Sheet for Healthcare Providers:  SeriousBroker.it  This test is no t yet approved or cleared by the [place] FDA and  has been authorized for detection and/or diagnosis of SARS-CoV-2 by FDA under an Emergency Use Authorization (EUA). This EUA will remain  in effect (meaning this test can be used) for the duration of the COVID-19 declaration under Section 564(b)(1) of the Act, 21 U.S.C.section 360bbb-3(b)(1), unless the authorization is terminated  or revoked sooner.       Influenza A by PCR POSITIVE (A) NEGATIVE Final   Influenza B by PCR NEGATIVE NEGATIVE Final    Comment: (NOTE) The Xpert Xpress SARS-CoV-2/FLU/RSV plus assay is intended as an aid in the diagnosis of influenza from Nasopharyngeal swab specimens and should not be used as a sole basis for treatment. Nasal washings and aspirates are unacceptable for Xpert Xpress SARS-CoV-2/FLU/RSV testing.  Fact Sheet for Patients: BloggerCourse.com  Fact Sheet for Healthcare Providers: SeriousBroker.it  This test is not yet approved or cleared by the [place] FDA and has been authorized for detection and/or diagnosis of SARS-CoV-2 by FDA under an Emergency Use Authorization (EUA). This EUA will remain in effect (meaning this test can be used) for the duration of the COVID-19 declaration under Section 564(b)(1) of the Act, 21 U.S.C. section 360bbb-3(b)(1), unless the authorization is terminated or revoked.     Resp Syncytial Virus by PCR NEGATIVE NEGATIVE  Final    Comment: (NOTE) Fact Sheet for Patients: BloggerCourse.com  Fact Sheet for Healthcare Providers: SeriousBroker.it  This test is not yet approved or cleared by the [place] FDA and has been authorized for detection and/or diagnosis of SARS-CoV-2 by FDA under an Emergency Use Authorization (EUA). This EUA will remain in effect (meaning this test can be used) for the duration of the COVID-19 declaration under Section 564(b)(1) of the Act, 21 U.S.C. section 360bbb-3(b)(1), unless the authorization is terminated or revoked.  Performed at [facility], [address], [place], [place] [address]   Gastrointestinal Panel by PCR , Stool     Status: None   Collection Time: 05/09/23 10:45 PM   Specimen: Anterior Nasal Swab; Stool  Result Value Ref Range Status   Campylobacter species NOT DETECTED NOT DETECTED Final   Plesimonas shigelloides NOT DETECTED NOT DETECTED Final   Salmonella species NOT DETECTED NOT DETECTED Final   Yersinia enterocolitica NOT DETECTED NOT DETECTED Final   Vibrio species NOT DETECTED NOT DETECTED Final   Vibrio cholerae NOT DETECTED NOT DETECTED Final   Enteroaggregative E coli (EAEC) NOT DETECTED NOT DETECTED Final  Enteropathogenic E coli (EPEC) NOT DETECTED NOT DETECTED Final   Enterotoxigenic E coli (ETEC) NOT DETECTED NOT DETECTED Final   Shiga like toxin producing E coli (STEC) NOT DETECTED NOT DETECTED Final   Shigella/Enteroinvasive E coli (EIEC) NOT DETECTED NOT DETECTED Final   Cryptosporidium NOT DETECTED NOT DETECTED Final   Cyclospora cayetanensis NOT DETECTED NOT DETECTED Final   Entamoeba histolytica NOT DETECTED NOT DETECTED Final   Giardia lamblia NOT DETECTED NOT DETECTED Final   Adenovirus F40/41 NOT DETECTED NOT DETECTED Final   Astrovirus NOT DETECTED NOT DETECTED Final   Norovirus GI/GII NOT DETECTED NOT DETECTED Final   Rotavirus A NOT DETECTED NOT  DETECTED Final   Sapovirus (I, II, IV, and V) NOT DETECTED NOT DETECTED Final    Comment: Performed at [facility], [address] [place], [place] [address]  MRSA Next Gen by PCR, Nasal     Status: Abnormal   Collection Time: 05/10/23  1:09 AM   Specimen: Nasal Mucosa; Nasal Swab  Result Value Ref Range Status   MRSA by PCR Next Gen DETECTED (A) NOT DETECTED Final    Comment: (NOTE) The GeneXpert MRSA Assay (FDA approved for NASAL specimens only), is one component of a comprehensive MRSA colonization surveillance program. It is not intended to diagnose MRSA infection nor to guide or monitor treatment for MRSA infections. Test performance is not FDA approved in patients less than 47 years old. Performed at [facility], [address], [place], [place] [address]   Respiratory (~20 pathogens) panel by PCR     Status: Abnormal   Collection Time: 05/10/23 12:46 PM   Specimen: Nasopharyngeal Swab; Respiratory  Result Value Ref Range Status   Adenovirus NOT DETECTED NOT DETECTED Final   Coronavirus 229E NOT DETECTED NOT DETECTED Final    Comment: (NOTE) The Coronavirus on the Respiratory Panel, DOES NOT test for the novel  Coronavirus (2019 nCoV)    Coronavirus HKU1 NOT DETECTED NOT DETECTED Final   Coronavirus NL63 NOT DETECTED NOT DETECTED Final   Coronavirus OC43 NOT DETECTED NOT DETECTED Final   Metapneumovirus NOT DETECTED NOT DETECTED Final   Rhinovirus / Enterovirus NOT DETECTED NOT DETECTED Final   Influenza A H1 2009 DETECTED (A) NOT DETECTED Final   Influenza B NOT DETECTED NOT DETECTED Final   Parainfluenza Virus 1 NOT DETECTED NOT DETECTED Final   Parainfluenza Virus 2 NOT DETECTED NOT DETECTED Final   Parainfluenza Virus 3 NOT DETECTED NOT DETECTED Final   Parainfluenza Virus 4 NOT DETECTED NOT DETECTED Final   Respiratory Syncytial Virus NOT DETECTED NOT DETECTED Final   Bordetella pertussis NOT DETECTED NOT DETECTED Final   Bordetella  Parapertussis NOT DETECTED NOT DETECTED Final   Chlamydophila pneumoniae NOT DETECTED NOT DETECTED Final   Mycoplasma pneumoniae NOT DETECTED NOT DETECTED Final    Comment: Performed at [facility], [address] [place], [place] [address]    Antimicrobials/Microbiology: Anti-infectives (From admission, onward)    Start     Dose/Rate Route Frequency Ordered Stop   05/12/23 2200  oseltamivir (TAMIFLU) capsule 75 mg       Placed in "Followed by" Linked Group   75 mg Oral 2 times daily 05/12/23 1537 05/15/23 0959   05/10/23 2300  fluconazole (DIFLUCAN) IVPB 200 mg  Status:  Discontinued       Placed in "Followed by" Linked Group   200 mg 100 mL/hr over 60 Minutes Intravenous Every 24 hours 05/09/23 2249 05/10/23 1443  05/10/23 2200  oseltamivir (TAMIFLU) capsule 30 mg  Status:  Discontinued       Placed in "Followed by" Linked Group   30 mg Oral 2 times daily 05/10/23 0844 05/12/23 1537   05/10/23 1530  DAPTOmycin (CUBICIN) IVPB 500 mg/28mL premix        500 mg 100 mL/hr over 30 Minutes Intravenous Daily 05/10/23 1443     05/10/23 1000  ceFEPIme (MAXIPIME) 2 g in sodium chloride 0.9 % 100 mL IVPB  Status:  Discontinued        2 g 200 mL/hr over 30 Minutes Intravenous Every 12 hours 05/10/23 0503 05/10/23 1443   05/10/23 1000  oseltamivir (TAMIFLU) capsule 75 mg  Status:  Discontinued        75 mg Oral 2 times daily 05/10/23 0838 05/10/23 0845   05/10/23 0930  oseltamivir (TAMIFLU) capsule 75 mg       Placed in "Followed by" Linked Group   75 mg Oral  Once 05/10/23 0844 05/10/23 0938   05/10/23 0900  linezolid (ZYVOX) IVPB 600 mg  Status:  Discontinued        600 mg 300 mL/hr over 60 Minutes Intravenous Every 12 hours 05/10/23 0758 05/10/23 1443   05/10/23 0600  metroNIDAZOLE (FLAGYL) IVPB 500 mg  Status:  Discontinued        500 mg 100 mL/hr over 60 Minutes Intravenous Every 12 hours 05/10/23 0456 05/10/23 1443   05/09/23 2300  piperacillin-tazobactam (ZOSYN) IVPB 3.375 g   Status:  Discontinued        3.375 g 12.5 mL/hr over 240 Minutes Intravenous Every 8 hours 05/09/23 2244 05/10/23 0456   05/09/23 2300  fluconazole (DIFLUCAN) IVPB 400 mg       Placed in "Followed by" Linked Group   400 mg 100 mL/hr over 120 Minutes Intravenous  Once 05/09/23 2249 05/10/23 0855   05/09/23 2215  ceFEPIme (MAXIPIME) 2 g in sodium chloride 0.9 % 100 mL IVPB  Status:  Discontinued        2 g 200 mL/hr over 30 Minutes Intravenous  Once 05/09/23 2214 05/09/23 2237   05/09/23 1715  vancomycin (VANCOCIN) IVPB 1000 mg/200 mL premix        1,000 mg 200 mL/hr over 60 Minutes Intravenous  Once 05/09/23 1706 05/09/23 2338   05/09/23 1715  ceFEPIme (MAXIPIME) 2 g in sodium chloride 0.9 % 100 mL IVPB  Status:  Discontinued        2 g 200 mL/hr over 30 Minutes Intravenous  Once 05/09/23 1706 05/09/23 2214         Component Value Date/Time   SDES  05/09/2023 1640    BLOOD LEFT ANTECUBITAL Performed at [facility], [address], [place], [place] [address]    SDES  05/09/2023 1640    BLOOD LEFT ANTECUBITAL Performed at [facility], [address] [place], [place] [address]    SPECREQUEST  05/09/2023 1640    BOTTLES DRAWN AEROBIC AND ANAEROBIC Blood Culture results may not be optimal due to an inadequate volume of blood received in culture bottles Performed at [facility], [address] [place], [place] [address]    SPECREQUEST  05/09/2023 1640    NONE Performed at [facility], [address], [place], [place] [address]    CULT STREPTOCOCCUS MITIS/ORALIS (A) 05/09/2023 1640   CULT (A) 05/09/2023 1640    STREPTOCOCCUS MITIS/ORALIS STAPHYLOCOCCUS EPIDERMIDIS REPEATING SUSCEPTIBILITY Performed at [facility],  [address] [place], [place] [address]    REPTSTATUS 05/13/2023 FINAL 05/09/2023 1640   REPTSTATUS PENDING 05/09/2023 1640     Radiology Studies: No results found.   LOS: 5 days    Total time spent in review of labs and imaging, patient evaluation, formulation of plan, documentation and communication with family: 35 minutes  Lanae Boast, MD  [facility]  05/14/2023, 12:59 PM

## 2023-05-14 NOTE — Progress Notes (Signed)
      Date: 05/14/2023  Patient name: [name]  Medical record number: [id number]  Date of birth: 10/05/1975   The sensitivity  on the Staphylococcus epidermidis that itis growing from the fungal culture taken from the right antecubital fossa is NOT yet back  IF its S are the same as those from the port--we will need to consider her to have a port infection and would remove the port and give a catheter holiday  If their S are discordant would deem both to be contaminants along with the strep and dc her antibacterial abx  Acey Lav 05/14/2023, 2:11 PM

## 2023-05-14 NOTE — Plan of Care (Signed)

## 2023-05-14 NOTE — TOC Progression Note (Signed)
Transition of Care [facility]) - Progression Note    Patient Details  Name: [name] MRN: [id number] Date of Birth: 08/07/1975  Transition of [facility] CM/SW Contact  Diona Browner, [place] Phone Number: 05/14/2023, 1:51 PM  Clinical Narrative:     CSW verified that TPN and [facility] services are both provided with [facility]. Will need resumption of care orders for [facility] prior to d/c.TOC will continue to follow.  Expected Discharge Plan: Home w Home Health Services Barriers to Discharge: Continued Medical Work up  Expected Discharge Plan and Services   Discharge Planning Services: CM Consult Post Acute Care Choice: Resumption of Svcs/PTA Provider (home TPN w/ [facility]; [facility]) Living arrangements for the past 2 months: Single Family Home                                       Social Determinants of Health (SDOH) Interventions SDOH Screenings   Food Insecurity: No Food Insecurity (05/13/2023)  Housing: Low Risk  (05/13/2023)  Transportation Needs: No Transportation Needs (05/13/2023)  Utilities: Not At Risk (05/13/2023)  Tobacco Use: Medium Risk (05/09/2023)    Readmission Risk Interventions    05/13/2023   12:48 PM 12/20/2022    3:50 PM 12/15/2022    2:43 PM  Readmission Risk Prevention Plan  Transportation Screening Complete Complete Complete  PCP or Specialist Appt within 3-5 Days   Complete  HRI or Home Care Consult   Complete  Social Work Consult for Recovery Care Planning/Counseling   Complete  Palliative Care Screening   Complete  Medication Review [employer]) Complete Complete Complete  PCP or Specialist appointment within 3-5 days of discharge  Complete   HRI or Home Care Consult Complete Complete   SW Recovery Care/Counseling Consult Complete Complete   Palliative Care Screening Not Applicable Complete   Skilled Nursing Facility Not Applicable Not Applicable

## 2023-05-15 DIAGNOSIS — R6521 Severe sepsis with septic shock: Secondary | ICD-10-CM | POA: Diagnosis not present

## 2023-05-15 DIAGNOSIS — A419 Sepsis, unspecified organism: Secondary | ICD-10-CM | POA: Diagnosis not present

## 2023-05-15 LAB — FUNGUS CULTURE, BLOOD

## 2023-05-15 LAB — BASIC METABOLIC PANEL
Anion gap: 12 (ref 5–15)
BUN: 26 mg/dL — ABNORMAL HIGH (ref 6–20)
CO2: 26 mmol/L (ref 22–32)
Calcium: 9.8 mg/dL (ref 8.9–10.3)
Chloride: 100 mmol/L (ref 98–111)
Creatinine, Ser: 1.07 mg/dL — ABNORMAL HIGH (ref 0.44–1.00)
GFR, Estimated: >60 mL/min (ref >60)
Glucose, Bld: 84 mg/dL (ref 70–99)
Potassium: 3.9 mmol/L (ref 3.5–5.1)
Sodium: 138 mmol/L (ref 135–145)

## 2023-05-15 LAB — GLUCOSE, CAPILLARY: Glucose-Capillary: 88 mg/dL (ref 70–99)

## 2023-05-15 NOTE — Plan of Care (Signed)
   Problem: Education: Goal: Knowledge of General Education information will improve Description: Including pain rating scale, medication(s)/side effects and non-pharmacologic comfort measures Outcome: Progressing   Problem: Activity: Goal: Risk for activity intolerance will decrease Outcome: Progressing   Problem: Nutrition: Goal: Adequate nutrition will be maintained Outcome: Progressing

## 2023-05-15 NOTE — Plan of Care (Signed)

## 2023-05-15 NOTE — Progress Notes (Signed)
 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 No obstruction Left-sided ileostomy post colectomy. Small amount of free fluid in the right pelvis is well as adnexa, unchanged from prior exam. Chronic uterine fibroids and  found to be hypotensive unresponsive to IV fluids and albumin  was started on vasopressors now off of it, off since 05/12/2023, tachycardic improved after IV fluids.  She received a dose of hydrocortisone  and midodrine  after she was taken off the pressors for ongoing soft blood pressure. She is being treated with Tamiflu  and daptomycin -and ID has been following. She has a chronic right lower quadrant cutaneous drain which was exchanged on #27 2024 status post ostomy.  Patient today for influenza A Patient was transferred to [facility] 05/13/2023.  ID was following continued on IV antibiotics due to bacteremia-growing in staph epidermidis from right antecubital fossa and staph also from the port. Initially not clear if it was back to bacteremia versus contamination but subsequently staph epidermidis has 2 different sensitivity patterns and per Dr. Fleeta Rothman this is consistent with contamination antibiotic discontinued no need for further hospitalization.  Patient has been eager to go home for several days now she has been doing well with no fever no chills tolerating diet> on discharge will resume her home regimen of her nutrition and her  port.  Subjective: Patient seen and examined Overnight afebrile BP holding 80s to 115, labs reviewed from 2/3 and overall stable Resting comfortably No nausea vomiting Having colostomy output.  Blood pressure is chronically in 80s systolic.-And asymptomatic    Assessment and Plan: Principal Problem:   Septic shock (HCC) Active Problems:   Hypotension   On total parenteral nutrition (TPN)   Influenza A   Positive blood culture   Crohn's disease (HCC)   H/O abdominal abscess   Weight loss   Protein-calorie malnutrition, moderate (HCC)    Influenza A  Doing well currently on room air, completed Tamiflu  course   History of lower lobe subsegmental PE in September 2024: Stable continue home Eliquis    Hypotension: Patient did not respond to fluids and needed pressors while in ICU-hypotension likely multifactorial-she has short gut syndrome, influenza A infection.  Chronically on TPN.  Off pressors transition to midodrine  and Solu-Cortef  and transferred out of ICU.  She was placed on stress dose steroid on 2/1 will discontinue and was next 24 hours continue midodrine    ?Bacteremia: ?Port-A-Cath infection with Streptococcus submentalis and epidermidis, ID is following likely consistent with contamination of the peripheral stick and colonization of the port.  Managed with empiric daptomycin  patient has had intra-abdominal abscesses in 2024 however current CT does not show any evidence of abscesses. Staph epidermidis has 2 different sensitivity patterns and per Dr. Fleeta Rothman this is consistent with contamination antibiotic discontinued no need for further hospitalization.  Patient has been eager to go home for several days now she has been doing well with no fever no chills tolerating diet> on discharge will resume her home regimen of her nutrition and her port.   Tachycardia: improved with IV fluids.   AKI:  resolved with IV fluids   ?Relative adrenal insufficiency: she is on steroids  chronically at home currently on stress dose steroids to improve blood pressure AND hopefully transition to home steroid   Moderate malnutrition in the setting of chronic illness Short gut syndrome  On TPN chronically : Currently TPN on hold.  Since no concern for line infection consult pharmacy to resume TPN   Anemia of chronic disease: HB holding 8.5 g range, monitor  DVT prophylaxis: SCDs Start: 05/09/23 2230 Code Status:   Code Status: Full Code Family Communication: plan of care discussed with patient/ at bedside. Patient status is: Remains hospitalized because of severity of illness Level of care: Telemetry   Dispo: The patient is from: [facility]            Anticipated disposition: Anticipate discharge home in next 24 hours vital stable   Objective: Vitals last 24 hrs: Vitals:   05/14/23 1828 05/14/23 1920 05/15/23 0353 05/15/23 0500  BP: 92/75 96/78 94/70    Pulse: 79 89 92   Resp:  18 18   Temp:  98.1 F (36.7 C) 97.7 F (36.5 C)   TempSrc:      SpO2:  100% 100%   Weight:    42.9 kg  Height:       Weight change: -4.8 kg  Physical Examination: General exam: alert awake, thin and frail HEENT:Oral mucosa moist, Ear/Nose WNL grossly Respiratory system: Bilaterally clear BS,no use of accessory muscle Cardiovascular system: S1 & S2 +, No JVD. Gastrointestinal system: Abdomen soft,NT,ND, BS+ colostomy in place Nervous System: Alert, awake, moving all extremities,and following commands. Extremities: LE edema neg,distal peripheral pulses palpable and warm.  Skin: No rashes,no icterus. MSK: Normal muscle bulk,tone, power    Medications reviewed:  Scheduled Meds:  apixaban   5 mg Oral BID   feeding supplement (KATE FARMS STANDARD 1.4)  325 mL Oral BID BM   midodrine   10 mg Oral TID WC   mupirocin  ointment  1 Application Nasal BID   sodium chloride  flush  10-40 mL Intracatheter Q12H   Continuous Infusions:    Diet Order             Diet regular Room service  appropriate? Yes; Fluid consistency: Thin  Diet effective now                   Nutrition Problem: Moderate Malnutrition Etiology: chronic illness (Crohn's disease with short gut syndrome) Signs/Symptoms: moderate fat depletion, moderate muscle depletion Interventions: Refer to RD note for recommendations, TPN   Intake/Output Summary (Last 24 hours) at 05/15/2023 1313 Last data filed at 05/14/2023 1700 Gross per 24 hour  Intake 220 ml  Output --  Net 220 ml   Net IO Since Admission: 8,980.85 mL [05/15/23 1313]  Wt Readings from Last 3 Encounters:  05/15/23 42.9 kg  04/26/23 49.4 kg  02/19/23 48.5 kg     Unresulted Labs (From admission, onward)    None     Data Reviewed: I have personally reviewed following labs and imaging studies CBC: Recent Labs  Lab 05/09/23 1625 05/09/23 2245 05/11/23 0645 05/13/23 0649  WBC 19.4* 24.0* 9.5 7.9  NEUTROABS 18.3*  --   --   --   HGB 10.8* 9.3* 8.3* 8.5*  HCT 33.8* 29.1* 26.6* 28.0*  MCV 84.9 85.1 87.5 87.5  PLT 255 258 221 270   Basic Metabolic Panel:  Recent Labs  Lab 05/11/23 0645 05/12/23 1156 05/13/23 0649 05/14/23 0309 05/15/23 0348  NA 134* 138  136 138 138  K 3.1* 4.0 3.4* 3.7 3.9  CL 99 100 100 103 100  CO2 28 27 26 26 26   GLUCOSE 124* 130* 236* 85 84  BUN 10 22* 23* 19 26*  CREATININE 0.91 0.80 0.94 0.79 1.07*  CALCIUM  8.6* 9.4 9.0 9.4 9.8  MG 1.7 2.2 2.0 1.9  --   PHOS 2.9 2.4* 3.6 3.0  --    GFR: Estimated Creatinine Clearance: 45 mL/min (A) (by C-G formula based on SCr of 1.07 mg/dL (H)). Liver Function Tests:  Recent Labs  Lab 05/09/23 1625 05/12/23 1156 05/14/23 0309  AST 88* 28 32  ALT 111* 54* 45*  ALKPHOS 294* 164* 156*  BILITOT 1.4* 0.8 0.7  PROT 8.5* 7.3 7.7  ALBUMIN  3.5 3.1* 3.3*   No results for input(s): LIPASE, AMYLASE in the last 168 hours. No results for input(s): AMMONIA in the last 168 hours. Coagulation Profile:  Recent Labs  Lab 05/09/23 1625  INR 1.2   Recent Labs   Lab 05/13/23 1804 05/14/23 0801 05/14/23 1534 05/14/23 2138 05/15/23 0351  GLUCAP 116* 78 111* 146* 88   Recent Labs    05/14/23 0309  TRIG 195*   No results for input(s): TSH, T4TOTAL, FREET4, T3FREE, THYROIDAB in the last 72 hours. Sepsis Labs: Recent Labs  Lab 05/09/23 1635 05/12/23 1156  PROCALCITON  --  53.99  LATICACIDVEN 1.0  --    Recent Results (from the past 240 hours)  Blood Culture (routine x 2)     Status: Abnormal   Collection Time: 05/09/23  4:25 PM   Specimen: BLOOD  Result Value Ref Range Status   Specimen Description   Final    BLOOD PORTA CATH Performed at [facility], [address], [place], [place] [address]    Special Requests   Final    BOTTLES DRAWN AEROBIC AND ANAEROBIC Blood Culture results may not be optimal due to an inadequate volume of blood received in culture bottles Performed at [facility], [address] [place], [place] [address]    Culture  Setup Time   Final    GRAM POSITIVE COCCI IN BOTH AEROBIC AND ANAEROBIC BOTTLES CRITICAL RESULT CALLED TO, READ BACK BY AND VERIFIED WITH: C SHADE,[email or web address]@[id number]  05/10/23 MK Performed at [facility], [address] [place], [place] [address]    Culture STAPHYLOCOCCUS EPIDERMIDIS (A)  Final   Report Status 05/12/2023 FINAL  Final   Organism ID, Bacteria STAPHYLOCOCCUS EPIDERMIDIS  Final      Susceptibility   Staphylococcus epidermidis - MIC*    CIPROFLOXACIN  4 RESISTANT Resistant     ERYTHROMYCIN >=8 RESISTANT Resistant     GENTAMICIN  >=16 RESISTANT Resistant     OXACILLIN >=4 RESISTANT Resistant     TETRACYCLINE 2 SENSITIVE Sensitive     VANCOMYCIN  1 SENSITIVE Sensitive     TRIMETH /SULFA  40 SENSITIVE Sensitive     CLINDAMYCIN <=0.25 SENSITIVE Sensitive     RIFAMPIN <=0.5 SENSITIVE Sensitive     Inducible Clindamycin NEGATIVE Sensitive     * STAPHYLOCOCCUS EPIDERMIDIS  Blood Culture ID Panel (Reflexed)     Status: Abnormal    Collection Time: 05/09/23  4:25 PM  Result Value Ref Range Status   Enterococcus faecalis NOT DETECTED NOT DETECTED Final   Enterococcus Faecium NOT DETECTED NOT DETECTED Final   Listeria monocytogenes NOT DETECTED NOT DETECTED Final   Staphylococcus species DETECTED (A) NOT DETECTED Final    Comment: CRITICAL RESULT CALLED TO, READ BACK  BY AND VERIFIED WITH: C SHADE,PHARMD@[id number]  05/10/23 MK    Staphylococcus aureus (BCID) NOT DETECTED NOT DETECTED Final   Staphylococcus epidermidis DETECTED (A) NOT DETECTED Final    Comment: Methicillin (oxacillin) resistant coagulase negative staphylococcus. Possible blood culture contaminant (unless isolated from more than one blood culture draw or clinical case suggests pathogenicity). No antibiotic treatment is indicated for blood  culture contaminants. CRITICAL RESULT CALLED TO, READ BACK BY AND VERIFIED WITH: C SHADE,PHARMD@[id number]  05/10/23 MK    Staphylococcus lugdunensis NOT DETECTED NOT DETECTED Final   Streptococcus species NOT DETECTED NOT DETECTED Final   Streptococcus agalactiae NOT DETECTED NOT DETECTED Final   Streptococcus pneumoniae NOT DETECTED NOT DETECTED Final   Streptococcus pyogenes NOT DETECTED NOT DETECTED Final   A.calcoaceticus-baumannii NOT DETECTED NOT DETECTED Final   Bacteroides fragilis NOT DETECTED NOT DETECTED Final   Enterobacterales NOT DETECTED NOT DETECTED Final   Enterobacter cloacae complex NOT DETECTED NOT DETECTED Final   Escherichia coli NOT DETECTED NOT DETECTED Final   Klebsiella aerogenes NOT DETECTED NOT DETECTED Final   Klebsiella oxytoca NOT DETECTED NOT DETECTED Final   Klebsiella pneumoniae NOT DETECTED NOT DETECTED Final   Proteus species NOT DETECTED NOT DETECTED Final   Salmonella species NOT DETECTED NOT DETECTED Final   Serratia marcescens NOT DETECTED NOT DETECTED Final   Haemophilus influenzae NOT DETECTED NOT DETECTED Final   Neisseria meningitidis NOT DETECTED NOT DETECTED Final   Pseudomonas  aeruginosa NOT DETECTED NOT DETECTED Final   Stenotrophomonas maltophilia NOT DETECTED NOT DETECTED Final   Candida albicans NOT DETECTED NOT DETECTED Final   Candida auris NOT DETECTED NOT DETECTED Final   Candida glabrata NOT DETECTED NOT DETECTED Final   Candida krusei NOT DETECTED NOT DETECTED Final   Candida parapsilosis NOT DETECTED NOT DETECTED Final   Candida tropicalis NOT DETECTED NOT DETECTED Final   Cryptococcus neoformans/gattii NOT DETECTED NOT DETECTED Final   Methicillin resistance mecA/C DETECTED (A) NOT DETECTED Final    Comment: CRITICAL RESULT CALLED TO, READ BACK BY AND VERIFIED WITH: C SHADE,PHARMD@[id number]  05/10/23 MK Performed at [facility], [address], [place], [place] [address]   Blood Culture (routine x 2)     Status: Abnormal   Collection Time: 05/09/23  4:40 PM   Specimen: BLOOD  Result Value Ref Range Status   Specimen Description   Final    BLOOD LEFT ANTECUBITAL Performed at [facility], [address] [place], [place] [address]    Special Requests   Final    BOTTLES DRAWN AEROBIC AND ANAEROBIC Blood Culture results may not be optimal due to an inadequate volume of blood received in culture bottles Performed at [facility], [address], [place], [place] [address]    Culture  Setup Time   Final    GRAM POSITIVE COCCI IN CHAINS ANAEROBIC BOTTLE ONLY CRITICAL RESULT CALLED TO, READ BACK BY AND VERIFIED WITH: PHARMD CHRISTINE SHADE ON 05/10/23 @ 1239 BY DRT Performed at [facility], [address] [place], [place] [address]    Culture STREPTOCOCCUS MITIS/ORALIS (A)  Final   Report Status 05/13/2023 FINAL  Final   Organism ID, Bacteria STREPTOCOCCUS MITIS/ORALIS  Final      Susceptibility   Streptococcus mitis/oralis - MIC*    PENICILLIN  <=0.06 SENSITIVE Sensitive     CEFTRIAXONE  <=0.12 SENSITIVE Sensitive     LEVOFLOXACIN  1 SENSITIVE Sensitive     VANCOMYCIN  0.5 SENSITIVE Sensitive     *  STREPTOCOCCUS MITIS/ORALIS  Fungus culture, blood     Status: Abnormal   Collection Time: 05/09/23  4:40 PM   Specimen: BLOOD  Result Value Ref Range Status   Specimen Description   Final    BLOOD LEFT ANTECUBITAL Performed at [facility], [address] [place], [place] [address]    Special Requests   Final    NONE Performed at [facility], [address] [place], [place] [address]    Fungal Smear   Final    GRAM POSITIVE COCCI IN CLUSTERS AEROBIC BOTTLE ONLY GRAM POSITIVE COCCI IN CHAINS ANAEROBIC BOTTLE ONLY CRITICAL VALUE NOTED.  VALUE IS CONSISTENT WITH PREVIOUSLY REPORTED AND CALLED VALUE. Performed at [facility], [address] [place], [place] [address]    Culture (A)  Final    STREPTOCOCCUS MITIS/ORALIS STAPHYLOCOCCUS EPIDERMIDIS    Report Status 05/15/2023 FINAL  Final   Organism ID, Bacteria STREPTOCOCCUS MITIS/ORALIS  Final   Organism ID, Bacteria STAPHYLOCOCCUS EPIDERMIDIS  Final      Susceptibility   Staphylococcus epidermidis - MIC*    CIPROFLOXACIN  4 RESISTANT Resistant     ERYTHROMYCIN >=8 RESISTANT Resistant     GENTAMICIN  <=0.5 SENSITIVE Sensitive     OXACILLIN <=0.25 SENSITIVE Sensitive     TETRACYCLINE 2 SENSITIVE Sensitive     VANCOMYCIN  1 SENSITIVE Sensitive     TRIMETH /SULFA  40 SENSITIVE Sensitive     CLINDAMYCIN <=0.25 SENSITIVE Sensitive     RIFAMPIN <=0.5 SENSITIVE Sensitive     Inducible Clindamycin NEGATIVE Sensitive     * STAPHYLOCOCCUS EPIDERMIDIS   Streptococcus mitis/oralis - MIC*    PENICILLIN  0.12 SENSITIVE Sensitive     CEFTRIAXONE  <=0.12 SENSITIVE Sensitive     LEVOFLOXACIN  1 SENSITIVE Sensitive     VANCOMYCIN  0.5 SENSITIVE Sensitive     * STREPTOCOCCUS MITIS/ORALIS  Blood Culture ID Panel (Reflexed)     Status: Abnormal   Collection Time: 05/09/23  4:40 PM  Result Value Ref Range Status   Enterococcus faecalis NOT DETECTED NOT DETECTED Final   Enterococcus Faecium NOT DETECTED NOT DETECTED  Final   Listeria monocytogenes NOT DETECTED NOT DETECTED Final   Staphylococcus species NOT DETECTED NOT DETECTED Final   Staphylococcus aureus (BCID) NOT DETECTED NOT DETECTED Final   Staphylococcus epidermidis NOT DETECTED NOT DETECTED Final   Staphylococcus lugdunensis NOT DETECTED NOT DETECTED Final   Streptococcus species DETECTED (A) NOT DETECTED Final    Comment: Not Enterococcus species, Streptococcus agalactiae, Streptococcus pyogenes, or Streptococcus pneumoniae. CRITICAL RESULT CALLED TO, READ BACK BY AND VERIFIED WITH: PHARMD CHRISTINE SHADE ON 05/10/23 @ 1239 BY DRT    Streptococcus agalactiae NOT DETECTED NOT DETECTED Final   Streptococcus pneumoniae NOT DETECTED NOT DETECTED Final   Streptococcus pyogenes NOT DETECTED NOT DETECTED Final   A.calcoaceticus-baumannii NOT DETECTED NOT DETECTED Final   Bacteroides fragilis NOT DETECTED NOT DETECTED Final   Enterobacterales NOT DETECTED NOT DETECTED Final   Enterobacter cloacae complex NOT DETECTED NOT DETECTED Final   Escherichia coli NOT DETECTED NOT DETECTED Final   Klebsiella aerogenes NOT DETECTED NOT DETECTED Final   Klebsiella oxytoca NOT DETECTED NOT DETECTED Final   Klebsiella pneumoniae NOT DETECTED NOT DETECTED Final   Proteus species NOT DETECTED NOT DETECTED Final   Salmonella species NOT DETECTED NOT DETECTED Final   Serratia marcescens NOT DETECTED NOT DETECTED Final   Haemophilus influenzae NOT DETECTED NOT DETECTED Final   Neisseria meningitidis NOT DETECTED NOT DETECTED Final   Pseudomonas aeruginosa NOT DETECTED NOT DETECTED Final  Stenotrophomonas maltophilia NOT DETECTED NOT DETECTED Final   Candida albicans NOT DETECTED NOT DETECTED Final   Candida auris NOT DETECTED NOT DETECTED Final   Candida glabrata NOT DETECTED NOT DETECTED Final   Candida krusei NOT DETECTED NOT DETECTED Final   Candida parapsilosis NOT DETECTED NOT DETECTED Final   Candida tropicalis NOT DETECTED NOT DETECTED Final    Cryptococcus neoformans/gattii NOT DETECTED NOT DETECTED Final    Comment: Performed at [facility], [address], [place], [place] [address]  Resp panel by RT-PCR (RSV, Flu A&B, Covid) Anterior Nasal Swab     Status: Abnormal   Collection Time: 05/09/23 10:45 PM   Specimen: Anterior Nasal Swab  Result Value Ref Range Status   SARS Coronavirus 2 by RT PCR NEGATIVE NEGATIVE Final    Comment: (NOTE) SARS-CoV-2 target nucleic acids are NOT DETECTED.  The SARS-CoV-2 RNA is generally detectable in upper respiratory specimens during the acute phase of infection. The lowest concentration of SARS-CoV-2 viral copies this assay can detect is 138 copies/mL. A negative result does not preclude SARS-Cov-2 infection and should not be used as the sole basis for treatment or other patient management decisions. A negative result may occur with  improper specimen collection/handling, submission of specimen other than nasopharyngeal swab, presence of viral mutation(s) within the areas targeted by this assay, and inadequate number of viral copies(<138 copies/mL). A negative result must be combined with clinical observations, patient history, and epidemiological information. The expected result is Negative.  Fact Sheet for Patients:  bloggercourse.com  Fact Sheet for Healthcare Providers:  seriousbroker.it  This test is no t yet approved or cleared by the [place]  FDA and  has been authorized for detection and/or diagnosis of SARS-CoV-2 by FDA under an Emergency Use Authorization (EUA). This EUA will remain  in effect (meaning this test can be used) for the duration of the COVID-19 declaration under Section 564(b)(1) of the Act, 21 U.S.C.section 360bbb-3(b)(1), unless the authorization is terminated  or revoked sooner.       Influenza A by PCR POSITIVE (A) NEGATIVE Final   Influenza B by PCR NEGATIVE NEGATIVE Final    Comment:  (NOTE) The Xpert Xpress SARS-CoV-2/FLU/RSV plus assay is intended as an aid in the diagnosis of influenza from Nasopharyngeal swab specimens and should not be used as a sole basis for treatment. Nasal washings and aspirates are unacceptable for Xpert Xpress SARS-CoV-2/FLU/RSV testing.  Fact Sheet for Patients: bloggercourse.com  Fact Sheet for Healthcare Providers: seriousbroker.it  This test is not yet approved or cleared by the [place]  FDA and has been authorized for detection and/or diagnosis of SARS-CoV-2 by FDA under an Emergency Use Authorization (EUA). This EUA will remain in effect (meaning this test can be used) for the duration of the COVID-19 declaration under Section 564(b)(1) of the Act, 21 U.S.C. section 360bbb-3(b)(1), unless the authorization is terminated or revoked.     Resp Syncytial Virus by PCR NEGATIVE NEGATIVE Final    Comment: (NOTE) Fact Sheet for Patients: bloggercourse.com  Fact Sheet for Healthcare Providers: seriousbroker.it  This test is not yet approved or cleared by the [place]  FDA and has been authorized for detection and/or diagnosis of SARS-CoV-2 by FDA under an Emergency Use Authorization (EUA). This EUA will remain in effect (meaning this test can be used) for the duration of the COVID-19 declaration under Section 564(b)(1) of the Act, 21 U.S.C. section 360bbb-3(b)(1), unless the authorization is terminated or revoked.  Performed at [facility],  [address] [place] [address] [place], [place] [address]   Gastrointestinal Panel by PCR , Stool     Status: None   Collection Time: 05/09/23 10:45 PM   Specimen: Anterior Nasal Swab; Stool  Result Value Ref Range Status   Campylobacter species NOT DETECTED NOT DETECTED Final   Plesimonas shigelloides NOT DETECTED NOT DETECTED Final   Salmonella species NOT DETECTED NOT DETECTED  Final   Yersinia enterocolitica NOT DETECTED NOT DETECTED Final   Vibrio species NOT DETECTED NOT DETECTED Final   Vibrio cholerae NOT DETECTED NOT DETECTED Final   Enteroaggregative E coli (EAEC) NOT DETECTED NOT DETECTED Final   Enteropathogenic E coli (EPEC) NOT DETECTED NOT DETECTED Final   Enterotoxigenic E coli (ETEC) NOT DETECTED NOT DETECTED Final   Shiga like toxin producing E coli (STEC) NOT DETECTED NOT DETECTED Final   Shigella/Enteroinvasive E coli (EIEC) NOT DETECTED NOT DETECTED Final   Cryptosporidium NOT DETECTED NOT DETECTED Final   Cyclospora cayetanensis NOT DETECTED NOT DETECTED Final   Entamoeba histolytica NOT DETECTED NOT DETECTED Final   Giardia lamblia NOT DETECTED NOT DETECTED Final   Adenovirus F40/41 NOT DETECTED NOT DETECTED Final   Astrovirus NOT DETECTED NOT DETECTED Final   Norovirus GI/GII NOT DETECTED NOT DETECTED Final   Rotavirus A NOT DETECTED NOT DETECTED Final   Sapovirus (I, II, IV, and V) NOT DETECTED NOT DETECTED Final    Comment: Performed at [facility], [address] [place], [place] [address]  MRSA Next Gen by PCR, Nasal     Status: Abnormal   Collection Time: 05/10/23  1:09 AM   Specimen: Nasal Mucosa; Nasal Swab  Result Value Ref Range Status   MRSA by PCR Next Gen DETECTED (A) NOT DETECTED Final    Comment: (NOTE) The GeneXpert MRSA Assay (FDA approved for NASAL specimens only), is one component of a comprehensive MRSA colonization surveillance program. It is not intended to diagnose MRSA infection nor to guide or monitor treatment for MRSA infections. Test performance is not FDA approved in patients less than 29 years old. Performed at [facility], [address] [place], [place] [address]   Respiratory (~20 pathogens) panel by PCR     Status: Abnormal   Collection Time: 05/10/23 12:46 PM   Specimen: Nasopharyngeal Swab; Respiratory  Result Value Ref Range Status   Adenovirus NOT DETECTED NOT  DETECTED Final   Coronavirus 229E NOT DETECTED NOT DETECTED Final    Comment: (NOTE) The Coronavirus on the Respiratory Panel, DOES NOT test for the novel  Coronavirus (2019 nCoV)    Coronavirus HKU1 NOT DETECTED NOT DETECTED Final   Coronavirus NL63 NOT DETECTED NOT DETECTED Final   Coronavirus OC43 NOT DETECTED NOT DETECTED Final   Metapneumovirus NOT DETECTED NOT DETECTED Final   Rhinovirus / Enterovirus NOT DETECTED NOT DETECTED Final   Influenza A H1 2009 DETECTED (A) NOT DETECTED Final   Influenza B NOT DETECTED NOT DETECTED Final   Parainfluenza Virus 1 NOT DETECTED NOT DETECTED Final   Parainfluenza Virus 2 NOT DETECTED NOT DETECTED Final   Parainfluenza Virus 3 NOT DETECTED NOT DETECTED Final   Parainfluenza Virus 4 NOT DETECTED NOT DETECTED Final   Respiratory Syncytial Virus NOT DETECTED NOT DETECTED Final   Bordetella pertussis NOT DETECTED NOT DETECTED Final   Bordetella Parapertussis NOT DETECTED NOT DETECTED Final   Chlamydophila pneumoniae NOT DETECTED NOT DETECTED Final   Mycoplasma pneumoniae NOT DETECTED NOT DETECTED Final    Comment: Performed at [facility]  Lab, [address], [place], [place] [address]    Antimicrobials/Microbiology: Anti-infectives (From admission, onward)    Start     Dose/Rate Route Frequency Ordered Stop   05/12/23 2200  oseltamivir  (TAMIFLU ) capsule 75 mg       Placed in Followed by Linked Group   75 mg Oral 2 times daily 05/12/23 1537 05/14/23 2103   05/10/23 2300  fluconazole  (DIFLUCAN ) IVPB 200 mg  Status:  Discontinued       Placed in Followed by Linked Group   200 mg 100 mL/hr over 60 Minutes Intravenous Every 24 hours 05/09/23 2249 05/10/23 1443   05/10/23 2200  oseltamivir  (TAMIFLU ) capsule 30 mg  Status:  Discontinued       Placed in Followed by Linked Group   30 mg Oral 2 times daily 05/10/23 0844 05/12/23 1537   05/10/23 1530  DAPTOmycin  (CUBICIN ) IVPB 500 mg/50mL premix  Status:  Discontinued        500 mg 100  mL/hr over 30 Minutes Intravenous Daily 05/10/23 1443 05/15/23 0821   05/10/23 1000  ceFEPIme  (MAXIPIME ) 2 g in sodium chloride  0.9 % 100 mL IVPB  Status:  Discontinued        2 g 200 mL/hr over 30 Minutes Intravenous Every 12 hours 05/10/23 0503 05/10/23 1443   05/10/23 1000  oseltamivir  (TAMIFLU ) capsule 75 mg  Status:  Discontinued        75 mg Oral 2 times daily 05/10/23 0838 05/10/23 0845   05/10/23 0930  oseltamivir  (TAMIFLU ) capsule 75 mg       Placed in Followed by Linked Group   75 mg Oral  Once 05/10/23 0844 05/10/23 0938   05/10/23 0900  linezolid  (ZYVOX ) IVPB 600 mg  Status:  Discontinued        600 mg 300 mL/hr over 60 Minutes Intravenous Every 12 hours 05/10/23 0758 05/10/23 1443   05/10/23 0600  metroNIDAZOLE  (FLAGYL ) IVPB 500 mg  Status:  Discontinued        500 mg 100 mL/hr over 60 Minutes Intravenous Every 12 hours 05/10/23 0456 05/10/23 1443   05/09/23 2300  piperacillin -tazobactam (ZOSYN ) IVPB 3.375 g  Status:  Discontinued        3.375 g 12.5 mL/hr over 240 Minutes Intravenous Every 8 hours 05/09/23 2244 05/10/23 0456   05/09/23 2300  fluconazole  (DIFLUCAN ) IVPB 400 mg       Placed in Followed by Linked Group   400 mg 100 mL/hr over 120 Minutes Intravenous  Once 05/09/23 2249 05/10/23 0855   05/09/23 2215  ceFEPIme  (MAXIPIME ) 2 g in sodium chloride  0.9 % 100 mL IVPB  Status:  Discontinued        2 g 200 mL/hr over 30 Minutes Intravenous  Once 05/09/23 2214 05/09/23 2237   05/09/23 1715  vancomycin  (VANCOCIN ) IVPB 1000 mg/200 mL premix        1,000 mg 200 mL/hr over 60 Minutes Intravenous  Once 05/09/23 1706 05/09/23 2338   05/09/23 1715  ceFEPIme  (MAXIPIME ) 2 g in sodium chloride  0.9 % 100 mL IVPB  Status:  Discontinued        2 g 200 mL/hr over 30 Minutes Intravenous  Once 05/09/23 1706 05/09/23 2214         Component Value Date/Time   SDES  05/09/2023 1640    BLOOD LEFT ANTECUBITAL Performed at [facility], [address],  [place], [place] [address]    SDES  05/09/2023 1640    BLOOD LEFT ANTECUBITAL  Performed at [facility], [address], [place], [place] [address]    SPECREQUEST  05/09/2023 1640    BOTTLES DRAWN AEROBIC AND ANAEROBIC Blood Culture results may not be optimal due to an inadequate volume of blood received in culture bottles Performed at [facility], [address] [place], [place] [address]    SPECREQUEST  05/09/2023 1640    NONE Performed at [facility], [address] [place], [place] [address]    CULT STREPTOCOCCUS MITIS/ORALIS (A) 05/09/2023 1640   CULT (A) 05/09/2023 1640    STREPTOCOCCUS MITIS/ORALIS STAPHYLOCOCCUS EPIDERMIDIS    REPTSTATUS 05/13/2023 FINAL 05/09/2023 1640   REPTSTATUS 05/15/2023 FINAL 05/09/2023 1640     Radiology Studies: No results found.   LOS: 6 days   Total time spent in review of labs and imaging, patient evaluation, formulation of plan, documentation and communication with family: 35 minutes  Mennie LAMY, MD  [facility]  05/15/2023, 1:13 PM

## 2023-05-15 NOTE — Progress Notes (Signed)
 ID Pharmacist Note   Spoke with Dr. 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 Will D/C Daptomycin .    Damien Quiet, PharmD, BCPS, BCIDP Infectious Diseases Clinical Pharmacist Phone: [phone] 05/15/2023 8:24 AM

## 2023-05-15 NOTE — Discharge Summary (Deleted)
 Physician Discharge Summary  [name] FMW:[id number] DOB: December 04, 1975 DOA: 05/09/2023  PCP: [name], MD  Admit date: 05/09/2023 Discharge date: 05/17/2023 Recommendations for Outpatient Follow-up:  Follow up with PCP in 1 weeks-call for appointment Please obtain BMP/CBC in one week  Discharge Dispo: Home with home health Discharge Condition: Stable Code Status:   Code Status: Full Code Diet recommendation:  Diet Order             Diet - low sodium heart healthy           Diet regular Room service appropriate? 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 No obstruction Left-sided ileostomy post colectomy. Small amount of free fluid in the right pelvis is well as adnexa, unchanged from prior exam. Chronic uterine fibroids and  found to be hypotensive unresponsive to IV fluids and albumin  was started on vasopressors now off of it, off since 05/12/2023, tachycardic improved after IV fluids.  She received a dose of hydrocortisone  and midodrine  after she was taken off the pressors for ongoing soft blood pressure. She is being treated with Tamiflu  and daptomycin -and ID has been following. She has a chronic right lower quadrant cutaneous drain which was exchanged on #27 2024 status post ostomy.  Patient today for influenza A Patient was transferred to [facility] 05/13/2023.  ID was following continued on IV antibiotics due to bacteremia-growing in staph epidermidis from right antecubital fossa  and staph also from the port. Initially not clear if it was back to bacteremia versus contamination but subsequently staph epidermidis has 2 different sensitivity patterns and per Dr. Fleeta Rothman this is consistent with contamination antibiotic discontinued She had bump in creatinine so kept additional 1 night with TPN IV fluid and creatinine has improved.  She is very anxious to get discharged for several days now.  At this time stable for discharge , TOC working on to resume home TPN  Discharge Diagnoses:  Principal Problem:   Septic shock (HCC) Active Problems:   Hypotension   On total parenteral nutrition (TPN)   Influenza A   Positive blood culture   Crohn's disease (HCC)   H/O abdominal abscess   Weight loss   Protein-calorie malnutrition, moderate (HCC)   Influenza A  Doing well currently on room air, completed Tamiflu  course  History of lower lobe subsegmental PE in September 2024: Stable continue home Eliquis   Hypotension-chronic since ileostomy Tachycardia ?Relative adrenal insufficiency: Patient did not respond to fluids and needed pressors while in ICU-hypotension likely multifactorial-she has short gut syndrome, influenza A infection.  Chronically on TPN.  Off pressors transition to midodrine  and Solu-Cortef  and transferred out of ICU.she is not on steroids chronically at [facility] hydrocortisone .  BP is maintained well, although soft blood pressure this morning orthostatic vital negative blood pressure holding been 99-100 tens systolic and asymptomatic patient reports she is up to 80s systolic at home asymptomatic.  Home metoprolol  discontinued  ?Bacteremia: Felt to be contamination ?Port-A-Cath infection with Streptococcus submentalis and epidermidis, ID is following  likely consistent with contamination of the peripheral stick and colonization of the port.  Managed with empiric daptomycin  patient has had intra-abdominal abscesses in 2024 however current CT does not show any  evidence of abscesses. Staph epidermidis has 2 different sensitivity patterns and per Dr. Fleeta Rothman this is consistent with contamination antibiotic discontinued and doing well   AKI: Ceatinine trended up overnight- gave 1 liter BOLUS, Kept on TPN and IVF and creatinine improved.    Moderate malnutrition in the setting of chronic illness Short gut syndrome  On TPN chronically : Patient is back on TPN TPN order has been resumed    Anemia of chronic disease: HB holding 8.5 g range, monitor   Consults: Infectious disease Wound care Subjective: She is alert awake, she states she is ready to go home she   Discharge Exam: Vitals:   05/17/23 0619 05/17/23 0907  BP: 91/65 110/77  Pulse: 75 96  Resp:    Temp:    SpO2:  100%   General: Pt is alert, awake, not in acute distress Cardiovascular: RRR, S1/S2 +, no rubs, no gallops Respiratory: CTA bilaterally, no wheezing, no rhonchi Abdominal: Soft, NT, ND, bowel sounds + Extremities: no edema, no cyanosis  Discharge Instructions  Discharge Instructions     Diet - low sodium heart healthy   Complete by: As directed    Discharge instructions   Complete by: As directed    Please call call MD or return to ER for similar or worsening recurring problem that brought you to hospital or if any fever,nausea/vomiting,abdominal pain, uncontrolled pain, chest pain,  shortness of breath or any other alarming symptoms.  Please follow-up your doctor as instructed in a week time and call the office for appointment.  Please avoid alcohol, smoking, or any other illicit substance and maintain healthy habits including taking your regular medications as prescribed.  You were cared for by a hospitalist during your hospital stay. If you have any questions about your discharge medications or the care you received while you were in the hospital after you are discharged, you can call the unit and ask to speak with the hospitalist on call if the hospitalist  that took care of you is not available.  Once you are discharged, your primary care physician will handle any further medical issues. Please note that NO REFILLS for any discharge medications will be authorized once you are discharged, as it is imperative that you return to your primary care physician (or establish a relationship with a primary care physician if you do not have one) for your aftercare needs so that they can reassess your need for medications and monitor your lab values   Increase activity slowly   Complete by: As directed    No wound care   Complete by: As directed       Allergies as of 05/17/2023       Reactions   Nsaids Other (See Comments)   Crohns disease/ IBD   Vancomycin  Itching   Chlorhexidine  Gluconate Itching   Able to use CHG swabs for ports, reaction with direct skin contact   Hydromorphone  Other (See Comments)   Confusion/sedation   Lactose Other (See Comments)   Upset stomach/diarrhea   Lactose Intolerance (gi) Other (See Comments)   Upset stomach/diarrhea        Medication List     STOP taking these medications    fiber Pack packet   gabapentin  250 MG/5ML solution Commonly known as: NEURONTIN    insulin  aspart  100 UNIT/ML injection Commonly known as: novoLOG    metoprolol  tartrate 25 MG tablet Commonly known as: LOPRESSOR    Opium  10 MG/ML (1%) Tinc       TAKE these medications    amoxicillin -clavulanate 875-125 MG tablet Commonly known as: AUGMENTIN  Take 1 tablet by mouth 2 (two) times daily.   apixaban  5 MG Tabs tablet Commonly known as: ELIQUIS  Take 5 mg by mouth 2 (two) times daily. What changed: Another medication with the same name was removed. Continue taking this medication, and follow the directions you see here.   diphenoxylate -atropine  2.5-0.025 MG tablet Commonly known as: LOMOTIL  Take 2 tablets by mouth 4 (four) times daily.   midodrine  10 MG tablet Commonly known as: PROAMATINE  Take 1 tablet (10 mg total) by  mouth 3 (three) times daily with meals.   sterile water SOLN with amino acids  10 % SOLN 1.3 g/kg, dextrose  70 % SOLN 20 % Inject into the vein continuous. Infuse TPN 3/1 1500 ml IV through PICC Line Via ambulatory infusion pump over 16 Hour with 1 Hour Ramp Up & 1 Hour Ramp Down 3 X Week (MWF). Add 10 ml MVI Daily Prior To Infusion. Bag Volume: 1550 ml  Base Formula           Amount/Day         Amount/L  Dextrose  70%          175.00 Gm           116.67 Gm Plenamine 15%        80.00 Gm             53.33 Gm SMOFlipid 20%        40.00 Gm             26.67 Gm  Electrolytes  Calcium  Glucona     6.00 meq              4.00 meq Magnesium  Sulf      8.00 meq               5.33 meq Sodium Chloride      230 meq                153.33 meq Potassium Phos     15 mM                    10.00 mM K Acetate                67.00 meq              44.67 meq  Vitamins, trace elements and medications Tralement               1.00 mL                   0.67 mL   Zinc  220 (50 Zn) MG Caps Take 1 capsule by mouth daily.        Follow-up Information     [facility] Follow up.   Contact information: [facility] TPN) [address] [place], [place] [address] [phone]        [name], MD Follow up in 1 week(s).   Specialty: Internal Medicine Contact information: [address] [place] [address] [phone]                Allergies  Allergen Reactions   Nsaids Other (See Comments)    Crohns disease/ IBD   Vancomycin  Itching  Chlorhexidine  Gluconate Itching    Able to use CHG swabs for ports, reaction with direct skin contact   Hydromorphone  Other (See Comments)    Confusion/sedation   Lactose Other (See Comments)    Upset stomach/diarrhea   Lactose Intolerance (Gi) Other (See Comments)    Upset stomach/diarrhea    The results of significant diagnostics from this hospitalization (including imaging, microbiology, ancillary and laboratory) are listed below for  reference.    Microbiology: Recent Results (from the past 240 hours)  Blood Culture (routine x 2)     Status: Abnormal   Collection Time: 05/09/23  4:25 PM   Specimen: BLOOD  Result Value Ref Range Status   Specimen Description   Final    BLOOD PORTA CATH Performed at [facility], [address], [place], [place] [address]    Special Requests   Final    BOTTLES DRAWN AEROBIC AND ANAEROBIC Blood Culture results may not be optimal due to an inadequate volume of blood received in culture bottles Performed at [facility], [address] [place], [place] [address]    Culture  Setup Time   Final    GRAM POSITIVE COCCI IN BOTH AEROBIC AND ANAEROBIC BOTTLES CRITICAL RESULT CALLED TO, READ BACK BY AND VERIFIED WITH: C SHADE,PHARMD@[id number]  05/10/23 MK Performed at [facility], [address], [place], [place] [address]    Culture STAPHYLOCOCCUS EPIDERMIDIS (A)  Final   Report Status 05/12/2023 FINAL  Final   Organism ID, Bacteria STAPHYLOCOCCUS EPIDERMIDIS  Final      Susceptibility   Staphylococcus epidermidis - MIC*    CIPROFLOXACIN  4 RESISTANT Resistant     ERYTHROMYCIN >=8 RESISTANT Resistant     GENTAMICIN  >=16 RESISTANT Resistant     OXACILLIN >=4 RESISTANT Resistant     TETRACYCLINE 2 SENSITIVE Sensitive     VANCOMYCIN  1 SENSITIVE Sensitive     TRIMETH /SULFA  40 SENSITIVE Sensitive     CLINDAMYCIN <=0.25 SENSITIVE Sensitive     RIFAMPIN <=0.5 SENSITIVE Sensitive     Inducible Clindamycin NEGATIVE Sensitive     * STAPHYLOCOCCUS EPIDERMIDIS  Blood Culture ID Panel (Reflexed)     Status: Abnormal   Collection Time: 05/09/23  4:25 PM  Result Value Ref Range Status   Enterococcus faecalis NOT DETECTED NOT DETECTED Final   Enterococcus Faecium NOT DETECTED NOT DETECTED Final   Listeria monocytogenes NOT DETECTED NOT DETECTED Final   Staphylococcus species DETECTED (A) NOT DETECTED Final    Comment: CRITICAL RESULT CALLED TO, READ BACK BY  AND VERIFIED WITH: C SHADE,PHARMD@[id number]  05/10/23 MK    Staphylococcus aureus (BCID) NOT DETECTED NOT DETECTED Final   Staphylococcus epidermidis DETECTED (A) NOT DETECTED Final    Comment: Methicillin (oxacillin) resistant coagulase negative staphylococcus. Possible blood culture contaminant (unless isolated from more than one blood culture draw or clinical case suggests pathogenicity). No antibiotic treatment is indicated for blood  culture contaminants. CRITICAL RESULT CALLED TO, READ BACK BY AND VERIFIED WITH: C SHADE,PHARMD@0718  05/10/23 MK    Staphylococcus lugdunensis NOT DETECTED NOT DETECTED Final   Streptococcus species NOT DETECTED NOT DETECTED Final   Streptococcus agalactiae NOT DETECTED NOT DETECTED Final   Streptococcus pneumoniae NOT DETECTED NOT DETECTED Final   Streptococcus pyogenes NOT DETECTED NOT DETECTED Final   A.calcoaceticus-baumannii NOT DETECTED NOT DETECTED Final   Bacteroides fragilis NOT DETECTED NOT DETECTED Final   Enterobacterales NOT DETECTED NOT DETECTED Final   Enterobacter cloacae complex NOT DETECTED NOT DETECTED Final   Escherichia coli  NOT DETECTED NOT DETECTED Final   Klebsiella aerogenes NOT DETECTED NOT DETECTED Final   Klebsiella oxytoca NOT DETECTED NOT DETECTED Final   Klebsiella pneumoniae NOT DETECTED NOT DETECTED Final   Proteus species NOT DETECTED NOT DETECTED Final   Salmonella species NOT DETECTED NOT DETECTED Final   Serratia marcescens NOT DETECTED NOT DETECTED Final   Haemophilus influenzae NOT DETECTED NOT DETECTED Final   Neisseria meningitidis NOT DETECTED NOT DETECTED Final   Pseudomonas aeruginosa NOT DETECTED NOT DETECTED Final   Stenotrophomonas maltophilia NOT DETECTED NOT DETECTED Final   Candida albicans NOT DETECTED NOT DETECTED Final   Candida auris NOT DETECTED NOT DETECTED Final   Candida glabrata NOT DETECTED NOT DETECTED Final   Candida krusei NOT DETECTED NOT DETECTED Final   Candida parapsilosis NOT DETECTED  NOT DETECTED Final   Candida tropicalis NOT DETECTED NOT DETECTED Final   Cryptococcus neoformans/gattii NOT DETECTED NOT DETECTED Final   Methicillin resistance mecA/C DETECTED (A) NOT DETECTED Final    Comment: CRITICAL RESULT CALLED TO, READ BACK BY AND VERIFIED WITH: C SHADE,PHARMD@0718  05/10/23 MK Performed at [facility], [address], [place], [place] [address]   Blood Culture (routine x 2)     Status: Abnormal   Collection Time: 05/09/23  4:40 PM   Specimen: BLOOD  Result Value Ref Range Status   Specimen Description   Final    BLOOD LEFT ANTECUBITAL Performed at [facility], [address] [place], [place] [address]    Special Requests   Final    BOTTLES DRAWN AEROBIC AND ANAEROBIC Blood Culture results may not be optimal due to an inadequate volume of blood received in culture bottles Performed at [facility], [address], [place], [place] [address]    Culture  Setup Time   Final    GRAM POSITIVE COCCI IN CHAINS ANAEROBIC BOTTLE ONLY CRITICAL RESULT CALLED TO, READ BACK BY AND VERIFIED WITH: PHARMD CHRISTINE SHADE ON 05/10/23 @ 1239 BY DRT Performed at [facility], [address], [place], [place] [address]    Culture STREPTOCOCCUS MITIS/ORALIS (A)  Final   Report Status 05/13/2023 FINAL  Final   Organism ID, Bacteria STREPTOCOCCUS MITIS/ORALIS  Final      Susceptibility   Streptococcus mitis/oralis - MIC*    PENICILLIN  <=0.06 SENSITIVE Sensitive     CEFTRIAXONE  <=0.12 SENSITIVE Sensitive     LEVOFLOXACIN  1 SENSITIVE Sensitive     VANCOMYCIN  0.5 SENSITIVE Sensitive     * STREPTOCOCCUS MITIS/ORALIS  Fungus culture, blood     Status: Abnormal   Collection Time: 05/09/23  4:40 PM   Specimen: BLOOD  Result Value Ref Range Status   Specimen Description   Final    BLOOD LEFT ANTECUBITAL Performed at [facility], [address] [place], [place] [address]    Special Requests   Final    NONE Performed at  [facility], [address] [place], [place] [address]    Fungal Smear   Final    GRAM POSITIVE COCCI IN CLUSTERS AEROBIC BOTTLE ONLY GRAM POSITIVE COCCI IN CHAINS ANAEROBIC BOTTLE ONLY CRITICAL VALUE NOTED.  VALUE IS CONSISTENT WITH PREVIOUSLY REPORTED AND CALLED VALUE. Performed at [facility], [address], [place], [place] [address]    Culture (A)  Final    STREPTOCOCCUS MITIS/ORALIS STAPHYLOCOCCUS EPIDERMIDIS    Report Status 05/15/2023 FINAL  Final   Organism ID, Bacteria STREPTOCOCCUS MITIS/ORALIS  Final  Organism ID, Bacteria STAPHYLOCOCCUS EPIDERMIDIS  Final      Susceptibility   Staphylococcus epidermidis - MIC*    CIPROFLOXACIN  4 RESISTANT Resistant     ERYTHROMYCIN >=8 RESISTANT Resistant     GENTAMICIN  <=0.5 SENSITIVE Sensitive     OXACILLIN <=0.25 SENSITIVE Sensitive     TETRACYCLINE 2 SENSITIVE Sensitive     VANCOMYCIN  1 SENSITIVE Sensitive     TRIMETH /SULFA  40 SENSITIVE Sensitive     CLINDAMYCIN <=0.25 SENSITIVE Sensitive     RIFAMPIN <=0.5 SENSITIVE Sensitive     Inducible Clindamycin NEGATIVE Sensitive     * STAPHYLOCOCCUS EPIDERMIDIS   Streptococcus mitis/oralis - MIC*    PENICILLIN  0.12 SENSITIVE Sensitive     CEFTRIAXONE  <=0.12 SENSITIVE Sensitive     LEVOFLOXACIN  1 SENSITIVE Sensitive     VANCOMYCIN  0.5 SENSITIVE Sensitive     * STREPTOCOCCUS MITIS/ORALIS  Blood Culture ID Panel (Reflexed)     Status: Abnormal   Collection Time: 05/09/23  4:40 PM  Result Value Ref Range Status   Enterococcus faecalis NOT DETECTED NOT DETECTED Final   Enterococcus Faecium NOT DETECTED NOT DETECTED Final   Listeria monocytogenes NOT DETECTED NOT DETECTED Final   Staphylococcus species NOT DETECTED NOT DETECTED Final   Staphylococcus aureus (BCID) NOT DETECTED NOT DETECTED Final   Staphylococcus epidermidis NOT DETECTED NOT DETECTED Final   Staphylococcus lugdunensis NOT DETECTED NOT DETECTED Final   Streptococcus species DETECTED (A) NOT  DETECTED Final    Comment: Not Enterococcus species, Streptococcus agalactiae, Streptococcus pyogenes, or Streptococcus pneumoniae. CRITICAL RESULT CALLED TO, READ BACK BY AND VERIFIED WITH: PHARMD CHRISTINE SHADE ON 05/10/23 @ 1239 BY DRT    Streptococcus agalactiae NOT DETECTED NOT DETECTED Final   Streptococcus pneumoniae NOT DETECTED NOT DETECTED Final   Streptococcus pyogenes NOT DETECTED NOT DETECTED Final   A.calcoaceticus-baumannii NOT DETECTED NOT DETECTED Final   Bacteroides fragilis NOT DETECTED NOT DETECTED Final   Enterobacterales NOT DETECTED NOT DETECTED Final   Enterobacter cloacae complex NOT DETECTED NOT DETECTED Final   Escherichia coli NOT DETECTED NOT DETECTED Final   Klebsiella aerogenes NOT DETECTED NOT DETECTED Final   Klebsiella oxytoca NOT DETECTED NOT DETECTED Final   Klebsiella pneumoniae NOT DETECTED NOT DETECTED Final   Proteus species NOT DETECTED NOT DETECTED Final   Salmonella species NOT DETECTED NOT DETECTED Final   Serratia marcescens NOT DETECTED NOT DETECTED Final   Haemophilus influenzae NOT DETECTED NOT DETECTED Final   Neisseria meningitidis NOT DETECTED NOT DETECTED Final   Pseudomonas aeruginosa NOT DETECTED NOT DETECTED Final   Stenotrophomonas maltophilia NOT DETECTED NOT DETECTED Final   Candida albicans NOT DETECTED NOT DETECTED Final   Candida auris NOT DETECTED NOT DETECTED Final   Candida glabrata NOT DETECTED NOT DETECTED Final   Candida krusei NOT DETECTED NOT DETECTED Final   Candida parapsilosis NOT DETECTED NOT DETECTED Final   Candida tropicalis NOT DETECTED NOT DETECTED Final   Cryptococcus neoformans/gattii NOT DETECTED NOT DETECTED Final    Comment: Performed at [facility], [address], [place], [place] [address]  Resp panel by RT-PCR (RSV, Flu A&B, Covid) Anterior Nasal Swab     Status: Abnormal   Collection Time: 05/09/23 10:45 PM   Specimen: Anterior Nasal Swab  Result Value Ref Range Status   SARS Coronavirus  2 by RT PCR NEGATIVE NEGATIVE Final    Comment: (NOTE) SARS-CoV-2 target nucleic acids are NOT DETECTED.  The SARS-CoV-2 RNA is generally detectable in upper respiratory specimens during the acute phase  of infection. The lowest concentration of SARS-CoV-2 viral copies this assay can detect is 138 copies/mL. A negative result does not preclude SARS-Cov-2 infection and should not be used as the sole basis for treatment or other patient management decisions. A negative result may occur with  improper specimen collection/handling, submission of specimen other than nasopharyngeal swab, presence of viral mutation(s) within the areas targeted by this assay, and inadequate number of viral copies(<138 copies/mL). A negative result must be combined with clinical observations, patient history, and epidemiological information. The expected result is Negative.  Fact Sheet for Patients:  bloggercourse.com  Fact Sheet for Healthcare Providers:  seriousbroker.it  This test is no t yet approved or cleared by the [place]  FDA and  has been authorized for detection and/or diagnosis of SARS-CoV-2 by FDA under an Emergency Use Authorization (EUA). This EUA will remain  in effect (meaning this test can be used) for the duration of the COVID-19 declaration under Section 564(b)(1) of the Act, 21 U.S.C.section 360bbb-3(b)(1), unless the authorization is terminated  or revoked sooner.       Influenza A by PCR POSITIVE (A) NEGATIVE Final   Influenza B by PCR NEGATIVE NEGATIVE Final    Comment: (NOTE) The Xpert Xpress SARS-CoV-2/FLU/RSV plus assay is intended as an aid in the diagnosis of influenza from Nasopharyngeal swab specimens and should not be used as a sole basis for treatment. Nasal washings and aspirates are unacceptable for Xpert Xpress SARS-CoV-2/FLU/RSV testing.  Fact Sheet for  Patients: bloggercourse.com  Fact Sheet for Healthcare Providers: seriousbroker.it  This test is not yet approved or cleared by the [place]  FDA and has been authorized for detection and/or diagnosis of SARS-CoV-2 by FDA under an Emergency Use Authorization (EUA). This EUA will remain in effect (meaning this test can be used) for the duration of the COVID-19 declaration under Section 564(b)(1) of the Act, 21 U.S.C. section 360bbb-3(b)(1), unless the authorization is terminated or revoked.     Resp Syncytial Virus by PCR NEGATIVE NEGATIVE Final    Comment: (NOTE) Fact Sheet for Patients: bloggercourse.com  Fact Sheet for Healthcare Providers: seriousbroker.it  This test is not yet approved or cleared by the [place]  FDA and has been authorized for detection and/or diagnosis of SARS-CoV-2 by FDA under an Emergency Use Authorization (EUA). This EUA will remain in effect (meaning this test can be used) for the duration of the COVID-19 declaration under Section 564(b)(1) of the Act, 21 U.S.C. section 360bbb-3(b)(1), unless the authorization is terminated or revoked.  Performed at [facility], [address] [place], [place] [address]   Gastrointestinal Panel by PCR , Stool     Status: None   Collection Time: 05/09/23 10:45 PM   Specimen: Anterior Nasal Swab; Stool  Result Value Ref Range Status   Campylobacter species NOT DETECTED NOT DETECTED Final   Plesimonas shigelloides NOT DETECTED NOT DETECTED Final   Salmonella species NOT DETECTED NOT DETECTED Final   Yersinia enterocolitica NOT DETECTED NOT DETECTED Final   Vibrio species NOT DETECTED NOT DETECTED Final   Vibrio cholerae NOT DETECTED NOT DETECTED Final   Enteroaggregative E coli (EAEC) NOT DETECTED NOT DETECTED Final   Enteropathogenic E coli (EPEC) NOT DETECTED NOT DETECTED Final    Enterotoxigenic E coli (ETEC) NOT DETECTED NOT DETECTED Final   Shiga like toxin producing E coli (STEC) NOT DETECTED NOT DETECTED Final   Shigella/Enteroinvasive E coli (EIEC) NOT DETECTED NOT DETECTED Final   Cryptosporidium NOT DETECTED NOT DETECTED Final  Cyclospora cayetanensis NOT DETECTED NOT DETECTED Final   Entamoeba histolytica NOT DETECTED NOT DETECTED Final   Giardia lamblia NOT DETECTED NOT DETECTED Final   Adenovirus F40/41 NOT DETECTED NOT DETECTED Final   Astrovirus NOT DETECTED NOT DETECTED Final   Norovirus GI/GII NOT DETECTED NOT DETECTED Final   Rotavirus A NOT DETECTED NOT DETECTED Final   Sapovirus (I, II, IV, and V) NOT DETECTED NOT DETECTED Final    Comment: Performed at [facility], [address] [place], [place] [address]  MRSA Next Gen by PCR, Nasal     Status: Abnormal   Collection Time: 05/10/23  1:09 AM   Specimen: Nasal Mucosa; Nasal Swab  Result Value Ref Range Status   MRSA by PCR Next Gen DETECTED (A) NOT DETECTED Final    Comment: (NOTE) The GeneXpert MRSA Assay (FDA approved for NASAL specimens only), is one component of a comprehensive MRSA colonization surveillance program. It is not intended to diagnose MRSA infection nor to guide or monitor treatment for MRSA infections. Test performance is not FDA approved in patients less than 58 years old. Performed at [facility], [address], [place], [place] [address]   Respiratory (~20 pathogens) panel by PCR     Status: Abnormal   Collection Time: 05/10/23 12:46 PM   Specimen: Nasopharyngeal Swab; Respiratory  Result Value Ref Range Status   Adenovirus NOT DETECTED NOT DETECTED Final   Coronavirus 229E NOT DETECTED NOT DETECTED Final    Comment: (NOTE) The Coronavirus on the Respiratory Panel, DOES NOT test for the novel  Coronavirus (2019 nCoV)    Coronavirus HKU1 NOT DETECTED NOT DETECTED Final   Coronavirus NL63 NOT DETECTED NOT DETECTED Final   Coronavirus  OC43 NOT DETECTED NOT DETECTED Final   Metapneumovirus NOT DETECTED NOT DETECTED Final   Rhinovirus / Enterovirus NOT DETECTED NOT DETECTED Final   Influenza A H1 2009 DETECTED (A) NOT DETECTED Final   Influenza B NOT DETECTED NOT DETECTED Final   Parainfluenza Virus 1 NOT DETECTED NOT DETECTED Final   Parainfluenza Virus 2 NOT DETECTED NOT DETECTED Final   Parainfluenza Virus 3 NOT DETECTED NOT DETECTED Final   Parainfluenza Virus 4 NOT DETECTED NOT DETECTED Final   Respiratory Syncytial Virus NOT DETECTED NOT DETECTED Final   Bordetella pertussis NOT DETECTED NOT DETECTED Final   Bordetella Parapertussis NOT DETECTED NOT DETECTED Final   Chlamydophila pneumoniae NOT DETECTED NOT DETECTED Final   Mycoplasma pneumoniae NOT DETECTED NOT DETECTED Final    Comment: Performed at [facility], [address] [place], [place] [address]    Procedures/Studies: ECHOCARDIOGRAM COMPLETE Result Date: 05/11/2023    ECHOCARDIOGRAM REPORT   Patient Name:   MAELANI YARBRO Date of Exam: 05/11/2023 Medical Rec #:  [id number]        Height:       64.0 in Accession #:    [id number]       Weight:       104.7 lb Date of Birth:  1976-04-09        BSA:          1.486 m Patient Age:    47 years         BP:           99/70 mmHg Patient Gender: F                HR:           60 bpm. Exam Location:  Inpatient  Procedure: 2D Echo, Cardiac Doppler and Color Doppler Indications:    Bacteremia  History:        Patient has prior history of Echocardiogram examinations, most                 recent 12/20/2022. Prior CABG, Arrythmias:Cardiac Arrest; Risk                 Factors:Former Smoker. FLU positive. Breast cancer. Septic                 shock.  Sonographer:    Ellouise Mose RDCS Referring Phys: [id number] DORN KATHEE CHILL  Sonographer Comments: Technically difficult study due to poor echo windows and no apical window. Image acquisition challenging due to breast implants. IMPRESSIONS  1. Left ventricular ejection fraction, by  estimation, is 60 to 65%. The left ventricle has normal function. The left ventricle has no regional wall motion abnormalities. Left ventricular diastolic parameters were normal.  2. Right ventricular systolic function is normal. The right ventricular size is normal. There is normal pulmonary artery systolic pressure. The estimated right ventricular systolic pressure is 27.7 mmHg.  3. The mitral valve is normal in structure. No evidence of mitral valve regurgitation. No evidence of mitral stenosis.  4. The aortic valve is tricuspid. Aortic valve regurgitation is not visualized. No aortic stenosis is present.  5. The inferior vena cava is normal in size with <50% respiratory variability, suggesting right atrial pressure of 8 mmHg. FINDINGS  Left Ventricle: Left ventricular ejection fraction, by estimation, is 60 to 65%. The left ventricle has normal function. The left ventricle has no regional wall motion abnormalities. The left ventricular internal cavity size was normal in size. There is  no left ventricular hypertrophy. Left ventricular diastolic parameters were normal. Right Ventricle: The right ventricular size is normal. No increase in right ventricular wall thickness. Right ventricular systolic function is normal. There is normal pulmonary artery systolic pressure. The tricuspid regurgitant velocity is 2.22 m/s, and  with an assumed right atrial pressure of 8 mmHg, the estimated right ventricular systolic pressure is 27.7 mmHg. Left Atrium: Left atrial size was normal in size. Right Atrium: Right atrial size was normal in size. Pericardium: There is no evidence of pericardial effusion. Mitral Valve: The mitral valve is normal in structure. No evidence of mitral valve regurgitation. No evidence of mitral valve stenosis. Tricuspid Valve: The tricuspid valve is normal in structure. Tricuspid valve regurgitation is mild. Aortic Valve: The aortic valve is tricuspid. Aortic valve regurgitation is not visualized. No  aortic stenosis is present. Pulmonic Valve: The pulmonic valve was normal in structure. Pulmonic valve regurgitation is not visualized. Aorta: The aortic root is normal in size and structure. Venous: The inferior vena cava is normal in size with less than 50% respiratory variability, suggesting right atrial pressure of 8 mmHg. IAS/Shunts: No atrial level shunt detected by color flow Doppler.  LEFT VENTRICLE PLAX 2D LVIDd:         3.80 cm     Diastology LVIDs:         2.40 cm     LV e' medial:    7.62 cm/s LV PW:         0.70 cm     LV E/e' medial:  6.2 LV IVS:        0.80 cm     LV e' lateral:   10.00 cm/s LVOT diam:     1.80 cm     LV E/e' lateral: 4.8  LV SV:         38 LV SV Index:   25 LVOT Area:     2.54 cm  LV Volumes (MOD) LV vol d, MOD A4C: 64.5 ml LV vol s, MOD A4C: 26.7 ml LV SV MOD A4C:     64.5 ml RIGHT VENTRICLE             IVC RV S prime:     13.10 cm/s  IVC diam: 1.50 cm TAPSE (M-mode): 2.1 cm LEFT ATRIUM           Index        RIGHT ATRIUM          Index LA diam:      2.00 cm 1.35 cm/m   RA Area:     9.21 cm LA Vol (A4C): 22.5 ml 15.14 ml/m  RA Volume:   18.40 ml 12.38 ml/m  AORTIC VALVE LVOT Vmax:   71.60 cm/s LVOT Vmean:  42.400 cm/s LVOT VTI:    0.148 m  AORTA Ao Root diam: 2.90 cm MITRAL VALVE               TRICUSPID VALVE MV Area (PHT): 2.73 cm    TR Peak grad:   19.7 mmHg MV Decel Time: 278 msec    TR Vmax:        222.00 cm/s MV E velocity: 47.60 cm/s MV A velocity: 41.10 cm/s  SHUNTS MV E/A ratio:  1.16        Systemic VTI:  0.15 m                            Systemic Diam: 1.80 cm Dalton McleanMD Electronically signed by Ezra Kanner Signature Date/Time: 05/11/2023/8:52:09 AM    Final    CT ABDOMEN PELVIS W CONTRAST Result Date: 05/09/2023 CLINICAL DATA:  Provided history of sepsis. Patient reports weakness and chills. Radiologic records indicates history of metastatic breast cancer status post partial colectomy. EXAM: CT ABDOMEN AND PELVIS WITH CONTRAST TECHNIQUE: Multidetector CT  imaging of the abdomen and pelvis was performed using the standard protocol following bolus administration of intravenous contrast. RADIATION DOSE REDUCTION: This exam was performed according to the departmental dose-optimization program which includes automated exposure control, adjustment of the mA and/or kV according to patient size and/or use of iterative reconstruction technique. CONTRAST:  OMNIPAQUE  IOHEXOL  300 MG/ML  SOLN COMPARISON:  CT 04/13/2023, abscess injection 2 days ago reviewed FINDINGS: Lower chest: Scarring at the left lung base. Hepatobiliary: No focal liver abnormality. Possible small gallstone series 2, image 30. No abnormal gallbladder distention or pericholecystic inflammation. Pancreas: No ductal dilatation or inflammation. Spleen: Normal in size without focal abnormality. Adrenals/Urinary Tract: No adrenal nodule. Slight bilateral renal collecting system prominence, left greater than right. Equivocal enhancement of the left renal pelvis. No renal calculi. Unremarkable urinary bladder, no bladder wall thickening. Stomach/Bowel: Bowel assessment is limited in the absence of enteric contrast and paucity of intra-abdominal fat. Decompressed stomach. Subtotal colectomy. Small amount of fluid within the rectum, previous contrast in the rectum has cleared. Left abdominal ileostomy. Suggestion of mild diffuse small bowel wall thickening. No bowel pneumatosis. No abnormal distension or obstruction. Vascular/Lymphatic: Normal caliber abdominal aorta. Patent portal and splenic veins. Narrowing of the left renal vein as it courses under the SMA is again seen. No gross abdominopelvic adenopathy. Reproductive: Again seen uterine fibroids. Small amount of free fluid in the right adnexa/pelvis is unchanged from prior  exam. Other: Small amount of stranding in the right mid abdomen, similar to prior. Small amount of free fluid in the right pelvis and adnexa which was present on prior exam. No free air.  Musculoskeletal: No free air, free fluid, or intra-abdominal fluid collection. Left breast implant. Right mastectomy. IMPRESSION: 1. Suggestion of mild diffuse small bowel wall thickening which can be seen with enteritis. No obstruction. 2. Left-sided ileostomy post colectomy. 3. Small amount of free fluid in the right pelvis is well as adnexa, unchanged from prior exam. 4. Chronic uterine fibroids. Electronically Signed   By: Andrea Gasman M.D.   On: 05/09/2023 21:40   DG Chest Port 1 View Result Date: 05/09/2023 CLINICAL DATA:  Hypertension, weakness, chills with nausea and vomiting, concern for sepsis EXAM: PORTABLE CHEST 1 VIEW COMPARISON:  02/11/2023 FINDINGS: Normal heart size and vascularity. Left breast implant noted over the left hemithorax. Lungs remain clear. No focal pneumonia, collapse or consolidation. Negative for edema, large effusion or pneumothorax. Trachea midline. Right axillary clips present. Right IJ tunneled PICC line tip SVC RA junction. Overall stable exam. IMPRESSION: No acute chest process. Electronically Signed   By: CHRISTELLA.  Shick M.D.   On: 05/09/2023 16:45   IR Radiologist Eval & Mgmt Result Date: 05/07/2023 EXAM: ESTABLISHED PATIENT OFFICE VISIT CHIEF COMPLAINT: See Epic note. HISTORY OF PRESENT ILLNESS: See Epic note. REVIEW OF SYSTEMS: See Epic note. PHYSICAL EXAMINATION: See Epic note. ASSESSMENT AND PLAN: See Epic note. Ester Sides, MD [facility] Radiology Electronically Signed   By: Ester Sides M.D.   On: 05/07/2023 12:42   DG Sinus/Fist Tube Chk-Non GI Result Date: 05/07/2023 CLINICAL DATA:  48 year old with history of metastatic breast cancer status post partial colectomy complicated postoperatively by intra-abdominal fluid collection status post percutaneous drain placement on 01/11/2023. 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 Drain clinic follow up. EXAM: ABSCESS INJECTION COMPARISON:   04/13/2023 CONTRAST:  10 mL on the 300-administered via the existing percutaneous drain. FLUOROSCOPY TIME:  0.7 mGy TECHNIQUE: The patient was positioned supine on the fluoroscopy table. A preprocedural spot fluoroscopic image was obtained of the pelvis and the existing percutaneous drainage catheter. Multiple spot fluoroscopic and radiographic images were obtained following the injection of a small amount of contrast via the existing percutaneous drainage catheter. The external portion of the drain was cut to release and pigtail over the drain was removed successfully. Sterile bandage was applied. FINDINGS: No evidence of residual fluid collection or enteric abscess. IMPRESSION: Interval resolution of previously visualized rectal fistula. No significant residual pelvic fluid collection. The drain was removed. Ester Sides, MD [facility] Radiology Electronically Signed   By: Ester Sides M.D.   On: 05/07/2023 12:40   IR PATIENT EVAL TECH 0-60 MINS Result Date: 04/24/2023 Crawford Dorothyann POUR     04/25/2023 11:12 AM Patient presented to IR with pain from Stat Lock device.  Stat Lock was removed with adhesive remover, site cleaned with alcohol swab and Cinch stabilizing device was applied and redressed in a sterile fashion.  Educated patient on how to place cinch and to call IR department with any questions/ concerns she may have with the drain.   Jasmine Wolfe RT   Labs: BNP (last 3 results) No results for input(s): BNP in the last 8760 hours. Basic Metabolic Panel: Recent Labs  Lab 05/11/23 0645 05/12/23 1156 05/13/23 0649 05/14/23 0309 05/15/23 0348 05/16/23 0017 05/16/23 1131 05/17/23 0130  NA 134* 138 136 138  138 135 133* 133*  K 3.1* 4.0 3.4* 3.7 3.9 3.8 3.4* 3.6  CL 99 100 100 103 100 95* 95* 101  CO2 28 27 26 26 26 25 25 23   GLUCOSE 124* 130* 236* 85 84 83 102* 109*  BUN 10 22* 23* 19 26* 45* 48* 42*  CREATININE 0.91 0.80 0.94 0.79  1.07* 1.94* 1.86* 1.25*  CALCIUM  8.6* 9.4 9.0 9.4 9.8 10.2 9.7 8.4*  MG 1.7 2.2 2.0 1.9  --   --   --  2.0  PHOS 2.9 2.4* 3.6 3.0  --   --   --  3.6   Liver Function Tests: Recent Labs  Lab 05/12/23 1156 05/14/23 0309 05/17/23 0130  AST 28 32 39  ALT 54* 45* 49*  ALKPHOS 164* 156* 144*  BILITOT 0.8 0.7 0.8  PROT 7.3 7.7 7.3  ALBUMIN  3.1* 3.3* 3.0*   No results for input(s): LIPASE, AMYLASE in the last 168 hours. No results for input(s): AMMONIA in the last 168 hours. CBC: Recent Labs  Lab 05/11/23 0645 05/13/23 0649  WBC 9.5 7.9  HGB 8.3* 8.5*  HCT 26.6* 28.0*  MCV 87.5 87.5  PLT 221 270  Cardiac Enzymes: Recent Labs  Lab 05/11/23 0645  CKTOTAL <5*  BNP: Invalid input(s): POCBNP CBG: Recent Labs  Lab 05/14/23 2138 05/15/23 0351 05/16/23 2045 05/17/23 0558 05/17/23 0832  GLUCAP 146* 88 170* 113* 114*  Lipid Profile Recent Labs    05/17/23 0130  TRIG 169*  Thyroid  function studies No results for input(s): TSH, T4TOTAL, T3FREE, THYROIDAB in the last 72 hours.  Invalid input(s): FREET3 Anemia work up No results for input(s): VITAMINB12, FOLATE, FERRITIN, TIBC, IRON , RETICCTPCT in the last 72 hours. Urinalysis    Component Value Date/Time   COLORURINE YELLOW 05/09/2023 2039   APPEARANCEUR CLEAR 05/09/2023 2039   LABSPEC 1.013 05/09/2023 2039   PHURINE 6.0 05/09/2023 2039   GLUCOSEU NEGATIVE 05/09/2023 2039   HGBUR NEGATIVE 05/09/2023 2039   BILIRUBINUR NEGATIVE 05/09/2023 2039   KETONESUR NEGATIVE 05/09/2023 2039   PROTEINUR NEGATIVE 05/09/2023 2039   UROBILINOGEN 0.2 06/24/2007 0321   NITRITE NEGATIVE 05/09/2023 2039   LEUKOCYTESUR NEGATIVE 05/09/2023 2039  Sepsis Labs Recent Labs  Lab 05/11/23 0645 05/13/23 0649  WBC 9.5 7.9   Microbiology Recent Results (from the past 240 hours)  Blood Culture (routine x 2)     Status: Abnormal   Collection Time: 05/09/23  4:25 PM   Specimen: BLOOD  Result Value Ref Range  Status   Specimen Description   Final    BLOOD PORTA CATH Performed at [facility], [address] [place], [place] [address]    Special Requests   Final    BOTTLES DRAWN AEROBIC AND ANAEROBIC Blood Culture results may not be optimal due to an inadequate volume of blood received in culture bottles Performed at [facility], [address], [place], [place] [address]    Culture  Setup Time   Final    GRAM POSITIVE COCCI IN BOTH AEROBIC AND ANAEROBIC BOTTLES CRITICAL RESULT CALLED TO, READ BACK BY AND VERIFIED WITH: C SHADE,PHARMD@[id number]  05/10/23 MK Performed at [facility], [address] [place], [place] [address]    Culture STAPHYLOCOCCUS EPIDERMIDIS (A)  Final   Report Status 05/12/2023 FINAL  Final   Organism ID, Bacteria STAPHYLOCOCCUS EPIDERMIDIS  Final      Susceptibility   Staphylococcus epidermidis - MIC*    CIPROFLOXACIN  4 RESISTANT Resistant  ERYTHROMYCIN >=8 RESISTANT Resistant     GENTAMICIN  >=16 RESISTANT Resistant     OXACILLIN >=4 RESISTANT Resistant     TETRACYCLINE 2 SENSITIVE Sensitive     VANCOMYCIN  1 SENSITIVE Sensitive     TRIMETH /SULFA  40 SENSITIVE Sensitive     CLINDAMYCIN <=0.25 SENSITIVE Sensitive     RIFAMPIN <=0.5 SENSITIVE Sensitive     Inducible Clindamycin NEGATIVE Sensitive     * STAPHYLOCOCCUS EPIDERMIDIS  Blood Culture ID Panel (Reflexed)     Status: Abnormal   Collection Time: 05/09/23  4:25 PM  Result Value Ref Range Status   Enterococcus faecalis NOT DETECTED NOT DETECTED Final   Enterococcus Faecium NOT DETECTED NOT DETECTED Final   Listeria monocytogenes NOT DETECTED NOT DETECTED Final   Staphylococcus species DETECTED (A) NOT DETECTED Final    Comment: CRITICAL RESULT CALLED TO, READ BACK BY AND VERIFIED WITH: C SHADE,PHARMD@[id number]  05/10/23 MK    Staphylococcus aureus (BCID) NOT DETECTED NOT DETECTED Final   Staphylococcus epidermidis DETECTED (A) NOT DETECTED Final    Comment: Methicillin  (oxacillin) resistant coagulase negative staphylococcus. Possible blood culture contaminant (unless isolated from more than one blood culture draw or clinical case suggests pathogenicity). No antibiotic treatment is indicated for blood  culture contaminants. CRITICAL RESULT CALLED TO, READ BACK BY AND VERIFIED WITH: C SHADE,PHARMD@[id number]  05/10/23 MK    Staphylococcus lugdunensis NOT DETECTED NOT DETECTED Final   Streptococcus species NOT DETECTED NOT DETECTED Final   Streptococcus agalactiae NOT DETECTED NOT DETECTED Final   Streptococcus pneumoniae NOT DETECTED NOT DETECTED Final   Streptococcus pyogenes NOT DETECTED NOT DETECTED Final   A.calcoaceticus-baumannii NOT DETECTED NOT DETECTED Final   Bacteroides fragilis NOT DETECTED NOT DETECTED Final   Enterobacterales NOT DETECTED NOT DETECTED Final   Enterobacter cloacae complex NOT DETECTED NOT DETECTED Final   Escherichia coli NOT DETECTED NOT DETECTED Final   Klebsiella aerogenes NOT DETECTED NOT DETECTED Final   Klebsiella oxytoca NOT DETECTED NOT DETECTED Final   Klebsiella pneumoniae NOT DETECTED NOT DETECTED Final   Proteus species NOT DETECTED NOT DETECTED Final   Salmonella species NOT DETECTED NOT DETECTED Final   Serratia marcescens NOT DETECTED NOT DETECTED Final   Haemophilus influenzae NOT DETECTED NOT DETECTED Final   Neisseria meningitidis NOT DETECTED NOT DETECTED Final   Pseudomonas aeruginosa NOT DETECTED NOT DETECTED Final   Stenotrophomonas maltophilia NOT DETECTED NOT DETECTED Final   Candida albicans NOT DETECTED NOT DETECTED Final   Candida auris NOT DETECTED NOT DETECTED Final   Candida glabrata NOT DETECTED NOT DETECTED Final   Candida krusei NOT DETECTED NOT DETECTED Final   Candida parapsilosis NOT DETECTED NOT DETECTED Final   Candida tropicalis NOT DETECTED NOT DETECTED Final   Cryptococcus neoformans/gattii NOT DETECTED NOT DETECTED Final   Methicillin resistance mecA/C DETECTED (A) NOT DETECTED Final     Comment: CRITICAL RESULT CALLED TO, READ BACK BY AND VERIFIED WITH: C SHADE,PHARMD@[id number]  05/10/23 MK Performed at [facility], [address], [place], [place] [address]   Blood Culture (routine x 2)     Status: Abnormal   Collection Time: 05/09/23  4:40 PM   Specimen: BLOOD  Result Value Ref Range Status   Specimen Description   Final    BLOOD LEFT ANTECUBITAL Performed at [facility], [address], [place], [place] [address]    Special Requests   Final    BOTTLES DRAWN AEROBIC AND ANAEROBIC Blood Culture results may not be optimal due to an inadequate volume of blood  received in culture bottles Performed at [facility], [address], [place], [place] [address]    Culture  Setup Time   Final    GRAM POSITIVE COCCI IN CHAINS ANAEROBIC BOTTLE ONLY CRITICAL RESULT CALLED TO, READ BACK BY AND VERIFIED WITH: PHARMD CHRISTINE SHADE ON 05/10/23 @ 1239 BY DRT Performed at [facility], [address], [place], [place] [address]    Culture STREPTOCOCCUS MITIS/ORALIS (A)  Final   Report Status 05/13/2023 FINAL  Final   Organism ID, Bacteria STREPTOCOCCUS MITIS/ORALIS  Final      Susceptibility   Streptococcus mitis/oralis - MIC*    PENICILLIN  <=0.06 SENSITIVE Sensitive     CEFTRIAXONE  <=0.12 SENSITIVE Sensitive     LEVOFLOXACIN  1 SENSITIVE Sensitive     VANCOMYCIN  0.5 SENSITIVE Sensitive     * STREPTOCOCCUS MITIS/ORALIS  Fungus culture, blood     Status: Abnormal   Collection Time: 05/09/23  4:40 PM   Specimen: BLOOD  Result Value Ref Range Status   Specimen Description   Final    BLOOD LEFT ANTECUBITAL Performed at [facility], [address] [place], [place] [address]    Special Requests   Final    NONE Performed at [facility], [address], [place], [place] [address]    Fungal Smear   Final    GRAM POSITIVE COCCI IN CLUSTERS AEROBIC BOTTLE ONLY GRAM POSITIVE COCCI IN CHAINS ANAEROBIC BOTTLE  ONLY CRITICAL VALUE NOTED.  VALUE IS CONSISTENT WITH PREVIOUSLY REPORTED AND CALLED VALUE. Performed at [facility], [address], [place], [place] [address]    Culture (A)  Final    STREPTOCOCCUS MITIS/ORALIS STAPHYLOCOCCUS EPIDERMIDIS    Report Status 05/15/2023 FINAL  Final   Organism ID, Bacteria STREPTOCOCCUS MITIS/ORALIS  Final   Organism ID, Bacteria STAPHYLOCOCCUS EPIDERMIDIS  Final      Susceptibility   Staphylococcus epidermidis - MIC*    CIPROFLOXACIN  4 RESISTANT Resistant     ERYTHROMYCIN >=8 RESISTANT Resistant     GENTAMICIN  <=0.5 SENSITIVE Sensitive     OXACILLIN <=0.25 SENSITIVE Sensitive     TETRACYCLINE 2 SENSITIVE Sensitive     VANCOMYCIN  1 SENSITIVE Sensitive     TRIMETH /SULFA  40 SENSITIVE Sensitive     CLINDAMYCIN <=0.25 SENSITIVE Sensitive     RIFAMPIN <=0.5 SENSITIVE Sensitive     Inducible Clindamycin NEGATIVE Sensitive     * STAPHYLOCOCCUS EPIDERMIDIS   Streptococcus mitis/oralis - MIC*    PENICILLIN  0.12 SENSITIVE Sensitive     CEFTRIAXONE  <=0.12 SENSITIVE Sensitive     LEVOFLOXACIN  1 SENSITIVE Sensitive     VANCOMYCIN  0.5 SENSITIVE Sensitive     * STREPTOCOCCUS MITIS/ORALIS  Blood Culture ID Panel (Reflexed)     Status: Abnormal   Collection Time: 05/09/23  4:40 PM  Result Value Ref Range Status   Enterococcus faecalis NOT DETECTED NOT DETECTED Final   Enterococcus Faecium NOT DETECTED NOT DETECTED Final   Listeria monocytogenes NOT DETECTED NOT DETECTED Final   Staphylococcus species NOT DETECTED NOT DETECTED Final   Staphylococcus aureus (BCID) NOT DETECTED NOT DETECTED Final   Staphylococcus epidermidis NOT DETECTED NOT DETECTED Final   Staphylococcus lugdunensis NOT DETECTED NOT DETECTED Final   Streptococcus species DETECTED (A) NOT DETECTED Final    Comment: Not Enterococcus species, Streptococcus agalactiae, Streptococcus pyogenes, or Streptococcus pneumoniae. CRITICAL RESULT CALLED TO, READ BACK BY AND VERIFIED WITH: PHARMD  CHRISTINE SHADE ON 05/10/23 @ 1239 BY DRT    Streptococcus agalactiae  NOT DETECTED NOT DETECTED Final   Streptococcus pneumoniae NOT DETECTED NOT DETECTED Final   Streptococcus pyogenes NOT DETECTED NOT DETECTED Final   A.calcoaceticus-baumannii NOT DETECTED NOT DETECTED Final   Bacteroides fragilis NOT DETECTED NOT DETECTED Final   Enterobacterales NOT DETECTED NOT DETECTED Final   Enterobacter cloacae complex NOT DETECTED NOT DETECTED Final   Escherichia coli NOT DETECTED NOT DETECTED Final   Klebsiella aerogenes NOT DETECTED NOT DETECTED Final   Klebsiella oxytoca NOT DETECTED NOT DETECTED Final   Klebsiella pneumoniae NOT DETECTED NOT DETECTED Final   Proteus species NOT DETECTED NOT DETECTED Final   Salmonella species NOT DETECTED NOT DETECTED Final   Serratia marcescens NOT DETECTED NOT DETECTED Final   Haemophilus influenzae NOT DETECTED NOT DETECTED Final   Neisseria meningitidis NOT DETECTED NOT DETECTED Final   Pseudomonas aeruginosa NOT DETECTED NOT DETECTED Final   Stenotrophomonas maltophilia NOT DETECTED NOT DETECTED Final   Candida albicans NOT DETECTED NOT DETECTED Final   Candida auris NOT DETECTED NOT DETECTED Final   Candida glabrata NOT DETECTED NOT DETECTED Final   Candida krusei NOT DETECTED NOT DETECTED Final   Candida parapsilosis NOT DETECTED NOT DETECTED Final   Candida tropicalis NOT DETECTED NOT DETECTED Final   Cryptococcus neoformans/gattii NOT DETECTED NOT DETECTED Final    Comment: Performed at [facility], [address] [place], [place] [address]  Resp panel by RT-PCR (RSV, Flu A&B, Covid) Anterior Nasal Swab     Status: Abnormal   Collection Time: 05/09/23 10:45 PM   Specimen: Anterior Nasal Swab  Result Value Ref Range Status   SARS Coronavirus 2 by RT PCR NEGATIVE NEGATIVE Final    Comment: (NOTE) SARS-CoV-2 target nucleic acids are NOT DETECTED.  The SARS-CoV-2 RNA is generally detectable in upper respiratory specimens during the acute  phase of infection. The lowest concentration of SARS-CoV-2 viral copies this assay can detect is 138 copies/mL. A negative result does not preclude SARS-Cov-2 infection and should not be used as the sole basis for treatment or other patient management decisions. A negative result may occur with  improper specimen collection/handling, submission of specimen other than nasopharyngeal swab, presence of viral mutation(s) within the areas targeted by this assay, and inadequate number of viral copies(<138 copies/mL). A negative result must be combined with clinical observations, patient history, and epidemiological information. The expected result is Negative.  Fact Sheet for Patients:  bloggercourse.com  Fact Sheet for Healthcare Providers:  seriousbroker.it  This test is no t yet approved or cleared by the [place]  FDA and  has been authorized for detection and/or diagnosis of SARS-CoV-2 by FDA under an Emergency Use Authorization (EUA). This EUA will remain  in effect (meaning this test can be used) for the duration of the COVID-19 declaration under Section 564(b)(1) of the Act, 21 U.S.C.section 360bbb-3(b)(1), unless the authorization is terminated  or revoked sooner.       Influenza A by PCR POSITIVE (A) NEGATIVE Final   Influenza B by PCR NEGATIVE NEGATIVE Final    Comment: (NOTE) The Xpert Xpress SARS-CoV-2/FLU/RSV plus assay is intended as an aid in the diagnosis of influenza from Nasopharyngeal swab specimens and should not be used as a sole basis for treatment. Nasal washings and aspirates are unacceptable for Xpert Xpress SARS-CoV-2/FLU/RSV testing.  Fact Sheet for Patients: bloggercourse.com  Fact Sheet for Healthcare Providers: seriousbroker.it  This test is not yet approved or cleared by the [place]  FDA and has been authorized for detection and/or diagnosis  of SARS-CoV-2 by FDA under an Emergency Use Authorization (EUA). This EUA will remain in effect (meaning this test can be used) for the duration of the COVID-19 declaration under Section 564(b)(1) of the Act, 21 U.S.C. section 360bbb-3(b)(1), unless the authorization is terminated or revoked.     Resp Syncytial Virus by PCR NEGATIVE NEGATIVE Final    Comment: (NOTE) Fact Sheet for Patients: bloggercourse.com  Fact Sheet for Healthcare Providers: seriousbroker.it  This test is not yet approved or cleared by the [place]  FDA and has been authorized for detection and/or diagnosis of SARS-CoV-2 by FDA under an Emergency Use Authorization (EUA). This EUA will remain in effect (meaning this test can be used) for the duration of the COVID-19 declaration under Section 564(b)(1) of the Act, 21 U.S.C. section 360bbb-3(b)(1), unless the authorization is terminated or revoked.  Performed at [facility], [address], [place], [place] [address]   Gastrointestinal Panel by PCR , Stool     Status: None   Collection Time: 05/09/23 10:45 PM   Specimen: Anterior Nasal Swab; Stool  Result Value Ref Range Status   Campylobacter species NOT DETECTED NOT DETECTED Final   Plesimonas shigelloides NOT DETECTED NOT DETECTED Final   Salmonella species NOT DETECTED NOT DETECTED Final   Yersinia enterocolitica NOT DETECTED NOT DETECTED Final   Vibrio species NOT DETECTED NOT DETECTED Final   Vibrio cholerae NOT DETECTED NOT DETECTED Final   Enteroaggregative E coli (EAEC) NOT DETECTED NOT DETECTED Final   Enteropathogenic E coli (EPEC) NOT DETECTED NOT DETECTED Final   Enterotoxigenic E coli (ETEC) NOT DETECTED NOT DETECTED Final   Shiga like toxin producing E coli (STEC) NOT DETECTED NOT DETECTED Final   Shigella/Enteroinvasive E coli (EIEC) NOT DETECTED NOT DETECTED Final   Cryptosporidium NOT DETECTED NOT DETECTED Final    Cyclospora cayetanensis NOT DETECTED NOT DETECTED Final   Entamoeba histolytica NOT DETECTED NOT DETECTED Final   Giardia lamblia NOT DETECTED NOT DETECTED Final   Adenovirus F40/41 NOT DETECTED NOT DETECTED Final   Astrovirus NOT DETECTED NOT DETECTED Final   Norovirus GI/GII NOT DETECTED NOT DETECTED Final   Rotavirus A NOT DETECTED NOT DETECTED Final   Sapovirus (I, II, IV, and V) NOT DETECTED NOT DETECTED Final    Comment: Performed at [facility], [address] [place], [place] [address]  MRSA Next Gen by PCR, Nasal     Status: Abnormal   Collection Time: 05/10/23  1:09 AM   Specimen: Nasal Mucosa; Nasal Swab  Result Value Ref Range Status   MRSA by PCR Next Gen DETECTED (A) NOT DETECTED Final    Comment: (NOTE) The GeneXpert MRSA Assay (FDA approved for NASAL specimens only), is one component of a comprehensive MRSA colonization surveillance program. It is not intended to diagnose MRSA infection nor to guide or monitor treatment for MRSA infections. Test performance is not FDA approved in patients less than 59 years old. Performed at [facility], [address], [place], [place] [address]   Respiratory (~20 pathogens) panel by PCR     Status: Abnormal   Collection Time: 05/10/23 12:46 PM   Specimen: Nasopharyngeal Swab; Respiratory  Result Value Ref Range Status   Adenovirus NOT DETECTED NOT DETECTED Final   Coronavirus 229E NOT DETECTED NOT DETECTED Final    Comment: (NOTE) The Coronavirus on the Respiratory Panel, DOES NOT test for the novel  Coronavirus (2019 nCoV)    Coronavirus HKU1 NOT DETECTED NOT DETECTED Final  Coronavirus NL63 NOT DETECTED NOT DETECTED Final   Coronavirus OC43 NOT DETECTED NOT DETECTED Final   Metapneumovirus NOT DETECTED NOT DETECTED Final   Rhinovirus / Enterovirus NOT DETECTED NOT DETECTED Final   Influenza A H1 2009 DETECTED (A) NOT DETECTED Final   Influenza B NOT DETECTED NOT DETECTED Final    Parainfluenza Virus 1 NOT DETECTED NOT DETECTED Final   Parainfluenza Virus 2 NOT DETECTED NOT DETECTED Final   Parainfluenza Virus 3 NOT DETECTED NOT DETECTED Final   Parainfluenza Virus 4 NOT DETECTED NOT DETECTED Final   Respiratory Syncytial Virus NOT DETECTED NOT DETECTED Final   Bordetella pertussis NOT DETECTED NOT DETECTED Final   Bordetella Parapertussis NOT DETECTED NOT DETECTED Final   Chlamydophila pneumoniae NOT DETECTED NOT DETECTED Final   Mycoplasma pneumoniae NOT DETECTED NOT DETECTED Final    Comment: Performed at [facility], [address] [place], [place] [address]   Time coordinating discharge: 25 minutes  SIGNED: Mennie LAMY, MD  [facility] 05/17/2023, 12:58 PM  If 7PM-7AM, please contact night-coverage [email or web address]

## 2023-05-16 DIAGNOSIS — A419 Sepsis, unspecified organism: Secondary | ICD-10-CM | POA: Diagnosis not present

## 2023-05-16 DIAGNOSIS — K50919 Crohn's disease, unspecified, with unspecified complications: Secondary | ICD-10-CM | POA: Diagnosis not present

## 2023-05-16 DIAGNOSIS — D72829 Elevated white blood cell count, unspecified: Secondary | ICD-10-CM | POA: Diagnosis not present

## 2023-05-16 DIAGNOSIS — I959 Hypotension, unspecified: Secondary | ICD-10-CM | POA: Diagnosis not present

## 2023-05-16 DIAGNOSIS — R6521 Severe sepsis with septic shock: Secondary | ICD-10-CM | POA: Diagnosis not present

## 2023-05-16 LAB — BASIC METABOLIC PANEL
Anion gap: 15 (ref 5–15)
Anion gap: 13 (ref 5–15)
BUN: 45 mg/dL — ABNORMAL HIGH (ref 6–20)
BUN: 48 mg/dL — ABNORMAL HIGH (ref 6–20)
CO2: 25 mmol/L (ref 22–32)
CO2: 25 mmol/L (ref 22–32)
Calcium: 10.2 mg/dL (ref 8.9–10.3)
Calcium: 9.7 mg/dL (ref 8.9–10.3)
Chloride: 95 mmol/L — ABNORMAL LOW (ref 98–111)
Chloride: 95 mmol/L — ABNORMAL LOW (ref 98–111)
Creatinine, Ser: 1.94 mg/dL — ABNORMAL HIGH (ref 0.44–1.00)
Creatinine, Ser: 1.86 mg/dL — ABNORMAL HIGH (ref 0.44–1.00)
GFR, Estimated: 32 mL/min — ABNORMAL LOW (ref >60)
GFR, Estimated: 34 mL/min — ABNORMAL LOW (ref >60)
Glucose, Bld: 83 mg/dL (ref 70–99)
Glucose, Bld: 102 mg/dL — ABNORMAL HIGH (ref 70–99)
Potassium: 3.8 mmol/L (ref 3.5–5.1)
Potassium: 3.4 mmol/L — ABNORMAL LOW (ref 3.5–5.1)
Sodium: 135 mmol/L (ref 135–145)
Sodium: 133 mmol/L — ABNORMAL LOW (ref 135–145)

## 2023-05-16 LAB — GLUCOSE, CAPILLARY: Glucose-Capillary: 170 mg/dL — ABNORMAL HIGH (ref 70–99)

## 2023-05-16 MED ORDER — SODIUM CHLORIDE 0.9 % IV BOLUS
1000.0000 mL | Freq: Once | INTRAVENOUS | Status: AC
  Administered 2023-05-16: 1000 mL via INTRAVENOUS

## 2023-05-16 MED ORDER — SODIUM CHLORIDE 0.9 % IV SOLN: INTRAVENOUS | Status: AC

## 2023-05-16 MED ORDER — INSULIN ASPART 100 UNIT/ML IJ SOLN: 0.0000 [IU] | Freq: Three times a day (TID) | INTRAMUSCULAR | Status: DC

## 2023-05-16 MED ORDER — TRAVASOL 10 % IV SOLN
INTRAVENOUS | Status: AC
  Filled 2023-05-16: qty 795

## 2023-05-16 MED ORDER — SODIUM CHLORIDE 0.9 % IV SOLN
INTRAVENOUS | Status: DC
Start: 2023-05-16 — End: 2023-05-16

## 2023-05-16 NOTE — Plan of Care (Signed)
  Problem: Clinical Measurements: Goal: Ability to maintain clinical measurements within normal limits will improve Outcome: Progressing   Problem: Education: Goal: Knowledge of General Education information will improve Description: Including pain rating scale, medication(s)/side effects and non-pharmacologic comfort measures Outcome: Progressing   Problem: Activity: Goal: Risk for activity intolerance will decrease Outcome: Progressing   Problem: Clinical Measurements: Goal: Will remain free from infection Outcome: Progressing

## 2023-05-16 NOTE — Progress Notes (Signed)
 PHARMACY - TOTAL PARENTERAL NUTRITION CONSULT NOTE   Indication: Short bowel syndrome  Patient Measurements: Height: 5' 4 (162.6 cm) Weight: 42.9 kg (94 lb 9.2 oz) IBW/kg (Calculated) : 54.7 TPN AdjBW (KG): 47.5 Body mass index is 16.23 kg/m. 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 Last administered: 1/27-1/28 1/31 TPN formula from [employer] faxed to [facility]  Glucose / Insulin : No history of DM - CBGs stable WNL off TPN (still receiving PO [employer] supplements and ordered diet) Electrolytes: noted to have new AKI today - Na, K, Cl, low - Bicarb WNL - Mg, Phos pending, but were WNL on 2/3 Renal: SCr, BUN up acutely today; UOP not being charted Hepatic: pending; alk phos mildly elevated 2/3 I/O: no output charted yesterday other than small amount of stool GI Imaging: 1/29 No obstruction. Left-sided ileostomy post colectomy. GI Surgeries / Procedures: none this admission  Central access: prior to admission CVC double lumen in place TPN start date: resume 2/5  Nutritional Goals:  - From [employer], MWF with lipids cyclic TPN formula provides 80 g of protein and 1214 kcals per day - 1/31 Inpt TPN with lipids cyclic TPN formula provides 80 g of protein and 1230 kcal per day  RD Assessment:  Estimated Needs Total Energy Estimated Needs: 1650-1800 kcals Total Protein Estimated Needs: 80-95 grams Total Fluid Estimated Needs: >/= 1.7L  Current Nutrition:  Regular diet and TPN  Plan:  Resume cyclic TPN at 1800 1500 ml given over 16-hr cycle (1 hr ramp-up/ramp-down) Electrolytes in TPN: reduce K, Phos slightly given new AKI Na 154 mEq/L K 40 mEq/L Ca 4 mEq/L Mg 5.5 mEq/L Phos 8 mmol/L Cl:Ac Max Cl Add standard MVI and trace elements to TPN Chromium remains on hold d/t nationwide shortage Will stop NS infusion while on cyclic TPN (max rate 100  ml/hr) Continue very sensitive SSI with q8 hr CBG checks and adjust as needed  Monitor TPN labs on tomorrow then Mon/Thurs Full panel tomorrow  Thank you for allowing pharmacy to be a part of this patient's care.  Bard Jeans, PharmD, BCPS [phone] 05/16/2023, 11:23 AM

## 2023-05-16 NOTE — Plan of Care (Signed)

## 2023-05-16 NOTE — Progress Notes (Signed)
   05/16/23 0450 05/16/23 0559  Assess: MEWS Score  Temp 97.6 F (36.4 C)  --   BP 98/74 109/81  MAP (mmHg) 82 90  Pulse Rate (!) 108 99  Resp 18  --   Level of Consciousness Alert  --   SpO2 100 % 100 %  O2 Device Room Air  --   Assess: MEWS Score  MEWS Temp 0 0  MEWS Systolic 1 0  MEWS Pulse 1 0  MEWS RR 0 0  MEWS LOC 0 0  MEWS Score 2 0  MEWS Score Color Yellow Green  Assess: SIRS CRITERIA  SIRS Temperature  0 0  SIRS Respirations  0 0  SIRS Pulse 1 1  SIRS WBC 0 0  SIRS Score Sum  1 1

## 2023-05-16 NOTE — Progress Notes (Signed)
 Subjective:  No new complaints  Antibiotics:  Anti-infectives (From admission, onward)    Start     Dose/Rate Route Frequency Ordered Stop   05/12/23 2200  oseltamivir  (TAMIFLU ) capsule 75 mg       Placed in Followed by Linked Group   75 mg Oral 2 times daily 05/12/23 1537 05/14/23 2103   05/10/23 2300  fluconazole  (DIFLUCAN ) IVPB 200 mg  Status:  Discontinued       Placed in Followed by Linked Group   200 mg 100 mL/hr over 60 Minutes Intravenous Every 24 hours 05/09/23 2249 05/10/23 1443   05/10/23 2200  oseltamivir  (TAMIFLU ) capsule 30 mg  Status:  Discontinued       Placed in Followed by Linked Group   30 mg Oral 2 times daily 05/10/23 0844 05/12/23 1537   05/10/23 1530  DAPTOmycin  (CUBICIN ) IVPB 500 mg/50mL premix  Status:  Discontinued        500 mg 100 mL/hr over 30 Minutes Intravenous Daily 05/10/23 1443 05/15/23 0821   05/10/23 1000  ceFEPIme  (MAXIPIME ) 2 g in sodium chloride  0.9 % 100 mL IVPB  Status:  Discontinued        2 g 200 mL/hr over 30 Minutes Intravenous Every 12 hours 05/10/23 0503 05/10/23 1443   05/10/23 1000  oseltamivir  (TAMIFLU ) capsule 75 mg  Status:  Discontinued        75 mg Oral 2 times daily 05/10/23 0838 05/10/23 0845   05/10/23 0930  oseltamivir  (TAMIFLU ) capsule 75 mg       Placed in Followed by Linked Group   75 mg Oral  Once 05/10/23 0844 05/10/23 0938   05/10/23 0900  linezolid  (ZYVOX ) IVPB 600 mg  Status:  Discontinued        600 mg 300 mL/hr over 60 Minutes Intravenous Every 12 hours 05/10/23 0758 05/10/23 1443   05/10/23 0600  metroNIDAZOLE  (FLAGYL ) IVPB 500 mg  Status:  Discontinued        500 mg 100 mL/hr over 60 Minutes Intravenous Every 12 hours 05/10/23 0456 05/10/23 1443   05/09/23 2300  piperacillin -tazobactam (ZOSYN ) IVPB 3.375 g  Status:  Discontinued        3.375 g 12.5 mL/hr over 240 Minutes Intravenous Every 8 hours 05/09/23 2244 05/10/23 0456   05/09/23 2300  fluconazole  (DIFLUCAN ) IVPB 400 mg       Placed  in Followed by Linked Group   400 mg 100 mL/hr over 120 Minutes Intravenous  Once 05/09/23 2249 05/10/23 0855   05/09/23 2215  ceFEPIme  (MAXIPIME ) 2 g in sodium chloride  0.9 % 100 mL IVPB  Status:  Discontinued        2 g 200 mL/hr over 30 Minutes Intravenous  Once 05/09/23 2214 05/09/23 2237   05/09/23 1715  vancomycin  (VANCOCIN ) IVPB 1000 mg/200 mL premix        1,000 mg 200 mL/hr over 60 Minutes Intravenous  Once 05/09/23 1706 05/09/23 2338   05/09/23 1715  ceFEPIme  (MAXIPIME ) 2 g in sodium chloride  0.9 % 100 mL IVPB  Status:  Discontinued        2 g 200 mL/hr over 30 Minutes Intravenous  Once 05/09/23 1706 05/09/23 2214       Medications: Scheduled Meds:  apixaban   5 mg Oral BID   feeding supplement (KATE FARMS STANDARD 1.4)  325 mL Oral BID BM   midodrine   10 mg Oral TID WC   sodium chloride  flush  10-40 mL Intracatheter  Q12H   Continuous Infusions:   PRN Meds:.docusate sodium , ondansetron  (ZOFRAN ) IV, mouth rinse, polyethylene glycol, sodium chloride  flush    Objective: Weight change:   Intake/Output Summary (Last 24 hours) at 05/16/2023 0912 Last data filed at 05/15/2023 2232 Gross per 24 hour  Intake 120 ml  Output 300 ml  Net -180 ml   Blood pressure 109/81, pulse 99, temperature 97.6 F (36.4 C), resp. rate 18, height 5' 4 (1.626 m), weight 42.9 kg, last menstrual period 04/24/2021, SpO2 100%. Temp:  [97.6 F (36.4 C)-98.3 F (36.8 C)] 97.6 F (36.4 C) (02/05 0450) Pulse Rate:  [84-108] 99 (02/05 0559) Resp:  [16-18] 18 (02/05 0450) BP: (98-109)/(74-87) 109/81 (02/05 0559) SpO2:  [100 %] 100 % (02/05 0559)  Physical Exam: Physical Exam Constitutional:      General: She is not in acute distress.    Appearance: She is well-developed. She is not diaphoretic.  HENT:     Head: Normocephalic and atraumatic.     Right Ear: External ear normal.     Left Ear: External ear normal.     Mouth/Throat:     Pharynx: No oropharyngeal exudate.  Eyes:      General: No scleral icterus.    Conjunctiva/sclera: Conjunctivae normal.     Pupils: Pupils are equal, round, and reactive to light.  Cardiovascular:     Rate and Rhythm: Normal rate and regular rhythm.  Pulmonary:     Effort: Pulmonary effort is normal. No respiratory distress.     Breath sounds: No wheezing.  Abdominal:     General: There is no distension.     Palpations: Abdomen is soft.  Musculoskeletal:        General: No tenderness. Normal range of motion.  Lymphadenopathy:     Cervical: No cervical adenopathy.  Skin:    General: Skin is warm and dry.     Coloration: Skin is not pale.     Findings: No erythema or rash.  Neurological:     General: No focal deficit present.     Mental Status: She is alert and oriented to person, place, and time.     Motor: No abnormal muscle tone.     Coordination: Coordination normal.  Psychiatric:        Mood and Affect: Mood normal.        Behavior: Behavior normal.        Thought Content: Thought content normal.        Judgment: Judgment normal.    Port is clean  CBC:    BMET Recent Labs    05/15/23 0348 05/16/23 0017  NA 138 135  K 3.9 3.8  CL 100 95*  CO2 26 25  GLUCOSE 84 83  BUN 26* 45*  CREATININE 1.07* 1.94*  CALCIUM  9.8 10.2     Liver Panel  Recent Labs    05/14/23 0309  PROT 7.7  ALBUMIN  3.3*  AST 32  ALT 45*  ALKPHOS 156*  BILITOT 0.7       Sedimentation Rate No results for input(s): ESRSEDRATE in the last 72 hours. C-Reactive Protein No results for input(s): CRP in the last 72 hours.  Micro Results: Recent Results (from the past 720 hours)  Blood Culture (routine x 2)     Status: Abnormal   Collection Time: 05/09/23  4:25 PM   Specimen: BLOOD  Result Value Ref Range Status   Specimen Description   Final    BLOOD PORTA CATH Performed at [facility], [address], [place], [place] [address]    Special Requests   Final    BOTTLES DRAWN AEROBIC AND ANAEROBIC  Blood Culture results may not be optimal due to an inadequate volume of blood received in culture bottles Performed at [facility], [address] [place], [place] [address]    Culture  Setup Time   Final    GRAM POSITIVE COCCI IN BOTH AEROBIC AND ANAEROBIC BOTTLES CRITICAL RESULT CALLED TO, READ BACK BY AND VERIFIED WITH: C SHADE,PHARMD@[id number]  05/10/23 MK Performed at [facility], [address], [place], [place] [address]    Culture STAPHYLOCOCCUS EPIDERMIDIS (A)  Final   Report Status 05/12/2023 FINAL  Final   Organism ID, Bacteria STAPHYLOCOCCUS EPIDERMIDIS  Final      Susceptibility   Staphylococcus epidermidis - MIC*    CIPROFLOXACIN  4 RESISTANT Resistant     ERYTHROMYCIN >=8 RESISTANT Resistant     GENTAMICIN  >=16 RESISTANT Resistant     OXACILLIN >=4 RESISTANT Resistant     TETRACYCLINE 2 SENSITIVE Sensitive     VANCOMYCIN  1 SENSITIVE Sensitive     TRIMETH /SULFA  40 SENSITIVE Sensitive     CLINDAMYCIN <=0.25 SENSITIVE Sensitive     RIFAMPIN <=0.5 SENSITIVE Sensitive     Inducible Clindamycin NEGATIVE Sensitive     * STAPHYLOCOCCUS EPIDERMIDIS  Blood Culture ID Panel (Reflexed)     Status: Abnormal   Collection Time: 05/09/23  4:25 PM  Result Value Ref Range Status   Enterococcus faecalis NOT DETECTED NOT DETECTED Final   Enterococcus Faecium NOT DETECTED NOT DETECTED Final   Listeria monocytogenes NOT DETECTED NOT DETECTED Final   Staphylococcus species DETECTED (A) NOT DETECTED Final    Comment: CRITICAL RESULT CALLED TO, READ BACK BY AND VERIFIED WITH: C SHADE,PHARMD@[id number]  05/10/23 MK    Staphylococcus aureus (BCID) NOT DETECTED NOT DETECTED Final   Staphylococcus epidermidis DETECTED (A) NOT DETECTED Final    Comment: Methicillin (oxacillin) resistant coagulase negative staphylococcus. Possible blood culture contaminant (unless isolated from more than one blood culture draw or clinical case suggests pathogenicity). No antibiotic treatment is  indicated for blood  culture contaminants. CRITICAL RESULT CALLED TO, READ BACK BY AND VERIFIED WITH: C SHADE,PHARMD@[id number]  05/10/23 MK    Staphylococcus lugdunensis NOT DETECTED NOT DETECTED Final   Streptococcus species NOT DETECTED NOT DETECTED Final   Streptococcus agalactiae NOT DETECTED NOT DETECTED Final   Streptococcus pneumoniae NOT DETECTED NOT DETECTED Final   Streptococcus pyogenes NOT DETECTED NOT DETECTED Final   A.calcoaceticus-baumannii NOT DETECTED NOT DETECTED Final   Bacteroides fragilis NOT DETECTED NOT DETECTED Final   Enterobacterales NOT DETECTED NOT DETECTED Final   Enterobacter cloacae complex NOT DETECTED NOT DETECTED Final   Escherichia coli NOT DETECTED NOT DETECTED Final   Klebsiella aerogenes NOT DETECTED NOT DETECTED Final   Klebsiella oxytoca NOT DETECTED NOT DETECTED Final   Klebsiella pneumoniae NOT DETECTED NOT DETECTED Final   Proteus species NOT DETECTED NOT DETECTED Final   Salmonella species NOT DETECTED NOT DETECTED Final   Serratia marcescens NOT DETECTED NOT DETECTED Final   Haemophilus influenzae NOT DETECTED NOT DETECTED Final   Neisseria meningitidis NOT DETECTED NOT DETECTED Final   Pseudomonas aeruginosa NOT DETECTED NOT DETECTED Final   Stenotrophomonas maltophilia NOT DETECTED NOT DETECTED Final   Candida albicans NOT DETECTED NOT DETECTED Final   Candida auris NOT DETECTED NOT DETECTED Final   Candida glabrata NOT DETECTED NOT DETECTED Final   Candida krusei NOT DETECTED NOT DETECTED Final  Candida parapsilosis NOT DETECTED NOT DETECTED Final   Candida tropicalis NOT DETECTED NOT DETECTED Final   Cryptococcus neoformans/gattii NOT DETECTED NOT DETECTED Final   Methicillin resistance mecA/C DETECTED (A) NOT DETECTED Final    Comment: CRITICAL RESULT CALLED TO, READ BACK BY AND VERIFIED WITH: C SHADE,PHARMD@[id number]  05/10/23 MK Performed at [facility], [address], [place], [place] [address]   Blood Culture (routine x 2)      Status: Abnormal   Collection Time: 05/09/23  4:40 PM   Specimen: BLOOD  Result Value Ref Range Status   Specimen Description   Final    BLOOD LEFT ANTECUBITAL Performed at [facility], [address] [place], [place] [address]    Special Requests   Final    BOTTLES DRAWN AEROBIC AND ANAEROBIC Blood Culture results may not be optimal due to an inadequate volume of blood received in culture bottles Performed at [facility], [address], [place], [place] [address]    Culture  Setup Time   Final    GRAM POSITIVE COCCI IN CHAINS ANAEROBIC BOTTLE ONLY CRITICAL RESULT CALLED TO, READ BACK BY AND VERIFIED WITH: PHARMD CHRISTINE SHADE ON 05/10/23 @ 1239 BY DRT Performed at [facility], [address] [place], [place] [address]    Culture STREPTOCOCCUS MITIS/ORALIS (A)  Final   Report Status 05/13/2023 FINAL  Final   Organism ID, Bacteria STREPTOCOCCUS MITIS/ORALIS  Final      Susceptibility   Streptococcus mitis/oralis - MIC*    PENICILLIN  <=0.06 SENSITIVE Sensitive     CEFTRIAXONE  <=0.12 SENSITIVE Sensitive     LEVOFLOXACIN  1 SENSITIVE Sensitive     VANCOMYCIN  0.5 SENSITIVE Sensitive     * STREPTOCOCCUS MITIS/ORALIS  Fungus culture, blood     Status: Abnormal   Collection Time: 05/09/23  4:40 PM   Specimen: BLOOD  Result Value Ref Range Status   Specimen Description   Final    BLOOD LEFT ANTECUBITAL Performed at [facility], [address], [place], [place] [address]    Special Requests   Final    NONE Performed at [facility], [address] [place], [place] [address]    Fungal Smear   Final    GRAM POSITIVE COCCI IN CLUSTERS AEROBIC BOTTLE ONLY GRAM POSITIVE COCCI IN CHAINS ANAEROBIC BOTTLE ONLY CRITICAL VALUE NOTED.  VALUE IS CONSISTENT WITH PREVIOUSLY REPORTED AND CALLED VALUE. Performed at [facility], [address], [place], [place] [address]    Culture (A)  Final    STREPTOCOCCUS  MITIS/ORALIS STAPHYLOCOCCUS EPIDERMIDIS    Report Status 05/15/2023 FINAL  Final   Organism ID, Bacteria STREPTOCOCCUS MITIS/ORALIS  Final   Organism ID, Bacteria STAPHYLOCOCCUS EPIDERMIDIS  Final      Susceptibility   Staphylococcus epidermidis - MIC*    CIPROFLOXACIN  4 RESISTANT Resistant     ERYTHROMYCIN >=8 RESISTANT Resistant     GENTAMICIN  <=0.5 SENSITIVE Sensitive     OXACILLIN <=0.25 SENSITIVE Sensitive     TETRACYCLINE 2 SENSITIVE Sensitive     VANCOMYCIN  1 SENSITIVE Sensitive     TRIMETH /SULFA  40 SENSITIVE Sensitive     CLINDAMYCIN <=0.25 SENSITIVE Sensitive     RIFAMPIN <=0.5 SENSITIVE Sensitive     Inducible Clindamycin NEGATIVE Sensitive     * STAPHYLOCOCCUS EPIDERMIDIS   Streptococcus mitis/oralis - MIC*    PENICILLIN  0.12 SENSITIVE Sensitive     CEFTRIAXONE  <=0.12 SENSITIVE Sensitive     LEVOFLOXACIN  1 SENSITIVE  Sensitive     VANCOMYCIN  0.5 SENSITIVE Sensitive     * STREPTOCOCCUS MITIS/ORALIS  Blood Culture ID Panel (Reflexed)     Status: Abnormal   Collection Time: 05/09/23  4:40 PM  Result Value Ref Range Status   Enterococcus faecalis NOT DETECTED NOT DETECTED Final   Enterococcus Faecium NOT DETECTED NOT DETECTED Final   Listeria monocytogenes NOT DETECTED NOT DETECTED Final   Staphylococcus species NOT DETECTED NOT DETECTED Final   Staphylococcus aureus (BCID) NOT DETECTED NOT DETECTED Final   Staphylococcus epidermidis NOT DETECTED NOT DETECTED Final   Staphylococcus lugdunensis NOT DETECTED NOT DETECTED Final   Streptococcus species DETECTED (A) NOT DETECTED Final    Comment: Not Enterococcus species, Streptococcus agalactiae, Streptococcus pyogenes, or Streptococcus pneumoniae. CRITICAL RESULT CALLED TO, READ BACK BY AND VERIFIED WITH: PHARMD CHRISTINE SHADE ON 05/10/23 @ 1239 BY DRT    Streptococcus agalactiae NOT DETECTED NOT DETECTED Final   Streptococcus pneumoniae NOT DETECTED NOT DETECTED Final   Streptococcus pyogenes NOT DETECTED NOT DETECTED  Final   A.calcoaceticus-baumannii NOT DETECTED NOT DETECTED Final   Bacteroides fragilis NOT DETECTED NOT DETECTED Final   Enterobacterales NOT DETECTED NOT DETECTED Final   Enterobacter cloacae complex NOT DETECTED NOT DETECTED Final   Escherichia coli NOT DETECTED NOT DETECTED Final   Klebsiella aerogenes NOT DETECTED NOT DETECTED Final   Klebsiella oxytoca NOT DETECTED NOT DETECTED Final   Klebsiella pneumoniae NOT DETECTED NOT DETECTED Final   Proteus species NOT DETECTED NOT DETECTED Final   Salmonella species NOT DETECTED NOT DETECTED Final   Serratia marcescens NOT DETECTED NOT DETECTED Final   Haemophilus influenzae NOT DETECTED NOT DETECTED Final   Neisseria meningitidis NOT DETECTED NOT DETECTED Final   Pseudomonas aeruginosa NOT DETECTED NOT DETECTED Final   Stenotrophomonas maltophilia NOT DETECTED NOT DETECTED Final   Candida albicans NOT DETECTED NOT DETECTED Final   Candida auris NOT DETECTED NOT DETECTED Final   Candida glabrata NOT DETECTED NOT DETECTED Final   Candida krusei NOT DETECTED NOT DETECTED Final   Candida parapsilosis NOT DETECTED NOT DETECTED Final   Candida tropicalis NOT DETECTED NOT DETECTED Final   Cryptococcus neoformans/gattii NOT DETECTED NOT DETECTED Final    Comment: Performed at [facility], [address] [place], [place] [address]  Resp panel by RT-PCR (RSV, Flu A&B, Covid) Anterior Nasal Swab     Status: Abnormal   Collection Time: 05/09/23 10:45 PM   Specimen: Anterior Nasal Swab  Result Value Ref Range Status   SARS Coronavirus 2 by RT PCR NEGATIVE NEGATIVE Final    Comment: (NOTE) SARS-CoV-2 target nucleic acids are NOT DETECTED.  The SARS-CoV-2 RNA is generally detectable in upper respiratory specimens during the acute phase of infection. The lowest concentration of SARS-CoV-2 viral copies this assay can detect is 138 copies/mL. A negative result does not preclude SARS-Cov-2 infection and should not be used as the sole basis  for treatment or other patient management decisions. A negative result may occur with  improper specimen collection/handling, submission of specimen other than nasopharyngeal swab, presence of viral mutation(s) within the areas targeted by this assay, and inadequate number of viral copies(<138 copies/mL). A negative result must be combined with clinical observations, patient history, and epidemiological information. The expected result is Negative.  Fact Sheet for Patients:  bloggercourse.com  Fact Sheet for Healthcare Providers:  seriousbroker.it  This test is no t yet approved or cleared by the [place]  FDA and  has been authorized for detection and/or diagnosis  of SARS-CoV-2 by FDA under an Emergency Use Authorization (EUA). This EUA will remain  in effect (meaning this test can be used) for the duration of the COVID-19 declaration under Section 564(b)(1) of the Act, 21 U.S.C.section 360bbb-3(b)(1), unless the authorization is terminated  or revoked sooner.       Influenza A by PCR POSITIVE (A) NEGATIVE Final   Influenza B by PCR NEGATIVE NEGATIVE Final    Comment: (NOTE) The Xpert Xpress SARS-CoV-2/FLU/RSV plus assay is intended as an aid in the diagnosis of influenza from Nasopharyngeal swab specimens and should not be used as a sole basis for treatment. Nasal washings and aspirates are unacceptable for Xpert Xpress SARS-CoV-2/FLU/RSV testing.  Fact Sheet for Patients: bloggercourse.com  Fact Sheet for Healthcare Providers: seriousbroker.it  This test is not yet approved or cleared by the [place]  FDA and has been authorized for detection and/or diagnosis of SARS-CoV-2 by FDA under an Emergency Use Authorization (EUA). This EUA will remain in effect (meaning this test can be used) for the duration of the COVID-19 declaration under Section 564(b)(1) of the Act,  21 U.S.C. section 360bbb-3(b)(1), unless the authorization is terminated or revoked.     Resp Syncytial Virus by PCR NEGATIVE NEGATIVE Final    Comment: (NOTE) Fact Sheet for Patients: bloggercourse.com  Fact Sheet for Healthcare Providers: seriousbroker.it  This test is not yet approved or cleared by the [place]  FDA and has been authorized for detection and/or diagnosis of SARS-CoV-2 by FDA under an Emergency Use Authorization (EUA). This EUA will remain in effect (meaning this test can be used) for the duration of the COVID-19 declaration under Section 564(b)(1) of the Act, 21 U.S.C. section 360bbb-3(b)(1), unless the authorization is terminated or revoked.  Performed at [facility], [address] [place], [place] [address]   Gastrointestinal Panel by PCR , Stool     Status: None   Collection Time: 05/09/23 10:45 PM   Specimen: Anterior Nasal Swab; Stool  Result Value Ref Range Status   Campylobacter species NOT DETECTED NOT DETECTED Final   Plesimonas shigelloides NOT DETECTED NOT DETECTED Final   Salmonella species NOT DETECTED NOT DETECTED Final   Yersinia enterocolitica NOT DETECTED NOT DETECTED Final   Vibrio species NOT DETECTED NOT DETECTED Final   Vibrio cholerae NOT DETECTED NOT DETECTED Final   Enteroaggregative E coli (EAEC) NOT DETECTED NOT DETECTED Final   Enteropathogenic E coli (EPEC) NOT DETECTED NOT DETECTED Final   Enterotoxigenic E coli (ETEC) NOT DETECTED NOT DETECTED Final   Shiga like toxin producing E coli (STEC) NOT DETECTED NOT DETECTED Final   Shigella/Enteroinvasive E coli (EIEC) NOT DETECTED NOT DETECTED Final   Cryptosporidium NOT DETECTED NOT DETECTED Final   Cyclospora cayetanensis NOT DETECTED NOT DETECTED Final   Entamoeba histolytica NOT DETECTED NOT DETECTED Final   Giardia lamblia NOT DETECTED NOT DETECTED Final   Adenovirus F40/41 NOT DETECTED NOT DETECTED  Final   Astrovirus NOT DETECTED NOT DETECTED Final   Norovirus GI/GII NOT DETECTED NOT DETECTED Final   Rotavirus A NOT DETECTED NOT DETECTED Final   Sapovirus (I, II, IV, and V) NOT DETECTED NOT DETECTED Final    Comment: Performed at [facility] [place], [address] [place], [place] [address]  MRSA Next Gen by PCR, Nasal     Status: Abnormal   Collection Time: 05/10/23  1:09 AM   Specimen: Nasal Mucosa; Nasal Swab  Result Value Ref Range Status   MRSA by PCR Next Gen DETECTED (A)  NOT DETECTED Final    Comment: (NOTE) The GeneXpert MRSA Assay (FDA approved for NASAL specimens only), is one component of a comprehensive MRSA colonization surveillance program. It is not intended to diagnose MRSA infection nor to guide or monitor treatment for MRSA infections. Test performance is not FDA approved in patients less than 109 years old. Performed at [facility], [address] [place], [place] [address]   Respiratory (~20 pathogens) panel by PCR     Status: Abnormal   Collection Time: 05/10/23 12:46 PM   Specimen: Nasopharyngeal Swab; Respiratory  Result Value Ref Range Status   Adenovirus NOT DETECTED NOT DETECTED Final   Coronavirus 229E NOT DETECTED NOT DETECTED Final    Comment: (NOTE) The Coronavirus on the Respiratory Panel, DOES NOT test for the novel  Coronavirus (2019 nCoV)    Coronavirus HKU1 NOT DETECTED NOT DETECTED Final   Coronavirus NL63 NOT DETECTED NOT DETECTED Final   Coronavirus OC43 NOT DETECTED NOT DETECTED Final   Metapneumovirus NOT DETECTED NOT DETECTED Final   Rhinovirus / Enterovirus NOT DETECTED NOT DETECTED Final   Influenza A H1 2009 DETECTED (A) NOT DETECTED Final   Influenza B NOT DETECTED NOT DETECTED Final   Parainfluenza Virus 1 NOT DETECTED NOT DETECTED Final   Parainfluenza Virus 2 NOT DETECTED NOT DETECTED Final   Parainfluenza Virus 3 NOT DETECTED NOT DETECTED Final   Parainfluenza Virus 4 NOT DETECTED NOT DETECTED  Final   Respiratory Syncytial Virus NOT DETECTED NOT DETECTED Final   Bordetella pertussis NOT DETECTED NOT DETECTED Final   Bordetella Parapertussis NOT DETECTED NOT DETECTED Final   Chlamydophila pneumoniae NOT DETECTED NOT DETECTED Final   Mycoplasma pneumoniae NOT DETECTED NOT DETECTED Final    Comment: Performed at [facility], [address], [place], [place] [address]    Studies/Results: No results found.     Assessment/Plan:  INTERVAL HISTORY: Cultures revealed discordant organisms and her antibacterial antibiotics have been stopped because she does not have a true bacteremia   Principal Problem:   Septic shock (HCC) Active Problems:   Hypotension   On total parenteral nutrition (TPN)   Influenza A   Positive blood culture   Crohn's disease (HCC)   H/O abdominal abscess   Weight loss   Protein-calorie malnutrition, moderate (HCC)    [name] is a 48 y.o. female with a quite complicated past medical history including Crohn's disease with recurrent small bowel obstruction and ischemic bowel who required a partial colectomy this September and a course was complicated by intra-abdominal abscesses requiring drainage and antibiotics and short gut syndrome requiring TPN.  She lost that great deal of weight during this time.  From September to present date.  She had recently had drain removed by interventional radiology for intra-abdominal infection.  Shortly thereafter she began developing flulike symptoms and respiratory symptoms which she believes she had contracted from her son.  She was having fevers chills and rigors and 1 episode of emesis when her home health aide came to see her and noticed that her pulse was in the 160s directed to come to the ER in the ER she was found to be hypotensive initially refractive to IV fluids and albumin  started on vasopressin .  She was found to be positive for influenza A H1 2009 variant.  Blood cultures have been  taken peripherally and also from report--latter should not have been done  CT abdomen and pelvis is benign  #1 Influenza  Sp tamiflu   and droplets  #2 + Blood culture with dual lumen central line: these were contaminated blood cutlures and she does not have a bacteremia or port infection for we have stopped antibiotics.  I have personally spent 50 minutes involved in face-to-face and non-face-to-face activities for this patient on the day of the visit. Professional time spent includes the following activities: Preparing to see the patient (review of tests), Obtaining and/or reviewing separately obtained history (admission/discharge record), Performing a medically appropriate examination and/or evaluation , Ordering medications/tests/procedures, referring and communicating with other health care professionals, Documenting clinical information in the EMR, Independently interpreting results (not separately reported), Communicating results to the patient/family/caregiver, Counseling and educating the patient/family/caregiver and Care coordination (not separately reported).   Evaluation of the patient requires complex antimicrobial therapy evaluation, counseling , isolation needs to reduce disease transmission and risk assessment and mitigation.   I will sign off for now.  Please call with further questions.    LOS: 7 days   Jomarie Fleeta Rothman 05/16/2023, 9:12 AM

## 2023-05-16 NOTE — Progress Notes (Signed)
 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 No obstruction Left-sided ileostomy post colectomy. Small amount of free fluid in the right pelvis is well as adnexa, unchanged from prior exam. Chronic uterine fibroids and  found to be hypotensive unresponsive to IV fluids and albumin  was started on vasopressors now off of it, off since 05/12/2023, tachycardic improved after IV fluids.  She received a dose of hydrocortisone  and midodrine  after she was taken off the pressors for ongoing soft blood pressure. She is being treated with Tamiflu  and daptomycin -and ID has been following. She has a chronic right lower quadrant cutaneous drain which was exchanged on #27 2024 status post ostomy.  Patient today for influenza A Patient was transferred to [facility] 05/13/2023.  ID was following continued on IV antibiotics due to bacteremia-growing in staph epidermidis from right antecubital fossa and staph also from the port. Initially not clear if it was back to bacteremia versus contamination but subsequently staph epidermidis has 2 different sensitivity patterns and per Dr. Fleeta Rothman this is consistent with contamination antibiotic discontinued  Subjective: Seen this morning overall feels better overnight had bump in creatinine given bolus and keeping on IVF She is bit frustrated that she had to stay 1 more night Having colostomy output W/ each oral intake   Assessment and  Plan: Principal Problem:   Septic shock (HCC) Active Problems:   Hypotension   On total parenteral nutrition (TPN)   Influenza A   Positive blood culture   Crohn's disease (HCC)   H/O abdominal abscess   Weight loss   Protein-calorie malnutrition, moderate (HCC)   Influenza A  Doing well currently on room air, completed Tamiflu  course  History of lower lobe subsegmental PE in September 2024: Stable continue home Eliquis   Hypotension Tachycardia ?Relative adrenal insufficiency: Patient did not respond to fluids and needed pressors while in ICU-hypotension likely multifactorial-she has short gut syndrome, influenza A infection.  Chronically on TPN.  Off pressors transition to midodrine  and Solu-Cortef  and transferred out of ICU.she is not on steroids chronically at home-off hydrocortisone .  BP is maintained well, continue midodrine  at lower dose:  ?Bacteremia: Felt to be contamination ?Port-A-Cath infection with Streptococcus submentalis and epidermidis, ID is following likely consistent with contamination of the peripheral stick and colonization of the port.  Managed with empiric daptomycin  patient has had intra-abdominal abscesses in 2024 however current CT does not show any evidence of abscesses. Staph epidermidis has 2 different sensitivity patterns and per Dr. Fleeta Rothman this is consistent with contamination antibiotic discontinued and doing well   AKI: Ceatinine has trended up overnight- gave 1 liter BOLUS, Keeping on ivf ns 125 cc/hr, resume TPN.  Avoid hypotension nephrotoxic medication monitor tonight and repeat BMP in the morning  Moderate malnutrition in the setting of chronic illness Short gut syndrome  On TPN chronically : Currently TPN on hold.  Since no concern for infection okay to resume TPN tonight.   Anemia of chronic disease: HB holding 8.5 g range, monitor  DVT prophylaxis: SCDs Start: 05/09/23 2230 Code Status:  Code Status: Full Code Family Communication:  plan of care discussed with patient/ at bedside. Patient status is: Remains hospitalized because of severity of illness Level of care: Telemetry   Dispo: The patient is from: [facility]            Anticipated disposition: Anticipate discharge home in next 24 hours vital stable   Objective: Vitals last 24 hrs: Vitals:   05/15/23 1537 05/15/23 1926 05/16/23 0450 05/16/23 0559  BP: 103/76 108/87 98/74 109/81  Pulse: (!) 101 84 (!) 108 99  Resp: 16 16 18    Temp: 98.3 F (36.8 C) 98.2 F (36.8 C) 97.6 F (36.4 C)   TempSrc:      SpO2: 100% 100% 100% 100%  Weight:      Height:       Weight change:   Physical Examination: General exam: alert awake, oriented at baseline, older than stated age HEENT:Oral mucosa moist, Ear/Nose WNL grossly Respiratory system: Bilaterally clear BS,no use of accessory muscle Cardiovascular system: S1 & S2 +, No JVD. Gastrointestinal system: Abdomen soft, ostomy in place with stool output  Nervous System: Alert, awake, moving all extremities,and following commands. Extremities: LE edema neg,distal peripheral pulses palpable and warm.  Skin: No rashes,no icterus. MSK: Normal muscle bulk,tone, power   Medications reviewed:  Scheduled Meds:  apixaban   5 mg Oral BID   feeding supplement (KATE FARMS STANDARD 1.4)  325 mL Oral BID BM   midodrine   10 mg Oral TID WC   sodium chloride  flush  10-40 mL Intracatheter Q12H   Continuous Infusions:  sodium chloride  125 mL/hr at 05/16/23 1056     Diet Order             Diet regular Room service appropriate? Yes; Fluid consistency: Thin  Diet effective now                   Nutrition Problem: Moderate Malnutrition Etiology: chronic illness (Crohn's disease with short gut syndrome) Signs/Symptoms: moderate fat depletion, moderate muscle depletion Interventions: Refer to RD note for recommendations, TPN   Intake/Output Summary (Last 24 hours) at 05/16/2023 1233 Last data filed at 05/16/2023 1130 Gross per 24  hour  Intake 130 ml  Output 300 ml  Net -170 ml   Net IO Since Admission: 8,810.85 mL [05/16/23 1233]  Wt Readings from Last 3 Encounters:  05/15/23 42.9 kg  04/26/23 49.4 kg  02/19/23 48.5 kg     Unresulted Labs (From admission, onward)    None     Data Reviewed: I have personally reviewed following labs and imaging studies CBC: Recent Labs  Lab 05/09/23 1625 05/09/23 2245 05/11/23 0645 05/13/23 0649  WBC 19.4* 24.0* 9.5 7.9  NEUTROABS 18.3*  --   --   --   HGB 10.8* 9.3* 8.3* 8.5*  HCT 33.8* 29.1* 26.6* 28.0*  MCV 84.9 85.1 87.5 87.5  PLT 255 258 221 270   Basic Metabolic Panel:  Recent Labs  Lab 05/11/23 0645 05/12/23 1156 05/13/23 0649 05/14/23 0309 05/15/23 0348 05/16/23 0017 05/16/23 1131  NA 134* 138 136 138 138 135 133*  K 3.1* 4.0 3.4* 3.7 3.9 3.8 3.4*  CL 99 100 100 103 100 95* 95*  CO2 28 27 26 26 26 25 25   GLUCOSE 124* 130* 236* 85 84 83 102*  BUN 10 22* 23* 19 26* 45* 48*  CREATININE 0.91 0.80 0.94 0.79 1.07* 1.94* 1.86*  CALCIUM  8.6* 9.4 9.0 9.4 9.8 10.2 9.7  MG 1.7 2.2 2.0 1.9  --   --   --  PHOS 2.9 2.4* 3.6 3.0  --   --   --    GFR: Estimated Creatinine Clearance: 25.9 mL/min (A) (by C-G formula based on SCr of 1.86 mg/dL (H)). Liver Function Tests:  Recent Labs  Lab 05/09/23 1625 05/12/23 1156 05/14/23 0309  AST 88* 28 32  ALT 111* 54* 45*  ALKPHOS 294* 164* 156*  BILITOT 1.4* 0.8 0.7  PROT 8.5* 7.3 7.7  ALBUMIN  3.5 3.1* 3.3*   No results for input(s): LIPASE, AMYLASE in the last 168 hours. No results for input(s): AMMONIA in the last 168 hours. Coagulation Profile:  Recent Labs  Lab 05/09/23 1625  INR 1.2   Recent Labs  Lab 05/13/23 1804 05/14/23 0801 05/14/23 1534 05/14/23 2138 05/15/23 0351  GLUCAP 116* 78 111* 146* 88   Recent Labs    05/14/23 0309  TRIG 195*   No results for input(s): TSH, T4TOTAL, FREET4, T3FREE, THYROIDAB in the last 72 hours. Sepsis Labs: Recent Labs  Lab  05/09/23 1635 05/12/23 1156  PROCALCITON  --  53.99  LATICACIDVEN 1.0  --    Recent Results (from the past 240 hours)  Blood Culture (routine x 2)     Status: Abnormal   Collection Time: 05/09/23  4:25 PM   Specimen: BLOOD  Result Value Ref Range Status   Specimen Description   Final    BLOOD PORTA CATH Performed at [facility], [address] [place], [place] [address]    Special Requests   Final    BOTTLES DRAWN AEROBIC AND ANAEROBIC Blood Culture results may not be optimal due to an inadequate volume of blood received in culture bottles Performed at [facility], [address] [place], [place] [address]    Culture  Setup Time   Final    GRAM POSITIVE COCCI IN BOTH AEROBIC AND ANAEROBIC BOTTLES CRITICAL RESULT CALLED TO, READ BACK BY AND VERIFIED WITH: C SHADE,PHARMD@[id number]  05/10/23 MK Performed at [facility], [address], [place], [place] [address]    Culture STAPHYLOCOCCUS EPIDERMIDIS (A)  Final   Report Status 05/12/2023 FINAL  Final   Organism ID, Bacteria STAPHYLOCOCCUS EPIDERMIDIS  Final      Susceptibility   Staphylococcus epidermidis - MIC*    CIPROFLOXACIN  4 RESISTANT Resistant     ERYTHROMYCIN >=8 RESISTANT Resistant     GENTAMICIN  >=16 RESISTANT Resistant     OXACILLIN >=4 RESISTANT Resistant     TETRACYCLINE 2 SENSITIVE Sensitive     VANCOMYCIN  1 SENSITIVE Sensitive     TRIMETH /SULFA  40 SENSITIVE Sensitive     CLINDAMYCIN <=0.25 SENSITIVE Sensitive     RIFAMPIN <=0.5 SENSITIVE Sensitive     Inducible Clindamycin NEGATIVE Sensitive     * STAPHYLOCOCCUS EPIDERMIDIS  Blood Culture ID Panel (Reflexed)     Status: Abnormal   Collection Time: 05/09/23  4:25 PM  Result Value Ref Range Status   Enterococcus faecalis NOT DETECTED NOT DETECTED Final   Enterococcus Faecium NOT DETECTED NOT DETECTED Final   Listeria monocytogenes NOT DETECTED NOT DETECTED Final   Staphylococcus species DETECTED (A) NOT DETECTED Final     Comment: CRITICAL RESULT CALLED TO, READ BACK BY AND VERIFIED WITH: C SHADE,PHARMD@[id number]  05/10/23 MK    Staphylococcus aureus (BCID) NOT DETECTED NOT DETECTED Final   Staphylococcus epidermidis DETECTED (A) NOT DETECTED Final    Comment: Methicillin (oxacillin) resistant coagulase negative staphylococcus. Possible blood culture contaminant (unless isolated from more than one blood culture draw or clinical case suggests pathogenicity).  No antibiotic treatment is indicated for blood  culture contaminants. CRITICAL RESULT CALLED TO, READ BACK BY AND VERIFIED WITH: C SHADE,PHARMD@[id number]  05/10/23 MK    Staphylococcus lugdunensis NOT DETECTED NOT DETECTED Final   Streptococcus species NOT DETECTED NOT DETECTED Final   Streptococcus agalactiae NOT DETECTED NOT DETECTED Final   Streptococcus pneumoniae NOT DETECTED NOT DETECTED Final   Streptococcus pyogenes NOT DETECTED NOT DETECTED Final   A.calcoaceticus-baumannii NOT DETECTED NOT DETECTED Final   Bacteroides fragilis NOT DETECTED NOT DETECTED Final   Enterobacterales NOT DETECTED NOT DETECTED Final   Enterobacter cloacae complex NOT DETECTED NOT DETECTED Final   Escherichia coli NOT DETECTED NOT DETECTED Final   Klebsiella aerogenes NOT DETECTED NOT DETECTED Final   Klebsiella oxytoca NOT DETECTED NOT DETECTED Final   Klebsiella pneumoniae NOT DETECTED NOT DETECTED Final   Proteus species NOT DETECTED NOT DETECTED Final   Salmonella species NOT DETECTED NOT DETECTED Final   Serratia marcescens NOT DETECTED NOT DETECTED Final   Haemophilus influenzae NOT DETECTED NOT DETECTED Final   Neisseria meningitidis NOT DETECTED NOT DETECTED Final   Pseudomonas aeruginosa NOT DETECTED NOT DETECTED Final   Stenotrophomonas maltophilia NOT DETECTED NOT DETECTED Final   Candida albicans NOT DETECTED NOT DETECTED Final   Candida auris NOT DETECTED NOT DETECTED Final   Candida glabrata NOT DETECTED NOT DETECTED Final   Candida krusei NOT DETECTED NOT  DETECTED Final   Candida parapsilosis NOT DETECTED NOT DETECTED Final   Candida tropicalis NOT DETECTED NOT DETECTED Final   Cryptococcus neoformans/gattii NOT DETECTED NOT DETECTED Final   Methicillin resistance mecA/C DETECTED (A) NOT DETECTED Final    Comment: CRITICAL RESULT CALLED TO, READ BACK BY AND VERIFIED WITH: C SHADE,PHARMD@[id number]  05/10/23 MK Performed at [facility], [address] [place], [place] [address]   Blood Culture (routine x 2)     Status: Abnormal   Collection Time: 05/09/23  4:40 PM   Specimen: BLOOD  Result Value Ref Range Status   Specimen Description   Final    BLOOD LEFT ANTECUBITAL Performed at [facility], [address], [place], [place] [address]    Special Requests   Final    BOTTLES DRAWN AEROBIC AND ANAEROBIC Blood Culture results may not be optimal due to an inadequate volume of blood received in culture bottles Performed at [facility], [address] [place], [place] [address]    Culture  Setup Time   Final    GRAM POSITIVE COCCI IN CHAINS ANAEROBIC BOTTLE ONLY CRITICAL RESULT CALLED TO, READ BACK BY AND VERIFIED WITH: PHARMD CHRISTINE SHADE ON 05/10/23 @ 1239 BY DRT Performed at [facility], [address] [place], [place] [address]    Culture STREPTOCOCCUS MITIS/ORALIS (A)  Final   Report Status 05/13/2023 FINAL  Final   Organism ID, Bacteria STREPTOCOCCUS MITIS/ORALIS  Final      Susceptibility   Streptococcus mitis/oralis - MIC*    PENICILLIN  <=0.06 SENSITIVE Sensitive     CEFTRIAXONE  <=0.12 SENSITIVE Sensitive     LEVOFLOXACIN  1 SENSITIVE Sensitive     VANCOMYCIN  0.5 SENSITIVE Sensitive     * STREPTOCOCCUS MITIS/ORALIS  Fungus culture, blood     Status: Abnormal   Collection Time: 05/09/23  4:40 PM   Specimen: BLOOD  Result Value Ref Range Status   Specimen Description   Final    BLOOD LEFT ANTECUBITAL Performed at [facility], [address], [place], [place]  [address]  Special Requests   Final    NONE Performed at [facility], [address], [place], [place] [address]    Fungal Smear   Final    GRAM POSITIVE COCCI IN CLUSTERS AEROBIC BOTTLE ONLY GRAM POSITIVE COCCI IN CHAINS ANAEROBIC BOTTLE ONLY CRITICAL VALUE NOTED.  VALUE IS CONSISTENT WITH PREVIOUSLY REPORTED AND CALLED VALUE. Performed at [facility], [address], [place], [place] [address]    Culture (A)  Final    STREPTOCOCCUS MITIS/ORALIS STAPHYLOCOCCUS EPIDERMIDIS    Report Status 05/15/2023 FINAL  Final   Organism ID, Bacteria STREPTOCOCCUS MITIS/ORALIS  Final   Organism ID, Bacteria STAPHYLOCOCCUS EPIDERMIDIS  Final      Susceptibility   Staphylococcus epidermidis - MIC*    CIPROFLOXACIN  4 RESISTANT Resistant     ERYTHROMYCIN >=8 RESISTANT Resistant     GENTAMICIN  <=0.5 SENSITIVE Sensitive     OXACILLIN <=0.25 SENSITIVE Sensitive     TETRACYCLINE 2 SENSITIVE Sensitive     VANCOMYCIN  1 SENSITIVE Sensitive     TRIMETH /SULFA  40 SENSITIVE Sensitive     CLINDAMYCIN <=0.25 SENSITIVE Sensitive     RIFAMPIN <=0.5 SENSITIVE Sensitive     Inducible Clindamycin NEGATIVE Sensitive     * STAPHYLOCOCCUS EPIDERMIDIS   Streptococcus mitis/oralis - MIC*    PENICILLIN  0.12 SENSITIVE Sensitive     CEFTRIAXONE  <=0.12 SENSITIVE Sensitive     LEVOFLOXACIN  1 SENSITIVE Sensitive     VANCOMYCIN  0.5 SENSITIVE Sensitive     * STREPTOCOCCUS MITIS/ORALIS  Blood Culture ID Panel (Reflexed)     Status: Abnormal   Collection Time: 05/09/23  4:40 PM  Result Value Ref Range Status   Enterococcus faecalis NOT DETECTED NOT DETECTED Final   Enterococcus Faecium NOT DETECTED NOT DETECTED Final   Listeria monocytogenes NOT DETECTED NOT DETECTED Final   Staphylococcus species NOT DETECTED NOT DETECTED Final   Staphylococcus aureus (BCID) NOT DETECTED NOT DETECTED Final   Staphylococcus epidermidis NOT DETECTED NOT DETECTED Final   Staphylococcus lugdunensis NOT DETECTED  NOT DETECTED Final   Streptococcus species DETECTED (A) NOT DETECTED Final    Comment: Not Enterococcus species, Streptococcus agalactiae, Streptococcus pyogenes, or Streptococcus pneumoniae. CRITICAL RESULT CALLED TO, READ BACK BY AND VERIFIED WITH: PHARMD CHRISTINE SHADE ON 05/10/23 @ 1239 BY DRT    Streptococcus agalactiae NOT DETECTED NOT DETECTED Final   Streptococcus pneumoniae NOT DETECTED NOT DETECTED Final   Streptococcus pyogenes NOT DETECTED NOT DETECTED Final   A.calcoaceticus-baumannii NOT DETECTED NOT DETECTED Final   Bacteroides fragilis NOT DETECTED NOT DETECTED Final   Enterobacterales NOT DETECTED NOT DETECTED Final   Enterobacter cloacae complex NOT DETECTED NOT DETECTED Final   Escherichia coli NOT DETECTED NOT DETECTED Final   Klebsiella aerogenes NOT DETECTED NOT DETECTED Final   Klebsiella oxytoca NOT DETECTED NOT DETECTED Final   Klebsiella pneumoniae NOT DETECTED NOT DETECTED Final   Proteus species NOT DETECTED NOT DETECTED Final   Salmonella species NOT DETECTED NOT DETECTED Final   Serratia marcescens NOT DETECTED NOT DETECTED Final   Haemophilus influenzae NOT DETECTED NOT DETECTED Final   Neisseria meningitidis NOT DETECTED NOT DETECTED Final   Pseudomonas aeruginosa NOT DETECTED NOT DETECTED Final   Stenotrophomonas maltophilia NOT DETECTED NOT DETECTED Final   Candida albicans NOT DETECTED NOT DETECTED Final   Candida auris NOT DETECTED NOT DETECTED Final   Candida glabrata NOT DETECTED NOT DETECTED Final   Candida krusei NOT DETECTED NOT DETECTED Final   Candida parapsilosis NOT DETECTED NOT DETECTED Final  Candida tropicalis NOT DETECTED NOT DETECTED Final   Cryptococcus neoformans/gattii NOT DETECTED NOT DETECTED Final    Comment: Performed at [facility], [address], [place], [place] [address]  Resp panel by RT-PCR (RSV, Flu A&B, Covid) Anterior Nasal Swab     Status: Abnormal   Collection Time: 05/09/23 10:45 PM   Specimen: Anterior  Nasal Swab  Result Value Ref Range Status   SARS Coronavirus 2 by RT PCR NEGATIVE NEGATIVE Final    Comment: (NOTE) SARS-CoV-2 target nucleic acids are NOT DETECTED.  The SARS-CoV-2 RNA is generally detectable in upper respiratory specimens during the acute phase of infection. The lowest concentration of SARS-CoV-2 viral copies this assay can detect is 138 copies/mL. A negative result does not preclude SARS-Cov-2 infection and should not be used as the sole basis for treatment or other patient management decisions. A negative result may occur with  improper specimen collection/handling, submission of specimen other than nasopharyngeal swab, presence of viral mutation(s) within the areas targeted by this assay, and inadequate number of viral copies(<138 copies/mL). A negative result must be combined with clinical observations, patient history, and epidemiological information. The expected result is Negative.  Fact Sheet for Patients:  bloggercourse.com  Fact Sheet for Healthcare Providers:  seriousbroker.it  This test is no t yet approved or cleared by the [place]  FDA and  has been authorized for detection and/or diagnosis of SARS-CoV-2 by FDA under an Emergency Use Authorization (EUA). This EUA will remain  in effect (meaning this test can be used) for the duration of the COVID-19 declaration under Section 564(b)(1) of the Act, 21 U.S.C.section 360bbb-3(b)(1), unless the authorization is terminated  or revoked sooner.       Influenza A by PCR POSITIVE (A) NEGATIVE Final   Influenza B by PCR NEGATIVE NEGATIVE Final    Comment: (NOTE) The Xpert Xpress SARS-CoV-2/FLU/RSV plus assay is intended as an aid in the diagnosis of influenza from Nasopharyngeal swab specimens and should not be used as a sole basis for treatment. Nasal washings and aspirates are unacceptable for Xpert Xpress SARS-CoV-2/FLU/RSV testing.  Fact Sheet  for Patients: bloggercourse.com  Fact Sheet for Healthcare Providers: seriousbroker.it  This test is not yet approved or cleared by the [place]  FDA and has been authorized for detection and/or diagnosis of SARS-CoV-2 by FDA under an Emergency Use Authorization (EUA). This EUA will remain in effect (meaning this test can be used) for the duration of the COVID-19 declaration under Section 564(b)(1) of the Act, 21 U.S.C. section 360bbb-3(b)(1), unless the authorization is terminated or revoked.     Resp Syncytial Virus by PCR NEGATIVE NEGATIVE Final    Comment: (NOTE) Fact Sheet for Patients: bloggercourse.com  Fact Sheet for Healthcare Providers: seriousbroker.it  This test is not yet approved or cleared by the [place]  FDA and has been authorized for detection and/or diagnosis of SARS-CoV-2 by FDA under an Emergency Use Authorization (EUA). This EUA will remain in effect (meaning this test can be used) for the duration of the COVID-19 declaration under Section 564(b)(1) of the Act, 21 U.S.C. section 360bbb-3(b)(1), unless the authorization is terminated or revoked.  Performed at [facility], [address], [place], [place] [address]   Gastrointestinal Panel by PCR , Stool     Status: None   Collection Time: 05/09/23 10:45 PM   Specimen: Anterior Nasal Swab; Stool  Result Value Ref Range Status   Campylobacter species NOT DETECTED NOT DETECTED Final   Plesimonas shigelloides  NOT DETECTED NOT DETECTED Final   Salmonella species NOT DETECTED NOT DETECTED Final   Yersinia enterocolitica NOT DETECTED NOT DETECTED Final   Vibrio species NOT DETECTED NOT DETECTED Final   Vibrio cholerae NOT DETECTED NOT DETECTED Final   Enteroaggregative E coli (EAEC) NOT DETECTED NOT DETECTED Final   Enteropathogenic E coli (EPEC) NOT DETECTED NOT DETECTED Final    Enterotoxigenic E coli (ETEC) NOT DETECTED NOT DETECTED Final   Shiga like toxin producing E coli (STEC) NOT DETECTED NOT DETECTED Final   Shigella/Enteroinvasive E coli (EIEC) NOT DETECTED NOT DETECTED Final   Cryptosporidium NOT DETECTED NOT DETECTED Final   Cyclospora cayetanensis NOT DETECTED NOT DETECTED Final   Entamoeba histolytica NOT DETECTED NOT DETECTED Final   Giardia lamblia NOT DETECTED NOT DETECTED Final   Adenovirus F40/41 NOT DETECTED NOT DETECTED Final   Astrovirus NOT DETECTED NOT DETECTED Final   Norovirus GI/GII NOT DETECTED NOT DETECTED Final   Rotavirus A NOT DETECTED NOT DETECTED Final   Sapovirus (I, II, IV, and V) NOT DETECTED NOT DETECTED Final    Comment: Performed at [facility], [address] [place], [place] [address]  MRSA Next Gen by PCR, Nasal     Status: Abnormal   Collection Time: 05/10/23  1:09 AM   Specimen: Nasal Mucosa; Nasal Swab  Result Value Ref Range Status   MRSA by PCR Next Gen DETECTED (A) NOT DETECTED Final    Comment: (NOTE) The GeneXpert MRSA Assay (FDA approved for NASAL specimens only), is one component of a comprehensive MRSA colonization surveillance program. It is not intended to diagnose MRSA infection nor to guide or monitor treatment for MRSA infections. Test performance is not FDA approved in patients less than 76 years old. Performed at [facility], [address] [place], [place] [address]   Respiratory (~20 pathogens) panel by PCR     Status: Abnormal   Collection Time: 05/10/23 12:46 PM   Specimen: Nasopharyngeal Swab; Respiratory  Result Value Ref Range Status   Adenovirus NOT DETECTED NOT DETECTED Final   Coronavirus 229E NOT DETECTED NOT DETECTED Final    Comment: (NOTE) The Coronavirus on the Respiratory Panel, DOES NOT test for the novel  Coronavirus (2019 nCoV)    Coronavirus HKU1 NOT DETECTED NOT DETECTED Final   Coronavirus NL63 NOT DETECTED NOT DETECTED Final   Coronavirus  OC43 NOT DETECTED NOT DETECTED Final   Metapneumovirus NOT DETECTED NOT DETECTED Final   Rhinovirus / Enterovirus NOT DETECTED NOT DETECTED Final   Influenza A H1 2009 DETECTED (A) NOT DETECTED Final   Influenza B NOT DETECTED NOT DETECTED Final   Parainfluenza Virus 1 NOT DETECTED NOT DETECTED Final   Parainfluenza Virus 2 NOT DETECTED NOT DETECTED Final   Parainfluenza Virus 3 NOT DETECTED NOT DETECTED Final   Parainfluenza Virus 4 NOT DETECTED NOT DETECTED Final   Respiratory Syncytial Virus NOT DETECTED NOT DETECTED Final   Bordetella pertussis NOT DETECTED NOT DETECTED Final   Bordetella Parapertussis NOT DETECTED NOT DETECTED Final   Chlamydophila pneumoniae NOT DETECTED NOT DETECTED Final   Mycoplasma pneumoniae NOT DETECTED NOT DETECTED Final    Comment: Performed at [facility], [address], [place], [place] [address]    Antimicrobials/Microbiology: Anti-infectives (From admission, onward)    Start     Dose/Rate Route Frequency Ordered Stop   05/12/23 2200  oseltamivir  (TAMIFLU ) capsule 75 mg       Placed in Followed by Linked Group   75  mg Oral 2 times daily 05/12/23 1537 05/14/23 2103   05/10/23 2300  fluconazole  (DIFLUCAN ) IVPB 200 mg  Status:  Discontinued       Placed in Followed by Linked Group   200 mg 100 mL/hr over 60 Minutes Intravenous Every 24 hours 05/09/23 2249 05/10/23 1443   05/10/23 2200  oseltamivir  (TAMIFLU ) capsule 30 mg  Status:  Discontinued       Placed in Followed by Linked Group   30 mg Oral 2 times daily 05/10/23 0844 05/12/23 1537   05/10/23 1530  DAPTOmycin  (CUBICIN ) IVPB 500 mg/50mL premix  Status:  Discontinued        500 mg 100 mL/hr over 30 Minutes Intravenous Daily 05/10/23 1443 05/15/23 0821   05/10/23 1000  ceFEPIme  (MAXIPIME ) 2 g in sodium chloride  0.9 % 100 mL IVPB  Status:  Discontinued        2 g 200 mL/hr over 30 Minutes Intravenous Every 12 hours 05/10/23 0503 05/10/23 1443   05/10/23 1000  oseltamivir  (TAMIFLU )  capsule 75 mg  Status:  Discontinued        75 mg Oral 2 times daily 05/10/23 0838 05/10/23 0845   05/10/23 0930  oseltamivir  (TAMIFLU ) capsule 75 mg       Placed in Followed by Linked Group   75 mg Oral  Once 05/10/23 0844 05/10/23 0938   05/10/23 0900  linezolid  (ZYVOX ) IVPB 600 mg  Status:  Discontinued        600 mg 300 mL/hr over 60 Minutes Intravenous Every 12 hours 05/10/23 0758 05/10/23 1443   05/10/23 0600  metroNIDAZOLE  (FLAGYL ) IVPB 500 mg  Status:  Discontinued        500 mg 100 mL/hr over 60 Minutes Intravenous Every 12 hours 05/10/23 0456 05/10/23 1443   05/09/23 2300  piperacillin -tazobactam (ZOSYN ) IVPB 3.375 g  Status:  Discontinued        3.375 g 12.5 mL/hr over 240 Minutes Intravenous Every 8 hours 05/09/23 2244 05/10/23 0456   05/09/23 2300  fluconazole  (DIFLUCAN ) IVPB 400 mg       Placed in Followed by Linked Group   400 mg 100 mL/hr over 120 Minutes Intravenous  Once 05/09/23 2249 05/10/23 0855   05/09/23 2215  ceFEPIme  (MAXIPIME ) 2 g in sodium chloride  0.9 % 100 mL IVPB  Status:  Discontinued        2 g 200 mL/hr over 30 Minutes Intravenous  Once 05/09/23 2214 05/09/23 2237   05/09/23 1715  vancomycin  (VANCOCIN ) IVPB 1000 mg/200 mL premix        1,000 mg 200 mL/hr over 60 Minutes Intravenous  Once 05/09/23 1706 05/09/23 2338   05/09/23 1715  ceFEPIme  (MAXIPIME ) 2 g in sodium chloride  0.9 % 100 mL IVPB  Status:  Discontinued        2 g 200 mL/hr over 30 Minutes Intravenous  Once 05/09/23 1706 05/09/23 2214         Component Value Date/Time   SDES  05/09/2023 1640    BLOOD LEFT ANTECUBITAL Performed at [facility], [address], [place], [place] [address]    SDES  05/09/2023 1640    BLOOD LEFT ANTECUBITAL Performed at [facility], [address] [place], [place] [address]    SPECREQUEST  05/09/2023 1640    BOTTLES DRAWN AEROBIC AND ANAEROBIC Blood Culture results may not be optimal due to an inadequate volume of blood  received in culture bottles Performed at [facility], [address], [place], [place] [address]    SPECREQUEST  05/09/2023 1640    NONE Performed at [facility], [address], [place], [place] [address]    CULT STREPTOCOCCUS MITIS/ORALIS (A) 05/09/2023 1640   CULT (A) 05/09/2023 1640    STREPTOCOCCUS MITIS/ORALIS STAPHYLOCOCCUS EPIDERMIDIS    REPTSTATUS 05/13/2023 FINAL 05/09/2023 1640   REPTSTATUS 05/15/2023 FINAL 05/09/2023 1640     Radiology Studies: No results found.   LOS: 7 days   Total time spent in review of labs and imaging, patient evaluation, formulation of plan, documentation and communication with family: 35 minutes  Mennie LAMY, MD  [facility]  05/16/2023, 12:33 PM

## 2023-05-16 NOTE — Progress Notes (Signed)
 Pharmacy consulted to dose TPN Consult received after TPN compounder cutoff time (12:30p) Possibly discharging tomorrow - will order BMP for tomorrow AM (to which Mg/Phos/LFTs can be added if needed) in case this does not occur  Bard Jeans, PharmD, BCPS [phone]

## 2023-05-17 DIAGNOSIS — K50919 Crohn's disease, unspecified, with unspecified complications: Secondary | ICD-10-CM | POA: Diagnosis not present

## 2023-05-17 DIAGNOSIS — A419 Sepsis, unspecified organism: Secondary | ICD-10-CM | POA: Diagnosis not present

## 2023-05-17 DIAGNOSIS — D72829 Elevated white blood cell count, unspecified: Secondary | ICD-10-CM | POA: Diagnosis not present

## 2023-05-17 DIAGNOSIS — I959 Hypotension, unspecified: Secondary | ICD-10-CM | POA: Diagnosis not present

## 2023-05-17 LAB — COMPREHENSIVE METABOLIC PANEL
ALT: 49 U/L — ABNORMAL HIGH (ref 0–44)
AST: 39 U/L (ref 15–41)
Albumin: 3 g/dL — ABNORMAL LOW (ref 3.5–5.0)
Alkaline Phosphatase: 144 U/L — ABNORMAL HIGH (ref 38–126)
Anion gap: 9 (ref 5–15)
BUN: 42 mg/dL — ABNORMAL HIGH (ref 6–20)
CO2: 23 mmol/L (ref 22–32)
Calcium: 8.4 mg/dL — ABNORMAL LOW (ref 8.9–10.3)
Chloride: 101 mmol/L (ref 98–111)
Creatinine, Ser: 1.25 mg/dL — ABNORMAL HIGH (ref 0.44–1.00)
GFR, Estimated: 54 mL/min — ABNORMAL LOW (ref >60)
Glucose, Bld: 109 mg/dL — ABNORMAL HIGH (ref 70–99)
Potassium: 3.6 mmol/L (ref 3.5–5.1)
Sodium: 133 mmol/L — ABNORMAL LOW (ref 135–145)
Total Bilirubin: 0.8 mg/dL (ref 0.0–1.2)
Total Protein: 7.3 g/dL (ref 6.5–8.1)

## 2023-05-17 LAB — GLUCOSE, CAPILLARY
Glucose-Capillary: 113 mg/dL — ABNORMAL HIGH (ref 70–99)
Glucose-Capillary: 114 mg/dL — ABNORMAL HIGH (ref 70–99)

## 2023-05-17 LAB — PHOSPHORUS: Phosphorus: 3.6 mg/dL (ref 2.5–4.6)

## 2023-05-17 LAB — TRIGLYCERIDES: Triglycerides: 169 mg/dL — ABNORMAL HIGH (ref <150)

## 2023-05-17 LAB — MAGNESIUM: Magnesium: 2 mg/dL (ref 1.7–2.4)

## 2023-05-17 MED ORDER — HEPARIN SOD (PORK) LOCK FLUSH 100 UNIT/ML IV SOLN
250.0000 [IU] | INTRAVENOUS | Status: AC | PRN
  Administered 2023-05-17: 250 [IU]
  Filled 2023-05-17: qty 2.5

## 2023-05-17 MED ORDER — MIDODRINE HCL 10 MG PO TABS: 10.0000 mg | ORAL_TABLET | Freq: Three times a day (TID) | ORAL | 0 refills | Status: AC

## 2023-05-17 MED ORDER — HEPARIN SOD (PORK) LOCK FLUSH 10 UNIT/ML IV SOLN: 10.0000 [IU] | INTRAVENOUS | Status: DC | PRN

## 2023-05-17 NOTE — Discharge Summary (Signed)
 Physician Discharge Summary  [name] FMW:[id number] DOB: 1976-02-04 DOA: 05/09/2023  PCP: [name], MD  Admit date: 05/09/2023 Discharge date: 05/17/2023 Recommendations for Outpatient Follow-up:  Follow up with PCP in 1 weeks-call for appointment Please obtain BMP/CBC in one week  Discharge Dispo: Home with home health Discharge Condition: Stable Code Status:   Code Status: Full Code Diet recommendation:  Diet Order             Diet - low sodium heart healthy           Diet regular Room service appropriate? 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 No obstruction Left-sided ileostomy post colectomy. Small amount of free fluid in the right pelvis is well as adnexa, unchanged from prior exam. Chronic uterine fibroids and  found to be hypotensive unresponsive to IV fluids and albumin  was started on vasopressors now off of it, off since 05/12/2023, tachycardic improved after IV fluids.  She received a dose of hydrocortisone  and midodrine  after she was taken off the pressors for ongoing soft blood pressure. She is being treated with Tamiflu  and daptomycin -and ID has been following. She has a chronic right lower quadrant cutaneous drain which was exchanged on #27 2024 status post ostomy.  Patient today for influenza A Patient was transferred to [facility] 05/13/2023.  ID was following continued on IV antibiotics due to bacteremia-growing in staph epidermidis from right antecubital fossa  and staph also from the port. Initially not clear if it was back to bacteremia versus contamination but subsequently staph epidermidis has 2 different sensitivity patterns and per Dr. Fleeta Rothman this is consistent with contamination antibiotic discontinued She had bump in creatinine so kept additional 1 night with TPN IV fluid and creatinine has improved.  She is very anxious to get discharged for several days now.  At this time stable for discharge , TOC working on to resume home TPN  Discharge Diagnoses:  Principal Problem:   Septic shock (HCC) Active Problems:   Hypotension   On total parenteral nutrition (TPN)   Influenza A   Positive blood culture   Crohn's disease (HCC)   H/O abdominal abscess   Weight loss   Protein-calorie malnutrition, moderate (HCC)   Influenza A  Doing well currently on room air, completed Tamiflu  course  History of lower lobe subsegmental PE in September 2024: Stable continue home Eliquis   Hypotension-chronic since ileostomy Tachycardia ?Relative adrenal insufficiency: Patient did not respond to fluids and needed pressors while in ICU-hypotension likely multifactorial-she has short gut syndrome, influenza A infection.  Chronically on TPN.  Off pressors transition to midodrine  and Solu-Cortef  and transferred out of ICU.she is not on steroids chronically at [facility] hydrocortisone .  BP is maintained well, although soft blood pressure this morning orthostatic vital negative blood pressure holding been 99-100 tens systolic and asymptomatic patient reports she is up to 80s systolic at home asymptomatic.  Home metoprolol  discontinued  ?Bacteremia: Felt to be contamination ?Port-A-Cath infection with Streptococcus submentalis and epidermidis, ID is following  likely consistent with contamination of the peripheral stick and colonization of the port.  Managed with empiric daptomycin  patient has had intra-abdominal abscesses in 2024 however current CT does not show any  evidence of abscesses. Staph epidermidis has 2 different sensitivity patterns and per Dr. Fleeta Rothman this is consistent with contamination antibiotic discontinued and doing well   AKI: Ceatinine trended up overnight- gave 1 liter BOLUS, Kept on TPN and IVF and creatinine improved.    Moderate malnutrition in the setting of chronic illness Short gut syndrome  On TPN chronically : Patient is back on TPN TPN order has been resumed    Anemia of chronic disease: HB holding 8.5 g range, monitor   Consults: Infectious disease Wound care Subjective: She is alert awake, she states she is ready to go home she   Discharge Exam: Vitals:   05/17/23 0907 05/17/23 1323  BP: 110/77 112/79  Pulse: 96 93  Resp:  16  Temp:  97.9 F (36.6 C)  SpO2: 100% 100%   General: Pt is alert, awake, not in acute distress Cardiovascular: RRR, S1/S2 +, no rubs, no gallops Respiratory: CTA bilaterally, no wheezing, no rhonchi Abdominal: Soft, NT, ND, bowel sounds + Extremities: no edema, no cyanosis  Discharge Instructions  Discharge Instructions     Diet - low sodium heart healthy   Complete by: As directed    Discharge instructions   Complete by: As directed    Please call call MD or return to ER for similar or worsening recurring problem that brought you to hospital or if any fever,nausea/vomiting,abdominal pain, uncontrolled pain, chest pain,  shortness of breath or any other alarming symptoms.  Please follow-up your doctor as instructed in a week time and call the office for appointment.  Please avoid alcohol, smoking, or any other illicit substance and maintain healthy habits including taking your regular medications as prescribed.  You were cared for by a hospitalist during your hospital stay. If you have any questions about your discharge medications or the care you received while you were in the hospital after you are discharged, you can call the unit and ask to speak with the hospitalist on  call if the hospitalist that took care of you is not available.  Once you are discharged, your primary care physician will handle any further medical issues. Please note that NO REFILLS for any discharge medications will be authorized once you are discharged, as it is imperative that you return to your primary care physician (or establish a relationship with a primary care physician if you do not have one) for your aftercare needs so that they can reassess your need for medications and monitor your lab values   Increase activity slowly   Complete by: As directed    No wound care   Complete by: As directed       Allergies as of 05/17/2023       Reactions   Nsaids Other (See Comments)   Crohns disease/ IBD   Vancomycin  Itching   Chlorhexidine  Gluconate Itching   Able to use CHG swabs for ports, reaction with direct skin contact   Hydromorphone  Other (See Comments)   Confusion/sedation   Lactose Other (See Comments)   Upset stomach/diarrhea   Lactose Intolerance (gi) Other (See Comments)   Upset stomach/diarrhea        Medication List     STOP taking these medications    fiber Pack packet   gabapentin  250 MG/5ML solution Commonly known as: NEURONTIN   insulin  aspart 100 UNIT/ML injection Commonly known as: novoLOG    metoprolol  tartrate 25 MG tablet Commonly known as: LOPRESSOR    Opium  10 MG/ML (1%) Tinc       TAKE these medications    amoxicillin -clavulanate 875-125 MG tablet Commonly known as: AUGMENTIN  Take 1 tablet by mouth 2 (two) times daily.   apixaban  5 MG Tabs tablet Commonly known as: ELIQUIS  Take 5 mg by mouth 2 (two) times daily. What changed: Another medication with the same name was removed. Continue taking this medication, and follow the directions you see here.   diphenoxylate -atropine  2.5-0.025 MG tablet Commonly known as: LOMOTIL  Take 2 tablets by mouth 4 (four) times daily.   midodrine  10 MG tablet Commonly known as: PROAMATINE  Take 1  tablet (10 mg total) by mouth 3 (three) times daily with meals.   sterile water SOLN with amino acids  10 % SOLN 1.3 g/kg, dextrose  70 % SOLN 20 % Inject into the vein continuous. Infuse TPN 3/1 1500 ml IV through PICC Line Via ambulatory infusion pump over 16 Hour with 1 Hour Ramp Up & 1 Hour Ramp Down 3 X Week (MWF). Add 10 ml MVI Daily Prior To Infusion. Bag Volume: 1550 ml  Base Formula           Amount/Day         Amount/L  Dextrose  70%          175.00 Gm           116.67 Gm Plenamine 15%        80.00 Gm             53.33 Gm SMOFlipid 20%        40.00 Gm             26.67 Gm  Electrolytes  Calcium  Glucona     6.00 meq              4.00 meq Magnesium  Sulf      8.00 meq               5.33 meq Sodium Chloride      230 meq                153.33 meq Potassium Phos     15 mM                    10.00 mM K Acetate                67.00 meq              44.67 meq  Vitamins, trace elements and medications Tralement               1.00 mL                   0.67 mL   Zinc  220 (50 Zn) MG Caps Take 1 capsule by mouth daily.        Follow-up Information     [facility] Follow up.   Contact information: [facility] TPN) [address] [place], [place] [address] [phone]        [name], MD Follow up in 1 week(s).   Specialty: Internal Medicine Contact information: [address] [place] [address] [phone]                Allergies  Allergen Reactions   Nsaids Other (See Comments)    Crohns disease/ IBD   Vancomycin  Itching  Chlorhexidine  Gluconate Itching    Able to use CHG swabs for ports, reaction with direct skin contact   Hydromorphone  Other (See Comments)    Confusion/sedation   Lactose Other (See Comments)    Upset stomach/diarrhea   Lactose Intolerance (Gi) Other (See Comments)    Upset stomach/diarrhea    The results of significant diagnostics from this hospitalization (including imaging, microbiology, ancillary and laboratory)  are listed below for reference.    Microbiology: Recent Results (from the past 240 hours)  Blood Culture (routine x 2)     Status: Abnormal   Collection Time: 05/09/23  4:25 PM   Specimen: BLOOD  Result Value Ref Range Status   Specimen Description   Final    BLOOD PORTA CATH Performed at [facility], [address] [place], [place] [address]    Special Requests   Final    BOTTLES DRAWN AEROBIC AND ANAEROBIC Blood Culture results may not be optimal due to an inadequate volume of blood received in culture bottles Performed at [facility], [address] [place], [place] [address]    Culture  Setup Time   Final    GRAM POSITIVE COCCI IN BOTH AEROBIC AND ANAEROBIC BOTTLES CRITICAL RESULT CALLED TO, READ BACK BY AND VERIFIED WITH: C SHADE,[email or web address]@[id number]  05/10/23 MK Performed at [facility], [address] [place], [place] [address]    Culture STAPHYLOCOCCUS EPIDERMIDIS (A)  Final   Report Status 05/12/2023 FINAL  Final   Organism ID, Bacteria STAPHYLOCOCCUS EPIDERMIDIS  Final      Susceptibility   Staphylococcus epidermidis - MIC*    CIPROFLOXACIN  4 RESISTANT Resistant     ERYTHROMYCIN >=8 RESISTANT Resistant     GENTAMICIN  >=16 RESISTANT Resistant     OXACILLIN >=4 RESISTANT Resistant     TETRACYCLINE 2 SENSITIVE Sensitive     VANCOMYCIN  1 SENSITIVE Sensitive     TRIMETH /SULFA  40 SENSITIVE Sensitive     CLINDAMYCIN <=0.25 SENSITIVE Sensitive     RIFAMPIN <=0.5 SENSITIVE Sensitive     Inducible Clindamycin NEGATIVE Sensitive     * STAPHYLOCOCCUS EPIDERMIDIS  Blood Culture ID Panel (Reflexed)     Status: Abnormal   Collection Time: 05/09/23  4:25 PM  Result Value Ref Range Status   Enterococcus faecalis NOT DETECTED NOT DETECTED Final   Enterococcus Faecium NOT DETECTED NOT DETECTED Final   Listeria monocytogenes NOT DETECTED NOT DETECTED Final   Staphylococcus species DETECTED (A) NOT DETECTED Final    Comment: CRITICAL RESULT  CALLED TO, READ BACK BY AND VERIFIED WITH: C SHADE,PHARMD@[id number]  05/10/23 MK    Staphylococcus aureus (BCID) NOT DETECTED NOT DETECTED Final   Staphylococcus epidermidis DETECTED (A) NOT DETECTED Final    Comment: Methicillin (oxacillin) resistant coagulase negative staphylococcus. Possible blood culture contaminant (unless isolated from more than one blood culture draw or clinical case suggests pathogenicity). No antibiotic treatment is indicated for blood  culture contaminants. CRITICAL RESULT CALLED TO, READ BACK BY AND VERIFIED WITH: C SHADE,[email or web address]@[id number]  05/10/23 MK    Staphylococcus lugdunensis NOT DETECTED NOT DETECTED Final   Streptococcus species NOT DETECTED NOT DETECTED Final   Streptococcus agalactiae NOT DETECTED NOT DETECTED Final   Streptococcus pneumoniae NOT DETECTED NOT DETECTED Final   Streptococcus pyogenes NOT DETECTED NOT DETECTED Final   A.calcoaceticus-baumannii NOT DETECTED NOT DETECTED Final   Bacteroides fragilis NOT DETECTED NOT DETECTED Final   Enterobacterales NOT DETECTED NOT DETECTED Final   Enterobacter cloacae complex NOT DETECTED NOT DETECTED Final   Escherichia coli  NOT DETECTED NOT DETECTED Final   Klebsiella aerogenes NOT DETECTED NOT DETECTED Final   Klebsiella oxytoca NOT DETECTED NOT DETECTED Final   Klebsiella pneumoniae NOT DETECTED NOT DETECTED Final   Proteus species NOT DETECTED NOT DETECTED Final   Salmonella species NOT DETECTED NOT DETECTED Final   Serratia marcescens NOT DETECTED NOT DETECTED Final   Haemophilus influenzae NOT DETECTED NOT DETECTED Final   Neisseria meningitidis NOT DETECTED NOT DETECTED Final   Pseudomonas aeruginosa NOT DETECTED NOT DETECTED Final   Stenotrophomonas maltophilia NOT DETECTED NOT DETECTED Final   Candida albicans NOT DETECTED NOT DETECTED Final   Candida auris NOT DETECTED NOT DETECTED Final   Candida glabrata NOT DETECTED NOT DETECTED Final   Candida krusei NOT DETECTED NOT DETECTED Final   Candida  parapsilosis NOT DETECTED NOT DETECTED Final   Candida tropicalis NOT DETECTED NOT DETECTED Final   Cryptococcus neoformans/gattii NOT DETECTED NOT DETECTED Final   Methicillin resistance mecA/C DETECTED (A) NOT DETECTED Final    Comment: CRITICAL RESULT CALLED TO, READ BACK BY AND VERIFIED WITH: C SHADE,PHARMD@[id number]  05/10/23 MK Performed at [facility], [address] [place], [place] [address]   Blood Culture (routine x 2)     Status: Abnormal   Collection Time: 05/09/23  4:40 PM   Specimen: BLOOD  Result Value Ref Range Status   Specimen Description   Final    BLOOD LEFT ANTECUBITAL Performed at [facility], [address] [place], [place] [address]    Special Requests   Final    BOTTLES DRAWN AEROBIC AND ANAEROBIC Blood Culture results may not be optimal due to an inadequate volume of blood received in culture bottles Performed at [facility], [address], [place], [place] [address]    Culture  Setup Time   Final    GRAM POSITIVE COCCI IN CHAINS ANAEROBIC BOTTLE ONLY CRITICAL RESULT CALLED TO, READ BACK BY AND VERIFIED WITH: PHARMD CHRISTINE SHADE ON 05/10/23 @ 1239 BY DRT Performed at [facility], [address] [place], [place] [address]    Culture STREPTOCOCCUS MITIS/ORALIS (A)  Final   Report Status 05/13/2023 FINAL  Final   Organism ID, Bacteria STREPTOCOCCUS MITIS/ORALIS  Final      Susceptibility   Streptococcus mitis/oralis - MIC*    PENICILLIN  <=0.06 SENSITIVE Sensitive     CEFTRIAXONE  <=0.12 SENSITIVE Sensitive     LEVOFLOXACIN  1 SENSITIVE Sensitive     VANCOMYCIN  0.5 SENSITIVE Sensitive     * STREPTOCOCCUS MITIS/ORALIS  Fungus culture, blood     Status: Abnormal   Collection Time: 05/09/23  4:40 PM   Specimen: BLOOD  Result Value Ref Range Status   Specimen Description   Final    BLOOD LEFT ANTECUBITAL Performed at [facility], [address], [place], [place] [address]    Special Requests    Final    NONE Performed at [facility], [address] [place], [place] [address]    Fungal Smear   Final    GRAM POSITIVE COCCI IN CLUSTERS AEROBIC BOTTLE ONLY GRAM POSITIVE COCCI IN CHAINS ANAEROBIC BOTTLE ONLY CRITICAL VALUE NOTED.  VALUE IS CONSISTENT WITH PREVIOUSLY REPORTED AND CALLED VALUE. Performed at [facility], [address], [place], [place] [address]    Culture (A)  Final    STREPTOCOCCUS MITIS/ORALIS STAPHYLOCOCCUS EPIDERMIDIS    Report Status 05/15/2023 FINAL  Final   Organism ID, Bacteria STREPTOCOCCUS MITIS/ORALIS  Final  Organism ID, Bacteria STAPHYLOCOCCUS EPIDERMIDIS  Final      Susceptibility   Staphylococcus epidermidis - MIC*    CIPROFLOXACIN  4 RESISTANT Resistant     ERYTHROMYCIN >=8 RESISTANT Resistant     GENTAMICIN  <=0.5 SENSITIVE Sensitive     OXACILLIN <=0.25 SENSITIVE Sensitive     TETRACYCLINE 2 SENSITIVE Sensitive     VANCOMYCIN  1 SENSITIVE Sensitive     TRIMETH /SULFA  40 SENSITIVE Sensitive     CLINDAMYCIN <=0.25 SENSITIVE Sensitive     RIFAMPIN <=0.5 SENSITIVE Sensitive     Inducible Clindamycin NEGATIVE Sensitive     * STAPHYLOCOCCUS EPIDERMIDIS   Streptococcus mitis/oralis - MIC*    PENICILLIN  0.12 SENSITIVE Sensitive     CEFTRIAXONE  <=0.12 SENSITIVE Sensitive     LEVOFLOXACIN  1 SENSITIVE Sensitive     VANCOMYCIN  0.5 SENSITIVE Sensitive     * STREPTOCOCCUS MITIS/ORALIS  Blood Culture ID Panel (Reflexed)     Status: Abnormal   Collection Time: 05/09/23  4:40 PM  Result Value Ref Range Status   Enterococcus faecalis NOT DETECTED NOT DETECTED Final   Enterococcus Faecium NOT DETECTED NOT DETECTED Final   Listeria monocytogenes NOT DETECTED NOT DETECTED Final   Staphylococcus species NOT DETECTED NOT DETECTED Final   Staphylococcus aureus (BCID) NOT DETECTED NOT DETECTED Final   Staphylococcus epidermidis NOT DETECTED NOT DETECTED Final   Staphylococcus lugdunensis NOT DETECTED NOT DETECTED Final    Streptococcus species DETECTED (A) NOT DETECTED Final    Comment: Not Enterococcus species, Streptococcus agalactiae, Streptococcus pyogenes, or Streptococcus pneumoniae. CRITICAL RESULT CALLED TO, READ BACK BY AND VERIFIED WITH: PHARMD CHRISTINE SHADE ON 05/10/23 @ 1239 BY DRT    Streptococcus agalactiae NOT DETECTED NOT DETECTED Final   Streptococcus pneumoniae NOT DETECTED NOT DETECTED Final   Streptococcus pyogenes NOT DETECTED NOT DETECTED Final   A.calcoaceticus-baumannii NOT DETECTED NOT DETECTED Final   Bacteroides fragilis NOT DETECTED NOT DETECTED Final   Enterobacterales NOT DETECTED NOT DETECTED Final   Enterobacter cloacae complex NOT DETECTED NOT DETECTED Final   Escherichia coli NOT DETECTED NOT DETECTED Final   Klebsiella aerogenes NOT DETECTED NOT DETECTED Final   Klebsiella oxytoca NOT DETECTED NOT DETECTED Final   Klebsiella pneumoniae NOT DETECTED NOT DETECTED Final   Proteus species NOT DETECTED NOT DETECTED Final   Salmonella species NOT DETECTED NOT DETECTED Final   Serratia marcescens NOT DETECTED NOT DETECTED Final   Haemophilus influenzae NOT DETECTED NOT DETECTED Final   Neisseria meningitidis NOT DETECTED NOT DETECTED Final   Pseudomonas aeruginosa NOT DETECTED NOT DETECTED Final   Stenotrophomonas maltophilia NOT DETECTED NOT DETECTED Final   Candida albicans NOT DETECTED NOT DETECTED Final   Candida auris NOT DETECTED NOT DETECTED Final   Candida glabrata NOT DETECTED NOT DETECTED Final   Candida krusei NOT DETECTED NOT DETECTED Final   Candida parapsilosis NOT DETECTED NOT DETECTED Final   Candida tropicalis NOT DETECTED NOT DETECTED Final   Cryptococcus neoformans/gattii NOT DETECTED NOT DETECTED Final    Comment: Performed at [facility], [address] [place], [place] [address]  Resp panel by RT-PCR (RSV, Flu A&B, Covid) Anterior Nasal Swab     Status: Abnormal   Collection Time: 05/09/23 10:45 PM   Specimen: Anterior Nasal Swab  Result  Value Ref Range Status   SARS Coronavirus 2 by RT PCR NEGATIVE NEGATIVE Final    Comment: (NOTE) SARS-CoV-2 target nucleic acids are NOT DETECTED.  The SARS-CoV-2 RNA is generally detectable in upper respiratory specimens during the acute phase  of infection. The lowest concentration of SARS-CoV-2 viral copies this assay can detect is 138 copies/mL. A negative result does not preclude SARS-Cov-2 infection and should not be used as the sole basis for treatment or other patient management decisions. A negative result may occur with  improper specimen collection/handling, submission of specimen other than nasopharyngeal swab, presence of viral mutation(s) within the areas targeted by this assay, and inadequate number of viral copies(<138 copies/mL). A negative result must be combined with clinical observations, patient history, and epidemiological information. The expected result is Negative.  Fact Sheet for Patients:  bloggercourse.com  Fact Sheet for Healthcare Providers:  seriousbroker.it  This test is no t yet approved or cleared by the [place]  FDA and  has been authorized for detection and/or diagnosis of SARS-CoV-2 by FDA under an Emergency Use Authorization (EUA). This EUA will remain  in effect (meaning this test can be used) for the duration of the COVID-19 declaration under Section 564(b)(1) of the Act, 21 U.S.C.section 360bbb-3(b)(1), unless the authorization is terminated  or revoked sooner.       Influenza A by PCR POSITIVE (A) NEGATIVE Final   Influenza B by PCR NEGATIVE NEGATIVE Final    Comment: (NOTE) The Xpert Xpress SARS-CoV-2/FLU/RSV plus assay is intended as an aid in the diagnosis of influenza from Nasopharyngeal swab specimens and should not be used as a sole basis for treatment. Nasal washings and aspirates are unacceptable for Xpert Xpress SARS-CoV-2/FLU/RSV testing.  Fact Sheet for  Patients: bloggercourse.com  Fact Sheet for Healthcare Providers: seriousbroker.it  This test is not yet approved or cleared by the [place]  FDA and has been authorized for detection and/or diagnosis of SARS-CoV-2 by FDA under an Emergency Use Authorization (EUA). This EUA will remain in effect (meaning this test can be used) for the duration of the COVID-19 declaration under Section 564(b)(1) of the Act, 21 U.S.C. section 360bbb-3(b)(1), unless the authorization is terminated or revoked.     Resp Syncytial Virus by PCR NEGATIVE NEGATIVE Final    Comment: (NOTE) Fact Sheet for Patients: bloggercourse.com  Fact Sheet for Healthcare Providers: seriousbroker.it  This test is not yet approved or cleared by the [place]  FDA and has been authorized for detection and/or diagnosis of SARS-CoV-2 by FDA under an Emergency Use Authorization (EUA). This EUA will remain in effect (meaning this test can be used) for the duration of the COVID-19 declaration under Section 564(b)(1) of the Act, 21 U.S.C. section 360bbb-3(b)(1), unless the authorization is terminated or revoked.  Performed at [facility], [address] [place], [place] [address]   Gastrointestinal Panel by PCR , Stool     Status: None   Collection Time: 05/09/23 10:45 PM   Specimen: Anterior Nasal Swab; Stool  Result Value Ref Range Status   Campylobacter species NOT DETECTED NOT DETECTED Final   Plesimonas shigelloides NOT DETECTED NOT DETECTED Final   Salmonella species NOT DETECTED NOT DETECTED Final   Yersinia enterocolitica NOT DETECTED NOT DETECTED Final   Vibrio species NOT DETECTED NOT DETECTED Final   Vibrio cholerae NOT DETECTED NOT DETECTED Final   Enteroaggregative E coli (EAEC) NOT DETECTED NOT DETECTED Final   Enteropathogenic E coli (EPEC) NOT DETECTED NOT DETECTED Final    Enterotoxigenic E coli (ETEC) NOT DETECTED NOT DETECTED Final   Shiga like toxin producing E coli (STEC) NOT DETECTED NOT DETECTED Final   Shigella/Enteroinvasive E coli (EIEC) NOT DETECTED NOT DETECTED Final   Cryptosporidium NOT DETECTED NOT DETECTED Final  Cyclospora cayetanensis NOT DETECTED NOT DETECTED Final   Entamoeba histolytica NOT DETECTED NOT DETECTED Final   Giardia lamblia NOT DETECTED NOT DETECTED Final   Adenovirus F40/41 NOT DETECTED NOT DETECTED Final   Astrovirus NOT DETECTED NOT DETECTED Final   Norovirus GI/GII NOT DETECTED NOT DETECTED Final   Rotavirus A NOT DETECTED NOT DETECTED Final   Sapovirus (I, II, IV, and V) NOT DETECTED NOT DETECTED Final    Comment: Performed at [facility], [address] [place], [place] [address]  MRSA Next Gen by PCR, Nasal     Status: Abnormal   Collection Time: 05/10/23  1:09 AM   Specimen: Nasal Mucosa; Nasal Swab  Result Value Ref Range Status   MRSA by PCR Next Gen DETECTED (A) NOT DETECTED Final    Comment: (NOTE) The GeneXpert MRSA Assay (FDA approved for NASAL specimens only), is one component of a comprehensive MRSA colonization surveillance program. It is not intended to diagnose MRSA infection nor to guide or monitor treatment for MRSA infections. Test performance is not FDA approved in patients less than 48 years old. Performed at [facility], [address] [place], [place] [address]   Respiratory (~20 pathogens) panel by PCR     Status: Abnormal   Collection Time: 05/10/23 12:46 PM   Specimen: Nasopharyngeal Swab; Respiratory  Result Value Ref Range Status   Adenovirus NOT DETECTED NOT DETECTED Final   Coronavirus 229E NOT DETECTED NOT DETECTED Final    Comment: (NOTE) The Coronavirus on the Respiratory Panel, DOES NOT test for the novel  Coronavirus (2019 nCoV)    Coronavirus HKU1 NOT DETECTED NOT DETECTED Final   Coronavirus NL63 NOT DETECTED NOT DETECTED Final   Coronavirus  OC43 NOT DETECTED NOT DETECTED Final   Metapneumovirus NOT DETECTED NOT DETECTED Final   Rhinovirus / Enterovirus NOT DETECTED NOT DETECTED Final   Influenza A H1 2009 DETECTED (A) NOT DETECTED Final   Influenza B NOT DETECTED NOT DETECTED Final   Parainfluenza Virus 1 NOT DETECTED NOT DETECTED Final   Parainfluenza Virus 2 NOT DETECTED NOT DETECTED Final   Parainfluenza Virus 3 NOT DETECTED NOT DETECTED Final   Parainfluenza Virus 4 NOT DETECTED NOT DETECTED Final   Respiratory Syncytial Virus NOT DETECTED NOT DETECTED Final   Bordetella pertussis NOT DETECTED NOT DETECTED Final   Bordetella Parapertussis NOT DETECTED NOT DETECTED Final   Chlamydophila pneumoniae NOT DETECTED NOT DETECTED Final   Mycoplasma pneumoniae NOT DETECTED NOT DETECTED Final    Comment: Performed at [facility], [address], [place], [place] [address]    Procedures/Studies: ECHOCARDIOGRAM COMPLETE Result Date: 05/11/2023    ECHOCARDIOGRAM REPORT   Patient Name:   [name] Date of Exam: 05/11/2023 Medical Rec #:  [id number]        Height:       64.0 in Accession #:    [id number]       Weight:       104.7 lb Date of Birth:  1976-01-09        BSA:          1.486 m Patient Age:    47 years         BP:           99/70 mmHg Patient Gender: F                HR:           60 bpm. Exam Location:  Inpatient  Procedure: 2D Echo, Cardiac Doppler and Color Doppler Indications:    Bacteremia  History:        Patient has prior history of Echocardiogram examinations, most                 recent 12/20/2022. Prior CABG, Arrythmias:Cardiac Arrest; Risk                 Factors:Former Smoker. FLU positive. Breast cancer. Septic                 shock.  Sonographer:    Ellouise Mose RDCS Referring Phys: [id number] DORN KATHEE CHILL  Sonographer Comments: Technically difficult study due to poor echo windows and no apical window. Image acquisition challenging due to breast implants. IMPRESSIONS  1. Left ventricular ejection fraction, by  estimation, is 60 to 65%. The left ventricle has normal function. The left ventricle has no regional wall motion abnormalities. Left ventricular diastolic parameters were normal.  2. Right ventricular systolic function is normal. The right ventricular size is normal. There is normal pulmonary artery systolic pressure. The estimated right ventricular systolic pressure is 27.7 mmHg.  3. The mitral valve is normal in structure. No evidence of mitral valve regurgitation. No evidence of mitral stenosis.  4. The aortic valve is tricuspid. Aortic valve regurgitation is not visualized. No aortic stenosis is present.  5. The inferior vena cava is normal in size with <50% respiratory variability, suggesting right atrial pressure of 8 mmHg. FINDINGS  Left Ventricle: Left ventricular ejection fraction, by estimation, is 60 to 65%. The left ventricle has normal function. The left ventricle has no regional wall motion abnormalities. The left ventricular internal cavity size was normal in size. There is  no left ventricular hypertrophy. Left ventricular diastolic parameters were normal. Right Ventricle: The right ventricular size is normal. No increase in right ventricular wall thickness. Right ventricular systolic function is normal. There is normal pulmonary artery systolic pressure. The tricuspid regurgitant velocity is 2.22 m/s, and  with an assumed right atrial pressure of 8 mmHg, the estimated right ventricular systolic pressure is 27.7 mmHg. Left Atrium: Left atrial size was normal in size. Right Atrium: Right atrial size was normal in size. Pericardium: There is no evidence of pericardial effusion. Mitral Valve: The mitral valve is normal in structure. No evidence of mitral valve regurgitation. No evidence of mitral valve stenosis. Tricuspid Valve: The tricuspid valve is normal in structure. Tricuspid valve regurgitation is mild. Aortic Valve: The aortic valve is tricuspid. Aortic valve regurgitation is not visualized. No  aortic stenosis is present. Pulmonic Valve: The pulmonic valve was normal in structure. Pulmonic valve regurgitation is not visualized. Aorta: The aortic root is normal in size and structure. Venous: The inferior vena cava is normal in size with less than 50% respiratory variability, suggesting right atrial pressure of 8 mmHg. IAS/Shunts: No atrial level shunt detected by color flow Doppler.  LEFT VENTRICLE PLAX 2D LVIDd:         3.80 cm     Diastology LVIDs:         2.40 cm     LV e' medial:    7.62 cm/s LV PW:         0.70 cm     LV E/e' medial:  6.2 LV IVS:        0.80 cm     LV e' lateral:   10.00 cm/s LVOT diam:     1.80 cm     LV E/e' lateral: 4.8  LV SV:         38 LV SV Index:   25 LVOT Area:     2.54 cm  LV Volumes (MOD) LV vol d, MOD A4C: 64.5 ml LV vol s, MOD A4C: 26.7 ml LV SV MOD A4C:     64.5 ml RIGHT VENTRICLE             IVC RV S prime:     13.10 cm/s  IVC diam: 1.50 cm TAPSE (M-mode): 2.1 cm LEFT ATRIUM           Index        RIGHT ATRIUM          Index LA diam:      2.00 cm 1.35 cm/m   RA Area:     9.21 cm LA Vol (A4C): 22.5 ml 15.14 ml/m  RA Volume:   18.40 ml 12.38 ml/m  AORTIC VALVE LVOT Vmax:   71.60 cm/s LVOT Vmean:  42.400 cm/s LVOT VTI:    0.148 m  AORTA Ao Root diam: 2.90 cm MITRAL VALVE               TRICUSPID VALVE MV Area (PHT): 2.73 cm    TR Peak grad:   19.7 mmHg MV Decel Time: 278 msec    TR Vmax:        222.00 cm/s MV E velocity: 47.60 cm/s MV A velocity: 41.10 cm/s  SHUNTS MV E/A ratio:  1.16        Systemic VTI:  0.15 m                            Systemic Diam: 1.80 cm Dalton McleanMD Electronically signed by Ezra Kanner Signature Date/Time: 05/11/2023/8:52:09 AM    Final    CT ABDOMEN PELVIS W CONTRAST Result Date: 05/09/2023 CLINICAL DATA:  Provided history of sepsis. Patient reports weakness and chills. Radiologic records indicates history of metastatic breast cancer status post partial colectomy. EXAM: CT ABDOMEN AND PELVIS WITH CONTRAST TECHNIQUE: Multidetector CT  imaging of the abdomen and pelvis was performed using the standard protocol following bolus administration of intravenous contrast. RADIATION DOSE REDUCTION: This exam was performed according to the departmental dose-optimization program which includes automated exposure control, adjustment of the mA and/or kV according to patient size and/or use of iterative reconstruction technique. CONTRAST:  OMNIPAQUE  IOHEXOL  300 MG/ML  SOLN COMPARISON:  CT 04/13/2023, abscess injection 2 days ago reviewed FINDINGS: Lower chest: Scarring at the left lung base. Hepatobiliary: No focal liver abnormality. Possible small gallstone series 2, image 30. No abnormal gallbladder distention or pericholecystic inflammation. Pancreas: No ductal dilatation or inflammation. Spleen: Normal in size without focal abnormality. Adrenals/Urinary Tract: No adrenal nodule. Slight bilateral renal collecting system prominence, left greater than right. Equivocal enhancement of the left renal pelvis. No renal calculi. Unremarkable urinary bladder, no bladder wall thickening. Stomach/Bowel: Bowel assessment is limited in the absence of enteric contrast and paucity of intra-abdominal fat. Decompressed stomach. Subtotal colectomy. Small amount of fluid within the rectum, previous contrast in the rectum has cleared. Left abdominal ileostomy. Suggestion of mild diffuse small bowel wall thickening. No bowel pneumatosis. No abnormal distension or obstruction. Vascular/Lymphatic: Normal caliber abdominal aorta. Patent portal and splenic veins. Narrowing of the left renal vein as it courses under the SMA is again seen. No gross abdominopelvic adenopathy. Reproductive: Again seen uterine fibroids. Small amount of free fluid in the right adnexa/pelvis is unchanged from prior  exam. Other: Small amount of stranding in the right mid abdomen, similar to prior. Small amount of free fluid in the right pelvis and adnexa which was present on prior exam. No free air.  Musculoskeletal: No free air, free fluid, or intra-abdominal fluid collection. Left breast implant. Right mastectomy. IMPRESSION: 1. Suggestion of mild diffuse small bowel wall thickening which can be seen with enteritis. No obstruction. 2. Left-sided ileostomy post colectomy. 3. Small amount of free fluid in the right pelvis is well as adnexa, unchanged from prior exam. 4. Chronic uterine fibroids. Electronically Signed   By: Andrea Gasman M.D.   On: 05/09/2023 21:40   DG Chest Port 1 View Result Date: 05/09/2023 CLINICAL DATA:  Hypertension, weakness, chills with nausea and vomiting, concern for sepsis EXAM: PORTABLE CHEST 1 VIEW COMPARISON:  02/11/2023 FINDINGS: Normal heart size and vascularity. Left breast implant noted over the left hemithorax. Lungs remain clear. No focal pneumonia, collapse or consolidation. Negative for edema, large effusion or pneumothorax. Trachea midline. Right axillary clips present. Right IJ tunneled PICC line tip SVC RA junction. Overall stable exam. IMPRESSION: No acute chest process. Electronically Signed   By: CHRISTELLA.  Shick M.D.   On: 05/09/2023 16:45   IR Radiologist Eval & Mgmt Result Date: 05/07/2023 EXAM: ESTABLISHED PATIENT OFFICE VISIT CHIEF COMPLAINT: See Epic note. HISTORY OF PRESENT ILLNESS: See Epic note. REVIEW OF SYSTEMS: See Epic note. PHYSICAL EXAMINATION: See Epic note. ASSESSMENT AND PLAN: See Epic note. Ester Sides, MD [facility] Electronically Signed   By: Ester Sides M.D.   On: 05/07/2023 12:42   DG Sinus/Fist Tube Chk-Non GI Result Date: 05/07/2023 CLINICAL DATA:  49 year old with history of metastatic breast cancer status post partial colectomy complicated postoperatively by intra-abdominal fluid collection status post percutaneous drain placement on 01/11/2023. 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 Drain clinic follow up. EXAM: ABSCESS INJECTION COMPARISON:   04/13/2023 CONTRAST:  10 mL on the 300-administered via the existing percutaneous drain. FLUOROSCOPY TIME:  0.7 mGy TECHNIQUE: The patient was positioned supine on the fluoroscopy table. A preprocedural spot fluoroscopic image was obtained of the pelvis and the existing percutaneous drainage catheter. Multiple spot fluoroscopic and radiographic images were obtained following the injection of a small amount of contrast via the existing percutaneous drainage catheter. The external portion of the drain was cut to release and pigtail over the drain was removed successfully. Sterile bandage was applied. FINDINGS: No evidence of residual fluid collection or enteric abscess. IMPRESSION: Interval resolution of previously visualized rectal fistula. No significant residual pelvic fluid collection. The drain was removed. Ester Sides, MD [facility] Electronically Signed   By: Ester Sides M.D.   On: 05/07/2023 12:40   IR PATIENT EVAL TECH 0-60 MINS Result Date: 04/24/2023 [name]     04/25/2023 11:12 AM Patient presented to IR with pain from Stat Lock device.  Stat Lock was removed with adhesive remover, site cleaned with alcohol swab and Cinch stabilizing device was applied and redressed in a sterile fashion.  Educated patient on how to place cinch and to call IR department with any questions/ concerns she may have with the drain.   Jasmine Wolfe RT   Labs: BNP (last 3 results) No results for input(s): BNP in the last 8760 hours. Basic Metabolic Panel: Recent Labs  Lab 05/11/23 0645 05/12/23 1156 05/13/23 0649 05/14/23 0309 05/15/23 0348 05/16/23 0017 05/16/23 1131 05/17/23 0130  NA 134* 138 136 138  138 135 133* 133*  K 3.1* 4.0 3.4* 3.7 3.9 3.8 3.4* 3.6  CL 99 100 100 103 100 95* 95* 101  CO2 28 27 26 26 26 25 25 23   GLUCOSE 124* 130* 236* 85 84 83 102* 109*  BUN 10 22* 23* 19 26* 45* 48* 42*  CREATININE 0.91 0.80 0.94 0.79  1.07* 1.94* 1.86* 1.25*  CALCIUM  8.6* 9.4 9.0 9.4 9.8 10.2 9.7 8.4*  MG 1.7 2.2 2.0 1.9  --   --   --  2.0  PHOS 2.9 2.4* 3.6 3.0  --   --   --  3.6   Liver Function Tests: Recent Labs  Lab 05/12/23 1156 05/14/23 0309 05/17/23 0130  AST 28 32 39  ALT 54* 45* 49*  ALKPHOS 164* 156* 144*  BILITOT 0.8 0.7 0.8  PROT 7.3 7.7 7.3  ALBUMIN  3.1* 3.3* 3.0*   No results for input(s): LIPASE, AMYLASE in the last 168 hours. No results for input(s): AMMONIA in the last 168 hours. CBC: Recent Labs  Lab 05/11/23 0645 05/13/23 0649  WBC 9.5 7.9  HGB 8.3* 8.5*  HCT 26.6* 28.0*  MCV 87.5 87.5  PLT 221 270  Cardiac Enzymes: Recent Labs  Lab 05/11/23 0645  CKTOTAL <5*  BNP: Invalid input(s): POCBNP CBG: Recent Labs  Lab 05/14/23 2138 05/15/23 0351 05/16/23 2045 05/17/23 0558 05/17/23 0832  GLUCAP 146* 88 170* 113* 114*  Lipid Profile Recent Labs    05/17/23 0130  TRIG 169*  Thyroid  function studies No results for input(s): TSH, T4TOTAL, T3FREE, THYROIDAB in the last 72 hours.  Invalid input(s): FREET3 Anemia work up No results for input(s): VITAMINB12, FOLATE, FERRITIN, TIBC, IRON , RETICCTPCT in the last 72 hours. Urinalysis    Component Value Date/Time   COLORURINE YELLOW 05/09/2023 2039   APPEARANCEUR CLEAR 05/09/2023 2039   LABSPEC 1.013 05/09/2023 2039   PHURINE 6.0 05/09/2023 2039   GLUCOSEU NEGATIVE 05/09/2023 2039   HGBUR NEGATIVE 05/09/2023 2039   BILIRUBINUR NEGATIVE 05/09/2023 2039   KETONESUR NEGATIVE 05/09/2023 2039   PROTEINUR NEGATIVE 05/09/2023 2039   UROBILINOGEN 0.2 06/24/2007 0321   NITRITE NEGATIVE 05/09/2023 2039   LEUKOCYTESUR NEGATIVE 05/09/2023 2039  Sepsis Labs Recent Labs  Lab 05/11/23 0645 05/13/23 0649  WBC 9.5 7.9   Microbiology Recent Results (from the past 240 hours)  Blood Culture (routine x 2)     Status: Abnormal   Collection Time: 05/09/23  4:25 PM   Specimen: BLOOD  Result Value Ref Range  Status   Specimen Description   Final    BLOOD PORTA CATH Performed at [facility], [address] [place], [place] [address]    Special Requests   Final    BOTTLES DRAWN AEROBIC AND ANAEROBIC Blood Culture results may not be optimal due to an inadequate volume of blood received in culture bottles Performed at [facility], [address] [place], [place] [address]    Culture  Setup Time   Final    GRAM POSITIVE COCCI IN BOTH AEROBIC AND ANAEROBIC BOTTLES CRITICAL RESULT CALLED TO, READ BACK BY AND VERIFIED WITH: C SHADE,PHARMD@[id number]  05/10/23 MK Performed at [facility], [address] [place], [place] [address]    Culture STAPHYLOCOCCUS EPIDERMIDIS (A)  Final   Report Status 05/12/2023 FINAL  Final   Organism ID, Bacteria STAPHYLOCOCCUS EPIDERMIDIS  Final      Susceptibility   Staphylococcus epidermidis - MIC*    CIPROFLOXACIN  4 RESISTANT Resistant  ERYTHROMYCIN >=8 RESISTANT Resistant     GENTAMICIN  >=16 RESISTANT Resistant     OXACILLIN >=4 RESISTANT Resistant     TETRACYCLINE 2 SENSITIVE Sensitive     VANCOMYCIN  1 SENSITIVE Sensitive     TRIMETH /SULFA  40 SENSITIVE Sensitive     CLINDAMYCIN <=0.25 SENSITIVE Sensitive     RIFAMPIN <=0.5 SENSITIVE Sensitive     Inducible Clindamycin NEGATIVE Sensitive     * STAPHYLOCOCCUS EPIDERMIDIS  Blood Culture ID Panel (Reflexed)     Status: Abnormal   Collection Time: 05/09/23  4:25 PM  Result Value Ref Range Status   Enterococcus faecalis NOT DETECTED NOT DETECTED Final   Enterococcus Faecium NOT DETECTED NOT DETECTED Final   Listeria monocytogenes NOT DETECTED NOT DETECTED Final   Staphylococcus species DETECTED (A) NOT DETECTED Final    Comment: CRITICAL RESULT CALLED TO, READ BACK BY AND VERIFIED WITH: C SHADE,PHARMD@[id number]  05/10/23 MK    Staphylococcus aureus (BCID) NOT DETECTED NOT DETECTED Final   Staphylococcus epidermidis DETECTED (A) NOT DETECTED Final    Comment: Methicillin  (oxacillin) resistant coagulase negative staphylococcus. Possible blood culture contaminant (unless isolated from more than one blood culture draw or clinical case suggests pathogenicity). No antibiotic treatment is indicated for blood  culture contaminants. CRITICAL RESULT CALLED TO, READ BACK BY AND VERIFIED WITH: C SHADE,PHARMD@[id number]  05/10/23 MK    Staphylococcus lugdunensis NOT DETECTED NOT DETECTED Final   Streptococcus species NOT DETECTED NOT DETECTED Final   Streptococcus agalactiae NOT DETECTED NOT DETECTED Final   Streptococcus pneumoniae NOT DETECTED NOT DETECTED Final   Streptococcus pyogenes NOT DETECTED NOT DETECTED Final   A.calcoaceticus-baumannii NOT DETECTED NOT DETECTED Final   Bacteroides fragilis NOT DETECTED NOT DETECTED Final   Enterobacterales NOT DETECTED NOT DETECTED Final   Enterobacter cloacae complex NOT DETECTED NOT DETECTED Final   Escherichia coli NOT DETECTED NOT DETECTED Final   Klebsiella aerogenes NOT DETECTED NOT DETECTED Final   Klebsiella oxytoca NOT DETECTED NOT DETECTED Final   Klebsiella pneumoniae NOT DETECTED NOT DETECTED Final   Proteus species NOT DETECTED NOT DETECTED Final   Salmonella species NOT DETECTED NOT DETECTED Final   Serratia marcescens NOT DETECTED NOT DETECTED Final   Haemophilus influenzae NOT DETECTED NOT DETECTED Final   Neisseria meningitidis NOT DETECTED NOT DETECTED Final   Pseudomonas aeruginosa NOT DETECTED NOT DETECTED Final   Stenotrophomonas maltophilia NOT DETECTED NOT DETECTED Final   Candida albicans NOT DETECTED NOT DETECTED Final   Candida auris NOT DETECTED NOT DETECTED Final   Candida glabrata NOT DETECTED NOT DETECTED Final   Candida krusei NOT DETECTED NOT DETECTED Final   Candida parapsilosis NOT DETECTED NOT DETECTED Final   Candida tropicalis NOT DETECTED NOT DETECTED Final   Cryptococcus neoformans/gattii NOT DETECTED NOT DETECTED Final   Methicillin resistance mecA/C DETECTED (A) NOT DETECTED Final     Comment: CRITICAL RESULT CALLED TO, READ BACK BY AND VERIFIED WITH: C SHADE,PHARMD@[id number]  05/10/23 MK Performed at [facility], [address] [place], [place] [address]   Blood Culture (routine x 2)     Status: Abnormal   Collection Time: 05/09/23  4:40 PM   Specimen: BLOOD  Result Value Ref Range Status   Specimen Description   Final    BLOOD LEFT ANTECUBITAL Performed at [facility], [address] [place], [place] [address]    Special Requests   Final    BOTTLES DRAWN AEROBIC AND ANAEROBIC Blood Culture results may not be optimal due to an inadequate volume of blood  received in culture bottles Performed at [facility], [address] [place], [place] [address]    Culture  Setup Time   Final    GRAM POSITIVE COCCI IN CHAINS ANAEROBIC BOTTLE ONLY CRITICAL RESULT CALLED TO, READ BACK BY AND VERIFIED WITH: PHARMD CHRISTINE SHADE ON 05/10/23 @ 1239 BY DRT Performed at [facility], [address], [place], [place] [address]    Culture STREPTOCOCCUS MITIS/ORALIS (A)  Final   Report Status 05/13/2023 FINAL  Final   Organism ID, Bacteria STREPTOCOCCUS MITIS/ORALIS  Final      Susceptibility   Streptococcus mitis/oralis - MIC*    PENICILLIN  <=0.06 SENSITIVE Sensitive     CEFTRIAXONE  <=0.12 SENSITIVE Sensitive     LEVOFLOXACIN  1 SENSITIVE Sensitive     VANCOMYCIN  0.5 SENSITIVE Sensitive     * STREPTOCOCCUS MITIS/ORALIS  Fungus culture, blood     Status: Abnormal   Collection Time: 05/09/23  4:40 PM   Specimen: BLOOD  Result Value Ref Range Status   Specimen Description   Final    BLOOD LEFT ANTECUBITAL Performed at [facility], [address] [place], [place] [address]    Special Requests   Final    NONE Performed at [facility], [address], [place], [place] [address]    Fungal Smear   Final    GRAM POSITIVE COCCI IN CLUSTERS AEROBIC BOTTLE ONLY GRAM POSITIVE COCCI IN CHAINS ANAEROBIC BOTTLE  ONLY CRITICAL VALUE NOTED.  VALUE IS CONSISTENT WITH PREVIOUSLY REPORTED AND CALLED VALUE. Performed at [facility], [address], [place], [place] [address]    Culture (A)  Final    STREPTOCOCCUS MITIS/ORALIS STAPHYLOCOCCUS EPIDERMIDIS    Report Status 05/15/2023 FINAL  Final   Organism ID, Bacteria STREPTOCOCCUS MITIS/ORALIS  Final   Organism ID, Bacteria STAPHYLOCOCCUS EPIDERMIDIS  Final      Susceptibility   Staphylococcus epidermidis - MIC*    CIPROFLOXACIN  4 RESISTANT Resistant     ERYTHROMYCIN >=8 RESISTANT Resistant     GENTAMICIN  <=0.5 SENSITIVE Sensitive     OXACILLIN <=0.25 SENSITIVE Sensitive     TETRACYCLINE 2 SENSITIVE Sensitive     VANCOMYCIN  1 SENSITIVE Sensitive     TRIMETH /SULFA  40 SENSITIVE Sensitive     CLINDAMYCIN <=0.25 SENSITIVE Sensitive     RIFAMPIN <=0.5 SENSITIVE Sensitive     Inducible Clindamycin NEGATIVE Sensitive     * STAPHYLOCOCCUS EPIDERMIDIS   Streptococcus mitis/oralis - MIC*    PENICILLIN  0.12 SENSITIVE Sensitive     CEFTRIAXONE  <=0.12 SENSITIVE Sensitive     LEVOFLOXACIN  1 SENSITIVE Sensitive     VANCOMYCIN  0.5 SENSITIVE Sensitive     * STREPTOCOCCUS MITIS/ORALIS  Blood Culture ID Panel (Reflexed)     Status: Abnormal   Collection Time: 05/09/23  4:40 PM  Result Value Ref Range Status   Enterococcus faecalis NOT DETECTED NOT DETECTED Final   Enterococcus Faecium NOT DETECTED NOT DETECTED Final   Listeria monocytogenes NOT DETECTED NOT DETECTED Final   Staphylococcus species NOT DETECTED NOT DETECTED Final   Staphylococcus aureus (BCID) NOT DETECTED NOT DETECTED Final   Staphylococcus epidermidis NOT DETECTED NOT DETECTED Final   Staphylococcus lugdunensis NOT DETECTED NOT DETECTED Final   Streptococcus species DETECTED (A) NOT DETECTED Final    Comment: Not Enterococcus species, Streptococcus agalactiae, Streptococcus pyogenes, or Streptococcus pneumoniae. CRITICAL RESULT CALLED TO, READ BACK BY AND VERIFIED WITH: PHARMD  CHRISTINE SHADE ON 05/10/23 @ 1239 BY DRT    Streptococcus agalactiae  NOT DETECTED NOT DETECTED Final   Streptococcus pneumoniae NOT DETECTED NOT DETECTED Final   Streptococcus pyogenes NOT DETECTED NOT DETECTED Final   A.calcoaceticus-baumannii NOT DETECTED NOT DETECTED Final   Bacteroides fragilis NOT DETECTED NOT DETECTED Final   Enterobacterales NOT DETECTED NOT DETECTED Final   Enterobacter cloacae complex NOT DETECTED NOT DETECTED Final   Escherichia coli NOT DETECTED NOT DETECTED Final   Klebsiella aerogenes NOT DETECTED NOT DETECTED Final   Klebsiella oxytoca NOT DETECTED NOT DETECTED Final   Klebsiella pneumoniae NOT DETECTED NOT DETECTED Final   Proteus species NOT DETECTED NOT DETECTED Final   Salmonella species NOT DETECTED NOT DETECTED Final   Serratia marcescens NOT DETECTED NOT DETECTED Final   Haemophilus influenzae NOT DETECTED NOT DETECTED Final   Neisseria meningitidis NOT DETECTED NOT DETECTED Final   Pseudomonas aeruginosa NOT DETECTED NOT DETECTED Final   Stenotrophomonas maltophilia NOT DETECTED NOT DETECTED Final   Candida albicans NOT DETECTED NOT DETECTED Final   Candida auris NOT DETECTED NOT DETECTED Final   Candida glabrata NOT DETECTED NOT DETECTED Final   Candida krusei NOT DETECTED NOT DETECTED Final   Candida parapsilosis NOT DETECTED NOT DETECTED Final   Candida tropicalis NOT DETECTED NOT DETECTED Final   Cryptococcus neoformans/gattii NOT DETECTED NOT DETECTED Final    Comment: Performed at [facility], [address], [place], [place] [id number]  Resp panel by RT-PCR (RSV, Flu A&B, Covid) Anterior Nasal Swab     Status: Abnormal   Collection Time: 05/09/23 10:45 PM   Specimen: Anterior Nasal Swab  Result Value Ref Range Status   SARS Coronavirus 2 by RT PCR NEGATIVE NEGATIVE Final    Comment: (NOTE) SARS-CoV-2 target nucleic acids are NOT DETECTED.  The SARS-CoV-2 RNA is generally detectable in upper respiratory specimens during the acute  phase of infection. The lowest concentration of SARS-CoV-2 viral copies this assay can detect is 138 copies/mL. A negative result does not preclude SARS-Cov-2 infection and should not be used as the sole basis for treatment or other patient management decisions. A negative result may occur with  improper specimen collection/handling, submission of specimen other than nasopharyngeal swab, presence of viral mutation(s) within the areas targeted by this assay, and inadequate number of viral copies(<138 copies/mL). A negative result must be combined with clinical observations, patient history, and epidemiological information. The expected result is Negative.  Fact Sheet for Patients:  bloggercourse.com  Fact Sheet for Healthcare Providers:  seriousbroker.it  This test is no t yet approved or cleared by the [place]  FDA and  has been authorized for detection and/or diagnosis of SARS-CoV-2 by FDA under an Emergency Use Authorization (EUA). This EUA will remain  in effect (meaning this test can be used) for the duration of the COVID-19 declaration under Section 564(b)(1) of the Act, 21 U.S.C.section 360bbb-3(b)(1), unless the authorization is terminated  or revoked sooner.       Influenza A by PCR POSITIVE (A) NEGATIVE Final   Influenza B by PCR NEGATIVE NEGATIVE Final    Comment: (NOTE) The Xpert Xpress SARS-CoV-2/FLU/RSV plus assay is intended as an aid in the diagnosis of influenza from Nasopharyngeal swab specimens and should not be used as a sole basis for treatment. Nasal washings and aspirates are unacceptable for Xpert Xpress SARS-CoV-2/FLU/RSV testing.  Fact Sheet for Patients: bloggercourse.com  Fact Sheet for Healthcare Providers: seriousbroker.it  This test is not yet approved or cleared by the [place]  FDA and has been authorized for detection and/or diagnosis  of SARS-CoV-2 by FDA under an Emergency Use Authorization (EUA). This EUA will remain in effect (meaning this test can be used) for the duration of the COVID-19 declaration under Section 564(b)(1) of the Act, 21 U.S.C. section 360bbb-3(b)(1), unless the authorization is terminated or revoked.     Resp Syncytial Virus by PCR NEGATIVE NEGATIVE Final    Comment: (NOTE) Fact Sheet for Patients: bloggercourse.com  Fact Sheet for Healthcare Providers: seriousbroker.it  This test is not yet approved or cleared by the [place]  FDA and has been authorized for detection and/or diagnosis of SARS-CoV-2 by FDA under an Emergency Use Authorization (EUA). This EUA will remain in effect (meaning this test can be used) for the duration of the COVID-19 declaration under Section 564(b)(1) of the Act, 21 U.S.C. section 360bbb-3(b)(1), unless the authorization is terminated or revoked.  Performed at [facility], [address] [place], [place] [address]   Gastrointestinal Panel by PCR , Stool     Status: None   Collection Time: 05/09/23 10:45 PM   Specimen: Anterior Nasal Swab; Stool  Result Value Ref Range Status   Campylobacter species NOT DETECTED NOT DETECTED Final   Plesimonas shigelloides NOT DETECTED NOT DETECTED Final   Salmonella species NOT DETECTED NOT DETECTED Final   Yersinia enterocolitica NOT DETECTED NOT DETECTED Final   Vibrio species NOT DETECTED NOT DETECTED Final   Vibrio cholerae NOT DETECTED NOT DETECTED Final   Enteroaggregative E coli (EAEC) NOT DETECTED NOT DETECTED Final   Enteropathogenic E coli (EPEC) NOT DETECTED NOT DETECTED Final   Enterotoxigenic E coli (ETEC) NOT DETECTED NOT DETECTED Final   Shiga like toxin producing E coli (STEC) NOT DETECTED NOT DETECTED Final   Shigella/Enteroinvasive E coli (EIEC) NOT DETECTED NOT DETECTED Final   Cryptosporidium NOT DETECTED NOT DETECTED Final    Cyclospora cayetanensis NOT DETECTED NOT DETECTED Final   Entamoeba histolytica NOT DETECTED NOT DETECTED Final   Giardia lamblia NOT DETECTED NOT DETECTED Final   Adenovirus F40/41 NOT DETECTED NOT DETECTED Final   Astrovirus NOT DETECTED NOT DETECTED Final   Norovirus GI/GII NOT DETECTED NOT DETECTED Final   Rotavirus A NOT DETECTED NOT DETECTED Final   Sapovirus (I, II, IV, and V) NOT DETECTED NOT DETECTED Final    Comment: Performed at [facility], [address] [place], [place] [address]  MRSA Next Gen by PCR, Nasal     Status: Abnormal   Collection Time: 05/10/23  1:09 AM   Specimen: Nasal Mucosa; Nasal Swab  Result Value Ref Range Status   MRSA by PCR Next Gen DETECTED (A) NOT DETECTED Final    Comment: (NOTE) The GeneXpert MRSA Assay (FDA approved for NASAL specimens only), is one component of a comprehensive MRSA colonization surveillance program. It is not intended to diagnose MRSA infection nor to guide or monitor treatment for MRSA infections. Test performance is not FDA approved in patients less than 95 years old. Performed at [facility], [address] [place], [place] [address]   Respiratory (~20 pathogens) panel by PCR     Status: Abnormal   Collection Time: 05/10/23 12:46 PM   Specimen: Nasopharyngeal Swab; Respiratory  Result Value Ref Range Status   Adenovirus NOT DETECTED NOT DETECTED Final   Coronavirus 229E NOT DETECTED NOT DETECTED Final    Comment: (NOTE) The Coronavirus on the Respiratory Panel, DOES NOT test for the novel  Coronavirus (2019 nCoV)    Coronavirus HKU1 NOT DETECTED NOT DETECTED Final  Coronavirus NL63 NOT DETECTED NOT DETECTED Final   Coronavirus OC43 NOT DETECTED NOT DETECTED Final   Metapneumovirus NOT DETECTED NOT DETECTED Final   Rhinovirus / Enterovirus NOT DETECTED NOT DETECTED Final   Influenza A H1 2009 DETECTED (A) NOT DETECTED Final   Influenza B NOT DETECTED NOT DETECTED Final    Parainfluenza Virus 1 NOT DETECTED NOT DETECTED Final   Parainfluenza Virus 2 NOT DETECTED NOT DETECTED Final   Parainfluenza Virus 3 NOT DETECTED NOT DETECTED Final   Parainfluenza Virus 4 NOT DETECTED NOT DETECTED Final   Respiratory Syncytial Virus NOT DETECTED NOT DETECTED Final   Bordetella pertussis NOT DETECTED NOT DETECTED Final   Bordetella Parapertussis NOT DETECTED NOT DETECTED Final   Chlamydophila pneumoniae NOT DETECTED NOT DETECTED Final   Mycoplasma pneumoniae NOT DETECTED NOT DETECTED Final    Comment: Performed at [facility], [address], [place], [place] [address]   Time coordinating discharge: 25 minutes  SIGNED: Mennie LAMY, MD  [facility] 05/17/2023, 1:53 PM  If 7PM-7AM, please contact night-coverage [email or web address]

## 2023-05-17 NOTE — Progress Notes (Signed)
 PHARMACY - TOTAL PARENTERAL NUTRITION CONSULT NOTE   Indication: Short bowel syndrome  Patient Measurements: Height: 5' 4 (162.6 cm) Weight: 43.7 kg (96 lb 5.5 oz) IBW/kg (Calculated) : 54.7 TPN AdjBW (KG): 47.5 Body mass index is 16.54 kg/m. 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 Last administered: 1/27-1/28 1/31 TPN formula from [employer] faxed to [facility] - Gets lipids on MWF only  Glucose / Insulin : No history of DM - CBGs mostly at goal (100-150) after starting home TPN - refusing SSI yesterday Electrolytes:  - Na, K remain slightly low; Bicarb approaching borderline low - Cl improved to WNL - Mg, Phos, Ca stable WNL Renal: SCr, BUN improved somewhat after bumping up yesterday, UOP not being charted Hepatic: alk phos mildly elevated but improving - Tbili stable WNL; albumin  slightly low - TG remain slightly elevated but improved from 2/3 I/O: no output charted yesterday GI Imaging: 1/29 No obstruction. Left-sided ileostomy post colectomy. GI Surgeries / Procedures: none this admission  Central access: prior to admission CVC double lumen in place TPN start date: resume 2/5  Nutritional Goals:  - From [employer], MWF with lipids cyclic TPN formula provides 80 g of protein and 1214 kcals per day on days with lipids; 915 kcal on days without  RD Assessment:  Estimated Needs Total Energy Estimated Needs: 1650-1800 kcals Total Protein Estimated Needs: 80-95 grams Total Fluid Estimated Needs: >/= 1.7L  Current Nutrition:  Regular diet and TPN  Plan:  Discharging today - Pt to continue TPN from home supply after discharge  Thank you for allowing pharmacy to be a part of this patient's care.  Bard Jeans, PharmD, BCPS [phone] 05/17/2023, 7:34 AM

## 2023-08-20 ENCOUNTER — Other Ambulatory Visit (HOSPITAL_COMMUNITY): Payer: Self-pay | Admitting: Internal Medicine

## 2023-08-20 DIAGNOSIS — K769 Liver disease, unspecified: Secondary | ICD-10-CM

## 2023-08-28 ENCOUNTER — Ambulatory Visit (HOSPITAL_COMMUNITY)
Admission: RE | Admit: 2023-08-28 | Discharge: 2023-08-28 | Disposition: A | Discharge status: Home / Self Care | Source: Ambulatory Visit | Attending: Internal Medicine | Admitting: Internal Medicine

## 2023-08-28 DIAGNOSIS — K769 Liver disease, unspecified: Secondary | ICD-10-CM | POA: Insufficient documentation

## 2023-08-28 MED ORDER — IOHEXOL 350 MG/ML SOLN
75.0000 mL | Freq: Once | INTRAVENOUS | Status: AC | PRN
  Administered 2023-08-28: 75 mL via INTRAVENOUS

## 2023-08-28 MED ORDER — SODIUM CHLORIDE (PF) 0.9 % IJ SOLN
INTRAMUSCULAR | Status: AC
  Filled 2023-08-28: qty 50

## 2023-08-29 ENCOUNTER — Ambulatory Visit (HOSPITAL_COMMUNITY)

## 2023-08-30 ENCOUNTER — Inpatient Hospital Stay (HOSPITAL_BASED_OUTPATIENT_CLINIC_OR_DEPARTMENT_OTHER): Admitting: Hematology and Oncology

## 2023-08-30 VITALS — BP 128/87 | HR 88 | Temp 97.6°F | Resp 16 | Ht 64.0 in | Wt 122.7 lb

## 2023-08-30 DIAGNOSIS — Z170 Estrogen receptor positive status [ER+]: Secondary | ICD-10-CM | POA: Diagnosis not present

## 2023-08-30 DIAGNOSIS — C787 Secondary malignant neoplasm of liver and intrahepatic bile duct: Secondary | ICD-10-CM | POA: Diagnosis not present

## 2023-08-30 DIAGNOSIS — N179 Acute kidney failure, unspecified: Secondary | ICD-10-CM | POA: Diagnosis not present

## 2023-08-30 DIAGNOSIS — C50411 Malignant neoplasm of upper-outer quadrant of right female breast: Secondary | ICD-10-CM | POA: Diagnosis not present

## 2023-08-30 DIAGNOSIS — A419 Sepsis, unspecified organism: Secondary | ICD-10-CM | POA: Diagnosis not present

## 2023-08-30 MED ORDER — MIDODRINE HCL 5 MG PO TABS
5.0000 mg | ORAL_TABLET | Freq: Two times a day (BID) | ORAL | Status: DC
Start: 2023-08-30 — End: 2023-09-03

## 2023-08-30 NOTE — Progress Notes (Signed)
 Patient Care Team: Elester Grim, MD as PCP - General (Internal Medicine) Bridgette Campus, MD as PCP - Cardiology (Cardiology) Auther Bo, RN as Oncology Nurse Navigator Alane Hsu, RN as Oncology Nurse Navigator Joyce Nixon, MD as Consulting Physician (Colon and Rectal Surgery) Kenney Peacemaker, MD as Consulting Physician (Gastroenterology) Enid Harry, MD as Consulting Physician (General Surgery) Cameron Cea, MD as Consulting Physician (Hematology and Oncology)  DIAGNOSIS:  Encounter Diagnoses  Name Primary?   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 palpable right breast mass measuring about 7 cm x 5 and half centimeters, palpable right axillary lymph node with concern for skin invasion    06/08/2020 Cancer Staging   Staging form: Breast, AJCC 8th Edition - Clinical: Stage IIIB (cT4d, cN1, cM0, G3, ER+, PR-, HER2+) - Signed by Cameron Cea, MD on 07/07/2020 Histologic grading system: 3 grade system   06/24/2020 Genetic Testing   No pathogenic variants detected in [employer] +RNAinsight.  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  The report date is June 24, 2020.   The CustomNext-Cancer+RNAinsight panel offered by [facility] [employer] includes sequencing and rearrangement analysis for the following 47 genes:  APC, ATM, AXIN2, BARD1, BMPR1A, BRCA1, BRCA2, BRIP1, CDH1, CDK4, CDKN2A, CHEK2,  DICER1, EPCAM, GREM1, HOXB13, MEN1, MLH1, MSH2, MSH3, MSH6, MUTYH, NBN, NF1, NF2, NTHL1, PALB2, PMS2, POLD1, POLE, PTEN, RAD51C, RAD51D, RECQL, RET, SDHA, SDHAF2, SDHB, SDHC, SDHD, SMAD4, SMARCA4, STK11, TP53, TSC1, TSC2, and VHL.  RNA data is routinely analyzed for use in variant interpretation for all genes.   06/25/2020 - 10/29/2020 Chemotherapy   TCHP x6 cycles   11/09/2020 Surgery   Bilateral mastectomies: Left mastectomy: Benign Right mastectomy: Residual microinvasive cancer status post neoadjuvant therapy, DCIS, 0/12 lymph nodes, ER +1%, PR negative, HER2 equivocal 2+ by IHC   11/19/2020 - 07/15/2021 Chemotherapy   Patient is on Treatment Plan : BREAST Trastuzumab   + Pertuzumab  q21d x 13 cycles     01/11/2021 - 03/09/2021 Radiation Therapy   Site Technique Total Dose (Gy) Dose per Fx (Gy) Completed Fx Beam Energies  Chest Wall, Right: CW_Rt 3D 50.4/50.4 1.8 28/28 10X  Chest Wall, Right: CW_Rt_SCLV 3D 50.4/50.4 1.8 28/28 6X, 10X  Chest Wall, Right: CW_Rt_Bst Electron 10/10 2 5/5 6E     06/08/2021 - 06/24/2021 Anti-estrogen oral therapy   Tamoxifen  (discontinued)   03/24/2022 Relapse/Recurrence   Right chest palpable nodule: 1.2 cm mass at 10 o'clock position biopsy revealed grade 2 IDC with high-grade DCIS ER 20% positive weak staining, PR 0%, HER2 3+ positive, Ki-67 50%   04/06/2022 Imaging   CT chest/abd/pelvis  IMPRESSION: 1. No evidence of metastatic disease in the chest, abdomen or pelvis. 2. Similar probable postradiation change in the peripheral anterior right upper lobe, anterior right middle lobe, lateral right lower lobe and right lung apex. 3. Surgical changes of subtotal colectomy with short segment wall thickening of the neo terminal ileum and  short segments of small bowel throughout the abdomen pelvis, with interposed gas and fluid-filled distended loops of small bowel intermixed between the areas of inflammation. 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 4. Small volume right lower quadrant and pelvic free fluid, likely reactive.     Electronically Signed   By: Tama Fails M.D.   On: 04/06/2022 12:53     05/02/2022 Surgery   Right lumpectomy: Recurrent grade 3 IDC, posterior margin focally involved by cancer.   05/17/2022 -  Chemotherapy   Patient is on Treatment Plan : BREAST ADO-Trastuzumab Emtansine  (Kadcyla ) q21d       CHIEF COMPLIANT: F/U of liver mets  HISTORY OF PRESENT ILLNESS:  History of Present Illness [name] is a 48 year old female with a history of colon removal and TPN dependency who presents with new liver findings on imaging. She was referred by her primary provider for evaluation of liver findings noted on imaging.  She has been on total parenteral nutrition (TPN) since September following extensive colon removal. Recent imaging, initially intended to assess abdominal fluid, revealed new liver findings not present in a January CT scan. She experiences occasional transient pain in the right upper quadrant.  She completed five treatments with Kadcyla  post-surgery, which were complicated by side effects including anorexia, inability to eat, and neuropathy. She manages loose bowel movements with Imodium  and has an ileostomy bag.  Her current medications include Imodium , midodrine  5 mg twice daily, and a multivitamin. She has not received a medication called in for her in October. Her family history includes her mother's recent death, and she has not disclosed her medical situation to her family.     ALLERGIES:  is allergic to nsaids, vancomycin , chlorhexidine  gluconate, hydromorphone , lactose, and lactose intolerance (gi).  MEDICATIONS:  Current Outpatient Medications  Medication Sig Dispense Refill   midodrine  (PROAMATINE ) 5 MG tablet Take 1 tablet (5 mg total) by mouth 2 (two) times daily with a meal.     sterile water SOLN with amino acids  10 % SOLN 1.3 g/kg, dextrose  70 % SOLN 20 % Inject  into the vein continuous. Infuse TPN 3/1 1500 ml IV through PICC Line Via ambulatory infusion pump over 16 Hour with 1 Hour Ramp Up & 1 Hour Ramp Down 3 X Week (MWF). Add 10 ml MVI Daily Prior To Infusion. Bag Volume: 1550 ml  Base Formula           Amount/Day         Amount/L  Dextrose  70%          175.00 Gm           116.67 Gm Plenamine 15%        80.00 Gm             53.33 Gm SMOFlipid 20%        40.00 Gm             26.67 Gm  Electrolytes  Calcium  Glucona     6.00 meq              4.00 meq Magnesium  Sulf      8.00 meq               5.33 meq Sodium Chloride      230 meq                153.33 meq Potassium Phos     15 mM  10.00 mM K Acetate                67.00 meq              44.67 meq  Vitamins, trace elements and medications Tralement               1.00 mL                   0.67 mL     No current facility-administered medications for this visit.    PHYSICAL EXAMINATION: ECOG PERFORMANCE STATUS: 1 - Symptomatic but completely ambulatory  Vitals:   08/30/23 1516  BP: 128/87  Pulse: 88  Resp: 16  Temp: 97.6 F (36.4 C)  SpO2: 100%   Filed Weights   08/30/23 1516  Weight: 122 lb 11.2 oz (55.7 kg)    LABORATORY DATA:  I have reviewed the data as listed    Latest Ref Rng & Units 05/17/2023    1:30 AM 05/16/2023   11:31 AM 05/16/2023   12:17 AM  CMP  Glucose 70 - 99 mg/dL 308  657  83   BUN 6 - 20 mg/dL 42  48  45   Creatinine 0.44 - 1.00 mg/dL 8.46  9.62  9.52   Sodium 135 - 145 mmol/L 133  133  135   Potassium 3.5 - 5.1 mmol/L 3.6  3.4  3.8   Chloride 98 - 111 mmol/L 101  95  95   CO2 22 - 32 mmol/L 23  25  25    Calcium  8.9 - 10.3 mg/dL 8.4  9.7  84.1   Total Protein 6.5 - 8.1 g/dL 7.3     Total Bilirubin 0.0 - 1.2 mg/dL 0.8     Alkaline Phos 38 - 126 U/L 144     AST 15 - 41 U/L 39     ALT 0 - 44 U/L 49       Lab Results  Component Value Date   WBC 7.9 05/13/2023   HGB 8.5 (L) 05/13/2023   HCT 28.0 (L) 05/13/2023   MCV 87.5 05/13/2023    PLT 270 05/13/2023   NEUTROABS 18.3 (H) 05/09/2023    ASSESSMENT & PLAN:  Malignant neoplasm of upper-outer quadrant of right breast in female, estrogen receptor positive (HCC) /24/2022, US  guided biopsy of the right breast 9 0 clock mass showed grade III IDC; Prognostics showed ER 40% positive, weak staining, PR positive 0 %, Her 2 3 +, Ki 67 30% 4dCN1M0 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 palpable right breast mass measuring about 7 cm x 5 and half centimeters, palpable right axillary lymph node with concern for skin invasion    Treatment Plan: 1. Neoadjuvant chemo with TCHP q 3 weeks X 6 followed by HP completed 07/07/2021 2. Bilateral mastectomies: Left mastectomy: Benign Right mastectomy: Residual microinvasive cancer status post neoadjuvant therapy, DCIS, 0/12 lymph nodes, ER +1%, PR negative, HER2 equivocal 2+ by IHC 3. Adj XRT completed 02/23/2021 4. Adj Anti-estrogen therapy with tamoxifen  started 06/08/2021 discontinued 06/24/2021 (ER was only 1% therefore we felt risks outweigh benefits) 5. Neratinib: Because of her bowel issues she decided not to take it. 6. Right chest palpable nodule: 1.2 cm mass at 10 o'clock position biopsy revealed grade 2 IDC with high-grade DCIS ER 20% positive weak staining, PR 0%, HER2 3+ positive, Ki-67 50% 7. 05/02/2022: Right lumpectomy: Recurrent grade 3 IDC, posterior margin focally involved by cancer April 2024: Skin right breast excision: Benign  8.  Kadcyla  x 5 cycles completed 05/17/2022- 09/21/2022 (stopped because of intolerance) --------------------------------------------------------------------------------------------------------- Hospitalization 05/18/2021-06/13/2021 (small bowel obstruction)   CT abdomen and pelvis 08/28/2023: Multiple new hepatic metastases largest measuring 5.2 cm  Recommendation: Liver biopsy PET CT scan  Return after that to discuss results and come up with a treatment plan. Very concerned about her ability to  tolerate treatments  ------------------------------------- Assessment and Plan Assessment & Plan Malignant neoplasm of liver New liver lesions likely metastatic from previous breast cancer. Requires biopsy to confirm HER2 status. Discussed treatment options including Enhertu, effective for HER2 positive and possibly HER2 negative cases, with side effects such as nausea and fatigue. Enhertu has shown positive outcomes in extensive liver disease, with some patients living well for three to four years. Alternatives include tucatinib with Herceptin  and possibly Xeloda, or tamoxifen  with verzenio if HER2 negative but ER positive. Emphasized confirming tissue diagnosis to tailor treatment. - Order liver biopsy to confirm HER2 status. - Order whole body PET scan to assess extent of disease. - Discuss potential treatment options including Enhertu, tucatinib with Herceptin , and tamoxifen  with verzenio based on biopsy results.  Malignant neoplasm of breast Previous breast cancer with HER2 positive status. Underwent surgery, chemotherapy, and Kadcyla  treatment. Current concern for metastasis to the liver. Treatment history includes Herceptin  and Kadcyla , with significant side effects leading to discontinuation.  Short bowel syndrome On TPN since September due to extensive bowel resection. Managing with Imodium  for loose stools due to ileostomy. Monitoring weight gain to assess potential TPN discontinuation. - Continue TPN and monitor weight gain. - Continue Imodium  for management of loose stools.  Goals of Care She expressed concerns about burdening family with health issues, especially after recent family loss. Discussed the importance of family support and potential benefits of sharing health information. She desires to handle the situation independently but acknowledged potential need for family involvement. - Encourage discussion with family about health status and treatment  plans.  Follow-up Pending liver biopsy and PET scan. Results will guide further treatment decisions. - Schedule liver biopsy and PET scan within 1-2 weeks. - Follow up after results are available to discuss next steps.      Orders Placed This Encounter  Procedures   US  BIOPSY (LIVER)    Standing Status:   Future    Expected Date:   09/06/2023    Expiration Date:   08/29/2024    Lab orders requested (DO NOT place separate lab orders, these will be automatically ordered during procedure specimen collection)::   Surgical Pathology    Reason for Exam (SYMPTOM  OR DIAGNOSIS REQUIRED):   Liver mets    Preferred location?:   [facility]   NM PET Image Restag (PS) Skull Base To Thigh    Standing Status:   Future    Expected Date:   09/06/2023    Expiration Date:   08/29/2024    If indicated for the ordered procedure, I authorize the administration of a radiopharmaceutical per Radiology protocol:   Yes    Is the patient pregnant?:   No    Preferred imaging location?:   [facility]    Release to patient:   Immediate   The patient has a good understanding of the overall plan. she agrees with it. she will call with any problems that may develop before the next visit here. Total time spent: 45 mins including face to face time and time spent for planning, charting and co-ordination of care   Viinay K Emmakate Hypes,  MD 08/30/23

## 2023-08-30 NOTE — Progress Notes (Signed)
 Courtney Asper, DO  Cahterine Heinzel; Rosetta Cons, MD; P Ir Procedure Requests OK for US  guided liver mass biopsy.  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

## 2023-08-30 NOTE — Assessment & Plan Note (Signed)
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 palpable right breast mass measuring about 7 cm x 5 and half centimeters, palpable right axillary lymph node with concern for skin invasion    Treatment Plan: 1. Neoadjuvant chemo with TCHP q 3 weeks X 6 followed by HP completed 07/07/2021 2. Bilateral mastectomies: Left mastectomy: Benign Right mastectomy: Residual microinvasive cancer status post neoadjuvant therapy, DCIS, 0/12 lymph nodes, ER +1%, PR negative, HER2 equivocal 2+ by IHC 3. Adj XRT completed 02/23/2021 4. Adj Anti-estrogen therapy with tamoxifen  started 06/08/2021 discontinued 06/24/2021 (ER was only 1% therefore we felt risks outweigh benefits) 5. Neratinib: Because of her bowel issues she decided not to take it. 6. Right chest palpable nodule: 1.2 cm mass at 10 o'clock position biopsy revealed grade 2 IDC with high-grade DCIS ER 20% positive weak staining, PR 0%, HER2 3+ positive, Ki-67 50% 7. 05/02/2022: Right lumpectomy: Recurrent grade 3 IDC, posterior margin focally involved by cancer April 2024: Skin right breast excision: Benign 8.  Kadcyla  x 5 cycles completed 05/17/2022- 09/21/2022 (stopped because of intolerance) --------------------------------------------------------------------------------------------------------- Hospitalization 05/18/2021-06/13/2021 (small bowel obstruction)   CT abdomen and pelvis 08/28/2023: Multiple new hepatic metastases largest measuring 5.2 cm  Recommendation: ENHERTU is a novel antibody drug conjugate between trastuzumab  and SN 32.  Most recent clinical trial destiny 01 revealed a 60% response rate and median duration of response 14.8 months.  Major toxicities to be watchful of include interstitial lung disease, cytopenias, left ventricular dysfunction. The most common adverse  reactions (frequency >=20%) were nausea (79%), fatigue (59%), vomiting (47%), alopecia (46%), constipation (35%), decreased appetite (32%), anemia (31%), neutropenia (29%), diarrhea (29%), leukopenia (22%), cough (20%), and thrombocytopenia (20%).  Return to clinic to start Enhertu

## 2023-09-02 ENCOUNTER — Emergency Department (HOSPITAL_COMMUNITY)

## 2023-09-02 ENCOUNTER — Other Ambulatory Visit: Payer: Self-pay

## 2023-09-02 ENCOUNTER — Inpatient Hospital Stay (HOSPITAL_COMMUNITY)
Admission: EM | Admit: 2023-09-02 | Discharge: 2023-09-08 | DRG: 871 | Disposition: A | Discharge status: Home Health | Attending: Internal Medicine | Admitting: Internal Medicine

## 2023-09-02 ENCOUNTER — Encounter (HOSPITAL_COMMUNITY): Payer: Self-pay

## 2023-09-02 DIAGNOSIS — I9589 Other hypotension: Secondary | ICD-10-CM | POA: Diagnosis present

## 2023-09-02 DIAGNOSIS — N179 Acute kidney failure, unspecified: Secondary | ICD-10-CM | POA: Diagnosis present

## 2023-09-02 DIAGNOSIS — Z86711 Personal history of pulmonary embolism: Secondary | ICD-10-CM | POA: Diagnosis not present

## 2023-09-02 DIAGNOSIS — Z9049 Acquired absence of other specified parts of digestive tract: Secondary | ICD-10-CM | POA: Diagnosis not present

## 2023-09-02 DIAGNOSIS — J159 Unspecified bacterial pneumonia: Secondary | ICD-10-CM | POA: Diagnosis not present

## 2023-09-02 DIAGNOSIS — E871 Hypo-osmolality and hyponatremia: Secondary | ICD-10-CM | POA: Diagnosis present

## 2023-09-02 DIAGNOSIS — K90829 Short bowel syndrome, unspecified: Secondary | ICD-10-CM | POA: Diagnosis present

## 2023-09-02 DIAGNOSIS — Z1152 Encounter for screening for COVID-19: Secondary | ICD-10-CM

## 2023-09-02 DIAGNOSIS — R578 Other shock: Secondary | ICD-10-CM | POA: Diagnosis present

## 2023-09-02 DIAGNOSIS — J189 Pneumonia, unspecified organism: Secondary | ICD-10-CM | POA: Diagnosis present

## 2023-09-02 DIAGNOSIS — D849 Immunodeficiency, unspecified: Secondary | ICD-10-CM | POA: Diagnosis present

## 2023-09-02 DIAGNOSIS — Z923 Personal history of irradiation: Secondary | ICD-10-CM | POA: Diagnosis not present

## 2023-09-02 DIAGNOSIS — I959 Hypotension, unspecified: Secondary | ICD-10-CM | POA: Diagnosis not present

## 2023-09-02 DIAGNOSIS — Z933 Colostomy status: Secondary | ICD-10-CM

## 2023-09-02 DIAGNOSIS — A419 Sepsis, unspecified organism: Secondary | ICD-10-CM | POA: Diagnosis present

## 2023-09-02 DIAGNOSIS — E2749 Other adrenocortical insufficiency: Secondary | ICD-10-CM | POA: Diagnosis present

## 2023-09-02 DIAGNOSIS — E876 Hypokalemia: Secondary | ICD-10-CM | POA: Diagnosis present

## 2023-09-02 DIAGNOSIS — Z87891 Personal history of nicotine dependence: Secondary | ICD-10-CM

## 2023-09-02 DIAGNOSIS — Z79899 Other long term (current) drug therapy: Secondary | ICD-10-CM

## 2023-09-02 DIAGNOSIS — J09X1 Influenza due to identified novel influenza A virus with pneumonia: Secondary | ICD-10-CM | POA: Diagnosis not present

## 2023-09-02 DIAGNOSIS — C787 Secondary malignant neoplasm of liver and intrahepatic bile duct: Secondary | ICD-10-CM | POA: Diagnosis present

## 2023-09-02 DIAGNOSIS — Z853 Personal history of malignant neoplasm of breast: Secondary | ICD-10-CM | POA: Diagnosis not present

## 2023-09-02 DIAGNOSIS — Z5329 Procedure and treatment not carried out because of patient's decision for other reasons: Secondary | ICD-10-CM | POA: Diagnosis not present

## 2023-09-02 DIAGNOSIS — K5010 Crohn's disease of large intestine without complications: Secondary | ICD-10-CM | POA: Diagnosis present

## 2023-09-02 DIAGNOSIS — R6521 Severe sepsis with septic shock: Secondary | ICD-10-CM | POA: Diagnosis present

## 2023-09-02 LAB — URINALYSIS, W/ REFLEX TO CULTURE (INFECTION SUSPECTED)
Bilirubin Urine: NEGATIVE
Glucose, UA: NEGATIVE mg/dL
Hgb urine dipstick: NEGATIVE
Ketones, ur: NEGATIVE mg/dL
Nitrite: NEGATIVE
Protein, ur: NEGATIVE mg/dL
Specific Gravity, Urine: 1.01 (ref 1.005–1.030)
pH: 5 (ref 5.0–8.0)

## 2023-09-02 LAB — CBC WITH DIFFERENTIAL/PLATELET
Abs Immature Granulocytes: 0.42 10*3/uL — ABNORMAL HIGH (ref 0.00–0.07)
Basophils Absolute: 0 10*3/uL (ref 0.0–0.1)
Basophils Relative: 0 %
Eosinophils Absolute: 0 10*3/uL (ref 0.0–0.5)
Eosinophils Relative: 0 %
HCT: 32.9 % — ABNORMAL LOW (ref 36.0–46.0)
Hemoglobin: 10.7 g/dL — ABNORMAL LOW (ref 12.0–15.0)
Immature Granulocytes: 2 %
Lymphocytes Relative: 5 %
Lymphs Abs: 0.9 10*3/uL (ref 0.7–4.0)
MCH: 26.5 pg (ref 26.0–34.0)
MCHC: 32.5 g/dL (ref 30.0–36.0)
MCV: 81.4 fL (ref 80.0–100.0)
Monocytes Absolute: 0.7 10*3/uL (ref 0.1–1.0)
Monocytes Relative: 4 %
Neutro Abs: 16.5 10*3/uL — ABNORMAL HIGH (ref 1.7–7.7)
Neutrophils Relative %: 89 %
Platelets: 195 10*3/uL (ref 150–400)
RBC: 4.04 MIL/uL (ref 3.87–5.11)
RDW: 13.5 % (ref 11.5–15.5)
WBC: 18.6 10*3/uL — ABNORMAL HIGH (ref 4.0–10.5)
nRBC: 0 % (ref 0.0–0.2)

## 2023-09-02 LAB — COMPREHENSIVE METABOLIC PANEL WITH GFR
ALT: 55 U/L — ABNORMAL HIGH (ref 0–44)
AST: 78 U/L — ABNORMAL HIGH (ref 15–41)
Albumin: 3.2 g/dL — ABNORMAL LOW (ref 3.5–5.0)
Alkaline Phosphatase: 187 U/L — ABNORMAL HIGH (ref 38–126)
Anion gap: 11 (ref 5–15)
BUN: 44 mg/dL — ABNORMAL HIGH (ref 6–20)
CO2: 23 mmol/L (ref 22–32)
Calcium: 8.2 mg/dL — ABNORMAL LOW (ref 8.9–10.3)
Chloride: 98 mmol/L (ref 98–111)
Creatinine, Ser: 2.47 mg/dL — ABNORMAL HIGH (ref 0.44–1.00)
GFR, Estimated: 24 mL/min — ABNORMAL LOW (ref >60)
Glucose, Bld: 90 mg/dL (ref 70–99)
Potassium: 3.4 mmol/L — ABNORMAL LOW (ref 3.5–5.1)
Sodium: 132 mmol/L — ABNORMAL LOW (ref 135–145)
Total Bilirubin: 4 mg/dL — ABNORMAL HIGH (ref 0.0–1.2)
Total Protein: 8 g/dL (ref 6.5–8.1)

## 2023-09-02 LAB — I-STAT CHEM 8, ED
BUN: 40 mg/dL — ABNORMAL HIGH (ref 6–20)
Calcium, Ion: 1.02 mmol/L — ABNORMAL LOW (ref 1.15–1.40)
Chloride: 98 mmol/L (ref 98–111)
Creatinine, Ser: 2.8 mg/dL — ABNORMAL HIGH (ref 0.44–1.00)
Glucose, Bld: 90 mg/dL (ref 70–99)
HCT: 33 % — ABNORMAL LOW (ref 36.0–46.0)
Hemoglobin: 11.2 g/dL — ABNORMAL LOW (ref 12.0–15.0)
Potassium: 3.6 mmol/L (ref 3.5–5.1)
Sodium: 134 mmol/L — ABNORMAL LOW (ref 135–145)
TCO2: 21 mmol/L — ABNORMAL LOW (ref 22–32)

## 2023-09-02 LAB — I-STAT CG4 LACTIC ACID, ED
Lactic Acid, Venous: 2.5 mmol/L (ref 0.5–1.9)
Lactic Acid, Venous: 2.2 mmol/L (ref 0.5–1.9)

## 2023-09-02 LAB — BLOOD GAS, VENOUS
Acid-Base Excess: 1.1 mmol/L (ref 0.0–2.0)
Bicarbonate: 25.1 mmol/L (ref 20.0–28.0)
O2 Saturation: 20.6 %
Patient temperature: 37
pCO2, Ven: 37 mmHg — ABNORMAL LOW (ref 44–60)
pH, Ven: 7.44 — ABNORMAL HIGH (ref 7.25–7.43)
pO2, Ven: <31 mmHg — CL (ref 32–45)

## 2023-09-02 LAB — RESP PANEL BY RT-PCR (RSV, FLU A&B, COVID)  RVPGX2
Influenza A by PCR: NEGATIVE
Influenza B by PCR: NEGATIVE
Resp Syncytial Virus by PCR: NEGATIVE
SARS Coronavirus 2 by RT PCR: NEGATIVE

## 2023-09-02 LAB — MAGNESIUM: Magnesium: 1.2 mg/dL — ABNORMAL LOW (ref 1.7–2.4)

## 2023-09-02 LAB — MRSA NEXT GEN BY PCR, NASAL: MRSA by PCR Next Gen: DETECTED — AB

## 2023-09-02 MED ORDER — SODIUM CHLORIDE 0.9 % IV SOLN: 2.0000 g | INTRAVENOUS | Status: DC

## 2023-09-02 MED ORDER — HYDROCODONE-ACETAMINOPHEN 5-325 MG PO TABS
1.0000 | ORAL_TABLET | ORAL | Status: AC | PRN
  Administered 2023-09-02 – 2023-09-03 (×2): 1 via ORAL
  Filled 2023-09-02 (×2): qty 1

## 2023-09-02 MED ORDER — POTASSIUM CHLORIDE CRYS ER 20 MEQ PO TBCR
40.0000 meq | EXTENDED_RELEASE_TABLET | Freq: Once | ORAL | Status: DC
  Filled 2023-09-02: qty 2

## 2023-09-02 MED ORDER — MAGNESIUM SULFATE 2 GM/50ML IV SOLN
2.0000 g | Freq: Once | INTRAVENOUS | Status: AC
  Administered 2023-09-02: 2 g via INTRAVENOUS
  Filled 2023-09-02: qty 50

## 2023-09-02 MED ORDER — HEPARIN SODIUM (PORCINE) 5000 UNIT/ML IJ SOLN
5000.0000 [IU] | Freq: Three times a day (TID) | INTRAMUSCULAR | Status: DC
  Filled 2023-09-02: qty 1

## 2023-09-02 MED ORDER — LACTATED RINGERS IV BOLUS (SEPSIS)
1000.0000 mL | Freq: Once | INTRAVENOUS | Status: AC
  Administered 2023-09-02: 1000 mL via INTRAVENOUS

## 2023-09-02 MED ORDER — SODIUM CHLORIDE 0.9 % IV SOLN
2.0000 g | Freq: Once | INTRAVENOUS | Status: AC
  Administered 2023-09-02: 2 g via INTRAVENOUS
  Filled 2023-09-02: qty 12.5

## 2023-09-02 MED ORDER — LACTATED RINGERS IV BOLUS (SEPSIS)
250.0000 mL | Freq: Once | INTRAVENOUS | Status: AC
  Administered 2023-09-02: 250 mL via INTRAVENOUS

## 2023-09-02 MED ORDER — LACTATED RINGERS IV SOLN: INTRAVENOUS | Status: DC

## 2023-09-02 MED ORDER — NOREPINEPHRINE 4 MG/250ML-% IV SOLN
0.0000 ug/min | INTRAVENOUS | Status: DC
  Administered 2023-09-02: 2 ug/min via INTRAVENOUS
  Administered 2023-09-03: 6 ug/min via INTRAVENOUS
  Administered 2023-09-04: 2 ug/min via INTRAVENOUS
  Filled 2023-09-02 (×2): qty 250

## 2023-09-02 MED ORDER — LACTATED RINGERS IV SOLN: INTRAVENOUS | Status: AC

## 2023-09-02 MED ORDER — VANCOMYCIN HCL IN DEXTROSE 1-5 GM/200ML-% IV SOLN
1000.0000 mg | INTRAVENOUS | Status: AC
  Administered 2023-09-02: 1000 mg via INTRAVENOUS
  Filled 2023-09-02: qty 200

## 2023-09-02 MED ORDER — VANCOMYCIN HCL IN DEXTROSE 1-5 GM/200ML-% IV SOLN: 1000.0000 mg | INTRAVENOUS | Status: DC

## 2023-09-02 MED ORDER — POLYETHYLENE GLYCOL 3350 17 G PO PACK: 17.0000 g | PACK | Freq: Every day | ORAL | Status: DC | PRN

## 2023-09-02 MED ORDER — ACETAMINOPHEN 325 MG PO TABS
650.0000 mg | ORAL_TABLET | Freq: Once | ORAL | Status: AC
Start: 2023-09-02 — End: 2023-09-02
  Administered 2023-09-02: 650 mg via ORAL
  Filled 2023-09-02: qty 2

## 2023-09-02 MED ORDER — MIDODRINE HCL 5 MG PO TABS
5.0000 mg | ORAL_TABLET | Freq: Two times a day (BID) | ORAL | Status: DC
Start: 2023-09-02 — End: 2023-09-03
  Administered 2023-09-02: 5 mg via ORAL
  Filled 2023-09-02: qty 1

## 2023-09-02 MED ORDER — METRONIDAZOLE 500 MG/100ML IV SOLN
500.0000 mg | Freq: Once | INTRAVENOUS | Status: AC
  Administered 2023-09-02: 500 mg via INTRAVENOUS
  Filled 2023-09-02: qty 100

## 2023-09-02 MED ORDER — ONDANSETRON HCL 4 MG/2ML IJ SOLN: 4.0000 mg | Freq: Four times a day (QID) | INTRAMUSCULAR | Status: DC | PRN

## 2023-09-02 MED ORDER — LACTATED RINGERS IV BOLUS (SEPSIS)
500.0000 mL | Freq: Once | INTRAVENOUS | Status: AC
  Administered 2023-09-02: 500 mL via INTRAVENOUS

## 2023-09-02 MED ORDER — SODIUM CHLORIDE 0.9 % IV SOLN
150.0000 mg | INTRAVENOUS | Status: DC
  Administered 2023-09-02: 150 mg via INTRAVENOUS
  Filled 2023-09-02 (×2): qty 7.5

## 2023-09-02 MED ORDER — NOREPINEPHRINE 4 MG/250ML-% IV SOLN
0.0000 ug/min | INTRAVENOUS | Status: DC
  Administered 2023-09-02: 2 ug/min via INTRAVENOUS
  Filled 2023-09-02: qty 250

## 2023-09-02 MED ORDER — DOCUSATE SODIUM 100 MG PO CAPS: 100.0000 mg | ORAL_CAPSULE | Freq: Two times a day (BID) | ORAL | Status: DC | PRN

## 2023-09-02 MED ORDER — ACETAMINOPHEN 325 MG PO TABS: 650.0000 mg | ORAL_TABLET | ORAL | Status: DC | PRN

## 2023-09-02 NOTE — Sepsis Progress Note (Signed)
 Elink contacted [facility] for need of 2nd lactic, RN stated would draw now

## 2023-09-02 NOTE — ED Triage Notes (Addendum)
 Patient BIB [facility] from home. Complaining of lower left sided back that worsens with movement or palpation. EMS found patient with heart rate 160s and BP 60 systolic. Lethargic on arrival. PICC line in place. Patient has breast cancer and completed chemo. Patient did not take her midodrine  today. Family is sick.  EMS 150 HR 91/50 bolus LR

## 2023-09-02 NOTE — Progress Notes (Signed)
 eLink Physician-Brief Progress Note Patient Name: [name] DOB: 10-Jun-1975 MRN: [id number]   Date of Service  09/02/2023  HPI/Events of Note  Patient only has Tylenol  for 13/ 10 pain. Allergic to NSAID and hydromorphone .  Patient seen groaning in pain Patient states she has tolerated Vicodin previously  eICU Interventions  Vicodin 5-325 mg ordered q 4 prn x 2 doses Spoke with BSRN     Intervention Category Intermediate Interventions: Pain - evaluation and management  Turner Gains 09/02/2023, 9:59 PM

## 2023-09-02 NOTE — ED Provider Notes (Addendum)
 [place] [facility] AT [facility] Provider Note   CSN: [id number] Arrival date & time: 09/02/23  1422     History  Chief Complaint  Patient presents with   Fatigue    [name] is a 48 y.o. female.  HPI Patient presents for multiple complaints.  Her medical history includes Crohn's disease, bowel necrosis s/p colostomy placement, short gut syndrome, breast cancer, anemia, PE.  Yesterday, she experienced fatigue.  She was otherwise in her normal state of health up until last night.  Last night, she developed a left chest wall pain.  This has persisted today.  Patient has had additional symptoms of cough, generalized weakness.  EMS noted tachycardia, fever, and hypotension.  She does have soft blood pressures at baseline and does take midodrine  twice a day.  Last dose of this was yesterday morning.  In terms of her breast cancer history, her oncologist is Dr. Lee Public.  She was initially diagnosed 3 years ago.  She underwent bilateral mastectomies, chemotherapy.  She had a relapse a year and a half ago.  She underwent further chemotherapy which was completed last year.  She has recently had lesions identified on her liver.  Plan is for biopsy to assess for possible metastatic disease.  She is currently not on any cancer treatment.    Home Medications Prior to Admission medications   Medication Sig Start Date End Date Taking? Authorizing Provider  midodrine  (PROAMATINE ) 5 MG tablet Take 1 tablet (5 mg total) by mouth 2 (two) times daily with a meal. 08/30/23   Cameron Cea, MD  sterile water SOLN with amino acids  10 % SOLN 1.3 g/kg, dextrose  70 % SOLN 20 % Inject into the vein continuous. Infuse TPN 3/1 1500 ml IV through PICC Line Via ambulatory infusion pump over 16 Hour with 1 Hour Ramp Up & 1 Hour Ramp Down 3 X Week (MWF). Add 10 ml MVI Daily Prior To Infusion. Bag Volume: 1550 ml  Base Formula           Amount/Day         Amount/L  Dextrose  70%           175.00 Gm           116.67 Gm Plenamine 15%        80.00 Gm             53.33 Gm SMOFlipid 20%        40.00 Gm             26.67 Gm  Electrolytes  Calcium  Glucona     6.00 meq              4.00 meq Magnesium  Sulf      8.00 meq               5.33 meq Sodium Chloride      230 meq                153.33 meq Potassium Phos     15 mM                    10.00 mM K Acetate                67.00 meq              44.67 meq  Vitamins, trace elements and medications Tralement               1.00 mL  0.67 mL    [provider]      Allergies    Nsaids, Vancomycin , Chlorhexidine  gluconate, Hydromorphone , Lactose, and Lactose intolerance (gi)    Review of Systems   Review of Systems  Constitutional:  Positive for activity change, fatigue and fever.  Respiratory:  Positive for cough.   Cardiovascular:  Positive for chest pain.  Gastrointestinal:  Positive for abdominal pain.  Musculoskeletal:  Positive for back pain.  All other systems reviewed and are negative.   Physical Exam Updated Vital Signs BP (!) 73/49   Pulse (!) 141   Temp (!) 100.9 F (38.3 C) (Oral)   Resp (!) 31   Ht 5\' 4"  (1.626 m)   Wt 54.9 kg   LMP 04/24/2021   SpO2 93%   BMI 20.77 kg/m  Physical Exam Vitals and nursing note reviewed.  Constitutional:      General: She is not in acute distress.    Appearance: Normal appearance. She is well-developed. She is not ill-appearing, toxic-appearing or diaphoretic.  HENT:     Head: Normocephalic and atraumatic.     Right Ear: External ear normal.     Left Ear: External ear normal.     Nose: Nose normal.     Mouth/Throat:     Mouth: Mucous membranes are moist.  Eyes:     Extraocular Movements: Extraocular movements intact.     Conjunctiva/sclera: Conjunctivae normal.  Cardiovascular:     Rate and Rhythm: Regular rhythm. Tachycardia present.     Heart sounds: No murmur heard. Pulmonary:     Effort: Pulmonary effort is normal. No respiratory  distress.     Breath sounds: Normal breath sounds. No wheezing or rales.  Chest:     Chest wall: No tenderness.  Abdominal:     General: There is no distension.     Palpations: Abdomen is soft.     Tenderness: There is no abdominal tenderness.     Comments: Colostomy in place  Musculoskeletal:        General: No swelling. Normal range of motion.     Cervical back: Normal range of motion and neck supple.  Skin:    General: Skin is warm and dry.     Coloration: Skin is not jaundiced or pale.  Neurological:     General: No focal deficit present.     Mental Status: She is alert and oriented to person, place, and time.  Psychiatric:        Mood and Affect: Mood normal.        Behavior: Behavior normal.     ED Results / Procedures / Treatments   Labs (all labs ordered are listed, but only abnormal results are displayed) Labs Reviewed  COMPREHENSIVE METABOLIC PANEL WITH GFR - Abnormal; Notable for the following components:      Result Value   Sodium 132 (*)    Potassium 3.4 (*)    BUN 44 (*)    Creatinine, Ser 2.47 (*)    Calcium  8.2 (*)    Albumin  3.2 (*)    AST 78 (*)    ALT 55 (*)    Alkaline Phosphatase 187 (*)    Total Bilirubin 4.0 (*)    GFR, Estimated 24 (*)    All other components within normal limits  CBC WITH DIFFERENTIAL/PLATELET - Abnormal; Notable for the following components:   WBC 18.6 (*)    Hemoglobin 10.7 (*)    HCT 32.9 (*)    Neutro Abs 16.5 (*)  Abs Immature Granulocytes 0.42 (*)    All other components within normal limits  BLOOD GAS, VENOUS - Abnormal; Notable for the following components:   pH, Ven 7.44 (*)    pCO2, Ven 37 (*)    pO2, Ven <31 (*)    All other components within normal limits  MAGNESIUM  - Abnormal; Notable for the following components:   Magnesium  1.2 (*)    All other components within normal limits  I-STAT CG4 LACTIC ACID, ED - Abnormal; Notable for the following components:   Lactic Acid, Venous 2.5 (*)    All other  components within normal limits  I-STAT CHEM 8, ED - Abnormal; Notable for the following components:   Sodium 134 (*)    BUN 40 (*)    Creatinine, Ser 2.80 (*)    Calcium , Ion 1.02 (*)    TCO2 21 (*)    Hemoglobin 11.2 (*)    HCT 33.0 (*)    All other components within normal limits  RESP PANEL BY RT-PCR (RSV, FLU A&B, COVID)  RVPGX2  CULTURE, BLOOD (ROUTINE X 2)  CULTURE, BLOOD (ROUTINE X 2)  URINALYSIS, W/ REFLEX TO CULTURE (INFECTION SUSPECTED)    EKG None  Radiology DG Chest Port 1 View Result Date: 09/02/2023 CLINICAL DATA:  Sepsis, low back pain, lethargy, breast cancer EXAM: PORTABLE CHEST 1 VIEW COMPARISON:  05/09/2023 FINDINGS: Right IJ central venous catheter tip: Right atrium. Asymmetric distribution of chest/breast soft tissues and of the breast implants. Even accounting for this there is new consolidation in the retrocardiac region suspicious for pneumonia in this clinical context. There is only some faint linear subsegmental atelectasis or scarring in the included portion of the left lower lobe 5 days ago on the CT abdomen. The right lung appears clear. Bilateral axillary clips noted. Heart size within normal limits. No blunting of the costophrenic angles. IMPRESSION: 1. New consolidation in the retrocardiac region suspicious for left lower lobe pneumonia in this clinical context. Assessment complicated by asymmetric distribution of breast and breast implant soft tissues. 2. Right IJ central venous catheter tip: Right atrium. Electronically Signed   By: Freida Jes M.D.   On: 09/02/2023 15:20    Procedures Procedures    Medications Ordered in ED Medications  lactated ringers  infusion ( Intravenous New Bag/Given 09/02/23 1453)  lactated ringers  bolus 1,000 mL (1,000 mLs Intravenous New Bag/Given 09/02/23 1451)    And  lactated ringers  bolus 500 mL (has no administration in time range)    And  lactated ringers  bolus 250 mL (has no administration in time range)   metroNIDAZOLE  (FLAGYL ) IVPB 500 mg (500 mg Intravenous New Bag/Given 09/02/23 1459)  midodrine  (PROAMATINE ) tablet 5 mg (5 mg Oral Given 09/02/23 1451)  norepinephrine  (LEVOPHED ) 4mg  in (0.016 mg/mL) premix infusion (has no administration in time range)  magnesium  sulfate IVPB 2 g 50 mL (has no administration in time range)  potassium chloride  SA (KLOR-CON  M) CR tablet 40 mEq (has no administration in time range)  ceFEPIme  (MAXIPIME ) 2 g in sodium chloride  0.9 % 100 mL IVPB (2 g Intravenous New Bag/Given 09/02/23 1455)  acetaminophen  (TYLENOL ) tablet 650 mg (650 mg Oral Given 09/02/23 1451)    ED Course/ Medical Decision Making/ A&P                                 Medical Decision Making Amount and/or Complexity of Data Reviewed Labs: ordered. Radiology: ordered.  Risk OTC drugs. Prescription drug management.   This patient presents to the ED for concern of fatigue, flank pain, this involves an extensive number of treatment options, and is a complaint that carries with it a high risk of complications and morbidity.  The differential diagnosis includes pneumonia, PE, nephrolithiasis, UTI, intra-abdominal infection   Co morbidities / Chronic conditions that complicate the patient evaluation  Crohn's disease, bowel necrosis s/p colostomy placement, short gut syndrome, breast cancer, anemia, PE   Additional history obtained:  Additional history obtained from EMR External records from outside source obtained and reviewed including EMS   Lab Tests:  I Ordered, and personally interpreted labs.  The pertinent results include: Leukocytosis and lactic acidosis consistent with sepsis; increased creatinine from baseline, hypomagnesemia and hypokalemia   Imaging Studies ordered:  I ordered imaging studies including chest x-ray, CTA chest, CT of abdomen and pelvis I independently visualized and interpreted imaging which showed (pending at time of signout) I agree with the  radiologist interpretation   Cardiac Monitoring: / EKG:  The patient was maintained on a cardiac monitor.  I personally viewed and interpreted the cardiac monitored which showed an underlying rhythm of: Sinus rhythm   Problem List / ED Course / Critical interventions / Medication management  Patient presenting for fatigue since yesterday in addition to left lateral posterior chest wall pain, cough.  Patient was hypotensive and tachycardic with EMS.  She received small mount of IV fluid prior to arrival.  On exam, she is alert and oriented.  She is tachycardic in the 150s.  She is found to have low-grade fever.  Septic workup and treatment were initiated.  Workup was initiated.  Home dose of midodrine  was ordered for blood pressure support.  Tylenol  was ordered for antipyresis.  Despite fluids and midodrine , patient had ongoing hypotension.  Levophed  was initiated.  Care of patient was signed out to oncoming ED provider. I ordered medication including IV fluids and broad-spectrum antibiotics for empiric treatment of sepsis; Tylenol  for antipyresis; midodrine  and Levophed  for hypotension Reevaluation of the patient after these medicines showed that the patient improved I have reviewed the patients home medicines and have made adjustments as needed  Social Determinants of Health:  Has access to outpatient care  CRITICAL CARE Performed by: Iva Mariner   Total critical care time: 31 minutes  Critical care time was exclusive of separately billable procedures and treating other patients.  Critical care was necessary to treat or prevent imminent or life-threatening deterioration.  Critical care was time spent personally by me on the following activities: development of treatment plan with patient and/or surrogate as well as nursing, discussions with consultants, evaluation of patient's response to treatment, examination of patient, obtaining history from patient or surrogate, ordering and  performing treatments and interventions, ordering and review of laboratory studies, ordering and review of radiographic studies, pulse oximetry and re-evaluation of patient's condition.         Final Clinical Impression(s) / ED Diagnoses Final diagnoses:  Sepsis, due to unspecified organism, unspecified whether acute organ dysfunction present [facility])  AKI (acute kidney injury) [facility])    Rx / DC Orders ED Discharge Orders     None           Iva Mariner, MD 09/02/23 1547

## 2023-09-02 NOTE — ED Notes (Signed)
 Dr. Kermit Ped notified about patients BP.

## 2023-09-02 NOTE — ED Notes (Signed)
 Unsuccessful stick for 2nd set of blood cultures. Will administer antibiotics.

## 2023-09-02 NOTE — Progress Notes (Signed)
 Pharmacy Antibiotic Note  [name] is a 48 y.o. female admitted on 09/02/2023 with sepsis/PNA.  Pharmacy has been consulted for Vanco, Cefepime  dosing.  Active Problem(s): left chest pain, tachycardia and fever, noted to be hypotensive.   ID: Sepsis, PNA on CXR, Immunocompromised (but not on chemo)  Tmax 100.9, WBC 18.6, Scr 2.8 LA 2.5,   Vanco 5/25>> Cefepime  5/25>> Micafungin  5/25>>  5/25: Resp panel: negative  Plan:  - Micafungin  150mg  IV q24h until blood cultures negative - Cefepime  2g IV q24h - Vancomycin  1000 mg IV Q 48h hrs. Goal AUC 400-550. Expected AUC: 560 SCr used: 2.8    Height: 5\' 4"  (162.6 cm) Weight: 54.9 kg (121 lb) IBW/kg (Calculated) : 54.7  Temp (24hrs), Avg:100.5 F (38.1 C), Min:100 F (37.8 C), Max:100.9 F (38.3 C)  Recent Labs  Lab 09/02/23 1443 09/02/23 1450 09/02/23 1451  WBC 18.6*  --   --   CREATININE 2.47*  --  2.80*  LATICACIDVEN  --  2.5*  --     Estimated Creatinine Clearance: 21.9 mL/min (A) (by C-G formula based on SCr of 2.8 mg/dL (H)).    Allergies  Allergen Reactions   Nsaids Other (See Comments)    Crohns disease/ IBD   Vancomycin  Itching   Chlorhexidine  Gluconate Itching    Able to use CHG swabs for ports, reaction with direct skin contact   Hydromorphone  Other (See Comments)    Confusion/sedation   Lactose Other (See Comments)    Upset stomach/diarrhea   Lactose Intolerance (Gi) Other (See Comments)    Upset stomach/diarrhea    Alta Goding Darcel Early, PharmD, BCPS Clinical Staff Pharmacist  Enis Harsh Stillinger 09/02/2023 7:25 PM

## 2023-09-02 NOTE — Progress Notes (Signed)
eLINK following for sepsis protocol. ?

## 2023-09-02 NOTE — ED Provider Notes (Signed)
 Received signout on; see prior team's note for full HPI.  Signed out pending CT scan.  Admitted for sepsis secondary to pneumonia on vasopressors. Bp improving. CT scan with multifocal pneumonia and mets to liver.  Consult placed to ICU for admission.   Rolinda Climes, DO 09/02/23 1807

## 2023-09-02 NOTE — ED Notes (Signed)
 Patient transported to CT

## 2023-09-02 NOTE — H&P (Signed)
 NAME:  [name], MRN:  [id number], DOB:  Aug 23, 1975, LOS: 0 ADMISSION DATE:  09/02/2023, CONSULTATION DATE:  09/02/2023  REFERRING MD:  Dyke Glasser , CHIEF COMPLAINT: Hypotension, sepsis  History of Present Illness:  48 year old woman with metastatic breast cancer presented to the ED by EMS with left chest pain, tachycardia and fever, noted to be hypotensive.  At baseline she takes midodrine  due to chronic hypotension Chest x-ray showed left lower lobe consolidation Labs significant for lactate of 2.5, leukocytosis 18K, mild hyponatremia, hypokalemia, BUN/creatinine 40/2.8, magnesium  1.2 Respiratory panel negative for flu, RSV, coronavirus CT chest/abdomen/pelvis confirmed consolidation in the left lower lobe with groundglass opacities bilateral and hypodense lesions in the liver   Pertinent  Medical History  Breast cancer initially diagnosed 05/2020, ER positive, HER2 positive status post neoadjuvant therapy, bilateral mastectomy, chemotherapy, relapse in 03/2022 treated with trastuzumab  , now with liver mets, awaiting liver biopsy and PET scan.  -Crohn's disease status post extensive colon resection on TNA , short gut syndrome - Pulm embolism  Significant Hospital Events: Including procedures, antibiotic start and stop dates in addition to other pertinent events     Interim History / Subjective:  Started on Levophed  after fluid bolus in the ED  Objective    Blood pressure 98/68, pulse (!) 114, temperature 100 F (37.8 C), temperature source Oral, resp. rate 17, height 5\' 4"  (1.626 m), weight 54.9 kg, last menstrual period 04/24/2021, SpO2 95%.        Intake/Output Summary (Last 24 hours) at 09/02/2023 1837 Last data filed at 09/02/2023 1753 Gross per 24 hour  Intake 1943.21 ml  Output --  Net 1943.21 ml   Filed Weights   09/02/23 1428  Weight: 54.9 kg    Examination: Gen. Pleasant, well-nourished, in no distress, normal affect ENT - no pallor,icterus, no post  nasal drip Neck: No JVD, no thyromegaly, no carotid bruits , right IJ PICC line exit site appears clean Lungs: no use of accessory muscles, no dullness to percussion, clear without rales or rhonchi  Cardiovascular: Rhythm regular, heart sounds  normal, no murmurs or gallops, no peripheral edema Abdomen: soft and non-tender, no hepatosplenomegaly, BS normal, ileostomy bag without output Musculoskeletal: No deformities, no cyanosis or clubbing Neuro:  alert, non focal   Resolved problem list   Assessment and Plan   Septic shock -due to left lower lobe pneumonia, not on cancer treatment currently but consider immunocompromised Chronic hypotension on midodrine  - Levophed  to MAP 65, she has Port-A-Cath so can use higher doses if needed - Treat with cefepime  vancomycin  - Obtain urine strep antigen, Legionella for completion  AKI -IV fluids, monitor renal function, avoid nephrotoxins  Severe hypomagnesemia will be repleted  Breast cancer with new findings of liver metastasis -PET scan and liver biopsy has been planned by her primary oncologist Dr. Alda Amas gut syndrome on chronic TNA -continue. Will add micafungin  to her antibiotic regimen until blood cultures negative   Best Practice (right click and "Reselect all SmartList Selections" daily)   Diet/type: Regular consistency (see orders) DVT prophylaxis prophylactic heparin   Pressure ulcer(s): N/A GI prophylaxis: N/A Lines: N/A Foley:  N/A Code Status:  full code Last date of multidisciplinary goals of care discussion [NA]  Labs   CBC: Recent Labs  Lab 09/02/23 1443 09/02/23 1451  WBC 18.6*  --   NEUTROABS 16.5*  --   HGB 10.7* 11.2*  HCT 32.9* 33.0*  MCV 81.4  --   PLT 195  --  Basic Metabolic Panel: Recent Labs  Lab 09/02/23 1443 09/02/23 1451  NA 132* 134*  K 3.4* 3.6  CL 98 98  CO2 23  --   GLUCOSE 90 90  BUN 44* 40*  CREATININE 2.47* 2.80*  CALCIUM  8.2*  --   MG 1.2*  --    GFR: Estimated  Creatinine Clearance: 21.9 mL/min (A) (by C-G formula based on SCr of 2.8 mg/dL (H)). Recent Labs  Lab 09/02/23 1443 09/02/23 1450  WBC 18.6*  --   LATICACIDVEN  --  2.5*    Liver Function Tests: Recent Labs  Lab 09/02/23 1443  AST 78*  ALT 55*  ALKPHOS 187*  BILITOT 4.0*  PROT 8.0  ALBUMIN  3.2*   No results for input(s): "LIPASE", "AMYLASE" in the last 168 hours. No results for input(s): "AMMONIA" in the last 168 hours.  ABG    Component Value Date/Time   PHART 7.47 (H) 12/18/2022 0328   PCO2ART 29 (L) 12/18/2022 0328   PO2ART 121 (H) 12/18/2022 0328   HCO3 25.1 09/02/2023 1443   TCO2 21 (L) 09/02/2023 1451   ACIDBASEDEF 1.5 12/18/2022 0328   O2SAT 20.6 09/02/2023 1443     Coagulation Profile: No results for input(s): "INR", "PROTIME" in the last 168 hours.  Cardiac Enzymes: No results for input(s): "CKTOTAL", "CKMB", "CKMBINDEX", "TROPONINI" in the last 168 hours.  HbA1C: Hgb A1c MFr Bld  Date/Time Value Ref Range Status  12/13/2022 06:45 AM 5.7 (H) 4.8 - 5.6 % Final    Comment:    (NOTE) Pre diabetes:          5.7%-6.4%  Diabetes:              >6.4%  Glycemic control for   <7.0% adults with diabetes     CBG: No results for input(s): "GLUCAP" in the last 168 hours.  Review of Systems:   Left-sided chest pain No fevers Cough URI symptoms -36-year-old grandchild was sick  Past Medical History:  She,  has a past medical history of Breast cancer (HCC), Colon stricture (HCC) (05/07/2019), Colonic stricture (HCC) (11/19/2022), Crohn's colitis, with intestinal obstruction (HCC) (09/17/2013), Crohn's disease (HCC), Exacerbation of Crohn's disease of large intestine (HCC) (12/13/2022), Family history of breast cancer (06/11/2020), History of abdominal subtotal colectomy (11/19/2022), 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 (10/17/2010), Rectal bleeding, Uterine fibroid, and Vitamin D  deficiency (09/24/2013).   Surgical  History:   Past Surgical History:  Procedure Laterality Date   AXILLARY LYMPH NODE DISSECTION Right 11/09/2020   Procedure: RIGHT AXILLARY LYMPH NODE DISSECTION;  Surgeon: Enid Harry, MD;  Location: [facility];  Service: General;  Laterality: Right;   BIOPSY  07/01/2020   Procedure: BIOPSY;  Surgeon: Ace Holder, MD;  Location: [facility];  Service: Gastroenterology;;   BIOPSY  01/27/2021   Procedure: BIOPSY;  Surgeon: Ace Holder, MD;  Location: [facility];  Service: Gastroenterology;;   BIOPSY  09/07/2021   Procedure: BIOPSY;  Surgeon: Lajuan Pila, MD;  Location: [facility];  Service: Gastroenterology;;   BIOPSY  11/21/2022   Procedure: BIOPSY;  Surgeon: Normie Becton., MD;  Location: [facility];  Service: Gastroenterology;;   BREAST BIOPSY Right 03/24/2022   US  RT BREAST BX W LOC DEV 1ST LESION IMG BX SPEC US  GUIDE 03/24/2022 [facility]   BREAST BIOPSY  04/28/2022   US  RT RADIOACTIVE SEED LOC 04/28/2022 [facility]   BREAST IMPLANT REMOVAL Right 08/07/2022   Procedure: REMOVAL  OF RIGHT  BREAST IMPLANTS;  Surgeon: Barb Bonito, MD;  Location: [facility];  Service: Plastics;  Laterality: Right;   BREAST RECONSTRUCTION WITH PLACEMENT OF TISSUE EXPANDER AND FLEX HD (ACELLULAR HYDRATED DERMIS) Bilateral 11/09/2020   Procedure: BILATERAL BREAST RECONSTRUCTION WITH PLACEMENT OF TISSUE EXPANDER AND FLEX HD (ACELLULAR HYDRATED DERMIS);  Surgeon: Barb Bonito, MD;  Location: [facility];  Service: Plastics;  Laterality: Bilateral;   CAPSULOTOMY Bilateral 07/25/2021   Procedure: CAPSULOTOMY;  Surgeon: Barb Bonito, MD;  Location: [facility];  Service: Plastics;  Laterality: Bilateral;   COLECTOMY  06/27/2019   COLONOSCOPY  2015   ESOPHAGOGASTRODUODENOSCOPY (EGD) WITH PROPOFOL  N/A 11/21/2022   Procedure: ESOPHAGOGASTRODUODENOSCOPY (EGD) WITH PROPOFOL ;  Surgeon: Brice Campi Albino Alu., MD;  Location: [facility];  Service: Gastroenterology;  Laterality: N/A;   FLEXIBLE SIGMOIDOSCOPY N/A 07/01/2020   Procedure: FLEXIBLE SIGMOIDOSCOPY;  Surgeon: Ace Holder, MD;  Location: [facility];  Service: Gastroenterology;  Laterality: N/A;   FLEXIBLE SIGMOIDOSCOPY N/A 01/27/2021   Procedure: FLEXIBLE SIGMOIDOSCOPY;  Surgeon: Ace Holder, MD;  Location: [facility];  Service: Gastroenterology;  Laterality: N/A;   FLEXIBLE SIGMOIDOSCOPY N/A 09/07/2021   Procedure: FLEXIBLE SIGMOIDOSCOPY;  Surgeon: Lajuan Pila, MD;  Location: [facility];  Service: Gastroenterology;  Laterality: N/A;   FLEXIBLE SIGMOIDOSCOPY N/A 11/21/2022   Procedure: FLEXIBLE SIGMOIDOSCOPY;  Surgeon: Brice Campi Albino Alu., MD;  Location: [facility];  Service: Gastroenterology;  Laterality: N/A;   IR CATHETER TUBE CHANGE  03/07/2023   IR FLUORO GUIDE CV LINE RIGHT  01/09/2023   IR PATIENT EVAL TECH 0-60 MINS  04/24/2023   IR RADIOLOGIST EVAL & MGMT  04/13/2023   IR RADIOLOGIST EVAL & MGMT  05/07/2023   IR US  GUIDE VASC ACCESS RIGHT  01/09/2023   LAPAROTOMY N/A 12/14/2022   Procedure: EXPLORATORY LAPAROTOMY WITH SMALL BOWEL RESECTION;  Surgeon: Cannon Champion, MD;  Location: [facility];  Service: General;  Laterality: N/A;   LAPAROTOMY N/A 12/16/2022   Procedure: REOPENING LAPAROTOMY, ILEOSTOMY;  ABDOMINAL CLOSURE;  Surgeon: Cannon Champion, MD;  Location: [facility];  Service: General;  Laterality: N/A;   NIPPLE SPARING MASTECTOMY Bilateral 11/09/2020   Procedure: BILATERAL NIPPLE SPARING MASTECTOMY;  Surgeon: Enid Harry, MD;  Location: [facility];  Service: General;  Laterality: Bilateral;   PORT-A-CATH REMOVAL N/A 07/25/2021   Procedure: REMOVAL PORT-A-CATH;  Surgeon: Barb Bonito, MD;  Location: [facility];  Service: Plastics;  Laterality: N/A;   PORT-A-CATH REMOVAL Left 01/05/2023   Procedure: REMOVAL PORT-A-CATH;  Surgeon: Oza Blumenthal, MD;  Location: [facility];  Service: General;  Laterality: Left;   PORTACATH PLACEMENT N/A 06/23/2020   Procedure: INSERTION PORT-A-CATH;  Surgeon: Enid Harry, MD;  Location: [facility];  Service: General;  Laterality: N/A;  START TIME OF 11:00 AM FOR 60 MINUTES [place] [facility]   PORTACATH PLACEMENT Left 05/02/2022   Procedure: INSERTION PORT-A-CATH;  Surgeon: Enid Harry, MD;  Location: [facility];  Service: General;  Laterality: Left;   RADIOACTIVE SEED GUIDED EXCISIONAL BREAST BIOPSY Right 05/02/2022   Procedure: RADIOACTIVE SEED GUIDED RIGHT BREAST MASS EXCISION;  Surgeon: Enid Harry, MD;  Location: [facility];  Service: General;  Laterality: Right;   REMOVAL OF BILATERAL TISSUE EXPANDERS WITH PLACEMENT OF BILATERAL BREAST IMPLANTS Bilateral 07/25/2021   Procedure: REMOVAL OF BILATERAL TISSUE EXPANDERS WITH PLACEMENT OF BILATERAL BREAST IMPLANTS;  Surgeon: Barb Bonito, MD;  Location: [facility];  Service: Plastics;  Laterality: Bilateral;  1.5     Social History:   reports that she has quit smoking. Her smoking use included cigarettes. She has never used smokeless tobacco. She reports that she does not currently use alcohol after a past usage of about 1.0 - 2.0 standard drink of alcohol per week. She reports that she does not use drugs.   Family History:  Her family history includes Breast cancer in her cousin and maternal aunt; Diabetes in her father and paternal aunt. There is no history of Esophageal cancer, Rectal cancer, Stomach cancer, Colon polyps, or Colon cancer.   Allergies Allergies  Allergen Reactions   Nsaids Other (See Comments)    Crohns disease/ IBD   Vancomycin  Itching   Chlorhexidine  Gluconate Itching    Able to use CHG swabs for ports, reaction with direct skin contact   Hydromorphone  Other (See Comments)    Confusion/sedation   Lactose Other (See Comments)    Upset stomach/diarrhea   Lactose Intolerance  (Gi) Other (See Comments)    Upset stomach/diarrhea     Home Medications  Prior to Admission medications   Medication Sig Start Date End Date Taking? Authorizing Provider  midodrine  (PROAMATINE ) 5 MG tablet Take 1 tablet (5 mg total) by mouth 2 (two) times daily with a meal. 08/30/23   Cameron Cea, MD  sterile water SOLN with amino acids  10 % SOLN 1.3 g/kg, dextrose  70 % SOLN 20 % Inject into the vein continuous. Infuse TPN 3/1 1500 ml IV through PICC Line Via ambulatory infusion pump over 16 Hour with 1 Hour Ramp Up & 1 Hour Ramp Down 3 X Week (MWF). Add 10 ml MVI Daily Prior To Infusion. Bag Volume: 1550 ml  Base Formula           Amount/Day         Amount/L  Dextrose  70%          175.00 Gm           116.67 Gm Plenamine 15%        80.00 Gm             53.33 Gm SMOFlipid 20%        40.00 Gm             26.67 Gm  Electrolytes  Calcium  Glucona     6.00 meq              4.00 meq Magnesium  Sulf      8.00 meq               5.33 meq Sodium Chloride      230 meq                153.33 meq Potassium Phos     15 mM                    10.00 mM K Acetate                67.00 meq              44.67 meq  Vitamins, trace elements and medications Tralement               1.00 mL                   0.67 mL    [provider]     Critical care time: 53 m     Celene Coins MD. FCCP. [facility] Pager : [phone]  If no response to pager , please call [phone] until 7 pm After 7:00 pm call Elink  [phone]   09/02/2023

## 2023-09-02 NOTE — Progress Notes (Signed)
 eLink Physician-Brief Progress Note Patient Name: [name] DOB: 21-May-1975 MRN: [id number]   Date of Service  09/02/2023  HPI/Events of Note  22 F hx of breast cancer initially diagnosed 05/2020, ER positive, HER2 positive status post neoadjuvant therapy, bilateral mastectomy, chemotherapy, relapse in 03/2022 treated with trastuzumab  , now with liver mets, awaiting liver biopsy and PET scan. Also with Crohn's diseases s/p colectomy and PE. She comes in with signs and symptoms of LLL pneumonia seen on xray and confirmed on CT.  eICU Interventions  On vancomycin , cefepime  and micafungin  Given one dose of metronidazole  in the ED To LR ordere 40 and 150 cc. Claims she is able to eat and drink. Continued on the 40 cc/hr. Lactic acid decreasing. Discussed with BSRN     Intervention Category Evaluation Type: New Patient Evaluation  Turner Gains 09/02/2023, 10:14 PM

## 2023-09-03 DIAGNOSIS — E876 Hypokalemia: Secondary | ICD-10-CM

## 2023-09-03 DIAGNOSIS — A419 Sepsis, unspecified organism: Secondary | ICD-10-CM | POA: Diagnosis not present

## 2023-09-03 DIAGNOSIS — N179 Acute kidney failure, unspecified: Secondary | ICD-10-CM

## 2023-09-03 DIAGNOSIS — J159 Unspecified bacterial pneumonia: Secondary | ICD-10-CM

## 2023-09-03 DIAGNOSIS — J189 Pneumonia, unspecified organism: Secondary | ICD-10-CM | POA: Diagnosis not present

## 2023-09-03 DIAGNOSIS — R6521 Severe sepsis with septic shock: Secondary | ICD-10-CM | POA: Diagnosis not present

## 2023-09-03 LAB — CBC
HCT: 28.8 % — ABNORMAL LOW (ref 36.0–46.0)
Hemoglobin: 9.4 g/dL — ABNORMAL LOW (ref 12.0–15.0)
MCH: 26.7 pg (ref 26.0–34.0)
MCHC: 32.6 g/dL (ref 30.0–36.0)
MCV: 81.8 fL (ref 80.0–100.0)
Platelets: 177 10*3/uL (ref 150–400)
RBC: 3.52 MIL/uL — ABNORMAL LOW (ref 3.87–5.11)
RDW: 13.5 % (ref 11.5–15.5)
WBC: 22.9 10*3/uL — ABNORMAL HIGH (ref 4.0–10.5)
nRBC: 0 % (ref 0.0–0.2)

## 2023-09-03 LAB — BLOOD CULTURE ID PANEL (REFLEXED) - BCID2

## 2023-09-03 LAB — URINALYSIS, ROUTINE W REFLEX MICROSCOPIC
Bilirubin Urine: NEGATIVE
Glucose, UA: NEGATIVE mg/dL
Ketones, ur: NEGATIVE mg/dL
Nitrite: NEGATIVE
Protein, ur: 30 mg/dL — AB
Specific Gravity, Urine: 1.006 (ref 1.005–1.030)
pH: 6 (ref 5.0–8.0)

## 2023-09-03 LAB — BASIC METABOLIC PANEL WITH GFR
Anion gap: 10 (ref 5–15)
BUN: 26 mg/dL — ABNORMAL HIGH (ref 6–20)
CO2: 24 mmol/L (ref 22–32)
Calcium: 8.2 mg/dL — ABNORMAL LOW (ref 8.9–10.3)
Chloride: 101 mmol/L (ref 98–111)
Creatinine, Ser: 1.26 mg/dL — ABNORMAL HIGH (ref 0.44–1.00)
GFR, Estimated: 54 mL/min — ABNORMAL LOW (ref >60)
Glucose, Bld: 99 mg/dL (ref 70–99)
Potassium: 3.5 mmol/L (ref 3.5–5.1)
Sodium: 135 mmol/L (ref 135–145)

## 2023-09-03 LAB — PHOSPHORUS: Phosphorus: 2.1 mg/dL — ABNORMAL LOW (ref 2.5–4.6)

## 2023-09-03 LAB — STREP PNEUMONIAE URINARY ANTIGEN: Strep Pneumo Urinary Antigen: NEGATIVE

## 2023-09-03 LAB — MAGNESIUM: Magnesium: 2.6 mg/dL — ABNORMAL HIGH (ref 1.7–2.4)

## 2023-09-03 LAB — GLUCOSE, CAPILLARY
Glucose-Capillary: 157 mg/dL — ABNORMAL HIGH (ref 70–99)
Glucose-Capillary: 100 mg/dL — ABNORMAL HIGH (ref 70–99)
Glucose-Capillary: 137 mg/dL — ABNORMAL HIGH (ref 70–99)

## 2023-09-03 LAB — LACTIC ACID, PLASMA: Lactic Acid, Venous: 0.9 mmol/L (ref 0.5–1.9)

## 2023-09-03 MED ORDER — POTASSIUM PHOSPHATES 15 MMOLE/5ML IV SOLN
30.0000 mmol | Freq: Once | INTRAVENOUS | Status: AC
  Administered 2023-09-03: 30 mmol via INTRAVENOUS
  Filled 2023-09-03: qty 10

## 2023-09-03 MED ORDER — SODIUM CHLORIDE 0.9% FLUSH: 10.0000 mL | INTRAVENOUS | Status: DC | PRN

## 2023-09-03 MED ORDER — HYDROCODONE-ACETAMINOPHEN 5-325 MG PO TABS
1.0000 | ORAL_TABLET | Freq: Four times a day (QID) | ORAL | Status: DC | PRN
  Administered 2023-09-05: 1 via ORAL
  Filled 2023-09-03 (×2): qty 1

## 2023-09-03 MED ORDER — ACETAMINOPHEN 325 MG PO TABS
650.0000 mg | ORAL_TABLET | Freq: Four times a day (QID) | ORAL | Status: DC | PRN
  Administered 2023-09-04 – 2023-09-05 (×4): 650 mg via ORAL
  Filled 2023-09-03 (×4): qty 2

## 2023-09-03 MED ORDER — SODIUM CHLORIDE 0.9 % IV SOLN
2.0000 g | Freq: Two times a day (BID) | INTRAVENOUS | Status: DC
  Administered 2023-09-03 – 2023-09-07 (×8): 2 g via INTRAVENOUS
  Filled 2023-09-03 (×8): qty 12.5

## 2023-09-03 MED ORDER — KATE FARMS STANDARD 1.4 PO LIQD
325.0000 mL | Freq: Two times a day (BID) | ORAL | Status: DC
  Administered 2023-09-03 – 2023-09-08 (×9): 325 mL via ORAL
  Filled 2023-09-03 (×11): qty 325

## 2023-09-03 MED ORDER — VANCOMYCIN HCL IN DEXTROSE 1-5 GM/200ML-% IV SOLN
1000.0000 mg | INTRAVENOUS | Status: DC
  Administered 2023-09-03 – 2023-09-04 (×2): 1000 mg via INTRAVENOUS
  Filled 2023-09-03 (×2): qty 200

## 2023-09-03 MED ORDER — INSULIN ASPART 100 UNIT/ML IJ SOLN
0.0000 [IU] | Freq: Four times a day (QID) | INTRAMUSCULAR | Status: DC
  Administered 2023-09-04 (×2): 1 [IU] via SUBCUTANEOUS

## 2023-09-03 MED ORDER — ORAL CARE MOUTH RINSE: 15.0000 mL | OROMUCOSAL | Status: DC | PRN

## 2023-09-03 MED ORDER — SODIUM CHLORIDE 0.9% FLUSH
10.0000 mL | Freq: Two times a day (BID) | INTRAVENOUS | Status: DC
  Administered 2023-09-03 (×2): 10 mL
  Administered 2023-09-04: 40 mL
  Administered 2023-09-04: 20 mL
  Administered 2023-09-05: 10 mL
  Administered 2023-09-05: 20 mL
  Administered 2023-09-06 – 2023-09-08 (×4): 10 mL

## 2023-09-03 MED ORDER — INSULIN ASPART 100 UNIT/ML IJ SOLN: 0.0000 [IU] | Freq: Four times a day (QID) | INTRAMUSCULAR | Status: DC | PRN

## 2023-09-03 MED ORDER — MORPHINE SULFATE (PF) 2 MG/ML IV SOLN
1.0000 mg | Freq: Once | INTRAVENOUS | Status: AC | PRN
  Administered 2023-09-03: 1 mg via INTRAVENOUS
  Filled 2023-09-03: qty 1

## 2023-09-03 MED ORDER — TRAVASOL 10 % IV SOLN
INTRAVENOUS | Status: AC
Start: 2023-09-03 — End: 2023-09-04
  Filled 2023-09-03: qty 795

## 2023-09-03 MED ORDER — MIDODRINE HCL 5 MG PO TABS
10.0000 mg | ORAL_TABLET | Freq: Two times a day (BID) | ORAL | Status: DC
  Administered 2023-09-03 – 2023-09-04 (×3): 10 mg via ORAL
  Filled 2023-09-03 (×3): qty 2

## 2023-09-03 MED ORDER — TRAMADOL HCL 50 MG PO TABS
50.0000 mg | ORAL_TABLET | Freq: Four times a day (QID) | ORAL | Status: DC | PRN
  Administered 2023-09-03 (×2): 50 mg via ORAL
  Filled 2023-09-03 (×2): qty 1

## 2023-09-03 NOTE — Progress Notes (Signed)
 Initial Nutrition Assessment  DOCUMENTATION CODES:   Not applicable  INTERVENTION:  - Cyclic TPN to start tonight.   - TPN management per pharmacy.   - Regular diet.  - Add [employer] 1.4 PO BID, each supplement provides 455 kcal and 20 grams protein.  - Monitor weight trends.   NUTRITION DIAGNOSIS:   Impaired nutrient utilization related to altered GI function (short gut syndrome) as evidenced by other (comment) (need for TPN).  GOAL:   Patient will meet greater than or equal to 90% of their needs  MONITOR:   PO intake, Supplement acceptance, Labs, Weight trends, I & O's  REASON FOR ASSESSMENT:   Other (Comment) (Starting TPN per Pharmacist)    ASSESSMENT:   48 year old female with metastatic breast cancer (initially diagnosed 05/2020 and underwent neoadjuvant therapy, chemotherapy, and bilateral masectomy but relapsed in 03/2022 and now with liver mets awaiting liver biopsy), Crohn's disease s/p extensive colon resection with short gut syndrome who is on home TPN. Presented with left chest pain, tachycardia and fever, noted to be hypotensive. Admitted for septic shock.   RD working remotely. Called patient via bedside telephone to obtain nutrition history. Of note, patient well known to RD from previous admissions.  Patient endorses a UBW of 123# and that she has been gaining weight over the past 3-4 months. Per chart review, weights have varied widely over the past year. Patient noted to have had possible weight loss from January to February, was admitted during that time. However, weight has trended up since that time.   Patient confirms she gets TPN at hour, cycled over 14 hours. Per home meds list, patient receives 16 hours with 1 hour ramp up/down. Macros as below: Dextrose : 595 kcals Protein: 80g (320 kcals) Lipids: 360 kcals  = 1275 kcals  Patient also confirms she eats well at home in addition to TPN. Typically eats 4 meals a day including a snack. Eating  normally up until admission.   Discussed patient with pharmacist. Confirmed plan to restart TPN tonight, plan for cyclic as patient confirms she only wants cyclic.  *Please note, patient has short gut syndrome so she will require long-term TPN, regardless of how much she takes in orally.  Patient also on a regular diet. Appetite was decreased yesterday but is better today. She is agreeable to receive Johny Nap to support intake during admission.    Medications reviewed and include:  Levophed  @ 5 mcg/min  Labs reviewed:  Creatinine 1.26 Phosphorus 2.1 Magnesium  2.6   NUTRITION - FOCUSED PHYSICAL EXAM:  RD working remotely  Diet Order:   Diet Order             Diet regular Room service appropriate? Yes; Fluid consistency: Thin  Diet effective now                   EDUCATION NEEDS:  Education needs have been addressed  Skin:  Skin Assessment: Reviewed RN Assessment  Last BM:  5/25 - ostomy  Height:  Ht Readings from Last 1 Encounters:  09/02/23 5\' 4"  (1.626 m)   Weight:  Wt Readings from Last 1 Encounters:  09/03/23 56.1 kg    BMI:  Body mass index is 21.23 kg/m.  Estimated Nutritional Needs:  Kcal:  1650-1900 kcals Protein:  80-100 grams Fluid:  >/= 1.7L    Scheryl Cushing RD, LDN Contact via Secure Chat.

## 2023-09-03 NOTE — Plan of Care (Signed)
  Problem: Health Behavior/Discharge Planning: Goal: Ability to manage health-related needs will improve Outcome: Progressing   Problem: Clinical Measurements: Goal: Ability to maintain clinical measurements within normal limits will improve Outcome: Progressing Goal: Diagnostic test results will improve Outcome: Progressing Goal: Respiratory complications will improve Outcome: Progressing Goal: Cardiovascular complication will be avoided Outcome: Progressing   

## 2023-09-03 NOTE — Plan of Care (Signed)

## 2023-09-03 NOTE — Progress Notes (Addendum)
 PHARMACY - TOTAL PARENTERAL NUTRITION CONSULT NOTE   Indication: Short bowel syndrome  Patient Measurements: Height: 5\' 4"  (162.6 cm) Weight: 56.1 kg (123 lb 10.9 oz) IBW/kg (Calculated) : 54.7 TPN AdjBW (KG): 56.1 Body mass index is 21.23 kg/m. Usual Weight: 96 lbs on 05/17/23  Assessment:  [name] with metastatic breast cancer, new liver mets presenting to the ED with septic shock. Patient has a history of short gut syndrome, on TPN at home. Patient last admitted February 2025 and 1/31 TPN formula from [employer] faxed to [facility].   Glucose / Insulin : No history of DM CBGs 90 and 99 while admitted and off TPN Electrolytes: CoCa slightly low at 8.6, phos low at 2.1, Mg slightly elevated at 2.6, all other electrolytes WNL Renal: Scr 2.80 >> 1.26 from admission, BUN 40 >> 26 from admission Hepatic: 5/25 labs: albumin  low at 3.2, AST/ALT slightly elevated at 78/55, alk phos elevated at 187, T bili elevated at 4.0 Intake / Output; MIVF:  UOP: 300 mL/24 h No stool output charted GI Imaging:  5/25 CT a/p: scattered ground-glass opacities, multiple new hepatic mets, cholelithiasis, uterine fibroids  GI Surgeries / Procedures: N/A  Central access: PTA CVC double lumen in place TPN start date: resume 5/26  Nutritional Goals: - Per RD calculations based on patient's TPN on home med list, patient receiving 80g protein and 1275 kcals per day.  - Per patient discussion with RN, she takes lipids on Tues, Thurs, Sat, Sun, and then fats on Mon, Weds, Fri. - Will f/u with [employer] on 5/27 for specific TPN formula given today is a holiday.   RD Assessment: Estimated Needs Total Energy Estimated Needs: 1650-1900 kcals Total Protein Estimated Needs: 80-100 grams Total Fluid Estimated Needs: >/= 1.7L  Current Nutrition:  -Regular diet and TPN -[employer] feeding supplement ordered inpatient -Per patient discussion with RD, she eats well at home (this is likely why her home TPN does not  meet the calculated goals above)  Plan:  Now: Receiving Kphos 30 mmol IV x1   Resume cyclic TPN at 1800 1500 mL given over 16-hr cycle (1hr ramp-up/ramp-down) -  will provide 80g of protein and ~1215 Kcal/day. Electrolytes in TPN:  Na 75 mEq/L K 30 mEq/L Ca 5 mEq/L Mg 0 mEq/L Phos 8 mmol/L Cl:Ac 1:1 Add standard MVI and trace elements to TPN Initiate Sensitive q6h SSI and adjust as needed  Will stop LR infusion while on cyclic TPN Monitor TPN labs on Mon/Thurs, and PRN--full panel tomorrow   Roselee Cong, PharmD Clinical Pharmacist  5/26/202510:26 AM

## 2023-09-03 NOTE — Progress Notes (Signed)
 PHARMACY - PHYSICIAN COMMUNICATION CRITICAL VALUE ALERT - BLOOD CULTURE IDENTIFICATION (BCID)  [name] is an 48 y.o. female who presented to [facility] on 09/02/2023 with a chief complaint of left chest pain, tachycardia, and fever--diagnosed as septic shock due to LLL CAP.  Assessment: 1/3 Bcx bottles (anaerobic) growing GPC clusters, per BCID staph epi no resistance   Name of physician (or Provider) Contacted: Dr. Villa Greaser  Current antibiotics: cefepime , micafungin , vancomycin   Changes to prescribed antibiotics recommended:  Patient is on recommended antibiotics - No changes needed. Cefepime  and vancomycin  for pneumonia treatment (MRSA PCR positive). Micafungin  ordered given patient on long-term TPN outpatient for short bowel syndrome. Will continue to monitor and de-escalate as able.   Results for orders placed or performed during the hospital encounter of 09/02/23  Blood Culture ID Panel (Reflexed) (Collected: 09/02/2023  2:43 PM)  Result Value Ref Range   Enterococcus faecalis NOT DETECTED NOT DETECTED   Enterococcus Faecium NOT DETECTED NOT DETECTED   Listeria monocytogenes NOT DETECTED NOT DETECTED   Staphylococcus species DETECTED (A) NOT DETECTED   Staphylococcus aureus (BCID) NOT DETECTED NOT DETECTED   Staphylococcus epidermidis DETECTED (A) NOT DETECTED   Staphylococcus lugdunensis NOT DETECTED NOT DETECTED   Streptococcus species NOT DETECTED NOT DETECTED   Streptococcus agalactiae NOT DETECTED NOT DETECTED   Streptococcus pneumoniae NOT DETECTED NOT DETECTED   Streptococcus pyogenes NOT DETECTED NOT DETECTED   A.calcoaceticus-baumannii NOT DETECTED NOT DETECTED   Bacteroides fragilis NOT DETECTED NOT DETECTED   Enterobacterales NOT DETECTED NOT DETECTED   Enterobacter cloacae complex NOT DETECTED NOT DETECTED   Escherichia coli NOT DETECTED NOT DETECTED   Klebsiella aerogenes NOT DETECTED NOT DETECTED   Klebsiella oxytoca NOT DETECTED NOT DETECTED   Klebsiella  pneumoniae NOT DETECTED NOT DETECTED   Proteus species NOT DETECTED NOT DETECTED   Salmonella species NOT DETECTED NOT DETECTED   Serratia marcescens NOT DETECTED NOT DETECTED   Haemophilus influenzae NOT DETECTED NOT DETECTED   Neisseria meningitidis NOT DETECTED NOT DETECTED   Pseudomonas aeruginosa NOT DETECTED NOT DETECTED   Stenotrophomonas maltophilia NOT DETECTED NOT DETECTED   Candida albicans NOT DETECTED NOT DETECTED   Candida auris NOT DETECTED NOT DETECTED   Candida glabrata NOT DETECTED NOT DETECTED   Candida krusei NOT DETECTED NOT DETECTED   Candida parapsilosis NOT DETECTED NOT DETECTED   Candida tropicalis NOT DETECTED NOT DETECTED   Cryptococcus neoformans/gattii NOT DETECTED NOT DETECTED   Methicillin resistance mecA/C NOT DETECTED NOT DETECTED    Roselee Cong, PharmD Clinical Pharmacist  5/26/202510:01 AM

## 2023-09-03 NOTE — Progress Notes (Signed)
 eLink Physician-Brief Progress Note Patient Name: [name] DOB: 1975-10-11 MRN: [id number]   Date of Service  09/03/2023  HPI/Events of Note  Pt said she is concerned about the color of her urine and wants a urinalysis done. Also patient refused Micafungin  medication  Septic shock. On levo.    eICU Interventions  ordered     Intervention Category Minor Interventions: Other:  Rexann Catalan 09/03/2023, 8:47 PM  4:26 AM K+ 3.2, Mag 1.7, Phos 1.8 Crt 1.09  Patient on TPN - replacement ordered.

## 2023-09-03 NOTE — Progress Notes (Signed)
 NAME:  COBY ANTROBUS, MRN:  [id number], DOB:  December 07, 1975, LOS: 1 ADMISSION DATE:  09/02/2023, CONSULTATION DATE:  09/03/2023  REFERRING MD:  Dyke Glasser , CHIEF COMPLAINT: Hypotension, sepsis  History of Present Illness:  48 year old woman with metastatic breast cancer presented to the ED by EMS with left chest pain, tachycardia and fever, noted to be hypotensive.  At baseline she takes midodrine  due to chronic hypotension Chest x-ray showed left lower lobe consolidation Labs significant for lactate of 2.5, leukocytosis 18K, mild hyponatremia, hypokalemia, BUN/creatinine 40/2.8, magnesium  1.2 Respiratory panel negative for flu, RSV, coronavirus CT chest/abdomen/pelvis confirmed consolidation in the left lower lobe with groundglass opacities bilateral and hypodense lesions in the liver  Started on Levophed  after fluid bolus in the ED   Pertinent  Medical History  Breast cancer initially diagnosed 05/2020, ER positive, HER2 positive status post neoadjuvant therapy, bilateral mastectomy, chemotherapy, relapse in 03/2022 treated with trastuzumab  , now with liver mets, awaiting liver biopsy and PET scan.  -Crohn's disease status post extensive colon resection on TNA , short gut syndrome - Pulm embolism  Significant Hospital Events: Including procedures, antibiotic start and stop dates in addition to other pertinent events     Interim History / Subjective:  Afebrile On 5 mics of Levophed  Complains of pain on deep inspiration  Objective    Blood pressure 117/66, pulse (!) 113, temperature 98.1 F (36.7 C), temperature source Oral, resp. rate (!) 33, height 5\' 4"  (1.626 m), weight 56.1 kg, last menstrual period 04/24/2021, SpO2 96%.        Intake/Output Summary (Last 24 hours) at 09/03/2023 0827 Last data filed at 09/03/2023 0820 Gross per 24 hour  Intake 3329.79 ml  Output 300 ml  Net 3029.79 ml   Filed Weights   09/02/23 1428 09/02/23 2050 09/03/23 0500  Weight: 54.9 kg 56.1 kg  56.1 kg    Examination: Gen. Pleasant, well-nourished, in no distress, normal affect ENT - no pallor,icterus, no post nasal drip Neck: No JVD, no thyromegaly, no carotid bruits , right IJ PICC line exit site appears clean Lungs: Decreased breath sounds left base, no crackles, no accessory muscle use Cardiovascular: S1-S2 regular, no murmur, no edema Abdomen: soft and non-tender, no hepatosplenomegaly, BS normal, ileostomy bag without output Musculoskeletal: No deformities, no cyanosis or clubbing Neuro:  alert, non focal   Labs show hypokalemia, hypophosphatemia, BUN/creatinine decreased to 26/1.2, increased leukocytosis, slight drop in hemoglobin from 10.7-9.4  Resolved problem list   Assessment and Plan   Septic shock -due to left lower lobe community-acquired pneumonia, not on cancer treatment currently but consider immunocompromised, urine strep antigen neg Chronic hypotension on midodrine  - Levophed  to MAP 65 - Treat with cefepime  vancomycin  micafungin  due to TNA - Obtain  Legionella for completion - During admission for sepsis in February "ID was following due to bacteremia-growing  staph epidermidis from right antecubital fossa and staph also from the port. Initially not clear if it was back to bacteremia versus contamination but subsequently staph epidermidis has 2 different sensitivity patterns and per Dr. Ernie Heal this is consistent with contamination ,antibiotic discontinued " -  doubt this is related to PICC line, but await cultures   AKI -improving with IV fluids, monitor renal function, avoid nephrotoxins  Severe hypomagnesemia - repleted Hypokalemia hypophosphatemia will be repleted  Breast cancer with new findings of liver metastasis -PET scan and liver biopsy is planned by her primary oncologist Dr. Fernanda Howell  Crohn's disease, Short gut syndrome on chronic TNA -continue. Added  micafungin  to her antibiotic regimen until blood cultures negative   Best Practice  (right click and "Reselect all SmartList Selections" daily)   Diet/type: Regular consistency (see orders) DVT prophylaxis prophylactic heparin   Pressure ulcer(s): N/A GI prophylaxis: N/A Lines: N/A Foley:  N/A Code Status:  full code Last date of multidisciplinary goals of care discussion [NA]  Labs   CBC: Recent Labs  Lab 09/02/23 1443 09/02/23 1451 09/03/23 0541  WBC 18.6*  --  22.9*  NEUTROABS 16.5*  --   --   HGB 10.7* 11.2* 9.4*  HCT 32.9* 33.0* 28.8*  MCV 81.4  --  81.8  PLT 195  --  177    Basic Metabolic Panel: Recent Labs  Lab 09/02/23 1443 09/02/23 1451 09/03/23 0541  NA 132* 134* 135  K 3.4* 3.6 3.5  CL 98 98 101  CO2 23  --  24  GLUCOSE 90 90 99  BUN 44* 40* 26*  CREATININE 2.47* 2.80* 1.26*  CALCIUM  8.2*  --  8.2*  MG 1.2*  --  2.6*  PHOS  --   --  2.1*   GFR: Estimated Creatinine Clearance: 48.7 mL/min (A) (by C-G formula based on SCr of 1.26 mg/dL (H)). Recent Labs  Lab 09/02/23 1443 09/02/23 1450 09/02/23 2003 09/03/23 0541  WBC 18.6*  --   --  22.9*  LATICACIDVEN  --  2.5* 2.2*  --     Liver Function Tests: Recent Labs  Lab 09/02/23 1443  AST 78*  ALT 55*  ALKPHOS 187*  BILITOT 4.0*  PROT 8.0  ALBUMIN  3.2*   No results for input(s): "LIPASE", "AMYLASE" in the last 168 hours. No results for input(s): "AMMONIA" in the last 168 hours.  ABG    Component Value Date/Time   PHART 7.47 (H) 12/18/2022 0328   PCO2ART 29 (L) 12/18/2022 0328   PO2ART 121 (H) 12/18/2022 0328   HCO3 25.1 09/02/2023 1443   TCO2 21 (L) 09/02/2023 1451   ACIDBASEDEF 1.5 12/18/2022 0328   O2SAT 20.6 09/02/2023 1443     Coagulation Profile: No results for input(s): "INR", "PROTIME" in the last 168 hours.  Cardiac Enzymes: No results for input(s): "CKTOTAL", "CKMB", "CKMBINDEX", "TROPONINI" in the last 168 hours.  HbA1C: Hgb A1c MFr Bld  Date/Time Value Ref Range Status  12/13/2022 06:45 AM 5.7 (H) 4.8 - 5.6 % Final    Comment:    (NOTE) Pre  diabetes:          5.7%-6.4%  Diabetes:              >6.4%  Glycemic control for   <7.0% adults with diabetes     CBG: No results for input(s): "GLUCAP" in the last 168 hours.  Critical care time: 85 m     Celene Coins MD. Hazle Lites. [facility] Pager : [phone]  If no response to pager , please call [phone] until 7 pm After 7:00 pm call Elink  [phone]   09/03/2023

## 2023-09-03 NOTE — Progress Notes (Signed)
 PHARMACY NOTE:  ANTIMICROBIAL RENAL DOSAGE ADJUSTMENT  Current antimicrobial regimen includes a mismatch between antimicrobial dosage and estimated renal function.  As per policy approved by the Pharmacy & Therapeutics and Medical Executive Committees, the antimicrobial dosage will be adjusted accordingly.  Current antimicrobial dosage:  Vancomycin  1000mg  IV q48h Cefepime  2g IV q24h  Indication: septic shock secondary to CAP  Renal Function:  Estimated Creatinine Clearance: 48.7 mL/min (A) (by C-G formula based on SCr of 1.26 mg/dL (H)). []      On intermittent HD, scheduled: []      On CRRT    Antimicrobial dosage has been changed to:  Vancomycin  1000mg  IV q24h (eAUC 552 using Scr 1.26, Vd 0.72) Cefepime  2g IV q12h   Additional comments: N/A   Thank you for allowing pharmacy to be a part of this patient's care.  Roselee Cong, PharmD Clinical Pharmacist  5/26/202510:53 AM

## 2023-09-03 NOTE — Progress Notes (Signed)
 eLink Physician-Brief Progress Note Patient Name: [name] DOB: 18-Jul-1975 MRN: [id number]   Date of Service  09/03/2023  HPI/Events of Note  c/o 7/10 pain BSRN states [employer] did not afford relief Currently asleep but would have episodes when she would wake up in pain  eICU Interventions  Ordered a one time dose of morphine  1 mg     Intervention Category Intermediate Interventions: Pain - evaluation and management  Turner Gains 09/03/2023, 6:22 AM

## 2023-09-04 ENCOUNTER — Telehealth: Payer: Self-pay | Admitting: Hematology and Oncology

## 2023-09-04 ENCOUNTER — Inpatient Hospital Stay (HOSPITAL_COMMUNITY)

## 2023-09-04 DIAGNOSIS — R6521 Severe sepsis with septic shock: Secondary | ICD-10-CM | POA: Diagnosis not present

## 2023-09-04 DIAGNOSIS — I959 Hypotension, unspecified: Secondary | ICD-10-CM | POA: Diagnosis not present

## 2023-09-04 DIAGNOSIS — A419 Sepsis, unspecified organism: Secondary | ICD-10-CM | POA: Diagnosis not present

## 2023-09-04 LAB — CBC WITH DIFFERENTIAL/PLATELET
Abs Immature Granulocytes: 0.3 10*3/uL — ABNORMAL HIGH (ref 0.00–0.07)
Basophils Absolute: 0 10*3/uL (ref 0.0–0.1)
Basophils Relative: 0 %
Eosinophils Absolute: 0 10*3/uL (ref 0.0–0.5)
Eosinophils Relative: 0 %
HCT: 26.1 % — ABNORMAL LOW (ref 36.0–46.0)
Hemoglobin: 8.3 g/dL — ABNORMAL LOW (ref 12.0–15.0)
Immature Granulocytes: 2 %
Lymphocytes Relative: 7 %
Lymphs Abs: 1.2 10*3/uL (ref 0.7–4.0)
MCH: 26.5 pg (ref 26.0–34.0)
MCHC: 31.8 g/dL (ref 30.0–36.0)
MCV: 83.4 fL (ref 80.0–100.0)
Monocytes Absolute: 0.7 10*3/uL (ref 0.1–1.0)
Monocytes Relative: 4 %
Neutro Abs: 14.7 10*3/uL — ABNORMAL HIGH (ref 1.7–7.7)
Neutrophils Relative %: 87 %
Platelets: 179 10*3/uL (ref 150–400)
RBC: 3.13 MIL/uL — ABNORMAL LOW (ref 3.87–5.11)
RDW: 13.5 % (ref 11.5–15.5)
WBC: 16.9 10*3/uL — ABNORMAL HIGH (ref 4.0–10.5)
nRBC: 0 % (ref 0.0–0.2)

## 2023-09-04 LAB — COMPREHENSIVE METABOLIC PANEL WITH GFR
ALT: 35 U/L (ref 0–44)
AST: 36 U/L (ref 15–41)
Albumin: 2.5 g/dL — ABNORMAL LOW (ref 3.5–5.0)
Alkaline Phosphatase: 148 U/L — ABNORMAL HIGH (ref 38–126)
Anion gap: 8 (ref 5–15)
BUN: 20 mg/dL (ref 6–20)
CO2: 24 mmol/L (ref 22–32)
Calcium: 8.3 mg/dL — ABNORMAL LOW (ref 8.9–10.3)
Chloride: 99 mmol/L (ref 98–111)
Creatinine, Ser: 1.09 mg/dL — ABNORMAL HIGH (ref 0.44–1.00)
GFR, Estimated: >60 mL/min (ref >60)
Glucose, Bld: 148 mg/dL — ABNORMAL HIGH (ref 70–99)
Potassium: 3.2 mmol/L — ABNORMAL LOW (ref 3.5–5.1)
Sodium: 131 mmol/L — ABNORMAL LOW (ref 135–145)
Total Bilirubin: 3.8 mg/dL — ABNORMAL HIGH (ref 0.0–1.2)
Total Protein: 6.9 g/dL (ref 6.5–8.1)

## 2023-09-04 LAB — PHOSPHORUS: Phosphorus: 1.8 mg/dL — ABNORMAL LOW (ref 2.5–4.6)

## 2023-09-04 LAB — GLUCOSE, CAPILLARY
Glucose-Capillary: 103 mg/dL — ABNORMAL HIGH (ref 70–99)
Glucose-Capillary: 114 mg/dL — ABNORMAL HIGH (ref 70–99)
Glucose-Capillary: 147 mg/dL — ABNORMAL HIGH (ref 70–99)
Glucose-Capillary: 164 mg/dL — ABNORMAL HIGH (ref 70–99)

## 2023-09-04 LAB — LEGIONELLA PNEUMOPHILA SEROGP 1 UR AG: L. pneumophila Serogp 1 Ur Ag: NEGATIVE

## 2023-09-04 LAB — CORTISOL: Cortisol, Plasma: 12.2 ug/dL

## 2023-09-04 LAB — MAGNESIUM: Magnesium: 1.7 mg/dL (ref 1.7–2.4)

## 2023-09-04 MED ORDER — ADULT MULTIVITAMIN W/MINERALS CH
1.0000 | ORAL_TABLET | Freq: Every day | ORAL | Status: DC
  Administered 2023-09-04 – 2023-09-08 (×5): 1 via ORAL
  Filled 2023-09-04 (×5): qty 1

## 2023-09-04 MED ORDER — HYDROCORTISONE SOD SUC (PF) 100 MG IJ SOLR
100.0000 mg | Freq: Once | INTRAMUSCULAR | Status: AC
  Administered 2023-09-04: 100 mg via INTRAVENOUS
  Filled 2023-09-04: qty 2

## 2023-09-04 MED ORDER — GUAIFENESIN ER 600 MG PO TB12
1200.0000 mg | ORAL_TABLET | Freq: Two times a day (BID) | ORAL | Status: AC
  Administered 2023-09-04 – 2023-09-06 (×6): 1200 mg via ORAL
  Filled 2023-09-04 (×6): qty 2

## 2023-09-04 MED ORDER — MUPIROCIN 2 % EX OINT
1.0000 | TOPICAL_OINTMENT | Freq: Two times a day (BID) | CUTANEOUS | Status: DC
  Administered 2023-09-04 – 2023-09-07 (×7): 1 via NASAL
  Filled 2023-09-04 (×3): qty 22

## 2023-09-04 MED ORDER — MIDODRINE HCL 5 MG PO TABS
10.0000 mg | ORAL_TABLET | Freq: Three times a day (TID) | ORAL | Status: DC
  Administered 2023-09-04 – 2023-09-08 (×12): 10 mg via ORAL
  Filled 2023-09-04 (×13): qty 2

## 2023-09-04 MED ORDER — POTASSIUM PHOSPHATES 15 MMOLE/5ML IV SOLN
30.0000 mmol | Freq: Once | INTRAVENOUS | Status: AC
  Administered 2023-09-04: 30 mmol via INTRAVENOUS
  Filled 2023-09-04: qty 10

## 2023-09-04 MED ORDER — TRAVASOL 10 % IV SOLN
INTRAVENOUS | Status: AC
  Filled 2023-09-04: qty 900

## 2023-09-04 MED ORDER — MAGNESIUM SULFATE 2 GM/50ML IV SOLN
2.0000 g | Freq: Once | INTRAVENOUS | Status: AC
  Administered 2023-09-04: 2 g via INTRAVENOUS
  Filled 2023-09-04: qty 50

## 2023-09-04 NOTE — Progress Notes (Signed)
   09/04/23 0956  TOC Brief Assessment  Insurance and Status Reviewed  Patient has primary care physician Yes  Home environment has been reviewed single family home  Prior level of function: independent  Prior/Current Home Services No current home services  Social Drivers of Health Review SDOH reviewed no interventions necessary  Readmission risk has been reviewed Yes  Transition of care needs no transition of care needs at this time    [place], MSW, LCSW 09/04/2023 9:57 AM

## 2023-09-04 NOTE — Progress Notes (Signed)
 NAME:  [name], MRN:  [id number], DOB:  1975/07/13, LOS: 2 ADMISSION DATE:  09/02/2023, CONSULTATION DATE:  09/04/2023  REFERRING MD:  Dyke Glasser , CHIEF COMPLAINT: Hypotension, sepsis  History of Present Illness:  48 year old woman with metastatic breast cancer presented to the ED by EMS with left chest pain, tachycardia and fever, noted to be hypotensive.  At baseline she takes midodrine  due to chronic hypotension Chest x-ray showed left lower lobe consolidation Labs significant for lactate of 2.5, leukocytosis 18K, mild hyponatremia, hypokalemia, BUN/creatinine 40/2.8, magnesium  1.2 Respiratory panel negative for flu, RSV, coronavirus CT chest/abdomen/pelvis confirmed consolidation in the left lower lobe with groundglass opacities bilateral and hypodense lesions in the liver  Started on Levophed  after fluid bolus in the ED   Pertinent  Medical History  Breast cancer initially diagnosed 05/2020, ER positive, HER2 positive status post neoadjuvant therapy, bilateral mastectomy, chemotherapy, relapse in 03/2022 treated with trastuzumab  , now with liver mets, awaiting liver biopsy and PET scan.  -Crohn's disease status post extensive colon resection on TNA , short gut syndrome - Pulm embolism  Significant Hospital Events: Including procedures, antibiotic start and stop dates in addition to other pertinent events     Interim History / Subjective:  NAEON. Refused Micafungin  overnight. 2nd BCx with NGTD x 2 days. On 2mcg/min NE with MAP 80. No complaints besides being tired from little rest.  Objective    Blood pressure 105/73, pulse 98, temperature 97.9 F (36.6 C), temperature source Oral, resp. rate (!) 23, height 5\' 4"  (1.626 m), weight 57.7 kg, last menstrual period 04/24/2021, SpO2 95%.        Intake/Output Summary (Last 24 hours) at 09/04/2023 0944 Last data filed at 09/04/2023 7829 Gross per 24 hour  Intake 2770.34 ml  Output 1950 ml  Net 820.34 ml   Filed Weights    09/02/23 2050 09/03/23 0500 09/04/23 0500  Weight: 56.1 kg 56.1 kg 57.7 kg    Examination: General: Young adult female, resting in bed, in NAD. Neuro: A&O x 3, no deficits. HEENT: [place]/AT. Sclerae anicteric. EOMI. Cardiovascular: RRR, no M/R/G.  Lungs: Respirations even and unlabored.  CTA bilaterally, No W/R/R. Abdomen: Ileostomy bag with minimal output. BS x 4, soft, NT/ND.  Musculoskeletal: No gross deformities, no edema.    Assessment and Plan   Septic shock -due to left lower lobe community-acquired pneumonia, not on cancer treatment currently but consider immunocompromised, urine strep antigen neg Chronic hypotension on midodrine  - Continue Levophed  to MAP 65. - Continue PTA Midodrine , increase from 10mg  BID to 10mg  TID. - Continue cefepime  and vancomycin . - D/c Micafungin  as 2nd blood cultures NGTD. - Reorder Urine Legionella for completion. - During admission for sepsis in February "ID was following due to bacteremia-growing  staph epidermidis from right antecubital fossa and staph also from the port. Initially not clear if it was back to bacteremia versus contamination but subsequently staph epidermidis has 2 different sensitivity patterns and per Dr. Ernie Heal this is consistent with contamination ,antibiotic discontinued " -  doubt this is related to PICC line, but await cultures  AKI - improving with IV fluids Hypokalemia - s/p repletion. Hyponatremia - s/p repletion. Hypophosphatemia - s/p repletion. Hypomagnesemia - s/p repletion. - Continue gentle fluids. - Follow BMP.  Breast cancer with new findings of liver metastasis -PET scan and liver biopsy is planned by her primary oncologist Dr. Fernanda Howell.  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 - Continue TNA.   Best Practice (right click and "  Reselect all SmartList Selections" daily)   Diet/type: Regular consistency (see orders) DVT prophylaxis prophylactic heparin   Pressure ulcer(s): N/A GI prophylaxis:  N/A Lines: N/A Foley:  N/A Code Status:  full code Last date of multidisciplinary goals of care discussion [NA]  CC time: 30 min.   Rafael Bun, PA - [facility] For pager details, please see AMION or use Epic chat  After 1900, please call ELINK for cross coverage needs 09/04/2023, 10:06 AM

## 2023-09-04 NOTE — Plan of Care (Signed)
   Problem: Education: Goal: Knowledge of General Education information will improve Description Including pain rating scale, medication(s)/side effects and non-pharmacologic comfort measures Outcome: Progressing   Problem: Health Behavior/Discharge Planning: Goal: Ability to manage health-related needs will improve Outcome: Progressing

## 2023-09-04 NOTE — Progress Notes (Signed)
 PHARMACY - TOTAL PARENTERAL NUTRITION CONSULT NOTE   Indication: Short bowel syndrome  Patient Measurements: Height: 5\' 4"  (162.6 cm) Weight: 57.7 kg (127 lb 3.3 oz) IBW/kg (Calculated) : 54.7 TPN AdjBW (KG): 56.1 Body mass index is 21.83 kg/m. Usual Weight: 96 lbs on 05/17/23  Assessment:  [name] with metastatic breast cancer, new liver mets presenting to the ED with septic shock. Patient has a history of short gut syndrome, on TPN at home. Patient last admitted February 2025 and 1/31 TPN formula from [employer] faxed to [facility].   Glucose / Insulin : No history of DM - CBGs 100-148 in last 24 hrs - 2 units of insulin  used in last 24hrs Electrolytes: K low at 3.2, Na low at 131, phos low at 1.8, all other electrolytes WNL including CoCa at 9.3 Renal: BUN and Scr WNL Hepatic:  albumin  low at 2.5, LFTs WNL, alk phos elevated at 148, T bili elevated at 3.8 Intake / Output; MIVF:  UOP: 1550 mL/24 h Stool output: -400 mL/24 hrs GI Imaging:  5/25 CT chest/a/p: scattered ground-glass opacities, multiple new hepatic mets, cholelithiasis, uterine fibroids  GI Surgeries / Procedures: N/A  Central access: PTA CVC double lumen in place TPN start date: resume 5/26  Nutritional Goals: - Per TPN formula faxed over 5/27, patient receives 90g protein per day, 1389 Kcal/day, 1550 mL total volume (including additives)--TPN formula can be located in the Media tab under "[employer] TPN Formula". - Per formula, patient receives lipids on M, W, F and infuses 2-in-1 all other days of the week. She takes her daily MV orally.  RD Assessment: Estimated Needs Total Energy Estimated Needs: 1650-1900 kcals Total Protein Estimated Needs: 80-100 grams Total Fluid Estimated Needs: >/= 1.7L  Current Nutrition:  -Regular diet and TPN -[employer] feeding supplement ordered inpatient -Per patient discussion with RD, she eats well at home (this is likely why her home TPN does not meet the calculated goals  above)  Plan:  Now: Receiving Kphos 30 mmol IV x1  Received IV Mg 2g x1  Resume cyclic TPN at 1800 1500 mL given over 16-hr cycle (1hr ramp-up/ramp-down) -  will provide 90g of protein and ~1023 Kcal/day with NO lipid (only receives lipids on M, W, F). Electrolytes in TPN:  Na 120 mEq/L - increased K 50 mEq/L - increased Ca 5 mEq/L Mg 5 mEq/L - increased Phos 15 mmol/L - increased Cl:Ac 1:1 Add trace elements to TPN Initiate Sensitive q6h SSI and adjust as needed  Monitor TPN labs on Mon/Thurs, and PRN--full panel tomorrow Daily PO multivitamin    Roselee Cong, PharmD Clinical Pharmacist  5/27/202510:00 AM

## 2023-09-04 NOTE — Plan of Care (Signed)
  Problem: Education: Goal: Knowledge of General Education information will improve Description: Including pain rating scale, medication(s)/side effects and non-pharmacologic comfort measures Outcome: Progressing   Problem: Health Behavior/Discharge Planning: Goal: Ability to manage health-related needs will improve Outcome: Progressing   Problem: Clinical Measurements: Goal: Ability to maintain clinical measurements within normal limits will improve Outcome: Progressing Goal: Will remain free from infection Outcome: Progressing Goal: Diagnostic test results will improve Outcome: Progressing Goal: Respiratory complications will improve Outcome: Progressing Goal: Cardiovascular complication will be avoided Outcome: Progressing   Problem: Activity: Goal: Risk for activity intolerance will decrease Outcome: Progressing   Problem: Pain Managment: Goal: General experience of comfort will improve and/or be controlled Outcome: Progressing   Problem: Skin Integrity: Goal: Risk for impaired skin integrity will decrease Outcome: Progressing

## 2023-09-04 NOTE — Telephone Encounter (Signed)
 Left vm about scheduled telephone appt dates and times

## 2023-09-04 NOTE — Progress Notes (Signed)
 eLink Physician-Brief Progress Note Patient Name: [name] DOB: [birth year] MRN: [id number]   Date of Service  09/04/2023  HPI/Events of Note  Notified that patient is refusing prophylactic heparin   eICU Interventions  She was made aware by [facility] that this is to prevent blood clots and she expressed understanding but still refused She does have SCDs on and is ambulating to the bathroom     Intervention Category Intermediate Interventions: Other:  [name] 09/04/2023, 10:47 PM

## 2023-09-05 DIAGNOSIS — J09X1 Influenza due to identified novel influenza A virus with pneumonia: Secondary | ICD-10-CM | POA: Diagnosis not present

## 2023-09-05 DIAGNOSIS — R6521 Severe sepsis with septic shock: Secondary | ICD-10-CM | POA: Diagnosis not present

## 2023-09-05 DIAGNOSIS — A419 Sepsis, unspecified organism: Secondary | ICD-10-CM | POA: Diagnosis not present

## 2023-09-05 LAB — COMPREHENSIVE METABOLIC PANEL WITH GFR
ALT: 33 U/L (ref 0–44)
AST: 30 U/L (ref 15–41)
Albumin: 2.6 g/dL — ABNORMAL LOW (ref 3.5–5.0)
Alkaline Phosphatase: 158 U/L — ABNORMAL HIGH (ref 38–126)
Anion gap: 9 (ref 5–15)
BUN: 30 mg/dL — ABNORMAL HIGH (ref 6–20)
CO2: 26 mmol/L (ref 22–32)
Calcium: 8.9 mg/dL (ref 8.9–10.3)
Chloride: 104 mmol/L (ref 98–111)
Creatinine, Ser: 0.89 mg/dL (ref 0.44–1.00)
GFR, Estimated: >60 mL/min (ref >60)
Glucose, Bld: 124 mg/dL — ABNORMAL HIGH (ref 70–99)
Potassium: 4 mmol/L (ref 3.5–5.1)
Sodium: 139 mmol/L (ref 135–145)
Total Bilirubin: 2.7 mg/dL — ABNORMAL HIGH (ref 0.0–1.2)
Total Protein: 7.3 g/dL (ref 6.5–8.1)

## 2023-09-05 LAB — GLUCOSE, CAPILLARY
Glucose-Capillary: 105 mg/dL — ABNORMAL HIGH (ref 70–99)
Glucose-Capillary: 113 mg/dL — ABNORMAL HIGH (ref 70–99)
Glucose-Capillary: 126 mg/dL — ABNORMAL HIGH (ref 70–99)
Glucose-Capillary: 95 mg/dL (ref 70–99)

## 2023-09-05 LAB — PHOSPHORUS: Phosphorus: 2.6 mg/dL (ref 2.5–4.6)

## 2023-09-05 LAB — CULTURE, BLOOD (ROUTINE X 2): Special Requests: ADEQUATE

## 2023-09-05 LAB — MAGNESIUM: Magnesium: 2.3 mg/dL (ref 1.7–2.4)

## 2023-09-05 MED ORDER — TRAVASOL 10 % IV SOLN
INTRAVENOUS | Status: AC
  Filled 2023-09-05: qty 900

## 2023-09-05 MED ORDER — FLUDROCORTISONE ACETATE 0.1 MG PO TABS: 0.1000 mg | ORAL_TABLET | Freq: Every day | ORAL | Status: DC

## 2023-09-05 MED ORDER — FLUDROCORTISONE ACETATE 0.1 MG PO TABS
0.0500 mg | ORAL_TABLET | Freq: Every day | ORAL | Status: DC
  Administered 2023-09-05 – 2023-09-08 (×4): 0.05 mg via ORAL
  Filled 2023-09-05: qty 0.5
  Filled 2023-09-05 (×3): qty 1

## 2023-09-05 MED ORDER — VANCOMYCIN HCL 1.25 G IV SOLR
1250.0000 mg | INTRAVENOUS | Status: DC
  Filled 2023-09-05 (×2): qty 25

## 2023-09-05 NOTE — Plan of Care (Signed)

## 2023-09-05 NOTE — Progress Notes (Signed)
 PHARMACY NOTE:  ANTIMICROBIAL RENAL DOSAGE ADJUSTMENT  Current antimicrobial regimen includes a mismatch between antimicrobial dosage and estimated renal function.  As per policy approved by the Pharmacy & Therapeutics and Medical Executive Committees, the antimicrobial dosage will be adjusted accordingly.  Current antimicrobial dosage: vancomycin  1000mg  IV q24h   Indication: sepsis secondary to CAP (MRSA PCR +)  Renal Function:  Estimated Creatinine Clearance: 68.9 mL/min (by C-G formula based on SCr of 0.89 mg/dL). []      On intermittent HD, scheduled: []      On CRRT    Antimicrobial dosage has been changed to: vancomycin  1250mg  IV q24h (eAUC 488 using Scr 0.89, Vd 0.72)  Additional comments: N/A   Thank you for allowing pharmacy to be a part of this patient's care.  Roselee Cong, PharmD Clinical Pharmacist  5/28/20258:49 AM

## 2023-09-05 NOTE — Progress Notes (Signed)
 Pt refused dressing and distal lumen cap change. Distal lumen has no blood return. Dressing reinforced. Pt stated she was tired and  would allow dressing to be changed tomorrow. Primary nurse aware.

## 2023-09-05 NOTE — Progress Notes (Deleted)
 Pt refused dressing and distal lumen cap change. Distal lumen has no blood return. Pt stated she was tired and  allow dressing to be change tomorrow. Primary nurse aware.

## 2023-09-05 NOTE — Progress Notes (Signed)
 PHARMACY - TOTAL PARENTERAL NUTRITION CONSULT NOTE   Indication: Short bowel syndrome  Patient Measurements: Height: 5\' 4"  (162.6 cm) Weight: 57.7 kg (127 lb 3.3 oz) IBW/kg (Calculated) : 54.7 TPN AdjBW (KG): 56.1 Body mass index is 21.83 kg/m. Usual Weight: 96 lbs on 05/17/23  Assessment:  [name] with metastatic breast cancer, new liver mets presenting to the ED with septic shock. Patient has a history of short gut syndrome, on TPN at home. Patient last admitted February 2025 and 1/31 TPN formula from [employer] faxed to [facility].   Glucose / Insulin : No history of DM - CBGs 103-164 in last 24 hrs - 0 units of insulin  used in last 24hrs Electrolytes: Mg 2.3 (upper end of normal range), all other electrolytes WNL including CoCa of 9.9 Renal: Scr WNL, BUN 30 (elevated) Hepatic:  albumin  low at 2.6, LFTs WNL, alk phos elevated at 158, T bili elevated at 2.7 Intake / Output; MIVF:  UOP: -900 mL/24 h Stool output: -800 mL/24 hrs PO: pt drank two [employer] feeding supplements yesterday GI Imaging:  5/25 CT chest/a/p: scattered ground-glass opacities, multiple new hepatic mets, cholelithiasis, uterine fibroids  GI Surgeries / Procedures: N/A  Central access: PTA CVC double lumen in place TPN start date: resume 5/26  Nutritional Goals: - Per TPN formula faxed over 5/27, patient receives 90g protein per day, 1389 Kcal/day, 1550 mL total volume (including additives)--TPN formula can be located in the Media tab under "[employer] TPN Formula". - Per formula, patient receives lipids on M, W, F and infuses 2-in-1 all other days of the week. She takes her daily MV orally.  RD Assessment: Estimated Needs Total Energy Estimated Needs: 1650-1900 kcals Total Protein Estimated Needs: 80-100 grams Total Fluid Estimated Needs: >/= 1.7L  Current Nutrition:  -Regular diet and TPN -[employer] feeding supplement ordered inpatient -Per patient discussion with RD, she eats well at home  (this is likely why her home TPN does not meet the calculated goals above)  Plan:  Continue cyclic TPN at 1800 1500 mL given over 16-hr cycle (1hr ramp-up/ramp-down) -  will provide 90g of protein and ~1369 Kcal/day WITH lipid (only receives lipids on M, W, F). Electrolytes in TPN:  Na 90 mEq/L - decreased K 50 mEq/L Ca 4 mEq/L - decreased Mg 5 mEq/L  Phos 15 mmol/L Cl:Ac 1:1 Add trace elements to TPN Initiate Sensitive q6h SSI and adjust as needed  Monitor TPN labs on Mon/Thurs, and PRN--full panel tomorrow Daily PO multivitamin    Roselee Cong, PharmD Clinical Pharmacist  5/28/20258:09 AM

## 2023-09-05 NOTE — Progress Notes (Signed)
 NAME:  [name], MRN:  [id number], DOB:  May 16, 1975, LOS: 3 ADMISSION DATE:  09/02/2023, CONSULTATION DATE:  09/05/2023  REFERRING MD:  Dyke Glasser , CHIEF COMPLAINT: Hypotension, sepsis  History of Present Illness:  48 year old woman with metastatic breast cancer presented to the ED by EMS with left chest pain, tachycardia and fever, noted to be hypotensive.  At baseline she takes midodrine  due to chronic hypotension Chest x-ray showed left lower lobe consolidation Labs significant for lactate of 2.5, leukocytosis 18K, mild hyponatremia, hypokalemia, BUN/creatinine 40/2.8, magnesium  1.2 Respiratory panel negative for flu, RSV, coronavirus CT chest/abdomen/pelvis confirmed consolidation in the left lower lobe with groundglass opacities bilateral and hypodense lesions in the liver  Started on Levophed  after fluid bolus in the ED   Pertinent  Medical History  Breast cancer initially diagnosed 05/2020, ER positive, HER2 positive status post neoadjuvant therapy, bilateral mastectomy, chemotherapy, relapse in 03/2022 treated with trastuzumab  , now with liver mets, awaiting liver biopsy and PET scan.  -Crohn's disease status post extensive colon resection on TNA , short gut syndrome - Pulm embolism  Significant Hospital Events: Including procedures, antibiotic start and stop dates in addition to other pertinent events   09/02/2023 0  ADMIT BC - [facility] EPI in both aerobic and anaerobic bottles 5/27 - NAEON. Refused Micafungin  overnight. 2nd BCx with NGTD x 2 days. On 2mcg/min NE with MAP 80.  No complaints besides being tired from little rest. -> off pressors later in day but MAP 62 and SBP 80 -> cortisol sent and 1 dose HC given. Later ni PM - refused heparin  proioph  Interim History / Subjective:   5/28 - On TPN . Afebrile. Was febrile. Cortisol pre HC was 12.2 (in Jan 2025 it was 7.6).  Slept well. Walks to bathroom. No orthostatis dizziness. MAP now in 70s after HC   Objective     Blood pressure 123/76, pulse 72, temperature 98 F (36.7 C), temperature source Oral, resp. rate 19, height 5\' 4"  (1.626 m), weight 57.7 kg, last menstrual period 04/24/2021, SpO2 98%.        Intake/Output Summary (Last 24 hours) at 09/05/2023 0836 Last data filed at 09/05/2023 0107 Gross per 24 hour  Intake 1429.47 ml  Output 1700 ml  Net -270.53 ml   Filed Weights   09/02/23 2050 09/03/23 0500 09/04/23 0500  Weight: 56.1 kg 56.1 kg 57.7 kg    Examination: General: No distress. Looks well. In bed Neuro: Alert and Oriented x 3. GCS 15. Speech normal Psych: Pleasant Resp:  Barrel Chest - no.  Wheeze - no, Crackles - no, No overt respiratory distress CVS: Normal heart sounds. Murmurs - no Ext: Stigmata of Connective Tissue Disease - no ABD: Sopft. Has ostomy bag HEENT: Normal upper airway. PEERL +. No post nasal drip  Assessment and Plan   Circulatlry shock (sepsis and RAI)-due to left lower lobe community-acquired pneumonia, not on cancer treatment currently but consider immunocompromised, urine strep antigen neg RAI diagnosed Feb 2025 - low cortisol and came off pressors after HC -> discharged on Midodrine  RAI reeated diagnosis 09/04/23 - BP improved after 1 dose HC (cortisol 12)  Plan  - MAP goal > 65 - start Florinef 0.0.5mg  and adjstu up accordngly - continue midodrine  -dc levophe d off MAR   Severe sepsis du eto LLL PNEUMONIA this admit  - - During admission for CONFIRMED FLU A sepsis in February "ID was following due to bacteremia-growing  staph epidermidis from right antecubital fossa and staph  also from the port. Initially not clear if it was back to bacteremia versus contamination but subsequently staph epidermidis has 2 different sensitivity patterns and per Dr. Ernie Heal this is consistent with contamination ,antibiotic discontinued "  THIS ADMT  - MEW LLL CONSOLIDATION on CT /CXR (new in 8 days(  MRSA PCR positive. STaph Epi growing again in 1 set (both bottles) of  2. Patient had fever at admit . Urine strep/leg negtive  5/28 - afebrile  Plan  - NEED STOP DATE - 7 days. Pharmacy updated Anti-infectives (From admission, onward)    Start     Dose/Rate Route Frequency Ordered Stop   09/05/23 2000  Vancomycin  (VANCOCIN ) 1,250 mg in sodium chloride  0.9 % 250 mL IVPB        1,250 mg 166.7 mL/hr over 90 Minutes Intravenous Every 24 hours 09/05/23 0852     09/04/23 2000  vancomycin  (VANCOCIN ) IVPB 1000 mg/200 mL premix  Status:  Discontinued        1,000 mg 200 mL/hr over 60 Minutes Intravenous Every 48 hours 09/02/23 1924 09/03/23 1057   09/03/23 2000  vancomycin  (VANCOCIN ) IVPB 1000 mg/200 mL premix  Status:  Discontinued        1,000 mg 200 mL/hr over 60 Minutes Intravenous Every 24 hours 09/03/23 1057 09/05/23 0852   09/03/23 1500  ceFEPIme  (MAXIPIME ) 2 g in sodium chloride  0.9 % 100 mL IVPB  Status:  Discontinued        2 g 200 mL/hr over 30 Minutes Intravenous Every 24 hours 09/02/23 1920 09/03/23 1057   09/03/23 1200  ceFEPIme  (MAXIPIME ) 2 g in sodium chloride  0.9 % 100 mL IVPB        2 g 200 mL/hr over 30 Minutes Intravenous Every 12 hours 09/03/23 1057     09/02/23 2000  micafungin  (MYCAMINE ) 150 mg in sodium chloride  0.9 % 100 mL IVPB  Status:  Discontinued        150 mg 107.5 mL/hr over 1 Hours Intravenous Every 24 hours 09/02/23 1905 09/04/23 0945   09/02/23 1930  vancomycin  (VANCOCIN ) IVPB 1000 mg/200 mL premix        1,000 mg 200 mL/hr over 60 Minutes Intravenous STAT 09/02/23 1921 09/02/23 2050   09/02/23 1445  ceFEPIme  (MAXIPIME ) 2 g in sodium chloride  0.9 % 100 mL IVPB        2 g 200 mL/hr over 30 Minutes Intravenous  Once 09/02/23 1437 09/02/23 1548   09/02/23 1445  metroNIDAZOLE  (FLAGYL ) IVPB 500 mg        500 mg 100 mL/hr over 60 Minutes Intravenous  Once 09/02/23 1437 09/02/23 1550        AKI - improving with IV fluids  Resolved 09/05/23  Plan  monitoir  Hypokalemia - s/p repletion. Hyponatremia - s/p  repletion. Hypophosphatemia - s/p repletion. Hypomagnesemia - s/p repletion.  5/28 resolved  plan - Follow BMP.  Breast cancer with new findings of liver metastasis -PET scan and liver biopsy is planned by her primary oncologist Dr. Fernanda Howell.  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 - Continue TNA.   Best Practice (right click and "Reselect all SmartList Selections" daily)   Diet/type: Regular consistency (see orders) DVT prophylaxis prophylactic heparin   - >REFUSED - > Do SCD Pressure ulcer(s): N/A GI prophylaxis: N/A Lines: N/A Foley:  N/A Code Status:  full code Last date of multidisciplinary goals of care discussion [NA]   DISPO : move to med surg -> d/w [facility] they are primary  and CCM off from 09/06/23 d/w DR Shalhoub    ATTESTATION & SIGNATURE   Dr. Maire Scot, M.D., [facility].C.P Pulmonary and Critical Care Medicine Staff Physician,  System [facility] Pager: [phone], If no answer or between  15:00h - 7:00h: call [phone]  09/05/2023 8:51 AM     LABS    PULMONARY Recent Labs  Lab 09/02/23 1443 09/02/23 1451  HCO3 25.1  --   TCO2  --  21*  O2SAT 20.6  --     CBC Recent Labs  Lab 09/02/23 1443 09/02/23 1451 09/03/23 0541 09/04/23 0337  HGB 10.7* 11.2* 9.4* 8.3*  HCT 32.9* 33.0* 28.8* 26.1*  WBC 18.6*  --  22.9* 16.9*  PLT 195  --  177 179    COAGULATION No results for input(s): "INR" in the last 168 hours.  CARDIAC  No results for input(s): "TROPONINI" in the last 168 hours. No results for input(s): "PROBNP" in the last 168 hours.   CHEMISTRY Recent Labs  Lab 09/02/23 1443 09/02/23 1451 09/03/23 0541 09/04/23 0337 09/05/23 0656  NA 132* 134* 135 131* 139  K 3.4* 3.6 3.5 3.2* 4.0  CL 98 98 101 99 104  CO2 23  --  24 24 26   GLUCOSE 90 90 99 148* 124*  BUN 44* 40* 26* 20 30*  CREATININE 2.47* 2.80* 1.26* 1.09* 0.89  CALCIUM  8.2*  --  8.2* 8.3* 8.9  MG 1.2*  --  2.6*  1.7 2.3  PHOS  --   --  2.1* 1.8* 2.6   Estimated Creatinine Clearance: 68.9 mL/min (by C-G formula based on SCr of 0.89 mg/dL).   LIVER Recent Labs  Lab 09/02/23 1443 09/04/23 0337 09/05/23 0656  AST 78* 36 30  ALT 55* 35 33  ALKPHOS 187* 148* 158*  BILITOT 4.0* 3.8* 2.7*  PROT 8.0 6.9 7.3  ALBUMIN  3.2* 2.5* 2.6*     INFECTIOUS Recent Labs  Lab 09/02/23 1450 09/02/23 2003 09/03/23 1954  LATICACIDVEN 2.5* 2.2* 0.9     ENDOCRINE CBG (last 3)  Recent Labs    09/04/23 1756 09/04/23 2345 09/05/23 0702  GLUCAP 114* 164* 113*         IMAGING x48h  - image(s) personally visualized  -   highlighted in bold DG Chest Port 1 View Result Date: 09/04/2023 CLINICAL DATA:  Acute respiratory failure.\ 5626. EXAM: PORTABLE CHEST 1 VIEW COMPARISON:  Portable chest 09/02/2023, chest CT also 09/02/2023 FINDINGS: 4:45 a.m. Right IJ infusion catheter terminates in the upper right atrium. There is artifact from an overlying right breast prosthesis and a left breast implant. There are postsurgical changes in the left axilla, left chest wall. There is continued left lower lobe consolidation and a small left pleural effusion. Remaining lungs are clear. The cardiomediastinal silhouette and central vessels are normal. No new osseous findings. IMPRESSION: 1. Continued left lower lobe consolidation and small left pleural effusion. Overall aeration seems unchanged. 2. Right IJ infusion catheter terminates in the upper right atrium. Electronically Signed   By: Denman Fischer M.D.   On: 09/04/2023 06:57

## 2023-09-05 NOTE — Progress Notes (Signed)
 MD requested assistance with medication management for possible relative adrenal insufficiency.   Cortisol level 12, however, this was collected 5/27 afternoon and highest cortisol levels are in the morning, so could be slightly higher. Patient has struggled with hypotension throughout this admission as well. Similar admission in February and patient's BP improved after hydrocortisone  administration, she came off of pressors soon after and was discharged with midodrine . Patient received a dose of hydrocortisone  100mg  IV x1 on 5/27 and BP improved.   Given concern for relative adrenal insufficiency, fludrocortisone 0.05mg  daily ordered. Fludrocortisone is primarily used for primary adrenal insufficiency. Glucocorticoid therapy is recommended for both primary and central adrenal insufficiency cases, so I suggested hydrocortisone  5mg  PO TID as well.    Thank you for allowing pharmacy to be part of this patient's care  Roselee Cong, PharmD Clinical Pharmacist  5/28/202510:20 AM

## 2023-09-06 DIAGNOSIS — A419 Sepsis, unspecified organism: Secondary | ICD-10-CM

## 2023-09-06 DIAGNOSIS — R6521 Severe sepsis with septic shock: Secondary | ICD-10-CM | POA: Diagnosis not present

## 2023-09-06 LAB — COMPREHENSIVE METABOLIC PANEL WITH GFR
ALT: 32 U/L (ref 0–44)
AST: 30 U/L (ref 15–41)
Albumin: 2.7 g/dL — ABNORMAL LOW (ref 3.5–5.0)
Alkaline Phosphatase: 203 U/L — ABNORMAL HIGH (ref 38–126)
Anion gap: 10 (ref 5–15)
BUN: 30 mg/dL — ABNORMAL HIGH (ref 6–20)
CO2: 21 mmol/L — ABNORMAL LOW (ref 22–32)
Calcium: 8.6 mg/dL — ABNORMAL LOW (ref 8.9–10.3)
Chloride: 105 mmol/L (ref 98–111)
Creatinine, Ser: 1.08 mg/dL — ABNORMAL HIGH (ref 0.44–1.00)
GFR, Estimated: >60 mL/min (ref >60)
Glucose, Bld: 102 mg/dL — ABNORMAL HIGH (ref 70–99)
Potassium: 4.2 mmol/L (ref 3.5–5.1)
Sodium: 136 mmol/L (ref 135–145)
Total Bilirubin: 1.8 mg/dL — ABNORMAL HIGH (ref 0.0–1.2)
Total Protein: 7.2 g/dL (ref 6.5–8.1)

## 2023-09-06 LAB — CBC
HCT: 25.3 % — ABNORMAL LOW (ref 36.0–46.0)
Hemoglobin: 8 g/dL — ABNORMAL LOW (ref 12.0–15.0)
MCH: 27.9 pg (ref 26.0–34.0)
MCHC: 31.6 g/dL (ref 30.0–36.0)
MCV: 88.2 fL (ref 80.0–100.0)
Platelets: 236 10*3/uL (ref 150–400)
RBC: 2.87 MIL/uL — ABNORMAL LOW (ref 3.87–5.11)
RDW: 13.9 % (ref 11.5–15.5)
WBC: 11.1 10*3/uL — ABNORMAL HIGH (ref 4.0–10.5)
nRBC: 0 % (ref 0.0–0.2)

## 2023-09-06 LAB — GLUCOSE, CAPILLARY
Glucose-Capillary: 121 mg/dL — ABNORMAL HIGH (ref 70–99)
Glucose-Capillary: 125 mg/dL — ABNORMAL HIGH (ref 70–99)

## 2023-09-06 LAB — EXPECTORATED SPUTUM ASSESSMENT W GRAM STAIN, RFLX TO RESP C

## 2023-09-06 LAB — MAGNESIUM: Magnesium: 1.9 mg/dL (ref 1.7–2.4)

## 2023-09-06 LAB — PHOSPHORUS: Phosphorus: 3.1 mg/dL (ref 2.5–4.6)

## 2023-09-06 MED ORDER — INSULIN ASPART 100 UNIT/ML IJ SOLN
0.0000 [IU] | Freq: Three times a day (TID) | INTRAMUSCULAR | Status: DC
  Administered 2023-09-06 – 2023-09-08 (×2): 1 [IU] via SUBCUTANEOUS

## 2023-09-06 MED ORDER — TRAVASOL 10 % IV SOLN
INTRAVENOUS | Status: AC
  Filled 2023-09-06: qty 900

## 2023-09-06 MED ORDER — LINEZOLID 600 MG PO TABS
600.0000 mg | ORAL_TABLET | Freq: Two times a day (BID) | ORAL | Status: DC
  Filled 2023-09-06 (×5): qty 1

## 2023-09-06 MED ORDER — ALTEPLASE 2 MG IJ SOLR
2.0000 mg | Freq: Once | INTRAMUSCULAR | Status: AC
  Administered 2023-09-06: 2 mg
  Filled 2023-09-06: qty 2

## 2023-09-06 NOTE — Progress Notes (Signed)
 PHARMACY - TOTAL PARENTERAL NUTRITION CONSULT NOTE   Indication: Short bowel syndrome  Patient Measurements: Height: 5\' 4"  (162.6 cm) Weight: 57.4 kg (126 lb 8.7 oz) IBW/kg (Calculated) : 54.7 TPN AdjBW (KG): 56.1 Body mass index is 21.72 kg/m. Usual Weight: 96 lbs on 05/17/23  Assessment:  [name] with metastatic breast cancer, new liver mets presenting to the ED with septic shock. Patient has a history of short gut syndrome, on TPN at home. Patient last admitted February 2025 and 1/31 TPN formula from [facility] faxed to [facility].   Glucose / Insulin : No history of DM - CBGs 103-126 in last 24 hrs - 0 units of insulin  used in last 24hrs Electrolytes: All WNL, including CorrCa (9.6) Renal: Scr/BUN stable Hepatic: LFTs WNL. Albumin  low, T.bili/Alk Phos elevated Intake / Output; MIVF:  -UOP: unmeasured/not charted on 5/28 -No mIVF GI Imaging:  5/25 CT chest/a/p: scattered ground-glass opacities, multiple new hepatic mets, cholelithiasis, uterine fibroids  GI Surgeries / Procedures: N/A  Central access: PTA CVC double lumen in place TPN start date: resume 5/26  Nutritional Goals: - Per TPN formula faxed over 5/27, patient receives 90g protein per day, 1389 Kcal/day, 1550 mL total volume (including additives)--TPN formula can be located in the Media tab under "[facility] TPN Formula". - Per formula, patient receives lipids on M, W, F and infuses 2-in-1 all other days of the week. She takes her daily MV orally.  RD Assessment: Estimated Needs Total Energy Estimated Needs: 1650-1900 kcals Total Protein Estimated Needs: 80-100 grams Total Fluid Estimated Needs: >/= 1.7L  Current Nutrition:  -Regular diet and TPN (50% meal intake charted yesterday) -[employer] feeding supplement BID ordered inpatient -Per patient discussion with RD, she eats well at home (this is likely why her home TPN does not meet the calculated goals above)  Plan:  Continue cyclic TPN at 1800 1500 mL  given over 16-hr cycle (1hr ramp-up/ramp-down) -  will provide 90g of protein and ~1369 Kcal/day WITH lipid (only receives lipids on M, W, F). Electrolytes in TPN: No changes Na 90 mEq/L K 50 mEq/L Ca 4 mEq/L Mg 5 mEq/L  Phos 15 mmol/L Cl:Ac 1:1 Add trace elements to TPN Daily PO MVI Reduce frequency of CBG and sSSI from q6h to q8h Monitor TPN labs on Mon/Thurs, and as needed.   Shireen Dory, PharmD 09/06/23 10:53 AM

## 2023-09-06 NOTE — Plan of Care (Signed)

## 2023-09-06 NOTE — Progress Notes (Signed)
 PROGRESS NOTE    [name]  ZOX:[id number] DOB: 06-Jun-1975 DOA: 09/02/2023 PCP: Elester Grim, MD    Brief Narrative:  48 year old woman with metastatic breast cancer presented to the ED by EMS with left chest pain, tachycardia and fever, noted to be hypotensive.  At baseline she takes midodrine  due to chronic hypotension Chest x-ray showed left lower lobe consolidation Labs significant for lactate of 2.5, leukocytosis 18K, mild hyponatremia, hypokalemia, BUN/creatinine 40/2.8, magnesium  1.2 Respiratory panel negative for flu, RSV, coronavirus CT chest/abdomen/pelvis confirmed consolidation in the left lower lobe with groundglass opacities bilateral and hypodense lesions in the liver   Started on Levophed  after fluid bolus in the ED  Assessment and Plan: Circulatlry shock (sepsis and RAI)-due to left lower lobe community-acquired pneumonia, not on cancer treatment currently but consider immunocompromised, urine strep antigen neg 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  -STaph Epi growing again in 1 set (both bottles) of 2.  - Urine strep/leg negtive - Fever on p.m. of 5/28, patient had refused vancomycin  so medication changed to Zyvox  and will need to monitor for fevers, reassuringly her white blood cell count is trending down - Sputum culture if able    AKI  - Resolved   Hypokalemia  - Repleted   Breast cancer with new findings of liver metastasis  -PET scan and liver biopsy is planned by her primary oncologist Dr. Gudina.   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 - Continue TNA per pharmacy     DVT prophylaxis:      Code Status: Full Code   Disposition Plan:  Level of care: Med-Surg Status is: Inpatient  For the next 1 to 2 days    Consultants:  PCCM   Subjective: Still with cough and soreness in the chest wall at times  Objective: Vitals:   09/05/23 2357 09/06/23 0350 09/06/23 0500 09/06/23 0819  BP: 99/62 118/81  105/68  Pulse:  (!) 109  (!) 112  Resp: 18 18  19   Temp: 99.6 F (37.6 C) 100.3 F (37.9 C)    TempSrc: Oral Oral    SpO2: 97% 100%  97%  Weight:   57.4 kg   Height:        Intake/Output Summary (Last 24 hours) at 09/06/2023 1319 Last data filed at 09/06/2023 0602 Gross per 24 hour  Intake 1467.28 ml  Output --  Net 1467.28 ml   Filed Weights   09/03/23 0500 09/04/23 0500 09/06/23 0500  Weight: 56.1 kg 57.7 kg 57.4 kg    Examination:   General: Appearance:    Well developed, well nourished female in no acute distress     Lungs:     respirations unlabored  Heart:    Tachycardic. Normal rhythm. No murmurs, rubs, or gallops.    MS:   All extremities are intact.    Neurologic:   Awake, alert, oriented x 3. No apparent focal neurological           defect.        Data Reviewed: I have personally reviewed following labs and imaging studies  CBC: Recent Labs  Lab 09/02/23 1443 09/02/23  1451 09/03/23 0541 09/04/23 0337 09/06/23 0108  WBC 18.6*  --  22.9* 16.9* 11.1*  NEUTROABS 16.5*  --   --  14.7*  --   HGB 10.7* 11.2* 9.4* 8.3* 8.0*  HCT 32.9* 33.0* 28.8* 26.1* 25.3*  MCV 81.4  --  81.8 83.4 88.2  PLT 195  --  177 179 236   Basic Metabolic Panel: Recent Labs  Lab 09/02/23 1443 09/02/23 1451 09/03/23 0541 09/04/23 0337 09/05/23 0656 09/06/23 0909  NA 132* 134* 135 131* 139 136  K 3.4* 3.6 3.5 3.2* 4.0 4.2  CL 98 98 101 99 104 105  CO2 23  --  24 24 26  21*  GLUCOSE 90 90 99 148* 124* 102*  BUN 44* 40* 26* 20 30* 30*  CREATININE 2.47* 2.80* 1.26* 1.09* 0.89 1.08*  CALCIUM  8.2*  --  8.2* 8.3* 8.9 8.6*  MG 1.2*  --  2.6* 1.7 2.3 1.9  PHOS   --   --  2.1* 1.8* 2.6 3.1   GFR: Estimated Creatinine Clearance: 56.8 mL/min (A) (by C-G formula based on SCr of 1.08 mg/dL (H)). Liver Function Tests: Recent Labs  Lab 09/02/23 1443 09/04/23 0337 09/05/23 0656 09/06/23 0909  AST 78* 36 30 30  ALT 55* 35 33 32  ALKPHOS 187* 148* 158* 203*  BILITOT 4.0* 3.8* 2.7* 1.8*  PROT 8.0 6.9 7.3 7.2  ALBUMIN  3.2* 2.5* 2.6* 2.7*   No results for input(s): "LIPASE", "AMYLASE" in the last 168 hours. No results for input(s): "AMMONIA" in the last 168 hours. Coagulation Profile: No results for input(s): "INR", "PROTIME" in the last 168 hours. Cardiac Enzymes: No results for input(s): "CKTOTAL", "CKMB", "CKMBINDEX", "TROPONINI" in the last 168 hours. BNP (last 3 results) No results for input(s): "PROBNP" in the last 8760 hours. HbA1C: No results for input(s): "HGBA1C" in the last 72 hours. CBG: Recent Labs  Lab 09/05/23 0702 09/05/23 1204 09/05/23 1755 09/05/23 2351 09/06/23 0617  GLUCAP 113* 105* 95 126* 121*   Lipid Profile: No results for input(s): "CHOL", "HDL", "LDLCALC", "TRIG", "CHOLHDL", "LDLDIRECT" in the last 72 hours. Thyroid  Function Tests: No results for input(s): "TSH", "T4TOTAL", "FREET4", "T3FREE", "THYROIDAB" in the last 72 hours. Anemia Panel: No results for input(s): "VITAMINB12", "FOLATE", "FERRITIN", "TIBC", "IRON", "RETICCTPCT" in the last 72 hours. Sepsis Labs: Recent Labs  Lab 09/02/23 1450 09/02/23 2003 09/03/23 1954  LATICACIDVEN 2.5* 2.2* 0.9    Recent Results (from the past 240 hours)  Blood Culture (routine x 2)     Status: Abnormal   Collection Time: 09/02/23  2:43 PM   Specimen: Left Antecubital; Blood  Result Value Ref Range Status   Specimen Description   Final    LEFT ANTECUBITAL BOTTLES DRAWN AEROBIC AND ANAEROBIC Performed at [facility], [address], [place], [place] [address]    Special Requests   Final    Blood Culture adequate volume Performed at [facility], [address], [place], [place] [address]    Culture  Setup Time   Final    GRAM POSITIVE COCCI IN CLUSTERS IN BOTH AEROBIC AND ANAEROBIC BOTTLES CRITICAL RESULT CALLED TO, READ BACK BY AND VERIFIED WITH: PHARMD J LEGGE [id number] AT 954 AM BY CM    Culture (A)  Final    STAPHYLOCOCCUS EPIDERMIDIS THE SIGNIFICANCE OF ISOLATING THIS ORGANISM FROM A SINGLE SET OF BLOOD CULTURES WHEN MULTIPLE SETS ARE DRAWN IS UNCERTAIN. PLEASE NOTIFY THE MICROBIOLOGY DEPARTMENT WITHIN ONE WEEK IF SPECIATION AND SENSITIVITIES ARE  REQUIRED. Performed at [facility], [address] [place], [place] [address]    Report Status 09/05/2023 FINAL  Final  Blood Culture ID Panel (Reflexed)     Status: Abnormal   Collection Time: 09/02/23  2:43 PM  Result Value Ref Range Status   Enterococcus faecalis NOT DETECTED NOT DETECTED Final   Enterococcus Faecium NOT DETECTED NOT DETECTED Final   Listeria monocytogenes NOT DETECTED NOT DETECTED Final   Staphylococcus species DETECTED (A) NOT DETECTED Final    Comment: CRITICAL RESULT CALLED TO, READ BACK BY AND VERIFIED WITH: PHARMD J LEGGE [id number] AT 957 AM BY CM    Staphylococcus aureus (BCID) NOT DETECTED NOT DETECTED Final   Staphylococcus epidermidis DETECTED (A) NOT DETECTED Final    Comment: CRITICAL RESULT CALLED TO, READ BACK BY AND VERIFIED WITH: PHARMD J LEGGE [id number] AT 957 AM BY CM    Staphylococcus lugdunensis NOT DETECTED NOT DETECTED Final   Streptococcus species NOT DETECTED NOT DETECTED Final   Streptococcus agalactiae NOT DETECTED NOT DETECTED Final   Streptococcus pneumoniae NOT DETECTED NOT DETECTED Final   Streptococcus pyogenes NOT DETECTED NOT DETECTED Final   A.calcoaceticus-baumannii NOT DETECTED NOT DETECTED Final   Bacteroides fragilis NOT DETECTED NOT DETECTED Final   Enterobacterales NOT DETECTED NOT DETECTED Final   Enterobacter cloacae complex NOT DETECTED NOT DETECTED Final   Escherichia coli NOT DETECTED  NOT DETECTED Final   Klebsiella aerogenes NOT DETECTED NOT DETECTED Final   Klebsiella oxytoca NOT DETECTED NOT DETECTED Final   Klebsiella pneumoniae NOT DETECTED NOT DETECTED Final   Proteus species NOT DETECTED NOT DETECTED Final   Salmonella species NOT DETECTED NOT DETECTED Final   Serratia marcescens NOT DETECTED NOT DETECTED Final   Haemophilus influenzae NOT DETECTED NOT DETECTED Final   Neisseria meningitidis NOT DETECTED NOT DETECTED Final   Pseudomonas aeruginosa NOT DETECTED NOT DETECTED Final   Stenotrophomonas maltophilia NOT DETECTED NOT DETECTED Final   Candida albicans NOT DETECTED NOT DETECTED Final   Candida auris NOT DETECTED NOT DETECTED Final   Candida glabrata NOT DETECTED NOT DETECTED Final   Candida krusei NOT DETECTED NOT DETECTED Final   Candida parapsilosis NOT DETECTED NOT DETECTED Final   Candida tropicalis NOT DETECTED NOT DETECTED Final   Cryptococcus neoformans/gattii NOT DETECTED NOT DETECTED Final   Methicillin resistance mecA/C NOT DETECTED NOT DETECTED Final    Comment: Performed at [facility], [address], [place], [place] [address]  Resp panel by RT-PCR (RSV, Flu A&B, Covid) Anterior Nasal Swab     Status: None   Collection Time: 09/02/23  2:57 PM   Specimen: Anterior Nasal Swab  Result Value Ref Range Status   SARS Coronavirus 2 by RT PCR NEGATIVE NEGATIVE Final    Comment: (NOTE) SARS-CoV-2 target nucleic acids are NOT DETECTED.  The SARS-CoV-2 RNA is generally detectable in upper respiratory specimens during the acute phase of infection. The lowest concentration of SARS-CoV-2 viral copies this assay can detect is 138 copies/mL. A negative result does not preclude SARS-Cov-2 infection and should not be used as the sole basis for treatment or other patient management decisions. A negative result may occur with  improper specimen collection/handling, submission of specimen other than nasopharyngeal swab, presence of viral  mutation(s) within the areas targeted by this assay, and inadequate number of viral copies(<138 copies/mL). A negative result must be combined with clinical observations, patient history, and epidemiological information. The expected result is Negative.  Fact Sheet for Patients:  BloggerCourse.com  Fact Sheet for Healthcare Providers:  SeriousBroker.it  This test is no t yet approved or cleared by the [place]  FDA and  has been authorized for detection and/or diagnosis of SARS-CoV-2 by FDA under an Emergency Use Authorization (EUA). This EUA will remain  in effect (meaning this test can be used) for the duration of the COVID-19 declaration under Section 564(b)(1) of the Act, 21 U.S.C.section 360bbb-3(b)(1), unless the authorization is terminated  or revoked sooner.       Influenza A by PCR NEGATIVE NEGATIVE Final   Influenza B by PCR NEGATIVE NEGATIVE Final    Comment: (NOTE) The Xpert Xpress SARS-CoV-2/FLU/RSV plus assay is intended as an aid in the diagnosis of influenza from Nasopharyngeal swab specimens and should not be used as a sole basis for treatment. Nasal washings and aspirates are unacceptable for Xpert Xpress SARS-CoV-2/FLU/RSV testing.  Fact Sheet for Patients: BloggerCourse.com  Fact Sheet for Healthcare Providers: SeriousBroker.it  This test is not yet approved or cleared by the [place]  FDA and has been authorized for detection and/or diagnosis of SARS-CoV-2 by FDA under an Emergency Use Authorization (EUA). This EUA will remain in effect (meaning this test can be used) for the duration of the COVID-19 declaration under Section 564(b)(1) of the Act, 21 U.S.C. section 360bbb-3(b)(1), unless the authorization is terminated or revoked.     Resp Syncytial Virus by PCR NEGATIVE NEGATIVE Final    Comment: (NOTE) Fact Sheet for  Patients: BloggerCourse.com  Fact Sheet for Healthcare Providers: SeriousBroker.it  This test is not yet approved or cleared by the [place]  FDA and has been authorized for detection and/or diagnosis of SARS-CoV-2 by FDA under an Emergency Use Authorization (EUA). This EUA will remain in effect (meaning this test can be used) for the duration of the COVID-19 declaration under Section 564(b)(1) of the Act, 21 U.S.C. section 360bbb-3(b)(1), unless the authorization is terminated or revoked.  Performed at [facility], [address], [place], [place] [address]   MRSA Next Gen by PCR, Nasal     Status: Abnormal   Collection Time: 09/02/23  8:49 PM   Specimen: Nasal Mucosa; Nasal Swab  Result Value Ref Range Status   MRSA by PCR Next Gen DETECTED (A) NOT DETECTED Final    Comment: RESULT CALLED TO, READ BACK BY AND VERIFIED WITH: Gisele Lamas, RN [id number] 09/02/23 BY Gearld Keep (NOTE) The GeneXpert MRSA Assay (FDA approved for NASAL specimens only), is one component of a comprehensive MRSA colonization surveillance program. It is not intended to diagnose MRSA infection nor to guide or monitor treatment for MRSA infections. Test performance is not FDA approved in patients less than 73 years old. Performed at [facility], [address], [place], [place] [address]   Blood Culture (routine x 2)     Status: None (Preliminary result)   Collection Time: 09/02/23  9:30 PM   Specimen: BLOOD LEFT ARM  Result Value Ref Range Status   Specimen Description BLOOD LEFT ARM  Final   Special Requests   Final    BOTTLES DRAWN AEROBIC ONLY Blood Culture adequate volume   Culture   Final    NO GROWTH 4 DAYS Performed at [facility], [address] [place], [place] [address]    Report Status PENDING  Incomplete         Radiology Studies: No results found.      Scheduled Meds:  feeding  supplement (KATE FARMS STANDARD 1.4)  325 mL  Oral BID BM   fludrocortisone   0.05 mg Oral Daily   guaiFENesin   1,200 mg Oral BID   insulin  aspart  0-9 Units Subcutaneous Q8H   linezolid   600 mg Oral Q12H   midodrine   10 mg Oral TID WC   multivitamin with minerals  1 tablet Oral Daily   mupirocin  ointment  1 Application Nasal BID   sodium chloride  flush  10-40 mL Intracatheter Q12H   Continuous Infusions:  ceFEPime  (MAXIPIME ) IV 2 g (09/06/23 1202)   TPN CYCLIC-ADULT (ION)       LOS: 4 days    Time spent: 45 minutes spent on chart review, discussion with nursing staff, consultants, updating family and interview/physical exam; more than 50% of that time was spent in counseling and/or coordination of care.    Enrigue Harvard, DO [facility] Available via Epic secure chat 7am-7pm After these hours, please refer to coverage provider listed on [email or web address] 09/06/2023, 1:19 PM

## 2023-09-06 NOTE — Progress Notes (Signed)
 Nutrition Follow-up  DOCUMENTATION CODES:   Non-severe (moderate) malnutrition in context of chronic illness  INTERVENTION:  - Continue cyclic TPN.             - TPN management per pharmacy.    - Regular diet.  [employer] 1.4 PO BID, each supplement provides 455 kcal and 20 grams protein.   - Monitor weight trends.   NUTRITION DIAGNOSIS:   Moderate Malnutrition related to chronic illness (Crohn's disease with short gut syndrome) as evidenced by mild fat depletion, moderate muscle depletion. *new  GOAL:   Patient will meet greater than or equal to 90% of their needs *met with TPN/oral diet  MONITOR:   PO intake, Supplement acceptance, Labs, Weight trends  REASON FOR ASSESSMENT:   Other (Comment) (Starting TPN per Pharmacist)    ASSESSMENT:   48 year old female with metastatic breast cancer (initially diagnosed 05/2020 and underwent neoadjuvant therapy, chemotherapy, and bilateral masectomy but relapsed in 03/2022 and now with liver mets awaiting liver biopsy), Crohn's disease s/p extensive colon resection with short gut syndrome who is on home TPN. Presented with left chest pain, tachycardia and fever, noted to be hypotensive. Admitted for septic shock.  Patient reports she has been eating less than normal since admission. Appetite has been decreased. Occasionally skipping breakfast but thankfully has consistently been eating lunch and dinner. Documented to be consuming 50-75% of meals. She also reports she had breakfast for the first time today and ate all of it (cereal and an orange). She feels her appetite has finally started to come back. She has been drinking Johny Nap twice daily in addition to eating meal.   She remains on cyclic TPN per her home regimen. She has been receiving 90g protein daily and receiving 1023 kcals on days she doesn't get lipids and 1369 kcals on days she does receive lipids (M, W, F).   Of note, although patient continues to have muscle and  fat wasting, this has improved since last admission/NFPE in January.   Admit weight: 121# Current weight: 126# I&O's: +6L since admit  Medications reviewed and include: MVI   Labs reviewed:  Creatinine 1.08   NUTRITION - FOCUSED PHYSICAL EXAM:  Flowsheet Row Most Recent Value  Orbital Region Mild depletion  Upper Arm Region No depletion  Thoracic and Lumbar Region Mild depletion  Buccal Region Mild depletion  Temple Region Mild depletion  Clavicle Bone Region Moderate depletion  Clavicle and Acromion Bone Region Moderate depletion  Scapular Bone Region Unable to assess  Dorsal Hand No depletion  Patellar Region Mild depletion  Anterior Thigh Region Mild depletion  Posterior Calf Region No depletion  Edema (RD Assessment) None  Hair Reviewed  Eyes Reviewed  Mouth Reviewed  Skin Reviewed  Nails Reviewed       Diet Order:   Diet Order             Diet regular Room service appropriate? Yes; Fluid consistency: Thin  Diet effective now                   EDUCATION NEEDS:  Education needs have been addressed  Skin:  Skin Assessment: Reviewed RN Assessment  Last BM:  5/28 - ileostomy  Height:  Ht Readings from Last 1 Encounters:  09/02/23 5\' 4"  (1.626 m)   Weight:  Wt Readings from Last 1 Encounters:  09/06/23 57.4 kg   BMI:  Body mass index is 21.72 kg/m.  Estimated Nutritional Needs:  Kcal:  1650-1900 kcals Protein:  80-100 grams Fluid:  >/= 1.7L    Scheryl Cushing RD, LDN Contact via Secure Chat.

## 2023-09-06 NOTE — Progress Notes (Signed)
 eLink Physician-Brief Progress Note Patient Name: [name] DOB: 12-21-1975 MRN: [id number]   Date of Service  09/06/2023  HPI/Events of Note  Bedside RN notifying us  that patient is refusing Vanc as she has an allergy listed to this. Allergy listed as low. itching. Here for CAP-sepsis.   eICU Interventions  Continue cefepime . AM team to look into further antibiotics changes like Linezolid  if suspecting MRSA/MSSA.      Intervention Category Intermediate Interventions: Other:  Rexann Catalan 09/06/2023, 12:06 AM

## 2023-09-07 ENCOUNTER — Encounter: Payer: Self-pay | Admitting: *Deleted

## 2023-09-07 DIAGNOSIS — A419 Sepsis, unspecified organism: Secondary | ICD-10-CM | POA: Diagnosis not present

## 2023-09-07 DIAGNOSIS — R6521 Severe sepsis with septic shock: Secondary | ICD-10-CM | POA: Diagnosis not present

## 2023-09-07 LAB — CBC
HCT: 24.9 % — ABNORMAL LOW (ref 36.0–46.0)
Hemoglobin: 7.9 g/dL — ABNORMAL LOW (ref 12.0–15.0)
MCH: 26.1 pg (ref 26.0–34.0)
MCHC: 31.7 g/dL (ref 30.0–36.0)
MCV: 82.2 fL (ref 80.0–100.0)
Platelets: 263 10*3/uL (ref 150–400)
RBC: 3.03 MIL/uL — ABNORMAL LOW (ref 3.87–5.11)
RDW: 13.6 % (ref 11.5–15.5)
WBC: 11.7 10*3/uL — ABNORMAL HIGH (ref 4.0–10.5)
nRBC: 0 % (ref 0.0–0.2)

## 2023-09-07 LAB — LEGIONELLA PNEUMOPHILA SEROGP 1 UR AG: L. pneumophila Serogp 1 Ur Ag: NEGATIVE

## 2023-09-07 LAB — PHOSPHORUS: Phosphorus: 3 mg/dL (ref 2.5–4.6)

## 2023-09-07 LAB — CULTURE, BLOOD (ROUTINE X 2): Special Requests: ADEQUATE

## 2023-09-07 LAB — BASIC METABOLIC PANEL WITH GFR
Anion gap: 9 (ref 5–15)
BUN: 29 mg/dL — ABNORMAL HIGH (ref 6–20)
CO2: 24 mmol/L (ref 22–32)
Calcium: 8.4 mg/dL — ABNORMAL LOW (ref 8.9–10.3)
Chloride: 101 mmol/L (ref 98–111)
Creatinine, Ser: 0.94 mg/dL (ref 0.44–1.00)
GFR, Estimated: >60 mL/min (ref >60)
Glucose, Bld: 131 mg/dL — ABNORMAL HIGH (ref 70–99)
Potassium: 4.7 mmol/L (ref 3.5–5.1)
Sodium: 134 mmol/L — ABNORMAL LOW (ref 135–145)

## 2023-09-07 LAB — GLUCOSE, CAPILLARY
Glucose-Capillary: 119 mg/dL — ABNORMAL HIGH (ref 70–99)
Glucose-Capillary: 131 mg/dL — ABNORMAL HIGH (ref 70–99)
Glucose-Capillary: 156 mg/dL — ABNORMAL HIGH (ref 70–99)
Glucose-Capillary: 99 mg/dL (ref 70–99)

## 2023-09-07 LAB — MAGNESIUM: Magnesium: 2.2 mg/dL (ref 1.7–2.4)

## 2023-09-07 MED ORDER — SODIUM CHLORIDE 0.9 % IV SOLN
2.0000 g | Freq: Three times a day (TID) | INTRAVENOUS | Status: DC
  Administered 2023-09-07 – 2023-09-08 (×3): 2 g via INTRAVENOUS
  Filled 2023-09-07 (×4): qty 12.5

## 2023-09-07 MED ORDER — TRAVASOL 10 % IV SOLN
INTRAVENOUS | Status: AC
  Filled 2023-09-07: qty 900

## 2023-09-07 NOTE — Plan of Care (Signed)
  Problem: Education: Goal: Knowledge of General Education information will improve Description: Including pain rating scale, medication(s)/side effects and non-pharmacologic comfort measures Outcome: Progressing   Problem: Health Behavior/Discharge Planning: Goal: Ability to manage health-related needs will improve Outcome: Progressing   Problem: Clinical Measurements: Goal: Ability to maintain clinical measurements within normal limits will improve Outcome: Progressing Goal: Will remain free from infection Outcome: Progressing Goal: Diagnostic test results will improve Outcome: Progressing Goal: Respiratory complications will improve Outcome: Progressing Goal: Cardiovascular complication will be avoided Outcome: Progressing   Problem: Activity: Goal: Risk for activity intolerance will decrease Outcome: Progressing   Problem: Elimination: Goal: Will not experience complications related to bowel motility Outcome: Progressing Goal: Will not experience complications related to urinary retention Outcome: Progressing   Problem: Pain Managment: Goal: General experience of comfort will improve and/or be controlled Outcome: Progressing   Problem: Safety: Goal: Ability to remain free from injury will improve Outcome: Progressing   Problem: Skin Integrity: Goal: Risk for impaired skin integrity will decrease Outcome: Progressing   Problem: Nutrition: Goal: Adequate nutrition will be maintained Outcome: Not Progressing   Problem: Coping: Goal: Level of anxiety will decrease Outcome: Not Progressing

## 2023-09-07 NOTE — Progress Notes (Signed)
 PHARMACY - TOTAL PARENTERAL NUTRITION CONSULT NOTE   Indication: Short gut syndrome, chronic TPN  Patient Measurements: Height: 5\' 4"  (162.6 cm) Weight: 58.1 kg (128 lb 1.4 oz) IBW/kg (Calculated) : 54.7 TPN AdjBW (KG): 56.1 Body mass index is 21.99 kg/m. Usual Weight: 96 lbs on 05/17/23  Assessment:  [name] with metastatic breast cancer, new liver mets presenting to the ED with septic shock. Patient has a history of short gut syndrome, on TPN at home. Patient last admitted February 2025 and 1/31 TPN formula from [facility] faxed to [facility].   Glucose / Insulin : No history of DM - CBGs 102-156 in last 24 hrs. Trending up on fludrocortisone - 1 units of insulin  used in last 24hrs Electrolytes: Na (134) slightly low. K (4.7) on upper end of normal. All other lytes WNL, including CorrCa (9.4) Renal: Scr/BUN stable Hepatic: LFTs WNL. Albumin  low, T.bili/Alk Phos elevated Intake / Output; MIVF: Strict I/O not charted. -UOP: 3x unmeasured, stool: 200 mL -No mIVF GI Imaging:  5/25 CT chest/a/p: scattered ground-glass opacities, multiple new hepatic mets, cholelithiasis, uterine fibroids  GI Surgeries / Procedures: N/A  Central access: PTA CVC double lumen in place TPN start date: resume 5/26  Nutritional Goals: - Per TPN formula faxed over 5/27, patient receives 90g protein per day, 1389 Kcal/day, 1550 mL total volume (including additives)--TPN formula can be located in the Media tab under "[facility] TPN Formula". - Per formula, patient receives lipids on M, W, F and infuses 2-in-1 all other days of the week. She takes her daily MV orally.  RD Assessment: Estimated Needs Total Energy Estimated Needs: 1650-1900 kcals Total Protein Estimated Needs: 80-100 grams Total Fluid Estimated Needs: >/= 1.7L  Current Nutrition:  -Regular diet and TPN (50% meal intake charted yesterday) -[employer] feeding supplement BID ordered inpatient -Per patient discussion with RD, she eats well at  home (this is likely why her home TPN does not meet the calculated goals above)  Plan:  Continue cyclic TPN at 1800 1500 mL given over 16-hr cycle (1hr ramp-up/ramp-down) -  will provide 90g of protein and ~1369 Kcal/day WITH lipid (only receives lipids on M, W, F). Electrolytes in TPN: Increase Na, Decrease K Na 110 mEq/L K 40 mEq/L Ca 4 mEq/L Mg 5 mEq/L  Phos 15 mmol/L Cl:Ac 1:1 Add trace elements to TPN Daily PO MVI Continue CBG and sSSI q8h Monitor TPN labs on Mon/Thurs, and as needed. Recheck electrolytes with AM labs tomorrow.   [name], PharmD 09/07/23 7:47 AM

## 2023-09-07 NOTE — Progress Notes (Signed)
 PROGRESS NOTE    [name]  OZH:[id number] DOB: November 23, 1975 DOA: 09/02/2023 PCP: Elester Grim, MD    Brief Narrative:  48 year old woman with metastatic breast cancer presented to the ED by EMS with left chest pain, tachycardia and fever, noted to be hypotensive.  At baseline she takes midodrine  due to chronic hypotension Chest x-ray showed left lower lobe consolidation Labs significant for lactate of 2.5, leukocytosis 18K, mild hyponatremia, hypokalemia, BUN/creatinine 40/2.8, magnesium  1.2 Respiratory panel negative for flu, RSV, coronavirus CT chest/abdomen/pelvis confirmed consolidation in the left lower lobe with groundglass opacities bilateral and hypodense lesions in the liver   Started on Levophed  after fluid bolus in the ED  Slow to improve, awaiting sputum culture to decide on antibiotics  Assessment and Plan: Circulatlry shock (sepsis and RAI)-due to left lower lobe community-acquired pneumonia, not on cancer treatment currently but consider immunocompromised, urine strep antigen neg 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  -STaph Epi growing again in 1 set (both bottles) of 2.  - Urine strep/leg negtive - Fever on p.m. of 5/28, patient had refused vancomycin  so medication changed to Zyvox  and will need to monitor for fevers, reassuringly her white blood cell count is trending down - Sputum culture i pending-few gram-positive and lots of gram-negative    AKI  - Resolved   Hypokalemia  - Repleted   Breast cancer with new findings of liver metastasis  -PET scan and liver biopsy is planned by her primary oncologist Dr. Gudina.    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 - Continue TNA per pharmacy   Patient anxious to be discharged to go see brother who was diagnosed with cancer recently Willing to stay until least tomorrow if culture may result  DVT prophylaxis:     Code Status: Full Code   Disposition Plan:  Level of care: Med-Surg Status is: Inpatient  Hope for discharge in the next day or so    Consultants:  PCCM   Subjective: Cough improved  Objective: Vitals:   09/06/23 2010 09/07/23 0450 09/07/23 0500 09/07/23 1235  BP: 112/86 105/69  101/70  Pulse: 97   (!) 122  Resp: 15 15    Temp: 99 F (37.2 C) 99.9 F (37.7 C)  99.1 F (37.3 C)  TempSrc: Oral Oral  Oral  SpO2: 100% 99%  100%  Weight:   58.1 kg   Height:        Intake/Output Summary (Last 24 hours) at 09/07/2023 1414 Last data filed at 09/07/2023 1353 Gross per 24 hour  Intake 2119.81 ml  Output 200 ml  Net 1919.81 ml   Filed Weights   09/04/23 0500 09/06/23 0500 09/07/23 0500  Weight: 57.7 kg 57.4 kg 58.1 kg    Examination:   General: Appearance:    Well developed, well nourished female in no acute distress     Lungs:     respirations unlabored  Heart:    Tachycardic.   MS:   All extremities are intact.    Neurologic:   Awake, alert, oriented x 3. No apparent focal neurological  defect.        Data Reviewed: I have personally reviewed following labs and imaging studies  CBC: Recent Labs  Lab 09/02/23 1443 09/02/23 1451 09/03/23 0541 09/04/23 0337 09/06/23 0108 09/07/23 0317  WBC 18.6*  --  22.9* 16.9* 11.1* 11.7*  NEUTROABS 16.5*  --   --  14.7*  --   --   HGB 10.7* 11.2* 9.4* 8.3* 8.0* 7.9*  HCT 32.9* 33.0* 28.8* 26.1* 25.3* 24.9*  MCV 81.4  --  81.8 83.4 88.2 82.2  PLT 195  --  177 179 236 263   Basic Metabolic Panel: Recent Labs  Lab 09/03/23 0541 09/04/23 0337 09/05/23 0656 09/06/23 0909 09/07/23 0317  NA 135 131* 139 136 134*  K 3.5 3.2* 4.0 4.2 4.7  CL 101 99  104 105 101  CO2 24 24 26  21* 24  GLUCOSE 99 148* 124* 102* 131*  BUN 26* 20 30* 30* 29*  CREATININE 1.26* 1.09* 0.89 1.08* 0.94  CALCIUM  8.2* 8.3* 8.9 8.6* 8.4*  MG 2.6* 1.7 2.3 1.9 2.2  PHOS 2.1* 1.8* 2.6 3.1 3.0   GFR: Estimated Creatinine Clearance: 65.3 mL/min (by C-G formula based on SCr of 0.94 mg/dL). Liver Function Tests: Recent Labs  Lab 09/02/23 1443 09/04/23 0337 09/05/23 0656 09/06/23 0909  AST 78* 36 30 30  ALT 55* 35 33 32  ALKPHOS 187* 148* 158* 203*  BILITOT 4.0* 3.8* 2.7* 1.8*  PROT 8.0 6.9 7.3 7.2  ALBUMIN  3.2* 2.5* 2.6* 2.7*   No results for input(s): "LIPASE", "AMYLASE" in the last 168 hours. No results for input(s): "AMMONIA" in the last 168 hours. Coagulation Profile: No results for input(s): "INR", "PROTIME" in the last 168 hours. Cardiac Enzymes: No results for input(s): "CKTOTAL", "CKMB", "CKMBINDEX", "TROPONINI" in the last 168 hours. BNP (last 3 results) No results for input(s): "PROBNP" in the last 8760 hours. HbA1C: No results for input(s): "HGBA1C" in the last 72 hours. CBG: Recent Labs  Lab 09/06/23 0617 09/06/23 1614 09/07/23 0004 09/07/23 0520 09/07/23 1231  GLUCAP 121* 125* 131* 156* 99   Lipid Profile: No results for input(s): "CHOL", "HDL", "LDLCALC", "TRIG", "CHOLHDL", "LDLDIRECT" in the last 72 hours. Thyroid  Function Tests: No results for input(s): "TSH", "T4TOTAL", "FREET4", "T3FREE", "THYROIDAB" in the last 72 hours. Anemia Panel: No results for input(s): "VITAMINB12", "FOLATE", "FERRITIN", "TIBC", "IRON", "RETICCTPCT" in the last 72 hours. Sepsis Labs: Recent Labs  Lab 09/02/23 1450 09/02/23 2003 09/03/23 1954  LATICACIDVEN 2.5* 2.2* 0.9    Recent Results (from the past 240 hours)  Blood Culture (routine x 2)     Status: Abnormal   Collection Time: 09/02/23  2:43 PM   Specimen: Left Antecubital; Blood  Result Value Ref Range Status   Specimen Description   Final    LEFT ANTECUBITAL BOTTLES DRAWN AEROBIC AND  ANAEROBIC Performed at [facility], [address], [place], [place] [address]    Special Requests   Final    Blood Culture adequate volume Performed at [facility], [address] [place], [place] [address]    Culture  Setup Time   Final    GRAM POSITIVE COCCI IN CLUSTERS IN BOTH AEROBIC AND ANAEROBIC BOTTLES CRITICAL RESULT CALLED TO, READ BACK BY AND VERIFIED WITH: PHARMD J LEGGE [id number] AT 954 AM BY CM    Culture (A)  Final    STAPHYLOCOCCUS EPIDERMIDIS THE SIGNIFICANCE OF ISOLATING THIS ORGANISM FROM A SINGLE SET OF BLOOD CULTURES WHEN MULTIPLE SETS ARE DRAWN IS  UNCERTAIN. PLEASE NOTIFY THE MICROBIOLOGY DEPARTMENT WITHIN ONE WEEK IF SPECIATION AND SENSITIVITIES ARE REQUIRED. Performed at [facility], [address] [place], [place] [address]    Report Status 09/05/2023 FINAL  Final  Blood Culture ID Panel (Reflexed)     Status: Abnormal   Collection Time: 09/02/23  2:43 PM  Result Value Ref Range Status   Enterococcus faecalis NOT DETECTED NOT DETECTED Final   Enterococcus Faecium NOT DETECTED NOT DETECTED Final   Listeria monocytogenes NOT DETECTED NOT DETECTED Final   Staphylococcus species DETECTED (A) NOT DETECTED Final    Comment: CRITICAL RESULT CALLED TO, READ BACK BY AND VERIFIED WITH: PHARMD J LEGGE [id number] AT 957 AM BY CM    Staphylococcus aureus (BCID) NOT DETECTED NOT DETECTED Final   Staphylococcus epidermidis DETECTED (A) NOT DETECTED Final    Comment: CRITICAL RESULT CALLED TO, READ BACK BY AND VERIFIED WITH: PHARMD J LEGGE [id number] AT 957 AM BY CM    Staphylococcus lugdunensis NOT DETECTED NOT DETECTED Final   Streptococcus species NOT DETECTED NOT DETECTED Final   Streptococcus agalactiae NOT DETECTED NOT DETECTED Final   Streptococcus pneumoniae NOT DETECTED NOT DETECTED Final   Streptococcus pyogenes NOT DETECTED NOT DETECTED Final   A.calcoaceticus-baumannii NOT DETECTED NOT DETECTED Final   Bacteroides  fragilis NOT DETECTED NOT DETECTED Final   Enterobacterales NOT DETECTED NOT DETECTED Final   Enterobacter cloacae complex NOT DETECTED NOT DETECTED Final   Escherichia coli NOT DETECTED NOT DETECTED Final   Klebsiella aerogenes NOT DETECTED NOT DETECTED Final   Klebsiella oxytoca NOT DETECTED NOT DETECTED Final   Klebsiella pneumoniae NOT DETECTED NOT DETECTED Final   Proteus species NOT DETECTED NOT DETECTED Final   Salmonella species NOT DETECTED NOT DETECTED Final   Serratia marcescens NOT DETECTED NOT DETECTED Final   Haemophilus influenzae NOT DETECTED NOT DETECTED Final   Neisseria meningitidis NOT DETECTED NOT DETECTED Final   Pseudomonas aeruginosa NOT DETECTED NOT DETECTED Final   Stenotrophomonas maltophilia NOT DETECTED NOT DETECTED Final   Candida albicans NOT DETECTED NOT DETECTED Final   Candida auris NOT DETECTED NOT DETECTED Final   Candida glabrata NOT DETECTED NOT DETECTED Final   Candida krusei NOT DETECTED NOT DETECTED Final   Candida parapsilosis NOT DETECTED NOT DETECTED Final   Candida tropicalis NOT DETECTED NOT DETECTED Final   Cryptococcus neoformans/gattii NOT DETECTED NOT DETECTED Final   Methicillin resistance mecA/C NOT DETECTED NOT DETECTED Final    Comment: Performed at [facility], [address], [place], [place] [address]  Resp panel by RT-PCR (RSV, Flu A&B, Covid) Anterior Nasal Swab     Status: None   Collection Time: 09/02/23  2:57 PM   Specimen: Anterior Nasal Swab  Result Value Ref Range Status   SARS Coronavirus 2 by RT PCR NEGATIVE NEGATIVE Final    Comment: (NOTE) SARS-CoV-2 target nucleic acids are NOT DETECTED.  The SARS-CoV-2 RNA is generally detectable in upper respiratory specimens during the acute phase of infection. The lowest concentration of SARS-CoV-2 viral copies this assay can detect is 138 copies/mL. A negative result does not preclude SARS-Cov-2 infection and should not be used as the sole basis for treatment  or other patient management decisions. A negative result may occur with  improper specimen collection/handling, submission of specimen other than nasopharyngeal swab, presence of viral mutation(s) within the areas targeted by this assay, and inadequate number of viral copies(<138 copies/mL). A negative result must be combined with clinical observations, patient history, and  epidemiological information. The expected result is Negative.  Fact Sheet for Patients:  BloggerCourse.com  Fact Sheet for Healthcare Providers:  SeriousBroker.it  This test is no t yet approved or cleared by the [place]  FDA and  has been authorized for detection and/or diagnosis of SARS-CoV-2 by FDA under an Emergency Use Authorization (EUA). This EUA will remain  in effect (meaning this test can be used) for the duration of the COVID-19 declaration under Section 564(b)(1) of the Act, 21 U.S.C.section 360bbb-3(b)(1), unless the authorization is terminated  or revoked sooner.       Influenza A by PCR NEGATIVE NEGATIVE Final   Influenza B by PCR NEGATIVE NEGATIVE Final    Comment: (NOTE) The Xpert Xpress SARS-CoV-2/FLU/RSV plus assay is intended as an aid in the diagnosis of influenza from Nasopharyngeal swab specimens and should not be used as a sole basis for treatment. Nasal washings and aspirates are unacceptable for Xpert Xpress SARS-CoV-2/FLU/RSV testing.  Fact Sheet for Patients: BloggerCourse.com  Fact Sheet for Healthcare Providers: SeriousBroker.it  This test is not yet approved or cleared by the [place]  FDA and has been authorized for detection and/or diagnosis of SARS-CoV-2 by FDA under an Emergency Use Authorization (EUA). This EUA will remain in effect (meaning this test can be used) for the duration of the COVID-19 declaration under Section 564(b)(1) of the Act, 21 U.S.C. section  360bbb-3(b)(1), unless the authorization is terminated or revoked.     Resp Syncytial Virus by PCR NEGATIVE NEGATIVE Final    Comment: (NOTE) Fact Sheet for Patients: BloggerCourse.com  Fact Sheet for Healthcare Providers: SeriousBroker.it  This test is not yet approved or cleared by the [place]  FDA and has been authorized for detection and/or diagnosis of SARS-CoV-2 by FDA under an Emergency Use Authorization (EUA). This EUA will remain in effect (meaning this test can be used) for the duration of the COVID-19 declaration under Section 564(b)(1) of the Act, 21 U.S.C. section 360bbb-3(b)(1), unless the authorization is terminated or revoked.  Performed at [facility], [address], [place], [place] [address]   MRSA Next Gen by PCR, Nasal     Status: Abnormal   Collection Time: 09/02/23  8:49 PM   Specimen: Nasal Mucosa; Nasal Swab  Result Value Ref Range Status   MRSA by PCR Next Gen DETECTED (A) NOT DETECTED Final    Comment: RESULT CALLED TO, READ BACK BY AND VERIFIED WITH: Gisele Lamas, RN 2312 09/02/23 BY Gearld Keep (NOTE) The GeneXpert MRSA Assay (FDA approved for NASAL specimens only), is one component of a comprehensive MRSA colonization surveillance program. It is not intended to diagnose MRSA infection nor to guide or monitor treatment for MRSA infections. Test performance is not FDA approved in patients less than 11 years old. Performed at [facility], [address] [place], [place] [address]   Blood Culture (routine x 2)     Status: None   Collection Time: 09/02/23  9:30 PM   Specimen: BLOOD LEFT ARM  Result Value Ref Range Status   Specimen Description BLOOD LEFT ARM  Final   Special Requests   Final    BOTTLES DRAWN AEROBIC ONLY Blood Culture adequate volume   Culture   Final    NO GROWTH 5 DAYS Performed at [facility], [address], [place],  [place] [address]    Report Status 09/07/2023 FINAL  Final  Expectorated Sputum Assessment w Gram Stain, Rflx to Resp Cult     Status:  None   Collection Time: 09/06/23 12:02 PM   Specimen: Expectorated Sputum  Result Value Ref Range Status   Specimen Description EXPECTORATED SPUTUM  Final   Special Requests NONE  Final   Sputum evaluation   Final    THIS SPECIMEN IS ACCEPTABLE FOR SPUTUM CULTURE Performed at [facility], [address], [place], [place] [address]    Report Status 09/06/2023 FINAL  Final  Culture, Respiratory w Gram Stain     Status: None (Preliminary result)   Collection Time: 09/06/23 12:02 PM  Result Value Ref Range Status   Specimen Description   Final    EXPECTORATED SPUTUM Performed at [facility], [address], [place], [place] [address]    Special Requests   Final    NONE Reflexed from [id number] Performed at [facility], [address], [place], [place] [address]    Gram Stain   Final    ABUNDANT WBC PRESENT, PREDOMINANTLY PMN FEW GRAM POSITIVE COCCI ABUNDANT GRAM NEGATIVE RODS    Culture   Final    CULTURE REINCUBATED FOR BETTER GROWTH Performed at [facility], [address], [place], [place] [address]    Report Status PENDING  Incomplete         Radiology Studies: No results found.      Scheduled Meds:  feeding supplement ([employer] STANDARD 1.4)  325 mL Oral BID BM   fludrocortisone  0.05 mg Oral Daily   insulin  aspart  0-9 Units Subcutaneous Q8H   linezolid   600 mg Oral Q12H   midodrine   10 mg Oral TID WC   multivitamin with minerals  1 tablet Oral Daily   mupirocin  ointment  1 Application Nasal BID   sodium chloride  flush  10-40 mL Intracatheter Q12H   Continuous Infusions:  ceFEPime  (MAXIPIME ) IV 2 g (09/07/23 0900)   TPN CYCLIC-ADULT (ION)       LOS: 5 days    Time spent: 45 minutes spent on chart review, discussion with nursing staff, consultants, updating  family and interview/physical exam; more than 50% of that time was spent in counseling and/or coordination of care.    Enrigue Harvard, DO [facility] Available via Epic secure chat 7am-7pm After these hours, please refer to coverage provider listed on [email or web address] 09/07/2023, 2:14 PM

## 2023-09-07 NOTE — TOC Progression Note (Signed)
 Transition of Care [facility]) - Progression Note    Patient Details  Name: [name] MRN: [id number] Date of Birth: 12/11/1975  [facility] CM/SW Contact  Marty Sleet, LCSW Phone Number: 09/07/2023, 2:08 PM  Clinical Narrative:    Pt active with [facility] for home TPN. Pt recently established with [facility] who would like pt to begin receiving TPN though [facility]. Referral sent to [facility] at [phone]. Spoke with Oralee Billow with [facility] who shared that [facility] will be unable to begin providing TPN for about 2 weeks as they have to request authorization. [facility] will continue to provide TPN for pt until she is able to switch to [facility]. Pt has TPN at home to last through Saturday but, will not have TPN for Sunday infusion. [facility] will be able to get new TPN to patient on Monday. MD aware.  TOC continuing to follow.   Expected Discharge Plan: Home/Self Care    Expected Discharge Plan and Services                                               Social Determinants of Health (SDOH) Interventions SDOH Screenings   Food Insecurity: No Food Insecurity (05/13/2023)  Housing: Unknown (05/28/2023)   Received from [facility]  Transportation Needs: No Transportation Needs (05/13/2023)  Utilities: Not At Risk (05/13/2023)  Tobacco Use: Medium Risk (09/02/2023)    Readmission Risk Interventions    09/04/2023    9:56 AM 05/13/2023   12:48 PM 12/20/2022    3:50 PM  Readmission Risk Prevention Plan  Transportation Screening Complete Complete Complete  PCP or Specialist Appt within 3-5 Days Complete    HRI or Home Care Consult Complete    Social Work Consult for Recovery Care Planning/Counseling Complete    Palliative Care Screening Complete    Medication Review Oceanographer) Complete Complete Complete  PCP or Specialist appointment within 3-5 days of discharge   Complete  HRI or Home Care Consult  Complete Complete  SW  Recovery Care/Counseling Consult  Complete Complete  Palliative Care Screening  Not Applicable Complete  Skilled Nursing Facility  Not Applicable Not Applicable

## 2023-09-07 NOTE — Plan of Care (Signed)

## 2023-09-08 DIAGNOSIS — A419 Sepsis, unspecified organism: Secondary | ICD-10-CM | POA: Diagnosis not present

## 2023-09-08 DIAGNOSIS — R6521 Severe sepsis with septic shock: Secondary | ICD-10-CM | POA: Diagnosis not present

## 2023-09-08 LAB — BASIC METABOLIC PANEL WITH GFR
Anion gap: 10 (ref 5–15)
BUN: 34 mg/dL — ABNORMAL HIGH (ref 6–20)
CO2: 23 mmol/L (ref 22–32)
Calcium: 8.6 mg/dL — ABNORMAL LOW (ref 8.9–10.3)
Chloride: 99 mmol/L (ref 98–111)
Creatinine, Ser: 0.99 mg/dL (ref 0.44–1.00)
GFR, Estimated: >60 mL/min (ref >60)
Glucose, Bld: 127 mg/dL — ABNORMAL HIGH (ref 70–99)
Potassium: 4.2 mmol/L (ref 3.5–5.1)
Sodium: 132 mmol/L — ABNORMAL LOW (ref 135–145)

## 2023-09-08 LAB — CBC
HCT: 25.8 % — ABNORMAL LOW (ref 36.0–46.0)
Hemoglobin: 8 g/dL — ABNORMAL LOW (ref 12.0–15.0)
MCH: 26 pg (ref 26.0–34.0)
MCHC: 31 g/dL (ref 30.0–36.0)
MCV: 83.8 fL (ref 80.0–100.0)
Platelets: 276 10*3/uL (ref 150–400)
RBC: 3.08 MIL/uL — ABNORMAL LOW (ref 3.87–5.11)
RDW: 13.6 % (ref 11.5–15.5)
WBC: 10.3 10*3/uL (ref 4.0–10.5)
nRBC: 0 % (ref 0.0–0.2)

## 2023-09-08 LAB — PHOSPHORUS: Phosphorus: 3.5 mg/dL (ref 2.5–4.6)

## 2023-09-08 LAB — GLUCOSE, CAPILLARY: Glucose-Capillary: 129 mg/dL — ABNORMAL HIGH (ref 70–99)

## 2023-09-08 LAB — MAGNESIUM: Magnesium: 2.3 mg/dL (ref 1.7–2.4)

## 2023-09-08 MED ORDER — LEVOFLOXACIN 750 MG PO TABS
750.0000 mg | ORAL_TABLET | Freq: Every day | ORAL | Status: DC
  Administered 2023-09-08: 750 mg via ORAL
  Filled 2023-09-08: qty 1

## 2023-09-08 MED ORDER — FLUDROCORTISONE ACETATE 0.1 MG PO TABS: 0.0500 mg | ORAL_TABLET | Freq: Every day | ORAL | 1 refills | Status: DC

## 2023-09-08 MED ORDER — LEVOFLOXACIN 750 MG PO TABS: 750.0000 mg | ORAL_TABLET | Freq: Every day | ORAL | 0 refills | Status: DC

## 2023-09-08 MED ORDER — ACETAMINOPHEN 325 MG PO TABS: 650.0000 mg | ORAL_TABLET | Freq: Four times a day (QID) | ORAL | Status: DC | PRN

## 2023-09-08 NOTE — TOC Transition Note (Addendum)
 Transition of Care [facility]) - Discharge Note   Patient Details  Name: [name] MRN: [id number] Date of Birth: 1975-12-03  Transition of Care [facility]) CM/SW Contact:  Levie Ream, RN Phone Number: 09/08/2023, 1:02 PM   Clinical Narrative:    D/C orders received;verified w/ Kay Parson that pt has TPN and HHRN w/ [facility]; [facility] will assume care when ins auth received; agency contact info placed in follow up provider section of d/c instructions; no TOC needs.   Final next level of care: Home/Self Care Barriers to Discharge: No Barriers Identified   Patient Goals and CMS Choice            Discharge Placement                       Discharge Plan and Services Additional resources added to the After Visit Summary for                            HH Arranged: RN, TPN HH Agency: [facility] Date HH Agency Contacted: 09/08/23 Time HH Agency Contacted: 1301 Representative spoke with at [facility] Agency: Kay Parson  Social Drivers of Health (SDOH) Interventions SDOH Screenings   Food Insecurity: No Food Insecurity (05/13/2023)  Housing: Unknown (05/28/2023)   Received from [facility]  Transportation Needs: No Transportation Needs (05/13/2023)  Utilities: Not At Risk (05/13/2023)  Tobacco Use: Medium Risk (09/02/2023)     Readmission Risk Interventions    09/04/2023    9:56 AM 05/13/2023   12:48 PM 12/20/2022    3:50 PM  Readmission Risk Prevention Plan  Transportation Screening Complete Complete Complete  PCP or Specialist Appt within 3-5 Days Complete    HRI or Home Care Consult Complete    Social Work Consult for Recovery Care Planning/Counseling Complete    Palliative Care Screening Complete    Medication Review Oceanographer) Complete Complete Complete  PCP or Specialist appointment within 3-5 days of discharge   Complete  HRI or Home Care Consult  Complete Complete  SW Recovery Care/Counseling Consult  Complete Complete   Palliative Care Screening  Not Applicable Complete  Skilled Nursing Facility  Not Applicable Not Applicable

## 2023-09-08 NOTE — Progress Notes (Signed)
 PHARMACY - TOTAL PARENTERAL NUTRITION CONSULT NOTE   Indication: Short gut syndrome, chronic TPN  Patient Measurements: Height: 5\' 4"  (162.6 cm) Weight: 56.6 kg (124 lb 12.5 oz) IBW/kg (Calculated) : 54.7 TPN AdjBW (KG): 56.1 Body mass index is 21.42 kg/m. Usual Weight: 96 lbs on 05/17/23  Assessment:  [name] with metastatic breast cancer, new liver mets presenting to the ED with septic shock. Patient has a history of short gut syndrome, on TPN at home. Patient last admitted February 2025 and 1/31 TPN formula from [facility] faxed to [facility].   Glucose / Insulin : No history of DM - CBGs 99-129 in last 24 hrs. - 1 unit of insulin  used in last 24hrs Electrolytes: Na (132) slightly low, all other lytes WNL including CoCa 9.4 Renal: Scr stable, BUN slightly high at 34 Hepatic: 5/29 labs: LFTs WNL. Albumin  low, T.bili/Alk Phos elevated Intake / Output; MIVF: Strict I/O not charted. -UOP: 2x unmeasured, stool: 200 mL -PO intake: 240 mL -No mIVF GI Imaging:  5/25 CT chest/a/p: scattered ground-glass opacities, multiple new hepatic mets, cholelithiasis, uterine fibroids  GI Surgeries / Procedures: N/A  Central access: PTA CVC double lumen in place TPN start date: resume 5/26  Nutritional Goals: - Per TPN formula faxed over 5/27, patient receives 90g protein per day, 1389 Kcal/day, 1550 mL total volume (including additives)--TPN formula can be located in the Media tab under "[facility] TPN Formula". - Per formula, patient receives lipids on M, W, F and infuses 2-in-1 all other days of the week. She takes her daily MV orally.  RD Assessment: Estimated Needs Total Energy Estimated Needs: 1650-1900 kcals Total Protein Estimated Needs: 80-100 grams Total Fluid Estimated Needs: >/= 1.7L  Current Nutrition:  -Regular diet and TPN (100% meal intake charted yesterday) -[employer] feeding supplement BID ordered inpatient -Per patient discussion with RD, she eats well at home (this is  likely why her home TPN does not meet the calculated goals above)  Plan:  Per MD, patient is discharging today--will not order inpatient TPN today. Spoke with [facility] 5/31 AM--patient to use TPN that is currently at her home, patient aware.    Roselee Cong, PharmD Clinical Pharmacist  5/31/20258:49 AM

## 2023-09-08 NOTE — Plan of Care (Signed)
 No acute events this shift. Problem: Education: Goal: Knowledge of General Education information will improve Description: Including pain rating scale, medication(s)/side effects and non-pharmacologic comfort measures Outcome: Progressing   Problem: Health Behavior/Discharge Planning: Goal: Ability to manage health-related needs will improve Outcome: Progressing   Problem: Clinical Measurements: Goal: Ability to maintain clinical measurements within normal limits will improve Outcome: Progressing Goal: Will remain free from infection Outcome: Progressing Goal: Diagnostic test results will improve Outcome: Progressing Goal: Respiratory complications will improve Outcome: Progressing Goal: Cardiovascular complication will be avoided Outcome: Progressing   Problem: Activity: Goal: Risk for activity intolerance will decrease Outcome: Progressing   Problem: Nutrition: Goal: Adequate nutrition will be maintained Outcome: Progressing   Problem: Coping: Goal: Level of anxiety will decrease Outcome: Progressing   Problem: Elimination: Goal: Will not experience complications related to bowel motility Outcome: Progressing Goal: Will not experience complications related to urinary retention Outcome: Progressing   Problem: Pain Managment: Goal: General experience of comfort will improve and/or be controlled Outcome: Progressing   Problem: Safety: Goal: Ability to remain free from injury will improve Outcome: Progressing   Problem: Skin Integrity: Goal: Risk for impaired skin integrity will decrease Outcome: Progressing

## 2023-09-08 NOTE — Discharge Summary (Signed)
 Physician Discharge Summary  IRAN KIEVIT ZOX:[id number] DOB: 18-Aug-1975 DOA: 09/02/2023  PCP: Elester Grim, MD  Admit date: 09/02/2023 Discharge date: 09/08/2023  Admitted From:  Discharge disposition: Home   Recommendations for Outpatient Follow-Up:   Continue TPN - Finish course of antibiotics with Levaquin Monitor blood pressure: Patient was started on Florinef  in the ICU by PCCM for orthostatic hypotension, work to wean off if able Follow sputum culture to final   Discharge Diagnosis:   Principal Problem:   Septic shock (HCC)    Discharge Condition: Improved.  Diet recommendation:   Regular/TPN  Wound care: None.  Code status: Full.   History of Present Illness:   48 year old woman with metastatic breast cancer presented to the ED by EMS with left chest pain, tachycardia and fever, noted to be hypotensive.  At baseline she takes midodrine  due to chronic hypotension Chest x-ray showed left lower lobe consolidation Labs significant for lactate of 2.5, leukocytosis 18K, mild hyponatremia, hypokalemia, BUN/creatinine 40/2.8, magnesium  1.2 Respiratory panel negative for flu, RSV, coronavirus CT chest/abdomen/pelvis confirmed consolidation in the left lower lobe with groundglass opacities bilateral and hypodense lesions in the liver   Hospital Course by Problem:   Circulatlry shock (sepsis and RAI)-due to left lower lobe community-acquired pneumonia, not on cancer treatment currently but consider immunocompromised, urine strep antigen neg 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  -STaph Epi 1/2 bottles-- suspected contaminant - Urine  strep/leg negtive -patient had refused vancomycin  so medication changed to Zyvox  which was also refused-- reassuringly her white blood cell count is trending down - Sputum culture pending-few gram-positive and lots of gram-negative-- ? Normal flora-- will change to levquin to finish course as WBC count normal     AKI  - Resolved   Hypokalemia  - Repleted   Breast cancer with new findings of liver metastasis  -PET scan and liver biopsy is planned by her primary oncologist Dr. Gudina.   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 - Continue TNA per pharmacy   Patient anxious to be discharged to go see brother who was diagnosed with cancer recently    Medical Consultants:      Discharge Exam:   Vitals:   09/07/23 2047 09/08/23 0436  BP: 112/75 109/76  Pulse: 89 (!) 106  Resp: 18 16  Temp: 99.3 F (37.4 C) 99.5 F (37.5 C)  SpO2: 100% 100%   Vitals:   09/07/23 1423 09/07/23 2047 09/08/23 0436 09/08/23 0500  BP: 110/74 112/75 109/76   Pulse: (!) 101 89 (!) 106   Resp: 18 18 16    Temp: 98 F (36.7 C) 99.3 F (37.4 C) 99.5 F (37.5 C)   TempSrc:  Oral Oral   SpO2: 100% 100% 100%   Weight:    56.6 kg  Height:        General exam: Appears calm and comfortable.   The results of significant diagnostics from this hospitalization (including imaging, microbiology, ancillary and laboratory) are listed below for reference.     Procedures and Diagnostic Studies:   CT CHEST ABDOMEN PELVIS WO CONTRAST Result Date: 09/02/2023 CLINICAL DATA:  Abdominal pain, acute  nonlocalized. Lower left-sided back pain worsening with movement or palpation. Tachycardia, lethargy. EXAM: CT CHEST, ABDOMEN AND PELVIS WITHOUT CONTRAST TECHNIQUE: Multidetector CT imaging of the chest, abdomen and pelvis was performed following the standard protocol without IV contrast. RADIATION DOSE REDUCTION: This exam was performed according to the departmental dose-optimization program which includes  automated exposure control, adjustment of the mA and/or kV according to patient size and/or use of iterative reconstruction technique. COMPARISON:  08/28/2023. FINDINGS: CT CHEST FINDINGS Cardiovascular: The heart is normal in size and there is a trace pericardial effusion. The distal tip of a right central venous catheter terminates in the right atrium. The aorta and pulmonary trunk are normal in caliber. No mediastinal or axillary lymphadenopathy is seen. Mediastinum/Nodes: No mediastinal or axillary lymphadenopathy seen. Evaluation of hila is limited due to lack of IV contrast. The thyroid  gland, trachea, and esophagus are within normal limits. Lungs/Pleura: Consolidation is noted in left lower lobe. Scattered ground-glass opacities are noted in the lungs bilaterally. Fibrotic changes are noted in the anterior aspect of the right lung, which may be related to prior radiation therapy. No effusion or pneumothorax is seen. Musculoskeletal: There is evidence of prior right mastectomy. A breast implant is noted on the left. No acute or suspicious osseous abnormality is seen. CT ABDOMEN PELVIS FINDINGS Hepatobiliary: Hypodense masslike regions are present in the liver measuring up to 5.2 cm, not significantly changed from the prior exam. Stones are present within the gallbladder. There is no biliary ductal dilatation. Pancreas: Unremarkable. No pancreatic ductal dilatation or surrounding inflammatory changes. Spleen: Normal in size without focal abnormality. Adrenals/Urinary Tract: The adrenal glands are within normal limits. No renal calculus are obstructive uropathy is seen bilaterally. The bladder is unremarkable. Stomach/Bowel: The stomach is within normal limits. There is evidence of prior subtotal colectomy with left lower quadrant ileostomy. No bowel obstruction, free air, or pneumatosis is seen. Vascular/Lymphatic: No significant vascular findings are present. No enlarged abdominal or pelvic lymph nodes.  Reproductive: Multiple calcified and noncalcified lesions are present in the uterus, compatible fibroids. No adnexal mass is seen. Other: A trace amount of ascites is noted inferior to the liver and in the perisplenic space. Musculoskeletal: No acute or suspicious osseous abnormality is seen. IMPRESSION: 1. Scattered ground-glass opacities in the lungs bilaterally with dense left lower lobe consolidation, suspicious for multifocal pneumonia. 2. Multiple hypodense masses in the liver, suspicious for metastatic disease, not significantly changed from the prior exam. 3. Cholelithiasis. 4. Uterine fibroids. Electronically Signed   By: Wyvonnia Heimlich M.D.   On: 09/02/2023 17:55   DG Chest Port 1 View Result Date: 09/02/2023 CLINICAL DATA:  Sepsis, low back pain, lethargy, breast cancer EXAM: PORTABLE CHEST 1 VIEW COMPARISON:  05/09/2023 FINDINGS: Right IJ central venous catheter tip: Right atrium. Asymmetric distribution of chest/breast soft tissues and of the breast implants. Even accounting for this there is new consolidation in the retrocardiac region suspicious for pneumonia in this clinical context. There is only some faint linear subsegmental atelectasis or scarring in the included portion of the left lower lobe 5 days ago on the CT abdomen. The right lung appears clear. Bilateral axillary clips noted. Heart size within normal limits. No blunting of the costophrenic angles. IMPRESSION: 1. New consolidation in the retrocardiac region suspicious for left lower lobe pneumonia in this clinical context. Assessment complicated by asymmetric distribution of breast and breast implant soft tissues. 2. Right IJ central venous catheter tip: Right atrium. Electronically Signed   By: Tanner Fanny.D.  On: 09/02/2023 15:20     Labs:   Basic Metabolic Panel: Recent Labs  Lab 09/04/23 0337 09/05/23 0656 09/06/23 0909 09/07/23 0317 09/08/23 0235  NA 131* 139 136 134* 132*  K 3.2* 4.0 4.2 4.7 4.2  CL 99 104  105 101 99  CO2 24 26 21* 24 23  GLUCOSE 148* 124* 102* 131* 127*  BUN 20 30* 30* 29* 34*  CREATININE 1.09* 0.89 1.08* 0.94 0.99  CALCIUM  8.3* 8.9 8.6* 8.4* 8.6*  MG 1.7 2.3 1.9 2.2 2.3  PHOS 1.8* 2.6 3.1 3.0 3.5   GFR Estimated Creatinine Clearance: 62 mL/min (by C-G formula based on SCr of 0.99 mg/dL). Liver Function Tests: Recent Labs  Lab 09/02/23 1443 09/04/23 0337 09/05/23 0656 09/06/23 0909  AST 78* 36 30 30  ALT 55* 35 33 32  ALKPHOS 187* 148* 158* 203*  BILITOT 4.0* 3.8* 2.7* 1.8*  PROT 8.0 6.9 7.3 7.2  ALBUMIN  3.2* 2.5* 2.6* 2.7*   No results for input(s): "LIPASE", "AMYLASE" in the last 168 hours. No results for input(s): "AMMONIA" in the last 168 hours. Coagulation profile No results for input(s): "INR", "PROTIME" in the last 168 hours.  CBC: Recent Labs  Lab 09/02/23 1443 09/02/23 1451 09/03/23 0541 09/04/23 0337 09/06/23 0108 09/07/23 0317 09/08/23 0235  WBC 18.6*  --  22.9* 16.9* 11.1* 11.7* 10.3  NEUTROABS 16.5*  --   --  14.7*  --   --   --   HGB 10.7*   < > 9.4* 8.3* 8.0* 7.9* 8.0*  HCT 32.9*   < > 28.8* 26.1* 25.3* 24.9* 25.8*  MCV 81.4  --  81.8 83.4 88.2 82.2 83.8  PLT 195  --  177 179 236 263 276   < > = values in this interval not displayed.   Cardiac Enzymes: No results for input(s): "CKTOTAL", "CKMB", "CKMBINDEX", "TROPONINI" in the last 168 hours. BNP: Invalid input(s): "POCBNP" CBG: Recent Labs  Lab 09/07/23 0004 09/07/23 0520 09/07/23 1231 09/07/23 2351 09/08/23 0610  GLUCAP 131* 156* 99 119* 129*   D-Dimer No results for input(s): "DDIMER" in the last 72 hours. Hgb A1c No results for input(s): "HGBA1C" in the last 72 hours. Lipid Profile No results for input(s): "CHOL", "HDL", "LDLCALC", "TRIG", "CHOLHDL", "LDLDIRECT" in the last 72 hours. Thyroid  function studies No results for input(s): "TSH", "T4TOTAL", "T3FREE", "THYROIDAB" in the last 72 hours.  Invalid input(s): "FREET3" Anemia work up No results for input(s):  "VITAMINB12", "FOLATE", "FERRITIN", "TIBC", "IRON", "RETICCTPCT" in the last 72 hours. Microbiology Recent Results (from the past 240 hours)  Blood Culture (routine x 2)     Status: Abnormal   Collection Time: 09/02/23  2:43 PM   Specimen: Left Antecubital; Blood  Result Value Ref Range Status   Specimen Description   Final    LEFT ANTECUBITAL BOTTLES DRAWN AEROBIC AND ANAEROBIC Performed at [facility], [address], [place], [place] [address]    Special Requests   Final    Blood Culture adequate volume Performed at [facility], [address] [place], [place] [address]    Culture  Setup Time   Final    GRAM POSITIVE COCCI IN CLUSTERS IN BOTH AEROBIC AND ANAEROBIC BOTTLES CRITICAL RESULT CALLED TO, READ BACK BY AND VERIFIED WITH: PHARMD J LEGGE [id number] AT 954 AM BY CM    Culture (A)  Final    STAPHYLOCOCCUS EPIDERMIDIS THE SIGNIFICANCE OF ISOLATING THIS ORGANISM FROM A SINGLE SET OF BLOOD CULTURES WHEN  MULTIPLE SETS ARE DRAWN IS UNCERTAIN. PLEASE NOTIFY THE MICROBIOLOGY DEPARTMENT WITHIN ONE WEEK IF SPECIATION AND SENSITIVITIES ARE REQUIRED. Performed at [facility], [address], [place], [place] [address]    Report Status 09/05/2023 FINAL  Final  Blood Culture ID Panel (Reflexed)     Status: Abnormal   Collection Time: 09/02/23  2:43 PM  Result Value Ref Range Status   Enterococcus faecalis NOT DETECTED NOT DETECTED Final   Enterococcus Faecium NOT DETECTED NOT DETECTED Final   Listeria monocytogenes NOT DETECTED NOT DETECTED Final   Staphylococcus species DETECTED (A) NOT DETECTED Final    Comment: CRITICAL RESULT CALLED TO, READ BACK BY AND VERIFIED WITH: PHARMD J LEGGE [id number] AT 957 AM BY CM    Staphylococcus aureus (BCID) NOT DETECTED NOT DETECTED Final   Staphylococcus epidermidis DETECTED (A) NOT DETECTED Final    Comment: CRITICAL RESULT CALLED TO, READ BACK BY AND VERIFIED WITH: PHARMD J LEGGE [id number] AT 957 AM BY  CM    Staphylococcus lugdunensis NOT DETECTED NOT DETECTED Final   Streptococcus species NOT DETECTED NOT DETECTED Final   Streptococcus agalactiae NOT DETECTED NOT DETECTED Final   Streptococcus pneumoniae NOT DETECTED NOT DETECTED Final   Streptococcus pyogenes NOT DETECTED NOT DETECTED Final   A.calcoaceticus-baumannii NOT DETECTED NOT DETECTED Final   Bacteroides fragilis NOT DETECTED NOT DETECTED Final   Enterobacterales NOT DETECTED NOT DETECTED Final   Enterobacter cloacae complex NOT DETECTED NOT DETECTED Final   Escherichia coli NOT DETECTED NOT DETECTED Final   Klebsiella aerogenes NOT DETECTED NOT DETECTED Final   Klebsiella oxytoca NOT DETECTED NOT DETECTED Final   Klebsiella pneumoniae NOT DETECTED NOT DETECTED Final   Proteus species NOT DETECTED NOT DETECTED Final   Salmonella species NOT DETECTED NOT DETECTED Final   Serratia marcescens NOT DETECTED NOT DETECTED Final   Haemophilus influenzae NOT DETECTED NOT DETECTED Final   Neisseria meningitidis NOT DETECTED NOT DETECTED Final   Pseudomonas aeruginosa NOT DETECTED NOT DETECTED Final   Stenotrophomonas maltophilia NOT DETECTED NOT DETECTED Final   Candida albicans NOT DETECTED NOT DETECTED Final   Candida auris NOT DETECTED NOT DETECTED Final   Candida glabrata NOT DETECTED NOT DETECTED Final   Candida krusei NOT DETECTED NOT DETECTED Final   Candida parapsilosis NOT DETECTED NOT DETECTED Final   Candida tropicalis NOT DETECTED NOT DETECTED Final   Cryptococcus neoformans/gattii NOT DETECTED NOT DETECTED Final   Methicillin resistance mecA/C NOT DETECTED NOT DETECTED Final    Comment: Performed at [facility], [address], [place], [place] [address]  Resp panel by RT-PCR (RSV, Flu A&B, Covid) Anterior Nasal Swab     Status: None   Collection Time: 09/02/23  2:57 PM   Specimen: Anterior Nasal Swab  Result Value Ref Range Status   SARS Coronavirus 2 by RT PCR NEGATIVE NEGATIVE Final    Comment:  (NOTE) SARS-CoV-2 target nucleic acids are NOT DETECTED.  The SARS-CoV-2 RNA is generally detectable in upper respiratory specimens during the acute phase of infection. The lowest concentration of SARS-CoV-2 viral copies this assay can detect is 138 copies/mL. A negative result does not preclude SARS-Cov-2 infection and should not be used as the sole basis for treatment or other patient management decisions. A negative result may occur with  improper specimen collection/handling, submission of specimen other than nasopharyngeal swab, presence of viral mutation(s) within the areas targeted by this assay, and inadequate number of viral copies(<138 copies/mL). A negative result must be combined with  clinical observations, patient history, and epidemiological information. The expected result is Negative.  Fact Sheet for Patients:  BloggerCourse.com  Fact Sheet for Healthcare Providers:  SeriousBroker.it  This test is no t yet approved or cleared by the [place]  FDA and  has been authorized for detection and/or diagnosis of SARS-CoV-2 by FDA under an Emergency Use Authorization (EUA). This EUA will remain  in effect (meaning this test can be used) for the duration of the COVID-19 declaration under Section 564(b)(1) of the Act, 21 U.S.C.section 360bbb-3(b)(1), unless the authorization is terminated  or revoked sooner.       Influenza A by PCR NEGATIVE NEGATIVE Final   Influenza B by PCR NEGATIVE NEGATIVE Final    Comment: (NOTE) The Xpert Xpress SARS-CoV-2/FLU/RSV plus assay is intended as an aid in the diagnosis of influenza from Nasopharyngeal swab specimens and should not be used as a sole basis for treatment. Nasal washings and aspirates are unacceptable for Xpert Xpress SARS-CoV-2/FLU/RSV testing.  Fact Sheet for Patients: BloggerCourse.com  Fact Sheet for Healthcare  Providers: SeriousBroker.it  This test is not yet approved or cleared by the [place]  FDA and has been authorized for detection and/or diagnosis of SARS-CoV-2 by FDA under an Emergency Use Authorization (EUA). This EUA will remain in effect (meaning this test can be used) for the duration of the COVID-19 declaration under Section 564(b)(1) of the Act, 21 U.S.C. section 360bbb-3(b)(1), unless the authorization is terminated or revoked.     Resp Syncytial Virus by PCR NEGATIVE NEGATIVE Final    Comment: (NOTE) Fact Sheet for Patients: BloggerCourse.com  Fact Sheet for Healthcare Providers: SeriousBroker.it  This test is not yet approved or cleared by the [place]  FDA and has been authorized for detection and/or diagnosis of SARS-CoV-2 by FDA under an Emergency Use Authorization (EUA). This EUA will remain in effect (meaning this test can be used) for the duration of the COVID-19 declaration under Section 564(b)(1) of the Act, 21 U.S.C. section 360bbb-3(b)(1), unless the authorization is terminated or revoked.  Performed at [facility], [address], [place], [place] [address]   MRSA Next Gen by PCR, Nasal     Status: Abnormal   Collection Time: 09/02/23  8:49 PM   Specimen: Nasal Mucosa; Nasal Swab  Result Value Ref Range Status   MRSA by PCR Next Gen DETECTED (A) NOT DETECTED Final    Comment: RESULT CALLED TO, READ BACK BY AND VERIFIED WITH: Gisele Lamas, RN [id number] 09/02/23 BY Gearld Keep (NOTE) The GeneXpert MRSA Assay (FDA approved for NASAL specimens only), is one component of a comprehensive MRSA colonization surveillance program. It is not intended to diagnose MRSA infection nor to guide or monitor treatment for MRSA infections. Test performance is not FDA approved in patients less than 81 years old. Performed at [facility], [address], [place], [place] [address]   Blood Culture (routine x 2)     Status: None   Collection Time: 09/02/23  9:30 PM   Specimen: BLOOD LEFT ARM  Result Value Ref Range Status   Specimen Description BLOOD LEFT ARM  Final   Special Requests   Final    BOTTLES DRAWN AEROBIC ONLY Blood Culture adequate volume   Culture   Final    NO GROWTH 5 DAYS Performed at [facility], [address], [place], [place] [address]    Report Status 09/07/2023 FINAL  Final  Expectorated Sputum Assessment w Gram Stain, Rflx to Resp Cult  Status: None   Collection Time: 09/06/23 12:02 PM   Specimen: Expectorated Sputum  Result Value Ref Range Status   Specimen Description EXPECTORATED SPUTUM  Final   Special Requests NONE  Final   Sputum evaluation   Final    THIS SPECIMEN IS ACCEPTABLE FOR SPUTUM CULTURE Performed at [facility], [address], [place], [place] [address]    Report Status 09/06/2023 FINAL  Final  Culture, Respiratory w Gram Stain     Status: None (Preliminary result)   Collection Time: 09/06/23 12:02 PM  Result Value Ref Range Status   Specimen Description   Final    EXPECTORATED SPUTUM Performed at [facility], [address] [place], [place] [address]    Special Requests   Final    NONE Reflexed from [id number] Performed at [facility], [address] [place], [place] [address]    Gram Stain   Final    ABUNDANT WBC PRESENT, PREDOMINANTLY PMN FEW GRAM POSITIVE COCCI ABUNDANT GRAM NEGATIVE RODS    Culture   Final    MODERATE Normal respiratory flora-no Staph aureus or Pseudomonas seen Performed at [facility], [address] [place], [place] [address]    Report Status PENDING  Incomplete     Discharge Instructions:   Discharge Instructions     Diet general   Complete by: As directed    Increase activity slowly   Complete by: As directed       Allergies as of 09/08/2023       Reactions    Nsaids Other (See Comments)   Crohns disease/ IBD   Chlorhexidine  Gluconate Itching   Able to use CHG swabs for ports, reaction with direct skin contact   Hydromorphone  Other (See Comments)   Confusion/sedation   Lactose Other (See Comments)   Upset stomach/diarrhea   Lactose Intolerance (gi) Other (See Comments)   Upset stomach/diarrhea   Vancomycin  Itching   Red Man's Syndrome        Medication List     TAKE these medications    acetaminophen  325 MG tablet Commonly known as: TYLENOL  Take 2 tablets (650 mg total) by mouth every 6 (six) hours as needed for mild pain (pain score 1-3).   fludrocortisone  0.1 MG tablet Commonly known as: FLORINEF  Take 0.5 tablets (0.05 mg total) by mouth daily. Start taking on: September 09, 2023   IRON-VITAMIN C  PO Take 1 capsule by mouth daily.   levofloxacin 750 MG tablet Commonly known as: LEVAQUIN Take 1 tablet (750 mg total) by mouth daily. Start taking on: September 09, 2023   loperamide  2 MG capsule Commonly known as: IMODIUM  Take 2 mg by mouth 4 (four) times daily as needed for diarrhea or loose stools.   midodrine  10 MG tablet Commonly known as: PROAMATINE  Take 10 mg by mouth 2 (two) times daily.   multivitamin with minerals tablet Take 1 tablet by mouth daily.   sterile water SOLN with amino acids  10 % SOLN 1.3 g/kg, dextrose  70 % SOLN 20 % Inject into the vein See admin instructions. Infuse TPN 3/1 1500 ml IV through PICC Line Via ambulatory infusion pump over 16 Hour with 1 Hour Ramp Up & 1 Hour Ramp Down 3 X Week (MWF). Add 10 ml MVI Daily Prior To Infusion. Bag Volume: 1550 ml  Base Formula           Amount/Day         Amount/L  Dextrose  70%  175.00 Gm           116.67 Gm Plenamine 15%        80.00 Gm             53.33 Gm SMOFlipid 20%        40.00 Gm             26.67 Gm  Electrolytes  Calcium  Glucona     6.00 meq              4.00 meq Magnesium  Sulf      8.00 meq               5.33 meq Sodium Chloride      230 meq                 153.33 meq Potassium Phos     15 mM                    10.00 mM K Acetate                67.00 meq              44.67 meq  Vitamins, trace elements and medications Tralement               1.00 mL                   0.67 mL     14 hours a day          Time coordinating discharge: 45 min  Signed:  Enrigue Harvard DO  [facility] 09/08/2023, 10:57 AM

## 2023-09-09 LAB — CULTURE, RESPIRATORY W GRAM STAIN: Culture: NORMAL

## 2023-09-10 ENCOUNTER — Telehealth: Payer: Self-pay

## 2023-09-10 ENCOUNTER — Other Ambulatory Visit: Payer: Self-pay

## 2023-09-10 NOTE — Telephone Encounter (Signed)
 ----- Message from Nurse Ivar Mariscal sent at 09/10/2023  4:18 PM EDT ----- Regarding: RE: PA PET Message received from [place] today via secure chat:   "I have been all over the place with her ins. I just spoke to someone with [employer]. Basically her policy does not allow a second level of appeal. She said I can wait 45 days to re-request the PET or we can request a different scan."  Per MD, we will cancel scan at this time and wait for US  liver bx. I have sent a message to Weldon Hales to see if there is any sooner availability anywhere for her to have this. If it is confirmed to be mets, we can likely re-order the PET at that time with more proof of the need for it. Pt is aware of this. She states she would like to have the bx without needing transportation as none of her family is aware of this. She states  she does not want to involve them until she has results for certain.  Thanks, Rice Chamorro ----- Message ----- From: Kay Parson, LPN Sent: 4/0/9811  12:22 PM EDT To: Reggie Caper, LPN; Thu Beverlie Buckle, RN; # Subject: RE: PA PET                                     Wow, what a mess. Thank you so much for the update!  -LB ----- Message ----- From: Hillary Lowing Sent: 09/10/2023   9:10 AM EDT To: Reggie Caper, LPN; Thu Beverlie Buckle, RN; # Subject: RE: PA PET                                     Good morning - I wanted to let you know that I contacted [employer] on Friday afternoon to schedule the P2P.  The representative I spoke to stated that her policy requires that I contact her insurance company directly after the appeal was denied. So, I am going to do that in a few and I will let you know what I find out.  Thanks,  Brandi ----- Message ----- From: Percival Brace, NP Sent: 09/07/2023  12:35 PM EDT To: Reggie Caper, LPN; Thu Beverlie Buckle, RN; # Subject: RE: PA PET                                     I am on PAL Monday, and let me know about Tuesday--because I am already overbooked that  day, but can work something out depending on their availability of appointments. ----- Message ----- From: Desota, Kelsey L, RN Sent: 09/07/2023  10:34 AM EDT To: Reggie Caper, LPN; Thu Beverlie Buckle, RN; # Subject: RE: PA PET                                     Can we set up a P2P? [name] can do Monday between 12-1 on his cell phone [phone].  If that's not available- Loris Ros do you have any slots next week Monday or Tuesday to try and do a P2P? ----- Message ----- From: Hillary Lowing Sent: 09/06/2023  12:29 PM EDT To: Johanna Mutter  Cates, LPN; Thu Beverlie Buckle, RN; # Subject: RE: PA PET                                     Good morning - [employer] has denied the appeal for PET.  Clinical was received and I explained to them the need for biopsy. Please let me know if you would like to schedule a P2P.  Reason:  Upon appeal of the denial, the first denial is upheld for the same reason. We have told your provider about this. Please talk to your provider if you have questions.Your doctor told us  that you have been treated for cancer in your breast(s). The request cannot be approved because:  Further imaging can be done when results of standard imaging (such as CT or MRI) did not clearly identify a problem that would explain your symptoms. The notes sent to us  do not show why further imaging is needed.  This finding was based on review of [employer] On[facility] Imaging Guidelines Section(s): Breast Cancer - Restaging/Recurrence (ONC 11.3).  Thank you,  Brandi ----- Message ----- From: Cameron Cea, MD Sent: 09/04/2023   8:13 AM EDT To: Reggie Caper, LPN; Thu Beverlie Buckle, RN; # Subject: RE: PA PET                                     Please let them know that we need to biopsy the most hypermetabolic liver lesion and to make sure we are not biopsying a necrotic area. Thanks VG ----- Message ----- From: Reggie Caper, LPN Sent: 1/61/0960   4:44 PM EDT To: Reggie Caper, LPN; Thu Beverlie Buckle, RN;  # Subject: RE: PA PET                                     Yes Brandi let's appeal please. ----- Message ----- From: Hillary Lowing Sent: 08/31/2023   4:00 PM EDT To: Reggie Caper, LPN; Thu Beverlie Buckle, RN; # Subject: RE: PA PET                                     Good afternoon - they have denied the PET, stating that a MRI would be the next type of imaging the patient would need.  I can appeal the denial if you would like.  Just let me know how you would like to proceed.  Thanks,  Brandi ----- Message ----- From: Terral Ferrari, RN Sent: 08/30/2023   3:37 PM EDT To: Reggie Caper, LPN; Thu Beverlie Buckle, RN; # Subject: PA PET                                         Hey friends, can we work on [name] for this pts PET. [name] is wanting it ASAP if possible. Thanks so much.

## 2023-09-13 ENCOUNTER — Encounter (HOSPITAL_COMMUNITY)

## 2023-09-13 ENCOUNTER — Encounter (HOSPITAL_COMMUNITY): Payer: Self-pay

## 2023-09-18 ENCOUNTER — Inpatient Hospital Stay: Admitting: Hematology and Oncology

## 2023-09-24 ENCOUNTER — Other Ambulatory Visit: Payer: Self-pay | Admitting: Radiology

## 2023-09-24 DIAGNOSIS — Z170 Estrogen receptor positive status [ER+]: Secondary | ICD-10-CM

## 2023-09-25 ENCOUNTER — Ambulatory Visit (HOSPITAL_COMMUNITY)
Admission: RE | Admit: 2023-09-25 | Discharge: 2023-09-25 | Disposition: A | Discharge status: Home / Self Care | Source: Ambulatory Visit | Attending: Hematology and Oncology | Admitting: Hematology and Oncology

## 2023-09-25 ENCOUNTER — Encounter (HOSPITAL_COMMUNITY): Payer: Self-pay

## 2023-09-25 ENCOUNTER — Other Ambulatory Visit: Payer: Self-pay

## 2023-09-25 DIAGNOSIS — Z86018 Personal history of other benign neoplasm: Secondary | ICD-10-CM | POA: Insufficient documentation

## 2023-09-25 DIAGNOSIS — K5090 Crohn's disease, unspecified, without complications: Secondary | ICD-10-CM | POA: Diagnosis not present

## 2023-09-25 DIAGNOSIS — Z170 Estrogen receptor positive status [ER+]: Secondary | ICD-10-CM | POA: Insufficient documentation

## 2023-09-25 DIAGNOSIS — C50411 Malignant neoplasm of upper-outer quadrant of right female breast: Secondary | ICD-10-CM | POA: Insufficient documentation

## 2023-09-25 DIAGNOSIS — C787 Secondary malignant neoplasm of liver and intrahepatic bile duct: Secondary | ICD-10-CM | POA: Insufficient documentation

## 2023-09-25 DIAGNOSIS — Z9049 Acquired absence of other specified parts of digestive tract: Secondary | ICD-10-CM | POA: Diagnosis not present

## 2023-09-25 LAB — COMPREHENSIVE METABOLIC PANEL WITH GFR
ALT: 75 U/L — ABNORMAL HIGH (ref 0–44)
AST: 78 U/L — ABNORMAL HIGH (ref 15–41)
Albumin: 2.9 g/dL — ABNORMAL LOW (ref 3.5–5.0)
Alkaline Phosphatase: 228 U/L — ABNORMAL HIGH (ref 38–126)
Anion gap: 9 (ref 5–15)
BUN: 17 mg/dL (ref 6–20)
CO2: 24 mmol/L (ref 22–32)
Calcium: 9.1 mg/dL (ref 8.9–10.3)
Chloride: 103 mmol/L (ref 98–111)
Creatinine, Ser: 0.92 mg/dL (ref 0.44–1.00)
GFR, Estimated: >60 mL/min (ref >60)
Glucose, Bld: 84 mg/dL (ref 70–99)
Potassium: 3.5 mmol/L (ref 3.5–5.1)
Sodium: 136 mmol/L (ref 135–145)
Total Bilirubin: 0.8 mg/dL (ref 0.0–1.2)
Total Protein: 8 g/dL (ref 6.5–8.1)

## 2023-09-25 LAB — CBC WITH DIFFERENTIAL/PLATELET
Abs Immature Granulocytes: 0.02 10*3/uL (ref 0.00–0.07)
Basophils Absolute: 0 10*3/uL (ref 0.0–0.1)
Basophils Relative: 1 %
Eosinophils Absolute: 0.1 10*3/uL (ref 0.0–0.5)
Eosinophils Relative: 1 %
HCT: 27.1 % — ABNORMAL LOW (ref 36.0–46.0)
Hemoglobin: 8.3 g/dL — ABNORMAL LOW (ref 12.0–15.0)
Immature Granulocytes: 0 %
Lymphocytes Relative: 25 %
Lymphs Abs: 1.5 10*3/uL (ref 0.7–4.0)
MCH: 25.2 pg — ABNORMAL LOW (ref 26.0–34.0)
MCHC: 30.6 g/dL (ref 30.0–36.0)
MCV: 82.1 fL (ref 80.0–100.0)
Monocytes Absolute: 0.5 10*3/uL (ref 0.1–1.0)
Monocytes Relative: 8 %
Neutro Abs: 3.9 10*3/uL (ref 1.7–7.7)
Neutrophils Relative %: 65 %
Platelets: 292 10*3/uL (ref 150–400)
RBC: 3.3 MIL/uL — ABNORMAL LOW (ref 3.87–5.11)
RDW: 13.2 % (ref 11.5–15.5)
WBC: 6 10*3/uL (ref 4.0–10.5)
nRBC: 0 % (ref 0.0–0.2)

## 2023-09-25 LAB — PROTIME-INR
INR: 1.1 (ref 0.8–1.2)
Prothrombin Time: 14.4 s (ref 11.4–15.2)

## 2023-09-25 MED ORDER — SODIUM CHLORIDE 0.9 % IV SOLN: INTRAVENOUS | Status: DC

## 2023-09-25 MED ORDER — FENTANYL CITRATE (PF) 100 MCG/2ML IJ SOLN
INTRAMUSCULAR | Status: AC | PRN
  Administered 2023-09-25 (×2): 50 ug via INTRAVENOUS

## 2023-09-25 MED ORDER — HEPARIN SOD (PORK) LOCK FLUSH 100 UNIT/ML IV SOLN
250.0000 [IU] | INTRAVENOUS | Status: AC | PRN
  Administered 2023-09-25: 250 [IU]
  Filled 2023-09-25: qty 5

## 2023-09-25 MED ORDER — MIDAZOLAM HCL 2 MG/2ML IJ SOLN
INTRAMUSCULAR | Status: AC
  Filled 2023-09-25: qty 2

## 2023-09-25 MED ORDER — MIDAZOLAM HCL 2 MG/2ML IJ SOLN
INTRAMUSCULAR | Status: AC | PRN
  Administered 2023-09-25 (×2): 1 mg via INTRAVENOUS

## 2023-09-25 MED ORDER — LIDOCAINE HCL 1 % IJ SOLN
INTRAMUSCULAR | Status: AC
Start: 2023-09-25 — End: 2023-09-25
  Filled 2023-09-25: qty 20

## 2023-09-25 MED ORDER — FENTANYL CITRATE (PF) 100 MCG/2ML IJ SOLN
INTRAMUSCULAR | Status: AC
Start: 2023-09-25 — End: 2023-09-25
  Filled 2023-09-25: qty 2

## 2023-09-25 NOTE — Procedures (Signed)
 Interventional Radiology Procedure Note  Procedure: U/S Guided Biopsy of liver mass  Complications: None  Estimated Blood Loss: < 10 mL  Findings: 18 G core biopsy of liver mass performed under U/S guidance.  2 core samples obtained and sent to Pathology.  Rosetta Cons, MD

## 2023-09-25 NOTE — Discharge Instructions (Signed)
 Please call Interventional Radiology clinic [phone] with any questions or concerns.   You may remove your dressing and shower tomorrow.   Liver Biopsy, Care After After a liver biopsy, it is common to have these things in the area where the biopsy was done. You may: Have pain. Feel sore. Have bruising. You may also feel tired for a few days. Follow these instructions at home: Medicines Take over-the-counter and prescription medicines only as told by your doctor. If you were prescribed an antibiotic medicine, take it as told by your doctor. Do not stop taking the antibiotic, even if you start to feel better. Do not take medicines that may thin your blood. These medicines include aspirin and ibuprofen. Take them only if your doctor tells you to. If told, take steps to prevent problems with pooping (constipation). You may need to: Drink enough fluid to keep your pee (urine) pale yellow. Take medicines. You will be told what medicines to take. Eat foods that are high in fiber. These include beans, whole grains, and fresh fruits and vegetables. Limit foods that are high in fat and sugar. These include fried or sweet foods. Ask your doctor if you should avoid driving or using machines while you are taking your medicine. Caring for your incision Follow instructions from your doctor about how to take care of your cut from surgery (incisions). Make sure you: Wash your hands with soap and water for at least 20 seconds before and after you change your bandage. If you cannot use soap and water, use hand sanitizer. Change your bandage. Leavestitches or skin glue in place for at least two weeks. Leave tape strips alone unless you are told to take them off. You may trim the edges of the tape strips if they curl up. Check your incision every day for signs of infection. Check for: Redness, swelling, or more pain. Fluid or blood. Warmth. Pus or a bad smell. Do not take baths, swim, or use a  hot tub. Ask your doctor about taking showers or sponge baths. Activity Rest at home for 1-2 days, or as told by your doctor. Get up to take short walks every 1 to 2 hours. Ask for help if you feel weak or unsteady. Do not lift anything that is heavier than 10 lb (4.5 kg), or the limit that you are told. Do not play contact sports for 2 weeks after the procedure. Return to your normal activities as told by your doctor. Ask what activities are safe for you. General instructions  Do not drink alcohol in the first week after the procedure. Plan to have a responsible adult care for you for the time you are told after you leave the hospital or clinic. This is important. It is up to you to get the results of your procedure. Ask how to get your results when they are ready. Keep all follow-up visits.   Contact a doctor if: You have more bleeding in your incision. Your incision swells, or is red and more painful. You have fluid that comes from your incision. You develop a rash. You have fever or chills. Get help right away if: You have swelling, bloating, or pain in your belly (abdomen). You get dizzy or faint. You vomit or you feel like vomiting. You have trouble breathing or feel short of breath. You have chest pain. You have problems talking or seeing. You have trouble with your balance or moving your arms or legs. These symptoms may be an emergency. Get help  right away. Call your local emergency services (911 in the [place] Do not wait to see if the symptoms will go away. Do not drive yourself to the hospital. Summary After the procedure, it is common to have pain, soreness, bruising, and tiredness. Your doctor will tell you how to take care of yourself at home. Change your bandage, take your medicines, and limit your activities as told by your doctor. Call your doctor if you have symptoms of infection. Get help right away if your belly swells, your cut bleeds a lot, or you have trouble  talking or breathing. This information is not intended to replace advice given to you by your health care provider. Make sure you discuss any questions you have with your healthcare provider. Document Revised: 02/09/2020 Document Reviewed: 02/09/2020 Elsevier Patient Education  2022 [employer]     Moderate Conscious Sedation, Adult, Care After This sheet gives you information about how to care for yourself after your procedure. Your health care provider may also give you more specific instructions. If you have problems or questions, contact your health care provider. What can I expect after the procedure? After the procedure, it is common to have: Sleepiness for several hours. Impaired judgment for several hours. Difficulty with balance. Vomiting if you eat too soon. Follow these instructions at home: For the time period you were told by your health care provider: Rest. Do not participate in activities where you could fall or become injured. Do not drive or use machinery. Do not drink alcohol. Do not take sleeping pills or medicines that cause drowsiness. Do not make important decisions or sign legal documents. Do not take care of children on your own.        Eating and drinking Follow the diet recommended by your health care provider. Drink enough fluid to keep your urine pale yellow. If you vomit: Drink water, juice, or soup when you can drink without vomiting. Make sure you have little or no nausea before eating solid foods.    General instructions Take over-the-counter and prescription medicines only as told by your health care provider. Have a responsible adult stay with you for the time you are told. It is important to have someone help care for you until you are awake and alert. Do not smoke. Keep all follow-up visits as told by your health care provider. This is important. Contact a health care provider if: You are still sleepy or having trouble with balance after  24 hours. You feel light-headed. You keep feeling nauseous or you keep vomiting. You develop a rash. You have a fever. You have redness or swelling around the IV site. Get help right away if: You have trouble breathing. You have new-onset confusion at home. Summary After the procedure, it is common to feel sleepy, have impaired judgment, or feel nauseous if you eat too soon. Rest after you get home. Know the things you should not do after the procedure. Follow the diet recommended by your health care provider and drink enough fluid to keep your urine pale yellow. Get help right away if you have trouble breathing or new-onset confusion at home. This information is not intended to replace advice given to you by your health care provider. Make sure you discuss any questions you have with your health care provider. Document Revised: 07/25/2019 Document Reviewed: 02/20/2019 Elsevier Patient Education  2021 [employer].

## 2023-09-25 NOTE — H&P (Signed)
 Chief Complaint: Metastatic right breast cancer, new liver lesions; referred for image guided liver lesion biopsy  Referring Provider(s): Gudena,V  Supervising Physician: Susan Ensign  Patient Status: [facility] - Out-pt  History of Present Illness: [name] is a 48 y.o. female with PMH sig for colonic stricture/Chron's colitis with prior subtotal colectomy/ileostomy, anemia, uterine fibroids, vit D def, and metastatic right breast cancer, initially diagnosed in 2022. She now presents with imaging findings of multiple liver lesions and is scheduled today for image guided liver lesion biopsy. She is known to IR team from paracenteses 2024, central venous cath placement 2024, abd drain placement 2024 with removal in Jan 2025.    Patient is Full Code  Past Medical History:  Diagnosis Date   Breast cancer (HCC)    right   Colon stricture (HCC) 05/07/2019   Colonic stricture (HCC) 11/19/2022   Crohn's colitis, with intestinal obstruction (HCC) 09/17/2013   Dx 2006 - right and left colon involved 2011 - pan-colitis 09/17/2013 left colitis 60-20 cm March 2021 subtotal colectomy ileosigmoid anastomosis for colonic strictures-Dr. Andy Bannister     Crohn's disease [facility])    Exacerbation of Crohn's disease of large intestine (HCC) 12/13/2022   Family history of breast cancer 06/11/2020   History of abdominal subtotal colectomy 11/19/2022   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 10/17/2010   Rectal bleeding    Uterine fibroid    Vitamin D  deficiency 09/24/2013    Past Surgical History:  Procedure Laterality Date   AXILLARY LYMPH NODE DISSECTION Right 11/09/2020   Procedure: RIGHT AXILLARY LYMPH NODE DISSECTION;  Surgeon: Enid Harry, MD;  Location: [facility];  Service: General;  Laterality: Right;   BIOPSY  07/01/2020   Procedure: BIOPSY;  Surgeon: Ace Holder, MD;  Location: [facility];  Service: Gastroenterology;;    BIOPSY  01/27/2021   Procedure: BIOPSY;  Surgeon: Ace Holder, MD;  Location: [facility];  Service: Gastroenterology;;   BIOPSY  09/07/2021   Procedure: BIOPSY;  Surgeon: Lajuan Pila, MD;  Location: [facility];  Service: Gastroenterology;;   BIOPSY  11/21/2022   Procedure: BIOPSY;  Surgeon: Normie Becton., MD;  Location: [facility];  Service: Gastroenterology;;   BREAST BIOPSY Right 03/24/2022   US  RT BREAST BX W LOC DEV 1ST LESION IMG BX SPEC US  GUIDE 03/24/2022 [facility]   BREAST BIOPSY  04/28/2022   US  RT RADIOACTIVE SEED LOC 04/28/2022 [facility]   BREAST IMPLANT REMOVAL Right 08/07/2022   Procedure: REMOVAL  OF RIGHT BREAST IMPLANTS;  Surgeon: Barb Bonito, MD;  Location: [facility];  Service: Plastics;  Laterality: Right;   BREAST RECONSTRUCTION WITH PLACEMENT OF TISSUE EXPANDER AND FLEX HD (ACELLULAR HYDRATED DERMIS) Bilateral 11/09/2020   Procedure: BILATERAL BREAST RECONSTRUCTION WITH PLACEMENT OF TISSUE EXPANDER AND FLEX HD (ACELLULAR HYDRATED DERMIS);  Surgeon: Barb Bonito, MD;  Location: [facility];  Service: Plastics;  Laterality: Bilateral;   CAPSULOTOMY Bilateral 07/25/2021   Procedure: CAPSULOTOMY;  Surgeon: Barb Bonito, MD;  Location:  [facility];  Service: Plastics;  Laterality: Bilateral;   COLECTOMY  06/27/2019   COLONOSCOPY  2015   ESOPHAGOGASTRODUODENOSCOPY (EGD) WITH PROPOFOL  N/A 11/21/2022   Procedure: ESOPHAGOGASTRODUODENOSCOPY (EGD) WITH PROPOFOL ;  Surgeon: Brice Campi Albino Alu., MD;  Location: [facility];  Service: Gastroenterology;  Laterality: N/A;   FLEXIBLE SIGMOIDOSCOPY N/A 07/01/2020   Procedure: FLEXIBLE SIGMOIDOSCOPY;  Surgeon: Ace Holder, MD;  [facility]: [facility];  Service:  Gastroenterology;  Laterality: N/A;   FLEXIBLE SIGMOIDOSCOPY N/A 01/27/2021   Procedure: FLEXIBLE SIGMOIDOSCOPY;  Surgeon: Ace Holder, MD;  Location: [facility];  Service:  Gastroenterology;  Laterality: N/A;   FLEXIBLE SIGMOIDOSCOPY N/A 09/07/2021   Procedure: FLEXIBLE SIGMOIDOSCOPY;  Surgeon: Lajuan Pila, MD;  Location: [facility];  Service: Gastroenterology;  Laterality: N/A;   FLEXIBLE SIGMOIDOSCOPY N/A 11/21/2022   Procedure: FLEXIBLE SIGMOIDOSCOPY;  Surgeon: Brice Campi Albino Alu., MD;  Location: [facility];  Service: Gastroenterology;  Laterality: N/A;   IR CATHETER TUBE CHANGE  03/07/2023   IR FLUORO GUIDE CV LINE RIGHT  01/09/2023   IR PATIENT EVAL TECH 0-60 MINS  04/24/2023   IR RADIOLOGIST EVAL & MGMT  04/13/2023   IR RADIOLOGIST EVAL & MGMT  05/07/2023   IR US  GUIDE VASC ACCESS RIGHT  01/09/2023   LAPAROTOMY N/A 12/14/2022   Procedure: EXPLORATORY LAPAROTOMY WITH SMALL BOWEL RESECTION;  Surgeon: Cannon Champion, MD;  Location: [facility];  Service: General;  Laterality: N/A;   LAPAROTOMY N/A 12/16/2022   Procedure: REOPENING LAPAROTOMY, ILEOSTOMY;  ABDOMINAL CLOSURE;  Surgeon: Cannon Champion, MD;  Location: [facility];  Service: General;  Laterality: N/A;   NIPPLE SPARING MASTECTOMY Bilateral 11/09/2020   Procedure: BILATERAL NIPPLE SPARING MASTECTOMY;  Surgeon: Enid Harry, MD;  Location: [facility];  Service: General;  Laterality: Bilateral;   PORT-A-CATH REMOVAL N/A 07/25/2021   Procedure: REMOVAL PORT-A-CATH;  Surgeon: Barb Bonito, MD;  Location: [facility];  Service: Plastics;  Laterality: N/A;   PORT-A-CATH REMOVAL Left 01/05/2023   Procedure: REMOVAL PORT-A-CATH;  Surgeon: Oza Blumenthal, MD;  Location: [facility];  Service: General;  Laterality: Left;   PORTACATH PLACEMENT N/A 06/23/2020   Procedure: INSERTION PORT-A-CATH;  Surgeon: Enid Harry, MD;  Location: [facility];  Service: General;  Laterality: N/A;  START TIME OF 11:00 AM FOR 60 MINUTES [place] [facility]   PORTACATH PLACEMENT Left 05/02/2022   Procedure: INSERTION PORT-A-CATH;  Surgeon: Enid Harry, MD;  Location: [facility];  Service: General;  Laterality: Left;   RADIOACTIVE SEED GUIDED EXCISIONAL BREAST BIOPSY Right 05/02/2022   Procedure: RADIOACTIVE SEED GUIDED RIGHT BREAST MASS EXCISION;  Surgeon: Enid Harry, MD;  Location: [facility];  Service: General;  Laterality: Right;   REMOVAL OF BILATERAL TISSUE EXPANDERS WITH PLACEMENT OF BILATERAL BREAST IMPLANTS Bilateral 07/25/2021   Procedure: REMOVAL OF BILATERAL TISSUE EXPANDERS WITH PLACEMENT OF BILATERAL BREAST IMPLANTS;  Surgeon: Barb Bonito, MD;  Location: [facility];  Service: Plastics;  Laterality: Bilateral;  1.5    Allergies: Nsaids, Chlorhexidine  gluconate, Hydromorphone , Lactose, Lactose intolerance (gi), and Vancomycin   Medications: Prior to Admission medications   Medication Sig Start Date End Date Taking? Authorizing Provider  acetaminophen  (TYLENOL ) 325 MG tablet Take 2 tablets (650 mg total) by mouth every 6 (six) hours as needed for mild pain (pain score 1-3). 09/08/23  Yes Vann, Jessica U, DO  fludrocortisone  (FLORINEF ) 0.1 MG tablet Take 0.5 tablets (0.05 mg total) by mouth daily. 09/09/23  Yes Vann, Jessica U, DO  IRON-VITAMIN C  PO Take 1 capsule by mouth daily.   Yes [provider]  levofloxacin  (LEVAQUIN ) 750 MG tablet Take 1 tablet (750 mg total) by mouth daily. 09/09/23  Yes Vann, Jessica U, DO  loperamide  (IMODIUM ) 2 MG capsule Take 2 mg by mouth 4 (four) times daily as needed for diarrhea or loose stools. 08/29/23 08/28/24 Yes [provider]  midodrine  (PROAMATINE ) 10 MG tablet Take  10 mg by mouth 2 (two) times daily. 08/27/23  Yes [provider]  Multiple Vitamins-Minerals (MULTIVITAMIN WITH MINERALS) tablet Take 1 tablet by mouth daily.   Yes [provider]  sterile water SOLN with amino acids  10 % SOLN 1.3 g/kg, dextrose  70 % SOLN 20 % Inject into the vein See admin instructions. Infuse TPN 3/1 1500 ml IV through PICC Line Via ambulatory infusion  pump over 16 Hour with 1 Hour Ramp Up & 1 Hour Ramp Down 3 X Week (MWF). Add 10 ml MVI Daily Prior To Infusion. Bag Volume: 1550 ml  Base Formula           Amount/Day         Amount/L  Dextrose  70%          175.00 Gm           116.67 Gm Plenamine 15%        80.00 Gm             53.33 Gm SMOFlipid 20%        40.00 Gm             26.67 Gm  Electrolytes  Calcium  Glucona     6.00 meq              4.00 meq Magnesium  Sulf      8.00 meq               5.33 meq Sodium Chloride      230 meq                153.33 meq Potassium Phos     15 mM                    10.00 mM K Acetate                67.00 meq              44.67 meq  Vitamins, trace elements and medications Tralement               1.00 mL                   0.67 mL     14 hours a day   Yes [provider]     Family History  Problem Relation Age of Onset   Diabetes Father    Breast cancer Maternal Aunt        dx unknown age   Diabetes Paternal Aunt    Breast cancer Cousin        maternal cousin; dx unknown age   Esophageal cancer Neg Hx    Rectal cancer Neg Hx    Stomach cancer Neg Hx    Colon polyps Neg Hx    Colon cancer Neg Hx     Social History   Socioeconomic History   Marital status: Single    Spouse name: Not on file   Number of children: 1   Years of education: 13   Highest education level: Not on file  Occupational History   Occupation: Medical Coder   Tobacco Use   Smoking status: Former    Types: Cigarettes   Smokeless tobacco: Never  Vaping Use   Vaping status: Never Used  Substance and Sexual Activity   Alcohol use: Not Currently    Alcohol/week: 1.0 - 2.0 standard drink of alcohol    Types: 1 - 2 Glasses of wine per week  Drug use: No   Sexual activity: Yes    Partners: Male    Comment: Intermittent protection not consistent  Other Topics Concern   Not on file  Social History Narrative   Single, 1 daughter born 2001 approximately   Medical coder for [employer]   She is a former  smoker, occasional alcohol no drug use   Social Drivers of Corporate investment banker Strain: Not on file  Food Insecurity: No Food Insecurity (05/13/2023)   Hunger Vital Sign    Worried About Running Out of Food in the Last Year: Never true    Ran Out of Food in the Last Year: Never true  Transportation Needs: No Transportation Needs (05/13/2023)   PRAPARE - Administrator, Civil Service (Medical): No    Lack of Transportation (Non-Medical): No  Physical Activity: Not on file  Stress: Not on file  Social Connections: Not on file       Review of Systems denies fever,HA,CP,dyspnea, cough, abd/back pain, N/V or bleeding  Vital Signs: BP 114/79 (BP Location: Left Arm)   Pulse 92   Temp 98.3 F (36.8 C) (Oral)   Resp 16   LMP 04/24/2021   SpO2 99%   Advance Care Plan: no documents on file  Physical Exam: awake/alert; chest- CTA bilat; intact rt chest wall CVC; heart- RRR; abd-soft,+BS,NT, intact ostomy ; no LE edema  Imaging: DG Chest Port 1 View Result Date: 09/04/2023 CLINICAL DATA:  Acute respiratory failure.\ 5626. EXAM: PORTABLE CHEST 1 VIEW COMPARISON:  Portable chest 09/02/2023, chest CT also 09/02/2023 FINDINGS: 4:45 a.m. Right IJ infusion catheter terminates in the upper right atrium. There is artifact from an overlying right breast prosthesis and a left breast implant. There are postsurgical changes in the left axilla, left chest wall. There is continued left lower lobe consolidation and a small left pleural effusion. Remaining lungs are clear. The cardiomediastinal silhouette and central vessels are normal. No new osseous findings. IMPRESSION: 1. Continued left lower lobe consolidation and small left pleural effusion. Overall aeration seems unchanged. 2. Right IJ infusion catheter terminates in the upper right atrium. Electronically Signed   By: Denman Fischer M.D.   On: 09/04/2023 06:57   CT CHEST ABDOMEN PELVIS WO CONTRAST Result Date: 09/02/2023 CLINICAL  DATA:  Abdominal pain, acute nonlocalized. Lower left-sided back pain worsening with movement or palpation. Tachycardia, lethargy. EXAM: CT CHEST, ABDOMEN AND PELVIS WITHOUT CONTRAST TECHNIQUE: Multidetector CT imaging of the chest, abdomen and pelvis was performed following the standard protocol without IV contrast. RADIATION DOSE REDUCTION: This exam was performed according to the departmental dose-optimization program which includes automated exposure control, adjustment of the mA and/or kV according to patient size and/or use of iterative reconstruction technique. COMPARISON:  08/28/2023. FINDINGS: CT CHEST FINDINGS Cardiovascular: The heart is normal in size and there is a trace pericardial effusion. The distal tip of a right central venous catheter terminates in the right atrium. The aorta and pulmonary trunk are normal in caliber. No mediastinal or axillary lymphadenopathy is seen. Mediastinum/Nodes: No mediastinal or axillary lymphadenopathy seen. Evaluation of hila is limited due to lack of IV contrast. The thyroid  gland, trachea, and esophagus are within normal limits. Lungs/Pleura: Consolidation is noted in left lower lobe. Scattered ground-glass opacities are noted in the lungs bilaterally. Fibrotic changes are noted in the anterior aspect of the right lung, which may be related to prior radiation therapy. No effusion or pneumothorax is seen. Musculoskeletal: There is evidence of prior right mastectomy.  A breast implant is noted on the left. No acute or suspicious osseous abnormality is seen. CT ABDOMEN PELVIS FINDINGS Hepatobiliary: Hypodense masslike regions are present in the liver measuring up to 5.2 cm, not significantly changed from the prior exam. Stones are present within the gallbladder. There is no biliary ductal dilatation. Pancreas: Unremarkable. No pancreatic ductal dilatation or surrounding inflammatory changes. Spleen: Normal in size without focal abnormality. Adrenals/Urinary Tract: The  adrenal glands are within normal limits. No renal calculus are obstructive uropathy is seen bilaterally. The bladder is unremarkable. Stomach/Bowel: The stomach is within normal limits. There is evidence of prior subtotal colectomy with left lower quadrant ileostomy. No bowel obstruction, free air, or pneumatosis is seen. Vascular/Lymphatic: No significant vascular findings are present. No enlarged abdominal or pelvic lymph nodes. Reproductive: Multiple calcified and noncalcified lesions are present in the uterus, compatible fibroids. No adnexal mass is seen. Other: A trace amount of ascites is noted inferior to the liver and in the perisplenic space. Musculoskeletal: No acute or suspicious osseous abnormality is seen. IMPRESSION: 1. Scattered ground-glass opacities in the lungs bilaterally with dense left lower lobe consolidation, suspicious for multifocal pneumonia. 2. Multiple hypodense masses in the liver, suspicious for metastatic disease, not significantly changed from the prior exam. 3. Cholelithiasis. 4. Uterine fibroids. Electronically Signed   By: Wyvonnia Heimlich M.D.   On: 09/02/2023 17:55   DG Chest Port 1 View Result Date: 09/02/2023 CLINICAL DATA:  Sepsis, low back pain, lethargy, breast cancer EXAM: PORTABLE CHEST 1 VIEW COMPARISON:  05/09/2023 FINDINGS: Right IJ central venous catheter tip: Right atrium. Asymmetric distribution of chest/breast soft tissues and of the breast implants. Even accounting for this there is new consolidation in the retrocardiac region suspicious for pneumonia in this clinical context. There is only some faint linear subsegmental atelectasis or scarring in the included portion of the left lower lobe 5 days ago on the CT abdomen. The right lung appears clear. Bilateral axillary clips noted. Heart size within normal limits. No blunting of the costophrenic angles. IMPRESSION: 1. New consolidation in the retrocardiac region suspicious for left lower lobe pneumonia in this  clinical context. Assessment complicated by asymmetric distribution of breast and breast implant soft tissues. 2. Right IJ central venous catheter tip: Right atrium. Electronically Signed   By: Freida Jes M.D.   On: 09/02/2023 15:20   CT ABDOMEN W WO CONTRAST Result Date: 08/29/2023 EXAMINATION: CT ABDOMEN W WO CONTRAST CLINICAL INDICATION: Female, 49 years old. Liver lesion TECHNIQUE: Axial CT of the abdomen with and without 100 cc Omnipaque  300 intravenous contrast. Multiplanar reformations provided. Unless otherwise specified, incidental thyroid , adrenal, renal lesions do not require dedicated imaging follow up. Additionally, any mentioned pulmonary nodules do not require dedicated imaging follow-up based on the Fleischner guidelines unless otherwise specified. Coronary calcifications are not identified unless otherwise specified. COMPARISON: 05/09/2023 FINDINGS: Right mastectomy noted. Left breast implant noted. The heart is normal in size. Subsegmental atelectatic changes are noted in the lung bases. The liver contains multiple new metastases with the largest in segment 4 measuring 5.2 cm. The gallbladder contains calcified stones. The spleen is normal. The pancreas is normal. The adrenals are normal. Mild right renal cortical scarring noted. Left renal cyst is seen. Abdominal aorta is normal in caliber. Left-sided ostomy noted. Visualized bowel loops appear within normal limits. No free fluid or adenopathy. No acute osseous abnormalities. IMPRESSION: Multiple new hepatic metastases. DOSE REDUCTION: This exam was performed according to our departmental dose-optimization program which includes automated exposure  control, adjustment of the mA and/or kV according to patient size and/or use of iterative reconstruction technique. Electronically signed by: Italy Engel MD 08/29/2023 06:45 AM EDT RP Workstation: [id number]    Labs:  CBC: Recent Labs    09/06/23 0108 09/07/23 0317 09/08/23 0235  09/25/23 1141  WBC 11.1* 11.7* 10.3 6.0  HGB 8.0* 7.9* 8.0* 8.3*  HCT 25.3* 24.9* 25.8* 27.1*  PLT 236 263 276 292    COAGS: Recent Labs    12/22/22 1343 12/23/22 0520 12/24/22 0408 12/30/22 1338 01/11/23 1143 02/11/23 1733 05/09/23 1625  INR 1.3*   < > 1.3* 1.3* 1.2 1.1 1.2  APTT 33  --  36 38*  --   --  40*   < > = values in this interval not displayed.    BMP: Recent Labs    09/05/23 0656 09/06/23 0909 09/07/23 0317 09/08/23 0235  NA 139 136 134* 132*  K 4.0 4.2 4.7 4.2  CL 104 105 101 99  CO2 26 21* 24 23  GLUCOSE 124* 102* 131* 127*  BUN 30* 30* 29* 34*  CALCIUM  8.9 8.6* 8.4* 8.6*  CREATININE 0.89 1.08* 0.94 0.99  GFRNONAA >60 >60 >60 >60    LIVER FUNCTION TESTS: Recent Labs    09/02/23 1443 09/04/23 0337 09/05/23 0656 09/06/23 0909  BILITOT 4.0* 3.8* 2.7* 1.8*  AST 78* 36 30 30  ALT 55* 35 33 32  ALKPHOS 187* 148* 158* 203*  PROT 8.0 6.9 7.3 7.2  ALBUMIN  3.2* 2.5* 2.6* 2.7*    TUMOR MARKERS: No results for input(s): AFPTM, CEA, CA199, CHROMGRNA in the last 8760 hours.  Assessment and Plan: 48 y.o. female with PMH sig for colonic stricture/Chron's colitis with prior subtotal colectomy/ileostomy, anemia, uterine fibroids, vit D def, and metastatic right breast cancer, initially diagnosed in 2022. She now presents with imaging findings of multiple liver lesions and is scheduled today for image guided liver lesion biopsy. She is known to IR team from paracenteses 2024, central venous cath placement 2024, abd drain placement 2024 with removal in Jan 2025.Risks and benefits of procedure was discussed with the patient  including, but not limited to bleeding, infection, damage to adjacent structures or low yield requiring additional tests.  All of the questions were answered and there is agreement to proceed.  Consent signed and in chart.    Thank you for allowing our service to participate in KORTNY LIRETTE 's care.  Electronically  Signed: D. Honore Lux, PA-C   09/25/2023, 11:59 AM      I spent a total of    25 Minutes in face to face in clinical consultation, greater than 50% of which was counseling/coordinating care for image guided liver lesion biopsy

## 2023-10-01 LAB — SURGICAL PATHOLOGY

## 2023-10-02 ENCOUNTER — Other Ambulatory Visit: Payer: Self-pay | Admitting: *Deleted

## 2023-10-02 ENCOUNTER — Inpatient Hospital Stay: Attending: Hematology and Oncology | Admitting: Hematology and Oncology

## 2023-10-02 DIAGNOSIS — C50411 Malignant neoplasm of upper-outer quadrant of right female breast: Secondary | ICD-10-CM

## 2023-10-02 DIAGNOSIS — Z170 Estrogen receptor positive status [ER+]: Secondary | ICD-10-CM | POA: Diagnosis not present

## 2023-10-02 DIAGNOSIS — Z79899 Other long term (current) drug therapy: Secondary | ICD-10-CM

## 2023-10-02 NOTE — Progress Notes (Signed)
 HEMATOLOGY-ONCOLOGY TELEPHONE VISIT PROGRESS NOTE  I connected with our patient on 10/02/23 at  2:15 PM EDT by telephone and verified that I am speaking with the correct person using two identifiers.  I discussed the limitations, risks, security and privacy concerns of performing an evaluation and management service by telephone and the availability of in person appointments.  I also discussed with the patient that there may be a patient responsible charge related to this service. The patient expressed understanding and agreed to proceed.   History of Present Illness:  History of Present Illness [name] is a 48 year old female with metastatic breast cancer who presents for follow-up after recent hospitalization for pneumonia.  She was discharged on May 31st after treatment for pneumonia. Her metastatic breast cancer treatment history includes Herceptin  and Perjeta  two years ago, and Kadcyla , which was discontinued due to tolerance issues. She is not currently on chemotherapy.  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 She relies on total parenteral nutrition via a PICC line.  She expresses concerns about nausea and fatigue related to previous treatments and is cautious about new treatments affecting her bowel function, particularly regarding diarrhea.    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 palpable right breast mass measuring about 7 cm x 5 and half centimeters, palpable right axillary lymph node with concern for skin invasion    06/08/2020 Cancer Staging   Staging  form: Breast, AJCC 8th Edition - Clinical: Stage IIIB (cT4d, cN1, cM0, G3, ER+, PR-, HER2+) - Signed by Odean Potts, MD on 07/07/2020 Histologic grading system: 3 grade system   06/24/2020 Genetic Testing   No pathogenic variants detected in [facility]RNA[facility].  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  The report date is June 24, 2020.   The CustomNext-Cancer+RNAinsight panel offered by [employer] includes sequencing and rearrangement analysis for the following 47 genes:  APC, ATM, AXIN2, BARD1, BMPR1A, BRCA1, BRCA2, BRIP1, CDH1, CDK4, CDKN2A, CHEK2, DICER1, EPCAM, GREM1, HOXB13, MEN1, MLH1, MSH2, MSH3, MSH6, MUTYH, NBN, NF1, NF2, NTHL1, PALB2, PMS2, POLD1, POLE, PTEN, RAD51C, RAD51D, RECQL, RET, SDHA, SDHAF2, SDHB, SDHC, SDHD, SMAD4, SMARCA4, STK11, TP53, TSC1, TSC2, and VHL.  RNA data is routinely analyzed for use in variant interpretation for all genes.   06/25/2020 - 10/29/2020 Chemotherapy   TCHP x6 cycles   11/09/2020 Surgery   Bilateral mastectomies: Left mastectomy: Benign Right mastectomy: Residual microinvasive cancer status post neoadjuvant therapy, DCIS, 0/12 lymph nodes, ER +1%, PR negative, HER2 equivocal 2+ by IHC   11/19/2020 - 07/15/2021 Chemotherapy   Patient is on Treatment Plan : BREAST Trastuzumab   + Pertuzumab  q21d x 13 cycles     01/11/2021 - 03/09/2021 Radiation Therapy   Site Technique Total Dose (Gy) Dose per Fx (Gy) Completed Fx Beam Energies  Chest Wall, Right: CW_Rt 3D 50.4/50.4 1.8 28/28 10X  Chest Wall, Right: CW_Rt_SCLV 3D 50.4/50.4 1.8 28/28 6X, 10X  Chest Wall, Right: CW_Rt_Bst Electron 10/10 2 5/5 6E     06/08/2021 - 06/24/2021 Anti-estrogen oral therapy   Tamoxifen  (discontinued)   03/24/2022  Relapse/Recurrence   Right chest palpable nodule: 1.2 cm mass at 10 o'clock position biopsy revealed grade 2 IDC with high-grade DCIS ER 20% positive weak staining, PR 0%, HER2 3+ positive, Ki-67 50%   04/06/2022  Imaging   CT chest/abd/pelvis  IMPRESSION: 1. No evidence of metastatic disease in the chest, abdomen or pelvis. 2. Similar probable postradiation change in the peripheral anterior right upper lobe, anterior right middle lobe, lateral right lower lobe and right lung apex. 3. Surgical changes of subtotal colectomy with short segment wall thickening of the neo terminal ileum and short segments of small bowel throughout the abdomen pelvis, with interposed gas and fluid-filled distended loops of small bowel intermixed between the areas of inflammation. Findings are compatible active inflammatory Crohn's enteritis. 4. Small volume right lower quadrant and pelvic free fluid, likely reactive.     Electronically Signed   By: Reyes Holder M.D.   On: 04/06/2022 12:53     05/02/2022 Surgery   Right lumpectomy: Recurrent grade 3 IDC, posterior margin focally involved by cancer.   05/17/2022 - 09/21/2022 Chemotherapy   Patient is on Treatment Plan : BREAST ADO-Trastuzumab Emtansine  (Kadcyla ) q21d       REVIEW OF SYSTEMS:   Constitutional: Denies fevers, chills or abnormal weight loss All other systems were reviewed with the patient and are negative. 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 palpable right breast mass measuring about 7 cm x 5 and half centimeters, palpable right axillary lymph node with concern for skin invasion    Treatment Plan: 1. Neoadjuvant chemo with TCHP q 3 weeks X 6 followed by HP completed 07/07/2021 2. Bilateral mastectomies: Left mastectomy: Benign Right mastectomy: Residual microinvasive cancer status post neoadjuvant therapy,  DCIS, 0/12 lymph nodes, ER +1%, PR negative, HER2 equivocal 2+ by IHC 3. Adj XRT completed 02/23/2021 4. Adj Anti-estrogen therapy with tamoxifen  started 06/08/2021 discontinued 06/24/2021 (ER was only 1% therefore we felt risks outweigh benefits) 5. Neratinib: Because of her bowel issues she decided not to take it. 6. Right chest palpable nodule: 1.2 cm mass at 10 o'clock position biopsy revealed grade 2 IDC with high-grade DCIS ER 20% positive weak staining, PR 0%, HER2 3+ positive, Ki-67 50% 7. 05/02/2022: Right lumpectomy: Recurrent grade 3 IDC, posterior margin focally involved by cancer April 2024: Skin right breast excision: Benign 8.  Kadcyla  x 5 cycles completed 05/17/2022- 09/21/2022 (stopped because of intolerance) --------------------------------------------------------------------------------------------------------- Hospitalization 05/18/2021-06/13/2021 (small bowel obstruction)   CT abdomen and pelvis 08/28/2023: Multiple new hepatic metastases largest measuring 5.2 cm  Liver biopsy: 09/25/2023: Poorly differentiated carcinoma compatible with metastatic carcinoma of breast origin ER 0%, PR 0%, Ki67 20%, HER2 +3+   Recommendation: Based on risks and benefits we decided that Enhertu or Tucatinib would be very toxic for her and therefore we will initiate treatment with Herceptin  through her PICC line once every 3 weeks starting next week.  Scans in 3 months to determine if more intense treatment would be required. Hospitalization: 09/02/2023-09/08/2023: Pneumonia, hypotension   Assessment & Plan Metastatic breast cancer HER2-positive with liver metastasis. Herceptin  chosen for lower risk profile. Enhertu deferred due to nausea and fatigue risks. Tucatinib deferred due to diarrhea  risk. - Initiate Herceptin  via PICC line. - Education officer, environmental and scheduling for Herceptin . - Consider Enhertu if tolerated in future. - Repeat imaging in 2-3 months for efficacy.  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 Herceptin  expected to be better tolerated.  Pneumonia Recent hospitalization. Herceptin  chosen to avoid increased infection risk.      I discussed the assessment and treatment plan with the patient. The patient was provided an opportunity to ask questions and all were answered. The patient agreed with the plan and demonstrated an understanding of the instructions. The patient was advised to call back or seek an in-person evaluation if the symptoms worsen or if the condition fails to improve as anticipated.   I provided 20 minutes of non-face-to-face time during this encounter.  This includes time for charting and coordination of care   Naomi MARLA Chad, MD

## 2023-10-02 NOTE — Progress Notes (Signed)
 DISCONTINUE OFF PATHWAY REGIMEN - Breast   [id number]:Ado-Trastuzumab Emtansine  3.6 mg/kg IV D1 q21 Days:   A cycle is every 21 days:     Ado-trastuzumab emtansine    **Always confirm dose/schedule in your pharmacy ordering system**  PRIOR TREATMENT: Off Pathway: Ado-Trastuzumab Emtansine  3.6 mg/kg IV D1 q21 Days  START OFF PATHWAY REGIMEN - Breast   [id number]:Trastuzumab  and hyaluronidase-oysk 600 mg/10,000 units SUBQ D1 q21 Days:   A cycle is every 21 days:     Trastuzumab  and hyaluronidase-oysk   **Always confirm dose/schedule in your pharmacy ordering system**  Patient Characteristics: Distant Metastases or Locoregional Recurrent Disease - Unresected, M0 or Locally Advanced Unresectable Disease Progressing after Neoadjuvant and Local Therapies, M0, HER2 Positive, ER Negative, Chemotherapy, Third Line Therapeutic Status: Distant Metastases HER2 Status: Positive (+) ER Status: Negative (-) PR Status: Negative (-) Line of Therapy: Third Line Intent of Therapy: Non-Curative / Palliative Intent, Discussed with Patient

## 2023-10-02 NOTE — Assessment & Plan Note (Addendum)
 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 palpable right breast mass measuring about 7 cm x 5 and half centimeters, palpable right axillary lymph node with concern for skin invasion    Treatment Plan: 1. Neoadjuvant chemo with TCHP q 3 weeks X 6 followed by HP completed 07/07/2021 2. Bilateral mastectomies: Left mastectomy: Benign Right mastectomy: Residual microinvasive cancer status post neoadjuvant therapy, DCIS, 0/12 lymph nodes, ER +1%, PR negative, HER2 equivocal 2+ by IHC 3. Adj XRT completed 02/23/2021 4. Adj Anti-estrogen therapy with tamoxifen  started 06/08/2021 discontinued 06/24/2021 (ER was only 1% therefore we felt risks outweigh benefits) 5. Neratinib: Because of her bowel issues she decided not to take it. 6. Right chest palpable nodule: 1.2 cm mass at 10 o'clock position biopsy revealed grade 2 IDC with high-grade DCIS ER 20% positive weak staining, PR 0%, HER2 3+ positive, Ki-67 50% 7. 05/02/2022: Right lumpectomy: Recurrent grade 3 IDC, posterior margin focally involved by cancer April 2024: Skin right breast excision: Benign 8.  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  Scans in 3 months to determine if more intense treatment would be required. Hospitalization: 09/02/2023-09/08/2023: Pneumonia, hypotension

## 2023-10-02 NOTE — Progress Notes (Signed)
 Per MD request, orders placed for Echo prior to upcoming herceptin  tx.

## 2023-10-08 ENCOUNTER — Ambulatory Visit (HOSPITAL_COMMUNITY)
Admission: RE | Admit: 2023-10-08 | Discharge: 2023-10-08 | Disposition: A | Discharge status: Home / Self Care | Source: Ambulatory Visit | Attending: Hematology and Oncology | Admitting: Hematology and Oncology

## 2023-10-08 ENCOUNTER — Encounter: Payer: Self-pay | Admitting: Hematology and Oncology

## 2023-10-08 ENCOUNTER — Telehealth: Payer: Self-pay

## 2023-10-08 ENCOUNTER — Other Ambulatory Visit: Payer: Self-pay

## 2023-10-08 DIAGNOSIS — Z01818 Encounter for other preprocedural examination: Secondary | ICD-10-CM | POA: Insufficient documentation

## 2023-10-08 DIAGNOSIS — Z79899 Other long term (current) drug therapy: Secondary | ICD-10-CM | POA: Diagnosis not present

## 2023-10-08 DIAGNOSIS — Z170 Estrogen receptor positive status [ER+]: Secondary | ICD-10-CM

## 2023-10-08 DIAGNOSIS — Z5181 Encounter for therapeutic drug level monitoring: Secondary | ICD-10-CM | POA: Diagnosis not present

## 2023-10-08 LAB — ECHOCARDIOGRAM COMPLETE: S' Lateral: 2.6 cm

## 2023-10-08 NOTE — Telephone Encounter (Signed)
 S/w pt to let her know we are waiting for auth on her tx and have sent further clinical info to insurance. She knows I will call later if we are unable to obtain auth, to r/s her. She verbalized concern for her red lumen on her PICC line, stating she was not able to infuse her TPN last night in that lumen; however, home health came yesterday for her dsg/cap change and they were able to flush. Advised pt we would assess at her appt and administer cathflo if needed. She verbalized thanks and understanding.

## 2023-10-08 NOTE — Telephone Encounter (Signed)
-----   Message from Holley H sent at 10/08/2023  7:56 AM EDT ----- Regarding: RE: new start Good morning - case is still pending review. Per Ms. Joshua with [facility], MD is requesting more clinical. I am re-faxing all of that now. I asked them to review as urgent, since she is scheduled for tomorrow.  I will continue to check on the status and let you know of any updates.  Thanks,  Brandi ----- Message ----- From: Delores Leita MATSU, LPN Sent: 3/73/7974   2:57 PM EDT To: Rudean LITTIE Franks, LPN; Thu Ladora Queen, RN; Valer# Subject: RE: new start                                  OK thank you Brandi. She is scheduled for 7/1 so hopefully they will let us  know before Monday.  Thanks Eileen for scheduling her ----- Message ----- From: Chauncey Jarvis Sent: 10/04/2023  12:50 PM EDT To: Rudean LITTIE Franks, LPN; Thu Ladora Queen, RN; Valer# Subject: RE: new start                                  Good afternoon - the shara is still pending review.  Thanks,  Brandi ----- Message ----- From: Delores Leita MATSU, LPN Sent: 3/73/7974  12:40 PM EDT To: Rudean LITTIE Franks, LPN; Thu Ladora Queen, RN; Valer# Subject: RE: new start                                  Walterine Jarvis,  Just wanted to follow up on this request. ----- Message ----- From: Wallene Larraine LITTIE, RN Sent: 10/02/2023   2:52 PM EDT To: Rudean LITTIE Franks, LPN; Thu Ladora Queen, RN; Valer# Subject: new start                                      Walterine Al Jarvis- can you please work on PA for her Ulanda?   Nanetta.  This pt needs lab and infusion next week for Kanjanti.  She does not need to see [name] next week but he does need to see her with the following tx if you can please get that scheduled and call her to confirm.  Thanks!

## 2023-10-09 ENCOUNTER — Telehealth: Payer: Self-pay | Admitting: *Deleted

## 2023-10-09 ENCOUNTER — Inpatient Hospital Stay

## 2023-10-09 ENCOUNTER — Inpatient Hospital Stay: Attending: Hematology and Oncology

## 2023-10-09 ENCOUNTER — Encounter: Payer: Self-pay | Admitting: *Deleted

## 2023-10-09 VITALS — BP 108/81 | HR 94 | Temp 98.3°F | Resp 16 | Wt 126.5 lb

## 2023-10-09 DIAGNOSIS — C50411 Malignant neoplasm of upper-outer quadrant of right female breast: Secondary | ICD-10-CM | POA: Insufficient documentation

## 2023-10-09 DIAGNOSIS — Z452 Encounter for adjustment and management of vascular access device: Secondary | ICD-10-CM | POA: Diagnosis not present

## 2023-10-09 DIAGNOSIS — Z5112 Encounter for antineoplastic immunotherapy: Secondary | ICD-10-CM | POA: Insufficient documentation

## 2023-10-09 DIAGNOSIS — Z95828 Presence of other vascular implants and grafts: Secondary | ICD-10-CM

## 2023-10-09 LAB — CMP (CANCER CENTER ONLY)
ALT: 82 U/L — ABNORMAL HIGH (ref 0–44)
AST: 115 U/L — ABNORMAL HIGH (ref 15–41)
Albumin: 3.6 g/dL (ref 3.5–5.0)
Alkaline Phosphatase: 285 U/L — ABNORMAL HIGH (ref 38–126)
Anion gap: 8 (ref 5–15)
BUN: 29 mg/dL — ABNORMAL HIGH (ref 6–20)
CO2: 26 mmol/L (ref 22–32)
Calcium: 9.2 mg/dL (ref 8.9–10.3)
Chloride: 102 mmol/L (ref 98–111)
Creatinine: 0.9 mg/dL (ref 0.44–1.00)
GFR, Estimated: >60 mL/min (ref >60)
Glucose, Bld: 105 mg/dL — ABNORMAL HIGH (ref 70–99)
Potassium: 3.9 mmol/L (ref 3.5–5.1)
Sodium: 136 mmol/L (ref 135–145)
Total Bilirubin: 0.6 mg/dL (ref 0.0–1.2)
Total Protein: 8.1 g/dL (ref 6.5–8.1)

## 2023-10-09 LAB — CBC WITH DIFFERENTIAL (CANCER CENTER ONLY)
Abs Immature Granulocytes: 0.01 10*3/uL (ref 0.00–0.07)
Basophils Absolute: 0 10*3/uL (ref 0.0–0.1)
Basophils Relative: 1 %
Eosinophils Absolute: 0.1 10*3/uL (ref 0.0–0.5)
Eosinophils Relative: 1 %
HCT: 27.4 % — ABNORMAL LOW (ref 36.0–46.0)
Hemoglobin: 8.8 g/dL — ABNORMAL LOW (ref 12.0–15.0)
Immature Granulocytes: 0 %
Lymphocytes Relative: 24 %
Lymphs Abs: 1.7 10*3/uL (ref 0.7–4.0)
MCH: 24.6 pg — ABNORMAL LOW (ref 26.0–34.0)
MCHC: 32.1 g/dL (ref 30.0–36.0)
MCV: 76.8 fL — ABNORMAL LOW (ref 80.0–100.0)
Monocytes Absolute: 0.7 10*3/uL (ref 0.1–1.0)
Monocytes Relative: 10 %
Neutro Abs: 4.3 10*3/uL (ref 1.7–7.7)
Neutrophils Relative %: 64 %
Platelet Count: 266 10*3/uL (ref 150–400)
RBC: 3.57 MIL/uL — ABNORMAL LOW (ref 3.87–5.11)
RDW: 13.8 % (ref 11.5–15.5)
WBC Count: 6.8 10*3/uL (ref 4.0–10.5)
nRBC: 0 % (ref 0.0–0.2)

## 2023-10-09 MED ORDER — TRASTUZUMAB-ANNS CHEMO 150 MG IV SOLR
8.0000 mg/kg | Freq: Once | INTRAVENOUS | Status: AC
  Administered 2023-10-09: 420 mg via INTRAVENOUS
  Filled 2023-10-09: qty 20

## 2023-10-09 MED ORDER — SODIUM CHLORIDE 0.9% FLUSH
10.0000 mL | INTRAVENOUS | Status: DC | PRN
Start: 2023-10-09 — End: 2023-10-09
  Administered 2023-10-09: 10 mL

## 2023-10-09 MED ORDER — ALTEPLASE 2 MG IJ SOLR
2.0000 mg | Freq: Once | INTRAMUSCULAR | Status: AC
  Administered 2023-10-09: 2 mg
  Filled 2023-10-09: qty 2

## 2023-10-09 MED ORDER — ACETAMINOPHEN 325 MG PO TABS
650.0000 mg | ORAL_TABLET | Freq: Once | ORAL | Status: AC
  Administered 2023-10-09: 650 mg via ORAL
  Filled 2023-10-09: qty 2

## 2023-10-09 MED ORDER — SODIUM CHLORIDE 0.9% FLUSH
10.0000 mL | Freq: Once | INTRAVENOUS | Status: AC
  Administered 2023-10-09: 10 mL

## 2023-10-09 MED ORDER — DIPHENHYDRAMINE HCL 25 MG PO CAPS
25.0000 mg | ORAL_CAPSULE | Freq: Once | ORAL | Status: AC
  Administered 2023-10-09: 25 mg via ORAL
  Filled 2023-10-09: qty 1

## 2023-10-09 MED ORDER — HEPARIN SOD (PORK) LOCK FLUSH 100 UNIT/ML IV SOLN
500.0000 [IU] | Freq: Once | INTRAVENOUS | Status: AC | PRN
  Administered 2023-10-09: 500 [IU]

## 2023-10-09 MED ORDER — SODIUM CHLORIDE 0.9 % IV SOLN: INTRAVENOUS | Status: DC

## 2023-10-09 NOTE — Patient Instructions (Signed)
 [facility] - A DEPT OF [facility]  Discharge Instructions: Thank you for choosing [facility] to provide your oncology and hematology care.   If you have a lab appointment with the Cancer Center, please go directly to the Cancer Center and check in at the registration area.   Wear comfortable clothing and clothing appropriate for easy access to any Portacath or PICC line.   We strive to give you quality time with your provider. You may need to reschedule your appointment if you arrive late (15 or more minutes).  Arriving late affects you and other patients whose appointments are after yours.  Also, if you miss three or more appointments without notifying the office, you may be dismissed from the clinic at the provider's discretion.      For prescription refill requests, have your pharmacy contact our office and allow 72 hours for refills to be completed.    Today you received the following chemotherapy and/or immunotherapy agents: Kanjinti  (Trastuzumab )      To help prevent nausea and vomiting after your treatment, we encourage you to take your nausea medication as directed.  BELOW ARE SYMPTOMS THAT SHOULD BE REPORTED IMMEDIATELY: *FEVER GREATER THAN 100.4 F (38 C) OR HIGHER *CHILLS OR SWEATING *NAUSEA AND VOMITING THAT IS NOT CONTROLLED WITH YOUR NAUSEA MEDICATION *UNUSUAL SHORTNESS OF BREATH *UNUSUAL BRUISING OR BLEEDING *URINARY PROBLEMS (pain or burning when urinating, or frequent urination) *BOWEL PROBLEMS (unusual diarrhea, constipation, pain near the anus) TENDERNESS IN MOUTH AND THROAT WITH OR WITHOUT PRESENCE OF ULCERS (sore throat, sores in mouth, or a toothache) UNUSUAL RASH, SWELLING OR PAIN  UNUSUAL VAGINAL DISCHARGE OR ITCHING   Items with * indicate a potential emergency and should be followed up as soon as possible or go to the Emergency Department if any problems should occur.  Please show the CHEMOTHERAPY ALERT CARD or  IMMUNOTHERAPY ALERT CARD at check-in to the Emergency Department and triage nurse.  Should you have questions after your visit or need to cancel or reschedule your appointment, please contact [facility] - A DEPT OF [facility]  Dept: [phone]  and follow the prompts.  Office hours are 8:00 a.m. to 4:30 p.m. Monday - Friday. Please note that voicemails left after 4:00 p.m. may not be returned until the following business day.  We are closed weekends and major holidays. You have access to a nurse at all times for urgent questions. Please call the main number to the clinic Dept: [phone] and follow the prompts.   For any non-urgent questions, you may also contact your provider using MyChart. We now offer e-Visits for anyone 67 and older to request care online for non-urgent symptoms. For details visit [email or web address].   Also download the MyChart app! Go to the app store, search MyChart, open the app, select [facility], and log in with your MyChart username and password.  Trastuzumab  Injection What is this medication? TRASTUZUMAB  (tras TOO zoo mab) treats breast cancer and stomach cancer. It works by blocking a protein that causes cancer cells to grow and multiply. This helps to slow or stop the spread of cancer cells. This medicine may be used for other purposes; ask your health care provider or pharmacist if you have questions. COMMON BRAND NAME(S): Herceptin , HERCESSI, Herzuma , KANJINTI , Ogivri , Ontruzant , Trazimera  What should I tell my care team before I take this medication? They need to know if you have any  of these conditions: Heart failure Lung disease An unusual or allergic reaction to trastuzumab , other medications, foods, dyes, or preservatives Pregnant or trying to get pregnant Breast-feeding How should I use this medication? This medication is injected into a vein. It is given by your care team in a hospital or clinic setting. Talk  to your care team about the use of this medication in children. It is not approved for use in children. Overdosage: If you think you have taken too much of this medicine contact a poison control center or emergency room at once. NOTE: This medicine is only for you. Do not share this medicine with others. What if I miss a dose? Keep appointments for follow-up doses. It is important not to miss your dose. Call your care team if you are unable to keep an appointment. What may interact with this medication? Certain types of chemotherapy, such as daunorubicin, doxorubicin, epirubicin, idarubicin This list may not describe all possible interactions. Give your health care provider a list of all the medicines, herbs, non-prescription drugs, or dietary supplements you use. Also tell them if you smoke, drink alcohol, or use illegal drugs. Some items may interact with your medicine. What should I watch for while using this medication? Your condition will be monitored carefully while you are receiving this medication. This medication may make you feel generally unwell. This is not uncommon, as chemotherapy affects healthy cells as well as cancer cells. Report any side effects. Continue your course of treatment even though you feel ill unless your care team tells you to stop. This medication may increase your risk of getting an infection. Call your care team for advice if you get a fever, chills, sore throat, or other symptoms of a cold or flu. Do not treat yourself. Try to avoid being around people who are sick. Avoid taking medications that contain aspirin, acetaminophen , ibuprofen, naproxen , or ketoprofen unless instructed by your care team. These medications can hide a fever. Talk to your care team if you may be pregnant. Serious birth defects can occur if you take this medication during pregnancy and for 7 months after the last dose. You will need a negative pregnancy test before starting this medication.  Contraception is recommended while taking this medication and for 7 months after the last dose. Your care team can help you find the option that works for you. Do not breastfeed while taking this medication and for 7 months after stopping treatment. What side effects may I notice from receiving this medication? Side effects that you should report to your care team as soon as possible: Allergic reactions or angioedema--skin rash, itching or hives, swelling of the face, eyes, lips, tongue, arms, or legs, trouble swallowing or breathing Dry cough, shortness of breath or trouble breathing Heart failure--shortness of breath, swelling of the ankles, feet, or hands, sudden weight gain, unusual weakness or fatigue Infection--fever, chills, cough, or sore throat Infusion reactions--chest pain, shortness of breath or trouble breathing, feeling faint or lightheaded Side effects that usually do not require medical attention (report to your care team if they continue or are bothersome): Diarrhea Dizziness Headache Nausea Trouble sleeping Vomiting This list may not describe all possible side effects. Call your doctor for medical advice about side effects. You may report side effects to FDA at 1-800-FDA-1088. Where should I keep my medication? This medication is given in a hospital or clinic. It will not be stored at home. NOTE: This sheet is a summary. It may not cover all possible  information. If you have questions about this medicine, talk to your doctor, pharmacist, or health care provider.  2024 Elsevier/Gold Standard (2021-08-09 00:00:00)

## 2023-10-09 NOTE — Telephone Encounter (Signed)
 Patient was here for flush and infusion appointment.  Had previously mentioned on phone call from the day prior that red lumen on her PICC line would not infuse her TPN,  however, home health nurse did a dressing/cap change and they were able to flush.   Here today for flush and infusion, unable to get blood return.  Flush room administered cathflo. Pending effectiveness.  On schedule today to get Kanjinti  today.    Per Infusion was able to get blood return from purple lumen only.  Per Dr Odean ok to proceed with that lumen and with giving loading dose of Kanjinti  over 30 minutes.

## 2023-10-09 NOTE — Progress Notes (Signed)
 It was confirmed that it is ok to use the functional lumen when the other clogged lumen has cathflo dwelling.

## 2023-10-09 NOTE — Progress Notes (Signed)
 OK to reload Trastuzumab  per [name].  Soleil Mas, Pharm.D., CPP 10/09/2023@3 :47 PM

## 2023-10-11 ENCOUNTER — Encounter: Payer: Self-pay | Admitting: Hematology and Oncology

## 2023-10-23 ENCOUNTER — Encounter: Payer: Self-pay | Admitting: Hematology and Oncology

## 2023-10-29 ENCOUNTER — Encounter: Payer: Self-pay | Admitting: Adult Health

## 2023-10-30 ENCOUNTER — Inpatient Hospital Stay: Admitting: Hematology and Oncology

## 2023-10-30 ENCOUNTER — Inpatient Hospital Stay

## 2023-10-30 ENCOUNTER — Other Ambulatory Visit: Payer: Self-pay | Admitting: *Deleted

## 2023-10-30 DIAGNOSIS — C50411 Malignant neoplasm of upper-outer quadrant of right female breast: Secondary | ICD-10-CM

## 2023-10-30 NOTE — Progress Notes (Signed)
 Received call from pt with complaint of decrease in appetite.  Pt states she was eating one meal a day while on TPN but is now not eating at all during the day and solely utilizing TPN.  Referral placed to [facility] nutrition team and MD notified to discuss with pt during tx this week. RN attempt x1 to return call, no answer. LVM for pt to return call to the office.

## 2023-10-31 ENCOUNTER — Observation Stay (HOSPITAL_COMMUNITY)
Admission: EM | Admit: 2023-10-31 | Discharge: 2023-11-02 | Disposition: A | Discharge status: Home / Self Care | Attending: Internal Medicine | Admitting: Internal Medicine

## 2023-10-31 ENCOUNTER — Other Ambulatory Visit: Payer: Self-pay | Admitting: *Deleted

## 2023-10-31 ENCOUNTER — Other Ambulatory Visit: Payer: Self-pay

## 2023-10-31 ENCOUNTER — Encounter (HOSPITAL_COMMUNITY): Payer: Self-pay

## 2023-10-31 DIAGNOSIS — E872 Acidosis, unspecified: Secondary | ICD-10-CM | POA: Diagnosis not present

## 2023-10-31 DIAGNOSIS — C50411 Malignant neoplasm of upper-outer quadrant of right female breast: Secondary | ICD-10-CM

## 2023-10-31 DIAGNOSIS — D649 Anemia, unspecified: Secondary | ICD-10-CM | POA: Insufficient documentation

## 2023-10-31 DIAGNOSIS — R6510 Systemic inflammatory response syndrome (SIRS) of non-infectious origin without acute organ dysfunction: Secondary | ICD-10-CM | POA: Insufficient documentation

## 2023-10-31 DIAGNOSIS — Z79899 Other long term (current) drug therapy: Secondary | ICD-10-CM | POA: Diagnosis not present

## 2023-10-31 DIAGNOSIS — C787 Secondary malignant neoplasm of liver and intrahepatic bile duct: Secondary | ICD-10-CM | POA: Diagnosis not present

## 2023-10-31 DIAGNOSIS — Z170 Estrogen receptor positive status [ER+]: Secondary | ICD-10-CM

## 2023-10-31 DIAGNOSIS — R109 Unspecified abdominal pain: Secondary | ICD-10-CM | POA: Diagnosis present

## 2023-10-31 DIAGNOSIS — C50911 Malignant neoplasm of unspecified site of right female breast: Secondary | ICD-10-CM | POA: Diagnosis not present

## 2023-10-31 DIAGNOSIS — E440 Moderate protein-calorie malnutrition: Secondary | ICD-10-CM | POA: Insufficient documentation

## 2023-10-31 DIAGNOSIS — R7989 Other specified abnormal findings of blood chemistry: Secondary | ICD-10-CM | POA: Insufficient documentation

## 2023-10-31 DIAGNOSIS — C799 Secondary malignant neoplasm of unspecified site: Principal | ICD-10-CM

## 2023-10-31 DIAGNOSIS — A4150 Gram-negative sepsis, unspecified: Secondary | ICD-10-CM | POA: Insufficient documentation

## 2023-10-31 DIAGNOSIS — Z7189 Other specified counseling: Secondary | ICD-10-CM

## 2023-10-31 LAB — URINALYSIS, W/ REFLEX TO CULTURE (INFECTION SUSPECTED)
Bacteria, UA: NONE SEEN
Glucose, UA: NEGATIVE mg/dL
Hgb urine dipstick: NEGATIVE
Ketones, ur: NEGATIVE mg/dL
Leukocytes,Ua: NEGATIVE
Nitrite: NEGATIVE
Protein, ur: NEGATIVE mg/dL
Specific Gravity, Urine: 1.023 (ref 1.005–1.030)
pH: 5 (ref 5.0–8.0)

## 2023-10-31 LAB — CBC WITH DIFFERENTIAL/PLATELET
Abs Immature Granulocytes: 0.16 K/uL — ABNORMAL HIGH (ref 0.00–0.07)
Basophils Absolute: 0 K/uL (ref 0.0–0.1)
Basophils Relative: 0 %
Eosinophils Absolute: 0 K/uL (ref 0.0–0.5)
Eosinophils Relative: 0 %
HCT: 23.4 % — ABNORMAL LOW (ref 36.0–46.0)
Hemoglobin: 7.3 g/dL — ABNORMAL LOW (ref 12.0–15.0)
Immature Granulocytes: 1 %
Lymphocytes Relative: 9 %
Lymphs Abs: 1.7 K/uL (ref 0.7–4.0)
MCH: 22.9 pg — ABNORMAL LOW (ref 26.0–34.0)
MCHC: 31.2 g/dL (ref 30.0–36.0)
MCV: 73.4 fL — ABNORMAL LOW (ref 80.0–100.0)
Monocytes Absolute: 1.8 K/uL — ABNORMAL HIGH (ref 0.1–1.0)
Monocytes Relative: 10 %
Neutro Abs: 14.6 K/uL — ABNORMAL HIGH (ref 1.7–7.7)
Neutrophils Relative %: 80 %
Platelets: 386 K/uL (ref 150–400)
RBC: 3.19 MIL/uL — ABNORMAL LOW (ref 3.87–5.11)
RDW: 16.9 % — ABNORMAL HIGH (ref 11.5–15.5)
WBC: 18.3 K/uL — ABNORMAL HIGH (ref 4.0–10.5)
nRBC: 0 % (ref 0.0–0.2)

## 2023-10-31 LAB — COMPREHENSIVE METABOLIC PANEL WITH GFR
ALT: 61 U/L — ABNORMAL HIGH (ref 0–44)
AST: 226 U/L — ABNORMAL HIGH (ref 15–41)
Albumin: 2.2 g/dL — ABNORMAL LOW (ref 3.5–5.0)
Alkaline Phosphatase: 143 U/L — ABNORMAL HIGH (ref 38–126)
Anion gap: 17 — ABNORMAL HIGH (ref 5–15)
BUN: 26 mg/dL — ABNORMAL HIGH (ref 6–20)
CO2: 19 mmol/L — ABNORMAL LOW (ref 22–32)
Calcium: 8.7 mg/dL — ABNORMAL LOW (ref 8.9–10.3)
Chloride: 99 mmol/L (ref 98–111)
Creatinine, Ser: 0.87 mg/dL (ref 0.44–1.00)
GFR, Estimated: >60 mL/min (ref >60)
Glucose, Bld: 86 mg/dL (ref 70–99)
Potassium: 4.2 mmol/L (ref 3.5–5.1)
Sodium: 135 mmol/L (ref 135–145)
Total Bilirubin: 6.6 mg/dL — ABNORMAL HIGH (ref 0.0–1.2)
Total Protein: 7.9 g/dL (ref 6.5–8.1)

## 2023-10-31 MED ORDER — PIPERACILLIN-TAZOBACTAM 3.375 G IVPB 30 MIN
3.3750 g | Freq: Once | INTRAVENOUS | Status: AC
  Administered 2023-10-31: 3.375 g via INTRAVENOUS
  Filled 2023-10-31: qty 50

## 2023-10-31 MED ORDER — LACTATED RINGERS IV BOLUS
1000.0000 mL | Freq: Once | INTRAVENOUS | Status: AC
  Administered 2023-10-31: 1000 mL via INTRAVENOUS

## 2023-10-31 NOTE — Progress Notes (Signed)
 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  Also with h/o breast cancer recently diagnosed with mets to liver and received first immunotherapy treatment on 10/09/23.    10/30/23 Routine TPN labs show significant elevated WBC of 18.3 (from 9.3 two weeks ago and 5-6 range before then) with rising liver panel.  Pt reports one week ago, she had no appetite making it difficult to consume anything, describes hot flashes at night and increased nocturia.  Endorses RUQ pain.  Denies fever or abnormal BP at nurse visit, but with tachycardia.  Weight relatively stable at 121 lbs.  Advised pt there is concern for infection, and with the risk of CLABSI, that it would be prudent to seek emergency care.  Pt agrees to go to local ED [facility]) today.   Adelina Ruts, PA-C [facility] TPN  [facility] [id number] [facility] [address] [place], [place] [address] phone [phone] fax [phone]

## 2023-10-31 NOTE — ED Triage Notes (Signed)
 Pt is using TPN for nutrition and has been unable to eat and hydrate as normal. Pt is having RUQ pain and was told that her white count was elevated.

## 2023-10-31 NOTE — Assessment & Plan Note (Deleted)
 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 palpable right breast mass measuring about 7 cm x 5 and half centimeters, palpable right axillary lymph node with concern for skin invasion    Treatment Plan: 1. Neoadjuvant chemo with TCHP q 3 weeks X 6 followed by HP completed 07/07/2021 2. Bilateral mastectomies: Left mastectomy: Benign Right mastectomy: Residual microinvasive cancer status post neoadjuvant therapy, DCIS, 0/12 lymph nodes, ER +1%, PR negative, HER2 equivocal 2+ by IHC 3. Adj XRT completed 02/23/2021 4. Adj Anti-estrogen therapy with tamoxifen  started 06/08/2021 discontinued 06/24/2021 (ER was only 1% therefore we felt risks outweigh benefits) 5. Neratinib: Because of her bowel issues she decided not to take it. 6. Right chest palpable nodule: 1.2 cm mass at 10 o'clock position biopsy revealed grade 2 IDC with high-grade DCIS ER 20% positive weak staining, PR 0%, HER2 3+ positive, Ki-67 50% 7. 05/02/2022: Right lumpectomy: Recurrent grade 3 IDC, posterior margin focally involved by cancer April 2024: Skin right breast excision: Benign 8.  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  Scans in 3 months to determine if more intense treatment would be required. Hospitalization: 09/02/2023-09/08/2023: Pneumonia, hypotension

## 2023-10-31 NOTE — ED Notes (Signed)
 Writer notified Dr. Patsey of pt's heart rate being elevated

## 2023-10-31 NOTE — ED Provider Notes (Signed)
 [facility]PART[facility] AT [facility] Provider Note   CSN: [id number] Arrival date & time: 10/31/23  1710     Patient presents with: Abdominal Pain   [name] is a 48 y.o. female.  {Add pertinent medical, surgical, social history, OB history to HPI:32938} HPI    48 year old female with history of Crohn's colitis with obstruction, metastatic breast cancer on herceptin , short bowel syndrome    A few days ago began to have right sided abdominal pain, notice it when walking and when laying on the right side or if take a deep breath.  No shortness of breath No leg swelling No nausea or vomiting Ileostomy, not as much because not eating No known fevers Urinating more throughout the night Night sweats  Appetite changes, thought from liver mets On TPN, saline  TPN pharmacist called and said WBC elevated and need to go to hospital   Past Medical History:  Diagnosis Date   Breast cancer [facility])    right   Colon stricture (HCC) 05/07/2019   Colonic stricture (HCC) 11/19/2022   Crohn's colitis, with intestinal obstruction (HCC) 09/17/2013   Dx 2006 - right and left colon involved 2011 - pan-colitis 09/17/2013 left colitis 60-20 cm March 2021 subtotal colectomy ileosigmoid anastomosis for colonic strictures-Dr. Debby     Crohn's disease [facility])    Exacerbation of Crohn's disease of large intestine (HCC) 12/13/2022   Family history of breast cancer 06/11/2020   History of abdominal subtotal colectomy 11/19/2022   Iron  deficiency anemia secondary to blood loss (chronic) - Crohn's colitis 10/17/2010   Rectal bleeding    Uterine fibroid    Vitamin D  deficiency 09/24/2013    Past Surgical History:  Procedure Laterality Date   AXILLARY LYMPH NODE DISSECTION Right 11/09/2020   Procedure: RIGHT AXILLARY LYMPH NODE DISSECTION;  Surgeon: Ebbie Cough, MD;  Location: [facility];  Service: General;  Laterality: Right;   BIOPSY  07/01/2020    Procedure: BIOPSY;  Surgeon: Leigh Elspeth SQUIBB, MD;  Location: [facility];  Service: Gastroenterology;;   BIOPSY  01/27/2021   Procedure: BIOPSY;  Surgeon: Leigh Elspeth SQUIBB, MD;  Location: [facility];  Service: Gastroenterology;;   BIOPSY  09/07/2021   Procedure: BIOPSY;  Surgeon: Charlanne Groom, MD;  Location: [facility];  Service: Gastroenterology;;   BIOPSY  11/21/2022   Procedure: BIOPSY;  Surgeon: Wilhelmenia Aloha Raddle., MD;  Location: [facility];  Service: Gastroenterology;;   BREAST BIOPSY Right 03/24/2022   US  RT BREAST BX W LOC DEV 1ST LESION IMG BX SPEC US  GUIDE 03/24/2022 [facility]   BREAST BIOPSY  04/28/2022   US  RT RADIOACTIVE SEED LOC 04/28/2022 [facility]   BREAST IMPLANT REMOVAL Right 08/07/2022   Procedure: REMOVAL  OF RIGHT BREAST IMPLANTS;  Surgeon: Elisabeth Craig RAMAN, MD;  Location: [facility];  Service: Plastics;  Laterality: Right;   BREAST RECONSTRUCTION WITH PLACEMENT OF TISSUE EXPANDER AND FLEX HD (ACELLULAR HYDRATED DERMIS) Bilateral 11/09/2020   Procedure: BILATERAL BREAST RECONSTRUCTION WITH PLACEMENT OF TISSUE EXPANDER AND FLEX HD (ACELLULAR HYDRATED DERMIS);  Surgeon: Elisabeth Craig RAMAN, MD;  Location: [facility];  Service: Plastics;  Laterality: Bilateral;   CAPSULOTOMY Bilateral 07/25/2021   Procedure: CAPSULOTOMY;  Surgeon: Elisabeth Craig RAMAN, MD;  Location: [facility];  Service: Plastics;  Laterality: Bilateral;   COLECTOMY  06/27/2019   COLONOSCOPY  2015   ESOPHAGOGASTRODUODENOSCOPY (EGD) WITH PROPOFOL  N/A 11/21/2022   Procedure: ESOPHAGOGASTRODUODENOSCOPY (EGD) WITH PROPOFOL ;  Surgeon: Wilhelmenia,  Aloha Raddle., MD;  Location: [facility];  Service: Gastroenterology;  Laterality: N/A;   FLEXIBLE SIGMOIDOSCOPY N/A 07/01/2020   Procedure: FLEXIBLE SIGMOIDOSCOPY;  Surgeon: Leigh Elspeth SQUIBB, MD;  Location[facility];  Service: Gastroenterology;  Laterality: N/A;   FLEXIBLE SIGMOIDOSCOPY N/A  01/27/2021   Procedure: FLEXIBLE SIGMOIDOSCOPY;  Surgeon: Leigh Elspeth SQUIBB, MD;  Location: [facility];  Service: Gastroenterology;  Laterality: N/A;   FLEXIBLE SIGMOIDOSCOPY N/A 09/07/2021   Procedure: FLEXIBLE SIGMOIDOSCOPY;  Surgeon: Charlanne Groom, MD;  [facility];  Service: Gastroenterology;  Laterality: N/A;   FLEXIBLE SIGMOIDOSCOPY N/A 11/21/2022   Procedure: FLEXIBLE SIGMOIDOSCOPY;  Surgeon: Wilhelmenia Aloha Raddle., MD;  Location: [facility];  Service: Gastroenterology;  Laterality: N/A;   IR CATHETER TUBE CHANGE  03/07/2023   IR FLUORO GUIDE CV LINE RIGHT  01/09/2023   IR PATIENT EVAL TECH 0-60 MINS  04/24/2023   IR RADIOLOGIST EVAL & MGMT  04/13/2023   IR RADIOLOGIST EVAL & MGMT  05/07/2023   IR US  GUIDE VASC ACCESS RIGHT  01/09/2023   LAPAROTOMY N/A 12/14/2022   Procedure: EXPLORATORY LAPAROTOMY WITH SMALL BOWEL RESECTION;  Surgeon: Polly Cordella LABOR, MD;  Location[facility];  Service: General;  Laterality: N/A;   LAPAROTOMY N/A 12/16/2022   Procedure: REOPENING LAPAROTOMY, ILEOSTOMY;  ABDOMINAL CLOSURE;  Surgeon: Polly Cordella LABOR, MD;  Location: [facility];  Service: General;  Laterality: N/A;   NIPPLE SPARING MASTECTOMY Bilateral 11/09/2020   Procedure: BILATERAL NIPPLE SPARING MASTECTOMY;  Surgeon: Ebbie Cough, MD;  Location: [facility];  Service: General;  Laterality: Bilateral;   PORT-A-CATH REMOVAL N/A 07/25/2021   Procedure: REMOVAL PORT-A-CATH;  Surgeon: Elisabeth Craig RAMAN, MD;  Location: [facility];  Service: Plastics;  Laterality: N/A;   PORT-A-CATH REMOVAL Left 01/05/2023   Procedure: REMOVAL PORT-A-CATH;  Surgeon: Vernetta Berg, MD;  Location: [facility];  Service: General;  Laterality: Left;   PORTACATH PLACEMENT N/A 06/23/2020   Procedure: INSERTION PORT-A-CATH;  Surgeon: Ebbie Cough, MD;  Location: [facility];  Service: General;  Laterality: N/A;  START TIME OF 11:00 AM FOR 60 MINUTES [place] [facility]   PORTACATH  PLACEMENT Left 05/02/2022   Procedure: INSERTION PORT-A-CATH;  Surgeon: Ebbie Cough, MD;  Location: [facility];  Service: General;  Laterality: Left;   RADIOACTIVE SEED GUIDED EXCISIONAL BREAST BIOPSY Right 05/02/2022   Procedure: RADIOACTIVE SEED GUIDED RIGHT BREAST MASS EXCISION;  Surgeon: Ebbie Cough, MD;  Location: [facility];  Service: General;  Laterality: Right;   REMOVAL OF BILATERAL TISSUE EXPANDERS WITH PLACEMENT OF BILATERAL BREAST IMPLANTS Bilateral 07/25/2021   Procedure: REMOVAL OF BILATERAL TISSUE EXPANDERS WITH PLACEMENT OF BILATERAL BREAST IMPLANTS;  Surgeon: Elisabeth Craig RAMAN, MD;  Location: [facility];  Service: Plastics;  Laterality: Bilateral;  1.5    Prior to Admission medications   Medication Sig Start Date End Date Taking? Authorizing Provider  acetaminophen  (TYLENOL ) 325 MG tablet Take 2 tablets (650 mg total) by mouth every 6 (six) hours as needed for mild pain (pain score 1-3). 09/08/23   Vann, Jessica U, DO  cholecalciferol  (VITAMIN D3) 25 MCG (1000 UNIT) tablet Take 2,000 Units by mouth daily.    [provider]  fludrocortisone  (FLORINEF ) 0.1 MG tablet Take 0.5 tablets (0.05 mg total) by mouth daily. 09/09/23   Vann, Jessica U, DO  IRON -VITAMIN C  PO Take 1 capsule by mouth daily.    [provider]  levofloxacin  (LEVAQUIN ) 750 MG tablet Take 1 tablet (750 mg total) by  mouth daily. 09/09/23   Vann, Jessica U, DO  loperamide  (IMODIUM ) 2 MG capsule Take 2 mg by mouth 4 (four) times daily as needed for diarrhea or loose stools. 08/29/23 08/28/24  [provider]  midodrine  (PROAMATINE ) 10 MG tablet Take 10 mg by mouth 2 (two) times daily. 08/27/23   [provider]  Multiple Vitamins-Minerals (MULTIVITAMIN WITH MINERALS) tablet Take 1 tablet by mouth daily.    [provider]  sterile water SOLN with amino acids  10 % SOLN 1.3 g/kg, dextrose  70 % SOLN 20 % Inject into the vein See  admin instructions. Infuse TPN 3/1 1500 ml IV through PICC Line Via ambulatory infusion pump over 16 Hour with 1 Hour Ramp Up & 1 Hour Ramp Down 3 X Week (MWF). Add 10 ml MVI Daily Prior To Infusion. Bag Volume: 1550 ml  Base Formula           Amount/Day         Amount/L  Dextrose  70%          175.00 Gm           116.67 Gm Plenamine 15%        80.00 Gm             53.33 Gm SMOFlipid 20%        40.00 Gm             26.67 Gm  Electrolytes  Calcium  Glucona     6.00 meq              4.00 meq Magnesium  Sulf      8.00 meq               5.33 meq Sodium Chloride      230 meq                153.33 meq Potassium Phos     15 mM                    10.00 mM K Acetate                67.00 meq              44.67 meq  Vitamins, trace elements and medications Tralement               1.00 mL                   0.67 mL     14 hours a day    [provider]    Allergies: Nsaids, Chlorhexidine  gluconate, Hydromorphone , Lactose, Lactose intolerance (gi), and Vancomycin     Review of Systems  Updated Vital Signs BP 111/76   Pulse (!) 115   Temp 98.3 F (36.8 C) (Oral)   Resp 16   LMP 04/24/2021   SpO2 100%   Physical Exam  (all labs ordered are listed, but only abnormal results are displayed) Labs Reviewed  CBC WITH DIFFERENTIAL/PLATELET - Abnormal; Notable for the following components:      Result Value   WBC 18.3 (*)    RBC 3.19 (*)    Hemoglobin 7.3 (*)    HCT 23.4 (*)    MCV 73.4 (*)    MCH 22.9 (*)    RDW 16.9 (*)    Neutro Abs 14.6 (*)    Monocytes Absolute 1.8 (*)    Abs Immature Granulocytes 0.16 (*)    All other components within normal limits  COMPREHENSIVE METABOLIC PANEL WITH GFR - Abnormal; Notable for the following components:   CO2 19 (*)    BUN 26 (*)    Calcium  8.7 (*)    Albumin  2.2 (*)    AST 226 (*)    ALT 61 (*)    Alkaline Phosphatase 143 (*)    Total Bilirubin 6.6 (*)    Anion gap 17 (*)    All other components within normal limits     EKG: None  Radiology: No results found.  {Document cardiac monitor, telemetry assessment procedure when appropriate:32947} Procedures   Medications Ordered in the ED - No data to display    {Click here for ABCD2, HEART and other calculators REFRESH Note before signing:1}                              Medical Decision Making Amount and/or Complexity of Data Reviewed Labs: ordered.   ***  {Document critical care time when appropriate  Document review of labs and clinical decision tools ie CHADS2VASC2, etc  Document your independent review of radiology images and any outside records  Document your discussion with family members, caretakers and with consultants  Document social determinants of health affecting pt's care  Document your decision making why or why not admission, treatments were needed:32947:::1}   Final diagnoses:  None    ED Discharge Orders     None

## 2023-10-31 NOTE — Progress Notes (Signed)
 ED Pharmacy Antibiotic Sign Off An antibiotic consult was received from an ED provider for Zosyn  per pharmacy dosing for intra-abdominal infection. A chart review was completed to assess appropriateness.   The following one time order(s) were placed:  Zosyn  3.375 g  Further antibiotic and/or antibiotic pharmacy consults should be ordered by the admitting provider if indicated.   Thank you for allowing pharmacy to be a part of this patient's care.   Stefano MARLA Bologna, PharmD, BCPS Clinical Pharmacist 10/31/2023 8:58 PM

## 2023-11-01 ENCOUNTER — Inpatient Hospital Stay

## 2023-11-01 ENCOUNTER — Emergency Department (HOSPITAL_COMMUNITY)

## 2023-11-01 ENCOUNTER — Inpatient Hospital Stay: Admitting: Hematology and Oncology

## 2023-11-01 ENCOUNTER — Other Ambulatory Visit: Payer: Self-pay | Admitting: Hematology and Oncology

## 2023-11-01 ENCOUNTER — Inpatient Hospital Stay (HOSPITAL_COMMUNITY)

## 2023-11-01 DIAGNOSIS — Z170 Estrogen receptor positive status [ER+]: Secondary | ICD-10-CM

## 2023-11-01 DIAGNOSIS — D649 Anemia, unspecified: Secondary | ICD-10-CM | POA: Insufficient documentation

## 2023-11-01 DIAGNOSIS — A4150 Gram-negative sepsis, unspecified: Secondary | ICD-10-CM | POA: Insufficient documentation

## 2023-11-01 DIAGNOSIS — C50911 Malignant neoplasm of unspecified site of right female breast: Secondary | ICD-10-CM | POA: Diagnosis present

## 2023-11-01 DIAGNOSIS — C787 Secondary malignant neoplasm of liver and intrahepatic bile duct: Secondary | ICD-10-CM

## 2023-11-01 DIAGNOSIS — Z7189 Other specified counseling: Secondary | ICD-10-CM

## 2023-11-01 DIAGNOSIS — E872 Acidosis, unspecified: Secondary | ICD-10-CM | POA: Insufficient documentation

## 2023-11-01 DIAGNOSIS — R7989 Other specified abnormal findings of blood chemistry: Secondary | ICD-10-CM | POA: Insufficient documentation

## 2023-11-01 DIAGNOSIS — C50411 Malignant neoplasm of upper-outer quadrant of right female breast: Secondary | ICD-10-CM

## 2023-11-01 LAB — RETICULOCYTES
Immature Retic Fract: 28.5 % — ABNORMAL HIGH (ref 2.3–15.9)
RBC.: 2.7 MIL/uL — ABNORMAL LOW (ref 3.87–5.11)
Retic Count, Absolute: 72.9 K/uL (ref 19.0–186.0)
Retic Ct Pct: 2.7 % (ref 0.4–3.1)

## 2023-11-01 LAB — BLOOD CULTURE ID PANEL (REFLEXED) - BCID2

## 2023-11-01 LAB — CBC
HCT: 20.3 % — ABNORMAL LOW (ref 36.0–46.0)
Hemoglobin: 6.3 g/dL — CL (ref 12.0–15.0)
MCH: 22.7 pg — ABNORMAL LOW (ref 26.0–34.0)
MCHC: 31 g/dL (ref 30.0–36.0)
MCV: 73.3 fL — ABNORMAL LOW (ref 80.0–100.0)
Platelets: 375 K/uL (ref 150–400)
RBC: 2.77 MIL/uL — ABNORMAL LOW (ref 3.87–5.11)
RDW: 17.2 % — ABNORMAL HIGH (ref 11.5–15.5)
WBC: 17.6 K/uL — ABNORMAL HIGH (ref 4.0–10.5)
nRBC: 0 % (ref 0.0–0.2)

## 2023-11-01 LAB — PROCALCITONIN: Procalcitonin: 9.98 ng/mL

## 2023-11-01 LAB — BILIRUBIN, DIRECT: Bilirubin, Direct: 3.4 mg/dL — ABNORMAL HIGH (ref 0.0–0.2)

## 2023-11-01 LAB — COMPREHENSIVE METABOLIC PANEL WITH GFR
ALT: 52 U/L — ABNORMAL HIGH (ref 0–44)
AST: 191 U/L — ABNORMAL HIGH (ref 15–41)
Albumin: 1.7 g/dL — ABNORMAL LOW (ref 3.5–5.0)
Alkaline Phosphatase: 124 U/L (ref 38–126)
Anion gap: 10 (ref 5–15)
BUN: 19 mg/dL (ref 6–20)
CO2: 22 mmol/L (ref 22–32)
Calcium: 8 mg/dL — ABNORMAL LOW (ref 8.9–10.3)
Chloride: 99 mmol/L (ref 98–111)
Creatinine, Ser: 0.71 mg/dL (ref 0.44–1.00)
GFR, Estimated: >60 mL/min (ref >60)
Glucose, Bld: 53 mg/dL — ABNORMAL LOW (ref 70–99)
Potassium: 4.1 mmol/L (ref 3.5–5.1)
Sodium: 131 mmol/L — ABNORMAL LOW (ref 135–145)
Total Bilirubin: 6.6 mg/dL — ABNORMAL HIGH (ref 0.0–1.2)
Total Protein: 6.8 g/dL (ref 6.5–8.1)

## 2023-11-01 LAB — IRON AND TIBC
Iron: 12 ug/dL — ABNORMAL LOW (ref 28–170)
Saturation Ratios: 9 % — ABNORMAL LOW (ref 10.4–31.8)
TIBC: 141 ug/dL — ABNORMAL LOW (ref 250–450)
UIBC: 129 ug/dL

## 2023-11-01 LAB — LACTIC ACID, PLASMA: Lactic Acid, Venous: 2.6 mmol/L (ref 0.5–1.9)

## 2023-11-01 LAB — FOLATE: Folate: 17.1 ng/mL (ref >5.9)

## 2023-11-01 LAB — I-STAT CG4 LACTIC ACID, ED
Lactic Acid, Venous: 2.9 mmol/L (ref 0.5–1.9)
Lactic Acid, Venous: 2.8 mmol/L (ref 0.5–1.9)

## 2023-11-01 LAB — HIV ANTIBODY (ROUTINE TESTING W REFLEX): HIV Screen 4th Generation wRfx: NONREACTIVE

## 2023-11-01 LAB — FERRITIN: Ferritin: 1235 ng/mL — ABNORMAL HIGH (ref 11–307)

## 2023-11-01 LAB — PREPARE RBC (CROSSMATCH)

## 2023-11-01 LAB — PHOSPHORUS: Phosphorus: 4.1 mg/dL (ref 2.5–4.6)

## 2023-11-01 LAB — MAGNESIUM: Magnesium: 2 mg/dL (ref 1.7–2.4)

## 2023-11-01 LAB — VITAMIN B12: Vitamin B-12: 661 pg/mL (ref 180–914)

## 2023-11-01 MED ORDER — SODIUM CHLORIDE 0.9 % IV SOLN
2.0000 g | INTRAVENOUS | Status: DC
  Administered 2023-11-01 – 2023-11-02 (×2): 2 g via INTRAVENOUS
  Filled 2023-11-01 (×2): qty 20

## 2023-11-01 MED ORDER — VITAMIN D 25 MCG (1000 UNIT) PO TABS
2000.0000 [IU] | ORAL_TABLET | Freq: Every day | ORAL | Status: DC
  Filled 2023-11-01: qty 2

## 2023-11-01 MED ORDER — MORPHINE SULFATE (PF) 2 MG/ML IV SOLN: 2.0000 mg | INTRAVENOUS | Status: DC | PRN

## 2023-11-01 MED ORDER — ACETAMINOPHEN 650 MG RE SUPP: 650.0000 mg | Freq: Four times a day (QID) | RECTAL | Status: DC | PRN

## 2023-11-01 MED ORDER — GLYCOPYRROLATE 0.2 MG/ML IJ SOLN: 0.1000 mg | Freq: Three times a day (TID) | INTRAMUSCULAR | Status: DC | PRN

## 2023-11-01 MED ORDER — IOHEXOL 350 MG/ML SOLN
100.0000 mL | Freq: Once | INTRAVENOUS | Status: AC | PRN
  Administered 2023-11-01: 100 mL via INTRAVENOUS

## 2023-11-01 MED ORDER — LACTATED RINGERS IV BOLUS
1000.0000 mL | Freq: Once | INTRAVENOUS | Status: AC
  Administered 2023-11-01: 1000 mL via INTRAVENOUS

## 2023-11-01 MED ORDER — SODIUM CHLORIDE 0.9 % IV SOLN
2.0000 g | Freq: Three times a day (TID) | INTRAVENOUS | Status: DC
  Administered 2023-11-01: 2 g via INTRAVENOUS
  Filled 2023-11-01 (×2): qty 12.5

## 2023-11-01 MED ORDER — GADOBUTROL 1 MMOL/ML IV SOLN
5.0000 mL | Freq: Once | INTRAVENOUS | Status: AC | PRN
  Administered 2023-11-01: 5 mL via INTRAVENOUS

## 2023-11-01 MED ORDER — FERROUS SULFATE 325 (65 FE) MG PO TABS
325.0000 mg | ORAL_TABLET | Freq: Every day | ORAL | Status: DC
  Filled 2023-11-01: qty 1

## 2023-11-01 MED ORDER — IRON-VITAMIN C 100-250 MG PO TABS: ORAL_TABLET | Freq: Every day | ORAL | Status: DC

## 2023-11-01 MED ORDER — ADULT MULTIVITAMIN W/MINERALS CH
1.0000 | ORAL_TABLET | Freq: Every day | ORAL | Status: DC
  Filled 2023-11-01: qty 1

## 2023-11-01 MED ORDER — VANCOMYCIN HCL IN DEXTROSE 1-5 GM/200ML-% IV SOLN
1000.0000 mg | Freq: Once | INTRAVENOUS | Status: AC
  Administered 2023-11-01: 1000 mg via INTRAVENOUS
  Filled 2023-11-01: qty 200

## 2023-11-01 MED ORDER — LORAZEPAM 1 MG PO TABS: 1.0000 mg | ORAL_TABLET | ORAL | Status: DC | PRN

## 2023-11-01 MED ORDER — ALBUTEROL SULFATE (2.5 MG/3ML) 0.083% IN NEBU: 2.5000 mg | INHALATION_SOLUTION | RESPIRATORY_TRACT | Status: DC | PRN

## 2023-11-01 MED ORDER — KATE FARMS STANDARD 1.4 PO LIQD
325.0000 mL | Freq: Two times a day (BID) | ORAL | Status: DC
  Administered 2023-11-01 – 2023-11-02 (×3): 325 mL via ORAL
  Filled 2023-11-01 (×3): qty 325

## 2023-11-01 MED ORDER — SODIUM CHLORIDE 0.9% FLUSH
10.0000 mL | Freq: Two times a day (BID) | INTRAVENOUS | Status: DC
  Administered 2023-11-01 – 2023-11-02 (×2): 10 mL

## 2023-11-01 MED ORDER — ONDANSETRON HCL 4 MG/2ML IJ SOLN: 4.0000 mg | Freq: Four times a day (QID) | INTRAMUSCULAR | Status: DC | PRN

## 2023-11-01 MED ORDER — ONDANSETRON HCL 4 MG PO TABS: 4.0000 mg | ORAL_TABLET | Freq: Four times a day (QID) | ORAL | Status: DC | PRN

## 2023-11-01 MED ORDER — VANCOMYCIN HCL 1250 MG/250ML IV SOLN
1250.0000 mg | INTRAVENOUS | Status: DC
  Filled 2023-11-01: qty 250

## 2023-11-01 MED ORDER — ZINC CHLORIDE 1 MG/ML IV SOLN
INTRAVENOUS | Status: DC
  Filled 2023-11-01: qty 1050

## 2023-11-01 MED ORDER — ACETAMINOPHEN 325 MG PO TABS: 650.0000 mg | ORAL_TABLET | Freq: Four times a day (QID) | ORAL | Status: DC | PRN

## 2023-11-01 MED ORDER — SODIUM CHLORIDE 0.9% IV SOLUTION: Freq: Once | INTRAVENOUS | Status: DC

## 2023-11-01 MED ORDER — SODIUM CHLORIDE 0.9% FLUSH: 10.0000 mL | INTRAVENOUS | Status: DC | PRN

## 2023-11-01 MED ORDER — LOPERAMIDE HCL 2 MG PO CAPS: 2.0000 mg | ORAL_CAPSULE | Freq: Four times a day (QID) | ORAL | Status: DC | PRN

## 2023-11-01 MED ORDER — INSULIN ASPART 100 UNIT/ML IJ SOLN: 0.0000 [IU] | Freq: Four times a day (QID) | INTRAMUSCULAR | Status: DC

## 2023-11-01 MED ORDER — INSULIN ASPART 100 UNIT/ML IJ SOLN: 0.0000 [IU] | INTRAMUSCULAR | Status: DC

## 2023-11-01 NOTE — Assessment & Plan Note (Signed)
 Diagnosed 05/2020  Follows with Dr.  Gudena Most recently, patient has been treated with Herceptin  in the outpatient setting  CT imaging on this presentation reveals rapidly progressing disease with numerous liver metastasis with concurrent derangement of LFTs  [name] consulted and unfortunately there are no viable treatment options for this patient with rapidly progressive disease with poor prognosis Palliative Care Consulted.  Comfort measures initiated after a lengthy discussion about goals of care with patient, see comments below.

## 2023-11-01 NOTE — Assessment & Plan Note (Signed)
 Marked lactic acidosis that is persisting Likely secondary to volume depletion and underlying infection Treating underlying infection with antibiotics, will avoid aggressive intravenous volume resuscitation considering comfort measures.

## 2023-11-01 NOTE — Progress Notes (Signed)
 PHARMACY - PHYSICIAN COMMUNICATION CRITICAL VALUE ALERT - BLOOD CULTURE IDENTIFICATION (BCID)  [name] is an 48 y.o. female who presented to [facility] on 10/31/2023 with a chief complaint of abdominal pain. She was started on antibiotics for sepsis.  Assessment:  Bcx 1/4 staph epi, no resistance Pt on abx for sepsis Suspect bcx may be contaminant at this time  Name of physician (or Provider) Contacted: Dr. Kenard  Current antibiotics: vancomycin , ceftriaxone   Changes to prescribed antibiotics recommended: none  Results for orders placed or performed during the hospital encounter of 09/02/23  Blood Culture ID Panel (Reflexed) (Collected: 09/02/2023  2:43 PM)  Result Value Ref Range   Enterococcus faecalis NOT DETECTED NOT DETECTED   Enterococcus Faecium NOT DETECTED NOT DETECTED   Listeria monocytogenes NOT DETECTED NOT DETECTED   Staphylococcus species DETECTED (A) NOT DETECTED   Staphylococcus aureus (BCID) NOT DETECTED NOT DETECTED   Staphylococcus epidermidis DETECTED (A) NOT DETECTED   Staphylococcus lugdunensis NOT DETECTED NOT DETECTED   Streptococcus species NOT DETECTED NOT DETECTED   Streptococcus agalactiae NOT DETECTED NOT DETECTED   Streptococcus pneumoniae NOT DETECTED NOT DETECTED   Streptococcus pyogenes NOT DETECTED NOT DETECTED   A.calcoaceticus-baumannii NOT DETECTED NOT DETECTED   Bacteroides fragilis NOT DETECTED NOT DETECTED   Enterobacterales NOT DETECTED NOT DETECTED   Enterobacter cloacae complex NOT DETECTED NOT DETECTED   Escherichia coli NOT DETECTED NOT DETECTED   Klebsiella aerogenes NOT DETECTED NOT DETECTED   Klebsiella oxytoca NOT DETECTED NOT DETECTED   Klebsiella pneumoniae NOT DETECTED NOT DETECTED   Proteus species NOT DETECTED NOT DETECTED   Salmonella species NOT DETECTED NOT DETECTED   Serratia marcescens NOT DETECTED NOT DETECTED   Haemophilus influenzae NOT DETECTED NOT DETECTED   Neisseria meningitidis NOT DETECTED NOT  DETECTED   Pseudomonas aeruginosa NOT DETECTED NOT DETECTED   Stenotrophomonas maltophilia NOT DETECTED NOT DETECTED   Candida albicans NOT DETECTED NOT DETECTED   Candida auris NOT DETECTED NOT DETECTED   Candida glabrata NOT DETECTED NOT DETECTED   Candida krusei NOT DETECTED NOT DETECTED   Candida parapsilosis NOT DETECTED NOT DETECTED   Candida tropicalis NOT DETECTED NOT DETECTED   Cryptococcus neoformans/gattii NOT DETECTED NOT DETECTED   Methicillin resistance mecA/C NOT DETECTED NOT DETECTED    Stefano MARLA Bologna, PharmD, BCPS Clinical Pharmacist 11/01/2023 3:58 PM

## 2023-11-01 NOTE — Assessment & Plan Note (Signed)
 Completion of this course of antibiotic treatment will likely contribute to patient's comfort Urinalysis and chest imaging revealed no evidence of source of infection Procalcitonin is markedly elevated highly suggestive of a bacterial infection Due to abdominal pain and significant ascites, suspicion is that patient is suffering from SBP Continuing ceftriaxone  at this time Paracentesis was originally ordered earlier today and was planned for tomorrow, will need to discuss whether patient still wishes to proceed now that she wishes to focus on comfort.

## 2023-11-01 NOTE — Consult Note (Signed)
 Referring Provider: Dr. Zachary Ba Primary Care Physician:  [name], MD Primary Gastroenterologist:  Dr. Avram  Reason for Consultation: Hyperbilirubinemia  HPI: [name] is a 48 y.o. female with a past medical history of IDA, Crohn's colitis and small bowel disease initially diagnosed in 2008 with stricture s/p subtotal colectomy 11/2022 with recurrent small bowel obstructions treated with Entyvio  2023 which was discontinued early 2024 due to recurrent breast cancer with metastatic disease requiring chemotherapy and later developed short gut syndrome on TPN. PEA cardiac arrest during hospitalization 12/2022. Recent hospitalization 08/2023 with pneumonia.  She presented to the ED 10/31/2023 due to having RUQ pain, dark-colored urine and outpatient labs showed leukocytosis.  Labs in the ED showed a WBC count of 18.3.  Hemoglobin 7.3 down from 8.8 three weeks ago.  Hematocrit 23.4.  Platelets 386.  Sodium 135.  Potassium 4.2.  Glucose 86.  BUN 26.  Calcium  8.7.  Total bili 6.6.  Alk phos 143.  AST 226.  ALT 61. (Total bili 0.6, alk phos 285, AST 115 and ALT 82 three weeks ago). Anion gap 17.  Lactic acid 2.9 -> 2.8.  Small amount of urine bilirubin.  Blood and urine cultures pending. Sepsis protocol was initiated and she was started on IV antibiotics.  Labs today: WBC 17.6.  Hemoglobin 6.3.  Hematocrit 20.3.  MCV 73.3.  Platelets 375.  HIV nonreactive.  Sodium 131.  BUN 19.  Creatinine 0.71.  Total bili 6.6.  Alk phos 124.  AST 191.  ALT 52.  Iron  12.  Saturation ratios 9.  TIBC 141.  Ferritin 1245.  Vitamin B12 level 661.  Folate 17.1.  Patient initially declined PRBC transfusion but is down to agreeable for transfusion.  RN at the bedside stated the hospitalist was notified and will order PRBC transfusion.  CTA negative for PE, identified a small right pleural effusion and prior left lower lobe pneumonia resolved  CTAP with contrast showed progressive hepatic metastasis with  posttreatment changes in the left liver with new moderate abdominal/pelvic ascites, no biliary ductal dilatation, gallbladder was unremarkable and evidence of past right mastectomy with radiation changes in the anterior right hemothorax.  An abdominal MRI/MRCP was completed earlier today, results pending.  She is followed by oncologist [name], started on Herceptin  targeted therapy 3 weeks ago.  A GI consult was requested for further evaluation regarding hyperbilirubinemia concerning for biliary obstruction secondary to liver metastasis.  She endorses having a decreased appetite for the past week. RUQ pain started 2 to 3 days ago. No nausea or vomiting. No lower abdominal pain.  No significant changes in her ileostomy output. She takes Imodium  as needed and empties her ileostomy bag 3-4 times daily, stool is loose to thick brown, no bloody or black stools.  She also noticed her urine has been darker in color for the past few days as well.  She tolerates solid food and has been on home TPN since 12/2022.   MOST RECENT GI PROCEDURES:  Flexible sigmoidoscopy 11/21/2022: - Perianal skin tags found on perianal exam. Hemorrhoids found on digital rectal exam. - Non-bleeding non-thrombosed internal hemorrhoids.  - Granularity in the remaining rectum/rectosigmoid. Biopsied.  - Patent end-to-side ileo-colonic anastomosis, characterized by stenosis, congestion, erythema, friable mucosa, inflammation and severe stenosis. This was approximately 5 mm in diameter and 4 cm in length and only traversed with the neonatal endoscope. There is no ability to perform stenting.  - Dilated neoterminal ileum. Suctioned via endoscope.  - Segmental mild mucosal  changes were found. Biopsied  Upper endoscopy 11/21/2022: - No gross lesions in the entire esophagus. Z-line irregular, 34 cm from the incisors.  - Erythematous mucosa in the stomach. Biopsied.  - No gross lesions in the duodenal bulb, in the first portion of the  duodenum, in the second portion of the duodenum, in the major papilla and in the third portion of the duodenum. Biopsied.  A. DUODENUM, BIOPSY:  - Benign small bowel mucosa with focal foveolar metaplasia, suggestive  of peptic injury   B. STOMACH, BIOPSY:  - Gastric antral and oxyntic mucosa with no specific pathologic changes  - Negative for H. pylori on HE stain  - Negative for intestinal metaplasia or malignancy   C. NEOTERMINAL ILEUM, BIOPSY:  - Benign ileal mucosa with mild lamina propria edema  - Negative for features of chronicity or acute inflammation   D. ANASTOMOSIS STRICTURE, BIOPSY:  - Ulcerated mucosa with acute and chronic inflammation, and granulation  tissue, consistent with anastomotic site   E. RECTUM, BIOPSY:  - Benign colorectal mucosa with minimal architectural disarray  - Negative for acute inflammation  - Negative for dysplasia or malignancy    Flexible sigmoidoscopy 09/07/2021:  - Anastomotic stenosis with ulcers and edema s/o recurrence of Crohn's disease. Biopsied.  - Crohn's proctitis- biopsied   A. COLON, ILEO-SIGMOID ANASTOMOSIS, BIOPSY:  - Enteric mucosa with nonspecific architectural and inflammatory  changes, consistent with anastomotic site  - Negative for dysplasia or granulomas   B. RECTUM, BIOPSY:  - Moderately active chronic colitis, consistent with patient's clinical  history of Crohn's disease  - Negative for dysplasia  COMMENT:  B. An isolated giant cell is identified but appears to be a nonspecific  finding secondary to crypt lysis.   Liver biopsy 09/25/2023: Poorly differentiated carcinoma compatible with metastatic carcinoma of breast origin ER 0%, PR 0%, Ki67 20%, HER2 +3+  ECHO 10/08/2023: Left ventricular ejection fraction, by estimation, is 55 to 60%. The left ventricle has normal function. Left ventricular endocardial border not optimally defined to evaluate regional wall motion. Left ventricular diastolic parameters are  indeterminate. 1. Right ventricular systolic function is normal. The right ventricular size is normal. Tricuspid regurgitation signal is inadequate for assessing PA pressure. 2. The mitral valve was not well visualized. No evidence of mitral valve regurgitation. No evidence of mitral stenosis. 3. 4. The aortic valve was not well visualized. Aortic valve regurgitation is not visualized. The inferior vena cava is normal in size with greater than 50% respiratory variability, suggesting right atrial pressure of 3 mmHg. 5. 6. Technically difficult study with very poor images. LV systolic function grossly normal.   Past Medical History:  Diagnosis Date   Breast cancer (HCC)    right   Colon stricture (HCC) 05/07/2019   Colonic stricture (HCC) 11/19/2022   Crohn's colitis, with intestinal obstruction (HCC) 09/17/2013   Dx 2006 - right and left colon involved 2011 - pan-colitis 09/17/2013 left colitis 60-20 cm March 2021 subtotal colectomy ileosigmoid anastomosis for colonic strictures-Dr. Debby     Crohn's disease [facility])    Exacerbation of Crohn's disease of large intestine (HCC) 12/13/2022   Family history of breast cancer 06/11/2020   History of abdominal subtotal colectomy 11/19/2022   Iron  deficiency anemia secondary to blood loss (chronic) - Crohn's colitis 10/17/2010   Rectal bleeding    Uterine fibroid    Vitamin D  deficiency 09/24/2013    Past Surgical History:  Procedure Laterality Date   AXILLARY LYMPH NODE DISSECTION Right 11/09/2020  Procedure: RIGHT AXILLARY LYMPH NODE DISSECTION;  Surgeon: Ebbie Cough, MD;  Location: [facility];  Service: General;  Laterality: Right;   BIOPSY  07/01/2020   Procedure: BIOPSY;  Surgeon: Leigh Elspeth SQUIBB, MD;  Location: [facility];  Service: Gastroenterology;;   BIOPSY  01/27/2021   Procedure: BIOPSY;  Surgeon: Leigh Elspeth SQUIBB, MD;  Location: [facility];  Service: Gastroenterology;;   BIOPSY  09/07/2021   Procedure:  BIOPSY;  Surgeon: Charlanne Groom, MD;  Location: [facility];  Service: Gastroenterology;;   BIOPSY  11/21/2022   Procedure: BIOPSY;  Surgeon: Wilhelmenia Aloha Raddle., MD;  Location: [facility];  Service: Gastroenterology;;   BREAST BIOPSY Right 03/24/2022   US  RT BREAST BX W LOC DEV 1ST LESION IMG BX SPEC US  GUIDE 03/24/2022 [facility]   BREAST BIOPSY  04/28/2022   US  RT RADIOACTIVE SEED LOC 04/28/2022 [facility]   BREAST IMPLANT REMOVAL Right 08/07/2022   Procedure: REMOVAL  OF RIGHT BREAST IMPLANTS;  Surgeon: Elisabeth Craig RAMAN, MD;  Location: [facility];  Service: Plastics;  Laterality: Right;   BREAST RECONSTRUCTION WITH PLACEMENT OF TISSUE EXPANDER AND FLEX HD (ACELLULAR HYDRATED DERMIS) Bilateral 11/09/2020   Procedure: BILATERAL BREAST RECONSTRUCTION WITH PLACEMENT OF TISSUE EXPANDER AND FLEX HD (ACELLULAR HYDRATED DERMIS);  Surgeon: Elisabeth Craig RAMAN, MD;  Location: [facility];  Service: Plastics;  Laterality: Bilateral;   CAPSULOTOMY Bilateral 07/25/2021   Procedure: CAPSULOTOMY;  Surgeon: Elisabeth Craig RAMAN, MD;  Location: [facility];  Service: Plastics;  Laterality: Bilateral;   COLECTOMY  06/27/2019   COLONOSCOPY  2015   ESOPHAGOGASTRODUODENOSCOPY (EGD) WITH PROPOFOL  N/A 11/21/2022   Procedure: ESOPHAGOGASTRODUODENOSCOPY (EGD) WITH PROPOFOL ;  Surgeon: Wilhelmenia Aloha Raddle., MD;  Location: [facility];  Service: Gastroenterology;  Laterality: N/A;   FLEXIBLE SIGMOIDOSCOPY N/A 07/01/2020   Procedure: FLEXIBLE SIGMOIDOSCOPY;  Surgeon: Leigh Elspeth SQUIBB, MD;  Location: [facility];  Service: Gastroenterology;  Laterality: N/A;   FLEXIBLE SIGMOIDOSCOPY N/A 01/27/2021   Procedure: FLEXIBLE SIGMOIDOSCOPY;  Surgeon: Leigh Elspeth SQUIBB, MD;  Location: [facility];  Service: Gastroenterology;  Laterality: N/A;   FLEXIBLE SIGMOIDOSCOPY N/A 09/07/2021   Procedure: FLEXIBLE SIGMOIDOSCOPY;  Surgeon: Charlanne Groom, MD;  [facility];  Service: Gastroenterology;  Laterality: N/A;   FLEXIBLE SIGMOIDOSCOPY N/A 11/21/2022   Procedure: FLEXIBLE SIGMOIDOSCOPY;  Surgeon: Wilhelmenia Aloha Raddle., MD;  Location: [facility];  Service: Gastroenterology;  Laterality: N/A;   IR CATHETER TUBE CHANGE  03/07/2023   IR FLUORO GUIDE CV LINE RIGHT  01/09/2023   IR PATIENT EVAL TECH 0-60 MINS  04/24/2023   IR RADIOLOGIST EVAL & MGMT  04/13/2023   IR RADIOLOGIST EVAL & MGMT  05/07/2023   IR US  GUIDE VASC ACCESS RIGHT  01/09/2023   LAPAROTOMY N/A 12/14/2022   Procedure: EXPLORATORY LAPAROTOMY WITH SMALL BOWEL RESECTION;  Surgeon: Polly Cordella LABOR, MD;  Location: [facility];  Service: General;  Laterality: N/A;   LAPAROTOMY N/A 12/16/2022   Procedure: REOPENING LAPAROTOMY, ILEOSTOMY;  ABDOMINAL CLOSURE;  Surgeon: Polly Cordella LABOR, MD;  Location: [facility];  Service: General;  Laterality: N/A;   NIPPLE SPARING MASTECTOMY Bilateral 11/09/2020   Procedure: BILATERAL NIPPLE SPARING MASTECTOMY;  Surgeon: Ebbie Cough, MD;  Location: [facility];  Service: General;  Laterality: Bilateral;   PORT-A-CATH REMOVAL N/A 07/25/2021   Procedure: REMOVAL PORT-A-CATH;  Surgeon: Elisabeth Craig RAMAN, MD;  Location: [facility];  Service: Plastics;  Laterality: N/A;   PORT-A-CATH REMOVAL Left 01/05/2023   Procedure: REMOVAL  PORT-A-CATH;  Surgeon: Vernetta Berg, MD;  Location: [facility];  Service: General;  Laterality: Left;   PORTACATH PLACEMENT N/A 06/23/2020   Procedure: INSERTION PORT-A-CATH;  Surgeon: Ebbie Cough, MD;  Location: [facility];  Service: General;  Laterality: N/A;  START TIME OF 11:00 AM FOR 60 MINUTES [place] [facility]   PORTACATH PLACEMENT Left 05/02/2022   Procedure: INSERTION PORT-A-CATH;  Surgeon: Ebbie Cough, MD;  Location: [facility];  Service: General;  Laterality: Left;   RADIOACTIVE SEED GUIDED EXCISIONAL BREAST BIOPSY Right 05/02/2022   Procedure: RADIOACTIVE SEED GUIDED  RIGHT BREAST MASS EXCISION;  Surgeon: Ebbie Cough, MD;  Location: [facility];  Service: General;  Laterality: Right;   REMOVAL OF BILATERAL TISSUE EXPANDERS WITH PLACEMENT OF BILATERAL BREAST IMPLANTS Bilateral 07/25/2021   Procedure: REMOVAL OF BILATERAL TISSUE EXPANDERS WITH PLACEMENT OF BILATERAL BREAST IMPLANTS;  Surgeon: Elisabeth Craig RAMAN, MD;  Location:  [facility];  Service: Plastics;  Laterality: Bilateral;  1.5    Prior to Admission medications   Medication Sig Start Date End Date Taking? Authorizing Provider  acetaminophen  (TYLENOL ) 500 MG tablet Take 500 mg by mouth daily as needed for mild pain (pain score 1-3) or moderate pain (pain score 4-6).   Yes [provider]  IRON -VITAMIN C  PO Take 1 capsule by mouth daily.   Yes [provider]  Multiple Vitamins-Minerals (MULTIVITAMIN WITH MINERALS) tablet Take 1 tablet by mouth daily.   Yes [provider]  sterile water SOLN with amino acids  10 % SOLN 1.3 g/kg, dextrose  70 % SOLN 20 % Inject into the vein See admin instructions. Infuse TPN 3/1 1500 ml IV through PICC Line Via ambulatory infusion pump over 16 Hour with 1 Hour Ramp Up & 1 Hour Ramp Down 3 X Week (MWF). Add 10 ml MVI Daily Prior To Infusion. Bag Volume: 1550 ml  Base Formula           Amount/Day         Amount/L  Dextrose  70%          175.00 Gm           116.67 Gm Plenamine 15%        80.00 Gm             53.33 Gm SMOFlipid 20%        40.00 Gm             26.67 Gm  Electrolytes  Calcium  Glucona     6.00 meq              4.00 meq Magnesium  Sulf      8.00 meq               5.33 meq Sodium Chloride      230 meq                153.33 meq Potassium Phos     15 mM                    10.00 mM K Acetate                67.00 meq              44.67 meq  Vitamins, trace elements and medications Tralement               1.00 mL  0.67 mL     14 hours a day   Yes [provider]  apixaban   (ELIQUIS ) 5 MG TABS tablet Take 5 mg by mouth 2 (two) times daily. Patient not taking: Reported on 11/01/2023 02/21/23   [provider]  fludrocortisone  (FLORINEF ) 0.1 MG tablet Take 0.5 tablets (0.05 mg total) by mouth daily. Patient not taking: Reported on 11/01/2023 09/09/23   Vann, Jessica U, DO  levofloxacin  (LEVAQUIN ) 750 MG tablet Take 1 tablet (750 mg total) by mouth daily. Patient not taking: Reported on 11/01/2023 09/09/23   Vann, Jessica U, DO  midodrine  (PROAMATINE ) 10 MG tablet Take 10 mg by mouth 2 (two) times daily. Patient not taking: Reported on 11/01/2023 08/27/23   [provider]    Current Facility-Administered Medications  Medication Dose Route Frequency Provider Last Rate Last Admin   acetaminophen  (TYLENOL ) tablet 650 mg  650 mg Oral Q6H PRN Acheampong, Peter K, MD       Or   acetaminophen  (TYLENOL ) suppository 650 mg  650 mg Rectal Q6H PRN Acheampong, Peter K, MD       albuterol  (PROVENTIL ) (2.5 MG/3ML) 0.083% nebulizer solution 2.5 mg  2.5 mg Nebulization Q2H PRN Acheampong, Peter K, MD       ceFEPIme  (MAXIPIME ) 2 g in sodium chloride  0.9 % 100 mL IVPB  2 g Intravenous Q8H Acheampong, Peter K, MD 200 mL/hr at 11/01/23 0415 2 g at 11/01/23 0415   cholecalciferol  (VITAMIN D3) 25 MCG (1000 UNIT) tablet 2,000 Units  2,000 Units Oral Daily Acheampong, Maude POUR, MD       ferrous sulfate  tablet 325 mg  325 mg Oral Q breakfast Acheampong, Peter K, MD       loperamide  (IMODIUM ) capsule 2 mg  2 mg Oral QID PRN Acheampong, Peter K, MD       multivitamin with minerals tablet 1 tablet  1 tablet Oral Daily Acheampong, Maude POUR, MD       ondansetron  (ZOFRAN ) tablet 4 mg  4 mg Oral Q6H PRN Acheampong, Peter K, MD       Or   ondansetron  (ZOFRAN ) injection 4 mg  4 mg Intravenous Q6H PRN Acheampong, Maude POUR, MD       sodium chloride  flush (NS) 0.9 % injection 10-40 mL  10-40 mL Intracatheter Q12H Acheampong, Maude POUR, MD   10 mL at 11/01/23 1000   sodium chloride  flush (NS) 0.9  % injection 10-40 mL  10-40 mL Intracatheter PRN Marca Maude POUR, MD       [START ON 11/02/2023] vancomycin  (VANCOREADY) IVPB 1250 mg/250 mL  1,250 mg Intravenous Q24H Acheampong, Peter K, MD        Allergies as of 10/31/2023 - Review Complete 10/31/2023  Allergen Reaction Noted   Nsaids Other (See Comments) 05/27/2021   Chlorhexidine  gluconate Itching 12/28/2022   Hydromorphone  Other (See Comments) 12/23/2022   Lactose Other (See Comments) 12/29/2022   Lactose intolerance (gi) Other (See Comments) 12/29/2022   Vancomycin  Itching 05/09/2023    Family History  Problem Relation Age of Onset   Diabetes Father    Breast cancer Maternal Aunt        dx unknown age   Diabetes Paternal Aunt    Breast cancer Cousin        maternal cousin; dx unknown age   Esophageal cancer Neg Hx    Rectal cancer Neg Hx    Stomach cancer Neg Hx    Colon polyps Neg Hx    Colon cancer Neg  Hx     Social History   Socioeconomic History   Marital status: Single    Spouse name: Not on file   Number of children: 1   Years of education: 13   Highest education level: Not on file  Occupational History   Occupation: Medical Coder   Tobacco Use   Smoking status: Former    Types: Cigarettes   Smokeless tobacco: Never  Vaping Use   Vaping status: Never Used  Substance and Sexual Activity   Alcohol use: Not Currently    Alcohol/week: 1.0 - 2.0 standard drink of alcohol    Types: 1 - 2 Glasses of wine per week   Drug use: No   Sexual activity: Yes    Partners: Male    Comment: Intermittent protection not consistent  Other Topics Concern   Not on file  Social History Narrative   Single, 1 daughter born 2001 approximately   Energy manager for [employer]   She is a former smoker, occasional alcohol no drug use   Social Drivers of Corporate investment banker Strain: Low Risk  (10/15/2023)   Received from [facility]   Overall Financial Resource Strain (CARDIA)    Difficulty of  Paying Living Expenses: Not hard at all  Food Insecurity: No Food Insecurity (11/01/2023)   Hunger Vital Sign    Worried About Running Out of Food in the Last Year: Never true    Ran Out of Food in the Last Year: Never true  Transportation Needs: No Transportation Needs (11/01/2023)   PRAPARE - Administrator, Civil Service (Medical): No    Lack of Transportation (Non-Medical): No  Physical Activity: Insufficiently Active (10/15/2023)   Received from [facility]   Exercise Vital Sign    On average, how many days per week do you engage in moderate to strenuous exercise (like a brisk walk)?: 2 days    On average, how many minutes do you engage in exercise at this level?: 10 min  Stress: No Stress Concern Present (10/15/2023)   Received from [facility] - Occupational Stress Questionnaire    Feeling of Stress : Not at all  Social Connections: Unknown (10/15/2023)   Received from [facility]   Social Connection and Isolation Panel    In a typical week, how many times do you talk on the phone with family, friends, or neighbors?: More than three times a week    How often do you get together with friends or relatives?: Never    How often do you attend church or religious services?: Patient declined    Do you belong to any clubs or organizations such as church groups, unions, fraternal or athletic groups, or school groups?: Patient declined    How often do you attend meetings of the clubs or organizations you belong to?: Patient declined    Are you married, widowed, divorced, separated, never married, or living with a partner?: Never married  Intimate Partner Violence: Not At Risk (11/01/2023)   Humiliation, Afraid, Rape, and Kick questionnaire    Fear of Current or Ex-Partner: No    Emotionally Abused: No    Physically Abused: No    Sexually Abused: No    Review of Systems: Gen: + Chills.   CV: Denies chest pain, palpitations or edema. Resp: Denies cough, shortness of breath of hemoptysis.  GI: See HPI.  No GERD symptoms. GU :  Denies urinary burning, blood in urine, increased urinary frequency or incontinence. MS: Denies joint pain, muscles aches or weakness. Derm: Denies rash, itchiness, skin lesions or unhealing ulcers. Psych: Denies depression, anxiety, memory loss or confusion. Heme: Denies easy bruising, bleeding. Neuro:  Denies headaches, dizziness or paresthesias. Endo:  Denies any problems with DM, thyroid  or adrenal function.  Physical Exam: Vital signs in last 24 hours: Temp:  [98.2 F (36.8 C)-99.3 F (37.4 C)] 98.6 F (37 C) (07/24 0418) Pulse Rate:  [91-130] 104 (07/24 0848) Resp:  [16-29] 16 (07/24 0848) BP: (110-124)/(73-82) 124/78 (07/24 0848) SpO2:  [98 %-100 %] 100 % (07/24 0848) Weight:  [54.3 kg-57.4 kg] 54.3 kg (07/24 0418) Last BM Date : 11/01/23 General: Alert 48 year old female in no acute distress. Head:  Normocephalic and atraumatic. Eyes:  No scleral icterus. Conjunctiva pink. Ears:  Normal auditory acuity. Nose:  No deformity, discharge or lesions. Mouth: Dentition intact. No ulcers or lesions.  Neck:  Supple. No lymphadenopathy or thyromegaly.  Lungs: Breath sounds clear throughout. No wheezes, rhonchi or crackles.  Heart: Tachycardic, no murmurs. Abdomen: Epigastrium and RUQ area is firm likely secondary to liver metastasis, no tenderness at this time.  Positive bowel sounds to all 4 quadrants.  Left abdominal ileostomy intact. Rectal: Deferred. Musculoskeletal:  Symmetrical without gross deformities.  Pulses:  Normal pulses noted. Extremities: Without clubbing or edema. Neurologic:  Alert and oriented x 4. No focal deficits.  Skin:  Intact without significant lesions or rashes. Psych:  Alert and cooperative. Normal mood and affect.  Intake/Output from previous day: 07/23 0701 - 07/24 0700 In: 1000 [IV Piggyback:1000] Out: -   Intake/Output this shift: No intake/output data recorded.  Lab Results: Recent Labs    10/31/23 1758 11/01/23 0626  WBC 18.3* 17.6*  HGB 7.3* 6.3*  HCT 23.4* 20.3*  PLT 386 375   BMET Recent Labs    10/31/23 1758 11/01/23 0626  NA 135 131*  K 4.2 4.1  CL 99 99  CO2 19* 22  GLUCOSE 86 53*  BUN 26* 19  CREATININE 0.87 0.71  CALCIUM  8.7* 8.0*   LFT Recent Labs    11/01/23 0626  PROT 6.8  ALBUMIN  1.7*  AST 191*  ALT 52*  ALKPHOS 124  BILITOT 6.6*   PT/INR No results for input(s): LABPROT, INR in the last 72 hours. Hepatitis Panel No results for input(s): HEPBSAG, HCVAB, HEPAIGM, HEPBIGM in the last 72 hours.    Studies/Results: CT Angio Chest PE W and/or Wo Contrast Result Date: 11/01/2023 EXAM: CTA CHEST PE WITH CONTRAST CT ABDOMEN AND PELVIS WITH CONTRAST 11/01/2023 01:11:08 AM TECHNIQUE: CTA of the chest was performed after the administration of intravenous contrast. Multiplanar reformatted images are provided for review. MIP images are provided for review. CT of the abdomen and pelvis was performed with the administration of intravenous contrast. Automated exposure control, iterative reconstruction, and/or weight based adjustment of the mA/kV was utilized to reduce the radiation dose to as low as reasonably achievable. COMPARISON: CT chest / abdomen / pelvis dated 09/02/2023 and CT abdomen dated 08/28/2023. CLINICAL HISTORY: Biliary obstruction suspected (Ped 0-17y). Using TPN for nutrition and has been unable to eat and hydrate as normal. Pt is having RUQ pain and was told that her white count was elevated. FINDINGS: CHEST: PULMONARY ARTERIES: Pulmonary arteries are adequately opacified for evaluation. No intraluminal filling defect to suggest pulmonary embolism. Main pulmonary artery is normal in caliber. MEDIASTINUM: No mediastinal lymphadenopathy. The heart and pericardium demonstrate no acute abnormality.  There is no acute abnormality of the thoracic  aorta. LUNGS AND PLEURA: Mild compressive atelectasis in the right lower lobe. Mild left basilar scarring. Prior left lower lobe pneumonia has resolved. Small right pleural effusion, new. SOFT TISSUES AND BONES: Status post right mastectomy. Left breast augmentation. Radiation changes in the anterior right hemithorax. ABDOMEN AND PELVIS: LIVER: Innumerable hepatic metastases in the right hepatic lobe, many of which are new, with a dominant 5.5 cm right hepatic lesion (image 24) which previously measured 3.4 cm. 3.7 cm central left hepatic lobe lesion (image 17), previously 4.7 cm, with decreased perfusion of the majority of the left hepatic lobe suggesting posttreatment changes. GALLBLADDER AND BILE DUCTS: Gallbladder is unremarkable. No biliary ductal dilatation. SPLEEN: Spleen demonstrates no acute abnormality. PANCREAS: Pancreas demonstrates no acute abnormality. ADRENAL GLANDS: Adrenal glands demonstrate no acute abnormality. KIDNEYS, URETERS AND BLADDER: No stones in the kidneys or ureters. No hydronephrosis. No perinephric or periureteral stranding. Urinary bladder is underdistended but unremarkable. GI AND BOWEL: Status post subtotal colectomy with left lower quadrant ileostomy and hartmann's pouch. No abnormal bowel wall thickening or distension. REPRODUCTIVE: Uterine fibroids. PERITONEUM AND RETROPERITONEUM: Moderate abdominopelvic ascites, new, including loculated fluid beneath the left anterior abdominal wall (image 44). LYMPH NODES: No lymphadenopathy. BONES AND SOFT TISSUES: No acute abnormality of the visualized bones. No focal soft tissue abnormality. IMPRESSION: 1. No evidence of pulmonary embolism. 2. Small right pleural effusion, new. Prior left lower lobe pneumonia has resolved. 3. Progressive hepatic metastases with post-treatment changes in the left liver, as above. 4. Moderate abdominopelvic ascites, new, partially loculated. 5. Status post right mastectomy. Radiation changes in the anterior  right hemithorax. Electronically signed by: Pinkie Pebbles MD 11/01/2023 01:31 AM EDT RP Workstation: [id number]   CT ABDOMEN PELVIS W CONTRAST Result Date: 11/01/2023 EXAM: CTA CHEST PE WITH CONTRAST CT ABDOMEN AND PELVIS WITH CONTRAST 11/01/2023 01:11:08 AM TECHNIQUE: CTA of the chest was performed after the administration of intravenous contrast. Multiplanar reformatted images are provided for review. MIP images are provided for review. CT of the abdomen and pelvis was performed with the administration of intravenous contrast. Automated exposure control, iterative reconstruction, and/or weight based adjustment of the mA/kV was utilized to reduce the radiation dose to as low as reasonably achievable. COMPARISON: CT chest / abdomen / pelvis dated 09/02/2023 and CT abdomen dated 08/28/2023. CLINICAL HISTORY: Biliary obstruction suspected (Ped 0-17y). Using TPN for nutrition and has been unable to eat and hydrate as normal. Pt is having RUQ pain and was told that her white count was elevated. FINDINGS: CHEST: PULMONARY ARTERIES: Pulmonary arteries are adequately opacified for evaluation. No intraluminal filling defect to suggest pulmonary embolism. Main pulmonary artery is normal in caliber. MEDIASTINUM: No mediastinal lymphadenopathy. The heart and pericardium demonstrate no acute abnormality. There is no acute abnormality of the thoracic aorta. LUNGS AND PLEURA: Mild compressive atelectasis in the right lower lobe. Mild left basilar scarring. Prior left lower lobe pneumonia has resolved. Small right pleural effusion, new. SOFT TISSUES AND BONES: Status post right mastectomy. Left breast augmentation. Radiation changes in the anterior right hemithorax. ABDOMEN AND PELVIS: LIVER: Innumerable hepatic metastases in the right hepatic lobe, many of which are new, with a dominant 5.5 cm right hepatic lesion (image 24) which previously measured 3.4 cm. 3.7 cm central left hepatic lobe lesion (image 17), previously 4.7  cm, with decreased perfusion of the majority of the left hepatic lobe suggesting posttreatment changes. GALLBLADDER AND BILE DUCTS: Gallbladder is unremarkable. No biliary ductal dilatation. SPLEEN: Spleen  demonstrates no acute abnormality. PANCREAS: Pancreas demonstrates no acute abnormality. ADRENAL GLANDS: Adrenal glands demonstrate no acute abnormality. KIDNEYS, URETERS AND BLADDER: No stones in the kidneys or ureters. No hydronephrosis. No perinephric or periureteral stranding. Urinary bladder is underdistended but unremarkable. GI AND BOWEL: Status post subtotal colectomy with left lower quadrant ileostomy and hartmann's pouch. No abnormal bowel wall thickening or distension. REPRODUCTIVE: Uterine fibroids. PERITONEUM AND RETROPERITONEUM: Moderate abdominopelvic ascites, new, including loculated fluid beneath the left anterior abdominal wall (image 44). LYMPH NODES: No lymphadenopathy. BONES AND SOFT TISSUES: No acute abnormality of the visualized bones. No focal soft tissue abnormality. IMPRESSION: 1. No evidence of pulmonary embolism. 2. Small right pleural effusion, new. Prior left lower lobe pneumonia has resolved. 3. Progressive hepatic metastases with post-treatment changes in the left liver, as above. 4. Moderate abdominopelvic ascites, new, partially loculated. 5. Status post right mastectomy. Radiation changes in the anterior right hemithorax. Electronically signed by: Pinkie Pebbles MD 11/01/2023 01:31 AM EDT RP Workstation: [id number]    IMPRESSION/PLAN:  48 year old female with recurrent breast cancer with liver metastasis recently started on Herceptin  admitted 7/23 with RUQ pain and hyperbilirubinemia. T. Bili 6.6. Alk phos 143 -> 124. AST 226 -> 101. ALT 61 -> 52. CTAP with contrast showed progressive hepatic metastasis with post treatment changes in the left liver with new moderate abdominal/pelvic ascites and no evidence of biliary ductal dilatation.  Abdominal MRI/MRCP ordered to rule  out biliary obstruction. Hemodynamically stable. - Await abdominal MRI/MRCP results - Add direct bili to earlier a.m. lab draw - ?  Paracentesis to rule out SBP, check peritoneal fluid labs including cell count with differential, Gram stain albumin , protein and anaerobic cultures - Diet as tolerated - Continue TPN - Fluids and pain management per the hospitalist - Ondansetron  4mg  po or  IV every 6 hours as needed - Await further recommendations per Dr. Stacia  Crohn's disease, s/p subtotal colectomy with ileal colonic anastomosis narrowing and short gut syndrome. No longer on Entyvio , previously discontinued in 2024 secondary to starting chemotherapy for recurrent breast cancer. Ileostomy output well-controlled on Imodium  as needed.  Albumin  1.7. - Loperamide  2 mg p.o. 4 times daily as needed - Continue TPN  Acute on chronic IDA.  Hg 7.3 -> 6.3. Patient declined PRBC transfusion earlier this morning but now agrees to be transfused.  No overt GI bleeding. - Defer blood transfusion orders to Dr. Kenard - Check H&H posttransfusion - CBC in am  Leukocytosis, on Zosyn , Cefepime   and Vancomycin .  Blood and urine cultures pending.   [name]  11/01/2023, 1: 15 PM

## 2023-11-01 NOTE — H&P (Addendum)
 History and Physical    Patient: [name] DOB: April 25, 1975 DOA: 10/31/2023 DOS: the patient was seen and examined on 11/01/2023 PCP: [name], MD  Patient coming from: Home  Chief Complaint:  Chief Complaint  Patient presents with   Abdominal Pain   HPI: [name] is a 48 y.o. female with medical history significant of metastatic right breast cancer, history of Crohn's disease complicated by stricture and status post colostomy and diverting ileostomy, and deficiency anemia, vitamin D  deficiency.  Patient is currently receiving palliative immunotherapy with oncology group.  She presented to the emergency room on account of right upper quadrant abdominal pain.  She also describes dark discoloration of her urine over the past several days.  She denies any fever or chills.  She denies any sick contacts.  In the ED, patient was noted to be tachycardic with elevated lactic acidosis.  She was also noted to be anemic.  I discussed findings of low hemoglobin and possibly need for PRBC transfusion.  Patient declines at this time.  She denies any hematochezia or blood in ileostomy bag.    Patient was empirically initiated on treatment for presumed sepsis.  Review of Systems: As mentioned in the history of present illness. All other systems reviewed and are negative. Past Medical History:  Diagnosis Date   Breast cancer (HCC)    right   Colon stricture (HCC) 05/07/2019   Colonic stricture (HCC) 11/19/2022   Crohn's colitis, with intestinal obstruction (HCC) 09/17/2013   Dx 2006 - right and left colon involved 2011 - pan-colitis 09/17/2013 left colitis 60-20 cm March 2021 subtotal colectomy ileosigmoid anastomosis for colonic strictures-Dr. Debby     Crohn's disease [facility])    Exacerbation of Crohn's disease of large intestine (HCC) 12/13/2022   Family history of breast cancer 06/11/2020   History of abdominal subtotal colectomy 11/19/2022   Iron  deficiency anemia  secondary to blood loss (chronic) - Crohn's colitis 10/17/2010   Rectal bleeding    Uterine fibroid    Vitamin D  deficiency 09/24/2013   Past Surgical History:  Procedure Laterality Date   AXILLARY LYMPH NODE DISSECTION Right 11/09/2020   Procedure: RIGHT AXILLARY LYMPH NODE DISSECTION;  Surgeon: Ebbie Cough, MD;  Location: [facility];  Service: General;  Laterality: Right;   BIOPSY  07/01/2020   Procedure: BIOPSY;  Surgeon: Leigh Elspeth SQUIBB, MD;  Location: [facility];  Service: Gastroenterology;;   BIOPSY  01/27/2021   Procedure: BIOPSY;  Surgeon: Leigh Elspeth SQUIBB, MD;  Location: [facility];  Service: Gastroenterology;;   BIOPSY  09/07/2021   Procedure: BIOPSY;  Surgeon: Charlanne Groom, MD;  Location: [facility];  Service: Gastroenterology;;   BIOPSY  11/21/2022   Procedure: BIOPSY;  Surgeon: Wilhelmenia Aloha Raddle., MD;  Location: [facility];  Service: Gastroenterology;;   BREAST BIOPSY Right 03/24/2022   US  RT BREAST BX W LOC DEV 1ST LESION IMG BX SPEC US  GUIDE 03/24/2022 [facility]   BREAST BIOPSY  04/28/2022   US  RT RADIOACTIVE SEED LOC 04/28/2022 [facility]   BREAST IMPLANT REMOVAL Right 08/07/2022   Procedure: REMOVAL  OF RIGHT BREAST IMPLANTS;  Surgeon: Elisabeth Craig RAMAN, MD;  Location: [facility];  Service: Plastics;  Laterality: Right;   BREAST RECONSTRUCTION WITH PLACEMENT OF TISSUE EXPANDER AND FLEX HD (ACELLULAR HYDRATED DERMIS) Bilateral 11/09/2020   Procedure: BILATERAL BREAST RECONSTRUCTION WITH PLACEMENT OF TISSUE EXPANDER AND FLEX HD (ACELLULAR HYDRATED DERMIS);  Surgeon: Elisabeth Craig RAMAN, MD;  Location: [facility];  Service: Plastics;  Laterality: Bilateral;   CAPSULOTOMY Bilateral 07/25/2021   Procedure: CAPSULOTOMY;  Surgeon: Elisabeth Craig RAMAN, MD;  Location: [facility];  Service: Plastics;  Laterality: Bilateral;   COLECTOMY  06/27/2019   COLONOSCOPY  2015   ESOPHAGOGASTRODUODENOSCOPY  (EGD) WITH PROPOFOL  N/A 11/21/2022   Procedure: ESOPHAGOGASTRODUODENOSCOPY (EGD) WITH PROPOFOL ;  Surgeon: Wilhelmenia Aloha Raddle., MD;  Location: [facility];  Service: Gastroenterology;  Laterality: N/A;   FLEXIBLE SIGMOIDOSCOPY N/A 07/01/2020   Procedure: FLEXIBLE SIGMOIDOSCOPY;  Surgeon: Leigh Elspeth SQUIBB, MD;  Location: [facility];  Service: Gastroenterology;  Laterality: N/A;   FLEXIBLE SIGMOIDOSCOPY N/A 01/27/2021   Procedure: FLEXIBLE SIGMOIDOSCOPY;  Surgeon: Leigh Elspeth SQUIBB, MD;  Location: [facility];  Service: Gastroenterology;  Laterality: N/A;   FLEXIBLE SIGMOIDOSCOPY N/A 09/07/2021   Procedure: FLEXIBLE SIGMOIDOSCOPY;  Surgeon: Charlanne Groom, MD;  Location: [facility];  Service: Gastroenterology;  Laterality: N/A;   FLEXIBLE SIGMOIDOSCOPY N/A 11/21/2022   Procedure: FLEXIBLE SIGMOIDOSCOPY;  Surgeon: Wilhelmenia Aloha Raddle., MD;  Location: [facility];  Service: Gastroenterology;  Laterality: N/A;   IR CATHETER TUBE CHANGE  03/07/2023   IR FLUORO GUIDE CV LINE RIGHT  01/09/2023   IR PATIENT EVAL TECH 0-60 MINS  04/24/2023   IR RADIOLOGIST EVAL & MGMT  04/13/2023   IR RADIOLOGIST EVAL & MGMT  05/07/2023   IR US  GUIDE VASC ACCESS RIGHT  01/09/2023   LAPAROTOMY N/A 12/14/2022   Procedure: EXPLORATORY LAPAROTOMY WITH SMALL BOWEL RESECTION;  Surgeon: Polly Cordella LABOR, MD;  Location: [facility];  Service: General;  Laterality: N/A;   LAPAROTOMY N/A 12/16/2022   Procedure: REOPENING LAPAROTOMY, ILEOSTOMY;  ABDOMINAL CLOSURE;  Surgeon: Polly Cordella LABOR, MD;  Location: [facility];  Service: General;  Laterality: N/A;   NIPPLE SPARING MASTECTOMY Bilateral 11/09/2020   Procedure: BILATERAL NIPPLE SPARING MASTECTOMY;  Surgeon: Ebbie Cough, MD;  Location: [facility];  Service: General;  Laterality: Bilateral;   PORT-A-CATH REMOVAL N/A 07/25/2021   Procedure: REMOVAL PORT-A-CATH;  Surgeon: Elisabeth Craig RAMAN, MD;  Location: [facility];  Service: Plastics;  Laterality:  N/A;   PORT-A-CATH REMOVAL Left 01/05/2023   Procedure: REMOVAL PORT-A-CATH;  Surgeon: Vernetta Berg, MD;  Location: [facility];  Service: General;  Laterality: Left;   PORTACATH PLACEMENT N/A 06/23/2020   Procedure: INSERTION PORT-A-CATH;  Surgeon: Ebbie Cough, MD;  Location: [facility];  Service: General;  Laterality: N/A;  START TIME OF 11:00 AM FOR 60 MINUTES [place] [facility]   PORTACATH PLACEMENT Left 05/02/2022   Procedure: INSERTION PORT-A-CATH;  Surgeon: Ebbie Cough, MD;  Location: [facility];  Service: General;  Laterality: Left;   RADIOACTIVE SEED GUIDED EXCISIONAL BREAST BIOPSY Right 05/02/2022   Procedure: RADIOACTIVE SEED GUIDED RIGHT BREAST MASS EXCISION;  Surgeon: Ebbie Cough, MD;  Location: [facility];  Service: General;  Laterality: Right;   REMOVAL OF BILATERAL TISSUE EXPANDERS WITH PLACEMENT OF BILATERAL BREAST IMPLANTS Bilateral 07/25/2021   Procedure: REMOVAL OF BILATERAL TISSUE EXPANDERS WITH PLACEMENT OF BILATERAL BREAST IMPLANTS;  Surgeon: Elisabeth Craig RAMAN, MD;  Location: [facility];  Service: Plastics;  Laterality: Bilateral;  1.5   Social History:  reports that she has quit smoking. Her smoking use included cigarettes. She has never used smokeless tobacco. She reports that she does not currently use alcohol after a past usage of about 1.0 - 2.0 standard drink of alcohol per week. She reports that she does not use drugs.  Allergies  Allergen Reactions  Nsaids Other (See Comments)    Crohns disease/ IBD   Chlorhexidine  Gluconate Itching    Able to use CHG swabs for ports, reaction with direct skin contact   Hydromorphone  Other (See Comments)    Confusion/sedation   Lactose Other (See Comments)    Upset stomach/diarrhea   Lactose Intolerance (Gi) Other (See Comments)    Upset stomach/diarrhea   Vancomycin  Itching    Red Man's Syndrome    Family History  Problem Relation Age of Onset    Diabetes Father    Breast cancer Maternal Aunt        dx unknown age   Diabetes Paternal Aunt    Breast cancer Cousin        maternal cousin; dx unknown age   Esophageal cancer Neg Hx    Rectal cancer Neg Hx    Stomach cancer Neg Hx    Colon polyps Neg Hx    Colon cancer Neg Hx     Prior to Admission medications   Medication Sig Start Date End Date Taking? Authorizing Provider  acetaminophen  (TYLENOL ) 325 MG tablet Take 2 tablets (650 mg total) by mouth every 6 (six) hours as needed for mild pain (pain score 1-3). 09/08/23   Vann, Jessica U, DO  cholecalciferol  (VITAMIN D3) 25 MCG (1000 UNIT) tablet Take 2,000 Units by mouth daily.    [provider]  fludrocortisone  (FLORINEF ) 0.1 MG tablet Take 0.5 tablets (0.05 mg total) by mouth daily. 09/09/23   Vann, Jessica U, DO  IRON -VITAMIN C  PO Take 1 capsule by mouth daily.    [provider]  levofloxacin  (LEVAQUIN ) 750 MG tablet Take 1 tablet (750 mg total) by mouth daily. 09/09/23   Vann, Jessica U, DO  loperamide  (IMODIUM ) 2 MG capsule Take 2 mg by mouth 4 (four) times daily as needed for diarrhea or loose stools. 08/29/23 08/28/24  [provider]  midodrine  (PROAMATINE ) 10 MG tablet Take 10 mg by mouth 2 (two) times daily. 08/27/23   [provider]  Multiple Vitamins-Minerals (MULTIVITAMIN WITH MINERALS) tablet Take 1 tablet by mouth daily.    [provider]  sterile water SOLN with amino acids  10 % SOLN 1.3 g/kg, dextrose  70 % SOLN 20 % Inject into the vein See admin instructions. Infuse TPN 3/1 1500 ml IV through PICC Line Via ambulatory infusion pump over 16 Hour with 1 Hour Ramp Up & 1 Hour Ramp Down 3 X Week (MWF). Add 10 ml MVI Daily Prior To Infusion. Bag Volume: 1550 ml  Base Formula           Amount/Day         Amount/L  Dextrose  70%          175.00 Gm           116.67 Gm Plenamine 15%        80.00 Gm             53.33 Gm SMOFlipid 20%        40.00 Gm             26.67  Gm  Electrolytes  Calcium  Glucona     6.00 meq              4.00 meq Magnesium  Sulf      8.00 meq               5.33 meq Sodium Chloride      230 meq  153.33 meq Potassium Phos     15 mM                    10.00 mM K Acetate                67.00 meq              44.67 meq  Vitamins, trace elements and medications Tralement               1.00 mL                   0.67 mL     14 hours a day    [provider]    Physical Exam: Vitals:   10/31/23 2116 10/31/23 2230 10/31/23 2330 11/01/23 0230  BP: 118/77 112/77 110/75 117/73  Pulse: (!) 123 (!) 121 (!) 112 (!) 105  Resp: 20 19 20 19   Temp: 98.3 F (36.8 C) 98.2 F (36.8 C)  98.3 F (36.8 C)  TempSrc:      SpO2: 100% 99% 99% 99%   General: Patient is a chronically ill looking, cachectic female.  She is pleasant and very well-informed. HEENT: Patient has mask on hands unable to examine oral mucosa adequately.  Pupils are deeply jaundiced. Neck is supple with no JVD Chest: Clinically diminished bilaterally with no added sounds Abdomen: Diverting ileostomy noted. Skin: Negative for any new rash.  No lower extremity edema appreciated CNS shows no focal deficit. Psych: Mood stable.  Data Reviewed: Lactate was 2.9 and 2.8 respectively Sodium was 135, potassium 4.2, chloride 99, bicarb 19, glucose 86, BUN 26, creatinine 0.87, anion gap 17, AST 226, ALT 61 total bilirubin 6.6  WBCs 18.3, hemoglobin 7.3, hematocrit 23.4, platelet count 386 Neutrophil count 14.6  Ct scan :1. No evidence of pulmonary embolism. 2. Small right pleural effusion, new. Prior left lower lobe pneumonia has resolved. 3. Progressive hepatic metastases with post-treatment changes in the left liver, as above. 4. Moderate abdominopelvic ascites, new, partially loculated. 5. Status post right mastectomy. Radiation changes in the anterior right hemithorax.  Assessment and Plan: 48 year old with complicated medical history that includes  metastatic breast cancer, status postmastectomy, history of Crohn's disease status post partial colectomy and currently on Dilantin ileostomy, patient on TPN.  Patient was admitted on account of acute abdominal pain.  Incidental finding of loculated abdominal pelvic ascites fluid.  Increased metastatic liver disease noted.  Worsening total bilirubin concerning for obstructive biliary disease.  #1.  Stage IV breast cancer with metastatic liver disease.  Characterized by elevated liver enzymes.  Patient is currently on immunotherapy.  Follows up with oncology team.  CT scan revealed progressive hepatic metastatic disease.  #2.  Elevated total bilirubin: Concerns for obstructive biliary disease.  Patient may benefit from MRCP to evaluate for any biliary obstruction.  Will consult with GI to help with management.  #3.  Acute on chronic anemia: Microcytosis.  Patient admits compliance with iron  tablets.  Denies any bleeding diathesis at this time.  I offered to transfuse patient with 1 unit of PRBC.  Patient however declines at this time.  Will monitor hemoglobin for now.  If hemoglobin trends below 7, will consider PRBC transfusion.Type and screen ordered  #4.  Sepsis: Characterized by elevated lactic acid.  History of left lower lobe pneumonia with sepsis in May 2025.  CT scan shows pleural effusion but no obvious consolidation.  Urinalysis will also unremarkable.  Loculated fluid found in abdomen/pelvis.  Due to patient's immunocompromise state, patient will be started on empiric IV antibiotics.  Awaiting blood cultures to tailor antibiotics accordingly.  #5. History of crohns Crohn's disease, Short gut syndrome on TPN - Pharmacy and dietitian team will be consulted to help with resumption of TPN  VTE prophylaxis with SCDs only due to worsening anemia.   Advance Care Planning:   Code Status: Full Code   Consults: Gastroenterology consult  Family Communication: No family at bedside  Severity of  Illness: The appropriate patient status for this patient is INPATIENT. Inpatient status is judged to be reasonable and necessary in order to provide the required intensity of service to ensure the patient's safety. The patient's presenting symptoms, physical exam findings, and initial radiographic and laboratory data in the context of their chronic comorbidities is felt to place them at high risk for further clinical deterioration. Furthermore, it is not anticipated that the patient will be medically stable for discharge from the hospital within 2 midnights of admission.   * I certify that at the point of admission it is my clinical judgment that the patient will require inpatient hospital care spanning beyond 2 midnights from the point of admission due to high intensity of service, high risk for further deterioration and high frequency of surveillance required.*  Author: Maude MARLA Dart, MD 11/01/2023 3:12 AM  For on call review [email or web address].

## 2023-11-01 NOTE — Progress Notes (Addendum)
 PHARMACY - TOTAL PARENTERAL NUTRITION CONSULT NOTE   Indication: Short gut syndrome, chronic TPN  Patient Measurements: Weight: 54.3 kg (119 lb 11.4 oz)   Body mass index is 20.55 kg/m. Usual Weight:   Assessment: Patient is a 48 y.o F with hx Crohn's disease (s/p colostomy and diverting ileostomy), metastatic breast cancer to the liver and short gut syndrome on chronic TPN who presented to the  ED on 10/31/23 with c/o abdominal pain.  Pharmacy has been consulted on 11/01/23 to resume TPN for her inpatient.  PTA TPN formula:  - cyclic TPN for 14 hrs with 1 hr taper up/down (4 days a week w/o lipid and with lipid three days a week) - total volume= 1500 mL - Protein= 90gm    ;   dextrose = 200 gm   ;  lipid = 35 gm three days a week   - Na = ~ 150 mEq/L - K = 48.5 meq/L - Ca = 4 meq/L - Mag = 5 meq/L - Phos = 8 mmol/L - CL= 147 meq/L - Acetate = 43 meq/L  - selenium = - zinc = 3mg  - NO trace  Glucose / Insulin : not on insulin  - goal cbgs (<150): 53-86 from CMET Electrolytes: Na low 131; other lytes wnl including CorrCa Renal: scr 0.71, BUN 19 Hepatic: AST/ALT 191/52, Tbili elevated 6.6; alk phos wnl - Albumin  low 1.7 Intake / Output; MIVF:  - I/O: 1000 mL since adm - not on IVF GI Imaging: - 7/24 abd/pelvis CT: neg for PE; small right pleural effusion; Progressive hepatic metastases. Moderate abdominopelvic ascites, new, partially loculated.  - 7/24 abd MRI:  Extensive tumor involvement of all segments of the liver with heavy tumor burden. Substantial narrowing of the portal vein in the porta hepatis and at the confluence of the splenic vein and SMV, likely extrinsic. Substantial ascites. Right pleural effusion  GI Surgeries / Procedures:   Central access: CVC double lumen TPN start date: on chronic TPN  Nutritional Goals: - Due to higher Kcal goal per dietician's recom, will increase total TPN volume to 2100 mL (rate 70-140 mL/hr) and extend infusion time to 16 hrs  (including 1 hr taper up/down) in order to get GIR <7 and meet caloric needs. - TPN will provide 105 gm of protein and 2079 kcal per day. Will add lipid to TPN daily   RD Assessment: Kcal:  2150-2350 Protein:  80-110g Fluid:  2L/day   Current Nutrition:  TPN  Plan:   Today, at 1800:  - Start cyclic TPN for 16 hrs/day (start at 6p and end at 10a) - Electrolytes in TPN:  Na 150 mEq/L K 65mEq/L Ca 4 mEq/L Mg 5 mEq/L Phos 8 mmol/L Cl:Ac 1:1 - Selenium 60 mcg and zinc  3mg  - No trace elements to TPN - MVI PO daily ordered by MD - Initiate very sensitive SSI and check CBGs four times a day (2 hrs past start of TPN, 1 hour after stopping TPN, middle of TPN infusion, and while off TPN) 8p, 2a, 11a, 3p - CMET, phos and mag on 7/25 - Monitor TPN labs on Mon/Thurs at a minimum  Ector Laurel P 11/01/2023,1:18 PM

## 2023-11-01 NOTE — Progress Notes (Signed)
 Patient refused her Morning medications stating that she can only take them after eating. She has been NPO since Midnight for An MRI of the abdomen. Dr Kenard made aware. Will continue to follow.

## 2023-11-01 NOTE — Plan of Care (Signed)

## 2023-11-01 NOTE — Progress Notes (Signed)
   11/01/23 0720  Provider Notification  Provider Name/Title Kenard, MD  Date Provider Notified 11/01/23  Time Provider Notified 0725  Method of Notification Page  Notification Reason Critical Result  Test performed and critical result Hgb 6.3  Date Critical Result Received 11/01/23  Time Critical Result Received 0715  Provider response Evaluate remotely  Date of Provider Response 11/01/23  Time of Provider Response [id number]

## 2023-11-01 NOTE — Progress Notes (Signed)
 Pharmacy Antibiotic Note  [name] is a 48 y.o. female admitted on 10/31/2023 with sepsis.  Pharmacy has been consulted for vancomycin  dosing.  Plan: Vancomycin  1 gm IV x 1 then 1250mg  q24h (AUC 480.6, scr 0.87) Follow renal function, cultures and clinical course  Weight: 57.4 kg (126 lb 8.7 oz)  Temp (24hrs), Avg:98.5 F (36.9 C), Min:98.2 F (36.8 C), Max:99.3 F (37.4 C)  Recent Labs  Lab 10/31/23 1758 10/31/23 2246 11/01/23 0219  WBC 18.3*  --   --   CREATININE 0.87  --   --   LATICACIDVEN  --  2.9* 2.8*    Estimated Creatinine Clearance: 70.5 mL/min (by C-G formula based on SCr of 0.87 mg/dL).    Allergies  Allergen Reactions   Nsaids Other (See Comments)    Crohns disease/ IBD   Chlorhexidine  Gluconate Itching    Able to use CHG swabs for ports, reaction with direct skin contact   Hydromorphone  Other (See Comments)    Confusion/sedation   Lactose Other (See Comments)    Upset stomach/diarrhea   Lactose Intolerance (Gi) Other (See Comments)    Upset stomach/diarrhea   Vancomycin  Itching    Red Man's Syndrome    Antimicrobials this admission: 7/23 zosyn  x 1 7/24 cefepime  >> 7/24 vanc >>  Dose adjustments this admission:   Microbiology results: 7/23 BCx:  7/23 UCx:   Thank you for allowing pharmacy to be a part of this patient's care.  Leeroy Mace RPh 11/01/2023, 3:41 AM

## 2023-11-01 NOTE — Assessment & Plan Note (Signed)
 Lengthy discussions had with the patient by both oncology and myself surrounding patient's extremely poor prognosis and rapidly progressing metastatic disease Oncology has shared with the patient that there are no therapeutic options that would be of benefit to the patient in treating her malignancy and therefore they have recommended hospice Patient states that she is tired of fighting and therefore wishes to transition to comfort measures Patient additionally confirms that she wishes to be DNR Orders of comfort measures and change in CODE STATUS ordered As needed opiate-based analgesics, benzodiazepines, glycopyrrolate , breathing treatments ordered. Message additionally sent to [facility] for assistance in identifying hospice options at discharge.

## 2023-11-01 NOTE — Assessment & Plan Note (Signed)
 Elevated transaminases and bilirubin likely secondary to increasing tumor burden in the liver Gastroenterology originally consulted, their input is appreciated Now, supportive care as patient is comfort measures

## 2023-11-01 NOTE — Progress Notes (Signed)
 PROGRESS NOTE   [name]  FMW:[id number] DOB: 03/18/1976 DOA: 10/31/2023 PCP: [name], MD   Date of Service: the patient was seen and examined on 11/01/2023  Brief Narrative:  48 y.o. 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 PEA cardiac arrest during hospitalization 12/2022.     She is followed by oncologist [name] and wasstarted on Herceptin  targeted therapy 3 weeks ago.  Patient now presents to [facility] emergency department on 7/24 with progressively worsening abdominal pain.  Upon evaluation in the emergency department patient was found to have multiple SIRS criteria including leukocytosis and tachycardia with concurrent lactic acidosis.  CT imaging of the chest abdomen and pelvis was performed revealing rapidly progressing metastatic breast cancer with multiple hepatic metastases.  Patient was initiated on empiric intravenous antibiotics in the hospital [facility] was called to assess the patient for admission to the hospital.  Gastroenterology was initially consulted due to concurrent liver enzyme derangements/hyperbilirubinemia.  They initially had recommended they diagnostic and therapeutic paracentesis due to the patient's abdominal ascites.  [name] with oncology was additionally consulted considering patient's rapidly progressing malignancy.  Due to the patient's rapidly progressive disease and lack of response to Herceptin  he did not feel that there were any additional therapeutic options to explore for the patient's disease and instead recommended hospice care.  After lengthy discussion with the patient  surrounding goals of care, patient was transition to comfort measures on 11/01/2023.       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  Gudena Most recently, patient has been treated with Herceptin  in the outpatient setting  CT imaging on this presentation reveals rapidly progressing disease with numerous liver metastasis with concurrent derangement of LFTs  [name] consulted and unfortunately there are no viable treatment options for this patient with rapidly progressive disease with poor prognosis Palliative Care Consulted.  Comfort measures initiated after a lengthy discussion about goals of care with patient, see comments below. Sepsis due to Gram negative bacteria (HCC) Completion of this course of antibiotic treatment will likely contribute to patient's comfort Urinalysis and chest imaging revealed no evidence of source of infection Procalcitonin is markedly elevated highly suggestive of a bacterial infection Due to abdominal pain and significant ascites, suspicion is that patient is suffering from SBP Continuing ceftriaxone  at this time Paracentesis was originally ordered earlier today and was planned for tomorrow, will need to discuss whether patient still wishes to proceed now that she wishes to focus on comfort. Lactic acidosis Marked lactic acidosis that is persisting Likely secondary to volume depletion and underlying infection Treating underlying infection with antibiotics, will avoid aggressive intravenous volume resuscitation considering comfort measures. Abnormal LFTs Elevated transaminases and bilirubin likely secondary to increasing tumor burden in the liver Gastroenterology originally consulted, their input is appreciated Now, supportive care as patient is comfort measures Symptomatic  anemia Patient's hemoglobin significantly dropped this morning without any gross evidence of bleeding on exam Administering 1 unit packed red blood cells and fusion for comfort, after this transfusion will cease obtaining serial H&H's. Goals  of care, counseling/discussion Lengthy discussions had with the patient by both oncology and myself surrounding patient's extremely poor prognosis and rapidly progressing metastatic disease Oncology has shared with the patient that there are no therapeutic options that would be of benefit to the patient in treating her malignancy and therefore they have recommended hospice Patient states that she is tired of fighting and therefore wishes to transition to comfort measures Patient additionally confirms that she wishes to be DNR Orders of comfort measures and change in CODE STATUS ordered As needed opiate-based analgesics, benzodiazepines, glycopyrrolate , breathing treatments ordered. Message additionally sent to [facility] [employer] for assistance in identifying hospice options at discharge.     Subjective:  Patient is complaining of abdominal pain.  Pain is epigastric in location, mild to moderate in intensity, sore in quality.  Patient is complaining of associated generalized weakness.  Physical Exam:  Vitals:   11/01/23 0720 11/01/23 0848 11/01/23 1240 11/01/23 1833  BP:  124/78 125/84 124/85  Pulse:  (!) 104 (!) 115 (!) 115  Resp: (!) 29 16 16 16   Temp:   98.9 F (37.2 C) 98.8 F (37.1 C)  TempSrc:   Oral Oral  SpO2:  100% 99% 100%  Weight:        Constitutional: Awake alert and oriented x3, no associated distress.   Skin: no rashes, no lesions, good skin turgor noted. Eyes: Pupils are equally reactive to light.  No evidence of scleral icterus or conjunctival pallor.  ENMT: Moist mucous membranes noted.  Posterior pharynx clear of any exudate or lesions.   Respiratory: clear to auscultation bilaterally, no wheezing, no crackles. Normal  respiratory effort. No accessory muscle use.  Cardiovascular: Regular rate and rhythm, no murmurs / rubs / gallops. No extremity edema. 2+ pedal pulses. No carotid bruits.  Abdomen: Abdomen is protuberant with significant fullness in the right upper quadrant.  Abdomen is somewhat tender in the upper quadrants.  Positive bowel sounds noted in all quadrants.   Musculoskeletal: No joint deformity upper and lower extremities. Good ROM, no contractures. Normal muscle tone.    Data Reviewed:  I have personally reviewed and interpreted labs, imaging.  Significant findings are   CBC: Recent Labs  Lab 10/31/23 1758 11/01/23 0626  WBC 18.3* 17.6*  NEUTROABS 14.6*  --   HGB 7.3* 6.3*  HCT 23.4* 20.3*  MCV 73.4* 73.3*  PLT 386 375   Basic Metabolic Panel: Recent Labs  Lab 10/31/23 1758 11/01/23 0626  NA 135 131*  K 4.2 4.1  CL 99 99  CO2 19* 22  GLUCOSE 86 53*  BUN 26* 19  CREATININE 0.87 0.71  CALCIUM  8.7* 8.0*  MG  --  2.0  PHOS  --  4.1   GFR: Estimated Creatinine Clearance: 76.1 mL/min (by C-G formula based on SCr of 0.71 mg/dL). Liver Function Tests: Recent Labs  Lab 10/31/23 1758 11/01/23 0626  AST 226* 191*  ALT 61* 52*  ALKPHOS 143* 124  BILITOT 6.6* 6.6*  PROT 7.9 6.8  ALBUMIN  2.2* 1.7*     Code Status:  DNR.  Code status decision has been confirmed with: patient    Severity of Illness:  The appropriate patient status for this patient is INPATIENT. Inpatient status is judged to be reasonable and necessary in order to provide the required intensity of service to ensure the patient's safety. The patient's presenting symptoms, physical exam findings, and initial radiographic and laboratory data in the context of their chronic comorbidities is felt to place them at  high risk for further clinical deterioration. Furthermore, it is not anticipated that the patient will be medically stable for discharge from the hospital within 2 midnights of admission.   * I  certify that at the point of admission it is my clinical judgment that the patient will require inpatient hospital care spanning beyond 2 midnights from the point of admission due to high intensity of service, high risk for further deterioration and high frequency of surveillance required.*  Time spent:  65 minutes  Author:  Zachary JINNY Ba MD  11/01/2023 8:20 PM

## 2023-11-01 NOTE — Hospital Course (Addendum)
 48 y.o. 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 PEA cardiac arrest during hospitalization 12/2022.     She is followed by oncologist [name] and wasstarted on Herceptin  targeted therapy 3 weeks ago.  Patient now presents to [facility] emergency department on 7/24 with progressively worsening abdominal pain.  Upon evaluation in the emergency department patient was found to have multiple SIRS criteria including leukocytosis and tachycardia with concurrent lactic acidosis.  CT imaging of the chest abdomen and pelvis was performed revealing rapidly progressing metastatic breast cancer with multiple hepatic metastases.  Patient was initiated on empiric intravenous antibiotics in the hospital [facility] was called to assess the patient for admission to the hospital.  Gastroenterology was initially consulted due to concurrent liver enzyme derangements/hyperbilirubinemia.  They initially had recommended they diagnostic and therapeutic paracentesis due to the patient's abdominal ascites.  IR consultation was obtained to proceed with paracentesis but felt that there was not enough fluid to remove.  [name] with oncology was additionally consulted considering patient's rapidly progressing malignancy.  Due to the patient's rapidly progressive disease and lack of response to Herceptin  he did not feel that there were any additional therapeutic options to explore for the patient's disease and instead recommended hospice care.  After lengthy discussion with the patient surrounding goals of care, patient was transition to comfort measures on 11/01/2023.  Patient  was discharged home with home hospice services on 11/02/2023.

## 2023-11-01 NOTE — Progress Notes (Signed)
 ONCOLOGY S; Jaundice and RUQ abd pain    11/01/2023   12:40 PM 11/01/2023    8:48 AM 11/01/2023    4:18 AM  Vitals with BMI  Weight   119 lbs 11 oz  Systolic 125 124 879  Diastolic 84 78 77  Pulse 115 104 110   Exam: Jaundice Abd: RUQ abd pain     Latest Ref Rng & Units 11/01/2023    6:26 AM 10/31/2023    5:58 PM 10/09/2023    2:41 PM  CBC  WBC 4.0 - 10.5 K/uL 17.6  18.3  6.8   Hemoglobin 12.0 - 15.0 g/dL 6.3  7.3  8.8   Hematocrit 36.0 - 46.0 % 20.3  23.4  27.4   Platelets 150 - 400 K/uL 375  386  266       Latest Ref Rng & Units 11/01/2023    6:26 AM 10/31/2023    5:58 PM 10/09/2023    2:41 PM  CMP  Glucose 70 - 99 mg/dL 53  86  894   BUN 6 - 20 mg/dL 19  26  29    Creatinine 0.44 - 1.00 mg/dL 9.28  9.12  9.09   Sodium 135 - 145 mmol/L 131  135  136   Potassium 3.5 - 5.1 mmol/L 4.1  4.2  3.9   Chloride 98 - 111 mmol/L 99  99  102   CO2 22 - 32 mmol/L 22  19  26    Calcium  8.9 - 10.3 mg/dL 8.0  8.7  9.2   Total Protein 6.5 - 8.1 g/dL 6.8  7.9  8.1   Total Bilirubin 0.0 - 1.2 mg/dL 6.6  6.6  0.6   Alkaline Phos 38 - 126 U/L 124  143  285   AST 15 - 41 U/L 191  226  115   ALT 0 - 44 U/L 52  61  82    Metastatic breast cancer: Extensive liver mets. Rapidly progressing. Not responding to Herceptin . Recommended Hospice care since she has no treatment options that she can tolerate Severe Anemia Instructed her to talk to her daughter and communicate her condition. She's worried that her daughter will not be able to handle the news.

## 2023-11-01 NOTE — Progress Notes (Signed)
 Initial Nutrition Assessment  DOCUMENTATION CODES:   Non-severe (moderate) malnutrition in context of chronic illness  INTERVENTION:   Cyclic TPN resuming tonight:             - TPN management per pharmacy.   -MVI in TPN   - Regular diet.  [employer] 1.4 PO BID, each supplement provides 455 kcal and 20 grams protein. -Multivitamin with minerals PO daily  NUTRITION DIAGNOSIS:   Moderate Malnutrition related to chronic illness as evidenced by moderate fat depletion, moderate muscle depletion.  GOAL:   Patient will meet greater than or equal to 90% of their needs   MONITOR:   PO intake, Supplement acceptance (TPN)  REASON FOR ASSESSMENT:   Consult New TPN/TNA  ASSESSMENT:   48 y.o. female with medical history significant of metastatic right breast cancer, history of Crohn's disease complicated by stricture and status post colostomy and diverting ileostomy, and deficiency anemia, vitamin D  deficiency.  Patient is currently receiving palliative immunotherapy with oncology group.  She presented to the emergency room on account of right upper quadrant abdominal pain.  Patient receives home TPN, managed by [facility].  Per RD note from [facility] on 7/14: Cycled over 14 hours, total 1500 ml. Was receiving on average 1190 kcals per day with home regimen. Pt received MVI in TPN as well as takes an MVI orally.   Patient in the past has been able to eat 1 meal a day along with [employer] supplements but lately has not been able to eat at all so relying on TPN for nutrition. Has been on home TPN since September 2024 d/t short gut syndrome (100 cm left of small intestine). Reviewed RD note from [facility], plan was to try and increase home TPN since pt's intakes have not been able to allow for weight maintenance/gain.   Per Pharmacy order, Cyclic TPN to be resumed tonight: Start rate at 70 mL/hr for 1 hours.  Increase rate to 140 mL/hr for 14 hours.  Decrease rate  to 70 mL/hr for 1 hours, then stop.  Provides 2079 kcals and 105g protein.  Per weight records, pt has lost 7 lbs since 5/27 (5% wt loss x 2 months, insignificant for time frame).   Medications: Vitamin D , Ferrous sulfate , Multivitamin with minerals daily  Labs reviewed: Low Na Low iron    NUTRITION - FOCUSED PHYSICAL EXAM:  Flowsheet Row Most Recent Value  Orbital Region Moderate depletion  Upper Arm Region No depletion  Thoracic and Lumbar Region Mild depletion  Buccal Region Moderate depletion  Temple Region Mild depletion  Clavicle Bone Region Moderate depletion  Clavicle and Acromion Bone Region Moderate depletion  Scapular Bone Region Moderate depletion  Dorsal Hand No depletion  Patellar Region Mild depletion  Anterior Thigh Region Mild depletion  Posterior Calf Region Mild depletion  Edema (RD Assessment) None  Hair Reviewed  Eyes Reviewed  Mouth Reviewed  Skin Reviewed  Nails Reviewed    Diet Order:   Diet Order             Diet regular Room service appropriate? Yes; Fluid consistency: Thin  Diet effective now                   EDUCATION NEEDS:   No education needs have been identified at this time  Skin:  Skin Assessment: Reviewed RN Assessment  Last BM:  7/24  Height:   Ht Readings from Last 1 Encounters:  09/02/23 5' 4 (1.626 m)  Weight:   Wt Readings from Last 1 Encounters:  11/01/23 54.3 kg     BMI:  Body mass index is 20.55 kg/m.  Estimated Nutritional Needs:   Kcal:  2150-2350  Protein:  80-110g  Fluid:  2L/day   Morna Lee, MS, RD, LDN Inpatient Clinical Dietitian Contact via Secure chat

## 2023-11-01 NOTE — Assessment & Plan Note (Signed)
 Patient's hemoglobin significantly dropped this morning without any gross evidence of bleeding on exam Administering 1 unit packed red blood cells and fusion for comfort, after this transfusion will cease obtaining serial H&H's.

## 2023-11-02 ENCOUNTER — Other Ambulatory Visit (HOSPITAL_COMMUNITY): Payer: Self-pay

## 2023-11-02 ENCOUNTER — Encounter: Payer: Self-pay | Admitting: Hematology and Oncology

## 2023-11-02 ENCOUNTER — Inpatient Hospital Stay (HOSPITAL_COMMUNITY)

## 2023-11-02 DIAGNOSIS — C50911 Malignant neoplasm of unspecified site of right female breast: Secondary | ICD-10-CM | POA: Diagnosis not present

## 2023-11-02 DIAGNOSIS — C50411 Malignant neoplasm of upper-outer quadrant of right female breast: Secondary | ICD-10-CM | POA: Diagnosis not present

## 2023-11-02 DIAGNOSIS — E440 Moderate protein-calorie malnutrition: Secondary | ICD-10-CM

## 2023-11-02 DIAGNOSIS — R7989 Other specified abnormal findings of blood chemistry: Secondary | ICD-10-CM | POA: Diagnosis not present

## 2023-11-02 DIAGNOSIS — Z7189 Other specified counseling: Secondary | ICD-10-CM | POA: Diagnosis not present

## 2023-11-02 LAB — BPAM RBC
Blood Product Expiration Date: 202508202359
ISSUE DATE / TIME: 202507242113
Unit Type and Rh: 7300

## 2023-11-02 LAB — TYPE AND SCREEN
ABO/RH(D): B POS
Antibody Screen: NEGATIVE
Unit division: 0

## 2023-11-02 LAB — URINE CULTURE: Culture: 20000 — AB

## 2023-11-02 LAB — HEMOGLOBIN AND HEMATOCRIT, BLOOD
HCT: 25.5 % — ABNORMAL LOW (ref 36.0–46.0)
Hemoglobin: 8.3 g/dL — ABNORMAL LOW (ref 12.0–15.0)

## 2023-11-02 MED ORDER — CEFDINIR 300 MG PO CAPS
300.0000 mg | ORAL_CAPSULE | Freq: Two times a day (BID) | ORAL | 0 refills | Status: AC
  Filled 2023-11-02: qty 10, 5d supply, fill #0

## 2023-11-02 MED ORDER — LOPERAMIDE HCL 2 MG PO CAPS
2.0000 mg | ORAL_CAPSULE | Freq: Four times a day (QID) | ORAL | 0 refills | Status: DC | PRN
  Filled 2023-11-02: qty 30, 8d supply, fill #0

## 2023-11-02 MED ORDER — HEPARIN SOD (PORK) LOCK FLUSH 100 UNIT/ML IV SOLN
500.0000 [IU] | Freq: Once | INTRAVENOUS | Status: AC
  Administered 2023-11-02: 500 [IU] via INTRAVENOUS
  Filled 2023-11-02: qty 5

## 2023-11-02 MED ORDER — OXYCODONE HCL 5 MG PO TABS
5.0000 mg | ORAL_TABLET | ORAL | 0 refills | Status: DC | PRN
  Filled 2023-11-02: qty 30, 5d supply, fill #0

## 2023-11-02 MED ORDER — LORAZEPAM 1 MG PO TABS
1.0000 mg | ORAL_TABLET | ORAL | 0 refills | Status: DC | PRN
  Filled 2023-11-02: qty 30, 5d supply, fill #0

## 2023-11-02 NOTE — Progress Notes (Signed)
 Nutrition Brief Note  Chart reviewed. Pt now transitioning to comfort care.  No further nutrition interventions planned at this time.    Tilda Franco, MS, RD, LDN Inpatient Clinical Dietitian Contact via Secure chat

## 2023-11-02 NOTE — Discharge Instructions (Signed)
 Please take all prescribed medications exactly as instructed. Please consume a regular diet as tolerated. Please increase your physical activity as tolerated. Please maintain all outpatient follow-up appointments. Please contact your hospice agency if you are experiencing worsening pain, shortness of breath, weakness or fever before coming to the emergency department.

## 2023-11-02 NOTE — Plan of Care (Signed)

## 2023-11-02 NOTE — Progress Notes (Signed)
 [facility] [phone] [facility] Liaison Note  Received request for hospice services after discharge. Spoke with patient and daughter, who was on speaker phone,  to initiate education related to hospice philosophy, services, and team approach to care. Patient/family verbalized understanding of information given. Discussed that at this time patient would not be approved for inpatient hospice car.   DME needs discussed. Patient has the a shower chair in the home. Patient/family requests the following equipment for delivery: bedside commode and walker. The address has been verified and is correct in the chart. Wynelle is the family contact to arrange time of equipment delivery.  Please send signed and completed DNR home with patient/family. Please provide prescriptions at discharge as needed to ensure ongoing symptom management.   [facility] information and contact numbers given to patient. Above information shared with Toy, Transitions of Care Manager. Please call with any questions or concerns.   Thank you for the opportunity to participate in this patient's care.   Elouise Husband BSN, Charity fundraiser, OCN [employer] [phone]

## 2023-11-02 NOTE — Assessment & Plan Note (Deleted)
 Elevated transaminases and bilirubin likely secondary to increasing tumor burden in the liver Gastroenterology originally consulted, their input is appreciated Now, supportive care as patient is comfort measures

## 2023-11-02 NOTE — Assessment & Plan Note (Deleted)
 Diagnosed 05/2020  Follows with Dr.  Gudena Most recently, patient has been treated with Herceptin  in the outpatient setting  CT imaging on this presentation reveals rapidly progressing disease with numerous liver metastasis with concurrent derangement of LFTs  [name] consulted and unfortunately there are no viable treatment options for this patient with rapidly progressive disease with poor prognosis Palliative Care Consulted.  Comfort measures initiated after a lengthy discussion about goals of care with patient, see comments below.

## 2023-11-02 NOTE — Progress Notes (Signed)
 Brief GI progress note: Patient seen by oncologist [name] yesterday, patient not responding to Herceptin  and Hospice was recommended as there are no treatment options she can tolerate. Patient to transition to comfort measures per documented discussion per Dr. Kenard 11/01/2023.  GI service will sign off at this time.  Please recontact our service if further questions concerns develop.

## 2023-11-02 NOTE — Progress Notes (Incomplete)
 PROGRESS NOTE   [name]  FMW:[id number] DOB: [birth year] DOA: 10/31/2023 PCP: [name] SAUNDERS, MD   Date of Service: the patient was seen and examined on 11/02/2023  Brief Narrative:  48 y.o. 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 PEA cardiac arrest during hospitalization 12/2022.     She is followed by oncologist [name] and wasstarted on Herceptin  targeted therapy 3 weeks ago.  Patient now presents to [facility] emergency department on 7/24 with progressively worsening abdominal pain.  Upon evaluation in the emergency department patient was found to have multiple SIRS criteria including leukocytosis and tachycardia with concurrent lactic acidosis.  CT imaging of the chest abdomen and pelvis was performed revealing rapidly progressing metastatic breast cancer with multiple hepatic metastases.  Patient was initiated on empiric intravenous antibiotics in the hospital [facility] was called to assess the patient for admission to the hospital.  Gastroenterology was initially consulted due to concurrent liver enzyme derangements/hyperbilirubinemia.  They initially had recommended they diagnostic and therapeutic paracentesis due to the patient's abdominal ascites.  [name] with oncology was additionally consulted considering patient's rapidly progressing malignancy.  Due to the patient's rapidly progressive disease and lack of response to Herceptin  he did not feel that there were any additional therapeutic options to explore for the patient's disease and instead recommended hospice care.  After lengthy discussion with the patient  surrounding goals of care, patient was transition to comfort measures on 11/01/2023.       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  Gudena Most recently, patient has been treated with Herceptin  in the outpatient setting  CT imaging on this presentation reveals rapidly progressing disease with numerous liver metastasis with concurrent derangement of LFTs  [name] consulted and unfortunately there are no viable treatment options for this patient with rapidly progressive disease with poor prognosis Palliative Care Consulted.  Comfort measures initiated after a lengthy discussion about goals of care with patient, see comments below. Sepsis due to Gram negative bacteria (HCC) Completion of this course of antibiotic treatment will likely contribute to patient's comfort Urinalysis and chest imaging revealed no evidence of source of infection Procalcitonin is markedly elevated highly suggestive of a bacterial infection Due to abdominal pain and significant ascites, suspicion is that patient is suffering from SBP Continuing ceftriaxone  at this time Paracentesis was originally ordered earlier today and was planned for tomorrow, will need to discuss whether patient still wishes to proceed now that she wishes to focus on comfort. Lactic acidosis Marked lactic acidosis that is persisting Likely secondary to volume depletion and underlying infection Treating underlying infection with antibiotics, will avoid aggressive intravenous volume resuscitation considering comfort measures. Abnormal LFTs Elevated transaminases and bilirubin likely secondary to increasing tumor burden in the liver Gastroenterology originally consulted, their input is appreciated Now, supportive care as patient is comfort measures Symptomatic  anemia Patient's hemoglobin significantly dropped this morning without any gross evidence of bleeding on exam Administering 1 unit packed red blood cells and fusion for comfort, after this transfusion will cease obtaining serial H&H's. Goals  of care, counseling/discussion Lengthy discussions had with the patient by both oncology and myself surrounding patient's extremely poor prognosis and rapidly progressing metastatic disease Oncology has shared with the patient that there are no therapeutic options that would be of benefit to the patient in treating her malignancy and therefore they have recommended hospice Patient states that she is tired of fighting and therefore wishes to transition to comfort measures Patient additionally confirms that she wishes to be DNR Orders of comfort measures and change in CODE STATUS ordered As needed opiate-based analgesics, benzodiazepines, glycopyrrolate , breathing treatments ordered. Message additionally sent to [facility] for assistance in identifying hospice options at discharge.     Subjective:  Patient is complaining of abdominal pain.  Pain is epigastric in location, mild to moderate in intensity, sore in quality.  Patient is complaining of associated generalized weakness.  Physical Exam:  Vitals:   11/01/23 2130 11/01/23 2139 11/01/23 2345 11/02/23 0548  BP: 126/87  137/84 131/81  Pulse: (!) 117  (!) 110 99  Resp: 20  18 18   Temp: 100.2 F (37.9 C) 99.8 F (37.7 C) 98.7 F (37.1 C) 98.4 F (36.9 C)  TempSrc: Oral  Oral   SpO2:   98% 100%  Weight:    54.3 kg  Height:        Constitutional: Awake alert and oriented x3, no associated distress.   Skin: no rashes, no lesions, good skin turgor noted. Eyes: Pupils are equally reactive to light.  No evidence of scleral icterus or conjunctival pallor.  ENMT: Moist mucous membranes noted.  Posterior pharynx clear of any exudate or lesions.   Respiratory: clear to auscultation  bilaterally, no wheezing, no crackles. Normal respiratory effort. No accessory muscle use.  Cardiovascular: Regular rate and rhythm, no murmurs / rubs / gallops. No extremity edema. 2+ pedal pulses. No carotid bruits.  Abdomen: Abdomen is protuberant with significant fullness in the right upper quadrant.  Abdomen is somewhat tender in the upper quadrants.  Positive bowel sounds noted in all quadrants.   Musculoskeletal: No joint deformity upper and lower extremities. Good ROM, no contractures. Normal muscle tone.    Data Reviewed:  I have personally reviewed and interpreted labs, imaging.  Significant findings are   CBC: Recent Labs  Lab 10/31/23 1758 11/01/23 0626 11/02/23 0329  WBC 18.3* 17.6*  --   NEUTROABS 14.6*  --   --   HGB 7.3* 6.3* 8.3*  HCT 23.4* 20.3* 25.5*  MCV 73.4* 73.3*  --   PLT 386 375  --    Basic Metabolic Panel: Recent Labs  Lab 10/31/23 1758 11/01/23 0626  NA 135 131*  K 4.2 4.1  CL 99 99  CO2 19* 22  GLUCOSE 86 53*  BUN 26* 19  CREATININE 0.87 0.71  CALCIUM  8.7* 8.0*  MG  --  2.0  PHOS  --  4.1   GFR: Estimated Creatinine Clearance: 73.5 mL/min (by C-G formula based on SCr of 0.71 mg/dL). Liver Function Tests: Recent Labs  Lab 10/31/23 1758 11/01/23 0626  AST 226* 191*  ALT 61* 52*  ALKPHOS 143* 124  BILITOT 6.6* 6.6*  PROT 7.9 6.8  ALBUMIN  2.2* 1.7*     Code Status:  DNR.  Code status decision has been confirmed with: patient    Severity of Illness:  The appropriate patient status for this patient is INPATIENT. Inpatient status is judged to be reasonable and necessary in order to provide the required intensity of service to ensure the patient's  safety. The patient's presenting symptoms, physical exam findings, and initial radiographic and laboratory data in the context of their chronic comorbidities is felt to place them at high risk for further clinical deterioration. Furthermore, it is not anticipated that the patient will be  medically stable for discharge from the hospital within 2 midnights of admission.   * I certify that at the point of admission it is my clinical judgment that the patient will require inpatient hospital care spanning beyond 2 midnights from the point of admission due to high intensity of service, high risk for further deterioration and high frequency of surveillance required.*  Time spent:  65 minutes  Author:  Zachary JINNY Ba MD  11/02/2023 8:32 AM

## 2023-11-02 NOTE — Assessment & Plan Note (Deleted)
 Marked lactic acidosis that is persisting Likely secondary to volume depletion and underlying infection Treating underlying infection with antibiotics, will avoid aggressive intravenous volume resuscitation considering comfort measures.

## 2023-11-02 NOTE — Assessment & Plan Note (Deleted)
 Completion of this course of antibiotic treatment will likely contribute to patient's comfort Urinalysis and chest imaging revealed no evidence of source of infection Procalcitonin is markedly elevated highly suggestive of a bacterial infection Due to abdominal pain and significant ascites, suspicion is that patient is suffering from SBP Continuing ceftriaxone  at this time Paracentesis was originally ordered earlier today and was planned for tomorrow, will need to discuss whether patient still wishes to proceed now that she wishes to focus on comfort.

## 2023-11-02 NOTE — Progress Notes (Signed)
 Interventional Radiology Brief Note:  IR consulted for paracentesis due to abdominal distention.  Limited Abd US  shows trace amount of left sided fluid which is not amenable to paracentesis.  No procedure performed.   Karlton Maya, MS RD PA-C

## 2023-11-02 NOTE — Plan of Care (Signed)
   Problem: Education: Goal: Knowledge of General Education information will improve Description Including pain rating scale, medication(s)/side effects and non-pharmacologic comfort measures Outcome: Progressing

## 2023-11-02 NOTE — Assessment & Plan Note (Deleted)
 Lengthy discussions had with the patient by both oncology and myself surrounding patient's extremely poor prognosis and rapidly progressing metastatic disease Oncology has shared with the patient that there are no therapeutic options that would be of benefit to the patient in treating her malignancy and therefore they have recommended hospice Patient states that she is tired of fighting and therefore wishes to transition to comfort measures Patient additionally confirms that she wishes to be DNR Orders of comfort measures and change in CODE STATUS ordered As needed opiate-based analgesics, benzodiazepines, glycopyrrolate , breathing treatments ordered. Message additionally sent to [facility] for assistance in identifying hospice options at discharge.

## 2023-11-02 NOTE — Assessment & Plan Note (Deleted)
 Patient's hemoglobin significantly dropped this morning without any gross evidence of bleeding on exam Administering 1 unit packed red blood cells and fusion for comfort, after this transfusion will cease obtaining serial H&H's.

## 2023-11-02 NOTE — TOC Initial Note (Signed)
 Transition of Care [facility]) - Initial/Assessment Note    Patient Details  Name: [name] MRN: [id number] Date of Birth: [birth year]  Transition of [facility]) CM/SW Contact:    Toy LITTIE Agar, RN Phone Number:[phone]  11/02/2023, 10:59 AM  Clinical Narrative:                 [facility] consulted for Inpatient hospice assessment evaluation. CM at bedside to offer choice. Patient wishes to establish hospice services with [facility]. Referral has been called to [facility] hospital liaison with [facility]. TOC will continue to follow.     Barriers to Discharge: Continued Medical Work up, No Barriers Identified   Patient Goals and CMS Choice Patient states their goals for this hospitalization and ongoing recovery are:: To be evaluated for Inpatient hospice CMS Medicare.gov Compare Post Acute Care list provided to:: Patient Choice offered to / list presented to : Patient [place] ownership interest in [facility].provided to::  (n/a)    Expected Discharge Plan and Services In-house Referral: NA Discharge Planning Services: CM Consult Post Acute Care Choice: Hospice Living arrangements for the past 2 months: Single Family Home                 DME Arranged: N/A DME Agency: NA       HH Arranged: NA HH Agency: NA        Prior Living Arrangements/Services Living arrangements for the past 2 months: Single Family Home Lives with:: Self Patient language and need for interpreter reviewed:: Yes Do you feel safe going back to the place where you live?: Yes      Need for Family Participation in Patient Care: Yes (Comment) Care giver support system in place?: Yes (comment)   Criminal Activity/Legal Involvement Pertinent to Current Situation/Hospitalization: No - Comment as needed  Activities of Daily Living   ADL Screening (condition at time of admission) Independently performs ADLs?: Yes (appropriate for developmental age) Is the patient deaf or have difficulty hearing?:  No Does the patient have difficulty seeing, even when wearing glasses/contacts?: No Does the patient have difficulty concentrating, remembering, or making decisions?: No  Permission Sought/Granted Permission sought to share information with : Facility Industrial/product designer granted to share information with : Yes, Verbal Permission Granted  Share Information with NAME: Elouise Husband  Permission granted to share info w AGENCY: [facility]  Permission granted to share info w Relationship: Hospice referral     Emotional Assessment Appearance:: Appears stated age Attitude/Demeanor/Rapport: Gracious Affect (typically observed): Flat Orientation: : Oriented to Self, Oriented to Place, Oriented to  Time, Oriented to Situation Alcohol / Substance Use: Not Applicable Psych Involvement: No (comment)  Admission diagnosis:  SIRS (systemic inflammatory response syndrome) (HCC) [R65.10] Breast cancer metastasized to liver, right (HCC) [C50.911, C78.7] Metastatic malignant neoplasm, unspecified site [facility]) [C79.9] Patient Active Problem List   Diagnosis Date Noted   Breast cancer metastasized to liver, right (HCC) 11/01/2023   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 (HCC) 11/01/2023   Abnormal LFTs 11/01/2023   Lactic acidosis 11/01/2023   Sepsis due to Gram negative bacteria (HCC) 11/01/2023   Symptomatic anemia 11/01/2023   Protein-calorie malnutrition, moderate (HCC) 05/14/2023   Hypotension 05/10/2023   On total parenteral nutrition (TPN) 05/10/2023   Influenza A 05/10/2023   Positive blood culture 05/10/2023   Crohn's disease (HCC) 05/10/2023   H/O abdominal abscess 05/10/2023   Weight loss 05/10/2023   Irritant contact dermatitis associated with fecal stoma 02/20/2023   Ileostomy care (HCC)  02/20/2023   Acute pulmonary embolism without acute cor pulmonale (HCC) 12/29/2022   Fever, unspecified 12/28/2022   Palliative care by specialist 12/27/2022   Abdominal  pain 12/27/2022   Malnutrition of moderate degree 12/24/2022   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 12/20/2022   SBO with strangulation s/p resection 12/14/2022 12/20/2022   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   Septic shock (HCC) 12/14/2022   Cardiac arrest (HCC) 12/13/2022   Chronic disease anemia 11/19/2022   Protein-calorie malnutrition, severe (HCC) 11/19/2022   Failure to thrive in adult 11/19/2022   Anorexia 11/19/2022   Nausea 11/19/2022   Leukocytosis 05/27/2021   Crohn's disease of both small and large intestine with intestinal obstruction (HCC)    Breast cancer (HCC) 11/09/2020   Port-A-Cath in place 07/15/2020   Hypokalemia 06/30/2020   Goals of care, counseling/discussion 06/25/2020   Family history of breast cancer 06/11/2020   Malignant neoplasm of upper-outer quadrant of right breast in female, estrogen receptor positive (HCC) 06/04/2020   Abnormal finding present on diagnostic imaging of uterus 05/07/2019   Generalized abdominal pain    Vitamin D  deficiency 09/24/2013   Iron  deficiency anemia secondary to blood loss (chronic) - Crohn's colitis 10/17/2010   PCP:  [name], MD Pharmacy:   [facility] [id number] - [place],  - [address] AT [facility] [address] [place] [address] Phone: [phone] Fax: [phone]     Social Drivers of Health (SDOH) Social History: SDOH Screenings   Food Insecurity: No Food Insecurity (11/01/2023)  Housing: Low Risk  (11/01/2023)  Transportation Needs: No Transportation Needs (11/01/2023)  Utilities: Not At Risk (11/01/2023)  Financial Resource Strain: Low Risk  (10/15/2023)   Received from [facility]  Physical Activity: Insufficiently Active (10/15/2023)   Received from [facility]  Social Connections: Unknown (10/15/2023)   Received from [facility]  Stress: No Stress Concern Present (10/15/2023)   Received from [facility]  Tobacco Use: Medium Risk (10/31/2023)  Health Literacy: Adequate Health Literacy (10/15/2023)   Received from [facility]   SDOH Interventions:     Readmission Risk Interventions    11/02/2023   10:35 AM 09/04/2023    9:56 AM 05/13/2023   12:48 PM  Readmission Risk Prevention Plan  Transportation Screening Complete Complete Complete  PCP or Specialist Appt within 3-5 Days  Complete   HRI or Home Care Consult  Complete   Social Work Consult for Recovery Care Planning/Counseling  Complete   Palliative Care Screening  Complete   Medication Review [employer]) Complete Complete Complete  PCP or Specialist appointment within 3-5 days of discharge Complete    HRI or Home Care Consult Complete  Complete  SW Recovery Care/Counseling Consult Complete  Complete  Palliative Care Screening Complete  Not Applicable  Skilled Nursing Facility Not Applicable  Not Applicable

## 2023-11-03 LAB — CULTURE, BLOOD (ROUTINE X 2)

## 2023-11-03 NOTE — Discharge Summary (Signed)
 Physician Discharge Summary   Patient: [name] MRN: [id number] DOB: Feb 05, 1976  Admit date:     10/31/2023  Discharge date: 11/02/2023  Discharge Physician: Zachary JINNY Ba   PCP: [name], MD   Recommendations at discharge:   Please take all prescribed medications exactly as instructed. Please consume a regular diet as tolerated. Please increase your physical activity as tolerated. Please maintain all outpatient follow-up appointments. Please contact your hospice agency if you are experiencing worsening pain, shortness of breath, weakness or fever before coming to the emergency department.  Discharge Diagnoses: Principal Problem:   Breast cancer metastasized to liver, right 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 *   Hospital Course: 48 y.o. 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 PEA cardiac arrest during hospitalization 12/2022.     She is followed by oncologist [name] and wasstarted on Herceptin  targeted therapy 3 weeks ago.  Patient now presents to [facility] emergency department on 7/24 with progressively worsening abdominal pain.  Upon evaluation in the emergency department patient was found to have multiple  SIRS criteria including leukocytosis and tachycardia with concurrent lactic acidosis.  CT imaging of the chest abdomen and pelvis was performed revealing rapidly progressing metastatic breast cancer with multiple hepatic metastases.  Patient was initiated on empiric intravenous antibiotics in the hospital [facility] was called to assess the patient for admission to the hospital.  Gastroenterology was initially consulted due to concurrent liver enzyme derangements/hyperbilirubinemia.  They initially had recommended they diagnostic and therapeutic paracentesis due to the patient's abdominal ascites.  IR consultation was obtained to proceed with paracentesis but felt that there was not enough fluid to remove.  [name] with oncology was additionally consulted considering patient's rapidly progressing malignancy.  Due to the patient's rapidly progressive disease and lack of response to Herceptin  he did not feel that there were any additional therapeutic options to explore for the patient's disease and instead recommended hospice care.  After lengthy discussion with the patient surrounding goals of care, patient was transition to comfort measures on 11/01/2023.  Patient was discharged home with home hospice services on 11/02/2023.       Consultants: [name] with Oncology, Dr. Burnette with Gastroenterology  Procedures performed: none  Disposition: Hospice care Diet recommendation:  Discharge Diet Orders (From admission, onward)     Start     Ordered   11/02/23 0000  Diet general        11/02/23 1605           Regular diet  DISCHARGE MEDICATION: Allergies as of 11/02/2023       Reactions   Nsaids Other (See Comments)   Crohns disease/ IBD   Chlorhexidine  Gluconate Itching   Able to use CHG swabs for ports, reaction with direct skin contact   Hydromorphone  Other (  See Comments)   Confusion/sedation   Lactose Other (See Comments)   Upset stomach/diarrhea   Lactose Intolerance (gi) Other  (See Comments)   Upset stomach/diarrhea   Vancomycin  Itching   Red Man's Syndrome        Medication List     STOP taking these medications    Eliquis  5 MG Tabs tablet Generic drug: apixaban    fludrocortisone  0.1 MG tablet Commonly known as: FLORINEF    IRON -VITAMIN C  PO   levofloxacin  750 MG tablet Commonly known as: LEVAQUIN    midodrine  10 MG tablet Commonly known as: PROAMATINE    multivitamin with minerals tablet   sterile water SOLN with amino acids  10 % SOLN 1.3 g/kg, dextrose  70 % SOLN 20 %       TAKE these medications    acetaminophen  500 MG tablet Commonly known as: TYLENOL  Take 500 mg by mouth daily as needed for mild pain (pain score 1-3) or moderate pain (pain score 4-6).   cefdinir  300 MG capsule Commonly known as: OMNICEF  Take 1 capsule (300 mg total) by mouth 2 (two) times daily for 5 days.   loperamide  2 MG capsule Commonly known as: IMODIUM  Take 1 capsule (2 mg total) by mouth 4 (four) times daily as needed for diarrhea or loose stools.   LORazepam  1 MG tablet Commonly known as: ATIVAN  Take 1 tablet (1 mg total) by mouth every 4 (four) hours as needed for anxiety.   oxyCODONE  5 MG immediate release tablet Commonly known as: Roxicodone  Take 1 tablet (5 mg total) by mouth every 4 (four) hours as needed for severe pain (pain score 7-10) or moderate pain (pain score 4-6).        Follow-up Information     [facility] up.   Contact information: [address] [facility] [place]  [address] [phone]                Discharge Exam: Filed Weights   11/01/23 0336 11/01/23 0418 11/02/23 0548  Weight: 57.4 kg 54.3 kg 54.3 kg    Constitutional: Awake alert and oriented x3, no associated distress.   Respiratory: clear to auscultation bilaterally, no wheezing, no crackles. Normal respiratory effort. No accessory muscle use.  Cardiovascular: Regular rate and rhythm, no murmurs / rubs / gallops. No extremity  edema. 2+ pedal pulses. No carotid bruits.  Abdomen: Abdomen is soft with mild tenderness. No evidence of intra-abdominal masses.  Positive bowel sounds noted in all quadrants.   Musculoskeletal: No joint deformity upper and lower extremities. Good ROM, no contractures. Normal muscle tone.     Condition at discharge: fair  The results of significant diagnostics from this hospitalization (including imaging, microbiology, ancillary and laboratory) are listed below for reference.   Imaging Studies: US  ASCITES (ABDOMEN LIMITED) Result Date: 11/02/2023 CLINICAL DATA:  48 year old with metastatic breast cancer, abdominal distention. Paracentesis requested. EXAM: LIMITED ABDOMEN ULTRASOUND FOR ASCITES TECHNIQUE: Limited ultrasound survey for ascites was performed in all four abdominal quadrants. COMPARISON:  None Available. FINDINGS: Limited abdominal ultrasound shows trace amount of fluid in the left abdomen not amenable to paracentesis. IMPRESSION: Trace amount of ascites insufficient for paracentesis. No procedure performed. Performed by: Kacie Matthews PA-C Electronically Signed   By: Juliene Balder M.D.   On: 11/02/2023 16:17   MR ABDOMEN MRCP W WO CONTAST Result Date: 11/01/2023 CLINICAL DATA:  Metastatic breast cancer to the liver EXAM: MRI ABDOMEN WITHOUT AND WITH CONTRAST (INCLUDING MRCP) TECHNIQUE: Multiplanar multisequence MR imaging of the abdomen was performed both  before and after the administration of intravenous contrast. Heavily T2-weighted images of the biliary and pancreatic ducts were obtained, and three-dimensional MRCP images were rendered by post processing. CONTRAST:  5mL GADAVIST  GADOBUTROL  1 MMOL/ML IV SOLN COMPARISON:  CT abdomen 11/01/2023 FINDINGS: Lower chest: Right pleural effusion. Subpleural opacity anteriorly in the right upper lobe probably from prior radiation therapy, included on the coronal images. Linear subsegmental atelectasis or scarring in the posterior basal segment left  lower lobe. Left breast implant. Right mastectomy. Hepatobiliary: Extensive tumor involvement of all segments of the liver with heavy tumor burden especially with geographic involvement throughout the left hepatic lobe, and innumerable metastatic foci in the right hepatic lobe. In general these have reduced T1 and accentuated T2 signal characteristics abnormal hypoenhancement. Individual lesions are typically targetoid in their enhancement pattern, such as the 1.8 cm right hepatic lobe lesion medially on image 37 series 19. Overall the metastatic progression appears to be rapidly progressive with absence of hepatic involvement on 05/09/2023; few metastatic lesions on 08/28/2023; and extensive an heavy burden metastatic disease throughout the liver today. There is substantial narrowing of the portal vein at the confluence of the SMV splenic vein similar to the CT scan obtained today, with anticlockwise swirled appearance of mesenteric vasculature in this vicinity (which can be a normal variant). The gallbladder is contracted. No discrete intrahepatic or extrahepatic biliary dilatation. The small caliber biliary tree is difficult to visualize on the MRCP images due to motion artifact and small caliber. Pancreas:  Unremarkable Spleen:  Unremarkable Adrenals/Urinary Tract: Benign cyst of the left mid to lower kidney warrants no imaging follow up. Adrenal glands unremarkable. Stomach/Bowel: Hartmann's pouch noted.  Left enterostomy noted. Vascular/Lymphatic: Substantial narrowing of the portal vein in the porta hepatis and at the confluence of the splenic vein and SMV. The SMV is slightly narrowed but patent as it swirls around the superior mesenteric artery in an anti clockwise manner. Portacaval node 1.2 cm in short axis on image 51 series 5. Other:  Substantial ascites, cannot exclude loculated components. Musculoskeletal: Unremarkable IMPRESSION: 1. Extensive tumor involvement of all segments of the liver with heavy  tumor burden especially with geographic involvement throughout the left hepatic lobe, and innumerable metastatic foci in the right hepatic lobe. Overall the metastatic progression appears to be rapidly progressive with absence of hepatic involvement on 05/09/2023; few metastatic lesions on 08/28/2023; and extensive an heavy burden metastatic disease throughout the liver today. 2. No biliary dilatation. 3. Substantial narrowing of the portal vein in the porta hepatis and at the confluence of the splenic vein and SMV, likely extrinsic. The SMV is slightly narrowed but patent as it swirls around the superior mesenteric artery in an anti clockwise manner (which can be a normal variant appearance). 4. Substantial ascites, cannot exclude loculated components. 5. Right pleural effusion. 6. Subpleural opacity anteriorly in the right upper lobe probably from prior radiation therapy. 7. Left enterostomy. 8. Portacaval node 1.2 cm in short axis. Electronically Signed   By: Ryan Salvage M.D.   On: 11/01/2023 12:52   MR 3D Recon At Scanner Result Date: 11/01/2023 CLINICAL DATA:  Metastatic breast cancer to the liver EXAM: MRI ABDOMEN WITHOUT AND WITH CONTRAST (INCLUDING MRCP) TECHNIQUE: Multiplanar multisequence MR imaging of the abdomen was performed both before and after the administration of intravenous contrast. Heavily T2-weighted images of the biliary and pancreatic ducts were obtained, and three-dimensional MRCP images were rendered by post processing. CONTRAST:  5mL GADAVIST  GADOBUTROL  1 MMOL/ML IV SOLN COMPARISON:  CT abdomen 11/01/2023 FINDINGS: Lower chest: Right pleural effusion. Subpleural opacity anteriorly in the right upper lobe probably from prior radiation therapy, included on the coronal images. Linear subsegmental atelectasis or scarring in the posterior basal segment left lower lobe. Left breast implant. Right mastectomy. Hepatobiliary: Extensive tumor involvement of all segments of the liver with  heavy tumor burden especially with geographic involvement throughout the left hepatic lobe, and innumerable metastatic foci in the right hepatic lobe. In general these have reduced T1 and accentuated T2 signal characteristics abnormal hypoenhancement. Individual lesions are typically targetoid in their enhancement pattern, such as the 1.8 cm right hepatic lobe lesion medially on image 37 series 19. Overall the metastatic progression appears to be rapidly progressive with absence of hepatic involvement on 05/09/2023; few metastatic lesions on 08/28/2023; and extensive an heavy burden metastatic disease throughout the liver today. There is substantial narrowing of the portal vein at the confluence of the SMV splenic vein similar to the CT scan obtained today, with anticlockwise swirled appearance of mesenteric vasculature in this vicinity (which can be a normal variant). The gallbladder is contracted. No discrete intrahepatic or extrahepatic biliary dilatation. The small caliber biliary tree is difficult to visualize on the MRCP images due to motion artifact and small caliber. Pancreas:  Unremarkable Spleen:  Unremarkable Adrenals/Urinary Tract: Benign cyst of the left mid to lower kidney warrants no imaging follow up. Adrenal glands unremarkable. Stomach/Bowel: Hartmann's pouch noted.  Left enterostomy noted. Vascular/Lymphatic: Substantial narrowing of the portal vein in the porta hepatis and at the confluence of the splenic vein and SMV. The SMV is slightly narrowed but patent as it swirls around the superior mesenteric artery in an anti clockwise manner. Portacaval node 1.2 cm in short axis on image 51 series 5. Other:  Substantial ascites, cannot exclude loculated components. Musculoskeletal: Unremarkable IMPRESSION: 1. Extensive tumor involvement of all segments of the liver with heavy tumor burden especially with geographic involvement throughout the left hepatic lobe, and innumerable metastatic foci in the right  hepatic lobe. Overall the metastatic progression appears to be rapidly progressive with absence of hepatic involvement on 05/09/2023; few metastatic lesions on 08/28/2023; and extensive an heavy burden metastatic disease throughout the liver today. 2. No biliary dilatation. 3. Substantial narrowing of the portal vein in the porta hepatis and at the confluence of the splenic vein and SMV, likely extrinsic. The SMV is slightly narrowed but patent as it swirls around the superior mesenteric artery in an anti clockwise manner (which can be a normal variant appearance). 4. Substantial ascites, cannot exclude loculated components. 5. Right pleural effusion. 6. Subpleural opacity anteriorly in the right upper lobe probably from prior radiation therapy. 7. Left enterostomy. 8. Portacaval node 1.2 cm in short axis. Electronically Signed   By: Ryan Salvage M.D.   On: 11/01/2023 12:52   CT Angio Chest PE W and/or Wo Contrast Result Date: 11/01/2023 EXAM: CTA CHEST PE WITH CONTRAST CT ABDOMEN AND PELVIS WITH CONTRAST 11/01/2023 01:11:08 AM TECHNIQUE: CTA of the chest was performed after the administration of intravenous contrast. Multiplanar reformatted images are provided for review. MIP images are provided for review. CT of the abdomen and pelvis was performed with the administration of intravenous contrast. Automated exposure control, iterative reconstruction, and/or weight based adjustment of the mA/kV was utilized to reduce the radiation dose to as low as reasonably achievable. COMPARISON: CT chest / abdomen / pelvis dated 09/02/2023 and CT abdomen dated 08/28/2023. CLINICAL HISTORY: Biliary obstruction suspected (Ped 0-17y). Using TPN for  nutrition and has been unable to eat and hydrate as normal. Pt is having RUQ pain and was told that her white count was elevated. FINDINGS: CHEST: PULMONARY ARTERIES: Pulmonary arteries are adequately opacified for evaluation. No intraluminal filling defect to suggest pulmonary  embolism. Main pulmonary artery is normal in caliber. MEDIASTINUM: No mediastinal lymphadenopathy. The heart and pericardium demonstrate no acute abnormality. There is no acute abnormality of the thoracic aorta. LUNGS AND PLEURA: Mild compressive atelectasis in the right lower lobe. Mild left basilar scarring. Prior left lower lobe pneumonia has resolved. Small right pleural effusion, new. SOFT TISSUES AND BONES: Status post right mastectomy. Left breast augmentation. Radiation changes in the anterior right hemithorax. ABDOMEN AND PELVIS: LIVER: Innumerable hepatic metastases in the right hepatic lobe, many of which are new, with a dominant 5.5 cm right hepatic lesion (image 24) which previously measured 3.4 cm. 3.7 cm central left hepatic lobe lesion (image 17), previously 4.7 cm, with decreased perfusion of the majority of the left hepatic lobe suggesting posttreatment changes. GALLBLADDER AND BILE DUCTS: Gallbladder is unremarkable. No biliary ductal dilatation. SPLEEN: Spleen demonstrates no acute abnormality. PANCREAS: Pancreas demonstrates no acute abnormality. ADRENAL GLANDS: Adrenal glands demonstrate no acute abnormality. KIDNEYS, URETERS AND BLADDER: No stones in the kidneys or ureters. No hydronephrosis. No perinephric or periureteral stranding. Urinary bladder is underdistended but unremarkable. GI AND BOWEL: Status post subtotal colectomy with left lower quadrant ileostomy and hartmann's pouch. No abnormal bowel wall thickening or distension. REPRODUCTIVE: Uterine fibroids. PERITONEUM AND RETROPERITONEUM: Moderate abdominopelvic ascites, new, including loculated fluid beneath the left anterior abdominal wall (image 44). LYMPH NODES: No lymphadenopathy. BONES AND SOFT TISSUES: No acute abnormality of the visualized bones. No focal soft tissue abnormality. IMPRESSION: 1. No evidence of pulmonary embolism. 2. Small right pleural effusion, new. Prior left lower lobe pneumonia has resolved. 3. Progressive  hepatic metastases with post-treatment changes in the left liver, as above. 4. Moderate abdominopelvic ascites, new, partially loculated. 5. Status post right mastectomy. Radiation changes in the anterior right hemithorax. Electronically signed by: Pinkie Pebbles MD 11/01/2023 01:31 AM EDT RP Workstation: [id number]   CT ABDOMEN PELVIS W CONTRAST Result Date: 11/01/2023 EXAM: CTA CHEST PE WITH CONTRAST CT ABDOMEN AND PELVIS WITH CONTRAST 11/01/2023 01:11:08 AM TECHNIQUE: CTA of the chest was performed after the administration of intravenous contrast. Multiplanar reformatted images are provided for review. MIP images are provided for review. CT of the abdomen and pelvis was performed with the administration of intravenous contrast. Automated exposure control, iterative reconstruction, and/or weight based adjustment of the mA/kV was utilized to reduce the radiation dose to as low as reasonably achievable. COMPARISON: CT chest / abdomen / pelvis dated 09/02/2023 and CT abdomen dated 08/28/2023. CLINICAL HISTORY: Biliary obstruction suspected (Ped 0-17y). Using TPN for nutrition and has been unable to eat and hydrate as normal. Pt is having RUQ pain and was told that her white count was elevated. FINDINGS: CHEST: PULMONARY ARTERIES: Pulmonary arteries are adequately opacified for evaluation. No intraluminal filling defect to suggest pulmonary embolism. Main pulmonary artery is normal in caliber. MEDIASTINUM: No mediastinal lymphadenopathy. The heart and pericardium demonstrate no acute abnormality. There is no acute abnormality of the thoracic aorta. LUNGS AND PLEURA: Mild compressive atelectasis in the right lower lobe. Mild left basilar scarring. Prior left lower lobe pneumonia has resolved. Small right pleural effusion, new. SOFT TISSUES AND BONES: Status post right mastectomy. Left breast augmentation. Radiation changes in the anterior right hemithorax. ABDOMEN AND PELVIS: LIVER: Innumerable hepatic metastases in  the right hepatic lobe, many of which are new, with a dominant 5.5 cm right hepatic lesion (image 24) which previously measured 3.4 cm. 3.7 cm central left hepatic lobe lesion (image 17), previously 4.7 cm, with decreased perfusion of the majority of the left hepatic lobe suggesting posttreatment changes. GALLBLADDER AND BILE DUCTS: Gallbladder is unremarkable. No biliary ductal dilatation. SPLEEN: Spleen demonstrates no acute abnormality. PANCREAS: Pancreas demonstrates no acute abnormality. ADRENAL GLANDS: Adrenal glands demonstrate no acute abnormality. KIDNEYS, URETERS AND BLADDER: No stones in the kidneys or ureters. No hydronephrosis. No perinephric or periureteral stranding. Urinary bladder is underdistended but unremarkable. GI AND BOWEL: Status post subtotal colectomy with left lower quadrant ileostomy and hartmann's pouch. No abnormal bowel wall thickening or distension. REPRODUCTIVE: Uterine fibroids. PERITONEUM AND RETROPERITONEUM: Moderate abdominopelvic ascites, new, including loculated fluid beneath the left anterior abdominal wall (image 44). LYMPH NODES: No lymphadenopathy. BONES AND SOFT TISSUES: No acute abnormality of the visualized bones. No focal soft tissue abnormality. IMPRESSION: 1. No evidence of pulmonary embolism. 2. Small right pleural effusion, new. Prior left lower lobe pneumonia has resolved. 3. Progressive hepatic metastases with post-treatment changes in the left liver, as above. 4. Moderate abdominopelvic ascites, new, partially loculated. 5. Status post right mastectomy. Radiation changes in the anterior right hemithorax. Electronically signed by: Pinkie Pebbles MD 11/01/2023 01:31 AM EDT RP Workstation: [id number]   ECHOCARDIOGRAM COMPLETE Result Date: 10/08/2023    ECHOCARDIOGRAM REPORT   Patient Name:   MERIKAY LESNIEWSKI Date of Exam: 10/08/2023 Medical Rec #:  [id number]        Height:       64.0 in Accession #:    [id number]       Weight:       124.8 lb Date of Birth:   09/10/1975        BSA:          1.601 m Patient Age:    47 years         BP:           97/65 mmHg Patient Gender: F                HR:           81 bpm. Exam Location:  Outpatient Procedure: 2D Echo, Cardiac Doppler and Color Doppler (Both Spectral and Color            Flow Doppler were utilized during procedure). Indications:    Chemo Z09  History:        Patient has prior history of Echocardiogram examinations, most                 recent 09/18/2022.  Sonographer:    Jayson Gaskins Referring Phys: [id number] MACKEY CHAD  Sonographer Comments: No apical window. Image acquisition challenging due to breast implants. IMPRESSIONS  1. Left ventricular ejection fraction, by estimation, is 55 to 60%. The left ventricle has normal function. Left ventricular endocardial border not optimally defined to evaluate regional wall motion. Left ventricular diastolic parameters are indeterminate.  2. Right ventricular systolic function is normal. The right ventricular size is normal. Tricuspid regurgitation signal is inadequate for assessing PA pressure.  3. The mitral valve was not well visualized. No evidence of mitral valve regurgitation. No evidence of mitral stenosis.  4. The aortic valve was not well visualized. Aortic valve regurgitation is not visualized.  5. The inferior vena cava is normal in size with greater than 50% respiratory variability, suggesting right atrial pressure of 3  mmHg.  6. Technically difficult study with very poor images. LV systolic function grossly normal. FINDINGS  Left Ventricle: Left ventricular ejection fraction, by estimation, is 55 to 60%. The left ventricle has normal function. Left ventricular endocardial border not optimally defined to evaluate regional wall motion. The left ventricular internal cavity size was normal in size. There is no left ventricular hypertrophy. Left ventricular diastolic parameters are indeterminate. Right Ventricle: The right ventricular size is normal. No increase in right  ventricular wall thickness. Right ventricular systolic function is normal. Tricuspid regurgitation signal is inadequate for assessing PA pressure. Left Atrium: Left atrial size was normal in size. Right Atrium: Right atrial size was normal in size. Pericardium: Trivial pericardial effusion is present. Mitral Valve: The mitral valve was not well visualized. No evidence of mitral valve regurgitation. No evidence of mitral valve stenosis. Tricuspid Valve: The tricuspid valve is not well visualized. Tricuspid valve regurgitation is not demonstrated. Aortic Valve: The aortic valve was not well visualized. Aortic valve regurgitation is not visualized. Pulmonic Valve: The pulmonic valve was not well visualized. Pulmonic valve regurgitation is not visualized. Aorta: The aortic root is normal in size and structure. Venous: The inferior vena cava is normal in size with greater than 50% respiratory variability, suggesting right atrial pressure of 3 mmHg. IAS/Shunts: No atrial level shunt detected by color flow Doppler.  LEFT VENTRICLE PLAX 2D LVIDd:         3.80 cm LVIDs:         2.60 cm LV PW:         1.00 cm LV IVS:        0.70 cm LVOT diam:     2.00 cm LVOT Area:     3.14 cm   SHUNTS Systemic Diam: 2.00 cm Dalton McleanMD Electronically signed by Ezra Kanner Signature Date/Time: 10/08/2023/1:49:28 PM    Final     Microbiology: Results for orders placed or performed during the hospital encounter of 10/31/23  Blood culture (routine x 2)     Status: None (Preliminary result)   Collection Time: 10/31/23  2:07 AM   Specimen: BLOOD LEFT FOREARM  Result Value Ref Range Status   Specimen Description   Final    BLOOD LEFT FOREARM Performed at [facility], [address] [place], [place] [address]    Special Requests   Final    BOTTLES DRAWN AEROBIC AND ANAEROBIC Blood Culture results may not be optimal due to an inadequate volume of blood received in culture bottles Performed at [facility], [address], [place], [place] [address]    Culture   Final    NO GROWTH 1 DAY Performed at [facility], [address], [place], [place] [address]    Report Status PENDING  Incomplete  Urine Culture     Status: Abnormal   Collection Time: 10/31/23  9:19 PM   Specimen: Urine, Clean Catch  Result Value Ref Range Status   Specimen Description   Final    URINE, CLEAN CATCH Performed at [facility], [address] [place], [place] [address]    Special Requests   Final    NONE Performed at [facility], [address] [place], [place] [address]    Culture (A)  Final    20,000 COLONIES/mL MULTIPLE SPECIES PRESENT, SUGGEST RECOLLECTION   Report Status 11/02/2023 FINAL  Final  Blood culture (routine x 2)     Status: Abnormal (Preliminary result)  Collection Time: 10/31/23 10:30 PM   Specimen: BLOOD RIGHT ARM  Result Value Ref Range Status   Specimen Description   Final    BLOOD RIGHT ARM Performed at [facility], [address] [place], [place] [address]    Special Requests   Final    BOTTLES DRAWN AEROBIC AND ANAEROBIC Blood Culture results may not be optimal due to an inadequate volume of blood received in culture bottles Performed at [facility], [address] [place], [place] [address]    Culture  Setup Time   Final    GRAM POSITIVE COCCI IN BOTH AEROBIC AND ANAEROBIC BOTTLES CRITICAL RESULT CALLED TO, READ BACK BY AND VERIFIED WITH: PHARMD ASABRA BOLOGNA [phone] @ 1547 FH    Culture (A)  Final    STAPHYLOCOCCUS EPIDERMIDIS THE SIGNIFICANCE OF ISOLATING THIS ORGANISM FROM A SINGLE SET OF BLOOD CULTURES WHEN MULTIPLE SETS ARE DRAWN IS UNCERTAIN. PLEASE NOTIFY THE MICROBIOLOGY DEPARTMENT WITHIN ONE WEEK IF SPECIATION AND SENSITIVITIES ARE REQUIRED. Performed at [facility], [address], [place], [place] [address]    Report Status PENDING  Incomplete  Blood Culture ID Panel  (Reflexed)     Status: Abnormal   Collection Time: 10/31/23 10:30 PM  Result Value Ref Range Status   Enterococcus faecalis NOT DETECTED NOT DETECTED Final   Enterococcus Faecium NOT DETECTED NOT DETECTED Final   Listeria monocytogenes NOT DETECTED NOT DETECTED Final   Staphylococcus species DETECTED (A) NOT DETECTED Final    Comment: CRITICAL RESULT CALLED TO, READ BACK BY AND VERIFIED WITH: PHARMD ASABRA BOLOGNA [phone] @ 1547 FH    Staphylococcus aureus (BCID) NOT DETECTED NOT DETECTED Final   Staphylococcus epidermidis DETECTED (A) NOT DETECTED Final    Comment: CRITICAL RESULT CALLED TO, READ BACK BY AND VERIFIED WITH: PHARMD ASABRA BOLOGNA [id number] @ 1547 FH    Staphylococcus lugdunensis NOT DETECTED NOT DETECTED Final   Streptococcus species NOT DETECTED NOT DETECTED Final   Streptococcus agalactiae NOT DETECTED NOT DETECTED Final   Streptococcus pneumoniae NOT DETECTED NOT DETECTED Final   Streptococcus pyogenes NOT DETECTED NOT DETECTED Final   A.calcoaceticus-baumannii NOT DETECTED NOT DETECTED Final   Bacteroides fragilis NOT DETECTED NOT DETECTED Final   Enterobacterales NOT DETECTED NOT DETECTED Final   Enterobacter cloacae complex NOT DETECTED NOT DETECTED Final   Escherichia coli NOT DETECTED NOT DETECTED Final   Klebsiella aerogenes NOT DETECTED NOT DETECTED Final   Klebsiella oxytoca NOT DETECTED NOT DETECTED Final   Klebsiella pneumoniae NOT DETECTED NOT DETECTED Final   Proteus species NOT DETECTED NOT DETECTED Final   Salmonella species NOT DETECTED NOT DETECTED Final   Serratia marcescens NOT DETECTED NOT DETECTED Final   Haemophilus influenzae NOT DETECTED NOT DETECTED Final   Neisseria meningitidis NOT DETECTED NOT DETECTED Final   Pseudomonas aeruginosa NOT DETECTED NOT DETECTED Final   Stenotrophomonas maltophilia NOT DETECTED NOT DETECTED Final   Candida albicans NOT DETECTED NOT DETECTED Final   Candida auris NOT DETECTED NOT DETECTED Final   Candida glabrata  NOT DETECTED NOT DETECTED Final   Candida krusei NOT DETECTED NOT DETECTED Final   Candida parapsilosis NOT DETECTED NOT DETECTED Final   Candida tropicalis NOT DETECTED NOT DETECTED Final   Cryptococcus neoformans/gattii NOT DETECTED NOT DETECTED Final   Methicillin resistance mecA/C NOT DETECTED NOT DETECTED Final    Comment: Performed at [facility], [address] [place], [place] [address]   *Note: Due to a large number of  results and/or encounters for the requested time period, some results have not been displayed. A complete set of results can be found in Results Review.    Labs: CBC: Recent Labs  Lab 10/31/23 1758 11/01/23 0626 11/02/23 0329  WBC 18.3* 17.6*  --   NEUTROABS 14.6*  --   --   HGB 7.3* 6.3* 8.3*  HCT 23.4* 20.3* 25.5*  MCV 73.4* 73.3*  --   PLT 386 375  --    Basic Metabolic Panel: Recent Labs  Lab 10/31/23 1758 11/01/23 0626  NA 135 131*  K 4.2 4.1  CL 99 99  CO2 19* 22  GLUCOSE 86 53*  BUN 26* 19  CREATININE 0.87 0.71  CALCIUM  8.7* 8.0*  MG  --  2.0  PHOS  --  4.1   Liver Function Tests: Recent Labs  Lab 10/31/23 1758 11/01/23 0626  AST 226* 191*  ALT 61* 52*  ALKPHOS 143* 124  BILITOT 6.6* 6.6*  PROT 7.9 6.8  ALBUMIN  2.2* 1.7*   CBG: No results for input(s): GLUCAP in the last 168 hours.  Discharge time spent: greater than 30 minutes.  Signed: Zachary JINNY Ba, MD [facility] 11/03/2023

## 2023-11-06 ENCOUNTER — Other Ambulatory Visit: Payer: Self-pay | Admitting: Pharmacist

## 2023-11-06 LAB — CULTURE, BLOOD (ROUTINE X 2): Culture: NO GROWTH

## 2023-11-08 ENCOUNTER — Ambulatory Visit

## 2023-11-08 ENCOUNTER — Other Ambulatory Visit

## 2023-11-08 ENCOUNTER — Ambulatory Visit: Admitting: Hematology and Oncology

## 2023-11-28 ENCOUNTER — Other Ambulatory Visit

## 2023-11-28 ENCOUNTER — Ambulatory Visit: Admitting: Physician Assistant

## 2023-11-28 ENCOUNTER — Ambulatory Visit

## 2023-12-10 DEATH — deceased

## 2023-12-20 ENCOUNTER — Ambulatory Visit: Admitting: Hematology and Oncology

## 2023-12-20 ENCOUNTER — Other Ambulatory Visit

## 2023-12-20 ENCOUNTER — Ambulatory Visit

## 2024-04-30 ENCOUNTER — Ambulatory Visit: Payer: Managed Care, Other (non HMO) | Admitting: Hematology and Oncology
